# Patient Record
Sex: Female | Born: 1940
Health system: Southern US, Community
[De-identification: ages and names within clinical notes are randomized; demographics above are authoritative.]

## PROBLEM LIST (undated history)

## (undated) DIAGNOSIS — K56699 Other intestinal obstruction unspecified as to partial versus complete obstruction: Secondary | ICD-10-CM

## (undated) DIAGNOSIS — E119 Type 2 diabetes mellitus without complications: Secondary | ICD-10-CM

## (undated) DIAGNOSIS — M1990 Unspecified osteoarthritis, unspecified site: Secondary | ICD-10-CM

## (undated) DIAGNOSIS — E785 Hyperlipidemia, unspecified: Secondary | ICD-10-CM

## (undated) DIAGNOSIS — I10 Essential (primary) hypertension: Secondary | ICD-10-CM

## (undated) DIAGNOSIS — E079 Disorder of thyroid, unspecified: Secondary | ICD-10-CM

## (undated) DIAGNOSIS — Z7901 Long term (current) use of anticoagulants: Secondary | ICD-10-CM

## (undated) DIAGNOSIS — I480 Paroxysmal atrial fibrillation: Secondary | ICD-10-CM

## (undated) DIAGNOSIS — R55 Syncope and collapse: Secondary | ICD-10-CM

## (undated) DIAGNOSIS — I2699 Other pulmonary embolism without acute cor pulmonale: Secondary | ICD-10-CM

## (undated) HISTORY — PX: KNEE ARTHROSCOPY: SHX127

## (undated) HISTORY — DX: Unspecified osteoarthritis, unspecified site: M19.90

## (undated) HISTORY — DX: Other intestinal obstruction unspecified as to partial versus complete obstruction: K56.699

## (undated) HISTORY — DX: Hyperlipidemia, unspecified: E78.5

## (undated) HISTORY — PX: REDUCTION MAMMAPLASTY: SUR839

## (undated) HISTORY — DX: Other pulmonary embolism without acute cor pulmonale: I26.99

## (undated) HISTORY — DX: Paroxysmal atrial fibrillation: I48.0

## (undated) HISTORY — PX: SP ARTHRO HIP*L*: HXRAD239

## (undated) HISTORY — DX: Essential (primary) hypertension: I10

## (undated) HISTORY — DX: Long term (current) use of anticoagulants: Z79.01

## (undated) HISTORY — DX: Type 2 diabetes mellitus without complications: E11.9

---
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
## 2000-02-05 ENCOUNTER — Emergency Department (HOSPITAL_COMMUNITY): Admission: EM | Admit: 2000-02-05 | Discharge: 2000-02-05 | Payer: Self-pay | Admitting: Emergency Medicine

## 2000-08-21 ENCOUNTER — Encounter: Admission: RE | Admit: 2000-08-21 | Discharge: 2000-08-21 | Payer: Self-pay | Admitting: Family Medicine

## 2000-08-21 ENCOUNTER — Encounter: Payer: Self-pay | Admitting: Family Medicine

## 2000-10-26 ENCOUNTER — Encounter: Payer: Self-pay | Admitting: Internal Medicine

## 2000-10-26 ENCOUNTER — Ambulatory Visit (HOSPITAL_COMMUNITY): Admission: RE | Admit: 2000-10-26 | Discharge: 2000-10-26 | Payer: Self-pay | Admitting: Internal Medicine

## 2001-03-14 ENCOUNTER — Ambulatory Visit (HOSPITAL_COMMUNITY): Admission: RE | Admit: 2001-03-14 | Discharge: 2001-03-14 | Payer: Self-pay | Admitting: Internal Medicine

## 2001-03-17 ENCOUNTER — Emergency Department (HOSPITAL_COMMUNITY): Admission: EM | Admit: 2001-03-17 | Discharge: 2001-03-18 | Payer: Self-pay | Admitting: Emergency Medicine

## 2001-08-22 ENCOUNTER — Encounter: Admission: RE | Admit: 2001-08-22 | Discharge: 2001-08-22 | Payer: Self-pay | Admitting: Internal Medicine

## 2001-08-22 ENCOUNTER — Encounter: Payer: Self-pay | Admitting: Internal Medicine

## 2002-04-30 ENCOUNTER — Encounter: Payer: Self-pay | Admitting: Internal Medicine

## 2002-04-30 ENCOUNTER — Ambulatory Visit (HOSPITAL_COMMUNITY): Admission: RE | Admit: 2002-04-30 | Discharge: 2002-04-30 | Payer: Self-pay | Admitting: Internal Medicine

## 2003-02-20 ENCOUNTER — Encounter: Admission: RE | Admit: 2003-02-20 | Discharge: 2003-02-20 | Payer: Self-pay | Admitting: Internal Medicine

## 2003-04-21 ENCOUNTER — Ambulatory Visit (HOSPITAL_COMMUNITY): Admission: RE | Admit: 2003-04-21 | Discharge: 2003-04-21 | Payer: Self-pay | Admitting: Gastroenterology

## 2004-05-06 ENCOUNTER — Encounter: Admission: RE | Admit: 2004-05-06 | Discharge: 2004-05-06 | Payer: Self-pay | Admitting: Internal Medicine

## 2004-05-12 ENCOUNTER — Emergency Department (HOSPITAL_COMMUNITY): Admission: EM | Admit: 2004-05-12 | Discharge: 2004-05-12 | Payer: Self-pay | Admitting: Family Medicine

## 2005-06-17 ENCOUNTER — Encounter: Admission: RE | Admit: 2005-06-17 | Discharge: 2005-06-17 | Payer: Self-pay | Admitting: Internal Medicine

## 2006-02-22 ENCOUNTER — Emergency Department (HOSPITAL_COMMUNITY): Admission: EM | Admit: 2006-02-22 | Discharge: 2006-02-22 | Payer: Self-pay | Admitting: Emergency Medicine

## 2006-05-27 ENCOUNTER — Emergency Department (HOSPITAL_COMMUNITY): Admission: EM | Admit: 2006-05-27 | Discharge: 2006-05-27 | Payer: Self-pay | Admitting: Family Medicine

## 2006-06-22 ENCOUNTER — Encounter: Admission: RE | Admit: 2006-06-22 | Discharge: 2006-06-22 | Payer: Self-pay | Admitting: Internal Medicine

## 2007-06-25 ENCOUNTER — Encounter: Admission: RE | Admit: 2007-06-25 | Discharge: 2007-06-25 | Payer: Self-pay | Admitting: Internal Medicine

## 2008-03-07 ENCOUNTER — Inpatient Hospital Stay (HOSPITAL_COMMUNITY): Admission: RE | Admit: 2008-03-07 | Discharge: 2008-03-12 | Payer: Self-pay | Admitting: Orthopedic Surgery

## 2008-04-24 ENCOUNTER — Inpatient Hospital Stay (HOSPITAL_COMMUNITY): Admission: EM | Admit: 2008-04-24 | Discharge: 2008-04-26 | Payer: Self-pay | Admitting: Emergency Medicine

## 2008-06-25 ENCOUNTER — Encounter: Admission: RE | Admit: 2008-06-25 | Discharge: 2008-06-25 | Payer: Self-pay | Admitting: Internal Medicine

## 2009-07-23 ENCOUNTER — Encounter: Admission: RE | Admit: 2009-07-23 | Discharge: 2009-07-23 | Payer: Self-pay | Admitting: Internal Medicine

## 2010-01-24 ENCOUNTER — Encounter: Payer: Self-pay | Admitting: Internal Medicine

## 2010-04-14 LAB — GLUCOSE, CAPILLARY
Glucose-Capillary: 114 mg/dL — ABNORMAL HIGH (ref 70–99)
Glucose-Capillary: 116 mg/dL — ABNORMAL HIGH (ref 70–99)
Glucose-Capillary: 67 mg/dL — ABNORMAL LOW (ref 70–99)
Glucose-Capillary: 82 mg/dL (ref 70–99)
Glucose-Capillary: 92 mg/dL (ref 70–99)
Glucose-Capillary: 95 mg/dL (ref 70–99)

## 2010-04-14 LAB — COMPREHENSIVE METABOLIC PANEL
Alkaline Phosphatase: 55 U/L (ref 39–117)
BUN: 12 mg/dL (ref 6–23)
GFR calc Af Amer: >60 mL/min (ref >60)
Potassium: 3.9 mEq/L (ref 3.5–5.1)
Sodium: 137 mEq/L (ref 135–145)

## 2010-04-14 LAB — CBC
HCT: 29.9 % — ABNORMAL LOW (ref 36.0–46.0)
HCT: 36.9 % (ref 36.0–46.0)
Hemoglobin: 11.9 g/dL — ABNORMAL LOW (ref 12.0–15.0)
MCHC: 33.5 g/dL (ref 30.0–36.0)
MCV: 86 fL (ref 78.0–100.0)
MCV: 86.2 fL (ref 78.0–100.0)
Platelets: 314 10*3/uL (ref 150–400)
RBC: 3.48 MIL/uL — ABNORMAL LOW (ref 3.87–5.11)
RDW: 15.4 % (ref 11.5–15.5)

## 2010-04-14 LAB — BASIC METABOLIC PANEL
CO2: 25 mEq/L (ref 19–32)
Calcium: 9 mg/dL (ref 8.4–10.5)
Creatinine, Ser: 1.05 mg/dL (ref 0.4–1.2)
Potassium: 4.1 mEq/L (ref 3.5–5.1)

## 2010-04-14 LAB — POCT URINALYSIS DIP (DEVICE)
Bilirubin Urine: NEGATIVE
Hgb urine dipstick: NEGATIVE
Ketones, ur: NEGATIVE mg/dL
Protein, ur: 30 mg/dL — AB
Specific Gravity, Urine: 1.02 (ref 1.005–1.030)
pH: 8.5 — ABNORMAL HIGH (ref 5.0–8.0)

## 2010-04-14 LAB — DIFFERENTIAL
Basophils Absolute: 0.1 10*3/uL (ref 0.0–0.1)
Basophils Relative: 0 % (ref 0–1)
Eosinophils Absolute: 0.1 10*3/uL (ref 0.0–0.7)
Eosinophils Relative: 1 % (ref 0–5)
Lymphocytes Relative: 22 % (ref 12–46)
Lymphs Abs: 2.9 10*3/uL (ref 0.7–4.0)

## 2010-04-15 LAB — DIFFERENTIAL
Basophils Absolute: 0 10*3/uL (ref 0.0–0.1)
Basophils Relative: 0 % (ref 0–1)
Lymphocytes Relative: 37 % (ref 12–46)
Monocytes Absolute: 0.5 10*3/uL (ref 0.1–1.0)
Neutro Abs: 5.7 10*3/uL (ref 1.7–7.7)

## 2010-04-15 LAB — GLUCOSE, CAPILLARY
Glucose-Capillary: 102 mg/dL — ABNORMAL HIGH (ref 70–99)
Glucose-Capillary: 105 mg/dL — ABNORMAL HIGH (ref 70–99)
Glucose-Capillary: 107 mg/dL — ABNORMAL HIGH (ref 70–99)
Glucose-Capillary: 130 mg/dL — ABNORMAL HIGH (ref 70–99)
Glucose-Capillary: 141 mg/dL — ABNORMAL HIGH (ref 70–99)
Glucose-Capillary: 164 mg/dL — ABNORMAL HIGH (ref 70–99)
Glucose-Capillary: 169 mg/dL — ABNORMAL HIGH (ref 70–99)
Glucose-Capillary: 190 mg/dL — ABNORMAL HIGH (ref 70–99)
Glucose-Capillary: 24 mg/dL — CL (ref 70–99)
Glucose-Capillary: 60 mg/dL — ABNORMAL LOW (ref 70–99)
Glucose-Capillary: 64 mg/dL — ABNORMAL LOW (ref 70–99)
Glucose-Capillary: 72 mg/dL (ref 70–99)
Glucose-Capillary: 76 mg/dL (ref 70–99)
Glucose-Capillary: 79 mg/dL (ref 70–99)
Glucose-Capillary: 96 mg/dL (ref 70–99)

## 2010-04-15 LAB — COMPREHENSIVE METABOLIC PANEL
Alkaline Phosphatase: 54 U/L (ref 39–117)
BUN: 18 mg/dL (ref 6–23)
CO2: 27 mEq/L (ref 19–32)
Chloride: 104 mEq/L (ref 96–112)
Creatinine, Ser: 0.94 mg/dL (ref 0.4–1.2)
GFR calc non Af Amer: 60 mL/min — ABNORMAL LOW (ref >60)
Glucose, Bld: 128 mg/dL — ABNORMAL HIGH (ref 70–99)
Potassium: 4.3 mEq/L (ref 3.5–5.1)
Total Bilirubin: 0.8 mg/dL (ref 0.3–1.2)

## 2010-04-15 LAB — APTT: aPTT: 31 seconds (ref 24–37)

## 2010-04-15 LAB — BASIC METABOLIC PANEL
BUN: 15 mg/dL (ref 6–23)
BUN: 17 mg/dL (ref 6–23)
CO2: 25 mEq/L (ref 19–32)
Calcium: 8.5 mg/dL (ref 8.4–10.5)
Calcium: 8.7 mg/dL (ref 8.4–10.5)
Chloride: 103 mEq/L (ref 96–112)
Chloride: 103 mEq/L (ref 96–112)
Creatinine, Ser: 0.97 mg/dL (ref 0.4–1.2)
GFR calc Af Amer: >60 mL/min (ref >60)
GFR calc Af Amer: >60 mL/min (ref >60)
GFR calc non Af Amer: 46 mL/min — ABNORMAL LOW (ref >60)
GFR calc non Af Amer: 50 mL/min — ABNORMAL LOW (ref >60)
GFR calc non Af Amer: 56 mL/min — ABNORMAL LOW (ref >60)
Glucose, Bld: 79 mg/dL (ref 70–99)
Potassium: 4.2 mEq/L (ref 3.5–5.1)
Potassium: 4.9 mEq/L (ref 3.5–5.1)
Sodium: 133 mEq/L — ABNORMAL LOW (ref 135–145)
Sodium: 134 mEq/L — ABNORMAL LOW (ref 135–145)
Sodium: 137 mEq/L (ref 135–145)

## 2010-04-15 LAB — PROTIME-INR
INR: 1.6 — ABNORMAL HIGH (ref 0.00–1.49)
INR: 2 — ABNORMAL HIGH (ref 0.00–1.49)
INR: 2.3 — ABNORMAL HIGH (ref 0.00–1.49)
Prothrombin Time: 13.4 seconds (ref 11.6–15.2)
Prothrombin Time: 27.2 seconds — ABNORMAL HIGH (ref 11.6–15.2)
Prothrombin Time: 28.6 seconds — ABNORMAL HIGH (ref 11.6–15.2)

## 2010-04-15 LAB — URINALYSIS, ROUTINE W REFLEX MICROSCOPIC
Bilirubin Urine: NEGATIVE
Glucose, UA: NEGATIVE mg/dL
Ketones, ur: NEGATIVE mg/dL
Protein, ur: NEGATIVE mg/dL
pH: 5 (ref 5.0–8.0)

## 2010-04-15 LAB — URINE MICROSCOPIC-ADD ON

## 2010-04-15 LAB — CBC
HCT: 34.5 % — ABNORMAL LOW (ref 36.0–46.0)
HCT: 40.4 % (ref 36.0–46.0)
Hemoglobin: 10.1 g/dL — ABNORMAL LOW (ref 12.0–15.0)
Hemoglobin: 11 g/dL — ABNORMAL LOW (ref 12.0–15.0)
Hemoglobin: 11.4 g/dL — ABNORMAL LOW (ref 12.0–15.0)
Hemoglobin: 13.4 g/dL (ref 12.0–15.0)
MCHC: 33.6 g/dL (ref 30.0–36.0)
MCV: 87.3 fL (ref 78.0–100.0)
MCV: 87.7 fL (ref 78.0–100.0)
MCV: 88.4 fL (ref 78.0–100.0)
Platelets: 190 10*3/uL (ref 150–400)
Platelets: 199 10*3/uL (ref 150–400)
Platelets: 226 10*3/uL (ref 150–400)
RBC: 3.73 MIL/uL — ABNORMAL LOW (ref 3.87–5.11)
RBC: 3.9 MIL/uL (ref 3.87–5.11)
RBC: 4.61 MIL/uL (ref 3.87–5.11)
RDW: 14.4 % (ref 11.5–15.5)
RDW: 14.4 % (ref 11.5–15.5)
WBC: 10.2 10*3/uL (ref 4.0–10.5)
WBC: 11.4 10*3/uL — ABNORMAL HIGH (ref 4.0–10.5)
WBC: 13.3 10*3/uL — ABNORMAL HIGH (ref 4.0–10.5)

## 2010-04-15 LAB — TYPE AND SCREEN: Antibody Screen: NEGATIVE

## 2010-04-15 LAB — TSH: TSH: 0.659 u[IU]/mL (ref 0.350–4.500)

## 2010-04-15 LAB — ABO/RH: ABO/RH(D): O POS

## 2010-05-18 NOTE — Op Note (Signed)
Hannah [name], Hannah [name]                ACCOUNT NO.:  [id number]   MEDICAL RECORD NO.:  [id number]          PATIENT TYPE:  INP   LOCATION:  5023                         FACILITY:  [facility]   PHYSICIAN:  Harvie Junior, M.D.   DATE OF BIRTH:  1940/05/03   DATE OF PROCEDURE:  03/07/2008  DATE OF DISCHARGE:                               OPERATIVE REPORT   PREOPERATIVE DIAGNOSIS:  End-stage degenerative joint disease, left  knee.   POSTOPERATIVE DIAGNOSIS:  End-stage degenerative joint disease, left  knee.   PROCEDURE:  1. Left total knee replacement with Sigma system size 3 femur, size 3      tibia, 12.5-mm bridging bearing, and a 35-mm all-polyethylene      patella.  2. Computer-assisted left total knee replacement.   SURGEON:  Harvie Junior, MD.   ASSISTANT:  Marshia Ly, P.A.   ANESTHESIA:  General.   BRIEF HISTORY:  Hannah [name] is a 70 year old female with long history of  having had severe left degenerative joint disease.  She had a previous  left total hip replacement and previous right knee replacement.  She is  doing well except for the left knee hurting her.  We treated with  injection therapy, activity modification, anti-inflammatory medication,  aggressive care as well as conservative care.  She was taken to the  operating room for left total knee replacement.  Because of her total  hip, we knew that we could use intramedullary alignment until we felt  that computer assistance is going to be necessary to get appropriate  long alignment.  This was chosen to be used preoperatively.   PROCEDURE:  The patient was taken to the operating room.  After adequate  anesthesia was obtained with a general anesthetic, the patient was  placed supine on the operative table.  The left leg was prepped and  draped in the usual sterile fashion.  Following this, the leg was  exsanguinated.  Blood pressure tourniquet was inflated to 350 mmHg.  Following this, the midline incision was made  and subcutaneous tissue  dissected down to the level of the extensor mechanism.  Medial  parapatellar arthrotomy was undertaken.  Following this, the anterior  and posterior cruciates were excised as well as medial and lateral  meniscus and retropatellar fat pad.  The computer models were then  placed, 2 pins in the tibia, 2 pins in the femur and once this was done,  we did a registration process.  I had spent 30 minutes to the overall  surgical procedure.  Once the registration process was undertaken, we  cut the tibia perpendicular to its long axis and once this was  completed, the attention was then turned to the distal femur, which was  cut perpendicular to the anatomic axis.  This anatomic axis was found by  the computer assistance modules and once this was completed, attention  was turned towards placing a spacer block, which seemed a 10 easily went  into the knee.  At this point, attention was turned towards making the  cuts on the femur.  We did size it to  a 3 and anterior and posterior  cuts and chamfers, neutral rotation.  Once this was completed, attention  was turned towards the knee where the tibia was then sized, pinned and  keeled.  Following this, the trial of tibial component #3 was put in  place, 10-mm bridging bearing, size 3 femoral component, after the  blocks had previously been cut.  This gave excellent alignment.  Once  this was completed, there was a perfect neutral long alignment with the  guide system.  Once this was completed, attention was turned to the  patella, which was sized to a 35.  The patella was cut down to level of  14 mm and then the lugs were drilled for the patella.  Once this was  completed, the final components were put in place as well as the  polyethylene spacer and perfect neutral long alignment was achieved.  The trial components were then removed.  The knee was copiously and  thoroughly lavaged with a pulsatile lavage irrigation and final   components were cemented into place, size 3 tibia, size 3 femur, and a  10-mm bridging bearing trial was placed and seemed a little bit looser  with the final component, but this was placed to allow the cement to  harden completely, and the polyethylene patella was put in place and a  clamp was put in place.  Once this was completed, the cement was allowed  to hardened completely, and we then took a trial component out.  There  was a little bit of clean-up on the posterior aspect of the knee, behind  knee implant and then trialed with a 12.5 poly.  This actually seemed to  give still perfect full neutral long alignment and perfect neutral long  alignment and balance.  Once this was completed, the wound was copiously  and thoroughly irrigated and suctioned dry.  The tourniquet then let  down and all bleeders controlled with electrocautery.  Instilled 10 mL  of Marcaine with epinephrine in the posterior aspect of the knee to help  control bleeding and decrease pain.  The knee was then put through a  full range of motion without significant hold off of alignment.  The  medial parapatellar arthrotomy was closed with a #1 Vicryl running  suture, the skin with #0 and 2-0 Vicryl, and skin with staples.  Sterile  compressive dressing was applied, and the patient was taken to the  recovery room.  She was noted to be in a satisfactory condition.  Estimated blood loss for this procedure was less than 50 mL.      Harvie Junior, M.D.  Electronically Signed     JLG/MEDQ  D:  03/07/2008  T:  03/08/2008  Job:  [id number]

## 2010-05-18 NOTE — Discharge Summary (Signed)
NAMEEFRAT, ZUIDEMA                ACCOUNT NO.:  [id number]   MEDICAL RECORD NO.:  [id number]          PATIENT TYPE:  INP   LOCATION:  5023                         FACILITY:  [facility]   PHYSICIAN:  Harvie Junior, M.D.   DATE OF BIRTH:  June 06, 1940   DATE OF ADMISSION:  03/07/2008  DATE OF DISCHARGE:  03/12/2008                               DISCHARGE SUMMARY   ADMITTING DIAGNOSES:  1. End-stage degenerative joint disease left knee.  2. Type 2 diabetes mellitus.  3. Hypertension.  4. Hyperlipidemia.  5. Hypothyroidism.  6. History of deep venous thrombosis left lower extremity in past.   DISCHARGE DIAGNOSES:  1. End-stage degenerative joint disease left knee.  2. Type 2 diabetes mellitus.  3. Hypertension.  4. Hyperlipidemia.  5. Hypothyroidism.  6. Acute blood loss anemia.  7. Tachycardia.  8. History of deep venous thrombosis left lower extremity in the past.   HOSPITAL PROCEDURES:  Left total knee arthroplasty computer assisted  Harvie Junior, M.D. March 07, 2008.   HOSPITAL CONSULTATIONS:  Cardiology [facility].   BRIEF HISTORY:  Hannah [name] is a 70 year old female who has a long  history of left knee pain.  Standing x-rays of the left knee showed bone-  on-bone degenerative arthritis laterally with valgus angulation.  She  has gotten no relief with conservative treatment including injection  therapy, modification of activity and use of medication.  Based upon her  clinical and radiographic findings she is felt to be a candidate for a  left total knee arthroplasty and is admitted for this.   PERTINENT LABORATORY STUDIES:  CT angiography of the chest on March 11, 2008, showed no evidence of acute pulmonary embolism, mild bibasilar  linear atelectasis.  X-ray of the left hip showed no acute findings.  Preoperative chest x-ray on February 28, 2008, showed no active  cardiopulmonary disease.  EKG on February 28, 2008, showed normal sinus  rhythm  with left axis deviation and left ventricular hypertrophy with  repolarization abnormality.  No significant change since last tracing.  EKG on March 10, 2008, showed sinus tachycardia with left axis deviation,  minimal cultures and voltage criteria for LVH, may be normal variant.  Hemoglobin on admission was 13.4, hematocrit 40.4, WBC 10.2.  On postop  day 1 hemoglobin 11.4, postop day 2 was 10.1, postop day 3 is 10.0.  CMET on admission was within normal limits other than slightly elevated  glucose at 128.  This was followed on a daily basis and was found to be  within normal limits through the hospitalization.  The patient's pro-  time on admission was 13.4 seconds with an INR of 1.0, PTT of 31.  On  the day of discharge on Coumadin therapy orally her pro-time was 27.2  seconds with an INR of 2.3.  CBGs ranged from 60 to 119 during this  hospitalization.  She did have a CBG on March 8 of 49.  Also she had a  CBG up to 146 early in the hospitalization.  Preoperative urinalysis  showed few bacteria with 7-10 WBCs per  high-powered field.  CMET on  admission was within normal limits.  TSH on March 11, 2008, was 0.659.   HOSPITAL COURSE:  The patient was brought to the hospital where she  underwent left total knee arthroplasty as well described in Dr. Stacy Gardner  operative note.  Preoperatively she was given 2 grams of Ancef and 80 mg  of gentamicin was given, 1 gram Ancef q.8 h times 24 hours  postoperatively.  She was started on Coumadin for DVT prophylaxis  postop.  She did have a history of DVT since she was given 2 days with  subcu Lovenox until her INR was close to 2.0.  The patient was without  complaints.  She was on a PCA morphine pump and IV fluids.  CPM machine  was used and physical therapy was ordered for walker ambulation,  weightbearing as tolerated on the left.  On postop day #1 the patient  was without significant complaints other than moderate knee pain.  Her  hemoglobin was  stable at 13.4 and drain was in place.  Her Foley  catheter that was placed at the time of surgery was discontinued.  She  was gotten out of bed with physical therapy.  She was overall  comfortable.  She was taking fluids without difficulty.  Her dressing  was changed and her Hemovac drain was pulled.  Her INR was 1.6.  Her  potassium was 4.2.  She was continued with therapy.  On postop #3 she  was noted to be significantly tachycardic with a pulse of 114 up to  about 120.  She was given a liter of fluid bolus.  An EKG was obtained  which showed sinus tachycardia with no abnormalities.  She had some  complaints of some left hip pain and she was status post left total hip.  X-rays of the left hip were taken which were unremarkable.  She  continued with physical therapy on p.o. Coumadin.  She continued to be  tachycardic despite use of fluid bolus and a cardiology consult was  obtained.  This was greatly appreciated.  CT scan of the chest was done  which was negative for pulmonary embolus and she was started on a beta-  blocker.  A thyroid study was obtained as well which was within normal  limits.  The patient had no complaints of chest pain or shortness of  breath during this hospitalization but she continued to be significantly  tachycardic.  She improved with the use of beta blockade regarding her  heart rate and cardiology felt she was okay for discharge on March 12, 2008.  She was to go home on Lopressor 25 mg b.i.d.  Regarding her knee  her dressing was clean and dry at the time of discharge.  Her calf was  soft and nontender.  Her INR was 2.3.  She was discharged to a skilled  nursing facility in improved condition.  She will be on a diabetic diet.  Her activity status will be ambulating with a walker, weightbearing as  tolerated on the left.  She will do aggressive left knee range of motion  with the use of a CPM 8 hours per 24 hours with goal of flexion up to  120 degrees.    DISCHARGE MEDICATIONS:  1. Her medications at discharge will include metformin 500 mg one      b.i.d.  2. Lisinopril/HCTZ 20/25 mg one q.a.m.  3. Glipizide 5 mg b.i.d.  4. Levothyroxine 50 mcg q.a.m.  5.  Percocet 5 mg one to two q.6 h p.r.n. pain.  6. Simvastatin 20 mg one q. a.m.  7. Vitamin C 500 mg one q. a.m.  8. Metoprolol 25 mg 1 b.i.d.  9. Coumadin per pharmacy protocol shooting for an INR of 2.0.   FOLLOWUP:  She will need a followup with Dr. Luiz Blare in his office in 10  days.  Our office number is [phone].  Follow up with Dr. Lynnea Ferrier of  cardiology as needed and Dr. Concepcion Elk for followup in 3 weeks.      Marshia Ly, P.A.      Harvie Junior, M.D.  Electronically Signed    JB/MEDQ  D:  03/12/2008  T:  03/12/2008  Job:  [id number]   cc:   Ritta Slot, MD  Fleet Contras, M.D.

## 2010-05-18 NOTE — Discharge Summary (Signed)
[name], [name]                ACCOUNT NO.:  [id number]   MEDICAL RECORD NO.:  [id number]          PATIENT TYPE:  INP   LOCATION:  5151                         FACILITY:  [facility]   PHYSICIAN:  Fleet Contras, M.D.    DATE OF BIRTH:  1940/01/31   DATE OF ADMISSION:  04/24/2008  DATE OF DISCHARGE:  04/26/2008                               DISCHARGE SUMMARY   HISTORY OF PRESENT ILLNESS:  Hannah [name] is a 70 year old African  American lady with past medical history significant for systemic  hypertension, hypothyroidism, and degenerative joint disease of the  knees, status post recent left total knee replacement.  She was admitted  via the emergency room at [facility] where she presented with 3  days history of left lower quadrant abdominal pain, progressively  getting worse after an episode of alternating diarrhea and constipation,  and then she ate some meatballs from [employer].  She did not have any  fevers.  She had no nausea or vomiting.  She does have constipation  symptoms, were probably due to medication she was taking after her left  total knee replacement done on March 07, 2008.  At emergency room, she  was noted to be in moderate-to-severe painful distress.  She was not  pale.  She was not icteric.  She was not cyanosed.  Abdomen was tender  in the left lower quadrant.  CT scan of the abdomen with findings  suggestive of sigmoid diverticulitis.  She also had an elevated white  count of 13.6 and because of her severity of her pain, she was admitted  to the hospital for intravenous antibiotics and close monitoring.   HOSPITAL COURSE:  On admission, the patient was started on intravenous  fluids, intravenous morphine 1-2 mg q.4 h. for pain and intravenous  Zofran 4 mg q.4 h. p.r.n. for nausea and vomiting.  Her diet was slowly  advanced as tolerated.  She was started on intravenous antibiotics with  Rocephin 1 g q.24 h. and metronidazole 500 mg q.8 h.  She was on  Protonix 40  mg IV daily.  DVT prophylaxis with subcu Lovenox 40 mg  daily.  Her symptoms improved with resolution of her pain and she was  able to tolerate full diet.  Metformin was put on hold as result of her  recent contrast injection from CT scan of the abdomen and CBGs were  monitored a.c. and h.s. and covered with sliding scale NovoLog.  Today,  she is feeling much better.  She is tolerating a full diet.  She has had  no abdominal pain, nausea, or vomiting.  She has, however, not had a  bowel movement for 24 hours.  She has been given some Dulcolax 2 tablets  to assist her bowel movement.   PHYSICAL EXAMINATION:  VITAL SIGNS:  Stable.  Her blood pressure is  124/64, heart rate of 82, respiratory rate of 18, temperature 99.8, and  O2 sats on room air is 95%.  CHEST:  Clear to auscultation.  HEART:  Sounds 1 or 2 are heard.  No murmurs.  ABDOMEN:  Obese,  soft, and nontender.  No masses.  Bowel sounds are  present.  EXTREMITIES:  No edema or calf tenderness or swelling.  CNS:  She is alert and oriented x3 with no focal neurological deficits.   LABORATORY DATA:  On April 25, 2008, shows white count of 9.4,  hemoglobin 10, hematocrit 29.9, and platelet count of 255.  Sodium was  137, potassium 4.1, chloride 103, bicarbonate of 25, BUN 10, creatinine  1.05, and glucose of 87.  Ultrasound scan of the abdomen was performed  and was shown not to have any evidence of gallstones.  She had no  biliary duct dilatation, but she had the fatty liver.  She is now  considered stable for discharge home.   DISCHARGE DIAGNOSES:  Sigmoid diverticulitis, acute.  She actually had a  colonoscopy done about 5 years ago by Dr. Charna Elizabeth that showed  scattered diverticulosis.   CONDITION ON DISCHARGE:  Stable.   DISPOSITION:  For home.   DISCHARGE MEDICATIONS:  1. Flagyl 500 mg p.o. t.i.d.  2. Metformin 500 mg p.o. b.i.d.  3. Glucotrol 5 mg p.o. b.i.d.  4. Hydrochlorothiazide 25 mg p.o. daily.  5. Lopressor  25 mg p.o. b.i.d.  6. Prinivil 20 mg p.o. daily.  7. Synthroid 50 mcg p.o. daily.  8. Ceftin 500 mg p.o. b.i.d. for 7 days.  9. Trinsicon 1 capsule p.o. b.i.d.  10.Zocor 20 mg p.o. daily.  11.Percocet 5/325 one p.o. q.6 h. p.r.n. for pain.   This discharge plan was discussed with her and her daughter and their  questions answered.      Fleet Contras, M.D.  Electronically Signed     EA/MEDQ  D:  04/26/2008  T:  04/27/2008  Job:  [id number]

## 2010-05-18 NOTE — H&P (Signed)
Hannah [name], Hannah [name]                ACCOUNT NO.:  [id number]   MEDICAL RECORD NO.:  [id number]          PATIENT TYPE:  INP   LOCATION:  5151                         FACILITY:  [facility]   PHYSICIAN:  Fleet Contras, M.D.    DATE OF BIRTH:  1940-08-29   DATE OF ADMISSION:  04/24/2008  DATE OF DISCHARGE:                              HISTORY & PHYSICAL   PRESENTING COMPLAINT:  Left lower quadrant abdominal pain.   HISTORY OF PRESENT ILLNESS:  Hannah [name] is a 70 year old African  American lady with multiple medical problems including systemic  hypertension, type 2 diabetes mellitus, hypothyroidism, dyslipidemia,  degenerative arthritis of the hips and knees, deep venous thrombosis of  the left leg.  She came to the emergency room at [facility]  with 3-day history of progressively worsening recurrent left lower  quadrant abdominal pain.  She stated that the pain started after she ate  some meatballs she got from [employer], but she did not really have much  appetite but she did not have any vomiting.  She stated her bowels have  been moving regularly after she had some episodes of alternating  diarrhea and constipation while recuperating from her recent left knee  replacement surgery done on March 07, 2008.  While at the nursing home,  she did take  some milk of magnesia for constipation and that gave her  some diarrhea but that has resolved since she has been home where she  lives by herself.  She called her daughter when this pain started, and  she was advised to go to the Urgent Care where  she was sent to the  emergency room for further eval.  In the emergency room, she was noted  to be in severe left lower quadrant abdominal pain but no vomiting and  no diarrhea.  CT scan of the abdomen was performed and revealed findings  suggestive of sigmoid diverticulitis.  She also had a slightly elevated  white count of 13.6.  Because of the severity of her pain and  leukocytosis, it was thought  best for her to be admitted to the hospital  for close monitoring on intravenous antibiotic therapy.   PAST MEDICAL HISTORY:  1. Degenerative joint disease of the knees status post left knee total      hip replacement.  2. Type 2 diabetes mellitus.  3. Systemic hypertension.  4. Hyperlipidemia.  5. Hypothyroidism.  6. History of deep venous thrombosis of the left leg.  7. History of sinus tachycardia during her last admission for knee      replacement.   A CT scan of the chest was performed at that time and  pulmonary  embolism was ruled out.  She was then treated with beta-blocker by  Cardiology.   MEDICATION HISTORY:  She is on:  1. Metformin 500 mg b.i.d.  2. Lisinopril/hydrochlorothiazide 20/25 one p.o. daily.  3. Glipizide 5 mg b.i.d.  4. Levothyroxine 50 mcg q.a.m.  5. Simvastatin 20 mg q.a.m.  6. Metoprolol 25 mg b.i.d.  7. Lasix 20 mg daily.  8. Darvocet-N 100 one p.o. q.12 p.r.n.  9. Trinsicon  1 p.o. b.i.d.   ALLERGIES:  She has no known drug allergies.   PAST SURGICAL HISTORY:  Left hip replacement and left total knee  replacement.   FAMILY AND SOCIAL HISTORY:  She lives by herself.  She denies any use of  alcohol, tobacco, or illicit drugs.   REVIEW OF SYSTEMS:  Essentially as above.  CARDIOVASCULAR:  She has no  chest pain, shortness of breath, orthopnea, or PND.  RESPIRATORY:  She  has no cough, sputum production, or hemoptysis.  NEUROLOGIC:  She has no  syncope, seizures, slurring of speech, headaches, weakness of  extremities.  GU:  No dysuria, frequency, or hematuria.  MUSCULOSKELETAL:  She is currently receiving physical therapy status  post left total knee replacement and she went for therapy about 3 days  ago.   PHYSICAL EXAMINATION:  She is lying in hospital bed, currently not in  acute respiratory or painful distress.  She is not pale.  She is not  icteric.  She is well hydrated.  She is not cyanosed.  Vital signs shows  a blood pressure of  124/64, heart rate of 72, respiratory rate of 20,  temperature 98.4, O2 sats on room air is 98%.  Her chest is clear to  auscultation.  Heart sounds S1 and S2 were heard with no murmurs.  Abdomen is obese, soft.  He has tenderness over the left lower quadrant  with no guarding or rebound.  Bowel sounds are present.  Extremities  shows no edema, calf tenderness, or swelling.  There is evidence of  bilateral total knee replacement of both knees with mild swelling.  There is no obvious tenderness or deformity.   LABORATORY DATA:  White count is 13.6, hemoglobin 11.9, hematocrit 36.9,  platelet count of 314.  Sodium is 137, potassium 3.9, chloride 100,  bicarbonate of 24, BUN is 12, creatinine 0.96, glucose is 115, lipase is  16.  Liver function test is essentially normal.  Calcium is 9.6.  Urinalysis is negative for leukocyte esterase or nitrites.  CT scan of  the abdomen shows mild fatty infiltration of the liver, I cannot exclude  small gallstones.  CT scan of the pelvis shows probable mild  diverticulitis of the sigmoid colon.   ASSESSMENT:  Hannah [name] is a 70 year old African American lady  admitted via the emergency room with complaints of left lower quadrant  abdominal pain of 3 days' duration associated with alternating  constipation and diarrhea.  She has no fevers but has a mild  leukocytosis, admitted for intravenous antibiotics and close monitoring.   ADMISSION DIAGNOSES:  1. Acute sigmoid diverticulitis.  2. Abdominal pain.   PLAN OF CARE:  She will be placed on 2 g sodium carb-modified medium  diet, advanced as tolerated.  Vital signs q.4 hours, CBGs a.c. and h.s.,  monitor Is and Os.  Home medication will be continued as above.  1. IV fluids with normal saline at 35 mL an hour.  2. IV metronidazole 500  mg q.8 hours.  3. IV Rocephin 1 g q.24 hours.  4. IV Protonix 40 mg daily.  5. IV Zofran 4 mg q.4 hours p.r.n. for nausea and vomiting.  6. IV morphine 1-2 mg q.4  hours p.r.n. for pain.  7. Subcu Lovenox 40 mg daily for DVT prophylaxis.   Decision of care has been discussed with her and her questions answered.      Fleet Contras, M.D.  Electronically Signed     EA/MEDQ  D:  04/24/2008  T:  04/25/2008  Job:  [id number]

## 2010-05-21 NOTE — Op Note (Signed)
NAME:  Hannah [name], Hannah [name]                          ACCOUNT NO.:  [id number]   MEDICAL RECORD NO.:  [id number]                   PATIENT TYPE:  AMB   LOCATION:  ENDO                                 FACILITY:  [facility]   PHYSICIAN:  Anselmo Rod, M.D.               DATE OF BIRTH:  November 08, 1940   DATE OF PROCEDURE:  04/21/2003  DATE OF DISCHARGE:  04/21/2003                                 OPERATIVE REPORT   PROCEDURE PERFORMED:  Colonoscopy.   ENDOSCOPIST:  Charna Elizabeth, M.D.   INSTRUMENT USED:  Olympus video colonoscope.   INDICATIONS FOR PROCEDURE:  The patient is a 70 year old female undergoing  screening colonoscopy.  Rule out colonic polyps, masses, etc.   PREPROCEDURE PREPARATION:  Informed consent was procured from the patient.  The patient was fasted for eight hours prior to the procedure and prepped  with a bottle of magnesium citrate and a gallon of GoLYTELY the night prior  to the procedure.   PREPROCEDURE PHYSICAL:  The patient had stable vital signs.  Neck supple.  Chest clear to auscultation.  S1 and S2 regular.  Abdomen soft with normal  bowel sounds.   DESCRIPTION OF PROCEDURE:  The patient was placed in left lateral decubitus  position and sedated with 75 mg of Demerol and 7.5 mg of Versed  intravenously in slow incremental doses.  Once the patient was adequately  sedated and maintained on low flow oxygen and continuous cardiac monitoring,  the Olympus video colonoscope was advanced from the rectum to the cecum.  There was some residual stool in the colon and multiple washes were done.  Except for a few scattered diverticula, no other abnormalities were noted.  No masses or polyps were present.  The patient tolerated the procedure well  without immediate complications.   IMPRESSION:  1. Few scattered diverticula, otherwise unrevealing colonoscopy.  2. There was some residual stool in the colon and small lesions could have     been missed.   RECOMMENDATIONS:  1.  Continue high fiber diet with liberal fluid intake.  2. Repeat colorectal cancer screening is recommended in the next 10 years     unless the patient develops any abnormal symptoms in the interim.  3. Outpatient followup as need arises in the future.                                               Anselmo Rod, M.D.    JNM/MEDQ  D:  04/28/2003  T:  04/28/2003  Job:  [id number]   cc:   Olene Craven, M.D.  [address]  [address]  [place]  [place] [address]  Fax: [phone]

## 2010-08-23 ENCOUNTER — Other Ambulatory Visit: Payer: Self-pay | Admitting: Cardiovascular Disease

## 2010-08-23 ENCOUNTER — Other Ambulatory Visit: Payer: Self-pay | Admitting: Internal Medicine

## 2010-08-23 DIAGNOSIS — Z1231 Encounter for screening mammogram for malignant neoplasm of breast: Secondary | ICD-10-CM

## 2010-09-15 ENCOUNTER — Ambulatory Visit
Admission: RE | Admit: 2010-09-15 | Discharge: 2010-09-15 | Disposition: A | Discharge status: Home / Self Care | Payer: MEDICARE | Source: Ambulatory Visit | Attending: Cardiovascular Disease | Admitting: Cardiovascular Disease

## 2010-09-15 DIAGNOSIS — Z1231 Encounter for screening mammogram for malignant neoplasm of breast: Secondary | ICD-10-CM

## 2011-09-08 ENCOUNTER — Other Ambulatory Visit: Payer: Self-pay | Admitting: Cardiovascular Disease

## 2011-09-08 DIAGNOSIS — Z1231 Encounter for screening mammogram for malignant neoplasm of breast: Secondary | ICD-10-CM

## 2011-10-04 ENCOUNTER — Ambulatory Visit
Admission: RE | Admit: 2011-10-04 | Discharge: 2011-10-04 | Disposition: A | Discharge status: Home / Self Care | Payer: Medicare Other | Source: Ambulatory Visit | Attending: Cardiovascular Disease | Admitting: Cardiovascular Disease

## 2011-10-04 DIAGNOSIS — Z1231 Encounter for screening mammogram for malignant neoplasm of breast: Secondary | ICD-10-CM

## 2012-07-13 ENCOUNTER — Emergency Department (HOSPITAL_COMMUNITY): Payer: Medicare Other

## 2012-07-13 ENCOUNTER — Encounter (HOSPITAL_COMMUNITY): Payer: Self-pay | Admitting: Emergency Medicine

## 2012-07-13 ENCOUNTER — Inpatient Hospital Stay (HOSPITAL_COMMUNITY)
Admission: EM | Admit: 2012-07-13 | Discharge: 2012-07-26 | DRG: 166 | Disposition: A | Discharge status: Home Health | Payer: Medicare Other | Attending: Emergency Medicine | Admitting: Emergency Medicine

## 2012-07-13 DIAGNOSIS — I469 Cardiac arrest, cause unspecified: Secondary | ICD-10-CM | POA: Diagnosis present

## 2012-07-13 DIAGNOSIS — I824Y9 Acute embolism and thrombosis of unspecified deep veins of unspecified proximal lower extremity: Secondary | ICD-10-CM | POA: Diagnosis present

## 2012-07-13 DIAGNOSIS — J9600 Acute respiratory failure, unspecified whether with hypoxia or hypercapnia: Secondary | ICD-10-CM | POA: Diagnosis present

## 2012-07-13 DIAGNOSIS — J9601 Acute respiratory failure with hypoxia: Secondary | ICD-10-CM

## 2012-07-13 DIAGNOSIS — R578 Other shock: Secondary | ICD-10-CM | POA: Diagnosis present

## 2012-07-13 DIAGNOSIS — E162 Hypoglycemia, unspecified: Secondary | ICD-10-CM

## 2012-07-13 DIAGNOSIS — I10 Essential (primary) hypertension: Secondary | ICD-10-CM | POA: Diagnosis present

## 2012-07-13 DIAGNOSIS — R092 Respiratory arrest: Secondary | ICD-10-CM

## 2012-07-13 DIAGNOSIS — Z86718 Personal history of other venous thrombosis and embolism: Secondary | ICD-10-CM

## 2012-07-13 DIAGNOSIS — E876 Hypokalemia: Secondary | ICD-10-CM | POA: Diagnosis not present

## 2012-07-13 DIAGNOSIS — I2699 Other pulmonary embolism without acute cor pulmonale: Secondary | ICD-10-CM

## 2012-07-13 DIAGNOSIS — J9819 Other pulmonary collapse: Secondary | ICD-10-CM | POA: Diagnosis present

## 2012-07-13 DIAGNOSIS — D696 Thrombocytopenia, unspecified: Secondary | ICD-10-CM | POA: Diagnosis present

## 2012-07-13 DIAGNOSIS — E669 Obesity, unspecified: Secondary | ICD-10-CM | POA: Diagnosis present

## 2012-07-13 DIAGNOSIS — N179 Acute kidney failure, unspecified: Secondary | ICD-10-CM

## 2012-07-13 DIAGNOSIS — R5381 Other malaise: Secondary | ICD-10-CM | POA: Diagnosis present

## 2012-07-13 DIAGNOSIS — K7200 Acute and subacute hepatic failure without coma: Secondary | ICD-10-CM | POA: Diagnosis present

## 2012-07-13 DIAGNOSIS — E119 Type 2 diabetes mellitus without complications: Secondary | ICD-10-CM

## 2012-07-13 DIAGNOSIS — G9340 Encephalopathy, unspecified: Secondary | ICD-10-CM | POA: Diagnosis present

## 2012-07-13 DIAGNOSIS — J384 Edema of larynx: Secondary | ICD-10-CM | POA: Diagnosis not present

## 2012-07-13 DIAGNOSIS — R791 Abnormal coagulation profile: Secondary | ICD-10-CM | POA: Diagnosis present

## 2012-07-13 DIAGNOSIS — E039 Hypothyroidism, unspecified: Secondary | ICD-10-CM | POA: Diagnosis present

## 2012-07-13 DIAGNOSIS — Z7901 Long term (current) use of anticoagulants: Secondary | ICD-10-CM

## 2012-07-13 DIAGNOSIS — I369 Nonrheumatic tricuspid valve disorder, unspecified: Secondary | ICD-10-CM

## 2012-07-13 DIAGNOSIS — E872 Acidosis, unspecified: Secondary | ICD-10-CM | POA: Diagnosis present

## 2012-07-13 DIAGNOSIS — J4489 Other specified chronic obstructive pulmonary disease: Secondary | ICD-10-CM | POA: Diagnosis present

## 2012-07-13 DIAGNOSIS — Z7982 Long term (current) use of aspirin: Secondary | ICD-10-CM

## 2012-07-13 DIAGNOSIS — Z79899 Other long term (current) drug therapy: Secondary | ICD-10-CM

## 2012-07-13 DIAGNOSIS — R570 Cardiogenic shock: Secondary | ICD-10-CM

## 2012-07-13 DIAGNOSIS — D62 Acute posthemorrhagic anemia: Secondary | ICD-10-CM | POA: Diagnosis present

## 2012-07-13 DIAGNOSIS — J449 Chronic obstructive pulmonary disease, unspecified: Secondary | ICD-10-CM | POA: Diagnosis present

## 2012-07-13 DIAGNOSIS — M7981 Nontraumatic hematoma of soft tissue: Secondary | ICD-10-CM | POA: Diagnosis not present

## 2012-07-13 DIAGNOSIS — E1169 Type 2 diabetes mellitus with other specified complication: Secondary | ICD-10-CM | POA: Diagnosis not present

## 2012-07-13 HISTORY — DX: Disorder of thyroid, unspecified: E07.9

## 2012-07-13 HISTORY — DX: Other pulmonary embolism without acute cor pulmonale: I26.99

## 2012-07-13 LAB — FIBRINOGEN: Fibrinogen: <60 mg/dL — CL (ref 204–475)

## 2012-07-13 LAB — PROTIME-INR
INR: 2.17 — ABNORMAL HIGH (ref 0.00–1.49)
INR: 4.26 — ABNORMAL HIGH (ref 0.00–1.49)
INR: >10 (ref 0.00–1.49)
Prothrombin Time: >90 seconds — ABNORMAL HIGH (ref 11.6–15.2)
Prothrombin Time: 79.7 seconds — ABNORMAL HIGH (ref 11.6–15.2)
Prothrombin Time: 27.8 seconds — ABNORMAL HIGH (ref 11.6–15.2)

## 2012-07-13 LAB — CBC
HCT: 28.5 % — ABNORMAL LOW (ref 36.0–46.0)
Hemoglobin: 9.4 g/dL — ABNORMAL LOW (ref 12.0–15.0)
MCH: 28.4 pg (ref 26.0–34.0)
MCHC: 33 g/dL (ref 30.0–36.0)
Platelets: 128 10*3/uL — ABNORMAL LOW (ref 150–400)
RBC: 3.81 MIL/uL — ABNORMAL LOW (ref 3.87–5.11)
RBC: 3.31 MIL/uL — ABNORMAL LOW (ref 3.87–5.11)
RDW: 14.7 % (ref 11.5–15.5)
WBC: 19.6 10*3/uL — ABNORMAL HIGH (ref 4.0–10.5)

## 2012-07-13 LAB — COMPREHENSIVE METABOLIC PANEL
BUN: 24 mg/dL — ABNORMAL HIGH (ref 6–23)
CO2: 16 mEq/L — ABNORMAL LOW (ref 19–32)
Calcium: 8.8 mg/dL (ref 8.4–10.5)
Chloride: 100 mEq/L (ref 96–112)
Creatinine, Ser: 1.56 mg/dL — ABNORMAL HIGH (ref 0.50–1.10)
GFR calc Af Amer: 37 mL/min — ABNORMAL LOW (ref >90)
GFR calc non Af Amer: 32 mL/min — ABNORMAL LOW (ref >90)
Glucose, Bld: 334 mg/dL — ABNORMAL HIGH (ref 70–99)
Total Bilirubin: 0.4 mg/dL (ref 0.3–1.2)

## 2012-07-13 LAB — CBC WITH DIFFERENTIAL/PLATELET
Basophils Relative: 0 % (ref 0–1)
Eosinophils Relative: 1 % (ref 0–5)
HCT: 19.3 % — ABNORMAL LOW (ref 36.0–46.0)
Lymphs Abs: 3.5 10*3/uL (ref 0.7–4.0)
MCH: 27.7 pg (ref 26.0–34.0)
MCV: 90.6 fL (ref 78.0–100.0)
Monocytes Absolute: 0.3 10*3/uL (ref 0.1–1.0)
Neutro Abs: 4.1 10*3/uL (ref 1.7–7.7)
Platelets: 55 10*3/uL — ABNORMAL LOW (ref 150–400)
RBC: 2.13 MIL/uL — ABNORMAL LOW (ref 3.87–5.11)

## 2012-07-13 LAB — POCT I-STAT, CHEM 8
Chloride: 106 mEq/L (ref 96–112)
Glucose, Bld: 316 mg/dL — ABNORMAL HIGH (ref 70–99)
HCT: 37 % (ref 36.0–46.0)
Hemoglobin: 12.6 g/dL (ref 12.0–15.0)
Potassium: 4.3 mEq/L (ref 3.5–5.1)

## 2012-07-13 LAB — POCT I-STAT 3, VENOUS BLOOD GAS (G3P V)
Acid-base deficit: 18 mmol/L — ABNORMAL HIGH (ref 0.0–2.0)
pH, Ven: 6.955 — CL (ref 7.250–7.300)

## 2012-07-13 LAB — CG4 I-STAT (LACTIC ACID): Lactic Acid, Venous: 2.88 mmol/L — ABNORMAL HIGH (ref 0.5–2.2)

## 2012-07-13 LAB — GLUCOSE, CAPILLARY
Glucose-Capillary: 320 mg/dL — ABNORMAL HIGH (ref 70–99)
Glucose-Capillary: 367 mg/dL — ABNORMAL HIGH (ref 70–99)

## 2012-07-13 LAB — APTT
aPTT: 54 seconds — ABNORMAL HIGH (ref 24–37)
aPTT: 68 seconds — ABNORMAL HIGH (ref 24–37)
aPTT: 146 seconds — ABNORMAL HIGH (ref 24–37)

## 2012-07-13 LAB — TROPONIN I: Troponin I: <0.3 ng/mL (ref <0.30)

## 2012-07-13 LAB — LACTIC ACID, PLASMA: Lactic Acid, Venous: 9.4 mmol/L — ABNORMAL HIGH (ref 0.5–2.2)

## 2012-07-13 LAB — PREPARE RBC (CROSSMATCH)

## 2012-07-13 MED ORDER — FENTANYL BOLUS VIA INFUSION
25.0000 ug | Freq: Four times a day (QID) | INTRAVENOUS | Status: DC | PRN
  Filled 2012-07-13: qty 100

## 2012-07-13 MED ORDER — MIDAZOLAM HCL 2 MG/2ML IJ SOLN
2.0000 mg | Freq: Once | INTRAMUSCULAR | Status: AC
  Administered 2012-07-13: 2 mg via INTRAVENOUS

## 2012-07-13 MED ORDER — MIDAZOLAM HCL 2 MG/2ML IJ SOLN
INTRAMUSCULAR | Status: AC
  Administered 2012-07-13: 3 mg
  Filled 2012-07-13: qty 4

## 2012-07-13 MED ORDER — ATROPINE SULFATE 0.1 MG/ML IJ SOLN
0.5000 mg | Freq: Once | INTRAMUSCULAR | Status: AC
  Administered 2012-07-13: 0.5 mg via INTRAVENOUS

## 2012-07-13 MED ORDER — SODIUM CHLORIDE 0.9 % IV SOLN: Freq: Once | INTRAVENOUS | Status: DC

## 2012-07-13 MED ORDER — CALCIUM GLUCONATE 10 % IV SOLN: 1.0000 g | Freq: Once | INTRAVENOUS | Status: DC

## 2012-07-13 MED ORDER — SODIUM CHLORIDE 0.9 % IV SOLN
25.0000 ug/h | INTRAVENOUS | Status: DC
  Administered 2012-07-14: 150 ug/h via INTRAVENOUS
  Filled 2012-07-13: qty 50

## 2012-07-13 MED ORDER — MORPHINE SULFATE 2 MG/ML IJ SOLN: 2.0000 mg | Freq: Once | INTRAMUSCULAR | Status: AC

## 2012-07-13 MED ORDER — SODIUM CHLORIDE 0.9 % IV BOLUS (SEPSIS): 1000.0000 mL | Freq: Once | INTRAVENOUS | Status: DC

## 2012-07-13 MED ORDER — MIDAZOLAM HCL 2 MG/2ML IJ SOLN
INTRAMUSCULAR | Status: AC
  Filled 2012-07-13: qty 2

## 2012-07-13 MED ORDER — MIDAZOLAM HCL 2 MG/2ML IJ SOLN
1.0000 mg | INTRAMUSCULAR | Status: DC | PRN
  Administered 2012-07-14 – 2012-07-16 (×3): 2 mg via INTRAVENOUS
  Filled 2012-07-13 (×3): qty 2

## 2012-07-13 MED ORDER — DOBUTAMINE-DEXTROSE 2-5 MG/ML-% IV SOLN
5.0000 ug/kg/min | INTRAVENOUS | Status: DC
  Filled 2012-07-13 (×2): qty 250

## 2012-07-13 MED ORDER — IOHEXOL 350 MG/ML SOLN
80.0000 mL | Freq: Once | INTRAVENOUS | Status: AC | PRN
  Administered 2012-07-13: 80 mL via INTRAVENOUS

## 2012-07-13 MED ORDER — DEXTROSE 5 % IV SOLN
INTRAVENOUS | Status: AC
  Filled 2012-07-13: qty 250

## 2012-07-13 MED ORDER — MAGNESIUM SULFATE 40 MG/ML IJ SOLN
2.0000 g | Freq: Once | INTRAMUSCULAR | Status: AC
  Administered 2012-07-13: 2 g via INTRAVENOUS

## 2012-07-13 MED ORDER — FENTANYL CITRATE 0.05 MG/ML IJ SOLN: 25.0000 ug | INTRAMUSCULAR | Status: DC | PRN

## 2012-07-13 MED ORDER — TENECTEPLASE 50 MG IV KIT
45.0000 mg | PACK | INTRAVENOUS | Status: AC
  Administered 2012-07-13: 45 mg via INTRAVENOUS
  Filled 2012-07-13: qty 10

## 2012-07-13 MED ORDER — FAMOTIDINE IN NACL 20-0.9 MG/50ML-% IV SOLN
20.0000 mg | Freq: Two times a day (BID) | INTRAVENOUS | Status: DC
  Administered 2012-07-13 – 2012-07-18 (×10): 20 mg via INTRAVENOUS
  Filled 2012-07-13 (×12): qty 50

## 2012-07-13 MED ORDER — MIDAZOLAM HCL 2 MG/2ML IJ SOLN
2.0000 mg | INTRAMUSCULAR | Status: DC | PRN
  Administered 2012-07-13: 2 mg via INTRAVENOUS
  Filled 2012-07-13: qty 2

## 2012-07-13 MED ORDER — SODIUM BICARBONATE 8.4 % IV SOLN
50.0000 meq | Freq: Once | INTRAVENOUS | Status: AC
  Administered 2012-07-13: 50 meq via INTRAVENOUS
  Filled 2012-07-13: qty 100

## 2012-07-13 MED ORDER — SODIUM CHLORIDE 0.9 % IV SOLN: INTRAVENOUS | Status: DC

## 2012-07-13 MED ORDER — NOREPINEPHRINE BITARTRATE 1 MG/ML IJ SOLN
2.0000 ug/min | INTRAVENOUS | Status: DC
  Administered 2012-07-13: 40 ug/min via INTRAVENOUS
  Administered 2012-07-13: 45 ug/min via INTRAVENOUS
  Administered 2012-07-14: 40 ug/min via INTRAVENOUS
  Administered 2012-07-15: 30 ug/min via INTRAVENOUS
  Filled 2012-07-13 (×7): qty 16

## 2012-07-13 MED ORDER — SUCCINYLCHOLINE CHLORIDE 20 MG/ML IJ SOLN
INTRAMUSCULAR | Status: AC
  Filled 2012-07-13: qty 1

## 2012-07-13 MED ORDER — HEPARIN (PORCINE) IN NACL 100-0.45 UNIT/ML-% IJ SOLN
850.0000 [IU]/h | INTRAMUSCULAR | Status: DC
  Administered 2012-07-13: 1000 [IU]/h via INTRAVENOUS
  Filled 2012-07-13 (×2): qty 250

## 2012-07-13 MED ORDER — LORAZEPAM 2 MG/ML IJ SOLN
INTRAMUSCULAR | Status: AC
  Administered 2012-07-13: 2 mg via INTRAVENOUS
  Filled 2012-07-13: qty 1

## 2012-07-13 MED ORDER — ROCURONIUM BROMIDE 50 MG/5ML IV SOLN
INTRAVENOUS | Status: AC
  Filled 2012-07-13: qty 2

## 2012-07-13 MED ORDER — ONDANSETRON 8 MG PO TBDP: 8.0000 mg | ORAL_TABLET | Freq: Once | ORAL | Status: DC

## 2012-07-13 MED ORDER — IOHEXOL 350 MG/ML SOLN: 100.0000 mL | Freq: Once | INTRAVENOUS | Status: DC | PRN

## 2012-07-13 MED ORDER — DEXTROSE 5 % IV SOLN
INTRAVENOUS | Status: DC
  Administered 2012-07-13 – 2012-07-14 (×3): via INTRAVENOUS
  Filled 2012-07-13 (×6): qty 150

## 2012-07-13 MED ORDER — EPINEPHRINE HCL 0.1 MG/ML IJ SOSY
1.0000 mg | PREFILLED_SYRINGE | Freq: Once | INTRAMUSCULAR | Status: AC
  Administered 2012-07-13: 1 mg via INTRAVENOUS

## 2012-07-13 MED ORDER — SODIUM CHLORIDE 0.9 % IV SOLN
250.0000 mL | INTRAVENOUS | Status: DC | PRN
  Administered 2012-07-13 – 2012-07-22 (×3): 250 mL via INTRAVENOUS

## 2012-07-13 MED ORDER — SODIUM BICARBONATE 8.4 % IV SOLN
50.0000 meq | Freq: Once | INTRAVENOUS | Status: AC
  Administered 2012-07-13: 50 meq via INTRAVENOUS

## 2012-07-13 MED ORDER — SODIUM CHLORIDE 0.9 % IV SOLN
25.0000 ug/h | INTRAVENOUS | Status: DC
  Administered 2012-07-13: 50 ug/h via INTRAVENOUS
  Filled 2012-07-13: qty 50

## 2012-07-13 MED ORDER — POTASSIUM CHLORIDE 10 MEQ/100ML IV SOLN: 10.0000 meq | Freq: Once | INTRAVENOUS | Status: DC

## 2012-07-13 MED ORDER — SODIUM CHLORIDE 0.9 % IV SOLN
INTRAVENOUS | Status: DC
  Administered 2012-07-13: 2.7 [IU]/h via INTRAVENOUS
  Administered 2012-07-14: 17.6 [IU]/h via INTRAVENOUS
  Administered 2012-07-14: 3.3 [IU]/h via INTRAVENOUS
  Filled 2012-07-13 (×3): qty 1

## 2012-07-13 MED ORDER — LEVOTHYROXINE SODIUM 100 MCG IV SOLR
25.0000 ug | Freq: Every day | INTRAVENOUS | Status: DC
  Administered 2012-07-14 – 2012-07-18 (×5): 25 ug via INTRAVENOUS
  Filled 2012-07-13 (×5): qty 5

## 2012-07-13 MED ORDER — CALCIUM CHLORIDE 10 % IV SOLN
1.0000 g | Freq: Once | INTRAVENOUS | Status: AC
  Administered 2012-07-13: 1 g via INTRAVENOUS

## 2012-07-13 MED ORDER — POTASSIUM CHLORIDE 10 MEQ/100ML IV SOLN
INTRAVENOUS | Status: AC
  Filled 2012-07-13: qty 100

## 2012-07-13 MED ORDER — NOREPINEPHRINE BITARTRATE 1 MG/ML IJ SOLN
2.0000 ug/min | Freq: Once | INTRAVENOUS | Status: AC
  Administered 2012-07-13: 10 ug/min via INTRAVENOUS
  Filled 2012-07-13: qty 4

## 2012-07-13 MED ORDER — MIDAZOLAM HCL 10 MG/2ML IJ SOLN: 3.0000 mg | Freq: Once | INTRAMUSCULAR | Status: AC

## 2012-07-13 MED ORDER — ONDANSETRON HCL 4 MG/2ML IJ SOLN
4.0000 mg | Freq: Once | INTRAMUSCULAR | Status: AC
  Administered 2012-07-13: 4 mg via INTRAVENOUS
  Filled 2012-07-13: qty 2

## 2012-07-13 MED ORDER — INSULIN ASPART 100 UNIT/ML ~~LOC~~ SOLN
2.0000 [IU] | SUBCUTANEOUS | Status: DC
Start: 2012-07-13 — End: 2012-07-13

## 2012-07-13 MED ORDER — MORPHINE SULFATE 2 MG/ML IJ SOLN
INTRAMUSCULAR | Status: AC
  Administered 2012-07-13: 2 mg via INTRAVENOUS
  Filled 2012-07-13: qty 1

## 2012-07-13 MED ORDER — SODIUM CHLORIDE 0.9 % IV SOLN: 1.0000 g | Freq: Once | INTRAVENOUS | Status: DC

## 2012-07-13 MED ORDER — FENTANYL CITRATE 0.05 MG/ML IJ SOLN
100.0000 ug | Freq: Once | INTRAMUSCULAR | Status: AC
  Administered 2012-07-13: 100 ug via INTRAVENOUS
  Filled 2012-07-13: qty 2

## 2012-07-13 MED ORDER — NOREPINEPHRINE BITARTRATE 1 MG/ML IJ SOLN
INTRAMUSCULAR | Status: AC
Start: 2012-07-13 — End: 2012-07-13
  Filled 2012-07-13: qty 4

## 2012-07-13 MED ORDER — LIDOCAINE HCL (CARDIAC) 20 MG/ML IV SOLN
INTRAVENOUS | Status: AC
  Filled 2012-07-13: qty 5

## 2012-07-13 MED ORDER — SODIUM CHLORIDE 0.9 % IV SOLN
INTRAVENOUS | Status: DC
  Administered 2012-07-16: 1000 mL via INTRAVENOUS

## 2012-07-13 MED ORDER — SODIUM CHLORIDE 0.9 % IV BOLUS (SEPSIS)
1000.0000 mL | Freq: Once | INTRAVENOUS | Status: AC
  Administered 2012-07-13: 1000 mL via INTRAVENOUS

## 2012-07-13 MED ORDER — VASOPRESSIN 20 UNIT/ML IJ SOLN
0.0300 [IU]/min | INTRAVENOUS | Status: DC
  Administered 2012-07-13: 0.03 [IU]/min via INTRAVENOUS
  Filled 2012-07-13 (×2): qty 2.5

## 2012-07-13 MED ORDER — ETOMIDATE 2 MG/ML IV SOLN
INTRAVENOUS | Status: AC
  Filled 2012-07-13: qty 20

## 2012-07-13 MED ORDER — EPINEPHRINE HCL 0.1 MG/ML IJ SOSY
PREFILLED_SYRINGE | INTRAMUSCULAR | Status: AC | PRN
  Administered 2012-07-13 (×3): 1 mg via INTRAVENOUS

## 2012-07-13 MED ORDER — LORAZEPAM 2 MG/ML IJ SOLN: 2.0000 mg | Freq: Once | INTRAMUSCULAR | Status: AC

## 2012-07-13 NOTE — ED Provider Notes (Addendum)
History    CSN: [id number] Arrival date & time 07/13/12  5638  First MD Initiated Contact with Patient 07/13/12 0931     No chief complaint on file.  (Consider location/radiation/quality/duration/timing/severity/associated sxs/prior Treatment) HPI Comments: Pt with hx of HTN, HL, DM comes in with cc of feeling sick. Pt states that she was doing some house chores, when she started feeling sick, feeling chest palpitations and passed out. She has had no chest pains, but does feel dyspneic, and was diaphoretic, nauseated.  Pt has hx of DVT, not on coumadin.  The history is provided by the patient, medical records and the EMS personnel.   Past Medical History  Diagnosis Date  . Thyroid disease   . Diabetes mellitus without complication   . DVT (deep venous thrombosis)    No past surgical history on file. No family history on file. History  Substance Use Topics  . Smoking status: Not on file  . Smokeless tobacco: Not on file  . Alcohol Use: Not on file   OB History   Grav Para Term Preterm Abortions TAB SAB Ect Mult Living                 Review of Systems  Constitutional: Positive for diaphoresis, activity change and fatigue.  HENT: Negative for neck pain.   Eyes: Negative for visual disturbance.  Respiratory: Negative for cough, chest tightness, shortness of breath and wheezing.   Cardiovascular: Positive for palpitations. Negative for chest pain.  Gastrointestinal: Negative for nausea, vomiting and abdominal pain.  Genitourinary: Negative for dysuria.  Neurological: Positive for syncope. Negative for headaches.  Psychiatric/Behavioral: Negative for confusion.    Allergies  Review of patient's allergies indicates no known allergies.  Home Medications  No current outpatient prescriptions on file. BP 148/84  Pulse 140  Resp 27  SpO2 100% Physical Exam  Nursing note and vitals reviewed. Constitutional: She is oriented to person, place, and time. She appears  well-developed and well-nourished.  HENT:  Head: Normocephalic and atraumatic.  Eyes: EOM are normal. Pupils are equal, round, and reactive to light.  Neck: Neck supple. No JVD present.  Cardiovascular: Normal rate, regular rhythm and normal heart sounds.   No murmur heard. Pulmonary/Chest: She is in respiratory distress. She has no wheezes.  tachypneic  Abdominal: Soft. She exhibits no distension. There is no tenderness. There is no rebound and no guarding.  Neurological: She is alert and oriented to person, place, and time.  Skin: Skin is warm. She is diaphoretic.    ED Course  Procedures (including critical care time) Labs Reviewed  CBC WITH DIFFERENTIAL  PRO B NATRIURETIC PEPTIDE  PROTIME-INR  APTT  URINALYSIS, ROUTINE W REFLEX MICROSCOPIC  TROPONIN I   No results found. No diagnosis found.  MDM   Date: 07/13/2012  Rate: 139  Rhythm: sinus tachycardia  QRS Axis: left  Intervals: normal  ST/T Wave abnormalities: ST elevations anteriorly  Conduction Disutrbances:left bundle branch block  Narrative Interpretation:   Old EKG Reviewed: changes noted Except for qrs being wide, has other features for LBBB. PT has ST elevation anterior leads.  Differential diagnosis includes: ACS syndrome PE CHF exacerbation Valvular disorder Myocarditis Pericarditis Pericardial effusion Pneumonia Pleural effusion Pulmonary edema PE Musculoskeletal pain Orthostatic hypotension Stroke Vertebral artery dissection/stenosis Dysrhythmia Vasovagal/neurocardiogenic syncope Aortic stenosis Anemia  Pt comes in with cc of "feeling sick" She is dyspneic, diophoretic, has cardiac risk factors and an EKG that is concerning - CODE STEMI was called. Cards to bedside, Code  STEMI cancelled, as follow up EKG have improved and patient is chest pain free.  Pt has hx of DVT, and PE is possible. Bedside US shows no right sided collapse.  We will get STAT labs and monitor closely.  ACS is  still high on the ddx.    Derwood Kaplan, MD 07/13/12 1002  11:37 AM We took patient for a CT PE study. I went with the patient to get the study, as she was tachypneic. After the study, patient became somnolent, and unresponsive, with agonal respiration. CPR was initiated. Patient was intubated. CT results show massive PE. Pulm critical are consulted and IR consulted for therapy guided TPA. IR deferring the catheter guided TPA. CCM discussing lytic therapy. Pt intubated, pressors started.   INTUBATION Performed by: Dr. Tomasita Crumble Supervised by: Derwood Kaplan  Required items: required blood products, implants, devices, and special equipment available Patient identity confirmed: provided demographic data and hospital-assigned identification number Time out: Immediately prior to procedure a "time out" was called to verify the correct patient, procedure, equipment, support staff and site/side marked as required.  Indications: Airway protection.  Intubation method: Direct laryngoscopy  Preoxygenation: BVM  Sedatives: None Paralytic: None  Tube Size: 7.5 cuffed  Post-procedure assessment: chest rise and ETCO2 monitor Breath sounds: equal and absent over the epigastrium Tube secured with: ETT holder   Patient tolerated the procedure well with no immediate complications.   Cardiopulmonary Resuscitation (CPR) Procedure Note Directed/Performed by: Derwood Kaplan I personally directed ancillary staff and/or performed CPR in an effort to regain return of spontaneous circulation and to maintain cardiac, neuro and systemic perfusion.    Pt received 4 rounds of EPI. Chest compression. Family informed.   CENTRAL LINE Performed by: Dr. Mora Bellman Supervised by:  Derwood Kaplan Consent: The procedure was performed in an emergent situation. Required items: required blood products, implants, devices, and special equipment available Patient identity confirmed: arm band and  provided demographic data Time out: Immediately prior to procedure a "time out" was called to verify the correct patient, procedure, equipment, support staff and site/side marked as required. Indications: vascular access Anesthesia: local infiltration Local anesthetic: lidocaine 1% with epinephrine Anesthetic total: 3 ml Patient sedated: no Preparation: skin prepped with 2% chlorhexidine Skin prep agent dried: skin prep agent completely dried prior to procedure Sterile barriers: all five maximum sterile barriers used - cap, mask, sterile gown, sterile gloves, and large sterile sheet Hand hygiene: hand hygiene performed prior to central venous catheter insertion  Location details: right groin  Catheter type: triple lumen Catheter size: 8 Fr Pre-procedure: landmarks identified Ultrasound guidance: no Successful placement: yes Post-procedure: line sutured and dressing applied Assessment: blood return through all parts, free fluid flow, placement verified by x-ray and no pneumothorax on x-ray Patient tolerance: Patient tolerated the procedure well with no immediate complications.   Angiocath insertion Performed by: Derwood Kaplan  Consent: Verbal consent obtained. Risks and benefits: risks, benefits and alternatives were discussed Time out: Immediately prior to procedure a "time out" was called to verify the correct patient, procedure, equipment, support staff and site/side marked as required.  Preparation: Patient was prepped and draped in the usual sterile fashion.  Vein Location: 20 gauge antecubital  Ultrasound Guided  Gauge: 20 gauge  Normal blood return and flush without difficulty Patient tolerance: Patient tolerated the procedure well with no immediate complications.    Derwood Kaplan, MD 07/13/12 1141  12:15 PM Pt getting iv lytic therapy. CCM taking over.  CRITICAL CARE Performed by: Derwood Kaplan  Total critical care time: 60 minutes  Critical care  time was exclusive of separately billable procedures and treating other patients.  Critical care was necessary to treat or prevent imminent or life-threatening deterioration.  Critical care was time spent personally by me on the following activities: development of treatment plan with patient and/or surrogate as well as nursing, discussions with consultants, evaluation of patient's response to treatment, examination of patient, obtaining history from patient or surrogate, ordering and performing treatments and interventions, ordering and review of laboratory studies, ordering and review of radiographic studies, pulse oximetry and re-evaluation of patient's condition.    Derwood Kaplan, MD 07/13/12 1216   Date: 07/13/2012  Rate: 140  Rhythm: sinus tachycardia  QRS Axis: left  Intervals: normal  ST/T Wave abnormalities: nonspecific ST/T changes  Conduction Disutrbances:none  Narrative Interpretation:   Old EKG Reviewed: changes noted - ST changes resolved   Date: 07/13/2012  Rate: 144  Rhythm: sinus tachycardia  QRS Axis: left  Intervals: normal  ST/T Wave abnormalities: nonspecific ST/T changes  Conduction Disutrbances:none  Narrative Interpretation:   Old EKG Reviewed: changes noted - ST changes resolved     Derwood Kaplan, MD 07/13/12 1226

## 2012-07-13 NOTE — ED Notes (Signed)
Getting ready to do laundry this morning; felt bad; SOB; diaphoretic; no chest pain; EMS did EKG and saw STEMI. VSS. Upon arrival, no signs of distress, no chest pain. 4 baby aspirins given by EMS upon arrival.

## 2012-07-13 NOTE — Progress Notes (Signed)
Handoff from Dr Delton Coombes -saddle PE s/p tpa Intubated, hypotensive, levophed titrated to 40 mcg, leave dopamine at since tachy to 140, sinus. Vasopressin started although doubtful use in obstructive shock. Fluid bolus given, Bicarb gtt started @ 125/h Wrist puncture sites bleeding - obtain BP from LE & corelate Updated family Prognosis guarded  No CPR Addn cc time x 30 mins  ALVA,RAKESH V.

## 2012-07-13 NOTE — Progress Notes (Signed)
ANTICOAGULATION CONSULT NOTE - Initial Consult  Pharmacy Consult for Heparin Indication: pulmonary embolus (s/p TNK)  No Known Allergies  Patient Measurements: Height: 5' (152.4 cm) (estimated) Weight: 230 lb (104.327 kg) (estimate, bed wt unavailable) IBW/kg (Calculated) : 45.5 Heparin Dosing Weight: 71 kg  Vital Signs: Temp: 97.8 F (36.6 C) (07/11 2000) Temp src: Oral (07/11 2000) BP: 108/45 mmHg (07/11 2230) Pulse Rate: 93 (07/11 2230)  Labs:  Recent Labs  07/13/12 0933 07/13/12 1105 07/13/12 1124  07/13/12 1406 07/13/12 1700 07/13/12 2200  HGB 5.9*  --  12.6  --  10.7*  --  9.4*  HCT 19.3*  --  37.0  --  33.9*  --  28.5*  PLT 55*  --   --   --  128*  --  150  APTT 54*  --   --   < > 146* 108* 49*  LABPROT 23.5*  --   --   < > >90.0* 79.7* 27.8*  INR 2.17*  --   --   < > >10.00* >10.00* 2.71*  CREATININE  --  1.56* 1.40*  --   --   --   --   TROPONINI <0.30  --   --   --   --   --   --   < > = values in this interval not displayed.  Estimated Creatinine Clearance: 40.1 ml/min (by C-G formula based on Cr of 1.4).   Medical History: Past Medical History  Diagnosis Date  . Thyroid disease   . Diabetes mellitus without complication   . DVT (deep venous thrombosis)     Medications:  Prescriptions prior to admission  Medication Sig Dispense Refill  . aspirin 81 MG tablet Take 81 mg by mouth.      . cholecalciferol (VITAMIN D) 1000 UNITS tablet Take 1,000 Units by mouth.      Marland Kitchen glipiZIDE (GLUCOTROL) 5 MG tablet Take 5 mg by mouth 2 (two) times daily before a meal.      . levothyroxine (SYNTHROID, LEVOTHROID) 50 MCG tablet Take 50 mcg by mouth daily before breakfast.      . lisinopril-hydrochlorothiazide (PRINZIDE,ZESTORETIC) 20-25 MG per tablet Take 1 tablet by mouth daily.      . metFORMIN (GLUCOPHAGE) 500 MG tablet Take 500 mg by mouth 2 (two) times daily with a meal.      . metoprolol tartrate (LOPRESSOR) 25 MG tablet Take 25 mg by mouth 2 (two) times  daily.      Marland Kitchen OXYMETAZOLINE HCL, OPHTH, 0.025 % SOLN Apply to eye. Visine eye gtts      . potassium chloride (K-DUR,KLOR-CON) 10 MEQ tablet Take 10 mEq by mouth daily.      . simvastatin (ZOCOR) 20 MG tablet Take 20 mg by mouth at bedtime.      . trolamine salicylate (ASPERCREME) 10 % cream Apply topically.      . vitamin C (ASCORBIC ACID) 500 MG tablet Take 500 mg by mouth.      . Warfarin Sodium (COUMADIN PO) Take by mouth. Dose unknown        Assessment: 72 yo F s/p TNK for PE.  Patient's labs remained elevated for some time following TNK administration.  Now PTT <80 and ok to start heparin.  Noted INR remains elevated (pt was on Coumadin PTA).  CBC remains stable and no bleeding noted.  Goal of Therapy:  Heparin level 0.3-0.5 units/ml (for first 24 hours following TNK administration) Monitor platelets by anticoagulation protocol: Yes  Plan:  Begin heparin at 1000 units/hr.  No bolus. Heparin level 8 hours after gtt started. Heparin level and CBC daily. Will also check INR with AM labs to continue to monitor trend.  [employer], Pharm.D., BCPS Clinical Pharmacist Pager [phone] 07/13/2012 10:54 PM

## 2012-07-13 NOTE — ED Notes (Signed)
Pt reports feeling nauseated and is diaphoretic.

## 2012-07-13 NOTE — ED Notes (Signed)
Cardiologist MD at bedside. Canceled Code STEMI

## 2012-07-13 NOTE — ED Notes (Signed)
MD at bedside. 

## 2012-07-13 NOTE — ED Notes (Signed)
ED MDs performing bedside Echo with dopplar.

## 2012-07-13 NOTE — Progress Notes (Signed)
eLink Physician-Brief Progress Note Patient Name: Hannah [name] DOB: 1940-06-23 MRN: [id number]  Date of Service  07/13/2012   HPI/Events of Note   Agitation  eICU Interventions   Restraints Versed PRN    Intervention Category Major Interventions: Delirium, psychosis, severe agitation - evaluation and management  Seyed Heffley 07/13/2012, 3:26 PM

## 2012-07-13 NOTE — ED Notes (Signed)
Chem 8 results shown to Dr. Marja Kays, 3rd set

## 2012-07-13 NOTE — ED Notes (Signed)
Cannot obtain an urine specimen at this time per RN due to medicines that were given to pt

## 2012-07-13 NOTE — ED Notes (Signed)
Lactic acid results shown to dr. Rhunette Croft, MD

## 2012-07-13 NOTE — Progress Notes (Signed)
Chaplain responded to ED trauma C after receiving a page from the secretary. Pt was in critical condition, intubated and unresponsive. There was a huge family presence in consult room A. Chaplain served as Print production planner between ED staff and family members. Chaplain provided ministry of presence, empathic listening and escort family members as they visit pt in trauma bay. Chaplain also provided refreshment to family. Pt was later admitted to 2110. Family thanked chaplain for making sure doctors came to update them on pt's condition periodically, his emotional support and presence. Kelle Darting [phone]  07/13/12 1430  Clinical Encounter Type  Visited With Patient and family together  Visit Type Initial;Spiritual support;Critical Care;ED;Trauma  Referral From Nurse  Spiritual Encounters  Spiritual Needs Emotional  Stress Factors  Patient Stress Factors Not reviewed  Family Stress Factors Major life changes

## 2012-07-13 NOTE — ED Notes (Signed)
i-stat troponin results called to Dr. Rhunette Croft, chem 8 results given to primary nurse Silva Bandy

## 2012-07-13 NOTE — Progress Notes (Signed)
eLink Physician-Brief Progress Note Patient Name: Hannah [name] DOB: 22-Feb-1940 MRN: [id number]  Date of Service  07/13/2012   HPI/Events of Note   Coagulopathy post TNK Massive PE with hemodynamic instability   eICU Interventions   Per RN remains nonfocal, follows commands, no overt hemorrhage Fibrinogen pending CBC, APTT, INR @ 2200 STAT head CT if neuro changes    Intervention Category Major Interventions: OtherLonia Farber 07/13/2012, 8:32 PM

## 2012-07-13 NOTE — Progress Notes (Addendum)
ANTICOAGULATION CONSULT NOTE - Initial Consult  Pharmacy Consult for Heparin Indication: pulmonary embolus  No Known Allergies  Patient Measurements: Weight = 104 kg  Vital Signs: Temp src: Oral (07/11 0926) BP: 148/84 mmHg (07/11 0926) Pulse Rate: 140 (07/11 0926)  Labs:  Recent Labs  07/13/12 0933 07/13/12 1105 07/13/12 1124  HGB 5.9*  --  12.6  HCT 19.3*  --  37.0  PLT 55*  --   --   APTT 54*  --   --   LABPROT 23.5*  --   --   INR 2.17*  --   --   CREATININE  --  1.56* 1.40*  TROPONINI <0.30  --   --     CrCl is unknown because there is no height on file for the current visit.   Medical History: Past Medical History  Diagnosis Date  . Thyroid disease   . Diabetes mellitus without complication   . DVT (deep venous thrombosis)     Assessment: 72 year old female with a history of DVT, HTN, CAD presented to the ED feeling sick.  She was taken for a CT PE study where she became somnolent and unresponsive with agonal respirations.  CPR was initiated, pt intubated.  CT confirmed PE.  45 mg of TNK given at 12 noon (based on weight of 80 kg -- given to [place] by ER MD)  Goal of Therapy:  Heparin level 0.3-0.7 units/ml Monitor platelets by anticoagulation protocol: Yes   Plan:  1) Check PTT and begin heparin therapy when PTT < 80 seconds. 2) Continue to follow.  Thank you. Okey Regal, PharmD [phone]   07/13/2012,12:08 PM   Addendum:  Pt received TNK at 1230 today and is to start heparin when PTT<80.  INR >10 and PTT 146 at 1400.  Suspect elevated INR is a combination of TNK effect and shock liver from CPR/arrest.  Will reorder labs for 1700 and begin heparin when PTT <80.  [employer], Pharm.D., BCPS Clinical Pharmacist Pager [phone] 07/13/2012 4:59 PM   Addendum: PTT 108.  Will recheck at 2000.  [employer], Pharm.D., BCPS Clinical Pharmacist Pager [phone] 07/13/2012 7:09 PM

## 2012-07-13 NOTE — H&P (Signed)
PULMONARY  / CRITICAL CARE MEDICINE  Name: Hannah [name] MRN: [id number] DOB: 1940/12/06    ADMISSION DATE:  07/13/2012 CONSULTATION DATE:  07/13/12  REFERRING MD :  EDP - Nanavati PRIMARY SERVICE: PCCM  CHIEF COMPLAINT:  Felt bad, SOB, diaphoretic  BRIEF PATIENT DESCRIPTION: A 72 yo with a PMH of DVT, HTN, HL, DM who presented with massive PE on 7/7.  Patient does take coumadin for her DVT  SIGNIFICANT EVENTS / STUDIES:  7/11: Admitted with PE CT of Chest: central and obstructive bilateral pulmonary embolism with right heart strain  LINES / TUBES: 7/11 ETT >> 7/11 R Fem CVC >>  CULTURES: none  ANTIBIOTICS: none  HISTORY OF PRESENT ILLNESS:  [name] is a 72 yo female with a PMH of HTN, HL, DM who [facility] with a chief complaint of feeling sick. Pt states that she was doing some house chores this morning, when she started feeling sick, with diaphoresis and nausea.  She then began having SOB, chest palpitations, and passed out. She had no chest pains.  EMS was called and when they arrived called a CODE STEMI and gave 4 baby ASA.  On arrival to the [facility]. The patient has a hx of DVT, and is currently on coumadin.  Initial troponin .14 but her EKG improved and Code STEMI was cancelled.  The patient's SOB did not improve; however, so she was taken immediately to CT scan of the chest.  CT scan showed central and obstructive bilateral pulmonary embolism with right heart strain. Once arriving back to the ER the patient PEA arrested, but pulses returned after 3 rounds of CPR.  The patient was intubated and central venous access was obtained. PCCM was called for assistance with further management and admittance.    PAST MEDICAL HISTORY :  Past Medical History  Diagnosis Date  . Thyroid disease   . Diabetes mellitus without complication   . DVT (deep venous thrombosis)    No past surgical history on file. Prior to Admission medications   Not on File   No  Known Allergies  FAMILY HISTORY:  No family history on file. SOCIAL HISTORY:  has no tobacco, alcohol, and drug history on file.  REVIEW OF SYSTEMS:  Unable to obtain  SUBJECTIVE: Unable to obtain  VITAL SIGNS: Pulse Rate:  [140] 140 (07/11 0926) Resp:  [27] 27 (07/11 0926) BP: (148)/(84) 148/84 mmHg (07/11 0926) SpO2:  [100 %] 100 % (07/11 0926) HEMODYNAMICS:   VENTILATOR SETTINGS:   INTAKE / OUTPUT: Intake/Output   None     PHYSICAL EXAMINATION: General:  Elderly, ill appearing female  Neuro:  Moves all ext spontaneously, follows simple commands, PERRLA HEENT:  Neck large, supple, no masses Cardiovascular:  Rapid rate, normal rythym, no murmurs or gallops noted Lungs:  Diminished and coarse throughout. Abdomen:  Large, obese, bs active Musculoskeletal:  Moves all ext, atraumatic Skin:  Intact  LABS:  Recent Labs Lab 07/13/12 0933 07/13/12 1016 07/13/12 1105 07/13/12 1124  HGB 5.9*  --   --  12.6  WBC 8.0  --   --   --   PLT 55*  --   --   --   NA  --   --  138 138  K  --   --  4.2 4.3  CL  --   --  100 106  CO2  --   --  16*  --   GLUCOSE  --   --  334* 316*  BUN  --   --  24* 24*  CREATININE  --   --  1.56* 1.40*  CALCIUM  --   --  8.8  --   AST  --   --  176*  --   ALT  --   --  205*  --   ALKPHOS  --   --  52  --   BILITOT  --   --  0.4  --   PROT  --   --  5.0*  --   ALBUMIN  --   --  2.4*  --   APTT 54*  --   --   --   INR 2.17*  --   --   --   LATICACIDVEN  --  2.88*  --   --   TROPONINI <0.30  --   --   --   PROBNP 84.4  --   --   --    No results found for this basename: GLUCAP,  in the last 168 hours  CXR: 7/11 - No active disease. Mild degenerative changes thoracic spine.  ASSESSMENT / PLAN:  PULMONARY A: Bilateral pulmonary embolism - via CT on 7/11 Acute Respiratory failure - r/t above  P:   - TNK now - Full vent support as ordered - Daily SBT - VBG now and in AM - PCXR now and in AM - VAP prevention - See hematologic  section  CARDIOVASCULAR A: Pulmonary Embolism - as above Hypotension - r/t above Lactic Acid - 2.88 on 7/11 P:  - Maintain MAP greater than 65 - STAT Echo - Norepinephrine ordered - Add Vaso - F/u lactic acid - MIVF ordered - Will reattempt aline in 24 hours in still hemodynamically unstable  RENAL A:  AKI - r/t dye load and hypotension P:   - Monitor I&O - Daily BMP - Check Mag - 2 amps bicarb now  GASTROINTESTINAL A:  Transaminitis - r/t malperfusion and shock liver P:   - CMP in am - NPO for now - OGT to LIWS - PPI for GI Px  HEMATOLOGIC A:   Bilateral Pulmonary Embolism DVT P:  - TNK now (given) - Follow up coagulation studies - Daily pt and inr for now - Heparin gtt per pharmacy - SCDs for DVT px   INFECTIOUS A:  No acute issues P:   - Monitor WBC and fever curve - Culture if febrile  ENDOCRINE A:  Hypothyroid DM   P:   - synthroid IV dosed per pharmacy - CBG Q4H  - ICU hyperglycemia protocol   NEUROLOGIC A:  Acute Encephalopathy - r/t sedation and ARF P:   - Supportive care - Continuous sedation - Daily WUA once stable  TODAY'S SUMMARY: A 72 yo female presented to ER with massive bilateral PE. Intubated and CVC in ER, then TNK administered.  Now hypotensive requiring norepinephrine and supplemental bicarb.  Unable to gain arterial access prior to TNK administration.  Will add vasopressin, dobuta and get STAT echo.  I have personally obtained a history, examined the patient, evaluated laboratory and imaging results, formulated the assessment and plan and placed orders. CRITICAL CARE: The patient is critically ill with multiple organ systems failure and requires high complexity decision making for assessment and support, frequent evaluation and titration of therapies, application of advanced monitoring technologies and extensive interpretation of multiple databases. Critical Care Time devoted to patient care services described in this note is 90  minutes.  Levy Pupa, MD, PhD 07/13/2012, 1:14 PM [facility] [phone] or if no answer [phone]

## 2012-07-13 NOTE — ED Notes (Signed)
Unable to obtain BP with monitor. Doppler pulse was 48, Vasopressin, Dopamine started. Doppler pulse was the 88.

## 2012-07-13 NOTE — Progress Notes (Signed)
eLink Physician-Brief Progress Note Patient Name: REGINA GANCI DOB: [birth year] MRN: [id number]  Date of Service  07/13/2012   HPI/Events of Note   TNK @ noon Coags @ 14:06 --> INR >10, APTT > 146   eICU Interventions   Recheck coags now   Intervention Category Intermediate Interventions: Other:  Lonia Farber 07/13/2012, 5:10 PM

## 2012-07-13 NOTE — Progress Notes (Signed)
  Echocardiogram 2D Echocardiogram has been performed.  Robertt Buda FRANCES 07/13/2012, 6:43 PM 

## 2012-07-14 ENCOUNTER — Inpatient Hospital Stay (HOSPITAL_COMMUNITY): Payer: Medicare Other

## 2012-07-14 DIAGNOSIS — I10 Essential (primary) hypertension: Secondary | ICD-10-CM

## 2012-07-14 DIAGNOSIS — N179 Acute kidney failure, unspecified: Secondary | ICD-10-CM

## 2012-07-14 DIAGNOSIS — E872 Acidosis: Secondary | ICD-10-CM

## 2012-07-14 DIAGNOSIS — E785 Hyperlipidemia, unspecified: Secondary | ICD-10-CM

## 2012-07-14 DIAGNOSIS — I2699 Other pulmonary embolism without acute cor pulmonale: Principal | ICD-10-CM

## 2012-07-14 DIAGNOSIS — J9600 Acute respiratory failure, unspecified whether with hypoxia or hypercapnia: Secondary | ICD-10-CM

## 2012-07-14 HISTORY — DX: Essential (primary) hypertension: I10

## 2012-07-14 HISTORY — DX: Hyperlipidemia, unspecified: E78.5

## 2012-07-14 LAB — BASIC METABOLIC PANEL
BUN: 35 mg/dL — ABNORMAL HIGH (ref 6–23)
BUN: 38 mg/dL — ABNORMAL HIGH (ref 6–23)
CO2: 31 mEq/L (ref 19–32)
Calcium: 7.9 mg/dL — ABNORMAL LOW (ref 8.4–10.5)
Creatinine, Ser: 1.9 mg/dL — ABNORMAL HIGH (ref 0.50–1.10)
GFR calc Af Amer: 30 mL/min — ABNORMAL LOW (ref >90)
GFR calc Af Amer: 24 mL/min — ABNORMAL LOW (ref >90)
GFR calc non Af Amer: 21 mL/min — ABNORMAL LOW (ref >90)
Potassium: 4.8 mEq/L (ref 3.5–5.1)
Sodium: 136 mEq/L (ref 135–145)

## 2012-07-14 LAB — GLUCOSE, CAPILLARY
Glucose-Capillary: 311 mg/dL — ABNORMAL HIGH (ref 70–99)
Glucose-Capillary: 341 mg/dL — ABNORMAL HIGH (ref 70–99)
Glucose-Capillary: 394 mg/dL — ABNORMAL HIGH (ref 70–99)
Glucose-Capillary: 412 mg/dL — ABNORMAL HIGH (ref 70–99)
Glucose-Capillary: 115 mg/dL — ABNORMAL HIGH (ref 70–99)
Glucose-Capillary: 129 mg/dL — ABNORMAL HIGH (ref 70–99)
Glucose-Capillary: 137 mg/dL — ABNORMAL HIGH (ref 70–99)
Glucose-Capillary: 139 mg/dL — ABNORMAL HIGH (ref 70–99)
Glucose-Capillary: 123 mg/dL — ABNORMAL HIGH (ref 70–99)
Glucose-Capillary: 128 mg/dL — ABNORMAL HIGH (ref 70–99)
Glucose-Capillary: 152 mg/dL — ABNORMAL HIGH (ref 70–99)
Glucose-Capillary: 155 mg/dL — ABNORMAL HIGH (ref 70–99)
Glucose-Capillary: 107 mg/dL — ABNORMAL HIGH (ref 70–99)
Glucose-Capillary: 140 mg/dL — ABNORMAL HIGH (ref 70–99)

## 2012-07-14 LAB — URINALYSIS, ROUTINE W REFLEX MICROSCOPIC
Glucose, UA: 250 mg/dL — AB
Ketones, ur: NEGATIVE mg/dL
Leukocytes, UA: NEGATIVE
pH: 5 (ref 5.0–8.0)

## 2012-07-14 LAB — CBC
HCT: 24.9 % — ABNORMAL LOW (ref 36.0–46.0)
MCH: 28.7 pg (ref 26.0–34.0)
MCV: 84.1 fL (ref 78.0–100.0)
Platelets: 145 10*3/uL — ABNORMAL LOW (ref 150–400)
RDW: 13.9 % (ref 11.5–15.5)

## 2012-07-14 LAB — MAGNESIUM: Magnesium: 1.5 mg/dL (ref 1.5–2.5)

## 2012-07-14 LAB — POCT I-STAT 3, ART BLOOD GAS (G3+)
Bicarbonate: 25.9 mEq/L — ABNORMAL HIGH (ref 20.0–24.0)
O2 Saturation: 100 %
TCO2: 27 mmol/L (ref 0–100)
pCO2 arterial: 32.3 mmHg — ABNORMAL LOW (ref 35.0–45.0)
pO2, Arterial: 158 mmHg — ABNORMAL HIGH (ref 80.0–100.0)

## 2012-07-14 LAB — LACTIC ACID, PLASMA: Lactic Acid, Venous: 2.4 mmol/L — ABNORMAL HIGH (ref 0.5–2.2)

## 2012-07-14 LAB — URINE MICROSCOPIC-ADD ON

## 2012-07-14 LAB — PROCALCITONIN: Procalcitonin: 18.35 ng/mL

## 2012-07-14 MED ORDER — DEXTROSE 10 % IV SOLN: INTRAVENOUS | Status: DC | PRN

## 2012-07-14 MED ORDER — SODIUM CHLORIDE 0.9 % IV BOLUS (SEPSIS)
500.0000 mL | INTRAVENOUS | Status: AC
  Administered 2012-07-14: 500 mL via INTRAVENOUS

## 2012-07-14 MED ORDER — ONDANSETRON HCL 4 MG/2ML IJ SOLN: 4.0000 mg | Freq: Four times a day (QID) | INTRAMUSCULAR | Status: DC | PRN

## 2012-07-14 MED ORDER — POTASSIUM CHLORIDE 10 MEQ/100ML IV SOLN
10.0000 meq | INTRAVENOUS | Status: AC
  Administered 2012-07-14 (×6): 10 meq via INTRAVENOUS
  Filled 2012-07-14 (×6): qty 100

## 2012-07-14 MED ORDER — HEPARIN (PORCINE) IN NACL 100-0.45 UNIT/ML-% IJ SOLN
650.0000 [IU]/h | INTRAMUSCULAR | Status: DC
  Administered 2012-07-14: 650 [IU]/h via INTRAVENOUS
  Filled 2012-07-14: qty 250

## 2012-07-14 MED ORDER — SODIUM CHLORIDE 0.9 % IV BOLUS (SEPSIS)
500.0000 mL | Freq: Once | INTRAVENOUS | Status: AC
  Administered 2012-07-14: 500 mL via INTRAVENOUS

## 2012-07-14 MED ORDER — FENTANYL CITRATE 0.05 MG/ML IJ SOLN
25.0000 ug | INTRAMUSCULAR | Status: DC | PRN
  Administered 2012-07-15 – 2012-07-16 (×2): 50 ug via INTRAVENOUS
  Administered 2012-07-17: 25 ug via INTRAVENOUS
  Filled 2012-07-14 (×3): qty 2

## 2012-07-14 MED ORDER — FENTANYL BOLUS VIA INFUSION
25.0000 ug | Freq: Four times a day (QID) | INTRAVENOUS | Status: DC | PRN
  Filled 2012-07-14: qty 100

## 2012-07-14 MED ORDER — SODIUM CHLORIDE 0.9 % IV BOLUS (SEPSIS)
2000.0000 mL | Freq: Once | INTRAVENOUS | Status: AC
  Administered 2012-07-14: 2000 mL via INTRAVENOUS

## 2012-07-14 MED ORDER — MAGNESIUM SULFATE 40 MG/ML IJ SOLN
2.0000 g | Freq: Once | INTRAMUSCULAR | Status: AC
  Administered 2012-07-14: 2 g via INTRAVENOUS
  Filled 2012-07-14: qty 50

## 2012-07-14 MED ORDER — INSULIN GLARGINE 100 UNIT/ML ~~LOC~~ SOLN
25.0000 [IU] | SUBCUTANEOUS | Status: DC
  Filled 2012-07-14: qty 0.25

## 2012-07-14 MED ORDER — INSULIN GLARGINE 100 UNIT/ML ~~LOC~~ SOLN
25.0000 [IU] | Freq: Every day | SUBCUTANEOUS | Status: DC
  Filled 2012-07-14: qty 0.25

## 2012-07-14 MED ORDER — INSULIN ASPART 100 UNIT/ML ~~LOC~~ SOLN
1.0000 [IU] | SUBCUTANEOUS | Status: DC
  Administered 2012-07-14: 2 [IU] via SUBCUTANEOUS
  Administered 2012-07-15 (×2): 3 [IU] via SUBCUTANEOUS

## 2012-07-14 MED ORDER — FENTANYL CITRATE 0.05 MG/ML IJ SOLN
25.0000 ug/h | INTRAMUSCULAR | Status: DC
  Administered 2012-07-15: 150 ug/h via INTRAVENOUS
  Filled 2012-07-14: qty 50

## 2012-07-14 MED ORDER — DEXTROSE 5 % IV SOLN
30.0000 ug/min | INTRAVENOUS | Status: DC
  Administered 2012-07-14: 50 ug/min via INTRAVENOUS
  Filled 2012-07-14 (×2): qty 4

## 2012-07-14 MED ORDER — CHLORHEXIDINE GLUCONATE 0.12 % MT SOLN
OROMUCOSAL | Status: AC
  Administered 2012-07-14: 15 mL
  Filled 2012-07-14: qty 15

## 2012-07-14 NOTE — Progress Notes (Signed)
Dr. Kalman Shan, M.D., [facility].C.P Pulmonary and Critical Care Medicine Staff Physician [facility]  Pulmonary and Critical Care Pager: [phone], If no answer or between  15:00h - 7:00h: call [phone]  07/14/2012 6:49 PM

## 2012-07-14 NOTE — Progress Notes (Signed)
Brief note: lactate not clearing , UOP decreasing and increased pressor requirements. Fluid bolus x 2 L . Repeat lactate and bmet at 1900.   Jaquanda Wickersham NP-C

## 2012-07-14 NOTE — Progress Notes (Signed)
PULMONARY  / CRITICAL CARE MEDICINE  Name: [name] MRN: [id number] DOB: 1940/11/11    ADMISSION DATE:  07/13/2012 CONSULTATION DATE:  07/13/12  REFERRING MD :  EDP - Nanavati PRIMARY SERVICE: PCCM  CHIEF COMPLAINT:  Felt bad, SOB, diaphoretic   BRIEF:  Hannah [name] is a 72 yo female with a PMH of HTN, HL, DM who presented to [facility]11 with a chief complaint of feeling sick. Pt states that she was doing some house chores this morning, when she started feeling sick, with diaphoresis and nausea.  She then began having SOB, chest palpitations, and passed out. She had no chest pains.  EMS was called and when they arrived called a CODE STEMI and gave 4 baby ASA.  On arrival to the [facility]. The patient has a hx of DVT, and is currently on coumadin.  Initial troponin .14 but her EKG improved and Code STEMI was cancelled.  The patient's SOB did not improve; however, so she was taken immediately to CT scan of the chest.  CT scan showed central and obstructive bilateral pulmonary embolism with right heart strain. Once arriving back to the ER the patient PEA arrested, but pulses returned after 3 rounds of CPR.  The patient was intubated and central venous access was obtained. PCCM was called for assistance with further management and admittance 07/13/2012    LINES / TUBES: 7/11 ETT >> 7/11 R Fem CVC >>  CULTURES: 7/11 UC >>  ANTIBIOTICS: none   SIGNIFICANT EVENTS / STUDIES:  7/11: Admitted with PE CT of Chest: central and obstructive bilateral pulmonary embolism with right heart strain 7/12 Echo >EF 60%, RV mod dilat, decreased systolic fxn, RA mild dilation , PAP -45     SUBJECTIVE:  Alert on vent  Weaning on pressors  No further bleeding  Vomited this am .  Alert and Oriented x 3   VITAL SIGNS: Temp:  [97.6 F (36.4 C)-98.8 F (37.1 C)] 98.4 F (36.9 C) (07/12 0742) Pulse Rate:  [91-139] 93 (07/12 0849) Resp:  [11-29] 24 (07/12 0849) BP: (54-147)/(13-105) 92/54 mmHg  (07/12 1130) SpO2:  [99 %-100 %] 100 % (07/12 1130) FiO2 (%):  [30 %-100 %] 30 % (07/12 0849) Weight:  [222 lb 14.2 oz (101.1 kg)-230 lb (104.327 kg)] 222 lb 14.2 oz (101.1 kg) (07/12 0500) HEMODYNAMICS:   VENTILATOR SETTINGS: Vent Mode:  [-] PRVC FiO2 (%):  [30 %-100 %] 30 % Set Rate:  [24 bmp] 24 bmp Vt Set:  [370 mL-500 mL] 370 mL PEEP:  [5 cmH20] 5 cmH20 Plateau Pressure:  [15 cmH20-21 cmH20] 15 cmH20 INTAKE / OUTPUT: Intake/Output     07/11 0701 - 07/12 0700 07/12 0701 - 07/13 0700   I.V. (mL/kg) 3927.3 (38.8) 759.8 (7.5)   IV Piggyback 200 400   Total Intake(mL/kg) 4127.3 (40.8) 1159.8 (11.5)   Urine (mL/kg/hr) 175 65 (0.1)   Total Output 175 65   Net +3952.3 +1094.8          PHYSICAL EXAMINATION: General:  Elderly, ill appearing female  Neuro:  Moves all ext spontaneously, follows simple commands, PERRLA HEENT:  Neck large, supple, no masses Cardiovascular:  RRR  no murmurs or gallops noted Lungs:  Diminished and coarse throughout. Abdomen:  Large, obese, bs active Musculoskeletal:  Moves all ext, atraumatic Skin:  Intact  LABS: - see individual A and P   IMAGING x 48h Ct Angio Chest Pe W/cm &/or Wo Cm  07/13/2012   *RADIOLOGY REPORT*  Clinical  Data: Palpitations and syncope.  CT ANGIOGRAPHY CHEST  Technique:  Multidetector CT imaging of the chest using the standard protocol during bolus administration of intravenous contrast. Multiplanar reconstructed images including MIPs were obtained and reviewed to evaluate the vascular anatomy.  Contrast: 80mL OMNIPAQUE IOHEXOL 350 MG/ML SOLN  Comparison: 03/11/2008.  Findings:  THORACIC INLET/BODY WALL:  No acute abnormality.  MEDIASTINUM:  Extensive pulmonary embolism, essentially with saddle embolus. There are obstructive or nearly obstructive clots in multiple lobar pulmonary arteries, with the right upper lobe least severely affected.  Right heart strain with enlargement of the right atrium and right ventricle.  The  ascending aorta measures 3.7 cm.  Few coronary artery atherosclerotic calcifications. No adenopathy.  LUNG WINDOWS:  Mild dependent atelectasis.  There is some scarring in the medial basal segment right lower lobe related to large osteophytes. No pulmonary infarct at this time.  UPPER ABDOMEN:  Mild thickening of the imaged portions of the bilateral adrenal glands.  OSSEOUS:  No acute fracture.  No suspicious lytic or blastic lesions.  Critical Value/emergent results were called by telephone at the time of interpretation on 07/13/12 at approximately 11am to Dr. Mora Bellman, who verbally acknowledged these results.  Reportedly the patient is hypotensive/pulseless.  IMPRESSION:  Central and obstructive bilateral pulmonary embolism with right heart strain.   Original Report Authenticated By: Tiburcio Pea   Dg Chest Port 1 View  07/14/2012   *RADIOLOGY REPORT*  Clinical Data: Respiratory failure.  PORTABLE CHEST - 1 VIEW  Comparison: 07/13/2012  Findings: Endotracheal tube present with the tip approximately 4 cm above the carina.  Lungs show mild bibasilar atelectasis.  No overt edema is identified.  No significant pleural fluid is seen.  Heart size is within normal limits.  IMPRESSION: Bibasilar atelectasis.   Original Report Authenticated By: Irish Lack, M.D.   Dg Chest Portable 1 View  07/13/2012   *RADIOLOGY REPORT*  Clinical Data: Central line and endotracheal tube placement  PORTABLE CHEST - 1 VIEW  Comparison: Portable chest x-ray of 07/13/2012  Findings: The tip of the endotracheal tube is approximately 1.4 cm above the carina.  No central venous line is seen.  No pneumothorax is noted.  The lungs are not optimally aerated.  Heart size is stable.  IMPRESSION:  1.  Tip of endotracheal tube 1.4 cm above the carina. 2.  No central venous line is seen.   Original Report Authenticated By: Dwyane Dee, M.D.   Dg Chest Port 1 View  07/13/2012   *RADIOLOGY REPORT*  Clinical Data: Chest pain  PORTABLE CHEST - 1 VIEW   Comparison: 02/28/2008  Findings: Cardiomediastinal silhouette is stable.  No acute infiltrate or pleural effusion.  No pulmonary edema.  Mild degenerative changes thoracic spine.  IMPRESSION: No active disease.  Mild degenerative changes thoracic spine.   Original Report Authenticated By: Natasha Mead, M.D.     ASSESSMENT / PLAN:  PULMONARY  Recent Labs Lab 07/13/12 1124 07/13/12 1249 07/14/12 0319  PHART  --   --  7.512*  PCO2ART  --   --  32.3*  PO2ART  --   --  158.0*  HCO3  --  13.9* 25.9*  TCO2 17 16 27   O2SAT  --  30.0 100.0    A: Bilateral pulmonary embolism - via CT on 7/11 Acute Respiratory failure - r/t above  07/14/12: Meets SBT criteria due other than pressor   P:   - Will trial SBT 07/14/12 but no extubation - Full vent support as ordered -d/c  HCO3 drip  - Daily SBT, may be able to extubate in am  - AVOID ABg - PCXR in AM - VAP prevention - See hematologic section    CARDIOVASCULAR  Recent Labs Lab 07/13/12 0933  PROBNP 84.4     Recent Labs Lab 07/13/12 0933  TROPONINI <0.30    A: Pulmonary Embolism - as above Hypotension - r/t above Lactic Acid - 9.4 >6.8  7/12 Echo >EF 60%, RV mod dilat, decreased systolic fxn, RA mild dilation , PAP -45   7/12 >4+ L positive I/O x 24 hr. Poor lactiate clearance  P:  - Wean pressor for  MAP greater than 65 -decrease IVF 75cc /h    RENAL  Recent Labs Lab 07/13/12 1105 07/13/12 1124 07/14/12 0500  NA 138 138 140  K 4.2 4.3 2.8*  CL 100 106 96  CO2 16*  --  31  GLUCOSE 334* 316* 296*  BUN 24* 24* 35*  CREATININE 1.56* 1.40* 1.90*  CALCIUM 8.8  --  7.9*  MG  --   --  1.5  A:  AKI - r/t dye load and hypotension Hypokalemia     P:   - Monitor I&O - Daily BMP - Check Mag in am  - decreased HCO3 drip  -K+ replaced   GASTROINTESTINAL  Recent Labs Lab 07/13/12 1105 07/13/12 1230 07/13/12 1406 07/13/12 1700 07/13/12 2200 07/14/12 0500  AST 176*  --   --   --   --   --   ALT 205*  --    --   --   --   --   ALKPHOS 52  --   --   --   --   --   BILITOT 0.4  --   --   --   --   --   PROT 5.0*  --   --   --   --   --   ALBUMIN 2.4*  --   --   --   --   --   INR  --  4.26* >10.00* >10.00* 2.71* 1.59*    A:  Transaminitis - r/t malperfusion and shock liver P:   - CMP in am - NPO for now - Pepcid for GI Px -Zofran As needed   -hold TF for now, may be able to extubate in am   HEMATOLOGIC   Recent Labs Lab 07/13/12 1406 07/13/12 2200 07/14/12 0500  HGB 10.7* 9.4* 8.5*  HCT 33.9* 28.5* 24.9*  WBC 19.6* 18.5* 18.4*  PLT 128* 150 145*    Recent Labs Lab 07/13/12 1230 07/13/12 1406 07/13/12 1700 07/13/12 2200 07/14/12 0500  INR 4.26* >10.00* >10.00* 2.71* 1.59*     A:   Bilateral Pulmonary Embolism s/p TNK 7/11  DVT 07/14/12: Had brief oozing from RT femoral vein site; perssure applied and heparin held for 1h and resolved  P:   - Follow up coagulation studies - Daily pt and inr for now - Heparin gtt per pharmacy - SCDs for DVT px   INFECTIOUS  Recent Labs Lab 07/13/12 0933 07/13/12 1406 07/13/12 2200 07/14/12 0500  WBC 8.0 19.6* 18.5* 18.4*    Recent Labs Lab 07/13/12 1253 07/13/12 1746 07/14/12 0500  LATICACIDVEN 9.4* 9.5* 6.8*    A:  No acute issues. Appears to have poor lactate clearance due to PE/shock P:   - Monitor WBC and fever curve - Culture if febrile - Check PCT - trend lactiate  ENDOCRINE    Recent Labs  Lab 07/14/12 0716 07/14/12 0816 07/14/12 0856 07/14/12 0954 07/14/12 1055  GLUCAP 144* 139* 129* 137* 115*   A:  Hypothyroid  DM   P:   - synthroid IV dosed per pharmacy - CBG Q4H  - ICU hyperglycemia protocol   NEUROLOGIC A:  Acute Encephalopathy - r/t sedation and ARF P:   - Supportive care - Continuous sedation - Daily WUA once stable  TODAY'S SUMMARY: A 71 yo female presented to ER with massive bilateral PE. Intubated and CVC in ER, then TNK administered.    Weaning off pressors, mentation  improved     PARRETT,TAMMY NP  -C  07/14/2012, 11:51 AM [facility]   [phone]   STAFF NOTE: I, Dr Lavinia Sharps have personally reviewed patient's available data, including medical history, events of note, physical examination and test results as part of my evaluation. I have discussed with resident/NP and other care providers such as pharmacist, RN and RRT.  In addition,  I personally evaluated patient and elicited key findings of acute massive PE/ s/p TPA. Now  On IV heparin. Looks good for extubation but has poor lactate clearance and still on pressors. Will do SBT but no extubation. Will recheck lactate. Will check PCT to be sure no sepsis. Family was updated by NP  Rest per NP/medical resident whose note is outlined above and that I agree with  The patient is critically ill with multiple organ systems failure and requires high complexity decision making for assessment and support, frequent evaluation and titration of therapies, application of advanced monitoring technologies and extensive interpretation of multiple databases.   Critical Care Time devoted to patient care services described in this note is  31  Minutes independent of NP time.  Dr. Kalman Shan, M.D., [facility].C.P Pulmonary and Critical Care Medicine Staff Physician [facility] Pager: [phone], If no answer or between  15:00h - 7:00h: call [phone]  07/14/2012 3:45 PM

## 2012-07-14 NOTE — Progress Notes (Signed)
eLink Physician-Brief Progress Note Patient Name: Hannah [name] DOB: [birth year] MRN: [id number]  Date of Service  07/14/2012   HPI/Events of Note  Increasing pressor requirement, low urine output, rising creatinine   eICU Interventions  Gave 1L NS bolus, started phenyl drip.    Intervention Category Major Interventions: Hypotension - evaluation and management  GIDDINGS, OLIVIA K. 07/14/2012, 9:34 PM

## 2012-07-14 NOTE — Progress Notes (Signed)
CRITICAL VALUE ALERT  Critical value received:  Fibrinogen <60  Date of notification:  07-13-12  Time of notification:  2200  Critical value read back:yes  Nurse who received alert:  Dr. Darrick Penna  MD notified (1st page):  2200  Time of first page: 2200  MD notified (2nd page):  Time of second page:  Responding MD: Dr. Darrick Penna  Time MD responded:2200

## 2012-07-14 NOTE — Progress Notes (Signed)
ANTICOAGULATION CONSULT NOTE - Follow Up  Pharmacy Consult for Heparin Indication: pulmonary embolus (s/p TNK)  No Known Allergies  Patient Measurements: Height: 5' (152.4 cm) (estimated) Weight: 222 lb 14.2 oz (101.1 kg) IBW/kg (Calculated) : 45.5 Heparin Dosing Weight: 71 kg  Vital Signs: Temp: 98.2 F (36.8 C) (07/12 0347) Temp src: Oral (07/12 0347) BP: 80/48 mmHg (07/12 0600) Pulse Rate: 110 (07/12 0310)  Labs:  Recent Labs  07/13/12 0933 07/13/12 1105 07/13/12 1124  07/13/12 1406 07/13/12 1700 07/13/12 2200 07/14/12 0500  HGB 5.9*  --  12.6  --  10.7*  --  9.4* 8.5*  HCT 19.3*  --  37.0  --  33.9*  --  28.5* 24.9*  PLT 55*  --   --   --  128*  --  150 145*  APTT 54*  --   --   < > 146* 108* 49*  --   LABPROT 23.5*  --   --   < > >90.0* 79.7* 27.8* 18.5*  INR 2.17*  --   --   < > >10.00* >10.00* 2.71* 1.59*  HEPARINUNFRC  --   --   --   --   --   --   --  0.60  CREATININE  --  1.56* 1.40*  --   --   --   --  1.90*  TROPONINI <0.30  --   --   --   --   --   --   --   < > = values in this interval not displayed.  Estimated Creatinine Clearance: 29 ml/min (by C-G formula based on Cr of 1.9).  Medical History: Past Medical History  Diagnosis Date  . Thyroid disease   . Diabetes mellitus without complication   . DVT (deep venous thrombosis)    Medications:  Prescriptions prior to admission  Medication Sig Dispense Refill  . aspirin 81 MG tablet Take 81 mg by mouth.      . cholecalciferol (VITAMIN D) 1000 UNITS tablet Take 1,000 Units by mouth.      Marland Kitchen glipiZIDE (GLUCOTROL) 5 MG tablet Take 5 mg by mouth 2 (two) times daily before a meal.      . levothyroxine (SYNTHROID, LEVOTHROID) 50 MCG tablet Take 50 mcg by mouth daily before breakfast.      . lisinopril-hydrochlorothiazide (PRINZIDE,ZESTORETIC) 20-25 MG per tablet Take 1 tablet by mouth daily.      . metFORMIN (GLUCOPHAGE) 500 MG tablet Take 500 mg by mouth 2 (two) times daily with a meal.      .  metoprolol tartrate (LOPRESSOR) 25 MG tablet Take 25 mg by mouth 2 (two) times daily.      Marland Kitchen OXYMETAZOLINE HCL, OPHTH, 0.025 % SOLN Apply to eye. [facility] eye gtts      . potassium chloride (K-DUR,KLOR-CON) 10 MEQ tablet Take 10 mEq by mouth daily.      . simvastatin (ZOCOR) 20 MG tablet Take 20 mg by mouth at bedtime.      . trolamine salicylate (ASPERCREME) 10 % cream Apply topically.      . vitamin C (ASCORBIC ACID) 500 MG tablet Take 500 mg by mouth.      . Warfarin Sodium (COUMADIN PO) Take by mouth. Dose unknown       Assessment: 72 yo F s/p TNK for PE.  Patient's labs remained elevated for some time following TNK administration.  Now PTT <80 and ok to start heparin.  Noted INR remains elevated but trending down (  now 1.59) (pt was on Coumadin PTA).  CBC remains stable with some thrombocytopenia/anemia but no bleeding noted.  Heparin level slightly above our goal of 0.3-0.5.     Goal of Therapy:  Heparin level 0.3-0.5 units/ml (for first 24 hours following TNK administration) Monitor platelets by anticoagulation protocol: Yes   Plan:  Will decrease heparin to 850 units/hr.   Heparin level 8 hours after gtt rate changed Heparin level and CBC daily. Will also check INR with AM labs to continue to monitor trend.  Nadara Mustard, PharmD., MS Clinical Pharmacist Pager:  [phone] Thank you for allowing pharmacy to be part of this patients care team. 07/14/2012 7:16 AM

## 2012-07-14 NOTE — Progress Notes (Signed)
INITIAL NUTRITION ASSESSMENT  DOCUMENTATION CODES Per approved criteria  -Morbid Obesity   INTERVENTION: 1. Recommend initiation of enteral nutrition if pt to remain intubated >48 hrs.  2. If EN warranted, recommend initiate Promote @ 20 ml/hr and advance by 10 ml q 4 hr to a goal rate of 25 ml/hr. Also, 30 Pro-stat 6 times daily. At goal rate this would provide 1200 kcal (70% kcal needs), 128 gm protein (100% protein needs) and 503 ml free water daily.   NUTRITION DIAGNOSIS: Inadequate oral intake related to inability to eat as evidenced by NPO with mechanical ventilation .   Goal: Enteral nutrition to provide 60-70% of estimated calorie needs (22-25 kcals/kg ideal body weight) and 100% of estimated protein needs, based on ASPEN guidelines for permissive underfeeding in critically ill obese individuals.   Monitor:  Vent status, TF initiation, weight trends, labs, I/O's  Reason for Assessment: VRDF   72 y.o. female  Admitting Dx: Bilateral PE  ASSESSMENT: Pt presented to ED with massive bilateral PE per notes. Pt s/p PEA arrest in ED with ROSC after 3 rounds of CPR. Pt was then intubated.  May extubate in the morning per notes. If pt remains intubated, recommend initiation of enteral nutrition.   Height: Ht Readings from Last 1 Encounters:  07/13/12 5' (1.524 m)    Weight: Wt Readings from Last 1 Encounters:  07/14/12 222 lb 14.2 oz (101.1 kg)    Ideal Body Weight: 100 lbs   % Ideal Body Weight: 222%  Wt Readings from Last 10 Encounters:  07/14/12 222 lb 14.2 oz (101.1 kg)    Usual Body Weight: unknown at this time  % Usual Body Weight: --  BMI:  Body mass index is 43.53 kg/(m^2). Obesity class 3, extreme   Patient is currently intubated on ventilator support.  MV: 9.6   Temp:Temp (24hrs), Avg:98.2 F (36.8 C), Min:97.6 F (36.4 C), Max:98.8 F (37.1 C)  Propofol: none   Estimated Nutritional Needs: Kcal: 1711 Protein: >/=114 gm  Fluid: >/= 1.5 L    Skin: intact   Diet Order: NPO  EDUCATION NEEDS: -No education needs identified at this time   Intake/Output Summary (Last 24 hours) at 07/14/12 1305 Last data filed at 07/14/12 1300  Gross per 24 hour  Intake 5362.19 ml  Output    340 ml  Net 5022.19 ml    Last BM: PTA    Labs:   Recent Labs Lab 07/13/12 1105 07/13/12 1124 07/14/12 0500  NA 138 138 140  K 4.2 4.3 2.8*  CL 100 106 96  CO2 16*  --  31  BUN 24* 24* 35*  CREATININE 1.56* 1.40* 1.90*  CALCIUM 8.8  --  7.9*  MG  --   --  1.5  GLUCOSE 334* 316* 296*    CBG (last 3)   Recent Labs  07/14/12 0856 07/14/12 0954 07/14/12 1055  GLUCAP 129* 137* 115*    Scheduled Meds: . sodium chloride   Intravenous Once  . famotidine (PEPCID) IV  20 mg Intravenous Q12H  . levothyroxine  25 mcg Intravenous Daily  . ondansetron  8 mg Oral Once  . sodium chloride  1,000 mL Intravenous Once    Continuous Infusions: . sodium chloride    . fentaNYL infusion INTRAVENOUS 150 mcg/hr (07/14/12 1200)  . heparin 850 Units/hr (07/14/12 0718)  . insulin (NOVOLIN-R) infusion 5.7 Units/hr (07/14/12 1300)  . norepinephrine (LEVOPHED) Adult infusion 38 mcg/min (07/14/12 0338)  . vasopressin (PITRESSIN) infusion - *FOR SHOCK*  0.03 Units/min (07/13/12 1320)    Past Medical History  Diagnosis Date  . Thyroid disease   . Diabetes mellitus without complication   . DVT (deep venous thrombosis)     No past surgical history on file.  Clarene Duke RD, LDN Pager [phone] After Hours pager [phone]

## 2012-07-14 NOTE — Progress Notes (Signed)
eLink Physician-Brief Progress Note Patient Name: Hannah [name] DOB: 12/12/1940 MRN: [id number]  Date of Service  07/14/2012   HPI/Events of Note  Hypokalemia and hypomag   eICU Interventions  Potassium and Magnesium replaced   Intervention Category Minor Interventions: Electrolytes abnormality - evaluation and management  Deanglo Hissong 07/14/2012, 6:25 AM

## 2012-07-14 NOTE — Progress Notes (Signed)
ANTICOAGULATION CONSULT NOTE - Follow Up  Pharmacy Consult for Heparin Indication: pulmonary embolus (s/p TNK)  No Known Allergies  Patient Measurements: Height: 5' (152.4 cm) (estimated) Weight: 222 lb 14.2 oz (101.1 kg) IBW/kg (Calculated) : 45.5 Heparin Dosing Weight: 71 kg  Vital Signs: Temp: 99.1 F (37.3 C) (07/12 1500) Temp src: Oral (07/12 1500) BP: 109/93 mmHg (07/12 1533) Pulse Rate: 101 (07/12 1533)  Labs:  Recent Labs  07/13/12 0933 07/13/12 1105 07/13/12 1124  07/13/12 1406 07/13/12 1700 07/13/12 2200 07/14/12 0500 07/14/12 1530  HGB 5.9*  --  12.6  --  10.7*  --  9.4* 8.5*  --   HCT 19.3*  --  37.0  --  33.9*  --  28.5* 24.9*  --   PLT 55*  --   --   --  128*  --  150 145*  --   APTT 54*  --   --   < > 146* 108* 49*  --   --   LABPROT 23.5*  --   --   < > >90.0* 79.7* 27.8* 18.5*  --   INR 2.17*  --   --   < > >10.00* >10.00* 2.71* 1.59*  --   HEPARINUNFRC  --   --   --   --   --   --   --  0.60 0.85*  CREATININE  --  1.56* 1.40*  --   --   --   --  1.90*  --   TROPONINI <0.30  --   --   --   --   --   --   --   --   < > = values in this interval not displayed.  Estimated Creatinine Clearance: 29 ml/min (by C-G formula based on Cr of 1.9).  Medical History: Past Medical History  Diagnosis Date  . Thyroid disease   . Diabetes mellitus without complication   . DVT (deep venous thrombosis)    Medications:  Prescriptions prior to admission  Medication Sig Dispense Refill  . aspirin 81 MG tablet Take 81 mg by mouth.      . cholecalciferol (VITAMIN D) 1000 UNITS tablet Take 1,000 Units by mouth.      Marland Kitchen glipiZIDE (GLUCOTROL) 5 MG tablet Take 5 mg by mouth 2 (two) times daily before a meal.      . levothyroxine (SYNTHROID, LEVOTHROID) 50 MCG tablet Take 50 mcg by mouth daily before breakfast.      . lisinopril-hydrochlorothiazide (PRINZIDE,ZESTORETIC) 20-25 MG per tablet Take 1 tablet by mouth daily.      . metFORMIN (GLUCOPHAGE) 500 MG tablet Take  500 mg by mouth 2 (two) times daily with a meal.      . metoprolol tartrate (LOPRESSOR) 25 MG tablet Take 25 mg by mouth 2 (two) times daily.      Marland Kitchen OXYMETAZOLINE HCL, OPHTH, 0.025 % SOLN Apply to eye. [facility] eye gtts      . potassium chloride (K-DUR,KLOR-CON) 10 MEQ tablet Take 10 mEq by mouth daily.      . simvastatin (ZOCOR) 20 MG tablet Take 20 mg by mouth at bedtime.      . trolamine salicylate (ASPERCREME) 10 % cream Apply topically.      . vitamin C (ASCORBIC ACID) 500 MG tablet Take 500 mg by mouth.      . Warfarin Sodium (COUMADIN PO) Take by mouth. Dose unknown       Assessment: 72 yo F s/p TNK for PE.  Patient's labs remained elevated for some time following TNK administration.  Heparin started when PTT < 80.  Noted INR remains elevated but trending down (now 1.59) (pt was on Coumadin PTA).  CBC remains stable with some thrombocytopenia/anemia but no bleeding noted.    Heparin level slightly above our goal despite earlier rate decrease.  Now that > 24 hrs out from TNK administration, can increase to standard heparin level goal 0.3-0.7 .  Per RN, heparin level drawn from central line and heparin is running peripherally.  RN notes patient dose have a little oozing around IV site.  Goal of Therapy:  Heparin level 0.3-0.7 Monitor platelets by anticoagulation protocol: Yes   Plan:  Will decrease heparin to 650 units/hr.   Heparin level 8 hours after gtt rate changed Heparin level and CBC daily. Will also check INR with AM labs to continue to monitor trend.  Tad Moore, BCPS  Clinical Pharmacist Pager [phone]  07/14/2012 4:14 PM

## 2012-07-14 NOTE — Progress Notes (Signed)
Dr Marchelle Gearing updated on patients vitals and urine output and pressors. Md ordered to bolus 2 liters of normal saline.

## 2012-07-14 NOTE — Progress Notes (Signed)
eLink Physician-Brief Progress Note Patient Name: Hannah [name] DOB: [birth year] MRN: [id number]  Date of Service  07/14/2012   HPI/Events of Note   Lactic acid still pending, phenylephrine added after fluids  eICU Interventions  Repeat 500cc fluid bolus cvp monitoring      Estiven Kohan 07/14/2012, 11:32 PM

## 2012-07-14 NOTE — Progress Notes (Addendum)
Peri care, and Foley catheter inserted, urine culture and UA sent to lab.  Corliss Skains RN

## 2012-07-15 ENCOUNTER — Inpatient Hospital Stay (HOSPITAL_COMMUNITY): Payer: Medicare Other

## 2012-07-15 ENCOUNTER — Other Ambulatory Visit (HOSPITAL_COMMUNITY): Payer: Medicare Other

## 2012-07-15 DIAGNOSIS — E119 Type 2 diabetes mellitus without complications: Secondary | ICD-10-CM

## 2012-07-15 DIAGNOSIS — I2699 Other pulmonary embolism without acute cor pulmonale: Secondary | ICD-10-CM

## 2012-07-15 DIAGNOSIS — R578 Other shock: Secondary | ICD-10-CM

## 2012-07-15 DIAGNOSIS — T81718A Complication of other artery following a procedure, not elsewhere classified, initial encounter: Secondary | ICD-10-CM

## 2012-07-15 HISTORY — DX: Type 2 diabetes mellitus without complications: E11.9

## 2012-07-15 LAB — BLOOD GAS, ARTERIAL
Acid-Base Excess: 2.4 mmol/L — ABNORMAL HIGH (ref 0.0–2.0)
Drawn by: 252031
FIO2: 0.3 %
O2 Saturation: 98.4 %
RATE: 24 resp/min
pO2, Arterial: 103 mmHg — ABNORMAL HIGH (ref 80.0–100.0)

## 2012-07-15 LAB — CBC
Hemoglobin: 4.8 g/dL — CL (ref 12.0–15.0)
MCH: 29.1 pg (ref 26.0–34.0)
MCH: 29.7 pg (ref 26.0–34.0)
MCH: 29.3 pg (ref 26.0–34.0)
MCHC: 31.8 g/dL (ref 30.0–36.0)
Platelets: 95 10*3/uL — ABNORMAL LOW (ref 150–400)
Platelets: 101 10*3/uL — ABNORMAL LOW (ref 150–400)
Platelets: 88 10*3/uL — ABNORMAL LOW (ref 150–400)
RBC: 1.76 MIL/uL — ABNORMAL LOW (ref 3.87–5.11)
RBC: 2.85 MIL/uL — ABNORMAL LOW (ref 3.87–5.11)
RBC: 2.69 MIL/uL — ABNORMAL LOW (ref 3.87–5.11)
RBC: 2.39 MIL/uL — ABNORMAL LOW (ref 3.87–5.11)
RDW: 14.3 % (ref 11.5–15.5)
WBC: 14.1 10*3/uL — ABNORMAL HIGH (ref 4.0–10.5)

## 2012-07-15 LAB — BASIC METABOLIC PANEL
CO2: 25 mEq/L (ref 19–32)
Calcium: 6.9 mg/dL — ABNORMAL LOW (ref 8.4–10.5)
Chloride: 101 mEq/L (ref 96–112)
Potassium: 4 mEq/L (ref 3.5–5.1)
Sodium: 137 mEq/L (ref 135–145)

## 2012-07-15 LAB — COMPREHENSIVE METABOLIC PANEL
ALT: 148 U/L — ABNORMAL HIGH (ref 0–35)
AST: 67 U/L — ABNORMAL HIGH (ref 0–37)
Alkaline Phosphatase: 44 U/L (ref 39–117)
CO2: 28 mEq/L (ref 19–32)
Chloride: 101 mEq/L (ref 96–112)
Creatinine, Ser: 2.25 mg/dL — ABNORMAL HIGH (ref 0.50–1.10)
GFR calc non Af Amer: 21 mL/min — ABNORMAL LOW (ref >90)
Potassium: 4.6 mEq/L (ref 3.5–5.1)
Sodium: 136 mEq/L (ref 135–145)
Total Bilirubin: 0.3 mg/dL (ref 0.3–1.2)

## 2012-07-15 LAB — CBC WITH DIFFERENTIAL/PLATELET
Basophils Absolute: 0 10*3/uL (ref 0.0–0.1)
Basophils Relative: 0 % (ref 0–1)
Eosinophils Absolute: 0 10*3/uL (ref 0.0–0.7)
HCT: 21.5 % — ABNORMAL LOW (ref 36.0–46.0)
MCHC: 35.3 g/dL (ref 30.0–36.0)
Monocytes Absolute: 1.2 10*3/uL — ABNORMAL HIGH (ref 0.1–1.0)
Neutro Abs: 10.8 10*3/uL — ABNORMAL HIGH (ref 1.7–7.7)
Neutrophils Relative %: 77 % (ref 43–77)
RDW: 14.1 % (ref 11.5–15.5)

## 2012-07-15 LAB — HEPARIN LEVEL (UNFRACTIONATED): Heparin Unfractionated: 0.39 IU/mL (ref 0.30–0.70)

## 2012-07-15 LAB — URINE CULTURE: Colony Count: NO GROWTH

## 2012-07-15 LAB — GLUCOSE, CAPILLARY
Glucose-Capillary: 247 mg/dL — ABNORMAL HIGH (ref 70–99)
Glucose-Capillary: 250 mg/dL — ABNORMAL HIGH (ref 70–99)
Glucose-Capillary: 185 mg/dL — ABNORMAL HIGH (ref 70–99)

## 2012-07-15 LAB — PROCALCITONIN: Procalcitonin: 7.93 ng/mL

## 2012-07-15 LAB — TROPONIN I
Troponin I: 1.11 ng/mL (ref <0.30)
Troponin I: 1.08 ng/mL (ref <0.30)

## 2012-07-15 LAB — PHOSPHORUS: Phosphorus: 4.4 mg/dL (ref 2.3–4.6)

## 2012-07-15 LAB — LACTIC ACID, PLASMA: Lactic Acid, Venous: 2.4 mmol/L — ABNORMAL HIGH (ref 0.5–2.2)

## 2012-07-15 LAB — PREPARE RBC (CROSSMATCH)

## 2012-07-15 MED ORDER — PROTAMINE SULFATE 10 MG/ML IV SOLN
10.0000 mg | Freq: Once | INTRAVENOUS | Status: AC
  Administered 2012-07-15: 10 mg via INTRAVENOUS
  Filled 2012-07-15: qty 1

## 2012-07-15 MED ORDER — INSULIN ASPART 100 UNIT/ML ~~LOC~~ SOLN
2.0000 [IU] | SUBCUTANEOUS | Status: DC
  Administered 2012-07-15: 2 [IU] via SUBCUTANEOUS
  Administered 2012-07-15 (×2): 6 [IU] via SUBCUTANEOUS
  Administered 2012-07-15: 4 [IU] via SUBCUTANEOUS
  Administered 2012-07-16: 2 [IU] via SUBCUTANEOUS
  Administered 2012-07-16: 4 [IU] via SUBCUTANEOUS
  Administered 2012-07-16: 2 [IU] via SUBCUTANEOUS
  Administered 2012-07-16: 4 [IU] via SUBCUTANEOUS
  Administered 2012-07-17: 2 [IU] via SUBCUTANEOUS

## 2012-07-15 MED ORDER — DIPHENHYDRAMINE HCL 50 MG/ML IJ SOLN: 25.0000 mg | Freq: Four times a day (QID) | INTRAMUSCULAR | Status: DC | PRN

## 2012-07-15 MED ORDER — CHLORHEXIDINE GLUCONATE 0.12 % MT SOLN
OROMUCOSAL | Status: AC
  Administered 2012-07-15: 15 mL
  Filled 2012-07-15: qty 15

## 2012-07-15 MED ORDER — ACETAMINOPHEN 325 MG PO TABS: 650.0000 mg | ORAL_TABLET | ORAL | Status: DC | PRN

## 2012-07-15 NOTE — Progress Notes (Signed)
eLink Physician-Brief Progress Note Patient Name: DENNISSE SWADER DOB: Aug 29, 1940 MRN: [id number]  Date of Service  07/15/2012   HPI/Events of Note   Hb 8 -->7  eICU Interventions   Transfuse 1 unit PRBCs   Intervention Category Major Interventions: Hemorrhage - evaluation and management  Rhya Shan 07/15/2012, 9:36 PM

## 2012-07-15 NOTE — Consult Note (Signed)
[facility] Consultation  Requesting: Ramaswamy Reason: Thigh hematoma History of Present Illness:  Patient is a 72 y.o. year old female who presents for evaluation of thigh hematoma.  She was admitted several days ago with large PE.  She received anticoagulation and thrombolytics.  She was noted to have substantial decrease in hemoglobin overnight requiring 4 units RBC transfusion.  She had no overt signs of bleeding.  CT scan showed no retroperitoneal hematoma but did show a right thigh hematoma.  She has a right femoral vein catheter.  Other medical problems include DVT, Diabetes both of which are currently stable.  She is currently on a ventilator requiring pressor support.  Her heparin was stopped and reversed with protamine.  Past Medical History  Diagnosis Date  . Thyroid disease   . Diabetes mellitus without complication   . DVT (deep venous thrombosis)     No past surgical history on file.   Social History History  Substance Use Topics  . Smoking status: Not on file  . Smokeless tobacco: Not on file  . Alcohol Use: Not on file    Family History No family history on file.  Allergies  No Known Allergies   Current Facility-Administered Medications  Medication Dose Route Frequency Provider Last Rate Last Dose  . 0.9 %  sodium chloride infusion   Intravenous Once Ankit Nanavati, MD      . 0.9 %  sodium chloride infusion  250 mL Intravenous PRN Simonne Martinet, NP   250 mL at 07/14/12 2100  . 0.9 %  sodium chloride infusion   Intravenous Continuous Lupita Leash, MD 150 mL/hr at 07/15/12 0500    . famotidine (PEPCID) IVPB 20 mg  20 mg Intravenous Q12H Simonne Martinet, NP   20 mg at 07/15/12 1148  . fentaNYL (SUBLIMAZE) 10 mcg/mL in sodium chloride 0.9 % 250 mL infusion  25-300 mcg/hr Intravenous Titrated Carolan Clines, MD 10 mL/hr at 07/15/12 1200 100 mcg/hr at 07/15/12 1200   And  . fentaNYL (SUBLIMAZE) bolus via infusion 25-100 mcg   25-100 mcg Intravenous Q6H PRN Carolan Clines, MD      . fentaNYL (SUBLIMAZE) injection 25-100 mcg  25-100 mcg Intravenous Q2H PRN Kalman Shan, MD      . insulin aspart (novoLOG) injection 2-6 Units  2-6 Units Subcutaneous Q4H Tammy S Parrett, NP   6 Units at 07/15/12 1200  . levothyroxine (SYNTHROID, LEVOTHROID) injection 25 mcg  25 mcg Intravenous Daily Simonne Martinet, NP   25 mcg at 07/15/12 1149  . midazolam (VERSED) injection 1-2 mg  1-2 mg Intravenous Q2H PRN Zigmund Gottron, MD   2 mg at 07/14/12 1813  . norepinephrine (LEVOPHED) 16 mg in dextrose 5 % 250 mL infusion  2-50 mcg/min Intravenous Titrated Ankit Nanavati, MD 14.1 mL/hr at 07/15/12 1100 15 mcg/min at 07/15/12 1100  . phenylephrine (NEO-SYNEPHRINE) 40,000 mcg in dextrose 5 % 250 mL infusion  30-200 mcg/min Intravenous Titrated Carolan Clines, MD   30 mcg/min at 07/15/12 0610  . sodium chloride 0.9 % bolus 1,000 mL  1,000 mL Intravenous Once Simonne Martinet, NP      . vasopressin (PITRESSIN) 50 Units in sodium chloride 0.9 % 250 mL infusion  0.03 Units/min Intravenous Continuous Simonne Martinet, NP 9 mL/hr at 07/14/12 2100 0.03 Units/min at 07/14/12 2100    ROS:   Unable to obtain on ventilator    Physical Examination  Filed Vitals:  07/15/12 1300 07/15/12 1315 07/15/12 1330 07/15/12 1345  BP:      Pulse: 96 95 101 93  Temp:      TempSrc:      Resp:      Height:      Weight:      SpO2: 100% 100% 100% 100%    Body mass index is 46.46 kg/(m^2).  General:  Alert and oriented, no acute distress, appears comfortable on vent writing notes to answer questions HEENT: Normal Neck: No JVD Pulmonary: Coarse bilaterally Cardiac: Regular Rate and Rhythm sinus tachycardia Abdomen: Soft, non-tender, non-distended, no mass, obese Skin: No rash, ecchymosis right upper arm Extremity Pulses:  2+ radial, brachial, femoral, dorsalis pedis, posterior tibial pulses bilaterally Musculoskeletal: Slight limb  discrepancy left leg shorter than right, right leg circumference slightly larger than left, no obvious palpable hematoma no skin compromise no real tenderness, right femoral triple lumen no surrounding hematoma  Neurologic: Upper and lower extremity motor 5/5 and symmetric  DATA: CT chest reviewed, large PE, CT abd pelvis reviewed right mid thigh hematoma not completely visualized   ASSESSMENT: Pulmonary embolus now with bleeding on anticoagulation which has now been stopped.  The right thigh hematoma is probably spontaneous doubt related to catheter.  However, if possible would probably benefit from having catheter moved to a different site potentially IJ with less infection risk and one less insult to the right leg which now has a hematoma.  Other question is whether or not pt needs IVC filter at this point will leave at discretion of critical care.  If she is unable to resume anticoagulation I would definitely suggest this.  Call if questions.  PLAN:  See above  Fabienne Bruns, MD [facility] Office: [phone] Pager: [phone]

## 2012-07-15 NOTE — Procedures (Signed)
Arterial Catheter Insertion Procedure Note ROSANGELA FEHRENBACH [id number] March 30, 1940  Procedure: Insertion of Arterial Catheter  Indications: Blood pressure monitoring  Procedure Details Consent: Unable to obtain consent because of emergent medical necessity. Time Out: Verified patient identification, verified procedure, site/side was marked, verified correct patient position, special equipment/implants available, medications/allergies/relevent history reviewed, required imaging and test results available.  Performed  Maximum sterile technique was used including cap, gloves, gown, hand hygiene, mask and sheet. Skin prep: Chlorhexidine; local anesthetic administered 20 gauge catheter was inserted into left radial artery using the Seldinger technique.  Evaluation Blood flow good; BP tracing good. Complications: No apparent complications.   Adela Ports 07/15/2012

## 2012-07-15 NOTE — Progress Notes (Signed)
CRITICAL VALUE ALERT  Critical value received:  HGb 4.8 and Troponin of 1.57   Date of notification:  07/15/2012  Time of notification:  0610  Critical value read back:yes  Nurse who received alert:  Carlyon Prows RN   MD notified (1st page):  Dr. Trish Mage    Time of first page:  0610  MD notified (2nd page):  Time of second page:  Responding MD:  Dr. Trish Mage  Time MD responded:  [phone]

## 2012-07-15 NOTE — Progress Notes (Signed)
eLink Physician-Brief Progress Note Patient Name: Hannah [name] DOB: [birth year] MRN: [id number]  Date of Service  07/15/2012   HPI/Events of Note   Bleeding, heparin held  eICU Interventions  Protamine 10mg  iv x1      Edessa Jakubowicz 07/15/2012, 7:09 AM

## 2012-07-15 NOTE — Progress Notes (Signed)
ANTICOAGULATION CONSULT NOTE  Pharmacy Consult for Heparin Indication: pulmonary embolus (s/p TNK)  No Known Allergies  Patient Measurements: Height: 5' (152.4 cm) (estimated) Weight: 222 lb 14.2 oz (101.1 kg) IBW/kg (Calculated) : 45.5 Heparin Dosing Weight: 71 kg  Vital Signs: Temp: 98.5 F (36.9 C) (07/13 0029) Temp src: Oral (07/13 0029) BP: 119/54 mmHg (07/12 2311) Pulse Rate: 104 (07/12 2311)  Labs:  Recent Labs  07/13/12 0933  07/13/12 1124  07/13/12 1406 07/13/12 1700 07/13/12 2200 07/14/12 0500 07/14/12 1530 07/14/12 1900 07/15/12 0015  HGB 5.9*  --  12.6  --  10.7*  --  9.4* 8.5*  --   --   --   HCT 19.3*  --  37.0  --  33.9*  --  28.5* 24.9*  --   --   --   PLT 55*  --   --   --  128*  --  150 145*  --   --   --   APTT 54*  --   --   < > 146* 108* 49*  --   --   --   --   LABPROT 23.5*  --   --   < > >90.0* 79.7* 27.8* 18.5*  --   --   --   INR 2.17*  --   --   < > >10.00* >10.00* 2.71* 1.59*  --   --   --   HEPARINUNFRC  --   --   --   --   --   --   --  0.60 0.85*  --  0.39  CREATININE  --   < > 1.40*  --   --   --   --  1.90*  --  2.27*  --   TROPONINI <0.30  --   --   --   --   --   --   --   --   --   --   < > = values in this interval not displayed.  Estimated Creatinine Clearance: 24.3 ml/min (by C-G formula based on Cr of 2.27).  MediAssessment: 72 yo F s/p TNK for PE, for heparin.    Goal of Therapy:  Heparin level 0.3-0.7 Monitor platelets by anticoagulation protocol: Yes   Plan:  Continue Heparin at current rate  Follow-up am labs.  Geannie Risen, PharmD, BCPS   07/15/2012 12:43 AM

## 2012-07-15 NOTE — Progress Notes (Signed)
eLink Physician-Brief Progress Note Patient Name: Hannah [name] DOB: Nov 15, 1940 MRN: [id number]  Date of Service  07/15/2012   HPI/Events of Note   Low UOP, ongoing shock but lactic acid improving   eICU Interventions  Increase IVF rate Insert a-line      Saher Davee 07/15/2012, 4:12 AM

## 2012-07-15 NOTE — Progress Notes (Signed)
PULMONARY  / CRITICAL CARE MEDICINE  Name: Hannah [name] MRN: [id number] DOB: 1940-02-25    ADMISSION DATE:  07/13/2012 CONSULTATION DATE:  07/13/12  REFERRING MD :  EDP - [facility] PRIMARY SERVICE: PCCM  CHIEF COMPLAINT:  Felt bad, SOB, diaphoretic   BRIEF:  Hannah [name] is a 72 yo female with a PMH of HTN, HL, DM who presented to [facility] on [facility]/11 with a chief complaint of feeling sick. Pt states that she was doing some house chores this morning, when she started feeling sick, with diaphoresis and nausea.  She then began having SOB, chest palpitations, and passed out. She had no chest pains.  EMS was called and when they arrived called a CODE STEMI and gave 4 baby ASA.  On arrival to the [facility]. The patient has a hx of DVT, and is currently on coumadin.  Initial troponin .14 but her EKG improved and Code STEMI was cancelled.  The patient's SOB did not improve; however, so she was taken immediately to CT scan of the chest.  CT scan showed central and obstructive bilateral pulmonary embolism with right heart strain. Once arriving back to the ER the patient PEA arrested, but pulses returned after 3 rounds of CPR.  The patient was intubated and central venous access was obtained. PCCM was called for assistance with further management and admittance 07/13/2012    LINES / TUBES: 7/11 ETT >> 7/11 R Fem CVC >> 7/13 L ALine >>  CULTURES: 7/11 UC >>  ANTIBIOTICS: none   SIGNIFICANT EVENTS / STUDIES:  7/11: Admitted with PE CT of Chest: central and obstructive bilateral pulmonary embolism with right heart strain 7/12 Echo >EF 60%, RV mod dilat, decreased systolic fxn, RA mild dilation , PAP -45    SUBJECTIVE:  Increased pressor demands despite fluid challenge overnight  AM labs showed drop in HBG to 4  Hep stopped. Protamine given  2 u PRBC given  No bleeding noted, no rectal, inj site , urine or emesis bld noted.  Mentating well , following commands, writing notes.  CT abd/pelvis  pending  No abd pain, chest pain  Or n/v , HA  UOP decreasing with rising scr   VITAL SIGNS: Temp:  [98.2 F (36.8 C)-99.1 F (37.3 C)] 98.2 F (36.8 C) (07/13 1200) Pulse Rate:  [89-138] 95 (07/13 1500) Resp:  [17-28] 24 (07/13 1146) BP: (63-137)/(33-98) 93/41 mmHg (07/13 1415) SpO2:  [77 %-100 %] 100 % (07/13 1500) Arterial Line BP: (86-160)/(49-98) 108/49 mmHg (07/13 1445) FiO2 (%):  [30 %-40 %] 40 % (07/13 1146) Weight:  [107.9 kg (237 lb 14 oz)] 107.9 kg (237 lb 14 oz) (07/13 0500) HEMODYNAMICS:   VENTILATOR SETTINGS: Vent Mode:  [-] PRVC FiO2 (%):  [30 %-40 %] 40 % Set Rate:  [24 bmp] 24 bmp Vt Set:  [370 mL] 370 mL PEEP:  [5 cmH20] 5 cmH20 Pressure Support:  [5 cmH20] 5 cmH20 Plateau Pressure:  [9 cmH20-18 cmH20] 18 cmH20 INTAKE / OUTPUT: Intake/Output     07/12 0701 - 07/13 0700 07/13 0701 - 07/14 0700   I.V. (mL/kg) 4190.7 (38.8) 1462.1 (13.6)   Blood  275   IV Piggyback 2950    Total Intake(mL/kg) 7140.7 (66.2) 1737.1 (16.1)   Urine (mL/kg/hr) 390 (0.2) 195 (0.2)   Total Output 390 195   Net +6750.7 +1542.1          PHYSICAL EXAMINATION: General:  Elderly, ill appearing female , alert  Neuro:  Moves all ext spontaneously, follows  simple commands, PERRLA, writing notes  HEENT:  Neck large, supple, no masses Cardiovascular:  RRR  no murmurs or gallops noted CVL R. Fem no dsg c/d/i , no oozing noted, L Aline -no bleeding noted.  Lungs:  CTA, sl diminshed bs in bases  Abdomen:  Large, obese, bs active, NT Foley with yellow no frank blood noted.  Musculoskeletal:  Moves all ext, atraumatic. nml grips Skin:  Intact, no bleeding /oozing noted.  EXT: RT thigh swollen and rt arm brusise - new 07/15/12  LABS: - see individual A and P   IMAGING x 48h Ct Abdomen Pelvis Wo Contrast  07/15/2012   *RADIOLOGY REPORT*  Clinical Data: Acute drop in hemoglobin.  Recent TPA menstruation for pulmonary embolism.  Evaluate for retroperitoneal hemorrhage.  CT ABDOMEN AND  PELVIS WITHOUT CONTRAST  Technique:  Multidetector CT imaging of the abdomen and pelvis was performed following the standard protocol without intravenous contrast.  Comparison: 04/24/2008  Findings: Images through the lung bases show a tiny bilateral pleural effusions and bibasilar atelectasis.  Contrast is seen filling the gallbladder, consistent with vicarious excretion related to recent chest CTA. Residual contrast enhancement of renal parenchyma is seen bilaterally, consistent with medical renal disease.  There is no evidence of retroperitoneal hemorrhage or other abdominal or pelvic hematoma. Inferior most images of these cysts study shows asymmetric enlargement of the anterior right thigh muscles, suspicious for intramuscular hematoma, although this is incompletely visualized on this exam.  The Noncontrast images of the liver, spleen, pancreas, and adrenal glands appear normal.  Foley catheter is seen within the bladder which is decompressed.  No soft tissue masses or lymphadenopathy identified.  No evidence of inflammatory process or abnormal fluid collections.  The left hip prosthesis results in beam hardening artifact to the inferior pelvis.  No evidence of dilated bowel loops.  IMPRESSION:  1.  No evidence of retroperitoneal hemorrhage. 2.  Suspect intramuscular hematoma in the anterior right thigh, which is incompletely visualized on this study. Suggest clinical correlation with clinical exam findings. 3.  Tiny bilateral pleural effusions and bibasilar atelectasis. 4.  Residual renal parenchymal contrast enhancement and vicarious excretion of contrast in the gallbladder.  This consistent with renal dysfunction.  No evidence of hydronephrosis.   Original Report Authenticated By: Myles Rosenthal, M.D.   Dg Chest Port 1 View  07/15/2012   *RADIOLOGY REPORT*  Clinical Data: Intubated patient.  PORTABLE CHEST - 1 VIEW  Comparison: 07/14/2012  Findings: Endotracheal tube appears unchanged in position.  Shallow  inspiration with atelectasis in the lung bases.  No developing consolidation.  No pneumothorax.  No significant interval change.  IMPRESSION: Shallow inspiration with bilateral basilar atelectasis. Endotracheal tube is unchanged in position.   Original Report Authenticated By: Burman Nieves, M.D.   Dg Chest Port 1 View  07/14/2012   *RADIOLOGY REPORT*  Clinical Data: Respiratory failure.  PORTABLE CHEST - 1 VIEW  Comparison: 07/13/2012  Findings: Endotracheal tube present with the tip approximately 4 cm above the carina.  Lungs show mild bibasilar atelectasis.  No overt edema is identified.  No significant pleural fluid is seen.  Heart size is within normal limits.  IMPRESSION: Bibasilar atelectasis.   Original Report Authenticated By: Irish Lack, M.D.     ASSESSMENT / PLAN:  PULMONARY  Recent Labs Lab 07/13/12 1124 07/13/12 1249 07/14/12 0319 07/15/12 0307  PHART  --   --  7.512* 7.398  PCO2ART  --   --  32.3* 44.5  PO2ART  --   --  158.0* 103.0*  HCO3  --  13.9* 25.9* 26.8*  TCO2 17 16 27  28.2  O2SAT  --  30.0 100.0 98.4    A: Bilateral pulmonary embolism - via CT on 7/11 Acute Respiratory failure - r/t above  07/15/2012 CXR Shallow inspiration with bilateral basilar atelectasis. Endotracheal tube is unchanged in position  P:    - Full vent support as ordered-no weaning until more stable off pressors   - PCXR in AM - VAP prevention - See hematologic section    CARDIOVASCULAR  Recent Labs Lab 07/13/12 0933 07/15/12 0500  PROBNP 84.4 5711.0*      Recent Labs Lab 07/13/12 0933 07/15/12 0500 07/15/12 1230  TROPONINI <0.30 1.57* 1.11*    A: Pulmonary Embolism - as above Hypotension - pressor dependent secondary to massive PE , Hypovolemic/hemorrhagic shock  Lactic Acid - 9.4 >6.8 >2.6  7/12 Echo >EF 60%, RV mod dilat, decreased systolic fxn, RA mild dilation , PAP -45  Cardiac enzyme bump ? Demand   7/13 > Lactate clearing , however with rising pressor  demands and elevated PCT /troponin bump in setting of severe Hbg drop on Hep .   P:  - Titrate pressors for  MAP greater than 65 - Continue IVF at 150cc/hr  -tr LA/PCT  -check EKG    RENAL  Recent Labs Lab 07/13/12 1105 07/13/12 1124 07/14/12 0500 07/14/12 1900 07/15/12 0500  NA 138 138 140 136 136  K 4.2 4.3 2.8* 4.8 4.6  CL 100 106 96 99 101  CO2 16*  --  31 29 28   GLUCOSE 334* 316* 296* 235* 298*  BUN 24* 24* 35* 38* 41*  CREATININE 1.56* 1.40* 1.90* 2.27* 2.25*  CALCIUM 8.8  --  7.9* 7.4* 6.8*  MG  --   --  1.5  --  1.8  PHOS  --   --   --   --  4.4  A:  AKI - r/t dye load and hypotension  Hypokalemia -improved   07/15/2012 >UOP decreasing with rising scr    P:   - Monitor I&O - Daily BMP - Check Mag in am   -cont IV fluid resusciatation  -avoid nephrotoxins   GASTROINTESTINAL  Recent Labs Lab 07/13/12 1105 07/13/12 1230 07/13/12 1406 07/13/12 1700 07/13/12 2200 07/14/12 0500 07/15/12 0500  AST 176*  --   --   --   --   --  67*  ALT 205*  --   --   --   --   --  148*  ALKPHOS 52  --   --   --   --   --  44  BILITOT 0.4  --   --   --   --   --  0.3  PROT 5.0*  --   --   --   --   --  3.9*  ALBUMIN 2.4*  --   --   --   --   --  1.8*  INR  --  4.26* >10.00* >10.00* 2.71* 1.59*  --     A:  Transaminitis - r/t malperfusion and shock liver 07/15/2012 >LFT tr down   P:   - CMP in am - NPO for now - Pepcid for GI Px -Zofran As needed   -hold TF for now, may be able to extubate in am   HEMATOLOGIC   Recent Labs Lab 07/15/12 0500 07/15/12 0810 07/15/12 1230  HGB 4.8* 7.6* 8.3*  HCT 15.1* 21.5* 23.6*  WBC  15.4* 14.0* 14.0*  PLT 131* 105* 95*    Recent Labs Lab 07/13/12 1230 07/13/12 1406 07/13/12 1700 07/13/12 2200 07/14/12 0500  INR 4.26* >10.00* >10.00* 2.71* 1.59*     A:   Bilateral Pulmonary Embolism s/p TNK 7/11  DVT Anemia s/p 2 U PRBC and protamine x 1 7/13    07/15/2012 >Sign drop in Hbg on Hep , no obvious signs of  bleeding /oozing from IV sites, rectal, oral , urine .  S/p tranx 2 u prbc and protamine x 1 .   P:   - Follow up coagulation studies - Daily pt and inr for now - SCDs for DVT px  -recheck stat cbc x 1200 - CT abd/pelvis >>no retroperitoneal bleed. Right thigh hematoma  -transfuse  1 u prbc now  -Vascular consult-spoke with Dr. Darrick Penna  Staff note: will give FFP x 1 due to INR 1.8 STAFF NOTE: - Needs RT femoral to come out and replaced by IJ. Will do when safe  INFECTIOUS  Recent Labs Lab 07/13/12 2200 07/14/12 0500 07/15/12 0500 07/15/12 0810 07/15/12 1230  WBC 18.5* 18.4* 15.4* 14.0* 14.0*    Recent Labs Lab 07/14/12 0500 07/14/12 1544 07/14/12 1900 07/15/12 0500  LATICACIDVEN 6.8*  --  2.4* 2.6*  PROCALCITON  --  18.35  --  11.02    A:  No obvious infectious source with elevated  WBC  And PCT bump. Lactate clearing . Suspect stress reaction .  Temp max 99.6 x 24h , afebrile this am   P:   - Monitor WBC and fever curve-  - Pan -Culture if febrile - Trend  PCT and lactate   ENDOCRINE    Recent Labs Lab 07/14/12 1938 07/15/12 0001 07/15/12 0413 07/15/12 0720 07/15/12 1129  GLUCAP 163* 220* 247* 273* 250*   A:  Hypothyroid DM    P:   - synthroid IV dosed per pharmacy - CBG Q4H  - ICU hyperglycemia protocol-change to standard scale    NEUROLOGIC A:  Acute Encephalopathy - r/t sedation and ARF>resolved  07/15/2012 a/o x 3 , writing notes, follows commands approp.   P:   - Supportive care - monitor   TODAY'S SUMMARY: A 72 yo female presented to ER with massive bilateral PE. Intubated and CVC in ER, then TNK administered.   Overnight 7/12-13 with increasing pressor demands with drop in HGB, tranx 2 u prbc and protamine x 1 . CT abd pelvis >returned with no retroperitoneal bleed ? Right thigh hematoma . Dr. Darrick Penna /Vascular consult .   Global : Updated family several times by NP     [facility] NP  -C  07/15/2012, 3:27 PM [place] [facility]   [phone]  07/15/2012 3:27 PM   STAFF NOTE: I, Dr Lavinia Sharps have personally reviewed patient's available data, including medical history, events of note, physical examination and test results as part of my evaluation. I have discussed with resident/NP and other care providers such as pharmacist, RN and RRT.  In addition,  I personally evaluated patient and elicited key findings of acute respiratory failure with clear intact mental status.. Current major issues hemorrhagic shock in the setting of anti-coagulation. Site of bleed is right thigh hematoma and also right arm bruising. Heparin has been stopped and protamine given. She has responded appropriately to 3 units of blood. Her vasopressor needs improved but she still on it. Appreciate vascular service consultation. I will give FFP times one unit. In addition monitor thigh circumference and  CBC every 4 hours. We'll also check a lactate clearance. As soon as possible we'll remove the right femoral line and place an internal jugular central line. Arterial line is to come off as soon as she is off pressors.  Rest per NP/medical resident whose note is outlined above and that I agree with  The patient is critically ill with multiple organ systems failure and requires high complexity decision making for assessment and support, frequent evaluation and titration of therapies, application of advanced monitoring technologies and extensive interpretation of multiple databases.   Critical Care Time devoted to patient care services described in this note is  31  Minutes indepdnent of NP time.  Dr. Kalman Shan, M.D., [facility].C.P Pulmonary and Critical Care Medicine Staff Physician [facility] Pager: [phone], If no answer or between  15:00h - 7:00h: call [phone]  07/15/2012 3:27 PM

## 2012-07-15 NOTE — Progress Notes (Signed)
eLink Physician-Brief Progress Note Patient Name: Hannah [name] DOB: 10/09/1940 MRN: [id number]  Date of Service  07/15/2012   HPI/Events of Note   Notified by RN that Hgb was 4 in setting of shock; no clear sign of bleeding; no OG or rectal bleeding  eICU Interventions  STAT 2 U PRBC Repeat CBC post transfusion CT AB after transfusion to look for retroperitoneal bleed      Amilyah Nack 07/15/2012, 6:16 AM

## 2012-07-16 ENCOUNTER — Encounter (HOSPITAL_COMMUNITY)
Admission: EM | Disposition: A | Discharge status: Home Health | Payer: Self-pay | Source: Home / Self Care | Attending: Emergency Medicine

## 2012-07-16 ENCOUNTER — Inpatient Hospital Stay (HOSPITAL_COMMUNITY): Payer: Medicare Other

## 2012-07-16 DIAGNOSIS — R570 Cardiogenic shock: Secondary | ICD-10-CM

## 2012-07-16 HISTORY — PX: INSERTION OF VENA CAVA FILTER: SHX5513

## 2012-07-16 LAB — TYPE AND SCREEN
Unit division: 0
Unit division: 0
Unit division: 0

## 2012-07-16 LAB — CBC
HCT: 21.3 % — ABNORMAL LOW (ref 36.0–46.0)
HCT: 21.9 % — ABNORMAL LOW (ref 36.0–46.0)
HCT: 22 % — ABNORMAL LOW (ref 36.0–46.0)
Hemoglobin: 7.3 g/dL — ABNORMAL LOW (ref 12.0–15.0)
Hemoglobin: 7.5 g/dL — ABNORMAL LOW (ref 12.0–15.0)
MCH: 28.9 pg (ref 26.0–34.0)
MCH: 29.5 pg (ref 26.0–34.0)
MCHC: 34.1 g/dL (ref 30.0–36.0)
MCV: 84.9 fL (ref 78.0–100.0)
Platelets: 83 10*3/uL — ABNORMAL LOW (ref 150–400)
Platelets: 89 10*3/uL — ABNORMAL LOW (ref 150–400)
RBC: 2.53 MIL/uL — ABNORMAL LOW (ref 3.87–5.11)
RBC: 2.58 MIL/uL — ABNORMAL LOW (ref 3.87–5.11)
RBC: 2.54 MIL/uL — ABNORMAL LOW (ref 3.87–5.11)
RDW: 14.8 % (ref 11.5–15.5)
WBC: 10.3 10*3/uL (ref 4.0–10.5)
WBC: 11.2 10*3/uL — ABNORMAL HIGH (ref 4.0–10.5)

## 2012-07-16 LAB — BLOOD GAS, ARTERIAL
Drawn by: 362771
MECHVT: 370 mL
PEEP: 5 cmH2O
Patient temperature: 98.6
pH, Arterial: 7.443 (ref 7.350–7.450)

## 2012-07-16 LAB — COMPREHENSIVE METABOLIC PANEL
ALT: 87 U/L — ABNORMAL HIGH (ref 0–35)
AST: 31 U/L (ref 0–37)
Alkaline Phosphatase: 42 U/L (ref 39–117)
CO2: 28 mEq/L (ref 19–32)
Calcium: 6.9 mg/dL — ABNORMAL LOW (ref 8.4–10.5)
Potassium: 3.6 mEq/L (ref 3.5–5.1)
Sodium: 140 mEq/L (ref 135–145)
Total Protein: 4.3 g/dL — ABNORMAL LOW (ref 6.0–8.3)

## 2012-07-16 LAB — HEPARIN LEVEL (UNFRACTIONATED): Heparin Unfractionated: <0.1 IU/mL — ABNORMAL LOW (ref 0.30–0.70)

## 2012-07-16 LAB — GLUCOSE, CAPILLARY
Glucose-Capillary: 111 mg/dL — ABNORMAL HIGH (ref 70–99)
Glucose-Capillary: 156 mg/dL — ABNORMAL HIGH (ref 70–99)
Glucose-Capillary: 54 mg/dL — ABNORMAL LOW (ref 70–99)

## 2012-07-16 LAB — POCT I-STAT 3, ART BLOOD GAS (G3+)
Acid-Base Excess: 3 mmol/L — ABNORMAL HIGH (ref 0.0–2.0)
Bicarbonate: 27.6 mEq/L — ABNORMAL HIGH (ref 20.0–24.0)
Patient temperature: 99
TCO2: 29 mmol/L (ref 0–100)

## 2012-07-16 LAB — TROPONIN I: Troponin I: 0.66 ng/mL (ref <0.30)

## 2012-07-16 LAB — PREPARE FRESH FROZEN PLASMA: Unit division: 0

## 2012-07-16 LAB — PROTIME-INR: Prothrombin Time: 14 seconds (ref 11.6–15.2)

## 2012-07-16 SURGERY — INSERTION OF VENA CAVA FILTER
Anesthesia: LOCAL

## 2012-07-16 MED ORDER — ONDANSETRON HCL 4 MG/2ML IJ SOLN
4.0000 mg | Freq: Three times a day (TID) | INTRAMUSCULAR | Status: DC | PRN
  Administered 2012-07-16: 4 mg via INTRAVENOUS
  Filled 2012-07-16: qty 2

## 2012-07-16 MED ORDER — DEXTROSE 50 % IV SOLN
INTRAVENOUS | Status: AC
  Administered 2012-07-16: 25 mL
  Filled 2012-07-16: qty 50

## 2012-07-16 MED ORDER — CHLORHEXIDINE GLUCONATE 0.12 % MT SOLN
15.0000 mL | Freq: Two times a day (BID) | OROMUCOSAL | Status: DC
  Administered 2012-07-16 – 2012-07-17 (×2): 15 mL via OROMUCOSAL
  Filled 2012-07-16: qty 15

## 2012-07-16 MED ORDER — BIOTENE DRY MOUTH MT LIQD
15.0000 mL | Freq: Four times a day (QID) | OROMUCOSAL | Status: DC
  Administered 2012-07-16 (×3): 15 mL via OROMUCOSAL

## 2012-07-16 MED ORDER — BIOTENE DRY MOUTH MT LIQD
15.0000 mL | Freq: Two times a day (BID) | OROMUCOSAL | Status: DC
  Administered 2012-07-17: 15 mL via OROMUCOSAL

## 2012-07-16 MED ORDER — CHLORHEXIDINE GLUCONATE 0.12 % MT SOLN: 15.0000 mL | Freq: Two times a day (BID) | OROMUCOSAL | Status: DC

## 2012-07-16 MED ORDER — MIDAZOLAM HCL 2 MG/2ML IJ SOLN
INTRAMUSCULAR | Status: AC
  Filled 2012-07-16: qty 2

## 2012-07-16 MED ORDER — LIDOCAINE HCL (PF) 1 % IJ SOLN
INTRAMUSCULAR | Status: AC
  Filled 2012-07-16: qty 30

## 2012-07-16 MED ORDER — DEXTROSE 50 % IV SOLN: 25.0000 mL | Freq: Once | INTRAVENOUS | Status: AC | PRN

## 2012-07-16 MED ORDER — CHLORHEXIDINE GLUCONATE 0.12 % MT SOLN
15.0000 mL | Freq: Two times a day (BID) | OROMUCOSAL | Status: DC
  Administered 2012-07-16: 15 mL via OROMUCOSAL

## 2012-07-16 MED ORDER — CHLORHEXIDINE GLUCONATE 0.12 % MT SOLN
OROMUCOSAL | Status: AC
  Filled 2012-07-16: qty 15

## 2012-07-16 NOTE — Progress Notes (Addendum)
Asked by critical care service to place IVC filter Pt now off pressors and near extubation Will place filter today   Attempted to call daughter.  Left message   Fabienne Bruns, MD [facility] [place] Office: [phone] Pager: [phone]

## 2012-07-16 NOTE — Op Note (Signed)
Procedure: Venacavagram, Ultrasound bilat groin, Insert Cook Celect retrievable filter  Preop: Pulmonary embolus Post op: same  Anesthesia: local with sedation  Indications: pt with recent PE anticoagulation had to be stopped for bleeding  Details: After obtaining informed consent, pt was taken to the PV lab.  Pt was placed in supine position.  Each groin was examined with ultrasound.  The femoral vein bilaterally had no obvious thrombus within it.  The right femoral vein was not completely compressible so the left femoral vein was selected for filter placement.  Local anesthesia was infiltrated over the left femoral vein.  An introducer needle was used to cannulate the vein using ultrasound guidance.  The guidewire was advanced up to the right atrium.  The tract was dilated with a 10 Fr dilator.  The delivery sheath and catheter were placed over the guidewire to the level of L2 vertebral body.  A venocavagram was performed through the sheath which showed no clot in the cava and identified the left renal vein at the level of L2 vertebral body.  The [facility] retrievable filter was positioned at the level of L3 and deployed.  The sheath was removed and hemostasis obtained with direct pressure.  There were no complications.  The patient was returned to the ICU in stable condition.  Fabienne Bruns, MD [facility] [place] Office: [phone] Pager: [phone]

## 2012-07-16 NOTE — Care Management Note (Signed)
    Page 1 of 2   07/23/2012     2:48:15 PM   CARE MANAGEMENT NOTE 07/23/2012  Patient:  Hannah [name], Hannah [name]   Account Number:  [id number]  Date Initiated:  07/13/2012  Documentation initiated by:  Avie Arenas  Subjective/Objective Assessment:   Admitted with PE on vent.     Action/Plan:   PT/OT evals ordered 07/18/12   Anticipated DC Date:  07/25/2012   Anticipated DC Plan:  HOME W HOME HEALTH SERVICES      DC Planning Services  CM consult      [facility] Choice  HOME HEALTH   Choice offered to / List presented to:  C-1 Patient           HH agency  [facility]   Status of service:  In process, will continue to follow Medicare Important Message given?   (If response is "NO", the following Medicare IM given date fields will be blank) Date Medicare IM given:   Date Additional Medicare IM given:    Discharge Disposition:    Per UR Regulation:  Reviewed for med. necessity/level of care/duration of stay  If discussed at Long Length of Stay Meetings, dates discussed:   07/19/2012    Comments:  Contact:  Marrian Salvage Daughter     [phone]                 Juliette Mangle - daughter - [phone]                 Romeo Apple - son - [phone]  07/23/12- 1400- Donn Pierini RN, BSN [phone] F/U done with pt regarding HH choice for agency- per pt she and her daughters would like to use [facility] for [facility] services- PT/OT recommending HH-PT/OT- MD please order [facility] if agree- pt also needs a 3n1 for home- will f/u for 02 needs as pt progresses.  07/20/12- 1100- Donn Pierini RN, BSN [phone] Spoke with pt at bedside regarding PT/OT recommendations for HH-cont. therapy- pt agreeable to this and states that she will be going to stay with a daughter who lives here in town. List of [facility] agencies given to pt to review for choice of agency she states she would like to discuss with daughter. Asked pt about RW for home- pt states that she has a friend that is going to bring her one  and that she will not need an order for a new one at this time. Will f/u with pt regarding choice for [facility] agency- MD please order HH-PT/OT prior to discharge.  07-16-12 11:30am Avie Arenas, RNBSN [phone] Daughters at bedside.  patient remains intubated but awake and alert - nods head appropriately.  Prior to hospitalization - lives alone and independent - driving self. no equipment.  All in agreement will need rehab prior to discharge home.  As patient progress will follow for level of rehab needed.

## 2012-07-16 NOTE — Progress Notes (Addendum)
PULMONARY  / CRITICAL CARE MEDICINE  Name: Hannah [name] MRN: [id number] DOB: 08/12/1940    ADMISSION DATE:  07/13/2012 CONSULTATION DATE:  07/13/2012  REFERRING MD :  EDP - Nanavati PRIMARY SERVICE: PCCM  CHIEF COMPLAINT:  SOB, diaphoresis  BRIEF PATIENT DESCRIPTION: Hannah [name] is a 72 yo female with a PMH of HTN, HL, DM who presented to [facility] on [facility]11 with a chief complaint of feeling sick. Pt states that she was doing some house chores this morning, when she started feeling sick, with diaphoresis and nausea. She then began having SOB, chest palpitations, and passed out. She had no chest pains. EMS was called and when they arrived called a CODE STEMI and gave 4 baby ASA. On arrival to the [facility]. The patient has a hx of DVT, and is currently on coumadin. Initial troponin .14 but her EKG improved and Code STEMI was cancelled. The patient's SOB did not improve; however, so she was taken immediately to CT scan of the chest. CT scan showed central and obstructive bilateral pulmonary embolism with right heart strain. Once arriving back to the ER the patient PEA arrested, but pulses returned after 3 rounds of CPR. The patient was intubated and central venous access was obtained. PCCM was called for assistance with further management and admittance 07/13/2012  SIGNIFICANT EVENTS / STUDIES:  7/11 >>> Admitted with PE, PEA arrest, given TNK 7/11 >>> CT of Chest: central and obstructive bilateral pulmonary embolism with right heart strain  7/12 Echo >>> EF 60%, RV mod dilat, decreased systolic fxn, RA mild dilation , PAP -45  7/13 >>> Sig drop in Hbg on Hep , no obvious signs of bleeding /oozing from IV sites, rectal, oral, urine. Given 4u PRBC, protamine, and 1u FFP 7/13 CT Abd Pelvis >>> Intramuscular R thigh hematoma present  LINES / TUBES: 7/11 ETT >>  7/11 R Fem CVC >>  7/13 L ALine >>  CULTURES: 7/11 UC >> no growth  ANTIBIOTICS: None  SUBJECTIVE: pt reports breathing is comfortable  with weaning  VITAL SIGNS: Temp:  [98.2 F (36.8 C)-99.7 F (37.6 C)] 98.4 F (36.9 C) (07/14 0438) Pulse Rate:  [89-112] 110 (07/14 0700) Resp:  [18-30] 24 (07/14 0700) BP: (90-131)/(33-57) 131/57 mmHg (07/14 0310) SpO2:  [87 %-100 %] 100 % (07/14 0500) Arterial Line BP: (96-159)/(43-80) 140/63 mmHg (07/14 0700) FiO2 (%):  [30 %-40 %] 40 % (07/14 0700) Weight:  [241 lb 10 oz (109.6 kg)] 241 lb 10 oz (109.6 kg) (07/14 0600) HEMODYNAMICS:   VENTILATOR SETTINGS: Vent Mode:  [-] PRVC FiO2 (%):  [30 %-40 %] 40 % Set Rate:  [24 bmp] 24 bmp Vt Set:  [370 mL] 370 mL PEEP:  [5 cmH20] 5 cmH20 Plateau Pressure:  [15 cmH20-20 cmH20] 20 cmH20 INTAKE / OUTPUT: Intake/Output     07/13 0701 - 07/14 0700 07/14 0701 - 07/15 0700   I.V. (mL/kg) 2738.7 (25)    Blood 962.5    IV Piggyback 50    Total Intake(mL/kg) 3751.2 (34.2)    Urine (mL/kg/hr) 955 (0.4)    Total Output 955     Net +2796.2            PHYSICAL EXAMINATION: General:  NAD Neuro:  Nods to questions HEENT:  ETT in place Cardiovascular:  Tachycardic slightly, regular rhythm Lungs:  CTAB, bilat breath sounds present Abdomen:  Soft, nontender to palpation Musculoskeletal:  Extremities atraumatic Skin:  No rashes noted  LABS:  Recent Labs Lab 07/13/12 0933  07/13/12 1105 07/13/12 1124  07/13/12 1700  07/13/12 2200  07/14/12 0500  07/14/12 1900 07/15/12 0307 07/15/12 0500  07/15/12 1230 07/15/12 1600 07/15/12 1942 07/16/12 0330 07/16/12 0336 07/16/12 0340 07/16/12 0345  HGB 5.9*  --   --  12.6  < >  --   --  9.4*  --  8.5*  --   --   --  4.8*  < > 8.3* 8.0* 7.0*  --   --   --  7.3*  WBC 8.0  --   --   --   < >  --   --  18.5*  --  18.4*  --   --   --  15.4*  < > 14.0* 14.1* 12.0*  --   --   --  10.3  PLT 55*  --   --   --   < >  --   --  150  --  145*  --   --   --  131*  < > 95* 101* 88*  --   --   --  83*  NA  --   < > 138 138  --   --   --   --   --  140  --  136  --  136  --   --  137  --   --   --   --   140  K  --   < > 4.2 4.3  --   --   --   --   --  2.8*  --  4.8  --  4.6  --   --  4.0  --   --   --   --  3.6  CL  --   < > 100 106  --   --   --   --   --  96  --  99  --  101  --   --  101  --   --   --   --  105  CO2  --   < > 16*  --   --   --   --   --   --  31  --  29  --  28  --   --  25  --   --   --   --  28  GLUCOSE  --   < > 334* 316*  --   --   --   --   --  296*  --  235*  --  298*  --   --  209*  --   --   --   --  149*  BUN  --   < > 24* 24*  --   --   --   --   --  35*  --  38*  --  41*  --   --  44*  --   --   --   --  42*  CREATININE  --   < > 1.56* 1.40*  --   --   --   --   --  1.90*  --  2.27*  --  2.25*  --   --  2.17*  --   --   --   --  1.98*  CALCIUM  --   < > 8.8  --   --   --   --   --   --  7.9*  --  7.4*  --  6.8*  --   --  6.9*  --   --   --   --  6.9*  MG  --   --   --   --   --   --   --   --   --  1.5  --   --   --  1.8  --   --   --   --   --   --   --   --   PHOS  --   --   --   --   --   --   --   --   --   --   --   --   --  4.4  --   --   --   --   --   --   --   --   AST  --   --  176*  --   --   --   --   --   --   --   --   --   --  67*  --   --   --   --   --   --   --  31  ALT  --   --  205*  --   --   --   --   --   --   --   --   --   --  148*  --   --   --   --   --   --   --  87*  ALKPHOS  --   --  52  --   --   --   --   --   --   --   --   --   --  44  --   --   --   --   --   --   --  42  BILITOT  --   --  0.4  --   --   --   --   --   --   --   --   --   --  0.3  --   --   --   --   --   --   --  0.5  PROT  --   --  5.0*  --   --   --   --   --   --   --   --   --   --  3.9*  --   --   --   --   --   --   --  4.3*  ALBUMIN  --   --  2.4*  --   --   --   --   --   --   --   --   --   --  1.8*  --   --   --   --   --   --   --  2.0*  APTT 54*  --   --   --   < > 108*  --  49*  --   --   --   --   --   --   --   --   --   --   --   --   --  23*  INR 2.17*  --   --   --   < > >  10.00*  --  2.71*  --  1.59*  --   --   --   --   --   --   --   --   --    --   --  1.10  LATICACIDVEN  --   < >  --   --   < >  --   < >  --   --  6.8*  --  2.4*  --  2.6*  --   --  2.4*  --   --   --   --  1.0  TROPONINI <0.30  --   --   --   --   --   --   --   --   --   --   --   --  1.57*  --  1.11* 1.08*  --   --   --   --  0.83*  PROCALCITON  --   --   --   --   --   --   --   --   --   --   < >  --   --  11.02  --   --  7.93  --   --  5.71  --   --   PROBNP 84.4  --   --   --   --   --   --   --   --   --   --   --   --  5711.0*  --   --   --   --   --   --   --  3809.0*  PHART  --   --   --   --   --   --   --   --   < >  --   --   --  7.398  --   --   --   --   --  7.443  --  7.441  --   PCO2ART  --   --   --   --   --   --   --   --   < >  --   --   --  44.5  --   --   --   --   --  40.7  --  40.5  --   PO2ART  --   --   --   --   --   --   --   --   < >  --   --   --  103.0*  --   --   --   --   --  83.9  --  83.0  --   < > = values in this interval not displayed.  Recent Labs Lab 07/15/12 1526 07/15/12 1945 07/16/12 07/16/12 0032 07/16/12 0349  GLUCAP 185* 133* 54* 111* 141*    CXR: lung fields clear, ET tip 1.9 cm from carina  ASSESSMENT / PLAN:  PULMONARY A: Bilateral pulmonary embolism - via CT on 7/11  Acute Respiratory failure - r/t above  P:  - Full vent support as ordered - try wean today, sbt cpap 5 ps 5, goal 1 hr, assess rsbi Consider T V reduction if back to vent rest - PCXR in AM  - VAP prevention  - See hematologic section  CARDIOVASCULAR A: Pulmonary Embolism - as above  Hypotension  secondary to massive PE , Hypovolemic/hemorrhagic shock, now off  pressors Lactic Acid - 9.4 >6.8 >2.6>1.0 7/12 Echo >EF 60%, RV mod dilat, decreased systolic fxn, RA mild dilation , PAP -45  Troponin elevated - from PE P:  - continue off pressors - Continue IVF at 150cc/hr , reduce as to avoid bowing of septum -tele -anticoagulation held for bleeding -filter needed as would avoid days without anticoagulation with massive PE that caused  arrest -cbc  RENAL A: AKI - likely r/t dye load and hypotension  Hypokalemia -improved  P:  - Monitor I&O  - Daily BMP  - Check Mag in am  - kvo, allow pos balance - avoid nephrotoxins   GASTROINTESTINAL A: Transaminitis - r/t malperfusion and shock liver, LFT's downtrending P:  - CMP in am  - Pepcid for GI Px  - Zofran prn - NPO, start if not extubated  HEMATOLOGIC A:  Bilateral Pulmonary Embolism s/p TNK 7/11  DVT  Anemia s/p 4 U PRBC, 1u FFP and protamine x 1 7/13  Thrombocytopenia R thigh hematoma P:  - Daily PT and INR - SCDs for DVT px - although question this due to hematoma? - vascular on board, recommend consider IVC filter if unable to resume anticoagulation- agree - dc fem line, no pressors now, use PIV - Transfuse for Hgb <7, cbc q12h -need dopplers  INFECTIOUS A: No obvious infectious source with elevated WBC  And PCT bump. Lactate clearing . Suspect stress reaction .  P:  - Monitor WBC and fever curve - Pan -Culture if febrile  - Trend PCT and lactate   ENDOCRINE A: Hypothyroid  DM  P:  - synthroid IV dosed per pharmacy  - CBG Q4H + SSI  NEUROLOGIC A: Acute Encephalopathy - r/t sedation and ARF>resolved  P:  - Supportive care  -upright position  TODAY'S SUMMARY: wean from vent, recheck CBC and likely transfuse, needs fliter  Levert Feinstein, MD Family Medicine PGY-2   I have personally obtained a history, examined the patient, evaluated laboratory and imaging results, formulated the assessment and plan and placed orders. CRITICAL CARE: The patient is critically ill with multiple organ systems failure and requires high complexity decision making for assessment and support, frequent evaluation and titration of therapies, application of advanced monitoring technologies and extensive interpretation of multiple databases. Critical Care Time devoted to patient care services described in this note is 35 minutes.   Mcarthur Rossetti. Tyson Alias, MD,  FACP Pgr: [phone] [place] [facility]  Pulmonary and Critical Care Medicine [facility] Pager: [phone]  07/16/2012, 8:07 AM

## 2012-07-16 NOTE — Procedures (Signed)
Extubation Procedure Note  Patient Details:   Name: STACEY SAGO DOB: 12-24-1940 MRN: [id number]   Airway Documentation:  Airway 7.5 mm (Active)  Secured at (cm) 22 cm 07/16/2012 11:50 AM  Measured From Lips 07/16/2012 11:50 AM  Secured Location Center 07/16/2012 11:50 AM  Secured By [employer] 07/16/2012 11:50 AM  Tube Holder Repositioned Yes 07/16/2012 11:50 AM  Cuff Pressure (cm H2O) 25 cm H2O 07/15/2012  7:24 PM  Site Condition Dry 07/16/2012  3:10 AM    Evaluation  O2 sats: 100  Complications: No apparent complications Patient did tolerate procedure well. Bilateral Breath Sounds: Clear;Diminished Suctioning: Airway Yes  Per MD ok to extubate with neg cuff leak. Pt had good cough/gag. Pt is awake and can follow all commands. Sxn oral before extubation. No stridor. Placed pt on 4L [place]. Sats 100%.  Kandis Nab 07/16/2012, 4:32 PM

## 2012-07-16 NOTE — Progress Notes (Signed)
Inpatient Diabetes Program Recommendations  AACE/ADA: New Consensus Statement on Inpatient Glycemic Control (2013)  Target Ranges:  Prepandial:   less than 140 mg/dL      Peak postprandial:   less than 180 mg/dL (1-2 hours)      Critically ill patients:  140 - 180 mg/dL   Reason for Visit: Glycemic control issues yesterday  Inpatient Diabetes Program Recommendations HgbA1C: No Hgb A1C known.  Note:  Request MD order for Hgb A1C.  Thank you.  Kirstein Baxley S. Elsie Lincoln, RN, CNS, CDE Inpatient Diabetes Program, team pager [phone]

## 2012-07-16 NOTE — Progress Notes (Signed)
Hypoglycemic Event  CBG: 54 at 0010  Treatment: 15 ml dextrose  Symptoms: none  Follow-up CBG: Time:0033 CBG Result:111  Possible Reasons for Event: NPO Comments/MD notified:    Nelson Chimes  Remember to initiate Hypoglycemia Order Set & complete

## 2012-07-16 NOTE — Progress Notes (Signed)
Placed on SBT.

## 2012-07-16 NOTE — Progress Notes (Signed)
Name: Hannah [name] MRN: [id number] DOB: 1940-08-19  ELECTRONIC ICU PHYSICIAN NOTE  Problem:  nausea  Intervention:  zofran 4 mg IV  Bary Castilla 07/16/2012, 3:03 AM

## 2012-07-17 ENCOUNTER — Encounter (HOSPITAL_COMMUNITY): Payer: Self-pay

## 2012-07-17 ENCOUNTER — Telehealth: Payer: Self-pay | Admitting: Vascular Surgery

## 2012-07-17 DIAGNOSIS — I2699 Other pulmonary embolism without acute cor pulmonale: Secondary | ICD-10-CM

## 2012-07-17 LAB — GLUCOSE, CAPILLARY
Glucose-Capillary: 103 mg/dL — ABNORMAL HIGH (ref 70–99)
Glucose-Capillary: 109 mg/dL — ABNORMAL HIGH (ref 70–99)
Glucose-Capillary: 113 mg/dL — ABNORMAL HIGH (ref 70–99)

## 2012-07-17 LAB — COMPREHENSIVE METABOLIC PANEL
BUN: 20 mg/dL (ref 6–23)
CO2: 17 mEq/L — ABNORMAL LOW (ref 19–32)
Calcium: 7.1 mg/dL — ABNORMAL LOW (ref 8.4–10.5)
Chloride: 106 mEq/L (ref 96–112)
Creatinine, Ser: 1.27 mg/dL — ABNORMAL HIGH (ref 0.50–1.10)
GFR calc non Af Amer: 41 mL/min — ABNORMAL LOW (ref >90)
Total Bilirubin: 0.3 mg/dL (ref 0.3–1.2)

## 2012-07-17 LAB — CBC
MCV: 87.7 fL (ref 78.0–100.0)
Platelets: 153 10*3/uL (ref 150–400)
RBC: 3.08 MIL/uL — ABNORMAL LOW (ref 3.87–5.11)
RDW: 14.8 % (ref 11.5–15.5)
WBC: 13.3 10*3/uL — ABNORMAL HIGH (ref 4.0–10.5)

## 2012-07-17 LAB — PROTIME-INR: Prothrombin Time: 13.5 seconds (ref 11.6–15.2)

## 2012-07-17 LAB — PROCALCITONIN: Procalcitonin: 1.79 ng/mL

## 2012-07-17 MED ORDER — INSULIN ASPART 100 UNIT/ML ~~LOC~~ SOLN
0.0000 [IU] | Freq: Three times a day (TID) | SUBCUTANEOUS | Status: DC
  Administered 2012-07-17 – 2012-07-18 (×2): 1 [IU] via SUBCUTANEOUS
  Administered 2012-07-18: 2 [IU] via SUBCUTANEOUS
  Administered 2012-07-18 – 2012-07-20 (×5): 1 [IU] via SUBCUTANEOUS
  Administered 2012-07-20 – 2012-07-25 (×5): 2 [IU] via SUBCUTANEOUS
  Administered 2012-07-25 – 2012-07-26 (×3): 1 [IU] via SUBCUTANEOUS

## 2012-07-17 MED ORDER — METOPROLOL TARTRATE 25 MG PO TABS
25.0000 mg | ORAL_TABLET | Freq: Two times a day (BID) | ORAL | Status: DC
  Administered 2012-07-17 – 2012-07-26 (×19): 25 mg via ORAL
  Filled 2012-07-17 (×22): qty 1

## 2012-07-17 MED ORDER — POTASSIUM CHLORIDE CRYS ER 20 MEQ PO TBCR
40.0000 meq | EXTENDED_RELEASE_TABLET | ORAL | Status: AC
  Administered 2012-07-17 (×2): 40 meq via ORAL
  Filled 2012-07-17 (×2): qty 2

## 2012-07-17 MED ORDER — PHENOL 1.4 % MT LIQD
1.0000 | OROMUCOSAL | Status: DC | PRN
  Administered 2012-07-17: 1 via OROMUCOSAL
  Filled 2012-07-17: qty 177

## 2012-07-17 NOTE — Progress Notes (Signed)
Off vent, awake and alert Left groin no hematoma  Needs follow up with me in a few weeks to determine if we will remove filter  Fabienne Bruns, MD [facility] Office: [phone] Pager: [phone]

## 2012-07-17 NOTE — Progress Notes (Signed)
PULMONARY  / CRITICAL CARE MEDICINE  Name: Hannah [name] MRN: [id number] DOB: 1940/01/04    ADMISSION DATE:  07/13/2012 CONSULTATION DATE:  07/13/2012  REFERRING MD :  EDP - Nanavati PRIMARY SERVICE: PCCM  CHIEF COMPLAINT:  SOB, diaphoresis  BRIEF PATIENT DESCRIPTION: Hannah [name] is a 72 yo female with a PMH of HTN, HL, DM who presented [facility]11. beiong managed post TNL, pea arrest massive PE.  SIGNIFICANT EVENTS / STUDIES:  7/11 >>> Admitted with PE, PEA arrest, given TNK 7/11 >>> CT of Chest: central and obstructive bilateral pulmonary embolism with right heart strain  7/12 Echo >>> EF 60%, RV mod dilat, decreased systolic fxn, RA mild dilation , PAP -45  7/13 >>> Sig drop in Hbg on Hep , no obvious signs of bleeding /oozing from IV sites, rectal, oral, urine. Given 4u PRBC, protamine, and 1u FFP 7/13 CT Abd Pelvis >>> Intramuscular R thigh hematoma present 7/14 >>> IVC filter placement (PLAN IS TO FOLLOW UP WITH Dr Darrick Penna to dc this when able) 7/14 extubated 7/15- nonocclusive rt leg dvt common fem, fem ,popliteal (pending official read)  LINES / TUBES: 7/11 ETT >> 7/14 7/11 R Fem CVC >> d/c'd 7/13 L ALine >> d/c'd 7/12 Foley >>>  CULTURES: 7/11 UC >> no growth  ANTIBIOTICS: None  SUBJECTIVE: extubated yesterday, had IVC filter placed  VITAL SIGNS: Temp:  [98.1 F (36.7 C)-99.1 F (37.3 C)] 99.1 F (37.3 C) (07/15 0352) Pulse Rate:  [100-125] 125 (07/14 1700) Resp:  [17-28] 17 (07/14 2000) BP: (101-180)/(39-79) 119/53 mmHg (07/15 0700) SpO2:  [89 %-100 %] 93 % (07/15 0700) Arterial Line BP: (135-170)/(47-61) 170/61 mmHg (07/14 1700) FiO2 (%):  [40 %] 40 % (07/14 1620) Weight:  [243 lb 6.2 oz (110.4 kg)] 243 lb 6.2 oz (110.4 kg) (07/15 0437) HEMODYNAMICS:   VENTILATOR SETTINGS: Vent Mode:  [-] CPAP;PSV FiO2 (%):  [40 %] 40 % PEEP:  [5 cmH20] 5 cmH20 Pressure Support:  [5 cmH20] 5 cmH20 INTAKE / OUTPUT: Intake/Output     07/14 0701 - 07/15 0700  07/15 0701 - 07/16 0700   I.V. (mL/kg) 691.8 (6.3)    Blood     IV Piggyback 100    Total Intake(mL/kg) 791.8 (7.2)    Urine (mL/kg/hr) 1595 (0.6)    Total Output 1595     Net -803.2            PHYSICAL EXAMINATION: General:  NAD Neuro:  Grossly nonfocal, speech intact HEENT:  NCAT Cardiovascular:  Tachycardic, regular rhythm Lungs:  Coarse BS bilaterally Abdomen:  Soft, nontender to palpation, +BS Musculoskeletal:  Extremities atraumatic, RLE swelling > LLE Skin:  No rashes noted  LABS:  Recent Labs Lab 07/13/12 0933  07/13/12 1105 07/13/12 1124  07/13/12 1700  07/13/12 2200  07/14/12 0500  07/14/12 1900 07/15/12 0307 07/15/12 0500  07/15/12 1600  07/16/12 0330 07/16/12 0336 07/16/12 0340 07/16/12 0345 07/16/12 0727 07/16/12 1300 07/16/12 2325  HGB 5.9*  --   --  12.6  < >  --   --  9.4*  --  8.5*  --   --   --  4.8*  < > 8.0*  < >  --   --   --  7.3*  --  7.4* 7.5*  WBC 8.0  --   --   --   < >  --   --  18.5*  --  18.4*  --   --   --  15.4*  < >  14.1*  < >  --   --   --  10.3  --  11.2* 11.0*  PLT 55*  --   --   --   < >  --   --  150  --  145*  --   --   --  131*  < > 101*  < >  --   --   --  83*  --  89* 98*  NA  --   < > 138 138  --   --   --   --   --  140  --  136  --  136  --  137  --   --   --   --  140  --   --   --   K  --   < > 4.2 4.3  --   --   --   --   --  2.8*  --  4.8  --  4.6  --  4.0  --   --   --   --  3.6  --   --   --   CL  --   < > 100 106  --   --   --   --   --  96  --  99  --  101  --  101  --   --   --   --  105  --   --   --   CO2  --   < > 16*  --   --   --   --   --   --  31  --  29  --  28  --  25  --   --   --   --  28  --   --   --   GLUCOSE  --   < > 334* 316*  --   --   --   --   --  296*  --  235*  --  298*  --  209*  --   --   --   --  149*  --   --   --   BUN  --   < > 24* 24*  --   --   --   --   --  35*  --  38*  --  41*  --  44*  --   --   --   --  42*  --   --   --   CREATININE  --   < > 1.56* 1.40*  --   --   --   --   --   1.90*  --  2.27*  --  2.25*  --  2.17*  --   --   --   --  1.98*  --   --   --   CALCIUM  --   < > 8.8  --   --   --   --   --   --  7.9*  --  7.4*  --  6.8*  --  6.9*  --   --   --   --  6.9*  --   --   --   MG  --   --   --   --   --   --   --   --   --  1.5  --   --   --  1.8  --   --   --   --   --   --   --   --   --   --   PHOS  --   --   --   --   --   --   --   --   --   --   --   --   --  4.4  --   --   --   --   --   --   --   --   --   --   AST  --   --  176*  --   --   --   --   --   --   --   --   --   --  67*  --   --   --   --   --   --  31  --   --   --   ALT  --   --  205*  --   --   --   --   --   --   --   --   --   --  148*  --   --   --   --   --   --  87*  --   --   --   ALKPHOS  --   --  52  --   --   --   --   --   --   --   --   --   --  44  --   --   --   --   --   --  42  --   --   --   BILITOT  --   --  0.4  --   --   --   --   --   --   --   --   --   --  0.3  --   --   --   --   --   --  0.5  --   --   --   PROT  --   --  5.0*  --   --   --   --   --   --   --   --   --   --  3.9*  --   --   --   --   --   --  4.3*  --   --   --   ALBUMIN  --   --  2.4*  --   --   --   --   --   --   --   --   --   --  1.8*  --   --   --   --   --   --  2.0*  --   --   --   APTT 54*  --   --   --   < > 108*  --  49*  --   --   --   --   --   --   --   --   --   --   --   --  23*  --   --   --   INR 2.17*  --   --   --   < > >10.00*  --  2.71*  --  1.59*  --   --   --   --   --   --   --   --   --   --  1.10  --   --   --   LATICACIDVEN  --   < >  --   --   < >  --   < >  --   --  6.8*  --  2.4*  --  2.6*  --  2.4*  --   --   --   --  1.0  --   --   --   TROPONINI <0.30  --   --   --   --   --   --   --   --   --   --   --   --  1.57*  < > 1.08*  --   --   --   --  0.83* 0.66*  --   --   PROCALCITON  --   --   --   --   --   --   --   --   --   --   < >  --   --  11.02  --  7.93  --   --  5.71  --   --   --   --   --   PROBNP 84.4  --   --   --   --   --   --   --   --   --   --   --   --   5711.0*  --   --   --   --   --   --  3809.0*  --   --   --   PHART  --   --   --   --   --   --   --   --   < >  --   --   --  7.398  --   --   --   --  7.443  --  7.441  --   --   --   --   PCO2ART  --   --   --   --   --   --   --   --   < >  --   --   --  44.5  --   --   --   --  40.7  --  40.5  --   --   --   --   PO2ART  --   --   --   --   --   --   --   --   < >  --   --   --  103.0*  --   --   --   --  83.9  --  83.0  --   --   --   --   < > = values in this interval not displayed.  Recent Labs Lab 07/16/12 1159 07/16/12 1639 07/16/12 2053 07/17/12 0016 07/17/12 0350  GLUCAP 182* 156* 110* 113* 138*    CXR: no film today  ASSESSMENT / PLAN:  PULMONARY A: Bilateral pulmonary embolism - via CT on 7/11  Acute Respiratory failure - r/t above, now resolved P:  - Now extubated no distress - See hematologic section -avoid hypoxia -IS active  CARDIOVASCULAR A: Pulmonary Embolism - as above  Hypotension  secondary to  massive PE , Hypovolemic/hemorrhagic shock, now off pressors Lactic Acid - 9.4 >6.8 >2.6>1.0 7/12 Echo >EF 60%, RV mod dilat, decreased systolic fxn, RA mild dilation , PAP -45  Troponin elevated - from PE Tachycardia P:  - KVO -tele -anticoagulation held for bleeding, IVC filter placed yesterday, plan is to DC this after re attempt of anticoagulation -restart home metoprolol today given tachycardia  RENAL A: AKI - likely r/t dye load and hypotension  Hypokalemia -improved  P:  - Monitor I&O  - Daily BMP - ordered for this AM - kvo, goal equal to pos balance - avoid nephrotoxins   GASTROINTESTINAL A: Transaminitis - r/t malperfusion and shock liver, LFT's downtrending P:  - check CMP this AM - Pepcid for GI Px  - Zofran prn - swallow screen, diet today  HEMATOLOGIC A:  Bilateral Pulmonary Embolism s/p TNK 7/11  DVT  Anemia s/p 4 U PRBC, 1u FFP and protamine x 1 7/13  Thrombocytopenia R thigh hematoma IVC placement 7/14 P:  - Daily PT  and INR - ordered for this AM - SCDs for DVT px once LE dopplers done - Transfuse for Hgb <7, cbc q12h, stable currently -will d/w vascular restart heparin no bolus in next 72 hrs? Still await cbc stability -again, plan filter removal after safely back on anticoagulation  INFECTIOUS A: No obvious infectious source with elevated WBC  And PCT bump. Lactate clearing . Suspect stress reaction .  P:  - Monitor WBC and fever curve - Pan -Culture if febrile   ENDOCRINE A: Hypothyroid  DM  P:  - synthroid IV dosed per pharmacy  - CBG Q4H + SSI  NEUROLOGIC A: Acute Encephalopathy - r/t sedation and ARF>resolved  P:  - monitor mental status -dvt pos, non mobile, not on heparin, so may prolong ambulation start, avoid filter obstruction  TODAY'S SUMMARY: transfer to stepdown, f/u CMET and coags  Levert Feinstein, MD Family Medicine PGY-2   I have personally obtained a history, examined the patient, evaluated laboratory and imaging results, formulated the assessment and plan and placed orders.  Mcarthur Rossetti. Tyson Alias, MD, FACP Pgr: [phone] [place]  Pulmonary and Critical Care Medicine [facility] Pager: [phone]  07/17/2012, 7:51 AM

## 2012-07-17 NOTE — Progress Notes (Signed)
VASCULAR LAB PRELIMINARY  PRELIMINARY  PRELIMINARY  PRELIMINARY  Bilateral lower extremity venous duplex  completed.    Preliminary report:  Right:  Non occlusive DVT noted in the CFV, proximal FV.  Unable to adequately visualize FV past the proximal portion.  Non occlusive DVT in the popliteal vein.  No evidence of superficial thrombosis.  No Baker's cyst.  Left:  Possible  DVT noted in the mid and distal FV.  These portions not well visualized.  No evidence of superficial thrombosis.  No Baker's cyst.   Kyndahl Jablon, RVT 07/17/2012, 10:47 AM

## 2012-07-17 NOTE — Telephone Encounter (Addendum)
Message copied by Rosalyn Charters on Tue Jul 17, 2012 10:39 AM ------      Message from: Melene Plan      Created: Mon Jul 16, 2012  4:34 PM       IGNORE INFO ON OTHER 2 PTS      ----- Message -----         From: [name], MD         Sent: 07/16/2012   4:08 PM           To: Reuel Derby, Melene Plan, RN            Ultrasound both groins, IVC gram, IVC filter      She needs a follow up appt with me in 2-3 weeks with bilat duplex to rule out DVT and consider filter removal.            Also             Right CEA Lara Mulch, [place] asst            Remove Right IJ diatek, place left IJ diatek and ultrasound on [place]       ------  l/v/m notifying patient of fu appt. on 08-16-13 10:00 for duplex and then 08-23-12 to see dr.  Darrick Penna

## 2012-07-18 LAB — CBC
HCT: 23.1 % — ABNORMAL LOW (ref 36.0–46.0)
HCT: 27.9 % — ABNORMAL LOW (ref 36.0–46.0)
Hemoglobin: 7.6 g/dL — ABNORMAL LOW (ref 12.0–15.0)
Hemoglobin: 9.3 g/dL — ABNORMAL LOW (ref 12.0–15.0)
MCH: 29.4 pg (ref 26.0–34.0)
MCHC: 32.9 g/dL (ref 30.0–36.0)
MCHC: 33.3 g/dL (ref 30.0–36.0)
MCV: 88.3 fL (ref 78.0–100.0)
RDW: 14.5 % (ref 11.5–15.5)
WBC: 12.2 10*3/uL — ABNORMAL HIGH (ref 4.0–10.5)

## 2012-07-18 LAB — GLUCOSE, CAPILLARY
Glucose-Capillary: 134 mg/dL — ABNORMAL HIGH (ref 70–99)
Glucose-Capillary: 142 mg/dL — ABNORMAL HIGH (ref 70–99)
Glucose-Capillary: 74 mg/dL (ref 70–99)

## 2012-07-18 LAB — BASIC METABOLIC PANEL
BUN: 22 mg/dL (ref 6–23)
Calcium: 8.3 mg/dL — ABNORMAL LOW (ref 8.4–10.5)
Creatinine, Ser: 1.09 mg/dL (ref 0.50–1.10)
GFR calc non Af Amer: 50 mL/min — ABNORMAL LOW (ref >90)
Glucose, Bld: 138 mg/dL — ABNORMAL HIGH (ref 70–99)

## 2012-07-18 MED ORDER — ALBUTEROL SULFATE (5 MG/ML) 0.5% IN NEBU: 2.5000 mg | INHALATION_SOLUTION | Freq: Four times a day (QID) | RESPIRATORY_TRACT | Status: DC | PRN

## 2012-07-18 MED ORDER — ALBUTEROL SULFATE (5 MG/ML) 0.5% IN NEBU
2.5000 mg | INHALATION_SOLUTION | Freq: Four times a day (QID) | RESPIRATORY_TRACT | Status: DC
  Administered 2012-07-18 – 2012-07-24 (×22): 2.5 mg via RESPIRATORY_TRACT
  Filled 2012-07-18 (×25): qty 0.5

## 2012-07-18 MED ORDER — LEVOTHYROXINE SODIUM 50 MCG PO TABS
50.0000 ug | ORAL_TABLET | Freq: Every day | ORAL | Status: DC
  Administered 2012-07-19 – 2012-07-26 (×8): 50 ug via ORAL
  Filled 2012-07-18 (×11): qty 1

## 2012-07-18 MED ORDER — GLIPIZIDE 5 MG PO TABS
5.0000 mg | ORAL_TABLET | Freq: Two times a day (BID) | ORAL | Status: DC
  Administered 2012-07-18 – 2012-07-22 (×8): 5 mg via ORAL
  Filled 2012-07-18 (×10): qty 1

## 2012-07-18 MED ORDER — METFORMIN HCL 500 MG PO TABS
500.0000 mg | ORAL_TABLET | Freq: Two times a day (BID) | ORAL | Status: DC
  Administered 2012-07-18 – 2012-07-22 (×8): 500 mg via ORAL
  Filled 2012-07-18 (×10): qty 1

## 2012-07-18 MED ORDER — ALBUTEROL SULFATE (5 MG/ML) 0.5% IN NEBU
INHALATION_SOLUTION | RESPIRATORY_TRACT | Status: AC
  Administered 2012-07-18: 2.5 mg via RESPIRATORY_TRACT
  Filled 2012-07-18: qty 0.5

## 2012-07-18 MED ORDER — ALBUTEROL SULFATE (5 MG/ML) 0.5% IN NEBU
2.5000 mg | INHALATION_SOLUTION | RESPIRATORY_TRACT | Status: DC | PRN
  Administered 2012-07-21 – 2012-07-22 (×2): 2.5 mg via RESPIRATORY_TRACT
  Filled 2012-07-18: qty 0.5

## 2012-07-18 NOTE — Progress Notes (Signed)
PULMONARY  / CRITICAL CARE MEDICINE  Name: Hannah [name] MRN: [id number] DOB: 1940-05-23    ADMISSION DATE:  07/13/2012 CONSULTATION DATE:  07/13/2012  REFERRING MD :  [facility] - [place] PRIMARY SERVICE: PCCM  CHIEF COMPLAINT:  SOB, diaphoresis  BRIEF PATIENT DESCRIPTION: [name] is a 72 yo female with a PMH of HTN, HL, DM who presented to [facility] on 7/11. beiong managed post TNL, pea arrest massive PE.  SIGNIFICANT EVENTS / STUDIES:  7/11 >>> Admitted with PE, PEA arrest, given TNK 7/11 >>> CT of Chest: central and obstructive bilateral pulmonary embolism with right heart strain  7/12 Echo >>> EF 60%, RV mod dilat, decreased systolic fxn, RA mild dilation , PAP -45  7/13 >>> Sig drop in Hbg on Hep , no obvious signs of bleeding /oozing from IV sites, rectal, oral, urine. Given 4u PRBC, protamine, and 1u FFP 7/13 CT Abd Pelvis >>> Intramuscular R thigh hematoma present 7/14 >>> IVC filter placement (PLAN IS TO FOLLOW UP WITH Dr Darrick Penna to dc this when able) 7/14 extubated 7/15- nonocclusive rt leg dvt common fem, fem ,popliteal (pending official read)  LINES / TUBES: 7/11 ETT >> 7/14 7/11 R Fem CVC >> d/c'd 7/13 L ALine >> d/c'd 7/12 Foley >>>  CULTURES: 7/11 UC >> no growth  ANTIBIOTICS: None  SUBJECTIVE:  Hoarse and mildly stridorous. NAD. No new complaints  VITAL SIGNS: Temp:  [98.2 F (36.8 C)-99.5 F (37.5 C)] 99.2 F (37.3 C) (07/16 1225) Pulse Rate:  [68-107] 94 (07/16 1225) Resp:  [19-27] 19 (07/16 1225) BP: (126-152)/(63-80) 126/63 mmHg (07/16 1225) SpO2:  [93 %-100 %] 99 % (07/16 1501) HEMODYNAMICS:   VENTILATOR SETTINGS:   INTAKE / OUTPUT: Intake/Output     07/15 0701 - 07/16 0700 07/16 0701 - 07/17 0700   P.O.  60   I.V. (mL/kg) 50 (0.5)    IV Piggyback 100 50   Total Intake(mL/kg) 150 (1.4) 110 (1)   Urine (mL/kg/hr) 826 (0.3) 227 (0.2)   Total Output 826 227   Net -676 -117        Urine Occurrence 2 x      PHYSICAL  EXAMINATION: General:  NAD Neuro: No focal deficits HEENT: WNL Cardiovascular: RRR s M Lungs: stridor transmitted throughout chest, few scattered wheezes Abdomen:  Soft, nontender to palpation, +BS Musculoskeletal:  Extremities atraumatic, RLE swelling > LLE Skin:  No rashes noted  LABS:  Recent Labs Lab 07/13/12 1124  07/13/12 1700  07/13/12 2200  07/14/12 0500  07/14/12 1900 07/15/12 0307 07/15/12 0500  07/15/12 1600  07/16/12 0330 07/16/12 0336 07/16/12 0340 07/16/12 0345 07/16/12 0727  07/16/12 2325 07/17/12 0740 07/17/12 1110 07/17/12 1201 07/17/12 1611 07/18/12 0015 07/18/12 0032  HGB 12.6  < >  --   --  9.4*  --  8.5*  --   --   --  4.8*  < > 8.0*  < >  --   --   --  7.3*  --   < > 7.5*  --   --  9.0*  --  7.6*  --   WBC  --   < >  --   --  18.5*  --  18.4*  --   --   --  15.4*  < > 14.1*  < >  --   --   --  10.3  --   < > 11.0*  --   --  13.3*  --  12.2*  --  PLT  --   < >  --   --  150  --  145*  --   --   --  131*  < > 101*  < >  --   --   --  83*  --   < > 98*  --   --  153  --  138*  --   NA 138  --   --   --   --   --  140  --  136  --  136  --  137  --   --   --   --  140  --   --   --   --  137  --   --  140  --   K 4.3  --   --   --   --   --  2.8*  --  4.8  --  4.6  --  4.0  --   --   --   --  3.6  --   --   --   --  2.9*  --   --  4.5  --   CL 106  --   --   --   --   --  96  --  99  --  101  --  101  --   --   --   --  105  --   --   --   --  106  --   --  105  --   CO2  --   --   --   --   --   --  31  --  29  --  28  --  25  --   --   --   --  28  --   --   --   --  17*  --   --  28  --   GLUCOSE 316*  --   --   --   --   --  296*  --  235*  --  298*  --  209*  --   --   --   --  149*  --   --   --   --  300*  --   --  138*  --   BUN 24*  --   --   --   --   --  35*  --  38*  --  41*  --  44*  --   --   --   --  42*  --   --   --   --  20  --   --  22  --   CREATININE 1.40*  --   --   --   --   --  1.90*  --  2.27*  --  2.25*  --  2.17*  --   --   --    --  1.98*  --   --   --   --  1.27*  --   --  1.09  --   CALCIUM  --   --   --   --   --   --  7.9*  --  7.4*  --  6.8*  --  6.9*  --   --   --   --  6.9*  --   --   --   --  7.1*  --   --  8.3*  --   MG  --   --   --   --   --   --  1.5  --   --   --  1.8  --   --   --   --   --   --   --   --   --   --   --   --   --   --   --   --   PHOS  --   --   --   --   --   --   --   --   --   --  4.4  --   --   --   --   --   --   --   --   --   --   --   --   --   --   --   --   AST  --   --   --   --   --   --   --   --   --   --  55*  --   --   --   --   --   --  31  --   --   --   --  14  --   --   --   --   ALT  --   --   --   --   --   --   --   --   --   --  148*  --   --   --   --   --   --  87*  --   --   --   --  12  --   --   --   --   ALKPHOS  --   --   --   --   --   --   --   --   --   --  44  --   --   --   --   --   --  42  --   --   --   --  157*  --   --   --   --   BILITOT  --   --   --   --   --   --   --   --   --   --  0.3  --   --   --   --   --   --  0.5  --   --   --   --  0.3  --   --   --   --   PROT  --   --   --   --   --   --   --   --   --   --  3.9*  --   --   --   --   --   --  4.3*  --   --   --   --  5.1*  --   --   --   --   ALBUMIN  --   --   --   --   --   --   --   --   --   --  1.8*  --   --   --   --   --   --  2.0*  --   --   --   --  1.2*  --   --   --   --   APTT  --   < > 108*  --  49*  --   --   --   --   --   --   --   --   --   --   --   --  23*  --   --   --   --   --   --   --   --   --   INR  --   < > >10.00*  --  2.71*  --  1.59*  --   --   --   --   --   --   --   --   --   --  1.10  --   --   --   --   --   --  1.05  --   --   LATICACIDVEN  --   < >  --   < >  --   --  6.8*  --  2.4*  --  2.6*  --  2.4*  --   --   --   --  1.0  --   --   --   --   --   --   --   --   --   TROPONINI  --   --   --   --   --   --   --   --   --   --  1.57*  < > 1.08*  --   --   --   --  0.83* 0.66*  --   --   --   --   --   --   --   --   PROCALCITON  --   --   --   --   --    --   --   < >  --   --  11.02  --  7.93  --   --  5.71  --   --   --   --   --  1.79  --   --   --   --   --   PROBNP  --   --   --   --   --   --   --   --   --   --  5711.0*  --   --   --   --   --   --  3809.0*  --   --   --   --   --   --   --   --  8731.0*  PHART  --   --   --   --   --   < >  --   --   --  7.398  --   --   --   --  7.443  --  7.441  --   --   --   --   --   --   --   --   --   --   PCO2ART  --   --   --   --   --   < >  --   --   --  44.5  --   --   --   --  40.7  --  40.5  --   --   --   --   --   --   --   --   --   --   PO2ART  --   --   --   --   --   < >  --   --   --  103.0*  --   --   --   --  83.9  --  83.0  --   --   --   --   --   --   --   --   --   --   < > = values in this interval not displayed.  Recent Labs Lab 07/17/12 1205 07/17/12 1707 07/17/12 2137 07/18/12 0753 07/18/12 1150  GLUCAP 109* 146* 160* 165* 134*    CXR: NNF  ASSESSMENT / PLAN:  PULMONARY A: Bilateral pulmonary embolism, s/p IVC filter Acute Respiratory failure, resolved Mild stridor - likely post extubation laryngeal edema Mild wheezes/bronchospasm P:  Cont supplemental O2 Change BDs to schedule Cont to monitor in SDU  CARDIOVASCULAR A:   Hypotension, resolved Elevated troponin elevated, likely due to PE. No further eval indicated Tachycardia, resolved P:  Cont tele Cont current Rx  RENAL A: AKI, resolved Hypokalemia, resolved P:  Monitor BMET intermittently Correct electrolytes as indicated  GASTROINTESTINAL A: Transaminitis, resolved P:  SUP N/I - D/C'd 7/16 Cont CHO mod diet   HEMATOLOGIC A:  Bilateral Pulmonary Embolism s/p TNK 7/11  RLE DVT  Acute blood loss anemia s/p 4 U PRBC, 1u FFP and protamine x 1 7/13  Thrombocytopenia, mild R thigh hematoma P:  Monitor coags Resume anticoagulation when deemed safe Would consider leaving IVC filter permanently as she suffered PE while on warfarin   INFECTIOUS A: No evidence of infection  P:  Micro  and abx as above   ENDOCRINE A: Hypothyroid  DM  P:  Change synthroid to PO 7/16 Resume glyburide and metformin Cont ACHS SSI  NEUROLOGIC A: Acute Encephalopathy, resolved P:  Monitor   Billy Fischer, MD ; [facility] service [place] [phone].  After 5:30 PM or weekends, call [phone]

## 2012-07-18 NOTE — Progress Notes (Addendum)
NUTRITION FOLLOW UP  Intervention:    Glucerna Shake po TID, each supplement provides 220 kcal and 10 grams of protein.  Nutrition Dx:   Inadequate oral intake now related to sore throat and poor appetite as evidenced by poor intake of meals.  New Goal:   Intake to meet >90% of estimated nutrition needs. Unmet.  Monitor:   PO intake, labs, weight trend.  Assessment:   Patient was extubated on 6/14. Diet advanced to CHO-modified medium calorie 6/15. Per discussion with RN, patient has been eating poorly, c/o throat pain.  Height: Ht Readings from Last 1 Encounters:  07/17/12 5\' 7"  (1.702 m)    Weight Status:  Trending up with fluid status Wt Readings from Last 1 Encounters:  07/17/12 237 lb 10.5 oz (107.8 kg)  07/14/12  222 lb 14.2 oz (101.1 kg)   Re-estimated needs:  Kcal: 1600-1800 Protein: 90-100 gm Fluid: 1.6-1.8 L  Skin: no issues  Diet Order: Carb Control   Intake/Output Summary (Last 24 hours) at 07/18/12 1528 Last data filed at 07/18/12 1416  Gross per 24 hour  Intake    160 ml  Output    228 ml  Net    -68 ml    Last BM: 7/13   Labs:   Recent Labs Lab 07/13/12 1124 07/14/12 0500 07/14/12 1900 07/15/12 0500  07/16/12 0345 07/17/12 1110 07/18/12 0015  NA 138 140 136 136  < > 140 137 140  K 4.3 2.8* 4.8 4.6  < > 3.6 2.9* 4.5  CL 106 96 99 101  < > 105 106 105  CO2  --  31 29 28   < > 28 17* 28  BUN 24* 35* 38* 41*  < > 42* 20 22  CREATININE 1.40* 1.90* 2.27* 2.25*  < > 1.98* 1.27* 1.09  CALCIUM  --  7.9* 7.4* 6.8*  < > 6.9* 7.1* 8.3*  MG  --  1.5  --  1.8  --   --   --   --   PHOS  --   --   --  4.4  --   --   --   --   GLUCOSE 316* 296* 235* 298*  < > 149* 300* 138*  < > = values in this interval not displayed.  CBG (last 3)   Recent Labs  07/17/12 2137 07/18/12 0753 07/18/12 1150  GLUCAP 160* 165* 134*    Scheduled Meds: . albuterol  2.5 mg Nebulization Q6H  . glipiZIDE  5 mg Oral BID AC  . insulin aspart  0-9 Units  Subcutaneous TID WC  . [START ON 07/19/2012] levothyroxine  50 mcg Oral QAC breakfast  . metFORMIN  500 mg Oral BID WC  . metoprolol tartrate  25 mg Oral BID  . sodium chloride  1,000 mL Intravenous Once    Continuous Infusions: None   Joaquin Courts, RD, LDN, CNSC Pager [phone] After Hours Pager [phone]

## 2012-07-18 NOTE — Progress Notes (Signed)
eLink Physician-Brief Progress Note Patient Name: AKERIA HEDSTROM DOB: 18-Jul-1940 MRN: [id number]  Date of Service  07/18/2012   HPI/Events of Note   milad asymptomatic wheeze per RN No overt bleeding  eICU Interventions  Alb prn Check bnp   Intervention Category Intermediate Interventions: Other:  Cainan Trull 07/18/2012, 12:35 AM

## 2012-07-18 NOTE — Progress Notes (Signed)
Hypoglycemic Event  CBG: 68  Treatment: 15 GM carbohydrate snack  Symptoms: None  Follow-up CBG: Time:2154 CBG Result:67  Possible Reasons for Event: Unknown  Comments/MD notified:None    Mclean Moya, Lauree Chandler  Remember to initiate Hypoglycemia Order Set & complete

## 2012-07-18 NOTE — Progress Notes (Signed)
Hypoglycemic Event  CBG: 67  Treatment: 15 GM carbohydrate snack  Symptoms: None  Follow-up CBG: Time:2215 CBG Result:74  Possible Reasons for Event: Unknown  Comments/MD notified:none    Hannah [name], Lauree Chandler  Remember to initiate Hypoglycemia Order Set & complete

## 2012-07-19 LAB — CBC
MCH: 29.2 pg (ref 26.0–34.0)
MCV: 88.4 fL (ref 78.0–100.0)
Platelets: 190 10*3/uL (ref 150–400)
RBC: 3.01 MIL/uL — ABNORMAL LOW (ref 3.87–5.11)

## 2012-07-19 LAB — CBC WITH DIFFERENTIAL/PLATELET
Basophils Absolute: 0 10*3/uL (ref 0.0–0.1)
Basophils Relative: 0 % (ref 0–1)
Eosinophils Absolute: 0.3 10*3/uL (ref 0.0–0.7)
HCT: 27.4 % — ABNORMAL LOW (ref 36.0–46.0)
Hemoglobin: 9.3 g/dL — ABNORMAL LOW (ref 12.0–15.0)
MCH: 30 pg (ref 26.0–34.0)
MCHC: 33.9 g/dL (ref 30.0–36.0)
Monocytes Absolute: 1.2 10*3/uL — ABNORMAL HIGH (ref 0.1–1.0)
Monocytes Relative: 11 % (ref 3–12)
RDW: 14.4 % (ref 11.5–15.5)

## 2012-07-19 LAB — GLUCOSE, CAPILLARY
Glucose-Capillary: 128 mg/dL — ABNORMAL HIGH (ref 70–99)
Glucose-Capillary: 146 mg/dL — ABNORMAL HIGH (ref 70–99)

## 2012-07-19 LAB — BASIC METABOLIC PANEL
Calcium: 8.7 mg/dL (ref 8.4–10.5)
Chloride: 103 mEq/L (ref 96–112)
Creatinine, Ser: 0.97 mg/dL (ref 0.50–1.10)
GFR calc Af Amer: 67 mL/min — ABNORMAL LOW (ref >90)

## 2012-07-19 MED ORDER — HEPARIN (PORCINE) IN NACL 100-0.45 UNIT/ML-% IJ SOLN
1500.0000 [IU]/h | INTRAMUSCULAR | Status: DC
  Administered 2012-07-19: 650 [IU]/h via INTRAVENOUS
  Administered 2012-07-20: 950 [IU]/h via INTRAVENOUS
  Administered 2012-07-21: 1500 [IU]/h via INTRAVENOUS
  Filled 2012-07-19 (×4): qty 250

## 2012-07-19 MED ORDER — PREDNISONE 20 MG PO TABS
20.0000 mg | ORAL_TABLET | Freq: Every day | ORAL | Status: AC
  Administered 2012-07-20 – 2012-07-22 (×3): 20 mg via ORAL
  Filled 2012-07-19 (×4): qty 1

## 2012-07-19 NOTE — Evaluation (Signed)
Occupational Therapy Evaluation Patient Details Name: Hannah [name] MRN: [id number] DOB: 04/23/1940 Today's Date: 07/19/2012 Time: 4782-9562 OT Time Calculation (min): 28 min  OT Assessment / Plan / Recommendation History of present illness Pt admit fro PEA arrest, Massive PE, and bil LE DVTs.  IVC filter in place.     Clinical Impression   Pt presenting with poor activity tolerance and generalized weakness along with below problem list. Will continue to follow pt acutely in order to address below problem list. Pt's family reporting they will be able to provide 24/7 assist and pt will be able to go home with daughter.    OT Assessment  Patient needs continued OT Services    Follow Up Recommendations  Home health OT;Supervision/Assistance - 24 hour    Barriers to Discharge      Equipment Recommendations    TBD   Recommendations for Other Services    Frequency  Min 2X/week    Precautions / Restrictions Precautions Precautions: Fall Restrictions Weight Bearing Restrictions: No   Pertinent Vitals/Pain Pt >93% on 2L nasal cannula with activity    ADL  Grooming: Performed;Wash/dry face;Set up Where Assessed - Grooming: Supported sitting Upper Body Bathing: Simulated;Supervision/safety Where Assessed - Upper Body Bathing: Unsupported sitting Lower Body Bathing: Simulated;Minimal assistance Where Assessed - Lower Body Bathing: Supported sit to stand Upper Body Dressing: Performed;Minimal assistance Where Assessed - Upper Body Dressing: Unsupported sitting Lower Body Dressing: Simulated;Moderate assistance Where Assessed - Lower Body Dressing: Supported sit to stand Toilet Transfer: Chief of Staff: Patient Percentage: 60% Statistician Method: Sit to Barista:  (chair) Equipment Used: Gait belt;Rolling walker Transfers/Ambulation Related to ADLs: +2 total assist for sit<>stand from chair. Min assist with RW ambulating  in room. VCs for technique. ADL Comments: 3/4 dyspnea throughout session.  Pt easily fatigues.    OT Diagnosis: Generalized weakness  OT Problem List: Decreased strength;Decreased activity tolerance;Impaired balance (sitting and/or standing);Decreased knowledge of use of DME or AE;Cardiopulmonary status limiting activity OT Treatment Interventions: Self-care/ADL training;Energy conservation;DME and/or AE instruction;Therapeutic activities;Patient/family education;Balance training   OT Goals(Current goals can be found in the care plan section) Acute Rehab OT Goals Patient Stated Goal: to return home with family OT Goal Formulation: With patient Time For Goal Achievement: 08/02/12 Potential to Achieve Goals: Good  Visit Information  Last OT Received On: 07/19/12 Assistance Needed: +2 PT/OT Co-Evaluation/Treatment: Yes History of Present Illness: Pt admit fro PEA arrest, Massive PE, and bil LE DVTs.  IVC filter in place.         Prior Functioning     Home Living Family/patient expects to be discharged to:: Private residence Living Arrangements: Alone Available Help at Discharge: Family;Available 24 hours/day Type of Home: House Home Access: Stairs to enter Home Equipment: Tub bench Prior Function Level of Independence: Independent         Vision/Perception     Cognition  Cognition Arousal/Alertness: Awake/alert Behavior During Therapy: WFL for tasks assessed/performed Overall Cognitive Status: Within Functional Limits for tasks assessed    Extremity/Trunk Assessment Upper Extremity Assessment Upper Extremity Assessment: Overall WFL for tasks assessed     Mobility Bed Mobility Bed Mobility: Not assessed Details for Bed Mobility Assistance: Pt up in chair. Transfers Transfers: Sit to Stand;Stand to Sit Sit to Stand: 1: +2 Total assist;From chair/3-in-1;With armrests;With upper extremity assist Sit to Stand: Patient Percentage: 60% Stand to Sit: 3: Mod assist;To  chair/3-in-1;With upper extremity assist;With armrests Details for Transfer Assistance: Assist for power up from  chair and to control descent. VCs for safe hand placement.     Exercise     Balance     End of Session OT - End of Session Equipment Utilized During Treatment: Gait belt;Oxygen;Rolling walker Activity Tolerance: Patient limited by fatigue Patient left: in chair;with call bell/phone within reach;with family/visitor present Nurse Communication: Mobility status  GO   07/19/2012 Hannah [name] OTR/L Pager [phone] Office [phone]   Hannah [name] 07/19/2012, 1:29 PM

## 2012-07-19 NOTE — Progress Notes (Addendum)
PULMONARY  / CRITICAL CARE MEDICINE  Name: Hannah [name] MRN: [id number] DOB: [birth year]    ADMISSION DATE:  07/13/2012 CONSULTATION DATE:  07/13/2012  REFERRING MD :  [facility] - [facility] PRIMARY SERVICE: PCCM  CHIEF COMPLAINT:  SOB, diaphoresis  BRIEF PATIENT DESCRIPTION: Hannah [name] is a 72 yo female with a PMH of HTN, HL, DM who presented to [facility] on 7/11. beiong managed post TNL, pea arrest massive PE.  SIGNIFICANT EVENTS / STUDIES:  7/11 >>> Admitted with PE, PEA arrest, given TNK 7/11 >>> CT of Chest: central and obstructive bilateral pulmonary embolism with right heart strain  7/12 Echo >>> EF 60%, RV mod dilat, decreased systolic fxn, RA mild dilation , PAP -45  7/13 >>> Sig drop in Hbg on Hep , no obvious signs of bleeding /oozing from IV sites, rectal, oral, urine. Given 4u PRBC, protamine, and 1u FFP 7/13 CT Abd Pelvis >>> Intramuscular R thigh hematoma present 7/14 >>> IVC filter placement 7/14 extubated 7/15- nonocclusive rt leg dvt common fem, fem ,popliteal. Cannot exclude dvt involving left femoral vein   LINES / TUBES: 7/11 ETT >> 7/14 7/11 R Fem CVC >> d/c'd 7/13 L ALine >> d/c'd 7/12 Foley >>> d/c'd  CULTURES: 7/11 UC >> no growth  ANTIBIOTICS: None  SUBJECTIVE:  No SOB, voice unchanged  VITAL SIGNS: Temp:  [98.4 F (36.9 C)-99.2 F (37.3 C)] 98.6 F (37 C) (07/17 0801) Pulse Rate:  [100-113] 102 (07/17 0801) Resp:  [18-23] 18 (07/17 0801) BP: (133-156)/(74-88) 156/82 mmHg (07/17 0801) SpO2:  [96 %-100 %] 100 % (07/17 0801) Weight:  [231 lb 7.7 oz (105 kg)] 231 lb 7.7 oz (105 kg) (07/17 0440) HEMODYNAMICS:   VENTILATOR SETTINGS:   INTAKE / OUTPUT: Intake/Output     07/16 0701 - 07/17 0700 07/17 0701 - 07/18 0700   P.O. 60 240   I.V. (mL/kg)     IV Piggyback 50    Total Intake(mL/kg) 110 (1) 240 (2.3)   Urine (mL/kg/hr) 527 (0.2) 300 (0.5)   Total Output 527 300   Net -417 -60        Urine Occurrence 2 x    Stool Occurrence 1 x       PHYSICAL EXAMINATION: General:  NAD, coughing often, voice horse Neuro: No focal deficits HEENT: mild stridor Cardiovascular: RRR s M Lungs: stridor transmitted throughout chest Abdomen:  Soft, nontender to palpation, +BS Musculoskeletal:  Extremities atraumatic, RLE swelling > LLE, upper rt unchanged Skin:  No rashes noted  LABS:  Recent Labs Lab 07/13/12 1124  07/13/12 1700  07/13/12 2200  07/14/12 0500  07/14/12 1900 07/15/12 0307 07/15/12 0500  07/15/12 1600  07/16/12 0330 07/16/12 0336 07/16/12 0340 07/16/12 0345 07/16/12 0727  07/17/12 0740 07/17/12 1110  07/17/12 1611 07/18/12 0015 07/18/12 0032 07/18/12 1831 07/19/12 0020  HGB 12.6  < >  --   --  9.4*  --  8.5*  --   --   --  4.8*  < > 8.0*  < >  --   --   --  7.3*  --   < >  --   --   < >  --  7.6*  --  9.3* 8.8*  WBC  --   < >  --   --  18.5*  --  18.4*  --   --   --  15.4*  < > 14.1*  < >  --   --   --  10.3  --   < >  --   --   < >  --  12.2*  --  13.2* 12.1*  PLT  --   < >  --   --  150  --  145*  --   --   --  131*  < > 101*  < >  --   --   --  83*  --   < >  --   --   < >  --  138*  --  180 190  NA 138  --   --   --   --   --  140  --  136  --  136  --  137  --   --   --   --  140  --   --   --  137  --   --  140  --   --  137  K 4.3  --   --   --   --   --  2.8*  --  4.8  --  4.6  --  4.0  --   --   --   --  3.6  --   --   --  2.9*  --   --  4.5  --   --  4.1  CL 106  --   --   --   --   --  96  --  99  --  101  --  101  --   --   --   --  105  --   --   --  106  --   --  105  --   --  103  CO2  --   --   --   --   --   --  31  --  29  --  28  --  25  --   --   --   --  28  --   --   --  17*  --   --  28  --   --  26  GLUCOSE 316*  --   --   --   --   --  296*  --  235*  --  298*  --  209*  --   --   --   --  149*  --   --   --  300*  --   --  138*  --   --  155*  BUN 24*  --   --   --   --   --  35*  --  38*  --  41*  --  44*  --   --   --   --  42*  --   --   --  20  --   --  22  --   --  17   CREATININE 1.40*  --   --   --   --   --  1.90*  --  2.27*  --  2.25*  --  2.17*  --   --   --   --  1.98*  --   --   --  1.27*  --   --  1.09  --   --  0.97  CALCIUM  --   --   --   --   --   --  7.9*  --  7.4*  --  6.8*  --  6.9*  --   --   --   --  6.9*  --   --   --  7.1*  --   --  8.3*  --   --  8.7  MG  --   --   --   --   --   --  1.5  --   --   --  1.8  --   --   --   --   --   --   --   --   --   --   --   --   --   --   --   --   --   PHOS  --   --   --   --   --   --   --   --   --   --  4.4  --   --   --   --   --   --   --   --   --   --   --   --   --   --   --   --   --   AST  --   --   --   --   --   --   --   --   --   --  81*  --   --   --   --   --   --  31  --   --   --  14  --   --   --   --   --   --   ALT  --   --   --   --   --   --   --   --   --   --  148*  --   --   --   --   --   --  87*  --   --   --  12  --   --   --   --   --   --   ALKPHOS  --   --   --   --   --   --   --   --   --   --  44  --   --   --   --   --   --  42  --   --   --  157*  --   --   --   --   --   --   BILITOT  --   --   --   --   --   --   --   --   --   --  0.3  --   --   --   --   --   --  0.5  --   --   --  0.3  --   --   --   --   --   --   PROT  --   --   --   --   --   --   --   --   --   --  3.9*  --   --   --   --   --   --  4.3*  --   --   --  5.1*  --   --   --   --   --   --   ALBUMIN  --   --   --   --   --   --   --   --   --   --  1.8*  --   --   --   --   --   --  2.0*  --   --   --  1.2*  --   --   --   --   --   --   APTT  --   < > 108*  --  49*  --   --   --   --   --   --   --   --   --   --   --   --  23*  --   --   --   --   --   --   --   --   --   --   INR  --   < > >10.00*  --  2.71*  --  1.59*  --   --   --   --   --   --   --   --   --   --  1.10  --   --   --   --   --  1.05  --   --   --   --   LATICACIDVEN  --   < >  --   < >  --   --  6.8*  --  2.4*  --  2.6*  --  2.4*  --   --   --   --  1.0  --   --   --   --   --   --   --   --   --   --   TROPONINI  --   --   --    --   --   --   --   --   --   --  1.57*  < > 1.08*  --   --   --   --  0.83* 0.66*  --   --   --   --   --   --   --   --   --   PROCALCITON  --   --   --   --   --   --   --   < >  --   --  11.02  --  7.93  --   --  5.71  --   --   --   --  1.79  --   --   --   --   --   --   --   PROBNP  --   --   --   --   --   --   --   --   --   --  5711.0*  --   --   --   --   --   --  3809.0*  --   --   --   --   --   --   --  8731.0*  --   --   PHART  --   --   --   --   --   < >  --   --   --  7.398  --   --   --   --  7.443  --  7.441  --   --   --   --   --   --   --   --   --   --   --   PCO2ART  --   --   --   --   --   < >  --   --   --  44.5  --   --   --   --  40.7  --  40.5  --   --   --   --   --   --   --   --   --   --   --   PO2ART  --   --   --   --   --   < >  --   --   --  103.0*  --   --   --   --  83.9  --  83.0  --   --   --   --   --   --   --   --   --   --   --   < > = values in this interval not displayed.  Recent Labs Lab 07/18/12 2154 07/18/12 2223 07/18/12 2338 07/19/12 0758 07/19/12 1132  GLUCAP 67* 74 146* 128* 124*    CXR: NNF  ASSESSMENT / PLAN:  PULMONARY A: Bilateral pulmonary embolism, s/p IVC filter Acute Respiratory failure, resolved Mild stridor - likely post extubation laryngeal edema P:  Cont supplemental O2 BDs schedule Cont to monitor in SDU with likely start heparin  CARDIOVASCULAR A:  Massive PE P:  Cont tele in sdu Will d/w vascular as hgb unchanged without transfusion to start heparin NO bolus Would NOT start coumadin yet as not as easily reversed as heparin for now See heme As was on coumadin prior and this occured, may make a consideration to keep filter with anticoagulation Cbc back to q12h with this start  RENAL A: AKI, resolved Hypokalemia, resolved P:  kvo  GASTROINTESTINAL A: Transaminitis, resolved P:  SUP N/I - D/C'd 7/16 Cont CHO mod diet  HEMATOLOGIC A:  Bilateral Pulmonary Embolism s/p TNK 7/11  RLE DVT, Studies  cannot r/o LLE DVT Acute blood loss anemia s/p 4 U PRBC, 1u FFP and protamine x 1 7/13  Thrombocytopenia, mild R thigh hematoma P:  Monitor coags, 6/17 INR 1.05, Pro Time 13.5, APPT 23 Resume anticoagulation with goal INR 2.0-3.0 after there on heparin and cbc q12h uneventful Would consider leaving IVC filter permanently as she suffered PE while on warfarin, see pulm [place] [address] gene mut, homocystein, factor 5 leiden, unclear work up on last dvt - for family history as needs life long anticoagualtion  INR was up on admission, unclear if she was on coumadin per pt, calling dr Catarina Hartshorn to confirm, if NOT on, will ned work up innate liver fxn (inr 10 also after tnk, rare)  INFECTIOUS A: No evidence of infection  P:  Micro and abx as above  ENDOCRINE A: Hypothyroid  DM  P:  Change synthroid to PO 7/16 Resume glyburide and metformin Pt having hyper/hypoglycemic episodes, continue to monitor. Treat hypo episodes as needed.  Cont ACHS SSI   IF tolerated heparin, picc line preferred by pt, see how does over night  Remain in sdu with start heparin Cbc q12h x 2  Jennifer Little PA-S   I have fully examined this patient and agree with above findings.    And edited in full  Mcarthur Rossetti. Tyson Alias, MD, FACP Pgr: [phone] [facility]

## 2012-07-19 NOTE — Progress Notes (Signed)
Utilization review completed.  

## 2012-07-19 NOTE — Progress Notes (Signed)
ANTICOAGULATION CONSULT NOTE - Follow Up  Pharmacy Consult for Heparin Indication: pulmonary embolus , DVT (s/p TNK)  No Known Allergies  Patient Measurements: Height: 5\' 7"  (170.2 cm) Weight: 231 lb 7.7 oz (105 kg) IBW/kg (Calculated) : 61.6 Heparin Dosing Weight: 71 kg  Vital Signs: Temp: 98.6 F (37 C) (07/17 0801) Temp src: Oral (07/17 0801) BP: 156/82 mmHg (07/17 0801) Pulse Rate: 102 (07/17 0801)  Labs:  Recent Labs  07/17/12 1110  07/17/12 1611 07/18/12 0015 07/18/12 1831 07/19/12 0020  HGB  --   < >  --  7.6* 9.3* 8.8*  HCT  --   < >  --  23.1* 27.9* 26.6*  PLT  --   < >  --  138* 180 190  LABPROT  --   --  13.5  --   --   --   INR  --   --  1.05  --   --   --   CREATININE 1.27*  --   --  1.09  --  0.97  < > = values in this interval not displayed.  Estimated Creatinine Clearance: 66.3 ml/min (by C-G formula based on Cr of 0.97).  Assessment: 72 yo F s/p TNK for PE.  Attempted restart of heparin previously, but SE of bleeding.  Will attempt restart of heparin today without bolus.    Pt previous therapeutic Heparin level on 650Units/hr.  Goal of Therapy:  Heparin level 0.3-0.7 Monitor platelets by anticoagulation protocol: Yes   Plan:  Start heparin 650 units/hr. Heparin level 8 hours after start Heparin level and CBC daily.  Thanks Piedad Climes, PharmD Clinical Pharmacist - Resident Pager: [phone] Pharmacy: [phone] 07/19/2012 1:42 PM

## 2012-07-19 NOTE — Progress Notes (Signed)
Physical Therapy Evaluation Patient Details Name: Hannah [name] MRN: [id number] DOB: Jun 18, 1940 Today's Date: 07/19/2012 Time: 7829-5621 PT Time Calculation (min): 28 min  PT Assessment / Plan / Recommendation History of Present Illness  Pt admit fro PEA arrest, Massive PE, and bil LE DVTs.  IVC filter in place.    Clinical Impression  Pt admitted with PEA arrest, massive PE and DVTs. Pt currently with functional limitations due to the deficits listed below (see PT Problem List). Pt with poor endurance for activity with need for cues for pursed lip breathing as DOE 3/4 with ambulation today as well as need for O2 with ambulation.  Pt will benefit from skilled PT to increase their independence and safety with mobility to allow discharge to the venue listed below. Has supportive family.    PT Assessment  Patient needs continued PT services    Follow Up Recommendations  Home health PT;Supervision/Assistance - 24 hour                Equipment Recommendations  Rolling walker with 5" wheels         Frequency Min 3X/week    Precautions / Restrictions Precautions Precautions: Fall Restrictions Weight Bearing Restrictions: No   Pertinent Vitals/Pain VSS with sats >90% except for a brief time that sats did not pick up when pt ambulating.  Pt did have O2 on with ambulation.  No pain reported during session.      Mobility  Bed Mobility Bed Mobility: Not assessed Details for Bed Mobility Assistance: Pt up in chair. Transfers Transfers: Sit to Stand;Stand to Sit Sit to Stand: 1: +2 Total assist;From chair/3-in-1;With armrests;With upper extremity assist Sit to Stand: Patient Percentage: 60% Stand to Sit: 3: Mod assist;To chair/3-in-1;With upper extremity assist;With armrests Details for Transfer Assistance: Assist for power up from chair and to control descent. VCs for safe hand placement. Ambulation/Gait Ambulation/Gait Assistance: 4: Min assist Ambulation Distance (Feet): 15  Feet Assistive device: Rolling walker Ambulation/Gait Assistance Details: Pt did well with ambulation overall.  Initially pt with some knee instability and therefore performed close chair follow.  Once, pt got going, pt ambulated with just steadying assist and cues.  Occasional assist to steer RW as well.   Gait Pattern: Decreased stride length;Step-through pattern;Trunk flexed Gait velocity: decreased- very slow gait Stairs: No Wheelchair Mobility Wheelchair Mobility: No         PT Diagnosis: Generalized weakness  PT Problem List: Decreased activity tolerance;Decreased balance;Decreased mobility;Decreased knowledge of use of DME;Decreased safety awareness;Decreased knowledge of precautions PT Treatment Interventions: DME instruction;Gait training;Functional mobility training;Stair training;Therapeutic exercise;Therapeutic activities;Balance training;Patient/family education     PT Goals(Current goals can be found in the care plan section) Acute Rehab PT Goals Patient Stated Goal: to return home with family PT Goal Formulation: With patient Time For Goal Achievement: 07/26/12 Potential to Achieve Goals: Good  Visit Information  Last PT Received On: 07/19/12 Assistance Needed: +2 PT/OT Co-Evaluation/Treatment: Yes History of Present Illness: Pt admit fro PEA arrest, Massive PE, and bil LE DVTs.  IVC filter in place.         Prior Functioning  Home Living Family/patient expects to be discharged to:: Private residence Living Arrangements: Alone Available Help at Discharge: Family;Available 24 hours/day Type of Home: House Home Access: Stairs to enter [employer] of Steps: 6 Entrance Stairs-Rails: Can reach both;Right;Left Home Layout: One level Home Equipment: Tub bench Additional Comments: To stay with daughter and have 24 hour care at d/c.   Prior Function Level of  Independence: Independent Communication Communication: No difficulties;Other (comment) (very SOB  and had to speak slowly due to this.  )    Cognition  Cognition Arousal/Alertness: Awake/alert Behavior During Therapy: WFL for tasks assessed/performed Overall Cognitive Status: Within Functional Limits for tasks assessed    Extremity/Trunk Assessment Upper Extremity Assessment Upper Extremity Assessment: Overall WFL for tasks assessed Lower Extremity Assessment Lower Extremity Assessment: Overall WFL for tasks assessed Cervical / Trunk Assessment Cervical / Trunk Assessment: Normal   Balance Balance Balance Assessed: Yes Static Standing Balance Static Standing - Balance Support: Bilateral upper extremity supported;During functional activity Static Standing - Level of Assistance: 4: Min assist Static Standing - Comment/# of Minutes: 3  End of Session PT - End of Session Equipment Utilized During Treatment: Gait belt;Oxygen Activity Tolerance: Patient limited by fatigue Patient left: in chair;with call bell/phone within reach;with nursing/sitter in room;with family/visitor present Nurse Communication: Mobility status       INGOLD,Seini Lannom 07/19/2012, 1:38 PM [facility] [phone] (pager)

## 2012-07-20 LAB — HEPARIN LEVEL (UNFRACTIONATED)
Heparin Unfractionated: <0.1 IU/mL — ABNORMAL LOW (ref 0.30–0.70)
Heparin Unfractionated: <0.1 IU/mL — ABNORMAL LOW (ref 0.30–0.70)

## 2012-07-20 LAB — GLUCOSE, CAPILLARY: Glucose-Capillary: 151 mg/dL — ABNORMAL HIGH (ref 70–99)

## 2012-07-20 LAB — CBC WITH DIFFERENTIAL/PLATELET
Basophils Absolute: 0.1 10*3/uL (ref 0.0–0.1)
Eosinophils Absolute: 0.2 10*3/uL (ref 0.0–0.7)
Lymphocytes Relative: 23 % (ref 12–46)
MCH: 29.2 pg (ref 26.0–34.0)
MCHC: 32.7 g/dL (ref 30.0–36.0)
Monocytes Absolute: 1.1 10*3/uL — ABNORMAL HIGH (ref 0.1–1.0)
Neutrophils Relative %: 64 % (ref 43–77)
Platelets: 270 10*3/uL (ref 150–400)
RDW: 14.2 % (ref 11.5–15.5)

## 2012-07-20 LAB — ANA: Anti Nuclear Antibody(ANA): NEGATIVE

## 2012-07-20 MED ORDER — WARFARIN SODIUM 5 MG PO TABS
5.0000 mg | ORAL_TABLET | Freq: Once | ORAL | Status: AC
  Administered 2012-07-20: 5 mg via ORAL
  Filled 2012-07-20: qty 1

## 2012-07-20 MED ORDER — WARFARIN - PHARMACIST DOSING INPATIENT: Freq: Every day | Status: DC

## 2012-07-20 NOTE — Progress Notes (Signed)
ANTICOAGULATION CONSULT NOTE - Follow Up  Pharmacy Consult for Heparin Indication: pulmonary embolus , DVT (s/p TNK)  No Known Allergies  Patient Measurements: Height: 5\' 7"  (170.2 cm) Weight: 231 lb 7.7 oz (105 kg) IBW/kg (Calculated) : 61.6 Heparin Dosing Weight: 71 kg  Vital Signs: Temp: 98.9 F (37.2 C) (07/18 1100) Temp src: Oral (07/18 1100) BP: 150/70 mmHg (07/18 0727) Pulse Rate: 115 (07/18 0727)  Labs:  Recent Labs  07/17/12 1611 07/18/12 0015  07/19/12 0020 07/19/12 1311 07/19/12 2343 07/20/12 0550 07/20/12 1200  HGB  --  7.6*  < > 8.8* 9.3*  --  9.1*  --   HCT  --  23.1*  < > 26.6* 27.4*  --  27.8*  --   PLT  --  138*  < > 190 222  --  270  --   LABPROT 13.5  --   --   --   --   --   --   --   INR 1.05  --   --   --   --   --   --   --   HEPARINUNFRC  --   --   --   --   --  <0.10*  --  <0.10*  CREATININE  --  1.09  --  0.97  --   --   --   --   < > = values in this interval not displayed.  Estimated Creatinine Clearance: 66.3 ml/min (by C-G formula based on Cr of 0.97).  Assessment: 72 yo F s/p TNK for PE. On IV heparin, anti-Xa level remains subtherapeutic on 800 units/hr, no interruption or issues with infusion per RN. Will also resume coumadin today, home dose unknown, INR 1.05 on 7/15. hgb 9.1, plt 270. Stable since transfusion.   Goal of Therapy:  INR 2-3 Heparin level 0.3-0.7 Monitor platelets by anticoagulation protocol: Yes   Plan:  Start heparin 950 units/hr. Heparin level 8 hours after start Heparin level and CBC daily. Coumadin 5mg  po x1  Thanks Bayard Hugger, PharmD, BCPS  Clinical Pharmacist  Pager: [phone]   07/20/2012 1:05 PM

## 2012-07-20 NOTE — Progress Notes (Signed)
ANTICOAGULATION CONSULT NOTE - Follow Up  Pharmacy Consult for Heparin Indication: pulmonary embolus , DVT (s/p TNK)  No Known Allergies  Patient Measurements: Height: 5\' 7"  (170.2 cm) Weight: 231 lb 7.7 oz (105 kg) IBW/kg (Calculated) : 61.6 Heparin Dosing Weight: 71 kg  Vital Signs: Temp: 99.6 F (37.6 C) (07/18 2000) Temp src: Oral (07/18 2000) BP: 150/78 mmHg (07/18 2241) Pulse Rate: 103 (07/18 2241)  Labs:  Recent Labs  07/18/12 0015  07/19/12 0020 07/19/12 1311 07/19/12 2343 07/20/12 0550 07/20/12 1200 07/20/12 2120  HGB 7.6*  < > 8.8* 9.3*  --  9.1*  --   --   HCT 23.1*  < > 26.6* 27.4*  --  27.8*  --   --   PLT 138*  < > 190 222  --  270  --   --   HEPARINUNFRC  --   --   --   --  <0.10*  --  <0.10* <0.10*  CREATININE 1.09  --  0.97  --   --   --   --   --   < > = values in this interval not displayed.  Estimated Creatinine Clearance: 66.3 ml/min (by C-G formula based on Cr of 0.97).  Assessment: 72 yo F s/p TNK for PE (7/11). On IV heparin and coumadin resumed today.  Anti-Xa level remains subtherapeutic despite increase to 950 units/hr earlier today, no interruption or issues with infusion per RN.   Goal of Therapy:  INR 2-3 Heparin level 0.3-0.7 Monitor platelets by anticoagulation protocol: Yes   Plan:  - Increase heparin to 1200 units/hr - Check 8h heparin level - Heparin level and CBC daily  Lilinoe Acklin L. Illene Bolus, PharmD, BCPS Clinical Pharmacist Pager: [phone] Pharmacy: [phone] 07/20/2012 10:44 PM '

## 2012-07-20 NOTE — Progress Notes (Signed)
Physical Therapy Treatment Patient Details Name: Hannah [name] MRN: [id number] DOB: 01/13/1940 Today's Date: 07/20/2012 Time: 2725-3664 PT Time Calculation (min): 29 min  PT Assessment / Plan / Recommendation  PT Comments   Pt admitted with PEA arrest, massive PE and DVTs with IVC filter in place.  Progressing with mobility increasing ambulation distance. Pt currently with functional limitations due to poor endurance and decr safety awareness with RW as well as weakness.  Pt will benefit from skilled PT to increase their independence and safety with mobility to allow discharge to the venue listed below.   Follow Up Recommendations  Home health PT;Supervision/Assistance - 24 hour                 Equipment Recommendations  Rolling walker with 5" wheels        Frequency Min 3X/week   Progress towards PT Goals Progress towards PT goals: Progressing toward goals  Plan Current plan remains appropriate    Precautions / Restrictions Precautions Precautions: Fall Restrictions Weight Bearing Restrictions: No   Pertinent Vitals/Pain HR 113-130 bpm, O2 93% and > on 6LO2 with ambulation.  No pain    Mobility  Bed Mobility Bed Mobility: Not assessed Details for Bed Mobility Assistance: Pt up in chair. Transfers Transfers: Sit to Stand;Stand to Sit Sit to Stand: 1: +2 Total assist;From chair/3-in-1;With armrests;With upper extremity assist Sit to Stand: Patient Percentage: 70% Stand to Sit: 3: Mod assist;To chair/3-in-1;With upper extremity assist;With armrests Details for Transfer Assistance: Assist for power up from chair and to control descent. VCs for safe hand placement. Ambulation/Gait Ambulation/Gait Assistance: 4: Min assist Ambulation Distance (Feet): 85 Feet Assistive device: Rolling walker Ambulation/Gait Assistance Details: Pt continues to do well with ambulation overall.  Pt needed steadying assist initially upon standing.  Performed close chair follow.  Pt needed  steadying asssit and cues while ambulating with cues to stay close to RW and occasional assist for steering RW.   Gait Pattern: Decreased stride length;Step-through pattern;Trunk flexed Gait velocity: decreased- very slow gait Stairs: No Wheelchair Mobility Wheelchair Mobility: No    Exercises General Exercises - Lower Extremity Ankle Circles/Pumps: AROM;Both;10 reps;Supine Quad Sets: AROM;Both;10 reps;Supine Gluteal Sets: AROM;Both;10 reps;Supine Long Arc Quad: Both;10 reps;AAROM;Seated Heel Slides: AAROM;Both;10 reps;Seated    PT Goals (current goals can now be found in the care plan section)    Visit Information  Last PT Received On: 07/20/12 Assistance Needed: +2 History of Present Illness: Pt admit fro PEA arrest, Massive PE, and bil LE DVTs.  IVC filter in place.      Subjective Data  Subjective: "I feel so weak and I am tired of getting stuck."   Cognition  Cognition Arousal/Alertness: Awake/alert Behavior During Therapy: WFL for tasks assessed/performed Overall Cognitive Status: Within Functional Limits for tasks assessed    Balance  Static Standing Balance Static Standing - Balance Support: Bilateral upper extremity supported;During functional activity Static Standing - Level of Assistance: 4: Min assist Static Standing - Comment/# of Minutes: 3  End of Session PT - End of Session Equipment Utilized During Treatment: Gait belt;Oxygen Activity Tolerance: Patient limited by fatigue Patient left: in chair;with call bell/phone within reach;with nursing/sitter in room;with family/visitor present Nurse Communication: Mobility status        INGOLD,Suha Schoenbeck 07/20/2012, 9:34 AM [facility] [phone] (pager)

## 2012-07-20 NOTE — Progress Notes (Addendum)
PULMONARY  / CRITICAL CARE MEDICINE  Name: Hannah [name] MRN: [id number] DOB: 1940-02-14    ADMISSION DATE:  07/13/2012 CONSULTATION DATE:  07/13/2012  REFERRING MD :  EDP - [place] PRIMARY SERVICE: PCCM  CHIEF COMPLAINT:  SOB, diaphoresis  BRIEF PATIENT DESCRIPTION: [name] is a 72 yo female with a PMH of HTN, HL, DM who presented to [facility] on 7/11with  pea arrest ,massive PE.= s/p TNK  SIGNIFICANT EVENTS / STUDIES:  7/11 >>> Admitted with PE, PEA arrest, given TNK 7/11 >>> CT of Chest: central and obstructive bilateral pulmonary embolism with right heart strain  7/12 Echo >>> EF 60%, RV mod dilat, decreased systolic fxn, RA mild dilation , PAP -45  7/13 >>> Sig drop in Hbg on Hep , no obvious signs of bleeding /oozing from IV sites, rectal, oral, urine. Given 4u PRBC, protamine, and 1u FFP 7/13 CT Abd Pelvis >>> Intramuscular R thigh hematoma present 7/14 >>> IVC filter placement 7/14 extubated 7/15- nonocclusive rt leg dvt common fem, fem ,popliteal. Cannot exclude dvt involving left femoral vein   LINES / TUBES: 7/11 ETT >> 7/14 7/11 R Fem CVC >> d/c'd 7/13 L ALine >> d/c'd 7/12 Foley >>> d/c'd  CULTURES: 7/11 UC >> no growth  ANTIBIOTICS: None  SUBJECTIVE:  No SOB, oob to chair  VITAL SIGNS: Temp:  [98.2 F (36.8 C)-100.1 F (37.8 C)] 99.1 F (37.3 C) (07/18 0727) Pulse Rate:  [97-116] 115 (07/18 0727) Resp:  [19-26] 26 (07/18 0727) BP: (150-162)/(70-94) 150/70 mmHg (07/18 0727) SpO2:  [100 %] 100 % (07/18 0901) HEMODYNAMICS:   VENTILATOR SETTINGS:   INTAKE / OUTPUT: Intake/Output     07/17 0701 - 07/18 0700 07/18 0701 - 07/19 0700   P.O. 720 120   I.V. (mL/kg) 213.6 (2) 16 (0.2)   IV Piggyback     Total Intake(mL/kg) 933.6 (8.9) 136 (1.3)   Urine (mL/kg/hr) 2000 (0.8)    Total Output 2000     Net -1066.4 +136        Urine Occurrence 2 x      PHYSICAL EXAMINATION: General:  NAD, coughing often, voice horse Neuro: No focal  deficits HEENT: mild stridor Cardiovascular: RRR s M Lungs: no stridor , decreased rt base Abdomen:  Soft, nontender to palpation, +BS Musculoskeletal:  Extremities atraumatic, RLE swelling > LLE, upper rt unchanged Skin:  No rashes noted  LABS:  Recent Labs Lab 07/13/12 1124  07/13/12 1700  07/13/12 2200  07/14/12 0500  07/14/12 1900 07/15/12 0307 07/15/12 0500  07/15/12 1600  07/16/12 0330 07/16/12 0336 07/16/12 0340 07/16/12 0345 07/16/12 0727  07/17/12 0740 07/17/12 1110  07/17/12 1611 07/18/12 0015 07/18/12 0032  07/19/12 0020 07/19/12 1311 07/20/12 0550  HGB 12.6  < >  --   --  9.4*  --  8.5*  --   --   --  4.8*  < > 8.0*  < >  --   --   --  7.3*  --   < >  --   --   < >  --  7.6*  --   < > 8.8* 9.3* 9.1*  WBC  --   < >  --   --  18.5*  --  18.4*  --   --   --  15.4*  < > 14.1*  < >  --   --   --  10.3  --   < >  --   --   < >  --  12.2*  --   < > 12.1* 11.0* 11.1*  PLT  --   < >  --   --  150  --  145*  --   --   --  131*  < > 101*  < >  --   --   --  83*  --   < >  --   --   < >  --  138*  --   < > 190 222 270  NA 138  --   --   --   --   --  140  --  136  --  136  --  137  --   --   --   --  140  --   --   --  137  --   --  140  --   --  137  --   --   K 4.3  --   --   --   --   --  2.8*  --  4.8  --  4.6  --  4.0  --   --   --   --  3.6  --   --   --  2.9*  --   --  4.5  --   --  4.1  --   --   CL 106  --   --   --   --   --  96  --  99  --  101  --  101  --   --   --   --  105  --   --   --  106  --   --  105  --   --  103  --   --   CO2  --   --   --   --   --   --  31  --  29  --  28  --  25  --   --   --   --  28  --   --   --  17*  --   --  28  --   --  26  --   --   GLUCOSE 316*  --   --   --   --   --  296*  --  235*  --  298*  --  209*  --   --   --   --  149*  --   --   --  300*  --   --  138*  --   --  155*  --   --   BUN 24*  --   --   --   --   --  35*  --  38*  --  41*  --  44*  --   --   --   --  42*  --   --   --  20  --   --  22  --   --  17  --   --    CREATININE 1.40*  --   --   --   --   --  1.90*  --  2.27*  --  2.25*  --  2.17*  --   --   --   --  1.98*  --   --   --  1.27*  --   --  1.09  --   --  0.97  --   --  CALCIUM  --   --   --   --   --   --  7.9*  --  7.4*  --  6.8*  --  6.9*  --   --   --   --  6.9*  --   --   --  7.1*  --   --  8.3*  --   --  8.7  --   --   MG  --   --   --   --   --   --  1.5  --   --   --  1.8  --   --   --   --   --   --   --   --   --   --   --   --   --   --   --   --   --   --   --   PHOS  --   --   --   --   --   --   --   --   --   --  4.4  --   --   --   --   --   --   --   --   --   --   --   --   --   --   --   --   --   --   --   AST  --   --   --   --   --   --   --   --   --   --  33*  --   --   --   --   --   --  31  --   --   --  14  --   --   --   --   --   --   --   --   ALT  --   --   --   --   --   --   --   --   --   --  148*  --   --   --   --   --   --  87*  --   --   --  12  --   --   --   --   --   --   --   --   ALKPHOS  --   --   --   --   --   --   --   --   --   --  44  --   --   --   --   --   --  42  --   --   --  157*  --   --   --   --   --   --   --   --   BILITOT  --   --   --   --   --   --   --   --   --   --  0.3  --   --   --   --   --   --  0.5  --   --   --  0.3  --   --   --   --   --   --   --   --   PROT  --   --   --   --   --   --   --   --   --   --  3.9*  --   --   --   --   --   --  4.3*  --   --   --  5.1*  --   --   --   --   --   --   --   --   ALBUMIN  --   --   --   --   --   --   --   --   --   --  1.8*  --   --   --   --   --   --  2.0*  --   --   --  1.2*  --   --   --   --   --   --   --   --   APTT  --   < > 108*  --  49*  --   --   --   --   --   --   --   --   --   --   --   --  23*  --   --   --   --   --   --   --   --   --   --   --   --   INR  --   < > >10.00*  --  2.71*  --  1.59*  --   --   --   --   --   --   --   --   --   --  1.10  --   --   --   --   --  1.05  --   --   --   --   --   --   LATICACIDVEN  --   < >  --   < >  --   --  6.8*  --  2.4*   --  2.6*  --  2.4*  --   --   --   --  1.0  --   --   --   --   --   --   --   --   --   --   --   --   TROPONINI  --   --   --   --   --   --   --   --   --   --  1.57*  < > 1.08*  --   --   --   --  0.83* 0.66*  --   --   --   --   --   --   --   --   --   --   --   PROCALCITON  --   --   --   --   --   --   --   < >  --   --  11.02  --  7.93  --   --  5.71  --   --   --   --  1.79  --   --   --   --   --   --   --   --   --   PROBNP  --   --   --   --   --   --   --   --   --   --  5711.0*  --   --   --   --   --   --  3809.0*  --   --   --   --   --   --   --  8731.0*  --   --   --   --   PHART  --   --   --   --   --   < >  --   --   --  7.398  --   --   --   --  7.443  --  7.441  --   --   --   --   --   --   --   --   --   --   --   --   --   PCO2ART  --   --   --   --   --   < >  --   --   --  44.5  --   --   --   --  40.7  --  40.5  --   --   --   --   --   --   --   --   --   --   --   --   --   PO2ART  --   --   --   --   --   < >  --   --   --  103.0*  --   --   --   --  83.9  --  83.0  --   --   --   --   --   --   --   --   --   --   --   --   --   < > = values in this interval not displayed.  Recent Labs Lab 07/19/12 0758 07/19/12 1132 07/19/12 1555 07/19/12 2139 07/20/12 0725  GLUCAP 128* 124* 116* 151* 132*     ASSESSMENT / PLAN:  PULMONARY A: Bilateral pulmonary embolism, s/p IVC filter Acute Respiratory failure, resolved Mild stridor - likely post extubation laryngeal edema, resolved P:  Cont supplemental O2 BDs schedule Cont to monitor in SDU with likely start heparin  CARDIOVASCULAR A:  Massive PE P:  See heme  RENAL A: AKI, resolved Hypokalemia, resolved P:  kvo  GASTROINTESTINAL A: Transaminitis, resolved P:  Cont CHO mod diet  HEMATOLOGIC A:  Bilateral Pulmonary Embolism s/p TNK 7/11  RLE DVT, Studies cannot r/o LLE DVT Acute blood loss anemia s/p 4 U PRBC, 1u FFP and protamine x 1 7/13  Thrombocytopenia, mild R thigh hematoma P:  Resume  anticoagulation with goal INR 2.0-3.0 after there on heparin and cbc daily Can change heparin to lovenox if tolerated well with no bleed  Would consider leaving IVC filter permanently as she suffered PE while on warfarin, see pulm Age appropriate cancer screening  Pt 20[address] gene mut, homocystein, factor 5 leiden, unclear work up on last dvt - for family history as needs life long anticoagualtion INR was up on admission, unclear if she was on coumadin per pt, calling dr Algie Coffer to confirm, if NOT on, will need work up innate liver fxn (inr 10 also after tnk, rare)  INFECTIOUS A: No evidence of infection  P:  Micro and abx as above  ENDOCRINE A: Hypothyroid  DM  P:  Change synthroid to PO 7/16 Resume glyburide and metformin Pt having hyper/hypoglycemic episodes, continue to monitor. Treat hypo episodes as needed.  Cont ACHS SSI   Cyril Mourning MD. Tonny Bollman. [place]  Pulmonary & Critical care Pager [phone] If no response call [phone]

## 2012-07-20 NOTE — Progress Notes (Signed)
HOME HEALTH AGENCIES SERVING [place]   Agencies that are Medicare-Certified and are affiliated with [facility]  Telephone Number Address  [facility]   [facility] [facility] has ownership interest in this company; however, you are under no obligation to use this agency. [phone] or  [phone] [address] [place], [place] [address] [email or web address]   Agencies that are Medicare-Certified and are not affiliated with [facility]                                                                                 Home Health Agency Telephone Number Address  [facility] [phone] Fax [phone] [address], [address] [place], [place]  [address] [email or web address]  [facility] [phone] or [phone] Fax [phone] [address] [place], [place] [address] [email or web address]  [facility] [phone] Fax [phone] [address] [place], [place] [address] [email or web address]  [facility] [phone] Fax [phone] [address] [place], [place]  [address] [email or web address]  [facility] [phone] Fax [phone] [address], [address] [place], [place] [address] [email or web address]  [facility] [phone] [address] [place], [place] [address] [email or web address]  [facility] [phone]  [address] [place], [place] [address] [email or web address]  [facility] [phone] or [phone] Fax number [phone] [address] [place], [place]  [address] [email or web address]  [facility] [phone] Fax [phone] [address] [place], [place]  [address]  [facility]  [phone] Fax [phone] [address] [place], [place] [address] [email or web address]       Agencies that are not Medicare-Certified and are not affiliated with [facility] [place] [facility]   Home Health Agency Telephone Number Address  [facility] [phone] or [phone] Fax [phone] [address] [place], [place]  [address] [email or web address]  [facility] [phone] Fax [phone] [address] [place], [place]  [address] [email or web address]  [facility] [phone] Fax [phone] [address] [place], [place]  [address] [email or web address]  [facility]  [phone] Fax [phone] [address] [place], [place] [address] [email or web address]  [facility] [phone] Fax [phone] [address] [place], [place] [address]  [facility] [phone] or [phone] Fax [phone] [address] [place], [place]  [address] [email or web address]  [facility] [phone] or [phone] Fax [phone] [address] [place], [place]  [address] [email or web address]  [facility] [phone] Fax [phone] [address] [place], [place]  [address] [email or web address]  [facility] [phone] [address] [place], [place]  [address]  [facility] [phone] Fax [phone] [address] [place], [place]  [address]  [facility] [phone] Fax [phone] [address] [place], [place]  [address] [email or web address]  [facility] [phone] Fax [phone] [address] [place],  [address] [email or web address]  [facility] [phone] Fax [phone] [address] [place],     [address] [email or web address]   

## 2012-07-20 NOTE — Progress Notes (Signed)
ANTICOAGULATION CONSULT NOTE - Follow Up Consult  Pharmacy Consult for heparin Indication: pulmonary embolus and DVT (s/p TNK)  Labs:  Recent Labs  07/17/12 1110  07/17/12 1611 07/18/12 0015 07/18/12 1831 07/19/12 0020 07/19/12 1311 07/19/12 2343  HGB  --   < >  --  7.6* 9.3* 8.8* 9.3*  --   HCT  --   < >  --  23.1* 27.9* 26.6* 27.4*  --   PLT  --   < >  --  138* 180 190 222  --   LABPROT  --   --  13.5  --   --   --   --   --   INR  --   --  1.05  --   --   --   --   --   HEPARINUNFRC  --   --   --   --   --   --   --  <0.10*  CREATININE 1.27*  --   --  1.09  --  0.97  --   --   < > = values in this interval not displayed.   Assessment: 72yo female undetectable on heparin after resumed at rate that was previously therapeutic.  Goal of Therapy:  Heparin level 0.3-0.7 units/ml   Plan:  Will increase heparin gtt conservatively to 800 units/hr and check level in 6hr.  Vernard Gambles, PharmD, BCPS  07/20/2012,12:57 AM

## 2012-07-21 DIAGNOSIS — R092 Respiratory arrest: Secondary | ICD-10-CM

## 2012-07-21 LAB — PROTIME-INR
INR: 1.03 (ref 0.00–1.49)
Prothrombin Time: 13.3 seconds (ref 11.6–15.2)

## 2012-07-21 LAB — HEPARIN LEVEL (UNFRACTIONATED)
Heparin Unfractionated: 0.15 IU/mL — ABNORMAL LOW (ref 0.30–0.70)
Heparin Unfractionated: 1.86 IU/mL — ABNORMAL HIGH (ref 0.30–0.70)

## 2012-07-21 LAB — CBC
Hemoglobin: 9.1 g/dL — ABNORMAL LOW (ref 12.0–15.0)
MCHC: 32.7 g/dL (ref 30.0–36.0)
WBC: 13.7 10*3/uL — ABNORMAL HIGH (ref 4.0–10.5)

## 2012-07-21 LAB — GLUCOSE, CAPILLARY
Glucose-Capillary: 72 mg/dL (ref 70–99)
Glucose-Capillary: 110 mg/dL — ABNORMAL HIGH (ref 70–99)

## 2012-07-21 MED ORDER — HEPARIN (PORCINE) IN NACL 100-0.45 UNIT/ML-% IJ SOLN
1200.0000 [IU]/h | INTRAMUSCULAR | Status: DC
  Filled 2012-07-21 (×2): qty 250

## 2012-07-21 MED ORDER — WARFARIN SODIUM 5 MG PO TABS
5.0000 mg | ORAL_TABLET | Freq: Once | ORAL | Status: AC
  Administered 2012-07-21: 5 mg via ORAL
  Filled 2012-07-21: qty 1

## 2012-07-21 MED ORDER — MAGIC MOUTHWASH
5.0000 mL | Freq: Four times a day (QID) | ORAL | Status: DC
  Administered 2012-07-21 – 2012-07-26 (×17): 5 mL via ORAL
  Filled 2012-07-21 (×24): qty 5

## 2012-07-21 NOTE — Progress Notes (Signed)
ANTICOAGULATION CONSULT NOTE - Follow Up Consult  Pharmacy Consult for heparin Indication: pulmonary embolus and DVT  Labs:  Recent Labs  07/19/12 0020 07/19/12 1311  07/20/12 0550  07/21/12 0700 07/21/12 0805 07/21/12 1947 07/21/12 2122  HGB 8.8* 9.3*  --  9.1*  --   --  9.1*  --   --   HCT 26.6* 27.4*  --  27.8*  --   --  27.8*  --   --   PLT 190 222  --  270  --   --  333  --   --   LABPROT  --   --   --   --   --   --  13.3  --   --   INR  --   --   --   --   --   --  1.03  --   --   HEPARINUNFRC  --   --   < >  --   < > 0.15*  --  1.07* 1.86*  CREATININE 0.97  --   --   --   --   --   --   --   --   < > = values in this interval not displayed.   Assessment: 72yo female supratherapeutic on heparin after rate increase for low level; first level drawn from same arm though far distal from IV site, redraw from opposite arm even higher, apparently accumulating.  Goal of Therapy:  Heparin level 0.3-0.7 units/ml   Plan:  Will hold heparin gtt x2min then decrease rate to 1200 units/hr and check level in 6-8hr.  Vernard Gambles, PharmD, BCPS  07/21/2012,10:24 PM

## 2012-07-21 NOTE — Progress Notes (Signed)
ANTICOAGULATION CONSULT NOTE - Follow Up  Pharmacy Consult for Heparin Indication: pulmonary embolus , DVT (s/p TNK)  No Known Allergies  Patient Measurements: Height: 5\' 7"  (170.2 cm) Weight: 219 lb 2.2 oz (99.4 kg) IBW/kg (Calculated) : 61.6 Heparin Dosing Weight: 71 kg  Vital Signs: Temp: 98.8 F (37.1 C) (07/19 0744) Temp src: Oral (07/19 0744) BP: 154/71 mmHg (07/19 0720) Pulse Rate: 99 (07/19 0720)  Labs:  Recent Labs  07/19/12 0020 07/19/12 1311  07/20/12 0550 07/20/12 1200 07/20/12 2120 07/21/12 0700 07/21/12 0805  HGB 8.8* 9.3*  --  9.1*  --   --   --  9.1*  HCT 26.6* 27.4*  --  27.8*  --   --   --  27.8*  PLT 190 222  --  270  --   --   --  333  LABPROT  --   --   --   --   --   --   --  13.3  INR  --   --   --   --   --   --   --  1.03  HEPARINUNFRC  --   --   < >  --  <0.10* <0.10* 0.15*  --   CREATININE 0.97  --   --   --   --   --   --   --   < > = values in this interval not displayed.  Estimated Creatinine Clearance: 64.4 ml/min (by C-G formula based on Cr of 0.97).  Assessment: 72 yo F s/p TNK for PE (7/11). On IV heparin and coumadin resumed today.  Anti-Xa level remains subtherapeutic despite increase to 1200 units/hr yesterday.  Heparin level 0.15, INR 1.03 today. Pt has no recollection of taking warfarin/coumadin, but able to recall most other home medications.   Goal of Therapy:  INR 2-3 Heparin level 0.3-0.7 Monitor platelets by anticoagulation protocol: Yes   Plan:  - Increase heparin to 1500 units/hr - Check 8h heparin level - Continue Warfarin 5mg  X1 - Heparin level, INR, and CBC daily  Thank you, Piedad Climes, PharmD Clinical Pharmacist - Resident Pager: [phone] Pharmacy: [phone] 07/21/2012 9:56 AM

## 2012-07-21 NOTE — Progress Notes (Signed)
PULMONARY  / CRITICAL CARE MEDICINE  Name: Hannah [name] MRN: [id number] DOB: [birth year]    ADMISSION DATE:  07/13/2012 CONSULTATION DATE:  07/13/2012  REFERRING MD :  EDP - Nanavati PRIMARY SERVICE: PCCM  CHIEF COMPLAINT:  SOB, diaphoresis  BRIEF PATIENT DESCRIPTION: [name] is a 72 yo female with a PMH of HTN, HL, DM who presented to [facility] on 7/11 with a pea arrest ,massive PE.= s/p TNK  SIGNIFICANT EVENTS / STUDIES:  7/11 >>> Admitted with PE, PEA arrest, given TNK 7/11 >>> CT of Chest: central and obstructive bilateral pulmonary embolism with right heart strain  7/12 Echo >>> EF 60%, RV mod dilat, decreased systolic fxn, RA mild dilation , PAP -45  7/13 >>> Sig drop in Hbg on Hep , no obvious signs of bleeding /oozing from IV sites, rectal, oral, urine. Given 4u PRBC, protamine, and 1u FFP 7/13 CT Abd Pelvis >>> Intramuscular R thigh hematoma present 7/14 >>> IVC filter placement 7/14 extubated 7/15- nonocclusive rt leg dvt common fem, fem ,popliteal. Cannot exclude dvt involving left femoral vein   LINES / TUBES: 7/11 ETT >> 7/14 7/11 R Fem CVC >> d/c'd 7/13 L ALine >> d/c'd 7/12 Foley >>> d/c'd  CULTURES: 7/11 UC >> no growth  ANTIBIOTICS: None  SUBJECTIVE:  No SOB, oob to chair  VITAL SIGNS: Temp:  [98.3 F (36.8 C)-99.6 F (37.6 C)] 98.8 F (37.1 C) (07/19 0744) Pulse Rate:  [99-104] 99 (07/19 0720) Resp:  [18-25] 20 (07/19 0720) BP: (147-166)/(70-88) 154/71 mmHg (07/19 0720) SpO2:  [100 %] 100 % (07/19 0720) Weight:  [99.4 kg (219 lb 2.2 oz)] 99.4 kg (219 lb 2.2 oz) (07/19 0500) HEMODYNAMICS:   VENTILATOR SETTINGS:   INTAKE / OUTPUT: Intake/Output     07/18 0701 - 07/19 0700 07/19 0701 - 07/20 0700   P.O. 840 120   I.V. (mL/kg) 424.6 (4.3) 69 (0.7)   Total Intake(mL/kg) 1264.6 (12.7) 189 (1.9)   Urine (mL/kg/hr) 1225 (0.5)    Total Output 1225     Net +39.6 +189        Urine Occurrence 1 x 1 x   Stool Occurrence 1 x      PHYSICAL  EXAMINATION: General:  NAD, coughing often, voice horse Neuro: No focal deficits HEENT: mild stridor Cardiovascular: RRR s M Lungs: no stridor , decreased rt base Abdomen:  Soft, nontender to palpation, +BS Musculoskeletal:  Extremities atraumatic, RLE swelling > LLE, upper rt unchanged Skin:  No rashes noted  LABS:  Recent Labs Lab 07/14/12 1900 07/15/12 0307 07/15/12 0500  07/15/12 1600  07/16/12 0330 07/16/12 0336 07/16/12 0340 07/16/12 0345 07/16/12 0727  07/17/12 0740 07/17/12 1110  07/17/12 1611 07/18/12 0015 07/18/12 0032  07/19/12 0020 07/19/12 1311 07/20/12 0550 07/21/12 0805  HGB  --   --  4.8*  < > 8.0*  < >  --   --   --  7.3*  --   < >  --   --   < >  --  7.6*  --   < > 8.8* 9.3* 9.1* 9.1*  WBC  --   --  15.4*  < > 14.1*  < >  --   --   --  10.3  --   < >  --   --   < >  --  12.2*  --   < > 12.1* 11.0* 11.1* 13.7*  PLT  --   --  131*  < > 101*  < >  --   --   --  83*  --   < >  --   --   < >  --  138*  --   < > 190 222 270 333  NA 136  --  136  --  137  --   --   --   --  140  --   --   --  137  --   --  140  --   --  137  --   --   --   K 4.8  --  4.6  --  4.0  --   --   --   --  3.6  --   --   --  2.9*  --   --  4.5  --   --  4.1  --   --   --   CL 99  --  101  --  101  --   --   --   --  105  --   --   --  106  --   --  105  --   --  103  --   --   --   CO2 29  --  28  --  25  --   --   --   --  28  --   --   --  17*  --   --  28  --   --  26  --   --   --   GLUCOSE 235*  --  298*  --  209*  --   --   --   --  149*  --   --   --  300*  --   --  138*  --   --  155*  --   --   --   BUN 38*  --  41*  --  44*  --   --   --   --  42*  --   --   --  20  --   --  22  --   --  17  --   --   --   CREATININE 2.27*  --  2.25*  --  2.17*  --   --   --   --  1.98*  --   --   --  1.27*  --   --  1.09  --   --  0.97  --   --   --   CALCIUM 7.4*  --  6.8*  --  6.9*  --   --   --   --  6.9*  --   --   --  7.1*  --   --  8.3*  --   --  8.7  --   --   --   MG  --   --  1.8  --    --   --   --   --   --   --   --   --   --   --   --   --   --   --   --   --   --   --   --   PHOS  --   --  4.4  --   --   --   --   --   --   --   --   --   --   --   --   --   --   --   --   --   --   --   --  AST  --   --  75*  --   --   --   --   --   --  31  --   --   --  14  --   --   --   --   --   --   --   --   --   ALT  --   --  148*  --   --   --   --   --   --  87*  --   --   --  12  --   --   --   --   --   --   --   --   --   ALKPHOS  --   --  44  --   --   --   --   --   --  42  --   --   --  157*  --   --   --   --   --   --   --   --   --   BILITOT  --   --  0.3  --   --   --   --   --   --  0.5  --   --   --  0.3  --   --   --   --   --   --   --   --   --   PROT  --   --  3.9*  --   --   --   --   --   --  4.3*  --   --   --  5.1*  --   --   --   --   --   --   --   --   --   ALBUMIN  --   --  1.8*  --   --   --   --   --   --  2.0*  --   --   --  1.2*  --   --   --   --   --   --   --   --   --   APTT  --   --   --   --   --   --   --   --   --  23*  --   --   --   --   --   --   --   --   --   --   --   --   --   INR  --   --   --   --   --   --   --   --   --  1.10  --   --   --   --   --  1.05  --   --   --   --   --   --  1.03  LATICACIDVEN 2.4*  --  2.6*  --  2.4*  --   --   --   --  1.0  --   --   --   --   --   --   --   --   --   --   --   --   --   TROPONINI  --   --  1.57*  < > 1.08*  --   --   --   --  0.83* 0.66*  --   --   --   --   --   --   --   --   --   --   --   --   PROCALCITON  --   --  11.02  --  7.93  --   --  5.71  --   --   --   --  1.79  --   --   --   --   --   --   --   --   --   --   PROBNP  --   --  5711.0*  --   --   --   --   --   --  3809.0*  --   --   --   --   --   --   --  8731.0*  --   --   --   --   --   PHART  --  7.398  --   --   --   --  7.443  --  7.441  --   --   --   --   --   --   --   --   --   --   --   --   --   --   PCO2ART  --  44.5  --   --   --   --  40.7  --  40.5  --   --   --   --   --   --   --   --   --   --   --   --    --   --   PO2ART  --  103.0*  --   --   --   --  83.9  --  83.0  --   --   --   --   --   --   --   --   --   --   --   --   --   --   < > = values in this interval not displayed.  Recent Labs Lab 07/20/12 0725 07/20/12 1213 07/20/12 1707 07/20/12 2154 07/21/12 0810  GLUCAP 132* 140* 163* 83 76     ASSESSMENT / PLAN:  PULMONARY A: Bilateral pulmonary embolism, s/p IVC filter Acute Respiratory failure, resolved Mild stridor - likely post extubation laryngeal edema, resolved P:  Cont supplemental O2 BDs schedule Cont to monitor in SDU with likely start heparin  CARDIOVASCULAR A:  Massive PE P:  See heme  RENAL A: AKI, resolved Hypokalemia, resolved P:  kvo  GASTROINTESTINAL A: Transaminitis, resolved P:  Cont CHO mod diet  HEMATOLOGIC A:  Bilateral Pulmonary Embolism s/p TNK 7/11  RLE DVT, Studies cannot r/o LLE DVT Acute blood loss anemia s/p 4 U PRBC, 1u FFP and protamine x 1 7/13  Thrombocytopenia, mild R thigh hematoma P:  Resume anticoagulation with goal INR 2.0-3.0 after there on heparin and cbc daily Can change heparin to lovenox if tolerated well with no bleed Would consider leaving IVC filter permanently as she suffered PE while on warfarin, see pulm Age appropriate cancer screening  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 Treat hypo episodes as needed.  Cont ACHS SSI   Blossom. Kriste Basque, MD [facility]

## 2012-07-22 LAB — GLUCOSE, CAPILLARY
Glucose-Capillary: 68 mg/dL — ABNORMAL LOW (ref 70–99)
Glucose-Capillary: 62 mg/dL — ABNORMAL LOW (ref 70–99)
Glucose-Capillary: 192 mg/dL — ABNORMAL HIGH (ref 70–99)

## 2012-07-22 LAB — CBC
HCT: 24.9 % — ABNORMAL LOW (ref 36.0–46.0)
MCH: 28.9 pg (ref 26.0–34.0)
MCHC: 32.1 g/dL (ref 30.0–36.0)
MCV: 89.9 fL (ref 78.0–100.0)
RDW: 14.4 % (ref 11.5–15.5)
WBC: 14.7 10*3/uL — ABNORMAL HIGH (ref 4.0–10.5)

## 2012-07-22 LAB — HEPARIN LEVEL (UNFRACTIONATED): Heparin Unfractionated: 0.71 IU/mL — ABNORMAL HIGH (ref 0.30–0.70)

## 2012-07-22 MED ORDER — METFORMIN HCL 500 MG PO TABS
500.0000 mg | ORAL_TABLET | Freq: Every day | ORAL | Status: DC
  Administered 2012-07-23: 500 mg via ORAL
  Filled 2012-07-22 (×3): qty 1

## 2012-07-22 MED ORDER — WARFARIN SODIUM 5 MG PO TABS
5.0000 mg | ORAL_TABLET | Freq: Once | ORAL | Status: AC
  Administered 2012-07-22: 5 mg via ORAL
  Filled 2012-07-22: qty 1

## 2012-07-22 MED ORDER — GLIPIZIDE 5 MG PO TABS
5.0000 mg | ORAL_TABLET | Freq: Every day | ORAL | Status: DC
  Administered 2012-07-23: 5 mg via ORAL
  Filled 2012-07-22 (×2): qty 1

## 2012-07-22 MED ORDER — HEPARIN (PORCINE) IN NACL 100-0.45 UNIT/ML-% IJ SOLN
1400.0000 [IU]/h | INTRAMUSCULAR | Status: DC
  Administered 2012-07-22: 1000 [IU]/h via INTRAVENOUS
  Administered 2012-07-23 (×2): 1150 [IU]/h via INTRAVENOUS
  Administered 2012-07-24: 1400 [IU]/h via INTRAVENOUS
  Filled 2012-07-22 (×3): qty 250

## 2012-07-22 NOTE — Progress Notes (Signed)
PULMONARY  / CRITICAL CARE MEDICINE  Name: Hannah [name] MRN: [id number] DOB: 09-20-1940    ADMISSION DATE:  07/13/2012 CONSULTATION DATE:  07/13/2012  REFERRING MD :  EDP - Nanavati PRIMARY SERVICE: PCCM  CHIEF COMPLAINT:  SOB, diaphoresis  BRIEF PATIENT DESCRIPTION: [name] is a 71 yo female with a PMH of HTN, HL, DM who presented to [facility] on 7/11 with a pea arrest ,massive PE.= s/p TNK  SIGNIFICANT EVENTS / STUDIES:  7/11 >>> Admitted with PE, PEA arrest, given TNK 7/11 >>> CT of Chest: central and obstructive bilateral pulmonary embolism with right heart strain  7/12 Echo >>> EF 60%, RV mod dilat, decreased systolic fxn, RA mild dilation , PAP -45  7/13 >>> Sig drop in Hbg on Hep , no obvious signs of bleeding /oozing from IV sites, rectal, oral, urine. Given 4u PRBC, protamine, and 1u FFP 7/13 CT Abd Pelvis >>> Intramuscular R thigh hematoma present 7/14 >>> IVC filter placement 7/14 extubated 7/15- nonocclusive rt leg dvt common fem, fem ,popliteal. Cannot exclude dvt involving left femoral vein  7/20> nurse reports BSs in the 60-80 range, eating fair, on Metform & Glyburide Bid=> Rec decr meds to Qam only for now; also note stridor, likely from intub & ETT- need assessment of cords this week...  LINES / TUBES: 7/11 ETT >> 7/14 7/11 R Fem CVC >> d/c'd 7/13 L ALine >> d/c'd 7/12 Foley >>> d/c'd  CULTURES: 7/11 UC >> no growth  ANTIBIOTICS: None  SUBJECTIVE:  No SOB, oob to chair 7/20> BS in 60-80 range on glucophage500Bid & glipizide5Bid (also Pred20 Qam)...  VITAL SIGNS: Temp:  [97.9 F (36.6 C)-98.8 F (37.1 C)] 97.9 F (36.6 C) (07/20 1129) Pulse Rate:  [89-107] 92 (07/20 1200) Resp:  [18-24] 19 (07/20 1200) BP: (121-153)/(48-83) 146/71 mmHg (07/20 1200) SpO2:  [98 %-100 %] 100 % (07/20 1200) FiO2 (%):  [28 %] 28 % (07/19 1523) Weight:  [98.3 kg (216 lb 11.4 oz)-99.1 kg (218 lb 7.6 oz)] 98.3 kg (216 lb 11.4 oz) (07/20 0433) HEMODYNAMICS:    VENTILATOR SETTINGS: Vent Mode:  [-]  FiO2 (%):  [28 %] 28 % INTAKE / OUTPUT: Intake/Output     07/19 0701 - 07/20 0700 07/20 0701 - 07/21 0700   P.O. 480 360   I.V. (mL/kg) 406.5 (4.1) 87 (0.9)   Total Intake(mL/kg) 886.5 (9) 447 (4.5)   Urine (mL/kg/hr) 600 (0.3) 450 (0.8)   Stool 1 (0)    Total Output 601 450   Net +285.5 -3        Urine Occurrence 2 x 2 x   Stool Occurrence  1 x     PHYSICAL EXAMINATION: General:  NAD, coughing often, voice horse w/ stridor Neuro: No focal deficits HEENT: mild stridor Cardiovascular: RRR s M Lungs: no stridor , decreased rt base Abdomen:  Soft, nontender to palpation, +BS Musculoskeletal:  Extremities atraumatic, RLE swelling > LLE, upper rt unchanged Skin:  No rashes noted  LABS:  Recent Labs Lab 07/15/12 1600  07/16/12 0330 07/16/12 0336 07/16/12 0340  07/16/12 0345 07/16/12 0727  07/17/12 0740 07/17/12 1110  07/17/12 1611 07/18/12 0015 07/18/12 0032  07/19/12 0020  07/20/12 0550 07/21/12 0805 07/22/12 0745  HGB 8.0*  < >  --   --   --   --  7.3*  --   < >  --   --   < >  --  7.6*  --   < > 8.8*  < >  9.1* 9.1* 8.0*  WBC 14.1*  < >  --   --   --   --  10.3  --   < >  --   --   < >  --  12.2*  --   < > 12.1*  < > 11.1* 13.7* 14.7*  PLT 101*  < >  --   --   --   --  83*  --   < >  --   --   < >  --  138*  --   < > 190  < > 270 333 340  NA 137  --   --   --   --   --  140  --   --   --  137  --   --  140  --   --  137  --   --   --   --   K 4.0  --   --   --   --   --  3.6  --   --   --  2.9*  --   --  4.5  --   --  4.1  --   --   --   --   CL 101  --   --   --   --   --  105  --   --   --  106  --   --  105  --   --  103  --   --   --   --   CO2 25  --   --   --   --   --  28  --   --   --  17*  --   --  28  --   --  26  --   --   --   --   GLUCOSE 209*  --   --   --   --   --  149*  --   --   --  300*  --   --  138*  --   --  155*  --   --   --   --   BUN 44*  --   --   --   --   --  42*  --   --   --  20  --   --  22  --    --  17  --   --   --   --   CREATININE 2.17*  --   --   --   --   --  1.98*  --   --   --  1.27*  --   --  1.09  --   --  0.97  --   --   --   --   CALCIUM 6.9*  --   --   --   --   --  6.9*  --   --   --  7.1*  --   --  8.3*  --   --  8.7  --   --   --   --   AST  --   --   --   --   --   --  31  --   --   --  14  --   --   --   --   --   --   --   --   --   --  ALT  --   --   --   --   --   --  1*  --   --   --  12  --   --   --   --   --   --   --   --   --   --   ALKPHOS  --   --   --   --   --   --  42  --   --   --  157*  --   --   --   --   --   --   --   --   --   --   BILITOT  --   --   --   --   --   --  0.5  --   --   --  0.3  --   --   --   --   --   --   --   --   --   --   PROT  --   --   --   --   --   --  4.3*  --   --   --  5.1*  --   --   --   --   --   --   --   --   --   --   ALBUMIN  --   --   --   --   --   --  2.0*  --   --   --  1.2*  --   --   --   --   --   --   --   --   --   --   APTT  --   --   --   --   --   --  23*  --   --   --   --   --   --   --   --   --   --   --   --   --   --   INR  --   --   --   --   --   < > 1.10  --   --   --   --   --  1.05  --   --   --   --   --   --  1.03 1.30  LATICACIDVEN 2.4*  --   --   --   --   --  1.0  --   --   --   --   --   --   --   --   --   --   --   --   --   --   TROPONINI 1.08*  --   --   --   --   --  0.83* 0.66*  --   --   --   --   --   --   --   --   --   --   --   --   --   PROCALCITON 7.93  --   --  5.71  --   --   --   --   --  1.79  --   --   --   --   --   --   --   --   --   --   --  PROBNP  --   --   --   --   --   --  3809.0*  --   --   --   --   --   --   --  8731.0*  --   --   --   --   --   --   PHART  --   --  7.443  --  7.441  --   --   --   --   --   --   --   --   --   --   --   --   --   --   --   --   PCO2ART  --   --  40.7  --  40.5  --   --   --   --   --   --   --   --   --   --   --   --   --   --   --   --   PO2ART  --   --  83.9  --  83.0  --   --   --   --   --   --   --   --   --   --   --    --   --   --   --   --   < > = values in this interval not displayed.  Recent Labs Lab 07/22/12 0755 07/22/12 0830 07/22/12 1124 07/22/12 1156 07/22/12 1215  GLUCAP 68* 77 62* 67* 80     ASSESSMENT / PLAN:  PULMONARY A: Bilateral pulmonary embolism, s/p IVC filter Acute Respiratory failure, resolved Mild stridor - likely post extubation laryngeal edema, resolved P:  Cont supplemental O2 BDs schedule Cont to monitor in SDU with likely start heparin  CARDIOVASCULAR A:  Massive PE P:  See heme  RENAL A: AKI, resolved Hypokalemia, resolved P:  kvo  GASTROINTESTINAL A: Transaminitis, resolved P:  Cont CHO mod diet  HEMATOLOGIC A:  Bilateral Pulmonary Embolism s/p TNK 7/11  RLE DVT, Studies cannot r/o LLE DVT Acute blood loss anemia s/p 4 U PRBC, 1u FFP and protamine x 1 7/13  Thrombocytopenia, mild R thigh hematoma P:  Resume anticoagulation with goal INR 2.0-3.0 after there on heparin and cbc daily Can change heparin to lovenox if tolerated well with no bleed Would consider leaving IVC filter permanently as she suffered PE while on warfarin, see pulm Age appropriate cancer screening  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 Treat hypo episodes as needed.  Cont ACHS SSI 7/20> cut Metform/ glyburide to Qam only w/ BS per nurses 60-80 range...   Lonzo Cloud. Kriste Basque, MD [facility]  07/22/12 & 12:53PM

## 2012-07-22 NOTE — Progress Notes (Signed)
Hypoglycemic Event  CBG: 62 @ 1125  Treatment: 15 GM carbohydrate snack (pt drank half of juice)  Symptoms: None  Follow-up CBG: Time:1155 CBG Result:67  Possible Reasons for Event: Inadequate meal intake  Comments/MD notified:Drs. Molli Knock and Kriste Basque @1245     Renette Butters, [name]  Remember to initiate Hypoglycemia Order Set & complete

## 2012-07-22 NOTE — Progress Notes (Signed)
Hypoglycemic Event  CBG: 67 @1155   Treatment: 15 GM carbohydrate snack (pt drank half a milk)  Symptoms: None  Follow-up CBG: Time:1215 CBG Result:80  Possible Reasons for Event: Inadequate meal intake  Comments/MD notified:Drs. Molli Knock and Kriste Basque @1245     Hannah [name], [name]  Remember to initiate Hypoglycemia Order Set & complete

## 2012-07-22 NOTE — Progress Notes (Signed)
ANTICOAGULATION CONSULT NOTE - Follow Up  Pharmacy Consult for Heparin Indication: pulmonary embolus , DVT (s/p TNK)  No Known Allergies  Patient Measurements: Height: 5\' 7"  (170.2 cm) Weight: 216 lb 11.4 oz (98.3 kg) IBW/kg (Calculated) : 61.6 Heparin Dosing Weight: 71 kg  Vital Signs: Temp: 98.1 F (36.7 C) (07/20 0802) Temp src: Oral (07/20 0802) BP: 153/80 mmHg (07/20 0755) Pulse Rate: 107 (07/20 0755)  Labs:  Recent Labs  07/20/12 0550  07/21/12 0805 07/21/12 1947 07/21/12 2122 07/22/12 0745  HGB 9.1*  --  9.1*  --   --  8.0*  HCT 27.8*  --  27.8*  --   --  24.9*  PLT 270  --  333  --   --  340  LABPROT  --   --  13.3  --   --  15.9*  INR  --   --  1.03  --   --  1.30  HEPARINUNFRC  --   < >  --  1.07* 1.86* 1.73*  < > = values in this interval not displayed.  Estimated Creatinine Clearance: 64.1 ml/min (by C-G formula based on Cr of 0.97).  Assessment: 72yo female supratherapeutic on heparin after rate decrease last night.  Her INR is subtherapeutic at 1.30, but trending up after two doses of warfarin.    Goal of Therapy:  INR 2-3 Heparin level 0.3-0.7 Monitor platelets by anticoagulation protocol: Yes   Plan:  1. Hold heparin for 1 hour then decrease rate to 1000units/hr  2. Check level in 8hrs 3. Warfarin 5mg  x1 4. F/u INR in AM 5. Daily CBC  Thank you, Piedad Climes, PharmD Clinical Pharmacist - Resident Pager: [phone] Pharmacy: [phone] 07/22/2012 9:26 AM

## 2012-07-22 NOTE — Progress Notes (Signed)
Hypoglycemic Event  CBG: 68 @ 0755  Treatment: 15 GM carbohydrate snack @0810  when RN was made aware  Symptoms: None  Follow-up CBG: Time:0830 CBG Result:77  Possible Reasons for Event: Inadequate meal intake  Comments/MD notified: Drs. Molli Knock and Kriste Basque @1245     Renette Butters, [name]  Remember to initiate Hypoglycemia Order Set & complete

## 2012-07-22 NOTE — Progress Notes (Signed)
ANTICOAGULATION CONSULT NOTE - Follow Up Consult  Pharmacy Consult for heparin Indication: pulmonary embolus and DVT  Labs:  Recent Labs  07/20/12 0550  07/21/12 0805  07/21/12 2122 07/22/12 0745 07/22/12 1831  HGB 9.1*  --  9.1*  --   --  8.0*  --   HCT 27.8*  --  27.8*  --   --  24.9*  --   PLT 270  --  333  --   --  340  --   LABPROT  --   --  13.3  --   --  15.9*  --   INR  --   --  1.03  --   --  1.30  --   HEPARINUNFRC  --   < >  --   < > 1.86* 1.73* 0.71*  < > = values in this interval not displayed.   Assessment: 72yo female on heparin for bilateral PE s/p IVC filter, last few heparin levels supratherapeutic.  Heparin level now just above goal at 0.71.  No bleeding noted.    Goal of Therapy:  Heparin level 0.3-0.7 units/ml   Plan:  Decrease heparin to 900 units/hr. Check heparin level in 8 hours. Daily heparin level/cbc.  Wendie Simmer, PharmD, BCPS Clinical Pharmacist  Pager: [phone]

## 2012-07-23 DIAGNOSIS — E119 Type 2 diabetes mellitus without complications: Secondary | ICD-10-CM

## 2012-07-23 DIAGNOSIS — E162 Hypoglycemia, unspecified: Secondary | ICD-10-CM

## 2012-07-23 HISTORY — DX: Type 2 diabetes mellitus without complications: E11.9

## 2012-07-23 LAB — GLUCOSE, CAPILLARY
Glucose-Capillary: 95 mg/dL (ref 70–99)
Glucose-Capillary: 46 mg/dL — ABNORMAL LOW (ref 70–99)
Glucose-Capillary: 64 mg/dL — ABNORMAL LOW (ref 70–99)
Glucose-Capillary: 95 mg/dL (ref 70–99)

## 2012-07-23 LAB — CBC
HCT: 27.2 % — ABNORMAL LOW (ref 36.0–46.0)
Hemoglobin: 8.7 g/dL — ABNORMAL LOW (ref 12.0–15.0)
MCH: 28.9 pg (ref 26.0–34.0)
MCHC: 32 g/dL (ref 30.0–36.0)
MCV: 90.4 fL (ref 78.0–100.0)

## 2012-07-23 LAB — HEPARIN LEVEL (UNFRACTIONATED): Heparin Unfractionated: 0.17 IU/mL — ABNORMAL LOW (ref 0.30–0.70)

## 2012-07-23 LAB — PROTIME-INR: INR: 1.51 — ABNORMAL HIGH (ref 0.00–1.49)

## 2012-07-23 MED ORDER — ENSURE COMPLETE PO LIQD
237.0000 mL | Freq: Two times a day (BID) | ORAL | Status: DC
  Administered 2012-07-23 – 2012-07-26 (×5): 237 mL via ORAL

## 2012-07-23 MED ORDER — WARFARIN SODIUM 5 MG PO TABS
5.0000 mg | ORAL_TABLET | Freq: Once | ORAL | Status: AC
  Administered 2012-07-23: 5 mg via ORAL
  Filled 2012-07-23: qty 1

## 2012-07-23 NOTE — Progress Notes (Signed)
ANTICOAGULATION CONSULT NOTE - Follow Up Consult  Pharmacy Consult for heparin Indication: pulmonary embolus and DVT  Labs:  Recent Labs  07/21/12 0805  07/22/12 0745 07/22/12 1831 07/23/12 0405  HGB 9.1*  --  8.0*  --  8.7*  HCT 27.8*  --  24.9*  --  27.2*  PLT 333  --  340  --  347  LABPROT 13.3  --  15.9*  --  17.8*  INR 1.03  --  1.30  --  1.51*  HEPARINUNFRC  --   < > 1.73* 0.71* 0.17*  < > = values in this interval not displayed.   Assessment: 72yo female now subtherapeutic on heparin after rate decrease for high levels.  Goal of Therapy:  Heparin level 0.3-0.7 units/ml   Plan:  Will increase heparin gtt slightly to 950 units/hr given level was high at 1000 units/hr and check level in 6hr.  Vernard Gambles, PharmD, BCPS  07/23/2012,6:34 AM

## 2012-07-23 NOTE — Progress Notes (Signed)
NUTRITION FOLLOW UP  Intervention:   Chocolate Ensure Complete PO BID, each supplement provides 350 kcal and 13 grams of protein.  Nutrition Dx:   Inadequate oral intake related to sore throat and poor appetite as evidenced by poor intake of meals. Ongoing.  New Goal:   Intake to meet >90% of estimated nutrition needs. Unmet.  Monitor:   PO intake, labs, weight trend.  Assessment:   Patient continues on CHO-modified medium calorie diet. Poor PO intake continues; patient consuming ~ 25% of meals. Noted a few hypoglycemic events yesterday and this morning. Patient c/o sore throat when eating almost everything, even pancakes this morning. Nutritional Services Ambassador is helping patient choose meals. Patient says that she is having chicken noodle soup for lunch today. Willing to try Ensure Supplement to maximize oral intake.  Height: Ht Readings from Last 1 Encounters:  07/17/12 5\' 7"  (1.702 m)    Weight Status:  Trending back down with fluid status Wt Readings from Last 1 Encounters:  07/23/12 212 lb 4.9 oz (96.3 kg)  07/17/12  237 lb 10.5 oz (107.8 kg)  07/14/12  222 lb 14.2 oz (101.1 kg)   Re-estimated needs:  Kcal: 1600-1800 Protein: 90-100 gm Fluid: 1.6-1.8 L  Skin: no issues  Diet Order: Carb Control   Intake/Output Summary (Last 24 hours) at 07/23/12 1140 Last data filed at 07/23/12 1136  Gross per 24 hour  Intake 720.13 ml  Output   2050 ml  Net -1329.87 ml    Last BM: 7/19   Labs:   Recent Labs Lab 07/17/12 1110 07/18/12 0015 07/19/12 0020  NA 137 140 137  K 2.9* 4.5 4.1  CL 106 105 103  CO2 17* 28 26  BUN 20 22 17   CREATININE 1.27* 1.09 0.97  CALCIUM 7.1* 8.3* 8.7  GLUCOSE 300* 138* 155*    CBG (last 3)   Recent Labs  07/22/12 2152 07/23/12 0630 07/23/12 0811  GLUCAP 147* 95 105*    Scheduled Meds: . albuterol  2.5 mg Nebulization Q6H  . glipiZIDE  5 mg Oral QAC breakfast  . insulin aspart  0-9 Units Subcutaneous TID WC  .  levothyroxine  50 mcg Oral QAC breakfast  . magic mouthwash  5 mL Oral QID  . metFORMIN  500 mg Oral Q breakfast  . metoprolol tartrate  25 mg Oral BID  . sodium chloride  1,000 mL Intravenous Once  . Warfarin - Pharmacist Dosing Inpatient   Does not apply q1800    Continuous Infusions: . heparin 950 Units/hr (07/23/12 1000)  None   Joaquin Courts, RD, LDN, CNSC Pager [phone] After Hours Pager [phone]

## 2012-07-23 NOTE — Progress Notes (Signed)
Patient has been transferred to 6N bed 20 via wheelchair. Phone report has been called to [facility]. Family present and aware of the transfer and new room number. No further questions or concerns have been addressed.

## 2012-07-23 NOTE — Progress Notes (Signed)
Pt was sitting up in chair by bed when I arrived. Pt's daughter and son-in-law were bedside. Pt's family spoke of how much btr pt is today compared to when she came into the hospital. Pt's family was thankful for miracle and for care from staff. Had prayer w/pt and family after offering empathic listening and spt. Family thankful for visit and prayer. Marjory Lies Chaplain  07/23/12 1200  Clinical Encounter Type  Visited With Patient and family together

## 2012-07-23 NOTE — Progress Notes (Addendum)
Hypoglycemic Event  CBG: 54   Treatment: 15 GM carbohydrate snack   Symptoms: None  Follow-up CBG: Time:1220  CBG Result:46  Possible Reasons for Event: Inadequate meal intake  Comments/MD notified:dietican present on the floor and made aware.Will notify MD. After rechecking blood sugar after initial read blood sugar now 46, have ginven the patient orange juice and will recheck the sugar.    Hall Busing  Remember to initiate Hypoglycemia Order Set & complete

## 2012-07-23 NOTE — Progress Notes (Signed)
PULMONARY  / CRITICAL CARE MEDICINE  Name: Hannah [name] MRN: [id number] DOB: 1940/06/29    ADMISSION DATE:  07/13/2012 CONSULTATION DATE:  07/13/2012  REFERRING MD :  EDP - Nanavati PRIMARY SERVICE: PCCM  CHIEF COMPLAINT:  SOB, diaphoresis  BRIEF PATIENT DESCRIPTION: Hannah [name] is a 72 yo female with a PMH of HTN, HL, DM who presented to [facility] on 7/11 with a pea arrest ,massive PE.= s/p TNK  SIGNIFICANT EVENTS / STUDIES:  7/11 >>> Admitted with PE, PEA arrest, given TNK 7/11 >>> CT of Chest: central and obstructive bilateral pulmonary embolism with right heart strain  7/12 Echo >>> EF 60%, RV mod dilat, decreased systolic fxn, RA mild dilation , PAP -45  7/13 >>> Sig drop in Hbg on Hep , no obvious signs of bleeding /oozing from IV sites, rectal, oral, urine. Given 4u PRBC, protamine, and 1u FFP 7/13 CT Abd Pelvis >>> Intramuscular R thigh hematoma present 7/14 >>> IVC filter placement 7/14 extubated 7/15- nonocclusive rt leg dvt common fem, fem ,popliteal. Cannot exclude dvt involving left femoral vein  7/20> nurse reports BSs in the 60-80 range, eating fair, on Metform & Glyburide Bid=> Rec decr meds to Qam only for now; also note stridor, likely from intub & ETT- need assessment of cords this week 7/20 Transfer to med-surg  LINES / TUBES: 7/11 ETT >> 7/14 7/11 R Fem CVC >> d/c'd 7/13 L ALine >> d/c'd 7/12 Foley >>> d/c'd  CULTURES: 7/11 UC >> no growth  ANTIBIOTICS: None  SUBJECTIVE:  No new complaints. No distress. Tolerating heparin  VITAL SIGNS: Temp:  [97.9 F (36.6 C)-98.4 F (36.9 C)] 97.9 F (36.6 C) (07/21 2204) Pulse Rate:  [78-106] 106 (07/21 2204) Resp:  [11-21] 20 (07/21 2204) BP: (135-157)/(48-93) 152/71 mmHg (07/21 2204) SpO2:  [100 %] 100 % (07/21 2204) Weight:  [96.3 kg (212 lb 4.9 oz)-96.4 kg (212 lb 8.4 oz)] 96.3 kg (212 lb 4.9 oz) (07/21 0500) HEMODYNAMICS:   VENTILATOR SETTINGS:   INTAKE / OUTPUT: Intake/Output     07/21 0701 -  07/22 0700   P.O.    I.V. (mL/kg)    Total Intake(mL/kg)    Urine (mL/kg/hr) 550 (0.4)   Total Output 550   Net -550       Stool Occurrence 2 x     PHYSICAL EXAMINATION: General:  NAD, coughing often, voice horse w/ stridor Neuro: No focal deficits HEENT: mild stridor Cardiovascular: RRR s M Lungs: no stridor , decreased rt base Abdomen:  Soft, nontender to palpation, +BS Musculoskeletal:  Extremities atraumatic, RLE swelling > LLE Skin:  No rashes noted  LABS:  Recent Labs Lab 07/17/12 0740  07/17/12 1110  07/18/12 0015 07/18/12 0032  07/19/12 0020  07/21/12 0805 07/22/12 0745 07/23/12 0405  HGB  --   --   --   < > 7.6*  --   < > 8.8*  < > 9.1* 8.0* 8.7*  WBC  --   --   --   < > 12.2*  --   < > 12.1*  < > 13.7* 14.7* 15.2*  PLT  --   --   --   < > 138*  --   < > 190  < > 333 340 347  NA  --   --  137  --  140  --   --  137  --   --   --   --   K  --   < >  2.9*  --  4.5  --   --  4.1  --   --   --   --   CL  --   --  106  --  105  --   --  103  --   --   --   --   CO2  --   --  17*  --  28  --   --  26  --   --   --   --   GLUCOSE  --   --  300*  --  138*  --   --  155*  --   --   --   --   BUN  --   --  20  --  22  --   --  17  --   --   --   --   CREATININE  --   --  1.27*  --  1.09  --   --  0.97  --   --   --   --   CALCIUM  --   --  7.1*  --  8.3*  --   --  8.7  --   --   --   --   AST  --   --  14  --   --   --   --   --   --   --   --   --   ALT  --   --  12  --   --   --   --   --   --   --   --   --   ALKPHOS  --   --  157*  --   --   --   --   --   --   --   --   --   BILITOT  --   --  0.3  --   --   --   --   --   --   --   --   --   PROT  --   --  5.1*  --   --   --   --   --   --   --   --   --   ALBUMIN  --   --  1.2*  --   --   --   --   --   --   --   --   --   INR  --   --   --   < >  --   --   --   --   --  1.03 1.30 1.51*  PROCALCITON 1.79  --   --   --   --   --   --   --   --   --   --   --   PROBNP  --   --   --   --   --  8731.0*  --   --    --   --   --   --   < > = values in this interval not displayed.  Recent Labs Lab 07/23/12 1221 07/23/12 1244 07/23/12 1358 07/23/12 1702 07/23/12 2154  GLUCAP 46* 64* 62* 95 93     ASSESSMENT / PLAN:  PULMONARY A: Bilateral pulmonary embolism, s/p IVC filter Acute Respiratory failure, resolved Mild stridor - likely post extubation laryngeal edema P:  Cont supplemental  O2 BDs schedule Tolerating heparin, transition to warfarin. Will need lifelong Transfer to med-surg   RENAL A: AKI, resolved Hypokalemia, resolved P:  kvo  GASTROINTESTINAL A: Transaminitis, resolved P:  Cont CHO mod diet  HEMATOLOGIC A:  Bilateral Pulmonary Embolism s/p TNK 7/11  RLE DVT, Studies cannot r/o LLE DVT Acute blood loss anemia s/p 4 U PRBC, 1u FFP and protamine x 1 7/13  Thrombocytopenia, resolved R thigh hematoma, resolving P:  Needs age appropriate screening for malignancy - breast, colon Hypercoag w/u negative   ENDOCRINE A: Hypothyroid  DM  Hypoglycemia, mild, recurrent P:  D/C oral agents - glyburide and metformin Cont sensitive scale on ACHS SSI Cont CHO mod diet   Billy Fischer, MD ; [facility] service [place] [phone].  After 5:30 PM or weekends, call [phone]

## 2012-07-23 NOTE — Progress Notes (Signed)
Occupational Therapy Treatment Patient Details Name: Hannah [name] MRN: [id number] DOB: Jul 03, 1940 Today's Date: 07/23/2012 Time: 5621-3086 OT Time Calculation (min): 33 min  OT Assessment / Plan / Recommendation  OT comments  Improvement noted in mobility.  Primarily limited by endurance.  Instructed in pacing and purse lip breathing.  On RA throughout session, noted to desat to 87 and HR increased to 140 with activity.  Needs to continue work on ADL and energy conservation strategies.  Follow Up Recommendations  Home health OT;Supervision/Assistance - 24 hour    Barriers to Discharge       Equipment Recommendations  3 in 1 bedside comode;Tub/shower bench    Recommendations for Other Services    Frequency Min 2X/week   Progress towards OT Goals Progress towards OT goals: Progressing toward goals  Plan Discharge plan remains appropriate    Precautions / Restrictions Precautions Precautions: Fall Restrictions Weight Bearing Restrictions: No   Pertinent Vitals/Pain HR 115 at rest, 140 at max with ambulation, 02 at 100% at rest, desats to 87 with exertion but rebounds quickly to mid 90's    ADL  Grooming: Wash/dry hands;Set up Where Assessed - Grooming: Unsupported sitting Upper Body Dressing: Minimal assistance Where Assessed - Upper Body Dressing: Unsupported sitting Toilet Transfer: Min guard Toilet Transfer Method: Sit to stand Toilet Transfer Equipment: Raised toilet seat with arms (or 3-in-1 over toilet) Toileting - Clothing Manipulation and Hygiene: Min guard Where Assessed - Glass blower/designer Manipulation and Hygiene: Standing Equipment Used: Gait belt;Rolling walker Transfers/Ambulation Related to ADLs: min guard assist with rollator from home ADL Comments: Primarily impaired by decreased activity tolerance, on RA throughout.    OT Diagnosis:    OT Problem List:   OT Treatment Interventions:     OT Goals(current goals can now be found in the care plan  section) Acute Rehab OT Goals Patient Stated Goal: to return home with family  Visit Information  Last OT Received On: 07/23/12 Assistance Needed: +2 PT/OT Co-Evaluation/Treatment: Yes History of Present Illness: Pt admit fro PEA arrest, Massive PE, and bil LE DVTs.  IVC filter in place.      Subjective Data      Prior Functioning       Cognition  Cognition Arousal/Alertness: Awake/alert Behavior During Therapy: WFL for tasks assessed/performed Overall Cognitive Status: Within Functional Limits for tasks assessed    Mobility  Bed Mobility Bed Mobility: Rolling Left;Left Sidelying to Sit;Sitting - Scoot to Edge of Bed Rolling Left: 5: Supervision;With rail Left Sidelying to Sit: 5: Supervision;HOB elevated;With rails Sitting - Scoot to Edge of Bed: 5: Supervision;With rail Transfers Sit to Stand: 4: Min guard;With upper extremity assist;From bed Stand to Sit: 4: Min guard;With upper extremity assist;To chair/3-in-1;With armrests Details for Transfer Assistance: needed verbal cues for hand placement as well as instruction in use of 4 wheeled RW with brakes as tjhis was the first time pt used this RW.      Exercises      Balance Static Standing Balance Static Standing - Balance Support: Bilateral upper extremity supported;During functional activity Static Standing - Level of Assistance: 5: Stand by assistance Static Standing - Comment/# of Minutes: Pt stood 4-5 minutes to clean herself and almost cleaned self independently but became fatigued and needed help to clean the last little bit of stool.  Pt had to rest sitting after cleaning herself due to fatigue.     End of Session OT - End of Session Activity Tolerance: Patient limited by fatigue Patient left: in  chair;with call bell/phone within reach;with family/visitor present  GO     Evern Bio 07/23/2012, 10:59 AM [phone]

## 2012-07-23 NOTE — Progress Notes (Signed)
Physical Therapy Treatment Patient Details Name: Hannah [name] MRN: [id number] DOB: 10-11-1940 Today's Date: 07/23/2012 Time: 4098-1191 PT Time Calculation (min): 41 min  PT Assessment / Plan / Recommendation  PT Comments   Pt admitted with PEA arrest and PE. Pt currently with functional limitations due to poor endurance and need for education with RW safety and pursed lip breathing. Overall, improves daily with increasing ambulation distance.   Pt will benefit from skilled PT to increase their independence and safety with mobility to allow discharge to the venue listed below.   Follow Up Recommendations  Home health PT;Supervision/Assistance - 24 hour                 Equipment Recommendations  None recommended by PT        Frequency Min 3X/week   Progress towards PT Goals Progress towards PT goals: Progressing toward goals  Plan Current plan remains appropriate    Precautions / Restrictions Precautions Precautions: Fall Restrictions Weight Bearing Restrictions: No   Pertinent Vitals/Pain HR 115-140 bpm, O2 sats 87% on RA one time during ambulation.  Otherwise, sats >90% on RA with ambulation.  Replaced 2LO2 at end of session.  Reviewed incentive spirometer as well.      Mobility  Bed Mobility Bed Mobility: Rolling Left;Left Sidelying to Sit;Sitting - Scoot to Edge of Bed Rolling Left: 5: Supervision;With rail Left Sidelying to Sit: 5: Supervision;HOB elevated;With rails Sitting - Scoot to Edge of Bed: 5: Supervision;With rail Transfers Transfers: Sit to Stand;Stand to Sit Sit to Stand: 4: Min guard;With upper extremity assist;From bed Stand to Sit: 4: Min guard;With upper extremity assist;To chair/3-in-1;With armrests Details for Transfer Assistance: needed verbal cues for hand placement as well as instruction in use of 4 wheeled RW with brakes as tjhis was the first time pt used this RW.   Ambulation/Gait Ambulation/Gait Assistance: 4: Min guard Ambulation Distance  (Feet): 115 Feet (Ambulated 15 feet, 55 feet and 45 feet) Assistive device: Rolling walker Ambulation/Gait Assistance Details: Pt ambulated with RW to bathroom, then to hallway with one sitting rest break in the hallway and then back to room.  Continues to need chair follow for safety.  Occasional assist to steer RW but did not need steadying assist today.  Sats on RA in room 100%.  Desat to 87% on RA during the walk to 55 feet which pt improved sats by pursed lip breathiing.  Pt did not desat the last walk as she did well with pursed lip breathiing.   Gait Pattern: Decreased stride length;Step-through pattern;Trunk flexed Gait velocity: decreased- very slow gait Stairs: No Wheelchair Mobility Wheelchair Mobility: No    PT Goals (current goals can now be found in the care plan section)    Visit Information  Last PT Received On: 07/23/12 Assistance Needed: +2 (chair follow) PT/OT Co-Evaluation/Treatment: Yes History of Present Illness: Pt admit fro PEA arrest, Massive PE, and bil LE DVTs.  IVC filter in place.      Subjective Data  Subjective: "I still get short of breath."   Cognition  Cognition Arousal/Alertness: Awake/alert Behavior During Therapy: WFL for tasks assessed/performed Overall Cognitive Status: Within Functional Limits for tasks assessed    Balance  Static Standing Balance Static Standing - Balance Support: Bilateral upper extremity supported;During functional activity Static Standing - Level of Assistance: 5: Stand by assistance Static Standing - Comment/# of Minutes: Pt stood 4-5 minutes to clean herself and almost cleaned self independently but became fatigued and needed help to clean the  last little bit of stool.  Pt had to rest sitting after cleaning herself due to fatigue.    End of Session PT - End of Session Equipment Utilized During Treatment: Gait belt;Oxygen Activity Tolerance: Patient limited by fatigue Patient left: in chair;with call bell/phone within  reach Nurse Communication: Mobility status        INGOLD,Marvelyn Bouchillon 07/23/2012, 10:42 AM [facility] [phone] (pager)

## 2012-07-23 NOTE — Progress Notes (Signed)
ANTICOAGULATION CONSULT NOTE - Follow Up Consult  Pharmacy Consult for Heparin + Warfarin Indication: PE/DVT  No Known Allergies  Patient Measurements: Height: 5\' 7"  (170.2 cm) Weight: 212 lb 4.9 oz (96.3 kg) IBW/kg (Calculated) : 61.6 Heparin Dosing Weight: 83 kg  Vital Signs: Temp: 98.3 F (36.8 C) (07/21 0754) Temp src: Oral (07/21 0754) BP: 157/83 mmHg (07/21 0754) Pulse Rate: 101 (07/21 1006)  Labs:  Recent Labs  07/21/12 0805  07/22/12 0745 07/22/12 1831 07/23/12 0405 07/23/12 1355  HGB 9.1*  --  8.0*  --  8.7*  --   HCT 27.8*  --  24.9*  --  27.2*  --   PLT 333  --  340  --  347  --   LABPROT 13.3  --  15.9*  --  17.8*  --   INR 1.03  --  1.30  --  1.51*  --   HEPARINUNFRC  --   < > 1.73* 0.71* 0.17* 0.16*  < > = values in this interval not displayed.  Estimated Creatinine Clearance: 63.4 ml/min (by C-G formula based on Cr of 0.97).   Medications:  Heparin at 950 units/hr (9.5 ml/hr)  Assessment: 72 y.o. F on heparin for new PE/DVT this admission with a SUBtherapeutic heparin level this afternoon (HL 0.16 << 0.17, goal of 0.3-0.7). INR is also SUBtherapeutic this afternoon though trending up (INR 1.51 << 1.3, goal of 2-3). Hgb/Hct low but stable and trending up slightly, plts wnl. No overt s/sx of bleeding noted. Today is VTE overlap D#4/5 as recommended per CHEST guidlines  The patient's heparin levels have gone up and down on various rates over the past couple of days. Will increase conservatively this afternoon.  Goal of Therapy:  INR 2-3 Heparin level 0.3-0.7 units/ml Monitor platelets by anticoagulation protocol: Yes   Plan:  1. Increase heparin to 1150 units/hr (11.5 ml/hr) 2. Warfarin 5 mg x 1 dose at 1800 today 3. Will continue to monitor for any signs/symptoms of bleeding and will follow up with heparin level in the a.m.   Georgina Pillion, PharmD, BCPS Clinical Pharmacist Pager: [phone] 07/23/2012 3:19 PM

## 2012-07-23 NOTE — Progress Notes (Signed)
Pt transferred to 6 [address] from 3300. Oriented to unit and staff and routine. Family at bedside

## 2012-07-24 LAB — GLUCOSE, CAPILLARY
Glucose-Capillary: 92 mg/dL (ref 70–99)
Glucose-Capillary: 156 mg/dL — ABNORMAL HIGH (ref 70–99)

## 2012-07-24 LAB — CBC
Hemoglobin: 8.2 g/dL — ABNORMAL LOW (ref 12.0–15.0)
MCH: 28.9 pg (ref 26.0–34.0)
MCHC: 31.9 g/dL (ref 30.0–36.0)

## 2012-07-24 LAB — HEPARIN LEVEL (UNFRACTIONATED): Heparin Unfractionated: <0.1 IU/mL — ABNORMAL LOW (ref 0.30–0.70)

## 2012-07-24 LAB — PROTIME-INR: Prothrombin Time: 20.8 seconds — ABNORMAL HIGH (ref 11.6–15.2)

## 2012-07-24 MED ORDER — BUDESONIDE 0.25 MG/2ML IN SUSP
0.2500 mg | Freq: Two times a day (BID) | RESPIRATORY_TRACT | Status: DC
  Administered 2012-07-24 – 2012-07-25 (×4): 0.25 mg via RESPIRATORY_TRACT
  Filled 2012-07-24 (×7): qty 2

## 2012-07-24 MED ORDER — COUMADIN BOOK
Freq: Once | Status: AC
  Administered 2012-07-24: 19:00:00
  Filled 2012-07-24: qty 1

## 2012-07-24 MED ORDER — WARFARIN SODIUM 5 MG PO TABS
5.0000 mg | ORAL_TABLET | Freq: Once | ORAL | Status: AC
  Administered 2012-07-24: 5 mg via ORAL
  Filled 2012-07-24: qty 1

## 2012-07-24 MED ORDER — ALBUTEROL SULFATE (5 MG/ML) 0.5% IN NEBU
2.5000 mg | INHALATION_SOLUTION | Freq: Four times a day (QID) | RESPIRATORY_TRACT | Status: DC
  Administered 2012-07-24 – 2012-07-26 (×7): 2.5 mg via RESPIRATORY_TRACT
  Filled 2012-07-24 (×7): qty 0.5

## 2012-07-24 MED ORDER — HEPARIN (PORCINE) IN NACL 100-0.45 UNIT/ML-% IJ SOLN
1750.0000 [IU]/h | INTRAMUSCULAR | Status: DC
  Administered 2012-07-24 – 2012-07-25 (×2): 1600 [IU]/h via INTRAVENOUS
  Filled 2012-07-24: qty 250

## 2012-07-24 MED ORDER — IPRATROPIUM BROMIDE 0.02 % IN SOLN
0.5000 mg | Freq: Four times a day (QID) | RESPIRATORY_TRACT | Status: DC
  Administered 2012-07-24 – 2012-07-26 (×7): 0.5 mg via RESPIRATORY_TRACT
  Filled 2012-07-24 (×7): qty 2.5

## 2012-07-24 NOTE — Progress Notes (Signed)
Referral to [facility] for [facility], PT/OT, aide and 3 n 1

## 2012-07-24 NOTE — Progress Notes (Signed)
PULMONARY  / CRITICAL CARE MEDICINE  Name: Hannah [name] MRN: [id number] DOB: May 20, 1940    ADMISSION DATE:  07/13/2012 CONSULTATION DATE:  07/13/2012  REFERRING MD :  EDP - Nanavati PRIMARY SERVICE: PCCM  CHIEF COMPLAINT:  SOB, diaphoresis  BRIEF PATIENT DESCRIPTION: Hannah [name] is a 72 yo female with a PMH of HTN, HL, DM who presented to [facility] on 7/11 with a pea arrest ,massive PE.= s/p TNK  SIGNIFICANT EVENTS / STUDIES:  7/11 >>> Admitted with PE, PEA arrest, given TNK 7/11 >>> CT of Chest: central and obstructive bilateral pulmonary embolism with right heart strain  7/12 Echo >>> EF 60%, RV mod dilat, decreased systolic fxn, RA mild dilation , PAP -45  7/13 >>> Sig drop in Hbg on Hep , no obvious signs of bleeding /oozing from IV sites, rectal, oral, urine. Given 4u PRBC, protamine, and 1u FFP 7/13 CT Abd Pelvis >>> Intramuscular R thigh hematoma present 7/14 >>> IVC filter placement 7/14 extubated 7/15- nonocclusive rt leg dvt common fem, fem ,popliteal. Cannot exclude dvt involving left femoral vein  7/20> nurse reports BSs in the 60-80 range, eating fair, on Metform & Glyburide Bid=> Rec decr meds to Qam only for now; also note stridor, likely from intub & ETT- need assessment of cords this week 7/20 Transfer to med-surg  LINES / TUBES: 7/11 ETT >> 7/14 7/11 R Fem CVC >> d/c'd 7/13 L ALine >> d/c'd 7/12 Foley >>> d/c'd  CULTURES: 7/11 UC >> no growth  ANTIBIOTICS: None  SUBJECTIVE:  07/24/12: sTIL WITH mild stridor. Tough to stick for blood draws but she is reluctant about hjaving PICC line  VITAL SIGNS: Temp:  [97.9 F (36.6 C)-98.8 F (37.1 C)] 98.8 F (37.1 C) (07/22 0600) Pulse Rate:  [78-106] 101 (07/22 1006) Resp:  [17-20] 20 (07/22 0600) BP: (138-157)/(48-93) 152/69 mmHg (07/22 1006) SpO2:  [98 %-100 %] 100 % (07/22 0600) Weight:  [96.6 kg (212 lb 15.4 oz)] 96.6 kg (212 lb 15.4 oz) (07/22 0600) HEMODYNAMICS:   VENTILATOR SETTINGS:   INTAKE /  OUTPUT: Intake/Output     07/21 0701 - 07/22 0700 07/22 0701 - 07/23 0700   P.O.     I.V. (mL/kg)     Total Intake(mL/kg)     Urine (mL/kg/hr) 1600 (0.7)    Total Output 1600     Net -1600          Stool Occurrence 2 x      PHYSICAL EXAMINATION: General:  NAD, coughing often, voice horse w/ stridor Neuro: No focal deficits HEENT: mild stridor Cardiovascular: RRR s M Lungs: no stridor , decreased rt base Abdomen:  Soft, nontender to palpation, +BS Musculoskeletal:  Extremities atraumatic, RLE swelling > LLE Skin:  No rashes noted but has extensive bruising on both UE  LABS:  PULMONARY No results found for this basename: PHART, PCO2, PCO2ART, PO2, PO2ART, HCO3, TCO2, O2SAT,  in the last 168 hours  CBC  Recent Labs Lab 07/22/12 0745 07/23/12 0405 07/24/12 0550  HGB 8.0* 8.7* 8.2*  HCT 24.9* 27.2* 25.7*  WBC 14.7* 15.2* 11.6*  PLT 340 347 340    COAGULATION  Recent Labs Lab 07/20/12 0550 07/21/12 0805 07/22/12 0745 07/23/12 0405 07/24/12 0550  PLT 270 333 340 347 340     Recent Labs Lab 07/17/12 1611 07/21/12 0805 07/22/12 0745 07/23/12 0405 07/24/12 0550  INR 1.05 1.03 1.30 1.51* 1.85*    CARDIAC  No results found for this basename: TROPONINI,  in the  last 168 hours   Recent Labs Lab 07/18/12 0032  PROBNP 8731.0*     CHEMISTRY   Recent Labs Lab 07/18/12 0015 07/19/12 0020  NA 140 137  K 4.5 4.1  CL 105 103  CO2 28 26  GLUCOSE 138* 155*  BUN 22 17  CREATININE 1.09 0.97  CALCIUM 8.3* 8.7    INFECTIOUS   Recent Labs Lab 07/20/12 0550 07/21/12 0805 07/22/12 0745 07/23/12 0405 07/24/12 0550  WBC 11.1* 13.7* 14.7* 15.2* 11.6*   No results found for this basename: LATICACIDVEN, PROCALCITON,  in the last 168 hours   ENDOCRINE  CBG (last 3)   Recent Labs  07/23/12 1702 07/23/12 2154 07/24/12 0805  GLUCAP 95 93 92      LIVER  Recent Labs Lab 07/17/12 1611 07/21/12 0805 07/22/12 0745 07/23/12 0405  07/24/12 0550  INR 1.05 1.03 1.30 1.51* 1.85*      IMAGING x48h  - No results found.     ASSESSMENT / PLAN:  PULMONARY A: Bilateral pulmonary embolism, s/p IVC filter Acute Respiratory failure, resolved Mild stridor - likely post extubation laryngeal edema  07/24/12: stil lwith mild stridor.On IV heparin and coumadin  P:  Cont supplemental O2 BDs schedule but addd atrovent and pulmicort Tolerating heparin, transition to warfarin. Will need lifelong. Goal INR ? > 3 Transfer to med-surg   RENAL A: AKI, resolved Hypokalemia, resolved P:  kvo  GASTROINTESTINAL A: Transaminitis, resolved P:  Cont CHO mod diet  HEMATOLOGIC A:  Bilateral Pulmonary Embolism s/p TNK 7/11  RLE DVT, Studies cannot r/o LLE DVT Acute blood loss anemia s/p 4 U PRBC, 1u FFP and protamine x 1 7/13  Thrombocytopenia, resolved R thigh hematoma, resolving P:  Needs age appropriate screening for malignancy - breast, colon Hypercoag w/u negative PRBC vfor hgb < 7gm% only   ENDOCRINE A: Hypothyroid  DM  Hypoglycemia, mild, recurrent P:  D/C oral agents - glyburide and metformin Cont sensitive scale on ACHS SSI Cont CHO mod diet   gENERAL Consult PT Dtr updated.  Use PIV and sticks for blood draw for now    Dr. Kalman Shan, M.D., [facility].C.P Pulmonary and Critical Care Medicine Staff Physician [facility] [place] [facility] Pager: [phone], If no answer or between  15:00h - 7:00h: call [phone]  07/24/2012 11:27 AM

## 2012-07-24 NOTE — Progress Notes (Signed)
Anticiagulation consult note: Heparin Indication: PE/DVT  Heparin level = <0.1 on 1400 units/hr Goal hepain level = 0/3-0/7  The heparin level is subtherapeutical. For some reason, this pt seems to be requiring more heparin that previously. Confirmed that the heparin had not been turned off. Will increase the rate further to 1600 units/hr. Will followup AM heparin level.  Cardell Peach, Pharm D

## 2012-07-24 NOTE — Progress Notes (Signed)
ANTICOAGULATION CONSULT NOTE - Follow Up Consult  Pharmacy Consult:  Heparin + Warfarin (Overlap D#5/5) Indication: PE/DVT  No Known Allergies  Patient Measurements: Height: 5\' 7"  (170.2 cm) Weight: 212 lb 15.4 oz (96.6 kg) IBW/kg (Calculated) : 61.6 Heparin Dosing Weight: 83 kg  Vital Signs: Temp: 98.8 F (37.1 C) (07/22 0600) Temp src: Oral (07/22 0600) BP: 156/63 mmHg (07/22 0600) Pulse Rate: 99 (07/22 0600)  Labs:  Recent Labs  07/22/12 0745  07/23/12 0405 07/23/12 1355 07/24/12 0550  HGB 8.0*  --  8.7*  --  8.2*  HCT 24.9*  --  27.2*  --  25.7*  PLT 340  --  347  --  340  LABPROT 15.9*  --  17.8*  --  20.8*  INR 1.30  --  1.51*  --  1.85*  HEPARINUNFRC 1.73*  < > 0.17* 0.16* <0.10*  < > = values in this interval not displayed.  Estimated Creatinine Clearance: 63.5 ml/min (by C-G formula based on Cr of 0.97).     Assessment: 47 YOF with history of DVT on Coumadin PTA.  Found to have new PE and DVT, s/p IVC filter placement.  Currently on day #5/5 overlap of IV heparin and Coumadin.  Heparin level undetectable and INR is approaching goal.  No complications with heparin infusion per RN.  No bleeding reported and hematoma is resolving per documentation.   Goal of Therapy:  INR 2-3 Heparin level 0.3-0.7 units/ml Monitor platelets by anticoagulation protocol: Yes    Plan:  - Increase heparin gtt to 1400 units/hr - Check 8 hr HL - Coumadin 5mg  PO today - Daily HL / CBC / PT / INR - Consider resuming home Zocor as LFTs have returned to baseline    Robina Hamor D. Laney Potash, PharmD, BCPS Pager:  [phone] 07/24/2012, 8:46 AM

## 2012-07-25 ENCOUNTER — Other Ambulatory Visit: Payer: Self-pay | Admitting: *Deleted

## 2012-07-25 DIAGNOSIS — I82403 Acute embolism and thrombosis of unspecified deep veins of lower extremity, bilateral: Secondary | ICD-10-CM

## 2012-07-25 DIAGNOSIS — Z01810 Encounter for preprocedural cardiovascular examination: Secondary | ICD-10-CM

## 2012-07-25 LAB — GLUCOSE, CAPILLARY
Glucose-Capillary: 131 mg/dL — ABNORMAL HIGH (ref 70–99)
Glucose-Capillary: 163 mg/dL — ABNORMAL HIGH (ref 70–99)
Glucose-Capillary: 143 mg/dL — ABNORMAL HIGH (ref 70–99)

## 2012-07-25 LAB — PROTIME-INR
INR: 2.08 — ABNORMAL HIGH (ref 0.00–1.49)
Prothrombin Time: 22.7 seconds — ABNORMAL HIGH (ref 11.6–15.2)

## 2012-07-25 LAB — CBC
Platelets: 336 10*3/uL (ref 150–400)
RBC: 2.96 MIL/uL — ABNORMAL LOW (ref 3.87–5.11)
WBC: 13.1 10*3/uL — ABNORMAL HIGH (ref 4.0–10.5)

## 2012-07-25 LAB — HEPARIN LEVEL (UNFRACTIONATED): Heparin Unfractionated: <0.1 IU/mL — ABNORMAL LOW (ref 0.30–0.70)

## 2012-07-25 MED ORDER — ENOXAPARIN SODIUM 100 MG/ML ~~LOC~~ SOLN
100.0000 mg | Freq: Two times a day (BID) | SUBCUTANEOUS | Status: DC
  Administered 2012-07-25 – 2012-07-26 (×3): 100 mg via SUBCUTANEOUS
  Filled 2012-07-25 (×4): qty 1

## 2012-07-25 MED ORDER — WARFARIN SODIUM 5 MG PO TABS
5.0000 mg | ORAL_TABLET | Freq: Once | ORAL | Status: AC
Start: 2012-07-25 — End: 2012-07-25
  Administered 2012-07-25: 5 mg via ORAL
  Filled 2012-07-25: qty 1

## 2012-07-25 NOTE — Progress Notes (Signed)
ANTICOAGULATION CONSULT NOTE - Follow Up Consult  Pharmacy Consult:  Heparin + Warfarin (Overlap D#5/5) Indication: PE/DVT  No Known Allergies  Patient Measurements: Height: 5\' 8"  (172.7 cm) Weight: 212 lb 15.4 oz (96.6 kg) IBW/kg (Calculated) : 63.9 Heparin Dosing Weight: 83 kg  Vital Signs: Temp: 98.6 F (37 C) (07/23 0546) Temp src: Oral (07/22 2147) BP: 144/61 mmHg (07/23 0546) Pulse Rate: 97 (07/23 0546)  Labs:  Recent Labs  07/22/12 0745  07/23/12 0405  07/24/12 0550 07/24/12 1821 07/25/12 0520  HGB 8.0*  --  8.7*  --  8.2*  --  8.6*  HCT 24.9*  --  27.2*  --  25.7*  --  26.8*  PLT 340  --  347  --  340  --  336  LABPROT 15.9*  --  17.8*  --  20.8*  --   --   INR 1.30  --  1.51*  --  1.85*  --   --   HEPARINUNFRC 1.73*  < > 0.17*  < > <0.10* <0.10* <0.10*  < > = values in this interval not displayed.  Estimated Creatinine Clearance: 64.7 ml/min (by C-G formula based on Cr of 0.97).  Assessment: 29 YOF with history of DVT on Coumadin PTA.  Found to have new PE and DVT, s/p IVC filter placement.  Currently on day #6 overlap of IV heparin and Coumadin.  Heparin level undetectable despite aggressive rate increase.  INR is pending.  No complications with heparin infusion per RN and no bleeding noted.    Goal of Therapy:  INR 2-3 Heparin level 0.3-0.7 units/ml Monitor platelets by anticoagulation protocol: Yes   Plan:  - Increase heparin gtt to 1750 units/hr - Check 8 hr HL - Daily HL / CBC / PT / INR  Nadara Mustard, PharmD., MS Clinical Pharmacist Pager:  [phone] Thank you for allowing pharmacy to be part of this patients care team. 07/25/2012, 7:07 AM

## 2012-07-25 NOTE — Progress Notes (Signed)
Physical Therapy Evaluation Patient Details Name: Hannah [name] MRN: [id number] DOB: 27-Sep-1940 Today's Date: 07/25/2012 Time: 4098-1191 PT Time Calculation (min): 20 min  PT Assessment / Plan / Recommendation History of Present Illness  Pt admit fro PEA arrest, Massive PE, and bil LE DVTs.  IVC filter in place.    Clinical Impression  Pt improving with mobility, ambulated 125' with rollator, on RA and O2 sats 99%.     PT Assessment      Follow Up Recommendations  Home health PT;Supervision/Assistance - 24 hour    Does the patient have the potential to tolerate intense rehabilitation      Barriers to Discharge        Equipment Recommendations  None recommended by PT    Recommendations for Other Services     Frequency Min 3X/week    Precautions / Restrictions Precautions Precautions: Fall Restrictions Weight Bearing Restrictions: No   Pertinent Vitals/Pain VSS      Mobility  Bed Mobility Bed Mobility: Not assessed (up in chair for lunch) Rolling Left: 5: Supervision Left Sidelying to Sit: 5: Supervision;HOB elevated Sitting - Scoot to Edge of Bed: 5: Supervision Transfers Transfers: Sit to Stand;Stand to Sit Sit to Stand: 5: Supervision;4: Min guard;From chair/3-in-1;With upper extremity assist Stand to Sit: 5: Supervision;4: Min guard;To chair/3-in-1;With upper extremity assist Details for Transfer Assistance: practiced getting down and up from rollator and this was easier for pt than recliner, from recliner, needed min-guard A and use of momentum. Rollator seat higher and able to sit and stand with supervision. Pt transferred to and from rollator seat safely with good use of brakes. Also practiced using feet to move rollator around if she feels extremely tired or unsafe with ambulation Ambulation/Gait Ambulation/Gait Assistance: 5: Supervision Ambulation Distance (Feet): 125 Feet Assistive device: Rolling walker Ambulation/Gait Assistance Details: 1 seated rest  break after 50' and one standing rest break after 95'. Pt able to steer rollator safely. vc's for effective breathing. Gait Pattern: Decreased stride length;Step-through pattern;Trunk flexed Gait velocity: beginning to increase slightly Stairs: No Wheelchair Mobility Wheelchair Mobility: No    Exercises General Exercises - Lower Extremity Ankle Circles/Pumps: AROM;Both;10 reps;Seated Heel Slides: AROM;Both;5 reps;Seated Straight Leg Raises: AROM;Both;5 reps;Seated   PT Diagnosis:    PT Problem List:   PT Treatment Interventions:       PT Goals(Current goals can be found in the care plan section) Acute Rehab PT Goals Patient Stated Goal: to return home with family PT Goal Formulation: With patient Time For Goal Achievement: 07/26/12 Potential to Achieve Goals: Good  Visit Information  Last PT Received On: 07/25/12 Assistance Needed: +1 History of Present Illness: Pt admit fro PEA arrest, Massive PE, and bil LE DVTs.  IVC filter in place.         Prior Functioning       Cognition  Cognition Arousal/Alertness: Awake/alert Behavior During Therapy: WFL for tasks assessed/performed Overall Cognitive Status: Within Functional Limits for tasks assessed    Extremity/Trunk Assessment     Balance Balance Balance Assessed: Yes Static Standing Balance Static Standing - Balance Support: During functional activity Static Standing - Level of Assistance: 5: Stand by assistance Dynamic Standing Balance Dynamic Standing - Balance Support: Bilateral upper extremity supported;During functional activity Dynamic Standing - Level of Assistance: 5: Stand by assistance  End of Session PT - End of Session Equipment Utilized During Treatment: Gait belt Activity Tolerance: Patient tolerated treatment well Patient left: in chair;with call bell/phone within reach;with family/visitor present Nurse Communication:  Mobility status  GP   Lyanne Co, PT  Acute Rehab Services   [phone]   Lyanne Co 07/25/2012, 2:28 PM

## 2012-07-25 NOTE — Progress Notes (Signed)
Occupational Therapy Treatment Patient Details Name: Hannah [name] MRN: [id number] DOB: 1940-03-20 Today's Date: 07/25/2012 Time: 4098-1191 OT Time Calculation (min): 39 min  OT Assessment / Plan / Recommendation  History of present illness Pt admit fro PEA arrest, Massive PE, and bil LE DVTs.  IVC filter in place.     Clinical Impression Pt making progress and states that she is feeling much better since she has been admitted to the hospital. Pt's dtr present this a.m. and they plan for pt to d/c to dtr's home temporarily after acute care d/c   OT comments    Pt very pleasant and cooperative   Follow Up Recommendations  Home health OT;Supervision/Assistance - 24 hour    Barriers to Discharge   None, pt will be going to her dtr's home    Equipment Recommendations  3 in 1 bedside comode;Tub/shower bench    Recommendations for Other Services    Frequency Min 2X/week   Progress towards OT Goals Progress towards OT goals: Progressing toward goals  Plan Discharge plan remains appropriate    Precautions / Restrictions Precautions Precautions: Fall Restrictions Weight Bearing Restrictions: No   Pertinent Vitals/Pain No c/o pain    ADL  Grooming: Performed;Wash/dry hands;Wash/dry face;Supervision/safety;Min guard Where Assessed - Grooming: Unsupported standing Upper Body Bathing: Modified independent;Set up Lower Body Bathing: Simulated;Supervision/safety;Set up;Minimal assistance Lower Body Dressing: Performed;Minimal assistance Toilet Transfer: Performed;Supervision/safety;Min guard Toilet Transfer Method: Sit to stand Toilet Transfer Equipment: Regular height toilet;Grab bars Toileting - Clothing Manipulation and Hygiene: Min guard Where Assessed - Toileting Clothing Manipulation and Hygiene: Standing Transfers/Ambulation Related to ADLs: min guard assist with rollator from home. Pt able to stand at drawers and closet to retrieve ADL items, but reported " getting tired  easily " ADL Comments: Primarily impaired by decreased activity tolerance, on RA throughout. Pt required multiple rest breasks during functional activity. Pt's trs present and had maby questions about HH and equiment needs and use for home since pt will d/c to her home with her temporarily. Pt required increased time to complete ADL tasks due to multiple rest breaks from fatigue   OT Diagnosis:    OT Problem List:   OT Treatment Interventions:     OT Goals(current goals can now be found in the care plan section) Acute Rehab OT Goals Patient Stated Goal: to return home with family  Visit Information  Last OT Received On: 07/25/12 Assistance Needed: +1 History of Present Illness: Pt admit fro PEA arrest, Massive PE, and bil LE DVTs.  IVC filter in place.      Subjective Data      Prior Functioning       Cognition  Cognition Arousal/Alertness: Awake/alert Behavior During Therapy: WFL for tasks assessed/performed Overall Cognitive Status: Within Functional Limits for tasks assessed    Mobility  Bed Mobility Bed Mobility: Rolling Left;Left Sidelying to Sit;Sitting - Scoot to Edge of Bed Rolling Left: 5: Supervision Left Sidelying to Sit: 5: Supervision;HOB elevated Sitting - Scoot to Edge of Bed: 5: Supervision Transfers Transfers: Sit to Stand;Stand to Sit Sit to Stand: 4: Min guard;From bed;Without upper extremity assist;From toilet Stand to Sit: 4: Min guard;To chair/3-in-1;With armrests;Without upper extremity assist;To toilet Details for Transfer Assistance:  verbal cues for hand placement, required increased time to complete    Exercises      Balance Balance Balance Assessed: Yes Static Standing Balance Static Standing - Balance Support: During functional activity Static Standing - Level of Assistance: 5: Stand by assistance Dynamic Standing Balance  Dynamic Standing - Balance Support: No upper extremity supported;During functional activity Dynamic Standing - Level  of Assistance: 5: Stand by assistance;4: Min assist   End of Session OT - End of Session Activity Tolerance: Patient limited by fatigue;Patient tolerated treatment well Patient left: in chair;with call bell/phone within reach;with family/visitor present  GO     Galen Manila 07/25/2012, 12:41 PM

## 2012-07-25 NOTE — Progress Notes (Addendum)
  ANTICOAGULATION CONSULT NOTE - Follow Up  Pharmacy Consult for Lovenox and Coumadin  Indication: PE/DVT  No Known Allergies  Patient Measurements: Height: 5\' 8"  (172.7 cm) Weight: 212 lb 15.4 oz (96.6 kg) IBW/kg (Calculated) : 63.9  Vital Signs: Temp: 98.6 F (37 C) (07/23 0546) BP: 144/61 mmHg (07/23 0546) Pulse Rate: 97 (07/23 0546)  Labs:  Recent Labs  07/23/12 0405  07/24/12 0550 07/24/12 1821 07/25/12 0520  HGB 8.7*  --  8.2*  --  8.6*  HCT 27.2*  --  25.7*  --  26.8*  PLT 347  --  340  --  336  LABPROT 17.8*  --  20.8*  --  22.7*  INR 1.51*  --  1.85*  --  2.08*  HEPARINUNFRC 0.17*  < > <0.10* <0.10* <0.10*  < > = values in this interval not displayed.  Estimated Creatinine Clearance: 64.7 ml/min (by C-G formula based on Cr of 0.97).   Medical History: Past Medical History  Diagnosis Date  . Thyroid disease   . Diabetes mellitus without complication   . DVT (deep venous thrombosis)     Medications:  Scheduled:  . ipratropium  0.5 mg Nebulization Q6H   And  . albuterol  2.5 mg Nebulization Q6H  . budesonide  0.25 mg Nebulization BID  . feeding supplement  237 mL Oral BID BM  . insulin aspart  0-9 Units Subcutaneous TID WC  . levothyroxine  50 mcg Oral QAC breakfast  . magic mouthwash  5 mL Oral QID  . metoprolol tartrate  25 mg Oral BID  . sodium chloride  1,000 mL Intravenous Once  . Warfarin - Pharmacist Dosing Inpatient   Does not apply q1800   Infusions:    Assessment: 72 yo female being treated for massive PE/DVT.  Consulted to switch from heparin drip to lovenox treatment doses due to subtherapeutic heparin levels.  This is day 6 of warfarin + heparin.  PE occurred with therapeutic INR of 2.17 upon admission.  Current home coumadin dose is unknown, but has been receiving 5mg  daily, current INR is 2.08.  Per discussion with MD, will target new INR goal of 2.5-3.5 due to event while INR therapeutic at 2-3. Bridge therapy until two therapeutic  INRs are achieved 24h apart (patient has already been on therapy >5 days overlap).   Goal of Therapy:  INR 2.5-3.5 (per MD due to event while therapeutic at 2-3) Monitor platelets by anticoagulation protocol: Yes   Plan:  - discontinue heparin drip - start lovenox 100mg  q12h, first dose 1 hour after drip stopped - coumadin 5mg  x1 dose tonight - Daily INR's, and CBCs q72h while on lovenox - Monitor for signs and symptoms of bleeding  Harrold Donath E. Achilles Dunk, PharmD Clinical Pharmacist - Resident Pager: [phone] Pharmacy: [phone] 07/25/2012 10:17 AM

## 2012-07-25 NOTE — Progress Notes (Addendum)
Pt and daughter requesting hospital bed for discharge for patient.  Pt will be discharging to daughter, Boykin Peek home.  That address is:  [address], [place], [address].  Phone number is [phone].  Alternate number is [phone].

## 2012-07-25 NOTE — Progress Notes (Signed)
Patient evaluated for long-term disease management services with [facility] as a benefit of her [employer] insurance. Explained services to patient and consents were signed. Left [facility] Care Management packet with patient and contact information. Reports she plans to go home to stay with her daughter Stevphen [place] [phone] at discharge. Explained these services will no replace home health. Patient will receive a post discharge transition of care call and will be evaluated for monthly home visits for assessments and for education.      Raiford Noble, MSN-Ed, RN,BSN, [facility], [phone]

## 2012-07-25 NOTE — Progress Notes (Signed)
PULMONARY  / CRITICAL CARE MEDICINE  Name: Hannah [name] MRN: [id number] DOB: 12-07-1940    ADMISSION DATE:  07/13/2012 CONSULTATION DATE:  07/13/2012  REFERRING MD :  EDP - Nanavati PRIMARY SERVICE: PCCM  CHIEF COMPLAINT:  SOB, diaphoresis  BRIEF PATIENT DESCRIPTION: Hannah [name] is a 72 yo female with a PMH of HTN, HL, DM who presented to [facility] on 7/11 with a pea arrest ,massive PE.= s/p TNK  SIGNIFICANT EVENTS / STUDIES:  7/11 >>> Admitted with PE, PEA arrest, given TNK 7/11 >>> CT of Chest: central and obstructive bilateral pulmonary embolism with right heart strain  7/12 Echo >>> EF 60%, RV mod dilat, decreased systolic fxn, RA mild dilation , PAP -45  7/13 >>> Sig drop in Hbg on Hep , no obvious signs of bleeding /oozing from IV sites, rectal, oral, urine. Given 4u PRBC, protamine, and 1u FFP 7/13 CT Abd Pelvis >>> Intramuscular R thigh hematoma present 7/14 >>> IVC filter placement 7/14 extubated 7/15- nonocclusive rt leg dvt common fem, fem ,popliteal. Cannot exclude dvt involving left femoral vein  7/20> nurse reports BSs in the 60-80 range, eating fair, on Metform & Glyburide Bid=> Rec decr meds to Qam only for now; also note stridor, likely from intub & ETT- need assessment of cords this week 7/20 Transfer to med-surg 07/24/12: sTIL WITH mild stridor. Tough to stick for blood draws but she is reluctant about hjaving PICC line   LINES / TUBES: 7/11 ETT >> 7/14 7/11 R Fem CVC >> d/c'd 7/13 L ALine >> d/c'd 7/12 Foley >>> d/c'd  CULTURES: 7/11 UC >> no growth  ANTIBIOTICS: None  SUBJECTIVE:   07/25/12: Having to increase IV heparin gtt;; levels remain sub therapeutic . INR is 2.08. No stridor. No wheeze. Doing well. PT not seen patient   VITAL SIGNS: Temp:  [98.2 F (36.8 C)-98.6 F (37 C)] 98.6 F (37 C) (07/23 0546) Pulse Rate:  [85-101] 97 (07/23 0546) Resp:  [18-20] 18 (07/23 0546) BP: (136-152)/(61-73) 144/61 mmHg (07/23 0546) SpO2:  [96 %-98 %] 97  % (07/23 0546) HEMODYNAMICS:   VENTILATOR SETTINGS:   INTAKE / OUTPUT: Intake/Output     07/22 0701 - 07/23 0700 07/23 0701 - 07/24 0700   Urine (mL/kg/hr) 1950 (0.8)    Total Output 1950     Net -1950          Urine Occurrence 2 x    Stool Occurrence 2 x      PHYSICAL EXAMINATION: General:  NAD, coughing often, voice horse but no stridor Neuro: No focal deficits. Moves all 4s. Alert and Oriented HEENT: mild stridor Cardiovascular: RRR s M Lungs: no stridor , decreased rt base Abdomen:  Soft, nontender to palpation, +BS Musculoskeletal:  Extremities atraumatic, RLE swelling > LLE Skin:  No rashes noted but has extensive bruising on both UE  LABS:  PULMONARY No results found for this basename: PHART, PCO2, PCO2ART, PO2, PO2ART, HCO3, TCO2, O2SAT,  in the last 168 hours  CBC  Recent Labs Lab 07/23/12 0405 07/24/12 0550 07/25/12 0520  HGB 8.7* 8.2* 8.6*  HCT 27.2* 25.7* 26.8*  WBC 15.2* 11.6* 13.1*  PLT 347 340 336    COAGULATION  Recent Labs Lab 07/21/12 0805 07/22/12 0745 07/23/12 0405 07/24/12 0550 07/25/12 0520  PLT 333 340 347 340 336     Recent Labs Lab 07/21/12 0805 07/22/12 0745 07/23/12 0405 07/24/12 0550 07/25/12 0520  INR 1.03 1.30 1.51* 1.85* 2.08*    CARDIAC  No  results found for this basename: TROPONINI,  in the last 168 hours  No results found for this basename: PROBNP,  in the last 168 hours   CHEMISTRY   Recent Labs Lab 07/19/12 0020  NA 137  K 4.1  CL 103  CO2 26  GLUCOSE 155*  BUN 17  CREATININE 0.97  CALCIUM 8.7    INFECTIOUS   Recent Labs Lab 07/21/12 0805 07/22/12 0745 07/23/12 0405 07/24/12 0550 07/25/12 0520  WBC 13.7* 14.7* 15.2* 11.6* 13.1*   No results found for this basename: LATICACIDVEN, PROCALCITON,  in the last 168 hours   ENDOCRINE  CBG (last 3)   Recent Labs  07/24/12 1658 07/24/12 2209 07/25/12 0808  GLUCAP 156* 162* 116*      LIVER  Recent Labs Lab 07/21/12 0805  07/22/12 0745 07/23/12 0405 07/24/12 0550 07/25/12 0520  INR 1.03 1.30 1.51* 1.85* 2.08*      IMAGING x48h  - No results found.     ASSESSMENT / PLAN:  PULMONARY A: Bilateral pulmonary embolism, s/p IVC filter Acute Respiratory failure, resolved Mild stridor - likely post extubation laryngeal edema  07/24/12: stil lwith mild stridor.On IV heparin and coumadin. Heparin levels remain sub therapeutic despite increase  P:  Cont supplemental O2 BDs schedule but addd atrovent and pulmicort Tolerating heparin; but levels remai therapeuytic ; so check APLAb and change to lovenox (d/w pharm) Continue coumadin; Goal INR  >2.5 to 3.5 Saline lock Check, RF, ccp,DS DNA, APLAb  RENAL A: AKI, resolved Hypokalemia, resolved P:  kvo Recheck bmet 07/26/12  GASTROINTESTINAL A: Transaminitis, resolved P:  Cont CHO mod diet  HEMATOLOGIC A:  Bilateral Pulmonary Embolism s/p TNK 7/11  RLE DVT, Studies cannot r/o LLE DVT Acute blood loss anemia s/p 4 U PRBC, 1u FFP and protamine x 1 7/13  Thrombocytopenia, resolved R thigh hematoma, resolving P:  Needs age appropriate screening for malignancy - breast, colon Hypercoag w/u negative PRBC vfor hgb < 7gm% only   ENDOCRINE A: Hypothyroid  DM  Hypoglycemia, mild, recurrent P:  D/C oral agents - glyburide and metformin Cont sensitive scale on ACHS SSI Cont CHO mod diet   gENERAL Consult PT Dtr updated.  Use PIV and sticks for blood draw for now    Dr. Kalman Shan, M.D., [facility].C.P Pulmonary and Critical Care Medicine Staff Physician [facility] [place] Pulmonary and Critical Care Pager: [phone], If no answer or between  15:00h - 7:00h: call [phone]  07/25/2012 9:19 AM

## 2012-07-26 LAB — BASIC METABOLIC PANEL
BUN: 15 mg/dL (ref 6–23)
CO2: 26 mEq/L (ref 19–32)
Chloride: 103 mEq/L (ref 96–112)
Creatinine, Ser: 1 mg/dL (ref 0.50–1.10)
GFR calc Af Amer: 64 mL/min — ABNORMAL LOW (ref >90)
Potassium: 4.1 mEq/L (ref 3.5–5.1)

## 2012-07-26 LAB — CBC
Hemoglobin: 9.2 g/dL — ABNORMAL LOW (ref 12.0–15.0)
MCV: 91.9 fL (ref 78.0–100.0)
Platelets: 269 10*3/uL (ref 150–400)
RBC: 3.21 MIL/uL — ABNORMAL LOW (ref 3.87–5.11)
WBC: 12.6 10*3/uL — ABNORMAL HIGH (ref 4.0–10.5)

## 2012-07-26 LAB — PROTIME-INR: Prothrombin Time: 22.4 seconds — ABNORMAL HIGH (ref 11.6–15.2)

## 2012-07-26 MED ORDER — ENOXAPARIN SODIUM 100 MG/ML ~~LOC~~ SOLN: 100.0000 mg | Freq: Two times a day (BID) | SUBCUTANEOUS | Status: DC

## 2012-07-26 MED ORDER — WARFARIN SODIUM 5 MG PO TABS: 7.5000 mg | ORAL_TABLET | Freq: Once | ORAL | Status: DC

## 2012-07-26 MED ORDER — ALBUTEROL SULFATE (5 MG/ML) 0.5% IN NEBU: 2.5000 mg | INHALATION_SOLUTION | RESPIRATORY_TRACT | Status: DC | PRN

## 2012-07-26 MED ORDER — WARFARIN SODIUM 7.5 MG PO TABS
7.5000 mg | ORAL_TABLET | Freq: Once | ORAL | Status: AC
  Administered 2012-07-26: 7.5 mg via ORAL
  Filled 2012-07-26: qty 1

## 2012-07-26 MED ORDER — BUDESONIDE 0.25 MG/2ML IN SUSP: 0.2500 mg | Freq: Two times a day (BID) | RESPIRATORY_TRACT | Status: DC

## 2012-07-26 MED ORDER — IPRATROPIUM BROMIDE 0.02 % IN SOLN: 0.5000 mg | Freq: Four times a day (QID) | RESPIRATORY_TRACT | Status: DC

## 2012-07-26 MED ORDER — ALBUTEROL SULFATE (5 MG/ML) 0.5% IN NEBU: 2.5000 mg | INHALATION_SOLUTION | Freq: Four times a day (QID) | RESPIRATORY_TRACT | Status: DC

## 2012-07-26 NOTE — Progress Notes (Signed)
PULMONARY  / CRITICAL CARE MEDICINE  Name: Hannah [name] MRN: [id number] DOB: 1940/05/29    ADMISSION DATE:  07/13/2012 CONSULTATION DATE:  07/13/2012  REFERRING MD :  EDP - Nanavati PRIMARY SERVICE: PCCM  CHIEF COMPLAINT:  SOB, diaphoresis  BRIEF PATIENT DESCRIPTION: [name] is a 72 yo female with a PMH of HTN, HL, DM who presented to [facility] on 7/11 with a pea arrest ,massive PE.= s/p TNK  SIGNIFICANT EVENTS / STUDIES:  7/11 >>> Admitted with PE, PEA arrest, given TNK 7/11 >>> CT of Chest: central and obstructive bilateral pulmonary embolism with right heart strain  7/12 Echo >>> EF 60%, RV mod dilat, decreased systolic fxn, RA mild dilation , PAP -45  7/13 >>> Sig drop in Hbg on Hep , no obvious signs of bleeding /oozing from IV sites, rectal, oral, urine. Given 4u PRBC, protamine, and 1u FFP 7/13 CT Abd Pelvis >>> Intramuscular R thigh hematoma present 7/14 >>> IVC filter placement 7/14 extubated 7/15- nonocclusive rt leg dvt common fem, fem ,popliteal. Cannot exclude dvt involving left femoral vein  7/20> nurse reports BSs in the 60-80 range, eating fair, on Metform & Glyburide Bid=> Rec decr meds to Qam only for now; also note stridor, likely from intub & ETT- need assessment of cords this week 7/20 Transfer to med-surg 07/24/12: sTIL WITH mild stridor. Tough to stick for blood draws but she is reluctant about hjaving PICC line 40981 = changed to SQ lovenox  LINES / TUBES: 7/11 ETT >> 7/14 7/11 R Fem CVC >> d/c'd 7/13 L ALine >> d/c'd 7/12 Foley >>> d/c'd  CULTURES: 7/11 UC >> no growth  ANTIBIOTICS: None  SUBJECTIVE:    07/26/12  INR still 2.08. Now on sq lovernox Rx dose. She is not keen to go home to self administered lovenox and calling ino labs to do INR checks but is ok to it if professionally managed at home  By someone like a home care company  VITAL SIGNS: Temp:  [98.1 F (36.7 C)-98.7 F (37.1 C)] 98.6 F (37 C) (07/24 0454) Pulse Rate:  [91-106]  91 (07/24 0454) Resp:  [17-19] 18 (07/24 0454) BP: (132-136)/(52-87) 132/63 mmHg (07/24 0454) SpO2:  [95 %-100 %] 100 % (07/24 0454) HEMODYNAMICS:   VENTILATOR SETTINGS:   INTAKE / OUTPUT: Intake/Output     07/23 0701 - 07/24 0700 07/24 0701 - 07/25 0700   Urine (mL/kg/hr) 2250 (1)    Total Output 2250     Net -2250          Urine Occurrence  1 x     PHYSICAL EXAMINATION: General:  NAD, coughing often, voice horse but no stridor - hoarseness better per patient but to me is same Neuro: No focal deficits. Moves all 4s. Alert and Oriented HEENT: mild stridor Cardiovascular: RRR s M Lungs: no stridor , decreased rt base Abdomen:  Soft, nontender to palpation, +BS Musculoskeletal:  Extremities atraumatic, RLE swelling > LLE Skin:  No rashes noted but has extensive bruising on both UE  LABS:  PULMONARY No results found for this basename: PHART, PCO2, PCO2ART, PO2, PO2ART, HCO3, TCO2, O2SAT,  in the last 168 hours  CBC  Recent Labs Lab 07/24/12 0550 07/25/12 0520 07/26/12 0550  HGB 8.2* 8.6* 9.2*  HCT 25.7* 26.8* 29.5*  WBC 11.6* 13.1* 12.6*  PLT 340 336 269    COAGULATION  Recent Labs Lab 07/22/12 0745 07/23/12 0405 07/24/12 0550 07/25/12 0520 07/26/12 0550  PLT 340 347 340 336  269     Recent Labs Lab 07/22/12 0745 07/23/12 0405 07/24/12 0550 07/25/12 0520 07/26/12 0550  INR 1.30 1.51* 1.85* 2.08* 2.04*    CARDIAC  No results found for this basename: TROPONINI,  in the last 168 hours   Recent Labs Lab 07/26/12 0550  PROBNP 1306.0*     CHEMISTRY   Recent Labs Lab 07/26/12 0550  NA 138  K 4.1  CL 103  CO2 26  GLUCOSE 143*  BUN 15  CREATININE 1.00  CALCIUM 9.1  MG 1.9  PHOS 3.6    INFECTIOUS   Recent Labs Lab 07/22/12 0745 07/23/12 0405 07/24/12 0550 07/25/12 0520 07/26/12 0550  WBC 14.7* 15.2* 11.6* 13.1* 12.6*   No results found for this basename: LATICACIDVEN, PROCALCITON,  in the last 168  hours   ENDOCRINE  CBG (last 3)   Recent Labs  07/25/12 1706 07/25/12 2203 07/26/12 0738  GLUCAP 163* 143* 128*      LIVER  Recent Labs Lab 07/22/12 0745 07/23/12 0405 07/24/12 0550 07/25/12 0520 07/26/12 0550  INR 1.30 1.51* 1.85* 2.08* 2.04*      IMAGING x48h  - No results found.     ASSESSMENT / PLAN:  PULMONARY A: Bilateral pulmonary embolism, s/p IVC filter Acute Respiratory failure, resolved Mild stridor - likely post extubation laryngeal edema  07/26/12: stil lwith mild hoarseness but better subjectively. On Rx dose subcut lovneo. INR > 2 for 2 days but not at goal of > 2.5 to 3.5  P:  Cont supplemental O2 BDs schedule but addd atrovent and pulmicort Tolerating heparin; but levels remai therapeuytic ; so check APLAb and change to lovenox (d/w pharm) Continue coumadin; Goal INR  >2.5 to 3.5 Saline lock [place], RF, ccp,DS DNA, APLAb from 07/26/12  RENAL A: AKI, resolved Hypokalemia, resolved P:  kvo Recheck bmet 07/26/12  GASTROINTESTINAL A: Transaminitis, resolved P:  Cont CHO mod diet  HEMATOLOGIC A:  Bilateral Pulmonary Embolism s/p TNK 7/11  RLE DVT, Studies cannot r/o LLE DVT Acute blood loss anemia s/p 4 U PRBC, 1u FFP and protamine x 1 7/13  Thrombocytopenia, resolved R thigh hematoma, resolving P:  Needs age appropriate screening for malignancy - breast, colon Hypercoag w/u negative PRBC vfor hgb < 7gm% only   ENDOCRINE A: Hypothyroid  DM  Hypoglycemia, mild, recurrent P:  D/C oral agents - glyburide and metformin Cont sensitive scale on ACHS SSI Cont CHO mod diet   gENERAL 07/26/12: Patient updated. She is ok to go home with home lovenox if managed by home care company. Otherwise, stay here till INR > 2.5 and 5d heparin/lovenox completed. Home with hospital bed and home PT    Dr. Kalman Shan, M.D., [facility].C.P Pulmonary and Critical Care Medicine Staff Physician [facility] [place] Pulmonary and  Critical Care Pager: [phone], If no answer or between  15:00h - 7:00h: call [phone]  07/26/2012 9:44 AM

## 2012-07-26 NOTE — Discharge Summary (Signed)
Physician Discharge Summary     Patient ID: Hannah [name] MRN: [id number] DOB/AGE: 1940-08-23 72 y.o.  Admit date: 07/13/2012 Discharge date: 07/26/2012  Discharge Diagnoses:  Bilateral Pulmonary Embolism s/p IVC filter, S/p TNK 7/11 Confirmed RLE DVT Acute respiratory failure (resolved) Post-extubation stridor (resolved) Acute kidney injury (resolved) Transaminatis (resolved) Thrombocytopenia (resolved) Right thigh Hematoma Hypothyroidism  DM type II  Detailed Hospital Course:    72 yo female with a PMH of HTN, HL, DM who presented to [facility] on [facility]/11 with a chief complaint of feeling sick. Pt states that she was doing some house chores this morning, when she started feeling sick, with diaphoresis and nausea. She then began having SOB, chest palpitations, and passed out. She had no chest pains. EMS was called and when they arrived called a CODE STEMI and gave 4 baby ASA. On arrival to the [facility]. The patient has a hx of DVT, and is currently on coumadin. Initial troponin .14 but her EKG improved and Code STEMI was cancelled. The patient's SOB did not improve; however, so she was taken immediately to CT scan of the chest. CT scan showed central and obstructive bilateral pulmonary embolism with right heart strain. Once arriving back to the ER the patient PEA arrested, but pulses returned after 3 rounds of CPR. The patient was intubated and central venous access was obtained. PCCM was called for assistance with further management and admittance.   Given cardiac arrest, shock state, right heart strain Emergent TNK was administered in the emergency department and she was admitted to the intensive care on full ventilator support and required hemodynamic drips (vasopressors) to sustain BP. On 7/13 she had sudden drop in hgb down to 4. Heparin was stopped and Protamine administered as well as PRBCs transfused. CT abd/pelvis obtained as no other obvious source of bleeding. This showed intramuscular  hematoma on right thigh. Anticoagulation was held. IVC filter placed on 7/14. Pressors were weaned off. She was successfully extubated on 7/14. This was complicated by post-extubation stridor felt to be laryngeal edema. This resolved. After hbg was stabilized anticoagulation was resumed on 7/17 with IV heparin. Warfarin was started on 7/18 as bridge.  She has continued to slowly improve since this time. She was transitioned to LMWH on 7/23 and as of 7/24 she is ready for discharge to home on LMWH and coumadin overlap with goal INR 2.5-3.5. She has met her overlap of 5 days but has not yet had 2 consecutive INRs in therapeutic range. Because of this she will be sent home on LMWH and coumadin and followed closely in the out-pt setting.    Discharge Plan by diagnoses  Bilateral pulmonary embolism RLE DVT. Possible LLE DVT.  s/p IVC filter 7/14 Hypercoag w/u negative  Discharge Plan: Continue coumadin and LMWH bridge. Goal INR 2.5 to 3.5 for two consecutive days then d/c LMWH. Appointments have been made at the coumadin clinic.  Has f/u with vascular in 4 weeks at which time they will evaluate to have IVC removed.  F/u our office 1 week s/p d/c Needs age appropriate screening for malignancy - breast, colon Await, RF, ccp,DS DNA, APLAb from 07/26/12  Possible COPD Discharge plan Home on triple combo rx.   HTN Discharge plan Lopressor resumed. Was on Ace-I and HCTZ. Holding this for now. May need to resume depending on f/u Visit BP on 7/29  Hypothyroid  DM  Discharge plan Resume home rx  Cont CHO mod diet  Deconditioning Plan: Home PT/[facility]   Significant Hospital tests/ studies/ interventions and procedures  7/11 >>> Admitted with PE, PEA arrest, given TNK  7/11 >>> CT of Chest: central and obstructive bilateral pulmonary embolism with right heart strain  7/12 Echo >>> EF 60%, RV mod dilat, decreased systolic fxn, RA mild dilation , PAP -45  7/13 >>> Sig drop  in Hbg on Hep , no obvious signs of bleeding /oozing from IV sites, rectal, oral, urine. Given 4u PRBC, protamine, and 1u FFP  7/13 CT Abd Pelvis >>> Intramuscular R thigh hematoma present  7/14 >>> IVC filter placement  7/14 extubated  7/15- nonocclusive rt leg dvt common fem, fem ,popliteal. Cannot exclude dvt involving left femoral vein  7/20> nurse reports BSs in the 60-80 range, eating fair, on Metform & Glyburide Bid=> Rec decr meds to Qam only for now; also note stridor, likely from intub & ETT- need assessment of cords this week  7/20 Transfer to med-surg  07/24/12: sTIL WITH mild stridor. Tough to stick for blood draws but she is reluctant about hjaving PICC line  07/25/12 = changed to SQ lovenox   LINES / TUBES:  7/11 ETT >> 7/14  7/11 R Fem CVC >> d/c'd  7/13 L ALine >> d/c'd  7/12 Foley >>> d/c'd Consults  Discharge Exam: BP 151/70  Pulse 85  Temp(Src) 98.6 F (37 C) (Oral)  Resp 18  Ht 5\' 8"  (1.727 m)  Wt 96.6 kg (212 lb 15.4 oz)  BMI 32.39 kg/m2  SpO2 100%  General: NAD, coughing often, voice horse but no stridor - hoarseness better per patient but to me is same  Neuro: No focal deficits. Moves all 4s. Alert and Oriented  HEENT: mild stridor  Cardiovascular: RRR s M  Lungs: no stridor , decreased rt base  Abdomen: Soft, nontender to palpation, +BS  Musculoskeletal: Extremities atraumatic, RLE swelling > LLE  Skin: No rashes noted but has extensive bruising on both UE   Labs at discharge Lab Results  Component Value Date   CREATININE 1.00 07/26/2012   BUN 15 07/26/2012   NA 138 07/26/2012   K 4.1 07/26/2012   CL 103 07/26/2012   CO2 26 07/26/2012   Lab Results  Component Value Date   WBC 12.6* 07/26/2012   HGB 9.2* 07/26/2012   HCT 29.5* 07/26/2012   MCV 91.9 07/26/2012   PLT 269 07/26/2012   Lab Results  Component Value Date   ALT 12 07/17/2012   AST 14 07/17/2012   ALKPHOS 157* 07/17/2012   BILITOT 0.3 07/17/2012   Lab Results  Component Value Date    INR 2.04* 07/26/2012   INR 2.08* 07/25/2012   INR 1.85* 07/24/2012    Current radiology studies No results found.  Disposition:        Discharge Orders   Future Appointments Provider Department Dept Phone   07/27/2012 9:30 AM [facility] [phone]   07/30/2012 1:00 PM [facility] [phone]   07/31/2012 4:00 PM Julio Sicks, NP [facility] [phone]   08/16/2012 10:00 AM [facility] - [phone]   08/23/2012 9:00 AM [name], MD [facility] [phone]   Future Orders Complete By Expires     (HEART FAILURE PATIENTS) Call MD:  Anytime you have any of the following symptoms: 1) 3 pound weight gain in 24 hours or 5 pounds in 1 week 2) shortness of breath, with  or without a dry hacking cough 3) swelling in the hands, feet or stomach 4) if you have to sleep on extra pillows at night in order to breathe.  As directed     Call MD for:  extreme fatigue  As directed     Call MD for:  persistant dizziness or light-headedness  As directed     Diet - low sodium heart healthy  As directed     Increase activity slowly  As directed         Medication List    STOP taking these medications       aspirin 81 MG tablet     lisinopril-hydrochlorothiazide 20-25 MG per tablet  Commonly known as:  PRINZIDE,ZESTORETIC     OXYMETAZOLINE HCL (OPHTH) 0.025 % Soln     potassium chloride 10 MEQ tablet  Commonly known as:  K-DUR,KLOR-CON      TAKE these medications       albuterol (5 MG/ML) 0.5% nebulizer solution  Commonly known as:  PROVENTIL  Take 0.5 mLs (2.5 mg total) by nebulization every 3 (three) hours as needed for wheezing or shortness of breath.     albuterol (5 MG/ML) 0.5% nebulizer solution  Commonly known as:  PROVENTIL  Take 0.5 mLs (2.5 mg total) by nebulization every 6 (six) hours.      budesonide 0.25 MG/2ML nebulizer solution  Commonly known as:  PULMICORT  Take 2 mLs (0.25 mg total) by nebulization 2 (two) times daily.     cholecalciferol 1000 UNITS tablet  Commonly known as:  VITAMIN D  Take 1,000 Units by mouth.     enoxaparin 100 MG/ML injection  Commonly known as:  LOVENOX  Inject 1 mL (100 mg total) into the skin 2 (two) times daily.     glipiZIDE 5 MG tablet  Commonly known as:  GLUCOTROL  Take 5 mg by mouth 2 (two) times daily before a meal.     ipratropium 0.02 % nebulizer solution  Commonly known as:  ATROVENT  Take 2.5 mLs (0.5 mg total) by nebulization every 6 (six) hours.     levothyroxine 50 MCG tablet  Commonly known as:  SYNTHROID, LEVOTHROID  Take 50 mcg by mouth daily before breakfast.     metFORMIN 500 MG tablet  Commonly known as:  GLUCOPHAGE  Take 500 mg by mouth 2 (two) times daily with a meal.     metoprolol tartrate 25 MG tablet  Commonly known as:  LOPRESSOR  Take 25 mg by mouth 2 (two) times daily.     simvastatin 20 MG tablet  Commonly known as:  ZOCOR  Take 20 mg by mouth at bedtime.     trolamine salicylate 10 % cream  Commonly known as:  ASPERCREME  Apply topically.     vitamin C 500 MG tablet  Commonly known as:  ASCORBIC ACID  Take 500 mg by mouth.     warfarin 5 MG tablet  Commonly known as:  COUMADIN  Take 1.5 tablets (7.5 mg total) by mouth one time only at 6 PM.       Follow-up Information   Follow up with [name], MD In 4 weeks. (Office will call you to arrange your appt (sent))    Contact information:   [address] [place] [address] [phone]       Follow up with [facility] On 07/27/2012. (at 930am)    Contact information:   [address], [address] [place]  [address] [phone]      Follow up with [facility] On 07/30/2012. (at 1pm)    Contact information:   [address], [address] [place] [address] [phone]        Follow up with [facility], NP On 07/31/2012. (345pm )    Contact information:   [address] [place] [address] [phone]       Follow up with [facility], MD. Schedule an appointment as soon as possible for a visit in 2 weeks.   Contact information:   [address] [place] [address] [phone]       Discharged Condition: good ambulates on room air w/out desaturation   Physician Statement:   The Patient was personally examined, the discharge assessment and plan has been personally reviewed and I agree with ACNP Narmeen Kerper's assessment and plan. > 30 minutes of time have been dedicated to discharge assessment, planning and discharge instructions.   Signed: Nishant Schrecengost,PETE 07/26/2012, 11:51 AM

## 2012-07-26 NOTE — Progress Notes (Signed)
ANTICOAGULATION CONSULT NOTE - Follow Up Consult  Pharmacy Consult for warfarin Indication: pulmonary embolus and DVT   No Known Allergies  Patient Measurements: Height: 5\' 8"  (172.7 cm) Weight: 212 lb 15.4 oz (96.6 kg) IBW/kg (Calculated) : 63.9   Vital Signs: Temp: 98.6 F (37 C) (07/24 0454) BP: 151/70 mmHg (07/24 1040) Pulse Rate: 85 (07/24 1040)  Labs:  Recent Labs  07/24/12 0550 07/24/12 1821 07/25/12 0520 07/26/12 0550  HGB 8.2*  --  8.6* 9.2*  HCT 25.7*  --  26.8* 29.5*  PLT 340  --  336 269  LABPROT 20.8*  --  22.7* 22.4*  INR 1.85*  --  2.08* 2.04*  HEPARINUNFRC <0.10* <0.10* <0.10*  --   CREATININE  --   --   --  1.00    Estimated Creatinine Clearance: 62.7 ml/min (by C-G formula based on Cr of 1).   Medications:  Scheduled:  . ipratropium  0.5 mg Nebulization Q6H   And  . albuterol  2.5 mg Nebulization Q6H  . budesonide  0.25 mg Nebulization BID  . enoxaparin (LOVENOX) injection  100 mg Subcutaneous BID  . feeding supplement  237 mL Oral BID BM  . insulin aspart  0-9 Units Subcutaneous TID WC  . levothyroxine  50 mcg Oral QAC breakfast  . magic mouthwash  5 mL Oral QID  . metoprolol tartrate  25 mg Oral BID  . sodium chloride  1,000 mL Intravenous Once  . Warfarin - Pharmacist Dosing Inpatient   Does not apply q1800    Assessment: 24 YOF with history of DVT on Coumadin PTA. Found to have new PE and DVT, s/p IVC filter placement. Currently on day #7 overlap of anticoagulation (lovenox + Coumadin). INR still subtherapeutic at 2.04 (MD goal of 2.5-3.5)  Goal of Therapy:  Anti-Xa level 0.6-1.2 units/ml 4hrs after LMWH dose given and INR 2.5-3.5 Monitor platelets by anticoagulation protocol: Yes   Plan:  - continue lovenox 1mg /kg q12h - coumadin 7.5mg  x 1 dose tonight - monitor platelet count, renal function, H/H, SXS bleeding  Magdiel Bartles C. Ethen Bannan, PharmD Clinical Pharmacist-Resident Pager: [phone] Pharmacy: [phone] 07/26/2012 11:35  AM

## 2012-07-26 NOTE — Progress Notes (Signed)
Discharge instructions gone over with patient. Daughter is present and demonstrated how to give lovenox injection. Home medications gone over. Prescriptions given. [facility] delivered nebulizer machine to patient's room. They will also be assisting the patient at home with therapy and lab work. Reasons to call the doctor gone over. Diet, activity, and cardiac care gone over. Patient verbalized understanding of instructions.

## 2012-07-27 ENCOUNTER — Ambulatory Visit (INDEPENDENT_AMBULATORY_CARE_PROVIDER_SITE_OTHER): Payer: Medicare Other | Admitting: Cardiology

## 2012-07-27 ENCOUNTER — Telehealth: Payer: Self-pay

## 2012-07-27 DIAGNOSIS — I824Y9 Acute embolism and thrombosis of unspecified deep veins of unspecified proximal lower extremity: Secondary | ICD-10-CM

## 2012-07-27 DIAGNOSIS — I2699 Other pulmonary embolism without acute cor pulmonale: Secondary | ICD-10-CM

## 2012-07-27 DIAGNOSIS — Z7901 Long term (current) use of anticoagulants: Secondary | ICD-10-CM

## 2012-07-27 DIAGNOSIS — I824Y1 Acute embolism and thrombosis of unspecified deep veins of right proximal lower extremity: Secondary | ICD-10-CM

## 2012-07-27 LAB — CYCLIC CITRUL PEPTIDE ANTIBODY, IGG: Cyclic Citrullin Peptide Ab: <2 U/mL (ref 0.0–5.0)

## 2012-07-27 LAB — PROTIME-INR: INR: 4.9 — AB (ref <=1.1)

## 2012-07-27 NOTE — Telephone Encounter (Signed)
LM at [facility] giving them the dx code of 518.81.

## 2012-07-30 ENCOUNTER — Telehealth: Payer: Self-pay | Admitting: Pulmonary Disease

## 2012-07-30 LAB — POCT INR: INR: 4

## 2012-07-30 NOTE — Telephone Encounter (Signed)
[facility] nurse calling to report INR 4.0, was 4.9 2 ds ago HOLD coumadin tonight Will fwd to coumadin clinic to manage

## 2012-07-31 ENCOUNTER — Encounter: Payer: Self-pay | Admitting: Adult Health

## 2012-07-31 ENCOUNTER — Ambulatory Visit (INDEPENDENT_AMBULATORY_CARE_PROVIDER_SITE_OTHER)
Admission: RE | Admit: 2012-07-31 | Discharge: 2012-07-31 | Disposition: A | Discharge status: Home / Self Care | Payer: MEDICARE | Source: Ambulatory Visit | Attending: Adult Health | Admitting: Adult Health

## 2012-07-31 ENCOUNTER — Ambulatory Visit (INDEPENDENT_AMBULATORY_CARE_PROVIDER_SITE_OTHER): Payer: MEDICARE | Admitting: Adult Health

## 2012-07-31 ENCOUNTER — Ambulatory Visit (INDEPENDENT_AMBULATORY_CARE_PROVIDER_SITE_OTHER): Payer: Medicare Other | Admitting: Cardiology

## 2012-07-31 VITALS — BP 134/76 | HR 101 | Temp 97.0°F | Ht 68.0 in | Wt 199.4 lb

## 2012-07-31 DIAGNOSIS — Z7901 Long term (current) use of anticoagulants: Secondary | ICD-10-CM

## 2012-07-31 DIAGNOSIS — I2699 Other pulmonary embolism without acute cor pulmonale: Secondary | ICD-10-CM

## 2012-07-31 DIAGNOSIS — I824Y1 Acute embolism and thrombosis of unspecified deep veins of right proximal lower extremity: Secondary | ICD-10-CM

## 2012-07-31 DIAGNOSIS — J209 Acute bronchitis, unspecified: Secondary | ICD-10-CM

## 2012-07-31 DIAGNOSIS — I824Y9 Acute embolism and thrombosis of unspecified deep veins of unspecified proximal lower extremity: Secondary | ICD-10-CM

## 2012-07-31 MED ORDER — AMOXICILLIN-POT CLAVULANATE 875-125 MG PO TABS: 1.0000 | ORAL_TABLET | Freq: Two times a day (BID) | ORAL | Status: DC

## 2012-07-31 NOTE — Progress Notes (Signed)
[facility] calling to report INR 4.0, was 4.9 2 ds ago HOLD coumadin tonight Will fwd to coumadin clinic to manage 

## 2012-07-31 NOTE — Progress Notes (Signed)
Subjective:    Patient ID: Hannah [name], female    DOB: 04/07/1940, 72 y.o.   MRN: [id number]  HPI 72 year old female seen for initial pulmonary consult for bilateral pulmonary embolism. 07/13/2012  07/31/2012 post hospital followup Patient returns for a post hospital followup. Patient was admitted July 11 through July 24 for an extensive bilateral pulmonary embolism. Patient developed acute shortness of breath. Prior to admission. Patient presented to the emergency room and underwent a CT scan that showed a central and obstructive bilateral pulmonary was a with right heart strain. Patient did have personally have a PEA arrest in the emergency room requiring 3, rounds of CPR. Patient was given emergent TNK . Patient did experience cardiac and respiratory arrest. She required mechanical ventilation in the ICU.unfortunately patient experienced a second drop in her hemoglobin down to 4. Her heparin was stopped and protamine was administered. Patient did require a packed red blood cell transfusion. CT abdomen and pelvis was negative, except for an intramuscular hematoma on the right. Patient will underwent an IVC filter placement. Venous Doppler was positive for a right leg DVT Patient's hemoglobin improved, and patient was transitioned back over to anticoagulation. On July 17. With a bridge of Lovenox, and warfarin. Patient was discharged with Coumadin. Clinic, following her. Patient's last INR yesterday was 4.0. Her Coumadin was adjusted. Patient denies any known bleeding. She denies any hemoptysis, orthopnea, PND, or leg swelling. Has noticed over the last few days, and her cough has worsened and has been getting some brown, yellow and clear mucus up at times. She denies any fever.   Review of Systems Constitutional:   No  weight loss, night sweats,  Fevers, chills,  +fatigue, or  lassitude.  HEENT:   No headaches,  Difficulty swallowing,  Tooth/dental problems, or  Sore throat,                No  sneezing, itching, ear ache, nasal congestion, post nasal drip,   CV:  No chest pain,  Orthopnea, PND, swelling in lower extremities, anasarca, dizziness, palpitations, syncope.   GI  No heartburn, indigestion, abdominal pain, nausea, vomiting, diarrhea, change in bowel habits, loss of appetite, bloody stools.   Resp:   No chest wall deformity  Skin: no rash or lesions.  GU: no dysuria, change in color of urine, no urgency or frequency.  No flank pain, no hematuria   MS:  No joint pain or swelling.  No decreased range of motion.  No back pain.  Psych:  No change in mood or affect. No depression or anxiety.  No memory loss.         Objective:   Physical Exam GEN: A/Ox3; pleasant , NAD  HEENT:  [place]/AT,  EACs-clear, TMs-wnl, NOSE-clear, THROAT-clear, no lesions, no postnasal drip or exudate noted.   NECK:  Supple w/ fair ROM; no JVD; normal carotid impulses w/o bruits; no thyromegaly or nodules palpated; no lymphadenopathy.  RESP  Clear  P & A; w/o, wheezes/ rales/ or rhonchi.no accessory muscle use, no dullness to percussion  CARD:  RRR, no m/r/g  , tr peripheral edema, pulses intact, no cyanosis or clubbing.  GI:   Soft & nt; nml bowel sounds; no organomegaly or masses detected.  Musco: Warm bil, no deformities or joint swelling noted.   Neuro: alert, no focal deficits noted.    Skin: Warm, no lesions or rashes   CXR  07/31/2012  Partial atelectasis/collapse of the right lower lung with  associated right perihilar opacities,  atelectasis versus  infiltrate. A follow-up chest radiograph in 4 to 6 weeks after  treatment is recommended to ensure resolution.      Assessment & Plan:

## 2012-07-31 NOTE — Assessment & Plan Note (Signed)
Massive bilateral pulmonary embolism, complicated by PEA arrest, acute respiratory failure with mechanical ventilatory support. Patient is doing remarkably well. Her hospital stay was complicated by a significant drop in her hemoglobin down to 4 requiring packed blood cell transfusion. CT of the pelvis was negative, except for an intramuscular hematoma. Patient's hemoglobin stabilized. The patient was transitioned back over to anticoagulation. On July 17. Patient underwent an IVC filter that will be planned to be removed after vascular consult on August 14. Patient has been followed by the Coumadin. Clinic. She will follow back in office in 4 weeks and as needed. Her hypercoagulable panel was negative. Mammogram. Last year, was negative colonoscopy is unknown Patient will follow back in office in 4 weeks and as needed

## 2012-07-31 NOTE — Assessment & Plan Note (Signed)
Mild bronchitis. Chest x-ray shows a partial atelectasis in the right lower lobe To go ahead and treat patient with antibiotics-begin Augmentin twice daily for 7 days. Mucinex DM as needed. Patient will follow back up with Dr. Delton Coombes in 4 weeks. With a follow up. Chest x-ray. Patient is to alert, Coumadin. Clinic that she is begin antibiotic therapy

## 2012-07-31 NOTE — Patient Instructions (Signed)
Augmentin 875mg  Twice daily  For 7 days  Mucinex DM Twice daily  As needed  Cough/congestion  Continue to follow with coumadin clinic as directed Follow up with Dr. Darrick Penna next month to evaluate for your IVC filter  Follow up Dr. Delton Coombes  In 4 weeks with chest xray  Follow up with your family doctor for blood pressure and check to make sure colonoscopy is up to date.  Please contact office for sooner follow up if symptoms do not improve or worsen or seek emergency care

## 2012-08-05 ENCOUNTER — Emergency Department (HOSPITAL_COMMUNITY): Payer: Medicare Other

## 2012-08-05 ENCOUNTER — Inpatient Hospital Stay (HOSPITAL_COMMUNITY)
Admission: EM | Admit: 2012-08-05 | Discharge: 2012-08-08 | DRG: 175 | Disposition: A | Discharge status: Home / Self Care | Payer: Medicare Other | Attending: Internal Medicine | Admitting: Internal Medicine

## 2012-08-05 ENCOUNTER — Encounter (HOSPITAL_COMMUNITY): Payer: Self-pay | Admitting: Emergency Medicine

## 2012-08-05 DIAGNOSIS — E039 Hypothyroidism, unspecified: Secondary | ICD-10-CM | POA: Diagnosis present

## 2012-08-05 DIAGNOSIS — Z86718 Personal history of other venous thrombosis and embolism: Secondary | ICD-10-CM

## 2012-08-05 DIAGNOSIS — J209 Acute bronchitis, unspecified: Secondary | ICD-10-CM

## 2012-08-05 DIAGNOSIS — R0600 Dyspnea, unspecified: Secondary | ICD-10-CM

## 2012-08-05 DIAGNOSIS — N390 Urinary tract infection, site not specified: Secondary | ICD-10-CM | POA: Diagnosis present

## 2012-08-05 DIAGNOSIS — K219 Gastro-esophageal reflux disease without esophagitis: Secondary | ICD-10-CM | POA: Diagnosis present

## 2012-08-05 DIAGNOSIS — N179 Acute kidney failure, unspecified: Secondary | ICD-10-CM | POA: Diagnosis present

## 2012-08-05 DIAGNOSIS — I82509 Chronic embolism and thrombosis of unspecified deep veins of unspecified lower extremity: Secondary | ICD-10-CM | POA: Diagnosis present

## 2012-08-05 DIAGNOSIS — E162 Hypoglycemia, unspecified: Secondary | ICD-10-CM

## 2012-08-05 DIAGNOSIS — I824Y9 Acute embolism and thrombosis of unspecified deep veins of unspecified proximal lower extremity: Secondary | ICD-10-CM | POA: Diagnosis present

## 2012-08-05 DIAGNOSIS — R0609 Other forms of dyspnea: Secondary | ICD-10-CM

## 2012-08-05 DIAGNOSIS — I2699 Other pulmonary embolism without acute cor pulmonale: Secondary | ICD-10-CM

## 2012-08-05 DIAGNOSIS — R578 Other shock: Secondary | ICD-10-CM

## 2012-08-05 DIAGNOSIS — J9819 Other pulmonary collapse: Secondary | ICD-10-CM | POA: Diagnosis present

## 2012-08-05 DIAGNOSIS — R0902 Hypoxemia: Secondary | ICD-10-CM | POA: Diagnosis present

## 2012-08-05 DIAGNOSIS — J9601 Acute respiratory failure with hypoxia: Secondary | ICD-10-CM

## 2012-08-05 DIAGNOSIS — I10 Essential (primary) hypertension: Secondary | ICD-10-CM | POA: Diagnosis present

## 2012-08-05 DIAGNOSIS — E119 Type 2 diabetes mellitus without complications: Secondary | ICD-10-CM | POA: Diagnosis present

## 2012-08-05 DIAGNOSIS — Z8674 Personal history of sudden cardiac arrest: Secondary | ICD-10-CM

## 2012-08-05 DIAGNOSIS — E785 Hyperlipidemia, unspecified: Secondary | ICD-10-CM | POA: Diagnosis present

## 2012-08-05 DIAGNOSIS — Z7901 Long term (current) use of anticoagulants: Secondary | ICD-10-CM

## 2012-08-05 DIAGNOSIS — Z87440 Personal history of urinary (tract) infections: Secondary | ICD-10-CM

## 2012-08-05 DIAGNOSIS — R570 Cardiogenic shock: Secondary | ICD-10-CM

## 2012-08-05 DIAGNOSIS — E872 Acidosis: Secondary | ICD-10-CM

## 2012-08-05 DIAGNOSIS — J9600 Acute respiratory failure, unspecified whether with hypoxia or hypercapnia: Secondary | ICD-10-CM

## 2012-08-05 DIAGNOSIS — D649 Anemia, unspecified: Secondary | ICD-10-CM | POA: Diagnosis present

## 2012-08-05 DIAGNOSIS — Z87891 Personal history of nicotine dependence: Secondary | ICD-10-CM

## 2012-08-05 LAB — POCT I-STAT 3, ART BLOOD GAS (G3+)
Bicarbonate: 21 mEq/L (ref 20.0–24.0)
pCO2 arterial: 31.1 mmHg — ABNORMAL LOW (ref 35.0–45.0)
pH, Arterial: 7.439 (ref 7.350–7.450)
pO2, Arterial: 70 mmHg — ABNORMAL LOW (ref 80.0–100.0)

## 2012-08-05 LAB — BASIC METABOLIC PANEL
Chloride: 103 mEq/L (ref 96–112)
GFR calc Af Amer: 71 mL/min — ABNORMAL LOW (ref >90)
Potassium: 3.9 mEq/L (ref 3.5–5.1)

## 2012-08-05 LAB — GLUCOSE, CAPILLARY
Glucose-Capillary: 171 mg/dL — ABNORMAL HIGH (ref 70–99)
Glucose-Capillary: 177 mg/dL — ABNORMAL HIGH (ref 70–99)

## 2012-08-05 LAB — URINALYSIS W MICROSCOPIC + REFLEX CULTURE
Bilirubin Urine: NEGATIVE
Hgb urine dipstick: NEGATIVE
Ketones, ur: NEGATIVE mg/dL
Nitrite: NEGATIVE
Urobilinogen, UA: 0.2 mg/dL (ref 0.0–1.0)

## 2012-08-05 LAB — CBC
HCT: 34.4 % — ABNORMAL LOW (ref 36.0–46.0)
Hemoglobin: 11.3 g/dL — ABNORMAL LOW (ref 12.0–15.0)
RBC: 3.74 MIL/uL — ABNORMAL LOW (ref 3.87–5.11)
WBC: 15.6 10*3/uL — ABNORMAL HIGH (ref 4.0–10.5)

## 2012-08-05 LAB — POCT I-STAT TROPONIN I: Troponin i, poc: 0 ng/mL (ref 0.00–0.08)

## 2012-08-05 LAB — TROPONIN I: Troponin I: <0.3 ng/mL (ref <0.30)

## 2012-08-05 LAB — HEMOGLOBIN A1C
Hgb A1c MFr Bld: 5.3 % (ref <5.7)
Mean Plasma Glucose: 105 mg/dL (ref <117)

## 2012-08-05 LAB — PROTIME-INR: INR: 2.45 — ABNORMAL HIGH (ref 0.00–1.49)

## 2012-08-05 MED ORDER — INSULIN ASPART 100 UNIT/ML ~~LOC~~ SOLN: 0.0000 [IU] | Freq: Every day | SUBCUTANEOUS | Status: DC

## 2012-08-05 MED ORDER — SIMVASTATIN 20 MG PO TABS
20.0000 mg | ORAL_TABLET | Freq: Every day | ORAL | Status: DC
  Administered 2012-08-05 – 2012-08-07 (×3): 20 mg via ORAL
  Filled 2012-08-05 (×5): qty 1

## 2012-08-05 MED ORDER — WARFARIN - PHARMACIST DOSING INPATIENT
Freq: Every day | Status: DC
  Administered 2012-08-05 – 2012-08-07 (×2)

## 2012-08-05 MED ORDER — IOHEXOL 350 MG/ML SOLN
60.0000 mL | Freq: Once | INTRAVENOUS | Status: AC | PRN
  Administered 2012-08-05: 60 mL via INTRAVENOUS

## 2012-08-05 MED ORDER — ALBUTEROL SULFATE (5 MG/ML) 0.5% IN NEBU: 2.5000 mg | INHALATION_SOLUTION | RESPIRATORY_TRACT | Status: DC | PRN

## 2012-08-05 MED ORDER — LEVOTHYROXINE SODIUM 50 MCG PO TABS
50.0000 ug | ORAL_TABLET | Freq: Every day | ORAL | Status: DC
  Administered 2012-08-06 – 2012-08-08 (×3): 50 ug via ORAL
  Filled 2012-08-05 (×5): qty 1

## 2012-08-05 MED ORDER — IPRATROPIUM BROMIDE 0.02 % IN SOLN
0.5000 mg | Freq: Four times a day (QID) | RESPIRATORY_TRACT | Status: DC
  Administered 2012-08-05 – 2012-08-07 (×10): 0.5 mg via RESPIRATORY_TRACT
  Filled 2012-08-05 (×10): qty 2.5

## 2012-08-05 MED ORDER — ONDANSETRON HCL 4 MG PO TABS: 4.0000 mg | ORAL_TABLET | Freq: Four times a day (QID) | ORAL | Status: DC | PRN

## 2012-08-05 MED ORDER — ONDANSETRON HCL 4 MG/2ML IJ SOLN: 4.0000 mg | Freq: Four times a day (QID) | INTRAMUSCULAR | Status: DC | PRN

## 2012-08-05 MED ORDER — VITAMIN D3 25 MCG (1000 UNIT) PO TABS
1000.0000 [IU] | ORAL_TABLET | Freq: Every day | ORAL | Status: DC
Start: 2012-08-05 — End: 2012-08-08
  Administered 2012-08-05 – 2012-08-08 (×4): 1000 [IU] via ORAL
  Filled 2012-08-05 (×4): qty 1

## 2012-08-05 MED ORDER — PANTOPRAZOLE SODIUM 40 MG PO TBEC
40.0000 mg | DELAYED_RELEASE_TABLET | Freq: Two times a day (BID) | ORAL | Status: DC
  Administered 2012-08-06 – 2012-08-08 (×5): 40 mg via ORAL
  Filled 2012-08-05 (×5): qty 1

## 2012-08-05 MED ORDER — FLUCONAZOLE 100 MG PO TABS
100.0000 mg | ORAL_TABLET | Freq: Every day | ORAL | Status: DC
  Administered 2012-08-05 – 2012-08-06 (×2): 100 mg via ORAL
  Filled 2012-08-05 (×2): qty 1

## 2012-08-05 MED ORDER — METOPROLOL TARTRATE 25 MG PO TABS
25.0000 mg | ORAL_TABLET | Freq: Two times a day (BID) | ORAL | Status: DC
  Administered 2012-08-05 – 2012-08-08 (×7): 25 mg via ORAL
  Filled 2012-08-05 (×9): qty 1

## 2012-08-05 MED ORDER — GLIPIZIDE 5 MG PO TABS
5.0000 mg | ORAL_TABLET | Freq: Two times a day (BID) | ORAL | Status: DC
  Administered 2012-08-05 – 2012-08-07 (×3): 5 mg via ORAL
  Filled 2012-08-05 (×7): qty 1

## 2012-08-05 MED ORDER — ENOXAPARIN SODIUM 100 MG/ML ~~LOC~~ SOLN: 100.0000 mg | Freq: Two times a day (BID) | SUBCUTANEOUS | Status: DC

## 2012-08-05 MED ORDER — IPRATROPIUM BROMIDE 0.02 % IN SOLN: 0.5000 mg | Freq: Four times a day (QID) | RESPIRATORY_TRACT | Status: DC | PRN

## 2012-08-05 MED ORDER — POLYETHYLENE GLYCOL 3350 17 G PO PACK
17.0000 g | PACK | Freq: Every day | ORAL | Status: DC | PRN
  Filled 2012-08-05: qty 1

## 2012-08-05 MED ORDER — INSULIN ASPART 100 UNIT/ML ~~LOC~~ SOLN
0.0000 [IU] | Freq: Three times a day (TID) | SUBCUTANEOUS | Status: DC
  Administered 2012-08-05 – 2012-08-06 (×2): 2 [IU] via SUBCUTANEOUS
  Administered 2012-08-06: 3 [IU] via SUBCUTANEOUS

## 2012-08-05 MED ORDER — WARFARIN SODIUM 5 MG PO TABS
5.0000 mg | ORAL_TABLET | Freq: Once | ORAL | Status: AC
  Administered 2012-08-05: 5 mg via ORAL
  Filled 2012-08-05: qty 1

## 2012-08-05 MED ORDER — SODIUM CHLORIDE 0.9 % IV SOLN
3.0000 g | Freq: Four times a day (QID) | INTRAVENOUS | Status: DC
  Administered 2012-08-05 – 2012-08-07 (×7): 3 g via INTRAVENOUS
  Filled 2012-08-05 (×12): qty 3

## 2012-08-05 MED ORDER — BUDESONIDE 0.25 MG/2ML IN SUSP
0.2500 mg | Freq: Two times a day (BID) | RESPIRATORY_TRACT | Status: DC
  Administered 2012-08-05 – 2012-08-08 (×6): 0.25 mg via RESPIRATORY_TRACT
  Filled 2012-08-05 (×9): qty 2

## 2012-08-05 MED ORDER — SODIUM CHLORIDE 0.9 % IJ SOLN
3.0000 mL | Freq: Two times a day (BID) | INTRAMUSCULAR | Status: DC
  Administered 2012-08-05 – 2012-08-07 (×5): 3 mL via INTRAVENOUS

## 2012-08-05 MED ORDER — HYDROCODONE-ACETAMINOPHEN 5-325 MG PO TABS: 1.0000 | ORAL_TABLET | ORAL | Status: DC | PRN

## 2012-08-05 NOTE — ED Notes (Signed)
IV team at the bedside attempting to gain peripheral access.

## 2012-08-05 NOTE — ED Notes (Signed)
Pt has returned from being out of the department; pt placed back on monitor, continuous pulse oximetry and blood pressure cuff; family at bedside 

## 2012-08-05 NOTE — ED Notes (Signed)
IV team paged in order to obtain IV access.

## 2012-08-05 NOTE — Progress Notes (Signed)
ANTICOAGULATION CONSULT NOTE - Initial Consult  Pharmacy Consult for Warfarin Indication: DVT/PE   Allergies  Allergen Reactions  . Augmentin (Amoxicillin-Pot Clavulanate) Other (See Comments)    Severe vaginal itching    Patient Measurements: Height: 5\' 8"  (172.7 cm) Weight: 196 lb 6.9 oz (89.1 kg) IBW/kg (Calculated) : 63.9   Vital Signs: Temp: 98.2 F (36.8 C) (08/03 1225) Temp src: Oral (08/03 1225) BP: 128/62 mmHg (08/03 1225) Pulse Rate: 94 (08/03 1225)  Labs:  Recent Labs  08/05/12 0635  HGB 11.3*  HCT 34.4*  PLT 421*  LABPROT 25.8*  INR 2.45*  CREATININE 0.92    Estimated Creatinine Clearance: 65.5 ml/min (by C-G formula based on Cr of 0.92).   Medical History: Past Medical History  Diagnosis Date  . Thyroid disease   . Diabetes mellitus without complication   . DVT (deep venous thrombosis)     Medications:  Prescriptions prior to admission  Medication Sig Dispense Refill  . albuterol (PROVENTIL) (5 MG/ML) 0.5% nebulizer solution Take 0.5 mLs (2.5 mg total) by nebulization every 3 (three) hours as needed for wheezing or shortness of breath.  20 mL  12  . amoxicillin-clavulanate (AUGMENTIN) 875-125 MG per tablet Take 1 tablet by mouth 2 (two) times daily.      . budesonide (PULMICORT) 0.25 MG/2ML nebulizer solution Take 2 mLs (0.25 mg total) by nebulization 2 (two) times daily.  60 mL  12  . cholecalciferol (VITAMIN D) 1000 UNITS tablet Take 1,000 Units by mouth daily.       Marland Kitchen enoxaparin (LOVENOX) 100 MG/ML injection Inject 100 mg into the skin 2 (two) times daily.      Marland Kitchen glipiZIDE (GLUCOTROL) 5 MG tablet Take 5 mg by mouth 2 (two) times daily before a meal.      . ipratropium (ATROVENT) 0.02 % nebulizer solution Take 0.5 mg by nebulization every 6 (six) hours as needed (for shortness of breath).      Marland Kitchen levothyroxine (SYNTHROID, LEVOTHROID) 50 MCG tablet Take 50 mcg by mouth daily before breakfast.      . metFORMIN (GLUCOPHAGE) 500 MG tablet Take 500  mg by mouth 2 (two) times daily with a meal.      . metoprolol tartrate (LOPRESSOR) 25 MG tablet Take 25 mg by mouth 2 (two) times daily.      . simvastatin (ZOCOR) 20 MG tablet Take 20 mg by mouth at bedtime.      . trolamine salicylate (ASPERCREME) 10 % cream Apply 1 application topically daily as needed (for arthritis).       . vitamin C (ASCORBIC ACID) 500 MG tablet Take 500 mg by mouth daily.       Marland Kitchen warfarin (COUMADIN) 5 MG tablet Take 7.5 mg by mouth every evening. Take 1.5 tablets       Assessment: 72 yo F admitted for shortness of breath. Pt was recently admitted with massive PE causing cardiac arrest requiring CPR, full anticoag, and IVC filter placement. On coumadin PTA with therapeutic INR (2.45)  Home coumadin: 7.5 mg daily, last dose yesterday. Patient unclear about her home dose now with clinic adjustments. Still trying to reach patient's daughter who knows patient's medications per patient's request. During recent stay (discharged 7/24), patient received 5 mg daily, and 7.5 mg on her last day. Recent supratherapeutic INR in clinic  Goal of Therapy:  INR Goal in last admission: 2.5-3.5 (due to PE while on coumadin) Monitor platelets by anticoagulation protocol: Yes   Plan:  -Coumadin 5  mg PO x 1 dose -Daily INR -Monitor SXS bleeding, H/H  Latravis Grine C. Gid Schoffstall, PharmD Clinical Pharmacist-Resident Pager: [phone] Pharmacy: [phone] 08/05/2012 12:56 PM

## 2012-08-05 NOTE — ED Notes (Signed)
Pt up to ambulate with assistance. Remained 97% on RA while walking, states she is SOB on exertion.

## 2012-08-05 NOTE — ED Provider Notes (Signed)
Patient signed out to me with shortness of breath pending CT PE. She was recently discharged after a massive PE and PEA arrest. Her INR is therapeutic. She is an IVC filter in place.  CT scan shows improvement clot burden without acute new PE or infiltrate. Patient become short of breath with exertion but does not desaturate maintain saturations to 97%. Discussed with Dr. Craige Cotta of critical care who feels observation by the hospitalist is warranted given her previous near-death episode.  D/w Dr. Bess Harvest who will admit. Dr. Craige Cotta agrees to consult.  BP 101/36  Pulse 88  Temp(Src) 97.9 F (36.6 C) (Oral)  Resp 17  SpO2 100%   Glynn Octave, MD 08/05/12 1156

## 2012-08-05 NOTE — ED Notes (Signed)
Pt resting quietly at the time. Vital signs stable. Respirations unlabored. Family at bedside. Pt remains on cardiac monitor. No signs of acute distress noted.

## 2012-08-05 NOTE — Consult Note (Addendum)
PULMONARY  / CRITICAL CARE MEDICINE  Name: Hannah [name] MRN: [id number] DOB: [birth year]    ADMISSION DATE:  08/05/2012 CONSULTATION DATE:  08/05/12  REFERRING MD :  [facility] PRIMARY SERVICE: [facility]  CHIEF COMPLAINT: sob  BRIEF PATIENT DESCRIPTION:   29 yobf admitted 7/11 with massive PE in setting of chronic dvt by HPI was on coumadin with inr  2.17 (though pt denies on this admit that she had been on it for over a year) complicated by pea arrest and acute blood loss requiring filter and d/c 7/24 with improving doe and minimal cough then abruptly am 8/3 recurrent resting sob > to er where readmit by [facility] and PCCM service asked to evaluatie  SIGNIFICANT EVENTS / STUDIES: 7/11 >>> Admitted with PE, PEA arrest, given thrombolytics 7/11 >>> CT of Chest: central and obstructive bilateral pulmonary embolism with right heart strain  7/12 Echo >>> EF 60%, RV mod dilat, decreased systolic fxn, RA mild dilation , PAP -45  7/13 >>> Sig drop in Hbg on Hep , no obvious signs of bleeding /oozing from IV sites, rectal, oral, urine. Given 4u PRBC, protamine, and 1u FFP  7/13 CT Abd Pelvis >>> Intramuscular R thigh hematoma present  7/14 >>> IVC filter placement  7/14 extubated  7/15-- Findings consistent with deep vein thrombosis involving the right common femoral vein, right femoral vein, and right popliteal vein. - Cannot exclude deep vein thrombosis involving the left femoral vein.  7/20> nurse reports BSs in the 60-80 range, eating fair, on Metform & Glyburide Bid=> Rec decr meds to Qam only for now; also note stridor, likely from intub & ETT- need assessment of cords this week  7/20 Transfer to med-surg  07/24/12: sTIL WITH mild stridor. Tough to stick for blood draws but she is reluctant about hjaving PICC line  07/25/12 = changed to SQ lovenox         HISTORY OF PRESENT ILLNESS:   72 yobf remote smoker with  diabetes mellitus, hypothyroidism, DVT in the past,  admitted  7/11/4 massive PE causing  T. a cardiac arrest requiring CPR, full anticoagulation and IVC filter placement, she was   Discharged 7/24  on full anticoagulation and currently was on Coumadin with therapeutic INR comes back after she started experiencing some shortness of breath this morning, she says that she was unable to catch her breath, she denies any chest pain or palpitations, she does have a cough which actually is getting better (had been on ACEi but had been asked to stop taking p admit)  no fever chills, in the ER she was found to have mild hypoxia upon ambulation, she was still 90% on room air at rest, her EKG and lab work were unremarkable except for mild leukocytosis, her INR was therapeutic, better p being placed on 02 and given breathing treatment  No obvious daytime variabilty or assoc  cp or chest tightness, subjective wheeze overt sinus or hb symptoms. No unusual exp hx or h/o childhood pna/ asthma or knowledge of premature birth.    . Also denies any obvious fluctuation of symptoms with weather or environmental changes or other aggravating or alleviating factors except as outlined above     PAST MEDICAL HISTORY :  Past Medical History  Diagnosis Date  . Thyroid disease   . Diabetes mellitus without complication   . DVT (deep venous thrombosis)    History reviewed. No pertinent past surgical history. Prior to Admission medications   Medication Sig Start Date End Date  Taking? Authorizing Provider  albuterol (PROVENTIL) (5 MG/ML) 0.5% nebulizer solution Take 0.5 mLs (2.5 mg total) by nebulization every 3 (three) hours as needed for wheezing or shortness of breath. 07/26/12  Yes Simonne Martinet, NP  amoxicillin-clavulanate (AUGMENTIN) 875-125 MG per tablet Take 1 tablet by mouth 2 (two) times daily. 07/31/12 08/07/12 Yes Tammy S Parrett, NP  budesonide (PULMICORT) 0.25 MG/2ML nebulizer solution Take 2 mLs (0.25 mg total) by nebulization 2 (two) times daily. 07/26/12  Yes Simonne Martinet, NP  cholecalciferol  (VITAMIN D) 1000 UNITS tablet Take 1,000 Units by mouth daily.    Yes Historical Provider, MD  enoxaparin (LOVENOX) 100 MG/ML injection Inject 100 mg into the skin 2 (two) times daily. 07/26/12  Yes Simonne Martinet, NP  glipiZIDE (GLUCOTROL) 5 MG tablet Take 5 mg by mouth 2 (two) times daily before a meal.   Yes Historical Provider, MD  ipratropium (ATROVENT) 0.02 % nebulizer solution Take 0.5 mg by nebulization every 6 (six) hours as needed (for shortness of breath). 07/26/12  Yes Simonne Martinet, NP  levothyroxine (SYNTHROID, LEVOTHROID) 50 MCG tablet Take 50 mcg by mouth daily before breakfast.   Yes Historical Provider, MD  metFORMIN (GLUCOPHAGE) 500 MG tablet Take 500 mg by mouth 2 (two) times daily with a meal.   Yes Historical Provider, MD  metoprolol tartrate (LOPRESSOR) 25 MG tablet Take 25 mg by mouth 2 (two) times daily.   Yes Historical Provider, MD  simvastatin (ZOCOR) 20 MG tablet Take 20 mg by mouth at bedtime.   Yes Historical Provider, MD  trolamine salicylate (ASPERCREME) 10 % cream Apply 1 application topically daily as needed (for arthritis).    Yes Historical Provider, MD  vitamin C (ASCORBIC ACID) 500 MG tablet Take 500 mg by mouth daily.    Yes Historical Provider, MD  warfarin (COUMADIN) 5 MG tablet Take 7.5 mg by mouth every evening. Take 1.5 tablets 07/26/12  Yes Simonne Martinet, NP   Allergies  Allergen Reactions  . Augmentin (Amoxicillin-Pot Clavulanate) Other (See Comments)    Severe vaginal itching    FAMILY HISTORY:  No family history on file. SOCIAL HISTORY:  reports that she quit smoking about 44 years ago. Her smoking use included Cigarettes. She has a 10 pack-year smoking history. She does not have any smokeless tobacco history on file. She reports that she does not drink alcohol or use illicit drugs.  REVIEW OF SYSTEMS:   Taken in detail and neg x as above  SUBJECTIVE:  Comfortable at 30 degrees  supine  VITAL SIGNS: Temp:  [97.9 F (36.6 C)-98.2 F  (36.8 C)] 98.2 F (36.8 C) (08/03 1225) Pulse Rate:  [88-104] 94 (08/03 1225) Resp:  [17-27] 24 (08/03 1225) BP: (97-134)/(36-119) 128/62 mmHg (08/03 1225) SpO2:  [98 %-100 %] 100 % (08/03 1419) FiO2 (%):  [24 %] 24 % (08/03 1419) Weight:  [196 lb 6.9 oz (89.1 kg)] 196 lb 6.9 oz (89.1 kg) (08/03 1225) FIO2  1lpm NP  HEMODYNAMICS:   VENTILATOR SETTINGS: Vent Mode:  [-]  FiO2 (%):  [24 %] 24 % INTAKE / OUTPUT: Intake/Output   None     PHYSICAL EXAMINATION: General: anxious hoarse bf with mild pseudwheeze HEENT: nl dentition, turbinates, and orophanx. Nl external ear canals without cough reflex   NECK :  without JVD/Nodes/TM/ nl carotid upstrokes bilaterally   LUNGS: no acc muscle use, clear to A and P bilaterally without cough on insp or exp maneuvers   CV:  RRR  no s3 or murmur or increase in P2, no edema   ABD:  soft and nontender with nl excursion in the supine position. No bruits or organomegaly, bowel sounds nl  MS:  warm without deformities, calf tenderness, cyanosis or clubbing  SKIN: warm and dry without lesions    NEURO:  alert, approp, no deficits    LABS:  CBC Recent Labs     08/05/12  0635  WBC  15.6*  HGB  11.3*  HCT  34.4*  PLT  421*   Coag's Recent Labs     08/05/12  0635  INR  2.45*   BMET Recent Labs     08/05/12  0635  NA  136  K  3.9  CL  103  CO2  21  BUN  10  CREATININE  0.92  GLUCOSE  115*   Electrolytes Recent Labs     08/05/12  0635  CALCIUM  9.7   Sepsis Markers No results found for this basename: LACTICACIDVEN, PROCALCITON, O2SATVEN,  in the last 72 hours ABG Recent Labs     08/05/12  1154  PHART  7.439  PCO2ART  31.1*  PO2ART  70.0*   Liver Enzymes No results found for this basename: AST, ALT, ALKPHOS, BILITOT, ALBUMIN,  in the last 72 hours Cardiac Enzymes Recent Labs     08/05/12  1318  TROPONINI  <0.30    CTa Chest  08/05/12 1. Interval significant improvement in the overall clot burden in   both pulmonary arterial systems since the examination on  07/13/2012. There is residual clot as detailed above, though many  of the branches of the lower lobe pulmonary arteries bilaterally  have shown interval recanalization.  2. New atelectasis versus infarction in the right lower lobe. No  acute cardiopulmonary disease otherwise.  3. COPD/emphysema.  4. Cardiomegaly. Improved right heart strain, though the right  ventricular diameter remains slightly larger than the left  ventricular diameter.  5. New subcutaneous nodules in the chest wall since the prior  examination. These are of certain etiology. The largest is  measured above.   ASSESSMENT / PLAN:     1) Nocturnal episode of sob ? All upper airway (not felt the same since extubation despite d/c acei) rec max gerd rx/ rx as AB and make sure she doesn't accidentally restart her ACEi   2) Recent massive pe ? While on coumadin with documented DVT while on therapeutic INR on previous admit( though she nowsays was not on coumadin-  ? Confused with meds?)  so probably needs IVC filter forever and  Concern for  ? Underlying Ca (trousseaux's)  as hypercoagulable profile was neg  - will discuss with colleagues re best option for longterm anticoagulation.  Also note when fm asked about meds becamesomewhat defensive re issues of med administration  so will need careful outpt f/u using a trust but verify approach to med reconciliation to avoid errors.(note confusion on previous admit as to whether she was really taking coumadin or not)         Sandrea Hughs, MD Pulmonary and Critical Care Medicine [facility] Cell [phone] After 5:30 PM or weekends, call [phone]

## 2012-08-05 NOTE — H&P (Addendum)
[facility]                                                                                    Patient Demographics  Hannah [name], is a 72 y.o. female  CSN: [id number]  MRN: [id number]  DOB - Aug 23, 1940  Admit Date - 08/05/2012  Outpatient Primary MD for the patient is No primary provider on file.   With History of -  Past Medical History  Diagnosis Date  . Thyroid disease   . Diabetes mellitus without complication   . DVT (deep venous thrombosis)       History reviewed. No pertinent past surgical history.  in for   Chief Complaint  Patient presents with  . Shortness of Breath     HPI  Hannah [name]  is a 72 y.o. female, with history of diabetes mellitus, hypothyroidism, DVT in the past, who was recently admitted with a massive PE causing T. a cardiac arrest requiring CPR, full anticoagulation and IVC filter placement, she was recently discharged on full anticoagulation and currently was on Coumadin with therapeutic INR comes back after she started experiencing some shortness of breath this morning, she says that she was unable to catch her breath, she denies any chest pain or palpitations, she does have a cough which actually is getting better, no fever chills, in the ER she was found to have mild hypoxia upon ambulation, she was still 90% on room air, her EKG and lab work were unremarkable except for mild leukocytosis, her INR was therapeutic, I was called to admit the patient after discussions were made with pulmonary critical care. Her review of systems completely negative of note she says that recently she was diagnosed with a UTI with fungus and it, she was placed on antibiotic but not on an antifungal by her primary care physician.    Review of Systems    In addition to the HPI above,  No Fever-chills, No Headache, No changes with Vision or hearing, No problems swallowing food or Liquids, No Chest pain, mild Cough & Shortness of Breath, No Abdominal pain, No  Nausea or Vommitting, Bowel movements are regular, No Blood in stool or Urine, No dysuria, No new skin rashes or bruises, No new joints pains-aches,  No new weakness, tingling, numbness in any extremity, No recent weight gain or loss, No polyuria, polydypsia or polyphagia, No significant Mental Stressors.  A full 10 point Review of Systems was done, except as stated above, all other Review of Systems were negative.   Social History History  Substance Use Topics  . Smoking status: Former Smoker -- 1.00 packs/day for 10 years    Types: Cigarettes    Quit date: 01/04/1968  . Smokeless tobacco: Not on file  . Alcohol Use: No      Family History +ve PE in aunt  Prior to Admission medications   Medication Sig Start Date End Date Taking? Authorizing Provider  albuterol (PROVENTIL) (5 MG/ML) 0.5% nebulizer solution Take 0.5 mLs (2.5 mg total) by nebulization every 3 (three) hours as needed for wheezing or shortness of breath. 07/26/12  Yes Simonne Martinet, NP  amoxicillin-clavulanate (AUGMENTIN) 875-125 MG per tablet  Take 1 tablet by mouth 2 (two) times daily. 07/31/12 08/07/12 Yes Tammy S Parrett, NP  budesonide (PULMICORT) 0.25 MG/2ML nebulizer solution Take 2 mLs (0.25 mg total) by nebulization 2 (two) times daily. 07/26/12  Yes Simonne Martinet, NP  cholecalciferol (VITAMIN D) 1000 UNITS tablet Take 1,000 Units by mouth daily.    Yes Historical Provider, MD  enoxaparin (LOVENOX) 100 MG/ML injection Inject 100 mg into the skin 2 (two) times daily. 07/26/12  Yes Simonne Martinet, NP  glipiZIDE (GLUCOTROL) 5 MG tablet Take 5 mg by mouth 2 (two) times daily before a meal.   Yes Historical Provider, MD  ipratropium (ATROVENT) 0.02 % nebulizer solution Take 0.5 mg by nebulization every 6 (six) hours as needed (for shortness of breath). 07/26/12  Yes Simonne Martinet, NP  levothyroxine (SYNTHROID, LEVOTHROID) 50 MCG tablet Take 50 mcg by mouth daily before breakfast.   Yes Historical Provider, MD   metFORMIN (GLUCOPHAGE) 500 MG tablet Take 500 mg by mouth 2 (two) times daily with a meal.   Yes Historical Provider, MD  metoprolol tartrate (LOPRESSOR) 25 MG tablet Take 25 mg by mouth 2 (two) times daily.   Yes Historical Provider, MD  simvastatin (ZOCOR) 20 MG tablet Take 20 mg by mouth at bedtime.   Yes Historical Provider, MD  trolamine salicylate (ASPERCREME) 10 % cream Apply 1 application topically daily as needed (for arthritis).    Yes Historical Provider, MD  vitamin C (ASCORBIC ACID) 500 MG tablet Take 500 mg by mouth daily.    Yes Historical Provider, MD  warfarin (COUMADIN) 5 MG tablet Take 7.5 mg by mouth every evening. Take 1.5 tablets 07/26/12  Yes Simonne Martinet, NP    Allergies  Allergen Reactions  . Augmentin (Amoxicillin-Pot Clavulanate) Other (See Comments)    Severe vaginal itching    Physical Exam  Vitals  Blood pressure 105/42, pulse 99, temperature 97.9 F (36.6 C), temperature source Oral, resp. rate 18, SpO2 98.00%.   1. General elderly AA female lying in bed in NAD,     2. Normal affect and insight, Not Suicidal or Homicidal, Awake Alert, Oriented X 3.  3. No F.N deficits, ALL C.Nerves Intact, Strength 5/5 all 4 extremities, Sensation intact all 4 extremities, Plantars down going.  4. Ears and Eyes appear Normal, Conjunctivae clear, PERRLA. Moist Oral Mucosa.  5. Supple Neck, No JVD, No cervical lymphadenopathy appriciated, No Carotid Bruits.  6. Symmetrical Chest wall movement, Good air movement bilaterally, CTAB.  7. RRR, No Gallops, Rubs or Murmurs, No Parasternal Heave.  8. Positive Bowel Sounds, Abdomen Soft, Non tender, No organomegaly appriciated,No rebound -guarding or rigidity.  9.  No Cyanosis, Normal Skin Turgor, No Skin Rash or Bruise.  10. Good muscle tone,  joints appear normal , no effusions, Normal ROM.  11. No Palpable Lymph Nodes in Neck or Axillae     Data Review  CBC  Recent Labs Lab 08/05/12 0635  WBC 15.6*  HGB  11.3*  HCT 34.4*  PLT 421*  MCV 92.0  MCH 30.2  MCHC 32.8  RDW 16.9*   ------------------------------------------------------------------------------------------------------------------  Chemistries   Recent Labs Lab 08/05/12 0635  NA 136  K 3.9  CL 103  CO2 21  GLUCOSE 115*  BUN 10  CREATININE 0.92  CALCIUM 9.7   ------------------------------------------------------------------------------------------------------------------ CrCl is unknown because both a height and weight (above a minimum accepted value) are required for this calculation. ------------------------------------------------------------------------------------------------------------------ No results found for this basename: TSH, T4TOTAL, FREET3, T3FREE, THYROIDAB,  in the last 72 hours   Coagulation profile  Recent Labs Lab 07/30/12 08/05/12 0635  INR 4.0 2.45*   ------------------------------------------------------------------------------------------------------------------- No results found for this basename: DDIMER,  in the last 72 hours -------------------------------------------------------------------------------------------------------------------  Cardiac Enzymes No results found for this basename: CK, CKMB, TROPONINI, MYOGLOBIN,  in the last 168 hours ------------------------------------------------------------------------------------------------------------------ No components found with this basename: POCBNP,    ---------------------------------------------------------------------------------------------------------------  Urinalysis    Component Value Date/Time   COLORURINE AMBER* 07/14/2012 0039   APPEARANCEUR TURBID* 07/14/2012 0039   LABSPEC >1.046* 07/14/2012 0039   PHURINE 5.0 07/14/2012 0039   GLUCOSEU 250* 07/14/2012 0039   HGBUR LARGE* 07/14/2012 0039   BILIRUBINUR SMALL* 07/14/2012 0039   KETONESUR NEGATIVE 07/14/2012 0039   PROTEINUR 30* 07/14/2012 0039   UROBILINOGEN 1.0  07/14/2012 0039   NITRITE NEGATIVE 07/14/2012 0039   LEUKOCYTESUR NEGATIVE 07/14/2012 0039    ----------------------------------------------------------------------------------------------------------------  Imaging results:      Ct Angio Chest W/cm &/or Wo Cm  08/05/2012   *RADIOLOGY REPORT*  Clinical Data: Recurrence of shortness of breath and patient with prior bilateral pulmonary emboli on 07/13/2012.  CT ANGIOGRAPHY CHEST  Technique:  Multidetector CT imaging of the chest using the standard protocol during bolus administration of intravenous contrast. Multiplanar reconstructed images including MIPs were obtained and reviewed to evaluate the vascular anatomy.  Contrast: 60mL OMNIPAQUE IOHEXOL 350 MG/ML IV.  Comparison: CTA chest 07/13/2012.  Findings: Interval significant decrease in the clot burden and both pulmonary arterial systems since the prior examination.  There is residual adherent thrombus in the proximal left lower lobe pulmonary artery and in the proximal left upper lobe pulmonary artery.  There is moderate residual clot burden in the branches of the lower lobe pulmonary arteries, though there has been significant recanalization since the prior study.  Right heart strain identified on the prior examination has also improved in the interval, though the right ventricular diameter remains larger than that of the left ventricle.  Interval development of atelectasis and/or infarction in the medial right lower lobe.  No new parenchymal opacities elsewhere in either lung.  Emphysematous changes throughout both lungs. No pleural effusions.  Mild atherosclerosis involving the thoracic aorta without aneurysm or dissection.  Overall heart size mildly enlarged but stable.  No pericardial effusion.  Mild LAD coronary atherosclerosis.  No significant mediastinal, hilar, or axillary lymphadenopathy. New subcutaneous nodules in the anterior chest wall, the largest approximating 1.6 cm, new since the prior  examination.  Visualized thyroid gland unremarkable.  Visualized upper abdomen unremarkable for the early arterial phase of enhancement.  Bone window images demonstrate mid and lower thoracic spondylosis with large anterior bridging osteophytes at multiple levels.  IMPRESSION:  1.  Interval significant improvement in the overall clot burden in both pulmonary arterial systems since the examination on 07/13/2012.  There is residual clot as detailed above, though many of the branches of the lower lobe pulmonary arteries bilaterally have shown interval recanalization. 2.  New atelectasis versus infarction in the right lower lobe.  No acute cardiopulmonary disease otherwise.  3.  COPD/emphysema. 4.  Cardiomegaly.  Improved right heart strain, though the right ventricular diameter remains slightly larger than the left ventricular diameter. 5.  New subcutaneous nodules in the chest wall since the prior examination.  These are of certain etiology.  The largest is measured above.   Original Report Authenticated By: Hulan Saas, M.D.     Dg Chest Portable 1 View  08/05/2012   *RADIOLOGY REPORT*  Clinical Data: Shortness of  breath  PORTABLE CHEST - 1 VIEW  Comparison: Prior radiograph from 07/31/2012.  Findings: Cardiomegaly is stable as compared to the prior exam. Atherosclerotic calcifications are noted within the aortic arch.  There is elevation of the right hemidiaphragm, unchanged.  Linear and patchy right basilar airspace opacities are most consistent with atelectasis, also stable.  No pulmonary edema or definite pleural effusion.  No pneumothorax.   Osseous structures are unchanged.  IMPRESSION: Elevation of the right hemidiaphragm with associated right basilar atelectasis, stable as compared to the prior exam.   Original Report Authenticated By: Rise Mu, M.D.       My personal review of EKG: Rhythm NSR, Rate  97 /min, non specific ST changes    Assessment & Plan   1. Shortness of breath  which has already improved. Patient remains on room air appears to be in no distress and appears nontoxic- CT scan shows improved clot burden however there could be a new infarction which could explain for her shortness of breath, I doubt this is pneumonia as her cough is improved and she has no fever chills, as recommended by pulmonary critical care she will be kept in stepped-down overnight, oxygen nebulizer treatments as needed, will obtain blood cultures, pulmonary critical care is can see the patient. I have noticed that they have ordered full dose Lovenox. However I do not think she has failed Coumadin treatment per se. Will also cycle troponins to rule out a cardiac etiology and obtain an echogram to make sure there is no wall motion abnormality. Further recommendations per pulmonary critical care.    2. Diabetes mellitus type 2. Check A1c, continue all home medications except Glucophage as she received IV dye for CT scan, add q. a.c. at bedtime sliding scale.    3. Recent history of UTI and PCP outpatient few days ago per family. Will aim UA with culture, she was placed on Augmentin I will switch to Unasyn, family says PCP had mentioned she might have a fungal infection in her urine too, I will for now add Diflucan and monitor.    4. Hypertension continue home dose Lopressor.    5. Dyslipidemia continue home dose statin.    6. Hypothyroidism she will be kept on home dose Synthroid.     DVT Prophylaxis  Lovenox - Coumadin  AM Labs Ordered, also please review Full Orders  Family Communication: Admission, patients condition and plan of care including tests being ordered have been discussed with the patient and daughters who indicate understanding and agree with the plan and Code Status.  Code Status full  Likely DC to home  Time spent in minutes : 40  Condition Hannah [name] K M.D on 08/05/2012 at 12:11 PM  Between 7am to 7pm - Pager - [phone]  After 7pm go  to [email or web address] - password [email or web address]  And look for the night coverage person covering me after hours  [facility] Office  [phone]

## 2012-08-05 NOTE — ED Notes (Signed)
EDP at bedside attempting to gain peripheral IV access with ultrasound. IV team paged also.

## 2012-08-05 NOTE — ED Notes (Addendum)
~  0500: woke up with sob.  Hx. Of dvt/pe/resp. Failure, renal failure. Bilateral exp./insp. Pt. Did giver herself an albuterol neb; felt better after 3l [place]. Pt. Recently d/c'd for 07/19/12 for PE. Was ventilated.

## 2012-08-05 NOTE — ED Provider Notes (Signed)
CSN: [id number]     Arrival date & time 08/05/12  0557 History     First MD Initiated Contact with Patient 08/05/12 [phone]     Chief Complaint  Patient presents with  . Shortness of Breath   (Consider location/radiation/quality/duration/timing/severity/associated sxs/prior Treatment) Patient is a 72 y.o. female presenting with shortness of breath. The history is provided by the patient and a relative.  Shortness of Breath Associated symptoms: no abdominal pain, no chest pain, no headaches, no rash and no vomiting    patient woke with shortness of breath today. His recent massive pulmonary embolism and had a PEA cardiac arrest. She has an IVC filter in place. She is on Coumadin. She's recently been seen by her pulmonologist and was given antibiotics after x-ray showed possible infiltrate. No fevers. No cough. She's been doing well at her rehabilitation. No abdominal pain. No chest pain.  Past Medical History  Diagnosis Date  . Thyroid disease   . Diabetes mellitus without complication   . DVT (deep venous thrombosis)    History reviewed. No pertinent past surgical history. No family history on file. History  Substance Use Topics  . Smoking status: Former Smoker -- 1.00 packs/day for 10 years    Types: Cigarettes    Quit date: 01/04/1968  . Smokeless tobacco: Not on file  . Alcohol Use: No   OB History   Grav Para Term Preterm Abortions TAB SAB Ect Mult Living                 Review of Systems  Constitutional: Negative for activity change and appetite change.  HENT: Negative for neck stiffness.   Eyes: Negative for pain.  Respiratory: Positive for shortness of breath. Negative for chest tightness.   Cardiovascular: Negative for chest pain and leg swelling.  Gastrointestinal: Negative for nausea, vomiting, abdominal pain and diarrhea.  Genitourinary: Negative for flank pain.  Musculoskeletal: Negative for back pain.  Skin: Negative for rash.  Neurological: Negative for  weakness, numbness and headaches.  Psychiatric/Behavioral: Negative for behavioral problems.    Allergies  Augmentin  Home Medications   No current outpatient prescriptions on file. BP 103/67  Pulse 80  Temp(Src) 97.8 F (36.6 C) (Oral)  Resp 26  Ht 5\' 8"  (1.727 m)  Wt 199 lb 15.3 oz (90.7 kg)  BMI 30.41 kg/m2  SpO2 98% Physical Exam  Nursing note and vitals reviewed. Constitutional: She is oriented to person, place, and time. She appears well-developed and well-nourished.  HENT:  Head: Normocephalic and atraumatic.  Eyes: EOM are normal. Pupils are equal, round, and reactive to light.  Neck: Normal range of motion. Neck supple.  Cardiovascular: Normal rate, regular rhythm and normal heart sounds.   No murmur heard. Pulmonary/Chest: Effort normal. No respiratory distress. She has no wheezes. She has no rales.  Mild tachypnea.  Abdominal: Soft. Bowel sounds are normal. She exhibits no distension. There is no tenderness. There is no rebound and no guarding.  Musculoskeletal: Normal range of motion.  Neurological: She is alert and oriented to person, place, and time. No cranial nerve deficit.  Skin: Skin is warm and dry.  Psychiatric: She has a normal mood and affect. Her speech is normal.    ED Course   Procedures (including critical care time)  Labs Reviewed  BASIC METABOLIC PANEL - Abnormal; Notable for the following:    Glucose, Bld 115 (*)    GFR calc non Af Amer 61 (*)    GFR calc Af Amer 71 (*)  All other components within normal limits  CBC - Abnormal; Notable for the following:    WBC 15.6 (*)    RBC 3.74 (*)    Hemoglobin 11.3 (*)    HCT 34.4 (*)    RDW 16.9 (*)    Platelets 421 (*)    All other components within normal limits  PROTIME-INR - Abnormal; Notable for the following:    Prothrombin Time 25.8 (*)    INR 2.45 (*)    All other components within normal limits  URINALYSIS W MICROSCOPIC + REFLEX CULTURE - Abnormal; Notable for the following:     APPearance CLOUDY (*)    Specific Gravity, Urine 1.044 (*)    Leukocytes, UA MODERATE (*)    Bacteria, UA FEW (*)    Squamous Epithelial / LPF MANY (*)    All other components within normal limits  BASIC METABOLIC PANEL - Abnormal; Notable for the following:    Glucose, Bld 132 (*)    GFR calc non Af Amer 66 (*)    GFR calc Af Amer 77 (*)    All other components within normal limits  CBC - Abnormal; Notable for the following:    WBC 12.4 (*)    RBC 3.47 (*)    Hemoglobin 10.4 (*)    HCT 32.1 (*)    RDW 17.0 (*)    All other components within normal limits  PROTIME-INR - Abnormal; Notable for the following:    Prothrombin Time 27.1 (*)    INR 2.62 (*)    All other components within normal limits  GLUCOSE, CAPILLARY - Abnormal; Notable for the following:    Glucose-Capillary 171 (*)    All other components within normal limits  GLUCOSE, CAPILLARY - Abnormal; Notable for the following:    Glucose-Capillary 177 (*)    All other components within normal limits  POCT I-STAT 3, BLOOD GAS (G3+) - Abnormal; Notable for the following:    pCO2 arterial 31.1 (*)    pO2, Arterial 70.0 (*)    All other components within normal limits  CULTURE, BLOOD (ROUTINE X 2)  CULTURE, BLOOD (ROUTINE X 2)  URINE CULTURE  LACTIC ACID, PLASMA  TROPONIN I  TROPONIN I  TROPONIN I  HEMOGLOBIN A1C  POCT I-STAT TROPONIN I   Ct Angio Chest W/cm &/or Wo Cm  08/05/2012   *RADIOLOGY REPORT*  Clinical Data: Recurrence of shortness of breath and patient with prior bilateral pulmonary emboli on 07/13/2012.  CT ANGIOGRAPHY CHEST  Technique:  Multidetector CT imaging of the chest using the standard protocol during bolus administration of intravenous contrast. Multiplanar reconstructed images including MIPs were obtained and reviewed to evaluate the vascular anatomy.  Contrast: 60mL OMNIPAQUE IOHEXOL 350 MG/ML IV.  Comparison: CTA chest 07/13/2012.  Findings: Interval significant decrease in the clot burden and  both pulmonary arterial systems since the prior examination.  There is residual adherent thrombus in the proximal left lower lobe pulmonary artery and in the proximal left upper lobe pulmonary artery.  There is moderate residual clot burden in the branches of the lower lobe pulmonary arteries, though there has been significant recanalization since the prior study.  Right heart strain identified on the prior examination has also improved in the interval, though the right ventricular diameter remains larger than that of the left ventricle.  Interval development of atelectasis and/or infarction in the medial right lower lobe.  No new parenchymal opacities elsewhere in either lung.  Emphysematous changes throughout both lungs. No pleural effusions.  Mild atherosclerosis involving the thoracic aorta without aneurysm or dissection.  Overall heart size mildly enlarged but stable.  No pericardial effusion.  Mild LAD coronary atherosclerosis.  No significant mediastinal, hilar, or axillary lymphadenopathy. New subcutaneous nodules in the anterior chest wall, the largest approximating 1.6 cm, new since the prior examination.  Visualized thyroid gland unremarkable.  Visualized upper abdomen unremarkable for the early arterial phase of enhancement.  Bone window images demonstrate mid and lower thoracic spondylosis with large anterior bridging osteophytes at multiple levels.  IMPRESSION:  1.  Interval significant improvement in the overall clot burden in both pulmonary arterial systems since the examination on 07/13/2012.  There is residual clot as detailed above, though many of the branches of the lower lobe pulmonary arteries bilaterally have shown interval recanalization. 2.  New atelectasis versus infarction in the right lower lobe.  No acute cardiopulmonary disease otherwise.  3.  COPD/emphysema. 4.  Cardiomegaly.  Improved right heart strain, though the right ventricular diameter remains slightly larger than the left  ventricular diameter. 5.  New subcutaneous nodules in the chest wall since the prior examination.  These are of certain etiology.  The largest is measured above.   Original Report Authenticated By: Hulan Saas, M.D.   Dg Chest Portable 1 View  08/05/2012   *RADIOLOGY REPORT*  Clinical Data: Shortness of breath  PORTABLE CHEST - 1 VIEW  Comparison: Prior radiograph from 07/31/2012.  Findings: Cardiomegaly is stable as compared to the prior exam. Atherosclerotic calcifications are noted within the aortic arch.  There is elevation of the right hemidiaphragm, unchanged.  Linear and patchy right basilar airspace opacities are most consistent with atelectasis, also stable.  No pulmonary edema or definite pleural effusion.  No pneumothorax.   Osseous structures are unchanged.  IMPRESSION: Elevation of the right hemidiaphragm with associated right basilar atelectasis, stable as compared to the prior exam.   Original Report Authenticated By: Rise Mu, M.D.   1. Dyspnea   2. Pulmonary embolism   3. Acute massive pulmonary embolism   4. Acute respiratory failure with hypoxia   5. DM2 (diabetes mellitus, type 2)   6. Long term (current) use of anticoagulants     Date: 08/06/2012  Rate: 97  Rhythm: normal sinus rhythm  QRS Axis: left  Intervals: normal  ST/T Wave abnormalities: normal  Conduction Disutrbances:none  Narrative Interpretation:   Old EKG Reviewed: unchanged   MDM  Patient with shortness of breath and recent massive PE. Already is on warfarin and therapeutic. Also has an IVC filter in place. After discussion with pulmonology CT angiography will be done. X-ray shows possible atelectasis.  Juliet Rude. Rubin Payor, MD 08/06/12 [phone]

## 2012-08-06 DIAGNOSIS — I2699 Other pulmonary embolism without acute cor pulmonale: Principal | ICD-10-CM

## 2012-08-06 LAB — GLUCOSE, CAPILLARY
Glucose-Capillary: 213 mg/dL — ABNORMAL HIGH (ref 70–99)
Glucose-Capillary: 76 mg/dL (ref 70–99)
Glucose-Capillary: 119 mg/dL — ABNORMAL HIGH (ref 70–99)

## 2012-08-06 LAB — BASIC METABOLIC PANEL
CO2: 24 mEq/L (ref 19–32)
Calcium: 9.4 mg/dL (ref 8.4–10.5)
Chloride: 106 mEq/L (ref 96–112)
GFR calc Af Amer: 77 mL/min — ABNORMAL LOW (ref >90)
Sodium: 139 mEq/L (ref 135–145)

## 2012-08-06 LAB — CBC
MCV: 92.5 fL (ref 78.0–100.0)
Platelets: 369 10*3/uL (ref 150–400)
RBC: 3.47 MIL/uL — ABNORMAL LOW (ref 3.87–5.11)
RDW: 17 % — ABNORMAL HIGH (ref 11.5–15.5)
WBC: 12.4 10*3/uL — ABNORMAL HIGH (ref 4.0–10.5)

## 2012-08-06 LAB — TROPONIN I: Troponin I: <0.3 ng/mL (ref <0.30)

## 2012-08-06 LAB — PROTIME-INR: INR: 2.62 — ABNORMAL HIGH (ref 0.00–1.49)

## 2012-08-06 MED ORDER — WARFARIN SODIUM 5 MG PO TABS
5.0000 mg | ORAL_TABLET | Freq: Once | ORAL | Status: AC
  Administered 2012-08-06: 5 mg via ORAL
  Filled 2012-08-06: qty 1

## 2012-08-06 MED ORDER — INSULIN DETEMIR 100 UNIT/ML ~~LOC~~ SOLN
10.0000 [IU] | Freq: Every day | SUBCUTANEOUS | Status: DC
  Administered 2012-08-06: 10 [IU] via SUBCUTANEOUS
  Filled 2012-08-06 (×2): qty 0.1

## 2012-08-06 NOTE — Progress Notes (Signed)
Report called to receiving nurse on 5 [place].  All questions answered.  Patient and family aware of transfer.  No complaints.

## 2012-08-06 NOTE — Progress Notes (Addendum)
[facility] Progress Note [place] TEAM 1 - Stepdown/ICU TEAM   Hannah [name]:[id number] DOB: 1940-09-11 DOA: 08/05/2012 PCP: No primary provider on file.  Brief narrative: 72 y.o. Female with history of diabetes mellitus, hypothyroidism, DVT in the past, who was admitted 7/11 with massive PEs causing a cardiac arrest requiring CPR, full anticoagulation and IVC filter placement.  She was eventually discharged on full anticoagulation and was on Coumadin with therapeutic INR.  She came back after she started experiencing some shortness of breath.  She was unable to catch her breath.  She denied any chest pain or palpitations, fever, or chills.  In the ER she was found to have mild hypoxia upon ambulation, she was still 90% on room air.  Her EKG and lab work were unremarkable except for mild leukocytosis.  Her INR was therapeutic.  SIGNIFICANT EVENTS / Recent STUDIES:  7/11 >>> Admitted with PE, PEA arrest, given thrombolytics  7/11 >>> CT of Chest: central and obstructive bilateral pulmonary embolism with right heart strain  7/12 Echo >>> EF 60%, RV mod dilat, decreased systolic fxn, RA mild dilation , PAP -45  7/13 >>> Sig drop in Hbg on Hep , no obvious signs of bleeding - given 4u PRBC, protamine, and 1u FFP  7/13 CT Abd Pelvis >>> Intramuscular R thigh hematoma present  7/14 >>> IVC filter placement  7/14 extubated  7/15 doppler + DVT involving right common femoral vein, right femoral vein, and right popliteal vein - Cannot exclude deep vein thrombosis involving the left femoral vein.  07/25/12 >> changed to SQ lovenox as bridge to coumadin   Assessment/Plan:  Dyspnea CT scan shows decreased/improved clot burden - Pulm suggests max gerd rx + rx as AB - cont same - begin to ambulate and follow sx   Recent massive B PE s/p IVC filter, S/p TNK 7/11 while on coumadin with documented DVT & therapeutic INR (prior admit) - as per Pulm she needs IVC filter forever and concern for  ?underlying CA - hypercoagulable profile was neg - coumadin dosing continues per Pharmacy dosing with goal INR 2.5-3.5 - is followed in the  Coumadin Clinic - will need screening colo and mamogram as outpt - pursue subcut nodules while inpt (see below)   New subcutaneous nodules in the anterior chest wall Noted via CT chest - largest ~1.6 cm - in pt with ? etio for DVT/PE I feel we must bx these nodules to r/o metastatic CA - consult placed to IR - exam is unrevealing (w/ exception to change from breast reduction surgery) - possible simple hematomas?  DM2 CBG not at goal - adjust tx and follow trend   Recent UTI UA is abnormal - cont empiric coverage while awaiting speciation - d/c diflucan due to dangerous interaction w/ warfarin - will not resume unless urine cx indicates doing so  HTN BP currently well controlled  HLD Cont med tx  Hypothyroidism Cont replacement   Code Status: FULL Family Communication: no family present at time of exam  Disposition Plan: transfer to tele bed - await scheduling of subcut nodule bx - ambulate w/ PT - anticipate an additional 48hrs of inpt stay   Consultants: PCCM  IR  Procedures: none  Antibiotics: Diflucan 8/03 >> 8/4 Unasyn 8/03 >>  DVT prophylaxis: warfarin  HPI/Subjective: Pt is sitting comfortably in bed.  Denies any current cp, n/v, abdom pain, or sob.  She has not noticed andy lumps or spots on her chest wall.  She  denies recent falls or injury of any kind to the chest wall.  Objective: Blood pressure 116/58, pulse 64, temperature 97.9 F (36.6 C), temperature source Oral, resp. rate 10, height 5\' 8"  (1.727 m), weight 90.7 kg (199 lb 15.3 oz), SpO2 100.00%.  Intake/Output Summary (Last 24 hours) at 08/06/12 1419 Last data filed at 08/06/12 0357  Gross per 24 hour  Intake    100 ml  Output    550 ml  Net   -450 ml   Exam: General: No acute respiratory distress at rest  Lungs: Clear to auscultation bilaterally  without wheezes or crackles Cardiovascular: Regular rate and rhythm without murmur gallop or rub normal S1 and S2 Abdomen: Nontender, nondistended, soft, bowel sounds positive, no rebound, no ascites, no appreciable mass Extremities: No significant cyanosis, clubbing, or edema bilateral lower extremities Chest:  No palpable nodules or visible bruising to exam   Data Reviewed: Basic Metabolic Panel:  Recent Labs Lab 08/05/12 0635 08/06/12 0549  NA 136 139  K 3.9 4.3  CL 103 106  CO2 21 24  GLUCOSE 115* 132*  BUN 10 9  CREATININE 0.92 0.86  CALCIUM 9.7 9.4   Liver Function Tests: No results found for this basename: AST, ALT, ALKPHOS, BILITOT, PROT, ALBUMIN,  in the last 168 hours No results found for this basename: LIPASE, AMYLASE,  in the last 168 hours No results found for this basename: AMMONIA,  in the last 168 hours CBC:  Recent Labs Lab 08/05/12 0635 08/06/12 0549  WBC 15.6* 12.4*  HGB 11.3* 10.4*  HCT 34.4* 32.1*  MCV 92.0 92.5  PLT 421* 369   Cardiac Enzymes:  Recent Labs Lab 08/05/12 1318 08/05/12 1803 08/05/12 2330  TROPONINI <0.30 <0.30 <0.30   BNP (last 3 results)  Recent Labs  07/16/12 0345 07/18/12 0032 07/26/12 0550  PROBNP 3809.0* 8731.0* 1306.0*   CBG:  Recent Labs Lab 08/05/12 1807 08/05/12 2207 08/06/12 0727 08/06/12 1147  GLUCAP 171* 177* 161* 213*    Recent Results (from the past 240 hour(s))  CULTURE, BLOOD (ROUTINE X 2)     Status: None   Collection Time    08/05/12 11:40 AM      Result Value Range Status   Specimen Description BLOOD RIGHT ARM   Final   Special Requests BOTTLES DRAWN AEROBIC AND ANAEROBIC B 10CC R 6CC   Final   Culture  Setup Time 08/05/2012 21:45   Final   Culture     Final   Value:        BLOOD CULTURE RECEIVED NO GROWTH TO DATE CULTURE WILL BE HELD FOR 5 DAYS BEFORE ISSUING A FINAL NEGATIVE REPORT   Report Status PENDING   Incomplete  CULTURE, BLOOD (ROUTINE X 2)     Status: None   Collection Time     08/05/12  2:20 PM      Result Value Range Status   Specimen Description BLOOD RIGHT ARM   Final   Special Requests BOTTLES DRAWN AEROBIC ONLY 8CC   Final   Culture  Setup Time 08/05/2012 21:45   Final   Culture     Final   Value:        BLOOD CULTURE RECEIVED NO GROWTH TO DATE CULTURE WILL BE HELD FOR 5 DAYS BEFORE ISSUING A FINAL NEGATIVE REPORT   Report Status PENDING   Incomplete     Studies:  Recent x-ray studies have been reviewed in detail by the Attending Physician  Scheduled Meds:  Scheduled Meds: .  ampicillin-sulbactam (UNASYN) IV  3 g Intravenous Q6H  . budesonide  0.25 mg Nebulization BID  . cholecalciferol  1,000 Units Oral Daily  . fluconazole  100 mg Oral Daily  . glipiZIDE  5 mg Oral BID AC  . insulin aspart  0-5 Units Subcutaneous QHS  . insulin aspart  0-9 Units Subcutaneous TID WC  . ipratropium  0.5 mg Nebulization Q6H  . levothyroxine  50 mcg Oral QAC breakfast  . metoprolol tartrate  25 mg Oral BID  . pantoprazole  40 mg Oral BID AC  . simvastatin  20 mg Oral QHS  . sodium chloride  3 mL Intravenous Q12H  . warfarin  5 mg Oral ONCE-1800  . Warfarin - Pharmacist Dosing Inpatient   Does not apply q1800    Time spent on care of this patient:   [facility]  [facility] Office  [phone] Pager - Text Page per Loretha Stapler as per below:  On-Call/Text Page:      [email or web address]      password [email or web address]  If 7PM-7AM, please contact night-coverage [email or web address] Password [email or web address] 08/06/2012, 2:19 PM   LOS: 1 day

## 2012-08-06 NOTE — Progress Notes (Signed)
PULMONARY  / CRITICAL CARE MEDICINE  Name: Hannah [name] MRN: [id number] DOB: 1940/01/13    ADMISSION DATE:  08/05/2012 CONSULTATION DATE:  08/05/12  REFERRING MD :  [facility] PRIMARY SERVICE: [facility]  CHIEF COMPLAINT: sob  BRIEF PATIENT DESCRIPTION:   52 yobf admitted 7/11 with massive PE in setting of chronic dvt by HPI was on coumadin with inr  2.17 (though pt denies on this admit that she had been on it for over a year) complicated by pea arrest and acute blood loss requiring filter and d/c 7/24 with improving doe and minimal cough then abruptly am 8/3 recurrent resting sob > to er where readmit by [facility] and PCCM service asked to evaluatie  SIGNIFICANT EVENTS / STUDIES: 7/11 >>> Admitted with PE, PEA arrest, given thrombolytics 7/11 >>> CT of Chest: central and obstructive bilateral pulmonary embolism with right heart strain  7/12 Echo >>> EF 60%, RV mod dilat, decreased systolic fxn, RA mild dilation , PAP -45  7/13 >>> Sig drop in Hbg on Hep , no obvious signs of bleeding /oozing from IV sites, rectal, oral, urine. Given 4u PRBC, protamine, and 1u FFP  7/13 CT Abd Pelvis >>> Intramuscular R thigh hematoma present  7/14 >>> IVC filter placement  7/14 extubated  7/15-- Findings consistent with deep vein thrombosis involving the right common femoral vein, right femoral vein, and right popliteal vein.- Cannot exclude deep vein thrombosis involving the left femoral vein.  7/20> nurse reports BSs in the 60-80 range, eating fair, on Metform & Glyburide Bid=> Rec decr meds to Qam only for now; also note stridor, likely from intub & ETT- need assessment of cords this week  07/25/12 = changed to SQ lovenox       SUBJECTIVE:  Comfortable supine, dyspnea much improved Afebrile No CP  VITAL SIGNS: Temp:  [97.8 F (36.6 C)-98.7 F (37.1 C)] 97.8 F (36.6 C) (08/04 0722) Pulse Rate:  [64-99] 64 (08/04 0725) Resp:  [10-26] 10 (08/04 0725) BP: (101-136)/(36-70) 116/58 mmHg (08/04 0725) SpO2:   [98 %-100 %] 100 % (08/04 0923) FiO2 (%):  [24 %] 24 % (08/03 1419) Weight:  [89.1 kg (196 lb 6.9 oz)-90.7 kg (199 lb 15.3 oz)] 90.7 kg (199 lb 15.3 oz) (08/04 0351) FIO2  1lpm NP  HEMODYNAMICS:   VENTILATOR SETTINGS: Vent Mode:  [-]  FiO2 (%):  [24 %] 24 % INTAKE / OUTPUT: Intake/Output     08/03 0701 - 08/04 0700 08/04 0701 - 08/05 0700   IV Piggyback 100    Total Intake(mL/kg) 100 (1.1)    Urine (mL/kg/hr) 550 (0.3)    Total Output 550     Net -450            PHYSICAL EXAMINATION: General: anxious hoarse bf  HEENT: nl dentition, turbinates, and orophanx. Nl external ear canals without cough reflex   NECK :  without JVD/Nodes/TM/ nl carotid upstrokes bilaterally   LUNGS: no acc muscle use, clear to A and P bilaterally without cough on insp or exp maneuvers   CV:  RRR  no s3 or murmur or increase in P2, no edema   ABD:  soft and nontender with nl excursion in the supine position. No bruits or organomegaly, bowel sounds nl  MS:  warm without deformities, calf tenderness, cyanosis or clubbing  SKIN: warm and dry without lesions    NEURO:  alert, approp, no deficits    LABS:  CBC Recent Labs     08/05/12  0635  08/06/12  0549  WBC  15.6*  12.4*  HGB  11.3*  10.4*  HCT  34.4*  32.1*  PLT  421*  369   Coag's Recent Labs     08/05/12  0635  08/06/12  0549  INR  2.45*  2.62*   BMET Recent Labs     08/05/12  0635  08/06/12  0549  NA  136  139  K  3.9  4.3  CL  103  106  CO2  21  24  BUN  10  9  CREATININE  0.92  0.86  GLUCOSE  115*  132*   Electrolytes Recent Labs     08/05/12  0635  08/06/12  0549  CALCIUM  9.7  9.4   Sepsis Markers No results found for this basename: LACTICACIDVEN, PROCALCITON, O2SATVEN,  in the last 72 hours ABG Recent Labs     08/05/12  1154  PHART  7.439  PCO2ART  31.1*  PO2ART  70.0*   Liver Enzymes No results found for this basename: AST, ALT, ALKPHOS, BILITOT, ALBUMIN,  in the last 72 hours Cardiac  Enzymes Recent Labs     08/05/12  1318  08/05/12  1803  08/05/12  2330  TROPONINI  <0.30  <0.30  <0.30    CTa Chest  08/05/12 1. Interval significant improvement in the overall clot burden in  both pulmonary arterial systems since the examination on  07/13/2012. There is residual clot as detailed above, though many  of the branches of the lower lobe pulmonary arteries bilaterally  have shown interval recanalization.  2. New atelectasis versus infarction in the right lower lobe. No  acute cardiopulmonary disease otherwise.  3. COPD/emphysema.  4. Cardiomegaly. Improved right heart strain, though the right  ventricular diameter remains slightly larger than the left  ventricular diameter.  5. New subcutaneous nodules in the chest wall since the prior  examination.    ASSESSMENT / PLAN:     1) Nocturnal episode of sob ? All upper airway (not felt the same since extubation despite d/c acei) rec max gerd rx/ rx as AB and make sure she doesn't accidentally restart her ACEi   2) Recent massive pe ? While on coumadin with documented DVT while on therapeutic INR on previous admit( though she nowsays was not on coumadin-  ? Confused with meds?)  so probably needs IVC filter forever and  Concern for  ? Underlying Ca (trousseaux's)  as hypercoagulable profile was neg  - ct coumadin  Discuss with radiology about subcutaneous nodules  -not noted on prior CT? Aspiration biopsy -defer to [facility]  PCCM to sign off   Cyril Mourning MD. Tonny Bollman. [facility] Pager [phone] If no response call [phone]

## 2012-08-06 NOTE — Progress Notes (Signed)
[facility]  Patient Status: Active (receiving services up to time of hospitalization)  [facility] is providing the following services: RN, PT, OT and HHA  If patient discharges after hours, please call [phone].   Hannah [name] 08/06/2012, 9:52 AM

## 2012-08-06 NOTE — Progress Notes (Signed)
ANTICOAGULATION CONSULT NOTE - Follow Up Consult  Pharmacy Consult for Coumadin Indication: recent DVT/PE s/p IVC filter  Allergies  Allergen Reactions  . Augmentin (Amoxicillin-Pot Clavulanate) Other (See Comments)    Severe vaginal itching    Patient Measurements: Height: 5\' 8"  (172.7 cm) Weight: 199 lb 15.3 oz (90.7 kg) IBW/kg (Calculated) : 63.9 Heparin Dosing Weight:   Vital Signs: Temp: 97.8 F (36.6 C) (08/04 0722) Temp src: Oral (08/04 0722) BP: 116/58 mmHg (08/04 0725) Pulse Rate: 64 (08/04 0725)  Labs:  Recent Labs  08/05/12 0635 08/05/12 1318 08/05/12 1803 08/05/12 2330 08/06/12 0549  HGB 11.3*  --   --   --  10.4*  HCT 34.4*  --   --   --  32.1*  PLT 421*  --   --   --  369  LABPROT 25.8*  --   --   --  27.1*  INR 2.45*  --   --   --  2.62*  CREATININE 0.92  --   --   --  0.86  TROPONINI  --  <0.30 <0.30 <0.30  --     Estimated Creatinine Clearance: 70.7 ml/min (by C-G formula based on Cr of 0.86).  Assessment: 72yof continues on Coumadin for recent DVT/PE s/p IVC filter. INR (2.62) is now therapeutic. Per Coumadin Clinic notes, goal INR is 2.5 - 3.5 and most recent Coumadin regimen recommendations were 5mg  daily except 2.5mg  TTS. Please note, patient is also on Fluconazole which can increase the INR - monitor closely and follow-up need for antifungal. - H/H and Plts trending down - No significant bleeding reported  Goal of Therapy:  INR 2.5 - 3.5   Plan:  1. Coumadin 5mg  po x 1 today 2. Follow-up AM INR  Cleon Dew [phone] 08/06/2012,11:08 AM

## 2012-08-06 NOTE — Progress Notes (Signed)
Echocardiogram 2D Echocardiogram has been performed.  Hannah [name] 08/06/2012, 9:27 AM

## 2012-08-07 DIAGNOSIS — N390 Urinary tract infection, site not specified: Secondary | ICD-10-CM

## 2012-08-07 LAB — PROTIME-INR
INR: 2.81 — ABNORMAL HIGH (ref 0.00–1.49)
Prothrombin Time: 28.6 seconds — ABNORMAL HIGH (ref 11.6–15.2)

## 2012-08-07 LAB — CBC
Hemoglobin: 10.7 g/dL — ABNORMAL LOW (ref 12.0–15.0)
MCHC: 31.2 g/dL (ref 30.0–36.0)
WBC: 9.6 10*3/uL (ref 4.0–10.5)

## 2012-08-07 LAB — URINE CULTURE: Colony Count: 30000

## 2012-08-07 LAB — GLUCOSE, CAPILLARY
Glucose-Capillary: 94 mg/dL (ref 70–99)
Glucose-Capillary: 114 mg/dL — ABNORMAL HIGH (ref 70–99)
Glucose-Capillary: 134 mg/dL — ABNORMAL HIGH (ref 70–99)

## 2012-08-07 MED ORDER — INSULIN DETEMIR 100 UNIT/ML ~~LOC~~ SOLN
5.0000 [IU] | Freq: Every day | SUBCUTANEOUS | Status: DC
  Administered 2012-08-07: 5 [IU] via SUBCUTANEOUS
  Filled 2012-08-07 (×2): qty 0.05

## 2012-08-07 MED ORDER — WARFARIN SODIUM 5 MG PO TABS
5.0000 mg | ORAL_TABLET | ORAL | Status: DC
Start: 2012-08-08 — End: 2012-08-08
  Filled 2012-08-07: qty 1

## 2012-08-07 MED ORDER — WARFARIN SODIUM 2.5 MG PO TABS
2.5000 mg | ORAL_TABLET | ORAL | Status: DC
  Administered 2012-08-07: 2.5 mg via ORAL
  Filled 2012-08-07: qty 1

## 2012-08-07 MED ORDER — CIPROFLOXACIN HCL 250 MG PO TABS
250.0000 mg | ORAL_TABLET | Freq: Two times a day (BID) | ORAL | Status: DC
  Administered 2012-08-07 – 2012-08-08 (×3): 250 mg via ORAL
  Filled 2012-08-07 (×6): qty 1

## 2012-08-07 NOTE — Evaluation (Signed)
Physical Therapy Evaluation Patient Details Name: Hannah [name] MRN: [id number] DOB: 1940-07-24 Today's Date: 08/07/2012 Time: 9147-8295 PT Time Calculation (min): 29 min  PT Assessment / Plan / Recommendation History of Present Illness  72 y.o. female admitted to [facility] for SOB with recent d/c for PE.     Clinical Impression  The pt states that her breathing feels better.  She got very tachy cardic during gait (HR increased from 90s-130s-confirmed with monitor).  Pt was active with [facility] HHPT/OT PTA and is appropriate to resume this at discharge.      PT Assessment  Patient needs continued PT services    Follow Up Recommendations  Home health PT;Supervision - Intermittent (resume therapy services with [facility])    Does the patient have the potential to tolerate intense rehabilitation     Yes  Barriers to Discharge   None None    Equipment Recommendations  None recommended by PT    Recommendations for Other Services   None  Frequency Min 3X/week    Precautions / Restrictions Precautions Precautions: Fall Precaution Comments: monitor HR and O2 sats   Pertinent Vitals/Pain HR 90s-130s during gait, O2 sats 96-98% on RA during gait.  DOE 2/4 during gait.        Mobility  Bed Mobility Bed Mobility: Supine to Sit;Sitting - Scoot to Edge of Bed Supine to Sit: 6: Modified independent (Device/Increase time);HOB elevated;With rails Sitting - Scoot to Edge of Bed: 6: Modified independent (Device/Increase time);With rail Details for Bed Mobility Assistance: used railing for support to get to EOB.   Transfers Transfers: Sit to Stand;Stand to Sit Sit to Stand: 4: Min assist;With upper extremity assist;From bed;From chair/3-in-1 Stand to Sit: 4: Min assist;With upper extremity assist;With armrests;To chair/3-in-1 Details for Transfer Assistance: min assist to support trunk over weak legs.   Ambulation/Gait Ambulation/Gait Assistance: 4: Min guard Ambulation Distance (Feet):  120 Feet Assistive device: 4-wheeled walker Ambulation/Gait Assistance Details: Pt mildly unsteady on her feet while using the rollator for gait.  SOB 2/4 wtih O2 sats in the mid to upper 90s.  HR increased from 90s to 130s.  Pt reports she could feel her heart beating harder.   Gait Pattern: Step-through pattern;Shuffle;Trunk flexed Gait velocity: less than 1.8 ft/sec indicating risk for recurrent falls        PT Diagnosis: Difficulty walking;Abnormality of gait;Generalized weakness  PT Problem List: Decreased strength;Decreased activity tolerance;Decreased balance;Decreased mobility;Cardiopulmonary status limiting activity PT Treatment Interventions: DME instruction;Gait training;Stair training;Functional mobility training;Therapeutic activities;Therapeutic exercise;Balance training;Neuromuscular re-education;Patient/family education     PT Goals(Current goals can be found in the care plan section) Acute Rehab PT Goals Patient Stated Goal: to return home with family PT Goal Formulation: With patient Time For Goal Achievement: 08/21/12 Potential to Achieve Goals: Good  Visit Information  Last PT Received On: 08/07/12 Assistance Needed: +1 History of Present Illness: 72 y.o. female admitted to [facility] for SOB with recent d/c for PE.          Prior Functioning  Home Living Family/patient expects to be discharged to:: Private residence Living Arrangements: Children (staying with daughter right now) Available Help at Discharge: Family;Available 24 hours/day Type of Home: House (daughter's home) Home Access: Stairs to enter [employer] of Steps: 1 Home Layout: One level Home Equipment: Walker - 4 wheels;Cane - single point;Tub bench Prior Function Level of Independence: Needs assistance Gait / Transfers Assistance Needed: supervision with RW at home ADL's / Homemaking Assistance Needed: total assist cooking and cleaning Communication /  Swallowing Assistance Needed:  none Comments: staying with her daughter since her last admission to the hospital.  Was active with [facility] HHPT/OT Communication Communication: No difficulties    Cognition  Cognition Arousal/Alertness: Awake/alert Behavior During Therapy: WFL for tasks assessed/performed Overall Cognitive Status: Within Functional Limits for tasks assessed    Extremity/Trunk Assessment Upper Extremity Assessment Upper Extremity Assessment: Generalized weakness Lower Extremity Assessment Lower Extremity Assessment: Generalized weakness Cervical / Trunk Assessment Cervical / Trunk Assessment: Normal   Balance Static Standing Balance Static Standing - Balance Support: Right upper extremity supported;Left upper extremity supported;During functional activity (one upper extremity) Static Standing - Level of Assistance: 5: Stand by assistance Static Standing - Comment/# of Minutes: supervision needed pt relies on one hand for balance while preforming toileting and bathing tasks in the bathroom.   Dynamic Standing Balance Dynamic Standing - Balance Support: Right upper extremity supported;Left upper extremity supported;During functional activity (one upper extremity supported) Dynamic Standing - Level of Assistance: 5: Stand by assistance  End of Session PT - End of Session Activity Tolerance: Patient limited by fatigue Patient left: in chair;with call bell/phone within reach    Hinton B. Fin Hupp, PT, DPT [phone]   08/07/2012, 11:30 AM

## 2012-08-07 NOTE — Progress Notes (Signed)
ANTICOAGULATION CONSULT NOTE - Follow Up Consult  Pharmacy Consult for Coumadin Indication: recent DVT/PE s/p IVC filter  Allergies  Allergen Reactions  . Augmentin (Amoxicillin-Pot Clavulanate) Other (See Comments)    Severe vaginal itching    Patient Measurements: Height: 5\' 8"  (172.7 cm) Weight: 203 lb 11.3 oz (92.4 kg) IBW/kg (Calculated) : 63.9 Heparin Dosing Weight:   Vital Signs: Temp: 98.4 F (36.9 C) (08/05 0553) Temp src: Oral (08/05 0553) BP: 123/74 mmHg (08/05 0553) Pulse Rate: 92 (08/05 0553)  Labs:  Recent Labs  08/05/12 0635 08/05/12 1318 08/05/12 1803 08/05/12 2330 08/06/12 0549 08/07/12 0500  HGB 11.3*  --   --   --  10.4* 10.7*  HCT 34.4*  --   --   --  32.1* 34.3*  PLT 421*  --   --   --  369 344  LABPROT 25.8*  --   --   --  27.1* 28.6*  INR 2.45*  --   --   --  2.62* 2.81*  CREATININE 0.92  --   --   --  0.86  --   TROPONINI  --  <0.30 <0.30 <0.30  --   --     Estimated Creatinine Clearance: 71.3 ml/min (by C-G formula based on Cr of 0.86).  Assessment: 72yof continues on Coumadin for recent DVT/PE s/p IVC filter. INR (2.81) is  therapeutic. Per Coumadin Clinic notes, goal INR is 2.5 - 3.5 due to pt had PE on therapeutic INR 2.17 in 7/14 and most recent Coumadin regimen recommendations were 5mg  daily except 2.5mg  TTS. Please note, patient was on Fluconazole x2 doses now stopped.  This can increase the INR - will monitor closely.  ABX changed to Cipro for pseudomonas UTI.   - H/H and Plts slight trend down - No significant bleeding reported  Goal of Therapy:  INR 2.5 - 3.5   Plan:  1. Coumadin 5mg  MWFSu, 2.5mg  TTSa 2. Follow-up AM INR   Leota Sauers Pharm.D. CPP, BCPS Clinical Pharmacist [phone] 08/07/2012 10:43 AM

## 2012-08-07 NOTE — Progress Notes (Signed)
INITIAL NUTRITION ASSESSMENT  DOCUMENTATION CODES Per approved criteria  -Obesity Unspecified   INTERVENTION: 1.  General healthful diet; encourage intake as able.  Will continue to monitor for need of nutrition supplements. Discussed nutrition needs and ways to achieve.  Answered questions related to diabetes-friendly meals.  NUTRITION DIAGNOSIS: Unintended wt change related to poor appetite  as evidenced by 10 lbs wt loss in 3 weeks.  Monitor:  1.  Food/Beverage; pt meeting >/=90% estimated needs with tolerance. 2.  Wt/wt change; monitor trends  Reason for Assessment: MST  72 y.o. female  Admitting Dx: PE  ASSESSMENT: Pt admitted with shortness of breath. Pt reports poor appetite since d/c from hospital in July.  She reports 10 lbs wt loss since d/c which represents 4.6% in <1 month.  Pt reports her appetite is improving and she was able to eat 50% of meal today.  Nutrition Focused Physical Exam:  Subcutaneous Fat:  Orbital Region: wnl Upper Arm Region: wnl Thoracic and Lumbar Region: wnl  Muscle:  Temple Region: wnl Clavicle Bone Region: wnl Clavicle and Acromion Bone Region: wnl Scapular Bone Region: wnl Dorsal Hand: wnl Patellar Region: n/a Anterior Thigh Region: n/a Posterior Calf Region: n/a  Edema: none present   Height: Ht Readings from Last 1 Encounters:  08/06/12 5\' 8"  (1.727 m)    Weight: Wt Readings from Last 1 Encounters:  08/07/12 203 lb 11.3 oz (92.4 kg)    Ideal Body Weight: 63.6 lg  % Ideal Body Weight: 145%  Wt Readings from Last 10 Encounters:  08/07/12 203 lb 11.3 oz (92.4 kg)  07/31/12 199 lb 6.4 oz (90.447 kg)  07/24/12 212 lb 15.4 oz (96.6 kg)  07/24/12 212 lb 15.4 oz (96.6 kg)    Usual Body Weight: 215 lbs  % Usual Body Weight: 94%  BMI:  Body mass index is 30.98 kg/(m^2).  Estimated Nutritional Needs: Kcal: 1610-9604 Protein: 70-85g Fluid: ~2.0 L/day  Skin: generalized edema  Diet Order: Carb  Control  EDUCATION NEEDS: -Education needs addressed   Intake/Output Summary (Last 24 hours) at 08/07/12 1358 Last data filed at 08/07/12 0230  Gross per 24 hour  Intake    300 ml  Output      0 ml  Net    300 ml    Last BM: 8/2  Labs:   Recent Labs Lab 08/05/12 0635 08/06/12 0549  NA 136 139  K 3.9 4.3  CL 103 106  CO2 21 24  BUN 10 9  CREATININE 0.92 0.86  CALCIUM 9.7 9.4  GLUCOSE 115* 132*    CBG (last 3)   Recent Labs  08/06/12 2121 08/07/12 0802 08/07/12 1157  GLUCAP 119* 94 98    Scheduled Meds: . budesonide  0.25 mg Nebulization BID  . cholecalciferol  1,000 Units Oral Daily  . ciprofloxacin  250 mg Oral BID  . glipiZIDE  5 mg Oral BID AC  . insulin aspart  0-9 Units Subcutaneous TID WC  . insulin detemir  5 Units Subcutaneous QHS  . ipratropium  0.5 mg Nebulization Q6H  . levothyroxine  50 mcg Oral QAC breakfast  . metoprolol tartrate  25 mg Oral BID  . pantoprazole  40 mg Oral BID AC  . simvastatin  20 mg Oral QHS  . sodium chloride  3 mL Intravenous Q12H  . warfarin  2.5 mg Oral Q T,Th,Sat-1800  . [START ON 08/08/2012] warfarin  5 mg Oral Q M,W,F,Su-1800  . Warfarin - Pharmacist Dosing Inpatient   Does  not apply q1800    Continuous Infusions:   Past Medical History  Diagnosis Date  . Thyroid disease   . Diabetes mellitus without complication   . DVT (deep venous thrombosis)     History reviewed. No pertinent past surgical history.  Loyce Dys, MS RD LDN Clinical Inpatient Dietitian Pager: [phone] Weekend/After hours pager: [phone]

## 2012-08-07 NOTE — Progress Notes (Signed)
Inpatient Diabetes Program Recommendations  AACE/ADA: New Consensus Statement on Inpatient Glycemic Control (2013)  Target Ranges:  Prepandial:   less than 140 mg/dL      Peak postprandial:   less than 180 mg/dL (1-2 hours)      Critically ill patients:  140 - 180 mg/dL   Reason for Assessment: Glucose elevated yesterday  Note:  On home dose of Glucotrol.  Received Levemir 10 units last night (dosage reduced to 5 units at HS today) and receiving sensitive correction.  A1C is 5.3 indicating tight glycemic control.   Results for Hannah [name], Hannah [name] (MRN [id number]) as of 08/07/2012 15:17  Ref. Range 08/07/2012 08:02 08/07/2012 11:57  Glucose-Capillary Latest Range: 70-99 mg/dL 94 98   Recommend Glucotrol be stopped as oral hypoglycemics in the hospital setting can contribute to hypoglycemia.  May not need Levemir as well.  Thank you.  Tabathia Knoche S. Elsie Lincoln, RN, CNS, CDE Inpatient Diabetes Program, team pager [phone]

## 2012-08-07 NOTE — Progress Notes (Addendum)
PATIENT DETAILS Name: Hannah [name] Age: 72 y.o. Sex: female Date of Birth: 26-Jul-1940 Admit Date: 08/05/2012 Admitting Physician Leroy Sea, MD ZOX:[id number],UJWJ S, MD  72 yo female with history of diabetes mellitus, hypothyroidism, DVT in the past, was admitted 7/11 with massive PEs causing a cardiac arrest requiring CPR, full anticoagulation, and IVC filter placement.  She was discharged on full anticoagulation and was on Coumadin with therapeutic INR.  She returned to the hospital after experiencing shortness of breath at rest.  She was unable to catch her breath.  She denied any chest pain or palpitations, fever, or chills.  In the ER she was found to have mild hypoxia upon ambulation, she was 90% on room air.  Her EKG and lab work were unremarkable except for mild leukocytosis.  Her INR was therapeutic.  Subjective: Patient states she had no shortness of breath or difficulty breathing overnight.  She denies chest pain, pleuritic chest pain, or palpitations.  She reports feeling left ear drainage but has no visual discharge, swelling, or pain.  Pt reports her primary care physician is Dr. Algie Coffer.  SIGNIFICANT EVENTS / Recent STUDIES:  7/11 - Admitted with PE, PEA arrest, given thrombolytics  7/11 - CT of Chest: central and obstructive bilateral pulmonary embolism with right heart strain  7/12 - Echo showed EF 60%, RV moderate dilation, decreased systolic fxn, RA mild dilation , PAP -45  7/13 - Sig drop in Hbg on Hep , no obvious signs of bleeding - given 4u PRBC, protamine, and 1u FFP  7/13 - CT Abd Pelvis Intramuscular Right thigh hematoma present  7/14 - IVC filter placement  7/14 - Extubated  7/15 - Doppler + DVT involving right common femoral vein, right femoral vein, and right popliteal vein - Cannot exclude deep vein thrombosis involving the left femoral vein.  07/25/12 - Changed to SQ lovenox as bridge to coumadin 8/3 - Presented to ED with SOB 8/4 - CT Angio chest -  Improvement in clot burden since previous Ct, COPD/emphysema, Cardiomegaly, Atelectasis vs infarction in RLL, Subcutaneous nodules  Assessment/Plan: Dyspnea - CT scan shows decreased/improved clot burden - Continue protonix bid - Begin to ambulate and monitor symptoms.  Ambulating pulse ox. - INR therapeutic  Recent massive bilateral PE s/p IVC filter, s/p thrombolytic 7/11 - Non-compliance with Coumadin vs On Coumadin with documented therapeutic INR (prior to admit) - IVC filter is permanent per Pulmonology - Concern for underlying CA, hypercoaguable profile was negative - Continue coumadin dosing per pharmacy with goal INR 2.0-3.0 - Followed in the [facility] - Will need screening colonoscopy and mammogram as outpatient  New subcutaneous nodules in the anterior chest wall - ? Etiology, Appear Fluid filled.  Per IR will defer bx-because per IR-these appear cystic, will need to follow these in the outpatient setting-if persists or enlarged, then consider bx at that point. - Noted via CT chest, largest ~1.6 cm  Diabetes mellitus (DMT2) - on lantus (5 u) and novolog SSI-S - Continue to adjust as appropriate.  Recent UTI - U/A is abnormal, Pseudomonas evident on culture (< 30,000 colonies)-await sensitivity - D/C diflucan due to interaction with warfarin - Treat with ciprofloxacin (250 mg bid) started 8/5.  Unasyn dc'd  Hypertension - BP currently well-controlled  Hyperlipidemia - Continue home medications  Hypothyroidism - Continue replacement    Disposition: Remain inpatient  DVT Prophylaxis: Warfarin  Code Status: Full code   Family Communication None at bedside  Procedures:  None  CONSULTS:  pulmonary/intensive care and IR   MEDICATIONS: Scheduled Meds: . budesonide  0.25 mg Nebulization BID  . cholecalciferol  1,000 Units Oral Daily  . ciprofloxacin  250 mg Oral BID  . glipiZIDE  5 mg Oral BID AC  . insulin aspart  0-9 Units Subcutaneous  TID WC  . insulin detemir  5 Units Subcutaneous QHS  . ipratropium  0.5 mg Nebulization Q6H  . levothyroxine  50 mcg Oral QAC breakfast  . metoprolol tartrate  25 mg Oral BID  . pantoprazole  40 mg Oral BID AC  . simvastatin  20 mg Oral QHS  . sodium chloride  3 mL Intravenous Q12H  . Warfarin - Pharmacist Dosing Inpatient   Does not apply q1800   Continuous Infusions:  PRN Meds:.albuterol, HYDROcodone-acetaminophen, ondansetron (ZOFRAN) IV, ondansetron, polyethylene glycol  Antibiotics: Anti-infectives   Start     Dose/Rate Route Frequency Ordered Stop   08/07/12 1000  ciprofloxacin (CIPRO) tablet 250 mg     250 mg Oral 2 times daily 08/07/12 0952     08/05/12 1400  Ampicillin-Sulbactam (UNASYN) 3 g in sodium chloride 0.9 % 100 mL IVPB  Status:  Discontinued     3 g 100 mL/hr over 60 Minutes Intravenous Every 6 hours 08/05/12 1229 08/07/12 1031   08/05/12 1400  fluconazole (DIFLUCAN) tablet 100 mg  Status:  Discontinued     100 mg Oral Daily 08/05/12 1229 08/06/12 1503       PHYSICAL EXAM: Vital signs in last 24 hours: Filed Vitals:   08/06/12 2022 08/06/12 2117 08/07/12 0148 08/07/12 0553  BP:  108/72  123/74  Pulse:  88  92  Temp:  98.3 F (36.8 C)  98.4 F (36.9 C)  TempSrc:  Oral  Oral  Resp:  20  20  Height:      Weight:    92.4 kg (203 lb 11.3 oz)  SpO2: 97% 96% 100% 98%    Weight change: 3.524 kg (7 lb 12.3 oz) Filed Weights   08/06/12 0351 08/06/12 1745 08/07/12 0553  Weight: 90.7 kg (199 lb 15.3 oz) 92.625 kg (204 lb 3.2 oz) 92.4 kg (203 lb 11.3 oz)   Body mass index is 30.98 kg/(m^2).   Gen Exam: Awake and alert with clear speech. HEENT: Left ear non tender to palpation.  Tympanic membrane is gray, non-bulging, non-erythematous.   Neck: Supple, No JVD.   Chest: B/L Clear.  Good air movement throughout  CVS: S1 S2 Regular, no murmurs.  Abdomen: soft, BS +, non tender, non distended.  Extremities: No edema, lower extremities warm to touch. Neurologic:  Non Focal.   Skin: No Rash.   Wounds: N/A.    Intake/Output from previous day:  Intake/Output Summary (Last 24 hours) at 08/07/12 1040 Last data filed at 08/07/12 0230  Gross per 24 hour  Intake    300 ml  Output      0 ml  Net    300 ml     LAB RESULTS: CBC  Recent Labs Lab 08/05/12 0635 08/06/12 0549 08/07/12 0500  WBC 15.6* 12.4* 9.6  HGB 11.3* 10.4* 10.7*  HCT 34.4* 32.1* 34.3*  PLT 421* 369 344  MCV 92.0 92.5 93.2  MCH 30.2 30.0 29.1  MCHC 32.8 32.4 31.2  RDW 16.9* 17.0* 17.0*    Chemistries   Recent Labs Lab 08/05/12 0635 08/06/12 0549  NA 136 139  K 3.9 4.3  CL 103 106  CO2 21 24  GLUCOSE 115* 132*  BUN 10  9  CREATININE 0.92 0.86  CALCIUM 9.7 9.4    CBG:  Recent Labs Lab 08/06/12 0727 08/06/12 1147 08/06/12 1716 08/06/12 2121 08/07/12 0802  GLUCAP 161* 213* 76 119* 94    GFR Estimated Creatinine Clearance: 71.3 ml/min (by C-G formula based on Cr of 0.86).  Coagulation profile  Recent Labs Lab 08/05/12 0635 08/06/12 0549 08/07/12 0500  INR 2.45* 2.62* 2.81*    Cardiac Enzymes  Recent Labs Lab 08/05/12 1318 08/05/12 1803 08/05/12 2330  TROPONINI <0.30 <0.30 <0.30     Recent Labs  08/05/12 1417  HGBA1C 5.3    MICROBIOLOGY: Recent Results (from the past 240 hour(s))  CULTURE, BLOOD (ROUTINE X 2)     Status: None   Collection Time    08/05/12 11:40 AM      Result Value Range Status   Specimen Description BLOOD RIGHT ARM   Final   Special Requests BOTTLES DRAWN AEROBIC AND ANAEROBIC B 10CC R 6CC   Final   Culture  Setup Time 08/05/2012 21:45   Final   Culture     Final   Value:        BLOOD CULTURE RECEIVED NO GROWTH TO DATE CULTURE WILL BE HELD FOR 5 DAYS BEFORE ISSUING A FINAL NEGATIVE REPORT   Report Status PENDING   Incomplete  CULTURE, BLOOD (ROUTINE X 2)     Status: None   Collection Time    08/05/12  2:20 PM      Result Value Range Status   Specimen Description BLOOD RIGHT ARM   Final   Special  Requests BOTTLES DRAWN AEROBIC ONLY 8CC   Final   Culture  Setup Time 08/05/2012 21:45   Final   Culture     Final   Value:        BLOOD CULTURE RECEIVED NO GROWTH TO DATE CULTURE WILL BE HELD FOR 5 DAYS BEFORE ISSUING A FINAL NEGATIVE REPORT   Report Status PENDING   Incomplete  URINE CULTURE     Status: None   Collection Time    08/05/12  7:36 PM      Result Value Range Status   Specimen Description URINE, CLEAN CATCH   Final   Special Requests NONE   Final   Culture  Setup Time     Final   Value: 08/06/2012 02:07     Performed at [facility] Count     Final   Value: 30,000 COLONIES/ML     Performed at [facility]   Culture     Final   Value: PSEUDOMONAS AERUGINOSA     Performed at [facility]   Report Status PENDING   Incomplete    RADIOLOGY STUDIES/RESULTS: Ct Abdomen Pelvis Wo Contrast  07/15/2012   *RADIOLOGY REPORT*  Clinical Data: Acute drop in hemoglobin.  Recent TPA menstruation for pulmonary embolism.  Evaluate for retroperitoneal hemorrhage.  CT ABDOMEN AND PELVIS WITHOUT CONTRAST  Technique:  Multidetector CT imaging of the abdomen and pelvis was performed following the standard protocol without intravenous contrast.  Comparison: 04/24/2008  Findings: Images through the lung bases show a tiny bilateral pleural effusions and bibasilar atelectasis.  Contrast is seen filling the gallbladder, consistent with vicarious excretion related to recent chest CTA. Residual contrast enhancement of renal parenchyma is seen bilaterally, consistent with medical renal disease.  There is no evidence of retroperitoneal hemorrhage or other abdominal or pelvic hematoma. Inferior most images of these cysts study shows asymmetric enlargement of the  anterior right thigh muscles, suspicious for intramuscular hematoma, although this is incompletely visualized on this exam.  The Noncontrast images of the liver, spleen, pancreas, and adrenal glands appear normal.  Foley  catheter is seen within the bladder which is decompressed.  No soft tissue masses or lymphadenopathy identified.  No evidence of inflammatory process or abnormal fluid collections.  The left hip prosthesis results in beam hardening artifact to the inferior pelvis.  No evidence of dilated bowel loops.  IMPRESSION:  1.  No evidence of retroperitoneal hemorrhage. 2.  Suspect intramuscular hematoma in the anterior right thigh, which is incompletely visualized on this study. Suggest clinical correlation with clinical exam findings. 3.  Tiny bilateral pleural effusions and bibasilar atelectasis. 4.  Residual renal parenchymal contrast enhancement and vicarious excretion of contrast in the gallbladder.  This consistent with renal dysfunction.  No evidence of hydronephrosis.   Original Report Authenticated By: Myles Rosenthal, M.D.   Dg Chest 2 View  07/31/2012   *RADIOLOGY REPORT*  Clinical Data: Cough, shortness of breath, chest pain, history of pulmonary embolism  CHEST - 2 VIEW  Comparison: 07/16/2012; 07/15/2012; chest CT - 07/13/2012  Findings:  There is obscuration of the right heart border secondary to elevation of the right hemidiaphragm the right medial basilar heterogeneous opacities.  Otherwise, grossly unchanged borderline enlarged cardiac silhouette and mediastinal contours with atherosclerotic plaque within the thoracic aorta.  Evaluation of retrosternal clear space is obscured secondary to overlying soft tissues.  No pleural effusion or pneumothorax.  Grossly unchanged bones.  IMPRESSION: Partial atelectasis/collapse of the right lower lung with associated right perihilar opacities, atelectasis versus infiltrate.  A follow-up chest radiograph in 4 to 6 weeks after treatment is recommended to ensure resolution.   Original Report Authenticated By: Tacey Ruiz, MD   Ct Angio Chest W/cm &/or Wo Cm  08/05/2012   *RADIOLOGY REPORT*  Clinical Data: Recurrence of shortness of breath and patient with prior bilateral  pulmonary emboli on 07/13/2012.  CT ANGIOGRAPHY CHEST  Technique:  Multidetector CT imaging of the chest using the standard protocol during bolus administration of intravenous contrast. Multiplanar reconstructed images including MIPs were obtained and reviewed to evaluate the vascular anatomy.  Contrast: 60mL OMNIPAQUE IOHEXOL 350 MG/ML IV.  Comparison: CTA chest 07/13/2012.  Findings: Interval significant decrease in the clot burden and both pulmonary arterial systems since the prior examination.  There is residual adherent thrombus in the proximal left lower lobe pulmonary artery and in the proximal left upper lobe pulmonary artery.  There is moderate residual clot burden in the branches of the lower lobe pulmonary arteries, though there has been significant recanalization since the prior study.  Right heart strain identified on the prior examination has also improved in the interval, though the right ventricular diameter remains larger than that of the left ventricle.  Interval development of atelectasis and/or infarction in the medial right lower lobe.  No new parenchymal opacities elsewhere in either lung.  Emphysematous changes throughout both lungs. No pleural effusions.  Mild atherosclerosis involving the thoracic aorta without aneurysm or dissection.  Overall heart size mildly enlarged but stable.  No pericardial effusion.  Mild LAD coronary atherosclerosis.  No significant mediastinal, hilar, or axillary lymphadenopathy. New subcutaneous nodules in the anterior chest wall, the largest approximating 1.6 cm, new since the prior examination.  Visualized thyroid gland unremarkable.  Visualized upper abdomen unremarkable for the early arterial phase of enhancement.  Bone window images demonstrate mid and lower thoracic spondylosis with large anterior bridging  osteophytes at multiple levels.  IMPRESSION:  1.  Interval significant improvement in the overall clot burden in both pulmonary arterial systems since the  examination on 07/13/2012.  There is residual clot as detailed above, though many of the branches of the lower lobe pulmonary arteries bilaterally have shown interval recanalization. 2.  New atelectasis versus infarction in the right lower lobe.  No acute cardiopulmonary disease otherwise.  3.  COPD/emphysema. 4.  Cardiomegaly.  Improved right heart strain, though the right ventricular diameter remains slightly larger than the left ventricular diameter. 5.  New subcutaneous nodules in the chest wall since the prior examination.  These are of certain etiology.  The largest is measured above.   Original Report Authenticated By: Hulan Saas, M.D.   Ct Angio Chest Pe W/cm &/or Wo Cm  07/13/2012   *RADIOLOGY REPORT*  Clinical Data: Palpitations and syncope.  CT ANGIOGRAPHY CHEST  Technique:  Multidetector CT imaging of the chest using the standard protocol during bolus administration of intravenous contrast. Multiplanar reconstructed images including MIPs were obtained and reviewed to evaluate the vascular anatomy.  Contrast: 80mL OMNIPAQUE IOHEXOL 350 MG/ML SOLN  Comparison: 03/11/2008.  Findings:  THORACIC INLET/BODY WALL:  No acute abnormality.  MEDIASTINUM:  Extensive pulmonary embolism, essentially with saddle embolus. There are obstructive or nearly obstructive clots in multiple lobar pulmonary arteries, with the right upper lobe least severely affected.  Right heart strain with enlargement of the right atrium and right ventricle.  The ascending aorta measures 3.7 cm.  Few coronary artery atherosclerotic calcifications. No adenopathy.  LUNG WINDOWS:  Mild dependent atelectasis.  There is some scarring in the medial basal segment right lower lobe related to large osteophytes. No pulmonary infarct at this time.  UPPER ABDOMEN:  Mild thickening of the imaged portions of the bilateral adrenal glands.  OSSEOUS:  No acute fracture.  No suspicious lytic or blastic lesions.  Critical Value/emergent results were  called by telephone at the time of interpretation on 07/13/12 at approximately 11am to Dr. Mora Bellman, who verbally acknowledged these results.  Reportedly the patient is hypotensive/pulseless.  IMPRESSION:  Central and obstructive bilateral pulmonary embolism with right heart strain.   Original Report Authenticated By: Tiburcio Pea   Dg Chest Portable 1 View  08/05/2012   *RADIOLOGY REPORT*  Clinical Data: Shortness of breath  PORTABLE CHEST - 1 VIEW  Comparison: Prior radiograph from 07/31/2012.  Findings: Cardiomegaly is stable as compared to the prior exam. Atherosclerotic calcifications are noted within the aortic arch.  There is elevation of the right hemidiaphragm, unchanged.  Linear and patchy right basilar airspace opacities are most consistent with atelectasis, also stable.  No pulmonary edema or definite pleural effusion.  No pneumothorax.   Osseous structures are unchanged.  IMPRESSION: Elevation of the right hemidiaphragm with associated right basilar atelectasis, stable as compared to the prior exam.   Original Report Authenticated By: Rise Mu, M.D.   Dg Chest Port 1 View  07/16/2012   *RADIOLOGY REPORT*  Clinical Data: Intubation, shortness of breath, diabetes  PORTABLE CHEST - 1 VIEW  Comparison: Portable exam 0523 hours compared to 07/15/2012  Findings: Tip of endotracheal tube 1.9 cm above carina. Upper normal heart size. Bibasilar atelectasis versus consolidation increased since previous exam. Peribronchial thickening. Upper lungs clear. No gross pleural effusion or pneumothorax.  Curvilinear density projects over the medial right upper lobe, question external to patient, does not follow a normal vascular structure and no definite central line is visualized.  IMPRESSION: Increased bibasilar atelectasis versus  consolidation. Curvilinear density projects over medial right upper lobe, question superimposed external artifact, recommend clinical correlation.   Original Report Authenticated  By: Ulyses Southward, M.D.   Dg Chest Port 1 View  07/15/2012   *RADIOLOGY REPORT*  Clinical Data: Intubated patient.  PORTABLE CHEST - 1 VIEW  Comparison: 07/14/2012  Findings: Endotracheal tube appears unchanged in position.  Shallow inspiration with atelectasis in the lung bases.  No developing consolidation.  No pneumothorax.  No significant interval change.  IMPRESSION: Shallow inspiration with bilateral basilar atelectasis. Endotracheal tube is unchanged in position.   Original Report Authenticated By: Burman Nieves, M.D.   Dg Chest Port 1 View  07/14/2012   *RADIOLOGY REPORT*  Clinical Data: Respiratory failure.  PORTABLE CHEST - 1 VIEW  Comparison: 07/13/2012  Findings: Endotracheal tube present with the tip approximately 4 cm above the carina.  Lungs show mild bibasilar atelectasis.  No overt edema is identified.  No significant pleural fluid is seen.  Heart size is within normal limits.  IMPRESSION: Bibasilar atelectasis.   Original Report Authenticated By: Irish Lack, M.D.   Dg Chest Portable 1 View  07/13/2012   *RADIOLOGY REPORT*  Clinical Data: Central line and endotracheal tube placement  PORTABLE CHEST - 1 VIEW  Comparison: Portable chest x-ray of 07/13/2012  Findings: The tip of the endotracheal tube is approximately 1.4 cm above the carina.  No central venous line is seen.  No pneumothorax is noted.  The lungs are not optimally aerated.  Heart size is stable.  IMPRESSION:  1.  Tip of endotracheal tube 1.4 cm above the carina. 2.  No central venous line is seen.   Original Report Authenticated By: Dwyane Dee, M.D.   Dg Chest Port 1 View  07/13/2012   *RADIOLOGY REPORT*  Clinical Data: Chest pain  PORTABLE CHEST - 1 VIEW  Comparison: 02/28/2008  Findings: Cardiomediastinal silhouette is stable.  No acute infiltrate or pleural effusion.  No pulmonary edema.  Mild degenerative changes thoracic spine.  IMPRESSION: No active disease.  Mild degenerative changes thoracic spine.   Original  Report Authenticated By: Natasha Mead, M.D.    Michae Kava, PA-S  [place], [place] [facility] [phone]   If 7PM-7AM, please contact night-coverage [email or web address] Password [email or web address] 08/07/2012, 10:40 AM   LOS: 2 days   Attending Patient seen and examined,agree with the assessment and plan. Await sensitivities on the Urine cs-prelim Pseudomonas-stop Unasyn, start Cipro and monitor INR. Informed Dr Johny Sax PCP-he will see the patient later today.  S Ghimire

## 2012-08-07 NOTE — Care Management Note (Signed)
    Page 1 of 2   08/08/2012     11:14:47 AM   CARE MANAGEMENT NOTE 08/08/2012  Patient:  Hannah [name], Hannah [name]   Account Number:  [id number]  Date Initiated:  08/07/2012  Documentation initiated by:  Letha Cape  Subjective/Objective Assessment:   dx acute massive pe, uti, sob,  admit- from home, active with [facility] for [facility], PT, OT, aide, will need to resume.     Action/Plan:   pt eval- rec hhpt,ot   Anticipated DC Date:  08/08/2012   Anticipated DC Plan:  HOME W HOME HEALTH SERVICES      DC Planning Services  CM consult      [facility] Choice  Resumption Of Svcs/PTA Provider   Choice offered to / List presented to:  C-1 Patient        HH arranged  HH-1 RN  HH-2 PT  HH-3 OT  HH-4 NURSE'S AIDE      HH agency  [facility]   Status of service:  Completed, signed off Medicare Important Message given?   (If response is "NO", the following Medicare IM given date fields will be blank) Date Medicare IM given:   Date Additional Medicare IM given:    Discharge Disposition:  HOME W HOME HEALTH SERVICES  Per UR Regulation:  Reviewed for med. necessity/level of care/duration of stay  If discussed at Long Length of Stay Meetings, dates discussed:    Comments:  08/08/12 11;12 Letha Cape RN, BSN [phone] patient is for dc today, home with [facility] services, [facility] will need to draw pt/inr on 8/7 and fax to [facility] at [phone].  Patient has medication coverage and trasnportation at discharge.  08/07/12 15:39 Letha Cape RN, BSN [phone] patient is from home, patient is active with [facility], PT, OT and aide.  Patient would like to continue with [facility], Lupita Leash notified patient is probably for dc tomorrow. Soc will begin 24-48 hrs post discharge.

## 2012-08-08 LAB — PROTIME-INR: INR: 2.77 — ABNORMAL HIGH (ref 0.00–1.49)

## 2012-08-08 MED ORDER — GLIPIZIDE 5 MG PO TABS
5.0000 mg | ORAL_TABLET | Freq: Every day | ORAL | Status: DC
Start: 2012-08-08 — End: 2013-03-22

## 2012-08-08 MED ORDER — WARFARIN SODIUM 5 MG PO TABS: 2.5000 mg | ORAL_TABLET | Freq: Every day | ORAL | Status: DC

## 2012-08-08 MED ORDER — CIPROFLOXACIN HCL 250 MG PO TABS: 250.0000 mg | ORAL_TABLET | Freq: Two times a day (BID) | ORAL | Status: DC

## 2012-08-08 NOTE — Discharge Summary (Signed)
PATIENT DETAILS Name: Hannah [name] Age: 71 y.o. Sex: female Date of Birth: 1940/11/29 MRN: [id number]. Admit Date: 08/05/2012 Admitting Physician: Hannah Sea, MD JXB:[id number],AOZH S, MD  Recommendations for Outpatient Follow-up:  Follow up with Dr. Algie [name] regarding:  (1) diabetic medications (reduced in the hospital) and (2) blood pressure medications.  Patient has Acute Renal Failure secondary to dehydration on Lisinopril.  Lisinopril D/C'd (3) Normocytic Anemia.  (4) PLEASE SCHEDULE COLONOSCOPY AND MAMMOGRAM to search for malignancy given multiple critical blood clots. (5) Monitor new subcutaneous nodules on anterior chest wall seen on CT.  If change or enlargement, re-consider biopsy.   INR will be checked by Home health RN on 8/7.  Level will be called to Hannah [name] for dosage adjustment.  PRIMARY DISCHARGE DIAGNOSIS: Dyspnea - uncertain etiology - CT scan showed decreased/improved clot burden  - Continue protonix bid for GERD prophylaxis - Remain off of Ace Inhibitor (lisinopril discontinued) - O2 saturations during ambulation with PT were 96-98% on room air.  - INR therapeutic will be followed by Hannah [name].  Recent massive bilateral PE s/p IVC filter, s/p thrombolytic 7/11  - Continue Coumadin with documented therapeutic INRs  - IVC filter is permanent per Pulmonology  - Concern for underlying CA, hypercoaguable profile was negative.  Will need screening colonoscopy and mammogram as outpatient.  - Followed in the [name] with Home Health nurse checking levels.   New subcutaneous nodules in the anterior chest wall  - Unknown etiology, Appeared fluid filled. Per IR will defer biopsy-because these appear cystic, will need to follow these in the outpatient setting-if persists or enlarged, then re-consider biopsy at that point.  - Noted via CT chest, largest ~1.6 cm   Diabetes mellitus (DMT2)  -Hgb A1C is 5.3 -Treated with insulin inpatient  -Will  continue metformin outpatient  -Decreased dose of glipizide to 5 mg daily. -Will ask Dr. Algie [name] to monitor  Recent UTI  - U/A is abnormal, Pseudomonas evident on culture (< 30,000 colonies) pansensitive - Diflucan discontinued due to interaction with warfarin  - Began treatment with ciprofloxacin (250 mg bid) started 8/5, continue for total of 7 days.    Hypertension  - BP currently well-controlled - Continue home medications   Hyperlipidemia  - Continue home medications   Hypothyroidism  - Continue replacement       PAST MEDICAL HISTORY: Past Medical History  Diagnosis Date  . Thyroid disease   . Diabetes mellitus without complication   . DVT (deep venous thrombosis)     DISCHARGE MEDICATIONS:   Medication List    STOP taking these medications       amoxicillin-clavulanate 875-125 MG per tablet  Commonly known as:  AUGMENTIN     enoxaparin 100 MG/ML injection  Commonly known as:  LOVENOX      TAKE these medications       albuterol (5 MG/ML) 0.5% nebulizer solution  Commonly known as:  PROVENTIL  Take 0.5 mLs (2.5 mg total) by nebulization every 3 (three) hours as needed for wheezing or shortness of breath.     budesonide 0.25 MG/2ML nebulizer solution  Commonly known as:  PULMICORT  Take 2 mLs (0.25 mg total) by nebulization 2 (two) times daily.     cholecalciferol 1000 UNITS tablet  Commonly known as:  VITAMIN D  Take 1,000 Units by mouth daily.     ciprofloxacin 250 MG tablet  Commonly known as:  CIPRO  Take 1 tablet (250 mg total) by mouth 2 (  two) times daily.     glipiZIDE 5 MG tablet  Commonly known as:  GLUCOTROL  Take 1 tablet (5 mg total) by mouth daily before breakfast.     ipratropium 0.02 % nebulizer solution  Commonly known as:  ATROVENT  Take 0.5 mg by nebulization every 6 (six) hours as needed (for shortness of breath).     levothyroxine 50 MCG tablet  Commonly known as:  SYNTHROID, LEVOTHROID  Take 50 mcg by mouth daily before  breakfast.     metFORMIN 500 MG tablet  Commonly known as:  GLUCOPHAGE  Take 500 mg by mouth 2 (two) times daily with a meal.     metoprolol tartrate 25 MG tablet  Commonly known as:  LOPRESSOR  Take 25 mg by mouth 2 (two) times daily.     simvastatin 20 MG tablet  Commonly known as:  ZOCOR  Take 20 mg by mouth at bedtime.     trolamine salicylate 10 % cream  Commonly known as:  ASPERCREME  Apply 1 application topically daily as needed (for arthritis).     vitamin C 500 MG tablet  Commonly known as:  ASCORBIC ACID  Take 500 mg by mouth daily.     warfarin 5 MG tablet  Commonly known as:  COUMADIN  Take 0.5-1 tablets (2.5-5 mg total) by mouth daily. 5mg  daily except 2.5mg  TTS (per Coumad[name] notes)        ALLERGIES:   Allergies  Allergen Reactions  . Augmentin (Amoxicillin-Pot Clavulanate) Other (See Comments)    Severe vaginal itching    BRIEF HPI:  See H&P, Labs, Consult and Test reports for all details in brief.  [name] is a 72 yo female with history of diabetes mellitus, hypothyroidism, DVT in the past, was admitted 7/11 with massive PEs causing a cardiac arrest requiring CPR, full anticoagulation, and IVC filter placement. She was discharged on full anticoagulation and was on Coumadin with therapeutic INR. She returned to the hospital after experiencing shortness of breath at rest, she was unable to catch her breath.  Her EKG and lab work were unremarkable except for mild leukocytosis. Her INR was therapeutic.  In the ER she was found to have mild hypoxia upon ambulation, she was 90% on room air.   CONSULTATIONS:   pulmonary/intensive care and IR  PERTINENT RADIOLOGIC STUDIES: Ct Abdomen Pelvis Wo Contrast  07/15/2012   *RADIOLOGY REPORT*  Clinical Data: Acute drop in hemoglobin.  Recent TPA menstruation for pulmonary embolism.  Evaluate for retroperitoneal hemorrhage.  CT ABDOMEN AND PELVIS WITHOUT CONTRAST  Technique:  Multidetector CT imaging of the  abdomen and pelvis was performed following the standard protocol without intravenous contrast.  Comparison: 04/24/2008  Findings: Images through the lung bases show a tiny bilateral pleural effusions and bibasilar atelectasis.  Contrast is seen filling the gallbladder, consistent with vicarious excretion related to recent chest CTA. Residual contrast enhancement of renal parenchyma is seen bilaterally, consistent with medical renal disease.  There is no evidence of retroperitoneal hemorrhage or other abdominal or pelvic hematoma. Inferior most images of these cysts study shows asymmetric enlargement of the anterior right thigh muscles, suspicious for intramuscular hematoma, although this is incompletely visualized on this exam.  The Noncontrast images of the liver, spleen, pancreas, and adrenal glands appear normal.  Foley catheter is seen within the bladder which is decompressed.  No soft tissue masses or lymphadenopathy identified.  No evidence of inflammatory process or abnormal fluid collections.  The left hip prosthesis results  in beam hardening artifact to the inferior pelvis.  No evidence of dilated bowel loops.  IMPRESSION:  1.  No evidence of retroperitoneal hemorrhage. 2.  Suspect intramuscular hematoma in the anterior right thigh, which is incompletely visualized on this study. Suggest clinical correlation with clinical exam findings. 3.  Tiny bilateral pleural effusions and bibasilar atelectasis. 4.  Residual renal parenchymal contrast enhancement and vicarious excretion of contrast in the gallbladder.  This consistent with renal dysfunction.  No evidence of hydronephrosis.   Original Report Authenticated By: Myles Rosenthal, M.D.   Dg Chest 2 View  07/31/2012   *RADIOLOGY REPORT*  Clinical Data: Cough, shortness of breath, chest pain, history of pulmonary embolism  CHEST - 2 VIEW  Comparison: 07/16/2012; 07/15/2012; chest CT - 07/13/2012  Findings:  There is obscuration of the right heart border secondary  to elevation of the right hemidiaphragm the right medial basilar heterogeneous opacities.  Otherwise, grossly unchanged borderline enlarged cardiac silhouette and mediastinal contours with atherosclerotic plaque within the thoracic aorta.  Evaluation of retrosternal clear space is obscured secondary to overlying soft tissues.  No pleural effusion or pneumothorax.  Grossly unchanged bones.  IMPRESSION: Partial atelectasis/collapse of the right lower lung with associated right perihilar opacities, atelectasis versus infiltrate.  A follow-up chest radiograph in 4 to 6 weeks after treatment is recommended to ensure resolution.   Original Report Authenticated By: Tacey Ruiz, MD   Ct Angio Chest W/cm &/or Wo Cm  08/05/2012   *RADIOLOGY REPORT*  Clinical Data: Recurrence of shortness of breath and patient with prior bilateral pulmonary emboli on 07/13/2012.  CT ANGIOGRAPHY CHEST  Technique:  Multidetector CT imaging of the chest using the standard protocol during bolus administration of intravenous contrast. Multiplanar reconstructed images including MIPs were obtained and reviewed to evaluate the vascular anatomy.  Contrast: 60mL OMNIPAQUE IOHEXOL 350 MG/ML IV.  Comparison: CTA chest 07/13/2012.  Findings: Interval significant decrease in the clot burden and both pulmonary arterial systems since the prior examination.  There is residual adherent thrombus in the proximal left lower lobe pulmonary artery and in the proximal left upper lobe pulmonary artery.  There is moderate residual clot burden in the branches of the lower lobe pulmonary arteries, though there has been significant recanalization since the prior study.  Right heart strain identified on the prior examination has also improved in the interval, though the right ventricular diameter remains larger than that of the left ventricle.  Interval development of atelectasis and/or infarction in the medial right lower lobe.  No new parenchymal opacities elsewhere  in either lung.  Emphysematous changes throughout both lungs. No pleural effusions.  Mild atherosclerosis involving the thoracic aorta without aneurysm or dissection.  Overall heart size mildly enlarged but stable.  No pericardial effusion.  Mild LAD coronary atherosclerosis.  No significant mediastinal, hilar, or axillary lymphadenopathy. New subcutaneous nodules in the anterior chest wall, the largest approximating 1.6 cm, new since the prior examination.  Visualized thyroid gland unremarkable.  Visualized upper abdomen unremarkable for the early arterial phase of enhancement.  Bone window images demonstrate mid and lower thoracic spondylosis with large anterior bridging osteophytes at multiple levels.  IMPRESSION:  1.  Interval significant improvement in the overall clot burden in both pulmonary arterial systems since the examination on 07/13/2012.  There is residual clot as detailed above, though many of the branches of the lower lobe pulmonary arteries bilaterally have shown interval recanalization. 2.  New atelectasis versus infarction in the right lower lobe.  No acute  cardiopulmonary disease otherwise.  3.  COPD/emphysema. 4.  Cardiomegaly.  Improved right heart strain, though the right ventricular diameter remains slightly larger than the left ventricular diameter. 5.  New subcutaneous nodules in the chest wall since the prior examination.  These are of certain etiology.  The largest is measured above.   Original Report Authenticated By: Hulan Saas, M.D.   Ct Angio Chest Pe W/cm &/or Wo Cm  07/13/2012   *RADIOLOGY REPORT*  Clinical Data: Palpitations and syncope.  CT ANGIOGRAPHY CHEST  Technique:  Multidetector CT imaging of the chest using the standard protocol during bolus administration of intravenous contrast. Multiplanar reconstructed images including MIPs were obtained and reviewed to evaluate the vascular anatomy.  Contrast: 80mL OMNIPAQUE IOHEXOL 350 MG/ML SOLN  Comparison: 03/11/2008.   Findings:  THORACIC INLET/BODY WALL:  No acute abnormality.  MEDIASTINUM:  Extensive pulmonary embolism, essentially with saddle embolus. There are obstructive or nearly obstructive clots in multiple lobar pulmonary arteries, with the right upper lobe least severely affected.  Right heart strain with enlargement of the right atrium and right ventricle.  The ascending aorta measures 3.7 cm.  Few coronary artery atherosclerotic calcifications. No adenopathy.  LUNG WINDOWS:  Mild dependent atelectasis.  There is some scarring in the medial basal segment right lower lobe related to large osteophytes. No pulmonary infarct at this time.  UPPER ABDOMEN:  Mild thickening of the imaged portions of the bilateral adrenal glands.  OSSEOUS:  No acute fracture.  No suspicious lytic or blastic lesions.  Critical Value/emergent results were called by telephone at the time of interpretation on 07/13/12 at approximately 11am to Dr. Mora Bellman, who verbally acknowledged these results.  Reportedly the patient is hypotensive/pulseless.  IMPRESSION:  Central and obstructive bilateral pulmonary embolism with right heart strain.   Original Report Authenticated By: Tiburcio Pea   Dg Chest Portable 1 View  08/05/2012   *RADIOLOGY REPORT*  Clinical Data: Shortness of breath  PORTABLE CHEST - 1 VIEW  Comparison: Prior radiograph from 07/31/2012.  Findings: Cardiomegaly is stable as compared to the prior exam. Atherosclerotic calcifications are noted within the aortic arch.  There is elevation of the right hemidiaphragm, unchanged.  Linear and patchy right basilar airspace opacities are most consistent with atelectasis, also stable.  No pulmonary edema or definite pleural effusion.  No pneumothorax.   Osseous structures are unchanged.  IMPRESSION: Elevation of the right hemidiaphragm with associated right basilar atelectasis, stable as compared to the prior exam.   Original Report Authenticated By: Rise Mu, M.D.   Dg Chest Port 1  View  07/16/2012   *RADIOLOGY REPORT*  Clinical Data: Intubation, shortness of breath, diabetes  PORTABLE CHEST - 1 VIEW  Comparison: Portable exam 0523 hours compared to 07/15/2012  Findings: Tip of endotracheal tube 1.9 cm above carina. Upper normal heart size. Bibasilar atelectasis versus consolidation increased since previous exam. Peribronchial thickening. Upper lungs clear. No gross pleural effusion or pneumothorax.  Curvilinear density projects over the medial right upper lobe, question external to patient, does not follow a normal vascular structure and no definite central line is visualized.  IMPRESSION: Increased bibasilar atelectasis versus consolidation. Curvilinear density projects over medial right upper lobe, question superimposed external artifact, recommend clinical correlation.   Original Report Authenticated By: Ulyses Southward, M.D.   Dg Chest Port 1 View  07/15/2012   *RADIOLOGY REPORT*  Clinical Data: Intubated patient.  PORTABLE CHEST - 1 VIEW  Comparison: 07/14/2012  Findings: Endotracheal tube appears unchanged in position.  Shallow inspiration with atelectasis  in the lung bases.  No developing consolidation.  No pneumothorax.  No significant interval change.  IMPRESSION: Shallow inspiration with bilateral basilar atelectasis. Endotracheal tube is unchanged in position.   Original Report Authenticated By: Burman Nieves, M.D.   Dg Chest Port 1 View  07/14/2012   *RADIOLOGY REPORT*  Clinical Data: Respiratory failure.  PORTABLE CHEST - 1 VIEW  Comparison: 07/13/2012  Findings: Endotracheal tube present with the tip approximately 4 cm above the carina.  Lungs show mild bibasilar atelectasis.  No overt edema is identified.  No significant pleural fluid is seen.  Heart size is within normal limits.  IMPRESSION: Bibasilar atelectasis.   Original Report Authenticated By: Irish Lack, M.D.   Dg Chest Portable 1 View  07/13/2012   *RADIOLOGY REPORT*  Clinical Data: Central line and  endotracheal tube placement  PORTABLE CHEST - 1 VIEW  Comparison: Portable chest x-ray of 07/13/2012  Findings: The tip of the endotracheal tube is approximately 1.4 cm above the carina.  No central venous line is seen.  No pneumothorax is noted.  The lungs are not optimally aerated.  Heart size is stable.  IMPRESSION:  1.  Tip of endotracheal tube 1.4 cm above the carina. 2.  No central venous line is seen.   Original Report Authenticated By: Dwyane Dee, M.D.   Dg Chest Port 1 View  07/13/2012   *RADIOLOGY REPORT*  Clinical Data: Chest pain  PORTABLE CHEST - 1 VIEW  Comparison: 02/28/2008  Findings: Cardiomediastinal silhouette is stable.  No acute infiltrate or pleural effusion.  No pulmonary edema.  Mild degenerative changes thoracic spine.  IMPRESSION: No active disease.  Mild degenerative changes thoracic spine.   Original Report Authenticated By: Natasha Mead, M.D.     PERTINENT LAB RESULTS: CBC:  Recent Labs  08/06/12 0549 08/07/12 0500  WBC 12.4* 9.6  HGB 10.4* 10.7*  HCT 32.1* 34.3*  PLT 369 344   CMET CMP     Component Value Date/Time   NA 139 08/06/2012 0549   K 4.3 08/06/2012 0549   CL 106 08/06/2012 0549   CO2 24 08/06/2012 0549   GLUCOSE 132* 08/06/2012 0549   BUN 9 08/06/2012 0549   CREATININE 0.86 08/06/2012 0549   CALCIUM 9.4 08/06/2012 0549   PROT 5.1* 07/17/2012 1110   ALBUMIN 1.2* 07/17/2012 1110   AST 14 07/17/2012 1110   ALT 12 07/17/2012 1110   ALKPHOS 157* 07/17/2012 1110   BILITOT 0.3 07/17/2012 1110   GFRNONAA 66* 08/06/2012 0549   GFRAA 77* 08/06/2012 0549     Recent Labs  08/05/12 1417  HGBA1C 5.3   Coags:  Recent Labs  08/07/12 0500 08/08/12 0609  INR 2.81* 2.77*   Microbiology: Recent Results (from the past 240 hour(s))  CULTURE, BLOOD (ROUTINE X 2)     Status: None   Collection Time    08/05/12 11:40 AM      Result Value Range Status   Specimen Description BLOOD RIGHT ARM   Final   Special Requests BOTTLES DRAWN AEROBIC AND ANAEROBIC B 10CC R 6CC    Final   Culture  Setup Time 08/05/2012 21:45   Final   Culture     Final   Value:        BLOOD CULTURE RECEIVED NO GROWTH TO DATE CULTURE WILL BE HELD FOR 5 DAYS BEFORE ISSUING A FINAL NEGATIVE REPORT   Report Status PENDING   Incomplete  CULTURE, BLOOD (ROUTINE X 2)     Status: None  Collection Time    08/05/12  2:20 PM      Result Value Range Status   Specimen Description BLOOD RIGHT ARM   Final   Special Requests BOTTLES DRAWN AEROBIC ONLY 8CC   Final   Culture  Setup Time 08/05/2012 21:45   Final   Culture     Final   Value:        BLOOD CULTURE RECEIVED NO GROWTH TO DATE CULTURE WILL BE HELD FOR 5 DAYS BEFORE ISSUING A FINAL NEGATIVE REPORT   Report Status PENDING   Incomplete  URINE CULTURE     Status: None   Collection Time    08/05/12  7:36 PM      Result Value Range Status   Specimen Description URINE, CLEAN CATCH   Final   Special Requests NONE   Final   Culture  Setup Time     Final   Value: 08/06/2012 02:07     Performed at [facility] Count     Final   Value: 30,000 COLONIES/ML     Performed at [facility]   Culture     Final   Value: PSEUDOMONAS AERUGINOSA     Performed at [facility]   Report Status 08/07/2012 FINAL   Final   Organism ID, Bacteria PSEUDOMONAS AERUGINOSA   Final     BRIEF HOSPITAL COURSE:   Active Problems:   Acute massive pulmonary embolism   Acute respiratory failure with hypoxia   DM2 (diabetes mellitus, type 2)   Long term (current) use of anticoagulants   Acute venous embolism and thrombosis of deep vessels of proximal lower extremity       TODAY-DAY OF DISCHARGE:  Subjective:   Sravya Grissom today has no headache,no chest abdominal pain,no new weakness tingling or numbness, feels much better wants to go home today.   Objective:   Blood pressure 113/77, pulse 80, temperature 98.4 F (36.9 C), temperature source Oral, resp. rate 16, height 5\' 8"  (1.727 m), weight 93.9 kg (207 lb 0.2 oz), SpO2  97.00%.  Intake/Output Summary (Last 24 hours) at 08/08/12 1140 Last data filed at 08/08/12 0837  Gross per 24 hour  Intake    600 ml  Output    650 ml  Net    -50 ml   Filed Weights   08/06/12 1745 08/07/12 0553 08/08/12 0530  Weight: 92.625 kg (204 lb 3.2 oz) 92.4 kg (203 lb 11.3 oz) 93.9 kg (207 lb 0.2 oz)    Exam Awake Alert, Oriented *3, No new F.N deficits, Normal affect [place].AT,PERRAL Supple Neck,No JVD, No cervical lymphadenopathy appreciated.  Symmetrical Chest wall movement, Good air movement bilaterally, CTAB RRR,No Gallops,Rubs or new Murmurs, No Parasternal Heave Positive B.Sounds, Abd Soft, Non tender, No organomegaly appreciated, No rebound -guarding or rigidity. No Cyanosis, Clubbing or edema, No new Rash or bruise  DISCHARGE CONDITION: Stable  DISPOSITION: Home with home health services  DISCHARGE INSTRUCTIONS:    Activity:  As tolerated with Full fall precautions use walker/cane & assistance as needed  Diet recommendation:  Heart Healthy diet   Discharge Orders   Future Appointments Provider Department Dept Phone   08/16/2012 10:00 AM [name] [phone]   08/23/2012 9:00 AM [name], MD [facility] [phone]   08/29/2012 3:30 PM Leslye Peer, MD  P[facility] Care [phone]   Future Orders Complete By Expires     Diet - low  sodium heart healthy  As directed     Comments:      Warfarin Diet.  Limit greens    Increase activity slowly  As directed        Follow-up Information   Follow up with [name] On 08/09/2012. (12:15   for pt/inr check)    Contact information:   [address], [address] [place] [address] [phone]      Follow up with [facility] S, MD. Schedule an appointment as soon as possible for a visit in 1 week. (OR SEE ON 8/7 AS PREVIOUSLY SCHEDULED.)    Contact information:   [address] [place] [address] [phone]         Total Time spent on discharge equals 45 minutes.  Signed: Michae Kava, PA-S] Algis Downs, PA-C [facility] 08/08/2012 11:40 AM

## 2012-08-08 NOTE — Progress Notes (Signed)
ANTICOAGULATION CONSULT NOTE - Follow Up Consult  Pharmacy Consult for Coumadin Indication: recent DVT/PE s/p IVC filter  Allergies  Allergen Reactions  . Augmentin (Amoxicillin-Pot Clavulanate) Other (See Comments)    Severe vaginal itching    Patient Measurements: Height: 5\' 8"  (172.7 cm) Weight: 207 lb 0.2 oz (93.9 kg) IBW/kg (Calculated) : 63.9 Heparin Dosing Weight:   Vital Signs: Temp: 98.4 F (36.9 C) (08/06 0530) Temp src: Oral (08/06 0530) BP: 113/77 mmHg (08/06 1007) Pulse Rate: 80 (08/06 1007)  Labs:  Recent Labs  08/05/12 1318 08/05/12 1803 08/05/12 2330 08/06/12 0549 08/07/12 0500 08/08/12 0609  HGB  --   --   --  10.4* 10.7*  --   HCT  --   --   --  32.1* 34.3*  --   PLT  --   --   --  369 344  --   LABPROT  --   --   --  27.1* 28.6* 28.3*  INR  --   --   --  2.62* 2.81* 2.77*  CREATININE  --   --   --  0.86  --   --   TROPONINI <0.30 <0.30 <0.30  --   --   --     Estimated Creatinine Clearance: 71.9 ml/min (by C-G formula based on Cr of 0.86).  Assessment: 72yof continues on Coumadin for recent DVT/PE s/p IVC filter. INR (2.77) is  therapeutic. Per Coumadin Clinic notes, goal INR is 2.5 - 3.5 due to pt had PE on therapeutic INR 2.17 in 7/14 and most recent Coumadin regimen recommendations were 5mg  daily except 2.5mg  TTS. Please note, patient was on Fluconazole x2 doses now stopped.  ABX changed to Cipro for pseudomonas UTI.   - H/H and Plts slight trend down now stable - No significant bleeding reported -plan to d/c home today - [facility] RN to come to pt home tomorrow to draw INR and fax to [facility] for dosing instructions [phone]  Goal of Therapy:  INR 2.5 - 3.5   Plan:  1. Coumadin 5mg  MWFSu, 2.5mg  TTSa   Leota Sauers Pharm.D. CPP, BCPS Clinical Pharmacist [phone] 08/08/2012 11:07 AM

## 2012-08-08 NOTE — Discharge Summary (Signed)
Addendum  Patient seen and examined, chart and data base reviewed.  I agree with the above assessment and plan.  For full details please see Mrs. Algis Downs PA note.  SOB, likely secondary to recent bilateral saddle embolus.  Needs appropriate age screening, for hypercoagulable status including colonoscopy and mammography.  Pseudomonas aeruginosa UTI, discharged on ciprofloxacin.   Clint Lipps, MD [facility] Pager: [phone] 08/08/2012, 1:10 PM

## 2012-08-08 NOTE — Progress Notes (Signed)
NURSING PROGRESS NOTE  Hannah [name] [id number] Discharge Data: 08/08/2012 11:58 AM Attending Provider: Clydia Llano, MD [id number],HQIO S, MD     [name] to be D/C'd Home per MD order.  Discussed with the patient the After Visit Summary and all questions fully answered. All IV's discontinued with no bleeding noted. All belongings returned to patient for patient to take home.   Last Vital Signs:  Blood pressure 113/77, pulse 80, temperature 98.4 F (36.9 C), temperature source Oral, resp. rate 16, height 5\' 8"  (1.727 m), weight 93.9 kg (207 lb 0.2 oz), SpO2 97.00%.  Discharge Medication List   Medication List    STOP taking these medications       amoxicillin-clavulanate 875-125 MG per tablet  Commonly known as:  AUGMENTIN     enoxaparin 100 MG/ML injection  Commonly known as:  LOVENOX      TAKE these medications       albuterol (5 MG/ML) 0.5% nebulizer solution  Commonly known as:  PROVENTIL  Take 0.5 mLs (2.5 mg total) by nebulization every 3 (three) hours as needed for wheezing or shortness of breath.     budesonide 0.25 MG/2ML nebulizer solution  Commonly known as:  PULMICORT  Take 2 mLs (0.25 mg total) by nebulization 2 (two) times daily.     cholecalciferol 1000 UNITS tablet  Commonly known as:  VITAMIN D  Take 1,000 Units by mouth daily.     ciprofloxacin 250 MG tablet  Commonly known as:  CIPRO  Take 1 tablet (250 mg total) by mouth 2 (two) times daily.     glipiZIDE 5 MG tablet  Commonly known as:  GLUCOTROL  Take 1 tablet (5 mg total) by mouth daily before breakfast.     ipratropium 0.02 % nebulizer solution  Commonly known as:  ATROVENT  Take 0.5 mg by nebulization every 6 (six) hours as needed (for shortness of breath).     levothyroxine 50 MCG tablet  Commonly known as:  SYNTHROID, LEVOTHROID  Take 50 mcg by mouth daily before breakfast.     metFORMIN 500 MG tablet  Commonly known as:  GLUCOPHAGE  Take 500 mg by mouth 2 (two) times daily  with a meal.     metoprolol tartrate 25 MG tablet  Commonly known as:  LOPRESSOR  Take 25 mg by mouth 2 (two) times daily.     simvastatin 20 MG tablet  Commonly known as:  ZOCOR  Take 20 mg by mouth at bedtime.     trolamine salicylate 10 % cream  Commonly known as:  ASPERCREME  Apply 1 application topically daily as needed (for arthritis).     vitamin C 500 MG tablet  Commonly known as:  ASCORBIC ACID  Take 500 mg by mouth daily.     warfarin 5 MG tablet  Commonly known as:  COUMADIN  Take 0.5-1 tablets (2.5-5 mg total) by mouth daily. 5mg  daily except 2.5mg  TTS (per Coumadin Clinic notes)

## 2012-08-09 ENCOUNTER — Ambulatory Visit (INDEPENDENT_AMBULATORY_CARE_PROVIDER_SITE_OTHER): Payer: Medicare Other | Admitting: Cardiology

## 2012-08-09 DIAGNOSIS — Z7901 Long term (current) use of anticoagulants: Secondary | ICD-10-CM

## 2012-08-09 DIAGNOSIS — I2699 Other pulmonary embolism without acute cor pulmonale: Secondary | ICD-10-CM

## 2012-08-11 LAB — CULTURE, BLOOD (ROUTINE X 2): Culture: NO GROWTH

## 2012-08-13 ENCOUNTER — Other Ambulatory Visit: Payer: Self-pay | Admitting: Cardiovascular Disease

## 2012-08-14 ENCOUNTER — Ambulatory Visit (INDEPENDENT_AMBULATORY_CARE_PROVIDER_SITE_OTHER): Payer: Medicare Other | Admitting: Cardiology

## 2012-08-14 DIAGNOSIS — I2699 Other pulmonary embolism without acute cor pulmonale: Secondary | ICD-10-CM

## 2012-08-14 DIAGNOSIS — Z7901 Long term (current) use of anticoagulants: Secondary | ICD-10-CM

## 2012-08-14 LAB — POCT INR: INR: 3.1

## 2012-08-16 ENCOUNTER — Encounter (INDEPENDENT_AMBULATORY_CARE_PROVIDER_SITE_OTHER): Payer: Medicare Other | Admitting: *Deleted

## 2012-08-16 DIAGNOSIS — I2699 Other pulmonary embolism without acute cor pulmonale: Secondary | ICD-10-CM

## 2012-08-16 DIAGNOSIS — Z86718 Personal history of other venous thrombosis and embolism: Secondary | ICD-10-CM

## 2012-08-16 DIAGNOSIS — Z01810 Encounter for preprocedural cardiovascular examination: Secondary | ICD-10-CM

## 2012-08-16 DIAGNOSIS — I82403 Acute embolism and thrombosis of unspecified deep veins of lower extremity, bilateral: Secondary | ICD-10-CM

## 2012-08-16 NOTE — Discharge Summary (Signed)
sttaff attestation  Dr. Kalman Shan, M.D., [facility].C.P Pulmonary and Critical Care Medicine Staff Physician [facility] Pager: [phone], If no answer or between  15:00h - 7:00h: call [phone]  08/16/2012 9:52 PM

## 2012-08-21 ENCOUNTER — Ambulatory Visit (INDEPENDENT_AMBULATORY_CARE_PROVIDER_SITE_OTHER): Payer: Medicare Other | Admitting: Pharmacist

## 2012-08-21 DIAGNOSIS — Z7901 Long term (current) use of anticoagulants: Secondary | ICD-10-CM

## 2012-08-21 DIAGNOSIS — I2699 Other pulmonary embolism without acute cor pulmonale: Secondary | ICD-10-CM

## 2012-08-21 LAB — POCT INR: INR: 2.5

## 2012-08-22 ENCOUNTER — Encounter: Payer: Self-pay | Admitting: Vascular Surgery

## 2012-08-23 ENCOUNTER — Ambulatory Visit (INDEPENDENT_AMBULATORY_CARE_PROVIDER_SITE_OTHER): Payer: Medicare Other | Admitting: Vascular Surgery

## 2012-08-23 ENCOUNTER — Other Ambulatory Visit: Payer: Self-pay | Admitting: Cardiovascular Disease

## 2012-08-23 ENCOUNTER — Encounter: Payer: Self-pay | Admitting: Vascular Surgery

## 2012-08-23 VITALS — BP 122/66 | HR 69 | Ht 68.0 in | Wt 202.1 lb

## 2012-08-23 DIAGNOSIS — I82403 Acute embolism and thrombosis of unspecified deep veins of lower extremity, bilateral: Secondary | ICD-10-CM

## 2012-08-23 DIAGNOSIS — I82409 Acute embolism and thrombosis of unspecified deep veins of unspecified lower extremity: Secondary | ICD-10-CM

## 2012-08-23 NOTE — Progress Notes (Signed)
Patient is a 72 year old female who had an inferior vena cava filter placed several months ago after bilateral DVTs and a significant pulmonary embolus. She is currently on Coumadin. She returns today for consideration of filter removal. Primary risk factors for recurrent embolus include chronic DVT obesity and unknown cause of hypercoagulable state. She was recently seen by Dr. Vassie Loll from pulmonology. He thought her risk factors were significant enough that she should keep her IVC filter. The patient currently denies any leg swelling or shortness of breath.  Review of systems: She denies shortness of breath. She denies chest pain. She denies any syncopal episodes.  Physical exam:  Filed Vitals:   08/23/12 0914  BP: 122/66  Pulse: 69  Height: 5\' 8"  (1.727 m)  Weight: 202 lb 1.6 oz (91.672 kg)  SpO2: 100%   Extremities: No significant edema bilaterally Chest: Clear to auscultation no wheeze Cardiac: Regular rate and rhythm  Data: The patient had a duplex ultrasound of her lower extremities last week. This showed chronic DVT especially in the right external iliac vein as well as bilateral lower extremity veins.  Assessment: Since the patient has significant pulmonary embolus and she still has evidence of residual chronic DVT I believe her risk is significant in that removal of the filter would be unwise. I agree with Dr. elbow on this. We will plan on making the filter permanent at this point. She will followup with me on as-needed basis. Risks of leaving the filter in place were discussed with the patient and her daughter today including primarily filter thrombosis. They understand and agree that her risk of filter thrombosis is probably lower than significant pulmonary events.  Fabienne Bruns, MD [facility] Office: [phone] Pager: [phone]

## 2012-08-27 ENCOUNTER — Ambulatory Visit (INDEPENDENT_AMBULATORY_CARE_PROVIDER_SITE_OTHER): Payer: Medicare Other | Admitting: Internal Medicine

## 2012-08-27 DIAGNOSIS — I2699 Other pulmonary embolism without acute cor pulmonale: Secondary | ICD-10-CM

## 2012-08-27 DIAGNOSIS — Z7901 Long term (current) use of anticoagulants: Secondary | ICD-10-CM

## 2012-08-27 LAB — POCT INR: INR: 2.6

## 2012-08-29 ENCOUNTER — Ambulatory Visit (INDEPENDENT_AMBULATORY_CARE_PROVIDER_SITE_OTHER)
Admission: RE | Admit: 2012-08-29 | Discharge: 2012-08-29 | Disposition: A | Discharge status: Home / Self Care | Payer: Medicare Other | Source: Ambulatory Visit | Attending: Emergency Medicine | Admitting: Emergency Medicine

## 2012-08-29 ENCOUNTER — Ambulatory Visit (INDEPENDENT_AMBULATORY_CARE_PROVIDER_SITE_OTHER): Payer: Medicare Other | Admitting: Emergency Medicine

## 2012-08-29 ENCOUNTER — Encounter: Payer: Self-pay | Admitting: Emergency Medicine

## 2012-08-29 ENCOUNTER — Other Ambulatory Visit: Payer: Self-pay | Admitting: Cardiovascular Disease

## 2012-08-29 VITALS — BP 130/70 | HR 76 | Temp 97.0°F | Ht 68.0 in | Wt 204.8 lb

## 2012-08-29 DIAGNOSIS — I2699 Other pulmonary embolism without acute cor pulmonale: Secondary | ICD-10-CM

## 2012-08-29 DIAGNOSIS — N6320 Unspecified lump in the left breast, unspecified quadrant: Secondary | ICD-10-CM

## 2012-08-29 NOTE — Patient Instructions (Addendum)
Stop Pulmicort nebulizers Continue to have albuterol available to use if needed for shortness of breath.  Please continue your coumadin, to be adjusted at [facility] We will perform breathing testing at some point on the future.

## 2012-08-29 NOTE — Assessment & Plan Note (Signed)
Not clear that she needs to be on pulmicort She will need to be on coumadin for life since they are keeping the IVC filter.  Will f/u w her in 4 months and decide whether she needs PFT

## 2012-08-29 NOTE — Progress Notes (Signed)
  Subjective:    Patient ID: Hannah [name], female    DOB: 1940-01-13, 72 y.o.   MRN: [id number]  HPI 72 year old female, former smoker 10 pk-yrs, seen for initial pulmonary consult for bilateral pulmonary embolism. 07/13/2012  post hospital followup 07/31/12 --  Patient returns for a post hospital followup. Patient was admitted July 11 through July 24 for an extensive bilateral pulmonary embolism. Patient developed acute shortness of breath. Prior to admission. Patient presented to the emergency room and underwent a CT scan that showed a central and obstructive bilateral pulmonary was a with right heart strain. Patient did have personally have a PEA arrest in the emergency room requiring 3, rounds of CPR. Patient was given emergent TNK . Patient did experience cardiac and respiratory arrest. She required mechanical ventilation in the ICU.unfortunately patient experienced a second drop in her hemoglobin down to 4. Her heparin was stopped and protamine was administered. Patient did require a packed red blood cell transfusion. CT abdomen and pelvis was negative, except for an intramuscular hematoma on the right. Patient will underwent an IVC filter placement. Venous Doppler was positive for a right leg DVT Patient's hemoglobin improved, and patient was transitioned back over to anticoagulation. On July 17. With a bridge of Lovenox, and warfarin. Patient was discharged with Coumadin. Clinic, following her. Patient's last INR yesterday was 4.0. Her Coumadin was adjusted. Patient denies any known bleeding. She denies any hemoptysis, orthopnea, PND, or leg swelling. Has noticed over the last few days, and her cough has worsened and has been getting some brown, yellow and clear mucus up at times. She denies any fever.  ROV 08/29/12 -- f/u visit for PE's, associated R heart strain. Associated w RLL atx / infiltrate. Repeat CXR today shows persistent mild atx. She is on coumadin. She was hospitalized again for what  sounds like UA irritation. Her coumadin is to be managed at [facility]. Repeat TTE 8/4 shows improvement in PASP compared with 7/11. CT scan 08/05/12 with subcutaneous chest wall nodules, no bx done to date, deferred.    Review of Systems     Objective:   Physical Exam Filed Vitals:   08/29/12 1550  BP: 130/70  Pulse: 76  Temp: 97 F (36.1 C)  TempSrc: Oral  Height: 5\' 8"  (1.727 m)  Weight: 204 lb 12.8 oz (92.897 kg)  SpO2: 99%   GEN: A/Ox3; pleasant , NAD  HEENT:  [place]/AT,  EACs-clear, TMs-wnl, NOSE-clear, THROAT-clear, no lesions, no postnasal drip or exudate noted.   NECK:  Supple w/ fair ROM; no JVD; normal carotid impulses w/o bruits; no thyromegaly or nodules palpated; no lymphadenopathy.  RESP  Clear  P & A; w/o, wheezes/ rales/ or rhonchi.no accessory muscle use, no dullness to percussion  CARD:  RRR, no m/r/g  , tr peripheral edema, pulses intact, no cyanosis or clubbing.  GI:   Soft & nt; nml bowel sounds; no organomegaly or masses detected.  Musco: Warm bil, no deformities or joint swelling noted.   Neuro: alert, no focal deficits noted.    Skin: Warm, no lesions or rashes     Assessment & Plan:  Acute massive pulmonary embolism Not clear that she needs to be on pulmicort She will need to be on coumadin for life since they are keeping the IVC filter.  Will f/u w her in 4 months and decide whether she needs PFT

## 2012-09-04 ENCOUNTER — Ambulatory Visit (INDEPENDENT_AMBULATORY_CARE_PROVIDER_SITE_OTHER): Payer: Medicare Other

## 2012-09-04 DIAGNOSIS — Z7901 Long term (current) use of anticoagulants: Secondary | ICD-10-CM

## 2012-09-04 DIAGNOSIS — I2699 Other pulmonary embolism without acute cor pulmonale: Secondary | ICD-10-CM

## 2012-09-04 LAB — POCT INR: INR: 1.8

## 2012-09-11 ENCOUNTER — Ambulatory Visit (INDEPENDENT_AMBULATORY_CARE_PROVIDER_SITE_OTHER): Payer: Medicare Other | Admitting: *Deleted

## 2012-09-11 DIAGNOSIS — I2699 Other pulmonary embolism without acute cor pulmonale: Secondary | ICD-10-CM

## 2012-09-11 DIAGNOSIS — Z7901 Long term (current) use of anticoagulants: Secondary | ICD-10-CM

## 2012-09-11 LAB — POCT INR: INR: 3

## 2012-09-18 ENCOUNTER — Ambulatory Visit
Admission: RE | Admit: 2012-09-18 | Discharge: 2012-09-18 | Disposition: A | Discharge status: Home / Self Care | Payer: Medicare Other | Source: Ambulatory Visit | Attending: Cardiovascular Disease | Admitting: Cardiovascular Disease

## 2012-09-18 DIAGNOSIS — N6320 Unspecified lump in the left breast, unspecified quadrant: Secondary | ICD-10-CM

## 2012-09-21 ENCOUNTER — Other Ambulatory Visit: Payer: Self-pay | Admitting: Cardiovascular Disease

## 2012-09-21 DIAGNOSIS — Z1231 Encounter for screening mammogram for malignant neoplasm of breast: Secondary | ICD-10-CM

## 2012-09-25 ENCOUNTER — Ambulatory Visit (INDEPENDENT_AMBULATORY_CARE_PROVIDER_SITE_OTHER): Payer: Medicare Other

## 2012-09-25 DIAGNOSIS — I2699 Other pulmonary embolism without acute cor pulmonale: Secondary | ICD-10-CM

## 2012-09-25 DIAGNOSIS — Z7901 Long term (current) use of anticoagulants: Secondary | ICD-10-CM

## 2012-10-09 ENCOUNTER — Ambulatory Visit (INDEPENDENT_AMBULATORY_CARE_PROVIDER_SITE_OTHER): Payer: Medicare Other | Admitting: *Deleted

## 2012-10-09 DIAGNOSIS — Z7901 Long term (current) use of anticoagulants: Secondary | ICD-10-CM

## 2012-10-09 DIAGNOSIS — I2699 Other pulmonary embolism without acute cor pulmonale: Secondary | ICD-10-CM

## 2012-10-12 ENCOUNTER — Ambulatory Visit
Admission: RE | Admit: 2012-10-12 | Discharge: 2012-10-12 | Disposition: A | Discharge status: Home / Self Care | Payer: Medicare Other | Source: Ambulatory Visit | Attending: Cardiovascular Disease | Admitting: Cardiovascular Disease

## 2012-10-12 DIAGNOSIS — Z1231 Encounter for screening mammogram for malignant neoplasm of breast: Secondary | ICD-10-CM

## 2012-10-16 ENCOUNTER — Other Ambulatory Visit: Payer: Self-pay | Admitting: Cardiovascular Disease

## 2012-10-16 DIAGNOSIS — R928 Other abnormal and inconclusive findings on diagnostic imaging of breast: Secondary | ICD-10-CM

## 2012-10-30 ENCOUNTER — Other Ambulatory Visit: Payer: Medicare Other

## 2012-10-30 ENCOUNTER — Ambulatory Visit (INDEPENDENT_AMBULATORY_CARE_PROVIDER_SITE_OTHER): Payer: Medicare Other | Admitting: *Deleted

## 2012-10-30 DIAGNOSIS — I2699 Other pulmonary embolism without acute cor pulmonale: Secondary | ICD-10-CM

## 2012-10-30 DIAGNOSIS — Z7901 Long term (current) use of anticoagulants: Secondary | ICD-10-CM

## 2012-11-08 ENCOUNTER — Other Ambulatory Visit: Payer: Self-pay

## 2012-11-09 ENCOUNTER — Other Ambulatory Visit: Payer: Self-pay | Admitting: Cardiovascular Disease

## 2012-11-09 ENCOUNTER — Ambulatory Visit
Admission: RE | Admit: 2012-11-09 | Discharge: 2012-11-09 | Disposition: A | Discharge status: Home / Self Care | Payer: Medicare Other | Source: Ambulatory Visit | Attending: Cardiovascular Disease | Admitting: Cardiovascular Disease

## 2012-11-09 DIAGNOSIS — R928 Other abnormal and inconclusive findings on diagnostic imaging of breast: Secondary | ICD-10-CM

## 2012-11-13 ENCOUNTER — Ambulatory Visit
Admission: RE | Admit: 2012-11-13 | Discharge: 2012-11-13 | Disposition: A | Discharge status: Home / Self Care | Payer: Medicare Other | Source: Ambulatory Visit | Attending: Cardiovascular Disease | Admitting: Cardiovascular Disease

## 2012-11-13 DIAGNOSIS — R928 Other abnormal and inconclusive findings on diagnostic imaging of breast: Secondary | ICD-10-CM

## 2012-11-15 ENCOUNTER — Ambulatory Visit (INDEPENDENT_AMBULATORY_CARE_PROVIDER_SITE_OTHER): Payer: Medicare Other | Admitting: Pharmacist

## 2012-11-15 DIAGNOSIS — Z7901 Long term (current) use of anticoagulants: Secondary | ICD-10-CM

## 2012-11-15 DIAGNOSIS — I824Y3 Acute embolism and thrombosis of unspecified deep veins of proximal lower extremity, bilateral: Secondary | ICD-10-CM

## 2012-11-15 DIAGNOSIS — I2699 Other pulmonary embolism without acute cor pulmonale: Secondary | ICD-10-CM

## 2012-11-15 DIAGNOSIS — I824Y9 Acute embolism and thrombosis of unspecified deep veins of unspecified proximal lower extremity: Secondary | ICD-10-CM

## 2012-11-15 LAB — POCT INR: INR: 2.7

## 2012-12-06 ENCOUNTER — Ambulatory Visit (INDEPENDENT_AMBULATORY_CARE_PROVIDER_SITE_OTHER): Payer: Medicare Other | Admitting: Pharmacist

## 2012-12-06 DIAGNOSIS — I2699 Other pulmonary embolism without acute cor pulmonale: Secondary | ICD-10-CM

## 2012-12-06 DIAGNOSIS — Z7901 Long term (current) use of anticoagulants: Secondary | ICD-10-CM

## 2012-12-06 LAB — POCT INR: INR: 2.5

## 2012-12-31 ENCOUNTER — Telehealth: Payer: Self-pay | Admitting: Pharmacist

## 2012-12-31 NOTE — Telephone Encounter (Signed)
New problem    Pt's daughter called for advice on pt.   Pt did not take her Warfaine Last night and took it this morning.  Should she do anything different?  Give daughter a call please.

## 2012-12-31 NOTE — Telephone Encounter (Signed)
Spoke with pt's daughter.  Advised to continue current dose of Coumadin and follow up as previously planned for 1/5

## 2013-01-07 ENCOUNTER — Ambulatory Visit (INDEPENDENT_AMBULATORY_CARE_PROVIDER_SITE_OTHER): Payer: Medicare Other | Admitting: Pharmacist

## 2013-01-07 DIAGNOSIS — Z7901 Long term (current) use of anticoagulants: Secondary | ICD-10-CM

## 2013-01-07 DIAGNOSIS — I2699 Other pulmonary embolism without acute cor pulmonale: Secondary | ICD-10-CM

## 2013-01-07 LAB — POCT INR: INR: 2.3

## 2013-01-15 ENCOUNTER — Ambulatory Visit: Payer: Medicare Other | Admitting: Cardiology

## 2013-02-04 ENCOUNTER — Ambulatory Visit (INDEPENDENT_AMBULATORY_CARE_PROVIDER_SITE_OTHER): Payer: Medicare Other | Admitting: *Deleted

## 2013-02-04 DIAGNOSIS — Z7901 Long term (current) use of anticoagulants: Secondary | ICD-10-CM

## 2013-02-04 DIAGNOSIS — I2699 Other pulmonary embolism without acute cor pulmonale: Secondary | ICD-10-CM

## 2013-02-04 DIAGNOSIS — Z5181 Encounter for therapeutic drug level monitoring: Secondary | ICD-10-CM

## 2013-02-04 DIAGNOSIS — I824Y9 Acute embolism and thrombosis of unspecified deep veins of unspecified proximal lower extremity: Secondary | ICD-10-CM

## 2013-02-04 LAB — POCT INR: INR: 2.6

## 2013-02-25 ENCOUNTER — Encounter: Payer: Self-pay | Admitting: Family Medicine

## 2013-02-25 ENCOUNTER — Ambulatory Visit (INDEPENDENT_AMBULATORY_CARE_PROVIDER_SITE_OTHER): Payer: Medicare Other | Admitting: Family Medicine

## 2013-02-25 VITALS — BP 120/78 | Temp 97.7°F | Ht 65.5 in | Wt 210.0 lb

## 2013-02-25 DIAGNOSIS — Z7901 Long term (current) use of anticoagulants: Secondary | ICD-10-CM

## 2013-02-25 DIAGNOSIS — E039 Hypothyroidism, unspecified: Secondary | ICD-10-CM

## 2013-02-25 DIAGNOSIS — R570 Cardiogenic shock: Secondary | ICD-10-CM

## 2013-02-25 DIAGNOSIS — I824Y9 Acute embolism and thrombosis of unspecified deep veins of unspecified proximal lower extremity: Secondary | ICD-10-CM

## 2013-02-25 DIAGNOSIS — I82409 Acute embolism and thrombosis of unspecified deep veins of unspecified lower extremity: Secondary | ICD-10-CM

## 2013-02-25 DIAGNOSIS — E785 Hyperlipidemia, unspecified: Secondary | ICD-10-CM

## 2013-02-25 DIAGNOSIS — I10 Essential (primary) hypertension: Secondary | ICD-10-CM

## 2013-02-25 DIAGNOSIS — E1129 Type 2 diabetes mellitus with other diabetic kidney complication: Secondary | ICD-10-CM

## 2013-02-25 HISTORY — DX: Hyperlipidemia, unspecified: E78.5

## 2013-02-25 LAB — TSH: TSH: 0.51 u[IU]/mL (ref 0.35–5.50)

## 2013-02-25 LAB — LIPID PANEL
Cholesterol: 136 mg/dL (ref 0–200)
HDL: 52.6 mg/dL (ref >39.00)
LDL CALC: 66 mg/dL (ref 0–99)
Total CHOL/HDL Ratio: 3
Triglycerides: 88 mg/dL (ref 0.0–149.0)
VLDL: 17.6 mg/dL (ref 0.0–40.0)

## 2013-02-25 LAB — MICROALBUMIN / CREATININE URINE RATIO
Creatinine,U: 165.7 mg/dL
MICROALB UR: 2.5 mg/dL — AB (ref 0.0–1.9)
MICROALB/CREAT RATIO: 1.5 mg/g (ref 0.0–30.0)

## 2013-02-25 LAB — BASIC METABOLIC PANEL
BUN: 21 mg/dL (ref 6–23)
CALCIUM: 10 mg/dL (ref 8.4–10.5)
CO2: 24 mEq/L (ref 19–32)
CREATININE: 1 mg/dL (ref 0.4–1.2)
Chloride: 105 mEq/L (ref 96–112)
GFR: 69.33 mL/min (ref >60.00)
Glucose, Bld: 64 mg/dL — ABNORMAL LOW (ref 70–99)
POTASSIUM: 4.9 meq/L (ref 3.5–5.1)
Sodium: 140 mEq/L (ref 135–145)

## 2013-02-25 LAB — T4, FREE: FREE T4: 1.09 ng/dL (ref 0.60–1.60)

## 2013-02-25 LAB — HEMOGLOBIN A1C: Hgb A1c MFr Bld: 6.9 % — ABNORMAL HIGH (ref 4.6–6.5)

## 2013-02-25 MED ORDER — GLUCOSE BLOOD VI STRP: ORAL_STRIP | Status: DC

## 2013-02-25 NOTE — Patient Instructions (Addendum)
-  follow up with your pulmonologist  -We placed a referral for you as discussed to the cardiologist as requested. It usually takes about 1-2 weeks to process and schedule this referral. If you have not heard from [place] regarding this appointment in 2 weeks please contact our office.  -We have ordered labs or studies at this visit. It can take up to 1-2 weeks for results and processing. We will contact you with instructions IF your results are abnormal. Normal results will be released to your [facility]. If you have not heard from [place] or can not find your results in [facility] in 2 weeks please contact our office.  -PLEASE SIGN UP FOR MYCHART TODAY   We recommend the following healthy lifestyle measures: - eat a healthy diet consisting of lots of vegetables, fruits, beans, nuts, seeds, healthy meats such as white chicken and fish and whole grains.  - avoid fried foods, fast food, processed foods, sodas, red meet and other fattening foods.  - get a least 150 minutes of aerobic exercise per week.   Please see a lawyer and/or go to this website to help you with advanced directives and designating a health care power of attorney so that your wishes will be followed should you become too ill to make your own medical decisions.  [email or web address]  Follow up in: 3 months

## 2013-02-25 NOTE — Progress Notes (Addendum)
Chief Complaint  Patient presents with  . Establish Care    HPI:  Hannah [name] is here to establish care. Used to see Dr. Algie CofferKadakia in cardiology but he no longer takes her insurance. She reports she needs a referral to a new cardiologist. She reports Dr. Algie CofferKadakia used to do all of her diabetes, heart issues and thyroid, but has not checked any labs in 8 months even though she saw him. Denies CP, SOB, palpitations, swelling, fever, racing heart issues with her diabetes.  Last PCP and physical:   Has the following chronic problems and concerns today:  Patient Active Problem List   Diagnosis Date Noted  . Essential hypertension, benign 02/25/2013  . Hyperlipemia 02/25/2013  . DVT (deep venous thrombosis) 08/23/2012  . Long term (current) use of anticoagulants 07/27/2012  . DM2 (diabetes mellitus, type 2) 07/23/2012  . Acute massive pulmonary embolism 07/13/2012   Hx of chronic and recurrent DVT, pulm embolism,  Cardiogenic shock, followed by pulm (Dr. Alva/Dr. Delton CoombesByrum)) and vascular (Dr. Darrick PennaFields) and cards ( on chronic coumadin therapy and with chronic IVC filter. Had mammogram. Reports GI (Dr. Fabiola BackerMann)would not do colonoscopy due to risks.  ROS: See pertinent positives and negatives per HPI.  Past Medical History  Diagnosis Date  . Thyroid disease   . Diabetes mellitus without complication   . DVT (deep venous thrombosis)   . Acute massive pulmonary embolism 07/13/2012    Massive PE w/ PEA arrest 07/13/12 >TNK >IVC filter >discharged on comadin    . Cardiogenic shock 07/13/2012  . Hemorrhagic shock 07/15/2012  . Acute renal failure 07/14/2012  . Acute respiratory failure with hypoxia 07/14/2012    Family History  Problem Relation Age of Onset  . Family history unknown: Yes    History   Social History  . Marital Status: Widowed    Spouse Name: N/A    Number of Children: N/A  . Years of Education: N/A   Social History Main Topics  . Smoking status: Former Smoker -- 1.00 packs/day  for 10 years    Types: Cigarettes    Quit date: 01/04/1968  . Smokeless tobacco: Never Used  . Alcohol Use: No  . Drug Use: No  . Sexual Activity: None   Other Topics Concern  . None   Social History Narrative   Lives alone.           Current outpatient prescriptions:albuterol (PROVENTIL) (5 MG/ML) 0.5% nebulizer solution, Take 0.5 mLs (2.5 mg total) by nebulization every 3 (three) hours as needed for wheezing or shortness of breath., Disp: 20 mL, Rfl: 12;  budesonide (PULMICORT) 0.25 MG/2ML nebulizer solution, Take 2 mLs (0.25 mg total) by nebulization 2 (two) times daily., Disp: 60 mL, Rfl: 12 cholecalciferol (VITAMIN D) 1000 UNITS tablet, Take 1,000 Units by mouth daily. , Disp: , Rfl: ;  glipiZIDE (GLUCOTROL) 5 MG tablet, Take 1 tablet (5 mg total) by mouth daily before breakfast., Disp: , Rfl: ;  glucose blood (ONETOUCH VERIO) test strip, Test daily., Disp: 100 each, Rfl: 12;  ipratropium (ATROVENT) 0.02 % nebulizer solution, Take 0.5 mg by nebulization every 6 (six) hours as needed (for shortness of breath)., Disp: , Rfl:  levothyroxine (SYNTHROID, LEVOTHROID) 50 MCG tablet, Take 50 mcg by mouth daily before breakfast., Disp: , Rfl: ;  metFORMIN (GLUCOPHAGE) 500 MG tablet, Take 500 mg by mouth 2 (two) times daily with a meal., Disp: , Rfl: ;  metoprolol tartrate (LOPRESSOR) 25 MG tablet, Take 25 mg by mouth 2 (  two) times daily., Disp: , Rfl: ;  simvastatin (ZOCOR) 20 MG tablet, Take 20 mg by mouth at bedtime., Disp: , Rfl:  vitamin C (ASCORBIC ACID) 500 MG tablet, Take 500 mg by mouth daily. , Disp: , Rfl: ;  warfarin (COUMADIN) 5 MG tablet, Take 0.5-1 tablets (2.5-5 mg total) by mouth daily. 5mg  daily except 2.5mg  TTS (per Coumadin Clinic notes), Disp: 30 tablet, Rfl: 12  EXAM:  Filed Vitals:   02/25/13 1110  BP: 120/78  Temp: 97.7 F (36.5 C)    Body mass index is 34.4 kg/(m^2).  GENERAL: vitals reviewed and listed above, alert, oriented, appears well hydrated and in no acute  distress  HEENT: atraumatic, conjunttiva clear, no obvious abnormalities on inspection of external nose and ears  NECK: no obvious masses on inspection  LUNGS: clear to auscultation bilaterally, no wheezes, rales or rhonchi, good air movement  CV: HRRR, no peripheral edema  MS: moves all extremities without noticeable abnormality  PSYCH: pleasant and cooperative, no obvious depression or anxiety  ASSESSMENT AND PLAN:  Discussed the following assessment and plan:  Cardiogenic shock - Plan: Ambulatory referral to Cardiology  Long term (current) use of anticoagulants - Plan: Ambulatory referral to Cardiology  Acute venous embolism and thrombosis of deep vessels of proximal lower extremity - Plan: Ambulatory referral to Cardiology  DVT (deep venous thrombosis) - Plan: Ambulatory referral to Cardiology  Diabetes mellitus with renal complications - Plan: Hemoglobin A1c, Microalbumin/Creatinine Ratio, Urine  Unspecified hypothyroidism - Plan: TSH, T4, Free  Essential hypertension, benign - Plan: Basic metabolic panel  Hyperlipemia - Plan: Lipid Panel  -We reviewed the PMH, PSH, FH, SH, Meds and Allergies. -We provided refills for any medications we will prescribe as needed. -We addressed current concerns per orders and patient instructions. -We have asked for records for pertinent exams, studies, vaccines and notes from previous providers. -We have advised patient to follow up per instructions below.   -Patient advised to return or notify a doctor immediately if symptoms worsen or persist or new concerns arise.  Patient Instructions  -follow up with your pulmonologist  -We placed a referral for you as discussed to the cardiologist as requested. It usually takes about 1-2 weeks to process and schedule this referral. If you have not heard from [place] regarding this appointment in 2 weeks please contact our office.  -We have ordered labs or studies at this visit. It can take up to  1-2 weeks for results and processing. We will contact you with instructions IF your results are abnormal. Normal results will be released to your [facility]. If you have not heard from [place] or can not find your results in [facility] in 2 weeks please contact our office.  -PLEASE SIGN UP FOR MYCHART TODAY   We recommend the following healthy lifestyle measures: - eat a healthy diet consisting of lots of vegetables, fruits, beans, nuts, seeds, healthy meats such as white chicken and fish and whole grains.  - avoid fried foods, fast food, processed foods, sodas, red meet and other fattening foods.  - get a least 150 minutes of aerobic exercise per week.   Please see a lawyer and/or go to this website to help you with advanced directives and designating a health care power of attorney so that your wishes will be followed should you become too ill to make your own medical decisions.  [email or web address]  Follow up in: 3 months         KIM, HANNAH R.

## 2013-02-25 NOTE — Progress Notes (Signed)
Pre visit review using our clinic review tool, if applicable. No additional management support is needed unless otherwise documented below in the visit note. 

## 2013-02-26 ENCOUNTER — Telehealth: Payer: Self-pay | Admitting: Family Medicine

## 2013-02-26 NOTE — Telephone Encounter (Signed)
Relevant patient education mailed to patient.  

## 2013-02-27 MED ORDER — LEVOTHYROXINE SODIUM 50 MCG PO TABS: 50.0000 ug | ORAL_TABLET | Freq: Every day | ORAL | Status: DC

## 2013-02-27 NOTE — Addendum Note (Signed)
Addended by: Azucena Freed on: 02/27/2013 03:03 PM   Modules accepted: Orders

## 2013-03-04 ENCOUNTER — Ambulatory Visit (INDEPENDENT_AMBULATORY_CARE_PROVIDER_SITE_OTHER): Payer: Medicare Other

## 2013-03-04 DIAGNOSIS — Z7901 Long term (current) use of anticoagulants: Secondary | ICD-10-CM

## 2013-03-04 DIAGNOSIS — I2699 Other pulmonary embolism without acute cor pulmonale: Secondary | ICD-10-CM

## 2013-03-04 LAB — POCT INR: INR: 2.6

## 2013-03-07 ENCOUNTER — Encounter: Payer: Self-pay | Admitting: Internal Medicine

## 2013-03-07 ENCOUNTER — Telehealth: Payer: Self-pay | Admitting: Internal Medicine

## 2013-03-07 ENCOUNTER — Ambulatory Visit (INDEPENDENT_AMBULATORY_CARE_PROVIDER_SITE_OTHER): Payer: Medicare Other | Admitting: Internal Medicine

## 2013-03-07 VITALS — BP 125/81 | HR 64 | Ht 65.5 in | Wt 214.8 lb

## 2013-03-07 DIAGNOSIS — I10 Essential (primary) hypertension: Secondary | ICD-10-CM

## 2013-03-07 NOTE — Telephone Encounter (Signed)
ROI faxed to [facility] Office [phone]

## 2013-03-07 NOTE — Patient Instructions (Signed)
Your physician recommends that you schedule a follow-up appointment in: TBD AFTER REVIEW OF RECORDS

## 2013-03-07 NOTE — Progress Notes (Signed)
HPI Patient is a 73 yo who was previously followed by Dr Algie CofferKadakia. The patient suffered a large PE this past summer .  Followed by pulmonary Readmitted in August with CP  No prgression. Last echo in August showed normal LV and RV systolic function  The patient denies CP  Denies dizziness.  Breathing has improved.    Allergies  Allergen Reactions  . Augmentin [Amoxicillin-Pot Clavulanate] Other (See Comments)    Severe vaginal itching    Current Outpatient Prescriptions  Medication Sig Dispense Refill  . albuterol (PROVENTIL) (5 MG/ML) 0.5% nebulizer solution Take 0.5 mLs (2.5 mg total) by nebulization every 3 (three) hours as needed for wheezing or shortness of breath.  20 mL  12  . budesonide (PULMICORT) 0.25 MG/2ML nebulizer solution Take 2 mLs (0.25 mg total) by nebulization 2 (two) times daily.  60 mL  12  . cholecalciferol (VITAMIN D) 1000 UNITS tablet Take 1,000 Units by mouth daily.       Marland Kitchen. glipiZIDE (GLUCOTROL) 5 MG tablet Take 1 tablet (5 mg total) by mouth daily before breakfast.      . glucose blood (ONETOUCH VERIO) test strip Test daily.  100 each  12  . ipratropium (ATROVENT) 0.02 % nebulizer solution Take 0.5 mg by nebulization every 6 (six) hours as needed (for shortness of breath).      Marland Kitchen. levothyroxine (SYNTHROID, LEVOTHROID) 50 MCG tablet Take 1 tablet (50 mcg total) by mouth daily before breakfast.  90 tablet  3  . metFORMIN (GLUCOPHAGE) 500 MG tablet Take 500 mg by mouth 2 (two) times daily with a meal.      . metoprolol tartrate (LOPRESSOR) 25 MG tablet Take 25 mg by mouth 2 (two) times daily.      . simvastatin (ZOCOR) 20 MG tablet Take 20 mg by mouth at bedtime.      . vitamin C (ASCORBIC ACID) 500 MG tablet Take 500 mg by mouth daily.       Marland Kitchen. warfarin (COUMADIN) 5 MG tablet Take 0.5-1 tablets (2.5-5 mg total) by mouth daily. 5mg  daily except 2.5mg  TTS (per [facility] Clinic notes)  30 tablet  12   No current facility-administered medications for this visit.    Past  Medical History  Diagnosis Date  . Thyroid disease   . Diabetes mellitus without complication   . DVT (deep venous thrombosis)   . Acute massive pulmonary embolism 07/13/2012    Massive PE w/ PEA arrest 07/13/12 >TNK >IVC filter >discharged on comadin    . Cardiogenic shock 07/13/2012  . Hemorrhagic shock 07/15/2012  . Acute renal failure 07/14/2012  . Acute respiratory failure with hypoxia 07/14/2012    No past surgical history on file.  No family history on file.  History   Social History  . Marital Status: Widowed    Spouse Name: N/A    Number of Children: N/A  . Years of Education: N/A   Occupational History  . Not on file.   Social History Main Topics  . Smoking status: Former Smoker -- 1.00 packs/day for 10 years    Types: Cigarettes    Quit date: 01/04/1968  . Smokeless tobacco: Never Used  . Alcohol Use: No  . Drug Use: No  . Sexual Activity: Not on file   Other Topics Concern  . Not on file   Social History Narrative   Lives alone.           Review of Systems:  All systems reviewed.  They are negative  to the above problem except as previously stated.  Vital Signs: BP 125/81  Pulse 64  Ht 5' 5.5" (1.664 m)  Wt 214 lb 12.8 oz (97.433 kg)  BMI 35.19 kg/m2  Physical Exam Patient is in AND HEENT:  Normocephalic, atraumatic. EOMI, PERRLA.  Neck: JVP is normal.  No bruits.  Lungs: clear to auscultation. No rales no wheezes.  Heart: Regular rate and rhythm. Normal S1, S2. No S3.   No significant murmurs. PMI not displaced.  Abdomen:  Supple, nontender. Normal bowel sounds. No masses. No hepatomegaly.  Extremities:   Good distal pulses throughout. No lower extremity edema.  Musculoskeletal :moving all extremities.  Neuro:   alert and oriented x3.  CN II-XII grossly intact.  EKG  SR 69  LVH  LAFB  Septal MI  Assessment and Plan:  1.  Pulm  Emboli  Patient to be on life long coumadin per her report.  Will follow year.ly  Will get records from Dr  Algie Coffer

## 2013-03-12 ENCOUNTER — Ambulatory Visit (INDEPENDENT_AMBULATORY_CARE_PROVIDER_SITE_OTHER): Payer: Medicare Other | Admitting: Emergency Medicine

## 2013-03-12 ENCOUNTER — Encounter: Payer: Self-pay | Admitting: Emergency Medicine

## 2013-03-12 VITALS — BP 172/92 | HR 78 | Ht 66.0 in | Wt 213.2 lb

## 2013-03-12 DIAGNOSIS — I2699 Other pulmonary embolism without acute cor pulmonale: Secondary | ICD-10-CM

## 2013-03-12 MED ORDER — ALBUTEROL SULFATE (5 MG/ML) 0.5% IN NEBU: 2.5000 mg | INHALATION_SOLUTION | RESPIRATORY_TRACT | Status: DC | PRN

## 2013-03-12 NOTE — Addendum Note (Signed)
Addended by: Gwynneth Aliment A on: 03/12/2013 04:37 PM   Modules accepted: Orders

## 2013-03-12 NOTE — Assessment & Plan Note (Signed)
She has been doing well. Tolerates warfarin and is therapeutic. She will need anticoag for life. Discussed with her today alternative anticoag. She will think about this. I offered to change her if she wants.

## 2013-03-12 NOTE — Progress Notes (Signed)
Subjective:    Patient ID: Hannah ManchesterDaisy R [name], female    DOB: 1940-02-03, 73 y.o.   MRN: [id number]  HPI 73 year old female, former smoker 10 pk-yrs, seen for initial pulmonary consult for bilateral pulmonary embolism. 07/13/2012  post hospital followup 07/31/12 --  Patient returns for a post hospital followup. Patient was admitted July 11 through July 24 for an extensive bilateral pulmonary embolism. Patient developed acute shortness of breath. Prior to admission. Patient presented to the emergency room and underwent a CT scan that showed a central and obstructive bilateral pulmonary was a with right heart strain. Patient did have personally have a PEA arrest in the emergency room requiring 3, rounds of CPR. Patient was given emergent TNK . Patient did experience cardiac and respiratory arrest. She required mechanical ventilation in the ICU.unfortunately patient experienced a second drop in her hemoglobin down to 4. Her heparin was stopped and protamine was administered. Patient did require a packed red blood cell transfusion. CT abdomen and pelvis was negative, except for an intramuscular hematoma on the right. Patient will underwent an IVC filter placement. Venous Doppler was positive for a right leg DVT Patient's hemoglobin improved, and patient was transitioned back over to anticoagulation. On July 17. With a bridge of Lovenox, and warfarin. Patient was discharged with Coumadin. Clinic, following her. Patient's last INR yesterday was 4.0. Her Coumadin was adjusted. Patient denies any known bleeding. She denies any hemoptysis, orthopnea, PND, or leg swelling. Has noticed over the last few days, and her cough has worsened and has been getting some brown, yellow and clear mucus up at times. She denies any fever.  ROV 08/29/12 -- f/u visit for PE's, associated R heart strain. Associated w RLL atx / infiltrate. Repeat CXR today shows persistent mild atx. She is on coumadin. She was hospitalized again for what  sounds like UA irritation. [facility] Repeat TTE 8/4 shows improvement in PASP compared with 7/11. CT scan 08/05/12 with subcutaneous chest wall nodules, no bx done to date, deferred.   ROV 03/22/13 -- f/u visit for PE's, associated R heart strain. She remains on coumadin and we are planning for life long. She has been having some R arm tingling - not all the time. ? A cervical radiculopathy. No real breathing difficulty - has used SABA rarely, not every day.    Review of Systems     Objective:   Physical Exam Filed Vitals:   03/12/13 1550  BP: 172/92  Pulse: 78  Height: 5\' 6"  (1.676 m)  Weight: 213 lb 3.2 oz (96.707 kg)  SpO2: 97%   GEN: A/Ox3; pleasant , NAD  HEENT:  [place]/AT,  EACs-clear, TMs-wnl, NOSE-clear, THROAT-clear, no lesions, no postnasal drip or exudate noted.   NECK:  Supple w/ fair ROM; no JVD; normal carotid impulses w/o bruits; no thyromegaly or nodules palpated; no lymphadenopathy.  RESP  Clear  P & A; w/o, wheezes/ rales/ or rhonchi.no accessory muscle use, no dullness to percussion  CARD:  RRR, no m/r/g  , tr peripheral edema, pulses intact, no cyanosis or clubbing.  GI:   Soft & nt; nml bowel sounds; no organomegaly or masses detected.  Musco: Warm bil, no deformities or joint swelling noted.   Neuro: alert, no focal deficits noted.    Skin: Warm, no lesions or rashes     Assessment & Plan:  Acute massive pulmonary embolism She has been doing well. Tolerates warfarin and is therapeutic. She will need anticoag for  life. Discussed with her today alternative anticoag. She will think about this. I offered to change her if she wants.

## 2013-03-12 NOTE — Addendum Note (Signed)
Addended by: Gwynneth Aliment A on: 03/12/2013 04:52 PM   Modules accepted: Orders

## 2013-03-12 NOTE — Patient Instructions (Signed)
Please continue your coumadin  Use Ventolin 2 puffs as needed for shortness of breath Follow with Dr Delton Coombes in 6 months or sooner if you have any problems

## 2013-03-15 ENCOUNTER — Telehealth: Payer: Self-pay | Admitting: Emergency Medicine

## 2013-03-15 MED ORDER — ALBUTEROL SULFATE HFA 108 (90 BASE) MCG/ACT IN AERS: 2.0000 | INHALATION_SPRAY | Freq: Four times a day (QID) | RESPIRATORY_TRACT | Status: DC | PRN

## 2013-03-15 NOTE — Telephone Encounter (Signed)
Spoke with pt daughter. Aware rx has been sent in

## 2013-03-19 ENCOUNTER — Telehealth: Payer: Self-pay | Admitting: Family Medicine

## 2013-03-19 NOTE — Telephone Encounter (Signed)
[facility] - [place], [place] - [address] requesting re-fill on: metFORMIN (GLUCOPHAGE) 500 MG tablet glipiZIDE (GLUCOTROL) 5 MG tablet

## 2013-03-20 MED ORDER — LEVOTHYROXINE SODIUM 50 MCG PO TABS: 50.0000 ug | ORAL_TABLET | Freq: Every day | ORAL | Status: DC

## 2013-03-20 MED ORDER — METOPROLOL TARTRATE 25 MG PO TABS: 25.0000 mg | ORAL_TABLET | Freq: Two times a day (BID) | ORAL | Status: DC

## 2013-03-20 MED ORDER — SIMVASTATIN 20 MG PO TABS: 20.0000 mg | ORAL_TABLET | Freq: Every day | ORAL | Status: DC

## 2013-03-20 NOTE — Telephone Encounter (Signed)
Rx sent to pharmacy   

## 2013-03-20 NOTE — Telephone Encounter (Signed)
[facility] is requesting re-fills on the following: levothyroxine (SYNTHROID, LEVOTHROID) 50 MCG tablet metoprolol tartrate (LOPRESSOR) 25 MG tablet simvastatin (ZOCOR) 20 MG tablet

## 2013-03-22 ENCOUNTER — Telehealth: Payer: Self-pay | Admitting: Family Medicine

## 2013-03-22 MED ORDER — GLIPIZIDE 5 MG PO TABS: 5.0000 mg | ORAL_TABLET | Freq: Every day | ORAL | Status: DC

## 2013-03-22 MED ORDER — METFORMIN HCL 500 MG PO TABS: 500.0000 mg | ORAL_TABLET | Freq: Two times a day (BID) | ORAL | Status: DC

## 2013-03-22 NOTE — Telephone Encounter (Signed)
Rx sent to pharmacy   

## 2013-03-22 NOTE — Telephone Encounter (Signed)
[facility] - [place], [place] - [address] is requesting re-fill on the following: metFORMIN (GLUCOPHAGE) 500 MG tablet glipiZIDE (GLUCOTROL) 5 MG tablet

## 2013-04-09 ENCOUNTER — Telehealth: Payer: Self-pay | Admitting: Internal Medicine

## 2013-04-09 NOTE — Telephone Encounter (Signed)
Records rec Via Fax from Dr.Kadakia will Hold For Debra M

## 2013-04-10 ENCOUNTER — Emergency Department (INDEPENDENT_AMBULATORY_CARE_PROVIDER_SITE_OTHER): Payer: Medicare Other

## 2013-04-10 ENCOUNTER — Encounter (HOSPITAL_COMMUNITY): Payer: Self-pay | Admitting: Emergency Medicine

## 2013-04-10 ENCOUNTER — Emergency Department (HOSPITAL_COMMUNITY)
Admission: EM | Admit: 2013-04-10 | Discharge: 2013-04-10 | Disposition: A | Discharge status: Home / Self Care | Payer: Medicare Other | Source: Home / Self Care | Attending: Emergency Medicine | Admitting: Emergency Medicine

## 2013-04-10 DIAGNOSIS — M5430 Sciatica, unspecified side: Secondary | ICD-10-CM

## 2013-04-10 DIAGNOSIS — M5431 Sciatica, right side: Secondary | ICD-10-CM

## 2013-04-10 DIAGNOSIS — M25559 Pain in unspecified hip: Secondary | ICD-10-CM

## 2013-04-10 DIAGNOSIS — M25551 Pain in right hip: Secondary | ICD-10-CM

## 2013-04-10 MED ORDER — TRAMADOL HCL 50 MG PO TABS: 50.0000 mg | ORAL_TABLET | Freq: Two times a day (BID) | ORAL | Status: DC | PRN

## 2013-04-10 NOTE — ED Notes (Signed)
Pt  Reports  r  Hip  Pain  That  Started  Yesterday   She  denys  Any  Recent  specefic  Injury        She  denys  Any hip  Pain of this nature  In the  Past      She  Is  Sitting  uprighton the  Exam table  Speaking in  Complete  sentances       Family  Member  At  Bedside

## 2013-04-10 NOTE — Discharge Instructions (Signed)
Your xrays were normal. Please use medication as prescribed for pain. If symptoms do not begin to improve over the next few days, please either follow up with your primary care doctor (Dr. [name]) or with your orthopedist (Dr. Luiz Blare) for further evaluation.   Arthralgia Your caregiver has diagnosed you as suffering from an arthralgia. Arthralgia means there is pain in a joint. This can come from many reasons including:  Bruising the joint which causes soreness (inflammation) in the joint.  Wear and tear on the joints which occur as we grow older (osteoarthritis).  Overusing the joint.  Various forms of arthritis.  Infections of the joint. Regardless of the cause of pain in your joint, most of these different pains respond to anti-inflammatory drugs and rest. The exception to this is when a joint is infected, and these cases are treated with antibiotics, if it is a bacterial infection. HOME CARE INSTRUCTIONS   Rest the injured area for as long as directed by your caregiver. Then slowly start using the joint as directed by your caregiver and as the pain allows. Crutches as directed may be useful if the ankles, knees or hips are involved. If the knee was splinted or casted, continue use and care as directed. If an stretchy or elastic wrapping bandage has been applied today, it should be removed and re-applied every 3 to 4 hours. It should not be applied tightly, but firmly enough to keep swelling down. Watch toes and feet for swelling, bluish discoloration, coldness, numbness or excessive pain. If any of these problems (symptoms) occur, remove the ace bandage and re-apply more loosely. If these symptoms persist, contact your caregiver or return to this location.  For the first 24 hours, keep the injured extremity elevated on pillows while lying down.  Apply ice for 15-20 minutes to the sore joint every couple hours while awake for the first half day. Then 03-04 times per day for the first 48 hours.  Put the ice in a plastic bag and place a towel between the bag of ice and your skin.  Wear any splinting, casting, elastic bandage applications, or slings as instructed.  Only take over-the-counter or prescription medicines for pain, discomfort, or fever as directed by your caregiver. Do not use aspirin immediately after the injury unless instructed by your physician. Aspirin can cause increased bleeding and bruising of the tissues.  If you were given crutches, continue to use them as instructed and do not resume weight bearing on the sore joint until instructed. Persistent pain and inability to use the sore joint as directed for more than 2 to 3 days are warning signs indicating that you should see a caregiver for a follow-up visit as soon as possible. Initially, a hairline fracture (break in bone) may not be evident on X-rays. Persistent pain and swelling indicate that further evaluation, non-weight bearing or use of the joint (use of crutches or slings as instructed), or further X-rays are indicated. X-rays may sometimes not show a small fracture until a week or 10 days later. Make a follow-up appointment with your own caregiver or one to whom we have referred you. A radiologist (specialist in reading X-rays) may read your X-rays. Make sure you know how you are to obtain your X-ray results. Do not assume everything is normal if you do not hear from [place]. SEEK MEDICAL CARE IF: Bruising, swelling, or pain increases. SEEK IMMEDIATE MEDICAL CARE IF:   Your fingers or toes are numb or blue.  The pain is  not responding to medications and continues to stay the same or get worse.  The pain in your joint becomes severe.  You develop a fever over 102 F (38.9 C).  It becomes impossible to move or use the joint. MAKE SURE YOU:   Understand these instructions.  Will watch your condition.  Will get help right away if you are not doing well or get worse. Document Released: 12/20/2004 Document  Revised: 03/14/2011 Document Reviewed: 08/08/2007 [facility] Patient Information 2014 [employer] [place].

## 2013-04-10 NOTE — ED Provider Notes (Signed)
CSN: [id number]     Arrival date & time 04/10/13  0850 History   First MD Initiated Contact with Patient 04/10/13 [phone]     Chief Complaint  Patient presents with  . Hip Pain   (Consider location/radiation/quality/duration/timing/severity/associated sxs/prior Treatment) HPI Comments: Long standing hx of DJD/OA. Is S/P right TKR, left TKR, and left THR (Ortho: Dr. Luiz Blare).  No report of injury or fall.  No fever or bowel/bladder issues.  No rash in area of discomfort.    Patient is a 73 y.o. female presenting with hip pain. The history is provided by the patient.  Hip Pain This is a new problem. The current episode started yesterday. The problem occurs constantly. The problem has not changed since onset.The symptoms are aggravated by walking and standing (changes in position while lying in bed over night). Nothing relieves the symptoms. She has tried acetaminophen for the symptoms. The treatment provided no relief.    Past Medical History  Diagnosis Date  . Thyroid disease   . Diabetes mellitus without complication   . DVT (deep venous thrombosis)   . Acute massive pulmonary embolism 07/13/2012    Massive PE w/ PEA arrest 07/13/12 >TNK >IVC filter >discharged on comadin    . Cardiogenic shock 07/13/2012  . Hemorrhagic shock 07/15/2012  . Acute renal failure 07/14/2012  . Acute respiratory failure with hypoxia 07/14/2012   History reviewed. No pertinent past surgical history. History reviewed. No pertinent family history. History  Substance Use Topics  . Smoking status: Former Smoker -- 1.00 packs/day for 10 years    Types: Cigarettes    Quit date: 01/04/1968  . Smokeless tobacco: Never Used  . Alcohol Use: No   OB History   Grav Para Term Preterm Abortions TAB SAB Ect Mult Living                 Review of Systems  All other systems reviewed and are negative.   Allergies  Augmentin  Home Medications   Current Outpatient Rx  Name  Route  Sig  Dispense  Refill  .  albuterol (PROVENTIL HFA;VENTOLIN HFA) 108 (90 BASE) MCG/ACT inhaler   Inhalation   Inhale 2 puffs into the lungs every 6 (six) hours as needed for wheezing or shortness of breath.   1 Inhaler   6   . albuterol (PROVENTIL) (5 MG/ML) 0.5% nebulizer solution   Nebulization   Take 0.5 mLs (2.5 mg total) by nebulization every 3 (three) hours as needed for wheezing or shortness of breath.   20 mL   12   . budesonide (PULMICORT) 0.25 MG/2ML nebulizer solution   Nebulization   Take 2 mLs (0.25 mg total) by nebulization 2 (two) times daily.   60 mL   12   . cholecalciferol (VITAMIN D) 1000 UNITS tablet   Oral   Take 1,000 Units by mouth daily.          Marland Kitchen glipiZIDE (GLUCOTROL) 5 MG tablet   Oral   Take 1 tablet (5 mg total) by mouth daily before breakfast.   90 tablet   1   . glucose blood (ONETOUCH VERIO) test strip      Test daily.   100 each   12   . ipratropium (ATROVENT) 0.02 % nebulizer solution   Nebulization   Take 0.5 mg by nebulization every 6 (six) hours as needed (for shortness of breath).         Marland Kitchen levothyroxine (SYNTHROID, LEVOTHROID) 50 MCG tablet   Oral  Take 1 tablet (50 mcg total) by mouth daily before breakfast.   90 tablet   3   . metFORMIN (GLUCOPHAGE) 500 MG tablet   Oral   Take 1 tablet (500 mg total) by mouth 2 (two) times daily with a meal.   180 tablet   1   . metoprolol tartrate (LOPRESSOR) 25 MG tablet   Oral   Take 1 tablet (25 mg total) by mouth 2 (two) times daily.   180 tablet   0   . simvastatin (ZOCOR) 20 MG tablet   Oral   Take 1 tablet (20 mg total) by mouth at bedtime.   90 tablet   3   . traMADol (ULTRAM) 50 MG tablet   Oral   Take 1 tablet (50 mg total) by mouth every 12 (twelve) hours as needed for moderate pain or severe pain.   15 tablet   0   . vitamin C (ASCORBIC ACID) 500 MG tablet   Oral   Take 500 mg by mouth daily.          Marland Kitchen. warfarin (COUMADIN) 5 MG tablet   Oral   Take 2.5-5 mg by mouth daily.  5mg  daily except 2.5mg  on saturday (per Coumadin Clinic notes)          BP 147/58  Pulse 57  Temp(Src) 98.5 F (36.9 C) (Oral)  Resp 16  SpO2 100% Physical Exam  Nursing note and vitals reviewed. Constitutional: She is oriented to person, place, and time. She appears well-developed and well-nourished. No distress.  +obese  HENT:  Head: Normocephalic and atraumatic.  Eyes: Conjunctivae are normal.  Cardiovascular: Normal rate, regular rhythm and normal heart sounds.   Pulmonary/Chest: Effort normal and breath sounds normal.  Musculoskeletal:       Right hip: She exhibits tenderness. She exhibits normal range of motion, normal strength, no bony tenderness, no swelling, no crepitus, no deformity and no laceration.       Legs: CSM exam of RLE intact. Able to stand and ambulate in exam room. +point tenderness at right SI joint. No midline LS spine tenderness. No abdominal pain.  Neurological: She is alert and oriented to person, place, and time.  Skin: Skin is warm and dry. No rash noted.  Psychiatric: She has a normal mood and affect. Her behavior is normal.    ED Course  Procedures (including critical care time) Labs Review Labs Reviewed - No data to display Imaging Review Dg Hip Complete Right  04/10/2013   CLINICAL DATA:  Worsening right hip pain.  EXAM: RIGHT HIP - COMPLETE 2+ VIEW  COMPARISON:  None.  FINDINGS: No acute osseous or joint abnormality. Right hip joint space is maintained. No subchondral sclerosis or osteophytosis. Left hip arthroplasty. Obturator rings are intact. Degenerative changes are seen in the lumbar spine.  IMPRESSION: No acute or degenerative findings to account for the patient's right hand pain.   Electronically Signed   By: Leanna BattlesMelinda  Blietz M.D.   On: 04/10/2013 10:01     MDM   1. Arthralgia of hip, right   2. Sciatica of right side   Radiographs unremarkable. Exam without evidence of acute abdominal issue or sciatica/arthralgia resulting in lower  extremity neurological deficit or cauda equina syndrome. Will provide patient with Rx for Ultram and advise close follow up with either PCP or Orthopedist.     Ardis RowanJennifer Lee Ann-Marie Kluge, PA 04/10/13 1013

## 2013-04-11 NOTE — ED Provider Notes (Signed)
Medical screening examination/treatment/procedure(s) were performed by non-physician practitioner and as supervising physician I was immediately available for consultation/collaboration.  Leslee Home, M.D.  Reuben Likes, MD 04/11/13 [phone]

## 2013-04-15 ENCOUNTER — Other Ambulatory Visit: Payer: Self-pay | Admitting: Family Medicine

## 2013-04-15 ENCOUNTER — Ambulatory Visit (INDEPENDENT_AMBULATORY_CARE_PROVIDER_SITE_OTHER): Payer: Medicare Other

## 2013-04-15 DIAGNOSIS — N63 Unspecified lump in unspecified breast: Secondary | ICD-10-CM

## 2013-04-15 DIAGNOSIS — I2699 Other pulmonary embolism without acute cor pulmonale: Secondary | ICD-10-CM

## 2013-04-15 DIAGNOSIS — Z7901 Long term (current) use of anticoagulants: Secondary | ICD-10-CM

## 2013-04-15 LAB — POCT INR: INR: 3.2

## 2013-05-13 ENCOUNTER — Ambulatory Visit
Admission: RE | Admit: 2013-05-13 | Discharge: 2013-05-13 | Disposition: A | Discharge status: Home / Self Care | Payer: Medicare Other | Source: Ambulatory Visit | Attending: Family Medicine | Admitting: Family Medicine

## 2013-05-13 DIAGNOSIS — N63 Unspecified lump in unspecified breast: Secondary | ICD-10-CM

## 2013-05-28 ENCOUNTER — Ambulatory Visit (INDEPENDENT_AMBULATORY_CARE_PROVIDER_SITE_OTHER): Payer: Medicare Other | Admitting: Pharmacist

## 2013-05-28 DIAGNOSIS — I2699 Other pulmonary embolism without acute cor pulmonale: Secondary | ICD-10-CM

## 2013-05-28 DIAGNOSIS — Z7901 Long term (current) use of anticoagulants: Secondary | ICD-10-CM

## 2013-05-28 LAB — POCT INR: INR: 4.1

## 2013-05-30 ENCOUNTER — Ambulatory Visit: Payer: Medicare Other | Admitting: Family Medicine

## 2013-06-06 ENCOUNTER — Encounter: Payer: Self-pay | Admitting: *Deleted

## 2013-06-06 ENCOUNTER — Ambulatory Visit (INDEPENDENT_AMBULATORY_CARE_PROVIDER_SITE_OTHER): Payer: Medicare Other | Admitting: Family Medicine

## 2013-06-06 ENCOUNTER — Encounter: Payer: Self-pay | Admitting: Family Medicine

## 2013-06-06 VITALS — BP 132/88 | HR 53 | Temp 98.2°F | Ht 66.0 in | Wt 212.5 lb

## 2013-06-06 DIAGNOSIS — E039 Hypothyroidism, unspecified: Secondary | ICD-10-CM

## 2013-06-06 DIAGNOSIS — I160 Hypertensive urgency: Secondary | ICD-10-CM | POA: Insufficient documentation

## 2013-06-06 DIAGNOSIS — E785 Hyperlipidemia, unspecified: Secondary | ICD-10-CM

## 2013-06-06 DIAGNOSIS — Z7901 Long term (current) use of anticoagulants: Secondary | ICD-10-CM

## 2013-06-06 DIAGNOSIS — L608 Other nail disorders: Secondary | ICD-10-CM

## 2013-06-06 DIAGNOSIS — L609 Nail disorder, unspecified: Secondary | ICD-10-CM

## 2013-06-06 DIAGNOSIS — E1149 Type 2 diabetes mellitus with other diabetic neurological complication: Secondary | ICD-10-CM | POA: Insufficient documentation

## 2013-06-06 DIAGNOSIS — I10 Essential (primary) hypertension: Secondary | ICD-10-CM

## 2013-06-06 HISTORY — DX: Hypothyroidism, unspecified: E03.9

## 2013-06-06 MED ORDER — METOPROLOL TARTRATE 25 MG PO TABS: 25.0000 mg | ORAL_TABLET | Freq: Two times a day (BID) | ORAL | Status: DC

## 2013-06-06 MED ORDER — LEVOTHYROXINE SODIUM 50 MCG PO TABS: 50.0000 ug | ORAL_TABLET | Freq: Every day | ORAL | Status: DC

## 2013-06-06 MED ORDER — SIMVASTATIN 20 MG PO TABS: 20.0000 mg | ORAL_TABLET | Freq: Every day | ORAL | Status: DC

## 2013-06-06 MED ORDER — METFORMIN HCL 500 MG PO TABS: 500.0000 mg | ORAL_TABLET | Freq: Two times a day (BID) | ORAL | Status: DC

## 2013-06-06 MED ORDER — GLIPIZIDE 5 MG PO TABS: 5.0000 mg | ORAL_TABLET | Freq: Every day | ORAL | Status: DC

## 2013-06-06 NOTE — Progress Notes (Addendum)
No chief complaint on file.   HPI:  Follow up:  1)Hypothyroid: -on synthroid chronically -stable  2)DM: -stable Lab Results  Component Value Date   HGBA1C 6.9* 02/25/2013  -denies: polyuria, polydipsia, hypoglycemia, foot ulcers, changes in vision -last eye exam -home BS: 1 teens - 130s fasting -current meds:metformin 500mg  bid, glipizide 5mg  daily -she wants to see a podiatrist to have her toenails clipped  3)HX Pulm Embolism with cardiogenic shock and resp failure in 2014 -followed by pulmonologist (Dr. Delton Coombes) and vascular doctor (Dr. Darrick Penna), recent notes reviewed -denies: SOB, CP, leg swelling -on chronic coumadin, uses albuterol prn rarely, pulmicort and atrovent on med list  4)Hx cardiogenic shock, HTN, HLD: -followed by Dr. 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  Past Medical History  Diagnosis Date  . Thyroid disease   . DVT (deep venous thrombosis)   . Acute massive pulmonary embolism 07/13/2012    Massive PE w/ PEA arrest 07/13/12 >TNK >IVC filter >discharged on comadin    . Cardiogenic shock 07/13/2012  . Hemorrhagic shock 07/15/2012  . Acute renal failure 07/14/2012  . Acute respiratory failure with hypoxia 07/14/2012  . Hyperlipemia 02/25/2013  . Hypertension 06/06/2013  . Diabetes mellitus with neurological manifestations, controlled 06/06/2013    Past Surgical History  Procedure Laterality Date  . Sp arthro hip*l*    . Knee arthroscopy      No family history on file.  History   Social History  . Marital Status: Widowed    Spouse Name: N/A    Number of Children: N/A  . Years of  Education: N/A   Social History Main Topics  . Smoking status: Former Smoker -- 1.00 packs/day for 10 years    Types: Cigarettes    Quit date: 01/04/1968  . Smokeless tobacco: Never Used  . Alcohol Use: No  . Drug Use: No  . Sexual Activity: None   Other Topics Concern  . None   Social History Narrative   Lives alone.           Current outpatient prescriptions:cholecalciferol (VITAMIN D) 1000 UNITS tablet, Take 1,000 Units by mouth daily. , Disp: , Rfl: ;  glipiZIDE (GLUCOTROL) 5 MG tablet, Take 1 tablet (5 mg total) by mouth daily before breakfast., Disp: 90 tablet, Rfl: 3;  glucose blood (ONETOUCH VERIO) test strip, Test daily., Disp: 100 each, Rfl: 12 levothyroxine (SYNTHROID, LEVOTHROID) 50 MCG tablet, Take 1 tablet (50 mcg total) by mouth daily before breakfast., Disp: 90 tablet, Rfl: 3;  metFORMIN (GLUCOPHAGE) 500 MG tablet, Take 1 tablet (500 mg total) by mouth 2 (two) times daily with a meal., Disp: 180 tablet, Rfl: 3;  metoprolol tartrate (LOPRESSOR) 25 MG tablet, Take 1 tablet (25 mg total) by mouth 2 (two) times daily., Disp: 180 tablet, Rfl: 3 simvastatin (ZOCOR) 20 MG tablet, Take 1 tablet (20 mg total) by mouth at bedtime., Disp: 90 tablet, Rfl: 3;  vitamin C (ASCORBIC ACID) 500 MG tablet, Take 500 mg by mouth daily. , Disp: , Rfl: ;  VITAMIN E PO, Take by mouth daily., Disp: , Rfl: ;  warfarin (COUMADIN) 5  MG tablet, Take 2.5-5 mg by mouth daily. 5mg  daily except 2.5mg  on saturday (per Coumadin Clinic notes), Disp: , Rfl:   EXAM:  Filed Vitals:   06/06/13 1252  BP: 132/88  Pulse: 53  Temp: 98.2 F (36.8 C)    Body mass index is 34.31 kg/(m^2).  GENERAL: vitals reviewed and listed above, alert, oriented, appears well hydrated and in no acute distress  HEENT: atraumatic, conjunttiva clear, no obvious abnormalities on inspection of external nose and ears  NECK: no obvious masses on inspection  LUNGS: clear to auscultation bilaterally, no wheezes, rales or  rhonchi, good air movement  CV: HRRR, no peripheral edema  MS: moves all extremities without noticeable abnormality  PSYCH: pleasant and cooperative, no obvious depression or anxiety  Foot exam: see chart  ASSESSMENT AND PLAN:  Discussed the following assessment and plan:  Hypothyroid - Plan: levothyroxine (SYNTHROID, LEVOTHROID) 50 MCG tablet  Hypertension - Plan: metoprolol tartrate (LOPRESSOR) 25 MG tablet  Toenail deformity - Plan: Ambulatory referral to Podiatry  Hyperlipemia - Plan: simvastatin (ZOCOR) 20 MG tablet  Long term (current) use of anticoagulants for hx of massive pulmonary embolism, DVT, cardiogenic shock, respiratory failure 2014 - followed by pulmonologist and cardiologist  Diabetes mellitus with neurological manifestations, controlled - Plan: metFORMIN (GLUCOPHAGE) 500 MG tablet, glipiZIDE (GLUCOTROL) 5 MG tablet  -wrote letter for hip arthroplasy and attached xray report -refilled medications -reviewed prior labs and opted for repeat labs next visit -advised optho visit and number provided to call -referral to podiatry -Patient advised to return or notify a doctor immediately if symptoms worsen or persist or new concerns arise.  Patient Instructions  -We placed a referral for you as discussed to the foot doctor. It usually takes about 1-2 weeks to process and schedule this referral. If you have not heard from us regarding this appointment in 2 weeks please contact our office.  FOR YOUR DIABETES:  []  Eat a healthy low carb diet (avoid sweets, sweet drinks, breads, potatoes, rice, etc.) and ensure 3 small meals daily.  []  Get AT LEAST 150 minutes of cardiovascular exercise per week - 30 minutes per day is best of sustained sweaty exercise.  []  Take all of your medications every day as directed by your doctor. Call your doctor immediately if you have any questions about your medications or are running low.  []  Check your blood sugar often and when you  feel unwell and keep a log to bring to all health appointments. FASTING: before you eat anything in the morning POSTPRANDIAL: 1-2 hours after a meal  []  If any low blood sugars < 70, eat a snack and call your doctor immediately.  []  See an eye doctor every year and fax your diabetic eye exam to our office.  Fax: [phone]  []  Take good care of your feet and keep them soft and callus free. Check your feet daily and wear comfortable shoes. See your doctor immediately if you have any cuts, calluses or wounds on your feet.  TODAY AS YOU LEAVE, PLEASE SCHEDULE FOLLOW UP IN THREE MONTHS IN THE MORNING. DRINK PLENTY OF WATER THAT MORNING BUT COME FASTING SO WE CAN CHECK YOUR LABS.       Hannah KoyanagiHannah R. Ivelisse Culverhouse

## 2013-06-06 NOTE — Patient Instructions (Addendum)
-  We placed a referral for you as discussed to the foot doctor. It usually takes about 1-2 weeks to process and schedule this referral. If you have not heard from [place] regarding this appointment in 2 weeks please contact our office.  FOR YOUR DIABETES:  []  Eat a healthy low carb diet (avoid sweets, sweet drinks, breads, potatoes, rice, etc.) and ensure 3 small meals daily.  []  Get AT LEAST 150 minutes of cardiovascular exercise per week - 30 minutes per day is best of sustained sweaty exercise.  []  Take all of your medications every day as directed by your doctor. Call your doctor immediately if you have any questions about your medications or are running low.  []  Check your blood sugar often and when you feel unwell and keep a log to bring to all health appointments. FASTING: before you eat anything in the morning POSTPRANDIAL: 1-2 hours after a meal  []  If any low blood sugars < 70, eat a snack and call your doctor immediately.  []  See an eye doctor every year and fax your diabetic eye exam to our office.  Fax: [phone]  []  Take good care of your feet and keep them soft and callus free. Check your feet daily and wear comfortable shoes. See your doctor immediately if you have any cuts, calluses or wounds on your feet.  TODAY AS YOU LEAVE, PLEASE SCHEDULE FOLLOW UP IN THREE MONTHS IN THE MORNING. DRINK PLENTY OF WATER THAT MORNING BUT COME FASTING SO WE CAN CHECK YOUR LABS.

## 2013-06-06 NOTE — Progress Notes (Signed)
Pre visit review using our clinic review tool, if applicable. No additional management support is needed unless otherwise documented below in the visit note. 

## 2013-06-10 ENCOUNTER — Ambulatory Visit (INDEPENDENT_AMBULATORY_CARE_PROVIDER_SITE_OTHER): Payer: Medicare Other

## 2013-06-10 DIAGNOSIS — Z7901 Long term (current) use of anticoagulants: Secondary | ICD-10-CM

## 2013-06-10 DIAGNOSIS — I2699 Other pulmonary embolism without acute cor pulmonale: Secondary | ICD-10-CM

## 2013-06-10 DIAGNOSIS — Z86711 Personal history of pulmonary embolism: Secondary | ICD-10-CM | POA: Insufficient documentation

## 2013-06-10 LAB — POCT INR: INR: 1.4

## 2013-06-13 ENCOUNTER — Telehealth: Payer: Self-pay | Admitting: Family Medicine

## 2013-06-13 DIAGNOSIS — E1149 Type 2 diabetes mellitus with other diabetic neurological complication: Secondary | ICD-10-CM

## 2013-06-13 NOTE — Telephone Encounter (Signed)
[facility] - [place], [place] - [address] is requesting re-fill on glipiZIDE (GLUCOTROL) 5 MG tablet

## 2013-06-14 MED ORDER — GLIPIZIDE 5 MG PO TABS: 5.0000 mg | ORAL_TABLET | Freq: Every day | ORAL | Status: DC

## 2013-06-17 ENCOUNTER — Ambulatory Visit (INDEPENDENT_AMBULATORY_CARE_PROVIDER_SITE_OTHER): Payer: Medicare Other | Admitting: *Deleted

## 2013-06-17 DIAGNOSIS — I2699 Other pulmonary embolism without acute cor pulmonale: Secondary | ICD-10-CM

## 2013-06-17 DIAGNOSIS — Z7901 Long term (current) use of anticoagulants: Secondary | ICD-10-CM

## 2013-06-17 LAB — POCT INR: INR: 2.8

## 2013-06-18 ENCOUNTER — Telehealth: Payer: Self-pay | Admitting: Family Medicine

## 2013-06-18 ENCOUNTER — Telehealth: Payer: Self-pay | Admitting: *Deleted

## 2013-06-18 DIAGNOSIS — E1149 Type 2 diabetes mellitus with other diabetic neurological complication: Secondary | ICD-10-CM

## 2013-06-18 MED ORDER — GLIPIZIDE 5 MG PO TABS: 5.0000 mg | ORAL_TABLET | Freq: Every day | ORAL | Status: DC

## 2013-06-18 NOTE — Telephone Encounter (Signed)
I called [facility] and the order for a refill on Glipizide 5mg  was given to [facility] (pharmacist).

## 2013-06-18 NOTE — Telephone Encounter (Signed)
[facility] - [place], [place] - [address] is requesting re-fill on glipiZIDE (GLUCOTROL) 5 MG tablet. It was not received on 06/14/13

## 2013-06-26 ENCOUNTER — Ambulatory Visit: Payer: Self-pay | Admitting: Podiatry

## 2013-07-08 ENCOUNTER — Ambulatory Visit (INDEPENDENT_AMBULATORY_CARE_PROVIDER_SITE_OTHER): Payer: Medicare Other | Admitting: *Deleted

## 2013-07-08 ENCOUNTER — Encounter: Payer: Self-pay | Admitting: Podiatry

## 2013-07-08 ENCOUNTER — Ambulatory Visit (INDEPENDENT_AMBULATORY_CARE_PROVIDER_SITE_OTHER): Payer: Medicare Other | Admitting: Podiatry

## 2013-07-08 VITALS — BP 134/82 | HR 59 | Resp 18

## 2013-07-08 DIAGNOSIS — B351 Tinea unguium: Secondary | ICD-10-CM

## 2013-07-08 DIAGNOSIS — I2699 Other pulmonary embolism without acute cor pulmonale: Secondary | ICD-10-CM

## 2013-07-08 DIAGNOSIS — R0989 Other specified symptoms and signs involving the circulatory and respiratory systems: Secondary | ICD-10-CM

## 2013-07-08 DIAGNOSIS — Z7901 Long term (current) use of anticoagulants: Secondary | ICD-10-CM

## 2013-07-08 DIAGNOSIS — E1149 Type 2 diabetes mellitus with other diabetic neurological complication: Secondary | ICD-10-CM

## 2013-07-08 LAB — POCT INR: INR: 5.2

## 2013-07-08 NOTE — Progress Notes (Signed)
   Subjective:    Patient ID: Hannah [name], female    DOB: 03/21/1940, 73 y.o.   MRN: [id number]  HPI DR KIM REFERRED ME OVER TO SEE THE DOCTOR AND I HAVE BEEN A DIABETIC SINCE 1990'S AND MAY HAVE SOME NEUROPATHY AND MY NAILS ARE THICK AND DISCOLORED     Review of Systems  All other systems reviewed and are negative.      Objective:   Physical Exam Orientated x3 black female presents with her daughter  Vascular: DP pulses 2/4 bilaterally PT pulses 1/4 bilaterally  Neurological: Sensation to 10 g monofilament wire intact 5/5 right and 4/5 left Ankle reflexes equal and reactive bilaterally Vibratory sensation nonreactive bilaterally  Dermatological: Well-healed surgical scars dorsal first and second toes noted bilaterally Elongated, incurvated, discolored toenails x10  Musculoskeletal: Hammertoe deformities 3, 4, 5 bilaterally       Assessment & Plan:   Assessment: Decreased pedal pulses bilaterally Protective sensation intact Onychomycoses 6-10  Plan: Refer patient for lower extremity arterial Doppler for the indication of diabetes was decreased pedal pulses Notify patient of results of arterial Doppler Debrided toenails 6 through 10 without a bleeding  Reappoint at three-month intervals for nail debridement

## 2013-07-08 NOTE — Patient Instructions (Signed)
Refer patient to vascular lab for lower extremity arterial Doppler bilaterally Notify patient of results  Diabetes and Foot Care Diabetes may cause you to have problems because of poor blood supply (circulation) to your feet and legs. This may cause the skin on your feet to become thinner, break easier, and heal more slowly. Your skin may become dry, and the skin may peel and crack. You may also have nerve damage in your legs and feet causing decreased feeling in them. You may not notice minor injuries to your feet that could lead to infections or more serious problems. Taking care of your feet is one of the most important things you can do for yourself.  HOME CARE INSTRUCTIONS  Wear shoes at all times, even in the house. Do not go barefoot. Bare feet are easily injured.  Check your feet daily for blisters, cuts, and redness. If you cannot see the bottom of your feet, use a mirror or ask someone for help.  Wash your feet with warm water (do not use hot water) and mild soap. Then pat your feet and the areas between your toes until they are completely dry. Do not soak your feet as this can dry your skin.  Apply a moisturizing lotion or petroleum jelly (that does not contain alcohol and is unscented) to the skin on your feet and to dry, brittle toenails. Do not apply lotion between your toes.  Trim your toenails straight across. Do not dig under them or around the cuticle. File the edges of your nails with an emery board or nail file.  Do not cut corns or calluses or try to remove them with medicine.  Wear clean socks or stockings every day. Make sure they are not too tight. Do not wear knee-high stockings since they may decrease blood flow to your legs.  Wear shoes that fit properly and have enough cushioning. To break in new shoes, wear them for just a few hours a day. This prevents you from injuring your feet. Always look in your shoes before you put them on to be sure there are no objects  inside.  Do not cross your legs. This may decrease the blood flow to your feet.  If you find a minor scrape, cut, or break in the skin on your feet, keep it and the skin around it clean and dry. These areas may be cleansed with mild soap and water. Do not cleanse the area with peroxide, alcohol, or iodine.  When you remove an adhesive bandage, be sure not to damage the skin around it.  If you have a wound, look at it several times a day to make sure it is healing.  Do not use heating pads or hot water bottles. They may burn your skin. If you have lost feeling in your feet or legs, you may not know it is happening until it is too late.  Make sure your health care provider performs a complete foot exam at least annually or more often if you have foot problems. Report any cuts, sores, or bruises to your health care provider immediately. SEEK MEDICAL CARE IF:   You have an injury that is not healing.  You have cuts or breaks in the skin.  You have an ingrown nail.  You notice redness on your legs or feet.  You feel burning or tingling in your legs or feet.  You have pain or cramps in your legs and feet.  Your legs or feet are numb.  Your feet always feel cold. SEEK IMMEDIATE MEDICAL CARE IF:   There is increasing redness, swelling, or pain in or around a wound.  There is a red line that goes up your leg.  Pus is coming from a wound.  You develop a fever or as directed by your health care provider.  You notice a bad smell coming from an ulcer or wound. Document Released: 12/18/1999 Document Revised: 08/22/2012 Document Reviewed: 05/29/2012 [facility] Patient Information 2015 [place], [place]. This information is not intended to replace advice given to you by your health care provider. Make sure you discuss any questions you have with your health care provider.

## 2013-07-09 ENCOUNTER — Other Ambulatory Visit (HOSPITAL_COMMUNITY): Payer: Self-pay

## 2013-07-09 ENCOUNTER — Encounter: Payer: Self-pay | Admitting: Podiatry

## 2013-07-09 ENCOUNTER — Ambulatory Visit (HOSPITAL_COMMUNITY): Payer: Medicare Other | Attending: Cardiology | Admitting: Cardiology

## 2013-07-09 DIAGNOSIS — R0989 Other specified symptoms and signs involving the circulatory and respiratory systems: Secondary | ICD-10-CM | POA: Diagnosis not present

## 2013-07-09 DIAGNOSIS — E1059 Type 1 diabetes mellitus with other circulatory complications: Secondary | ICD-10-CM | POA: Diagnosis not present

## 2013-07-09 DIAGNOSIS — E1159 Type 2 diabetes mellitus with other circulatory complications: Secondary | ICD-10-CM

## 2013-07-09 NOTE — Progress Notes (Signed)
Lower arterial doppler performed. 

## 2013-07-18 ENCOUNTER — Ambulatory Visit (INDEPENDENT_AMBULATORY_CARE_PROVIDER_SITE_OTHER): Payer: Medicare Other | Admitting: *Deleted

## 2013-07-18 DIAGNOSIS — Z7901 Long term (current) use of anticoagulants: Secondary | ICD-10-CM

## 2013-07-18 DIAGNOSIS — I2699 Other pulmonary embolism without acute cor pulmonale: Secondary | ICD-10-CM

## 2013-07-18 LAB — POCT INR: INR: 3.1

## 2013-08-01 ENCOUNTER — Ambulatory Visit (INDEPENDENT_AMBULATORY_CARE_PROVIDER_SITE_OTHER): Payer: Medicare Other | Admitting: Pharmacist

## 2013-08-01 DIAGNOSIS — Z7901 Long term (current) use of anticoagulants: Secondary | ICD-10-CM

## 2013-08-01 DIAGNOSIS — I2699 Other pulmonary embolism without acute cor pulmonale: Secondary | ICD-10-CM

## 2013-08-01 LAB — POCT INR: INR: 4.9

## 2013-08-15 ENCOUNTER — Ambulatory Visit (INDEPENDENT_AMBULATORY_CARE_PROVIDER_SITE_OTHER): Payer: Medicare Other | Admitting: *Deleted

## 2013-08-15 DIAGNOSIS — Z7901 Long term (current) use of anticoagulants: Secondary | ICD-10-CM

## 2013-08-15 DIAGNOSIS — I2699 Other pulmonary embolism without acute cor pulmonale: Secondary | ICD-10-CM

## 2013-08-15 LAB — POCT INR: INR: 3.1

## 2013-08-15 MED ORDER — WARFARIN SODIUM 5 MG PO TABS: ORAL_TABLET | ORAL | Status: DC

## 2013-09-05 ENCOUNTER — Ambulatory Visit (INDEPENDENT_AMBULATORY_CARE_PROVIDER_SITE_OTHER): Payer: Medicare Other | Admitting: *Deleted

## 2013-09-05 DIAGNOSIS — I2699 Other pulmonary embolism without acute cor pulmonale: Secondary | ICD-10-CM

## 2013-09-05 DIAGNOSIS — Z7901 Long term (current) use of anticoagulants: Secondary | ICD-10-CM

## 2013-09-05 LAB — POCT INR: INR: 2

## 2013-09-06 ENCOUNTER — Ambulatory Visit (INDEPENDENT_AMBULATORY_CARE_PROVIDER_SITE_OTHER): Payer: Medicare Other | Admitting: Family Medicine

## 2013-09-06 ENCOUNTER — Encounter: Payer: Self-pay | Admitting: Family Medicine

## 2013-09-06 VITALS — BP 124/84 | HR 60 | Temp 97.6°F | Ht 66.0 in | Wt 208.0 lb

## 2013-09-06 DIAGNOSIS — E785 Hyperlipidemia, unspecified: Secondary | ICD-10-CM

## 2013-09-06 DIAGNOSIS — I10 Essential (primary) hypertension: Secondary | ICD-10-CM

## 2013-09-06 DIAGNOSIS — Z96659 Presence of unspecified artificial knee joint: Secondary | ICD-10-CM

## 2013-09-06 DIAGNOSIS — E038 Other specified hypothyroidism: Secondary | ICD-10-CM

## 2013-09-06 DIAGNOSIS — E1149 Type 2 diabetes mellitus with other diabetic neurological complication: Secondary | ICD-10-CM

## 2013-09-06 DIAGNOSIS — Z96649 Presence of unspecified artificial hip joint: Secondary | ICD-10-CM

## 2013-09-06 DIAGNOSIS — Z7901 Long term (current) use of anticoagulants: Secondary | ICD-10-CM

## 2013-09-06 LAB — MICROALBUMIN / CREATININE URINE RATIO
Creatinine,U: 225 mg/dL
MICROALB UR: 2.9 mg/dL — AB (ref 0.0–1.9)
MICROALB/CREAT RATIO: 1.3 mg/g (ref 0.0–30.0)

## 2013-09-06 LAB — LIPID PANEL
Cholesterol: 139 mg/dL (ref 0–200)
HDL: 52 mg/dL (ref >39.00)
LDL Cholesterol: 71 mg/dL (ref 0–99)
NONHDL: 87
Total CHOL/HDL Ratio: 3
Triglycerides: 82 mg/dL (ref 0.0–149.0)
VLDL: 16.4 mg/dL (ref 0.0–40.0)

## 2013-09-06 LAB — BASIC METABOLIC PANEL
BUN: 16 mg/dL (ref 6–23)
CALCIUM: 9.5 mg/dL (ref 8.4–10.5)
CO2: 28 mEq/L (ref 19–32)
Chloride: 106 mEq/L (ref 96–112)
Creatinine, Ser: 0.9 mg/dL (ref 0.4–1.2)
GFR: 81.16 mL/min (ref >60.00)
Glucose, Bld: 56 mg/dL — ABNORMAL LOW (ref 70–99)
Potassium: 4 mEq/L (ref 3.5–5.1)
Sodium: 140 mEq/L (ref 135–145)

## 2013-09-06 LAB — HEMOGLOBIN A1C: Hgb A1c MFr Bld: 6.9 % — ABNORMAL HIGH (ref 4.6–6.5)

## 2013-09-06 LAB — TSH: TSH: 0.23 u[IU]/mL — AB (ref 0.35–4.50)

## 2013-09-06 MED ORDER — LEVOTHYROXINE SODIUM 25 MCG PO TABS: 25.0000 ug | ORAL_TABLET | Freq: Every day | ORAL | Status: DC

## 2013-09-06 NOTE — Progress Notes (Signed)
Pre visit review using our clinic review tool, if applicable. No additional management support is needed unless otherwise documented below in the visit note. 

## 2013-09-06 NOTE — Addendum Note (Signed)
Addended by: Johnella Moloney on: 09/06/2013 04:28 PM   Modules accepted: Orders, Medications

## 2013-09-06 NOTE — Progress Notes (Signed)
No chief complaint on file.   HPI:  Follow up:  1)Hypothyroid:  -on synthroid chronically  -stable   2)DM:  -stable  -denies: polyuria, polydipsia, hypoglycemia, foot ulcers, changes in vision  -last eye exam  -home BS: 1 teens - 130s fasting  -current meds:metformin 500mg  bid, glipizide 5mg  daily  -seeing podiatrist   3)HX Pulm Embolism with cardiogenic shock and resp failure in 2014  -followed by pulmonologist (Dr. Delton Coombes) and vascular doctor (Dr. Darrick Penna), recent notes reviewed  -denies: SOB, CP, leg swelling  -on chronic coumadin, uses albuterol prn rarely, pulmicort and atrovent on med list   4)Hx cardiogenic shock, HTN, HLD:  -followed by Dr. Tenny Craw in Cardiology  -on coumadin, statin, metoprolol  -denies: CP, SOB, swelling, palpitations, DOE, orthopnea   5)Knee and Hip OA s/p hip and knee replacements: -wants paperwork for SCAT completed -Sees Dr. Luiz Blare at [facility]opedics -has severe pain in hips and knees when walks more then a few blocks or stands for long periods of time, has difficulty boarding a bus or walking ups hills -uses walker and cane to ambulate  ROS: See pertinent positives and negatives per HPI.  Past Medical History  Diagnosis Date  . Thyroid disease   . DVT (deep venous thrombosis)   . Acute massive pulmonary embolism 07/13/2012    Massive PE w/ PEA arrest 07/13/12 >TNK >IVC filter >discharged on comadin    . Cardiogenic shock 07/13/2012  . Hemorrhagic shock 07/15/2012  . Acute renal failure 07/14/2012  . Acute respiratory failure with hypoxia 07/14/2012  . Hyperlipemia 02/25/2013  . Hypertension 06/06/2013  . Diabetes mellitus with neurological manifestations, controlled 06/06/2013    Past Surgical History  Procedure Laterality Date  . Sp arthro hip*l*    . Knee arthroscopy      No family history on file.  History   Social History  . Marital Status: Widowed    Spouse Name: N/A    Number of Children: N/A  . Years of Education: N/A    Social History Main Topics  . Smoking status: Former Smoker -- 1.00 packs/day for 10 years    Types: Cigarettes    Quit date: 01/04/1968  . Smokeless tobacco: Never Used  . Alcohol Use: No  . Drug Use: No  . Sexual Activity: None   Other Topics Concern  . None   Social History Narrative   Lives alone.           Current outpatient prescriptions:cholecalciferol (VITAMIN D) 1000 UNITS tablet, Take 1,000 Units by mouth daily. , Disp: , Rfl: ;  glipiZIDE (GLUCOTROL) 5 MG tablet, Take 1 tablet (5 mg total) by mouth daily before breakfast., Disp: 90 tablet, Rfl: 3;  glucose blood (ONETOUCH VERIO) test strip, Test daily., Disp: 100 each, Rfl: 12 levothyroxine (SYNTHROID, LEVOTHROID) 50 MCG tablet, Take 1 tablet (50 mcg total) by mouth daily before breakfast., Disp: 90 tablet, Rfl: 3;  metFORMIN (GLUCOPHAGE) 500 MG tablet, Take 1 tablet (500 mg total) by mouth 2 (two) times daily with a meal., Disp: 180 tablet, Rfl: 3;  metoprolol tartrate (LOPRESSOR) 25 MG tablet, Take 1 tablet (25 mg total) by mouth 2 (two) times daily., Disp: 180 tablet, Rfl: 3 simvastatin (ZOCOR) 20 MG tablet, Take 1 tablet (20 mg total) by mouth at bedtime., Disp: 90 tablet, Rfl: 3;  traMADol (ULTRAM) 50 MG tablet, , Disp: , Rfl: ;  vitamin C (ASCORBIC ACID) 500 MG tablet, Take 500 mg by mouth daily. , Disp: , Rfl: ;  VITAMIN  E PO, Take by mouth daily., Disp: , Rfl: ;  warfarin (COUMADIN) 5 MG tablet, Take as directed by coumadin clinic, Disp: 90 tablet, Rfl: 1  EXAM:  Filed Vitals:   09/06/13 0911  BP: 124/84  Pulse: 60  Temp: 97.6 F (36.4 C)    Body mass index is 33.59 kg/(m^2).  GENERAL: vitals reviewed and listed above, alert, oriented, appears well hydrated and in no acute distress  HEENT: atraumatic, conjunttiva clear, no obvious abnormalities on inspection of external nose and ears  NECK: no obvious masses on inspection  LUNGS: clear to auscultation bilaterally, no wheezes, rales or rhonchi, good air  movement  CV: HRRR, no peripheral edema  MS: moves all extremities without noticeable abnormality  PSYCH: pleasant and cooperative, no obvious depression or anxiety  ASSESSMENT AND PLAN:  Discussed the following assessment and plan:  Hyperlipemia - Plan: Lipid Panel  Other specified hypothyroidism  Essential hypertension  Diabetes mellitus with neurological manifestations, controlled - Plan: Hemoglobin A1c, Microalbumin/Creatinine Ratio, Urine, Basic metabolic panel, TSH  Long term (current) use of anticoagulants for hx of massive pulmonary embolism, DVT, cardiogenic shock, respiratory failure 2014 - followed by pulmonologist and cardiologist  S/P hip replacement  Status post total knee replacement, unspecified laterality  -declined flu -foot exam today -recheck BP is: BP is normal -will send disabiity SCAT paperwork to orthopedic doctor managing her orthopedic conditions so properly completed in terms of limitations and disability -my assistant to request ortho notes -Patient advised to return or notify a doctor immediately if symptoms worsen or persist or new concerns arise.  Patient Instructions  -we will contact your orthopedic doctor about the hip and knee pain to see if they can assist with filling this out and will get records from them  -We have ordered labs or studies at this visit. It can take up to 1-2 weeks for results and processing. We will contact you with instructions IF your results are abnormal. Normal results will be released to your [facility]. If you have not heard from [place] or can not find your results in [facility] in 2 weeks please contact our office.  -follow up in 3 months     Hannah [name] R.

## 2013-09-06 NOTE — Patient Instructions (Signed)
-  we will contact your orthopedic doctor about the hip and knee pain to see if they can assist with filling this out and will get records from them  -We have ordered labs or studies at this visit. It can take up to 1-2 weeks for results and processing. We will contact you with instructions IF your results are abnormal. Normal results will be released to your [facility]. If you have not heard from [place] or can not find your results in [facility] in 2 weeks please contact our office.  -follow up in 3 months

## 2013-09-17 ENCOUNTER — Other Ambulatory Visit: Payer: Self-pay

## 2013-09-17 DIAGNOSIS — Z1231 Encounter for screening mammogram for malignant neoplasm of breast: Secondary | ICD-10-CM

## 2013-09-17 DIAGNOSIS — Z9889 Other specified postprocedural states: Secondary | ICD-10-CM

## 2013-09-19 ENCOUNTER — Ambulatory Visit (INDEPENDENT_AMBULATORY_CARE_PROVIDER_SITE_OTHER): Payer: Medicare Other

## 2013-09-19 DIAGNOSIS — I2699 Other pulmonary embolism without acute cor pulmonale: Secondary | ICD-10-CM

## 2013-09-19 DIAGNOSIS — Z7901 Long term (current) use of anticoagulants: Secondary | ICD-10-CM

## 2013-09-19 LAB — POCT INR: INR: 2.3

## 2013-09-30 ENCOUNTER — Ambulatory Visit: Payer: Medicare Other | Admitting: Podiatry

## 2013-10-10 ENCOUNTER — Ambulatory Visit (INDEPENDENT_AMBULATORY_CARE_PROVIDER_SITE_OTHER): Payer: Medicare Other

## 2013-10-10 DIAGNOSIS — I2699 Other pulmonary embolism without acute cor pulmonale: Secondary | ICD-10-CM

## 2013-10-10 DIAGNOSIS — Z7901 Long term (current) use of anticoagulants: Secondary | ICD-10-CM

## 2013-10-10 LAB — POCT INR: INR: 3.4

## 2013-10-14 ENCOUNTER — Ambulatory Visit
Admission: RE | Admit: 2013-10-14 | Discharge: 2013-10-14 | Disposition: A | Discharge status: Home / Self Care | Payer: Medicare Other | Source: Ambulatory Visit

## 2013-10-14 DIAGNOSIS — Z9889 Other specified postprocedural states: Secondary | ICD-10-CM

## 2013-10-14 DIAGNOSIS — Z1231 Encounter for screening mammogram for malignant neoplasm of breast: Secondary | ICD-10-CM

## 2013-10-18 ENCOUNTER — Other Ambulatory Visit: Payer: Self-pay

## 2013-10-29 ENCOUNTER — Ambulatory Visit: Payer: Medicare Other | Admitting: Emergency Medicine

## 2013-11-07 ENCOUNTER — Ambulatory Visit (INDEPENDENT_AMBULATORY_CARE_PROVIDER_SITE_OTHER): Payer: Medicare Other | Admitting: Emergency Medicine

## 2013-11-07 ENCOUNTER — Ambulatory Visit (INDEPENDENT_AMBULATORY_CARE_PROVIDER_SITE_OTHER): Payer: Medicare Other

## 2013-11-07 ENCOUNTER — Encounter: Payer: Self-pay | Admitting: Emergency Medicine

## 2013-11-07 VITALS — BP 118/84 | HR 73 | Temp 98.1°F | Ht 67.0 in | Wt 215.8 lb

## 2013-11-07 DIAGNOSIS — I2699 Other pulmonary embolism without acute cor pulmonale: Secondary | ICD-10-CM

## 2013-11-07 DIAGNOSIS — I272 Pulmonary hypertension, unspecified: Secondary | ICD-10-CM

## 2013-11-07 DIAGNOSIS — Z7901 Long term (current) use of anticoagulants: Secondary | ICD-10-CM

## 2013-11-07 DIAGNOSIS — I270 Primary pulmonary hypertension: Secondary | ICD-10-CM

## 2013-11-07 DIAGNOSIS — Z23 Encounter for immunization: Secondary | ICD-10-CM

## 2013-11-07 LAB — POCT INR: INR: 2.7

## 2013-11-07 NOTE — Patient Instructions (Signed)
Continue your warfarin energy have been taking it We will check your echocardiogram Follow with Dr Delton Coombes in 6 months or sooner if you have any problems

## 2013-11-07 NOTE — Assessment & Plan Note (Signed)
Stable 1-1/2 years status post massive pulmonary embolism. She would like to stay on Coumadin. I will check her echocardiogram to ensure resolution of her RV strain. Follow-up in 6 months

## 2013-11-07 NOTE — Progress Notes (Signed)
Subjective:    Patient ID: Hannah [name], female    DOB: 1940/12/10, 73 y.o.   MRN: [id number]  HPI 73 year old female, former smoker 10 pk-yrs, seen for initial pulmonary consult for bilateral pulmonary embolism. 07/13/2012  post hospital followup 07/31/12 --  Patient returns for a post hospital followup. Patient was admitted July 11 through July 24 for an extensive bilateral pulmonary embolism. Patient developed acute shortness of breath. Prior to admission. Patient presented to the emergency room and underwent a CT scan that showed a central and obstructive bilateral pulmonary was a with right heart strain. Patient did have personally have a PEA arrest in the emergency room requiring 3, rounds of CPR. Patient was given emergent TNK . Patient did experience cardiac and respiratory arrest. She required mechanical ventilation in the ICU.unfortunately patient experienced a second drop in her hemoglobin down to 4. Her heparin was stopped and protamine was administered. Patient did require a packed red blood cell transfusion. CT abdomen and pelvis was negative, except for an intramuscular hematoma on the right. Patient will underwent an IVC filter placement. Venous Doppler was positive for a right leg DVT Patient's hemoglobin improved, and patient was transitioned back over to anticoagulation. On July 17. With a bridge of Lovenox, and warfarin. Patient was discharged with Coumadin. Clinic, following her. Patient's last INR yesterday was 4.0. Her Coumadin was adjusted. Patient denies any known bleeding. She denies any hemoptysis, orthopnea, PND, or leg swelling. Has noticed over the last few days, and her cough has worsened and has been getting some brown, yellow and clear mucus up at times. She denies any fever.  ROV 08/29/12 -- f/u visit for PE's, associated R heart strain. Associated w RLL atx / infiltrate. Repeat CXR today shows persistent mild atx. She is on coumadin. She was hospitalized again for what  sounds like UA irritation. Her coumadin is to be managed at [facility]. Repeat TTE 8/4 shows improvement in PASP compared with 7/11. CT scan 08/05/12 with subcutaneous chest wall nodules, no bx done to date, deferred.   ROV 03/22/13 -- f/u visit for PE's, associated R heart strain. She remains on coumadin and we are planning for life long. She has been having some R arm tingling - not all the time. ? A cervical radiculopathy. No real breathing difficulty - has used SABA rarely, not every day.   ROV 11/07/13 -- follow up visit for hx PE and associated PAH, R heart strain.  She has been on anticoag for 1.5 yrs. We discussed xarelto at a previous visit, she would like to stay on the warfarin.  She is active, able to get around. She does get tired quickly.     Review of Systems     Objective:   Physical Exam Filed Vitals:   11/07/13 1402  BP: 118/84  Pulse: 73  Temp: 98.1 F (36.7 C)  TempSrc: Oral  Height: 5\' 7"  (1.702 m)  Weight: 215 lb 12.8 oz (97.886 kg)  SpO2: 97%   GEN: A/Ox3; pleasant , NAD  HEENT:  [place]/AT,  EACs-clear, TMs-wnl, NOSE-clear, THROAT-clear, no lesions, no postnasal drip or exudate noted.   NECK:  Supple w/ fair ROM; no JVD; normal carotid impulses w/o bruits; no thyromegaly or nodules palpated; no lymphadenopathy. RESP  Clear  P & A; w/o, wheezes/ rales/ or rhonchi.no accessory muscle use, no dullness to percussion  CARD:  RRR, no m/r/g  , tr peripheral edema, pulses intact, no cyanosis or clubbing.  GI:   Soft &  nt; nml bowel sounds; no organomegaly or masses detected.  Musco: Warm bil, no deformities or joint swelling noted.   Neuro: alert, no focal deficits noted.    Skin: Warm, no lesions or rashes     Assessment & Plan:  Embolism, pulmonary with infarction Stable 1-1/2 years status post massive pulmonary embolism. She would like to stay on Coumadin. I will check her echocardiogram to ensure resolution of her RV strain. Follow-up in 6 months

## 2013-11-11 ENCOUNTER — Other Ambulatory Visit (HOSPITAL_COMMUNITY): Payer: Self-pay | Admitting: Emergency Medicine

## 2013-11-11 DIAGNOSIS — I272 Pulmonary hypertension, unspecified: Secondary | ICD-10-CM

## 2013-11-12 ENCOUNTER — Ambulatory Visit (HOSPITAL_COMMUNITY): Payer: Medicare Other | Attending: Cardiology

## 2013-11-12 DIAGNOSIS — I270 Primary pulmonary hypertension: Secondary | ICD-10-CM

## 2013-11-12 DIAGNOSIS — E119 Type 2 diabetes mellitus without complications: Secondary | ICD-10-CM | POA: Diagnosis not present

## 2013-11-12 DIAGNOSIS — Z87891 Personal history of nicotine dependence: Secondary | ICD-10-CM | POA: Diagnosis not present

## 2013-11-12 DIAGNOSIS — I10 Essential (primary) hypertension: Secondary | ICD-10-CM | POA: Insufficient documentation

## 2013-11-12 DIAGNOSIS — E785 Hyperlipidemia, unspecified: Secondary | ICD-10-CM | POA: Insufficient documentation

## 2013-11-12 DIAGNOSIS — I272 Pulmonary hypertension, unspecified: Secondary | ICD-10-CM

## 2013-11-12 NOTE — Progress Notes (Signed)
2D Echo completed. 11/12/2013 

## 2013-12-02 ENCOUNTER — Telehealth: Payer: Self-pay

## 2013-12-02 NOTE — Telephone Encounter (Signed)
Rx request for [facility] for levothyroxine 25. Pls advise.

## 2013-12-03 MED ORDER — LEVOTHYROXINE SODIUM 25 MCG PO TABS: 25.0000 ug | ORAL_TABLET | Freq: Every day | ORAL | Status: DC

## 2013-12-03 NOTE — Telephone Encounter (Signed)
Rx done. 

## 2013-12-04 ENCOUNTER — Telehealth: Payer: Self-pay | Admitting: Emergency Medicine

## 2013-12-04 NOTE — Telephone Encounter (Signed)
Called and spoke to pt. Pt stated she received a letter in the mail stating Dr. Delton Coombes needs to clarify why the pt had the echo. Called and spoke to [place] at [facility] at [phone]. Denny Peon stated there are no current claims or anything that needs to be done at this time. Called and informed the pt of the status. Informed the pt to make a copy of the letter and mail it to [place] just to cover all bases to assure the insurance will cover the procedure and then if needed we can call the insurance company back. Pt stated she will have her daughter mail [place] the letter.   Will forward to [place] to look out for letter.

## 2013-12-05 ENCOUNTER — Ambulatory Visit (INDEPENDENT_AMBULATORY_CARE_PROVIDER_SITE_OTHER): Payer: Medicare Other | Admitting: Pharmacist

## 2013-12-05 DIAGNOSIS — I2699 Other pulmonary embolism without acute cor pulmonale: Secondary | ICD-10-CM

## 2013-12-05 DIAGNOSIS — Z7901 Long term (current) use of anticoagulants: Secondary | ICD-10-CM

## 2013-12-05 LAB — POCT INR: INR: 2.8

## 2013-12-06 ENCOUNTER — Ambulatory Visit (INDEPENDENT_AMBULATORY_CARE_PROVIDER_SITE_OTHER): Payer: Medicare Other | Admitting: Family Medicine

## 2013-12-06 ENCOUNTER — Encounter: Payer: Self-pay | Admitting: Family Medicine

## 2013-12-06 VITALS — BP 128/84 | HR 66 | Temp 97.5°F | Ht 67.0 in | Wt 210.2 lb

## 2013-12-06 DIAGNOSIS — E1149 Type 2 diabetes mellitus with other diabetic neurological complication: Secondary | ICD-10-CM

## 2013-12-06 DIAGNOSIS — E785 Hyperlipidemia, unspecified: Secondary | ICD-10-CM

## 2013-12-06 DIAGNOSIS — E038 Other specified hypothyroidism: Secondary | ICD-10-CM

## 2013-12-06 DIAGNOSIS — I10 Essential (primary) hypertension: Secondary | ICD-10-CM

## 2013-12-06 NOTE — Progress Notes (Signed)
Pre visit review using our clinic review tool, if applicable. No additional management support is needed unless otherwise documented below in the visit note. 

## 2013-12-06 NOTE — Progress Notes (Signed)
HPI:  HM: shingles vaccine offered - declined  1)Hypothyroid:  -on synthroid chronically  -low dose and recent change -need to recheck TSH today -reports:feels well -denies: constipation, hot/cold intol, palpitations, skin or nail changes  2)DM:  -stable  -denies: polyuria, polydipsia, hypoglycemia, foot ulcers, changes in vision  -does senior exercise 2 days per week -last eye exam: out of date in chart -foot exam: needs today -home BS: 1 teens - 130s fasting  -current meds:metformin 500mg  bid, glipizide 5mg  daily  -seeing podiatrist   3)HX Pulm Embolism with cardiogenic shock and resp failure in 2014  -followed by pulmonologist (Dr. Delton Coombes) and vascular doctor (Dr. Darrick Penna), recent notes reviewed  -denies: SOB, CP, leg swelling  -on chronic coumadin, uses albuterol prn rarely, pulmicort and atrovent on med list  4)Hx cardiogenic shock, HTN, HLD:  -followed by Dr. Tenny Craw in Cardiology  -on coumadin, statin, metoprolol  -denies: CP, SOB, swelling, palpitations, DOE, orthopnea   5)Knee and Hip OA s/p hip and knee replacements: -wants paperwork for SCAT completed -Sees Dr. Luiz Blare at [facility] orthopedics -has severe pain in hips and knees when walks more then a few blocks or stands for long periods of time, has difficulty boarding a bus or walking ups hills -uses walker and cane to ambulate   ROS: See pertinent positives and negatives per HPI.  Past Medical History  Diagnosis Date  . Thyroid disease   . DVT (deep venous thrombosis)   . Acute massive pulmonary embolism 07/13/2012    Massive PE w/ PEA arrest 07/13/12 >TNK >IVC filter >discharged on comadin    . Cardiogenic shock 07/13/2012  . Hemorrhagic shock 07/15/2012  . Acute renal failure 07/14/2012  . Acute respiratory failure with hypoxia 07/14/2012  . Hyperlipemia 02/25/2013  . Hypertension 06/06/2013  . Diabetes mellitus with neurological manifestations, controlled 06/06/2013    Past Surgical History   Procedure Laterality Date  . Sp arthro hip*l*    . Knee arthroscopy      No family history on file.  History   Social History  . Marital Status: Widowed    Spouse Name: N/A    Number of Children: N/A  . Years of Education: N/A   Social History Main Topics  . Smoking status: Former Smoker -- 1.00 packs/day for 10 years    Types: Cigarettes    Quit date: 01/04/1968  . Smokeless tobacco: Never Used  . Alcohol Use: No  . Drug Use: No  . Sexual Activity: None   Other Topics Concern  . None   Social History Narrative   Lives alone.           Current outpatient prescriptions: cholecalciferol (VITAMIN D) 1000 UNITS tablet, Take 1,000 Units by mouth daily. , Disp: , Rfl: ;  glipiZIDE (GLUCOTROL) 5 MG tablet, Take 1 tablet (5 mg total) by mouth daily before breakfast., Disp: 90 tablet, Rfl: 3;  glucose blood (ONETOUCH VERIO) test strip, Test daily., Disp: 100 each, Rfl: 12 levothyroxine (SYNTHROID) 25 MCG tablet, Take 1 tablet (25 mcg total) by mouth daily before breakfast., Disp: 90 tablet, Rfl: 0;  metFORMIN (GLUCOPHAGE) 500 MG tablet, Take 1 tablet (500 mg total) by mouth 2 (two) times daily with a meal., Disp: 180 tablet, Rfl: 3;  metoprolol tartrate (LOPRESSOR) 25 MG tablet, Take 1 tablet (25 mg total) by mouth 2 (two) times daily., Disp: 180 tablet, Rfl: 3 simvastatin (ZOCOR) 20 MG tablet, Take 1 tablet (20 mg total) by mouth at bedtime., Disp: 90 tablet, Rfl: 3;  vitamin C (ASCORBIC ACID) 500 MG tablet, Take 500 mg by mouth daily. , Disp: , Rfl: ;  VITAMIN E PO, Take by mouth daily., Disp: , Rfl: ;  warfarin (COUMADIN) 5 MG tablet, Take as directed by coumadin clinic, Disp: 90 tablet, Rfl: 1  EXAM:  Filed Vitals:   12/06/13 1342  BP: 128/84  Pulse: 66  Temp: 97.5 F (36.4 C)    Body mass index is 32.91 kg/(m^2).  GENERAL: vitals reviewed and listed above, alert, oriented, appears well hydrated and in no acute distress  HEENT: atraumatic, conjunttiva clear, no obvious  abnormalities on inspection of external nose and ears  NECK: no obvious masses on inspection  LUNGS: clear to auscultation bilaterally, no wheezes, rales or rhonchi, good air movement  CV: HRRR, no peripheral edema  MS: moves all extremities without noticeable abnormality  PSYCH: pleasant and cooperative, no obvious depression or anxiety  ASSESSMENT AND PLAN:  Discussed the following assessment and plan:  Other specified hypothyroidism - Plan: TSH  Diabetes mellitus with neurological manifestations, controlled - Plan: Basic metabolic panel, Hemoglobin A1c, Microalbumin/Creatinine Ratio, Urine  Hyperlipemia  Essential hypertension - Plan: Basic metabolic panel  -labs today -not fasting so no cholesterol today -diabetic foot exam done - advised she see her podiatrist for nail trimming -advised optho exam, healthy diet, regular exercise as tolerated -Patient advised to return or notify a doctor immediately if symptoms worsen or persist or new concerns arise.  There are no Patient Instructions on file for this visit.   Kriste BasqueKIM, Faheem Ziemann R.

## 2013-12-06 NOTE — Telephone Encounter (Signed)
I have not seen this letter

## 2013-12-06 NOTE — Patient Instructions (Signed)
BEFORE YOU LEAVE: -labs -check if want shingles vaccine -3-4 month follow up  Schedule eye doctor exam  Schedule visit with podiatrist  FOR YOUR DIABETES:  []  Eat a healthy low carb diet (avoid sweets, sweet drinks, breads, potatoes, rice, etc.) and ensure 3 small meals daily.  []  Get AT LEAST 150 minutes of cardiovascular exercise per week - 30 minutes per day is best of sustained sweaty exercise.  []  Take all of your medications every day as directed by your doctor. Call your doctor immediately if you have any questions about your medications or are running low.  []  Check your blood sugar often and when you feel unwell and keep a log to bring to all health appointments. FASTING: before you eat anything in the morning POSTPRANDIAL: 1-2 hours after a meal  []  If any low blood sugars < 70, eat a snack and call your doctor immediately.  []  See an eye doctor every year and fax your diabetic eye exam to our office.  Fax: [phone]  []  Take good care of your feet and keep them soft and callus free. Check your feet daily and wear comfortable shoes. See your doctor immediately if you have any cuts, calluses or wounds on your feet.

## 2013-12-07 LAB — TSH: TSH: 1.31 u[IU]/mL (ref 0.35–4.50)

## 2013-12-08 LAB — BASIC METABOLIC PANEL
BUN: 16 mg/dL (ref 6–23)
CALCIUM: 9.5 mg/dL (ref 8.4–10.5)
CO2: 27 mEq/L (ref 19–32)
Chloride: 105 mEq/L (ref 96–112)
Creatinine, Ser: 0.9 mg/dL (ref 0.4–1.2)
GFR: 78.03 mL/min (ref >60.00)
GLUCOSE: 74 mg/dL (ref 70–99)
POTASSIUM: 4.1 meq/L (ref 3.5–5.1)
SODIUM: 140 meq/L (ref 135–145)

## 2013-12-08 LAB — HEMOGLOBIN A1C: HEMOGLOBIN A1C: 7.6 % — AB (ref 4.6–6.5)

## 2013-12-08 LAB — MICROALBUMIN / CREATININE URINE RATIO
CREATININE, U: 56.3 mg/dL
MICROALB/CREAT RATIO: 3.7 mg/g (ref 0.0–30.0)
Microalb, Ur: 2.1 mg/dL — ABNORMAL HIGH (ref 0.0–1.9)

## 2013-12-09 NOTE — Telephone Encounter (Signed)
Checked Dr. Kavin Leech box and I still have not seen this letter.

## 2013-12-10 MED ORDER — METFORMIN HCL 500 MG PO TABS: ORAL_TABLET | ORAL | Status: DC

## 2013-12-10 NOTE — Telephone Encounter (Signed)
LMTC x 1 for pt to let her know that we still have not received a letter.

## 2013-12-10 NOTE — Addendum Note (Signed)
Addended by: Johnella Moloney on: 12/10/2013 08:36 AM   Modules accepted: Orders, Medications

## 2013-12-11 NOTE — Telephone Encounter (Signed)
Called patient and advised her that I received the letter and put in Dr. Kavin Leech box for review.  Advised her that it may be late next week before we will get back in touch with her.

## 2013-12-11 NOTE — Telephone Encounter (Signed)
I went to mail room to check for letter; letter has been received and given to [facility] to take care of this with RB.

## 2013-12-12 ENCOUNTER — Encounter (HOSPITAL_COMMUNITY): Payer: Self-pay | Admitting: Vascular Surgery

## 2013-12-20 NOTE — Telephone Encounter (Signed)
RB, have you had a chance to review this letter?

## 2013-12-23 NOTE — Telephone Encounter (Signed)
Have not seen letter since placed in Dr. Kavin Leech box.  RB, have you seen this letter?

## 2013-12-24 NOTE — Telephone Encounter (Signed)
Pt aware of below.  Will forward to RB to address letter next wk.

## 2013-12-24 NOTE — Telephone Encounter (Signed)
Please let her know that I will review when I return next week, but our initial interpretation is that there are no problems or claims that need to be addressed.

## 2014-01-08 NOTE — Telephone Encounter (Signed)
Please call her (non-urgent) to see if she has received any more feedback about whether her echocardiogram is covered. As best I can tell there should be no issue. Thanks.

## 2014-01-08 NOTE — Telephone Encounter (Signed)
Pt states that she has not heard back yet if it is going to be covered. Will let us know as soon as she hears.

## 2014-01-08 NOTE — Telephone Encounter (Signed)
Ok thank you 

## 2014-01-16 ENCOUNTER — Emergency Department (HOSPITAL_COMMUNITY)
Admission: EM | Admit: 2014-01-16 | Discharge: 2014-01-16 | Disposition: A | Discharge status: Home / Self Care | Payer: Medicare Other | Attending: Emergency Medicine | Admitting: Emergency Medicine

## 2014-01-16 ENCOUNTER — Ambulatory Visit (INDEPENDENT_AMBULATORY_CARE_PROVIDER_SITE_OTHER): Payer: Medicare Other

## 2014-01-16 ENCOUNTER — Encounter (HOSPITAL_COMMUNITY): Payer: Self-pay | Admitting: *Deleted

## 2014-01-16 ENCOUNTER — Telehealth: Payer: Self-pay | Admitting: Family Medicine

## 2014-01-16 DIAGNOSIS — I2699 Other pulmonary embolism without acute cor pulmonale: Secondary | ICD-10-CM

## 2014-01-16 DIAGNOSIS — Y998 Other external cause status: Secondary | ICD-10-CM | POA: Insufficient documentation

## 2014-01-16 DIAGNOSIS — S161XXA Strain of muscle, fascia and tendon at neck level, initial encounter: Secondary | ICD-10-CM | POA: Diagnosis not present

## 2014-01-16 DIAGNOSIS — Y929 Unspecified place or not applicable: Secondary | ICD-10-CM | POA: Diagnosis not present

## 2014-01-16 DIAGNOSIS — Y9389 Activity, other specified: Secondary | ICD-10-CM | POA: Insufficient documentation

## 2014-01-16 DIAGNOSIS — Z7901 Long term (current) use of anticoagulants: Secondary | ICD-10-CM | POA: Insufficient documentation

## 2014-01-16 DIAGNOSIS — X58XXXA Exposure to other specified factors, initial encounter: Secondary | ICD-10-CM | POA: Insufficient documentation

## 2014-01-16 DIAGNOSIS — Z87448 Personal history of other diseases of urinary system: Secondary | ICD-10-CM | POA: Diagnosis not present

## 2014-01-16 DIAGNOSIS — Z8709 Personal history of other diseases of the respiratory system: Secondary | ICD-10-CM | POA: Insufficient documentation

## 2014-01-16 DIAGNOSIS — E785 Hyperlipidemia, unspecified: Secondary | ICD-10-CM | POA: Diagnosis not present

## 2014-01-16 DIAGNOSIS — E119 Type 2 diabetes mellitus without complications: Secondary | ICD-10-CM | POA: Insufficient documentation

## 2014-01-16 DIAGNOSIS — I10 Essential (primary) hypertension: Secondary | ICD-10-CM | POA: Diagnosis not present

## 2014-01-16 DIAGNOSIS — Z79899 Other long term (current) drug therapy: Secondary | ICD-10-CM | POA: Diagnosis not present

## 2014-01-16 DIAGNOSIS — Z87891 Personal history of nicotine dependence: Secondary | ICD-10-CM | POA: Diagnosis not present

## 2014-01-16 DIAGNOSIS — R9431 Abnormal electrocardiogram [ECG] [EKG]: Secondary | ICD-10-CM | POA: Diagnosis not present

## 2014-01-16 DIAGNOSIS — Z86718 Personal history of other venous thrombosis and embolism: Secondary | ICD-10-CM | POA: Diagnosis not present

## 2014-01-16 DIAGNOSIS — E079 Disorder of thyroid, unspecified: Secondary | ICD-10-CM | POA: Insufficient documentation

## 2014-01-16 DIAGNOSIS — M542 Cervicalgia: Secondary | ICD-10-CM | POA: Diagnosis present

## 2014-01-16 LAB — CBC WITH DIFFERENTIAL/PLATELET
BASOS ABS: 0 10*3/uL (ref 0.0–0.1)
Basophils Relative: 0 % (ref 0–1)
EOS PCT: 2 % (ref 0–5)
Eosinophils Absolute: 0.2 10*3/uL (ref 0.0–0.7)
HCT: 45 % (ref 36.0–46.0)
Hemoglobin: 14.6 g/dL (ref 12.0–15.0)
Lymphocytes Relative: 35 % (ref 12–46)
Lymphs Abs: 3.1 10*3/uL (ref 0.7–4.0)
MCH: 28.3 pg (ref 26.0–34.0)
MCHC: 32.4 g/dL (ref 30.0–36.0)
MCV: 87.4 fL (ref 78.0–100.0)
Monocytes Absolute: 0.5 10*3/uL (ref 0.1–1.0)
Monocytes Relative: 6 % (ref 3–12)
Neutro Abs: 5.2 10*3/uL (ref 1.7–7.7)
Neutrophils Relative %: 57 % (ref 43–77)
PLATELETS: 248 10*3/uL (ref 150–400)
RBC: 5.15 MIL/uL — ABNORMAL HIGH (ref 3.87–5.11)
RDW: 14.9 % (ref 11.5–15.5)
WBC: 9 10*3/uL (ref 4.0–10.5)

## 2014-01-16 LAB — COMPREHENSIVE METABOLIC PANEL
ALK PHOS: 63 U/L (ref 39–117)
ALT: 14 U/L (ref 0–35)
ANION GAP: 9 (ref 5–15)
AST: 20 U/L (ref 0–37)
Albumin: 3.6 g/dL (ref 3.5–5.2)
BUN: 14 mg/dL (ref 6–23)
CO2: 25 mmol/L (ref 19–32)
Calcium: 9.4 mg/dL (ref 8.4–10.5)
Chloride: 107 mEq/L (ref 96–112)
Creatinine, Ser: 0.98 mg/dL (ref 0.50–1.10)
GFR, EST AFRICAN AMERICAN: 65 mL/min — AB (ref >90)
GFR, EST NON AFRICAN AMERICAN: 56 mL/min — AB (ref >90)
GLUCOSE: 161 mg/dL — AB (ref 70–99)
Potassium: 4.2 mmol/L (ref 3.5–5.1)
Sodium: 141 mmol/L (ref 135–145)
Total Bilirubin: 0.5 mg/dL (ref 0.3–1.2)
Total Protein: 7 g/dL (ref 6.0–8.3)

## 2014-01-16 LAB — PROTIME-INR
INR: 2.62 — ABNORMAL HIGH (ref 0.00–1.49)
PROTHROMBIN TIME: 28.2 s — AB (ref 11.6–15.2)

## 2014-01-16 LAB — POCT INR: INR: 3.2

## 2014-01-16 MED ORDER — HYDROCODONE-ACETAMINOPHEN 5-325 MG PO TABS: 2.0000 | ORAL_TABLET | ORAL | Status: DC | PRN

## 2014-01-16 NOTE — ED Notes (Signed)
Pt. Refused wheelchair and left with all belongings 

## 2014-01-16 NOTE — ED Notes (Signed)
Pt in with family c/o left neck pain, pain is worse with movement, started behind her left ear and moves down her neck, no distress noted.

## 2014-01-16 NOTE — Discharge Instructions (Signed)
Please call your doctor for a followup appointment within 24-48 hours. When you talk to your doctor please let them know that you were seen in the emergency department and have them acquire all of your records so that they can discuss the findings with you and formulate a treatment plan to fully care for your new and ongoing problems.  Any medication that you take will change the way that Coumadin works, this is similar to this pain medication as it will change this a very small amount. I have discussed this with the pharmacist who agrees that you should be able to take his pain medication with only a very small change in the Coumadin. Please follow-up in 1 week with her family doctor to have them recheck your Coumadin level.

## 2014-01-16 NOTE — Telephone Encounter (Signed)
Patient Name: Hannah [name] DOB: [birth year] Nurse Assessment Nurse: Ladona Ridgel RN, Felicia Date/Time (Eastern Time): 01/16/2014 4:36:10 PM Confirm and document reason for call. If symptomatic, describe symptoms. ---Caller conferenced pt into the call. PT has HA from back of L ear to neck. Feels funny on L side of head and it came on 2 d ago and she is taking tylenol. Now the pain level is about 4-5 and it comes and goes. no fever Has the patient traveled out of the country within the last 30 days? ---No Does the patient require triage? ---Yes Related visit to physician within the last 2 weeks? ---No Does the PT have any chronic conditions? (i.e. diabetes, asthma, etc.) ---Yes List chronic conditions. ---DM Guidelines Guideline Title Affirmed Question Affirmed NotesGuideline Title Affirmed Question Affirmed Notes Headache Stiff neck (can't touch chin to chest) Final Disposition User Go to ED Now Ladona Ridgel, RN, Sunny Schlein Comments her neck is stiffer than usual PLEASE NOTE: All timestamps contained within this report are represented as [place] Standard Time. CONFIDENTIALTY NOTICE: This fax transmission is intended only for the addressee. It contains information that is legally privileged, confidential or otherwise protected from use or disclosure. If you are not the intended recipient, you are strictly prohibited from reviewing, disclosing, copying using or disseminating any of this information or taking any action in reliance on or regarding this information. If you have received this fax in error, please notify us immediately by telephone so that we can arrange for its return to [place]. Phone: [phone], Toll-Free: [phone], Fax: [phone] Page: 1 of 1 Call Id: [id number]

## 2014-01-16 NOTE — ED Provider Notes (Signed)
CSN: [id number]     Arrival date & time 01/16/14  1708 History   First MD Initiated Contact with Patient 01/16/14 2147     Chief Complaint  Patient presents with  . Neck Pain     (Consider location/radiation/quality/duration/timing/severity/associated sxs/prior Treatment) HPI Comments: The patient is a 74 year old female, she has a history of DVT and pulmonary embolism, she was in the hospital for over 3 weeks one year ago and has been on Coumadin since that time because of pulmonary embolism and DVT. She has not been having any consultations of that whatsoever however 4 days ago she developed left neck pain at the mastoid process behind the left ear which is persistent, worse with moving her head from side to side and worse with palpation over this area. She denies associated swelling of the neck, swelling of the ear, tinnitus, ear pain, discharge, drainage, redness or tenderness of the ear itself. She has no coughing, shortness of breath, chest pain and has been taking Tylenol without relief. She denies any injuries, car accidents or sudden neck movements and denies sleeping on her head in an abnormal way.  Patient is a 74 y.o. female presenting with neck pain. The history is provided by the patient.  Neck Pain   Past Medical History  Diagnosis Date  . Thyroid disease   . DVT (deep venous thrombosis)   . Acute massive pulmonary embolism 07/13/2012    Massive PE w/ PEA arrest 07/13/12 >TNK >IVC filter >discharged on comadin    . Cardiogenic shock 07/13/2012  . Hemorrhagic shock 07/15/2012  . Acute renal failure 07/14/2012  . Acute respiratory failure with hypoxia 07/14/2012  . Hyperlipemia 02/25/2013  . Hypertension 06/06/2013  . Diabetes mellitus with neurological manifestations, controlled 06/06/2013   Past Surgical History  Procedure Laterality Date  . Sp arthro hip*l*    . Knee arthroscopy    . Insertion of vena cava filter N/A 07/16/2012    Procedure: INSERTION OF VENA CAVA FILTER;   Surgeon: [name], MD;  Location: [facility] CATH LAB;  Service: Cardiovascular;  Laterality: N/A;   History reviewed. No pertinent family history. History  Substance Use Topics  . Smoking status: Former Smoker -- 1.00 packs/day for 10 years    Types: Cigarettes    Quit date: 01/04/1968  . Smokeless tobacco: Never Used  . Alcohol Use: No   OB History    No data available     Review of Systems  Musculoskeletal: Positive for neck pain.  All other systems reviewed and are negative.     Allergies  Augmentin and Tranxene  Home Medications   Prior to Admission medications   Medication Sig Start Date End Date Taking? Authorizing Provider  cholecalciferol (VITAMIN D) 1000 UNITS tablet Take 1,000 Units by mouth daily.     Historical Provider, MD  glipiZIDE (GLUCOTROL) 5 MG tablet Take 1 tablet (5 mg total) by mouth daily before breakfast. 06/18/13   [name], DO  glucose blood (ONETOUCH VERIO) test strip Test daily. 02/25/13   [name], DO  HYDROcodone-acetaminophen (NORCO/VICODIN) 5-325 MG per tablet Take 2 tablets by mouth every 4 (four) hours as needed. 01/16/14   Vida Roller, MD  levothyroxine (SYNTHROID) 25 MCG tablet Take 1 tablet (25 mcg total) by mouth daily before breakfast. 12/03/13   [name], DO  metFORMIN (GLUCOPHAGE) 500 MG tablet Take 2 tablets by mouth at breakfast and one at dinner 12/10/13   [name], DO  metoprolol  tartrate (LOPRESSOR) 25 MG tablet Take 1 tablet (25 mg total) by mouth 2 (two) times daily. 06/06/13   [name], DO  simvastatin (ZOCOR) 20 MG tablet Take 1 tablet (20 mg total) by mouth at bedtime. 06/06/13   [name], DO  vitamin C (ASCORBIC ACID) 500 MG tablet Take 500 mg by mouth daily.     Historical Provider, MD  VITAMIN E PO Take by mouth daily.    Historical Provider, MD  warfarin (COUMADIN) 5 MG tablet Take as directed by coumadin clinic 08/15/13   Kalman Shan, MD   BP 127/62 mmHg  Pulse 69  Temp(Src) 98.3 F (36.8 C)   Resp 18  SpO2 99% Physical Exam  Constitutional: She appears well-developed and well-nourished. No distress.  HENT:  Head: Normocephalic and atraumatic.  Mouth/Throat: Oropharynx is clear and moist. No oropharyngeal exudate.  Normal appearing tympanic membranes bilaterally, no trismus or torticollis  Eyes: Conjunctivae and EOM are normal. Pupils are equal, round, and reactive to light. Right eye exhibits no discharge. Left eye exhibits no discharge. No scleral icterus.  Neck: Normal range of motion. Neck supple. No JVD present. No thyromegaly present.  Tender along the proximal edge of the left sternocleidomastoid, no redness, no swelling  Cardiovascular: Normal rate, regular rhythm, normal heart sounds and intact distal pulses.  Exam reveals no gallop and no friction rub.   No murmur heard. Pulmonary/Chest: Effort normal and breath sounds normal. No respiratory distress. She has no wheezes. She has no rales.  Abdominal: Soft. Bowel sounds are normal. She exhibits no distension and no mass. There is no tenderness.  Musculoskeletal: Normal range of motion. She exhibits no edema or tenderness.  Lymphadenopathy:    She has no cervical adenopathy.  Neurological: She is alert. Coordination normal.  Skin: Skin is warm and dry. No rash noted. No erythema.  Psychiatric: She has a normal mood and affect. Her behavior is normal.  Nursing note and vitals reviewed.   ED Course  Procedures (including critical care time) Labs Review Labs Reviewed  CBC WITH DIFFERENTIAL - Abnormal; Notable for the following:    RBC 5.15 (*)    All other components within normal limits  COMPREHENSIVE METABOLIC PANEL - Abnormal; Notable for the following:    Glucose, Bld 161 (*)    GFR calc non Af Amer 56 (*)    GFR calc Af Amer 65 (*)    All other components within normal limits  PROTIME-INR - Abnormal; Notable for the following:    Prothrombin Time 28.2 (*)    INR 2.62 (*)    All other components within  normal limits    Imaging Review No results found.   EKG Interpretation   Date/Time:  Thursday January 16 2014 17:21:33 EST Ventricular Rate:  69 PR Interval:  148 QRS Duration: 124 QT Interval:  366 QTC Calculation: 392 R Axis:   -52 Text Interpretation:  Normal sinus rhythm Left anterior fascicular block  Left ventricular hypertrophy with QRS widening Cannot rule out Septal  infarct , age undetermined Abnormal ECG Since last tracing Left  ventricular hypertrophy NOW PRESENT Confirmed by Hyacinth Meeker  MD, Pierre Cumpton ([id number])  on 01/16/2014 9:52:55 PM      MDM   Final diagnoses:  Neck strain, initial encounter    The patient has tenderness along the stroke light of mastoid, vital signs are normal, exam is otherwise unremarkable, EKG shows some left ventricular hypertrophy but overall is not significant change from prior. Will treat with  pain medications, she does have a history of DVT and thus I will discuss her care with the pharmacist regarding the appropriate medication to order for ongoing pain control. No significant pain medication allergies. The patient is in agreement with the plan.  Meds given in ED:  Medications - No data to display  New Prescriptions   HYDROCODONE-ACETAMINOPHEN (NORCO/VICODIN) 5-325 MG PER TABLET    Take 2 tablets by mouth every 4 (four) hours as needed.      Vida Roller, MD 01/16/14 2215

## 2014-01-23 IMAGING — CT CT ANGIO CHEST
2 of 7 series · 18 of 36 positions shown · IV contrast (CONTRAST)
Comparison: CTA chest 07/13/2012.

CLINICAL DATA: Recurrence of shortness of breath and patient with
prior bilateral pulmonary emboli on 07/13/2012.

CT ANGIOGRAPHY CHEST
TECHNIQUE: Multidetector CT imaging of the chest using the
standard protocol during bolus administration of intravenous
contrast. Multiplanar reconstructed images including MIPs were
obtained and reviewed to evaluate the vascular anatomy.
Contrast: 60mL OMNIPAQUE IOHEXOL 350 MG/ML IV.

[Series 5: pe thins · axial · 0.64mm/px · z∈[-232,-19]mm · 17 of 241 slices shown]
[im 14/241  lung]
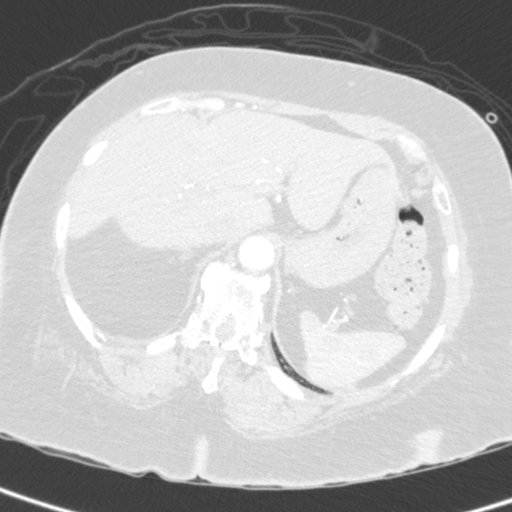
[im 27/241  mediastinal]
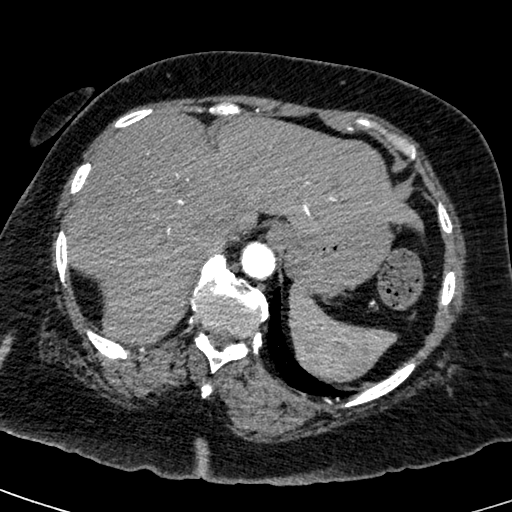
[im 41/241  lung]
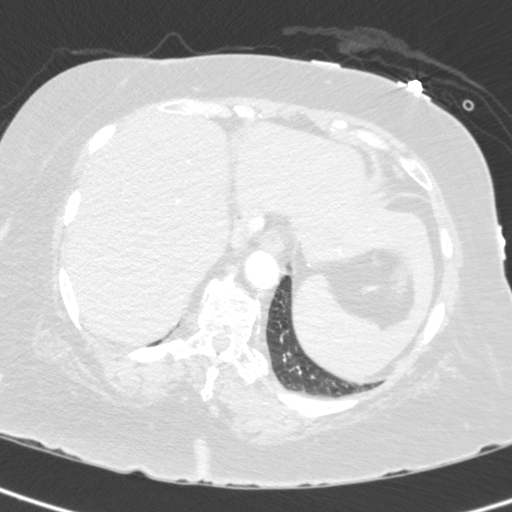
[im 54/241  mediastinal]
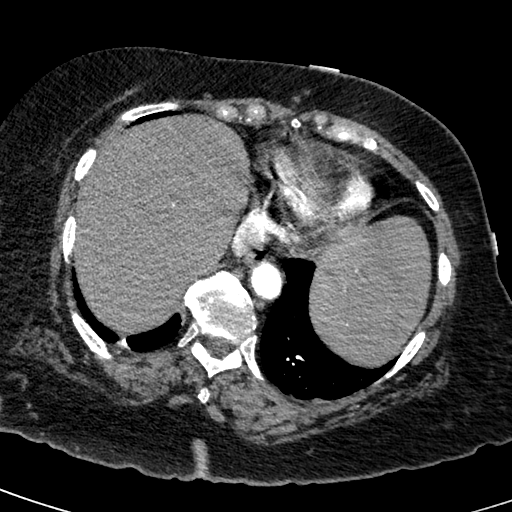
[im 67/241  lung]
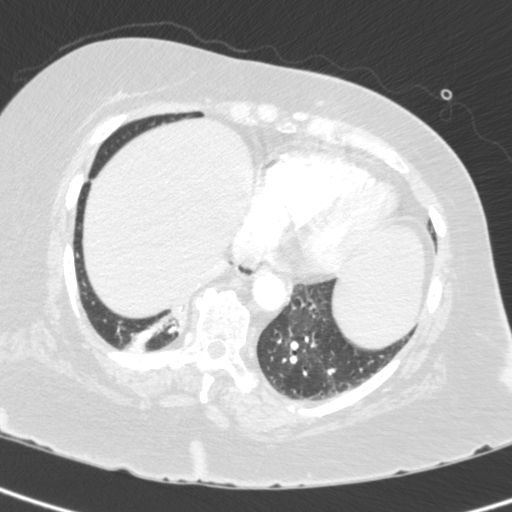
[im 81/241  mediastinal]
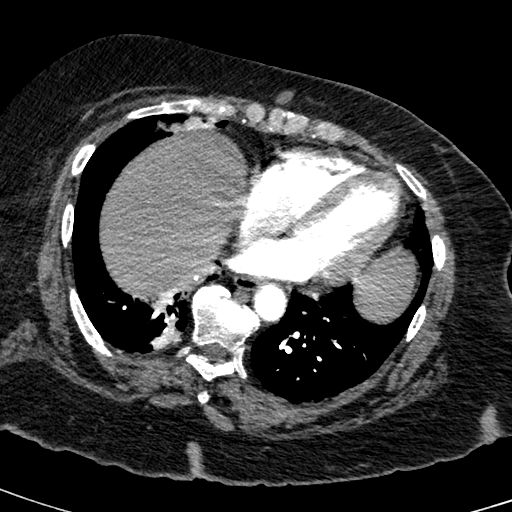
[im 94/241  lung]
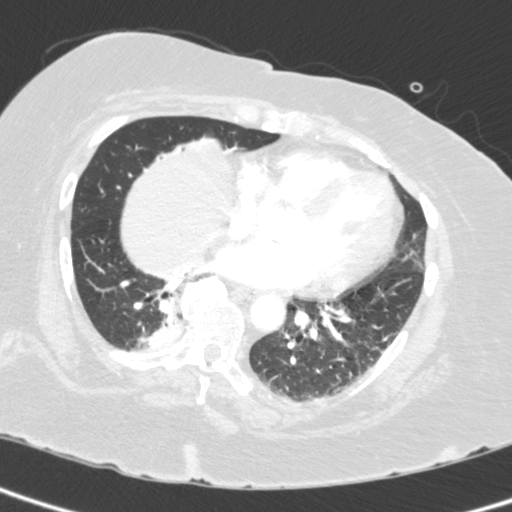
[im 107/241  mediastinal]
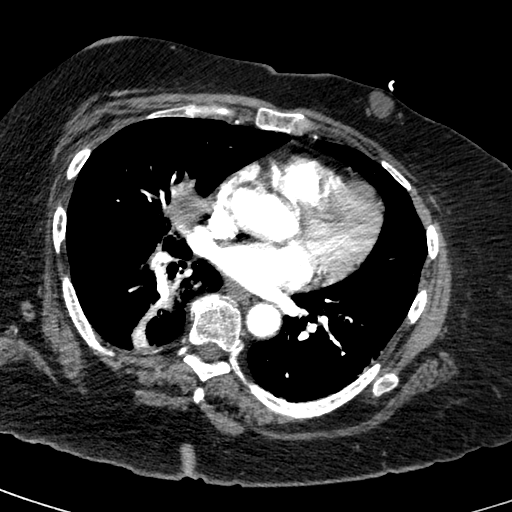
[im 121/241  lung]
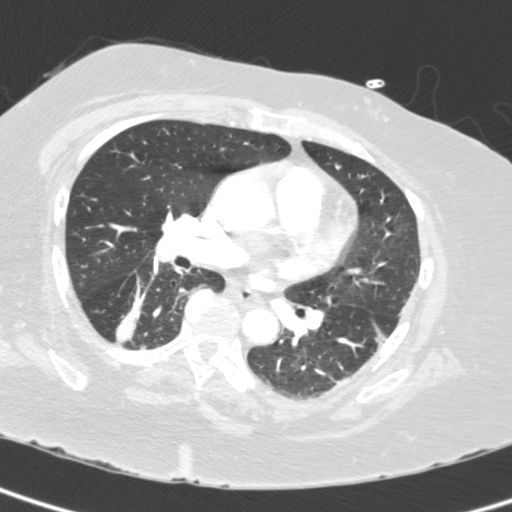
[im 134/241  mediastinal]
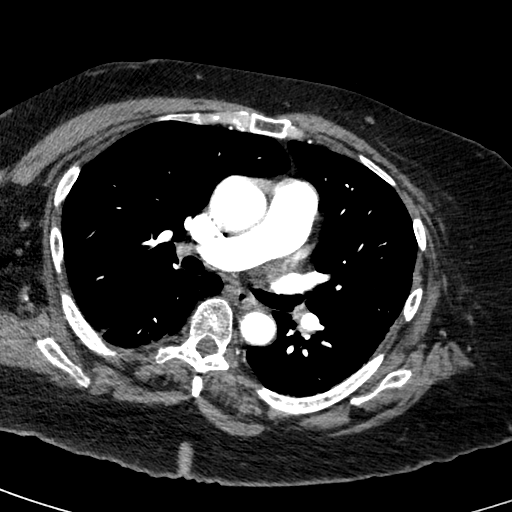
[im 147/241  lung]
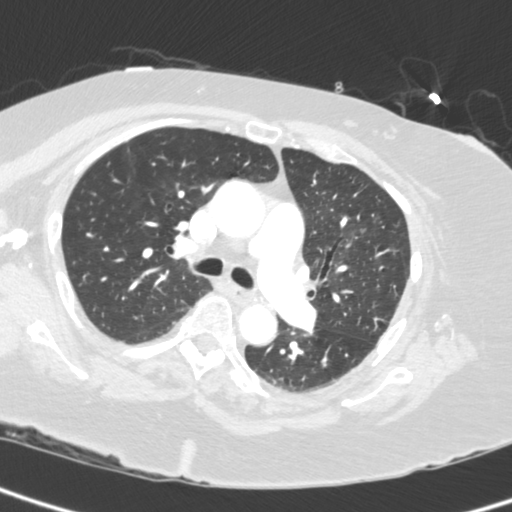
[im 161/241  mediastinal]
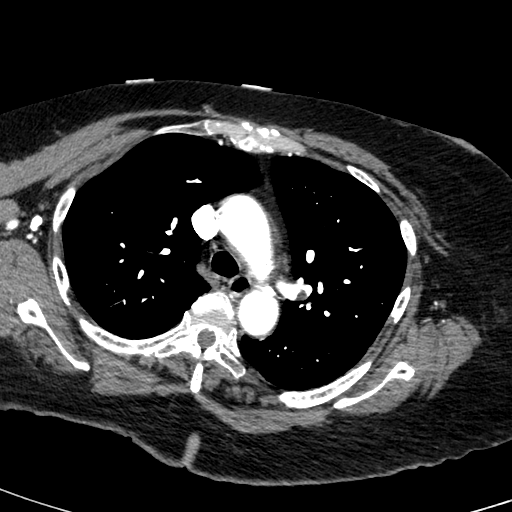
[im 174/241  lung]
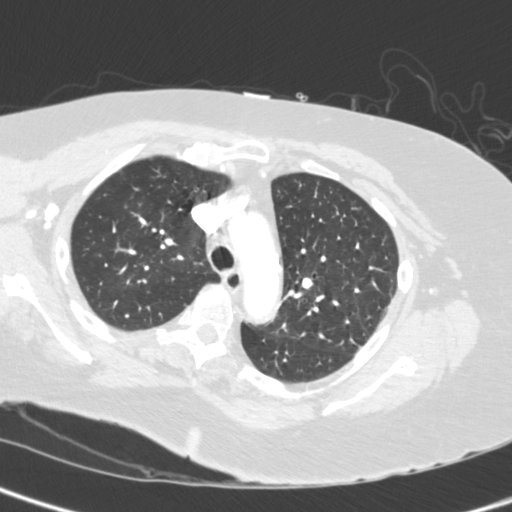
[im 187/241  mediastinal]
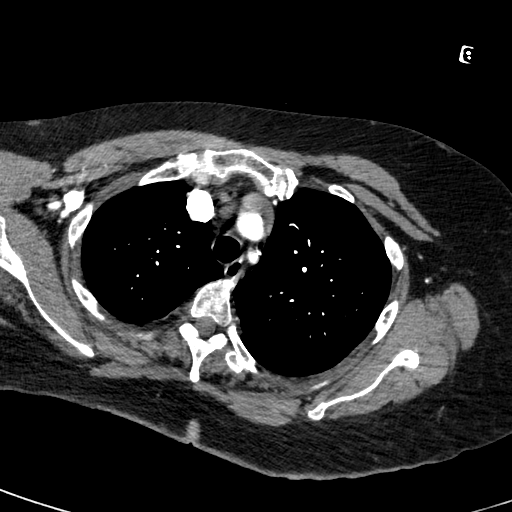
[im 201/241  lung]
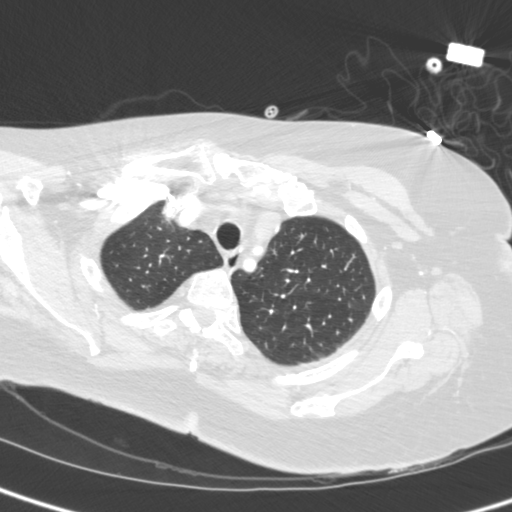
[im 214/241  mediastinal]
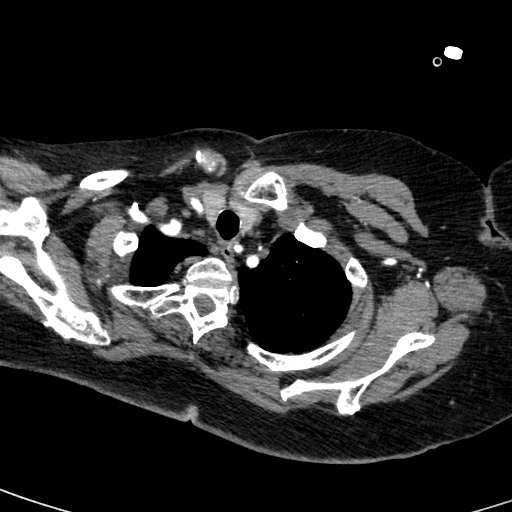
[im 227/241  lung]
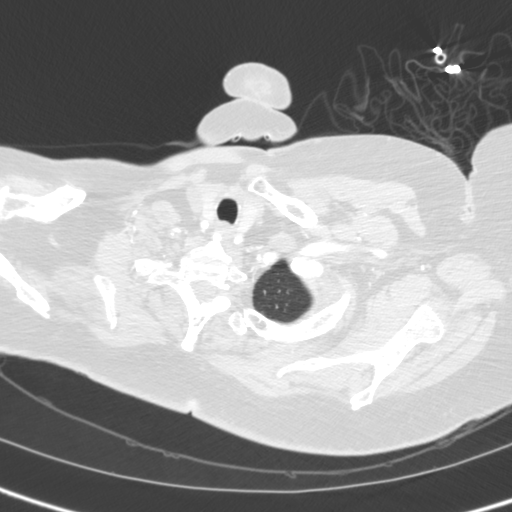

[coronals · coronal · 0.64mm/px · 1 of 96 slices shown]
[im 48/96  mediastinal]
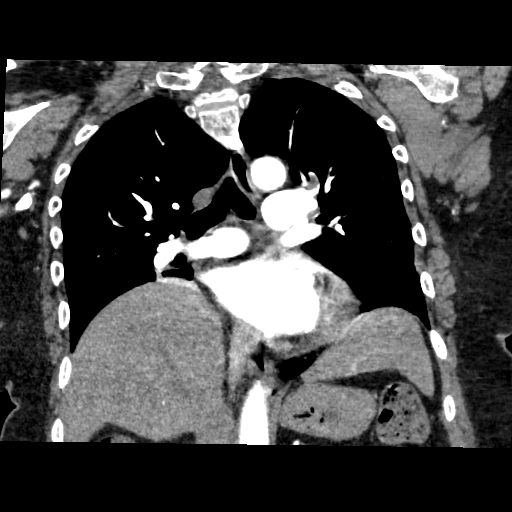

[18 of 36 positions shown; findings below may reference images not displayed]

FINDINGS: Interval significant decrease in the clot burden and both
pulmonary arterial systems since the prior examination.  There is
residual adherent thrombus in the proximal left lower lobe
pulmonary artery and in the proximal left upper lobe pulmonary
artery.  There is moderate residual clot burden in the branches of
the lower lobe pulmonary arteries, though there has been
significant recanalization since the prior study.  Right heart
strain identified on the prior examination has also improved in the
interval, though the right ventricular diameter remains larger than
that of the left ventricle.

Interval development of atelectasis and/or infarction in the medial
right lower lobe.  No new parenchymal opacities elsewhere in either
lung.  Emphysematous changes throughout both lungs. No pleural
effusions.

Mild atherosclerosis involving the thoracic aorta without aneurysm
or dissection.  Overall heart size mildly enlarged but stable.  No
pericardial effusion.  Mild LAD coronary atherosclerosis.

No significant mediastinal, hilar, or axillary lymphadenopathy. New
subcutaneous nodules in the anterior chest wall, the largest
approximating 1.6 cm, new since the prior examination.  Visualized
thyroid gland unremarkable.

Visualized upper abdomen unremarkable for the early arterial phase
of enhancement.  Bone window images demonstrate mid and lower
thoracic spondylosis with large anterior bridging osteophytes at
multiple levels.
IMPRESSION: 1.  Interval significant improvement in the overall clot burden in
both pulmonary arterial systems since the examination on
07/13/2012.  There is residual clot as detailed above, though many
of the branches of the lower lobe pulmonary arteries bilaterally
have shown interval recanalization.
2.  New atelectasis versus infarction in the right lower lobe.  No
acute cardiopulmonary disease otherwise.

3.  COPD/emphysema.
4.  Cardiomegaly.  Improved right heart strain, though the right
ventricular diameter remains slightly larger than the left
ventricular diameter.
5.  New subcutaneous nodules in the chest wall since the prior
examination.  These are of certain etiology.  The largest is
measured above.

## 2014-01-28 ENCOUNTER — Emergency Department (HOSPITAL_COMMUNITY)
Admission: EM | Admit: 2014-01-28 | Discharge: 2014-01-28 | Disposition: A | Discharge status: Home / Self Care | Payer: Medicare Other | Source: Home / Self Care | Attending: Family Medicine | Admitting: Family Medicine

## 2014-01-28 ENCOUNTER — Emergency Department (INDEPENDENT_AMBULATORY_CARE_PROVIDER_SITE_OTHER): Payer: Medicare Other

## 2014-01-28 ENCOUNTER — Encounter (HOSPITAL_COMMUNITY): Payer: Self-pay | Admitting: *Deleted

## 2014-01-28 DIAGNOSIS — R52 Pain, unspecified: Secondary | ICD-10-CM | POA: Diagnosis not present

## 2014-01-28 DIAGNOSIS — M7631 Iliotibial band syndrome, right leg: Secondary | ICD-10-CM | POA: Diagnosis not present

## 2014-01-28 DIAGNOSIS — M25551 Pain in right hip: Secondary | ICD-10-CM | POA: Diagnosis not present

## 2014-01-28 MED ORDER — DICLOFENAC SODIUM 1 % TD GEL: 4.0000 g | Freq: Four times a day (QID) | TRANSDERMAL | Status: DC

## 2014-01-28 NOTE — ED Provider Notes (Signed)
CSN: [id number]     Arrival date & time 01/28/14  0349 History   First MD Initiated Contact with Patient 01/28/14 1007     Chief Complaint  Patient presents with  . Hip Pain   (Consider location/radiation/quality/duration/timing/severity/associated sxs/prior Treatment) Patient is a 74 y.o. female presenting with hip pain.  Hip Pain This is a new problem. The current episode started 2 days ago. The problem has been gradually worsening. Pertinent negatives include no chest pain and no abdominal pain. Associated symptoms comments: Radiates to lat thigh..    Past Medical History  Diagnosis Date  . Thyroid disease   . DVT (deep venous thrombosis)   . Acute massive pulmonary embolism 07/13/2012    Massive PE w/ PEA arrest 07/13/12 >TNK >IVC filter >discharged on comadin    . Cardiogenic shock 07/13/2012  . Hemorrhagic shock 07/15/2012  . Acute renal failure 07/14/2012  . Acute respiratory failure with hypoxia 07/14/2012  . Hyperlipemia 02/25/2013  . Hypertension 06/06/2013  . Diabetes mellitus with neurological manifestations, controlled 06/06/2013   Past Surgical History  Procedure Laterality Date  . Sp arthro hip*l*    . Knee arthroscopy    . Insertion of vena cava filter N/A 07/16/2012    Procedure: INSERTION OF VENA CAVA FILTER;  Surgeon: [name], MD;  Location: [facility] CATH LAB;  Service: Cardiovascular;  Laterality: N/A;   History reviewed. No pertinent family history. History  Substance Use Topics  . Smoking status: Former Smoker -- 1.00 packs/day for 10 years    Types: Cigarettes    Quit date: 01/04/1968  . Smokeless tobacco: Never Used  . Alcohol Use: No   OB History    No data available     Review of Systems  Constitutional: Negative.   Cardiovascular: Negative for chest pain.  Gastrointestinal: Negative.  Negative for abdominal pain.  Musculoskeletal: Positive for back pain. Negative for myalgias, joint swelling and gait problem.  Skin: Negative.     Allergies   Augmentin and Tranxene  Home Medications   Prior to Admission medications   Medication Sig Start Date End Date Taking? Authorizing Provider  cholecalciferol (VITAMIN D) 1000 UNITS tablet Take 1,000 Units by mouth daily.     Historical Provider, MD  diclofenac sodium (VOLTAREN) 1 % GEL Apply 4 g topically 4 (four) times daily. 01/28/14   Linna Hoff, MD  glipiZIDE (GLUCOTROL) 5 MG tablet Take 1 tablet (5 mg total) by mouth daily before breakfast. 06/18/13   [name], DO  glucose blood (ONETOUCH VERIO) test strip Test daily. 02/25/13   [name], DO  HYDROcodone-acetaminophen (NORCO/VICODIN) 5-325 MG per tablet Take 2 tablets by mouth every 4 (four) hours as needed. 01/16/14   Vida Roller, MD  levothyroxine (SYNTHROID) 25 MCG tablet Take 1 tablet (25 mcg total) by mouth daily before breakfast. 12/03/13   [name], DO  metFORMIN (GLUCOPHAGE) 500 MG tablet Take 2 tablets by mouth at breakfast and one at dinner 12/10/13   [name], DO  metoprolol tartrate (LOPRESSOR) 25 MG tablet Take 1 tablet (25 mg total) by mouth 2 (two) times daily. 06/06/13   [name], DO  simvastatin (ZOCOR) 20 MG tablet Take 1 tablet (20 mg total) by mouth at bedtime. 06/06/13   [name], DO  vitamin C (ASCORBIC ACID) 500 MG tablet Take 500 mg by mouth daily.     Historical Provider, MD  VITAMIN E PO Take by mouth daily.  Historical Provider, MD  warfarin (COUMADIN) 5 MG tablet Take as directed by coumadin clinic 08/15/13   Kalman Shan, MD   BP 175/77 mmHg  Pulse 68  Temp(Src) 97.7 F (36.5 C) (Oral)  Resp 16  SpO2 96% Physical Exam  Constitutional: She is oriented to person, place, and time. She appears well-developed and well-nourished.  Abdominal: Soft. Bowel sounds are normal.  Musculoskeletal: She exhibits tenderness.       Right hip: She exhibits tenderness. She exhibits normal strength, no bony tenderness, no swelling and no deformity.  Neurological: She is alert and oriented to  person, place, and time.  Skin: Skin is warm and dry.  Nursing note and vitals reviewed.   ED Course  Procedures (including critical care time) Labs Review Labs Reviewed - No data to display  Imaging Review Dg Hip Unilat With Pelvis 2-3 Views Right  01/28/2014   CLINICAL DATA:  Right hip pain for 2 days  EXAM: DG HIP W/ PELVIS 2-3V*R*  COMPARISON:  None.  FINDINGS: No right hip fracture or dislocation. The right hip joint space is maintained. There is a left hip arthroplasty without failure or complication.  There is lower lumbar spine spondylosis.  There is an IVC filter noted.  IMPRESSION: No right hip fracture or dislocation.   Electronically Signed   By: Elige Ko   On: 01/28/2014 10:44   X-rays reviewed and report per radiologist.   MDM   1. Iliotibial band syndrome affecting lower leg, right   2. Pain        Linna Hoff, MD 01/28/14 [phone]

## 2014-01-28 NOTE — ED Notes (Signed)
Pt    Reports  Pain  R   Hip  Pain      - denys    Any     specefic    Injury   -      Pt      Reports  Pain  Worse  Yesterday         Pt   Is  Awake  Alert  And  Oriented      denys  Any  Chest  Pain  And  Shortness   Of  Breath

## 2014-01-28 NOTE — Discharge Instructions (Signed)
Use heat and medicine as needed and orthopedist if further problems.

## 2014-02-06 ENCOUNTER — Other Ambulatory Visit: Payer: Self-pay | Admitting: Family Medicine

## 2014-02-27 ENCOUNTER — Ambulatory Visit (INDEPENDENT_AMBULATORY_CARE_PROVIDER_SITE_OTHER): Payer: Medicare Other | Admitting: *Deleted

## 2014-02-27 ENCOUNTER — Other Ambulatory Visit: Payer: Self-pay | Admitting: Internal Medicine

## 2014-02-27 DIAGNOSIS — Z7901 Long term (current) use of anticoagulants: Secondary | ICD-10-CM | POA: Diagnosis not present

## 2014-02-27 DIAGNOSIS — I2699 Other pulmonary embolism without acute cor pulmonale: Secondary | ICD-10-CM | POA: Diagnosis not present

## 2014-02-27 LAB — POCT INR: INR: 4.4

## 2014-03-03 ENCOUNTER — Telehealth: Payer: Self-pay | Admitting: *Deleted

## 2014-03-03 NOTE — Telephone Encounter (Signed)
Approval given to change manufacturer of warfarin from [employer] to [employer] as requested from [employer]

## 2014-03-13 ENCOUNTER — Ambulatory Visit (INDEPENDENT_AMBULATORY_CARE_PROVIDER_SITE_OTHER): Payer: Medicare Other | Admitting: *Deleted

## 2014-03-13 DIAGNOSIS — Z7901 Long term (current) use of anticoagulants: Secondary | ICD-10-CM | POA: Diagnosis not present

## 2014-03-13 DIAGNOSIS — I2699 Other pulmonary embolism without acute cor pulmonale: Secondary | ICD-10-CM | POA: Diagnosis not present

## 2014-03-13 LAB — POCT INR: INR: 3.3

## 2014-03-31 ENCOUNTER — Ambulatory Visit (INDEPENDENT_AMBULATORY_CARE_PROVIDER_SITE_OTHER): Payer: Medicare Other | Admitting: Podiatry

## 2014-03-31 DIAGNOSIS — B351 Tinea unguium: Secondary | ICD-10-CM | POA: Diagnosis not present

## 2014-03-31 DIAGNOSIS — M79676 Pain in unspecified toe(s): Secondary | ICD-10-CM

## 2014-03-31 NOTE — Patient Instructions (Addendum)
remove Band-Aids on second and third left toes and 24-48 hours. Apply topical antibiotic ointment to the second and third left toenail daily until a scab forms  Diabetes and Foot Care Diabetes may cause you to have problems because of poor blood supply (circulation) to your feet and legs. This may cause the skin on your feet to become thinner, break easier, and heal more slowly. Your skin may become dry, and the skin may peel and crack. You may also have nerve damage in your legs and feet causing decreased feeling in them. You may not notice minor injuries to your feet that could lead to infections or more serious problems. Taking care of your feet is one of the most important things you can do for yourself.  HOME CARE INSTRUCTIONS  Wear shoes at all times, even in the house. Do not go barefoot. Bare feet are easily injured.  Check your feet daily for blisters, cuts, and redness. If you cannot see the bottom of your feet, use a mirror or ask someone for help.  Wash your feet with warm water (do not use hot water) and mild soap. Then pat your feet and the areas between your toes until they are completely dry. Do not soak your feet as this can dry your skin.  Apply a moisturizing lotion or petroleum jelly (that does not contain alcohol and is unscented) to the skin on your feet and to dry, brittle toenails. Do not apply lotion between your toes.  Trim your toenails straight across. Do not dig under them or around the cuticle. File the edges of your nails with an emery board or nail file.  Do not cut corns or calluses or try to remove them with medicine.  Wear clean socks or stockings every day. Make sure they are not too tight. Do not wear knee-high stockings since they may decrease blood flow to your legs.  Wear shoes that fit properly and have enough cushioning. To break in new shoes, wear them for just a few hours a day. This prevents you from injuring your feet. Always look in your shoes before  you put them on to be sure there are no objects inside.  Do not cross your legs. This may decrease the blood flow to your feet.  If you find a minor scrape, cut, or break in the skin on your feet, keep it and the skin around it clean and dry. These areas may be cleansed with mild soap and water. Do not cleanse the area with peroxide, alcohol, or iodine.  When you remove an adhesive bandage, be sure not to damage the skin around it.  If you have a wound, look at it several times a day to make sure it is healing.  Do not use heating pads or hot water bottles. They may burn your skin. If you have lost feeling in your feet or legs, you may not know it is happening until it is too late.  Make sure your health care provider performs a complete foot exam at least annually or more often if you have foot problems. Report any cuts, sores, or bruises to your health care provider immediately. SEEK MEDICAL CARE IF:   You have an injury that is not healing.  You have cuts or breaks in the skin.  You have an ingrown nail.  You notice redness on your legs or feet.  You feel burning or tingling in your legs or feet.  You have pain or cramps in  your legs and feet.  Your legs or feet are numb.  Your feet always feel cold. SEEK IMMEDIATE MEDICAL CARE IF:   There is increasing redness, swelling, or pain in or around a wound.  There is a red line that goes up your leg.  Pus is coming from a wound.  You develop a fever or as directed by your health care provider.  You notice a bad smell coming from an ulcer or wound. Document Released: 12/18/1999 Document Revised: 08/22/2012 Document Reviewed: 05/29/2012 [facility] Patient Information 2015 [place], [place]. This information is not intended to replace advice given to you by your health care provider. Make sure you discuss any questions you have with your health care provider.

## 2014-04-01 NOTE — Progress Notes (Signed)
Patient ID: Hannah [name], female   DOB: 1940/07/24, 74 y.o.   MRN: [id number]  Subjective: This patient presents today requesting nail debridement. Last visit for this similar service was 07/08/2013  Objective: Orientated 3 The toenails are extremely elongated, hypertrophic, discolored, incurvated and tender to direct palpation 6-10  Assessment: Symptomatic onychomycoses 6-10  Plan: Debridement toenails 10. Pinpoint bleeding distal second and third left toes treated with Betadine and Band-Aids. Patient advised to remove Band-Aids in 24-48 hours and apply topical antibiotic ointment and Band-Aids until scabs form  Reappoint at three-month intervals

## 2014-04-04 ENCOUNTER — Ambulatory Visit (INDEPENDENT_AMBULATORY_CARE_PROVIDER_SITE_OTHER): Payer: Medicare Other | Admitting: Pharmacist

## 2014-04-04 DIAGNOSIS — I2699 Other pulmonary embolism without acute cor pulmonale: Secondary | ICD-10-CM | POA: Diagnosis not present

## 2014-04-04 DIAGNOSIS — Z7901 Long term (current) use of anticoagulants: Secondary | ICD-10-CM | POA: Diagnosis not present

## 2014-04-04 LAB — POCT INR: INR: 4.4

## 2014-04-07 ENCOUNTER — Encounter: Payer: Self-pay | Admitting: Family Medicine

## 2014-04-07 ENCOUNTER — Ambulatory Visit (INDEPENDENT_AMBULATORY_CARE_PROVIDER_SITE_OTHER): Payer: Medicare Other | Admitting: Family Medicine

## 2014-04-07 VITALS — BP 122/82 | HR 84 | Temp 97.4°F | Ht 67.0 in | Wt 208.8 lb

## 2014-04-07 DIAGNOSIS — Z23 Encounter for immunization: Secondary | ICD-10-CM

## 2014-04-07 DIAGNOSIS — I10 Essential (primary) hypertension: Secondary | ICD-10-CM

## 2014-04-07 DIAGNOSIS — E785 Hyperlipidemia, unspecified: Secondary | ICD-10-CM

## 2014-04-07 DIAGNOSIS — E038 Other specified hypothyroidism: Secondary | ICD-10-CM | POA: Diagnosis not present

## 2014-04-07 DIAGNOSIS — E2839 Other primary ovarian failure: Secondary | ICD-10-CM

## 2014-04-07 DIAGNOSIS — E1149 Type 2 diabetes mellitus with other diabetic neurological complication: Secondary | ICD-10-CM

## 2014-04-07 DIAGNOSIS — I2699 Other pulmonary embolism without acute cor pulmonale: Secondary | ICD-10-CM

## 2014-04-07 DIAGNOSIS — Z7901 Long term (current) use of anticoagulants: Secondary | ICD-10-CM

## 2014-04-07 NOTE — Progress Notes (Signed)
Pre visit review using our clinic review tool, if applicable. No additional management support is needed unless otherwise documented below in the visit note. 

## 2014-04-07 NOTE — Progress Notes (Signed)
HPI:  1)Hypothyroid:  -on synthroid chronically  -low dose  -reports:feels well -denies: constipation, hot/cold intol, palpitations, skin or nail changes  2)DM:  -stable  -denies: polyuria, polydipsia, hypoglycemia, foot ulcers, changes in vision  -does senior exercise 2 days per week -last eye exam: out of date in chart -foot exam: needs today -home BS: 1 teens - 130s fasting  -current meds:metformin 500mg  bid, glipizide 5mg  daily  -seeing podiatrist - she was not happy with the podiatrist   3)HX Pulm Embolism with cardiogenic shock and resp failure in 2014  -followed by pulmonologist (Dr. Delton Coombes) and vascular doctor (Dr. Darrick Penna), recent notes reviewed  -denies: SOB, CP, leg swelling  -on chronic coumadin, uses albuterol prn rarely, pulmicort and atrovent on med list  4)Hx cardiogenic shock, HTN, HLD:  -followed by Dr. Tenny Craw in Cardiology  -on coumadin, statin, metoprolol  -denies: CP, SOB, swelling, palpitations, DOE, orthopnea   5)Knee and Hip OA s/p hip and knee replacements: -wants paperwork for SCAT completed -Sees Dr. Luiz Blare at [facility] orthopedics -has severe pain in hips and knees when walks more then a few blocks or stands for long periods of time, has difficulty boarding a bus or walking ups hills -uses walker and cane to ambulate   ROS: See pertinent positives and negatives per HPI.  Past Medical History  Diagnosis Date  . Thyroid disease   . DVT (deep venous thrombosis)   . Acute massive pulmonary embolism 07/13/2012    Massive PE w/ PEA arrest 07/13/12 >TNK >IVC filter >discharged on comadin    . Cardiogenic shock 07/13/2012  . Hemorrhagic shock 07/15/2012  . Acute renal failure 07/14/2012  . Acute respiratory failure with hypoxia 07/14/2012  . Hyperlipemia 02/25/2013  . Hypertension 06/06/2013  . Diabetes mellitus with neurological manifestations, controlled 06/06/2013    Past Surgical History  Procedure Laterality Date  . Sp arthro hip*l*     . Knee arthroscopy    . Insertion of vena cava filter N/A 07/16/2012    Procedure: INSERTION OF VENA CAVA FILTER;  Surgeon: [name], MD;  Location: [facility] CATH LAB;  Service: Cardiovascular;  Laterality: N/A;    No family history on file.  History   Social History  . Marital Status: Widowed    Spouse Name: N/A  . Number of Children: N/A  . Years of Education: N/A   Social History Main Topics  . Smoking status: Former Smoker -- 1.00 packs/day for 10 years    Types: Cigarettes    Quit date: 01/04/1968  . Smokeless tobacco: Never Used  . Alcohol Use: No  . Drug Use: No  . Sexual Activity: Not on file   Other Topics Concern  . None   Social History Narrative   Lives alone.            Current outpatient prescriptions:  .  cholecalciferol (VITAMIN D) 1000 UNITS tablet, Take 1,000 Units by mouth daily. , Disp: , Rfl:  .  glipiZIDE (GLUCOTROL) 5 MG tablet, Take 1 tablet (5 mg total) by mouth daily before breakfast., Disp: 90 tablet, Rfl: 3 .  glucose blood (ONETOUCH VERIO) test strip, Test daily., Disp: 100 each, Rfl: 12 .  levothyroxine (SYNTHROID, LEVOTHROID) 25 MCG tablet, Take 1 tablet by mouth  daily before breakfast, Disp: 90 tablet, Rfl: 0 .  metFORMIN (GLUCOPHAGE) 500 MG tablet, Take 2 tablets by mouth at breakfast and one at dinner, Disp: 90 tablet, Rfl: 1 .  metoprolol tartrate (LOPRESSOR) 25 MG tablet, Take 1 tablet (  25 mg total) by mouth 2 (two) times daily., Disp: 180 tablet, Rfl: 3 .  simvastatin (ZOCOR) 20 MG tablet, Take 1 tablet (20 mg total) by mouth at bedtime., Disp: 90 tablet, Rfl: 3 .  vitamin C (ASCORBIC ACID) 500 MG tablet, Take 500 mg by mouth daily. , Disp: , Rfl:  .  VITAMIN E PO, Take by mouth daily., Disp: , Rfl:  .  warfarin (COUMADIN) 5 MG tablet, TAKE AS DIRECTED BY  COUMADIN  CLINIC, Disp: 90 tablet, Rfl: 1  EXAM:  Filed Vitals:   04/07/14 1340  BP: 122/82  Pulse: 84  Temp: 97.4 F (36.3 C)    Body mass index is 32.69  kg/(m^2).  GENERAL: vitals reviewed and listed above, alert, oriented, appears well hydrated and in no acute distress  HEENT: atraumatic, conjunttiva clear, no obvious abnormalities on inspection of external nose and ears  NECK: no obvious masses on inspection  LUNGS: clear to auscultation bilaterally, no wheezes, rales or rhonchi, good air movement  CV: HRRR, no peripheral edema  MS: moves all extremities without noticeable abnormality  PSYCH: pleasant and cooperative, no obvious depression or anxiety  ASSESSMENT AND PLAN:  Discussed the following assessment and plan:  Diabetes mellitus with neurological manifestations, controlled - Plan: Hemoglobin A1c, Microalbumin/Creatinine Ratio, Urine  Hyperlipemia - Plan: Lipid Panel  Other specified hypothyroidism - Plan: TSH  Essential hypertension - Plan: Basic metabolic panel  Embolism, pulmonary with infarction  Long term current use of anticoagulant therapy  Estrogen deficiency - Plan: DG Bone Density  -lifestyle counseling -unhappy with her podiatrist - offered referral to another - she will call if wants to do this -advised to schedule FASTING labs -follow up in 3 months -Patient advised to return or notify a doctor immediately if symptoms worsen or persist or new concerns arise.  Patient Instructions  BEFORE YOU LEAVE: -Tdap and Prevnar 13 -schedule fasting lab appointment in next 1 week -schedule follow up in 3 months  We recommend the following healthy lifestyle measures: - eat a healthy diet consisting of lots of vegetables, fruits, beans, nuts, seeds, healthy meats such as white chicken and fish and whole grains.  - avoid fried foods, fast food, processed foods, sodas, red meet and other fattening foods.  - get a least 150 minutes of aerobic exercise per week.   SCHEDULE DIABETIC EYE EXAM  -We placed a referral for you as discussed for the bone density test. It usually takes about 1-2 weeks to process and  schedule this referral. If you have not heard from [place] regarding this appointment in 2 weeks please contact our office.      Kriste Basque R.

## 2014-04-07 NOTE — Addendum Note (Signed)
Addended by: Johnella Moloney on: 04/07/2014 02:21 PM   Modules accepted: Orders

## 2014-04-07 NOTE — Patient Instructions (Addendum)
BEFORE YOU LEAVE: -Tdap and Prevnar 13 -schedule fasting lab appointment in next 1 week -schedule follow up in 3 months  We recommend the following healthy lifestyle measures: - eat a healthy diet consisting of lots of vegetables, fruits, beans, nuts, seeds, healthy meats such as white chicken and fish and whole grains.  - avoid fried foods, fast food, processed foods, sodas, red meet and other fattening foods.  - get a least 150 minutes of aerobic exercise per week.   SCHEDULE DIABETIC EYE EXAM  -We placed a referral for you as discussed for the bone density test. It usually takes about 1-2 weeks to process and schedule this referral. If you have not heard from [place] regarding this appointment in 2 weeks please contact our office.

## 2014-04-09 ENCOUNTER — Ambulatory Visit (INDEPENDENT_AMBULATORY_CARE_PROVIDER_SITE_OTHER)
Admission: RE | Admit: 2014-04-09 | Discharge: 2014-04-09 | Disposition: A | Discharge status: Home / Self Care | Payer: Medicare Other | Source: Ambulatory Visit | Attending: Family Medicine | Admitting: Family Medicine

## 2014-04-09 ENCOUNTER — Other Ambulatory Visit (INDEPENDENT_AMBULATORY_CARE_PROVIDER_SITE_OTHER): Payer: Medicare Other

## 2014-04-09 DIAGNOSIS — E1149 Type 2 diabetes mellitus with other diabetic neurological complication: Secondary | ICD-10-CM

## 2014-04-09 DIAGNOSIS — E2839 Other primary ovarian failure: Secondary | ICD-10-CM

## 2014-04-09 DIAGNOSIS — I10 Essential (primary) hypertension: Secondary | ICD-10-CM

## 2014-04-09 DIAGNOSIS — E038 Other specified hypothyroidism: Secondary | ICD-10-CM

## 2014-04-09 DIAGNOSIS — E785 Hyperlipidemia, unspecified: Secondary | ICD-10-CM | POA: Diagnosis not present

## 2014-04-09 LAB — HEMOGLOBIN A1C: Hgb A1c MFr Bld: 7.3 % — ABNORMAL HIGH (ref 4.6–6.5)

## 2014-04-09 LAB — BASIC METABOLIC PANEL
BUN: 11 mg/dL (ref 6–23)
CHLORIDE: 105 meq/L (ref 96–112)
CO2: 28 mEq/L (ref 19–32)
Calcium: 9.7 mg/dL (ref 8.4–10.5)
Creatinine, Ser: 0.89 mg/dL (ref 0.40–1.20)
GFR: 79.98 mL/min (ref >60.00)
Glucose, Bld: 145 mg/dL — ABNORMAL HIGH (ref 70–99)
POTASSIUM: 4.5 meq/L (ref 3.5–5.1)
Sodium: 139 mEq/L (ref 135–145)

## 2014-04-09 LAB — LIPID PANEL
CHOL/HDL RATIO: 3
CHOLESTEROL: 147 mg/dL (ref 0–200)
HDL: 51.4 mg/dL (ref >39.00)
LDL CALC: 73 mg/dL (ref 0–99)
NONHDL: 95.6
Triglycerides: 113 mg/dL (ref 0.0–149.0)
VLDL: 22.6 mg/dL (ref 0.0–40.0)

## 2014-04-09 LAB — MICROALBUMIN / CREATININE URINE RATIO
Creatinine,U: 198.1 mg/dL
Microalb Creat Ratio: 4.8 mg/g (ref 0.0–30.0)
Microalb, Ur: 9.5 mg/dL — ABNORMAL HIGH (ref 0.0–1.9)

## 2014-04-09 LAB — TSH: TSH: 2.63 u[IU]/mL (ref 0.35–4.50)

## 2014-04-12 ENCOUNTER — Other Ambulatory Visit: Payer: Self-pay | Admitting: Family Medicine

## 2014-04-12 DIAGNOSIS — E785 Hyperlipidemia, unspecified: Secondary | ICD-10-CM

## 2014-04-12 DIAGNOSIS — E1149 Type 2 diabetes mellitus with other diabetic neurological complication: Secondary | ICD-10-CM

## 2014-04-12 MED ORDER — GLIPIZIDE 5 MG PO TABS: 5.0000 mg | ORAL_TABLET | Freq: Every day | ORAL | Status: DC

## 2014-04-12 MED ORDER — METOPROLOL TARTRATE 25 MG PO TABS: 25.0000 mg | ORAL_TABLET | Freq: Two times a day (BID) | ORAL | Status: DC

## 2014-04-12 MED ORDER — LEVOTHYROXINE SODIUM 25 MCG PO TABS: ORAL_TABLET | ORAL | Status: DC

## 2014-04-12 MED ORDER — METFORMIN HCL 1000 MG PO TABS: ORAL_TABLET | ORAL | Status: DC

## 2014-04-12 MED ORDER — SIMVASTATIN 20 MG PO TABS: 20.0000 mg | ORAL_TABLET | Freq: Every day | ORAL | Status: DC

## 2014-04-14 ENCOUNTER — Other Ambulatory Visit: Payer: Self-pay | Admitting: *Deleted

## 2014-04-14 DIAGNOSIS — E785 Hyperlipidemia, unspecified: Secondary | ICD-10-CM

## 2014-04-14 DIAGNOSIS — E1149 Type 2 diabetes mellitus with other diabetic neurological complication: Secondary | ICD-10-CM

## 2014-04-14 MED ORDER — METOPROLOL TARTRATE 25 MG PO TABS: 25.0000 mg | ORAL_TABLET | Freq: Two times a day (BID) | ORAL | Status: DC

## 2014-04-14 MED ORDER — METFORMIN HCL 1000 MG PO TABS: ORAL_TABLET | ORAL | Status: DC

## 2014-04-14 MED ORDER — SIMVASTATIN 20 MG PO TABS: 20.0000 mg | ORAL_TABLET | Freq: Every day | ORAL | Status: DC

## 2014-04-14 MED ORDER — GLIPIZIDE 5 MG PO TABS: 5.0000 mg | ORAL_TABLET | Freq: Every day | ORAL | Status: DC

## 2014-04-14 MED ORDER — LEVOTHYROXINE SODIUM 25 MCG PO TABS: ORAL_TABLET | ORAL | Status: DC

## 2014-04-14 NOTE — Telephone Encounter (Signed)
Rx refills sent to [facility] per the pts request.

## 2014-04-25 ENCOUNTER — Ambulatory Visit (INDEPENDENT_AMBULATORY_CARE_PROVIDER_SITE_OTHER): Payer: Medicare Other | Admitting: *Deleted

## 2014-04-25 DIAGNOSIS — Z7901 Long term (current) use of anticoagulants: Secondary | ICD-10-CM | POA: Diagnosis not present

## 2014-04-25 DIAGNOSIS — I2699 Other pulmonary embolism without acute cor pulmonale: Secondary | ICD-10-CM | POA: Diagnosis not present

## 2014-04-25 LAB — POCT INR: INR: 3.6

## 2014-05-09 ENCOUNTER — Ambulatory Visit (INDEPENDENT_AMBULATORY_CARE_PROVIDER_SITE_OTHER): Payer: Medicare Other | Admitting: *Deleted

## 2014-05-09 DIAGNOSIS — I2699 Other pulmonary embolism without acute cor pulmonale: Secondary | ICD-10-CM

## 2014-05-09 DIAGNOSIS — Z7901 Long term (current) use of anticoagulants: Secondary | ICD-10-CM

## 2014-05-09 LAB — POCT INR: INR: 3.3

## 2014-05-23 ENCOUNTER — Emergency Department (HOSPITAL_COMMUNITY): Payer: Medicare Other

## 2014-05-23 ENCOUNTER — Encounter (HOSPITAL_COMMUNITY): Payer: Self-pay | Admitting: Physical Medicine and Rehabilitation

## 2014-05-23 ENCOUNTER — Emergency Department (HOSPITAL_COMMUNITY)
Admission: EM | Admit: 2014-05-23 | Discharge: 2014-05-23 | Disposition: A | Discharge status: Home / Self Care | Payer: Medicare Other | Attending: Emergency Medicine | Admitting: Emergency Medicine

## 2014-05-23 DIAGNOSIS — Z8709 Personal history of other diseases of the respiratory system: Secondary | ICD-10-CM | POA: Diagnosis not present

## 2014-05-23 DIAGNOSIS — K5660 Unspecified intestinal obstruction: Secondary | ICD-10-CM | POA: Diagnosis not present

## 2014-05-23 DIAGNOSIS — R1032 Left lower quadrant pain: Secondary | ICD-10-CM | POA: Diagnosis not present

## 2014-05-23 DIAGNOSIS — Z87448 Personal history of other diseases of urinary system: Secondary | ICD-10-CM | POA: Insufficient documentation

## 2014-05-23 DIAGNOSIS — Z87891 Personal history of nicotine dependence: Secondary | ICD-10-CM | POA: Insufficient documentation

## 2014-05-23 DIAGNOSIS — R1110 Vomiting, unspecified: Secondary | ICD-10-CM | POA: Diagnosis not present

## 2014-05-23 DIAGNOSIS — I10 Essential (primary) hypertension: Secondary | ICD-10-CM | POA: Diagnosis not present

## 2014-05-23 DIAGNOSIS — Z79899 Other long term (current) drug therapy: Secondary | ICD-10-CM | POA: Insufficient documentation

## 2014-05-23 DIAGNOSIS — Z86711 Personal history of pulmonary embolism: Secondary | ICD-10-CM | POA: Insufficient documentation

## 2014-05-23 DIAGNOSIS — K5669 Other intestinal obstruction: Secondary | ICD-10-CM | POA: Insufficient documentation

## 2014-05-23 DIAGNOSIS — E785 Hyperlipidemia, unspecified: Secondary | ICD-10-CM | POA: Insufficient documentation

## 2014-05-23 DIAGNOSIS — E119 Type 2 diabetes mellitus without complications: Secondary | ICD-10-CM | POA: Diagnosis not present

## 2014-05-23 DIAGNOSIS — K6389 Other specified diseases of intestine: Secondary | ICD-10-CM | POA: Diagnosis not present

## 2014-05-23 DIAGNOSIS — E079 Disorder of thyroid, unspecified: Secondary | ICD-10-CM | POA: Diagnosis not present

## 2014-05-23 DIAGNOSIS — Z7901 Long term (current) use of anticoagulants: Secondary | ICD-10-CM | POA: Diagnosis not present

## 2014-05-23 DIAGNOSIS — K56699 Other intestinal obstruction unspecified as to partial versus complete obstruction: Secondary | ICD-10-CM

## 2014-05-23 DIAGNOSIS — Z86718 Personal history of other venous thrombosis and embolism: Secondary | ICD-10-CM | POA: Insufficient documentation

## 2014-05-23 DIAGNOSIS — R109 Unspecified abdominal pain: Secondary | ICD-10-CM

## 2014-05-23 LAB — COMPREHENSIVE METABOLIC PANEL
ALT: 15 U/L (ref 14–54)
AST: 21 U/L (ref 15–41)
Albumin: 3.7 g/dL (ref 3.5–5.0)
Alkaline Phosphatase: 48 U/L (ref 38–126)
Anion gap: 13 (ref 5–15)
BUN: 9 mg/dL (ref 6–20)
CHLORIDE: 104 mmol/L (ref 101–111)
CO2: 20 mmol/L — ABNORMAL LOW (ref 22–32)
CREATININE: 0.86 mg/dL (ref 0.44–1.00)
Calcium: 9.1 mg/dL (ref 8.9–10.3)
GFR calc Af Amer: >60 mL/min (ref >60)
Glucose, Bld: 139 mg/dL — ABNORMAL HIGH (ref 65–99)
POTASSIUM: 3.8 mmol/L (ref 3.5–5.1)
Sodium: 137 mmol/L (ref 135–145)
Total Bilirubin: 0.8 mg/dL (ref 0.3–1.2)
Total Protein: 7.4 g/dL (ref 6.5–8.1)

## 2014-05-23 LAB — I-STAT CG4 LACTIC ACID, ED: Lactic Acid, Venous: 2.58 mmol/L (ref 0.5–2.0)

## 2014-05-23 LAB — URINE MICROSCOPIC-ADD ON

## 2014-05-23 LAB — URINALYSIS, ROUTINE W REFLEX MICROSCOPIC
Bilirubin Urine: NEGATIVE
GLUCOSE, UA: NEGATIVE mg/dL
KETONES UR: NEGATIVE mg/dL
LEUKOCYTES UA: NEGATIVE
Nitrite: NEGATIVE
Protein, ur: 30 mg/dL — AB
Specific Gravity, Urine: 1.01 (ref 1.005–1.030)
Urobilinogen, UA: 0.2 mg/dL (ref 0.0–1.0)
pH: 5.5 (ref 5.0–8.0)

## 2014-05-23 LAB — CBC
HEMATOCRIT: 48.5 % — AB (ref 36.0–46.0)
Hemoglobin: 15.9 g/dL — ABNORMAL HIGH (ref 12.0–15.0)
MCH: 28.5 pg (ref 26.0–34.0)
MCHC: 32.8 g/dL (ref 30.0–36.0)
MCV: 87.1 fL (ref 78.0–100.0)
Platelets: 267 10*3/uL (ref 150–400)
RBC: 5.57 MIL/uL — ABNORMAL HIGH (ref 3.87–5.11)
RDW: 15 % (ref 11.5–15.5)
WBC: 12 10*3/uL — ABNORMAL HIGH (ref 4.0–10.5)

## 2014-05-23 LAB — LIPASE, BLOOD: Lipase: 22 U/L (ref 22–51)

## 2014-05-23 LAB — PROTIME-INR
INR: 3.6 — ABNORMAL HIGH (ref 0.00–1.49)
Prothrombin Time: 35.1 s — ABNORMAL HIGH (ref 11.6–15.2)

## 2014-05-23 MED ORDER — HYDROMORPHONE HCL 1 MG/ML IJ SOLN
1.0000 mg | Freq: Once | INTRAMUSCULAR | Status: AC
  Administered 2014-05-23: 1 mg via INTRAVENOUS
  Filled 2014-05-23: qty 1

## 2014-05-23 MED ORDER — SODIUM CHLORIDE 0.9 % IV BOLUS (SEPSIS)
1000.0000 mL | Freq: Once | INTRAVENOUS | Status: AC
  Administered 2014-05-23: 1000 mL via INTRAVENOUS

## 2014-05-23 MED ORDER — IOHEXOL 350 MG/ML SOLN
80.0000 mL | Freq: Once | INTRAVENOUS | Status: AC | PRN
  Administered 2014-05-23: 80 mL via INTRAVENOUS

## 2014-05-23 NOTE — ED Notes (Signed)
Patient transported to CT 

## 2014-05-23 NOTE — Discharge Instructions (Signed)
Please take Miralax twice a day until you see the GI doctors.  Abdominal Pain Many things can cause abdominal pain. Usually, abdominal pain is not caused by a disease and will improve without treatment. It can often be observed and treated at home. Your health care provider will do a physical exam and possibly order blood tests and X-rays to help determine the seriousness of your pain. However, in many cases, more time must pass before a clear cause of the pain can be found. Before that point, your health care provider may not know if you need more testing or further treatment. HOME CARE INSTRUCTIONS  Monitor your abdominal pain for any changes. The following actions may help to alleviate any discomfort you are experiencing:  Only take over-the-counter or prescription medicines as directed by your health care provider.  Do not take laxatives unless directed to do so by your health care provider.  Try a clear liquid diet (broth, tea, or water) as directed by your health care provider. Slowly move to a bland diet as tolerated. SEEK MEDICAL CARE IF:  You have unexplained abdominal pain.  You have abdominal pain associated with nausea or diarrhea.  You have pain when you urinate or have a bowel movement.  You experience abdominal pain that wakes you in the night.  You have abdominal pain that is worsened or improved by eating food.  You have abdominal pain that is worsened with eating fatty foods.  You have a fever. SEEK IMMEDIATE MEDICAL CARE IF:   Your pain does not go away within 2 hours.  You keep throwing up (vomiting).  Your pain is felt only in portions of the abdomen, such as the right side or the left lower portion of the abdomen.  You pass bloody or black tarry stools. MAKE SURE YOU:  Understand these instructions.   Will watch your condition.   Will get help right away if you are not doing well or get worse.  Document Released: 09/29/2004 Document Revised:  12/25/2012 Document Reviewed: 08/29/2012 [facility] Patient Information 2015 [place], [place]. This information is not intended to replace advice given to you by your health care provider. Make sure you discuss any questions you have with your health care provider.

## 2014-05-23 NOTE — ED Notes (Signed)
Pt up ambulatory to the bathroom at this time with assistance from myself and family member; pt will attempt to provide an urine sample

## 2014-05-23 NOTE — ED Provider Notes (Signed)
CSN: [id number]     Arrival date & time 05/23/14  4098 History   First MD Initiated Contact with Patient 05/23/14 [phone]     Chief Complaint  Patient presents with  . Abdominal Pain     (Consider location/radiation/quality/duration/timing/severity/associated sxs/prior Treatment) Patient is a 74 y.o. female presenting with abdominal pain. The history is provided by the patient.  Abdominal Pain Pain location:  LLQ Pain quality: cramping   Pain radiation: from R side to L side. Pain severity:  Moderate Onset quality:  Gradual Timing:  Constant Progression:  Waxing and waning Chronicity:  New Context: not retching and not sick contacts   Relieved by:  Nothing Worsened by:  Nothing tried Ineffective treatments:  Acetaminophen Associated symptoms: vomiting (once this morning after eating a banana)   Associated symptoms: no chest pain, no cough, no diarrhea, no fever and no shortness of breath     Past Medical History  Diagnosis Date  . Thyroid disease   . DVT (deep venous thrombosis)   . Acute massive pulmonary embolism 07/13/2012    Massive PE w/ PEA arrest 07/13/12 >TNK >IVC filter >discharged on comadin    . Cardiogenic shock 07/13/2012  . Hemorrhagic shock 07/15/2012  . Acute renal failure 07/14/2012  . Acute respiratory failure with hypoxia 07/14/2012  . Hyperlipemia 02/25/2013  . Hypertension 06/06/2013  . Diabetes mellitus with neurological manifestations, controlled 06/06/2013   Past Surgical History  Procedure Laterality Date  . Sp arthro hip*l*    . Knee arthroscopy    . Insertion of vena cava filter N/A 07/16/2012    Procedure: INSERTION OF VENA CAVA FILTER;  Surgeon: [name], MD;  Location: [facility] CATH LAB;  Service: Cardiovascular;  Laterality: N/A;   No family history on file. History  Substance Use Topics  . Smoking status: Former Smoker -- 1.00 packs/day for 10 years    Types: Cigarettes    Quit date: 01/04/1968  . Smokeless tobacco: Never Used  . Alcohol Use:  No   OB History    No data available     Review of Systems  Constitutional: Negative for fever.  Respiratory: Negative for cough and shortness of breath.   Cardiovascular: Negative for chest pain and leg swelling.  Gastrointestinal: Positive for vomiting (once this morning after eating a banana) and abdominal pain. Negative for diarrhea.  All other systems reviewed and are negative.     Allergies  Augmentin and Tranxene  Home Medications   Prior to Admission medications   Medication Sig Start Date End Date Taking? Authorizing Provider  glipiZIDE (GLUCOTROL) 5 MG tablet Take 1 tablet (5 mg total) by mouth daily before breakfast. 04/14/14  Yes [name], DO  levothyroxine (SYNTHROID, LEVOTHROID) 25 MCG tablet Take 1 tablet by mouth  daily before breakfast 04/14/14  Yes [name], DO  metFORMIN (GLUCOPHAGE) 1000 MG tablet  (1 tablet) with breakfast and  (1 tablet) with dinner. 04/14/14  Yes [name], DO  metoprolol tartrate (LOPRESSOR) 25 MG tablet Take 1 tablet (25 mg total) by mouth 2 (two) times daily. 04/14/14  Yes [name], DO  simvastatin (ZOCOR) 20 MG tablet Take 1 tablet (20 mg total) by mouth at bedtime. 04/14/14  Yes [name], DO  vitamin C (ASCORBIC ACID) 500 MG tablet Take 500 mg by mouth daily.    Yes Historical Provider, MD  warfarin (COUMADIN) 5 MG tablet Take 2.5-5 mg by mouth daily. Take 1 tablet all days except on Tues  and Sat take 1/2 tablet   Yes Historical Provider, MD  cholecalciferol (VITAMIN D) 1000 UNITS tablet Take 1,000 Units by mouth daily.     Historical Provider, MD  glucose blood (ONETOUCH VERIO) test strip Test daily. 02/25/13   [name], DO  VITAMIN E PO Take by mouth daily.    Historical Provider, MD  warfarin (COUMADIN) 5 MG tablet TAKE AS DIRECTED BY  COUMADIN  CLINIC 02/28/14   Kalman Shan, MD   BP 186/90 mmHg  Pulse 83  Temp(Src) 98.2 F (36.8 C) (Oral)  Resp 18  SpO2 100% Physical Exam  Constitutional: She  is oriented to person, place, and time. She appears well-developed and well-nourished. No distress.  HENT:  Head: Normocephalic and atraumatic.  Mouth/Throat: Oropharynx is clear and moist.  Eyes: EOM are normal. Pupils are equal, round, and reactive to light.  Neck: Normal range of motion. Neck supple.  Cardiovascular: Normal rate and regular rhythm.  Exam reveals no friction rub.   No murmur heard. Pulmonary/Chest: Effort normal and breath sounds normal. No respiratory distress. She has no wheezes. She has no rales.  Abdominal: Soft. She exhibits no distension. There is tenderness (LLQ, mild). There is no rebound.  Musculoskeletal: Normal range of motion. She exhibits no edema.  Neurological: She is alert and oriented to person, place, and time.  Skin: No rash noted. She is not diaphoretic.  Nursing note and vitals reviewed.   ED Course  Procedures (including critical care time) Labs Review Labs Reviewed  CBC  COMPREHENSIVE METABOLIC PANEL  LIPASE, BLOOD  URINALYSIS, ROUTINE W REFLEX MICROSCOPIC  PROTIME-INR  I-STAT CG4 LACTIC ACID, ED    Imaging Review Ct Cta Abd/pel W/cm &/or W/o Cm  05/23/2014   CLINICAL DATA:  Diffuse abdominal pain.  EXAM: CTA ABDOMEN AND PELVIS WITH CONTRAST  TECHNIQUE: Multidetector CT imaging of the abdomen and pelvis was performed using the standard protocol during bolus administration of intravenous contrast. Multiplanar reconstructed images and MIPs were obtained and reviewed to evaluate the vascular anatomy.  CONTRAST:  80mL OMNIPAQUE IOHEXOL 350 MG/ML SOLN  COMPARISON:  CT scan dated 07/15/2012  FINDINGS: There is minimal calcification in the abdominal aorta and iliac arteries. Superior mesenteric, inferior mesenteric and celiac arteries are widely patent. There are 2 widely patent artery to each each kidney.  The superior mesenteric vein and portal vein are normal.  There is a small amount of ascites anterior to the right lobe of the liver just below the  diaphragm. Liver is otherwise normal. Biliary tree, spleen, pancreas, adrenal glands, and kidneys demonstrate no significant abnormality. There is slight hyperplasia of both adrenal glands, unchanged.  There is diffuse distention of the colon to the proximal sigmoid region. The colon is filled with air and fluid. No dilated small bowel. Terminal ileum and appendix are normal.  There is a transition point at the junction of the descending colon with the sigmoid. There is a small amount of free fluid in the left pericolic gutter adjacent to this transition point and also small amount of free fluid in the pelvis. There are multiple diverticula in the sigmoid colon below the transition point.  The patient has had diverticulitis in this area in the past. This may represent a diverticular stricture although I cannot exclude a mucosal mass at this transition point.  Filter in the IVC.  No acute osseous abnormalities.  Rectum appears normal. Bladder is normal. Uterus appears to have been removed.  Review of the MIP images confirms  the above findings.  IMPRESSION: 1. No significant vascular abnormality of the abdomen. No arterial stenoses. Widely patent SMA and SMV. 2. Diffuse dilatation of the fluid-filled colon to the junction of the descending and sigmoid portions. The possibility of a stricture secondary to prior diverticulitis or mucosal mass should be considered. 3. Small amount of ascites in the abdomen.   Electronically Signed   By: Francene Boyers M.D.   On: 05/23/2014 13:04     EKG Interpretation None      MDM   Final diagnoses:  Colon stricture  Abdominal pain, unspecified abdominal location    74 year old female here with crampy abdominal pain and one episode of vomiting. Began this morning, several hours ago. Denies fevers, diarrhea. History of diverticulitis. Also has history of multiple blood clots including massive bilateral pulmonary emboli several years ago. She is on Coumadin and has an IVC  filter. Here she is well-appearing. She has minimal to no pain on palpation. Her history is concerning for possible mesenteric ischemia. We'll do CT angiogram of her abdomen. With that we'll also evaluate for diverticulitis and other intra-abdominal pathology. Labs show mild white count. CT shows stricture in her colon which could be due to prior diverticulitis versus small mass. I spoke with radiologist who stated no evidence of colitis. Here she states she had a bowel movement this morning and is feeling better. I spoke with Dr. Elnoria Howard with GI who will follow patient neck suite. He recommended MiraLAX twice a day for liquid stool to prevent obstruction. She is not distended and does not appear obstructed at this time. We'll plan for discharge.   Elwin Mocha, MD 05/23/14 [phone]

## 2014-05-23 NOTE — ED Notes (Signed)
Pt presents to department for evaluation of diffuse abdominal pain. Onset Thursday evening. 8/10 pain upon arrival to ED. No nausea/vomiting/diarrhea. Pt is alert and oriented x4.

## 2014-05-25 ENCOUNTER — Emergency Department (HOSPITAL_COMMUNITY)
Admission: EM | Admit: 2014-05-25 | Discharge: 2014-05-25 | Disposition: A | Discharge status: Home / Self Care | Payer: Medicare Other | Attending: Emergency Medicine | Admitting: Emergency Medicine

## 2014-05-25 ENCOUNTER — Encounter (HOSPITAL_COMMUNITY): Payer: Self-pay | Admitting: Emergency Medicine

## 2014-05-25 DIAGNOSIS — E785 Hyperlipidemia, unspecified: Secondary | ICD-10-CM | POA: Insufficient documentation

## 2014-05-25 DIAGNOSIS — R197 Diarrhea, unspecified: Secondary | ICD-10-CM

## 2014-05-25 DIAGNOSIS — E079 Disorder of thyroid, unspecified: Secondary | ICD-10-CM | POA: Insufficient documentation

## 2014-05-25 DIAGNOSIS — T457X5A Adverse effect of anticoagulant antagonists, vitamin K and other coagulants, initial encounter: Secondary | ICD-10-CM | POA: Diagnosis not present

## 2014-05-25 DIAGNOSIS — R112 Nausea with vomiting, unspecified: Secondary | ICD-10-CM | POA: Diagnosis not present

## 2014-05-25 DIAGNOSIS — K5660 Unspecified intestinal obstruction: Secondary | ICD-10-CM | POA: Diagnosis not present

## 2014-05-25 DIAGNOSIS — I10 Essential (primary) hypertension: Secondary | ICD-10-CM | POA: Diagnosis not present

## 2014-05-25 DIAGNOSIS — K56699 Other intestinal obstruction unspecified as to partial versus complete obstruction: Secondary | ICD-10-CM

## 2014-05-25 DIAGNOSIS — Z86711 Personal history of pulmonary embolism: Secondary | ICD-10-CM | POA: Insufficient documentation

## 2014-05-25 DIAGNOSIS — R1031 Right lower quadrant pain: Secondary | ICD-10-CM | POA: Diagnosis present

## 2014-05-25 DIAGNOSIS — Z7901 Long term (current) use of anticoagulants: Secondary | ICD-10-CM | POA: Diagnosis not present

## 2014-05-25 DIAGNOSIS — Z87891 Personal history of nicotine dependence: Secondary | ICD-10-CM | POA: Insufficient documentation

## 2014-05-25 DIAGNOSIS — E1149 Type 2 diabetes mellitus with other diabetic neurological complication: Secondary | ICD-10-CM | POA: Insufficient documentation

## 2014-05-25 DIAGNOSIS — D689 Coagulation defect, unspecified: Secondary | ICD-10-CM | POA: Diagnosis not present

## 2014-05-25 DIAGNOSIS — Z87448 Personal history of other diseases of urinary system: Secondary | ICD-10-CM | POA: Diagnosis not present

## 2014-05-25 DIAGNOSIS — Z86718 Personal history of other venous thrombosis and embolism: Secondary | ICD-10-CM | POA: Insufficient documentation

## 2014-05-25 DIAGNOSIS — T45515A Adverse effect of anticoagulants, initial encounter: Secondary | ICD-10-CM

## 2014-05-25 DIAGNOSIS — K5669 Other intestinal obstruction: Secondary | ICD-10-CM | POA: Insufficient documentation

## 2014-05-25 DIAGNOSIS — D6832 Hemorrhagic disorder due to extrinsic circulating anticoagulants: Secondary | ICD-10-CM

## 2014-05-25 DIAGNOSIS — Z79899 Other long term (current) drug therapy: Secondary | ICD-10-CM | POA: Diagnosis not present

## 2014-05-25 LAB — I-STAT CG4 LACTIC ACID, ED: Lactic Acid, Venous: 1.44 mmol/L (ref 0.5–2.0)

## 2014-05-25 LAB — URINE MICROSCOPIC-ADD ON

## 2014-05-25 LAB — COMPREHENSIVE METABOLIC PANEL
ALT: 12 U/L — ABNORMAL LOW (ref 14–54)
ANION GAP: 15 (ref 5–15)
AST: 18 U/L (ref 15–41)
Albumin: 3.4 g/dL — ABNORMAL LOW (ref 3.5–5.0)
Alkaline Phosphatase: 45 U/L (ref 38–126)
BUN: 21 mg/dL — AB (ref 6–20)
CO2: 18 mmol/L — ABNORMAL LOW (ref 22–32)
Calcium: 8.8 mg/dL — ABNORMAL LOW (ref 8.9–10.3)
Chloride: 100 mmol/L — ABNORMAL LOW (ref 101–111)
Creatinine, Ser: 1.2 mg/dL — ABNORMAL HIGH (ref 0.44–1.00)
GFR calc Af Amer: 51 mL/min — ABNORMAL LOW (ref >60)
GFR calc non Af Amer: 44 mL/min — ABNORMAL LOW (ref >60)
Glucose, Bld: 160 mg/dL — ABNORMAL HIGH (ref 65–99)
Potassium: 3.7 mmol/L (ref 3.5–5.1)
Sodium: 133 mmol/L — ABNORMAL LOW (ref 135–145)
TOTAL PROTEIN: 7.1 g/dL (ref 6.5–8.1)
Total Bilirubin: 0.9 mg/dL (ref 0.3–1.2)

## 2014-05-25 LAB — URINALYSIS, ROUTINE W REFLEX MICROSCOPIC
GLUCOSE, UA: NEGATIVE mg/dL
KETONES UR: 40 mg/dL — AB
NITRITE: NEGATIVE
PH: 5 (ref 5.0–8.0)
PROTEIN: 30 mg/dL — AB
Specific Gravity, Urine: 1.035 — ABNORMAL HIGH (ref 1.005–1.030)
Urobilinogen, UA: 1 mg/dL (ref 0.0–1.0)

## 2014-05-25 LAB — CBC WITH DIFFERENTIAL/PLATELET
Basophils Absolute: 0 10*3/uL (ref 0.0–0.1)
Basophils Relative: 0 % (ref 0–1)
EOS ABS: 0.1 10*3/uL (ref 0.0–0.7)
Eosinophils Relative: 1 % (ref 0–5)
HEMATOCRIT: 46.5 % — AB (ref 36.0–46.0)
HEMOGLOBIN: 15.4 g/dL — AB (ref 12.0–15.0)
LYMPHS ABS: 2.8 10*3/uL (ref 0.7–4.0)
Lymphocytes Relative: 32 % (ref 12–46)
MCH: 28.4 pg (ref 26.0–34.0)
MCHC: 33.1 g/dL (ref 30.0–36.0)
MCV: 85.8 fL (ref 78.0–100.0)
MONOS PCT: 10 % (ref 3–12)
Monocytes Absolute: 0.9 10*3/uL (ref 0.1–1.0)
NEUTROS ABS: 4.8 10*3/uL (ref 1.7–7.7)
Neutrophils Relative %: 56 % (ref 43–77)
Platelets: 264 10*3/uL (ref 150–400)
RBC: 5.42 MIL/uL — ABNORMAL HIGH (ref 3.87–5.11)
RDW: 14.9 % (ref 11.5–15.5)
WBC: 8.5 10*3/uL (ref 4.0–10.5)

## 2014-05-25 LAB — PROTIME-INR
INR: 5.49 — AB (ref 0.00–1.49)
PROTHROMBIN TIME: 48.8 s — AB (ref 11.6–15.2)

## 2014-05-25 MED ORDER — MORPHINE SULFATE 4 MG/ML IJ SOLN: 4.0000 mg | Freq: Once | INTRAMUSCULAR | Status: DC

## 2014-05-25 MED ORDER — SODIUM CHLORIDE 0.9 % IV BOLUS (SEPSIS)
1000.0000 mL | Freq: Once | INTRAVENOUS | Status: AC
  Administered 2014-05-25: 1000 mL via INTRAVENOUS

## 2014-05-25 MED ORDER — ONDANSETRON 4 MG PO TBDP
4.0000 mg | ORAL_TABLET | Freq: Once | ORAL | Status: AC
  Administered 2014-05-25: 4 mg via ORAL
  Filled 2014-05-25: qty 1

## 2014-05-25 MED ORDER — MORPHINE SULFATE 4 MG/ML IJ SOLN
4.0000 mg | Freq: Once | INTRAMUSCULAR | Status: AC
  Administered 2014-05-25: 4 mg via INTRAVENOUS
  Filled 2014-05-25: qty 1

## 2014-05-25 MED ORDER — ONDANSETRON 4 MG PO TBDP: 4.0000 mg | ORAL_TABLET | Freq: Three times a day (TID) | ORAL | Status: DC | PRN

## 2014-05-25 MED ORDER — OXYCODONE-ACETAMINOPHEN 5-325 MG PO TABS: 1.0000 | ORAL_TABLET | Freq: Four times a day (QID) | ORAL | Status: DC | PRN

## 2014-05-25 MED ORDER — HYDROCODONE-ACETAMINOPHEN 5-325 MG PO TABS
1.0000 | ORAL_TABLET | Freq: Once | ORAL | Status: AC
  Administered 2014-05-25: 1 via ORAL
  Filled 2014-05-25: qty 1

## 2014-05-25 NOTE — Discharge Instructions (Signed)
Your CT scan 2 days ago show that you had a stricture in your large intestine which is probably caused from your previous history of diverticulosis/diverticulitis but could also be caused from a mass. You need to follow-up with a gastroenterologist. Your labs today were very reassuring. I feel you're safe to go home and follow up with your primary care provider and gastroenterologist. We are discharging with pain and nausea medicine.   Your Coumadin level was also elevated at 5.49. I recommended she hold your Coumadin for the next 3 days and have your INR rechecked by her primary care provider in a week.    Nausea and Vomiting Nausea is a sick feeling that often comes before throwing up (vomiting). Vomiting is a reflex where stomach contents come out of your mouth. Vomiting can cause severe loss of body fluids (dehydration). Children and elderly adults can become dehydrated quickly, especially if they also have diarrhea. Nausea and vomiting are symptoms of a condition or disease. It is important to find the cause of your symptoms. CAUSES   Direct irritation of the stomach lining. This irritation can result from increased acid production (gastroesophageal reflux disease), infection, food poisoning, taking certain medicines (such as nonsteroidal anti-inflammatory drugs), alcohol use, or tobacco use.  Signals from the brain.These signals could be caused by a headache, heat exposure, an inner ear disturbance, increased pressure in the brain from injury, infection, a tumor, or a concussion, pain, emotional stimulus, or metabolic problems.  An obstruction in the gastrointestinal tract (bowel obstruction).  Illnesses such as diabetes, hepatitis, gallbladder problems, appendicitis, kidney problems, cancer, sepsis, atypical symptoms of a heart attack, or eating disorders.  Medical treatments such as chemotherapy and radiation.  Receiving medicine that makes you sleep (general anesthetic) during  surgery. DIAGNOSIS Your caregiver may ask for tests to be done if the problems do not improve after a few days. Tests may also be done if symptoms are severe or if the reason for the nausea and vomiting is not clear. Tests may include:  Urine tests.  Blood tests.  Stool tests.  Cultures (to look for evidence of infection).  X-rays or other imaging studies. Test results can help your caregiver make decisions about treatment or the need for additional tests. TREATMENT You need to stay well hydrated. Drink frequently but in small amounts.You may wish to drink water, sports drinks, clear broth, or eat frozen ice pops or gelatin dessert to help stay hydrated.When you eat, eating slowly may help prevent nausea.There are also some antinausea medicines that may help prevent nausea. HOME CARE INSTRUCTIONS   Take all medicine as directed by your caregiver.  If you do not have an appetite, do not force yourself to eat. However, you must continue to drink fluids.  If you have an appetite, eat a normal diet unless your caregiver tells you differently.  Eat a variety of complex carbohydrates (rice, wheat, potatoes, bread), lean meats, yogurt, fruits, and vegetables.  Avoid high-fat foods because they are more difficult to digest.  Drink enough water and fluids to keep your urine clear or pale yellow.  If you are dehydrated, ask your caregiver for specific rehydration instructions. Signs of dehydration may include:  Severe thirst.  Dry lips and mouth.  Dizziness.  Dark urine.  Decreasing urine frequency and amount.  Confusion.  Rapid breathing or pulse. SEEK IMMEDIATE MEDICAL CARE IF:   You have blood or brown flecks (like coffee grounds) in your vomit.  You have black or bloody stools.  You have a severe headache or stiff neck.  You are confused.  You have severe abdominal pain.  You have chest pain or trouble breathing.  You do not urinate at least once every 8  hours.  You develop cold or clammy skin.  You continue to vomit for longer than 24 to 48 hours.  You have a fever. MAKE SURE YOU:   Understand these instructions.  Will watch your condition.  Will get help right away if you are not doing well or get worse. Document Released: 12/20/2004 Document Revised: 03/14/2011 Document Reviewed: 05/19/2010 [facility] Patient Information 2015 [place], [place]. This information is not intended to replace advice given to you by your health care provider. Make sure you discuss any questions you have with your health care provider.   Diarrhea Diarrhea is frequent loose and watery bowel movements. It can cause you to feel weak and dehydrated. Dehydration can cause you to become tired and thirsty, have a dry mouth, and have decreased urination that often is dark yellow. Diarrhea is a sign of another problem, most often an infection that will not last long. In most cases, diarrhea typically lasts 2-3 days. However, it can last longer if it is a sign of something more serious. It is important to treat your diarrhea as directed by your caregiver to lessen or prevent future episodes of diarrhea. CAUSES  Some common causes include:  Gastrointestinal infections caused by viruses, bacteria, or parasites.  Food poisoning or food allergies.  Certain medicines, such as antibiotics, chemotherapy, and laxatives.  Artificial sweeteners and fructose.  Digestive disorders. HOME CARE INSTRUCTIONS  Ensure adequate fluid intake (hydration): Have 1 cup (8 oz) of fluid for each diarrhea episode. Avoid fluids that contain simple sugars or sports drinks, fruit juices, whole milk products, and sodas. Your urine should be clear or pale yellow if you are drinking enough fluids. Hydrate with an oral rehydration solution that you can purchase at pharmacies, retail stores, and online. You can prepare an oral rehydration solution at home by mixing the following ingredients  together:   - tsp table salt.   tsp baking soda.   tsp salt substitute containing potassium chloride.  1  tablespoons sugar.  1 L (34 oz) of water.  Certain foods and beverages may increase the speed at which food moves through the gastrointestinal (GI) tract. These foods and beverages should be avoided and include:  Caffeinated and alcoholic beverages.  High-fiber foods, such as raw fruits and vegetables, nuts, seeds, and whole grain breads and cereals.  Foods and beverages sweetened with sugar alcohols, such as xylitol, sorbitol, and mannitol.  Some foods may be well tolerated and may help thicken stool including:  Starchy foods, such as rice, toast, pasta, low-sugar cereal, oatmeal, grits, baked potatoes, crackers, and bagels.  Bananas.  Applesauce.  Add probiotic-rich foods to help increase healthy bacteria in the GI tract, such as yogurt and fermented milk products.  Wash your hands well after each diarrhea episode.  Only take over-the-counter or prescription medicines as directed by your caregiver.  Take a warm bath to relieve any burning or pain from frequent diarrhea episodes. SEEK IMMEDIATE MEDICAL CARE IF:   You are unable to keep fluids down.  You have persistent vomiting.  You have blood in your stool, or your stools are black and tarry.  You do not urinate in 6-8 hours, or there is only a small amount of very dark urine.  You have abdominal pain that increases or localizes.  You have weakness, dizziness, confusion, or light-headedness.  You have a severe headache.  Your diarrhea gets worse or does not get better.  You have a fever or persistent symptoms for more than 2-3 days.  You have a fever and your symptoms suddenly get worse. MAKE SURE YOU:   Understand these instructions.  Will watch your condition.  Will get help right away if you are not doing well or get worse. Document Released: 12/10/2001 Document Revised: 05/06/2013 Document  Reviewed: 08/28/2011 [facility] Patient Information 2015 [place], [place]. This information is not intended to replace advice given to you by your health care provider. Make sure you discuss any questions you have with your health care provider.    Warfarin Coagulopathy Warfarin (Coumadin) coagulopathy refers to bleeding that may occur as a complication of the medicine warfarin. Warfarin is an oral blood thinner (anticoagulant). Warfarin is used for medical conditions where thinning of the blood is needed to prevent blood clots.  CAUSES Bleeding is the most common and most serious complication of warfarin. The amount of bleeding is related to the warfarin dose and length of treatment. In addition, bleeding complications can also occur due to:  Intentional or accidental warfarin overdose.  Underlying medical conditions.  Dietary changes.  Medicine, herbal, supplement, or alcohol interactions. SYMPTOMS Severe bleeding while on warfarin may occur from any tissue or organ. Symptoms of the blood being too thin may include:  Bleeding from the nose or gums.  Blood in bowel movements which may appear as bright red, dark, or black tarry stools.  Blood in the urine which may appear as pink, red, or brown urine.  Unusual bruising or bruising easily.  A cut that does not stop bleeding within 10 minutes.  Vomiting blood or continuous nausea for more than 1 day.  Coughing up blood.  Broken blood vessels in your eye (subconjunctival hemorrhage).  Abdominal or back pain with or without flank bruising.  Sudden, severe headache.  Sudden weakness or numbness of the face, arm, or leg, especially on one side of the body.  Sudden confusion.  Trouble speaking (aphasia) or understanding.  Sudden trouble seeing in one or both eyes.  Sudden trouble walking.  Dizziness.  Loss of balance or coordination.  Vaginal bleeding.  Swelling or pain at an injection site.  Superficial fat tissue  death (necrosis) which may cause skin scarring. This is more common in women and may first present as pain in the waist, thighs, or buttocks.  Fever. HOME CARE INSTRUCTIONS  Always contact your health care provider of any concerns or signs of possible warfarin coagulopathy as soon as possible.  Take warfarin exactly as directed by your health care provider. It is recommended that you take your warfarin dose at the same time of the day. If you have been told to stop taking warfarin, do not resume taking warfarin until directed to do so by your health care provider. Follow your health care provider's instructions if you accidentally take an extra dose or miss a dose of warfarin. It is very important to take warfarin as directed since bleeding or blood clots could result in chronic or permanent injury, pain, or disability.  Keep all follow-up appointments with your health care provider as directed. It is very important to keep your appointments. Not keeping appointments could result in a chronic or permanent injury, pain, or disability because warfarin is a medicine that requires close monitoring.  While taking warfarin, you will need to have regular blood tests to measure  your blood clotting time. These blood tests usually include both the prothrombin time (PT) and International Normalized Ratio (INR) tests. The PT and INR results allow your health care provider to adjust your dose of warfarin. The dose can change for many reasons. It is critically important that you have your PT and INR levels drawn exactly as directed. Your warfarin dose may stay the same or change depending on what the PT and INR results are. Be sure to follow up with your health care provider regarding your PT and INR test results and what your warfarin dosage should be.  Many medicines can interfere with warfarin and affect the PT and INR results. You must tell your health care provider about any and all medicines you take. This  includes all vitamins and supplements. Ask your health care provider before taking these. Prescription and over-the-counter medicine consistency is critical to warfarin management. It is important that potential interactions are checked before you start a new medicine. Be especially cautious with aspirin and anti-inflammatory medicines. Ask your health care provider before taking these. Medicines such as antibiotics and acid-reducing medicine can interact with warfarin and can cause an increased warfarin effect. Warfarin can also interfere with the effectiveness of medicines you are taking. Do not take or discontinue any prescribed or over-the-counter medicine except on the advice of your health care provider or pharmacist.  Some vitamins, supplements, and herbal products interfere with the effectiveness of warfarin. Vitamin E may increase the anticoagulant effects of warfarin. Vitamin K can cause warfarin to be less effective. Do not take or discontinue any vitamin, supplement, or herbal product except on the advice of your health care provider or pharmacist.  Eat what you normally eat and keep the vitamin K content of your diet consistent. Avoid major changes in your diet, or notify your health care provider before changing your diet. Suddenly getting a lot more vitamin K could cause your blood to clot too quickly. A sudden decrease in vitamin K intake could cause your blood to clot too slowly. These changes in vitamin K intake could lead to dangerous blood clotsor to bleeding. To keep your vitamin K intake consistent, you must be aware of which foods contain moderate or high amounts of vitamin K. Some foods high in vitamin K include spinach, kale, broccoli, cabbage, greens, Brussels sprouts, asparagus, bok choy, coleslaw, parsley, and green tea. Arrange a visit with a dietitian to answer your questions.  If you have a loss of appetite or get the stomach flu (viral gastroenteritis), talk to your health care  provider as soon as possible. A decrease in your normal vitamin K intake can make you more sensitive to your usual dose of warfarin.  Some medical conditions may increase your risk for bleeding while you are taking warfarin. A fever, diarrhea lasting more than a day, worsening heart failure, or worsening liver function are some medical conditions that could affect warfarin. Contact your health care provider if you have any of these medical conditions.  Be careful not to cut yourself when using sharp objects or while shaving.  Alcohol can change the body's ability to handle warfarin. It is best to avoid alcoholic drinks or consume only very small amounts while taking warfarin. Notify your health care provider if you change your alcohol intake. A sudden increase in alcohol use can increase your risk of bleeding. Chronic alcohol use can cause warfarin to be less effective.  Limit physical activities or sports that could result in a fall or  cause injury.  Do not use warfarin if you are pregnant.  Inform all your health care providers and your dentist that you take warfarin.  Inform all health care providers if you are taking warfarin and aspirin or platelet inhibitor medicines such as clopidogrel, ticagrelor, or prasugrel. Use of these medicines in addition to warfarin can increase your risk of bleeding or death. Taking these medicines together should only be done under the direct care of your health care providers. SEEK IMMEDIATE MEDICAL CARE IF:  You cough up blood.  You have dark or black stools or there is bright red blood coming from your rectum.  You vomit blood or have nausea for more than 1 day.  You have blood in the urine or pink-colored urine.  You have unusual bruising or have increased bruising.  You have bleeding from the nose or gums that does not stop quickly.  You have a cut that does not stop bleeding within 2-3 minutes.  You have sudden weakness or numbness of the face,  arm, or leg, especially on one side of the body.  You have sudden confusion.  You have trouble speaking (aphasia) or understanding.  You have sudden trouble seeing in one or both eyes.  You have sudden trouble walking.  You have dizziness.  You have a loss of balance or coordination.  You have a sudden, severe headache.  You have a serious fall or head injury, even if you are not bleeding.  You have swelling or pain at an injection site.  You have unexplained tenderness or pain in the abdomen, back, waist, thighs, or buttocks.  You have a fever. Any of these symptoms may represent a serious problem that is an emergency. Do not wait to see if the symptoms will go away. Get medical help right away. Call your local emergency services (911 in [place] Do not drive yourself to the hospital. Document Released: 11/28/2005 Document Revised: 05/06/2013 Document Reviewed: 05/31/2011 [facility] Patient Information 2015 [place], [place]. This information is not intended to replace advice given to you by your health care provider. Make sure you discuss any questions you have with your health care provider.

## 2014-05-25 NOTE — ED Provider Notes (Signed)
  Physical Exam  BP 121/64 mmHg  Pulse 78  Temp(Src) 98.2 F (36.8 C) (Oral)  Resp 15  Ht 5\' 6"  (1.676 m)  Wt 208 lb (94.348 kg)  BMI 33.59 kg/m2  SpO2 96%  Physical Exam  ED Course  Procedures  MDM Care assumed at sign out. Patient came overnight for ab pain. Recent CT showed colon stricture. Hasn't seen Dr. Elnoria Howard from GI yet. Labs showed bicarb 18. Sign out pending PO trial, reassessment. Patient feels better now, tolerated PO fluids. Abdomen nontender now. She will call Dr. Haywood Pao office tomorrow for follow up. INR high, will hold for 3 days. DC prescriptions and instructions written by Dr. Elesa Massed. Will dc home.   Richardean Canal, MD 05/25/14 [phone]

## 2014-05-25 NOTE — ED Provider Notes (Signed)
TIME SEEN: 5:35 AM  CHIEF COMPLAINT: Abdominal pain, vomiting and diarrhea  HPI: Pt is a 74 y.o. female with history of PE and DVT with IVC filter currently on Coumadin, hypertension, hyperlipidemia, diabetes who presents emergency department complaints of diffuse lower abdominal pain for the past several days. Was seen in the emergency department on 5/20 for the same and had a CT scan which showed the colon stricture likely secondary to diverticuli versus mass. Dr. Audley Hose with gastroenterology was consult would and they stated they will follow-up the patient has outpatient. She was discharged home. States she's been trying Tylenol at home without pain relief and is coming back because she cannot control her pain has been vomiting. Denies bloody stool or melena. Denies any worsening of her pain. Denies dysuria, hematuria, vaginal bleeding or discharge. Denies fever. Denies chest pain or shortness of breath.  ROS: See HPI Constitutional: no fever  Eyes: no drainage  ENT: no runny nose   Cardiovascular:  no chest pain  Resp: no SOB  GI: vomiting GU: no dysuria Integumentary: no rash  Allergy: no hives  Musculoskeletal: no leg swelling  Neurological: no slurred speech ROS otherwise negative  PAST MEDICAL HISTORY/PAST SURGICAL HISTORY:  Past Medical History  Diagnosis Date  . Thyroid disease   . DVT (deep venous thrombosis)   . Acute massive pulmonary embolism 07/13/2012    Massive PE w/ PEA arrest 07/13/12 >TNK >IVC filter >discharged on comadin    . Cardiogenic shock 07/13/2012  . Hemorrhagic shock 07/15/2012  . Acute renal failure 07/14/2012  . Acute respiratory failure with hypoxia 07/14/2012  . Hyperlipemia 02/25/2013  . Hypertension 06/06/2013  . Diabetes mellitus with neurological manifestations, controlled 06/06/2013    MEDICATIONS:  Prior to Admission medications   Medication Sig Start Date End Date Taking? Authorizing Provider  cholecalciferol (VITAMIN D) 1000 UNITS tablet Take 1,000  Units by mouth daily.     Historical Provider, MD  glipiZIDE (GLUCOTROL) 5 MG tablet Take 1 tablet (5 mg total) by mouth daily before breakfast. 04/14/14   [name], DO  glucose blood (ONETOUCH VERIO) test strip Test daily. 02/25/13   [name], DO  levothyroxine (SYNTHROID, LEVOTHROID) 25 MCG tablet Take 1 tablet by mouth  daily before breakfast 04/14/14   [name], DO  metFORMIN (GLUCOPHAGE) 1000 MG tablet 1000mg  (1 tablet) with breakfast and 1000mg  (1 tablet) with dinner. 04/14/14   [name], DO  metoprolol tartrate (LOPRESSOR) 25 MG tablet Take 1 tablet (25 mg total) by mouth 2 (two) times daily. 04/14/14   [name], DO  simvastatin (ZOCOR) 20 MG tablet Take 1 tablet (20 mg total) by mouth at bedtime. 04/14/14   [name], DO  vitamin C (ASCORBIC ACID) 500 MG tablet Take 500 mg by mouth daily.     Historical Provider, MD  VITAMIN E PO Take by mouth daily.    Historical Provider, MD  warfarin (COUMADIN) 5 MG tablet TAKE AS DIRECTED BY  COUMADIN  CLINIC 02/28/14   Kalman Shan, MD  warfarin (COUMADIN) 5 MG tablet Take 2.5-5 mg by mouth daily. Take 1 tablet all days except on Tues and Sat take 1/2 tablet    Historical Provider, MD    ALLERGIES:  Allergies  Allergen Reactions  . Augmentin [Amoxicillin-Pot Clavulanate] Other (See Comments)    Severe vaginal itching  . Tranxene [Clorazepate] Itching    SOCIAL HISTORY:  History  Substance Use Topics  . Smoking status: Former Smoker --  1.00 packs/day for 10 years    Types: Cigarettes    Quit date: 01/04/1968  . Smokeless tobacco: Never Used  . Alcohol Use: No    FAMILY HISTORY: No family history on file.  EXAM: BP 132/71 mmHg  Pulse 87  Temp(Src) 98.2 F (36.8 C) (Oral)  Resp 23  Ht  (1.676 m)  Wt 208 lb (94.348 kg)  BMI 33.59 kg/m2  SpO2 95% CONSTITUTIONAL: Alert and oriented and responds appropriately to questions. Well-appearing; well-nourished, pleasant, smiling, appears younger than stated age, in  no distress, nontoxic HEAD: Normocephalic EYES: Conjunctivae clear, PERRL ENT: normal nose; no rhinorrhea; moist mucous membranes; pharynx without lesions noted NECK: Supple, no meningismus, no LAD  CARD: RRR; S1 and S2 appreciated; no murmurs, no clicks, no rubs, no gallops RESP: Normal chest excursion without splinting or tachypnea; breath sounds clear and equal bilaterally; no wheezes, no rhonchi, no rales, no hypoxia or respiratory distress, speaking full sentences ABD/GI: Normal bowel sounds; non-distended; soft, mildly tender palpation throughout the lower abdomen, no rebound, no guarding, no peritoneal signs BACK:  The back appears normal and is non-tender to palpation, there is no CVA tenderness EXT: Normal ROM in all joints; non-tender to palpation; no edema; normal capillary refill; no cyanosis, no calf tenderness or swelling    SKIN: Normal color for age and race; warm NEURO: Moves all extremities equally, sensation to light touch intact diffusely, cranial nerves II through XII intact PSYCH: The patient's mood and manner are appropriate. Grooming and personal hygiene are appropriate.  MEDICAL DECISION MAKING: Patient here with abdominal pain, vomiting and diarrhea. Was seen in the emergency department 2 days ago for the same and had a CT of her abdomen and pelvis which showed a possible colon stricture versus mass. She was planning to follow up with gastroenterology as an outpatient. Was not discharged home with pain medication and states that Tylenol is not sufficient to treat her pain at home. She has had several episodes of nonbloody, nonbilious vomiting and nonbloody diarrhea. Today she is very well-appearing, nontoxic, hemodynamic is stable, afebrile with benign abdominal exam. Will repeat labs to ensure there is no significant change, urinalysis. I do not feel she needs repeat imaging at this time. We'll give Vicodin and Zofran and reassess.  ED PROGRESS: Patient's INR is elevated at  5.49. We'll have her hold her Coumadin for several days. No active bleeding. She has no leukocytosis. Lactate normal.  Her bicarbonate is slightly low at 18 but was 22 days ago. Creatinine is mildly elevated at 1.2. We'll give IV fluids. She still complaining of abdominal pain and reports her nausea is better. We'll by mouth challenge. Will give dose of morphine.   8:00 AM  Pt reports pain is improved after morphine. We'll by mouth challenge patient. She is receiving IV hydration. Have instructed her to hold her Coumadin for the next 3 days. If patient is feeling better and is able to tolerate by mouth, anticipate discharge home. Signed out to oncoming ED provider.  Layla Maw Daevon Holdren, DO 05/25/14 [phone]

## 2014-05-25 NOTE — ED Notes (Signed)
Critical lab INR 5.49 per Jose Persia.

## 2014-05-25 NOTE — ED Notes (Signed)
Patient coming from home with c/o of left lower abdominal pain that has been ongoing since Thursday night with worsening symptoms today.  Pain is 10/10.  Patient has been nauseated, vomiting x 3 and diarrhea.

## 2014-05-25 NOTE — ED Notes (Signed)
Dr. Elesa Massed made aware of patients elevated PT and INR.

## 2014-05-27 ENCOUNTER — Other Ambulatory Visit: Payer: Self-pay | Admitting: Gastroenterology

## 2014-05-27 ENCOUNTER — Ambulatory Visit (HOSPITAL_COMMUNITY)
Admission: RE | Admit: 2014-05-27 | Discharge: 2014-05-27 | Disposition: A | Discharge status: Home / Self Care | Payer: Medicare Other | Source: Ambulatory Visit | Attending: Gastroenterology | Admitting: Gastroenterology

## 2014-05-27 DIAGNOSIS — E669 Obesity, unspecified: Secondary | ICD-10-CM | POA: Diagnosis not present

## 2014-05-27 DIAGNOSIS — K660 Peritoneal adhesions (postprocedural) (postinfection): Secondary | ICD-10-CM | POA: Diagnosis present

## 2014-05-27 DIAGNOSIS — I444 Left anterior fascicular block: Secondary | ICD-10-CM | POA: Diagnosis not present

## 2014-05-27 DIAGNOSIS — E46 Unspecified protein-calorie malnutrition: Secondary | ICD-10-CM | POA: Diagnosis not present

## 2014-05-27 DIAGNOSIS — K5660 Unspecified intestinal obstruction: Secondary | ICD-10-CM | POA: Diagnosis not present

## 2014-05-27 DIAGNOSIS — E860 Dehydration: Secondary | ICD-10-CM | POA: Diagnosis not present

## 2014-05-27 DIAGNOSIS — E785 Hyperlipidemia, unspecified: Secondary | ICD-10-CM | POA: Diagnosis present

## 2014-05-27 DIAGNOSIS — D649 Anemia, unspecified: Secondary | ICD-10-CM | POA: Diagnosis present

## 2014-05-27 DIAGNOSIS — N179 Acute kidney failure, unspecified: Secondary | ICD-10-CM | POA: Diagnosis not present

## 2014-05-27 DIAGNOSIS — K5900 Constipation, unspecified: Secondary | ICD-10-CM | POA: Diagnosis not present

## 2014-05-27 DIAGNOSIS — D72829 Elevated white blood cell count, unspecified: Secondary | ICD-10-CM | POA: Diagnosis present

## 2014-05-27 DIAGNOSIS — R109 Unspecified abdominal pain: Secondary | ICD-10-CM | POA: Diagnosis not present

## 2014-05-27 DIAGNOSIS — E1149 Type 2 diabetes mellitus with other diabetic neurological complication: Secondary | ICD-10-CM | POA: Diagnosis not present

## 2014-05-27 DIAGNOSIS — Z7901 Long term (current) use of anticoagulants: Secondary | ICD-10-CM

## 2014-05-27 DIAGNOSIS — K5792 Diverticulitis of intestine, part unspecified, without perforation or abscess without bleeding: Secondary | ICD-10-CM | POA: Diagnosis not present

## 2014-05-27 DIAGNOSIS — K5669 Other intestinal obstruction: Principal | ICD-10-CM | POA: Diagnosis present

## 2014-05-27 DIAGNOSIS — Z87891 Personal history of nicotine dependence: Secondary | ICD-10-CM

## 2014-05-27 DIAGNOSIS — J9811 Atelectasis: Secondary | ICD-10-CM | POA: Diagnosis not present

## 2014-05-27 DIAGNOSIS — E876 Hypokalemia: Secondary | ICD-10-CM | POA: Diagnosis not present

## 2014-05-27 DIAGNOSIS — R933 Abnormal findings on diagnostic imaging of other parts of digestive tract: Secondary | ICD-10-CM | POA: Diagnosis not present

## 2014-05-27 DIAGNOSIS — K5730 Diverticulosis of large intestine without perforation or abscess without bleeding: Secondary | ICD-10-CM | POA: Diagnosis not present

## 2014-05-27 DIAGNOSIS — E039 Hypothyroidism, unspecified: Secondary | ICD-10-CM | POA: Diagnosis not present

## 2014-05-27 DIAGNOSIS — E871 Hypo-osmolality and hyponatremia: Secondary | ICD-10-CM | POA: Diagnosis not present

## 2014-05-27 DIAGNOSIS — K593 Megacolon, not elsewhere classified: Secondary | ICD-10-CM | POA: Diagnosis not present

## 2014-05-27 DIAGNOSIS — Z96642 Presence of left artificial hip joint: Secondary | ICD-10-CM | POA: Diagnosis not present

## 2014-05-27 DIAGNOSIS — R1110 Vomiting, unspecified: Secondary | ICD-10-CM | POA: Insufficient documentation

## 2014-05-27 DIAGNOSIS — R112 Nausea with vomiting, unspecified: Secondary | ICD-10-CM | POA: Diagnosis not present

## 2014-05-27 DIAGNOSIS — R000 Tachycardia, unspecified: Secondary | ICD-10-CM | POA: Diagnosis not present

## 2014-05-27 DIAGNOSIS — R1084 Generalized abdominal pain: Secondary | ICD-10-CM

## 2014-05-27 DIAGNOSIS — Z86711 Personal history of pulmonary embolism: Secondary | ICD-10-CM | POA: Diagnosis not present

## 2014-05-27 DIAGNOSIS — K6389 Other specified diseases of intestine: Secondary | ICD-10-CM | POA: Insufficient documentation

## 2014-05-27 DIAGNOSIS — Z86718 Personal history of other venous thrombosis and embolism: Secondary | ICD-10-CM

## 2014-05-27 DIAGNOSIS — I10 Essential (primary) hypertension: Secondary | ICD-10-CM | POA: Diagnosis not present

## 2014-05-27 DIAGNOSIS — R1032 Left lower quadrant pain: Secondary | ICD-10-CM | POA: Diagnosis not present

## 2014-05-27 DIAGNOSIS — R140 Abdominal distension (gaseous): Secondary | ICD-10-CM | POA: Diagnosis not present

## 2014-05-29 ENCOUNTER — Inpatient Hospital Stay (HOSPITAL_COMMUNITY)
Admission: EM | Admit: 2014-05-29 | Discharge: 2014-06-16 | DRG: 330 | Disposition: A | Discharge status: Skilled Nursing Facility | Payer: Medicare Other | Attending: Internal Medicine | Admitting: Internal Medicine

## 2014-05-29 ENCOUNTER — Encounter (HOSPITAL_COMMUNITY): Payer: Self-pay | Admitting: Emergency Medicine

## 2014-05-29 DIAGNOSIS — E038 Other specified hypothyroidism: Secondary | ICD-10-CM | POA: Diagnosis not present

## 2014-05-29 DIAGNOSIS — Z96642 Presence of left artificial hip joint: Secondary | ICD-10-CM | POA: Diagnosis present

## 2014-05-29 DIAGNOSIS — K660 Peritoneal adhesions (postprocedural) (postinfection): Secondary | ICD-10-CM | POA: Diagnosis present

## 2014-05-29 DIAGNOSIS — E1149 Type 2 diabetes mellitus with other diabetic neurological complication: Secondary | ICD-10-CM | POA: Diagnosis present

## 2014-05-29 DIAGNOSIS — E669 Obesity, unspecified: Secondary | ICD-10-CM | POA: Diagnosis present

## 2014-05-29 DIAGNOSIS — R112 Nausea with vomiting, unspecified: Secondary | ICD-10-CM | POA: Diagnosis not present

## 2014-05-29 DIAGNOSIS — I10 Essential (primary) hypertension: Secondary | ICD-10-CM | POA: Diagnosis not present

## 2014-05-29 DIAGNOSIS — E039 Hypothyroidism, unspecified: Secondary | ICD-10-CM | POA: Diagnosis not present

## 2014-05-29 DIAGNOSIS — R509 Fever, unspecified: Secondary | ICD-10-CM | POA: Diagnosis not present

## 2014-05-29 DIAGNOSIS — M6281 Muscle weakness (generalized): Secondary | ICD-10-CM | POA: Diagnosis not present

## 2014-05-29 DIAGNOSIS — K5730 Diverticulosis of large intestine without perforation or abscess without bleeding: Secondary | ICD-10-CM | POA: Diagnosis not present

## 2014-05-29 DIAGNOSIS — D72829 Elevated white blood cell count, unspecified: Secondary | ICD-10-CM | POA: Diagnosis present

## 2014-05-29 DIAGNOSIS — D649 Anemia, unspecified: Secondary | ICD-10-CM | POA: Diagnosis not present

## 2014-05-29 DIAGNOSIS — Z86711 Personal history of pulmonary embolism: Secondary | ICD-10-CM | POA: Diagnosis not present

## 2014-05-29 DIAGNOSIS — E785 Hyperlipidemia, unspecified: Secondary | ICD-10-CM | POA: Diagnosis not present

## 2014-05-29 DIAGNOSIS — N179 Acute kidney failure, unspecified: Secondary | ICD-10-CM | POA: Diagnosis not present

## 2014-05-29 DIAGNOSIS — R933 Abnormal findings on diagnostic imaging of other parts of digestive tract: Secondary | ICD-10-CM | POA: Diagnosis not present

## 2014-05-29 DIAGNOSIS — R1032 Left lower quadrant pain: Secondary | ICD-10-CM | POA: Diagnosis not present

## 2014-05-29 DIAGNOSIS — R531 Weakness: Secondary | ICD-10-CM | POA: Diagnosis not present

## 2014-05-29 DIAGNOSIS — I444 Left anterior fascicular block: Secondary | ICD-10-CM | POA: Diagnosis present

## 2014-05-29 DIAGNOSIS — Z9889 Other specified postprocedural states: Secondary | ICD-10-CM

## 2014-05-29 DIAGNOSIS — Z86718 Personal history of other venous thrombosis and embolism: Secondary | ICD-10-CM | POA: Diagnosis not present

## 2014-05-29 DIAGNOSIS — K5792 Diverticulitis of intestine, part unspecified, without perforation or abscess without bleeding: Secondary | ICD-10-CM | POA: Diagnosis present

## 2014-05-29 DIAGNOSIS — K5660 Unspecified intestinal obstruction: Secondary | ICD-10-CM | POA: Diagnosis not present

## 2014-05-29 DIAGNOSIS — R109 Unspecified abdominal pain: Secondary | ICD-10-CM | POA: Diagnosis present

## 2014-05-29 DIAGNOSIS — Z7901 Long term (current) use of anticoagulants: Secondary | ICD-10-CM | POA: Diagnosis not present

## 2014-05-29 DIAGNOSIS — K593 Megacolon, not elsewhere classified: Secondary | ICD-10-CM | POA: Diagnosis not present

## 2014-05-29 DIAGNOSIS — K6389 Other specified diseases of intestine: Secondary | ICD-10-CM | POA: Diagnosis not present

## 2014-05-29 DIAGNOSIS — R000 Tachycardia, unspecified: Secondary | ICD-10-CM | POA: Diagnosis not present

## 2014-05-29 DIAGNOSIS — Z87891 Personal history of nicotine dependence: Secondary | ICD-10-CM | POA: Diagnosis not present

## 2014-05-29 DIAGNOSIS — K5669 Other intestinal obstruction: Secondary | ICD-10-CM | POA: Diagnosis not present

## 2014-05-29 DIAGNOSIS — K5900 Constipation, unspecified: Secondary | ICD-10-CM | POA: Diagnosis present

## 2014-05-29 DIAGNOSIS — K56699 Other intestinal obstruction unspecified as to partial versus complete obstruction: Secondary | ICD-10-CM

## 2014-05-29 DIAGNOSIS — K560 Paralytic ileus: Secondary | ICD-10-CM | POA: Diagnosis not present

## 2014-05-29 DIAGNOSIS — R1031 Right lower quadrant pain: Secondary | ICD-10-CM | POA: Diagnosis not present

## 2014-05-29 DIAGNOSIS — E860 Dehydration: Secondary | ICD-10-CM | POA: Diagnosis not present

## 2014-05-29 DIAGNOSIS — K565 Intestinal adhesions [bands] with obstruction (postprocedural) (postinfection): Secondary | ICD-10-CM | POA: Diagnosis not present

## 2014-05-29 DIAGNOSIS — E46 Unspecified protein-calorie malnutrition: Secondary | ICD-10-CM | POA: Diagnosis not present

## 2014-05-29 DIAGNOSIS — J9811 Atelectasis: Secondary | ICD-10-CM | POA: Diagnosis not present

## 2014-05-29 DIAGNOSIS — I160 Hypertensive urgency: Secondary | ICD-10-CM | POA: Diagnosis present

## 2014-05-29 DIAGNOSIS — E1140 Type 2 diabetes mellitus with diabetic neuropathy, unspecified: Secondary | ICD-10-CM | POA: Diagnosis not present

## 2014-05-29 DIAGNOSIS — E876 Hypokalemia: Secondary | ICD-10-CM | POA: Diagnosis not present

## 2014-05-29 DIAGNOSIS — E871 Hypo-osmolality and hyponatremia: Secondary | ICD-10-CM | POA: Diagnosis present

## 2014-05-29 LAB — CBC WITH DIFFERENTIAL/PLATELET
BASOS PCT: 0 % (ref 0–1)
Basophils Absolute: 0 10*3/uL (ref 0.0–0.1)
EOS ABS: 0 10*3/uL (ref 0.0–0.7)
Eosinophils Relative: 0 % (ref 0–5)
HCT: 49.1 % — ABNORMAL HIGH (ref 36.0–46.0)
Hemoglobin: 16.1 g/dL — ABNORMAL HIGH (ref 12.0–15.0)
LYMPHS ABS: 1.3 10*3/uL (ref 0.7–4.0)
Lymphocytes Relative: 20 % (ref 12–46)
MCH: 28.1 pg (ref 26.0–34.0)
MCHC: 32.8 g/dL (ref 30.0–36.0)
MCV: 85.8 fL (ref 78.0–100.0)
MONO ABS: 0.7 10*3/uL (ref 0.1–1.0)
MONOS PCT: 10 % (ref 3–12)
Neutro Abs: 4.4 10*3/uL (ref 1.7–7.7)
Neutrophils Relative %: 70 % (ref 43–77)
Platelets: 304 10*3/uL (ref 150–400)
RBC: 5.72 MIL/uL — ABNORMAL HIGH (ref 3.87–5.11)
RDW: 14.4 % (ref 11.5–15.5)
WBC: 6.3 10*3/uL (ref 4.0–10.5)

## 2014-05-29 LAB — COMPREHENSIVE METABOLIC PANEL
ALT: 14 U/L (ref 14–54)
AST: 16 U/L (ref 15–41)
Albumin: 3.4 g/dL — ABNORMAL LOW (ref 3.5–5.0)
Alkaline Phosphatase: 51 U/L (ref 38–126)
Anion gap: 18 — ABNORMAL HIGH (ref 5–15)
BILIRUBIN TOTAL: 0.9 mg/dL (ref 0.3–1.2)
BUN: 10 mg/dL (ref 6–20)
CO2: 17 mmol/L — ABNORMAL LOW (ref 22–32)
Calcium: 8.9 mg/dL (ref 8.9–10.3)
Chloride: 102 mmol/L (ref 101–111)
Creatinine, Ser: 1.14 mg/dL — ABNORMAL HIGH (ref 0.44–1.00)
GFR calc Af Amer: 54 mL/min — ABNORMAL LOW (ref >60)
GFR, EST NON AFRICAN AMERICAN: 47 mL/min — AB (ref >60)
GLUCOSE: 289 mg/dL — AB (ref 65–99)
Potassium: 3.7 mmol/L (ref 3.5–5.1)
Sodium: 137 mmol/L (ref 135–145)
Total Protein: 7.2 g/dL (ref 6.5–8.1)

## 2014-05-29 LAB — I-STAT TROPONIN, ED: Troponin i, poc: 0 ng/mL (ref 0.00–0.08)

## 2014-05-29 LAB — PROTIME-INR
INR: 2.85 — ABNORMAL HIGH (ref 0.00–1.49)
Prothrombin Time: 29.4 seconds — ABNORMAL HIGH (ref 11.6–15.2)

## 2014-05-29 LAB — CBG MONITORING, ED: Glucose-Capillary: 194 mg/dL — ABNORMAL HIGH (ref 65–99)

## 2014-05-29 LAB — LIPASE, BLOOD: Lipase: 12 U/L — ABNORMAL LOW (ref 22–51)

## 2014-05-29 LAB — GLUCOSE, CAPILLARY: GLUCOSE-CAPILLARY: 162 mg/dL — AB (ref 65–99)

## 2014-05-29 MED ORDER — ONDANSETRON HCL 4 MG/2ML IJ SOLN
4.0000 mg | Freq: Once | INTRAMUSCULAR | Status: AC
  Administered 2014-05-29: 4 mg via INTRAVENOUS
  Filled 2014-05-29: qty 2

## 2014-05-29 MED ORDER — CETYLPYRIDINIUM CHLORIDE 0.05 % MT LIQD
7.0000 mL | Freq: Two times a day (BID) | OROMUCOSAL | Status: DC
  Administered 2014-05-30 – 2014-06-11 (×18): 7 mL via OROMUCOSAL

## 2014-05-29 MED ORDER — SODIUM CHLORIDE 0.9 % IV SOLN
INTRAVENOUS | Status: AC
  Administered 2014-05-29 – 2014-05-30 (×2): via INTRAVENOUS

## 2014-05-29 MED ORDER — FOLIC ACID 5 MG/ML IJ SOLN
1.0000 mg | Freq: Every day | INTRAMUSCULAR | Status: DC
  Administered 2014-05-29 – 2014-05-30 (×2): 1 mg via INTRAVENOUS
  Filled 2014-05-29 (×2): qty 0.2

## 2014-05-29 MED ORDER — PROMETHAZINE HCL 25 MG/ML IJ SOLN
12.5000 mg | Freq: Four times a day (QID) | INTRAMUSCULAR | Status: DC | PRN
  Administered 2014-06-06: 12.5 mg via INTRAVENOUS
  Filled 2014-05-29 (×2): qty 1

## 2014-05-29 MED ORDER — CHLORHEXIDINE GLUCONATE 0.12 % MT SOLN
15.0000 mL | Freq: Two times a day (BID) | OROMUCOSAL | Status: DC
  Administered 2014-05-29 – 2014-06-15 (×29): 15 mL via OROMUCOSAL
  Filled 2014-05-29 (×38): qty 15

## 2014-05-29 MED ORDER — METOPROLOL TARTRATE 1 MG/ML IV SOLN
5.0000 mg | Freq: Four times a day (QID) | INTRAVENOUS | Status: AC
  Administered 2014-05-29 – 2014-06-12 (×55): 5 mg via INTRAVENOUS
  Filled 2014-05-29 (×61): qty 5

## 2014-05-29 MED ORDER — SODIUM CHLORIDE 0.9 % IV BOLUS (SEPSIS)
1000.0000 mL | Freq: Once | INTRAVENOUS | Status: AC
  Administered 2014-05-29: 1000 mL via INTRAVENOUS

## 2014-05-29 MED ORDER — ONDANSETRON HCL 4 MG/2ML IJ SOLN: 4.0000 mg | Freq: Once | INTRAMUSCULAR | Status: DC

## 2014-05-29 MED ORDER — THIAMINE HCL 100 MG/ML IJ SOLN
100.0000 mg | Freq: Every day | INTRAMUSCULAR | Status: DC
  Administered 2014-05-29 – 2014-05-30 (×2): 100 mg via INTRAVENOUS
  Filled 2014-05-29 (×2): qty 1

## 2014-05-29 MED ORDER — INSULIN ASPART 100 UNIT/ML ~~LOC~~ SOLN
0.0000 [IU] | SUBCUTANEOUS | Status: DC
  Administered 2014-05-29 – 2014-05-30 (×3): 2 [IU] via SUBCUTANEOUS
  Administered 2014-05-30: 1 [IU] via SUBCUTANEOUS
  Filled 2014-05-29: qty 1

## 2014-05-29 MED ORDER — ONDANSETRON HCL 4 MG/2ML IJ SOLN
4.0000 mg | INTRAMUSCULAR | Status: DC | PRN
  Administered 2014-05-31 – 2014-06-12 (×10): 4 mg via INTRAVENOUS
  Filled 2014-05-29 (×10): qty 2

## 2014-05-29 MED ORDER — LEVOTHYROXINE SODIUM 100 MCG IV SOLR
12.5000 ug | Freq: Every day | INTRAVENOUS | Status: DC
  Administered 2014-05-30 – 2014-06-10 (×11): 12.5 ug via INTRAVENOUS
  Filled 2014-05-29 (×13): qty 5

## 2014-05-29 NOTE — ED Notes (Signed)
Report attempted 

## 2014-05-29 NOTE — H&P (Signed)
[facility] History and Physical                                                                                    Hannah [name], is a 74 y.o. female  MRN: [id number]   DOB - 1940/03/09  Admit Date - 05/29/2014  Outpatient Primary MD for the patient is Hannah [name]., DO  Referring MD: [name] / ER  Consulting MD: [name] / Gastroenterology  With History of -  Past Medical History  Diagnosis Date  . Thyroid disease   . DVT (deep venous thrombosis)   . Acute massive pulmonary embolism 07/13/2012    Massive PE w/ PEA arrest 07/13/12 >TNK >IVC filter >discharged on comadin    . Cardiogenic shock 07/13/2012  . Hemorrhagic shock 07/15/2012  . Acute renal failure 07/14/2012  . Acute respiratory failure with hypoxia 07/14/2012  . Hyperlipemia 02/25/2013  . Hypertension 06/06/2013  . Diabetes mellitus with neurological manifestations, controlled 06/06/2013      Past Surgical History  Procedure Laterality Date  . Sp arthro hip*l*    . Knee arthroscopy    . Insertion of vena cava filter N/A 07/16/2012    Procedure: INSERTION OF VENA CAVA FILTER;  Surgeon: [name], MD;  Location: [facility] CATH LAB;  Service: Cardiovascular;  Laterality: N/A;    in for   Chief Complaint  Patient presents with  . Tachycardia  . Abdominal Pain     HPI This is a 74 yo female w/ history DM, diverticulosis, DVT/PE on chronic Coumadin, HTN, and dyslipidemia. Presented w/ intractable N/V in setting of recent dx colonic stricture. Pt has presented to ER on multiple occasions this week for abdominal pain that has progressively worsened and has become assoc with N/V. Sx's began one week ago; CT abdomen 5/20 revealed stricture at desc and sigmoid colon. Plan was for pt to check w/ PCP re OK to hold Coumadin for colonoscopy then proceed as OP. On 5/21 INR was 5.49 so she was instructed to hold her Coumadin. Unfortunately her sx's worsened and she came to ER.  In ER; 2V abdomen showed mild gas distention in  colon, INR was down to 2.85. She was mildly hemoconcentrated (14,6 to 16.1 today), CO2 17, BUN 10 and Cr 1.14, WBC 6,300. Dr. [name] was consulted by the EDP and plans are to proceed with colonscoopy after INR < 2.0   Review of Systems   In addition to the HPI above,  No Fever-chills, myalgias or other constitutional symptoms No Headache, changes with Vision or hearing, new weakness, tingling, numbness in any extremity, No problems swallowing food or Liquids, indigestion/reflux No Chest pain, Cough or Shortness of Breath, palpitations, orthopnea or DOE No dysuria, hematuria or flank pain No new skin rashes, lesions, masses or bruises, No new joints pains-aches No recent weight gain or loss No polyuria, polydypsia or polyphagia,  *A full 10 point Review of Systems was done, except as stated above, all other Review of Systems were negative.  Social History History  Substance Use Topics  . Smoking status: Former Smoker -- 1.00 packs/day for 10 years    Types: Cigarettes    Quit date:  01/04/1968  . Smokeless tobacco: Never Used  . Alcohol Use: No    Resides at: Private residence  Lives with: Alone  Ambulatory status: Without assistive devices prior to admission   Family History Mother: Breast cancer Cousin: New diagnosis of unknown type cancer that presented with gastrointestinal symptoms suspect gastric cancer   Prior to Admission medications   Medication Sig Start Date End Date Taking? Authorizing Provider  glipiZIDE (GLUCOTROL) 5 MG tablet Take 1 tablet (5 mg total) by mouth daily before breakfast. 04/14/14  Yes Hannah Koyanagi, DO  levothyroxine (SYNTHROID, LEVOTHROID) 25 MCG tablet Take 1 tablet by mouth  daily before breakfast 04/14/14  Yes Hannah Koyanagi, DO  metFORMIN (GLUCOPHAGE) 1000 MG tablet 1000mg  (1 tablet) with breakfast and 1000mg  (1 tablet) with dinner. Patient taking differently: Take 1,000 mg by mouth 2 (two) times daily with a meal. 1000mg  (1 tablet) with breakfast  and 1000mg  (1 tablet) with dinner. 04/14/14  Yes Hannah Koyanagi, DO  metoprolol tartrate (LOPRESSOR) 25 MG tablet Take 1 tablet (25 mg total) by mouth 2 (two) times daily. 04/14/14  Yes Hannah Koyanagi, DO  ondansetron (ZOFRAN ODT) 4 MG disintegrating tablet Take 1 tablet (4 mg total) by mouth every 8 (eight) hours as needed for nausea or vomiting. 05/25/14  Yes Kristen N Ward, DO  oxyCODONE-acetaminophen (PERCOCET/ROXICET) 5-325 MG per tablet Take 1 tablet by mouth every 6 (six) hours as needed. Patient taking differently: Take 1 tablet by mouth every 6 (six) hours as needed for moderate pain.  05/25/14  Yes Kristen N Ward, DO  simvastatin (ZOCOR) 20 MG tablet Take 1 tablet (20 mg total) by mouth at bedtime. 04/14/14  Yes Hannah Koyanagi, DO  vitamin C (ASCORBIC ACID) 500 MG tablet Take 500 mg by mouth daily.    Yes Historical Provider, MD  warfarin (COUMADIN) 5 MG tablet Take 2.5-5 mg by mouth See admin instructions. Take 1 tablet all days except on Tues and Sat take 1/2 tablet   Yes Historical Provider, MD  glucose blood (ONETOUCH VERIO) test strip Test daily. 02/25/13   Hannah Koyanagi, DO    Allergies  Allergen Reactions  . Augmentin [Amoxicillin-Pot Clavulanate] Other (See Comments)    Severe vaginal itching  . Tranxene [Clorazepate] Itching    Physical Exam  Vitals  Blood pressure 154/96, pulse 103, temperature 98.2 F (36.8 C), temperature source Oral, resp. rate 21, SpO2 99 %.   General:  In no acute distress, appears healthy and well nourished  Psych:  Normal affect, Denies Suicidal or Homicidal ideations, Awake Alert, Oriented X 3. Speech and thought patterns are clear and appropriate, no apparent short term memory deficits  Neuro:   No focal neurological deficits, CN II through XII intact, Strength 5/5 all 4 extremities, Sensation intact all 4 extremities.  ENT:  Ears and Eyes appear Normal, Conjunctivae clear, PER. Moist oral mucosa without erythema or exudates.  Neck:  Supple, No  lymphadenopathy appreciated  Respiratory:  Symmetrical chest wall movement, Good air movement bilaterally, CTAB. Room Air  Cardiac:  RRR, No Murmurs, no LE edema noted, no JVD, No carotid bruits, peripheral pulses palpable at 2+  Abdomen:  Positive bowel sounds, Soft, Non tender, Non distended,  No masses appreciated, no obvious hepatosplenomegaly  Skin:  No Cyanosis, Normal Skin Turgor, No Skin Rash or Bruise.  Extremities: Symmetrical without obvious trauma or injury,  no effusions.  Data Review  CBC  Recent Labs Lab 05/23/14 0850 05/25/14 0550 05/29/14 1310  WBC 12.0* 8.5 6.3  HGB 15.9* 15.4* 16.1*  HCT 48.5* 46.5* 49.1*  PLT 267 264 304  MCV 87.1 85.8 85.8  MCH 28.5 28.4 28.1  MCHC 32.8 33.1 32.8  RDW 15.0 14.9 14.4  LYMPHSABS  --  2.8 1.3  MONOABS  --  0.9 0.7  EOSABS  --  0.1 0.0  BASOSABS  --  0.0 0.0    Chemistries   Recent Labs Lab 05/23/14 0850 05/25/14 0550 05/29/14 1310  NA 137 133* 137  K 3.8 3.7 3.7  CL 104 100* 102  CO2 20* 18* 17*  GLUCOSE 139* 160* 289*  BUN 9 21* 10  CREATININE 0.86 1.20* 1.14*  CALCIUM 9.1 8.8* 8.9  AST 21 18 16   ALT 15 12* 14  ALKPHOS 48 45 51  BILITOT 0.8 0.9 0.9    estimated creatinine clearance is 50.9 mL/min (by C-G formula based on Cr of 1.14).  No results for input(s): TSH, T4TOTAL, T3FREE, THYROIDAB in the last 72 hours.  Invalid input(s): FREET3  Coagulation profile  Recent Labs Lab 05/23/14 0850 05/25/14 0550 05/29/14 1311  INR 3.60* 5.49* 2.85*    No results for input(s): DDIMER in the last 72 hours.  Cardiac Enzymes No results for input(s): CKMB, TROPONINI, MYOGLOBIN in the last 168 hours.  Invalid input(s): CK  Invalid input(s): POCBNP  Urinalysis    Component Value Date/Time   COLORURINE AMBER* 05/25/2014 0634   APPEARANCEUR CLOUDY* 05/25/2014 0634   LABSPEC 1.035* 05/25/2014 0634   PHURINE 5.0 05/25/2014 0634   GLUCOSEU NEGATIVE 05/25/2014 0634   HGBUR TRACE* 05/25/2014 0634    BILIRUBINUR MODERATE* 05/25/2014 0634   KETONESUR 40* 05/25/2014 0634   PROTEINUR 30* 05/25/2014 0634   UROBILINOGEN 1.0 05/25/2014 0634   NITRITE NEGATIVE 05/25/2014 0634   LEUKOCYTESUR SMALL* 05/25/2014 0634    Imaging results:   Dg Abd 2 Views  05/27/2014   CLINICAL DATA:  Generalized abdominal pain and vomiting for 5 days.  EXAM: ABDOMEN - 2 VIEW  COMPARISON:  CT 05/23/2014  FINDINGS: There is gaseous distention of the colon. No small bowel dilatation to suggest small bowel obstruction. No free air, organomegaly or suspicious calcification. IVC filter in place. Prior left hip replacement.  IMPRESSION: Mild gaseous distention of the colon, possibly mild ileus.   Electronically Signed   By: Charlett Nose M.D.   On: 05/27/2014 16:07   Ct Cta Abd/pel W/cm &/or W/o Cm  05/23/2014   CLINICAL DATA:  Diffuse abdominal pain.  EXAM: CTA ABDOMEN AND PELVIS WITH CONTRAST  TECHNIQUE: Multidetector CT imaging of the abdomen and pelvis was performed using the standard protocol during bolus administration of intravenous contrast. Multiplanar reconstructed images and MIPs were obtained and reviewed to evaluate the vascular anatomy.  CONTRAST:  80mL OMNIPAQUE IOHEXOL 350 MG/ML SOLN  COMPARISON:  CT scan dated 07/15/2012  FINDINGS: There is minimal calcification in the abdominal aorta and iliac arteries. Superior mesenteric, inferior mesenteric and celiac arteries are widely patent. There are 2 widely patent artery to each each kidney.  The superior mesenteric vein and portal vein are normal.  There is a small amount of ascites anterior to the right lobe of the liver just below the diaphragm. Liver is otherwise normal. Biliary tree, spleen, pancreas, adrenal glands, and kidneys demonstrate no significant abnormality. There is slight hyperplasia of both adrenal glands, unchanged.  There is diffuse distention of the colon to the proximal sigmoid region. The colon is filled with air and fluid. No dilated small  bowel.  Terminal ileum and appendix are normal.  There is a transition point at the junction of the descending colon with the sigmoid. There is a small amount of free fluid in the left pericolic gutter adjacent to this transition point and also small amount of free fluid in the pelvis. There are multiple diverticula in the sigmoid colon below the transition point.  The patient has had diverticulitis in this area in the past. This may represent a diverticular stricture although I cannot exclude a mucosal mass at this transition point.  Filter in the IVC.  No acute osseous abnormalities.  Rectum appears normal. Bladder is normal. Uterus appears to have been removed.  Review of the MIP images confirms the above findings.  IMPRESSION: 1. No significant vascular abnormality of the abdomen. No arterial stenoses. Widely patent SMA and SMV. 2. Diffuse dilatation of the fluid-filled colon to the junction of the descending and sigmoid portions. The possibility of a stricture secondary to prior diverticulitis or mucosal mass should be considered. 3. Small amount of ascites in the abdomen.   Electronically Signed   By: Francene Boyers M.D.   On: 05/23/2014 13:04     EKG: (Independently reviewed) sinus tachycardia, LAFB, + voltage criteria for LVH   Assessment & Plan  Principal Problem:   Colonic stricture -admit to medical floor -GI consulted; colonoscopy once INR < 2.0 -NPO except for very few ice chips -IVF at 75/hr -supportive care w/antiemetics  Active Problems:   Dehydration/Acute renal failure -IVFs  Follow labs    Hypothyroid -hold Synthroid PO while NPO-convert to IV dosing    Hypertension -controlled -hold PO meds for now -on BB so provide scheduled IV dosing    Diabetes mellitus with neurological manifestations, controlled -hold Glucotrol -SSI    Long term current use of anticoagulant therapy/History of pulmonary embolism -hold Coumadin in anticipation of IP colonoscopy -begin Heparin  infusion once INR </= 2.0   DVT Prophylaxis: Warfarin  Family Communication:   Daughters at bedside  Code Status:  Full code  Condition:  Stable  Discharge disposition: Return to home after nausea and vomiting resolved and gastrointestinal evaluation completed  Time spent in minutes : 60      ELLIS,ALLISON L. ANP on 05/29/2014 at 4:18 PM  Between 7am to 7pm - Pager - [phone]  After 7pm go to [email or web address] - password [email or web address]  And look for the night coverage person covering me after hours  [facility]

## 2014-05-29 NOTE — ED Provider Notes (Signed)
CSN: [id number]     Arrival date & time 05/29/14  1231 History   First MD Initiated Contact with Patient 05/29/14 1334     Chief Complaint  Patient presents with  . Tachycardia  . Abdominal Pain     (Consider location/radiation/quality/duration/timing/severity/associated sxs/prior Treatment) Patient is a 74 y.o. female presenting with abdominal pain. The history is provided by the patient and medical records.  Abdominal Pain Associated symptoms: nausea and vomiting     This is a 74 year old female with past medical history significant for thyroid disease, hyperlipidemia, hypertension, diabetes, history of DVT and PE currently on Coumadin, presenting to the ED for abdominal pain, nausea, and vomiting. This is her third ED visit within the past week for similar symptoms. She states she had a CT scan performed which revealed colonic strictures and was sent to GI for follow-up. States she was seen ago 2 days ago with plain films, no signs of SBO at that time either.  She saw Dr. [name] on Tuesday who recommended colonoscopy, miralax as needed to help with bowel movements.  States whenever she takes the miralax it makes her vomit but she has not had a bowel movement in approx 1 week.  She has been eating broth for the past 5 days, attempted to eat some jello last night with return of vomiting. Daughter called Dr. [name] last night and was told if symptoms persisted she may need admission.  She denies any chest pain or SOB.  Denies bloating sensation, is able to pass flatus.  No prior abdominal surgeries.  No hx of bowel obstructions in the past.  No fever, chills, sweats, or urinary symptoms.  VSS.  Past Medical History  Diagnosis Date  . Thyroid disease   . DVT (deep venous thrombosis)   . Acute massive pulmonary embolism 07/13/2012    Massive PE w/ PEA arrest 07/13/12 >TNK >IVC filter >discharged on comadin    . Cardiogenic shock 07/13/2012  . Hemorrhagic shock 07/15/2012  . Acute renal failure  07/14/2012  . Acute respiratory failure with hypoxia 07/14/2012  . Hyperlipemia 02/25/2013  . Hypertension 06/06/2013  . Diabetes mellitus with neurological manifestations, controlled 06/06/2013   Past Surgical History  Procedure Laterality Date  . Sp arthro hip*l*    . Knee arthroscopy    . Insertion of vena cava filter N/A 07/16/2012    Procedure: INSERTION OF VENA CAVA FILTER;  Surgeon: [name], MD;  Location: [facility] CATH LAB;  Service: Cardiovascular;  Laterality: N/A;   No family history on file. History  Substance Use Topics  . Smoking status: Former Smoker -- 1.00 packs/day for 10 years    Types: Cigarettes    Quit date: 01/04/1968  . Smokeless tobacco: Never Used  . Alcohol Use: No   OB History    No data available     Review of Systems  Gastrointestinal: Positive for nausea, vomiting and abdominal pain.  All other systems reviewed and are negative.     Allergies  Augmentin and Tranxene  Home Medications   Prior to Admission medications   Medication Sig Start Date End Date Taking? Authorizing Provider  glipiZIDE (GLUCOTROL) 5 MG tablet Take 1 tablet (5 mg total) by mouth daily before breakfast. 04/14/14   [name], DO  glucose blood (ONETOUCH VERIO) test strip Test daily. 02/25/13   [name], DO  levothyroxine (SYNTHROID, LEVOTHROID) 25 MCG tablet Take 1 tablet by mouth  daily before breakfast 04/14/14   [name], DO  metFORMIN (GLUCOPHAGE) 1000 MG tablet 1000mg  (1 tablet) with breakfast and 1000mg  (1 tablet) with dinner. Patient taking differently: Take 1,000 mg by mouth 2 (two) times daily with a meal. 1000mg  (1 tablet) with breakfast and 1000mg  (1 tablet) with dinner. 04/14/14   [name], DO  metoprolol tartrate (LOPRESSOR) 25 MG tablet Take 1 tablet (25 mg total) by mouth 2 (two) times daily. 04/14/14   [name], DO  ondansetron (ZOFRAN ODT) 4 MG disintegrating tablet Take 1 tablet (4 mg total) by mouth every 8 (eight) hours as needed for nausea  or vomiting. 05/25/14   Layla Maw Ward, DO  oxyCODONE-acetaminophen (PERCOCET/ROXICET) 5-325 MG per tablet Take 1 tablet by mouth every 6 (six) hours as needed. 05/25/14   Kristen N Ward, DO  simvastatin (ZOCOR) 20 MG tablet Take 1 tablet (20 mg total) by mouth at bedtime. 04/14/14   [name], DO  vitamin C (ASCORBIC ACID) 500 MG tablet Take 500 mg by mouth daily.     Historical Provider, MD  warfarin (COUMADIN) 5 MG tablet Take 2.5-5 mg by mouth See admin instructions. Take 1 tablet all days except on Tues and Sat take 1/2 tablet    Historical Provider, MD   BP 158/88 mmHg  Pulse 130  Temp(Src) 98.2 F (36.8 C) (Oral)  Resp 16  SpO2 98%   Physical Exam  Constitutional: She is oriented to person, place, and time. She appears well-developed and well-nourished.  HENT:  Head: Normocephalic and atraumatic.  Mouth/Throat: Oropharynx is clear and moist.  Eyes: Conjunctivae and EOM are normal. Pupils are equal, round, and reactive to light.  Neck: Normal range of motion.  Cardiovascular: Normal rate, regular rhythm and normal heart sounds.   Pulmonary/Chest: Effort normal and breath sounds normal. No respiratory distress. She has no wheezes.  Abdominal: Soft. Bowel sounds are normal. There is tenderness in the left lower quadrant.  Abdomen soft, non-distended, minimal tenderness in LLQ; normal bowel sounds  Musculoskeletal: Normal range of motion.  Neurological: She is alert and oriented to person, place, and time.  Skin: Skin is warm and dry.  Psychiatric: She has a normal mood and affect.  Nursing note and vitals reviewed.   ED Course  Procedures (including critical care time) Labs Review Labs Reviewed  CBC WITH DIFFERENTIAL/PLATELET - Abnormal; Notable for the following:    RBC 5.72 (*)    Hemoglobin 16.1 (*)    HCT 49.1 (*)    All other components within normal limits  COMPREHENSIVE METABOLIC PANEL - Abnormal; Notable for the following:    CO2 17 (*)    Glucose, Bld 289 (*)     Creatinine, Ser 1.14 (*)    Albumin 3.4 (*)    GFR calc non Af Amer 47 (*)    GFR calc Af Amer 54 (*)    Anion gap 18 (*)    All other components within normal limits  LIPASE, BLOOD - Abnormal; Notable for the following:    Lipase 12 (*)    All other components within normal limits  PROTIME-INR - Abnormal; Notable for the following:    Prothrombin Time 29.4 (*)    INR 2.85 (*)    All other components within normal limits  I-STAT TROPOININ, ED     EKG Interpretation   Date/Time:  Thursday May 29 2014 13:09:47 EDT Ventricular Rate:  126 PR Interval:  136 QRS Duration: 110 QT Interval:  322 QTC Calculation: 466 R Axis:   -62 Text Interpretation:  Sinus tachycardia Left anterior fascicular block  Left ventricular hypertrophy with repolarization abnormality Cannot rule  out Septal infarct , age undetermined Abnormal ECG rate is faster compared  to Jan 2016 Confirmed by [name]  MD, SCOTT [phone]) on 05/29/2014 1:34:50  PM      MDM   Final diagnoses:  Colon stricture  Nausea and vomiting, vomiting of unspecified type   74 year old female with multiple recent ED visits for abdominal pain, nausea, and vomiting, here with the same. She was evaluated by GI, Dr. [name], recommended colonoscopy. When she developed worsening nausea and vomiting last night after attempting to eat Jell-O, she was encouraged to come in today for admission. Patient is afebrile, nontoxic. She is tachycardic which I suspect is secondary to dehydration given her persistent nausea and vomiting. She denies any chest pain or shortness of breath. Abdominal exam with minimal tenderness in her left lower quadrant. I have reviewed her prior CT and abdominal films, no definitive bowel obstruction.  Given her soft abdomen, normal bowel sounds, no active N/V here, and ability to pass flatus, I'm not entirely convinced that she needs repeat imaging at this time.  Will obtain labs and plan to speak with GI.  2:47 PM Lab  work overall reassuring-- bicarb slightly decreased, likely due to N/V. Case discussed with GI, Dr. [name]-- feels sx are due to her colonic strictures, will see patient later today will plan for colonoscopy while hospitalized.  Discussed with hospitalist service, will admit for further management.  Garlon Hatchet, PA-C 05/29/14 1553  Pricilla Loveless, MD 05/31/14 [phone]

## 2014-05-29 NOTE — Progress Notes (Signed)
Pt admitted to the unit at 1733. Pt mental status is A&OX4. Pt oriented to room, staff, and call bell. Skin is intact. Full assessment charted in [facility]. Call bell within reach. Visitor guidelines reviewed w/ pt and/or family.

## 2014-05-29 NOTE — ED Notes (Signed)
Pt placed in room, in gown, and on cardiac monitor.

## 2014-05-29 NOTE — Consult Note (Signed)
Reason for Consult: Sigmoid colon stricture Referring Physician: [facility]  Hannah [name] HPI: This is a 75 year old female with a PMH of DVT s/p IVC filter, chronic anticoagulation. , HTN, DM, and diverticulitis 04/2008 admitted for abdominal pain, nausea, vomiting, and sigmoid colon stricture.  She has been to the ER multiple times over the past week.  Her pain started over a week ago and it started as hyperactive bowel sounds.  Progressively her symptoms worsened and she started to have more issues with abdominal pain, nausea, and vomiting.  Her last bowel movement was one week ago and she cannot recall the last time she had flatus.  Her one and only colonoscopy was 04/2003 with Dr. Mann and scattered diverticula were identified.  In the 04/2008 she was admitted for a mild sigmoid diverticulitis and she denies any further diverticulitis episodes since that time.  Currently her INR is in the 5 range and she has an IVC filter for bilateral massive pulmonary emboli.    Past Medical History  Diagnosis Date  . Thyroid disease   . DVT (deep venous thrombosis)   . Acute massive pulmonary embolism 07/13/2012    Massive PE w/ PEA arrest 07/13/12 >TNK >IVC filter >discharged on comadin    . Cardiogenic shock 07/13/2012  . Hemorrhagic shock 07/15/2012  . Acute renal failure 07/14/2012  . Acute respiratory failure with hypoxia 07/14/2012  . Hyperlipemia 02/25/2013  . Hypertension 06/06/2013  . Diabetes mellitus with neurological manifestations, controlled 06/06/2013    Past Surgical History  Procedure Laterality Date  . Sp arthro hip*l*    . Knee arthroscopy    . Insertion of vena cava filter N/A 07/16/2012    Procedure: INSERTION OF VENA CAVA FILTER;  Surgeon: Charles E Fields, MD;  Location: [facility] CATH [facility];  Service: Cardiovascular;  Laterality: N/A;    No family history on file.  Social History:  reports that she quit smoking about 46 years ago. Her smoking use included Cigarettes. She has a 10  pack-year smoking history. She has never used smokeless tobacco. She reports that she does not drink alcohol or use illicit drugs.  Allergies:  Allergies  Allergen Reactions  . Augmentin [Amoxicillin-Pot Clavulanate] Other (See Comments)    Severe vaginal itching  . Tranxene [Clorazepate] Itching    Medications:  Scheduled: . insulin aspart  0-9 Units Subcutaneous 6 times per day   Continuous: . sodium chloride 75 mL/hr at 05/29/14 1559    Results for orders placed or performed during the hospital encounter of 05/29/14 (from the past 24 hour(s))  CBC with Differential     Status: Abnormal   Collection Time: 05/29/14  1:10 PM  Result Value Ref Range   WBC 6.3 4.0 - 10.5 K/uL   RBC 5.72 (H) 3.87 - 5.11 MIL/uL   Hemoglobin 16.1 (H) 12.0 - 15.0 g/dL   HCT 49.1 (H) 36.0 - 46.0 %   MCV 85.8 78.0 - 100.0 fL   MCH 28.1 26.0 - 34.0 pg   MCHC 32.8 30.0 - 36.0 g/dL   RDW 14.4 11.5 - 15.5 %   Platelets 304 150 - 400 K/uL   Neutrophils Relative % 70 43 - 77 %   Neutro Abs 4.4 1.7 - 7.7 K/uL   Lymphocytes Relative 20 12 - 46 %   Lymphs Abs 1.3 0.7 - 4.0 K/uL   Monocytes Relative 10 3 - 12 %   Monocytes Absolute 0.7 0.1 - 1.0 K/uL   Eosinophils Relative 0 0 -   5 %   Eosinophils Absolute 0.0 0.0 - 0.7 K/uL   Basophils Relative 0 0 - 1 %   Basophils Absolute 0.0 0.0 - 0.1 K/uL  Comprehensive metabolic panel     Status: Abnormal   Collection Time: 05/29/14  1:10 PM  Result Value Ref Range   Sodium 137 135 - 145 mmol/L   Potassium 3.7 3.5 - 5.1 mmol/L   Chloride 102 101 - 111 mmol/L   CO2 17 (L) 22 - 32 mmol/L   Glucose, Bld 289 (H) 65 - 99 mg/dL   BUN 10 6 - 20 mg/dL   Creatinine, Ser 1.14 (H) 0.44 - 1.00 mg/dL   Calcium 8.9 8.9 - 10.3 mg/dL   Total Protein 7.2 6.5 - 8.1 g/dL   Albumin 3.4 (L) 3.5 - 5.0 g/dL   AST 16 15 - 41 U/L   ALT 14 14 - 54 U/L   Alkaline Phosphatase 51 38 - 126 U/L   Total Bilirubin 0.9 0.3 - 1.2 mg/dL   GFR calc non Af Amer 47 (L) >60 mL/min   GFR calc  Af Amer 54 (L) >60 mL/min   Anion gap 18 (H) 5 - 15  Lipase, blood     Status: Abnormal   Collection Time: 05/29/14  1:10 PM  Result Value Ref Range   Lipase 12 (L) 22 - 51 U/L  Protime-INR     Status: Abnormal   Collection Time: 05/29/14  1:11 PM  Result Value Ref Range   Prothrombin Time 29.4 (H) 11.6 - 15.2 seconds   INR 2.85 (H) 0.00 - 1.49  I-stat troponin, ED (only if pt is 73 y.o. or older & pain is above umbilicus)  not at [facility], [facility]     Status: None   Collection Time: 05/29/14  1:22 PM  Result Value Ref Range   Troponin i, poc 0.00 0.00 - 0.08 ng/mL   Comment 3             No results found.  ROS:  As stated above in the HPI otherwise negative.  Blood pressure 154/96, pulse 103, temperature 98.2 F (36.8 C), temperature source Oral, resp. rate 21, SpO2 99 %.    PE: Gen: Uncomfortable, Alert and Oriented HEENT:  [place]/AT, EOMI Neck: Supple, no LAD Lungs: CTA Bilaterally CV: RRR without M/G/R ABM: Soft, tender in the lower abdomen, tympanic, +BS Ext: No C/C/E  Assessment/Plan: 1) Colonic stricture. 2) Diverticular disease. 3) ABM pain. 4) Nausea and vomiting.   I think her symptoms are secondary to a diverticular stricture.  I cannot completely exclude a malignant source, but she does not have any issues with hematochezia with an elevated INR of 5.0.  I will perform further evaluation with a FFS tomorrow.  Regardless of the status of the stricture, i.e., malignant or benign, she will require surgical intervention to relieve the obstruction.  Plan: 1) FFS tomorrow.  Hannah [name] D 05/29/2014, 4:02 PM      

## 2014-05-29 NOTE — ED Notes (Signed)
PA at bedside.

## 2014-05-29 NOTE — ED Notes (Signed)
Pt told to come back to ED per GI dr-- dr [name]--- 3rd visit here-- for abd pain, nausea, vomiting. Tachycardic at triage.

## 2014-05-30 ENCOUNTER — Encounter (HOSPITAL_COMMUNITY): Payer: Self-pay

## 2014-05-30 ENCOUNTER — Encounter (HOSPITAL_COMMUNITY)
Admission: EM | Disposition: A | Discharge status: Skilled Nursing Facility | Payer: Self-pay | Source: Home / Self Care | Attending: Internal Medicine

## 2014-05-30 HISTORY — PX: FLEXIBLE SIGMOIDOSCOPY: SHX5431

## 2014-05-30 LAB — GLUCOSE, CAPILLARY
GLUCOSE-CAPILLARY: 115 mg/dL — AB (ref 65–99)
GLUCOSE-CAPILLARY: 136 mg/dL — AB (ref 65–99)
GLUCOSE-CAPILLARY: 141 mg/dL — AB (ref 65–99)
GLUCOSE-CAPILLARY: 146 mg/dL — AB (ref 65–99)
GLUCOSE-CAPILLARY: 173 mg/dL — AB (ref 65–99)
Glucose-Capillary: 163 mg/dL — ABNORMAL HIGH (ref 65–99)

## 2014-05-30 LAB — PREALBUMIN: Prealbumin: 11.8 mg/dL — ABNORMAL LOW (ref 18–38)

## 2014-05-30 LAB — COMPREHENSIVE METABOLIC PANEL
ALK PHOS: 41 U/L (ref 38–126)
ALT: 10 U/L — ABNORMAL LOW (ref 14–54)
AST: 14 U/L — ABNORMAL LOW (ref 15–41)
Albumin: 2.9 g/dL — ABNORMAL LOW (ref 3.5–5.0)
Anion gap: 13 (ref 5–15)
BUN: 10 mg/dL (ref 6–20)
CALCIUM: 8.4 mg/dL — AB (ref 8.9–10.3)
CO2: 19 mmol/L — ABNORMAL LOW (ref 22–32)
Chloride: 107 mmol/L (ref 101–111)
Creatinine, Ser: 0.98 mg/dL (ref 0.44–1.00)
GFR calc Af Amer: >60 mL/min (ref >60)
GFR, EST NON AFRICAN AMERICAN: 56 mL/min — AB (ref >60)
Glucose, Bld: 170 mg/dL — ABNORMAL HIGH (ref 65–99)
Potassium: 3.6 mmol/L (ref 3.5–5.1)
Sodium: 139 mmol/L (ref 135–145)
TOTAL PROTEIN: 6.1 g/dL — AB (ref 6.5–8.1)
Total Bilirubin: 0.8 mg/dL (ref 0.3–1.2)

## 2014-05-30 LAB — PROTIME-INR
INR: 2.94 — ABNORMAL HIGH (ref 0.00–1.49)
PROTHROMBIN TIME: 30.1 s — AB (ref 11.6–15.2)

## 2014-05-30 LAB — CBC
HCT: 44.9 % (ref 36.0–46.0)
HEMOGLOBIN: 14.5 g/dL (ref 12.0–15.0)
MCH: 28.2 pg (ref 26.0–34.0)
MCHC: 32.3 g/dL (ref 30.0–36.0)
MCV: 87.2 fL (ref 78.0–100.0)
Platelets: 271 10*3/uL (ref 150–400)
RBC: 5.15 MIL/uL — ABNORMAL HIGH (ref 3.87–5.11)
RDW: 14.8 % (ref 11.5–15.5)
WBC: 5.2 10*3/uL (ref 4.0–10.5)

## 2014-05-30 SURGERY — SIGMOIDOSCOPY, FLEXIBLE
Anesthesia: Moderate Sedation

## 2014-05-30 MED ORDER — SODIUM CHLORIDE 0.9 % IJ SOLN
10.0000 mL | INTRAMUSCULAR | Status: DC | PRN
  Administered 2014-05-30 – 2014-06-02 (×4): 10 mL
  Administered 2014-06-04: 30 mL
  Administered 2014-06-05 – 2014-06-06 (×3): 10 mL
  Administered 2014-06-06: 20 mL
  Administered 2014-06-06 – 2014-06-13 (×5): 10 mL
  Filled 2014-05-30 (×14): qty 40

## 2014-05-30 MED ORDER — INSULIN ASPART 100 UNIT/ML ~~LOC~~ SOLN
0.0000 [IU] | SUBCUTANEOUS | Status: DC
  Administered 2014-05-30: 3 [IU] via SUBCUTANEOUS
  Administered 2014-05-31: 5 [IU] via SUBCUTANEOUS
  Administered 2014-05-31 (×4): 3 [IU] via SUBCUTANEOUS
  Administered 2014-05-31: 2 [IU] via SUBCUTANEOUS
  Administered 2014-06-01: 5 [IU] via SUBCUTANEOUS
  Administered 2014-06-01 – 2014-06-02 (×6): 3 [IU] via SUBCUTANEOUS
  Administered 2014-06-02: 2 [IU] via SUBCUTANEOUS
  Administered 2014-06-02 (×2): 3 [IU] via SUBCUTANEOUS
  Administered 2014-06-02: 5 [IU] via SUBCUTANEOUS
  Administered 2014-06-02: 3 [IU] via SUBCUTANEOUS
  Administered 2014-06-03: 11 [IU] via SUBCUTANEOUS
  Administered 2014-06-03 (×3): 3 [IU] via SUBCUTANEOUS
  Administered 2014-06-03: 11 [IU] via SUBCUTANEOUS
  Administered 2014-06-04: 8 [IU] via SUBCUTANEOUS
  Administered 2014-06-04: 11 [IU] via SUBCUTANEOUS
  Administered 2014-06-04: 8 [IU] via SUBCUTANEOUS

## 2014-05-30 MED ORDER — DIPHENHYDRAMINE HCL 50 MG/ML IJ SOLN
INTRAMUSCULAR | Status: AC
  Filled 2014-05-30: qty 1

## 2014-05-30 MED ORDER — FENTANYL CITRATE (PF) 100 MCG/2ML IJ SOLN
INTRAMUSCULAR | Status: DC | PRN
  Administered 2014-05-30 (×2): 25 ug via INTRAVENOUS

## 2014-05-30 MED ORDER — SODIUM CHLORIDE 0.9 % IV SOLN
INTRAVENOUS | Status: AC
  Administered 2014-05-30: 18:00:00 via INTRAVENOUS

## 2014-05-30 MED ORDER — CLINIMIX E/DEXTROSE (5/15) 5 % IV SOLN
INTRAVENOUS | Status: DC
  Filled 2014-05-30: qty 960

## 2014-05-30 MED ORDER — MIDAZOLAM HCL 5 MG/ML IJ SOLN
INTRAMUSCULAR | Status: AC
  Filled 2014-05-30: qty 2

## 2014-05-30 MED ORDER — FENTANYL CITRATE (PF) 100 MCG/2ML IJ SOLN
INTRAMUSCULAR | Status: AC
  Filled 2014-05-30: qty 2

## 2014-05-30 MED ORDER — TRACE MINERALS CR-CU-F-FE-I-MN-MO-SE-ZN IV SOLN
INTRAVENOUS | Status: AC
  Administered 2014-05-30: 17:00:00 via INTRAVENOUS
  Filled 2014-05-30: qty 960

## 2014-05-30 MED ORDER — HYDRALAZINE HCL 20 MG/ML IJ SOLN
10.0000 mg | Freq: Four times a day (QID) | INTRAMUSCULAR | Status: DC | PRN
  Filled 2014-05-30: qty 1

## 2014-05-30 MED ORDER — MIDAZOLAM HCL 10 MG/2ML IJ SOLN
INTRAMUSCULAR | Status: DC | PRN
  Administered 2014-05-30 (×2): 2 mg via INTRAVENOUS

## 2014-05-30 NOTE — Progress Notes (Signed)
Peripherally Inserted Central Catheter/Midline Placement  The IV Nurse has discussed with the patient and/or persons authorized to consent for the patient, the purpose of this procedure and the potential benefits and risks involved with this procedure.  The benefits include less needle sticks, lab draws from the catheter and patient may be discharged home with the catheter.  Risks include, but not limited to, infection, bleeding, blood clot (thrombus formation), and puncture of an artery; nerve damage and irregular heat beat.  Alternatives to this procedure were also discussed.  PICC/Midline Placement Documentation        Lisabeth Devoid 05/30/2014, 2:16 PM Consent obtained by Lazarus Gowda, RN

## 2014-05-30 NOTE — Interval H&P Note (Signed)
History and Physical Interval Note:  05/30/2014 7:51 AM  Hannah [name]  has presented today for surgery, with the diagnosis of Sigmoid stricture  The various methods of treatment have been discussed with the patient and family. After consideration of risks, benefits and other options for treatment, the patient has consented to  Procedure(s): FLEXIBLE SIGMOIDOSCOPY (N/A) as a surgical intervention .  The patient's history has been reviewed, patient examined, no change in status, stable for surgery.  I have reviewed the patient's chart and labs.  Questions were answered to the patient's satisfaction.     Perrin Gens D

## 2014-05-30 NOTE — Progress Notes (Signed)
PARENTERAL NUTRITION CONSULT NOTE - INITIAL  Pharmacy Consult for TPN Indication: Colonic stricture  Allergies  Allergen Reactions  . Augmentin [Amoxicillin-Pot Clavulanate] Other (See Comments)    Severe vaginal itching  . Tranxene [Clorazepate] Itching    Patient Measurements: Height: '5\' 7"'  (170.2 cm) Weight: 200 lb 1.6 oz (90.765 kg) IBW/kg (Calculated) : 61.6    Vital Signs: Temp: 98 F (36.7 C) (05/27 0828) Temp Source: Oral (05/27 0828) BP: 155/82 mmHg (05/27 1232) Pulse Rate: 88 (05/27 1232) Intake/Output from previous day: 05/26 0701 - 05/27 0700 In: 0  Out: 700 [Urine:700] Intake/Output from this shift:    Labs:  Recent Labs  05/29/14 1310 05/29/14 1311 05/30/14 0638  WBC 6.3  --  5.2  HGB 16.1*  --  14.5  HCT 49.1*  --  44.9  PLT 304  --  271  INR  --  2.85* 2.94*     Recent Labs  05/29/14 1310 05/30/14 0638 05/30/14 1250  NA 137 139  --   K 3.7 3.6  --   CL 102 107  --   CO2 17* 19*  --   GLUCOSE 289* 170*  --   BUN 10 10  --   CREATININE 1.14* 0.98  --   CALCIUM 8.9 8.4*  --   PROT 7.2 6.1*  --   ALBUMIN 3.4* 2.9*  --   AST 16 14*  --   ALT 14 10*  --   ALKPHOS 51 41  --   BILITOT 0.9 0.8  --   PREALBUMIN  --   --  11.8*   Estimated Creatinine Clearance: 59.2 mL/min (by C-G formula based on Cr of 0.98).    Recent Labs  05/30/14 0412 05/30/14 0903 05/30/14 1215  GLUCAP 163* 146* 115*    Medical History: Past Medical History  Diagnosis Date  . Thyroid disease   . DVT (deep venous thrombosis)   . Acute massive pulmonary embolism 07/13/2012    Massive PE w/ PEA arrest 07/13/12 >TNK >IVC filter >discharged on comadin    . Cardiogenic shock 07/13/2012  . Hemorrhagic shock 07/15/2012  . Acute renal failure 07/14/2012  . Acute respiratory failure with hypoxia 07/14/2012  . Hyperlipemia 02/25/2013  . Hypertension 06/06/2013  . Diabetes mellitus with neurological manifestations, controlled 06/06/2013    Medications:  Prescriptions  prior to admission  Medication Sig Dispense Refill Last Dose  . glipiZIDE (GLUCOTROL) 5 MG tablet Take 1 tablet (5 mg total) by mouth daily before breakfast. 90 tablet 3 05/28/2014 at Unknown time  . levothyroxine (SYNTHROID, LEVOTHROID) 25 MCG tablet Take 1 tablet by mouth  daily before breakfast 90 tablet 3 05/28/2014 at Unknown time  . metFORMIN (GLUCOPHAGE) 1000 MG tablet 1051m (1 tablet) with breakfast and 10072m(1 tablet) with dinner. (Patient taking differently: Take 1,000 mg by mouth 2 (two) times daily with a meal. 100090m1 tablet) with breakfast and 1000m50m tablet) with dinner.) 180 tablet 3 05/28/2014 at Unknown time  . metoprolol tartrate (LOPRESSOR) 25 MG tablet Take 1 tablet (25 mg total) by mouth 2 (two) times daily. 180 tablet 3 05/28/2014 at 0800  . ondansetron (ZOFRAN ODT) 4 MG disintegrating tablet Take 1 tablet (4 mg total) by mouth every 8 (eight) hours as needed for nausea or vomiting. 20 tablet 0 Past Week at Unknown time  . oxyCODONE-acetaminophen (PERCOCET/ROXICET) 5-325 MG per tablet Take 1 tablet by mouth every 6 (six) hours as needed. (Patient taking differently: Take 1 tablet by mouth every  6 (six) hours as needed for moderate pain. ) 15 tablet 0 05/28/2014 at Unknown time  . simvastatin (ZOCOR) 20 MG tablet Take 1 tablet (20 mg total) by mouth at bedtime. 90 tablet 3 05/28/2014 at Unknown time  . vitamin C (ASCORBIC ACID) 500 MG tablet Take 500 mg by mouth daily.    05/28/2014 at Unknown time  . warfarin (COUMADIN) 5 MG tablet Take 2.5-5 mg by mouth See admin instructions. Take 1 tablet all days except on Tues and Sat take 1/2 tablet   Past Week at Unknown time  . glucose blood (ONETOUCH VERIO) test strip Test daily. 100 each 12 unknown    Insulin Requirements in the past 24 hours:  0 units - not ordered  Current Nutrition:  Clear liquids  Assessment: 74 year old woman found to have a colonic stricture on colonoscopy today.  Will need a hartman's procedure at some  point, probably next week.  Surgery is being delayed to allow her INR to fall.    Nutritional Goals:  RD consult ordered for recommendations  Surgeries/Procedures:   5/27: colonoscopy - colonic stricture found  GI: Colonic stricture - will need surgery at some point when INR falls.  Prealbumin 11.8 at baseilne.  Endo: On metformin and glipizide PTA.  CBG 115-163 prior to initiation of TPN.  Synthroid 25 mcg pta  Lytes: K 3.6, Na 139  Renal: Scr 0.98 today  Pulm: Was 94% with room air prior to colonoscopy.  On warfarin PTA for a history of PE  Cards: History of HTN and HLD - Lopressor, simvastatin PTA  Hepatobil: AST and ALT below normal range, alk phos normal, tbili 0.8.  Neuro: OK  ID:   Not infected at this time  Best Practices: warfarin  TPN Access: PICC line to be placed 5/27  TPN start date: 5/27  Plan:  Start Clinimix E at 33m/hr - also add thiamine and folic acid to TPN.  This will provide 682 KCAL and 48g protein. Start moderately sensitive SSI Reduce IV fluids to 35 mL/hr when TPN starts TPN labs in AM Follow up with goals after RD assessment   FCandie Mile5/27/2016,1:43 PM

## 2014-05-30 NOTE — Consult Note (Signed)
Reason for Consult:  Sigmoid stricture Referring Physician:  Dr. Carol Ada  Hannah [name] is an 74 y.o. female.  HPI: 74 y/o in ED on 5/20 and again 5/22 with abdominal pain, vomiting and diarrhea.  INR 5/22 - 5.49, her coumadin was held, she was hydrated and sent home.  She returned to ED again yesterday afternoon with pain, nausea, and vomiting. She had been treated with Miralax for constipation and had not had a BM in 1 week. She reports regular BM's prior to last week.  Nothing given her has work and she has been eating broth and jello last 5 days till she was admitted yesterday with ongoing nausea, vomiting and pain.  No BM for 1 week and no flatus, and prior CT findings led to colonosocopy by Dr. Benson Norway early this AM;  showing a sigmoid stricture he could not traverse with the adult scope, with some distal diverticula.   She has a history of diverticulitis in 2012, and Hx of at least one colonoscopy 2005 by Dr. Collene Mares.  She was due for another colonoscopy back in 07/2012, but it was preempted by a PE with PEA arrest, with a complicated course.  She has been on coumadin since that time She is afebrile, a little hypertensive, labs are OK, glucose is up, and TP is low.  CT of the abdomen on 05/23/14 her first ED visit showed: No significant vascular abnormality of the abdomen. No arterial stenoses. Widely patent SMA and SMV. Diffuse dilatation of the fluid-filled colon to the junction of the descending and sigmoid portions. The possibility of a stricture secondary to prior diverticulitis or mucosal mass should be considered.  Small amount of ascites in the abdomen.    INR is down to 2.94 today. She had some results with the enema, but no BM for a week.  She vomited the Miralax given her pre-admit.   We are ask to see for her benign stricture.  Past Medical History  Diagnosis Date  Diabetes mellitus with neurological manifestations, controlled 06/06/2013     Acute massive pulmonary embolism 07/13/2012    Massive PE w/ PEA arrest 07/13/12 >TNK >IVC filter >discharged on comadin       Cardiogenic shock   07/13/12  Hemorrhagic shock  07/2012  Acute renal failure  07/2012  Acute respiratory failure with hypoxia  07/2012       Thyroid disease   DVT (deep venous thrombosis)  At least as far back as 2010   Hyperlipemia 02/25/2013  Hypertension 06/06/2013  EF 89-37% diastolic grade 1 dysfunction, mild MR, PA 31 mm HG on Echo -  11/2013   Hx of tobacco use     Past Surgical History  Procedure Laterality Date  . Sp arthro hip*l*    . Knee arthroscopy    . Insertion of vena cava filter N/A 07/16/2012    Procedure: INSERTION OF VENA CAVA FILTER;  Surgeon: Elam Dutch, MD;  Location: [facility] CATH LAB;  Service: Cardiovascular;  Laterality: N/A;    History reviewed. No pertinent family history.  Social History:  reports that she quit smoking about 46 years ago. Her smoking use included Cigarettes. She has a 10 pack-year smoking history. She has never used smokeless tobacco. She reports that she does not drink alcohol or use illicit drugs.  Allergies:  Allergies  Allergen Reactions  . Augmentin [Amoxicillin-Pot Clavulanate] Other (See Comments)    Severe vaginal itching  . Tranxene [Clorazepate] Itching    Medications:  Prior to Admission:  Prescriptions prior to admission  Medication Sig Dispense Refill Last Dose  . glipiZIDE (GLUCOTROL) 5 MG tablet Take 1 tablet (5 mg total) by mouth daily before breakfast. 90 tablet 3 05/28/2014 at Unknown time  . levothyroxine (SYNTHROID, LEVOTHROID) 25 MCG tablet Take 1 tablet by mouth  daily before breakfast 90 tablet 3 05/28/2014 at Unknown time  . metFORMIN (GLUCOPHAGE) 1000 MG tablet 1015m (1 tablet) with breakfast and 10048m(1 tablet) with dinner. (Patient taking differently: Take 1,000 mg by mouth 2 (two) times daily with a meal. 100039m1 tablet) with breakfast and 1000m80m tablet) with dinner.) 180 tablet 3 05/28/2014 at Unknown time  . metoprolol  tartrate (LOPRESSOR) 25 MG tablet Take 1 tablet (25 mg total) by mouth 2 (two) times daily. 180 tablet 3 05/28/2014 at 0800  . ondansetron (ZOFRAN ODT) 4 MG disintegrating tablet Take 1 tablet (4 mg total) by mouth every 8 (eight) hours as needed for nausea or vomiting. 20 tablet 0 Past Week at Unknown time  . oxyCODONE-acetaminophen (PERCOCET/ROXICET) 5-325 MG per tablet Take 1 tablet by mouth every 6 (six) hours as needed. (Patient taking differently: Take 1 tablet by mouth every 6 (six) hours as needed for moderate pain. ) 15 tablet 0 05/28/2014 at Unknown time  . simvastatin (ZOCOR) 20 MG tablet Take 1 tablet (20 mg total) by mouth at bedtime. 90 tablet 3 05/28/2014 at Unknown time  . vitamin C (ASCORBIC ACID) 500 MG tablet Take 500 mg by mouth daily.    05/28/2014 at Unknown time  . warfarin (COUMADIN) 5 MG tablet Take 2.5-5 mg by mouth See admin instructions. Take 1 tablet all days except on Tues and Sat take 1/2 tablet   Past Week at Unknown time  . glucose blood (ONETOUCH VERIO) test strip Test daily. 100 each 12 unknown   Scheduled: . antiseptic oral rinse  7 mL Mouth Rinse q12n4p  . chlorhexidine  15 mL Mouth Rinse BID  . folic acid  1 mg Intravenous Daily  . insulin aspart  0-9 Units Subcutaneous 6 times per day  . levothyroxine  12.5 mcg Intravenous Daily  . metoprolol  5 mg Intravenous 4 times per day  . thiamine  100 mg Intravenous Daily   Continuous: . sodium chloride 75 mL/hr at 05/30/14 0554   PRN:[id number]) IV, promethazine Anti-infectives    None      Results for orders placed or performed during the hospital encounter of 05/29/14 (from the past 48 hour(s))  CBC with Differential     Status: Abnormal   Collection Time: 05/29/14  1:10 PM  Result Value Ref Range   WBC 6.3 4.0 - 10.5 K/uL   RBC 5.72 (H) 3.87 - 5.11 MIL/uL   Hemoglobin 16.1 (H) 12.0 - 15.0 g/dL   HCT 49.1 (H) 36.0 - 46.0 %   MCV 85.8 78.0 - 100.0 fL   MCH 28.1 26.0 - 34.0 pg   MCHC 32.8 30.0 -  36.0 g/dL   RDW 14.4 11.5 - 15.5 %   Platelets 304 150 - 400 K/uL   Neutrophils Relative % 70 43 - 77 %   Neutro Abs 4.4 1.7 - 7.7 K/uL   Lymphocytes Relative 20 12 - 46 %   Lymphs Abs 1.3 0.7 - 4.0 K/uL   Monocytes Relative 10 3 - 12 %   Monocytes Absolute 0.7 0.1 - 1.0 K/uL   Eosinophils Relative 0 0 - 5 %   Eosinophils Absolute 0.0 0.0 - 0.7 K/uL  Basophils Relative 0 0 - 1 %   Basophils Absolute 0.0 0.0 - 0.1 K/uL  Comprehensive metabolic panel     Status: Abnormal   Collection Time: 05/29/14  1:10 PM  Result Value Ref Range   Sodium 137 135 - 145 mmol/L   Potassium 3.7 3.5 - 5.1 mmol/L   Chloride 102 101 - 111 mmol/L   CO2 17 (L) 22 - 32 mmol/L   Glucose, Bld 289 (H) 65 - 99 mg/dL   BUN 10 6 - 20 mg/dL   Creatinine, Ser 1.14 (H) 0.44 - 1.00 mg/dL   Calcium 8.9 8.9 - 10.3 mg/dL   Total Protein 7.2 6.5 - 8.1 g/dL   Albumin 3.4 (L) 3.5 - 5.0 g/dL   AST 16 15 - 41 U/L   ALT 14 14 - 54 U/L   Alkaline Phosphatase 51 38 - 126 U/L   Total Bilirubin 0.9 0.3 - 1.2 mg/dL   GFR calc non Af Amer 47 (L) >60 mL/min   GFR calc Af Amer 54 (L) >60 mL/min    Comment: (NOTE) The eGFR has been calculated using the CKD EPI equation. This calculation has not been validated in all clinical situations. eGFR's persistently <60 mL/min signify possible Chronic Kidney Disease.    Anion gap 18 (H) 5 - 15  Lipase, blood     Status: Abnormal   Collection Time: 05/29/14  1:10 PM  Result Value Ref Range   Lipase 12 (L) 22 - 51 U/L  Protime-INR     Status: Abnormal   Collection Time: 05/29/14  1:11 PM  Result Value Ref Range   Prothrombin Time 29.4 (H) 11.6 - 15.2 seconds   INR 2.85 (H) 0.00 - 1.49  I-stat troponin, ED (only if pt is 73 y.o. or older & pain is above umbilicus)  not at [facility], [facility]     Status: None   Collection Time: 05/29/14  1:22 PM  Result Value Ref Range   Troponin i, poc 0.00 0.00 - 0.08 ng/mL   Comment 3            Comment: Due to the release kinetics of cTnI, a negative  result within the first hours of the onset of symptoms does not rule out myocardial infarction with certainty. If myocardial infarction is still suspected, repeat the test at appropriate intervals.   CBG monitoring, ED     Status: Abnormal   Collection Time: 05/29/14  4:03 PM  Result Value Ref Range   Glucose-Capillary 194 (H) 65 - 99 mg/dL  Glucose, capillary     Status: Abnormal   Collection Time: 05/29/14  8:16 PM  Result Value Ref Range   Glucose-Capillary 162 (H) 65 - 99 mg/dL  Glucose, capillary     Status: Abnormal   Collection Time: 05/30/14 12:01 AM  Result Value Ref Range   Glucose-Capillary 141 (H) 65 - 99 mg/dL  Glucose, capillary     Status: Abnormal   Collection Time: 05/30/14  4:12 AM  Result Value Ref Range   Glucose-Capillary 163 (H) 65 - 99 mg/dL  Protime-INR     Status: Abnormal   Collection Time: 05/30/14  6:38 AM  Result Value Ref Range   Prothrombin Time 30.1 (H) 11.6 - 15.2 seconds   INR 2.94 (H) 0.00 - 1.49  Comprehensive metabolic panel     Status: Abnormal   Collection Time: 05/30/14  6:38 AM  Result Value Ref Range   Sodium 139 135 - 145 mmol/L   Potassium  3.6 3.5 - 5.1 mmol/L   Chloride 107 101 - 111 mmol/L   CO2 19 (L) 22 - 32 mmol/L   Glucose, Bld 170 (H) 65 - 99 mg/dL   BUN 10 6 - 20 mg/dL   Creatinine, Ser 0.98 0.44 - 1.00 mg/dL   Calcium 8.4 (L) 8.9 - 10.3 mg/dL   Total Protein 6.1 (L) 6.5 - 8.1 g/dL   Albumin 2.9 (L) 3.5 - 5.0 g/dL   AST 14 (L) 15 - 41 U/L   ALT 10 (L) 14 - 54 U/L   Alkaline Phosphatase 41 38 - 126 U/L   Total Bilirubin 0.8 0.3 - 1.2 mg/dL   GFR calc non Af Amer 56 (L) >60 mL/min   GFR calc Af Amer >60 >60 mL/min    Comment: (NOTE) The eGFR has been calculated using the CKD EPI equation. This calculation has not been validated in all clinical situations. eGFR's persistently <60 mL/min signify possible Chronic Kidney Disease.    Anion gap 13 5 - 15  CBC     Status: Abnormal   Collection Time: 05/30/14  6:38 AM   Result Value Ref Range   WBC 5.2 4.0 - 10.5 K/uL   RBC 5.15 (H) 3.87 - 5.11 MIL/uL   Hemoglobin 14.5 12.0 - 15.0 g/dL   HCT 44.9 36.0 - 46.0 %   MCV 87.2 78.0 - 100.0 fL   MCH 28.2 26.0 - 34.0 pg   MCHC 32.3 30.0 - 36.0 g/dL   RDW 14.8 11.5 - 15.5 %   Platelets 271 150 - 400 K/uL  Glucose, capillary     Status: Abnormal   Collection Time: 05/30/14  9:03 AM  Result Value Ref Range   Glucose-Capillary 146 (H) 65 - 99 mg/dL    No results found.  ROS Blood pressure 187/83, pulse 99, temperature 98 F (36.7 C), temperature source Oral, resp. rate 19, height '5\' 7"'  (1.702 m), weight 90.765 kg (200 lb 1.6 oz), SpO2 97 %. Physical Exam  Assessment/Plan: Sigmoid stricture with obstruction Hx of PE.PEA arrest, Shock, acute respiratory failure and acute renal failure; IVC placement and chronic coumadin 07/2012 Chronic anticoagulation:   INR 2.94 today  Hypertension AODM Hypothyroid Hx of DVT back to 2010, perhaps longer Hx of tobacco use, 30 years, quit 1990  Plan:  She will need a colon resection at some point.  She will need Medical clearance and she will need to be transitioned to Heparin before surgery and after.   I will check prealbumin and Dr. Hulen Skains will see later this AM.  She reports she was  fine till around 05/23/14, when all her symptoms began.    Hannah [name] 05/30/2014, 9:13 AM

## 2014-05-30 NOTE — Progress Notes (Signed)
Initial Nutrition Assessment  DOCUMENTATION CODES:  Obesity unspecified  INTERVENTION:  TPN  NUTRITION DIAGNOSIS:  Inadequate oral intake related to altered GI function as evidenced by other (see comment) (clears liquids, on TPN for colonic stricture).   GOAL:  Patient will meet greater than or equal to 90% of their needs   MONITOR:  Diet advancement, Labs, Weight trends, Skin, I & O's, Other (Comment) (TPN initation/adequacy/tolerance)  REASON FOR ASSESSMENT:  Consult New TPN/TNA  ASSESSMENT: This is a 74 yo female w/ history DM, diverticulosis, DVT/PE on chronic Coumadin, HTN, and dyslipidemia. Presented w/ intractable N/V in setting of recent dx colonic stricture. Pt has presented to ER on multiple occasions this week for abdominal pain that has progressively worsened and has become assoc with N/V. Sx's began one week ago; CT abdomen 5/20 revealed stricture at desc and sigmoid colon. Plan was for pt to check w/ PCP re OK to hold Coumadin for colonoscopy then proceed as OP. On 5/21 INR was 5.49 so she was instructed to hold her Coumadin. Unfortunately her sx's worsened and she came to ER.  S/p Procedure(s) 05/30/14: FLEXIBLE SIGMOIDOSCOPY (N/A)  Pt asleep at time of visit. No signs of fat or muscle depleiton noted.   Surgical notes reveal obstruction; likely plan for Hartmann's procedure. Procedure is currently delayed due to high INR.  Per chart review, UBW: 208#. Noted a 3.8% wt loss x 1 week, likely due to dehydration.   PICC line placed to initiate TPN. Per pharmacy note, plan is to start Clinimix E at 80mL/hr - also add thiamine and folic acid to TPN, which will provide 682 KCAL and 48g protein (meets 37% of estimated kcal needs and 56% of estimated protein needs).   Labs reviewed. K WDL.   Height:  Ht Readings from Last 1 Encounters:  05/29/14 5\' 7"  (1.702 m)    Weight:  Wt Readings from Last 1 Encounters:  05/29/14 200 lb 1.6 oz (90.765 kg)    Ideal  Body Weight:  61.4 kg  Wt Readings from Last 10 Encounters:  05/29/14 200 lb 1.6 oz (90.765 kg)  05/25/14 208 lb (94.348 kg)  04/07/14 208 lb 12.8 oz (94.711 kg)  12/06/13 210 lb 3.2 oz (95.346 kg)  11/07/13 215 lb 12.8 oz (97.886 kg)  09/06/13 208 lb (94.348 kg)  06/06/13 212 lb 8 oz (96.389 kg)  03/12/13 213 lb 3.2 oz (96.707 kg)  03/07/13 214 lb 12.8 oz (97.433 kg)  02/25/13 210 lb (95.255 kg)    BMI:  Body mass index is 31.33 kg/(m^2).  Estimated Nutritional Needs:  Kcal:  1850-2050  Protein:  85-95 grams  Fluid:  1.8-2.0 L  Skin:  Reviewed, no issues  Diet Order:  Diet clear liquid Room service appropriate?: Yes; Fluid consistency:: Thin .TPN (CLINIMIX-E) Adult  EDUCATION NEEDS:  No education needs identified at this time   Intake/Output Summary (Last 24 hours) at 05/30/14 1536 Last data filed at 05/30/14 1517  Gross per 24 hour  Intake      0 ml  Output    700 ml  Net   -700 ml    Last BM:  05/22/14  Cyril Railey A. Mayford Knife, RD, LDN, CDE Pager: [phone] After hours Pager: [phone]

## 2014-05-30 NOTE — Progress Notes (Signed)
ANTICOAGULATION CONSULT NOTE - Initial Consult  Pharmacy Consult for herparin Indication: pulmonary embolus  Allergies  Allergen Reactions  . Augmentin [Amoxicillin-Pot Clavulanate] Other (See Comments)    Severe vaginal itching  . Tranxene [Clorazepate] Itching    Patient Measurements: Height: 5\' 7"  (170.2 cm) Weight: 200 lb 1.6 oz (90.765 kg) IBW/kg (Calculated) : 61.6 Heparin Dosing Weight: 81.5 kg  Vital Signs: Temp: 98 F (36.7 C) (05/27 0828) Temp Source: Oral (05/27 0828) BP: 187/83 mmHg (05/27 0836) Pulse Rate: 99 (05/27 0835)  Labs:  Recent Labs  05/29/14 1310 05/29/14 1311 05/30/14 0638  HGB 16.1*  --  14.5  HCT 49.1*  --  44.9  PLT 304  --  271  LABPROT  --  29.4* 30.1*  INR  --  2.85* 2.94*  CREATININE 1.14*  --  0.98    Estimated Creatinine Clearance: 59.2 mL/min (by C-G formula based on Cr of 0.98).   Medical History: Past Medical History  Diagnosis Date  . Thyroid disease   . DVT (deep venous thrombosis)   . Acute massive pulmonary embolism 07/13/2012    Massive PE w/ PEA arrest 07/13/12 >TNK >IVC filter >discharged on comadin    . Cardiogenic shock 07/13/2012  . Hemorrhagic shock 07/15/2012  . Acute renal failure 07/14/2012  . Acute respiratory failure with hypoxia 07/14/2012  . Hyperlipemia 02/25/2013  . Hypertension 06/06/2013  . Diabetes mellitus with neurological manifestations, controlled 06/06/2013    Medications:  Prescriptions prior to admission  Medication Sig Dispense Refill Last Dose  . glipiZIDE (GLUCOTROL) 5 MG tablet Take 1 tablet (5 mg total) by mouth daily before breakfast. 90 tablet 3 05/28/2014 at Unknown time  . levothyroxine (SYNTHROID, LEVOTHROID) 25 MCG tablet Take 1 tablet by mouth  daily before breakfast 90 tablet 3 05/28/2014 at Unknown time  . metFORMIN (GLUCOPHAGE) 1000 MG tablet 1000mg  (1 tablet) with breakfast and 1000mg  (1 tablet) with dinner. (Patient taking differently: Take 1,000 mg by mouth 2 (two) times daily with a  meal. 1000mg  (1 tablet) with breakfast and 1000mg  (1 tablet) with dinner.) 180 tablet 3 05/28/2014 at Unknown time  . metoprolol tartrate (LOPRESSOR) 25 MG tablet Take 1 tablet (25 mg total) by mouth 2 (two) times daily. 180 tablet 3 05/28/2014 at 0800  . ondansetron (ZOFRAN ODT) 4 MG disintegrating tablet Take 1 tablet (4 mg total) by mouth every 8 (eight) hours as needed for nausea or vomiting. 20 tablet 0 Past Week at Unknown time  . oxyCODONE-acetaminophen (PERCOCET/ROXICET) 5-325 MG per tablet Take 1 tablet by mouth every 6 (six) hours as needed. (Patient taking differently: Take 1 tablet by mouth every 6 (six) hours as needed for moderate pain. ) 15 tablet 0 05/28/2014 at Unknown time  . simvastatin (ZOCOR) 20 MG tablet Take 1 tablet (20 mg total) by mouth at bedtime. 90 tablet 3 05/28/2014 at Unknown time  . vitamin C (ASCORBIC ACID) 500 MG tablet Take 500 mg by mouth daily.    05/28/2014 at Unknown time  . warfarin (COUMADIN) 5 MG tablet Take 2.5-5 mg by mouth See admin instructions. Take 1 tablet all days except on Tues and Sat take 1/2 tablet   Past Week at Unknown time  . glucose blood (ONETOUCH VERIO) test strip Test daily. 100 each 12 unknown    Assessment: 74 yo lady on coumadin for h/o PE, DVT to start heparin when INR <2.0.  Current INR is 2.94. She had a colonoscopy which showed stricture so surgery will be consulted. Goal  of Therapy:  Heparin level 0.3-0.7 units/ml Monitor platelets by anticoagulation protocol: Yes   Plan:  Will check INR tomorrow  Thanks for allowing pharmacy to be a part of this patient's care.  Talbert Cage, PharmD Clinical Pharmacist, [phone]  05/30/2014,9:48 AM

## 2014-05-30 NOTE — Progress Notes (Addendum)
PROGRESS NOTE  Hannah [name] WUJ:[id number] DOB: 10-11-1940 DOA: 05/29/2014 PCP: [name]., DO  Assessment/Plan: Colonic stricture -admit to medical floor -GI consulted; s/p colonoscopy- showed stricture so surgical consult -discussed with surgery- will try clears and get PICC for TNA -IVF at 75/hr -supportive care w/antiemetics    Dehydration/Acute renal failure -IVFs  Follow labs   Hypothyroid -hold Synthroid PO while NPO-convert to IV dosing   Hypertension -hold PO meds for now -on BB so provide scheduled IV dosing -PRN hydralazine   Diabetes mellitus with neurological manifestations, controlled -hold Glucotrol -SSI q 4 while NPO   Long term current use of anticoagulant therapy/History of pulmonary embolism -hold Coumadin in anticipation of IP colonoscopy -begin Heparin infusion once INR </= 2.0  Code Status: full Family Communication: no family at bedside Disposition Plan:    Consultants:  GI  surgery  Procedures:  sigmoidoscopy  Antibiotics:    HPI/Subjective: No SOB, no CP Sleepy from procedure  Objective: Filed Vitals:   05/30/14 0836  BP: 187/83  Pulse:   Temp:   Resp: 19    Intake/Output Summary (Last 24 hours) at 05/30/14 0938 Last data filed at 05/30/14 0530  Gross per 24 hour  Intake      0 ml  Output    700 ml  Net   -700 ml   Filed Weights   05/29/14 1733  Weight: 90.765 kg (200 lb 1.6 oz)    Exam:   General:  Pleasant/cooperative  Cardiovascular: rrr  Respiratory: clear  Abdomen: +BS, soft  Musculoskeletal: no edema   Data Reviewed: Basic Metabolic Panel:  Recent Labs Lab 05/25/14 0550 05/29/14 1310 05/30/14 0638  NA 133* 137 139  K 3.7 3.7 3.6  CL 100* 102 107  CO2 18* 17* 19*  GLUCOSE 160* 289* 170*  BUN 21* 10 10  CREATININE 1.20* 1.14* 0.98  CALCIUM 8.8* 8.9 8.4*   Liver Function Tests:  Recent Labs Lab 05/25/14 0550 05/29/14 1310 05/30/14 0638  AST 18 16 14*  ALT 12* 14 10*   ALKPHOS 45 51 41  BILITOT 0.9 0.9 0.8  PROT 7.1 7.2 6.1*  ALBUMIN 3.4* 3.4* 2.9*    Recent Labs Lab 05/29/14 1310  LIPASE 12*   No results for input(s): AMMONIA in the last 168 hours. CBC:  Recent Labs Lab 05/25/14 0550 05/29/14 1310 05/30/14 0638  WBC 8.5 6.3 5.2  NEUTROABS 4.8 4.4  --   HGB 15.4* 16.1* 14.5  HCT 46.5* 49.1* 44.9  MCV 85.8 85.8 87.2  PLT 264 304 271   Cardiac Enzymes: No results for input(s): CKTOTAL, CKMB, CKMBINDEX, TROPONINI in the last 168 hours. BNP (last 3 results) No results for input(s): BNP in the last 8760 hours.  ProBNP (last 3 results) No results for input(s): PROBNP in the last 8760 hours.  CBG:  Recent Labs Lab 05/29/14 1603 05/29/14 2016 05/30/14 0001 05/30/14 0412 05/30/14 0903  GLUCAP 194* 162* 141* 163* 146*    No results found for this or any previous visit (from the past 240 hour(s)).   Studies: No results found.  Scheduled Meds: . antiseptic oral rinse  7 mL Mouth Rinse q12n4p  . chlorhexidine  15 mL Mouth Rinse BID  . folic acid  1 mg Intravenous Daily  . insulin aspart  0-9 Units Subcutaneous 6 times per day  . levothyroxine  12.5 mcg Intravenous Daily  . metoprolol  5 mg Intravenous 4 times per day  . thiamine  100 mg Intravenous Daily  Continuous Infusions: . sodium chloride 75 mL/hr at 05/30/14 0554   Antibiotics Given (last 72 hours)    None      Principal Problem:   Colonic stricture Active Problems:   Long term current use of anticoagulant therapy   Hypothyroid   Hypertension   Diabetes mellitus with neurological manifestations, controlled   History of pulmonary embolism   Dehydration   Acute renal failure    Time spent: 25 min    Hannah [name]  [facility] Pager [phone]. If 7PM-7AM, please contact night-coverage at [email or web address], password [facility] 05/30/2014, 9:38 AM  LOS: 1 day

## 2014-05-30 NOTE — H&P (View-Only) (Signed)
Reason for Consult: Sigmoid colon stricture Referring Physician: [facility]  [name] HPI: This is a 74 year old female with a PMH of DVT s/p IVC filter, chronic anticoagulation. , HTN, DM, and diverticulitis 04/2008 admitted for abdominal pain, nausea, vomiting, and sigmoid colon stricture.  She has been to the ER multiple times over the past week.  Her pain started over a week ago and it started as hyperactive bowel sounds.  Progressively her symptoms worsened and she started to have more issues with abdominal pain, nausea, and vomiting.  Her last bowel movement was one week ago and she cannot recall the last time she had flatus.  Her one and only colonoscopy was 04/2003 with Dr. [name] and scattered diverticula were identified.  In the 04/2008 she was admitted for a mild sigmoid diverticulitis and she denies any further diverticulitis episodes since that time.  Currently her INR is in the 5 range and she has an IVC filter for bilateral massive pulmonary emboli.    Past Medical History  Diagnosis Date  . Thyroid disease   . DVT (deep venous thrombosis)   . Acute massive pulmonary embolism 07/13/2012    Massive PE w/ PEA arrest 07/13/12 >TNK >IVC filter >discharged on comadin    . Cardiogenic shock 07/13/2012  . Hemorrhagic shock 07/15/2012  . Acute renal failure 07/14/2012  . Acute respiratory failure with hypoxia 07/14/2012  . Hyperlipemia 02/25/2013  . Hypertension 06/06/2013  . Diabetes mellitus with neurological manifestations, controlled 06/06/2013    Past Surgical History  Procedure Laterality Date  . Sp arthro hip*l*    . Knee arthroscopy    . Insertion of vena cava filter N/A 07/16/2012    Procedure: INSERTION OF VENA CAVA FILTER;  Surgeon: [name], MD;  Location: [facility] CATH LAB;  Service: Cardiovascular;  Laterality: N/A;    No family history on file.  Social History:  reports that she quit smoking about 46 years ago. Her smoking use included Cigarettes. She has a 10  pack-year smoking history. She has never used smokeless tobacco. She reports that she does not drink alcohol or use illicit drugs.  Allergies:  Allergies  Allergen Reactions  . Augmentin [Amoxicillin-Pot Clavulanate] Other (See Comments)    Severe vaginal itching  . Tranxene [Clorazepate] Itching    Medications:  Scheduled: . insulin aspart  0-9 Units Subcutaneous 6 times per day   Continuous: . sodium chloride 75 mL/hr at 05/29/14 1559    Results for orders placed or performed during the hospital encounter of 05/29/14 (from the past 24 hour(s))  CBC with Differential     Status: Abnormal   Collection Time: 05/29/14  1:10 PM  Result Value Ref Range   WBC 6.3 4.0 - 10.5 K/uL   RBC 5.72 (H) 3.87 - 5.11 MIL/uL   Hemoglobin 16.1 (H) 12.0 - 15.0 g/dL   HCT 10.2 (H) 72.5 - 36.6 %   MCV 85.8 78.0 - 100.0 fL   MCH 28.1 26.0 - 34.0 pg   MCHC 32.8 30.0 - 36.0 g/dL   RDW 44.0 34.7 - 42.5 %   Platelets 304 150 - 400 K/uL   Neutrophils Relative % 70 43 - 77 %   Neutro Abs 4.4 1.7 - 7.7 K/uL   Lymphocytes Relative 20 12 - 46 %   Lymphs Abs 1.3 0.7 - 4.0 K/uL   Monocytes Relative 10 3 - 12 %   Monocytes Absolute 0.7 0.1 - 1.0 K/uL   Eosinophils Relative 0 0 -  5 %   Eosinophils Absolute 0.0 0.0 - 0.7 K/uL   Basophils Relative 0 0 - 1 %   Basophils Absolute 0.0 0.0 - 0.1 K/uL  Comprehensive metabolic panel     Status: Abnormal   Collection Time: 05/29/14  1:10 PM  Result Value Ref Range   Sodium 137 135 - 145 mmol/L   Potassium 3.7 3.5 - 5.1 mmol/L   Chloride 102 101 - 111 mmol/L   CO2 17 (L) 22 - 32 mmol/L   Glucose, Bld 289 (H) 65 - 99 mg/dL   BUN 10 6 - 20 mg/dL   Creatinine, Ser 4.09 (H) 0.44 - 1.00 mg/dL   Calcium 8.9 8.9 - 81.1 mg/dL   Total Protein 7.2 6.5 - 8.1 g/dL   Albumin 3.4 (L) 3.5 - 5.0 g/dL   AST 16 15 - 41 U/L   ALT 14 14 - 54 U/L   Alkaline Phosphatase 51 38 - 126 U/L   Total Bilirubin 0.9 0.3 - 1.2 mg/dL   GFR calc non Af Amer 47 (L) >60 mL/min   GFR calc  Af Amer 54 (L) >60 mL/min   Anion gap 18 (H) 5 - 15  Lipase, blood     Status: Abnormal   Collection Time: 05/29/14  1:10 PM  Result Value Ref Range   Lipase 12 (L) 22 - 51 U/L  Protime-INR     Status: Abnormal   Collection Time: 05/29/14  1:11 PM  Result Value Ref Range   Prothrombin Time 29.4 (H) 11.6 - 15.2 seconds   INR 2.85 (H) 0.00 - 1.49  I-stat troponin, ED (only if pt is 73 y.o. or older & pain is above umbilicus)  not at [facility]     Status: None   Collection Time: 05/29/14  1:22 PM  Result Value Ref Range   Troponin i, poc 0.00 0.00 - 0.08 ng/mL   Comment 3             No results found.  ROS:  As stated above in the HPI otherwise negative.  Blood pressure 154/96, pulse 103, temperature 98.2 F (36.8 C), temperature source Oral, resp. rate 21, SpO2 99 %.    PE: Gen: Uncomfortable, Alert and Oriented HEENT:  [place]/AT, EOMI Neck: Supple, no LAD Lungs: CTA Bilaterally CV: RRR without M/G/R ABM: Soft, tender in the lower abdomen, tympanic, +BS Ext: No C/C/E  Assessment/Plan: 1) Colonic stricture. 2) Diverticular disease. 3) ABM pain. 4) Nausea and vomiting.   I think her symptoms are secondary to a diverticular stricture.  I cannot completely exclude a malignant source, but she does not have any issues with hematochezia with an elevated INR of 5.0.  I will perform further evaluation with a FFS tomorrow.  Regardless of the status of the stricture, i.e., malignant or benign, she will require surgical intervention to relieve the obstruction.  Plan: 1) FFS tomorrow.  Feliza Diven D 05/29/2014, 4:02 PM

## 2014-05-30 NOTE — Op Note (Signed)
[facility] [address] [place], [address]   FLEXIBLE SIGMOIDOSCOPY PROCEDURE REPORT  PATIENT: [name], Hannah  MR#: [id number] BIRTHDATE: May 19, 1940 , 74  yrs. old GENDER: female ENDOSCOPIST: Jeani Hawking, MD REFERRED BY: PROCEDURE DATE:  05/30/2014 PROCEDURE:   Sigmoidoscopy, diagnostic ASA CLASS:   Class III INDICATIONS: Sigmoid obstruction MEDICATIONS: Versed 4 mg IV and Fentanyl 50 mcg IV  DESCRIPTION OF PROCEDURE:   After the risks benefits and alternatives of the procedure were thoroughly explained, informed consent was obtained.  Digital exam revealed no abnormalities of the rectum. The     endoscope was introduced through the anus  and advanced to the sigmoid colon , The exam was Without limitations. The quality of the prep was The overall prep quality was good. . Estimated blood loss is zero unless otherwise noted in this procedure report. The instrument was then slowly withdrawn as the mucosa was fully examined.      FINDINGS: There was a significant amount of thick liquid stool in the distal colon that was washed and suctioned.  Excellent views of the mucosa were obtained after the washing.  At apprxoimately 35 cm a benign stricture was encountered (Images #2 and #3).  Just distal to this area were diverticula.  With multiple attempts and with abdominal pressure the adult endoscope was not able to be passed through the stricture.    Retroflexion was not performed.    The scope was then withdrawn from the patient and the procedure terminated.  COMPLICATIONS: There were no immediate complications.  ENDOSCOPIC IMPRESSION: 1) Benign-appearing sigmoid colon stricture - unable to traverse. 2) Diverticula.  RECOMMENDATIONS: 1) Surgical consultation.  REPEAT EXAM:  eSigned:  Jeani Hawking, MD 05/30/2014 8:29 AM   CC:

## 2014-05-31 LAB — DIFFERENTIAL
BASOS PCT: 1 % (ref 0–1)
Basophils Absolute: 0 10*3/uL (ref 0.0–0.1)
Eosinophils Absolute: 0.1 10*3/uL (ref 0.0–0.7)
Eosinophils Relative: 2 % (ref 0–5)
LYMPHS ABS: 2.3 10*3/uL (ref 0.7–4.0)
Lymphocytes Relative: 41 % (ref 12–46)
MONO ABS: 0.8 10*3/uL (ref 0.1–1.0)
Monocytes Relative: 13 % — ABNORMAL HIGH (ref 3–12)
NEUTROS PCT: 43 % (ref 43–77)
Neutro Abs: 2.4 10*3/uL (ref 1.7–7.7)

## 2014-05-31 LAB — COMPREHENSIVE METABOLIC PANEL
ALT: 9 U/L — ABNORMAL LOW (ref 14–54)
AST: 12 U/L — AB (ref 15–41)
Albumin: 2.6 g/dL — ABNORMAL LOW (ref 3.5–5.0)
Alkaline Phosphatase: 37 U/L — ABNORMAL LOW (ref 38–126)
Anion gap: 7 (ref 5–15)
BUN: 8 mg/dL (ref 6–20)
CHLORIDE: 102 mmol/L (ref 101–111)
CO2: 26 mmol/L (ref 22–32)
Calcium: 8 mg/dL — ABNORMAL LOW (ref 8.9–10.3)
Creatinine, Ser: 0.7 mg/dL (ref 0.44–1.00)
GFR calc Af Amer: >60 mL/min (ref >60)
GFR calc non Af Amer: >60 mL/min (ref >60)
Glucose, Bld: 179 mg/dL — ABNORMAL HIGH (ref 65–99)
Potassium: 3 mmol/L — ABNORMAL LOW (ref 3.5–5.1)
Sodium: 135 mmol/L (ref 135–145)
TOTAL PROTEIN: 5 g/dL — AB (ref 6.5–8.1)
Total Bilirubin: 0.8 mg/dL (ref 0.3–1.2)

## 2014-05-31 LAB — GLUCOSE, CAPILLARY
Glucose-Capillary: 144 mg/dL — ABNORMAL HIGH (ref 65–99)
Glucose-Capillary: 155 mg/dL — ABNORMAL HIGH (ref 65–99)
Glucose-Capillary: 162 mg/dL — ABNORMAL HIGH (ref 65–99)
Glucose-Capillary: 171 mg/dL — ABNORMAL HIGH (ref 65–99)
Glucose-Capillary: 171 mg/dL — ABNORMAL HIGH (ref 65–99)
Glucose-Capillary: 226 mg/dL — ABNORMAL HIGH (ref 65–99)

## 2014-05-31 LAB — MAGNESIUM: Magnesium: 1.2 mg/dL — ABNORMAL LOW (ref 1.7–2.4)

## 2014-05-31 LAB — LACTIC ACID, PLASMA: Lactic Acid, Venous: 1.1 mmol/L (ref 0.5–2.0)

## 2014-05-31 LAB — CBC
HCT: 40.3 % (ref 36.0–46.0)
HEMOGLOBIN: 13 g/dL (ref 12.0–15.0)
MCH: 28 pg (ref 26.0–34.0)
MCHC: 32.3 g/dL (ref 30.0–36.0)
MCV: 86.9 fL (ref 78.0–100.0)
Platelets: 256 10*3/uL (ref 150–400)
RBC: 4.64 MIL/uL (ref 3.87–5.11)
RDW: 14.8 % (ref 11.5–15.5)
WBC: 5.6 10*3/uL (ref 4.0–10.5)

## 2014-05-31 LAB — PROTIME-INR
INR: 1.63 — ABNORMAL HIGH (ref 0.00–1.49)
PROTHROMBIN TIME: 19.4 s — AB (ref 11.6–15.2)

## 2014-05-31 LAB — PHOSPHORUS: Phosphorus: 2.1 mg/dL — ABNORMAL LOW (ref 2.5–4.6)

## 2014-05-31 LAB — HEPARIN LEVEL (UNFRACTIONATED): Heparin Unfractionated: 1.16 IU/mL — ABNORMAL HIGH (ref 0.30–0.70)

## 2014-05-31 LAB — TRIGLYCERIDES: Triglycerides: 91 mg/dL (ref <150)

## 2014-05-31 MED ORDER — HEPARIN BOLUS VIA INFUSION
4000.0000 [IU] | Freq: Once | INTRAVENOUS | Status: DC
  Filled 2014-05-31: qty 4000

## 2014-05-31 MED ORDER — HEPARIN (PORCINE) IN NACL 100-0.45 UNIT/ML-% IJ SOLN
1350.0000 [IU]/h | INTRAMUSCULAR | Status: DC
  Filled 2014-05-31: qty 250

## 2014-05-31 MED ORDER — HEPARIN (PORCINE) IN NACL 100-0.45 UNIT/ML-% IJ SOLN
1100.0000 [IU]/h | INTRAMUSCULAR | Status: DC
  Administered 2014-06-01: 1250 [IU]/h via INTRAVENOUS
  Filled 2014-05-31 (×3): qty 250

## 2014-05-31 MED ORDER — POTASSIUM CHLORIDE 10 MEQ/50ML IV SOLN
10.0000 meq | INTRAVENOUS | Status: AC
  Administered 2014-05-31 (×3): 10 meq via INTRAVENOUS
  Filled 2014-05-31 (×3): qty 50

## 2014-05-31 MED ORDER — MAGNESIUM SULFATE 4 GM/100ML IV SOLN
4.0000 g | Freq: Once | INTRAVENOUS | Status: AC
  Administered 2014-05-31: 4 g via INTRAVENOUS
  Filled 2014-05-31: qty 100

## 2014-05-31 MED ORDER — SODIUM CHLORIDE 0.9 % IV SOLN
INTRAVENOUS | Status: AC
  Administered 2014-06-03: 10 mL/h via INTRAVENOUS
  Administered 2014-06-05 – 2014-06-06 (×2): via INTRAVENOUS

## 2014-05-31 MED ORDER — FAT EMULSION 20 % IV EMUL
240.0000 mL | INTRAVENOUS | Status: AC
  Administered 2014-05-31: 240 mL via INTRAVENOUS
  Filled 2014-05-31: qty 250

## 2014-05-31 MED ORDER — POTASSIUM PHOSPHATES 15 MMOLE/5ML IV SOLN
20.0000 mmol | Freq: Once | INTRAVENOUS | Status: AC
  Administered 2014-05-31: 20 mmol via INTRAVENOUS
  Filled 2014-05-31: qty 6.67

## 2014-05-31 MED ORDER — HEPARIN (PORCINE) IN NACL 100-0.45 UNIT/ML-% IJ SOLN
1450.0000 [IU]/h | INTRAMUSCULAR | Status: DC
  Administered 2014-05-31: 1450 [IU]/h via INTRAVENOUS
  Filled 2014-05-31 (×2): qty 250

## 2014-05-31 MED ORDER — HEPARIN BOLUS VIA INFUSION
4000.0000 [IU] | Freq: Once | INTRAVENOUS | Status: AC
  Administered 2014-05-31: 4000 [IU] via INTRAVENOUS
  Filled 2014-05-31: qty 4000

## 2014-05-31 MED ORDER — TRACE MINERALS CR-CU-F-FE-I-MN-MO-SE-ZN IV SOLN
INTRAVENOUS | Status: AC
  Administered 2014-05-31: 18:00:00 via INTRAVENOUS
  Filled 2014-05-31: qty 1680

## 2014-05-31 NOTE — Progress Notes (Signed)
ANTICOAGULATION CONSULT NOTE - Follow-Up Consult  Pharmacy Consult for heparin Indication: pulmonary embolus  Allergies  Allergen Reactions  . Augmentin [Amoxicillin-Pot Clavulanate] Other (See Comments)    Severe vaginal itching  . Tranxene [Clorazepate] Itching    Patient Measurements: Height:  (170.2 cm) Weight: 200 lb 1.6 oz (90.765 kg) IBW/kg (Calculated) : 61.6 Heparin Dosing Weight: 81.5 kg  Vital Signs: Temp: 98.4 F (36.9 C) (05/28 0500) Temp Source: Oral (05/28 0500) BP: 150/77 mmHg (05/28 0500) Pulse Rate: 98 (05/28 0500)  Labs:  Recent Labs  05/29/14 1310 05/29/14 1311 05/30/14 0638 05/31/14 0608  HGB 16.1*  --  14.5 13.0  HCT 49.1*  --  44.9 40.3  PLT 304  --  271 256  LABPROT  --  29.4* 30.1* 19.4*  INR  --  2.85* 2.94* 1.63*  CREATININE 1.14*  --  0.98 0.70    Estimated Creatinine Clearance: 72.5 mL/min (by C-G formula based on Cr of 0.7).   Medical History: Past Medical History  Diagnosis Date  . Thyroid disease   . DVT (deep venous thrombosis)   . Acute massive pulmonary embolism 07/13/2012    Massive PE w/ PEA arrest 07/13/12 >TNK >IVC filter >discharged on comadin    . Cardiogenic shock 07/13/2012  . Hemorrhagic shock 07/15/2012  . Acute renal failure 07/14/2012  . Acute respiratory failure with hypoxia 07/14/2012  . Hyperlipemia 02/25/2013  . Hypertension 06/06/2013  . Diabetes mellitus with neurological manifestations, controlled 06/06/2013    Medications:  Prescriptions prior to admission  Medication Sig Dispense Refill Last Dose  . glipiZIDE (GLUCOTROL) 5 MG tablet Take 1 tablet (5 mg total) by mouth daily before breakfast. 90 tablet 3 05/28/2014 at Unknown time  . levothyroxine (SYNTHROID, LEVOTHROID) 25 MCG tablet Take 1 tablet by mouth  daily before breakfast 90 tablet 3 05/28/2014 at Unknown time  . metFORMIN (GLUCOPHAGE) 1000 MG tablet  (1 tablet) with breakfast and  (1 tablet) with dinner. (Patient taking differently:  Take 1,000 mg by mouth 2 (two) times daily with a meal.  (1 tablet) with breakfast and  (1 tablet) with dinner.) 180 tablet 3 05/28/2014 at Unknown time  . metoprolol tartrate (LOPRESSOR) 25 MG tablet Take 1 tablet (25 mg total) by mouth 2 (two) times daily. 180 tablet 3 05/28/2014 at 0800  . ondansetron (ZOFRAN ODT) 4 MG disintegrating tablet Take 1 tablet (4 mg total) by mouth every 8 (eight) hours as needed for nausea or vomiting. 20 tablet 0 Past Week at Unknown time  . oxyCODONE-acetaminophen (PERCOCET/ROXICET) 5-325 MG per tablet Take 1 tablet by mouth every 6 (six) hours as needed. (Patient taking differently: Take 1 tablet by mouth every 6 (six) hours as needed for moderate pain. ) 15 tablet 0 05/28/2014 at Unknown time  . simvastatin (ZOCOR) 20 MG tablet Take 1 tablet (20 mg total) by mouth at bedtime. 90 tablet 3 05/28/2014 at Unknown time  . vitamin C (ASCORBIC ACID) 500 MG tablet Take 500 mg by mouth daily.    05/28/2014 at Unknown time  . warfarin (COUMADIN) 5 MG tablet Take 2.5-5 mg by mouth See admin instructions. Take 1 tablet all days except on Tues and Sat take 1/2 tablet   Past Week at Unknown time  . glucose blood (ONETOUCH VERIO) test strip Test daily. 100 each 12 unknown    Assessment: 74 yo lady on coumadin for h/o PE, DVT to start heparin when INR <2.0.  Current INR is 2.94. She had a colonoscopy which  showed stricture so surgery will be consulted.  INR now less than 2 at 1.63, CBC wnl, sCr 0.7.  Goal of Therapy:  Heparin level 0.3-0.7 units/ml Monitor platelets by anticoagulation protocol: Yes   Plan:  Heparin 4000 unit bolus followed by heparin 1450 units/hr 8h HL Daily HL/CBC Monitor s/sx of bleeding  Arlean Hopping. Newman Pies, PharmD Clinical Pharmacist Pager [phone] 05/31/2014,7:40 AM

## 2014-05-31 NOTE — Progress Notes (Deleted)
ANTICOAGULATION CONSULT NOTE - Follow up Consult  Pharmacy Consult for heparin Indication: pulmonary embolus  Allergies  Allergen Reactions  . Augmentin [Amoxicillin-Pot Clavulanate] Other (See Comments)    Severe vaginal itching  . Tranxene [Clorazepate] Itching    Patient Measurements: Height:  (170.2 cm) Weight: 200 lb 1.6 oz (90.765 kg) IBW/kg (Calculated) : 61.6 Heparin Dosing Weight: 81.14 kg  Vital Signs: Temp: 98.4 F (36.9 C) (05/28 0500) Temp Source: Oral (05/28 0500) BP: 150/77 mmHg (05/28 0500) Pulse Rate: 98 (05/28 0500)  Labs:  Recent Labs  05/29/14 1310 05/29/14 1311 05/30/14 0638 05/31/14 0608  HGB 16.1*  --  14.5 13.0  HCT 49.1*  --  44.9 40.3  PLT 304  --  271 256  LABPROT  --  29.4* 30.1* 19.4*  INR  --  2.85* 2.94* 1.63*  CREATININE 1.14*  --  0.98 0.70    Estimated Creatinine Clearance: 72.5 mL/min (by C-G formula based on Cr of 0.7).   Medical History: Past Medical History  Diagnosis Date  . Thyroid disease   . DVT (deep venous thrombosis)   . Acute massive pulmonary embolism 07/13/2012    Massive PE w/ PEA arrest 07/13/12 >TNK >IVC filter >discharged on comadin    . Cardiogenic shock 07/13/2012  . Hemorrhagic shock 07/15/2012  . Acute renal failure 07/14/2012  . Acute respiratory failure with hypoxia 07/14/2012  . Hyperlipemia 02/25/2013  . Hypertension 06/06/2013  . Diabetes mellitus with neurological manifestations, controlled 06/06/2013    Medications:  Prescriptions prior to admission  Medication Sig Dispense Refill Last Dose  . glipiZIDE (GLUCOTROL) 5 MG tablet Take 1 tablet (5 mg total) by mouth daily before breakfast. 90 tablet 3 05/28/2014 at Unknown time  . levothyroxine (SYNTHROID, LEVOTHROID) 25 MCG tablet Take 1 tablet by mouth  daily before breakfast 90 tablet 3 05/28/2014 at Unknown time  . metFORMIN (GLUCOPHAGE) 1000 MG tablet  (1 tablet) with breakfast and  (1 tablet) with dinner. (Patient taking differently:  Take 1,000 mg by mouth 2 (two) times daily with a meal.  (1 tablet) with breakfast and  (1 tablet) with dinner.) 180 tablet 3 05/28/2014 at Unknown time  . metoprolol tartrate (LOPRESSOR) 25 MG tablet Take 1 tablet (25 mg total) by mouth 2 (two) times daily. 180 tablet 3 05/28/2014 at 0800  . ondansetron (ZOFRAN ODT) 4 MG disintegrating tablet Take 1 tablet (4 mg total) by mouth every 8 (eight) hours as needed for nausea or vomiting. 20 tablet 0 Past Week at Unknown time  . oxyCODONE-acetaminophen (PERCOCET/ROXICET) 5-325 MG per tablet Take 1 tablet by mouth every 6 (six) hours as needed. (Patient taking differently: Take 1 tablet by mouth every 6 (six) hours as needed for moderate pain. ) 15 tablet 0 05/28/2014 at Unknown time  . simvastatin (ZOCOR) 20 MG tablet Take 1 tablet (20 mg total) by mouth at bedtime. 90 tablet 3 05/28/2014 at Unknown time  . vitamin C (ASCORBIC ACID) 500 MG tablet Take 500 mg by mouth daily.    05/28/2014 at Unknown time  . warfarin (COUMADIN) 5 MG tablet Take 2.5-5 mg by mouth See admin instructions. Take 1 tablet all days except on Tues and Sat take 1/2 tablet   Past Week at Unknown time  . glucose blood (ONETOUCH VERIO) test strip Test daily. 100 each 12 unknown    Assessment: 74 yo female on Coumadin for h/o PE, DVT to start heparin when INR <2.0.  INR today down to 1.63. Hgb and plt  stable. She had a colonoscopy which showed stricture so surgery will be consulted.  Goal of Therapy:  Heparin level 0.3-0.7 units/ml Monitor platelets by anticoagulation protocol: Yes   Plan:  -Heparin bolus 4,000 units x1 -Heparin drip at 1350 units/hr -HL at 1600 -Daily HL and CBC, monitor for s/sx bleeding  Megan E. Supple, Pharm.D Clinical Pharmacy Resident Pager: [phone] 05/31/2014 7:39 AM

## 2014-05-31 NOTE — Progress Notes (Signed)
PARENTERAL NUTRITION CONSULT NOTE - INITIAL  Pharmacy Consult for TPN Indication: Colonic stricture  Allergies  Allergen Reactions  . Augmentin [Amoxicillin-Pot Clavulanate] Other (See Comments)    Severe vaginal itching  . Tranxene [Clorazepate] Itching    Patient Measurements: Height: '5\' 7"'  (170.2 cm) Weight: 200 lb 1.6 oz (90.765 kg) IBW/kg (Calculated) : 61.6    Vital Signs: Temp: 98.4 F (36.9 C) (05/28 0500) Temp Source: Oral (05/28 0500) BP: 150/77 mmHg (05/28 0500) Pulse Rate: 98 (05/28 0500) Intake/Output from previous day: 05/27 0701 - 05/28 0700 In: 3114.6 [P.O.:600; I.V.:2456.6; TPN:58] Out: 900 [Urine:900] Intake/Output from this shift: Total I/O In: -  Out: 350 [Urine:350]  Labs:  Recent Labs  05/29/14 1310 05/29/14 1311 05/30/14 0638 05/31/14 0608  WBC 6.3  --  5.2 5.6  HGB 16.1*  --  14.5 13.0  HCT 49.1*  --  44.9 40.3  PLT 304  --  271 256  INR  --  2.85* 2.94* 1.63*     Recent Labs  05/29/14 1310 05/30/14 0638 05/30/14 1250 05/31/14 0608  NA 137 139  --  135  K 3.7 3.6  --  3.0*  CL 102 107  --  102  CO2 17* 19*  --  26  GLUCOSE 289* 170*  --  179*  BUN 10 10  --  8  CREATININE 1.14* 0.98  --  0.70  CALCIUM 8.9 8.4*  --  8.0*  MG  --   --   --  1.2*  PHOS  --   --   --  2.1*  PROT 7.2 6.1*  --  5.0*  ALBUMIN 3.4* 2.9*  --  2.6*  AST 16 14*  --  12*  ALT 14 10*  --  9*  ALKPHOS 51 41  --  37*  BILITOT 0.9 0.8  --  0.8  PREALBUMIN  --   --  11.8*  --   TRIG  --   --   --  91   Estimated Creatinine Clearance: 72.5 mL/min (by C-G formula based on Cr of 0.7).    Recent Labs  05/31/14 0028 05/31/14 0412 05/31/14 0812  GLUCAP 144* 162* 171*    Insulin Requirements in the past 24 hours:  11 units - not ordered  Current Nutrition:  Clear liquids, Clinimix E 33m/hr - 664KCal, 48g protein  Assessment: 74year old woman found to have a colonic stricture on colonoscopy 5/27.  Will need a hartman's procedure at some  point, probably next week.  INR 1.63, heparin infusion started to bridge to surgery.  Nutritional Goals:  1850-2050 Kcal, 85-95g protein  Surgeries/Procedures:   5/27: colonoscopy - colonic stricture found  GI: Colonic stricture - will need surgery at some point..  Prealbumin 11.8 at baseilne. TG 91.  Endo: On metformin and glipizide PTA.  CBG 144-173 after initiation of TPN.  Synthroid 25 mcg pta  Lytes: K 3 (MD replaced with 30 Meq KCL IV), Na 135, Mg 1.2, Phos 2.1  Renal: Scr 0.7 today  Pulm: 98% on RA  On warfarin PTA for a history of PE  Cards: History of HTN and HLD - Lopressor, simvastatin PTA.  150/77, HR 19.  PRN hydralazine, scheduled lopressor  Hepatobil: AST and ALT below normal range, alk phos normal, tbili 0.8.  Neuro: OK  ID:   Not infected at this time  Best Practices:heparin infusion  TPN Access: PICC line  placed 5/27  TPN start date: 5/27  Plan:  Increase Clinimix E to 40m/hr and IVFE at 129mhr - also add thiamine, folic acid, and insulin 15 units to TPN.  This will provide 1673 KCAL and 84g protein. Continue moderately sensitive SSI Reduce IV fluids to KVO when TPN starts Potassium phosphate 20 Mmol x 1 Magnesium sulfate 4g IV x 1 BMET, Mg, Phos in AM   FrCandie Mile/28/2016,9:18 AM

## 2014-05-31 NOTE — Progress Notes (Signed)
PROGRESS NOTE  Hannah [name] ZOX:[id number] DOB: Jun 09, 1940 DOA: 05/29/2014 PCP: [name]., DO  Assessment/Plan: Colonic stricture -admit to medical floor -GI consulted; s/p colonoscopy- showed stricture so surgical consult- plan for surgery early next week -discussed with surgery- tolerating clears and TNA -IVF at 75/hr -supportive care w/antiemetics    Dehydration/Acute renal failure -IVFs  Follow labs   Hypothyroid -hold Synthroid PO while NPO-convert to IV dosing   Hypertension -hold PO meds for now -on BB so provide scheduled IV dosing -PRN hydralazine   Diabetes mellitus with neurological manifestations, controlled -hold Glucotrol -SSI q 4 while NPO   Long term current use of anticoagulant therapy/History of pulmonary embolism -hold Coumadin in anticipation of IP colonoscopy -heparin gtt  Hypokalemia -replete  Code Status: full Family Communication: daughter at bedside 5/27 Disposition Plan:    Consultants:  GI  surgery  Procedures:  sigmoidoscopy  Antibiotics:    HPI/Subjective: No fever no chills Tolerating water and other liquids  Objective: Filed Vitals:   05/31/14 0500  BP: 150/77  Pulse: 98  Temp: 98.4 F (36.9 C)  Resp: 19    Intake/Output Summary (Last 24 hours) at 05/31/14 0825 Last data filed at 05/31/14 0801  Gross per 24 hour  Intake 3114.59 ml  Output   1250 ml  Net 1864.59 ml   Filed Weights   05/29/14 1733  Weight: 90.765 kg (200 lb 1.6 oz)    Exam:   General:  Pleasant/cooperative  Cardiovascular: rrr  Respiratory: clear  Abdomen: +BS, soft  Musculoskeletal: no edema   Data Reviewed: Basic Metabolic Panel:  Recent Labs Lab 05/25/14 0550 05/29/14 1310 05/30/14 0638 05/31/14 0608  NA 133* 137 139 135  K 3.7 3.7 3.6 3.0*  CL 100* 102 107 102  CO2 18* 17* 19* 26  GLUCOSE 160* 289* 170* 179*  BUN 21* CREATININE 1.20* 1.14* 0.98 0.70  CALCIUM 8.8* 8.9 8.4* 8.0*  MG  --    --   --  1.2*  PHOS  --   --   --  2.1*   Liver Function Tests:  Recent Labs Lab 05/25/14 0550 05/29/14 1310 05/30/14 0638 05/31/14 0608  AST 18 16 14* 12*  ALT 12* 14 10* 9*  ALKPHOS 45 51 41 37*  BILITOT 0.9 0.9 0.8 0.8  PROT 7.1 7.2 6.1* 5.0*  ALBUMIN 3.4* 3.4* 2.9* 2.6*    Recent Labs Lab 05/29/14 1310  LIPASE 12*   No results for input(s): AMMONIA in the last 168 hours. CBC:  Recent Labs Lab 05/25/14 0550 05/29/14 1310 05/30/14 0638 05/31/14 0608  WBC 8.5 6.3 5.2 5.6  NEUTROABS 4.8 4.4  --  2.4  HGB 15.4* 16.1* 14.5 13.0  HCT 46.5* 49.1* 44.9 40.3  MCV 85.8 85.8 87.2 86.9  PLT 264 304 271 256   Cardiac Enzymes: No results for input(s): CKTOTAL, CKMB, CKMBINDEX, TROPONINI in the last 168 hours. BNP (last 3 results) No results for input(s): BNP in the last 8760 hours.  ProBNP (last 3 results) No results for input(s): PROBNP in the last 8760 hours.  CBG:  Recent Labs Lab 05/30/14 1215 05/30/14 1711 05/30/14 2013 05/31/14 0028 05/31/14 0412  GLUCAP 115* 136* 173* 144* 162*    No results found for this or any previous visit (from the past 240 hour(s)).   Studies: No results found.  Scheduled Meds: . antiseptic oral rinse  7 mL Mouth Rinse q12n4p  . chlorhexidine  15 mL Mouth Rinse BID  .  heparin  4,000 Units Intravenous Once  . insulin aspart  0-15 Units Subcutaneous 6 times per day  . levothyroxine  12.5 mcg Intravenous Daily  . metoprolol  5 mg Intravenous 4 times per day  . potassium chloride  10 mEq Intravenous Q1 Hr x 3   Continuous Infusions: . Marland KitchenTPN (CLINIMIX-E) Adult 40 mL/hr at 05/30/14 1710  . sodium chloride 35 mL/hr at 05/30/14 1744  . heparin     Antibiotics Given (last 72 hours)    None      Principal Problem:   Colonic stricture Active Problems:   Long term current use of anticoagulant therapy   Hypothyroid   Hypertension   Diabetes mellitus with neurological manifestations, controlled   History of pulmonary  embolism   Dehydration   Acute renal failure    Time spent: 25 min    Trigg Delarocha  [facility] Pager [phone]. If 7PM-7AM, please contact night-coverage at [email or web address], password [facility] 05/31/2014, 8:25 AM  LOS: 2 days

## 2014-05-31 NOTE — Progress Notes (Signed)
1 Day Post-Op  Subjective: Patient is comfortable and alert. Tolerating clear liquids. Denies pain or nausea. Has not had a bowel movement. INR 1.63  Objective: Vital signs in last 24 hours: Temp:  [98.4 F (36.9 C)-98.6 F (37 C)] 98.4 F (36.9 C) (05/28 0500) Pulse Rate:  [85-98] 98 (05/28 0500) Resp:  [18-19] 19 (05/28 0500) BP: (149-157)/(73-83) 150/77 mmHg (05/28 0500) SpO2:  [97 %-98 %] 98 % (05/28 0500) Last BM Date: 05/21/14  Intake/Output from previous day: 05/27 0701 - 05/28 0700 In: 3114.6 [P.O.:600; I.V.:2456.6; TPN:58] Out: 900 [Urine:900] Intake/Output this shift: Total I/O In: 200 [P.O.:200] Out: 350 [Urine:350]    EXAM: General appearance: Alert. No distress. Pleasant. Mental status normal. Resp: clear to auscultation bilaterally GI: Abdomen soft. Perhaps slightly distended. Nontender. Lower midline incision well-healed.  Lab Results:   Recent Labs  05/30/14 0638 05/31/14 0608  WBC 5.2 5.6  HGB 14.5 13.0  HCT 44.9 40.3  PLT 271 256   BMET  Recent Labs  05/30/14 0638 05/31/14 0608  NA 139 135  K 3.6 3.0*  CL 107 102  CO2 19* 26  GLUCOSE 170* 179*  BUN 10 8  CREATININE 0.98 0.70  CALCIUM 8.4* 8.0*   PT/INR  Recent Labs  05/30/14 0638 05/31/14 0608  LABPROT 30.1* 19.4*  INR 2.94* 1.63*   ABG No results for input(s): PHART, HCO3 in the last 72 hours.  Invalid input(s): PCO2, PO2  Studies/Results: No results found.  Anti-infectives:  Anti-infectives    None      Assessment/Plan: s/p Procedure(s): FLEXIBLE SIGMOIDOSCOPY   Sigmoid stricture with obstruction, likely benign. She will need to undergo colon resection and probable colostomy on Tuesday or Wednesday of this coming week    Continue clear liquid diet only    Continue TNA    We will follow and schedule surgery   Hx of PE  With PEA arrest, Shock, acute respiratory failure and acute renal failure; IVC placement and chronic coumadin 07/2012 Chronic  anticoagulation: INR 1.95 today.  Pharmacy to manage heparin bridge Hypertension AODM Hypothyroid Hx of DVT back to 2010, perhaps longer Hx of tobacco use, 30 years, quit 1990  Plan: She will need a colon resection this coming Tuesday or Wednesday.  Apparently had echo today and medical risk assessment is underway..    LOS: 2 days    Hannah [name] 05/31/2014

## 2014-05-31 NOTE — Plan of Care (Signed)
Problem: Phase I Progression Outcomes Goal: Initial discharge plan identified Outcome: Completed/Met Date Met:  05/31/14 To return home

## 2014-05-31 NOTE — Plan of Care (Signed)
Problem: Phase II Progression Outcomes Goal: Progress activity as tolerated unless otherwise ordered Outcome: Completed/Met Date Met:  05/31/14 OOB to chair

## 2014-05-31 NOTE — Progress Notes (Signed)
ANTICOAGULATION CONSULT NOTE - Follow-Up Consult  Pharmacy Consult for heparin Indication: pulmonary embolus  Allergies  Allergen Reactions  . Augmentin [Amoxicillin-Pot Clavulanate] Other (See Comments)    Severe vaginal itching  . Tranxene [Clorazepate] Itching    Patient Measurements: Height: 5\' 7"  (170.2 cm) Weight: 200 lb 1.6 oz (90.765 kg) IBW/kg (Calculated) : 61.6 Heparin Dosing Weight: 81.5 kg  Vital Signs: Temp: 98.6 F (37 C) (05/28 1300) Temp Source: Oral (05/28 1300) BP: 143/84 mmHg (05/28 1730) Pulse Rate: 101 (05/28 1300)  Labs:  Recent Labs  05/29/14 1310 05/29/14 1311 05/30/14 0638 05/31/14 0608 05/31/14 1623  HGB 16.1*  --  14.5 13.0  --   HCT 49.1*  --  44.9 40.3  --   PLT 304  --  271 256  --   LABPROT  --  29.4* 30.1* 19.4*  --   INR  --  2.85* 2.94* 1.63*  --   HEPARINUNFRC  --   --   --   --  1.16*  CREATININE 1.14*  --  0.98 0.70  --     Estimated Creatinine Clearance: 72.5 mL/min (by C-G formula based on Cr of 0.7).   Medical History: Past Medical History  Diagnosis Date  . Thyroid disease   . DVT (deep venous thrombosis)   . Acute massive pulmonary embolism 07/13/2012    Massive PE w/ PEA arrest 07/13/12 >TNK >IVC filter >discharged on comadin    . Cardiogenic shock 07/13/2012  . Hemorrhagic shock 07/15/2012  . Acute renal failure 07/14/2012  . Acute respiratory failure with hypoxia 07/14/2012  . Hyperlipemia 02/25/2013  . Hypertension 06/06/2013  . Diabetes mellitus with neurological manifestations, controlled 06/06/2013    Medications:  Prescriptions prior to admission  Medication Sig Dispense Refill Last Dose  . glipiZIDE (GLUCOTROL) 5 MG tablet Take 1 tablet (5 mg total) by mouth daily before breakfast. 90 tablet 3 05/28/2014 at Unknown time  . levothyroxine (SYNTHROID, LEVOTHROID) 25 MCG tablet Take 1 tablet by mouth  daily before breakfast 90 tablet 3 05/28/2014 at Unknown time  . metFORMIN (GLUCOPHAGE) 1000 MG tablet 1000mg  (1  tablet) with breakfast and 1000mg  (1 tablet) with dinner. (Patient taking differently: Take 1,000 mg by mouth 2 (two) times daily with a meal. 1000mg  (1 tablet) with breakfast and 1000mg  (1 tablet) with dinner.) 180 tablet 3 05/28/2014 at Unknown time  . metoprolol tartrate (LOPRESSOR) 25 MG tablet Take 1 tablet (25 mg total) by mouth 2 (two) times daily. 180 tablet 3 05/28/2014 at 0800  . ondansetron (ZOFRAN ODT) 4 MG disintegrating tablet Take 1 tablet (4 mg total) by mouth every 8 (eight) hours as needed for nausea or vomiting. 20 tablet 0 Past Week at Unknown time  . oxyCODONE-acetaminophen (PERCOCET/ROXICET) 5-325 MG per tablet Take 1 tablet by mouth every 6 (six) hours as needed. (Patient taking differently: Take 1 tablet by mouth every 6 (six) hours as needed for moderate pain. ) 15 tablet 0 05/28/2014 at Unknown time  . simvastatin (ZOCOR) 20 MG tablet Take 1 tablet (20 mg total) by mouth at bedtime. 90 tablet 3 05/28/2014 at Unknown time  . vitamin C (ASCORBIC ACID) 500 MG tablet Take 500 mg by mouth daily.    05/28/2014 at Unknown time  . warfarin (COUMADIN) 5 MG tablet Take 2.5-5 mg by mouth See admin instructions. Take 1 tablet all days except on Tues and Sat take 1/2 tablet   Past Week at Unknown time  . glucose blood (ONETOUCH VERIO) test strip  Test daily. 100 each 12 unknown    Assessment: 74 yo lady on coumadin for h/o PE, DVT to start heparin when INR <2.0.   She had a colonoscopy which showed stricture.  She will undergo colon resection and probable colostomy Tues or Wednesday..  INR now less than 2 at 1.63, heparin started with 4000 unit bolus at 0800 am  and drip at 1450 units/hr at 0900 am.  HL drawn at 1623 is higher than goal at 1.16. CBC stable with no bleeding reported.   Goal of Therapy:  Heparin level 0.3-0.7 units/ml Monitor platelets by anticoagulation protocol: Yes   Plan:  -hold heparin x 1 hour -decrease rate to 1250 units/hr, then check 8 hr HL Daily HL/CBC Monitor  s/sx of bleeding  Herby Abraham, Pharm.D. [phone] 05/31/2014 7:34 PM

## 2014-05-31 NOTE — Progress Notes (Signed)
1 Day Post-Op  Subjective: No complaints feels OK.  Tolerating clears now on TNA and Heparin.  Objective: Vital signs in last 24 hours: Temp:  [98.4 F (36.9 C)-98.6 F (37 C)] 98.4 F (36.9 C) (05/28 0500) Pulse Rate:  [85-98] 98 (05/28 0500) Resp:  [18-19] 19 (05/28 0500) BP: (149-157)/(73-83) 150/77 mmHg (05/28 0500) SpO2:  [97 %-98 %] 98 % (05/28 0500) Last BM Date: 05/21/14 600 Po  Diet:  clears Afebrile, VSS K+ 3.0 CBC OK INR down to 1.63 TNA    Intake/Output from previous day: 05/27 0701 - 05/28 0700 In: 3114.6 [P.O.:600; I.V.:2456.6; TPN:58] Out: 900 [Urine:900] Intake/Output this shift: Total I/O In: 200 [P.O.:200] Out: 350 [Urine:350]  General appearance: alert, cooperative and no distress GI: soft, non-tender; bowel sounds normal; no masses,  no organomegaly  Lab Results:   Recent Labs  05/30/14 0638 05/31/14 0608  WBC 5.2 5.6  HGB 14.5 13.0  HCT 44.9 40.3  PLT 271 256    BMET  Recent Labs  05/30/14 0638 05/31/14 0608  NA 139 135  K 3.6 3.0*  CL 107 102  CO2 19* 26  GLUCOSE 170* 179*  BUN 10 8  CREATININE 0.98 0.70  CALCIUM 8.4* 8.0*   PT/INR  Recent Labs  05/30/14 0638 05/31/14 0608  LABPROT 30.1* 19.4*  INR 2.94* 1.63*     Recent Labs Lab 05/25/14 0550 05/29/14 1310 05/30/14 0638 05/31/14 0608  AST 18 16 14* 12*  ALT 12* 14 10* 9*  ALKPHOS 45 51 41 37*  BILITOT 0.9 0.9 0.8 0.8  PROT 7.1 7.2 6.1* 5.0*  ALBUMIN 3.4* 3.4* 2.9* 2.6*     Lipase     Component Value Date/Time   LIPASE 12* 05/29/2014 1310     Studies/Results: No results found.  Medications: . antiseptic oral rinse  7 mL Mouth Rinse q12n4p  . chlorhexidine  15 mL Mouth Rinse BID  . insulin aspart  0-15 Units Subcutaneous 6 times per day  . levothyroxine  12.5 mcg Intravenous Daily  . magnesium sulfate 1 - 4 g bolus IVPB  4 g Intravenous Once  . metoprolol  5 mg Intravenous 4 times per day  . potassium chloride  10 mEq Intravenous Q1 Hr x 3  .  potassium phosphate IVPB (mmol)  20 mmol Intravenous Once    Assessment/Plan Sigmoid stricture with obstruction Hx of PE.PEA arrest, Shock, acute respiratory failure and acute renal failure; IVC placement and chronic coumadin 07/2012 Chronic anticoagulation: INR 2.94 today  Hypertension AODM Hypothyroid Hx of DVT back to 2010, perhaps longer Hx of tobacco use, 30 years, quit 1990 Hypokalemia Malnutrition now on TNA   Plan:    No BM, on Clears, TNA and now on heparin.  Dr. Benjamine Mola is getting a 2D echo and we will plan surgery next week.     LOS: 2 days    Hannah [name] 05/31/2014

## 2014-06-01 LAB — BASIC METABOLIC PANEL
ANION GAP: 11 (ref 5–15)
BUN: 10 mg/dL (ref 6–20)
CO2: 25 mmol/L (ref 22–32)
CREATININE: 0.72 mg/dL (ref 0.44–1.00)
Calcium: 8.1 mg/dL — ABNORMAL LOW (ref 8.9–10.3)
Chloride: 96 mmol/L — ABNORMAL LOW (ref 101–111)
GFR calc non Af Amer: >60 mL/min (ref >60)
GLUCOSE: 215 mg/dL — AB (ref 65–99)
Potassium: 3.2 mmol/L — ABNORMAL LOW (ref 3.5–5.1)
Sodium: 132 mmol/L — ABNORMAL LOW (ref 135–145)

## 2014-06-01 LAB — CBC
HCT: 41.3 % (ref 36.0–46.0)
Hemoglobin: 13.8 g/dL (ref 12.0–15.0)
MCH: 28.7 pg (ref 26.0–34.0)
MCHC: 33.4 g/dL (ref 30.0–36.0)
MCV: 85.9 fL (ref 78.0–100.0)
Platelets: 272 10*3/uL (ref 150–400)
RBC: 4.81 MIL/uL (ref 3.87–5.11)
RDW: 14.4 % (ref 11.5–15.5)
WBC: 7.9 10*3/uL (ref 4.0–10.5)

## 2014-06-01 LAB — GLUCOSE, CAPILLARY
GLUCOSE-CAPILLARY: 198 mg/dL — AB (ref 65–99)
Glucose-Capillary: 172 mg/dL — ABNORMAL HIGH (ref 65–99)
Glucose-Capillary: 186 mg/dL — ABNORMAL HIGH (ref 65–99)
Glucose-Capillary: 187 mg/dL — ABNORMAL HIGH (ref 65–99)
Glucose-Capillary: 192 mg/dL — ABNORMAL HIGH (ref 65–99)
Glucose-Capillary: 204 mg/dL — ABNORMAL HIGH (ref 65–99)

## 2014-06-01 LAB — PROTIME-INR
INR: 1.25 (ref 0.00–1.49)
PROTHROMBIN TIME: 15.8 s — AB (ref 11.6–15.2)

## 2014-06-01 LAB — MAGNESIUM: Magnesium: 1.7 mg/dL (ref 1.7–2.4)

## 2014-06-01 LAB — PHOSPHORUS: Phosphorus: 3.2 mg/dL (ref 2.5–4.6)

## 2014-06-01 LAB — HEPARIN LEVEL (UNFRACTIONATED): HEPARIN UNFRACTIONATED: 0.64 [IU]/mL (ref 0.30–0.70)

## 2014-06-01 MED ORDER — POTASSIUM CHLORIDE 10 MEQ/50ML IV SOLN
10.0000 meq | INTRAVENOUS | Status: AC
  Administered 2014-06-01 (×4): 10 meq via INTRAVENOUS
  Filled 2014-06-01 (×4): qty 50

## 2014-06-01 MED ORDER — FAT EMULSION 20 % IV EMUL
240.0000 mL | INTRAVENOUS | Status: AC
  Administered 2014-06-01: 240 mL via INTRAVENOUS
  Filled 2014-06-01: qty 250

## 2014-06-01 MED ORDER — TRACE MINERALS CR-CU-F-FE-I-MN-MO-SE-ZN IV SOLN
INTRAVENOUS | Status: AC
  Administered 2014-06-01: 17:00:00 via INTRAVENOUS
  Filled 2014-06-01: qty 1680

## 2014-06-01 NOTE — Progress Notes (Signed)
2 Days Post-Op  Subjective: She had some nausea earlier, but better now. No real complaints, awaiting surgery.  Objective: Vital signs in last 24 hours: Temp:  [98.3 F (36.8 C)-98.7 F (37.1 C)] 98.3 F (36.8 C) (05/29 0548) Pulse Rate:  [96-101] 96 (05/28 2153) Resp:  [18-20] 20 (05/29 0548) BP: (143-165)/(83-107) 149/87 mmHg (05/29 0548) SpO2:  [98 %-100 %] 98 % (05/29 0548) Last BM Date: 05/22/14 PO 300  Clear diet No BM TNA Afebrile, VSS BP up some. K+ 3.2, mag 1.2 yesterday  ( she go 4 gm Mag yesterday) INR 1.25, on heparin Intake/Output from previous day: 05/28 0701 - 05/29 0700 In: 1520.3 [P.O.:300; I.V.:611.6; IV Piggyback:150; TPN:458.7] Out: 3650 [Urine:3650] Intake/Output this shift:    General appearance: alert, cooperative and no distress GI: soft nontender, few BS.  Lab Results:   Recent Labs  05/31/14 0608 06/01/14 0525  WBC 5.6 7.9  HGB 13.0 13.8  HCT 40.3 41.3  PLT 256 272    BMET  Recent Labs  05/31/14 0608 06/01/14 0525  NA 135 132*  K 3.0* 3.2*  CL 102 96*  CO2 26 25  GLUCOSE 179* 215*  BUN 8 10  CREATININE 0.70 0.72  CALCIUM 8.0* 8.1*   PT/INR  Recent Labs  05/31/14 0608 06/01/14 0525  LABPROT 19.4* 15.8*  INR 1.63* 1.25     Recent Labs Lab 05/29/14 1310 05/30/14 0638 05/31/14 0608  AST 16 14* 12*  ALT 14 10* 9*  ALKPHOS 51 41 37*  BILITOT 0.9 0.8 0.8  PROT 7.2 6.1* 5.0*  ALBUMIN 3.4* 2.9* 2.6*     Lipase     Component Value Date/Time   LIPASE 12* 05/29/2014 1310     Studies/Results: No results found.  Medications: . antiseptic oral rinse  7 mL Mouth Rinse q12n4p  . chlorhexidine  15 mL Mouth Rinse BID  . insulin aspart  0-15 Units Subcutaneous 6 times per day  . levothyroxine  12.5 mcg Intravenous Daily  . metoprolol  5 mg Intravenous 4 times per day    Assessment/Plan Sigmoid stricture with obstruction (Path still pending) Hx of PE/PEA arrest, Shock, acute respiratory failure and acute renal  failure; IVC placement and chronic coumadin 07/2012 Chronic anticoagulation: INR 2.94 today  Hypertension AODM Hypothyroid Hx of DVT back to 2010, perhaps longer Hx of tobacco use, 30 years, quit 1990 Hypokalemia Malnutrition now on TNA    Plan:   surgery early next week. Hypokalemia being treated by Medicine.   LOS: 3 days    Elman Dettman 06/01/2014

## 2014-06-01 NOTE — Progress Notes (Signed)
ANTICOAGULATION CONSULT NOTE - Follow-Up Consult  Pharmacy Consult for heparin Indication: pulmonary embolus  Allergies  Allergen Reactions  . Augmentin [Amoxicillin-Pot Clavulanate] Other (See Comments)    Severe vaginal itching  . Tranxene [Clorazepate] Itching    Patient Measurements: Height: 5\' 7"  (170.2 cm) Weight: 200 lb 1.6 oz (90.765 kg) IBW/kg (Calculated) : 61.6 Heparin Dosing Weight: 81.5 kg  Vital Signs: Temp: 98.3 F (36.8 C) (05/29 1159) Temp Source: Oral (05/29 1159) BP: 159/75 mmHg (05/29 1159) Pulse Rate: 81 (05/29 1159)  Labs:  Recent Labs  05/30/14 6256 05/31/14 0608 05/31/14 1623 06/01/14 0525  HGB 14.5 13.0  --  13.8  HCT 44.9 40.3  --  41.3  PLT 271 256  --  272  LABPROT 30.1* 19.4*  --  15.8*  INR 2.94* 1.63*  --  1.25  HEPARINUNFRC  --   --  1.16* 0.64  CREATININE 0.98 0.70  --  0.72    Estimated Creatinine Clearance: 72.5 mL/min (by C-G formula based on Cr of 0.72).   Medical History: Past Medical History  Diagnosis Date  . Thyroid disease   . DVT (deep venous thrombosis)   . Acute massive pulmonary embolism 07/13/2012    Massive PE w/ PEA arrest 07/13/12 >TNK >IVC filter >discharged on comadin    . Cardiogenic shock 07/13/2012  . Hemorrhagic shock 07/15/2012  . Acute renal failure 07/14/2012  . Acute respiratory failure with hypoxia 07/14/2012  . Hyperlipemia 02/25/2013  . Hypertension 06/06/2013  . Diabetes mellitus with neurological manifestations, controlled 06/06/2013     Assessment: 74 yo lady on coumadin for h/o PE, DVT on heparin bridge therapy.   She had a colonoscopy which showed stricture.  She will undergo colon resection and probable colostomy Tues or Wednesday.Marland Kitchen  HL therapeutic at 0.64 on heparin drip at 1250 units/hr.  INR 1.25. CBC stable with no bleeding reported.   Goal of Therapy:  Heparin level 0.3-0.7 units/ml Monitor platelets by anticoagulation protocol: Yes   Plan:   continue heparin drip at 1250  units/hr Daily HL/CBC Monitor s/sx of bleeding  Herby Abraham, Pharm.D. [phone] 06/01/2014 2:07 PM

## 2014-06-01 NOTE — Progress Notes (Signed)
PARENTERAL NUTRITION CONSULT NOTE - INITIAL  Pharmacy Consult for TPN Indication: Colonic stricture  Allergies  Allergen Reactions  . Augmentin [Amoxicillin-Pot Clavulanate] Other (See Comments)    Severe vaginal itching  . Tranxene [Clorazepate] Itching    Patient Measurements: Height: '5\' 7"'  (170.2 cm) Weight: 200 lb 1.6 oz (90.765 kg) IBW/kg (Calculated) : 61.6    Vital Signs: Temp: 98.3 F (36.8 C) (05/29 0548) Temp Source: Oral (05/29 0548) BP: 149/87 mmHg (05/29 0548) Pulse Rate: 96 (05/28 2153) Intake/Output from previous day: 05/28 0701 - 05/29 0700 In: 1520.3 [P.O.:300; I.V.:611.6; IV Piggyback:150; TPN:458.7] Out: 3650 [Urine:3650] Intake/Output from this shift:    Labs:  Recent Labs  05/30/14 0638 05/31/14 0608 06/01/14 0525  WBC 5.2 5.6 7.9  HGB 14.5 13.0 13.8  HCT 44.9 40.3 41.3  PLT 271 256 272  INR 2.94* 1.63* 1.25     Recent Labs  05/29/14 1310 05/30/14 0638 05/30/14 1250 05/31/14 0608 06/01/14 0525  NA 137 139  --  135 132*  K 3.7 3.6  --  3.0* 3.2*  CL 102 107  --  102 96*  CO2 17* 19*  --  26 25  GLUCOSE 289* 170*  --  179* 215*  BUN 10 10  --  8 10  CREATININE 1.14* 0.98  --  0.70 0.72  CALCIUM 8.9 8.4*  --  8.0* 8.1*  MG  --   --   --  1.2* 1.7  PHOS  --   --   --  2.1* 3.2  PROT 7.2 6.1*  --  5.0*  --   ALBUMIN 3.4* 2.9*  --  2.6*  --   AST 16 14*  --  12*  --   ALT 14 10*  --  9*  --   ALKPHOS 51 41  --  37*  --   BILITOT 0.9 0.8  --  0.8  --   PREALBUMIN  --   --  11.8*  --   --   TRIG  --   --   --  91  --    Estimated Creatinine Clearance: 72.5 mL/min (by C-G formula based on Cr of 0.72).    Recent Labs  06/01/14 0002 06/01/14 0406 06/01/14 0845  GLUCAP 192* 198* 187*    Insulin Requirements in the past 24 hours:  14 units novolog, 15 units regular insulin in TPN  Current Nutrition:  Clear liquids, Clinimix E 57m/hr and IVFE at 13mhr- 1673 KCal, 84g protein  Assessment: [age]ear old woman found to  have a colonic stricture on colonoscopy 5/27.  Will need a hartman's procedure at some point, probably next week.    Nutritional Goals:  1850-2050 Kcal, 85-95g protein  Surgeries/Procedures:   5/27: colonoscopy - colonic stricture found  GI: Colonic stricture - will need surgery at some point..  Prealbumin 11.8 at [place]ilne. TG 91.  Endo: On metformin and glipizide PTA.  CBG 171-226  Synthroid 25 mcg pta  Lytes: K 3.2,, Na 132, Mg 1.7, Phos 3.2  Renal: Scr 0.7 today  Pulm: 98% on RA  On warfarin PTA for a history of PE  Cards: History of HTN and HLD - Lopressor, simvastatin PTA.  150/77, HR 19.  PRN hydralazine, scheduled lopressor  Hepatobil: AST and ALT below normal range, alk phos normal, tbili 0.8.  Neuro: OK  ID:   Not infected at this time  Best Practices:heparin infusion  TPN Access: PICC line  placed 5/27  TPN start date: 5/27  Plan:  Continue Clinimix E at 62m/hr and IVFE at 165mhr - also add thiamine, folic acid, and increase insulin to 30 units in TPN.  This will provide 1673 KCAL and 84g protein.  Advance to goal as tolerated. Continue moderately sensitive SSI KCL 1045mx 4 TPN labs in AM   FreCandie Mile29/2016,9:39 AM

## 2014-06-01 NOTE — Progress Notes (Signed)
PROGRESS NOTE  Hannah [name] WUJ:[id number] DOB: 1940/07/07 DOA: 05/29/2014 PCP: [name]., DO  Assessment/Plan: Colonic stricture -admit to medical floor -GI consulted; s/p colonoscopy- showed stricture so surgical consult- plan for surgery early next week -discussed with surgery- TNA-- some nausea with clears so will back up to just ice chips -supportive care w/antiemetics -EKG similar to prvious- had ECHO in 11/15: seen by cardiology 2 years ago after PE and was told she did not have to follow up; able to clean her house with out chest pain or SOB   Dehydration/Acute renal failure -IVFs  Follow labs   Hypothyroid -hold Synthroid PO while NPO-convert to IV dosing   Hypertension -hold PO meds for now -on BB so provide scheduled IV dosing -PRN hydralazine   Diabetes mellitus with neurological manifestations, controlled -hold Glucotrol -SSI q 4 while NPO   Long term current use of anticoagulant therapy/History of pulmonary embolism -heparin gtt  Hypokalemia -replete  Code Status: full Family Communication: daughter at bedside 5/27 Disposition Plan:    Consultants:  GI  surgery  Procedures:  sigmoidoscopy  Antibiotics:    HPI/Subjective: C/o nausea, no flatus and no BMs  Objective: Filed Vitals:   06/01/14 0548  BP: 149/87  Pulse:   Temp: 98.3 F (36.8 C)  Resp: 20    Intake/Output Summary (Last 24 hours) at 06/01/14 1104 Last data filed at 06/01/14 1030  Gross per 24 hour  Intake 1320.26 ml  Output   4100 ml  Net -2779.74 ml   Filed Weights   05/29/14 1733  Weight: 90.765 kg (200 lb 1.6 oz)    Exam:   General:  Pleasant/cooperative  Cardiovascular: rrr  Respiratory: clear  Abdomen: +BS, soft  Musculoskeletal: no edema   Data Reviewed: Basic Metabolic Panel:  Recent Labs Lab 05/29/14 1310 05/30/14 0638 05/31/14 0608 06/01/14 0525  NA 137 139 135 132*  K 3.7 3.6 3.0* 3.2*  CL 102 107 102 96*  CO2 17* 19* 26  25  GLUCOSE 289* 170* 179* 215*  BUN CREATININE 1.14* 0.98 0.70 0.72  CALCIUM 8.9 8.4* 8.0* 8.1*  MG  --   --  1.2* 1.7  PHOS  --   --  2.1* 3.2   Liver Function Tests:  Recent Labs Lab 05/29/14 1310 05/30/14 0638 05/31/14 0608  AST 16 14* 12*  ALT 14 10* 9*  ALKPHOS 51 41 37*  BILITOT 0.9 0.8 0.8  PROT 7.2 6.1* 5.0*  ALBUMIN 3.4* 2.9* 2.6*    Recent Labs Lab 05/29/14 1310  LIPASE 12*   No results for input(s): AMMONIA in the last 168 hours. CBC:  Recent Labs Lab 05/29/14 1310 05/30/14 0638 05/31/14 0608 06/01/14 0525  WBC 6.3 5.2 5.6 7.9  NEUTROABS 4.4  --  2.4  --   HGB 16.1* 14.5 13.0 13.8  HCT 49.1* 44.9 40.3 41.3  MCV 85.8 87.2 86.9 85.9  PLT 304 271 256 272   Cardiac Enzymes: No results for input(s): CKTOTAL, CKMB, CKMBINDEX, TROPONINI in the last 168 hours. BNP (last 3 results) No results for input(s): BNP in the last 8760 hours.  ProBNP (last 3 results) No results for input(s): PROBNP in the last 8760 hours.  CBG:  Recent Labs Lab 05/31/14 1647 05/31/14 2032 06/01/14 0002 06/01/14 0406 06/01/14 0845  GLUCAP 171* 226* 192* 198* 187*    No results found for this or any previous visit (from the past 240 hour(s)).   Studies: No results found.  Scheduled Meds: . antiseptic oral rinse  7 mL Mouth Rinse q12n4p  . chlorhexidine  15 mL Mouth Rinse BID  . insulin aspart  0-15 Units Subcutaneous 6 times per day  . levothyroxine  12.5 mcg Intravenous Daily  . metoprolol  5 mg Intravenous 4 times per day  . potassium chloride  10 mEq Intravenous Q1 Hr x 4   Continuous Infusions: . sodium chloride 300 mL (05/31/14 1745)  . Marland KitchenTPN (CLINIMIX-E) Adult 70 mL/hr at 05/31/14 1815   And  . fat emulsion 240 mL (05/31/14 1815)  . Marland KitchenTPN (CLINIMIX-E) Adult     And  . fat emulsion    . heparin 1,250 Units/hr (06/01/14 0106)   Antibiotics Given (last 72 hours)    None      Principal Problem:   Colonic stricture Active Problems:    Long term current use of anticoagulant therapy   Hypothyroid   Hypertension   Diabetes mellitus with neurological manifestations, controlled   History of pulmonary embolism   Dehydration   Acute renal failure    Time spent: 25 min    Raeli Wiens  [facility] Pager [phone]. If 7PM-7AM, please contact night-coverage at [email or web address], password [facility] 06/01/2014, 11:03 AM  LOS: 3 days

## 2014-06-02 LAB — DIFFERENTIAL
BASOS PCT: 0 % (ref 0–1)
Basophils Absolute: 0 10*3/uL (ref 0.0–0.1)
Eosinophils Absolute: 0.1 10*3/uL (ref 0.0–0.7)
Eosinophils Relative: 1 % (ref 0–5)
LYMPHS ABS: 3 10*3/uL (ref 0.7–4.0)
Lymphocytes Relative: 32 % (ref 12–46)
Monocytes Absolute: 1 10*3/uL (ref 0.1–1.0)
Monocytes Relative: 11 % (ref 3–12)
NEUTROS ABS: 5.1 10*3/uL (ref 1.7–7.7)
Neutrophils Relative %: 56 % (ref 43–77)

## 2014-06-02 LAB — SURGICAL PCR SCREEN
MRSA, PCR: NEGATIVE
STAPHYLOCOCCUS AUREUS: NEGATIVE

## 2014-06-02 LAB — GLUCOSE, CAPILLARY
GLUCOSE-CAPILLARY: 182 mg/dL — AB (ref 65–99)
GLUCOSE-CAPILLARY: 185 mg/dL — AB (ref 65–99)
Glucose-Capillary: 217 mg/dL — ABNORMAL HIGH (ref 65–99)
Glucose-Capillary: 192 mg/dL — ABNORMAL HIGH (ref 65–99)
Glucose-Capillary: 182 mg/dL — ABNORMAL HIGH (ref 65–99)
Glucose-Capillary: 150 mg/dL — ABNORMAL HIGH (ref 65–99)

## 2014-06-02 LAB — COMPREHENSIVE METABOLIC PANEL
ALT: 12 U/L — ABNORMAL LOW (ref 14–54)
AST: 14 U/L — ABNORMAL LOW (ref 15–41)
Albumin: 2.8 g/dL — ABNORMAL LOW (ref 3.5–5.0)
Alkaline Phosphatase: 44 U/L (ref 38–126)
Anion gap: 9 (ref 5–15)
BUN: 16 mg/dL (ref 6–20)
CALCIUM: 8.6 mg/dL — AB (ref 8.9–10.3)
CHLORIDE: 99 mmol/L — AB (ref 101–111)
CO2: 27 mmol/L (ref 22–32)
CREATININE: 0.7 mg/dL (ref 0.44–1.00)
GFR calc Af Amer: >60 mL/min (ref >60)
GFR calc non Af Amer: >60 mL/min (ref >60)
Glucose, Bld: 172 mg/dL — ABNORMAL HIGH (ref 65–99)
POTASSIUM: 3.5 mmol/L (ref 3.5–5.1)
Sodium: 135 mmol/L (ref 135–145)
TOTAL PROTEIN: 5.8 g/dL — AB (ref 6.5–8.1)
Total Bilirubin: 0.6 mg/dL (ref 0.3–1.2)

## 2014-06-02 LAB — CBC
HEMATOCRIT: 42.7 % (ref 36.0–46.0)
HEMOGLOBIN: 13.9 g/dL (ref 12.0–15.0)
MCH: 28 pg (ref 26.0–34.0)
MCHC: 32.6 g/dL (ref 30.0–36.0)
MCV: 85.9 fL (ref 78.0–100.0)
Platelets: 292 10*3/uL (ref 150–400)
RBC: 4.97 MIL/uL (ref 3.87–5.11)
RDW: 14.5 % (ref 11.5–15.5)
WBC: 9.3 10*3/uL (ref 4.0–10.5)

## 2014-06-02 LAB — MAGNESIUM: Magnesium: 1.6 mg/dL — ABNORMAL LOW (ref 1.7–2.4)

## 2014-06-02 LAB — PHOSPHORUS: Phosphorus: 3.1 mg/dL (ref 2.5–4.6)

## 2014-06-02 LAB — HEPARIN LEVEL (UNFRACTIONATED)
HEPARIN UNFRACTIONATED: 0.85 [IU]/mL — AB (ref 0.30–0.70)
Heparin Unfractionated: 0.33 IU/mL (ref 0.30–0.70)

## 2014-06-02 LAB — PROTIME-INR
INR: 1.14 (ref 0.00–1.49)
Prothrombin Time: 14.7 seconds (ref 11.6–15.2)

## 2014-06-02 LAB — TRIGLYCERIDES: Triglycerides: 97 mg/dL (ref <150)

## 2014-06-02 LAB — PREALBUMIN: PREALBUMIN: 13 mg/dL — AB (ref 18–38)

## 2014-06-02 MED ORDER — CHLORHEXIDINE GLUCONATE CLOTH 2 % EX PADS
6.0000 | MEDICATED_PAD | Freq: Once | CUTANEOUS | Status: AC
  Administered 2014-06-03: 6 via TOPICAL

## 2014-06-02 MED ORDER — GENTAMICIN IN SALINE 1-0.9 MG/ML-% IV SOLN
100.0000 mg | INTRAVENOUS | Status: AC
  Administered 2014-06-03: 100 mg via INTRAVENOUS
  Filled 2014-06-02: qty 100

## 2014-06-02 MED ORDER — FAT EMULSION 20 % IV EMUL
240.0000 mL | INTRAVENOUS | Status: AC
  Administered 2014-06-02: 240 mL via INTRAVENOUS
  Administered 2014-06-03: 10 mL via INTRAVENOUS
  Filled 2014-06-02: qty 250

## 2014-06-02 MED ORDER — CLINDAMYCIN PHOSPHATE 900 MG/50ML IV SOLN
900.0000 mg | INTRAVENOUS | Status: AC
  Administered 2014-06-03: 900 mg via INTRAVENOUS
  Filled 2014-06-02: qty 50

## 2014-06-02 MED ORDER — HEPARIN (PORCINE) IN NACL 100-0.45 UNIT/ML-% IJ SOLN
1150.0000 [IU]/h | INTRAMUSCULAR | Status: AC
  Filled 2014-06-02: qty 250

## 2014-06-02 MED ORDER — CLINDAMYCIN PHOSPHATE 900 MG/50ML IV SOLN: 900.0000 mg | INTRAVENOUS | Status: DC

## 2014-06-02 MED ORDER — CHLORHEXIDINE GLUCONATE CLOTH 2 % EX PADS
6.0000 | MEDICATED_PAD | Freq: Once | CUTANEOUS | Status: AC
  Administered 2014-06-02: 6 via TOPICAL

## 2014-06-02 MED ORDER — ALVIMOPAN 12 MG PO CAPS
12.0000 mg | ORAL_CAPSULE | ORAL | Status: AC
  Administered 2014-06-03: 12 mg via ORAL
  Filled 2014-06-02 (×2): qty 1

## 2014-06-02 MED ORDER — ALVIMOPAN 12 MG PO CAPS: 12.0000 mg | ORAL_CAPSULE | Freq: Once | ORAL | Status: DC

## 2014-06-02 MED ORDER — TRACE MINERALS CR-CU-F-FE-I-MN-MO-SE-ZN IV SOLN
INTRAVENOUS | Status: AC
  Administered 2014-06-02: 18:00:00 via INTRAVENOUS
  Filled 2014-06-02: qty 1920

## 2014-06-02 NOTE — Progress Notes (Signed)
PARENTERAL NUTRITION CONSULT NOTE - INITIAL  Pharmacy Consult for TPN Indication: Colonic stricture  Allergies  Allergen Reactions  . Augmentin [Amoxicillin-Pot Clavulanate] Other (See Comments)    Severe vaginal itching  . Tranxene [Clorazepate] Itching    Patient Measurements: Height: '5\' 7"'  (170.2 cm) Weight: 200 lb 1.6 oz (90.765 kg) IBW/kg (Calculated) : 61.6    Vital Signs: Temp: 98.6 F (37 C) (05/30 0540) Temp Source: Oral (05/30 0540) BP: 141/69 mmHg (05/30 0700) Pulse Rate: 87 (05/30 0540) Intake/Output from previous day: 05/29 0701 - 05/30 0700 In: 120 [I.V.:120] Out: 2650 [Urine:2650] Intake/Output from this shift:    Labs:  Recent Labs  05/31/14 0608 06/01/14 0525 06/02/14 0530  WBC 5.6 7.9 9.3  HGB 13.0 13.8 13.9  HCT 40.3 41.3 42.7  PLT 256 272 292  INR 1.63* 1.25 1.14     Recent Labs  05/30/14 1250 05/31/14 0608 06/01/14 0525 06/02/14 0530  NA  --  135 132*  --   K  --  3.0* 3.2*  --   CL  --  102 96*  --   CO2  --  26 25  --   GLUCOSE  --  179* 215*  --   BUN  --  8 10  --   CREATININE  --  0.70 0.72  --   CALCIUM  --  8.0* 8.1*  --   MG  --  1.2* 1.7  --   PHOS  --  2.1* 3.2  --   PROT  --  5.0*  --   --   ALBUMIN  --  2.6*  --   --   AST  --  12*  --   --   ALT  --  9*  --   --   ALKPHOS  --  37*  --   --   BILITOT  --  0.8  --   --   PREALBUMIN 11.8*  --   --  13.0*  TRIG  --  91  --   --    Estimated Creatinine Clearance: 72.5 mL/min (by C-G formula based on Cr of 0.72).    Recent Labs  06/02/14 0009 06/02/14 0407 06/02/14 0753  GLUCAP 182* 217* 192*    Insulin Requirements in the past 24 hours:  14 units novolog, 15 units regular insulin in TPN  Current Nutrition:  Clear liquids, Clinimix E 58m/hr and IVFE at 156mhr- 1673 KCal, 84g protein  Assessment: [age]ear old woman found to have a colonic stricture with obstruction on colonoscopy 5/27.  Will need a Hartman's procedure when INR down.  Nutritional  Goals:  1850-2050 Kcal, 85-95g protein (5/27)  Surgeries/Procedures:   5/27: colonoscopy - colonic stricture found  GI: Colonic stricture; will need surgery.  Prealbumin 11.8 (baseline)>>13. TG 91. Plan surgery 6/1 for partial colectomy/colostomy.  Endo: On metformin and glipizide PTA.  CBG 182-217  Synthroid 25 mcg pta  Lytes: K 3.2,, Na 132, Mg 1.7, Phos 3.2--not done yet for 5/30  Renal: Scr 0.72   Pulm: 98% on RA  On warfarin PTA for a history of PE  Cards: History of HTN and HLD - Lopressor, simvastatin PTA. VSS. PRN hydralazine, scheduled lopressor IV  Hepatobil: AST and ALT below normal range, alk phos normal, tbili 0.8.  Neuro: OK  ID:  NA  Anticoag: h/o DVT/PE: INR 2.94.  Best Practices: heparin infusion  TPN Access: PICC line  placed 5/27  TPN start date: 5/27 (day 0)  Plan:  -  Increase Clinimix E to 77m/hr and IVFE at 123mhr - also add thiamine, folic acid, and increase insulin to 45 units in TPN.  This will provide 1843 kcal and 96g protein at goal. - Continue moderately sensitive SSI - f/u labs in AM - Surgery 5/31   Stanislav Gervase S. RoAlford HighlandPharmD, BCCommunity Memorial Hospitallinical Staff Pharmacist Pager [phone] RoEilene Ghazitillinger 06/02/2014,9:48 AM

## 2014-06-02 NOTE — Progress Notes (Signed)
3 Days Post-Op  Subjective: Plan on surgery tomorrow.  Family has connections at [facility] and would like info shared with them.  I explained we would most likely do a partial colectomy,and colostomy, since we cannot prep the colon, she says she had some pudding like stool come thru yesterday.  I talked to the son and daughter along with the patient.    Objective: Vital signs in last 24 hours: Temp:  [98.3 F (36.8 C)-98.9 F (37.2 C)] 98.6 F (37 C) (05/30 0540) Pulse Rate:  [81-97] 87 (05/30 0540) Resp:  [18-20] 20 (05/30 0540) BP: (141-170)/(69-110) 141/69 mmHg (05/30 0700) SpO2:  [97 %-99 %] 99 % (05/30 0540) Last BM Date: 06/01/14 Nothing PO recorded  1 stool recorded BP up some yesterday Prealbumin 13 this AM CBC OK CMP pending Intake/Output from previous day: 05/29 0701 - 05/30 0700 In: 120 [I.V.:120] Out: 2650 [Urine:2650] Intake/Output this shift:    General appearance: alert, cooperative and no distress GI: soft, mildly distended, but not much.  No pain.  Lab Results:   Recent Labs  06/01/14 0525 06/02/14 0530  WBC 7.9 9.3  HGB 13.8 13.9  HCT 41.3 42.7  PLT 272 292    BMET  Recent Labs  05/31/14 0608 06/01/14 0525  NA 135 132*  K 3.0* 3.2*  CL 102 96*  CO2 26 25  GLUCOSE 179* 215*  BUN 8 10  CREATININE 0.70 0.72  CALCIUM 8.0* 8.1*   PT/INR  Recent Labs  06/01/14 0525 06/02/14 0530  LABPROT 15.8* 14.7  INR 1.25 1.14     Recent Labs Lab 05/29/14 1310 05/30/14 0638 05/31/14 0608  AST 16 14* 12*  ALT 14 10* 9*  ALKPHOS 51 41 37*  BILITOT 0.9 0.8 0.8  PROT 7.2 6.1* 5.0*  ALBUMIN 3.4* 2.9* 2.6*     Lipase     Component Value Date/Time   LIPASE 12* 05/29/2014 1310     Studies/Results: No results found.  Medications: . antiseptic oral rinse  7 mL Mouth Rinse q12n4p  . chlorhexidine  15 mL Mouth Rinse BID  . insulin aspart  0-15 Units Subcutaneous 6 times per day  . levothyroxine  12.5 mcg Intravenous Daily  . metoprolol  5 mg  Intravenous 4 times per day    Assessment/Plan Sigmoid stricture with obstruction (Path still pending) Hx of PE/PEA arrest, Shock, acute respiratory failure and acute renal failure; IVC placement and chronic coumadin 07/2012 Chronic anticoagulation: INR 2.94 today  Hypertension AODM Hypothyroid Hx of DVT back to 2010, perhaps longer Hx of tobacco use, 30 years, quit 1990 Hypokalemia Malnutrition now on TNA   Plan:  I will get the Wound care to mark, ask pharmacy to stop heparin at 5 AM, permit and get ready for surgery tomorrow.  CMP is pending would like to get her up to K+ 4.0.  Pre op orders written.     LOS: 4 days    Aryka Coonradt 06/02/2014

## 2014-06-02 NOTE — Progress Notes (Addendum)
ANTICOAGULATION CONSULT NOTE - Follow-Up Consult  Pharmacy Consult for heparin Indication: pulmonary embolus  Allergies  Allergen Reactions  . Augmentin [Amoxicillin-Pot Clavulanate] Other (See Comments)    Severe vaginal itching  . Tranxene [Clorazepate] Itching    Patient Measurements: Height: 5\' 7"  (170.2 cm) Weight: 200 lb 1.6 oz (90.765 kg) IBW/kg (Calculated) : 61.6 Heparin Dosing Weight: 81.5 kg  Vital Signs: Temp: 98.6 F (37 C) (05/30 0540) Temp Source: Oral (05/30 0540) BP: 142/94 mmHg (05/30 0540) Pulse Rate: 87 (05/30 0540)  Labs:  Recent Labs  05/31/14 0608 05/31/14 1623 06/01/14 0525 06/02/14 0530  HGB 13.0  --  13.8 13.9  HCT 40.3  --  41.3 42.7  PLT 256  --  272 292  LABPROT 19.4*  --  15.8* 14.7  INR 1.63*  --  1.25 1.14  HEPARINUNFRC  --  1.16* 0.64 0.85*  CREATININE 0.70  --  0.72  --     Estimated Creatinine Clearance: 72.5 mL/min (by C-G formula based on Cr of 0.72).   Medical History: Past Medical History  Diagnosis Date  . Thyroid disease   . DVT (deep venous thrombosis)   . Acute massive pulmonary embolism 07/13/2012    Massive PE w/ PEA arrest 07/13/12 >TNK >IVC filter >discharged on comadin    . Cardiogenic shock 07/13/2012  . Hemorrhagic shock 07/15/2012  . Acute renal failure 07/14/2012  . Acute respiratory failure with hypoxia 07/14/2012  . Hyperlipemia 02/25/2013  . Hypertension 06/06/2013  . Diabetes mellitus with neurological manifestations, controlled 06/06/2013     Assessment: 74 yo lady on coumadin for h/o PE, DVT on heparin bridge therapy.   She had a colonoscopy which showed stricture.  She will undergo colon resection and probable colostomy Tues or Wednesday.Marland Kitchen  HL therapeutic at 0.64 on heparin drip at 1250 units/hr.  INR 1.25. CBC stable with no bleeding reported.  AM HL is elevated at 0.85 on heparin 1250 units/hr. Nurse reports no issues with infusion and bleeding.  Goal of Therapy:  Heparin level 0.3-0.7  units/ml Monitor platelets by anticoagulation protocol: Yes   Plan:  Decrease heparin to 1100 units/hr 8h HL Daily HL/CBC Monitor s/sx of bleeding  Arlean Hopping. Newman Pies, PharmD Clinical Pharmacist Pager [phone]  06/02/2014 6:48 AM     Addendum: Heparin level is now therapeutic at 0.33. Increase heparin rate slightly to 1150 units/hr to try and maintain therapeutic and follow up AM labs.  Louie Casa, PharmD, BCPS 06/02/2014, 6:00 PM

## 2014-06-02 NOTE — Consult Note (Signed)
WOC ostomy consult note Patient in bed.  Marking for possible colostomy tomorrow.  Patient is evaluated, lying, sitting and standing.  Has large pendulous abdomen.  Due to creasing and skin folds, will mark slightly above umbilicus, within rectus muscle.   After evaluation, patient is marked 4 cm left and 2 cm above umbilicus. Within the rectus muscle.  This was in a plain field of vision for the patient.  Education provided:  Patient worked in [employer], [employer] prior to retirement and stated she had patients with ostomies before.  She understands the importance of the best possible site marking to improve wear time and self care.  Written materials are given to patient and she is encouraged to read this information if possible.  Advised that our team will assist her after surgery for ongoing education.    WOC team will continue to follow and remain available to patient, medical and nursing teams.  Maple Hudson RN BSN CWON Pager [phone]

## 2014-06-02 NOTE — Progress Notes (Signed)
PROGRESS NOTE  Hannah [name] ZOX:[id number] DOB: July 06, 1940 DOA: 05/29/2014 PCP: [name]., DO  Assessment/Plan: Colonic stricture -admit to medical floor -GI consulted; s/p colonoscopy- showed stricture so surgical consult- plan for surgery early next week -discussed with surgery- TNA-- some nausea with clears so will back up to just ice chips -supportive care w/antiemetics -EKG similar to prvious- had ECHO in 11/15: seen by cardiology 2 years ago after PE and was told she did not have to follow up; able to clean her house with out chest pain or SOB   Dehydration/Acute renal failure -IVFs  Follow labs   Hypothyroid -hold Synthroid PO while NPO-convert to IV dosing   Hypertension -hold PO meds for now -on BB so provide scheduled IV dosing -PRN hydralazine   Diabetes mellitus with neurological manifestations, controlled -hold Glucotrol -SSI q 4 while NPO   Long term current use of anticoagulant therapy/History of pulmonary embolism -heparin gtt  Hypokalemia -replete to > 4-- await today's labs  Code Status: full Family Communication: daughter at bedside Disposition Plan:    Consultants:  GI  surgery  Procedures:  sigmoidoscopy  Antibiotics:    HPI/Subjective: No SOB, no CP Some nausea this AM  Objective: Filed Vitals:   06/02/14 0700  BP: 141/69  Pulse:   Temp:   Resp:     Intake/Output Summary (Last 24 hours) at 06/02/14 1115 Last data filed at 06/02/14 0543  Gross per 24 hour  Intake    120 ml  Output   1850 ml  Net  -1730 ml   Filed Weights   05/29/14 1733  Weight: 90.765 kg (200 lb 1.6 oz)    Exam:   General:  Pleasant/cooperative  Cardiovascular: rrr  Respiratory: clear  Abdomen: +BS, soft  Musculoskeletal: no edema   Data Reviewed: Basic Metabolic Panel:  Recent Labs Lab 05/29/14 1310 05/30/14 0638 05/31/14 0608 06/01/14 0525  NA 137 139 135 132*  K 3.7 3.6 3.0* 3.2*  CL 102 107 102 96*  CO2 17* 19*  26 25  GLUCOSE 289* 170* 179* 215*  BUN CREATININE 1.14* 0.98 0.70 0.72  CALCIUM 8.9 8.4* 8.0* 8.1*  MG  --   --  1.2* 1.7  PHOS  --   --  2.1* 3.2   Liver Function Tests:  Recent Labs Lab 05/29/14 1310 05/30/14 0638 05/31/14 0608  AST 16 14* 12*  ALT 14 10* 9*  ALKPHOS 51 41 37*  BILITOT 0.9 0.8 0.8  PROT 7.2 6.1* 5.0*  ALBUMIN 3.4* 2.9* 2.6*    Recent Labs Lab 05/29/14 1310  LIPASE 12*   No results for input(s): AMMONIA in the last 168 hours. CBC:  Recent Labs Lab 05/29/14 1310 05/30/14 0638 05/31/14 0608 06/01/14 0525 06/02/14 0530  WBC 6.3 5.2 5.6 7.9 9.3  NEUTROABS 4.4  --  2.4  --  5.1  HGB 16.1* 14.5 13.0 13.8 13.9  HCT 49.1* 44.9 40.3 41.3 42.7  MCV 85.8 87.2 86.9 85.9 85.9  PLT 304 271 256 272 292   Cardiac Enzymes: No results for input(s): CKTOTAL, CKMB, CKMBINDEX, TROPONINI in the last 168 hours. BNP (last 3 results) No results for input(s): BNP in the last 8760 hours.  ProBNP (last 3 results) No results for input(s): PROBNP in the last 8760 hours.  CBG:  Recent Labs Lab 06/01/14 1653 06/01/14 2016 06/02/14 0009 06/02/14 0407 06/02/14 0753  GLUCAP 172* 186* 182* 217* 192*    No results found for this or  any previous visit (from the past 240 hour(s)).   Studies: No results found.  Scheduled Meds: . alvimopan  12 mg Oral Once  . antiseptic oral rinse  7 mL Mouth Rinse q12n4p  . chlorhexidine  15 mL Mouth Rinse BID  . Chlorhexidine Gluconate Cloth  6 each Topical Once   And  . Chlorhexidine Gluconate Cloth  6 each Topical Once  . clindamycin (CLEOCIN) IV  900 mg Intravenous On Call to OR  . gentamicin  100 mg Intravenous On Call to OR  . insulin aspart  0-15 Units Subcutaneous 6 times per day  . levothyroxine  12.5 mcg Intravenous Daily  . metoprolol  5 mg Intravenous 4 times per day   Continuous Infusions: . Marland KitchenTPN (CLINIMIX-E) Adult     And  . fat emulsion    . sodium chloride 300 mL (05/31/14 1745)  . Marland KitchenTPN  (CLINIMIX-E) Adult 70 mL/hr at 06/01/14 1720   And  . fat emulsion 240 mL (06/01/14 1720)  . heparin 1,100 Units/hr (06/02/14 0654)   Antibiotics Given (last 72 hours)    None      Principal Problem:   Colonic stricture Active Problems:   Long term current use of anticoagulant therapy   Hypothyroid   Hypertension   Diabetes mellitus with neurological manifestations, controlled   History of pulmonary embolism   Dehydration   Acute renal failure    Time spent: 25 min    Dae Antonucci  [facility] Pager [phone]. If 7PM-7AM, please contact night-coverage at [email or web address], password [facility] 06/02/2014, 11:15 AM  LOS: 4 days

## 2014-06-03 ENCOUNTER — Encounter (HOSPITAL_COMMUNITY): Payer: Self-pay | Admitting: Gastroenterology

## 2014-06-03 ENCOUNTER — Inpatient Hospital Stay (HOSPITAL_COMMUNITY): Payer: Medicare Other | Admitting: Anesthesiology

## 2014-06-03 ENCOUNTER — Encounter (HOSPITAL_COMMUNITY)
Admission: EM | Disposition: A | Discharge status: Skilled Nursing Facility | Payer: Self-pay | Source: Home / Self Care | Attending: Internal Medicine

## 2014-06-03 HISTORY — PX: COLOSTOMY: SHX63

## 2014-06-03 HISTORY — PX: PARTIAL COLECTOMY: SHX5273

## 2014-06-03 LAB — CBC
HCT: 41.3 % (ref 36.0–46.0)
Hemoglobin: 13.4 g/dL (ref 12.0–15.0)
MCH: 28 pg (ref 26.0–34.0)
MCHC: 32.4 g/dL (ref 30.0–36.0)
MCV: 86.4 fL (ref 78.0–100.0)
PLATELETS: 274 10*3/uL (ref 150–400)
RBC: 4.78 MIL/uL (ref 3.87–5.11)
RDW: 14.5 % (ref 11.5–15.5)
WBC: 11.5 10*3/uL — ABNORMAL HIGH (ref 4.0–10.5)

## 2014-06-03 LAB — GLUCOSE, CAPILLARY
GLUCOSE-CAPILLARY: 183 mg/dL — AB (ref 65–99)
GLUCOSE-CAPILLARY: 189 mg/dL — AB (ref 65–99)
GLUCOSE-CAPILLARY: 200 mg/dL — AB (ref 65–99)
GLUCOSE-CAPILLARY: 232 mg/dL — AB (ref 65–99)
GLUCOSE-CAPILLARY: 317 mg/dL — AB (ref 65–99)
Glucose-Capillary: 175 mg/dL — ABNORMAL HIGH (ref 65–99)
Glucose-Capillary: 329 mg/dL — ABNORMAL HIGH (ref 65–99)

## 2014-06-03 LAB — PROTIME-INR
INR: 1.07 (ref 0.00–1.49)
Prothrombin Time: 14.1 seconds (ref 11.6–15.2)

## 2014-06-03 SURGERY — COLECTOMY, PARTIAL
Anesthesia: General | Site: Abdomen

## 2014-06-03 MED ORDER — ONDANSETRON HCL 4 MG/2ML IJ SOLN
INTRAMUSCULAR | Status: DC | PRN
  Administered 2014-06-03: 4 mg via INTRAVENOUS

## 2014-06-03 MED ORDER — HYDROMORPHONE HCL 1 MG/ML IJ SOLN
INTRAMUSCULAR | Status: AC
  Administered 2014-06-03: 16:00:00
  Filled 2014-06-03: qty 1

## 2014-06-03 MED ORDER — PHENYLEPHRINE HCL 10 MG/ML IJ SOLN
10.0000 mg | INTRAVENOUS | Status: DC | PRN
  Administered 2014-06-03: 20 ug/min via INTRAVENOUS

## 2014-06-03 MED ORDER — PROMETHAZINE HCL 25 MG/ML IJ SOLN: 6.2500 mg | INTRAMUSCULAR | Status: DC | PRN

## 2014-06-03 MED ORDER — CLINDAMYCIN PHOSPHATE 900 MG/50ML IV SOLN
900.0000 mg | Freq: Once | INTRAVENOUS | Status: DC
  Administered 2014-06-03: 900 mg via INTRAVENOUS

## 2014-06-03 MED ORDER — PROPOFOL 10 MG/ML IV BOLUS
INTRAVENOUS | Status: DC | PRN
  Administered 2014-06-03: 100 mg via INTRAVENOUS

## 2014-06-03 MED ORDER — LACTATED RINGERS IV SOLN
INTRAVENOUS | Status: DC
  Administered 2014-06-03: 10:00:00 via INTRAVENOUS

## 2014-06-03 MED ORDER — 0.9 % SODIUM CHLORIDE (POUR BTL) OPTIME
TOPICAL | Status: DC | PRN
  Administered 2014-06-03: 2000 mL
  Administered 2014-06-03: 1000 mL

## 2014-06-03 MED ORDER — LACTATED RINGERS IV SOLN
INTRAVENOUS | Status: DC | PRN
  Administered 2014-06-03 (×2): via INTRAVENOUS

## 2014-06-03 MED ORDER — HYDROMORPHONE HCL 1 MG/ML IJ SOLN
0.2500 mg | INTRAMUSCULAR | Status: DC | PRN
  Administered 2014-06-03 (×4): 0.5 mg via INTRAVENOUS

## 2014-06-03 MED ORDER — FENTANYL CITRATE (PF) 100 MCG/2ML IJ SOLN
INTRAMUSCULAR | Status: DC | PRN
  Administered 2014-06-03: 100 ug via INTRAVENOUS
  Administered 2014-06-03 (×2): 50 ug via INTRAVENOUS

## 2014-06-03 MED ORDER — FAT EMULSION 20 % IV EMUL
240.0000 mL | INTRAVENOUS | Status: AC
  Administered 2014-06-03: 240 mL via INTRAVENOUS
  Filled 2014-06-03: qty 250

## 2014-06-03 MED ORDER — ALBUMIN HUMAN 5 % IV SOLN
INTRAVENOUS | Status: DC | PRN
  Administered 2014-06-03: 11:00:00 via INTRAVENOUS

## 2014-06-03 MED ORDER — FENTANYL CITRATE (PF) 250 MCG/5ML IJ SOLN
INTRAMUSCULAR | Status: AC
  Filled 2014-06-03: qty 5

## 2014-06-03 MED ORDER — CEFAZOLIN SODIUM-DEXTROSE 2-3 GM-% IV SOLR
INTRAVENOUS | Status: DC | PRN
  Administered 2014-06-03: 2 g via INTRAVENOUS

## 2014-06-03 MED ORDER — TRACE MINERALS CR-CU-F-FE-I-MN-MO-SE-ZN IV SOLN
INTRAVENOUS | Status: AC
  Administered 2014-06-03: 17:00:00 via INTRAVENOUS
  Administered 2014-06-03: 80 mL/h via INTRAVENOUS
  Filled 2014-06-03: qty 1920

## 2014-06-03 MED ORDER — LIDOCAINE HCL (CARDIAC) 20 MG/ML IV SOLN
INTRAVENOUS | Status: DC | PRN
  Administered 2014-06-03: 50 mg via INTRAVENOUS

## 2014-06-03 MED ORDER — HEPARIN (PORCINE) IN NACL 100-0.45 UNIT/ML-% IJ SOLN
1150.0000 [IU]/h | INTRAMUSCULAR | Status: DC
  Administered 2014-06-04: 1150 [IU]/h via INTRAVENOUS
  Filled 2014-06-03: qty 250

## 2014-06-03 MED ORDER — PROPOFOL 10 MG/ML IV BOLUS
INTRAVENOUS | Status: AC
  Filled 2014-06-03: qty 20

## 2014-06-03 MED ORDER — HYDROMORPHONE HCL 1 MG/ML IJ SOLN
INTRAMUSCULAR | Status: AC
  Administered 2014-06-03: 15:00:00
  Filled 2014-06-03: qty 1

## 2014-06-03 MED ORDER — ROCURONIUM BROMIDE 100 MG/10ML IV SOLN
INTRAVENOUS | Status: DC | PRN
  Administered 2014-06-03: 50 mg via INTRAVENOUS

## 2014-06-03 MED ORDER — GENTAMICIN IN SALINE 1-0.9 MG/ML-% IV SOLN
100.0000 mg | Freq: Once | INTRAVENOUS | Status: DC
  Administered 2014-06-03: 100 mg via INTRAVENOUS

## 2014-06-03 MED ORDER — SUCCINYLCHOLINE CHLORIDE 20 MG/ML IJ SOLN
INTRAMUSCULAR | Status: DC | PRN
  Administered 2014-06-03: 100 mg via INTRAVENOUS

## 2014-06-03 MED ORDER — HYDROMORPHONE HCL 1 MG/ML IJ SOLN
0.5000 mg | INTRAMUSCULAR | Status: DC | PRN
  Administered 2014-06-03 – 2014-06-04 (×4): 1 mg via INTRAVENOUS
  Administered 2014-06-04: 2 mg via INTRAVENOUS
  Administered 2014-06-04: 1 mg via INTRAVENOUS
  Administered 2014-06-05: 2 mg via INTRAVENOUS
  Administered 2014-06-05 – 2014-06-11 (×14): 1 mg via INTRAVENOUS
  Filled 2014-06-03: qty 1
  Filled 2014-06-03: qty 2
  Filled 2014-06-03 (×5): qty 1
  Filled 2014-06-03: qty 2
  Filled 2014-06-03 (×5): qty 1
  Filled 2014-06-03: qty 2
  Filled 2014-06-03 (×5): qty 1
  Filled 2014-06-03: qty 2
  Filled 2014-06-03: qty 1

## 2014-06-03 MED ORDER — VECURONIUM BROMIDE 10 MG IV SOLR
INTRAVENOUS | Status: DC | PRN
  Administered 2014-06-03: 2 mg via INTRAVENOUS

## 2014-06-03 MED ORDER — NEOSTIGMINE METHYLSULFATE 10 MG/10ML IV SOLN
INTRAVENOUS | Status: DC | PRN
  Administered 2014-06-03: 4 mg via INTRAVENOUS

## 2014-06-03 MED ORDER — GLYCOPYRROLATE 0.2 MG/ML IJ SOLN
INTRAMUSCULAR | Status: DC | PRN
  Administered 2014-06-03: .8 mg via INTRAVENOUS

## 2014-06-03 SURGICAL SUPPLY — 64 items
BLADE SURG ROTATE 9660 (MISCELLANEOUS) IMPLANT
BNDG GAUZE ELAST 4 BULKY (GAUZE/BANDAGES/DRESSINGS) ×1 IMPLANT
CANISTER SUCTION 2500CC (MISCELLANEOUS) ×2 IMPLANT
CHLORAPREP W/TINT 26ML (MISCELLANEOUS) ×2 IMPLANT
COVER MAYO STAND STRL (DRAPES) ×2 IMPLANT
COVER SURGICAL LIGHT HANDLE (MISCELLANEOUS) ×2 IMPLANT
DRAPE LAPAROSCOPIC ABDOMINAL (DRAPES) ×2 IMPLANT
DRAPE PROXIMA HALF (DRAPES) ×2 IMPLANT
DRAPE UTILITY XL STRL (DRAPES) ×5 IMPLANT
DRAPE WARM FLUID 44X44 (DRAPE) ×2 IMPLANT
DRSG OPSITE POSTOP 4X10 (GAUZE/BANDAGES/DRESSINGS) IMPLANT
DRSG OPSITE POSTOP 4X8 (GAUZE/BANDAGES/DRESSINGS) IMPLANT
DRSG PAD ABDOMINAL 8X10 ST (GAUZE/BANDAGES/DRESSINGS) ×1 IMPLANT
ELECT BLADE 6.5 EXT (BLADE) ×2 IMPLANT
ELECT CAUTERY BLADE 6.4 (BLADE) ×3 IMPLANT
ELECT REM PT RETURN 9FT ADLT (ELECTROSURGICAL) ×2
ELECTRODE REM PT RTRN 9FT ADLT (ELECTROSURGICAL) ×1 IMPLANT
GAUZE SPONGE 4X4 12PLY STRL (GAUZE/BANDAGES/DRESSINGS) ×1 IMPLANT
GLOVE BIO SURGEON STRL SZ8 (GLOVE) ×4 IMPLANT
GLOVE BIOGEL PI IND STRL 7.0 (GLOVE) IMPLANT
GLOVE BIOGEL PI IND STRL 8 (GLOVE) ×2 IMPLANT
GLOVE BIOGEL PI INDICATOR 7.0 (GLOVE) ×3
GLOVE BIOGEL PI INDICATOR 8 (GLOVE) ×2
GLOVE ECLIPSE 7.5 STRL STRAW (GLOVE) ×1 IMPLANT
GLOVE SURG SS PI 7.0 STRL IVOR (GLOVE) ×5 IMPLANT
GOWN STRL REUS W/ TWL LRG LVL3 (GOWN DISPOSABLE) ×6 IMPLANT
GOWN STRL REUS W/ TWL XL LVL3 (GOWN DISPOSABLE) ×1 IMPLANT
GOWN STRL REUS W/TWL LRG LVL3 (GOWN DISPOSABLE) ×4
GOWN STRL REUS W/TWL XL LVL3 (GOWN DISPOSABLE) ×2
KIT BASIN OR (CUSTOM PROCEDURE TRAY) ×2 IMPLANT
KIT OSTOMY DRAINABLE 2.75 STR (WOUND CARE) ×1 IMPLANT
KIT ROOM TURNOVER OR (KITS) ×2 IMPLANT
LEGGING LITHOTOMY PAIR STRL (DRAPES) IMPLANT
LIGASURE IMPACT 36 18CM CVD LR (INSTRUMENTS) ×1 IMPLANT
NS IRRIG 1000ML POUR BTL (IV SOLUTION) ×5 IMPLANT
PACK GENERAL/GYN (CUSTOM PROCEDURE TRAY) ×2 IMPLANT
PAD ARMBOARD 7.5X6 YLW CONV (MISCELLANEOUS) ×2 IMPLANT
PENCIL BUTTON HOLSTER BLD 10FT (ELECTRODE) ×2 IMPLANT
RELOAD PROXIMATE 75MM BLUE (ENDOMECHANICALS) ×2 IMPLANT
RELOAD STAPLE 75 3.8 BLU REG (ENDOMECHANICALS) IMPLANT
SPECIMEN JAR X LARGE (MISCELLANEOUS) ×2 IMPLANT
SPONGE LAP 18X18 X RAY DECT (DISPOSABLE) ×2 IMPLANT
STAPLER PROXIMATE 75MM BLUE (STAPLE) ×1 IMPLANT
STAPLER VISISTAT 35W (STAPLE) ×2 IMPLANT
SUCTION POOLE TIP (SUCTIONS) ×2 IMPLANT
SURGILUBE 2OZ TUBE FLIPTOP (MISCELLANEOUS) IMPLANT
SUT MON AB 3-0 SH 27 (SUTURE)
SUT MON AB 3-0 SH27 (SUTURE) IMPLANT
SUT PDS AB 1 CTX 36 (SUTURE) IMPLANT
SUT PDS AB 1 TP1 96 (SUTURE) ×2 IMPLANT
SUT PROLENE 2 0 CT 30 (SUTURE) ×1 IMPLANT
SUT PROLENE 2 0 CT2 30 (SUTURE) IMPLANT
SUT PROLENE 2 0 KS (SUTURE) IMPLANT
SUT VIC AB 3-0 SH 18 (SUTURE) ×2 IMPLANT
SUT VICRYL AB 2 0 TIES (SUTURE) ×2 IMPLANT
SUT VICRYL AB 3 0 TIES (SUTURE) ×2 IMPLANT
SYR BULB IRRIGATION 50ML (SYRINGE) ×2 IMPLANT
TAPE CLOTH SURG 6X10 WHT LF (GAUZE/BANDAGES/DRESSINGS) ×1 IMPLANT
TOWEL OR 17X26 10 PK STRL BLUE (TOWEL DISPOSABLE) ×3 IMPLANT
TRAY FOLEY CATH 14FRSI W/METER (CATHETERS) ×1 IMPLANT
TRAY PROCTOSCOPIC FIBER OPTIC (SET/KITS/TRAYS/PACK) IMPLANT
TUBE CONNECTING 12X1/4 (SUCTIONS) ×1 IMPLANT
WATER STERILE IRR 1000ML POUR (IV SOLUTION) IMPLANT
YANKAUER SUCT BULB TIP NO VENT (SUCTIONS) ×1 IMPLANT

## 2014-06-03 NOTE — Progress Notes (Signed)
PROGRESS NOTE  Hannah [name] ZOX:[id number] DOB: Apr 07, 1940 DOA: 05/29/2014 PCP: [name]., DO  74 yo female w/ history DM, diverticulosis, DVT/PE on chronic Coumadin, HTN, and dyslipidemia. Presented w/ intractable N/V in setting of recent dx colonic stricture. Pt has presented to ER on multiple occasions this week for abdominal pain that has progressively worsened and has become assoc with N/V. Sx's began one week ago; CT abdomen 5/20 revealed stricture at desc and sigmoid colon. Plan was for pt to check w/ PCP re OK to hold Coumadin for colonoscopy then proceed as OP. On 5/21 INR was 5.49 so she was instructed to hold her Coumadin. Unfortunately her sx's worsened and she came to ER.  Scope found stricture so surgery was consulted.  Surgery planned for 5/31.     Assessment/Plan: Colonic stricture -admit to medical floor -GI consulted; s/p colonoscopy- showed stricture so surgical consult- plan for surgery 5/31 -TNA -supportive care w/antiemetics -EKG similar to prvious- had ECHO in 11/15: seen by cardiology 2 years ago after PE and was told she did not have to follow up; able to clean her house with out chest pain or SOB   Dehydration/Acute renal failure -IVFs  Follow labs   Hypothyroid -hold Synthroid PO while NPO-convert to IV dosing   Hypertension -hold PO meds for now -on BB so provide scheduled IV dosing -PRN hydralazine   Diabetes mellitus with neurological manifestations, controlled -hold Glucotrol -SSI q 4 while NPO   Long term current use of anticoagulant therapy/History of pulmonary embolism -heparin gtt- back to coumadin once OK with surgery  Hypokalemia -replete to > 4-- await today's labs  Code Status: full Family Communication: daughter at bedside 5/30 Disposition Plan:    Consultants:  GI  surgery  Procedures:  sigmoidoscopy   HPI/Subjective: Anxious about surgery Some pain in abdomen this AM   Objective: Filed Vitals:   06/03/14  0420  BP: 152/93  Pulse: 107  Temp: 98.4 F (36.9 C)  Resp: 17    Intake/Output Summary (Last 24 hours) at 06/03/14 0903 Last data filed at 06/03/14 0659  Gross per 24 hour  Intake 2386.55 ml  Output   1200 ml  Net 1186.55 ml   Filed Weights   05/29/14 1733  Weight: 90.765 kg (200 lb 1.6 oz)    Exam:   General:  Pleasant/cooperative  Cardiovascular: rrr  Respiratory: clear  Abdomen: +BS, soft  Musculoskeletal: no edema   Data Reviewed: Basic Metabolic Panel:  Recent Labs Lab 05/29/14 1310 05/30/14 0638 05/31/14 0608 06/01/14 0525 06/02/14 1035  NA 137 139 135 132* 135  K 3.7 3.6 3.0* 3.2* 3.5  CL 102 107 102 96* 99*  CO2 17* 19* GLUCOSE 289* 170* 179* 215* 172*  BUN CREATININE 1.14* 0.98 0.70 0.72 0.70  CALCIUM 8.9 8.4* 8.0* 8.1* 8.6*  MG  --   --  1.2* 1.7 1.6*  PHOS  --   --  2.1* 3.2 3.1   Liver Function Tests:  Recent Labs Lab 05/29/14 1310 05/30/14 0638 05/31/14 0608 06/02/14 1035  AST 16 14* 12* 14*  ALT 14 10* 9* 12*  ALKPHOS 51 41 37* 44  BILITOT 0.9 0.8 0.8 0.6  PROT 7.2 6.1* 5.0* 5.8*  ALBUMIN 3.4* 2.9* 2.6* 2.8*    Recent Labs Lab 05/29/14 1310  LIPASE 12*   No results for input(s): AMMONIA in the last 168 hours. CBC:  Recent Labs Lab 05/29/14 1310 05/30/14 8119  05/31/14 9977 06/01/14 0525 06/02/14 0530 06/03/14 0620  WBC 6.3 5.2 5.6 7.9 9.3 11.5*  NEUTROABS 4.4  --  2.4  --  5.1  --   HGB 16.1* 14.5 13.0 13.8 13.9 13.4  HCT 49.1* 44.9 40.3 41.3 42.7 41.3  MCV 85.8 87.2 86.9 85.9 85.9 86.4  PLT 304 271 256 272 292 274   Cardiac Enzymes: No results for input(s): CKTOTAL, CKMB, CKMBINDEX, TROPONINI in the last 168 hours. BNP (last 3 results) No results for input(s): BNP in the last 8760 hours.  ProBNP (last 3 results) No results for input(s): PROBNP in the last 8760 hours.  CBG:  Recent Labs Lab 06/02/14 1617 06/02/14 2019 06/03/14 0021 06/03/14 0418 06/03/14 0807  GLUCAP  185* 150* 200* 189* 183*    Recent Results (from the past 240 hour(s))  Surgical pcr screen     Status: None   Collection Time: 06/02/14  8:54 PM  Result Value Ref Range Status   MRSA, PCR NEGATIVE NEGATIVE Final   Staphylococcus aureus NEGATIVE NEGATIVE Final    Comment:        The Xpert SA Assay (FDA approved for NASAL specimens in patients over 4 years of age), is one component of a comprehensive surveillance program.  Test performance has been validated by [facility] for patients greater than or equal to 44 year old. It is not intended to diagnose infection nor to guide or monitor treatment.      Studies: No results found.  Scheduled Meds: . alvimopan  12 mg Oral To SS-Surg  . antiseptic oral rinse  7 mL Mouth Rinse q12n4p  . chlorhexidine  15 mL Mouth Rinse BID  . clindamycin (CLEOCIN) IV  900 mg Intravenous To SS-Surg  . gentamicin  100 mg Intravenous To SS-Surg  . insulin aspart  0-15 Units Subcutaneous 6 times per day  . levothyroxine  12.5 mcg Intravenous Daily  . metoprolol  5 mg Intravenous 4 times per day   Continuous Infusions: . Marland KitchenTPN (CLINIMIX-E) Adult 80 mL/hr at 06/02/14 1734   And  . fat emulsion 240 mL (06/02/14 1734)  . sodium chloride 300 mL (05/31/14 1745)   Antibiotics Given (last 72 hours)    None      Principal Problem:   Colonic stricture Active Problems:   Long term current use of anticoagulant therapy   Hypothyroid   Hypertension   Diabetes mellitus with neurological manifestations, controlled   History of pulmonary embolism   Dehydration   Acute renal failure    Time spent: 25 min    Ciena Sampley  [facility] Pager [phone]. If 7PM-7AM, please contact night-coverage at [email or web address], password [facility] 06/03/2014, 9:03 AM  LOS: 5 days

## 2014-06-03 NOTE — Interval H&P Note (Signed)
History and Physical Interval Note:  06/03/2014 10:16 AM  Hannah [name]  has presented today for surgery, with the diagnosis of COLON STRICTURE  The various methods of treatment have been discussed with the patient and family. After consideration of risks, benefits and other options for treatment, the patient has consented to  Procedure(s): OPEN COLECTOMY WITH POSSIBLE COLOSTOMY (Left) as a surgical intervention .  The patient's history has been reviewed, patient examined, no change in status, stable for surgery.  I have reviewed the patient's chart and labs.  Questions were answered to the patient's satisfaction.     [name]

## 2014-06-03 NOTE — Anesthesia Preprocedure Evaluation (Addendum)
Anesthesia Evaluation  Patient identified by MRN, date of birth, ID band Patient awake    Reviewed: Allergy & Precautions, NPO status , Patient's Chart, lab work & pertinent test results, reviewed documented beta blocker date and time   History of Anesthesia Complications Negative for: history of anesthetic complications  Airway Mallampati: II  TM Distance: >3 FB Neck ROM: Full    Dental  (+) Partial Lower, Partial Upper, Dental Advisory Given   Pulmonary former smoker,    Pulmonary exam normal       Cardiovascular hypertension, Pt. on medications and Pt. on home beta blockers Normal cardiovascular exam Study Conclusions  - Left ventricle: The cavity size was normal. Systolic function was vigorous. The estimated ejection fraction was in the range of 65% to 70%. Wall motion was normal; there were no regional wall motion abnormalities.   Neuro/Psych negative neurological ROS  negative psych ROS   GI/Hepatic Neg liver ROS,   Endo/Other  diabetesHypothyroidism   Renal/GU negative Renal ROS     Musculoskeletal   Abdominal   Peds  Hematology   Anesthesia Other Findings   Reproductive/Obstetrics                            Anesthesia Physical Anesthesia Plan  ASA: III  Anesthesia Plan: General   Post-op Pain Management:    Induction: Intravenous, Rapid sequence and Cricoid pressure planned  Airway Management Planned: Oral ETT  Additional Equipment:   Intra-op Plan:   Post-operative Plan: Extubation in OR  Informed Consent: I have reviewed the patients History and Physical, chart, labs and discussed the procedure including the risks, benefits and alternatives for the proposed anesthesia with the patient or authorized representative who has indicated his/her understanding and acceptance.   Dental advisory given  Plan Discussed with: CRNA, Anesthesiologist and  Surgeon  Anesthesia Plan Comments:         Anesthesia Quick Evaluation

## 2014-06-03 NOTE — Progress Notes (Signed)
PARENTERAL NUTRITION CONSULT NOTE - FOLLOW UP  Pharmacy Consult:  TPN Indication:  Colonic stricture  Allergies  Allergen Reactions  . Augmentin [Amoxicillin-Pot Clavulanate] Other (See Comments)    Severe vaginal itching  . Tranxene [Clorazepate] Itching    Patient Measurements: Height: _0  (170.2 cm) Weight: 200 lb 1.6 oz (90.765 kg) IBW/kg (Calculated) : 61.6  Vital Signs: Temp: 98.4 F (36.9 C) (05/31 0420) Temp Source: Oral (05/31 0420) BP: 152/93 mmHg (05/31 0420) Pulse Rate: 107 (05/31 0420) Intake/Output from previous day: 05/30 0701 - 05/31 0700 In: 2386.6 [I.V.:247.4; TPN:2139.2] Out: 1200 [Urine:1200] Intake/Output from this shift: Total I/O In: 0  Out: 300 [Urine:300]  Labs:  Recent Labs  06/01/14 0525 06/02/14 0530 06/03/14 0620  WBC 7.9 9.3 11.5*  HGB 13.8 13.9 13.4  HCT 41.3 42.7 41.3  PLT 272 292 274  INR 1.25 1.14 1.07     Recent Labs  06/01/14 0525 06/02/14 0530 06/02/14 1035  NA 132*  --  135  K 3.2*  --  3.5  CL 96*  --  99*  CO2 25  --  27  GLUCOSE 215*  --  172*  BUN 10  --  16  CREATININE 0.72  --  0.70  CALCIUM 8.1*  --  8.6*  MG 1.7  --  1.6*  PHOS 3.2  --  3.1  PROT  --   --  5.8*  ALBUMIN  --   --  2.8*  AST  --   --  14*  ALT  --   --  12*  ALKPHOS  --   --  44  BILITOT  --   --  0.6  PREALBUMIN  --  13.0*  --   TRIG  --   --  97   Estimated Creatinine Clearance: 72.5 mL/min (by C-G formula based on Cr of 0.7).    Recent Labs  06/03/14 0418 06/03/14 0807 06/03/14 0928  GLUCAP 189* 183* 175*     Insulin Requirements in the past 24 hours:  19 units moderate SSI + 45 units regular insulin in TPN  Assessment: 74 YOF found to have a colonic stricture with obstruction on colonoscopy 05/30/14.  Currently in the OR for sigmoid colectomy and possible colostomy.  She continues on TPN for nutritional support.  Surgeries/Procedures:      *5/27: colonoscopy showed colonic stricture found    *5/31: sigmoid  colectomy, colostomy  GI: Prealbumin 11.8 >> 13.  +BM Endo: hypothyroid on IV Synthroid.  DM - CBGs uncontrolled (metformin and glipizide PTA) Lytes: 5/30 labs - low CL, Mag 1.6 Renal: SCr stable, CrCL 73 ml/min - good UOP 0.6 ml/kg/hr Cards: HTN / HLD - BP high normal, HR mostly controlled - IV Lopressor Hepatobil: AST and ALT below normal range, alk phos normal, tbili 0.8.  TG WNL ID: afebrile, WBC mildly elevated at 11.5 - not on abx Anticoag: hx DVT/PE s/p IVC filter, Coumadin PTA, INR therapeutic on admit, now reversed for surgery Best Practices: heparin gtt, MC TPN Access: PICC line  placed 05/30/14 TPN start date: 05/30/14  Current Nutrition:  Clinimix  Nutritional Goals:  1850-2050 kCal, 85-95g protein per day   Plan:  - Continue Clinimix E 5/15 at 80 ml/hr + IVFE at 10 ml/hr - Daily multivitamin and trace elements - Daily thiamine and folate per MD - Increase insulin in TPN to 55 units + continue SSI - F/U daily    Konor Noren D. Mina Marble, PharmD, [place] Pager:  [phone] -  [phone] 06/03/2014, 10:54 AM

## 2014-06-03 NOTE — Anesthesia Procedure Notes (Signed)
Procedure Name: Intubation Date/Time: 06/03/2014 11:01 AM Performed by: Carmela Rima Pre-anesthesia Checklist: Patient being monitored, Suction available, Emergency Drugs available, Patient identified and Timeout performed Patient Re-evaluated:Patient Re-evaluated prior to inductionOxygen Delivery Method: Circle system utilized Preoxygenation: Pre-oxygenation with 100% oxygen Intubation Type: IV induction Ventilation: Mask ventilation without difficulty Laryngoscope Size: Mac and 3 Grade View: Grade I Tube type: Oral Tube size: 7.0 mm Number of attempts: 1 Placement Confirmation: positive ETCO2,  breath sounds checked- equal and bilateral and ETT inserted through vocal cords under direct vision Secured at: 21 cm Tube secured with: Tape Dental Injury: Teeth and Oropharynx as per pre-operative assessment

## 2014-06-03 NOTE — Transfer of Care (Signed)
Immediate Anesthesia Transfer of Care Note  Patient: Hannah [name]  Procedure(s) Performed: Procedure(s): PARTIAL COLECTOMY (N/A) COLOSTOMY (N/A)  Patient Location: PACU  Anesthesia Type:General  Level of Consciousness: awake, alert  and oriented  Airway & Oxygen Therapy: Patient Spontanous Breathing and Patient connected to face mask oxygen  Post-op Assessment: Report given to RN, Post -op Vital signs reviewed and stable and Patient moving all extremities X 4  Post vital signs: Reviewed and stable  Last Vitals:  Filed Vitals:   06/03/14 0420  BP: 152/93  Pulse: 107  Temp: 36.9 C  Resp: 17    Complications: No apparent anesthesia complications

## 2014-06-03 NOTE — Progress Notes (Signed)
4 Days Post-Op  Subjective: Pt doing well   Objective: Vital signs in last 24 hours: Temp:  [98.4 F (36.9 C)-98.7 F (37.1 C)] 98.4 F (36.9 C) (05/31 0420) Pulse Rate:  [82-107] 107 (05/31 0420) Resp:  [15-17] 17 (05/31 0420) BP: (146-158)/(80-98) 152/93 mmHg (05/31 0420) SpO2:  [97 %-100 %] 97 % (05/31 0420) Last BM Date: 06/02/14  Intake/Output from previous day: 05/30 0701 - 05/31 0700 In: 2386.6 [I.V.:247.4; TPN:2139.2] Out: 1200 [Urine:1200] Intake/Output this shift: Total I/O In: 0  Out: 300 [Urine:300]  General appearance: alert, cooperative and appears stated age  Lab Results:   Recent Labs  06/02/14 0530 06/03/14 0620  WBC 9.3 11.5*  HGB 13.9 13.4  HCT 42.7 41.3  PLT 292 274   BMET  Recent Labs  06/01/14 0525 06/02/14 1035  NA 132* 135  K 3.2* 3.5  CL 96* 99*  CO2 25 27  GLUCOSE 215* 172*  BUN 10 16  CREATININE 0.72 0.70  CALCIUM 8.1* 8.6*   PT/INR  Recent Labs  06/02/14 0530 06/03/14 0620  LABPROT 14.7 14.1  INR 1.14 1.07   ABG No results for input(s): PHART, HCO3 in the last 72 hours.  Invalid input(s): PCO2, PO2  Studies/Results: No results found.  Anti-infectives: Anti-infectives    Start     Dose/Rate Route Frequency Ordered Stop   06/03/14 0900  gentamicin (GARAMYCIN) IVPB 100 mg     100 mg 200 mL/hr over 30 Minutes Intravenous To ShortStay Surgical 06/02/14 1111 06/04/14 0900   06/03/14 0900  clindamycin (CLEOCIN) IVPB 900 mg     900 mg 100 mL/hr over 30 Minutes Intravenous To ShortStay Surgical 06/02/14 1208 06/04/14 0900   06/02/14 1115  clindamycin (CLEOCIN) IVPB 900 mg  Status:  Discontinued     900 mg 100 mL/hr over 30 Minutes Intravenous On call to O.R. 06/02/14 1111 06/02/14 1206      Assessment/Plan: Sigmoid colon stricture / colonic obstruction  Sigmoid colectomy colostomy today         The procedure was discussed with the patient.  partial colectomy discussed with the patient as well as non operative  treatments. The risks of operative management include bleeding,  Infection,  Leak of anastamosis,  Ostomy formation, open procedure,  Sepsis,  Abcess,  Hernia,  DVT,  Pulmonary complications,  Cardiovascular  complications,  Injury to ureter,  Bladder,kidney,and anesthesia risks,  And death. The patient understands.  Questions answered.   The success of the procedure is 50-85 % for treating the patients symptoms. They agree to proceed.     LOS: 5 days    Jadyn Barge A. 06/03/2014

## 2014-06-03 NOTE — Op Note (Signed)
Preop diagnosis: Large bowel obstruction from sigmoid colon stricture  Postoperative diagnosis: Same  Procedure: Exploratory laparotomy with partial colectomy and colostomy  Surgeon: Erroll Luna M.D.  Anesthesia: Gen. endotracheal anesthesia  EBL: 60 mL  Specimen: Sigmoid colon to pathology  IV fluids 2 L of crystalloid  Drains none  Indications for procedure: Patient is a 74 year old female who has been in the hospital since last week due to a colonic stricture at her sigmoid colon diagnosed by colonoscopy. This was felt to be benign by the gastroenterologist. She is significant bowel obstruction from this and presents today for surgical resection and colostomy. She is undergone extensive medical workup preoperatively.The procedure was discussed with the patient.  partial colectomy  And colostomy discussed with the patient as well as non operative treatments. The risks of operative management include bleeding,  Infection,  Leak of anastamosis,  Ostomy formation, open procedure,  Sepsis,  Abcess,  Hernia,  DVT,  Pulmonary complications,  Cardiovascular  complications,  Injury to ureter,  Bladder,kidney,and anesthesia risks,  And death. The patient understands.  Questions answered.   The success of the procedure is 50-85 % for treating the patients symptoms. They agree to proceed.  Description of procedure: Patient was met in holding area and questions are answered. She was taken back to the operating room placed upon the OR table. After induction of general endotracheal anesthesia, a Foley catheter was placed. The abdomen was prepped and draped in a sterile fashion. Incision was used after timeout to verify proper patient and procedure. Colostomy site was marked preoperatively. The abdominal cavity is entered in the midline under sterile conditions. There are numerous small bowel adhesions the small bowel was massively dilated. The colon was dilated to the sigmoid colon.  Adhesions were lysed.  Left colon was mobilized along the white line of Toldt. The sigmoid colon was extensively stuck down to the left retroperitoneum.  We  carefully dissected the sigmoid colon from this taking care not to injure the vessels or  left ureter. These were identified and preserved. The proximal sigmoid colon was divided by GIA-75 stapling device. The mesentery was taken down with LigaSure. The distal resection margin was just distal stricture in the distal sigmoid colon. Stapler  was fired and the specimen was removed with the distal end marked. Pelvis is irrigated and found to be hemostatic. The small bowel was run from ligament of Treitz to the ileocecal valve and there is no evidence of injury. A sending transverse remainder the descending colon was dilated but uninjured. Appendix was normal. NG tube placed in the stomach and this was palpated. Abdominal cavity is irrigated and  In the left lateral abdomen a curvilinear incision was made and  creation of colostomy was done there. Dissection was carried down in the fascia was opened in a cruciate fashion to the rectus sheath. The large bowel was brought through this and was not twisted. Fascia was closed with double-stranded PDS. Skin left open and packed. Colostomy matured with 3- 0 in 2-0 Vicryl. Midline wound packed. All final counts the sponge, needles and found to be correct this portion the case. Patient awoke, extubated taken to recovery in satisfactory condition.

## 2014-06-03 NOTE — H&P (View-Only) (Signed)
4 Days Post-Op  Subjective: Pt doing well   Objective: Vital signs in last 24 hours: Temp:  [98.4 F (36.9 C)-98.7 F (37.1 C)] 98.4 F (36.9 C) (05/31 0420) Pulse Rate:  [82-107] 107 (05/31 0420) Resp:  [15-17] 17 (05/31 0420) BP: (146-158)/(80-98) 152/93 mmHg (05/31 0420) SpO2:  [97 %-100 %] 97 % (05/31 0420) Last BM Date: 06/02/14  Intake/Output from previous day: 05/30 0701 - 05/31 0700 In: 2386.6 [I.V.:247.4; TPN:2139.2] Out: 1200 [Urine:1200] Intake/Output this shift: Total I/O In: 0  Out: 300 [Urine:300]  General appearance: alert, cooperative and appears stated age  Lab Results:   Recent Labs  06/02/14 0530 06/03/14 0620  WBC 9.3 11.5*  HGB 13.9 13.4  HCT 42.7 41.3  PLT 292 274   BMET  Recent Labs  06/01/14 0525 06/02/14 1035  NA 132* 135  K 3.2* 3.5  CL 96* 99*  CO2 25 27  GLUCOSE 215* 172*  BUN 10 16  CREATININE 0.72 0.70  CALCIUM 8.1* 8.6*   PT/INR  Recent Labs  06/02/14 0530 06/03/14 0620  LABPROT 14.7 14.1  INR 1.14 1.07   ABG No results for input(s): PHART, HCO3 in the last 72 hours.  Invalid input(s): PCO2, PO2  Studies/Results: No results found.  Anti-infectives: Anti-infectives    Start     Dose/Rate Route Frequency Ordered Stop   06/03/14 0900  gentamicin (GARAMYCIN) IVPB 100 mg     100 mg 200 mL/hr over 30 Minutes Intravenous To ShortStay Surgical 06/02/14 1111 06/04/14 0900   06/03/14 0900  clindamycin (CLEOCIN) IVPB 900 mg     900 mg 100 mL/hr over 30 Minutes Intravenous To ShortStay Surgical 06/02/14 1208 06/04/14 0900   06/02/14 1115  clindamycin (CLEOCIN) IVPB 900 mg  Status:  Discontinued     900 mg 100 mL/hr over 30 Minutes Intravenous On call to O.R. 06/02/14 1111 06/02/14 1206      Assessment/Plan: Sigmoid colon stricture / colonic obstruction  Sigmoid colectomy colostomy today         The procedure was discussed with the patient.  partial colectomy discussed with the patient as well as non operative  treatments. The risks of operative management include bleeding,  Infection,  Leak of anastamosis,  Ostomy formation, open procedure,  Sepsis,  Abcess,  Hernia,  DVT,  Pulmonary complications,  Cardiovascular  complications,  Injury to ureter,  Bladder,kidney,and anesthesia risks,  And death. The patient understands.  Questions answered.   The success of the procedure is 50-85 % for treating the patients symptoms. They agree to proceed.     LOS: 5 days    Zakyra Kukuk A. 06/03/2014  

## 2014-06-03 NOTE — Consult Note (Signed)
WOC ostomy consult note Stoma type/location:  Consult requested for leaking ostomy pouch.  Pt is in PACU after surgery for colostomy was performed this afternoon. Stomal assessment/size: Stoma to LLQ red and viable, flush with skin level, approx 11/4 inches Peristomal assessment: Intact skin surrounding Output: Large amt liquid brown stool has leaked out around pouch Ostomy pouching: 1pc. Education provided: Pt is sedated and not ready for teaching; will begin educational sessions when stable.  Applied barrier ring and one piece convex pouch.  Changed moist gauze dressing where pouch had leaked into abd wound.   Enrolled patient in [facility] DC program: No Cammie Mcgee MSN, RN, [place], [place], [place] [phone]

## 2014-06-03 NOTE — Anesthesia Postprocedure Evaluation (Signed)
  Anesthesia Post-op Note  Patient: Hannah [name]  Procedure(s) Performed: Procedure(s): PARTIAL COLECTOMY (N/A) COLOSTOMY (N/A)  Patient Location: PACU  Anesthesia Type: General   Level of Consciousness: awake, alert  and oriented  Airway and Oxygen Therapy: Patient Spontanous Breathing  Post-op Pain: moderate  Post-op Assessment: Post-op Vital signs reviewed  Post-op Vital Signs: Reviewed  Last Vitals:  Filed Vitals:   06/03/14 1330  BP: 113/76  Pulse: 123  Temp: 36.1 C  Resp: 22    Complications: No apparent anesthesia complications

## 2014-06-04 ENCOUNTER — Encounter (HOSPITAL_COMMUNITY): Payer: Self-pay | Admitting: Surgery

## 2014-06-04 DIAGNOSIS — E038 Other specified hypothyroidism: Secondary | ICD-10-CM

## 2014-06-04 LAB — GLUCOSE, CAPILLARY
GLUCOSE-CAPILLARY: 315 mg/dL — AB (ref 65–99)
Glucose-Capillary: 275 mg/dL — ABNORMAL HIGH (ref 65–99)
Glucose-Capillary: 281 mg/dL — ABNORMAL HIGH (ref 65–99)
Glucose-Capillary: 215 mg/dL — ABNORMAL HIGH (ref 65–99)
Glucose-Capillary: 247 mg/dL — ABNORMAL HIGH (ref 65–99)
Glucose-Capillary: 242 mg/dL — ABNORMAL HIGH (ref 65–99)

## 2014-06-04 LAB — HEPARIN LEVEL (UNFRACTIONATED): Heparin Unfractionated: 0.19 IU/mL — ABNORMAL LOW (ref 0.30–0.70)

## 2014-06-04 LAB — PROTIME-INR
INR: 1.21 (ref 0.00–1.49)
PROTHROMBIN TIME: 15.5 s — AB (ref 11.6–15.2)

## 2014-06-04 MED ORDER — INSULIN ASPART 100 UNIT/ML ~~LOC~~ SOLN
0.0000 [IU] | SUBCUTANEOUS | Status: DC
  Administered 2014-06-04: 11 [IU] via SUBCUTANEOUS
  Administered 2014-06-04 (×3): 7 [IU] via SUBCUTANEOUS
  Administered 2014-06-05 (×3): 4 [IU] via SUBCUTANEOUS
  Administered 2014-06-05: 7 [IU] via SUBCUTANEOUS
  Administered 2014-06-05: 3 [IU] via SUBCUTANEOUS
  Administered 2014-06-05: 4 [IU] via SUBCUTANEOUS
  Administered 2014-06-06: 3 [IU] via SUBCUTANEOUS
  Administered 2014-06-06 (×2): 4 [IU] via SUBCUTANEOUS
  Administered 2014-06-07 (×2): 3 [IU] via SUBCUTANEOUS
  Administered 2014-06-07: 4 [IU] via SUBCUTANEOUS
  Administered 2014-06-08 – 2014-06-09 (×2): 3 [IU] via SUBCUTANEOUS
  Administered 2014-06-10: 4 [IU] via SUBCUTANEOUS
  Administered 2014-06-11: 3 [IU] via SUBCUTANEOUS

## 2014-06-04 MED ORDER — TRACE MINERALS CR-CU-F-FE-I-MN-MO-SE-ZN IV SOLN
INTRAVENOUS | Status: AC
  Administered 2014-06-04: 18:00:00 via INTRAVENOUS
  Filled 2014-06-04: qty 1920

## 2014-06-04 MED ORDER — HEPARIN (PORCINE) IN NACL 100-0.45 UNIT/ML-% IJ SOLN
1100.0000 [IU]/h | INTRAMUSCULAR | Status: DC
  Administered 2014-06-04: 1150 [IU]/h via INTRAVENOUS
  Administered 2014-06-05: 1250 [IU]/h via INTRAVENOUS
  Administered 2014-06-06 – 2014-06-09 (×5): 1400 [IU]/h via INTRAVENOUS
  Administered 2014-06-09 – 2014-06-11 (×4): 1500 [IU]/h via INTRAVENOUS
  Administered 2014-06-12: 1400 [IU]/h via INTRAVENOUS
  Administered 2014-06-15: 1100 [IU]/h via INTRAVENOUS
  Filled 2014-06-04 (×20): qty 250

## 2014-06-04 MED ORDER — FAT EMULSION 20 % IV EMUL
240.0000 mL | INTRAVENOUS | Status: AC
  Administered 2014-06-04: 240 mL via INTRAVENOUS
  Filled 2014-06-04: qty 250

## 2014-06-04 NOTE — Progress Notes (Signed)
06/04/14  Pharmacy-  Heparin  Clarified with Unknown Jim, PA, Heparin is not to have a stop time of midnight tonight.  Heparin order has been re-entered to eliminate this.  Marisue Humble, PharmD Clinical Pharmacist [facility]

## 2014-06-04 NOTE — Evaluation (Signed)
Physical Therapy Evaluation Patient Details Name: Hannah [name] MRN: [id number] DOB: 1940/03/07 Today's Date: 06/04/2014   History of Present Illness  is a 74 year old female with a PMH of DVT s/p IVC filter, chronic anticoagulation. , HTN, DM, and diverticulitis 04/2008 admitted for abdominal pain, nausea, vomiting, and sigmoid colon stricture. She has been to the ER multiple times over the past week. Her pain started over a week ago and it started as hyperactive bowel sounds. Progressively her symptoms worsened and she started to have more issues with abdominal pain, nausea, and vomiting. s/P exploratory lap with colectomy and colostomy on 06/03/14  Clinical Impression  Patient requires  Max assist for  Bed mobility, able to stand and shuffle to recliner, very weak. Patient will benefit from PT to address problems listed under PT Problem list below.  Patient may need to consider SNF if progress  Is slow. Daughter anticipates DC to her home.    Follow Up Recommendations Home health PT;Supervision/Assistance - 24 hour (per daughter)    Equipment Recommendations  None recommended by PT    Recommendations for Other Services       Precautions / Restrictions Precautions Precaution Comments: colostomy, NGsuction, abd.incision      Mobility  Bed Mobility Overal bed mobility: Needs Assistance Bed Mobility: Rolling;Sidelying to Sit Rolling: Max assist Sidelying to sit: Max assist;HOB elevated       General bed mobility comments: extra time to roll, several attempts to get to side, pulled to side using bed pad, HOB raised to assist trunk to more upright.  Transfers Overall transfer level: Needs assistance Equipment used: Rolling walker (2 wheeled) Transfers: Sit to/from [employer] Sit to Stand: +2 physical assistance;+2 safety/equipment;From elevated surface;Mod assist Stand pivot transfers: +2 safety/equipment;+2 physical assistance;Mod assist       General  transfer comment: extra time , assist to power up to standing, takes small shuffle steps to turn  to recliner, cues for hand placement, assist  to descend to chair,  Ambulation/Gait                Stairs            Wheelchair Mobility    Modified Rankin (Stroke Patients Only)       Balance Overall balance assessment: Needs assistance Sitting-balance support: Bilateral upper extremity supported Sitting balance-Leahy Scale: Poor Sitting balance - Comments: tends to list to the right Postural control: Right lateral lean                                   Pertinent Vitals/Pain Pain Assessment: Faces Faces Pain Scale: Hurts whole lot Pain Location: abdomen Pain Descriptors / Indicators: Cramping;Discomfort;Grimacing Pain Intervention(s): Limited activity within patient's tolerance;Monitored during session;Patient requesting pain meds-RN notified;Repositioned    Home Living Family/patient expects to be discharged to:: Private residence Living Arrangements: Alone Available Help at Discharge: Family;Available 24 hours/day Type of Home: House Home Access: Stairs to enter   [employer] of Steps: 1 Home Layout: One level   Additional Comments: to stay with daughter    Prior Function                 Hand Dominance        Extremity/Trunk Assessment               Lower Extremity Assessment: Generalized weakness      Cervical / Trunk Assessment: Normal  Communication  Cognition Arousal/Alertness: Awake/alert Behavior During Therapy: WFL for tasks assessed/performed Overall Cognitive Status: Within Functional Limits for tasks assessed                      General Comments      Exercises        Assessment/Plan    PT Assessment Patient needs continued PT services  PT Diagnosis Difficulty walking;Acute pain;Generalized weakness   PT Problem List Decreased strength;Decreased activity  tolerance;Decreased mobility;Decreased knowledge of precautions;Decreased safety awareness;Decreased knowledge of use of DME;Pain  PT Treatment Interventions DME instruction;Gait training;Functional mobility training;Therapeutic activities;Therapeutic exercise;Patient/family education   PT Goals (Current goals can be found in the Care Plan section) Acute Rehab PT Goals Patient Stated Goal: agreed to getting OOB PT Goal Formulation: With patient/family Time For Goal Achievement: 06/18/14 Potential to Achieve Goals: Good    Frequency Min 3X/week   Barriers to discharge        Co-evaluation               End of Session   Activity Tolerance: Patient limited by pain;Patient limited by fatigue Patient left: in chair;with call bell/phone within reach;with nursing/sitter in room;with family/visitor present Nurse Communication: Mobility status         Time: 0245-0325 PT Time Calculation (min) (ACUTE ONLY): 40 min   Charges:   PT Evaluation $Initial PT Evaluation Tier I: 1 Procedure PT Treatments $Therapeutic Activity: 23-37 mins   PT G Codes:        Rada Hay 06/04/2014, 3:38 PM Blanchard Kelch PT [phone]

## 2014-06-04 NOTE — Progress Notes (Signed)
[facility] PROGRESS NOTE  Hannah [name] ZOX:[id number] DOB: [birth year] DOA: 05/29/2014  PCP: [name]., DO  Brief HPI:  73 yo female w/ history DM, diverticulosis, DVT/PE on chronic Coumadin, HTN, and dyslipidemia. Presented w/ intractable N/V in setting of recent dx colonic stricture. Pt had presented to ER on multiple occasions for abdominal pain that has progressively worsened and has become assoc with N/V. Sx's began one week ago; CT abdomen 5/20 revealed stricture at descending and sigmoid colon. Plan was for pt to check w/ PCP re OK to hold Coumadin for colonoscopy then proceed as OP. On 5/21 INR was 5.49 so she was instructed to hold her Coumadin. Unfortunately her sx's worsened and she came to ER. On colonoscopy, she was found to have stricture so surgery was consulted.She underwent exploratory laparotomy with partial colectomy and colostomy on May 31.  Past medical history:  Past Medical History  Diagnosis Date  . Thyroid disease   . DVT (deep venous thrombosis)   . Acute massive pulmonary embolism 07/13/2012    Massive PE w/ PEA arrest 07/13/12 >TNK >IVC filter >discharged on comadin    . Cardiogenic shock 07/13/2012  . Hemorrhagic shock 07/15/2012  . Acute renal failure 07/14/2012  . Acute respiratory failure with hypoxia 07/14/2012  . Hyperlipemia 02/25/2013  . Hypertension 06/06/2013  . Diabetes mellitus with neurological manifestations, controlled 06/06/2013    Consultants: Gen. Surgery, gastroenterology  Procedures:  Flexible sigmoidoscopy May 27 She underwent exploratory laparotomy with partial colectomy and colostomy on May 31  Antibiotics: None  Subjective: Patient feels well this morning. Denies any abdominal pain except for some soreness over her incision site. Denies any nausea or vomiting.  Objective: Vital Signs  Filed Vitals:   06/03/14 1704 06/03/14 2203 06/04/14 0022 06/04/14 0435  BP: 139/51 98/56 113/59 117/70  Pulse: 103 120 98 118  Temp:  98.5 F (36.9 C) 98.6 F (37 C)  98.5 F (36.9 C)  TempSrc: Oral Oral  Oral  Resp: Height:      Weight:      SpO2: 93% 94%  96%    Intake/Output Summary (Last 24 hours) at 06/04/14 8119 Last data filed at 06/04/14 0700  Gross per 24 hour  Intake 5036.5 ml  Output   1525 ml  Net 3511.5 ml   Filed Weights   05/29/14 1733  Weight: 90.765 kg (200 lb 1.6 oz)    General appearance: alert, cooperative, appears stated age, no distress and moderately obese Head: Normocephalic, without obvious abnormality, atraumatic Resp: clear to auscultation bilaterally Cardio: regular rate and rhythm, S1, S2 normal, no murmur, click, rub or gallop GI: Most of the abdomen covered with dressing. There is ostomy bag noted on the left. No bowel sounds. Extremities: extremities normal, atraumatic, no cyanosis or edema Neurologic: No focal deficits.  Lab Results:  Basic Metabolic Panel:  Recent Labs Lab 05/29/14 1310 05/30/14 0638 05/31/14 0608 06/01/14 0525 06/02/14 1035  NA 137 139 135 132* 135  K 3.7 3.6 3.0* 3.2* 3.5  CL 102 107 102 96* 99*  CO2 17* 19* GLUCOSE 289* 170* 179* 215* 172*  BUN CREATININE 1.14* 0.98 0.70 0.72 0.70  CALCIUM 8.9 8.4* 8.0* 8.1* 8.6*  MG  --   --  1.2* 1.7 1.6*  PHOS  --   --  2.1* 3.2 3.1   Liver Function Tests:  Recent Labs Lab 05/29/14 1310 05/30/14 0638 05/31/14  1610 06/02/14 1035  AST 16 14* 12* 14*  ALT 14 10* 9* 12*  ALKPHOS 51 41 37* 44  BILITOT 0.9 0.8 0.8 0.6  PROT 7.2 6.1* 5.0* 5.8*  ALBUMIN 3.4* 2.9* 2.6* 2.8*    Recent Labs Lab 05/29/14 1310  LIPASE 12*   CBC:  Recent Labs Lab 05/29/14 1310 05/30/14 0638 05/31/14 0608 06/01/14 0525 06/02/14 0530 06/03/14 0620  WBC 6.3 5.2 5.6 7.9 9.3 11.5*  NEUTROABS 4.4  --  2.4  --  5.1  --   HGB 16.1* 14.5 13.0 13.8 13.9 13.4  HCT 49.1* 44.9 40.3 41.3 42.7 41.3  MCV 85.8 87.2 86.9 85.9 85.9 86.4  PLT 304 271 256 272 292 274   CBG:  Recent  Labs Lab 06/03/14 1702 06/03/14 1957 06/04/14 0002 06/04/14 0432 06/04/14 0808  GLUCAP 317* 329* 315* 275* 281*    Recent Results (from the past 240 hour(s))  Surgical pcr screen     Status: None   Collection Time: 06/02/14  8:54 PM  Result Value Ref Range Status   MRSA, PCR NEGATIVE NEGATIVE Final   Staphylococcus aureus NEGATIVE NEGATIVE Final    Comment:        The Xpert SA Assay (FDA approved for NASAL specimens in patients over 66 years of age), is one component of a comprehensive surveillance program.  Test performance has been validated by [facility] for patients greater than or equal to 70 year old. It is not intended to diagnose infection nor to guide or monitor treatment.       Studies/Results: No results found.  Medications:  Scheduled: . antiseptic oral rinse  7 mL Mouth Rinse q12n4p  . chlorhexidine  15 mL Mouth Rinse BID  . insulin aspart  0-20 Units Subcutaneous 6 times per day  . levothyroxine  12.5 mcg Intravenous Daily  . metoprolol  5 mg Intravenous 4 times per day   Continuous: . Marland KitchenTPN (CLINIMIX-E) Adult 80 mL/hr at 06/04/14 0700   And  . fat emulsion 240 mL (06/04/14 0700)  . Marland KitchenTPN (CLINIMIX-E) Adult     And  . fat emulsion    . sodium chloride 10 mL/hr (06/03/14 2311)  . heparin    . lactated ringers 20 mL/hr at 06/03/14 1010  . lactated ringers 10 mL/hr at 06/03/14 1015   RUE:[id number], HYDROmorphone (DILAUDID) injection, HYDROmorphone (DILAUDID) injection, ondansetron (ZOFRAN) IV, promethazine, sodium chloride  Assessment/Plan:  Principal Problem:   Colonic stricture Active Problems:   Long term current use of anticoagulant therapy   Hypothyroid   Hypertension   Diabetes mellitus with neurological manifestations, controlled   History of pulmonary embolism   Dehydration   Acute renal failure    Sigmoid colon stricture GI was consulted. She is s/p flexible sigmoidoscopy which showed sigmoid colon stricture. Surgery was  consulted and the patient underwent harsh of colectomy and colostomy placement on May 31. Currently she has an NG tube. His general surgeries following and managing.   Dehydration/Acute renal failure Improved with IV fluids.  Hypothyroidism Intravenous medications.  History of essential Hypertension PRN hydralazine and intravenous beta blocker  Diabetes mellitus with neurological manifestations, controlled SSI q 4 while NPO  History of pulmonary embolism on anticoagulation with warfarin She was diagnosed with a submassive pulmonary embolism in July 2014. She actually had a PEA arrest and required thrombolytics. Considering this patient was placed on heparin bridge. She is currently off warfarin for the surgery. Heparin infusion to resume this afternoon. When okay with surgery warfarin  to be resumed.  Hypokalemia Repleted. Continue to monitor.  DVT Prophylaxis: On IV heparin    Code Status: Full code  Family Communication: Discussed with the patient  Disposition Plan: Continue current care. Start physical and occupational therapy.    LOS: 6 days   [facility]  [facility] Pager [phone] 06/04/2014, 8:42 AM  If 7PM-7AM, please contact night-coverage at [email or web address], password [facility]

## 2014-06-04 NOTE — Progress Notes (Signed)
PARENTERAL NUTRITION CONSULT NOTE - FOLLOW UP  Pharmacy Consult:  TPN Indication:  Colonic stricture  Allergies  Allergen Reactions  . Augmentin [Amoxicillin-Pot Clavulanate] Other (See Comments)    Severe vaginal itching  . Tranxene [Clorazepate] Itching    Patient Measurements: Height: '5\' 7"'  (170.2 cm) Weight: 200 lb 1.6 oz (90.765 kg) IBW/kg (Calculated) : 61.6  Vital Signs: Temp: 98.5 F (36.9 C) (06/01 0435) Temp Source: Oral (06/01 0435) BP: 117/70 mmHg (06/01 0435) Pulse Rate: 118 (06/01 0435) Intake/Output from previous day: 05/31 0701 - 06/01 0700 In: 1500 [I.V.:1250; IV Piggyback:250] Out: 1525 [Urine:1325; Stool:100; Blood:100]  Labs:  Recent Labs  06/02/14 0530 06/03/14 0620  WBC 9.3 11.5*  HGB 13.9 13.4  HCT 42.7 41.3  PLT 292 274  INR 1.14 1.07     Recent Labs  06/02/14 0530 06/02/14 1035  NA  --  135  K  --  3.5  CL  --  99*  CO2  --  27  GLUCOSE  --  172*  BUN  --  16  CREATININE  --  0.70  CALCIUM  --  8.6*  MG  --  1.6*  PHOS  --  3.1  PROT  --  5.8*  ALBUMIN  --  2.8*  AST  --  14*  ALT  --  12*  ALKPHOS  --  44  BILITOT  --  0.6  PREALBUMIN 13.0*  --   TRIG  --  97   Estimated Creatinine Clearance: 72.5 mL/min (by C-G formula based on Cr of 0.7).    Recent Labs  06/03/14 1957 06/04/14 0002 06/04/14 0432  GLUCAP 329* 315* 275*     Insulin Requirements in the past 24 hours:  31 units moderate SSI + 55 units regular insulin in TPN  Assessment: 74 YOF found to have a colonic stricture with obstruction on colonoscopy 05/30/14.  Currently in the OR for sigmoid colectomy and possible colostomy.  She continues on TPN for nutritional support.  Surgeries/Procedures:      *5/27: colonoscopy showed colonic stricture found    *5/31: partial colectomy and colostomy  GI: Prealbumin 11.8 >> 13.  Colostomy O/P 131m Endo: hypothyroid on IV Synthroid.  DM - CBGs uncontrolled, likely due to stress response post OR 5/31. Lytes:  5/30 labs - low CL, Mag 1.6 Renal: SCr stable, CrCL 73 ml/min - good UOP 0.6 ml/kg/hr Cards: HTN / HLD - BP controlled, tachy - IV Lopressor Hepatobil: AST and ALT below normal range, alk phos normal, tbili 0.8.  TG WNL ID: afebrile, WBC mildly elevated at 11.5 - not on abx Anticoag: hx DVT/PE s/p IVC filter, Coumadin PTA >> heparin gtt Best Practices: heparin gtt, MC TPN Access: PICC line placed 05/30/14 TPN start date: 05/30/14  Current Nutrition:  Clinimix  Nutritional Goals:  1850-2050 kCal, 85-95g protein per day   Plan:  - Continue Clinimix E 5/15 at 80 ml/hr + IVFE at 10 ml/hr - Daily multivitamin and trace elements - Daily thiamine and folate per MD - Increase insulin in TPN to 70 units + continue SSI.  Will rely on SSI for now and be conservative with increasing insulin to avoid hypoglycemia; suspect elevated CBGs are stress related from OR visit. - F/U AM labs, CBGs    Hannah [name] D. DMina Marble PharmD, BCPS Pager:  [phone]/01/2014, 7:31 AM

## 2014-06-04 NOTE — Consult Note (Addendum)
WOC ostomy follow up CCS following for assessment and plan of care. Stoma type/location: Pouch which was applied yesterday is intact with good seal to LLQ colostomy.  Pt feeling poorly with NG intact; first post-op day.  Will begin teaching sessions when she is feeling better.  Educational materials left at bedside and supplies ordered to room for staff nurse use. Cammie Mcgee MSN, RN, CWOCN, [place], CNS [phone]

## 2014-06-04 NOTE — Progress Notes (Signed)
Patient ID: Hannah [name], female   DOB: April 10, 1940, 74 y.o.   MRN: [id number] 1 Day Post-Op  Subjective: Pt sore this morning, but pain well controlled.  No other issues with ostomy over night.  Objective: Vital signs in last 24 hours: Temp:  [97 F (36.1 C)-99.3 F (37.4 C)] 99.3 F (37.4 C) (06/01 0830) Pulse Rate:  [98-123] 116 (06/01 0830) Resp:  [13-27] 18 (06/01 0830) BP: (98-142)/(47-76) 115/68 mmHg (06/01 0830) SpO2:  [93 %-99 %] 94 % (06/01 0830) Last BM Date: 06/04/14  Intake/Output from previous day: 05/31 0701 - 06/01 0700 In: 5036.5 [I.V.:2086.8; IV Piggyback:250; TPN:2699.7] Out: 1525 [Urine:1325; Stool:100; Blood:100] Intake/Output this shift: Total I/O In: -  Out: 10 [Stool:10]  PE: Abd: soft, appropriately tender, wound is clean and packed.  Few BS, NGT with no output.  Ostomy with some air and liquid stool present.    Lab Results:   Recent Labs  06/02/14 0530 06/03/14 0620  WBC 9.3 11.5*  HGB 13.9 13.4  HCT 42.7 41.3  PLT 292 274   BMET  Recent Labs  06/02/14 1035  NA 135  K 3.5  CL 99*  CO2 27  GLUCOSE 172*  BUN 16  CREATININE 0.70  CALCIUM 8.6*   PT/INR  Recent Labs  06/03/14 0620 06/04/14 0832  LABPROT 14.1 15.5*  INR 1.07 1.21   CMP     Component Value Date/Time   NA 135 06/02/2014 1035   K 3.5 06/02/2014 1035   CL 99* 06/02/2014 1035   CO2 27 06/02/2014 1035   GLUCOSE 172* 06/02/2014 1035   BUN 16 06/02/2014 1035   CREATININE 0.70 06/02/2014 1035   CALCIUM 8.6* 06/02/2014 1035   PROT 5.8* 06/02/2014 1035   ALBUMIN 2.8* 06/02/2014 1035   AST 14* 06/02/2014 1035   ALT 12* 06/02/2014 1035   ALKPHOS 44 06/02/2014 1035   BILITOT 0.6 06/02/2014 1035   GFRNONAA >60 06/02/2014 1035   GFRAA >60 06/02/2014 1035   Lipase     Component Value Date/Time   LIPASE 12* 05/29/2014 1310       Studies/Results: No results found.  Anti-infectives: Anti-infectives    Start     Dose/Rate Route Frequency Ordered Stop   06/03/14 1615  gentamicin (GARAMYCIN) IVPB 100 mg  Status:  Discontinued    Comments:  Please make hang time appropriate with when pre-op abx was given   100 mg 200 mL/hr over 30 Minutes Intravenous  Once 06/03/14 1611 06/03/14 1616   06/03/14 1615  clindamycin (CLEOCIN) IVPB 900 mg  Status:  Discontinued     900 mg 100 mL/hr over 30 Minutes Intravenous  Once 06/03/14 1611 06/03/14 1617   06/03/14 0900  gentamicin (GARAMYCIN) IVPB 100 mg     100 mg 200 mL/hr over 30 Minutes Intravenous To [facility] 06/02/14 1111 06/03/14 1130   06/03/14 0900  clindamycin (CLEOCIN) IVPB 900 mg     900 mg 100 mL/hr over 30 Minutes Intravenous To [facility] 06/02/14 1208 06/03/14 1130   06/02/14 1115  clindamycin (CLEOCIN) IVPB 900 mg  Status:  Discontinued     900 mg 100 mL/hr over 30 Minutes Intravenous On call to O.R. 06/02/14 1111 06/02/14 1206       Assessment/Plan  1. POD 1, s/p ex lap with partial colectomy and colostomy --> Dr. Luisa Hart -DC foley tomorrow morning -PT consult for mobilization -mobilize and pulm toilet -BID dressing changes -WOC following patient for new colostomy -cont NGT for today, will likely DC  tomorrow DVT prophylaxis -heparin drip (h/o PE)/SCDs  LOS: 6 days    Hannah [name] E 06/04/2014, 10:21 AM Pager: [phone]

## 2014-06-04 NOTE — Progress Notes (Signed)
Inpatient Diabetes Program Recommendations  AACE/ADA: New Consensus Statement on Inpatient Glycemic Control (2013)  Target Ranges:  Prepandial:   less than 140 mg/dL      Peak postprandial:   less than 180 mg/dL (1-2 hours)      Critically ill patients:  140 - 180 mg/dL     Results for GLADYSE, DUREE (MRN [id number]) as of 06/04/2014 08:26  Ref. Range 06/03/2014 00:21 06/03/2014 04:18 06/03/2014 08:07 06/03/2014 09:28 06/03/2014 13:33 06/03/2014 17:02 06/03/2014 19:57  Glucose-Capillary Latest Ref Range: 65-99 mg/dL 782 (H) 956 (H) 213 (H) 175 (H) 232 (H) 317 (H) 329 (H)    Results for FAYLINN, ROSZAK (MRN [id number]) as of 06/04/2014 08:26  Ref. Range 06/04/2014 00:02 06/04/2014 04:32 06/04/2014 08:08  Glucose-Capillary Latest Ref Range: 65-99 mg/dL 629 (H) 528 (H) 413 (H)     Current DM Orders: Novolog Moderate SSI Q4 hours    **Note patient currently receiving TPN at 80 cc/hour.  Note that pharmacy increased the insulin in the TPN to 70 units (will start this new bag tonight).  **Glucose levels consistently elevated since yesterday afternoon.    MD- Please consider increasing Novolog SSI to Resistant scale Q4 hours  Note that pharmacy continues to adjust insulin in the TPN    Will follow Collen Vincent Tawanna Sat RN, MSN, CDE Diabetes Coordinator Inpatient Diabetes Program Team Pager: [phone] (8a-5p)'

## 2014-06-04 NOTE — Progress Notes (Signed)
ANTICOAGULATION CONSULT NOTE - Follow Up Consult  Pharmacy Consult for Heparin Indication: pulmonary embolus (history)  Allergies  Allergen Reactions  . Augmentin [Amoxicillin-Pot Clavulanate] Other (See Comments)    Severe vaginal itching  . Tranxene [Clorazepate] Itching    Patient Measurements: Height: 5\' 7"  (170.2 cm) Weight: 200 lb 1.6 oz (90.765 kg) IBW/kg (Calculated) : 61.6 Heparin Dosing Weight: 81.5 kg  Vital Signs: Temp: 98.7 F (37.1 C) (06/01 2031) Temp Source: Oral (06/01 2031) BP: 104/69 mmHg (06/01 2031) Pulse Rate: 124 (06/01 2031)  Labs:  Recent Labs  06/02/14 0530 06/02/14 1035 06/02/14 1700 06/03/14 0620 06/04/14 0832 06/04/14 2125  HGB 13.9  --   --  13.4  --   --   HCT 42.7  --   --  41.3  --   --   PLT 292  --   --  274  --   --   LABPROT 14.7  --   --  14.1 15.5*  --   INR 1.14  --   --  1.07 1.21  --   HEPARINUNFRC 0.85*  --  0.33  --   --  0.19*  CREATININE  --  0.70  --   --   --   --     Estimated Creatinine Clearance: 72.5 mL/min (by C-G formula based on Cr of 0.7).   Medications:  Scheduled:  . antiseptic oral rinse  7 mL Mouth Rinse q12n4p  . chlorhexidine  15 mL Mouth Rinse BID  . insulin aspart  0-20 Units Subcutaneous 6 times per day  . levothyroxine  12.5 mcg Intravenous Daily  . metoprolol  5 mg Intravenous 4 times per day   Infusions:  . Marland KitchenTPN (CLINIMIX-E) Adult 80 mL/hr at 06/04/14 1819   And  . fat emulsion 240 mL (06/04/14 1819)  . sodium chloride 10 mL/hr (06/03/14 2311)  . heparin 1,150 Units/hr (06/04/14 1515)  . lactated ringers 20 mL/hr at 06/03/14 1010  . lactated ringers 10 mL/hr at 06/03/14 1015    Assessment: 74 yo F on Coumadin PTA for hx PE/DVT continues on heparin bridge therapy post-op 2/2 NPO status after recent surgery for colonic stricture with obstruction.    Heparin level is subtherapeutic on current rate.  Of note, pt was previously therapeutic on this rate so hesitant to increase  significantly.  Goal of Therapy:  Heparin level 0.3-0.7 units/ml Monitor platelets by anticoagulation protocol: Yes   Plan:  Increase heparin to 1250 units/hr. Next heparin level with AM labs.  [employer], Pharm.D., BCPS Clinical Pharmacist Pager [phone] 06/04/2014 10:49 PM

## 2014-06-04 NOTE — Progress Notes (Signed)
Nutrition Follow-up  DOCUMENTATION CODES:  Obesity unspecified  INTERVENTION:  TPN  NUTRITION DIAGNOSIS:  Inadequate oral intake related to altered GI function as evidenced by NPO status.  Ongoing  GOAL:  Patient will meet greater than or equal to 90% of their needs  Met with TPN  MONITOR:  Diet advancement, Labs, Weight trends, TF tolerance, Skin, I & O's, Other (Comment) (TPN tolerance/adequacy)  REASON FOR ASSESSMENT:  Consult New TPN/TNA  ASSESSMENT:  This is a 74 yo female w/ history DM, diverticulosis, DVT/PE on chronic Coumadin, HTN, and dyslipidemia. Presented w/ intractable N/V in setting of recent dx colonic stricture. Pt has presented to ER on multiple occasions this week for abdominal pain that has progressively worsened and has become assoc with N/V. Sx's began one week ago; CT abdomen 5/20 revealed stricture at desc and sigmoid colon. Plan was for pt to check w/ PCP re OK to hold Coumadin for colonoscopy then proceed as OP. On 5/21 INR was 5.49 so she was instructed to hold her Coumadin. Unfortunately her sx's worsened and she came to ER.  S/p Procedure(s) 05/30/14: FLEXIBLE SIGMOIDOSCOPY (N/A)  S/p Procedure(s) on 06/03/14: OPEN COLECTOMY WITH POSSIBLE COLOSTOMY (Left)  Reviewed C[facility]RN note from 06/04/14- colostomy functioning well and will attempted education once pt is feeling better. Noted 10 ml output this shift.   Pt remains with NGT, connected to low-intermittent suction. Per surgical notes, NGT will likely be d/c tomorrow. No output per doc flowsheets.  Pt is NPO. She is currently receiving Clinimix E 5/15 at goal rate 80 ml/hr + IVFE at 10 ml/hr, which provides 1843 kcal and 96g protein (100% of estimated needs).   Labs reviewed. CBGS elevated (275-315). Pharmacy has adjusted insulin dosage in TPN to address hyperglycemia.   Height:  Ht Readings from Last 1 Encounters:  05/29/14 _0  (1.702 m)    Weight:  Wt Readings from Last 1 Encounters:   05/29/14 200 lb 1.6 oz (90.765 kg)    Ideal Body Weight:  61.4 kg  Wt Readings from Last 10 Encounters:  05/29/14 200 lb 1.6 oz (90.765 kg)  05/25/14 208 lb (94.348 kg)  04/07/14 208 lb 12.8 oz (94.711 kg)  12/06/13 210 lb 3.2 oz (95.346 kg)  11/07/13 215 lb 12.8 oz (97.886 kg)  09/06/13 208 lb (94.348 kg)  06/06/13 212 lb 8 oz (96.389 kg)  03/12/13 213 lb 3.2 oz (96.707 kg)  03/07/13 214 lb 12.8 oz (97.433 kg)  02/25/13 210 lb (95.255 kg)    BMI:  Body mass index is 31.33 kg/(m^2).  Estimated Nutritional Needs:  Kcal:  1850-2050  Protein:  85-95 grams  Fluid:  1.8-2.0 L  Skin:  Wound (see comment) (closed abdominal surgical incision)  Diet Order:  Diet NPO time specified Except for: Sips with Meds .TPN (CLINIMIX-E) Adult .TPN (CLINIMIX-E) Adult  EDUCATION NEEDS:  No education needs identified at this time   Intake/Output Summary (Last 24 hours) at 06/04/14 1434 Last data filed at 06/04/14 0810  Gross per 24 hour  Intake 3536.5 ml  Output    985 ml  Net 2551.5 ml    Last BM:  06/04/14  Reakwon Barren A. Jimmye Norman, RD, LDN, CDE Pager: [phone] After hours Pager: [phone]

## 2014-06-05 LAB — GLUCOSE, CAPILLARY
Glucose-Capillary: 133 mg/dL — ABNORMAL HIGH (ref 65–99)
Glucose-Capillary: 171 mg/dL — ABNORMAL HIGH (ref 65–99)
Glucose-Capillary: 172 mg/dL — ABNORMAL HIGH (ref 65–99)
Glucose-Capillary: 178 mg/dL — ABNORMAL HIGH (ref 65–99)
Glucose-Capillary: 195 mg/dL — ABNORMAL HIGH (ref 65–99)
Glucose-Capillary: 205 mg/dL — ABNORMAL HIGH (ref 65–99)

## 2014-06-05 LAB — COMPREHENSIVE METABOLIC PANEL
ALBUMIN: 2.2 g/dL — AB (ref 3.5–5.0)
ALK PHOS: 59 U/L (ref 38–126)
ALT: 29 U/L (ref 14–54)
ANION GAP: 9 (ref 5–15)
AST: 27 U/L (ref 15–41)
BUN: 55 mg/dL — AB (ref 6–20)
CO2: 23 mmol/L (ref 22–32)
Calcium: 8.7 mg/dL — ABNORMAL LOW (ref 8.9–10.3)
Chloride: 96 mmol/L — ABNORMAL LOW (ref 101–111)
Creatinine, Ser: 1.33 mg/dL — ABNORMAL HIGH (ref 0.44–1.00)
GFR calc Af Amer: 45 mL/min — ABNORMAL LOW (ref >60)
GFR calc non Af Amer: 39 mL/min — ABNORMAL LOW (ref >60)
Glucose, Bld: 158 mg/dL — ABNORMAL HIGH (ref 65–99)
POTASSIUM: 4.9 mmol/L (ref 3.5–5.1)
Sodium: 128 mmol/L — ABNORMAL LOW (ref 135–145)
Total Bilirubin: 0.9 mg/dL (ref 0.3–1.2)
Total Protein: 5.5 g/dL — ABNORMAL LOW (ref 6.5–8.1)

## 2014-06-05 LAB — CBC
HCT: 37.6 % (ref 36.0–46.0)
HEMOGLOBIN: 12.3 g/dL (ref 12.0–15.0)
MCH: 28.1 pg (ref 26.0–34.0)
MCHC: 32.7 g/dL (ref 30.0–36.0)
MCV: 86 fL (ref 78.0–100.0)
PLATELETS: 178 10*3/uL (ref 150–400)
RBC: 4.37 MIL/uL (ref 3.87–5.11)
RDW: 14.8 % (ref 11.5–15.5)
WBC: 17.3 10*3/uL — AB (ref 4.0–10.5)

## 2014-06-05 LAB — PHOSPHORUS: PHOSPHORUS: 5 mg/dL — AB (ref 2.5–4.6)

## 2014-06-05 LAB — HEPARIN LEVEL (UNFRACTIONATED)
HEPARIN UNFRACTIONATED: 0.19 [IU]/mL — AB (ref 0.30–0.70)
Heparin Unfractionated: 0.23 IU/mL — ABNORMAL LOW (ref 0.30–0.70)
Heparin Unfractionated: 0.38 IU/mL (ref 0.30–0.70)

## 2014-06-05 LAB — PROTIME-INR
INR: 1.16 (ref 0.00–1.49)
Prothrombin Time: 15 seconds (ref 11.6–15.2)

## 2014-06-05 LAB — MAGNESIUM: Magnesium: 1.7 mg/dL (ref 1.7–2.4)

## 2014-06-05 MED ORDER — SODIUM CHLORIDE 0.9 % IV SOLN
INTRAVENOUS | Status: AC
  Administered 2014-06-05: 09:00:00 via INTRAVENOUS

## 2014-06-05 MED ORDER — FAT EMULSION 20 % IV EMUL
240.0000 mL | INTRAVENOUS | Status: DC
  Administered 2014-06-06: 240 mL via INTRAVENOUS
  Filled 2014-06-05: qty 250

## 2014-06-05 MED ORDER — FAT EMULSION 20 % IV EMUL
240.0000 mL | INTRAVENOUS | Status: AC
  Administered 2014-06-05: 240 mL via INTRAVENOUS
  Filled 2014-06-05: qty 250

## 2014-06-05 MED ORDER — TRACE MINERALS CR-CU-F-FE-I-MN-MO-SE-ZN IV SOLN
INTRAVENOUS | Status: DC
  Filled 2014-06-05: qty 1920

## 2014-06-05 MED ORDER — FAT EMULSION 20 % IV EMUL
240.0000 mL | INTRAVENOUS | Status: DC
  Filled 2014-06-05: qty 250

## 2014-06-05 MED ORDER — TRACE MINERALS CR-CU-F-FE-I-MN-MO-SE-ZN IV SOLN
INTRAVENOUS | Status: AC
  Administered 2014-06-05: 17:00:00 via INTRAVENOUS
  Filled 2014-06-05: qty 1920

## 2014-06-05 MED ORDER — TRACE MINERALS CR-CU-F-FE-I-MN-MO-SE-ZN IV SOLN
INTRAVENOUS | Status: DC
  Administered 2014-06-06: 18:00:00 via INTRAVENOUS
  Filled 2014-06-05: qty 1920

## 2014-06-05 NOTE — Progress Notes (Signed)
[facility] PROGRESS NOTE  Hannah [name] PJK:[id number] DOB: 1940-06-28 DOA: 05/29/2014  PCP: [name]., DO  Brief HPI:  74 yo female w/ history DM, diverticulosis, DVT/PE on chronic Coumadin, HTN, and dyslipidemia. Presented w/ intractable N/V in setting of recent dx colonic stricture. Pt had presented to ER on multiple occasions for abdominal pain that has progressively worsened and has become assoc with N/V. Sx's began one week ago; CT abdomen 5/20 revealed stricture at descending and sigmoid colon. Plan was for pt to check w/ PCP re OK to hold Coumadin for colonoscopy then proceed as OP. On 5/21 INR was 5.49 so she was instructed to hold her Coumadin. Unfortunately her sx's worsened and she came to ER. On colonoscopy, she was found to have stricture so surgery was consulted.She underwent exploratory laparotomy with partial colectomy and colostomy on May 31.  Past medical history:  Past Medical History  Diagnosis Date  . Thyroid disease   . DVT (deep venous thrombosis)   . Acute massive pulmonary embolism 07/13/2012    Massive PE w/ PEA arrest 07/13/12 >TNK >IVC filter >discharged on comadin    . Cardiogenic shock 07/13/2012  . Hemorrhagic shock 07/15/2012  . Acute renal failure 07/14/2012  . Acute respiratory failure with hypoxia 07/14/2012  . Hyperlipemia 02/25/2013  . Hypertension 06/06/2013  . Diabetes mellitus with neurological manifestations, controlled 06/06/2013    Consultants: Gen. Surgery, gastroenterology  Procedures:  Flexible sigmoidoscopy May 27 She underwent exploratory laparotomy with partial colectomy and colostomy on May 31  Antibiotics: None  Subjective: Patient complains of some soreness in her abdomen. Denies any nausea, vomiting. Her son is at the bedside.   Objective: Vital Signs  Filed Vitals:   06/04/14 1609 06/04/14 2031 06/04/14 2357 06/05/14 0433  BP: 112/62 104/69 152/91 115/56  Pulse: 125 124 124 128  Temp: 99.6 F (37.6 C) 98.7 F  (37.1 C)  98.5 F (36.9 C)  TempSrc: Oral Oral  Oral  Resp: 18 18  18   Height:      Weight:      SpO2: 92% 98%  99%    Intake/Output Summary (Last 24 hours) at 06/05/14 0900 Last data filed at 06/05/14 8099  Gross per 24 hour  Intake 1138.5 ml  Output   1500 ml  Net -361.5 ml   Filed Weights   05/29/14 1733  Weight: 90.765 kg (200 lb 1.6 oz)    General appearance: alert, cooperative, appears stated age, no distress and moderately obese Resp: clear to auscultation bilaterally Cardio: regular rate and rhythm, S1, S2 normal, no murmur, click, rub or gallop GI: Most of the abdomen covered with dressing. There is ostomy bag noted on the left with yellow stool. Bowel sounds heard today, minimal. Extremities: extremities normal, atraumatic, no cyanosis or edema Neurologic: No focal deficits.  Lab Results:  Basic Metabolic Panel:  Recent Labs Lab 05/30/14 0638 05/31/14 0608 06/01/14 0525 06/02/14 1035 06/05/14 0430  NA 139 135 132* 135 128*  K 3.6 3.0* 3.2* 3.5 4.9  CL 107 102 96* 99* 96*  CO2 19* 26 25 27 23   GLUCOSE 170* 179* 215* 172* 158*  BUN 10 8 10 16  55*  CREATININE 0.98 0.70 0.72 0.70 1.33*  CALCIUM 8.4* 8.0* 8.1* 8.6* 8.7*  MG  --  1.2* 1.7 1.6* 1.7  PHOS  --  2.1* 3.2 3.1 5.0*   Liver Function Tests:  Recent Labs Lab 05/29/14 1310 05/30/14 0638 05/31/14 0608 06/02/14 1035 06/05/14 0430  AST 16  14* 12* 14* 27  ALT 14 10* 9* 12* 29  ALKPHOS 51 41 37* 44 59  BILITOT 0.9 0.8 0.8 0.6 0.9  PROT 7.2 6.1* 5.0* 5.8* 5.5*  ALBUMIN 3.4* 2.9* 2.6* 2.8* 2.2*    Recent Labs Lab 05/29/14 1310  LIPASE 12*   CBC:  Recent Labs Lab 05/29/14 1310  05/31/14 0608 06/01/14 0525 06/02/14 0530 06/03/14 0620 06/05/14 0430  WBC 6.3  < > 5.6 7.9 9.3 11.5* 17.3*  NEUTROABS 4.4  --  2.4  --  5.1  --   --   HGB 16.1*  < > 13.0 13.8 13.9 13.4 12.3  HCT 49.1*  < > 40.3 41.3 42.7 41.3 37.6  MCV 85.8  < > 86.9 85.9 85.9 86.4 86.0  PLT 304  < > 256 272 292 274  178  < > = values in this interval not displayed. CBG:  Recent Labs Lab 06/04/14 1600 06/04/14 2027 06/05/14 0037 06/05/14 0430 06/05/14 0808  GLUCAP 247* 242* 172* 133* 195*    Recent Results (from the past 240 hour(s))  Surgical pcr screen     Status: None   Collection Time: 06/02/14  8:54 PM  Result Value Ref Range Status   MRSA, PCR NEGATIVE NEGATIVE Final   Staphylococcus aureus NEGATIVE NEGATIVE Final    Comment:        The Xpert SA Assay (FDA approved for NASAL specimens in patients over 64 years of age), is one component of a comprehensive surveillance program.  Test performance has been validated by [facility] for patients greater than or equal to 74 year old. It is not intended to diagnose infection nor to guide or monitor treatment.       Studies/Results: No results found.  Medications:  Scheduled: . antiseptic oral rinse  7 mL Mouth Rinse q12n4p  . chlorhexidine  15 mL Mouth Rinse BID  . insulin aspart  0-20 Units Subcutaneous 6 times per day  . levothyroxine  12.5 mcg Intravenous Daily  . metoprolol  5 mg Intravenous 4 times per day   Continuous: . Marland KitchenTPN (CLINIMIX-E) Adult 80 mL/hr at 06/04/14 1819   And  . fat emulsion 240 mL (06/04/14 1819)  . sodium chloride 10 mL/hr (06/03/14 2311)  . sodium chloride    . heparin 1,250 Units/hr (06/05/14 0127)  . lactated ringers 20 mL/hr at 06/03/14 1010  . lactated ringers 10 mL/hr at 06/03/14 1015   ZOX:[id number], HYDROmorphone (DILAUDID) injection, HYDROmorphone (DILAUDID) injection, ondansetron (ZOFRAN) IV, promethazine, sodium chloride  Assessment/Plan:  Principal Problem:   Colonic stricture Active Problems:   Long term current use of anticoagulant therapy   Hypothyroid   Hypertension   Diabetes mellitus with neurological manifestations, controlled   History of pulmonary embolism   Dehydration   Acute renal failure    Sigmoid colon stricture GI was consulted. She is s/p flexible  sigmoidoscopy which showed sigmoid colon stricture. Surgery was consulted and the patient underwent partial  colectomy and colostomy placement on May 31. Currently she has an NG tube. General surgery is following and managing.   Dehydration/Acute renal failure Creatinine has bumped up again. Her sodium level is also low. She was in negative fluid balance yesterday. We will give her an infusion of normal saline over the next few hours. This will be along with her TPN. Monitor urine output. Repeat nail function in the morning.  Hypothyroidism She is on Intravenous levothyroxin.  History of essential Hypertension PRN hydralazine and intravenous beta blocker  Diabetes  mellitus with neurological manifestations, controlled SSI q 4 while NPO  History of pulmonary embolism on anticoagulation with warfarin She was diagnosed with a submassive pulmonary embolism in July 2014. She actually had a PEA arrest and required thrombolytics. Considering this patient was placed on heparin bridge. She is currently off warfarin for the surgery. When okay with surgery and when she is able to take orally, warfarin to be resumed.  Hypokalemia Repleted. Continue to monitor.  DVT Prophylaxis: On IV heparin    Code Status: Full code  Family Communication: Discussed with the patient and her son Disposition Plan: Continue current care. Physical and occupational therapy to evaluate. Mobilize.    LOS: 7 days   [facility]  [facility] Pager [phone] 06/05/2014, 9:00 AM  If 7PM-7AM, please contact night-coverage at [email or web address], password [email or web address]

## 2014-06-05 NOTE — Progress Notes (Addendum)
PARENTERAL NUTRITION CONSULT NOTE - FOLLOW UP  Pharmacy Consult:  TPN Indication:  Colonic stricture  Allergies  Allergen Reactions  . Augmentin [Amoxicillin-Pot Clavulanate] Other (See Comments)    Severe vaginal itching  . Tranxene [Clorazepate] Itching    Patient Measurements: Height: '5\' 7"'  (170.2 cm) Weight: 200 lb 1.6 oz (90.765 kg) IBW/kg (Calculated) : 61.6  Vital Signs: Temp: 98.5 F (36.9 C) (06/02 0433) Temp Source: Oral (06/02 0433) BP: 115/56 mmHg (06/02 0433) Pulse Rate: 128 (06/02 0433) Intake/Output from previous day: 06/01 0701 - 06/02 0700 In: 1138.5 [I.V.:120; TPN:1018.5] Out: 1510 [Urine:1000; Emesis/NG output:350; Stool:160]  Labs:  Recent Labs  06/03/14 0620 06/04/14 0832 06/05/14 0430  WBC 11.5*  --  17.3*  HGB 13.4  --  12.3  HCT 41.3  --  37.6  PLT 274  --  178  INR 1.07 1.21 1.16     Recent Labs  06/02/14 1035 06/05/14 0430  NA 135 128*  K 3.5 4.9  CL 99* 96*  CO2 27 23  GLUCOSE 172* 158*  BUN 16 55*  CREATININE 0.70 1.33*  CALCIUM 8.6* 8.7*  MG 1.6* 1.7  PHOS 3.1 5.0*  PROT 5.8* 5.5*  ALBUMIN 2.8* 2.2*  AST 14* 27  ALT 12* 29  ALKPHOS 44 59  BILITOT 0.6 0.9  TRIG 97  --    Estimated Creatinine Clearance: 43.6 mL/min (by C-G formula based on Cr of 1.33).    Recent Labs  06/05/14 0037 06/05/14 0430 06/05/14 0808  GLUCAP 172* 133* 195*     Insulin Requirements in the past 24 hours:  47 units moderate SSI + 70 units regular insulin in TPN  Assessment: 74 YOF found to have a colonic stricture with obstruction on colonoscopy 05/30/14.  Currently in the OR for sigmoid colectomy and possible colostomy.  She continues on TPN for nutritional support.  Surgeries/Procedures:      *5/27: colonoscopy showed colonic stricture found    *5/31: partial colectomy and colostomy  GI: Prealbumin 11.8 >> 13.  Colostomy O/P 167m, NGT 350. To d/c NGT today and leave NPO  Endo: hypothyroid on IV Synthroid.  DM - CBGs  uncontrolled 133-247, likely due to stress response post OR 5/31  Lytes: Na decreased to 128, Phos increased to 5 (Ca x Phos = 55, goal<55), Cl 96  Renal: SCr up to 0.7>>1.33, CrCL 43 ml/min - good UOP 0.5 ml/kg/hr. NS'@75' +NS'@10' +LR'@10' . Foley removed this AM  Cards: HTN / HLD - BP mostly controlled, tachy 120s - IV Lopressor  Hepatobil: LFTs wnl, alk phos, tbili remain wnl, TG WNL  ID: afebrile, WBC upt 17.3 - like acute phase post-surg  Anticoag: hx DVT/PE s/p IVC filter, Coumadin PTA >> heparin gtt  Best Practices: heparin gtt, MC TPN Access: PICC line placed 05/30/14 TPN start date: 05/30/14  Current Nutrition:  Clinimix E 5/15 '@80ml' /h + IVFE '@10ml' /h - to provide 1843kcal + 96g protein  Nutritional Goals:  1850-2050 kCal, 85-95g protein per day   Plan:  - Change to Clinimix NO LYTES 5/15 (due to Ca x Phos product) at goal 80 ml/hr + IVFE at 10 ml/hr to provide 1843kcal and 96g protein (100% of pt's estimated needs) - Daily multivitamin and trace elements - thiamine 1376EGand folic acid 143madded to TPN bag - Increase insulin in TPN to 80 units + continue SSI.  Will rely on SSI for now and be conservative with increasing insulin to avoid hypoglycemia; suspect elevated CBGs are stress related from OR visit. -  Keep maintenance fluid as ordered by MD - F/U AM labs, CBGs    Elicia Lamp, PharmD Clinical Pharmacist - Resident Pager [phone] 06/05/2014 10:37 AM

## 2014-06-05 NOTE — Progress Notes (Signed)
ANTICOAGULATION CONSULT NOTE - Follow Up Consult  Pharmacy Consult for Heparin Indication: pulmonary embolus (history)  Allergies  Allergen Reactions  . Augmentin [Amoxicillin-Pot Clavulanate] Other (See Comments)    Severe vaginal itching  . Tranxene [Clorazepate] Itching    Patient Measurements: Height: 5\' 7"  (170.2 cm) Weight: 200 lb 1.6 oz (90.765 kg) IBW/kg (Calculated) : 61.6 Heparin Dosing Weight:   Vital Signs: Temp: 99.1 F (37.3 C) (06/02 1725) Temp Source: Oral (06/02 1725) BP: 129/56 mmHg (06/02 1725) Pulse Rate: 110 (06/02 1725)  Labs:  Recent Labs  06/03/14 0620 06/04/14 0832  06/05/14 0430 06/05/14 1140 06/05/14 2100  HGB 13.4  --   --  12.3  --   --   HCT 41.3  --   --  37.6  --   --   PLT 274  --   --  178  --   --   LABPROT 14.1 15.5*  --  15.0  --   --   INR 1.07 1.21  --  1.16  --   --   HEPARINUNFRC  --   --   < > 0.23* 0.38 0.19*  CREATININE  --   --   --  1.33*  --   --   < > = values in this interval not displayed.  Estimated Creatinine Clearance: 43.6 mL/min (by C-G formula based on Cr of 1.33).   Medications:  Scheduled:  . antiseptic oral rinse  7 mL Mouth Rinse q12n4p  . chlorhexidine  15 mL Mouth Rinse BID  . insulin aspart  0-20 Units Subcutaneous 6 times per day  . levothyroxine  12.5 mcg Intravenous Daily  . metoprolol  5 mg Intravenous 4 times per day    Assessment: 74yo female with hx of PE, on heparin bridge post-op.  Heparin level therapeutic at 0.38 this am but fell tonight to 0.19 on heparin drip 1250 uts/hr.  Hg and pltc are decreased, in part reflective of post-op status, and both remain wnl.  No bleeding noted.  Goal of Therapy:  Heparin level 0.3-0.7 units/ml Monitor platelets by anticoagulation protocol: Yes   Plan:  Increase Heparin 1400 units/hr Daily CBC, HL Monitor for s/s of bleeding   .Leota Sauers Pharm.D. CPP, BCPS Clinical Pharmacist [phone] 06/05/2014 11:09 PM

## 2014-06-05 NOTE — Progress Notes (Signed)
OT Cancellation Note  Patient Details Name: Starr Gazda MRN: [id number] DOB: Oct 10, 1940   Cancelled Treatment:    Reason Eval/Treat Not Completed: Fatigue/lethargy limiting ability to participate. Pt recently received pain medication and difficult to arouse. When able to wake pt she stated she wanted to rest. Acute OT will follow up as available.    Nena Jordan M 06/05/2014, 2:57 PM

## 2014-06-05 NOTE — Progress Notes (Signed)
Patient ID: Hannah [name], female   DOB: [birth year], 74 y.o.   MRN: [id number] 2 Days Post-Op  Subjective: Pt doesn't provide much information today.  Her husband mostly speaks for her.  Sat up in a chair yesterday.  Foley removed this morning.    Objective: Vital signs in last 24 hours: Temp:  [98.5 F (36.9 C)-99.6 F (37.6 C)] 98.5 F (36.9 C) (06/02 0433) Pulse Rate:  [116-128] 128 (06/02 0433) Resp:  [18-20] 18 (06/02 0433) BP: (104-152)/(56-91) 115/56 mmHg (06/02 0433) SpO2:  [92 %-99 %] 99 % (06/02 0433) Last BM Date: 06/05/14  Intake/Output from previous day: 06/01 0701 - 06/02 0700 In: 1138.5 [I.V.:120; TPN:1018.5] Out: 1510 [Urine:1000; Emesis/NG output:350; Stool:160] Intake/Output this shift:    PE: Abd: soft, some BS, NGT with only 350cc of output since surgery, ostomy is retracted some, but still has output.  Midline wound is clean and packed. Heart: tachy Lungs: CTAB  Lab Results:   Recent Labs  06/03/14 0620 06/05/14 0430  WBC 11.5* 17.3*  HGB 13.4 12.3  HCT 41.3 37.6  PLT 274 178   BMET  Recent Labs  06/02/14 1035 06/05/14 0430  NA 135 128*  K 3.5 4.9  CL 99* 96*  CO2 27 23  GLUCOSE 172* 158*  BUN 16 55*  CREATININE 0.70 1.33*  CALCIUM 8.6* 8.7*   PT/INR  Recent Labs  06/04/14 0832 06/05/14 0430  LABPROT 15.5* 15.0  INR 1.21 1.16   CMP     Component Value Date/Time   NA 128* 06/05/2014 0430   K 4.9 06/05/2014 0430   CL 96* 06/05/2014 0430   CO2 23 06/05/2014 0430   GLUCOSE 158* 06/05/2014 0430   BUN 55* 06/05/2014 0430   CREATININE 1.33* 06/05/2014 0430   CALCIUM 8.7* 06/05/2014 0430   PROT 5.5* 06/05/2014 0430   ALBUMIN 2.2* 06/05/2014 0430   AST 27 06/05/2014 0430   ALT 29 06/05/2014 0430   ALKPHOS 59 06/05/2014 0430   BILITOT 0.9 06/05/2014 0430   GFRNONAA 39* 06/05/2014 0430   GFRAA 45* 06/05/2014 0430   Lipase     Component Value Date/Time   LIPASE 12* 05/29/2014 1310       Studies/Results: No results  found.  Anti-infectives: Anti-infectives    Start     Dose/Rate Route Frequency Ordered Stop   06/03/14 1615  gentamicin (GARAMYCIN) IVPB 100 mg  Status:  Discontinued    Comments:  Please make hang time appropriate with when pre-op abx was given   100 mg 200 mL/hr over 30 Minutes Intravenous  Once 06/03/14 1611 06/03/14 1616   06/03/14 1615  clindamycin (CLEOCIN) IVPB 900 mg  Status:  Discontinued     900 mg 100 mL/hr over 30 Minutes Intravenous  Once 06/03/14 1611 06/03/14 1617   06/03/14 0900  gentamicin (GARAMYCIN) IVPB 100 mg     100 mg 200 mL/hr over 30 Minutes Intravenous To ShortStay Surgical 06/02/14 1111 06/03/14 1130   06/03/14 0900  clindamycin (CLEOCIN) IVPB 900 mg     900 mg 100 mL/hr over 30 Minutes Intravenous To ShortStay Surgical 06/02/14 1208 06/03/14 1130   06/02/14 1115  clindamycin (CLEOCIN) IVPB 900 mg  Status:  Discontinued     900 mg 100 mL/hr over 30 Minutes Intravenous On call to O.R. 06/02/14 1111 06/02/14 1206       Assessment/Plan   1. POD 2, s/p ex lap with partial colectomy and colostomy --> Dr. Luisa Hart -foley removed this morning -PT for mobilization -mobilize  and pulm toilet -BID dressing changes -WOC following patient for new colostomy -DC NGT today and leave NPO x ice chips -given one dose of post op abx.  No further needed at this time. Leukocytosis -likely reactive from surgery.  Will follow ARI -Creatinine up this am to 1.33.  Foley removed at 0600 per routine post op orders.  Will follow output and labs. DVT prophylaxis -heparin drip (h/o PE)/SCDs  LOS: 7 days    Hannah [name] E 06/05/2014, 8:09 AM Pager: [phone]

## 2014-06-05 NOTE — Consult Note (Addendum)
WOC ostomy consult note [facility] following for assessment and plan of care to abd wound. Stoma type/location: Colostomy to LLQ from 5/31.  She is in pain and has an NG at this time. Stomal assessment/size: Stoma dark red and viable, flush with skin level, approx 1 1/2 inches Peristomal assessment: Intact skin surrounding Output: Small amt liquid brown stool Ostomy pouching: 1pc. Education provided: Family member at bedside during teaching session. Demonstrated application of barrier ring and one piece convex pouch.Pt watched and asked appropriate questions.  She was able to open and close velcro to empty. Discussed pouching routines and ordering supplies. Educational materials left at bedside and supplies in room for staff nurse use. Recommend home health assistance after discharge.  Enrolled patient in [facility] program: Yes Cammie Mcgee MSN, RN, [place], [place], [place] [phone]

## 2014-06-05 NOTE — Progress Notes (Signed)
ANTICOAGULATION CONSULT NOTE - Follow Up Consult  Pharmacy Consult for Heparin Indication: pulmonary embolus (history)  Allergies  Allergen Reactions  . Augmentin [Amoxicillin-Pot Clavulanate] Other (See Comments)    Severe vaginal itching  . Tranxene [Clorazepate] Itching    Patient Measurements: Height: 5\' 7"  (170.2 cm) Weight: 200 lb 1.6 oz (90.765 kg) IBW/kg (Calculated) : 61.6 Heparin Dosing Weight:   Vital Signs: Temp: 98.5 F (36.9 C) (06/02 0433) Temp Source: Oral (06/02 0433) BP: 115/56 mmHg (06/02 0433) Pulse Rate: 128 (06/02 0433)  Labs:  Recent Labs  06/03/14 0620 06/04/14 0832 06/04/14 2125 06/05/14 0430 06/05/14 1140  HGB 13.4  --   --  12.3  --   HCT 41.3  --   --  37.6  --   PLT 274  --   --  178  --   LABPROT 14.1 15.5*  --  15.0  --   INR 1.07 1.21  --  1.16  --   HEPARINUNFRC  --   --  0.19* 0.23* 0.38  CREATININE  --   --   --  1.33*  --     Estimated Creatinine Clearance: 43.6 mL/min (by C-G formula based on Cr of 1.33).   Medications:  Scheduled:  . antiseptic oral rinse  7 mL Mouth Rinse q12n4p  . chlorhexidine  15 mL Mouth Rinse BID  . insulin aspart  0-20 Units Subcutaneous 6 times per day  . levothyroxine  12.5 mcg Intravenous Daily  . metoprolol  5 mg Intravenous 4 times per day    Assessment: 74yo female with hx of PE, on heparin bridge post-op.  Heparin level therapeutic at 0.38 today.  Hg and pltc are decreased, in part reflective of post-op status, and both remain wnl.  No bleeding noted.  Goal of Therapy:  Heparin level 0.3-0.7 units/ml Monitor platelets by anticoagulation protocol: Yes   Plan:  Continue Heparin 1250 units/hr Repeat Heparin level at 6PM to verify therapeutic x 2 Monitor for s/s of bleeding   Marisue Humble, PharmD Clinical Pharmacist [facility]

## 2014-06-05 NOTE — Progress Notes (Signed)
ANTICOAGULATION CONSULT NOTE - Follow Up Consult  Pharmacy Consult for Heparin Indication: pulmonary embolus (history)  Allergies  Allergen Reactions  . Augmentin [Amoxicillin-Pot Clavulanate] Other (See Comments)    Severe vaginal itching  . Tranxene [Clorazepate] Itching    Patient Measurements: Height: 5\' 7"  (170.2 cm) Weight: 200 lb 1.6 oz (90.765 kg) IBW/kg (Calculated) : 61.6 Heparin Dosing Weight: 81.5 kg  Vital Signs: Temp: 98.5 F (36.9 C) (06/02 0433) Temp Source: Oral (06/02 0433) BP: 115/56 mmHg (06/02 0433) Pulse Rate: 128 (06/02 0433)  Labs:  Recent Labs  06/02/14 1035 06/02/14 1700 06/03/14 0620 06/04/14 0832 06/04/14 2125 06/05/14 0430  HGB  --   --  13.4  --   --  12.3  HCT  --   --  41.3  --   --  37.6  PLT  --   --  274  --   --  178  LABPROT  --   --  14.1 15.5*  --  15.0  INR  --   --  1.07 1.21  --  1.16  HEPARINUNFRC  --  0.33  --   --  0.19* 0.23*  CREATININE 0.70  --   --   --   --   --     Estimated Creatinine Clearance: 72.5 mL/min (by C-G formula based on Cr of 0.7).   Medications:  Scheduled:  . antiseptic oral rinse  7 mL Mouth Rinse q12n4p  . chlorhexidine  15 mL Mouth Rinse BID  . insulin aspart  0-20 Units Subcutaneous 6 times per day  . levothyroxine  12.5 mcg Intravenous Daily  . metoprolol  5 mg Intravenous 4 times per day   Infusions:  . Marland KitchenTPN (CLINIMIX-E) Adult 80 mL/hr at 06/04/14 1819   And  . fat emulsion 240 mL (06/04/14 1819)  . sodium chloride 10 mL/hr (06/03/14 2311)  . heparin 1,250 Units/hr (06/05/14 0127)  . lactated ringers 20 mL/hr at 06/03/14 1010  . lactated ringers 10 mL/hr at 06/03/14 1015    Assessment: 74 yo F on Coumadin PTA for hx PE/DVT continues on heparin bridge therapy post-op 2/2 NPO status after recent surgery for colonic stricture with obstruction.    Heparin level remains subtherapeutic on 1250 units/hr - however heparin off from ~2400-0100 due to loss of IV site. H/H stable. plt down  to 178 - will watch. No bleeding noted.   Goal of Therapy:  Heparin level 0.3-0.7 units/ml Monitor platelets by anticoagulation protocol: Yes   Plan:  Recheck HL at 0900 (~8 hr post gtt restart)  Christoper Fabian, PharmD, BCPS Clinical pharmacist, pager [phone] 06/05/2014 6:00 AM

## 2014-06-05 NOTE — Progress Notes (Addendum)
Patients NG tube removed during transfer from chair to bed. Unknown Jim text paged.  Per [employer] note, NG tube to be removed today. CLarified with MD Janee Morn whether or not to replace NG tube. Orders to not replace NG tube at this time. Will continue to monitor.

## 2014-06-06 ENCOUNTER — Inpatient Hospital Stay (HOSPITAL_COMMUNITY): Payer: Medicare Other

## 2014-06-06 DIAGNOSIS — E871 Hypo-osmolality and hyponatremia: Secondary | ICD-10-CM

## 2014-06-06 LAB — URINALYSIS, ROUTINE W REFLEX MICROSCOPIC
BILIRUBIN URINE: NEGATIVE
GLUCOSE, UA: NEGATIVE mg/dL
Hgb urine dipstick: NEGATIVE
Ketones, ur: NEGATIVE mg/dL
Nitrite: NEGATIVE
PH: 5 (ref 5.0–8.0)
PROTEIN: NEGATIVE mg/dL
Specific Gravity, Urine: 1.014 (ref 1.005–1.030)
UROBILINOGEN UA: 1 mg/dL (ref 0.0–1.0)

## 2014-06-06 LAB — BASIC METABOLIC PANEL
Anion gap: 7 (ref 5–15)
BUN: 52 mg/dL — ABNORMAL HIGH (ref 6–20)
CO2: 24 mmol/L (ref 22–32)
CREATININE: 0.99 mg/dL (ref 0.44–1.00)
Calcium: 8.2 mg/dL — ABNORMAL LOW (ref 8.9–10.3)
Chloride: 97 mmol/L — ABNORMAL LOW (ref 101–111)
GFR calc non Af Amer: 55 mL/min — ABNORMAL LOW (ref >60)
Glucose, Bld: 121 mg/dL — ABNORMAL HIGH (ref 65–99)
Potassium: 4.3 mmol/L (ref 3.5–5.1)
SODIUM: 128 mmol/L — AB (ref 135–145)

## 2014-06-06 LAB — CBC
HCT: 32.8 % — ABNORMAL LOW (ref 36.0–46.0)
HEMOGLOBIN: 10.8 g/dL — AB (ref 12.0–15.0)
MCH: 28 pg (ref 26.0–34.0)
MCHC: 32.9 g/dL (ref 30.0–36.0)
MCV: 85 fL (ref 78.0–100.0)
PLATELETS: 174 10*3/uL (ref 150–400)
RBC: 3.86 MIL/uL — AB (ref 3.87–5.11)
RDW: 14.8 % (ref 11.5–15.5)
WBC: 18.3 10*3/uL — ABNORMAL HIGH (ref 4.0–10.5)

## 2014-06-06 LAB — HEPARIN LEVEL (UNFRACTIONATED)
HEPARIN UNFRACTIONATED: 0.48 [IU]/mL (ref 0.30–0.70)
HEPARIN UNFRACTIONATED: 0.57 [IU]/mL (ref 0.30–0.70)

## 2014-06-06 LAB — PROTIME-INR
INR: 1.17 (ref 0.00–1.49)
PROTHROMBIN TIME: 15 s (ref 11.6–15.2)

## 2014-06-06 LAB — GLUCOSE, CAPILLARY
GLUCOSE-CAPILLARY: 113 mg/dL — AB (ref 65–99)
GLUCOSE-CAPILLARY: 153 mg/dL — AB (ref 65–99)
Glucose-Capillary: 111 mg/dL — ABNORMAL HIGH (ref 65–99)
Glucose-Capillary: 115 mg/dL — ABNORMAL HIGH (ref 65–99)
Glucose-Capillary: 149 mg/dL — ABNORMAL HIGH (ref 65–99)
Glucose-Capillary: 151 mg/dL — ABNORMAL HIGH (ref 65–99)

## 2014-06-06 LAB — URINE MICROSCOPIC-ADD ON

## 2014-06-06 LAB — PHOSPHORUS: Phosphorus: 2.8 mg/dL (ref 2.5–4.6)

## 2014-06-06 LAB — MAGNESIUM: Magnesium: 1.6 mg/dL — ABNORMAL LOW (ref 1.7–2.4)

## 2014-06-06 MED ORDER — MAGNESIUM SULFATE IN D5W 10-5 MG/ML-% IV SOLN
1.0000 g | Freq: Once | INTRAVENOUS | Status: AC
  Administered 2014-06-06: 1 g via INTRAVENOUS
  Filled 2014-06-06: qty 100

## 2014-06-06 MED ORDER — M.V.I. ADULT IV INJ
INJECTION | INTRAVENOUS | Status: AC
  Administered 2014-06-06: 17:00:00 via INTRAVENOUS
  Filled 2014-06-06: qty 1920

## 2014-06-06 MED ORDER — FAT EMULSION 20 % IV EMUL
240.0000 mL | INTRAVENOUS | Status: AC
  Administered 2014-06-06: 240 mL via INTRAVENOUS
  Filled 2014-06-06: qty 250

## 2014-06-06 NOTE — Progress Notes (Signed)
Rehab Admissions Coordinator Note:  Patient was screened by Clois Dupes for appropriateness for an Inpatient Acute Rehab Consult per OT recommendation. At this time, we are recommending Skilled Nursing Facility.[employer] medicare will not approve an inpt rehab admission for this diagnosis.  Clois Dupes 06/06/2014, 6:16 PM  I can be reached at [phone].

## 2014-06-06 NOTE — Clinical Social Work Note (Signed)
Clinical Social Work Assessment  Patient Details  Name: Hannah [name] MRN: [id number] Date of Birth: 09/07/1940  Date of referral:  06/06/14               Reason for consult:  Discharge Planning, Facility Placement                Permission sought to share information with:  Facility Contact Representative, Family Supports Permission granted to share information::  Yes, Verbal Permission Granted  Name::     Hannah [name]  Agency::  SNFs  Relationship::  Daughter and son  Contact Information:     Housing/Transportation Living arrangements for the past 2 months:  Single Family Home Source of Information:  Patient Patient Interpreter Needed:  None Criminal Activity/Legal Involvement Pertinent to Current Situation/Hospitalization:  No - Comment as needed Significant Relationships:  Adult Children Lives with:  Self, Adult Children Do you feel safe going back to the place where you live?  Yes Need for family participation in patient care:  No (Coment)  Care giving concerns:  Patient's daughter does have some concern about the patient returning home at discharge as she requires a lot of assistance at this time.    Social Worker assessment / plan:  CSW was notified by PT that patient is likely going to require SNF placement at discharge. CSW met with daughter to assess situation. Per daughter the patient was living alone one week prior to coming the to hospital. The patient moved in with the daughter Hannah [name] and was then admitted to the hospital. Per Hannah [name], the patient and family were hoping for a DC home but are willing to look at SNF options. The patient has been to [facility] in the past but they are unsure what facility they would choose this time if the patient does in fact go to SNF. CSW explained SNF search/placement process and answered questions. CSW will follow up with bed offers.  Employment status:  Retired Insurance information:  Managed Medicare PT  Recommendations:  Skilled Nursing Facility Information / Referral to community resources:  Skilled Nursing Facility  Patient/Family's Response to care:  Patient and family seem to be pleased with the care the patient is receiving.  Patient/Family's Understanding of and Emotional Response to Diagnosis, Current Treatment, and Prognosis:  Patient's family has good insight into reason for admission and what the patient's post DC needs will be.   Emotional Assessment Appearance:  Appears stated age Attitude/Demeanor/Rapport:  Other (Appropriate) Affect (typically observed):  Calm Orientation:  Oriented to Self, Oriented to Place, Oriented to  Time, Oriented to Situation Alcohol / Substance use:  Tobacco Use (Hx of) Psych involvement (Current and /or in the community):  No (Comment)  Discharge Needs  Concerns to be addressed:  Discharge Planning Concerns Readmission within the last 30 days:  No Current discharge risk:  Physical Impairment Barriers to Discharge:  Continued Medical Work up   Bryant  MSW, LCSWA, LCASA, [phone] 

## 2014-06-06 NOTE — Progress Notes (Signed)
PIV pulled back and flushed without resistance. Site taped and re-flushed without resistance. I advised the primary nurse that the PIV was WNL and did not need to be restarted at this time and she could restart the continuous fluids. Consuello Masse

## 2014-06-06 NOTE — Progress Notes (Signed)
RN has offered pain medication multiple times throughout the night, pt has refused medication and states she has no pain.  Will continue to monitor.  Estanislado Emms, RN

## 2014-06-06 NOTE — Progress Notes (Signed)
Physical Therapy Treatment Patient Details Name: Hannah [name] MRN: [id number] DOB: [birth year] Today's Date: 06/06/2014    History of Present Illness Pt is a 74 year old female with a PMH of DVT s/p IVC filter, HTN, DM, and diverticulitis admitted for abdominal pain, nausea, vomiting, and sigmoid colon stricture. Pt underwent exploratory lap with colectomy and colostomy on 06/03/14    PT Comments    Pt's progress is very slow. Feel she could benefit from ST-SNF. Pt agreeable because she doesn't know if her daughter can manage her at home.  Follow Up Recommendations  SNF     Equipment Recommendations  None recommended by PT    Recommendations for Other Services       Precautions / Restrictions Precautions Precautions: Fall Precaution Comments: colostomy Restrictions Weight Bearing Restrictions: No    Mobility  Bed Mobility Overal bed mobility: Needs Assistance Bed Mobility: Rolling;Sidelying to Sit Rolling: +2 for physical assistance;Mod assist Sidelying to sit: +2 for physical assistance;Mod assist       General bed mobility comments: Assist to bring legs off bed and elevate trunk into sitting.  Transfers Overall transfer level: Needs assistance Equipment used: Rolling walker (2 wheeled) Transfers: Sit to/from Stand Sit to Stand: +2 physical assistance;Mod assist         General transfer comment: Heavy assist to bring hips up using bed pad. Pt placed hands on walker to encourage forward weight shift.  Ambulation/Gait Ambulation/Gait assistance: +2 physical assistance;Mod assist Ambulation Distance (Feet): 8 Feet Assistive device: Rolling walker (2 wheeled) Gait Pattern/deviations: Step-to pattern;Decreased step length - right;Decreased step length - left;Shuffle;Trunk flexed Gait velocity: very slow Gait velocity interpretation: Below normal speed for age/gender General Gait Details: Frequent verbal/tactile cues to stand more erect, lenghten steps, pick up feet  and stay closer to walker.   Stairs            Wheelchair Mobility    Modified Rankin (Stroke Patients Only)       Balance Overall balance assessment: Needs assistance Sitting-balance support: No upper extremity supported;Feet supported Sitting balance-Leahy Scale: Fair     Standing balance support: Bilateral upper extremity supported Standing balance-Leahy Scale: Poor Standing balance comment: walker and mod assist for static standing.                    Cognition Arousal/Alertness: Awake/alert Behavior During Therapy: Flat affect Overall Cognitive Status: Within Functional Limits for tasks assessed                      Exercises      General Comments        Pertinent Vitals/Pain Pain Assessment: Faces Faces Pain Scale: Hurts even more Pain Location: abdominal Pain Descriptors / Indicators: Grimacing;Operative site guarding Pain Intervention(s): Limited activity within patient's tolerance;Repositioned    Home Living                      Prior Function            PT Goals (current goals can now be found in the care plan section) Progress towards PT goals: Progressing toward goals (very slowly)    Frequency  Min 3X/week    PT Plan Discharge plan needs to be updated    Co-evaluation             End of Session Equipment Utilized During Treatment: Gait belt Activity Tolerance: Patient limited by fatigue Patient left: in chair;with call bell/phone within reach  Time: 9735-3299 PT Time Calculation (min) (ACUTE ONLY): 19 min  Charges:  $Gait Training: 8-22 mins                    G Codes:      Katelin Kutsch Jul 01, 2014, 11:01 AM  Chistopher Mangino PT [phone]

## 2014-06-06 NOTE — Progress Notes (Signed)
PARENTERAL NUTRITION CONSULT NOTE - FOLLOW UP  Pharmacy Consult:  TPN Indication:  Colonic stricture  Allergies  Allergen Reactions  . Augmentin [Amoxicillin-Pot Clavulanate] Other (See Comments)    Severe vaginal itching  . Tranxene [Clorazepate] Itching    Patient Measurements: Height: _0  (170.2 cm) Weight: 200 lb 1.6 oz (90.765 kg) IBW/kg (Calculated) : 61.6  Vital Signs: Temp: 99.6 F (37.6 C) (06/03 0601) Temp Source: Oral (06/03 0601) BP: 124/63 mmHg (06/03 0601) Pulse Rate: 113 (06/03 0601) Intake/Output from previous day: 06/02 0701 - 06/03 0700 In: 10 [I.V.:10] Out: -   Labs:  Recent Labs  06/04/14 0832 06/05/14 0430 06/06/14 0617  WBC  --  17.3* 18.3*  HGB  --  12.3 10.8*  HCT  --  37.6 32.8*  PLT  --  178 174  INR 1.21 1.16 1.17     Recent Labs  06/05/14 0430 06/06/14 0617  NA 128* 128*  K 4.9 4.3  CL 96* 97*  CO2 23 24  GLUCOSE 158* 121*  BUN 55* 52*  CREATININE 1.33* 0.99  CALCIUM 8.7* 8.2*  MG 1.7 1.6*  PHOS 5.0* 2.8  PROT 5.5*  --   ALBUMIN 2.2*  --   AST 27  --   ALT 29  --   ALKPHOS 59  --   BILITOT 0.9  --    Estimated Creatinine Clearance: 58.6 mL/min (by C-G formula based on Cr of 0.99).    Recent Labs  06/06/14 0012 06/06/14 0409 06/06/14 0751  GLUCAP 153* 111* 115*     Insulin Requirements in the past 24 hours:  23 units moderate SSI + 70 units regular insulin in TPN  Assessment: 74 YOF found to have a colonic stricture with obstruction on colonoscopy 05/30/14.  Currently in the OR for sigmoid colectomy and possible colostomy.  She continues on TPN for nutritional support.  Surgeries/Procedures:      *5/27: colonoscopy showed colonic stricture found    *5/31: partial colectomy and colostomy  GI: Prealbumin 11.8 >> 13.  No colostomy O/P. NGT d/c'd 6/2 and leave NPO for now  Endo: hypothyroid on IV Synthroid.  DM - CBGs uncontrolled 111-205 - using less SSI, likely due to stress response post OR  5/31  Lytes: Na stable 128, Phos decreased 5>>2.8, Mg low 1.6, Cl low 97  Renal: SCr back down 1.33>>0.9, CrCL 59 ml/min - UOP inaccurate. NS_1 +LR_2 . Foley removed 6/2  Cards: HTN / HLD - BP mostly controlled, tachy 110s - IV Lopressor  Hepatobil: LFTs wnl, alk phos, tbili remain wnl, TG WNL  ID: afebrile, WBC up to 18.3 - like acute phase post-surg  Anticoag: hx DVT/PE s/p IVC filter, Coumadin PTA >> heparin gtt  Best Practices: heparin gtt, MC TPN Access: PICC line placed 05/30/14 TPN start date: 05/30/14  Current Nutrition:  Clinimix NO LYTES 5/15 _3 /h + IVFE _4 /h - to provide 1843kcal + 96g protein  Nutritional Goals:  1850-2050 kCal, 85-95g protein per day   Plan:  - Change back to Clinimix E 5/15 with lytes improved at goal 80 ml/hr + IVFE at 10 ml/hr to provide 1843kcal and 96g protein (100% of pt's estimated needs) - Daily multivitamin and trace elements - thiamine 341PF and folic acid 38m added to TPN bag - Decrease insulin in TPN to 75 units + continue SSI - Mg sulfate 1g x1 - F/U AM labs, CBGs    HElicia Lamp PharmD Clinical Pharmacist - Resident Pager [phone]/03/2014 11:04 AM

## 2014-06-06 NOTE — Progress Notes (Signed)
Patient ID: Hannah [name], female   DOB: [birth year], 74 y.o.   MRN: [id number]     [address] [place], [place] [address]    Phone: [phone] FAX: [phone]     Subjective: [place].  Dressing dated AM 6/2.  WBC up.  Temp 99.6.  No vomiting, but had nausea earlier.  No ostomy output.    Objective:  Vital signs:  Filed Vitals:   06/05/14 1338 06/05/14 1457 06/05/14 1725 06/06/14 0601  BP: 129/83 122/67 129/56 124/63  Pulse: 138 119 110 113  Temp: 99.1 F (37.3 C) 99.3 F (37.4 C) 99.1 F (37.3 C) 99.6 F (37.6 C)  TempSrc: Oral Oral Oral Oral  Resp: _0 Height:      Weight:      SpO2:  94% 97% 95%    Last BM Date: 06/05/14  Intake/Output   Yesterday:  06/02 0701 - 06/03 0700 In: 10 [I.V.:10] Out: -  This shift:    I/O last 3 completed shifts: In: 10 [I.V.:10] Out: 700 [Urine:200; Emesis/NG output:350; Stool:150] Total I/O In: 0  Out: 800 [Urine:800]     Physical Exam: General: Pt awake/alert/oriented x4 in no acute distress Abdomen: Soft.  Nondistended.  Mildly tender at incisions only.  Wound with green tinged drainage, fascia is intact, replaced.  Stoma is pink and viable, no output.   No evidence of peritonitis.  No incarcerated hernias.    Problem List:   Principal Problem:   Colonic stricture Active Problems:   Long term current use of anticoagulant therapy   Hypothyroid   Hypertension   Diabetes mellitus with neurological manifestations, controlled   History of pulmonary embolism   Dehydration   Acute renal failure    Results:   Labs: Results for orders placed or performed during the hospital encounter of 05/29/14 (from the past 48 hour(s))  Glucose, capillary     Status: Abnormal   Collection Time: 06/04/14 12:01 PM  Result Value Ref Range   Glucose-Capillary 215 (H) 65 - 99 mg/dL  Glucose, capillary     Status: Abnormal   Collection Time: 06/04/14   4:00 PM  Result Value Ref Range   Glucose-Capillary 247 (H) 65 - 99 mg/dL  Glucose, capillary     Status: Abnormal   Collection Time: 06/04/14  8:27 PM  Result Value Ref Range   Glucose-Capillary 242 (H) 65 - 99 mg/dL   Comment 1 Notify RN    Comment 2 Document in Chart   Heparin level (unfractionated)     Status: Abnormal   Collection Time: 06/04/14  9:25 PM  Result Value Ref Range   Heparin Unfractionated 0.19 (L) 0.30 - 0.70 IU/mL    Comment:        IF HEPARIN RESULTS ARE BELOW EXPECTED VALUES, AND PATIENT DOSAGE HAS BEEN CONFIRMED, SUGGEST FOLLOW UP TESTING OF ANTITHROMBIN III LEVELS.   Glucose, capillary     Status: Abnormal   Collection Time: 06/05/14 12:37 AM  Result Value Ref Range   Glucose-Capillary 172 (H) 65 - 99 mg/dL  Protime-INR     Status: None   Collection Time: 06/05/14  4:30 AM  Result Value Ref Range   Prothrombin Time 15.0 11.6 - 15.2 seconds   INR 1.16 0.00 - 1.49  Comprehensive metabolic panel     Status: Abnormal   Collection Time: 06/05/14  4:30 AM  Result Value Ref Range  Sodium 128 (L) 135 - 145 mmol/L   Potassium 4.9 3.5 - 5.1 mmol/L   Chloride 96 (L) 101 - 111 mmol/L   CO2 23 22 - 32 mmol/L   Glucose, Bld 158 (H) 65 - 99 mg/dL   BUN 55 (H) 6 - 20 mg/dL   Creatinine, Ser 1.33 (H) 0.44 - 1.00 mg/dL   Calcium 8.7 (L) 8.9 - 10.3 mg/dL   Total Protein 5.5 (L) 6.5 - 8.1 g/dL   Albumin 2.2 (L) 3.5 - 5.0 g/dL   AST 27 15 - 41 U/L   ALT 29 14 - 54 U/L   Alkaline Phosphatase 59 38 - 126 U/L   Total Bilirubin 0.9 0.3 - 1.2 mg/dL   GFR calc non Af Amer 39 (L) >60 mL/min   GFR calc Af Amer 45 (L) >60 mL/min    Comment: (NOTE) The eGFR has been calculated using the CKD EPI equation. This calculation has not been validated in all clinical situations. eGFR's persistently <60 mL/min signify possible Chronic Kidney Disease.    Anion gap 9 5 - 15  Magnesium     Status: None   Collection Time: 06/05/14  4:30 AM  Result Value Ref Range   Magnesium  1.7 1.7 - 2.4 mg/dL  Phosphorus     Status: Abnormal   Collection Time: 06/05/14  4:30 AM  Result Value Ref Range   Phosphorus 5.0 (H) 2.5 - 4.6 mg/dL  CBC     Status: Abnormal   Collection Time: 06/05/14  4:30 AM  Result Value Ref Range   WBC 17.3 (H) 4.0 - 10.5 K/uL   RBC 4.37 3.87 - 5.11 MIL/uL   Hemoglobin 12.3 12.0 - 15.0 g/dL   HCT 37.6 36.0 - 46.0 %   MCV 86.0 78.0 - 100.0 fL   MCH 28.1 26.0 - 34.0 pg   MCHC 32.7 30.0 - 36.0 g/dL   RDW 14.8 11.5 - 15.5 %   Platelets 178 150 - 400 K/uL  Heparin level (unfractionated)     Status: Abnormal   Collection Time: 06/05/14  4:30 AM  Result Value Ref Range   Heparin Unfractionated 0.23 (L) 0.30 - 0.70 IU/mL    Comment:        IF HEPARIN RESULTS ARE BELOW EXPECTED VALUES, AND PATIENT DOSAGE HAS BEEN CONFIRMED, SUGGEST FOLLOW UP TESTING OF ANTITHROMBIN III LEVELS.   Glucose, capillary     Status: Abnormal   Collection Time: 06/05/14  4:30 AM  Result Value Ref Range   Glucose-Capillary 133 (H) 65 - 99 mg/dL  Glucose, capillary     Status: Abnormal   Collection Time: 06/05/14  8:08 AM  Result Value Ref Range   Glucose-Capillary 195 (H) 65 - 99 mg/dL  Heparin level (unfractionated)     Status: None   Collection Time: 06/05/14 11:40 AM  Result Value Ref Range   Heparin Unfractionated 0.38 0.30 - 0.70 IU/mL    Comment:        IF HEPARIN RESULTS ARE BELOW EXPECTED VALUES, AND PATIENT DOSAGE HAS BEEN CONFIRMED, SUGGEST FOLLOW UP TESTING OF ANTITHROMBIN III LEVELS.   Glucose, capillary     Status: Abnormal   Collection Time: 06/05/14 11:43 AM  Result Value Ref Range   Glucose-Capillary 205 (H) 65 - 99 mg/dL  Glucose, capillary     Status: Abnormal   Collection Time: 06/05/14  4:47 PM  Result Value Ref Range   Glucose-Capillary 171 (H) 65 - 99 mg/dL  Glucose, capillary  Status: Abnormal   Collection Time: 06/05/14  8:12 PM  Result Value Ref Range   Glucose-Capillary 178 (H) 65 - 99 mg/dL  Heparin level (unfractionated)      Status: Abnormal   Collection Time: 06/05/14  9:00 PM  Result Value Ref Range   Heparin Unfractionated 0.19 (L) 0.30 - 0.70 IU/mL    Comment:        IF HEPARIN RESULTS ARE BELOW EXPECTED VALUES, AND PATIENT DOSAGE HAS BEEN CONFIRMED, SUGGEST FOLLOW UP TESTING OF ANTITHROMBIN III LEVELS.   Glucose, capillary     Status: Abnormal   Collection Time: 06/06/14 12:12 AM  Result Value Ref Range   Glucose-Capillary 153 (H) 65 - 99 mg/dL  Glucose, capillary     Status: Abnormal   Collection Time: 06/06/14  4:09 AM  Result Value Ref Range   Glucose-Capillary 111 (H) 65 - 99 mg/dL  Basic metabolic panel     Status: Abnormal   Collection Time: 06/06/14  6:17 AM  Result Value Ref Range   Sodium 128 (L) 135 - 145 mmol/L   Potassium 4.3 3.5 - 5.1 mmol/L   Chloride 97 (L) 101 - 111 mmol/L   CO2 24 22 - 32 mmol/L   Glucose, Bld 121 (H) 65 - 99 mg/dL   BUN 52 (H) 6 - 20 mg/dL   Creatinine, Ser 0.99 0.44 - 1.00 mg/dL   Calcium 8.2 (L) 8.9 - 10.3 mg/dL   GFR calc non Af Amer 55 (L) >60 mL/min   GFR calc Af Amer >60 >60 mL/min    Comment: (NOTE) The eGFR has been calculated using the CKD EPI equation. This calculation has not been validated in all clinical situations. eGFR's persistently <60 mL/min signify possible Chronic Kidney Disease.    Anion gap 7 5 - 15  Protime-INR     Status: None   Collection Time: 06/06/14  6:17 AM  Result Value Ref Range   Prothrombin Time 15.0 11.6 - 15.2 seconds   INR 1.17 0.00 - 1.49  CBC     Status: Abnormal   Collection Time: 06/06/14  6:17 AM  Result Value Ref Range   WBC 18.3 (H) 4.0 - 10.5 K/uL   RBC 3.86 (L) 3.87 - 5.11 MIL/uL   Hemoglobin 10.8 (L) 12.0 - 15.0 g/dL   HCT 32.8 (L) 36.0 - 46.0 %   MCV 85.0 78.0 - 100.0 fL   MCH 28.0 26.0 - 34.0 pg   MCHC 32.9 30.0 - 36.0 g/dL   RDW 14.8 11.5 - 15.5 %   Platelets 174 150 - 400 K/uL  Phosphorus     Status: None   Collection Time: 06/06/14  6:17 AM  Result Value Ref Range   Phosphorus 2.8 2.5  - 4.6 mg/dL  Magnesium     Status: Abnormal   Collection Time: 06/06/14  6:17 AM  Result Value Ref Range   Magnesium 1.6 (L) 1.7 - 2.4 mg/dL  Heparin level (unfractionated)     Status: None   Collection Time: 06/06/14  6:17 AM  Result Value Ref Range   Heparin Unfractionated 0.48 0.30 - 0.70 IU/mL    Comment:        IF HEPARIN RESULTS ARE BELOW EXPECTED VALUES, AND PATIENT DOSAGE HAS BEEN CONFIRMED, SUGGEST FOLLOW UP TESTING OF ANTITHROMBIN III LEVELS.   Glucose, capillary     Status: Abnormal   Collection Time: 06/06/14  7:51 AM  Result Value Ref Range   Glucose-Capillary 115 (H) 65 - 99 mg/dL  Imaging / Studies: Dg Chest Port 1 View  06/06/2014   CLINICAL DATA:  Fever, history of DVT, pulmonary embolism, hypertension, diabetes  EXAM: PORTABLE CHEST - 1 VIEW  COMPARISON:  Portable exam 0855 hours compared 08/29/2012  FINDINGS: RIGHT arm PICC line tip projects over cavoatrial junction.  Enlargement of cardiac silhouette with pulmonary vascular congestion.  Atherosclerotic calcification aorta.  Chronic elevation RIGHT diaphragm.  Decreased lung volumes with RIGHT basilar atelectasis.  No definite infiltrate, pleural effusion or pneumothorax.  IMPRESSION: Decreased lung volumes with RIGHT basilar atelectasis.  Enlargement of cardiac silhouette with pulmonary vascular congestion.   Electronically Signed   By: Lavonia Dana M.D.   On: 06/06/2014 09:07    Medications / Allergies:  Scheduled Meds: . antiseptic oral rinse  7 mL Mouth Rinse q12n4p  . chlorhexidine  15 mL Mouth Rinse BID  . insulin aspart  0-20 Units Subcutaneous 6 times per day  . levothyroxine  12.5 mcg Intravenous Daily  . metoprolol  5 mg Intravenous 4 times per day   Continuous Infusions: . Marland KitchenTPN (CLINIMIX-E) Adult     And  . fat emulsion    . sodium chloride 75 mL/hr at 06/06/14 0844  . TPN (CLINIMIX) Adult without lytes 80 mL/hr at 06/05/14 1714   And  . fat emulsion 240 mL (06/05/14 1714)  . heparin 1,400  Units/hr (06/06/14 0314)  . lactated ringers 10 mL/hr at 06/03/14 1015   PRN Meds:.hydrALAZINE, HYDROmorphone (DILAUDID) injection, HYDROmorphone (DILAUDID) injection, ondansetron (ZOFRAN) IV, promethazine, sodium chloride  Antibiotics: Anti-infectives    Start     Dose/Rate Route Frequency Ordered Stop   06/03/14 1615  gentamicin (GARAMYCIN) IVPB 100 mg  Status:  Discontinued    Comments:  Please make hang time appropriate with when pre-op abx was given   100 mg 200 mL/hr over 30 Minutes Intravenous  Once 06/03/14 1611 06/03/14 1616   06/03/14 1615  clindamycin (CLEOCIN) IVPB 900 mg  Status:  Discontinued     900 mg 100 mL/hr over 30 Minutes Intravenous  Once 06/03/14 1611 06/03/14 1617   06/03/14 0900  gentamicin (GARAMYCIN) IVPB 100 mg     100 mg 200 mL/hr over 30 Minutes Intravenous To [facility] 06/02/14 1111 06/03/14 1130   06/03/14 0900  clindamycin (CLEOCIN) IVPB 900 mg     900 mg 100 mL/hr over 30 Minutes Intravenous To [facility] 06/02/14 1208 06/03/14 1130   06/02/14 1115  clindamycin (CLEOCIN) IVPB 900 mg  Status:  Discontinued     900 mg 100 mL/hr over 30 Minutes Intravenous On call to O.R. 06/02/14 1111 06/02/14 1206      Assessment/Plan POD 3, s/p ex lap with partial colectomy and colostomy --> Dr. Brantley Stage -allow for sip of clears -PT for mobilization -mobilize and pulm toilet -wound is not clean, not being changed BID, will increase to TID -WOC following patient for new colostomy Leukocytosis -WBC increased, T max 99.6.  Primary team working up.  I think it's still too early to scan abdomen, will discuss DVT prophylaxis -heparin drip (h/o PE)/SCDs FEN-TNA until ileus resolves   Erby Pian, [facility] Pager [phone]) For consults and floor pages call [phone](7A-4:30P)  06/06/2014 9:43 AM

## 2014-06-06 NOTE — Progress Notes (Addendum)
ANTICOAGULATION CONSULT NOTE - Follow Up Consult  Pharmacy Consult for heparin Indication: hx of PE and DVT  Allergies  Allergen Reactions  . Augmentin [Amoxicillin-Pot Clavulanate] Other (See Comments)    Severe vaginal itching  . Tranxene [Clorazepate] Itching    Patient Measurements: Height: 5\' 7"  (170.2 cm) Weight: 200 lb 1.6 oz (90.765 kg) IBW/kg (Calculated) : 61.6 Heparin Dosing Weight:   Vital Signs: Temp: 99.6 F (37.6 C) (06/03 0601) Temp Source: Oral (06/03 0601) BP: 124/63 mmHg (06/03 0601) Pulse Rate: 113 (06/03 0601)  Labs:  Recent Labs  06/04/14 0832  06/05/14 0430 06/05/14 1140 06/05/14 2100 06/06/14 0617  HGB  --   --  12.3  --   --  10.8*  HCT  --   --  37.6  --   --  32.8*  PLT  --   --  178  --   --  174  LABPROT 15.5*  --  15.0  --   --  15.0  INR 1.21  --  1.16  --   --  1.17  HEPARINUNFRC  --   < > 0.23* 0.38 0.19* 0.48  CREATININE  --   --  1.33*  --   --  0.99  < > = values in this interval not displayed.  Estimated Creatinine Clearance: 58.6 mL/min (by C-G formula based on Cr of 0.99).   Medications:  Scheduled:  . antiseptic oral rinse  7 mL Mouth Rinse q12n4p  . chlorhexidine  15 mL Mouth Rinse BID  . insulin aspart  0-20 Units Subcutaneous 6 times per day  . levothyroxine  12.5 mcg Intravenous Daily  . metoprolol  5 mg Intravenous 4 times per day   Infusions:  . Marland KitchenTPN (CLINIMIX-E) Adult     And  . fat emulsion    . sodium chloride 10 mL/hr at 06/05/14 2257  . TPN (CLINIMIX) Adult without lytes 80 mL/hr at 06/05/14 1714   And  . fat emulsion 240 mL (06/05/14 1714)  . heparin 1,400 Units/hr (06/06/14 0314)  . lactated ringers 10 mL/hr at 06/03/14 1015    Assessment: 74 yo female with hx of PE/DVT s/p ex lap is currently on therapeutic heparin.  Heparin level is 0.57.  Goal of Therapy:  Heparin level 0.3-0.7 units/ml Monitor platelets by anticoagulation protocol: Yes   Plan:  Continue Heparin 1250 units/hr Daily  HL/CBC Monitor s/sx of bleeding F/u resume of coumadin   Kaylene Dawn, [facility] 06/06/2014,7:53 AM

## 2014-06-06 NOTE — Progress Notes (Signed)
Occupational Therapy Evaluation Patient Details Name: Hannah [name] MRN: [id number] DOB: [birth year] Today's Date: 06/06/2014    History of Present Illness Pt is a 74 year old female with a PMH of DVT s/p IVC filter, HTN, DM, and diverticulitis admitted for abdominal pain, nausea, vomiting, and sigmoid colon stricture. Pt underwent exploratory lap with colectomy and colostomy on 06/03/14   Clinical Impression   PTA, pt lived alone and was independent with ADL and mobility. Pt currently requires Max A with mobility and ADL. Supportive family. Pt very motivated to return to PLOF. Feel pt is excellent CIR candidate. Will follow acutely to maximize functional level of independence and facilitate D/C to next venue of care.     Follow Up Recommendations  CIR;Supervision/Assistance - 24 hour    Equipment Recommendations  3 in 1 bedside comode    Recommendations for Other Services Rehab consult     Precautions / Restrictions Precautions Precautions: Fall Precaution Comments: colostomy      Mobility Bed Mobility Overal bed mobility: Needs Assistance Bed Mobility: Rolling;Sidelying to Sit;Sit to Supine Rolling: Mod assist Sidelying to sit: Mod assist   Sit to supine: Max assist   General bed mobility comments: improved performance from earlier session  Transfers Overall transfer level: Needs assistance   Transfers: Sit to/from Stand Sit to Stand: Max assist; mod A 2nd and 3rd trial         General transfer comment: unable to achieve full upright standing    Balance Overall balance assessment: Needs assistance   Sitting balance-Leahy Scale: Fair     Standing balance support: Bilateral upper extremity supported;During functional activity Standing balance-Leahy Scale: Poor                              ADL Overall ADL's : Needs assistance/impaired Eating/Feeding: NPO   Grooming: Set up;Supervision/safety   Upper Body Bathing: Minimal assitance;Bed level    Lower Body Bathing: Maximal assistance;Sit to/from stand   Upper Body Dressing : Moderate assistance   Lower Body Dressing: Maximal assistance   Toilet Transfer: +2 for physical assistance;Maximal assistance   Toileting- Clothing Manipulation and Hygiene: Total assistance Toileting - Clothing Manipulation Details (indicate cue type and reason): incontinenet of urine     Functional mobility during ADLs: Maximal assistance General ADL Comments: Slow processing. Increased time for mobility. Pt states she wants to be independent again. Max A for mobility to EOB.Marland KitchenAssisted with cleaning after incontinence.                     Pertinent Vitals/Pain Pain Assessment: Faces Pain Score: 9  Pain Location: stomach Pain Descriptors / Indicators: Discomfort;Grimacing Pain Intervention(s): Limited activity within patient's tolerance;Monitored during session     Hand Dominance     Extremity/Trunk Assessment Upper Extremity Assessment Upper Extremity Assessment: Generalized weakness   Lower Extremity Assessment Lower Extremity Assessment: Generalized weakness   Cervical / Trunk Assessment Cervical / Trunk Assessment: Kyphotic (? due to abdominal pain)   Communication Communication Communication: No difficulties   Cognition Arousal/Alertness: Awake/alert Behavior During Therapy: Flat affect Overall Cognitive Status: Impaired/Different from baseline Area of Impairment: Attention;Problem solving;Awareness   Current Attention Level: Selective Memory: Decreased short-term memory     Awareness: Emergent Problem Solving: Slow processing;Difficulty sequencing;Requires verbal cues;Requires tactile cues General Comments: related to pain meds?   General Comments       Exercises Exercises: Other exercises Other Exercises Other Exercises: BUE general AROM "climbing ladder"  bed level   Shoulder Instructions      Home Living Family/patient expects to be discharged to:: Private  residence Living Arrangements: Alone Available Help at Discharge: Family;Available PRN/intermittently Type of Home: House Home Access: Stairs to enter [employer] of Steps: 1 Entrance Stairs-Rails: Can reach both Home Layout: One level     Bathroom Shower/Tub: Tub/shower unit Shower/tub characteristics: Curtain Firefighter: Standard Bathroom Accessibility: No   Home Equipment: Environmental consultant - 4 wheels;Tub bench;Cane - single point          Prior Functioning/Environment Level of Independence: Independent             OT Diagnosis: Generalized weakness;Acute pain   OT Problem List: Decreased strength;Decreased range of motion;Decreased activity tolerance;Impaired balance (sitting and/or standing);Decreased safety awareness;Decreased knowledge of use of DME or AE;Obesity;Pain   OT Treatment/Interventions: Self-care/ADL training;Therapeutic exercise;Energy conservation;DME and/or AE instruction;Therapeutic activities;Patient/family education;Balance training    OT Goals(Current goals can be found in the care plan section) Acute Rehab OT Goals Patient Stated Goal: to be independent OT Goal Formulation: With patient Time For Goal Achievement: 06/20/14 Potential to Achieve Goals: Good ADL Goals Pt Will Perform Upper Body Bathing: with set-up;with supervision;sitting Pt Will Perform Lower Body Bathing: with min assist;sit to/from stand Pt Will Perform Upper Body Dressing: with set-up;sitting Pt Will Perform Lower Body Dressing: with min assist;with adaptive equipment;sit to/from stand Pt Will Transfer to Toilet: with min guard assist;ambulating;bedside commode Pt Will Perform Toileting - Clothing Manipulation and hygiene: with supervision;sit to/from stand  OT Frequency: Min 2X/week   Barriers to D/C: Decreased caregiver support          Co-evaluation              End of Session Nurse Communication: Mobility status  Activity Tolerance: Patient tolerated  treatment well Patient left: in bed;with call bell/phone within reach;with family/visitor present   Time: 2500-3704 OT Time Calculation (min): 36 min Charges:  OT General Charges $OT Visit: 1 Procedure OT Evaluation $Initial OT Evaluation Tier I: 1 Procedure OT Treatments $Self Care/Home Management : 8-22 mins G-Codes:    Chanc Kervin,HILLARY 2014-06-08, 4:37 PM   [facility], OTR/L  [phone] 2014/06/08

## 2014-06-06 NOTE — Progress Notes (Signed)
[facility] PROGRESS NOTE  Hannah [name] VJD:[id number] DOB: 01-22-1940 DOA: 05/29/2014  PCP: [name]., DO  Brief HPI:  74 yo female w/ history DM, diverticulosis, DVT/PE on chronic Coumadin, HTN, and dyslipidemia. Presented w/ intractable N/V in setting of recent dx colonic stricture. Pt had presented to ER on multiple occasions for abdominal pain that has progressively worsened and has become assoc with N/V. Sx's began one week ago; CT abdomen 5/20 revealed stricture at descending and sigmoid colon. Plan was for pt to check w/ PCP re OK to hold Coumadin for colonoscopy then proceed as OP. On 5/21 INR was 5.49 so she was instructed to hold her Coumadin. Unfortunately her sx's worsened and she came to ER. On colonoscopy, she was found to have stricture so surgery was consulted.She underwent exploratory laparotomy with partial colectomy and colostomy on May 31.   Past medical history:  Past Medical History  Diagnosis Date  . Thyroid disease   . DVT (deep venous thrombosis)   . Acute massive pulmonary embolism 07/13/2012    Massive PE w/ PEA arrest 07/13/12 >TNK >IVC filter >discharged on comadin    . Cardiogenic shock 07/13/2012  . Hemorrhagic shock 07/15/2012  . Acute renal failure 07/14/2012  . Acute respiratory failure with hypoxia 07/14/2012  . Hyperlipemia 02/25/2013  . Hypertension 06/06/2013  . Diabetes mellitus with neurological manifestations, controlled 06/06/2013    Consultants: Gen. Surgery, gastroenterology  Procedures:  Flexible sigmoidoscopy May 27 She underwent exploratory laparotomy with partial colectomy and colostomy on May 31  Antibiotics: None  Subjective: Patient's NG tube came out last night. Has not been replaced. She denies any worsening abdominal pain. Denies any nausea or vomiting.  Objective: Vital Signs  Filed Vitals:   06/05/14 1338 06/05/14 1457 06/05/14 1725 06/06/14 0601  BP: 129/83 122/67 129/56 124/63  Pulse: 138 119 110 113  Temp:  99.1 F (37.3 C) 99.3 F (37.4 C) 99.1 F (37.3 C) 99.6 F (37.6 C)  TempSrc: Oral Oral Oral Oral  Resp: 18 16 18 18   Height:      Weight:      SpO2:  94% 97% 95%    Intake/Output Summary (Last 24 hours) at 06/06/14 5189 Last data filed at 06/05/14 2124  Gross per 24 hour  Intake     10 ml  Output      0 ml  Net     10 ml   Filed Weights   05/29/14 1733  Weight: 90.765 kg (200 lb 1.6 oz)    General appearance: alert, cooperative, appears stated age, no distress and moderately obese Resp: clear to auscultation bilaterally Cardio: regular rate and rhythm, S1, S2 normal, no murmur, click, rub or gallop GI: Most of the abdomen covered with dressing. There is ostomy bag noted on the left. No stool today. It appears that the bag was recently changed. Bowel sounds heard, minimal. Extremities: extremities normal, atraumatic, no cyanosis or edema Neurologic: No focal deficits.  Lab Results:  Basic Metabolic Panel:  Recent Labs Lab 05/31/14 0608 06/01/14 0525 06/02/14 1035 06/05/14 0430 06/06/14 0617  NA 135 132* 135 128* 128*  K 3.0* 3.2* 3.5 4.9 4.3  CL 102 96* 99* 96* 97*  CO2 26 25 27 23 24   GLUCOSE 179* 215* 172* 158* 121*  BUN 8 10 16  55* 52*  CREATININE 0.70 0.72 0.70 1.33* 0.99  CALCIUM 8.0* 8.1* 8.6* 8.7* 8.2*  MG 1.2* 1.7 1.6* 1.7 1.6*  PHOS 2.1* 3.2 3.1 5.0* 2.8  Liver Function Tests:  Recent Labs Lab 05/31/14 0608 06/02/14 1035 06/05/14 0430  AST 12* 14* 27  ALT 9* 12* 29  ALKPHOS 37* 44 59  BILITOT 0.8 0.6 0.9  PROT 5.0* 5.8* 5.5*  ALBUMIN 2.6* 2.8* 2.2*   CBC:  Recent Labs Lab 05/31/14 0608 06/01/14 0525 06/02/14 0530 06/03/14 0620 06/05/14 0430 06/06/14 0617  WBC 5.6 7.9 9.3 11.5* 17.3* 18.3*  NEUTROABS 2.4  --  5.1  --   --   --   HGB 13.0 13.8 13.9 13.4 12.3 10.8*  HCT 40.3 41.3 42.7 41.3 37.6 32.8*  MCV 86.9 85.9 85.9 86.4 86.0 85.0  PLT 256 272 292 274 178 174   CBG:  Recent Labs Lab 06/05/14 1647 06/05/14 2012  06/06/14 0012 06/06/14 0409 06/06/14 0751  GLUCAP 171* 178* 153* 111* 115*    Recent Results (from the past 240 hour(s))  Surgical pcr screen     Status: None   Collection Time: 06/02/14  8:54 PM  Result Value Ref Range Status   MRSA, PCR NEGATIVE NEGATIVE Final   Staphylococcus aureus NEGATIVE NEGATIVE Final    Comment:        The Xpert SA Assay (FDA approved for NASAL specimens in patients over 26 years of age), is one component of a comprehensive surveillance program.  Test performance has been validated by [facility] for patients greater than or equal to 61 year old. It is not intended to diagnose infection nor to guide or monitor treatment.       Studies/Results: No results found.  Medications:  Scheduled: . antiseptic oral rinse  7 mL Mouth Rinse q12n4p  . chlorhexidine  15 mL Mouth Rinse BID  . insulin aspart  0-20 Units Subcutaneous 6 times per day  . levothyroxine  12.5 mcg Intravenous Daily  . metoprolol  5 mg Intravenous 4 times per day   Continuous: . Marland KitchenTPN (CLINIMIX-E) Adult     And  . fat emulsion    . sodium chloride 10 mL/hr at 06/05/14 2257  . TPN (CLINIMIX) Adult without lytes 80 mL/hr at 06/05/14 1714   And  . fat emulsion 240 mL (06/05/14 1714)  . heparin 1,400 Units/hr (06/06/14 0314)  . lactated ringers 10 mL/hr at 06/03/14 1015   ZOX:[id number], HYDROmorphone (DILAUDID) injection, HYDROmorphone (DILAUDID) injection, ondansetron (ZOFRAN) IV, promethazine, sodium chloride  Assessment/Plan:  Principal Problem:   Colonic stricture Active Problems:   Long term current use of anticoagulant therapy   Hypothyroid   Hypertension   Diabetes mellitus with neurological manifestations, controlled   History of pulmonary embolism   Dehydration   Acute renal failure    Sigmoid colon stricture Now status post partial colectomy and colostomy. General surgery is following. Initially seen by GI and underwent flexible sigmoidoscopy which showed  sigmoid colon stricture. Subsequently Surgery was consulted and the patient underwent surgery. General surgery is following and managing.   Dehydration/Acute renal failure/hyponatremia Sodium level is stable. Creatinine has improved with increase in IV fluids yesterday. We'll continue for today. Continue TPN as well. Repeat labs in the morning.   Low Grade Fever.Leukocytosis UA is unremarkable for infection. Chest x-ray shows only atelectasis. Discussed with general surgery PA and she mentioned that the wound does look dirty with greenish discharge. This could be the reason for her low-grade fever. They will address this. We'll continue to monitor for now off antibiotics. If she starts spiking fever or WBC continues to increase, may have to scan her abdomen.  Sinus tachycardia  Has been persistent for the last many days. She does not appear to be symptomatic. She is on beta blockers around the clock. Continue to monitor.  History of pulmonary embolism on anticoagulation with warfarin She was diagnosed with a submassive pulmonary embolism in July 2014. She actually had a PEA arrest and required thrombolytics. Considering this patient was placed on heparin bridge. She is currently off warfarin for the surgery. When okay with surgery and when she is able to take orally, warfarin to be resumed.  Hypothyroidism She is on Intravenous levothyroxin.  History of essential Hypertension PRN hydralazine and intravenous beta blocker  Diabetes mellitus with neurological manifestations, controlled SSI q 4 while NPO  Hypokalemia Repleted. Continue to monitor.  DVT Prophylaxis: On IV heparin    Code Status: Full code  Family Communication: No family at bedside today. Discussed with son yesterday. Disposition Plan: Continue current care. Physical and occupational therapy is following. SNF was recommended.     LOS: 8 days   [facility]  [facility] Pager [phone] 06/06/2014, 8:37 AM  If  7PM-7AM, please contact night-coverage at [email or web address], password [facility]

## 2014-06-06 NOTE — Progress Notes (Signed)
ANTICOAGULATION CONSULT NOTE - Follow Up Consult  Pharmacy Consult for heparin Indication: pulmonary embolus   Labs:  Recent Labs  06/04/14 0832  06/05/14 0430 06/05/14 1140 06/05/14 2100 06/06/14 0617  HGB  --   --  12.3  --   --  10.8*  HCT  --   --  37.6  --   --  32.8*  PLT  --   --  178  --   --  174  LABPROT 15.5*  --  15.0  --   --  15.0  INR 1.21  --  1.16  --   --  1.17  HEPARINUNFRC  --   < > 0.23* 0.38 0.19* 0.48  CREATININE  --   --  1.33*  --   --   --   < > = values in this interval not displayed.   Assessment/Plan:  74yo female therapeutic on heparin after rate change. Will continue gtt at current rate and confirm stable with additional level.   Vernard Gambles, PharmD, BCPS  06/06/2014,6:59 AM

## 2014-06-07 ENCOUNTER — Inpatient Hospital Stay (HOSPITAL_COMMUNITY): Payer: Medicare Other

## 2014-06-07 ENCOUNTER — Encounter (HOSPITAL_COMMUNITY): Payer: Self-pay | Admitting: Radiology

## 2014-06-07 LAB — BASIC METABOLIC PANEL
Anion gap: 9 (ref 5–15)
BUN: 37 mg/dL — ABNORMAL HIGH (ref 6–20)
CO2: 26 mmol/L (ref 22–32)
Calcium: 8.4 mg/dL — ABNORMAL LOW (ref 8.9–10.3)
Chloride: 97 mmol/L — ABNORMAL LOW (ref 101–111)
Creatinine, Ser: 0.85 mg/dL (ref 0.44–1.00)
GFR calc Af Amer: >60 mL/min (ref >60)
GFR calc non Af Amer: >60 mL/min (ref >60)
Glucose, Bld: 128 mg/dL — ABNORMAL HIGH (ref 65–99)
Potassium: 4.1 mmol/L (ref 3.5–5.1)
SODIUM: 132 mmol/L — AB (ref 135–145)

## 2014-06-07 LAB — LACTIC ACID, PLASMA: LACTIC ACID, VENOUS: 1.3 mmol/L (ref 0.5–2.0)

## 2014-06-07 LAB — GLUCOSE, CAPILLARY
GLUCOSE-CAPILLARY: 102 mg/dL — AB (ref 65–99)
GLUCOSE-CAPILLARY: 126 mg/dL — AB (ref 65–99)
GLUCOSE-CAPILLARY: 139 mg/dL — AB (ref 65–99)
GLUCOSE-CAPILLARY: 155 mg/dL — AB (ref 65–99)
Glucose-Capillary: 98 mg/dL (ref 65–99)
Glucose-Capillary: 110 mg/dL — ABNORMAL HIGH (ref 65–99)

## 2014-06-07 LAB — CBC
HCT: 31.6 % — ABNORMAL LOW (ref 36.0–46.0)
Hemoglobin: 10.3 g/dL — ABNORMAL LOW (ref 12.0–15.0)
MCH: 27.5 pg (ref 26.0–34.0)
MCHC: 32.6 g/dL (ref 30.0–36.0)
MCV: 84.3 fL (ref 78.0–100.0)
PLATELETS: 207 10*3/uL (ref 150–400)
RBC: 3.75 MIL/uL — ABNORMAL LOW (ref 3.87–5.11)
RDW: 14.8 % (ref 11.5–15.5)
WBC: 20.5 10*3/uL — AB (ref 4.0–10.5)

## 2014-06-07 LAB — PROTIME-INR
INR: 1.2 (ref 0.00–1.49)
Prothrombin Time: 15.3 seconds — ABNORMAL HIGH (ref 11.6–15.2)

## 2014-06-07 LAB — PHOSPHORUS: Phosphorus: 3.2 mg/dL (ref 2.5–4.6)

## 2014-06-07 LAB — MAGNESIUM: Magnesium: 1.8 mg/dL (ref 1.7–2.4)

## 2014-06-07 LAB — HEPARIN LEVEL (UNFRACTIONATED): Heparin Unfractionated: 0.55 IU/mL (ref 0.30–0.70)

## 2014-06-07 LAB — CLOSTRIDIUM DIFFICILE BY PCR: CDIFFPCR: NEGATIVE

## 2014-06-07 MED ORDER — IOHEXOL 300 MG/ML  SOLN
100.0000 mL | Freq: Once | INTRAMUSCULAR | Status: AC | PRN
  Administered 2014-06-07: 100 mL via INTRAVENOUS

## 2014-06-07 MED ORDER — TRACE MINERALS CR-CU-F-FE-I-MN-MO-SE-ZN IV SOLN
INTRAVENOUS | Status: AC
  Administered 2014-06-07: 17:00:00 via INTRAVENOUS
  Filled 2014-06-07: qty 1920

## 2014-06-07 MED ORDER — DAKINS (1/4 STRENGTH) 0.125 % EX SOLN
Freq: Every day | CUTANEOUS | Status: AC
  Administered 2014-06-07 – 2014-06-09 (×3)
  Filled 2014-06-07 (×2): qty 473

## 2014-06-07 MED ORDER — FAT EMULSION 20 % IV EMUL
240.0000 mL | INTRAVENOUS | Status: AC
  Administered 2014-06-07: 240 mL via INTRAVENOUS
  Filled 2014-06-07: qty 250

## 2014-06-07 MED ORDER — PIPERACILLIN-TAZOBACTAM 3.375 G IVPB
3.3750 g | Freq: Three times a day (TID) | INTRAVENOUS | Status: DC
  Administered 2014-06-07 – 2014-06-12 (×16): 3.375 g via INTRAVENOUS
  Filled 2014-06-07 (×18): qty 50

## 2014-06-07 MED ORDER — MAGNESIUM SULFATE 2 GM/50ML IV SOLN
2.0000 g | Freq: Once | INTRAVENOUS | Status: AC
  Administered 2014-06-07: 2 g via INTRAVENOUS
  Filled 2014-06-07: qty 50

## 2014-06-07 MED ORDER — IOHEXOL 300 MG/ML  SOLN
25.0000 mL | INTRAMUSCULAR | Status: AC
  Administered 2014-06-07: 25 mL via ORAL

## 2014-06-07 NOTE — Progress Notes (Signed)
ANTICOAGULATION CONSULT NOTE - Follow Up Consult  Pharmacy Consult for heparin Indication: hx of PE and DVT  Allergies  Allergen Reactions  . Augmentin [Amoxicillin-Pot Clavulanate] Other (See Comments)    Severe vaginal itching  . Tranxene [Clorazepate] Itching    Patient Measurements: Height: 5\' 7"  (170.2 cm) Weight: 200 lb 1.6 oz (90.765 kg) IBW/kg (Calculated) : 61.6 Heparin Dosing Weight:   Vital Signs: Temp: 99.8 F (37.7 C) (06/04 0517) Temp Source: Oral (06/04 0517) BP: 146/75 mmHg (06/04 0517) Pulse Rate: 110 (06/04 0517)  Labs:  Recent Labs  06/05/14 0430  06/06/14 0617 06/06/14 1220 06/07/14 0522  HGB 12.3  --  10.8*  --  10.3*  HCT 37.6  --  32.8*  --  31.6*  PLT 178  --  174  --  207  LABPROT 15.0  --  15.0  --  15.3*  INR 1.16  --  1.17  --  1.20  HEPARINUNFRC 0.23*  < > 0.48 0.57 0.55  CREATININE 1.33*  --  0.99  --  0.85  < > = values in this interval not displayed.  Estimated Creatinine Clearance: 68.2 mL/min (by C-G formula based on Cr of 0.85).   Medications:  Scheduled:  . antiseptic oral rinse  7 mL Mouth Rinse q12n4p  . chlorhexidine  15 mL Mouth Rinse BID  . insulin aspart  0-20 Units Subcutaneous 6 times per day  . iohexol  25 mL Oral Q1 Hr x 2  . levothyroxine  12.5 mcg Intravenous Daily  . magnesium sulfate 1 - 4 g bolus IVPB  2 g Intravenous Once  . metoprolol  5 mg Intravenous 4 times per day  . piperacillin-tazobactam (ZOSYN)  IV  3.375 g Intravenous 3 times per day  . sodium hypochlorite   Irrigation Daily   Infusions:  . Marland KitchenTPN (CLINIMIX-E) Adult 80 mL/hr at 06/06/14 1712   And  . fat emulsion 240 mL (06/06/14 1712)  . Marland KitchenTPN (CLINIMIX-E) Adult     And  . fat emulsion    . heparin 1,400 Units/hr (06/06/14 2252)  . lactated ringers 10 mL/hr at 06/03/14 1015    Assessment: 74 yo female with hx of PE and DVT is currently on therapeutic heparin.  Heparin level today is 0.55.  CBC is stable.  Goal of Therapy:  Heparin level  0.3-0.7 units/ml Monitor platelets by anticoagulation protocol: Yes   Plan:  Continue Heparin 1250 units/hr Daily HL/CBC Monitor s/sx of bleeding F/u resume of coumadin (when ok w/ surgery and taking PO well per MD)  Kinta Martis, Tsz-Yin 06/07/2014,10:22 AM

## 2014-06-07 NOTE — Progress Notes (Signed)
Utilization Review completed. Teven Mittman RN BSN CM 

## 2014-06-07 NOTE — Progress Notes (Addendum)
[facility] PROGRESS NOTE  Hannah [name] ZOX:[id number] DOB: December 30, 1940 DOA: 05/29/2014  PCP: [name]., DO  Brief HPI:  74 yo female w/ history DM, diverticulosis, DVT/PE on chronic Coumadin, HTN, and dyslipidemia. Presented w/ intractable N/V in setting of recent dx colonic stricture. Pt had presented to ER on multiple occasions for abdominal pain that has progressively worsened and has become assoc with N/V. Sx's began one week ago; CT abdomen 5/20 revealed stricture at descending and sigmoid colon. Plan was for pt to check w/ PCP re OK to hold Coumadin for colonoscopy then proceed as OP. On 5/21 INR was 5.49 so she was instructed to hold her Coumadin. Unfortunately her sx's worsened and she came to ER. On colonoscopy, she was found to have stricture so surgery was consulted.She underwent exploratory laparotomy with partial colectomy and colostomy on May 31. Subsequently, patient started developing low-grade fever and her WBC started increasing.  Past medical history:  Past Medical History  Diagnosis Date  . Thyroid disease   . DVT (deep venous thrombosis)   . Acute massive pulmonary embolism 07/13/2012    Massive PE w/ PEA arrest 07/13/12 >TNK >IVC filter >discharged on comadin    . Cardiogenic shock 07/13/2012  . Hemorrhagic shock 07/15/2012  . Acute renal failure 07/14/2012  . Acute respiratory failure with hypoxia 07/14/2012  . Hyperlipemia 02/25/2013  . Hypertension 06/06/2013  . Diabetes mellitus with neurological manifestations, controlled 06/06/2013    Consultants: Gen. Surgery, gastroenterology  Procedures:  Flexible sigmoidoscopy May 27 She underwent exploratory laparotomy with partial colectomy and colostomy on May 31  Antibiotics: Zosyn initiated 6/4  Subjective: Patient feels nauseous this morning. Denies any pain.  Objective: Vital Signs  Filed Vitals:   06/06/14 1332 06/06/14 2209 06/06/14 2214 06/07/14 0517  BP: 127/66  139/55 146/75  Pulse: 117 112  113 110  Temp: 99.6 F (37.6 C)  99.8 F (37.7 C) 99.8 F (37.7 C)  TempSrc: Oral  Oral Oral  Resp: Height:      Weight:      SpO2: 97% 94% 94% 96%    Intake/Output Summary (Last 24 hours) at 06/07/14 8119 Last data filed at 06/07/14 0730  Gross per 24 hour  Intake      0 ml  Output   1010 ml  Net  -1010 ml   Filed Weights   05/29/14 1733  Weight: 90.765 kg (200 lb 1.6 oz)    General appearance: alert, cooperative, appears stated age, no distress and moderately obese Resp: clear to auscultation bilaterally Cardio: regular rate and rhythm, S1, S2 normal, no murmur, click, rub or gallop GI: Most of the abdomen covered with dressing. Per the surgery PA the wound has a yellowish green exudate. There is ostomy bag noted on the left. Bowel sounds heard, minimal. Extremities: extremities normal, atraumatic, no cyanosis or edema Neurologic: No focal deficits.  Lab Results:  Basic Metabolic Panel:  Recent Labs Lab 06/01/14 0525 06/02/14 1035 06/05/14 0430 06/06/14 0617 06/07/14 0522  NA 132* 135 128* 128* 132*  K 3.2* 3.5 4.9 4.3 4.1  CL 96* 99* 96* 97* 97*  CO2 GLUCOSE 215* 172* 158* 121* 128*  BUN 10 16 55* 52* 37*  CREATININE 0.72 0.70 1.33* 0.99 0.85  CALCIUM 8.1* 8.6* 8.7* 8.2* 8.4*  MG 1.7 1.6* 1.7 1.6* 1.8  PHOS 3.2 3.1 5.0* 2.8 3.2   Liver Function Tests:  Recent Labs Lab 06/02/14 1035  06/05/14 0430  AST 14* 27  ALT 12* 29  ALKPHOS 44 59  BILITOT 0.6 0.9  PROT 5.8* 5.5*  ALBUMIN 2.8* 2.2*   CBC:  Recent Labs Lab 06/02/14 0530 06/03/14 0620 06/05/14 0430 06/06/14 0617 06/07/14 0522  WBC 9.3 11.5* 17.3* 18.3* 20.5*  NEUTROABS 5.1  --   --   --   --   HGB 13.9 13.4 12.3 10.8* 10.3*  HCT 42.7 41.3 37.6 32.8* 31.6*  MCV 85.9 86.4 86.0 85.0 84.3  PLT 292 274 178 174 207   CBG:  Recent Labs Lab 06/06/14 1727 06/06/14 2020 06/07/14 0027 06/07/14 0510 06/07/14 0749  GLUCAP 113* 151* 155* 126* 139*    Recent  Results (from the past 240 hour(s))  Surgical pcr screen     Status: None   Collection Time: 06/02/14  8:54 PM  Result Value Ref Range Status   MRSA, PCR NEGATIVE NEGATIVE Final   Staphylococcus aureus NEGATIVE NEGATIVE Final    Comment:        The Xpert SA Assay (FDA approved for NASAL specimens in patients over 70 years of age), is one component of a comprehensive surveillance program.  Test performance has been validated by [facility] for patients greater than or equal to 63 year old. It is not intended to diagnose infection nor to guide or monitor treatment.       Studies/Results: Dg Chest Port 1 View  06/06/2014   CLINICAL DATA:  Fever, history of DVT, pulmonary embolism, hypertension, diabetes  EXAM: PORTABLE CHEST - 1 VIEW  COMPARISON:  Portable exam 0855 hours compared 08/29/2012  FINDINGS: RIGHT arm PICC line tip projects over cavoatrial junction.  Enlargement of cardiac silhouette with pulmonary vascular congestion.  Atherosclerotic calcification aorta.  Chronic elevation RIGHT diaphragm.  Decreased lung volumes with RIGHT basilar atelectasis.  No definite infiltrate, pleural effusion or pneumothorax.  IMPRESSION: Decreased lung volumes with RIGHT basilar atelectasis.  Enlargement of cardiac silhouette with pulmonary vascular congestion.   Electronically Signed   By: Ulyses Southward M.D.   On: 06/06/2014 09:07    Medications:  Scheduled: . antiseptic oral rinse  7 mL Mouth Rinse q12n4p  . chlorhexidine  15 mL Mouth Rinse BID  . insulin aspart  0-20 Units Subcutaneous 6 times per day  . levothyroxine  12.5 mcg Intravenous Daily  . magnesium sulfate 1 - 4 g bolus IVPB  2 g Intravenous Once  . metoprolol  5 mg Intravenous 4 times per day   Continuous: . Marland KitchenTPN (CLINIMIX-E) Adult 80 mL/hr at 06/06/14 1712   And  . fat emulsion 240 mL (06/06/14 1712)  . Marland KitchenTPN (CLINIMIX-E) Adult     And  . fat emulsion    . heparin 1,400 Units/hr (06/06/14 2252)  . lactated ringers 10 mL/hr  at 06/03/14 1015   WUJ:[id number], HYDROmorphone (DILAUDID) injection, HYDROmorphone (DILAUDID) injection, ondansetron (ZOFRAN) IV, promethazine, sodium chloride  Assessment/Plan:  Principal Problem:   Colonic stricture Active Problems:   Long term current use of anticoagulant therapy   Hypothyroid   Hypertension   Diabetes mellitus with neurological manifestations, controlled   History of pulmonary embolism   Dehydration   Acute renal failure    Sigmoid colon stricture Now status post partial colectomy and colostomy. General surgery is following. Initially seen by GI and underwent flexible sigmoidoscopy which showed sigmoid colon stricture. Subsequently Surgery was consulted and the patient underwent surgery. General surgery is following and managing.   Dehydration/Acute renal failure/hyponatremia Sodium level is improved. Creatinine  has improved. Hold off on further IV fluids. Continue just TPN. Repeat labs in the morning.   Low Grade Fever/Leukocytosis/Possible wound infection WBC continues to rise. Continues to have low-grade fever. UA is unremarkable for infection. Chest x-ray shows only atelectasis. This could be surgical wound infection. Discussed with general surgery PA and a CT abdomen will be ordered today. Obtain blood cultures, lactic acid. We will initiate Zosyn for now. No evidence for diarrhea. We'll also send stool sample to rule out C. difficile.   Sinus tachycardia Has been persistent for the last many days. She does not appear to be symptomatic. She is on beta blockers around the clock. Continue to monitor.  History of pulmonary embolism on anticoagulation with warfarin She was diagnosed with a submassive pulmonary embolism in July 2014. She actually had a PEA arrest and required thrombolytics. Considering this patient was placed on heparin bridge. She is currently off warfarin for the surgery. When okay with surgery and when she is able to take orally, warfarin to  be resumed.  Hypothyroidism She is on Intravenous levothyroxin.  History of essential Hypertension PRN hydralazine and intravenous beta blocker  Diabetes mellitus with neurological manifestations, controlled SSI q 4 while NPO  Hypokalemia Repleted. Continue to monitor.  DVT Prophylaxis: On IV heparin    Code Status: Full code  Family Communication: No family at bedside today. Tried calling Son and daughter today. Daughter, Ms. Susann Givens called back. Disposition Plan:  Await Ct abdomen. Started Zosyn. Physical and occupational therapy is following. SNF was recommended.     LOS: 9 days   [facility]  [facility] Pager [phone] 06/07/2014, 9:06 AM  If 7PM-7AM, please contact night-coverage at [email or web address], password [facility]

## 2014-06-07 NOTE — Progress Notes (Addendum)
PARENTERAL NUTRITION CONSULT NOTE - FOLLOW UP  Pharmacy Consult:  TPN Indication:  Colonic stricture  Allergies  Allergen Reactions  . Augmentin [Amoxicillin-Pot Clavulanate] Other (See Comments)    Severe vaginal itching  . Tranxene [Clorazepate] Itching    Patient Measurements: Height: _0  (170.2 cm) Weight: 200 lb 1.6 oz (90.765 kg) IBW/kg (Calculated) : 61.6  Vital Signs: Temp: 99.8 F (37.7 C) (06/04 0517) Temp Source: Oral (06/04 0517) BP: 146/75 mmHg (06/04 0517) Pulse Rate: 110 (06/04 0517) Intake/Output from previous day: 06/03 0701 - 06/04 0700 In: 0  Out: 1010 [Urine:800; Emesis/NG output:200; Stool:10]  Labs:  Recent Labs  06/05/14 0430 06/06/14 0617 06/07/14 0522  WBC 17.3* 18.3* 20.5*  HGB 12.3 10.8* 10.3*  HCT 37.6 32.8* 31.6*  PLT 178 174 207  INR 1.16 1.17 1.20     Recent Labs  06/05/14 0430 06/06/14 0617 06/07/14 0522  NA 128* 128* 132*  K 4.9 4.3 4.1  CL 96* 97* 97*  CO2 _1 GLUCOSE 158* 121* 128*  BUN 55* 52* 37*  CREATININE 1.33* 0.99 0.85  CALCIUM 8.7* 8.2* 8.4*  MG 1.7 1.6* 1.8  PHOS 5.0* 2.8 3.2  PROT 5.5*  --   --   ALBUMIN 2.2*  --   --   AST 27  --   --   ALT 29  --   --   ALKPHOS 59  --   --   BILITOT 0.9  --   --    Estimated Creatinine Clearance: 68.2 mL/min (by C-G formula based on Cr of 0.85).    Recent Labs  06/07/14 0027 06/07/14 0510 06/07/14 0749  GLUCAP 155* 126* 139*     Insulin Requirements in the past 24 hours:  11 units moderate SSI + 75 units regular insulin in TPN  Assessment: 74 YOF found to have a colonic stricture with obstruction on colonoscopy 05/30/14.  S/p sigmoid colectomy and possible colostomy on 06/03/14.  She continues on TPN for nutritional support.  Surgeries/Procedures:      *5/27: colonoscopy showed colonic stricture found    *5/31: partial colectomy and colostomy GI: Prealbumin 11.8 >> 13.  Minimal colostomy and NG output Endo: hypothyroid on IV Synthroid.  DM -  CBGs better controlled Lytes: Na improved to 132, low CL, others WNL Renal: SCr decreased to 0.85, CrCL 68 ml/min - UOP 0.4 ml/kg/hr (?accurate) Cards: HTN / HLD - BP trending up, tachy 110s - IV Lopressor Hepatobil: LFTs wnl, alk phos, tbili remain wnl, TG WNL ID: afebrile, WBC up to 20.5 - like acute phase post-surg Anticoag: hx DVT/PE s/p IVC filter, Coumadin PTA >> heparin gtt Best Practices: heparin gtt, MC TPN Access: PICC line placed 05/30/14 TPN start date: 05/30/14  Current Nutrition:  Clinimix Sips with meds  Nutritional Goals:  1850-2050 kCal, 85-95g protein per day   Plan:  - Continue Clinimix E 5/15 at goal rate of 80 ml/hr + IVFE at 10 ml/hr to provide 1843 kCal and 96g protein per day.  TPN meeting 100% of patient's needs - Daily multivitamin and trace elements - Thiamine 010XN and folic acid 50m added to TPN - Continue with 75 units regular insulin in TPN + SSI - Mag sulfate 2gm IV x 1, f/u labs on Mon - F/U daily    Jemal Miskell D. DMina Marble PharmD, BCPS Pager:  [phone]/04/2014, 8:52 AM

## 2014-06-07 NOTE — Progress Notes (Signed)
Patient ID: Hannah [name], female   DOB: 1940-05-13, 74 y.o.   MRN: [id number]     [facility]      [address] [place], [place] [address]    Phone: [phone] FAX: [phone]     Subjective: WBC continues to increase.  Ostomy working, but pt apparently vomited brown feculent output.    Objective:  Vital signs:  Filed Vitals:   06/06/14 1332 06/06/14 2209 06/06/14 2214 06/07/14 0517  BP: 127/66  139/55 146/75  Pulse: 117 112 113 110  Temp: 99.6 F (37.6 C)  99.8 F (37.7 C) 99.8 F (37.7 C)  TempSrc: Oral  Oral Oral  Resp: _0 Height:      Weight:      SpO2: 97% 94% 94% 96%    Last BM Date: 06/07/14  Intake/Output   Yesterday:  06/03 0701 - 06/04 0700 In: 0  Out: 1010 [Urine:800; Emesis/NG output:200; Stool:10] This shift:    I/O last 3 completed shifts: In: 10 [I.V.:10] Out: 1010 [Urine:800; Emesis/NG output:200; Stool:10]     Physical Exam: General: Pt awake/alert/oriented x4 in no acute distress Abdomen: Soft.  Nondistended.Mildly tender at incisions only. Midline wound with yellow/green, not bile adhered to the wound.  The fascia is intact.  Colostomy is functioning. No evidence of peritonitis.  No incarcerated hernias.   Problem List:   Principal Problem:   Colonic stricture Active Problems:   Long term current use of anticoagulant therapy   Hypothyroid   Hypertension   Diabetes mellitus with neurological manifestations, controlled   History of pulmonary embolism   Dehydration   Acute renal failure    Results:   Labs: Results for orders placed or performed during the hospital encounter of 05/29/14 (from the past 48 hour(s))  Heparin level (unfractionated)     Status: None   Collection Time: 06/05/14 11:40 AM  Result Value Ref Range   Heparin Unfractionated 0.38 0.30 - 0.70 IU/mL    Comment:        IF HEPARIN RESULTS ARE BELOW EXPECTED VALUES, AND PATIENT DOSAGE HAS BEEN  CONFIRMED, SUGGEST FOLLOW UP TESTING OF ANTITHROMBIN III LEVELS.   Glucose, capillary     Status: Abnormal   Collection Time: 06/05/14 11:43 AM  Result Value Ref Range   Glucose-Capillary 205 (H) 65 - 99 mg/dL  Glucose, capillary     Status: Abnormal   Collection Time: 06/05/14  4:47 PM  Result Value Ref Range   Glucose-Capillary 171 (H) 65 - 99 mg/dL  Glucose, capillary     Status: Abnormal   Collection Time: 06/05/14  8:12 PM  Result Value Ref Range   Glucose-Capillary 178 (H) 65 - 99 mg/dL  Heparin level (unfractionated)     Status: Abnormal   Collection Time: 06/05/14  9:00 PM  Result Value Ref Range   Heparin Unfractionated 0.19 (L) 0.30 - 0.70 IU/mL    Comment:        IF HEPARIN RESULTS ARE BELOW EXPECTED VALUES, AND PATIENT DOSAGE HAS BEEN CONFIRMED, SUGGEST FOLLOW UP TESTING OF ANTITHROMBIN III LEVELS.   Glucose, capillary     Status: Abnormal   Collection Time: 06/06/14 12:12 AM  Result Value Ref Range   Glucose-Capillary 153 (H) 65 - 99 mg/dL  Glucose, capillary     Status: Abnormal   Collection Time: 06/06/14  4:09 AM  Result Value Ref Range   Glucose-Capillary 111 (H) 65 - 99 mg/dL  Basic metabolic panel  Status: Abnormal   Collection Time: 06/06/14  6:17 AM  Result Value Ref Range   Sodium 128 (L) 135 - 145 mmol/L   Potassium 4.3 3.5 - 5.1 mmol/L   Chloride 97 (L) 101 - 111 mmol/L   CO2 24 22 - 32 mmol/L   Glucose, Bld 121 (H) 65 - 99 mg/dL   BUN 52 (H) 6 - 20 mg/dL   Creatinine, Ser 0.99 0.44 - 1.00 mg/dL   Calcium 8.2 (L) 8.9 - 10.3 mg/dL   GFR calc non Af Amer 55 (L) >60 mL/min   GFR calc Af Amer >60 >60 mL/min    Comment: (NOTE) The eGFR has been calculated using the CKD EPI equation. This calculation has not been validated in all clinical situations. eGFR's persistently <60 mL/min signify possible Chronic Kidney Disease.    Anion gap 7 5 - 15  Protime-INR     Status: None   Collection Time: 06/06/14  6:17 AM  Result Value Ref Range    Prothrombin Time 15.0 11.6 - 15.2 seconds   INR 1.17 0.00 - 1.49  CBC     Status: Abnormal   Collection Time: 06/06/14  6:17 AM  Result Value Ref Range   WBC 18.3 (H) 4.0 - 10.5 K/uL   RBC 3.86 (L) 3.87 - 5.11 MIL/uL   Hemoglobin 10.8 (L) 12.0 - 15.0 g/dL   HCT 32.8 (L) 36.0 - 46.0 %   MCV 85.0 78.0 - 100.0 fL   MCH 28.0 26.0 - 34.0 pg   MCHC 32.9 30.0 - 36.0 g/dL   RDW 14.8 11.5 - 15.5 %   Platelets 174 150 - 400 K/uL  Phosphorus     Status: None   Collection Time: 06/06/14  6:17 AM  Result Value Ref Range   Phosphorus 2.8 2.5 - 4.6 mg/dL  Magnesium     Status: Abnormal   Collection Time: 06/06/14  6:17 AM  Result Value Ref Range   Magnesium 1.6 (L) 1.7 - 2.4 mg/dL  Heparin level (unfractionated)     Status: None   Collection Time: 06/06/14  6:17 AM  Result Value Ref Range   Heparin Unfractionated 0.48 0.30 - 0.70 IU/mL    Comment:        IF HEPARIN RESULTS ARE BELOW EXPECTED VALUES, AND PATIENT DOSAGE HAS BEEN CONFIRMED, SUGGEST FOLLOW UP TESTING OF ANTITHROMBIN III LEVELS.   Glucose, capillary     Status: Abnormal   Collection Time: 06/06/14  7:51 AM  Result Value Ref Range   Glucose-Capillary 115 (H) 65 - 99 mg/dL  Urinalysis, Routine w reflex microscopic (not at [facility])     Status: Abnormal   Collection Time: 06/06/14  9:45 AM  Result Value Ref Range   Color, Urine YELLOW YELLOW   APPearance CLOUDY (A) CLEAR   Specific Gravity, Urine 1.014 1.005 - 1.030   pH 5.0 5.0 - 8.0   Glucose, UA NEGATIVE NEGATIVE mg/dL   Hgb urine dipstick NEGATIVE NEGATIVE   Bilirubin Urine NEGATIVE NEGATIVE   Ketones, ur NEGATIVE NEGATIVE mg/dL   Protein, ur NEGATIVE NEGATIVE mg/dL   Urobilinogen, UA 1.0 0.0 - 1.0 mg/dL   Nitrite NEGATIVE NEGATIVE   Leukocytes, UA SMALL (A) NEGATIVE  Urine microscopic-add on     Status: Abnormal   Collection Time: 06/06/14  9:45 AM  Result Value Ref Range   Squamous Epithelial / LPF MANY (A) RARE   WBC, UA 0-2 <3 WBC/hpf   Bacteria, UA RARE RARE   Glucose, capillary  Status: Abnormal   Collection Time: 06/06/14 11:57 AM  Result Value Ref Range   Glucose-Capillary 149 (H) 65 - 99 mg/dL  Heparin level (unfractionated)     Status: None   Collection Time: 06/06/14 12:20 PM  Result Value Ref Range   Heparin Unfractionated 0.57 0.30 - 0.70 IU/mL    Comment:        IF HEPARIN RESULTS ARE BELOW EXPECTED VALUES, AND PATIENT DOSAGE HAS BEEN CONFIRMED, SUGGEST FOLLOW UP TESTING OF ANTITHROMBIN III LEVELS.   Glucose, capillary     Status: Abnormal   Collection Time: 06/06/14  5:27 PM  Result Value Ref Range   Glucose-Capillary 113 (H) 65 - 99 mg/dL  Glucose, capillary     Status: Abnormal   Collection Time: 06/06/14  8:20 PM  Result Value Ref Range   Glucose-Capillary 151 (H) 65 - 99 mg/dL  Glucose, capillary     Status: Abnormal   Collection Time: 06/07/14 12:27 AM  Result Value Ref Range   Glucose-Capillary 155 (H) 65 - 99 mg/dL  Glucose, capillary     Status: Abnormal   Collection Time: 06/07/14  5:10 AM  Result Value Ref Range   Glucose-Capillary 126 (H) 65 - 99 mg/dL  CBC     Status: Abnormal   Collection Time: 06/07/14  5:22 AM  Result Value Ref Range   WBC 20.5 (H) 4.0 - 10.5 K/uL   RBC 3.75 (L) 3.87 - 5.11 MIL/uL   Hemoglobin 10.3 (L) 12.0 - 15.0 g/dL   HCT 31.6 (L) 36.0 - 46.0 %   MCV 84.3 78.0 - 100.0 fL   MCH 27.5 26.0 - 34.0 pg   MCHC 32.6 30.0 - 36.0 g/dL   RDW 14.8 11.5 - 15.5 %   Platelets 207 150 - 400 K/uL  Heparin level (unfractionated)     Status: None   Collection Time: 06/07/14  5:22 AM  Result Value Ref Range   Heparin Unfractionated 0.55 0.30 - 0.70 IU/mL    Comment:        IF HEPARIN RESULTS ARE BELOW EXPECTED VALUES, AND PATIENT DOSAGE HAS BEEN CONFIRMED, SUGGEST FOLLOW UP TESTING OF ANTITHROMBIN III LEVELS.   Basic metabolic panel     Status: Abnormal   Collection Time: 06/07/14  5:22 AM  Result Value Ref Range   Sodium 132 (L) 135 - 145 mmol/L   Potassium 4.1 3.5 - 5.1 mmol/L    Chloride 97 (L) 101 - 111 mmol/L   CO2 26 22 - 32 mmol/L   Glucose, Bld 128 (H) 65 - 99 mg/dL   BUN 37 (H) 6 - 20 mg/dL   Creatinine, Ser 0.85 0.44 - 1.00 mg/dL   Calcium 8.4 (L) 8.9 - 10.3 mg/dL   GFR calc non Af Amer >60 >60 mL/min   GFR calc Af Amer >60 >60 mL/min    Comment: (NOTE) The eGFR has been calculated using the CKD EPI equation. This calculation has not been validated in all clinical situations. eGFR's persistently <60 mL/min signify possible Chronic Kidney Disease.    Anion gap 9 5 - 15  Magnesium     Status: None   Collection Time: 06/07/14  5:22 AM  Result Value Ref Range   Magnesium 1.8 1.7 - 2.4 mg/dL  Phosphorus     Status: None   Collection Time: 06/07/14  5:22 AM  Result Value Ref Range   Phosphorus 3.2 2.5 - 4.6 mg/dL  Protime-INR     Status: Abnormal   Collection Time: 06/07/14  5:22 AM  Result Value Ref Range   Prothrombin Time 15.3 (H) 11.6 - 15.2 seconds   INR 1.20 0.00 - 1.49  Glucose, capillary     Status: Abnormal   Collection Time: 06/07/14  7:49 AM  Result Value Ref Range   Glucose-Capillary 139 (H) 65 - 99 mg/dL    Imaging / Studies: Dg Chest Port 1 View  06/06/2014   CLINICAL DATA:  Fever, history of DVT, pulmonary embolism, hypertension, diabetes  EXAM: PORTABLE CHEST - 1 VIEW  COMPARISON:  Portable exam 0855 hours compared 08/29/2012  FINDINGS: RIGHT arm PICC line tip projects over cavoatrial junction.  Enlargement of cardiac silhouette with pulmonary vascular congestion.  Atherosclerotic calcification aorta.  Chronic elevation RIGHT diaphragm.  Decreased lung volumes with RIGHT basilar atelectasis.  No definite infiltrate, pleural effusion or pneumothorax.  IMPRESSION: Decreased lung volumes with RIGHT basilar atelectasis.  Enlargement of cardiac silhouette with pulmonary vascular congestion.   Electronically Signed   By: Lavonia Dana M.D.   On: 06/06/2014 09:07    Medications / Allergies:  Scheduled Meds: . antiseptic oral rinse  7 mL Mouth  Rinse q12n4p  . chlorhexidine  15 mL Mouth Rinse BID  . insulin aspart  0-20 Units Subcutaneous 6 times per day  . levothyroxine  12.5 mcg Intravenous Daily  . magnesium sulfate 1 - 4 g bolus IVPB  2 g Intravenous Once  . metoprolol  5 mg Intravenous 4 times per day   Continuous Infusions: . Marland KitchenTPN (CLINIMIX-E) Adult 80 mL/hr at 06/06/14 1712   And  . fat emulsion 240 mL (06/06/14 1712)  . Marland KitchenTPN (CLINIMIX-E) Adult     And  . fat emulsion    . heparin 1,400 Units/hr (06/06/14 2252)  . lactated ringers 10 mL/hr at 06/03/14 1015   PRN Meds:.hydrALAZINE, HYDROmorphone (DILAUDID) injection, HYDROmorphone (DILAUDID) injection, ondansetron (ZOFRAN) IV, promethazine, sodium chloride  Antibiotics: Anti-infectives    Start     Dose/Rate Route Frequency Ordered Stop   06/03/14 1615  gentamicin (GARAMYCIN) IVPB 100 mg  Status:  Discontinued    Comments:  Please make hang time appropriate with when pre-op abx was given   100 mg 200 mL/hr over 30 Minutes Intravenous  Once 06/03/14 1611 06/03/14 1616   06/03/14 1615  clindamycin (CLEOCIN) IVPB 900 mg  Status:  Discontinued     900 mg 100 mL/hr over 30 Minutes Intravenous  Once 06/03/14 1611 06/03/14 1617   06/03/14 0900  gentamicin (GARAMYCIN) IVPB 100 mg     100 mg 200 mL/hr over 30 Minutes Intravenous To [facility] 06/02/14 1111 06/03/14 1130   06/03/14 0900  clindamycin (CLEOCIN) IVPB 900 mg     900 mg 100 mL/hr over 30 Minutes Intravenous To [facility] 06/02/14 1208 06/03/14 1130   06/02/14 1115  clindamycin (CLEOCIN) IVPB 900 mg  Status:  Discontinued     900 mg 100 mL/hr over 30 Minutes Intravenous On call to O.R. 06/02/14 1111 06/02/14 1206       Assessment/Plan POD 4, s/p ex lap with partial colectomy and colostomy --> Dr. Brantley Stage -allow for sip of clears -PT for mobilization -mobilize and pulm toilet(468m on IS only) -add dakin's to help clean up the wound, does not need to be cultured -WOC following patient  for new colostomy Leukocytosis -WBC continues to increase will proceed with CT of abdomen to evaluate for intra-abdominal surgical complications. DVT prophylaxis -heparin drip (h/o PE)/SCDs FEN-TNA until ileus resolves   EErby Pian ANP-BC [facility]  06/07/2014 9:37 AM

## 2014-06-07 NOTE — Progress Notes (Signed)
ANTIBIOTIC CONSULT NOTE - INITIAL  Pharmacy Consult for zosyn Indication: wound infection  Allergies  Allergen Reactions  . Augmentin [Amoxicillin-Pot Clavulanate] Other (See Comments)    Severe vaginal itching  . Tranxene [Clorazepate] Itching    Patient Measurements: Height: 5\' 7"  (170.2 cm) Weight: 200 lb 1.6 oz (90.765 kg) IBW/kg (Calculated) : 61.6 Adjusted Body Weight:   Vital Signs: Temp: 99.8 F (37.7 C) (06/04 0517) Temp Source: Oral (06/04 0517) BP: 146/75 mmHg (06/04 0517) Pulse Rate: 110 (06/04 0517) Intake/Output from previous day: 06/03 0701 - 06/04 0700 In: 0  Out: 1010 [Urine:800; Emesis/NG output:200; Stool:10] Intake/Output from this shift:    Labs:  Recent Labs  06/05/14 0430 06/06/14 0617 06/07/14 0522  WBC 17.3* 18.3* 20.5*  HGB 12.3 10.8* 10.3*  PLT 178 174 207  CREATININE 1.33* 0.99 0.85   Estimated Creatinine Clearance: 68.2 mL/min (by C-G formula based on Cr of 0.85). No results for input(s): VANCOTROUGH, VANCOPEAK, VANCORANDOM, GENTTROUGH, GENTPEAK, GENTRANDOM, TOBRATROUGH, TOBRAPEAK, TOBRARND, AMIKACINPEAK, AMIKACINTROU, AMIKACIN in the last 72 hours.   Microbiology: Recent Results (from the past 720 hour(s))  Surgical pcr screen     Status: None   Collection Time: 06/02/14  8:54 PM  Result Value Ref Range Status   MRSA, PCR NEGATIVE NEGATIVE Final   Staphylococcus aureus NEGATIVE NEGATIVE Final    Comment:        The Xpert SA Assay (FDA approved for NASAL specimens in patients over 20 years of age), is one component of a comprehensive surveillance program.  Test performance has been validated by [facility] for patients greater than or equal to 67 year old. It is not intended to diagnose infection nor to guide or monitor treatment.     Medical History: Past Medical History  Diagnosis Date  . Thyroid disease   . DVT (deep venous thrombosis)   . Acute massive pulmonary embolism 07/13/2012    Massive PE w/ PEA arrest  07/13/12 >TNK >IVC filter >discharged on comadin    . Cardiogenic shock 07/13/2012  . Hemorrhagic shock 07/15/2012  . Acute renal failure 07/14/2012  . Acute respiratory failure with hypoxia 07/14/2012  . Hyperlipemia 02/25/2013  . Hypertension 06/06/2013  . Diabetes mellitus with neurological manifestations, controlled 06/06/2013    Medications:  See EMR  Assessment: Hannah [name] with colonic stricture s/p partial colectomy and colostomy. Adding zosyn for wound coverage. Wound was noted to have green discharge. SCr <1, eCrCl 60-65 ml/min, afebrile, leukocytosis to 20.5.  Goal of Therapy:  Resolution of infection  Plan:  Zosyn 3.375 g IV q8h F/u renal fx, blood cultures, duration of therapy F/u CT abdomen   Agapito Games, PharmD, BCPS Clinical Pharmacist Pager: [phone] 06/07/2014 9:33 AM

## 2014-06-08 DIAGNOSIS — D649 Anemia, unspecified: Secondary | ICD-10-CM

## 2014-06-08 LAB — CBC
HCT: 29.8 % — ABNORMAL LOW (ref 36.0–46.0)
Hemoglobin: 9.8 g/dL — ABNORMAL LOW (ref 12.0–15.0)
MCH: 27.6 pg (ref 26.0–34.0)
MCHC: 32.9 g/dL (ref 30.0–36.0)
MCV: 83.9 fL (ref 78.0–100.0)
PLATELETS: 218 10*3/uL (ref 150–400)
RBC: 3.55 MIL/uL — ABNORMAL LOW (ref 3.87–5.11)
RDW: 14.6 % (ref 11.5–15.5)
WBC: 19.6 10*3/uL — ABNORMAL HIGH (ref 4.0–10.5)

## 2014-06-08 LAB — HEPARIN LEVEL (UNFRACTIONATED): Heparin Unfractionated: 0.32 IU/mL (ref 0.30–0.70)

## 2014-06-08 LAB — GLUCOSE, CAPILLARY
GLUCOSE-CAPILLARY: 108 mg/dL — AB (ref 65–99)
GLUCOSE-CAPILLARY: 113 mg/dL — AB (ref 65–99)
Glucose-Capillary: 108 mg/dL — ABNORMAL HIGH (ref 65–99)
Glucose-Capillary: 122 mg/dL — ABNORMAL HIGH (ref 65–99)
Glucose-Capillary: 92 mg/dL (ref 65–99)
Glucose-Capillary: 96 mg/dL (ref 65–99)

## 2014-06-08 LAB — BASIC METABOLIC PANEL
ANION GAP: 8 (ref 5–15)
BUN: 30 mg/dL — AB (ref 6–20)
CO2: 25 mmol/L (ref 22–32)
Calcium: 8.4 mg/dL — ABNORMAL LOW (ref 8.9–10.3)
Chloride: 99 mmol/L — ABNORMAL LOW (ref 101–111)
Creatinine, Ser: 0.86 mg/dL (ref 0.44–1.00)
GFR calc Af Amer: >60 mL/min (ref >60)
Glucose, Bld: 92 mg/dL (ref 65–99)
POTASSIUM: 4.2 mmol/L (ref 3.5–5.1)
SODIUM: 132 mmol/L — AB (ref 135–145)

## 2014-06-08 LAB — PROTIME-INR
INR: 1.18 (ref 0.00–1.49)
Prothrombin Time: 15.2 seconds (ref 11.6–15.2)

## 2014-06-08 MED ORDER — FAT EMULSION 20 % IV EMUL
240.0000 mL | INTRAVENOUS | Status: AC
  Administered 2014-06-08: 240 mL via INTRAVENOUS
  Filled 2014-06-08: qty 250

## 2014-06-08 MED ORDER — M.V.I. ADULT IV INJ
INJECTION | INTRAVENOUS | Status: AC
  Administered 2014-06-08: 18:00:00 via INTRAVENOUS
  Filled 2014-06-08: qty 1920

## 2014-06-08 NOTE — Progress Notes (Signed)
PARENTERAL NUTRITION CONSULT NOTE - FOLLOW UP  Pharmacy Consult:  TPN Indication:  Colonic stricture  Allergies  Allergen Reactions  . Augmentin [Amoxicillin-Pot Clavulanate] Other (See Comments)    Severe vaginal itching  . Tranxene [Clorazepate] Itching    Patient Measurements: Height: '5\' 7"'  (170.2 cm) Weight: 200 lb 1.6 oz (90.765 kg) IBW/kg (Calculated) : 61.6  Vital Signs: Temp: 98.9 F (37.2 C) (06/05 0518) Temp Source: Oral (06/05 0518) BP: 153/76 mmHg (06/05 0518) Pulse Rate: 118 (06/05 0518) Intake/Output from previous day: 06/04 0701 - 06/05 0700 In: 0  Out: 120 [Stool:120]  Labs:  Recent Labs  06/06/14 0617 06/07/14 0522 06/08/14 0542  WBC 18.3* 20.5* 19.6*  HGB 10.8* 10.3* 9.8*  HCT 32.8* 31.6* 29.8*  PLT 174 207 218  INR 1.17 1.20 1.18     Recent Labs  06/06/14 0617 06/07/14 0522 06/08/14 0542  NA 128* 132* 132*  K 4.3 4.1 4.2  CL 97* 97* 99*  CO2 '24 26 25  ' GLUCOSE 121* 128* 92  BUN 52* 37* 30*  CREATININE 0.99 0.85 0.86  CALCIUM 8.2* 8.4* 8.4*  MG 1.6* 1.8  --   PHOS 2.8 3.2  --    Estimated Creatinine Clearance: 67.4 mL/min (by C-G formula based on Cr of 0.86).    Recent Labs  06/07/14 2111 06/08/14 0006 06/08/14 0436  GLUCAP 102* 122* 96     Insulin Requirements in the past 24 hours: 3 units moderate SSI + 75 units regular insulin in TPN  Assessment: 74 YOF found to have a colonic stricture with obstruction on colonoscopy 05/30/14.  S/p sigmoid colectomy and colostomy on 06/03/14.  She continues on TPN for nutritional support.  Surgeries/Procedures:      *5/27: colonoscopy showed colonic stricture found    *5/31: partial colectomy and colostomy GI: Prealbumin 11.8 >> 13.  Colostomy 120 cc, vomited brown feculent output yesterday and NG removed 6/2 Endo: hypothyroid on IV Synthroid.  DM - CBGs well controlled Lytes: Na improved to 132, low CL 99, others WNL Renal: SCr decreased to 0.86, CrCL 67 Cards: HTN / HLD -IV  Lopressor Hepatobil: LFTs wnl, alk phos, tbili remain wnl, TG WNL ID: afebrile, WBC up to 20.5 - like acute phase post-surg Anticoag: hx DVT/PE s/p IVC filter, Coumadin PTA >> heparin gtt Best Practices: heparin gtt, MC TPN Access: PICC line placed 05/30/14 TPN start date: 05/30/14  Current Nutrition:  Clinimix Sips with meds  Nutritional Goals:  1850-2050 kCal, 85-95g protein per day   Plan:  - Continue Clinimix E 5/15 at goal rate of 80 ml/hr + IVFE at 10 ml/hr to provide 1843 kCal and 96g protein per day.  TPN meeting 100% of patient's needs - Daily multivitamin and trace elements - Thiamine 540GQ and folic acid 75m added to TPN - Continue with 75 units regular insulin in TPN + SSI - Mag sulfate 2gm IV x 1 on 6/4, f/u labs on Mon - F/U daily  MEudelia Bunch Pharm.D. [phone]/05/2014 7:29 AM

## 2014-06-08 NOTE — Progress Notes (Signed)
[facility] PROGRESS NOTE  Hannah [name] ZOX:[id number] DOB: 10/24/1940 DOA: 05/29/2014  PCP: Hannah [name]., DO  Brief HPI:  74 yo female w/ history DM, diverticulosis, DVT/PE on chronic Coumadin, HTN, and dyslipidemia. Presented w/ intractable N/V in setting of recent dx colonic stricture. Pt had presented to ER on multiple occasions for abdominal pain that has progressively worsened and has become assoc with N/V. Sx's began one week ago; CT abdomen 5/20 revealed stricture at descending and sigmoid colon. Plan was for pt to check w/ PCP re OK to hold Coumadin for colonoscopy then proceed as OP. On 5/21 INR was 5.49 so she was instructed to hold her Coumadin. Unfortunately her sx's worsened and she came to ER. On colonoscopy, she was found to have stricture so surgery was consulted.She underwent exploratory laparotomy with partial colectomy and colostomy on May 31. Subsequently, patient started developing low-grade fever and her WBC started increasing. CT abdomen was unremarkable for infection. She does have some evidence for infection over her surgical wound. All other infectious workup was negative. She was started on Zosyn.  Past medical history:  Past Medical History  Diagnosis Date  . Thyroid disease   . DVT (deep venous thrombosis)   . Acute massive pulmonary embolism 07/13/2012    Massive PE w/ PEA arrest 07/13/12 >TNK >IVC filter >discharged on comadin    . Cardiogenic shock 07/13/2012  . Hemorrhagic shock 07/15/2012  . Acute renal failure 07/14/2012  . Acute respiratory failure with hypoxia 07/14/2012  . Hyperlipemia 02/25/2013  . Hypertension 06/06/2013  . Diabetes mellitus with neurological manifestations, controlled 06/06/2013    Consultants: Gen. Surgery, gastroenterology  Procedures:  Flexible sigmoidoscopy May 27 She underwent exploratory laparotomy with partial colectomy and colostomy on May 31  Antibiotics: Zosyn initiated 6/4  Subjective: Patient feels well this  morning. Denies any abdominal pain.   Objective: Vital Signs  Filed Vitals:   06/07/14 0517 06/07/14 1335 06/08/14 0042 06/08/14 0518  BP: 146/75 153/82 148/58 153/76  Pulse: 110 25 114 118  Temp: 99.8 F (37.7 C) 99.2 F (37.3 C)  98.9 F (37.2 C)  TempSrc: Oral Oral  Oral  Resp: Height:      Weight:      SpO2: 96% 94%  98%    Intake/Output Summary (Last 24 hours) at 06/08/14 0829 Last data filed at 06/08/14 0810  Gross per 24 hour  Intake      0 ml  Output    120 ml  Net   -120 ml   Filed Weights   05/29/14 1733  Weight: 90.765 kg (200 lb 1.6 oz)    General appearance: alert, cooperative, but distracted, appears stated age, no distress and moderately obese Resp: clear to auscultation bilaterally Cardio: regular rate and rhythm, S1, S2 normal, no murmur, click, rub or gallop GI: Most of the abdomen covered with dressing. Per the surgery PA the wound has a yellowish green exudate. There is ostomy bag noted on the left. Bowel sounds heard, minimal. Extremities: extremities normal, atraumatic, no cyanosis or edema Neurologic: No focal deficits.  Lab Results:  Basic Metabolic Panel:  Recent Labs Lab 06/02/14 1035 06/05/14 0430 06/06/14 0617 06/07/14 0522 06/08/14 0542  NA 135 128* 128* 132* 132*  K 3.5 4.9 4.3 4.1 4.2  CL 99* 96* 97* 97* 99*  CO2 GLUCOSE 172* 158* 121* 128* 92  BUN 16 55* 52* 37* 30*  CREATININE 0.70 1.33* 0.99  0.85 0.86  CALCIUM 8.6* 8.7* 8.2* 8.4* 8.4*  MG 1.6* 1.7 1.6* 1.8  --   PHOS 3.1 5.0* 2.8 3.2  --    Liver Function Tests:  Recent Labs Lab 06/02/14 1035 06/05/14 0430  AST 14* 27  ALT 12* 29  ALKPHOS 44 59  BILITOT 0.6 0.9  PROT 5.8* 5.5*  ALBUMIN 2.8* 2.2*   CBC:  Recent Labs Lab 06/02/14 0530 06/03/14 0620 06/05/14 0430 06/06/14 0617 06/07/14 0522 06/08/14 0542  WBC 9.3 11.5* 17.3* 18.3* 20.5* 19.6*  NEUTROABS 5.1  --   --   --   --   --   HGB 13.9 13.4 12.3 10.8* 10.3* 9.8*  HCT  42.7 41.3 37.6 32.8* 31.6* 29.8*  MCV 85.9 86.4 86.0 85.0 84.3 83.9  PLT 292 274 178 174 207 218   CBG:  Recent Labs Lab 06/07/14 1638 06/07/14 2111 06/08/14 0006 06/08/14 0436 06/08/14 0748  GLUCAP 110* 102* 122* 96 108*    Recent Results (from the past 240 hour(s))  Surgical pcr screen     Status: None   Collection Time: 06/02/14  8:54 PM  Result Value Ref Range Status   MRSA, PCR NEGATIVE NEGATIVE Final   Staphylococcus aureus NEGATIVE NEGATIVE Final    Comment:        The Xpert SA Assay (FDA approved for NASAL specimens in patients over 1 years of age), is one component of a comprehensive surveillance program.  Test performance has been validated by [facility] for patients greater than or equal to 56 year old. It is not intended to diagnose infection nor to guide or monitor treatment.   Clostridium Difficile by PCR     Status: None   Collection Time: 06/07/14  1:21 PM  Result Value Ref Range Status   C difficile by pcr NEGATIVE NEGATIVE Final      Studies/Results: Ct Abdomen Pelvis W Contrast  06/07/2014   CLINICAL DATA:  Generalized abdominal pain. Worsening leukocytosis. Postop from partial colectomy with colostomy.  EXAM: CT ABDOMEN AND PELVIS WITH CONTRAST  TECHNIQUE: Multidetector CT imaging of the abdomen and pelvis was performed using the standard protocol following bolus administration of intravenous contrast.  CONTRAST:  OMNIPAQUE IOHEXOL 300 MG/ML  SOLN  COMPARISON:  05/23/2014  FINDINGS: Lower Chest: Increased bilateral lower lobe subsegmental atelectasis since prior exam.  Hepatobiliary: No masses or other significant abnormality identified. Gallbladder is unremarkable.  Pancreas: No mass, inflammatory changes, or other significant abnormality identified.  Spleen:  Within normal limits in size and appearance.  Adrenals:  No masses identified.  Kidneys/Urinary Tract:  No evidence of masses or hydronephrosis.  Stomach/Bowel/Peritoneum: Postop changes  are seen from sigmoid colon resection and left lower quadrant colostomy. Decreased colonic dilatation is seen since previous study. Mild dilatation of small bowel loops is seen without evidence of focal transition point, likely due to postop ileus. No evidence of abscess or free fluid.  Vascular/Lymphatic: No pathologically enlarged lymph nodes identified. IVC filter remains in place. No other significant abnormality visualized.  Reproductive: Prior hysterectomy noted. Adnexal regions are unremarkable in appearance.  Other: Left hip prosthesis results in beam hardening artifact through the inferior aspect of the pelvis.  Musculoskeletal:  No suspicious bone lesions identified.  IMPRESSION: Expected postop changes from sigmoid colon resection with left lower quadrant colostomy. Decreased colonic dilatation, with mild postop ileus pattern.  No evidence of abscess or free fluid.  Increased bibasilar atelectasis.   Electronically Signed   By: Alver Sorrow.D.  On: 06/07/2014 20:36   Dg Chest Port 1 View  06/06/2014   CLINICAL DATA:  Fever, history of DVT, pulmonary embolism, hypertension, diabetes  EXAM: PORTABLE CHEST - 1 VIEW  COMPARISON:  Portable exam 0855 hours compared 08/29/2012  FINDINGS: RIGHT arm PICC line tip projects over cavoatrial junction.  Enlargement of cardiac silhouette with pulmonary vascular congestion.  Atherosclerotic calcification aorta.  Chronic elevation RIGHT diaphragm.  Decreased lung volumes with RIGHT basilar atelectasis.  No definite infiltrate, pleural effusion or pneumothorax.  IMPRESSION: Decreased lung volumes with RIGHT basilar atelectasis.  Enlargement of cardiac silhouette with pulmonary vascular congestion.   Electronically Signed   By: Ulyses Southward M.D.   On: 06/06/2014 09:07    Medications:  Scheduled: . antiseptic oral rinse  7 mL Mouth Rinse q12n4p  . chlorhexidine  15 mL Mouth Rinse BID  . insulin aspart  0-20 Units Subcutaneous 6 times per day  . levothyroxine  12.5  mcg Intravenous Daily  . metoprolol  5 mg Intravenous 4 times per day  . piperacillin-tazobactam (ZOSYN)  IV  3.375 g Intravenous 3 times per day  . sodium hypochlorite   Irrigation Daily   Continuous: . Marland KitchenTPN (CLINIMIX-E) Adult 80 mL/hr at 06/07/14 1722   And  . fat emulsion 240 mL (06/07/14 1721)  . Marland KitchenTPN (CLINIMIX-E) Adult     And  . fat emulsion    . heparin 1,400 Units/hr (06/07/14 1530)  . lactated ringers 10 mL/hr at 06/03/14 1015   KGM:WNUUVOZDGUY, HYDROmorphone (DILAUDID) injection, HYDROmorphone (DILAUDID) injection, ondansetron (ZOFRAN) IV, promethazine, sodium chloride  Assessment/Plan:  Principal Problem:   Colonic stricture Active Problems:   Long term current use of anticoagulant therapy   Hypothyroid   Hypertension   Diabetes mellitus with neurological manifestations, controlled   History of pulmonary embolism   Dehydration   Acute renal failure    Sigmoid colon stricture Now status post partial colectomy and colostomy. General surgery is following. Initially seen by GI and underwent flexible sigmoidoscopy which showed sigmoid colon stricture. Subsequently Surgery was consulted and the patient underwent surgery. General surgery is following and managing. CT scan done yesterday does suggest postoperative ileus. Patient will remain nothing by mouth for now.  Dehydration/Acute renal failure/hyponatremia Sodium level is improved with infusion of normal saline. Creatinine has improved. Hold off on further IV fluids. Continue just TPN.   Low Grade Fever/Leukocytosis/Possible wound infection Patient was started on Zosyn on 6/4 for presumed surgical wound infection. WBC has stabilized. Low-grade fevers appear to have resolved. UA is unremarkable for infection. Chest x-ray shows only atelectasis. CT scan did not show any intra-abdominal infection. Blood cultures are pending. Lactic acid is normal. Stool for C. difficile was negative. Continue to monitor for now.  Sinus  tachycardia Has been persistent for the last many days. She does not appear to be symptomatic. She is on beta blockers around the clock. Continue to monitor.  History of pulmonary embolism on anticoagulation with warfarin She was diagnosed with a submassive pulmonary embolism in July 2014. She actually had a PEA arrest and required thrombolytics. Considering this patient was placed on heparin bridge. She is currently off warfarin for the surgery. When okay with surgery and when she is able to take orally, warfarin to be resumed.  Hypothyroidism She is on Intravenous levothyroxin.  History of essential Hypertension PRN hydralazine and intravenous beta blocker  Diabetes mellitus with neurological manifestations, controlled SSI q 4 while NPO  Hypokalemia Repleted. Continue to monitor.  Normocytic anemia Some drop  noted in Hemoglobin. No overt bleeding. Continue to monitor.  DVT Prophylaxis: On IV heparin    Code Status: Full code  Family Communication: No family at bedside today. Discussed with her daughter, Ms. Susann Givens 6/4. Disposition Plan:  PT and OT is following. CIR was recommended. Rehabilitation physician has been consulted.     LOS: 10 days   [place]  [facility] Pager [phone] 06/08/2014, 8:29 AM  If 7PM-7AM, please contact night-coverage at [email or web address], password [facility]

## 2014-06-08 NOTE — Progress Notes (Signed)
Patient had a run of SVT non sustained; now back to 110s.  Denies CP.  MD notified. No new orders. Will continue to monitor patient.

## 2014-06-08 NOTE — Clinical Social Work Placement (Signed)
   CLINICAL SOCIAL WORK PLACEMENT  NOTE  Date:  06/08/2014  Patient Details  Name: Hannah [name] MRN: [id number] Date of Birth: November 11, 1940  Clinical Social Work is seeking post-discharge placement for this patient at the Skilled  Nursing Facility level of care (*CSW will initial, date and re-position this form in  chart as items are completed):  Yes   Patient/family provided with [facility] Clinical Social Work Department's list of facilities offering this level of care within the geographic area requested by the patient (or if unable, by the patient's family).  Yes   Patient/family informed of their freedom to choose among providers that offer the needed level of care, that participate in Medicare, Medicaid or managed care program needed by the patient, have an available bed and are willing to accept the patient.  Yes   Patient/family informed of [facility]'s ownership interest in [facility] and [facility], as well as of the fact that they are under no obligation to receive care at these facilities.  PASRR submitted to EDS on       PASRR number received on       Existing PASRR number confirmed on 06/08/14     FL2 transmitted to all facilities in geographic area requested by pt/family on 06/08/14     FL2 transmitted to all facilities within larger geographic area on 06/08/14     Patient informed that his/her managed care company has contracts with or will negotiate with certain facilities, including the following:            Patient/family informed of bed offers received.  Patient chooses bed at       Physician recommends and patient chooses bed at      Patient to be transferred to   on  .  Patient to be transferred to facility by       Patient family notified on   of transfer.  Name of family member notified:        PHYSICIAN       Additional Comment:    _______________________________________________ Beverly Sessions, LCSW 06/08/2014, 3:44 PM

## 2014-06-08 NOTE — Progress Notes (Signed)
ANTICOAGULATION CONSULT NOTE - Follow Up Consult  Pharmacy Consult for heparin Indication: DVT  Allergies  Allergen Reactions  . Augmentin [Amoxicillin-Pot Clavulanate] Other (See Comments)    Severe vaginal itching  . Tranxene [Clorazepate] Itching    Patient Measurements: Height: 5\' 7"  (170.2 cm) Weight: 200 lb 1.6 oz (90.765 kg) IBW/kg (Calculated) : 61.6 Heparin Dosing Weight:   Vital Signs: Temp: 98.9 F (37.2 C) (06/05 0518) Temp Source: Oral (06/05 0518) BP: 153/76 mmHg (06/05 0518) Pulse Rate: 118 (06/05 0518)  Labs:  Recent Labs  06/06/14 0617 06/06/14 1220 06/07/14 0522 06/08/14 0542  HGB 10.8*  --  10.3* 9.8*  HCT 32.8*  --  31.6* 29.8*  PLT 174  --  207 218  LABPROT 15.0  --  15.3* 15.2  INR 1.17  --  1.20 1.18  HEPARINUNFRC 0.48 0.57 0.55 0.32  CREATININE 0.99  --  0.85 0.86    Estimated Creatinine Clearance: 67.4 mL/min (by C-G formula based on Cr of 0.86).   Medications:  Scheduled:  . antiseptic oral rinse  7 mL Mouth Rinse q12n4p  . chlorhexidine  15 mL Mouth Rinse BID  . insulin aspart  0-20 Units Subcutaneous 6 times per day  . levothyroxine  12.5 mcg Intravenous Daily  . metoprolol  5 mg Intravenous 4 times per day  . piperacillin-tazobactam (ZOSYN)  IV  3.375 g Intravenous 3 times per day  . sodium hypochlorite   Irrigation Daily   Infusions:  . Marland KitchenTPN (CLINIMIX-E) Adult 80 mL/hr at 06/07/14 1722   And  . fat emulsion 240 mL (06/07/14 1721)  . Marland KitchenTPN (CLINIMIX-E) Adult     And  . fat emulsion    . heparin 1,400 Units/hr (06/08/14 1007)  . lactated ringers 10 mL/hr at 06/03/14 1015    Assessment: 74 yo female with hx of DVT & PE is currently on therapeutic heparin.  Heparin level is 0.32.  Hgb down a little to 9.8 and Plt is stable at 218 K  Goal of Therapy:  Heparin level 0.3-0.7 units/ml Monitor platelets by anticoagulation protocol: Yes   Plan:  Continue Heparin 1250 units/hr Daily HL/CBC Monitor s/sx of bleeding F/u  resume of coumadin (when ok w/ surgery and taking PO well; not yet)  Alira Fretwell, Tsz-Yin 06/08/2014,10:27 AM

## 2014-06-08 NOTE — Progress Notes (Signed)
Patient ID: Hannah [name], female   DOB: June 19, 1940, 74 y.o.   MRN: [id number]     [facility]      [address] [place], [place] [address]    Phone: [phone] FAX: [phone]     Subjective: Vomiting yesterday.  WBC up.  Afebrile, but tachycardic.  Objective:  Vital signs:  Filed Vitals:   06/07/14 0517 06/07/14 1335 06/08/14 0042 06/08/14 0518  BP: 146/75 153/82 148/58 153/76  Pulse: 110 25 114 118  Temp: 99.8 F (37.7 C) 99.2 F (37.3 C)  98.9 F (37.2 C)  TempSrc: Oral Oral  Oral  Resp: '20 18  18  ' Height:      Weight:      SpO2: 96% 94%  98%    Last BM Date: 06/08/14  Intake/Output   Yesterday:  06/04 0701 - 06/05 0700 In: 0  Out: 120 [Stool:120] This shift:  Total I/O In: 0  Out: 430 [Urine:300; Stool:130]  Physical Exam: General: Pt awake/alert/oriented x4 in no acute distress Abdomen: Soft.  Nondistended.  +bs.  Midline wound, dressing is dry.  Colostomy with stool.  Mildly tender at incisions only.  No evidence of peritonitis.  No incarcerated hernias.    Problem List:   Principal Problem:   Colonic stricture Active Problems:   Long term current use of anticoagulant therapy   Hypothyroid   Hypertension   Diabetes mellitus with neurological manifestations, controlled   History of pulmonary embolism   Dehydration   Acute renal failure    Results:   Labs: Results for orders placed or performed during the hospital encounter of 05/29/14 (from the past 48 hour(s))  Urinalysis, Routine w reflex microscopic (not at [facility])     Status: Abnormal   Collection Time: 06/06/14  9:45 AM  Result Value Ref Range   Color, Urine YELLOW YELLOW   APPearance CLOUDY (A) CLEAR   Specific Gravity, Urine 1.014 1.005 - 1.030   pH 5.0 5.0 - 8.0   Glucose, UA NEGATIVE NEGATIVE mg/dL   Hgb urine dipstick NEGATIVE NEGATIVE   Bilirubin Urine NEGATIVE NEGATIVE   Ketones, ur NEGATIVE NEGATIVE mg/dL   Protein, ur  NEGATIVE NEGATIVE mg/dL   Urobilinogen, UA 1.0 0.0 - 1.0 mg/dL   Nitrite NEGATIVE NEGATIVE   Leukocytes, UA SMALL (A) NEGATIVE  Urine microscopic-add on     Status: Abnormal   Collection Time: 06/06/14  9:45 AM  Result Value Ref Range   Squamous Epithelial / LPF MANY (A) RARE   WBC, UA 0-2 <3 WBC/hpf   Bacteria, UA RARE RARE  Glucose, capillary     Status: Abnormal   Collection Time: 06/06/14 11:57 AM  Result Value Ref Range   Glucose-Capillary 149 (H) 65 - 99 mg/dL  Heparin level (unfractionated)     Status: None   Collection Time: 06/06/14 12:20 PM  Result Value Ref Range   Heparin Unfractionated 0.57 0.30 - 0.70 IU/mL    Comment:        IF HEPARIN RESULTS ARE BELOW EXPECTED VALUES, AND PATIENT DOSAGE HAS BEEN CONFIRMED, SUGGEST FOLLOW UP TESTING OF ANTITHROMBIN III LEVELS.   Glucose, capillary     Status: Abnormal   Collection Time: 06/06/14  5:27 PM  Result Value Ref Range   Glucose-Capillary 113 (H) 65 - 99 mg/dL  Glucose, capillary     Status: Abnormal   Collection Time: 06/06/14  8:20 PM  Result Value Ref Range   Glucose-Capillary 151 (H) 65 -  99 mg/dL  Glucose, capillary     Status: Abnormal   Collection Time: 06/07/14 12:27 AM  Result Value Ref Range   Glucose-Capillary 155 (H) 65 - 99 mg/dL  Glucose, capillary     Status: Abnormal   Collection Time: 06/07/14  5:10 AM  Result Value Ref Range   Glucose-Capillary 126 (H) 65 - 99 mg/dL  CBC     Status: Abnormal   Collection Time: 06/07/14  5:22 AM  Result Value Ref Range   WBC 20.5 (H) 4.0 - 10.5 K/uL   RBC 3.75 (L) 3.87 - 5.11 MIL/uL   Hemoglobin 10.3 (L) 12.0 - 15.0 g/dL   HCT 31.6 (L) 36.0 - 46.0 %   MCV 84.3 78.0 - 100.0 fL   MCH 27.5 26.0 - 34.0 pg   MCHC 32.6 30.0 - 36.0 g/dL   RDW 14.8 11.5 - 15.5 %   Platelets 207 150 - 400 K/uL  Heparin level (unfractionated)     Status: None   Collection Time: 06/07/14  5:22 AM  Result Value Ref Range   Heparin Unfractionated 0.55 0.30 - 0.70 IU/mL    Comment:         IF HEPARIN RESULTS ARE BELOW EXPECTED VALUES, AND PATIENT DOSAGE HAS BEEN CONFIRMED, SUGGEST FOLLOW UP TESTING OF ANTITHROMBIN III LEVELS.   Basic metabolic panel     Status: Abnormal   Collection Time: 06/07/14  5:22 AM  Result Value Ref Range   Sodium 132 (L) 135 - 145 mmol/L   Potassium 4.1 3.5 - 5.1 mmol/L   Chloride 97 (L) 101 - 111 mmol/L   CO2 26 22 - 32 mmol/L   Glucose, Bld 128 (H) 65 - 99 mg/dL   BUN 37 (H) 6 - 20 mg/dL   Creatinine, Ser 0.85 0.44 - 1.00 mg/dL   Calcium 8.4 (L) 8.9 - 10.3 mg/dL   GFR calc non Af Amer >60 >60 mL/min   GFR calc Af Amer >60 >60 mL/min    Comment: (NOTE) The eGFR has been calculated using the CKD EPI equation. This calculation has not been validated in all clinical situations. eGFR's persistently <60 mL/min signify possible Chronic Kidney Disease.    Anion gap 9 5 - 15  Magnesium     Status: None   Collection Time: 06/07/14  5:22 AM  Result Value Ref Range   Magnesium 1.8 1.7 - 2.4 mg/dL  Phosphorus     Status: None   Collection Time: 06/07/14  5:22 AM  Result Value Ref Range   Phosphorus 3.2 2.5 - 4.6 mg/dL  Protime-INR     Status: Abnormal   Collection Time: 06/07/14  5:22 AM  Result Value Ref Range   Prothrombin Time 15.3 (H) 11.6 - 15.2 seconds   INR 1.20 0.00 - 1.49  Glucose, capillary     Status: Abnormal   Collection Time: 06/07/14  7:49 AM  Result Value Ref Range   Glucose-Capillary 139 (H) 65 - 99 mg/dL  Culture, blood (routine x 2)     Status: None (Preliminary result)   Collection Time: 06/07/14  9:50 AM  Result Value Ref Range   Specimen Description BLOOD  LEFT WRIST    Special Requests BOTTLES DRAWN AEROBIC ONLY  8CC    Culture             BLOOD CULTURE RECEIVED NO GROWTH TO DATE CULTURE WILL BE HELD FOR 5 DAYS BEFORE ISSUING A FINAL NEGATIVE REPORT Performed at [facility]    Report  Status PENDING   Lactic acid, plasma     Status: None   Collection Time: 06/07/14  9:50 AM  Result Value Ref  Range   Lactic Acid, Venous 1.3 0.5 - 2.0 mmol/L  Culture, blood (routine x 2)     Status: None (Preliminary result)   Collection Time: 06/07/14  9:55 AM  Result Value Ref Range   Specimen Description BLOOD LEFT HAND    Special Requests BOTTLES DRAWN AEROBIC ONLY 10CC    Culture             BLOOD CULTURE RECEIVED NO GROWTH TO DATE CULTURE WILL BE HELD FOR 5 DAYS BEFORE ISSUING A FINAL NEGATIVE REPORT Performed at [facility]    Report Status PENDING   Glucose, capillary     Status: None   Collection Time: 06/07/14 11:44 AM  Result Value Ref Range   Glucose-Capillary 98 65 - 99 mg/dL  Clostridium Difficile by PCR     Status: None   Collection Time: 06/07/14  1:21 PM  Result Value Ref Range   C difficile by pcr NEGATIVE NEGATIVE  Glucose, capillary     Status: Abnormal   Collection Time: 06/07/14  4:38 PM  Result Value Ref Range   Glucose-Capillary 110 (H) 65 - 99 mg/dL  Glucose, capillary     Status: Abnormal   Collection Time: 06/07/14  9:11 PM  Result Value Ref Range   Glucose-Capillary 102 (H) 65 - 99 mg/dL  Glucose, capillary     Status: Abnormal   Collection Time: 06/08/14 12:06 AM  Result Value Ref Range   Glucose-Capillary 122 (H) 65 - 99 mg/dL   Comment 1 Notify RN    Comment 2 Document in Chart   Glucose, capillary     Status: None   Collection Time: 06/08/14  4:36 AM  Result Value Ref Range   Glucose-Capillary 96 65 - 99 mg/dL   Comment 1 Notify RN    Comment 2 Document in Chart   Protime-INR     Status: None   Collection Time: 06/08/14  5:42 AM  Result Value Ref Range   Prothrombin Time 15.2 11.6 - 15.2 seconds   INR 1.18 0.00 - 1.49  CBC     Status: Abnormal   Collection Time: 06/08/14  5:42 AM  Result Value Ref Range   WBC 19.6 (H) 4.0 - 10.5 K/uL   RBC 3.55 (L) 3.87 - 5.11 MIL/uL   Hemoglobin 9.8 (L) 12.0 - 15.0 g/dL   HCT 29.8 (L) 36.0 - 46.0 %   MCV 83.9 78.0 - 100.0 fL   MCH 27.6 26.0 - 34.0 pg   MCHC 32.9 30.0 - 36.0 g/dL   RDW 14.6  11.5 - 15.5 %   Platelets 218 150 - 400 K/uL  Heparin level (unfractionated)     Status: None   Collection Time: 06/08/14  5:42 AM  Result Value Ref Range   Heparin Unfractionated 0.32 0.30 - 0.70 IU/mL    Comment:        IF HEPARIN RESULTS ARE BELOW EXPECTED VALUES, AND PATIENT DOSAGE HAS BEEN CONFIRMED, SUGGEST FOLLOW UP TESTING OF ANTITHROMBIN III LEVELS.   Basic metabolic panel     Status: Abnormal   Collection Time: 06/08/14  5:42 AM  Result Value Ref Range   Sodium 132 (L) 135 - 145 mmol/L   Potassium 4.2 3.5 - 5.1 mmol/L   Chloride 99 (L) 101 - 111 mmol/L   CO2 25 22 - 32 mmol/L  Glucose, Bld 92 65 - 99 mg/dL   BUN 30 (H) 6 - 20 mg/dL   Creatinine, Ser 0.86 0.44 - 1.00 mg/dL   Calcium 8.4 (L) 8.9 - 10.3 mg/dL   GFR calc non Af Amer >60 >60 mL/min   GFR calc Af Amer >60 >60 mL/min    Comment: (NOTE) The eGFR has been calculated using the CKD EPI equation. This calculation has not been validated in all clinical situations. eGFR's persistently <60 mL/min signify possible Chronic Kidney Disease.    Anion gap 8 5 - 15  Glucose, capillary     Status: Abnormal   Collection Time: 06/08/14  7:48 AM  Result Value Ref Range   Glucose-Capillary 108 (H) 65 - 99 mg/dL    Imaging / Studies: Ct Abdomen Pelvis W Contrast  06/07/2014   CLINICAL DATA:  Generalized abdominal pain. Worsening leukocytosis. Postop from partial colectomy with colostomy.  EXAM: CT ABDOMEN AND PELVIS WITH CONTRAST  TECHNIQUE: Multidetector CT imaging of the abdomen and pelvis was performed using the standard protocol following bolus administration of intravenous contrast.  CONTRAST:  136m OMNIPAQUE IOHEXOL 300 MG/ML  SOLN  COMPARISON:  05/23/2014  FINDINGS: Lower Chest: Increased bilateral lower lobe subsegmental atelectasis since prior exam.  Hepatobiliary: No masses or other significant abnormality identified. Gallbladder is unremarkable.  Pancreas: No mass, inflammatory changes, or other significant  abnormality identified.  Spleen:  Within normal limits in size and appearance.  Adrenals:  No masses identified.  Kidneys/Urinary Tract:  No evidence of masses or hydronephrosis.  Stomach/Bowel/Peritoneum: Postop changes are seen from sigmoid colon resection and left lower quadrant colostomy. Decreased colonic dilatation is seen since previous study. Mild dilatation of small bowel loops is seen without evidence of focal transition point, likely due to postop ileus. No evidence of abscess or free fluid.  Vascular/Lymphatic: No pathologically enlarged lymph nodes identified. IVC filter remains in place. No other significant abnormality visualized.  Reproductive: Prior hysterectomy noted. Adnexal regions are unremarkable in appearance.  Other: Left hip prosthesis results in beam hardening artifact through the inferior aspect of the pelvis.  Musculoskeletal:  No suspicious bone lesions identified.  IMPRESSION: Expected postop changes from sigmoid colon resection with left lower quadrant colostomy. Decreased colonic dilatation, with mild postop ileus pattern.  No evidence of abscess or free fluid.  Increased bibasilar atelectasis.   Electronically Signed   By: JEarle GellM.D.   On: 06/07/2014 20:36    Medications / Allergies:  Scheduled Meds: . antiseptic oral rinse  7 mL Mouth Rinse q12n4p  . chlorhexidine  15 mL Mouth Rinse BID  . insulin aspart  0-20 Units Subcutaneous 6 times per day  . levothyroxine  12.5 mcg Intravenous Daily  . metoprolol  5 mg Intravenous 4 times per day  . piperacillin-tazobactam (ZOSYN)  IV  3.375 g Intravenous 3 times per day  . sodium hypochlorite   Irrigation Daily   Continuous Infusions: . .Marland KitchenPN (CLINIMIX-E) Adult 80 mL/hr at 06/07/14 1722   And  . fat emulsion 240 mL (06/07/14 1721)  . .Marland KitchenPN (CLINIMIX-E) Adult     And  . fat emulsion    . heparin 1,400 Units/hr (06/07/14 1530)  . lactated ringers 10 mL/hr at 06/03/14 1015   PRN Meds:.hydrALAZINE, HYDROmorphone  (DILAUDID) injection, HYDROmorphone (DILAUDID) injection, ondansetron (ZOFRAN) IV, promethazine, sodium chloride  Antibiotics: Anti-infectives    Start     Dose/Rate Route Frequency Ordered Stop   06/07/14 1000  piperacillin-tazobactam (ZOSYN) IVPB 3.375 g  3.375 g 12.5 mL/hr over 240 Minutes Intravenous 3 times per day 06/07/14 0930     06/03/14 1615  gentamicin (GARAMYCIN) IVPB 100 mg  Status:  Discontinued    Comments:  Please make hang time appropriate with when pre-op abx was given   100 mg 200 mL/hr over 30 Minutes Intravenous  Once 06/03/14 1611 06/03/14 1616   06/03/14 1615  clindamycin (CLEOCIN) IVPB 900 mg  Status:  Discontinued     900 mg 100 mL/hr over 30 Minutes Intravenous  Once 06/03/14 1611 06/03/14 1617   06/03/14 0900  gentamicin (GARAMYCIN) IVPB 100 mg     100 mg 200 mL/hr over 30 Minutes Intravenous To [facility] 06/02/14 1111 06/03/14 1130   06/03/14 0900  clindamycin (CLEOCIN) IVPB 900 mg     900 mg 100 mL/hr over 30 Minutes Intravenous To [facility] 06/02/14 1208 06/03/14 1130   06/02/14 1115  clindamycin (CLEOCIN) IVPB 900 mg  Status:  Discontinued     900 mg 100 mL/hr over 30 Minutes Intravenous On call to O.R. 06/02/14 1111 06/02/14 1206       Assessment/Plan POD 5, s/p ex lap with partial colectomy and colostomy --> Dr. Brantley Stage -PT for mobilization -mobilize and pulm toilet(422m on IS only) -dakin's started saturday -WOC following patient for new colostomy Leukocytosis -CT of abdomen normal.  Unsure of etiology of leukocytosis.  High risk for PNA.  Per primary team. DVT prophylaxis -heparin drip (h/o PE)/SCDs FEN-TNA until ileus resolves, NPO, may need NGT if symptoms persist   EErby Pian ANP-BC [facility]   06/08/2014 9:29 AM

## 2014-06-09 LAB — GLUCOSE, CAPILLARY
GLUCOSE-CAPILLARY: 86 mg/dL (ref 65–99)
GLUCOSE-CAPILLARY: 107 mg/dL — AB (ref 65–99)
GLUCOSE-CAPILLARY: 131 mg/dL — AB (ref 65–99)
Glucose-Capillary: 102 mg/dL — ABNORMAL HIGH (ref 65–99)
Glucose-Capillary: 102 mg/dL — ABNORMAL HIGH (ref 65–99)
Glucose-Capillary: 113 mg/dL — ABNORMAL HIGH (ref 65–99)

## 2014-06-09 LAB — HEPARIN LEVEL (UNFRACTIONATED)
HEPARIN UNFRACTIONATED: 0.59 [IU]/mL (ref 0.30–0.70)
Heparin Unfractionated: 0.27 IU/mL — ABNORMAL LOW (ref 0.30–0.70)

## 2014-06-09 LAB — COMPREHENSIVE METABOLIC PANEL
ALT: 22 U/L (ref 14–54)
ANION GAP: 8 (ref 5–15)
AST: 22 U/L (ref 15–41)
Albumin: 1.8 g/dL — ABNORMAL LOW (ref 3.5–5.0)
Alkaline Phosphatase: 70 U/L (ref 38–126)
BUN: 23 mg/dL — ABNORMAL HIGH (ref 6–20)
CO2: 26 mmol/L (ref 22–32)
CREATININE: 0.86 mg/dL (ref 0.44–1.00)
Calcium: 8.3 mg/dL — ABNORMAL LOW (ref 8.9–10.3)
Chloride: 100 mmol/L — ABNORMAL LOW (ref 101–111)
GFR calc non Af Amer: >60 mL/min (ref >60)
Glucose, Bld: 73 mg/dL (ref 65–99)
Potassium: 4.1 mmol/L (ref 3.5–5.1)
Sodium: 134 mmol/L — ABNORMAL LOW (ref 135–145)
Total Bilirubin: 0.5 mg/dL (ref 0.3–1.2)
Total Protein: 5.4 g/dL — ABNORMAL LOW (ref 6.5–8.1)

## 2014-06-09 LAB — PHOSPHORUS: Phosphorus: 3.9 mg/dL (ref 2.5–4.6)

## 2014-06-09 LAB — DIFFERENTIAL
BASOS ABS: 0.2 10*3/uL — AB (ref 0.0–0.1)
Basophils Relative: 1 % (ref 0–1)
Eosinophils Absolute: 0.2 10*3/uL (ref 0.0–0.7)
Eosinophils Relative: 1 % (ref 0–5)
LYMPHS ABS: 2.5 10*3/uL (ref 0.7–4.0)
LYMPHS PCT: 15 % (ref 12–46)
MONO ABS: 1.3 10*3/uL — AB (ref 0.1–1.0)
MONOS PCT: 8 % (ref 3–12)
NEUTROS PCT: 75 % (ref 43–77)
Neutro Abs: 12.4 10*3/uL — ABNORMAL HIGH (ref 1.7–7.7)

## 2014-06-09 LAB — CBC
HCT: 28.6 % — ABNORMAL LOW (ref 36.0–46.0)
Hemoglobin: 9.5 g/dL — ABNORMAL LOW (ref 12.0–15.0)
MCH: 28 pg (ref 26.0–34.0)
MCHC: 33.2 g/dL (ref 30.0–36.0)
MCV: 84.4 fL (ref 78.0–100.0)
Platelets: 223 10*3/uL (ref 150–400)
RBC: 3.39 MIL/uL — AB (ref 3.87–5.11)
RDW: 14.7 % (ref 11.5–15.5)
WBC: 16.6 10*3/uL — ABNORMAL HIGH (ref 4.0–10.5)

## 2014-06-09 LAB — PROTIME-INR
INR: 1.22 (ref 0.00–1.49)
Prothrombin Time: 15.6 seconds — ABNORMAL HIGH (ref 11.6–15.2)

## 2014-06-09 LAB — MAGNESIUM: Magnesium: 1.6 mg/dL — ABNORMAL LOW (ref 1.7–2.4)

## 2014-06-09 LAB — TRIGLYCERIDES: TRIGLYCERIDES: 61 mg/dL (ref <150)

## 2014-06-09 LAB — PREALBUMIN: Prealbumin: 7.2 mg/dL — ABNORMAL LOW (ref 18–38)

## 2014-06-09 MED ORDER — FAT EMULSION 20 % IV EMUL
240.0000 mL | INTRAVENOUS | Status: AC
  Administered 2014-06-09: 240 mL via INTRAVENOUS
  Filled 2014-06-09: qty 250

## 2014-06-09 MED ORDER — TRACE MINERALS CR-CU-F-FE-I-MN-MO-SE-ZN IV SOLN
INTRAVENOUS | Status: AC
  Administered 2014-06-09: 17:00:00 via INTRAVENOUS
  Filled 2014-06-09: qty 1992

## 2014-06-09 MED ORDER — MAGNESIUM SULFATE 2 GM/50ML IV SOLN
2.0000 g | Freq: Once | INTRAVENOUS | Status: AC
  Administered 2014-06-09: 2 g via INTRAVENOUS
  Filled 2014-06-09: qty 50

## 2014-06-09 MED ORDER — ACETAMINOPHEN 325 MG PO TABS
650.0000 mg | ORAL_TABLET | Freq: Four times a day (QID) | ORAL | Status: DC | PRN
  Filled 2014-06-09: qty 2

## 2014-06-09 NOTE — Progress Notes (Signed)
6 Days Post-Op  Subjective: No further N/V  Objective: Vital signs in last 24 hours: Temp:  [98.4 F (36.9 C)-99.7 F (37.6 C)] 98.4 F (36.9 C) (06/06 0440) Pulse Rate:  [95-105] 101 (06/06 0440) Resp:  [18] 18 (06/06 0440) BP: (127-145)/(55-76) 127/55 mmHg (06/06 0440) SpO2:  [92 %-99 %] 97 % (06/06 0440) Last BM Date: 06/08/14  Intake/Output from previous day: 06/05 0701 - 06/06 0700 In: 0  Out: 570 [Urine:300; Stool:270] Intake/Output this shift: Total I/O In: -  Out: 410 [Urine:200; Stool:210]  General appearance: alert and cooperative GI: soft, ostomy pink with good output, wound dressing just changed  Lab Results:   Recent Labs  06/08/14 0542 06/09/14 0556  WBC 19.6* 16.6*  HGB 9.8* 9.5*  HCT 29.8* 28.6*  PLT 218 223   BMET  Recent Labs  06/08/14 0542 06/09/14 0556  NA 132* 134*  K 4.2 4.1  CL 99* 100*  CO2 25 26  GLUCOSE 92 73  BUN 30* 23*  CREATININE 0.86 0.86  CALCIUM 8.4* 8.3*   PT/INR  Recent Labs  06/08/14 0542 06/09/14 0557  LABPROT 15.2 15.6*  INR 1.18 1.22   ABG No results for input(s): PHART, HCO3 in the last 72 hours.  Invalid input(s): PCO2, PO2  Studies/Results: Ct Abdomen Pelvis W Contrast  06/07/2014   CLINICAL DATA:  Generalized abdominal pain. Worsening leukocytosis. Postop from partial colectomy with colostomy.  EXAM: CT ABDOMEN AND PELVIS WITH CONTRAST  TECHNIQUE: Multidetector CT imaging of the abdomen and pelvis was performed using the standard protocol following bolus administration of intravenous contrast.  CONTRAST:  OMNIPAQUE IOHEXOL 300 MG/ML  SOLN  COMPARISON:  05/23/2014  FINDINGS: Lower Chest: Increased bilateral lower lobe subsegmental atelectasis since prior exam.  Hepatobiliary: No masses or other significant abnormality identified. Gallbladder is unremarkable.  Pancreas: No mass, inflammatory changes, or other significant abnormality identified.  Spleen:  Within normal limits in size and appearance.   Adrenals:  No masses identified.  Kidneys/Urinary Tract:  No evidence of masses or hydronephrosis.  Stomach/Bowel/Peritoneum: Postop changes are seen from sigmoid colon resection and left lower quadrant colostomy. Decreased colonic dilatation is seen since previous study. Mild dilatation of small bowel loops is seen without evidence of focal transition point, likely due to postop ileus. No evidence of abscess or free fluid.  Vascular/Lymphatic: No pathologically enlarged lymph nodes identified. IVC filter remains in place. No other significant abnormality visualized.  Reproductive: Prior hysterectomy noted. Adnexal regions are unremarkable in appearance.  Other: Left hip prosthesis results in beam hardening artifact through the inferior aspect of the pelvis.  Musculoskeletal:  No suspicious bone lesions identified.  IMPRESSION: Expected postop changes from sigmoid colon resection with left lower quadrant colostomy. Decreased colonic dilatation, with mild postop ileus pattern.  No evidence of abscess or free fluid.  Increased bibasilar atelectasis.   Electronically Signed   By: Myles Rosenthal M.D.   On: 06/07/2014 20:36    Anti-infectives: Anti-infectives    Start     Dose/Rate Route Frequency Ordered Stop   06/07/14 1000  piperacillin-tazobactam (ZOSYN) IVPB 3.375 g     3.375 g 12.5 mL/hr over 240 Minutes Intravenous 3 times per day 06/07/14 0930     06/03/14 1615  gentamicin (GARAMYCIN) IVPB 100 mg  Status:  Discontinued    Comments:  Please make hang time appropriate with when pre-op abx was given   100 mg 200 mL/hr over 30 Minutes Intravenous  Once 06/03/14 1611 06/03/14 1616   06/03/14  1615  clindamycin (CLEOCIN) IVPB 900 mg  Status:  Discontinued     900 mg 100 mL/hr over 30 Minutes Intravenous  Once 06/03/14 1611 06/03/14 1617   06/03/14 0900  gentamicin (GARAMYCIN) IVPB 100 mg     100 mg 200 mL/hr over 30 Minutes Intravenous To [facility] 06/02/14 1111 06/03/14 1130   06/03/14 0900   clindamycin (CLEOCIN) IVPB 900 mg     900 mg 100 mL/hr over 30 Minutes Intravenous To [facility] 06/02/14 1208 06/03/14 1130   06/02/14 1115  clindamycin (CLEOCIN) IVPB 900 mg  Status:  Discontinued     900 mg 100 mL/hr over 30 Minutes Intravenous On call to O.R. 06/02/14 1111 06/02/14 1206      Assessment/Plan: s/p Procedure(s): PARTIAL COLECTOMY (N/A) COLOSTOMY (N/A) POD 5, s/p ex lap with partial colectomy and colostomy --> Dr. Luisa Hart  - ileus improved, start clears  LOS: 11 days    Yandell Mcjunkins E 06/09/2014

## 2014-06-09 NOTE — Consult Note (Signed)
WOC ostomy follow up Stoma type/location: colostomy LLQ Pt up in chair, reports pouch changed recently.  Per bedside nursing staff changed on night shift for leakage.  Stomal assessment/size: 1 1/2" flush per my partners report Peristomal assessment:  Treatment options for stomal/peristomal skin:  Output liquid brown stool  Ostomy pouching: 1pc.convex with barrier ring  Education provided: answered questions, she does not seem to be involved in the emptying will ask nursing staff to encourage patient.  Enrolled patient in [place] [facility] Discharge program: Yes Will likely per SW notes need rehab placement after DC.  WOC will continue to follow along with you for support with ostomy care.  Kelcie Currie Shelby RN,CWOCN [phone]

## 2014-06-09 NOTE — Progress Notes (Signed)
Physical medicine and rehabilitation consult requested with chart reviewed. At this time recommendations were made for skilled nursing facility as [facility] will not approve inpatient rehabilitation admission for this diagnosis. Hold on formal rehabilitation consult at this time with recommendations of skilled nursing facility

## 2014-06-09 NOTE — Progress Notes (Addendum)
ANTICOAGULATION CONSULT NOTE - Follow Up Consult  Pharmacy Consult for heparin Indication: DVT  Allergies  Allergen Reactions  . Augmentin [Amoxicillin-Pot Clavulanate] Other (See Comments)    Severe vaginal itching  . Tranxene [Clorazepate] Itching    Patient Measurements: Height: 5\' 7"  (170.2 cm) Weight: 200 lb 1.6 oz (90.765 kg) IBW/kg (Calculated) : 61.6 Heparin Dosing Weight:   Vital Signs: Temp: 100.4 F (38 C) (06/06 1229) Temp Source: Oral (06/06 1229) BP: 129/70 mmHg (06/06 1229) Pulse Rate: 107 (06/06 1229)  Labs:  Recent Labs  06/07/14 0522 06/08/14 0542 06/09/14 0556 06/09/14 0557 06/09/14 1410  HGB 10.3* 9.8* 9.5*  --   --   HCT 31.6* 29.8* 28.6*  --   --   PLT 207 218 223  --   --   LABPROT 15.3* 15.2  --  15.6*  --   INR 1.20 1.18  --  1.22  --   HEPARINUNFRC 0.55 0.32  --  0.27* 0.59  CREATININE 0.85 0.86 0.86  --   --     Estimated Creatinine Clearance: 67.4 mL/min (by C-G formula based on Cr of 0.86).   Medications:  Scheduled:  . antiseptic oral rinse  7 mL Mouth Rinse q12n4p  . chlorhexidine  15 mL Mouth Rinse BID  . insulin aspart  0-20 Units Subcutaneous 6 times per day  . levothyroxine  12.5 mcg Intravenous Daily  . magnesium sulfate 1 - 4 g bolus IVPB  2 g Intravenous Once  . metoprolol  5 mg Intravenous 4 times per day  . piperacillin-tazobactam (ZOSYN)  IV  3.375 g Intravenous 3 times per day   Infusions:  . Marland KitchenTPN (CLINIMIX-E) Adult 80 mL/hr at 06/08/14 1736   And  . fat emulsion 240 mL (06/08/14 1736)  . Marland KitchenTPN (CLINIMIX-E) Adult     And  . fat emulsion    . heparin 1,500 Units/hr (06/09/14 8016)  . lactated ringers 10 mL/hr at 06/03/14 1015    Assessment: 74 yo female with hx of DVT & PE is currently on therapeutic heparin.  Heparin level was therapeutic for multiple days but then was sub therapeutic this morning at 0.27. The dose was increased to 1500 units/hr and is now therapeutic at 0.59    S/P exp lap with partial  colectomy/colostomy 6/1. Hgb 9.5 and Plt 223 K (pod#5). No bleeding noted.  Patient was on warfarin PTA. Will need to wait until diet is advanced before reinitiating warfarin therapy.  Goal of Therapy:  Heparin level 0.3-0.7 units/ml Monitor platelets by anticoagulation protocol: Yes   Plan:  Continue Heparin 1500 units/hr Daily HL/CBC Monitor s/sx of bleeding F/u resume of coumadin (when ok w/ surgery and taking PO well; not yet)  Isaac Bliss, PharmD, BCPS Clinical Pharmacist Pager [phone] 06/09/2014 2:59 PM

## 2014-06-09 NOTE — Progress Notes (Signed)
Patient ID: Hannah [name], female   DOB: 11-10-1940, 74 y.o.   MRN: [id number] Wound examined - some fibrin slough upper portion. No purulence, no cellulitis. Continue wet to dry dressings. Violeta Gelinas, MD, MPH, FACS Trauma: [phone] General Surgery: [phone]

## 2014-06-09 NOTE — Progress Notes (Signed)
ANTICOAGULATION CONSULT NOTE - Follow Up Consult  Pharmacy Consult for heparin Indication: h/o PE/DVT   Labs:  Recent Labs  06/07/14 0522 06/08/14 0542 06/09/14 0556 06/09/14 0557  HGB 10.3* 9.8* 9.5*  --   HCT 31.6* 29.8* 28.6*  --   PLT 207 218 223  --   LABPROT 15.3* 15.2  --  15.6*  INR 1.20 1.18  --  1.22  HEPARINUNFRC 0.55 0.32  --  0.27*  CREATININE 0.85 0.86  --   --      Assessment: 74yo female now subtherapeutic on heparin after several levels at goal though had dropped to low end of goal yesterday.  Goal of Therapy:  Heparin level 0.3-0.7 units/ml   Plan:  Will increase heparin gtt slightly from 1400 units/hr to 1500 units/hr and check level in 6hr.  Vernard Gambles, PharmD, BCPS  06/09/2014,6:40 AM

## 2014-06-09 NOTE — Progress Notes (Signed)
Pt OOB to [facility] and to Chair, did well with 2 person assist, bares total weight on legs

## 2014-06-09 NOTE — Progress Notes (Signed)
Nutrition Follow-up  DOCUMENTATION CODES:  Not applicable  INTERVENTION:  TPN  NUTRITION DIAGNOSIS:  Inadequate oral intake related to altered GI function as evidenced by other (see comment) (clear liquids, prolonged TPN use).  Ongoing  GOAL:  Patient will meet greater than or equal to 90% of their needs  Met with TPN  MONITOR:  PO intake, Diet advancement, Labs, Weight trends, Skin, I & O's  REASON FOR ASSESSMENT:  Consult New TPN/TNA  ASSESSMENT: This is a 74 yo female w/ history DM, diverticulosis, DVT/PE on chronic Coumadin, HTN, and dyslipidemia. Presented w/ intractable N/V in setting of recent dx colonic stricture. Pt has presented to ER on multiple occasions this week for abdominal pain that has progressively worsened and has become assoc with N/V. Sx's began one week ago; CT abdomen 5/20 revealed stricture at desc and sigmoid colon. Plan was for pt to check w/ PCP re OK to hold Coumadin for colonoscopy then proceed as OP. On 5/21 INR was 5.49 so she was instructed to hold her Coumadin. Unfortunately her sx's worsened and she came to ER.  S/p Procedure(s) 05/30/14: FLEXIBLE SIGMOIDOSCOPY (N/A)  S/p Procedure(s) on 06/03/14: OPEN COLECTOMY WITH POSSIBLE COLOSTOMY (Left)  Reviewed [facility] note from 06/09/14- colostomy functioning well. Pouch changed during night shift due to leakage.   Pt pulled out NGT on 06/05/14, surgical orders indicated not to replace. Pt was just advanced to a clear liquid diet this AM, due to ileus improving.  Reviewed trauma note on surgical incision on 06/09/14; MD reports some fibrin slough upper portion with no purulence or cellulitis. Dressing frequency changed from BID to TID per surgery.  Paged by pharmacy to discuss pt's nutritional needs and TPN adequacy. RD adjusted nutritional needs due to wound healing. Per pharmacy note TPN to increase to Clinimix E 5/15 to 83 ml/hr + IVFE at 10 ml/hr to provide 1894 kCal and 100g protein per day, which  meets 98% of estimated kcal needs and 100% of protein needs.   CSW following. Potential for SNF at discharge.   Height:  Ht Readings from Last 1 Encounters:  05/29/14 '5\' 7"'  (1.702 m)    Weight:  Wt Readings from Last 1 Encounters:  05/29/14 200 lb 1.6 oz (90.765 kg)    Ideal Body Weight:  61.4 kg  Wt Readings from Last 10 Encounters:  05/29/14 200 lb 1.6 oz (90.765 kg)  05/25/14 208 lb (94.348 kg)  04/07/14 208 lb 12.8 oz (94.711 kg)  12/06/13 210 lb 3.2 oz (95.346 kg)  11/07/13 215 lb 12.8 oz (97.886 kg)  09/06/13 208 lb (94.348 kg)  06/06/13 212 lb 8 oz (96.389 kg)  03/12/13 213 lb 3.2 oz (96.707 kg)  03/07/13 214 lb 12.8 oz (97.433 kg)  02/25/13 210 lb (95.255 kg)    BMI:  Body mass index is 31.33 kg/(m^2).  Estimated Nutritional Needs:  Kcal:  1900-2100  Protein:  95-105 grams  Fluid:  1.9-2.1 L  Skin:  Wound (see comment) (closed abdominal incision)  Diet Order:  TPN (CLINIMIX-E) Adult Diet clear liquid Room service appropriate?: Yes; Fluid consistency:: Thin TPN (CLINIMIX-E) Adult  EDUCATION NEEDS:  No education needs identified at this time   Intake/Output Summary (Last 24 hours) at 06/09/14 1400 Last data filed at 06/09/14 1340  Gross per 24 hour  Intake      0 ml  Output    410 ml  Net   -410 ml    Last BM:  06/09/14  Lajuana Patchell A. Jimmye Norman, RD, LDN,  CDE Pager: [phone] After hours Pager: [phone]

## 2014-06-09 NOTE — Progress Notes (Signed)
[facility] PROGRESS NOTE  Hannah [name] ZOX:[id number] DOB: 01-Mar-1940 DOA: 05/29/2014  PCP: [name]., DO  Brief HPI:  74 yo female w/ history DM, diverticulosis, DVT/PE on chronic Coumadin, HTN, and dyslipidemia. Presented w/ intractable N/V in setting of recent dx colonic stricture. Pt had presented to ER on multiple occasions for abdominal pain that has progressively worsened and has become assoc with N/V. Sx's began one week ago; CT abdomen 5/20 revealed stricture at descending and sigmoid colon. Plan was for pt to check w/ PCP re OK to hold Coumadin for colonoscopy then proceed as OP. On 5/21 INR was 5.49 so she was instructed to hold her Coumadin. Unfortunately her sx's worsened and she came to ER. On colonoscopy, she was found to have stricture so surgery was consulted.She underwent exploratory laparotomy with partial colectomy and colostomy on May 31. Subsequently, patient started developing low-grade fever and her WBC started increasing. CT abdomen was unremarkable for infection. She does have some evidence for infection over her surgical wound. All other infectious workup was negative. She was started on Zosyn.  Past medical history:  Past Medical History  Diagnosis Date  . Thyroid disease   . DVT (deep venous thrombosis)   . Acute massive pulmonary embolism 07/13/2012    Massive PE w/ PEA arrest 07/13/12 >TNK >IVC filter >discharged on comadin    . Cardiogenic shock 07/13/2012  . Hemorrhagic shock 07/15/2012  . Acute renal failure 07/14/2012  . Acute respiratory failure with hypoxia 07/14/2012  . Hyperlipemia 02/25/2013  . Hypertension 06/06/2013  . Diabetes mellitus with neurological manifestations, controlled 06/06/2013    Consultants: Gen. Surgery, gastroenterology  Procedures:  Flexible sigmoidoscopy May 27 She underwent exploratory laparotomy with partial colectomy and colostomy on May 31  Antibiotics: Zosyn initiated 6/4  Subjective: Patient continues to feel  better. Denies any nausea or vomiting. No abdominal pain. No cough.    Objective: Vital Signs  Filed Vitals:   06/08/14 1324 06/08/14 2134 06/09/14 0017 06/09/14 0440  BP: 143/76 141/68 145/70 127/55  Pulse: 95 105 103 101  Temp: 99.7 F (37.6 C) 99.3 F (37.4 C) 98.9 F (37.2 C) 98.4 F (36.9 C)  TempSrc: Oral Oral Oral Oral  Resp: 18 18 18 18   Height:      Weight:      SpO2: 92% 99% 94% 97%    Intake/Output Summary (Last 24 hours) at 06/09/14 1005 Last data filed at 06/09/14 8119  Gross per 24 hour  Intake      0 ml  Output    550 ml  Net   -550 ml   Filed Weights   05/29/14 1733  Weight: 90.765 kg (200 lb 1.6 oz)    General appearance: alert, cooperative, but distracted, appears stated age, no distress and moderately obese Resp: Diminished air entry at the bases without any crackles or wheezing. Cardio: regular rate and rhythm, S1, S2 normal, no murmur, click, rub or gallop GI: Most of the abdomen covered with dressing. Per the surgeon the wound appears to be clean without any evidence for infection at this time. Previously a greenish yellow exudate had been noted. There is ostomy bag noted on the left. Bowel sounds heard, minimal. Extremities: extremities normal, atraumatic, no cyanosis or edema Neurologic: No focal deficits.  Lab Results:  Basic Metabolic Panel:  Recent Labs Lab 06/02/14 1035 06/05/14 0430 06/06/14 0617 06/07/14 0522 06/08/14 0542 06/09/14 0556  NA 135 128* 128* 132* 132* 134*  K 3.5 4.9 4.3 4.1  4.2 4.1  CL 99* 96* 97* 97* 99* 100*  CO2 GLUCOSE 172* 158* 121* 128* 92 73  BUN 16 55* 52* 37* 30* 23*  CREATININE 0.70 1.33* 0.99 0.85 0.86 0.86  CALCIUM 8.6* 8.7* 8.2* 8.4* 8.4* 8.3*  MG 1.6* 1.7 1.6* 1.8  --  1.6*  PHOS 3.1 5.0* 2.8 3.2  --  3.9   Liver Function Tests:  Recent Labs Lab 06/02/14 1035 06/05/14 0430 06/09/14 0556  AST 14* 27 22  ALT 12* 29 22  ALKPHOS 44 59 70  BILITOT 0.6 0.9 0.5  PROT 5.8*  5.5* 5.4*  ALBUMIN 2.8* 2.2* 1.8*   CBC:  Recent Labs Lab 06/05/14 0430 06/06/14 0617 06/07/14 0522 06/08/14 0542 06/09/14 0556  WBC 17.3* 18.3* 20.5* 19.6* 16.6*  NEUTROABS  --   --   --   --  12.4*  HGB 12.3 10.8* 10.3* 9.8* 9.5*  HCT 37.6 32.8* 31.6* 29.8* 28.6*  MCV 86.0 85.0 84.3 83.9 84.4  PLT 178 174 207 218 223   CBG:  Recent Labs Lab 06/08/14 1554 06/08/14 2010 06/09/14 0015 06/09/14 0434 06/09/14 0817  GLUCAP 108* 92 102* 86 102*    Recent Results (from the past 240 hour(s))  Surgical pcr screen     Status: None   Collection Time: 06/02/14  8:54 PM  Result Value Ref Range Status   MRSA, PCR NEGATIVE NEGATIVE Final   Staphylococcus aureus NEGATIVE NEGATIVE Final    Comment:        The Xpert SA Assay (FDA approved for NASAL specimens in patients over 44 years of age), is one component of a comprehensive surveillance program.  Test performance has been validated by [facility] for patients greater than or equal to 89 year old. It is not intended to diagnose infection nor to guide or monitor treatment.   Culture, blood (routine x 2)     Status: None (Preliminary result)   Collection Time: 06/07/14  9:50 AM  Result Value Ref Range Status   Specimen Description BLOOD  LEFT WRIST  Final   Special Requests BOTTLES DRAWN AEROBIC ONLY  8CC  Final   Culture   Final           BLOOD CULTURE RECEIVED NO GROWTH TO DATE CULTURE WILL BE HELD FOR 5 DAYS BEFORE ISSUING A FINAL NEGATIVE REPORT Performed at [facility]    Report Status PENDING  Incomplete  Culture, blood (routine x 2)     Status: None (Preliminary result)   Collection Time: 06/07/14  9:55 AM  Result Value Ref Range Status   Specimen Description BLOOD LEFT HAND  Final   Special Requests BOTTLES DRAWN AEROBIC ONLY 10CC  Final   Culture   Final           BLOOD CULTURE RECEIVED NO GROWTH TO DATE CULTURE WILL BE HELD FOR 5 DAYS BEFORE ISSUING A FINAL NEGATIVE REPORT Performed at [facility]    Report Status PENDING  Incomplete  Clostridium Difficile by PCR     Status: None   Collection Time: 06/07/14  1:21 PM  Result Value Ref Range Status   C difficile by pcr NEGATIVE NEGATIVE Final      Studies/Results: Ct Abdomen Pelvis W Contrast  06/07/2014   CLINICAL DATA:  Generalized abdominal pain. Worsening leukocytosis. Postop from partial colectomy with colostomy.  EXAM: CT ABDOMEN AND PELVIS WITH CONTRAST  TECHNIQUE: Multidetector CT imaging of the abdomen and  pelvis was performed using the standard protocol following bolus administration of intravenous contrast.  CONTRAST:  OMNIPAQUE IOHEXOL 300 MG/ML  SOLN  COMPARISON:  05/23/2014  FINDINGS: Lower Chest: Increased bilateral lower lobe subsegmental atelectasis since prior exam.  Hepatobiliary: No masses or other significant abnormality identified. Gallbladder is unremarkable.  Pancreas: No mass, inflammatory changes, or other significant abnormality identified.  Spleen:  Within normal limits in size and appearance.  Adrenals:  No masses identified.  Kidneys/Urinary Tract:  No evidence of masses or hydronephrosis.  Stomach/Bowel/Peritoneum: Postop changes are seen from sigmoid colon resection and left lower quadrant colostomy. Decreased colonic dilatation is seen since previous study. Mild dilatation of small bowel loops is seen without evidence of focal transition point, likely due to postop ileus. No evidence of abscess or free fluid.  Vascular/Lymphatic: No pathologically enlarged lymph nodes identified. IVC filter remains in place. No other significant abnormality visualized.  Reproductive: Prior hysterectomy noted. Adnexal regions are unremarkable in appearance.  Other: Left hip prosthesis results in beam hardening artifact through the inferior aspect of the pelvis.  Musculoskeletal:  No suspicious bone lesions identified.  IMPRESSION: Expected postop changes from sigmoid colon resection with left lower quadrant colostomy.  Decreased colonic dilatation, with mild postop ileus pattern.  No evidence of abscess or free fluid.  Increased bibasilar atelectasis.   Electronically Signed   By: Myles Rosenthal M.D.   On: 06/07/2014 20:36    Medications:  Scheduled: . antiseptic oral rinse  7 mL Mouth Rinse q12n4p  . chlorhexidine  15 mL Mouth Rinse BID  . insulin aspart  0-20 Units Subcutaneous 6 times per day  . levothyroxine  12.5 mcg Intravenous Daily  . metoprolol  5 mg Intravenous 4 times per day  . piperacillin-tazobactam (ZOSYN)  IV  3.375 g Intravenous 3 times per day  . sodium hypochlorite   Irrigation Daily   Continuous: . Marland KitchenTPN (CLINIMIX-E) Adult 80 mL/hr at 06/08/14 1736   And  . fat emulsion 240 mL (06/08/14 1736)  . heparin 1,500 Units/hr (06/09/14 5956)  . lactated ringers 10 mL/hr at 06/03/14 1015   LOV:FIEPPIRJJOA, HYDROmorphone (DILAUDID) injection, HYDROmorphone (DILAUDID) injection, ondansetron (ZOFRAN) IV, promethazine, sodium chloride  Assessment/Plan:  Principal Problem:   Colonic stricture Active Problems:   Long term current use of anticoagulant therapy   Hypothyroid   Hypertension   Diabetes mellitus with neurological manifestations, controlled   History of pulmonary embolism   Dehydration   Acute renal failure   Leukocytosis   Normocytic anemia    Sigmoid colon stricture She is status post partial colectomy and colostomy. General surgery is following. Initially seen by GI and underwent flexible sigmoidoscopy which showed sigmoid colon stricture. Subsequently Surgery was consulted and the patient underwent surgery. General surgery is following and managing. CT scan done yesterday does suggest postoperative ileus. Patient will remain nothing by mouth for now.  Dehydration/Acute renal failure/hyponatremia Sodium level is improved with infusion of normal saline. Creatinine has improved. Continue just TPN.   Low Grade Fever/Leukocytosis/Possible wound infection Patient was started  on Zosyn on 6/4 for presumed surgical wound infection. WBC has improved. Low-grade fevers appears to have resolved. UA is unremarkable for infection. Chest x-ray shows only atelectasis. CT scan did not show any intra-abdominal infection. Blood cultures are negative so far. Lactic acid is normal. Stool for C. difficile was negative. Continue to monitor for now.  Sinus tachycardia Has been persistent for the last many days. She does not appear to be symptomatic. Could be due  to her fever and acute illness. She is on beta blockers around the clock. Continue to monitor.  History of pulmonary embolism on anticoagulation with warfarin She was diagnosed with a submassive pulmonary embolism in July 2014. She actually had a PEA arrest and required thrombolytics. Considering this patient was placed on heparin bridge. She is currently off warfarin for the surgery. When okay with surgery and when she is able to take orally, warfarin to be resumed.  Hypothyroidism She is on Intravenous levothyroxin.  History of essential Hypertension PRN hydralazine and intravenous beta blocker  Diabetes mellitus with neurological manifestations, controlled SSI q 4 while NPO  Hypokalemia Repleted. Continue to monitor.  Normocytic anemia Some drop noted in Hemoglobin. No overt bleeding. Continue to monitor.  DVT Prophylaxis: On IV heparin    Code Status: Full code  Family Communication: Discussed with patient. No family at bedside today. Discussed with her daughter, Ms. Susann Givens 6/4. Disposition Plan:  PT and OT is following. CIR was recommended. She will go either to CIR or SNF.     LOS: 11 days   [facility]  [facility] Pager [phone] 06/09/2014, 10:05 AM  If 7PM-7AM, please contact night-coverage at [email or web address], password [facility]

## 2014-06-09 NOTE — Progress Notes (Signed)
PARENTERAL NUTRITION CONSULT NOTE - FOLLOW UP  Pharmacy Consult:  TPN Indication:  Colonic stricture  Allergies  Allergen Reactions  . Augmentin [Amoxicillin-Pot Clavulanate] Other (See Comments)    Severe vaginal itching  . Tranxene [Clorazepate] Itching    Patient Measurements: Height: '5\' 7"'  (170.2 cm) Weight: 200 lb 1.6 oz (90.765 kg) IBW/kg (Calculated) : 61.6  Vital Signs: Temp: 98.4 F (36.9 C) (06/06 0440) Temp Source: Oral (06/06 0440) BP: 127/55 mmHg (06/06 0440) Pulse Rate: 101 (06/06 0440) Intake/Output from previous day: 06/05 0701 - 06/06 0700 In: 0  Out: 570 [Urine:300; Stool:270]  Labs:  Recent Labs  06/07/14 0522 06/08/14 0542 06/09/14 0556 06/09/14 0557  WBC 20.5* 19.6* 16.6*  --   HGB 10.3* 9.8* 9.5*  --   HCT 31.6* 29.8* 28.6*  --   PLT 207 218 223  --   INR 1.20 1.18  --  1.22     Recent Labs  06/07/14 0522 06/08/14 0542 06/09/14 0556  NA 132* 132* 134*  K 4.1 4.2 4.1  CL 97* 99* 100*  CO2 '26 25 26  ' GLUCOSE 128* 92 73  BUN 37* 30* 23*  CREATININE 0.85 0.86 0.86  CALCIUM 8.4* 8.4* 8.3*  MG 1.8  --  1.6*  PHOS 3.2  --  3.9  PROT  --   --  5.4*  ALBUMIN  --   --  1.8*  AST  --   --  22  ALT  --   --  22  ALKPHOS  --   --  70  BILITOT  --   --  0.5  PREALBUMIN  --   --  7.2*  TRIG  --   --  61   Estimated Creatinine Clearance: 67.4 mL/min (by C-G formula based on Cr of 0.86).    Recent Labs  06/09/14 0015 06/09/14 0434 06/09/14 0817  GLUCAP 102* 86 102*     Insulin Requirements in the past 24 hours: 0 units moderate SSI + 75 units regular insulin in TPN  Assessment: 74 YOF found to have a colonic stricture with obstruction on colonoscopy 05/30/14.  S/p sigmoid colectomy and colostomy on 06/03/14.  She continues on TPN for nutritional support.  Surgeries/Procedures:      *5/27: colonoscopy showed colonic stricture found    *5/31: partial colectomy and colostomy GI: Prealbumin 11.8 >> 13>> decreased 7.2. Wt trending  down. CT 6/4 shows post-op ileus. NG removed 6/2 - may need NGT per surg if sxs persist but no more  N/V today. Clear liquid starting 6/6 with improving ileus Endo: hypothyroid on IV Synthroid.  DM - CBGs now on lower end of normal 86-108 with no SSI given in 24h Lytes: Na improved to 134, low CL 100, Mg low 1.6 (goal >=2 with ileus)  Renal: SCr stable at 0.86, CrCL 67. I/Os inaccurate. LR'@10'  Cards: HTN / HLD - BP soft-wnl. HR slightly tachy. IV Lopressor Hepatobil: LFTs wnl, alk phos, tbili remain wnl, TG WNL ID: Zosyn D#3 for surgical wound infxn? Afebrile, WBC down to 16.6. CT - no signs of intra-abdominal infxn. BCx2 >> ngtd. Cdiff neg Anticoag: hx DVT/PE s/p IVC filter, Coumadin PTA >> heparin gtt Best Practices: heparin gtt, MC TPN Access: PICC line placed 05/30/14 TPN start date: 05/30/14  Current Nutrition:  Clinimix Clear liquid diet  Nutritional Goals:  1850-2050 kCal, 95-100g protein per day   Plan:  - Increase Clinimix E 5/15 to 83 ml/hr + IVFE at 10 ml/hr to provide 1894  kCal and 100g protein per day to better meet patient's protein need, given prealbumin and weight trending down. TPN meeting 100% of patient's needs - Daily multivitamin and trace elements - Thiamine 379KC and folic acid 66m added to TPN - Decrease to 55 units regular insulin in TPN + SSI - Mg sulfate 2gm IV x 1 - F/U Mg in AM - F/u po intake and diet advancement  HElicia Lamp PharmD Clinical Pharmacist - Resident Pager [phone]/06/2014 9:15 AM

## 2014-06-10 LAB — CBC
HEMATOCRIT: 28.3 % — AB (ref 36.0–46.0)
Hemoglobin: 9.3 g/dL — ABNORMAL LOW (ref 12.0–15.0)
MCH: 27.6 pg (ref 26.0–34.0)
MCHC: 32.9 g/dL (ref 30.0–36.0)
MCV: 84 fL (ref 78.0–100.0)
PLATELETS: 220 10*3/uL (ref 150–400)
RBC: 3.37 MIL/uL — ABNORMAL LOW (ref 3.87–5.11)
RDW: 14.8 % (ref 11.5–15.5)
WBC: 15.1 10*3/uL — AB (ref 4.0–10.5)

## 2014-06-10 LAB — BASIC METABOLIC PANEL
Anion gap: 5 (ref 5–15)
BUN: 24 mg/dL — AB (ref 6–20)
CO2: 26 mmol/L (ref 22–32)
Calcium: 8.1 mg/dL — ABNORMAL LOW (ref 8.9–10.3)
Chloride: 99 mmol/L — ABNORMAL LOW (ref 101–111)
Creatinine, Ser: 0.97 mg/dL (ref 0.44–1.00)
GFR calc non Af Amer: 57 mL/min — ABNORMAL LOW (ref >60)
GLUCOSE: 114 mg/dL — AB (ref 65–99)
Potassium: 4 mmol/L (ref 3.5–5.1)
Sodium: 130 mmol/L — ABNORMAL LOW (ref 135–145)

## 2014-06-10 LAB — MAGNESIUM: Magnesium: 1.9 mg/dL (ref 1.7–2.4)

## 2014-06-10 LAB — GLUCOSE, CAPILLARY
GLUCOSE-CAPILLARY: 117 mg/dL — AB (ref 65–99)
Glucose-Capillary: 104 mg/dL — ABNORMAL HIGH (ref 65–99)
Glucose-Capillary: 108 mg/dL — ABNORMAL HIGH (ref 65–99)
Glucose-Capillary: 108 mg/dL — ABNORMAL HIGH (ref 65–99)
Glucose-Capillary: 114 mg/dL — ABNORMAL HIGH (ref 65–99)
Glucose-Capillary: 157 mg/dL — ABNORMAL HIGH (ref 65–99)

## 2014-06-10 LAB — PROTIME-INR
INR: 1.27 (ref 0.00–1.49)
PROTHROMBIN TIME: 16.1 s — AB (ref 11.6–15.2)

## 2014-06-10 LAB — HEPARIN LEVEL (UNFRACTIONATED): HEPARIN UNFRACTIONATED: 0.58 [IU]/mL (ref 0.30–0.70)

## 2014-06-10 MED ORDER — FAT EMULSION 20 % IV EMUL
240.0000 mL | INTRAVENOUS | Status: DC
Start: 2014-06-10 — End: 2014-06-11
  Administered 2014-06-10: 240 mL via INTRAVENOUS
  Filled 2014-06-10: qty 250

## 2014-06-10 MED ORDER — WARFARIN - PHARMACIST DOSING INPATIENT
Freq: Every day | Status: DC
  Administered 2014-06-10: 1
  Administered 2014-06-11 – 2014-06-15 (×3)

## 2014-06-10 MED ORDER — WARFARIN SODIUM 5 MG PO TABS
5.0000 mg | ORAL_TABLET | Freq: Once | ORAL | Status: AC
Start: 2014-06-10 — End: 2014-06-10
  Administered 2014-06-10: 5 mg via ORAL
  Filled 2014-06-10: qty 1

## 2014-06-10 MED ORDER — TRACE MINERALS CR-CU-F-FE-I-MN-MO-SE-ZN IV SOLN
INTRAVENOUS | Status: DC
  Administered 2014-06-10: 17:00:00 via INTRAVENOUS
  Filled 2014-06-10: qty 960

## 2014-06-10 MED ORDER — MAGNESIUM SULFATE 2 GM/50ML IV SOLN
2.0000 g | Freq: Once | INTRAVENOUS | Status: AC
  Administered 2014-06-10: 2 g via INTRAVENOUS
  Filled 2014-06-10: qty 50

## 2014-06-10 NOTE — Progress Notes (Signed)
7 Days Post-Op  Subjective: Tolerated some clears - did not take a lot but no N/V.  Objective: Vital signs in last 24 hours: Temp:  [99 F (37.2 C)-100.6 F (38.1 C)] 100.5 F (38.1 C) (06/07 0542) Pulse Rate:  [102-117] 102 (06/07 0542) Resp:  [16-22] 16 (06/07 0542) BP: (128-143)/(45-70) 131/48 mmHg (06/07 0014) SpO2:  [95 %-100 %] 98 % (06/07 0542) Weight:  [98.4 kg (216 lb 14.9 oz)] 98.4 kg (216 lb 14.9 oz) (06/07 0510) Last BM Date: 06/09/14  Intake/Output from previous day: 06/06 0701 - 06/07 0700 In: 0  Out: 720 [Urine:200; Stool:520] Intake/Output this shift:    General appearance: cooperative GI: stoma pink with stool output, wound cleaner with mild fibrin, soft  Lab Results:   Recent Labs  06/09/14 0556 06/10/14 0508  WBC 16.6* 15.1*  HGB 9.5* 9.3*  HCT 28.6* 28.3*  PLT 223 220   BMET  Recent Labs  06/09/14 0556 06/10/14 0508  NA 134* 130*  K 4.1 4.0  CL 100* 99*  CO2 26 26  GLUCOSE 73 114*  BUN 23* 24*  CREATININE 0.86 0.97  CALCIUM 8.3* 8.1*   PT/INR  Recent Labs  06/09/14 0557 06/10/14 0508  LABPROT 15.6* 16.1*  INR 1.22 1.27   ABG No results for input(s): PHART, HCO3 in the last 72 hours.  Invalid input(s): PCO2, PO2  Studies/Results: No results found.  Anti-infectives: Anti-infectives    Start     Dose/Rate Route Frequency Ordered Stop   06/07/14 1000  piperacillin-tazobactam (ZOSYN) IVPB 3.375 g     3.375 g 12.5 mL/hr over 240 Minutes Intravenous 3 times per day 06/07/14 0930     06/03/14 1615  gentamicin (GARAMYCIN) IVPB 100 mg  Status:  Discontinued    Comments:  Please make hang time appropriate with when pre-op abx was given   100 mg 200 mL/hr over 30 Minutes Intravenous  Once 06/03/14 1611 06/03/14 1616   06/03/14 1615  clindamycin (CLEOCIN) IVPB 900 mg  Status:  Discontinued     900 mg 100 mL/hr over 30 Minutes Intravenous  Once 06/03/14 1611 06/03/14 1617   06/03/14 0900  gentamicin (GARAMYCIN) IVPB 100 mg     100 mg 200 mL/hr over 30 Minutes Intravenous To [facility]Stay [facility] 06/02/14 1111 06/03/14 1130   06/03/14 0900  clindamycin (CLEOCIN) IVPB 900 mg     900 mg 100 mL/hr over 30 Minutes Intravenous To [facility]Stay Surgical 06/02/14 1208 06/03/14 1130   06/02/14 1115  clindamycin (CLEOCIN) IVPB 900 mg  Status:  Discontinued     900 mg 100 mL/hr over 30 Minutes Intravenous On call to O.R. 06/02/14 1111 06/02/14 1206      Assessment/Plan: s/p Procedure(s): PARTIAL COLECTOMY (N/A) COLOSTOMY (N/A) POD 6, s/p ex lap with partial colectomy and colostomy --> Dr. Luisa Hart  - advance diet  - wean TNA once taking PO better  - OK to start coumadin from our standpoint  LOS: 12 days    Katira Dumais E 06/10/2014

## 2014-06-10 NOTE — Progress Notes (Signed)
Occupational Therapy Treatment Patient Details Name: Shirla Hodgkiss MRN: [id number] DOB: Nov 16, 1940 Today's Date: 06/10/2014    History of present illness Pt is a 74 year old female with a PMH of DVT s/p IVC filter, HTN, DM, and diverticulitis admitted for abdominal pain, nausea, vomiting, and sigmoid colon stricture. Pt underwent exploratory lap with colectomy and colostomy on 06/03/14   OT comments  Patient making slow progress towards goals. Pt limited by decreased strength/endurance and decreased awareness, slow processing. Pt's daughter present during this OT session and seems supportive. Pt will need SNF for additional rehab for safety and to help increase overall independence prior to discharging > home.    Follow Up Recommendations  Supervision/Assistance - 24 hour;SNF    Equipment Recommendations  Other (comment) (TBD next venue of care)    Recommendations for Other Services  None at this time  Precautions / Restrictions Precautions Precautions: Fall Precaution Comments: colostomy Restrictions Weight Bearing Restrictions: No       Mobility Bed Mobility General bed mobility comments: Pt found in recliner upon OT entering/exiting room  Transfers Overall transfer level: Needs assistance Equipment used: Rolling walker (2 wheeled) Transfers: Sit to/from Stand Sit to Stand: +2 physical assistance;Mod assist General transfer comment: Verbal cues to scoot to edge of chair and bring feet back prior to stand. Assist to bring hips up.    Balance Overall balance assessment: Needs assistance Sitting-balance support: Bilateral upper extremity supported;Feet supported Sitting balance-Leahy Scale: Poor     Standing balance support: Bilateral upper extremity supported Standing balance-Leahy Scale: Poor Standing balance comment: walker and initial mod A due to posterior lean and then min A    ADL Overall ADL's : Needs assistance/impaired General ADL Comments: Pt follows one step  commands inconsistently and takes more than reasonable amount of time for processing. Attempted to have pt cross BLEs for LB ADLs, but pt unable. Educated pt and daughter on AE to help increase independence with this and handout given. Engaged pt in BUE HEP and encouraged pt to sit edge of recliner/unsupported. Frequent/max cueing needed to maintain balance unsupported.      Cognition   Behavior During Therapy: Flat affect Overall Cognitive Status: Impaired/Different from baseline Area of Impairment: Attention;Problem solving;Awareness;Following commands   Current Attention Level: Selective Memory: Decreased short-term memory  Following Commands: Follows one step commands inconsistently   Awareness: Emergent Problem Solving: Slow processing;Difficulty sequencing;Requires verbal cues;Requires tactile cues        Exercises General Exercises - Upper Extremity Shoulder Flexion: AAROM;Both;10 reps;Seated Shoulder Extension: AAROM;Both;10 reps;Seated Elbow Flexion: AROM;Both;10 reps;Seated Elbow Extension: AROM;Both;10 reps;Seated           Pertinent Vitals/ Pain       Pain Assessment: Faces Faces Pain Scale: Hurts little more Pain Location: abdomen Pain Descriptors / Indicators: Grimacing Pain Intervention(s): Limited activity within patient's tolerance;Monitored during session         Frequency Min 2X/week     Progress Toward Goals  OT Goals(current goals can now be found in the care plan section)  Progress towards OT goals: Progressing toward goals  Acute Rehab OT Goals Patient Stated Goal: to be independent  Plan Discharge plan needs to be updated    Activity Tolerance Patient tolerated treatment well   Patient Left in chair;with call bell/phone within reach;with family/visitor present    Time: 0865-7846 OT Time Calculation (min): 19 min  Charges: OT General Charges $OT Visit: 1 Procedure OT Treatments $Therapeutic Exercise: 8-22 mins  Lilya Smitherman , MS,  OTR/L, CLT  Pager: [phone]  06/10/2014, 12:12 PM

## 2014-06-10 NOTE — Progress Notes (Signed)
PARENTERAL NUTRITION CONSULT NOTE - FOLLOW UP  Pharmacy Consult:  TPN Indication:  Colonic stricture  Allergies  Allergen Reactions  . Augmentin [Amoxicillin-Pot Clavulanate] Other (See Comments)    Severe vaginal itching  . Tranxene [Clorazepate] Itching    Patient Measurements: Height: _0  (170.2 cm) Weight: 216 lb 14.9 oz (98.4 kg) IBW/kg (Calculated) : 61.6  Vital Signs: Temp: 100.5 F (38.1 C) (06/07 0542) Temp Source: Oral (06/07 0542) BP: 131/48 mmHg (06/07 0014) Pulse Rate: 102 (06/07 0542) Intake/Output from previous day: 06/06 0701 - 06/07 0700 In: 0  Out: 720 [Urine:200; Stool:520]  Labs:  Recent Labs  06/08/14 0542 06/09/14 0556 06/09/14 0557 06/10/14 0508  WBC 19.6* 16.6*  --  15.1*  HGB 9.8* 9.5*  --  9.3*  HCT 29.8* 28.6*  --  28.3*  PLT 218 223  --  220  INR 1.18  --  1.22 1.27     Recent Labs  06/08/14 0542 06/09/14 0556 06/10/14 0508  NA 132* 134* 130*  K 4.2 4.1 4.0  CL 99* 100* 99*  CO2 _1 GLUCOSE 92 73 114*  BUN 30* 23* 24*  CREATININE 0.86 0.86 0.97  CALCIUM 8.4* 8.3* 8.1*  MG  --  1.6* 1.9  PHOS  --  3.9  --   PROT  --  5.4*  --   ALBUMIN  --  1.8*  --   AST  --  22  --   ALT  --  22  --   ALKPHOS  --  70  --   BILITOT  --  0.5  --   PREALBUMIN  --  7.2*  --   TRIG  --  61  --    Estimated Creatinine Clearance: 62.2 mL/min (by C-G formula based on Cr of 0.97).    Recent Labs  06/10/14 0001 06/10/14 0408 06/10/14 0758  GLUCAP 108* 104* 117*     Insulin Requirements in the past 24 hours: 3 units moderate SSI + 75 units regular insulin in TPN  Assessment: 74 YOF found to have a colonic stricture with obstruction on colonoscopy 05/30/14.  S/p sigmoid colectomy and colostomy on 06/03/14.  She continues on TPN for nutritional support.  Surgeries/Procedures:      *5/27: colonoscopy showed colonic stricture found    *5/31: partial colectomy and colostomy GI: Prealbumin 11.8 >> 13>> decreased 7.2. Wt trending  down. CT 6/4 shows post-op ileus. NG removed 6/2 - may need NGT per surg if sxs persist but no further N/V. Clear liquid starting 6/6 with improving ileus - tolerating but did not take in much. To advance to soft diet today and wean TPN once taking better po. Pt states she tolerated clears yesterday and will try to eat BBQ sandwich today Endo: hypothyroid on IV Synthroid.  DM - CBGs controlled but lower range of goal 108-131 on SSI Lytes: Na decreased to 130, low CL 99, Mg low 1.9 after given Mg 2g on 6/6 (goal >=2 with ileus), K 4 (goal >/=4) Renal: SCr stable at 0.97, CrCL 62. I/Os inaccurate. LR_2  Cards: HTN / HLD - BP soft. Sinus tachy, not symptomatic. IV Lopressor Neuro: hydromorphone prn Hepatobil: LFTs wnl, alk phos, tbili remain wnl, TG WNL ID: Zosyn D#4 for presumed surgical wound infxn. Low-grade fever - Tmax/24 100.6, WBC down to 15.1. CT - no signs of intra-abdominal infxn. BCx2 >> ngtd. Cdiff neg Anticoag: hx DVT/PE s/p IVC filter, Coumadin PTA >> heparin gtt + warfarin Best Practices:  heparin gtt, MC TPN Access: PICC line placed 05/30/14 TPN start date: 05/30/14  Current Nutrition:  Clinimix Soft diet  Nutritional Goals:  1850-2050 kCal, 95-100g protein per day   Plan:  - Decrease Clinimix E 5/15 to 40 ml/hr + IVFE at 10 ml/hr to provide  1162 kCal and 48 g protein per day with patient starting soft diet.  - Daily multivitamin and trace elements - Thiamine 871UD and folic acid 61m added to TPN - Decrease to 20 units regular insulin in TPN + SSI (proportionate decrease with decreased TPN rate) - Mg sulfate 2gm IV x 1 - F/u po intake and ability to wean TPN  HElicia Lamp PharmD Clinical Pharmacist - Resident Pager [phone]/07/2014 8:36 AM

## 2014-06-10 NOTE — Progress Notes (Signed)
Utilization Review completed. Khamia Stambaugh RN BSN CM 

## 2014-06-10 NOTE — Progress Notes (Signed)
[facility] PROGRESS NOTE  Tharon Kitch ZOX:[id number] DOB: 1940/10/27 DOA: 05/29/2014  PCP: [name]., DO  Brief HPI:  74 yo female w/ history DM, diverticulosis, DVT/PE on chronic Coumadin, HTN, and dyslipidemia. Presented w/ intractable N/V in setting of recent dx colonic stricture. Pt had presented to ER on multiple occasions for abdominal pain that has progressively worsened and has become assoc with N/V. Sx's began one week ago; CT abdomen 5/20 revealed stricture at descending and sigmoid colon. Plan was for pt to check w/ PCP re OK to hold Coumadin for colonoscopy then proceed as OP. On 5/21 INR was 5.49 so she was instructed to hold her Coumadin. Unfortunately her sx's worsened and she came to ER. On colonoscopy, she was found to have stricture so surgery was consulted.She underwent exploratory laparotomy with partial colectomy and colostomy on May 31. Subsequently, patient started developing low-grade fever and her WBC started increasing. CT abdomen was unremarkable for infection. She does have some evidence for infection over her surgical wound. All other infectious workup was negative. She was started on Zosyn. Started on a diet by surgery.  Past medical history:  Past Medical History  Diagnosis Date  . Thyroid disease   . DVT (deep venous thrombosis)   . Acute massive pulmonary embolism 07/13/2012    Massive PE w/ PEA arrest 07/13/12 >TNK >IVC filter >discharged on comadin    . Cardiogenic shock 07/13/2012  . Hemorrhagic shock 07/15/2012  . Acute renal failure 07/14/2012  . Acute respiratory failure with hypoxia 07/14/2012  . Hyperlipemia 02/25/2013  . Hypertension 06/06/2013  . Diabetes mellitus with neurological manifestations, controlled 06/06/2013    Consultants: Gen. Surgery, gastroenterology  Procedures:  Flexible sigmoidoscopy May 27 She underwent exploratory laparotomy with partial colectomy and colostomy on May 31  Antibiotics: Zosyn initiated  6/4  Subjective: Patient continues to feel better. Denies any nausea or vomiting. No abdominal pain. No cough.    Objective: Vital Signs  Filed Vitals:   06/10/14 0014 06/10/14 0130 06/10/14 0510 06/10/14 0542  BP: 131/48     Pulse: 117   102  Temp: 100.3 F (37.9 C)   100.5 F (38.1 C)  TempSrc: Oral   Oral  Resp: Height:      Weight:   98.4 kg (216 lb 14.9 oz)   SpO2: 100%   98%    Intake/Output Summary (Last 24 hours) at 06/10/14 0850 Last data filed at 06/10/14 0600  Gross per 24 hour  Intake      0 ml  Output    310 ml  Net   -310 ml   Filed Weights   05/29/14 1733 06/10/14 0510  Weight: 90.765 kg (200 lb 1.6 oz) 98.4 kg (216 lb 14.9 oz)    General appearance: alert, cooperative, but distracted, appears stated age, no distress and moderately obese Resp: Diminished air entry at the bases without any crackles or wheezing. Cardio: regular rate and rhythm, S1, S2 normal, no murmur, click, rub or gallop GI: Most of the abdomen covered with dressing. Per the surgeon the wound appears to be clean without any evidence for infection at this time. Previously a greenish yellow exudate had been noted. There is ostomy bag noted on the left with brown stool. Bowel sounds heard, minimal. Extremities: extremities normal, atraumatic, no cyanosis or edema Neurologic: No focal deficits.  Lab Results:  Basic Metabolic Panel:  Recent Labs Lab 06/05/14 0430 06/06/14 8119 06/07/14 0522 06/08/14 1478 06/09/14 2956  06/10/14 0508  NA 128* 128* 132* 132* 134* 130*  K 4.9 4.3 4.1 4.2 4.1 4.0  CL 96* 97* 97* 99* 100* 99*  CO2 GLUCOSE 158* 121* 128* 92 73 114*  BUN 55* 52* 37* 30* 23* 24*  CREATININE 1.33* 0.99 0.85 0.86 0.86 0.97  CALCIUM 8.7* 8.2* 8.4* 8.4* 8.3* 8.1*  MG 1.7 1.6* 1.8  --  1.6* 1.9  PHOS 5.0* 2.8 3.2  --  3.9  --    Liver Function Tests:  Recent Labs Lab 06/05/14 0430 06/09/14 0556  AST 27 22  ALT 29 22  ALKPHOS 59 70   BILITOT 0.9 0.5  PROT 5.5* 5.4*  ALBUMIN 2.2* 1.8*   CBC:  Recent Labs Lab 06/06/14 0617 06/07/14 0522 06/08/14 0542 06/09/14 0556 06/10/14 0508  WBC 18.3* 20.5* 19.6* 16.6* 15.1*  NEUTROABS  --   --   --  12.4*  --   HGB 10.8* 10.3* 9.8* 9.5* 9.3*  HCT 32.8* 31.6* 29.8* 28.6* 28.3*  MCV 85.0 84.3 83.9 84.4 84.0  PLT 174 207 218 223 220   CBG:  Recent Labs Lab 06/09/14 1626 06/09/14 2006 06/10/14 0001 06/10/14 0408 06/10/14 0758  GLUCAP 107* 131* 108* 104* 117*    Recent Results (from the past 240 hour(s))  Surgical pcr screen     Status: None   Collection Time: 06/02/14  8:54 PM  Result Value Ref Range Status   MRSA, PCR NEGATIVE NEGATIVE Final   Staphylococcus aureus NEGATIVE NEGATIVE Final    Comment:        The Xpert SA Assay (FDA approved for NASAL specimens in patients over 88 years of age), is one component of a comprehensive surveillance program.  Test performance has been validated by [facility] for patients greater than or equal to 63 year old. It is not intended to diagnose infection nor to guide or monitor treatment.   Culture, blood (routine x 2)     Status: None (Preliminary result)   Collection Time: 06/07/14  9:50 AM  Result Value Ref Range Status   Specimen Description BLOOD  LEFT WRIST  Final   Special Requests BOTTLES DRAWN AEROBIC ONLY  8CC  Final   Culture   Final           BLOOD CULTURE RECEIVED NO GROWTH TO DATE CULTURE WILL BE HELD FOR 5 DAYS BEFORE ISSUING A FINAL NEGATIVE REPORT Performed at [facility]    Report Status PENDING  Incomplete  Culture, blood (routine x 2)     Status: None (Preliminary result)   Collection Time: 06/07/14  9:55 AM  Result Value Ref Range Status   Specimen Description BLOOD LEFT HAND  Final   Special Requests BOTTLES DRAWN AEROBIC ONLY 10CC  Final   Culture   Final           BLOOD CULTURE RECEIVED NO GROWTH TO DATE CULTURE WILL BE HELD FOR 5 DAYS BEFORE ISSUING A FINAL NEGATIVE  REPORT Performed at [facility]    Report Status PENDING  Incomplete  Clostridium Difficile by PCR     Status: None   Collection Time: 06/07/14  1:21 PM  Result Value Ref Range Status   C difficile by pcr NEGATIVE NEGATIVE Final      Studies/Results: No results found.  Medications:  Scheduled: . antiseptic oral rinse  7 mL Mouth Rinse q12n4p  . chlorhexidine  15 mL Mouth Rinse BID  . insulin aspart  0-20 Units Subcutaneous 6 times per day  . levothyroxine  12.5 mcg Intravenous Daily  . metoprolol  5 mg Intravenous 4 times per day  . piperacillin-tazobactam (ZOSYN)  IV  3.375 g Intravenous 3 times per day   Continuous: . Marland KitchenTPN (CLINIMIX-E) Adult 83 mL/hr at 06/09/14 1725   And  . fat emulsion 240 mL (06/09/14 1725)  . heparin 1,500 Units/hr (06/09/14 1737)  . lactated ringers 10 mL/hr at 06/03/14 1015   RXV:[id number], hydrALAZINE, HYDROmorphone (DILAUDID) injection, HYDROmorphone (DILAUDID) injection, ondansetron (ZOFRAN) IV, promethazine, sodium chloride  Assessment/Plan:  Principal Problem:   Colonic stricture Active Problems:   Long term current use of anticoagulant therapy   Hypothyroid   Hypertension   Diabetes mellitus with neurological manifestations, controlled   History of pulmonary embolism   Dehydration   Acute renal failure   Leukocytosis   Normocytic anemia    Sigmoid colon stricture She is status post partial colectomy and colostomy. General surgery is following. Initially seen by GI and underwent flexible sigmoidoscopy which showed sigmoid colon stricture. Subsequently Surgery was consulted and the patient underwent surgery. General surgery is following and managing. CT scan done postoperatively. Did suggest ileus. However, patient has done well. She is now tolerating liquids. Diet to be advanced by general surgery today. Once her oral intake is consistent hopefully, the TPN can be weaned off. Once oral intake is consistent medications can  be changed over to swell.  Dehydration/Acute renal failure/hyponatremia Sodium level is improved with infusion of normal saline. Creatinine has improved. Continue just TPN.   Low Grade Fever/Leukocytosis/Possible wound infection Patient was started on Zosyn on 6/4 for presumed surgical wound infection. WBC has improved. But unfortunately her low-grade fevers have recurred. Patient clinically looks much better. There is no evidence for any infection at this time based on extensive testing including chest x-ray, UA, blood cultures, lactic acid, CT of the abdomen. Continue to watch her for another day or 2. If this does not improve, may need to involve infectious diseases. UA is unremarkable for infection. Chest x-ray shows only atelectasis. CT scan did not show any intra-abdominal infection. Blood cultures are negative so far. Lactic acid is normal. Stool for C. difficile was negative. Continue to monitor for now.  Sinus tachycardia Seems to be improved. She does not appear to be symptomatic. Could be due to her fever and acute illness. She is on beta blockers around the clock. Continue to monitor.  History of pulmonary embolism on anticoagulation with warfarin She was diagnosed with a submassive pulmonary embolism in July 2014. She actually had a PEA arrest and required thrombolytics. Considering this patient was placed on heparin bridge. She is currently off warfarin for the surgery. Okay to resume warfarin today. Pharmacy to dose.   Hypothyroidism She is on Intravenous levothyroxin. Change over to oral tomorrow if she has tolerated her diet.  History of essential Hypertension PRN hydralazine and intravenous beta blocker. Change over to oral tomorrow if she tolerates her diet.  Diabetes mellitus with neurological manifestations, controlled SSI q 4 while NPO  Hypokalemia Repleted. Continue to monitor.  Normocytic anemia Some drop noted in Hemoglobin. No overt bleeding. Continue to  monitor.  DVT Prophylaxis: On IV heparin    Code Status: Full code  Family Communication: Discussed with patient. Discussed with her daughter in detail.  Disposition Plan:  PT and OT is following. CIR was recommended. She will go either to CIR or SNF.     LOS: 12 days   [facility]  [facility] Pager [phone] 06/10/2014, 8:50 AM  If 7PM-7AM, please contact night-coverage at [email or web address], password [facility]

## 2014-06-10 NOTE — Discharge Instructions (Addendum)
WOUND CARE -twice daily normal saline wet to dry dressing changes with 4x4 guaze, cover with an abdominal pain  Ostomy care -change as needed -ensure it continues to have output.  Follow up with [facility] [place] [facility]           Information on my medicine - Coumadin   (Warfarin)  This medication education was reviewed with me or my healthcare representative as part of my discharge preparation.   Why was Coumadin prescribed for you? Coumadin was prescribed for you because you have a blood clot or a medical condition that can cause an increased risk of forming blood clots. Blood clots can cause serious health problems by blocking the flow of blood to the heart, lung, or brain. Coumadin can prevent harmful blood clots from forming. As a reminder your indication for Coumadin is:   Deep Vein Thrombosis Treatment  What test will check on my response to Coumadin? While on Coumadin (warfarin) you will need to have an INR test regularly to ensure that your dose is keeping you in the desired range. The INR (international normalized ratio) number is calculated from the result of the laboratory test called prothrombin time (PT).  If an INR APPOINTMENT HAS NOT ALREADY BEEN MADE FOR YOU please schedule an appointment to have this lab work done by your health care provider within 7 days. Your INR goal is usually a number between:  2 to 3 or your provider may give you a more narrow range like 2-2.5.  Ask your health care provider during an office visit what your goal INR is.  What  do you need to  know  About  COUMADIN? Take Coumadin (warfarin) exactly as prescribed by your healthcare provider about the same time each day.  DO NOT stop taking without talking to the doctor who prescribed the medication.  Stopping without other blood clot prevention medication to take the place of Coumadin may increase your risk of developing a new clot or stroke.  Get refills before you run out.  What do you do  if you miss a dose? If you miss a dose, take it as soon as you remember on the same day then continue your regularly scheduled regimen the next day.  Do not take two doses of Coumadin at the same time.  Important Safety Information A possible side effect of Coumadin (Warfarin) is an increased risk of bleeding. You should call your healthcare provider right away if you experience any of the following: ? Bleeding from an injury or your nose that does not stop. ? Unusual colored urine (red or dark brown) or unusual colored stools (red or black). ? Unusual bruising for unknown reasons. ? A serious fall or if you hit your head (even if there is no bleeding).  Some foods or medicines interact with Coumadin (warfarin) and might alter your response to warfarin. To help avoid this: ? Eat a balanced diet, maintaining a consistent amount of Vitamin K. ? Notify your provider about major diet changes you plan to make. ? Avoid alcohol or limit your intake to 1 drink for women and 2 drinks for men per day. (1 drink is 5 oz. wine, 12 oz. beer, or 1.5 oz. liquor.)  Make sure that ANY health care provider who prescribes medication for you knows that you are taking Coumadin (warfarin).  Also make sure the healthcare provider who is monitoring your Coumadin knows when you have started a new medication including herbals and non-prescription products.  Coumadin (Warfarin)  Major Drug Interactions  Increased Warfarin Effect Decreased Warfarin Effect  Alcohol (large quantities) Antibiotics (esp. Septra/Bactrim, Flagyl, Cipro) Amiodarone (Cordarone) Aspirin (ASA) Cimetidine (Tagamet) Megestrol (Megace) NSAIDs (ibuprofen, naproxen, etc.) Piroxicam (Feldene) Propafenone (Rythmol SR) Propranolol (Inderal) Isoniazid (INH) Posaconazole (Noxafil) Barbiturates (Phenobarbital) Carbamazepine (Tegretol) Chlordiazepoxide (Librium) Cholestyramine (Questran) Griseofulvin Oral Contraceptives Rifampin Sucralfate  (Carafate) Vitamin K   Coumadin (Warfarin) Major Herbal Interactions  Increased Warfarin Effect Decreased Warfarin Effect  Garlic Ginseng Ginkgo biloba Coenzyme Q10 Green tea St. Johns wort    Coumadin (Warfarin) FOOD Interactions  Eat a consistent number of servings per week of foods HIGH in Vitamin K (1 serving =  cup)  Collards (cooked, or boiled & drained) Kale (cooked, or boiled & drained) Mustard greens (cooked, or boiled & drained) Parsley *serving size only =  cup Spinach (cooked, or boiled & drained) Swiss chard (cooked, or boiled & drained) Turnip greens (cooked, or boiled & drained)  Eat a consistent number of servings per week of foods MEDIUM-HIGH in Vitamin K (1 serving = 1 cup)  Asparagus (cooked, or boiled & drained) Broccoli (cooked, boiled & drained, or raw & chopped) Brussel sprouts (cooked, or boiled & drained) *serving size only =  cup Lettuce, raw (green leaf, endive, romaine) Spinach, raw Turnip greens, raw & chopped   These websites have more information on Coumadin (warfarin):  http://www.king-russell.com/; https://www.hines.net/;

## 2014-06-10 NOTE — Consult Note (Signed)
WOC visited this am to attempt ostomy education with patient and family. She is up in chair when I arrive today and it will be very difficult for patient and WOC nurse to change pouch when she is up in the chair. Her stoma is LLQ and quite lateral on the abdomen.  I have requested that patient not be put up in the chair until I arrive in the am for teaching.  Daughter at bedside and I have explained this to her as well.  Encouraged patient to participate with emptying the pouch when NT or RN in to assist.  Discussed with bedside nurse, patient is not mobile enough to be safe on BSC or in BR for teaching at this time.  Discussed with daughter that rehabilitation should also support ostomy teaching when she DC to rehab facility which per daughter is still plan.  WOC team will follow along with you for support with ostomy care and teaching. Marvel Sapp [facility] RN,CWOCN

## 2014-06-10 NOTE — Clinical Social Work Note (Signed)
CSW gave bed offers to patient and patient's daughter at bedside.   Roddie Mc MSW, [place], [place], [phone]

## 2014-06-10 NOTE — Progress Notes (Addendum)
ANTICOAGULATION CONSULT NOTE - Follow Up Consult  Pharmacy Consult for heparin and restart of warfarin Indication: DVT  Allergies  Allergen Reactions  . Augmentin [Amoxicillin-Pot Clavulanate] Other (See Comments)    Severe vaginal itching  . Tranxene [Clorazepate] Itching    Patient Measurements: Height: 5\' 7"  (170.2 cm) Weight: 216 lb 14.9 oz (98.4 kg) IBW/kg (Calculated) : 61.6 Heparin Dosing Weight:   Vital Signs: Temp: 100.5 F (38.1 C) (06/07 0542) Temp Source: Oral (06/07 0542) BP: 131/48 mmHg (06/07 0014) Pulse Rate: 102 (06/07 0542)  Labs:  Recent Labs  06/08/14 0542 06/09/14 0556 06/09/14 0557 06/09/14 1410 06/10/14 0508  HGB 9.8* 9.5*  --   --  9.3*  HCT 29.8* 28.6*  --   --  28.3*  PLT 218 223  --   --  220  LABPROT 15.2  --  15.6*  --  16.1*  INR 1.18  --  1.22  --  1.27  HEPARINUNFRC 0.32  --  0.27* 0.59 0.58  CREATININE 0.86 0.86  --   --  0.97    Estimated Creatinine Clearance: 62.2 mL/min (by C-G formula based on Cr of 0.97).   Medications:  Scheduled:  . antiseptic oral rinse  7 mL Mouth Rinse q12n4p  . chlorhexidine  15 mL Mouth Rinse BID  . insulin aspart  0-20 Units Subcutaneous 6 times per day  . levothyroxine  12.5 mcg Intravenous Daily  . metoprolol  5 mg Intravenous 4 times per day  . piperacillin-tazobactam (ZOSYN)  IV  3.375 g Intravenous 3 times per day   Infusions:  . Marland KitchenTPN (CLINIMIX-E) Adult 83 mL/hr at 06/09/14 1725   And  . fat emulsion 240 mL (06/09/14 1725)  . heparin 1,500 Units/hr (06/09/14 1737)  . lactated ringers 10 mL/hr at 06/03/14 1015    Assessment: 74 yo female with hx of DVT & PE is currently on therapeutic heparin.  Heparin level is now therapeutic at 0.59 (x 2 levels) with a rate of 1500 units/hr. INR today is 1.27.  S/P exp lap with partial colectomy/colostomy 6/1. Hgb 9.3 and Plt 220K (pod#6). No bleeding noted and CBC is stable.  Patient was on warfarin PTA. Diet advancing to soft today with physician  order to restart warfarin.  Goal of Therapy:  Heparin level 0.3-0.7 units/ml  INR goal of 2 - 3 Monitor platelets by anticoagulation protocol: Yes   Plan: Warfarin 5 mg x 1 tonight  Continue Heparin 1500 units/hr Daily HL, INR, CBC Monitor s/sx of bleeding  Isaac Bliss, PharmD, BCPS Clinical Pharmacist Pager [phone] 06/10/2014 8:01 AM

## 2014-06-10 NOTE — Progress Notes (Signed)
Physical Therapy Treatment Patient Details Name: Hannah [name] MRN: [id number] DOB: November 26, 1940 Today's Date: 06/10/2014    History of Present Illness Pt is a 74 year old female with a PMH of DVT s/p IVC filter, HTN, DM, and diverticulitis admitted for abdominal pain, nausea, vomiting, and sigmoid colon stricture. Pt underwent exploratory lap with colectomy and colostomy on 06/03/14    PT Comments    Pt making very slow progress.  Follow Up Recommendations  SNF     Equipment Recommendations  None recommended by PT    Recommendations for Other Services       Precautions / Restrictions Precautions Precautions: Fall Precaution Comments: colostomy    Mobility  Bed Mobility                  Transfers Overall transfer level: Needs assistance Equipment used: Rolling walker (2 wheeled) Transfers: Sit to/from Stand Sit to Stand: +2 physical assistance;Mod assist         General transfer comment: Verbal cues to scoot to edge of chair and bring feet back prior to stand. Assist to bring hips up.  Ambulation/Gait Ambulation/Gait assistance: Mod assist;+2 safety/equipment Ambulation Distance (Feet): 25 Feet Assistive device: Rolling walker (2 wheeled) Gait Pattern/deviations: Step-to pattern;Decreased step length - right;Decreased step length - left;Shuffle;Trunk flexed Gait velocity: very slow Gait velocity interpretation: Below normal speed for age/gender General Gait Details: Verbal cues to stand more erect and take longer steps.   Stairs            Wheelchair Mobility    Modified Rankin (Stroke Patients Only)       Balance Overall balance assessment: Needs assistance Sitting-balance support: No upper extremity supported Sitting balance-Leahy Scale: Fair     Standing balance support: Bilateral upper extremity supported Standing balance-Leahy Scale: Poor Standing balance comment: walker and initial mod A due to posterior lean and then min A                     Cognition Arousal/Alertness: Awake/alert Behavior During Therapy: Flat affect Overall Cognitive Status: Within Functional Limits for tasks assessed                      Exercises      General Comments        Pertinent Vitals/Pain Pain Assessment: Faces Faces Pain Scale: Hurts little more Pain Location: abdomen Pain Descriptors / Indicators: Grimacing;Operative site guarding Pain Intervention(s): Limited activity within patient's tolerance;Repositioned    Home Living                      Prior Function            PT Goals (current goals can now be found in the care plan section) Acute Rehab PT Goals Patient Stated Goal: to be independent Progress towards PT goals: Progressing toward goals    Frequency  Min 3X/week    PT Plan Current plan remains appropriate    Co-evaluation             End of Session Equipment Utilized During Treatment: Gait belt Activity Tolerance: Patient limited by fatigue Patient left: in chair;with call bell/phone within reach;with chair alarm set;with family/visitor present     Time: 0940-1000 PT Time Calculation (min) (ACUTE ONLY): 20 min  Charges:  $Gait Training: 8-22 mins                    G Codes:  Hannah [name] 06/10/2014, 10:17 AM  Skip Mayer PT [phone]

## 2014-06-11 DIAGNOSIS — E1149 Type 2 diabetes mellitus with other diabetic neurological complication: Secondary | ICD-10-CM

## 2014-06-11 DIAGNOSIS — R509 Fever, unspecified: Secondary | ICD-10-CM

## 2014-06-11 DIAGNOSIS — K5669 Other intestinal obstruction: Principal | ICD-10-CM

## 2014-06-11 DIAGNOSIS — N179 Acute kidney failure, unspecified: Secondary | ICD-10-CM

## 2014-06-11 LAB — BASIC METABOLIC PANEL
ANION GAP: 5 (ref 5–15)
BUN: 20 mg/dL (ref 6–20)
CO2: 25 mmol/L (ref 22–32)
Calcium: 8.1 mg/dL — ABNORMAL LOW (ref 8.9–10.3)
Chloride: 100 mmol/L — ABNORMAL LOW (ref 101–111)
Creatinine, Ser: 0.91 mg/dL (ref 0.44–1.00)
GFR calc Af Amer: >60 mL/min (ref >60)
GFR calc non Af Amer: >60 mL/min (ref >60)
Glucose, Bld: 116 mg/dL — ABNORMAL HIGH (ref 65–99)
POTASSIUM: 3.9 mmol/L (ref 3.5–5.1)
SODIUM: 130 mmol/L — AB (ref 135–145)

## 2014-06-11 LAB — GLUCOSE, CAPILLARY
Glucose-Capillary: 135 mg/dL — ABNORMAL HIGH (ref 65–99)
Glucose-Capillary: 118 mg/dL — ABNORMAL HIGH (ref 65–99)
Glucose-Capillary: 119 mg/dL — ABNORMAL HIGH (ref 65–99)
Glucose-Capillary: 124 mg/dL — ABNORMAL HIGH (ref 65–99)
Glucose-Capillary: 124 mg/dL — ABNORMAL HIGH (ref 65–99)
Glucose-Capillary: 114 mg/dL — ABNORMAL HIGH (ref 65–99)

## 2014-06-11 LAB — HEPARIN LEVEL (UNFRACTIONATED): HEPARIN UNFRACTIONATED: 0.66 [IU]/mL (ref 0.30–0.70)

## 2014-06-11 LAB — CBC
HCT: 27 % — ABNORMAL LOW (ref 36.0–46.0)
HEMOGLOBIN: 8.9 g/dL — AB (ref 12.0–15.0)
MCH: 27.8 pg (ref 26.0–34.0)
MCHC: 33 g/dL (ref 30.0–36.0)
MCV: 84.4 fL (ref 78.0–100.0)
Platelets: 211 10*3/uL (ref 150–400)
RBC: 3.2 MIL/uL — ABNORMAL LOW (ref 3.87–5.11)
RDW: 14.9 % (ref 11.5–15.5)
WBC: 13.7 10*3/uL — ABNORMAL HIGH (ref 4.0–10.5)

## 2014-06-11 LAB — PROTIME-INR
INR: 1.23 (ref 0.00–1.49)
Prothrombin Time: 15.7 seconds — ABNORMAL HIGH (ref 11.6–15.2)

## 2014-06-11 MED ORDER — FOLIC ACID 1 MG PO TABS
1.0000 mg | ORAL_TABLET | Freq: Every day | ORAL | Status: DC
  Administered 2014-06-11 – 2014-06-16 (×6): 1 mg via ORAL
  Filled 2014-06-11 (×6): qty 1

## 2014-06-11 MED ORDER — INSULIN ASPART 100 UNIT/ML ~~LOC~~ SOLN
0.0000 [IU] | Freq: Three times a day (TID) | SUBCUTANEOUS | Status: DC
  Administered 2014-06-11 – 2014-06-14 (×5): 1 [IU] via SUBCUTANEOUS

## 2014-06-11 MED ORDER — WARFARIN SODIUM 5 MG PO TABS
5.0000 mg | ORAL_TABLET | Freq: Once | ORAL | Status: AC
Start: 2014-06-11 — End: 2014-06-11
  Administered 2014-06-11: 5 mg via ORAL
  Filled 2014-06-11: qty 1

## 2014-06-11 MED ORDER — VITAMIN B-1 100 MG PO TABS
100.0000 mg | ORAL_TABLET | Freq: Every day | ORAL | Status: DC
  Administered 2014-06-11 – 2014-06-16 (×6): 100 mg via ORAL
  Filled 2014-06-11 (×6): qty 1

## 2014-06-11 MED ORDER — ENSURE ENLIVE PO LIQD
237.0000 mL | ORAL | Status: DC
  Administered 2014-06-11 – 2014-06-12 (×2): 237 mL via ORAL

## 2014-06-11 MED ORDER — LEVOTHYROXINE SODIUM 25 MCG PO TABS
25.0000 ug | ORAL_TABLET | Freq: Every day | ORAL | Status: DC
  Administered 2014-06-11 – 2014-06-16 (×6): 25 ug via ORAL
  Filled 2014-06-11 (×9): qty 1

## 2014-06-11 NOTE — Progress Notes (Signed)
ANTICOAGULATION CONSULT NOTE - Follow Up Consult  Pharmacy Consult for heparin and warfarin Indication: DVT  Allergies  Allergen Reactions  . Augmentin [Amoxicillin-Pot Clavulanate] Other (See Comments)    Severe vaginal itching  . Tranxene [Clorazepate] Itching    Patient Measurements: Height: 5\' 7"  (170.2 cm) Weight: 216 lb 14.9 oz (98.4 kg) IBW/kg (Calculated) : 61.6 Heparin Dosing Weight:   Vital Signs: Temp: 99 F (37.2 C) (06/08 0609) Temp Source: Oral (06/08 0609) BP: 119/45 mmHg (06/08 0609) Pulse Rate: 98 (06/08 0609)  Labs:  Recent Labs  06/09/14 0556  06/09/14 0557 06/09/14 1410 06/10/14 0508 06/11/14 0452  HGB 9.5*  --   --   --  9.3* 8.9*  HCT 28.6*  --   --   --  28.3* 27.0*  PLT 223  --   --   --  220 211  LABPROT  --   --  15.6*  --  16.1* 15.7*  INR  --   --  1.22  --  1.27 1.23  HEPARINUNFRC  --   < > 0.27* 0.59 0.58 0.66  CREATININE 0.86  --   --   --  0.97 0.91  < > = values in this interval not displayed.  Estimated Creatinine Clearance: 66.3 mL/min (by C-G formula based on Cr of 0.91).   Medications:  Scheduled:  . antiseptic oral rinse  7 mL Mouth Rinse q12n4p  . chlorhexidine  15 mL Mouth Rinse BID  . insulin aspart  0-20 Units Subcutaneous 6 times per day  . levothyroxine  12.5 mcg Intravenous Daily  . metoprolol  5 mg Intravenous 4 times per day  . piperacillin-tazobactam (ZOSYN)  IV  3.375 g Intravenous 3 times per day  . Warfarin - Pharmacist Dosing Inpatient   Does not apply q1800   Infusions:  . Marland KitchenTPN (CLINIMIX-E) Adult 40 mL/hr at 06/10/14 1657   And  . fat emulsion 240 mL (06/10/14 1658)  . heparin 1,500 Units/hr (06/10/14 2335)  . lactated ringers 10 mL/hr at 06/03/14 1015    Assessment: 74 yo female with hx of DVT & PE is currently on therapeutic heparin at a rate of 1500 units/hr. INR today is 1.23.  S/P exp lap with partial colectomy/colostomy 6/1. Hgb 8.9 and Plt 211K (pod#7). No bleeding noted.  Hg slight  drop.  Goal of Therapy:  Heparin level 0.3-0.7 units/ml  INR goal of 2 - 3 Monitor platelets by anticoagulation protocol: Yes   Plan: Warfarin 5 mg x 1 tonight  Continue Heparin 1500 units/hr Daily HL, INR, CBC Monitor s/sx of bleeding  Thanks for allowing pharmacy to be a part of this patient's care.  Talbert Cage, PharmD Clinical Pharmacist, [phone]  06/11/2014 8:15 AM

## 2014-06-11 NOTE — Consult Note (Signed)
WOC ostomy follow up Stoma type/location: LLQ, end colostomy Stomal assessment/size: 1 1/2" round, flush with skin, some mucosal sloughing Peristomal assessment: intact  Treatment options for stomal/peristomal skin: add 2" barrier ring for flush stoma Output liquid, brown/green effluent Ostomy pouching: 1pc. Convex pouch with 2" barrier ring Education provided: reviewed pouch change with patient, she has very flat affect. It is unclear if she will be able to manage this independently, she seems sad today.  However after we started the process she did participate in demonstrating Lock and roll closure for me twice and we reviewed using wick to clean the spigot of the pouch.  Would recommend HHRN to support her if she does dc to home.    Enrolled patient in [place] Secure Start Discharge program: Yes   WOC team will follow along with you for support with ostomy teaching.  Thanks  Shiasia Porro [employer], CWOCN [phone])

## 2014-06-11 NOTE — Progress Notes (Addendum)
Nutrition Follow-up  DOCUMENTATION CODES:  Obesity unspecified  INTERVENTION:  Ensure Enlive po daily, each supplement provides 350 kcal and 20 grams of protein  NUTRITION DIAGNOSIS:  Inadequate oral intake related to altered GI function as evidenced by other (see comment) (TPN weaning, just advanced to soft diet).  Ongoing  GOAL:  Patient will meet greater than or equal to 90% of their needs   MONITOR:  PO intake, Supplement acceptance, Labs, Weight trends, I & O's  REASON FOR ASSESSMENT:  Consult New TPN/TNA  ASSESSMENT: This is a 74 yo female w/ history DM, diverticulosis, DVT/PE on chronic Coumadin, HTN, and dyslipidemia. Presented w/ intractable N/V in setting of recent dx colonic stricture. Pt has presented to ER on multiple occasions this week for abdominal pain that has progressively worsened and has become assoc with N/V. Sx's began one week ago; CT abdomen 5/20 revealed stricture at desc and sigmoid colon. Plan was for pt to check w/ PCP re OK to hold Coumadin for colonoscopy then proceed as OP. On 5/21 INR was 5.49 so she was instructed to hold her Coumadin. Unfortunately her sx's worsened and she came to ER.  S/p Procedure(s) 05/30/14: FLEXIBLE SIGMOIDOSCOPY (N/A)  S/p Procedure(s) on 06/03/14: OPEN COLECTOMY WITH POSSIBLE COLOSTOMY (Left)  Spoke with pt at bedside. She is more alert today than she has been during previous visits from RD observation. Pt was advanced to a soft diet this AM. Staff report good appetite. However, pt does not recall how she did with her breakfast this AM. RD will add supplement to ensure nutritional adequacy.  Per pharmacy note, TPN is being weaned and will be d/c today at 1800 when complete bag is ran.   Reviewed COWRN note from 06/11/14; ostomy education continues and recommending HHRN for continued support.  Labs reviewed. Na: 130.   Height:  Ht Readings from Last 1 Encounters:  05/29/14 5\' 7"  (1.702 m)    Weight:  Wt  Readings from Last 1 Encounters:  06/10/14 216 lb 14.9 oz (98.4 kg)    Ideal Body Weight:  61.4 kg  Wt Readings from Last 10 Encounters:  06/10/14 216 lb 14.9 oz (98.4 kg)  05/25/14 208 lb (94.348 kg)  04/07/14 208 lb 12.8 oz (94.711 kg)  12/06/13 210 lb 3.2 oz (95.346 kg)  11/07/13 215 lb 12.8 oz (97.886 kg)  09/06/13 208 lb (94.348 kg)  06/06/13 212 lb 8 oz (96.389 kg)  03/12/13 213 lb 3.2 oz (96.707 kg)  03/07/13 214 lb 12.8 oz (97.433 kg)  02/25/13 210 lb (95.255 kg)    BMI:  Body mass index is 33.97 kg/(m^2).  Estimated Nutritional Needs:  Kcal:  1900-2100  Protein:  95-105 grams  Fluid:  1.9-2.1 L  Skin:  Wound (see comment) (closed abdominal incision)  Diet Order:  DIET SOFT Room service appropriate?: Yes; Fluid consistency:: Thin  EDUCATION NEEDS:  No education needs identified at this time   Intake/Output Summary (Last 24 hours) at 06/11/14 1220 Last data filed at 06/11/14 1000  Gross per 24 hour  Intake   1410 ml  Output    550 ml  Net    860 ml    Last BM:  06/10/14  Hannah [name] A. Mayford Knife, RD, LDN, CDE Pager: [phone] After hours Pager: [phone]

## 2014-06-11 NOTE — Progress Notes (Signed)
Patient ID: Hannah [name], female   DOB: 30-Aug-1940, 74 y.o.   MRN: [id number]     [address] [place], [place] [address]    Phone: [phone] FAX: [phone]     Subjective: Good appetite.  Ostomy functioning. Low grade temp.  WBC trending down.  Incontinent of urine.   Objective:  Vital signs:  Filed Vitals:   06/10/14 0542 06/10/14 1307 06/10/14 2242 06/11/14 0609  BP:  152/67 124/70 119/45  Pulse: 102 107 102 98  Temp: 100.5 F (38.1 C) 99.7 F (37.6 C) 99.2 F (37.3 C) 99 F (37.2 C)  TempSrc: Oral Oral Oral Oral  Resp: '16 18 18 18  ' Height:      Weight:      SpO2: 98% 98% 98% 100%    Last BM Date: 06/10/14  Intake/Output   Yesterday:  06/07 0701 - 06/08 0700 In: 1490 [P.O.:660; I.V.:180; IV Piggyback:50; TPN:600] Out: 550 [Stool:550] This shift:    I/O last 3 completed shifts: In: 0102 [P.O.:660; I.V.:180; IV Piggyback:50] Out: 1160 [Urine:300; Stool:860]    Physical Exam: General: Pt awake/alert/oriented x4 in no acute distress Abdomen: Soft. Nondistended. +bs. midline wound is clean with exception 2 dime sized areas right side which has fibrinous exudate.  Colostomy with stool. stoma is pink and viable.  No evidence of peritonitis. No incarcerated hernias.   Problem List:   Principal Problem:   Colonic stricture Active Problems:   Long term current use of anticoagulant therapy   Hypothyroid   Hypertension   Diabetes mellitus with neurological manifestations, controlled   History of pulmonary embolism   Dehydration   Acute renal failure   Leukocytosis   Normocytic anemia   Fever    Results:   Labs: Results for orders placed or performed during the hospital encounter of 05/29/14 (from the past 48 hour(s))  Glucose, capillary     Status: Abnormal   Collection Time: 06/09/14 12:27 PM  Result Value Ref Range   Glucose-Capillary 113 (H) 65 - 99 mg/dL  Heparin level  (unfractionated)     Status: None   Collection Time: 06/09/14  2:10 PM  Result Value Ref Range   Heparin Unfractionated 0.59 0.30 - 0.70 IU/mL    Comment:        IF HEPARIN RESULTS ARE BELOW EXPECTED VALUES, AND PATIENT DOSAGE HAS BEEN CONFIRMED, SUGGEST FOLLOW UP TESTING OF ANTITHROMBIN III LEVELS.   Glucose, capillary     Status: Abnormal   Collection Time: 06/09/14  4:26 PM  Result Value Ref Range   Glucose-Capillary 107 (H) 65 - 99 mg/dL  Glucose, capillary     Status: Abnormal   Collection Time: 06/09/14  8:06 PM  Result Value Ref Range   Glucose-Capillary 131 (H) 65 - 99 mg/dL   Comment 1 Notify RN   Glucose, capillary     Status: Abnormal   Collection Time: 06/10/14 12:01 AM  Result Value Ref Range   Glucose-Capillary 108 (H) 65 - 99 mg/dL  Glucose, capillary     Status: Abnormal   Collection Time: 06/10/14  4:08 AM  Result Value Ref Range   Glucose-Capillary 104 (H) 65 - 99 mg/dL  CBC     Status: Abnormal   Collection Time: 06/10/14  5:08 AM  Result Value Ref Range   WBC 15.1 (H) 4.0 - 10.5 K/uL   RBC 3.37 (L) 3.87 - 5.11 MIL/uL   Hemoglobin 9.3 (L) 12.0 -  15.0 g/dL   HCT 28.3 (L) 36.0 - 46.0 %   MCV 84.0 78.0 - 100.0 fL   MCH 27.6 26.0 - 34.0 pg   MCHC 32.9 30.0 - 36.0 g/dL   RDW 14.8 11.5 - 15.5 %   Platelets 220 150 - 400 K/uL  Heparin level (unfractionated)     Status: None   Collection Time: 06/10/14  5:08 AM  Result Value Ref Range   Heparin Unfractionated 0.58 0.30 - 0.70 IU/mL    Comment:        IF HEPARIN RESULTS ARE BELOW EXPECTED VALUES, AND PATIENT DOSAGE HAS BEEN CONFIRMED, SUGGEST FOLLOW UP TESTING OF ANTITHROMBIN III LEVELS.   Basic metabolic panel     Status: Abnormal   Collection Time: 06/10/14  5:08 AM  Result Value Ref Range   Sodium 130 (L) 135 - 145 mmol/L   Potassium 4.0 3.5 - 5.1 mmol/L   Chloride 99 (L) 101 - 111 mmol/L   CO2 26 22 - 32 mmol/L   Glucose, Bld 114 (H) 65 - 99 mg/dL   BUN 24 (H) 6 - 20 mg/dL   Creatinine, Ser  0.97 0.44 - 1.00 mg/dL   Calcium 8.1 (L) 8.9 - 10.3 mg/dL   GFR calc non Af Amer 57 (L) >60 mL/min   GFR calc Af Amer >60 >60 mL/min    Comment: (NOTE) The eGFR has been calculated using the CKD EPI equation. This calculation has not been validated in all clinical situations. eGFR's persistently <60 mL/min signify possible Chronic Kidney Disease.    Anion gap 5 5 - 15  Magnesium     Status: None   Collection Time: 06/10/14  5:08 AM  Result Value Ref Range   Magnesium 1.9 1.7 - 2.4 mg/dL  Protime-INR     Status: Abnormal   Collection Time: 06/10/14  5:08 AM  Result Value Ref Range   Prothrombin Time 16.1 (H) 11.6 - 15.2 seconds   INR 1.27 0.00 - 1.49  Glucose, capillary     Status: Abnormal   Collection Time: 06/10/14  7:58 AM  Result Value Ref Range   Glucose-Capillary 117 (H) 65 - 99 mg/dL  Glucose, capillary     Status: Abnormal   Collection Time: 06/10/14 11:48 AM  Result Value Ref Range   Glucose-Capillary 114 (H) 65 - 99 mg/dL  Glucose, capillary     Status: Abnormal   Collection Time: 06/10/14  4:45 PM  Result Value Ref Range   Glucose-Capillary 157 (H) 65 - 99 mg/dL  Glucose, capillary     Status: Abnormal   Collection Time: 06/10/14  8:32 PM  Result Value Ref Range   Glucose-Capillary 108 (H) 65 - 99 mg/dL  Glucose, capillary     Status: Abnormal   Collection Time: 06/11/14  1:32 AM  Result Value Ref Range   Glucose-Capillary 135 (H) 65 - 99 mg/dL  Glucose, capillary     Status: Abnormal   Collection Time: 06/11/14  4:07 AM  Result Value Ref Range   Glucose-Capillary 118 (H) 65 - 99 mg/dL  Protime-INR     Status: Abnormal   Collection Time: 06/11/14  4:52 AM  Result Value Ref Range   Prothrombin Time 15.7 (H) 11.6 - 15.2 seconds   INR 1.23 0.00 - 1.49  CBC     Status: Abnormal   Collection Time: 06/11/14  4:52 AM  Result Value Ref Range   WBC 13.7 (H) 4.0 - 10.5 K/uL   RBC 3.20 (L) 3.87 -  5.11 MIL/uL   Hemoglobin 8.9 (L) 12.0 - 15.0 g/dL   HCT 27.0 (L)  36.0 - 46.0 %   MCV 84.4 78.0 - 100.0 fL   MCH 27.8 26.0 - 34.0 pg   MCHC 33.0 30.0 - 36.0 g/dL   RDW 14.9 11.5 - 15.5 %   Platelets 211 150 - 400 K/uL  Basic metabolic panel     Status: Abnormal   Collection Time: 06/11/14  4:52 AM  Result Value Ref Range   Sodium 130 (L) 135 - 145 mmol/L   Potassium 3.9 3.5 - 5.1 mmol/L   Chloride 100 (L) 101 - 111 mmol/L   CO2 25 22 - 32 mmol/L   Glucose, Bld 116 (H) 65 - 99 mg/dL   BUN 20 6 - 20 mg/dL   Creatinine, Ser 0.91 0.44 - 1.00 mg/dL   Calcium 8.1 (L) 8.9 - 10.3 mg/dL   GFR calc non Af Amer >60 >60 mL/min   GFR calc Af Amer >60 >60 mL/min    Comment: (NOTE) The eGFR has been calculated using the CKD EPI equation. This calculation has not been validated in all clinical situations. eGFR's persistently <60 mL/min signify possible Chronic Kidney Disease.    Anion gap 5 5 - 15  Heparin level (unfractionated)     Status: None   Collection Time: 06/11/14  4:52 AM  Result Value Ref Range   Heparin Unfractionated 0.66 0.30 - 0.70 IU/mL    Comment:        IF HEPARIN RESULTS ARE BELOW EXPECTED VALUES, AND PATIENT DOSAGE HAS BEEN CONFIRMED, SUGGEST FOLLOW UP TESTING OF ANTITHROMBIN III LEVELS.   Glucose, capillary     Status: Abnormal   Collection Time: 06/11/14  7:54 AM  Result Value Ref Range   Glucose-Capillary 119 (H) 65 - 99 mg/dL    Imaging / Studies: No results found.  Medications / Allergies:  Scheduled Meds: . antiseptic oral rinse  7 mL Mouth Rinse q12n4p  . chlorhexidine  15 mL Mouth Rinse BID  . insulin aspart  0-20 Units Subcutaneous 6 times per day  . levothyroxine  12.5 mcg Intravenous Daily  . metoprolol  5 mg Intravenous 4 times per day  . piperacillin-tazobactam (ZOSYN)  IV  3.375 g Intravenous 3 times per day  . warfarin  5 mg Oral ONCE-1800  . Warfarin - Pharmacist Dosing Inpatient   Does not apply q1800   Continuous Infusions: . heparin 1,500 Units/hr (06/10/14 2335)  . lactated ringers 10 mL/hr at  06/03/14 1015   PRN Meds:.acetaminophen, hydrALAZINE, HYDROmorphone (DILAUDID) injection, HYDROmorphone (DILAUDID) injection, ondansetron (ZOFRAN) IV, promethazine, sodium chloride  Antibiotics: Anti-infectives    Start     Dose/Rate Route Frequency Ordered Stop   06/07/14 1000  piperacillin-tazobactam (ZOSYN) IVPB 3.375 g     3.375 g 12.5 mL/hr over 240 Minutes Intravenous 3 times per day 06/07/14 0930     06/03/14 1615  gentamicin (GARAMYCIN) IVPB 100 mg  Status:  Discontinued    Comments:  Please make hang time appropriate with when pre-op abx was given   100 mg 200 mL/hr over 30 Minutes Intravenous  Once 06/03/14 1611 06/03/14 1616   06/03/14 1615  clindamycin (CLEOCIN) IVPB 900 mg  Status:  Discontinued     900 mg 100 mL/hr over 30 Minutes Intravenous  Once 06/03/14 1611 06/03/14 1617   06/03/14 0900  gentamicin (GARAMYCIN) IVPB 100 mg     100 mg 200 mL/hr over 30 Minutes Intravenous To [facility]Stay [facility] 06/02/14  1111 06/03/14 1130   06/03/14 0900  clindamycin (CLEOCIN) IVPB 900 mg     900 mg 100 mL/hr over 30 Minutes Intravenous To [facility]Stay Surgical 06/02/14 1208 06/03/14 1130   06/02/14 1115  clindamycin (CLEOCIN) IVPB 900 mg  Status:  Discontinued     900 mg 100 mL/hr over 30 Minutes Intravenous On call to O.R. 06/02/14 1111 06/02/14 1206       Assessment/Plan Benign colonic stricture POD 8, s/p ex lap with partial colectomy and colostomy --> Dr. Brantley Stage -PT for mobilization -mobilize and pulm toilet -WOC following patient for new colostomy -wound is clean, BID Wet to dry dressing changes Leukocytosis -CT of abdomen 6/4 without surgical complications to explain leukocytosis. WBC is trending down, but low grade temps.  On Zosyn empirically.   DVT prophylaxis -heparin drip (h/o PE)/SCDs, coumadin FEN-DC TPN, continue with current diet Dispo-continue inpatient for fevers    Erby Pian, [facility] Surgery Pager [phone](7A-4:30P) For consults  and floor pages call [phone](7A-4:30P)  06/11/2014 9:19 AM

## 2014-06-11 NOTE — Clinical Social Work Note (Signed)
Patient's daughter states that patient and family will have decision on which SNF they want by in the morning.   Roddie Mc MSW, [place], [place], [phone]

## 2014-06-11 NOTE — Progress Notes (Signed)
PROGRESS NOTE  Hannah [name] ZOX:[id number] DOB: March 23, 1940 DOA: 05/29/2014 PCP: [name]., DO Brief summary 74 yo female w/ history DM, diverticulosis, DVT/PE on chronic Coumadin, HTN, and dyslipidemia. Presented w/ intractable N/V in setting of recent dx colonic stricture. Pt had presented to ER on multiple occasions for abdominal pain that has progressively worsened and has become assoc with N/V. Sx's began one week ago; CT abdomen 5/20 revealed stricture at descending and sigmoid colon. Plan was for pt to check w/ PCP re OK to hold Coumadin for colonoscopy then proceed as OP. On 5/21 INR was 5.49 so she was instructed to hold her Coumadin. Unfortunately her sx's worsened and she came to ER. On colonoscopy, she was found to have stricture so surgery was consulted.She underwent exploratory laparotomy with partial colectomy and colostomy on May 31. Subsequently, patient started developing low-grade fever and her WBC started increasing. CT abdomen was unremarkable for infection. She does have some evidence for infection over her surgical wound. All other infectious workup was negative. She was started on Zosyn 06/07/2014. Started on a diet by surgery.  Assessment/Plan: Sigmoid colon stricture/postoperative ileus She is status post partial colectomy and colostomy. General surgery is following. Initially seen by GI and underwent flexible sigmoidoscopy which showed sigmoid colon stricture. Subsequently Surgery was consulted and the patient underwent surgery. General surgery is following and managing. CT scan done postoperatively. Did suggest ileus. However, patient has done well. She is now tolerating liquids. Diet to be advanced by general surgery today.  -wean TPN per surgery -continue soft diet  Dehydration/Acute renal failure/hyponatremia Sodium level is improved/stable with infusion of normal saline/TPN.  -Creatinine has improved.  -wean TPN per surgery  Low Grade  Fever/Leukocytosis/Possible wound infection -Patient was started on Zosyn on 6/4 for presumed surgical wound infection.  -WBC has improved. -Suspect low-grade fevers may be due to continued atelectasis -Encouraged and incentive spirometry -Increase activity -no evidence for any infection at this time based on extensive testing including chest x-ray, UA, blood cultures, lactic acid, CT of the abdomen.  -Continue to watch her for another day or 2. If this does not improve, may need to involve infectious diseases.  -UA is unremarkable for infection.  -Chest x-ray shows only atelectasis. CT scan did not show any intra-abdominal infection. Blood cultures are negative so far. Lactic acid is normal.  -Stool for C. difficile was negative.  -am CBC with diff  Sinus tachycardia Seems to be improved. She does not appear to be symptomatic. Could be due to her fever and acute illness. She is on beta blockers around the clock. Continue to monitor.  History of pulmonary embolism on anticoagulation with warfarin She was diagnosed with a submassive pulmonary embolism in July 2014. She actually had a PEA arrest and required thrombolytics. -06/10/2014--warfarin restarted -Continue heparin bridge -Patient has IVC filter  Hypothyroidism She is on Intravenous levothyroxine. Change over to oral tomorrow if she has tolerated her diet.  History of essential Hypertension PRN hydralazine and intravenous beta blocker. Change over to oral tomorrow if she tolerates her diet.  Diabetes mellitus with neurological manifestations, controlled -change SSI to q ac and hs -04/09/2014 hemoglobin A1c 7.3  Hypokalemia Repleted. Continue to monitor.  Normocytic anemia Some drop noted in Hemoglobin. No overt bleeding. Continue to monitor.  DVT Prophylaxis: On IV heparin  Code Status: Full code  Family Communication: Discussed with patient.  Disposition Plan:SNF in 2-3 days when cleared by  surgery  Procedures/Studies: Ct Abdomen Pelvis W Contrast  06/07/2014   CLINICAL DATA:  Generalized abdominal pain. Worsening leukocytosis. Postop from partial colectomy with colostomy.  EXAM: CT ABDOMEN AND PELVIS WITH CONTRAST  TECHNIQUE: Multidetector CT imaging of the abdomen and pelvis was performed using the standard protocol following bolus administration of intravenous contrast.  CONTRAST:  OMNIPAQUE IOHEXOL 300 MG/ML  SOLN  COMPARISON:  05/23/2014  FINDINGS: Lower Chest: Increased bilateral lower lobe subsegmental atelectasis since prior exam.  Hepatobiliary: No masses or other significant abnormality identified. Gallbladder is unremarkable.  Pancreas: No mass, inflammatory changes, or other significant abnormality identified.  Spleen:  Within normal limits in size and appearance.  Adrenals:  No masses identified.  Kidneys/Urinary Tract:  No evidence of masses or hydronephrosis.  Stomach/Bowel/Peritoneum: Postop changes are seen from sigmoid colon resection and left lower quadrant colostomy. Decreased colonic dilatation is seen since previous study. Mild dilatation of small bowel loops is seen without evidence of focal transition point, likely due to postop ileus. No evidence of abscess or free fluid.  Vascular/Lymphatic: No pathologically enlarged lymph nodes identified. IVC filter remains in place. No other significant abnormality visualized.  Reproductive: Prior hysterectomy noted. Adnexal regions are unremarkable in appearance.  Other: Left hip prosthesis results in beam hardening artifact through the inferior aspect of the pelvis.  Musculoskeletal:  No suspicious bone lesions identified.  IMPRESSION: Expected postop changes from sigmoid colon resection with left lower quadrant colostomy. Decreased colonic dilatation, with mild postop ileus pattern.  No evidence of abscess or free fluid.  Increased bibasilar atelectasis.   Electronically Signed   By: Myles Rosenthal M.D.   On: 06/07/2014  20:36   Dg Chest Port 1 View  06/06/2014   CLINICAL DATA:  Fever, history of DVT, pulmonary embolism, hypertension, diabetes  EXAM: PORTABLE CHEST - 1 VIEW  COMPARISON:  Portable exam 0855 hours compared 08/29/2012  FINDINGS: RIGHT arm PICC line tip projects over cavoatrial junction.  Enlargement of cardiac silhouette with pulmonary vascular congestion.  Atherosclerotic calcification aorta.  Chronic elevation RIGHT diaphragm.  Decreased lung volumes with RIGHT basilar atelectasis.  No definite infiltrate, pleural effusion or pneumothorax.  IMPRESSION: Decreased lung volumes with RIGHT basilar atelectasis.  Enlargement of cardiac silhouette with pulmonary vascular congestion.   Electronically Signed   By: Ulyses Southward M.D.   On: 06/06/2014 09:07   Dg Abd 2 Views  05/27/2014   CLINICAL DATA:  Generalized abdominal pain and vomiting for 5 days.  EXAM: ABDOMEN - 2 VIEW  COMPARISON:  CT 05/23/2014  FINDINGS: There is gaseous distention of the colon. No small bowel dilatation to suggest small bowel obstruction. No free air, organomegaly or suspicious calcification. IVC filter in place. Prior left hip replacement.  IMPRESSION: Mild gaseous distention of the colon, possibly mild ileus.   Electronically Signed   By: Charlett Nose M.D.   On: 05/27/2014 16:07   Ct Cta Abd/pel W/cm &/or W/o Cm  05/23/2014   CLINICAL DATA:  Diffuse abdominal pain.  EXAM: CTA ABDOMEN AND PELVIS WITH CONTRAST  TECHNIQUE: Multidetector CT imaging of the abdomen and pelvis was performed using the standard protocol during bolus administration of intravenous contrast. Multiplanar reconstructed images and MIPs were obtained and reviewed to evaluate the vascular anatomy.  CONTRAST:  43mL OMNIPAQUE IOHEXOL 350 MG/ML SOLN  COMPARISON:  CT scan dated 07/15/2012  FINDINGS: There is minimal calcification in the abdominal aorta and iliac arteries. Superior mesenteric, inferior mesenteric and celiac arteries are widely patent. There are 2 widely patent  artery to each each kidney.  The superior mesenteric vein and portal vein are normal.  There is a small amount of ascites anterior to the right lobe of the liver just below the diaphragm. Liver is otherwise normal. Biliary tree, spleen, pancreas, adrenal glands, and kidneys demonstrate no significant abnormality. There is slight hyperplasia of both adrenal glands, unchanged.  There is diffuse distention of the colon to the proximal sigmoid region. The colon is filled with air and fluid. No dilated small bowel. Terminal ileum and appendix are normal.  There is a transition point at the junction of the descending colon with the sigmoid. There is a small amount of free fluid in the left pericolic gutter adjacent to this transition point and also small amount of free fluid in the pelvis. There are multiple diverticula in the sigmoid colon below the transition point.  The patient has had diverticulitis in this area in the past. This may represent a diverticular stricture although I cannot exclude a mucosal mass at this transition point.  Filter in the IVC.  No acute osseous abnormalities.  Rectum appears normal. Bladder is normal. Uterus appears to have been removed.  Review of the MIP images confirms the above findings.  IMPRESSION: 1. No significant vascular abnormality of the abdomen. No arterial stenoses. Widely patent SMA and SMV. 2. Diffuse dilatation of the fluid-filled colon to the junction of the descending and sigmoid portions. The possibility of a stricture secondary to prior diverticulitis or mucosal mass should be considered. 3. Small amount of ascites in the abdomen.   Electronically Signed   By: Francene Boyers M.D.   On: 05/23/2014 13:04         Subjective: Patient denies any headache, chest pain, shortness breath, nausea, vomiting, diarrhea, abdominal pain, dysuria, hematuria. No blood in the ostomy. She feels weak generally. She is able to tolerate small amounts of soft  food.  Objective: Filed Vitals:   06/10/14 0542 06/10/14 1307 06/10/14 2242 06/11/14 0609  BP:  152/67 124/70 119/45  Pulse: 102 107 102 98  Temp: 100.5 F (38.1 C) 99.7 F (37.6 C) 99.2 F (37.3 C) 99 F (37.2 C)  TempSrc: Oral Oral Oral Oral  Resp: 16 18 18 18   Height:      Weight:      SpO2: 98% 98% 98% 100%    Intake/Output Summary (Last 24 hours) at 06/11/14 0812 Last data filed at 06/11/14 0700  Gross per 24 hour  Intake   1490 ml  Output    550 ml  Net    940 ml   Weight change:  Exam:   General:  Pt is alert, follows commands appropriately, not in acute distress  HEENT: No icterus, No thrush, No neck mass, [place]/AT; no meningismus  Cardiovascular: RRR, S1/S2, no rubs, no gallops  Respiratory: Bibasilar crackles. No wheeze. Good air movement.  Abdomen: Soft/+BS, non tender, non distended, no guarding; no hepatosplenomegaly  Extremities: 1+LE edema, No lymphangitis, No petechiae, No rashes, no synovitis  Data Reviewed: Basic Metabolic Panel:  Recent Labs Lab 06/05/14 0430 06/06/14 0617 06/07/14 0522 06/08/14 0542 06/09/14 0556 06/10/14 0508 06/11/14 0452  NA 128* 128* 132* 132* 134* 130* 130*  K 4.9 4.3 4.1 4.2 4.1 4.0 3.9  CL 96* 97* 97* 99* 100* 99* 100*  CO2 23 24 26 25 26 26 25   GLUCOSE 158* 121* 128* 92 73 114* 116*  BUN 55* 52* 37* 30* 23* 24* 20  CREATININE 1.33* 0.99 0.85 0.86 0.86 0.97  0.91  CALCIUM 8.7* 8.2* 8.4* 8.4* 8.3* 8.1* 8.1*  MG 1.7 1.6* 1.8  --  1.6* 1.9  --   PHOS 5.0* 2.8 3.2  --  3.9  --   --    Liver Function Tests:  Recent Labs Lab 06/05/14 0430 06/09/14 0556  AST 27 22  ALT 29 22  ALKPHOS 59 70  BILITOT 0.9 0.5  PROT 5.5* 5.4*  ALBUMIN 2.2* 1.8*   No results for input(s): LIPASE, AMYLASE in the last 168 hours. No results for input(s): AMMONIA in the last 168 hours. CBC:  Recent Labs Lab 06/07/14 0522 06/08/14 0542 06/09/14 0556 06/10/14 0508 06/11/14 0452  WBC 20.5* 19.6* 16.6* 15.1* 13.7*  NEUTROABS   --   --  12.4*  --   --   HGB 10.3* 9.8* 9.5* 9.3* 8.9*  HCT 31.6* 29.8* 28.6* 28.3* 27.0*  MCV 84.3 83.9 84.4 84.0 84.4  PLT 207 218 223 220 211   Cardiac Enzymes: No results for input(s): CKTOTAL, CKMB, CKMBINDEX, TROPONINI in the last 168 hours. BNP: Invalid input(s): POCBNP CBG:  Recent Labs Lab 06/10/14 1645 06/10/14 2032 06/11/14 0132 06/11/14 0407 06/11/14 0754  GLUCAP 157* 108* 135* 118* 119*    Recent Results (from the past 240 hour(s))  Surgical pcr screen     Status: None   Collection Time: 06/02/14  8:54 PM  Result Value Ref Range Status   MRSA, PCR NEGATIVE NEGATIVE Final   Staphylococcus aureus NEGATIVE NEGATIVE Final    Comment:        The Xpert SA Assay (FDA approved for NASAL specimens in patients over 39 years of age), is one component of a comprehensive surveillance program.  Test performance has been validated by [facility] for patients greater than or equal to 48 year old. It is not intended to diagnose infection nor to guide or monitor treatment.   Culture, blood (routine x 2)     Status: None (Preliminary result)   Collection Time: 06/07/14  9:50 AM  Result Value Ref Range Status   Specimen Description BLOOD  LEFT WRIST  Final   Special Requests BOTTLES DRAWN AEROBIC ONLY  8CC  Final   Culture   Final           BLOOD CULTURE RECEIVED NO GROWTH TO DATE CULTURE WILL BE HELD FOR 5 DAYS BEFORE ISSUING A FINAL NEGATIVE REPORT Performed at [facility]    Report Status PENDING  Incomplete  Culture, blood (routine x 2)     Status: None (Preliminary result)   Collection Time: 06/07/14  9:55 AM  Result Value Ref Range Status   Specimen Description BLOOD LEFT HAND  Final   Special Requests BOTTLES DRAWN AEROBIC ONLY 10CC  Final   Culture   Final           BLOOD CULTURE RECEIVED NO GROWTH TO DATE CULTURE WILL BE HELD FOR 5 DAYS BEFORE ISSUING A FINAL NEGATIVE REPORT Performed at [facility]    Report Status PENDING  Incomplete   Clostridium Difficile by PCR     Status: None   Collection Time: 06/07/14  1:21 PM  Result Value Ref Range Status   C difficile by pcr NEGATIVE NEGATIVE Final     Scheduled Meds: . antiseptic oral rinse  7 mL Mouth Rinse q12n4p  . chlorhexidine  15 mL Mouth Rinse BID  . insulin aspart  0-20 Units Subcutaneous 6 times per day  . levothyroxine  12.5 mcg Intravenous Daily  .  metoprolol  5 mg Intravenous 4 times per day  . piperacillin-tazobactam (ZOSYN)  IV  3.375 g Intravenous 3 times per day  . Warfarin - Pharmacist Dosing Inpatient   Does not apply q1800   Continuous Infusions: . Marland KitchenTPN (CLINIMIX-E) Adult 40 mL/hr at 06/10/14 1657   And  . fat emulsion 240 mL (06/10/14 1658)  . heparin 1,500 Units/hr (06/10/14 2335)  . lactated ringers 10 mL/hr at 06/03/14 1015     Deseri Loss, DO  [facility] Pager [phone]  If 7PM-7AM, please contact night-coverage [email or web address] Password [email or web address] 06/11/2014, 8:12 AM   LOS: 13 days

## 2014-06-11 NOTE — Progress Notes (Signed)
PARENTERAL NUTRITION CONSULT NOTE - FOLLOW UP  Pharmacy Consult:  TPN Indication:  Colonic stricture  Allergies  Allergen Reactions  . Augmentin [Amoxicillin-Pot Clavulanate] Other (See Comments)    Severe vaginal itching  . Tranxene [Clorazepate] Itching    Patient Measurements: Height: '5\' 7"'  (170.2 cm) Weight: 216 lb 14.9 oz (98.4 kg) IBW/kg (Calculated) : 61.6  Vital Signs: Temp: 99 F (37.2 C) (06/08 0609) Temp Source: Oral (06/08 0609) BP: 119/45 mmHg (06/08 0609) Pulse Rate: 98 (06/08 0609) Intake/Output from previous day: 06/07 0701 - 06/08 0700 In: 1490 [P.O.:660; I.V.:180; IV Piggyback:50; TPN:600] Out: 550 [Stool:550]  Labs:  Recent Labs  06/09/14 0556 06/09/14 0557 06/10/14 0508 06/11/14 0452  WBC 16.6*  --  15.1* 13.7*  HGB 9.5*  --  9.3* 8.9*  HCT 28.6*  --  28.3* 27.0*  PLT 223  --  220 211  INR  --  1.22 1.27 1.23     Recent Labs  06/09/14 0556 06/10/14 0508 06/11/14 0452  NA 134* 130* 130*  K 4.1 4.0 3.9  CL 100* 99* 100*  CO2 '26 26 25  ' GLUCOSE 73 114* 116*  BUN 23* 24* 20  CREATININE 0.86 0.97 0.91  CALCIUM 8.3* 8.1* 8.1*  MG 1.6* 1.9  --   PHOS 3.9  --   --   PROT 5.4*  --   --   ALBUMIN 1.8*  --   --   AST 22  --   --   ALT 22  --   --   ALKPHOS 70  --   --   BILITOT 0.5  --   --   PREALBUMIN 7.2*  --   --   TRIG 61  --   --    Estimated Creatinine Clearance: 66.3 mL/min (by C-G formula based on Cr of 0.91).    Recent Labs  06/11/14 0132 06/11/14 0407 06/11/14 0754  GLUCAP 135* 118* 119*     Insulin Requirements in the past 24 hours: 7units moderate SSI + 75 units regular insulin in TPN  Assessment: 74 YOF found to have a colonic stricture with obstruction on colonoscopy 05/30/14.  S/p sigmoid colectomy and colostomy on 06/03/14.  TPN d/c'd by surgery on 6/8  Surgeries/Procedures:      *5/27: colonoscopy showed colonic stricture found    *5/31: partial colectomy and colostomy GI: Prealbumin 11.8 >> 13>> decreased  7.2. Wt trending down. CT 6/4 shows post-op ileus. NG removed 6/2 - may need NGT per surg if sxs persist but no further N/V. Clear liquid starting 6/6 with improving ileus >> advanced to soft diet 6/7 and weaning TPN. Pt is eating 50-75% of meals Endo: hypothyroid on IV Synthroid.  DM - CBGs controlled of goal 108-157 on decreased ins in TPN bag + SSI Lytes: Na low stable 130, low CL 100, Mg low 1.9 and given Mg 2g on 6/7 (goal >=2 with ileus), K low 3.9 (goal >/=4) Renal: SCr stable at 0.91, CrCL 66. I/Os inaccurate. LR'@10'  Cards: HTN / HLD - BP soft-wnl. HR slightly tachy-wnl. IV Lopressor Neuro: hydromorphone prn Hepatobil: LFTs wnl, alk phos, tbili remain wnl, TG WNL ID: [email or web address] D#[id number] for presumed surgical wound infxn. Low-grade fevers yesterday, now afebrile, WBC down to 13.7. CT - no signs of intra-abdominal infxn. BCx2 >> ngtd. Cdiff neg Anticoag: hx DVT/PE s/p IVC filter, Coumadin PTA >> heparin gtt + warfarin. INR 1.23 (stable) Best Practices: heparin gtt, MC TPN Access: PICC line placed 05/30/14 TPN start date: 05/30/14  Current Nutrition:  Soft diet  Nutritional Goals:  1850-2050 kCal, 95-100g protein per day   Plan:  - Discontinue TPN per surgery. Decrease TPN to 20 ml/h and d/c at 1800 - Continue patient's current diet - Transition synthroid to po per protocol - Transition thiamine and folate to po (previously added to TPN bag) - Change SSI to tid with meals  Elicia Lamp, PharmD Clinical Pharmacist - Resident Pager [phone] 06/11/2014 8:44 AM

## 2014-06-12 DIAGNOSIS — D72829 Elevated white blood cell count, unspecified: Secondary | ICD-10-CM

## 2014-06-12 DIAGNOSIS — E860 Dehydration: Secondary | ICD-10-CM

## 2014-06-12 LAB — BASIC METABOLIC PANEL
Anion gap: 8 (ref 5–15)
BUN: 14 mg/dL (ref 6–20)
CALCIUM: 8.2 mg/dL — AB (ref 8.9–10.3)
CO2: 23 mmol/L (ref 22–32)
CREATININE: 0.91 mg/dL (ref 0.44–1.00)
Chloride: 102 mmol/L (ref 101–111)
GFR calc Af Amer: >60 mL/min (ref >60)
GFR calc non Af Amer: >60 mL/min (ref >60)
GLUCOSE: 109 mg/dL — AB (ref 65–99)
Potassium: 4.2 mmol/L (ref 3.5–5.1)
SODIUM: 133 mmol/L — AB (ref 135–145)

## 2014-06-12 LAB — CBC WITH DIFFERENTIAL/PLATELET
BASOS ABS: 0 10*3/uL (ref 0.0–0.1)
BASOS PCT: 0 % (ref 0–1)
Eosinophils Absolute: 0.3 10*3/uL (ref 0.0–0.7)
Eosinophils Relative: 2 % (ref 0–5)
HCT: 28.3 % — ABNORMAL LOW (ref 36.0–46.0)
Hemoglobin: 9.4 g/dL — ABNORMAL LOW (ref 12.0–15.0)
LYMPHS ABS: 2.3 10*3/uL (ref 0.7–4.0)
Lymphocytes Relative: 15 % (ref 12–46)
MCH: 27.9 pg (ref 26.0–34.0)
MCHC: 33.2 g/dL (ref 30.0–36.0)
MCV: 84 fL (ref 78.0–100.0)
Monocytes Absolute: 1.1 10*3/uL — ABNORMAL HIGH (ref 0.1–1.0)
Monocytes Relative: 7 % (ref 3–12)
Neutro Abs: 11.8 10*3/uL — ABNORMAL HIGH (ref 1.7–7.7)
Neutrophils Relative %: 76 % (ref 43–77)
Platelets: 226 10*3/uL (ref 150–400)
RBC: 3.37 MIL/uL — AB (ref 3.87–5.11)
RDW: 14.8 % (ref 11.5–15.5)
WBC: 15.5 10*3/uL — AB (ref 4.0–10.5)

## 2014-06-12 LAB — PROTIME-INR
INR: 1.26 (ref 0.00–1.49)
Prothrombin Time: 16 seconds — ABNORMAL HIGH (ref 11.6–15.2)

## 2014-06-12 LAB — GLUCOSE, CAPILLARY
GLUCOSE-CAPILLARY: 94 mg/dL (ref 65–99)
GLUCOSE-CAPILLARY: 105 mg/dL — AB (ref 65–99)
Glucose-Capillary: 150 mg/dL — ABNORMAL HIGH (ref 65–99)
Glucose-Capillary: 110 mg/dL — ABNORMAL HIGH (ref 65–99)

## 2014-06-12 LAB — HEPARIN LEVEL (UNFRACTIONATED)
Heparin Unfractionated: 0.76 IU/mL — ABNORMAL HIGH (ref 0.30–0.70)
Heparin Unfractionated: 0.58 IU/mL (ref 0.30–0.70)

## 2014-06-12 MED ORDER — METOPROLOL TARTRATE 25 MG PO TABS
25.0000 mg | ORAL_TABLET | Freq: Two times a day (BID) | ORAL | Status: DC
  Administered 2014-06-13 – 2014-06-16 (×7): 25 mg via ORAL
  Filled 2014-06-12 (×8): qty 1

## 2014-06-12 MED ORDER — WARFARIN SODIUM 7.5 MG PO TABS
7.5000 mg | ORAL_TABLET | Freq: Once | ORAL | Status: AC
  Administered 2014-06-12: 7.5 mg via ORAL
  Filled 2014-06-12: qty 1

## 2014-06-12 NOTE — Progress Notes (Signed)
Utilization Review completed. Kezia Benevides RN BSN CM 

## 2014-06-12 NOTE — Progress Notes (Signed)
PROGRESS NOTE  Hannah [name] GNF:[id number] DOB: 08-29-1940 DOA: 05/29/2014 PCP: [name]., DO  Brief summary 74 yo female w/ history DM, diverticulosis, DVT/PE on chronic Coumadin, HTN, and dyslipidemia. Presented w/ intractable N/V in setting of recent dx colonic stricture. Pt had presented to ER on multiple occasions for abdominal pain that has progressively worsened and has become assoc with N/V. Sx's began one week ago; CT abdomen 5/20 revealed stricture at descending and sigmoid colon. Plan was for pt to check w/ PCP re OK to hold Coumadin for colonoscopy then proceed as OP. On 5/21 INR was 5.49 so she was instructed to hold her Coumadin. Unfortunately her sx's worsened and she came to ER. On colonoscopy, she was found to have stricture so surgery was consulted.She underwent exploratory laparotomy with partial colectomy and colostomy on May 31. Subsequently, patient started developing low-grade fever and her WBC started increasing. CT abdomen was unremarkable for infection. She does have some evidence for infection over her surgical wound. All other infectious workup was negative. She was started on Zosyn 06/07/2014. Now she is tolerating a soft diet  Assessment/Plan: Sigmoid colon stricture/postoperative ileus She is status post partial colectomy and colostomy. General surgery is following. Initially seen by GI and underwent flexible sigmoidoscopy which showed sigmoid colon stricture. Subsequently Surgery was consulted and the patient underwent surgery. General surgery is following and managing. CT scan done postoperatively. Did suggest ileus. However, patient has done well.  Diet to be advanced by general surgery. -wean TPN per surgery -continue soft diet  Dehydration/Acute renal failure/hyponatremia Sodium level is improved/stable with infusion of normal saline/TPN.  -Creatinine has improved.  -wean TPN per surgery  Low Grade Fever/Leukocytosis/Possible wound  infection -Patient was started on Zosyn on 6/4 for possible surgical wound infection. . -Suspect low-grade fevers may be due to continued atelectasis -Encouraged and incentive spirometry -Increase activity -no evidence for any infection at this time based on extensive testing including chest x-ray, UA, blood cultures, lactic acid, CT of the abdomen.  -UA is unremarkable for infection.  -Chest x-ray shows only atelectasis. CT scan did not show any intra-abdominal infection. -06/07/14--Blood cultures are negative so far. Lactic acid is normal.  -06/07/14 Stool for C. difficile was negative.  -The patient is afebrile hemodynamically stable--discontinue Zosyn and monitor off antibiotics -06/12/14--repeat Cdiff pcr  Sinus tachycardia -Secondary to acute medical condition -Overall improved -Patient is asymptomatic History of pulmonary embolism on anticoagulation with warfarin She was diagnosed with a submassive pulmonary embolism in July 2014. She actually had a PEA arrest and required thrombolytics. -06/10/2014--warfarin restarted -Continue heparin bridge -Patient has IVC filter -The patient is ready for discharge, she will be converted to Lovenox bridge Hypothyroidism -Changed IV to by mouth levothyroxine  History of essential Hypertension PRN hydralazine and intravenous beta blocker. Change over to oral tomorrow if she tolerates her diet.  Diabetes mellitus with neurological manifestations, controlled -change SSI to q ac and hs -04/09/2014 hemoglobin A1c 7.3 -Hold metformin and glipizide  Hypokalemia Repleted. Continue to monitor.  Normocytic anemia Some drop noted in Hemoglobin. No overt bleeding. Continue to monitor.  DVT Prophylaxis: On IV heparin  Code Status: Full code  Family Communication: Discussed with daughter at bedside  Disposition Plan:SNF in 2-3 days when cleared by surgery   Procedures/Studies: Ct Abdomen Pelvis W Contrast  06/07/2014   CLINICAL DATA:   Generalized abdominal pain. Worsening leukocytosis. Postop from partial colectomy with colostomy.  EXAM: CT ABDOMEN AND  PELVIS WITH CONTRAST  TECHNIQUE: Multidetector CT imaging of the abdomen and pelvis was performed using the standard protocol following bolus administration of intravenous contrast.  CONTRAST:  OMNIPAQUE IOHEXOL 300 MG/ML  SOLN  COMPARISON:  05/23/2014  FINDINGS: Lower Chest: Increased bilateral lower lobe subsegmental atelectasis since prior exam.  Hepatobiliary: No masses or other significant abnormality identified. Gallbladder is unremarkable.  Pancreas: No mass, inflammatory changes, or other significant abnormality identified.  Spleen:  Within normal limits in size and appearance.  Adrenals:  No masses identified.  Kidneys/Urinary Tract:  No evidence of masses or hydronephrosis.  Stomach/Bowel/Peritoneum: Postop changes are seen from sigmoid colon resection and left lower quadrant colostomy. Decreased colonic dilatation is seen since previous study. Mild dilatation of small bowel loops is seen without evidence of focal transition point, likely due to postop ileus. No evidence of abscess or free fluid.  Vascular/Lymphatic: No pathologically enlarged lymph nodes identified. IVC filter remains in place. No other significant abnormality visualized.  Reproductive: Prior hysterectomy noted. Adnexal regions are unremarkable in appearance.  Other: Left hip prosthesis results in beam hardening artifact through the inferior aspect of the pelvis.  Musculoskeletal:  No suspicious bone lesions identified.  IMPRESSION: Expected postop changes from sigmoid colon resection with left lower quadrant colostomy. Decreased colonic dilatation, with mild postop ileus pattern.  No evidence of abscess or free fluid.  Increased bibasilar atelectasis.   Electronically Signed   By: Myles Rosenthal M.D.   On: 06/07/2014 20:36   Dg Chest Port 1 View  06/06/2014   CLINICAL DATA:  Fever, history of DVT, pulmonary  embolism, hypertension, diabetes  EXAM: PORTABLE CHEST - 1 VIEW  COMPARISON:  Portable exam 0855 hours compared 08/29/2012  FINDINGS: RIGHT arm PICC line tip projects over cavoatrial junction.  Enlargement of cardiac silhouette with pulmonary vascular congestion.  Atherosclerotic calcification aorta.  Chronic elevation RIGHT diaphragm.  Decreased lung volumes with RIGHT basilar atelectasis.  No definite infiltrate, pleural effusion or pneumothorax.  IMPRESSION: Decreased lung volumes with RIGHT basilar atelectasis.  Enlargement of cardiac silhouette with pulmonary vascular congestion.   Electronically Signed   By: Ulyses Southward M.D.   On: 06/06/2014 09:07   Dg Abd 2 Views  05/27/2014   CLINICAL DATA:  Generalized abdominal pain and vomiting for 5 days.  EXAM: ABDOMEN - 2 VIEW  COMPARISON:  CT 05/23/2014  FINDINGS: There is gaseous distention of the colon. No small bowel dilatation to suggest small bowel obstruction. No free air, organomegaly or suspicious calcification. IVC filter in place. Prior left hip replacement.  IMPRESSION: Mild gaseous distention of the colon, possibly mild ileus.   Electronically Signed   By: Charlett Nose M.D.   On: 05/27/2014 16:07   Ct Cta Abd/pel W/cm &/or W/o Cm  05/23/2014   CLINICAL DATA:  Diffuse abdominal pain.  EXAM: CTA ABDOMEN AND PELVIS WITH CONTRAST  TECHNIQUE: Multidetector CT imaging of the abdomen and pelvis was performed using the standard protocol during bolus administration of intravenous contrast. Multiplanar reconstructed images and MIPs were obtained and reviewed to evaluate the vascular anatomy.  CONTRAST:  80mL OMNIPAQUE IOHEXOL 350 MG/ML SOLN  COMPARISON:  CT scan dated 07/15/2012  FINDINGS: There is minimal calcification in the abdominal aorta and iliac arteries. Superior mesenteric, inferior mesenteric and celiac arteries are widely patent. There are 2 widely patent artery to each each kidney.  The superior mesenteric vein and portal vein are normal.  There is  a small amount of ascites anterior to the right lobe  of the liver just below the diaphragm. Liver is otherwise normal. Biliary tree, spleen, pancreas, adrenal glands, and kidneys demonstrate no significant abnormality. There is slight hyperplasia of both adrenal glands, unchanged.  There is diffuse distention of the colon to the proximal sigmoid region. The colon is filled with air and fluid. No dilated small bowel. Terminal ileum and appendix are normal.  There is a transition point at the junction of the descending colon with the sigmoid. There is a small amount of free fluid in the left pericolic gutter adjacent to this transition point and also small amount of free fluid in the pelvis. There are multiple diverticula in the sigmoid colon below the transition point.  The patient has had diverticulitis in this area in the past. This may represent a diverticular stricture although I cannot exclude a mucosal mass at this transition point.  Filter in the IVC.  No acute osseous abnormalities.  Rectum appears normal. Bladder is normal. Uterus appears to have been removed.  Review of the MIP images confirms the above findings.  IMPRESSION: 1. No significant vascular abnormality of the abdomen. No arterial stenoses. Widely patent SMA and SMV. 2. Diffuse dilatation of the fluid-filled colon to the junction of the descending and sigmoid portions. The possibility of a stricture secondary to prior diverticulitis or mucosal mass should be considered. 3. Small amount of ascites in the abdomen.   Electronically Signed   By: Francene Boyers M.D.   On: 05/23/2014 13:04         Subjective: Patient sat up in a chair today. She is tolerating small amounts of soft food. Denies any fevers, chills, chest discomfort, short of breath, vomiting, worsening abdominal pain. Abdominal pain is controlled. Denies any dysuria.   Objective: Filed Vitals:   06/12/14 0626 06/12/14 1039 06/12/14 1241 06/12/14 1401  BP: 121/53 148/66  137/76 122/77  Pulse: 92 93 93 88  Temp: 98.4 F (36.9 C) 98.6 F (37 C)  99 F (37.2 C)  TempSrc: Oral Oral  Oral  Resp: Height:      Weight:      SpO2: 100% 100%  100%    Intake/Output Summary (Last 24 hours) at 06/12/14 1555 Last data filed at 06/12/14 1417  Gross per 24 hour  Intake    413 ml  Output    254 ml  Net    159 ml   Weight change:  Exam:   General:  Pt is alert, follows commands appropriately, not in acute distress  HEENT: No icterus, No thrush, [place]/AT  Cardiovascular: RRR, S1/S2, no rubs, no gallops  Respiratory: Bibasilar rales. No wheezes. Good air movement   Abdomen: Soft/+BS, non tender, non distended, no guarding  Extremities: trace LE edema, No lymphangitis, No petechiae, No rashes, no synovitis  Data Reviewed: Basic Metabolic Panel:  Recent Labs Lab 06/06/14 0617 06/07/14 0522 06/08/14 0542 06/09/14 0556 06/10/14 0508 06/11/14 0452 06/12/14 0522  NA 128* 132* 132* 134* 130* 130* 133*  K 4.3 4.1 4.2 4.1 4.0 3.9 4.2  CL 97* 97* 99* 100* 99* 100* 102  CO2 GLUCOSE 121* 128* 92 73 114* 116* 109*  BUN 52* 37* 30* 23* 24* 20 14  CREATININE 0.99 0.85 0.86 0.86 0.97 0.91 0.91  CALCIUM 8.2* 8.4* 8.4* 8.3* 8.1* 8.1* 8.2*  MG 1.6* 1.8  --  1.6* 1.9  --   --   PHOS 2.8 3.2  --  3.9  --   --   --  Liver Function Tests:  Recent Labs Lab 06/09/14 0556  AST 22  ALT 22  ALKPHOS 70  BILITOT 0.5  PROT 5.4*  ALBUMIN 1.8*   No results for input(s): LIPASE, AMYLASE in the last 168 hours. No results for input(s): AMMONIA in the last 168 hours. CBC:  Recent Labs Lab 06/08/14 0542 06/09/14 0556 06/10/14 0508 06/11/14 0452 06/12/14 0522  WBC 19.6* 16.6* 15.1* 13.7* 15.5*  NEUTROABS  --  12.4*  --   --  11.8*  HGB 9.8* 9.5* 9.3* 8.9* 9.4*  HCT 29.8* 28.6* 28.3* 27.0* 28.3*  MCV 83.9 84.4 84.0 84.4 84.0  PLT 218 223 220 211 226   Cardiac Enzymes: No results for input(s): CKTOTAL, CKMB, CKMBINDEX,  TROPONINI in the last 168 hours. BNP: Invalid input(s): POCBNP CBG:  Recent Labs Lab 06/11/14 1229 06/11/14 1645 06/11/14 2222 06/12/14 0753 06/12/14 1219  GLUCAP 124* 124* 114* 94 150*    Recent Results (from the past 240 hour(s))  Surgical pcr screen     Status: None   Collection Time: 06/02/14  8:54 PM  Result Value Ref Range Status   MRSA, PCR NEGATIVE NEGATIVE Final   Staphylococcus aureus NEGATIVE NEGATIVE Final    Comment:        The Xpert SA Assay (FDA approved for NASAL specimens in patients over 5 years of age), is one component of a comprehensive surveillance program.  Test performance has been validated by [facility] for patients greater than or equal to 33 year old. It is not intended to diagnose infection nor to guide or monitor treatment.   Culture, blood (routine x 2)     Status: None (Preliminary result)   Collection Time: 06/07/14  9:50 AM  Result Value Ref Range Status   Specimen Description BLOOD  LEFT WRIST  Final   Special Requests BOTTLES DRAWN AEROBIC ONLY  8CC  Final   Culture   Final           BLOOD CULTURE RECEIVED NO GROWTH TO DATE CULTURE WILL BE HELD FOR 5 DAYS BEFORE ISSUING A FINAL NEGATIVE REPORT Performed at [facility]    Report Status PENDING  Incomplete  Culture, blood (routine x 2)     Status: None (Preliminary result)   Collection Time: 06/07/14  9:55 AM  Result Value Ref Range Status   Specimen Description BLOOD LEFT HAND  Final   Special Requests BOTTLES DRAWN AEROBIC ONLY 10CC  Final   Culture   Final           BLOOD CULTURE RECEIVED NO GROWTH TO DATE CULTURE WILL BE HELD FOR 5 DAYS BEFORE ISSUING A FINAL NEGATIVE REPORT Performed at [facility]    Report Status PENDING  Incomplete  Clostridium Difficile by PCR     Status: None   Collection Time: 06/07/14  1:21 PM  Result Value Ref Range Status   C difficile by pcr NEGATIVE NEGATIVE Final     Scheduled Meds: . antiseptic oral rinse  7 mL Mouth  Rinse q12n4p  . chlorhexidine  15 mL Mouth Rinse BID  . feeding supplement (ENSURE ENLIVE)  237 mL Oral Q24H  . folic acid  1 mg Oral Daily  . insulin aspart  0-9 Units Subcutaneous TID WC  . levothyroxine  25 mcg Oral QAC breakfast  . metoprolol  5 mg Intravenous 4 times per day  . piperacillin-tazobactam (ZOSYN)  IV  3.375 g Intravenous 3 times per day  . thiamine  100 mg Oral  Daily  . warfarin  7.5 mg Oral ONCE-1800  . Warfarin - Pharmacist Dosing Inpatient   Does not apply q1800   Continuous Infusions: . heparin 1,400 Units/hr (06/12/14 1035)  . lactated ringers 10 mL/hr at 06/03/14 1015     Davy Westmoreland, DO  [facility] Pager [phone]  If 7PM-7AM, please contact night-coverage [email or web address] Password [email or web address] 06/12/2014, 3:55 PM   LOS: 14 days

## 2014-06-12 NOTE — Progress Notes (Addendum)
9 Days Post-Op  Subjective: Emesis is documented in computer but pt denies any emesis; had some nausea. Didn't get out of bed yesterday. Pain ok.   Objective: Vital signs in last 24 hours: Temp:  [98.4 F (36.9 C)-98.9 F (37.2 C)] 98.4 F (36.9 C) (06/09 0626) Pulse Rate:  [92-103] 92 (06/09 0626) Resp:  [16-18] 18 (06/09 0626) BP: (121-152)/(53-68) 121/53 mmHg (06/09 0626) SpO2:  [96 %-100 %] 100 % (06/09 0626) Last BM Date: 06/11/14  Intake/Output from previous day: 06/08 0701 - 06/09 0700 In: 390 [P.O.:240; IV Piggyback:150] Out: 254 [Urine:2; Emesis/NG output:2; Stool:250] Intake/Output this shift:    Alert, nontoxic IS about 750 Obese, soft, open midline wound - granulation tissue; ostomy- viable, air/stool in bag  Lab Results:   Recent Labs  06/11/14 0452 06/12/14 0522  WBC 13.7* 15.5*  HGB 8.9* 9.4*  HCT 27.0* 28.3*  PLT 211 226   BMET  Recent Labs  06/11/14 0452 06/12/14 0522  NA 130* 133*  K 3.9 4.2  CL 100* 102  CO2 25 23  GLUCOSE 116* 109*  BUN 20 14  CREATININE 0.91 0.91  CALCIUM 8.1* 8.2*   PT/INR  Recent Labs  06/10/14 0508 06/11/14 0452  LABPROT 16.1* 15.7*  INR 1.27 1.23   ABG No results for input(s): PHART, HCO3 in the last 72 hours.  Invalid input(s): PCO2, PO2  Studies/Results: No results found.  Anti-infectives: Anti-infectives    Start     Dose/Rate Route Frequency Ordered Stop   06/07/14 1000  piperacillin-tazobactam (ZOSYN) IVPB 3.375 g     3.375 g 12.5 mL/hr over 240 Minutes Intravenous 3 times per day 06/07/14 0930     06/03/14 1615  gentamicin (GARAMYCIN) IVPB 100 mg  Status:  Discontinued    Comments:  Please make hang time appropriate with when pre-op abx was given   100 mg 200 mL/hr over 30 Minutes Intravenous  Once 06/03/14 1611 06/03/14 1616   06/03/14 1615  clindamycin (CLEOCIN) IVPB 900 mg  Status:  Discontinued     900 mg 100 mL/hr over 30 Minutes Intravenous  Once 06/03/14 1611 06/03/14 1617   06/03/14 0900  gentamicin (GARAMYCIN) IVPB 100 mg     100 mg 200 mL/hr over 30 Minutes Intravenous To [facility] 06/02/14 1111 06/03/14 1130   06/03/14 0900  clindamycin (CLEOCIN) IVPB 900 mg     900 mg 100 mL/hr over 30 Minutes Intravenous To ShortStay Surgical 06/02/14 1208 06/03/14 1130   06/02/14 1115  clindamycin (CLEOCIN) IVPB 900 mg  Status:  Discontinued     900 mg 100 mL/hr over 30 Minutes Intravenous On call to O.R. 06/02/14 1111 06/02/14 1206      Assessment/Plan: s/p Procedure(s): PARTIAL COLECTOMY (N/A) COLOSTOMY (N/A) Benign colonic stricture POD 9 s/p ex lap with partial colectomy and colostomy --> Dr. Luisa Hart -cont PT -pt NEEDS TO MOBILIZE - spoke with nurse -WOC following patient for new colostomy -wound is clean, BID Wet to dry dressing changes Leukocytosis -CT of abdomen 6/4 without surgical complications to explain leukocytosis. WBC is stable, but no temp for 24hrs. On Zosyn empirically. do not believe wbc is coming from abd/wound. Would rec stopping abx and monitoring temp curve/wbc DVT prophylaxis -heparin drip (h/o PE)/SCDs, coumadin FEN-DC TPN, continue with current diet, protein shakes Dispo-i think can move toward placement/dispo  Mary Sella. Andrey Campanile, MD, FACS General, Bariatric, & Minimally Invasive Surgery [facility], [place]    LOS: 14 days    Atilano Ina 06/12/2014

## 2014-06-12 NOTE — Progress Notes (Signed)
Physical Therapy Treatment Patient Details Name: Hannah [name] MRN: [id number] DOB: 08/02/1940 Today's Date: 2014-06-16    History of Present Illness Pt is a 74 year old female with a PMH of DVT s/p IVC filter, HTN, DM, and diverticulitis admitted for abdominal pain, nausea, vomiting, and sigmoid colon stricture. Pt underwent exploratory lap with colectomy and colostomy on 06/03/14    PT Comments    Pt making slow but steady progress.  Follow Up Recommendations  SNF     Equipment Recommendations  None recommended by PT    Recommendations for Other Services       Precautions / Restrictions Precautions Precautions: Fall Precaution Comments: colostomy Restrictions Weight Bearing Restrictions: No    Mobility  Bed Mobility               General bed mobility comments: Pt in recliner  Transfers Overall transfer level: Needs assistance Equipment used: Rolling walker (2 wheeled) Transfers: Sit to/from Stand Sit to Stand: Mod assist Stand pivot transfers: Mod assist       General transfer comment: Verbal cues to scoot to edge of chair and bring feet back prior to stand. Assist to bring hips up.  Ambulation/Gait Ambulation/Gait assistance: Min assist Ambulation Distance (Feet): 40 Feet Assistive device: Rolling walker (2 wheeled) Gait Pattern/deviations: Step-through pattern;Decreased step length - right;Decreased step length - left;Trunk flexed Gait velocity: slow Gait velocity interpretation: Below normal speed for age/gender General Gait Details: Verbal cues to stand more erect and take longer steps.   Stairs            Wheelchair Mobility    Modified Rankin (Stroke Patients Only)       Balance   Sitting-balance support: Bilateral upper extremity supported Sitting balance-Leahy Scale: Good     Standing balance support: Bilateral upper extremity supported Standing balance-Leahy Scale: Poor Standing balance comment: walker and min A                     Cognition Arousal/Alertness: Awake/alert Behavior During Therapy: WFL for tasks assessed/performed Overall Cognitive Status: Within Functional Limits for tasks assessed                      Exercises      General Comments        Pertinent Vitals/Pain Pain Assessment: Faces Faces Pain Scale: Hurts little more Pain Location: abdomen Pain Descriptors / Indicators: Grimacing Pain Intervention(s): Limited activity within patient's tolerance;Repositioned    Home Living                      Prior Function            PT Goals (current goals can now be found in the care plan section) Progress towards PT goals: Progressing toward goals    Frequency  Min 2X/week    PT Plan Current plan remains appropriate;Discharge plan needs to be updated    Co-evaluation             End of Session Equipment Utilized During Treatment: Gait belt Activity Tolerance: Patient tolerated treatment well Patient left: in chair;with call bell/phone within reach;with chair alarm set     Time: 4497-5300 PT Time Calculation (min) (ACUTE ONLY): 20 min  Charges:  $Gait Training: 8-22 mins                    G Codes:      Hannah [name] 06/16/14, 2:41 PM  [employer] PT  [phone]   

## 2014-06-12 NOTE — Clinical Social Work Note (Signed)
Clinical Social Worker continuing to follow patient and family for support and discharge planning needs.  Patient son has notified CSW that patient and family have decided upon [facility] for placement.  CSW contacted facility who states that they will be able to provide patient with a bed once medically stable.  CSW updated patient family over the phone.  CSW remains available for support and to facilitate patient discharge needs once medically stable.  Macario Golds, [place] [phone]

## 2014-06-12 NOTE — Progress Notes (Signed)
ANTICOAGULATION CONSULT NOTE - Follow Up Consult  Pharmacy Consult for heparin Indication: DVT   Labs:  Recent Labs  06/10/14 0508 06/11/14 0452 06/12/14 0522 06/12/14 0527  HGB 9.3* 8.9* 9.4*  --   HCT 28.3* 27.0* 28.3*  --   PLT 220 211 226  --   LABPROT 16.1* 15.7*  --   --   INR 1.27 1.23  --   --   HEPARINUNFRC 0.58 0.66  --  0.76*  CREATININE 0.97 0.91 0.91  --       Assessment: 74yo female now above goal on heparin after three levels at goal though had been trending up.  Goal of Therapy:  Heparin level 0.3-0.7 units/ml   Plan:  Will decrease heparin gtt to 1400 units/hr and check level in 8hr.  Vernard Gambles, PharmD, BCPS  06/12/2014,6:17 AM

## 2014-06-12 NOTE — Progress Notes (Addendum)
ANTICOAGULATION CONSULT NOTE - Follow Up Consult  Pharmacy Consult for heparin and warfarin Indication: DVT  Allergies  Allergen Reactions  . Augmentin [Amoxicillin-Pot Clavulanate] Other (See Comments)    Severe vaginal itching  . Tranxene [Clorazepate] Itching    Patient Measurements: Height: 5\' 7"  (170.2 cm) Weight: 216 lb 14.9 oz (98.4 kg) IBW/kg (Calculated) : 61.6 Heparin Dosing Weight:   Vital Signs: Temp: 99 F (37.2 C) (06/09 1401) Temp Source: Oral (06/09 1401) BP: 122/77 mmHg (06/09 1401) Pulse Rate: 88 (06/09 1401)  Labs:  Recent Labs  06/10/14 0508 06/11/14 0452 06/12/14 0522 06/12/14 0527 06/12/14 0943 06/12/14 1400  HGB 9.3* 8.9* 9.4*  --   --   --   HCT 28.3* 27.0* 28.3*  --   --   --   PLT 220 211 226  --   --   --   LABPROT 16.1* 15.7*  --   --  16.0*  --   INR 1.27 1.23  --   --  1.26  --   HEPARINUNFRC 0.58 0.66  --  0.76*  --  0.58  CREATININE 0.97 0.91 0.91  --   --   --     Estimated Creatinine Clearance: 66.3 mL/min (by C-G formula based on Cr of 0.91).   Medications:  Scheduled:  . antiseptic oral rinse  7 mL Mouth Rinse q12n4p  . chlorhexidine  15 mL Mouth Rinse BID  . feeding supplement (ENSURE ENLIVE)  237 mL Oral Q24H  . folic acid  1 mg Oral Daily  . insulin aspart  0-9 Units Subcutaneous TID WC  . levothyroxine  25 mcg Oral QAC breakfast  . metoprolol  5 mg Intravenous 4 times per day  . [START ON 06/13/2014] metoprolol tartrate  25 mg Oral BID  . thiamine  100 mg Oral Daily  . warfarin  7.5 mg Oral ONCE-1800  . Warfarin - Pharmacist Dosing Inpatient   Does not apply q1800   Infusions:  . heparin 1,400 Units/hr (06/12/14 1035)  . lactated ringers 10 mL/hr at 06/03/14 1015    Assessment: 74 yo female with hx of DVT & PE is currently on therapeutic heparin at a rate of 1400 units/hr. INR today is 1.26  S/P exp lap with partial colectomy/colostomy 6/1. Hgb 8.9 and Plt 211K (pod#7). No bleeding noted.  Hg slight  drop.  No change in INR after 2 doses of warfarin  Goal of Therapy:  Heparin level 0.3-0.7 units/ml  INR goal of 2 - 3 Monitor platelets by anticoagulation protocol: Yes   Plan: Warfarin 7.5 mg x 1 tonight (since no increase of INR after 2 doses) Continue Heparin 1400 units/hr F/U 8 hr HL Daily HL, INR, CBC Monitor s/sx of bleeding  Isaac Bliss, PharmD, BCPS Clinical Pharmacist Pager [phone] 06/12/2014 4:57 PM  Addendum:  Follow up heparin level now at goal (0.58) no IV or bleeding issues noted. Will continue current dose (1400 units/hr) and recheck HL in am. Warfarin already ordered for today.  Sheppard Coil PharmD., BCPS Clinical Pharmacist Pager [phone] 06/12/2014 4:58 PM

## 2014-06-13 LAB — PROTIME-INR
INR: 1.34 (ref 0.00–1.49)
Prothrombin Time: 16.7 seconds — ABNORMAL HIGH (ref 11.6–15.2)

## 2014-06-13 LAB — CBC
HCT: 28 % — ABNORMAL LOW (ref 36.0–46.0)
HEMOGLOBIN: 9.1 g/dL — AB (ref 12.0–15.0)
MCH: 27.3 pg (ref 26.0–34.0)
MCHC: 32.5 g/dL (ref 30.0–36.0)
MCV: 84.1 fL (ref 78.0–100.0)
PLATELETS: 245 10*3/uL (ref 150–400)
RBC: 3.33 MIL/uL — ABNORMAL LOW (ref 3.87–5.11)
RDW: 14.7 % (ref 11.5–15.5)
WBC: 12.5 10*3/uL — ABNORMAL HIGH (ref 4.0–10.5)

## 2014-06-13 LAB — HEPARIN LEVEL (UNFRACTIONATED)
HEPARIN UNFRACTIONATED: 0.77 [IU]/mL — AB (ref 0.30–0.70)
HEPARIN UNFRACTIONATED: 0.79 [IU]/mL — AB (ref 0.30–0.70)
Heparin Unfractionated: 0.76 IU/mL — ABNORMAL HIGH (ref 0.30–0.70)

## 2014-06-13 LAB — GLUCOSE, CAPILLARY
GLUCOSE-CAPILLARY: 88 mg/dL (ref 65–99)
GLUCOSE-CAPILLARY: 130 mg/dL — AB (ref 65–99)
GLUCOSE-CAPILLARY: 110 mg/dL — AB (ref 65–99)
GLUCOSE-CAPILLARY: 96 mg/dL (ref 65–99)

## 2014-06-13 LAB — CULTURE, BLOOD (ROUTINE X 2)
CULTURE: NO GROWTH
Culture: NO GROWTH

## 2014-06-13 LAB — CLOSTRIDIUM DIFFICILE BY PCR: Toxigenic C. Difficile by PCR: NEGATIVE

## 2014-06-13 MED ORDER — GLUCERNA SHAKE PO LIQD
237.0000 mL | Freq: Three times a day (TID) | ORAL | Status: DC
  Administered 2014-06-13 – 2014-06-16 (×6): 237 mL via ORAL

## 2014-06-13 MED ORDER — WARFARIN SODIUM 7.5 MG PO TABS
7.5000 mg | ORAL_TABLET | Freq: Once | ORAL | Status: AC
  Administered 2014-06-13: 7.5 mg via ORAL
  Filled 2014-06-13: qty 1

## 2014-06-13 NOTE — Progress Notes (Signed)
ANTICOAGULATION CONSULT NOTE - Follow Up Consult  Pharmacy Consult for Heparin  Indication: DVT  Allergies  Allergen Reactions  . Augmentin [Amoxicillin-Pot Clavulanate] Other (See Comments)    Severe vaginal itching  . Tranxene [Clorazepate] Itching    Patient Measurements: Height: 5\' 7"  (170.2 cm) Weight: 216 lb 14.9 oz (98.4 kg) IBW/kg (Calculated) : 61.6  Vital Signs: Temp: 98.9 F (37.2 C) (06/10 0532) Temp Source: Oral (06/10 0532) BP: 130/58 mmHg (06/10 0532) Pulse Rate: 98 (06/10 0532)  Labs:  Recent Labs  06/11/14 0452 06/12/14 0522 06/12/14 0527 06/12/14 0943 06/12/14 1400 06/13/14 0456  HGB 8.9* 9.4*  --   --   --  9.1*  HCT 27.0* 28.3*  --   --   --  28.0*  PLT 211 226  --   --   --  245  LABPROT 15.7*  --   --  16.0*  --  16.7*  INR 1.23  --   --  1.26  --  1.34  HEPARINUNFRC 0.66  --  0.76*  --  0.58 0.76*  CREATININE 0.91 0.91  --   --   --   --     Estimated Creatinine Clearance: 66.3 mL/min (by C-G formula based on Cr of 0.91).   Assessment: Supra-therapeutic heparin level, no issues per RN.   Goal of Therapy:  Heparin level 0.3-0.7 units/ml Monitor platelets by anticoagulation protocol: Yes   Plan:  -Decrease heparin to 1300 units/hr -1400 HL -Daily CBC/HL -Monitor for bleeding  Hannah [name] 06/13/2014,6:33 AM

## 2014-06-13 NOTE — Progress Notes (Signed)
PROGRESS NOTE  Hannah [name] ZOX:[id number] DOB: 1940-07-10 DOA: 05/29/2014 PCP: [name]., DO   Brief summary 74 yo female w/ history DM, diverticulosis, DVT/PE on chronic Coumadin, HTN, and dyslipidemia. Presented w/ intractable N/V in setting of recent dx colonic stricture. Pt had presented to ER on multiple occasions for abdominal pain that has progressively worsened and has become assoc with N/V. Sx's began one week ago; CT abdomen 5/20 revealed stricture at descending and sigmoid colon. Plan was for pt to check w/ PCP re OK to hold Coumadin for colonoscopy then proceed as OP. On 5/21 INR was 5.49 so she was instructed to hold her Coumadin. Unfortunately her sx's worsened and she came to ER. On colonoscopy, she was found to have stricture so surgery was consulted.She underwent exploratory laparotomy with partial colectomy and colostomy on May 31. Subsequently, patient started developing low-grade fever and her WBC started increasing. CT abdomen was unremarkable for infection. All other infectious workup was negative. She was started on Zosyn 06/07/2014. After no clear source of infection was found, and the patient remained  hemodynamically stable, her antibiotics were discontinued.  Now she is tolerating a soft diet  Assessment/Plan: Sigmoid colon stricture/postoperative ileus She is status post partial colectomy and colostomy. General surgery is following. Initially seen by GI and underwent flexible sigmoidoscopy which showed sigmoid colon stricture. Subsequently Surgery was consulted and the patient underwent surgery.  -on 06/03/2014--expiratory laparotomy with partial colectomy and colostomy -General surgery is following and managing. CT scan done postoperatively due to fevers and leukocytosis . Did suggested ileus. However, patient has done well. Diet to be advanced by general surgery. -weaned off TPN -continue soft diet--medications changed to oral formulation -Increase  activity -OOB tid  Dehydration/Acute renal failure/hyponatremia/FEN -Sodium level is improved/stable with infusion of normal saline/TPN.  -Creatinine has improved.  -weaned off TPN -Tolerating soft diet although not eating much -06/13/2014--start calorie count  Low Grade Fever/Leukocytosis/Possible wound infection -Patient was started on Zosyn on 6/4 for possible surgical wound infection. . -Suspect low-grade fevers may be due to continued atelectasis -Encouraged and incentive spirometry -Increase activity -no evidence for any infection at this time based on extensive testing including chest x-ray, UA, blood cultures, lactic acid, CT of the abdomen.  -UA is unremarkable for infection.  -Chest x-ray shows only atelectasis. CT scan did not show any intra-abdominal infection. -06/07/14--Blood cultures are negative so far. Lactic acid is normal.  -06/07/14 Stool for C. difficile was negative.  -06/12/14--The patient is afebrile hemodynamically stable--discontinue Zosyn and monitor off antibiotics -06/12/14--repeat Cdiff pcr--neg  Sinus tachycardia -Secondary to acute medical condition -Overall improved -Patient is asymptomatic  History of pulmonary embolism on anticoagulation with warfarin She was diagnosed with a submassive pulmonary embolism in July 2014. She actually had a PEA arrest and required thrombolytics. -06/10/2014--warfarin restarted -Continue heparin bridge -Patient has IVC filter -When  patient is ready for discharge, she will be converted to Lovenox bridge  Hypothyroidism -Changed IV to by mouth levothyroxine  History of essential Hypertension PRN hydralazine and intravenous beta blocker. Change over to oral tomorrow if she tolerates her diet.  Diabetes mellitus with neurological manifestations, controlled -change SSI to q ac and hs -04/09/2014 hemoglobin A1c 7.3 -Hold metformin and glipizide  Hypokalemia Repleted. Continue to monitor.  Normocytic  anemia Some drop noted in Hemoglobin. No overt bleeding. Continue to monitor.  DVT Prophylaxis: On IV heparin  Code Status: Full code  Family Communication: Discussed with daughter  at bedside  Disposition Plan:SNF in 2-3 days when cleared by surgery         Procedures/Studies: Ct Abdomen Pelvis W Contrast  06/07/2014   CLINICAL DATA:  Generalized abdominal pain. Worsening leukocytosis. Postop from partial colectomy with colostomy.  EXAM: CT ABDOMEN AND PELVIS WITH CONTRAST  TECHNIQUE: Multidetector CT imaging of the abdomen and pelvis was performed using the standard protocol following bolus administration of intravenous contrast.  CONTRAST:  OMNIPAQUE IOHEXOL 300 MG/ML  SOLN  COMPARISON:  05/23/2014  FINDINGS: Lower Chest: Increased bilateral lower lobe subsegmental atelectasis since prior exam.  Hepatobiliary: No masses or other significant abnormality identified. Gallbladder is unremarkable.  Pancreas: No mass, inflammatory changes, or other significant abnormality identified.  Spleen:  Within normal limits in size and appearance.  Adrenals:  No masses identified.  Kidneys/Urinary Tract:  No evidence of masses or hydronephrosis.  Stomach/Bowel/Peritoneum: Postop changes are seen from sigmoid colon resection and left lower quadrant colostomy. Decreased colonic dilatation is seen since previous study. Mild dilatation of small bowel loops is seen without evidence of focal transition point, likely due to postop ileus. No evidence of abscess or free fluid.  Vascular/Lymphatic: No pathologically enlarged lymph nodes identified. IVC filter remains in place. No other significant abnormality visualized.  Reproductive: Prior hysterectomy noted. Adnexal regions are unremarkable in appearance.  Other: Left hip prosthesis results in beam hardening artifact through the inferior aspect of the pelvis.  Musculoskeletal:  No suspicious bone lesions identified.  IMPRESSION: Expected postop changes from  sigmoid colon resection with left lower quadrant colostomy. Decreased colonic dilatation, with mild postop ileus pattern.  No evidence of abscess or free fluid.  Increased bibasilar atelectasis.   Electronically Signed   By: Myles Rosenthal M.D.   On: 06/07/2014 20:36   Dg Chest Port 1 View  06/06/2014   CLINICAL DATA:  Fever, history of DVT, pulmonary embolism, hypertension, diabetes  EXAM: PORTABLE CHEST - 1 VIEW  COMPARISON:  Portable exam 0855 hours compared 08/29/2012  FINDINGS: RIGHT arm PICC line tip projects over cavoatrial junction.  Enlargement of cardiac silhouette with pulmonary vascular congestion.  Atherosclerotic calcification aorta.  Chronic elevation RIGHT diaphragm.  Decreased lung volumes with RIGHT basilar atelectasis.  No definite infiltrate, pleural effusion or pneumothorax.  IMPRESSION: Decreased lung volumes with RIGHT basilar atelectasis.  Enlargement of cardiac silhouette with pulmonary vascular congestion.   Electronically Signed   By: Ulyses Southward M.D.   On: 06/06/2014 09:07   Dg Abd 2 Views  05/27/2014   CLINICAL DATA:  Generalized abdominal pain and vomiting for 5 days.  EXAM: ABDOMEN - 2 VIEW  COMPARISON:  CT 05/23/2014  FINDINGS: There is gaseous distention of the colon. No small bowel dilatation to suggest small bowel obstruction. No free air, organomegaly or suspicious calcification. IVC filter in place. Prior left hip replacement.  IMPRESSION: Mild gaseous distention of the colon, possibly mild ileus.   Electronically Signed   By: Charlett Nose M.D.   On: 05/27/2014 16:07   Ct Cta Abd/pel W/cm &/or W/o Cm  05/23/2014   CLINICAL DATA:  Diffuse abdominal pain.  EXAM: CTA ABDOMEN AND PELVIS WITH CONTRAST  TECHNIQUE: Multidetector CT imaging of the abdomen and pelvis was performed using the standard protocol during bolus administration of intravenous contrast. Multiplanar reconstructed images and MIPs were obtained and reviewed to evaluate the vascular anatomy.  CONTRAST:  80mL  OMNIPAQUE IOHEXOL 350 MG/ML SOLN  COMPARISON:  CT scan dated 07/15/2012  FINDINGS: There is minimal calcification in  the abdominal aorta and iliac arteries. Superior mesenteric, inferior mesenteric and celiac arteries are widely patent. There are 2 widely patent artery to each each kidney.  The superior mesenteric vein and portal vein are normal.  There is a small amount of ascites anterior to the right lobe of the liver just below the diaphragm. Liver is otherwise normal. Biliary tree, spleen, pancreas, adrenal glands, and kidneys demonstrate no significant abnormality. There is slight hyperplasia of both adrenal glands, unchanged.  There is diffuse distention of the colon to the proximal sigmoid region. The colon is filled with air and fluid. No dilated small bowel. Terminal ileum and appendix are normal.  There is a transition point at the junction of the descending colon with the sigmoid. There is a small amount of free fluid in the left pericolic gutter adjacent to this transition point and also small amount of free fluid in the pelvis. There are multiple diverticula in the sigmoid colon below the transition point.  The patient has had diverticulitis in this area in the past. This may represent a diverticular stricture although I cannot exclude a mucosal mass at this transition point.  Filter in the IVC.  No acute osseous abnormalities.  Rectum appears normal. Bladder is normal. Uterus appears to have been removed.  Review of the MIP images confirms the above findings.  IMPRESSION: 1. No significant vascular abnormality of the abdomen. No arterial stenoses. Widely patent SMA and SMV. 2. Diffuse dilatation of the fluid-filled colon to the junction of the descending and sigmoid portions. The possibility of a stricture secondary to prior diverticulitis or mucosal mass should be considered. 3. Small amount of ascites in the abdomen.   Electronically Signed   By: Francene Boyers M.D.   On: 05/23/2014 13:04          Subjective:  Patient is sitting up in a chair this afternoon. She denies any fevers, chills, chest discomfort, shortness breath, nausea, vomiting. She is making stool in her ostomy. Denies any abdominal pain. No dysuria or hematuria. Denies any headache or visual disturbance.  Objective: Filed Vitals:   06/12/14 2200 06/13/14 0532 06/13/14 1104 06/13/14 1423  BP: 138/66 130/58 123/59 118/55  Pulse: 84 98 100 94  Temp: 98.6 F (37 C) 98.9 F (37.2 C)  98.8 F (37.1 C)  TempSrc: Oral Oral  Oral  Resp: 16 18  16   Height:      Weight:      SpO2: 100% 97%  98%    Intake/Output Summary (Last 24 hours) at 06/13/14 1539 Last data filed at 06/13/14 1011  Gross per 24 hour  Intake      0 ml  Output   1050 ml  Net  -1050 ml   Weight change:  Exam:   General:  Pt is alert, follows commands appropriately, not in acute distress  HEENT: No icterus, No thrush, No neck mass, [place]/AT  Cardiovascular: RRR, S1/S2, no rubs, no gallops  Respiratory: diminished breath sounds at the bases but clear to auscultation.   Abdomen: Soft/+BS, non tender, non distended, no guarding; loose stool noted an ostomy   Extremities: 1+LE edema, No lymphangitis, No petechiae, No rashes, no synovitis  Data Reviewed: Basic Metabolic Panel:  Recent Labs Lab 06/07/14 0522 06/08/14 0542 06/09/14 0556 06/10/14 0508 06/11/14 0452 06/12/14 0522  NA 132* 132* 134* 130* 130* 133*  K 4.1 4.2 4.1 4.0 3.9 4.2  CL 97* 99* 100* 99* 100* 102  CO2 26 25 26 26 25  23  GLUCOSE 128* 92 73 114* 116* 109*  BUN 37* 30* 23* 24* 20 14  CREATININE 0.85 0.86 0.86 0.97 0.91 0.91  CALCIUM 8.4* 8.4* 8.3* 8.1* 8.1* 8.2*  MG 1.8  --  1.6* 1.9  --   --   PHOS 3.2  --  3.9  --   --   --    Liver Function Tests:  Recent Labs Lab 06/09/14 0556  AST 22  ALT 22  ALKPHOS 70  BILITOT 0.5  PROT 5.4*  ALBUMIN 1.8*   No results for input(s): LIPASE, AMYLASE in the last 168 hours. No results for input(s):  AMMONIA in the last 168 hours. CBC:  Recent Labs Lab 06/09/14 0556 06/10/14 0508 06/11/14 0452 06/12/14 0522 06/13/14 0456  WBC 16.6* 15.1* 13.7* 15.5* 12.5*  NEUTROABS 12.4*  --   --  11.8*  --   HGB 9.5* 9.3* 8.9* 9.4* 9.1*  HCT 28.6* 28.3* 27.0* 28.3* 28.0*  MCV 84.4 84.0 84.4 84.0 84.1  PLT 223 220 211 226 245   Cardiac Enzymes: No results for input(s): CKTOTAL, CKMB, CKMBINDEX, TROPONINI in the last 168 hours. BNP: Invalid input(s): POCBNP CBG:  Recent Labs Lab 06/12/14 1219 06/12/14 1738 06/12/14 2224 06/13/14 0835 06/13/14 1221  GLUCAP 150* 110* 105* 88 130*    Recent Results (from the past 240 hour(s))  Culture, blood (routine x 2)     Status: None   Collection Time: 06/07/14  9:50 AM  Result Value Ref Range Status   Specimen Description BLOOD LEFT WRIST  Final   Special Requests BOTTLES DRAWN AEROBIC ONLY 8CC  Final   Culture   Final    NO GROWTH 5 DAYS Performed at [facility]    Report Status 06/13/2014 FINAL  Final  Culture, blood (routine x 2)     Status: None   Collection Time: 06/07/14  9:55 AM  Result Value Ref Range Status   Specimen Description BLOOD LEFT HAND  Final   Special Requests BOTTLES DRAWN AEROBIC ONLY 10CC  Final   Culture   Final    NO GROWTH 5 DAYS Performed at [facility]    Report Status 06/13/2014 FINAL  Final  Clostridium Difficile by PCR     Status: None   Collection Time: 06/07/14  1:21 PM  Result Value Ref Range Status   C difficile by pcr NEGATIVE NEGATIVE Final  Clostridium Difficile by PCR (not at [facility])     Status: None   Collection Time: 06/13/14  9:53 AM  Result Value Ref Range Status   C difficile by pcr NEGATIVE NEGATIVE Final     Scheduled Meds: . antiseptic oral rinse  7 mL Mouth Rinse q12n4p  . chlorhexidine  15 mL Mouth Rinse BID  . feeding supplement (GLUCERNA SHAKE)  237 mL Oral TID BM  . folic acid  1 mg Oral Daily  . insulin aspart  0-9 Units Subcutaneous TID WC  .  levothyroxine  25 mcg Oral QAC breakfast  . metoprolol tartrate  25 mg Oral BID  . thiamine  100 mg Oral Daily  . warfarin  7.5 mg Oral ONCE-1800  . Warfarin - Pharmacist Dosing Inpatient   Does not apply q1800   Continuous Infusions: . heparin 1,200 Units/hr (06/13/14 1341)  . lactated ringers 10 mL/hr at 06/03/14 1015     Kajuan Guyton, DO  [facility] Pager [phone]  If 7PM-7AM, please contact night-coverage [email or web address] Password [email or web address] 06/13/2014, 3:39 PM   LOS: 15 days

## 2014-06-13 NOTE — Progress Notes (Signed)
Occupational Therapy Treatment Patient Details Name: Hannah [name] MRN: [id number] DOB: 1940/01/19 Today's Date: 06/13/2014    History of present illness Pt is a 74 year old female with a PMH of DVT s/p IVC filter, HTN, DM, and diverticulitis admitted for abdominal pain, nausea, vomiting, and sigmoid colon stricture. Pt underwent exploratory lap with colectomy and colostomy on 06/03/14   OT comments  Making excellent progress. Ambulating with min A and requiring Min A for ADL Pt limited by endurance. Will benefit from rehab at [facility]. Pt very motivated to return to PLOF. Will continue to follow acutely.  Follow Up Recommendations  Supervision/Assistance - 24 hour;SNF    Equipment Recommendations  Other (comment) (TBD at SNF)    Recommendations for Other Services      Precautions / Restrictions Precautions Precautions: Fall Precaution Comments: colostomy       Mobility Bed Mobility               General bed mobility comments: pt up in chair  Transfers Overall transfer level: Needs assistance Equipment used: Rolling walker (2 wheeled) Transfers: Sit to/from [employer] Sit to Stand: Min assist Stand pivot transfers: Min assist            Balance           Standing balance support: During functional activity;Single extremity supported Standing balance-Leahy Scale: Fair                     ADL Overall ADL's : Needs assistance/impaired Eating/Feeding: Modified independent   Grooming: Set up   Upper Body Bathing: Set up;Sitting   Lower Body Bathing: Minimal assistance;Sit to/from stand   Upper Body Dressing : Set up   Lower Body Dressing: Moderate assistance   Toilet Transfer: Minimal assistance   Toileting- Clothing Manipulation and Hygiene: Minimal assistance       Functional mobility during ADLs: Minimal assistance General ADL Comments: Making excellent progress. Able to stand at sink for short periods  -@ before  needing to sit. Pt very excited aobut completing her own self care. Limited by poor endurance..Only needing min A for sit - stand. using sink to assist with mobility.      Vision                     Perception     Praxis      Cognition   Behavior During Therapy: WFL for tasks assessed/performed Overall Cognitive Status: Within Functional Limits for tasks assessed  improved cognition this session                     Extremity/Trunk Assessment     BUE generalized weakness                            Pertinent Vitals/ Pain       Pain Assessment: Faces Faces Pain Scale: Hurts little more Pain Location: abdomen Pain Descriptors / Indicators: Guarding Pain Intervention(s): Limited activity within patient's tolerance  Home Living                                          Prior Functioning/Environment              Frequency Min 2X/week     Progress Toward Goals  OT Goals(current goals can  now be found in the care plan section)  Progress towards OT goals: Progressing toward goals  Acute Rehab OT Goals Patient Stated Goal: to be independent OT Goal Formulation: With patient Time For Goal Achievement: 06/20/14 Potential to Achieve Goals: Good ADL Goals Pt Will Perform Upper Body Bathing: with set-up;with supervision;sitting Pt Will Perform Lower Body Bathing: with min assist;sit to/from stand Pt Will Perform Upper Body Dressing: with set-up;sitting Pt Will Perform Lower Body Dressing: with min assist;with adaptive equipment;sit to/from stand Pt Will Transfer to Toilet: with min guard assist;ambulating;bedside commode Pt Will Perform Toileting - Clothing Manipulation and hygiene: with supervision;sit to/from stand  Plan Discharge plan remains appropriate    Co-evaluation                 End of Session Equipment Utilized During Treatment: Gait belt;Rolling walker   Activity Tolerance Patient tolerated treatment  well   Patient Left in chair;with call bell/phone within reach;with chair alarm set   Nurse Communication Mobility status        Time: 8372-9021 OT Time Calculation (min): 40 min  Charges: OT General Charges $OT Visit: 1 Procedure OT Treatments $Self Care/Home Management : 38-52 mins  Hannah [name],Hannah [name] 06/13/2014, 10:07 AM   Hannah [name], OTR/L  [phone] 06/13/2014

## 2014-06-13 NOTE — Progress Notes (Signed)
ANTICOAGULATION CONSULT NOTE - Follow Up Consult  Pharmacy Consult for Heparin  Indication: DVT  Allergies  Allergen Reactions  . Augmentin [Amoxicillin-Pot Clavulanate] Other (See Comments)    Severe vaginal itching  . Tranxene [Clorazepate] Itching    Patient Measurements: Height: 5\' 7"  (170.2 cm) Weight: 216 lb 14.9 oz (98.4 kg) IBW/kg (Calculated) : 61.6  Heparin Dosing Weight: 81.5kg  Vital Signs: Temp: 98.8 F (37.1 C) (06/10 1423) Temp Source: Oral (06/10 1423) BP: 118/55 mmHg (06/10 1423) Pulse Rate: 94 (06/10 1423)  Labs:  Recent Labs  06/11/14 0452 06/12/14 0522  06/12/14 0943  06/13/14 0456 06/13/14 1205 06/13/14 1944  HGB 8.9* 9.4*  --   --   --  9.1*  --   --   HCT 27.0* 28.3*  --   --   --  28.0*  --   --   PLT 211 226  --   --   --  245  --   --   LABPROT 15.7*  --   --  16.0*  --  16.7*  --   --   INR 1.23  --   --  1.26  --  1.34  --   --   HEPARINUNFRC 0.66  --   < >  --   < > 0.76* 0.77* 0.79*  CREATININE 0.91 0.91  --   --   --   --   --   --   < > = values in this interval not displayed.  Estimated Creatinine Clearance: 66.3 mL/min (by C-G formula based on Cr of 0.91).   Assessment: 74 yo female with hx of DVT & PE is currently on therapeutic heparin at a rate of 1200 units / hr Heparin still supra-therapeutic this evening at 0.79  Goal of Therapy:  INR 2 - 3 Heparin level 0.3-0.7 units/ml Monitor platelets by anticoagulation protocol: Yes   Plan:  -Decrease heparin to 1000 units/hr -Follow up AM heparin level -Monitor for bleeding  Thank you Okey Regal, PharmD [phone]  06/13/2014 8:05 PM

## 2014-06-13 NOTE — Progress Notes (Signed)
ANTICOAGULATION CONSULT NOTE - Follow Up Consult  Pharmacy Consult for Heparin  Indication: DVT  Allergies  Allergen Reactions  . Augmentin [Amoxicillin-Pot Clavulanate] Other (See Comments)    Severe vaginal itching  . Tranxene [Clorazepate] Itching    Patient Measurements: Height: 5\' 7"  (170.2 cm) Weight: 216 lb 14.9 oz (98.4 kg) IBW/kg (Calculated) : 61.6  Heparin Dosing Weight: 81.5kg  Vital Signs: Temp: 98.9 F (37.2 C) (06/10 0532) Temp Source: Oral (06/10 0532) BP: 123/59 mmHg (06/10 1104) Pulse Rate: 100 (06/10 1104)  Labs:  Recent Labs  06/11/14 0452 06/12/14 0522  06/12/14 0943 06/12/14 1400 06/13/14 0456 06/13/14 1205  HGB 8.9* 9.4*  --   --   --  9.1*  --   HCT 27.0* 28.3*  --   --   --  28.0*  --   PLT 211 226  --   --   --  245  --   LABPROT 15.7*  --   --  16.0*  --  16.7*  --   INR 1.23  --   --  1.26  --  1.34  --   HEPARINUNFRC 0.66  --   < >  --  0.58 0.76* 0.77*  CREATININE 0.91 0.91  --   --   --   --   --   < > = values in this interval not displayed.  Estimated Creatinine Clearance: 66.3 mL/min (by C-G formula based on Cr of 0.91).   Assessment: 74 yo female with hx of DVT & PE is currently on therapeutic heparin at a rate of 1400 units/hr. INR today is 1.34. Heparin level is again supratherapeutic at 0.77  S/P exp lap with partial colectomy/colostomy 6/1. Hgb 9.1 and platelets 245 (both increasing overall. No bleeding noted.  No change in INR after 3 doses of warfarin.   Goal of Therapy:  INR 2 - 3 Heparin level 0.3-0.7 units/ml Monitor platelets by anticoagulation protocol: Yes   Plan:  -Decrease heparin to 1200 units/hr -HL at 2000 -Warfarin 7.5 mg x 1 -Daily CBC/HL/INR -Monitor for bleeding  Isaac Bliss, PharmD, BCPS Clinical Pharmacist Pager [phone] 06/13/2014 1:22 PM

## 2014-06-13 NOTE — Progress Notes (Signed)
Calorie Count Note  48 hour calorie count ordered.  Diet: Soft  Supplements: Glucerna Shake po TID, each supplement provides 220 kcal and 10 grams of protein  Pt was working with therapy at time of visit. Noted minimal intake of breakfast tray (<25%), however, tray was recently delivered, so unsure if non-completion was due to poor appetite or working with therapy. Meal completion recorded as 10-15% per doc flowsheets.   Staff report pt is taking nutritional supplements. MD substituted Ensure for Glucerna.   Estimated Nutritional Needs:  Kcal: 1900-2100  Protein: 95-105 grams  Fluid: 1.9-2.1 L  Nutrition Dx: Inadequate oral intake related to altered GI function as evidenced by other (see comment) (TPN weaning, just advanced to soft diet); ongoing  Goal: Patient will meet greater than or equal to 90% of their needs; progressing  Intervention: Continue Glucerna Shake po TID, each supplement provides 220 kcal and 10 grams of protein.  RD will follow-up with calorie count results on Monday, 06/16/14  Naveen Clardy A. Mayford Knife, RD, LDN, CDE Pager: [phone] After hours Pager: [phone]

## 2014-06-13 NOTE — Progress Notes (Signed)
10 Days Post-Op  Subjective: States she normally doesn't eat much at home. Tech reports she is drinking the ensure. O/w no complaints. Got out of bed with nurse yesterday  Objective: Vital signs in last 24 hours: Temp:  [98.6 F (37 C)-99 F (37.2 C)] 98.9 F (37.2 C) (06/10 0532) Pulse Rate:  [84-98] 98 (06/10 0532) Resp:  [16-18] 18 (06/10 0532) BP: (122-148)/(48-77) 130/58 mmHg (06/10 0532) SpO2:  [97 %-100 %] 97 % (06/10 0532) Last BM Date: 06/12/14  Intake/Output from previous day: 06/09 0701 - 06/10 0700 In: 263 [P.O.:263] Out: 850 [Urine:850] Intake/Output this shift:    Alert, nontoxic Obese Open midline incision - good granulation tissue, no purulent drainage. No exposed bowel; however greenish hue to gauze packing & with odor c/w pseudomonas. Ostomy viable.  Lab Results:   Recent Labs  06/12/14 0522 06/13/14 0456  WBC 15.5* 12.5*  HGB 9.4* 9.1*  HCT 28.3* 28.0*  PLT 226 245   BMET  Recent Labs  06/11/14 0452 06/12/14 0522  NA 130* 133*  K 3.9 4.2  CL 100* 102  CO2 25 23  GLUCOSE 116* 109*  BUN 20 14  CREATININE 0.91 0.91  CALCIUM 8.1* 8.2*   PT/INR  Recent Labs  06/12/14 0943 06/13/14 0456  LABPROT 16.0* 16.7*  INR 1.26 1.34   ABG No results for input(s): PHART, HCO3 in the last 72 hours.  Invalid input(s): PCO2, PO2  Studies/Results: No results found.  Anti-infectives: Anti-infectives    Start     Dose/Rate Route Frequency Ordered Stop   06/07/14 1000  piperacillin-tazobactam (ZOSYN) IVPB 3.375 g  Status:  Discontinued     3.375 g 12.5 mL/hr over 240 Minutes Intravenous 3 times per day 06/07/14 0930 06/12/14 1614   06/03/14 1615  gentamicin (GARAMYCIN) IVPB 100 mg  Status:  Discontinued    Comments:  Please make hang time appropriate with when pre-op abx was given   100 mg 200 mL/hr over 30 Minutes Intravenous  Once 06/03/14 1611 06/03/14 1616   06/03/14 1615  clindamycin (CLEOCIN) IVPB 900 mg  Status:  Discontinued     900  mg 100 mL/hr over 30 Minutes Intravenous  Once 06/03/14 1611 06/03/14 1617   06/03/14 0900  gentamicin (GARAMYCIN) IVPB 100 mg     100 mg 200 mL/hr over 30 Minutes Intravenous To [facility] 06/02/14 1111 06/03/14 1130   06/03/14 0900  clindamycin (CLEOCIN) IVPB 900 mg     900 mg 100 mL/hr over 30 Minutes Intravenous To [facility] 06/02/14 1208 06/03/14 1130   06/02/14 1115  clindamycin (CLEOCIN) IVPB 900 mg  Status:  Discontinued     900 mg 100 mL/hr over 30 Minutes Intravenous On call to O.R. 06/02/14 1111 06/02/14 1206      Assessment/Plan: s/p Procedure(s): PARTIAL COLECTOMY (N/A) COLOSTOMY (N/A) Benign colonic stricture POD 10  s/p ex lap with partial colectomy and colostomy --> Dr. Luisa Hart -cont PT -pt NEEDS TO MOBILIZE - spoke with nurse -WOC following patient for new colostomy -wound is clean, however has greenish tint to dressing, will culture wound. BID Wet to dry dressing changes Leukocytosis -CT of abdomen 6/4 without surgical complications to explain leukocytosis. WBC is starting to trend down. No fever. Check wound culture DVT prophylaxis -heparin drip (h/o PE)/SCDs, coumadin FEN-DC TPN, continue with current diet, add calorie counts; switch to glucerna and increase number of shakes. Dispo-i think can move toward placement/dispo  Mary Sella. Andrey Campanile, MD, FACS General[facility] B[facility]   LOS: 15 days    [facility] 06/13/2014

## 2014-06-14 DIAGNOSIS — I10 Essential (primary) hypertension: Secondary | ICD-10-CM

## 2014-06-14 LAB — HEPARIN LEVEL (UNFRACTIONATED)
Heparin Unfractionated: <0.1 IU/mL — ABNORMAL LOW (ref 0.30–0.70)
Heparin Unfractionated: 0.4 IU/mL (ref 0.30–0.70)

## 2014-06-14 LAB — CBC
HCT: 27.8 % — ABNORMAL LOW (ref 36.0–46.0)
Hemoglobin: 9 g/dL — ABNORMAL LOW (ref 12.0–15.0)
MCH: 27.4 pg (ref 26.0–34.0)
MCHC: 32.4 g/dL (ref 30.0–36.0)
MCV: 84.5 fL (ref 78.0–100.0)
Platelets: 269 10*3/uL (ref 150–400)
RBC: 3.29 MIL/uL — ABNORMAL LOW (ref 3.87–5.11)
RDW: 14.8 % (ref 11.5–15.5)
WBC: 11.4 10*3/uL — ABNORMAL HIGH (ref 4.0–10.5)

## 2014-06-14 LAB — PROTIME-INR
INR: 1.55 — AB (ref 0.00–1.49)
Prothrombin Time: 18.7 seconds — ABNORMAL HIGH (ref 11.6–15.2)

## 2014-06-14 LAB — GLUCOSE, CAPILLARY
GLUCOSE-CAPILLARY: 114 mg/dL — AB (ref 65–99)
Glucose-Capillary: 90 mg/dL (ref 65–99)
Glucose-Capillary: 106 mg/dL — ABNORMAL HIGH (ref 65–99)
Glucose-Capillary: 121 mg/dL — ABNORMAL HIGH (ref 65–99)

## 2014-06-14 MED ORDER — WARFARIN SODIUM 7.5 MG PO TABS
7.5000 mg | ORAL_TABLET | Freq: Once | ORAL | Status: AC
  Administered 2014-06-14: 7.5 mg via ORAL
  Filled 2014-06-14: qty 1

## 2014-06-14 NOTE — Progress Notes (Signed)
ANTICOAGULATION CONSULT NOTE - Follow Up Consult  Pharmacy Consult for Heparin  Indication: DVT  Allergies  Allergen Reactions  . Augmentin [Amoxicillin-Pot Clavulanate] Other (See Comments)    Severe vaginal itching  . Tranxene [Clorazepate] Itching    Patient Measurements: Height: 5\' 7"  (170.2 cm) Weight: 216 lb 14.9 oz (98.4 kg) IBW/kg (Calculated) : 61.6  Heparin Dosing Weight: 81.5kg  Vital Signs: Temp: 98.2 F (36.8 C) (06/11 0601) Temp Source: Oral (06/11 0601) BP: 130/57 mmHg (06/11 0601) Pulse Rate: 88 (06/11 0601)  Labs:  Recent Labs  06/12/14 0522  06/12/14 0943  06/13/14 0456 06/13/14 1205 06/13/14 1944 06/14/14 0420  HGB 9.4*  --   --   --  9.1*  --   --  9.0*  HCT 28.3*  --   --   --  28.0*  --   --  27.8*  PLT 226  --   --   --  245  --   --  269  LABPROT  --   --  16.0*  --  16.7*  --   --  18.7*  INR  --   --  1.26  --  1.34  --   --  1.55*  HEPARINUNFRC  --   < >  --   < > 0.76* 0.77* 0.79* <0.10*  CREATININE 0.91  --   --   --   --   --   --   --   < > = values in this interval not displayed.  Estimated Creatinine Clearance: 66.3 mL/min (by C-G formula based on Cr of 0.91).   Assessment: 74yo female with hx of DVT & PE is currently on therapeutic heparin at a rate of 1000 units/hr.  INR remains subtherapeutic but slowly trending up.  CBC stable, no bleeding noted.  Heparin now subtherapeutic after rate decrease yesterday evening (verified w/ RN that heparin ran overnight w/o issue).  Goal of Therapy:  INR 2 - 3 Heparin level 0.3-0.7 units/ml Monitor platelets by anticoagulation protocol: Yes   Plan:  -Increase heparin to 1100 units/hr -Warfarin 7.5mg  x1 -Follow up 6hr heparin level -Monitor daily HL, INR, CBC, s/sx bleeding  Waynette Buttery, PharmD Clinical Pharmacy Resident Pager: [phone] 06/14/2014 7:30 AM

## 2014-06-14 NOTE — Progress Notes (Signed)
11 Days Post-Op  Subjective: No real complaints, eating ok  Objective: Vital signs in last 24 hours: Temp:  [98.2 F (36.8 C)-99.4 F (37.4 C)] 98.2 F (36.8 C) (06/11 0601) Pulse Rate:  [88-100] 88 (06/11 0601) Resp:  [16-22] 18 (06/11 0601) BP: (118-137)/(55-68) 130/57 mmHg (06/11 0601) SpO2:  [95 %-98 %] 97 % (06/11 0601) Last BM Date: 06/13/14  Intake/Output from previous day: 06/10 0701 - 06/11 0700 In: 881.2 [P.O.:40; I.V.:841.2] Out: 600 [Urine:400; Stool:200] Intake/Output this shift:    GI: soft nontender nondistended ostomy with air wound fairly pink today  Lab Results:   Recent Labs  06/13/14 0456 06/14/14 0420  WBC 12.5* 11.4*  HGB 9.1* 9.0*  HCT 28.0* 27.8*  PLT 245 269   BMET  Recent Labs  06/12/14 0522  NA 133*  K 4.2  CL 102  CO2 23  GLUCOSE 109*  BUN 14  CREATININE 0.91  CALCIUM 8.2*   PT/INR  Recent Labs  06/13/14 0456 06/14/14 0420  LABPROT 16.7* 18.7*  INR 1.34 1.55*   ABG No results for input(s): PHART, HCO3 in the last 72 hours.  Invalid input(s): PCO2, PO2  Studies/Results: No results found.  Anti-infectives: Anti-infectives    Start     Dose/Rate Route Frequency Ordered Stop   06/07/14 1000  piperacillin-tazobactam (ZOSYN) IVPB 3.375 g  Status:  Discontinued     3.375 g 12.5 mL/hr over 240 Minutes Intravenous 3 times per day 06/07/14 0930 06/12/14 1614   06/03/14 1615  gentamicin (GARAMYCIN) IVPB 100 mg  Status:  Discontinued    Comments:  Please make hang time appropriate with when pre-op abx was given   100 mg 200 mL/hr over 30 Minutes Intravenous  Once 06/03/14 1611 06/03/14 1616   06/03/14 1615  clindamycin (CLEOCIN) IVPB 900 mg  Status:  Discontinued     900 mg 100 mL/hr over 30 Minutes Intravenous  Once 06/03/14 1611 06/03/14 1617   06/03/14 0900  gentamicin (GARAMYCIN) IVPB 100 mg     100 mg 200 mL/hr over 30 Minutes Intravenous To [facility]Stay [facility] 06/02/14 1111 06/03/14 1130   06/03/14 0900   clindamycin (CLEOCIN) IVPB 900 mg     900 mg 100 mL/hr over 30 Minutes Intravenous To [facility]Stay [facility] 06/02/14 1208 06/03/14 1130   06/02/14 1115  clindamycin (CLEOCIN) IVPB 900 mg  Status:  Discontinued     900 mg 100 mL/hr over 30 Minutes Intravenous On call to O.R. 06/02/14 1111 06/02/14 1206      Assessment/Plan: POD 11 s/p ex lap with partial colectomy and colostomy --> Dr. Luisa Hart -cont PT -WOC following patient for new colostomy - BID Wet to dry dressing changes Leukocytosis -CT of abdomen 6/4 without surgical complications to explain leukocytosis. WBC is starting to trend down. No fever. Check wound culture. Dispo-proceed with placement/home   [facility] 06/14/2014

## 2014-06-14 NOTE — Plan of Care (Signed)
Problem: Phase II Progression Outcomes Goal: Discharge plan established Outcome: Completed/Met Date Met:  06/14/14 SNF for Rehab [facility])

## 2014-06-14 NOTE — Progress Notes (Signed)
PROGRESS NOTE  Arriona Prest ZOX:[id number] DOB: 1940-11-24 DOA: 05/29/2014 PCP: [name]., DO  Brief summary 74 yo female w/ history DM, diverticulosis, DVT/PE on chronic Coumadin, HTN, and dyslipidemia. Presented w/ intractable N/V in setting of recent dx colonic stricture. Pt had presented to ER on multiple occasions for abdominal pain that has progressively worsened and has become assoc with N/V. Sx's began one week ago; CT abdomen 5/20 revealed stricture at descending and sigmoid colon. Plan was for pt to check w/ PCP re OK to hold Coumadin for colonoscopy then proceed as OP. On 5/21 INR was 5.49 so she was instructed to hold her Coumadin. Unfortunately her sx's worsened and she came to ER. On colonoscopy, she was found to have stricture so surgery was consulted.She underwent exploratory laparotomy with partial colectomy and colostomy on May 31. Subsequently, patient started developing low-grade fever and her WBC started increasing. CT abdomen was unremarkable for infection. All other infectious workup was negative. She was started on Zosyn 06/07/2014. After no clear source of infection was found, and the patient remained hemodynamically stable, her antibiotics were discontinued. Now she is tolerating a soft diet  Assessment/Plan: Sigmoid colon stricture/postoperative ileus She is status post partial colectomy and colostomy. General surgery is following. Initially seen by GI and underwent flexible sigmoidoscopy which showed sigmoid colon stricture. Subsequently Surgery was consulted and the patient underwent surgery.  -on 06/03/2014--expiratory laparotomy with partial colectomy and colostomy -General surgery is following and managing. CT scan done postoperatively due to fevers and leukocytosis . Did suggested ileus. However, patient has done well. Diet to be advanced by general surgery. -weaned off TPN -continue soft diet--medications changed to oral formulation -Increase  activity -OOB tid  Dehydration/Acute renal failure/hyponatremia/FEN -Sodium level is improved/stable with infusion of normal saline/TPN.  -Creatinine has improved.  -weaned off TPN -Tolerating soft diet although not eating much -06/13/2014--start calorie count -am BMP  Low Grade Fever/Leukocytosis/Possible wound infection -Patient was started on Zosyn on 6/4 for possible surgical wound infection. . -Suspect low-grade fevers may be due to continued atelectasis -Encouraged and incentive spirometry -Increase activity -no evidence for any infection at this time based on extensive testing including chest x-ray, UA, blood cultures, lactic acid, CT of the abdomen.  -UA is unremarkable for infection.  -Chest x-ray shows only atelectasis. CT scan did not show any intra-abdominal infection. -06/07/14--Blood cultures are negative so far. Lactic acid is normal.  -06/07/14 Stool for C. difficile was negative.  -06/12/14--The patient is afebrile hemodynamically stable--discontinue Zosyn and monitor off antibiotics -06/12/14--repeat Cdiff pcr--neg -06/14/14--WBC improving, afebrile and hemodynamically stable; remain off abx  Sinus tachycardia -Secondary to acute medical condition -Overall improved -Patient is asymptomatic  History of pulmonary embolism on anticoagulation with warfarin She was diagnosed with a submassive pulmonary embolism in July 2014. She actually had a PEA arrest and required thrombolytics. -06/10/2014--warfarin restarted -Continue heparin bridge -Patient has IVC filter -When patient is ready for discharge, she will be converted to Lovenox bridge if INR is not therapeutic  Hypothyroidism -Changed IV to by mouth levothyroxine  History of essential Hypertension PRN hydralazine and intravenous beta blocker. -continue metoprolol tartrate 25mg  bid  Diabetes mellitus with neurological manifestations, controlled -change SSI to q ac and hs -04/09/2014 hemoglobin A1c  7.3 -Hold metformin and glipizide  Hypokalemia Repleted. Continue to monitor.  Normocytic anemia Some drop noted in Hemoglobin. No overt bleeding. Continue to monitor.  DVT Prophylaxis: On IV heparin  Code Status: Full  code  Family Communication: Discussed with daughter at bedside  Disposition Plan:SNF in 1-2 days when cleared by surgery       Procedures/Studies: Ct Abdomen Pelvis W Contrast  06/07/2014   CLINICAL DATA:  Generalized abdominal pain. Worsening leukocytosis. Postop from partial colectomy with colostomy.  EXAM: CT ABDOMEN AND PELVIS WITH CONTRAST  TECHNIQUE: Multidetector CT imaging of the abdomen and pelvis was performed using the standard protocol following bolus administration of intravenous contrast.  CONTRAST:  OMNIPAQUE IOHEXOL 300 MG/ML  SOLN  COMPARISON:  05/23/2014  FINDINGS: Lower Chest: Increased bilateral lower lobe subsegmental atelectasis since prior exam.  Hepatobiliary: No masses or other significant abnormality identified. Gallbladder is unremarkable.  Pancreas: No mass, inflammatory changes, or other significant abnormality identified.  Spleen:  Within normal limits in size and appearance.  Adrenals:  No masses identified.  Kidneys/Urinary Tract:  No evidence of masses or hydronephrosis.  Stomach/Bowel/Peritoneum: Postop changes are seen from sigmoid colon resection and left lower quadrant colostomy. Decreased colonic dilatation is seen since previous study. Mild dilatation of small bowel loops is seen without evidence of focal transition point, likely due to postop ileus. No evidence of abscess or free fluid.  Vascular/Lymphatic: No pathologically enlarged lymph nodes identified. IVC filter remains in place. No other significant abnormality visualized.  Reproductive: Prior hysterectomy noted. Adnexal regions are unremarkable in appearance.  Other: Left hip prosthesis results in beam hardening artifact through the inferior aspect of the pelvis.   Musculoskeletal:  No suspicious bone lesions identified.  IMPRESSION: Expected postop changes from sigmoid colon resection with left lower quadrant colostomy. Decreased colonic dilatation, with mild postop ileus pattern.  No evidence of abscess or free fluid.  Increased bibasilar atelectasis.   Electronically Signed   By: Myles Rosenthal M.D.   On: 06/07/2014 20:36   Dg Chest Port 1 View  06/06/2014   CLINICAL DATA:  Fever, history of DVT, pulmonary embolism, hypertension, diabetes  EXAM: PORTABLE CHEST - 1 VIEW  COMPARISON:  Portable exam 0855 hours compared 08/29/2012  FINDINGS: RIGHT arm PICC line tip projects over cavoatrial junction.  Enlargement of cardiac silhouette with pulmonary vascular congestion.  Atherosclerotic calcification aorta.  Chronic elevation RIGHT diaphragm.  Decreased lung volumes with RIGHT basilar atelectasis.  No definite infiltrate, pleural effusion or pneumothorax.  IMPRESSION: Decreased lung volumes with RIGHT basilar atelectasis.  Enlargement of cardiac silhouette with pulmonary vascular congestion.   Electronically Signed   By: Ulyses Southward M.D.   On: 06/06/2014 09:07   Dg Abd 2 Views  05/27/2014   CLINICAL DATA:  Generalized abdominal pain and vomiting for 5 days.  EXAM: ABDOMEN - 2 VIEW  COMPARISON:  CT 05/23/2014  FINDINGS: There is gaseous distention of the colon. No small bowel dilatation to suggest small bowel obstruction. No free air, organomegaly or suspicious calcification. IVC filter in place. Prior left hip replacement.  IMPRESSION: Mild gaseous distention of the colon, possibly mild ileus.   Electronically Signed   By: Charlett Nose M.D.   On: 05/27/2014 16:07   Ct Cta Abd/pel W/cm &/or W/o Cm  05/23/2014   CLINICAL DATA:  Diffuse abdominal pain.  EXAM: CTA ABDOMEN AND PELVIS WITH CONTRAST  TECHNIQUE: Multidetector CT imaging of the abdomen and pelvis was performed using the standard protocol during bolus administration of intravenous contrast. Multiplanar reconstructed  images and MIPs were obtained and reviewed to evaluate the vascular anatomy.  CONTRAST:  80mL OMNIPAQUE IOHEXOL 350 MG/ML SOLN  COMPARISON:  CT scan dated 07/15/2012  FINDINGS:  There is minimal calcification in the abdominal aorta and iliac arteries. Superior mesenteric, inferior mesenteric and celiac arteries are widely patent. There are 2 widely patent artery to each each kidney.  The superior mesenteric vein and portal vein are normal.  There is a small amount of ascites anterior to the right lobe of the liver just below the diaphragm. Liver is otherwise normal. Biliary tree, spleen, pancreas, adrenal glands, and kidneys demonstrate no significant abnormality. There is slight hyperplasia of both adrenal glands, unchanged.  There is diffuse distention of the colon to the proximal sigmoid region. The colon is filled with air and fluid. No dilated small bowel. Terminal ileum and appendix are normal.  There is a transition point at the junction of the descending colon with the sigmoid. There is a small amount of free fluid in the left pericolic gutter adjacent to this transition point and also small amount of free fluid in the pelvis. There are multiple diverticula in the sigmoid colon below the transition point.  The patient has had diverticulitis in this area in the past. This may represent a diverticular stricture although I cannot exclude a mucosal mass at this transition point.  Filter in the IVC.  No acute osseous abnormalities.  Rectum appears normal. Bladder is normal. Uterus appears to have been removed.  Review of the MIP images confirms the above findings.  IMPRESSION: 1. No significant vascular abnormality of the abdomen. No arterial stenoses. Widely patent SMA and SMV. 2. Diffuse dilatation of the fluid-filled colon to the junction of the descending and sigmoid portions. The possibility of a stricture secondary to prior diverticulitis or mucosal mass should be considered. 3. Small amount of ascites in  the abdomen.   Electronically Signed   By: Francene Boyers M.D.   On: 05/23/2014 13:04         Subjective: Patient denies fevers, chills, chest pain, shortness breath, vomiting, abdominal pain, dysuria, hematuria. Her appetite is fair. She is not eating great.  Objective: Filed Vitals:   06/13/14 2156 06/14/14 0601 06/14/14 0934 06/14/14 1303  BP: 137/68 130/57 128/64 155/73  Pulse: 99 88 86 88  Temp: 99.4 F (37.4 C) 98.2 F (36.8 C)  98.6 F (37 C)  TempSrc: Oral Oral  Oral  Resp: 22 18  18   Height:      Weight:      SpO2: 95% 97%  96%    Intake/Output Summary (Last 24 hours) at 06/14/14 1833 Last data filed at 06/14/14 1334  Gross per 24 hour  Intake    400 ml  Output    401 ml  Net     -1 ml   Weight change:  Exam:   General:  Pt is alert, follows commands appropriately, not in acute distress  HEENT: No icterus, No thrush, [place]/AT  Cardiovascular: RRR, S1/S2, no rubs, no gallops  Respiratory: CTA bilaterally, no wheezing, no crackles, no rhonchi  Abdomen: Soft/+BS, non tender, non distended, no guarding  Extremities: trace edema, No lymphangitis, No petechiae, No rashes, no synovitis  Data Reviewed: Basic Metabolic Panel:  Recent Labs Lab 06/08/14 0542 06/09/14 0556 06/10/14 0508 06/11/14 0452 06/12/14 0522  NA 132* 134* 130* 130* 133*  K 4.2 4.1 4.0 3.9 4.2  CL 99* 100* 99* 100* 102  CO2 25 26 26 25 23   GLUCOSE 92 73 114* 116* 109*  BUN 30* 23* 24* 20 14  CREATININE 0.86 0.86 0.97 0.91 0.91  CALCIUM 8.4* 8.3* 8.1* 8.1* 8.2*  MG  --  1.6* 1.9  --   --   PHOS  --  3.9  --   --   --    Liver Function Tests:  Recent Labs Lab 06/09/14 0556  AST 22  ALT 22  ALKPHOS 70  BILITOT 0.5  PROT 5.4*  ALBUMIN 1.8*   No results for input(s): LIPASE, AMYLASE in the last 168 hours. No results for input(s): AMMONIA in the last 168 hours. CBC:  Recent Labs Lab 06/09/14 0556 06/10/14 0508 06/11/14 0452 06/12/14 0522 06/13/14 0456  06/14/14 0420  WBC 16.6* 15.1* 13.7* 15.5* 12.5* 11.4*  NEUTROABS 12.4*  --   --  11.8*  --   --   HGB 9.5* 9.3* 8.9* 9.4* 9.1* 9.0*  HCT 28.6* 28.3* 27.0* 28.3* 28.0* 27.8*  MCV 84.4 84.0 84.4 84.0 84.1 84.5  PLT 223 220 211 226 245 269   Cardiac Enzymes: No results for input(s): CKTOTAL, CKMB, CKMBINDEX, TROPONINI in the last 168 hours. BNP: Invalid input(s): POCBNP CBG:  Recent Labs Lab 06/13/14 1747 06/13/14 2144 06/14/14 0750 06/14/14 1142 06/14/14 1634  GLUCAP 110* 96 90 106* 121*    Recent Results (from the past 240 hour(s))  Culture, blood (routine x 2)     Status: None   Collection Time: 06/07/14  9:50 AM  Result Value Ref Range Status   Specimen Description BLOOD LEFT WRIST  Final   Special Requests BOTTLES DRAWN AEROBIC ONLY 8CC  Final   Culture   Final    NO GROWTH 5 DAYS Performed at [facility]    Report Status 06/13/2014 FINAL  Final  Culture, blood (routine x 2)     Status: None   Collection Time: 06/07/14  9:55 AM  Result Value Ref Range Status   Specimen Description BLOOD LEFT HAND  Final   Special Requests BOTTLES DRAWN AEROBIC ONLY 10CC  Final   Culture   Final    NO GROWTH 5 DAYS Performed at [facility]    Report Status 06/13/2014 FINAL  Final  Clostridium Difficile by PCR     Status: None   Collection Time: 06/07/14  1:21 PM  Result Value Ref Range Status   C difficile by pcr NEGATIVE NEGATIVE Final  Clostridium Difficile by PCR (not at [facility])     Status: None   Collection Time: 06/13/14  9:53 AM  Result Value Ref Range Status   C difficile by pcr NEGATIVE NEGATIVE Final  Wound culture     Status: None (Preliminary result)   Collection Time: 06/13/14  9:54 AM  Result Value Ref Range Status   Specimen Description WOUND ABDOMEN  Final   Special Requests OPEN MIDLINE WOUND  Final   Gram Stain   Final    FEW WBC PRESENT,BOTH PMN AND MONONUCLEAR NO SQUAMOUS EPITHELIAL CELLS SEEN NO ORGANISMS SEEN Performed at [facility]    Culture PENDING  Incomplete   Report Status PENDING  Incomplete     Scheduled Meds: . antiseptic oral rinse  7 mL Mouth Rinse q12n4p  . chlorhexidine  15 mL Mouth Rinse BID  . feeding supplement (GLUCERNA SHAKE)  237 mL Oral TID BM  . folic acid  1 mg Oral Daily  . insulin aspart  0-9 Units Subcutaneous TID WC  . levothyroxine  25 mcg Oral QAC breakfast  . metoprolol tartrate  25 mg Oral BID  . thiamine  100 mg Oral Daily  . warfarin  7.5 mg Oral ONCE-1800  . Warfarin - Pharmacist  Dosing Inpatient   Does not apply q1800   Continuous Infusions: . heparin 1,100 Units/hr (06/14/14 0933)  . lactated ringers 10 mL/hr at 06/03/14 1015     Nayel Purdy, DO  [facility] Pager [phone]  If 7PM-7AM, please contact night-coverage [email or web address] Password [email or web address] 06/14/2014, 6:33 PM   LOS: 16 days

## 2014-06-14 NOTE — Progress Notes (Signed)
ANTICOAGULATION CONSULT NOTE - Follow Up Consult  Pharmacy Consult for Heparin  Indication: DVT  Allergies  Allergen Reactions  . Augmentin [Amoxicillin-Pot Clavulanate] Other (See Comments)    Severe vaginal itching  . Tranxene [Clorazepate] Itching    Patient Measurements: Height: 5\' 7"  (170.2 cm) Weight: 216 lb 14.9 oz (98.4 kg) IBW/kg (Calculated) : 61.6  Heparin Dosing Weight: 81.5kg  Vital Signs: Temp: 98.6 F (37 C) (06/11 1303) Temp Source: Oral (06/11 1303) BP: 155/73 mmHg (06/11 1303) Pulse Rate: 88 (06/11 1303)  Labs:  Recent Labs  06/12/14 0522  06/12/14 0943  06/13/14 0456  06/13/14 1944 06/14/14 0420 06/14/14 1439  HGB 9.4*  --   --   --  9.1*  --   --  9.0*  --   HCT 28.3*  --   --   --  28.0*  --   --  27.8*  --   PLT 226  --   --   --  245  --   --  269  --   LABPROT  --   --  16.0*  --  16.7*  --   --  18.7*  --   INR  --   --  1.26  --  1.34  --   --  1.55*  --   HEPARINUNFRC  --   < >  --   < > 0.76*  < > 0.79* <0.10* 0.40  CREATININE 0.91  --   --   --   --   --   --   --   --   < > = values in this interval not displayed.  Estimated Creatinine Clearance: 66.3 mL/min (by C-G formula based on Cr of 0.91).   Assessment: 74yo female with hx of DVT & PE is currently on therapeutic heparin at a rate of 1000 units/hr.  INR remains subtherapeutic but slowly trending up.  CBC stable, no bleeding noted.  Heparin now therapeutic at 0.40 at a rate of 1100 units/hr  Goal of Therapy:  INR 2 - 3 Heparin level 0.3-0.7 units/ml Monitor platelets by anticoagulation protocol: Yes   Plan:  -Continue heparin at 1100 units/hr -Warfarin 7.5mg  x1 -Monitor daily HL, INR, CBC, s/sx bleeding  Isaac Bliss, PharmD, BCPS Clinical Pharmacist Pager [phone] 06/14/2014 3:24 PM

## 2014-06-15 DIAGNOSIS — Z86711 Personal history of pulmonary embolism: Secondary | ICD-10-CM

## 2014-06-15 LAB — CBC
HEMATOCRIT: 28.8 % — AB (ref 36.0–46.0)
HEMOGLOBIN: 9 g/dL — AB (ref 12.0–15.0)
MCH: 26.5 pg (ref 26.0–34.0)
MCHC: 31.3 g/dL (ref 30.0–36.0)
MCV: 85 fL (ref 78.0–100.0)
Platelets: 282 10*3/uL (ref 150–400)
RBC: 3.39 MIL/uL — ABNORMAL LOW (ref 3.87–5.11)
RDW: 14.8 % (ref 11.5–15.5)
WBC: 11.8 10*3/uL — ABNORMAL HIGH (ref 4.0–10.5)

## 2014-06-15 LAB — HEPARIN LEVEL (UNFRACTIONATED): Heparin Unfractionated: 0.36 IU/mL (ref 0.30–0.70)

## 2014-06-15 LAB — PROTIME-INR
INR: 2.07 — AB (ref 0.00–1.49)
Prothrombin Time: 23.1 seconds — ABNORMAL HIGH (ref 11.6–15.2)

## 2014-06-15 LAB — GLUCOSE, CAPILLARY
GLUCOSE-CAPILLARY: 109 mg/dL — AB (ref 65–99)
Glucose-Capillary: 92 mg/dL (ref 65–99)
Glucose-Capillary: 92 mg/dL (ref 65–99)
Glucose-Capillary: 135 mg/dL — ABNORMAL HIGH (ref 65–99)

## 2014-06-15 LAB — BASIC METABOLIC PANEL
Anion gap: 8 (ref 5–15)
BUN: 8 mg/dL (ref 6–20)
CHLORIDE: 102 mmol/L (ref 101–111)
CO2: 22 mmol/L (ref 22–32)
CREATININE: 0.83 mg/dL (ref 0.44–1.00)
Calcium: 8.2 mg/dL — ABNORMAL LOW (ref 8.9–10.3)
GFR calc Af Amer: >60 mL/min (ref >60)
GLUCOSE: 112 mg/dL — AB (ref 65–99)
Potassium: 3.8 mmol/L (ref 3.5–5.1)
SODIUM: 132 mmol/L — AB (ref 135–145)

## 2014-06-15 MED ORDER — GLUCERNA SHAKE PO LIQD: 237.0000 mL | Freq: Three times a day (TID) | ORAL | Status: DC

## 2014-06-15 MED ORDER — WARFARIN SODIUM 2.5 MG PO TABS
2.5000 mg | ORAL_TABLET | Freq: Once | ORAL | Status: AC
  Administered 2014-06-15: 2.5 mg via ORAL
  Filled 2014-06-15: qty 1

## 2014-06-15 NOTE — Progress Notes (Signed)
PROGRESS NOTE  Hannah [name] FHL:[id number] DOB: 06-05-1940 DOA: 05/29/2014 PCP: [name]., DO  Brief summary 74 yo female w/ history DM, diverticulosis, DVT/PE on chronic Coumadin, HTN, and dyslipidemia. Presented w/ intractable N/V in setting of recent dx colonic stricture. Pt had presented to ER on multiple occasions for abdominal pain that has progressively worsened and has become assoc with N/V. Sx's began one week ago; CT abdomen 5/20 revealed stricture at descending and sigmoid colon. Plan was for pt to check w/ PCP re OK to hold Coumadin for colonoscopy then proceed as OP. On 5/21 INR was 5.49 so she was instructed to hold her Coumadin. Unfortunately her sx's worsened and she came to ER. On colonoscopy, she was found to have stricture so surgery was consulted.She underwent exploratory laparotomy with partial colectomy and colostomy on May 31. Subsequently, patient started developing low-grade fever and her WBC started increasing. CT abdomen was unremarkable for infection. All other infectious workup was negative. She was started on Zosyn 06/07/2014. After no clear source of infection was found, and the patient remained hemodynamically stable, her antibiotics were discontinued. Now she is tolerating a soft diet  Assessment/Plan: Sigmoid colon stricture/postoperative ileus She is status post partial colectomy and colostomy. General surgery is following. Initially seen by GI and underwent flexible sigmoidoscopy which showed sigmoid colon stricture. Subsequently Surgery was consulted and the patient underwent surgery.  -on 06/03/2014--expiratory laparotomy with partial colectomy and colostomy -General surgery is following and managing. CT scan done postoperatively due to fevers and leukocytosis . Did suggested ileus. However, patient has done well. Diet to be advanced by general surgery. -weaned off TPN -continue soft diet--medications changed to oral formulation -Increase  activity -OOB tid  Dehydration/Acute renal failure/hyponatremia/FEN -Sodium level is improved/stable with infusion of normal saline/TPN.  -Creatinine has improved.  -weaned off TPN -Tolerating soft diet although not eating much -06/13/2014--start calorie count -am BMP  Low Grade Fever/Leukocytosis/Possible wound infection -Patient was started on Zosyn on 6/4 for possible surgical wound infection. . -Suspect low-grade fevers may be due to continued atelectasis -Encouraged and incentive spirometry -Increase activity -no evidence for any infection at this time based on extensive testing including chest x-ray, UA, blood cultures, lactic acid, CT of the abdomen.  -UA is unremarkable for infection.  -Chest x-ray shows only atelectasis. CT scan did not show any intra-abdominal infection. -06/07/14--Blood cultures are negative so far. Lactic acid is normal.  -06/07/14 Stool for C. difficile was negative.  -06/12/14--The patient is afebrile hemodynamically stable--discontinue Zosyn and monitor off antibiotics -06/12/14--repeat Cdiff pcr--neg -06/15/14--WBC improving, afebrile and hemodynamically stable; remain off abx  Sinus tachycardia -Secondary to acute medical condition -Overall improved -Patient is asymptomatic  History of pulmonary embolism on anticoagulation with warfarin She was diagnosed with a submassive pulmonary embolism in July 2014. She actually had a PEA arrest and required thrombolytics. -06/10/2014--warfarin restarted -Continue heparin bridge -Patient has IVC filter -06/15/14--INR 2.07, plan to d/c heparin drip if INR therapeutic again 6/13  Hypothyroidism -Changed IV to by mouth levothyroxine  History of essential Hypertension PRN hydralazine and intravenous beta blocker. -continue metoprolol tartrate 25mg  bid  Diabetes mellitus with neurological manifestations, controlled -change SSI to q ac and hs -04/09/2014 hemoglobin A1c 7.3 -Hold metformin and  glipizide  Hypokalemia Repleted. Continue to monitor.  Normocytic anemia Some drop noted in Hemoglobin. No overt bleeding. Continue to monitor.  DVT Prophylaxis: On IV heparin  Code Status: Full code  Family Communication: Discussed with  daughter at bedside  Disposition Plan:SNF 06/16/14 if cleared by surgery       Procedures/Studies: Ct Abdomen Pelvis W Contrast  06/07/2014   CLINICAL DATA:  Generalized abdominal pain. Worsening leukocytosis. Postop from partial colectomy with colostomy.  EXAM: CT ABDOMEN AND PELVIS WITH CONTRAST  TECHNIQUE: Multidetector CT imaging of the abdomen and pelvis was performed using the standard protocol following bolus administration of intravenous contrast.  CONTRAST:  OMNIPAQUE IOHEXOL 300 MG/ML  SOLN  COMPARISON:  05/23/2014  FINDINGS: Lower Chest: Increased bilateral lower lobe subsegmental atelectasis since prior exam.  Hepatobiliary: No masses or other significant abnormality identified. Gallbladder is unremarkable.  Pancreas: No mass, inflammatory changes, or other significant abnormality identified.  Spleen:  Within normal limits in size and appearance.  Adrenals:  No masses identified.  Kidneys/Urinary Tract:  No evidence of masses or hydronephrosis.  Stomach/Bowel/Peritoneum: Postop changes are seen from sigmoid colon resection and left lower quadrant colostomy. Decreased colonic dilatation is seen since previous study. Mild dilatation of small bowel loops is seen without evidence of focal transition point, likely due to postop ileus. No evidence of abscess or free fluid.  Vascular/Lymphatic: No pathologically enlarged lymph nodes identified. IVC filter remains in place. No other significant abnormality visualized.  Reproductive: Prior hysterectomy noted. Adnexal regions are unremarkable in appearance.  Other: Left hip prosthesis results in beam hardening artifact through the inferior aspect of the pelvis.  Musculoskeletal:  No suspicious bone  lesions identified.  IMPRESSION: Expected postop changes from sigmoid colon resection with left lower quadrant colostomy. Decreased colonic dilatation, with mild postop ileus pattern.  No evidence of abscess or free fluid.  Increased bibasilar atelectasis.   Electronically Signed   By: Myles Rosenthal M.D.   On: 06/07/2014 20:36   Dg Chest Port 1 View  06/06/2014   CLINICAL DATA:  Fever, history of DVT, pulmonary embolism, hypertension, diabetes  EXAM: PORTABLE CHEST - 1 VIEW  COMPARISON:  Portable exam 0855 hours compared 08/29/2012  FINDINGS: RIGHT arm PICC line tip projects over cavoatrial junction.  Enlargement of cardiac silhouette with pulmonary vascular congestion.  Atherosclerotic calcification aorta.  Chronic elevation RIGHT diaphragm.  Decreased lung volumes with RIGHT basilar atelectasis.  No definite infiltrate, pleural effusion or pneumothorax.  IMPRESSION: Decreased lung volumes with RIGHT basilar atelectasis.  Enlargement of cardiac silhouette with pulmonary vascular congestion.   Electronically Signed   By: Ulyses Southward M.D.   On: 06/06/2014 09:07   Dg Abd 2 Views  05/27/2014   CLINICAL DATA:  Generalized abdominal pain and vomiting for 5 days.  EXAM: ABDOMEN - 2 VIEW  COMPARISON:  CT 05/23/2014  FINDINGS: There is gaseous distention of the colon. No small bowel dilatation to suggest small bowel obstruction. No free air, organomegaly or suspicious calcification. IVC filter in place. Prior left hip replacement.  IMPRESSION: Mild gaseous distention of the colon, possibly mild ileus.   Electronically Signed   By: Charlett Nose M.D.   On: 05/27/2014 16:07   Ct Cta Abd/pel W/cm &/or W/o Cm  05/23/2014   CLINICAL DATA:  Diffuse abdominal pain.  EXAM: CTA ABDOMEN AND PELVIS WITH CONTRAST  TECHNIQUE: Multidetector CT imaging of the abdomen and pelvis was performed using the standard protocol during bolus administration of intravenous contrast. Multiplanar reconstructed images and MIPs were obtained and  reviewed to evaluate the vascular anatomy.  CONTRAST:  80mL OMNIPAQUE IOHEXOL 350 MG/ML SOLN  COMPARISON:  CT scan dated 07/15/2012  FINDINGS: There is minimal calcification in the abdominal aorta  and iliac arteries. Superior mesenteric, inferior mesenteric and celiac arteries are widely patent. There are 2 widely patent artery to each each kidney.  The superior mesenteric vein and portal vein are normal.  There is a small amount of ascites anterior to the right lobe of the liver just below the diaphragm. Liver is otherwise normal. Biliary tree, spleen, pancreas, adrenal glands, and kidneys demonstrate no significant abnormality. There is slight hyperplasia of both adrenal glands, unchanged.  There is diffuse distention of the colon to the proximal sigmoid region. The colon is filled with air and fluid. No dilated small bowel. Terminal ileum and appendix are normal.  There is a transition point at the junction of the descending colon with the sigmoid. There is a small amount of free fluid in the left pericolic gutter adjacent to this transition point and also small amount of free fluid in the pelvis. There are multiple diverticula in the sigmoid colon below the transition point.  The patient has had diverticulitis in this area in the past. This may represent a diverticular stricture although I cannot exclude a mucosal mass at this transition point.  Filter in the IVC.  No acute osseous abnormalities.  Rectum appears normal. Bladder is normal. Uterus appears to have been removed.  Review of the MIP images confirms the above findings.  IMPRESSION: 1. No significant vascular abnormality of the abdomen. No arterial stenoses. Widely patent SMA and SMV. 2. Diffuse dilatation of the fluid-filled colon to the junction of the descending and sigmoid portions. The possibility of a stricture secondary to prior diverticulitis or mucosal mass should be considered. 3. Small amount of ascites in the abdomen.   Electronically Signed    By: Francene Boyers M.D.   On: 05/23/2014 13:04         Subjective: Patient denies fevers, chills, headache, chest pain, dyspnea, nausea, vomiting, diarrhea, abdominal pain, dysuria, hematuria   Objective: Filed Vitals:   06/14/14 2150 06/15/14 0453 06/15/14 0926 06/15/14 1311  BP: 144/67 119/47 120/52 131/50  Pulse: 98 99 89 90  Temp: 98.8 F (37.1 C) 99.6 F (37.6 C)  98.6 F (37 C)  TempSrc: Oral Oral    Resp: Height:      Weight:      SpO2: 99% 97%  100%    Intake/Output Summary (Last 24 hours) at 06/15/14 1621 Last data filed at 06/15/14 1140  Gross per 24 hour  Intake 861.75 ml  Output    700 ml  Net 161.75 ml   Weight change:  Exam:   General:  Pt is alert, follows commands appropriately, not in acute distress  HEENT: No icterus, No thrush,  [place]/AT  Cardiovascular: RRR, S1/S2, no rubs, no gallops  Respiratory: Diminished BS at bases, but clear to auscultation.  Abdomen: Soft/+BS, non tender, non distended, no guarding  Extremities: trace LE edema, No lymphangitis, No petechiae, No rashes, no synovitis  Data Reviewed: Basic Metabolic Panel:  Recent Labs Lab 06/09/14 0556 06/10/14 0508 06/11/14 0452 06/12/14 0522 06/15/14 0600  NA 134* 130* 130* 133* 132*  K 4.1 4.0 3.9 4.2 3.8  CL 100* 99* 100* 102 102  CO2 GLUCOSE 73 114* 116* 109* 112*  BUN 23* 24* CREATININE 0.86 0.97 0.91 0.91 0.83  CALCIUM 8.3* 8.1* 8.1* 8.2* 8.2*  MG 1.6* 1.9  --   --   --   PHOS 3.9  --   --   --   --  Liver Function Tests:  Recent Labs Lab 06/09/14 0556  AST 22  ALT 22  ALKPHOS 70  BILITOT 0.5  PROT 5.4*  ALBUMIN 1.8*   No results for input(s): LIPASE, AMYLASE in the last 168 hours. No results for input(s): AMMONIA in the last 168 hours. CBC:  Recent Labs Lab 06/09/14 0556  06/11/14 0452 06/12/14 0522 06/13/14 0456 06/14/14 0420 06/15/14 0600  WBC 16.6*  < > 13.7* 15.5* 12.5* 11.4* 11.8*  NEUTROABS  12.4*  --   --  11.8*  --   --   --   HGB 9.5*  < > 8.9* 9.4* 9.1* 9.0* 9.0*  HCT 28.6*  < > 27.0* 28.3* 28.0* 27.8* 28.8*  MCV 84.4  < > 84.4 84.0 84.1 84.5 85.0  PLT 223  < > 211 226 245 269 282  < > = values in this interval not displayed. Cardiac Enzymes: No results for input(s): CKTOTAL, CKMB, CKMBINDEX, TROPONINI in the last 168 hours. BNP: Invalid input(s): POCBNP CBG:  Recent Labs Lab 06/14/14 1142 06/14/14 1634 06/14/14 2149 06/15/14 0807 06/15/14 1140  GLUCAP 106* 121* 114* 92 92    Recent Results (from the past 240 hour(s))  Culture, blood (routine x 2)     Status: None   Collection Time: 06/07/14  9:50 AM  Result Value Ref Range Status   Specimen Description BLOOD LEFT WRIST  Final   Special Requests BOTTLES DRAWN AEROBIC ONLY 8CC  Final   Culture   Final    NO GROWTH 5 DAYS Performed at [facility]    Report Status 06/13/2014 FINAL  Final  Culture, blood (routine x 2)     Status: None   Collection Time: 06/07/14  9:55 AM  Result Value Ref Range Status   Specimen Description BLOOD LEFT HAND  Final   Special Requests BOTTLES DRAWN AEROBIC ONLY 10CC  Final   Culture   Final    NO GROWTH 5 DAYS Performed at [facility]    Report Status 06/13/2014 FINAL  Final  Clostridium Difficile by PCR     Status: None   Collection Time: 06/07/14  1:21 PM  Result Value Ref Range Status   C difficile by pcr NEGATIVE NEGATIVE Final  Clostridium Difficile by PCR (not at [facility])     Status: None   Collection Time: 06/13/14  9:53 AM  Result Value Ref Range Status   C difficile by pcr NEGATIVE NEGATIVE Final  Wound culture     Status: None (Preliminary result)   Collection Time: 06/13/14  9:54 AM  Result Value Ref Range Status   Specimen Description WOUND ABDOMEN  Final   Special Requests OPEN MIDLINE WOUND  Final   Gram Stain   Final    FEW WBC PRESENT,BOTH PMN AND MONONUCLEAR NO SQUAMOUS EPITHELIAL CELLS SEEN NO ORGANISMS SEEN Performed at [facility]    Culture   Final    FEW PSEUDOMONAS AERUGINOSA Performed at [facility]    Report Status PENDING  Incomplete     Scheduled Meds: . antiseptic oral rinse  7 mL Mouth Rinse q12n4p  . chlorhexidine  15 mL Mouth Rinse BID  . feeding supplement (GLUCERNA SHAKE)  237 mL Oral TID BM  . folic acid  1 mg Oral Daily  . insulin aspart  0-9 Units Subcutaneous TID WC  . levothyroxine  25 mcg Oral QAC breakfast  . metoprolol tartrate  25 mg Oral BID  . thiamine  100 mg Oral  Daily  . warfarin  2.5 mg Oral ONCE-1800  . Warfarin - Pharmacist Dosing Inpatient   Does not apply q1800   Continuous Infusions: . lactated ringers 10 mL/hr at 06/03/14 1015     Nikoletta Varma, DO  [facility] Pager [phone]  If 7PM-7AM, please contact night-coverage [email or web address] Password [email or web address] 06/15/2014, 4:21 PM   LOS: 17 days

## 2014-06-15 NOTE — Progress Notes (Signed)
12 Days Post-Op  Subjective: No real complaints. Eating about 20%meals. Drinking about 1/2 of the glucerna  Objective: Vital signs in last 24 hours: Temp:  [98.6 F (37 C)-99.6 F (37.6 C)] 99.6 F (37.6 C) (06/12 0453) Pulse Rate:  [88-99] 89 (06/12 0926) Resp:  [18] 18 (06/12 0453) BP: (119-155)/(47-73) 120/52 mmHg (06/12 0926) SpO2:  [96 %-99 %] 97 % (06/12 0453) Last BM Date: 06/15/14  Intake/Output from previous day: 06/11 0701 - 06/12 0700 In: 1061.8 [P.O.:840; I.V.:221.8] Out: 301 [Urine:301] Intake/Output this shift: Total I/O In: 200 [P.O.:200] Out: -   Alert, nontoxic Soft, obese, nontender. Air/stool in bag  Lab Results:   Recent Labs  06/14/14 0420 06/15/14 0600  WBC 11.4* 11.8*  HGB 9.0* 9.0*  HCT 27.8* 28.8*  PLT 269 282   BMET  Recent Labs  06/15/14 0600  NA 132*  K 3.8  CL 102  CO2 22  GLUCOSE 112*  BUN 8  CREATININE 0.83  CALCIUM 8.2*   PT/INR  Recent Labs  06/14/14 0420 06/15/14 0600  LABPROT 18.7* 23.1*  INR 1.55* 2.07*   ABG No results for input(s): PHART, HCO3 in the last 72 hours.  Invalid input(s): PCO2, PO2  Studies/Results: No results found.  Anti-infectives: Anti-infectives    Start     Dose/Rate Route Frequency Ordered Stop   06/07/14 1000  piperacillin-tazobactam (ZOSYN) IVPB 3.375 g  Status:  Discontinued     3.375 g 12.5 mL/hr over 240 Minutes Intravenous 3 times per day 06/07/14 0930 06/12/14 1614   06/03/14 1615  gentamicin (GARAMYCIN) IVPB 100 mg  Status:  Discontinued    Comments:  Please make hang time appropriate with when pre-op abx was given   100 mg 200 mL/hr over 30 Minutes Intravenous  Once 06/03/14 1611 06/03/14 1616   06/03/14 1615  clindamycin (CLEOCIN) IVPB 900 mg  Status:  Discontinued     900 mg 100 mL/hr over 30 Minutes Intravenous  Once 06/03/14 1611 06/03/14 1617   06/03/14 0900  gentamicin (GARAMYCIN) IVPB 100 mg     100 mg 200 mL/hr over 30 Minutes Intravenous To [facility]  06/02/14 1111 06/03/14 1130   06/03/14 0900  clindamycin (CLEOCIN) IVPB 900 mg     900 mg 100 mL/hr over 30 Minutes Intravenous To [facility] 06/02/14 1208 06/03/14 1130   06/02/14 1115  clindamycin (CLEOCIN) IVPB 900 mg  Status:  Discontinued     900 mg 100 mL/hr over 30 Minutes Intravenous On call to O.R. 06/02/14 1111 06/02/14 1206      Assessment/Plan: s/p Procedure(s): PARTIAL COLECTOMY (N/A) COLOSTOMY (N/A) POD 12 s/p ex lap with partial colectomy and colostomy --> Dr. Luisa Hart -cont PT -WOC following patient for new colostomy - BID Wet to dry dressing changes -encouraging pt to drink glucerna Leukocytosis -CT of abdomen 6/4 without surgical complications to explain leukocytosis. WBC is starting to trend down. No fever.wound culture negative Dispo-OK to proceed with placement/home  Hannah [name]. Hannah Campanile, MD, FACS General, Bariatric, & Minimally Invasive Surgery [facility], [place]   LOS: 17 days    Atilano Ina 06/15/2014

## 2014-06-15 NOTE — Progress Notes (Signed)
ANTICOAGULATION CONSULT NOTE - Follow Up Consult  Pharmacy Consult for Heparin + Coumadin Indication: DVT  Allergies  Allergen Reactions  . Augmentin [Amoxicillin-Pot Clavulanate] Other (See Comments)    Severe vaginal itching  . Tranxene [Clorazepate] Itching    Patient Measurements: Height: 5\' 7"  (170.2 cm) Weight: 216 lb 14.9 oz (98.4 kg) IBW/kg (Calculated) : 61.6  Heparin Dosing Weight: 81.5kg  Vital Signs: Temp: 99.6 F (37.6 C) (06/12 0453) Temp Source: Oral (06/12 0453) BP: 119/47 mmHg (06/12 0453) Pulse Rate: 99 (06/12 0453)  Labs:  Recent Labs  06/13/14 0456  06/14/14 0420 06/14/14 1439 06/15/14 0600  HGB 9.1*  --  9.0*  --  9.0*  HCT 28.0*  --  27.8*  --  28.8*  PLT 245  --  269  --  282  LABPROT 16.7*  --  18.7*  --  23.1*  INR 1.34  --  1.55*  --  2.07*  HEPARINUNFRC 0.76*  < > <0.10* 0.40 0.36  CREATININE  --   --   --   --  0.83  < > = values in this interval not displayed.  Estimated Creatinine Clearance: 72.7 mL/min (by C-G formula based on Cr of 0.83).   Assessment: 74yo female with hx of DVT & PE is currently on therapeutic heparin, bridge to therapeutic INR.  INR now therapeutic this morning.  CBC stable, no bleeding noted.  PTA Coumadin: 2.5mg  Tues and Sat, 5mg  AOD INR therapeutic on admit, 2.9  Goal of Therapy:  INR 2 - 3 Heparin level 0.3-0.7 units/ml Monitor platelets by anticoagulation protocol: Yes   Plan:  -Stop heparin gtt -Warfarin 2.5mg  x1 -Monitor INR, CBC, s/sx bleeding  Waynette Buttery, PharmD Clinical Pharmacy Resident Pager: [phone] 06/15/2014 7:25 AM

## 2014-06-15 NOTE — Discharge Summary (Signed)
Physician Discharge Summary  Hannah [name] WUJ:[id number] DOB: 09-07-1940 DOA: 05/29/2014  PCP: [name]., DO  Admit date: 05/29/2014 Discharge date: 06/16/14 Recommendations for Outpatient Follow-up:  1. Pt will need to follow up with PCP in 2 weeks post discharge 2. Please obtain BMP and CBC in one week 3. Please check INR on 06/18/14 and adjust Coumadin dose accordingly 4. As the patient is on concomitant ciprofloxacin, please monitor INR closely for the next 2 weeks 5. Please continue twice a day wet to dry dressing changes on the patient's abdominal wound 6. Follow up with Surgery, Dr. Luisa Hart, on  07/01/2014 @210PM   Discharge Diagnoses:  Sigmoid colon stricture/postoperative ileus She is status post partial colectomy and colostomy. General surgery is following. Initially seen by GI and underwent flexible sigmoidoscopy which showed sigmoid colon stricture. Subsequently Surgery was consulted and the patient underwent surgery.  -on 06/03/2014--expiratory laparotomy with partial colectomy and colostomy -General surgery is following and managing. CT scan done postoperatively due to fevers and leukocytosis . It suggested ileus which ultimate improved with conservative management. Patient has done well. Initially placed on TPN after surgery, but diet was advanced by general surgery. -weaned off TPN -continue soft diet--medications changed to oral formulation -Increase activity -OOB tid  Dehydration/Acute renal failure/hyponatremia/FEN -Sodium level is improved/stable with infusion of normal saline/TPN.  -Creatinine has improved.  -weaned off TPN -Tolerating soft diet although not eating much -06/13/2014--start calorie count per surgery--they ultimately felt pt was medically stable to d/c to SNF -Serum creatinine 0.8 on the day of discharge  Low Grade Fever/Leukocytosis/Possible wound infection -Patient was started on Zosyn on 6/4 for possible surgical wound infection.  . -Suspect low-grade fevers may be due to continued atelectasis -Encouraged and incentive spirometry -Increase activity -no evidence for any infection at this time based on extensive testing including chest x-ray, UA, blood cultures, lactic acid, CT of the abdomen.  -UA is unremarkable for infection.  -Chest x-ray shows only atelectasis. CT scan did not show any intra-abdominal infection. -06/07/14--Blood cultures are negative so far. Lactic acid is normal.  -06/07/14 Stool for C. difficile was negative.  -06/12/14--The patient is afebrile hemodynamically stable--discontinue Zosyn and monitor off antibiotics -06/12/14--repeat Cdiff pcr--neg -06/15/14--WBC improving, afebrile and hemodynamically stable >72 hours off antibiotics;  -06/16/2014-I spoke with the surgical service--they recommended ciprofloxacin 500 mg for 14 days for her surgical wound infection (grew Pseudomonas);  they will arrange follow-up for the patient  Sinus tachycardia -Secondary to acute medical condition -Overall improved -Patient is asymptomatic  History of pulmonary embolism on anticoagulation with warfarin She was diagnosed with a submassive pulmonary embolism in July 2014. She actually had a PEA arrest and required thrombolytics. -06/10/2014--warfarin restarted -Continued heparin bridge for warfarin when pt was stable after surgery -Patient has IVC filter -06/15/14--INR 2.14 -Patient will be discharged with warfarin 5 mg every day except for Tuesday and Thursday when she will take 2.5 mg -Please check INR on 06/18/2014 and adjust Coumadin accordingly -As the surgical service once the patient to remain on ciprofloxacin, please monitor INR closely for the next 2 weeks and adjust the patient's Coumadin accordingly Hypothyroidism -Changed IV to by mouth levothyroxine  History of essential Hypertension PRN hydralazine and intravenous beta blocker. -continue metoprolol tartrate 25mg  bid  Diabetes mellitus with  neurological manifestations, controlled -change SSI to q ac and hs -04/09/2014 hemoglobin A1c 7.3 -Hold metformin and glipizide during the hospitalization, but can be resumed at discharge -Restart metformin and glipizide after discharge  Hypokalemia Repleted. Continue to monitor.  Normocytic anemia Some drop noted in Hemoglobin. No overt bleeding. Continue to monitor.  Discharge Condition: stable  Disposition: SNF Follow-up Information    Follow up with Harriette Bouillon A., MD On 07/01/2014.   Specialty:  General Surgery   Why:  arrive by 1:50PM for a 2:10PM post op check with surgeon.    Contact information:   [address] [place] [address] [phone]     SNF  Diet:soft Wt Readings from Last 3 Encounters:  06/10/14 98.4 kg (216 lb 14.9 oz)  05/25/14 94.348 kg (208 lb)  04/07/14 94.711 kg (208 lb 12.8 oz)    History of present illness:  74 yo female w/ history DM, diverticulosis, DVT/PE on chronic Coumadin, HTN, and dyslipidemia. Presented w/ intractable N/V in setting of recent dx colonic stricture. Pt had presented to ER on multiple occasions for abdominal pain that has progressively worsened and has become assoc with N/V. Sx's began one week ago; CT abdomen 5/20 revealed stricture at descending and sigmoid colon. Plan was for pt to check w/ PCP re OK to hold Coumadin for colonoscopy then proceed as OP. On 5/21 INR was 5.49 so she was instructed to hold her Coumadin. Unfortunately her sx's worsened and she came to ER. On colonoscopy, she was found to have stricture so surgery was consulted.She underwent exploratory laparotomy with partial colectomy and colostomy on May 31. Subsequently, patient started developing low-grade fever and her WBC started increasing. CT abdomen was unremarkable for infection. All other infectious workup was negative. She was started on Zosyn 06/07/2014, but this was discontinued on 06/12/2014 and the patient was observed clinically.  The patient remained hemodynamically stable and afebrile after discontinuation of all antibiotics. After no clear source of infection was found, and the patient remained hemodynamically stable, her antibiotics were discontinued. However, surgery was concern about possible wound infection and recommended starting ciprofloxacin for 14 additional days at the time of discharge. Please monitor INR closely as the patient will be taking concomitant warfarin. Now she is tolerating a soft diet  Consultants: General Surgery GI--Dr. Elnoria Howard  Discharge Exam: Filed Vitals:   06/16/14 0529  BP: 127/60  Pulse: 94  Temp: 98.3 F (36.8 C)  Resp: 18   Filed Vitals:   06/15/14 0926 06/15/14 1311 06/15/14 2151 06/16/14 0529  BP: 120/52 131/50 130/55 127/60  Pulse: 89 90 89 94  Temp:  98.6 F (37 C) 98 F (36.7 C) 98.3 F (36.8 C)  TempSrc:   Oral Oral  Resp:  Height:      Weight:      SpO2:  100% 100% 98%   General: A&O x 3, NAD, pleasant, cooperative Cardiovascular: RRR, no rub, no gallop, no S3 Respiratory: Diminished breath sounds at the bases, but clear to auscultation Abdomen:soft, nontender, nondistended, positive bowel sounds; midline wound with great granulation. There is scant amount of yellow slough. No odor, no purulent drainage. Stool noted in the patient's ostomy Extremities: No edema, No lymphangitis, no petechiae  Discharge Instructions      Discharge Instructions    Diet - low sodium heart healthy    Complete by:  As directed      Increase activity slowly    Complete by:  As directed             Medication List    TAKE these medications        ciprofloxacin 500 MG tablet  Commonly known as:  CIPRO  Take 1 tablet (  500 mg total) by mouth 2 (two) times daily.     feeding supplement (GLUCERNA SHAKE) Liqd  Take 237 mLs by mouth 3 (three) times daily between meals.     feeding supplement (PRO-STAT SUGAR FREE 64) Liqd  Take 30 mLs by mouth 2 (two) times  daily.     glipiZIDE 5 MG tablet  Commonly known as:  GLUCOTROL  Take 1 tablet (5 mg total) by mouth daily before breakfast.     glucose blood test strip  Commonly known as:  ONETOUCH VERIO  Test daily.     levothyroxine 25 MCG tablet  Commonly known as:  SYNTHROID, LEVOTHROID  Take 1 tablet by mouth  daily before breakfast     metFORMIN 1000 MG tablet  Commonly known as:  GLUCOPHAGE  1000mg  (1 tablet) with breakfast and 1000mg  (1 tablet) with dinner.     metoprolol tartrate 25 MG tablet  Commonly known as:  LOPRESSOR  Take 1 tablet (25 mg total) by mouth 2 (two) times daily.     ondansetron 4 MG disintegrating tablet  Commonly known as:  ZOFRAN ODT  Take 1 tablet (4 mg total) by mouth every 8 (eight) hours as needed for nausea or vomiting.     oxyCODONE-acetaminophen 5-325 MG per tablet  Commonly known as:  PERCOCET/ROXICET  Take 1 tablet by mouth every 6 (six) hours as needed for moderate pain.     simvastatin 20 MG tablet  Commonly known as:  ZOCOR  Take 1 tablet (20 mg total) by mouth at bedtime.     vitamin C 500 MG tablet  Commonly known as:  ASCORBIC ACID  Take 500 mg by mouth daily.     warfarin 5 MG tablet  Commonly known as:  COUMADIN  Take 2.5-5 mg by mouth See admin instructions. Take 1 tablet all days except on Tues and Sat take 1/2 tablet         The results of significant diagnostics from this hospitalization (including imaging, microbiology, ancillary and laboratory) are listed below for reference.    Significant Diagnostic Studies: Ct Abdomen Pelvis W Contrast  06/07/2014   CLINICAL DATA:  Generalized abdominal pain. Worsening leukocytosis. Postop from partial colectomy with colostomy.  EXAM: CT ABDOMEN AND PELVIS WITH CONTRAST  TECHNIQUE: Multidetector CT imaging of the abdomen and pelvis was performed using the standard protocol following bolus administration of intravenous contrast.  CONTRAST:  OMNIPAQUE IOHEXOL 300 MG/ML  SOLN  COMPARISON:   05/23/2014  FINDINGS: Lower Chest: Increased bilateral lower lobe subsegmental atelectasis since prior exam.  Hepatobiliary: No masses or other significant abnormality identified. Gallbladder is unremarkable.  Pancreas: No mass, inflammatory changes, or other significant abnormality identified.  Spleen:  Within normal limits in size and appearance.  Adrenals:  No masses identified.  Kidneys/Urinary Tract:  No evidence of masses or hydronephrosis.  Stomach/Bowel/Peritoneum: Postop changes are seen from sigmoid colon resection and left lower quadrant colostomy. Decreased colonic dilatation is seen since previous study. Mild dilatation of small bowel loops is seen without evidence of focal transition point, likely due to postop ileus. No evidence of abscess or free fluid.  Vascular/Lymphatic: No pathologically enlarged lymph nodes identified. IVC filter remains in place. No other significant abnormality visualized.  Reproductive: Prior hysterectomy noted. Adnexal regions are unremarkable in appearance.  Other: Left hip prosthesis results in beam hardening artifact through the inferior aspect of the pelvis.  Musculoskeletal:  No suspicious bone lesions identified.  IMPRESSION: Expected postop changes from sigmoid colon resection with  left lower quadrant colostomy. Decreased colonic dilatation, with mild postop ileus pattern.  No evidence of abscess or free fluid.  Increased bibasilar atelectasis.   Electronically Signed   By: Myles Rosenthal M.D.   On: 06/07/2014 20:36   Dg Chest Port 1 View  06/06/2014   CLINICAL DATA:  Fever, history of DVT, pulmonary embolism, hypertension, diabetes  EXAM: PORTABLE CHEST - 1 VIEW  COMPARISON:  Portable exam 0855 hours compared 08/29/2012  FINDINGS: RIGHT arm PICC line tip projects over cavoatrial junction.  Enlargement of cardiac silhouette with pulmonary vascular congestion.  Atherosclerotic calcification aorta.  Chronic elevation RIGHT diaphragm.  Decreased lung volumes with RIGHT  basilar atelectasis.  No definite infiltrate, pleural effusion or pneumothorax.  IMPRESSION: Decreased lung volumes with RIGHT basilar atelectasis.  Enlargement of cardiac silhouette with pulmonary vascular congestion.   Electronically Signed   By: Ulyses Southward M.D.   On: 06/06/2014 09:07   Dg Abd 2 Views  05/27/2014   CLINICAL DATA:  Generalized abdominal pain and vomiting for 5 days.  EXAM: ABDOMEN - 2 VIEW  COMPARISON:  CT 05/23/2014  FINDINGS: There is gaseous distention of the colon. No small bowel dilatation to suggest small bowel obstruction. No free air, organomegaly or suspicious calcification. IVC filter in place. Prior left hip replacement.  IMPRESSION: Mild gaseous distention of the colon, possibly mild ileus.   Electronically Signed   By: Charlett Nose M.D.   On: 05/27/2014 16:07   Ct Cta Abd/pel W/cm &/or W/o Cm  05/23/2014   CLINICAL DATA:  Diffuse abdominal pain.  EXAM: CTA ABDOMEN AND PELVIS WITH CONTRAST  TECHNIQUE: Multidetector CT imaging of the abdomen and pelvis was performed using the standard protocol during bolus administration of intravenous contrast. Multiplanar reconstructed images and MIPs were obtained and reviewed to evaluate the vascular anatomy.  CONTRAST:  80mL OMNIPAQUE IOHEXOL 350 MG/ML SOLN  COMPARISON:  CT scan dated 07/15/2012  FINDINGS: There is minimal calcification in the abdominal aorta and iliac arteries. Superior mesenteric, inferior mesenteric and celiac arteries are widely patent. There are 2 widely patent artery to each each kidney.  The superior mesenteric vein and portal vein are normal.  There is a small amount of ascites anterior to the right lobe of the liver just below the diaphragm. Liver is otherwise normal. Biliary tree, spleen, pancreas, adrenal glands, and kidneys demonstrate no significant abnormality. There is slight hyperplasia of both adrenal glands, unchanged.  There is diffuse distention of the colon to the proximal sigmoid region. The colon is  filled with air and fluid. No dilated small bowel. Terminal ileum and appendix are normal.  There is a transition point at the junction of the descending colon with the sigmoid. There is a small amount of free fluid in the left pericolic gutter adjacent to this transition point and also small amount of free fluid in the pelvis. There are multiple diverticula in the sigmoid colon below the transition point.  The patient has had diverticulitis in this area in the past. This may represent a diverticular stricture although I cannot exclude a mucosal mass at this transition point.  Filter in the IVC.  No acute osseous abnormalities.  Rectum appears normal. Bladder is normal. Uterus appears to have been removed.  Review of the MIP images confirms the above findings.  IMPRESSION: 1. No significant vascular abnormality of the abdomen. No arterial stenoses. Widely patent SMA and SMV. 2. Diffuse dilatation of the fluid-filled colon to the junction of the descending and sigmoid  portions. The possibility of a stricture secondary to prior diverticulitis or mucosal mass should be considered. 3. Small amount of ascites in the abdomen.   Electronically Signed   By: Francene Boyers M.D.   On: 05/23/2014 13:04     Microbiology: Recent Results (from the past 240 hour(s))  Culture, blood (routine x 2)     Status: None   Collection Time: 06/07/14  9:50 AM  Result Value Ref Range Status   Specimen Description BLOOD LEFT WRIST  Final   Special Requests BOTTLES DRAWN AEROBIC ONLY 8CC  Final   Culture   Final    NO GROWTH 5 DAYS Performed at [facility]    Report Status 06/13/2014 FINAL  Final  Culture, blood (routine x 2)     Status: None   Collection Time: 06/07/14  9:55 AM  Result Value Ref Range Status   Specimen Description BLOOD LEFT HAND  Final   Special Requests BOTTLES DRAWN AEROBIC ONLY 10CC  Final   Culture   Final    NO GROWTH 5 DAYS Performed at [facility]    Report Status 06/13/2014  FINAL  Final  Clostridium Difficile by PCR     Status: None   Collection Time: 06/07/14  1:21 PM  Result Value Ref Range Status   C difficile by pcr NEGATIVE NEGATIVE Final  Clostridium Difficile by PCR (not at [facility])     Status: None   Collection Time: 06/13/14  9:53 AM  Result Value Ref Range Status   C difficile by pcr NEGATIVE NEGATIVE Final  Wound culture     Status: None   Collection Time: 06/13/14  9:54 AM  Result Value Ref Range Status   Specimen Description WOUND ABDOMEN  Final   Special Requests OPEN MIDLINE WOUND  Final   Gram Stain   Final    FEW WBC PRESENT,BOTH PMN AND MONONUCLEAR NO SQUAMOUS EPITHELIAL CELLS SEEN NO ORGANISMS SEEN Performed at [facility]    Culture   Final    FEW PSEUDOMONAS AERUGINOSA Performed at [facility]    Report Status 06/16/2014 FINAL  Final   Organism ID, Bacteria PSEUDOMONAS AERUGINOSA  Final      Susceptibility   Pseudomonas aeruginosa - MIC*    CEFEPIME 32 RESISTANT Resistant     CEFTAZIDIME >=64 RESISTANT Resistant     CIPROFLOXACIN <=0.25 SENSITIVE Sensitive     GENTAMICIN <=1 SENSITIVE Sensitive     IMIPENEM 1 SENSITIVE Sensitive     TOBRAMYCIN <=1 SENSITIVE Sensitive     * FEW PSEUDOMONAS AERUGINOSA     Labs: Basic Metabolic Panel:  Recent Labs Lab 06/10/14 0508 06/11/14 0452 06/12/14 0522 06/15/14 0600 06/16/14 0529  NA 130* 130* 133* 132* 136  K 4.0 3.9 4.2 3.8 3.9  CL 99* 100* 102 102 105  CO2 26 25 23 22 23   GLUCOSE 114* 116* 109* 112* 116*  BUN 24* 20 14 8 6   CREATININE 0.97 0.91 0.91 0.83 0.86  CALCIUM 8.1* 8.1* 8.2* 8.2* 8.3*  MG 1.9  --   --   --   --    Liver Function Tests: No results for input(s): AST, ALT, ALKPHOS, BILITOT, PROT, ALBUMIN in the last 168 hours. No results for input(s): LIPASE, AMYLASE in the last 168 hours. No results for input(s): AMMONIA in the last 168 hours. CBC:  Recent Labs Lab 06/11/14 0452 06/12/14 0522 06/13/14 0456 06/14/14 0420 06/15/14 0600    WBC 13.7* 15.5* 12.5* 11.4* 11.8*  NEUTROABS  --  11.8*  --   --   --   HGB 8.9* 9.4* 9.1* 9.0* 9.0*  HCT 27.0* 28.3* 28.0* 27.8* 28.8*  MCV 84.4 84.0 84.1 84.5 85.0  PLT 211 226 245 269 282   Cardiac Enzymes: No results for input(s): CKTOTAL, CKMB, CKMBINDEX, TROPONINI in the last 168 hours. BNP: Invalid input(s): POCBNP CBG:  Recent Labs Lab 06/14/14 2149 06/15/14 0807 06/15/14 1140 06/15/14 1631 06/15/14 2201  GLUCAP 114* 92 92 109* 135*    Time coordinating discharge:  Greater than 30 minutes  Signed:  Durrell Barajas, DO [facility] Pager: [phone] 06/16/2014, 10:27 AM

## 2014-06-16 DIAGNOSIS — R5381 Other malaise: Secondary | ICD-10-CM | POA: Diagnosis not present

## 2014-06-16 DIAGNOSIS — E119 Type 2 diabetes mellitus without complications: Secondary | ICD-10-CM | POA: Diagnosis not present

## 2014-06-16 DIAGNOSIS — L98493 Non-pressure chronic ulcer of skin of other sites with necrosis of muscle: Secondary | ICD-10-CM | POA: Diagnosis not present

## 2014-06-16 DIAGNOSIS — K560 Paralytic ileus: Secondary | ICD-10-CM | POA: Diagnosis not present

## 2014-06-16 DIAGNOSIS — E1140 Type 2 diabetes mellitus with diabetic neuropathy, unspecified: Secondary | ICD-10-CM | POA: Diagnosis not present

## 2014-06-16 DIAGNOSIS — R262 Difficulty in walking, not elsewhere classified: Secondary | ICD-10-CM | POA: Diagnosis not present

## 2014-06-16 DIAGNOSIS — Z86711 Personal history of pulmonary embolism: Secondary | ICD-10-CM | POA: Diagnosis not present

## 2014-06-16 DIAGNOSIS — K5669 Other intestinal obstruction: Secondary | ICD-10-CM | POA: Diagnosis not present

## 2014-06-16 DIAGNOSIS — R000 Tachycardia, unspecified: Secondary | ICD-10-CM | POA: Diagnosis not present

## 2014-06-16 DIAGNOSIS — I10 Essential (primary) hypertension: Secondary | ICD-10-CM | POA: Diagnosis not present

## 2014-06-16 DIAGNOSIS — N179 Acute kidney failure, unspecified: Secondary | ICD-10-CM | POA: Diagnosis not present

## 2014-06-16 DIAGNOSIS — R531 Weakness: Secondary | ICD-10-CM | POA: Diagnosis not present

## 2014-06-16 DIAGNOSIS — D649 Anemia, unspecified: Secondary | ICD-10-CM | POA: Diagnosis not present

## 2014-06-16 DIAGNOSIS — K6389 Other specified diseases of intestine: Secondary | ICD-10-CM | POA: Diagnosis not present

## 2014-06-16 DIAGNOSIS — E039 Hypothyroidism, unspecified: Secondary | ICD-10-CM | POA: Diagnosis not present

## 2014-06-16 DIAGNOSIS — B373 Candidiasis of vulva and vagina: Secondary | ICD-10-CM | POA: Diagnosis not present

## 2014-06-16 DIAGNOSIS — L259 Unspecified contact dermatitis, unspecified cause: Secondary | ICD-10-CM | POA: Diagnosis not present

## 2014-06-16 DIAGNOSIS — R110 Nausea: Secondary | ICD-10-CM | POA: Diagnosis not present

## 2014-06-16 DIAGNOSIS — M6281 Muscle weakness (generalized): Secondary | ICD-10-CM | POA: Diagnosis not present

## 2014-06-16 DIAGNOSIS — F4522 Body dysmorphic disorder: Secondary | ICD-10-CM | POA: Diagnosis not present

## 2014-06-16 DIAGNOSIS — E785 Hyperlipidemia, unspecified: Secondary | ICD-10-CM | POA: Diagnosis not present

## 2014-06-16 DIAGNOSIS — L304 Erythema intertrigo: Secondary | ICD-10-CM | POA: Diagnosis not present

## 2014-06-16 DIAGNOSIS — E1149 Type 2 diabetes mellitus with other diabetic neurological complication: Secondary | ICD-10-CM | POA: Diagnosis not present

## 2014-06-16 DIAGNOSIS — E162 Hypoglycemia, unspecified: Secondary | ICD-10-CM | POA: Diagnosis not present

## 2014-06-16 DIAGNOSIS — Z1389 Encounter for screening for other disorder: Secondary | ICD-10-CM | POA: Diagnosis not present

## 2014-06-16 LAB — GLUCOSE, CAPILLARY
Glucose-Capillary: 121 mg/dL — ABNORMAL HIGH (ref 65–99)
Glucose-Capillary: 117 mg/dL — ABNORMAL HIGH (ref 65–99)
Glucose-Capillary: 121 mg/dL — ABNORMAL HIGH (ref 65–99)

## 2014-06-16 LAB — WOUND CULTURE

## 2014-06-16 LAB — BASIC METABOLIC PANEL
Anion gap: 8 (ref 5–15)
BUN: 6 mg/dL (ref 6–20)
CALCIUM: 8.3 mg/dL — AB (ref 8.9–10.3)
CHLORIDE: 105 mmol/L (ref 101–111)
CO2: 23 mmol/L (ref 22–32)
CREATININE: 0.86 mg/dL (ref 0.44–1.00)
GFR calc Af Amer: >60 mL/min (ref >60)
GFR calc non Af Amer: >60 mL/min (ref >60)
GLUCOSE: 116 mg/dL — AB (ref 65–99)
Potassium: 3.9 mmol/L (ref 3.5–5.1)
Sodium: 136 mmol/L (ref 135–145)

## 2014-06-16 LAB — PROTIME-INR
INR: 2.14 — ABNORMAL HIGH (ref 0.00–1.49)
Prothrombin Time: 23.7 seconds — ABNORMAL HIGH (ref 11.6–15.2)

## 2014-06-16 MED ORDER — CIPROFLOXACIN HCL 500 MG PO TABS
500.0000 mg | ORAL_TABLET | Freq: Two times a day (BID) | ORAL | Status: DC
  Administered 2014-06-16: 500 mg via ORAL
  Filled 2014-06-16 (×3): qty 1

## 2014-06-16 MED ORDER — CIPROFLOXACIN HCL 500 MG PO TABS: 500.0000 mg | ORAL_TABLET | Freq: Two times a day (BID) | ORAL | Status: DC

## 2014-06-16 MED ORDER — WARFARIN SODIUM 5 MG PO TABS
5.0000 mg | ORAL_TABLET | ORAL | Status: AC
  Administered 2014-06-16: 5 mg via ORAL
  Filled 2014-06-16: qty 1

## 2014-06-16 MED ORDER — PRO-STAT SUGAR FREE PO LIQD: 30.0000 mL | Freq: Two times a day (BID) | ORAL | Status: DC

## 2014-06-16 MED ORDER — OXYCODONE-ACETAMINOPHEN 5-325 MG PO TABS: 1.0000 | ORAL_TABLET | Freq: Four times a day (QID) | ORAL | Status: DC | PRN

## 2014-06-16 MED ORDER — WARFARIN SODIUM 2.5 MG PO TABS: 2.5000 mg | ORAL_TABLET | ORAL | Status: DC

## 2014-06-16 NOTE — Progress Notes (Signed)
Calorie Count Note  48 hour calorie count ordered and completed. Chart reviewed. Potential d/c to SNF today if medically stable per MD notes. Pt has been transitioned to PO meds.   Diet: Soft Supplements: Glucerna Shake po TID, each supplement provides 220 kcal and 10 grams of protein  06/13/14 Breakfast: 40 kcals, 2 grams protein (approximately 15% meal completion) Lunch: 53 kcals, 2 grams protein (approximately 20% meal completion) Dinner: 74 kcals, 2 grams protein Supplements: 40 ml of Glucerna shake (37 kcals and 2 grams protein   Calorie count reveals that pt is consuming very little of any one food item, typically eating a few bites of each item (5-20% of each individual food item). Also noted calorie count data was in complete and only had intake data for 06/13/14.   Meal completion records reveal that pt consumed 50% of breakfast, 50% of lunch, and 75% of dinner on 06/14/14 and consumed 50% of 2 Glucerna shakes, per chart review (approximately 1270 kcals and 63 grams protein).   Total intake: 738 kcal (39% of minimum estimated needs)  35 protein (37% of minimum estimated needs)  Nutrition Dx: Inadequate oral intake related to altered GI function as evidenced by other (see comment) (TPN weaning, just advanced to soft diet); ongoing  Goal: Patient will meet greater than or equal to 90% of their needs; unmet  Intervention:  -30 ml prostat BID -Continue Glucerna Shake po TID, each supplement provides 220 kcal and 10 grams of protein -Continue to encourage PO and supplement intake  Kathleene Bergemann A. Mayford Knife, RD, LDN, CDE Pager: [phone] After hours Pager: [phone]

## 2014-06-16 NOTE — Progress Notes (Signed)
Patient ID: Hannah [name], female   DOB: August 17, 1940, 74 y.o.   MRN: [id number]     [facility]      [address] [place], [place] [address]    Phone: [phone] FAX: [phone]     Subjective: Afebrile.  Wound with pseudomonas sensitive to cipro.  Denies n/v.  Appetite is fair. Didn't get OOB yesterday.   Objective:  Vital signs:  Filed Vitals:   06/15/14 0926 06/15/14 1311 06/15/14 2151 06/16/14 0529  BP: 120/52 131/50 130/55 127/60  Pulse: 89 90 89 94  Temp:  98.6 F (37 C) 98 F (36.7 C) 98.3 F (36.8 C)  TempSrc:   Oral Oral  Resp:  '18 18 18  ' Height:      Weight:      SpO2:  100% 100% 98%    Last BM Date: 06/15/14  Intake/Output   Yesterday:  06/12 0701 - 06/13 0700 In: 600 [P.O.:600] Out: 1200 [Urine:1200] This shift:     I/O last 3 completed shifts: In: 1061.8 [P.O.:840; I.V.:221.8] Out: 1500 [Urine:1500]    Physical Exam: General: Pt awake/alert/oriented x4 in no acute distress  Abdomen: Soft.  Nondistended.   Mildly tender at incisions only.  Wound with greenish output, minimal bleeding and malodorous. Stoma is pink with dark areas.  No evidence of peritonitis.  No incarcerated hernias.    Problem List:   Principal Problem:   Colonic stricture Active Problems:   Long term current use of anticoagulant therapy   Hypothyroid   Hypertension   Diabetes mellitus with neurological manifestations, controlled   History of pulmonary embolism   Dehydration   Acute renal failure   Leukocytosis   Normocytic anemia   Fever   Colon stricture    Results:   Labs: Results for orders placed or performed during the hospital encounter of 05/29/14 (from the past 48 hour(s))  Glucose, capillary     Status: Abnormal   Collection Time: 06/14/14 11:42 AM  Result Value Ref Range   Glucose-Capillary 106 (H) 65 - 99 mg/dL  Heparin level (unfractionated)     Status: None   Collection Time: 06/14/14  2:39 PM   Result Value Ref Range   Heparin Unfractionated 0.40 0.30 - 0.70 IU/mL    Comment:        IF HEPARIN RESULTS ARE BELOW EXPECTED VALUES, AND PATIENT DOSAGE HAS BEEN CONFIRMED, SUGGEST FOLLOW UP TESTING OF ANTITHROMBIN III LEVELS.   Glucose, capillary     Status: Abnormal   Collection Time: 06/14/14  4:34 PM  Result Value Ref Range   Glucose-Capillary 121 (H) 65 - 99 mg/dL  Glucose, capillary     Status: Abnormal   Collection Time: 06/14/14  9:49 PM  Result Value Ref Range   Glucose-Capillary 114 (H) 65 - 99 mg/dL  Heparin level (unfractionated)     Status: None   Collection Time: 06/15/14  6:00 AM  Result Value Ref Range   Heparin Unfractionated 0.36 0.30 - 0.70 IU/mL    Comment:        IF HEPARIN RESULTS ARE BELOW EXPECTED VALUES, AND PATIENT DOSAGE HAS BEEN CONFIRMED, SUGGEST FOLLOW UP TESTING OF ANTITHROMBIN III LEVELS.   CBC     Status: Abnormal   Collection Time: 06/15/14  6:00 AM  Result Value Ref Range   WBC 11.8 (H) 4.0 - 10.5 K/uL   RBC 3.39 (L) 3.87 - 5.11 MIL/uL   Hemoglobin 9.0 (L) 12.0 - 15.0 g/dL  HCT 28.8 (L) 36.0 - 46.0 %   MCV 85.0 78.0 - 100.0 fL   MCH 26.5 26.0 - 34.0 pg   MCHC 31.3 30.0 - 36.0 g/dL   RDW 14.8 11.5 - 15.5 %   Platelets 282 150 - 400 K/uL  Basic metabolic panel     Status: Abnormal   Collection Time: 06/15/14  6:00 AM  Result Value Ref Range   Sodium 132 (L) 135 - 145 mmol/L   Potassium 3.8 3.5 - 5.1 mmol/L   Chloride 102 101 - 111 mmol/L   CO2 22 22 - 32 mmol/L   Glucose, Bld 112 (H) 65 - 99 mg/dL   BUN 8 6 - 20 mg/dL   Creatinine, Ser 0.83 0.44 - 1.00 mg/dL   Calcium 8.2 (L) 8.9 - 10.3 mg/dL   GFR calc non Af Amer >60 >60 mL/min   GFR calc Af Amer >60 >60 mL/min    Comment: (NOTE) The eGFR has been calculated using the CKD EPI equation. This calculation has not been validated in all clinical situations. eGFR's persistently <60 mL/min signify possible Chronic Kidney Disease.    Anion gap 8 5 - 15  Protime-INR      Status: Abnormal   Collection Time: 06/15/14  6:00 AM  Result Value Ref Range   Prothrombin Time 23.1 (H) 11.6 - 15.2 seconds   INR 2.07 (H) 0.00 - 1.49  Glucose, capillary     Status: None   Collection Time: 06/15/14  8:07 AM  Result Value Ref Range   Glucose-Capillary 92 65 - 99 mg/dL  Glucose, capillary     Status: None   Collection Time: 06/15/14 11:40 AM  Result Value Ref Range   Glucose-Capillary 92 65 - 99 mg/dL  Glucose, capillary     Status: Abnormal   Collection Time: 06/15/14  4:31 PM  Result Value Ref Range   Glucose-Capillary 109 (H) 65 - 99 mg/dL  Glucose, capillary     Status: Abnormal   Collection Time: 06/15/14 10:01 PM  Result Value Ref Range   Glucose-Capillary 135 (H) 65 - 99 mg/dL  Protime-INR     Status: Abnormal   Collection Time: 06/16/14  5:29 AM  Result Value Ref Range   Prothrombin Time 23.7 (H) 11.6 - 15.2 seconds   INR 2.14 (H) 0.00 - 3.41  Basic metabolic panel     Status: Abnormal   Collection Time: 06/16/14  5:29 AM  Result Value Ref Range   Sodium 136 135 - 145 mmol/L   Potassium 3.9 3.5 - 5.1 mmol/L   Chloride 105 101 - 111 mmol/L   CO2 23 22 - 32 mmol/L   Glucose, Bld 116 (H) 65 - 99 mg/dL   BUN 6 6 - 20 mg/dL   Creatinine, Ser 0.86 0.44 - 1.00 mg/dL   Calcium 8.3 (L) 8.9 - 10.3 mg/dL   GFR calc non Af Amer >60 >60 mL/min   GFR calc Af Amer >60 >60 mL/min    Comment: (NOTE) The eGFR has been calculated using the CKD EPI equation. This calculation has not been validated in all clinical situations. eGFR's persistently <60 mL/min signify possible Chronic Kidney Disease.    Anion gap 8 5 - 15    Imaging / Studies: No results found.  Medications / Allergies:  Scheduled Meds: . antiseptic oral rinse  7 mL Mouth Rinse q12n4p  . chlorhexidine  15 mL Mouth Rinse BID  . feeding supplement (GLUCERNA SHAKE)  237 mL Oral TID BM  .  feeding supplement (PRO-STAT SUGAR FREE 64)  30 mL Oral BID  . folic acid  1 mg Oral Daily  . insulin aspart   0-9 Units Subcutaneous TID WC  . levothyroxine  25 mcg Oral QAC breakfast  . metoprolol tartrate  25 mg Oral BID  . thiamine  100 mg Oral Daily  . warfarin  5 mg Oral Once per day on Sun Mon Wed Thu Fri   And  . [START ON 06/17/2014] warfarin  2.5 mg Oral Once per day on Tue Sat  . Warfarin - Pharmacist Dosing Inpatient   Does not apply q1800   Continuous Infusions: . lactated ringers 10 mL/hr at 06/03/14 1015   PRN Meds:.acetaminophen, hydrALAZINE, HYDROmorphone (DILAUDID) injection, HYDROmorphone (DILAUDID) injection, ondansetron (ZOFRAN) IV, promethazine, sodium chloride  Antibiotics: Anti-infectives    Start     Dose/Rate Route Frequency Ordered Stop   06/07/14 1000  piperacillin-tazobactam (ZOSYN) IVPB 3.375 g  Status:  Discontinued     3.375 g 12.5 mL/hr over 240 Minutes Intravenous 3 times per day 06/07/14 0930 06/12/14 1614   06/03/14 1615  gentamicin (GARAMYCIN) IVPB 100 mg  Status:  Discontinued    Comments:  Please make hang time appropriate with when pre-op abx was given   100 mg 200 mL/hr over 30 Minutes Intravenous  Once 06/03/14 1611 06/03/14 1616   06/03/14 1615  clindamycin (CLEOCIN) IVPB 900 mg  Status:  Discontinued     900 mg 100 mL/hr over 30 Minutes Intravenous  Once 06/03/14 1611 06/03/14 1617   06/03/14 0900  gentamicin (GARAMYCIN) IVPB 100 mg     100 mg 200 mL/hr over 30 Minutes Intravenous To [facility] 06/02/14 1111 06/03/14 1130   06/03/14 0900  clindamycin (CLEOCIN) IVPB 900 mg     900 mg 100 mL/hr over 30 Minutes Intravenous To [facility] 06/02/14 1208 06/03/14 1130   06/02/14 1115  clindamycin (CLEOCIN) IVPB 900 mg  Status:  Discontinued     900 mg 100 mL/hr over 30 Minutes Intravenous On call to O.R. 06/02/14 1111 06/02/14 1206       Assessment/Plan: POD 13 s/p ex lap with partial colectomy and colostomy --> Dr. Brantley Stage -WOC following patient for new colostomy -wound culture +pseudomonas, add cipro BID x14  days -mobilize -IS -BID wet to dry dressing changes  DVT prophylaxis -coumadin FEN-improved appetite, continue supplements  Dispo-stable for DC from surgical standpoint.  Follow up arranged.    Erby Pian, [facility] Pager [phone]) For consults and floor pages call [phone](7A-4:30P)  06/16/2014 10:19 AM

## 2014-06-16 NOTE — Progress Notes (Signed)
Utilization Review completed. Margrette Wynia RN BSN CM 

## 2014-06-16 NOTE — Progress Notes (Signed)
Physical Therapy Treatment Patient Details Name: Hannah [name] MRN: [id number] DOB: Jun 28, 1940 Today's Date: 06/16/2014    History of Present Illness Pt is a 74 year old female with a PMH of DVT s/p IVC filter, HTN, DM, and diverticulitis admitted for abdominal pain, nausea, vomiting, and sigmoid colon stricture. Pt underwent exploratory lap with colectomy and colostomy on 06/03/14    PT Comments    Pt progressing slowly but making progress. Noted depressed spirits due to decreased mobility. Encouraged pt to stay positive and work hard at rehab. Pt agreed.  Follow Up Recommendations  SNF     Equipment Recommendations  None recommended by PT    Recommendations for Other Services       Precautions / Restrictions Precautions Precautions: Fall Precaution Comments: colo Restrictions Weight Bearing Restrictions: No    Mobility  Bed Mobility Overal bed mobility: Needs Assistance Bed Mobility: Supine to Sit Rolling: Min assist Sidelying to sit: Min assist       General bed mobility comments: increased time, use of bedrail  Transfers Overall transfer level: Needs assistance Equipment used: Rolling walker (2 wheeled) Transfers: Sit to/from Stand Sit to Stand: Min assist         General transfer comment: increased time, used rocking forward/momentum to stand  Ambulation/Gait Ambulation/Gait assistance: Min assist Ambulation Distance (Feet): 60 Feet Assistive device: Rolling walker (2 wheeled) Gait Pattern/deviations: Step-through pattern;Decreased stride length Gait velocity: slow Gait velocity interpretation: Below normal speed for age/gender General Gait Details: no episodes of LOB, increased reliance on RW   Stairs            Wheelchair Mobility    Modified Rankin (Stroke Patients Only)       Balance                                    Cognition Arousal/Alertness: Awake/alert Behavior During Therapy: WFL for tasks  assessed/performed Overall Cognitive Status: Within Functional Limits for tasks assessed                      Exercises      General Comments        Pertinent Vitals/Pain Pain Assessment: No/denies pain    Home Living                      Prior Function            PT Goals (current goals can now be found in the care plan section) Acute Rehab PT Goals Patient Stated Goal: to be indep Progress towards PT goals: Progressing toward goals    Frequency  Min 2X/week    PT Plan Current plan remains appropriate;Discharge plan needs to be updated    Co-evaluation             End of Session Equipment Utilized During Treatment: Gait belt Activity Tolerance: Patient tolerated treatment well Patient left: in bed;with call bell/phone within reach     Time: 1512-1527 PT Time Calculation (min) (ACUTE ONLY): 15 min  Charges:  $Gait Training: 8-22 mins                    G Codes:      Marcene Brawn 06/16/2014, 4:11 PM  Lewis Shock, PT, DPT Pager #: [phone] Office #: [phone]

## 2014-06-16 NOTE — Consult Note (Addendum)
WOC ostomy consult note CCS following for assessment and plan of care to abd wound. Stoma type/location: Colostomy to LLQ from 5/31.  Stomal assessment/size: Stoma 80 % dark red and viable, 20% yellow, flush with skin level, approx 1 1/2 inches Peristomal assessment: Intact skin surrounding Output: 50cc liquid brown stool Ostomy pouching: 1pc. with barrier ring Education provided:  Current pouch was not convex and there was no barrier ring used as requested; it was leaking behind the barrier. Demonstrated application of barrier ring and one piece convex pouch.Pt watched and asked appropriate questions. She was able to open and close velcro to empty. Discussed pouching routines and ordering supplies. Educational materials left at bedside and supplies in room for staff nurse.  Pt plans to have further assistance with pouching activities at a SNF after discharge. She denies further questions at this time.  Enrolled patient in [facility] DC program: Yes Cammie Mcgee MSN, RN, [place], [place], [place] [phone]

## 2014-06-16 NOTE — Clinical Social Work Placement (Addendum)
   CLINICAL SOCIAL WORK PLACEMENT  NOTE  Date:  06/16/2014  Patient Details  Name: Hannah [name] MRN: [id number] Date of Birth: [birth year]  Clinical Social Work is seeking post-discharge placement for this patient at the Skilled  Nursing Facility level of care (*CSW will initial, date and re-position this form in  chart as items are completed):  Yes   Patient/family provided with [facility] Clinical Social Work Department's list of facilities offering this level of care within the geographic area requested by the patient (or if unable, by the patient's family).  Yes   Patient/family informed of their freedom to choose among providers that offer the needed level of care, that participate in Medicare, Medicaid or managed care program needed by the patient, have an available bed and are willing to accept the patient.  Yes   Patient/family informed of [facility]'s ownership interest in [facility] and [facility], as well as of the fact that they are under no obligation to receive care at these facilities.  PASRR submitted to EDS on       PASRR number received on       Existing PASRR number confirmed on 06/08/14     FL2 transmitted to all facilities in geographic area requested by pt/family on 06/08/14     FL2 transmitted to all facilities within larger geographic area on 06/08/14     Patient informed that his/her managed care company has contracts with or will negotiate with certain facilities, including the following:        Yes   Patient/family informed of bed offers received.  Patient chooses bed at [facility]     Physician recommends and patient chooses bed at      Patient to be transferred to [facility] on 06/16/14.  Patient to be transferred to facility by Ambulance     Patient family notified on 06/16/14 of transfer.  Name of family member notified:  [name]     PHYSICIAN       Additional Comment:  Per MD patient ready for DC to  [facility] RN, patient, patient's family, and facility notified of DC. RN given number for report. DC packet on chart. RN will call for EMS transport once PICC has been removed.  CSW signing off.  Roddie Mc MSW, [place], [place], [phone] _______________________________________________ Venita Lick, LCSW 06/16/2014, 4:04 PM

## 2014-06-16 NOTE — Progress Notes (Signed)
ANTICOAGULATION CONSULT NOTE - Follow Up Consult  Pharmacy Consult for Coumadin Indication: history PE, DVT  Allergies  Allergen Reactions  . Augmentin [Amoxicillin-Pot Clavulanate] Other (See Comments)    Severe vaginal itching  . Tranxene [Clorazepate] Itching    Patient Measurements: Height: 5\' 7"  (170.2 cm) Weight: 216 lb 14.9 oz (98.4 kg) IBW/kg (Calculated) : 61.6  Heparin Dosing Weight: 81.5kg  Vital Signs: Temp: 98.3 F (36.8 C) (06/13 0529) Temp Source: Oral (06/13 0529) BP: 127/60 mmHg (06/13 0529) Pulse Rate: 94 (06/13 0529)  Labs:  Recent Labs  06/14/14 0420 06/14/14 1439 06/15/14 0600 06/16/14 0529  HGB 9.0*  --  9.0*  --   HCT 27.8*  --  28.8*  --   PLT 269  --  282  --   LABPROT 18.7*  --  23.1* 23.7*  INR 1.55*  --  2.07* 2.14*  HEPARINUNFRC <0.10* 0.40 0.36  --   CREATININE  --   --  0.83 0.86    Estimated Creatinine Clearance: 70.2 mL/min (by C-G formula based on Cr of 0.86).   Assessment: 74yo female with hx of DVT & PE is currently therapeutic on warfarin.  Noted plans to d/c today.  Would recommend restarting home dose with close INR follow-up since INR was recently elevated on this regimen (when pt was NPO)  CBC stable, no bleeding noted.  PTA Warfarin: 5mg  daily except 2.5mg  on Tues and Sat  Goal of Therapy:  INR 2 - 3 Heparin level 0.3-0.7 units/ml Monitor platelets by anticoagulation protocol: Yes   Plan:  Warfarin 5mg  daily except 2.5mg  Tues/Sat Monitor INR, CBC, s/sx bleeding  [employer], Pharm.D., BCPS Clinical Pharmacist Pager [phone] 06/16/2014 9:58 AM

## 2014-06-18 DIAGNOSIS — R262 Difficulty in walking, not elsewhere classified: Secondary | ICD-10-CM | POA: Diagnosis not present

## 2014-06-18 DIAGNOSIS — R5381 Other malaise: Secondary | ICD-10-CM | POA: Diagnosis not present

## 2014-06-18 DIAGNOSIS — M6281 Muscle weakness (generalized): Secondary | ICD-10-CM | POA: Diagnosis not present

## 2014-06-19 DIAGNOSIS — E785 Hyperlipidemia, unspecified: Secondary | ICD-10-CM | POA: Diagnosis not present

## 2014-06-19 DIAGNOSIS — L259 Unspecified contact dermatitis, unspecified cause: Secondary | ICD-10-CM | POA: Diagnosis not present

## 2014-06-19 DIAGNOSIS — E119 Type 2 diabetes mellitus without complications: Secondary | ICD-10-CM | POA: Diagnosis not present

## 2014-06-19 DIAGNOSIS — K6389 Other specified diseases of intestine: Secondary | ICD-10-CM | POA: Diagnosis not present

## 2014-06-19 DIAGNOSIS — L98493 Non-pressure chronic ulcer of skin of other sites with necrosis of muscle: Secondary | ICD-10-CM | POA: Diagnosis not present

## 2014-06-19 DIAGNOSIS — I10 Essential (primary) hypertension: Secondary | ICD-10-CM | POA: Diagnosis not present

## 2014-06-23 DIAGNOSIS — L304 Erythema intertrigo: Secondary | ICD-10-CM | POA: Diagnosis not present

## 2014-06-23 DIAGNOSIS — B373 Candidiasis of vulva and vagina: Secondary | ICD-10-CM | POA: Diagnosis not present

## 2014-06-26 DIAGNOSIS — R5381 Other malaise: Secondary | ICD-10-CM | POA: Diagnosis not present

## 2014-06-26 DIAGNOSIS — E119 Type 2 diabetes mellitus without complications: Secondary | ICD-10-CM | POA: Diagnosis not present

## 2014-06-26 DIAGNOSIS — M6281 Muscle weakness (generalized): Secondary | ICD-10-CM | POA: Diagnosis not present

## 2014-06-26 DIAGNOSIS — E162 Hypoglycemia, unspecified: Secondary | ICD-10-CM | POA: Diagnosis not present

## 2014-06-26 DIAGNOSIS — L98493 Non-pressure chronic ulcer of skin of other sites with necrosis of muscle: Secondary | ICD-10-CM | POA: Diagnosis not present

## 2014-06-26 DIAGNOSIS — R110 Nausea: Secondary | ICD-10-CM | POA: Diagnosis not present

## 2014-06-26 DIAGNOSIS — R262 Difficulty in walking, not elsewhere classified: Secondary | ICD-10-CM | POA: Diagnosis not present

## 2014-06-26 DIAGNOSIS — L259 Unspecified contact dermatitis, unspecified cause: Secondary | ICD-10-CM | POA: Diagnosis not present

## 2014-06-30 LAB — PROTIME-INR

## 2014-07-03 DIAGNOSIS — L259 Unspecified contact dermatitis, unspecified cause: Secondary | ICD-10-CM | POA: Diagnosis not present

## 2014-07-03 DIAGNOSIS — L98493 Non-pressure chronic ulcer of skin of other sites with necrosis of muscle: Secondary | ICD-10-CM | POA: Diagnosis not present

## 2014-07-10 DIAGNOSIS — L259 Unspecified contact dermatitis, unspecified cause: Secondary | ICD-10-CM | POA: Diagnosis not present

## 2014-07-10 DIAGNOSIS — L98493 Non-pressure chronic ulcer of skin of other sites with necrosis of muscle: Secondary | ICD-10-CM | POA: Diagnosis not present

## 2014-07-11 DIAGNOSIS — F4522 Body dysmorphic disorder: Secondary | ICD-10-CM | POA: Diagnosis not present

## 2014-07-11 DIAGNOSIS — I10 Essential (primary) hypertension: Secondary | ICD-10-CM | POA: Diagnosis not present

## 2014-07-11 DIAGNOSIS — E785 Hyperlipidemia, unspecified: Secondary | ICD-10-CM | POA: Diagnosis not present

## 2014-07-11 DIAGNOSIS — E1140 Type 2 diabetes mellitus with diabetic neuropathy, unspecified: Secondary | ICD-10-CM | POA: Diagnosis not present

## 2014-07-11 DIAGNOSIS — Z1389 Encounter for screening for other disorder: Secondary | ICD-10-CM | POA: Diagnosis not present

## 2014-07-11 DIAGNOSIS — E039 Hypothyroidism, unspecified: Secondary | ICD-10-CM | POA: Diagnosis not present

## 2014-07-14 ENCOUNTER — Ambulatory Visit: Payer: Medicare Other | Admitting: Podiatry

## 2014-07-15 DIAGNOSIS — R5381 Other malaise: Secondary | ICD-10-CM | POA: Diagnosis not present

## 2014-07-15 DIAGNOSIS — M6281 Muscle weakness (generalized): Secondary | ICD-10-CM | POA: Diagnosis not present

## 2014-07-18 DIAGNOSIS — L259 Unspecified contact dermatitis, unspecified cause: Secondary | ICD-10-CM | POA: Diagnosis not present

## 2014-07-18 DIAGNOSIS — L98493 Non-pressure chronic ulcer of skin of other sites with necrosis of muscle: Secondary | ICD-10-CM | POA: Diagnosis not present

## 2014-07-24 DIAGNOSIS — L259 Unspecified contact dermatitis, unspecified cause: Secondary | ICD-10-CM | POA: Diagnosis not present

## 2014-07-24 DIAGNOSIS — L98493 Non-pressure chronic ulcer of skin of other sites with necrosis of muscle: Secondary | ICD-10-CM | POA: Diagnosis not present

## 2014-07-30 ENCOUNTER — Ambulatory Visit (INDEPENDENT_AMBULATORY_CARE_PROVIDER_SITE_OTHER): Payer: Medicare Other | Admitting: Cardiology

## 2014-07-30 DIAGNOSIS — Z86711 Personal history of pulmonary embolism: Secondary | ICD-10-CM | POA: Diagnosis not present

## 2014-07-30 DIAGNOSIS — Z7901 Long term (current) use of anticoagulants: Secondary | ICD-10-CM

## 2014-07-30 DIAGNOSIS — I10 Essential (primary) hypertension: Secondary | ICD-10-CM | POA: Diagnosis not present

## 2014-07-30 DIAGNOSIS — E1140 Type 2 diabetes mellitus with diabetic neuropathy, unspecified: Secondary | ICD-10-CM | POA: Diagnosis not present

## 2014-07-30 DIAGNOSIS — D649 Anemia, unspecified: Secondary | ICD-10-CM | POA: Diagnosis not present

## 2014-07-30 DIAGNOSIS — K6389 Other specified diseases of intestine: Secondary | ICD-10-CM | POA: Diagnosis not present

## 2014-07-30 DIAGNOSIS — S36503S Unspecified injury of sigmoid colon, sequela: Secondary | ICD-10-CM | POA: Diagnosis not present

## 2014-07-30 DIAGNOSIS — M6281 Muscle weakness (generalized): Secondary | ICD-10-CM | POA: Diagnosis not present

## 2014-07-30 LAB — POCT INR: INR: 2.1

## 2014-07-31 ENCOUNTER — Telehealth: Payer: Self-pay | Admitting: Family Medicine

## 2014-07-31 NOTE — Telephone Encounter (Signed)
Left a detailed message on Debbie's voicemail with the information below.

## 2014-07-31 NOTE — Telephone Encounter (Signed)
Debbie from [place] saw pt when she came home from the hospital. She said pt was very shaky so she checked CBG level and it was 75 and she had already taken her metformin. She had extensive bowel surgery. Eunice Blase is asking if she need to cut back on her metformin because she is very weak and is not eating. They discontinued her glipizide while she was in the hospital. Eunice Blase will be going to see her again on Friday 08/01/14    Debbie [phone]

## 2014-07-31 NOTE — Telephone Encounter (Signed)
75 is a normal blood sugar. Advise appt if any concerns. I have not seen her in awhile and am not aware of any recent hospitalization.

## 2014-08-01 ENCOUNTER — Telehealth: Payer: Self-pay | Admitting: Family Medicine

## 2014-08-01 DIAGNOSIS — D649 Anemia, unspecified: Secondary | ICD-10-CM | POA: Diagnosis not present

## 2014-08-01 DIAGNOSIS — E1140 Type 2 diabetes mellitus with diabetic neuropathy, unspecified: Secondary | ICD-10-CM | POA: Diagnosis not present

## 2014-08-01 DIAGNOSIS — S36503S Unspecified injury of sigmoid colon, sequela: Secondary | ICD-10-CM | POA: Diagnosis not present

## 2014-08-01 DIAGNOSIS — Z86711 Personal history of pulmonary embolism: Secondary | ICD-10-CM | POA: Diagnosis not present

## 2014-08-01 DIAGNOSIS — I10 Essential (primary) hypertension: Secondary | ICD-10-CM | POA: Diagnosis not present

## 2014-08-01 DIAGNOSIS — M6281 Muscle weakness (generalized): Secondary | ICD-10-CM | POA: Diagnosis not present

## 2014-08-01 DIAGNOSIS — K6389 Other specified diseases of intestine: Secondary | ICD-10-CM | POA: Diagnosis not present

## 2014-08-01 NOTE — Telephone Encounter (Signed)
Need to see pt to order PT as have not seen in some time. Per haps should reorder with whomever ordered it currently? Or, schedule OV. Thanks.

## 2014-08-01 NOTE — Telephone Encounter (Signed)
Kim with [facility] would like to continue PT with patient 2 times per week for 6 weeks.  Ok to leave order on vm if needed.

## 2014-08-01 NOTE — Telephone Encounter (Signed)
I called Hannah [name] and informed her of the message below and she stated she will try contacting the physician from the skilled nursing facility the pt was discharged from as the pt had a lengthy stay in the hospital after a colon resection, has a colostomy bag and she will also remind the pt to call the office to schedule a follow up hospital visit.  Message forwarded to Dr [name].

## 2014-08-04 DIAGNOSIS — S36503S Unspecified injury of sigmoid colon, sequela: Secondary | ICD-10-CM | POA: Diagnosis not present

## 2014-08-04 DIAGNOSIS — D649 Anemia, unspecified: Secondary | ICD-10-CM | POA: Diagnosis not present

## 2014-08-04 DIAGNOSIS — I10 Essential (primary) hypertension: Secondary | ICD-10-CM | POA: Diagnosis not present

## 2014-08-04 DIAGNOSIS — E1140 Type 2 diabetes mellitus with diabetic neuropathy, unspecified: Secondary | ICD-10-CM | POA: Diagnosis not present

## 2014-08-04 DIAGNOSIS — Z86711 Personal history of pulmonary embolism: Secondary | ICD-10-CM | POA: Diagnosis not present

## 2014-08-04 DIAGNOSIS — K6389 Other specified diseases of intestine: Secondary | ICD-10-CM | POA: Diagnosis not present

## 2014-08-04 DIAGNOSIS — M6281 Muscle weakness (generalized): Secondary | ICD-10-CM | POA: Diagnosis not present

## 2014-08-05 ENCOUNTER — Telehealth: Payer: Self-pay | Admitting: Family Medicine

## 2014-08-05 DIAGNOSIS — D649 Anemia, unspecified: Secondary | ICD-10-CM | POA: Diagnosis not present

## 2014-08-05 DIAGNOSIS — I10 Essential (primary) hypertension: Secondary | ICD-10-CM | POA: Diagnosis not present

## 2014-08-05 DIAGNOSIS — Z86711 Personal history of pulmonary embolism: Secondary | ICD-10-CM | POA: Diagnosis not present

## 2014-08-05 DIAGNOSIS — K6389 Other specified diseases of intestine: Secondary | ICD-10-CM | POA: Diagnosis not present

## 2014-08-05 DIAGNOSIS — E1140 Type 2 diabetes mellitus with diabetic neuropathy, unspecified: Secondary | ICD-10-CM | POA: Diagnosis not present

## 2014-08-05 DIAGNOSIS — M6281 Muscle weakness (generalized): Secondary | ICD-10-CM | POA: Diagnosis not present

## 2014-08-05 DIAGNOSIS — S36503S Unspecified injury of sigmoid colon, sequela: Secondary | ICD-10-CM | POA: Diagnosis not present

## 2014-08-05 NOTE — Telephone Encounter (Signed)
This pt has not seen [place] in some time and needs a visit. Have advised visit. Can't provide medical information without consent from pt. Forwarding to RN.

## 2014-08-05 NOTE — Telephone Encounter (Signed)
Carollee Herter from [facility] is calling requesting pt a1c,bp  And pt was in hospital and shannon would like to speak with nurse to get some info on pt hospital stay. Carollee Herter will fax over medical release authorization.

## 2014-08-06 DIAGNOSIS — I10 Essential (primary) hypertension: Secondary | ICD-10-CM | POA: Diagnosis not present

## 2014-08-06 DIAGNOSIS — M6281 Muscle weakness (generalized): Secondary | ICD-10-CM | POA: Diagnosis not present

## 2014-08-06 DIAGNOSIS — D649 Anemia, unspecified: Secondary | ICD-10-CM | POA: Diagnosis not present

## 2014-08-06 DIAGNOSIS — E1140 Type 2 diabetes mellitus with diabetic neuropathy, unspecified: Secondary | ICD-10-CM | POA: Diagnosis not present

## 2014-08-06 DIAGNOSIS — K6389 Other specified diseases of intestine: Secondary | ICD-10-CM | POA: Diagnosis not present

## 2014-08-06 DIAGNOSIS — S36503S Unspecified injury of sigmoid colon, sequela: Secondary | ICD-10-CM | POA: Diagnosis not present

## 2014-08-06 DIAGNOSIS — Z86711 Personal history of pulmonary embolism: Secondary | ICD-10-CM | POA: Diagnosis not present

## 2014-08-06 DIAGNOSIS — Z933 Colostomy status: Secondary | ICD-10-CM | POA: Diagnosis not present

## 2014-08-06 DIAGNOSIS — K5660 Unspecified intestinal obstruction: Secondary | ICD-10-CM | POA: Diagnosis not present

## 2014-08-06 NOTE — Telephone Encounter (Signed)
Called and spoke with patient. Patient states a homehealth nurse comes to her house to draw the labs [facility] is requesting. Patient has concerns of being able to come into office. Asked patient to get homehealth nurse to give me a call when she arrives this afternoon.

## 2014-08-07 DIAGNOSIS — D649 Anemia, unspecified: Secondary | ICD-10-CM | POA: Diagnosis not present

## 2014-08-07 DIAGNOSIS — E1140 Type 2 diabetes mellitus with diabetic neuropathy, unspecified: Secondary | ICD-10-CM | POA: Diagnosis not present

## 2014-08-07 DIAGNOSIS — S36503S Unspecified injury of sigmoid colon, sequela: Secondary | ICD-10-CM | POA: Diagnosis not present

## 2014-08-07 DIAGNOSIS — I10 Essential (primary) hypertension: Secondary | ICD-10-CM | POA: Diagnosis not present

## 2014-08-07 DIAGNOSIS — Z86711 Personal history of pulmonary embolism: Secondary | ICD-10-CM | POA: Diagnosis not present

## 2014-08-07 DIAGNOSIS — M6281 Muscle weakness (generalized): Secondary | ICD-10-CM | POA: Diagnosis not present

## 2014-08-07 DIAGNOSIS — K6389 Other specified diseases of intestine: Secondary | ICD-10-CM | POA: Diagnosis not present

## 2014-08-08 ENCOUNTER — Ambulatory Visit (INDEPENDENT_AMBULATORY_CARE_PROVIDER_SITE_OTHER): Payer: Medicare Other | Admitting: Pharmacist

## 2014-08-08 DIAGNOSIS — Z86711 Personal history of pulmonary embolism: Secondary | ICD-10-CM

## 2014-08-08 DIAGNOSIS — K6389 Other specified diseases of intestine: Secondary | ICD-10-CM | POA: Diagnosis not present

## 2014-08-08 DIAGNOSIS — I10 Essential (primary) hypertension: Secondary | ICD-10-CM | POA: Diagnosis not present

## 2014-08-08 DIAGNOSIS — S36503S Unspecified injury of sigmoid colon, sequela: Secondary | ICD-10-CM | POA: Diagnosis not present

## 2014-08-08 DIAGNOSIS — D649 Anemia, unspecified: Secondary | ICD-10-CM | POA: Diagnosis not present

## 2014-08-08 DIAGNOSIS — M6281 Muscle weakness (generalized): Secondary | ICD-10-CM | POA: Diagnosis not present

## 2014-08-08 DIAGNOSIS — Z7901 Long term (current) use of anticoagulants: Secondary | ICD-10-CM

## 2014-08-08 DIAGNOSIS — E1140 Type 2 diabetes mellitus with diabetic neuropathy, unspecified: Secondary | ICD-10-CM | POA: Diagnosis not present

## 2014-08-08 LAB — POCT INR: INR: 1.2

## 2014-08-08 NOTE — Telephone Encounter (Signed)
Left message with Home Health nurse Eunice Blase) to call me back

## 2014-08-11 ENCOUNTER — Telehealth: Payer: Self-pay | Admitting: *Deleted

## 2014-08-11 NOTE — Telephone Encounter (Signed)
Dr [name] received a fax order form from [facility] for a home health aide.  Per Dr [name] she cannot complete the forms as she has not seen the pt in over 4 months and I called [facility] at [phone] and informed [name] of this.  I also advised her we received an order form for ostomy supplies from [facility] and she cannot complete this and they should contact the GI or surgeon for this and she agreed.

## 2014-08-12 ENCOUNTER — Ambulatory Visit (INDEPENDENT_AMBULATORY_CARE_PROVIDER_SITE_OTHER): Payer: Medicare Other | Admitting: Family Medicine

## 2014-08-12 ENCOUNTER — Encounter: Payer: Self-pay | Admitting: Family Medicine

## 2014-08-12 VITALS — BP 120/80 | Temp 98.6°F | Wt 182.0 lb

## 2014-08-12 DIAGNOSIS — M1990 Unspecified osteoarthritis, unspecified site: Secondary | ICD-10-CM | POA: Insufficient documentation

## 2014-08-12 DIAGNOSIS — I10 Essential (primary) hypertension: Secondary | ICD-10-CM | POA: Diagnosis not present

## 2014-08-12 DIAGNOSIS — K6389 Other specified diseases of intestine: Secondary | ICD-10-CM | POA: Diagnosis not present

## 2014-08-12 DIAGNOSIS — R5381 Other malaise: Secondary | ICD-10-CM | POA: Diagnosis not present

## 2014-08-12 DIAGNOSIS — E46 Unspecified protein-calorie malnutrition: Secondary | ICD-10-CM

## 2014-08-12 DIAGNOSIS — M6281 Muscle weakness (generalized): Secondary | ICD-10-CM | POA: Diagnosis not present

## 2014-08-12 DIAGNOSIS — Z86711 Personal history of pulmonary embolism: Secondary | ICD-10-CM

## 2014-08-12 DIAGNOSIS — D649 Anemia, unspecified: Secondary | ICD-10-CM | POA: Diagnosis not present

## 2014-08-12 DIAGNOSIS — E038 Other specified hypothyroidism: Secondary | ICD-10-CM

## 2014-08-12 DIAGNOSIS — S36503S Unspecified injury of sigmoid colon, sequela: Secondary | ICD-10-CM | POA: Diagnosis not present

## 2014-08-12 DIAGNOSIS — E1149 Type 2 diabetes mellitus with other diabetic neurological complication: Secondary | ICD-10-CM

## 2014-08-12 DIAGNOSIS — M159 Polyosteoarthritis, unspecified: Secondary | ICD-10-CM

## 2014-08-12 DIAGNOSIS — E1140 Type 2 diabetes mellitus with diabetic neuropathy, unspecified: Secondary | ICD-10-CM | POA: Diagnosis not present

## 2014-08-12 LAB — BASIC METABOLIC PANEL
BUN: 9 mg/dL (ref 6–23)
CALCIUM: 8.6 mg/dL (ref 8.4–10.5)
CO2: 22 mEq/L (ref 19–32)
CREATININE: 1 mg/dL (ref 0.40–1.20)
Chloride: 104 mEq/L (ref 96–112)
GFR: 69.85 mL/min (ref >60.00)
Glucose, Bld: 110 mg/dL — ABNORMAL HIGH (ref 70–99)
POTASSIUM: 3.6 meq/L (ref 3.5–5.1)
Sodium: 138 mEq/L (ref 135–145)

## 2014-08-12 LAB — CBC WITH DIFFERENTIAL/PLATELET
Basophils Absolute: 0.1 10*3/uL (ref 0.0–0.1)
Basophils Relative: 0.7 % (ref 0.0–3.0)
EOS ABS: 0.2 10*3/uL (ref 0.0–0.7)
EOS PCT: 2.3 % (ref 0.0–5.0)
HEMATOCRIT: 32.8 % — AB (ref 36.0–46.0)
HEMOGLOBIN: 10.4 g/dL — AB (ref 12.0–15.0)
Lymphocytes Relative: 40.7 % (ref 12.0–46.0)
Lymphs Abs: 4.3 10*3/uL — ABNORMAL HIGH (ref 0.7–4.0)
MCHC: 31.6 g/dL (ref 30.0–36.0)
MCV: 87.5 fl (ref 78.0–100.0)
Monocytes Absolute: 0.5 10*3/uL (ref 0.1–1.0)
Monocytes Relative: 5 % (ref 3.0–12.0)
NEUTROS ABS: 5.4 10*3/uL (ref 1.4–7.7)
NEUTROS PCT: 51.3 % (ref 43.0–77.0)
PLATELETS: 294 10*3/uL (ref 150.0–400.0)
RBC: 3.75 Mil/uL — AB (ref 3.87–5.11)
RDW: 18 % — ABNORMAL HIGH (ref 11.5–15.5)
WBC: 10.5 10*3/uL (ref 4.0–10.5)

## 2014-08-12 LAB — TSH: TSH: 1.19 u[IU]/mL (ref 0.35–4.50)

## 2014-08-12 LAB — HEMOGLOBIN A1C: HEMOGLOBIN A1C: 6.4 % (ref 4.6–6.5)

## 2014-08-12 NOTE — Telephone Encounter (Signed)
Called home health nurse and she advised she does not check A1C but could if [facility] requests so. I have called [employer] representative and left message to call back to ensure form completion.

## 2014-08-12 NOTE — Progress Notes (Signed)
HPI:  Hannah [name] is a 74 yo F with PMH sig for thromboembolism, Thyroid disease, HLD, HTN, DM here for a work in visit for follow up. She has not been seen in  > 4 months. She has been through a lot since the last time I saw her. On review of her chart, she had a bowel stricture with partial colectomy and colostomy in 5.2016 complicated by postoperative ileus and surgical wound dinfection w/ temporary need for TPN per notes. She unfortunately has recovered poorly in from this.  New Issues:  Deconditioning: -poor recovery following hospitalization and bowel surgery in 04/2014 -her daughter reports she was in a SNF [facility])  until Aug 3rd and was sent home with home health for PT/OT - no records from this stay available -surgeon was Dr. Mignon Pine, last seen July 19th, reports has home health wound nurse and follow up with surgeon -she is getting coumadin checks at home -reports she is still getting occupational and physical therapy at home due to persistent weakness  -she lives alone, but has lifesline and family members whom check on her daily and help with meals -she can walk with her cane, but is still is requiring assistance with bathing, an aide is coming several days per week to assist with this -reports she is eating 3 meals per day now, feels like appetitive is improving finally -no fevers, chills, coughing, SOB  Chronic issues:  Hypothyroid:  -on synthroid chronically  -low dose  -denies: hot/cold intol, palpitations, skin or nail changes  DM:  -needs new meter, report home meter is old, plugs in and often does not work  -home BS: 1 teens - 130s fasting  -current meds:metformin 1000mg  bid used to take glipizide 5mg  daily - now not taking -denies: polyuria, polydipsia, hypoglycemia, foot ulcers, changes in vision    Hx PE and cardiogenic shock, HTN, HLD:  -followed by Dr. Tenny Craw in Cardiology  -on coumadin managed by cards coumadin clinic, statin,  metoprolol  -denies: CP, SOB, swelling, palpitations, DOE, orthopnea   5)Knee and Hip OA s/p hip and knee replacements: -uses [employer] transportation -Saw Dr. Luiz Blare at [facility] in the past -has long hx of severe pain in hips and knees when walks more then a few blocks or stands for long periods of time, has difficulty boarding a bus or walking ups hills -uses walker and cane to ambulate   ROS: See pertinent positives and negatives per HPI.  Past Medical History  Diagnosis Date  . Thyroid disease   . Acute massive pulmonary embolism 07/13/2012    Massive PE w/ PEA arrest 07/13/12 >TNK >IVC filter >discharged on comadin    . Diabetes mellitus, type 2 07/15/2012    with peripheral neuropathy  . Hypertension 07/14/2012  . Hyperlipemia 07/14/2012  . Large bowel stricture     s/p colectomy in 2016  . Osteoarthritis     s/p hip and knee replacements    Past Surgical History  Procedure Laterality Date  . Sp arthro hip*l*    . Knee arthroscopy    . Insertion of vena cava filter N/A 07/16/2012    Procedure: INSERTION OF VENA CAVA FILTER;  Surgeon: [name], MD;  Location: [facility] CATH LAB;  Service: Cardiovascular;  Laterality: N/A;  . Flexible sigmoidoscopy N/A 05/30/2014    Procedure: FLEXIBLE SIGMOIDOSCOPY;  Surgeon: Jeani Hawking, MD;  Location: [facility] ENDOSCOPY;  Service: Endoscopy;  Laterality: N/A;  . Partial colectomy N/A 06/03/2014    Procedure: PARTIAL COLECTOMY;  Surgeon: Harriette Bouillon, MD;  Location: [facility] OR;  Service: General;  Laterality: N/A;  . Colostomy N/A 06/03/2014    Procedure: COLOSTOMY;  Surgeon: Harriette Bouillon, MD;  Location: [facility] OR;  Service: General;  Laterality: N/A;    No family history on file.  History   Social History  . Marital Status: Widowed    Spouse Name: N/A  . Number of Children: N/A  . Years of Education: N/A   Social History Main Topics  . Smoking status: Former Smoker -- 1.00 packs/day for 10 years    Types: Cigarettes    Quit date:  01/04/1968  . Smokeless tobacco: Never Used  . Alcohol Use: No  . Drug Use: No  . Sexual Activity: Not on file   Other Topics Concern  . None   Social History Narrative   Lives alone.            Current outpatient prescriptions:  .  feeding supplement, GLUCERNA SHAKE, (GLUCERNA SHAKE) LIQD, Take 237 mLs by mouth 3 (three) times daily between meals., Disp: 90 Can, Rfl: 0 .  glucose blood (ONETOUCH VERIO) test strip, 1 each by Other route daily. Dx E11.9, Disp: , Rfl:  .  levothyroxine (SYNTHROID, LEVOTHROID) 25 MCG tablet, Take 1 tablet by mouth  daily before breakfast, Disp: 90 tablet, Rfl: 3 .  metFORMIN (GLUCOPHAGE) 1000 MG tablet,  (1 tablet) with breakfast and  (1 tablet) with dinner. (Patient taking differently: Take 1,000 mg by mouth 2 (two) times daily with a meal.  (1 tablet) with breakfast and  (1 tablet) with dinner.), Disp: 180 tablet, Rfl: 3 .  metoprolol tartrate (LOPRESSOR) 25 MG tablet, Take 1 tablet (25 mg total) by mouth 2 (two) times daily., Disp: 180 tablet, Rfl: 3 .  ondansetron (ZOFRAN ODT) 4 MG disintegrating tablet, Take 1 tablet (4 mg total) by mouth every 8 (eight) hours as needed for nausea or vomiting., Disp: 20 tablet, Rfl: 0 .  ONETOUCH DELICA LANCETS FINE MISC, 1 each by Does not apply route daily. E11.9, Disp: , Rfl:  .  simvastatin (ZOCOR) 20 MG tablet, Take 1 tablet (20 mg total) by mouth at bedtime., Disp: 90 tablet, Rfl: 3 .  vitamin C (ASCORBIC ACID) 500 MG tablet, Take 500 mg by mouth daily. , Disp: , Rfl:  .  warfarin (COUMADIN) 5 MG tablet, Take 2.5-5 mg by mouth See admin instructions. Take 1 tablet all days except on Tues and Sat take 1/2 tablet, Disp: , Rfl:   EXAM:  Filed Vitals:   08/12/14 1420  BP: 120/80  Temp: 98.6 F (37 C)    Body mass index is 28.5 kg/(m^2).  GENERAL: vitals reviewed and listed above, alert, oriented, appears well hydrated and in no acute distress, walks slowly with a cane  HEENT:  atraumatic, conjunttiva clear, no obvious abnormalities on inspection of external nose and ears  NECK: no obvious masses on inspection  LUNGS: clear to auscultation bilaterally, no wheezes, rales or rhonchi, good air movement  CV: HRRR, no peripheral edema  MS: moves all extremities without noticeable abnormality  PSYCH: pleasant and cooperative, no obvious depression or anxiety  ASSESSMENT AND PLAN:  Discussed the following assessment and plan:  Physical deconditioning - Plan: CBC with Differential, Prealbumin  Protein-calorie malnutrition - Plan: CBC with Differential, Prealbumin  Other specified hypothyroidism - Plan: TSH  Essential hypertension - Plan: Basic metabolic panel  Diabetes mellitus with neurological manifestations, controlled - Plan: Hemoglobin A1c  History of pulmonary embolism  Osteoarthritis  of multiple joints, unspecified osteoarthritis type  -check labs -advised assistant to obtain records of SNF stay -coumadin managed by cards -advised healthy regular meals and consider nutrition referral if persistent poor recovery despite better intake now that appetite is returning -new meter and instruction provided -orders for [facility] aide for bathing signed -will need flu shot and advised of options for this, not currently available in our clinic -Patient advised to return or notify a doctor immediately if symptoms worsen or persist or new concerns arise.  Patient Instructions  BEFORE YOU LEAVE: -blood glucose meter and supplies -labs -schedule follow up in 3 months  Please eat three meals daily that include lean protein and regular healthy snacks  Please address any concerns regarding your wound or ostomy with your surgeon         [name].

## 2014-08-12 NOTE — Patient Instructions (Signed)
BEFORE YOU LEAVE: -blood glucose meter and supplies -labs -schedule follow up in 3 months  Please eat three meals daily that include lean protein and regular healthy snacks  Please address any concerns regarding your wound or ostomy with your surgeon

## 2014-08-12 NOTE — Progress Notes (Signed)
Pre visit review using our clinic review tool, if applicable. No additional management support is needed unless otherwise documented below in the visit note. 

## 2014-08-13 DIAGNOSIS — I10 Essential (primary) hypertension: Secondary | ICD-10-CM | POA: Diagnosis not present

## 2014-08-13 DIAGNOSIS — S36503S Unspecified injury of sigmoid colon, sequela: Secondary | ICD-10-CM | POA: Diagnosis not present

## 2014-08-13 DIAGNOSIS — M6281 Muscle weakness (generalized): Secondary | ICD-10-CM | POA: Diagnosis not present

## 2014-08-13 DIAGNOSIS — Z86711 Personal history of pulmonary embolism: Secondary | ICD-10-CM | POA: Diagnosis not present

## 2014-08-13 DIAGNOSIS — E1140 Type 2 diabetes mellitus with diabetic neuropathy, unspecified: Secondary | ICD-10-CM | POA: Diagnosis not present

## 2014-08-13 DIAGNOSIS — K6389 Other specified diseases of intestine: Secondary | ICD-10-CM | POA: Diagnosis not present

## 2014-08-13 DIAGNOSIS — D649 Anemia, unspecified: Secondary | ICD-10-CM | POA: Diagnosis not present

## 2014-08-13 LAB — PREALBUMIN: Prealbumin: 22 mg/dL (ref 17–34)

## 2014-08-14 ENCOUNTER — Ambulatory Visit: Payer: Medicare Other | Admitting: Family Medicine

## 2014-08-14 DIAGNOSIS — Z86711 Personal history of pulmonary embolism: Secondary | ICD-10-CM | POA: Diagnosis not present

## 2014-08-14 DIAGNOSIS — S36503S Unspecified injury of sigmoid colon, sequela: Secondary | ICD-10-CM | POA: Diagnosis not present

## 2014-08-14 DIAGNOSIS — M6281 Muscle weakness (generalized): Secondary | ICD-10-CM | POA: Diagnosis not present

## 2014-08-14 DIAGNOSIS — K6389 Other specified diseases of intestine: Secondary | ICD-10-CM | POA: Diagnosis not present

## 2014-08-14 DIAGNOSIS — D649 Anemia, unspecified: Secondary | ICD-10-CM | POA: Diagnosis not present

## 2014-08-14 DIAGNOSIS — I10 Essential (primary) hypertension: Secondary | ICD-10-CM | POA: Diagnosis not present

## 2014-08-14 DIAGNOSIS — E1140 Type 2 diabetes mellitus with diabetic neuropathy, unspecified: Secondary | ICD-10-CM | POA: Diagnosis not present

## 2014-08-14 NOTE — Telephone Encounter (Signed)
Patient came in for visit on 08/12/14. Recorded requested labs and faxed completed form to [facility]

## 2014-08-15 ENCOUNTER — Telehealth: Payer: Self-pay | Admitting: Family Medicine

## 2014-08-15 ENCOUNTER — Ambulatory Visit (INDEPENDENT_AMBULATORY_CARE_PROVIDER_SITE_OTHER): Payer: Medicare Other | Admitting: Pharmacist

## 2014-08-15 DIAGNOSIS — E1140 Type 2 diabetes mellitus with diabetic neuropathy, unspecified: Secondary | ICD-10-CM | POA: Diagnosis not present

## 2014-08-15 DIAGNOSIS — Z86711 Personal history of pulmonary embolism: Secondary | ICD-10-CM | POA: Diagnosis not present

## 2014-08-15 DIAGNOSIS — M6281 Muscle weakness (generalized): Secondary | ICD-10-CM | POA: Diagnosis not present

## 2014-08-15 DIAGNOSIS — I10 Essential (primary) hypertension: Secondary | ICD-10-CM | POA: Diagnosis not present

## 2014-08-15 DIAGNOSIS — S36503S Unspecified injury of sigmoid colon, sequela: Secondary | ICD-10-CM | POA: Diagnosis not present

## 2014-08-15 DIAGNOSIS — Z7901 Long term (current) use of anticoagulants: Secondary | ICD-10-CM

## 2014-08-15 DIAGNOSIS — K6389 Other specified diseases of intestine: Secondary | ICD-10-CM | POA: Diagnosis not present

## 2014-08-15 DIAGNOSIS — D649 Anemia, unspecified: Secondary | ICD-10-CM | POA: Diagnosis not present

## 2014-08-15 LAB — POCT INR: INR: 3.5

## 2014-08-15 NOTE — Telephone Encounter (Signed)
I do not manage her coumadin, this is managed by the cardiology coumadin clinic. Please provide nurse with cardiology information and number. Thanks.

## 2014-08-15 NOTE — Telephone Encounter (Signed)
Roderic Ovens w/ Genevieve Norlander w/ coum results INR  3.5  Jamalah stated she needs to know before she leaves what coum dose the pt needs tonight.  advised her, per saundrea,  I would send a message to Dr [name]  For that information.  No one in our coum clinic today. She asked me for the [facility] number to speak to sally in their coum clinic. Gave her the main humber.  Roderic Ovens very insistent she wants to know before she leaves.

## 2014-08-15 NOTE — Telephone Encounter (Signed)
Spoke with Hannah [name] and she has spoken to someone at the coumadin clinic.

## 2014-08-18 DIAGNOSIS — D649 Anemia, unspecified: Secondary | ICD-10-CM | POA: Diagnosis not present

## 2014-08-18 DIAGNOSIS — E1140 Type 2 diabetes mellitus with diabetic neuropathy, unspecified: Secondary | ICD-10-CM | POA: Diagnosis not present

## 2014-08-18 DIAGNOSIS — M6281 Muscle weakness (generalized): Secondary | ICD-10-CM | POA: Diagnosis not present

## 2014-08-18 DIAGNOSIS — I10 Essential (primary) hypertension: Secondary | ICD-10-CM | POA: Diagnosis not present

## 2014-08-18 DIAGNOSIS — Z86711 Personal history of pulmonary embolism: Secondary | ICD-10-CM | POA: Diagnosis not present

## 2014-08-18 DIAGNOSIS — K6389 Other specified diseases of intestine: Secondary | ICD-10-CM | POA: Diagnosis not present

## 2014-08-18 DIAGNOSIS — S36503S Unspecified injury of sigmoid colon, sequela: Secondary | ICD-10-CM | POA: Diagnosis not present

## 2014-08-19 ENCOUNTER — Telehealth: Payer: Self-pay | Admitting: *Deleted

## 2014-08-19 DIAGNOSIS — Z86711 Personal history of pulmonary embolism: Secondary | ICD-10-CM | POA: Diagnosis not present

## 2014-08-19 DIAGNOSIS — D649 Anemia, unspecified: Secondary | ICD-10-CM | POA: Diagnosis not present

## 2014-08-19 DIAGNOSIS — K6389 Other specified diseases of intestine: Secondary | ICD-10-CM | POA: Diagnosis not present

## 2014-08-19 DIAGNOSIS — I10 Essential (primary) hypertension: Secondary | ICD-10-CM | POA: Diagnosis not present

## 2014-08-19 DIAGNOSIS — E1140 Type 2 diabetes mellitus with diabetic neuropathy, unspecified: Secondary | ICD-10-CM | POA: Diagnosis not present

## 2014-08-19 DIAGNOSIS — M6281 Muscle weakness (generalized): Secondary | ICD-10-CM | POA: Diagnosis not present

## 2014-08-19 DIAGNOSIS — S36503S Unspecified injury of sigmoid colon, sequela: Secondary | ICD-10-CM | POA: Diagnosis not present

## 2014-08-19 NOTE — Telephone Encounter (Signed)
Dr [name] received a fax from [place] regarding the PT/INR results for the pt and stated this should be sent to the pts cardiologist. I called Genevieve Norlander at [phone] and informed Arline Asp of this and advised her Dr Carolanne Grumbling is the pts cardiologist according to her chart and she will forward this to the correct provider.

## 2014-08-21 DIAGNOSIS — Z86711 Personal history of pulmonary embolism: Secondary | ICD-10-CM | POA: Diagnosis not present

## 2014-08-21 DIAGNOSIS — S36503S Unspecified injury of sigmoid colon, sequela: Secondary | ICD-10-CM | POA: Diagnosis not present

## 2014-08-21 DIAGNOSIS — I10 Essential (primary) hypertension: Secondary | ICD-10-CM | POA: Diagnosis not present

## 2014-08-21 DIAGNOSIS — D649 Anemia, unspecified: Secondary | ICD-10-CM | POA: Diagnosis not present

## 2014-08-21 DIAGNOSIS — E1140 Type 2 diabetes mellitus with diabetic neuropathy, unspecified: Secondary | ICD-10-CM | POA: Diagnosis not present

## 2014-08-21 DIAGNOSIS — K6389 Other specified diseases of intestine: Secondary | ICD-10-CM | POA: Diagnosis not present

## 2014-08-21 DIAGNOSIS — M6281 Muscle weakness (generalized): Secondary | ICD-10-CM | POA: Diagnosis not present

## 2014-08-22 ENCOUNTER — Ambulatory Visit (INDEPENDENT_AMBULATORY_CARE_PROVIDER_SITE_OTHER): Payer: Medicare Other | Admitting: Pharmacist

## 2014-08-22 DIAGNOSIS — Z7901 Long term (current) use of anticoagulants: Secondary | ICD-10-CM

## 2014-08-22 DIAGNOSIS — E1140 Type 2 diabetes mellitus with diabetic neuropathy, unspecified: Secondary | ICD-10-CM | POA: Diagnosis not present

## 2014-08-22 DIAGNOSIS — M6281 Muscle weakness (generalized): Secondary | ICD-10-CM | POA: Diagnosis not present

## 2014-08-22 DIAGNOSIS — Z86711 Personal history of pulmonary embolism: Secondary | ICD-10-CM | POA: Diagnosis not present

## 2014-08-22 DIAGNOSIS — S36503S Unspecified injury of sigmoid colon, sequela: Secondary | ICD-10-CM | POA: Diagnosis not present

## 2014-08-22 DIAGNOSIS — I10 Essential (primary) hypertension: Secondary | ICD-10-CM | POA: Diagnosis not present

## 2014-08-22 DIAGNOSIS — K6389 Other specified diseases of intestine: Secondary | ICD-10-CM | POA: Diagnosis not present

## 2014-08-22 DIAGNOSIS — D649 Anemia, unspecified: Secondary | ICD-10-CM | POA: Diagnosis not present

## 2014-08-22 LAB — POCT INR: INR: 5.6

## 2014-08-26 DIAGNOSIS — S36503S Unspecified injury of sigmoid colon, sequela: Secondary | ICD-10-CM | POA: Diagnosis not present

## 2014-08-26 DIAGNOSIS — E1140 Type 2 diabetes mellitus with diabetic neuropathy, unspecified: Secondary | ICD-10-CM | POA: Diagnosis not present

## 2014-08-26 DIAGNOSIS — D649 Anemia, unspecified: Secondary | ICD-10-CM | POA: Diagnosis not present

## 2014-08-26 DIAGNOSIS — K6389 Other specified diseases of intestine: Secondary | ICD-10-CM | POA: Diagnosis not present

## 2014-08-26 DIAGNOSIS — I10 Essential (primary) hypertension: Secondary | ICD-10-CM | POA: Diagnosis not present

## 2014-08-26 DIAGNOSIS — Z86711 Personal history of pulmonary embolism: Secondary | ICD-10-CM | POA: Diagnosis not present

## 2014-08-26 DIAGNOSIS — M6281 Muscle weakness (generalized): Secondary | ICD-10-CM | POA: Diagnosis not present

## 2014-08-28 DIAGNOSIS — D649 Anemia, unspecified: Secondary | ICD-10-CM | POA: Diagnosis not present

## 2014-08-28 DIAGNOSIS — I10 Essential (primary) hypertension: Secondary | ICD-10-CM | POA: Diagnosis not present

## 2014-08-28 DIAGNOSIS — M6281 Muscle weakness (generalized): Secondary | ICD-10-CM | POA: Diagnosis not present

## 2014-08-28 DIAGNOSIS — E1140 Type 2 diabetes mellitus with diabetic neuropathy, unspecified: Secondary | ICD-10-CM | POA: Diagnosis not present

## 2014-08-28 DIAGNOSIS — Z86711 Personal history of pulmonary embolism: Secondary | ICD-10-CM | POA: Diagnosis not present

## 2014-08-28 DIAGNOSIS — S36503S Unspecified injury of sigmoid colon, sequela: Secondary | ICD-10-CM | POA: Diagnosis not present

## 2014-08-28 DIAGNOSIS — K6389 Other specified diseases of intestine: Secondary | ICD-10-CM | POA: Diagnosis not present

## 2014-08-29 ENCOUNTER — Ambulatory Visit (INDEPENDENT_AMBULATORY_CARE_PROVIDER_SITE_OTHER): Payer: Medicare Other | Admitting: Cardiology

## 2014-08-29 DIAGNOSIS — D649 Anemia, unspecified: Secondary | ICD-10-CM | POA: Diagnosis not present

## 2014-08-29 DIAGNOSIS — Z4801 Encounter for change or removal of surgical wound dressing: Secondary | ICD-10-CM | POA: Diagnosis not present

## 2014-08-29 DIAGNOSIS — S36503S Unspecified injury of sigmoid colon, sequela: Secondary | ICD-10-CM | POA: Diagnosis not present

## 2014-08-29 DIAGNOSIS — Z86711 Personal history of pulmonary embolism: Secondary | ICD-10-CM | POA: Diagnosis not present

## 2014-08-29 DIAGNOSIS — E119 Type 2 diabetes mellitus without complications: Secondary | ICD-10-CM | POA: Diagnosis not present

## 2014-08-29 DIAGNOSIS — Z933 Colostomy status: Secondary | ICD-10-CM | POA: Diagnosis not present

## 2014-08-29 DIAGNOSIS — M6281 Muscle weakness (generalized): Secondary | ICD-10-CM | POA: Diagnosis not present

## 2014-08-29 DIAGNOSIS — Z7901 Long term (current) use of anticoagulants: Secondary | ICD-10-CM

## 2014-08-29 DIAGNOSIS — I10 Essential (primary) hypertension: Secondary | ICD-10-CM | POA: Diagnosis not present

## 2014-08-29 DIAGNOSIS — E1140 Type 2 diabetes mellitus with diabetic neuropathy, unspecified: Secondary | ICD-10-CM | POA: Diagnosis not present

## 2014-08-29 DIAGNOSIS — K5660 Unspecified intestinal obstruction: Secondary | ICD-10-CM | POA: Diagnosis not present

## 2014-08-29 DIAGNOSIS — K6389 Other specified diseases of intestine: Secondary | ICD-10-CM | POA: Diagnosis not present

## 2014-08-29 LAB — POCT INR: INR: 1.7

## 2014-09-01 ENCOUNTER — Telehealth: Payer: Self-pay | Admitting: Family Medicine

## 2014-09-01 NOTE — Telephone Encounter (Signed)
Pt would like a call back with  a1c and  blood pressure numbers. She need the last office visit results. She also need the date these were done per her case manager with [facility] .

## 2014-09-02 DIAGNOSIS — K6389 Other specified diseases of intestine: Secondary | ICD-10-CM | POA: Diagnosis not present

## 2014-09-02 DIAGNOSIS — I10 Essential (primary) hypertension: Secondary | ICD-10-CM | POA: Diagnosis not present

## 2014-09-02 DIAGNOSIS — Z86711 Personal history of pulmonary embolism: Secondary | ICD-10-CM | POA: Diagnosis not present

## 2014-09-02 DIAGNOSIS — S36503S Unspecified injury of sigmoid colon, sequela: Secondary | ICD-10-CM | POA: Diagnosis not present

## 2014-09-02 DIAGNOSIS — M6281 Muscle weakness (generalized): Secondary | ICD-10-CM | POA: Diagnosis not present

## 2014-09-02 DIAGNOSIS — E1140 Type 2 diabetes mellitus with diabetic neuropathy, unspecified: Secondary | ICD-10-CM | POA: Diagnosis not present

## 2014-09-02 DIAGNOSIS — D649 Anemia, unspecified: Secondary | ICD-10-CM | POA: Diagnosis not present

## 2014-09-03 NOTE — Telephone Encounter (Signed)
I called the pts daughter and advised her a copy of the lab tests and office note from 8/9 will be left at the front desk for her to pick up as I cannot give these results to [facility].

## 2014-09-04 DIAGNOSIS — D649 Anemia, unspecified: Secondary | ICD-10-CM | POA: Diagnosis not present

## 2014-09-04 DIAGNOSIS — I10 Essential (primary) hypertension: Secondary | ICD-10-CM | POA: Diagnosis not present

## 2014-09-04 DIAGNOSIS — M6281 Muscle weakness (generalized): Secondary | ICD-10-CM | POA: Diagnosis not present

## 2014-09-04 DIAGNOSIS — Z86711 Personal history of pulmonary embolism: Secondary | ICD-10-CM | POA: Diagnosis not present

## 2014-09-04 DIAGNOSIS — K6389 Other specified diseases of intestine: Secondary | ICD-10-CM | POA: Diagnosis not present

## 2014-09-04 DIAGNOSIS — S36503S Unspecified injury of sigmoid colon, sequela: Secondary | ICD-10-CM | POA: Diagnosis not present

## 2014-09-04 DIAGNOSIS — E1140 Type 2 diabetes mellitus with diabetic neuropathy, unspecified: Secondary | ICD-10-CM | POA: Diagnosis not present

## 2014-09-05 ENCOUNTER — Ambulatory Visit (INDEPENDENT_AMBULATORY_CARE_PROVIDER_SITE_OTHER): Payer: Medicare Other | Admitting: Cardiovascular Disease

## 2014-09-05 DIAGNOSIS — K6389 Other specified diseases of intestine: Secondary | ICD-10-CM | POA: Diagnosis not present

## 2014-09-05 DIAGNOSIS — I10 Essential (primary) hypertension: Secondary | ICD-10-CM | POA: Diagnosis not present

## 2014-09-05 DIAGNOSIS — Z86711 Personal history of pulmonary embolism: Secondary | ICD-10-CM | POA: Diagnosis not present

## 2014-09-05 DIAGNOSIS — M6281 Muscle weakness (generalized): Secondary | ICD-10-CM | POA: Diagnosis not present

## 2014-09-05 DIAGNOSIS — Z7901 Long term (current) use of anticoagulants: Secondary | ICD-10-CM

## 2014-09-05 DIAGNOSIS — S36503S Unspecified injury of sigmoid colon, sequela: Secondary | ICD-10-CM | POA: Diagnosis not present

## 2014-09-05 DIAGNOSIS — E1140 Type 2 diabetes mellitus with diabetic neuropathy, unspecified: Secondary | ICD-10-CM | POA: Diagnosis not present

## 2014-09-05 DIAGNOSIS — D649 Anemia, unspecified: Secondary | ICD-10-CM | POA: Diagnosis not present

## 2014-09-05 LAB — POCT INR: INR: 2.6

## 2014-09-09 DIAGNOSIS — M6281 Muscle weakness (generalized): Secondary | ICD-10-CM | POA: Diagnosis not present

## 2014-09-09 DIAGNOSIS — I10 Essential (primary) hypertension: Secondary | ICD-10-CM | POA: Diagnosis not present

## 2014-09-09 DIAGNOSIS — K6389 Other specified diseases of intestine: Secondary | ICD-10-CM | POA: Diagnosis not present

## 2014-09-09 DIAGNOSIS — Z86711 Personal history of pulmonary embolism: Secondary | ICD-10-CM | POA: Diagnosis not present

## 2014-09-09 DIAGNOSIS — E1140 Type 2 diabetes mellitus with diabetic neuropathy, unspecified: Secondary | ICD-10-CM | POA: Diagnosis not present

## 2014-09-09 DIAGNOSIS — S36503S Unspecified injury of sigmoid colon, sequela: Secondary | ICD-10-CM | POA: Diagnosis not present

## 2014-09-09 DIAGNOSIS — D649 Anemia, unspecified: Secondary | ICD-10-CM | POA: Diagnosis not present

## 2014-09-10 ENCOUNTER — Other Ambulatory Visit: Payer: Self-pay | Admitting: Gastroenterology

## 2014-09-11 DIAGNOSIS — I10 Essential (primary) hypertension: Secondary | ICD-10-CM | POA: Diagnosis not present

## 2014-09-11 DIAGNOSIS — M6281 Muscle weakness (generalized): Secondary | ICD-10-CM | POA: Diagnosis not present

## 2014-09-11 DIAGNOSIS — Z86711 Personal history of pulmonary embolism: Secondary | ICD-10-CM | POA: Diagnosis not present

## 2014-09-11 DIAGNOSIS — D649 Anemia, unspecified: Secondary | ICD-10-CM | POA: Diagnosis not present

## 2014-09-11 DIAGNOSIS — K6389 Other specified diseases of intestine: Secondary | ICD-10-CM | POA: Diagnosis not present

## 2014-09-11 DIAGNOSIS — E1140 Type 2 diabetes mellitus with diabetic neuropathy, unspecified: Secondary | ICD-10-CM | POA: Diagnosis not present

## 2014-09-11 DIAGNOSIS — S36503S Unspecified injury of sigmoid colon, sequela: Secondary | ICD-10-CM | POA: Diagnosis not present

## 2014-09-12 ENCOUNTER — Ambulatory Visit (INDEPENDENT_AMBULATORY_CARE_PROVIDER_SITE_OTHER): Payer: Medicare Other | Admitting: Pharmacist

## 2014-09-12 DIAGNOSIS — S36503S Unspecified injury of sigmoid colon, sequela: Secondary | ICD-10-CM | POA: Diagnosis not present

## 2014-09-12 DIAGNOSIS — Z7901 Long term (current) use of anticoagulants: Secondary | ICD-10-CM

## 2014-09-12 DIAGNOSIS — Z86711 Personal history of pulmonary embolism: Secondary | ICD-10-CM

## 2014-09-12 DIAGNOSIS — D649 Anemia, unspecified: Secondary | ICD-10-CM | POA: Diagnosis not present

## 2014-09-12 DIAGNOSIS — M6281 Muscle weakness (generalized): Secondary | ICD-10-CM | POA: Diagnosis not present

## 2014-09-12 DIAGNOSIS — K6389 Other specified diseases of intestine: Secondary | ICD-10-CM | POA: Diagnosis not present

## 2014-09-12 DIAGNOSIS — E1140 Type 2 diabetes mellitus with diabetic neuropathy, unspecified: Secondary | ICD-10-CM | POA: Diagnosis not present

## 2014-09-12 DIAGNOSIS — I10 Essential (primary) hypertension: Secondary | ICD-10-CM | POA: Diagnosis not present

## 2014-09-12 LAB — POCT INR: INR: 4

## 2014-09-19 ENCOUNTER — Ambulatory Visit (INDEPENDENT_AMBULATORY_CARE_PROVIDER_SITE_OTHER): Payer: Medicare Other | Admitting: Pharmacist

## 2014-09-19 DIAGNOSIS — Z86711 Personal history of pulmonary embolism: Secondary | ICD-10-CM | POA: Diagnosis not present

## 2014-09-19 DIAGNOSIS — K6389 Other specified diseases of intestine: Secondary | ICD-10-CM | POA: Diagnosis not present

## 2014-09-19 DIAGNOSIS — I10 Essential (primary) hypertension: Secondary | ICD-10-CM | POA: Diagnosis not present

## 2014-09-19 DIAGNOSIS — Z7901 Long term (current) use of anticoagulants: Secondary | ICD-10-CM

## 2014-09-19 DIAGNOSIS — D649 Anemia, unspecified: Secondary | ICD-10-CM | POA: Diagnosis not present

## 2014-09-19 DIAGNOSIS — S36503S Unspecified injury of sigmoid colon, sequela: Secondary | ICD-10-CM | POA: Diagnosis not present

## 2014-09-19 DIAGNOSIS — E1140 Type 2 diabetes mellitus with diabetic neuropathy, unspecified: Secondary | ICD-10-CM | POA: Diagnosis not present

## 2014-09-19 DIAGNOSIS — M6281 Muscle weakness (generalized): Secondary | ICD-10-CM | POA: Diagnosis not present

## 2014-09-19 LAB — POCT INR: INR: 3.1

## 2014-09-25 ENCOUNTER — Ambulatory Visit (INDEPENDENT_AMBULATORY_CARE_PROVIDER_SITE_OTHER): Payer: Medicare Other | Admitting: Pharmacist

## 2014-09-25 DIAGNOSIS — M6281 Muscle weakness (generalized): Secondary | ICD-10-CM | POA: Diagnosis not present

## 2014-09-25 DIAGNOSIS — I10 Essential (primary) hypertension: Secondary | ICD-10-CM | POA: Diagnosis not present

## 2014-09-25 DIAGNOSIS — Z7901 Long term (current) use of anticoagulants: Secondary | ICD-10-CM

## 2014-09-25 DIAGNOSIS — Z933 Colostomy status: Secondary | ICD-10-CM | POA: Diagnosis not present

## 2014-09-25 DIAGNOSIS — K5660 Unspecified intestinal obstruction: Secondary | ICD-10-CM | POA: Diagnosis not present

## 2014-09-25 DIAGNOSIS — Z86711 Personal history of pulmonary embolism: Secondary | ICD-10-CM | POA: Diagnosis not present

## 2014-09-25 DIAGNOSIS — D649 Anemia, unspecified: Secondary | ICD-10-CM | POA: Diagnosis not present

## 2014-09-25 DIAGNOSIS — E1140 Type 2 diabetes mellitus with diabetic neuropathy, unspecified: Secondary | ICD-10-CM | POA: Diagnosis not present

## 2014-09-25 DIAGNOSIS — E119 Type 2 diabetes mellitus without complications: Secondary | ICD-10-CM | POA: Diagnosis not present

## 2014-09-25 DIAGNOSIS — K6389 Other specified diseases of intestine: Secondary | ICD-10-CM | POA: Diagnosis not present

## 2014-09-25 DIAGNOSIS — Z4801 Encounter for change or removal of surgical wound dressing: Secondary | ICD-10-CM | POA: Diagnosis not present

## 2014-09-25 DIAGNOSIS — S36503S Unspecified injury of sigmoid colon, sequela: Secondary | ICD-10-CM | POA: Diagnosis not present

## 2014-09-25 LAB — POCT INR: INR: 4.4

## 2014-09-30 DIAGNOSIS — R140 Abdominal distension (gaseous): Secondary | ICD-10-CM | POA: Diagnosis not present

## 2014-09-30 DIAGNOSIS — K5730 Diverticulosis of large intestine without perforation or abscess without bleeding: Secondary | ICD-10-CM | POA: Diagnosis not present

## 2014-10-01 ENCOUNTER — Other Ambulatory Visit: Payer: Self-pay | Admitting: Gastroenterology

## 2014-10-01 DIAGNOSIS — K5730 Diverticulosis of large intestine without perforation or abscess without bleeding: Secondary | ICD-10-CM

## 2014-10-03 ENCOUNTER — Ambulatory Visit (INDEPENDENT_AMBULATORY_CARE_PROVIDER_SITE_OTHER): Payer: Medicare Other | Admitting: *Deleted

## 2014-10-03 DIAGNOSIS — Z86711 Personal history of pulmonary embolism: Secondary | ICD-10-CM | POA: Diagnosis not present

## 2014-10-03 DIAGNOSIS — Z7901 Long term (current) use of anticoagulants: Secondary | ICD-10-CM

## 2014-10-03 DIAGNOSIS — I2699 Other pulmonary embolism without acute cor pulmonale: Secondary | ICD-10-CM | POA: Diagnosis not present

## 2014-10-03 LAB — POCT INR: INR: 3.5

## 2014-10-08 ENCOUNTER — Other Ambulatory Visit: Payer: Self-pay

## 2014-10-08 DIAGNOSIS — Z1231 Encounter for screening mammogram for malignant neoplasm of breast: Secondary | ICD-10-CM

## 2014-10-12 ENCOUNTER — Emergency Department (HOSPITAL_COMMUNITY)
Admission: EM | Admit: 2014-10-12 | Discharge: 2014-10-12 | Disposition: A | Discharge status: Home / Self Care | Payer: Medicare Other | Attending: Emergency Medicine | Admitting: Emergency Medicine

## 2014-10-12 ENCOUNTER — Encounter (HOSPITAL_COMMUNITY): Payer: Self-pay | Admitting: Emergency Medicine

## 2014-10-12 ENCOUNTER — Emergency Department (HOSPITAL_COMMUNITY): Payer: Medicare Other

## 2014-10-12 DIAGNOSIS — Z79899 Other long term (current) drug therapy: Secondary | ICD-10-CM | POA: Insufficient documentation

## 2014-10-12 DIAGNOSIS — E079 Disorder of thyroid, unspecified: Secondary | ICD-10-CM | POA: Insufficient documentation

## 2014-10-12 DIAGNOSIS — I10 Essential (primary) hypertension: Secondary | ICD-10-CM | POA: Insufficient documentation

## 2014-10-12 DIAGNOSIS — R109 Unspecified abdominal pain: Secondary | ICD-10-CM | POA: Diagnosis present

## 2014-10-12 DIAGNOSIS — E785 Hyperlipidemia, unspecified: Secondary | ICD-10-CM | POA: Diagnosis not present

## 2014-10-12 DIAGNOSIS — Z87891 Personal history of nicotine dependence: Secondary | ICD-10-CM | POA: Diagnosis not present

## 2014-10-12 DIAGNOSIS — E119 Type 2 diabetes mellitus without complications: Secondary | ICD-10-CM | POA: Diagnosis not present

## 2014-10-12 DIAGNOSIS — Z7901 Long term (current) use of anticoagulants: Secondary | ICD-10-CM | POA: Diagnosis not present

## 2014-10-12 DIAGNOSIS — R1033 Periumbilical pain: Secondary | ICD-10-CM | POA: Insufficient documentation

## 2014-10-12 DIAGNOSIS — K760 Fatty (change of) liver, not elsewhere classified: Secondary | ICD-10-CM | POA: Diagnosis not present

## 2014-10-12 DIAGNOSIS — R1032 Left lower quadrant pain: Secondary | ICD-10-CM | POA: Diagnosis not present

## 2014-10-12 LAB — CBC WITH DIFFERENTIAL/PLATELET
Basophils Absolute: 0 10*3/uL (ref 0.0–0.1)
Basophils Relative: 0 %
EOS PCT: 1 %
Eosinophils Absolute: 0.1 10*3/uL (ref 0.0–0.7)
HCT: 36.4 % (ref 36.0–46.0)
Hemoglobin: 11.6 g/dL — ABNORMAL LOW (ref 12.0–15.0)
LYMPHS ABS: 3.7 10*3/uL (ref 0.7–4.0)
LYMPHS PCT: 41 %
MCH: 28.5 pg (ref 26.0–34.0)
MCHC: 31.9 g/dL (ref 30.0–36.0)
MCV: 89.4 fL (ref 78.0–100.0)
MONO ABS: 0.5 10*3/uL (ref 0.1–1.0)
MONOS PCT: 5 %
Neutro Abs: 4.8 10*3/uL (ref 1.7–7.7)
Neutrophils Relative %: 53 %
PLATELETS: 301 10*3/uL (ref 150–400)
RBC: 4.07 MIL/uL (ref 3.87–5.11)
RDW: 14.8 % (ref 11.5–15.5)
WBC: 9 10*3/uL (ref 4.0–10.5)

## 2014-10-12 LAB — COMPREHENSIVE METABOLIC PANEL
ALT: 12 U/L — AB (ref 14–54)
ANION GAP: 12 (ref 5–15)
AST: 24 U/L (ref 15–41)
Albumin: 3.2 g/dL — ABNORMAL LOW (ref 3.5–5.0)
Alkaline Phosphatase: 48 U/L (ref 38–126)
BUN: <5 mg/dL — ABNORMAL LOW (ref 6–20)
CHLORIDE: 102 mmol/L (ref 101–111)
CO2: 25 mmol/L (ref 22–32)
CREATININE: 0.87 mg/dL (ref 0.44–1.00)
Calcium: 8.5 mg/dL — ABNORMAL LOW (ref 8.9–10.3)
Glucose, Bld: 139 mg/dL — ABNORMAL HIGH (ref 65–99)
POTASSIUM: 3.2 mmol/L — AB (ref 3.5–5.1)
Sodium: 139 mmol/L (ref 135–145)
Total Bilirubin: 0.8 mg/dL (ref 0.3–1.2)
Total Protein: 6.4 g/dL — ABNORMAL LOW (ref 6.5–8.1)

## 2014-10-12 LAB — LIPASE, BLOOD: LIPASE: 24 U/L (ref 22–51)

## 2014-10-12 LAB — CBG MONITORING, ED: Glucose-Capillary: 106 mg/dL — ABNORMAL HIGH (ref 65–99)

## 2014-10-12 LAB — PROTIME-INR
INR: 4.64 — ABNORMAL HIGH (ref 0.00–1.49)
PROTHROMBIN TIME: 42.5 s — AB (ref 11.6–15.2)

## 2014-10-12 MED ORDER — HYDROMORPHONE HCL 1 MG/ML IJ SOLN
1.0000 mg | Freq: Once | INTRAMUSCULAR | Status: AC
  Administered 2014-10-12: 1 mg via INTRAVENOUS
  Filled 2014-10-12: qty 1

## 2014-10-12 MED ORDER — ONDANSETRON HCL 4 MG/2ML IJ SOLN
4.0000 mg | Freq: Once | INTRAMUSCULAR | Status: AC
  Administered 2014-10-12: 4 mg via INTRAVENOUS
  Filled 2014-10-12: qty 2

## 2014-10-12 MED ORDER — HYDROMORPHONE HCL 1 MG/ML IJ SOLN
0.5000 mg | Freq: Once | INTRAMUSCULAR | Status: AC
  Administered 2014-10-12: 0.5 mg via INTRAVENOUS
  Filled 2014-10-12: qty 1

## 2014-10-12 MED ORDER — IOHEXOL 300 MG/ML  SOLN
25.0000 mL | Freq: Once | INTRAMUSCULAR | Status: AC | PRN
  Administered 2014-10-12: 25 mL via ORAL

## 2014-10-12 MED ORDER — IOHEXOL 300 MG/ML  SOLN
100.0000 mL | Freq: Once | INTRAMUSCULAR | Status: AC | PRN
  Administered 2014-10-12: 100 mL via INTRAVENOUS

## 2014-10-12 NOTE — ED Notes (Signed)
MD at bedside. 

## 2014-10-12 NOTE — ED Notes (Signed)
0.5 mg dilaudid wasted with RN Chrislyn King d/t pt no longer being in pixis at end of shift.  Pharmacy notified.

## 2014-10-12 NOTE — ED Notes (Signed)
Pt arrives from home c/o abdominal pain that woke her up at 0200.  Pt reports recent bleeding from colostomy incision, reports colostomy approx 2 months old.  Pt endorses nausea, denies vomiting.  NAD noted.

## 2014-10-12 NOTE — Discharge Instructions (Signed)

## 2014-10-12 NOTE — ED Notes (Signed)
0.5mg  Dilaudid wasted with Trinda Pascal, RN.

## 2014-10-12 NOTE — ED Provider Notes (Signed)
CSN: [id number]     Arrival date & time 10/12/14  0744 History   First MD Initiated Contact with Patient 10/12/14 0802     Chief Complaint  Patient presents with  . Abdominal Pain      HPI Patient was awakened around 2 AM with abdominal pain below the umbilicus.  Patient has history of colostomy which was placed in May.  Since then she did have some stool expelled from her rectal.  No vomiting.  Patient also has history of pulmonary embolism and is currently on anticoagulation therapy.  She also has an IVC filter placed.. Past Medical History  Diagnosis Date  . Thyroid disease   . Acute massive pulmonary embolism (HCC) 07/13/2012    Massive PE w/ PEA arrest 07/13/12 >TNK >IVC filter >discharged on comadin    . Diabetes mellitus, type 2 (HCC) 07/15/2012    with peripheral neuropathy  . Hypertension 07/14/2012  . Hyperlipemia 07/14/2012  . Large bowel stricture (HCC)     s/p colectomy in 2016  . Osteoarthritis     s/p hip and knee replacements   Past Surgical History  Procedure Laterality Date  . Sp arthro hip*l*    . Knee arthroscopy    . Insertion of vena cava filter N/A 07/16/2012    Procedure: INSERTION OF VENA CAVA FILTER;  Surgeon: [name], MD;  Location: [facility] CATH LAB;  Service: Cardiovascular;  Laterality: N/A;  . Flexible sigmoidoscopy N/A 05/30/2014    Procedure: FLEXIBLE SIGMOIDOSCOPY;  Surgeon: Jeani Hawking, MD;  Location: [facility] ENDOSCOPY;  Service: Endoscopy;  Laterality: N/A;  . Partial colectomy N/A 06/03/2014    Procedure: PARTIAL COLECTOMY;  Surgeon: Harriette Bouillon, MD;  Location: [facility] OR;  Service: General;  Laterality: N/A;  . Colostomy N/A 06/03/2014    Procedure: COLOSTOMY;  Surgeon: Harriette Bouillon, MD;  Location: [facility] OR;  Service: General;  Laterality: N/A;   No family history on file. Social History  Substance Use Topics  . Smoking status: Former Smoker -- 1.00 packs/day for 10 years    Types: Cigarettes    Quit date: 01/04/1968  . Smokeless tobacco: Never  Used  . Alcohol Use: No   OB History    No data available     Review of Systems All other systems reviewed and are negative   Allergies  Augmentin and Tranxene  Home Medications   Prior to Admission medications   Medication Sig Start Date End Date Taking? Authorizing Provider  acetaminophen (TYLENOL) 500 MG tablet Take 500 mg by mouth every 6 (six) hours as needed (pain).   Yes Historical Provider, MD  Cholecalciferol (VITAMIN D PO) Take 1 tablet by mouth daily.   Yes Historical Provider, MD  levothyroxine (SYNTHROID, LEVOTHROID) 25 MCG tablet Take 1 tablet by mouth  daily before breakfast 04/14/14  Yes [name], DO  metFORMIN (GLUCOPHAGE) 1000 MG tablet  (1 tablet) with breakfast and  (1 tablet) with dinner. Patient taking differently: Take 1,000 mg by mouth 2 (two) times daily with a meal.  (1 tablet) with breakfast and  (1 tablet) with dinner. 04/14/14  Yes [name], DO  metoprolol tartrate (LOPRESSOR) 25 MG tablet Take 1 tablet (25 mg total) by mouth 2 (two) times daily. 04/14/14  Yes [name], DO  ondansetron (ZOFRAN ODT) 4 MG disintegrating tablet Take 1 tablet (4 mg total) by mouth every 8 (eight) hours as needed for nausea or vomiting. 05/25/14  Yes Kristen N Ward, DO  simvastatin (ZOCOR) 20  MG tablet Take 1 tablet (20 mg total) by mouth at bedtime. 04/14/14  Yes [name], DO  vitamin C (ASCORBIC ACID) 500 MG tablet Take 500 mg by mouth daily.    Yes Historical Provider, MD  VITAMIN E PO Take 1 tablet by mouth daily.   Yes Historical Provider, MD  warfarin (COUMADIN) 5 MG tablet Take 2.5-5 mg by mouth See admin instructions. Take 1 tablet on Monday an Friday and take 1/2 tablet all other days   Yes Historical Provider, MD  glucose blood (ONETOUCH VERIO) test strip 1 each by Other route daily. Dx E11.9    Historical Provider, MD  [facility] DELICA LANCETS FINE MISC 1 each by Does not apply route daily. E11.9    Historical Provider, MD   BP  178/78 mmHg  Pulse 77  Temp(Src) 97.4 F (36.3 C) (Oral)  Resp 18  Ht 5\' 7"  (1.702 m)  Wt 182 lb (82.555 kg)  BMI 28.50 kg/m2  SpO2 100% Physical Exam Physical Exam  Nursing note and vitals reviewed. Constitutional: She is oriented to person, place, and time. She appears well-developed and well-nourished. No distress.  HENT:  Head: Normocephalic and atraumatic.  Eyes: Pupils are equal, round, and reactive to light.  Neck: Normal range of motion.  Cardiovascular: Normal rate and intact distal pulses.   Pulmonary/Chest: No respiratory distress.  Abdominal: Normal appearance.  Patient has abdominal tenderness below the umbilicus.  Has colostomy bag left lower quadrant which is draining.  Bowel sounds are decreased but active.  No definite rebound tenderness. Musculoskeletal: Normal range of motion.  Neurological: She is alert and oriented to person, place, and time. No cranial nerve deficit.  Skin: Skin is warm and dry. No rash noted.  Psychiatric: She has a normal mood and affect. Her behavior is normal.   ED Course  Procedures (including critical care time) Medications  HYDROmorphone (DILAUDID) injection 0.5 mg (not administered)  HYDROmorphone (DILAUDID) injection 1 mg (1 mg Intravenous Given 10/12/14 0906)  ondansetron (ZOFRAN) injection 4 mg (4 mg Intravenous Given 10/12/14 0905)  HYDROmorphone (DILAUDID) injection 1 mg (1 mg Intravenous Given 10/12/14 1037)  iohexol (OMNIPAQUE) 300 MG/ML solution 25 mL (25 mLs Oral Contrast Given 10/12/14 1036)  iohexol (OMNIPAQUE) 300 MG/ML solution 100 mL (100 mLs Intravenous Contrast Given 10/12/14 1126)    Labs Review Labs Reviewed  CBC WITH DIFFERENTIAL/PLATELET - Abnormal; Notable for the following:    Hemoglobin 11.6 (*)    All other components within normal limits  COMPREHENSIVE METABOLIC PANEL - Abnormal; Notable for the following:    Potassium 3.2 (*)    Glucose, Bld 139 (*)    BUN <5 (*)    Calcium 8.5 (*)    Total Protein 6.4  (*)    Albumin 3.2 (*)    ALT 12 (*)    All other components within normal limits  PROTIME-INR - Abnormal; Notable for the following:    Prothrombin Time 42.5 (*)    INR 4.64 (*)    All other components within normal limits  CBG MONITORING, ED - Abnormal; Notable for the following:    Glucose-Capillary 106 (*)    All other components within normal limits  LIPASE, BLOOD    Imaging Review Ct Abdomen Pelvis W Contrast  10/12/2014   CLINICAL DATA:  74 year old who was awakened from sleep at 0200 hr this morning due to severe left-sided abdominal pain. 2 months post sigmoid colectomy and descending colostomy related to a sigmoid colon stricture secondary to  diverticulitis. Patient also has nausea and has had bleeding from the ostomy site recently.  EXAM: CT ABDOMEN AND PELVIS WITH CONTRAST  TECHNIQUE: Multidetector CT imaging of the abdomen and pelvis was performed using the standard protocol following bolus administration of intravenous contrast.  CONTRAST:  OMNIPAQUE IOHEXOL 300 MG/ML IV. Oral contrast was also administered.  COMPARISON:  06/07/2014 dating back to 04/24/2008.  FINDINGS: Hepatobiliary: Mild diffuse hepatic steatosis without focal hepatic parenchymal abnormality. Anatomic variant in that the left lobe extends well across the midline in the left upper quadrant. Normal-appearing gallbladder without calcified gallstones. No biliary ductal dilation.  Spleen:  Normal in size and appearance.  Pancreas:  Normal in appearance.  No pancreatic ductal dilation.  Adrenal glands: Mild bilateral adrenal enlargement without nodularity, unchanged dating back to 2010.  Genitourinary: Approximate 6 mm cortical cyst arising from the upper pole of the left kidney, unchanged. No significant abnormality involving either kidney. No hydronephrosis. No visible opaque urinary tract calculi. Urinary bladder unremarkable.  Uterus not visualized and presumed surgically absent. No adnexal masses.   Gastrointestinal: Satisfactory appearance of the descending colostomy in the left mid abdomen. Decompressed Hartmann pouch. Diverticulosis involving the descending colon without evidence of acute diverticulitis. Remainder of the colon normal in appearance. Normal appearing appendix in the right mid pelvis.  Normal-appearing stomach and small bowel.  Ascites:  Absent.  Vascular: Moderate aortoiliofemoral atherosclerosis without aneurysm. Visceral arteries atherosclerotic though patent. IVC filter in the infrarenal IVC.  Lymphatic:  No pathologic lymphadenopathy in the abdomen or pelvis.  Other findings: Midline infraumbilical surgical scar with dense scarring in the periumbilical region.  Musculoskeletal: Prior left total hip arthroplasty with anatomic alignment. For lower thoracic and upper lumbar spondylosis. Degenerative disc disease at L5-S1. Severe facet degenerative changes throughout the lumbar spine. Osseous demineralization. Degenerative changes involving the sacroiliac joints.  Visualized lower thorax: Heart mildly enlarged. Visualized lung bases clear apart from scarring deep in the medial right lower lobe and scarring in the lingula.  IMPRESSION: 1. No acute abnormalities involving the abdomen or pelvis. 2. Satisfactory appearance of the descending colostomy. Satisfactory appearance of the decompressed Hartmann pouch. 3. Mild diffuse hepatic steatosis without focal hepatic parenchymal abnormality. 4. Mild bilateral adrenal hyperplasia, unchanged.   Electronically Signed   By: Hulan Saas M.D.   On: 10/12/2014 12:09   Dg Abd Acute W/chest  10/12/2014   CLINICAL DATA:  Acute left lower quadrant abdominal pain.  EXAM: DG ABDOMEN ACUTE W/ 1V CHEST  COMPARISON:  May 27, 2014.  FINDINGS: There is no evidence of dilated bowel loops or free intraperitoneal air. No radiopaque calculi are seen. IVC filter is noted. Status post left hip arthroplasty. Heart size and mediastinal contours are within normal  limits. Both lungs are clear.  IMPRESSION: No evidence of bowel obstruction or ileus. No acute cardiopulmonary disease.   Electronically Signed   By: Lupita Raider, M.D.   On: 10/12/2014 10:07   I have personally reviewed and evaluated these images and lab results as part of my medical decision-making.    MDM   Final diagnoses:  Abdominal pain, unspecified abdominal location        Nelva Nay, MD 10/12/14 1248

## 2014-10-12 NOTE — ED Notes (Signed)
Patient transported to X-ray 

## 2014-10-13 ENCOUNTER — Ambulatory Visit (INDEPENDENT_AMBULATORY_CARE_PROVIDER_SITE_OTHER): Payer: Medicare Other

## 2014-10-13 DIAGNOSIS — I2699 Other pulmonary embolism without acute cor pulmonale: Secondary | ICD-10-CM | POA: Diagnosis not present

## 2014-10-13 DIAGNOSIS — Z933 Colostomy status: Secondary | ICD-10-CM | POA: Diagnosis not present

## 2014-10-13 DIAGNOSIS — Z7901 Long term (current) use of anticoagulants: Secondary | ICD-10-CM | POA: Diagnosis not present

## 2014-10-13 DIAGNOSIS — Z86711 Personal history of pulmonary embolism: Secondary | ICD-10-CM | POA: Diagnosis not present

## 2014-10-13 LAB — POCT INR: INR: 3.6

## 2014-10-15 ENCOUNTER — Other Ambulatory Visit: Payer: Self-pay | Admitting: Surgery

## 2014-10-15 DIAGNOSIS — Z933 Colostomy status: Secondary | ICD-10-CM

## 2014-10-17 ENCOUNTER — Telehealth: Payer: Self-pay | Admitting: Family Medicine

## 2014-10-17 ENCOUNTER — Ambulatory Visit
Admission: RE | Admit: 2014-10-17 | Discharge: 2014-10-17 | Disposition: A | Discharge status: Home / Self Care | Payer: Medicare Other | Source: Ambulatory Visit

## 2014-10-17 DIAGNOSIS — E785 Hyperlipidemia, unspecified: Secondary | ICD-10-CM

## 2014-10-17 DIAGNOSIS — Z1231 Encounter for screening mammogram for malignant neoplasm of breast: Secondary | ICD-10-CM

## 2014-10-17 NOTE — Telephone Encounter (Signed)
Pt need new rx simvastatin #30 , levothyroxine#30 and metoprolol 25 mg #30 w/refills  send to [facility]. Pt no longer getting med from [facility]

## 2014-10-21 MED ORDER — METOPROLOL TARTRATE 25 MG PO TABS: 25.0000 mg | ORAL_TABLET | Freq: Two times a day (BID) | ORAL | Status: DC

## 2014-10-21 MED ORDER — SIMVASTATIN 20 MG PO TABS: 20.0000 mg | ORAL_TABLET | Freq: Every day | ORAL | Status: DC

## 2014-10-21 MED ORDER — LEVOTHYROXINE SODIUM 25 MCG PO TABS: ORAL_TABLET | ORAL | Status: DC

## 2014-10-21 NOTE — Telephone Encounter (Signed)
Rxs done. 

## 2014-10-22 ENCOUNTER — Telehealth: Payer: Self-pay | Admitting: *Deleted

## 2014-10-22 NOTE — Telephone Encounter (Signed)
Hannah [name] called from [facility] stating she is requesting an order she faxed in August for osteotomy supplies.  I advised Hannah [name] this was not completed by Dr [name] and I recall faxing the form back to their office stating this as Dr [name] would not sign the orders as this should come from the surgeon.  Hannah [name] stated she talked with someone here named [name] who verified the pt was [name] pt and that she gave a verbal order for this.  I advised her [name] does not give verbal orders but would verify the pt is a patient of [name] and I advised her again per Dr [name] the order would need to come from the surgeon and the pts home health agency was informed of this as well.

## 2014-10-23 ENCOUNTER — Ambulatory Visit (INDEPENDENT_AMBULATORY_CARE_PROVIDER_SITE_OTHER): Payer: Medicare Other

## 2014-10-23 DIAGNOSIS — Z933 Colostomy status: Secondary | ICD-10-CM | POA: Diagnosis not present

## 2014-10-23 DIAGNOSIS — Z86711 Personal history of pulmonary embolism: Secondary | ICD-10-CM

## 2014-10-23 DIAGNOSIS — Z4801 Encounter for change or removal of surgical wound dressing: Secondary | ICD-10-CM | POA: Diagnosis not present

## 2014-10-23 DIAGNOSIS — Z7901 Long term (current) use of anticoagulants: Secondary | ICD-10-CM | POA: Diagnosis not present

## 2014-10-23 DIAGNOSIS — E119 Type 2 diabetes mellitus without complications: Secondary | ICD-10-CM | POA: Diagnosis not present

## 2014-10-23 DIAGNOSIS — I2699 Other pulmonary embolism without acute cor pulmonale: Secondary | ICD-10-CM | POA: Diagnosis not present

## 2014-10-23 DIAGNOSIS — K5660 Unspecified intestinal obstruction: Secondary | ICD-10-CM | POA: Diagnosis not present

## 2014-10-23 LAB — POCT INR: INR: 1.9

## 2014-10-28 ENCOUNTER — Telehealth: Payer: Self-pay | Admitting: Family Medicine

## 2014-10-28 NOTE — Telephone Encounter (Addendum)
error 

## 2014-11-06 ENCOUNTER — Ambulatory Visit (INDEPENDENT_AMBULATORY_CARE_PROVIDER_SITE_OTHER): Payer: Medicare Other | Admitting: *Deleted

## 2014-11-06 DIAGNOSIS — I2699 Other pulmonary embolism without acute cor pulmonale: Secondary | ICD-10-CM | POA: Diagnosis not present

## 2014-11-06 DIAGNOSIS — Z86711 Personal history of pulmonary embolism: Secondary | ICD-10-CM | POA: Diagnosis not present

## 2014-11-06 DIAGNOSIS — Z7901 Long term (current) use of anticoagulants: Secondary | ICD-10-CM | POA: Diagnosis not present

## 2014-11-06 LAB — POCT INR: INR: 3.3

## 2014-11-12 ENCOUNTER — Ambulatory Visit (INDEPENDENT_AMBULATORY_CARE_PROVIDER_SITE_OTHER): Payer: Medicare Other | Admitting: Family Medicine

## 2014-11-12 ENCOUNTER — Encounter: Payer: Self-pay | Admitting: Family Medicine

## 2014-11-12 VITALS — BP 124/82 | HR 76 | Temp 97.9°F | Ht 67.0 in | Wt 168.0 lb

## 2014-11-12 DIAGNOSIS — Z86711 Personal history of pulmonary embolism: Secondary | ICD-10-CM

## 2014-11-12 DIAGNOSIS — I10 Essential (primary) hypertension: Secondary | ICD-10-CM

## 2014-11-12 DIAGNOSIS — E1140 Type 2 diabetes mellitus with diabetic neuropathy, unspecified: Secondary | ICD-10-CM | POA: Diagnosis not present

## 2014-11-12 DIAGNOSIS — E038 Other specified hypothyroidism: Secondary | ICD-10-CM | POA: Diagnosis not present

## 2014-11-12 DIAGNOSIS — Z23 Encounter for immunization: Secondary | ICD-10-CM

## 2014-11-12 DIAGNOSIS — E785 Hyperlipidemia, unspecified: Secondary | ICD-10-CM | POA: Diagnosis not present

## 2014-11-12 DIAGNOSIS — M159 Polyosteoarthritis, unspecified: Secondary | ICD-10-CM

## 2014-11-12 DIAGNOSIS — Z7901 Long term (current) use of anticoagulants: Secondary | ICD-10-CM

## 2014-11-12 MED ORDER — GLUCOSE BLOOD VI STRP
ORAL_STRIP | Status: DC
Start: 2014-11-12 — End: 2015-02-12

## 2014-11-12 NOTE — Progress Notes (Signed)
HPI:  Hannah [name] is a 74 yo F with PMH sig for thromboembolism, Thyroid disease, HLD, HTN, DM here for  follow up.   Hypothyroid:  -on synthroid chronically  -low dose  -denies: hot/cold intol, palpitations, skin or nail changes  DM:  -home BS: good before ran out of strips -current meds:metformin  bid used to take glipizide  daily - now not taking -denies: polyuria, polydipsia, hypoglycemia, foot ulcers, changes in vision    Hx PE and cardiogenic shock, HTN, HLD:  -followed by Dr. Tenny Craw in Cardiology  -on coumadin managed by cards coumadin clinic, statin, metoprolol  -denies: CP, SOB, swelling, palpitations, DOE, orthopnea   Deconditioning, s/p bowel resection for bowel stricture the spring of 2016: -complicated by postoperative ileus and surgical wound dinfection w/ temporary need for TPN per notes -s/p PT/OT  -surgeon was Dr. Mignon Pine -she lives alone, but has lifesline and family members whom check on her daily and help with meals -she can walk with her cane and reports her strength has returned and she is feeling much better  Knee and Hip OA s/p hip and knee replacements: -uses [employer] transportation -Saw Dr. Luiz Blare at [facility] orthopedics in the past -has long hx of severe pain in hips and knees when walks more then a few blocks or stands for long periods of time, has difficulty boarding a bus or walking ups hills -uses walker and cane to ambulate   ROS: See pertinent positives and negatives per HPI.  Past Medical History  Diagnosis Date  . Thyroid disease   . Acute massive pulmonary embolism (HCC) 07/13/2012    Massive PE w/ PEA arrest 07/13/12 >TNK >IVC filter >discharged on comadin    . Diabetes mellitus, type 2 (HCC) 07/15/2012    with peripheral neuropathy  . Hypertension 07/14/2012  . Hyperlipemia 07/14/2012  . Large bowel stricture (HCC)     s/p colectomy in 2016  . Osteoarthritis     s/p hip and knee replacements    Past Surgical  History  Procedure Laterality Date  . Sp arthro hip*l*    . Knee arthroscopy    . Insertion of vena cava filter N/A 07/16/2012    Procedure: INSERTION OF VENA CAVA FILTER;  Surgeon: [name], MD;  Location: [facility] CATH LAB;  Service: Cardiovascular;  Laterality: N/A;  . Flexible sigmoidoscopy N/A 05/30/2014    Procedure: FLEXIBLE SIGMOIDOSCOPY;  Surgeon: Jeani Hawking, MD;  Location: [facility] ENDOSCOPY;  Service: Endoscopy;  Laterality: N/A;  . Partial colectomy N/A 06/03/2014    Procedure: PARTIAL COLECTOMY;  Surgeon: Harriette Bouillon, MD;  Location: [facility] OR;  Service: General;  Laterality: N/A;  . Colostomy N/A 06/03/2014    Procedure: COLOSTOMY;  Surgeon: Harriette Bouillon, MD;  Location: [facility] OR;  Service: General;  Laterality: N/A;    No family history on file.  Social History   Social History  . Marital Status: Widowed    Spouse Name: N/A  . Number of Children: N/A  . Years of Education: N/A   Social History Main Topics  . Smoking status: Former Smoker -- 1.00 packs/day for 10 years    Types: Cigarettes    Quit date: 01/04/1968  . Smokeless tobacco: Never Used  . Alcohol Use: No  . Drug Use: No  . Sexual Activity: Not Asked   Other Topics Concern  . None   Social History Narrative   Lives alone.            Current outpatient prescriptions:  .  acetaminophen (TYLENOL) 500 MG tablet, Take 500 mg by mouth every 6 (six) hours as needed (pain)., Disp: , Rfl:  .  Cholecalciferol (VITAMIN D PO), Take 1 tablet by mouth daily., Disp: , Rfl:  .  glucose blood (ONETOUCH VERIO) test strip, 1 each by Other route daily. Dx E11.9, Disp: , Rfl:  .  levothyroxine (SYNTHROID, LEVOTHROID) 25 MCG tablet, Take 1 tablet by mouth  daily before breakfast, Disp: 30 tablet, Rfl: 3 .  metFORMIN (GLUCOPHAGE) 1000 MG tablet, 1000mg  (1 tablet) with breakfast and 1000mg  (1 tablet) with dinner. (Patient taking differently: Take 1,000 mg by mouth 2 (two) times daily with a meal. 1000mg  (1 tablet) with breakfast  and 1000mg  (1 tablet) with dinner.), Disp: 180 tablet, Rfl: 3 .  metoprolol tartrate (LOPRESSOR) 25 MG tablet, Take 1 tablet (25 mg total) by mouth 2 (two) times daily., Disp: 60 tablet, Rfl: 3 .  ondansetron (ZOFRAN ODT) 4 MG disintegrating tablet, Take 1 tablet (4 mg total) by mouth every 8 (eight) hours as needed for nausea or vomiting., Disp: 20 tablet, Rfl: 0 .  ONETOUCH DELICA LANCETS FINE MISC, 1 each by Does not apply route daily. E11.9, Disp: , Rfl:  .  simvastatin (ZOCOR) 20 MG tablet, Take 1 tablet (20 mg total) by mouth at bedtime., Disp: 30 tablet, Rfl: 3 .  vitamin C (ASCORBIC ACID) 500 MG tablet, Take 500 mg by mouth daily. , Disp: , Rfl:  .  VITAMIN E PO, Take 1 tablet by mouth daily., Disp: , Rfl:  .  warfarin (COUMADIN) 5 MG tablet, Take 2.5-5 mg by mouth See admin instructions. Take 1 tablet on Monday an Friday and take 1/2 tablet all other days, Disp: , Rfl:  .  glucose blood (ONETOUCH VERIO) test strip, Use as instructed to check blood sugar once a day, Disp: 100 each, Rfl: 1  EXAM:  Filed Vitals:   11/12/14 0910  BP: 124/82  Pulse: 76  Temp: 97.9 F (36.6 C)    Body mass index is 26.31 kg/(m^2).  GENERAL: vitals reviewed and listed above, alert, oriented, appears well hydrated and in no acute distress  HEENT: atraumatic, conjunttiva clear, no obvious abnormalities on inspection of external nose and ears  NECK: no obvious masses on inspection  LUNGS: clear to auscultation bilaterally, no wheezes, rales or rhonchi, good air movement  CV: HRRR, no peripheral edema  MS: moves all extremities without noticeable abnormality  PSYCH: pleasant and cooperative, no obvious depression or anxiety  ASSESSMENT AND PLAN:  Discussed the following assessment and plan:  Controlled type 2 diabetes mellitus with diabetic neuropathy, without long-term current use of insulin (HCC)  Essential hypertension  Other specified hypothyroidism  Hyperlipemia  Long term current  use of anticoagulant therapy  Osteoarthritis of multiple joints, unspecified osteoarthritis type  History of pulmonary embolism  -reports she is feeling well -opted to check labs next visit -flu shot -advised staff to refill test strips -Patient advised to return or notify a doctor immediately if symptoms worsen or persist or new concerns arise.  Patient Instructions  BEFORE YOU LEAVE: -flu shot -Follow up in 3 months, come fasting and we will plan to check labs then  Check on the cost of the shingles vaccine and let us know if you want to do this at your next appointment  We recommend the following healthy lifestyle measures: - eat a healthy whole foods diet consisting of regular small meals composed of vegetables, fruits, beans, nuts, seeds, healthy meats such as  white chicken and fish and whole grains.  - avoid sweets, white starchy foods, fried foods, fast food, processed foods, sodas, red meet and other fattening foods.  - get a least 150-300 minutes of aerobic exercise per week.       Colin Benton R.

## 2014-11-12 NOTE — Progress Notes (Signed)
Pre visit review using our clinic review tool, if applicable. No additional management support is needed unless otherwise documented below in the visit note. 

## 2014-11-12 NOTE — Patient Instructions (Signed)
BEFORE YOU LEAVE: -flu shot -Follow up in 3 months, come fasting and we will plan to check labs then  Check on the cost of the shingles vaccine and let us know if you want to do this at your next appointment  We recommend the following healthy lifestyle measures: - eat a healthy whole foods diet consisting of regular small meals composed of vegetables, fruits, beans, nuts, seeds, healthy meats such as white chicken and fish and whole grains.  - avoid sweets, white starchy foods, fried foods, fast food, processed foods, sodas, red meet and other fattening foods.  - get a least 150-300 minutes of aerobic exercise per week.

## 2014-11-13 ENCOUNTER — Other Ambulatory Visit: Payer: Medicare Other

## 2014-11-24 ENCOUNTER — Ambulatory Visit (INDEPENDENT_AMBULATORY_CARE_PROVIDER_SITE_OTHER): Payer: Medicare Other | Admitting: Pharmacist

## 2014-11-24 DIAGNOSIS — I2699 Other pulmonary embolism without acute cor pulmonale: Secondary | ICD-10-CM | POA: Diagnosis not present

## 2014-11-24 DIAGNOSIS — Z7901 Long term (current) use of anticoagulants: Secondary | ICD-10-CM | POA: Diagnosis not present

## 2014-11-24 DIAGNOSIS — Z86711 Personal history of pulmonary embolism: Secondary | ICD-10-CM

## 2014-11-24 LAB — POCT INR: INR: 2.6

## 2014-11-25 ENCOUNTER — Ambulatory Visit (HOSPITAL_COMMUNITY): Admission: RE | Admit: 2014-11-25 | Payer: Medicare Other | Source: Ambulatory Visit | Admitting: Gastroenterology

## 2014-11-25 ENCOUNTER — Encounter (HOSPITAL_COMMUNITY): Admission: RE | Payer: Self-pay | Source: Ambulatory Visit

## 2014-11-25 SURGERY — COLONOSCOPY WITH PROPOFOL
Anesthesia: Monitor Anesthesia Care

## 2014-12-04 ENCOUNTER — Encounter: Payer: Self-pay | Admitting: Surgery

## 2014-12-04 DIAGNOSIS — E119 Type 2 diabetes mellitus without complications: Secondary | ICD-10-CM | POA: Diagnosis not present

## 2014-12-04 DIAGNOSIS — K5660 Unspecified intestinal obstruction: Secondary | ICD-10-CM | POA: Diagnosis not present

## 2014-12-04 DIAGNOSIS — Z4801 Encounter for change or removal of surgical wound dressing: Secondary | ICD-10-CM | POA: Diagnosis not present

## 2014-12-04 DIAGNOSIS — Z933 Colostomy status: Secondary | ICD-10-CM | POA: Diagnosis not present

## 2014-12-12 DIAGNOSIS — Z4801 Encounter for change or removal of surgical wound dressing: Secondary | ICD-10-CM | POA: Diagnosis not present

## 2014-12-12 DIAGNOSIS — E119 Type 2 diabetes mellitus without complications: Secondary | ICD-10-CM | POA: Diagnosis not present

## 2014-12-12 DIAGNOSIS — Z933 Colostomy status: Secondary | ICD-10-CM | POA: Diagnosis not present

## 2014-12-12 DIAGNOSIS — K5660 Unspecified intestinal obstruction: Secondary | ICD-10-CM | POA: Diagnosis not present

## 2014-12-22 ENCOUNTER — Ambulatory Visit (INDEPENDENT_AMBULATORY_CARE_PROVIDER_SITE_OTHER): Payer: Medicare Other

## 2014-12-22 DIAGNOSIS — I2699 Other pulmonary embolism without acute cor pulmonale: Secondary | ICD-10-CM | POA: Diagnosis not present

## 2014-12-22 DIAGNOSIS — Z86711 Personal history of pulmonary embolism: Secondary | ICD-10-CM | POA: Diagnosis not present

## 2014-12-22 DIAGNOSIS — Z7901 Long term (current) use of anticoagulants: Secondary | ICD-10-CM | POA: Diagnosis not present

## 2014-12-22 LAB — POCT INR: INR: 2.4

## 2015-01-19 ENCOUNTER — Ambulatory Visit (INDEPENDENT_AMBULATORY_CARE_PROVIDER_SITE_OTHER): Payer: Medicare Other | Admitting: Pharmacist

## 2015-01-19 DIAGNOSIS — Z86711 Personal history of pulmonary embolism: Secondary | ICD-10-CM | POA: Diagnosis not present

## 2015-01-19 DIAGNOSIS — Z7901 Long term (current) use of anticoagulants: Secondary | ICD-10-CM

## 2015-01-19 DIAGNOSIS — I2699 Other pulmonary embolism without acute cor pulmonale: Secondary | ICD-10-CM | POA: Diagnosis not present

## 2015-01-19 LAB — POCT INR: INR: 1.5

## 2015-01-19 MED ORDER — WARFARIN SODIUM 5 MG PO TABS: 2.5000 mg | ORAL_TABLET | Freq: Every day | ORAL | Status: DC

## 2015-01-30 DIAGNOSIS — Z933 Colostomy status: Secondary | ICD-10-CM | POA: Diagnosis not present

## 2015-01-30 DIAGNOSIS — Z4801 Encounter for change or removal of surgical wound dressing: Secondary | ICD-10-CM | POA: Diagnosis not present

## 2015-01-30 DIAGNOSIS — E119 Type 2 diabetes mellitus without complications: Secondary | ICD-10-CM | POA: Diagnosis not present

## 2015-01-30 DIAGNOSIS — K5660 Unspecified intestinal obstruction: Secondary | ICD-10-CM | POA: Diagnosis not present

## 2015-02-02 ENCOUNTER — Ambulatory Visit (INDEPENDENT_AMBULATORY_CARE_PROVIDER_SITE_OTHER): Payer: Medicare Other | Admitting: Pharmacist

## 2015-02-02 DIAGNOSIS — Z86711 Personal history of pulmonary embolism: Secondary | ICD-10-CM

## 2015-02-02 DIAGNOSIS — I2699 Other pulmonary embolism without acute cor pulmonale: Secondary | ICD-10-CM

## 2015-02-02 DIAGNOSIS — Z7901 Long term (current) use of anticoagulants: Secondary | ICD-10-CM

## 2015-02-02 LAB — POCT INR: INR: 1.6

## 2015-02-12 ENCOUNTER — Ambulatory Visit (INDEPENDENT_AMBULATORY_CARE_PROVIDER_SITE_OTHER): Payer: Medicare Other | Admitting: Family Medicine

## 2015-02-12 ENCOUNTER — Encounter: Payer: Self-pay | Admitting: Family Medicine

## 2015-02-12 VITALS — BP 120/80 | HR 66 | Temp 97.6°F | Ht 67.0 in | Wt 174.8 lb

## 2015-02-12 DIAGNOSIS — E038 Other specified hypothyroidism: Secondary | ICD-10-CM

## 2015-02-12 DIAGNOSIS — D649 Anemia, unspecified: Secondary | ICD-10-CM

## 2015-02-12 DIAGNOSIS — I2699 Other pulmonary embolism without acute cor pulmonale: Secondary | ICD-10-CM | POA: Diagnosis not present

## 2015-02-12 DIAGNOSIS — G629 Polyneuropathy, unspecified: Secondary | ICD-10-CM | POA: Diagnosis not present

## 2015-02-12 DIAGNOSIS — E46 Unspecified protein-calorie malnutrition: Secondary | ICD-10-CM

## 2015-02-12 DIAGNOSIS — E1140 Type 2 diabetes mellitus with diabetic neuropathy, unspecified: Secondary | ICD-10-CM | POA: Diagnosis not present

## 2015-02-12 DIAGNOSIS — I10 Essential (primary) hypertension: Secondary | ICD-10-CM | POA: Diagnosis not present

## 2015-02-12 DIAGNOSIS — E785 Hyperlipidemia, unspecified: Secondary | ICD-10-CM

## 2015-02-12 HISTORY — DX: Polyneuropathy, unspecified: G62.9

## 2015-02-12 LAB — LIPID PANEL
CHOLESTEROL: 154 mg/dL (ref 0–200)
HDL: 66.2 mg/dL (ref >39.00)
LDL CALC: 76 mg/dL (ref 0–99)
NonHDL: 87.55
Total CHOL/HDL Ratio: 2
Triglycerides: 58 mg/dL (ref 0.0–149.0)
VLDL: 11.6 mg/dL (ref 0.0–40.0)

## 2015-02-12 LAB — BASIC METABOLIC PANEL
BUN: 16 mg/dL (ref 6–23)
CHLORIDE: 107 meq/L (ref 96–112)
CO2: 28 mEq/L (ref 19–32)
Calcium: 9.6 mg/dL (ref 8.4–10.5)
Creatinine, Ser: 0.93 mg/dL (ref 0.40–1.20)
GFR: 75.84 mL/min (ref >60.00)
Glucose, Bld: 115 mg/dL — ABNORMAL HIGH (ref 70–99)
POTASSIUM: 3.7 meq/L (ref 3.5–5.1)
SODIUM: 141 meq/L (ref 135–145)

## 2015-02-12 LAB — HEMOGLOBIN A1C: Hgb A1c MFr Bld: 6.1 % (ref 4.6–6.5)

## 2015-02-12 LAB — TSH: TSH: 1.18 u[IU]/mL (ref 0.35–4.50)

## 2015-02-12 MED ORDER — GLUCOSE BLOOD VI STRP: ORAL_STRIP | Status: DC

## 2015-02-12 NOTE — Progress Notes (Signed)
Pre visit review using our clinic review tool, if applicable. No additional management support is needed unless otherwise documented below in the visit note. 

## 2015-02-12 NOTE — Progress Notes (Signed)
HPI:  Hannah [name] is a pleasant  75 yo F with a very complicated medical history here for follow up:  Hypothyroid:  -on synthroid chronically  -low dose  -denies: hot/cold intol, palpitations, skin or nail changes  DM w/ neuropathy:  -reports needs lancets -current meds:metformin 1000mg  bid used to take glipizide  - now not taking -denies: polyuria, polydipsia, hypoglycemia, foot ulcers, changes in vision   -see podiatrist, not happy with doc she saw - is going to try a different doc at that office  Hx PE and cardiogenic shock, HTN, HLD:  -followed by Dr. Tenny Craw in Cardiology  -on coumadin managed by cards coumadin clinic, statin, metoprolol  -denies: CP, SOB, swelling, palpitations, DOE, orthopnea   Deconditioning, Malnutrition/s/p bowel resection for bowel stricture the spring of 2016: -complicated by postoperative ileus and surgical wound dinfection w/ temporary need for TPN per notes, malnutrition -s/p PT/OT  -surgeon was Dr. Mignon Pine -she lives alone, but has lifesline and family members whom check on her daily and help with meals -reports appetite improved, doing much better an dstrength back  Knee and Hip OA s/p hip and knee replacements: -uses [employer] transportation -Saw Dr. Luiz Blare at [facility] orthopedics in the past -has long hx of severe pain in hips and knees when walks more then a few blocks or stands for long periods of time, has difficulty boarding a bus or walking ups hills -uses walker and cane to ambulate  ROS: See pertinent positives and negatives per HPI.  Past Medical History  Diagnosis Date  . Thyroid disease   . Acute massive pulmonary embolism (HCC) 07/13/2012    Massive PE w/ PEA arrest 07/13/12 >TNK >IVC filter >discharged on comadin    . Diabetes mellitus, type 2 (HCC) 07/15/2012    with peripheral neuropathy  . Hypertension 07/14/2012  . Hyperlipemia 07/14/2012  . Large bowel stricture (HCC)     s/p colectomy in 2016  . Osteoarthritis      s/p hip and knee replacements    Past Surgical History  Procedure Laterality Date  . Sp arthro hip*l*    . Knee arthroscopy    . Insertion of vena cava filter N/A 07/16/2012    Procedure: INSERTION OF VENA CAVA FILTER;  Surgeon: [name], MD;  Location: [facility] CATH LAB;  Service: Cardiovascular;  Laterality: N/A;  . Flexible sigmoidoscopy N/A 05/30/2014    Procedure: FLEXIBLE SIGMOIDOSCOPY;  Surgeon: Jeani Hawking, MD;  Location: [facility] ENDOSCOPY;  Service: Endoscopy;  Laterality: N/A;  . Partial colectomy N/A 06/03/2014    Procedure: PARTIAL COLECTOMY;  Surgeon: Harriette Bouillon, MD;  Location: [facility] OR;  Service: General;  Laterality: N/A;  . Colostomy N/A 06/03/2014    Procedure: COLOSTOMY;  Surgeon: Harriette Bouillon, MD;  Location: [facility] OR;  Service: General;  Laterality: N/A;    No family history on file.  Social History   Social History  . Marital Status: Widowed    Spouse Name: N/A  . Number of Children: N/A  . Years of Education: N/A   Social History Main Topics  . Smoking status: Former Smoker -- 1.00 packs/day for 10 years    Types: Cigarettes    Quit date: 01/04/1968  . Smokeless tobacco: Never Used  . Alcohol Use: No  . Drug Use: No  . Sexual Activity: Not Asked   Other Topics Concern  . None   Social History Narrative   Lives alone.            Current outpatient prescriptions:  .  acetaminophen (TYLENOL) 500 MG tablet, Take 500 mg by mouth every 6 (six) hours as needed (pain)., Disp: , Rfl:  .  Cholecalciferol (VITAMIN D PO), Take 1 tablet by mouth daily., Disp: , Rfl:  .  glucose blood (ONETOUCH VERIO) test strip, 1 each by Other route daily. Dx E11.9, Disp: , Rfl:  .  glucose blood (ONETOUCH VERIO) test strip, Use as instructed to check blood sugar once a day, Disp: 100 each, Rfl: 3 .  levothyroxine (SYNTHROID, LEVOTHROID) 25 MCG tablet, Take 1 tablet by mouth  daily before breakfast, Disp: 30 tablet, Rfl: 3 .  metFORMIN (GLUCOPHAGE) 1000 MG tablet, 1000mg  (1  tablet) with breakfast and 1000mg  (1 tablet) with dinner. (Patient taking differently: Take 1,000 mg by mouth 2 (two) times daily with a meal. 1000mg  (1 tablet) with breakfast and 1000mg  (1 tablet) with dinner.), Disp: 180 tablet, Rfl: 3 .  metoprolol tartrate (LOPRESSOR) 25 MG tablet, Take 1 tablet (25 mg total) by mouth 2 (two) times daily., Disp: 60 tablet, Rfl: 3 .  ondansetron (ZOFRAN ODT) 4 MG disintegrating tablet, Take 1 tablet (4 mg total) by mouth every 8 (eight) hours as needed for nausea or vomiting., Disp: 20 tablet, Rfl: 0 .  ONETOUCH DELICA LANCETS FINE MISC, 1 each by Does not apply route daily. E11.9, Disp: , Rfl:  .  simvastatin (ZOCOR) 20 MG tablet, Take 1 tablet (20 mg total) by mouth at bedtime., Disp: 30 tablet, Rfl: 3 .  vitamin C (ASCORBIC ACID) 500 MG tablet, Take 500 mg by mouth daily. , Disp: , Rfl:  .  VITAMIN E PO, Take 1 tablet by mouth daily., Disp: , Rfl:  .  warfarin (COUMADIN) 5 MG tablet, Take 0.5-1 tablets (2.5-5 mg total) by mouth daily., Disp: 90 tablet, Rfl: 1  EXAM:  Filed Vitals:   02/12/15 0906  BP: 120/80  Pulse: 66  Temp: 97.6 F (36.4 C)    Body mass index is 27.37 kg/(m^2).  GENERAL: vitals reviewed and listed above, alert, oriented, appears well hydrated and in no acute distress  HEENT: atraumatic, conjunttiva clear, no obvious abnormalities on inspection of external nose and ears  NECK: no obvious masses on inspection  LUNGS: clear to auscultation bilaterally, no wheezes, rales or rhonchi, good air movement  CV: HRRR, no peripheral edema  MS: moves all extremities without noticeable abnormality  PSYCH: pleasant and cooperative, no obvious depression or anxiety  ASSESSMENT AND PLAN:  Discussed the following assessment and plan:  Controlled type 2 diabetes mellitus with diabetic neuropathy, without long-term current use of insulin (HCC) - Plan: Hemoglobin A1c -foot exam done -labs today -lifestyle recs  Neuropathy  (HCC) -advised regular podiatry care, good foot care  Other pulmonary embolism without acute cor pulmonale, unspecified chronicity (HCC) -stable, on chronic anticoagulation  Essential hypertension - Plan: Basic metabolic panel -stable, labs, cont current treatment  Other specified hypothyroidism -TSH, cont current treatment, adjust as needed  Hyperlipemia - Plan: Lipid panel  Protein-calorie malnutrition (HCC) -diet and appetite and energy improving; cont healthy regular meals  -Patient advised to return or notify a doctor immediately if symptoms worsen or persist or new concerns arise.  Patient Instructions  BEFORE YOU LEAVE: -labs -schedule Medicare Wellness Visit in 3 months  -please call your podiatry office today to schedule appointment  -We have ordered labs or studies at this visit. It can take up to 1-2 weeks for results and processing. We will contact you with instructions IF your results are abnormal. Normal results  will be released to your [facility]. If you have not heard from [place] or can not find your results in [facility] in 2 weeks please contact our office.  We recommend the following healthy lifestyle measures: - eat a healthy whole foods diet consisting of regular small meals composed of vegetables, fruits, beans, nuts, seeds, healthy meats such as white chicken and fish and whole grains.  - avoid sweets, white starchy foods, fried foods, fast food, processed foods, sodas, red meet and other fattening foods.  - get a least 150-300 minutes of aerobic exercise per week.              Kriste Basque R.

## 2015-02-12 NOTE — Patient Instructions (Signed)
BEFORE YOU LEAVE: -labs -schedule Medicare Wellness Visit in 3 months  -please call your podiatry office today to schedule appointment  -We have ordered labs or studies at this visit. It can take up to 1-2 weeks for results and processing. We will contact you with instructions IF your results are abnormal. Normal results will be released to your [facility]. If you have not heard from [place] or can not find your results in [facility] in 2 weeks please contact our office.  We recommend the following healthy lifestyle measures: - eat a healthy whole foods diet consisting of regular small meals composed of vegetables, fruits, beans, nuts, seeds, healthy meats such as white chicken and fish and whole grains.  - avoid sweets, white starchy foods, fried foods, fast food, processed foods, sodas, red meet and other fattening foods.  - get a least 150-300 minutes of aerobic exercise per week.

## 2015-02-16 ENCOUNTER — Ambulatory Visit (INDEPENDENT_AMBULATORY_CARE_PROVIDER_SITE_OTHER): Payer: Medicare Other | Admitting: *Deleted

## 2015-02-16 DIAGNOSIS — I2699 Other pulmonary embolism without acute cor pulmonale: Secondary | ICD-10-CM

## 2015-02-16 DIAGNOSIS — Z7901 Long term (current) use of anticoagulants: Secondary | ICD-10-CM

## 2015-02-16 DIAGNOSIS — Z5181 Encounter for therapeutic drug level monitoring: Secondary | ICD-10-CM

## 2015-02-16 LAB — POCT INR: INR: 2

## 2015-03-02 ENCOUNTER — Ambulatory Visit (INDEPENDENT_AMBULATORY_CARE_PROVIDER_SITE_OTHER): Payer: Medicare Other | Admitting: *Deleted

## 2015-03-02 DIAGNOSIS — I2699 Other pulmonary embolism without acute cor pulmonale: Secondary | ICD-10-CM | POA: Diagnosis not present

## 2015-03-02 DIAGNOSIS — Z5181 Encounter for therapeutic drug level monitoring: Secondary | ICD-10-CM | POA: Diagnosis not present

## 2015-03-02 DIAGNOSIS — Z7901 Long term (current) use of anticoagulants: Secondary | ICD-10-CM | POA: Diagnosis not present

## 2015-03-02 LAB — POCT INR: INR: 2.4

## 2015-03-03 DIAGNOSIS — Z933 Colostomy status: Secondary | ICD-10-CM | POA: Diagnosis not present

## 2015-03-03 DIAGNOSIS — E119 Type 2 diabetes mellitus without complications: Secondary | ICD-10-CM | POA: Diagnosis not present

## 2015-03-03 DIAGNOSIS — Z4801 Encounter for change or removal of surgical wound dressing: Secondary | ICD-10-CM | POA: Diagnosis not present

## 2015-03-03 DIAGNOSIS — K5660 Unspecified intestinal obstruction: Secondary | ICD-10-CM | POA: Diagnosis not present

## 2015-03-05 DIAGNOSIS — Z933 Colostomy status: Secondary | ICD-10-CM | POA: Diagnosis not present

## 2015-03-05 DIAGNOSIS — K5660 Unspecified intestinal obstruction: Secondary | ICD-10-CM | POA: Diagnosis not present

## 2015-03-05 DIAGNOSIS — E119 Type 2 diabetes mellitus without complications: Secondary | ICD-10-CM | POA: Diagnosis not present

## 2015-03-05 DIAGNOSIS — Z4801 Encounter for change or removal of surgical wound dressing: Secondary | ICD-10-CM | POA: Diagnosis not present

## 2015-03-16 ENCOUNTER — Ambulatory Visit (INDEPENDENT_AMBULATORY_CARE_PROVIDER_SITE_OTHER): Payer: Medicare Other | Admitting: *Deleted

## 2015-03-16 DIAGNOSIS — I2699 Other pulmonary embolism without acute cor pulmonale: Secondary | ICD-10-CM | POA: Diagnosis not present

## 2015-03-16 DIAGNOSIS — Z7901 Long term (current) use of anticoagulants: Secondary | ICD-10-CM | POA: Diagnosis not present

## 2015-03-16 DIAGNOSIS — Z5181 Encounter for therapeutic drug level monitoring: Secondary | ICD-10-CM

## 2015-03-16 LAB — POCT INR: INR: 2.7

## 2015-03-24 ENCOUNTER — Ambulatory Visit (INDEPENDENT_AMBULATORY_CARE_PROVIDER_SITE_OTHER): Payer: Medicare Other | Admitting: Sports Medicine

## 2015-03-24 ENCOUNTER — Encounter: Payer: Self-pay | Admitting: Sports Medicine

## 2015-03-24 DIAGNOSIS — R0989 Other specified symptoms and signs involving the circulatory and respiratory systems: Secondary | ICD-10-CM

## 2015-03-24 DIAGNOSIS — M79676 Pain in unspecified toe(s): Secondary | ICD-10-CM

## 2015-03-24 DIAGNOSIS — B351 Tinea unguium: Secondary | ICD-10-CM | POA: Diagnosis not present

## 2015-03-24 DIAGNOSIS — E0840 Diabetes mellitus due to underlying condition with diabetic neuropathy, unspecified: Secondary | ICD-10-CM

## 2015-03-24 NOTE — Patient Instructions (Addendum)
Diabetes and Foot Care Diabetes may cause you to have problems because of poor blood supply (circulation) to your feet and legs. This may cause the skin on your feet to become thinner, break easier, and heal more slowly. Your skin may become dry, and the skin may peel and crack. You may also have nerve damage in your legs and feet causing decreased feeling in them. You may not notice minor injuries to your feet that could lead to infections or more serious problems. Taking care of your feet is one of the most important things you can do for yourself.  HOME CARE INSTRUCTIONS  Wear shoes at all times, even in the house. Do not go barefoot. Bare feet are easily injured.  Check your feet daily for blisters, cuts, and redness. If you cannot see the bottom of your feet, use a mirror or ask someone for help.  Wash your feet with warm water (do not use hot water) and mild soap. Then pat your feet and the areas between your toes until they are completely dry. Do not soak your feet as this can dry your skin.  Apply a moisturizing lotion or petroleum jelly (that does not contain alcohol and is unscented) to the skin on your feet and to dry, brittle toenails. Do not apply lotion between your toes.  Trim your toenails straight across. Do not dig under them or around the cuticle. File the edges of your nails with an emery board or nail file.  Do not cut corns or calluses or try to remove them with medicine.  Wear clean socks or stockings every day. Make sure they are not too tight. Do not wear knee-high stockings since they may decrease blood flow to your legs.  Wear shoes that fit properly and have enough cushioning. To break in new shoes, wear them for just a few hours a day. This prevents you from injuring your feet. Always look in your shoes before you put them on to be sure there are no objects inside.  Do not cross your legs. This may decrease the blood flow to your feet.  If you find a minor scrape,  cut, or break in the skin on your feet, keep it and the skin around it clean and dry. These areas may be cleansed with mild soap and water. Do not cleanse the area with peroxide, alcohol, or iodine.  When you remove an adhesive bandage, be sure not to damage the skin around it.  If you have a wound, look at it several times a day to make sure it is healing.  Do not use heating pads or hot water bottles. They may burn your skin. If you have lost feeling in your feet or legs, you may not know it is happening until it is too late.  Make sure your health care provider performs a complete foot exam at least annually or more often if you have foot problems. Report any cuts, sores, or bruises to your health care provider immediately. SEEK MEDICAL CARE IF:   You have an injury that is not healing.  You have cuts or breaks in the skin.  You have an ingrown nail.  You notice redness on your legs or feet.  You feel burning or tingling in your legs or feet.  You have pain or cramps in your legs and feet.  Your legs or feet are numb.  Your feet always feel cold. SEEK IMMEDIATE MEDICAL CARE IF:   There is increasing redness,   swelling, or pain in or around a wound.  There is a red line that goes up your leg.  Pus is coming from a wound.  You develop a fever or as directed by your health care provider.  You notice a bad smell coming from an ulcer or wound.   This information is not intended to replace advice given to you by your health care provider. Make sure you discuss any questions you have with your health care provider.   Document Released: 12/18/1999 Document Revised: 08/22/2012 Document Reviewed: 05/29/2012 Elsevier Interactive Patient Education 2016 [employer]   [employer]

## 2015-03-24 NOTE — Progress Notes (Signed)
Patient ID: Hannah [name], female   DOB: 01-28-1940, 75 y.o.   MRN: [id number] Subjective: Hannah [name] is a 75 y.o. female patient with history of diabetes who presents to office today complaining of long, painful nails  while ambulating in shoes; unable to trim. Patient states that the glucose reading this morning was 99 mg/dl. Patient denies any new changes in medication or new problems. Patient denies any new cramping, numbness, burning or tingling in the legs.  Admits that she had a lot of discomfort with last nail trim in the office and experienced a lot of bleeding after nail trim.  Patient Active Problem List   Diagnosis Date Noted  . Admission for therapeutic drug monitoring 02/16/2015  . Pulmonary embolism (HCC) 02/12/2015  . Neuropathy (HCC) 02/12/2015  . Osteoarthritis 08/12/2014  . Normocytic anemia 06/08/2014  . Hypothyroid 06/06/2013  . Hypertension 06/06/2013  . Diabetes mellitus with neurological manifestations, controlled (HCC) 06/06/2013  . Hyperlipemia 02/25/2013  . Long term current use of anticoagulant therapy 07/27/2012   Current Outpatient Prescriptions on File Prior to Visit  Medication Sig Dispense Refill  . acetaminophen (TYLENOL) 500 MG tablet Take 500 mg by mouth every 6 (six) hours as needed (pain).    . Cholecalciferol (VITAMIN D PO) Take 1 tablet by mouth daily.    Marland Kitchen glucose blood (ONETOUCH VERIO) test strip 1 each by Other route daily. Dx E11.9    . glucose blood (ONETOUCH VERIO) test strip Use as instructed to check blood sugar once a day 100 each 3  . levothyroxine (SYNTHROID, LEVOTHROID) 25 MCG tablet Take 1 tablet by mouth  daily before breakfast 30 tablet 3  . metFORMIN (GLUCOPHAGE) 1000 MG tablet  (1 tablet) with breakfast and  (1 tablet) with dinner. (Patient taking differently: Take 1,000 mg by mouth 2 (two) times daily with a meal.  (1 tablet) with breakfast and  (1 tablet) with dinner.) 180 tablet 3  . metoprolol tartrate  (LOPRESSOR) 25 MG tablet Take 1 tablet (25 mg total) by mouth 2 (two) times daily. 60 tablet 3  . ondansetron (ZOFRAN ODT) 4 MG disintegrating tablet Take 1 tablet (4 mg total) by mouth every 8 (eight) hours as needed for nausea or vomiting. 20 tablet 0  . ONETOUCH DELICA LANCETS FINE MISC 1 each by Does not apply route daily. E11.9    . simvastatin (ZOCOR) 20 MG tablet Take 1 tablet (20 mg total) by mouth at bedtime. 30 tablet 3  . vitamin C (ASCORBIC ACID) 500 MG tablet Take 500 mg by mouth daily.     Marland Kitchen VITAMIN E PO Take 1 tablet by mouth daily.    Marland Kitchen warfarin (COUMADIN) 5 MG tablet Take 0.5-1 tablets (2.5-5 mg total) by mouth daily. 90 tablet 1   No current facility-administered medications on file prior to visit.   Allergies  Allergen Reactions  . Augmentin [Amoxicillin-Pot Clavulanate] Other (See Comments)    Severe vaginal itching  . Tranxene [Clorazepate] Itching    Recent Results (from the past 2160 hour(s))  POCT INR     Status: None   Collection Time: 01/19/15  1:42 PM  Result Value Ref Range   INR 1.5   POCT INR     Status: None   Collection Time: 02/02/15  1:40 PM  Result Value Ref Range   INR 1.6   Hemoglobin A1c     Status: None   Collection Time: 02/12/15  9:35 AM  Result Value Ref Range   Hgb A1c MFr Bld  6.1 4.6 - 6.5 %    Comment: Glycemic Control Guidelines for People with Diabetes:Non Diabetic:  <6%Goal of Therapy: <7%Additional Action Suggested:  >8%   Basic metabolic panel     Status: Abnormal   Collection Time: 02/12/15  9:35 AM  Result Value Ref Range   Sodium 141 135 - 145 mEq/L   Potassium 3.7 3.5 - 5.1 mEq/L   Chloride 107 96 - 112 mEq/L   CO2 28 19 - 32 mEq/L   Glucose, Bld 115 (H) 70 - 99 mg/dL   BUN 16 6 - 23 mg/dL   Creatinine, Ser 0.99 0.40 - 1.20 mg/dL   Calcium 9.6 8.4 - 83.3 mg/dL   GFR 82.50 >53.97 mL/min  Lipid panel     Status: None   Collection Time: 02/12/15  9:35 AM  Result Value Ref Range   Cholesterol 154 0 - 200 mg/dL     Comment: ATP III Classification       Desirable:  < 200 mg/dL               Borderline High:  200 - 239 mg/dL          High:  > = 673 mg/dL   Triglycerides 41.9 0.0 - 149.0 mg/dL    Comment: Normal:  <379 mg/dLBorderline High:  150 - 199 mg/dL   HDL 02.40 >97.35 mg/dL   VLDL 32.9 0.0 - 92.4 mg/dL   LDL Cholesterol 76 0 - 99 mg/dL   Total CHOL/HDL Ratio 2     Comment:                Men          Women1/2 Average Risk     3.4          3.3Average Risk          5.0          4.42X Average Risk          9.6          7.13X Average Risk          15.0          11.0                       NonHDL 87.55     Comment: NOTE:  Non-HDL goal should be 30 mg/dL higher than patient's LDL goal (i.e. LDL goal of < 70 mg/dL, would have non-HDL goal of < 100 mg/dL)  TSH     Status: None   Collection Time: 02/12/15  9:35 AM  Result Value Ref Range   TSH 1.18 0.35 - 4.50 uIU/mL  POCT INR     Status: None   Collection Time: 02/16/15  1:51 PM  Result Value Ref Range   INR 2.0   POCT INR     Status: None   Collection Time: 03/02/15  1:09 PM  Result Value Ref Range   INR 2.4   POCT INR     Status: None   Collection Time: 03/16/15  2:39 PM  Result Value Ref Range   INR 2.7     Objective: General: Patient is awake, alert, and oriented x 3 and in no acute distress.  Integument: Skin is warm, dry and supple bilateral. Nails are tender, long, thickened and  dystrophic with subungual debris, consistent with onychomycosis, 1-5 bilateral. No signs of infection. No open lesions or preulcerative lesions present bilateral. Previous bunion and hammertoe correction with  old scars bilateral. Remaining integument unremarkable.  Vasculature:  Dorsalis Pedis pulse 1/4 bilateral. Posterior Tibial pulse  1/4 bilateral.  Capillary fill time <3 sec 1-5 bilateral. No hair growth to the level of the digits. Temperature gradient within normal limits. No varicosities present bilateral. No edema present bilateral.   Neurology: The  patient has intact sensation measured with a 5.07/10g Semmes Weinstein Monofilament at all pedal sites bilateral . Vibratory sensation diminished bilateral with tuning fork. No Babinski sign present bilateral.   Musculoskeletal: No gross pedal deformities noted bilateral. Muscular strength 5/5 in all lower extremity muscular groups bilateral without pain on range of motion . No tenderness with calf compression bilateral.  Assessment and Plan: Problem List Items Addressed This Visit      Endocrine   Diabetes mellitus with neurological manifestations, controlled (HCC)    Other Visit Diagnoses    Dermatophytosis of nail    -  Primary    Pain of toe, unspecified laterality        Decreased pedal pulses           -Examined patient. -Discussed and educated patient on diabetic foot care, especially with  regards to the vascular, neurological and musculoskeletal systems.  -Stressed the importance of good glycemic control and the detriment of not  controlling glucose levels in relation to the foot. -Mechanically debrided all nails 1-5 bilateral using sterile nail nipper and filed with dremel without incident  -Answered all patient questions -Patient to return  in 3 months for at risk foot care -Patient advised to call the office if any problems or questions arise in the  Meantime.  Asencion Islam, DPM

## 2015-04-06 ENCOUNTER — Ambulatory Visit (INDEPENDENT_AMBULATORY_CARE_PROVIDER_SITE_OTHER): Payer: Medicare Other | Admitting: *Deleted

## 2015-04-06 DIAGNOSIS — Z7901 Long term (current) use of anticoagulants: Secondary | ICD-10-CM | POA: Diagnosis not present

## 2015-04-06 DIAGNOSIS — I2699 Other pulmonary embolism without acute cor pulmonale: Secondary | ICD-10-CM

## 2015-04-06 DIAGNOSIS — Z5181 Encounter for therapeutic drug level monitoring: Secondary | ICD-10-CM

## 2015-04-06 LAB — POCT INR: INR: 2.6

## 2015-04-10 ENCOUNTER — Encounter: Payer: Self-pay | Admitting: Family Medicine

## 2015-04-10 ENCOUNTER — Ambulatory Visit (INDEPENDENT_AMBULATORY_CARE_PROVIDER_SITE_OTHER): Payer: Medicare Other | Admitting: Family Medicine

## 2015-04-10 VITALS — BP 144/82 | HR 69 | Temp 97.7°F | Ht 65.75 in | Wt 175.8 lb

## 2015-04-10 DIAGNOSIS — E785 Hyperlipidemia, unspecified: Secondary | ICD-10-CM

## 2015-04-10 DIAGNOSIS — M791 Myalgia, unspecified site: Secondary | ICD-10-CM

## 2015-04-10 DIAGNOSIS — E038 Other specified hypothyroidism: Secondary | ICD-10-CM | POA: Diagnosis not present

## 2015-04-10 DIAGNOSIS — E1140 Type 2 diabetes mellitus with diabetic neuropathy, unspecified: Secondary | ICD-10-CM

## 2015-04-10 DIAGNOSIS — Z0000 Encounter for general adult medical examination without abnormal findings: Secondary | ICD-10-CM | POA: Diagnosis not present

## 2015-04-10 DIAGNOSIS — I10 Essential (primary) hypertension: Secondary | ICD-10-CM | POA: Diagnosis not present

## 2015-04-10 LAB — MICROALBUMIN / CREATININE URINE RATIO
Creatinine,U: 91.6 mg/dL
MICROALB UR: 6.2 mg/dL — AB (ref 0.0–1.9)
Microalb Creat Ratio: 6.8 mg/g (ref 0.0–30.0)

## 2015-04-10 MED ORDER — METFORMIN HCL 1000 MG PO TABS: ORAL_TABLET | ORAL | Status: DC

## 2015-04-10 MED ORDER — METOPROLOL TARTRATE 25 MG PO TABS: 25.0000 mg | ORAL_TABLET | Freq: Two times a day (BID) | ORAL | Status: DC

## 2015-04-10 MED ORDER — LEVOTHYROXINE SODIUM 25 MCG PO TABS: ORAL_TABLET | ORAL | Status: DC

## 2015-04-10 MED ORDER — SIMVASTATIN 20 MG PO TABS: 20.0000 mg | ORAL_TABLET | Freq: Every day | ORAL | Status: DC

## 2015-04-10 MED ORDER — ONETOUCH DELICA LANCETS FINE MISC: 1.0000 | Freq: Every day | Status: DC

## 2015-04-10 MED ORDER — GLUCOSE BLOOD VI STRP: ORAL_STRIP | Status: DC

## 2015-04-10 NOTE — Progress Notes (Signed)
Pre visit review using our clinic review tool, if applicable. No additional management support is needed unless otherwise documented below in the visit note. 

## 2015-04-10 NOTE — Progress Notes (Signed)
Medicare Annual Preventive Care Visit  (initial annual wellness or annual wellness exam)  Concerns and/or follow up today:  Concern right side/back pain: -started 2 weeks ago, has been doing swimming and stretching exercises -Point tenderness in muscles in this area and feels this with certain movements of the arms -denies: Thoughts of breath, cough, malaise, weakness, fevers  Hypothyroidism:  -TSH normal 02/12/15 -on synthroid chronically   -denies: hot/cold intol, palpitations, skin or nail changes  DM w/ neuropathy:  -hgba1c 6.1 on 02/12/15 -current meds:metformin 1000mg  bid used to take glipizide - now not taking -denies: polyuria, polydipsia, hypoglycemia, foot ulcers, changes in vision   -see podiatrist -last eye exam: due, she agrees to schedule -lipids done recently and at goal  Hx PE and cardiogenic shock, HTN, HLD:  -followed by Dr. Tenny Craw in Cardiology  -on coumadin managed by cards coumadin clinic, statin, metoprolol  -denies: CP, SOB, swelling, palpitations, DOE, orthopnea   Deconditioning, Malnutrition/s/p bowel resection for bowel stricture the spring of 2016: -complicated by postoperative ileus and surgical wound dinfection w/ temporary need for TPN per notes, malnutrition -s/p PT/OT  -surgeon was Dr. Mignon Pine -she lives alone, but has lifesline and family members whom check on her daily and help with meals -reports appetite improved, doing much better an dstrength back  Knee and Hip OA s/p hip and knee replacements: -uses [employer] transportation -Saw Dr. Luiz Blare at [facility] orthopedics in the past -has long hx of severe pain in hips and knees when walks more then a few blocks or stands for long periods of time, has difficulty boarding a bus or walking ups hills -uses walker and cane to ambulate   ROS: negative for report of fevers, unintentional weight loss, vision changes, vision loss, hearing loss or change, chest pain, sob, hemoptysis, melena,  hematochezia, hematuria, genital discharge or lesions, falls, bleeding or bruising, loc, thoughts of suicide or self harm, memory loss  1.) Patient-completed health risk assessment  - completed and reviewed, see scanned documentation  2.) Review of Medical History: -PMH, PSH, Family History and current specialty and care providers reviewed and updated and listed below  - see scanned in document in chart and below  Past Medical History  Diagnosis Date  . Thyroid disease   . Acute massive pulmonary embolism (HCC) 07/13/2012    Massive PE w/ PEA arrest 07/13/12 >TNK >IVC filter >discharged on comadin    . Diabetes mellitus, type 2 (HCC) 07/15/2012    with peripheral neuropathy  . Hypertension 07/14/2012  . Hyperlipemia 07/14/2012  . Large bowel stricture (HCC)     s/p colectomy in 2016  . Osteoarthritis     s/p hip and knee replacements    Past Surgical History  Procedure Laterality Date  . Sp arthro hip*l*    . Knee arthroscopy    . Insertion of vena cava filter N/A 07/16/2012    Procedure: INSERTION OF VENA CAVA FILTER;  Surgeon: [name], MD;  Location: [facility] CATH LAB;  Service: Cardiovascular;  Laterality: N/A;  . Flexible sigmoidoscopy N/A 05/30/2014    Procedure: FLEXIBLE SIGMOIDOSCOPY;  Surgeon: Jeani Hawking, MD;  Location: [facility] ENDOSCOPY;  Service: Endoscopy;  Laterality: N/A;  . Partial colectomy N/A 06/03/2014    Procedure: PARTIAL COLECTOMY;  Surgeon: Harriette Bouillon, MD;  Location: [facility] OR;  Service: General;  Laterality: N/A;  . Colostomy N/A 06/03/2014    Procedure: COLOSTOMY;  Surgeon: Harriette Bouillon, MD;  Location: [facility] OR;  Service: General;  Laterality: N/A;    Social History  Social History  . Marital Status: Widowed    Spouse Name: N/A  . Number of Children: N/A  . Years of Education: N/A   Occupational History  . Not on file.   Social History Main Topics  . Smoking status: Former Smoker -- 1.00 packs/day for 10 years    Types: Cigarettes    Quit date:  01/04/1968  . Smokeless tobacco: Never Used  . Alcohol Use: No  . Drug Use: No  . Sexual Activity: Not on file   Other Topics Concern  . Not on file   Social History Narrative   Lives alone.           No family history on file.  Current Outpatient Prescriptions on File Prior to Visit  Medication Sig Dispense Refill  . acetaminophen (TYLENOL) 500 MG tablet Take 500 mg by mouth every 6 (six) hours as needed (pain).    . Cholecalciferol (VITAMIN D PO) Take 1 tablet by mouth daily.    Marland Kitchen glucose blood (ONETOUCH VERIO) test strip 1 each by Other route daily. Dx E11.9    . glucose blood (ONETOUCH VERIO) test strip Use as instructed to check blood sugar once a day 100 each 3  . ONETOUCH DELICA LANCETS FINE MISC 1 each by Does not apply route daily. E11.9    . vitamin C (ASCORBIC ACID) 500 MG tablet Take 500 mg by mouth daily.     Marland Kitchen VITAMIN E PO Take 1 tablet by mouth daily.    Marland Kitchen warfarin (COUMADIN) 5 MG tablet Take 0.5-1 tablets (2.5-5 mg total) by mouth daily. 90 tablet 1   No current facility-administered medications on file prior to visit.     3.) Review of functional ability and level of safety:  Any difficulty hearing?  NO  History of falling?  NO  Any trouble with IADLs - using a phone, using transportation, grocery shopping, preparing meals, doing housework, doing laundry, taking medications and managing money? NO  Advance Directives? NO, declined recently  See summary of recommendations in Patient Instructions below.  4.) Physical Exam Filed Vitals:   04/10/15 0829 04/10/15 0900  BP: 144/82 144/82  Pulse: 69   Temp: 97.7 F (36.5 C)    Estimated body mass index is 28.59 kg/(m^2) as calculated from the following:   Height as of this encounter: 5' 5.75" (1.67 m).   Weight as of this encounter: 175 lb 12.8 oz (79.742 kg).  EKG (optional): deferred  General: alert, appear well hydrated and in no acute distress  HEENT: visual acuity grossly intact, normal  inspection ear canal, oral and nasal mucosa  NECK: no masses  CV: HRRR, no peripheral edema  Lungs: CTA bilaterally  MS: point TTP in the LD muscle on the R, no bony TTP, normal inspection of skin and surrounding area  Psych: pleasant and cooperative, no obvious depression or anxiety  Cognitive function grossly intact  See patient instructions for recommendations.  Education and counseling regarding the above review of health provided with a plan for the following: -see scanned patient completed form for further details -fall prevention strategies discussed  -healthy lifestyle discussed -importance and resources for completing advanced directives discussed -see patient instructions below for any other recommendations provided  4)The following written screening schedule of preventive measures were reviewed with assessment and plan made per below, orders and patient instructions:      AAA screening done if applicable     Alcohol screening done     Obesity Screening and  counseling done     STI screening (Hep C if born [birth year]) offered and per pt wishes     Tobacco Screening done        Pneumococcal (PPSV23 -one dose after 64, one before if risk factors), influenza yearly and hepatitis B vaccines (if high risk - end stage renal disease, IV drugs, homosexual men, live in home for mentally retarded, hemophilia receiving factors) ASSESSMENT/PLAN: done if applicable      Screening mammograph (yearly if >40) ASSESSMENT/PLAN: utd, 10/2014      Screening Pap smear/pelvic exam (q2 years) ASSESSMENT/PLAN: n/a, declined      Colorectal cancer screening (FOBT yearly or flex sig q4y or colonoscopy q10y or barium enema q4y) ASSESSMENT/PLAN: utd       Diabetes outpatient self-management training services ASSESSMENT/PLAN: utd or done      Bone mass measurements(covered q2y if indicated - estrogen def, osteoporosis, hyperparathyroid, vertebral abnormalities, osteoporosis or  steroids) ASSESSMENT/PLAN: utd, done 04/2014      Screening for glaucoma(q1y if high risk - diabetes, FH, AA and > 50 or hispanic and > 65) ASSESSMENT/PLAN: utd or advised - advised to schedule optho diabetic exam and number provided to call to schedule      Medical nutritional therapy for individuals with diabetes or renal disease ASSESSMENT/PLAN: see orders      Cardiovascular screening blood tests (lipids q5y) ASSESSMENT/PLAN: see orders and labs      Diabetes screening tests ASSESSMENT/PLAN: see orders and labs   7.) Summary:   Medicare annual wellness visit, subsequent -risk factors and conditions per above assessment were discussed and treatment, recommendations and referrals were offered per documentation above and orders and patient instructions.  Controlled type 2 diabetes mellitus with diabetic neuropathy, without long-term current use of insulin (HCC) - Plan: Microalbumin/Creatinine Ratio, Urine -lifestyle recs -continue current medications -advised eye exam and info provided to schedule  Essential hypertension -recheck after sitting, follow up in 2 weeks to recheck if remains borderline  Other specified hypothyroidism -stable, continue current tx  Hyperlipemia - Plan: simvastatin (ZOCOR) 20 MG tablet -stable, labs reviewed, cont treatment  Muscle pain -likely, discussed conservative management and advised follow up if worsens or persists in several weeks  Patient Instructions  Before you leave: -Recheck blood pressure, schedule blood pressure check in 2 weeks if remains elevated -shingles vaccine information -Schedule follow up in 3-4 months -Labs for urine test  Schedule your diabetic eye exam. Please call one of the numbers provided for our doctors.  Vit D3 [phone] IU daily  Schedule mammogram in October 2017  For the side pain: -Heat for 15 minutes twice daily -Topical sports creams with capsaicni or menthol can help the pain -Tylenol per  instructions as needed for pain -Gentle stretching and activities are okay -Please follow up if pain persists in 3-4 weeks or sooner if pain worsens  We recommend the following healthy lifestyle measures: - eat a healthy whole foods diet consisting of regular small meals composed of vegetables, fruits, beans, nuts, seeds, healthy meats such as white chicken and fish and whole grains.  - avoid sweets, white starchy foods, fried foods, fast food, processed foods, sodas, red meet and other fattening foods.  - get a least 150-300 minutes of aerobic exercise per week.

## 2015-04-10 NOTE — Patient Instructions (Signed)
Before you leave: -Recheck blood pressure, schedule blood pressure check in 2 weeks if remains elevated -shingles vaccine information -Schedule follow up in 3-4 months -Labs for urine test  Schedule your diabetic eye exam. Please call one of the numbers provided for our doctors.  Vit D3 [phone] IU daily  Schedule mammogram in October 2017  For the side pain: -Heat for 15 minutes twice daily -Topical sports creams with capsaicni or menthol can help the pain -Tylenol per instructions as needed for pain -Gentle stretching and activities are okay -Please follow up if pain persists in 3-4 weeks or sooner if pain worsens  We recommend the following healthy lifestyle measures: - eat a healthy whole foods diet consisting of regular small meals composed of vegetables, fruits, beans, nuts, seeds, healthy meats such as white chicken and fish and whole grains.  - avoid sweets, white starchy foods, fried foods, fast food, processed foods, sodas, red meet and other fattening foods.  - get a least 150-300 minutes of aerobic exercise per week.

## 2015-04-10 NOTE — Addendum Note (Signed)
Addended by: Johnella Moloney on: 04/10/2015 09:07 AM   Modules accepted: Orders

## 2015-04-24 ENCOUNTER — Ambulatory Visit (INDEPENDENT_AMBULATORY_CARE_PROVIDER_SITE_OTHER): Payer: Medicare Other | Admitting: Family Medicine

## 2015-04-24 ENCOUNTER — Encounter: Payer: Self-pay | Admitting: Family Medicine

## 2015-04-24 VITALS — BP 126/84 | HR 65 | Temp 98.0°F | Ht 65.75 in | Wt 175.8 lb

## 2015-04-24 DIAGNOSIS — I10 Essential (primary) hypertension: Secondary | ICD-10-CM | POA: Diagnosis not present

## 2015-04-24 NOTE — Progress Notes (Signed)
Pre visit review using our clinic review tool, if applicable. No additional management support is needed unless otherwise documented below in the visit note. 

## 2015-04-24 NOTE — Progress Notes (Signed)
HPI:  Follow up HTN: -Was up at her annual wellness visit a little, however she was stressed at that appointment due to several recent death in the family -Reports she feels great and is without any complaints  -meds: metoprolol from cardiologist -Denies chest pain, shortness of breath, dyspnea on exertion, vision changes   ROS: See pertinent positives and negatives per HPI.  Past Medical History  Diagnosis Date  . Thyroid disease   . Acute massive pulmonary embolism (HCC) 07/13/2012    Massive PE w/ PEA arrest 07/13/12 >TNK >IVC filter >discharged on comadin    . Diabetes mellitus, type 2 (HCC) 07/15/2012    with peripheral neuropathy  . Hypertension 07/14/2012  . Hyperlipemia 07/14/2012  . Large bowel stricture (HCC)     s/p colectomy in 2016  . Osteoarthritis     s/p hip and knee replacements    Past Surgical History  Procedure Laterality Date  . Sp arthro hip*l*    . Knee arthroscopy    . Insertion of vena cava filter N/A 07/16/2012    Procedure: INSERTION OF VENA CAVA FILTER;  Surgeon: [name], MD;  Location: [facility] CATH LAB;  Service: Cardiovascular;  Laterality: N/A;  . Flexible sigmoidoscopy N/A 05/30/2014    Procedure: FLEXIBLE SIGMOIDOSCOPY;  Surgeon: Jeani Hawking, MD;  Location: [facility] ENDOSCOPY;  Service: Endoscopy;  Laterality: N/A;  . Partial colectomy N/A 06/03/2014    Procedure: PARTIAL COLECTOMY;  Surgeon: Harriette Bouillon, MD;  Location: [facility] OR;  Service: General;  Laterality: N/A;  . Colostomy N/A 06/03/2014    Procedure: COLOSTOMY;  Surgeon: Harriette Bouillon, MD;  Location: [facility] OR;  Service: General;  Laterality: N/A;    No family history on file.  Social History   Social History  . Marital Status: Widowed    Spouse Name: N/A  . Number of Children: N/A  . Years of Education: N/A   Social History Main Topics  . Smoking status: Former Smoker -- 1.00 packs/day for 10 years    Types: Cigarettes    Quit date: 01/04/1968  . Smokeless tobacco: Never Used  .  Alcohol Use: No  . Drug Use: No  . Sexual Activity: Not Asked   Other Topics Concern  . None   Social History Narrative   Lives alone.            Current outpatient prescriptions:  .  acetaminophen (TYLENOL) 500 MG tablet, Take 500 mg by mouth every 6 (six) hours as needed (pain)., Disp: , Rfl:  .  Cholecalciferol (VITAMIN D PO), Take 1 tablet by mouth daily., Disp: , Rfl:  .  glucose blood (ONETOUCH VERIO) test strip, 1 each by Other route daily. Dx E11.9, Disp: , Rfl:  .  glucose blood (ONETOUCH VERIO) test strip, Use as instructed to check blood sugar once a day, Disp: 100 each, Rfl: 3 .  levothyroxine (SYNTHROID, LEVOTHROID) 25 MCG tablet, Take 1 tablet by mouth  daily before breakfast, Disp: 90 tablet, Rfl: 3 .  metFORMIN (GLUCOPHAGE) 1000 MG tablet,  (1 tablet) with breakfast and  (1 tablet) with dinner., Disp: 180 tablet, Rfl: 3 .  metoprolol tartrate (LOPRESSOR) 25 MG tablet, Take 1 tablet (25 mg total) by mouth 2 (two) times daily., Disp: 180 tablet, Rfl: 3 .  ONETOUCH DELICA LANCETS FINE MISC, 1 each by Does not apply route daily. E11.9, Disp: 100 each, Rfl: 3 .  simvastatin (ZOCOR) 20 MG tablet, Take 1 tablet (20 mg total) by mouth at bedtime., Disp: 90  tablet, Rfl: 3 .  vitamin C (ASCORBIC ACID) 500 MG tablet, Take 500 mg by mouth daily. , Disp: , Rfl:  .  VITAMIN E PO, Take 1 tablet by mouth daily., Disp: , Rfl:  .  warfarin (COUMADIN) 5 MG tablet, Take 0.5-1 tablets (2.5-5 mg total) by mouth daily., Disp: 90 tablet, Rfl: 1  EXAM:  Filed Vitals:   04/24/15 0954  BP: 126/84  Pulse: 65  Temp: 98 F (36.7 C)    Body mass index is 28.59 kg/(m^2).  GENERAL: vitals reviewed and listed above, alert, oriented, appears well hydrated and in no acute distress  HEENT: atraumatic, conjunttiva clear, no obvious abnormalities on inspection of external nose and ears  NECK: no obvious masses on inspection  LUNGS: clear to auscultation bilaterally, no wheezes,  rales or rhonchi, good air movement  CV: HRRR, no peripheral edema  MS: moves all extremities without noticeable abnormality  PSYCH: pleasant and cooperative, no obvious depression or anxiety  ASSESSMENT AND PLAN:  Discussed the following assessment and plan:  Essential hypertension  -Blood pressure is much better today  -Advised that she scheduled for diabetic eye exam  -We'll plan to do microalbumin creatinine urine testing at her follow-up visit in June  -Continue current treatment and discussed lifestyle changes - a healthy diet and regular aerobic exercise were encouraged  -Patient advised to return or notify a doctor immediately if symptoms worsen or persist or new concerns arise.  There are no Patient Instructions on file for this visit.   Hannah Basque R.

## 2015-05-01 ENCOUNTER — Other Ambulatory Visit: Payer: Self-pay | Admitting: *Deleted

## 2015-05-01 ENCOUNTER — Telehealth: Payer: Self-pay | Admitting: *Deleted

## 2015-05-01 MED ORDER — WARFARIN SODIUM 5 MG PO TABS: 2.5000 mg | ORAL_TABLET | Freq: Every day | ORAL | Status: DC

## 2015-05-01 NOTE — Telephone Encounter (Signed)
Patient called for refill on warfarin

## 2015-05-04 ENCOUNTER — Ambulatory Visit (INDEPENDENT_AMBULATORY_CARE_PROVIDER_SITE_OTHER): Payer: Medicare Other | Admitting: *Deleted

## 2015-05-04 DIAGNOSIS — I2699 Other pulmonary embolism without acute cor pulmonale: Secondary | ICD-10-CM

## 2015-05-04 DIAGNOSIS — Z7901 Long term (current) use of anticoagulants: Secondary | ICD-10-CM

## 2015-05-04 DIAGNOSIS — Z5181 Encounter for therapeutic drug level monitoring: Secondary | ICD-10-CM

## 2015-05-04 LAB — POCT INR: INR: 2.3

## 2015-05-18 ENCOUNTER — Ambulatory Visit (INDEPENDENT_AMBULATORY_CARE_PROVIDER_SITE_OTHER): Payer: Medicare Other

## 2015-05-18 DIAGNOSIS — Z7901 Long term (current) use of anticoagulants: Secondary | ICD-10-CM

## 2015-05-18 DIAGNOSIS — I2699 Other pulmonary embolism without acute cor pulmonale: Secondary | ICD-10-CM

## 2015-05-18 DIAGNOSIS — Z5181 Encounter for therapeutic drug level monitoring: Secondary | ICD-10-CM

## 2015-05-18 LAB — POCT INR: INR: 2.8

## 2015-06-04 DIAGNOSIS — Z4801 Encounter for change or removal of surgical wound dressing: Secondary | ICD-10-CM | POA: Diagnosis not present

## 2015-06-04 DIAGNOSIS — K5660 Unspecified intestinal obstruction: Secondary | ICD-10-CM | POA: Diagnosis not present

## 2015-06-04 DIAGNOSIS — E119 Type 2 diabetes mellitus without complications: Secondary | ICD-10-CM | POA: Diagnosis not present

## 2015-06-04 DIAGNOSIS — Z933 Colostomy status: Secondary | ICD-10-CM | POA: Diagnosis not present

## 2015-06-05 DIAGNOSIS — K5660 Unspecified intestinal obstruction: Secondary | ICD-10-CM | POA: Diagnosis not present

## 2015-06-05 DIAGNOSIS — Z933 Colostomy status: Secondary | ICD-10-CM | POA: Diagnosis not present

## 2015-06-05 DIAGNOSIS — Z4801 Encounter for change or removal of surgical wound dressing: Secondary | ICD-10-CM | POA: Diagnosis not present

## 2015-06-05 DIAGNOSIS — E119 Type 2 diabetes mellitus without complications: Secondary | ICD-10-CM | POA: Diagnosis not present

## 2015-06-16 ENCOUNTER — Ambulatory Visit (INDEPENDENT_AMBULATORY_CARE_PROVIDER_SITE_OTHER): Payer: Medicare Other | Admitting: *Deleted

## 2015-06-16 DIAGNOSIS — I2699 Other pulmonary embolism without acute cor pulmonale: Secondary | ICD-10-CM | POA: Diagnosis not present

## 2015-06-16 DIAGNOSIS — Z7901 Long term (current) use of anticoagulants: Secondary | ICD-10-CM | POA: Diagnosis not present

## 2015-06-16 DIAGNOSIS — Z5181 Encounter for therapeutic drug level monitoring: Secondary | ICD-10-CM

## 2015-06-16 LAB — POCT INR: INR: 2.4

## 2015-06-23 ENCOUNTER — Ambulatory Visit: Payer: Medicare Other | Admitting: Sports Medicine

## 2015-06-23 DIAGNOSIS — Z0100 Encounter for examination of eyes and vision without abnormal findings: Secondary | ICD-10-CM | POA: Diagnosis not present

## 2015-06-23 DIAGNOSIS — H2513 Age-related nuclear cataract, bilateral: Secondary | ICD-10-CM | POA: Diagnosis not present

## 2015-06-23 DIAGNOSIS — H25013 Cortical age-related cataract, bilateral: Secondary | ICD-10-CM | POA: Diagnosis not present

## 2015-06-23 LAB — HM DIABETES EYE EXAM

## 2015-06-30 ENCOUNTER — Ambulatory Visit (INDEPENDENT_AMBULATORY_CARE_PROVIDER_SITE_OTHER): Payer: Medicare Other

## 2015-06-30 DIAGNOSIS — I2699 Other pulmonary embolism without acute cor pulmonale: Secondary | ICD-10-CM

## 2015-06-30 DIAGNOSIS — Z7901 Long term (current) use of anticoagulants: Secondary | ICD-10-CM | POA: Diagnosis not present

## 2015-06-30 DIAGNOSIS — Z5181 Encounter for therapeutic drug level monitoring: Secondary | ICD-10-CM | POA: Diagnosis not present

## 2015-06-30 LAB — POCT INR: INR: 2.4

## 2015-07-02 ENCOUNTER — Encounter: Payer: Self-pay | Admitting: Family Medicine

## 2015-07-21 ENCOUNTER — Ambulatory Visit (INDEPENDENT_AMBULATORY_CARE_PROVIDER_SITE_OTHER): Payer: Medicare Other | Admitting: *Deleted

## 2015-07-21 DIAGNOSIS — I2699 Other pulmonary embolism without acute cor pulmonale: Secondary | ICD-10-CM | POA: Diagnosis not present

## 2015-07-21 DIAGNOSIS — Z5181 Encounter for therapeutic drug level monitoring: Secondary | ICD-10-CM | POA: Diagnosis not present

## 2015-07-21 DIAGNOSIS — Z7901 Long term (current) use of anticoagulants: Secondary | ICD-10-CM

## 2015-07-21 LAB — POCT INR: INR: 3

## 2015-07-27 ENCOUNTER — Encounter: Payer: Self-pay | Admitting: Sports Medicine

## 2015-07-27 ENCOUNTER — Ambulatory Visit (INDEPENDENT_AMBULATORY_CARE_PROVIDER_SITE_OTHER): Payer: Medicare Other | Admitting: Sports Medicine

## 2015-07-27 DIAGNOSIS — B351 Tinea unguium: Secondary | ICD-10-CM | POA: Diagnosis not present

## 2015-07-27 DIAGNOSIS — R0989 Other specified symptoms and signs involving the circulatory and respiratory systems: Secondary | ICD-10-CM

## 2015-07-27 DIAGNOSIS — M79676 Pain in unspecified toe(s): Secondary | ICD-10-CM | POA: Diagnosis not present

## 2015-07-27 DIAGNOSIS — E0840 Diabetes mellitus due to underlying condition with diabetic neuropathy, unspecified: Secondary | ICD-10-CM | POA: Diagnosis not present

## 2015-07-27 NOTE — Progress Notes (Signed)
Patient ID: Jimi Teets, female   DOB: 05/02/1940, 75 y.o.   MRN: [id number]   Subjective: Hannah [name] is a 75 y.o. female patient with history of diabetes who presents to office today complaining of long, painful nails  while ambulating in shoes; unable to trim. Patient states that the glucose reading this morning was 114 mg/dl. Patient denies any new changes in medication or new problems. Patient denies any new cramping, numbness, burning or tingling in the legs.  Patient Active Problem List   Diagnosis Date Noted  . Encounter for therapeutic drug monitoring 05/04/2015  . Pulmonary embolism (HCC) 02/12/2015  . Neuropathy (HCC) 02/12/2015  . Osteoarthritis 08/12/2014  . Normocytic anemia 06/08/2014  . Hypothyroid 06/06/2013  . Hypertension 06/06/2013  . Diabetes mellitus with neurological manifestations, controlled (HCC) 06/06/2013  . Hyperlipemia 02/25/2013  . Long term current use of anticoagulant therapy 07/27/2012   Current Outpatient Prescriptions on File Prior to Visit  Medication Sig Dispense Refill  . acetaminophen (TYLENOL) 500 MG tablet Take 500 mg by mouth every 6 (six) hours as needed (pain).    . Cholecalciferol (VITAMIN D PO) Take 1 tablet by mouth daily.    Marland Kitchen glucose blood (ONETOUCH VERIO) test strip 1 each by Other route daily. Dx E11.9    . glucose blood (ONETOUCH VERIO) test strip Use as instructed to check blood sugar once a day 100 each 3  . levothyroxine (SYNTHROID, LEVOTHROID) 25 MCG tablet Take 1 tablet by mouth  daily before breakfast 90 tablet 3  . metFORMIN (GLUCOPHAGE) 1000 MG tablet 1000mg  (1 tablet) with breakfast and 1000mg  (1 tablet) with dinner. 180 tablet 3  . metoprolol tartrate (LOPRESSOR) 25 MG tablet Take 1 tablet (25 mg total) by mouth 2 (two) times daily. 180 tablet 3  . ONETOUCH DELICA LANCETS FINE MISC 1 each by Does not apply route daily. E11.9 100 each 3  . simvastatin (ZOCOR) 20 MG tablet Take 1 tablet (20 mg total) by mouth at bedtime. 90  tablet 3  . vitamin C (ASCORBIC ACID) 500 MG tablet Take 500 mg by mouth daily.     Marland Kitchen VITAMIN E PO Take 1 tablet by mouth daily.    Marland Kitchen warfarin (COUMADIN) 5 MG tablet Take 0.5-1 tablets (2.5-5 mg total) by mouth daily. 90 tablet 1   No current facility-administered medications on file prior to visit.    Allergies  Allergen Reactions  . Augmentin [Amoxicillin-Pot Clavulanate] Other (See Comments)    Severe vaginal itching  . Tranxene [Clorazepate] Itching    Recent Results (from the past 2160 hour(s))  POCT INR     Status: None   Collection Time: 05/04/15  2:39 PM  Result Value Ref Range   INR 2.3   POCT INR     Status: None   Collection Time: 05/18/15  2:22 PM  Result Value Ref Range   INR 2.8   POCT INR     Status: None   Collection Time: 06/16/15  2:39 PM  Result Value Ref Range   INR 2.4   HM DIABETES EYE EXAM     Status: None   Collection Time: 06/23/15 12:00 AM  Result Value Ref Range   HM Diabetic Eye Exam No Retinopathy No Retinopathy    Comment: [facility]  POCT INR     Status: None   Collection Time: 06/30/15  1:55 PM  Result Value Ref Range   INR 2.4   POCT INR     Status: None   Collection  Time: 07/21/15  1:56 PM  Result Value Ref Range   INR 3.0     Objective: General: Patient is awake, alert, and oriented x 3 and in no acute distress.  Integument: Skin is warm, dry and supple bilateral. Nails are tender, long, thickened and dystrophic with subungual debris, consistent with onychomycosis, 1-5 bilateral. No signs of infection. No open lesions or preulcerative lesions present bilateral. Previous bunion and hammertoe correction with old scars bilateral. Remaining integument unremarkable.  Vasculature:  Dorsalis Pedis pulse 1/4 bilateral. Posterior Tibial pulse  1/4 bilateral. Capillary fill time <3 sec 1-5 bilateral. No hair growth to the level of the digits. Temperature gradient within normal limits. No varicosities present bilateral. No edema present  bilateral.   Neurology: The patient has intact sensation measured with a 5.07/10g Semmes Weinstein Monofilament at all pedal sites bilateral . Vibratory sensation diminished bilateral with tuning fork. No Babinski sign present bilateral.   Musculoskeletal: No gross pedal deformities noted bilateral. Muscular strength 5/5 in all lower extremity muscular groups bilateral without pain on range of motion . No tenderness with calf compression bilateral.  Assessment and Plan: Problem List Items Addressed This Visit    None    Visit Diagnoses    Dermatophytosis of nail    -  Primary   Pain of toe, unspecified laterality       Decreased pedal pulses       Diabetes mellitus due to underlying condition, controlled, with diabetic neuropathy, unspecified long term insulin use status (HCC)         -Examined patient. -Discussed and educated patient on diabetic foot care, especially with  regards to the vascular, neurological and musculoskeletal systems.  -Stressed the importance of good glycemic control and the detriment of not  controlling glucose levels in relation to the foot. -Mechanically debrided all nails 1-5 bilateral using sterile nail nipper and filed with dremel without incident  -Answered all patient questions -Patient to return  in 3 months for at risk foot care -Patient advised to call the office if any problems or questions arise in the  Meantime.  Asencion Islam, DPM

## 2015-07-28 DIAGNOSIS — Z4801 Encounter for change or removal of surgical wound dressing: Secondary | ICD-10-CM | POA: Diagnosis not present

## 2015-07-28 DIAGNOSIS — E119 Type 2 diabetes mellitus without complications: Secondary | ICD-10-CM | POA: Diagnosis not present

## 2015-07-28 DIAGNOSIS — Z933 Colostomy status: Secondary | ICD-10-CM | POA: Diagnosis not present

## 2015-07-28 DIAGNOSIS — K5660 Unspecified intestinal obstruction: Secondary | ICD-10-CM | POA: Diagnosis not present

## 2015-08-10 NOTE — Progress Notes (Signed)
HPI:  Follow up:  Dental pain: -L lower molar x 1 day -needs extraction -current dentist was rude to her and told her had to wait for 1 month and she is not happy today -she wants to pay cash to have extrations  Hypothyroidism:  -TSH normal 02/12/15 -on synthroid chronically   -denies: hot/cold intol, palpitations, skin or nail changes  DM w/ neuropathy:  -hgba1c 6.1 on 02/12/15 -current meds:metformin 1000mg  bid; used to take glipizide -denies: polyuria, polydipsia, hypoglycemia, foot ulcers, changes in vision  -see podiatrist -lipids done 02/2015 and at goal  Hx PE and cardiogenic shock, HTN, HLD:  -followed by Dr. Tenny Craw in Cardiology  -on coumadin managed by cards coumadin clinic, statin, metoprolol  -denies: CP, SOB, swelling, palpitations, DOE, orthopnea   She lives alone, but has lifeline and family members whom check on her daily and help with meals  Knee and Hip OA s/p hip and knee replacements: -uses [employer] transportation -Saw Dr. Luiz Blare at [facility] orthopedics in the past -has long hx of severe pain in hips and knees when walks more then a few blocks or stands for long periods of time, has difficulty boarding a bus or walking ups hills -uses walker and cane to ambulate  ROS: See pertinent positives and negatives per HPI.  Past Medical History:  Diagnosis Date  . Acute massive pulmonary embolism (HCC) 07/13/2012   Massive PE w/ PEA arrest 07/13/12 >TNK >IVC filter >discharged on comadin    . Diabetes mellitus, type 2 (HCC) 07/15/2012   with peripheral neuropathy  . Hyperlipemia 07/14/2012  . Hypertension 07/14/2012  . Large bowel stricture (HCC)    s/p colectomy in 2016  . Osteoarthritis    s/p hip and knee replacements  . Thyroid disease     Past Surgical History:  Procedure Laterality Date  . COLOSTOMY N/A 06/03/2014   Procedure: COLOSTOMY;  Surgeon: Harriette Bouillon, MD;  Location: [facility] OR;  Service: General;  Laterality: N/A;  . FLEXIBLE  SIGMOIDOSCOPY N/A 05/30/2014   Procedure: Arnell Sieving;  Surgeon: Jeani Hawking, MD;  Location[facility] ENDOSCOPY;  Service: Endoscopy;  Laterality: N/A;  . INSERTION OF VENA CAVA FILTER N/A 07/16/2012   Procedure: INSERTION OF VENA CAVA FILTER;  Surgeon: [name], MD;  Location: [facility] CATH LAB;  Service: Cardiovascular;  Laterality: N/A;  . KNEE ARTHROSCOPY    . PARTIAL COLECTOMY N/A 06/03/2014   Procedure: PARTIAL COLECTOMY;  Surgeon: Harriette Bouillon, MD;  Location: [facility] OR;  Service: General;  Laterality: N/A;  . SP ARTHRO HIP*L*      No family history on file.  Social History   Social History  . Marital status: Widowed    Spouse name: N/A  . Number of children: N/A  . Years of education: N/A   Social History Main Topics  . Smoking status: Former Smoker    Packs/day: 1.00    Years: 10.00    Types: Cigarettes    Quit date: 01/04/1968  . Smokeless tobacco: Never Used  . Alcohol use No  . Drug use: No  . Sexual activity: Not Asked   Other Topics Concern  . None   Social History Narrative   Lives alone.            Current Outpatient Prescriptions:  .  acetaminophen (TYLENOL) 500 MG tablet, Take 500 mg by mouth every 6 (six) hours as needed (pain)., Disp: , Rfl:  .  Cholecalciferol (VITAMIN D PO), Take 1 tablet by mouth daily., Disp: , Rfl:  .  glucose blood (ONETOUCH VERIO) test strip, 1 each by Other route daily. Dx E11.9, Disp: , Rfl:  .  glucose blood (ONETOUCH VERIO) test strip, Use as instructed to check blood sugar once a day, Disp: 100 each, Rfl: 3 .  levothyroxine (SYNTHROID, LEVOTHROID) 25 MCG tablet, Take 1 tablet by mouth  daily before breakfast, Disp: 90 tablet, Rfl: 3 .  metFORMIN (GLUCOPHAGE) 1000 MG tablet, 1000mg  (1 tablet) with breakfast and 1000mg  (1 tablet) with dinner., Disp: 180 tablet, Rfl: 3 .  metoprolol tartrate (LOPRESSOR) 25 MG tablet, Take 1 tablet (25 mg total) by mouth 2 (two) times daily., Disp: 180 tablet, Rfl: 3 .  ONETOUCH DELICA  LANCETS FINE MISC, 1 each by Does not apply route daily. E11.9, Disp: 100 each, Rfl: 3 .  simvastatin (ZOCOR) 20 MG tablet, Take 1 tablet (20 mg total) by mouth at bedtime., Disp: 90 tablet, Rfl: 3 .  vitamin C (ASCORBIC ACID) 500 MG tablet, Take 500 mg by mouth daily. , Disp: , Rfl:  .  VITAMIN E PO, Take 1 tablet by mouth daily., Disp: , Rfl:  .  warfarin (COUMADIN) 5 MG tablet, Take 0.5-1 tablets (2.5-5 mg total) by mouth daily., Disp: 90 tablet, Rfl: 1  EXAM:  Vitals:   08/11/15 0930  BP: 122/82  Pulse: 63  Temp: 98 F (36.7 C)    Body mass index is 28.23 kg/m.  GENERAL: vitals reviewed and listed above, alert, oriented, appears well hydrated and in no acute distress  HEENT: atraumatic, conjunttiva clear, no obvious abnormalities on inspection of external nose and ears, poor dental health, fx tooth at site of pain with mild erythema of gum, no pitting or drainage  NECK: no obvious masses on inspection  LUNGS: clear to auscultation bilaterally, no wheezes, rales or rhonchi, good air movement  CV: HRRR, no peripheral edema  MS: moves all extremities without noticeable abnormality  PSYCH: pleasant and cooperative, no obvious depression or anxiety  ASSESSMENT AND PLAN:  Discussed the following assessment and plan:  Other specified hypothyroidism - Plan: TSH  Controlled type 2 diabetes mellitus with diabetic neuropathy, without long-term current use of insulin (HCC) - Plan: Hemoglobin A1c  Essential hypertension - Plan: Basic metabolic panel  Hyperlipemia  Pain, dental  -labs -flu shot in fall -lifestyle recs -follow up in 3-4 months -provided then number for cash pay emergency dental clinic with daily walk in hours and advised they call today and call coumadin clinic -Patient advised to return or notify a doctor immediately if symptoms worsen or persist or new concerns arise.  Patient Instructions  BEFORE YOU LEAVE: -follow up: in 3-4 months -labs  Call the  dentist for treatment of your tooth pain - see number provided.   We recommend the following healthy lifestyle: 1) Small portions - eat off of salad plate instead of dinner plate 2) Eat a healthy clean diet with avoidance of (less then 1 serving per week) processed foods, sweetened drinks, white starches, red meat, fast foods and sweets and consisting of: * 5-9 servings per day of fresh or frozen fruits and vegetables (not corn or potatoes, not dried or canned) *nuts and seeds, beans *olives and olive oil *small portions of lean meats such as fish and white chicken  *small portions of whole grains 3)Get at least 150 minutes of sweaty aerobic exercise per week 4)reduce stress - counseling, meditation, relaxation to balance other aspects of your life  We have ordered labs or studies at this visit. It can  take up to 1-2 weeks for results and processing. IF results require follow up or explanation, we will call you with instructions. Clinically stable results will be released to your [facility] If you have not heard from [place] or cannot find your results in [facility] in 2 weeks please contact our office at [phone].  If you are not yet signed up for [facility], please consider signing up.          Kriste Basque R., DO

## 2015-08-11 ENCOUNTER — Encounter: Payer: Self-pay | Admitting: Family Medicine

## 2015-08-11 ENCOUNTER — Ambulatory Visit (INDEPENDENT_AMBULATORY_CARE_PROVIDER_SITE_OTHER): Payer: Medicare Other | Admitting: Family Medicine

## 2015-08-11 ENCOUNTER — Telehealth: Payer: Self-pay | Admitting: *Deleted

## 2015-08-11 VITALS — BP 122/82 | HR 63 | Temp 98.0°F | Ht 65.75 in | Wt 173.6 lb

## 2015-08-11 DIAGNOSIS — E038 Other specified hypothyroidism: Secondary | ICD-10-CM | POA: Diagnosis not present

## 2015-08-11 DIAGNOSIS — E1140 Type 2 diabetes mellitus with diabetic neuropathy, unspecified: Secondary | ICD-10-CM

## 2015-08-11 DIAGNOSIS — I10 Essential (primary) hypertension: Secondary | ICD-10-CM

## 2015-08-11 DIAGNOSIS — E785 Hyperlipidemia, unspecified: Secondary | ICD-10-CM | POA: Diagnosis not present

## 2015-08-11 DIAGNOSIS — K0889 Other specified disorders of teeth and supporting structures: Secondary | ICD-10-CM

## 2015-08-11 LAB — BASIC METABOLIC PANEL
BUN: 24 mg/dL — ABNORMAL HIGH (ref 6–23)
CALCIUM: 9.8 mg/dL (ref 8.4–10.5)
CO2: 27 mEq/L (ref 19–32)
Chloride: 103 mEq/L (ref 96–112)
Creatinine, Ser: 1.04 mg/dL (ref 0.40–1.20)
GFR: 66.57 mL/min (ref >60.00)
GLUCOSE: 113 mg/dL — AB (ref 70–99)
Potassium: 4.2 mEq/L (ref 3.5–5.1)
SODIUM: 139 meq/L (ref 135–145)

## 2015-08-11 LAB — TSH: TSH: 1.17 u[IU]/mL (ref 0.35–4.50)

## 2015-08-11 LAB — HEMOGLOBIN A1C: HEMOGLOBIN A1C: 6.6 % — AB (ref 4.6–6.5)

## 2015-08-11 NOTE — Progress Notes (Signed)
Pre visit review using our clinic review tool, if applicable. No additional management support is needed unless otherwise documented below in the visit note. 

## 2015-08-11 NOTE — Telephone Encounter (Signed)
Spoke with pt's daughter Elon Jester and she states she is needing to have extraction of 1 tooth and was wanting to know how long needs to hold coumadin Instructed that usually we do not hold coumadin for extraction of 1 tooth but to talk with dentist and ask him if he wants to hold coumadin and then to send clearance to our coumadin clinic  or if he wants [place] to do INR day of dental extraction we can do that as well and she states understanding. She states at present pt's tooth is infected and pt is starting Clindamycin 150 mg cap 2 stat then 1 cap every 8 hours for 6 days and was given hydrocodone for pain . Pt's daughter notified that Clindamycin and coumadin do not have an interaction and to take coumadin as ordered and to take Clindamycin as ordered and she states understanding

## 2015-08-11 NOTE — Patient Instructions (Addendum)
BEFORE YOU LEAVE: -follow up: in 3-4 months -labs  Call the dentist for treatment of your tooth pain - see number provided.   We recommend the following healthy lifestyle: 1) Small portions - eat off of salad plate instead of dinner plate 2) Eat a healthy clean diet with avoidance of (less then 1 serving per week) processed foods, sweetened drinks, white starches, red meat, fast foods and sweets and consisting of: * 5-9 servings per day of fresh or frozen fruits and vegetables (not corn or potatoes, not dried or canned) *nuts and seeds, beans *olives and olive oil *small portions of lean meats such as fish and white chicken  *small portions of whole grains 3)Get at least 150 minutes of sweaty aerobic exercise per week 4)reduce stress - counseling, meditation, relaxation to balance other aspects of your life  We have ordered labs or studies at this visit. It can take up to 1-2 weeks for results and processing. IF results require follow up or explanation, we will call you with instructions. Clinically stable results will be released to your [facility]. If you have not heard from [place] or cannot find your results in [facility] in 2 weeks please contact our office at [phone].  If you are not yet signed up for [facility], please consider signing up.

## 2015-08-13 ENCOUNTER — Ambulatory Visit (INDEPENDENT_AMBULATORY_CARE_PROVIDER_SITE_OTHER): Payer: Medicare Other | Admitting: *Deleted

## 2015-08-13 DIAGNOSIS — Z7901 Long term (current) use of anticoagulants: Secondary | ICD-10-CM | POA: Diagnosis not present

## 2015-08-13 DIAGNOSIS — I2699 Other pulmonary embolism without acute cor pulmonale: Secondary | ICD-10-CM

## 2015-08-13 DIAGNOSIS — Z5181 Encounter for therapeutic drug level monitoring: Secondary | ICD-10-CM

## 2015-08-13 LAB — POCT INR: INR: 4.9

## 2015-08-20 ENCOUNTER — Telehealth: Payer: Self-pay

## 2015-08-20 NOTE — Telephone Encounter (Signed)
Pt's daughter Elon Jester called states pt is scheduled to have dental extraction on Monday 8/21 or 8/22 depending on INR.  DDS wants INR to be 2.0-2.5. Pt's INR was elevated on 08/13/15 4.9.  Coumadin was held x 2 doses then she resumed her previous dosage 1 tablet daily except 1/2 tablet on Fridays.  Advised daughter since last INR elevated we should check pt's INR tomorrow 08/21/15 to know if she is back in range in order to attempt to dose adjust her down to get INR between 2.0-2.5 for dental extraction on Monday. Pt's daughter agreed made appt for tomorrow 08/21/15 at 3:30pm.

## 2015-08-21 ENCOUNTER — Ambulatory Visit (INDEPENDENT_AMBULATORY_CARE_PROVIDER_SITE_OTHER): Payer: Medicare Other | Admitting: Pharmacist

## 2015-08-21 DIAGNOSIS — I2699 Other pulmonary embolism without acute cor pulmonale: Secondary | ICD-10-CM

## 2015-08-21 DIAGNOSIS — Z7901 Long term (current) use of anticoagulants: Secondary | ICD-10-CM

## 2015-08-21 DIAGNOSIS — Z5181 Encounter for therapeutic drug level monitoring: Secondary | ICD-10-CM

## 2015-08-21 LAB — POCT INR: INR: 2.7

## 2015-08-24 ENCOUNTER — Ambulatory Visit (INDEPENDENT_AMBULATORY_CARE_PROVIDER_SITE_OTHER): Payer: Medicare Other

## 2015-08-24 DIAGNOSIS — I2699 Other pulmonary embolism without acute cor pulmonale: Secondary | ICD-10-CM

## 2015-08-24 DIAGNOSIS — Z5181 Encounter for therapeutic drug level monitoring: Secondary | ICD-10-CM | POA: Diagnosis not present

## 2015-08-24 DIAGNOSIS — Z7901 Long term (current) use of anticoagulants: Secondary | ICD-10-CM

## 2015-08-24 LAB — POCT INR: INR: 1.6

## 2015-09-02 ENCOUNTER — Ambulatory Visit (INDEPENDENT_AMBULATORY_CARE_PROVIDER_SITE_OTHER): Payer: Medicare Other | Admitting: *Deleted

## 2015-09-02 DIAGNOSIS — I2699 Other pulmonary embolism without acute cor pulmonale: Secondary | ICD-10-CM

## 2015-09-02 DIAGNOSIS — Z5181 Encounter for therapeutic drug level monitoring: Secondary | ICD-10-CM | POA: Diagnosis not present

## 2015-09-02 DIAGNOSIS — Z7901 Long term (current) use of anticoagulants: Secondary | ICD-10-CM

## 2015-09-02 LAB — POCT INR: INR: 3.5

## 2015-09-04 DIAGNOSIS — Z933 Colostomy status: Secondary | ICD-10-CM | POA: Diagnosis not present

## 2015-09-04 DIAGNOSIS — Z4801 Encounter for change or removal of surgical wound dressing: Secondary | ICD-10-CM | POA: Diagnosis not present

## 2015-09-04 DIAGNOSIS — E119 Type 2 diabetes mellitus without complications: Secondary | ICD-10-CM | POA: Diagnosis not present

## 2015-09-04 DIAGNOSIS — K5660 Unspecified intestinal obstruction: Secondary | ICD-10-CM | POA: Diagnosis not present

## 2015-09-16 ENCOUNTER — Ambulatory Visit (INDEPENDENT_AMBULATORY_CARE_PROVIDER_SITE_OTHER): Payer: Medicare Other | Admitting: *Deleted

## 2015-09-16 DIAGNOSIS — Z7901 Long term (current) use of anticoagulants: Secondary | ICD-10-CM

## 2015-09-16 DIAGNOSIS — I2699 Other pulmonary embolism without acute cor pulmonale: Secondary | ICD-10-CM | POA: Diagnosis not present

## 2015-09-16 DIAGNOSIS — Z5181 Encounter for therapeutic drug level monitoring: Secondary | ICD-10-CM | POA: Diagnosis not present

## 2015-09-16 LAB — POCT INR: INR: 3.8

## 2015-10-02 ENCOUNTER — Other Ambulatory Visit: Payer: Self-pay | Admitting: Family Medicine

## 2015-10-02 DIAGNOSIS — Z1231 Encounter for screening mammogram for malignant neoplasm of breast: Secondary | ICD-10-CM

## 2015-10-08 ENCOUNTER — Ambulatory Visit (INDEPENDENT_AMBULATORY_CARE_PROVIDER_SITE_OTHER): Payer: Medicare Other | Admitting: *Deleted

## 2015-10-08 DIAGNOSIS — I2699 Other pulmonary embolism without acute cor pulmonale: Secondary | ICD-10-CM | POA: Diagnosis not present

## 2015-10-08 DIAGNOSIS — Z5181 Encounter for therapeutic drug level monitoring: Secondary | ICD-10-CM

## 2015-10-08 DIAGNOSIS — Z7901 Long term (current) use of anticoagulants: Secondary | ICD-10-CM

## 2015-10-08 LAB — POCT INR: INR: 4.2

## 2015-10-16 ENCOUNTER — Ambulatory Visit: Payer: Medicare Other

## 2015-10-22 ENCOUNTER — Ambulatory Visit (INDEPENDENT_AMBULATORY_CARE_PROVIDER_SITE_OTHER): Payer: Medicare Other | Admitting: Pharmacist

## 2015-10-22 DIAGNOSIS — Z5181 Encounter for therapeutic drug level monitoring: Secondary | ICD-10-CM

## 2015-10-22 DIAGNOSIS — I2699 Other pulmonary embolism without acute cor pulmonale: Secondary | ICD-10-CM

## 2015-10-22 DIAGNOSIS — Z7901 Long term (current) use of anticoagulants: Secondary | ICD-10-CM | POA: Diagnosis not present

## 2015-10-22 LAB — POCT INR: INR: 3

## 2015-10-23 ENCOUNTER — Ambulatory Visit
Admission: RE | Admit: 2015-10-23 | Discharge: 2015-10-23 | Disposition: A | Discharge status: Home / Self Care | Payer: Medicare Other | Source: Ambulatory Visit | Attending: Family Medicine | Admitting: Family Medicine

## 2015-10-23 DIAGNOSIS — Z1231 Encounter for screening mammogram for malignant neoplasm of breast: Secondary | ICD-10-CM | POA: Diagnosis not present

## 2015-10-27 ENCOUNTER — Ambulatory Visit (INDEPENDENT_AMBULATORY_CARE_PROVIDER_SITE_OTHER): Payer: Medicare Other | Admitting: Sports Medicine

## 2015-10-27 DIAGNOSIS — E0840 Diabetes mellitus due to underlying condition with diabetic neuropathy, unspecified: Secondary | ICD-10-CM

## 2015-10-27 DIAGNOSIS — M79676 Pain in unspecified toe(s): Secondary | ICD-10-CM

## 2015-10-27 DIAGNOSIS — L84 Corns and callosities: Secondary | ICD-10-CM | POA: Diagnosis not present

## 2015-10-27 DIAGNOSIS — B351 Tinea unguium: Secondary | ICD-10-CM | POA: Diagnosis not present

## 2015-10-27 DIAGNOSIS — R0989 Other specified symptoms and signs involving the circulatory and respiratory systems: Secondary | ICD-10-CM

## 2015-10-27 NOTE — Progress Notes (Signed)
Patient ID: Hannah [name], female   DOB: 03-23-1940, 75 y.o.   MRN: [id number]   Subjective: Hannah [name] is a 75 y.o. female patient with history of diabetes who returns to office today complaining of long, painful nails  while ambulating in shoes; unable to trim. Patient states that the glucose reading this morning was 114 mg/dl. Patient denies any new changes in medication or new problems; Coumadin dose changed based on her INR. Patient denies any new cramping, numbness, burning or tingling in the legs.  Patient Active Problem List   Diagnosis Date Noted  . Encounter for therapeutic drug monitoring 05/04/2015  . Pulmonary embolism ([place]) 02/12/2015  . Neuropathy ([place]) 02/12/2015  . Osteoarthritis 08/12/2014  . Normocytic anemia 06/08/2014  . Hypothyroid 06/06/2013  . Hypertension 06/06/2013  . Diabetes mellitus with neurological manifestations, controlled ([place]) 06/06/2013  . Hyperlipemia 02/25/2013  . Long term current use of anticoagulant therapy 07/27/2012   Current Outpatient Prescriptions on File Prior to Visit  Medication Sig Dispense Refill  . acetaminophen (TYLENOL) 500 MG tablet Take 500 mg by mouth every 6 (six) hours as needed (pain).    . Cholecalciferol (VITAMIN D PO) Take 1 tablet by mouth daily.    Marland Kitchen glucose blood (ONETOUCH VERIO) test strip 1 each by Other route daily. Dx E11.9    . glucose blood (ONETOUCH VERIO) test strip Use as instructed to check blood sugar once a day 100 each 3  . levothyroxine (SYNTHROID, LEVOTHROID) 25 MCG tablet Take 1 tablet by mouth  daily before breakfast 90 tablet 3  . metFORMIN (GLUCOPHAGE) 1000 MG tablet 1049m (1 tablet) with breakfast and 10019m(1 tablet) with dinner. 180 tablet 3  . metoprolol tartrate (LOPRESSOR) 25 MG tablet Take 1 tablet (25 mg total) by mouth 2 (two) times daily. 180 tablet 3  . ONETOUCH DELICA LANCETS FINE MISC 1 each by Does not apply route daily. E11.9 100 each 3  . simvastatin (ZOCOR) 20 MG tablet Take 1 tablet (20  mg total) by mouth at bedtime. 90 tablet 3  . vitamin C (ASCORBIC ACID) 500 MG tablet Take 500 mg by mouth daily.     . Marland KitchenITAMIN E PO Take 1 tablet by mouth daily.    . Marland Kitchenarfarin (COUMADIN) 5 MG tablet Take 0.5-1 tablets (2.5-5 mg total) by mouth daily. 90 tablet 1   No current facility-administered medications on file prior to visit.    Allergies  Allergen Reactions  . Augmentin [Amoxicillin-Pot Clavulanate] Other (See Comments)    Severe vaginal itching  . Tranxene [Clorazepate] Itching    Recent Results (from the past 2160 hour(s))  Hemoglobin A1c     Status: Abnormal   Collection Time: 08/11/15 10:08 AM  Result Value Ref Range   Hgb A1c MFr Bld 6.6 (H) 4.6 - 6.5 %    Comment: Glycemic Control Guidelines for People with Diabetes:Non Diabetic:  <6%Goal of Therapy: <7%Additional Action Suggested:  >8>6% Basic metabolic panel     Status: Abnormal   Collection Time: 08/11/15 10:08 AM  Result Value Ref Range   Sodium 139 135 - 145 mEq/L   Potassium 4.2 3.5 - 5.1 mEq/L   Chloride 103 96 - 112 mEq/L   CO2 27 19 - 32 mEq/L   Glucose, Bld 113 (H) 70 - 99 mg/dL   BUN 24 (H) 6 - 23 mg/dL   Creatinine, Ser 1.04 0.40 - 1.20 mg/dL   Calcium 9.8 8.4 - 10.5 mg/dL   GFR 66.57 >60.00 mL/min  TSH     Status: None   Collection Time: 08/11/15 10:08 AM  Result Value Ref Range   TSH 1.17 0.35 - 4.50 uIU/mL  POCT INR     Status: None   Collection Time: 08/13/15  9:04 AM  Result Value Ref Range   INR 4.9   POCT INR     Status: None   Collection Time: 08/21/15  3:41 PM  Result Value Ref Range   INR 2.7   POCT INR     Status: None   Collection Time: 08/24/15  9:44 AM  Result Value Ref Range   INR 1.6   POCT INR     Status: None   Collection Time: 09/02/15  3:11 PM  Result Value Ref Range   INR 3.5   POCT INR     Status: None   Collection Time: 09/16/15  2:53 PM  Result Value Ref Range   INR 3.8   POCT INR     Status: None   Collection Time: 10/08/15  2:12 PM  Result Value Ref Range    INR 4.2   POCT INR     Status: None   Collection Time: 10/22/15  2:39 PM  Result Value Ref Range   INR 3.0     Objective: General: Patient is awake, alert, and oriented x 3 and in no acute distress.  Integument: Skin is warm, dry and supple bilateral. Nails are tender, long, thickened and dystrophic with subungual debris, consistent with onychomycosis, 1-5 bilateral. No signs of infection. No open lesions or preulcerative lesions present bilateral. Previous bunion and hammertoe correction with old scars bilateral. + callus sub met 3 on right and medial hallux on left. Remaining integument unremarkable.  Vasculature:  Dorsalis Pedis pulse 1/4 bilateral. Posterior Tibial pulse  1/4 bilateral. Capillary fill time <3 sec 1-5 bilateral. No hair growth to the level of the digits. Temperature gradient within normal limits. No varicosities present bilateral. No edema present bilateral.   Neurology: The patient has intact sensation measured with a 5.07/10g Semmes Weinstein Monofilament at all pedal sites bilateral . Vibratory sensation diminished bilateral with tuning fork. No Babinski sign present bilateral.   Musculoskeletal: No gross pedal deformities noted bilateral. Muscular strength 5/5 in all lower extremity muscular groups bilateral without pain on range of motion . No tenderness with calf compression bilateral.  Assessment and Plan: Problem List Items Addressed This Visit    None    Visit Diagnoses    Dermatophytosis of nail    -  Primary   Pain of toe, unspecified laterality       Decreased pedal pulses       Diabetes mellitus due to underlying condition, controlled, with diabetic neuropathy, unspecified long term insulin use status ([place])         -Examined patient. -Discussed and educated patient on diabetic foot care, especially with  regards to the vascular, neurological and musculoskeletal systems.  -Stressed the importance of good glycemic control and the detriment of not   controlling glucose levels in relation to the foot. -Mechanically debrided callus using sterile chisel blade and all nails 1-5 bilateral using sterile nail nipper and filed with dremel without incident  -Answered all patient questions -Patient to return  in 3 months for at risk foot care -Patient advised to call the office if any problems or questions arise in the Meantime.  Landis Martins, DPM

## 2015-11-12 ENCOUNTER — Ambulatory Visit (INDEPENDENT_AMBULATORY_CARE_PROVIDER_SITE_OTHER): Payer: Medicare Other | Admitting: *Deleted

## 2015-11-12 DIAGNOSIS — Z5181 Encounter for therapeutic drug level monitoring: Secondary | ICD-10-CM | POA: Diagnosis not present

## 2015-11-12 DIAGNOSIS — I2699 Other pulmonary embolism without acute cor pulmonale: Secondary | ICD-10-CM

## 2015-11-12 DIAGNOSIS — Z7901 Long term (current) use of anticoagulants: Secondary | ICD-10-CM

## 2015-11-12 LAB — POCT INR: INR: 2.9

## 2015-11-16 ENCOUNTER — Other Ambulatory Visit: Payer: Self-pay

## 2015-11-16 MED ORDER — WARFARIN SODIUM 5 MG PO TABS: ORAL_TABLET | ORAL | 1 refills | Status: DC

## 2015-12-04 DIAGNOSIS — K56609 Unspecified intestinal obstruction, unspecified as to partial versus complete obstruction: Secondary | ICD-10-CM | POA: Diagnosis not present

## 2015-12-04 DIAGNOSIS — Z4801 Encounter for change or removal of surgical wound dressing: Secondary | ICD-10-CM | POA: Diagnosis not present

## 2015-12-04 DIAGNOSIS — E119 Type 2 diabetes mellitus without complications: Secondary | ICD-10-CM | POA: Diagnosis not present

## 2015-12-04 DIAGNOSIS — Z933 Colostomy status: Secondary | ICD-10-CM | POA: Diagnosis not present

## 2015-12-09 DIAGNOSIS — Z933 Colostomy status: Secondary | ICD-10-CM | POA: Diagnosis not present

## 2015-12-09 DIAGNOSIS — Z4801 Encounter for change or removal of surgical wound dressing: Secondary | ICD-10-CM | POA: Diagnosis not present

## 2015-12-09 DIAGNOSIS — K56609 Unspecified intestinal obstruction, unspecified as to partial versus complete obstruction: Secondary | ICD-10-CM | POA: Diagnosis not present

## 2015-12-09 DIAGNOSIS — E119 Type 2 diabetes mellitus without complications: Secondary | ICD-10-CM | POA: Diagnosis not present

## 2015-12-10 ENCOUNTER — Ambulatory Visit (INDEPENDENT_AMBULATORY_CARE_PROVIDER_SITE_OTHER): Payer: Medicare Other | Admitting: Pharmacist

## 2015-12-10 DIAGNOSIS — Z7901 Long term (current) use of anticoagulants: Secondary | ICD-10-CM | POA: Diagnosis not present

## 2015-12-10 DIAGNOSIS — Z5181 Encounter for therapeutic drug level monitoring: Secondary | ICD-10-CM | POA: Diagnosis not present

## 2015-12-10 DIAGNOSIS — I2699 Other pulmonary embolism without acute cor pulmonale: Secondary | ICD-10-CM | POA: Diagnosis not present

## 2015-12-10 LAB — POCT INR: INR: 2.4

## 2015-12-11 ENCOUNTER — Encounter: Payer: Self-pay | Admitting: Family Medicine

## 2015-12-11 ENCOUNTER — Ambulatory Visit (INDEPENDENT_AMBULATORY_CARE_PROVIDER_SITE_OTHER): Payer: Medicare Other | Admitting: Family Medicine

## 2015-12-11 VITALS — BP 132/88 | HR 80 | Temp 97.7°F | Ht 65.75 in | Wt 178.4 lb

## 2015-12-11 DIAGNOSIS — I10 Essential (primary) hypertension: Secondary | ICD-10-CM | POA: Diagnosis not present

## 2015-12-11 DIAGNOSIS — I2699 Other pulmonary embolism without acute cor pulmonale: Secondary | ICD-10-CM

## 2015-12-11 DIAGNOSIS — Z6829 Body mass index (BMI) 29.0-29.9, adult: Secondary | ICD-10-CM

## 2015-12-11 DIAGNOSIS — E1140 Type 2 diabetes mellitus with diabetic neuropathy, unspecified: Secondary | ICD-10-CM | POA: Diagnosis not present

## 2015-12-11 DIAGNOSIS — E038 Other specified hypothyroidism: Secondary | ICD-10-CM | POA: Diagnosis not present

## 2015-12-11 LAB — CBC
HCT: 43 % (ref 36.0–46.0)
Hemoglobin: 13.7 g/dL (ref 12.0–15.0)
MCHC: 31.9 g/dL (ref 30.0–36.0)
MCV: 88.3 fl (ref 78.0–100.0)
PLATELETS: 195 10*3/uL (ref 150.0–400.0)
RBC: 4.87 Mil/uL (ref 3.87–5.11)
RDW: 15.7 % — ABNORMAL HIGH (ref 11.5–15.5)
WBC: 10.5 10*3/uL (ref 4.0–10.5)

## 2015-12-11 LAB — LIPID PANEL
CHOL/HDL RATIO: 2
CHOLESTEROL: 162 mg/dL (ref 0–200)
HDL: 73.9 mg/dL (ref >39.00)
LDL CALC: 75 mg/dL (ref 0–99)
NonHDL: 88.03
TRIGLYCERIDES: 65 mg/dL (ref 0.0–149.0)
VLDL: 13 mg/dL (ref 0.0–40.0)

## 2015-12-11 LAB — BASIC METABOLIC PANEL
BUN: 14 mg/dL (ref 6–23)
CALCIUM: 9.8 mg/dL (ref 8.4–10.5)
CHLORIDE: 105 meq/L (ref 96–112)
CO2: 30 mEq/L (ref 19–32)
CREATININE: 0.94 mg/dL (ref 0.40–1.20)
GFR: 74.74 mL/min (ref >60.00)
Glucose, Bld: 107 mg/dL — ABNORMAL HIGH (ref 70–99)
Potassium: 3.8 mEq/L (ref 3.5–5.1)
SODIUM: 142 meq/L (ref 135–145)

## 2015-12-11 LAB — TSH: TSH: 0.81 u[IU]/mL (ref 0.35–4.50)

## 2015-12-11 LAB — HEMOGLOBIN A1C: HEMOGLOBIN A1C: 6.6 % — AB (ref 4.6–6.5)

## 2015-12-11 NOTE — Progress Notes (Signed)
HPI:  Hannah [name] is a pleasant 43 with a PMH significant for hypothyroidism, DM w/ neuropathy, Hx of PE on chronic coumadin, HTN and HLD here for follow up. She sees a cardiologist, Dr. Delton See and goes to the cardiology coumadin clinic. She reports she is doing well without any significant complaints. Denies CP, SOB, DOE, hypoglycemia.. A summary of her medical problems is listed below. Due for labs.  Hypothyroidism:  -on synthroid chronically    DM w/ neuropathy:  -current meds:metformin 1000mg  bid; used to take glipizide -see podiatrist -lipids done 02/2015 and at goal  Hx PE and cardiogenic shock, HTN, HLD:  -followed by Dr. Tenny Craw in Cardiology  -on coumadin managed by cards coumadin clinic, statin, metoprolol   She lives alone, but has lifeline and family members whom check on her daily and help with meals  Knee and Hip OA s/p hip and knee replacements: -uses [employer] transportation -Saw Dr. Luiz Blare at [facility] orthopedics in the past -has long hx of severe pain in hips and knees when walks more then a few blocks or stands for long periods of time, has difficulty boarding a bus or walking ups hills -uses walker and cane to ambulate  ROS: See pertinent positives and negatives per HPI.  Past Medical History:  Diagnosis Date  . Acute massive pulmonary embolism (HCC) 07/13/2012   Massive PE w/ PEA arrest 07/13/12 >TNK >IVC filter >discharged on comadin    . Diabetes mellitus, type 2 (HCC) 07/15/2012   with peripheral neuropathy  . Hyperlipemia 07/14/2012  . Hypertension 07/14/2012  . Large bowel stricture    s/p colectomy in 2016  . Osteoarthritis    s/p hip and knee replacements  . Thyroid disease     Past Surgical History:  Procedure Laterality Date  . COLOSTOMY N/A 06/03/2014   Procedure: COLOSTOMY;  Surgeon: Harriette Bouillon, MD;  Location: [facility] OR;  Service: General;  Laterality: N/A;  . FLEXIBLE SIGMOIDOSCOPY N/A 05/30/2014   Procedure: Arnell Sieving;   Surgeon: Jeani Hawking, MD;  Location: [facility] ENDOSCOPY;  Service: Endoscopy;  Laterality: N/A;  . INSERTION OF VENA CAVA FILTER N/A 07/16/2012   Procedure: INSERTION OF VENA CAVA FILTER;  Surgeon: [name], MD;  Location: [facility] CATH LAB;  Service: Cardiovascular;  Laterality: N/A;  . KNEE ARTHROSCOPY    . PARTIAL COLECTOMY N/A 06/03/2014   Procedure: PARTIAL COLECTOMY;  Surgeon: Harriette Bouillon, MD;  Location: [facility] OR;  Service: General;  Laterality: N/A;  . SP ARTHRO HIP*L*      No family history on file.  Social History   Social History  . Marital status: Widowed    Spouse name: N/A  . Number of children: N/A  . Years of education: N/A   Social History Main Topics  . Smoking status: Former Smoker    Packs/day: 1.00    Years: 10.00    Types: Cigarettes    Quit date: 01/04/1968  . Smokeless tobacco: Never Used  . Alcohol use No  . Drug use: No  . Sexual activity: Not Asked   Other Topics Concern  . None   Social History Narrative   Lives alone.            Current Outpatient Prescriptions:  .  acetaminophen (TYLENOL) 500 MG tablet, Take 500 mg by mouth every 6 (six) hours as needed (pain)., Disp: , Rfl:  .  glucose blood (ONETOUCH VERIO) test strip, 1 each by Other route daily. Dx E11.9, Disp: , Rfl:  .  glucose blood (  ONETOUCH VERIO) test strip, Use as instructed to check blood sugar once a day, Disp: 100 each, Rfl: 3 .  levothyroxine (SYNTHROID, LEVOTHROID) 25 MCG tablet, Take 1 tablet by mouth  daily before breakfast, Disp: 90 tablet, Rfl: 3 .  metFORMIN (GLUCOPHAGE) 1000 MG tablet, 1000mg  (1 tablet) with breakfast and 1000mg  (1 tablet) with dinner., Disp: 180 tablet, Rfl: 3 .  metoprolol tartrate (LOPRESSOR) 25 MG tablet, Take 1 tablet (25 mg total) by mouth 2 (two) times daily., Disp: 180 tablet, Rfl: 3 .  ONETOUCH DELICA LANCETS FINE MISC, 1 each by Does not apply route daily. E11.9, Disp: 100 each, Rfl: 3 .  simvastatin (ZOCOR) 20 MG tablet, Take 1 tablet (20 mg total)  by mouth at bedtime., Disp: 90 tablet, Rfl: 3 .  vitamin C (ASCORBIC ACID) 500 MG tablet, Take 500 mg by mouth daily. , Disp: , Rfl:  .  warfarin (COUMADIN) 5 MG tablet, Take as directed by Coumadin Clinic, Disp: 90 tablet, Rfl: 1  EXAM:  Vitals:   12/11/15 0926  BP: 132/88  Pulse: 80  Temp: 97.7 F (36.5 C)    Body mass index is 29.01 kg/m.  GENERAL: vitals reviewed and listed above, alert, oriented, appears well hydrated and in no acute distress  HEENT: atraumatic, conjunttiva clear, no obvious abnormalities on inspection of external nose and ears  NECK: no obvious masses on inspection  LUNGS: clear to auscultation bilaterally, no wheezes, rales or rhonchi, good air movement  CV: HRRR, no peripheral edema  MS: moves all extremities without noticeable abnormality  PSYCH: pleasant and cooperative, no obvious depression or anxiety  ASSESSMENT AND PLAN:  Discussed the following assessment and plan:  Controlled type 2 diabetes mellitus with diabetic neuropathy, without long-term current use of insulin (HCC) - Plan: Hemoglobin A1c, Lipid Panel  Essential hypertension - Plan: Basic metabolic panel, CBC (no diff)  Other specified hypothyroidism - Plan: TSH  Other pulmonary embolism without acute cor pulmonale, unspecified chronicity (HCC)  BMI 29.0-29.9,adult  -she wants FASTING labs today -lifestyle recs -cont current medications -Medicare exam with Darl PikesSusan and follow up with Dr. Selena BattenKim in April. -Patient advised to return or notify a doctor immediately if symptoms worsen or persist or new concerns arise.  Patient Instructions  BEFORE YOU LEAVE: -follow up: 1) Medicare Wellness Exam with Montine CircleSusan Hauck in about 4 months - will NOT need to be fasting 2) follow up with Dr. Selena BattenKim the same day if possible or another day if more convenient for patient  We have ordered labs or studies at this visit. It can take up to 1-2 weeks for results and processing. IF results require follow  up or explanation, we will call you with instructions. Clinically stable results will be released to your [facility]. If you have not heard from us or cannot find your results in [facility] in 2 weeks please contact our office at [phone].  If you are not yet signed up for [facility], please consider signing up.   We recommend the following healthy lifestyle for LIFE: 1) Small portions.   Tip: eat off of a salad plate instead of a dinner plate.  Tip: if you need more or a snack choose fruits, veggies and/or a handful of nuts or seeds.  2) Eat a healthy clean diet.  * Tip: Avoid (less then 1 serving per week): processed foods, sweets, sweetened drinks, white starches (rice, flour, bread, potatoes, pasta, etc), red meat, fast foods, butter  *Tip: CHOOSE instead   * 5-9 servings  per day of fresh or frozen fruits and vegetables (but not corn, potatoes, bananas, canned or dried fruit)   *nuts and seeds, beans   *olives and olive oil   *small portions of lean meats such as fish and white chicken    *small portions of whole grains  3)Get at least 150 minutes of sweaty aerobic exercise per week.  4)Reduce stress - consider counseling, meditation and relaxation to balance other aspects of your life.        Kriste Basque R., DO

## 2015-12-11 NOTE — Progress Notes (Signed)
Pre visit review using our clinic review tool, if applicable. No additional management support is needed unless otherwise documented below in the visit note. 

## 2015-12-11 NOTE — Patient Instructions (Addendum)
BEFORE YOU LEAVE: -follow up: 1) Medicare Wellness Exam with Montine Circle in about 4 months - will NOT need to be fasting 2) follow up with Dr. [name] the same day if possible or another day if more convenient for patient  We have ordered labs or studies at this visit. It can take up to 1-2 weeks for results and processing. IF results require follow up or explanation, we will call you with instructions. Clinically stable results will be released to your [facility]. If you have not heard from [place] or cannot find your results in [facility] in 2 weeks please contact our office at [phone].  If you are not yet signed up for [facility], please consider signing up.   We recommend the following healthy lifestyle for LIFE: 1) Small portions.   Tip: eat off of a salad plate instead of a dinner plate.  Tip: if you need more or a snack choose fruits, veggies and/or a handful of nuts or seeds.  2) Eat a healthy clean diet.  * Tip: Avoid (less then 1 serving per week): processed foods, sweets, sweetened drinks, white starches (rice, flour, bread, potatoes, pasta, etc), red meat, fast foods, butter  *Tip: CHOOSE instead   * 5-9 servings per day of fresh or frozen fruits and vegetables (but not corn, potatoes, bananas, canned or dried fruit)   *nuts and seeds, beans   *olives and olive oil   *small portions of lean meats such as fish and white chicken    *small portions of whole grains  3)Get at least 150 minutes of sweaty aerobic exercise per week.  4)Reduce stress - consider counseling, meditation and relaxation to balance other aspects of your life.

## 2015-12-16 ENCOUNTER — Emergency Department (HOSPITAL_COMMUNITY): Payer: Medicare Other

## 2015-12-16 ENCOUNTER — Emergency Department (HOSPITAL_COMMUNITY)
Admission: EM | Admit: 2015-12-16 | Discharge: 2015-12-17 | Disposition: A | Discharge status: Home / Self Care | Payer: Medicare Other | Attending: Emergency Medicine | Admitting: Emergency Medicine

## 2015-12-16 ENCOUNTER — Encounter (HOSPITAL_COMMUNITY): Payer: Self-pay | Admitting: Emergency Medicine

## 2015-12-16 DIAGNOSIS — Z96642 Presence of left artificial hip joint: Secondary | ICD-10-CM | POA: Insufficient documentation

## 2015-12-16 DIAGNOSIS — I10 Essential (primary) hypertension: Secondary | ICD-10-CM | POA: Diagnosis not present

## 2015-12-16 DIAGNOSIS — Z87891 Personal history of nicotine dependence: Secondary | ICD-10-CM | POA: Insufficient documentation

## 2015-12-16 DIAGNOSIS — Z7901 Long term (current) use of anticoagulants: Secondary | ICD-10-CM | POA: Diagnosis not present

## 2015-12-16 DIAGNOSIS — E119 Type 2 diabetes mellitus without complications: Secondary | ICD-10-CM | POA: Diagnosis not present

## 2015-12-16 DIAGNOSIS — Z7984 Long term (current) use of oral hypoglycemic drugs: Secondary | ICD-10-CM | POA: Insufficient documentation

## 2015-12-16 DIAGNOSIS — M25551 Pain in right hip: Secondary | ICD-10-CM | POA: Insufficient documentation

## 2015-12-16 LAB — URINALYSIS, ROUTINE W REFLEX MICROSCOPIC
Bacteria, UA: NONE SEEN
Bilirubin Urine: NEGATIVE
GLUCOSE, UA: NEGATIVE mg/dL
Hgb urine dipstick: NEGATIVE
Ketones, ur: NEGATIVE mg/dL
Nitrite: NEGATIVE
Protein, ur: 100 mg/dL — AB
Specific Gravity, Urine: 1.024 (ref 1.005–1.030)
pH: 5 (ref 5.0–8.0)

## 2015-12-16 MED ORDER — OXYCODONE-ACETAMINOPHEN 5-325 MG PO TABS
1.0000 | ORAL_TABLET | Freq: Once | ORAL | Status: AC
  Administered 2015-12-16: 1 via ORAL
  Filled 2015-12-16: qty 1

## 2015-12-16 NOTE — ED Notes (Signed)
Pt states she she got home from church and when she got undressed she felt sharp pain in her right hip. Pt denies any injury.

## 2015-12-16 NOTE — ED Notes (Signed)
Patient transported to X-ray 

## 2015-12-16 NOTE — ED Provider Notes (Signed)
[facility]-EMERGENCY DEPT Provider Note   CSN: [id number] Arrival date & time: 12/16/15  2039  By signing my name below, I, Clovis Pu, attest that this documentation has been prepared under the direction and in the presence of Shon Baton, MD  Electronically Signed: Clovis Pu, ED Scribe. 12/16/15. 11:24 PM.  History   Chief Complaint Chief Complaint  Patient presents with  . Hip Pain   The history is provided by the patient. No language interpreter was used.   HPI Comments:  Hannah [name] is a 75 y.o. female, with a hx of hip replacement surgery, who presents to the Emergency Department complaining of sudden onset, moderate, "20/10" right hip pain which began in the AM today. Pt states she was taking her clothes off when her pain suddenly began as she tried to stand up. Her pain is worse upon palpation and movement of her right leg. She states her pain does not radiate. No alleviating factors noted. Pt denies any injuries, fevers, nausea, vomiting, diarrhea, urinary symptoms, any other associated symptoms and modifying factors at this time. She is ambulatory for short time.   Past Medical History:  Diagnosis Date  . Acute massive pulmonary embolism (HCC) 07/13/2012   Massive PE w/ PEA arrest 07/13/12 >TNK >IVC filter >discharged on comadin    . Diabetes mellitus, type 2 (HCC) 07/15/2012   with peripheral neuropathy  . Hyperlipemia 07/14/2012  . Hypertension 07/14/2012  . Large bowel stricture    s/p colectomy in 2016  . Osteoarthritis    s/p hip and knee replacements  . Thyroid disease     Patient Active Problem List   Diagnosis Date Noted  . Encounter for therapeutic drug monitoring 05/04/2015  . Pulmonary embolism (HCC) 02/12/2015  . Neuropathy (HCC) 02/12/2015  . Osteoarthritis 08/12/2014  . Normocytic anemia 06/08/2014  . Hypothyroid 06/06/2013  . Hypertension 06/06/2013  . Diabetes mellitus with neurological manifestations, controlled (HCC) 06/06/2013  .  Hyperlipemia 02/25/2013  . Long term current use of anticoagulant therapy 07/27/2012    Past Surgical History:  Procedure Laterality Date  . COLOSTOMY N/A 06/03/2014   Procedure: COLOSTOMY;  Surgeon: Harriette Bouillon, MD;  Location: [facility] OR;  Service: General;  Laterality: N/A;  . FLEXIBLE SIGMOIDOSCOPY N/A 05/30/2014   Procedure: Arnell Sieving;  Surgeon: Jeani Hawking, MD;  Location: [facility] ENDOSCOPY;  Service: Endoscopy;  Laterality: N/A;  . INSERTION OF VENA CAVA FILTER N/A 07/16/2012   Procedure: INSERTION OF VENA CAVA FILTER;  Surgeon: [name], MD;  Location: [facility] CATH LAB;  Service: Cardiovascular;  Laterality: N/A;  . KNEE ARTHROSCOPY    . PARTIAL COLECTOMY N/A 06/03/2014   Procedure: PARTIAL COLECTOMY;  Surgeon: Harriette Bouillon, MD;  Location: [facility] OR;  Service: General;  Laterality: N/A;  . SP ARTHRO HIP*L*      OB History    No data available       Home Medications    Prior to Admission medications   Medication Sig Start Date End Date Taking? Authorizing Provider  acetaminophen (TYLENOL) 500 MG tablet Take 500 mg by mouth every 6 (six) hours as needed (pain).   Yes Historical Provider, MD  levothyroxine (SYNTHROID, LEVOTHROID) 25 MCG tablet Take 1 tablet by mouth  daily before breakfast 04/10/15  Yes [name], DO  metFORMIN (GLUCOPHAGE) 1000 MG tablet 1000mg  (1 tablet) with breakfast and 1000mg  (1 tablet) with dinner. Patient taking differently: Take 1,000 mg by mouth 2 (two) times daily with a meal.  04/10/15  Yes Damita Lack  Kim, DO  metoprolol tartrate (LOPRESSOR) 25 MG tablet Take 1 tablet (25 mg total) by mouth 2 (two) times daily. 04/10/15  Yes [name], DO  simvastatin (ZOCOR) 20 MG tablet Take 1 tablet (20 mg total) by mouth at bedtime. 04/10/15  Yes [name], DO  vitamin C (ASCORBIC ACID) 500 MG tablet Take 500 mg by mouth daily.    Yes Historical Provider, MD  warfarin (COUMADIN) 5 MG tablet Take as directed by Coumadin Clinic Patient taking differently: Take  2.5-5 mg by mouth See admin instructions. Take 2.5 mg on Tuesday and Friday then take 1 tablet all the other days 11/16/15  Yes Kalman Shan, MD  glucose blood (ONETOUCH VERIO) test strip 1 each by Other route daily. Dx E11.9    Historical Provider, MD  glucose blood (ONETOUCH VERIO) test strip Use as instructed to check blood sugar once a day 04/10/15   [name], DO  HYDROcodone-acetaminophen (NORCO/VICODIN) 5-325 MG tablet Take 1 tablet by mouth every 6 (six) hours as needed. 12/17/15   Shon Baton, MD  [facility] DELICA LANCETS FINE MISC 1 each by Does not apply route daily. E11.9 04/10/15   [name], DO    Family History No family history on file.  Social History Social History  Substance Use Topics  . Smoking status: Former Smoker    Packs/day: 1.00    Years: 10.00    Types: Cigarettes    Quit date: 01/04/1968  . Smokeless tobacco: Never Used  . Alcohol use No     Allergies   Augmentin [amoxicillin-pot clavulanate] and Tranxene [clorazepate]   Review of Systems Review of Systems  Constitutional: Negative for fever.  Gastrointestinal: Negative for diarrhea, nausea and vomiting.  Genitourinary: Negative for difficulty urinating.  Musculoskeletal: Positive for arthralgias.  All other systems reviewed and are negative.  Physical Exam Updated Vital Signs BP 165/68   Pulse 72   Temp 98 F (36.7 C) (Oral)   Resp 18   Ht 5\' 5"  (1.651 m)   Wt 174 lb (78.9 kg)   SpO2 100%   BMI 28.96 kg/m   Physical Exam  Constitutional: She is oriented to person, place, and time. She appears well-developed and well-nourished. No distress.  HENT:  Head: Normocephalic and atraumatic.  Cardiovascular: Normal rate, regular rhythm and normal heart sounds.   Pulmonary/Chest: Effort normal and breath sounds normal. No respiratory distress. She has no wheezes.  Abdominal: Soft. There is no tenderness.  Musculoskeletal:  Limited range of motion of the right hip secondary to pain,  no obvious deformity, tenderness palpation over the right iliac crest superior to the hip joint, no overlying skin changes, neurovascularly intact distally  Neurological: She is alert and oriented to person, place, and time.  Skin: Skin is warm and dry.  Psychiatric: She has a normal mood and affect.  Nursing note and vitals reviewed.    ED Treatments / Results  DIAGNOSTIC STUDIES:  Oxygen Saturation is 100% on RA, normal by my interpretation.    COORDINATION OF CARE:  11:22 PM Discussed treatment plan with pt at bedside and pt agreed to plan.  Labs (all labs ordered are listed, but only abnormal results are displayed) Labs Reviewed  URINALYSIS, ROUTINE W REFLEX MICROSCOPIC - Abnormal; Notable for the following:       Result Value   APPearance CLOUDY (*)    Protein, ur 100 (*)    Leukocytes, UA TRACE (*)    Squamous Epithelial / LPF  0-5 (*)    All other components within normal limits    EKG  EKG Interpretation None       Radiology Ct Pelvis Wo Contrast  Result Date: 12/17/2015 CLINICAL DATA:  RIGHT pelvic pain. Acute onset moderate RIGHT hip pain beginning this morning. History of partial colectomy LEFT hip arthroplasty. EXAM: CT PELVIS WITHOUT CONTRAST TECHNIQUE: Multidetector CT imaging of the pelvis was performed following the standard protocol without intravenous contrast. COMPARISON:  RIGHT hip radiograph December 16, 2014 and CT abdomen and pelvis October 12, 2014 FINDINGS: Urinary Tract:  Partially distended, normal. Bowel: LEFT colostomy. Mild colonic diverticulosis. Normal appearance of Hartmann's pouch. Multiple loops of small bowel apposed to the anterior abdomen associated with postsurgical changes equivocal for adhesions. Normal appendix. Vascular/Lymphatic: Inferior vena cava filter. Moderate calcific atherosclerosis of the aortoiliac vessels. Reproductive:  Status post hysterectomy. Other: No acute fracture with particular attention to the RIGHT hip. Osteopenia  with out destructive bony lesions. Moderate L5-S1 degenerative discs with severe facet arthropathy. Severe RIGHT L5-S1 neural foraminal narrowing. Moderate to severe canal stenosis L4-5. Status post LEFT hip total arthroplasty. Musculoskeletal: Anterior abdominal wall scarring. Symmetric appearance of the hip musculature. No significant bursal fluid collections. IMPRESSION: No acute osseous process. Severe degenerative change of lumbar spine resulting in severe RIGHT L5-S1 neural foraminal narrowing. Moderate to severe L4-5 canal stenosis. Unchanged appearance of LEFT colostomy and Hartman's pouch. Electronically Signed   By: Awilda Metroourtnay  Bloomer M.D.   On: 12/17/2015 02:20   Dg Hip Unilat W Or Wo Pelvis 2-3 Views Right  Result Date: 12/16/2015 CLINICAL DATA:  Right posterior and lateral hip pain EXAM: DG HIP (WITH OR WITHOUT PELVIS) 2-3V RIGHT COMPARISON:  01/28/2014 FINDINGS: There is an IVC filter at L3-L4. SI joints appear symmetric. Status post left hip replacement without acute dislocation. No acute fracture or dislocation. Pubic symphysis appears intact. Probable curvilinear artifact overlies the left superior ramus. Ostomy left lower quadrant. IMPRESSION: No acute osseous abnormality Electronically Signed   By: Jasmine PangKim  Fujinaga M.D.   On: 12/16/2015 23:54    Procedures Procedures (including critical care time)  Medications Ordered in ED Medications  oxyCODONE-acetaminophen (PERCOCET/ROXICET) 5-325 MG per tablet 1 tablet (1 tablet Oral Given 12/16/15 2330)     Initial Impression / Assessment and Plan / ED Course  I have reviewed the triage vital signs and the nursing notes.  Pertinent labs & imaging results that were available during my care of the patient were reviewed by me and considered in my medical decision making (see chart for details).  Clinical Course     Patient presents with atraumatic right hip pain. She reports acute onset of pain. Pain is superior to the right hip. She has  normal range of motion but does have increased pain with range of motion. X-rays obtained and negative. CT obtained to rule out occult fracture. CT is negative as well. She denies back pain. She does have some foraminal narrowing of the lumbar spine. She is ambulatory. Will sent home with pain medication for symptom control.  After history, exam, and medical workup I feel the patient has been appropriately medically screened and is safe for discharge home. Pertinent diagnoses were discussed with the patient. Patient was given return precautions.   Final Clinical Impressions(s) / ED Diagnoses   Final diagnoses:  Right hip pain    New Prescriptions New Prescriptions   HYDROCODONE-ACETAMINOPHEN (NORCO/VICODIN) 5-325 MG TABLET    Take 1 tablet by mouth every 6 (six) hours as needed.  I personally performed the services described in this documentation, which was scribed in my presence. The recorded information has been reviewed and is accurate.     Shon Baton, MD 12/17/15 [phone]

## 2015-12-16 NOTE — ED Triage Notes (Signed)
Patient reports right posterior hip pain onset this evening , denies injury or fall , no urinary discomfort , no fever or chills ,. Pain increases with movement/palpation .

## 2015-12-16 NOTE — ED Notes (Signed)
Patient returned from X-ray 

## 2015-12-17 ENCOUNTER — Emergency Department (HOSPITAL_COMMUNITY): Payer: Medicare Other

## 2015-12-17 DIAGNOSIS — M25551 Pain in right hip: Secondary | ICD-10-CM | POA: Diagnosis not present

## 2015-12-17 MED ORDER — HYDROCODONE-ACETAMINOPHEN 5-325 MG PO TABS: 1.0000 | ORAL_TABLET | Freq: Four times a day (QID) | ORAL | 0 refills | Status: DC | PRN

## 2015-12-17 NOTE — ED Notes (Signed)
Pt returned from CT °

## 2015-12-17 NOTE — ED Notes (Addendum)
Pt ambulated in hall and had a slight limp. Pt stated she felt pain during ambulation however her pain level never increased. Pt was steady with a normal gait. Pt stated her pain is a 7 out of 10 after walking.

## 2015-12-17 NOTE — Discharge Instructions (Signed)
You were seen today for hip pain. Your x-rays and CT are negative.  He will be given pain medication at home. If symptoms worsen or he develop pain, numbness, tingling going down the leg you need to be reevaluated.

## 2015-12-17 NOTE — ED Notes (Signed)
Patient transported to CT 

## 2015-12-22 DIAGNOSIS — M545 Low back pain: Secondary | ICD-10-CM | POA: Diagnosis not present

## 2015-12-24 DIAGNOSIS — M545 Low back pain: Secondary | ICD-10-CM | POA: Diagnosis not present

## 2016-01-01 ENCOUNTER — Ambulatory Visit (INDEPENDENT_AMBULATORY_CARE_PROVIDER_SITE_OTHER): Payer: Medicare Other

## 2016-01-01 DIAGNOSIS — Z7901 Long term (current) use of anticoagulants: Secondary | ICD-10-CM

## 2016-01-01 DIAGNOSIS — Z5181 Encounter for therapeutic drug level monitoring: Secondary | ICD-10-CM

## 2016-01-01 DIAGNOSIS — I2699 Other pulmonary embolism without acute cor pulmonale: Secondary | ICD-10-CM

## 2016-01-01 LAB — POCT INR: INR: 2.7

## 2016-01-26 ENCOUNTER — Ambulatory Visit: Payer: Medicare Other | Admitting: Sports Medicine

## 2016-02-05 ENCOUNTER — Ambulatory Visit (INDEPENDENT_AMBULATORY_CARE_PROVIDER_SITE_OTHER): Payer: Medicare Other | Admitting: *Deleted

## 2016-02-05 DIAGNOSIS — Z7901 Long term (current) use of anticoagulants: Secondary | ICD-10-CM | POA: Diagnosis not present

## 2016-02-05 DIAGNOSIS — I2699 Other pulmonary embolism without acute cor pulmonale: Secondary | ICD-10-CM

## 2016-02-05 DIAGNOSIS — Z5181 Encounter for therapeutic drug level monitoring: Secondary | ICD-10-CM

## 2016-02-05 LAB — POCT INR: INR: 3.3

## 2016-02-09 ENCOUNTER — Telehealth: Payer: Self-pay | Admitting: Family Medicine

## 2016-02-09 ENCOUNTER — Ambulatory Visit (HOSPITAL_COMMUNITY)
Admission: EM | Admit: 2016-02-09 | Discharge: 2016-02-09 | Disposition: A | Discharge status: Home / Self Care | Payer: Medicare Other

## 2016-02-09 ENCOUNTER — Encounter (HOSPITAL_COMMUNITY): Payer: Self-pay | Admitting: Emergency Medicine

## 2016-02-09 ENCOUNTER — Emergency Department (HOSPITAL_COMMUNITY)
Admission: EM | Admit: 2016-02-09 | Discharge: 2016-02-10 | Disposition: A | Discharge status: Home / Self Care | Payer: Medicare Other | Attending: Emergency Medicine | Admitting: Emergency Medicine

## 2016-02-09 DIAGNOSIS — Z7901 Long term (current) use of anticoagulants: Secondary | ICD-10-CM | POA: Diagnosis not present

## 2016-02-09 DIAGNOSIS — Z87891 Personal history of nicotine dependence: Secondary | ICD-10-CM | POA: Diagnosis not present

## 2016-02-09 DIAGNOSIS — E1140 Type 2 diabetes mellitus with diabetic neuropathy, unspecified: Secondary | ICD-10-CM | POA: Insufficient documentation

## 2016-02-09 DIAGNOSIS — T148XXA Other injury of unspecified body region, initial encounter: Secondary | ICD-10-CM

## 2016-02-09 DIAGNOSIS — Z96659 Presence of unspecified artificial knee joint: Secondary | ICD-10-CM | POA: Insufficient documentation

## 2016-02-09 DIAGNOSIS — Z7984 Long term (current) use of oral hypoglycemic drugs: Secondary | ICD-10-CM | POA: Insufficient documentation

## 2016-02-09 DIAGNOSIS — Y999 Unspecified external cause status: Secondary | ICD-10-CM | POA: Insufficient documentation

## 2016-02-09 DIAGNOSIS — Z79899 Other long term (current) drug therapy: Secondary | ICD-10-CM | POA: Insufficient documentation

## 2016-02-09 DIAGNOSIS — Y939 Activity, unspecified: Secondary | ICD-10-CM | POA: Insufficient documentation

## 2016-02-09 DIAGNOSIS — Y929 Unspecified place or not applicable: Secondary | ICD-10-CM | POA: Diagnosis not present

## 2016-02-09 DIAGNOSIS — X58XXXA Exposure to other specified factors, initial encounter: Secondary | ICD-10-CM | POA: Insufficient documentation

## 2016-02-09 DIAGNOSIS — I10 Essential (primary) hypertension: Secondary | ICD-10-CM | POA: Insufficient documentation

## 2016-02-09 DIAGNOSIS — S5012XA Contusion of left forearm, initial encounter: Secondary | ICD-10-CM | POA: Diagnosis not present

## 2016-02-09 DIAGNOSIS — S40022A Contusion of left upper arm, initial encounter: Secondary | ICD-10-CM | POA: Diagnosis not present

## 2016-02-09 LAB — CBC WITH DIFFERENTIAL/PLATELET
BASOS PCT: 0 %
Basophils Absolute: 0 10*3/uL (ref 0.0–0.1)
Eosinophils Absolute: 0.2 10*3/uL (ref 0.0–0.7)
Eosinophils Relative: 1 %
HEMATOCRIT: 42.3 % (ref 36.0–46.0)
HEMOGLOBIN: 13.2 g/dL (ref 12.0–15.0)
LYMPHS PCT: 31 %
Lymphs Abs: 4.5 10*3/uL — ABNORMAL HIGH (ref 0.7–4.0)
MCH: 27.7 pg (ref 26.0–34.0)
MCHC: 31.2 g/dL (ref 30.0–36.0)
MCV: 88.7 fL (ref 78.0–100.0)
Monocytes Absolute: 1 10*3/uL (ref 0.1–1.0)
Monocytes Relative: 7 %
NEUTROS ABS: 8.9 10*3/uL — AB (ref 1.7–7.7)
NEUTROS PCT: 61 %
Platelets: 259 10*3/uL (ref 150–400)
RBC: 4.77 MIL/uL (ref 3.87–5.11)
RDW: 15.7 % — ABNORMAL HIGH (ref 11.5–15.5)
WBC: 14.6 10*3/uL — ABNORMAL HIGH (ref 4.0–10.5)

## 2016-02-09 LAB — PROTIME-INR
INR: 3.19
Prothrombin Time: 33.4 seconds — ABNORMAL HIGH (ref 11.4–15.2)

## 2016-02-09 NOTE — Telephone Encounter (Signed)
Patient Name: Hannah [name]  DOB: 04/13/1940    Initial Comment Caller States their mother has arm pain.   Nurse Assessment  Nurse: Annye English RN, Denise Date/Time (Eastern Time): 02/09/2016 4:50:01 PM  Confirm and document reason for call. If symptomatic, describe symptoms. ---Caller states their mother has arm left arm pain. States she was holding rail walking up steps and heard something in her arm. She now has a large bruise on the arm and pt dtr concerned due to pt hx of DVT.  Does the patient have any new or worsening symptoms? ---Yes  Will a triage be completed? ---Yes  Related visit to physician within the last 2 weeks? ---No  Does the PT have any chronic conditions? (i.e. diabetes, asthma, etc.) ---Yes  List chronic conditions. ---HTN, DVT, DM  Is this a behavioral health or substance abuse call? ---No     Guidelines    Guideline Title Affirmed Question Affirmed Notes  Arm Pain [1] SEVERE pain AND [2] not improved 2 hours after pain medicine    Final Disposition User   Go to ED Now (or PCP triage) Annye English, RN, Angelique Blonder    Comments  Advised to have another adult drive, take a list of all meds with her to the ER.   Referrals  [facility] - ED  [facility] - ED   Disagree/Comply: Comply

## 2016-02-09 NOTE — ED Triage Notes (Signed)
Pt here for left arm bruising without any obvious injury

## 2016-02-09 NOTE — ED Provider Notes (Signed)
[facility]-EMERGENCY DEPT Provider Note   CSN: [id number] Arrival date & time: 02/09/16  1735  By signing my name below, I, Arianna Nassar, attest that this documentation has been prepared under the direction and in the presence of Shon Baton, MD.  Electronically Signed: Octavia Heir, ED Scribe. 02/09/16. 11:40 PM.    History   Chief Complaint Chief Complaint  Patient presents with  . Bleeding/Bruising   The history is provided by the patient. No language interpreter was used.   HPI Comments: Hannah [name] is a 76 y.o. female who has a PMhx of acute massive PE, DM, HTN, HLD, and thyroid disease presents to the Emergency Department presenting with moderate, gradual worsening, left upper arm ecchymosis that began this morning. Pt reports that ~ 3 days ago, she heard her bones "crack" and had initial pain that comes back intermittently. She notes increased pain when raising her left arm.There has been no obvious injury to the area. She has not applied ice to the area nor has she taken any medication. Pt is currently on Coumadin for having a hx of DVT in both of her lungs. Pt denies any current pain, chest pain, shortness of breath, and fever.   Past Medical History:  Diagnosis Date  . Acute massive pulmonary embolism (HCC) 07/13/2012   Massive PE w/ PEA arrest 07/13/12 >TNK >IVC filter >discharged on comadin    . Diabetes mellitus, type 2 (HCC) 07/15/2012   with peripheral neuropathy  . Hyperlipemia 07/14/2012  . Hypertension 07/14/2012  . Large bowel stricture    s/p colectomy in 2016  . Osteoarthritis    s/p hip and knee replacements  . Thyroid disease     Patient Active Problem List   Diagnosis Date Noted  . Encounter for therapeutic drug monitoring 05/04/2015  . Pulmonary embolism (HCC) 02/12/2015  . Neuropathy (HCC) 02/12/2015  . Osteoarthritis 08/12/2014  . Normocytic anemia 06/08/2014  . Hypothyroid 06/06/2013  . Hypertension 06/06/2013  . Diabetes mellitus with  neurological manifestations, controlled (HCC) 06/06/2013  . Hyperlipemia 02/25/2013  . Long term current use of anticoagulant therapy 07/27/2012    Past Surgical History:  Procedure Laterality Date  . COLOSTOMY N/A 06/03/2014   Procedure: COLOSTOMY;  Surgeon: Harriette Bouillon, MD;  Location: [facility] OR;  Service: General;  Laterality: N/A;  . FLEXIBLE SIGMOIDOSCOPY N/A 05/30/2014   Procedure: Arnell Sieving;  Surgeon: Jeani Hawking, MD;  Location: [facility] ENDOSCOPY;  Service: Endoscopy;  Laterality: N/A;  . INSERTION OF VENA CAVA FILTER N/A 07/16/2012   Procedure: INSERTION OF VENA CAVA FILTER;  Surgeon: [name], MD;  Location[facility] CATH LAB;  Service: Cardiovascular;  Laterality: N/A;  . KNEE ARTHROSCOPY    . PARTIAL COLECTOMY N/A 06/03/2014   Procedure: PARTIAL COLECTOMY;  Surgeon: Harriette Bouillon, MD;  Location: [facility] OR;  Service: General;  Laterality: N/A;  . SP ARTHRO HIP*L*      OB History    No data available       Home Medications    Prior to Admission medications   Medication Sig Start Date End Date Taking? Authorizing Provider  acetaminophen (TYLENOL) 500 MG tablet Take 500 mg by mouth every 6 (six) hours as needed (pain).   Yes Historical Provider, MD  glucose blood (ONETOUCH VERIO) test strip Use as instructed to check blood sugar once a day 04/10/15  Yes [name], DO  HYDROcodone-acetaminophen (NORCO/VICODIN) 5-325 MG tablet Take 1 tablet by mouth every 6 (six) hours as needed. Patient taking differently: Take 1  tablet by mouth every 6 (six) hours as needed for moderate pain.  12/17/15  Yes Shon Baton, MD  levothyroxine (SYNTHROID, LEVOTHROID) 25 MCG tablet Take 1 tablet by mouth  daily before breakfast 04/10/15  Yes [name], DO  metFORMIN (GLUCOPHAGE) 1000 MG tablet 1000mg  (1 tablet) with breakfast and 1000mg  (1 tablet) with dinner. Patient taking differently: Take 1,000 mg by mouth 2 (two) times daily with a meal.  04/10/15  Yes [name], DO  metoprolol  tartrate (LOPRESSOR) 25 MG tablet Take 1 tablet (25 mg total) by mouth 2 (two) times daily. 04/10/15  Yes [name], DO  ONETOUCH DELICA LANCETS FINE MISC 1 each by Does not apply route daily. E11.9 04/10/15  Yes [name], DO  simvastatin (ZOCOR) 20 MG tablet Take 1 tablet (20 mg total) by mouth at bedtime. 04/10/15  Yes [name], DO  vitamin C (ASCORBIC ACID) 500 MG tablet Take 500 mg by mouth daily.    Yes Historical Provider, MD  warfarin (COUMADIN) 5 MG tablet Take as directed by [facility] Patient taking differently: Take 2.5-5 mg by mouth See admin instructions. Take 2.5 mg on Tuesday and Friday then take 1 tablet all the other days 11/16/15  Yes Kalman Shan, MD    Family History History reviewed. No pertinent family history.  Social History Social History  Substance Use Topics  . Smoking status: Former Smoker    Packs/day: 1.00    Years: 10.00    Types: Cigarettes    Quit date: 01/04/1968  . Smokeless tobacco: Never Used  . Alcohol use No     Allergies   Augmentin [amoxicillin-pot clavulanate] and Tranxene [clorazepate]   Review of Systems Review of Systems  Constitutional: Negative for fever.  Respiratory: Negative for shortness of breath.   Cardiovascular: Negative for chest pain.  Musculoskeletal: Positive for myalgias.  Skin: Positive for color change.  All other systems reviewed and are negative.    Physical Exam Updated Vital Signs BP 152/72   Pulse 78   Temp 98.3 F (36.8 C)   Resp 18   SpO2 97%   Physical Exam  Constitutional: She is oriented to person, place, and time. She appears well-developed and well-nourished.  elderly  HENT:  Head: Normocephalic and atraumatic.  Cardiovascular: Normal rate, regular rhythm and normal heart sounds.   No murmur heard. Pulmonary/Chest: Effort normal and breath sounds normal. No respiratory distress. She has no wheezes.  Abdominal: Soft. Bowel sounds are normal. There is no tenderness. There is no  guarding.  Musculoskeletal:  Large hematoma to the left arm, mild swelling, range of motion of the left shoulder and elbow, 2+ radial pulse, biceps tendon mechanism does appear intact; however, she does have a slight bulge  Neurological: She is alert and oriented to person, place, and time.  Skin: Skin is warm and dry.  Psychiatric: She has a normal mood and affect.  Nursing note and vitals reviewed.    ED Treatments / Results  DIAGNOSTIC STUDIES: Oxygen Saturation is 97% on RA, normal by my interpretation.  COORDINATION OF CARE:  11:39 PM Discussed treatment plan with pt at bedside and pt agreed to plan.  Labs (all labs ordered are listed, but only abnormal results are displayed) Labs Reviewed  PROTIME-INR - Abnormal; Notable for the following:       Result Value   Prothrombin Time 33.4 (*)    All other components within normal limits  CBC WITH DIFFERENTIAL/PLATELET - Abnormal;  Notable for the following:    WBC 14.6 (*)    RDW 15.7 (*)    Neutro Abs 8.9 (*)    Lymphs Abs 4.5 (*)    All other components within normal limits    EKG  EKG Interpretation None       Radiology Dg Humerus Left  Result Date: 02/10/2016 CLINICAL DATA:  Bruising anterior forearm EXAM: LEFT HUMERUS - 2+ VIEW COMPARISON:  None. FINDINGS: Degenerative changes at the left [facility] joint. No acute displaced fracture or malalignment. No radiopaque foreign body in the soft tissues. IMPRESSION: No acute osseous abnormality Electronically Signed   By: Jasmine Pang M.D.   On: 02/10/2016 00:28    Procedures Procedures (including critical care time)  Medications Ordered in ED Medications - No data to display   Initial Impression / Assessment and Plan / ED Course  I have reviewed the triage vital signs and the nursing notes.  Pertinent labs & imaging results that were available during my care of the patient were reviewed by me and considered in my medical decision making (see chart for details).      Patient presents with bruising to the left arm. Atraumatic. She has bruising and some swelling on exam. She is otherwise intact. Questionable partial biceps tendon rupture. This could explain the "pop."  X-rays negative. Ice and Ace wrap applied. She is therapeutic with her INR. However, will have patient return for left upper extremity ultrasound.  Recommend ice and compression and follow-up primary physician.  After history, exam, and medical workup I feel the patient has been appropriately medically screened and is safe for discharge home. Pertinent diagnoses were discussed with the patient. Patient was given return precautions.   Final Clinical Impressions(s) / ED Diagnoses   Final diagnoses:  Hematoma    I personally performed the services described in this documentation, which was scribed in my presence. The recorded information has been reviewed and is accurate.  New Prescriptions New Prescriptions   No medications on file     Shon Baton, MD 02/10/16 [phone]

## 2016-02-10 ENCOUNTER — Ambulatory Visit (HOSPITAL_BASED_OUTPATIENT_CLINIC_OR_DEPARTMENT_OTHER)
Admission: RE | Admit: 2016-02-10 | Discharge: 2016-02-10 | Disposition: A | Discharge status: Home / Self Care | Payer: Medicare Other | Source: Ambulatory Visit | Attending: Emergency Medicine | Admitting: Emergency Medicine

## 2016-02-10 ENCOUNTER — Encounter (HOSPITAL_COMMUNITY): Payer: Self-pay

## 2016-02-10 ENCOUNTER — Ambulatory Visit (HOSPITAL_COMMUNITY)
Admission: RE | Admit: 2016-02-10 | Discharge: 2016-02-10 | Disposition: A | Discharge status: Home / Self Care | Payer: Medicare Other | Source: Ambulatory Visit | Attending: Emergency Medicine | Admitting: Emergency Medicine

## 2016-02-10 ENCOUNTER — Emergency Department (HOSPITAL_COMMUNITY): Payer: Medicare Other

## 2016-02-10 DIAGNOSIS — M7989 Other specified soft tissue disorders: Secondary | ICD-10-CM

## 2016-02-10 DIAGNOSIS — S5012XA Contusion of left forearm, initial encounter: Secondary | ICD-10-CM | POA: Diagnosis not present

## 2016-02-10 DIAGNOSIS — M79609 Pain in unspecified limb: Secondary | ICD-10-CM

## 2016-02-10 NOTE — Progress Notes (Signed)
VASCULAR LAB PRELIMINARY  PRELIMINARY  PRELIMINARY  PRELIMINARY  Left upper extremity venous duplex completed.    Preliminary report:  Left :  No evidence of DVT or superficial thrombosis.    Sailor Haughn, RVS 02/10/2016, 10:39 AM

## 2016-02-10 NOTE — Discharge Instructions (Signed)
You were seen today for bruising and swelling of the left upper arm. This is likely a large hematoma that is worsened because you are on Coumadin. You could also have a partial biceps tendon rupture. Follow-up for ultrasound later today to evaluate vascular supply. Use ice and a compressive bandage. Follow-up with her primary physician.

## 2016-02-10 NOTE — ED Notes (Signed)
Patient transported to X-ray 

## 2016-02-10 NOTE — Telephone Encounter (Signed)
Pt seen and evaluated in ED.  

## 2016-02-18 ENCOUNTER — Telehealth: Payer: Self-pay | Admitting: *Deleted

## 2016-02-18 NOTE — Telephone Encounter (Signed)
Pt called & she stated she needed a refill on her Coumadin. There was a refill sent in November 2017 with a 3 month supply with 1 refill. Called Pharmacy & they stated they never received it. Therefore, called Pharmacy & gave a verbal order to Pharmacist Jean Rosenthal for 90 tabs with 1 refill. Returned cal to pt & she is aware.

## 2016-03-10 ENCOUNTER — Inpatient Hospital Stay (HOSPITAL_COMMUNITY)
Admission: EM | Admit: 2016-03-10 | Discharge: 2016-03-14 | DRG: 378 | Disposition: A | Discharge status: Home / Self Care | Payer: Medicare Other | Attending: Internal Medicine | Admitting: Internal Medicine

## 2016-03-10 ENCOUNTER — Encounter (HOSPITAL_COMMUNITY): Payer: Self-pay

## 2016-03-10 DIAGNOSIS — Z881 Allergy status to other antibiotic agents status: Secondary | ICD-10-CM

## 2016-03-10 DIAGNOSIS — Z96642 Presence of left artificial hip joint: Secondary | ICD-10-CM | POA: Diagnosis not present

## 2016-03-10 DIAGNOSIS — Z888 Allergy status to other drugs, medicaments and biological substances status: Secondary | ICD-10-CM

## 2016-03-10 DIAGNOSIS — E785 Hyperlipidemia, unspecified: Secondary | ICD-10-CM | POA: Diagnosis present

## 2016-03-10 DIAGNOSIS — K921 Melena: Secondary | ICD-10-CM | POA: Diagnosis present

## 2016-03-10 DIAGNOSIS — E784 Other hyperlipidemia: Secondary | ICD-10-CM

## 2016-03-10 DIAGNOSIS — Z79891 Long term (current) use of opiate analgesic: Secondary | ICD-10-CM | POA: Diagnosis not present

## 2016-03-10 DIAGNOSIS — Z86711 Personal history of pulmonary embolism: Secondary | ICD-10-CM

## 2016-03-10 DIAGNOSIS — I160 Hypertensive urgency: Secondary | ICD-10-CM | POA: Diagnosis present

## 2016-03-10 DIAGNOSIS — E038 Other specified hypothyroidism: Secondary | ICD-10-CM | POA: Diagnosis not present

## 2016-03-10 DIAGNOSIS — E118 Type 2 diabetes mellitus with unspecified complications: Secondary | ICD-10-CM | POA: Diagnosis not present

## 2016-03-10 DIAGNOSIS — E1140 Type 2 diabetes mellitus with diabetic neuropathy, unspecified: Secondary | ICD-10-CM | POA: Diagnosis not present

## 2016-03-10 DIAGNOSIS — Z79899 Other long term (current) drug therapy: Secondary | ICD-10-CM | POA: Diagnosis not present

## 2016-03-10 DIAGNOSIS — Z7901 Long term (current) use of anticoagulants: Secondary | ICD-10-CM | POA: Diagnosis not present

## 2016-03-10 DIAGNOSIS — K5731 Diverticulosis of large intestine without perforation or abscess with bleeding: Principal | ICD-10-CM | POA: Diagnosis present

## 2016-03-10 DIAGNOSIS — I10 Essential (primary) hypertension: Secondary | ICD-10-CM | POA: Diagnosis not present

## 2016-03-10 DIAGNOSIS — Z86718 Personal history of other venous thrombosis and embolism: Secondary | ICD-10-CM

## 2016-03-10 DIAGNOSIS — E039 Hypothyroidism, unspecified: Secondary | ICD-10-CM | POA: Diagnosis present

## 2016-03-10 DIAGNOSIS — E1149 Type 2 diabetes mellitus with other diabetic neurological complication: Secondary | ICD-10-CM | POA: Diagnosis present

## 2016-03-10 DIAGNOSIS — Z87891 Personal history of nicotine dependence: Secondary | ICD-10-CM | POA: Diagnosis not present

## 2016-03-10 DIAGNOSIS — D62 Acute posthemorrhagic anemia: Secondary | ICD-10-CM

## 2016-03-10 DIAGNOSIS — Z933 Colostomy status: Secondary | ICD-10-CM | POA: Diagnosis not present

## 2016-03-10 DIAGNOSIS — K922 Gastrointestinal hemorrhage, unspecified: Secondary | ICD-10-CM

## 2016-03-10 DIAGNOSIS — Z96659 Presence of unspecified artificial knee joint: Secondary | ICD-10-CM | POA: Diagnosis present

## 2016-03-10 DIAGNOSIS — Z7984 Long term (current) use of oral hypoglycemic drugs: Secondary | ICD-10-CM

## 2016-03-10 DIAGNOSIS — Z9049 Acquired absence of other specified parts of digestive tract: Secondary | ICD-10-CM

## 2016-03-10 LAB — COMPREHENSIVE METABOLIC PANEL
ALT: 12 U/L — ABNORMAL LOW (ref 14–54)
AST: 16 U/L (ref 15–41)
Albumin: 2.8 g/dL — ABNORMAL LOW (ref 3.5–5.0)
Alkaline Phosphatase: 44 U/L (ref 38–126)
Anion gap: 9 (ref 5–15)
BUN: 15 mg/dL (ref 6–20)
CHLORIDE: 107 mmol/L (ref 101–111)
CO2: 26 mmol/L (ref 22–32)
Calcium: 8.9 mg/dL (ref 8.9–10.3)
Creatinine, Ser: 1.05 mg/dL — ABNORMAL HIGH (ref 0.44–1.00)
GFR, EST AFRICAN AMERICAN: 59 mL/min — AB (ref >60)
GFR, EST NON AFRICAN AMERICAN: 51 mL/min — AB (ref >60)
Glucose, Bld: 107 mg/dL — ABNORMAL HIGH (ref 65–99)
Potassium: 3.9 mmol/L (ref 3.5–5.1)
Sodium: 142 mmol/L (ref 135–145)
TOTAL PROTEIN: 5.4 g/dL — AB (ref 6.5–8.1)
Total Bilirubin: 0.5 mg/dL (ref 0.3–1.2)

## 2016-03-10 LAB — CBC WITH DIFFERENTIAL/PLATELET
BASOS ABS: 0 10*3/uL (ref 0.0–0.1)
Basophils Relative: 0 %
Eosinophils Absolute: 0.2 10*3/uL (ref 0.0–0.7)
Eosinophils Relative: 2 %
HEMATOCRIT: 35.3 % — AB (ref 36.0–46.0)
HEMOGLOBIN: 10.9 g/dL — AB (ref 12.0–15.0)
LYMPHS ABS: 3.5 10*3/uL (ref 0.7–4.0)
Lymphocytes Relative: 33 %
MCH: 27.6 pg (ref 26.0–34.0)
MCHC: 30.9 g/dL (ref 30.0–36.0)
MCV: 89.4 fL (ref 78.0–100.0)
Monocytes Absolute: 0.6 10*3/uL (ref 0.1–1.0)
Monocytes Relative: 5 %
NEUTROS ABS: 6.4 10*3/uL (ref 1.7–7.7)
Neutrophils Relative %: 60 %
Platelets: 231 10*3/uL (ref 150–400)
RBC: 3.95 MIL/uL (ref 3.87–5.11)
RDW: 16.3 % — ABNORMAL HIGH (ref 11.5–15.5)
WBC: 10.7 10*3/uL — AB (ref 4.0–10.5)

## 2016-03-10 LAB — CBC
HCT: 30.5 % — ABNORMAL LOW (ref 36.0–46.0)
HEMOGLOBIN: 9.4 g/dL — AB (ref 12.0–15.0)
MCH: 27.3 pg (ref 26.0–34.0)
MCHC: 30.8 g/dL (ref 30.0–36.0)
MCV: 88.7 fL (ref 78.0–100.0)
PLATELETS: 221 10*3/uL (ref 150–400)
RBC: 3.44 MIL/uL — ABNORMAL LOW (ref 3.87–5.11)
RDW: 16 % — ABNORMAL HIGH (ref 11.5–15.5)
WBC: 9.9 10*3/uL (ref 4.0–10.5)

## 2016-03-10 LAB — TYPE AND SCREEN
ABO/RH(D): O POS
ANTIBODY SCREEN: NEGATIVE

## 2016-03-10 LAB — PROTIME-INR
INR: 2.86
Prothrombin Time: 30.6 seconds — ABNORMAL HIGH (ref 11.4–15.2)

## 2016-03-10 LAB — GLUCOSE, CAPILLARY
Glucose-Capillary: 116 mg/dL — ABNORMAL HIGH (ref 65–99)
Glucose-Capillary: 126 mg/dL — ABNORMAL HIGH (ref 65–99)

## 2016-03-10 LAB — POC OCCULT BLOOD, ED: Fecal Occult Bld: POSITIVE — AB

## 2016-03-10 MED ORDER — METOPROLOL TARTRATE 25 MG PO TABS
25.0000 mg | ORAL_TABLET | Freq: Two times a day (BID) | ORAL | Status: DC
  Administered 2016-03-10 – 2016-03-14 (×7): 25 mg via ORAL
  Filled 2016-03-10 (×7): qty 1

## 2016-03-10 MED ORDER — HYDROCODONE-ACETAMINOPHEN 5-325 MG PO TABS: 1.0000 | ORAL_TABLET | Freq: Four times a day (QID) | ORAL | Status: DC | PRN

## 2016-03-10 MED ORDER — ONDANSETRON HCL 4 MG/2ML IJ SOLN: 4.0000 mg | Freq: Four times a day (QID) | INTRAMUSCULAR | Status: DC | PRN

## 2016-03-10 MED ORDER — SIMVASTATIN 20 MG PO TABS
20.0000 mg | ORAL_TABLET | Freq: Every day | ORAL | Status: DC
  Administered 2016-03-10 – 2016-03-13 (×4): 20 mg via ORAL
  Filled 2016-03-10 (×4): qty 1

## 2016-03-10 MED ORDER — ACETAMINOPHEN 650 MG RE SUPP: 650.0000 mg | Freq: Four times a day (QID) | RECTAL | Status: DC | PRN

## 2016-03-10 MED ORDER — LEVOTHYROXINE SODIUM 25 MCG PO TABS
25.0000 ug | ORAL_TABLET | Freq: Every day | ORAL | Status: DC
  Administered 2016-03-11 – 2016-03-14 (×3): 25 ug via ORAL
  Filled 2016-03-10 (×3): qty 1

## 2016-03-10 MED ORDER — ONDANSETRON HCL 4 MG PO TABS: 4.0000 mg | ORAL_TABLET | Freq: Four times a day (QID) | ORAL | Status: DC | PRN

## 2016-03-10 MED ORDER — INSULIN ASPART 100 UNIT/ML ~~LOC~~ SOLN
0.0000 [IU] | Freq: Every day | SUBCUTANEOUS | Status: DC
Start: 2016-03-10 — End: 2016-03-14

## 2016-03-10 MED ORDER — VITAMIN K1 10 MG/ML IJ SOLN
5.0000 mg | Freq: Once | INTRAVENOUS | Status: AC
  Administered 2016-03-10: 5 mg via INTRAVENOUS
  Filled 2016-03-10 (×2): qty 0.5

## 2016-03-10 MED ORDER — ACETAMINOPHEN 325 MG PO TABS
650.0000 mg | ORAL_TABLET | Freq: Four times a day (QID) | ORAL | Status: DC | PRN
  Administered 2016-03-10 – 2016-03-13 (×2): 650 mg via ORAL
  Filled 2016-03-10 (×2): qty 2

## 2016-03-10 MED ORDER — INSULIN ASPART 100 UNIT/ML ~~LOC~~ SOLN
0.0000 [IU] | Freq: Three times a day (TID) | SUBCUTANEOUS | Status: DC
  Administered 2016-03-12: 2 [IU] via SUBCUTANEOUS
  Administered 2016-03-13: 1 [IU] via SUBCUTANEOUS

## 2016-03-10 NOTE — ED Triage Notes (Signed)
Pt coming in for evaluation of blood noted in her ostomy bag pt is on coumadin

## 2016-03-10 NOTE — ED Notes (Signed)
ED Provider at bedside. 

## 2016-03-10 NOTE — H&P (Signed)
History and Physical    Hannah [name] JYN:[id number] DOB: 08/11/1940 DOA: 03/10/2016  PCP: [name]., DO Patient coming from: home  Chief Complaint: bright red blood per stoma  HPI: Hannah [name] is a 76 y.o. female with medical history significant of DVTs, diabetes, hyperlipidemia, hypertension, small bowel stricture status post colectomy with permanent colostomy, hypothyroidism. Patient presenting with painless bright red blood per stoma. Ongoing for 1 day now. No better or worse since onset. Appears to be a constant outflow of blood. Denies any generalized weakness, lightheadedness, shortness of breath, palpitations, chest pain, nausea, vomiting, abdominal pain, loose stools per stoma, fevers. Patient reports difficulty with Coumadin dosing endorsing labile INRs. Patient had multiple recent changes to her Coumadin dosing.  ED Course: Objective finding outlined below. EDP contacted GI  Review of Systems: As per HPI otherwise 10 point review of systems negative.   Ambulatory Status: no restrictions  Past Medical History:  Diagnosis Date  . Acute massive pulmonary embolism (HCC) 07/13/2012   Massive PE w/ PEA arrest 07/13/12 >TNK >IVC filter >discharged on comadin    . Diabetes mellitus, type 2 (HCC) 07/15/2012   with peripheral neuropathy  . Hyperlipemia 07/14/2012  . Hypertension 07/14/2012  . Large bowel stricture    s/p colectomy in 2016  . Osteoarthritis    s/p hip and knee replacements  . Thyroid disease     Past Surgical History:  Procedure Laterality Date  . COLOSTOMY N/A 06/03/2014   Procedure: COLOSTOMY;  Surgeon: Harriette Bouillon, MD;  Location: [facility] OR;  Service: General;  Laterality: N/A;  . FLEXIBLE SIGMOIDOSCOPY N/A 05/30/2014   Procedure: Arnell Sieving;  Surgeon: Jeani Hawking, MD;  Location: [facility] ENDOSCOPY;  Service: Endoscopy;  Laterality: N/A;  . INSERTION OF VENA CAVA FILTER N/A 07/16/2012   Procedure: INSERTION OF VENA CAVA FILTER;  Surgeon: [name],  MD;  Location: [facility] CATH LAB;  Service: Cardiovascular;  Laterality: N/A;  . KNEE ARTHROSCOPY    . PARTIAL COLECTOMY N/A 06/03/2014   Procedure: PARTIAL COLECTOMY;  Surgeon: Harriette Bouillon, MD;  Location: [facility] OR;  Service: General;  Laterality: N/A;  . SP ARTHRO HIP*L*      Social History   Social History  . Marital status: Widowed    Spouse name: N/A  . Number of children: N/A  . Years of education: N/A   Occupational History  . Not on file.   Social History Main Topics  . Smoking status: Former Smoker    Packs/day: 1.00    Years: 10.00    Types: Cigarettes    Quit date: 01/04/1968  . Smokeless tobacco: Never Used  . Alcohol use No  . Drug use: No  . Sexual activity: Not on file   Other Topics Concern  . Not on file   Social History Narrative   Lives alone.           Allergies  Allergen Reactions  . Augmentin [Amoxicillin-Pot Clavulanate] Other (See Comments)    Severe vaginal itching  . Tranxene [Clorazepate] Itching    No family history on file.  Prior to Admission medications   Medication Sig Start Date End Date Taking? Authorizing Provider  acetaminophen (TYLENOL) 500 MG tablet Take 500 mg by mouth every 6 (six) hours as needed (pain).   Yes Historical Provider, MD  HYDROcodone-acetaminophen (NORCO/VICODIN) 5-325 MG tablet Take 1 tablet by mouth every 6 (six) hours as needed. Patient taking differently: Take 1 tablet by mouth every 6 (six) hours as needed for moderate pain.  12/17/15  Yes Shon Baton, MD  levothyroxine (SYNTHROID, LEVOTHROID) 25 MCG tablet Take 1 tablet by mouth  daily before breakfast 04/10/15  Yes [name], DO  metFORMIN (GLUCOPHAGE) 1000 MG tablet 1000mg  (1 tablet) with breakfast and 1000mg  (1 tablet) with dinner. Patient taking differently: Take 1,000 mg by mouth 2 (two) times daily with a meal.  04/10/15  Yes [name], DO  metoprolol tartrate (LOPRESSOR) 25 MG tablet Take 1 tablet (25 mg total) by mouth 2 (two) times daily. 04/10/15   Yes [name], DO  simvastatin (ZOCOR) 20 MG tablet Take 1 tablet (20 mg total) by mouth at bedtime. 04/10/15  Yes [name], DO  vitamin C (ASCORBIC ACID) 500 MG tablet Take 500 mg by mouth daily.    Yes Historical Provider, MD  warfarin (COUMADIN) 5 MG tablet Take as directed by Coumadin Clinic Patient taking differently: Take 2.5-5 mg by mouth See admin instructions. Take 2.5 mg on Tuesday and Friday ;  Take 5 mg on all other days 11/16/15  Yes Kalman Shan, MD    Physical Exam: Vitals:   03/10/16 0815 03/10/16 0816 03/10/16 1032 03/10/16 1338  BP:  170/75 138/64 166/94  Pulse:  73 93 88  Resp:  18 22 18   Temp:  97.9 F (36.6 C)    TempSrc:  Oral    SpO2:  100% 100% 100%  Weight: 75.8 kg (167 lb)     Height: 5\' 7"  (1.702 m)        General:  Appears calm and comfortable Eyes:  PERRL, EOMI, normal lids, iris ENT:  grossly normal hearing, lips & tongue, mmm Neck:  no LAD, masses or thyromegaly Cardiovascular:  RRR, III/VI systolic murmur. No LE edema.  Respiratory:  CTA bilaterally, no w/r/r. Normal respiratory effort. Abdomen: Stoma w/ 300cc frank blood in bag, soft, ntnd, NABS Skin:  no rash or induration seen on limited exam Musculoskeletal:  grossly normal tone BUE/BLE, good ROM, no bony abnormality Psychiatric:  grossly normal mood and affect, speech fluent and appropriate, AOx3 Neurologic:  CN 2-12 grossly intact, moves all extremities in coordinated fashion, sensation intact  Labs on Admission: I have personally reviewed following labs and imaging studies  CBC:  Recent Labs Lab 03/10/16 0826  WBC 10.7*  NEUTROABS 6.4  HGB 10.9*  HCT 35.3*  MCV 89.4  PLT 231   Basic Metabolic Panel:  Recent Labs Lab 03/10/16 0826  NA 142  K 3.9  CL 107  CO2 26  GLUCOSE 107*  BUN 15  CREATININE 1.05*  CALCIUM 8.9   GFR: Estimated Creatinine Clearance: 49.9 mL/min (by C-G formula based on SCr of 1.05 mg/dL (H)). Liver Function Tests:  Recent Labs Lab  03/10/16 0826  AST 16  ALT 12*  ALKPHOS 44  BILITOT 0.5  PROT 5.4*  ALBUMIN 2.8*   No results for input(s): LIPASE, AMYLASE in the last 168 hours. No results for input(s): AMMONIA in the last 168 hours. Coagulation Profile:  Recent Labs Lab 03/10/16 0826  INR 2.86   Cardiac Enzymes: No results for input(s): CKTOTAL, CKMB, CKMBINDEX, TROPONINI in the last 168 hours. BNP (last 3 results) No results for input(s): PROBNP in the last 8760 hours. HbA1C: No results for input(s): HGBA1C in the last 72 hours. CBG: No results for input(s): GLUCAP in the last 168 hours. Lipid Profile: No results for input(s): CHOL, HDL, LDLCALC, TRIG, CHOLHDL, LDLDIRECT in the last 72 hours. Thyroid Function Tests: No results for input(s):  TSH, T4TOTAL, FREET4, T3FREE, THYROIDAB in the last 72 hours. Anemia Panel: No results for input(s): VITAMINB12, FOLATE, FERRITIN, TIBC, IRON, RETICCTPCT in the last 72 hours. Urine analysis:    Component Value Date/Time   COLORURINE YELLOW 12/16/2015 2114   APPEARANCEUR CLOUDY (A) 12/16/2015 2114   LABSPEC 1.024 12/16/2015 2114   PHURINE 5.0 12/16/2015 2114   GLUCOSEU NEGATIVE 12/16/2015 2114   HGBUR NEGATIVE 12/16/2015 2114   BILIRUBINUR NEGATIVE 12/16/2015 2114   KETONESUR NEGATIVE 12/16/2015 2114   PROTEINUR 100 (A) 12/16/2015 2114   UROBILINOGEN 1.0 06/06/2014 0945   NITRITE NEGATIVE 12/16/2015 2114   LEUKOCYTESUR TRACE (A) 12/16/2015 2114    Creatinine Clearance: Estimated Creatinine Clearance: 49.9 mL/min (by C-G formula based on SCr of 1.05 mg/dL (H)).  Sepsis Labs: @LABRCNTIP (procalcitonin:4,lacticidven:4) )No results found for this or any previous visit (from the past 240 hour(s)).   Radiological Exams on Admission: No results found.  EKG: Independently reviewed. Pending   Assessment/Plan Active Problems:   Hyperlipemia   Hypothyroid   Hypertension   Diabetes mellitus with neurological manifestations, controlled (HCC)   GI bleed    History of DVT in adulthood   Acute blood loss anemia   GI bleed: frank BRBP stoma. Ongoing even after eval in ED. 3g/dL drop in Hgb. H/o bowel stricture s/p partial colectomy in 2006. Suspect this is from supratherapeutic INR.  - Vit K for warfarin reversal - Hold coumadin - CBC Q12 - GI consult per EDP - clear liquid, NPO after midnight  Acute blood loss anemia: likely from GI source from INR 2.86.  - CBC Q12 - transfuse if symptomatic and ~8.  DVT/PE: h/o multiple large PEs. Educated on various anticoagulation options. Family endorse any frustration with labile INRs and frequency of INR checks over the last several years. Family amenable to using a NOAC. Pt and daughter interested in switching to Eliquis. Will need to address changing to Eliquis in am prior to DC so that family has a chance to consider their options - DC on Eliquis for chronic DVT prophylaxis and stop coumadin - INR in am  Hypothyroidism: - continue synthroid  DM: - SSI  HTN: - continue metop  HLD: - continue statin    DVT prophylaxis: SCD  Code Status: full  Family Communication: daugther  Disposition Plan: pending resolution of GI bleed  Consults called: GI  Admission status: observation    MERRELL, DAVID J MD [facility]  If 7PM-7AM, please contact night-coverage [email or web address] Password [email or web address]  03/10/2016, 1:45 PM

## 2016-03-10 NOTE — ED Provider Notes (Signed)
Emergency Department Provider Note   I have reviewed the triage vital signs and the nursing notes.   HISTORY  Chief Complaint Stool Color Change (pt has an ostomy bag with blood noted in the bag)   HPI Hannah [name] is a 76 y.o. female with PMH of PE on Coumadin, HLD, HTN, large bowel stricture s/p colectomy 2016, and OA presents to the emergency department for evaluation of blood in her ostomy bag. The patient noticed bright red blood that again abruptly. Denies associated abdominal pain. She describes her ostomy bag feeling mostly of blood as opposed to blood mixed with stool. She denies any associated difficulty breathing, lightheadedness, or generalized weakness. She has been compliant with her Coumadin which she is on after large pulmonary embolism in 2014. She states her INR has been up and down and they are constantly adjusting the dose of her Coumadin. She denies any fever, chills, chest pain, dyspnea, or vomiting. No history of GI bleed in the past.   Past Medical History:  Diagnosis Date  . Acute massive pulmonary embolism (HCC) 07/13/2012   Massive PE w/ PEA arrest 07/13/12 >TNK >IVC filter >discharged on comadin    . Diabetes mellitus, type 2 (HCC) 07/15/2012   with peripheral neuropathy  . Hyperlipemia 07/14/2012  . Hypertension 07/14/2012  . Large bowel stricture    s/p colectomy in 2016  . Osteoarthritis    s/p hip and knee replacements  . Thyroid disease     Patient Active Problem List   Diagnosis Date Noted  . GI bleed 03/10/2016  . History of DVT in adulthood 03/10/2016  . Acute blood loss anemia 03/10/2016  . Gastrointestinal hemorrhage   . Diabetes mellitus with complication (HCC)   . Encounter for therapeutic drug monitoring 05/04/2015  . Pulmonary embolism (HCC) 02/12/2015  . Neuropathy (HCC) 02/12/2015  . Osteoarthritis 08/12/2014  . Normocytic anemia 06/08/2014  . Hypothyroid 06/06/2013  . Hypertension 06/06/2013  . Diabetes mellitus with neurological  manifestations, controlled (HCC) 06/06/2013  . Hyperlipemia 02/25/2013  . Armondo Cech term current use of anticoagulant therapy 07/27/2012    Past Surgical History:  Procedure Laterality Date  . COLOSTOMY N/A 06/03/2014   Procedure: COLOSTOMY;  Surgeon: Harriette Bouillon, MD;  Location: [facility] OR;  Service: General;  Laterality: N/A;  . FLEXIBLE SIGMOIDOSCOPY N/A 05/30/2014   Procedure: Arnell Sieving;  Surgeon: Jeani Hawking, MD;  Location: [facility] ENDOSCOPY;  Service: Endoscopy;  Laterality: N/A;  . INSERTION OF VENA CAVA FILTER N/A 07/16/2012   Procedure: INSERTION OF VENA CAVA FILTER;  Surgeon: [name], MD;  Location: [facility] CATH LAB;  Service: Cardiovascular;  Laterality: N/A;  . KNEE ARTHROSCOPY    . PARTIAL COLECTOMY N/A 06/03/2014   Procedure: PARTIAL COLECTOMY;  Surgeon: Harriette Bouillon, MD;  Location: [facility] OR;  Service: General;  Laterality: N/A;  . SP ARTHRO HIP*L*        Allergies Augmentin [amoxicillin-pot clavulanate] and Tranxene [clorazepate]  History reviewed. No pertinent family history.  Social History Social History  Substance Use Topics  . Smoking status: Former Smoker    Packs/day: 1.00    Years: 10.00    Types: Cigarettes    Quit date: 01/04/1968  . Smokeless tobacco: Never Used  . Alcohol use No    Review of Systems  Constitutional: No fever/chills Eyes: No visual changes. ENT: No sore throat. Cardiovascular: Denies chest pain. Respiratory: Denies shortness of breath. Gastrointestinal: No abdominal pain.  No nausea, no vomiting.  No diarrhea.  Positive blood in ostomy  bag. No constipation. Genitourinary: Negative for dysuria. Musculoskeletal: Negative for back pain. Skin: Negative for rash. Neurological: Negative for headaches, focal weakness or numbness.  10-point ROS otherwise negative.  ____________________________________________   PHYSICAL EXAM:  VITAL SIGNS: ED Triage Vitals  Enc Vitals Group     BP 03/10/16 0816 170/75     Pulse Rate  03/10/16 0816 73     Resp 03/10/16 0816 18     Temp 03/10/16 0816 97.9 F (36.6 C)     Temp Source 03/10/16 0816 Oral     SpO2 03/10/16 0816 100 %     Weight 03/10/16 0815 167 lb (75.8 kg)     Height 03/10/16 0815 5\' 7"  (1.702 m)   Constitutional: Alert and oriented. Well appearing and in no acute distress. Eyes: Conjunctivae are normal.  Head: Atraumatic. Nose: No congestion/rhinnorhea. Mouth/Throat: Mucous membranes are moist.  Neck: No stridor.   Cardiovascular: Normal rate, regular rhythm. Good peripheral circulation. Grossly normal heart sounds.   Respiratory: Normal respiratory effort.  No retractions. Lungs CTAB. Gastrointestinal: Soft and nontender. Ostomy in LLQ with small volume BRB. No distention.  Musculoskeletal: No lower extremity tenderness nor edema. No gross deformities of extremities. Neurologic:  Normal speech and language. No gross focal neurologic deficits are appreciated.  Skin:  Skin is warm, dry and intact. No rash noted. Psychiatric: Mood and affect are normal. Speech and behavior are normal.  ____________________________________________   LABS (all labs ordered are listed, but only abnormal results are displayed)  Labs Reviewed  COMPREHENSIVE METABOLIC PANEL - Abnormal; Notable for the following:       Result Value   Glucose, Bld 107 (*)    Creatinine, Ser 1.05 (*)    Total Protein 5.4 (*)    Albumin 2.8 (*)    ALT 12 (*)    GFR calc non Af Amer 51 (*)    GFR calc Af Amer 59 (*)    All other components within normal limits  CBC WITH DIFFERENTIAL/PLATELET - Abnormal; Notable for the following:    WBC 10.7 (*)    Hemoglobin 10.9 (*)    HCT 35.3 (*)    RDW 16.3 (*)    All other components within normal limits  PROTIME-INR - Abnormal; Notable for the following:    Prothrombin Time 30.6 (*)    All other components within normal limits  CBC - Abnormal; Notable for the following:    RBC 3.44 (*)    Hemoglobin 9.4 (*)    HCT 30.5 (*)    RDW 16.0  (*)    All other components within normal limits  GLUCOSE, CAPILLARY - Abnormal; Notable for the following:    Glucose-Capillary 116 (*)    All other components within normal limits  GLUCOSE, CAPILLARY - Abnormal; Notable for the following:    Glucose-Capillary 126 (*)    All other components within normal limits  GLUCOSE, CAPILLARY - Abnormal; Notable for the following:    Glucose-Capillary 110 (*)    All other components within normal limits  POC OCCULT BLOOD, ED - Abnormal; Notable for the following:    Fecal Occult Bld POSITIVE (*)    All other components within normal limits  BASIC METABOLIC PANEL  PROTIME-INR  CBC  TYPE AND SCREEN   ____________________________________________  RADIOLOGY  None ____________________________________________   PROCEDURES  Procedure(s) performed:   Procedures  None ____________________________________________   INITIAL IMPRESSION / ASSESSMENT AND PLAN / ED COURSE  Pertinent labs & imaging results that were available during  my care of the patient were reviewed by me and considered in my medical decision making (see chart for details).  Patient resents to the emergency room in for evaluation of bleeding in her ostomy bag. She is on Coumadin for large pulmonary embolism in 2014. She has appears to be blood in her ostomy and is also brought a sample from home. Abdomen is soft nontender. Ostomy is well appearing. No hypoxemia, tachycardia, other abnormal vital signs. Plan for labs including INR and type and screen. No indication for CT at this time. No dyspnea or other ACS symptoms.   12:10 PM Discussed patient's case with hospitalist, Dr. Konrad Dolores. Patient and family (if present) updated with plan. Care transferred to hospitalist service. No vitamin K at this time.   I reviewed all nursing notes, vitals, pertinent old records, EKGs, labs, imaging (as available).  ____________________________________________  FINAL CLINICAL IMPRESSION(S)  / ED DIAGNOSES  Final diagnoses:  Gastrointestinal hemorrhage, unspecified gastrointestinal hemorrhage type     MEDICATIONS GIVEN DURING THIS VISIT:  Medications  HYDROcodone-acetaminophen (NORCO/VICODIN) 5-325 MG per tablet 1 tablet (not administered)  levothyroxine (SYNTHROID, LEVOTHROID) tablet 25 mcg (25 mcg Oral Given 03/11/16 0753)  simvastatin (ZOCOR) tablet 20 mg (20 mg Oral Given 03/10/16 2215)  metoprolol tartrate (LOPRESSOR) tablet 25 mg (25 mg Oral Given 03/10/16 2215)  acetaminophen (TYLENOL) tablet 650 mg (650 mg Oral Given 03/10/16 1700)    Or  acetaminophen (TYLENOL) suppository 650 mg ( Rectal See Alternative 03/10/16 1700)  ondansetron (ZOFRAN) tablet 4 mg (not administered)    Or  ondansetron (ZOFRAN) injection 4 mg (not administered)  insulin aspart (novoLOG) injection 0-9 Units (0 Units Subcutaneous Not Given 03/11/16 0753)  insulin aspart (novoLOG) injection 0-5 Units (0 Units Subcutaneous Not Given 03/10/16 2216)  phytonadione (VITAMIN K) 5 mg in dextrose 5 % 50 mL IVPB (5 mg Intravenous Given 03/10/16 1826)     NEW OUTPATIENT MEDICATIONS STARTED DURING THIS VISIT:  None   Note:  This document was prepared using Dragon voice recognition software and may include unintentional dictation errors.  Alona Bene, MD Emergency Medicine   Maia Plan, MD 03/11/16 [phone]

## 2016-03-11 DIAGNOSIS — E1140 Type 2 diabetes mellitus with diabetic neuropathy, unspecified: Secondary | ICD-10-CM

## 2016-03-11 DIAGNOSIS — Z888 Allergy status to other drugs, medicaments and biological substances status: Secondary | ICD-10-CM | POA: Diagnosis not present

## 2016-03-11 DIAGNOSIS — D62 Acute posthemorrhagic anemia: Secondary | ICD-10-CM | POA: Diagnosis not present

## 2016-03-11 DIAGNOSIS — Z86718 Personal history of other venous thrombosis and embolism: Secondary | ICD-10-CM | POA: Diagnosis not present

## 2016-03-11 DIAGNOSIS — K5731 Diverticulosis of large intestine without perforation or abscess with bleeding: Secondary | ICD-10-CM | POA: Diagnosis not present

## 2016-03-11 DIAGNOSIS — Z9049 Acquired absence of other specified parts of digestive tract: Secondary | ICD-10-CM | POA: Diagnosis not present

## 2016-03-11 DIAGNOSIS — Z86711 Personal history of pulmonary embolism: Secondary | ICD-10-CM | POA: Diagnosis not present

## 2016-03-11 DIAGNOSIS — K922 Gastrointestinal hemorrhage, unspecified: Secondary | ICD-10-CM | POA: Diagnosis not present

## 2016-03-11 DIAGNOSIS — Z7984 Long term (current) use of oral hypoglycemic drugs: Secondary | ICD-10-CM | POA: Diagnosis not present

## 2016-03-11 DIAGNOSIS — K629 Disease of anus and rectum, unspecified: Secondary | ICD-10-CM | POA: Diagnosis not present

## 2016-03-11 DIAGNOSIS — I10 Essential (primary) hypertension: Secondary | ICD-10-CM | POA: Diagnosis present

## 2016-03-11 DIAGNOSIS — E038 Other specified hypothyroidism: Secondary | ICD-10-CM | POA: Diagnosis not present

## 2016-03-11 DIAGNOSIS — Z79899 Other long term (current) drug therapy: Secondary | ICD-10-CM | POA: Diagnosis not present

## 2016-03-11 DIAGNOSIS — K921 Melena: Secondary | ICD-10-CM | POA: Diagnosis not present

## 2016-03-11 DIAGNOSIS — Z881 Allergy status to other antibiotic agents status: Secondary | ICD-10-CM | POA: Diagnosis not present

## 2016-03-11 DIAGNOSIS — K5730 Diverticulosis of large intestine without perforation or abscess without bleeding: Secondary | ICD-10-CM | POA: Diagnosis not present

## 2016-03-11 DIAGNOSIS — E039 Hypothyroidism, unspecified: Secondary | ICD-10-CM | POA: Diagnosis present

## 2016-03-11 DIAGNOSIS — Z96659 Presence of unspecified artificial knee joint: Secondary | ICD-10-CM | POA: Diagnosis present

## 2016-03-11 DIAGNOSIS — E1149 Type 2 diabetes mellitus with other diabetic neurological complication: Secondary | ICD-10-CM | POA: Diagnosis present

## 2016-03-11 DIAGNOSIS — Z96642 Presence of left artificial hip joint: Secondary | ICD-10-CM | POA: Diagnosis present

## 2016-03-11 DIAGNOSIS — E785 Hyperlipidemia, unspecified: Secondary | ICD-10-CM | POA: Diagnosis present

## 2016-03-11 DIAGNOSIS — Z7901 Long term (current) use of anticoagulants: Secondary | ICD-10-CM | POA: Diagnosis not present

## 2016-03-11 DIAGNOSIS — Z79891 Long term (current) use of opiate analgesic: Secondary | ICD-10-CM | POA: Diagnosis not present

## 2016-03-11 DIAGNOSIS — Z933 Colostomy status: Secondary | ICD-10-CM | POA: Diagnosis not present

## 2016-03-11 DIAGNOSIS — Z87891 Personal history of nicotine dependence: Secondary | ICD-10-CM | POA: Diagnosis not present

## 2016-03-11 LAB — CBC
HCT: 32.9 % — ABNORMAL LOW (ref 36.0–46.0)
HCT: 36.3 % (ref 36.0–46.0)
Hemoglobin: 10.1 g/dL — ABNORMAL LOW (ref 12.0–15.0)
Hemoglobin: 11.4 g/dL — ABNORMAL LOW (ref 12.0–15.0)
MCH: 27.2 pg (ref 26.0–34.0)
MCH: 28.3 pg (ref 26.0–34.0)
MCHC: 30.7 g/dL (ref 30.0–36.0)
MCHC: 31.4 g/dL (ref 30.0–36.0)
MCV: 88.4 fL (ref 78.0–100.0)
MCV: 90.1 fL (ref 78.0–100.0)
PLATELETS: 214 10*3/uL (ref 150–400)
PLATELETS: 208 10*3/uL (ref 150–400)
RBC: 3.72 MIL/uL — ABNORMAL LOW (ref 3.87–5.11)
RBC: 4.03 MIL/uL (ref 3.87–5.11)
RDW: 16 % — AB (ref 11.5–15.5)
RDW: 16.4 % — ABNORMAL HIGH (ref 11.5–15.5)
WBC: 9.4 10*3/uL (ref 4.0–10.5)
WBC: 11.9 10*3/uL — ABNORMAL HIGH (ref 4.0–10.5)

## 2016-03-11 LAB — BASIC METABOLIC PANEL
ANION GAP: 7 (ref 5–15)
BUN: 16 mg/dL (ref 6–20)
CALCIUM: 8.9 mg/dL (ref 8.9–10.3)
CO2: 25 mmol/L (ref 22–32)
Chloride: 108 mmol/L (ref 101–111)
Creatinine, Ser: 0.99 mg/dL (ref 0.44–1.00)
GFR calc non Af Amer: 55 mL/min — ABNORMAL LOW (ref >60)
Glucose, Bld: 124 mg/dL — ABNORMAL HIGH (ref 65–99)
Potassium: 3.7 mmol/L (ref 3.5–5.1)
SODIUM: 140 mmol/L (ref 135–145)

## 2016-03-11 LAB — PROTIME-INR
INR: 1.2
PROTHROMBIN TIME: 15.3 s — AB (ref 11.4–15.2)

## 2016-03-11 LAB — GLUCOSE, CAPILLARY
GLUCOSE-CAPILLARY: 112 mg/dL — AB (ref 65–99)
Glucose-Capillary: 110 mg/dL — ABNORMAL HIGH (ref 65–99)
Glucose-Capillary: 118 mg/dL — ABNORMAL HIGH (ref 65–99)
Glucose-Capillary: 118 mg/dL — ABNORMAL HIGH (ref 65–99)

## 2016-03-11 MED ORDER — PEG 3350-KCL-NA BICARB-NACL 420 G PO SOLR
4000.0000 mL | Freq: Once | ORAL | Status: AC
  Administered 2016-03-11: 4000 mL via ORAL
  Filled 2016-03-11: qty 4000

## 2016-03-11 MED ORDER — POLYETHYLENE GLYCOL 3350 17 G PO PACK
17.0000 g | PACK | ORAL | Status: DC
  Administered 2016-03-11: 17 g via ORAL
  Filled 2016-03-11: qty 1

## 2016-03-11 NOTE — Consult Note (Signed)
[facility] CONSULT Reason for consult: rectal bleeding and colostomy bag Referring Physician: [facility] hospitalist. PCP: Colin Benton, D.O. Primary G.I.: none  Hannah [name] is an 76 y.o. female.  HPI: she has a somewhat complicated history. She had a colonic stricture diagnosed by Dr. Benson Norway in the past. He was unable to pass this with the colonoscope and she ended up having a sigmoid colectomy with a colostomy by Dr. Brantley Stage in 2016. She's had a history of a massive pulmonary embolism 2014 requiring IVC filter. She was on the ventilator for some time and has been on anticoagulation ever since. Her surgery was complicated by wound infection etc. There was some consideration apparently given to take down of her colostomy due to her multiple health problems it was decided not to do that. She has never had a colonoscopy and it is never been evaluated on the other side of her stricture. She has remained on Coumadin and has been doing reasonably well. She was admitted to the hospital bright red blood in her colostomy bag. Her BUN and creatinine have been normal. She's not had any weakness or shortness of breath. According to the records her Coumadin dosing has been somewhat challenging and has had to be continually adjusted.her hemoglobin was 10.9 her hemoglobin was 10.9 on admission and did dip to 9.4. INR was 2.9 and has been corrected to 1.2. The patient has had no abdominal pain and has been on clear liquids. Her other health problems include history of thyroid disease hypertension and diabetes.  Past Medical History:  Diagnosis Date  . Acute massive pulmonary embolism ([place]) 07/13/2012   Massive PE w/ PEA arrest 07/13/12 >TNK >IVC filter >discharged on comadin    . Diabetes mellitus, type 2 ([place]) 07/15/2012   with peripheral neuropathy  . Hyperlipemia 07/14/2012  . Hypertension 07/14/2012  . Large bowel stricture    s/p colectomy in 2016  . Osteoarthritis    s/p hip and knee replacements  .  Thyroid disease     Past Surgical History:  Procedure Laterality Date  . COLOSTOMY N/A 06/03/2014   Procedure: COLOSTOMY;  Surgeon: Erroll Luna, MD;  Location: [place];  Service: General;  Laterality: N/A;  . FLEXIBLE SIGMOIDOSCOPY N/A 05/30/2014   Procedure: Beryle Quant;  Surgeon: Carol Ada, MD;  Location: [facility] ENDOSCOPY;  Service: Endoscopy;  Laterality: N/A;  . INSERTION OF VENA CAVA FILTER N/A 07/16/2012   Procedure: INSERTION OF VENA CAVA FILTER;  Surgeon: Elam Dutch, MD;  Location: [facility] CATH LAB;  Service: Cardiovascular;  Laterality: N/A;  . KNEE ARTHROSCOPY    . PARTIAL COLECTOMY N/A 06/03/2014   Procedure: PARTIAL COLECTOMY;  Surgeon: Erroll Luna, MD;  Location: [place];  Service: General;  Laterality: N/A;  . SP ARTHRO HIP*L*      History reviewed. No pertinent family history.  Social History:  reports that she quit smoking about 48 years ago. Her smoking use included Cigarettes. She has a 10.00 pack-year smoking history. She has never used smokeless tobacco. She reports that she does not drink alcohol or use drugs.  Allergies:  Allergies  Allergen Reactions  . Augmentin [Amoxicillin-Pot Clavulanate] Other (See Comments)    Severe vaginal itching  . Tranxene [Clorazepate] Itching    Medications; Prior to Admission medications   Medication Sig Start Date End Date Taking? Authorizing Provider  acetaminophen (TYLENOL) 500 MG tablet Take 500 mg by mouth every 6 (six) hours as needed (pain).   Yes Historical Provider, MD  HYDROcodone-acetaminophen (NORCO/VICODIN) 5-325 MG  tablet Take 1 tablet by mouth every 6 (six) hours as needed. Patient taking differently: Take 1 tablet by mouth every 6 (six) hours as needed for moderate pain.  12/17/15  Yes Merryl Hacker, MD  levothyroxine (SYNTHROID, LEVOTHROID) 25 MCG tablet Take 1 tablet by mouth  daily before breakfast 04/10/15  Yes Lucretia Kern, DO  metFORMIN (GLUCOPHAGE) 1000 MG tablet 1074m (1 tablet) with  breakfast and 10023m(1 tablet) with dinner. Patient taking differently: Take 1,000 mg by mouth 2 (two) times daily with a meal.  04/10/15  Yes HaLucretia KernDO  metoprolol tartrate (LOPRESSOR) 25 MG tablet Take 1 tablet (25 mg total) by mouth 2 (two) times daily. 04/10/15  Yes HaLucretia KernDO  simvastatin (ZOCOR) 20 MG tablet Take 1 tablet (20 mg total) by mouth at bedtime. 04/10/15  Yes HaLucretia KernDO  vitamin C (ASCORBIC ACID) 500 MG tablet Take 500 mg by mouth daily.    Yes Historical Provider, MD  warfarin (COUMADIN) 5 MG tablet Take as directed by [facility] Patient taking differently: Take 2.5-5 mg by mouth See admin instructions. Take 2.5 mg on Tuesday and Friday ;  Take 5 mg on all other days 11/16/15  Yes MuBrand MalesMD   . insulin aspart  0-5 Units Subcutaneous QHS  . insulin aspart  0-9 Units Subcutaneous TID WC  . levothyroxine  25 mcg Oral QAC breakfast  . metoprolol tartrate  25 mg Oral BID  . polyethylene glycol  17 g Oral Q4H  . simvastatin  20 mg Oral QHS   PRN Meds acetaminophen **OR** acetaminophen, HYDROcodone-acetaminophen, ondansetron **OR** ondansetron (ZOFRAN) IV Results for orders placed or performed during the hospital encounter of 03/10/16 (from the past 48 hour(s))  Comprehensive metabolic panel     Status: Abnormal   Collection Time: 03/10/16  8:26 AM  Result Value Ref Range   Sodium 142 135 - 145 mmol/L   Potassium 3.9 3.5 - 5.1 mmol/L   Chloride 107 101 - 111 mmol/L   CO2 26 22 - 32 mmol/L   Glucose, Bld 107 (H) 65 - 99 mg/dL   BUN 15 6 - 20 mg/dL   Creatinine, Ser 1.05 (H) 0.44 - 1.00 mg/dL   Calcium 8.9 8.9 - 10.3 mg/dL   Total Protein 5.4 (L) 6.5 - 8.1 g/dL   Albumin 2.8 (L) 3.5 - 5.0 g/dL   AST 16 15 - 41 U/L   ALT 12 (L) 14 - 54 U/L   Alkaline Phosphatase 44 38 - 126 U/L   Total Bilirubin 0.5 0.3 - 1.2 mg/dL   GFR calc non Af Amer 51 (L) >60 mL/min   GFR calc Af Amer 59 (L) >60 mL/min    Comment: (NOTE) The eGFR has been calculated  using the CKD EPI equation. This calculation has not been validated in all clinical situations. eGFR's persistently <60 mL/min signify possible Chronic Kidney Disease.    Anion gap 9 5 - 15  CBC with Differential     Status: Abnormal   Collection Time: 03/10/16  8:26 AM  Result Value Ref Range   WBC 10.7 (H) 4.0 - 10.5 K/uL   RBC 3.95 3.87 - 5.11 MIL/uL   Hemoglobin 10.9 (L) 12.0 - 15.0 g/dL   HCT 35.3 (L) 36.0 - 46.0 %   MCV 89.4 78.0 - 100.0 fL   MCH 27.6 26.0 - 34.0 pg   MCHC 30.9 30.0 - 36.0 g/dL   RDW 16.3 (H) 11.5 -  15.5 %   Platelets 231 150 - 400 K/uL   Neutrophils Relative % 60 %   Neutro Abs 6.4 1.7 - 7.7 K/uL   Lymphocytes Relative 33 %   Lymphs Abs 3.5 0.7 - 4.0 K/uL   Monocytes Relative 5 %   Monocytes Absolute 0.6 0.1 - 1.0 K/uL   Eosinophils Relative 2 %   Eosinophils Absolute 0.2 0.0 - 0.7 K/uL   Basophils Relative 0 %   Basophils Absolute 0.0 0.0 - 0.1 K/uL  Protime-INR     Status: Abnormal   Collection Time: 03/10/16  8:26 AM  Result Value Ref Range   Prothrombin Time 30.6 (H) 11.4 - 15.2 seconds   INR 2.86   POC occult blood, ED Provider will collect     Status: Abnormal   Collection Time: 03/10/16  8:56 AM  Result Value Ref Range   Fecal Occult Bld POSITIVE (A) NEGATIVE  Type and screen [place]     Status: None   Collection Time: 03/10/16 10:52 AM  Result Value Ref Range   ABO/RH(D) O POS    Antibody Screen NEG    Sample Expiration 03/13/2016   Glucose, capillary     Status: Abnormal   Collection Time: 03/10/16  6:05 PM  Result Value Ref Range   Glucose-Capillary 116 (H) 65 - 99 mg/dL  CBC     Status: Abnormal   Collection Time: 03/10/16  7:21 PM  Result Value Ref Range   WBC 9.9 4.0 - 10.5 K/uL   RBC 3.44 (L) 3.87 - 5.11 MIL/uL   Hemoglobin 9.4 (L) 12.0 - 15.0 g/dL   HCT 30.5 (L) 36.0 - 46.0 %   MCV 88.7 78.0 - 100.0 fL   MCH 27.3 26.0 - 34.0 pg   MCHC 30.8 30.0 - 36.0 g/dL   RDW 16.0 (H) 11.5 - 15.5 %   Platelets 221  150 - 400 K/uL  Glucose, capillary     Status: Abnormal   Collection Time: 03/10/16  9:43 PM  Result Value Ref Range   Glucose-Capillary 126 (H) 65 - 99 mg/dL  Glucose, capillary     Status: Abnormal   Collection Time: 03/11/16  7:42 AM  Result Value Ref Range   Glucose-Capillary 110 (H) 65 - 99 mg/dL  Basic metabolic panel     Status: Abnormal   Collection Time: 03/11/16  9:38 AM  Result Value Ref Range   Sodium 140 135 - 145 mmol/L   Potassium 3.7 3.5 - 5.1 mmol/L   Chloride 108 101 - 111 mmol/L   CO2 25 22 - 32 mmol/L   Glucose, Bld 124 (H) 65 - 99 mg/dL   BUN 16 6 - 20 mg/dL   Creatinine, Ser 0.99 0.44 - 1.00 mg/dL   Calcium 8.9 8.9 - 10.3 mg/dL   GFR calc non Af Amer 55 (L) >60 mL/min   GFR calc Af Amer >60 >60 mL/min    Comment: (NOTE) The eGFR has been calculated using the CKD EPI equation. This calculation has not been validated in all clinical situations. eGFR's persistently <60 mL/min signify possible Chronic Kidney Disease.    Anion gap 7 5 - 15  Protime-INR     Status: Abnormal   Collection Time: 03/11/16  9:38 AM  Result Value Ref Range   Prothrombin Time 15.3 (H) 11.4 - 15.2 seconds   INR 1.20   CBC     Status: Abnormal   Collection Time: 03/11/16  9:38 AM  Result Value  Ref Range   WBC 9.4 4.0 - 10.5 K/uL   RBC 3.72 (L) 3.87 - 5.11 MIL/uL   Hemoglobin 10.1 (L) 12.0 - 15.0 g/dL   HCT 32.9 (L) 36.0 - 46.0 %   MCV 88.4 78.0 - 100.0 fL   MCH 27.2 26.0 - 34.0 pg   MCHC 30.7 30.0 - 36.0 g/dL   RDW 16.0 (H) 11.5 - 15.5 %   Platelets 214 150 - 400 K/uL  Glucose, capillary     Status: Abnormal   Collection Time: 03/11/16 11:06 AM  Result Value Ref Range   Glucose-Capillary 112 (H) 65 - 99 mg/dL                Blood pressure 132/62, pulse 70, temperature 98.4 F (36.9 C), temperature source Oral, resp. rate 18, height '5\' 7"'  (1.702 m), weight 78 kg (172 lb), SpO2 100 %.  Physical exam:   General-- pleasant African American female no acute  distress ENT-- nonicteric Neck-- supple Heart-- regular rate and rhythm without murmurs gallops Lungs-- clear Abdomen-- soft and nontender and nondistended with large surgical scar in the midline. Ostomy bag in the left lower quadrant reveals some melenic type stool and some bright red blood. Psych-- alert and oriented answers questions appropriately   Assessment: 1. Lower G.I. bleed. Probably diverticular. Apparently the patient is never had a full colonoscopy. I think clearly this needs to be done since she will definitely need to be back on anticoagulation as soon as possible and we do need to know where she's bleeding from. 2. History of massive pulmonary emboli on chronic anticoagulation has IVC filter 3. Status post sigmoid colectomy for benign stricture  Plan: 1. Schedule patient for colonoscopy tomorrow at 9 AM by Dr. Amedeo Plenty. Hopefully there will be no significant findings will be able to resume her anticoagulation in a timely manner. Have discussed this in detail with the patient and her family.   Kasiya Burck JR,Cristino Degroff L 03/11/2016, 2:45 PM   This note was created using voice recognition software and minor errors may Have occurred unintentionally. Pager: [phone] If no answer or after hours call [phone]   `

## 2016-03-11 NOTE — Progress Notes (Signed)
Colonoscopy prep initiated as ordered. Pt scheduled for 0900hrs colonoscopy tomorrow 

## 2016-03-11 NOTE — Progress Notes (Signed)
[facility] PROGRESS NOTE  Hannah [name] MVE:[id number] DOB: 1940-04-19 DOA: 03/10/2016  PCP: [name]., DO  Brief History/Interval Summary: 76 y.o. female with medical history significant of DVTs, diabetes, hyperlipidemia, hypertension, small bowel stricture status post colectomy with permanent colostomy, hypothyroidism. Patient presented with painless bright red blood per stoma. Patient is on warfarin and was noted to have therapeutic INR. She was hospitalized for further management.  Reason for Visit: GI bleeding  Consultants: Gastroenterology  Procedures: None yet  Antibiotics: None  Subjective/Interval History: Patient continues to have some bleeding into her ostomy bag. Denies any dizziness, lightheadedness. Denies any abdominal pain. No nausea or vomiting.  ROS: Denies any chest pain or shortness of breath.  Objective:  Vital Signs  Vitals:   03/10/16 1338 03/10/16 1406 03/10/16 2144 03/11/16 0454  BP: 166/94 (!) 157/66 (!) 125/58 132/62  Pulse: 88 78 63 70  Resp: 18 18 17 18   Temp:  98.4 F (36.9 C) 98.3 F (36.8 C) 98.4 F (36.9 C)  TempSrc:  Oral Oral Oral  SpO2: 100% 100% 100% 100%  Weight:  78 kg (172 lb)    Height:  5\' 7"  (1.702 m)      Intake/Output Summary (Last 24 hours) at 03/11/16 1327 Last data filed at 03/11/16 0456  Gross per 24 hour  Intake              540 ml  Output              395 ml  Net              145 ml   Filed Weights   03/10/16 0815 03/10/16 1406  Weight: 75.8 kg (167 lb) 78 kg (172 lb)    General appearance: alert, cooperative, appears stated age and no distress Resp: clear to auscultation bilaterally Cardio: regular rate and rhythm, S1, S2 normal, no murmur, click, rub or gallop GI: Abdomen is soft. Nontender. Ostomy bag is noted with blood. No masses or organomegaly. Extremities: extremities normal, atraumatic, no cyanosis or edema She is awake and alert. Oriented 3. No focal neurological deficits are  noted.  Lab Results:  Data Reviewed: I have personally reviewed following labs and imaging studies  CBC:  Recent Labs Lab 03/10/16 0826 03/10/16 1921 03/11/16 0938  WBC 10.7* 9.9 9.4  NEUTROABS 6.4  --   --   HGB 10.9* 9.4* 10.1*  HCT 35.3* 30.5* 32.9*  MCV 89.4 88.7 88.4  PLT 231 221 214    Basic Metabolic Panel:  Recent Labs Lab 03/10/16 0826 03/11/16 0938  NA 142 140  K 3.9 3.7  CL 107 108  CO2 26 25  GLUCOSE 107* 124*  BUN 15 16  CREATININE 1.05* 0.99  CALCIUM 8.9 8.9    GFR: Estimated Creatinine Clearance: 53.7 mL/min (by C-G formula based on SCr of 0.99 mg/dL).  Liver Function Tests:  Recent Labs Lab 03/10/16 0826  AST 16  ALT 12*  ALKPHOS 44  BILITOT 0.5  PROT 5.4*  ALBUMIN 2.8*    Coagulation Profile:  Recent Labs Lab 03/10/16 0826 03/11/16 0938  INR 2.86 1.20    CBG:  Recent Labs Lab 03/10/16 1805 03/10/16 2143 03/11/16 0742 03/11/16 1106  GLUCAP 116* 126* 110* 112*     Radiology Studies: No results found.   Medications:  Scheduled: . insulin aspart  0-5 Units Subcutaneous QHS  . insulin aspart  0-9 Units Subcutaneous TID WC  . levothyroxine  25 mcg Oral QAC breakfast  .  metoprolol tartrate  25 mg Oral BID  . polyethylene glycol  17 g Oral Q4H  . simvastatin  20 mg Oral QHS   Continuous:  ZOX:[id number] **OR** acetaminophen, HYDROcodone-acetaminophen, ondansetron **OR** ondansetron (ZOFRAN) IV  Assessment/Plan:  Active Problems:   Hyperlipemia   Hypothyroid   Hypertension   Diabetes mellitus with neurological manifestations, controlled (HCC)   GI bleed   History of DVT in adulthood   Acute blood loss anemia    GI bleed Bleeding noted into her ostomy bag. Patient was given vitamin K and INR has been reversed. Surprisingly, hemoglobin remains stable. Patient has seen Dr. [name] with GI in the past, however, apparently transfer herself out of their practice. Dr. Randa Evens with gastroenterology has been  consulted. Clear liquid diet for now. Plan is tentatively for colonoscopy tomorrow.  Acute blood loss anemia Likely from GI source from INR 2.86. Continue to monitor hemoglobin. Transfuse as needed.  History of DVT/PE H/o multiple large PEs. Last episode was in 2014. Patient also has an IVC filter in place. Patient has never been tried on one of the direct oral anticoagulants. This was discussed with her in the past, but she chose warfarin at that time. She is interested in one of these agents now. We'll need to address when she is ready to resume anticoagulation. Currently, warfarin is on hold due to GI bleed. INR has been reversed.   Hypothyroidism Continue synthroid  Diabetes mellitus type 2 CBGs are reasonably well controlled. Continue SSI.  History of essential hypertension  Blood pressure is stable. Continue metoprolol.  History of hyperlipidemia  Continue statin  DVT Prophylaxis: SCDs    Code Status: Full code  Family Communication: Discussed with the patient and her family members in the room with her permission  Disposition Plan: Management as outlined above.    LOS: 0 days   [facility]  [facility] Pager [phone] 03/11/2016, 1:27 PM  If 7PM-7AM, please contact night-coverage at [email or web address], password [facility]

## 2016-03-12 ENCOUNTER — Encounter (HOSPITAL_COMMUNITY)
Admission: EM | Disposition: A | Discharge status: Home / Self Care | Payer: Self-pay | Source: Home / Self Care | Attending: Internal Medicine

## 2016-03-12 ENCOUNTER — Encounter (HOSPITAL_COMMUNITY): Payer: Self-pay | Admitting: *Deleted

## 2016-03-12 DIAGNOSIS — E038 Other specified hypothyroidism: Secondary | ICD-10-CM

## 2016-03-12 HISTORY — PX: COLONOSCOPY: SHX5424

## 2016-03-12 LAB — BASIC METABOLIC PANEL
ANION GAP: 11 (ref 5–15)
BUN: 12 mg/dL (ref 6–20)
CHLORIDE: 105 mmol/L (ref 101–111)
CO2: 24 mmol/L (ref 22–32)
Calcium: 8.7 mg/dL — ABNORMAL LOW (ref 8.9–10.3)
Creatinine, Ser: 0.91 mg/dL (ref 0.44–1.00)
GFR calc Af Amer: >60 mL/min (ref >60)
GLUCOSE: 123 mg/dL — AB (ref 65–99)
POTASSIUM: 3.6 mmol/L (ref 3.5–5.1)
Sodium: 140 mmol/L (ref 135–145)

## 2016-03-12 LAB — GLUCOSE, CAPILLARY
GLUCOSE-CAPILLARY: 97 mg/dL (ref 65–99)
GLUCOSE-CAPILLARY: 128 mg/dL — AB (ref 65–99)
Glucose-Capillary: 170 mg/dL — ABNORMAL HIGH (ref 65–99)
Glucose-Capillary: 97 mg/dL (ref 65–99)

## 2016-03-12 LAB — CBC
HEMATOCRIT: 29 % — AB (ref 36.0–46.0)
HEMOGLOBIN: 9.1 g/dL — AB (ref 12.0–15.0)
MCH: 27.6 pg (ref 26.0–34.0)
MCHC: 31.4 g/dL (ref 30.0–36.0)
MCV: 87.9 fL (ref 78.0–100.0)
Platelets: 217 10*3/uL (ref 150–400)
RBC: 3.3 MIL/uL — AB (ref 3.87–5.11)
RDW: 15.7 % — ABNORMAL HIGH (ref 11.5–15.5)
WBC: 9.5 10*3/uL (ref 4.0–10.5)

## 2016-03-12 LAB — PROTIME-INR
INR: 1.13
Prothrombin Time: 14.6 seconds (ref 11.4–15.2)

## 2016-03-12 SURGERY — COLONOSCOPY
Anesthesia: Moderate Sedation

## 2016-03-12 MED ORDER — DIPHENHYDRAMINE HCL 50 MG/ML IJ SOLN
INTRAMUSCULAR | Status: AC
  Filled 2016-03-12: qty 1

## 2016-03-12 MED ORDER — FENTANYL CITRATE (PF) 100 MCG/2ML IJ SOLN
INTRAMUSCULAR | Status: AC
Start: 2016-03-12 — End: 2016-03-12
  Filled 2016-03-12: qty 2

## 2016-03-12 MED ORDER — MIDAZOLAM HCL 5 MG/ML IJ SOLN
INTRAMUSCULAR | Status: AC
  Filled 2016-03-12: qty 2

## 2016-03-12 MED ORDER — WARFARIN - PHARMACIST DOSING INPATIENT: Freq: Every day | Status: DC

## 2016-03-12 MED ORDER — FENTANYL CITRATE (PF) 100 MCG/2ML IJ SOLN
INTRAMUSCULAR | Status: DC | PRN
  Administered 2016-03-12 (×4): 25 ug via INTRAVENOUS

## 2016-03-12 MED ORDER — DIPHENHYDRAMINE HCL 50 MG/ML IJ SOLN
INTRAMUSCULAR | Status: DC | PRN
  Administered 2016-03-12 (×2): 12.5 mg via INTRAVENOUS

## 2016-03-12 MED ORDER — SODIUM CHLORIDE 0.9 % IV SOLN
INTRAVENOUS | Status: DC
  Administered 2016-03-12: 500 mL via INTRAVENOUS

## 2016-03-12 MED ORDER — WARFARIN SODIUM 5 MG PO TABS
5.0000 mg | ORAL_TABLET | Freq: Once | ORAL | Status: AC
  Administered 2016-03-12: 5 mg via ORAL
  Filled 2016-03-12: qty 1

## 2016-03-12 NOTE — Progress Notes (Signed)
GI: Colonoscopy done, pristine prep with no rash or residual blood, only a few mainly left-sided scattered diverticuli found, presumably one of these was the source for bleeding. Will advance diet, okay to resume Coumadin given this is a first time bleed. We will sign off for now.

## 2016-03-12 NOTE — Progress Notes (Signed)
ANTICOAGULATION CONSULT NOTE - Initial Consult  Pharmacy Consult for Coumadin Indication: history of DVT, PE  Allergies  Allergen Reactions  . Augmentin [Amoxicillin-Pot Clavulanate] Other (See Comments)    Severe vaginal itching  . Tranxene [Clorazepate] Itching    Patient Measurements: Height: 5\' 7"  (170.2 cm) Weight: 172 lb (78 kg) IBW/kg (Calculated) : 61.6 Heparin Dosing Weight:   Vital Signs: Temp: 97.3 F (36.3 C) (03/10 1021) Temp Source: Oral (03/10 1021) BP: 155/79 (03/10 1021) Pulse Rate: 90 (03/10 1021)  Labs:  Recent Labs  03/10/16 0826  03/11/16 0938 03/11/16 1624 03/12/16 0503  HGB 10.9*  < > 10.1* 11.4* 9.1*  HCT 35.3*  < > 32.9* 36.3 29.0*  PLT 231  < > 214 208 217  LABPROT 30.6*  --  15.3*  --  14.6  INR 2.86  --  1.20  --  1.13  CREATININE 1.05*  --  0.99  --  0.91  < > = values in this interval not displayed.  Estimated Creatinine Clearance: 58.4 mL/min (by C-G formula based on SCr of 0.91 mg/dL).   Medical History: Past Medical History:  Diagnosis Date  . Acute massive pulmonary embolism (HCC) 07/13/2012   Massive PE w/ PEA arrest 07/13/12 >TNK >IVC filter >discharged on comadin    . Diabetes mellitus, type 2 (HCC) 07/15/2012   with peripheral neuropathy  . Hyperlipemia 07/14/2012  . Hypertension 07/14/2012  . Large bowel stricture    s/p colectomy in 2016  . Osteoarthritis    s/p hip and knee replacements  . Thyroid disease     Medications:  Scheduled:  . insulin aspart  0-5 Units Subcutaneous QHS  . insulin aspart  0-9 Units Subcutaneous TID WC  . levothyroxine  25 mcg Oral QAC breakfast  . metoprolol tartrate  25 mg Oral BID  . simvastatin  20 mg Oral QHS    Assessment: 76yo female admitted with GI bleed, BRB per stoma.  INR 2.86 on home Coumadin 5mg  daily, except 2.5mg  Tue/Fri at admission.  Vitamin K 5mg  IV given on 3/8 with INR 1.13 this AM.  Now s/p colonoscopy, scattered divericuli, no bleeding, ok to resume Coumadin since  first time bleed per GI.  Initially had considered change to Apixaban, but now resuming Coumadin per patient decision.  Goal of Therapy:  INR 2-3 Monitor platelets by anticoagulation protocol: Yes   Plan:  Coumadin 5mg  po today Daily INR Watch for s/s of recurrent bleeding  Marisue Humble, PharmD Clinical Pharmacist [facility]

## 2016-03-12 NOTE — Progress Notes (Signed)
[facility] PROGRESS NOTE  Hannah [name] NWG:[id number] DOB: 1940/01/28 DOA: 03/10/2016  PCP: [name]., DO  Brief History/Interval Summary: 76 y.o. female with medical history significant of DVTs, diabetes, hyperlipidemia, hypertension, small bowel stricture status post colectomy with permanent colostomy, hypothyroidism. Patient presented with painless bright red blood per stoma. Patient is on warfarin and was noted to have therapeutic INR. She was hospitalized for further management.  Reason for Visit: GI bleeding  Consultants: Gastroenterology  Procedures:   Colonoscopy Impression:              - Diverticulosis in the sigmoid colon and in the descending colon. - The examination was otherwise normal. - No specimens collected.  Antibiotics: None  Subjective/Interval History: Patient hasn't had any further episodes of bleeding into her ostomy bag. Denies any abdominal pain. Denies any nausea or vomiting. Her daughters are at the bedside.   ROS: Denies any chest pain or shortness of breath.  Objective:  Vital Signs  Vitals:   03/11/16 0454 03/11/16 1500 03/11/16 2052 03/12/16 0611  BP: 132/62 (!) 167/91 (!) 145/82 (!) 142/65  Pulse: 70 66 96 93  Resp: 18 17 19 19   Temp: 98.4 F (36.9 C) 97.4 F (36.3 C) 97.5 F (36.4 C) 97.7 F (36.5 C)  TempSrc: Oral Oral Oral Oral  SpO2: 100% 100% 100% 100%  Weight:      Height:        Intake/Output Summary (Last 24 hours) at 03/12/16 5784 Last data filed at 03/12/16 0700  Gross per 24 hour  Intake                0 ml  Output              300 ml  Net             -300 ml   Filed Weights   03/10/16 0815 03/10/16 1406  Weight: 75.8 kg (167 lb) 78 kg (172 lb)    General appearance: alert, cooperative, appears stated age and no distress Resp: clear to auscultation bilaterally Cardio: regular rate and rhythm, S1, S2 normal, no murmur, click, rub or gallop GI: Abdomen is soft. Remains nontender. No blood noted in  the ostomy back today. Yellow stool is present. No masses or organomegaly. Extremities: extremities normal, atraumatic, no cyanosis or edema She is awake and alert. Oriented 3. No focal neurological deficits are noted.  Lab Results:  Data Reviewed: I have personally reviewed following labs and imaging studies  CBC:  Recent Labs Lab 03/10/16 0826 03/10/16 1921 03/11/16 0938 03/11/16 1624 03/12/16 0503  WBC 10.7* 9.9 9.4 11.9* 9.5  NEUTROABS 6.4  --   --   --   --   HGB 10.9* 9.4* 10.1* 11.4* 9.1*  HCT 35.3* 30.5* 32.9* 36.3 29.0*  MCV 89.4 88.7 88.4 90.1 87.9  PLT 231 221 214 208 217    Basic Metabolic Panel:  Recent Labs Lab 03/10/16 0826 03/11/16 0938 03/12/16 0503  NA 142 140 140  K 3.9 3.7 3.6  CL 107 108 105  CO2 26 25 24   GLUCOSE 107* 124* 123*  BUN 15 16 12   CREATININE 1.05* 0.99 0.91  CALCIUM 8.9 8.9 8.7*    GFR: Estimated Creatinine Clearance: 58.4 mL/min (by C-G formula based on SCr of 0.91 mg/dL).  Liver Function Tests:  Recent Labs Lab 03/10/16 0826  AST 16  ALT 12*  ALKPHOS 44  BILITOT 0.5  PROT 5.4*  ALBUMIN 2.8*  Coagulation Profile:  Recent Labs Lab 03/10/16 0826 03/11/16 0938 03/12/16 0503  INR 2.86 1.20 1.13    CBG:  Recent Labs Lab 03/10/16 2143 03/11/16 0742 03/11/16 1106 03/11/16 1752 03/11/16 2114  GLUCAP 126* 110* 112* 118* 118*     Radiology Studies: No results found.   Medications:  Scheduled: . insulin aspart  0-5 Units Subcutaneous QHS  . insulin aspart  0-9 Units Subcutaneous TID WC  . levothyroxine  25 mcg Oral QAC breakfast  . metoprolol tartrate  25 mg Oral BID  . simvastatin  20 mg Oral QHS   Continuous:  GBT:[id number] **OR** acetaminophen, HYDROcodone-acetaminophen, ondansetron **OR** ondansetron (ZOFRAN) IV  Assessment/Plan:  Active Problems:   Hyperlipemia   Hypothyroid   Hypertension   Diabetes mellitus with neurological manifestations, controlled (HCC)   GI bleed    History of DVT in adulthood   Acute blood loss anemia    GI bleed Patient presented with bleeding into her ostomy bag. She was noted to have therapeutic INR. This was reversed with vitamin K. She was seen by gastroenterology. Patient underwent colonoscopy today which shows diverticulosis. No active bleeding was noted. Bleeding appears to have subsided. Warfarin could be resumed today per gastroenterology. Diet to be advanced.  Acute blood loss anemia Likely from GI source. Hemoglobin remained stable. Has not required any blood transfusions so far.   History of DVT/PE H/o multiple large PEs. Last episode was in 2014. Patient also has an IVC filter in place. Patient has never been tried on one of the direct oral anticoagulants. This was discussed with her in the past, but she chose warfarin at that time. Discussed again with the patient today. She chooses to continue with warfarin at this time. Warfarin to be resumed today. No need for any bridging treatment at this time.  Hypothyroidism Continue synthroid  Diabetes mellitus type 2 CBGs are reasonably well controlled. Continue SSI.  History of essential hypertension  Blood pressure is stable. Continue metoprolol.  History of hyperlipidemia  Continue statin  DVT Prophylaxis: SCDs    Code Status: Full code  Family Communication: Discussed with the patient and her family members in the room with her permission  Disposition Plan: Management as outlined above. Warfarin to be resumed today. Repeat hemoglobin tomorrow. If she doesn't have any further episodes of bleeding and if her hemoglobin remains stable, she could be discharged tomorrow.    LOS: 1 day   [facility]  [facility] Pager [phone] 03/12/2016, 8:08 AM  If 7PM-7AM, please contact night-coverage at [email or web address], password [facility]

## 2016-03-12 NOTE — H&P (View-Only) (Signed)
Colonoscopy prep initiated as ordered. Pt scheduled for 0900hrs colonoscopy tomorrow

## 2016-03-12 NOTE — Interval H&P Note (Signed)
History and Physical Interval Note:  03/12/2016 9:16 AM  Hannah [name]  has presented today for surgery, with the diagnosis of LGI bleed  The various methods of treatment have been discussed with the patient and family. After consideration of risks, benefits and other options for treatment, the patient has consented to  Procedure(s): COLONOSCOPY (N/A) as a surgical intervention .  The patient's history has been reviewed, patient examined, no change in status, stable for surgery.  I have reviewed the patient's chart and labs.  Questions were answered to the patient's satisfaction.     Naveen Lorusso C

## 2016-03-12 NOTE — Op Note (Addendum)
[facility] Patient Name: Hannah [name] Procedure Date : 03/12/2016 MRN: [id number] Attending MD: Barrie Folk , MD Date of Birth: 03-10-1940 CSN: [id number] Age: 76 Admit Type: Inpatient Procedure:                Colonoscopy Indications:              Hematochezia Providers:                Everardo All. Madilyn Fireman, MD, Dwain Sarna, RN, Beryle Beams, Technician Referring MD:              Medicines:                Fentanyl 100 micrograms IV, Diphenhydramine 50 mg IV Complications:            No immediate complications. Estimated Blood Loss:     Estimated blood loss: none. Procedure:                Pre-Anesthesia Assessment:                           - Prior to the procedure, a History and Physical                            was performed, and patient medications and                            allergies were reviewed. The patient's tolerance of                            previous anesthesia was also reviewed. The risks                            and benefits of the procedure and the sedation                            options and risks were discussed with the patient.                            All questions were answered, and informed consent                            was obtained. Prior Anticoagulants: The patient has                            taken Coumadin (warfarin), last dose was 3 days                            prior to procedure. ASA Grade Assessment: III - A                            patient with severe systemic disease. After  reviewing the risks and benefits, the patient was                            deemed in satisfactory condition to undergo the                            procedure.                           After obtaining informed consent, the colonoscope                            was passed under direct vision. Throughout the                            procedure, the patient's blood pressure, pulse, and                          oxygen saturations were monitored continuously. The                            EC-[id number] ([id number]) scope was introduced through                            the transverse colostomy and advanced to the the                            cecum, identified by appendiceal orifice and                            ileocecal valve. The colonoscopy was performed                            without difficulty. The patient tolerated the                            procedure well. The quality of the bowel                            preparation was excellent. The ileocecal valve,                            appendiceal orifice, and rectum were photographed. Scope In: 9:32:36 AM Scope Out: 9:39:36 AM Scope Withdrawal Time: 0 hours 4 minutes 30 seconds  Total Procedure Duration: 0 hours 7 minutes 0 seconds  Findings:      Scattered diverticula were found in the sigmoid colon and descending       colon.      The exam was otherwise without abnormality. Impression:               - Diverticulosis in the sigmoid colon and in the                            descending colon.                           -  The examination was otherwise normal.                           - No specimens collected. Moderate Sedation:      Moderate (conscious) sedation was administered by the endoscopy nurse       and supervised by the endoscopist. The following parameters were       monitored: oxygen saturation, heart rate, blood pressure, respiratory       rate, EKG, adequacy of pulmonary ventilation, and response to care. Recommendation:           - Patient has a contact number available for                            emergencies. The signs and symptoms of potential                            delayed complications were discussed with the                            patient. Return to normal activities tomorrow.                            Written discharge instructions were provided to the                             patient.                           - Resume Coumadin (warfarin) at prior dose today.                           - Resume previous diet.                           - No repeat colonoscopy due to age and the absence                            of advanced adenomas. Procedure Code(s):        --- Professional ---                           [phone], Colonoscopy through stoma; diagnostic,                            including collection of specimen(s) by brushing or                            washing, when performed (separate procedure) Diagnosis Code(s):        --- Professional ---                           K92.1, Melena (includes Hematochezia)                           K57.30, Diverticulosis of large intestine without  perforation or abscess without bleeding CPT copyright 2016 [employer]. All rights reserved. The codes documented in this report are preliminary and upon coder review may  be revised to meet current compliance requirements. Barrie Folk, MD 03/12/2016 9:51:40 AM This report has been signed electronically. Number of Addenda: 0

## 2016-03-13 ENCOUNTER — Encounter (HOSPITAL_COMMUNITY): Payer: Self-pay | Admitting: Gastroenterology

## 2016-03-13 LAB — CBC
HCT: 26.7 % — ABNORMAL LOW (ref 36.0–46.0)
HEMATOCRIT: 29 % — AB (ref 36.0–46.0)
HEMOGLOBIN: 8.2 g/dL — AB (ref 12.0–15.0)
Hemoglobin: 8.9 g/dL — ABNORMAL LOW (ref 12.0–15.0)
MCH: 27.3 pg (ref 26.0–34.0)
MCH: 27.6 pg (ref 26.0–34.0)
MCHC: 30.7 g/dL (ref 30.0–36.0)
MCHC: 30.7 g/dL (ref 30.0–36.0)
MCV: 89 fL (ref 78.0–100.0)
MCV: 89.8 fL (ref 78.0–100.0)
PLATELETS: 179 10*3/uL (ref 150–400)
PLATELETS: 222 10*3/uL (ref 150–400)
RBC: 3 MIL/uL — AB (ref 3.87–5.11)
RBC: 3.23 MIL/uL — AB (ref 3.87–5.11)
RDW: 16.2 % — ABNORMAL HIGH (ref 11.5–15.5)
RDW: 16.2 % — AB (ref 11.5–15.5)
WBC: 7.3 10*3/uL (ref 4.0–10.5)
WBC: 8.4 10*3/uL (ref 4.0–10.5)

## 2016-03-13 LAB — BASIC METABOLIC PANEL
ANION GAP: 7 (ref 5–15)
BUN: 16 mg/dL (ref 6–20)
CHLORIDE: 108 mmol/L (ref 101–111)
CO2: 24 mmol/L (ref 22–32)
Calcium: 8.4 mg/dL — ABNORMAL LOW (ref 8.9–10.3)
Creatinine, Ser: 0.96 mg/dL (ref 0.44–1.00)
GFR calc Af Amer: >60 mL/min (ref >60)
GFR calc non Af Amer: 57 mL/min — ABNORMAL LOW (ref >60)
GLUCOSE: 95 mg/dL (ref 65–99)
POTASSIUM: 3.9 mmol/L (ref 3.5–5.1)
Sodium: 139 mmol/L (ref 135–145)

## 2016-03-13 LAB — GLUCOSE, CAPILLARY
GLUCOSE-CAPILLARY: 99 mg/dL (ref 65–99)
GLUCOSE-CAPILLARY: 142 mg/dL — AB (ref 65–99)
GLUCOSE-CAPILLARY: 198 mg/dL — AB (ref 65–99)
GLUCOSE-CAPILLARY: 175 mg/dL — AB (ref 65–99)
Glucose-Capillary: 117 mg/dL — ABNORMAL HIGH (ref 65–99)

## 2016-03-13 LAB — PROTIME-INR
INR: 1.12
PROTHROMBIN TIME: 14.5 s (ref 11.4–15.2)

## 2016-03-13 MED ORDER — WARFARIN SODIUM 5 MG PO TABS
5.0000 mg | ORAL_TABLET | Freq: Once | ORAL | Status: AC
  Administered 2016-03-13: 5 mg via ORAL
  Filled 2016-03-13: qty 1

## 2016-03-13 NOTE — Progress Notes (Signed)
[facility] PROGRESS NOTE  Hannah [name] ZOX:[id number] DOB: 1940/12/12 DOA: 03/10/2016  PCP: [name]., DO  Brief History/Interval Summary: 76 y.o. female with medical history significant of DVTs, diabetes, hyperlipidemia, hypertension, small bowel stricture status post colectomy with permanent colostomy, hypothyroidism. Patient presented with painless bright red blood per stoma. Patient is on warfarin and was noted to have therapeutic INR. She was hospitalized for further management. She was seen by gastroenterology. She underwent colonoscopy and no bleeding was noted.  Reason for Visit: GI bleeding  Consultants: Gastroenterology  Procedures:   Colonoscopy Impression:              - Diverticulosis in the sigmoid colon and in the descending colon. - The examination was otherwise normal. - No specimens collected.  Antibiotics: None  Subjective/Interval History: Patient feels well. Does have a headache this morning. Has not had any further bleeding from her ostomy. Concerned that she hasn't had a bowel movement yet. Denies any nausea or vomiting.   ROS: Denies any chest pain or shortness of breath.  Objective:  Vital Signs  Vitals:   03/12/16 1351 03/12/16 2143 03/12/16 2234 03/13/16 0517  BP: 126/75 (!) 110/48 (!) 132/49 (!) 141/63  Pulse: 88 82 80 69  Resp: 19 18  18   Temp: 98.1 F (36.7 C) 98.4 F (36.9 C)  98.2 F (36.8 C)  TempSrc: Oral Oral  Oral  SpO2: 100% 99%  100%  Weight:      Height:        Intake/Output Summary (Last 24 hours) at 03/13/16 8119 Last data filed at 03/13/16 1478  Gross per 24 hour  Intake              240 ml  Output                0 ml  Net              240 ml   Filed Weights   03/10/16 0815 03/10/16 1406  Weight: 75.8 kg (167 lb) 78 kg (172 lb)    General appearance: alert, cooperative, appears stated age and no distress Resp: clear to auscultation bilaterally Cardio: regular rate and rhythm, S1, S2 normal, no murmur,  click, rub or gallop GI: Abdomen is soft. Remains nontender. No blood noted in the ostomy back today. Small amount of Yellow liquid stool is present. No masses or organomegaly. Extremities: extremities normal, atraumatic, no cyanosis or edema She is awake and alert. Oriented 3. No focal neurological deficits are noted.  Lab Results:  Data Reviewed: I have personally reviewed following labs and imaging studies  CBC:  Recent Labs Lab 03/10/16 0826 03/10/16 1921 03/11/16 0938 03/11/16 1624 03/12/16 0503 03/13/16 0455  WBC 10.7* 9.9 9.4 11.9* 9.5 7.3  NEUTROABS 6.4  --   --   --   --   --   HGB 10.9* 9.4* 10.1* 11.4* 9.1* 8.2*  HCT 35.3* 30.5* 32.9* 36.3 29.0* 26.7*  MCV 89.4 88.7 88.4 90.1 87.9 89.0  PLT 231 221 214 208 217 179    Basic Metabolic Panel:  Recent Labs Lab 03/10/16 0826 03/11/16 0938 03/12/16 0503 03/13/16 0455  NA 142 140 140 139  K 3.9 3.7 3.6 3.9  CL 107 108 105 108  CO2 26 25 24 24   GLUCOSE 107* 124* 123* 95  BUN 15 16 12 16   CREATININE 1.05* 0.99 0.91 0.96  CALCIUM 8.9 8.9 8.7* 8.4*    GFR: Estimated Creatinine Clearance: 55.4 mL/min (  by C-G formula based on SCr of 0.96 mg/dL).  Liver Function Tests:  Recent Labs Lab 03/10/16 0826  AST 16  ALT 12*  ALKPHOS 44  BILITOT 0.5  PROT 5.4*  ALBUMIN 2.8*    Coagulation Profile:  Recent Labs Lab 03/10/16 0826 03/11/16 0938 03/12/16 0503 03/13/16 0455  INR 2.86 1.20 1.13 1.12    CBG:  Recent Labs Lab 03/12/16 0809 03/12/16 1130 03/12/16 1656 03/12/16 2145 03/13/16 0801  GLUCAP 97 170* 97 128* 99     Radiology Studies: No results found.   Medications:  Scheduled: . insulin aspart  0-5 Units Subcutaneous QHS  . insulin aspart  0-9 Units Subcutaneous TID WC  . levothyroxine  25 mcg Oral QAC breakfast  . metoprolol tartrate  25 mg Oral BID  . simvastatin  20 mg Oral QHS  . Warfarin - Pharmacist Dosing Inpatient   Does not apply q1800    Continuous:  AYO:[id number] **OR** acetaminophen, HYDROcodone-acetaminophen, ondansetron **OR** ondansetron (ZOFRAN) IV  Assessment/Plan:  Active Problems:   Hyperlipemia   Hypothyroid   Hypertension   Diabetes mellitus with neurological manifestations, controlled (HCC)   GI bleed   History of DVT in adulthood   Acute blood loss anemia    GI bleed Patient presented with bleeding into her ostomy bag. She was noted to have therapeutic INR. This was reversed with vitamin K. She was seen by gastroenterology. Patient underwent colonoscopy today which showed diverticulosis. No active bleeding was noted. Bleeding appears to have subsided. Warfarin was resumed. She hasn't had any further episodes of bleeding. However, hemoglobin noted to be lower today compared to yesterday. We'll continue to monitor.  Acute blood loss anemia Likely from GI source. Patient hemoglobin had been stable, however, drop in hemoglobin noted today. She however has not had any further episodes of bleeding. This could just be the hemoglobin leveling off. We will monitor her for another day since she will be on anticoagulation.  History of DVT/PE H/o multiple large PEs. Last episode was in 2014. Patient also has an IVC filter in place. Patient has never been tried on one of the direct oral anticoagulants. Discussed again with the patient. She chooses to continue with warfarin at this time. Warfarin was resumed. No need for any bridging treatment at this time.  Hypothyroidism Continue synthroid  Diabetes mellitus type 2 CBGs are reasonably well controlled. Continue SSI.  History of essential hypertension  Blood pressure is stable. Continue metoprolol.  History of hyperlipidemia  Continue statin  DVT Prophylaxis: SCDs    Code Status: Full code  Family Communication: Discussed with the patient. Disposition Plan: Due to drop in hemoglobin this morning, we will monitor her for another day.    LOS: 2  days   [facility]  [facility] Pager [phone] 03/13/2016, 9:38 AM  If 7PM-7AM, please contact night-coverage at [email or web address], password [facility]

## 2016-03-13 NOTE — Progress Notes (Signed)
ANTICOAGULATION CONSULT NOTE - Follow Up Consult  Pharmacy Consult for Coumadin Indication: hx of PE, DVT  Allergies  Allergen Reactions  . Augmentin [Amoxicillin-Pot Clavulanate] Other (See Comments)    Severe vaginal itching  . Tranxene [Clorazepate] Itching    Patient Measurements: Height: 5\' 7"  (170.2 cm) Weight: 172 lb (78 kg) IBW/kg (Calculated) : 61.6  Vital Signs: Temp: 98.2 F (36.8 C) (03/11 0517) Temp Source: Oral (03/11 0517) BP: 141/63 (03/11 0517) Pulse Rate: 69 (03/11 0517)  Labs:  Recent Labs  03/11/16 0938 03/11/16 1624 03/12/16 0503 03/13/16 0455  HGB 10.1* 11.4* 9.1* 8.2*  HCT 32.9* 36.3 29.0* 26.7*  PLT 214 208 217 179  LABPROT 15.3*  --  14.6 14.5  INR 1.20  --  1.13 1.12  CREATININE 0.99  --  0.91 0.96    Estimated Creatinine Clearance: 55.4 mL/min (by C-G formula based on SCr of 0.96 mg/dL).   Medications:  Scheduled:  . insulin aspart  0-5 Units Subcutaneous QHS  . insulin aspart  0-9 Units Subcutaneous TID WC  . levothyroxine  25 mcg Oral QAC breakfast  . metoprolol tartrate  25 mg Oral BID  . simvastatin  20 mg Oral QHS  . Warfarin - Pharmacist Dosing Inpatient   Does not apply q1800    Assessment: 76yo female admitted with GI bleed, BRB per stoma.  INR 2.86 on home Coumadin 5mg  daily, except 2.5mg  Tue/Fri at admission.  Vitamin K 5mg  IV given on 3/8.  Coumadin resumed 3/10,  s/p colonoscopy, scattered divericuli, no bleeding, ok to resume Coumadin since first time bleed per GI.  Initially had considered change to Apixaban, but now resuming Coumadin per patient decision.  INR 1.12, no bleeding noted.  May need smaller dose of Coumadin  Goal of Therapy:  INR 2-3 Monitor platelets by anticoagulation protocol: Yes   Plan:  Repeat Coumadin 5mg  today Daily INR Watch for s/s of bleeding  Marisue Humble, PharmD Clinical Pharmacist [facility]

## 2016-03-14 DIAGNOSIS — K5731 Diverticulosis of large intestine without perforation or abscess with bleeding: Principal | ICD-10-CM

## 2016-03-14 LAB — BASIC METABOLIC PANEL
ANION GAP: 5 (ref 5–15)
BUN: 15 mg/dL (ref 6–20)
CALCIUM: 8.7 mg/dL — AB (ref 8.9–10.3)
CO2: 28 mmol/L (ref 22–32)
Chloride: 107 mmol/L (ref 101–111)
Creatinine, Ser: 1.02 mg/dL — ABNORMAL HIGH (ref 0.44–1.00)
GFR calc non Af Amer: 53 mL/min — ABNORMAL LOW (ref >60)
GLUCOSE: 112 mg/dL — AB (ref 65–99)
Potassium: 3.9 mmol/L (ref 3.5–5.1)
Sodium: 140 mmol/L (ref 135–145)

## 2016-03-14 LAB — CBC
HCT: 26.9 % — ABNORMAL LOW (ref 36.0–46.0)
Hemoglobin: 8.4 g/dL — ABNORMAL LOW (ref 12.0–15.0)
MCH: 27.7 pg (ref 26.0–34.0)
MCHC: 31.2 g/dL (ref 30.0–36.0)
MCV: 88.8 fL (ref 78.0–100.0)
PLATELETS: 206 10*3/uL (ref 150–400)
RBC: 3.03 MIL/uL — ABNORMAL LOW (ref 3.87–5.11)
RDW: 16.1 % — AB (ref 11.5–15.5)
WBC: 7.7 10*3/uL (ref 4.0–10.5)

## 2016-03-14 LAB — GLUCOSE, CAPILLARY: GLUCOSE-CAPILLARY: 103 mg/dL — AB (ref 65–99)

## 2016-03-14 LAB — PROTIME-INR
INR: 1.1
Prothrombin Time: 14.3 seconds (ref 11.4–15.2)

## 2016-03-14 NOTE — Discharge Summary (Signed)
[facility]  Physician Discharge Summary   Patient ID: Hannah [name] MRN: [id number] DOB/AGE: Jun 17, 1940 76 y.o.  Admit date: 03/10/2016 Discharge date: 03/14/2016  PCP: [name]., DO  DISCHARGE DIAGNOSES:  Active Problems:   Hyperlipemia   Hypothyroid   Hypertension   Diabetes mellitus with neurological manifestations, controlled (HCC)   GI bleed   History of DVT in adulthood   Acute blood loss anemia   RECOMMENDATIONS FOR OUTPATIENT FOLLOW UP: 1. Outpatient follow-up with primary care physician. She will need blood work check her hemoglobin 2. Patient already has an appointment with Coumadin clinic on Friday   DISCHARGE CONDITION: good  Diet recommendation: As before  [facility] Weights   03/10/16 0815 03/10/16 1406  Weight: 75.8 kg (167 lb) 78 kg (172 lb)    INITIAL HISTORY: 76 y.o.femalewith medical history significant of DVTs, diabetes, hyperlipidemia, hypertension, small bowel stricture status post colectomy with permanent colostomy, hypothyroidism. Patient presented with painless bright red blood per stoma. Patient is on warfarin and was noted to have therapeutic INR. She was hospitalized for further management. She was seen by gastroenterology. She underwent colonoscopy and no bleeding was noted.  Consultants: Gastroenterology  Procedures:   Colonoscopy Impression:  - Diverticulosis in the sigmoid colon and in the descending colon. - The examination was otherwise normal. - No specimens collected.   HOSPITAL COURSE:   GI bleed Patient presented with bleeding into her ostomy bag. She was noted to have therapeutic INR. This was reversed with vitamin K. She was seen by gastroenterology. Patient underwent colonoscopy which showed diverticulosis. No active bleeding was noted. Bleeding appears to have subsided. Warfarin was resumed. She hasn't had any further episodes of bleeding. However, hemoglobin noted to be lower yesterday. So she  stayed 1 more day. Hemoglobin stable this morning. She hasn't had any further episodes of bleeding. Okay for discharge.   Acute blood loss anemia Likely from GI source. Patient hemoglobin had been stable.   History of DVT/PE H/o multiple large PEs. Last episode was in 2014. Patient also has an IVC filter in place. Patient has never been tried on one of the direct oral anticoagulants. Discussed again with the patient. She chooses to continue with warfarin at this time. Warfarin was resumed. No need for any bridging treatment at this time. She has an appointment with the Coumadin clinic on Friday to check her INR.  Hypothyroidism Continue synthroid  Diabetes mellitus type 2 Continue home medications  History of essential hypertension  Blood pressure is stable. Continue home medication  History of hyperlipidemia  Continue statin  Overall, stable. Discussed in detail with patient and her daughter. Patient concerned that she hasn't had a proper bowel movement yet. She was reassured. She was told that she was kept nothing by mouth for 2 days and was given bowel prep for colonoscopy and so may not have had much in there to have a bowel movement. She was told that she may benefit from using stool softeners. She was told that she will likely have a bowel movement soon now that she is on a diet. Okay for discharge home today.   PERTINENT LABS:  The results of significant diagnostics from this hospitalization (including imaging, microbiology, ancillary and laboratory) are listed below for reference.      Labs: Basic Metabolic Panel:  Recent Labs Lab 03/10/16 0826 03/11/16 0938 03/12/16 0503 03/13/16 0455 03/14/16 0332  NA 142 140 140 139 140  K 3.9 3.7 3.6 3.9 3.9  CL 107 108 105  108 107  CO2 26 25 24 24 28   GLUCOSE 107* 124* 123* 95 112*  BUN 15 16 12 16 15   CREATININE 1.05* 0.99 0.91 0.96 1.02*  CALCIUM 8.9 8.9 8.7* 8.4* 8.7*   Liver Function Tests:  Recent Labs Lab  03/10/16 0826  AST 16  ALT 12*  ALKPHOS 44  BILITOT 0.5  PROT 5.4*  ALBUMIN 2.8*   CBC:  Recent Labs Lab 03/10/16 0826  03/11/16 1624 03/12/16 0503 03/13/16 0455 03/13/16 1435 03/14/16 0332  WBC 10.7*  < > 11.9* 9.5 7.3 8.4 7.7  NEUTROABS 6.4  --   --   --   --   --   --   HGB 10.9*  < > 11.4* 9.1* 8.2* 8.9* 8.4*  HCT 35.3*  < > 36.3 29.0* 26.7* 29.0* 26.9*  MCV 89.4  < > 90.1 87.9 89.0 89.8 88.8  PLT 231  < > 208 217 179 222 206  < > = values in this interval not displayed.  CBG:  Recent Labs Lab 03/13/16 1135 03/13/16 1716 03/13/16 2117 03/13/16 2200 03/14/16 0808  GLUCAP 117* 142* 198* 175* 103*     DISCHARGE EXAMINATION: Vitals:   03/13/16 1333 03/13/16 2119 03/13/16 2157 03/14/16 0515  BP: 96/82 (!) 121/44 (!) 131/51 139/62  Pulse: 60 78  66  Resp: 18 18  18   Temp: 98.9 F (37.2 C) 97.8 F (36.6 C)  98.1 F (36.7 C)  TempSrc: Oral Oral  Oral  SpO2: 100% 100%  100%  Weight:      Height:       General appearance: alert, cooperative, appears stated age and no distress Resp: clear to auscultation bilaterally Cardio: regular rate and rhythm, S1, S2 normal, no murmur, click, rub or gallop GI: Abdomen soft. Nontender. Nondistended. Yellow stool in the ostomy bag.  DISPOSITION: Home  Discharge Instructions    Call MD for:  difficulty breathing, headache or visual disturbances    Complete by:  As directed    Call MD for:  extreme fatigue    Complete by:  As directed    Call MD for:  persistant dizziness or light-headedness    Complete by:  As directed    Call MD for:  persistant nausea and vomiting    Complete by:  As directed    Call MD for:  severe uncontrolled pain    Complete by:  As directed    Call MD for:  temperature >100.4    Complete by:  As directed    Diet Carb Modified    Complete by:  As directed    Discharge instructions    Complete by:  As directed    Please start taking a stool softener daily. Please avoid constipation.  Increase fiber in your diet. Follow up with your PCP in 1 week for post hospital stay follow up. Go for your coumadin clinic appt on Friday (3/16) as previously scheduled. Watch carefully for recurrence of bleeding and seek attention immediately if you note any blood in the ostomy bag.  You were cared for by a hospitalist during your hospital stay. If you have any questions about your discharge medications or the care you received while you were in the hospital after you are discharged, you can call the unit and asked to speak with the hospitalist on call if the hospitalist that took care of you is not available. Once you are discharged, your primary care physician will handle any further medical issues. Please note that  NO REFILLS for any discharge medications will be authorized once you are discharged, as it is imperative that you return to your primary care physician (or establish a relationship with a primary care physician if you do not have one) for your aftercare needs so that they can reassess your need for medications and monitor your lab values. If you do not have a primary care physician, you can call [phone] for a physician referral.   Increase activity slowly    Complete by:  As directed       ALLERGIES:  Allergies  Allergen Reactions  . Augmentin [Amoxicillin-Pot Clavulanate] Other (See Comments)    Severe vaginal itching  . Tranxene [Clorazepate] Itching     Discharge Medication List as of 03/14/2016  9:37 AM    CONTINUE these medications which have NOT CHANGED   Details  acetaminophen (TYLENOL) 500 MG tablet Take 500 mg by mouth every 6 (six) hours as needed (pain)., Historical Med    HYDROcodone-acetaminophen (NORCO/VICODIN) 5-325 MG tablet Take 1 tablet by mouth every 6 (six) hours as needed., Starting Thu 12/17/2015, Print    levothyroxine (SYNTHROID, LEVOTHROID) 25 MCG tablet Take 1 tablet by mouth  daily before breakfast, Normal    metFORMIN (GLUCOPHAGE) 1000 MG tablet  1000mg  (1 tablet) with breakfast and 1000mg  (1 tablet) with dinner., Normal    metoprolol tartrate (LOPRESSOR) 25 MG tablet Take 1 tablet (25 mg total) by mouth 2 (two) times daily., Starting Fri 04/10/2015, Normal    simvastatin (ZOCOR) 20 MG tablet Take 1 tablet (20 mg total) by mouth at bedtime., Starting Fri 04/10/2015, Normal    vitamin C (ASCORBIC ACID) 500 MG tablet Take 500 mg by mouth daily. , Historical Med    warfarin (COUMADIN) 5 MG tablet Take as directed by Coumadin Clinic, Normal         Follow-up Information    KIM, Dahlia Client R., DO. Schedule an appointment as soon as possible for a visit in 1 week(s).   Specialty:  Family Medicine Contact information: [address] [place] [address] [phone]        Vertell Novak, MD Follow up.   Specialty:  Gastroenterology Why:  call as needed and if you note blood in stool Contact information: [address] [place] [address] [phone]           TOTAL DISCHARGE TIME: 35 mins  [facility]  [facility] Pager [phone]  03/14/2016, 2:01 PM

## 2016-03-14 NOTE — Discharge Instructions (Signed)
Diverticulosis  Diverticulosis is a condition that develops when small pouches (diverticula) form in the wall of the large intestine (colon). The colon is where water is absorbed and stool is formed. The pouches form when the inside layer of the colon pushes through weak spots in the outer layers of the colon. You may have a few pouches or many of them.  What are the causes?  The cause of this condition is not known.  What increases the risk?  The following factors may make you more likely to develop this condition:   Being older than age 60. Your risk for this condition increases with age. Diverticulosis is rare among people younger than age 30. By age 80, many people have it.   Eating a low-fiber diet.   Having frequent constipation.   Being overweight.   Not getting enough exercise.   Smoking.   Taking over-the-counter pain medicines, like aspirin and ibuprofen.   Having a family history of diverticulosis.    What are the signs or symptoms?  In most people, there are no symptoms of this condition. If you do have symptoms, they may include:   Bloating.   Cramps in the abdomen.   Constipation or diarrhea.   Pain in the lower left side of the abdomen.    How is this diagnosed?  This condition is most often diagnosed during an exam for other colon problems. Because diverticulosis usually has no symptoms, it often cannot be diagnosed independently. This condition may be diagnosed by:   Using a flexible scope to examine the colon (colonoscopy).   Taking an X-ray of the colon after dye has been put into the colon (barium enema).   Doing a CT scan.    How is this treated?  You may not need treatment for this condition if you have never developed an infection related to diverticulosis. If you have had an infection before, treatment may include:   Eating a high-fiber diet. This may include eating more fruits, vegetables, and grains.   Taking a fiber supplement.   Taking a live bacteria supplement  (probiotic).   Taking medicine to relax your colon.   Taking antibiotic medicines.    Follow these instructions at home:   Drink 6-8 glasses of water or more each day to prevent constipation.   Try not to strain when you have a bowel movement.   If you have had an infection before:  ? Eat more fiber as directed by your health care provider or your diet and nutrition specialist (dietitian).  ? Take a fiber supplement or probiotic, if your health care provider approves.   Take over-the-counter and prescription medicines only as told by your health care provider.   If you were prescribed an antibiotic, take it as told by your health care provider. Do not stop taking the antibiotic even if you start to feel better.   Keep all follow-up visits as told by your health care provider. This is important.  Contact a health care provider if:   You have pain in your abdomen.   You have bloating.   You have cramps.   You have not had a bowel movement in 3 days.  Get help right away if:   Your pain gets worse.   Your bloating becomes very bad.   You have a fever or chills, and your symptoms suddenly get worse.   You vomit.   You have bowel movements that are bloody or black.   You have   bleeding from your rectum.  Summary   Diverticulosis is a condition that develops when small pouches (diverticula) form in the wall of the large intestine (colon).   You may have a few pouches or many of them.   This condition is most often diagnosed during an exam for other colon problems.   If you have had an infection related to diverticulosis, treatment may include increasing the fiber in your diet, taking supplements, or taking medicines.  This information is not intended to replace advice given to you by your health care provider. Make sure you discuss any questions you have with your health care provider.  Document Released: 09/17/2003 Document Revised: 11/09/2015 Document Reviewed: 11/09/2015  Elsevier Interactive  Patient Education  2017 [employer]

## 2016-03-14 NOTE — Consult Note (Signed)
           [facility] CM Primary Care Navigator  03/14/2016  Hannah [name] 1940-05-21 [id number]   Went to see patient today at the bedside to identify possible discharge needs but she was just discharged home today per staff.  Primary care provider's office called Zenon Mayo) to notify of patient's discharge and need for post hospital follow-up and transition of care. Notified that patient had been monitored for bleeding and hemoglobin level at the hospital.  Made aware to refer patient to [facility] care management if deemed appropriate for services.   For questions, please contact:  Wyatt Haste, BSN, RN- [facility] Primary Care Navigator  Telephone: [phone] [facility]

## 2016-03-16 ENCOUNTER — Telehealth: Payer: Self-pay

## 2016-03-16 NOTE — Telephone Encounter (Signed)
D/C 03/14/16 To: home  Spoke with pt and she states that she is doing well. She reports that she is able to perform all ADL's with very minimal assistance from her daughters who live close by.   Appt not scheduled, she reports that she is due to see "a doctor from the hospital" next Wednesday, however she is not sure who that is and there is nothing in her Epic appointment desk. Pt states that she will ask her daughter and then have her call us to schedule an appt.    Transition Care Management Follow-up Telephone Call  How have you been since you were released from the hospital? good   Do you understand why you were in the hospital? yes   Do you understand the discharge instrcutions? yes  Items Reviewed:  Medications reviewed: yes  Allergies reviewed: yes  Dietary changes reviewed: yes  Referrals reviewed: yes   Functional Questionnaire:   Activities of Daily Living (ADLs):   She states they are independent in the following: ambulation, bathing and hygiene, feeding, continence, grooming, toileting and dressing States they require assistance with the following: none   Any transportation issues/concerns?: no   Any patient concerns? no   Confirmed importance and date/time of follow-up visits scheduled: yes   Confirmed with patient if condition begins to worsen call PCP or go to the ER.  Patient was given the Call-a-Nurse line [phone]: yes

## 2016-03-18 ENCOUNTER — Ambulatory Visit (INDEPENDENT_AMBULATORY_CARE_PROVIDER_SITE_OTHER): Payer: Medicare Other | Admitting: *Deleted

## 2016-03-18 DIAGNOSIS — Z5181 Encounter for therapeutic drug level monitoring: Secondary | ICD-10-CM | POA: Diagnosis not present

## 2016-03-18 DIAGNOSIS — I2699 Other pulmonary embolism without acute cor pulmonale: Secondary | ICD-10-CM | POA: Diagnosis not present

## 2016-03-18 DIAGNOSIS — Z7901 Long term (current) use of anticoagulants: Secondary | ICD-10-CM | POA: Diagnosis not present

## 2016-03-18 LAB — POCT INR: INR: 1.5

## 2016-03-23 DIAGNOSIS — D649 Anemia, unspecified: Secondary | ICD-10-CM | POA: Diagnosis not present

## 2016-03-23 DIAGNOSIS — R110 Nausea: Secondary | ICD-10-CM | POA: Diagnosis not present

## 2016-03-23 DIAGNOSIS — E1165 Type 2 diabetes mellitus with hyperglycemia: Secondary | ICD-10-CM | POA: Diagnosis not present

## 2016-03-23 DIAGNOSIS — I10 Essential (primary) hypertension: Secondary | ICD-10-CM | POA: Diagnosis not present

## 2016-03-24 ENCOUNTER — Ambulatory Visit (INDEPENDENT_AMBULATORY_CARE_PROVIDER_SITE_OTHER): Payer: Medicare Other | Admitting: *Deleted

## 2016-03-24 DIAGNOSIS — Z5181 Encounter for therapeutic drug level monitoring: Secondary | ICD-10-CM | POA: Diagnosis not present

## 2016-03-24 DIAGNOSIS — Z7901 Long term (current) use of anticoagulants: Secondary | ICD-10-CM

## 2016-03-24 DIAGNOSIS — I2699 Other pulmonary embolism without acute cor pulmonale: Secondary | ICD-10-CM

## 2016-03-24 LAB — POCT INR: INR: 2.8

## 2016-04-06 DIAGNOSIS — I10 Essential (primary) hypertension: Secondary | ICD-10-CM | POA: Diagnosis not present

## 2016-04-06 DIAGNOSIS — Z933 Colostomy status: Secondary | ICD-10-CM | POA: Diagnosis not present

## 2016-04-06 DIAGNOSIS — D649 Anemia, unspecified: Secondary | ICD-10-CM | POA: Diagnosis not present

## 2016-04-06 DIAGNOSIS — E119 Type 2 diabetes mellitus without complications: Secondary | ICD-10-CM | POA: Diagnosis not present

## 2016-04-11 ENCOUNTER — Ambulatory Visit: Payer: Medicare Other | Admitting: Family Medicine

## 2016-04-12 ENCOUNTER — Ambulatory Visit: Payer: Medicare Other

## 2016-04-14 ENCOUNTER — Ambulatory Visit (INDEPENDENT_AMBULATORY_CARE_PROVIDER_SITE_OTHER): Payer: Medicare Other | Admitting: *Deleted

## 2016-04-14 DIAGNOSIS — I2699 Other pulmonary embolism without acute cor pulmonale: Secondary | ICD-10-CM

## 2016-04-14 DIAGNOSIS — Z7901 Long term (current) use of anticoagulants: Secondary | ICD-10-CM

## 2016-04-14 DIAGNOSIS — Z5181 Encounter for therapeutic drug level monitoring: Secondary | ICD-10-CM

## 2016-04-14 LAB — POCT INR: INR: 2.8

## 2016-04-15 ENCOUNTER — Ambulatory Visit: Payer: Medicare Other | Admitting: Family Medicine

## 2016-04-19 ENCOUNTER — Other Ambulatory Visit: Payer: Self-pay | Admitting: *Deleted

## 2016-04-19 NOTE — Telephone Encounter (Signed)
I left a message for the pt to return my call. 

## 2016-04-19 NOTE — Telephone Encounter (Signed)
I do not see this on her medication list form last visit here not on recent hospital discharge list. Can you find out whom prescribed this for her? Or whom has been prescribing?

## 2016-05-05 ENCOUNTER — Other Ambulatory Visit: Payer: Self-pay | Admitting: Family Medicine

## 2016-05-06 NOTE — Telephone Encounter (Signed)
I left a message for the pt to return my call and a denial was sent to the pharmacy on the Rx.

## 2016-05-12 ENCOUNTER — Ambulatory Visit (INDEPENDENT_AMBULATORY_CARE_PROVIDER_SITE_OTHER): Payer: Medicare Other | Admitting: Pharmacist

## 2016-05-12 DIAGNOSIS — Z5181 Encounter for therapeutic drug level monitoring: Secondary | ICD-10-CM | POA: Diagnosis not present

## 2016-05-12 DIAGNOSIS — Z7901 Long term (current) use of anticoagulants: Secondary | ICD-10-CM | POA: Diagnosis not present

## 2016-05-12 DIAGNOSIS — I2699 Other pulmonary embolism without acute cor pulmonale: Secondary | ICD-10-CM | POA: Diagnosis not present

## 2016-05-12 LAB — POCT INR: INR: 4.2

## 2016-05-15 ENCOUNTER — Other Ambulatory Visit: Payer: Self-pay | Admitting: Family Medicine

## 2016-05-18 DIAGNOSIS — E1165 Type 2 diabetes mellitus with hyperglycemia: Secondary | ICD-10-CM | POA: Diagnosis not present

## 2016-05-18 DIAGNOSIS — Z933 Colostomy status: Secondary | ICD-10-CM | POA: Diagnosis not present

## 2016-05-18 DIAGNOSIS — R600 Localized edema: Secondary | ICD-10-CM | POA: Diagnosis not present

## 2016-05-18 DIAGNOSIS — I10 Essential (primary) hypertension: Secondary | ICD-10-CM | POA: Diagnosis not present

## 2016-06-02 ENCOUNTER — Ambulatory Visit (INDEPENDENT_AMBULATORY_CARE_PROVIDER_SITE_OTHER): Payer: Medicare Other | Admitting: *Deleted

## 2016-06-02 DIAGNOSIS — Z5181 Encounter for therapeutic drug level monitoring: Secondary | ICD-10-CM

## 2016-06-02 DIAGNOSIS — I2699 Other pulmonary embolism without acute cor pulmonale: Secondary | ICD-10-CM

## 2016-06-02 DIAGNOSIS — Z7901 Long term (current) use of anticoagulants: Secondary | ICD-10-CM

## 2016-06-02 LAB — POCT INR: INR: 4.3

## 2016-06-15 DIAGNOSIS — D649 Anemia, unspecified: Secondary | ICD-10-CM | POA: Diagnosis not present

## 2016-06-15 DIAGNOSIS — E1142 Type 2 diabetes mellitus with diabetic polyneuropathy: Secondary | ICD-10-CM | POA: Diagnosis not present

## 2016-06-15 DIAGNOSIS — E1165 Type 2 diabetes mellitus with hyperglycemia: Secondary | ICD-10-CM | POA: Diagnosis not present

## 2016-06-15 DIAGNOSIS — L84 Corns and callosities: Secondary | ICD-10-CM | POA: Diagnosis not present

## 2016-06-15 DIAGNOSIS — I10 Essential (primary) hypertension: Secondary | ICD-10-CM | POA: Diagnosis not present

## 2016-06-16 ENCOUNTER — Ambulatory Visit (INDEPENDENT_AMBULATORY_CARE_PROVIDER_SITE_OTHER): Payer: Medicare Other | Admitting: *Deleted

## 2016-06-16 DIAGNOSIS — D649 Anemia, unspecified: Secondary | ICD-10-CM | POA: Diagnosis not present

## 2016-06-16 DIAGNOSIS — Z933 Colostomy status: Secondary | ICD-10-CM | POA: Diagnosis not present

## 2016-06-16 DIAGNOSIS — Z7901 Long term (current) use of anticoagulants: Secondary | ICD-10-CM | POA: Diagnosis not present

## 2016-06-16 DIAGNOSIS — Z5181 Encounter for therapeutic drug level monitoring: Secondary | ICD-10-CM | POA: Diagnosis not present

## 2016-06-16 DIAGNOSIS — I10 Essential (primary) hypertension: Secondary | ICD-10-CM | POA: Diagnosis not present

## 2016-06-16 DIAGNOSIS — I2699 Other pulmonary embolism without acute cor pulmonale: Secondary | ICD-10-CM

## 2016-06-16 DIAGNOSIS — K56609 Unspecified intestinal obstruction, unspecified as to partial versus complete obstruction: Secondary | ICD-10-CM | POA: Diagnosis not present

## 2016-06-16 DIAGNOSIS — E039 Hypothyroidism, unspecified: Secondary | ICD-10-CM | POA: Diagnosis not present

## 2016-06-16 DIAGNOSIS — Z4801 Encounter for change or removal of surgical wound dressing: Secondary | ICD-10-CM | POA: Diagnosis not present

## 2016-06-16 DIAGNOSIS — E119 Type 2 diabetes mellitus without complications: Secondary | ICD-10-CM | POA: Diagnosis not present

## 2016-06-16 LAB — POCT INR: INR: 2

## 2016-07-05 ENCOUNTER — Other Ambulatory Visit: Payer: Self-pay | Admitting: Family Medicine

## 2016-07-05 DIAGNOSIS — E785 Hyperlipidemia, unspecified: Secondary | ICD-10-CM

## 2016-07-07 ENCOUNTER — Ambulatory Visit (INDEPENDENT_AMBULATORY_CARE_PROVIDER_SITE_OTHER): Payer: Medicare Other | Admitting: *Deleted

## 2016-07-07 DIAGNOSIS — Z5181 Encounter for therapeutic drug level monitoring: Secondary | ICD-10-CM

## 2016-07-07 DIAGNOSIS — Z7901 Long term (current) use of anticoagulants: Secondary | ICD-10-CM | POA: Diagnosis not present

## 2016-07-07 DIAGNOSIS — I2699 Other pulmonary embolism without acute cor pulmonale: Secondary | ICD-10-CM | POA: Diagnosis not present

## 2016-07-07 LAB — POCT INR: INR: 3.2

## 2016-07-25 NOTE — Progress Notes (Signed)
HPI:  Hannah [name] is a pleasant 76 y.o. here for follow up. Chronic medical problems summarized below were reviewed for changes. Not seen in some time. Has been seeing another primary care provider whom she established with at [facility] care center - was seen there recently. Reports during hospitalization earlier this year the hospitalist advised she follow up there, but the doctor was out of network.Sees cardiologist and cardiology coumadin clinic for her history of PE, cardiogenic shock, HTN, HLD on chornic anticoagulation. Denies CP, SOB, DOE, treatment intolerance or new symptoms.  Hypothyroidism:  -on synthroid chronically   DM w/ neuropathy:  -current meds:metformin 1000mg  bid;used to take glipizide  Hx PE and cardiogenic shock, HTN, HLD:  -followed by Dr. Tenny Craw in Cardiology  -on coumadin managed by cards coumadin clinic, statin, metoprolol   She lives alone, but has lifeline and family members whom check on her daily and help with meals. Daughter here today.  Knee and Hip OA s/p hip and knee replacements: -uses [employer] transportation -Saw Dr. Luiz Blare at [facility] orthopedics in the past -has long hx of severe pain in hips and knees when walks more then a few blocks or stands for long periods of time, has difficulty boarding a bus or walking ups hills -uses walker and cane to ambulate  ROS: See pertinent positives and negatives per HPI.  Past Medical History:  Diagnosis Date  . Acute massive pulmonary embolism (HCC) 07/13/2012   Massive PE w/ PEA arrest 07/13/12 >TNK >IVC filter >discharged on comadin    . Diabetes mellitus, type 2 (HCC) 07/15/2012   with peripheral neuropathy  . Hyperlipemia 07/14/2012  . Hypertension 07/14/2012  . Large bowel stricture (HCC)    s/p colectomy in 2016  . Osteoarthritis    s/p hip and knee replacements  . Thyroid disease     Past Surgical History:  Procedure Laterality Date  . COLONOSCOPY N/A 03/12/2016   Procedure:  COLONOSCOPY;  Surgeon: Dorena Cookey, MD;  Location: [facility] ENDOSCOPY;  Service: Endoscopy;  Laterality: N/A;  . COLOSTOMY N/A 06/03/2014   Procedure: COLOSTOMY;  Surgeon: Harriette Bouillon, MD;  Location: [facility] OR;  Service: General;  Laterality: N/A;  . FLEXIBLE SIGMOIDOSCOPY N/A 05/30/2014   Procedure: Arnell Sieving;  Surgeon: Jeani Hawking, MD;  Location: [facility] ENDOSCOPY;  Service: Endoscopy;  Laterality: N/A;  . INSERTION OF VENA CAVA FILTER N/A 07/16/2012   Procedure: INSERTION OF VENA CAVA FILTER;  Surgeon: [name], MD;  Location: [facility] CATH LAB;  Service: Cardiovascular;  Laterality: N/A;  . KNEE ARTHROSCOPY    . PARTIAL COLECTOMY N/A 06/03/2014   Procedure: PARTIAL COLECTOMY;  Surgeon: Harriette Bouillon, MD;  Location: [facility] OR;  Service: General;  Laterality: N/A;  . SP ARTHRO HIP*L*      No family history on file.  Social History   Social History  . Marital status: Widowed    Spouse name: N/A  . Number of children: N/A  . Years of education: N/A   Social History Main Topics  . Smoking status: Former Smoker    Packs/day: 1.00    Years: 10.00    Types: Cigarettes    Quit date: 01/04/1968  . Smokeless tobacco: Never Used  . Alcohol use No  . Drug use: No  . Sexual activity: Not Asked   Other Topics Concern  . None   Social History Narrative   Lives alone.            Current Outpatient Prescriptions:  .  acetaminophen (TYLENOL)  500 MG tablet, Take 500 mg by mouth every 6 (six) hours as needed (pain)., Disp: , Rfl:  .  HYDROcodone-acetaminophen (NORCO/VICODIN) 5-325 MG tablet, Take 1 tablet by mouth every 6 (six) hours as needed. (Patient taking differently: Take 1 tablet by mouth every 6 (six) hours as needed for moderate pain. ), Disp: 10 tablet, Rfl: 0 .  IRON PO, Take by mouth., Disp: , Rfl:  .  levothyroxine (SYNTHROID, LEVOTHROID) 25 MCG tablet, Take 1 tablet by mouth  daily before breakfast, Disp: 90 tablet, Rfl: 3 .  lisinopril (PRINIVIL,ZESTRIL) 40 MG tablet, Take 40  mg by mouth daily., Disp: , Rfl:  .  metFORMIN (GLUCOPHAGE) 1000 MG tablet, TAKE ONE TABLET BY MOUTH WITH BREAKFAST AND TAKE ONE TABLET WITH DINNER, Disp: 60 tablet, Rfl: 0 .  metoprolol tartrate (LOPRESSOR) 25 MG tablet, Take 1 tablet (25 mg total) by mouth 2 (two) times daily., Disp: 180 tablet, Rfl: 3 .  ONETOUCH DELICA LANCETS FINE MISC, USE ONCE DAILY, Disp: 100 each, Rfl: 3 .  simvastatin (ZOCOR) 20 MG tablet, Take 1 tablet (20 mg total) by mouth at bedtime., Disp: 90 tablet, Rfl: 3 .  vitamin C (ASCORBIC ACID) 500 MG tablet, Take 500 mg by mouth daily. , Disp: , Rfl:  .  warfarin (COUMADIN) 5 MG tablet, Take as directed by Coumadin Clinic (Patient taking differently: Take 2.5-5 mg by mouth See admin instructions. Take 2.5 mg on Tuesday and Friday ;  Take 5 mg on all other days), Disp: 90 tablet, Rfl: 1  EXAM:  Vitals:   07/26/16 1336  BP: (!) 142/82  Pulse: 63  Temp: 98.1 F (36.7 C)    Body mass index is 26.8 kg/m.  GENERAL: vitals reviewed and listed above, alert, oriented, appears well hydrated and in no acute distress  HEENT: atraumatic, conjunttiva clear, no obvious abnormalities on inspection of external nose and ears  NECK: no obvious masses on inspection  LUNGS: clear to auscultation bilaterally, no wheezes, rales or rhonchi, good air movement  CV: HRRR, no peripheral edema  MS: moves all extremities without noticeable abnormality  PSYCH: pleasant and cooperative, no obvious depression or anxiety  ASSESSMENT AND PLAN:  Discussed the following assessment and plan:  Controlled type 2 diabetes mellitus with diabetic neuropathy, without long-term current use of insulin (HCC) - Plan: Hemoglobin A1c, Ambulatory referral to Podiatry  Callus of foot - Plan: Ambulatory referral to Podiatry  Toenail deformity - Plan: Ambulatory referral to Podiatry  Hypertension associated with diabetes (HCC) - Plan: Basic metabolic panel, CBC  Hyperlipidemia associated with type 2  diabetes mellitus (HCC) - Plan: simvastatin (ZOCOR) 20 MG tablet  Other specified hypothyroidism - Plan: TSH  Long term current use of anticoagulant therapy  Hypercoagulopathy (HCC)  Pulmonary hypertension (HCC), Chronic  -recheck BP seated and follow up 3 months -labs today -diabetic foot exam today -refills per requests -AWV and follow up 3 months -Patient advised to return or notify a doctor immediately if symptoms worsen or persist or new concerns arise.  Patient Instructions  BEFORE YOU LEAVE: -recheck blood pressure -schedule follow up: AWV with susan and follow up with Dr. [name] on the same day in 3 months; fasting appointment if convenient for patient -labs  We have ordered labs or studies at this visit. It can take up to 1-2 weeks for results and processing. IF results require follow up or explanation, we will call you with instructions. Clinically stable results will be released to your [facility]. If you have  not heard from [place] or cannot find your results in [facility] in 2 weeks please contact our office at [phone].  If you are not yet signed up for [facility], please consider signing up.  -We placed a referral for you as discussed to podiatry for your feet. It usually takes about 1-2 weeks to process and schedule this referral. If you have not heard from [place] regarding this appointment in 2 weeks please contact our office.   Welcome back!         Kriste Basque R., DO

## 2016-07-26 ENCOUNTER — Ambulatory Visit (INDEPENDENT_AMBULATORY_CARE_PROVIDER_SITE_OTHER): Payer: Medicare Other | Admitting: Family Medicine

## 2016-07-26 ENCOUNTER — Encounter: Payer: Self-pay | Admitting: Family Medicine

## 2016-07-26 VITALS — BP 142/82 | HR 63 | Temp 98.1°F | Ht 67.0 in | Wt 171.1 lb

## 2016-07-26 DIAGNOSIS — L608 Other nail disorders: Secondary | ICD-10-CM

## 2016-07-26 DIAGNOSIS — I10 Essential (primary) hypertension: Secondary | ICD-10-CM | POA: Diagnosis not present

## 2016-07-26 DIAGNOSIS — E785 Hyperlipidemia, unspecified: Secondary | ICD-10-CM

## 2016-07-26 DIAGNOSIS — Z7901 Long term (current) use of anticoagulants: Secondary | ICD-10-CM | POA: Diagnosis not present

## 2016-07-26 DIAGNOSIS — I272 Pulmonary hypertension, unspecified: Secondary | ICD-10-CM

## 2016-07-26 DIAGNOSIS — E1159 Type 2 diabetes mellitus with other circulatory complications: Secondary | ICD-10-CM | POA: Diagnosis not present

## 2016-07-26 DIAGNOSIS — D6859 Other primary thrombophilia: Secondary | ICD-10-CM

## 2016-07-26 DIAGNOSIS — L84 Corns and callosities: Secondary | ICD-10-CM

## 2016-07-26 DIAGNOSIS — E038 Other specified hypothyroidism: Secondary | ICD-10-CM

## 2016-07-26 DIAGNOSIS — E1140 Type 2 diabetes mellitus with diabetic neuropathy, unspecified: Secondary | ICD-10-CM | POA: Diagnosis not present

## 2016-07-26 DIAGNOSIS — I152 Hypertension secondary to endocrine disorders: Secondary | ICD-10-CM

## 2016-07-26 DIAGNOSIS — E1169 Type 2 diabetes mellitus with other specified complication: Secondary | ICD-10-CM | POA: Diagnosis not present

## 2016-07-26 HISTORY — DX: Pulmonary hypertension, unspecified: I27.20

## 2016-07-26 MED ORDER — LEVOTHYROXINE SODIUM 25 MCG PO TABS: ORAL_TABLET | ORAL | 3 refills | Status: DC

## 2016-07-26 MED ORDER — SIMVASTATIN 20 MG PO TABS
20.0000 mg | ORAL_TABLET | Freq: Every day | ORAL | 3 refills | Status: DC
Start: 2016-07-26 — End: 2016-08-08

## 2016-07-26 NOTE — Patient Instructions (Addendum)
BEFORE YOU LEAVE: -recheck blood pressure -schedule follow up: AWV with susan and follow up with Dr. [name] on the same day in 3 months; fasting appointment if convenient for patient -labs  We have ordered labs or studies at this visit. It can take up to 1-2 weeks for results and processing. IF results require follow up or explanation, we will call you with instructions. Clinically stable results will be released to your [facility]. If you have not heard from [place] or cannot find your results in [facility] in 2 weeks please contact our office at [phone].  If you are not yet signed up for [facility], please consider signing up.  -We placed a referral for you as discussed to podiatry for your feet. It usually takes about 1-2 weeks to process and schedule this referral. If you have not heard from [place] regarding this appointment in 2 weeks please contact our office.   Welcome back!

## 2016-07-29 ENCOUNTER — Other Ambulatory Visit: Payer: Self-pay | Admitting: *Deleted

## 2016-07-29 MED ORDER — LEVOTHYROXINE SODIUM 25 MCG PO TABS: ORAL_TABLET | ORAL | 3 refills | Status: DC

## 2016-07-29 MED ORDER — METFORMIN HCL 1000 MG PO TABS: ORAL_TABLET | ORAL | 1 refills | Status: DC

## 2016-07-29 MED ORDER — METOPROLOL TARTRATE 25 MG PO TABS: 25.0000 mg | ORAL_TABLET | Freq: Two times a day (BID) | ORAL | 1 refills | Status: DC

## 2016-07-29 MED ORDER — LISINOPRIL 40 MG PO TABS: 40.0000 mg | ORAL_TABLET | Freq: Every day | ORAL | 3 refills | Status: DC

## 2016-07-29 NOTE — Telephone Encounter (Signed)
Rx done. 

## 2016-08-02 ENCOUNTER — Telehealth: Payer: Self-pay | Admitting: *Deleted

## 2016-08-02 NOTE — Telephone Encounter (Signed)
I called the pt and informed her of the message below and she stated she thought she was to have the labs done at the same time she is seeing Darl Pikes for the Wellness visit.  Per the pts request, I called her daughter [name] and left a detailed message asking that she call to schedule a lab appt for the pt as she needed to have the labs done at her last office visit.

## 2016-08-02 NOTE — Telephone Encounter (Signed)
-----   Message from [name], DO sent at 08/02/2016  8:10 AM EDT ----- She was supposed to have labs the day she was here. It looks like she may not have gone to the lab before leaving. Can you check to see if she would like to come in for a lab appointment for these labs? If so, please schedule lab visit an ----- Message ----- From: SYSTEM Sent: 07/31/2016  12:06 AM To: [name], DO

## 2016-08-04 ENCOUNTER — Other Ambulatory Visit: Payer: Self-pay | Admitting: *Deleted

## 2016-08-04 ENCOUNTER — Other Ambulatory Visit (INDEPENDENT_AMBULATORY_CARE_PROVIDER_SITE_OTHER): Payer: Medicare Other

## 2016-08-04 ENCOUNTER — Ambulatory Visit (INDEPENDENT_AMBULATORY_CARE_PROVIDER_SITE_OTHER): Payer: Medicare Other | Admitting: *Deleted

## 2016-08-04 DIAGNOSIS — I152 Hypertension secondary to endocrine disorders: Secondary | ICD-10-CM

## 2016-08-04 DIAGNOSIS — E1140 Type 2 diabetes mellitus with diabetic neuropathy, unspecified: Secondary | ICD-10-CM

## 2016-08-04 DIAGNOSIS — E1159 Type 2 diabetes mellitus with other circulatory complications: Secondary | ICD-10-CM | POA: Diagnosis not present

## 2016-08-04 DIAGNOSIS — Z7901 Long term (current) use of anticoagulants: Secondary | ICD-10-CM

## 2016-08-04 DIAGNOSIS — E118 Type 2 diabetes mellitus with unspecified complications: Secondary | ICD-10-CM

## 2016-08-04 DIAGNOSIS — E038 Other specified hypothyroidism: Secondary | ICD-10-CM

## 2016-08-04 DIAGNOSIS — I2699 Other pulmonary embolism without acute cor pulmonale: Secondary | ICD-10-CM | POA: Diagnosis not present

## 2016-08-04 DIAGNOSIS — I10 Essential (primary) hypertension: Secondary | ICD-10-CM | POA: Diagnosis not present

## 2016-08-04 DIAGNOSIS — Z5181 Encounter for therapeutic drug level monitoring: Secondary | ICD-10-CM | POA: Diagnosis not present

## 2016-08-04 LAB — CBC WITH DIFFERENTIAL/PLATELET
BASOS PCT: 0.5 % (ref 0.0–3.0)
Basophils Absolute: 0 10*3/uL (ref 0.0–0.1)
EOS PCT: 1.5 % (ref 0.0–5.0)
Eosinophils Absolute: 0.1 10*3/uL (ref 0.0–0.7)
HCT: 39.9 % (ref 36.0–46.0)
Hemoglobin: 12.4 g/dL (ref 12.0–15.0)
Lymphocytes Relative: 39.9 % (ref 12.0–46.0)
Lymphs Abs: 3.6 10*3/uL (ref 0.7–4.0)
MCHC: 31.1 g/dL (ref 30.0–36.0)
MCV: 84.4 fl (ref 78.0–100.0)
MONO ABS: 0.5 10*3/uL (ref 0.1–1.0)
MONOS PCT: 5.7 % (ref 3.0–12.0)
NEUTROS PCT: 52.4 % (ref 43.0–77.0)
Neutro Abs: 4.8 10*3/uL (ref 1.4–7.7)
Platelets: 278 10*3/uL (ref 150.0–400.0)
RBC: 4.72 Mil/uL (ref 3.87–5.11)
RDW: 21.3 % — AB (ref 11.5–15.5)
WBC: 9.1 10*3/uL (ref 4.0–10.5)

## 2016-08-04 LAB — BASIC METABOLIC PANEL
BUN: 19 mg/dL (ref 6–23)
CO2: 26 meq/L (ref 19–32)
Calcium: 9.6 mg/dL (ref 8.4–10.5)
Chloride: 104 mEq/L (ref 96–112)
Creatinine, Ser: 0.98 mg/dL (ref 0.40–1.20)
GFR: 71.11 mL/min (ref >60.00)
GLUCOSE: 92 mg/dL (ref 70–99)
Potassium: 4.4 mEq/L (ref 3.5–5.1)
SODIUM: 139 meq/L (ref 135–145)

## 2016-08-04 LAB — POCT INR: INR: 3.1

## 2016-08-04 LAB — HEMOGLOBIN A1C: Hgb A1c MFr Bld: 6.7 % — ABNORMAL HIGH (ref 4.6–6.5)

## 2016-08-04 LAB — TSH: TSH: 1.38 u[IU]/mL (ref 0.35–4.50)

## 2016-08-08 ENCOUNTER — Telehealth: Payer: Self-pay | Admitting: Family Medicine

## 2016-08-08 DIAGNOSIS — E1169 Type 2 diabetes mellitus with other specified complication: Secondary | ICD-10-CM

## 2016-08-08 DIAGNOSIS — E785 Hyperlipidemia, unspecified: Principal | ICD-10-CM

## 2016-08-08 MED ORDER — SIMVASTATIN 20 MG PO TABS: 20.0000 mg | ORAL_TABLET | Freq: Every day | ORAL | 3 refills | Status: DC

## 2016-08-08 NOTE — Telephone Encounter (Signed)
Rx done. 

## 2016-08-08 NOTE — Telephone Encounter (Signed)
°  Pt does not use [facility] refill was sent to [facility] back in July and should have beek sent to [facility]      Pt request refill of the following:  simvastatin (ZOCOR) 20 MG tablet   Phamacy: [facility]

## 2016-08-30 ENCOUNTER — Ambulatory Visit (INDEPENDENT_AMBULATORY_CARE_PROVIDER_SITE_OTHER): Payer: Medicare Other | Admitting: Sports Medicine

## 2016-08-30 ENCOUNTER — Encounter: Payer: Self-pay | Admitting: Sports Medicine

## 2016-08-30 DIAGNOSIS — R0989 Other specified symptoms and signs involving the circulatory and respiratory systems: Secondary | ICD-10-CM | POA: Diagnosis not present

## 2016-08-30 DIAGNOSIS — L84 Corns and callosities: Secondary | ICD-10-CM | POA: Diagnosis not present

## 2016-08-30 DIAGNOSIS — E1142 Type 2 diabetes mellitus with diabetic polyneuropathy: Secondary | ICD-10-CM

## 2016-08-30 DIAGNOSIS — B351 Tinea unguium: Secondary | ICD-10-CM | POA: Diagnosis not present

## 2016-08-30 DIAGNOSIS — M79676 Pain in unspecified toe(s): Secondary | ICD-10-CM | POA: Diagnosis not present

## 2016-08-30 NOTE — Progress Notes (Signed)
Patient ID: Jezebelle Fosdick, female   DOB: 02/18/1940, 76 y.o.   MRN: [id number]   Subjective: Dawnna Kidd is a 76 y.o. female patient with history of diabetes who returns to office today complaining of callus and long, painful nails while ambulating in shoes; unable to trim. Patient states that the glucose reading this morning was 100 mg/dl. Patient denies any new changes in medication or new problems; continues on Coumadin admits that months ago had a bleed around her colostomy bag and stayed in hospital for 5 days. Patient denies any new issues.  A1c 6.7. Saw PCP earlier this month.    Patient Active Problem List   Diagnosis Date Noted  . Pulmonary hypertension (HCC) 07/26/2016  . GI bleed 03/10/2016  . History of DVT in adulthood 03/10/2016  . Acute blood loss anemia 03/10/2016  . Gastrointestinal hemorrhage   . Diabetes mellitus with complication (HCC)   . Encounter for therapeutic drug monitoring 05/04/2015  . Pulmonary embolism (HCC) 02/12/2015  . Neuropathy 02/12/2015  . Osteoarthritis 08/12/2014  . Normocytic anemia 06/08/2014  . Hypothyroid 06/06/2013  . Diabetes mellitus with neurological manifestations, controlled (HCC) 06/06/2013  . Long term current use of anticoagulant therapy 07/27/2012   Current Outpatient Prescriptions on File Prior to Visit  Medication Sig Dispense Refill  . acetaminophen (TYLENOL) 500 MG tablet Take 500 mg by mouth every 6 (six) hours as needed (pain).    . IRON PO Take by mouth.    . levothyroxine (SYNTHROID, LEVOTHROID) 25 MCG tablet Take 1 tablet by mouth  daily before breakfast 90 tablet 3  . lisinopril (PRINIVIL,ZESTRIL) 40 MG tablet Take 1 tablet (40 mg total) by mouth daily. 90 tablet 3  . metFORMIN (GLUCOPHAGE) 1000 MG tablet TAKE ONE TABLET BY MOUTH WITH BREAKFAST AND TAKE ONE TABLET WITH DINNER 180 tablet 1  . metoprolol tartrate (LOPRESSOR) 25 MG tablet Take 1 tablet (25 mg total) by mouth 2 (two) times daily. 180 tablet 1  . ONETOUCH DELICA  LANCETS FINE MISC USE ONCE DAILY 100 each 3  . simvastatin (ZOCOR) 20 MG tablet Take 1 tablet (20 mg total) by mouth at bedtime. 90 tablet 3  . vitamin C (ASCORBIC ACID) 500 MG tablet Take 500 mg by mouth daily.     . warfarin (COUMADIN) 5 MG tablet Take as directed by Coumadin Clinic (Patient taking differently: Take 2.5-5 mg by mouth See admin instructions. Take 2.5 mg on Tuesday and Friday ;  Take 5 mg on all other days) 90 tablet 1   No current facility-administered medications on file prior to visit.    Allergies  Allergen Reactions  . Augmentin [Amoxicillin-Pot Clavulanate] Other (See Comments)    Severe vaginal itching  . Tranxene [Clorazepate] Itching    Recent Results (from the past 2160 hour(s))  POCT INR     Status: None   Collection Time: 06/02/16  1:36 PM  Result Value Ref Range   INR 4.3   POCT INR     Status: None   Collection Time: 06/16/16  2:47 PM  Result Value Ref Range   INR 2.0   POCT INR     Status: None   Collection Time: 07/07/16 12:01 PM  Result Value Ref Range   INR 3.2   POCT INR     Status: None   Collection Time: 08/04/16  1:47 PM  Result Value Ref Range   INR 3.1   TSH     Status: None   Collection Time: 08/04/16  2:13   PM  Result Value Ref Range   TSH 1.38 0.35 - 4.50 uIU/mL  CBC with Differential/Platelet     Status: Abnormal   Collection Time: 08/04/16  2:13 PM  Result Value Ref Range   WBC 9.1 4.0 - 10.5 K/uL   RBC 4.72 3.87 - 5.11 Mil/uL   Hemoglobin 12.4 12.0 - 15.0 g/dL   HCT 39.9 36.0 - 46.0 %   MCV 84.4 78.0 - 100.0 fl   MCHC 31.1 30.0 - 36.0 g/dL   RDW 21.3 (H) 11.5 - 15.5 %   Platelets 278.0 150.0 - 400.0 K/uL   Neutrophils Relative % 52.4 43.0 - 77.0 %   Lymphocytes Relative 39.9 12.0 - 46.0 %   Monocytes Relative 5.7 3.0 - 12.0 %   Eosinophils Relative 1.5 0.0 - 5.0 %   Basophils Relative 0.5 0.0 - 3.0 %   Neutro Abs 4.8 1.4 - 7.7 K/uL   Lymphs Abs 3.6 0.7 - 4.0 K/uL   Monocytes Absolute 0.5 0.1 - 1.0 K/uL   Eosinophils  Absolute 0.1 0.0 - 0.7 K/uL   Basophils Absolute 0.0 0.0 - 0.1 K/uL  Basic Metabolic Panel     Status: None   Collection Time: 08/04/16  2:13 PM  Result Value Ref Range   Sodium 139 135 - 145 mEq/L   Potassium 4.4 3.5 - 5.1 mEq/L   Chloride 104 96 - 112 mEq/L   CO2 26 19 - 32 mEq/L   Glucose, Bld 92 70 - 99 mg/dL   BUN 19 6 - 23 mg/dL   Creatinine, Ser 0.98 0.40 - 1.20 mg/dL   Calcium 9.6 8.4 - 10.5 mg/dL   GFR 71.11 >60.00 mL/min  Hemoglobin A1c     Status: Abnormal   Collection Time: 08/04/16  2:13 PM  Result Value Ref Range   Hgb A1c MFr Bld 6.7 (H) 4.6 - 6.5 %    Comment: Glycemic Control Guidelines for People with Diabetes:Non Diabetic:  <6%Goal of Therapy: <7%Additional Action Suggested:  >8%     Objective: General: Patient is awake, alert, and oriented x 3 and in no acute distress.  Integument: Skin is warm, dry and supple bilateral. Nails are tender, long, thickened and dystrophic with subungual debris, consistent with onychomycosis, 1-5 bilateral. No signs of infection. No open lesions or preulcerative lesions present bilateral. Previous bunion and hammertoe correction with old scars bilateral. + callus sub met 3 on right and very minimal at medial hallux on left. Remaining integument unremarkable.  Vasculature:  Dorsalis Pedis pulse 1/4 bilateral. Posterior Tibial pulse  1/4 bilateral. Capillary fill time <3 sec 1-5 bilateral. No hair growth to the level of the digits. Temperature gradient within normal limits. No varicosities present bilateral. No edema present bilateral.   Neurology: The patient has intact sensation measured with a 5.07/10g Semmes Weinstein Monofilament at all pedal sites bilateral . Vibratory sensation diminished bilateral with tuning fork. No Babinski sign present bilateral.   Musculoskeletal: No gross pedal deformities noted bilateral. Muscular strength 5/5 in all lower extremity muscular groups bilateral without pain on range of motion . No tenderness  with calf compression bilateral.  Assessment and Plan: Problem List Items Addressed This Visit    None    Visit Diagnoses    Dermatophytosis of nail    -  Primary   Pain of toe, unspecified laterality       Callus of foot       Decreased pedal pulses       Diabetic polyneuropathy associated   with type 2 diabetes mellitus ([place])         -Examined patient. -Discussed and educated patient on diabetic foot care, especially with  regards to the vascular, neurological and musculoskeletal systems.  -Stressed the importance of good glycemic control and the detriment of not  controlling glucose levels in relation to the foot. -Mechanically debrided callus x 1 on right using sterile chisel blade and all nails 1-5 bilateral using sterile nail nipper and filed with dremel without incident  -Answered all patient questions -Patient to return  in 3 months for at risk foot care -Patient advised to call the office if any problems or questions arise in the Meantime.  Landis Martins, DPM

## 2016-09-01 ENCOUNTER — Ambulatory Visit (INDEPENDENT_AMBULATORY_CARE_PROVIDER_SITE_OTHER): Payer: Medicare Other | Admitting: *Deleted

## 2016-09-01 DIAGNOSIS — Z5181 Encounter for therapeutic drug level monitoring: Secondary | ICD-10-CM | POA: Diagnosis not present

## 2016-09-01 DIAGNOSIS — Z7901 Long term (current) use of anticoagulants: Secondary | ICD-10-CM | POA: Diagnosis not present

## 2016-09-01 DIAGNOSIS — I2699 Other pulmonary embolism without acute cor pulmonale: Secondary | ICD-10-CM | POA: Diagnosis not present

## 2016-09-01 LAB — POCT INR: INR: 3.3

## 2016-09-22 ENCOUNTER — Encounter: Payer: Self-pay | Admitting: Family Medicine

## 2016-10-03 ENCOUNTER — Other Ambulatory Visit: Payer: Self-pay | Admitting: *Deleted

## 2016-10-03 ENCOUNTER — Other Ambulatory Visit: Payer: Self-pay | Admitting: Family Medicine

## 2016-10-03 DIAGNOSIS — Z1231 Encounter for screening mammogram for malignant neoplasm of breast: Secondary | ICD-10-CM

## 2016-10-03 MED ORDER — WARFARIN SODIUM 5 MG PO TABS: ORAL_TABLET | ORAL | 0 refills | Status: DC

## 2016-10-13 ENCOUNTER — Ambulatory Visit (INDEPENDENT_AMBULATORY_CARE_PROVIDER_SITE_OTHER): Payer: Medicare Other | Admitting: *Deleted

## 2016-10-13 DIAGNOSIS — I2699 Other pulmonary embolism without acute cor pulmonale: Secondary | ICD-10-CM

## 2016-10-13 DIAGNOSIS — Z5181 Encounter for therapeutic drug level monitoring: Secondary | ICD-10-CM | POA: Diagnosis not present

## 2016-10-13 DIAGNOSIS — Z7901 Long term (current) use of anticoagulants: Secondary | ICD-10-CM | POA: Diagnosis not present

## 2016-10-13 LAB — POCT INR: INR: 3.5

## 2016-10-20 ENCOUNTER — Telehealth: Payer: Self-pay | Admitting: Family Medicine

## 2016-10-20 MED ORDER — LISINOPRIL 40 MG PO TABS: 40.0000 mg | ORAL_TABLET | Freq: Every day | ORAL | 1 refills | Status: DC

## 2016-10-20 NOTE — Telephone Encounter (Signed)
Rx done. 

## 2016-10-20 NOTE — Telephone Encounter (Signed)
° ° ° ° °  Pt request refill of the following:  lisinopril (PRINIVIL,ZESTRIL) 40 MG tablet   Pt said she contacted the pharmacy and they told her she did not have any refills    Phamacy:  [facility] [place]

## 2016-10-26 ENCOUNTER — Ambulatory Visit
Admission: RE | Admit: 2016-10-26 | Discharge: 2016-10-26 | Disposition: A | Discharge status: Home / Self Care | Payer: Medicare Other | Source: Ambulatory Visit | Attending: Family Medicine | Admitting: Family Medicine

## 2016-10-26 DIAGNOSIS — Z1231 Encounter for screening mammogram for malignant neoplasm of breast: Secondary | ICD-10-CM

## 2016-10-27 NOTE — Progress Notes (Signed)
HPI:  Hannah [name] is a pleasant 76 y.o. here for follow up. Chronic medical problems summarized below were reviewed for changes. Denies CP, SOB, DOE, treatment intolerance or new symptoms. Did labs in August. Due for flu shot. due for eye exam. Requests refills on several medications. Did cholesterol panel in December. Seeing [name] for wellness visit at 10:00.  Hypothyroidism:  -on synthroid chronically   DM w/ neuropathy:  -current meds:metformin 1024m bid;used to take glipizide  Hx PE and cardiogenic shock, HTN, HLD:  -followed by Dr. RHarrington Challengerin Cardiology  -on coumadin managed by [facility], statin, metoprolol   She lives alone, but has lifeline and family members whom check on her daily and help with meals. Daughter here today.  Knee and Hip OA s/p hip and knee replacements: -uses [employer] transportation -Saw Dr. [place]Berenice [place]at [place] the past -has long hx of severe pain in hips and knees when walks more then a few blocks or stands for long periods of time, has difficulty boarding a bus or walking ups hills -uses walker and cane to ambulate  ROS: See pertinent positives and negatives per HPI.  Past Medical History:  Diagnosis Date  . [place]   Massive PE w/ PEA arrest 07/13/12 >TNK >IVC filter >discharged on comadin    . [place] type 2 [place] 07/15/2012   with peripheral neuropathy  . Hyperlipemia 07/14/2012  . Hypertension 07/14/2012  . Large bowel stricture (HCC)    s/p colectomy in 2016  . Osteoarthritis    s/p hip and knee replacements  . Thyroid disease     Past Surgical History:  Procedure Laterality Date  . COLONOSCOPY N/A 03/12/2016   Procedure: COLONOSCOPY;  Surgeon: [name];  [facility];  Service: Endoscopy;  Laterality: N/A;  . COLOSTOMY N/A 06/03/2014   Procedure: COLOSTOMY;  Surgeon: [name];  [place] [facility]  Service: General;  Laterality: N/A;  .  FLEXIBLE SIGMOIDOSCOPY N/A 05/30/2014   Procedure: FBeryle Quant  Surgeon: [name];  [facility];  Service: Endoscopy;  Laterality: N/A;  . INSERTION OF VENA CAVA FILTER N/A 07/16/2012   Procedure: INSERTION OF VENA CAVA FILTER;  Surgeon: [name];  [facility];  Service: Cardiovascular;  Laterality: N/A;  . KNEE ARTHROSCOPY    . PARTIAL COLECTOMY N/A 06/03/2014   Procedure: PARTIAL COLECTOMY;  Surgeon: [name];  [place] [facility]  Service: General;  Laterality: N/A;  . REDUCTION MAMMAPLASTY Bilateral   . SP ARTHRO HIP*L*      Family History  Problem Relation Age of Onset  . Breast cancer Mother     Social History   Social History  . Marital status: Widowed    Spouse name: N/A  . Number of children: N/A  . Years of education: N/A   Social History Main Topics  . Smoking status: Former Smoker    Packs/day: 1.00    Years: 10.00    Types: Cigarettes    Quit date: 01/04/1968  . Smokeless tobacco: Never Used  . Alcohol use No  . Drug use: No  . Sexual activity: Not Asked   Other Topics Concern  . None   Social History Narrative   Lives alone.            Current Outpatient Prescriptions:  .  acetaminophen (TYLENOL) 500 MG tablet, Take 500 mg by mouth every 6 (six) hours as needed (pain)., Disp: , Rfl:  .  lisinopril (PRINIVIL,ZESTRIL) 40 MG tablet, Take 1 tablet (40 mg total) by mouth daily., Disp: 90 tablet, Rfl: 1 .  metFORMIN (GLUCOPHAGE) 1000 MG tablet, TAKE ONE TABLET BY MOUTH WITH BREAKFAST AND TAKE ONE TABLET WITH DINNER, Disp: 180 tablet, Rfl: 1 .  ONETOUCH DELICA LANCETS FINE MISC, USE ONCE DAILY, Disp: 100 each, Rfl: 3 .  simvastatin (ZOCOR) 20 MG tablet, Take 1 tablet (20 mg total) by mouth at bedtime., Disp: 90 tablet, Rfl: 3 .  vitamin C (ASCORBIC ACID) 500 MG tablet, Take 500 mg by mouth daily. , Disp: , Rfl:  .  warfarin (COUMADIN) 5 MG tablet, Take as directed by Coumadin Clinic, Disp: 90 tablet,  Rfl: 0 .  levothyroxine (SYNTHROID, LEVOTHROID) 25 MCG tablet, Take 1 tablet by mouth  daily before breakfast, Disp: 90 tablet, Rfl: 1 .  metoprolol tartrate (LOPRESSOR) 25 MG tablet, Take 1 tablet (25 mg total) by mouth 2 (two) times daily., Disp: 180 tablet, Rfl: 1  EXAM:  Vitals:   10/28/16 0844  BP: 138/82  Pulse: 65  Temp: 97.6 F (36.4 C)    Body mass index is 26.25 kg/m.  GENERAL: vitals reviewed and listed above, alert, oriented, appears well hydrated and in no acute distress  HEENT: atraumatic, conjunttiva clear, no obvious abnormalities on inspection of external nose and ears  NECK: no obvious masses on inspection  LUNGS: clear to auscultation bilaterally, no wheezes, rales or rhonchi, good air movement  CV: HRRR, no peripheral edema  MS: moves all extremities without noticeable abnormality, using cane  PSYCH: pleasant and cooperative, no obvious depression or anxiety  ASSESSMENT AND PLAN:  Discussed the following assessment and plan:  Hypothyroidism, unspecified type  Type 2 diabetes mellitus with diabetic neuropathy, without long-term current use of insulin (HCC)  Hypertension associated with diabetes ([place])  Hyperlipidemia associated with type 2 diabetes mellitus ([place])  Hypercoagulable state ([place])  -refills as needed -lifestyle recs -preventive care visit with [name] -opted to do labs next visit as none due today -Patient advised to return or notify a doctor immediately if symptoms worsen or persist or new concerns arise.  Patient Instructions   BEFORE YOU LEAVE: -refills -AWV with susan -follow up: 4 months    Ms. Randon , Thank you for taking time to come for your Medicare Wellness Visit. I appreciate your ongoing commitment to your health goals. Please review the following plan we discussed and let me know if I can assist you in the future.   Will have dexa scan next year when you have your mammogram  [facility]   Planned to  schedule eye exam with dtr  Also ask [facility] to send [place] a report   You have taken the Zoster for shingles in July 2017 This year, in 2018 there is a new shingles vaccine called the shingrix  Shingrix is a vaccine for the prevention of Shingles in Adults 50 and older.  If you are on Medicare, you can request a prescription from your doctor to be filled at a pharmacy.  Please check with your benefits regarding applicable copays or out of pocket expenses.  The Shingrix is given in 2 vaccines approx 8 weeks apart. You must receive the 2nd dose prior to 6 months from receipt of the first.     These are the goals we discussed: Goals    . patient          To maintain exercise at the [employer]!  This is a list of the screening recommended for you and due dates:  Health Maintenance  Topic Date Due  . Eye exam for diabetics  06/22/2016  . Hemoglobin A1C  02/04/2017  . Complete foot exam   07/26/2017  . Tetanus Vaccine  04/06/2024  . Flu Shot  Completed  . DEXA scan (bone density measurement)  Addressed  . Pneumonia vaccines  Completed    Prevention of falls: Remove rugs or any tripping hazards in the home Use Non slip mats in bathtubs and showers Placing grab bars next to the toilet and or shower Placing handrails on both sides of the stair way Adding extra lighting in the home.   Personal safety issues reviewed:  1. Consider starting a community watch program per [facility] 2.  Changes batteries is smoke detector and/or carbon monoxide detector  3.  If you have firearms; keep them in a safe place 4.  Wear protection when in the sun; Always wear sunscreen or a hat; It is good to have your doctor check your skin annually or review any new areas of concern 5. Driving safety; Keep in the right lane; stay 3 car lengths behind the car in front of you on the highway; look 3 times prior to pulling out; carry your cell phone everywhere you go!    Learn about the  Yellow Dot program:  The program allows first responders at your emergency to have access to who your physician is, as well as your medications and medical conditions.  Citizens requesting the Yellow Dot Packages should contact Master Corporal Nunzio Cobbs at the [facility] [phone] for the first week of the program and beginning the week after Easter citizens should contact their Scientist, physiological.      Health Maintenance, Female Adopting a healthy lifestyle and getting preventive care can go a long way to promote health and wellness. Talk with your health care provider about what schedule of regular examinations is right for you. This is a good chance for you to check in with your provider about disease prevention and staying healthy. In between checkups, there are plenty of things you can do on your own. Experts have done a lot of research about which lifestyle changes and preventive measures are most likely to keep you healthy. Ask your health care provider for more information. Weight and diet Eat a healthy diet  Be sure to include plenty of vegetables, fruits, low-fat dairy products, and lean protein.  Do not eat a lot of foods high in solid fats, added sugars, or salt.  Get regular exercise. This is one of the most important things you can do for your health. ? Most adults should exercise for at least 150 minutes each week. The exercise should increase your heart rate and make you sweat (moderate-intensity exercise). ? Most adults should also do strengthening exercises at least twice a week. This is in addition to the moderate-intensity exercise.  Maintain a healthy weight  Body mass index (BMI) is a measurement that can be used to identify possible weight problems. It estimates body fat based on height and weight. Your health care provider can help determine your BMI and help you achieve or maintain a healthy weight.  For females 87 years of  age and older: ? A BMI below 18.5 is considered underweight. ? A BMI of 18.5 to 24.9 is normal. ? A BMI of 25 to 29.9 is considered overweight. ? A BMI of 30  and above is considered obese.  Watch levels of cholesterol and blood lipids  You should start having your blood tested for lipids and cholesterol at 75 years of age, then have this test every 5 years.  You may need to have your cholesterol levels checked more often if: ? Your lipid or cholesterol levels are high. ? You are older than 75 years of age. ? You are at high risk for heart disease.  Cancer screening Lung Cancer  Lung cancer screening is recommended for adults 6-53 years old who are at high risk for lung cancer because of a history of smoking.  A yearly low-dose CT scan of the lungs is recommended for people who: ? Currently smoke. ? Have quit within the past 15 years. ? Have at least a 30-pack-year history of smoking. A pack year is smoking an average of one pack of cigarettes a day for 1 year.  Yearly screening should continue until it has been 15 years since you quit.  Yearly screening should stop if you develop a health problem that would prevent you from having lung cancer treatment.  Breast Cancer  Practice breast self-awareness. This means understanding how your breasts normally appear and feel.  It also means doing regular breast self-exams. Let your health care provider know about any changes, no matter how small.  If you are in your 20s or 30s, you should have a clinical breast exam (CBE) by a health care provider every 1-3 years as part of a regular health exam.  If you are 22 or older, have a CBE every year. Also consider having a breast X-ray (mammogram) every year.  If you have a family history of breast cancer, talk to your health care provider about genetic screening.  If you are at high risk for breast cancer, talk to your health care provider about having an MRI and a mammogram every  year.  Breast cancer gene (BRCA) assessment is recommended for women who have family members with BRCA-related cancers. BRCA-related cancers include: ? Breast. ? Ovarian. ? Tubal. ? Peritoneal cancers.  Results of the assessment will determine the need for genetic counseling and BRCA1 and BRCA2 testing.  Cervical Cancer Your health care provider may recommend that you be screened regularly for cancer of the pelvic organs (ovaries, uterus, and vagina). This screening involves a pelvic examination, including checking for microscopic changes to the surface of your cervix (Pap test). You may be encouraged to have this screening done every 3 years, beginning at age 52.  For women ages 61-65, health care providers may recommend pelvic exams and Pap testing every 3 years, or they may recommend the Pap and pelvic exam, combined with testing for human papilloma virus (HPV), every 5 years. Some types of HPV increase your risk of cervical cancer. Testing for HPV may also be done on women of any age with unclear Pap test results.  Other health care providers may not recommend any screening for nonpregnant women who are considered low risk for pelvic cancer and who do not have symptoms. Ask your health care provider if a screening pelvic exam is right for you.  If you have had past treatment for cervical cancer or a condition that could lead to cancer, you need Pap tests and screening for cancer for at least 20 years after your treatment. If Pap tests have been discontinued, your risk factors (such as having a new sexual partner) need to be reassessed to determine if screening should resume. Some women  have medical problems that increase the chance of getting cervical cancer. In these cases, your health care provider may recommend more frequent screening and Pap tests.  Colorectal Cancer  This type of cancer can be detected and often prevented.  Routine colorectal cancer screening usually begins at 75  years of age and continues through 75 years of age.  Your health care provider may recommend screening at an earlier age if you have risk factors for colon cancer.  Your health care provider may also recommend using home test kits to check for hidden blood in the stool.  A small camera at the end of a tube can be used to examine your colon directly (sigmoidoscopy or colonoscopy). This is done to check for the earliest forms of colorectal cancer.  Routine screening usually begins at age 25.  Direct examination of the colon should be repeated every 5-10 years through 75 years of age. However, you may need to be screened more often if early forms of precancerous polyps or small growths are found.  Skin Cancer  Check your skin from head to toe regularly.  Tell your health care provider about any new moles or changes in moles, especially if there is a change in a mole's shape or color.  Also tell your health care provider if you have a mole that is larger than the size of a pencil eraser.  Always use sunscreen. Apply sunscreen liberally and repeatedly throughout the day.  Protect yourself by wearing long sleeves, pants, a wide-brimmed hat, and sunglasses whenever you are outside.  Heart disease, diabetes, and high blood pressure  High blood pressure causes heart disease and increases the risk of stroke. High blood pressure is more likely to develop in: ? People who have blood pressure in the high end of the normal range (130-139/85-89 mm Hg). ? People who are overweight or obese. ? People who are African American.  If you are 47-20 years of age, have your blood pressure checked every 3-5 years. If you are 47 years of age or older, have your blood pressure checked every year. You should have your blood pressure measured twice-once when you are at a hospital or clinic, and once when you are not at a hospital or clinic. Record the average of the two measurements. To check your blood pressure  when you are not at a hospital or clinic, you can use: ? An automated blood pressure machine at a pharmacy. ? A home blood pressure monitor.  If you are between 41 years and 33 years old, ask your health care provider if you should take aspirin to prevent strokes.  Have regular diabetes screenings. This involves taking a blood sample to check your fasting blood sugar level. ? If you are at a normal weight and have a low risk for diabetes, have this test once every three years after 75 years of age. ? If you are overweight and have a high risk for diabetes, consider being tested at a younger age or more often. Preventing infection Hepatitis B  If you have a higher risk for hepatitis B, you should be screened for this virus. You are considered at high risk for hepatitis B if: ? You were born in a country where hepatitis B is common. Ask your health care provider which countries are considered high risk. ? Your parents were born in a high-risk country, and you have not been immunized against hepatitis B (hepatitis B vaccine). ? You have HIV or AIDS. ? You  use needles to inject street drugs. ? You live with someone who has hepatitis B. ? You have had sex with someone who has hepatitis B. ? You get hemodialysis treatment. ? You take certain medicines for conditions, including cancer, organ transplantation, and autoimmune conditions.  Hepatitis C  Blood testing is recommended for: ? Everyone born from [birth year] through 1964. ? Anyone with known risk factors for hepatitis C.  Sexually transmitted infections (STIs)  You should be screened for sexually transmitted infections (STIs) including gonorrhea and chlamydia if: ? You are sexually active and are younger than 75 years of age. ? You are older than 75 years of age and your health care provider tells you that you are at risk for this type of infection. ? Your sexual activity has changed since you were last screened and you are at an increased  risk for chlamydia or gonorrhea. Ask your health care provider if you are at risk.  If you do not have HIV, but are at risk, it may be recommended that you take a prescription medicine daily to prevent HIV infection. This is called pre-exposure prophylaxis (PrEP). You are considered at risk if: ? You are sexually active and do not regularly use condoms or know the HIV status of your partner(s). ? You take drugs by injection. ? You are sexually active with a partner who has HIV.  Talk with your health care provider about whether you are at high risk of being infected with HIV. If you choose to begin PrEP, you should first be tested for HIV. You should then be tested every 3 months for as long as you are taking PrEP. Pregnancy  If you are premenopausal and you may become pregnant, ask your health care provider about preconception counseling.  If you may become pregnant, take 400 to 800 micrograms (mcg) of folic acid every day.  If you want to prevent pregnancy, talk to your health care provider about birth control (contraception). Osteoporosis and menopause  Osteoporosis is a disease in which the bones lose minerals and strength with aging. This can result in serious bone fractures. Your risk for osteoporosis can be identified using a bone density scan.  If you are 30 years of age or older, or if you are at risk for osteoporosis and fractures, ask your health care provider if you should be screened.  Ask your health care provider whether you should take a calcium or vitamin D supplement to lower your risk for osteoporosis.  Menopause may have certain physical symptoms and risks.  Hormone replacement therapy may reduce some of these symptoms and risks. Talk to your health care provider about whether hormone replacement therapy is right for you. Follow these instructions at home:  Schedule regular health, dental, and eye exams.  Stay current with your immunizations.  Do not use any  tobacco products including cigarettes, chewing tobacco, or electronic cigarettes.  If you are pregnant, do not drink alcohol.  If you are breastfeeding, limit how much and how often you drink alcohol.  Limit alcohol intake to no more than 1 drink per day for nonpregnant women. One drink equals 12 ounces of beer, 5 ounces of wine, or 1 ounces of hard liquor.  Do not use street drugs.  Do not share needles.  Ask your health care provider for help if you need support or information about quitting drugs.  Tell your health care provider if you often feel depressed.  Tell your health care provider if you have ever  been abused or do not feel safe at home. This information is not intended to replace advice given to you by your health care provider. Make sure you discuss any questions you have with your health care provider. Document Released: 07/05/2010 Document Revised: 05/28/2015 Document Reviewed: 09/23/2014 Elsevier Interactive Patient Education  2018 [place] in the Home Falls can cause injuries and can affect people from all age groups. There are many simple things that you can do to make your home safe and to help prevent falls. What can I do on the outside of my home?  Regularly repair the edges of walkways and driveways and fix any cracks.  Remove high doorway thresholds.  Trim any shrubbery on the main path into your home.  Use bright outdoor lighting.  Clear walkways of debris and clutter, including tools and rocks.  Regularly check that handrails are securely fastened and in good repair. Both sides of any steps should have handrails.  Install guardrails along the edges of any raised decks or porches.  Have leaves, snow, and ice cleared regularly.  Use sand or salt on walkways during winter months.  In the garage, clean up any spills right away, including grease or oil spills. What can I do in the bathroom?  Use night lights.  Install  grab bars by the toilet and in the tub and shower. Do not use towel bars as grab bars.  Use non-skid mats or decals on the floor of the tub or shower.  If you need to sit down while you are in the shower, use a plastic, non-slip stool.  Keep the floor dry. Immediately clean up any water that spills on the floor.  Remove soap buildup in the tub or shower on a regular basis.  Attach bath mats securely with double-sided non-slip rug tape.  Remove throw rugs and other tripping hazards from the floor. What can I do in the bedroom?  Use night lights.  Make sure that a bedside light is easy to reach.  Do not use oversized bedding that drapes onto the floor.  Have a firm chair that has side arms to use for getting dressed.  Remove throw rugs and other tripping hazards from the floor. What can I do in the kitchen?  Clean up any spills right away.  Avoid walking on wet floors.  Place frequently used items in easy-to-reach places.  If you need to reach for something above you, use a sturdy step stool that has a grab bar.  Keep electrical cables out of the way.  Do not use floor polish or wax that makes floors slippery. If you have to use wax, make sure that it is non-skid floor wax.  Remove throw rugs and other tripping hazards from the floor. What can I do in the stairways?  Do not leave any items on the stairs.  Make sure that there are handrails on both sides of the stairs. Fix handrails that are broken or loose. Make sure that handrails are as long as the stairways.  Check any carpeting to make sure that it is firmly attached to the stairs. Fix any carpet that is loose or worn.  Avoid having throw rugs at the top or bottom of stairways, or secure the rugs with carpet tape to prevent them from moving.  Make sure that you have a light switch at the top of the stairs and the bottom of the stairs. If you do not have them, have  them installed. What are some other fall prevention  tips?  Wear closed-toe shoes that fit well and support your feet. Wear shoes that have rubber soles or low heels.  When you use a stepladder, make sure that it is completely opened and that the sides are firmly locked. Have someone hold the ladder while you are using it. Do not climb a closed stepladder.  Add color or contrast paint or tape to grab bars and handrails in your home. Place contrasting color strips on the first and last steps.  Use mobility aids as needed, such as canes, walkers, scooters, and crutches.  Turn on lights if it is dark. Replace any light bulbs that burn out.  Set up furniture so that there are clear paths. Keep the furniture in the same spot.  Fix any uneven floor surfaces.  Choose a carpet design that does not hide the edge of steps of a stairway.  Be aware of any and all pets.  Review your medicines with your healthcare provider. Some medicines can cause dizziness or changes in blood pressure, which increase your risk of falling. Talk with your health care provider about other ways that you can decrease your risk of falls. This may include working with a physical therapist or trainer to improve your strength, balance, and endurance. This information is not intended to replace advice given to you by your health care provider. Make sure you discuss any questions you have with your health care provider. Document Released: 12/10/2001 Document Revised: 05/19/2015 Document Reviewed: 01/24/2014 Elsevier Interactive Patient Education  2017 [place]., DO

## 2016-10-28 ENCOUNTER — Ambulatory Visit (INDEPENDENT_AMBULATORY_CARE_PROVIDER_SITE_OTHER): Payer: Medicare Other | Admitting: Family Medicine

## 2016-10-28 ENCOUNTER — Encounter: Payer: Self-pay | Admitting: Family Medicine

## 2016-10-28 ENCOUNTER — Other Ambulatory Visit: Payer: Self-pay | Admitting: *Deleted

## 2016-10-28 VITALS — BP 138/82 | HR 65 | Temp 97.6°F | Ht 67.0 in | Wt 167.6 lb

## 2016-10-28 DIAGNOSIS — E1169 Type 2 diabetes mellitus with other specified complication: Secondary | ICD-10-CM | POA: Diagnosis not present

## 2016-10-28 DIAGNOSIS — E785 Hyperlipidemia, unspecified: Secondary | ICD-10-CM

## 2016-10-28 DIAGNOSIS — E1159 Type 2 diabetes mellitus with other circulatory complications: Secondary | ICD-10-CM

## 2016-10-28 DIAGNOSIS — I152 Hypertension secondary to endocrine disorders: Secondary | ICD-10-CM

## 2016-10-28 DIAGNOSIS — I10 Essential (primary) hypertension: Secondary | ICD-10-CM

## 2016-10-28 DIAGNOSIS — E039 Hypothyroidism, unspecified: Secondary | ICD-10-CM

## 2016-10-28 DIAGNOSIS — E1140 Type 2 diabetes mellitus with diabetic neuropathy, unspecified: Secondary | ICD-10-CM

## 2016-10-28 DIAGNOSIS — D6859 Other primary thrombophilia: Secondary | ICD-10-CM | POA: Diagnosis not present

## 2016-10-28 MED ORDER — METOPROLOL TARTRATE 25 MG PO TABS: 25.0000 mg | ORAL_TABLET | Freq: Two times a day (BID) | ORAL | 1 refills | Status: DC

## 2016-10-28 MED ORDER — LEVOTHYROXINE SODIUM 25 MCG PO TABS: ORAL_TABLET | ORAL | 1 refills | Status: DC

## 2016-10-28 NOTE — Patient Instructions (Addendum)
BEFORE YOU LEAVE: -refills -AWV with susan -follow up: 4 months    Hannah [name] , Thank you for taking time to come for your Medicare Wellness Visit. I appreciate your ongoing commitment to your health goals. Please review the following plan we discussed and let me know if I can assist you in the future.   Will have dexa scan next year when you have your mammogram  [facility]   Planned to schedule eye exam with dtr  Also ask [facility] to send [place] a report   You have taken the Zoster for shingles in July 2017 This year, in 2018 there is a new shingles vaccine called the shingrix  Shingrix is a vaccine for the prevention of Shingles in Adults 50 and older.  If you are on Medicare, you can request a prescription from your doctor to be filled at a pharmacy.  Please check with your benefits regarding applicable copays or out of pocket expenses.  The Shingrix is given in 2 vaccines approx 8 weeks apart. You must receive the 2nd dose prior to 6 months from receipt of the first.     These are the goals we discussed: Goals    . patient          To maintain exercise at the [employer]!       This is a list of the screening recommended for you and due dates:  Health Maintenance  Topic Date Due  . Eye exam for diabetics  06/22/2016  . Hemoglobin A1C  02/04/2017  . Complete foot exam   07/26/2017  . Tetanus Vaccine  04/06/2024  . Flu Shot  Completed  . DEXA scan (bone density measurement)  Addressed  . Pneumonia vaccines  Completed    Prevention of falls: Remove rugs or any tripping hazards in the home Use Non slip mats in bathtubs and showers Placing grab bars next to the toilet and or shower Placing handrails on both sides of the stair way Adding extra lighting in the home.   Personal safety issues reviewed:  1. Consider starting a community watch program per [facility] 2.  Changes batteries is smoke detector and/or carbon monoxide detector  3.  If you  have firearms; keep them in a safe place 4.  Wear protection when in the sun; Always wear sunscreen or a hat; It is good to have your doctor check your skin annually or review any new areas of concern 5. Driving safety; Keep in the right lane; stay 3 car lengths behind the car in front of you on the highway; look 3 times prior to pulling out; carry your cell phone everywhere you go!    Learn about the Yellow Dot program:  The program allows first responders at your emergency to have access to who your physician is, as well as your medications and medical conditions.  Citizens requesting the Yellow Dot Packages should contact Master Corporal Nunzio Cobbs at the [facility] [phone] for the first week of the program and beginning the week after Easter citizens should contact their Scientist, physiological.      Health Maintenance, Female Adopting a healthy lifestyle and getting preventive care can go a long way to promote health and wellness. Talk with your health care provider about what schedule of regular examinations is right for you. This is a good chance for you to check in with your provider about disease prevention and staying healthy. In between checkups, there are  plenty of things you can do on your own. Experts have done a lot of research about which lifestyle changes and preventive measures are most likely to keep you healthy. Ask your health care provider for more information. Weight and diet Eat a healthy diet  Be sure to include plenty of vegetables, fruits, low-fat dairy products, and lean protein.  Do not eat a lot of foods high in solid fats, added sugars, or salt.  Get regular exercise. This is one of the most important things you can do for your health. ? Most adults should exercise for at least 150 minutes each week. The exercise should increase your heart rate and make you sweat (moderate-intensity exercise). ? Most adults should also do  strengthening exercises at least twice a week. This is in addition to the moderate-intensity exercise.  Maintain a healthy weight  Body mass index (BMI) is a measurement that can be used to identify possible weight problems. It estimates body fat based on height and weight. Your health care provider can help determine your BMI and help you achieve or maintain a healthy weight.  For females 8 years of age and older: ? A BMI below 18.5 is considered underweight. ? A BMI of 18.5 to 24.9 is normal. ? A BMI of 25 to 29.9 is considered overweight. ? A BMI of 30 and above is considered obese.  Watch levels of cholesterol and blood lipids  You should start having your blood tested for lipids and cholesterol at 75 years of age, then have this test every 5 years.  You may need to have your cholesterol levels checked more often if: ? Your lipid or cholesterol levels are high. ? You are older than 75 years of age. ? You are at high risk for heart disease.  Cancer screening Lung Cancer  Lung cancer screening is recommended for adults 72-74 years old who are at high risk for lung cancer because of a history of smoking.  A yearly low-dose CT scan of the lungs is recommended for people who: ? Currently smoke. ? Have quit within the past 15 years. ? Have at least a 30-pack-year history of smoking. A pack year is smoking an average of one pack of cigarettes a day for 1 year.  Yearly screening should continue until it has been 15 years since you quit.  Yearly screening should stop if you develop a health problem that would prevent you from having lung cancer treatment.  Breast Cancer  Practice breast self-awareness. This means understanding how your breasts normally appear and feel.  It also means doing regular breast self-exams. Let your health care provider know about any changes, no matter how small.  If you are in your 20s or 30s, you should have a clinical breast exam (CBE) by a health  care provider every 1-3 years as part of a regular health exam.  If you are 74 or older, have a CBE every year. Also consider having a breast X-ray (mammogram) every year.  If you have a family history of breast cancer, talk to your health care provider about genetic screening.  If you are at high risk for breast cancer, talk to your health care provider about having an MRI and a mammogram every year.  Breast cancer gene (BRCA) assessment is recommended for women who have family members with BRCA-related cancers. BRCA-related cancers include: ? Breast. ? Ovarian. ? Tubal. ? Peritoneal cancers.  Results of the assessment will determine the need for genetic counseling and  BRCA1 and BRCA2 testing.  Cervical Cancer Your health care provider may recommend that you be screened regularly for cancer of the pelvic organs (ovaries, uterus, and vagina). This screening involves a pelvic examination, including checking for microscopic changes to the surface of your cervix (Pap test). You may be encouraged to have this screening done every 3 years, beginning at age 26.  For women ages 72-65, health care providers may recommend pelvic exams and Pap testing every 3 years, or they may recommend the Pap and pelvic exam, combined with testing for human papilloma virus (HPV), every 5 years. Some types of HPV increase your risk of cervical cancer. Testing for HPV may also be done on women of any age with unclear Pap test results.  Other health care providers may not recommend any screening for nonpregnant women who are considered low risk for pelvic cancer and who do not have symptoms. Ask your health care provider if a screening pelvic exam is right for you.  If you have had past treatment for cervical cancer or a condition that could lead to cancer, you need Pap tests and screening for cancer for at least 20 years after your treatment. If Pap tests have been discontinued, your risk factors (such as having a new  sexual partner) need to be reassessed to determine if screening should resume. Some women have medical problems that increase the chance of getting cervical cancer. In these cases, your health care provider may recommend more frequent screening and Pap tests.  Colorectal Cancer  This type of cancer can be detected and often prevented.  Routine colorectal cancer screening usually begins at 75 years of age and continues through 75 years of age.  Your health care provider may recommend screening at an earlier age if you have risk factors for colon cancer.  Your health care provider may also recommend using home test kits to check for hidden blood in the stool.  A small camera at the end of a tube can be used to examine your colon directly (sigmoidoscopy or colonoscopy). This is done to check for the earliest forms of colorectal cancer.  Routine screening usually begins at age 73.  Direct examination of the colon should be repeated every 5-10 years through 75 years of age. However, you may need to be screened more often if early forms of precancerous polyps or small growths are found.  Skin Cancer  Check your skin from head to toe regularly.  Tell your health care provider about any new moles or changes in moles, especially if there is a change in a mole's shape or color.  Also tell your health care provider if you have a mole that is larger than the size of a pencil eraser.  Always use sunscreen. Apply sunscreen liberally and repeatedly throughout the day.  Protect yourself by wearing long sleeves, pants, a wide-brimmed hat, and sunglasses whenever you are outside.  Heart disease, diabetes, and high blood pressure  High blood pressure causes heart disease and increases the risk of stroke. High blood pressure is more likely to develop in: ? People who have blood pressure in the high end of the normal range (130-139/85-89 mm Hg). ? People who are overweight or obese. ? People who are  African American.  If you are 75-87 years of age, have your blood pressure checked every 3-5 years. If you are 36 years of age or older, have your blood pressure checked every year. You should have your blood pressure measured twice-once when  you are at a hospital or clinic, and once when you are not at a hospital or clinic. Record the average of the two measurements. To check your blood pressure when you are not at a hospital or clinic, you can use: ? An automated blood pressure machine at a pharmacy. ? A home blood pressure monitor.  If you are between 62 years and 7 years old, ask your health care provider if you should take aspirin to prevent strokes.  Have regular diabetes screenings. This involves taking a blood sample to check your fasting blood sugar level. ? If you are at a normal weight and have a low risk for diabetes, have this test once every three years after 75 years of age. ? If you are overweight and have a high risk for diabetes, consider being tested at a younger age or more often. Preventing infection Hepatitis B  If you have a higher risk for hepatitis B, you should be screened for this virus. You are considered at high risk for hepatitis B if: ? You were born in a country where hepatitis B is common. Ask your health care provider which countries are considered high risk. ? Your parents were born in a high-risk country, and you have not been immunized against hepatitis B (hepatitis B vaccine). ? You have HIV or AIDS. ? You use needles to inject street drugs. ? You live with someone who has hepatitis B. ? You have had sex with someone who has hepatitis B. ? You get hemodialysis treatment. ? You take certain medicines for conditions, including cancer, organ transplantation, and autoimmune conditions.  Hepatitis C  Blood testing is recommended for: ? Everyone born from [birth year] through 1964. ? Anyone with known risk factors for hepatitis C.  Sexually transmitted  infections (STIs)  You should be screened for sexually transmitted infections (STIs) including gonorrhea and chlamydia if: ? You are sexually active and are younger than 75 years of age. ? You are older than 75 years of age and your health care provider tells you that you are at risk for this type of infection. ? Your sexual activity has changed since you were last screened and you are at an increased risk for chlamydia or gonorrhea. Ask your health care provider if you are at risk.  If you do not have HIV, but are at risk, it may be recommended that you take a prescription medicine daily to prevent HIV infection. This is called pre-exposure prophylaxis (PrEP). You are considered at risk if: ? You are sexually active and do not regularly use condoms or know the HIV status of your partner(s). ? You take drugs by injection. ? You are sexually active with a partner who has HIV.  Talk with your health care provider about whether you are at high risk of being infected with HIV. If you choose to begin PrEP, you should first be tested for HIV. You should then be tested every 3 months for as long as you are taking PrEP. Pregnancy  If you are premenopausal and you may become pregnant, ask your health care provider about preconception counseling.  If you may become pregnant, take 400 to 800 micrograms (mcg) of folic acid every day.  If you want to prevent pregnancy, talk to your health care provider about birth control (contraception). Osteoporosis and menopause  Osteoporosis is a disease in which the bones lose minerals and strength with aging. This can result in serious bone fractures. Your risk for osteoporosis can be identified  using a bone density scan.  If you are 39 years of age or older, or if you are at risk for osteoporosis and fractures, ask your health care provider if you should be screened.  Ask your health care provider whether you should take a calcium or vitamin D supplement to lower  your risk for osteoporosis.  Menopause may have certain physical symptoms and risks.  Hormone replacement therapy may reduce some of these symptoms and risks. Talk to your health care provider about whether hormone replacement therapy is right for you. Follow these instructions at home:  Schedule regular health, dental, and eye exams.  Stay current with your immunizations.  Do not use any tobacco products including cigarettes, chewing tobacco, or electronic cigarettes.  If you are pregnant, do not drink alcohol.  If you are breastfeeding, limit how much and how often you drink alcohol.  Limit alcohol intake to no more than 1 drink per day for nonpregnant women. One drink equals 12 ounces of beer, 5 ounces of wine, or 1 ounces of hard liquor.  Do not use street drugs.  Do not share needles.  Ask your health care provider for help if you need support or information about quitting drugs.  Tell your health care provider if you often feel depressed.  Tell your health care provider if you have ever been abused or do not feel safe at home. This information is not intended to replace advice given to you by your health care provider. Make sure you discuss any questions you have with your health care provider. Document Released: 07/05/2010 Document Revised: 05/28/2015 Document Reviewed: 09/23/2014 Elsevier Interactive Patient Education  2018 [place] in the Home Falls can cause injuries and can affect people from all age groups. There are many simple things that you can do to make your home safe and to help prevent falls. What can I do on the outside of my home?  Regularly repair the edges of walkways and driveways and fix any cracks.  Remove high doorway thresholds.  Trim any shrubbery on the main path into your home.  Use bright outdoor lighting.  Clear walkways of debris and clutter, including tools and rocks.  Regularly check that handrails are  securely fastened and in good repair. Both sides of any steps should have handrails.  Install guardrails along the edges of any raised decks or porches.  Have leaves, snow, and ice cleared regularly.  Use sand or salt on walkways during winter months.  In the garage, clean up any spills right away, including grease or oil spills. What can I do in the bathroom?  Use night lights.  Install grab bars by the toilet and in the tub and shower. Do not use towel bars as grab bars.  Use non-skid mats or decals on the floor of the tub or shower.  If you need to sit down while you are in the shower, use a plastic, non-slip stool.  Keep the floor dry. Immediately clean up any water that spills on the floor.  Remove soap buildup in the tub or shower on a regular basis.  Attach bath mats securely with double-sided non-slip rug tape.  Remove throw rugs and other tripping hazards from the floor. What can I do in the bedroom?  Use night lights.  Make sure that a bedside light is easy to reach.  Do not use oversized bedding that drapes onto the floor.  Have a firm chair that has side  arms to use for getting dressed.  Remove throw rugs and other tripping hazards from the floor. What can I do in the kitchen?  Clean up any spills right away.  Avoid walking on wet floors.  Place frequently used items in easy-to-reach places.  If you need to reach for something above you, use a sturdy step stool that has a grab bar.  Keep electrical cables out of the way.  Do not use floor polish or wax that makes floors slippery. If you have to use wax, make sure that it is non-skid floor wax.  Remove throw rugs and other tripping hazards from the floor. What can I do in the stairways?  Do not leave any items on the stairs.  Make sure that there are handrails on both sides of the stairs. Fix handrails that are broken or loose. Make sure that handrails are as long as the stairways.  Check any  carpeting to make sure that it is firmly attached to the stairs. Fix any carpet that is loose or worn.  Avoid having throw rugs at the top or bottom of stairways, or secure the rugs with carpet tape to prevent them from moving.  Make sure that you have a light switch at the top of the stairs and the bottom of the stairs. If you do not have them, have them installed. What are some other fall prevention tips?  Wear closed-toe shoes that fit well and support your feet. Wear shoes that have rubber soles or low heels.  When you use a stepladder, make sure that it is completely opened and that the sides are firmly locked. Have someone hold the ladder while you are using it. Do not climb a closed stepladder.  Add color or contrast paint or tape to grab bars and handrails in your home. Place contrasting color strips on the first and last steps.  Use mobility aids as needed, such as canes, walkers, scooters, and crutches.  Turn on lights if it is dark. Replace any light bulbs that burn out.  Set up furniture so that there are clear paths. Keep the furniture in the same spot.  Fix any uneven floor surfaces.  Choose a carpet design that does not hide the edge of steps of a stairway.  Be aware of any and all pets.  Review your medicines with your healthcare provider. Some medicines can cause dizziness or changes in blood pressure, which increase your risk of falling. Talk with your health care provider about other ways that you can decrease your risk of falls. This may include working with a physical therapist or trainer to improve your strength, balance, and endurance. This information is not intended to replace advice given to you by your health care provider. Make sure you discuss any questions you have with your health care provider. Document Released: 12/10/2001 Document Revised: 05/19/2015 Document Reviewed: 01/24/2014 Elsevier Interactive Patient Education  2017 [employer].

## 2016-10-28 NOTE — Progress Notes (Signed)
Subjective:   Griselda MinerDaisy Harbeck is a 76 y.o. female who presents for Medicare Annual (Subsequent) preventive examination.  The Patient was informed that the wellness visit is to identify future health risk and educate and initiate measures that can reduce risk for increased disease through the lifespan.    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 [employer]   Preventive Screening -Counseling & Management  Medicare Annual Preventive Care Visit - Subsequent Last OV today  Mammogram 10/26/16 Dexa -1.4 - April 2016 - will wait until next year to repeat Takes regular Vit D; recommended 1000u  Colonoscopy this year March 2018    VS reviewed;    Diet  Food brought in by family  Unless she wants to cook  Has vegetables and fruits Monitors green vegetables due to coumadin Controlled the last several times   BMI  26.2   Exercise Goes to exercise at church every Monday and Thursday Works according to their age and ability      Cardiac Risk Factors Addressed Hyperlipidemia - ratio 2  Diabetes A1c is 6.7   Obesity neg  Advanced Directives NO  Patient Care Team: Terressa KoyanagiKim, Hannah R, DO as PCP - General (Family Medicine)    Cardiac Risk Factors include: advanced age (>2255men, 48>65 women);diabetes mellitus;dyslipidemia     Objective:     Vitals: BP 138/82 (BP Location: Left Arm, Patient Position: Sitting, Cuff Size: Normal)   Pulse 65   Temp 97.6 F (36.4 C) (Oral)   Ht 5\' 7"  (1.702 m)   Wt 167 lb 9.6 oz (76 kg)   BMI 26.25 kg/m   Body mass index is 26.25 kg/m.   Tobacco History  Smoking Status  . Former Smoker  . Packs/day: 1.00  . Years: 10.00  . Types: Cigarettes  . Quit date: 01/04/1968  Smokeless Tobacco  . Never Used     Counseling given: Yes   Past Medical History:  Diagnosis Date  . Acute massive pulmonary embolism (HCC) 07/13/2012   Massive PE w/ PEA arrest 07/13/12 >TNK >IVC filter >discharged on comadin    . Diabetes mellitus, type 2 (HCC) 07/15/2012   with peripheral neuropathy  . Hyperlipemia 07/14/2012  . Hypertension 07/14/2012  . Large bowel stricture (HCC)    s/p colectomy in 2016  . Osteoarthritis    s/p hip and knee replacements  . Thyroid disease    Past Surgical History:  Procedure Laterality Date  . COLONOSCOPY N/A 03/12/2016   Procedure: COLONOSCOPY;  Surgeon: Dorena CookeyJohn Hayes, MD;  [facility];  Service: Endoscopy;  Laterality: N/A;  . COLOSTOMY N/A 06/03/2014   Procedure: COLOSTOMY;  Surgeon: Harriette Bouillonhomas Cornett, MD;  [facility];  Service: General;  Laterality: N/A;  . FLEXIBLE SIGMOIDOSCOPY N/A 05/30/2014   Procedure: Arnell SievingFLEXIBLE SIGMOIDOSCOPY;  Surgeon: Jeani HawkingPatrick Hung, MD;  [facility];  Service: Endoscopy;  Laterality: N/A;  . INSERTION OF VENA CAVA FILTER N/A 07/16/2012   Procedure: INSERTION OF VENA CAVA FILTER;  Surgeon: Sherren Kernsharles E Fields, MD;  [facility];  Service: Cardiovascular;  Laterality: N/A;  . KNEE ARTHROSCOPY    . PARTIAL COLECTOMY N/A 06/03/2014   Procedure: PARTIAL COLECTOMY;  Surgeon: Harriette Bouillonhomas Cornett, MD;  Location: [facility] OR;  Service: General;  Laterality: N/A;  . REDUCTION MAMMAPLASTY Bilateral   . SP ARTHRO HIP*L*     Family History  Problem Relation Age of  Onset  . Breast cancer Mother    History  Sexual Activity  . Sexual activity: Not on file    Outpatient Encounter Prescriptions as of 10/28/2016  Medication Sig  . acetaminophen (TYLENOL) 500 MG tablet Take 500 mg by mouth every 6 (six) hours as needed (pain).  Marland Kitchen lisinopril (PRINIVIL,ZESTRIL) 40 MG tablet Take 1 tablet (40 mg total) by mouth daily.  . metFORMIN (GLUCOPHAGE) 1000 MG tablet TAKE ONE TABLET BY MOUTH WITH BREAKFAST AND TAKE ONE TABLET WITH DINNER  . ONETOUCH DELICA LANCETS FINE MISC USE ONCE DAILY  . simvastatin (ZOCOR) 20 MG tablet Take 1 tablet (20 mg total) by mouth at bedtime.  . vitamin  C (ASCORBIC ACID) 500 MG tablet Take 500 mg by mouth daily.   Marland Kitchen warfarin (COUMADIN) 5 MG tablet Take as directed by [facility]  . [DISCONTINUED] levothyroxine (SYNTHROID, LEVOTHROID) 25 MCG tablet Take 1 tablet by mouth  daily before breakfast  . [DISCONTINUED] metoprolol tartrate (LOPRESSOR) 25 MG tablet Take 1 tablet (25 mg total) by mouth 2 (two) times daily.  . [DISCONTINUED] IRON PO Take by mouth.   No facility-administered encounter medications on file as of 10/28/2016.     Activities of Daily Living In your present state of health, do you have any difficulty performing the following activities: 10/28/2016 03/10/2016  Hearing? N -  Vision? N -  Difficulty concentrating or making decisions? N -  Walking or climbing stairs? Y -  Comment slowly -  Dressing or bathing? N -  Doing errands, shopping? N N  Preparing Food and eating ? N -  Using the Toilet? N -  In the past six months, have you accidently leaked urine? N -  Do you have problems with loss of bowel control? N -  Managing your Medications? N -  Managing your Finances? N -  Housekeeping or managing your Housekeeping? N -  Some recent data might be hidden    Patient Care Team: [name], DO as PCP - General (Family Medicine)    Assessment:     Exercise Activities and Dietary recommendations Current Exercise Habits: Home exercise routine;Structured exercise class, Type of exercise: walking;strength training/weights, Time (Minutes): 60, Frequency (Times/Week): 3, Weekly Exercise (Minutes/Week): 180, Intensity: Moderate  Goals    . patient          To maintain exercise at the church!      Fall Risk Fall Risk  10/28/2016 04/10/2015 08/12/2014 02/25/2013  Falls in the past year? No No No Yes  Number falls in past yr: - - - 1  Injury with Fall? - - - No   Depression Screen PHQ 2/9 Scores 10/28/2016 04/10/2015 08/12/2014 02/25/2013  PHQ - 2 Score 0 0 0 0     Cognitive Function MMSE - Mini Mental State Exam  10/28/2016  Not completed: (No Data)     Ad8 score reviewed for issues:  Issues making decisions:  Less interest in hobbies / activities:  Repeats questions, stories (family complaining):  Trouble using ordinary gadgets (microwave, computer, phone):  Forgets the month or year:   Mismanaging finances:   Remembering appts:  Daily problems with thinking and/or memory: Ad8 score is=0   6CIT Screen 10/28/2016  What Year? 0 points  What month? 0 points  What time? 0 points  Count back from 20 0 points  Months in reverse 0 points  Repeat phrase 0 points  Total Score 0  Safety reviewed Has life alert at home  Has button  to wear around her neck at home Has it own at home    Immunization History  Administered Date(s) Administered  . Influenza Split 10/03/2012  . Influenza Whole 10/04/2011  . Influenza, High Dose Seasonal PF 11/12/2014, 10/21/2015  . Influenza,inj,Quad PF,6+ Mos 11/07/2013  . Influenza-Unspecified 10/03/2016  . Pneumococcal Conjugate-13 04/07/2014  . Pneumococcal-Unspecified 10/04/2011  . Tdap 04/07/2014  . Zoster 07/23/2015   Screening Tests Health Maintenance  Topic Date Due  . OPHTHALMOLOGY EXAM  06/22/2016  . HEMOGLOBIN A1C  02/04/2017  . FOOT EXAM  07/26/2017  . TETANUS/TDAP  04/06/2024  . INFLUENZA VACCINE  Completed  . DEXA SCAN  Addressed  . PNA vac Low Risk Adult  Completed      Plan:       PCP Notes   Health Maintenance Discussed Dexa and will have this next year; declined this year Recommended she take 1000 of Vit D every day   Will have eye exam this year with her dtr;  Goes To [facility]   Educated regarding the Shingrix  Had Zoster July 2017 and will wait prior to taking Can discuss with the pharmacist  Educated that the zoster and the shingrix are 2 different vaccines and she can take the Shingrix at any time   Abnormal Screens  none  Referrals  None   Patient concerns; None   Nurse  Concerns; nonr  Next PCP apt Was today / Other TBS     I have personally reviewed and noted the following in the patient's chart:   . Medical and social history . Use of alcohol, tobacco or illicit drugs  . Current medications and supplements . Functional ability and status . Nutritional status . Physical activity . Advanced directives . List of other physicians . Hospitalizations, surgeries, and ER visits in previous 12 months . Vitals . Screenings to include cognitive, depression, and falls . Referrals and appointments  In addition, I have reviewed and discussed with patient certain preventive protocols, quality metrics, and best practice recommendations. A written personalized care plan for preventive services as well as general preventive health recommendations were provided to patient.     Montine Circle, RN  10/28/2016  [name]., DO

## 2016-11-21 ENCOUNTER — Ambulatory Visit (INDEPENDENT_AMBULATORY_CARE_PROVIDER_SITE_OTHER): Payer: Medicare Other | Admitting: *Deleted

## 2016-11-21 DIAGNOSIS — Z7901 Long term (current) use of anticoagulants: Secondary | ICD-10-CM | POA: Diagnosis not present

## 2016-11-21 DIAGNOSIS — Z5181 Encounter for therapeutic drug level monitoring: Secondary | ICD-10-CM

## 2016-11-21 DIAGNOSIS — I2699 Other pulmonary embolism without acute cor pulmonale: Secondary | ICD-10-CM | POA: Diagnosis not present

## 2016-11-21 LAB — POCT INR: INR: 2.3

## 2016-11-21 NOTE — Patient Instructions (Addendum)
Today take 1.5 tablets then continue taking 1 tablet daily except 1/2 tablet on Tuesdays,  Thursdays, and Saturdays.  Recheck in 4 weeks. Call with any new meds or procedures [phone]

## 2016-11-29 ENCOUNTER — Ambulatory Visit: Payer: Medicare Other | Admitting: Sports Medicine

## 2016-12-19 ENCOUNTER — Ambulatory Visit (INDEPENDENT_AMBULATORY_CARE_PROVIDER_SITE_OTHER): Payer: Medicare Other | Admitting: *Deleted

## 2016-12-19 DIAGNOSIS — Z5181 Encounter for therapeutic drug level monitoring: Secondary | ICD-10-CM

## 2016-12-19 DIAGNOSIS — Z7901 Long term (current) use of anticoagulants: Secondary | ICD-10-CM

## 2016-12-19 DIAGNOSIS — I2699 Other pulmonary embolism without acute cor pulmonale: Secondary | ICD-10-CM

## 2016-12-19 LAB — POCT INR: INR: 4.1

## 2017-01-12 ENCOUNTER — Encounter: Payer: Self-pay | Admitting: Family Medicine

## 2017-01-13 ENCOUNTER — Ambulatory Visit (INDEPENDENT_AMBULATORY_CARE_PROVIDER_SITE_OTHER): Payer: Medicare Other | Admitting: *Deleted

## 2017-01-13 DIAGNOSIS — Z7901 Long term (current) use of anticoagulants: Secondary | ICD-10-CM | POA: Diagnosis not present

## 2017-01-13 DIAGNOSIS — Z5181 Encounter for therapeutic drug level monitoring: Secondary | ICD-10-CM

## 2017-01-13 DIAGNOSIS — I2699 Other pulmonary embolism without acute cor pulmonale: Secondary | ICD-10-CM

## 2017-01-13 LAB — POCT INR: INR: 3.7

## 2017-01-13 NOTE — Patient Instructions (Signed)
Description   Hold today's dose then start taking 1/2 tablet daily except 1 tablet on Monday, Wednesday, Friday.   Recheck in 2 weeks. Call with any new meds or procedures [phone]

## 2017-01-26 ENCOUNTER — Ambulatory Visit (INDEPENDENT_AMBULATORY_CARE_PROVIDER_SITE_OTHER): Payer: Medicare Other | Admitting: *Deleted

## 2017-01-26 DIAGNOSIS — Z7901 Long term (current) use of anticoagulants: Secondary | ICD-10-CM | POA: Diagnosis not present

## 2017-01-26 DIAGNOSIS — Z5181 Encounter for therapeutic drug level monitoring: Secondary | ICD-10-CM

## 2017-01-26 DIAGNOSIS — I2699 Other pulmonary embolism without acute cor pulmonale: Secondary | ICD-10-CM | POA: Diagnosis not present

## 2017-01-26 LAB — POCT INR: INR: 1.5

## 2017-01-26 MED ORDER — WARFARIN SODIUM 5 MG PO TABS: ORAL_TABLET | ORAL | 1 refills | Status: DC

## 2017-01-26 NOTE — Patient Instructions (Signed)
Description   Today 1 tablet and tomorrow take 1.5 tablets then continue taking 1/2 tablet daily except 1 tablet on Monday, Wednesday, Friday.   Recheck in 1 week. Call with any new meds or procedures [phone]

## 2017-01-30 ENCOUNTER — Ambulatory Visit (INDEPENDENT_AMBULATORY_CARE_PROVIDER_SITE_OTHER): Payer: Medicare Other | Admitting: Family Medicine

## 2017-01-30 ENCOUNTER — Encounter: Payer: Self-pay | Admitting: Family Medicine

## 2017-01-30 VITALS — BP 128/80 | HR 80 | Temp 99.2°F | Wt 173.2 lb

## 2017-01-30 DIAGNOSIS — J069 Acute upper respiratory infection, unspecified: Secondary | ICD-10-CM | POA: Diagnosis not present

## 2017-01-30 DIAGNOSIS — B9789 Other viral agents as the cause of diseases classified elsewhere: Secondary | ICD-10-CM | POA: Diagnosis not present

## 2017-01-30 MED ORDER — HYDROCODONE-HOMATROPINE 5-1.5 MG/5ML PO SYRP: 5.0000 mL | ORAL_SOLUTION | Freq: Every evening | ORAL | 0 refills | Status: DC | PRN

## 2017-01-30 NOTE — Patient Instructions (Addendum)
Upper Respiratory Infection, Adult Most upper respiratory infections (URIs) are caused by a virus. A URI affects the nose, throat, and upper air passages. The most common type of URI is often called "the common cold." Follow these instructions at home:  Take medicines only as told by your doctor.  Gargle warm saltwater or take cough drops to comfort your throat as told by your doctor.  Use a warm mist humidifier or inhale steam from a shower to increase air moisture. This may make it easier to breathe.  Drink enough fluid to keep your pee (urine) clear or pale yellow.  Eat soups and other clear broths.  Have a healthy diet.  Rest as needed.  Go back to work when your fever is gone or your doctor says it is okay. ? You may need to stay home longer to avoid giving your URI to others. ? You can also wear a face mask and wash your hands often to prevent spread of the virus.  Use your inhaler more if you have asthma.  Do not use any tobacco products, including cigarettes, chewing tobacco, or electronic cigarettes. If you need help quitting, ask your doctor. Contact a doctor if:  You are getting worse, not better.  Your symptoms are not helped by medicine.  You have chills.  You are getting more short of breath.  You have brown or red mucus.  You have yellow or brown discharge from your nose.  You have pain in your face, especially when you bend forward.  You have a fever.  You have puffy (swollen) neck glands.  You have pain while swallowing.  You have white areas in the back of your throat. Get help right away if:  You have very bad or constant: ? Headache. ? Ear pain. ? Pain in your forehead, behind your eyes, and over your cheekbones (sinus pain). ? Chest pain.  You have long-lasting (chronic) lung disease and any of the following: ? Wheezing. ? Long-lasting cough. ? Coughing up blood. ? A change in your usual mucus.  You have a stiff neck.  You have  changes in your: ? Vision. ? Hearing. ? Thinking. ? Mood. This information is not intended to replace advice given to you by your health care provider. Make sure you discuss any questions you have with your health care provider. Document Released: 06/08/2007 Document Revised: 08/23/2015 Document Reviewed: 03/27/2013 Elsevier Interactive Patient Education  2018 Elsevier Inc.  

## 2017-01-30 NOTE — Progress Notes (Signed)
Subjective:    Patient ID: Hannah [name], female    DOB: 1940/11/02, 77 y.o.   MRN: [id number]  Chief Complaint  Patient presents with  . Acute Visit    HPI Patient was seen today for acute concern.  Patient endorses dry cough, runny nose that started on Friday night.  Patient also endorses subjective fever and sore throat.  Patient states she has been gargling so now her throat is no longer sore.  Patient is also been taking Tylenol and drinking a hot lemon drink.  Patient denies any specific sick contacts but endorses being around people frequently.  Patient states she just wanted to make sure she "did not have the flu or something".  Past Medical History:  Diagnosis Date  . Acute massive pulmonary embolism (HCC) 07/13/2012   Massive PE w/ PEA arrest 07/13/12 >TNK >IVC filter >discharged on comadin    . Diabetes mellitus, type 2 (HCC) 07/15/2012   with peripheral neuropathy  . Hyperlipemia 07/14/2012  . Hypertension 07/14/2012  . Large bowel stricture (HCC)    s/p colectomy in 2016  . Osteoarthritis    s/p hip and knee replacements  . Thyroid disease     Allergies  Allergen Reactions  . Augmentin [Amoxicillin-Pot Clavulanate] Other (See Comments)    Severe vaginal itching  . Tranxene [Clorazepate] Itching    ROS General: Denies chills, night sweats, changes in weight, changes in appetite  +subjective fever HEENT: Denies headaches, ear pain, changes in vision   + rhinorrhea, sore throat CV: Denies CP, palpitations, SOB, orthopnea Pulm: Denies SOB, wheezing  +cough GI: Denies abdominal pain, nausea, vomiting, diarrhea, constipation GU: Denies dysuria, hematuria, frequency, vaginal discharge Msk: Denies muscle cramps, joint pains Neuro: Denies weakness, numbness, tingling Skin: Denies rashes, bruising Psych: Denies depression, anxiety, hallucinations     Objective:    Blood pressure 128/80, pulse 80, temperature 99.2 F (37.3 C), temperature source Oral, weight 173 lb 3.2 oz  (78.6 kg), SpO2 99 %.   Gen. Pleasant, well-nourished, in no distress, normal affect  HEENT: [place]/AT, face symmetric, no scleral icterus, PERRLA, nares patent with clear drainage, pharynx with postnasal drainage, no erythema or exudate.  TMs full bilaterally. Lungs: no accessory muscle use, CTAB, no wheezes or rales Cardiovascular: RRR, no m/r/g, no peripheral edema Abdomen: BS present, soft, NT/ND Neuro:  A&Ox3, CN II-XII intact, normal gait    Wt Readings from Last 3 Encounters:  01/30/17 173 lb 3.2 oz (78.6 kg)  10/28/16 167 lb 9.6 oz (76 kg)  07/26/16 171 lb 1.6 oz (77.6 kg)    Lab Results  Component Value Date   WBC 9.1 08/04/2016   HGB 12.4 08/04/2016   HCT 39.9 08/04/2016   PLT 278.0 08/04/2016   GLUCOSE 92 08/04/2016   CHOL 162 12/11/2015   TRIG 65.0 12/11/2015   HDL 73.90 12/11/2015   LDLCALC 75 12/11/2015   ALT 12 (L) 03/10/2016   AST 16 03/10/2016   NA 139 08/04/2016   K 4.4 08/04/2016   CL 104 08/04/2016   CREATININE 0.98 08/04/2016   BUN 19 08/04/2016   CO2 26 08/04/2016   TSH 1.38 08/04/2016   INR 1.5 01/26/2017   HGBA1C 6.7 (H) 08/04/2016   MICROALBUR 6.2 (H) 04/10/2015    Assessment/Plan:  Viral URI with cough  -Supportive care.  Continue gargling with warm salt water or Chloraseptic spray.  Continue Tylenol for pain/discomfort/fever. -Patient advised some cold medicines/cough syrups may increase her FSBS. - Plan: HYDROcodone-homatropine (HYCODAN) 5-1.5 MG/5ML syrup F/u  prn or if symptoms become worse.   Abbe Amsterdam, MD

## 2017-02-02 ENCOUNTER — Ambulatory Visit (INDEPENDENT_AMBULATORY_CARE_PROVIDER_SITE_OTHER): Payer: Medicare Other

## 2017-02-02 DIAGNOSIS — I2699 Other pulmonary embolism without acute cor pulmonale: Secondary | ICD-10-CM

## 2017-02-02 DIAGNOSIS — Z5181 Encounter for therapeutic drug level monitoring: Secondary | ICD-10-CM

## 2017-02-02 DIAGNOSIS — Z7901 Long term (current) use of anticoagulants: Secondary | ICD-10-CM

## 2017-02-02 LAB — POCT INR: INR: 2.3

## 2017-02-02 NOTE — Patient Instructions (Signed)
Description   Take 1 tablet today, then start taking 1 tablet daily except 1/2 tablet on Tuesdays, Thursdays and Saturdays.   Recheck in 2 weeks. Call with any new meds or procedures [phone]

## 2017-02-06 ENCOUNTER — Emergency Department (HOSPITAL_COMMUNITY): Payer: Medicare Other

## 2017-02-06 ENCOUNTER — Other Ambulatory Visit: Payer: Self-pay

## 2017-02-06 ENCOUNTER — Emergency Department (HOSPITAL_COMMUNITY)
Admission: EM | Admit: 2017-02-06 | Discharge: 2017-02-06 | Disposition: A | Discharge status: Home / Self Care | Payer: Medicare Other | Attending: Emergency Medicine | Admitting: Emergency Medicine

## 2017-02-06 ENCOUNTER — Encounter (HOSPITAL_COMMUNITY): Payer: Self-pay | Admitting: *Deleted

## 2017-02-06 DIAGNOSIS — E119 Type 2 diabetes mellitus without complications: Secondary | ICD-10-CM | POA: Insufficient documentation

## 2017-02-06 DIAGNOSIS — E039 Hypothyroidism, unspecified: Secondary | ICD-10-CM | POA: Diagnosis not present

## 2017-02-06 DIAGNOSIS — Z96642 Presence of left artificial hip joint: Secondary | ICD-10-CM | POA: Diagnosis not present

## 2017-02-06 DIAGNOSIS — Z87891 Personal history of nicotine dependence: Secondary | ICD-10-CM | POA: Diagnosis not present

## 2017-02-06 DIAGNOSIS — Z471 Aftercare following joint replacement surgery: Secondary | ICD-10-CM | POA: Diagnosis not present

## 2017-02-06 DIAGNOSIS — Z79899 Other long term (current) drug therapy: Secondary | ICD-10-CM | POA: Insufficient documentation

## 2017-02-06 DIAGNOSIS — M25552 Pain in left hip: Secondary | ICD-10-CM | POA: Diagnosis present

## 2017-02-06 DIAGNOSIS — M791 Myalgia, unspecified site: Secondary | ICD-10-CM | POA: Diagnosis not present

## 2017-02-06 DIAGNOSIS — I10 Essential (primary) hypertension: Secondary | ICD-10-CM | POA: Insufficient documentation

## 2017-02-06 DIAGNOSIS — M5432 Sciatica, left side: Secondary | ICD-10-CM | POA: Diagnosis not present

## 2017-02-06 DIAGNOSIS — Z7901 Long term (current) use of anticoagulants: Secondary | ICD-10-CM | POA: Insufficient documentation

## 2017-02-06 MED ORDER — METHOCARBAMOL 500 MG PO TABS: 500.0000 mg | ORAL_TABLET | Freq: Two times a day (BID) | ORAL | 0 refills | Status: DC

## 2017-02-06 MED ORDER — HYDROCODONE-ACETAMINOPHEN 5-325 MG PO TABS: 2.0000 | ORAL_TABLET | ORAL | 0 refills | Status: DC | PRN

## 2017-02-06 MED ORDER — PREDNISONE 50 MG PO TABS: 50.0000 mg | ORAL_TABLET | Freq: Every day | ORAL | 0 refills | Status: DC

## 2017-02-06 NOTE — Discharge Instructions (Signed)
Return to the ED with any concerns including weakness of legs, worsening pain, fever/chills, decreaesed level of alertness/lethargy, or any other alarming symptoms

## 2017-02-06 NOTE — ED Triage Notes (Signed)
Pt reports hx of left hip surgeries. Onset yesterday of posterior left hip pain that radiates down entire left leg. Denies any injury.

## 2017-02-06 NOTE — ED Provider Notes (Signed)
[facility] EMERGENCY DEPARTMENT Provider Note   CSN: [id number] Arrival date & time: 02/06/17  1607     History   Chief Complaint Chief Complaint  Patient presents with  . Leg Pain    HPI Hannah [name] is a 77 y.o. female.  HPI  Patient presenting with complaint of left posterior hip and buttock pain with some radiation down her left leg.  She has a history of PE and is on Coumadin.  She has had a history of hip and knee replacements with the last hip replacement being approximately 30 years ago.  She states the pain began 2 days ago.  She does not recall any change in activities or injury that precipitated the pain.  She states pain was worse last night.  She has been taking Tylenol without much relief.  There is no leg swelling.  There is been no fall or trauma.  Pain is worse with movement, palpation, and certain positions.  She has no numbness or weakness of her leg.  There are no other associated systemic symptoms, there are no other alleviating or modifying factors.   Patient had an INR of 2.3 drawn 4 days ago. No dysuria  Past Medical History:  Diagnosis Date  . Acute massive pulmonary embolism (HCC) 07/13/2012   Massive PE w/ PEA arrest 07/13/12 >TNK >IVC filter >discharged on comadin    . Diabetes mellitus, type 2 (HCC) 07/15/2012   with peripheral neuropathy  . Hyperlipemia 07/14/2012  . Hypertension 07/14/2012  . Large bowel stricture (HCC)    s/p colectomy in 2016  . Osteoarthritis    s/p hip and knee replacements  . Thyroid disease     Patient Active Problem List   Diagnosis Date Noted  . Pulmonary hypertension (HCC) 07/26/2016  . GI bleed 03/10/2016  . History of DVT in adulthood 03/10/2016  . Acute blood loss anemia 03/10/2016  . Gastrointestinal hemorrhage   . Diabetes mellitus with complication (HCC)   . Encounter for therapeutic drug monitoring 05/04/2015  . Neuropathy 02/12/2015  . Osteoarthritis 08/12/2014  . Normocytic anemia 06/08/2014   . Hypothyroid 06/06/2013  . Diabetes mellitus with neurological manifestations, controlled (HCC) 06/06/2013  . Long term current use of anticoagulant therapy 07/27/2012    Past Surgical History:  Procedure Laterality Date  . COLONOSCOPY N/A 03/12/2016   Procedure: COLONOSCOPY;  Surgeon: Dorena Cookey, MD;  Location: [facility] ENDOSCOPY;  Service: Endoscopy;  Laterality: N/A;  . COLOSTOMY N/A 06/03/2014   Procedure: COLOSTOMY;  Surgeon: Harriette Bouillon, MD;  Location: [facility] OR;  Service: General;  Laterality: N/A;  . FLEXIBLE SIGMOIDOSCOPY N/A 05/30/2014   Procedure: Arnell Sieving;  Surgeon: Jeani Hawking, MD;  Location: [facility] ENDOSCOPY;  Service: Endoscopy;  Laterality: N/A;  . INSERTION OF VENA CAVA FILTER N/A 07/16/2012   Procedure: INSERTION OF VENA CAVA FILTER;  Surgeon: [name], MD;  Location: [facility] CATH LAB;  Service: Cardiovascular;  Laterality: N/A;  . KNEE ARTHROSCOPY    . PARTIAL COLECTOMY N/A 06/03/2014   Procedure: PARTIAL COLECTOMY;  Surgeon: Harriette Bouillon, MD;  Location: [facility] OR;  Service: General;  Laterality: N/A;  . REDUCTION MAMMAPLASTY Bilateral   . SP ARTHRO HIP*L*      OB History    No data available       Home Medications    Prior to Admission medications   Medication Sig Start Date End Date Taking? Authorizing Provider  acetaminophen (TYLENOL) 500 MG tablet Take 500 mg by mouth every 6 (six) hours  as needed (pain).    [provider]  HYDROcodone-acetaminophen (NORCO/VICODIN) 5-325 MG tablet Take 2 tablets by mouth every 4 (four) hours as needed. 02/06/17   Mabe, Latanya Maudlin, MD  HYDROcodone-homatropine (HYCODAN) 5-1.5 MG/5ML syrup Take 5 mLs by mouth at bedtime as needed for cough. 01/30/17   Deeann Saint, MD  levothyroxine (SYNTHROID, LEVOTHROID) 25 MCG tablet Take 1 tablet by mouth  daily before breakfast 10/28/16   [name], DO  lisinopril (PRINIVIL,ZESTRIL) 40 MG tablet Take 1 tablet (40 mg total) by mouth daily. 10/20/16   [name], DO    metFORMIN (GLUCOPHAGE) 1000 MG tablet TAKE ONE TABLET BY MOUTH WITH BREAKFAST AND TAKE ONE TABLET WITH DINNER 07/29/16   [name], DO  methocarbamol (ROBAXIN) 500 MG tablet Take 1 tablet (500 mg total) by mouth 2 (two) times daily. 02/06/17   Mabe, Latanya Maudlin, MD  metoprolol tartrate (LOPRESSOR) 25 MG tablet Take 1 tablet (25 mg total) by mouth 2 (two) times daily. 10/28/16   [name], DO  ONETOUCH DELICA LANCETS FINE MISC USE ONCE DAILY 05/05/16   [name], DO  predniSONE (DELTASONE) 50 MG tablet Take 1 tablet (50 mg total) by mouth daily. 02/06/17   Mabe, Latanya Maudlin, MD  simvastatin (ZOCOR) 20 MG tablet Take 1 tablet (20 mg total) by mouth at bedtime. 08/08/16   [name], DO  vitamin C (ASCORBIC ACID) 500 MG tablet Take 500 mg by mouth daily.     [provider]  warfarin (COUMADIN) 5 MG tablet Take as directed by [facility] Clinic 01/26/17   Pricilla Riffle, MD    Family History Family History  Problem Relation Age of Onset  . Breast cancer Mother     Social History Social History   Tobacco Use  . Smoking status: Former Smoker    Packs/day: 1.00    Years: 10.00    Pack years: 10.00    Types: Cigarettes    Last attempt to quit: 01/04/1968    Years since quitting: 49.1  . Smokeless tobacco: Never Used  Substance Use Topics  . Alcohol use: No  . Drug use: No     Allergies   Augmentin [amoxicillin-pot clavulanate] and Tranxene [clorazepate]   Review of Systems Review of Systems  ROS reviewed and all otherwise negative except for mentioned in HPI   Physical Exam Updated Vital Signs BP (!) 173/99 (BP Location: Right Arm)   Pulse 78   Temp 98.2 F (36.8 C) (Oral)   Resp 18   SpO2 98%  Vitals reviewed Physical Exam  Physical Examination: General appearance - alert, well appearing, and in no distress Mental status - alert, oriented to person, place, and time Eyes - no conjunctival injection, no scleral icterus Mouth - mucous membranes moist, pharynx normal  without lesions Chest - clear to auscultation, no wheezes, rales or rhonchi, symmetric air entry Heart - normal rate, regular rhythm, normal S1, S2, no murmurs, rubs, clicks or gallops Back exam - full range of motion, no midline tenderness to palpation, ttp over left sacral region and left buttock.  No CVA tenderness Extremities - peripheral pulses normal, no pedal edema, no clubbing or cyanosis Skin - normal coloration and turgor, no rashes,  Neuro - awake, alert, oriented x 3, strength 5/5 in extremities x 4, sensation intact  ED Treatments / Results  Labs (all labs ordered are listed, but only abnormal results are displayed) Labs Reviewed - No data to display  EKG  EKG Interpretation None       Radiology Dg Hip Unilat With Pelvis 2-3 Views Left  Result Date: 02/06/2017 CLINICAL DATA:  77 year old female with left hip pain extending down left leg starting yesterday. No reported injury. Initial encounter. EXAM: DG HIP (WITH OR WITHOUT PELVIS) 2-3V LEFT COMPARISON:  12/17/2015 CT.  12/16/2015 plain film exam. FINDINGS: Post total left hip replacement appearing similar to prior exams. Minimal lucency tip of femoral component may be normal finding rather than representing loosening. No acute osseous abnormality noted. Degenerative changes lower lumbar spine. Inferior vena cava filter is in place. Colostomy left lower quadrant. Bony overgrowth ilium unchanged. IMPRESSION: Post left hip replacement without acute abnormality noted. Prominent degenerative changes lumbar spine. Electronically Signed   By: Lacy Duverney M.D.   On: 02/06/2017 09:56    Procedures Procedures (including critical care time)  Medications Ordered in ED Medications - No data to display   Initial Impression / Assessment and Plan / ED Course  I have reviewed the triage vital signs and the nursing notes.  Pertinent labs & imaging results that were available during my care of the patient were reviewed by me and  considered in my medical decision making (see chart for details).   Patient presenting with complaint of left buttock pain which began 2 days ago there is some radiation down into the left.  She has no weakness or numbness of her leg.  She has had no trauma.  She has no dysuria.  She is on Coumadin for history of blood clot but INR was checked 4 days ago and was 2.3.  I suspect her symptoms are most likely due to sciatica.  She was given pain medication muscle relaxer and steroids.  She will arrange for close follow-up with her primary care doctor.  No signs or symptoms of cauda equina, no fever to suggest epidural abscess. Discharged with strict return precautions.  Pt agreeable with plan.   Final Clinical Impressions(s) / ED Diagnoses   Final diagnoses:  Sciatica of left side    ED Discharge Orders        Ordered    HYDROcodone-acetaminophen (NORCO/VICODIN) 5-325 MG tablet  Every 4 hours PRN     02/06/17 1235    methocarbamol (ROBAXIN) 500 MG tablet  2 times daily     02/06/17 1235    predniSONE (DELTASONE) 50 MG tablet  Daily     02/06/17 1235       Mabe, Latanya Maudlin, MD 02/06/17 1330

## 2017-02-07 ENCOUNTER — Ambulatory Visit (INDEPENDENT_AMBULATORY_CARE_PROVIDER_SITE_OTHER): Payer: Medicare Other | Admitting: Family Medicine

## 2017-02-07 ENCOUNTER — Encounter: Payer: Self-pay | Admitting: Family Medicine

## 2017-02-07 VITALS — BP 118/86 | HR 83 | Temp 97.9°F | Ht 67.0 in | Wt 168.3 lb

## 2017-02-07 DIAGNOSIS — L853 Xerosis cutis: Secondary | ICD-10-CM | POA: Diagnosis not present

## 2017-02-07 DIAGNOSIS — R0982 Postnasal drip: Secondary | ICD-10-CM | POA: Diagnosis not present

## 2017-02-07 DIAGNOSIS — M5430 Sciatica, unspecified side: Secondary | ICD-10-CM

## 2017-02-07 MED ORDER — TRIAMCINOLONE ACETONIDE 0.1 % EX CREA: 1.0000 "application " | TOPICAL_CREAM | Freq: Two times a day (BID) | CUTANEOUS | 0 refills | Status: DC

## 2017-02-07 NOTE — Patient Instructions (Signed)
BEFORE YOU LEAVE: -sciatica or low back exercises -follow up: CANCEL f/u later this month and schedule follow up next month   For the back pain: -use the medications if needed -heat or ice and gentle activity -topical sports creams also can be helpful  For the hands: -Aquaphor and triamcinilone cream 1-2 times daily for 1 week  For the chronic nasal dripping can try flonase - this is available over the counter.

## 2017-02-07 NOTE — Progress Notes (Signed)
HPI:  Acute visit for:  Sciatica: -seen in ER yesterday for L buttock pain radiating post L leg - per notes no neurodeficits, plain films hip without acute findings -treated with muscle relaxer, steroids and small amount norco by ER doc -today reports: did ice yesterday, doing much better today  - she has not taken any of the medications as is afraid of side effects -denies:weakness, numbness, pain today Hx Knee and Hip OA s/p hip and knee replacements: -uses [employer] transportation, cane -Saw Dr. Luiz Blare at [facility] orthopedics in the past  New concerns:  Dry skin on hands: -this winter Has tried many lotions -no lesions, redness or oozing  Nasal dripping: -clear nasal congestion -for weeks -no thick mucus, fevers, sinus pain, acid reflux  Has follow up chronic disease later this month.  ROS: See pertinent positives and negatives per HPI.  Past Medical History:  Diagnosis Date  . Acute massive pulmonary embolism (HCC) 07/13/2012   Massive PE w/ PEA arrest 07/13/12 >TNK >IVC filter >discharged on comadin    . Diabetes mellitus, type 2 (HCC) 07/15/2012   with peripheral neuropathy  . Hyperlipemia 07/14/2012  . Hypertension 07/14/2012  . Large bowel stricture (HCC)    s/p colectomy in 2016  . Osteoarthritis    s/p hip and knee replacements  . Thyroid disease     Past Surgical History:  Procedure Laterality Date  . COLONOSCOPY N/A 03/12/2016   Procedure: COLONOSCOPY;  Surgeon: Dorena Cookey, MD;  Location: [facility] ENDOSCOPY;  Service: Endoscopy;  Laterality: N/A;  . COLOSTOMY N/A 06/03/2014   Procedure: COLOSTOMY;  Surgeon: Harriette Bouillon, MD;  Location: [facility] OR;  Service: General;  Laterality: N/A;  . FLEXIBLE SIGMOIDOSCOPY N/A 05/30/2014   Procedure: Arnell Sieving;  Surgeon: Jeani Hawking, MD;  Location: [facility] ENDOSCOPY;  Service: Endoscopy;  Laterality: N/A;  . INSERTION OF VENA CAVA FILTER N/A 07/16/2012   Procedure: INSERTION OF VENA CAVA FILTER;  Surgeon: [name], MD;   Location: [facility] CATH LAB;  Service: Cardiovascular;  Laterality: N/A;  . KNEE ARTHROSCOPY    . PARTIAL COLECTOMY N/A 06/03/2014   Procedure: PARTIAL COLECTOMY;  Surgeon: Harriette Bouillon, MD;  Location: [facility] OR;  Service: General;  Laterality: N/A;  . REDUCTION MAMMAPLASTY Bilateral   . SP ARTHRO HIP*L*      Family History  Problem Relation Age of Onset  . Breast cancer Mother     Social History   Socioeconomic History  . Marital status: Widowed    Spouse name: None  . Number of children: None  . Years of education: None  . Highest education level: None  Social Needs  . Financial resource strain: None  . Food insecurity - worry: None  . Food insecurity - inability: None  . Transportation needs - medical: None  . Transportation needs - non-medical: None  Occupational History  . None  Tobacco Use  . Smoking status: Former Smoker    Packs/day: 1.00    Years: 10.00    Pack years: 10.00    Types: Cigarettes    Last attempt to quit: 01/04/1968    Years since quitting: 49.1  . Smokeless tobacco: Never Used  Substance and Sexual Activity  . Alcohol use: No  . Drug use: No  . Sexual activity: None  Other Topics Concern  . None  Social History Narrative   Lives alone.            Current Outpatient Medications:  .  acetaminophen (TYLENOL) 500 MG tablet, Take 500  mg by mouth every 6 (six) hours as needed (pain)., Disp: , Rfl:  .  levothyroxine (SYNTHROID, LEVOTHROID) 25 MCG tablet, Take 1 tablet by mouth  daily before breakfast, Disp: 90 tablet, Rfl: 1 .  lisinopril (PRINIVIL,ZESTRIL) 40 MG tablet, Take 1 tablet (40 mg total) by mouth daily., Disp: 90 tablet, Rfl: 1 .  metFORMIN (GLUCOPHAGE) 1000 MG tablet, TAKE ONE TABLET BY MOUTH WITH BREAKFAST AND TAKE ONE TABLET WITH DINNER, Disp: 180 tablet, Rfl: 1 .  metoprolol tartrate (LOPRESSOR) 25 MG tablet, Take 1 tablet (25 mg total) by mouth 2 (two) times daily., Disp: 180 tablet, Rfl: 1 .  ONETOUCH DELICA LANCETS FINE MISC, USE ONCE  DAILY, Disp: 100 each, Rfl: 3 .  simvastatin (ZOCOR) 20 MG tablet, Take 1 tablet (20 mg total) by mouth at bedtime., Disp: 90 tablet, Rfl: 3 .  vitamin C (ASCORBIC ACID) 500 MG tablet, Take 500 mg by mouth daily. , Disp: , Rfl:  .  warfarin (COUMADIN) 5 MG tablet, Take as directed by Coumadin Clinic, Disp: 90 tablet, Rfl: 1  EXAM:  Vitals:   02/07/17 1307  BP: 118/86  Pulse: 83  Temp: 97.9 F (36.6 C)    Body mass index is 26.36 kg/m.  GENERAL: vitals reviewed and listed above, alert, oriented, appears well hydrated and in no acute distress  HEENT: atraumatic, conjunttiva clear, no obvious abnormalities on inspection of external nose and ears  NECK: no obvious masses on inspection  LUNGS: clear to auscultation bilaterally, no wheezes, rales or rhonchi, good air movement  CV: HRRR, no peripheral edema  SKIN: dry skin hands  MS: moves all extremities without noticeable abnormality, gait is normal with cane, no TTP bony or o/w on exam, normal strength/sensitivity to light touch throughout in bilat LE, neg SLRT, CLRT, FABER, FADIR  PSYCH: pleasant and cooperative, no obvious depression or anxiety  ASSESSMENT AND PLAN:  Discussed the following assessment and plan:  Sciatica, unspecified laterality  Xerosis cutis  PND (post-nasal drip)  -for back, glad doing so much better - discussed risks/benefits all meds, use as needed, gentle home exercises, topical options and heat/ice as needed, return precuations -aquaphor and triam cr for dry skin -discussed causes chronic sinus congestion, opted to try INS -Patient advised to return or notify a doctor immediately if symptoms worsen or persist or new concerns arise.  Patient Instructions  BEFORE YOU LEAVE: -sciatica or low back exercises -follow up: CANCEL f/u later this month and schedule follow up next month   For the back pain: -use the medications if needed -heat or ice and gentle activity -topical sports creams also  can be helpful  For the hands: -Aquaphor and triamcinilone cream 1-2 times daily for 1 week  For the chronic nasal dripping can try flonase - this is available over the counter.      [name], DO

## 2017-02-07 NOTE — Addendum Note (Signed)
Addended by: [name] on: 02/07/2017 02:39 PM   Modules accepted: Orders

## 2017-02-08 ENCOUNTER — Telehealth: Payer: Self-pay | Admitting: Family Medicine

## 2017-02-08 ENCOUNTER — Other Ambulatory Visit: Payer: Self-pay | Admitting: *Deleted

## 2017-02-08 DIAGNOSIS — Z933 Colostomy status: Secondary | ICD-10-CM | POA: Diagnosis not present

## 2017-02-08 DIAGNOSIS — K56609 Unspecified intestinal obstruction, unspecified as to partial versus complete obstruction: Secondary | ICD-10-CM | POA: Diagnosis not present

## 2017-02-08 DIAGNOSIS — Z4801 Encounter for change or removal of surgical wound dressing: Secondary | ICD-10-CM | POA: Diagnosis not present

## 2017-02-08 DIAGNOSIS — E119 Type 2 diabetes mellitus without complications: Secondary | ICD-10-CM | POA: Diagnosis not present

## 2017-02-08 MED ORDER — METFORMIN HCL 1000 MG PO TABS: ORAL_TABLET | ORAL | 1 refills | Status: DC

## 2017-02-08 NOTE — Telephone Encounter (Signed)
Rx refilled per protocol- A1c 08/05/16  LOV 02/08/16

## 2017-02-08 NOTE — Telephone Encounter (Signed)
Copied from CRM [id number]. Topic: Quick Communication - See Telephone Encounter >> Feb 08, 2017  9:39 AM Arlyss Gandy, NT wrote: CRM for notification. See Telephone encounter for: Pt requesting refill of  metFORMIN (GLUCOPHAGE). Uses [facility] on [address]. Pt forgot to request yesterday at her appt.  02/08/17.

## 2017-02-16 ENCOUNTER — Ambulatory Visit (INDEPENDENT_AMBULATORY_CARE_PROVIDER_SITE_OTHER): Payer: Medicare Other | Admitting: *Deleted

## 2017-02-16 DIAGNOSIS — Z5181 Encounter for therapeutic drug level monitoring: Secondary | ICD-10-CM | POA: Diagnosis not present

## 2017-02-16 DIAGNOSIS — Z7901 Long term (current) use of anticoagulants: Secondary | ICD-10-CM | POA: Diagnosis not present

## 2017-02-16 DIAGNOSIS — I2699 Other pulmonary embolism without acute cor pulmonale: Secondary | ICD-10-CM

## 2017-02-16 LAB — POCT INR: INR: 3.5

## 2017-02-16 NOTE — Patient Instructions (Signed)
  Continue taking 1 tablet daily except 1/2 tablet on Tuesdays, Thursdays and Saturdays.   Recheck in 3 weeks. Call with any new meds or procedures [phone]

## 2017-02-23 ENCOUNTER — Encounter: Payer: Self-pay | Admitting: Internal Medicine

## 2017-02-28 ENCOUNTER — Ambulatory Visit: Payer: Medicare Other | Admitting: Family Medicine

## 2017-03-02 ENCOUNTER — Ambulatory Visit: Payer: Medicare Other | Admitting: Internal Medicine

## 2017-03-02 ENCOUNTER — Encounter: Payer: Self-pay | Admitting: Internal Medicine

## 2017-03-02 VITALS — BP 168/100 | HR 78 | Ht 67.0 in | Wt 171.4 lb

## 2017-03-02 DIAGNOSIS — M7989 Other specified soft tissue disorders: Secondary | ICD-10-CM | POA: Diagnosis not present

## 2017-03-02 DIAGNOSIS — I10 Essential (primary) hypertension: Secondary | ICD-10-CM

## 2017-03-02 DIAGNOSIS — Z7901 Long term (current) use of anticoagulants: Secondary | ICD-10-CM | POA: Diagnosis not present

## 2017-03-02 MED ORDER — FUROSEMIDE 40 MG PO TABS: ORAL_TABLET | ORAL | 6 refills | Status: DC

## 2017-03-02 MED ORDER — POTASSIUM CHLORIDE ER 10 MEQ PO TBCR: EXTENDED_RELEASE_TABLET | ORAL | 6 refills | Status: DC

## 2017-03-02 NOTE — Progress Notes (Signed)
HPI Patient is a 77 yo who  has history of PE  Followed in pumonary clnic   She remains on coumadin  The pt has history of CP   Echo in 2015 LVEF and RVEF normal  Normal PAP   The patient denies CP  Denies dizziness.  Breathing has improved.    Allergies  Allergen Reactions  . Augmentin [Amoxicillin-Pot Clavulanate] Other (See Comments)    Severe vaginal itching  . Penicillins   . Tranxene [Clorazepate] Itching    Current Outpatient Medications  Medication Sig Dispense Refill  . acetaminophen (TYLENOL) 500 MG tablet Take 500 mg by mouth every 6 (six) hours as needed (pain).    Marland Kitchen levothyroxine (SYNTHROID, LEVOTHROID) 25 MCG tablet Take 1 tablet by mouth  daily before breakfast 90 tablet 1  . lisinopril (PRINIVIL,ZESTRIL) 40 MG tablet Take 1 tablet (40 mg total) by mouth daily. 90 tablet 1  . metFORMIN (GLUCOPHAGE) 1000 MG tablet TAKE ONE TABLET BY MOUTH WITH BREAKFAST AND TAKE ONE TABLET WITH DINNER 180 tablet 1  . metoprolol tartrate (LOPRESSOR) 25 MG tablet Take 1 tablet (25 mg total) by mouth 2 (two) times daily. 180 tablet 1  . ONETOUCH DELICA LANCETS FINE MISC USE ONCE DAILY 100 each 3  . simvastatin (ZOCOR) 20 MG tablet Take 1 tablet (20 mg total) by mouth at bedtime. 90 tablet 3  . triamcinolone cream (KENALOG) 0.1 % Apply 1 application topically 2 (two) times daily. 30 g 0  . vitamin C (ASCORBIC ACID) 500 MG tablet Take 500 mg by mouth daily.     Marland Kitchen warfarin (COUMADIN) 5 MG tablet Take as directed by Coumadin Clinic 90 tablet 1   No current facility-administered medications for this visit.     Past Medical History:  Diagnosis Date  . Acute massive pulmonary embolism (HCC) 07/13/2012   Massive PE w/ PEA arrest 07/13/12 >TNK >IVC filter >discharged on comadin    . Diabetes mellitus, type 2 (HCC) 07/15/2012   with peripheral neuropathy  . Hyperlipemia 07/14/2012  . Hypertension 07/14/2012  . Large bowel stricture (HCC)    s/p colectomy in 2016  . Osteoarthritis    s/p hip and  knee replacements  . Thyroid disease     Past Surgical History:  Procedure Laterality Date  . COLONOSCOPY N/A 03/12/2016   Procedure: COLONOSCOPY;  Surgeon: Dorena Cookey, MD;  Location: [facility] ENDOSCOPY;  Service: Endoscopy;  Laterality: N/A;  . COLOSTOMY N/A 06/03/2014   Procedure: COLOSTOMY;  Surgeon: Harriette Bouillon, MD;  Location: [facility] OR;  Service: General;  Laterality: N/A;  . FLEXIBLE SIGMOIDOSCOPY N/A 05/30/2014   Procedure: Arnell Sieving;  Surgeon: Jeani Hawking, MD;  Location: [facility] ENDOSCOPY;  Service: Endoscopy;  Laterality: N/A;  . INSERTION OF VENA CAVA FILTER N/A 07/16/2012   Procedure: INSERTION OF VENA CAVA FILTER;  Surgeon: [name], MD;  Location: [facility] CATH LAB;  Service: Cardiovascular;  Laterality: N/A;  . KNEE ARTHROSCOPY    . PARTIAL COLECTOMY N/A 06/03/2014   Procedure: PARTIAL COLECTOMY;  Surgeon: Harriette Bouillon, MD;  Location: [facility] OR;  Service: General;  Laterality: N/A;  . REDUCTION MAMMAPLASTY Bilateral   . SP ARTHRO HIP*L*      Family History  Problem Relation Age of Onset  . Breast cancer Mother     Social History   Socioeconomic History  . Marital status: Widowed    Spouse name: Not on file  . Number of children: Not on file  . Years of education: Not on file  .  Highest education level: Not on file  Social Needs  . Financial resource strain: Not on file  . Food insecurity - worry: Not on file  . Food insecurity - inability: Not on file  . Transportation needs - medical: Not on file  . Transportation needs - non-medical: Not on file  Occupational History  . Not on file  Tobacco Use  . Smoking status: Former Smoker    Packs/day: 1.00    Years: 10.00    Pack years: 10.00    Types: Cigarettes    Last attempt to quit: 01/04/1968    Years since quitting: 49.1  . Smokeless tobacco: Never Used  Substance and Sexual Activity  . Alcohol use: No  . Drug use: No  . Sexual activity: Not on file  Other Topics Concern  . Not on file  Social History  Narrative   Lives alone.           Review of Systems:  All systems reviewed.  They are negative to the above problem except as previously stated.  Vital Signs: BP (!) 168/100   Pulse 78   Ht 5\' 7"  (1.702 m)   Wt 171 lb 6.4 oz (77.7 kg)   BMI 26.85 kg/m   Physical Exam Patient is in AND HEENT:  Normocephalic, atraumatic. EOMI, PERRLA.  Neck: JVP is normal.  No bruits.  Lungs: clear to auscultation. No rales no wheezes.  Heart: Regular rate and rhythm. Normal S1, S2. No S3.   No significant murmurs. PMI not displaced.  Abdomen:  Supple, nontender. Normal bowel sounds. No masses. No hepatomegaly.  Extremities:   Good distal pulses throughout. No lower extremity edema.  Musculoskeletal :moving all extremities.  Neuro:   alert and oriented x3.  CN II-XII grossly intact.  EKG  SR 69  LVH  LAFB  Septal MI  Assessment and Plan:  1.  Pulm  Emboli  Patient to be on life long coumadin per her report.  Will follow year.ly  2  HTN  PT says BP at home is usually good  She is under increased stress as son sig k  Usually 110s to 130s  Will need to be follow  Will get labs from Dr [name] office (CBC, BMET)  Pt notes occasional swelling   Can give Rx for lasix 40 with K prn    Otherwise keep on same meds   F?U in 1 year

## 2017-03-02 NOTE — Patient Instructions (Signed)
Your physician has recommended you make the following change in your medication:  1.) lasix (furosemide) 40 mg take 1 tablet as needed for swelling 2.) potassium 10 mg take 1 tablet with each lasix dose  Your physician wants you to follow-up in: 1 year with Dr. Tenny Craw.  You will receive a reminder letter in the mail two months in advance. If you don't receive a letter, please call our office to schedule the follow-up appointment.

## 2017-03-07 DIAGNOSIS — Z933 Colostomy status: Secondary | ICD-10-CM | POA: Diagnosis not present

## 2017-03-07 DIAGNOSIS — K56609 Unspecified intestinal obstruction, unspecified as to partial versus complete obstruction: Secondary | ICD-10-CM | POA: Diagnosis not present

## 2017-03-07 DIAGNOSIS — E119 Type 2 diabetes mellitus without complications: Secondary | ICD-10-CM | POA: Diagnosis not present

## 2017-03-07 DIAGNOSIS — Z4801 Encounter for change or removal of surgical wound dressing: Secondary | ICD-10-CM | POA: Diagnosis not present

## 2017-03-07 NOTE — Progress Notes (Signed)
HPI:  Hannah [name] is a pleasant 77 y.o. here for follow up. Chronic medical problems summarized below were reviewed for changes.  She unfortunately suffered some symptoms consistent with likely sciatica in February.  She went to the emergency room for management.  Today reports doing well for the most part.  She has had some increased stress as her son was in the hospital, he now is doing better. On review of chart her blood pressure was significantly elevated at her recent cardiology appointment.  However, per review of their notes she reported then that it runs good at home in the 110-130s systolic per cardiology notes.  She opted to get her labs here.  Also, cardiology started Lasix 40 mg and potassium to use as needed for lower extremity edema.  She has not been taking this often.  Denies CP, dizziness, headaches, vision changes, SOB, DOE, treatment intolerance or new symptoms.  She does have chronic swelling in her ankles per her report. Due for labs - she is fasting, eye exam -reports this is scheduled for later this month  AWV 10/28/16  Hypothyroidism:  -on synthroid chronically   DM w/ neuropathy:  -current meds:metformin 1000mg  bid;used to take glipizide  Hx PE and cardiogenic shock, HTN, HLD:  -followed by Dr. Tenny Craw in Cardiology  -on coumadin managed by cards coumadin clinic, statin, metoprolol   History knee, hip and back issues: -Uses scat Transportation as has a lot of pain when walks more than several blocks, has difficulty boarding a bus or walking up hills -Uses cane to ambulate or walker -Has seen orthopedics in the past, [facility] -Flare of sciatic pain in 2019  ROS: See pertinent positives and negatives per HPI.  Past Medical History:  Diagnosis Date  . Acute massive pulmonary embolism (HCC) 07/13/2012   Massive PE w/ PEA arrest 07/13/12 >TNK >IVC filter >discharged on comadin    . Diabetes mellitus, type 2 (HCC) 07/15/2012   with peripheral  neuropathy  . Hyperlipemia 07/14/2012  . Hypertension 07/14/2012  . Large bowel stricture (HCC)    s/p colectomy in 2016  . Osteoarthritis    s/p hip and knee replacements  . Thyroid disease     Past Surgical History:  Procedure Laterality Date  . COLONOSCOPY N/A 03/12/2016   Procedure: COLONOSCOPY;  Surgeon: Dorena Cookey, MD;  Location: [facility] ENDOSCOPY;  Service: Endoscopy;  Laterality: N/A;  . COLOSTOMY N/A 06/03/2014   Procedure: COLOSTOMY;  Surgeon: Harriette Bouillon, MD;  Location: [facility] OR;  Service: General;  Laterality: N/A;  . FLEXIBLE SIGMOIDOSCOPY N/A 05/30/2014   Procedure: Arnell Sieving;  Surgeon: Jeani Hawking, MD;  Location: [facility] ENDOSCOPY;  Service: Endoscopy;  Laterality: N/A;  . INSERTION OF VENA CAVA FILTER N/A 07/16/2012   Procedure: INSERTION OF VENA CAVA FILTER;  Surgeon: [name], MD;  Location: [facility] CATH LAB;  Service: Cardiovascular;  Laterality: N/A;  . KNEE ARTHROSCOPY    . PARTIAL COLECTOMY N/A 06/03/2014   Procedure: PARTIAL COLECTOMY;  Surgeon: Harriette Bouillon, MD;  Location: [facility] OR;  Service: General;  Laterality: N/A;  . REDUCTION MAMMAPLASTY Bilateral   . SP ARTHRO HIP*L*      Family History  Problem Relation Age of Onset  . Breast cancer Mother     Social History   Socioeconomic History  . Marital status: Widowed    Spouse name: None  . Number of children: None  . Years of education: None  . Highest education level: None  Social Needs  . Physicist, medical  strain: None  . Food insecurity - worry: None  . Food insecurity - inability: None  . Transportation needs - medical: None  . Transportation needs - non-medical: None  Occupational History  . None  Tobacco Use  . Smoking status: Former Smoker    Packs/day: 1.00    Years: 10.00    Pack years: 10.00    Types: Cigarettes    Last attempt to quit: 01/04/1968    Years since quitting: 49.2  . Smokeless tobacco: Never Used  Substance and Sexual Activity  . Alcohol use: No  . Drug use: No   . Sexual activity: None  Other Topics Concern  . None  Social History Narrative   Lives alone.            Current Outpatient Medications:  .  acetaminophen (TYLENOL) 500 MG tablet, Take 500 mg by mouth every 6 (six) hours as needed (pain)., Disp: , Rfl:  .  levothyroxine (SYNTHROID, LEVOTHROID) 25 MCG tablet, Take 1 tablet by mouth  daily before breakfast, Disp: 90 tablet, Rfl: 1 .  lisinopril (PRINIVIL,ZESTRIL) 40 MG tablet, Take 1 tablet (40 mg total) by mouth daily., Disp: 90 tablet, Rfl: 1 .  metFORMIN (GLUCOPHAGE) 1000 MG tablet, TAKE ONE TABLET BY MOUTH WITH BREAKFAST AND TAKE ONE TABLET WITH DINNER, Disp: 180 tablet, Rfl: 1 .  metoprolol tartrate (LOPRESSOR) 25 MG tablet, Take 1 tablet (25 mg total) by mouth 2 (two) times daily., Disp: 180 tablet, Rfl: 1 .  ONETOUCH DELICA LANCETS FINE MISC, USE ONCE DAILY, Disp: 100 each, Rfl: 3 .  potassium chloride (K-DUR) 10 MEQ tablet, Take as directed--Take one tablet with each furosemide (Lasix) dose, Disp: 30 tablet, Rfl: 6 .  simvastatin (ZOCOR) 20 MG tablet, Take 1 tablet (20 mg total) by mouth at bedtime., Disp: 90 tablet, Rfl: 3 .  triamcinolone cream (KENALOG) 0.1 %, Apply 1 application topically 2 (two) times daily., Disp: 30 g, Rfl: 0 .  vitamin C (ASCORBIC ACID) 500 MG tablet, Take 500 mg by mouth daily. , Disp: , Rfl:  .  warfarin (COUMADIN) 5 MG tablet, Take as directed by Coumadin Clinic, Disp: 90 tablet, Rfl: 1 .  furosemide (LASIX) 20 MG tablet, Take 1 tablet (20 mg total) by mouth daily., Disp: 30 tablet, Rfl: 3  EXAM:  Vitals:   03/09/17 0753  BP: (!) 142/96  Pulse: 90  Temp: (!) 97.5 F (36.4 C)    Body mass index is 26.85 kg/m.  GENERAL: vitals reviewed and listed above, alert, oriented, appears well hydrated and in no acute distress  HEENT: atraumatic, conjunttiva clear, no obvious abnormalities on inspection of external nose and ears  NECK: no obvious masses on inspection  LUNGS: clear to auscultation  bilaterally, no wheezes, rales or rhonchi, good air movement  CV: HRRR, trace bilat ankle edema   MS: moves all extremities without noticeable abnormality  PSYCH: pleasant and cooperative, no obvious depression or anxiety  ASSESSMENT AND PLAN:  Discussed the following assessment and plan:  Diabetes mellitus with complication (HCC) - Plan: Hemoglobin A1c  Other specified hypothyroidism - Plan: TSH  Hypertension associated with diabetes (HCC) - Plan: Basic metabolic panel, CBC  Hyperlipidemia associated with type 2 diabetes mellitus (HCC) - Plan: Lipid panel  Lower extremity edema  Hypothyroidism, unspecified type  -She and I are both concerned about her blood pressure -We talked about various options for treatment/risks -Decided to start taking a low-dose of Lasix daily, 20 mg, this new dose was sent to  the pharmacy -Compression socks and the Lasix should help with the lower extremity edema -Also discussed a healthy diet and regular activity -Follow-up in 1 month to recheck the blood pressure and labs on Lasix -She has her regular labs today, his potassium on the low end we will also do potassium with Lasix -She reports she has her eye exam scheduled for later this month -Patient advised to return or notify a doctor immediately if symptoms worsen or persist or new concerns arise.  Patient Instructions  BEFORE YOU LEAVE: -Labs -follow up: 1 month for recheck on blood pressure and to check potassium  I sent a new dose of the Lasix to the pharmacy.  Please start taking Lasix 20 mg every morning.  Continue your other medications.  We have ordered labs or studies at this visit. It can take up to 1-2 weeks for results and processing. IF results require follow up or explanation, we will call you with instructions. Clinically stable results will be released to your [facility]. If you have not heard from [place] or cannot find your results in [facility] in 2 weeks please contact our office at  [phone].  If you are not yet signed up for [facility], please consider signing up.   We recommend the following healthy lifestyle for LIFE: 1) Small portions. But, make sure to get regular (at least 3 per day), healthy meals and small healthy snacks if needed.  2) Eat a healthy clean diet.   TRY TO EAT: -at least 5-7 servings of low sugar, colorful, and nutrient rich vegetables per day (not corn, potatoes or bananas.) -berries are the best choice if you wish to eat fruit (only eat small amounts if trying to reduce weight)  -lean meets (fish, white meat of chicken or [place]) -vegan proteins for some meals - beans or tofu, whole grains, nuts and seeds -Replace bad fats with good fats - good fats include: fish, nuts and seeds, canola oil, olive oil -small amounts of low fat or non fat dairy -small amounts of100 % whole grains - check the lables -drink plenty of water  AVOID: -SUGAR, sweets, anything with added sugar, corn syrup or sweeteners - must read labels as even foods advertised as "healthy" often are loaded with sugar -if you must have a sweetener, small amounts of stevia may be best -sweetened beverages and artificially sweetened beverages -simple starches (rice, bread, potatoes, pasta, chips, etc - small amounts of 100% whole grains are ok) -red meat, pork, butter -fried foods, fast food, processed food, excessive dairy, eggs and coconut.  3)Get at least 150 minutes of sweaty aerobic exercise per week.  4)Reduce stress - consider counseling, meditation and relaxation to balance other aspects of your life.           [name], DO

## 2017-03-09 ENCOUNTER — Encounter: Payer: Self-pay | Admitting: Family Medicine

## 2017-03-09 ENCOUNTER — Ambulatory Visit (INDEPENDENT_AMBULATORY_CARE_PROVIDER_SITE_OTHER): Payer: Medicare Other | Admitting: Family Medicine

## 2017-03-09 ENCOUNTER — Ambulatory Visit (INDEPENDENT_AMBULATORY_CARE_PROVIDER_SITE_OTHER): Payer: Medicare Other | Admitting: Pharmacist

## 2017-03-09 VITALS — BP 142/96 | HR 90 | Temp 97.5°F | Ht 67.0 in | Wt 171.4 lb

## 2017-03-09 DIAGNOSIS — E039 Hypothyroidism, unspecified: Secondary | ICD-10-CM | POA: Diagnosis not present

## 2017-03-09 DIAGNOSIS — Z7901 Long term (current) use of anticoagulants: Secondary | ICD-10-CM | POA: Diagnosis not present

## 2017-03-09 DIAGNOSIS — Z5181 Encounter for therapeutic drug level monitoring: Secondary | ICD-10-CM | POA: Diagnosis not present

## 2017-03-09 DIAGNOSIS — I152 Hypertension secondary to endocrine disorders: Secondary | ICD-10-CM

## 2017-03-09 DIAGNOSIS — E1159 Type 2 diabetes mellitus with other circulatory complications: Secondary | ICD-10-CM | POA: Diagnosis not present

## 2017-03-09 DIAGNOSIS — R600 Localized edema: Secondary | ICD-10-CM | POA: Diagnosis not present

## 2017-03-09 DIAGNOSIS — I10 Essential (primary) hypertension: Secondary | ICD-10-CM

## 2017-03-09 DIAGNOSIS — E118 Type 2 diabetes mellitus with unspecified complications: Secondary | ICD-10-CM | POA: Diagnosis not present

## 2017-03-09 DIAGNOSIS — E785 Hyperlipidemia, unspecified: Secondary | ICD-10-CM

## 2017-03-09 DIAGNOSIS — E1169 Type 2 diabetes mellitus with other specified complication: Secondary | ICD-10-CM | POA: Diagnosis not present

## 2017-03-09 DIAGNOSIS — I2699 Other pulmonary embolism without acute cor pulmonale: Secondary | ICD-10-CM | POA: Diagnosis not present

## 2017-03-09 DIAGNOSIS — E038 Other specified hypothyroidism: Secondary | ICD-10-CM | POA: Diagnosis not present

## 2017-03-09 LAB — CBC
HCT: 42.3 % (ref 36.0–46.0)
HEMOGLOBIN: 13.7 g/dL (ref 12.0–15.0)
MCHC: 32.3 g/dL (ref 30.0–36.0)
MCV: 88.4 fl (ref 78.0–100.0)
PLATELETS: 284 10*3/uL (ref 150.0–400.0)
RBC: 4.79 Mil/uL (ref 3.87–5.11)
RDW: 16.4 % — ABNORMAL HIGH (ref 11.5–15.5)
WBC: 7.9 10*3/uL (ref 4.0–10.5)

## 2017-03-09 LAB — BASIC METABOLIC PANEL
BUN: 18 mg/dL (ref 6–23)
CO2: 28 mEq/L (ref 19–32)
Calcium: 9.6 mg/dL (ref 8.4–10.5)
Chloride: 105 mEq/L (ref 96–112)
Creatinine, Ser: 0.88 mg/dL (ref 0.40–1.20)
GFR: 80.38 mL/min (ref >60.00)
Glucose, Bld: 111 mg/dL — ABNORMAL HIGH (ref 70–99)
POTASSIUM: 3.8 meq/L (ref 3.5–5.1)
SODIUM: 141 meq/L (ref 135–145)

## 2017-03-09 LAB — LIPID PANEL
CHOLESTEROL: 176 mg/dL (ref 0–200)
HDL: 78.9 mg/dL (ref >39.00)
LDL CALC: 81 mg/dL (ref 0–99)
NonHDL: 97.11
TRIGLYCERIDES: 82 mg/dL (ref 0.0–149.0)
Total CHOL/HDL Ratio: 2
VLDL: 16.4 mg/dL (ref 0.0–40.0)

## 2017-03-09 LAB — HEMOGLOBIN A1C: Hgb A1c MFr Bld: 6.4 % (ref 4.6–6.5)

## 2017-03-09 LAB — POCT INR: INR: 3.8

## 2017-03-09 LAB — TSH: TSH: 2.03 u[IU]/mL (ref 0.35–4.50)

## 2017-03-09 MED ORDER — FUROSEMIDE 20 MG PO TABS: 20.0000 mg | ORAL_TABLET | Freq: Every day | ORAL | 3 refills | Status: DC

## 2017-03-09 NOTE — Patient Instructions (Signed)
Description   Skip your Coumadin today, then continue taking 1 tablet daily except 1/2 tablet on Tuesdays, Thursdays and Saturdays.   Recheck in 3 weeks. Call with any new meds or procedures [phone]

## 2017-03-09 NOTE — Patient Instructions (Signed)
BEFORE YOU LEAVE: -Labs -follow up: 1 month for recheck on blood pressure and to check potassium  I sent a new dose of the Lasix to the pharmacy.  Please start taking Lasix 20 mg every morning.  Continue your other medications.  We have ordered labs or studies at this visit. It can take up to 1-2 weeks for results and processing. IF results require follow up or explanation, we will call you with instructions. Clinically stable results will be released to your [facility]. If you have not heard from [place] or cannot find your results in [facility] in 2 weeks please contact our office at [phone].  If you are not yet signed up for [facility], please consider signing up.   We recommend the following healthy lifestyle for LIFE: 1) Small portions. But, make sure to get regular (at least 3 per day), healthy meals and small healthy snacks if needed.  2) Eat a healthy clean diet.   TRY TO EAT: -at least 5-7 servings of low sugar, colorful, and nutrient rich vegetables per day (not corn, potatoes or bananas.) -berries are the best choice if you wish to eat fruit (only eat small amounts if trying to reduce weight)  -lean meets (fish, white meat of chicken or [place]) -vegan proteins for some meals - beans or tofu, whole grains, nuts and seeds -Replace bad fats with good fats - good fats include: fish, nuts and seeds, canola oil, olive oil -small amounts of low fat or non fat dairy -small amounts of100 % whole grains - check the lables -drink plenty of water  AVOID: -SUGAR, sweets, anything with added sugar, corn syrup or sweeteners - must read labels as even foods advertised as "healthy" often are loaded with sugar -if you must have a sweetener, small amounts of stevia may be best -sweetened beverages and artificially sweetened beverages -simple starches (rice, bread, potatoes, pasta, chips, etc - small amounts of 100% whole grains are ok) -red meat, pork, butter -fried foods, fast food, processed food,  excessive dairy, eggs and coconut.  3)Get at least 150 minutes of sweaty aerobic exercise per week.  4)Reduce stress - consider counseling, meditation and relaxation to balance other aspects of your life.

## 2017-03-14 ENCOUNTER — Encounter (HOSPITAL_COMMUNITY): Payer: Self-pay | Admitting: Emergency Medicine

## 2017-03-14 ENCOUNTER — Ambulatory Visit (INDEPENDENT_AMBULATORY_CARE_PROVIDER_SITE_OTHER): Payer: Medicare Other

## 2017-03-14 ENCOUNTER — Inpatient Hospital Stay (HOSPITAL_COMMUNITY)
Admission: EM | Admit: 2017-03-14 | Discharge: 2017-03-19 | DRG: 378 | Disposition: A | Discharge status: Home / Self Care | Payer: Medicare Other | Attending: Family Medicine | Admitting: Family Medicine

## 2017-03-14 ENCOUNTER — Encounter (HOSPITAL_COMMUNITY): Payer: Self-pay | Admitting: Physician Assistant

## 2017-03-14 ENCOUNTER — Other Ambulatory Visit: Payer: Self-pay

## 2017-03-14 ENCOUNTER — Ambulatory Visit (HOSPITAL_COMMUNITY)
Admission: EM | Admit: 2017-03-14 | Discharge: 2017-03-14 | Disposition: A | Discharge status: Home / Self Care | Payer: Medicare Other | Source: Home / Self Care | Attending: Family Medicine | Admitting: Family Medicine

## 2017-03-14 ENCOUNTER — Ambulatory Visit (INDEPENDENT_AMBULATORY_CARE_PROVIDER_SITE_OTHER): Payer: Medicare Other | Admitting: *Deleted

## 2017-03-14 DIAGNOSIS — R1011 Right upper quadrant pain: Secondary | ICD-10-CM | POA: Diagnosis not present

## 2017-03-14 DIAGNOSIS — I2699 Other pulmonary embolism without acute cor pulmonale: Secondary | ICD-10-CM | POA: Diagnosis not present

## 2017-03-14 DIAGNOSIS — I10 Essential (primary) hypertension: Secondary | ICD-10-CM | POA: Diagnosis present

## 2017-03-14 DIAGNOSIS — Z9049 Acquired absence of other specified parts of digestive tract: Secondary | ICD-10-CM

## 2017-03-14 DIAGNOSIS — Z7901 Long term (current) use of anticoagulants: Secondary | ICD-10-CM

## 2017-03-14 DIAGNOSIS — I5020 Unspecified systolic (congestive) heart failure: Secondary | ICD-10-CM | POA: Diagnosis present

## 2017-03-14 DIAGNOSIS — E1140 Type 2 diabetes mellitus with diabetic neuropathy, unspecified: Secondary | ICD-10-CM | POA: Diagnosis not present

## 2017-03-14 DIAGNOSIS — R0781 Pleurodynia: Secondary | ICD-10-CM | POA: Diagnosis not present

## 2017-03-14 DIAGNOSIS — Z933 Colostomy status: Secondary | ICD-10-CM | POA: Diagnosis not present

## 2017-03-14 DIAGNOSIS — E1149 Type 2 diabetes mellitus with other diabetic neurological complication: Secondary | ICD-10-CM | POA: Diagnosis not present

## 2017-03-14 DIAGNOSIS — E038 Other specified hypothyroidism: Secondary | ICD-10-CM | POA: Diagnosis not present

## 2017-03-14 DIAGNOSIS — K5731 Diverticulosis of large intestine without perforation or abscess with bleeding: Principal | ICD-10-CM | POA: Diagnosis present

## 2017-03-14 DIAGNOSIS — I82501 Chronic embolism and thrombosis of unspecified deep veins of right lower extremity: Secondary | ICD-10-CM | POA: Diagnosis not present

## 2017-03-14 DIAGNOSIS — E785 Hyperlipidemia, unspecified: Secondary | ICD-10-CM | POA: Diagnosis not present

## 2017-03-14 DIAGNOSIS — Z95828 Presence of other vascular implants and grafts: Secondary | ICD-10-CM

## 2017-03-14 DIAGNOSIS — Z5181 Encounter for therapeutic drug level monitoring: Secondary | ICD-10-CM | POA: Diagnosis not present

## 2017-03-14 DIAGNOSIS — Z86711 Personal history of pulmonary embolism: Secondary | ICD-10-CM | POA: Diagnosis not present

## 2017-03-14 DIAGNOSIS — R791 Abnormal coagulation profile: Secondary | ICD-10-CM | POA: Diagnosis present

## 2017-03-14 DIAGNOSIS — R109 Unspecified abdominal pain: Secondary | ICD-10-CM

## 2017-03-14 DIAGNOSIS — Z803 Family history of malignant neoplasm of breast: Secondary | ICD-10-CM

## 2017-03-14 DIAGNOSIS — I5032 Chronic diastolic (congestive) heart failure: Secondary | ICD-10-CM | POA: Diagnosis present

## 2017-03-14 DIAGNOSIS — K922 Gastrointestinal hemorrhage, unspecified: Secondary | ICD-10-CM | POA: Diagnosis not present

## 2017-03-14 DIAGNOSIS — I110 Hypertensive heart disease with heart failure: Secondary | ICD-10-CM | POA: Diagnosis not present

## 2017-03-14 DIAGNOSIS — K9401 Colostomy hemorrhage: Secondary | ICD-10-CM | POA: Diagnosis not present

## 2017-03-14 DIAGNOSIS — Z8674 Personal history of sudden cardiac arrest: Secondary | ICD-10-CM

## 2017-03-14 DIAGNOSIS — I160 Hypertensive urgency: Secondary | ICD-10-CM | POA: Diagnosis present

## 2017-03-14 DIAGNOSIS — D649 Anemia, unspecified: Secondary | ICD-10-CM | POA: Diagnosis not present

## 2017-03-14 DIAGNOSIS — D68318 Other hemorrhagic disorder due to intrinsic circulating anticoagulants, antibodies, or inhibitors: Secondary | ICD-10-CM | POA: Diagnosis not present

## 2017-03-14 DIAGNOSIS — E039 Hypothyroidism, unspecified: Secondary | ICD-10-CM | POA: Diagnosis present

## 2017-03-14 LAB — COMPREHENSIVE METABOLIC PANEL
ALBUMIN: 3.5 g/dL (ref 3.5–5.0)
ALK PHOS: 53 U/L (ref 38–126)
ALT: 11 U/L — AB (ref 14–54)
ANION GAP: 10 (ref 5–15)
AST: 23 U/L (ref 15–41)
BUN: 12 mg/dL (ref 6–20)
CALCIUM: 9.5 mg/dL (ref 8.9–10.3)
CHLORIDE: 105 mmol/L (ref 101–111)
CO2: 25 mmol/L (ref 22–32)
CREATININE: 1.07 mg/dL — AB (ref 0.44–1.00)
GFR calc non Af Amer: 49 mL/min — ABNORMAL LOW (ref >60)
GFR, EST AFRICAN AMERICAN: 57 mL/min — AB (ref >60)
GLUCOSE: 127 mg/dL — AB (ref 65–99)
Potassium: 3.9 mmol/L (ref 3.5–5.1)
Sodium: 140 mmol/L (ref 135–145)
Total Bilirubin: 0.6 mg/dL (ref 0.3–1.2)
Total Protein: 6.9 g/dL (ref 6.5–8.1)

## 2017-03-14 LAB — CBC WITH DIFFERENTIAL/PLATELET
BASOS PCT: 0 %
Basophils Absolute: 0 10*3/uL (ref 0.0–0.1)
EOS ABS: 0.1 10*3/uL (ref 0.0–0.7)
EOS PCT: 1 %
HCT: 40.8 % (ref 36.0–46.0)
HEMOGLOBIN: 12.9 g/dL (ref 12.0–15.0)
Lymphocytes Relative: 31 %
Lymphs Abs: 3.5 10*3/uL (ref 0.7–4.0)
MCH: 28.2 pg (ref 26.0–34.0)
MCHC: 31.6 g/dL (ref 30.0–36.0)
MCV: 89.3 fL (ref 78.0–100.0)
Monocytes Absolute: 0.5 10*3/uL (ref 0.1–1.0)
Monocytes Relative: 5 %
NEUTROS PCT: 63 %
Neutro Abs: 7.1 10*3/uL (ref 1.7–7.7)
PLATELETS: 301 10*3/uL (ref 150–400)
RBC: 4.57 MIL/uL (ref 3.87–5.11)
RDW: 15.7 % — ABNORMAL HIGH (ref 11.5–15.5)
WBC: 11.2 10*3/uL — AB (ref 4.0–10.5)

## 2017-03-14 LAB — POCT URINALYSIS DIP (DEVICE)
Bilirubin Urine: NEGATIVE
GLUCOSE, UA: NEGATIVE mg/dL
Ketones, ur: NEGATIVE mg/dL
NITRITE: NEGATIVE
PROTEIN: NEGATIVE mg/dL
SPECIFIC GRAVITY, URINE: 1.015 (ref 1.005–1.030)
UROBILINOGEN UA: 0.2 mg/dL (ref 0.0–1.0)
pH: 5 (ref 5.0–8.0)

## 2017-03-14 LAB — PROTIME-INR
INR: 3.47
PROTHROMBIN TIME: 34.6 s — AB (ref 11.4–15.2)

## 2017-03-14 LAB — POCT INR: INR: 5.3

## 2017-03-14 MED ORDER — ACETAMINOPHEN 325 MG PO TABS
650.0000 mg | ORAL_TABLET | Freq: Once | ORAL | Status: AC
  Administered 2017-03-14: 650 mg via ORAL

## 2017-03-14 MED ORDER — LIDOCAINE 5 % EX PTCH: 1.0000 | MEDICATED_PATCH | CUTANEOUS | 0 refills | Status: DC

## 2017-03-14 MED ORDER — ACETAMINOPHEN 325 MG PO TABS
ORAL_TABLET | ORAL | Status: AC
  Filled 2017-03-14: qty 2

## 2017-03-14 NOTE — Patient Instructions (Signed)
Description   Skip your Coumadin today and tomorrow then start taking 1/2 tablet daily except 1 tablet on Monday, Wednesday, and Friday.  Recheck in 1 week.  Call with any new meds or procedures [phone]

## 2017-03-14 NOTE — ED Triage Notes (Addendum)
Patient reports bloody stools in colostomy bag onset last night with mild right lateral abdominal pain , no emesis or diarrhea , denies fever or chills . She was seen at Coumadin clinic today and decreased her Coumadin dose .

## 2017-03-14 NOTE — Discharge Instructions (Signed)
Your x-ray was negative for pneumonia.  Urine without obvious infection.  Urine culture will be sent.  Lidoderm patch to help with the pain.  You can continue taking Tylenol for the pain as well.  Monitor for any rashes, spreading redness, increased warmth, worsening of symptoms, follow-up with your PCP for further evaluation needed.   The INR clinic will see you at 230 today.  Please give Dr. Lorelle Formosa call today, and make an appointment to be seen this week.  If experiencing worsening bleeding, weakness, dizziness, abdominal pain, nausea, vomiting, go to the emergency department for further evaluation.

## 2017-03-14 NOTE — ED Triage Notes (Signed)
Right flank/side pain started yesterday.    Blood in colostomy bag visible last night.

## 2017-03-14 NOTE — ED Provider Notes (Signed)
[facility]    CSN: [id number] Arrival date & time: 03/14/17  1005     History   Chief Complaint Chief Complaint  Patient presents with  . Abdominal Pain    HPI Hannah [name] is a 77 y.o. female.   77 year old female with history of PE on Coumadin, DM, HLD, HTN, large bowel stricture status post colectomy in 2016, comes in for 1 day history of right flank pain. Denies injury/trauma/fall. Patient states she felt the pain getting off of the bed, stabbing, associated with position.  States she tried Tylenol without relief.  She has also noticed some blood in her colostomy bag.  She denies nausea, vomiting.  Denies abdominal pain.  Denies fever, chills, night sweats.  Denies weakness, dizziness, syncope.  Denies URI symptoms such as cough, congestion, sore throat.  Denies dysuria, hematuria.  Patient states she was put on Lasix during her recent primary care visit, so has had frequency.  Her last INR was 3.8 above her 2.5-3.5 goal on 03/09/2017.       Past Medical History:  Diagnosis Date  . Acute massive pulmonary embolism (HCC) 07/13/2012   Massive PE w/ PEA arrest 07/13/12 >TNK >IVC filter >discharged on comadin    . Diabetes mellitus, type 2 (HCC) 07/15/2012   with peripheral neuropathy  . Hyperlipemia 07/14/2012  . Hypertension 07/14/2012  . Large bowel stricture (HCC)    s/p colectomy in 2016  . Osteoarthritis    s/p hip and knee replacements  . Thyroid disease     Patient Active Problem List   Diagnosis Date Noted  . Pulmonary hypertension (HCC) 07/26/2016  . GI bleed 03/10/2016  . History of DVT in adulthood 03/10/2016  . Acute blood loss anemia 03/10/2016  . Gastrointestinal hemorrhage   . Diabetes mellitus with complication (HCC)   . Encounter for therapeutic drug monitoring 05/04/2015  . Neuropathy 02/12/2015  . Osteoarthritis 08/12/2014  . Normocytic anemia 06/08/2014  . Hypothyroid 06/06/2013  . Diabetes mellitus with neurological manifestations,  controlled (HCC) 06/06/2013  . Long term current use of anticoagulant therapy 07/27/2012    Past Surgical History:  Procedure Laterality Date  . COLONOSCOPY N/A 03/12/2016   Procedure: COLONOSCOPY;  Surgeon: Dorena Cookey, MD;  Location: [facility] ENDOSCOPY;  Service: Endoscopy;  Laterality: N/A;  . COLOSTOMY N/A 06/03/2014   Procedure: COLOSTOMY;  Surgeon: Harriette Bouillon, MD;  Location: [facility] OR;  Service: General;  Laterality: N/A;  . FLEXIBLE SIGMOIDOSCOPY N/A 05/30/2014   Procedure: Arnell Sieving;  Surgeon: Jeani Hawking, MD;  Location: [facility] ENDOSCOPY;  Service: Endoscopy;  Laterality: N/A;  . INSERTION OF VENA CAVA FILTER N/A 07/16/2012   Procedure: INSERTION OF VENA CAVA FILTER;  Surgeon: [name], MD;  Location: [facility] CATH LAB;  Service: Cardiovascular;  Laterality: N/A;  . KNEE ARTHROSCOPY    . PARTIAL COLECTOMY N/A 06/03/2014   Procedure: PARTIAL COLECTOMY;  Surgeon: Harriette Bouillon, MD;  Location: [facility] OR;  Service: General;  Laterality: N/A;  . REDUCTION MAMMAPLASTY Bilateral   . SP ARTHRO HIP*L*      OB History    No data available       Home Medications    Prior to Admission medications   Medication Sig Start Date End Date Taking? Authorizing Provider  acetaminophen (TYLENOL) 500 MG tablet Take 500 mg by mouth every 6 (six) hours as needed (pain).    [provider]  furosemide (LASIX) 20 MG tablet Take 1 tablet (20 mg total) by mouth daily. 03/09/17  [name], DO  levothyroxine (SYNTHROID, LEVOTHROID) 25 MCG tablet Take 1 tablet by mouth  daily before breakfast 10/28/16   Kriste Basque R, DO  lidocaine (LIDODERM) 5 % Place 1 patch onto the skin daily. Remove & Discard patch within 12 hours or as directed by MD 03/14/17   Cathie Hoops, Amy V, PA-C  lisinopril (PRINIVIL,ZESTRIL) 40 MG tablet Take 1 tablet (40 mg total) by mouth daily. 10/20/16   [name], DO  metFORMIN (GLUCOPHAGE) 1000 MG tablet TAKE ONE TABLET BY MOUTH WITH BREAKFAST AND TAKE ONE TABLET WITH DINNER 02/08/17    [name], DO  metoprolol tartrate (LOPRESSOR) 25 MG tablet Take 1 tablet (25 mg total) by mouth 2 (two) times daily. 10/28/16   [name], DO  ONETOUCH DELICA LANCETS FINE MISC USE ONCE DAILY 05/05/16   [name], DO  potassium chloride (K-DUR) 10 MEQ tablet Take as directed--Take one tablet with each furosemide (Lasix) dose 03/02/17   Pricilla Riffle, MD  simvastatin (ZOCOR) 20 MG tablet Take 1 tablet (20 mg total) by mouth at bedtime. 08/08/16   [name], DO  triamcinolone cream (KENALOG) 0.1 % Apply 1 application topically 2 (two) times daily. 02/07/17   [name], DO  vitamin C (ASCORBIC ACID) 500 MG tablet Take 500 mg by mouth daily.     [provider]  warfarin (COUMADIN) 5 MG tablet Take as directed by Coumadin Clinic 01/26/17   Pricilla Riffle, MD    Family History Family History  Problem Relation Age of Onset  . Breast cancer Mother     Social History Social History   Tobacco Use  . Smoking status: Former Smoker    Packs/day: 1.00    Years: 10.00    Pack years: 10.00    Types: Cigarettes    Last attempt to quit: 01/04/1968    Years since quitting: 49.2  . Smokeless tobacco: Never Used  Substance Use Topics  . Alcohol use: No  . Drug use: No     Allergies   Augmentin [amoxicillin-pot clavulanate]; Penicillins; and Tranxene [clorazepate]   Review of Systems Review of Systems  Reason unable to perform ROS: See HPI as above.     Physical Exam Triage Vital Signs ED Triage Vitals  Enc Vitals Group     BP 03/14/17 1042 (!) 184/83     Pulse Rate 03/14/17 1042 74     Resp 03/14/17 1042 18     Temp 03/14/17 1042 97.9 F (36.6 C)     Temp Source 03/14/17 1042 Oral     SpO2 03/14/17 1042 98 %     Weight --      Height --      Head Circumference --      Peak Flow --      Pain Score 03/14/17 1040 8     Pain Loc --      Pain Edu? --      Excl. in GC? --    No data found.  Updated Vital Signs BP (!) 184/83 (BP Location: Right Arm)    Pulse 74   Temp 97.9 F (36.6 C) (Oral)   Resp 18   SpO2 98%   Physical Exam  Constitutional: She is oriented to person, place, and time. She appears well-developed and well-nourished. No distress.  HENT:  Head: Normocephalic and atraumatic.  Eyes: Conjunctivae are normal. Pupils are equal, round, and reactive to light.  Cardiovascular: Normal rate, regular rhythm  and normal heart sounds. Exam reveals no gallop and no friction rub.  No murmur heard. Pulmonary/Chest: Effort normal and breath sounds normal. No stridor. No respiratory distress. She has no wheezes. She has no rales.  Abdominal: Soft. Normal appearance and bowel sounds are normal. There is no tenderness. There is no rigidity, no rebound, no guarding and no CVA tenderness.  LLQ colostomy. Well healing colostomy site. Moderate stool in bag with mixture of bright red blood.   Musculoskeletal:  No rashes, erythema, increased warmth. Tenderness to palpation of right lateral lower ribs.   Neurological: She is alert and oriented to person, place, and time.     UC Treatments / Results  Labs (all labs ordered are listed, but only abnormal results are displayed) Labs Reviewed  POCT URINALYSIS DIP (DEVICE) - Abnormal; Notable for the following components:      Result Value   Hgb urine dipstick SMALL (*)    Leukocytes, UA TRACE (*)    All other components within normal limits  URINE CULTURE    EKG  EKG Interpretation None       Radiology Dg Chest 2 View  Result Date: 03/14/2017 CLINICAL DATA:  Acute right rib pain. EXAM: CHEST - 2 VIEW COMPARISON:  Radiograph of October 12, 2014. FINDINGS: The heart size and mediastinal contours are within normal limits. Both lungs are clear. No pneumothorax or pleural effusion is noted. Atherosclerosis of thoracic aorta is noted. The visualized skeletal structures are unremarkable. IMPRESSION: No active cardiopulmonary disease. Aortic Atherosclerosis (ICD10-I70.0). Electronically Signed    By: Lupita Raider, M.D.   On: 03/14/2017 11:45    Procedures Procedures (including critical care time)  Medications Ordered in UC Medications  acetaminophen (TYLENOL) tablet 650 mg (650 mg Oral Given 03/14/17 1328)     Initial Impression / Assessment and Plan / UC Course  I have reviewed the triage vital signs and the nursing notes.  Pertinent labs & imaging results that were available during my care of the patient were reviewed by me and considered in my medical decision making (see chart for details).    Case discussed with Dr Tracie Harrier. 77 year old female with history of PE on Coumadin, DM, HLD, HTN, large bowel stricture status post colectomy in 2016, comes in for 1 day history of right flank pain.  Pain is stabbing and positional. Denies urinary symptoms. Denies abdominal pain, nausea, vomiting. Has also noticed blood in colostomy bag today without weakness, dizziness, syncope. Recent INR 3.8 on 03/09/2017. Tenderness to palpation of right lateral low ribs without erythema, increased warmth, visible rash. Abdomen nontender to palpation. CXR negative for pneumonia/fractures. Urine with small blood/trace leukocytes and will send for culture. Will have patient use lidocaine patches for further pain control, and to follow up with PCP for further evaluation. Return precautions given.   Attempted to obtain INR to assess for blood in colostomy bag, but blood was determined unusable after transferring to lab. Contacted patient's INR clinic, will see patient at 2:30pm today. Given patient nontoxic in appearance, without tachycardia, in no acute distress, will have patient follow up as soon as possible with PCP for evaluation. Strict return precautions given. Patient expresses understanding and agrees to plan.  Case discussed with Dr Tracie Harrier, who agrees to plan.   Final Clinical Impressions(s) / UC Diagnoses   Final diagnoses:  Right flank pain  Bleeding from colostomy [facility])    ED Discharge  Orders        Ordered    lidocaine (LIDODERM) 5 %  Every 24 hours     03/14/17 1251         Belinda Fisher, [place] 03/14/17 1727

## 2017-03-15 ENCOUNTER — Encounter (HOSPITAL_COMMUNITY): Payer: Self-pay | Admitting: Family Medicine

## 2017-03-15 ENCOUNTER — Inpatient Hospital Stay (HOSPITAL_COMMUNITY)
Admit: 2017-03-15 | Discharge: 2017-03-15 | Disposition: A | Discharge status: Home / Self Care | Payer: Medicare Other | Attending: Family Medicine | Admitting: Family Medicine

## 2017-03-15 ENCOUNTER — Other Ambulatory Visit: Payer: Self-pay

## 2017-03-15 DIAGNOSIS — K5731 Diverticulosis of large intestine without perforation or abscess with bleeding: Secondary | ICD-10-CM | POA: Diagnosis present

## 2017-03-15 DIAGNOSIS — R791 Abnormal coagulation profile: Secondary | ICD-10-CM | POA: Diagnosis present

## 2017-03-15 DIAGNOSIS — K921 Melena: Secondary | ICD-10-CM | POA: Diagnosis not present

## 2017-03-15 DIAGNOSIS — R1011 Right upper quadrant pain: Secondary | ICD-10-CM | POA: Diagnosis present

## 2017-03-15 DIAGNOSIS — I5032 Chronic diastolic (congestive) heart failure: Secondary | ICD-10-CM | POA: Diagnosis not present

## 2017-03-15 DIAGNOSIS — M7989 Other specified soft tissue disorders: Secondary | ICD-10-CM | POA: Diagnosis not present

## 2017-03-15 DIAGNOSIS — Z803 Family history of malignant neoplasm of breast: Secondary | ICD-10-CM | POA: Diagnosis not present

## 2017-03-15 DIAGNOSIS — E785 Hyperlipidemia, unspecified: Secondary | ICD-10-CM | POA: Diagnosis present

## 2017-03-15 DIAGNOSIS — Z9049 Acquired absence of other specified parts of digestive tract: Secondary | ICD-10-CM | POA: Diagnosis not present

## 2017-03-15 DIAGNOSIS — Z8674 Personal history of sudden cardiac arrest: Secondary | ICD-10-CM | POA: Diagnosis not present

## 2017-03-15 DIAGNOSIS — E038 Other specified hypothyroidism: Secondary | ICD-10-CM | POA: Diagnosis not present

## 2017-03-15 DIAGNOSIS — E1149 Type 2 diabetes mellitus with other diabetic neurological complication: Secondary | ICD-10-CM | POA: Diagnosis present

## 2017-03-15 DIAGNOSIS — K922 Gastrointestinal hemorrhage, unspecified: Secondary | ICD-10-CM

## 2017-03-15 DIAGNOSIS — Z7901 Long term (current) use of anticoagulants: Secondary | ICD-10-CM | POA: Diagnosis not present

## 2017-03-15 DIAGNOSIS — Z933 Colostomy status: Secondary | ICD-10-CM | POA: Diagnosis not present

## 2017-03-15 DIAGNOSIS — I5020 Unspecified systolic (congestive) heart failure: Secondary | ICD-10-CM

## 2017-03-15 DIAGNOSIS — I10 Essential (primary) hypertension: Secondary | ICD-10-CM | POA: Diagnosis not present

## 2017-03-15 DIAGNOSIS — I110 Hypertensive heart disease with heart failure: Secondary | ICD-10-CM | POA: Diagnosis present

## 2017-03-15 DIAGNOSIS — E039 Hypothyroidism, unspecified: Secondary | ICD-10-CM | POA: Diagnosis present

## 2017-03-15 DIAGNOSIS — R109 Unspecified abdominal pain: Secondary | ICD-10-CM | POA: Diagnosis not present

## 2017-03-15 DIAGNOSIS — Z86711 Personal history of pulmonary embolism: Secondary | ICD-10-CM | POA: Diagnosis not present

## 2017-03-15 DIAGNOSIS — E1140 Type 2 diabetes mellitus with diabetic neuropathy, unspecified: Secondary | ICD-10-CM

## 2017-03-15 DIAGNOSIS — D649 Anemia, unspecified: Secondary | ICD-10-CM | POA: Diagnosis present

## 2017-03-15 DIAGNOSIS — Z95828 Presence of other vascular implants and grafts: Secondary | ICD-10-CM | POA: Diagnosis not present

## 2017-03-15 DIAGNOSIS — I82501 Chronic embolism and thrombosis of unspecified deep veins of right lower extremity: Secondary | ICD-10-CM | POA: Diagnosis present

## 2017-03-15 HISTORY — DX: Chronic diastolic (congestive) heart failure: I50.32

## 2017-03-15 HISTORY — DX: Unspecified systolic (congestive) heart failure: I50.20

## 2017-03-15 LAB — CBG MONITORING, ED
GLUCOSE-CAPILLARY: 89 mg/dL (ref 65–99)
GLUCOSE-CAPILLARY: 83 mg/dL (ref 65–99)
GLUCOSE-CAPILLARY: 98 mg/dL (ref 65–99)
Glucose-Capillary: 84 mg/dL (ref 65–99)

## 2017-03-15 LAB — HEMOGLOBIN AND HEMATOCRIT, BLOOD
HCT: 39.9 % (ref 36.0–46.0)
Hemoglobin: 12.5 g/dL (ref 12.0–15.0)

## 2017-03-15 LAB — GLUCOSE, CAPILLARY
Glucose-Capillary: 100 mg/dL — ABNORMAL HIGH (ref 65–99)
Glucose-Capillary: 133 mg/dL — ABNORMAL HIGH (ref 65–99)

## 2017-03-15 LAB — URINE CULTURE: Culture: <10000 — AB

## 2017-03-15 LAB — BASIC METABOLIC PANEL
ANION GAP: 10 (ref 5–15)
BUN: 14 mg/dL (ref 6–20)
CO2: 23 mmol/L (ref 22–32)
Calcium: 9.4 mg/dL (ref 8.9–10.3)
Chloride: 108 mmol/L (ref 101–111)
Creatinine, Ser: 0.91 mg/dL (ref 0.44–1.00)
GFR calc Af Amer: >60 mL/min (ref >60)
GFR calc non Af Amer: >60 mL/min (ref >60)
GLUCOSE: 96 mg/dL (ref 65–99)
POTASSIUM: 4 mmol/L (ref 3.5–5.1)
Sodium: 141 mmol/L (ref 135–145)

## 2017-03-15 LAB — PROTIME-INR
INR: 3.58
PROTHROMBIN TIME: 35.5 s — AB (ref 11.4–15.2)

## 2017-03-15 LAB — SAMPLE TO BLOOD BANK

## 2017-03-15 LAB — HEMOGLOBIN: HEMOGLOBIN: 12.4 g/dL (ref 12.0–15.0)

## 2017-03-15 LAB — HEMATOCRIT: HCT: 39.3 % (ref 36.0–46.0)

## 2017-03-15 MED ORDER — ONDANSETRON HCL 4 MG/2ML IJ SOLN: 4.0000 mg | Freq: Four times a day (QID) | INTRAMUSCULAR | Status: DC | PRN

## 2017-03-15 MED ORDER — SODIUM CHLORIDE 0.9% FLUSH
3.0000 mL | Freq: Two times a day (BID) | INTRAVENOUS | Status: DC
  Administered 2017-03-15 – 2017-03-19 (×10): 3 mL via INTRAVENOUS

## 2017-03-15 MED ORDER — ACETAMINOPHEN 325 MG PO TABS
650.0000 mg | ORAL_TABLET | Freq: Four times a day (QID) | ORAL | Status: DC | PRN
  Administered 2017-03-15 – 2017-03-19 (×3): 650 mg via ORAL
  Filled 2017-03-15 (×3): qty 2

## 2017-03-15 MED ORDER — METOPROLOL TARTRATE 25 MG PO TABS
25.0000 mg | ORAL_TABLET | Freq: Two times a day (BID) | ORAL | Status: DC
  Administered 2017-03-15 – 2017-03-19 (×8): 25 mg via ORAL
  Filled 2017-03-15 (×8): qty 1

## 2017-03-15 MED ORDER — SODIUM CHLORIDE 0.9 % IV SOLN
INTRAVENOUS | Status: DC
  Administered 2017-03-15: 04:00:00 via INTRAVENOUS

## 2017-03-15 MED ORDER — LEVOTHYROXINE SODIUM 25 MCG PO TABS
25.0000 ug | ORAL_TABLET | Freq: Every day | ORAL | Status: DC
  Administered 2017-03-16 – 2017-03-19 (×4): 25 ug via ORAL
  Filled 2017-03-15 (×4): qty 1

## 2017-03-15 MED ORDER — ONDANSETRON HCL 4 MG PO TABS: 4.0000 mg | ORAL_TABLET | Freq: Four times a day (QID) | ORAL | Status: DC | PRN

## 2017-03-15 MED ORDER — HYDRALAZINE HCL 20 MG/ML IJ SOLN
10.0000 mg | INTRAMUSCULAR | Status: DC | PRN
  Administered 2017-03-15: 10 mg via INTRAVENOUS
  Filled 2017-03-15: qty 1

## 2017-03-15 MED ORDER — LIDOCAINE 5 % EX PTCH
1.0000 | MEDICATED_PATCH | CUTANEOUS | Status: DC
  Administered 2017-03-15 – 2017-03-18 (×4): 1 via TRANSDERMAL
  Filled 2017-03-15 (×4): qty 1

## 2017-03-15 MED ORDER — LISINOPRIL 40 MG PO TABS
40.0000 mg | ORAL_TABLET | Freq: Every day | ORAL | Status: DC
  Administered 2017-03-15 – 2017-03-19 (×5): 40 mg via ORAL
  Filled 2017-03-15 (×5): qty 1

## 2017-03-15 MED ORDER — INSULIN ASPART 100 UNIT/ML ~~LOC~~ SOLN: 0.0000 [IU] | SUBCUTANEOUS | Status: DC

## 2017-03-15 MED ORDER — FENTANYL CITRATE (PF) 100 MCG/2ML IJ SOLN: 25.0000 ug | INTRAMUSCULAR | Status: DC | PRN

## 2017-03-15 MED ORDER — PANTOPRAZOLE SODIUM 40 MG IV SOLR
40.0000 mg | Freq: Two times a day (BID) | INTRAVENOUS | Status: DC
  Administered 2017-03-15 – 2017-03-19 (×10): 40 mg via INTRAVENOUS
  Filled 2017-03-15 (×10): qty 40

## 2017-03-15 MED ORDER — ASPIRIN 81 MG PO CHEW: 324.0000 mg | CHEWABLE_TABLET | Freq: Once | ORAL | Status: DC

## 2017-03-15 MED ORDER — ACETAMINOPHEN 650 MG RE SUPP: 650.0000 mg | Freq: Four times a day (QID) | RECTAL | Status: DC | PRN

## 2017-03-15 NOTE — H&P (Signed)
History and Physical    Hannah [name] ZOX:[id number] DOB: Feb 10, 1940 DOA: 03/14/2017  PCP: [name], DO   Patient coming from: Home  Chief Complaint: Blood in colostomy bag   HPI: Hannah [name] is a 76 y.o. female with medical history significant for history of DVT and PE on warfarin, type 2 diabetes mellitus, hypertension, and sigmoid stricture status post partial colectomy in 2016, now presenting to the emergency department for evaluation of blood in her colostomy bag.  Patient reports that she been in her usual state of health until developing pain in the right flank yesterday and then noting blood in her colostomy bag.  She reports mild nausea without vomiting, denies fevers or chills.  She denies chest pain, palpitations, or lightheadedness.  She has had similar pain in the right flank previously and reports that it is worse with certain movements.  She underwent colonoscopy 1 year ago and was found to have sigmoid and descending diverticula.  She was evaluated at urgent care for these complaints yesterday, noted to have INR of 5.3, and discharged home.  She has since been to her Coumadin clinic where she was instructed to hold her warfarin for 2 days.  Bleeding persisted and she presents to the ED.  ED Course: Upon arrival to the ED, patient is found to be chest x-ray is negative for acute cardiopulmonary disease and afebrile, saturating well on room air, hypertensive to 180/90, and vitals otherwise normal. Chemistry panel is notable for serum creatinine 1.07, slightly higher than priors.  CBC is notable for a leukocytosis to 11,200 and hemoglobin of 12.9, consistent with her apparent baseline.  INR is now 3.47.  Urinalysis is unremarkable.  Type and screen was performed in the emergency department.  She remains hypertensive, and in no apparent distress, and will be admitted to the telemetry unit for ongoing evaluation and management of acute GI bleed on warfarin.  Review of Systems:  All  other systems reviewed and apart from HPI, are negative.  Past Medical History:  Diagnosis Date  . Acute massive pulmonary embolism (HCC) 07/13/2012   Massive PE w/ PEA arrest 07/13/12 >TNK >IVC filter >discharged on comadin    . Diabetes mellitus, type 2 (HCC) 07/15/2012   with peripheral neuropathy  . Hyperlipemia 07/14/2012  . Hypertension 07/14/2012  . Large bowel stricture (HCC)    s/p colectomy in 2016  . Osteoarthritis    s/p hip and knee replacements  . Thyroid disease     Past Surgical History:  Procedure Laterality Date  . COLONOSCOPY N/A 03/12/2016   Procedure: COLONOSCOPY;  Surgeon: Dorena Cookey, MD;  Location: [facility] ENDOSCOPY;  Service: Endoscopy;  Laterality: N/A;  . COLOSTOMY N/A 06/03/2014   Procedure: COLOSTOMY;  Surgeon: Harriette Bouillon, MD;  Location: [facility] OR;  Service: General;  Laterality: N/A;  . FLEXIBLE SIGMOIDOSCOPY N/A 05/30/2014   Procedure: Arnell Sieving;  Surgeon: Jeani Hawking, MD;  Location: [facility] ENDOSCOPY;  Service: Endoscopy;  Laterality: N/A;  . INSERTION OF VENA CAVA FILTER N/A 07/16/2012   Procedure: INSERTION OF VENA CAVA FILTER;  Surgeon: [name], MD;  Location: [facility] CATH LAB;  Service: Cardiovascular;  Laterality: N/A;  . KNEE ARTHROSCOPY    . PARTIAL COLECTOMY N/A 06/03/2014   Procedure: PARTIAL COLECTOMY;  Surgeon: Harriette Bouillon, MD;  Location: [facility] OR;  Service: General;  Laterality: N/A;  . REDUCTION MAMMAPLASTY Bilateral   . SP ARTHRO HIP*L*       reports that she quit smoking about 49 years ago. Her  smoking use included cigarettes. She has a 10.00 pack-year smoking history. she has never used smokeless tobacco. She reports that she does not drink alcohol or use drugs.  Allergies  Allergen Reactions  . Augmentin [Amoxicillin-Pot Clavulanate] Other (See Comments)    Severe vaginal itching  . Penicillins   . Tranxene [Clorazepate] Itching    Family History  Problem Relation Age of Onset  . Breast cancer Mother      Prior to  Admission medications   Medication Sig Start Date End Date Taking? Authorizing Provider  acetaminophen (TYLENOL) 500 MG tablet Take 500 mg by mouth every 6 (six) hours as needed (pain).   Yes [provider]  furosemide (LASIX) 20 MG tablet Take 1 tablet (20 mg total) by mouth daily. 03/09/17  Yes [name], DO  levothyroxine (SYNTHROID, LEVOTHROID) 25 MCG tablet Take 1 tablet by mouth  daily before breakfast Patient taking differently: Take 25 mcg by mouth daily before breakfast.  10/28/16  Yes Kriste Basque R, DO  lisinopril (PRINIVIL,ZESTRIL) 40 MG tablet Take 1 tablet (40 mg total) by mouth daily. 10/20/16  Yes Kriste Basque R, DO  metFORMIN (GLUCOPHAGE) 1000 MG tablet TAKE ONE TABLET BY MOUTH WITH BREAKFAST AND TAKE ONE TABLET WITH DINNER 02/08/17  Yes Kriste Basque R, DO  metoprolol tartrate (LOPRESSOR) 25 MG tablet Take 1 tablet (25 mg total) by mouth 2 (two) times daily. 10/28/16  Yes Kriste Basque R, DO  potassium chloride (K-DUR) 10 MEQ tablet Take as directed--Take one tablet with each furosemide (Lasix) dose Patient taking differently: Take 10 mEq by mouth See admin instructions. Take as directed--Take one tablet with each furosemide (Lasix) dose 03/02/17  Yes Pricilla Riffle, MD  simvastatin (ZOCOR) 20 MG tablet Take 1 tablet (20 mg total) by mouth at bedtime. 08/08/16  Yes Kriste Basque R, DO  triamcinolone cream (KENALOG) 0.1 % Apply 1 application topically 2 (two) times daily. 02/07/17  Yes Kriste Basque R, DO  vitamin C (ASCORBIC ACID) 500 MG tablet Take 500 mg by mouth daily.    Yes [provider]  warfarin (COUMADIN) 5 MG tablet Take as directed by Coumadin Clinic Patient taking differently: Take 2.5-5 mg by mouth See admin instructions. Take 1 tablet on Monday, Wednesday and Friday then take 1/2 tablet all the other days 01/26/17  Yes Pricilla Riffle, MD  lidocaine (LIDODERM) 5 % Place 1 patch onto the skin daily. Remove & Discard patch within 12 hours or as directed by MD 03/14/17    Belinda Fisher, PA-C  ONETOUCH DELICA LANCETS FINE MISC USE ONCE DAILY 05/05/16   [name], DO    Physical Exam: Vitals:   03/14/17 1918 03/15/17 0105  BP: (!) 178/85 (!) 182/92  Pulse: 72 72  Resp: 18 18  Temp: 98.3 F (36.8 C) 98.2 F (36.8 C)  TempSrc: Oral Oral  SpO2: 100% 100%      Constitutional: NAD, calm  Eyes: PERTLA, lids and conjunctivae normal ENMT: Mucous membranes are moist. Posterior pharynx clear of any exudate or lesions.   Neck: normal, supple, no masses, no thyromegaly Respiratory: clear to auscultation bilaterally, no wheezing, no crackles. Normal respiratory effort.    Cardiovascular: S1 & S2 heard, regular rate and rhythm. Trace pretibial edema bilaterally. No significant JVD. Abdomen: No distension, no tenderness, soft. Bowel sounds active. Maroon blood and small clots in colostomy bag.  Musculoskeletal: no clubbing / cyanosis. No joint deformity upper and lower extremities.    Skin: no significant  rashes, lesions, ulcers. Warm, dry, well-perfused. Neurologic: CN 2-12 grossly intact. Sensation intact. Strength 5/5 in all 4 limbs.  Psychiatric: Alert and oriented x 3. Pleasant and cooperative.     Labs on Admission: I have personally reviewed following labs and imaging studies  CBC: Recent Labs  Lab 03/09/17 0826 03/14/17 1926 03/15/17 0136  WBC 7.9 11.2*  --   NEUTROABS  --  7.1  --   HGB 13.7 12.9 12.5  HCT 42.3 40.8 39.9  MCV 88.4 89.3  --   PLT 284.0 301  --    Basic Metabolic Panel: Recent Labs  Lab 03/09/17 0826 03/14/17 1926  NA 141 140  K 3.8 3.9  CL 105 105  CO2 28 25  GLUCOSE 111* 127*  BUN 18 12  CREATININE 0.88 1.07*  CALCIUM 9.6 9.5   GFR: Estimated Creatinine Clearance: 48.8 mL/min (A) (by C-G formula based on SCr of 1.07 mg/dL (H)). Liver Function Tests: Recent Labs  Lab 03/14/17 1926  AST 23  ALT 11*  ALKPHOS 53  BILITOT 0.6  PROT 6.9  ALBUMIN 3.5   No results for input(s): LIPASE, AMYLASE in the last 168  hours. No results for input(s): AMMONIA in the last 168 hours. Coagulation Profile: Recent Labs  Lab 03/09/17 1419 03/14/17 1357 03/14/17 1926  INR 3.8 5.3 3.47   Cardiac Enzymes: No results for input(s): CKTOTAL, CKMB, CKMBINDEX, TROPONINI in the last 168 hours. BNP (last 3 results) No results for input(s): PROBNP in the last 8760 hours. HbA1C: No results for input(s): HGBA1C in the last 72 hours. CBG: No results for input(s): GLUCAP in the last 168 hours. Lipid Profile: No results for input(s): CHOL, HDL, LDLCALC, TRIG, CHOLHDL, LDLDIRECT in the last 72 hours. Thyroid Function Tests: No results for input(s): TSH, T4TOTAL, FREET4, T3FREE, THYROIDAB in the last 72 hours. Anemia Panel: No results for input(s): VITAMINB12, FOLATE, FERRITIN, TIBC, IRON, RETICCTPCT in the last 72 hours. Urine analysis:    Component Value Date/Time   COLORURINE YELLOW 12/16/2015 2114   APPEARANCEUR CLOUDY (A) 12/16/2015 2114   LABSPEC 1.015 03/14/2017 1102   PHURINE 5.0 03/14/2017 1102   GLUCOSEU NEGATIVE 03/14/2017 1102   HGBUR SMALL (A) 03/14/2017 1102   BILIRUBINUR NEGATIVE 03/14/2017 1102   KETONESUR NEGATIVE 03/14/2017 1102   PROTEINUR NEGATIVE 03/14/2017 1102   UROBILINOGEN 0.2 03/14/2017 1102   NITRITE NEGATIVE 03/14/2017 1102   LEUKOCYTESUR TRACE (A) 03/14/2017 1102   Sepsis Labs: @LABRCNTIP (procalcitonin:4,lacticidven:4) )No results found for this or any previous visit (from the past 240 hour(s)).   Radiological Exams on Admission: Dg Chest 2 View  Result Date: 03/14/2017 CLINICAL DATA:  Acute right rib pain. EXAM: CHEST - 2 VIEW COMPARISON:  Radiograph of October 12, 2014. FINDINGS: The heart size and mediastinal contours are within normal limits. Both lungs are clear. No pneumothorax or pleural effusion is noted. Atherosclerosis of thoracic aorta is noted. The visualized skeletal structures are unremarkable. IMPRESSION: No active cardiopulmonary disease. Aortic Atherosclerosis  (ICD10-I70.0). Electronically Signed   By: Lupita Raider, M.D.   On: 03/14/2017 11:45    EKG: Not performed.   Assessment/Plan  1. Acute GI bleeding  - Presents with bright red blood in colostomy bag  - Reports mild nausea, right flank pain, but no abdominal pain or vomiting and no fevers - Red blood noted in colostomy bag; abd exam is benign; she has never been tachycardic or hypotensive and denies lightheadedness  - Has hx of partial colectomy and diverticula were noted in  sigmoid and descending colon on colonoscopy one year ago   - Type and screen has been performed  - Plan to hold warfarin, follow H&H q8h, start Protonix BID though lower GIB seems most likely    2. History of DVT and PE  - No evidence for acute VTE  - Has IVC filter and managed with warfarin  - INR is 3.47 on admission  - She has remained hemodynamically stable, Hgb close to baseline, and so will hold warfarin and let INR trend down for now    3. Chronic diastolic CHF  - Appears well-compensated  - Hold Lasix and provide a gentle IVF hydration while NPO and bleeding   - Follow daily wt and I/O's  4. Type II DM  - A1c was 6.4% earlier this month  - Managed at home with metformin, held on admission  - Follow CBG and use Novolog correctional as needed    5. Hypothyroidism  - Resume Synthroid once appropriate for a diet    6. Hypertension  - BP elevated in ED  - Use hydralazine IVP prn for now while NPO     DVT prophylaxis: warfarin pta  Code Status: Full  Family Communication: Discussed with patient Consults called: None Admission status: Inpatient    Briscoe Deutscher, MD [facility] Pager [phone]  If 7PM-7AM, please contact night-coverage [email or web address] Password [facility]  03/15/2017, 2:58 AM

## 2017-03-15 NOTE — Progress Notes (Signed)
New Admission Note: Pt admitted to room 5M15 from [facility]  Arrival Method: via wheelchair Mental Orientation: alert and oriented x 4 Telemetry: Placed on box 15 Assessment: Completed Skin: Intact IV: R AC Pain: Denies Tubes: None Safety Measures: Safety Fall Prevention Plan has been discussed  Admission: Tom be completed 6 [place] Orientation: Patient has been orientated to the room, unit and staff.  Family: None at bedside  Orders to be reviewed and implemented. Will continue to monitor the patient. Call light has been placed within reach and bed alarm has been activated.   Burley Saver, BSN, RN-BC Phone: [phone]

## 2017-03-15 NOTE — ED Notes (Signed)
Pt ambulated to restroom with her cane. Steady gait. No distress noted.

## 2017-03-15 NOTE — Progress Notes (Addendum)
PROGRESS NOTE    Hannah [name]  ONG:[id number] DOB: [birth year] DOA: 03/14/2017 PCP: [name], DO   Brief Narrative:  Hannah [name] is Hannah [name] 77 y.o. female with medical history significant for history of DVT and PE on warfarin, type 2 diabetes mellitus, hypertension, and sigmoid stricture status post partial colectomy in 2016, now presenting to the emergency department for evaluation of blood in her colostomy bag.  Patient reports that she been in her usual state of health until developing pain in the right flank yesterday and then noting blood in her colostomy bag.  She reports mild nausea without vomiting, denies fevers or chills.  She denies chest pain, palpitations, or lightheadedness.  She has had similar pain in the right flank previously and reports that it is worse with certain movements.  She underwent colonoscopy 1 year ago and was found to have sigmoid and descending diverticula.  She was evaluated at urgent care for these complaints yesterday, noted to have INR of 5.3, and discharged home.  She has since been to her Coumadin clinic where she was instructed to hold her warfarin for 2 days.  Bleeding persisted and she presents to the ED.  Assessment & Plan:   Principal Problem:   GI bleed Active Problems:   Hypothyroid   Essential hypertension   Diabetes mellitus with neurological manifestations, controlled (HCC)   History of pulmonary embolism   Chronic diastolic CHF (congestive heart failure) (HCC)   Acute GI bleeding  1. Acute GI bleeding  - Presents with bright red blood in colostomy bag  - Reports mild nausea, right flank pain, but no abdominal pain or vomiting and no fevers - Red blood noted in colostomy bag; abd exam is benign; she has never been tachycardic or hypotensive and denies lightheadedness  - Has hx of partial colectomy and diverticula were noted in sigmoid and descending colon on colonoscopy one year ago   - Type and screen has been performed  - Plan to hold  warfarin, follow H&H q8h, start Protonix BID though lower GIB seems most likely   - GI c/s, appreciate recs  2. History of DVT and PE  - No evidence for acute VTE  - Has IVC filter and managed with warfarin  - INR is 3.47 on admission (3.58 this AM) - She has remained hemodynamically stable, Hgb close to baseline, and so will hold warfarin and let INR trend down for now   - L>R LEE, follow doppler  3. Chronic diastolic CHF  - Appears well-compensated  - Hold Lasix for now - Follow daily wt and I/O's  4. Type II DM  - A1c was 6.4% earlier this month  - Managed at home with metformin, held on admission  - Follow CBG and use Novolog correctional as needed    5. Hypothyroidism  - synthroid  6. Hypertension  - resume lisinopril and metoprolol.  Hold lasix for now  # R flank pain: seems musculoskeletal.  Lidocaine patch.  APAP prn.   DVT prophylaxis: supratherapeutic INR, will need to restart warfarin or SCD's if subtherapeutic Code Status: full  Family Communication: none at bedsdie Disposition Plan: pending GI eval and stability of h/h   Consultants:   GI c/s  Procedures:   LE [place]  Antimicrobials:  Anti-infectives (From admission, onward)   None      Subjective: Noticed blood in bag Monday.  Presented to UC for R flank pain, but hasn't gotten to use lidocaine patch yet.   Objective: Vitals:  03/15/17 1400 03/15/17 1500 03/15/17 1700 03/15/17 1804  BP: 111/61 (!) 181/78 (!) 153/92 (!) 166/79  Pulse: 94 92 (!) 107 86  Resp: 18 20  (!) 22  Temp:    97.9 F (36.6 C)  TempSrc:    Oral  SpO2: 100% 100% 98% 98%  Weight:    78 kg (171 lb 15.3 oz)  Height:    5\' 7"  (1.702 m)    Intake/Output Summary (Last 24 hours) at 03/15/2017 2002 Last data filed at 03/15/2017 1813 Gross per 24 hour  Intake 120 ml  Output 0 ml  Net 120 ml   Filed Weights   03/15/17 1804  Weight: 78 kg (171 lb 15.3 oz)    Examination:  General exam: Appears calm and comfortable   Respiratory system: Clear to auscultation. Respiratory effort normal. Cardiovascular system: S1 & S2 heard, RRR. No JVD, murmurs, rubs, gallops or clicks. No pedal edema. Gastrointestinal system: Abdomen is nondistended, soft and nontender. No organomegaly or masses felt. Normal bowel sounds heard. Ostomy with brown stool. Central nervous system: Alert and oriented. No focal neurological deficits. Extremities: Symmetric 5 x 5 power. MSK: mild TTP of R flank Skin: No rashes, lesions or ulcers Psychiatry: Judgement and insight appear normal. Mood & affect appropriate.     Data Reviewed: I have personally reviewed following labs and imaging studies  CBC: Recent Labs  Lab 03/09/17 0826 03/14/17 1926 03/15/17 0136 03/15/17 1837  WBC 7.9 11.2*  --   --   NEUTROABS  --  7.1  --   --   HGB 13.7 12.9 12.5 12.4  HCT 42.3 40.8 39.9 39.3  MCV 88.4 89.3  --   --   PLT 284.0 301  --   --    Basic Metabolic Panel: Recent Labs  Lab 03/09/17 0826 03/14/17 1926 03/15/17 0352  NA 141 140 141  K 3.8 3.9 4.0  CL 105 105 108  CO2 28 25 23   GLUCOSE 111* 127* 96  BUN 18 12 14   CREATININE 0.88 1.07* 0.91  CALCIUM 9.6 9.5 9.4   GFR: Estimated Creatinine Clearance: 57.5 mL/min (by C-G formula based on SCr of 0.91 mg/dL). Liver Function Tests: Recent Labs  Lab 03/14/17 1926  AST 23  ALT 11*  ALKPHOS 53  BILITOT 0.6  PROT 6.9  ALBUMIN 3.5   No results for input(s): LIPASE, AMYLASE in the last 168 hours. No results for input(s): AMMONIA in the last 168 hours. Coagulation Profile: Recent Labs  Lab 03/09/17 1419 03/14/17 1357 03/14/17 1926 03/15/17 0352  INR 3.8 5.3 3.47 3.58   Cardiac Enzymes: No results for input(s): CKTOTAL, CKMB, CKMBINDEX, TROPONINI in the last 168 hours. BNP (last 3 results) No results for input(s): PROBNP in the last 8760 hours. HbA1C: No results for input(s): HGBA1C in the last 72 hours. CBG: Recent Labs  Lab 03/15/17 0406 03/15/17 0818  03/15/17 1233 03/15/17 1649 03/15/17 1808  GLUCAP 89 84 83 98 100*   Lipid Profile: No results for input(s): CHOL, HDL, LDLCALC, TRIG, CHOLHDL, LDLDIRECT in the last 72 hours. Thyroid Function Tests: No results for input(s): TSH, T4TOTAL, FREET4, T3FREE, THYROIDAB in the last 72 hours. Anemia Panel: No results for input(s): VITAMINB12, FOLATE, FERRITIN, TIBC, IRON, RETICCTPCT in the last 72 hours. Sepsis Labs: No results for input(s): PROCALCITON, LATICACIDVEN in the last 168 hours.  Recent Results (from the past 240 hour(s))  Urine culture     Status: Abnormal   Collection Time: 03/14/17 12:55 PM  Result  Value Ref Range Status   Specimen Description URINE, RANDOM  Final   Special Requests NONE  Final   Culture (Nadea Kirkland)  Final    <10,000 COLONIES/mL INSIGNIFICANT GROWTH Performed at [facility], [address] [place], [place] [address]    Report Status 03/15/2017 FINAL  Final         Radiology Studies: Dg Chest 2 View  Result Date: 03/14/2017 CLINICAL DATA:  Acute right rib pain. EXAM: CHEST - 2 VIEW COMPARISON:  Radiograph of October 12, 2014. FINDINGS: The heart size and mediastinal contours are within normal limits. Both lungs are clear. No pneumothorax or pleural effusion is noted. Atherosclerosis of thoracic aorta is noted. The visualized skeletal structures are unremarkable. IMPRESSION: No active cardiopulmonary disease. Aortic Atherosclerosis (ICD10-I70.0). Electronically Signed   By: Lupita Raider, M.D.   On: 03/14/2017 11:45        Scheduled Meds: . insulin aspart  0-9 Units Subcutaneous Q4H  . pantoprazole (PROTONIX) IV  40 mg Intravenous Q12H  . sodium chloride flush  3 mL Intravenous Q12H   Continuous Infusions:   LOS: 0 days    Time spent: over 30 min    Lacretia Nicks, MD [facility] Pager [phone]  If 7PM-7AM, please contact night-coverage [email or web address] Password [facility] 03/15/2017, 8:02 PM

## 2017-03-15 NOTE — Consult Note (Signed)
[facility] Consultation Note  Referring Provider: Dr. Lowell Guitar Primary Care Physician:  [name], DO Primary Gastroenterologist:  Dr. Dorena Cookey  Reason for Consultation:  hematochezia  HPI: Hannah [name] is a 77 y.o. female on chronic anticoagulation with multiple medical problems.  Patient presents with 2-day history of intermittent blood per colostomy.  Had similar episode about one year ago, had colonoscopy per colostomy done which showed diverticulosis only.  She has no abdominal pain, nausea, vomiting.     Past Medical History:  Diagnosis Date  . Acute massive pulmonary embolism (HCC) 07/13/2012   Massive PE w/ PEA arrest 07/13/12 >TNK >IVC filter >discharged on comadin    . Diabetes mellitus, type 2 (HCC) 07/15/2012   with peripheral neuropathy  . Hyperlipemia 07/14/2012  . Hypertension 07/14/2012  . Large bowel stricture (HCC)    s/p colectomy in 2016  . Osteoarthritis    s/p hip and knee replacements  . Thyroid disease     Past Surgical History:  Procedure Laterality Date  . COLONOSCOPY N/A 03/12/2016   Procedure: COLONOSCOPY;  Surgeon: Dorena Cookey, MD;  Location: [facility] ENDOSCOPY;  Service: Endoscopy;  Laterality: N/A;  . COLOSTOMY N/A 06/03/2014   Procedure: COLOSTOMY;  Surgeon: Harriette Bouillon, MD;  Location: [facility] OR;  Service: General;  Laterality: N/A;  . FLEXIBLE SIGMOIDOSCOPY N/A 05/30/2014   Procedure: Arnell Sieving;  Surgeon: Jeani Hawking, MD;  Location: [facility] ENDOSCOPY;  Service: Endoscopy;  Laterality: N/A;  . INSERTION OF VENA CAVA FILTER N/A 07/16/2012   Procedure: INSERTION OF VENA CAVA FILTER;  Surgeon: [name], MD;  Location: [facility] CATH LAB;  Service: Cardiovascular;  Laterality: N/A;  . KNEE ARTHROSCOPY    . PARTIAL COLECTOMY N/A 06/03/2014   Procedure: PARTIAL COLECTOMY;  Surgeon: Harriette Bouillon, MD;  Location: [facility] OR;  Service: General;  Laterality: N/A;  . REDUCTION MAMMAPLASTY Bilateral   . SP ARTHRO HIP*L*      Prior to Admission  medications   Medication Sig Start Date End Date Taking? Authorizing Provider  acetaminophen (TYLENOL) 500 MG tablet Take 500 mg by mouth every 6 (six) hours as needed (pain).   Yes [provider]  furosemide (LASIX) 20 MG tablet Take 1 tablet (20 mg total) by mouth daily. 03/09/17  Yes [name], DO  levothyroxine (SYNTHROID, LEVOTHROID) 25 MCG tablet Take 1 tablet by mouth  daily before breakfast Patient taking differently: Take 25 mcg by mouth daily before breakfast.  10/28/16  Yes Kriste Basque R, DO  lisinopril (PRINIVIL,ZESTRIL) 40 MG tablet Take 1 tablet (40 mg total) by mouth daily. 10/20/16  Yes Kriste Basque R, DO  metFORMIN (GLUCOPHAGE) 1000 MG tablet TAKE ONE TABLET BY MOUTH WITH BREAKFAST AND TAKE ONE TABLET WITH DINNER 02/08/17  Yes Kriste Basque R, DO  metoprolol tartrate (LOPRESSOR) 25 MG tablet Take 1 tablet (25 mg total) by mouth 2 (two) times daily. 10/28/16  Yes Kriste Basque R, DO  potassium chloride (K-DUR) 10 MEQ tablet Take as directed--Take one tablet with each furosemide (Lasix) dose Patient taking differently: Take 10 mEq by mouth See admin instructions. Take as directed--Take one tablet with each furosemide (Lasix) dose 03/02/17  Yes Pricilla Riffle, MD  simvastatin (ZOCOR) 20 MG tablet Take 1 tablet (20 mg total) by mouth at bedtime. 08/08/16  Yes Kriste Basque R, DO  triamcinolone cream (KENALOG) 0.1 % Apply 1 application topically 2 (two) times daily. 02/07/17  Yes Kriste Basque R, DO  vitamin C (ASCORBIC ACID) 500 MG tablet Take  500 mg by mouth daily.    Yes [provider]  warfarin (COUMADIN) 5 MG tablet Take as directed by [facility] Patient taking differently: Take 2.5-5 mg by mouth See admin instructions. Take 1 tablet on Monday, Wednesday and Friday then take 1/2 tablet all the other days 01/26/17  Yes Pricilla Riffle, MD  lidocaine (LIDODERM) 5 % Place 1 patch onto the skin daily. Remove & Discard patch within 12 hours or as directed by MD 03/14/17   Belinda Fisher,  PA-C  ONETOUCH DELICA LANCETS FINE MISC USE ONCE DAILY 05/05/16   [name], DO    Current Facility-Administered Medications  Medication Dose Route Frequency Provider Last Rate Last Dose  . 0.9 %  sodium chloride infusion   Intravenous Continuous Opyd, Lavone Neri, MD 65 mL/hr at 03/15/17 0408    . acetaminophen (TYLENOL) tablet 650 mg  650 mg Oral Q6H PRN Opyd, Lavone Neri, MD   650 mg at 03/15/17 4098   Or  . acetaminophen (TYLENOL) suppository 650 mg  650 mg Rectal Q6H PRN Opyd, Lavone Neri, MD      . fentaNYL (SUBLIMAZE) injection 25-50 mcg  25-50 mcg Intravenous Q2H PRN Opyd, Lavone Neri, MD      . hydrALAZINE (APRESOLINE) injection 10 mg  10 mg Intravenous Q4H PRN Opyd, Lavone Neri, MD   10 mg at 03/15/17 0442  . insulin aspart (novoLOG) injection 0-9 Units  0-9 Units Subcutaneous Q4H Opyd, Lavone Neri, MD      . ondansetron (ZOFRAN) tablet 4 mg  4 mg Oral Q6H PRN Opyd, Lavone Neri, MD       Or  . ondansetron (ZOFRAN) injection 4 mg  4 mg Intravenous Q6H PRN Opyd, Lavone Neri, MD      . pantoprazole (PROTONIX) injection 40 mg  40 mg Intravenous Q12H Opyd, Lavone Neri, MD   40 mg at 03/15/17 1050  . sodium chloride flush (NS) 0.9 % injection 3 mL  3 mL Intravenous Q12H Opyd, Lavone Neri, MD   3 mL at 03/15/17 1051   Current Outpatient Medications  Medication Sig Dispense Refill  . acetaminophen (TYLENOL) 500 MG tablet Take 500 mg by mouth every 6 (six) hours as needed (pain).    . furosemide (LASIX) 20 MG tablet Take 1 tablet (20 mg total) by mouth daily. 30 tablet 3  . levothyroxine (SYNTHROID, LEVOTHROID) 25 MCG tablet Take 1 tablet by mouth  daily before breakfast (Patient taking differently: Take 25 mcg by mouth daily before breakfast. ) 90 tablet 1  . lisinopril (PRINIVIL,ZESTRIL) 40 MG tablet Take 1 tablet (40 mg total) by mouth daily. 90 tablet 1  . metFORMIN (GLUCOPHAGE) 1000 MG tablet TAKE ONE TABLET BY MOUTH WITH BREAKFAST AND TAKE ONE TABLET WITH DINNER 180 tablet 1  . metoprolol tartrate  (LOPRESSOR) 25 MG tablet Take 1 tablet (25 mg total) by mouth 2 (two) times daily. 180 tablet 1  . potassium chloride (K-DUR) 10 MEQ tablet Take as directed--Take one tablet with each furosemide (Lasix) dose (Patient taking differently: Take 10 mEq by mouth See admin instructions. Take as directed--Take one tablet with each furosemide (Lasix) dose) 30 tablet 6  . simvastatin (ZOCOR) 20 MG tablet Take 1 tablet (20 mg total) by mouth at bedtime. 90 tablet 3  . triamcinolone cream (KENALOG) 0.1 % Apply 1 application topically 2 (two) times daily. 30 g 0  . vitamin C (ASCORBIC ACID) 500 MG tablet Take 500 mg by mouth daily.     Marland Kitchen  warfarin (COUMADIN) 5 MG tablet Take as directed by [facility] (Patient taking differently: Take 2.5-5 mg by mouth See admin instructions. Take 1 tablet on Monday, Wednesday and Friday then take 1/2 tablet all the other days) 90 tablet 1  . lidocaine (LIDODERM) 5 % Place 1 patch onto the skin daily. Remove & Discard patch within 12 hours or as directed by MD 30 patch 0  . ONETOUCH DELICA LANCETS FINE MISC USE ONCE DAILY 100 each 3    Allergies as of 03/14/2017 - Review Complete 03/14/2017  Allergen Reaction Noted  . Augmentin [amoxicillin-pot clavulanate] Other (See Comments) 08/05/2012  . Penicillins    . Tranxene [clorazepate] Itching 09/06/2013    Family History  Problem Relation Age of Onset  . Breast cancer Mother     Social History   Socioeconomic History  . Marital status: Widowed    Spouse name: Not on file  . Number of children: Not on file  . Years of education: Not on file  . Highest education level: Not on file  Social Needs  . Financial resource strain: Not on file  . Food insecurity - worry: Not on file  . Food insecurity - inability: Not on file  . Transportation needs - medical: Not on file  . Transportation needs - non-medical: Not on file  Occupational History  . Not on file  Tobacco Use  . Smoking status: Former Smoker     Packs/day: 1.00    Years: 10.00    Pack years: 10.00    Types: Cigarettes    Last attempt to quit: 01/04/1968    Years since quitting: 49.2  . Smokeless tobacco: Never Used  Substance and Sexual Activity  . Alcohol use: No  . Drug use: No  . Sexual activity: Not on file  Other Topics Concern  . Not on file  Social History Narrative   Lives alone.           Review of Systems: As per HPI, all others negative  Physical Exam: Vital signs in last 24 hours: Temp:  [98.2 F (36.8 C)-98.3 F (36.8 C)] 98.2 F (36.8 C) (03/13 0105) Pulse Rate:  [69-95] 90 (03/13 1100) Resp:  [16-21] 21 (03/13 1100) BP: (133-182)/(69-96) 133/78 (03/13 1100) SpO2:  [98 %-100 %] 100 % (03/13 1100)   General:   Alert, overweight, younger-appearing than stated age, well-developed, well-nourished, pleasant and cooperative in NAD Head:  Normocephalic and atraumatic. Eyes:  Sclera clear, no icterus.   Conjunctiva pink. Ears:  Normal auditory acuity. Nose:  No deformity, discharge,  or lesions. Mouth:  No deformity or lesions.  Oropharynx pink & moist. Neck:  Supple; no masses or thyromegaly. Lungs:  Clear throughout to auscultation.   No wheezes, crackles, or rhonchi. No acute distress. Heart:  Regular rate and rhythm; no murmurs, clicks, rubs,  or gallops. Abdomen:  Soft, nontender and nondistended. LUQ ostomy with dark green stool; old surgical scars, No masses, hepatosplenomegaly or hernias noted. Normal bowel sounds, without guarding, and without rebound.   Msk:  Symmetrical without gross deformities. Normal posture. Pulses:  Normal pulses noted. Extremities:  Without clubbing or edema. Neurologic:  Alert and  oriented x4;  grossly normal neurologically. Skin:  Intact without significant lesions or rashes.. Psych:  Alert and cooperative. Normal mood and affect.   Lab Results: Recent Labs    03/14/17 1926 03/15/17 0136  WBC 11.2*  --   HGB 12.9 12.5  HCT 40.8 39.9  PLT 301  --  BMET Recent Labs    03/14/17 1926 03/15/17 0352  NA 140 141  K 3.9 4.0  CL 105 108  CO2 25 23  GLUCOSE 127* 96  BUN 12 14  CREATININE 1.07* 0.91  CALCIUM 9.5 9.4   LFT Recent Labs    03/14/17 1926  PROT 6.9  ALBUMIN 3.5  AST 23  ALT 11*  ALKPHOS 53  BILITOT 0.6   PT/INR Recent Labs    03/14/17 1926 03/15/17 0352  LABPROT 34.6* 35.5*  INR 3.47 3.58    Studies/Results: Dg Chest 2 View  Result Date: 03/14/2017 CLINICAL DATA:  Acute right rib pain. EXAM: CHEST - 2 VIEW COMPARISON:  Radiograph of October 12, 2014. FINDINGS: The heart size and mediastinal contours are within normal limits. Both lungs are clear. No pneumothorax or pleural effusion is noted. Atherosclerosis of thoracic aorta is noted. The visualized skeletal structures are unremarkable. IMPRESSION: No active cardiopulmonary disease. Aortic Atherosclerosis (ICD10-I70.0). Electronically Signed   By: Lupita Raider, M.D.   On: 03/14/2017 11:45    Impression:  1.  Hematochezia, seemingly resolved (or at least significantly improved). 2.  Chronic anticoagulation, warfarin. 3.  Recurrent DVT/PE. 4.  Multiple medical problems.  Plan:  1.  No need to reverse coagulopathy at this point; could just hold warfarin for the time-being. 2.  Clear liquid diet. 3.  If no further blood per ostomy, wouldn't do any further intervention; if has recurrent blood per ostomy, might have to consider further testing (tagged RBC study) +/- coagulopathy reversal. 4.  Follow CBC. 5.  [facility] will follow.  If no further bleeding in 24 hours, might consider discharge home tomorrow from GI perspective.   LOS: 0 days   Kaliopi Blyden M  03/15/2017, 12:05 PM  Cell [phone] If no answer or after 5 PM call [phone]

## 2017-03-15 NOTE — ED Provider Notes (Signed)
[facility] EMERGENCY DEPARTMENT Provider Note   CSN: [id number] Arrival date & time: 03/14/17  1848     History   Chief Complaint Chief Complaint  Patient presents with  . Bloody Stools    Colostomy    HPI Hannah [name] is a 77 y.o. female.  The history is provided by the patient.  She has a history of diabetes, hypertension, hyperlipidemia, pulmonary embolism anticoagulated on warfarin, GI bleed, diverticulosis, colostomy and comes in with bright red blood in her colostomy since last evening.  She is unable to quantitate how much blood has been in her colostomy, but she has had to empty it several times.  She initially had severe pain in the right lateral lower rib cage, but this has resolved.  There has been no nausea or vomiting.  Of note, she had blood drawn for INR earlier today and was told to hold her warfarin tonight because of an elevated INR.  She denies dizziness or lightheadedness.  Past Medical History:  Diagnosis Date  . Acute massive pulmonary embolism (HCC) 07/13/2012   Massive PE w/ PEA arrest 07/13/12 >TNK >IVC filter >discharged on comadin    . Diabetes mellitus, type 2 (HCC) 07/15/2012   with peripheral neuropathy  . Hyperlipemia 07/14/2012  . Hypertension 07/14/2012  . Large bowel stricture (HCC)    s/p colectomy in 2016  . Osteoarthritis    s/p hip and knee replacements  . Thyroid disease     Patient Active Problem List   Diagnosis Date Noted  . Pulmonary hypertension (HCC) 07/26/2016  . GI bleed 03/10/2016  . History of DVT in adulthood 03/10/2016  . Acute blood loss anemia 03/10/2016  . Gastrointestinal hemorrhage   . Diabetes mellitus with complication (HCC)   . Encounter for therapeutic drug monitoring 05/04/2015  . Neuropathy 02/12/2015  . Osteoarthritis 08/12/2014  . Normocytic anemia 06/08/2014  . Hypothyroid 06/06/2013  . Diabetes mellitus with neurological manifestations, controlled (HCC) 06/06/2013  . Long term current  use of anticoagulant therapy 07/27/2012    Past Surgical History:  Procedure Laterality Date  . COLONOSCOPY N/A 03/12/2016   Procedure: COLONOSCOPY;  Surgeon: Dorena Cookey, MD;  Location: [facility] ENDOSCOPY;  Service: Endoscopy;  Laterality: N/A;  . COLOSTOMY N/A 06/03/2014   Procedure: COLOSTOMY;  Surgeon: Harriette Bouillon, MD;  Location: [facility] OR;  Service: General;  Laterality: N/A;  . FLEXIBLE SIGMOIDOSCOPY N/A 05/30/2014   Procedure: Arnell Sieving;  Surgeon: Jeani Hawking, MD;  Location: [facility] ENDOSCOPY;  Service: Endoscopy;  Laterality: N/A;  . INSERTION OF VENA CAVA FILTER N/A 07/16/2012   Procedure: INSERTION OF VENA CAVA FILTER;  Surgeon: [name], MD;  Location: [facility] CATH LAB;  Service: Cardiovascular;  Laterality: N/A;  . KNEE ARTHROSCOPY    . PARTIAL COLECTOMY N/A 06/03/2014   Procedure: PARTIAL COLECTOMY;  Surgeon: Harriette Bouillon, MD;  Location: [facility] OR;  Service: General;  Laterality: N/A;  . REDUCTION MAMMAPLASTY Bilateral   . SP ARTHRO HIP*L*      OB History    No data available       Home Medications    Prior to Admission medications   Medication Sig Start Date End Date Taking? Authorizing Provider  acetaminophen (TYLENOL) 500 MG tablet Take 500 mg by mouth every 6 (six) hours as needed (pain).    [provider]  furosemide (LASIX) 20 MG tablet Take 1 tablet (20 mg total) by mouth daily. 03/09/17   [name], DO  levothyroxine (SYNTHROID, LEVOTHROID) 25 MCG  tablet Take 1 tablet by mouth  daily before breakfast 10/28/16   Kriste Basque R, DO  lidocaine (LIDODERM) 5 % Place 1 patch onto the skin daily. Remove & Discard patch within 12 hours or as directed by MD 03/14/17   Cathie Hoops, Amy V, PA-C  lisinopril (PRINIVIL,ZESTRIL) 40 MG tablet Take 1 tablet (40 mg total) by mouth daily. 10/20/16   [name], DO  metFORMIN (GLUCOPHAGE) 1000 MG tablet TAKE ONE TABLET BY MOUTH WITH BREAKFAST AND TAKE ONE TABLET WITH DINNER 02/08/17   [name], DO  metoprolol tartrate  (LOPRESSOR) 25 MG tablet Take 1 tablet (25 mg total) by mouth 2 (two) times daily. 10/28/16   [name], DO  ONETOUCH DELICA LANCETS FINE MISC USE ONCE DAILY 05/05/16   [name], DO  potassium chloride (K-DUR) 10 MEQ tablet Take as directed--Take one tablet with each furosemide (Lasix) dose 03/02/17   Pricilla Riffle, MD  simvastatin (ZOCOR) 20 MG tablet Take 1 tablet (20 mg total) by mouth at bedtime. 08/08/16   [name], DO  triamcinolone cream (KENALOG) 0.1 % Apply 1 application topically 2 (two) times daily. 02/07/17   [name], DO  vitamin C (ASCORBIC ACID) 500 MG tablet Take 500 mg by mouth daily.     [provider]  warfarin (COUMADIN) 5 MG tablet Take as directed by [facility] 01/26/17   Pricilla Riffle, MD    Family History Family History  Problem Relation Age of Onset  . Breast cancer Mother     Social History Social History   Tobacco Use  . Smoking status: Former Smoker    Packs/day: 1.00    Years: 10.00    Pack years: 10.00    Types: Cigarettes    Last attempt to quit: 01/04/1968    Years since quitting: 49.2  . Smokeless tobacco: Never Used  Substance Use Topics  . Alcohol use: No  . Drug use: No     Allergies   Augmentin [amoxicillin-pot clavulanate]; Penicillins; and Tranxene [clorazepate]   Review of Systems Review of Systems  All other systems reviewed and are negative.    Physical Exam Updated Vital Signs BP (!) 182/92 (BP Location: Left Arm)   Pulse 72   Temp 98.2 F (36.8 C) (Oral)   Resp 18   SpO2 100%   Physical Exam  Nursing note and vitals reviewed.  77 year old female, resting comfortably and in no acute distress. Vital signs are significant for elevated systolic blood pressure. Oxygen saturation is 100%, which is normal. Head is normocephalic and atraumatic. PERRLA, EOMI. Oropharynx is clear. Neck is nontender and supple without adenopathy or JVD. Back is nontender and there is no CVA tenderness. Lungs are clear  without rales, wheezes, or rhonchi. Chest is nontender. Heart has regular rate and rhythm without murmur. Abdomen is soft, flat, nontender without masses or hepatosplenomegaly and peristalsis is normoactive.  Colostomy present in left midabdomen with bright red blood within it. Extremities have 2+ pedal edema, full range of motion is present. Skin is warm and dry without rash. Neurologic: Mental status is normal, cranial nerves are intact, there are no motor or sensory deficits.  ED Treatments / Results  Labs (all labs ordered are listed, but only abnormal results are displayed) Labs Reviewed  CBC WITH DIFFERENTIAL/PLATELET - Abnormal; Notable for the following components:      Result Value   WBC 11.2 (*)    RDW 15.7 (*)  All other components within normal limits  COMPREHENSIVE METABOLIC PANEL - Abnormal; Notable for the following components:   Glucose, Bld 127 (*)    Creatinine, Ser 1.07 (*)    ALT 11 (*)    GFR calc non Af Amer 49 (*)    GFR calc Af Amer 57 (*)    All other components within normal limits  PROTIME-INR - Abnormal; Notable for the following components:   Prothrombin Time 34.6 (*)    All other components within normal limits  HEMOGLOBIN AND HEMATOCRIT, BLOOD  SAMPLE TO BLOOD BANK    EKG  EKG Interpretation None       Radiology Dg Chest 2 View  Result Date: 03/14/2017 CLINICAL DATA:  Acute right rib pain. EXAM: CHEST - 2 VIEW COMPARISON:  Radiograph of October 12, 2014. FINDINGS: The heart size and mediastinal contours are within normal limits. Both lungs are clear. No pneumothorax or pleural effusion is noted. Atherosclerosis of thoracic aorta is noted. The visualized skeletal structures are unremarkable. IMPRESSION: No active cardiopulmonary disease. Aortic Atherosclerosis (ICD10-I70.0). Electronically Signed   By: Lupita Raider, M.D.   On: 03/14/2017 11:45    Procedures Procedures (including critical care time)  Medications Ordered in  ED Medications - No data to display   Initial Impression / Assessment and Plan / ED Course  I have reviewed the triage vital signs and the nursing notes.  Pertinent labs & imaging results that were available during my care of the patient were reviewed by me and considered in my medical decision making (see chart for details).  Lower gastrointestinal bleeding.  Old records are reviewed, and she had been admitted to the hospital 1 year ago for lower gastrointestinal bleeding which apparently was due to diverticulosis.  INR earlier today was 5.3, but was actually down to 3.47 when she arrived in the ED.  This is within the therapeutic range for her (2.5-3.5).  She is hemodynamically stable, but will need to be watched closely.  Hemoglobin has dropped 0.8 g compared with 5 days ago.  We will check orthostatic vital signs and recheck hemoglobin to see if it is dropping further.  For now, will hold off reversing her anticoagulated state.  Case is discussed with Dr. Antionette Char of [facility], who agrees to admit the patient.  Repeat hemoglobin shows slight further drop.  This will need to be watched closely.  Final Clinical Impressions(s) / ED Diagnoses   Final diagnoses:  Lower gastrointestinal bleeding  Anticoagulated on warfarin    ED Discharge Orders    None       Dione Booze, MD 03/15/17 [phone]

## 2017-03-15 NOTE — Progress Notes (Signed)
Preliminary results by tech - Venous Duplex Lower Ext. Completed. Negative for acute deep vein thrombosis in both legs. There is evidence of mild residual thrombus in the right femoral and popliteal veins. Marilynne Halsted, BS, RDMS, RVT

## 2017-03-15 NOTE — ED Notes (Signed)
Attempted two IV's at this time unsuccessful. 

## 2017-03-16 ENCOUNTER — Ambulatory Visit: Payer: Medicare Other | Admitting: Family Medicine

## 2017-03-16 LAB — COMPREHENSIVE METABOLIC PANEL
ALK PHOS: 40 U/L (ref 38–126)
ALT: 10 U/L — AB (ref 14–54)
AST: 16 U/L (ref 15–41)
Albumin: 2.8 g/dL — ABNORMAL LOW (ref 3.5–5.0)
Anion gap: 7 (ref 5–15)
BILIRUBIN TOTAL: 0.8 mg/dL (ref 0.3–1.2)
BUN: 13 mg/dL (ref 6–20)
CALCIUM: 8.7 mg/dL — AB (ref 8.9–10.3)
CO2: 25 mmol/L (ref 22–32)
CREATININE: 0.9 mg/dL (ref 0.44–1.00)
Chloride: 109 mmol/L (ref 101–111)
Glucose, Bld: 100 mg/dL — ABNORMAL HIGH (ref 65–99)
Potassium: 4 mmol/L (ref 3.5–5.1)
Sodium: 141 mmol/L (ref 135–145)
TOTAL PROTEIN: 5.5 g/dL — AB (ref 6.5–8.1)

## 2017-03-16 LAB — GLUCOSE, CAPILLARY
GLUCOSE-CAPILLARY: 107 mg/dL — AB (ref 65–99)
GLUCOSE-CAPILLARY: 128 mg/dL — AB (ref 65–99)
GLUCOSE-CAPILLARY: 92 mg/dL (ref 65–99)
GLUCOSE-CAPILLARY: 93 mg/dL (ref 65–99)
Glucose-Capillary: 102 mg/dL — ABNORMAL HIGH (ref 65–99)
Glucose-Capillary: 106 mg/dL — ABNORMAL HIGH (ref 65–99)

## 2017-03-16 LAB — HEMOGLOBIN AND HEMATOCRIT, BLOOD
HEMATOCRIT: 38.9 % (ref 36.0–46.0)
HEMATOCRIT: 36.5 % (ref 36.0–46.0)
HEMOGLOBIN: 11.7 g/dL — AB (ref 12.0–15.0)
Hemoglobin: 12.1 g/dL (ref 12.0–15.0)

## 2017-03-16 LAB — PROTIME-INR
INR: 3.22
Prothrombin Time: 32.6 seconds — ABNORMAL HIGH (ref 11.4–15.2)

## 2017-03-16 LAB — HEMOGLOBIN: HEMOGLOBIN: 11 g/dL — AB (ref 12.0–15.0)

## 2017-03-16 LAB — HEMATOCRIT: HEMATOCRIT: 35.1 % — AB (ref 36.0–46.0)

## 2017-03-16 MED ORDER — DICLOFENAC SODIUM 1 % TD GEL
2.0000 g | Freq: Four times a day (QID) | TRANSDERMAL | Status: DC
  Administered 2017-03-16 – 2017-03-19 (×12): 2 g via TOPICAL
  Filled 2017-03-16: qty 100

## 2017-03-16 NOTE — Progress Notes (Signed)
PROGRESS NOTE    Hannah [name]  ZOX:[id number] DOB: 1940-10-19 DOA: 03/14/2017 PCP: Hannah Koyanagi, DO   Brief Narrative:  Hannah [name] is Hannah [name] 77 y.o. female with medical history significant for history of DVT and PE on warfarin, type 2 diabetes mellitus, hypertension, and sigmoid stricture status post partial colectomy in 2016, now presenting to the emergency department for evaluation of blood in her colostomy bag.  Patient reports that she been in her usual state of health until developing pain in the right flank yesterday and then noting blood in her colostomy bag.  She reports mild nausea without vomiting, denies fevers or chills.  She denies chest pain, palpitations, or lightheadedness.  She has had similar pain in the right flank previously and reports that it is worse with certain movements.  She underwent colonoscopy 1 year ago and was found to have sigmoid and descending diverticula.  She was evaluated at urgent care for these complaints yesterday, noted to have INR of 5.3, and discharged home.  She has since been to her Coumadin clinic where she was instructed to hold her warfarin for 2 days.  Bleeding persisted and she presents to the ED.  Assessment & Plan:   Principal Problem:   GI bleed Active Problems:   Hypothyroid   Essential hypertension   Diabetes mellitus with neurological manifestations, controlled (HCC)   History of pulmonary embolism   Chronic diastolic CHF (congestive heart failure) (HCC)   Acute GI bleeding  1. Acute GI bleeding  - She's had continued blood in ostomy, but H/H relatively stable (though slightly downtrending)  - Has hx of partial colectomy and diverticula were noted in sigmoid and descending colon on colonoscopy one year ago   - Type and screen has been performed  - Plan to hold warfarin, follow H&H q8h, start Protonix BID though lower GIB seems most likely   - GI c/s, appreciate recs (consider tagged RBC if continued bleeding)  2. History of DVT and  PE  - No evidence for acute VTE  - Has IVC filter and managed with warfarin  - INR is 3.47 on admission (3.22 this AM) - She has remained hemodynamically stable, Hgb close to baseline, and so will hold warfarin and let INR trend down for now   - L>R LEE (notable for chronic thrombus - see report below)  3. Chronic diastolic CHF  - Appears well-compensated  - Hold Lasix for now - Follow daily wt and I/O's  4. Type II DM  - A1c was 6.4% earlier this month  - Managed at home with metformin, held on admission  - Follow CBG and use Novolog correctional as needed    5. Hypothyroidism  - synthroid  6. Hypertension  - resume lisinopril and metoprolol.  Hold lasix for now  # R flank pain: persistent.  seems musculoskeletal.  Lidocaine patch.  APAP prn.   DVT prophylaxis: supratherapeutic INR, will need to restart warfarin or SCD's if subtherapeutic Code Status: full  Family Communication: none at bedsdie Disposition Plan: pending GI eval and stability of h/h   Consultants:   GI c/s  Procedures:   LE [place]  Antimicrobials:  Anti-infectives (From admission, onward)   None      Subjective: Has noted continued blood in bag. Continued flank pain.    Objective: Vitals:   03/15/17 1804 03/15/17 2030 03/16/17 0525 03/16/17 1025  BP: (!) 166/79 (!) 192/83 (!) 174/75 (!) 163/76  Pulse: 86 86 73 80  Resp: (!) 22 20  19 18  Temp: 97.9 F (36.6 C) 97.8 F (36.6 C) 98 F (36.7 C) 98.5 F (36.9 C)  TempSrc: Oral Oral Oral Oral  SpO2: 98% 100% 100% 100%  Weight: 78 kg (171 lb 15.3 oz) 80.7 kg (177 lb 14.4 oz)    Height: 5\' 7"  (1.702 m)       Intake/Output Summary (Last 24 hours) at 03/16/2017 1036 Last data filed at 03/16/2017 1025 Gross per 24 hour  Intake 840 ml  Output 250 ml  Net 590 ml   Filed Weights   03/15/17 1804 03/15/17 2030  Weight: 78 kg (171 lb 15.3 oz) 80.7 kg (177 lb 14.4 oz)    Examination:  General: No acute distress. Cardiovascular: Heart  sounds show Hannah [name] regular rate, and rhythm. No gallops or rubs. No murmurs. No JVD. Lungs: Clear to auscultation bilaterally with good air movement. No rales, rhonchi or wheezes. Abdomen: Soft, nontender, nondistended with normal active bowel sounds. No masses. No hepatosplenomegaly.  Blood noted in colostomy bag.  R sided flank pain to inferior costal border Neurological: Alert and oriented 3. Moves all extremities 4. Cranial nerves II through XII grossly intact. Skin: Warm and dry. No rashes or lesions. Extremities: No clubbing or cyanosis. No edema.  Psychiatric: Mood and affect are normal. Insight and judgment are appropriate.     Data Reviewed: I have personally reviewed following labs and imaging studies  CBC: Recent Labs  Lab 03/14/17 1926 03/15/17 0136 03/15/17 1837 03/16/17 0203  WBC 11.2*  --   --   --   NEUTROABS 7.1  --   --   --   HGB 12.9 12.5 12.4 11.0*  HCT 40.8 39.9 39.3 35.1*  MCV 89.3  --   --   --   PLT 301  --   --   --    Basic Metabolic Panel: Recent Labs  Lab 03/14/17 1926 03/15/17 0352 03/16/17 0203  NA 140 141 141  K 3.9 4.0 4.0  CL 105 108 109  CO2 25 23 25   GLUCOSE 127* 96 100*  BUN 12 14 13   CREATININE 1.07* 0.91 0.90  CALCIUM 9.5 9.4 8.7*   GFR: Estimated Creatinine Clearance: 59 mL/min (by C-G formula based on SCr of 0.9 mg/dL). Liver Function Tests: Recent Labs  Lab 03/14/17 1926 03/16/17 0203  AST 23 16  ALT 11* 10*  ALKPHOS 53 40  BILITOT 0.6 0.8  PROT 6.9 5.5*  ALBUMIN 3.5 2.8*   No results for input(s): LIPASE, AMYLASE in the last 168 hours. No results for input(s): AMMONIA in the last 168 hours. Coagulation Profile: Recent Labs  Lab 03/09/17 1419 03/14/17 1357 03/14/17 1926 03/15/17 0352 03/16/17 0203  INR 3.8 5.3 3.47 3.58 3.22   Cardiac Enzymes: No results for input(s): CKTOTAL, CKMB, CKMBINDEX, TROPONINI in the last 168 hours. BNP (last 3 results) No results for input(s): PROBNP in the last 8760  hours. HbA1C: No results for input(s): HGBA1C in the last 72 hours. CBG: Recent Labs  Lab 03/15/17 1808 03/15/17 2036 03/16/17 0034 03/16/17 0359 03/16/17 0756  GLUCAP 100* 133* 92 102* 106*   Lipid Profile: No results for input(s): CHOL, HDL, LDLCALC, TRIG, CHOLHDL, LDLDIRECT in the last 72 hours. Thyroid Function Tests: No results for input(s): TSH, T4TOTAL, FREET4, T3FREE, THYROIDAB in the last 72 hours. Anemia Panel: No results for input(s): VITAMINB12, FOLATE, FERRITIN, TIBC, IRON, RETICCTPCT in the last 72 hours. Sepsis Labs: No results for input(s): PROCALCITON, LATICACIDVEN in the last 168 hours.  Recent Results (from the past 240 hour(s))  Urine culture     Status: Abnormal   Collection Time: 03/14/17 12:55 PM  Result Value Ref Range Status   Specimen Description URINE, RANDOM  Final   Special Requests NONE  Final   Culture (Kelechi Astarita)  Final    <10,000 COLONIES/mL INSIGNIFICANT GROWTH Performed at [facility], [address] [place], [place] [address]    Report Status 03/15/2017 FINAL  Final         Radiology Studies: Dg Chest 2 View  Result Date: 03/14/2017 CLINICAL DATA:  Acute right rib pain. EXAM: CHEST - 2 VIEW COMPARISON:  Radiograph of October 12, 2014. FINDINGS: The heart size and mediastinal contours are within normal limits. Both lungs are clear. No pneumothorax or pleural effusion is noted. Atherosclerosis of thoracic aorta is noted. The visualized skeletal structures are unremarkable. IMPRESSION: No active cardiopulmonary disease. Aortic Atherosclerosis (ICD10-I70.0). Electronically Signed   By: Lupita Raider, M.D.   On: 03/14/2017 11:45        Scheduled Meds: . insulin aspart  0-9 Units Subcutaneous Q4H  . levothyroxine  25 mcg Oral QAC breakfast  . lidocaine  1 patch Transdermal Q24H  . lisinopril  40 mg Oral Daily  . metoprolol tartrate  25 mg Oral BID  . pantoprazole (PROTONIX) IV  40 mg Intravenous Q12H  . sodium chloride flush  3 mL  Intravenous Q12H   Continuous Infusions:   LOS: 1 day    Time spent: over 30 min    Lacretia Nicks, MD [facility] Pager [phone]  If 7PM-7AM, please contact night-coverage [email or web address] Password [facility] 03/16/2017, 10:36 AM

## 2017-03-16 NOTE — Progress Notes (Signed)
Subjective: Some blood in ostomy bag. No abdominal pain.  Objective: Vital signs in last 24 hours: Temp:  [97.8 F (36.6 C)-98.5 F (36.9 C)] 98.5 F (36.9 C) (03/14 1025) Pulse Rate:  [73-107] 80 (03/14 1025) Resp:  [18-22] 18 (03/14 1025) BP: (111-192)/(61-92) 163/76 (03/14 1025) SpO2:  [98 %-100 %] 100 % (03/14 1025) Weight:  [171 lb 15.3 oz (78 kg)-177 lb 14.4 oz (80.7 kg)] 177 lb 14.4 oz (80.7 kg) (03/13 2030) Weight change:  Last BM Date: 03/16/17  PE: GEN:  NAD ABD:  Some scant maroon effluent in ostomy bag, non-tender  Lab Results: CBC    Component Value Date/Time   WBC 11.2 (H) 03/14/2017 1926   RBC 4.57 03/14/2017 1926   HGB 12.1 03/16/2017 1137   HCT 38.9 03/16/2017 1137   PLT 301 03/14/2017 1926   MCV 89.3 03/14/2017 1926   MCH 28.2 03/14/2017 1926   MCHC 31.6 03/14/2017 1926   RDW 15.7 (H) 03/14/2017 1926   LYMPHSABS 3.5 03/14/2017 1926   MONOABS 0.5 03/14/2017 1926   EOSABS 0.1 03/14/2017 1926   BASOSABS 0.0 03/14/2017 1926   CMP     Component Value Date/Time   NA 141 03/16/2017 0203   K 4.0 03/16/2017 0203   CL 109 03/16/2017 0203   CO2 25 03/16/2017 0203   GLUCOSE 100 (H) 03/16/2017 0203   BUN 13 03/16/2017 0203   CREATININE 0.90 03/16/2017 0203   CALCIUM 8.7 (L) 03/16/2017 0203   PROT 5.5 (L) 03/16/2017 0203   ALBUMIN 2.8 (L) 03/16/2017 0203   AST 16 03/16/2017 0203   ALT 10 (L) 03/16/2017 0203   ALKPHOS 40 03/16/2017 0203   BILITOT 0.8 03/16/2017 0203   GFRNONAA >60 03/16/2017 0203   GFRAA >60 03/16/2017 0203   Assessment:  1.  Hematochezia, seemingly resolved (or at least significantly improved).  No appreciable change in Hgb. 2.  Chronic anticoagulation, warfarin. 3.  Recurrent DVT/PE. 4.  Multiple medical problems.  Plan:  1.  Clear liquids for now. 2.  If further bleeding would pursue tagged RBC as next step in management. 3.  [facility] will follow.   Freddy Jaksch 03/16/2017, 1:22 PM   Cell [phone] If no  answer or after 5 PM call [phone]

## 2017-03-16 NOTE — Care Management Note (Addendum)
Case Management Note  Patient Details  Name: Hannah [name] MRN: [id number] Date of Birth: 25-May-1940  Subjective/Objective:   History of history of CHF, DVT and PE on warfarin,sigmoid stricture status post partial colectomy; Admitted for Acute GI Bleed                Action/Plan: Type and screen; hold warfarin, follow H&H q8h, start Protonix; GI consulted.   PCP noted. Prior to admission patient lived at home.  NCM will continue to monitor for discharge transition needs.  Expected Discharge Date:   To be determined               Expected Discharge Plan:  Home/Self Care  Discharge planning Services  CM Consult  Status of Service:  In process, will continue to follow  Yancey Flemings, RN  Nurse case manager [place]  [phone] 03/16/2017, 12:27 PM

## 2017-03-17 ENCOUNTER — Inpatient Hospital Stay (HOSPITAL_COMMUNITY): Payer: Medicare Other

## 2017-03-17 LAB — BASIC METABOLIC PANEL
Anion gap: 8 (ref 5–15)
BUN: 10 mg/dL (ref 6–20)
CO2: 25 mmol/L (ref 22–32)
CREATININE: 0.97 mg/dL (ref 0.44–1.00)
Calcium: 8.9 mg/dL (ref 8.9–10.3)
Chloride: 108 mmol/L (ref 101–111)
GFR calc Af Amer: >60 mL/min (ref >60)
GFR, EST NON AFRICAN AMERICAN: 56 mL/min — AB (ref >60)
Glucose, Bld: 109 mg/dL — ABNORMAL HIGH (ref 65–99)
Potassium: 3.9 mmol/L (ref 3.5–5.1)
SODIUM: 141 mmol/L (ref 135–145)

## 2017-03-17 LAB — HEMOGLOBIN AND HEMATOCRIT, BLOOD
HCT: 40.8 % (ref 36.0–46.0)
HCT: 38.3 % (ref 36.0–46.0)
HEMATOCRIT: 35.1 % — AB (ref 36.0–46.0)
HEMOGLOBIN: 10.8 g/dL — AB (ref 12.0–15.0)
HEMOGLOBIN: 13.3 g/dL (ref 12.0–15.0)
Hemoglobin: 11.9 g/dL — ABNORMAL LOW (ref 12.0–15.0)

## 2017-03-17 LAB — GLUCOSE, CAPILLARY
GLUCOSE-CAPILLARY: 125 mg/dL — AB (ref 65–99)
GLUCOSE-CAPILLARY: 175 mg/dL — AB (ref 65–99)
GLUCOSE-CAPILLARY: 91 mg/dL (ref 65–99)
GLUCOSE-CAPILLARY: 93 mg/dL (ref 65–99)
Glucose-Capillary: 101 mg/dL — ABNORMAL HIGH (ref 65–99)
Glucose-Capillary: 104 mg/dL — ABNORMAL HIGH (ref 65–99)

## 2017-03-17 LAB — PROTIME-INR
INR: 2.18
Prothrombin Time: 24.1 seconds — ABNORMAL HIGH (ref 11.4–15.2)

## 2017-03-17 MED ORDER — TECHNETIUM TC 99M-LABELED RED BLOOD CELLS IV KIT
26.7000 | PACK | Freq: Once | INTRAVENOUS | Status: AC | PRN
  Administered 2017-03-17: 26.7 via INTRAVENOUS

## 2017-03-17 NOTE — Progress Notes (Signed)
Subjective: Still some slight blood in ostomy bag. No abdominal pain. Tolerating liquid diet.  Objective: Vital signs in last 24 hours: Temp:  [98 F (36.7 C)-98.5 F (36.9 C)] 98.4 F (36.9 C) (03/15 0845) Pulse Rate:  [66-82] 76 (03/15 0845) Resp:  [18] 18 (03/15 0845) BP: (138-163)/(62-107) 138/76 (03/15 0845) SpO2:  [99 %-100 %] 100 % (03/15 0845) Weight:  [168 lb 12.8 oz (76.6 kg)] 168 lb 12.8 oz (76.6 kg) (03/14 2051) Weight change: -2.5 oz (-1.433 kg) Last BM Date: 03/16/17  PE: GEN:  NAD ABD:  Soft, non-tender, old surgical scar, some maroon coating to ostomy bag.  Lab Results: CBC    Component Value Date/Time   WBC 11.2 (H) 03/14/2017 1926   RBC 4.57 03/14/2017 1926   HGB 10.8 (L) 03/17/2017 0609   HCT 35.1 (L) 03/17/2017 0609   PLT 301 03/14/2017 1926   MCV 89.3 03/14/2017 1926   MCH 28.2 03/14/2017 1926   MCHC 31.6 03/14/2017 1926   RDW 15.7 (H) 03/14/2017 1926   LYMPHSABS 3.5 03/14/2017 1926   MONOABS 0.5 03/14/2017 1926   EOSABS 0.1 03/14/2017 1926   BASOSABS 0.0 03/14/2017 1926   Assessment:  1. Hematochezia, not very impressive but persistent.  No appreciable change in Hgb. 2. Chronic anticoagulation, warfarin, currently on hold. 3. Recurrent DVT/PE. 4. Mild anemia, mild drift down in hemoglobin. 5.  Multiple medical problems.  Plan:  1.  Tagged RBC study to assess for site of bleeding. 2.  Continue to hold warfarin. 3.  NPO.  Follow CBC. 4.  If tagged study unrevealing and blood in ostomy bag persists, might have to consider colonoscopy via ostomy. 5.  [facility] will follow.   Freddy Jaksch 03/17/2017, 10:03 AM   Cell [phone] If no answer or after 5 PM call [phone]

## 2017-03-17 NOTE — Consult Note (Signed)
            [facility] CM Primary Care Navigator  03/17/2017  Hannah [name] 07/13/1940 [id number]   Went to see patient at the bedsideto identify possible discharge needs. Patient reports having "blood in colostomy bag" that had led to this admission.  Patientendorses Dr.Hannah [name] with [facility] as herprimary care provider.   [facility].   Patient reportsmanagingher ownmedications at home using "pill box" system.  Patient statesthatshe was driving prior to this admission but has 2 sons-in law [name] and [name]) and a daughter [name]) who will be able to providetransportation to herdoctors' appointments after discharge.  Patient's daughters will be her primary caregivers and other children will also assist with care if needed.  Discharge plan is to be determined pending GI evaluation and stability of hemoglobin/ hematocrit per MD note, but patient is hoping to bedischargedhome when ready.  Patient voiced understanding to call primary care provider's officewhen shereturnshomefor a post discharge follow-up visit within 1- 2 weeks or sooner if needs arise.Patient letter (with PCP's contact number) was provided as her reminder.  Discussed with patientregarding [facility] CM services available for health management and resources at home but patient verbalized having "no pressing needs, issues or concerns for now" and thinks that she is "managing it well so far".  Patientwould only opt and agree with EMMI calls to monitor recovery at home.  Referral made for [facility] General calls after discharge per patient's preference.  Patient voiced understanding of need to seekreferral from primary care provider to [facility] care management if deemednecessaryand appropriate for anyservices in thefuture.  [facility] care management information was provided for future  needs that she may have.   For additional questions please contact:  Karin Golden A. Ivelise Castillo, BSN, RN-BC [facility] PRIMARY CARE Navigator Cell: [phone]

## 2017-03-17 NOTE — Progress Notes (Signed)
PROGRESS NOTE    Hannah [name]  ZOX:[id number] DOB: 09-Nov-1940 DOA: 03/14/2017 PCP: [name], DO   Brief Narrative:  Hannah [name] is Hannah [name] 76 y.o. female with medical history significant for history of DVT and PE on warfarin, type 2 diabetes mellitus, hypertension, and sigmoid stricture status post partial colectomy in 2016, now presenting to the emergency department for evaluation of blood in her colostomy bag.  Patient reports that she been in her usual state of health until developing pain in the right flank yesterday and then noting blood in her colostomy bag.  She reports mild nausea without vomiting, denies fevers or chills.  She denies chest pain, palpitations, or lightheadedness.  She has had similar pain in the right flank previously and reports that it is worse with certain movements.  She underwent colonoscopy 1 year ago and was found to have sigmoid and descending diverticula.  She was evaluated at urgent care for these complaints yesterday, noted to have INR of 5.3, and discharged home.  She has since been to her Coumadin clinic where she was instructed to hold her warfarin for 2 days.  Bleeding persisted and she presents to the ED.  Assessment & Plan:   Principal Problem:   GI bleed Active Problems:   Hypothyroid   Essential hypertension   Diabetes mellitus with neurological manifestations, controlled (HCC)   History of pulmonary embolism   Chronic diastolic CHF (congestive heart failure) (HCC)   Acute GI bleeding  1. Acute GI bleeding  - She's had continued blood in ostomy, but H/H relatively stable (though slightly downtrending)  - Has hx of partial colectomy and diverticula were noted in sigmoid and descending colon on colonoscopy one year ago    - Plan to hold warfarin, follow H&H q8h, start Protonix BID though lower GIB seems most likely   - GI c/s, planning for tagged RBC study given persistent bleeding (may need colonoscopy) - will continue to hold warfarin  2.  History of DVT and PE  - No evidence for acute VTE  - Has IVC filter and managed with warfarin  - INR is 3.47 on admission (2.18 this AM) - She has remained hemodynamically stable, Hgb close to baseline, and so will hold warfarin and let INR trend down for now   - R>L LEE (notable for chronic thrombus - see report below)  3. Chronic diastolic CHF  - Appears well-compensated  - Hold Lasix for now - Follow daily wt and I/O's  4. Type II DM  - A1c was 6.4% earlier this month  - Managed at home with metformin, held on admission  - Follow CBG and use Novolog correctional as needed    5. Hypothyroidism  - synthroid  6. Hypertension  - resume lisinopril and metoprolol.  Hold lasix for now  # R flank pain: persistent.  seems musculoskeletal as is improved with lidocaine patch and voltaren.  Lidocaine patch.  APAP prn. Continue to monitor.   DVT prophylaxis: supratherapeutic INR, will need to start heparin or SCD's if subtherapeutic Code Status: full  Family Communication: none at bedsdie Disposition Plan: pending GI eval and stability of h/h   Consultants:   GI c/s  Procedures:   LE [place]  Antimicrobials:  Anti-infectives (From admission, onward)   None      Subjective: Flank pain, better with voltaren and lidocaine. Continued blood in ostomy.   Objective: Vitals:   03/16/17 1800 03/16/17 2051 03/17/17 0516 03/17/17 0845  BP: (!) 150/82 (!) 146/62 140/80  138/76  Pulse:  66 81 76  Resp:  18 18 18   Temp:  98.5 F (36.9 C) 98.2 F (36.8 C) 98.4 F (36.9 C)  TempSrc:  Oral Oral Oral  SpO2:  99% 99% 100%  Weight:  76.6 kg (168 lb 12.8 oz)    Height:        Intake/Output Summary (Last 24 hours) at 03/17/2017 1054 Last data filed at 03/17/2017 0844 Gross per 24 hour  Intake 1580 ml  Output 1025 ml  Net 555 ml   Filed Weights   03/15/17 1804 03/15/17 2030 03/16/17 2051  Weight: 78 kg (171 lb 15.3 oz) 80.7 kg (177 lb 14.4 oz) 76.6 kg (168 lb 12.8 oz)     Examination:  General: No acute distress. Cardiovascular: Heart sounds show Annaliza Zia regular rate, and rhythm. No gallops or rubs. No murmurs. No JVD. Lungs: Clear to auscultation bilaterally with good air movement. No rales, rhonchi or wheezes. Abdomen: Soft, nontender, nondistended with normal active bowel sounds. No masses. No hepatosplenomegaly.  Maroon stool in ostomy. Neurological: Alert and oriented 3. Moves all extremities 4 with equal strength. Cranial nerves II through XII grossly intact. Skin: Warm and dry. No rashes or lesions. Extremities: No clubbing or cyanosis. R>L LEE.  Psychiatric: Mood and affect are normal. Insight and judgment are appropirate.   Data Reviewed: I have personally reviewed following labs and imaging studies  CBC: Recent Labs  Lab 03/14/17 1926  03/15/17 1837 03/16/17 0203 03/16/17 1137 03/16/17 2048 03/17/17 0609  WBC 11.2*  --   --   --   --   --   --   NEUTROABS 7.1  --   --   --   --   --   --   HGB 12.9   < > 12.4 11.0* 12.1 11.7* 10.8*  HCT 40.8   < > 39.3 35.1* 38.9 36.5 35.1*  MCV 89.3  --   --   --   --   --   --   PLT 301  --   --   --   --   --   --    < > = values in this interval not displayed.   Basic Metabolic Panel: Recent Labs  Lab 03/14/17 1926 03/15/17 0352 03/16/17 0203 03/17/17 0609  NA 140 141 141 141  K 3.9 4.0 4.0 3.9  CL 105 108 109 108  CO2 25 23 25 25   GLUCOSE 127* 96 100* 109*  BUN 12 14 13 10   CREATININE 1.07* 0.91 0.90 0.97  CALCIUM 9.5 9.4 8.7* 8.9   GFR: Estimated Creatinine Clearance: 53.5 mL/min (by C-G formula based on SCr of 0.97 mg/dL). Liver Function Tests: Recent Labs  Lab 03/14/17 1926 03/16/17 0203  AST 23 16  ALT 11* 10*  ALKPHOS 53 40  BILITOT 0.6 0.8  PROT 6.9 5.5*  ALBUMIN 3.5 2.8*   No results for input(s): LIPASE, AMYLASE in the last 168 hours. No results for input(s): AMMONIA in the last 168 hours. Coagulation Profile: Recent Labs  Lab 03/14/17 1357 03/14/17 1926  03/15/17 0352 03/16/17 0203 03/17/17 0609  INR 5.3 3.47 3.58 3.22 2.18   Cardiac Enzymes: No results for input(s): CKTOTAL, CKMB, CKMBINDEX, TROPONINI in the last 168 hours. BNP (last 3 results) No results for input(s): PROBNP in the last 8760 hours. HbA1C: No results for input(s): HGBA1C in the last 72 hours. CBG: Recent Labs  Lab 03/16/17 1728 03/16/17 2024 03/17/17 0008 03/17/17 2130 03/17/17 0745  GLUCAP 107* 128* 91 101* 104*   Lipid Profile: No results for input(s): CHOL, HDL, LDLCALC, TRIG, CHOLHDL, LDLDIRECT in the last 72 hours. Thyroid Function Tests: No results for input(s): TSH, T4TOTAL, FREET4, T3FREE, THYROIDAB in the last 72 hours. Anemia Panel: No results for input(s): VITAMINB12, FOLATE, FERRITIN, TIBC, IRON, RETICCTPCT in the last 72 hours. Sepsis Labs: No results for input(s): PROCALCITON, LATICACIDVEN in the last 168 hours.  Recent Results (from the past 240 hour(s))  Urine culture     Status: Abnormal   Collection Time: 03/14/17 12:55 PM  Result Value Ref Range Status   Specimen Description URINE, RANDOM  Final   Special Requests NONE  Final   Culture (Nataliah Hatlestad)  Final    <10,000 COLONIES/mL INSIGNIFICANT GROWTH Performed at [facility], [address], [place], [place] [address]    Report Status 03/15/2017 FINAL  Final         Radiology Studies: No results found.      Scheduled Meds: . diclofenac sodium  2 g Topical QID  . insulin aspart  0-9 Units Subcutaneous Q4H  . levothyroxine  25 mcg Oral QAC breakfast  . lidocaine  1 patch Transdermal Q24H  . lisinopril  40 mg Oral Daily  . metoprolol tartrate  25 mg Oral BID  . pantoprazole (PROTONIX) IV  40 mg Intravenous Q12H  . sodium chloride flush  3 mL Intravenous Q12H   Continuous Infusions:   LOS: 2 days    Time spent: over 30 min    Lacretia Nicks, MD [facility] Pager [phone]  If 7PM-7AM, please contact night-coverage [email or web address] Password  [facility] 03/17/2017, 10:54 AM

## 2017-03-18 LAB — BASIC METABOLIC PANEL
Anion gap: 10 (ref 5–15)
BUN: 10 mg/dL (ref 6–20)
CO2: 25 mmol/L (ref 22–32)
CREATININE: 1.03 mg/dL — AB (ref 0.44–1.00)
Calcium: 9 mg/dL (ref 8.9–10.3)
Chloride: 107 mmol/L (ref 101–111)
GFR calc non Af Amer: 52 mL/min — ABNORMAL LOW (ref >60)
GLUCOSE: 101 mg/dL — AB (ref 65–99)
Potassium: 3.8 mmol/L (ref 3.5–5.1)
Sodium: 142 mmol/L (ref 135–145)

## 2017-03-18 LAB — HEMOGLOBIN AND HEMATOCRIT, BLOOD
HCT: 40.8 % (ref 36.0–46.0)
HEMATOCRIT: 37.4 % (ref 36.0–46.0)
HEMATOCRIT: 38.5 % (ref 36.0–46.0)
HEMOGLOBIN: 11.7 g/dL — AB (ref 12.0–15.0)
Hemoglobin: 12 g/dL (ref 12.0–15.0)
Hemoglobin: 12.7 g/dL (ref 12.0–15.0)

## 2017-03-18 LAB — GLUCOSE, CAPILLARY
GLUCOSE-CAPILLARY: 103 mg/dL — AB (ref 65–99)
GLUCOSE-CAPILLARY: 105 mg/dL — AB (ref 65–99)
GLUCOSE-CAPILLARY: 125 mg/dL — AB (ref 65–99)
Glucose-Capillary: 99 mg/dL (ref 65–99)
Glucose-Capillary: 107 mg/dL — ABNORMAL HIGH (ref 65–99)

## 2017-03-18 LAB — PROTIME-INR
INR: 1.8
Prothrombin Time: 20.7 seconds — ABNORMAL HIGH (ref 11.4–15.2)

## 2017-03-18 MED ORDER — WARFARIN SODIUM 2.5 MG PO TABS
2.5000 mg | ORAL_TABLET | Freq: Once | ORAL | Status: AC
Start: 2017-03-18 — End: 2017-03-18
  Administered 2017-03-18: 2.5 mg via ORAL
  Filled 2017-03-18: qty 1

## 2017-03-18 MED ORDER — POLYETHYLENE GLYCOL 3350 17 G PO PACK
17.0000 g | PACK | Freq: Three times a day (TID) | ORAL | Status: DC
  Administered 2017-03-18 – 2017-03-19 (×5): 17 g via ORAL
  Filled 2017-03-18 (×5): qty 1

## 2017-03-18 MED ORDER — WARFARIN - PHARMACIST DOSING INPATIENT: Freq: Every day | Status: DC

## 2017-03-18 NOTE — Progress Notes (Signed)
ANTICOAGULATION CONSULT NOTE - Initial Consult  Pharmacy Consult for coumadin Indication: DVT  Allergies  Allergen Reactions  . Augmentin [Amoxicillin-Pot Clavulanate] Other (See Comments)    Severe vaginal itching  . Penicillins   . Tranxene [Clorazepate] Itching    Patient Measurements: Height: 5\' 7"  (170.2 cm) Weight: 168 lb 14 oz (76.6 kg) IBW/kg (Calculated) : 61.6  Vital Signs: Temp: 98 F (36.7 C) (03/16 1707) Temp Source: Oral (03/16 1707) BP: 170/85 (03/16 1707) Pulse Rate: 72 (03/16 1707)  Labs: Recent Labs    03/16/17 0203  03/17/17 0609  03/17/17 1955 03/18/17 0725 03/18/17 1231  HGB 11.0*   < > 10.8*   < > 11.9* 11.7* 12.0  HCT 35.1*   < > 35.1*   < > 38.3 37.4 38.5  LABPROT 32.6*  --  24.1*  --   --  20.7*  --   INR 3.22  --  2.18  --   --  1.80  --   CREATININE 0.90  --  0.97  --   --  1.03*  --    < > = values in this interval not displayed.    Estimated Creatinine Clearance: 50.4 mL/min (A) (by C-G formula based on SCr of 1.03 mg/dL (H)).   Medical History: Past Medical History:  Diagnosis Date  . Acute massive pulmonary embolism (HCC) 07/13/2012   Massive PE w/ PEA arrest 07/13/12 >TNK >IVC filter >discharged on comadin    . Diabetes mellitus, type 2 (HCC) 07/15/2012   with peripheral neuropathy  . Hyperlipemia 07/14/2012  . Hypertension 07/14/2012  . Large bowel stricture (HCC)    s/p colectomy in 2016  . Osteoarthritis    s/p hip and knee replacements  . Thyroid disease     Assessment: 32 YOF with hx of DVT/PE on coumadin PTA, admitted with blood in colostomy bag. Coumadin has been on hold since 3/12 d/t supratherapeutic INR. hgb stable at 12, INR 1.8, pharmacy is consulted to restart coumadin.  PTA dose: 2.5 mg on TTS, 5mg  on AOD  Goal of Therapy:  INR 2.5-3.5 (per coumadin clinic note) Monitor platelets by anticoagulation protocol: Yes   Plan:  - Coumadin 2.5 mg po x 1 - f/u daily INR  Bayard Hugger, PharmD, BCPS  Clinical  Pharmacist  Pager: [phone]   03/18/2017,5:25 PM

## 2017-03-18 NOTE — Progress Notes (Signed)
PROGRESS NOTE    Lyanna Bode  FHQ:[id number] DOB: 1940/04/10 DOA: 03/14/2017 PCP: [name], DO   Brief Narrative:  Hannah [name] is [place] 77 y.o. female with medical history significant for history of DVT and PE on warfarin, type 2 diabetes mellitus, hypertension, and sigmoid stricture status post partial colectomy in 2016, now presenting to the emergency department for evaluation of blood in her colostomy bag.  Patient reports that she been in her usual state of health until developing pain in the right flank yesterday and then noting blood in her colostomy bag.  She reports mild nausea without vomiting, denies fevers or chills.  She denies chest pain, palpitations, or lightheadedness.  She has had similar pain in the right flank previously and reports that it is worse with certain movements.  She underwent colonoscopy 1 year ago and was found to have sigmoid and descending diverticula.  She was evaluated at urgent care for these complaints yesterday, noted to have INR of 5.3, and discharged home.  She has since been to her Coumadin clinic where she was instructed to hold her warfarin for 2 days.  Bleeding persisted and she presents to the ED.  Assessment & Plan:   Principal Problem:   GI bleed Active Problems:   Hypothyroid   Essential hypertension   Diabetes mellitus with neurological manifestations, controlled (HCC)   History of pulmonary embolism   Chronic diastolic CHF (congestive heart failure) (HCC)   Acute GI bleeding  1. Acute GI bleeding  - She's had continued blood in ostomy, but H/H relatively stable (though slightly downtrending)  - Has hx of partial colectomy and diverticula were noted in sigmoid and descending colon on colonoscopy one year ago    - Plan to hold warfarin, follow H&H q8h, start Protonix BID though lower GIB seems most likely   - GI c/s, tagged RBC study without evidence of bleeding. CTM at least another day  - restarting warfarin as noted below  2. History  of DVT and PE  - No evidence for acute VTE  - Has IVC filter and managed with warfarin  - INR is 3.47 on admission (subtherapeutic today) - She has remained hemodynamically stable, Hgb close to baseline - R>L LEE (notable for chronic thrombus - see report below) - restarting warfarin today  3. Chronic diastolic CHF  - Appears well-compensated  - Hold Lasix for now - Follow daily wt and I/O's  4. Type II DM  - A1c was 6.4% earlier this month  - Managed at home with metformin, held on admission  - Follow CBG and use Novolog correctional as needed    5. Hypothyroidism  - synthroid  6. Hypertension  - resume lisinopril and metoprolol.  Hold lasix for now  # R flank pain: stable.  seems musculoskeletal as is improved with lidocaine patch and voltaren.  Lidocaine patch.  APAP prn. Continue to monitor.   DVT prophylaxis: subtherapeutic INR, starting warfarin today given recent bleeding rather than bridge Code Status: full  Family Communication: discussed with son yesterday Disposition Plan: pending GI eval and stability of h/h   Consultants:   GI c/s  Procedures:   LE [place]  Antimicrobials:  Anti-infectives (From admission, onward)   None      Subjective: Some continued blood. Persistent flank pain.   Objective: Vitals:   03/17/17 2024 03/18/17 0512 03/18/17 1000 03/18/17 1707  BP: (!) 143/109 (!) 157/66 (!) 153/75 (!) 170/85  Pulse: 76 67 80 72  Resp: 18 18  18 18  Temp: 98 F (36.7 C) 98 F (36.7 C) 98.6 F (37 C) 98 F (36.7 C)  TempSrc: Oral Oral Oral Oral  SpO2: 100% 100% 100% 100%  Weight: 76.6 kg (168 lb 14 oz)     Height:        Intake/Output Summary (Last 24 hours) at 03/18/2017 1916 Last data filed at 03/18/2017 1337 Gross per 24 hour  Intake 750 ml  Output 100 ml  Net 650 ml   Filed Weights   03/15/17 2030 03/16/17 2051 03/17/17 2024  Weight: 80.7 kg (177 lb 14.4 oz) 76.6 kg (168 lb 12.8 oz) 76.6 kg (168 lb 14 oz)     Examination:  General: No acute distress. Cardiovascular: Heart sounds show Radhika Dershem regular rate, and rhythm. No gallops or rubs. No murmurs. No JVD. Lungs: Clear to auscultation bilaterally with good air movement. No rales, rhonchi or wheezes. Abdomen: Soft, nontender, nondistended with normal active bowel sounds. No masses. No hepatosplenomegaly.  Recently emptied ostomy.  TTP at R flank Neurological: Alert and oriented 3. Moves all extremities 4. Cranial nerves II through XII grossly intact. Skin: Warm and dry. No rashes or lesions. Extremities: No clubbing or cyanosis. R>L edema Psychiatric: Mood and affect are normal. Insight and judgment are appropriate.   Data Reviewed: I have personally reviewed following labs and imaging studies  CBC: Recent Labs  Lab 03/14/17 1926  03/17/17 0609 03/17/17 1701 03/17/17 1955 03/18/17 0725 03/18/17 1231  WBC 11.2*  --   --   --   --   --   --   NEUTROABS 7.1  --   --   --   --   --   --   HGB 12.9   < > 10.8* 13.3 11.9* 11.7* 12.0  HCT 40.8   < > 35.1* 40.8 38.3 37.4 38.5  MCV 89.3  --   --   --   --   --   --   PLT 301  --   --   --   --   --   --    < > = values in this interval not displayed.   Basic Metabolic Panel: Recent Labs  Lab 03/14/17 1926 03/15/17 0352 03/16/17 0203 03/17/17 0609 03/18/17 0725  NA 140 141 141 141 142  K 3.9 4.0 4.0 3.9 3.8  CL 105 108 109 108 107  CO2 25 23 25 25 25   GLUCOSE 127* 96 100* 109* 101*  BUN 12 14 13 10 10   CREATININE 1.07* 0.91 0.90 0.97 1.03*  CALCIUM 9.5 9.4 8.7* 8.9 9.0   GFR: Estimated Creatinine Clearance: 50.4 mL/min (Nolen Lindamood) (by C-G formula based on SCr of 1.03 mg/dL (H)). Liver Function Tests: Recent Labs  Lab 03/14/17 1926 03/16/17 0203  AST 23 16  ALT 11* 10*  ALKPHOS 53 40  BILITOT 0.6 0.8  PROT 6.9 5.5*  ALBUMIN 3.5 2.8*   No results for input(s): LIPASE, AMYLASE in the last 168 hours. No results for input(s): AMMONIA in the last 168 hours. Coagulation  Profile: Recent Labs  Lab 03/14/17 1926 03/15/17 0352 03/16/17 0203 03/17/17 0609 03/18/17 0725  INR 3.47 3.58 3.22 2.18 1.80   Cardiac Enzymes: No results for input(s): CKTOTAL, CKMB, CKMBINDEX, TROPONINI in the last 168 hours. BNP (last 3 results) No results for input(s): PROBNP in the last 8760 hours. HbA1C: No results for input(s): HGBA1C in the last 72 hours. CBG: Recent Labs  Lab 03/17/17 2022 03/18/17 0002 03/18/17 0506 03/18/17  1147 03/18/17 1613  GLUCAP 175* 103* 99 105* 107*   Lipid Profile: No results for input(s): CHOL, HDL, LDLCALC, TRIG, CHOLHDL, LDLDIRECT in the last 72 hours. Thyroid Function Tests: No results for input(s): TSH, T4TOTAL, FREET4, T3FREE, THYROIDAB in the last 72 hours. Anemia Panel: No results for input(s): VITAMINB12, FOLATE, FERRITIN, TIBC, IRON, RETICCTPCT in the last 72 hours. Sepsis Labs: No results for input(s): PROCALCITON, LATICACIDVEN in the last 168 hours.  Recent Results (from the past 240 hour(s))  Urine culture     Status: Abnormal   Collection Time: 03/14/17 12:55 PM  Result Value Ref Range Status   Specimen Description URINE, RANDOM  Final   Special Requests NONE  Final   Culture (Chalon Zobrist)  Final    <10,000 COLONIES/mL INSIGNIFICANT GROWTH Performed at [facility], [address], [place], [place] [address]    Report Status 03/15/2017 FINAL  Final         Radiology Studies: Nm Gi Blood Loss  Result Date: 03/17/2017 CLINICAL DATA:  GI bleed. History of partial colectomy with left lower quadrant colostomy. Blood in colostomy bag. EXAM: NUCLEAR MEDICINE GASTROINTESTINAL BLEEDING SCAN TECHNIQUE: Sequential abdominal images were obtained following intravenous administration of Tc-15m labeled red blood cells. RADIOPHARMACEUTICALS:  26.7 mCi Tc-68m pertechnetate in-vitro labeled red cells. COMPARISON:  None. FINDINGS: Imaging was performed over 2 hours. Normal blood pool activity is seen. There is at most subtle activity  in the left mid to lower abdomen at the beginning of the scan which does not increase over time or clearly correspond to bowel. There is no evidence of active GI bleeding elsewhere. IMPRESSION: No active GI bleed identified. Electronically Signed   By: Sebastian Ache M.D.   On: 03/17/2017 15:51        Scheduled Meds: . diclofenac sodium  2 g Topical QID  . insulin aspart  0-9 Units Subcutaneous Q4H  . levothyroxine  25 mcg Oral QAC breakfast  . lidocaine  1 patch Transdermal Q24H  . lisinopril  40 mg Oral Daily  . metoprolol tartrate  25 mg Oral BID  . pantoprazole (PROTONIX) IV  40 mg Intravenous Q12H  . polyethylene glycol  17 g Oral TID  . sodium chloride flush  3 mL Intravenous Q12H  . Warfarin - Pharmacist Dosing Inpatient   Does not apply q1800   Continuous Infusions:   LOS: 3 days    Time spent: over 30 min    Lacretia Nicks, MD [facility] Pager [phone]  If 7PM-7AM, please contact night-coverage [email or web address] Password [facility] 03/18/2017, 7:16 PM

## 2017-03-18 NOTE — Progress Notes (Addendum)
[facility] PROGRESS NOTE Subjective Patient states that her stool still is a little abnormal. She's had a history of colostomy and sigmoid colon resection due to colonic stricture due to diverticulosis. Had bleeding last year and underwent colonoscopy by Dr. Madilyn Fireman which was normal other than diverticulosis. This was done via the colostomy. She's back in now with bleeding which began with bright red blood intermittently. She was anticoagulated due to a history of pulmonary embolus and does have an IVC filter  Objective: Vital signs in last 24 hours: Temp:  [97.7 F (36.5 C)-98.4 F (36.9 C)] 98 F (36.7 C) (03/16 0512) Pulse Rate:  [67-83] 67 (03/16 0512) Resp:  [18] 18 (03/16 0512) BP: (138-189)/(66-109) 157/66 (03/16 0512) SpO2:  [100 %] 100 % (03/16 0512) Weight:  [76.6 kg (168 lb 14 oz)] 76.6 kg (168 lb 14 oz) (03/15 2024) Last BM Date: 03/17/17  Intake/Output from previous day: 03/15 0701 - 03/16 0700 In: 490 [P.O.:490] Out: 300 [Urine:200; Stool:100] Intake/Output this shift: No intake/output data recorded.  PE: General--alert no distress  Lungs--clear Abdomen--soft and nontender. Stool in the colostomy bag [facility] not bright red in their no clots.  Lab Results: Recent Labs    03/16/17 2048 03/17/17 0609 03/17/17 1701 03/17/17 1955 03/18/17 0725  HGB 11.7* 10.8* 13.3 11.9* 11.7*  HCT 36.5 35.1* 40.8 38.3 37.4   BMET Recent Labs    03/16/17 0203 03/17/17 0609  NA 141 141  K 4.0 3.9  CL 109 108  CO2 25 25  CREATININE 0.90 0.97   LFT Recent Labs    03/16/17 0203  PROT 5.5*  AST 16  ALT 10*  ALKPHOS 40  BILITOT 0.8   PT/INR Recent Labs    03/16/17 0203 03/17/17 0609  LABPROT 32.6* 24.1*  INR 3.22 2.18   PANCREAS No results for input(s): LIPASE in the last 72 hours.       Studies/Results: Nm Gi Blood Loss  Result Date: 03/17/2017 CLINICAL DATA:  GI bleed. History of partial colectomy with left lower quadrant  colostomy. Blood in colostomy bag. EXAM: NUCLEAR MEDICINE GASTROINTESTINAL BLEEDING SCAN TECHNIQUE: Sequential abdominal images were obtained following intravenous administration of Tc-51m labeled red blood cells. RADIOPHARMACEUTICALS:  26.7 mCi Tc-30m pertechnetate in-vitro labeled red cells. COMPARISON:  None. FINDINGS: Imaging was performed over 2 hours. Normal blood pool activity is seen. There is at most subtle activity in the left mid to lower abdomen at the beginning of the scan which does not increase over time or clearly correspond to bowel. There is no evidence of active GI bleeding elsewhere. IMPRESSION: No active GI bleed identified. Electronically Signed   By: Sebastian Ache M.D.   On: 03/17/2017 15:51    Medications: I have reviewed the patient's current medications.  Assessment:   1. Lower G.I. Bleed. Almost certainly diverticular. Hemoglobin stable in the bleeding seems to be clearing up. She just had a colonoscopy a year ago and if she stops bleeding I do not feel that we need to repeat that.   Plan: Would follow hemoglobin at least one more day and keep her own clear liquids. I will add Miralax to try to clean out any old blood in her: Cepheid is not clear up. If she does clear up she could be discharged and followed up in the future by Dr. Dulce Sellar.  Note bleeding scan was negative  Hannah [name] 03/18/2017, 8:09 AM  This note was created using voice recognition software. Minor errors may Have occurred unintentionally.  Pager: [phone] If no answer or after hours call [phone]

## 2017-03-19 ENCOUNTER — Inpatient Hospital Stay (HOSPITAL_COMMUNITY): Payer: Medicare Other

## 2017-03-19 ENCOUNTER — Encounter (HOSPITAL_COMMUNITY): Payer: Self-pay

## 2017-03-19 LAB — COMPREHENSIVE METABOLIC PANEL
ALBUMIN: 2.8 g/dL — AB (ref 3.5–5.0)
ALK PHOS: 40 U/L (ref 38–126)
ALT: 11 U/L — AB (ref 14–54)
AST: 17 U/L (ref 15–41)
Anion gap: 9 (ref 5–15)
BILIRUBIN TOTAL: 0.7 mg/dL (ref 0.3–1.2)
BUN: 8 mg/dL (ref 6–20)
CO2: 25 mmol/L (ref 22–32)
CREATININE: 1.03 mg/dL — AB (ref 0.44–1.00)
Calcium: 8.9 mg/dL (ref 8.9–10.3)
Chloride: 107 mmol/L (ref 101–111)
GFR calc Af Amer: >60 mL/min (ref >60)
GFR, EST NON AFRICAN AMERICAN: 52 mL/min — AB (ref >60)
GLUCOSE: 100 mg/dL — AB (ref 65–99)
Potassium: 3.8 mmol/L (ref 3.5–5.1)
Sodium: 141 mmol/L (ref 135–145)
TOTAL PROTEIN: 5.3 g/dL — AB (ref 6.5–8.1)

## 2017-03-19 LAB — HEMOGLOBIN AND HEMATOCRIT, BLOOD
HCT: 36.7 % (ref 36.0–46.0)
HCT: 40.3 % (ref 36.0–46.0)
Hemoglobin: 11.3 g/dL — ABNORMAL LOW (ref 12.0–15.0)
Hemoglobin: 12.6 g/dL (ref 12.0–15.0)

## 2017-03-19 LAB — PROTIME-INR
INR: 1.73
Prothrombin Time: 20.1 seconds — ABNORMAL HIGH (ref 11.4–15.2)

## 2017-03-19 LAB — GLUCOSE, CAPILLARY
GLUCOSE-CAPILLARY: 120 mg/dL — AB (ref 65–99)
Glucose-Capillary: 98 mg/dL (ref 65–99)
Glucose-Capillary: 96 mg/dL (ref 65–99)
Glucose-Capillary: 97 mg/dL (ref 65–99)
Glucose-Capillary: 97 mg/dL (ref 65–99)

## 2017-03-19 LAB — MAGNESIUM: MAGNESIUM: 1.7 mg/dL (ref 1.7–2.4)

## 2017-03-19 MED ORDER — DICLOFENAC SODIUM 1 % TD GEL: 2.0000 g | Freq: Four times a day (QID) | TRANSDERMAL | 0 refills | Status: AC

## 2017-03-19 MED ORDER — POLYETHYLENE GLYCOL 3350 17 G PO PACK: 17.0000 g | PACK | Freq: Every day | ORAL | 0 refills | Status: DC

## 2017-03-19 MED ORDER — IOPAMIDOL (ISOVUE-300) INJECTION 61%
INTRAVENOUS | Status: AC
  Administered 2017-03-19: 100 mL
  Filled 2017-03-19: qty 100

## 2017-03-19 MED ORDER — IOPAMIDOL (ISOVUE-300) INJECTION 61%
INTRAVENOUS | Status: AC
  Filled 2017-03-19: qty 30

## 2017-03-19 MED ORDER — TRAMADOL HCL 50 MG PO TABS
50.0000 mg | ORAL_TABLET | Freq: Four times a day (QID) | ORAL | Status: DC | PRN
  Administered 2017-03-19: 50 mg via ORAL
  Filled 2017-03-19: qty 1

## 2017-03-19 MED ORDER — WARFARIN SODIUM 5 MG PO TABS
5.0000 mg | ORAL_TABLET | Freq: Once | ORAL | Status: AC
  Administered 2017-03-19: 5 mg via ORAL
  Filled 2017-03-19: qty 1

## 2017-03-19 MED ORDER — SODIUM CHLORIDE 0.9 % IV SOLN
INTRAVENOUS | Status: DC
  Administered 2017-03-19: 11:00:00 via INTRAVENOUS

## 2017-03-19 MED ORDER — KETOROLAC TROMETHAMINE 30 MG/ML IJ SOLN
30.0000 mg | Freq: Once | INTRAMUSCULAR | Status: AC
  Administered 2017-03-19: 30 mg via INTRAVENOUS
  Filled 2017-03-19: qty 1

## 2017-03-19 NOTE — Progress Notes (Signed)
PROGRESS NOTE    Brigida Scotti  ZOX:[id number] DOB: [birth year] DOA: 03/14/2017 PCP: [name], DO   Brief Narrative:  Alysa Duca is a 77 y.o. female with medical history significant for history of DVT and PE on warfarin, type 2 diabetes mellitus, hypertension, and sigmoid stricture status post partial colectomy in 2016, now presenting to the emergency department for evaluation of blood in her colostomy bag.  Patient reports that she been in her usual state of health until developing pain in the right flank yesterday and then noting blood in her colostomy bag.  She reports mild nausea without vomiting, denies fevers or chills.  She denies chest pain, palpitations, or lightheadedness.  She has had similar pain in the right flank previously and reports that it is worse with certain movements.  She underwent colonoscopy 1 year ago and was found to have sigmoid and descending diverticula.  She was evaluated at urgent care for these complaints yesterday, noted to have INR of 5.3, and discharged home.  She has since been to her Coumadin clinic where she was instructed to hold her warfarin for 2 days.  Bleeding persisted and she presents to the ED.  Assessment & Plan:   Principal Problem:   GI bleed Active Problems:   Hypothyroid   Essential hypertension   Diabetes mellitus with neurological manifestations, controlled (HCC)   History of pulmonary embolism   Chronic diastolic CHF (congestive heart failure) (HCC)   Acute GI bleeding  1. Acute GI bleeding  - She's had continued blood in ostomy, but H/H relatively stable (though slightly downtrending)  - Has hx of partial colectomy and diverticula were noted in sigmoid and descending colon on colonoscopy one year ago    - Plan to hold warfarin, follow H&H q8h, start Protonix BID though lower GIB seems most likely   - GI c/s, tagged RBC study without evidence of bleeding. May consider d/c today given brown stool in ostomy and relatively stable H/H.     - continue warfarin  2. History of DVT and PE  - No evidence for acute VTE  - Has IVC filter and managed with warfarin  - INR is 3.47 on admission (subtherapeutic today) - She has remained hemodynamically stable, Hgb close to baseline - R>L LEE (notable for chronic thrombus - see report below) - continue warfarin today  3. Chronic diastolic CHF  - Appears well-compensated  - Hold Lasix for now - Follow daily wt and I/O's  4. Type II DM  - A1c was 6.4% earlier this month  - Managed at home with metformin, held on admission  - Follow CBG and use Novolog correctional as needed    5. Hypothyroidism  - synthroid  6. Hypertension  - resume lisinopril and metoprolol.  Hold lasix for now  # RUQ Pain:  Persistent pain, noted in RUQ today, will f/u CT Abdomen pelvis.  Suspected MSK as improved with lidocaine and voltaren and seemed to be pain on lower ribs, but with worsening will f/u imaging. stable.  seems musculoskeletal as is improved with lidocaine patch and voltaren.  Lidocaine patch.  APAP prn. Continue to monitor.   DVT prophylaxis: subtherapeutic INR, starting warfarin today given recent bleeding rather than bridge Code Status: full  Family Communication: discussed with son previously in hospitalization Disposition Plan: pending GI eval and stability of h/h   Consultants:   GI c/s  Procedures:   LE [place]  Antimicrobials:  Anti-infectives (From admission, onward)   None  Subjective: Blood seems to be imrpoving Persistent flank pain, worsening last night.  Ok right now.   Objective: Vitals:   03/18/17 1707 03/18/17 2015 03/19/17 0433 03/19/17 0914  BP: (!) 170/85 (!) 158/95 (!) 151/77 138/61  Pulse: 72 61 71 76  Resp: 18 18 17 18   Temp: 98 F (36.7 C) 98.3 F (36.8 C) 98 F (36.7 C) 97.7 F (36.5 C)  TempSrc: Oral Oral Oral Oral  SpO2: 100% 100% 100% 100%  Weight:  76.5 kg (168 lb 11.2 oz)    Height:        Intake/Output Summary (Last 24  hours) at 03/19/2017 1024 Last data filed at 03/19/2017 0900 Gross per 24 hour  Intake 1200 ml  Output 1050 ml  Net 150 ml   Filed Weights   03/16/17 2051 03/17/17 2024 03/18/17 2015  Weight: 76.6 kg (168 lb 12.8 oz) 76.6 kg (168 lb 14 oz) 76.5 kg (168 lb 11.2 oz)    Examination:  General: No acute distress. Cardiovascular: Heart sounds show a regular rate, and rhythm. No gallops or rubs. No murmurs. No JVD. Lungs: Clear to auscultation bilaterally with good air movement. No rales, rhonchi or wheezes. Abdomen: Soft, TTP with deep palpation to inferior costal border in midaxillary line and RUQ, nondistended with normal active bowel sounds. No masses. No hepatosplenomegaly.  Ostomy with brown stool Neurological: Alert and oriented 3. Moves all extremities 4. Cranial nerves II through XII grossly intact. Skin: Warm and dry. No rashes or lesions. Extremities: No clubbing or cyanosis. R>L LEE Psychiatric: Mood and affect are normal. Insight and judgment are appropriate.    Data Reviewed: I have personally reviewed following labs and imaging studies  CBC: Recent Labs  Lab 03/14/17 1926  03/17/17 1955 03/18/17 0725 03/18/17 1231 03/18/17 2001 03/19/17 0507  WBC 11.2*  --   --   --   --   --   --   NEUTROABS 7.1  --   --   --   --   --   --   HGB 12.9   < > 11.9* 11.7* 12.0 12.7 11.3*  HCT 40.8   < > 38.3 37.4 38.5 40.8 36.7  MCV 89.3  --   --   --   --   --   --   PLT 301  --   --   --   --   --   --    < > = values in this interval not displayed.   Basic Metabolic Panel: Recent Labs  Lab 03/15/17 0352 03/16/17 0203 03/17/17 0609 03/18/17 0725 03/19/17 0507  NA 141 141 141 142 141  K 4.0 4.0 3.9 3.8 3.8  CL 108 109 108 107 107  CO2 23 25 25 25 25   GLUCOSE 96 100* 109* 101* 100*  BUN 14 13 10 10 8   CREATININE 0.91 0.90 0.97 1.03* 1.03*  CALCIUM 9.4 8.7* 8.9 9.0 8.9  MG  --   --   --   --  1.7   GFR: Estimated Creatinine Clearance: 50.4 mL/min (A) (by C-G formula  based on SCr of 1.03 mg/dL (H)). Liver Function Tests: Recent Labs  Lab 03/14/17 1926 03/16/17 0203 03/19/17 0507  AST 23 16 17   ALT 11* 10* 11*  ALKPHOS 53 40 40  BILITOT 0.6 0.8 0.7  PROT 6.9 5.5* 5.3*  ALBUMIN 3.5 2.8* 2.8*   No results for input(s): LIPASE, AMYLASE in the last 168 hours. No results for input(s): AMMONIA in  the last 168 hours. Coagulation Profile: Recent Labs  Lab 03/15/17 0352 03/16/17 0203 03/17/17 0609 03/18/17 0725 03/19/17 0507  INR 3.58 3.22 2.18 1.80 1.73   Cardiac Enzymes: No results for input(s): CKTOTAL, CKMB, CKMBINDEX, TROPONINI in the last 168 hours. BNP (last 3 results) No results for input(s): PROBNP in the last 8760 hours. HbA1C: No results for input(s): HGBA1C in the last 72 hours. CBG: Recent Labs  Lab 03/18/17 1613 03/18/17 2010 03/19/17 0019 03/19/17 0430 03/19/17 0736  GLUCAP 107* 125* 120* 98 96   Lipid Profile: No results for input(s): CHOL, HDL, LDLCALC, TRIG, CHOLHDL, LDLDIRECT in the last 72 hours. Thyroid Function Tests: No results for input(s): TSH, T4TOTAL, FREET4, T3FREE, THYROIDAB in the last 72 hours. Anemia Panel: No results for input(s): VITAMINB12, FOLATE, FERRITIN, TIBC, IRON, RETICCTPCT in the last 72 hours. Sepsis Labs: No results for input(s): PROCALCITON, LATICACIDVEN in the last 168 hours.  Recent Results (from the past 240 hour(s))  Urine culture     Status: Abnormal   Collection Time: 03/14/17 12:55 PM  Result Value Ref Range Status   Specimen Description URINE, RANDOM  Final   Special Requests NONE  Final   Culture (A)  Final    <10,000 COLONIES/mL INSIGNIFICANT GROWTH Performed at [facility], [address], [place], [place] [address]    Report Status 03/15/2017 FINAL  Final         Radiology Studies: Nm Gi Blood Loss  Result Date: 03/17/2017 CLINICAL DATA:  GI bleed. History of partial colectomy with left lower quadrant colostomy. Blood in colostomy bag. EXAM: NUCLEAR  MEDICINE GASTROINTESTINAL BLEEDING SCAN TECHNIQUE: Sequential abdominal images were obtained following intravenous administration of Tc-77m labeled red blood cells. RADIOPHARMACEUTICALS:  26.7 mCi Tc-27m pertechnetate in-vitro labeled red cells. COMPARISON:  None. FINDINGS: Imaging was performed over 2 hours. Normal blood pool activity is seen. There is at most subtle activity in the left mid to lower abdomen at the beginning of the scan which does not increase over time or clearly correspond to bowel. There is no evidence of active GI bleeding elsewhere. IMPRESSION: No active GI bleed identified. Electronically Signed   By: Sebastian Ache M.D.   On: 03/17/2017 15:51        Scheduled Meds: . diclofenac sodium  2 g Topical QID  . insulin aspart  0-9 Units Subcutaneous Q4H  . levothyroxine  25 mcg Oral QAC breakfast  . lidocaine  1 patch Transdermal Q24H  . lisinopril  40 mg Oral Daily  . metoprolol tartrate  25 mg Oral BID  . pantoprazole (PROTONIX) IV  40 mg Intravenous Q12H  . polyethylene glycol  17 g Oral TID  . sodium chloride flush  3 mL Intravenous Q12H  . warfarin  5 mg Oral ONCE-1800  . Warfarin - Pharmacist Dosing Inpatient   Does not apply q1800   Continuous Infusions: . sodium chloride       LOS: 4 days    Time spent: over 30 min    Lacretia Nicks, MD [facility] Pager [phone]  If 7PM-7AM, please contact night-coverage [email or web address] Password [facility] 03/19/2017, 10:24 AM

## 2017-03-19 NOTE — Progress Notes (Signed)
During the night, the patient has complained of increasing right hip/flank pain.  She is now rating as 9/10.  Lidocaine Patch, Voltaren Gel, and Tylenol is not relieving the pain.  She describes as throbbing in nature.  [facility] Paged and made aware.  Orders received for Ultram PO and IV Toradol.  Will continue to monitor patient.  Bernie Covey RN-BC, [employer]

## 2017-03-19 NOTE — Evaluation (Signed)
Physical Therapy Evaluation and Discharge Patient Details Name: Hannah [name] MRN: [id number] DOB: 1940/02/26 Today's Date: 03/19/2017   History of Present Illness  Hannah [name] is a 77 y.o. female with medical history significant for history of DVT and PE on warfarin, type 2 diabetes mellitus, hypertension, and sigmoid stricture status post partial colectomy in 2016, now presenting to the emergency department for evaluation of blood in her colostomy bag.    Clinical Impression  Patient evaluated by Physical Therapy with no further acute PT needs identified. Patient denied rt-side/flank pain at rest or when walking. Indicates it is sore/painful when she pushes along her rt iliac crest (including ASIS). Reproducible with palpation but unclear etiology. Patient does not remember straining to get OOB (strong use of abdominal muscles) and is not sure what caused it. Her mobility is at her baseline, using her cane. PT is signing off. Thank you for this referral.     Follow Up Recommendations No PT follow up    Equipment Recommendations  None recommended by PT    Recommendations for Other Services       Precautions / Restrictions Precautions Precautions: Fall Restrictions Weight Bearing Restrictions: No      Mobility  Bed Mobility               General bed mobility comments: Pt reports she has gotten OOB both to her right and left while hospitalized and does not recall pain starting with getting OOB  (using her abdominal muscles)  Transfers Overall transfer level: Modified independent Equipment used: Straight cane                Ambulation/Gait Ambulation/Gait assistance: Modified independent (Device/Increase time) Ambulation Distance (Feet): 80 Feet Assistive device: Straight cane Gait Pattern/deviations: Step-through pattern;Decreased step length - right;Decreased step length - left   Gait velocity interpretation: <1.8 ft/sec, indicative of risk for recurrent  falls General Gait Details: very slow hesitant appearing; reports she walks this way because she does not want to fall  Stairs            Wheelchair Mobility    Modified Rankin (Stroke Patients Only)       Balance Overall balance assessment: No apparent balance deficits (not formally assessed)(uses cane when goes out, not in her home)                                           Pertinent Vitals/Pain Pain Assessment: No/denies pain(only if she pushes along rt iliac crest)    Home Living Family/patient expects to be discharged to:: Private residence Living Arrangements: Alone Available Help at Discharge: Family;Available PRN/intermittently;Friend(s) Type of Home: House Home Access: Stairs to enter Entrance Stairs-Rails: Right;Left;Can reach both Entrance Stairs-Number of Steps: 4 Home Layout: One level Home Equipment: Cane - single point;Walker - 4 wheels;Bedside commode Additional Comments: has life alert; does not get in shower due to lives alone    Prior Function Level of Independence: Independent with assistive device(s)               Hand Dominance        Extremity/Trunk Assessment   Upper Extremity Assessment Upper Extremity Assessment: Overall WFL for tasks assessed    Lower Extremity Assessment Lower Extremity Assessment: Overall WFL for tasks assessed    Cervical / Trunk Assessment Cervical / Trunk Assessment: Other exceptions Cervical / Trunk Exceptions: no pain in her  right side at rest or while ambulating; pt rubs along her ASIS and iliac crest and reports tenderness; reproducible with palpation   Communication   Communication: No difficulties  Cognition Arousal/Alertness: Awake/alert Behavior During Therapy: WFL for tasks assessed/performed Overall Cognitive Status: Within Functional Limits for tasks assessed                                        General Comments      Exercises     Assessment/Plan     PT Assessment Patent does not need any further PT services  PT Problem List         PT Treatment Interventions      PT Goals (Current goals can be found in the Care Plan section)  Acute Rehab PT Goals Patient Stated Goal: return home; find out what is causing her pain PT Goal Formulation: All assessment and education complete, DC therapy    Frequency     Barriers to discharge        Co-evaluation               AM-PAC PT "6 Clicks" Daily Activity  Outcome Measure Difficulty turning over in bed (including adjusting bedclothes, sheets and blankets)?: None Difficulty moving from lying on back to sitting on the side of the bed? : A Little Difficulty sitting down on and standing up from a chair with arms (e.g., wheelchair, bedside commode, etc,.)?: A Little Help needed moving to and from a bed to chair (including a wheelchair)?: A Little Help needed walking in hospital room?: A Little Help needed climbing 3-5 steps with a railing? : A Little 6 Click Score: 19    End of Session Equipment Utilized During Treatment: Gait belt Activity Tolerance: Patient tolerated treatment well;No increased pain Patient left: in bed;with call bell/phone within reach(as pt was on arrival)   PT Visit Diagnosis: Pain Pain - Right/Left: Right Pain - part of body: (pelvis)    Time: 8657-8469 PT Time Calculation (min) (ACUTE ONLY): 19 min   Charges:   PT Evaluation $PT Eval Low Complexity: 1 Low     PT G Codes:          [employer], PT 03/19/2017, 10:09 AM

## 2017-03-19 NOTE — Progress Notes (Signed)
[facility] PROGRESS NOTE Subjective Patient states that she may still be bleeding because her stool seems to be a darker brown than usual but there is no bright blood.  She is known to have sigmoid diverticulosis.  She bled this time in the face of elevated INR.  She is complained of some vague right-sided abdominal pain had a CT scan which is still pending just returned this afternoon.  Objective: Vital signs in last 24 hours: Temp:  [97.7 F (36.5 C)-98.3 F (36.8 C)] 97.7 F (36.5 C) (03/17 0914) Pulse Rate:  [61-76] 76 (03/17 0914) Resp:  [17-18] 18 (03/17 0914) BP: (138-170)/(61-95) 138/61 (03/17 0914) SpO2:  [100 %] 100 % (03/17 0914) Weight:  [76.5 kg (168 lb 11.2 oz)] 76.5 kg (168 lb 11.2 oz) (03/16 2015) Last BM Date: 03/18/17  Intake/Output from previous day: 03/16 0701 - 03/17 0700 In: 960 [P.O.:960] Out: 950 [Urine:650; Stool:300] Intake/Output this shift: Total I/O In: 480 [P.O.:480] Out: 900 [Urine:825; Stool:75]  PE: General--talkative in no acute distress  Abdomen--nondistended with minimal obvious abdominal tenderness if at all.  The ostomy bag has brown stool with no signs of melena or hematochezia  Lab Results: Recent Labs    03/18/17 0725 03/18/17 1231 03/18/17 2001 03/19/17 0507 03/19/17 1203  HGB 11.7* 12.0 12.7 11.3* 12.6  HCT 37.4 38.5 40.8 36.7 40.3   BMET Recent Labs    03/17/17 0609 03/18/17 0725 03/19/17 0507  NA 141 142 141  K 3.9 3.8 3.8  CL 108 107 107  CO2 25 25 25   CREATININE 0.97 1.03* 1.03*   LFT Recent Labs    03/19/17 0507  PROT 5.3*  AST 17  ALT 11*  ALKPHOS 40  BILITOT 0.7   PT/INR Recent Labs    03/17/17 0609 03/18/17 0725 03/19/17 0507  LABPROT 24.1* 20.7* 20.1*  INR 2.18 1.80 1.73   PANCREAS No results for input(s): LIPASE in the last 72 hours.       Studies/Results: No results found.  Medications: I have reviewed the patient's current medications.  Assessment:   1.  Lower GI  bleed.  Appears to have resolved stool is brown and hemoglobin is actually rising nearly normal.  Was probably diverticular in the face of elevated INR.  Do not feel that she needs any further diagnostic testing.   Plan: 1.  Should be okay to discharge.  Slowly start her back on anticoagulation.  Would probably benefit from continuation of MiraLAX. 2.  Would have her see Dr. Dulce Sellar back in the office in approximately 3 weeks.  If the bleeding returns or continues she may need a colonoscopy.  We will sign off please call us for any further problems   Tresea Mall 03/19/2017, 4:50 PM  This note was created using voice recognition software. Minor errors may Have occurred unintentionally.  Pager: [phone] If no answer or after hours call [phone]

## 2017-03-19 NOTE — Discharge Summary (Signed)
Physician Discharge Summary  Hannah [name] ZOX:[id number] DOB: [birth year] DOA: 03/14/2017  PCP: [name], DO  Admit date: 03/14/2017 Discharge date: 03/19/2017  Time spent: over 30 minutes  Recommendations for Outpatient Follow-up:  1. Follow up outpatient INR and warfarin dosing with coumadin clinic (INR goal appears to be 2.5-3.5 in setting of recurrent VTE on warfarin, but I was unable to see coumadin clinic's notes and confirm this is still the current goal.  On review of previous notes in epic I see both 2-3 and 2.5-3.5 in pharmacy notes.  Would clarify INR goal at coumadin clinic and follow up warfarin dosing with recently elevated INR)   2. Ensure follow up with GI in about 3 weeks, may need colonoscopy if bleeding is recurrent 3. Follow up outpatient CBC/CMP 4. Follow up musculoskeletal pain of R side as needed  Discharge Diagnoses:  Principal Problem:   GI bleed Active Problems:   Hypothyroid   Essential hypertension   Diabetes mellitus with neurological manifestations, controlled (HCC)   History of pulmonary embolism   Chronic diastolic CHF (congestive heart failure) (HCC)   Acute GI bleeding   Discharge Condition: stable  Diet recommendation: heart healthy  Filed Weights   03/16/17 2051 03/17/17 2024 03/18/17 2015  Weight: 76.6 kg (168 lb 12.8 oz) 76.6 kg (168 lb 14 oz) 76.5 kg (168 lb 11.2 oz)    History of present illness:  Per HPI  Hannah [name] 77 y.o.femalewith medical history significant forhistory of DVT and PE on warfarin, type 2 diabetes mellitus, hypertension, and sigmoid stricture status post partial colectomy in 2016, now presenting to the emergency department for evaluation of blood in her colostomy bag. Patient reports that she been in her usual state of health until developing pain in the right flank yesterday and then noting blood in her colostomy bag. She reports mild nausea without vomiting, denies fevers or chills. She denies chest pain,  palpitations, or lightheadedness. She has had similar pain in the right flank previously and reports that it is worse with certain movements. She underwent colonoscopy 1 year ago and was found to have sigmoid and descending diverticula. She was evaluated at urgent care for these complaints yesterday, noted to have INR of 5.3, and discharged home. She has since been to her Coumadin clinic where she was instructed to hold her warfarin for 2 days. Bleeding persisted and she presents to the ED.   GI was consulted.  Her warfarin was held and her INR gradually downtrended.  Her hemoglobin remained relatively stable throughout her hospitalization.  She was discharged with Suresh Audi stable hemoglobin and when her bleeding through her ostomy output improved.  She had R sided pain throughout the hospitalization and complained of this worsening on the night prior to d/c.  CT abdomen/pelvis was obtained which was unremarkable for Maddie Brazier cause.  Suspect musculoskeletal.  She was discharged on previous dose of warfarin without bridge given recent bleeding.   Hospital Course:   1.Acute GI bleeding -Blood in ostomy improving today.   -Has hx of partial colectomy and diverticula were noted in sigmoid and descending colon on colonoscopy one year ago -Plan to hold warfarin, follow H&H q8h, start Protonix BID though lower GIB seems most likely - GI c/s, tagged RBC study without evidence of bleeding. Plan to discharge today with brown stool in ostomy and stable H/H.  GI recommending slowly restarting anticoagulation, will send on previous dose of warfarin without bridge.   2.History of DVT and PE -No evidence for  acute VTE -Has IVC filter and managed with warfarin -INR is 3.47 on admission(subtherapeutic today) -She has remained hemodynamically stable, Hgb close to baseline - R>L LEE (notable for chronic thrombus - see report below) - resume previous warfarin dose  3.Chronic diastolic CHF -Appears  well-compensated -resume lasix at discharge -Follow daily wt and I/O's  4.Type II DM -A1c was 6.4% earlier this month -Managed at home with metformin, held on admission -Follow CBG and use Novolog correctional as needed  5.Hypothyroidism -synthroid  6.Hypertension -resume lisinopril and metoprolol.  Resume lasix.   # RUQ Pain:  Persistent pain, noted in RUQ today, will f/u CT Abdomen pelvis (without acute abnormality - evidence of diverticulosis, stable subcentimeter renal cyst).  Suspect MSK as improved with lidocaine and voltaren and seemed to be pain on lower ribs. Continue to watch at discharge.   Procedures: LE [place] Final Interpretation: Right: Abnormalities consistent with the sequela of Meagen Limones prior venous obstructive process, with findings that appear chronic/long standing in nature are identified in the Femoral vein, and Popliteal vein. Small amount of residual thrombus in right lower  extremity. Left: There is no evidence of deep vein thrombosis in the lower extremity. No cystic structure found in the popliteal fossa.  Consultations:  GI  Discharge Exam: Vitals:   03/19/17 0914 03/19/17 1732  BP: 138/61 (!) 148/76  Pulse: 76 67  Resp: 18 18  Temp: 97.7 F (36.5 C) 97.7 F (36.5 C)  SpO2: 100% 100%    Discharge Instructions   Discharge Instructions    Call MD for:  difficulty breathing, headache or visual disturbances   Complete by:  As directed    Call MD for:  extreme fatigue   Complete by:  As directed    Call MD for:  persistant dizziness or light-headedness   Complete by:  As directed    Call MD for:  persistant nausea and vomiting   Complete by:  As directed    Call MD for:  severe uncontrolled pain   Complete by:  As directed    Call MD for:  temperature >100.4   Complete by:  As directed    Diet - low sodium heart healthy   Complete by:  As directed    Diet - low sodium heart healthy   Complete by:  As directed    Diet - low  sodium heart healthy   Complete by:  As directed    Discharge instructions   Complete by:  As directed    You were seen for Eitan Doubleday likely diverticular bleed.  Your bleeding slowed down when we held your warfarin.  We will restart this at your previous dose, but it's very important that you follow up with the coumadin clinic so they can make changes if needed (your most recent INR's were Khristi Schiller bit high so they dose may need to be adjusted).  Your pain in your side is likely musculoskeletal.  I spoke to the radiologist about Medora Roorda preliminary read, but the final read is still pending.  Continue tylenol, lidocaine patch, and voltaren gel as needed.  Please follow up with your PCP within Karlen Barbar few days.  Follow up with the coumadin clinic for warfarin dosing.  Follow up with gastroenterology as needed.  Return if you have new, recurrent, or worsening symptoms.  Please ask your PCP to request records for this hospitalization so they know what was done and what the next steps are.   Increase activity slowly   Complete by:  As  directed    Increase activity slowly   Complete by:  As directed    Increase activity slowly   Complete by:  As directed      Allergies as of 03/19/2017      Reactions   Augmentin [amoxicillin-pot Clavulanate] Other (See Comments)   Severe vaginal itching   Penicillins    Tranxene [clorazepate] Itching      Medication List    TAKE these medications   acetaminophen 500 MG tablet Commonly known as:  TYLENOL Take 500 mg by mouth every 6 (six) hours as needed (pain).   diclofenac sodium 1 % Gel Commonly known as:  VOLTAREN Apply 2 g topically 4 (four) times daily.   furosemide 20 MG tablet Commonly known as:  LASIX Take 1 tablet (20 mg total) by mouth daily.   levothyroxine 25 MCG tablet Commonly known as:  SYNTHROID, LEVOTHROID Take 1 tablet by mouth  daily before breakfast What changed:    how much to take  how to take this  when to take this  additional  instructions   lidocaine 5 % Commonly known as:  LIDODERM Place 1 patch onto the skin daily. Remove & Discard patch within 12 hours or as directed by MD   lisinopril 40 MG tablet Commonly known as:  PRINIVIL,ZESTRIL Take 1 tablet (40 mg total) by mouth daily.   metFORMIN 1000 MG tablet Commonly known as:  GLUCOPHAGE TAKE ONE TABLET BY MOUTH WITH BREAKFAST AND TAKE ONE TABLET WITH DINNER   metoprolol tartrate 25 MG tablet Commonly known as:  LOPRESSOR Take 1 tablet (25 mg total) by mouth 2 (two) times daily.   ONETOUCH DELICA LANCETS FINE Misc USE ONCE DAILY   polyethylene glycol packet Commonly known as:  MIRALAX / GLYCOLAX Take 17 g by mouth daily.   potassium chloride 10 MEQ tablet Commonly known as:  K-DUR Take as directed--Take one tablet with each furosemide (Lasix) dose What changed:    how much to take  how to take this  when to take this  additional instructions   simvastatin 20 MG tablet Commonly known as:  ZOCOR Take 1 tablet (20 mg total) by mouth at bedtime.   triamcinolone cream 0.1 % Commonly known as:  KENALOG Apply 1 application topically 2 (two) times daily.   vitamin C 500 MG tablet Commonly known as:  ASCORBIC ACID Take 500 mg by mouth daily.   warfarin 5 MG tablet Commonly known as:  COUMADIN Take as directed. If you are unsure how to take this medication, talk to your nurse or doctor. Original instructions:  Take as directed by Coumadin Clinic What changed:    how much to take  how to take this  when to take this  additional instructions      Allergies  Allergen Reactions  . Augmentin [Amoxicillin-Pot Clavulanate] Other (See Comments)    Severe vaginal itching  . Penicillins   . Tranxene [Clorazepate] Itching   Follow-up Information    [name], DO. Call.   Specialty:  Family Medicine Contact information: [address] [place] [address] [phone]        Willis Modena, MD Follow up.    Specialty:  Gastroenterology Contact information: [address] [place] [address] [phone]        [facility] Follow up.   Specialty:  Radiology Contact information: [address] [id number] mc [place] [address] [phone]  The results of significant diagnostics from this hospitalization (including imaging, microbiology, ancillary and laboratory) are listed below for reference.    Significant Diagnostic Studies: Dg Chest 2 View  Result Date: 03/14/2017 CLINICAL DATA:  Acute right rib pain. EXAM: CHEST - 2 VIEW COMPARISON:  Radiograph of October 12, 2014. FINDINGS: The heart size and mediastinal contours are within normal limits. Both lungs are clear. No pneumothorax or pleural effusion is noted. Atherosclerosis of thoracic aorta is noted. The visualized skeletal structures are unremarkable. IMPRESSION: No active cardiopulmonary disease. Aortic Atherosclerosis (ICD10-I70.0). Electronically Signed   By: Lupita Raider, M.D.   On: 03/14/2017 11:45   Nm Gi Blood Loss  Result Date: 03/17/2017 CLINICAL DATA:  GI bleed. History of partial colectomy with left lower quadrant colostomy. Blood in colostomy bag. EXAM: NUCLEAR MEDICINE GASTROINTESTINAL BLEEDING SCAN TECHNIQUE: Sequential abdominal images were obtained following intravenous administration of Tc-66m labeled red blood cells. RADIOPHARMACEUTICALS:  26.7 mCi Tc-64m pertechnetate in-vitro labeled red cells. COMPARISON:  None. FINDINGS: Imaging was performed over 2 hours. Normal blood pool activity is seen. There is at most subtle activity in the left mid to lower abdomen at the beginning of the scan which does not increase over time or clearly correspond to bowel. There is no evidence of active GI bleeding elsewhere. IMPRESSION: No active GI bleed identified. Electronically Signed   By: Sebastian Ache M.D.   On: 03/17/2017 15:51   Ct Abdomen Pelvis  W Contrast  Result Date: 03/19/2017 CLINICAL DATA:  Acute abdominal pain more so right upper quadrant. EXAM: CT ABDOMEN AND PELVIS WITH CONTRAST TECHNIQUE: Multidetector CT imaging of the abdomen and pelvis was performed using the standard protocol following bolus administration of intravenous contrast. CONTRAST:  ISOVUE-300 IOPAMIDOL (ISOVUE-300) INJECTION 61% COMPARISON:  10/12/2014 and 07/15/2012 CT abdomen and pelvis FINDINGS: Lower chest: Normal heart size. Clear lung bases with minimal atelectasis. Stable right lower lobe 6 mm subpleural nodular opacity with adjacent streaky parenchymal opacity dating back 2014 most likely representing Leyton Brownlee focus of scarring. Hepatobiliary: No space-occupying mass. Normal gallbladder without stones or wall thickening. No biliary dilatation. Pancreas: No ductal dilatation, mass or inflammation. Spleen: Normal without mass. Adrenals/Urinary Tract: Stable mild thickening of the left adrenal gland without focal mass. Symmetric cortical enhancement of both kidneys without obstructive uropathy, enhancing mass but with Christyn Gutkowski small cyst in the upper pole the right kidney measuring 9 mm. Urinary bladder is physiologically distended. Stomach/Bowel: Nondistended stomach with normal small bowel rotation. No bowel obstruction or inflammation. Unremarkable distal and terminal ileum. Normal-appearing appendix. Left lower quadrant colostomy is noted status post partial left colectomy. There is left-sided diverticulosis within the segment of bowel in the colostomy. Vascular/Lymphatic: IVC filter noted in appropriate position. Minimal aortoiliac atherosclerosis without aneurysm or dissection. Reproductive: Hysterectomy.  No adnexal mass. Other: No free air nor free fluid. Musculoskeletal: Left total hip arthroplasty is identified without complications. Minimal degenerative grade 1 anterolisthesis of L4 on L5. Moderate marked degenerative disc disease L5-S1 with vacuum disc phenomena and disc  flattening. There is multilevel lumbar degenerative facet arthropathy. No aggressive osseous lesions. IMPRESSION: 1. No acute solid nor hollow visceral organ abnormality. 2. Status post left partial colectomy. Minimal left-sided diverticulosis without acute diverticulitis. 3. Stable small subcentimeter cyst in the upper pole the right kidney. Electronically Signed   By: Tollie Eth M.D.   On: 03/19/2017 18:20    Microbiology: Recent Results (from the past 240 hour(s))  Urine culture     Status: Abnormal   Collection  Time: 03/14/17 12:55 PM  Result Value Ref Range Status   Specimen Description URINE, RANDOM  Final   Special Requests NONE  Final   Culture (Riely Baskett)  Final    <10,000 COLONIES/mL INSIGNIFICANT GROWTH Performed at [facility], [address], [place], [place] [address]    Report Status 03/15/2017 FINAL  Final     Labs: Basic Metabolic Panel: Recent Labs  Lab 03/15/17 0352 03/16/17 0203 03/17/17 0609 03/18/17 0725 03/19/17 0507  NA 141 141 141 142 141  K 4.0 4.0 3.9 3.8 3.8  CL 108 109 108 107 107  CO2 23 25 25 25 25   GLUCOSE 96 100* 109* 101* 100*  BUN 14 13 10 10 8   CREATININE 0.91 0.90 0.97 1.03* 1.03*  CALCIUM 9.4 8.7* 8.9 9.0 8.9  MG  --   --   --   --  1.7   Liver Function Tests: Recent Labs  Lab 03/14/17 1926 03/16/17 0203 03/19/17 0507  AST 23 16 17   ALT 11* 10* 11*  ALKPHOS 53 40 40  BILITOT 0.6 0.8 0.7  PROT 6.9 5.5* 5.3*  ALBUMIN 3.5 2.8* 2.8*   No results for input(s): LIPASE, AMYLASE in the last 168 hours. No results for input(s): AMMONIA in the last 168 hours. CBC: Recent Labs  Lab 03/14/17 1926  03/18/17 0725 03/18/17 1231 03/18/17 2001 03/19/17 0507 03/19/17 1203  WBC 11.2*  --   --   --   --   --   --   NEUTROABS 7.1  --   --   --   --   --   --   HGB 12.9   < > 11.7* 12.0 12.7 11.3* 12.6  HCT 40.8   < > 37.4 38.5 40.8 36.7 40.3  MCV 89.3  --   --   --   --   --   --   PLT 301  --   --   --   --   --   --    < > = values  in this interval not displayed.   Cardiac Enzymes: No results for input(s): CKTOTAL, CKMB, CKMBINDEX, TROPONINI in the last 168 hours. BNP: BNP (last 3 results) No results for input(s): BNP in the last 8760 hours.  ProBNP (last 3 results) No results for input(s): PROBNP in the last 8760 hours.  CBG: Recent Labs  Lab 03/19/17 0019 03/19/17 0430 03/19/17 0736 03/19/17 1140 03/19/17 1627  GLUCAP 120* 98 96 97 97       Signed:  Lacretia Nicks MD.  [facility] 03/19/2017, 8:19 PM

## 2017-03-19 NOTE — Progress Notes (Addendum)
ANTICOAGULATION CONSULT NOTE  Pharmacy Consult for coumadin Indication: DVT  Allergies  Allergen Reactions  . Augmentin [Amoxicillin-Pot Clavulanate] Other (See Comments)    Severe vaginal itching  . Penicillins   . Tranxene [Clorazepate] Itching    Patient Measurements: Height: 5\' 7"  (170.2 cm) Weight: 168 lb 11.2 oz (76.5 kg) IBW/kg (Calculated) : 61.6  Vital Signs: Temp: 98 F (36.7 C) (03/17 0433) Temp Source: Oral (03/17 0433) BP: 151/77 (03/17 0433) Pulse Rate: 71 (03/17 0433)  Labs: Recent Labs    03/17/17 0609  03/18/17 0725 03/18/17 1231 03/18/17 2001 03/19/17 0507  HGB 10.8*   < > 11.7* 12.0 12.7 11.3*  HCT 35.1*   < > 37.4 38.5 40.8 36.7  LABPROT 24.1*  --  20.7*  --   --  20.1*  INR 2.18  --  1.80  --   --  1.73  CREATININE 0.97  --  1.03*  --   --  1.03*   < > = values in this interval not displayed.    Estimated Creatinine Clearance: Hannah.4 mL/min (A) (by C-G formula based on SCr of 1.03 mg/dL (H)).   Medical History: Past Medical History:  Diagnosis Date  . Acute massive pulmonary embolism (HCC) 07/13/2012   Massive PE w/ PEA arrest 07/13/12 >TNK >IVC filter >discharged on comadin    . Diabetes mellitus, type 2 (HCC) 07/15/2012   with peripheral neuropathy  . Hyperlipemia 07/14/2012  . Hypertension 07/14/2012  . Large bowel stricture (HCC)    s/p colectomy in 2016  . Osteoarthritis    s/p hip and knee replacements  . Thyroid disease     Assessment: Hannah [name] with hx of DVT/PE on coumadin PTA, admitted with blood in colostomy bag. Coumadin has been on hold since 3/12 d/t supratherapeutic INR. Hgb decreased overnight 12.7>11.3 but no bleeding noted currently. INR subtherapeutic again today, 1.73 but restarted home coumadin regimen yesterday. DDI noted with levothyroxine.  PTA dose: 2.5 mg on TTS, 5mg  on AOD  Goal of Therapy:  INR 2.5-3.5 (per coumadin clinic note) Monitor platelets by anticoagulation protocol: Yes   Plan:  Coumadin 5 mg PO x  1 Daily INR, CBCs F/u s/sx bleeding, PO intake, DDI   Donnella Bi, PharmD PGY1 Acute Care Pharmacy Resident Pager: [phone]  03/19/2017,8:17 AM

## 2017-03-20 ENCOUNTER — Telehealth: Payer: Self-pay | Admitting: Family Medicine

## 2017-03-20 NOTE — Telephone Encounter (Signed)
Transition Care Management Follow-up Telephone Call  Hannah [name] PXT:[id number] DOB: [birth year] DOA: 03/14/2017  PCP: [name], DO  Admit date: 03/14/2017 Discharge date: 03/19/2017  Time spent: over 30 minutes  Recommendations for Outpatient Follow-up:  1. Follow up outpatient INR and warfarin dosing with coumadin clinic (INR goal appears to be 2.5-3.5 in setting of recurrent VTE on warfarin, but I was unable to see coumadin clinic's notes and confirm this is still the current goal.  On review of previous notes in epic I see both 2-3 and 2.5-3.5 in pharmacy notes.  Would clarify INR goal at coumadin clinic and follow up warfarin dosing with recently elevated INR)   2. Ensure follow up with GI in about 3 weeks, may need colonoscopy if bleeding is recurrent 3. Follow up outpatient CBC/CMP 4. Follow up musculoskeletal pain of R side as needed  Discharge Diagnoses:  Principal Problem:   GI bleed Active Problems:   Hypothyroid   Essential hypertension   Diabetes mellitus with neurological manifestations, controlled (HCC)   History of pulmonary embolism   Chronic diastolic CHF (congestive heart failure) (HCC)   Acute GI bleeding   Discharge Condition: stable    How have you been since you were released from the hospital? "better"   Do you understand why you were in the hospital? yes   Do you understand the discharge instructions? yes   Where were you discharged to? Home   Items Reviewed:  Medications reviewed: yes  Allergies reviewed: yes  Dietary changes reviewed: yes  Referrals reviewed: yes   Functional Questionnaire:   Activities of Daily Living (ADLs):   She states they are independent in the following: ambulation, bathing and hygiene, feeding, continence, grooming, toileting and dressing States they require assistance with the following: none   Any transportation issues/concerns?: no   Any patient concerns? no   Confirmed importance and  date/time of follow-up visits scheduled yes Provider Appointment booked with Dr. [name] on 03/21/2017 Tuesday at 3:15 pm Confirmed with patient if condition begins to worsen call PCP or go to the ER.  Patient was given the office number and encouraged to call back with question or concerns.  : yes

## 2017-03-21 ENCOUNTER — Encounter: Payer: Self-pay | Admitting: Family Medicine

## 2017-03-21 ENCOUNTER — Ambulatory Visit (INDEPENDENT_AMBULATORY_CARE_PROVIDER_SITE_OTHER): Payer: Medicare Other | Admitting: Family Medicine

## 2017-03-21 VITALS — BP 138/72 | HR 83 | Temp 98.1°F | Ht 67.0 in | Wt 163.5 lb

## 2017-03-21 DIAGNOSIS — D62 Acute posthemorrhagic anemia: Secondary | ICD-10-CM

## 2017-03-21 DIAGNOSIS — E038 Other specified hypothyroidism: Secondary | ICD-10-CM

## 2017-03-21 DIAGNOSIS — K922 Gastrointestinal hemorrhage, unspecified: Secondary | ICD-10-CM

## 2017-03-21 DIAGNOSIS — E118 Type 2 diabetes mellitus with unspecified complications: Secondary | ICD-10-CM

## 2017-03-21 DIAGNOSIS — Z86711 Personal history of pulmonary embolism: Secondary | ICD-10-CM

## 2017-03-21 DIAGNOSIS — R799 Abnormal finding of blood chemistry, unspecified: Secondary | ICD-10-CM | POA: Diagnosis not present

## 2017-03-21 DIAGNOSIS — R7989 Other specified abnormal findings of blood chemistry: Secondary | ICD-10-CM

## 2017-03-21 DIAGNOSIS — R944 Abnormal results of kidney function studies: Secondary | ICD-10-CM | POA: Diagnosis not present

## 2017-03-21 DIAGNOSIS — I10 Essential (primary) hypertension: Secondary | ICD-10-CM | POA: Diagnosis not present

## 2017-03-21 NOTE — Progress Notes (Signed)
HPI:  Using dictation device. Unfortunately this device frequently misinterprets words/phrases.  Hannah [name] is a pleasant 77 y.o. with a PMH significant for colectomy/colostomy, PE on chronic OAC (sees cardiology), DM, HTN, HLD, hypothyroidism here for a hospital follow up. See transitional care phone note in Epic. Per review of discharge documents and patient: Hospitalized 03/14/2017 to 03/19/2017 Primary admitting complaint(s): Bleeding from bowels and colostomy bag Primary admitting diagnosis (es) and treatment: GI bleed, supratherapeutic INR -warfarin was held initially, GI consulted, tagged RBC study did not show bleeding, per notes H/H stable and GI recommended slowly restarting anticoagulation without a bridge -she is to follow-up with Coumadin clinic and GI if any concerns (Dr. Dulce Sellar); INR on discharge was 1.73, hemoglobin was 11.3, mild bump in creatinine, glucose was good on discharge, TSH looks good in March Other significant diagnosis (es) and treatment: Right-sided pain, CT abdomen pelvis was negative, per discharge notes this is suspected to be musculoskeletal; metformin was held in the hospital Follow up concerns per discharge document: Reports today: She is feeling well, denies any pain inside her abdomen today, denies any bleeding today, reports she is back on her metformin and her medications and is seeing the Coumadin clinic tomorrow.  Plans to follow-up with her gastroenterologist as well, as this was very scary for her.  She denies the need for any help with setting up this appointment.  She reports she saw a small amount of blood in her colostomy bag yesterday, but none today. Her daughter Johny Shock is here with her today and is requesting FMLA to take her to doctor's appointments moving forward.  She anticipates needing to take her to the doctor about 1-3 times per month and her work has requested FMLA for this.  Denies: Bleeding today, chest pain, any significant  amount of bleeding since discharge, abdominal or back or flank pain, shortness of breath or swelling.   ROS: See pertinent positives and negatives per HPI.  Past Medical History:  Diagnosis Date  . Acute massive pulmonary embolism (HCC) 07/13/2012   Massive PE w/ PEA arrest 07/13/12 >TNK >IVC filter >discharged on comadin    . Diabetes mellitus, type 2 (HCC) 07/15/2012   with peripheral neuropathy  . Hyperlipemia 07/14/2012  . Hypertension 07/14/2012  . Large bowel stricture (HCC)    s/p colectomy in 2016  . Osteoarthritis    s/p hip and knee replacements  . Thyroid disease     Past Surgical History:  Procedure Laterality Date  . COLONOSCOPY N/A 03/12/2016   Procedure: COLONOSCOPY;  Surgeon: Dorena Cookey, MD;  Location: [facility] ENDOSCOPY;  Service: Endoscopy;  Laterality: N/A;  . COLOSTOMY N/A 06/03/2014   Procedure: COLOSTOMY;  Surgeon: Harriette Bouillon, MD;  Location: [facility] OR;  Service: General;  Laterality: N/A;  . FLEXIBLE SIGMOIDOSCOPY N/A 05/30/2014   Procedure: Arnell Sieving;  Surgeon: Jeani Hawking, MD;  Location: [facility] ENDOSCOPY;  Service: Endoscopy;  Laterality: N/A;  . INSERTION OF VENA CAVA FILTER N/A 07/16/2012   Procedure: INSERTION OF VENA CAVA FILTER;  Surgeon: [name], MD;  Location: [facility] CATH LAB;  Service: Cardiovascular;  Laterality: N/A;  . KNEE ARTHROSCOPY    . PARTIAL COLECTOMY N/A 06/03/2014   Procedure: PARTIAL COLECTOMY;  Surgeon: Harriette Bouillon, MD;  Location: [facility] OR;  Service: General;  Laterality: N/A;  . REDUCTION MAMMAPLASTY Bilateral   . SP ARTHRO HIP*L*      Family History  Problem Relation Age of Onset  . Breast cancer Mother     SOCIAL  HX: see hpi, no reported change   Current Outpatient Medications:  .  acetaminophen (TYLENOL) 500 MG tablet, Take 500 mg by mouth every 6 (six) hours as needed (pain)., Disp: , Rfl:  .  diclofenac sodium (VOLTAREN) 1 % GEL, Apply 2 g topically 4 (four) times daily., Disp: 100 g, Rfl: 0 .  furosemide (LASIX) 20 MG  tablet, Take 1 tablet (20 mg total) by mouth daily., Disp: 30 tablet, Rfl: 3 .  levothyroxine (SYNTHROID, LEVOTHROID) 25 MCG tablet, Take 1 tablet by mouth  daily before breakfast (Patient taking differently: Take 25 mcg by mouth daily before breakfast. ), Disp: 90 tablet, Rfl: 1 .  lidocaine (LIDODERM) 5 %, Place 1 patch onto the skin daily. Remove & Discard patch within 12 hours or as directed by MD, Disp: 30 patch, Rfl: 0 .  lisinopril (PRINIVIL,ZESTRIL) 40 MG tablet, Take 1 tablet (40 mg total) by mouth daily., Disp: 90 tablet, Rfl: 1 .  metFORMIN (GLUCOPHAGE) 1000 MG tablet, TAKE ONE TABLET BY MOUTH WITH BREAKFAST AND TAKE ONE TABLET WITH DINNER, Disp: 180 tablet, Rfl: 1 .  metoprolol tartrate (LOPRESSOR) 25 MG tablet, Take 1 tablet (25 mg total) by mouth 2 (two) times daily., Disp: 180 tablet, Rfl: 1 .  ONETOUCH DELICA LANCETS FINE MISC, USE ONCE DAILY, Disp: 100 each, Rfl: 3 .  polyethylene glycol (MIRALAX / GLYCOLAX) packet, Take 17 g by mouth daily., Disp: 14 each, Rfl: 0 .  potassium chloride (K-DUR) 10 MEQ tablet, Take as directed--Take one tablet with each furosemide (Lasix) dose (Patient taking differently: Take 10 mEq by mouth See admin instructions. Take as directed--Take one tablet with each furosemide (Lasix) dose), Disp: 30 tablet, Rfl: 6 .  simvastatin (ZOCOR) 20 MG tablet, Take 1 tablet (20 mg total) by mouth at bedtime., Disp: 90 tablet, Rfl: 3 .  triamcinolone cream (KENALOG) 0.1 %, Apply 1 application topically 2 (two) times daily., Disp: 30 g, Rfl: 0 .  vitamin C (ASCORBIC ACID) 500 MG tablet, Take 500 mg by mouth daily. , Disp: , Rfl:  .  warfarin (COUMADIN) 5 MG tablet, Take as directed by Coumadin Clinic (Patient taking differently: Take 2.5-5 mg by mouth See admin instructions. Take 1 tablet on Monday, Wednesday and Friday then take 1/2 tablet all the other days), Disp: 90 tablet, Rfl: 1  EXAM:  Vitals:   03/21/17 1504  BP: 138/72  Pulse: 83  Temp: 98.1 F (36.7 C)     Body mass index is 25.61 kg/m.  GENERAL: vitals reviewed and listed above, alert, oriented, appears well hydrated and in no acute distress  HEENT: atraumatic, conjunttiva clear, no obvious abnormalities on inspection of external nose and ears  NECK: no obvious masses on inspection  LUNGS: clear to auscultation bilaterally, no wheezes, rales or rhonchi, good air movement  CV: HRRR, no peripheral edema  Abdomen: Soft and nontender  MS: moves all extremities without noticeable abnormality  PSYCH: pleasant and cooperative, no obvious depression or anxiety  ASSESSMENT AND PLAN:  Discussed the following assessment and plan:  Acute GI bleeding  Acute blood loss anemia  Abnormal serum creatinine level  Diabetes mellitus with complication (HCC)  Essential hypertension  Other specified hypothyroidism  History of pulmonary embolism  -discharge recommendation, medications reviewed w/ pt and daughter -CBC and BMP today to follow-up on stability of labs from discharge -Advised she notify her gastroenterologist in Coumadin clinic immediately if any further bleeding and to seek immediate emergency care if any significant amount of blood in  her bag or persistent or recurrent bleeding -She has a Coumadin clinic visit tomorrow -She plans to  follow-up with her gastroenterologist as well and declined any need for help with this appointment -We will complete FMLA for the history of GI bleeding, history of pulmonary embolism on oral anticoagulation, diabetes, hypertension, hypothyroidism for her daughter to take her to appointments 1-3 times per month  -Patient advised to return or notify a doctor immediately if symptoms worsen or persist or new concerns arise.  FMLA forms completed after visit and given to Ronnald Collum to copy/scan and fax or return to daughter per her preference.  Patient Instructions  BEFORE YOU LEAVE: -labs -follow up: 3 - 4 months  See the coumadin clinic as  planned.  See your gastroenterologist as planned.  Call you gastroenterologist and coumadin clinic if any bleeding or concerns.  Seek immediate care if any significant recurrent bleeding.  We have ordered labs or studies at this visit. It can take up to 1-2 weeks for results and processing. IF results require follow up or explanation, we will call you with instructions. Clinically stable results will be released to your [facility]. If you have not heard from [place] or cannot find your results in [facility] in 2 weeks please contact our office at [phone].  If you are not yet signed up for [facility], please consider signing up.           [name], DO

## 2017-03-21 NOTE — Patient Instructions (Signed)
BEFORE YOU LEAVE: -labs -follow up: 3 - 4 months  See the coumadin clinic as planned.  See your gastroenterologist as planned.  Call you gastroenterologist and coumadin clinic if any bleeding or concerns.  Seek immediate care if any significant recurrent bleeding.  We have ordered labs or studies at this visit. It can take up to 1-2 weeks for results and processing. IF results require follow up or explanation, we will call you with instructions. Clinically stable results will be released to your [facility]. If you have not heard from [place] or cannot find your results in [facility] in 2 weeks please contact our office at [phone].  If you are not yet signed up for [facility], please consider signing up.

## 2017-03-22 DIAGNOSIS — H2513 Age-related nuclear cataract, bilateral: Secondary | ICD-10-CM | POA: Diagnosis not present

## 2017-03-22 DIAGNOSIS — E119 Type 2 diabetes mellitus without complications: Secondary | ICD-10-CM | POA: Diagnosis not present

## 2017-03-22 DIAGNOSIS — H524 Presbyopia: Secondary | ICD-10-CM | POA: Diagnosis not present

## 2017-03-22 LAB — BASIC METABOLIC PANEL
BUN: 19 mg/dL (ref 6–23)
CHLORIDE: 103 meq/L (ref 96–112)
CO2: 24 mEq/L (ref 19–32)
Calcium: 10.1 mg/dL (ref 8.4–10.5)
Creatinine, Ser: 1.33 mg/dL — ABNORMAL HIGH (ref 0.40–1.20)
GFR: 49.9 mL/min — AB (ref >60.00)
GLUCOSE: 116 mg/dL — AB (ref 70–99)
POTASSIUM: 3.9 meq/L (ref 3.5–5.1)
SODIUM: 143 meq/L (ref 135–145)

## 2017-03-22 LAB — CBC
HEMATOCRIT: 41.7 % (ref 36.0–46.0)
HEMOGLOBIN: 13.2 g/dL (ref 12.0–15.0)
MCHC: 31.7 g/dL (ref 30.0–36.0)
MCV: 89.7 fl (ref 78.0–100.0)
Platelets: 285 10*3/uL (ref 150.0–400.0)
RBC: 4.65 Mil/uL (ref 3.87–5.11)
RDW: 16.2 % — AB (ref 11.5–15.5)
WBC: 8.8 10*3/uL (ref 4.0–10.5)

## 2017-03-23 ENCOUNTER — Ambulatory Visit: Payer: Medicare Other | Admitting: *Deleted

## 2017-03-23 ENCOUNTER — Other Ambulatory Visit: Payer: Self-pay | Admitting: Family Medicine

## 2017-03-23 DIAGNOSIS — Z7901 Long term (current) use of anticoagulants: Secondary | ICD-10-CM | POA: Diagnosis not present

## 2017-03-23 DIAGNOSIS — I2699 Other pulmonary embolism without acute cor pulmonale: Secondary | ICD-10-CM | POA: Diagnosis not present

## 2017-03-23 DIAGNOSIS — Z5181 Encounter for therapeutic drug level monitoring: Secondary | ICD-10-CM | POA: Diagnosis not present

## 2017-03-23 LAB — POCT INR: INR: 3.3

## 2017-03-23 NOTE — Addendum Note (Signed)
Addended by: Johnella Moloney on: 03/23/2017 11:57 AM   Modules accepted: Orders

## 2017-03-23 NOTE — Patient Instructions (Signed)
Description   Start  taking 1/2 tablet daily except 1 tablet on Sundays and Wednesdays.   Recheck in 1 week.  Call with any new meds or procedures [phone]

## 2017-03-27 ENCOUNTER — Other Ambulatory Visit: Payer: Self-pay

## 2017-03-27 NOTE — Patient Outreach (Signed)
[facility]) Care Management  03/27/2017  Kewanna Braden [birth year] [id number]   EMMI- General Discharge RED ON EMMI ALERT Day # 4 Date: 03/24/17 Red Alert Reason:  Scheduled follow up? No Taking meds? No  Telephone call to patient for EMMI red alert.  Patient able to verify HIPAA.  Patient reports she is doing good.  Patient denies any problems presently. Addressed red alerts with patient.  Patient states she has an appointment with her primary care doctor on 04-10-17 and an appointment with Dr. Dulce Sellar on 04/13/17.  Patient states that her daughter and son-in-law will be taking her to her appointments.   Patient states she has all her medications and that her daughter will be picking up medications for her today.  Patient denies any concerns at this time.    Plan: RN CM will close case and notify care management assistant of case status.  Bary Leriche, RN, MSN [facility] Care Management Care Management Coordinator Direct Line [phone] Toll Free: [phone]  Fax: [phone]

## 2017-03-30 ENCOUNTER — Ambulatory Visit (INDEPENDENT_AMBULATORY_CARE_PROVIDER_SITE_OTHER): Payer: Medicare Other | Admitting: *Deleted

## 2017-03-30 DIAGNOSIS — I2699 Other pulmonary embolism without acute cor pulmonale: Secondary | ICD-10-CM | POA: Diagnosis not present

## 2017-03-30 DIAGNOSIS — Z5181 Encounter for therapeutic drug level monitoring: Secondary | ICD-10-CM | POA: Diagnosis not present

## 2017-03-30 DIAGNOSIS — Z7901 Long term (current) use of anticoagulants: Secondary | ICD-10-CM | POA: Diagnosis not present

## 2017-03-30 DIAGNOSIS — Z86718 Personal history of other venous thrombosis and embolism: Secondary | ICD-10-CM | POA: Diagnosis not present

## 2017-03-30 DIAGNOSIS — Z86711 Personal history of pulmonary embolism: Secondary | ICD-10-CM | POA: Diagnosis not present

## 2017-03-30 LAB — POCT INR: INR: 2.7

## 2017-03-30 NOTE — Patient Instructions (Signed)
Description   Continue   taking 1/2 tablet daily except 1 tablet on Sundays and Wednesdays.   Recheck in 12 weeks  Call with any new meds or procedures [phone]

## 2017-04-06 ENCOUNTER — Other Ambulatory Visit (INDEPENDENT_AMBULATORY_CARE_PROVIDER_SITE_OTHER): Payer: Medicare Other

## 2017-04-06 DIAGNOSIS — R944 Abnormal results of kidney function studies: Secondary | ICD-10-CM | POA: Diagnosis not present

## 2017-04-06 LAB — BASIC METABOLIC PANEL
BUN: 14 mg/dL (ref 6–23)
CO2: 26 mEq/L (ref 19–32)
Calcium: 9.1 mg/dL (ref 8.4–10.5)
Chloride: 107 mEq/L (ref 96–112)
Creatinine, Ser: 0.84 mg/dL (ref 0.40–1.20)
GFR: 84.8 mL/min (ref >60.00)
Glucose, Bld: 91 mg/dL (ref 70–99)
POTASSIUM: 4 meq/L (ref 3.5–5.1)
SODIUM: 142 meq/L (ref 135–145)

## 2017-04-09 NOTE — Progress Notes (Deleted)
HPI:  Using dictation device. Unfortunately this device frequently misinterprets words/phrases.  ***  ROS: See pertinent positives and negatives per HPI.  Past Medical History:  Diagnosis Date  . Acute massive pulmonary embolism (HCC) 07/13/2012   Massive PE w/ PEA arrest 07/13/12 >TNK >IVC filter >discharged on comadin    . Diabetes mellitus, type 2 (HCC) 07/15/2012   with peripheral neuropathy  . Hyperlipemia 07/14/2012  . Hypertension 07/14/2012  . Large bowel stricture (HCC)    s/p colectomy in 2016  . Osteoarthritis    s/p hip and knee replacements  . Thyroid disease     Past Surgical History:  Procedure Laterality Date  . COLONOSCOPY N/A 03/12/2016   Procedure: COLONOSCOPY;  Surgeon: Dorena Cookey, MD;  Location: [facility] ENDOSCOPY;  Service: Endoscopy;  Laterality: N/A;  . COLOSTOMY N/A 06/03/2014   Procedure: COLOSTOMY;  Surgeon: Harriette Bouillon, MD;  Location: [facility] OR;  Service: General;  Laterality: N/A;  . FLEXIBLE SIGMOIDOSCOPY N/A 05/30/2014   Procedure: Arnell Sieving;  Surgeon: Jeani Hawking, MD;  Location: [facility] ENDOSCOPY;  Service: Endoscopy;  Laterality: N/A;  . INSERTION OF VENA CAVA FILTER N/A 07/16/2012   Procedure: INSERTION OF VENA CAVA FILTER;  Surgeon: [name], MD;  Location: [facility] CATH LAB;  Service: Cardiovascular;  Laterality: N/A;  . KNEE ARTHROSCOPY    . PARTIAL COLECTOMY N/A 06/03/2014   Procedure: PARTIAL COLECTOMY;  Surgeon: Harriette Bouillon, MD;  Location: [facility] OR;  Service: General;  Laterality: N/A;  . REDUCTION MAMMAPLASTY Bilateral   . SP ARTHRO HIP*L*      Family History  Problem Relation Age of Onset  . Breast cancer Mother     SOCIAL HX: ***   Current Outpatient Medications:  .  acetaminophen (TYLENOL) 500 MG tablet, Take 500 mg by mouth every 6 (six) hours as needed (pain)., Disp: , Rfl:  .  diclofenac sodium (VOLTAREN) 1 % GEL, Apply 2 g topically 4 (four) times daily., Disp: 100 g, Rfl: 0 .  furosemide (LASIX) 20 MG tablet, Take 1  tablet (20 mg total) by mouth daily., Disp: 30 tablet, Rfl: 3 .  levothyroxine (SYNTHROID, LEVOTHROID) 25 MCG tablet, Take 1 tablet by mouth  daily before breakfast (Patient taking differently: Take 25 mcg by mouth daily before breakfast. ), Disp: 90 tablet, Rfl: 1 .  lidocaine (LIDODERM) 5 %, Place 1 patch onto the skin daily. Remove & Discard patch within 12 hours or as directed by MD, Disp: 30 patch, Rfl: 0 .  lisinopril (PRINIVIL,ZESTRIL) 40 MG tablet, TAKE 1 TABLET BY MOUTH ONCE DAILY, Disp: 90 tablet, Rfl: 1 .  metFORMIN (GLUCOPHAGE) 1000 MG tablet, TAKE ONE TABLET BY MOUTH WITH BREAKFAST AND TAKE ONE TABLET WITH DINNER, Disp: 180 tablet, Rfl: 1 .  metoprolol tartrate (LOPRESSOR) 25 MG tablet, Take 1 tablet (25 mg total) by mouth 2 (two) times daily., Disp: 180 tablet, Rfl: 1 .  ONETOUCH DELICA LANCETS FINE MISC, USE ONCE DAILY, Disp: 100 each, Rfl: 3 .  polyethylene glycol (MIRALAX / GLYCOLAX) packet, Take 17 g by mouth daily., Disp: 14 each, Rfl: 0 .  potassium chloride (K-DUR) 10 MEQ tablet, Take as directed--Take one tablet with each furosemide (Lasix) dose (Patient taking differently: Take 10 mEq by mouth See admin instructions. Take as directed--Take one tablet with each furosemide (Lasix) dose), Disp: 30 tablet, Rfl: 6 .  simvastatin (ZOCOR) 20 MG tablet, Take 1 tablet (20 mg total) by mouth at bedtime., Disp: 90 tablet, Rfl: 3 .  triamcinolone cream (KENALOG) 0.1 %,  Apply 1 application topically 2 (two) times daily., Disp: 30 g, Rfl: 0 .  vitamin C (ASCORBIC ACID) 500 MG tablet, Take 500 mg by mouth daily. , Disp: , Rfl:  .  warfarin (COUMADIN) 5 MG tablet, Take as directed by Coumadin Clinic (Patient taking differently: Take 2.5-5 mg by mouth See admin instructions. Take 1 tablet on Monday, Wednesday and Friday then take 1/2 tablet all the other days), Disp: 90 tablet, Rfl: 1  EXAM:  There were no vitals filed for this visit.  There is no height or weight on file to calculate  BMI.  GENERAL: vitals reviewed and listed above, alert, oriented, appears well hydrated and in no acute distress  HEENT: atraumatic, conjunttiva clear, no obvious abnormalities on inspection of external nose and ears  NECK: no obvious masses on inspection  LUNGS: clear to auscultation bilaterally, no wheezes, rales or rhonchi, good air movement  CV: HRRR, no peripheral edema  MS: moves all extremities without noticeable abnormality *** PSYCH: pleasant and cooperative, no obvious depression or anxiety  ASSESSMENT AND PLAN:  Discussed the following assessment and plan:  No diagnosis found.  *** -Patient advised to return or notify a doctor immediately if symptoms worsen or persist or new concerns arise.  There are no Patient Instructions on file for this visit.  [name], DO

## 2017-04-10 ENCOUNTER — Telehealth: Payer: Self-pay | Admitting: *Deleted

## 2017-04-10 ENCOUNTER — Ambulatory Visit: Payer: Medicare Other | Admitting: Family Medicine

## 2017-04-10 NOTE — Telephone Encounter (Signed)
I called the pts daughter [name] and informed her of the message below.  She stated she is at work and I called the pt and left a detailed message at her home number that she does not need to come in today and to keep the appt in June.

## 2017-04-10 NOTE — Telephone Encounter (Signed)
-----   Message from [name], DO sent at 04/09/2017  9:14 PM EDT ----- See other message I sent on this patient.  I am not sure if I marked it urgent.  This patient has an erroneous visit scheduled for later this morning.  We need to call her, cancel appointment and make sure she does not get charged.  It looks like this was not canceled when her regular follow-up was made.

## 2017-04-13 DIAGNOSIS — Z933 Colostomy status: Secondary | ICD-10-CM | POA: Diagnosis not present

## 2017-04-13 DIAGNOSIS — K921 Melena: Secondary | ICD-10-CM | POA: Diagnosis not present

## 2017-04-14 ENCOUNTER — Ambulatory Visit (INDEPENDENT_AMBULATORY_CARE_PROVIDER_SITE_OTHER): Payer: Medicare Other | Admitting: *Deleted

## 2017-04-14 DIAGNOSIS — I2699 Other pulmonary embolism without acute cor pulmonale: Secondary | ICD-10-CM

## 2017-04-14 DIAGNOSIS — Z5181 Encounter for therapeutic drug level monitoring: Secondary | ICD-10-CM | POA: Diagnosis not present

## 2017-04-14 DIAGNOSIS — Z7901 Long term (current) use of anticoagulants: Secondary | ICD-10-CM

## 2017-04-14 LAB — POCT INR: INR: 1.9

## 2017-04-14 NOTE — Patient Instructions (Signed)
Description   Today take 1 tablet then continue taking 1/2 tablet daily except 1 tablet on Sundays and Wednesdays.  Recheck in 2 weeks.  Call with any new meds or procedures [phone]

## 2017-04-28 ENCOUNTER — Ambulatory Visit (INDEPENDENT_AMBULATORY_CARE_PROVIDER_SITE_OTHER): Payer: Medicare Other | Admitting: *Deleted

## 2017-04-28 DIAGNOSIS — Z7901 Long term (current) use of anticoagulants: Secondary | ICD-10-CM | POA: Diagnosis not present

## 2017-04-28 DIAGNOSIS — I2699 Other pulmonary embolism without acute cor pulmonale: Secondary | ICD-10-CM

## 2017-04-28 DIAGNOSIS — Z5181 Encounter for therapeutic drug level monitoring: Secondary | ICD-10-CM | POA: Diagnosis not present

## 2017-04-28 LAB — POCT INR: INR: 2

## 2017-04-28 NOTE — Patient Instructions (Signed)
Description   Today take 1 tablet then change your dose to 1/2 tablet daily except 1 tablet on Sundays, Tuesdays and Thursdays. Recheck in 2 weeks.  Call with any new meds or procedures [phone]

## 2017-05-08 DIAGNOSIS — Z933 Colostomy status: Secondary | ICD-10-CM | POA: Diagnosis not present

## 2017-05-12 ENCOUNTER — Ambulatory Visit: Payer: Medicare Other

## 2017-05-12 DIAGNOSIS — I2699 Other pulmonary embolism without acute cor pulmonale: Secondary | ICD-10-CM

## 2017-05-12 DIAGNOSIS — Z7901 Long term (current) use of anticoagulants: Secondary | ICD-10-CM | POA: Diagnosis not present

## 2017-05-12 DIAGNOSIS — Z5181 Encounter for therapeutic drug level monitoring: Secondary | ICD-10-CM | POA: Diagnosis not present

## 2017-05-12 LAB — POCT INR: INR: 3.2

## 2017-05-12 NOTE — Patient Instructions (Signed)
Description   Continue on same dosage 1/2 tablet daily except 1 tablet on Sundays, Tuesdays and Thursdays. Recheck in 3 weeks.  Call with any new meds or procedures [phone]     

## 2017-05-17 ENCOUNTER — Other Ambulatory Visit: Payer: Self-pay | Admitting: Family Medicine

## 2017-05-26 ENCOUNTER — Telehealth: Payer: Self-pay | Admitting: Family Medicine

## 2017-05-26 MED ORDER — GLUCOSE BLOOD VI STRP: ORAL_STRIP | 3 refills | Status: DC

## 2017-05-26 NOTE — Telephone Encounter (Signed)
One touch test strips refill Last OV: 03/09/17 Last Refill: test strips not on med list  Pharmacy:[facility] PCP: Dr. [name]

## 2017-05-26 NOTE — Telephone Encounter (Signed)
Copied from CRM #[id number]. Topic: Quick Communication - Rx Refill/Question °>> May 26, 2017  9:28 AM Sebastian, Jennifer S wrote: °Medication: one touch test strips  ° °Has the patient contacted their pharmacy? Yes.  No refills avail °(Agent: If no, request that the patient contact the pharmacy for the refill.) °(Agent: If yes, when and what did the pharmacy advise?) ° °Preferred Pharmacy (with phone number or street name): [facility] on [address] °Agent: Please be advised that RX refills may take up to 3 business days. We ask that you follow-up with your pharmacy. °

## 2017-05-26 NOTE — Telephone Encounter (Signed)
Rx done. 

## 2017-05-26 NOTE — Addendum Note (Signed)
Addended by: Johnella Moloney on: 05/26/2017 10:23 AM   Modules accepted: Orders

## 2017-06-01 ENCOUNTER — Ambulatory Visit: Payer: Medicare Other | Admitting: *Deleted

## 2017-06-01 DIAGNOSIS — I2699 Other pulmonary embolism without acute cor pulmonale: Secondary | ICD-10-CM

## 2017-06-01 DIAGNOSIS — Z86718 Personal history of other venous thrombosis and embolism: Secondary | ICD-10-CM

## 2017-06-01 DIAGNOSIS — Z86711 Personal history of pulmonary embolism: Secondary | ICD-10-CM | POA: Diagnosis not present

## 2017-06-01 DIAGNOSIS — Z5181 Encounter for therapeutic drug level monitoring: Secondary | ICD-10-CM | POA: Diagnosis not present

## 2017-06-01 DIAGNOSIS — Z7901 Long term (current) use of anticoagulants: Secondary | ICD-10-CM | POA: Diagnosis not present

## 2017-06-01 LAB — POCT INR: INR: 1.9 — AB (ref 2.0–3.0)

## 2017-06-01 NOTE — Patient Instructions (Signed)
Description   Today May 30th take 1 and 1/2 tablets then continue on same dosage 1/2 tablet daily except 1 tablet on Sundays, Tuesdays and Thursdays. Recheck in 2 weeks.  Call with any new meds or procedures [phone]

## 2017-06-02 ENCOUNTER — Other Ambulatory Visit: Payer: Self-pay | Admitting: Family Medicine

## 2017-06-02 NOTE — Telephone Encounter (Signed)
I left a detailed message at the pts home number asking that she call back as we need to actual name of which OneTouch glucometer she has to send the correct test stripts.

## 2017-06-02 NOTE — Telephone Encounter (Signed)
[facility] called and spoke to [place], Pharmacologist. I asked did they receive the glucose test strip prescription sent on 05/26/17 as noted in chart, she says the prescription was not received. They will need a new prescription sent with the specific type of strips needed, diagnosis code, and how often to check the blood sugar for insurance to pay.  One Touch Test Strips Last OV:03/21/17 PCP:Kim Pharmacy: [facility] [address] [place] - [address] [phone] (Phone) [phone] (Fax)

## 2017-06-02 NOTE — Telephone Encounter (Unsigned)
Copied from CRM [id number]. Topic: Quick Communication - Rx Refill/Question >> May 26, 2017  9:28 AM Oneal Grout wrote: Medication: one touch test strips   Has the patient contacted their pharmacy? Yes.  No refills avail (Agent: If no, request that the patient contact the pharmacy for the refill.) (Agent: If yes, when and what did the pharmacy advise?)  Preferred Pharmacy (with phone number or street name): [facility] on [address] Agent: Please be advised that RX refills may take up to 3 business days. We ask that you follow-up with your pharmacy.

## 2017-06-08 DIAGNOSIS — Z4801 Encounter for change or removal of surgical wound dressing: Secondary | ICD-10-CM | POA: Diagnosis not present

## 2017-06-08 DIAGNOSIS — Z933 Colostomy status: Secondary | ICD-10-CM | POA: Diagnosis not present

## 2017-06-08 DIAGNOSIS — K56609 Unspecified intestinal obstruction, unspecified as to partial versus complete obstruction: Secondary | ICD-10-CM | POA: Diagnosis not present

## 2017-06-08 DIAGNOSIS — E119 Type 2 diabetes mellitus without complications: Secondary | ICD-10-CM | POA: Diagnosis not present

## 2017-06-09 ENCOUNTER — Telehealth: Payer: Self-pay | Admitting: Family Medicine

## 2017-06-09 MED ORDER — METOPROLOL TARTRATE 25 MG PO TABS: 25.0000 mg | ORAL_TABLET | Freq: Two times a day (BID) | ORAL | 1 refills | Status: DC

## 2017-06-09 NOTE — Telephone Encounter (Signed)
Copied from CRM [id number]. Topic: Quick Communication - Rx Refill/Question >> Jun 09, 2017  1:09 PM Gerrianne Scale wrote: Medication: metoprolol tartrate (LOPRESSOR) 25 MG tablet  Has the patient contacted their pharmacy? Yes.  Pharmacy said that they didn't have a order for medicine that's all they said to pt (Agent: If no, request that the patient contact the pharmacy for the refill.) (Agent: If yes, when and what did the pharmacy advise?)  Preferred Pharmacy (with phone number or street name):     [facility] [place], [place] - [address] [phone] (Phone) [phone] (Fax)      Agent: Please be advised that RX refills may take up to 3 business days. We ask that you follow-up with your pharmacy.

## 2017-06-15 ENCOUNTER — Ambulatory Visit: Payer: Medicare Other | Admitting: *Deleted

## 2017-06-15 DIAGNOSIS — Z5181 Encounter for therapeutic drug level monitoring: Secondary | ICD-10-CM | POA: Diagnosis not present

## 2017-06-15 DIAGNOSIS — Z7901 Long term (current) use of anticoagulants: Secondary | ICD-10-CM | POA: Diagnosis not present

## 2017-06-15 DIAGNOSIS — I2699 Other pulmonary embolism without acute cor pulmonale: Secondary | ICD-10-CM | POA: Diagnosis not present

## 2017-06-15 LAB — POCT INR: INR: 3.5 — AB (ref 2.0–3.0)

## 2017-06-15 NOTE — Patient Instructions (Signed)
Description   Continue on same dosage 1/2 tablet daily except 1 tablet on Sundays, Tuesdays and Thursdays. Recheck in 3 weeks.  Call with any new meds or procedures [phone]

## 2017-06-21 NOTE — Progress Notes (Signed)
HPI:  Using dictation device. Unfortunately this device frequently misinterprets words/phrases.  Hannah [name] is a pleasant 77 y.o. here for follow up. Chronic medical problems summarized below were reviewed for changes and stability and were updated as needed below. These issues and their treatment remain stable for the most part.  Reports doing well. Reports monitors blood sugars. No low blood sugars. Denies CP, SOB, DOE, treatment intolerance or new symptoms. Due for hgba1c, TSH, CBC; diabetic eye exam  AWV 10/28/16  Hypothyroidism:  -on synthroid chronically   DM w/ neuropathy:  -current meds:metformin 1000mg  bid;used to take glipizide  Hx PE and cardiogenic shock, HTN, HLD:  -followed by Dr. Tenny Craw in Cardiology  -on coumadin managed by [facility] coumadin clinic, statin, metoprolol  -GI bleed with admission in 03/2017  History knee, hip and back issues: -Uses scat Transportation as has a lot of pain when walks more than several blocks, has difficulty boarding a bus or walking up hills -Uses cane to ambulate or walker -Has seen orthopedics in the past, [facility] -Flare of sciatic pain in 2019  Hx GI bleed/Hx colectomy for stricture/Colostomy: -sees Dr. Dulce Sellar in GI  Dr. Johny Shock on FMLA for taking pt to Dr. Visits  ROS: See pertinent positives and negatives per HPI.  Past Medical History:  Diagnosis Date  . Acute massive pulmonary embolism (HCC) 07/13/2012   Massive PE w/ PEA arrest 07/13/12 >TNK >IVC filter >discharged on comadin    . Diabetes mellitus, type 2 (HCC) 07/15/2012   with peripheral neuropathy  . Hyperlipemia 07/14/2012  . Hypertension 07/14/2012  . Large bowel stricture (HCC)    s/p colectomy in 2016  . Osteoarthritis    s/p hip and knee replacements  . Thyroid disease     Past Surgical History:  Procedure Laterality Date  . COLONOSCOPY N/A 03/12/2016   Procedure: COLONOSCOPY;  Surgeon: Dorena Cookey, MD;  Location: [facility]  ENDOSCOPY;  Service: Endoscopy;  Laterality: N/A;  . COLOSTOMY N/A 06/03/2014   Procedure: COLOSTOMY;  Surgeon: Harriette Bouillon, MD;  Location: [facility] OR;  Service: General;  Laterality: N/A;  . FLEXIBLE SIGMOIDOSCOPY N/A 05/30/2014   Procedure: Arnell Sieving;  Surgeon: Jeani Hawking, MD;  Location: [facility] ENDOSCOPY;  Service: Endoscopy;  Laterality: N/A;  . INSERTION OF VENA CAVA FILTER N/A 07/16/2012   Procedure: INSERTION OF VENA CAVA FILTER;  Surgeon: [name], MD;  Location: [facility] CATH LAB;  Service: Cardiovascular;  Laterality: N/A;  . KNEE ARTHROSCOPY    . PARTIAL COLECTOMY N/A 06/03/2014   Procedure: PARTIAL COLECTOMY;  Surgeon: Harriette Bouillon, MD;  Location: [facility] OR;  Service: General;  Laterality: N/A;  . REDUCTION MAMMAPLASTY Bilateral   . SP ARTHRO HIP*L*      Family History  Problem Relation Age of Onset  . Breast cancer Mother     SOCIAL HX: see hpi   Current Outpatient Medications:  .  acetaminophen (TYLENOL) 500 MG tablet, Take 500 mg by mouth every 6 (six) hours as needed (pain)., Disp: , Rfl:  .  furosemide (LASIX) 20 MG tablet, Take 1 tablet (20 mg total) by mouth daily., Disp: 30 tablet, Rfl: 3 .  glucose blood test strip, Use as instructed once a day, Disp: 100 each, Rfl: 3 .  levothyroxine (SYNTHROID, LEVOTHROID) 25 MCG tablet, Take 1 tablet by mouth  daily before breakfast (Patient taking differently: Take 25 mcg by mouth daily before breakfast. ), Disp: 90 tablet, Rfl: 1 .  lisinopril (PRINIVIL,ZESTRIL) 40 MG tablet, TAKE 1 TABLET BY  MOUTH ONCE DAILY, Disp: 90 tablet, Rfl: 1 .  metFORMIN (GLUCOPHAGE) 1000 MG tablet, TAKE ONE TABLET BY MOUTH WITH BREAKFAST AND TAKE ONE TABLET WITH DINNER, Disp: 180 tablet, Rfl: 1 .  metoprolol tartrate (LOPRESSOR) 25 MG tablet, Take 1 tablet (25 mg total) by mouth 2 (two) times daily., Disp: 180 tablet, Rfl: 1 .  ONETOUCH DELICA LANCETS FINE MISC, USE ONCE DAILY., Disp: 100 each, Rfl: 3 .  polyethylene glycol (MIRALAX / GLYCOLAX)  packet, Take 17 g by mouth daily., Disp: 14 each, Rfl: 0 .  potassium chloride (K-DUR) 10 MEQ tablet, Take as directed--Take one tablet with each furosemide (Lasix) dose (Patient taking differently: Take 10 mEq by mouth See admin instructions. Take as directed--Take one tablet with each furosemide (Lasix) dose), Disp: 30 tablet, Rfl: 6 .  simvastatin (ZOCOR) 20 MG tablet, Take 1 tablet (20 mg total) by mouth at bedtime., Disp: 90 tablet, Rfl: 3 .  vitamin C (ASCORBIC ACID) 500 MG tablet, Take 500 mg by mouth daily. , Disp: , Rfl:  .  warfarin (COUMADIN) 5 MG tablet, Take as directed by Coumadin Clinic (Patient taking differently: Take 2.5-5 mg by mouth See admin instructions. Take 1 tablet on Monday, Wednesday and Friday then take 1/2 tablet all the other days), Disp: 90 tablet, Rfl: 1  EXAM:  Vitals:   06/22/17 1447  BP: 122/82  Pulse: 90  Temp: 98.1 F (36.7 C)  SpO2: 97%    Body mass index is 26.47 kg/m.  GENERAL: vitals reviewed and listed above, alert, oriented, appears well hydrated and in no acute distress  HEENT: atraumatic, conjunttiva clear, no obvious abnormalities on inspection of external nose and ears  NECK: no obvious masses on inspection  LUNGS: clear to auscultation bilaterally, no wheezes, rales or rhonchi, good air movement  CV: HRRR, tr ankle edema  MS: moves all extremities without noticeable abnormality - uses cane  PSYCH: pleasant and cooperative, no obvious depression or anxiety  ASSESSMENT AND PLAN:  Discussed the following assessment and plan:  Diabetes mellitus with complication (HCC) -check hgba1c -annual diabetic eye exam advised -cont current tx pending labs  Hypertension associated with diabetes (HCC) -check cbc, bmp done a few months ag -BP ok today, cont tx with cardiology  Hyperlipidemia associated with type 2 diabetes mellitus (HCC) -cont current tx  Chronic diastolic CHF (congestive heart failure) (HCC) Hypercoagulable state  (HCC) -sees cardiology for managment  Hypothyroid -check labs  -labs per orders -continue current medications pending results -follow up 3 months, sooner as needed  Patient Instructions  BEFORE YOU LEAVE: -Labs  -follow up: 3 months  We have ordered labs or studies at this visit. It can take up to 1-2 weeks for results and processing. IF results require follow up or explanation, we will call you with instructions. Clinically stable results will be released to your [facility]. If you have not heard from [place] or cannot find your results in [facility] in 2 weeks please contact our office at [phone].  If you are not yet signed up for [facility], please consider signing up.  Please schedule your diabetic eye exam yearly.         [name], DO

## 2017-06-22 ENCOUNTER — Encounter: Payer: Self-pay | Admitting: Family Medicine

## 2017-06-22 ENCOUNTER — Ambulatory Visit (INDEPENDENT_AMBULATORY_CARE_PROVIDER_SITE_OTHER): Payer: Medicare Other | Admitting: Family Medicine

## 2017-06-22 VITALS — BP 122/82 | HR 90 | Temp 98.1°F | Wt 169.0 lb

## 2017-06-22 DIAGNOSIS — E1169 Type 2 diabetes mellitus with other specified complication: Secondary | ICD-10-CM | POA: Diagnosis not present

## 2017-06-22 DIAGNOSIS — E118 Type 2 diabetes mellitus with unspecified complications: Secondary | ICD-10-CM | POA: Diagnosis not present

## 2017-06-22 DIAGNOSIS — D6859 Other primary thrombophilia: Secondary | ICD-10-CM

## 2017-06-22 DIAGNOSIS — E1159 Type 2 diabetes mellitus with other circulatory complications: Secondary | ICD-10-CM

## 2017-06-22 DIAGNOSIS — E785 Hyperlipidemia, unspecified: Secondary | ICD-10-CM

## 2017-06-22 DIAGNOSIS — I10 Essential (primary) hypertension: Secondary | ICD-10-CM

## 2017-06-22 DIAGNOSIS — E038 Other specified hypothyroidism: Secondary | ICD-10-CM | POA: Diagnosis not present

## 2017-06-22 DIAGNOSIS — I5032 Chronic diastolic (congestive) heart failure: Secondary | ICD-10-CM | POA: Diagnosis not present

## 2017-06-22 DIAGNOSIS — I152 Hypertension secondary to endocrine disorders: Secondary | ICD-10-CM

## 2017-06-22 NOTE — Patient Instructions (Signed)
BEFORE YOU LEAVE: -Labs  -follow up: 3 months  We have ordered labs or studies at this visit. It can take up to 1-2 weeks for results and processing. IF results require follow up or explanation, we will call you with instructions. Clinically stable results will be released to your [facility]. If you have not heard from [place] or cannot find your results in [facility] in 2 weeks please contact our office at [phone].  If you are not yet signed up for [facility], please consider signing up.  Please schedule your diabetic eye exam yearly.

## 2017-07-05 ENCOUNTER — Ambulatory Visit (INDEPENDENT_AMBULATORY_CARE_PROVIDER_SITE_OTHER): Payer: Medicare Other | Admitting: Pharmacist

## 2017-07-05 DIAGNOSIS — Z7901 Long term (current) use of anticoagulants: Secondary | ICD-10-CM

## 2017-07-05 DIAGNOSIS — Z5181 Encounter for therapeutic drug level monitoring: Secondary | ICD-10-CM | POA: Diagnosis not present

## 2017-07-05 DIAGNOSIS — I2699 Other pulmonary embolism without acute cor pulmonale: Secondary | ICD-10-CM

## 2017-07-05 LAB — POCT INR: INR: 3.2 — AB (ref 2.0–3.0)

## 2017-07-05 NOTE — Patient Instructions (Signed)
Description   Continue on same dosage 1/2 tablet daily except 1 tablet on Sundays, Tuesdays and Thursdays. Recheck in 4 weeks.  Call with any new meds or procedures [phone]     

## 2017-08-02 ENCOUNTER — Ambulatory Visit: Payer: Medicare Other | Admitting: *Deleted

## 2017-08-02 DIAGNOSIS — I2699 Other pulmonary embolism without acute cor pulmonale: Secondary | ICD-10-CM

## 2017-08-02 DIAGNOSIS — Z7901 Long term (current) use of anticoagulants: Secondary | ICD-10-CM | POA: Diagnosis not present

## 2017-08-02 DIAGNOSIS — Z5181 Encounter for therapeutic drug level monitoring: Secondary | ICD-10-CM

## 2017-08-02 LAB — POCT INR: INR: 2.6 (ref 2.0–3.0)

## 2017-08-02 NOTE — Patient Instructions (Signed)
Description   Continue on same dosage 1/2 tablet daily except 1 tablet on Sundays, Tuesdays and Thursdays. Recheck in 4 weeks.  Call with any new meds or procedures [phone]     

## 2017-08-30 ENCOUNTER — Ambulatory Visit: Payer: Medicare Other | Admitting: Pharmacist

## 2017-08-30 DIAGNOSIS — I2699 Other pulmonary embolism without acute cor pulmonale: Secondary | ICD-10-CM | POA: Diagnosis not present

## 2017-08-30 DIAGNOSIS — Z7901 Long term (current) use of anticoagulants: Secondary | ICD-10-CM | POA: Diagnosis not present

## 2017-08-30 DIAGNOSIS — Z5181 Encounter for therapeutic drug level monitoring: Secondary | ICD-10-CM

## 2017-08-30 LAB — POCT INR: INR: 2.4 (ref 2.0–3.0)

## 2017-08-30 NOTE — Patient Instructions (Signed)
Description   Today take 1 tablet then continue on same dosage 1/2 tablet daily except 1 tablet on Sundays, Tuesdays and Thursdays. Recheck in 3 weeks.  Call with any new meds or procedures [phone]

## 2017-09-14 DIAGNOSIS — Z933 Colostomy status: Secondary | ICD-10-CM | POA: Diagnosis not present

## 2017-09-14 DIAGNOSIS — E119 Type 2 diabetes mellitus without complications: Secondary | ICD-10-CM | POA: Diagnosis not present

## 2017-09-14 DIAGNOSIS — Z4801 Encounter for change or removal of surgical wound dressing: Secondary | ICD-10-CM | POA: Diagnosis not present

## 2017-09-14 DIAGNOSIS — K56609 Unspecified intestinal obstruction, unspecified as to partial versus complete obstruction: Secondary | ICD-10-CM | POA: Diagnosis not present

## 2017-09-18 NOTE — Progress Notes (Signed)
HPI:  Using dictation device. Unfortunately this device frequently misinterprets words/phrases.  Hannah [name] is a pleasant 77 y.o. here for follow up. Chronic medical problems summarized below were reviewed for changes and stability and were updated as needed below. These issues and their treatment remain stable for the most part. Reports is doing ok. Unfortunately, a friend from church passed away last week. Suffering some bereavement, but has good support with family and friends. Denies CP, SOB, DOE, treatment intolerance or new symptoms. Due for labs (looks like did not do when last orderd - bmp, cbc, hgba1c, tsh), foot exam, flu shot. Reports did eye exam in April with [facility]omology  AWV 10/28/16  Hypothyroidism:  -on synthroid chronically   DM w/ neuropathy:  -current meds:metformin 1000mg  bid;used to take glipizide  Hx PE and cardiogenic shock, HTN, HLD:  -followed by Dr. Tenny Craw in Cardiology  -on coumadin managed by [facility] coumadin clinic, statin, metoprolol  -GI bleed with admission in 03/2017  History knee, hip and back issues: -Uses scat Transportation as has a lot of pain when walks more than several blocks,has difficulty boarding a bus or walking up hills -Uses cane to ambulate or walker -Has seen orthopedics in the past, [facility] -Flare of sciatic pain in 2019  Hx GI bleed/Hx colectomy for stricture/Colostomy: -sees Dr. Dulce Sellar in GI  Caregiver Linward Foster on [facility] for taking pt to Dr. Visits  ROS: See pertinent positives and negatives per HPI.  Past Medical History:  Diagnosis Date  . Acute massive pulmonary embolism (HCC) 07/13/2012   Massive PE w/ PEA arrest 07/13/12 >TNK >IVC filter >discharged on comadin    . Diabetes mellitus, type 2 (HCC) 07/15/2012   with peripheral neuropathy  . Hyperlipemia 07/14/2012  . Hypertension 07/14/2012  . Large bowel stricture (HCC)    s/p colectomy in 2016  . Osteoarthritis    s/p hip and knee  replacements  . Thyroid disease     Past Surgical History:  Procedure Laterality Date  . COLONOSCOPY N/A 03/12/2016   Procedure: COLONOSCOPY;  Surgeon: Dorena Cookey, MD;  Location: [facility] ENDOSCOPY;  Service: Endoscopy;  Laterality: N/A;  . COLOSTOMY N/A 06/03/2014   Procedure: COLOSTOMY;  Surgeon: Harriette Bouillon, MD;  Location: [facility] OR;  Service: General;  Laterality: N/A;  . FLEXIBLE SIGMOIDOSCOPY N/A 05/30/2014   Procedure: Arnell Sieving;  Surgeon: Jeani Hawking, MD;  Location: [facility] ENDOSCOPY;  Service: Endoscopy;  Laterality: N/A;  . INSERTION OF VENA CAVA FILTER N/A 07/16/2012   Procedure: INSERTION OF VENA CAVA FILTER;  Surgeon: [name], MD;  Location: [facility] CATH LAB;  Service: Cardiovascular;  Laterality: N/A;  . KNEE ARTHROSCOPY    . PARTIAL COLECTOMY N/A 06/03/2014   Procedure: PARTIAL COLECTOMY;  Surgeon: Harriette Bouillon, MD;  Location: [facility] OR;  Service: General;  Laterality: N/A;  . REDUCTION MAMMAPLASTY Bilateral   . SP ARTHRO HIP*L*      Family History  Problem Relation Age of Onset  . Breast cancer Mother     SOCIAL HX: see hpi   Current Outpatient Medications:  .  acetaminophen (TYLENOL) 500 MG tablet, Take 500 mg by mouth every 6 (six) hours as needed (pain)., Disp: , Rfl:  .  furosemide (LASIX) 20 MG tablet, Take 1 tablet (20 mg total) by mouth daily. (Patient taking differently: Take 20 mg by mouth as needed. Pt States PRN), Disp: 30 tablet, Rfl: 3 .  glucose blood test strip, Use as instructed once a day, Disp: 100 each, Rfl: 3 .  levothyroxine (SYNTHROID, LEVOTHROID) 25 MCG tablet, Take 1 tablet by mouth  daily before breakfast (Patient taking differently: Take 25 mcg by mouth daily before breakfast. ), Disp: 90 tablet, Rfl: 1 .  lisinopril (PRINIVIL,ZESTRIL) 40 MG tablet, TAKE 1 TABLET BY MOUTH ONCE DAILY, Disp: 90 tablet, Rfl: 1 .  metFORMIN (GLUCOPHAGE) 1000 MG tablet, TAKE ONE TABLET BY MOUTH WITH BREAKFAST AND TAKE ONE TABLET WITH DINNER, Disp: 180 tablet,  Rfl: 1 .  metoprolol tartrate (LOPRESSOR) 25 MG tablet, Take 1 tablet (25 mg total) by mouth 2 (two) times daily., Disp: 180 tablet, Rfl: 1 .  ONETOUCH DELICA LANCETS FINE MISC, USE ONCE DAILY., Disp: 100 each, Rfl: 3 .  polyethylene glycol (MIRALAX / GLYCOLAX) packet, Take 17 g by mouth daily. (Patient taking differently: Take 17 g by mouth daily as needed. ), Disp: 14 each, Rfl: 0 .  potassium chloride (K-DUR) 10 MEQ tablet, Take as directed--Take one tablet with each furosemide (Lasix) dose (Patient taking differently: Take 10 mEq by mouth as needed. Take as directed--Take one tablet with each furosemide (Lasix) dose), Disp: 30 tablet, Rfl: 6 .  simvastatin (ZOCOR) 20 MG tablet, Take 1 tablet (20 mg total) by mouth at bedtime., Disp: 90 tablet, Rfl: 3 .  vitamin C (ASCORBIC ACID) 500 MG tablet, Take 500 mg by mouth daily. , Disp: , Rfl:  .  warfarin (COUMADIN) 5 MG tablet, Take as directed by Coumadin Clinic (Patient taking differently: Take 2.5-5 mg by mouth See admin instructions. Take 1 tablet on Monday, Wednesday and Friday then take 1/2 tablet all the other days), Disp: 90 tablet, Rfl: 1  EXAM:  Vitals:   09/19/17 1042  BP: 118/70  Pulse: 68  Temp: 98 F (36.7 C)    Body mass index is 26.31 kg/m.  GENERAL: vitals reviewed and listed above, alert, oriented, appears well hydrated and in no acute distress  HEENT: atraumatic, conjunttiva clear, no obvious abnormalities on inspection of external nose and ears  NECK: no obvious masses on inspection  LUNGS: clear to auscultation bilaterally, no wheezes, rales or rhonchi, good air movement  CV: HRRR, no peripheral edema  MS: moves all extremities without noticeable abnormality  FOOT exam: see epic  PSYCH: pleasant and cooperative, no obvious depression or anxiety  ASSESSMENT AND PLAN:  Discussed the following assessment and plan:  Callus of foot - Plan: Ambulatory referral to Podiatry  Onychomycosis of toenail - Plan:  Ambulatory referral to Podiatry  Controlled type 2 diabetes mellitus with diabetic neuropathy, without long-term current use of insulin (HCC) - Plan: Ambulatory referral to Podiatry, Hemoglobin A1c  Hypertension associated with diabetes (HCC) - Plan: Basic metabolic panel, CBC  Hyperlipidemia associated with type 2 diabetes mellitus (HCC)  Other specified hypothyroidism - Plan: TSH  -refer podiatry for foot issues, she reports saw podiatry in the past remotely but prefers new referral to ensure insurance coverage -refills per staff -labs per orders -flu shot today per pt preference -cont current tx -bp elevated on arrival, but much better on recheck - she reports was anxious about appt -Patient advised to return or notify a doctor immediately if symptoms worsen or persist or new concerns arise.  Patient Instructions  BEFORE YOU LEAVE: -flu shot -refills as needed -lab -follow up: AWV and CPE in 3 months  -We placed a referral for you as discussed to the foot doctor (Podiatry) about the calluses and the toenails. It usually takes about 1-2 weeks to process and schedule this referral. If you have  not heard from [place] regarding this appointment in 2 weeks please contact our office.  We have ordered labs or studies at this visit. It can take up to 1-2 weeks for results and processing. IF results require follow up or explanation, we will call you with instructions. Clinically stable results will be released to your [facility]. If you have not heard from [place] or cannot find your results in [facility] in 2 weeks please contact our office at [phone].  If you are not yet signed up for [facility], please consider signing up.             [name], DO

## 2017-09-19 ENCOUNTER — Ambulatory Visit (INDEPENDENT_AMBULATORY_CARE_PROVIDER_SITE_OTHER): Payer: Medicare Other | Admitting: Family Medicine

## 2017-09-19 ENCOUNTER — Encounter: Payer: Self-pay | Admitting: Family Medicine

## 2017-09-19 VITALS — BP 118/70 | HR 68 | Temp 98.0°F | Ht 67.0 in | Wt 168.0 lb

## 2017-09-19 DIAGNOSIS — E1159 Type 2 diabetes mellitus with other circulatory complications: Secondary | ICD-10-CM | POA: Diagnosis not present

## 2017-09-19 DIAGNOSIS — I10 Essential (primary) hypertension: Secondary | ICD-10-CM

## 2017-09-19 DIAGNOSIS — E038 Other specified hypothyroidism: Secondary | ICD-10-CM | POA: Diagnosis not present

## 2017-09-19 DIAGNOSIS — Z23 Encounter for immunization: Secondary | ICD-10-CM

## 2017-09-19 DIAGNOSIS — E1140 Type 2 diabetes mellitus with diabetic neuropathy, unspecified: Secondary | ICD-10-CM

## 2017-09-19 DIAGNOSIS — L84 Corns and callosities: Secondary | ICD-10-CM

## 2017-09-19 DIAGNOSIS — E1169 Type 2 diabetes mellitus with other specified complication: Secondary | ICD-10-CM

## 2017-09-19 DIAGNOSIS — E785 Hyperlipidemia, unspecified: Secondary | ICD-10-CM

## 2017-09-19 DIAGNOSIS — B351 Tinea unguium: Secondary | ICD-10-CM

## 2017-09-19 DIAGNOSIS — I152 Hypertension secondary to endocrine disorders: Secondary | ICD-10-CM

## 2017-09-19 LAB — TSH: TSH: 1.37 u[IU]/mL (ref 0.35–4.50)

## 2017-09-19 LAB — BASIC METABOLIC PANEL
BUN: 17 mg/dL (ref 6–23)
CALCIUM: 9.1 mg/dL (ref 8.4–10.5)
CO2: 25 mEq/L (ref 19–32)
CREATININE: 0.89 mg/dL (ref 0.40–1.20)
Chloride: 107 mEq/L (ref 96–112)
GFR: 79.23 mL/min (ref >60.00)
Glucose, Bld: 122 mg/dL — ABNORMAL HIGH (ref 70–99)
Potassium: 3.7 mEq/L (ref 3.5–5.1)
SODIUM: 142 meq/L (ref 135–145)

## 2017-09-19 LAB — HEMOGLOBIN A1C: HEMOGLOBIN A1C: 6.4 % (ref 4.6–6.5)

## 2017-09-19 LAB — CBC
HCT: 38.9 % (ref 36.0–46.0)
Hemoglobin: 12.6 g/dL (ref 12.0–15.0)
MCHC: 32.3 g/dL (ref 30.0–36.0)
MCV: 85.9 fl (ref 78.0–100.0)
PLATELETS: 233 10*3/uL (ref 150.0–400.0)
RBC: 4.53 Mil/uL (ref 3.87–5.11)
RDW: 17.1 % — ABNORMAL HIGH (ref 11.5–15.5)
WBC: 7.6 10*3/uL (ref 4.0–10.5)

## 2017-09-19 MED ORDER — LEVOTHYROXINE SODIUM 25 MCG PO TABS: ORAL_TABLET | ORAL | 1 refills | Status: DC

## 2017-09-19 MED ORDER — GLUCOSE BLOOD VI STRP: ORAL_STRIP | 5 refills | Status: DC

## 2017-09-19 NOTE — Patient Instructions (Addendum)
BEFORE YOU LEAVE: -flu shot -refills as needed -lab -follow up: AWV and CPE in 3 months  -We placed a referral for you as discussed to the foot doctor (Podiatry) about the calluses and the toenails. It usually takes about 1-2 weeks to process and schedule this referral. If you have not heard from [place] regarding this appointment in 2 weeks please contact our office.  We have ordered labs or studies at this visit. It can take up to 1-2 weeks for results and processing. IF results require follow up or explanation, we will call you with instructions. Clinically stable results will be released to your [facility]. If you have not heard from [place] or cannot find your results in [facility] in 2 weeks please contact our office at [phone].  If you are not yet signed up for [facility], please consider signing up.

## 2017-09-19 NOTE — Addendum Note (Signed)
Addended by: Johnella Moloney on: 09/19/2017 11:35 AM   Modules accepted: Orders

## 2017-09-19 NOTE — Addendum Note (Signed)
Addended by: Sallee Lange A on: 09/19/2017 11:40 AM   Modules accepted: Orders

## 2017-09-20 ENCOUNTER — Ambulatory Visit: Payer: Medicare Other | Admitting: *Deleted

## 2017-09-20 ENCOUNTER — Other Ambulatory Visit: Payer: Self-pay

## 2017-09-20 DIAGNOSIS — Z7901 Long term (current) use of anticoagulants: Secondary | ICD-10-CM

## 2017-09-20 DIAGNOSIS — Z5181 Encounter for therapeutic drug level monitoring: Secondary | ICD-10-CM | POA: Diagnosis not present

## 2017-09-20 DIAGNOSIS — I2699 Other pulmonary embolism without acute cor pulmonale: Secondary | ICD-10-CM | POA: Diagnosis not present

## 2017-09-20 LAB — POCT INR: INR: 2.7 (ref 2.0–3.0)

## 2017-09-20 MED ORDER — WARFARIN SODIUM 5 MG PO TABS: ORAL_TABLET | ORAL | 1 refills | Status: DC

## 2017-09-20 NOTE — Patient Instructions (Signed)
Description   Continue on same dosage 1/2 tablet daily except 1 tablet on Sundays, Tuesdays and Thursdays. Recheck in 4 weeks.  Call with any new meds or procedures [phone]

## 2017-09-21 ENCOUNTER — Ambulatory Visit: Payer: Medicare Other | Admitting: Family Medicine

## 2017-10-02 ENCOUNTER — Other Ambulatory Visit: Payer: Self-pay | Admitting: Family Medicine

## 2017-10-02 DIAGNOSIS — Z1231 Encounter for screening mammogram for malignant neoplasm of breast: Secondary | ICD-10-CM

## 2017-10-18 ENCOUNTER — Ambulatory Visit: Payer: Medicare Other | Admitting: *Deleted

## 2017-10-18 DIAGNOSIS — Z7901 Long term (current) use of anticoagulants: Secondary | ICD-10-CM | POA: Diagnosis not present

## 2017-10-18 DIAGNOSIS — I2699 Other pulmonary embolism without acute cor pulmonale: Secondary | ICD-10-CM | POA: Diagnosis not present

## 2017-10-18 DIAGNOSIS — Z5181 Encounter for therapeutic drug level monitoring: Secondary | ICD-10-CM

## 2017-10-18 LAB — POCT INR: INR: 3.2 — AB (ref 2.0–3.0)

## 2017-10-18 NOTE — Patient Instructions (Signed)
Description   Continue on same dosage 1/2 tablet daily except 1 tablet on Sundays, Tuesdays and Thursdays. Recheck in 4 weeks.  Call with any new meds or procedures [phone]     

## 2017-10-20 ENCOUNTER — Other Ambulatory Visit: Payer: Self-pay | Admitting: Family Medicine

## 2017-10-24 ENCOUNTER — Other Ambulatory Visit: Payer: Self-pay

## 2017-10-24 ENCOUNTER — Emergency Department (HOSPITAL_COMMUNITY)
Admission: EM | Admit: 2017-10-24 | Discharge: 2017-10-24 | Disposition: A | Discharge status: Home / Self Care | Payer: Medicare Other | Attending: Emergency Medicine | Admitting: Emergency Medicine

## 2017-10-24 ENCOUNTER — Emergency Department (HOSPITAL_COMMUNITY): Payer: Medicare Other

## 2017-10-24 ENCOUNTER — Encounter (HOSPITAL_COMMUNITY): Payer: Self-pay | Admitting: Emergency Medicine

## 2017-10-24 DIAGNOSIS — I5032 Chronic diastolic (congestive) heart failure: Secondary | ICD-10-CM | POA: Insufficient documentation

## 2017-10-24 DIAGNOSIS — E039 Hypothyroidism, unspecified: Secondary | ICD-10-CM | POA: Diagnosis not present

## 2017-10-24 DIAGNOSIS — I110 Hypertensive heart disease with heart failure: Secondary | ICD-10-CM | POA: Diagnosis not present

## 2017-10-24 DIAGNOSIS — E1140 Type 2 diabetes mellitus with diabetic neuropathy, unspecified: Secondary | ICD-10-CM | POA: Diagnosis not present

## 2017-10-24 DIAGNOSIS — Z79899 Other long term (current) drug therapy: Secondary | ICD-10-CM | POA: Diagnosis not present

## 2017-10-24 DIAGNOSIS — R109 Unspecified abdominal pain: Secondary | ICD-10-CM | POA: Diagnosis not present

## 2017-10-24 DIAGNOSIS — R1032 Left lower quadrant pain: Secondary | ICD-10-CM | POA: Diagnosis not present

## 2017-10-24 DIAGNOSIS — Z87891 Personal history of nicotine dependence: Secondary | ICD-10-CM | POA: Diagnosis not present

## 2017-10-24 DIAGNOSIS — Z7901 Long term (current) use of anticoagulants: Secondary | ICD-10-CM | POA: Diagnosis not present

## 2017-10-24 DIAGNOSIS — Z7984 Long term (current) use of oral hypoglycemic drugs: Secondary | ICD-10-CM | POA: Diagnosis not present

## 2017-10-24 LAB — URINALYSIS, ROUTINE W REFLEX MICROSCOPIC
Bilirubin Urine: NEGATIVE
GLUCOSE, UA: NEGATIVE mg/dL
KETONES UR: NEGATIVE mg/dL
NITRITE: NEGATIVE
PROTEIN: NEGATIVE mg/dL
Specific Gravity, Urine: 1.016 (ref 1.005–1.030)
pH: 5 (ref 5.0–8.0)

## 2017-10-24 LAB — COMPREHENSIVE METABOLIC PANEL
ALBUMIN: 3.5 g/dL (ref 3.5–5.0)
ALT: 11 U/L (ref 0–44)
ANION GAP: 9 (ref 5–15)
AST: 19 U/L (ref 15–41)
Alkaline Phosphatase: 48 U/L (ref 38–126)
BILIRUBIN TOTAL: 0.6 mg/dL (ref 0.3–1.2)
BUN: 15 mg/dL (ref 8–23)
CALCIUM: 9.2 mg/dL (ref 8.9–10.3)
CO2: 23 mmol/L (ref 22–32)
Chloride: 107 mmol/L (ref 98–111)
Creatinine, Ser: 0.99 mg/dL (ref 0.44–1.00)
GFR, EST NON AFRICAN AMERICAN: 54 mL/min — AB (ref >60)
Glucose, Bld: 117 mg/dL — ABNORMAL HIGH (ref 70–99)
POTASSIUM: 4 mmol/L (ref 3.5–5.1)
Sodium: 139 mmol/L (ref 135–145)
TOTAL PROTEIN: 6.7 g/dL (ref 6.5–8.1)

## 2017-10-24 LAB — CBC
HCT: 43.7 % (ref 36.0–46.0)
HEMOGLOBIN: 12.8 g/dL (ref 12.0–15.0)
MCH: 26.7 pg (ref 26.0–34.0)
MCHC: 29.3 g/dL — ABNORMAL LOW (ref 30.0–36.0)
MCV: 91 fL (ref 80.0–100.0)
NRBC: 0 % (ref 0.0–0.2)
Platelets: 281 10*3/uL (ref 150–400)
RBC: 4.8 MIL/uL (ref 3.87–5.11)
RDW: 16.2 % — ABNORMAL HIGH (ref 11.5–15.5)
WBC: 8.4 10*3/uL (ref 4.0–10.5)

## 2017-10-24 LAB — PROTIME-INR
INR: 2.31
Prothrombin Time: 25 seconds — ABNORMAL HIGH (ref 11.4–15.2)

## 2017-10-24 LAB — LIPASE, BLOOD: Lipase: 35 U/L (ref 11–51)

## 2017-10-24 MED ORDER — ONDANSETRON HCL 4 MG/2ML IJ SOLN
4.0000 mg | Freq: Once | INTRAMUSCULAR | Status: AC
  Administered 2017-10-24: 4 mg via INTRAVENOUS
  Filled 2017-10-24: qty 2

## 2017-10-24 MED ORDER — SODIUM CHLORIDE 0.9 % IV SOLN
Freq: Once | INTRAVENOUS | Status: AC
  Administered 2017-10-24: 125 mL/h via INTRAVENOUS

## 2017-10-24 MED ORDER — IOHEXOL 300 MG/ML  SOLN
100.0000 mL | Freq: Once | INTRAMUSCULAR | Status: AC | PRN
  Administered 2017-10-24: 100 mL via INTRAVENOUS

## 2017-10-24 NOTE — ED Notes (Signed)
Patient verbalizes understanding of discharge instructions. Opportunity for questioning and answers were provided. Pt discharged from ED. 

## 2017-10-24 NOTE — ED Provider Notes (Signed)
[facility] EMERGENCY DEPARTMENT Provider Note   CSN: [id number] Arrival date & time: 10/24/17  0507     History   Chief Complaint Chief Complaint  Patient presents with  . Abdominal Pain    HPI Simaya Kramer is a 77 y.o. female.  77 year old female with prior medical history as detailed below presents for evaluation of abdominal pain.  Patient reports diffuse left lower abdominal pain that started around midnight.  Pain is constant and dull.  She denies vomiting.  She does report mild nausea.  She denies associated fever.  She denies chest pain, shortness of breath, back pain, or other acute complaint.  She is status post a sigmoid stricture in 2016 with a partial colectomy.  She has a colostomy bag in place.  She reports that her colostomy output has been normal.  The history is provided by the patient and medical records.  Abdominal Pain   This is a new problem. The current episode started 6 to 12 hours ago. The problem occurs constantly. The problem has not changed since onset.The pain is associated with an unknown factor. The pain is located in the LLQ. The pain is mild. Associated symptoms include nausea. Pertinent negatives include anorexia, fever, diarrhea, melena and vomiting. Nothing aggravates the symptoms. Nothing relieves the symptoms.    Past Medical History:  Diagnosis Date  . Acute massive pulmonary embolism (HCC) 07/13/2012   Massive PE w/ PEA arrest 07/13/12 >TNK >IVC filter >discharged on comadin    . Diabetes mellitus, type 2 (HCC) 07/15/2012   with peripheral neuropathy  . Hyperlipemia 07/14/2012  . Hypertension 07/14/2012  . Large bowel stricture (HCC)    s/p colectomy in 2016  . Osteoarthritis    s/p hip and knee replacements  . Thyroid disease     Patient Active Problem List   Diagnosis Date Noted  . Chronic diastolic CHF (congestive heart failure) (HCC) 03/15/2017  . Acute GI bleeding 03/15/2017  . Anticoagulated on warfarin   .  Pulmonary hypertension (HCC) 07/26/2016  . GI bleed 03/10/2016  . History of DVT in adulthood 03/10/2016  . Acute blood loss anemia 03/10/2016  . Gastrointestinal hemorrhage   . Diabetes mellitus with complication (HCC)   . Encounter for therapeutic drug monitoring 05/04/2015  . Neuropathy 02/12/2015  . Osteoarthritis 08/12/2014  . Normocytic anemia 06/08/2014  . History of pulmonary embolism 06/10/2013  . Hypothyroid 06/06/2013  . Essential hypertension 06/06/2013  . Diabetes mellitus with neurological manifestations, controlled (HCC) 06/06/2013  . Long term current use of anticoagulant therapy 07/27/2012    Past Surgical History:  Procedure Laterality Date  . COLONOSCOPY N/A 03/12/2016   Procedure: COLONOSCOPY;  Surgeon: Dorena Cookey, MD;  Location: [facility] ENDOSCOPY;  Service: Endoscopy;  Laterality: N/A;  . COLOSTOMY N/A 06/03/2014   Procedure: COLOSTOMY;  Surgeon: Harriette Bouillon, MD;  Location: [facility] OR;  Service: General;  Laterality: N/A;  . FLEXIBLE SIGMOIDOSCOPY N/A 05/30/2014   Procedure: Arnell Sieving;  Surgeon: Jeani Hawking, MD;  Location: [facility] ENDOSCOPY;  Service: Endoscopy;  Laterality: N/A;  . INSERTION OF VENA CAVA FILTER N/A 07/16/2012   Procedure: INSERTION OF VENA CAVA FILTER;  Surgeon: [name], MD;  Location: [facility] CATH LAB;  Service: Cardiovascular;  Laterality: N/A;  . KNEE ARTHROSCOPY    . PARTIAL COLECTOMY N/A 06/03/2014   Procedure: PARTIAL COLECTOMY;  Surgeon: Harriette Bouillon, MD;  Location: [facility] OR;  Service: General;  Laterality: N/A;  . REDUCTION MAMMAPLASTY Bilateral   . SP ARTHRO HIP*L*  OB History   None      Home Medications    Prior to Admission medications   Medication Sig Start Date End Date Taking? Authorizing Provider  acetaminophen (TYLENOL) 500 MG tablet Take 500 mg by mouth every 6 (six) hours as needed (pain).    [provider]  furosemide (LASIX) 20 MG tablet Take 1 tablet (20 mg total) by mouth daily. Patient taking  differently: Take 20 mg by mouth as needed. Pt States PRN 03/09/17   Kriste Basque R, DO  glucose blood (ONETOUCH VERIO) test strip Use as instructed once a day 09/19/17   Kriste Basque R, DO  glucose blood test strip Use as instructed once a day 05/26/17   [name], DO  levothyroxine (SYNTHROID, LEVOTHROID) 25 MCG tablet Take 1 tablet by mouth  daily before breakfast 09/19/17   [name], DO  lisinopril (PRINIVIL,ZESTRIL) 40 MG tablet TAKE 1 TABLET BY MOUTH ONCE DAILY 10/20/17   [name], DO  metFORMIN (GLUCOPHAGE) 1000 MG tablet TAKE ONE TABLET BY MOUTH WITH BREAKFAST AND TAKE ONE TABLET WITH DINNER 02/08/17   [name], DO  metoprolol tartrate (LOPRESSOR) 25 MG tablet Take 1 tablet (25 mg total) by mouth 2 (two) times daily. 06/09/17   [name], DO  ONETOUCH DELICA LANCETS FINE MISC USE ONCE DAILY. 05/18/17   [name], DO  polyethylene glycol (MIRALAX / GLYCOLAX) packet Take 17 g by mouth daily. Patient taking differently: Take 17 g by mouth daily as needed.  03/19/17   Zigmund Daniel., MD  potassium chloride (K-DUR) 10 MEQ tablet Take as directed--Take one tablet with each furosemide (Lasix) dose Patient taking differently: Take 10 mEq by mouth as needed. Take as directed--Take one tablet with each furosemide (Lasix) dose 03/02/17   Pricilla Riffle, MD  simvastatin (ZOCOR) 20 MG tablet Take 1 tablet (20 mg total) by mouth at bedtime. 08/08/16   [name], DO  vitamin C (ASCORBIC ACID) 500 MG tablet Take 500 mg by mouth daily.     [provider]  warfarin (COUMADIN) 5 MG tablet Take as directed by Coumadin Clinic 09/20/17   Pricilla Riffle, MD    Family History Family History  Problem Relation Age of Onset  . Breast cancer Mother     Social History Social History   Tobacco Use  . Smoking status: Former Smoker    Packs/day: 1.00    Years: 10.00    Pack years: 10.00    Types: Cigarettes    Last attempt to quit: 01/04/1968    Years since quitting: 49.8  .  Smokeless tobacco: Never Used  Substance Use Topics  . Alcohol use: No  . Drug use: No     Allergies   Augmentin [amoxicillin-pot clavulanate]; Penicillins; and Tranxene [clorazepate]   Review of Systems Review of Systems  Constitutional: Negative for fever.  Gastrointestinal: Positive for abdominal pain and nausea. Negative for anorexia, diarrhea, melena and vomiting.  All other systems reviewed and are negative.    Physical Exam Updated Vital Signs BP (!) 180/90 (BP Location: Right Arm)   Pulse 70   Temp 98.2 F (36.8 C) (Oral)   Resp 16   SpO2 100%   Physical Exam  Constitutional: She is oriented to person, place, and time. She appears well-developed and well-nourished. No distress.  HENT:  Head: Normocephalic and atraumatic.  Mouth/Throat: Oropharynx is clear and moist.  Eyes: Pupils are equal, round, and reactive  to light. Conjunctivae and EOM are normal.  Neck: Normal range of motion. Neck supple.  Cardiovascular: Normal rate, regular rhythm and normal heart sounds.  Pulmonary/Chest: Effort normal and breath sounds normal. No respiratory distress.  Abdominal: Soft. She exhibits no distension. There is tenderness in the left lower quadrant.  Colostomy bag in place in the left lower quadrant.  No gross blood in output.  Mild tenderness in the left lower quadrant and periumbilical region.  No rebound, no guarding.  Musculoskeletal: Normal range of motion. She exhibits no edema or deformity.  Neurological: She is alert and oriented to person, place, and time.  Skin: Skin is warm and dry.  Psychiatric: She has a normal mood and affect.  Nursing note and vitals reviewed.    ED Treatments / Results  Labs (all labs ordered are listed, but only abnormal results are displayed) Labs Reviewed  COMPREHENSIVE METABOLIC PANEL - Abnormal; Notable for the following components:      Result Value   Glucose, Bld 117 (*)    GFR calc non Af Amer 54 (*)    All other  components within normal limits  CBC - Abnormal; Notable for the following components:   MCHC 29.3 (*)    RDW 16.2 (*)    All other components within normal limits  URINALYSIS, ROUTINE W REFLEX MICROSCOPIC - Abnormal; Notable for the following components:   APPearance HAZY (*)    Hgb urine dipstick SMALL (*)    Leukocytes, UA LARGE (*)    Bacteria, UA RARE (*)    All other components within normal limits  LIPASE, BLOOD  PROTIME-INR    EKG None  Radiology No results found.  Procedures Procedures (including critical care time)  Medications Ordered in ED Medications  0.9 %  sodium chloride infusion (has no administration in time range)  ondansetron (ZOFRAN) injection 4 mg (has no administration in time range)     Initial Impression / Assessment and Plan / ED Course  I have reviewed the triage vital signs and the nursing notes.  Pertinent labs & imaging results that were available during my care of the patient were reviewed by me and considered in my medical decision making (see chart for details).     MDM  Screen complete  Patient is presenting for evaluation abdominal pain.  Her symptoms improved significantly during the course of her evaluation.  Screening labs are without significant abnormality.  CT abdomen pelvis does not suggest acute pathology.  When her ED work-up she desires discharge home.  She is aware of the need for close follow-up.  Strict return precautions given and understood.   Final Clinical Impressions(s) / ED Diagnoses   Final diagnoses:  Abdominal pain, unspecified abdominal location    ED Discharge Orders    None       Wynetta Fines, MD 10/24/17 1007

## 2017-10-24 NOTE — ED Triage Notes (Signed)
Pt from home. Diffuse lower abdominal pain since last night. Nausea with no vomiting. No urinary symptoms. No fever, chills. Does endorse headache.

## 2017-10-24 NOTE — Discharge Instructions (Addendum)
Please return for any problem. Follow up with your regular physician in 2-3 days.  Return to the ED immediately if you have increased abdominal pain, fever, nausea, vomiting, or other emergency.  Of note, your INR today was 2.3.  Please communicate this information to the Coumadin clinic.

## 2017-10-25 LAB — URINE CULTURE

## 2017-10-25 NOTE — Progress Notes (Signed)
HPI:  Using dictation device. Unfortunately this device frequently misinterprets words/phrases.  Acute visit for LLQ abd pain: -s/p extensive eval in the emergency room 2 days ago with CT, labs, urine -pmh partial colectomy with colostomy , GI bleed - sees GI, Dr. Dulce Sellar     -urine in ER with large leuks, multiple species and recollection advised on report  -Today reports feeling much better -No symptoms, denies abdominal pain, changes in bowel, hematochezia, melena, dysuria, fevers, nausea, vomiting or malaise -PMH Hypothyroidism, DM, Hx PE, HTN, HLD, Chronic pain              ROS: See pertinent positives and negatives per HPI.  Past Medical History:  Diagnosis Date  . Acute massive pulmonary embolism (HCC) 07/13/2012   Massive PE w/ PEA arrest 07/13/12 >TNK >IVC filter >discharged on comadin    . Diabetes mellitus, type 2 (HCC) 07/15/2012   with peripheral neuropathy  . Hyperlipemia 07/14/2012  . Hypertension 07/14/2012  . Large bowel stricture (HCC)    s/p colectomy in 2016  . Osteoarthritis    s/p hip and knee replacements  . Thyroid disease     Past Surgical History:  Procedure Laterality Date  . COLONOSCOPY N/A 03/12/2016   Procedure: COLONOSCOPY;  Surgeon: Dorena Cookey, MD;  Location: [facility] ENDOSCOPY;  Service: Endoscopy;  Laterality: N/A;  . COLOSTOMY N/A 06/03/2014   Procedure: COLOSTOMY;  Surgeon: Harriette Bouillon, MD;  Location: [facility] OR;  Service: General;  Laterality: N/A;  . FLEXIBLE SIGMOIDOSCOPY N/A 05/30/2014   Procedure: Arnell Sieving;  Surgeon: Jeani Hawking, MD;  Location: [facility] ENDOSCOPY;  Service: Endoscopy;  Laterality: N/A;  . INSERTION OF VENA CAVA FILTER N/A 07/16/2012   Procedure: INSERTION OF VENA CAVA FILTER;  Surgeon: [name], MD;  Location: [facility] CATH LAB;  Service: Cardiovascular;  Laterality: N/A;  . KNEE ARTHROSCOPY    . PARTIAL COLECTOMY N/A 06/03/2014   Procedure: PARTIAL COLECTOMY;  Surgeon: Harriette Bouillon, MD;  Location: [facility] OR;  Service:  General;  Laterality: N/A;  . REDUCTION MAMMAPLASTY Bilateral   . SP ARTHRO HIP*L*      Family History  Problem Relation Age of Onset  . Breast cancer Mother     SOCIAL HX: see hpi   Current Outpatient Medications:  .  acetaminophen (TYLENOL) 500 MG tablet, Take 500 mg by mouth every 6 (six) hours as needed for mild pain. , Disp: , Rfl:  .  furosemide (LASIX) 20 MG tablet, Take 1 tablet (20 mg total) by mouth daily. (Patient taking differently: Take 20 mg by mouth as needed. ), Disp: 30 tablet, Rfl: 3 .  glucose blood (ONETOUCH VERIO) test strip, Use as instructed once a day, Disp: 100 each, Rfl: 5 .  glucose blood test strip, Use as instructed once a day, Disp: 100 each, Rfl: 3 .  levothyroxine (SYNTHROID, LEVOTHROID) 25 MCG tablet, Take 1 tablet by mouth  daily before breakfast (Patient taking differently: Take 25 mcg by mouth daily before breakfast. ), Disp: 90 tablet, Rfl: 1 .  lisinopril (PRINIVIL,ZESTRIL) 40 MG tablet, TAKE 1 TABLET BY MOUTH ONCE DAILY (Patient taking differently: Take 40 mg by mouth daily. ), Disp: 90 tablet, Rfl: 1 .  metFORMIN (GLUCOPHAGE) 1000 MG tablet, TAKE ONE TABLET BY MOUTH WITH BREAKFAST AND TAKE ONE TABLET WITH DINNER (Patient taking differently: Take 1,000 mg by mouth 2 (two) times daily. ), Disp: 180 tablet, Rfl: 1 .  metoprolol tartrate (LOPRESSOR) 25 MG tablet, Take 1 tablet (25 mg total) by mouth  2 (two) times daily., Disp: 180 tablet, Rfl: 1 .  ONETOUCH DELICA LANCETS FINE MISC, USE ONCE DAILY., Disp: 100 each, Rfl: 3 .  polyethylene glycol (MIRALAX / GLYCOLAX) packet, Take 17 g by mouth daily. (Patient taking differently: Take 17 g by mouth daily as needed for mild constipation. ), Disp: 14 each, Rfl: 0 .  potassium chloride (K-DUR) 10 MEQ tablet, Take as directed--Take one tablet with each furosemide (Lasix) dose, Disp: 30 tablet, Rfl: 6 .  simvastatin (ZOCOR) 20 MG tablet, Take 1 tablet (20 mg total) by mouth at bedtime., Disp: 90 tablet, Rfl: 3 .   vitamin C (ASCORBIC ACID) 500 MG tablet, Take 500 mg by mouth daily. , Disp: , Rfl:  .  warfarin (COUMADIN) 5 MG tablet, Take as directed by Coumadin Clinic (Patient taking differently: Take 5 mg by mouth See admin instructions. Take one tablet (5MG ) by mouth on Sun, Tues, and Thursdays. All other days take 1/2 tablet (2.5MG )), Disp: 90 tablet, Rfl: 1  EXAM:  Vitals:   10/26/17 0931  BP: 118/80  Pulse: 73  Temp: 97.7 F (36.5 C)    Body mass index is 26.2 kg/m.  GENERAL: vitals reviewed and listed above, alert, oriented, appears well hydrated and in no acute distress  HEENT: atraumatic, conjunttiva clear, no obvious abnormalities on inspection of external nose and ears  NECK: no obvious masses on inspection  LUNGS: clear to auscultation bilaterally, no wheezes, rales or rhonchi, good air movement  CV: HRRR, no peripheral edema  MS: moves all extremities without noticeable abnormality  ABD: colostomy, BS+, soft, NTTP  PSYCH: pleasant and cooperative, no obvious depression or anxiety  ASSESSMENT AND PLAN:  Discussed the following assessment and plan: More than 50% of over 25 minutes spent in total in caring for this patient was spent face-to-face with the patient, counseling and/or coordinating care.    Abdominal pain, unspecified abdominal location  Abnormal result on screening urine test - Plan: Culture, Urine  Essential hypertension  Hyperlipidemia, unspecified hyperlipidemia type  Hyperlipidemia associated with type 2 diabetes mellitus (HCC) - Plan: simvastatin (ZOCOR) 20 MG tablet  -When she is feeling better, seems all symptoms have resolved -Reviewed results of test from the emergency department, she is feeling fine without any dysuria, but would like to recheck the urine culture -Had my assistant check on her lisinopril refills -Send in refills on her cholesterol medication ports almost being out -Follow-up as scheduled in December, sooner as needed -Patient  advised to return or notify a doctor immediately if symptoms worsen or persist or new concerns arise.  Patient Instructions  BEFORE YOU LEAVE: -urine culture -follow up: as scheduled  Our office sent lisinopril refills earlier this month. Please contact my assistant, Ronnald Collum, if the pharmacy does not give you the refills we ordered.  I sent refills of your cholesterol medication today.  We have ordered labs or studies at this visit. It can take up to 1-2 weeks for results and processing. IF results require follow up or explanation, we will call you with instructions. Clinically stable results will be released to your [facility]. If you have not heard from [place] or cannot find your results in [facility] in 2 weeks please contact our office at [phone].  If you are not yet signed up for [facility], please consider signing up.  I hope you are feeling better soon! Seek care promptly if your symptoms worsen, new concerns arise or you are not improving with treatment.  Lyrique Hakim R Eliga Arvie, DO   

## 2017-10-26 ENCOUNTER — Encounter: Payer: Self-pay | Admitting: Family Medicine

## 2017-10-26 ENCOUNTER — Ambulatory Visit (INDEPENDENT_AMBULATORY_CARE_PROVIDER_SITE_OTHER): Payer: Medicare Other | Admitting: Family Medicine

## 2017-10-26 VITALS — BP 118/80 | HR 73 | Temp 97.7°F | Ht 67.0 in | Wt 167.3 lb

## 2017-10-26 DIAGNOSIS — E785 Hyperlipidemia, unspecified: Secondary | ICD-10-CM

## 2017-10-26 DIAGNOSIS — R8290 Unspecified abnormal findings in urine: Secondary | ICD-10-CM | POA: Diagnosis not present

## 2017-10-26 DIAGNOSIS — R109 Unspecified abdominal pain: Secondary | ICD-10-CM | POA: Diagnosis not present

## 2017-10-26 DIAGNOSIS — E1169 Type 2 diabetes mellitus with other specified complication: Secondary | ICD-10-CM

## 2017-10-26 DIAGNOSIS — I10 Essential (primary) hypertension: Secondary | ICD-10-CM | POA: Diagnosis not present

## 2017-10-26 MED ORDER — SIMVASTATIN 20 MG PO TABS: 20.0000 mg | ORAL_TABLET | Freq: Every day | ORAL | 3 refills | Status: DC

## 2017-10-26 NOTE — Patient Instructions (Signed)
BEFORE YOU LEAVE: -urine culture -follow up: as scheduled  Our office sent lisinopril refills earlier this month. Please contact my assistant, Ronnald Collum, if the pharmacy does not give you the refills we ordered.  I sent refills of your cholesterol medication today.  We have ordered labs or studies at this visit. It can take up to 1-2 weeks for results and processing. IF results require follow up or explanation, we will call you with instructions. Clinically stable results will be released to your [facility]. If you have not heard from [place] or cannot find your results in [facility] in 2 weeks please contact our office at [phone].  If you are not yet signed up for [facility], please consider signing up.  I hope you are feeling better soon! Seek care promptly if your symptoms worsen, new concerns arise or you are not improving with treatment.

## 2017-10-27 LAB — URINE CULTURE
MICRO NUMBER: 91280955
SPECIMEN QUALITY: ADEQUATE

## 2017-10-31 ENCOUNTER — Ambulatory Visit
Admission: RE | Admit: 2017-10-31 | Discharge: 2017-10-31 | Disposition: A | Discharge status: Home / Self Care | Payer: Medicare Other | Source: Ambulatory Visit | Attending: Family Medicine | Admitting: Family Medicine

## 2017-10-31 ENCOUNTER — Ambulatory Visit: Payer: Medicare Other | Admitting: Family Medicine

## 2017-10-31 DIAGNOSIS — Z1231 Encounter for screening mammogram for malignant neoplasm of breast: Secondary | ICD-10-CM | POA: Diagnosis not present

## 2017-11-08 ENCOUNTER — Other Ambulatory Visit: Payer: Self-pay

## 2017-11-08 NOTE — Patient Outreach (Signed)
[facility]) Care Management  11/08/2017  Hannah [name] June 03, 1940 [id number]   Medication Adherence call to [name] spoke with patient she did not want to engage, she wants [employer] to get in contact with her doctor for information on her medication, patient is due on Simvastatin 20 mg under [facility] Ins.   Hannah [name] CPhT Pharmacy Technician [facility] Care Management Direct Dial [phone]  Fax [phone] [name]

## 2017-11-15 ENCOUNTER — Ambulatory Visit: Payer: Medicare Other | Admitting: *Deleted

## 2017-11-15 DIAGNOSIS — Z5181 Encounter for therapeutic drug level monitoring: Secondary | ICD-10-CM

## 2017-11-15 DIAGNOSIS — I2699 Other pulmonary embolism without acute cor pulmonale: Secondary | ICD-10-CM

## 2017-11-15 DIAGNOSIS — Z7901 Long term (current) use of anticoagulants: Secondary | ICD-10-CM | POA: Diagnosis not present

## 2017-11-15 LAB — POCT INR: INR: 3.3 — AB (ref 2.0–3.0)

## 2017-11-15 NOTE — Patient Instructions (Signed)
Description   Continue on same dosage 1/2 tablet daily except 1 tablet on Sundays, Tuesdays and Thursdays. Recheck in 5 weeks.  Call with any new meds or procedures [phone]

## 2017-11-22 ENCOUNTER — Other Ambulatory Visit: Payer: Self-pay | Admitting: Family Medicine

## 2017-11-22 MED ORDER — METFORMIN HCL 1000 MG PO TABS: ORAL_TABLET | ORAL | 1 refills | Status: DC

## 2017-11-22 NOTE — Telephone Encounter (Signed)
Copied from CRM [id number]. Topic: Quick Communication - Rx Refill/Question >> Nov 22, 2017  9:58 AM Tamela Oddi wrote: Medication: metFORMIN (GLUCOPHAGE) 1000 MG tablet  Patient called to request a refill for the above medication  Preferred Pharmacy (with phone number or street name): [facility] [address] [place], [place] - [address] [phone] (Phone) [phone] (Fax)

## 2017-12-07 ENCOUNTER — Emergency Department (HOSPITAL_COMMUNITY): Payer: Medicare Other

## 2017-12-07 ENCOUNTER — Observation Stay (HOSPITAL_COMMUNITY)
Admission: EM | Admit: 2017-12-07 | Discharge: 2017-12-08 | Disposition: A | Discharge status: Home / Self Care | Payer: Medicare Other | Attending: Internal Medicine | Admitting: Internal Medicine

## 2017-12-07 ENCOUNTER — Encounter (HOSPITAL_COMMUNITY): Payer: Self-pay | Admitting: Emergency Medicine

## 2017-12-07 ENCOUNTER — Other Ambulatory Visit: Payer: Self-pay

## 2017-12-07 DIAGNOSIS — E785 Hyperlipidemia, unspecified: Secondary | ICD-10-CM | POA: Diagnosis not present

## 2017-12-07 DIAGNOSIS — I700 Atherosclerosis of aorta: Secondary | ICD-10-CM | POA: Insufficient documentation

## 2017-12-07 DIAGNOSIS — M25551 Pain in right hip: Secondary | ICD-10-CM | POA: Diagnosis not present

## 2017-12-07 DIAGNOSIS — N390 Urinary tract infection, site not specified: Secondary | ICD-10-CM | POA: Diagnosis not present

## 2017-12-07 DIAGNOSIS — I110 Hypertensive heart disease with heart failure: Secondary | ICD-10-CM | POA: Diagnosis not present

## 2017-12-07 DIAGNOSIS — E039 Hypothyroidism, unspecified: Secondary | ICD-10-CM | POA: Diagnosis not present

## 2017-12-07 DIAGNOSIS — N179 Acute kidney failure, unspecified: Secondary | ICD-10-CM | POA: Diagnosis not present

## 2017-12-07 DIAGNOSIS — Z79890 Hormone replacement therapy (postmenopausal): Secondary | ICD-10-CM | POA: Insufficient documentation

## 2017-12-07 DIAGNOSIS — Z7984 Long term (current) use of oral hypoglycemic drugs: Secondary | ICD-10-CM | POA: Insufficient documentation

## 2017-12-07 DIAGNOSIS — Z96642 Presence of left artificial hip joint: Secondary | ICD-10-CM | POA: Insufficient documentation

## 2017-12-07 DIAGNOSIS — M1990 Unspecified osteoarthritis, unspecified site: Secondary | ICD-10-CM | POA: Diagnosis present

## 2017-12-07 DIAGNOSIS — S0990XA Unspecified injury of head, initial encounter: Secondary | ICD-10-CM | POA: Diagnosis not present

## 2017-12-07 DIAGNOSIS — Z96659 Presence of unspecified artificial knee joint: Secondary | ICD-10-CM | POA: Insufficient documentation

## 2017-12-07 DIAGNOSIS — Z86711 Personal history of pulmonary embolism: Secondary | ICD-10-CM | POA: Diagnosis not present

## 2017-12-07 DIAGNOSIS — Z7901 Long term (current) use of anticoagulants: Secondary | ICD-10-CM | POA: Diagnosis not present

## 2017-12-07 DIAGNOSIS — S79912A Unspecified injury of left hip, initial encounter: Secondary | ICD-10-CM | POA: Diagnosis not present

## 2017-12-07 DIAGNOSIS — Y92009 Unspecified place in unspecified non-institutional (private) residence as the place of occurrence of the external cause: Secondary | ICD-10-CM | POA: Diagnosis not present

## 2017-12-07 DIAGNOSIS — S299XXA Unspecified injury of thorax, initial encounter: Secondary | ICD-10-CM | POA: Diagnosis not present

## 2017-12-07 DIAGNOSIS — R569 Unspecified convulsions: Secondary | ICD-10-CM | POA: Insufficient documentation

## 2017-12-07 DIAGNOSIS — I160 Hypertensive urgency: Secondary | ICD-10-CM | POA: Diagnosis present

## 2017-12-07 DIAGNOSIS — M25552 Pain in left hip: Secondary | ICD-10-CM | POA: Insufficient documentation

## 2017-12-07 DIAGNOSIS — I5020 Unspecified systolic (congestive) heart failure: Secondary | ICD-10-CM | POA: Diagnosis present

## 2017-12-07 DIAGNOSIS — W19XXXA Unspecified fall, initial encounter: Secondary | ICD-10-CM | POA: Diagnosis not present

## 2017-12-07 DIAGNOSIS — E1140 Type 2 diabetes mellitus with diabetic neuropathy, unspecified: Secondary | ICD-10-CM | POA: Insufficient documentation

## 2017-12-07 DIAGNOSIS — Z79899 Other long term (current) drug therapy: Secondary | ICD-10-CM | POA: Insufficient documentation

## 2017-12-07 DIAGNOSIS — Z9049 Acquired absence of other specified parts of digestive tract: Secondary | ICD-10-CM | POA: Insufficient documentation

## 2017-12-07 DIAGNOSIS — Z87891 Personal history of nicotine dependence: Secondary | ICD-10-CM | POA: Diagnosis not present

## 2017-12-07 DIAGNOSIS — Z933 Colostomy status: Secondary | ICD-10-CM | POA: Insufficient documentation

## 2017-12-07 DIAGNOSIS — S3993XA Unspecified injury of pelvis, initial encounter: Secondary | ICD-10-CM | POA: Diagnosis not present

## 2017-12-07 DIAGNOSIS — I10 Essential (primary) hypertension: Secondary | ICD-10-CM | POA: Insufficient documentation

## 2017-12-07 DIAGNOSIS — S79911A Unspecified injury of right hip, initial encounter: Secondary | ICD-10-CM | POA: Diagnosis not present

## 2017-12-07 DIAGNOSIS — E1149 Type 2 diabetes mellitus with other diabetic neurological complication: Secondary | ICD-10-CM | POA: Diagnosis present

## 2017-12-07 DIAGNOSIS — I5032 Chronic diastolic (congestive) heart failure: Secondary | ICD-10-CM | POA: Insufficient documentation

## 2017-12-07 LAB — CBC WITH DIFFERENTIAL/PLATELET
Abs Immature Granulocytes: 0.08 10*3/uL — ABNORMAL HIGH (ref 0.00–0.07)
Basophils Absolute: 0 10*3/uL (ref 0.0–0.1)
Basophils Relative: 0 %
Eosinophils Absolute: 0.1 10*3/uL (ref 0.0–0.5)
Eosinophils Relative: 0 %
HCT: 48.9 % — ABNORMAL HIGH (ref 36.0–46.0)
Hemoglobin: 14.3 g/dL (ref 12.0–15.0)
Immature Granulocytes: 0 %
Lymphocytes Relative: 14 %
Lymphs Abs: 2.7 10*3/uL (ref 0.7–4.0)
MCH: 26.9 pg (ref 26.0–34.0)
MCHC: 29.2 g/dL — ABNORMAL LOW (ref 30.0–36.0)
MCV: 91.9 fL (ref 80.0–100.0)
Monocytes Absolute: 0.8 10*3/uL (ref 0.1–1.0)
Monocytes Relative: 4 %
Neutro Abs: 15.1 10*3/uL — ABNORMAL HIGH (ref 1.7–7.7)
Neutrophils Relative %: 82 %
Platelets: 334 10*3/uL (ref 150–400)
RBC: 5.32 MIL/uL — ABNORMAL HIGH (ref 3.87–5.11)
RDW: 15.3 % (ref 11.5–15.5)
WBC: 18.8 10*3/uL — ABNORMAL HIGH (ref 4.0–10.5)
nRBC: 0 % (ref 0.0–0.2)

## 2017-12-07 LAB — BASIC METABOLIC PANEL
Anion gap: 13 (ref 5–15)
BUN: 19 mg/dL (ref 8–23)
CO2: 20 mmol/L — ABNORMAL LOW (ref 22–32)
Calcium: 9.7 mg/dL (ref 8.9–10.3)
Chloride: 105 mmol/L (ref 98–111)
Creatinine, Ser: 1.24 mg/dL — ABNORMAL HIGH (ref 0.44–1.00)
GFR calc Af Amer: 49 mL/min — ABNORMAL LOW (ref >60)
GFR calc non Af Amer: 42 mL/min — ABNORMAL LOW (ref >60)
Glucose, Bld: 134 mg/dL — ABNORMAL HIGH (ref 70–99)
Potassium: 4.1 mmol/L (ref 3.5–5.1)
Sodium: 138 mmol/L (ref 135–145)

## 2017-12-07 LAB — URINALYSIS, ROUTINE W REFLEX MICROSCOPIC
Bilirubin Urine: NEGATIVE
Glucose, UA: NEGATIVE mg/dL
Ketones, ur: 5 mg/dL — AB
Nitrite: NEGATIVE
Protein, ur: NEGATIVE mg/dL
Specific Gravity, Urine: 1.015 (ref 1.005–1.030)
pH: 5 (ref 5.0–8.0)

## 2017-12-07 LAB — PROTIME-INR
INR: 3.07
Prothrombin Time: 31.2 seconds — ABNORMAL HIGH (ref 11.4–15.2)

## 2017-12-07 LAB — GLUCOSE, CAPILLARY: Glucose-Capillary: 115 mg/dL — ABNORMAL HIGH (ref 70–99)

## 2017-12-07 MED ORDER — INSULIN ASPART 100 UNIT/ML ~~LOC~~ SOLN: 0.0000 [IU] | Freq: Every day | SUBCUTANEOUS | Status: DC

## 2017-12-07 MED ORDER — SIMVASTATIN 20 MG PO TABS
20.0000 mg | ORAL_TABLET | Freq: Every day | ORAL | Status: DC
  Administered 2017-12-07: 20 mg via ORAL
  Filled 2017-12-07: qty 1

## 2017-12-07 MED ORDER — INSULIN ASPART 100 UNIT/ML ~~LOC~~ SOLN
0.0000 [IU] | Freq: Three times a day (TID) | SUBCUTANEOUS | Status: DC
  Administered 2017-12-08: 9 [IU] via SUBCUTANEOUS

## 2017-12-07 MED ORDER — SODIUM CHLORIDE 0.9 % IV SOLN
1.0000 g | Freq: Once | INTRAVENOUS | Status: AC
  Administered 2017-12-07: 1 g via INTRAVENOUS
  Filled 2017-12-07: qty 10

## 2017-12-07 MED ORDER — HYDRALAZINE HCL 20 MG/ML IJ SOLN: 10.0000 mg | Freq: Four times a day (QID) | INTRAMUSCULAR | Status: DC | PRN

## 2017-12-07 MED ORDER — SODIUM CHLORIDE 0.9 % IV SOLN
INTRAVENOUS | Status: DC
  Administered 2017-12-07 – 2017-12-08 (×2): via INTRAVENOUS

## 2017-12-07 MED ORDER — WARFARIN SODIUM 2.5 MG PO TABS: 2.5000 mg | ORAL_TABLET | ORAL | Status: DC

## 2017-12-07 MED ORDER — SODIUM CHLORIDE 0.9 % IV BOLUS
500.0000 mL | Freq: Once | INTRAVENOUS | Status: AC
  Administered 2017-12-07: 500 mL via INTRAVENOUS

## 2017-12-07 MED ORDER — WARFARIN SODIUM 5 MG PO TABS
5.0000 mg | ORAL_TABLET | ORAL | Status: DC
  Administered 2017-12-07: 5 mg via ORAL
  Filled 2017-12-07 (×2): qty 1

## 2017-12-07 MED ORDER — SODIUM CHLORIDE 0.9 % IV SOLN
1.0000 g | INTRAVENOUS | Status: DC
  Filled 2017-12-07: qty 10

## 2017-12-07 MED ORDER — METOPROLOL TARTRATE 25 MG PO TABS
25.0000 mg | ORAL_TABLET | Freq: Two times a day (BID) | ORAL | Status: DC
  Administered 2017-12-07 – 2017-12-08 (×2): 25 mg via ORAL
  Filled 2017-12-07 (×2): qty 1

## 2017-12-07 MED ORDER — WARFARIN - PHARMACIST DOSING INPATIENT: Freq: Every day | Status: DC

## 2017-12-07 MED ORDER — LEVOTHYROXINE SODIUM 25 MCG PO TABS
25.0000 ug | ORAL_TABLET | Freq: Every day | ORAL | Status: DC
  Administered 2017-12-08: 25 ug via ORAL
  Filled 2017-12-07: qty 1

## 2017-12-07 MED ORDER — ACETAMINOPHEN 500 MG PO TABS
1000.0000 mg | ORAL_TABLET | Freq: Once | ORAL | Status: AC
  Administered 2017-12-07: 1000 mg via ORAL
  Filled 2017-12-07: qty 2

## 2017-12-07 NOTE — ED Triage Notes (Signed)
Pt reports walking from her kitchen to her living room and fell, pt reports she doesn't remember what she landed on but denies LOC, reports bilateral hip pain. A&O x4, pt able to stand from wheelchair and walk to bed. Pt taking warfarin.

## 2017-12-07 NOTE — Progress Notes (Signed)
ANTICOAGULATION CONSULT NOTE - Initial Consult  Pharmacy Consult for Coumadin Indication: h/o DVT/PE  Allergies  Allergen Reactions  . Augmentin [Amoxicillin-Pot Clavulanate] Other (See Comments)    Severe vaginal itching  . Tranxene [Clorazepate] Itching  . Penicillins Itching and Rash    Has patient had a PCN reaction causing immediate rash, facial/tongue/throat swelling, SOB or lightheadedness with hypotension: Yes Has patient had a PCN reaction causing severe rash involving mucus membranes or skin necrosis: No Has patient had a PCN reaction that required hospitalization: No Has patient had a PCN reaction occurring within the last 10 years: No If all of the above answers are "NO", then may proceed with Cephalosporin use.     Patient Measurements: Height: 5\' 7"  (170.2 cm) Weight: 172 lb (78 kg) IBW/kg (Calculated) : 61.6  Vital Signs: Temp: 97.6 F (36.4 C) (12/05 1202) Temp Source: Oral (12/05 1202) BP: 158/61 (12/05 1648) Pulse Rate: 91 (12/05 1648)  Labs: Recent Labs    12/07/17 1210  HGB 14.3  HCT 48.9*  PLT 334  LABPROT 31.2*  INR 3.07  CREATININE 1.24*    Estimated Creatinine Clearance: 41.6 mL/min (A) (by C-G formula based on SCr of 1.24 mg/dL (H)).   Medical History: Past Medical History:  Diagnosis Date  . Acute massive pulmonary embolism (HCC) 07/13/2012   Massive PE w/ PEA arrest 07/13/12 >TNK >IVC filter >discharged on comadin    . Diabetes mellitus, type 2 (HCC) 07/15/2012   with peripheral neuropathy  . Hyperlipemia 07/14/2012  . Hypertension 07/14/2012  . Large bowel stricture (HCC)    s/p colectomy in 2016  . Osteoarthritis    s/p hip and knee replacements  . Thyroid disease     Assessment: CC/HPI: Fall  PMH: 2014 Massive PE w/ PEA arrest 07/13/12 >TNK >IVC filter ,  DM, HTN, hypothyroid,  history of sigmoid stricture status post partial colectomy and colostomy,  GI bleed , HLD, OA  Anticoag: Coumadin PTA for h/o PE. Hgb 14.3 WNL. -  Coumadin dose PTA 5mg  TThSun, and 2.5mg  MWFSat. Admit INR 3.07  Goal of Therapy:  INR 2-3 Monitor platelets by anticoagulation protocol: Yes   Plan:  Continue home Coumadin regimen Daily INR   Kyanna Mahrt S. Merilynn Finland, PharmD, BCPS Clinical Staff Pharmacist Misty Stanley Stillinger 12/07/2017,4:57 PM

## 2017-12-07 NOTE — H&P (Signed)
History and Physical    Hannah [name] ZOX:[id number] DOB: [birth year] DOA: 12/07/2017  PCP: [name], DO   Patient coming from: Home    Chief Complaint: Fall  HPI: Hannah [name] is a 77 y.o. female with medical history significant of PE on Coumadin, Diabetes mellitus, hypertension, hypothyroidism, history of sigmoid stricture status post partial colectomy and colostomy,  GI bleed who presented from home today to the emergency department for the evaluation of fall.  Patient was apparently all right till this morning.  She was walking from her kitchen to her living room when all of a sudden she fell.  She does not remember what happened.  She denies any lightheadedness, dizziness, palpitations or funny for sensation of the chest.  She did not tip on anything.  No report of loss of consciousness.  No jerking movements of the body.  She complains of bilateral hip pain after the fall.  Patient usually walks with the help of cane. Patient seen and examined the bedside in the emergency department.  She is hemodynamically stable.  She denies any chest pain, shortness of breath, abdominal pain, nausea, vomiting, urinary symptoms, neck or back pain, melena or hematochezia.  ED Course: Imagings done after presentation did not show any fracture dislocation.  She remained hemodynamically stable.  UA done in the emergency department was suggestive of UTI so she was started on antibiotics.  Also found to be in mild acute kidney injury.  Found to have leukocytosis.    Past Medical History:  Diagnosis Date  . Acute massive pulmonary embolism (HCC) 07/13/2012   Massive PE w/ PEA arrest 07/13/12 >TNK >IVC filter >discharged on comadin    . Diabetes mellitus, type 2 (HCC) 07/15/2012   with peripheral neuropathy  . Hyperlipemia 07/14/2012  . Hypertension 07/14/2012  . Large bowel stricture (HCC)    s/p colectomy in 2016  . Osteoarthritis    s/p hip and knee replacements  . Thyroid disease     Past Surgical  History:  Procedure Laterality Date  . COLONOSCOPY N/A 03/12/2016   Procedure: COLONOSCOPY;  Surgeon: Dorena Cookey, MD;  Location: [facility] ENDOSCOPY;  Service: Endoscopy;  Laterality: N/A;  . COLOSTOMY N/A 06/03/2014   Procedure: COLOSTOMY;  Surgeon: Harriette Bouillon, MD;  Location: [facility] OR;  Service: General;  Laterality: N/A;  . FLEXIBLE SIGMOIDOSCOPY N/A 05/30/2014   Procedure: Arnell Sieving;  Surgeon: Jeani Hawking, MD;  Location: [facility] ENDOSCOPY;  Service: Endoscopy;  Laterality: N/A;  . INSERTION OF VENA CAVA FILTER N/A 07/16/2012   Procedure: INSERTION OF VENA CAVA FILTER;  Surgeon: [name], MD;  Location: [facility] CATH LAB;  Service: Cardiovascular;  Laterality: N/A;  . KNEE ARTHROSCOPY    . PARTIAL COLECTOMY N/A 06/03/2014   Procedure: PARTIAL COLECTOMY;  Surgeon: Harriette Bouillon, MD;  Location: [facility] OR;  Service: General;  Laterality: N/A;  . REDUCTION MAMMAPLASTY Bilateral   . SP ARTHRO HIP*L*       reports that she quit smoking about 49 years ago. Her smoking use included cigarettes. She has a 10.00 pack-year smoking history. She has never used smokeless tobacco. She reports that she does not drink alcohol or use drugs.  Allergies  Allergen Reactions  . Augmentin [Amoxicillin-Pot Clavulanate] Other (See Comments)    Severe vaginal itching  . Tranxene [Clorazepate] Itching  . Penicillins Itching and Rash    Has patient had a PCN reaction causing immediate rash, facial/tongue/throat swelling, SOB or lightheadedness with hypotension: Yes Has patient had a PCN reaction causing  severe rash involving mucus membranes or skin necrosis: No Has patient had a PCN reaction that required hospitalization: No Has patient had a PCN reaction occurring within the last 10 years: No If all of the above answers are "NO", then may proceed with Cephalosporin use.     Family History  Problem Relation Age of Onset  . Breast cancer Mother      Prior to Admission medications   Medication Sig Start Date  End Date Taking? Authorizing Provider  acetaminophen (TYLENOL) 500 MG tablet Take 500 mg by mouth every 6 (six) hours as needed for mild pain.     [provider]  furosemide (LASIX) 20 MG tablet Take 1 tablet (20 mg total) by mouth daily. Patient taking differently: Take 20 mg by mouth as needed.  03/09/17   [name], DO  glucose blood (ONETOUCH VERIO) test strip Use as instructed once a day 09/19/17   Kriste Basque R, DO  glucose blood test strip Use as instructed once a day 05/26/17   [name], DO  levothyroxine (SYNTHROID, LEVOTHROID) 25 MCG tablet Take 1 tablet by mouth  daily before breakfast Patient taking differently: Take 25 mcg by mouth daily before breakfast.  09/19/17   [name], DO  lisinopril (PRINIVIL,ZESTRIL) 40 MG tablet TAKE 1 TABLET BY MOUTH ONCE DAILY Patient taking differently: Take 40 mg by mouth daily.  10/20/17   [name], DO  metFORMIN (GLUCOPHAGE) 1000 MG tablet TAKE ONE TABLET BY MOUTH WITH BREAKFAST AND TAKE ONE TABLET WITH DINNER 11/22/17   [name], DO  metoprolol tartrate (LOPRESSOR) 25 MG tablet Take 1 tablet (25 mg total) by mouth 2 (two) times daily. 06/09/17   [name], DO  ONETOUCH DELICA LANCETS FINE MISC USE ONCE DAILY. 05/18/17   [name], DO  polyethylene glycol (MIRALAX / GLYCOLAX) packet Take 17 g by mouth daily. Patient taking differently: Take 17 g by mouth daily as needed for mild constipation.  03/19/17   Zigmund Daniel., MD  potassium chloride (K-DUR) 10 MEQ tablet Take as directed--Take one tablet with each furosemide (Lasix) dose 03/02/17   Pricilla Riffle, MD  simvastatin (ZOCOR) 20 MG tablet Take 1 tablet (20 mg total) by mouth at bedtime. 10/26/17   [name], DO  vitamin C (ASCORBIC ACID) 500 MG tablet Take 500 mg by mouth daily.     [provider]  warfarin (COUMADIN) 5 MG tablet Take as directed by [facility] Patient taking differently: Take 5 mg by mouth See admin instructions. Take one  tablet (5MG ) by mouth on Sun, Tues, and Thursdays. All other days take 1/2 tablet (2.5MG ) 09/20/17   Pricilla Riffle, MD    Physical Exam: Vitals:   12/07/17 1203 12/07/17 1206 12/07/17 1458 12/07/17 1537  BP: (!) 173/86  (!) 185/90 (!) 189/75  Pulse: 87  83 82  Resp:   14 16  Temp:      TempSrc:      SpO2: 98%  100% 97%  Weight:  78 kg    Height:  5\' 7"  (1.702 m)      Constitutional: NAD, calm, comfortable Vitals:   12/07/17 1203 12/07/17 1206 12/07/17 1458 12/07/17 1537  BP: (!) 173/86  (!) 185/90 (!) 189/75  Pulse: 87  83 82  Resp:   14 16  Temp:      TempSrc:      SpO2: 98%  100% 97%  Weight:  78  kg    Height:  5\' 7"  (1.702 m)     Eyes: PERRL, lids and conjunctivae normal ENMT: Mucous membranes are moist. Posterior pharynx clear of any exudate or lesions.Normal dentition.  Neck: normal, supple, no masses, no thyromegaly Respiratory: clear to auscultation bilaterally, no wheezing, no crackles. Normal respiratory effort. No accessory muscle use.  Cardiovascular: Regular rate and rhythm, no murmurs / rubs / gallops. No extremity edema. 2+ pedal pulses. No carotid bruits.  Abdomen: no tenderness, no masses palpated. No hepatosplenomegaly. Bowel sounds positive. Colostomy on the left side Musculoskeletal: no clubbing / cyanosis. No joint deformity upper and lower extremities. Good ROM, no contractures. Normal muscle tone.  Skin: no rashes, lesions, ulcers. No induration Neurologic: CN 2-12 grossly intact. Sensation intact, DTR normal. Strength 5/5 in all 4.  Psychiatric: Normal judgment and insight. Alert and oriented x 3. Normal mood.   Foley Catheter:None  Labs on Admission: I have personally reviewed following labs and imaging studies  CBC: Recent Labs  Lab 12/07/17 1210  WBC 18.8*  NEUTROABS 15.1*  HGB 14.3  HCT 48.9*  MCV 91.9  PLT 334   Basic Metabolic Panel: Recent Labs  Lab 12/07/17 1210  NA 138  K 4.1  CL 105  CO2 20*  GLUCOSE 134*  BUN 19    CREATININE 1.24*  CALCIUM 9.7   GFR: Estimated Creatinine Clearance: 41.6 mL/min (A) (by C-G formula based on SCr of 1.24 mg/dL (H)). Liver Function Tests: No results for input(s): AST, ALT, ALKPHOS, BILITOT, PROT, ALBUMIN in the last 168 hours. No results for input(s): LIPASE, AMYLASE in the last 168 hours. No results for input(s): AMMONIA in the last 168 hours. Coagulation Profile: Recent Labs  Lab 12/07/17 1210  INR 3.07   Cardiac Enzymes: No results for input(s): CKTOTAL, CKMB, CKMBINDEX, TROPONINI in the last 168 hours. BNP (last 3 results) No results for input(s): PROBNP in the last 8760 hours. HbA1C: No results for input(s): HGBA1C in the last 72 hours. CBG: No results for input(s): GLUCAP in the last 168 hours. Lipid Profile: No results for input(s): CHOL, HDL, LDLCALC, TRIG, CHOLHDL, LDLDIRECT in the last 72 hours. Thyroid Function Tests: No results for input(s): TSH, T4TOTAL, FREET4, T3FREE, THYROIDAB in the last 72 hours. Anemia Panel: No results for input(s): VITAMINB12, FOLATE, FERRITIN, TIBC, IRON, RETICCTPCT in the last 72 hours. Urine analysis:    Component Value Date/Time   COLORURINE YELLOW 12/07/2017 1450   APPEARANCEUR CLOUDY (A) 12/07/2017 1450   LABSPEC 1.015 12/07/2017 1450   PHURINE 5.0 12/07/2017 1450   GLUCOSEU NEGATIVE 12/07/2017 1450   HGBUR SMALL (A) 12/07/2017 1450   BILIRUBINUR NEGATIVE 12/07/2017 1450   KETONESUR 5 (A) 12/07/2017 1450   PROTEINUR NEGATIVE 12/07/2017 1450   UROBILINOGEN 0.2 03/14/2017 1102   NITRITE NEGATIVE 12/07/2017 1450   LEUKOCYTESUR LARGE (A) 12/07/2017 1450    Radiological Exams on Admission: Dg Chest 2 View  Result Date: 12/07/2017 CLINICAL DATA:  Patient fell and is on warfarin.  Leukocytosis. EXAM: CHEST - 2 VIEW COMPARISON:  03/14/2017 CXR FINDINGS: Heart size and mediastinal contours are stable with aortic atherosclerosis. No aneurysm is noted. No pulmonary contusion, effusion or pneumothorax. No acute  displaced rib fracture nor osseous abnormality. Degenerative changes are present along the dorsal spine. IMPRESSION: No active cardiopulmonary disease. Aortic atherosclerosis (ICD10-I70.0) Electronically Signed   By: Tollie Eth M.D.   On: 12/07/2017 14:15   Dg Sacrum/coccyx  Result Date: 12/07/2017 CLINICAL DATA:  Hip pain after fall. EXAM: SACRUM AND  COCCYX - 2+ VIEW COMPARISON:  None. FINDINGS: There is no evidence of fracture or other focal bone lesions. IMPRESSION: Negative. Electronically Signed   By: Lupita Raider, M.D.   On: 12/07/2017 13:27   Ct Head Wo Contrast  Result Date: 12/07/2017 CLINICAL DATA:  Head trauma EXAM: CT HEAD WITHOUT CONTRAST TECHNIQUE: Contiguous axial images were obtained from the base of the skull through the vertex without intravenous contrast. COMPARISON:  Report from 03/17/2001 FINDINGS: BRAIN: Sulcal and ventricular prominence consistent with atrophy is noted. No intraparenchymal hemorrhage, mass effect nor midline shift. No acute large vascular territory infarcts. Small vessel ischemic disease of periventricular white matter are identified, mild-to-moderate. Basal cisterns are not effaced and midline. The brainstem and cerebellar hemispheres are without acute abnormalities. VASCULAR: No hyperdense vessel sign. SKULL/SOFT TISSUES: No skull fracture. No significant soft tissue swelling. ORBITS/SINUSES: The included ocular globes and orbital contents are normal.The mastoid air cells are clear. Small mucous retention cyst in the right maxillary sinus. The included paranasal sinuses are well-aerated. OTHER: None. IMPRESSION: Atrophy with chronic appearing small vessel ischemic disease. No acute intracranial abnormality. Electronically Signed   By: Tollie Eth M.D.   On: 12/07/2017 13:46   Dg Hips Bilat W Or Wo Pelvis 3-4 Views  Result Date: 12/07/2017 CLINICAL DATA:  Bilateral hip pain after fall today. EXAM: DG HIP (WITH OR WITHOUT PELVIS) 3-4V BILAT COMPARISON:   Radiographs of February 06, 2017. FINDINGS: Status post left total hip arthroplasty. Right hip appears normal. No fracture or dislocation is noted. Sacroiliac joints are unremarkable. IMPRESSION: Status post left hip arthroplasty. No acute abnormality seen in either hip. Electronically Signed   By: Lupita Raider, M.D.   On: 12/07/2017 13:26     Assessment/Plan Principal Problem:   Fall at home, initial encounter Active Problems:   Long term current use of anticoagulant therapy   Hypothyroid   Essential hypertension   Diabetes mellitus with neurological manifestations, controlled (HCC)   History of pulmonary embolism   Osteoarthritis   Chronic diastolic CHF (congestive heart failure) (HCC)   Acute lower UTI   UTI (urinary tract infection)   AKI (acute kidney injury) (HCC)   Leucocytosis  Fall: Unknown etiology.  Normally she walks well with a cane.  No report of loss of consciousness.  Denies any dizziness/lightheadedness/funny sensation in the chest before the fall. We will complete syncopal work-up.  Will get orthostatic vitals, will get echocardiogram.  EKG did not show any significant abnormalities. Request for PT evaluation.  Urinary tract infection: Clinically she does not have any symptoms of UTI.  UA suggestive of UTI and she had leukocytosis so we will continue antibiotics for now.  She also states she had increased urinary frequency but not sure whether it is from her diabetes. we have sent urine culture.  Started on ceftriaxone.  Acute kidney injury: Baseline creatinine is normal.  Presented with mild acute kidney injury.  Started on IV fluids.  Will check BMP tomorrow.  History of PE/DVT: On warfarin at home.  Will continue to monitor INR and continue warfarin here.  She also has IVC filter.  History of diastolic CHF: On Lasix at home.  Last echocardiogram on 2015 showed ejection fraction of 65 to 70%, grade 1 diastolic dysfunction..  She looks mildly dehydrated.  Continue  gentle IV fluids.  We are getting an echocardiogram.  Hypothyroidism: Continue Synthyroid  Type 2 diabetes mellitus: On metformin at home.  Continue sliding scale insulin here.  Hypertension: Slightly hypertensive  during my evaluation.  On Lasix , lisinopril and metoprolol at home.  Will hold lisinopril and lasix for the possibility of orthostatic hypotension and she also has AKI.  Will resume on discharge.  History of osteoarthritis: Had 3 hip surgeries already.  Continue supportive care.   Severity of Illness: The appropriate patient status for this patient is OBSERVATION.    DVT prophylaxis: Warfarin Code Status: Full Family Communication: daughters present at the bed side Consults called: None     Burnadette Pop MD [facility] Pager [phone]  If 7PM-7AM, please contact night-coverage [email or web address] Password [facility]  12/07/2017, 4:24 PM

## 2017-12-07 NOTE — Progress Notes (Signed)
New Admission Note:   Arrival Method: Stretcher Mental Orientation: A&O X4 Telemetry: Not Ordered Assessment: Completed Skin: WDL IV: WDL Pain: 6/10 Safety Measures: Safety Fall Prevention Plan has been given, discussed and signed Admission: Completed Unit Orientation: Patient has been orientated to the room, unit and staff.   Orders have been reviewed and implemented. Will continue to monitor the patient. Call light has been placed within reach and bed alarm has been activated.    Selina Cooley, RN

## 2017-12-07 NOTE — ED Provider Notes (Signed)
[facility] EMERGENCY DEPARTMENT Provider Note   CSN: [id number] Arrival date & time: 12/07/17  1153     History   Chief Complaint Chief Complaint  Patient presents with  . Fall    HPI Hannah [name] is a 77 y.o. female with history of pulmonary embolism, type 2 diabetes, hypertension who presents following fall.  Patient reports she was walking from her kitchen to her living room and fell.  She does not remember what happened, because it happened so fast.  She reports she just dropped.  She now has bilateral posterior hip pain.  Patient has been ambulatory.  She has been feeling well prior to this.  Patient denies any chest pain, shortness of breath, abdominal pain, nausea, vomiting, urinary symptoms, neck or back pain.  Patient is anticoagulated on warfarin.  Patient did not lose consciousness.  She denies feeling dizzy or lightheaded prior to the fall.  HPI  Past Medical History:  Diagnosis Date  . Acute massive pulmonary embolism (HCC) 07/13/2012   Massive PE w/ PEA arrest 07/13/12 >TNK >IVC filter >discharged on comadin    . Diabetes mellitus, type 2 (HCC) 07/15/2012   with peripheral neuropathy  . Hyperlipemia 07/14/2012  . Hypertension 07/14/2012  . Large bowel stricture (HCC)    s/p colectomy in 2016  . Osteoarthritis    s/p hip and knee replacements  . Thyroid disease     Patient Active Problem List   Diagnosis Date Noted  . Fall at home, initial encounter 12/07/2017  . Acute lower UTI 12/07/2017  . UTI (urinary tract infection) 12/07/2017  . AKI (acute kidney injury) (HCC) 12/07/2017  . Leucocytosis 12/07/2017  . Chronic diastolic CHF (congestive heart failure) (HCC) 03/15/2017  . Acute GI bleeding 03/15/2017  . Anticoagulated on warfarin   . Pulmonary hypertension (HCC) 07/26/2016  . GI bleed 03/10/2016  . History of DVT in adulthood 03/10/2016  . Acute blood loss anemia 03/10/2016  . Gastrointestinal hemorrhage   . Diabetes mellitus with  complication (HCC)   . Encounter for therapeutic drug monitoring 05/04/2015  . Neuropathy 02/12/2015  . Osteoarthritis 08/12/2014  . Normocytic anemia 06/08/2014  . History of pulmonary embolism 06/10/2013  . Hypothyroid 06/06/2013  . Essential hypertension 06/06/2013  . Diabetes mellitus with neurological manifestations, controlled (HCC) 06/06/2013  . Long term current use of anticoagulant therapy 07/27/2012    Past Surgical History:  Procedure Laterality Date  . COLONOSCOPY N/A 03/12/2016   Procedure: COLONOSCOPY;  Surgeon: Dorena Cookey, MD;  Location: [facility] ENDOSCOPY;  Service: Endoscopy;  Laterality: N/A;  . COLOSTOMY N/A 06/03/2014   Procedure: COLOSTOMY;  Surgeon: Harriette Bouillon, MD;  Location: [facility] OR;  Service: General;  Laterality: N/A;  . FLEXIBLE SIGMOIDOSCOPY N/A 05/30/2014   Procedure: Arnell Sieving;  Surgeon: Jeani Hawking, MD;  Location: [facility] ENDOSCOPY;  Service: Endoscopy;  Laterality: N/A;  . INSERTION OF VENA CAVA FILTER N/A 07/16/2012   Procedure: INSERTION OF VENA CAVA FILTER;  Surgeon: [name], MD;  Location: [facility] CATH LAB;  Service: Cardiovascular;  Laterality: N/A;  . KNEE ARTHROSCOPY    . PARTIAL COLECTOMY N/A 06/03/2014   Procedure: PARTIAL COLECTOMY;  Surgeon: Harriette Bouillon, MD;  Location: [facility] OR;  Service: General;  Laterality: N/A;  . REDUCTION MAMMAPLASTY Bilateral   . SP ARTHRO HIP*L*       OB History   None      Home Medications    Prior to Admission medications   Medication Sig Start Date End Date Taking?  Authorizing Provider  acetaminophen (TYLENOL) 500 MG tablet Take 500 mg by mouth every 6 (six) hours as needed for mild pain.     [provider]  furosemide (LASIX) 20 MG tablet Take 1 tablet (20 mg total) by mouth daily. Patient taking differently: Take 20 mg by mouth as needed.  03/09/17   [name], DO  glucose blood (ONETOUCH VERIO) test strip Use as instructed once a day 09/19/17   Kriste Basque R, DO  glucose blood test strip  Use as instructed once a day 05/26/17   [name], DO  levothyroxine (SYNTHROID, LEVOTHROID) 25 MCG tablet Take 1 tablet by mouth  daily before breakfast Patient taking differently: Take 25 mcg by mouth daily before breakfast.  09/19/17   [name], DO  lisinopril (PRINIVIL,ZESTRIL) 40 MG tablet TAKE 1 TABLET BY MOUTH ONCE DAILY Patient taking differently: Take 40 mg by mouth daily.  10/20/17   [name], DO  metFORMIN (GLUCOPHAGE) 1000 MG tablet TAKE ONE TABLET BY MOUTH WITH BREAKFAST AND TAKE ONE TABLET WITH DINNER 11/22/17   [name], DO  metoprolol tartrate (LOPRESSOR) 25 MG tablet Take 1 tablet (25 mg total) by mouth 2 (two) times daily. 06/09/17   [name], DO  ONETOUCH DELICA LANCETS FINE MISC USE ONCE DAILY. 05/18/17   [name], DO  polyethylene glycol (MIRALAX / GLYCOLAX) packet Take 17 g by mouth daily. Patient taking differently: Take 17 g by mouth daily as needed for mild constipation.  03/19/17   Zigmund Daniel., MD  potassium chloride (K-DUR) 10 MEQ tablet Take as directed--Take one tablet with each furosemide (Lasix) dose 03/02/17   Pricilla Riffle, MD  simvastatin (ZOCOR) 20 MG tablet Take 1 tablet (20 mg total) by mouth at bedtime. 10/26/17   [name], DO  vitamin C (ASCORBIC ACID) 500 MG tablet Take 500 mg by mouth daily.     [provider]  warfarin (COUMADIN) 5 MG tablet Take as directed by [facility] Patient taking differently: Take 5 mg by mouth See admin instructions. Take one tablet (5MG ) by mouth on Sun, Tues, and Thursdays. All other days take 1/2 tablet (2.5MG ) 09/20/17   Pricilla Riffle, MD    Family History Family History  Problem Relation Age of Onset  . Breast cancer Mother     Social History Social History   Tobacco Use  . Smoking status: Former Smoker    Packs/day: 1.00    Years: 10.00    Pack years: 10.00    Types: Cigarettes    Last attempt to quit: 01/04/1968    Years since quitting: 49.9  . Smokeless  tobacco: Never Used  Substance Use Topics  . Alcohol use: No  . Drug use: No     Allergies   Augmentin [amoxicillin-pot clavulanate]; Tranxene [clorazepate]; and Penicillins   Review of Systems Review of Systems  Constitutional: Negative for chills and fever.  HENT: Negative for facial swelling and sore throat.   Respiratory: Negative for shortness of breath.   Cardiovascular: Negative for chest pain.  Gastrointestinal: Negative for abdominal pain, nausea and vomiting.  Genitourinary: Negative for dysuria.  Musculoskeletal: Positive for arthralgias. Negative for back pain and neck pain.  Skin: Negative for rash and wound.  Neurological: Negative for syncope and headaches.  Psychiatric/Behavioral: The patient is not nervous/anxious.      Physical Exam Updated Vital Signs BP (!) 189/75   Pulse 82   Temp 97.6  F (36.4 C) (Oral)   Resp 16   Ht 5\' 7"  (1.702 m)   Wt 78 kg   SpO2 97%   BMI 26.94 kg/m   Physical Exam  Constitutional: She appears well-developed and well-nourished. No distress.  HENT:  Head: Normocephalic and atraumatic.  Mouth/Throat: Oropharynx is clear and moist. No oropharyngeal exudate.  Eyes: Pupils are equal, round, and reactive to light. Conjunctivae are normal. Right eye exhibits no discharge. Left eye exhibits no discharge. No scleral icterus.  Neck: Normal range of motion. Neck supple. No thyromegaly present.  Cardiovascular: Normal rate, regular rhythm, normal heart sounds and intact distal pulses. Exam reveals no gallop and no friction rub.  No murmur heard. Pulmonary/Chest: Effort normal and breath sounds normal. No stridor. No respiratory distress. She has no wheezes. She has no rales.  Abdominal: Soft. Bowel sounds are normal. She exhibits no distension. There is no tenderness. There is no rebound and no guarding.  Musculoskeletal: She exhibits no edema.       Back:  No midline cervical, thoracic, lumbar tenderness  Lymphadenopathy:    She  has no cervical adenopathy.  Neurological: She is alert. Coordination normal.  Normal sensation to all 4 extremities, 5/5 strength to all 4 extremities; equal bilateral grip strength  Skin: Skin is warm and dry. No rash noted. She is not diaphoretic. No pallor.  Psychiatric: She has a normal mood and affect.  Nursing note and vitals reviewed.    ED Treatments / Results  Labs (all labs ordered are listed, but only abnormal results are displayed) Labs Reviewed  BASIC METABOLIC PANEL - Abnormal; Notable for the following components:      Result Value   CO2 20 (*)    Glucose, Bld 134 (*)    Creatinine, Ser 1.24 (*)    GFR calc non Af Amer 42 (*)    GFR calc Af Amer 49 (*)    All other components within normal limits  CBC WITH DIFFERENTIAL/PLATELET - Abnormal; Notable for the following components:   WBC 18.8 (*)    RBC 5.32 (*)    HCT 48.9 (*)    MCHC 29.2 (*)    Neutro Abs 15.1 (*)    Abs Immature Granulocytes 0.08 (*)    All other components within normal limits  PROTIME-INR - Abnormal; Notable for the following components:   Prothrombin Time 31.2 (*)    All other components within normal limits  URINALYSIS, ROUTINE W REFLEX MICROSCOPIC - Abnormal; Notable for the following components:   APPearance CLOUDY (*)    Hgb urine dipstick SMALL (*)    Ketones, ur 5 (*)    Leukocytes, UA LARGE (*)    Bacteria, UA RARE (*)    All other components within normal limits  URINE CULTURE    EKG EKG Interpretation  Date/Time:  Thursday December 07 2017 12:11:12 EST Ventricular Rate:  76 PR Interval:    QRS Duration: 124 QT Interval:  375 QTC Calculation: 422 R Axis:   -59 Text Interpretation:  Sinus rhythm Left axis deviation Left ventricular hypertrophy Nonspecific T abnormalities, lateral leads Confirmed by Raeford Razor [id number]) on 12/07/2017 12:28:02 PM   Radiology Dg Chest 2 View  Result Date: 12/07/2017 CLINICAL DATA:  Patient fell and is on warfarin.  Leukocytosis. EXAM:  CHEST - 2 VIEW COMPARISON:  03/14/2017 CXR FINDINGS: Heart size and mediastinal contours are stable with aortic atherosclerosis. No aneurysm is noted. No pulmonary contusion, effusion or pneumothorax. No acute displaced rib fracture nor  osseous abnormality. Degenerative changes are present along the dorsal spine. IMPRESSION: No active cardiopulmonary disease. Aortic atherosclerosis (ICD10-I70.0) Electronically Signed   By: Tollie Eth M.D.   On: 12/07/2017 14:15   Dg Sacrum/coccyx  Result Date: 12/07/2017 CLINICAL DATA:  Hip pain after fall. EXAM: SACRUM AND COCCYX - 2+ VIEW COMPARISON:  None. FINDINGS: There is no evidence of fracture or other focal bone lesions. IMPRESSION: Negative. Electronically Signed   By: Lupita Raider, M.D.   On: 12/07/2017 13:27   Ct Head Wo Contrast  Result Date: 12/07/2017 CLINICAL DATA:  Head trauma EXAM: CT HEAD WITHOUT CONTRAST TECHNIQUE: Contiguous axial images were obtained from the base of the skull through the vertex without intravenous contrast. COMPARISON:  Report from 03/17/2001 FINDINGS: BRAIN: Sulcal and ventricular prominence consistent with atrophy is noted. No intraparenchymal hemorrhage, mass effect nor midline shift. No acute large vascular territory infarcts. Small vessel ischemic disease of periventricular white matter are identified, mild-to-moderate. Basal cisterns are not effaced and midline. The brainstem and cerebellar hemispheres are without acute abnormalities. VASCULAR: No hyperdense vessel sign. SKULL/SOFT TISSUES: No skull fracture. No significant soft tissue swelling. ORBITS/SINUSES: The included ocular globes and orbital contents are normal.The mastoid air cells are clear. Small mucous retention cyst in the right maxillary sinus. The included paranasal sinuses are well-aerated. OTHER: None. IMPRESSION: Atrophy with chronic appearing small vessel ischemic disease. No acute intracranial abnormality. Electronically Signed   By: Tollie Eth M.D.   On:  12/07/2017 13:46   Dg Hips Bilat W Or Wo Pelvis 3-4 Views  Result Date: 12/07/2017 CLINICAL DATA:  Bilateral hip pain after fall today. EXAM: DG HIP (WITH OR WITHOUT PELVIS) 3-4V BILAT COMPARISON:  Radiographs of February 06, 2017. FINDINGS: Status post left total hip arthroplasty. Right hip appears normal. No fracture or dislocation is noted. Sacroiliac joints are unremarkable. IMPRESSION: Status post left hip arthroplasty. No acute abnormality seen in either hip. Electronically Signed   By: Lupita Raider, M.D.   On: 12/07/2017 13:26    Procedures Procedures (including critical care time)  Medications Ordered in ED Medications  acetaminophen (TYLENOL) tablet 1,000 mg (has no administration in time range)  cefTRIAXone (ROCEPHIN) 1 g in sodium chloride 0.9 % 100 mL IVPB (has no administration in time range)  cefTRIAXone (ROCEPHIN) 1 g in sodium chloride 0.9 % 100 mL IVPB (has no administration in time range)  0.9 %  sodium chloride infusion (has no administration in time range)  simvastatin (ZOCOR) tablet 20 mg (has no administration in time range)  levothyroxine (SYNTHROID, LEVOTHROID) tablet 25 mcg (has no administration in time range)  insulin aspart (novoLOG) injection 0-9 Units (has no administration in time range)  insulin aspart (novoLOG) injection 0-5 Units (has no administration in time range)  sodium chloride 0.9 % bolus 500 mL (0 mLs Intravenous Stopped 12/07/17 1454)     Initial Impression / Assessment and Plan / ED Course  I have reviewed the triage vital signs and the nursing notes.  Pertinent labs & imaging results that were available during my care of the patient were reviewed by me and considered in my medical decision making (see chart for details).     Patient presenting with UTI and AKI in the setting of fall.  Patient did not trip and denies feeling dizzy or lightheaded.  She just dropped.  X-rays are negative for fracture.  CT head is negative.  Patient is found to  have large leukocytes on UA.  Creatinine is 1.24.  WBC 18.8.  Patient has not been feeling well over the past week or so.  Suspect patient fall may be related to UTI and AKI.  Will admit for IV antibiotics and further work-up.  Rocephin initiated in the ED.  Discussed patient case with Dr. Renford Dills with [facility] who accepts patient for observation admission.  Appreciate his assistance with the patient. I discussed patient case with Dr. Rubin Payor who guided the patient's management and agrees with plan.   Final Clinical Impressions(s) / ED Diagnoses   Final diagnoses:  Lower urinary tract infectious disease  Fall, initial encounter  AKI (acute kidney injury) [facility])    ED Discharge Orders    None       Emi Holes, PA-C 12/07/17 1842    Benjiman Core, MD 12/08/17 0700

## 2017-12-07 NOTE — ED Notes (Signed)
Pt aware that a urine sample is needed, will provide one when able.

## 2017-12-07 NOTE — ED Notes (Signed)
Please note: urine culture collected in addition to UA.

## 2017-12-07 NOTE — ED Notes (Signed)
Pt transported to XR.  

## 2017-12-07 NOTE — ED Notes (Signed)
Pt given diet ginger ale.

## 2017-12-08 ENCOUNTER — Observation Stay (HOSPITAL_BASED_OUTPATIENT_CLINIC_OR_DEPARTMENT_OTHER): Payer: Medicare Other

## 2017-12-08 DIAGNOSIS — I361 Nonrheumatic tricuspid (valve) insufficiency: Secondary | ICD-10-CM

## 2017-12-08 DIAGNOSIS — N390 Urinary tract infection, site not specified: Secondary | ICD-10-CM | POA: Diagnosis not present

## 2017-12-08 DIAGNOSIS — Y92009 Unspecified place in unspecified non-institutional (private) residence as the place of occurrence of the external cause: Secondary | ICD-10-CM | POA: Diagnosis not present

## 2017-12-08 DIAGNOSIS — W19XXXA Unspecified fall, initial encounter: Secondary | ICD-10-CM | POA: Diagnosis not present

## 2017-12-08 LAB — CBC
HCT: 39.3 % (ref 36.0–46.0)
Hemoglobin: 11.9 g/dL — ABNORMAL LOW (ref 12.0–15.0)
MCH: 27.3 pg (ref 26.0–34.0)
MCHC: 30.3 g/dL (ref 30.0–36.0)
MCV: 90.1 fL (ref 80.0–100.0)
PLATELETS: 230 10*3/uL (ref 150–400)
RBC: 4.36 MIL/uL (ref 3.87–5.11)
RDW: 15.1 % (ref 11.5–15.5)
WBC: 8.8 10*3/uL (ref 4.0–10.5)
nRBC: 0 % (ref 0.0–0.2)

## 2017-12-08 LAB — BASIC METABOLIC PANEL
Anion gap: 9 (ref 5–15)
BUN: 16 mg/dL (ref 8–23)
CO2: 24 mmol/L (ref 22–32)
Calcium: 8.6 mg/dL — ABNORMAL LOW (ref 8.9–10.3)
Chloride: 107 mmol/L (ref 98–111)
Creatinine, Ser: 1.17 mg/dL — ABNORMAL HIGH (ref 0.44–1.00)
GFR, EST AFRICAN AMERICAN: 52 mL/min — AB (ref >60)
GFR, EST NON AFRICAN AMERICAN: 45 mL/min — AB (ref >60)
Glucose, Bld: 102 mg/dL — ABNORMAL HIGH (ref 70–99)
Potassium: 4.1 mmol/L (ref 3.5–5.1)
Sodium: 140 mmol/L (ref 135–145)

## 2017-12-08 LAB — ECHOCARDIOGRAM COMPLETE
HEIGHTINCHES: 67 in
Weight: 2761.92 oz

## 2017-12-08 LAB — URINE CULTURE: Culture: >=100000 — AB

## 2017-12-08 LAB — GLUCOSE, CAPILLARY
Glucose-Capillary: 100 mg/dL — ABNORMAL HIGH (ref 70–99)
Glucose-Capillary: 392 mg/dL — ABNORMAL HIGH (ref 70–99)
Glucose-Capillary: 60 mg/dL — ABNORMAL LOW (ref 70–99)
Glucose-Capillary: 97 mg/dL (ref 70–99)

## 2017-12-08 LAB — PROTIME-INR
INR: 3.34
Prothrombin Time: 33.4 seconds — ABNORMAL HIGH (ref 11.4–15.2)

## 2017-12-08 MED ORDER — TRAMADOL HCL 50 MG PO TABS: 50.0000 mg | ORAL_TABLET | Freq: Four times a day (QID) | ORAL | Status: DC | PRN

## 2017-12-08 MED ORDER — LISINOPRIL 40 MG PO TABS
40.0000 mg | ORAL_TABLET | Freq: Every day | ORAL | Status: DC
  Administered 2017-12-08: 40 mg via ORAL
  Filled 2017-12-08: qty 1

## 2017-12-08 MED ORDER — ACETAMINOPHEN 325 MG PO TABS
650.0000 mg | ORAL_TABLET | Freq: Four times a day (QID) | ORAL | Status: DC | PRN
  Administered 2017-12-08: 650 mg via ORAL
  Filled 2017-12-08: qty 2

## 2017-12-08 MED ORDER — TRAMADOL HCL 50 MG PO TABS: 50.0000 mg | ORAL_TABLET | Freq: Four times a day (QID) | ORAL | 0 refills | Status: DC | PRN

## 2017-12-08 NOTE — Progress Notes (Signed)
  Echocardiogram 2D Echocardiogram has been performed.  Tye Savoy 12/08/2017, 9:02 AM

## 2017-12-08 NOTE — Progress Notes (Signed)
Hannah [name] to be D/C'd Home per MD order.  Discussed prescriptions and follow up appointments with the patient. Prescriptions given to patient, medication list explained in detail. Pt verbalized understanding.  Allergies as of 12/08/2017      Reactions   Augmentin [amoxicillin-pot Clavulanate] Other (See Comments)   Severe vaginal itching   Tranxene [clorazepate] Itching   Penicillins Itching, Rash   Has patient had a PCN reaction causing immediate rash, facial/tongue/throat swelling, SOB or lightheadedness with hypotension: Yes Has patient had a PCN reaction causing severe rash involving mucus membranes or skin necrosis: No Has patient had a PCN reaction that required hospitalization: No Has patient had a PCN reaction occurring within the last 10 years: No If all of the above answers are "NO", then may proceed with Cephalosporin use.      Medication List    STOP taking these medications   potassium chloride 10 MEQ tablet Commonly known as:  K-DUR     TAKE these medications   acetaminophen 500 MG tablet Commonly known as:  TYLENOL Take 500 mg by mouth every 6 (six) hours as needed for mild pain.   furosemide 20 MG tablet Commonly known as:  LASIX Take 1 tablet (20 mg total) by mouth daily. What changed:    when to take this  reasons to take this   glucose blood test strip Use as instructed once a day   glucose blood test strip Use as instructed once a day   levothyroxine 25 MCG tablet Commonly known as:  SYNTHROID, LEVOTHROID Take 1 tablet by mouth  daily before breakfast What changed:    how much to take  how to take this  when to take this  additional instructions   lisinopril 40 MG tablet Commonly known as:  PRINIVIL,ZESTRIL TAKE 1 TABLET BY MOUTH ONCE DAILY   metFORMIN 1000 MG tablet Commonly known as:  GLUCOPHAGE TAKE ONE TABLET BY MOUTH WITH BREAKFAST AND TAKE ONE TABLET WITH DINNER What changed:    how much to take  how to take this  when to  take this  additional instructions   metoprolol tartrate 25 MG tablet Commonly known as:  LOPRESSOR Take 1 tablet (25 mg total) by mouth 2 (two) times daily.   ONETOUCH DELICA LANCETS FINE Misc USE ONCE DAILY.   polyethylene glycol packet Commonly known as:  MIRALAX / GLYCOLAX Take 17 g by mouth daily. What changed:    when to take this  reasons to take this   simvastatin 20 MG tablet Commonly known as:  ZOCOR Take 1 tablet (20 mg total) by mouth at bedtime.   traMADol 50 MG tablet Commonly known as:  ULTRAM Take 1 tablet (50 mg total) by mouth every 6 (six) hours as needed for moderate pain.   vitamin C 500 MG tablet Commonly known as:  ASCORBIC ACID Take 500 mg by mouth daily.   warfarin 5 MG tablet Commonly known as:  COUMADIN Take as directed. If you are unsure how to take this medication, talk to your nurse or doctor. Original instructions:  Take as directed by [facility] What changed:    how much to take  how to take this  when to take this  additional instructions       Vitals:   12/08/17 0942 12/08/17 0947  BP: (!) 152/77 (!) 166/93  Pulse: 88 (!) 103  Resp:    Temp:    SpO2: 100% 97%    Skin clean, dry and intact without  evidence of skin break down, no evidence of skin tears noted. IV catheter discontinued intact. Site without signs and symptoms of complications. Dressing and pressure applied. Pt denies pain at this time. No complaints noted.  An After Visit Summary was printed and given to the patient. Patient escorted via WC, and D/C home via private auto.  Britt Bolognese RN, BSN

## 2017-12-08 NOTE — Care Management Note (Signed)
Case Management Note  Patient Details  Name: Hannah [name] MRN: [id number] Date of Birth: 1940-01-06  Subjective/Objective: 77 yo female presented for evaluation after a fall.                  Action/Plan: CM met with patient/daughter to discuss dispositional needs. Patient states living at home alone, independent wit ADLs PTA, ambulating with a SPC, rollator. PCP verified as: Dr. Koren Bound. PT recommended HHPT with intermittent supervision, with patient agreeable to HHPT and daughter indicating she will assist as needed. CMS Medicare compare listed provided to patient, with [facility] selected. HHPT referral given to [facility] liaison; AVS updated. Patients daughter will provide transportation home. No further needs from CM.   Expected Discharge Date:  12/08/17               Expected Discharge Plan:  [place]  In-House Referral:  NA  Discharge planning Services  CM Consult  Post Acute Care Choice:  Home Health Choice offered to:  Patient  DME Arranged:  N/A DME Agency:  NA  HH Arranged:  PT [place] Agency:  [place]  Status of Service:  Completed, signed off  If discussed at [place] of Stay Meetings, dates discussed:    Additional Comments:  Midge Minium RN, BSN, NCM-BC, ACM-RN [phone] 12/08/2017, 2:51 PM

## 2017-12-08 NOTE — Progress Notes (Signed)
CRITICAL VALUE ALERT  Critical Value: CBG:60  Date & Time Notied: 12/08/17 1200  Orders Received/Actions taken: Patient ate lunch. CBG now 97. Will continue to monitor.

## 2017-12-08 NOTE — Progress Notes (Signed)
Patient is complaining of pain and requesting Tylenol. No current PRN orders. MD notified. Orders placed. Will continue to monitor.

## 2017-12-08 NOTE — Progress Notes (Signed)
   12/08/17 1100  Clinical Encounter Type  Visited With Patient  Visit Type Initial;Spiritual support  Spiritual Encounters  Spiritual Needs Prayer  Responded to [facility] consult for prayer. Patient was sitting up and welcomed the visit. Provided spiritual support through prayer. Will return at patient request.

## 2017-12-08 NOTE — Evaluation (Signed)
Physical Therapy Evaluation Patient Details Name: Hannah [name] MRN: [id number] DOB: June 21, 1940 Today's Date: 12/08/2017   History of Present Illness   Hannah [name] is a 77 y.o. female with medical history significant of PE on Coumadin, Diabetes mellitus, hypertension, hypothyroidism, history of sigmoid stricture status post partial colectomy and colostomy,  GI bleed who presented from home today to the emergency department for the evaluation of fall.  Clinical Impression  Patient presents with decreased mobility due to soreness from fall at home and some decreased balance.  She reports will have some increased help initially upon d/c, so feel she can return home with assist and follow up HHPT.  PT to follow acutely.    Follow Up Recommendations Home health PT;Supervision - Intermittent    Equipment Recommendations  None recommended by PT    Recommendations for Other Services       Precautions / Restrictions Precautions Precautions: Fall      Mobility  Bed Mobility Overal bed mobility: Needs Assistance Bed Mobility: Supine to Sit     Supine to sit: Min assist     General bed mobility comments: cues to come up from sidelying as back pain, but pt already with legs off bed and difficulty reaching rail to sit up so assisted with trunk  Transfers Overall transfer level: Needs assistance Equipment used: Rolling walker (2 wheeled) Transfers: Sit to/from Stand Sit to Stand: Supervision         General transfer comment: increased time, pushed up from bed  Ambulation/Gait Ambulation/Gait assistance: Supervision;Min guard Gait Distance (Feet): 90 Feet Assistive device: Rolling walker (2 wheeled) Gait Pattern/deviations: Step-through pattern;Decreased stride length     General Gait Details: slow but steady pace, appropriate for injury and environment, able to turn with RW in hallway without difficulty  Stairs            Wheelchair Mobility    Modified Rankin  (Stroke Patients Only)       Balance Overall balance assessment: Needs assistance   Sitting balance-Leahy Scale: Good       Standing balance-Leahy Scale: Fair Standing balance comment: stood for hygiene after toileting                             Pertinent Vitals/Pain Pain Assessment: 0-10 Pain Score: 8  Pain Location: lower back and bilateral hips Pain Descriptors / Indicators: Sore Pain Intervention(s): Monitored during session;Repositioned;Patient requesting pain meds-RN notified    Home Living Family/patient expects to be discharged to:: Private residence Living Arrangements: Alone Available Help at Discharge: Family;Available PRN/intermittently Type of Home: House Home Access: Stairs to enter Entrance Stairs-Rails: Right;Left;Can reach both Entrance Stairs-Number of Steps: 5 Home Layout: One level Home Equipment: Cane - single point;Walker - 4 wheels;Bedside commode;Shower seat Additional Comments: has lift alert    Prior Function Level of Independence: Independent with assistive device(s)               Hand Dominance        Extremity/Trunk Assessment        Lower Extremity Assessment Lower Extremity Assessment: Overall WFL for tasks assessed       Communication   Communication: No difficulties  Cognition Arousal/Alertness: Awake/alert Behavior During Therapy: WFL for tasks assessed/performed Overall Cognitive Status: Within Functional Limits for tasks assessed  General Comments General comments (skin integrity, edema, etc.): toileted on BSC min A for clothing management due to two gowns and mesh briefs with pad    Exercises     Assessment/Plan    PT Assessment Patient needs continued PT services  PT Problem List Pain;Decreased activity tolerance;Decreased knowledge of use of DME;Decreased mobility;Decreased strength       PT Treatment Interventions DME  instruction;Therapeutic activities;Therapeutic exercise;Gait training;Stair training;Functional mobility training;Balance training    PT Goals (Current goals can be found in the Care Plan section)  Acute Rehab PT Goals Patient Stated Goal: to return to independent PT Goal Formulation: With patient Time For Goal Achievement: 12/15/17 Potential to Achieve Goals: Good    Frequency Min 3X/week   Barriers to discharge        Co-evaluation               AM-PAC PT "6 Clicks" Mobility  Outcome Measure Help needed turning from your back to your side while in a flat bed without using bedrails?: A Little Help needed moving from lying on your back to sitting on the side of a flat bed without using bedrails?: A Little Help needed moving to and from a bed to a chair (including a wheelchair)?: A Little Help needed standing up from a chair using your arms (e.g., wheelchair or bedside chair)?: A Little Help needed to walk in hospital room?: A Little Help needed climbing 3-5 steps with a railing? : A Little 6 Click Score: 18    End of Session Equipment Utilized During Treatment: Gait belt Activity Tolerance: Patient tolerated treatment well Patient left: with call bell/phone within reach;in chair;with chair alarm set Nurse Communication: Mobility status PT Visit Diagnosis: History of falling (Z91.81);Other abnormalities of gait and mobility (R26.89)    Time: 4098-1191 PT Time Calculation (min) (ACUTE ONLY): 23 min   Charges:   PT Evaluation $PT Eval Low Complexity: 1 Low PT Treatments $Gait Training: 8-22 mins        Sheran Lawless, [facility] [phone] 12/08/2017   Elray Mcgregor 12/08/2017, 1:46 PM

## 2017-12-08 NOTE — Discharge Summary (Signed)
Physician Discharge Summary  Hannah [name] ZOX:[id number] DOB: 04/07/1940 DOA: 12/07/2017  PCP: [name], DO  Admit date: 12/07/2017 Discharge date: 12/08/2017  Admitted From: Home Disposition:  Home  Discharge Condition:Stable CODE STATUS:FULL Diet recommendation: Heart Healthy  Brief/Interim Summary: Hannah [name] is a 77 y.o. female with medical history significant of PE on Coumadin, Diabetes mellitus, hypertension, hypothyroidism, history of sigmoid stricture status post partial colectomy and colostomy,  GI bleed who presented from home today to the emergency department for the evaluation of fall.  Patient was apparently all right till this morning.  She was walking from her kitchen to her living room when all of a sudden she fell.  She does not remember what happened.  She denies any lightheadedness, dizziness, palpitations or funny for sensation of the chest.  She did not tip on anything.  No report of loss of consciousness.  No jerking movements of the body.  She complains of bilateral hip pain after the fall.  Patient usually walks with the help of cane. Patient admitted for further evaluation.  Her urinalysis was suggestive of urinary tract infection so she was started on ceftriaxone.  Urine culture revealed multiple organisms and she did not have any symptoms of urine tract infection so antibiotics discontinued.  Patient seen by physical therapist and recommended home health PT.  Echocardiogram did not reveal significant abnormalities.  Her orthostatic vitals are negative today.  She is stable for discharge to home today.  Following problems were addressed during hospitalization:  Fall: Unknown etiology.  Normally she walks well with a cane.  No report of loss of consciousness.  Denies any dizziness/lightheadedness/funny sensation in the chest before the fall. Syncope workup done.  It showed ejection fraction of 55 to 60%, no wall motion abnormality, grade 1 diastolic dysfunction.   Orthostatic vitals checked this morning are negative.  EKG did not show any significant abnormalities.  PT commended home health PT.  Urinary tract infection: Clinically she does not have any symptoms of UTI.  UA suggestive of UTI and she had leukocytosis so she was started on  antibiotics She also states she had increased urinary frequency but not sure whether it is from her diabetes.   Urine culture revealed multiple organisms.  Antibiotics discontinued today.  Acute kidney injury: Baseline creatinine is normal.  Presented with mild acute kidney injury.  Started on IV fluids with improvement.  History of PE/DVT: On warfarin at home.    She also has IVC filter.  History of diastolic CHF: On Lasix at home.  Last echocardiogram on 2015 showed ejection fraction of 65 to 70%, grade 1 diastolic dysfunction.Echo done on this visit as above.  Hypothyroidism: Continue Synthyroid  Type 2 diabetes mellitus: On metformin at home.   Hypertension: Resume home meds on discharge.  History of osteoarthritis: Had 3 hip surgeries already.  Continue supportive care.Tramdol ordered.   Discharge Diagnoses:  Principal Problem:   Fall at home, initial encounter Active Problems:   Long term current use of anticoagulant therapy   Hypothyroid   Essential hypertension   Diabetes mellitus with neurological manifestations, controlled (HCC)   History of pulmonary embolism   Osteoarthritis   Chronic diastolic CHF (congestive heart failure) (HCC)   Acute lower UTI   UTI (urinary tract infection)   AKI (acute kidney injury) (HCC)   Leucocytosis    Discharge Instructions  Discharge Instructions    Diet - low sodium heart healthy   Complete by:  As directed    Discharge  instructions   Complete by:  As directed    1) Please follow up with your PCP in a week.  Do a CBC, BMP test during the follow-up 2) Please monitor your INR regularly.  Goal of INR is between 2 -3.  You donot need to take Coumadin  today because your INR is more than 3.   Increase activity slowly   Complete by:  As directed      Allergies as of 12/08/2017      Reactions   Augmentin [amoxicillin-pot Clavulanate] Other (See Comments)   Severe vaginal itching   Tranxene [clorazepate] Itching   Penicillins Itching, Rash   Has patient had a PCN reaction causing immediate rash, facial/tongue/throat swelling, SOB or lightheadedness with hypotension: Yes Has patient had a PCN reaction causing severe rash involving mucus membranes or skin necrosis: No Has patient had a PCN reaction that required hospitalization: No Has patient had a PCN reaction occurring within the last 10 years: No If all of the above answers are "NO", then may proceed with Cephalosporin use.      Medication List    STOP taking these medications   potassium chloride 10 MEQ tablet Commonly known as:  K-DUR     TAKE these medications   acetaminophen 500 MG tablet Commonly known as:  TYLENOL Take 500 mg by mouth every 6 (six) hours as needed for mild pain.   furosemide 20 MG tablet Commonly known as:  LASIX Take 1 tablet (20 mg total) by mouth daily. What changed:    when to take this  reasons to take this   glucose blood test strip Use as instructed once a day   glucose blood test strip Use as instructed once a day   levothyroxine 25 MCG tablet Commonly known as:  SYNTHROID, LEVOTHROID Take 1 tablet by mouth  daily before breakfast What changed:    how much to take  how to take this  when to take this  additional instructions   lisinopril 40 MG tablet Commonly known as:  PRINIVIL,ZESTRIL TAKE 1 TABLET BY MOUTH ONCE DAILY   metFORMIN 1000 MG tablet Commonly known as:  GLUCOPHAGE TAKE ONE TABLET BY MOUTH WITH BREAKFAST AND TAKE ONE TABLET WITH DINNER What changed:    how much to take  how to take this  when to take this  additional instructions   metoprolol tartrate 25 MG tablet Commonly known as:  LOPRESSOR Take  1 tablet (25 mg total) by mouth 2 (two) times daily.   ONETOUCH DELICA LANCETS FINE Misc USE ONCE DAILY.   polyethylene glycol packet Commonly known as:  MIRALAX / GLYCOLAX Take 17 g by mouth daily. What changed:    when to take this  reasons to take this   simvastatin 20 MG tablet Commonly known as:  ZOCOR Take 1 tablet (20 mg total) by mouth at bedtime.   traMADol 50 MG tablet Commonly known as:  ULTRAM Take 1 tablet (50 mg total) by mouth every 6 (six) hours as needed for moderate pain.   vitamin C 500 MG tablet Commonly known as:  ASCORBIC ACID Take 500 mg by mouth daily.   warfarin 5 MG tablet Commonly known as:  COUMADIN Take as directed. If you are unsure how to take this medication, talk to your nurse or doctor. Original instructions:  Take as directed by [facility] What changed:    how much to take  how to take this  when to take this  additional instructions  Follow-up Information    [name], DO. Schedule an appointment as soon as possible for a visit in 1 week(s).   Specialty:  Family Medicine Contact information: [address] [place] [address] [phone]          Allergies  Allergen Reactions  . Augmentin [Amoxicillin-Pot Clavulanate] Other (See Comments)    Severe vaginal itching  . Tranxene [Clorazepate] Itching  . Penicillins Itching and Rash    Has patient had a PCN reaction causing immediate rash, facial/tongue/throat swelling, SOB or lightheadedness with hypotension: Yes Has patient had a PCN reaction causing severe rash involving mucus membranes or skin necrosis: No Has patient had a PCN reaction that required hospitalization: No Has patient had a PCN reaction occurring within the last 10 years: No If all of the above answers are "NO", then may proceed with Cephalosporin use.     Consultations:  None   Procedures/Studies: Dg Chest 2 View  Result Date: 12/07/2017 CLINICAL DATA:  Patient fell  and is on warfarin.  Leukocytosis. EXAM: CHEST - 2 VIEW COMPARISON:  03/14/2017 CXR FINDINGS: Heart size and mediastinal contours are stable with aortic atherosclerosis. No aneurysm is noted. No pulmonary contusion, effusion or pneumothorax. No acute displaced rib fracture nor osseous abnormality. Degenerative changes are present along the dorsal spine. IMPRESSION: No active cardiopulmonary disease. Aortic atherosclerosis (ICD10-I70.0) Electronically Signed   By: Tollie Eth M.D.   On: 12/07/2017 14:15   Dg Sacrum/coccyx  Result Date: 12/07/2017 CLINICAL DATA:  Hip pain after fall. EXAM: SACRUM AND COCCYX - 2+ VIEW COMPARISON:  None. FINDINGS: There is no evidence of fracture or other focal bone lesions. IMPRESSION: Negative. Electronically Signed   By: Lupita Raider, M.D.   On: 12/07/2017 13:27   Ct Head Wo Contrast  Result Date: 12/07/2017 CLINICAL DATA:  Head trauma EXAM: CT HEAD WITHOUT CONTRAST TECHNIQUE: Contiguous axial images were obtained from the base of the skull through the vertex without intravenous contrast. COMPARISON:  Report from 03/17/2001 FINDINGS: BRAIN: Sulcal and ventricular prominence consistent with atrophy is noted. No intraparenchymal hemorrhage, mass effect nor midline shift. No acute large vascular territory infarcts. Small vessel ischemic disease of periventricular white matter are identified, mild-to-moderate. Basal cisterns are not effaced and midline. The brainstem and cerebellar hemispheres are without acute abnormalities. VASCULAR: No hyperdense vessel sign. SKULL/SOFT TISSUES: No skull fracture. No significant soft tissue swelling. ORBITS/SINUSES: The included ocular globes and orbital contents are normal.The mastoid air cells are clear. Small mucous retention cyst in the right maxillary sinus. The included paranasal sinuses are well-aerated. OTHER: None. IMPRESSION: Atrophy with chronic appearing small vessel ischemic disease. No acute intracranial abnormality.  Electronically Signed   By: Tollie Eth M.D.   On: 12/07/2017 13:46   Dg Hips Bilat W Or Wo Pelvis 3-4 Views  Result Date: 12/07/2017 CLINICAL DATA:  Bilateral hip pain after fall today. EXAM: DG HIP (WITH OR WITHOUT PELVIS) 3-4V BILAT COMPARISON:  Radiographs of February 06, 2017. FINDINGS: Status post left total hip arthroplasty. Right hip appears normal. No fracture or dislocation is noted. Sacroiliac joints are unremarkable. IMPRESSION: Status post left hip arthroplasty. No acute abnormality seen in either hip. Electronically Signed   By: Lupita Raider, M.D.   On: 12/07/2017 13:26      Subjective: Patient seen and examined at bedside this afternoon.  Remains comfortable.  Needs PT evaluation.  No active issues.  Stable for discharge to home today.  Discharge Exam: Vitals:   12/08/17 5784  12/08/17 0947  BP: (!) 152/77 (!) 166/93  Pulse: 88 (!) 103  Resp:    Temp:    SpO2: 100% 97%   Vitals:   12/08/17 0457 12/08/17 0757 12/08/17 0942 12/08/17 0947  BP: 131/69 (!) 128/96 (!) 152/77 (!) 166/93  Pulse: 66 72 88 (!) 103  Resp: 18 18    Temp: 98 F (36.7 C) 98.2 F (36.8 C)    TempSrc:  Oral    SpO2: 100% 100% 100% 97%  Weight:      Height:        General: Pt is alert, awake, not in acute distress Cardiovascular: RRR, S1/S2 +, no rubs, no gallops Respiratory: CTA bilaterally, no wheezing, no rhonchi Abdominal: Soft, NT, ND, bowel sounds + Extremities: no edema, no cyanosis    The results of significant diagnostics from this hospitalization (including imaging, microbiology, ancillary and laboratory) are listed below for reference.     Microbiology: Recent Results (from the past 240 hour(s))  Urine culture     Status: Abnormal   Collection Time: 12/07/17  2:52 PM  Result Value Ref Range Status   Specimen Description URINE, RANDOM  Final   Special Requests   Final    NONE Performed at [facility], [address] [place], [place] [address]    Culture (A)   Final    >=100,000 COLONIES/mL MULTIPLE SPECIES PRESENT, SUGGEST RECOLLECTION   Report Status 12/08/2017 FINAL  Final     Labs: BNP (last 3 results) No results for input(s): BNP in the last 8760 hours. Basic Metabolic Panel: Recent Labs  Lab 12/07/17 1210 12/08/17 0514  NA 138 140  K 4.1 4.1  CL 105 107  CO2 20* 24  GLUCOSE 134* 102*  BUN 19 16  CREATININE 1.24* 1.17*  CALCIUM 9.7 8.6*   Liver Function Tests: No results for input(s): AST, ALT, ALKPHOS, BILITOT, PROT, ALBUMIN in the last 168 hours. No results for input(s): LIPASE, AMYLASE in the last 168 hours. No results for input(s): AMMONIA in the last 168 hours. CBC: Recent Labs  Lab 12/07/17 1210 12/08/17 0514  WBC 18.8* 8.8  NEUTROABS 15.1*  --   HGB 14.3 11.9*  HCT 48.9* 39.3  MCV 91.9 90.1  PLT 334 230   Cardiac Enzymes: No results for input(s): CKTOTAL, CKMB, CKMBINDEX, TROPONINI in the last 168 hours. BNP: Invalid input(s): POCBNP CBG: Recent Labs  Lab 12/07/17 1743 12/07/17 2109 12/08/17 0930 12/08/17 1140 12/08/17 1236  GLUCAP 100* 115* 392* 60* 97   D-Dimer No results for input(s): DDIMER in the last 72 hours. Hgb A1c No results for input(s): HGBA1C in the last 72 hours. Lipid Profile No results for input(s): CHOL, HDL, LDLCALC, TRIG, CHOLHDL, LDLDIRECT in the last 72 hours. Thyroid function studies No results for input(s): TSH, T4TOTAL, T3FREE, THYROIDAB in the last 72 hours.  Invalid input(s): FREET3 Anemia work up No results for input(s): VITAMINB12, FOLATE, FERRITIN, TIBC, IRON, RETICCTPCT in the last 72 hours. Urinalysis    Component Value Date/Time   COLORURINE YELLOW 12/07/2017 1450   APPEARANCEUR CLOUDY (A) 12/07/2017 1450   LABSPEC 1.015 12/07/2017 1450   PHURINE 5.0 12/07/2017 1450   GLUCOSEU NEGATIVE 12/07/2017 1450   HGBUR SMALL (A) 12/07/2017 1450   BILIRUBINUR NEGATIVE 12/07/2017 1450   KETONESUR 5 (A) 12/07/2017 1450   PROTEINUR NEGATIVE 12/07/2017 1450    UROBILINOGEN 0.2 03/14/2017 1102   NITRITE NEGATIVE 12/07/2017 1450   LEUKOCYTESUR LARGE (A) 12/07/2017 1450   Sepsis Labs Invalid  input(s): PROCALCITONIN,  WBC,  LACTICIDVEN Microbiology Recent Results (from the past 240 hour(s))  Urine culture     Status: Abnormal   Collection Time: 12/07/17  2:52 PM  Result Value Ref Range Status   Specimen Description URINE, RANDOM  Final   Special Requests   Final    NONE Performed at [facility], [address] [place], [place] [address]    Culture (A)  Final    >=100,000 COLONIES/mL MULTIPLE SPECIES PRESENT, SUGGEST RECOLLECTION   Report Status 12/08/2017 FINAL  Final    Please note: You were cared for by a hospitalist during your hospital stay. Once you are discharged, your primary care physician will handle any further medical issues. Please note that NO REFILLS for any discharge medications will be authorized once you are discharged, as it is imperative that you return to your primary care physician (or establish a relationship with a primary care physician if you do not have one) for your post hospital discharge needs so that they can reassess your need for medications and monitor your lab values.    Time coordinating discharge: 40 minutes  SIGNED:   Burnadette Pop, MD  [facility] 12/08/2017, 2:15 PM Pager [phone]  If 7PM-7AM, please contact night-coverage [email or web address] Password [email or web address]

## 2017-12-08 NOTE — Progress Notes (Signed)
ANTICOAGULATION CONSULT NOTE  Pharmacy Consult for Coumadin Indication: h/o DVT/PE  Allergies  Allergen Reactions  . Augmentin [Amoxicillin-Pot Clavulanate] Other (See Comments)    Severe vaginal itching  . Tranxene [Clorazepate] Itching  . Penicillins Itching and Rash    Has patient had a PCN reaction causing immediate rash, facial/tongue/throat swelling, SOB or lightheadedness with hypotension: Yes Has patient had a PCN reaction causing severe rash involving mucus membranes or skin necrosis: No Has patient had a PCN reaction that required hospitalization: No Has patient had a PCN reaction occurring within the last 10 years: No If all of the above answers are "NO", then may proceed with Cephalosporin use.     Patient Measurements: Height: 5\' 7"  (170.2 cm) Weight: 172 lb 9.9 oz (78.3 kg) IBW/kg (Calculated) : 61.6  Vital Signs: Temp: 98.2 F (36.8 C) (12/06 0757) Temp Source: Oral (12/06 0757) BP: 128/96 (12/06 0757) Pulse Rate: 72 (12/06 0757)  Labs: Recent Labs    12/07/17 1210 12/08/17 0514  HGB 14.3 11.9*  HCT 48.9* 39.3  PLT 334 230  LABPROT 31.2* 33.4*  INR 3.07 3.34  CREATININE 1.24* 1.17*    Estimated Creatinine Clearance: 44.1 mL/min (A) (by C-G formula based on SCr of 1.17 mg/dL (H)).  Assessment: CC/HPI: Fall  PMH: 2014 Massive PE w/ PEA arrest 07/13/12 >TNK >IVC filter ,  DM, HTN, hypothyroid,  history of sigmoid stricture status post partial colectomy and colostomy,  GI bleed , HLD, OA  Anticoag: Coumadin PTA for h/o PE. INR is therapeutic at 3.34. Hgb 14.3 >> 11.9 which may represent dilution, platelets are normal. - Coumadin dose PTA 5mg  TThSun, and 2.5mg  MWFSat  Goal of Therapy:  INR 2.5-3.5 per Coumadin Clinic record Monitor platelets by anticoagulation protocol: Yes   Plan:  Coumadin 2.5 mg PO tonight as per home dose Daily INR Monitor for s/sx of bleeding   Loura Back, PharmD, BCPS Clinical Pharmacist Clinical phone for  12/08/2017 until 3p is [phone] 12/08/2017 8:53 AM  **Pharmacist phone directory can now be found on [email or web address] listed under [facility]**

## 2017-12-10 DIAGNOSIS — N179 Acute kidney failure, unspecified: Secondary | ICD-10-CM | POA: Diagnosis not present

## 2017-12-10 DIAGNOSIS — M25552 Pain in left hip: Secondary | ICD-10-CM | POA: Diagnosis not present

## 2017-12-10 DIAGNOSIS — Z86711 Personal history of pulmonary embolism: Secondary | ICD-10-CM | POA: Diagnosis not present

## 2017-12-10 DIAGNOSIS — Z7901 Long term (current) use of anticoagulants: Secondary | ICD-10-CM | POA: Diagnosis not present

## 2017-12-10 DIAGNOSIS — Z96659 Presence of unspecified artificial knee joint: Secondary | ICD-10-CM | POA: Diagnosis not present

## 2017-12-10 DIAGNOSIS — M25551 Pain in right hip: Secondary | ICD-10-CM | POA: Diagnosis not present

## 2017-12-10 DIAGNOSIS — Z7984 Long term (current) use of oral hypoglycemic drugs: Secondary | ICD-10-CM | POA: Diagnosis not present

## 2017-12-10 DIAGNOSIS — I5032 Chronic diastolic (congestive) heart failure: Secondary | ICD-10-CM | POA: Diagnosis not present

## 2017-12-10 DIAGNOSIS — I110 Hypertensive heart disease with heart failure: Secondary | ICD-10-CM | POA: Diagnosis not present

## 2017-12-10 DIAGNOSIS — M1990 Unspecified osteoarthritis, unspecified site: Secondary | ICD-10-CM | POA: Diagnosis not present

## 2017-12-10 DIAGNOSIS — W1830XD Fall on same level, unspecified, subsequent encounter: Secondary | ICD-10-CM | POA: Diagnosis not present

## 2017-12-10 DIAGNOSIS — Z933 Colostomy status: Secondary | ICD-10-CM | POA: Diagnosis not present

## 2017-12-10 DIAGNOSIS — I700 Atherosclerosis of aorta: Secondary | ICD-10-CM | POA: Diagnosis not present

## 2017-12-10 DIAGNOSIS — Z87891 Personal history of nicotine dependence: Secondary | ICD-10-CM | POA: Diagnosis not present

## 2017-12-10 DIAGNOSIS — E785 Hyperlipidemia, unspecified: Secondary | ICD-10-CM | POA: Diagnosis not present

## 2017-12-10 DIAGNOSIS — Z96642 Presence of left artificial hip joint: Secondary | ICD-10-CM | POA: Diagnosis not present

## 2017-12-10 DIAGNOSIS — E1142 Type 2 diabetes mellitus with diabetic polyneuropathy: Secondary | ICD-10-CM | POA: Diagnosis not present

## 2017-12-12 ENCOUNTER — Telehealth: Payer: Self-pay | Admitting: Family Medicine

## 2017-12-12 NOTE — Telephone Encounter (Signed)
Copied from CRM [id number]. Topic: General - Other >> Dec 12, 2017  2:32 PM Ronney Lion A wrote: Reason for CRM: rebecca called in from [facility], she is requesting verbal orders for the pt to have PT 2x's a week for 3 weeks. >> Dec 12, 2017  2:38 PM Marquis Buggy A wrote: This is the wrong office, I am sending this to [facility].

## 2017-12-13 ENCOUNTER — Telehealth: Payer: Self-pay

## 2017-12-13 NOTE — Telephone Encounter (Signed)
I called Lurena Joiner and informed her the order was completed and faxed yesterday.  I also advised her the order was approved and the pt has a follow up appt next week.

## 2017-12-13 NOTE — Telephone Encounter (Signed)
This is a duplicate-see prior note.

## 2017-12-13 NOTE — Telephone Encounter (Signed)
Copied from CRM #[id number]. Topic: General - Other °>> Dec 12, 2017  2:32 PM Arrington, Shykila A wrote: °Reason for CRM: rebecca called in from [facility], she is requesting verbal orders for the pt to have PT 2x's a week for 3 weeks. °>> Dec 12, 2017  2:38 PM Overman, Brittany A wrote: °This is the wrong office, I am sending this to [facility].  °

## 2017-12-14 DIAGNOSIS — I700 Atherosclerosis of aorta: Secondary | ICD-10-CM | POA: Diagnosis not present

## 2017-12-14 DIAGNOSIS — Z7984 Long term (current) use of oral hypoglycemic drugs: Secondary | ICD-10-CM | POA: Diagnosis not present

## 2017-12-14 DIAGNOSIS — I110 Hypertensive heart disease with heart failure: Secondary | ICD-10-CM | POA: Diagnosis not present

## 2017-12-14 DIAGNOSIS — M1990 Unspecified osteoarthritis, unspecified site: Secondary | ICD-10-CM | POA: Diagnosis not present

## 2017-12-14 DIAGNOSIS — N179 Acute kidney failure, unspecified: Secondary | ICD-10-CM | POA: Diagnosis not present

## 2017-12-14 DIAGNOSIS — Z86711 Personal history of pulmonary embolism: Secondary | ICD-10-CM | POA: Diagnosis not present

## 2017-12-14 DIAGNOSIS — E785 Hyperlipidemia, unspecified: Secondary | ICD-10-CM | POA: Diagnosis not present

## 2017-12-14 DIAGNOSIS — Z933 Colostomy status: Secondary | ICD-10-CM | POA: Diagnosis not present

## 2017-12-14 DIAGNOSIS — Z96659 Presence of unspecified artificial knee joint: Secondary | ICD-10-CM | POA: Diagnosis not present

## 2017-12-14 DIAGNOSIS — Z7901 Long term (current) use of anticoagulants: Secondary | ICD-10-CM | POA: Diagnosis not present

## 2017-12-14 DIAGNOSIS — M25551 Pain in right hip: Secondary | ICD-10-CM | POA: Diagnosis not present

## 2017-12-14 DIAGNOSIS — Z96642 Presence of left artificial hip joint: Secondary | ICD-10-CM | POA: Diagnosis not present

## 2017-12-14 DIAGNOSIS — I5032 Chronic diastolic (congestive) heart failure: Secondary | ICD-10-CM | POA: Diagnosis not present

## 2017-12-14 DIAGNOSIS — Z87891 Personal history of nicotine dependence: Secondary | ICD-10-CM | POA: Diagnosis not present

## 2017-12-14 DIAGNOSIS — W1830XD Fall on same level, unspecified, subsequent encounter: Secondary | ICD-10-CM | POA: Diagnosis not present

## 2017-12-14 DIAGNOSIS — M25552 Pain in left hip: Secondary | ICD-10-CM | POA: Diagnosis not present

## 2017-12-14 DIAGNOSIS — E1142 Type 2 diabetes mellitus with diabetic polyneuropathy: Secondary | ICD-10-CM | POA: Diagnosis not present

## 2017-12-15 ENCOUNTER — Emergency Department (HOSPITAL_COMMUNITY): Payer: Medicare Other

## 2017-12-15 ENCOUNTER — Other Ambulatory Visit: Payer: Self-pay

## 2017-12-15 ENCOUNTER — Ambulatory Visit (INDEPENDENT_AMBULATORY_CARE_PROVIDER_SITE_OTHER)
Admission: EM | Admit: 2017-12-15 | Discharge: 2017-12-15 | Disposition: A | Discharge status: Home / Self Care | Payer: Medicare Other | Source: Home / Self Care

## 2017-12-15 ENCOUNTER — Inpatient Hospital Stay (HOSPITAL_COMMUNITY)
Admission: EM | Admit: 2017-12-15 | Discharge: 2017-12-18 | DRG: 600 | Disposition: A | Discharge status: Home Health | Payer: Medicare Other | Attending: Internal Medicine | Admitting: Internal Medicine

## 2017-12-15 ENCOUNTER — Encounter (HOSPITAL_COMMUNITY): Payer: Self-pay | Admitting: Emergency Medicine

## 2017-12-15 ENCOUNTER — Encounter (HOSPITAL_COMMUNITY): Payer: Self-pay

## 2017-12-15 DIAGNOSIS — N61 Mastitis without abscess: Secondary | ICD-10-CM | POA: Diagnosis not present

## 2017-12-15 DIAGNOSIS — N611 Abscess of the breast and nipple: Secondary | ICD-10-CM | POA: Diagnosis not present

## 2017-12-15 DIAGNOSIS — Z7984 Long term (current) use of oral hypoglycemic drugs: Secondary | ICD-10-CM | POA: Diagnosis not present

## 2017-12-15 DIAGNOSIS — D62 Acute posthemorrhagic anemia: Secondary | ICD-10-CM | POA: Diagnosis present

## 2017-12-15 DIAGNOSIS — Z9049 Acquired absence of other specified parts of digestive tract: Secondary | ICD-10-CM

## 2017-12-15 DIAGNOSIS — I10 Essential (primary) hypertension: Secondary | ICD-10-CM | POA: Diagnosis present

## 2017-12-15 DIAGNOSIS — E1140 Type 2 diabetes mellitus with diabetic neuropathy, unspecified: Secondary | ICD-10-CM | POA: Diagnosis not present

## 2017-12-15 DIAGNOSIS — Z79899 Other long term (current) drug therapy: Secondary | ICD-10-CM | POA: Diagnosis not present

## 2017-12-15 DIAGNOSIS — L0390 Cellulitis, unspecified: Secondary | ICD-10-CM | POA: Diagnosis not present

## 2017-12-15 DIAGNOSIS — R791 Abnormal coagulation profile: Secondary | ICD-10-CM | POA: Diagnosis not present

## 2017-12-15 DIAGNOSIS — N6489 Other specified disorders of breast: Secondary | ICD-10-CM | POA: Diagnosis not present

## 2017-12-15 DIAGNOSIS — K56699 Other intestinal obstruction unspecified as to partial versus complete obstruction: Secondary | ICD-10-CM | POA: Diagnosis not present

## 2017-12-15 DIAGNOSIS — Z933 Colostomy status: Secondary | ICD-10-CM

## 2017-12-15 DIAGNOSIS — Z880 Allergy status to penicillin: Secondary | ICD-10-CM | POA: Diagnosis not present

## 2017-12-15 DIAGNOSIS — R000 Tachycardia, unspecified: Secondary | ICD-10-CM | POA: Diagnosis not present

## 2017-12-15 DIAGNOSIS — Z881 Allergy status to other antibiotic agents status: Secondary | ICD-10-CM

## 2017-12-15 DIAGNOSIS — N63 Unspecified lump in unspecified breast: Secondary | ICD-10-CM

## 2017-12-15 DIAGNOSIS — Z7901 Long term (current) use of anticoagulants: Secondary | ICD-10-CM | POA: Diagnosis not present

## 2017-12-15 DIAGNOSIS — Z888 Allergy status to other drugs, medicaments and biological substances status: Secondary | ICD-10-CM | POA: Diagnosis not present

## 2017-12-15 DIAGNOSIS — E038 Other specified hypothyroidism: Secondary | ICD-10-CM | POA: Diagnosis not present

## 2017-12-15 DIAGNOSIS — S2002XA Contusion of left breast, initial encounter: Secondary | ICD-10-CM

## 2017-12-15 DIAGNOSIS — N6002 Solitary cyst of left breast: Secondary | ICD-10-CM

## 2017-12-15 DIAGNOSIS — R9431 Abnormal electrocardiogram [ECG] [EKG]: Secondary | ICD-10-CM | POA: Diagnosis not present

## 2017-12-15 DIAGNOSIS — Z96642 Presence of left artificial hip joint: Secondary | ICD-10-CM | POA: Diagnosis not present

## 2017-12-15 DIAGNOSIS — Z8674 Personal history of sudden cardiac arrest: Secondary | ICD-10-CM | POA: Diagnosis not present

## 2017-12-15 DIAGNOSIS — N179 Acute kidney failure, unspecified: Secondary | ICD-10-CM | POA: Diagnosis not present

## 2017-12-15 DIAGNOSIS — L03111 Cellulitis of right axilla: Secondary | ICD-10-CM | POA: Diagnosis not present

## 2017-12-15 DIAGNOSIS — R112 Nausea with vomiting, unspecified: Secondary | ICD-10-CM

## 2017-12-15 DIAGNOSIS — Z86711 Personal history of pulmonary embolism: Secondary | ICD-10-CM

## 2017-12-15 DIAGNOSIS — E785 Hyperlipidemia, unspecified: Secondary | ICD-10-CM | POA: Diagnosis present

## 2017-12-15 DIAGNOSIS — E1149 Type 2 diabetes mellitus with other diabetic neurological complication: Secondary | ICD-10-CM | POA: Diagnosis present

## 2017-12-15 DIAGNOSIS — E039 Hypothyroidism, unspecified: Secondary | ICD-10-CM | POA: Diagnosis present

## 2017-12-15 DIAGNOSIS — R609 Edema, unspecified: Secondary | ICD-10-CM

## 2017-12-15 DIAGNOSIS — R079 Chest pain, unspecified: Secondary | ICD-10-CM | POA: Diagnosis not present

## 2017-12-15 DIAGNOSIS — I160 Hypertensive urgency: Secondary | ICD-10-CM | POA: Diagnosis present

## 2017-12-15 LAB — CBC WITH DIFFERENTIAL/PLATELET
Abs Immature Granulocytes: 0.06 10*3/uL (ref 0.00–0.07)
Basophils Absolute: 0.1 10*3/uL (ref 0.0–0.1)
Basophils Relative: 0 %
Eosinophils Absolute: 0.1 10*3/uL (ref 0.0–0.5)
Eosinophils Relative: 1 %
HCT: 38.2 % (ref 36.0–46.0)
Hemoglobin: 11.5 g/dL — ABNORMAL LOW (ref 12.0–15.0)
IMMATURE GRANULOCYTES: 0 %
Lymphocytes Relative: 23 %
Lymphs Abs: 3.6 10*3/uL (ref 0.7–4.0)
MCH: 27.3 pg (ref 26.0–34.0)
MCHC: 30.1 g/dL (ref 30.0–36.0)
MCV: 90.7 fL (ref 80.0–100.0)
Monocytes Absolute: 1 10*3/uL (ref 0.1–1.0)
Monocytes Relative: 6 %
NEUTROS PCT: 70 %
Neutro Abs: 10.8 10*3/uL — ABNORMAL HIGH (ref 1.7–7.7)
Platelets: 307 10*3/uL (ref 150–400)
RBC: 4.21 MIL/uL (ref 3.87–5.11)
RDW: 15.5 % (ref 11.5–15.5)
WBC: 15.6 10*3/uL — ABNORMAL HIGH (ref 4.0–10.5)
nRBC: 0 % (ref 0.0–0.2)

## 2017-12-15 LAB — I-STAT CHEM 8, ED
BUN: 28 mg/dL — ABNORMAL HIGH (ref 8–23)
Calcium, Ion: 1.15 mmol/L (ref 1.15–1.40)
Chloride: 106 mmol/L (ref 98–111)
Creatinine, Ser: 1.3 mg/dL — ABNORMAL HIGH (ref 0.44–1.00)
Glucose, Bld: 143 mg/dL — ABNORMAL HIGH (ref 70–99)
HCT: 38 % (ref 36.0–46.0)
Hemoglobin: 12.9 g/dL (ref 12.0–15.0)
Potassium: 3.8 mmol/L (ref 3.5–5.1)
Sodium: 138 mmol/L (ref 135–145)
TCO2: 25 mmol/L (ref 22–32)

## 2017-12-15 LAB — GLUCOSE, CAPILLARY: GLUCOSE-CAPILLARY: 115 mg/dL — AB (ref 70–99)

## 2017-12-15 LAB — I-STAT TROPONIN, ED: Troponin i, poc: 0.02 ng/mL (ref 0.00–0.08)

## 2017-12-15 LAB — PROTIME-INR
INR: 3.48
Prothrombin Time: 34.5 seconds — ABNORMAL HIGH (ref 11.4–15.2)

## 2017-12-15 LAB — I-STAT CG4 LACTIC ACID, ED
Lactic Acid, Venous: 2.27 mmol/L (ref 0.5–1.9)
Lactic Acid, Venous: 0.86 mmol/L (ref 0.5–1.9)

## 2017-12-15 MED ORDER — POLYETHYLENE GLYCOL 3350 17 G PO PACK
17.0000 g | PACK | ORAL | Status: DC
  Administered 2017-12-17: 17 g via ORAL
  Filled 2017-12-15: qty 1

## 2017-12-15 MED ORDER — VANCOMYCIN HCL 10 G IV SOLR
1500.0000 mg | INTRAVENOUS | Status: DC
  Administered 2017-12-15: 1500 mg via INTRAVENOUS
  Filled 2017-12-15: qty 1500

## 2017-12-15 MED ORDER — SODIUM CHLORIDE 0.9 % IV BOLUS
500.0000 mL | Freq: Once | INTRAVENOUS | Status: AC
  Administered 2017-12-15: 500 mL via INTRAVENOUS

## 2017-12-15 MED ORDER — TRAMADOL HCL 50 MG PO TABS
50.0000 mg | ORAL_TABLET | Freq: Four times a day (QID) | ORAL | Status: DC | PRN
  Administered 2017-12-17 (×2): 50 mg via ORAL
  Filled 2017-12-15 (×2): qty 1

## 2017-12-15 MED ORDER — ACETAMINOPHEN 650 MG RE SUPP: 650.0000 mg | Freq: Four times a day (QID) | RECTAL | Status: DC | PRN

## 2017-12-15 MED ORDER — ACETAMINOPHEN 325 MG PO TABS
650.0000 mg | ORAL_TABLET | Freq: Once | ORAL | Status: AC
  Administered 2017-12-15: 650 mg via ORAL
  Filled 2017-12-15: qty 2

## 2017-12-15 MED ORDER — INSULIN ASPART 100 UNIT/ML ~~LOC~~ SOLN
0.0000 [IU] | Freq: Three times a day (TID) | SUBCUTANEOUS | Status: DC
  Administered 2017-12-16 – 2017-12-17 (×2): 1 [IU] via SUBCUTANEOUS

## 2017-12-15 MED ORDER — SIMVASTATIN 20 MG PO TABS
20.0000 mg | ORAL_TABLET | Freq: Every day | ORAL | Status: DC
  Administered 2017-12-15 – 2017-12-17 (×3): 20 mg via ORAL
  Filled 2017-12-15 (×3): qty 1

## 2017-12-15 MED ORDER — LISINOPRIL 40 MG PO TABS: 40.0000 mg | ORAL_TABLET | Freq: Every day | ORAL | Status: DC

## 2017-12-15 MED ORDER — LEVOTHYROXINE SODIUM 25 MCG PO TABS
25.0000 ug | ORAL_TABLET | Freq: Every day | ORAL | Status: DC
  Administered 2017-12-16 – 2017-12-18 (×3): 25 ug via ORAL
  Filled 2017-12-15 (×3): qty 1

## 2017-12-15 MED ORDER — VITAMIN K1 10 MG/ML IJ SOLN
5.0000 mg | Freq: Once | INTRAVENOUS | Status: AC
  Administered 2017-12-16: 5 mg via INTRAVENOUS
  Filled 2017-12-15 (×2): qty 0.5

## 2017-12-15 MED ORDER — INSULIN ASPART 100 UNIT/ML ~~LOC~~ SOLN: 0.0000 [IU] | Freq: Every day | SUBCUTANEOUS | Status: DC

## 2017-12-15 MED ORDER — METOPROLOL TARTRATE 25 MG PO TABS
25.0000 mg | ORAL_TABLET | Freq: Two times a day (BID) | ORAL | Status: DC
  Administered 2017-12-15 – 2017-12-18 (×6): 25 mg via ORAL
  Filled 2017-12-15 (×6): qty 1

## 2017-12-15 MED ORDER — IOHEXOL 300 MG/ML  SOLN
75.0000 mL | Freq: Once | INTRAMUSCULAR | Status: AC | PRN
  Administered 2017-12-15: 75 mL via INTRAVENOUS

## 2017-12-15 MED ORDER — ACETAMINOPHEN 325 MG PO TABS
650.0000 mg | ORAL_TABLET | Freq: Four times a day (QID) | ORAL | Status: DC | PRN
  Administered 2017-12-16 – 2017-12-18 (×4): 650 mg via ORAL
  Filled 2017-12-15 (×4): qty 2

## 2017-12-15 MED ORDER — VANCOMYCIN HCL 10 G IV SOLR
1500.0000 mg | Freq: Once | INTRAVENOUS | Status: DC
  Filled 2017-12-15: qty 1500

## 2017-12-15 MED ORDER — FENTANYL CITRATE (PF) 100 MCG/2ML IJ SOLN
50.0000 ug | Freq: Once | INTRAMUSCULAR | Status: AC
  Administered 2017-12-15: 50 ug via INTRAVENOUS
  Filled 2017-12-15: qty 2

## 2017-12-15 NOTE — Progress Notes (Signed)
Pharmacy Antibiotic Note  Hannah [name] is a 77 y.o. female admitted on 12/15/2017 with cellulitis.  Pharmacy has been consulted for vancomycin dosing.  Plan: Give vancomycin 1.5g IV x 1 then start vancomycin 1,500mg  IV Q24h Monitor clinical picture, renal function, vanc levels prn F/U C&S, abx deescalation / LOT   Height: 5\' 7"  (170.2 cm) Weight: 172 lb (78 kg) IBW/kg (Calculated) : 61.6  Temp (24hrs), Avg:98.7 F (37.1 C), Min:98.4 F (36.9 C), Max:99 F (37.2 C)  Recent Labs  Lab 12/15/17 1718 12/15/17 1800  WBC 15.6*  --   CREATININE  --  1.30*  LATICACIDVEN  --  2.27*    Estimated Creatinine Clearance: 39.6 mL/min (A) (by C-G formula based on SCr of 1.3 mg/dL (H)).    Allergies  Allergen Reactions  . Augmentin [Amoxicillin-Pot Clavulanate] Other (See Comments)    Severe vaginal itching  . Tranxene [Clorazepate] Itching  . Penicillins Itching and Rash    Has patient had a PCN reaction causing immediate rash, facial/tongue/throat swelling, SOB or lightheadedness with hypotension: Yes Has patient had a PCN reaction causing severe rash involving mucus membranes or skin necrosis: No Has patient had a PCN reaction that required hospitalization: No Has patient had a PCN reaction occurring within the last 10 years: No If all of the above answers are "NO", then may proceed with Cephalosporin use.    Thank you for allowing pharmacy to be a part of this patient's care.  Armandina Stammer 12/15/2017 6:27 PM

## 2017-12-15 NOTE — ED Triage Notes (Signed)
Pt presents to [facility] for assessment after a fall this week.  Noted bruising to her left breast.  States today it is becoming more swollen and tender to touch.

## 2017-12-15 NOTE — ED Provider Notes (Signed)
[facility] EMERGENCY DEPARTMENT Provider Note   CSN: [id number] Arrival date & time: 12/15/17  1510     History   Chief Complaint Chief Complaint  Patient presents with  . Tachycardia    HPI Hannah [name] is a 78 y.o. female with PMH PE with IVC since 2014 on coumadin, HTN, TIIDM, HFpEF and recent fall on 12/5 presenting today with tachycardia, left chest wall swelling, purple discoloration and pain 8/10 that started yesterday and was not present after her initial fall. She denies chest pain, SOB, sensation of palpitations, back pain or dyspnea. She endorses nausea but has not had any vomiting.   HPI  Past Medical History:  Diagnosis Date  . Acute massive pulmonary embolism (HCC) 07/13/2012   Massive PE w/ PEA arrest 07/13/12 >TNK >IVC filter >discharged on comadin    . Diabetes mellitus, type 2 (HCC) 07/15/2012   with peripheral neuropathy  . Hyperlipemia 07/14/2012  . Hypertension 07/14/2012  . Large bowel stricture (HCC)    s/p colectomy in 2016  . Osteoarthritis    s/p hip and knee replacements  . Thyroid disease     Patient Active Problem List   Diagnosis Date Noted  . Cellulitis of left breast 12/15/2017  . Large bowel stricture (HCC) 12/15/2017  . Fall at home, initial encounter 12/07/2017  . Acute lower UTI 12/07/2017  . UTI (urinary tract infection) 12/07/2017  . AKI (acute kidney injury) (HCC) 12/07/2017  . Leucocytosis 12/07/2017  . Chronic diastolic CHF (congestive heart failure) (HCC) 03/15/2017  . Acute GI bleeding 03/15/2017  . Anticoagulated on warfarin   . Pulmonary hypertension (HCC) 07/26/2016  . GI bleed 03/10/2016  . History of DVT in adulthood 03/10/2016  . Acute blood loss anemia 03/10/2016  . Gastrointestinal hemorrhage   . Diabetes mellitus with complication (HCC)   . Encounter for therapeutic drug monitoring 05/04/2015  . Neuropathy 02/12/2015  . Osteoarthritis 08/12/2014  . Normocytic anemia 06/08/2014  . History of  pulmonary embolism 06/10/2013  . Hypothyroid 06/06/2013  . Essential hypertension 06/06/2013  . Diabetes mellitus with neurological manifestations, controlled (HCC) 06/06/2013  . Long term current use of anticoagulant therapy 07/27/2012    Past Surgical History:  Procedure Laterality Date  . COLONOSCOPY N/A 03/12/2016   Procedure: COLONOSCOPY;  Surgeon: Dorena Cookey, MD;  Location[facility] ENDOSCOPY;  Service: Endoscopy;  Laterality: N/A;  . COLOSTOMY N/A 06/03/2014   Procedure: COLOSTOMY;  Surgeon: Harriette Bouillon, MD;  Location: [facility] OR;  Service: General;  Laterality: N/A;  . FLEXIBLE SIGMOIDOSCOPY N/A 05/30/2014   Procedure: Arnell Sieving;  Surgeon: Jeani Hawking, MD;  Location: [facility] ENDOSCOPY;  Service: Endoscopy;  Laterality: N/A;  . INSERTION OF VENA CAVA FILTER N/A 07/16/2012   Procedure: INSERTION OF VENA CAVA FILTER;  Surgeon: [name], MD;  Location: [facility] CATH LAB;  Service: Cardiovascular;  Laterality: N/A;  . KNEE ARTHROSCOPY    . PARTIAL COLECTOMY N/A 06/03/2014   Procedure: PARTIAL COLECTOMY;  Surgeon: Harriette Bouillon, MD;  Location: [facility] OR;  Service: General;  Laterality: N/A;  . REDUCTION MAMMAPLASTY Bilateral   . SP ARTHRO HIP*L*       OB History   No obstetric history on file.      Home Medications    Prior to Admission medications   Medication Sig Start Date End Date Taking? Authorizing Provider  acetaminophen (TYLENOL) 500 MG tablet Take 500 mg by mouth every 6 (six) hours as needed for mild pain.    Yes [provider]  furosemide (LASIX) 20 MG tablet Take 1 tablet (20 mg total) by mouth daily. Patient taking differently: Take 20 mg by mouth as needed for fluid or edema.  03/09/17  Yes [name], DO  levothyroxine (SYNTHROID, LEVOTHROID) 25 MCG tablet Take 1 tablet by mouth  daily before breakfast Patient taking differently: Take 25 mcg by mouth daily before breakfast.  09/19/17  Yes Kriste Basque R, DO  lisinopril (PRINIVIL,ZESTRIL) 40 MG tablet TAKE 1  TABLET BY MOUTH ONCE DAILY Patient taking differently: Take 40 mg by mouth daily.  10/20/17  Yes Kriste Basque R, DO  metoprolol tartrate (LOPRESSOR) 25 MG tablet Take 1 tablet (25 mg total) by mouth 2 (two) times daily. 06/09/17  Yes Kriste Basque R, DO  polyethylene glycol (MIRALAX / GLYCOLAX) packet Take 17 g by mouth daily. Patient taking differently: Take 17 g by mouth every other day.  03/19/17  Yes Zigmund Daniel., MD  simvastatin (ZOCOR) 20 MG tablet Take 1 tablet (20 mg total) by mouth at bedtime. 10/26/17  Yes [name], DO  traMADol (ULTRAM) 50 MG tablet Take 1 tablet (50 mg total) by mouth every 6 (six) hours as needed for moderate pain. 12/08/17  Yes Burnadette Pop, MD  vitamin C (ASCORBIC ACID) 500 MG tablet Take 500 mg by mouth daily.    Yes [provider]  warfarin (COUMADIN) 5 MG tablet Take as directed by [facility] Patient taking differently: Take 2.5-5 mg by mouth See admin instructions. Take 5 mg by mouth before supper on Sun/Tues/Thurs and 2.5 mg on Mon/Wed/Fri/Sat 09/20/17  Yes Pricilla Riffle, MD  glucose blood [facility] VERIO) test strip Use as instructed once a day 09/19/17   Kriste Basque R, DO  glucose blood test strip Use as instructed once a day 05/26/17   [name], DO  metFORMIN (GLUCOPHAGE) 1000 MG tablet TAKE ONE TABLET BY MOUTH WITH BREAKFAST AND TAKE ONE TABLET WITH DINNER Patient taking differently: Take 1,000 mg by mouth 2 (two) times daily with a meal.  11/22/17   Kim, Damita Lack, DO  ONETOUCH DELICA LANCETS FINE MISC USE ONCE DAILY. 05/18/17   [name], DO    Family History Family History  Problem Relation Age of Onset  . Breast cancer Mother     Social History Social History   Tobacco Use  . Smoking status: Former Smoker    Packs/day: 1.00    Years: 10.00    Pack years: 10.00    Types: Cigarettes    Last attempt to quit: 01/04/1968    Years since quitting: 49.9  . Smokeless tobacco: Never Used  Substance Use Topics  .  Alcohol use: No  . Drug use: No     Allergies   Augmentin [amoxicillin-pot clavulanate]; Tranxene [clorazepate]; and Penicillins   Review of Systems Review of Systems Review of systems reviewed and are negative for acute change, except as noted in the HPI.    Physical Exam Updated Vital Signs BP (!) 156/71   Pulse 100   Temp (S) 98.5 F (36.9 C) (Rectal)   Resp 18   Ht 5\' 7"  (1.702 m)   Wt 78 kg   SpO2 100%   BMI 26.94 kg/m   Physical Exam Constitutional:      General: She is awake. She is not in acute distress.    Appearance: She is well-developed.  HENT:     Head: Normocephalic and atraumatic.  Cardiovascular:     Rate and Rhythm:  Tachycardia present.     Pulses: Normal pulses.     Heart sounds: Normal heart sounds. No murmur. No friction rub. No gallop.   Pulmonary:     Effort: Pulmonary effort is normal.     Breath sounds: Normal breath sounds. No wheezing, rhonchi or rales.  Chest:     Chest wall: Swelling (Left chest wall ecchymosis and induration including the lateral left breast, TTP), tenderness and edema present.  Abdominal:     General: There is no distension.     Palpations: Abdomen is soft.     Tenderness: There is no abdominal tenderness.  Musculoskeletal:     Right lower leg: No edema.     Left lower leg: No edema.  Skin:    General: Skin is warm and dry.  Neurological:     Mental Status: She is alert and oriented to person, place, and time.     ED Treatments / Results  Labs (all labs ordered are listed, but only abnormal results are displayed) Labs Reviewed  CBC WITH DIFFERENTIAL/PLATELET - Abnormal; Notable for the following components:      Result Value   WBC 15.6 (*)    Hemoglobin 11.5 (*)    Neutro Abs 10.8 (*)    All other components within normal limits  PROTIME-INR - Abnormal; Notable for the following components:   Prothrombin Time 34.5 (*)    All other components within normal limits  I-STAT CG4 LACTIC ACID, ED - Abnormal;  Notable for the following components:   Lactic Acid, Venous 2.27 (*)    All other components within normal limits  I-STAT CHEM 8, ED - Abnormal; Notable for the following components:   BUN 28 (*)    Creatinine, Ser 1.30 (*)    Glucose, Bld 143 (*)    All other components within normal limits  PROTIME-INR  CBC  BASIC METABOLIC PANEL  I-STAT TROPONIN, ED  I-STAT CG4 LACTIC ACID, ED    EKG EKG Interpretation  Date/Time:  Friday December 15 2017 15:25:27 EST Ventricular Rate:  133 PR Interval:  136 QRS Duration: 106 QT Interval:  302 QTC Calculation: 449 R Axis:   -57 Text Interpretation:  Sinus tachycardia Left anterior fascicular block Left ventricular hypertrophy with repolarization abnormality Cannot rule out Septal infarct , age undetermined Abnormal ECG Confirmed by Blane Ohara [id number]) on 12/15/2017 3:32:16 PM   Radiology Dg Chest 2 View  Result Date: 12/15/2017 CLINICAL DATA:  Bruising under the left breast EXAM: CHEST - 2 VIEW COMPARISON:  12/07/2017 FINDINGS: The heart size and mediastinal contours are within normal limits. Both lungs are clear. Degenerative osteophytes of the spine. IMPRESSION: No active cardiopulmonary disease. Electronically Signed   By: Jasmine Pang M.D.   On: 12/15/2017 18:20   Ct Chest W Contrast  Result Date: 12/15/2017 CLINICAL DATA:  Axillary cellulitis, pain starting last night EXAM: CT CHEST WITH CONTRAST TECHNIQUE: Multidetector CT imaging of the chest was performed during intravenous contrast administration. CONTRAST:  75mL OMNIPAQUE IOHEXOL 300 MG/ML  SOLN COMPARISON:  CT scan 08/05/2012 chest radiograph ball/13/19 FINDINGS: Cardiovascular: Coronary, aortic arch, and branch vessel atherosclerotic vascular disease. Mild calcification of the posterior leaflet of the mitral valve. Mediastinum/Nodes: Unremarkable Lungs/Pleura: Bandlike density in the right lower lobe is thought to be due to atelectasis rather than a true pulmonary nodule. Upper  Abdomen: Hypodense lesion in the right kidney upper pole is technically nonspecific although statistically likely to be a benign cyst. Mild nodularity left adrenal gland is similar to  the 10/24/2017 exam but otherwise nonspecific. Musculoskeletal: In the left breast we partially image a proximally 13.2 by 4.9 by 6.0 cm (volume = 200 cm^3) complex fluid collection with dependent higher density components and some suspected internal septation. The upper margin of this process extends slightly above the top image of today's CT scan. This is vertically oriented and appears to partially extend between the pectoralis minor and major muscle laterally on the left, and also involve the axilla. There is considerable surrounding edema in the left breast and axilla and tracking down the left chest wall. Thoracic spondylosis noted with multilevel bridging spurring. I do not see a discrete rib fracture. IMPRESSION: 1. At least 200 cubic cm complex fluid collection in the left lateral breast oriented vertically. This appears to have some sort of internal fluid-fluid level possibly a hematoma, conceivably a breast abscess. There is notable surrounding subcutaneous edema in the breast and axilla. Please note that the upper margin of this process is not completely included on today's exam. While I am skeptical that this lesion is directly related to the axillary artery or vein, the segments of these vessels which would be close to the mass are not included on imaging of today's chest CT as they are above the axis of the top most image. If clinically warranted, sonography could be utilized to further assess the upper margin of the breast lesion particularly in relation to the axillary artery and vein. However, if the patient's diagnosis is clinically obvious, for example an obvious mastitis with fluctuant abscess, then sonographic workup may not be indicated. 2. Aortic Atherosclerosis (ICD10-I70.0). Coronary atherosclerosis. Mild  mitral valve calcification. Electronically Signed   By: Gaylyn Rong M.D.   On: 12/15/2017 20:26    Procedures Procedures (including critical care time)  Medications Ordered in ED Medications  vancomycin (VANCOCIN) 1,500 mg in sodium chloride 0.9 % 500 mL IVPB (1,500 mg Intravenous New Bag/Given 12/15/17 2008)  phytonadione (VITAMIN K) 5 mg in dextrose 5 % 50 mL IVPB (has no administration in time range)  acetaminophen (TYLENOL) tablet 650 mg (has no administration in time range)    Or  acetaminophen (TYLENOL) suppository 650 mg (has no administration in time range)  levothyroxine (SYNTHROID, LEVOTHROID) tablet 25 mcg (has no administration in time range)  lisinopril (PRINIVIL,ZESTRIL) tablet 40 mg (has no administration in time range)  metoprolol tartrate (LOPRESSOR) tablet 25 mg (has no administration in time range)  polyethylene glycol (MIRALAX / GLYCOLAX) packet 17 g (has no administration in time range)  simvastatin (ZOCOR) tablet 20 mg (has no administration in time range)  traMADol (ULTRAM) tablet 50 mg (has no administration in time range)  insulin aspart (novoLOG) injection 0-9 Units (has no administration in time range)  insulin aspart (novoLOG) injection 0-5 Units (has no administration in time range)  sodium chloride 0.9 % bolus 500 mL (0 mLs Intravenous Stopped 12/15/17 1841)  fentaNYL (SUBLIMAZE) injection 50 mcg (50 mcg Intravenous Given 12/15/17 1901)  iohexol (OMNIPAQUE) 300 MG/ML solution 75 mL (75 mLs Intravenous Contrast Given 12/15/17 1940)  acetaminophen (TYLENOL) tablet 650 mg (650 mg Oral Given 12/15/17 2101)     Initial Impression / Assessment and Plan / ED Course  I have reviewed the triage vital signs and the nursing notes.  Pertinent labs & imaging results that were available during my care of the patient were reviewed by me and considered in my medical decision making (see chart for details).  Clinical Course as of Dec 16 2202  Fri Dec  71, 2019    1750 77 yo female with history of recent fall on 12/5 on coumadin for history of PE presenting with tachycardia and left chest wall swelling concerning for hematoma with possible cellulitis extending into the left breast. Will obtain labs and CT scan.    [JS]    Clinical Course User Index [JS] Aileena Iglesia A, DO   CT concerning for hematoma vs. Left breast abscess but unable to visualize up to the axillary artery and vein. With her tachycardia, lactic acidosis and possible cellulitis she was given antibiotics and medicine called for admission for further workup. INR 3.4 and INR reversal started as well as she will likely need IR or surgery consult pending further findings with [place].   Final Clinical Impressions(s) / ED Diagnoses   Final diagnoses:  Swelling  Tachycardia  Breast swelling  Hematoma of breast    ED Discharge Orders    None       Versie Starks, DO 12/15/17 2204    Blane Ohara, MD 12/17/17 [phone]

## 2017-12-15 NOTE — Discharge Instructions (Addendum)
D/c to ED 

## 2017-12-15 NOTE — ED Triage Notes (Signed)
Per pt, went to urgent care for 8/10 pain and redness to left breast and axilla. Was found to be tachy

## 2017-12-15 NOTE — ED Provider Notes (Addendum)
[facility]    CSN: [id number] Arrival date & time: 12/15/17  1340     History   Chief Complaint Chief Complaint  Patient presents with  . Breast Pain    HPI Hannah [name] is a 77 y.o. female history of PE on Coumadin, DM type II, hypertension, hyperlipidemia, large bowel stricture status post colectomy with colostomy bag presenting today for evaluation of breast bruising and tenderness, lower back pain and nausea and vomiting.  Patient had a fall approximately 9 to 10 days ago.  She was seen in the emergency room afterward on 12/5.  She had multiple imaging done.  Since she has had worsening pain to her left breast with bruising and tenderness.  The tenderness is come up significantly over the past day or 2.  Last INR was 1 week ago and was 3.34.  She denies other chest pain unrelated to tenderness.  She denies shortness of breath.  She denies abdominal pain.  She has noted some nausea and vomiting.  She is concerned about her colostomy bag related to this.  She does not typically have nausea and vomiting.  The nausea and vomiting is new since she has last been evaluated.  She denies vision changes, lightheadedness or dizziness.  HPI  Past Medical History:  Diagnosis Date  . Acute massive pulmonary embolism (HCC) 07/13/2012   Massive PE w/ PEA arrest 07/13/12 >TNK >IVC filter >discharged on comadin    . Diabetes mellitus, type 2 (HCC) 07/15/2012   with peripheral neuropathy  . Hyperlipemia 07/14/2012  . Hypertension 07/14/2012  . Large bowel stricture (HCC)    s/p colectomy in 2016  . Osteoarthritis    s/p hip and knee replacements  . Thyroid disease     Patient Active Problem List   Diagnosis Date Noted  . Fall at home, initial encounter 12/07/2017  . Acute lower UTI 12/07/2017  . UTI (urinary tract infection) 12/07/2017  . AKI (acute kidney injury) (HCC) 12/07/2017  . Leucocytosis 12/07/2017  . Chronic diastolic CHF (congestive heart failure) (HCC) 03/15/2017  .  Acute GI bleeding 03/15/2017  . Anticoagulated on warfarin   . Pulmonary hypertension (HCC) 07/26/2016  . GI bleed 03/10/2016  . History of DVT in adulthood 03/10/2016  . Acute blood loss anemia 03/10/2016  . Gastrointestinal hemorrhage   . Diabetes mellitus with complication (HCC)   . Encounter for therapeutic drug monitoring 05/04/2015  . Neuropathy 02/12/2015  . Osteoarthritis 08/12/2014  . Normocytic anemia 06/08/2014  . History of pulmonary embolism 06/10/2013  . Hypothyroid 06/06/2013  . Essential hypertension 06/06/2013  . Diabetes mellitus with neurological manifestations, controlled (HCC) 06/06/2013  . Long term current use of anticoagulant therapy 07/27/2012    Past Surgical History:  Procedure Laterality Date  . COLONOSCOPY N/A 03/12/2016   Procedure: COLONOSCOPY;  Surgeon: Dorena Cookey, MD;  Location: [facility] ENDOSCOPY;  Service: Endoscopy;  Laterality: N/A;  . COLOSTOMY N/A 06/03/2014   Procedure: COLOSTOMY;  Surgeon: Harriette Bouillon, MD;  Location: [facility] OR;  Service: General;  Laterality: N/A;  . FLEXIBLE SIGMOIDOSCOPY N/A 05/30/2014   Procedure: Arnell Sieving;  Surgeon: Jeani Hawking, MD;  Location: [facility] ENDOSCOPY;  Service: Endoscopy;  Laterality: N/A;  . INSERTION OF VENA CAVA FILTER N/A 07/16/2012   Procedure: INSERTION OF VENA CAVA FILTER;  Surgeon: [name], MD;  Location: [facility] CATH LAB;  Service: Cardiovascular;  Laterality: N/A;  . KNEE ARTHROSCOPY    . PARTIAL COLECTOMY N/A 06/03/2014   Procedure: PARTIAL COLECTOMY;  Surgeon: Maisie Fus  Cornett, MD;  Location: [facility] OR;  Service: General;  Laterality: N/A;  . REDUCTION MAMMAPLASTY Bilateral   . SP ARTHRO HIP*L*      OB History   No obstetric history on file.      Home Medications    Prior to Admission medications   Medication Sig Start Date End Date Taking? Authorizing Provider  acetaminophen (TYLENOL) 500 MG tablet Take 500 mg by mouth every 6 (six) hours as needed for mild pain.     [provider]  furosemide (LASIX) 20 MG tablet Take 1 tablet (20 mg total) by mouth daily. Patient taking differently: Take 20 mg by mouth as needed for fluid.  03/09/17   [name], DO  glucose blood (ONETOUCH VERIO) test strip Use as instructed once a day 09/19/17   Kriste Basque R, DO  glucose blood test strip Use as instructed once a day 05/26/17   [name], DO  levothyroxine (SYNTHROID, LEVOTHROID) 25 MCG tablet Take 1 tablet by mouth  daily before breakfast Patient taking differently: Take 25 mcg by mouth daily before breakfast.  09/19/17   [name], DO  lisinopril (PRINIVIL,ZESTRIL) 40 MG tablet TAKE 1 TABLET BY MOUTH ONCE DAILY Patient taking differently: Take 40 mg by mouth daily.  10/20/17   [name], DO  metFORMIN (GLUCOPHAGE) 1000 MG tablet TAKE ONE TABLET BY MOUTH WITH BREAKFAST AND TAKE ONE TABLET WITH DINNER Patient taking differently: Take 500 mg by mouth 2 (two) times daily with a meal.  11/22/17   [name], DO  metoprolol tartrate (LOPRESSOR) 25 MG tablet Take 1 tablet (25 mg total) by mouth 2 (two) times daily. 06/09/17   [name], DO  ONETOUCH DELICA LANCETS FINE MISC USE ONCE DAILY. 05/18/17   [name], DO  polyethylene glycol (MIRALAX / GLYCOLAX) packet Take 17 g by mouth daily. Patient taking differently: Take 17 g by mouth daily as needed for mild constipation.  03/19/17   Zigmund Daniel., MD  simvastatin (ZOCOR) 20 MG tablet Take 1 tablet (20 mg total) by mouth at bedtime. 10/26/17   [name], DO  traMADol (ULTRAM) 50 MG tablet Take 1 tablet (50 mg total) by mouth every 6 (six) hours as needed for moderate pain. 12/08/17   Burnadette Pop, MD  vitamin C (ASCORBIC ACID) 500 MG tablet Take 500 mg by mouth daily.     [provider]  warfarin (COUMADIN) 5 MG tablet Take as directed by [facility] Patient taking differently: Take 5 mg by mouth See admin instructions. Take one tablet (5MG ) by mouth on Sun, Tues, and Thursdays. All other days  take 1/2 tablet (2.5MG ) 09/20/17   Pricilla Riffle, MD    Family History Family History  Problem Relation Age of Onset  . Breast cancer Mother     Social History Social History   Tobacco Use  . Smoking status: Former Smoker    Packs/day: 1.00    Years: 10.00    Pack years: 10.00    Types: Cigarettes    Last attempt to quit: 01/04/1968    Years since quitting: 49.9  . Smokeless tobacco: Never Used  Substance Use Topics  . Alcohol use: No  . Drug use: No     Allergies   Augmentin [amoxicillin-pot clavulanate]; Tranxene [clorazepate]; and Penicillins   Review of Systems Review of Systems  Constitutional: Negative for fatigue and fever.  HENT: Negative for congestion, sinus pressure and sore  throat.   Eyes: Negative for photophobia, pain and visual disturbance.  Respiratory: Negative for cough and shortness of breath.   Cardiovascular: Positive for chest pain.  Gastrointestinal: Positive for nausea and vomiting. Negative for abdominal pain.  Genitourinary: Negative for decreased urine volume and hematuria.  Musculoskeletal: Positive for back pain and myalgias. Negative for neck pain and neck stiffness.  Neurological: Positive for headaches. Negative for dizziness, syncope, facial asymmetry, speech difficulty, weakness, light-headedness and numbness.     Physical Exam Triage Vital Signs ED Triage Vitals  Enc Vitals Group     BP 12/15/17 1417 (!) 154/119     Pulse Rate 12/15/17 1417 (!) 134     Resp 12/15/17 1417 18     Temp 12/15/17 1417 98.4 F (36.9 C)     Temp Source 12/15/17 1417 Oral     SpO2 12/15/17 1417 100 %     Weight --      Height --      Head Circumference --      Peak Flow --      Pain Score 12/15/17 1423 8     Pain Loc --      Pain Edu? --      Excl. in GC? --    No data found.  Updated Vital Signs BP (!) 154/119 (BP Location: Right Arm)   Pulse (!) 134   Temp 98.4 F (36.9 C) (Oral)   Resp 18   SpO2 100%   Visual Acuity Right Eye  Distance:   Left Eye Distance:   Bilateral Distance:    Right Eye Near:   Left Eye Near:    Bilateral Near:     Physical Exam Vitals signs and nursing note reviewed.  Constitutional:      General: She is not in acute distress.    Appearance: She is well-developed.     Comments: No acute distress  HENT:     Head: Normocephalic and atraumatic.  Eyes:     Conjunctiva/sclera: Conjunctivae normal.  Neck:     Musculoskeletal: Neck supple.  Cardiovascular:     Rate and Rhythm: Regular rhythm. Tachycardia present.     Heart sounds: No murmur.  Pulmonary:     Effort: Pulmonary effort is normal. No respiratory distress.     Breath sounds: Normal breath sounds.     Comments: Breathing comfortably at rest, CTABL, no wheezing, rales or other adventitious sounds auscultated Chest:     Chest wall: Deformity, tenderness and edema present.       Comments: Large dense tissue to left upper breast/left axilla overlying bruising extending onto the left breast and lower left rib cage Abdominal:     Palpations: Abdomen is soft.     Tenderness: There is no abdominal tenderness.     Comments: Colostomy bag to left lower quadrant, no surrounding erythema or tenderness, small amount of yellowish-brown stool in bag  Nontender to palpation of rest of abdomen   Musculoskeletal:     Comments: Nontender to palpation of cervical, thoracic spine.  Mild tenderness to lower lumbar/upper sacral area.  Skin:    General: Skin is warm and dry.  Neurological:     Mental Status: She is alert.      UC Treatments / Results  Labs (all labs ordered are listed, but only abnormal results are displayed) Labs Reviewed - No data to display  EKG None  EKG sinus tachycardia, no acute signs of ischemia or infarction  Radiology No results found.  Procedures  Procedures (including critical care time)  Medications Ordered in UC Medications - No data to display  Initial Impression / Assessment and Plan / UC  Course  I have reviewed the triage vital signs and the nursing notes.  Pertinent labs & imaging results that were available during my care of the patient were reviewed by me and considered in my medical decision making (see chart for details).    Discussed case with Dr. Dayton Scrape  77 year old female with multiple risk factors presenting today with tenderness to the left breast.  Likely related to Coumadin use and fall, but concerning given recent increase in tenderness for possible infection in combination with her nausea, vomiting and tachycardia.  Given significant tachycardia and risk factors recommending further evaluation and treatment in emergency room.  Patient stable upon discharge, discharged to care of friend and daughter who plan on taking her to the emergency room.  Discussed strict return precautions. Patient verbalized understanding and is agreeable with plan.   Final Clinical Impressions(s) / UC Diagnoses   Final diagnoses:  Posttraumatic hematoma of left breast, initial encounter  Nausea and vomiting, intractability of vomiting not specified, unspecified vomiting type  Tachycardia     Discharge Instructions     D/c to ED   ED Prescriptions    None     Controlled Substance Prescriptions  Controlled Substance Registry consulted? Not Applicable   Lew Dawes, PA-C 12/15/17 [address], [place], [place] 12/15/17 [phone]

## 2017-12-15 NOTE — ED Notes (Signed)
Patient transported to X-ray 

## 2017-12-15 NOTE — ED Notes (Signed)
Pt sent to ER by provider.

## 2017-12-15 NOTE — H&P (Signed)
History and Physical    Hannah [name] WGN:[id number] DOB: Mar 09, 1940 DOA: 12/15/2017  PCP: [name], DO   Patient coming from: Home  I have personally briefly reviewed patient's old medical records in [facility]  Chief Complaint: Left breast swelling  HPI: Hannah [name] is a 77 y.o. female with medical history significant for Hx of PE on Coumadin, T2DM, HTN, HLD, hypothyroidism, and large bowel stricture s/p colectomy with colostomy who presents to the ED with left breast swelling.  Patient was recently admitted from 12/07/17-12/08/2017 after a fall at home.  She was diagnosed and treated for UTI and discharged to home with home health physical therapy.  Last night she noticed swelling at her left breast with dark discoloration at the skin.  She is unsure if this was present in between her recent hospitalization and yesterday.  Today she noticed that the overlying skin turned red.  She has had associated pain without discharge or open wound.  She has been on Coumadin for history of PE with last dose taken last night.  She denies any fevers, chills, diaphoresis, chest pain, dyspnea, abdominal pain, or dysuria.  ED Course:  Initial vitals showed BP 154/119, pulse 134, RR 18, temp 98.7F, SPO2 100% on room air.  Labs are notable for WBC 15.6, hemoglobin 11.5, platelets 307, INR 3.48.  CT chest with contrast was obtained which showed a complex fluid collection in the left lateral breast at least 200 cm in size with internal fluid level possibly hematoma versus breast abscess.  Superior portion unable to be visualized on CT with regards to axillary artery or vein involvement.  She was given IV normal saline 500 mL's, vitamin K 5 mg IV, and started on vancomycin.  The hospitalist service was consulted to admit for further management.  Review of Systems: As per HPI otherwise 10 point review of systems negative.    Past Medical History:  Diagnosis Date  . Acute massive pulmonary embolism  (HCC) 07/13/2012   Massive PE w/ PEA arrest 07/13/12 >TNK >IVC filter >discharged on comadin    . Diabetes mellitus, type 2 (HCC) 07/15/2012   with peripheral neuropathy  . Hyperlipemia 07/14/2012  . Hypertension 07/14/2012  . Large bowel stricture (HCC)    s/p colectomy in 2016  . Osteoarthritis    s/p hip and knee replacements  . Thyroid disease     Past Surgical History:  Procedure Laterality Date  . COLONOSCOPY N/A 03/12/2016   Procedure: COLONOSCOPY;  Surgeon: Dorena Cookey, MD;  Location: [facility] ENDOSCOPY;  Service: Endoscopy;  Laterality: N/A;  . COLOSTOMY N/A 06/03/2014   Procedure: COLOSTOMY;  Surgeon: Harriette Bouillon, MD;  Location: [facility] OR;  Service: General;  Laterality: N/A;  . FLEXIBLE SIGMOIDOSCOPY N/A 05/30/2014   Procedure: Arnell Sieving;  Surgeon: Jeani Hawking, MD;  Location: [facility] ENDOSCOPY;  Service: Endoscopy;  Laterality: N/A;  . INSERTION OF VENA CAVA FILTER N/A 07/16/2012   Procedure: INSERTION OF VENA CAVA FILTER;  Surgeon: [name], MD;  Location: [facility] CATH LAB;  Service: Cardiovascular;  Laterality: N/A;  . KNEE ARTHROSCOPY    . PARTIAL COLECTOMY N/A 06/03/2014   Procedure: PARTIAL COLECTOMY;  Surgeon: Harriette Bouillon, MD;  Location: [facility] OR;  Service: General;  Laterality: N/A;  . REDUCTION MAMMAPLASTY Bilateral   . SP ARTHRO HIP*L*       reports that she quit smoking about 49 years ago. Her smoking use included cigarettes. She has a 10.00 pack-year smoking history. She has never used smokeless tobacco.  She reports that she does not drink alcohol or use drugs.  Allergies  Allergen Reactions  . Augmentin [Amoxicillin-Pot Clavulanate] Itching and Other (See Comments)    Severe vaginal itching  . Tranxene [Clorazepate] Itching  . Penicillins Itching and Rash    Has patient had a PCN reaction causing immediate rash, facial/tongue/throat swelling, SOB or lightheadedness with hypotension: Yes Has patient had a PCN reaction causing severe rash involving mucus  membranes or skin necrosis: No Has patient had a PCN reaction that required hospitalization: No Has patient had a PCN reaction occurring within the last 10 years: No If all of the above answers are "NO", then may proceed with Cephalosporin use.     Family History  Problem Relation Age of Onset  . Breast cancer Mother      Prior to Admission medications   Medication Sig Start Date End Date Taking? Authorizing Provider  acetaminophen (TYLENOL) 500 MG tablet Take 500 mg by mouth every 6 (six) hours as needed for mild pain.    Yes [provider]  furosemide (LASIX) 20 MG tablet Take 1 tablet (20 mg total) by mouth daily. Patient taking differently: Take 20 mg by mouth as needed for fluid or edema.  03/09/17  Yes [name], DO  levothyroxine (SYNTHROID, LEVOTHROID) 25 MCG tablet Take 1 tablet by mouth  daily before breakfast Patient taking differently: Take 25 mcg by mouth daily before breakfast.  09/19/17  Yes Kriste Basque R, DO  lisinopril (PRINIVIL,ZESTRIL) 40 MG tablet TAKE 1 TABLET BY MOUTH ONCE DAILY Patient taking differently: Take 40 mg by mouth daily.  10/20/17  Yes Kriste Basque R, DO  metoprolol tartrate (LOPRESSOR) 25 MG tablet Take 1 tablet (25 mg total) by mouth 2 (two) times daily. 06/09/17  Yes Kriste Basque R, DO  polyethylene glycol (MIRALAX / GLYCOLAX) packet Take 17 g by mouth daily. Patient taking differently: Take 17 g by mouth every other day.  03/19/17  Yes Zigmund Daniel., MD  simvastatin (ZOCOR) 20 MG tablet Take 1 tablet (20 mg total) by mouth at bedtime. 10/26/17  Yes [name], DO  traMADol (ULTRAM) 50 MG tablet Take 1 tablet (50 mg total) by mouth every 6 (six) hours as needed for moderate pain. 12/08/17  Yes Burnadette Pop, MD  vitamin C (ASCORBIC ACID) 500 MG tablet Take 500 mg by mouth daily.    Yes [provider]  warfarin (COUMADIN) 5 MG tablet Take as directed by [facility] Patient taking differently: Take 2.5-5 mg by mouth See  admin instructions. Take 5 mg by mouth before supper on Sun/Tues/Thurs and 2.5 mg on Mon/Wed/Fri/Sat 09/20/17  Yes Pricilla Riffle, MD  glucose blood [facility] VERIO) test strip Use as instructed once a day 09/19/17   Kriste Basque R, DO  glucose blood test strip Use as instructed once a day 05/26/17   [name], DO  metFORMIN (GLUCOPHAGE) 1000 MG tablet TAKE ONE TABLET BY MOUTH WITH BREAKFAST AND TAKE ONE TABLET WITH DINNER Patient taking differently: Take 1,000 mg by mouth 2 (two) times daily with a meal.  11/22/17   Kim, Damita Lack, DO  ONETOUCH DELICA LANCETS FINE MISC USE ONCE DAILY. 05/18/17   [name], DO    Physical Exam: Vitals:   12/15/17 2145 12/15/17 2200 12/15/17 2230 12/16/17 0013  BP: 126/75 128/80 (!) 173/97 130/76  Pulse: 99 99 (!) 109 88  Resp: 18 19    Temp:    98.2 F (36.8  C)  TempSrc:    Oral  SpO2: 100% 100% 100% 100%  Weight:      Height:        Constitutional: NAD, calm, comfortable Eyes: PERRL, lids and conjunctivae normal ENMT: Mucous membranes are moist. Posterior pharynx clear of any exudate or lesions.Normal dentition.  Neck: normal, supple, no masses. Respiratory: clear to auscultation bilaterally, no wheezing, no crackles. Normal respiratory effort. No accessory muscle use.  Cardiovascular: Regular rate and rhythm, no murmurs / rubs / gallops. No extremity edema. Abdomen: colostomy in place, no tenderness, no masses palpated. No hepatosplenomegaly. Bowel sounds positive.  Musculoskeletal: Left breast and axillary swelling with overlying erythema and warmth to touch, tender to palpation, no discharge or open wound  Skin: Left breast swelling with overlying erythema and warmth to touch, tender to palpation, no discharge or open wound  Neurologic: CN 2-12 grossly intact. Sensation intactStrength 5/5 in all 4.  Psychiatric: Normal judgment and insight. Alert and oriented x 3. Normal mood.     Labs on Admission: I have personally reviewed following labs  and imaging studies  CBC: Recent Labs  Lab 12/15/17 1718 12/15/17 1800  WBC 15.6*  --   NEUTROABS 10.8*  --   HGB 11.5* 12.9  HCT 38.2 38.0  MCV 90.7  --   PLT 307  --    Basic Metabolic Panel: Recent Labs  Lab 12/15/17 1800  NA 138  K 3.8  CL 106  GLUCOSE 143*  BUN 28*  CREATININE 1.30*   GFR: Estimated Creatinine Clearance: 39.6 mL/min (A) (by C-G formula based on SCr of 1.3 mg/dL (H)). Liver Function Tests: No results for input(s): AST, ALT, ALKPHOS, BILITOT, PROT, ALBUMIN in the last 168 hours. No results for input(s): LIPASE, AMYLASE in the last 168 hours. No results for input(s): AMMONIA in the last 168 hours. Coagulation Profile: Recent Labs  Lab 12/15/17 1718  INR 3.48   Cardiac Enzymes: No results for input(s): CKTOTAL, CKMB, CKMBINDEX, TROPONINI in the last 168 hours. BNP (last 3 results) No results for input(s): PROBNP in the last 8760 hours. HbA1C: No results for input(s): HGBA1C in the last 72 hours. CBG: Recent Labs  Lab 12/15/17 2247  GLUCAP 115*   Lipid Profile: No results for input(s): CHOL, HDL, LDLCALC, TRIG, CHOLHDL, LDLDIRECT in the last 72 hours. Thyroid Function Tests: No results for input(s): TSH, T4TOTAL, FREET4, T3FREE, THYROIDAB in the last 72 hours. Anemia Panel: No results for input(s): VITAMINB12, FOLATE, FERRITIN, TIBC, IRON, RETICCTPCT in the last 72 hours. Urine analysis:    Component Value Date/Time   COLORURINE YELLOW 12/07/2017 1450   APPEARANCEUR CLOUDY (A) 12/07/2017 1450   LABSPEC 1.015 12/07/2017 1450   PHURINE 5.0 12/07/2017 1450   GLUCOSEU NEGATIVE 12/07/2017 1450   HGBUR SMALL (A) 12/07/2017 1450   BILIRUBINUR NEGATIVE 12/07/2017 1450   KETONESUR 5 (A) 12/07/2017 1450   PROTEINUR NEGATIVE 12/07/2017 1450   UROBILINOGEN 0.2 03/14/2017 1102   NITRITE NEGATIVE 12/07/2017 1450   LEUKOCYTESUR LARGE (A) 12/07/2017 1450    Radiological Exams on Admission: Dg Chest 2 View  Result Date: 12/15/2017 CLINICAL  DATA:  Bruising under the left breast EXAM: CHEST - 2 VIEW COMPARISON:  12/07/2017 FINDINGS: The heart size and mediastinal contours are within normal limits. Both lungs are clear. Degenerative osteophytes of the spine. IMPRESSION: No active cardiopulmonary disease. Electronically Signed   By: Jasmine Pang M.D.   On: 12/15/2017 18:20   Ct Chest W Contrast  Result Date: 12/15/2017 CLINICAL DATA:  Axillary cellulitis, pain starting last night EXAM: CT CHEST WITH CONTRAST TECHNIQUE: Multidetector CT imaging of the chest was performed during intravenous contrast administration. CONTRAST:  75mL OMNIPAQUE IOHEXOL 300 MG/ML  SOLN COMPARISON:  CT scan 08/05/2012 chest radiograph ball/13/19 FINDINGS: Cardiovascular: Coronary, aortic arch, and branch vessel atherosclerotic vascular disease. Mild calcification of the posterior leaflet of the mitral valve. Mediastinum/Nodes: Unremarkable Lungs/Pleura: Bandlike density in the right lower lobe is thought to be due to atelectasis rather than a true pulmonary nodule. Upper Abdomen: Hypodense lesion in the right kidney upper pole is technically nonspecific although statistically likely to be a benign cyst. Mild nodularity left adrenal gland is similar to the 10/24/2017 exam but otherwise nonspecific. Musculoskeletal: In the left breast we partially image a proximally 13.2 by 4.9 by 6.0 cm (volume = 200 cm^3) complex fluid collection with dependent higher density components and some suspected internal septation. The upper margin of this process extends slightly above the top image of today's CT scan. This is vertically oriented and appears to partially extend between the pectoralis minor and major muscle laterally on the left, and also involve the axilla. There is considerable surrounding edema in the left breast and axilla and tracking down the left chest wall. Thoracic spondylosis noted with multilevel bridging spurring. I do not see a discrete rib fracture. IMPRESSION: 1. At  least 200 cubic cm complex fluid collection in the left lateral breast oriented vertically. This appears to have some sort of internal fluid-fluid level possibly a hematoma, conceivably a breast abscess. There is notable surrounding subcutaneous edema in the breast and axilla. Please note that the upper margin of this process is not completely included on today's exam. While I am skeptical that this lesion is directly related to the axillary artery or vein, the segments of these vessels which would be close to the mass are not included on imaging of today's chest CT as they are above the axis of the top most image. If clinically warranted, sonography could be utilized to further assess the upper margin of the breast lesion particularly in relation to the axillary artery and vein. However, if the patient's diagnosis is clinically obvious, for example an obvious mastitis with fluctuant abscess, then sonographic workup may not be indicated. 2. Aortic Atherosclerosis (ICD10-I70.0). Coronary atherosclerosis. Mild mitral valve calcification. Electronically Signed   By: Gaylyn Rong M.D.   On: 12/15/2017 20:26    EKG: Independently reviewed.  Sinus tachycardia, LVH  Assessment/Plan Principal Problem:   Cellulitis of left breast Active Problems:   Hypothyroid   Essential hypertension   Diabetes mellitus with neurological manifestations, controlled (HCC)   History of pulmonary embolism   AKI (acute kidney injury) (HCC)   Large bowel stricture (HCC)   Hannah [name] is a 77 y.o. female with medical history significant for Hx of PE on Coumadin, T2DM, HTN, HLD, hypothyroidism, and large bowel stricture s/p colectomy with colostomy who presents to the ED with left breast swelling with appearance of overlying cellulitis..  CT imaging showing large fluid collection concerning for hematoma versus abscess.    Left breast fluid collection with overlying nonpurulent cellulitis: Question of hematoma in setting of  supratherapeutic INR versus abscess formation.  She was started on IV vancomycin, will de-escalate to ceftriaxone and see if we can obtain ultrasound for further assessment. -Obtain ultrasound -DC vancomycin, start ceftriaxone -will likely need IR drainage versus surgical I&D  Supratherapeutic INR in setting of Coumadin use for history of PE: INR 3.48 on admission.  She  was given IV vitamin K 5 mg once in the ED. -Hold Coumadin -Recheck INR in a.m.  Acute kidney injury: Creatinine 1.3 admission. -s/p 500 mL NS -Hold lisinopril -Repeat bmet  Type 2 diabetes: On metformin as an outpatient. -SSI  Hypertension: -Continue home Lopressor, hold lisinopril as above  Hypothyroidism: -Continue home Synthroid  History large bowel stricture status post colectomy with colostomy: Chronic and stable issue. -Ostomy care   DVT prophylaxis: SCDs  Code Status: Full code Family Communication: 2 daughters at bedside Disposition Plan: Pending assessment for possible drainage or I&D of left breast fluid collection Consults called: None Admission status: Observation   Darreld Mclean MD [facility] Pager [phone]  If 7PM-7AM, please contact night-coverage [email or web address] Password [email or web address]  12/16/2017, 1:49 AM

## 2017-12-16 DIAGNOSIS — E785 Hyperlipidemia, unspecified: Secondary | ICD-10-CM | POA: Diagnosis present

## 2017-12-16 DIAGNOSIS — Z7984 Long term (current) use of oral hypoglycemic drugs: Secondary | ICD-10-CM | POA: Diagnosis not present

## 2017-12-16 DIAGNOSIS — N61 Mastitis without abscess: Secondary | ICD-10-CM | POA: Diagnosis not present

## 2017-12-16 DIAGNOSIS — Z880 Allergy status to penicillin: Secondary | ICD-10-CM | POA: Diagnosis not present

## 2017-12-16 DIAGNOSIS — D62 Acute posthemorrhagic anemia: Secondary | ICD-10-CM | POA: Diagnosis present

## 2017-12-16 DIAGNOSIS — Z7901 Long term (current) use of anticoagulants: Secondary | ICD-10-CM | POA: Diagnosis not present

## 2017-12-16 DIAGNOSIS — Z79899 Other long term (current) drug therapy: Secondary | ICD-10-CM | POA: Diagnosis not present

## 2017-12-16 DIAGNOSIS — Z96642 Presence of left artificial hip joint: Secondary | ICD-10-CM | POA: Diagnosis present

## 2017-12-16 DIAGNOSIS — E039 Hypothyroidism, unspecified: Secondary | ICD-10-CM | POA: Diagnosis present

## 2017-12-16 DIAGNOSIS — N179 Acute kidney failure, unspecified: Secondary | ICD-10-CM | POA: Diagnosis not present

## 2017-12-16 DIAGNOSIS — N6489 Other specified disorders of breast: Secondary | ICD-10-CM | POA: Diagnosis present

## 2017-12-16 DIAGNOSIS — L0390 Cellulitis, unspecified: Secondary | ICD-10-CM | POA: Diagnosis not present

## 2017-12-16 DIAGNOSIS — Z881 Allergy status to other antibiotic agents status: Secondary | ICD-10-CM | POA: Diagnosis not present

## 2017-12-16 DIAGNOSIS — I10 Essential (primary) hypertension: Secondary | ICD-10-CM | POA: Diagnosis not present

## 2017-12-16 DIAGNOSIS — Z888 Allergy status to other drugs, medicaments and biological substances status: Secondary | ICD-10-CM | POA: Diagnosis not present

## 2017-12-16 DIAGNOSIS — M7981 Nontraumatic hematoma of soft tissue: Secondary | ICD-10-CM | POA: Diagnosis not present

## 2017-12-16 DIAGNOSIS — N611 Abscess of the breast and nipple: Secondary | ICD-10-CM | POA: Diagnosis present

## 2017-12-16 DIAGNOSIS — Z86711 Personal history of pulmonary embolism: Secondary | ICD-10-CM | POA: Diagnosis not present

## 2017-12-16 DIAGNOSIS — E1140 Type 2 diabetes mellitus with diabetic neuropathy, unspecified: Secondary | ICD-10-CM | POA: Diagnosis not present

## 2017-12-16 DIAGNOSIS — Z8674 Personal history of sudden cardiac arrest: Secondary | ICD-10-CM | POA: Diagnosis not present

## 2017-12-16 DIAGNOSIS — Z9049 Acquired absence of other specified parts of digestive tract: Secondary | ICD-10-CM | POA: Diagnosis not present

## 2017-12-16 DIAGNOSIS — Z933 Colostomy status: Secondary | ICD-10-CM | POA: Diagnosis not present

## 2017-12-16 DIAGNOSIS — R791 Abnormal coagulation profile: Secondary | ICD-10-CM | POA: Diagnosis present

## 2017-12-16 LAB — GLUCOSE, CAPILLARY
GLUCOSE-CAPILLARY: 92 mg/dL (ref 70–99)
Glucose-Capillary: 71 mg/dL (ref 70–99)
Glucose-Capillary: 132 mg/dL — ABNORMAL HIGH (ref 70–99)
Glucose-Capillary: 130 mg/dL — ABNORMAL HIGH (ref 70–99)

## 2017-12-16 LAB — BASIC METABOLIC PANEL
Anion gap: 10 (ref 5–15)
BUN: 20 mg/dL (ref 8–23)
CO2: 21 mmol/L — ABNORMAL LOW (ref 22–32)
Calcium: 8.4 mg/dL — ABNORMAL LOW (ref 8.9–10.3)
Chloride: 109 mmol/L (ref 98–111)
Creatinine, Ser: 1.13 mg/dL — ABNORMAL HIGH (ref 0.44–1.00)
GFR calc non Af Amer: 47 mL/min — ABNORMAL LOW (ref >60)
GFR, EST AFRICAN AMERICAN: 55 mL/min — AB (ref >60)
Glucose, Bld: 109 mg/dL — ABNORMAL HIGH (ref 70–99)
Potassium: 4.1 mmol/L (ref 3.5–5.1)
Sodium: 140 mmol/L (ref 135–145)

## 2017-12-16 LAB — PROTIME-INR
INR: 2.35
Prothrombin Time: 25.4 seconds — ABNORMAL HIGH (ref 11.4–15.2)

## 2017-12-16 LAB — SURGICAL PCR SCREEN
MRSA, PCR: NEGATIVE
STAPHYLOCOCCUS AUREUS: NEGATIVE

## 2017-12-16 MED ORDER — SODIUM CHLORIDE 0.9 % IV SOLN
1.0000 g | INTRAVENOUS | Status: DC
  Administered 2017-12-16 – 2017-12-18 (×3): 1 g via INTRAVENOUS
  Filled 2017-12-16 (×3): qty 10

## 2017-12-16 NOTE — Consult Note (Signed)
Reason for Consult: Left breast hematoma Referring Physician: Mikeal Hawthorne MD  Hannah [name] is an 77 y.o. female.  HPI: Asked to see patient at the request of Dr. August Luz due to left breast swelling and pain.  She is on Coumadin for chronic pulmonary embolus.  She fell about a week ago at home landing on her buttocks.  She was admitted earlier this week due to left breast pain and swelling.  CT scan showed a possible hematoma versus abscess.  She complains of left breast swelling and pain.  Her white count is normal and she has no fever nor chills.  CT scan was reviewed which shows a layering hematoma left lateral breast.  She is having her Coumadin held for now.  Her hemoglobin is stable and she is in no distress.  Past Medical History:  Diagnosis Date  . Acute massive pulmonary embolism (HCC) 07/13/2012   Massive PE w/ PEA arrest 07/13/12 >TNK >IVC filter >discharged on comadin    . Diabetes mellitus, type 2 (HCC) 07/15/2012   with peripheral neuropathy  . Hyperlipemia 07/14/2012  . Hypertension 07/14/2012  . Large bowel stricture (HCC)    s/p colectomy in 2016  . Osteoarthritis    s/p hip and knee replacements  . Thyroid disease     Past Surgical History:  Procedure Laterality Date  . COLONOSCOPY N/A 03/12/2016   Procedure: COLONOSCOPY;  Surgeon: Dorena Cookey, MD;  Location: [facility] ENDOSCOPY;  Service: Endoscopy;  Laterality: N/A;  . COLOSTOMY N/A 06/03/2014   Procedure: COLOSTOMY;  Surgeon: Harriette Bouillon, MD;  Location: [facility] OR;  Service: General;  Laterality: N/A;  . FLEXIBLE SIGMOIDOSCOPY N/A 05/30/2014   Procedure: Arnell Sieving;  Surgeon: Jeani Hawking, MD;  Location: [facility] ENDOSCOPY;  Service: Endoscopy;  Laterality: N/A;  . INSERTION OF VENA CAVA FILTER N/A 07/16/2012   Procedure: INSERTION OF VENA CAVA FILTER;  Surgeon: [name], MD;  Location: [facility] CATH LAB;  Service: Cardiovascular;  Laterality: N/A;  . KNEE ARTHROSCOPY    . PARTIAL COLECTOMY N/A 06/03/2014   Procedure: PARTIAL  COLECTOMY;  Surgeon: Harriette Bouillon, MD;  Location: [facility] OR;  Service: General;  Laterality: N/A;  . REDUCTION MAMMAPLASTY Bilateral   . SP ARTHRO HIP*L*      Family History  Problem Relation Age of Onset  . Breast cancer Mother     Social History:  reports that she quit smoking about 49 years ago. Her smoking use included cigarettes. She has a 10.00 pack-year smoking history. She has never used smokeless tobacco. She reports that she does not drink alcohol or use drugs.  Allergies:  Allergies  Allergen Reactions  . Augmentin [Amoxicillin-Pot Clavulanate] Itching and Other (See Comments)    Severe vaginal itching  . Tranxene [Clorazepate] Itching  . Penicillins Itching and Rash    Has patient had a PCN reaction causing immediate rash, facial/tongue/throat swelling, SOB or lightheadedness with hypotension: Yes Has patient had a PCN reaction causing severe rash involving mucus membranes or skin necrosis: No Has patient had a PCN reaction that required hospitalization: No Has patient had a PCN reaction occurring within the last 10 years: No If all of the above answers are "NO", then may proceed with Cephalosporin use.     Medications: I have reviewed the patient's current medications.  Results for orders placed or performed during the hospital encounter of 12/15/17 (from the past 48 hour(s))  CBC with Differential     Status: Abnormal   Collection Time: 12/15/17  5:18 PM  Result  Value Ref Range   WBC 15.6 (H) 4.0 - 10.5 K/uL   RBC 4.21 3.87 - 5.11 MIL/uL   Hemoglobin 11.5 (L) 12.0 - 15.0 g/dL   HCT 62.5 63.8 - 93.7 %   MCV 90.7 80.0 - 100.0 fL   MCH 27.3 26.0 - 34.0 pg   MCHC 30.1 30.0 - 36.0 g/dL   RDW 34.2 87.6 - 81.1 %   Platelets 307 150 - 400 K/uL   nRBC 0.0 0.0 - 0.2 %   Neutrophils Relative % 70 %   Neutro Abs 10.8 (H) 1.7 - 7.7 K/uL   Lymphocytes Relative 23 %   Lymphs Abs 3.6 0.7 - 4.0 K/uL   Monocytes Relative 6 %   Monocytes Absolute 1.0 0.1 - 1.0 K/uL    Eosinophils Relative 1 %   Eosinophils Absolute 0.1 0.0 - 0.5 K/uL   Basophils Relative 0 %   Basophils Absolute 0.1 0.0 - 0.1 K/uL   Immature Granulocytes 0 %   Abs Immature Granulocytes 0.06 0.00 - 0.07 K/uL    Comment: Performed at [facility], [address] [place], [place] [address]  Protime-INR     Status: Abnormal   Collection Time: 12/15/17  5:18 PM  Result Value Ref Range   Prothrombin Time 34.5 (H) 11.4 - 15.2 seconds   INR 3.48     Comment: Performed at [facility], [address], [place], [place] [address]  I-stat troponin, ED     Status: None   Collection Time: 12/15/17  5:58 PM  Result Value Ref Range   Troponin i, poc 0.02 0.00 - 0.08 ng/mL   Comment 3            Comment: Due to the release kinetics of cTnI, a negative result within the first hours of the onset of symptoms does not rule out myocardial infarction with certainty. If myocardial infarction is still suspected, repeat the test at appropriate intervals.   I-Stat CG4 Lactic Acid, ED     Status: Abnormal   Collection Time: 12/15/17  6:00 PM  Result Value Ref Range   Lactic Acid, Venous 2.27 (HH) 0.5 - 1.9 mmol/L   Comment NOTIFIED PHYSICIAN   I-stat Chem 8, ED     Status: Abnormal   Collection Time: 12/15/17  6:00 PM  Result Value Ref Range   Sodium 138 135 - 145 mmol/L   Potassium 3.8 3.5 - 5.1 mmol/L   Chloride 106 98 - 111 mmol/L   BUN 28 (H) 8 - 23 mg/dL   Creatinine, Ser 7.41 (H) 0.44 - 1.00 mg/dL   Glucose, Bld 638 (H) 70 - 99 mg/dL   Calcium, Ion 4.53 6.46 - 1.40 mmol/L   TCO2 25 22 - 32 mmol/L   Hemoglobin 12.9 12.0 - 15.0 g/dL   HCT 80.3 21.2 - 24.8 %  I-Stat CG4 Lactic Acid, ED     Status: None   Collection Time: 12/15/17  9:22 PM  Result Value Ref Range   Lactic Acid, Venous 0.86 0.5 - 1.9 mmol/L  Glucose, capillary     Status: Abnormal   Collection Time: 12/15/17 10:47 PM  Result Value Ref Range   Glucose-Capillary 115 (H) 70 - 99 mg/dL   Comment 1 Document in  Chart   Surgical PCR screen     Status: None   Collection Time: 12/16/17 12:58 AM  Result Value Ref Range   MRSA, PCR NEGATIVE NEGATIVE   Staphylococcus aureus NEGATIVE NEGATIVE  Comment: (NOTE) The Xpert SA Assay (FDA approved for NASAL specimens in patients 17 years of age and older), is one component of a comprehensive surveillance program. It is not intended to diagnose infection nor to guide or monitor treatment. Performed at [facility], [address], [place], [place] [address]   Protime-INR     Status: Abnormal   Collection Time: 12/16/17  3:03 AM  Result Value Ref Range   Prothrombin Time 25.4 (H) 11.4 - 15.2 seconds   INR 2.35     Comment: Performed at [facility], [address] [place], [place] [address]  CBC     Status: Abnormal   Collection Time: 12/16/17  3:03 AM  Result Value Ref Range   WBC 11.5 (H) 4.0 - 10.5 K/uL   RBC 3.31 (L) 3.87 - 5.11 MIL/uL   Hemoglobin 8.9 (L) 12.0 - 15.0 g/dL    Comment: REPEATED TO VERIFY   HCT 29.6 (L) 36.0 - 46.0 %   MCV 89.4 80.0 - 100.0 fL   MCH 26.9 26.0 - 34.0 pg   MCHC 30.1 30.0 - 36.0 g/dL   RDW 82.9 56.2 - 13.0 %   Platelets 272 150 - 400 K/uL   nRBC 0.0 0.0 - 0.2 %    Comment: Performed at [facility], [address], [place], [place] [address]  Basic metabolic panel     Status: Abnormal   Collection Time: 12/16/17  3:03 AM  Result Value Ref Range   Sodium 140 135 - 145 mmol/L   Potassium 4.1 3.5 - 5.1 mmol/L   Chloride 109 98 - 111 mmol/L   CO2 21 (L) 22 - 32 mmol/L   Glucose, Bld 109 (H) 70 - 99 mg/dL   BUN 20 8 - 23 mg/dL   Creatinine, Ser 4.69 (H) 0.44 - 1.00 mg/dL   Calcium 8.4 (L) 8.9 - 10.3 mg/dL   GFR calc non Af Amer 47 (L) >60 mL/min   GFR calc Af Amer 55 (L) >60 mL/min   Anion gap 10 5 - 15    Comment: Performed at [facility], [address], [place], [place] [address]  Glucose, capillary     Status: None   Collection Time: 12/16/17  6:57 AM  Result Value Ref Range    Glucose-Capillary 92 70 - 99 mg/dL    Dg Chest 2 View  Result Date: 12/15/2017 CLINICAL DATA:  Bruising under the left breast EXAM: CHEST - 2 VIEW COMPARISON:  12/07/2017 FINDINGS: The heart size and mediastinal contours are within normal limits. Both lungs are clear. Degenerative osteophytes of the spine. IMPRESSION: No active cardiopulmonary disease. Electronically Signed   By: Jasmine Pang M.D.   On: 12/15/2017 18:20   Ct Chest W Contrast  Result Date: 12/15/2017 CLINICAL DATA:  Axillary cellulitis, pain starting last night EXAM: CT CHEST WITH CONTRAST TECHNIQUE: Multidetector CT imaging of the chest was performed during intravenous contrast administration. CONTRAST:  75mL OMNIPAQUE IOHEXOL 300 MG/ML  SOLN COMPARISON:  CT scan 08/05/2012 chest radiograph ball/13/19 FINDINGS: Cardiovascular: Coronary, aortic arch, and branch vessel atherosclerotic vascular disease. Mild calcification of the posterior leaflet of the mitral valve. Mediastinum/Nodes: Unremarkable Lungs/Pleura: Bandlike density in the right lower lobe is thought to be due to atelectasis rather than a true pulmonary nodule. Upper Abdomen: Hypodense lesion in the right kidney upper pole is technically nonspecific although statistically likely to be a benign cyst. Mild nodularity left adrenal gland is similar to the  10/24/2017 exam but otherwise nonspecific. Musculoskeletal: In the left breast we partially image a proximally 13.2 by 4.9 by 6.0 cm (volume = 200 cm^3) complex fluid collection with dependent higher density components and some suspected internal septation. The upper margin of this process extends slightly above the top image of today's CT scan. This is vertically oriented and appears to partially extend between the pectoralis minor and major muscle laterally on the left, and also involve the axilla. There is considerable surrounding edema in the left breast and axilla and tracking down the left chest wall. Thoracic spondylosis  noted with multilevel bridging spurring. I do not see a discrete rib fracture. IMPRESSION: 1. At least 200 cubic cm complex fluid collection in the left lateral breast oriented vertically. This appears to have some sort of internal fluid-fluid level possibly a hematoma, conceivably a breast abscess. There is notable surrounding subcutaneous edema in the breast and axilla. Please note that the upper margin of this process is not completely included on today's exam. While I am skeptical that this lesion is directly related to the axillary artery or vein, the segments of these vessels which would be close to the mass are not included on imaging of today's chest CT as they are above the axis of the top most image. If clinically warranted, sonography could be utilized to further assess the upper margin of the breast lesion particularly in relation to the axillary artery and vein. However, if the patient's diagnosis is clinically obvious, for example an obvious mastitis with fluctuant abscess, then sonographic workup may not be indicated. 2. Aortic Atherosclerosis (ICD10-I70.0). Coronary atherosclerosis. Mild mitral valve calcification. Electronically Signed   By: Gaylyn Rong M.D.   On: 12/15/2017 20:26    Review of Systems  All other systems reviewed and are negative.  Blood pressure 137/76, pulse 82, temperature 98.5 F (36.9 C), temperature source Oral, resp. rate 18, height 5\' 7"  (1.702 m), weight 78 kg, SpO2 100 %. Physical Exam  Constitutional: She is oriented to person, place, and time. She appears well-developed and well-nourished.  HENT:  Head: Normocephalic and atraumatic.  Eyes: Pupils are equal, round, and reactive to light. EOM are normal.  Neck: Normal range of motion.  Cardiovascular: Normal rate.  Respiratory: Effort normal.    GI: Soft.  Left lower quadrant colostomy noted  Neurological: She is alert and oriented to person, place, and time.  Skin: Skin is warm and dry.    Psychiatric: She has a normal mood and affect. Her behavior is normal.    Assessment/Plan: History of fall with left breast hematoma secondary to anticoagulation  Less likely cellulitis at this point but she could develop cellulitis over time.  No acute surgical need for now.  Hold anticoagulation.  We will follow along the next day or 2 to ensure this improves.  If not she may require drainage later on this week.  Daylee Delahoz A Carron Jaggi 12/16/2017, 10:05 AM

## 2017-12-16 NOTE — Plan of Care (Signed)
  Problem: Education: Goal: Knowledge of General Education information will improve Description: Including pain rating scale, medication(s)/side effects and non-pharmacologic comfort measures Outcome: Progressing   Problem: Clinical Measurements: Goal: Ability to maintain clinical measurements within normal limits will improve Outcome: Progressing   

## 2017-12-16 NOTE — Plan of Care (Signed)

## 2017-12-16 NOTE — Progress Notes (Signed)
Patient ID: Hannah [name], female   DOB: [birth year], 77 y.o.   MRN: [id number]   PROGRESS NOTE    Hannah [name]  WUJ:[id number] DOB: 12/24/1940 DOA: 12/15/2017 PCP: [name], DO   Brief Narrative:  Hannah [name] is a 77 y.o. female with medical history significant for Hx of PE on Coumadin, T2DM, HTN, HLD, hypothyroidism, and large bowel stricture s/p colectomy with colostomy who presents to the ED with left breast swelling.  Patient was recently admitted from 12/07/17-12/08/2017 after a fall at home.  She was diagnosed and treated for UTI and discharged to home with home health physical therapy.  Last night she noticed swelling at her left breast with dark discoloration at the skin.  She is unsure if this was present in between her recent hospitalization and yesterday.  Today she noticed that the overlying skin turned red.  She has had associated pain without discharge or open wound.  She has been on Coumadin for history of PE.  Patient's INR was supratherapeutic at 3.42 on admission currently down to 2.3 after vitamin K.  She is admitted with left breast abscess and possible hematoma   Assessment & Plan:   Principal Problem:   Cellulitis of left breast Active Problems:   Hypothyroid   Essential hypertension   Diabetes mellitus with neurological manifestations, controlled (HCC)   History of pulmonary embolism   AKI (acute kidney injury) (HCC)   Large bowel stricture (HCC)   #1 left breast cellulitis versus hematoma: Patient is still having pain.  Breast tissues appears red and swollen.  Surgery consulted.  CT showed fluid collection in the breast.  We will see if this is reflective of infection or abscess.  Hematoma most likely the cause since patient has supratherapeutic INR.  Continue both antibiotics and elevation with warm compress.  #2 hypothyroidism: Continue with home regimen of levothyroxine.  #3 diabetes: Patient is a diabetic which raises possibility of infections.  Continue  sliding scale insulin with home regimen.  #4 hypertension: Blood pressure appears controlled.  Continue current regimen.  #5 history of pulmonary embolism: INR is supratherapeutic.  Once INR is less than 2 will resume Coumadin.  Check PT/INR daily  #6 acute kidney injury: Stable at this point.  Continue monitoring.  #7 history of lower bowel stricture: Patient had colostomy done With ongoing care.  Continue.   DVT prophylaxis: SCD Code Status: Full code Family Communication: No family currently available Disposition Plan:   Most likely home  Consultants:  General surgery, Dr. Maisie Fus Cornett  Procedures:   None   Antimicrobials:   Day #1 of Rocephin   Subjective: Patient is doing much better with no new complaint.  She has pain in the breast mainly at 6 out of 10 this morning.  Denies any fever or chills no nausea vomiting or diarrhea.  Objective: Vitals:   12/15/17 2230 12/16/17 0013 12/16/17 0453 12/16/17 0754  BP: (!) 173/97 130/76 (!) 142/75 137/76  Pulse: (!) 109 88 77 82  Resp:    18  Temp:  98.2 F (36.8 C) 97.9 F (36.6 C) 98.5 F (36.9 C)  TempSrc:  Oral Oral Oral  SpO2: 100% 100% 100% 100%  Weight:      Height:        Intake/Output Summary (Last 24 hours) at 12/16/2017 0910 Last data filed at 12/16/2017 0400 Gross per 24 hour  Intake 936.01 ml  Output -  Net 936.01 ml   Filed Weights   12/15/17 1700  Weight:  78 kg    Examination:  General exam: Appears calm and comfortable, in pain. Respiratory system: Clear to auscultation. Respiratory effort normal. Cardiovascular system: S1 & S2 heard, RRR. No JVD, murmurs, rubs, gallops or clicks. No pedal edema. Gastrointestinal system: Abdomen is nondistended, soft and nontender. No organomegaly or masses felt. Normal bowel sounds heard. Central nervous system: Alert and oriented. No focal neurological deficits. Extremities: Symmetric 5 x 5 power. Skin: Left breast tissue swollen, right, indurated and  tender Psychiatry: Judgement and insight appear normal. Mood & affect appropriate.     Data Reviewed: I have personally reviewed following labs and imaging studies  CBC: Recent Labs  Lab 12/15/17 1718 12/15/17 1800 12/16/17 0303  WBC 15.6*  --  11.5*  NEUTROABS 10.8*  --   --   HGB 11.5* 12.9 8.9*  HCT 38.2 38.0 29.6*  MCV 90.7  --  89.4  PLT 307  --  272   Basic Metabolic Panel: Recent Labs  Lab 12/15/17 1800 12/16/17 0303  NA 138 140  K 3.8 4.1  CL 106 109  CO2  --  21*  GLUCOSE 143* 109*  BUN 28* 20  CREATININE 1.30* 1.13*  CALCIUM  --  8.4*   GFR: Estimated Creatinine Clearance: 45.6 mL/min (A) (by C-G formula based on SCr of 1.13 mg/dL (H)). Liver Function Tests: No results for input(s): AST, ALT, ALKPHOS, BILITOT, PROT, ALBUMIN in the last 168 hours. No results for input(s): LIPASE, AMYLASE in the last 168 hours. No results for input(s): AMMONIA in the last 168 hours. Coagulation Profile: Recent Labs  Lab 12/15/17 1718 12/16/17 0303  INR 3.48 2.35   Cardiac Enzymes: No results for input(s): CKTOTAL, CKMB, CKMBINDEX, TROPONINI in the last 168 hours. BNP (last 3 results) No results for input(s): PROBNP in the last 8760 hours. HbA1C: No results for input(s): HGBA1C in the last 72 hours. CBG: Recent Labs  Lab 12/15/17 2247 12/16/17 0657  GLUCAP 115* 92   Lipid Profile: No results for input(s): CHOL, HDL, LDLCALC, TRIG, CHOLHDL, LDLDIRECT in the last 72 hours. Thyroid Function Tests: No results for input(s): TSH, T4TOTAL, FREET4, T3FREE, THYROIDAB in the last 72 hours. Anemia Panel: No results for input(s): VITAMINB12, FOLATE, FERRITIN, TIBC, IRON, RETICCTPCT in the last 72 hours. Sepsis Labs: Recent Labs  Lab 12/15/17 1800 12/15/17 2122  LATICACIDVEN 2.27* 0.86    Recent Results (from the past 240 hour(s))  Urine culture     Status: Abnormal   Collection Time: 12/07/17  2:52 PM  Result Value Ref Range Status   Specimen Description URINE,  RANDOM  Final   Special Requests   Final    NONE Performed at [facility], [address], [place], [place] [address]    Culture (A)  Final    >=100,000 COLONIES/mL MULTIPLE SPECIES PRESENT, SUGGEST RECOLLECTION   Report Status 12/08/2017 FINAL  Final  Surgical PCR screen     Status: None   Collection Time: 12/16/17 12:58 AM  Result Value Ref Range Status   MRSA, PCR NEGATIVE NEGATIVE Final   Staphylococcus aureus NEGATIVE NEGATIVE Final    Comment: (NOTE) The Xpert SA Assay (FDA approved for NASAL specimens in patients 19 years of age and older), is one component of a comprehensive surveillance program. It is not intended to diagnose infection nor to guide or monitor treatment. Performed at [facility], [address] [place], [place] [address]          Radiology Studies: Dg Chest  2 View  Result Date: 12/15/2017 CLINICAL DATA:  Bruising under the left breast EXAM: CHEST - 2 VIEW COMPARISON:  12/07/2017 FINDINGS: The heart size and mediastinal contours are within normal limits. Both lungs are clear. Degenerative osteophytes of the spine. IMPRESSION: No active cardiopulmonary disease. Electronically Signed   By: Jasmine Pang M.D.   On: 12/15/2017 18:20   Ct Chest W Contrast  Result Date: 12/15/2017 CLINICAL DATA:  Axillary cellulitis, pain starting last night EXAM: CT CHEST WITH CONTRAST TECHNIQUE: Multidetector CT imaging of the chest was performed during intravenous contrast administration. CONTRAST:  75mL OMNIPAQUE IOHEXOL 300 MG/ML  SOLN COMPARISON:  CT scan 08/05/2012 chest radiograph ball/13/19 FINDINGS: Cardiovascular: Coronary, aortic arch, and branch vessel atherosclerotic vascular disease. Mild calcification of the posterior leaflet of the mitral valve. Mediastinum/Nodes: Unremarkable Lungs/Pleura: Bandlike density in the right lower lobe is thought to be due to atelectasis rather than a true pulmonary nodule. Upper Abdomen: Hypodense lesion in the right  kidney upper pole is technically nonspecific although statistically likely to be a benign cyst. Mild nodularity left adrenal gland is similar to the 10/24/2017 exam but otherwise nonspecific. Musculoskeletal: In the left breast we partially image a proximally 13.2 by 4.9 by 6.0 cm (volume = 200 cm^3) complex fluid collection with dependent higher density components and some suspected internal septation. The upper margin of this process extends slightly above the top image of today's CT scan. This is vertically oriented and appears to partially extend between the pectoralis minor and major muscle laterally on the left, and also involve the axilla. There is considerable surrounding edema in the left breast and axilla and tracking down the left chest wall. Thoracic spondylosis noted with multilevel bridging spurring. I do not see a discrete rib fracture. IMPRESSION: 1. At least 200 cubic cm complex fluid collection in the left lateral breast oriented vertically. This appears to have some sort of internal fluid-fluid level possibly a hematoma, conceivably a breast abscess. There is notable surrounding subcutaneous edema in the breast and axilla. Please note that the upper margin of this process is not completely included on today's exam. While I am skeptical that this lesion is directly related to the axillary artery or vein, the segments of these vessels which would be close to the mass are not included on imaging of today's chest CT as they are above the axis of the top most image. If clinically warranted, sonography could be utilized to further assess the upper margin of the breast lesion particularly in relation to the axillary artery and vein. However, if the patient's diagnosis is clinically obvious, for example an obvious mastitis with fluctuant abscess, then sonographic workup may not be indicated. 2. Aortic Atherosclerosis (ICD10-I70.0). Coronary atherosclerosis. Mild mitral valve calcification. Electronically  Signed   By: Gaylyn Rong M.D.   On: 12/15/2017 20:26        Scheduled Meds: . insulin aspart  0-5 Units Subcutaneous QHS  . insulin aspart  0-9 Units Subcutaneous TID WC  . levothyroxine  25 mcg Oral QAC breakfast  . metoprolol tartrate  25 mg Oral BID  . polyethylene glycol  17 g Oral QODAY  . simvastatin  20 mg Oral QHS   Continuous Infusions: . cefTRIAXone (ROCEPHIN)  IV 1 g (12/16/17 0400)     LOS: 0 days    Time spent: 33 minutes    Dyron Kawano,LAWAL, MD [facility] Pager [phone]  If 7PM-7AM, please contact night-coverage [email or web address] Password [email or web address] 12/16/2017, 9:10 AM

## 2017-12-17 LAB — CBC
HCT: 29.6 % — ABNORMAL LOW (ref 36.0–46.0)
Hemoglobin: 8.9 g/dL — ABNORMAL LOW (ref 12.0–15.0)
MCH: 26.9 pg (ref 26.0–34.0)
MCHC: 30.1 g/dL (ref 30.0–36.0)
MCV: 89.4 fL (ref 80.0–100.0)
Platelets: 272 10*3/uL (ref 150–400)
RBC: 3.31 MIL/uL — ABNORMAL LOW (ref 3.87–5.11)
RDW: 15.4 % (ref 11.5–15.5)
WBC: 11.5 10*3/uL — ABNORMAL HIGH (ref 4.0–10.5)
nRBC: 0 % (ref 0.0–0.2)

## 2017-12-17 LAB — GLUCOSE, CAPILLARY
GLUCOSE-CAPILLARY: 111 mg/dL — AB (ref 70–99)
Glucose-Capillary: 116 mg/dL — ABNORMAL HIGH (ref 70–99)

## 2017-12-17 NOTE — Progress Notes (Signed)
Subjective/Chief Complaint: Left breast swelling Patient doing well except for soreness in her left breast.  No change in her pain   Objective: Vital signs in last 24 hours: Temp:  [97.9 F (36.6 C)-98.6 F (37 C)] 97.9 F (36.6 C) (12/15 0535) Pulse Rate:  [73-92] 88 (12/15 0535) Resp:  [18-20] 20 (12/14 1620) BP: (123-171)/(53-79) 133/79 (12/15 0535) SpO2:  [100 %] 100 % (12/15 0535) Weight:  [78 kg] 78 kg (12/15 0500) Last BM Date: 12/16/17  Intake/Output from previous day: 12/14 0701 - 12/15 0700 In: -  Out: 200 [Urine:200] Intake/Output this shift: No intake/output data recorded.  General appearance: alert and cooperative Head: Normocephalic, without obvious abnormality, atraumatic Breasts: Left breast hematoma upper outer quadrant stable.  Skin changes less today.  Lab Results:  Recent Labs    12/15/17 1718 12/15/17 1800 12/16/17 0303  WBC 15.6*  --  11.5*  HGB 11.5* 12.9 8.9*  HCT 38.2 38.0 29.6*  PLT 307  --  272   BMET Recent Labs    12/15/17 1800 12/16/17 0303  NA 138 140  K 3.8 4.1  CL 106 109  CO2  --  21*  GLUCOSE 143* 109*  BUN 28* 20  CREATININE 1.30* 1.13*  CALCIUM  --  8.4*   PT/INR Recent Labs    12/15/17 1718 12/16/17 0303  LABPROT 34.5* 25.4*  INR 3.48 2.35   ABG No results for input(s): PHART, HCO3 in the last 72 hours.  Invalid input(s): PCO2, PO2  Studies/Results: Dg Chest 2 View  Result Date: 12/15/2017 CLINICAL DATA:  Bruising under the left breast EXAM: CHEST - 2 VIEW COMPARISON:  12/07/2017 FINDINGS: The heart size and mediastinal contours are within normal limits. Both lungs are clear. Degenerative osteophytes of the spine. IMPRESSION: No active cardiopulmonary disease. Electronically Signed   By: Jasmine Pang M.D.   On: 12/15/2017 18:20   Ct Chest W Contrast  Result Date: 12/15/2017 CLINICAL DATA:  Axillary cellulitis, pain starting last night EXAM: CT CHEST WITH CONTRAST TECHNIQUE: Multidetector CT imaging  of the chest was performed during intravenous contrast administration. CONTRAST:  6mL OMNIPAQUE IOHEXOL 300 MG/ML  SOLN COMPARISON:  CT scan 08/05/2012 chest radiograph ball/13/19 FINDINGS: Cardiovascular: Coronary, aortic arch, and branch vessel atherosclerotic vascular disease. Mild calcification of the posterior leaflet of the mitral valve. Mediastinum/Nodes: Unremarkable Lungs/Pleura: Bandlike density in the right lower lobe is thought to be due to atelectasis rather than a true pulmonary nodule. Upper Abdomen: Hypodense lesion in the right kidney upper pole is technically nonspecific although statistically likely to be a benign cyst. Mild nodularity left adrenal gland is similar to the 10/24/2017 exam but otherwise nonspecific. Musculoskeletal: In the left breast we partially image a proximally 13.2 by 4.9 by 6.0 cm (volume = 200 cm^3) complex fluid collection with dependent higher density components and some suspected internal septation. The upper margin of this process extends slightly above the top image of today's CT scan. This is vertically oriented and appears to partially extend between the pectoralis minor and major muscle laterally on the left, and also involve the axilla. There is considerable surrounding edema in the left breast and axilla and tracking down the left chest wall. Thoracic spondylosis noted with multilevel bridging spurring. I do not see a discrete rib fracture. IMPRESSION: 1. At least 200 cubic cm complex fluid collection in the left lateral breast oriented vertically. This appears to have some sort of internal fluid-fluid level possibly a hematoma, conceivably a breast abscess. There is  notable surrounding subcutaneous edema in the breast and axilla. Please note that the upper margin of this process is not completely included on today's exam. While I am skeptical that this lesion is directly related to the axillary artery or vein, the segments of these vessels which would be close to  the mass are not included on imaging of today's chest CT as they are above the axis of the top most image. If clinically warranted, sonography could be utilized to further assess the upper margin of the breast lesion particularly in relation to the axillary artery and vein. However, if the patient's diagnosis is clinically obvious, for example an obvious mastitis with fluctuant abscess, then sonographic workup may not be indicated. 2. Aortic Atherosclerosis (ICD10-I70.0). Coronary atherosclerosis. Mild mitral valve calcification. Electronically Signed   By: Gaylyn Rong M.D.   On: 12/15/2017 20:26    Anti-infectives: Anti-infectives (From admission, onward)   Start     Dose/Rate Route Frequency Ordered Stop   12/16/17 0145  cefTRIAXone (ROCEPHIN) 1 g in sodium chloride 0.9 % 100 mL IVPB     1 g 200 mL/hr over 30 Minutes Intravenous Every 24 hours 12/16/17 0142     12/15/17 2030  vancomycin (VANCOCIN) 1,500 mg in sodium chloride 0.9 % 500 mL IVPB  Status:  Discontinued     1,500 mg 250 mL/hr over 120 Minutes Intravenous Every 24 hours 12/15/17 1950 12/16/17 0142   12/15/17 1830  vancomycin (VANCOCIN) 1,500 mg in sodium chloride 0.9 % 500 mL IVPB  Status:  Discontinued     1,500 mg 250 mL/hr over 120 Minutes Intravenous  Once 12/15/17 1827 12/15/17 1950      Assessment/Plan: Patient Active Problem List   Diagnosis Date Noted  . Cellulitis 12/16/2017  . Cellulitis of left breast 12/15/2017  . Large bowel stricture (HCC) 12/15/2017  . Fall at home, initial encounter 12/07/2017  . Acute lower UTI 12/07/2017  . UTI (urinary tract infection) 12/07/2017  . AKI (acute kidney injury) (HCC) 12/07/2017  . Leucocytosis 12/07/2017  . Chronic diastolic CHF (congestive heart failure) (HCC) 03/15/2017  . Acute GI bleeding 03/15/2017  . Anticoagulated on warfarin   . Pulmonary hypertension (HCC) 07/26/2016  . GI bleed 03/10/2016  . History of DVT in adulthood 03/10/2016  . Acute blood loss  anemia 03/10/2016  . Gastrointestinal hemorrhage   . Diabetes mellitus with complication (HCC)   . Encounter for therapeutic drug monitoring 05/04/2015  . Neuropathy 02/12/2015  . Osteoarthritis 08/12/2014  . Normocytic anemia 06/08/2014  . History of pulmonary embolism 06/10/2013  . Hypothyroid 06/06/2013  . Essential hypertension 06/06/2013  . Diabetes mellitus with neurological manifestations, controlled (HCC) 06/06/2013  . Long term current use of anticoagulant therapy 07/27/2012    Breast exam stable.  Can be discharged home with outpatient follow-up.  Needs to be seen by the breast center [facility] in 1 to 2 weeks for reassessment of hematoma.  No signs of infection.  Follow-up [facility] in 2 to 3 weeks or sooner if necessary.  Would hold hold anticoagulation for another 48 hours before restarting.   LOS: 1 day    Clovis Pu Alfretta Pinch 12/17/2017

## 2017-12-17 NOTE — Plan of Care (Signed)

## 2017-12-17 NOTE — Progress Notes (Signed)
Patient ID: Hannah [name], female   DOB: Nov 12, 1940, 77 y.o.   MRN: [id number]   PROGRESS NOTE    Hannah [name]  SRP:[id number] DOB: Feb 21, 1940 DOA: 12/15/2017 PCP: [name], DO   Brief Narrative:  Hannah [name] is a 77 y.o. female with medical history significant for Hx of PE on Coumadin, T2DM, HTN, HLD, hypothyroidism, and large bowel stricture s/p colectomy with colostomy who presents to the ED with left breast swelling.  Patient was recently admitted from 12/07/17-12/08/2017 after a fall at home.  She was diagnosed and treated for UTI and discharged to home with home health physical therapy.  Last night she noticed swelling at her left breast with dark discoloration at the skin.  She is unsure if this was present in between her recent hospitalization and yesterday.  Today she noticed that the overlying skin turned red.  She has had associated pain without discharge or open wound.  She has been on Coumadin for history of PE.  Patient's INR was supratherapeutic at 3.42 on admission down to 2.3 after vitamin K.  She is admitted with left breast abscess and possible hematoma   Assessment & Plan:   Principal Problem:   Cellulitis of left breast Active Problems:   Hypothyroid   Essential hypertension   Diabetes mellitus with neurological manifestations, controlled (HCC)   History of pulmonary embolism   AKI (acute kidney injury) (HCC)   Large bowel stricture (HCC)   Cellulitis   #1 left breast cellulitis versus hematoma: Patient is doing much better now.  Still has pain in the breast but does not appear to be close to what it was.  Hematoma definitely suspected at this point.  Warfarin currently on hold.  Per surgery patient will likely go home tomorrow and follow-up in the outpatient setting.  Continue with antibiotics empirically  #2 hypothyroidism: Continue with home regimen of levothyroxine.  #3 diabetes: Patient is a diabetic which raises possibility of infections.  Continue sliding  scale insulin with home regimen.  #4 hypertension: Blood pressure appears controlled.  Continue current regimen.  #5 history of pulmonary embolism: INR is supratherapeutic.  Once INR is less than 2 will resume Coumadin.  Check PT/INR daily  #6 acute kidney injury: Stable at this point.  Continue monitoring.  #7 history of lower bowel stricture: Patient had colostomy done With ongoing care.  Continue.   DVT prophylaxis: SCD Code Status: Full code Family Communication: No family currently available Disposition Plan:   Most likely home  Consultants:  General surgery, Dr. Maisie Fus Cornett  Procedures:   None   Antimicrobials:   Day #1 of Rocephin   Subjective: Patient has no new complaints.  INR not checked today.  She appears to be stable with no increase in the swelling on the breasts.  Objective: Vitals:   12/16/17 1936 12/17/17 0500 12/17/17 0535 12/17/17 1614  BP: (!) 139/57  133/79 (!) 163/87  Pulse: 87  88 87  Resp:      Temp: 98.6 F (37 C)  97.9 F (36.6 C) 98.4 F (36.9 C)  TempSrc: Oral  Oral Oral  SpO2: 100%  100% 100%  Weight:  78 kg    Height:       No intake or output data in the 24 hours ending 12/17/17 1857 Filed Weights   12/15/17 1700 12/17/17 0500  Weight: 78 kg 78 kg    Examination:  General exam: Appears calm and comfortable, in pain. Respiratory system: Clear to auscultation. Respiratory effort normal.  Cardiovascular system: S1 & S2 heard, RRR. No JVD, murmurs, rubs, gallops or clicks. No pedal edema. Gastrointestinal system: Abdomen is nondistended, soft and nontender. No organomegaly or masses felt. Normal bowel sounds heard. Central nervous system: Alert and oriented. No focal neurological deficits. Extremities: Symmetric 5 x 5 power. Skin: Left breast tissue swollen, right, indurated and tender Psychiatry: Judgement and insight appear normal. Mood & affect appropriate.     Data Reviewed: I have personally reviewed following labs  and imaging studies  CBC: Recent Labs  Lab 12/15/17 1718 12/15/17 1800 12/16/17 0303  WBC 15.6*  --  11.5*  NEUTROABS 10.8*  --   --   HGB 11.5* 12.9 8.9*  HCT 38.2 38.0 29.6*  MCV 90.7  --  89.4  PLT 307  --  272   Basic Metabolic Panel: Recent Labs  Lab 12/15/17 1800 12/16/17 0303  NA 138 140  K 3.8 4.1  CL 106 109  CO2  --  21*  GLUCOSE 143* 109*  BUN 28* 20  CREATININE 1.30* 1.13*  CALCIUM  --  8.4*   GFR: Estimated Creatinine Clearance: 45.6 mL/min (A) (by C-G formula based on SCr of 1.13 mg/dL (H)). Liver Function Tests: No results for input(s): AST, ALT, ALKPHOS, BILITOT, PROT, ALBUMIN in the last 168 hours. No results for input(s): LIPASE, AMYLASE in the last 168 hours. No results for input(s): AMMONIA in the last 168 hours. Coagulation Profile: Recent Labs  Lab 12/15/17 1718 12/16/17 0303  INR 3.48 2.35   Cardiac Enzymes: No results for input(s): CKTOTAL, CKMB, CKMBINDEX, TROPONINI in the last 168 hours. BNP (last 3 results) No results for input(s): PROBNP in the last 8760 hours. HbA1C: No results for input(s): HGBA1C in the last 72 hours. CBG: Recent Labs  Lab 12/16/17 1124 12/16/17 1619 12/16/17 2125 12/17/17 0544 12/17/17 1134  GLUCAP 71 132* 130* 111* 116*   Lipid Profile: No results for input(s): CHOL, HDL, LDLCALC, TRIG, CHOLHDL, LDLDIRECT in the last 72 hours. Thyroid Function Tests: No results for input(s): TSH, T4TOTAL, FREET4, T3FREE, THYROIDAB in the last 72 hours. Anemia Panel: No results for input(s): VITAMINB12, FOLATE, FERRITIN, TIBC, IRON, RETICCTPCT in the last 72 hours. Sepsis Labs: Recent Labs  Lab 12/15/17 1800 12/15/17 2122  LATICACIDVEN 2.27* 0.86    Recent Results (from the past 240 hour(s))  Surgical PCR screen     Status: None   Collection Time: 12/16/17 12:58 AM  Result Value Ref Range Status   MRSA, PCR NEGATIVE NEGATIVE Final   Staphylococcus aureus NEGATIVE NEGATIVE Final    Comment: (NOTE) The Xpert  SA Assay (FDA approved for NASAL specimens in patients 10 years of age and older), is one component of a comprehensive surveillance program. It is not intended to diagnose infection nor to guide or monitor treatment. Performed at [facility], [address], [place], [place] [address]          Radiology Studies: Ct Chest W Contrast  Result Date: 12/15/2017 CLINICAL DATA:  Axillary cellulitis, pain starting last night EXAM: CT CHEST WITH CONTRAST TECHNIQUE: Multidetector CT imaging of the chest was performed during intravenous contrast administration. CONTRAST:  75mL OMNIPAQUE IOHEXOL 300 MG/ML  SOLN COMPARISON:  CT scan 08/05/2012 chest radiograph ball/13/19 FINDINGS: Cardiovascular: Coronary, aortic arch, and branch vessel atherosclerotic vascular disease. Mild calcification of the posterior leaflet of the mitral valve. Mediastinum/Nodes: Unremarkable Lungs/Pleura: Bandlike density in the right lower lobe is thought to be due to atelectasis rather than a true pulmonary nodule.  Upper Abdomen: Hypodense lesion in the right kidney upper pole is technically nonspecific although statistically likely to be a benign cyst. Mild nodularity left adrenal gland is similar to the 10/24/2017 exam but otherwise nonspecific. Musculoskeletal: In the left breast we partially image a proximally 13.2 by 4.9 by 6.0 cm (volume = 200 cm^3) complex fluid collection with dependent higher density components and some suspected internal septation. The upper margin of this process extends slightly above the top image of today's CT scan. This is vertically oriented and appears to partially extend between the pectoralis minor and major muscle laterally on the left, and also involve the axilla. There is considerable surrounding edema in the left breast and axilla and tracking down the left chest wall. Thoracic spondylosis noted with multilevel bridging spurring. I do not see a discrete rib fracture. IMPRESSION: 1. At least  200 cubic cm complex fluid collection in the left lateral breast oriented vertically. This appears to have some sort of internal fluid-fluid level possibly a hematoma, conceivably a breast abscess. There is notable surrounding subcutaneous edema in the breast and axilla. Please note that the upper margin of this process is not completely included on today's exam. While I am skeptical that this lesion is directly related to the axillary artery or vein, the segments of these vessels which would be close to the mass are not included on imaging of today's chest CT as they are above the axis of the top most image. If clinically warranted, sonography could be utilized to further assess the upper margin of the breast lesion particularly in relation to the axillary artery and vein. However, if the patient's diagnosis is clinically obvious, for example an obvious mastitis with fluctuant abscess, then sonographic workup may not be indicated. 2. Aortic Atherosclerosis (ICD10-I70.0). Coronary atherosclerosis. Mild mitral valve calcification. Electronically Signed   By: Gaylyn Rong M.D.   On: 12/15/2017 20:26        Scheduled Meds: . insulin aspart  0-5 Units Subcutaneous QHS  . insulin aspart  0-9 Units Subcutaneous TID WC  . levothyroxine  25 mcg Oral QAC breakfast  . metoprolol tartrate  25 mg Oral BID  . polyethylene glycol  17 g Oral QODAY  . simvastatin  20 mg Oral QHS   Continuous Infusions: . cefTRIAXone (ROCEPHIN)  IV 1 g (12/17/17 0011)     LOS: 1 day    Time spent: 33 minutes    ,LAWAL, MD [facility] Pager [phone]  If 7PM-7AM, please contact night-coverage [email or web address] Password [facility] 12/17/2017, 6:57 PM

## 2017-12-18 DIAGNOSIS — Z86711 Personal history of pulmonary embolism: Secondary | ICD-10-CM

## 2017-12-18 DIAGNOSIS — E1140 Type 2 diabetes mellitus with diabetic neuropathy, unspecified: Secondary | ICD-10-CM

## 2017-12-18 DIAGNOSIS — L0390 Cellulitis, unspecified: Secondary | ICD-10-CM

## 2017-12-18 DIAGNOSIS — N61 Mastitis without abscess: Secondary | ICD-10-CM

## 2017-12-18 DIAGNOSIS — N179 Acute kidney failure, unspecified: Secondary | ICD-10-CM

## 2017-12-18 DIAGNOSIS — K56699 Other intestinal obstruction unspecified as to partial versus complete obstruction: Secondary | ICD-10-CM

## 2017-12-18 DIAGNOSIS — E038 Other specified hypothyroidism: Secondary | ICD-10-CM

## 2017-12-18 DIAGNOSIS — I10 Essential (primary) hypertension: Secondary | ICD-10-CM

## 2017-12-18 LAB — HEMOGLOBIN AND HEMATOCRIT, BLOOD
HCT: 28.8 % — ABNORMAL LOW (ref 36.0–46.0)
Hemoglobin: 8.8 g/dL — ABNORMAL LOW (ref 12.0–15.0)

## 2017-12-18 LAB — GLUCOSE, CAPILLARY
GLUCOSE-CAPILLARY: 108 mg/dL — AB (ref 70–99)
GLUCOSE-CAPILLARY: 103 mg/dL — AB (ref 70–99)
Glucose-Capillary: 137 mg/dL — ABNORMAL HIGH (ref 70–99)
Glucose-Capillary: 96 mg/dL (ref 70–99)

## 2017-12-18 LAB — PROTIME-INR
INR: 1.16
PROTHROMBIN TIME: 14.7 s (ref 11.4–15.2)

## 2017-12-18 MED ORDER — DOXYCYCLINE HYCLATE 100 MG PO TABS: 100.0000 mg | ORAL_TABLET | Freq: Two times a day (BID) | ORAL | 0 refills | Status: DC

## 2017-12-18 NOTE — Consult Note (Signed)
WOC Nurse ostomy follow up I have requested the Unit Secretary order two of each of the following ostomy products:  Hart [place] #649 and Hart [place] #2.  I cannot determine for the chart notes if the patient will be discharged today. Helmut Muster, RN, MSN, CWOCN, CNS-BC, pager [phone]

## 2017-12-18 NOTE — Consult Note (Signed)
WOC Nurse ostomy follow up Patient receiving care in [facility].  No family present.  Patient states she is to be discharged home today.  She also states the pouch she has in place to the LUQ colostomy, is the one she applied yesterday. Stoma type/location: colostomy Stomal assessment/size: Not measured Peristomal assessment: Deferred Output:  Pouch is empty at the time of my visit Ostomy pouching: 2pc., 2 and 3/4 inch flat [place].  Lawson # [phone] for the pouch, and #2 for the pouch skin barrier. This is a long established ostomy that the patient provides all the care for.  Helmut Muster, RN, MSN, CWOCN, CNS-BC, pager [phone]

## 2017-12-18 NOTE — Care Management (Addendum)
12/18/2017  17:00pm CM received callback from pts daughter on behalf of mom - daughter confirmed pt is active with [facility].  Attending agreed to write  resumption/face to face orders.  Per daughter - pt is independent from home.   14:26pm Pt discharged prior to CM assessment - CM left voicemail for pt requesting call back. CM informed post discharge that pt is actually active with [facility] for HHPT.  CM requested attending to write HHPT resumption order and face to face.

## 2017-12-18 NOTE — Discharge Instructions (Signed)
Hematoma A hematoma is a collection of blood. The collection of blood can turn into a hard, painful lump under the skin. Your skin may turn blue or yellow if the hematoma is close to the surface of the skin. Most hematomas get better in a few days to weeks. Some hematomas are serious and need medical care. Hematomas can be very small or very big. Follow these instructions at home:  Apply ice to the injured area: ? Put ice in a plastic bag. ? Place a towel between your skin and the bag. ? Leave the ice on for 20 minutes, 2-3 times a day for the first 1 to 2 days.  After the first 2 days, switch to using warm packs on the injured area.  Raise (elevate) the injured area to lessen pain and puffiness (swelling). You may also wrap the area with an elastic bandage. Make sure the bandage is not wrapped too tight.  If you have a painful hematoma on your leg or foot, you may use crutches for a couple days.  Only take medicines as told by your doctor. Get help right away if:  Your pain gets worse.  Your pain is not controlled with medicine.  You have a fever.  Your puffiness gets worse.  Your skin turns more blue or yellow.  Your skin over the hematoma breaks or starts bleeding.  Your hematoma is in your chest or belly (abdomen) and you are short of breath, feel weak, or have a change in consciousness.  Your hematoma is on your scalp and you have a headache that gets worse or a change in alertness or consciousness. This information is not intended to replace advice given to you by your health care provider. Make sure you discuss any questions you have with your health care provider. Document Released: 01/28/2004 Document Revised: 05/28/2015 Document Reviewed: 05/30/2012 Elsevier Interactive Patient Education  2017 [employer]  

## 2017-12-18 NOTE — Progress Notes (Signed)
HPI:  Using dictation device. Unfortunately this device frequently misinterprets words/phrases.  Hannah [name] is a pleasant 77 y.o. with a PMH significant for hx here for a hx of PE on Coumadin (manage by cardiology), diabetes, HTN and s/p colectomy here for hospital follow up. See transitional care phone note in Epic. Per review of discharge documents and patient: Hospitalized 12/13-12/16/19 Primary admitting complaint(s): hematoma breast with overlying cellulitis - s/p fall (with hosp visit prior for that and UTI) w/ supratherapeutic INR Primary admitting diagnosis (es) and treatment: fluid collection L breast - felt to be hematoma, surgery consulted and advise abx, holding coumadin x 2 weeks and breast binder with outpt surgery f/u Other significant diagnosis (es) and treatment: anemia - felt 2ndary to hematoma per notes; mild cr elevation; supratherapeutic INR - given Vit K and coumadin held per surgery Follow up concerns per discharge document:cbc/bmp in 1week after discharge, follow up general surgery (Dr. Luisa Hart), hold coumadin x 2 weeks. Reports today: doing better with decreased discoloration/pain and swelling of the breast, still feels weak from prior hospitalization (not worsened) and doing PT Denies:bleeding, abd pain, cp, sob, swelling, dizziness, lightheadedness, palpitations, worsening weakness, further falls, dysuria, poor appetite -plans to see coumadin clinic tomorrow and reports is to call surgeon today for follow up this week - reports he saw her yesterday in the hospital   ROS: See pertinent positives and negatives per HPI.  Past Medical History:  Diagnosis Date  . Acute massive pulmonary embolism (HCC) 07/13/2012   Massive PE w/ PEA arrest 07/13/12 >TNK >IVC filter >discharged on comadin    . Diabetes mellitus, type 2 (HCC) 07/15/2012   with peripheral neuropathy  . Hyperlipemia 07/14/2012  . Hypertension 07/14/2012  . Large bowel stricture (HCC)    s/p colectomy in  2016  . Osteoarthritis    s/p hip and knee replacements  . Thyroid disease     Past Surgical History:  Procedure Laterality Date  . COLONOSCOPY N/A 03/12/2016   Procedure: COLONOSCOPY;  Surgeon: Dorena Cookey, MD;  Location: [facility] ENDOSCOPY;  Service: Endoscopy;  Laterality: N/A;  . COLOSTOMY N/A 06/03/2014   Procedure: COLOSTOMY;  Surgeon: Harriette Bouillon, MD;  Location: [facility] OR;  Service: General;  Laterality: N/A;  . FLEXIBLE SIGMOIDOSCOPY N/A 05/30/2014   Procedure: Arnell Sieving;  Surgeon: Jeani Hawking, MD;  Location: [facility] ENDOSCOPY;  Service: Endoscopy;  Laterality: N/A;  . INSERTION OF VENA CAVA FILTER N/A 07/16/2012   Procedure: INSERTION OF VENA CAVA FILTER;  Surgeon: [name], MD;  Location: [facility] CATH LAB;  Service: Cardiovascular;  Laterality: N/A;  . KNEE ARTHROSCOPY    . PARTIAL COLECTOMY N/A 06/03/2014   Procedure: PARTIAL COLECTOMY;  Surgeon: Harriette Bouillon, MD;  Location: [facility] OR;  Service: General;  Laterality: N/A;  . REDUCTION MAMMAPLASTY Bilateral   . SP ARTHRO HIP*L*      Family History  Problem Relation Age of Onset  . Breast cancer Mother     SOCIAL HX: see hpi   Current Outpatient Medications:  .  acetaminophen (TYLENOL) 500 MG tablet, Take 500 mg by mouth every 6 (six) hours as needed for mild pain. , Disp: , Rfl:  .  doxycycline (VIBRA-TABS) 100 MG tablet, Take 1 tablet (100 mg total) by mouth 2 (two) times daily., Disp: 8 tablet, Rfl: 0 .  furosemide (LASIX) 20 MG tablet, Take 1 tablet (20 mg total) by mouth daily. (Patient taking differently: Take 20 mg by mouth as needed for fluid or edema. ), Disp: 30 tablet,  Rfl: 3 .  glucose blood (ONETOUCH VERIO) test strip, Use as instructed once a day, Disp: 100 each, Rfl: 5 .  glucose blood test strip, Use as instructed once a day, Disp: 100 each, Rfl: 3 .  levothyroxine (SYNTHROID, LEVOTHROID) 25 MCG tablet, Take 1 tablet by mouth  daily before breakfast (Patient taking differently: Take 25 mcg by mouth daily  before breakfast. ), Disp: 90 tablet, Rfl: 1 .  metFORMIN (GLUCOPHAGE) 1000 MG tablet, TAKE ONE TABLET BY MOUTH WITH BREAKFAST AND TAKE ONE TABLET WITH DINNER (Patient taking differently: Take 1,000 mg by mouth 2 (two) times daily with a meal. ), Disp: 180 tablet, Rfl: 1 .  metoprolol tartrate (LOPRESSOR) 25 MG tablet, Take 1 tablet (25 mg total) by mouth 2 (two) times daily., Disp: 180 tablet, Rfl: 1 .  ONETOUCH DELICA LANCETS FINE MISC, USE ONCE DAILY., Disp: 100 each, Rfl: 3 .  polyethylene glycol (MIRALAX / GLYCOLAX) packet, Take 17 g by mouth daily. (Patient taking differently: Take 17 g by mouth every other day. ), Disp: 14 each, Rfl: 0 .  simvastatin (ZOCOR) 20 MG tablet, Take 1 tablet (20 mg total) by mouth at bedtime., Disp: 90 tablet, Rfl: 3 .  traMADol (ULTRAM) 50 MG tablet, Take 1 tablet (50 mg total) by mouth every 6 (six) hours as needed for moderate pain., Disp: 15 tablet, Rfl: 0 .  vitamin C (ASCORBIC ACID) 500 MG tablet, Take 500 mg by mouth daily. , Disp: , Rfl:   EXAM:  Vitals:   12/19/17 0904  BP: 122/80  Pulse: 86  Temp: 98.1 F (36.7 C)  SpO2: 97%    Body mass index is 25.75 kg/m.  GENERAL: vitals reviewed and listed above, alert, oriented, appears well hydrated and in no acute distress  HEENT: atraumatic, conjunttiva clear, no obvious abnormalities on inspection of external nose and ears  NECK: no obvious masses on inspection  LUNGS: clear to auscultation bilaterally, no wheezes, rales or rhonchi, good air movement  CV: HRRR, no peripheral edema  SKIN: firm fluid collection L breast large, no overlying warmth or induration - significant brusing  MS: moves all extremities without noticeable abnormality  PSYCH: pleasant and cooperative, no obvious depression or anxiety  ASSESSMENT AND PLAN:  Discussed the following assessment and plan: More than 50% of over 40 minutes spent in total in caring for this patient was spent face-to-face with the patient,  counseling and/or coordinating care.    Fall at home, initial encounter  Mass of left breast  Anticoagulated on warfarin  Anemia, unspecified type  -seems to be doing well -still week and doing PT at home per her report -advised repeat BMP/CBC in 1 week per hospital discharge recommendations and orders placed -reports she is to see cardiology coumadin clinic tomorrow - holding coumadin and metformin for now - advised to restart coumadin per surgeon and coumadin clinic recs -restart metformin after bmp next week if looking good, monitor sugars in interim  -healthy iron rich diet and plenty of fluids advised -fall precuations -follow up 3 months, sooner as needed -Patient advised to return or notify a doctor immediately if symptoms worsen or persist or new concerns arise.  Patient Instructions  BEFORE YOU LEAVE: -lab appointment in 1 week to recheck CBC and BMP -follow up: with Dr. [name] in 3 months  CALL your surgeon's office today to schedule follow up in the next 1 week.  CALL your coumadin/cardiology clinic today for follow up.  Eat a healthy diet with  iron rich foods included daily  Be very careful with ambulating and use walker or cane at all times  See care immediately if feeling worse, new concerns or you are not continuing to improve. I hope you feel all better soon.     [name], DO

## 2017-12-18 NOTE — Discharge Summary (Signed)
Physician Discharge Summary  Hannah [name] LFY:[id number] DOB: Jul 13, 1940 DOA: 12/15/2017  PCP: [name], DO  Admit date: 12/15/2017 Discharge date: 12/18/2017  Time spent: 45 minutes  Recommendations for Outpatient Follow-up:  Patient will be discharged to home.  Patient will need to follow up with primary care provider within one week of discharge, repeat CBC, BMP. Follow up with general surgery, Dr. Luisa Hart. Hold coumadin for 2 weeks.  Patient should continue medications as prescribed.  Patient should follow a soft diet- advance to heart healthy/carb modified diet.  Discharge Diagnoses:  Left breast hematoma with overlying nonpurulent cellulitis Anemia secondary to acute blood loss Supratherapeutic INR Acute kidney injury Diabetes mellitus, type II Essential hypertension Hypothyroidism  History of large bowel stricture  Discharge Condition: Stable  Diet recommendation: soft to heart healthy/carb modified   Filed Weights   12/15/17 1700 12/17/17 0500  Weight: 78 kg 78 kg    History of present illness:  On 12/15/2017 by Dr. Darreld Mclean Hannah [name] is a 77 y.o. female with medical history significant for Hx of PE on Coumadin, T2DM, HTN, HLD, hypothyroidism, and large bowel stricture s/p colectomy with colostomy who presents to the ED with left breast swelling.  Patient was recently admitted from 12/07/17-12/08/2017 after a fall at home.  She was diagnosed and treated for UTI and discharged to home with home health physical therapy.  Last night she noticed swelling at her left breast with dark discoloration at the skin.  She is unsure if this was present in between her recent hospitalization and yesterday.  Today she noticed that the overlying skin turned red.  She has had associated pain without discharge or open wound.  She has been on Coumadin for history of PE with last dose taken last night.  She denies any fevers, chills, diaphoresis, chest pain, dyspnea, abdominal pain, or  dysuria.  Hospital Course:  Left breast hematoma with overlying nonpurulent cellulitis -CT chest: at least 200cc complex fluid collection left lateral breast- possibly hematoma -on exam, noted to have hematoma on left breast extending to left abdomen -was initially placed on vancomycin and ceftriaxone- will discharge with doxycyclin -General surgery consulted and appreciated, recommended holding Coumadin for 2 weeks and using a breast binder for comfort.  Outpatient follow-up. -Patient was on Coumadin which should be held for approximately 2 weeks  Anemia secondary to acute blood loss  -hemoglobin was 11.5 on admission, however dropped to 8.8 (stable for a couple of days) -baseline hemoglobin 11-12 -suspect secondary to blood loss due to hematoma  -repeat CBC in one week  Supratherapeutic INR -In the setting of Coumadin use for history of PE -INR admission 3.48, patient was given vitamin K IV 5 mg once in the emergency department -INR currently 1.16  Acute kidney injury -Creatinine 1.3 on admission- prior to admission creatinine 1.2, however upon review of chart, creatinine 0.8-0.9 -creatinine down to 1.13 -Repeat BMP in one week  Diabetes mellitus, type II -Continue metformin -Last hemoglobin A1c 6.4  Essential hypertension -Continue Lopressor -hold lisinopril   Hypothyroidism  -Continue Synthroid  History of large bowel stricture -Status post colectomy with colostomy -Continue ostomy care  Procedures: None  Consultations: General surgery  Discharge Exam: Vitals:   12/18/17 1046 12/18/17 1323  BP: (!) 144/73 137/72  Pulse: 74 80  Resp:  16  Temp:  98.2 F (36.8 C)  SpO2:  100%     General: Well developed, well nourished, NAD, appears stated age  HEENT: NCAT, mucous membranes moist.  Neck: Supple  Breast: left breast TTP with induration, edema/erythema on left outer upper/lower quadrants ext to abdomen  Cardiovascular: S1 S2 auscultated,  RRR  Respiratory: Clear to auscultation bilaterally with equal chest rise  Abdomen: Soft, nontender, nondistended, + bowel sounds, +ostomy  Extremities: warm dry without cyanosis clubbing or edema  Neuro: AAOx3, nonfocal  Psych: Normal affect and demeanor, pleasant   Discharge Instructions Discharge Instructions    Discharge instructions   Complete by:  As directed    Patient will be discharged to home.  Patient will need to follow up with primary care provider within one week of discharge, repeat CBC, BMP. Follow up with general surgery, Dr. Luisa Hart. Hold coumadin for 2 weeks.  Patient should continue medications as prescribed.  Patient should follow a soft diet- advance to heart healthy/carb modified diet.     Allergies as of 12/18/2017      Reactions   Augmentin [amoxicillin-pot Clavulanate] Itching, Other (See Comments)   Severe vaginal itching   Tranxene [clorazepate] Itching   Penicillins Itching, Rash   Has patient had a PCN reaction causing immediate rash, facial/tongue/throat swelling, SOB or lightheadedness with hypotension: Yes Has patient had a PCN reaction causing severe rash involving mucus membranes or skin necrosis: No Has patient had a PCN reaction that required hospitalization: No Has patient had a PCN reaction occurring within the last 10 years: No If all of the above answers are "NO", then may proceed with Cephalosporin use.      Medication List    STOP taking these medications   lisinopril 40 MG tablet Commonly known as:  PRINIVIL,ZESTRIL   warfarin 5 MG tablet Commonly known as:  COUMADIN     TAKE these medications   acetaminophen 500 MG tablet Commonly known as:  TYLENOL Take 500 mg by mouth every 6 (six) hours as needed for mild pain.   doxycycline 100 MG tablet Commonly known as:  VIBRA-TABS Take 1 tablet (100 mg total) by mouth 2 (two) times daily.   furosemide 20 MG tablet Commonly known as:  LASIX Take 1 tablet (20 mg total) by mouth  daily. What changed:    when to take this  reasons to take this   glucose blood test strip Use as instructed once a day   glucose blood test strip Commonly known as:  ONETOUCH VERIO Use as instructed once a day   levothyroxine 25 MCG tablet Commonly known as:  SYNTHROID, LEVOTHROID Take 1 tablet by mouth  daily before breakfast What changed:    how much to take  how to take this  when to take this  additional instructions   metFORMIN 1000 MG tablet Commonly known as:  GLUCOPHAGE TAKE ONE TABLET BY MOUTH WITH BREAKFAST AND TAKE ONE TABLET WITH DINNER What changed:    how much to take  how to take this  when to take this  additional instructions   metoprolol tartrate 25 MG tablet Commonly known as:  LOPRESSOR Take 1 tablet (25 mg total) by mouth 2 (two) times daily.   ONETOUCH DELICA LANCETS FINE Misc USE ONCE DAILY.   polyethylene glycol packet Commonly known as:  MIRALAX / GLYCOLAX Take 17 g by mouth daily. What changed:  when to take this   simvastatin 20 MG tablet Commonly known as:  ZOCOR Take 1 tablet (20 mg total) by mouth at bedtime.   traMADol 50 MG tablet Commonly known as:  ULTRAM Take 1 tablet (50 mg total) by mouth every 6 (six) hours  as needed for moderate pain.   vitamin C 500 MG tablet Commonly known as:  ASCORBIC ACID Take 500 mg by mouth daily.      Allergies  Allergen Reactions  . Augmentin [Amoxicillin-Pot Clavulanate] Itching and Other (See Comments)    Severe vaginal itching  . Tranxene [Clorazepate] Itching  . Penicillins Itching and Rash    Has patient had a PCN reaction causing immediate rash, facial/tongue/throat swelling, SOB or lightheadedness with hypotension: Yes Has patient had a PCN reaction causing severe rash involving mucus membranes or skin necrosis: No Has patient had a PCN reaction that required hospitalization: No Has patient had a PCN reaction occurring within the last 10 years: No If all of the above  answers are "NO", then may proceed with Cephalosporin use.    Follow-up Information    [name], DO. Schedule an appointment as soon as possible for a visit in 1 week(s).   Specialty:  Family Medicine Why:  Hospital follow up, discuss lisinopril and repeat CBC and BMP Contact information: [address] [place] [address] [phone]        Harriette Bouillon, MD. Schedule an appointment as soon as possible for a visit in 1 week(s).   Specialty:  General Surgery Why:  Hospital follow up Contact information: [address] [place] [address] [phone]            The results of significant diagnostics from this hospitalization (including imaging, microbiology, ancillary and laboratory) are listed below for reference.    Significant Diagnostic Studies: Dg Chest 2 View  Result Date: 12/15/2017 CLINICAL DATA:  Bruising under the left breast EXAM: CHEST - 2 VIEW COMPARISON:  12/07/2017 FINDINGS: The heart size and mediastinal contours are within normal limits. Both lungs are clear. Degenerative osteophytes of the spine. IMPRESSION: No active cardiopulmonary disease. Electronically Signed   By: Jasmine Pang M.D.   On: 12/15/2017 18:20   Dg Chest 2 View  Result Date: 12/07/2017 CLINICAL DATA:  Patient fell and is on warfarin.  Leukocytosis. EXAM: CHEST - 2 VIEW COMPARISON:  03/14/2017 CXR FINDINGS: Heart size and mediastinal contours are stable with aortic atherosclerosis. No aneurysm is noted. No pulmonary contusion, effusion or pneumothorax. No acute displaced rib fracture nor osseous abnormality. Degenerative changes are present along the dorsal spine. IMPRESSION: No active cardiopulmonary disease. Aortic atherosclerosis (ICD10-I70.0) Electronically Signed   By: Tollie Eth M.D.   On: 12/07/2017 14:15   Dg Sacrum/coccyx  Result Date: 12/07/2017 CLINICAL DATA:  Hip pain after fall. EXAM: SACRUM AND COCCYX - 2+ VIEW COMPARISON:  None. FINDINGS:  There is no evidence of fracture or other focal bone lesions. IMPRESSION: Negative. Electronically Signed   By: Lupita Raider, M.D.   On: 12/07/2017 13:27   Ct Head Wo Contrast  Result Date: 12/07/2017 CLINICAL DATA:  Head trauma EXAM: CT HEAD WITHOUT CONTRAST TECHNIQUE: Contiguous axial images were obtained from the base of the skull through the vertex without intravenous contrast. COMPARISON:  Report from 03/17/2001 FINDINGS: BRAIN: Sulcal and ventricular prominence consistent with atrophy is noted. No intraparenchymal hemorrhage, mass effect nor midline shift. No acute large vascular territory infarcts. Small vessel ischemic disease of periventricular white matter are identified, mild-to-moderate. Basal cisterns are not effaced and midline. The brainstem and cerebellar hemispheres are without acute abnormalities. VASCULAR: No hyperdense vessel sign. SKULL/SOFT TISSUES: No skull fracture. No significant soft tissue swelling. ORBITS/SINUSES: The included ocular globes and orbital contents are normal.The mastoid air cells are  clear. Small mucous retention cyst in the right maxillary sinus. The included paranasal sinuses are well-aerated. OTHER: None. IMPRESSION: Atrophy with chronic appearing small vessel ischemic disease. No acute intracranial abnormality. Electronically Signed   By: Tollie Eth M.D.   On: 12/07/2017 13:46   Ct Chest W Contrast  Result Date: 12/15/2017 CLINICAL DATA:  Axillary cellulitis, pain starting last night EXAM: CT CHEST WITH CONTRAST TECHNIQUE: Multidetector CT imaging of the chest was performed during intravenous contrast administration. CONTRAST:  75mL OMNIPAQUE IOHEXOL 300 MG/ML  SOLN COMPARISON:  CT scan 08/05/2012 chest radiograph ball/13/19 FINDINGS: Cardiovascular: Coronary, aortic arch, and branch vessel atherosclerotic vascular disease. Mild calcification of the posterior leaflet of the mitral valve. Mediastinum/Nodes: Unremarkable Lungs/Pleura: Bandlike density in the  right lower lobe is thought to be due to atelectasis rather than a true pulmonary nodule. Upper Abdomen: Hypodense lesion in the right kidney upper pole is technically nonspecific although statistically likely to be a benign cyst. Mild nodularity left adrenal gland is similar to the 10/24/2017 exam but otherwise nonspecific. Musculoskeletal: In the left breast we partially image a proximally 13.2 by 4.9 by 6.0 cm (volume = 200 cm^3) complex fluid collection with dependent higher density components and some suspected internal septation. The upper margin of this process extends slightly above the top image of today's CT scan. This is vertically oriented and appears to partially extend between the pectoralis minor and major muscle laterally on the left, and also involve the axilla. There is considerable surrounding edema in the left breast and axilla and tracking down the left chest wall. Thoracic spondylosis noted with multilevel bridging spurring. I do not see a discrete rib fracture. IMPRESSION: 1. At least 200 cubic cm complex fluid collection in the left lateral breast oriented vertically. This appears to have some sort of internal fluid-fluid level possibly a hematoma, conceivably a breast abscess. There is notable surrounding subcutaneous edema in the breast and axilla. Please note that the upper margin of this process is not completely included on today's exam. While I am skeptical that this lesion is directly related to the axillary artery or vein, the segments of these vessels which would be close to the mass are not included on imaging of today's chest CT as they are above the axis of the top most image. If clinically warranted, sonography could be utilized to further assess the upper margin of the breast lesion particularly in relation to the axillary artery and vein. However, if the patient's diagnosis is clinically obvious, for example an obvious mastitis with fluctuant abscess, then sonographic workup  may not be indicated. 2. Aortic Atherosclerosis (ICD10-I70.0). Coronary atherosclerosis. Mild mitral valve calcification. Electronically Signed   By: Gaylyn Rong M.D.   On: 12/15/2017 20:26   Dg Hips Bilat W Or Wo Pelvis 3-4 Views  Result Date: 12/07/2017 CLINICAL DATA:  Bilateral hip pain after fall today. EXAM: DG HIP (WITH OR WITHOUT PELVIS) 3-4V BILAT COMPARISON:  Radiographs of February 06, 2017. FINDINGS: Status post left total hip arthroplasty. Right hip appears normal. No fracture or dislocation is noted. Sacroiliac joints are unremarkable. IMPRESSION: Status post left hip arthroplasty. No acute abnormality seen in either hip. Electronically Signed   By: Lupita Raider, M.D.   On: 12/07/2017 13:26    Microbiology: Recent Results (from the past 240 hour(s))  Surgical PCR screen     Status: None   Collection Time: 12/16/17 12:58 AM  Result Value Ref Range Status   MRSA, PCR NEGATIVE NEGATIVE Final  Staphylococcus aureus NEGATIVE NEGATIVE Final    Comment: (NOTE) The Xpert SA Assay (FDA approved for NASAL specimens in patients 74 years of age and older), is one component of a comprehensive surveillance program. It is not intended to diagnose infection nor to guide or monitor treatment. Performed at [facility], [address], [place], [place] [address]      Labs: Basic Metabolic Panel: Recent Labs  Lab 12/15/17 1800 12/16/17 0303  NA 138 140  K 3.8 4.1  CL 106 109  CO2  --  21*  GLUCOSE 143* 109*  BUN 28* 20  CREATININE 1.30* 1.13*  CALCIUM  --  8.4*   Liver Function Tests: No results for input(s): AST, ALT, ALKPHOS, BILITOT, PROT, ALBUMIN in the last 168 hours. No results for input(s): LIPASE, AMYLASE in the last 168 hours. No results for input(s): AMMONIA in the last 168 hours. CBC: Recent Labs  Lab 12/15/17 1718 12/15/17 1800 12/16/17 0303 12/18/17 1111  WBC 15.6*  --  11.5*  --   NEUTROABS 10.8*  --   --   --   HGB 11.5* 12.9 8.9* 8.8*   HCT 38.2 38.0 29.6* 28.8*  MCV 90.7  --  89.4  --   PLT 307  --  272  --    Cardiac Enzymes: No results for input(s): CKTOTAL, CKMB, CKMBINDEX, TROPONINI in the last 168 hours. BNP: BNP (last 3 results) No results for input(s): BNP in the last 8760 hours.  ProBNP (last 3 results) No results for input(s): PROBNP in the last 8760 hours.  CBG: Recent Labs  Lab 12/17/17 0544 12/17/17 1134 12/17/17 1733 12/18/17 0616 12/18/17 1146  GLUCAP 111* 116* 137* 96 103*       Signed:  Zalmen Wrightsman  [facility] 12/18/2017, 2:59 PM

## 2017-12-18 NOTE — Progress Notes (Signed)
Patient ID: Hannah [name], female   DOB: 1940/04/24, 77 y.o.   MRN: [id number]       Subjective: Sitting in chair, no new complaints except pain still in her left breast.  Objective: Vital signs in last 24 hours: Temp:  [97.7 F (36.5 C)-98.4 F (36.9 C)] 97.7 F (36.5 C) (12/16 0507) Pulse Rate:  [74-87] 74 (12/16 1046) BP: (132-163)/(66-87) 144/73 (12/16 1046) SpO2:  [100 %] 100 % (12/16 0507) Last BM Date: 12/16/17(colostomy)  Intake/Output from previous day: No intake/output data recorded. Intake/Output this shift: No intake/output data recorded.  PE: Chest/breast: left breast with significant hematoma especially of upper outer breast and the left side in general.  This is tender as expected.  No evidence of skin necrosis, etc.  Right breast is stable.  Lab Results:  Recent Labs    12/15/17 1718 12/15/17 1800 12/16/17 0303  WBC 15.6*  --  11.5*  HGB 11.5* 12.9 8.9*  HCT 38.2 38.0 29.6*  PLT 307  --  272   BMET Recent Labs    12/15/17 1800 12/16/17 0303  NA 138 140  K 3.8 4.1  CL 106 109  CO2  --  21*  GLUCOSE 143* 109*  BUN 28* 20  CREATININE 1.30* 1.13*  CALCIUM  --  8.4*   PT/INR Recent Labs    12/16/17 0303 12/18/17 0326  LABPROT 25.4* 14.7  INR 2.35 1.16   CMP     Component Value Date/Time   NA 140 12/16/2017 0303   K 4.1 12/16/2017 0303   CL 109 12/16/2017 0303   CO2 21 (L) 12/16/2017 0303   GLUCOSE 109 (H) 12/16/2017 0303   BUN 20 12/16/2017 0303   CREATININE 1.13 (H) 12/16/2017 0303   CALCIUM 8.4 (L) 12/16/2017 0303   PROT 6.7 10/24/2017 0529   ALBUMIN 3.5 10/24/2017 0529   AST 19 10/24/2017 0529   ALT 11 10/24/2017 0529   ALKPHOS 48 10/24/2017 0529   BILITOT 0.6 10/24/2017 0529   GFRNONAA 47 (L) 12/16/2017 0303   GFRAA 55 (L) 12/16/2017 0303   Lipase     Component Value Date/Time   LIPASE 35 10/24/2017 0529       Studies/Results: No results found.  Anti-infectives: Anti-infectives (From admission, onward)   Start      Dose/Rate Route Frequency Ordered Stop   12/16/17 0145  cefTRIAXone (ROCEPHIN) 1 g in sodium chloride 0.9 % 100 mL IVPB     1 g 200 mL/hr over 30 Minutes Intravenous Every 24 hours 12/16/17 0142     12/15/17 2030  vancomycin (VANCOCIN) 1,500 mg in sodium chloride 0.9 % 500 mL IVPB  Status:  Discontinued     1,500 mg 250 mL/hr over 120 Minutes Intravenous Every 24 hours 12/15/17 1950 12/16/17 0142   12/15/17 1830  vancomycin (VANCOCIN) 1,500 mg in sodium chloride 0.9 % 500 mL IVPB  Status:  Discontinued     1,500 mg 250 mL/hr over 120 Minutes Intravenous  Once 12/15/17 1827 12/15/17 1950       Assessment/Plan  left breast hematoma, on coumadin  -coumadin on hold.  INR finally subtherapeutic. -H/H pending.  Last hgb was from 2 days ago when it has dropped from 12 to 8.   -may use a breast binder for comfort. -will need to hold coumadin for at least a couple of weeks if able. -will follow  FEN - soft diet VTE - on hold due to hematoma ID - rocephin Follow up - Dr. Luisa Hart  LOS: 2 days    Letha Cape , [facility] 12/18/2017, 11:31 AM Pager: [phone]

## 2017-12-18 NOTE — Progress Notes (Signed)
AVS given and reviewed with pt. Medications discussed. All questions answered to satisfaction. Pt escorted off the unit via wheelchair by staff member.

## 2017-12-18 NOTE — Plan of Care (Signed)

## 2017-12-19 ENCOUNTER — Ambulatory Visit: Payer: Medicare Other

## 2017-12-19 ENCOUNTER — Ambulatory Visit (INDEPENDENT_AMBULATORY_CARE_PROVIDER_SITE_OTHER): Payer: Medicare Other | Admitting: Family Medicine

## 2017-12-19 ENCOUNTER — Telehealth: Payer: Self-pay

## 2017-12-19 ENCOUNTER — Encounter: Payer: Self-pay | Admitting: Family Medicine

## 2017-12-19 ENCOUNTER — Telehealth: Payer: Self-pay | Admitting: Family Medicine

## 2017-12-19 VITALS — BP 122/80 | HR 86 | Temp 98.1°F | Ht 67.0 in | Wt 164.4 lb

## 2017-12-19 DIAGNOSIS — D649 Anemia, unspecified: Secondary | ICD-10-CM | POA: Diagnosis not present

## 2017-12-19 DIAGNOSIS — Z96659 Presence of unspecified artificial knee joint: Secondary | ICD-10-CM | POA: Diagnosis not present

## 2017-12-19 DIAGNOSIS — I110 Hypertensive heart disease with heart failure: Secondary | ICD-10-CM | POA: Diagnosis not present

## 2017-12-19 DIAGNOSIS — W19XXXA Unspecified fall, initial encounter: Secondary | ICD-10-CM

## 2017-12-19 DIAGNOSIS — Z933 Colostomy status: Secondary | ICD-10-CM | POA: Diagnosis not present

## 2017-12-19 DIAGNOSIS — Y92009 Unspecified place in unspecified non-institutional (private) residence as the place of occurrence of the external cause: Secondary | ICD-10-CM

## 2017-12-19 DIAGNOSIS — N179 Acute kidney failure, unspecified: Secondary | ICD-10-CM | POA: Diagnosis not present

## 2017-12-19 DIAGNOSIS — Z7901 Long term (current) use of anticoagulants: Secondary | ICD-10-CM | POA: Diagnosis not present

## 2017-12-19 DIAGNOSIS — N6320 Unspecified lump in the left breast, unspecified quadrant: Secondary | ICD-10-CM | POA: Diagnosis not present

## 2017-12-19 DIAGNOSIS — Z7984 Long term (current) use of oral hypoglycemic drugs: Secondary | ICD-10-CM | POA: Diagnosis not present

## 2017-12-19 DIAGNOSIS — W1830XD Fall on same level, unspecified, subsequent encounter: Secondary | ICD-10-CM | POA: Diagnosis not present

## 2017-12-19 DIAGNOSIS — I5032 Chronic diastolic (congestive) heart failure: Secondary | ICD-10-CM | POA: Diagnosis not present

## 2017-12-19 DIAGNOSIS — M25551 Pain in right hip: Secondary | ICD-10-CM | POA: Diagnosis not present

## 2017-12-19 DIAGNOSIS — E785 Hyperlipidemia, unspecified: Secondary | ICD-10-CM | POA: Diagnosis not present

## 2017-12-19 DIAGNOSIS — Z86711 Personal history of pulmonary embolism: Secondary | ICD-10-CM | POA: Diagnosis not present

## 2017-12-19 DIAGNOSIS — M1990 Unspecified osteoarthritis, unspecified site: Secondary | ICD-10-CM | POA: Diagnosis not present

## 2017-12-19 DIAGNOSIS — Z87891 Personal history of nicotine dependence: Secondary | ICD-10-CM | POA: Diagnosis not present

## 2017-12-19 DIAGNOSIS — E1142 Type 2 diabetes mellitus with diabetic polyneuropathy: Secondary | ICD-10-CM | POA: Diagnosis not present

## 2017-12-19 DIAGNOSIS — I700 Atherosclerosis of aorta: Secondary | ICD-10-CM | POA: Diagnosis not present

## 2017-12-19 DIAGNOSIS — Z96642 Presence of left artificial hip joint: Secondary | ICD-10-CM | POA: Diagnosis not present

## 2017-12-19 DIAGNOSIS — M25552 Pain in left hip: Secondary | ICD-10-CM | POA: Diagnosis not present

## 2017-12-19 NOTE — Patient Instructions (Addendum)
BEFORE YOU LEAVE: -lab appointment in 1 week to recheck CBC and BMP -follow up: with Dr. [name] in 3 months  CALL your surgeon's office today to schedule follow up in the next 1 week.  CALL your coumadin/cardiology clinic today for follow up. Follow your surgeon and your coumadin clinic recommendations for the coumadin.  Hold off on the Metformin until we recheck your kidneys last week. Check your fasting blood sugar once daily and let us know if running high - >150 this week.  Eat a healthy diet with iron rich foods included daily  Be very careful with ambulating and use walker or cane at all times  See care immediately if feeling worse, new concerns or you are not continuing to improve. I hope you feel all better soon.

## 2017-12-19 NOTE — Telephone Encounter (Signed)
Unable to reach patient at time of TCM Call.  Left message for patient to return call when available. CRM created.   

## 2017-12-19 NOTE — Telephone Encounter (Signed)
Copied from CRM [id number]. Topic: Quick Communication - Home Health Verbal Orders >> Dec 19, 2017  1:55 PM Marylen Ponto wrote: Caller/Agency: Tamala Ser / [facility] Callback Number: [phone] Requesting OT/PT/Skilled Nursing/Social Work: PT  Frequency: 2 times a week for 4 weeks  Also requests Home health aid 2 times a week for 3 weeks and 1 time a week for 1 week

## 2017-12-20 ENCOUNTER — Telehealth: Payer: Self-pay | Admitting: Family Medicine

## 2017-12-20 ENCOUNTER — Ambulatory Visit: Payer: Medicare Other | Admitting: *Deleted

## 2017-12-20 DIAGNOSIS — I2699 Other pulmonary embolism without acute cor pulmonale: Secondary | ICD-10-CM | POA: Diagnosis not present

## 2017-12-20 DIAGNOSIS — Z5181 Encounter for therapeutic drug level monitoring: Secondary | ICD-10-CM

## 2017-12-20 DIAGNOSIS — Z7901 Long term (current) use of anticoagulants: Secondary | ICD-10-CM | POA: Diagnosis not present

## 2017-12-20 LAB — POCT INR: INR: 1.2 — AB (ref 2.0–3.0)

## 2017-12-20 MED ORDER — ENOXAPARIN SODIUM 120 MG/0.8ML ~~LOC~~ SOLN: 120.0000 mg | SUBCUTANEOUS | 0 refills | Status: DC

## 2017-12-20 NOTE — Telephone Encounter (Signed)
Unable to leave a message on Hannah [name]'s number due to voicemail being full.

## 2017-12-20 NOTE — Telephone Encounter (Signed)
Copied from CRM [id number]. Topic: General - Other >> Dec 20, 2017  2:14 PM Angela Nevin wrote: Patients daughter, [name], requesting a call back from Dr. Elmyra Ricks nurse in office. [name] would not disclose reason, just states she would like to discuss 12/17 office visit. Please advise.

## 2017-12-20 NOTE — Telephone Encounter (Signed)
Previous TCM message created on 12/19/17 was closed as it was over lapped with a refill request.  Patient called again today for TCM call - Spoke with daughter. States that the patient was just seen by Dr [name] on 12/19/17 for a fall and her Hospital stay was addressed during that visit.  This was noted and the TCM call was not completed. Daughter stated that the patient has already been seen and is scheduled for a lab appt per Dr [name] on 12/26/17.   Nothing further needed.

## 2017-12-20 NOTE — Telephone Encounter (Signed)
error 

## 2017-12-20 NOTE — Telephone Encounter (Signed)
December 19, 2017  Solon Augusta, CMA  3:39 PM Note    Unable to reach patient at time of TCM Call.  Left message for patient to return call when available. CRM created.

## 2017-12-20 NOTE — Telephone Encounter (Signed)
[name] called back and I informed her of the patients vitals from the visit yesterday, when she is due for a follow up (dates and times) and other info from the AVS.

## 2017-12-21 NOTE — Telephone Encounter (Signed)
I left a detailed message on Hannah [name]'s voicemail with the verbal order below per Dr [name].

## 2017-12-21 NOTE — Telephone Encounter (Signed)
Ok. Please send orders/PT/OT eval justifying needs.

## 2017-12-22 DIAGNOSIS — Z933 Colostomy status: Secondary | ICD-10-CM | POA: Diagnosis not present

## 2017-12-22 DIAGNOSIS — I110 Hypertensive heart disease with heart failure: Secondary | ICD-10-CM | POA: Diagnosis not present

## 2017-12-22 DIAGNOSIS — E785 Hyperlipidemia, unspecified: Secondary | ICD-10-CM | POA: Diagnosis not present

## 2017-12-22 DIAGNOSIS — M25552 Pain in left hip: Secondary | ICD-10-CM | POA: Diagnosis not present

## 2017-12-22 DIAGNOSIS — M1990 Unspecified osteoarthritis, unspecified site: Secondary | ICD-10-CM | POA: Diagnosis not present

## 2017-12-22 DIAGNOSIS — Z96659 Presence of unspecified artificial knee joint: Secondary | ICD-10-CM | POA: Diagnosis not present

## 2017-12-22 DIAGNOSIS — M25551 Pain in right hip: Secondary | ICD-10-CM | POA: Diagnosis not present

## 2017-12-22 DIAGNOSIS — E1142 Type 2 diabetes mellitus with diabetic polyneuropathy: Secondary | ICD-10-CM | POA: Diagnosis not present

## 2017-12-22 DIAGNOSIS — N179 Acute kidney failure, unspecified: Secondary | ICD-10-CM | POA: Diagnosis not present

## 2017-12-22 DIAGNOSIS — I700 Atherosclerosis of aorta: Secondary | ICD-10-CM | POA: Diagnosis not present

## 2017-12-22 DIAGNOSIS — Z7901 Long term (current) use of anticoagulants: Secondary | ICD-10-CM | POA: Diagnosis not present

## 2017-12-22 DIAGNOSIS — W1830XD Fall on same level, unspecified, subsequent encounter: Secondary | ICD-10-CM | POA: Diagnosis not present

## 2017-12-22 DIAGNOSIS — Z86711 Personal history of pulmonary embolism: Secondary | ICD-10-CM | POA: Diagnosis not present

## 2017-12-22 DIAGNOSIS — Z87891 Personal history of nicotine dependence: Secondary | ICD-10-CM | POA: Diagnosis not present

## 2017-12-22 DIAGNOSIS — I5032 Chronic diastolic (congestive) heart failure: Secondary | ICD-10-CM | POA: Diagnosis not present

## 2017-12-22 DIAGNOSIS — Z7984 Long term (current) use of oral hypoglycemic drugs: Secondary | ICD-10-CM | POA: Diagnosis not present

## 2017-12-22 DIAGNOSIS — Z96642 Presence of left artificial hip joint: Secondary | ICD-10-CM | POA: Diagnosis not present

## 2017-12-25 ENCOUNTER — Ambulatory Visit: Payer: Medicare Other | Admitting: Pharmacist

## 2017-12-25 DIAGNOSIS — Z96642 Presence of left artificial hip joint: Secondary | ICD-10-CM

## 2017-12-25 DIAGNOSIS — Z7901 Long term (current) use of anticoagulants: Secondary | ICD-10-CM

## 2017-12-25 DIAGNOSIS — M25552 Pain in left hip: Secondary | ICD-10-CM | POA: Diagnosis not present

## 2017-12-25 DIAGNOSIS — I110 Hypertensive heart disease with heart failure: Secondary | ICD-10-CM

## 2017-12-25 DIAGNOSIS — Z5181 Encounter for therapeutic drug level monitoring: Secondary | ICD-10-CM

## 2017-12-25 DIAGNOSIS — E1142 Type 2 diabetes mellitus with diabetic polyneuropathy: Secondary | ICD-10-CM

## 2017-12-25 DIAGNOSIS — Z86711 Personal history of pulmonary embolism: Secondary | ICD-10-CM

## 2017-12-25 DIAGNOSIS — I700 Atherosclerosis of aorta: Secondary | ICD-10-CM

## 2017-12-25 DIAGNOSIS — Z96659 Presence of unspecified artificial knee joint: Secondary | ICD-10-CM

## 2017-12-25 DIAGNOSIS — W1830XD Fall on same level, unspecified, subsequent encounter: Secondary | ICD-10-CM | POA: Diagnosis not present

## 2017-12-25 DIAGNOSIS — I2699 Other pulmonary embolism without acute cor pulmonale: Secondary | ICD-10-CM

## 2017-12-25 DIAGNOSIS — M1990 Unspecified osteoarthritis, unspecified site: Secondary | ICD-10-CM | POA: Diagnosis not present

## 2017-12-25 DIAGNOSIS — E785 Hyperlipidemia, unspecified: Secondary | ICD-10-CM

## 2017-12-25 DIAGNOSIS — S2002XS Contusion of left breast, sequela: Secondary | ICD-10-CM | POA: Diagnosis not present

## 2017-12-25 DIAGNOSIS — Z87891 Personal history of nicotine dependence: Secondary | ICD-10-CM

## 2017-12-25 DIAGNOSIS — M25551 Pain in right hip: Secondary | ICD-10-CM | POA: Diagnosis not present

## 2017-12-25 DIAGNOSIS — I5032 Chronic diastolic (congestive) heart failure: Secondary | ICD-10-CM

## 2017-12-25 DIAGNOSIS — Z933 Colostomy status: Secondary | ICD-10-CM

## 2017-12-25 DIAGNOSIS — N179 Acute kidney failure, unspecified: Secondary | ICD-10-CM | POA: Diagnosis not present

## 2017-12-25 DIAGNOSIS — S2002XA Contusion of left breast, initial encounter: Secondary | ICD-10-CM | POA: Diagnosis not present

## 2017-12-25 DIAGNOSIS — Z7984 Long term (current) use of oral hypoglycemic drugs: Secondary | ICD-10-CM

## 2017-12-25 LAB — POCT INR: INR: 2.7 (ref 2.0–3.0)

## 2017-12-25 NOTE — Patient Instructions (Signed)
Description   STOP lovenonx injection and continue on same dosage 1/2 tablet daily except 1 tablet on Sundays, Tuesdays and Thursdays. Recheck on Monday. Call with any new meds or procedures [phone]

## 2017-12-26 ENCOUNTER — Other Ambulatory Visit (INDEPENDENT_AMBULATORY_CARE_PROVIDER_SITE_OTHER): Payer: Medicare Other

## 2017-12-26 DIAGNOSIS — D649 Anemia, unspecified: Secondary | ICD-10-CM

## 2017-12-26 LAB — BASIC METABOLIC PANEL
BUN: 12 mg/dL (ref 6–23)
CO2: 24 mEq/L (ref 19–32)
Calcium: 8.9 mg/dL (ref 8.4–10.5)
Chloride: 106 mEq/L (ref 96–112)
Creatinine, Ser: 0.98 mg/dL (ref 0.40–1.20)
GFR: 70.84 mL/min (ref >60.00)
GLUCOSE: 100 mg/dL — AB (ref 70–99)
Potassium: 4 mEq/L (ref 3.5–5.1)
Sodium: 140 mEq/L (ref 135–145)

## 2017-12-26 LAB — CBC
HCT: 33.2 % — ABNORMAL LOW (ref 36.0–46.0)
Hemoglobin: 10.7 g/dL — ABNORMAL LOW (ref 12.0–15.0)
MCHC: 32.2 g/dL (ref 30.0–36.0)
MCV: 90.6 fl (ref 78.0–100.0)
Platelets: 341 10*3/uL (ref 150.0–400.0)
RBC: 3.67 Mil/uL — AB (ref 3.87–5.11)
RDW: 16.8 % — ABNORMAL HIGH (ref 11.5–15.5)
WBC: 8 10*3/uL (ref 4.0–10.5)

## 2017-12-28 DIAGNOSIS — I700 Atherosclerosis of aorta: Secondary | ICD-10-CM | POA: Diagnosis not present

## 2017-12-28 DIAGNOSIS — I110 Hypertensive heart disease with heart failure: Secondary | ICD-10-CM | POA: Diagnosis not present

## 2017-12-28 DIAGNOSIS — Z7901 Long term (current) use of anticoagulants: Secondary | ICD-10-CM | POA: Diagnosis not present

## 2017-12-28 DIAGNOSIS — Z87891 Personal history of nicotine dependence: Secondary | ICD-10-CM | POA: Diagnosis not present

## 2017-12-28 DIAGNOSIS — Z7984 Long term (current) use of oral hypoglycemic drugs: Secondary | ICD-10-CM | POA: Diagnosis not present

## 2017-12-28 DIAGNOSIS — M25551 Pain in right hip: Secondary | ICD-10-CM | POA: Diagnosis not present

## 2017-12-28 DIAGNOSIS — N179 Acute kidney failure, unspecified: Secondary | ICD-10-CM | POA: Diagnosis not present

## 2017-12-28 DIAGNOSIS — Z96659 Presence of unspecified artificial knee joint: Secondary | ICD-10-CM | POA: Diagnosis not present

## 2017-12-28 DIAGNOSIS — Z86711 Personal history of pulmonary embolism: Secondary | ICD-10-CM | POA: Diagnosis not present

## 2017-12-28 DIAGNOSIS — E785 Hyperlipidemia, unspecified: Secondary | ICD-10-CM | POA: Diagnosis not present

## 2017-12-28 DIAGNOSIS — M25552 Pain in left hip: Secondary | ICD-10-CM | POA: Diagnosis not present

## 2017-12-28 DIAGNOSIS — E1142 Type 2 diabetes mellitus with diabetic polyneuropathy: Secondary | ICD-10-CM | POA: Diagnosis not present

## 2017-12-28 DIAGNOSIS — I5032 Chronic diastolic (congestive) heart failure: Secondary | ICD-10-CM | POA: Diagnosis not present

## 2017-12-28 DIAGNOSIS — Z96642 Presence of left artificial hip joint: Secondary | ICD-10-CM | POA: Diagnosis not present

## 2017-12-28 DIAGNOSIS — Z933 Colostomy status: Secondary | ICD-10-CM | POA: Diagnosis not present

## 2017-12-28 DIAGNOSIS — W1830XD Fall on same level, unspecified, subsequent encounter: Secondary | ICD-10-CM | POA: Diagnosis not present

## 2017-12-28 DIAGNOSIS — M1990 Unspecified osteoarthritis, unspecified site: Secondary | ICD-10-CM | POA: Diagnosis not present

## 2018-01-01 ENCOUNTER — Ambulatory Visit: Payer: Medicare Other | Admitting: *Deleted

## 2018-01-01 DIAGNOSIS — Z96659 Presence of unspecified artificial knee joint: Secondary | ICD-10-CM | POA: Diagnosis not present

## 2018-01-01 DIAGNOSIS — E1142 Type 2 diabetes mellitus with diabetic polyneuropathy: Secondary | ICD-10-CM | POA: Diagnosis not present

## 2018-01-01 DIAGNOSIS — Z933 Colostomy status: Secondary | ICD-10-CM | POA: Diagnosis not present

## 2018-01-01 DIAGNOSIS — Z86711 Personal history of pulmonary embolism: Secondary | ICD-10-CM | POA: Diagnosis not present

## 2018-01-01 DIAGNOSIS — Z7901 Long term (current) use of anticoagulants: Secondary | ICD-10-CM

## 2018-01-01 DIAGNOSIS — Z5181 Encounter for therapeutic drug level monitoring: Secondary | ICD-10-CM

## 2018-01-01 DIAGNOSIS — I2699 Other pulmonary embolism without acute cor pulmonale: Secondary | ICD-10-CM | POA: Diagnosis not present

## 2018-01-01 DIAGNOSIS — M25552 Pain in left hip: Secondary | ICD-10-CM | POA: Diagnosis not present

## 2018-01-01 DIAGNOSIS — I5032 Chronic diastolic (congestive) heart failure: Secondary | ICD-10-CM | POA: Diagnosis not present

## 2018-01-01 DIAGNOSIS — Z87891 Personal history of nicotine dependence: Secondary | ICD-10-CM | POA: Diagnosis not present

## 2018-01-01 DIAGNOSIS — M1990 Unspecified osteoarthritis, unspecified site: Secondary | ICD-10-CM | POA: Diagnosis not present

## 2018-01-01 DIAGNOSIS — M25551 Pain in right hip: Secondary | ICD-10-CM | POA: Diagnosis not present

## 2018-01-01 DIAGNOSIS — Z7984 Long term (current) use of oral hypoglycemic drugs: Secondary | ICD-10-CM | POA: Diagnosis not present

## 2018-01-01 DIAGNOSIS — N179 Acute kidney failure, unspecified: Secondary | ICD-10-CM | POA: Diagnosis not present

## 2018-01-01 DIAGNOSIS — I700 Atherosclerosis of aorta: Secondary | ICD-10-CM | POA: Diagnosis not present

## 2018-01-01 DIAGNOSIS — I110 Hypertensive heart disease with heart failure: Secondary | ICD-10-CM | POA: Diagnosis not present

## 2018-01-01 DIAGNOSIS — E785 Hyperlipidemia, unspecified: Secondary | ICD-10-CM | POA: Diagnosis not present

## 2018-01-01 DIAGNOSIS — Z96642 Presence of left artificial hip joint: Secondary | ICD-10-CM | POA: Diagnosis not present

## 2018-01-01 DIAGNOSIS — W1830XD Fall on same level, unspecified, subsequent encounter: Secondary | ICD-10-CM | POA: Diagnosis not present

## 2018-01-01 LAB — POCT INR: INR: 3.5 — AB (ref 2.0–3.0)

## 2018-01-01 NOTE — Patient Instructions (Signed)
Description   Continue on same dosage 1/2 tablet daily except 1 tablet on Sundays, Tuesdays and Thursdays. Recheck on 10 days. Please resume normal dark green leafy intake weekly.  Call with any new meds or procedures [phone]

## 2018-01-02 DIAGNOSIS — Z87891 Personal history of nicotine dependence: Secondary | ICD-10-CM | POA: Diagnosis not present

## 2018-01-02 DIAGNOSIS — E1142 Type 2 diabetes mellitus with diabetic polyneuropathy: Secondary | ICD-10-CM | POA: Diagnosis not present

## 2018-01-02 DIAGNOSIS — Z96659 Presence of unspecified artificial knee joint: Secondary | ICD-10-CM | POA: Diagnosis not present

## 2018-01-02 DIAGNOSIS — Z7901 Long term (current) use of anticoagulants: Secondary | ICD-10-CM | POA: Diagnosis not present

## 2018-01-02 DIAGNOSIS — E785 Hyperlipidemia, unspecified: Secondary | ICD-10-CM | POA: Diagnosis not present

## 2018-01-02 DIAGNOSIS — I110 Hypertensive heart disease with heart failure: Secondary | ICD-10-CM | POA: Diagnosis not present

## 2018-01-02 DIAGNOSIS — M25551 Pain in right hip: Secondary | ICD-10-CM | POA: Diagnosis not present

## 2018-01-02 DIAGNOSIS — Z7984 Long term (current) use of oral hypoglycemic drugs: Secondary | ICD-10-CM | POA: Diagnosis not present

## 2018-01-02 DIAGNOSIS — I5032 Chronic diastolic (congestive) heart failure: Secondary | ICD-10-CM | POA: Diagnosis not present

## 2018-01-02 DIAGNOSIS — Z86711 Personal history of pulmonary embolism: Secondary | ICD-10-CM | POA: Diagnosis not present

## 2018-01-02 DIAGNOSIS — N179 Acute kidney failure, unspecified: Secondary | ICD-10-CM | POA: Diagnosis not present

## 2018-01-02 DIAGNOSIS — Z96642 Presence of left artificial hip joint: Secondary | ICD-10-CM | POA: Diagnosis not present

## 2018-01-02 DIAGNOSIS — M1990 Unspecified osteoarthritis, unspecified site: Secondary | ICD-10-CM | POA: Diagnosis not present

## 2018-01-02 DIAGNOSIS — I700 Atherosclerosis of aorta: Secondary | ICD-10-CM | POA: Diagnosis not present

## 2018-01-02 DIAGNOSIS — Z933 Colostomy status: Secondary | ICD-10-CM | POA: Diagnosis not present

## 2018-01-02 DIAGNOSIS — M25552 Pain in left hip: Secondary | ICD-10-CM | POA: Diagnosis not present

## 2018-01-02 DIAGNOSIS — W1830XD Fall on same level, unspecified, subsequent encounter: Secondary | ICD-10-CM | POA: Diagnosis not present

## 2018-01-05 ENCOUNTER — Telehealth: Payer: Self-pay | Admitting: Family Medicine

## 2018-01-05 DIAGNOSIS — M1990 Unspecified osteoarthritis, unspecified site: Secondary | ICD-10-CM | POA: Diagnosis not present

## 2018-01-05 DIAGNOSIS — Z86711 Personal history of pulmonary embolism: Secondary | ICD-10-CM | POA: Diagnosis not present

## 2018-01-05 DIAGNOSIS — M25551 Pain in right hip: Secondary | ICD-10-CM | POA: Diagnosis not present

## 2018-01-05 DIAGNOSIS — Z933 Colostomy status: Secondary | ICD-10-CM | POA: Diagnosis not present

## 2018-01-05 DIAGNOSIS — N179 Acute kidney failure, unspecified: Secondary | ICD-10-CM | POA: Diagnosis not present

## 2018-01-05 DIAGNOSIS — Z7984 Long term (current) use of oral hypoglycemic drugs: Secondary | ICD-10-CM | POA: Diagnosis not present

## 2018-01-05 DIAGNOSIS — I700 Atherosclerosis of aorta: Secondary | ICD-10-CM | POA: Diagnosis not present

## 2018-01-05 DIAGNOSIS — E1142 Type 2 diabetes mellitus with diabetic polyneuropathy: Secondary | ICD-10-CM | POA: Diagnosis not present

## 2018-01-05 DIAGNOSIS — W1830XD Fall on same level, unspecified, subsequent encounter: Secondary | ICD-10-CM | POA: Diagnosis not present

## 2018-01-05 DIAGNOSIS — E785 Hyperlipidemia, unspecified: Secondary | ICD-10-CM | POA: Diagnosis not present

## 2018-01-05 DIAGNOSIS — M25552 Pain in left hip: Secondary | ICD-10-CM | POA: Diagnosis not present

## 2018-01-05 DIAGNOSIS — Z96659 Presence of unspecified artificial knee joint: Secondary | ICD-10-CM | POA: Diagnosis not present

## 2018-01-05 DIAGNOSIS — I5032 Chronic diastolic (congestive) heart failure: Secondary | ICD-10-CM | POA: Diagnosis not present

## 2018-01-05 DIAGNOSIS — I110 Hypertensive heart disease with heart failure: Secondary | ICD-10-CM | POA: Diagnosis not present

## 2018-01-05 DIAGNOSIS — Z7901 Long term (current) use of anticoagulants: Secondary | ICD-10-CM | POA: Diagnosis not present

## 2018-01-05 DIAGNOSIS — Z96642 Presence of left artificial hip joint: Secondary | ICD-10-CM | POA: Diagnosis not present

## 2018-01-05 DIAGNOSIS — Z87891 Personal history of nicotine dependence: Secondary | ICD-10-CM | POA: Diagnosis not present

## 2018-01-05 NOTE — Telephone Encounter (Signed)
Copied from CRM [id number]. Topic: Quick Communication - Home Health Verbal Orders >> Jan 05, 2018  1:07 PM Lorrine Kin, [place] wrote: Caller/AgencyLinford Arnold with [facility] Callback Number: [phone] Requesting OT/PT/Skilled Nursing/Social Work: Occupational therapy  Frequency:  Occupational therapy evaluation

## 2018-01-08 DIAGNOSIS — M25551 Pain in right hip: Secondary | ICD-10-CM | POA: Diagnosis not present

## 2018-01-08 DIAGNOSIS — I5032 Chronic diastolic (congestive) heart failure: Secondary | ICD-10-CM | POA: Diagnosis not present

## 2018-01-08 DIAGNOSIS — I110 Hypertensive heart disease with heart failure: Secondary | ICD-10-CM | POA: Diagnosis not present

## 2018-01-08 DIAGNOSIS — Z96642 Presence of left artificial hip joint: Secondary | ICD-10-CM | POA: Diagnosis not present

## 2018-01-08 DIAGNOSIS — Z96659 Presence of unspecified artificial knee joint: Secondary | ICD-10-CM | POA: Diagnosis not present

## 2018-01-08 DIAGNOSIS — W1830XD Fall on same level, unspecified, subsequent encounter: Secondary | ICD-10-CM | POA: Diagnosis not present

## 2018-01-08 DIAGNOSIS — M1990 Unspecified osteoarthritis, unspecified site: Secondary | ICD-10-CM | POA: Diagnosis not present

## 2018-01-08 DIAGNOSIS — E1142 Type 2 diabetes mellitus with diabetic polyneuropathy: Secondary | ICD-10-CM | POA: Diagnosis not present

## 2018-01-08 DIAGNOSIS — Z7901 Long term (current) use of anticoagulants: Secondary | ICD-10-CM | POA: Diagnosis not present

## 2018-01-08 DIAGNOSIS — Z86711 Personal history of pulmonary embolism: Secondary | ICD-10-CM | POA: Diagnosis not present

## 2018-01-08 DIAGNOSIS — S2002XS Contusion of left breast, sequela: Secondary | ICD-10-CM | POA: Diagnosis not present

## 2018-01-08 DIAGNOSIS — Z87891 Personal history of nicotine dependence: Secondary | ICD-10-CM | POA: Diagnosis not present

## 2018-01-08 DIAGNOSIS — Z7984 Long term (current) use of oral hypoglycemic drugs: Secondary | ICD-10-CM | POA: Diagnosis not present

## 2018-01-08 DIAGNOSIS — N179 Acute kidney failure, unspecified: Secondary | ICD-10-CM | POA: Diagnosis not present

## 2018-01-08 DIAGNOSIS — M25552 Pain in left hip: Secondary | ICD-10-CM | POA: Diagnosis not present

## 2018-01-08 DIAGNOSIS — E785 Hyperlipidemia, unspecified: Secondary | ICD-10-CM | POA: Diagnosis not present

## 2018-01-08 DIAGNOSIS — I700 Atherosclerosis of aorta: Secondary | ICD-10-CM | POA: Diagnosis not present

## 2018-01-08 DIAGNOSIS — Z933 Colostomy status: Secondary | ICD-10-CM | POA: Diagnosis not present

## 2018-01-08 NOTE — Telephone Encounter (Signed)
Thought we already okd this?

## 2018-01-09 NOTE — Telephone Encounter (Signed)
Left detailed message on machine for [facility] with [facility] with verbal orders.

## 2018-01-10 ENCOUNTER — Ambulatory Visit: Payer: Medicare Other | Admitting: *Deleted

## 2018-01-10 DIAGNOSIS — Z5181 Encounter for therapeutic drug level monitoring: Secondary | ICD-10-CM | POA: Diagnosis not present

## 2018-01-10 DIAGNOSIS — I2699 Other pulmonary embolism without acute cor pulmonale: Secondary | ICD-10-CM | POA: Diagnosis not present

## 2018-01-10 DIAGNOSIS — Z7901 Long term (current) use of anticoagulants: Secondary | ICD-10-CM

## 2018-01-10 LAB — POCT INR: INR: 2.9 (ref 2.0–3.0)

## 2018-01-10 NOTE — Patient Instructions (Signed)
Description   Continue on same dosage 1/2 tablet daily except 1 tablet on Sundays, Tuesdays and Thursdays. Recheck in 2 weeks. Please resume normal dark green leafy intake weekly.  Call with any new meds or procedures [phone]

## 2018-01-11 DIAGNOSIS — M25551 Pain in right hip: Secondary | ICD-10-CM | POA: Diagnosis not present

## 2018-01-11 DIAGNOSIS — Z7984 Long term (current) use of oral hypoglycemic drugs: Secondary | ICD-10-CM | POA: Diagnosis not present

## 2018-01-11 DIAGNOSIS — N179 Acute kidney failure, unspecified: Secondary | ICD-10-CM | POA: Diagnosis not present

## 2018-01-11 DIAGNOSIS — E785 Hyperlipidemia, unspecified: Secondary | ICD-10-CM | POA: Diagnosis not present

## 2018-01-11 DIAGNOSIS — Z7901 Long term (current) use of anticoagulants: Secondary | ICD-10-CM | POA: Diagnosis not present

## 2018-01-11 DIAGNOSIS — Z96659 Presence of unspecified artificial knee joint: Secondary | ICD-10-CM | POA: Diagnosis not present

## 2018-01-11 DIAGNOSIS — E1142 Type 2 diabetes mellitus with diabetic polyneuropathy: Secondary | ICD-10-CM | POA: Diagnosis not present

## 2018-01-11 DIAGNOSIS — I700 Atherosclerosis of aorta: Secondary | ICD-10-CM | POA: Diagnosis not present

## 2018-01-11 DIAGNOSIS — I110 Hypertensive heart disease with heart failure: Secondary | ICD-10-CM | POA: Diagnosis not present

## 2018-01-11 DIAGNOSIS — Z87891 Personal history of nicotine dependence: Secondary | ICD-10-CM | POA: Diagnosis not present

## 2018-01-11 DIAGNOSIS — Z96642 Presence of left artificial hip joint: Secondary | ICD-10-CM | POA: Diagnosis not present

## 2018-01-11 DIAGNOSIS — M25552 Pain in left hip: Secondary | ICD-10-CM | POA: Diagnosis not present

## 2018-01-11 DIAGNOSIS — I5032 Chronic diastolic (congestive) heart failure: Secondary | ICD-10-CM | POA: Diagnosis not present

## 2018-01-11 DIAGNOSIS — Z933 Colostomy status: Secondary | ICD-10-CM | POA: Diagnosis not present

## 2018-01-11 DIAGNOSIS — Z86711 Personal history of pulmonary embolism: Secondary | ICD-10-CM | POA: Diagnosis not present

## 2018-01-11 DIAGNOSIS — M1990 Unspecified osteoarthritis, unspecified site: Secondary | ICD-10-CM | POA: Diagnosis not present

## 2018-01-11 DIAGNOSIS — W1830XD Fall on same level, unspecified, subsequent encounter: Secondary | ICD-10-CM | POA: Diagnosis not present

## 2018-01-12 DIAGNOSIS — I5032 Chronic diastolic (congestive) heart failure: Secondary | ICD-10-CM | POA: Diagnosis not present

## 2018-01-12 DIAGNOSIS — I700 Atherosclerosis of aorta: Secondary | ICD-10-CM | POA: Diagnosis not present

## 2018-01-12 DIAGNOSIS — Z7901 Long term (current) use of anticoagulants: Secondary | ICD-10-CM | POA: Diagnosis not present

## 2018-01-12 DIAGNOSIS — Z87891 Personal history of nicotine dependence: Secondary | ICD-10-CM | POA: Diagnosis not present

## 2018-01-12 DIAGNOSIS — M25551 Pain in right hip: Secondary | ICD-10-CM | POA: Diagnosis not present

## 2018-01-12 DIAGNOSIS — N179 Acute kidney failure, unspecified: Secondary | ICD-10-CM | POA: Diagnosis not present

## 2018-01-12 DIAGNOSIS — Z96659 Presence of unspecified artificial knee joint: Secondary | ICD-10-CM | POA: Diagnosis not present

## 2018-01-12 DIAGNOSIS — M1990 Unspecified osteoarthritis, unspecified site: Secondary | ICD-10-CM | POA: Diagnosis not present

## 2018-01-12 DIAGNOSIS — Z933 Colostomy status: Secondary | ICD-10-CM | POA: Diagnosis not present

## 2018-01-12 DIAGNOSIS — E785 Hyperlipidemia, unspecified: Secondary | ICD-10-CM | POA: Diagnosis not present

## 2018-01-12 DIAGNOSIS — E1142 Type 2 diabetes mellitus with diabetic polyneuropathy: Secondary | ICD-10-CM | POA: Diagnosis not present

## 2018-01-12 DIAGNOSIS — Z86711 Personal history of pulmonary embolism: Secondary | ICD-10-CM | POA: Diagnosis not present

## 2018-01-12 DIAGNOSIS — M25552 Pain in left hip: Secondary | ICD-10-CM | POA: Diagnosis not present

## 2018-01-12 DIAGNOSIS — I110 Hypertensive heart disease with heart failure: Secondary | ICD-10-CM | POA: Diagnosis not present

## 2018-01-12 DIAGNOSIS — Z96642 Presence of left artificial hip joint: Secondary | ICD-10-CM | POA: Diagnosis not present

## 2018-01-12 DIAGNOSIS — Z7984 Long term (current) use of oral hypoglycemic drugs: Secondary | ICD-10-CM | POA: Diagnosis not present

## 2018-01-12 DIAGNOSIS — W1830XD Fall on same level, unspecified, subsequent encounter: Secondary | ICD-10-CM | POA: Diagnosis not present

## 2018-01-25 ENCOUNTER — Ambulatory Visit: Payer: Medicare Other | Admitting: *Deleted

## 2018-01-25 DIAGNOSIS — I2699 Other pulmonary embolism without acute cor pulmonale: Secondary | ICD-10-CM

## 2018-01-25 DIAGNOSIS — Z5181 Encounter for therapeutic drug level monitoring: Secondary | ICD-10-CM | POA: Diagnosis not present

## 2018-01-25 DIAGNOSIS — Z7901 Long term (current) use of anticoagulants: Secondary | ICD-10-CM | POA: Diagnosis not present

## 2018-01-25 LAB — POCT INR: INR: 3.3 — AB (ref 2.0–3.0)

## 2018-01-25 NOTE — Patient Instructions (Signed)
Description   Continue on same dosage 1/2 tablet daily except 1 tablet on Sundays, Tuesdays and Thursdays. Recheck in 3 weeks. Please resume normal dark green leafy intake weekly.  Call with any new meds or procedures [phone]

## 2018-02-12 DIAGNOSIS — S2002XS Contusion of left breast, sequela: Secondary | ICD-10-CM | POA: Diagnosis not present

## 2018-02-15 ENCOUNTER — Ambulatory Visit: Payer: Medicare Other

## 2018-02-15 DIAGNOSIS — Z7901 Long term (current) use of anticoagulants: Secondary | ICD-10-CM

## 2018-02-15 DIAGNOSIS — I2699 Other pulmonary embolism without acute cor pulmonale: Secondary | ICD-10-CM | POA: Diagnosis not present

## 2018-02-15 DIAGNOSIS — Z5181 Encounter for therapeutic drug level monitoring: Secondary | ICD-10-CM | POA: Diagnosis not present

## 2018-02-15 LAB — POCT INR: INR: 4 — AB (ref 2.0–3.0)

## 2018-02-15 NOTE — Patient Instructions (Signed)
Description   Skip today's dosage of Coumadin, then resume same dosage 1/2 tablet daily except 1 tablet on Sundays, Tuesdays and Thursdays. Recheck in 3 weeks. Please resume normal dark green leafy intake weekly.  Call with any new meds or procedures [phone]

## 2018-02-23 ENCOUNTER — Telehealth: Payer: Self-pay | Admitting: Family Medicine

## 2018-02-23 NOTE — Telephone Encounter (Signed)
Marrian Salvage dropped off FMLA forms   Call Marrian Salvage to pick up forms at: [phone]  Disposition: Dr's Folder

## 2018-02-26 DIAGNOSIS — Z0279 Encounter for issue of other medical certificate: Secondary | ICD-10-CM

## 2018-02-26 NOTE — Telephone Encounter (Signed)
I called the pts daughter Hannah [name] and informed her Dr [name] noted a form attached which stated she needed to be out for approx 6-8 weeks as of 03/12/18.  Dr [name] stated she was not aware of a surgery coming up on this date and if so, the forms would need to be completed by a specialist. She stated the pt does not have a surgery coming up.  I advised Hannah [name] the forms were completed (same as lastly done for her sister [name] in 03/2017) and left at the front desk.  She stated she is now going to be the primary person bringing Hannah [name] to the appointments now and Dr [name] was informed of this.

## 2018-02-26 NOTE — Telephone Encounter (Signed)
Back to you per our conversation

## 2018-03-07 ENCOUNTER — Telehealth: Payer: Self-pay | Admitting: *Deleted

## 2018-03-07 NOTE — Telephone Encounter (Signed)
Copied from CRM [id number]. Topic: General - Other >> Mar 07, 2018  9:03 AM Herby Abraham C wrote: Reason for CRM: pt's daughter [name] called in requesting a call back from [place] PCP's CMA.    CB: [phone]

## 2018-03-07 NOTE — Telephone Encounter (Signed)
Patient called to add to her previous message and let the doctor know that the note can be written on office stationary.  This will be acceptable.  Please advise.

## 2018-03-07 NOTE — Telephone Encounter (Signed)
I called Hannah [name] and she wanted to know if Dr [name] would change the wording of intermittent to ongoing on the FMLA papers for the next 6 weeks due to her needing time off to spend with her mother to try to get her back to normal or more active since being in the hospital in December.  She stated she has told her employer that her mother is important to her, she is concerned about her well being and if this is not approved she may have to quit her job.  Message snet to Dr [name] and the pt is aware Dr [name] is out of the office and this will be addressed on Thursday.

## 2018-03-08 ENCOUNTER — Ambulatory Visit: Payer: Medicare Other | Admitting: Pharmacist

## 2018-03-08 DIAGNOSIS — Z7901 Long term (current) use of anticoagulants: Secondary | ICD-10-CM

## 2018-03-08 DIAGNOSIS — Z5181 Encounter for therapeutic drug level monitoring: Secondary | ICD-10-CM

## 2018-03-08 DIAGNOSIS — I2699 Other pulmonary embolism without acute cor pulmonale: Secondary | ICD-10-CM

## 2018-03-08 LAB — POCT INR: INR: 3 (ref 2.0–3.0)

## 2018-03-08 NOTE — Telephone Encounter (Signed)
Unable to leave a message due to voicemail being full.  CRM also created. 

## 2018-03-08 NOTE — Telephone Encounter (Signed)
Please let her know that she has to have specific medical needs that require all day care/treatment, hospitalizations, etc for it to qualify for under FMLA. I am not aware of any dx that I treat her for that requires that. However, if a specialist has advised that perhaps they could assist?

## 2018-03-08 NOTE — Patient Instructions (Addendum)
  Description   Continue same dosage 1/2 tablet daily except 1 tablet on Sundays, Tuesdays and Thursdays. Recheck in 3.5 weeks. Please resume normal dark green leafy intake weekly.  Call with any new meds or procedures [phone]

## 2018-03-08 NOTE — Telephone Encounter (Signed)
Hannah [name] is aware of message below.

## 2018-03-09 NOTE — Telephone Encounter (Signed)
Noted  

## 2018-03-20 ENCOUNTER — Ambulatory Visit: Payer: Medicare Other | Admitting: Family Medicine

## 2018-03-27 ENCOUNTER — Telehealth: Payer: Self-pay | Admitting: *Deleted

## 2018-03-27 NOTE — Telephone Encounter (Signed)
Copied from CRM [id number]. Topic: General - Other >> Mar 27, 2018  8:39 AM Hannah [name] wrote: Reason for CRM: Pt's daughter [name] called back to let Randa Evens know pt would prefer a telephone appointment. Please advise.

## 2018-03-27 NOTE — Telephone Encounter (Signed)
Note added to appointment notes for 3/26.

## 2018-03-28 ENCOUNTER — Telehealth: Payer: Self-pay | Admitting: Internal Medicine

## 2018-03-28 NOTE — Progress Notes (Signed)
Virtual Visit via Telephone Note  I connected with Hannah [name] on 03/28/18 at 10:00 AM EDT by telephone and verified that I am speaking with the correct person using two identifiers.   I discussed the limitations, risks, security and privacy concerns of performing an evaluation and management service by telephone and the availability of in person appointments. I also discussed with the patient that there may be a patient responsible charge related to this service. The patient expressed understanding and agreed to proceed.  Location patient: home Location provider: work or home office Participants present for the call: patient, provider Patient did not have a visit in the prior 7 days to address this/these issue(s).   History of Present Illness:  Med check/Follow up:  Diabetes: -with neurological complications -meds:metformin, on statin -BMP ok the endo of December -due for Hgba1c, urine micro/alb cr -Home BS around 100-108, FBS 108 today  -no low blood sugar  Hypothyroidism: -meds: levothyroxine daily  Sees cardiology for management HTN, HLD, Hx PE, pulm HTN, CdCHF on Coumadin.  - meds: lasix 20mg  prn, metoprolol 25mg  bid, simvastatin 20mg , Coumadin per cardiology -has not had swelling or SOB or wt gain - has not needed lasix in a long time -did INR with cardiology 3/5 3.0  -taking  -no CP, SOB, swelling or weight gain  Observations/Objective: Patient sounds cheerful and well on the phone. I do not appreciate any SOB. Speech and thought processing are grossly intact. Patient reported vitals:  Assessment and Plan:  Other specified hypothyroidism  Essential hypertension  Controlled type 2 diabetes mellitus with diabetic neuropathy, without long-term current use of insulin (HCC)  Chronic diastolic CHF (congestive heart failure) (HCC)  Anticoagulated on warfarin  History of pulmonary embolism  -reviewed health and medications -checking Sugar and wt at home  and well controlled, she prefers to avoid non-critical OV/Labs for the time being in light of COVID19 spread in the community. -continue home monitoring of diabetes, she will call if running high or low -cont to monitor weight, swelling -cont care with cardiology for coumading/CV disease -TOC visit with Dr. Hassan Rowan in 2-3 months (she prefers phone call if virtual as does not have video capability)  I did not refer this patient for an OV in the next 24 hours for this/these issue(s).  I discussed the assessment and treatment plan with the patient. The patient was provided an opportunity to ask questions and all were answered. The patient agreed with the plan and demonstrated an understanding of the instructions.    Follow Up Instructions: -advised assistant Ronnald Collum to arrange the following: -TOC visit via phone with Dr. Hassan Rowan in 2-3 months, labs then if drive up lab availalble, pt declined today -add coumadin to med list (rxd by cardiology) - pt reports currently 0.5-1 tablet of 5mg  per coumadin clinic instructions  I provided 13 minutes of non-face-to-face time during this encounter.   Hannah Koyanagi, DO

## 2018-03-28 NOTE — Telephone Encounter (Signed)
Patient called Spoke with daughter Has evisit with primary MD next week WIll continue with coumadin Resched appt with me ths summer (July) either evisit or live Please make sure refills OK

## 2018-03-29 ENCOUNTER — Ambulatory Visit (INDEPENDENT_AMBULATORY_CARE_PROVIDER_SITE_OTHER): Payer: Medicare Other | Admitting: Family Medicine

## 2018-03-29 ENCOUNTER — Telehealth: Payer: Self-pay

## 2018-03-29 ENCOUNTER — Other Ambulatory Visit: Payer: Self-pay

## 2018-03-29 DIAGNOSIS — I10 Essential (primary) hypertension: Secondary | ICD-10-CM

## 2018-03-29 DIAGNOSIS — E038 Other specified hypothyroidism: Secondary | ICD-10-CM

## 2018-03-29 DIAGNOSIS — I5032 Chronic diastolic (congestive) heart failure: Secondary | ICD-10-CM

## 2018-03-29 DIAGNOSIS — E1140 Type 2 diabetes mellitus with diabetic neuropathy, unspecified: Secondary | ICD-10-CM

## 2018-03-29 DIAGNOSIS — Z7901 Long term (current) use of anticoagulants: Secondary | ICD-10-CM | POA: Diagnosis not present

## 2018-03-29 DIAGNOSIS — Z86711 Personal history of pulmonary embolism: Secondary | ICD-10-CM

## 2018-03-29 NOTE — Telephone Encounter (Signed)

## 2018-03-29 NOTE — Telephone Encounter (Signed)
Cancelled upcoming appointment, will send to C19 r/s pool to plan follow up 

## 2018-03-30 ENCOUNTER — Ambulatory Visit: Payer: Self-pay | Admitting: *Deleted

## 2018-03-30 ENCOUNTER — Encounter: Payer: Self-pay | Admitting: Family Medicine

## 2018-03-30 ENCOUNTER — Ambulatory Visit (INDEPENDENT_AMBULATORY_CARE_PROVIDER_SITE_OTHER): Payer: Medicare Other | Admitting: Family Medicine

## 2018-03-30 ENCOUNTER — Ambulatory Visit (INDEPENDENT_AMBULATORY_CARE_PROVIDER_SITE_OTHER): Payer: Medicare Other

## 2018-03-30 ENCOUNTER — Other Ambulatory Visit: Payer: Self-pay

## 2018-03-30 DIAGNOSIS — M25551 Pain in right hip: Secondary | ICD-10-CM | POA: Diagnosis not present

## 2018-03-30 DIAGNOSIS — M545 Low back pain, unspecified: Secondary | ICD-10-CM

## 2018-03-30 DIAGNOSIS — W19XXXA Unspecified fall, initial encounter: Secondary | ICD-10-CM

## 2018-03-30 DIAGNOSIS — M25552 Pain in left hip: Secondary | ICD-10-CM | POA: Diagnosis not present

## 2018-03-30 NOTE — Progress Notes (Signed)
Virtual Visit via Video Note  I connected with@ on 03/30/18 at 10:00 AM EDT by a video enabled telemedicine application and verified that I am speaking with the correct person using two identifiers.  Location patient: home Location provider:work or home office Persons participating in the virtual visit: patient, provider  I discussed the limitations of evaluation and management by telemedicine and the availability of in person appointments. The patient expressed understanding and agreed to proceed.   HPI: Hannah [name] states that this morning she was walking out to the mailbox and the next thing she knew she had fallen down.  The mailbox does have a slight decline but she said that everything happened so quickly she really cannot remember the details.  It was a neighbor outside when she fell that tried to help her up but was unable to get her up to her feet.  Car drove by with the man and woman who were able to get her back up into the house.  They contacted Mylan's daughters out to be with her for an appointment.  He states that her bottom hurts she denies any knowledge of head injury.  The daughter said checked her over and did not notice any bruising or scrapes.  She is not even sure what all she hit when she fell.  The fall was on concrete.  She denies any headaches dizziness or lightheadedness.  She denies any chest pain, chest pressure, heart racing, or shortness of breath.  Initially she states the pain is not severe if she is able to tolerate sitting.  Her daughter states she has been up some walking around the house.  But, on further questioning, she states that her pain is about a 9 out of 10.  She has difficulty during exam (see below) determining whether exam is causing increased pain.   ROS: See pertinent positives and negatives per HPI.  Histories below were reviewed. Past Medical History:  Diagnosis Date  . Acute massive pulmonary embolism (HCC) 07/13/2012   Massive PE w/ PEA arrest  07/13/12 >TNK >IVC filter >discharged on comadin    . Diabetes mellitus, type 2 (HCC) 07/15/2012   with peripheral neuropathy  . Hyperlipemia 07/14/2012  . Hypertension 07/14/2012  . Large bowel stricture (HCC)    s/p colectomy in 2016  . Osteoarthritis    s/p hip and knee replacements  . Thyroid disease     Past Surgical History:  Procedure Laterality Date  . COLONOSCOPY N/A 03/12/2016   Procedure: COLONOSCOPY;  Surgeon: Dorena Cookey, MD;  Location: [facility] ENDOSCOPY;  Service: Endoscopy;  Laterality: N/A;  . COLOSTOMY N/A 06/03/2014   Procedure: COLOSTOMY;  Surgeon: Harriette Bouillon, MD;  Location: [facility] OR;  Service: General;  Laterality: N/A;  . FLEXIBLE SIGMOIDOSCOPY N/A 05/30/2014   Procedure: Arnell Sieving;  Surgeon: Jeani Hawking, MD;  Location: [facility] ENDOSCOPY;  Service: Endoscopy;  Laterality: N/A;  . INSERTION OF VENA CAVA FILTER N/A 07/16/2012   Procedure: INSERTION OF VENA CAVA FILTER;  Surgeon: [name], MD;  Location: [facility] CATH LAB;  Service: Cardiovascular;  Laterality: N/A;  . KNEE ARTHROSCOPY    . PARTIAL COLECTOMY N/A 06/03/2014   Procedure: PARTIAL COLECTOMY;  Surgeon: Harriette Bouillon, MD;  Location: [facility] OR;  Service: General;  Laterality: N/A;  . REDUCTION MAMMAPLASTY Bilateral   . SP ARTHRO HIP*L*      Family History  Problem Relation Age of Onset  . Breast cancer Mother     SOCIAL HX: Lives alone; has two daughters close  by who are able to help her when needed.   Current Outpatient Medications:  .  acetaminophen (TYLENOL) 500 MG tablet, Take 500 mg by mouth every 6 (six) hours as needed for mild pain. , Disp: , Rfl:  .  furosemide (LASIX) 20 MG tablet, Take 1 tablet (20 mg total) by mouth daily. (Patient taking differently: Take 20 mg by mouth as needed for fluid or edema. ), Disp: 30 tablet, Rfl: 3 .  glucose blood (ONETOUCH VERIO) test strip, Use as instructed once a day, Disp: 100 each, Rfl: 5 .  glucose blood test strip, Use as instructed once a day, Disp: 100  each, Rfl: 3 .  levothyroxine (SYNTHROID, LEVOTHROID) 25 MCG tablet, Take 1 tablet by mouth  daily before breakfast (Patient taking differently: Take 25 mcg by mouth daily before breakfast. ), Disp: 90 tablet, Rfl: 1 .  metFORMIN (GLUCOPHAGE) 1000 MG tablet, TAKE ONE TABLET BY MOUTH WITH BREAKFAST AND TAKE ONE TABLET WITH DINNER (Patient taking differently: Take 1,000 mg by mouth 2 (two) times daily with a meal. ), Disp: 180 tablet, Rfl: 1 .  metoprolol tartrate (LOPRESSOR) 25 MG tablet, Take 1 tablet (25 mg total) by mouth 2 (two) times daily., Disp: 180 tablet, Rfl: 1 .  ONETOUCH DELICA LANCETS FINE MISC, USE ONCE DAILY., Disp: 100 each, Rfl: 3 .  polyethylene glycol (MIRALAX / GLYCOLAX) packet, Take 17 g by mouth daily. (Patient taking differently: Take 17 g by mouth every other day. ), Disp: 14 each, Rfl: 0 .  simvastatin (ZOCOR) 20 MG tablet, Take 1 tablet (20 mg total) by mouth at bedtime., Disp: 90 tablet, Rfl: 3 .  vitamin C (ASCORBIC ACID) 500 MG tablet, Take 500 mg by mouth daily. , Disp: , Rfl:  .  warfarin (COUMADIN) 5 MG tablet, Take 5 mg by mouth daily. , Disp: , Rfl:   EXAM:  VITALS: UTA  GENERAL: alert, oriented, appears well and in no acute distress  HEENT: atraumatic, conjunttiva clear, no obvious abnormalities on inspection of external nose and ears  NECK: normal movements of the head and neck  LUNGS: on inspection no signs of respiratory distress, breathing rate appears normal, no obvious gross SOB, gasping or wheezing  CV: no obvious cyanosis  MS: moves all visible extremities without noticeable abnormality. Video feed cut out part way through exam. I was able to see her stand, but not move lower extremities.  When her daughter palpates down her spine, she states there is no spinal tenderness except towards the most inferior part of palpable spine.  She has pain in the right posterior hip/SI area with hip flexion as well as extension.  She states that her discomfort  remains approximately the same with internal and external rotation, however, she later states that pain is so significant (rating as a 9 out of 10) that it is difficult to tell if these maneuvers are making the pain worse.  She adds that her right side never feels as good as her left side since she has had chronic issues including a total hip at the age of 78 years old.  On further questioning, she does seem to have some increased pain with external rotation on the right hip, there is no radiation of pain into the groin.  PSYCH/NEURO: pleasant and cooperative, no obvious depression or anxiety, speech and thought processing grossly intact. She is tearful; worries about being a burden to others.   SKIN: left SI area there is some early bruising apparent via  video. Approx 2cm in size (measured using daughters hand as guide). She has tenderness over this area. They state that area is not hard on palpation.   ASSESSMENT AND PLAN:  Discussed the following assessment and plan: 1. Right hip pain We discussed treatment options.  I am concerned with severity of pain, although her exam is somewhat reassuring and that we are not worsening pain with maneuvers.  My fear is that her pain will worsen over the weekend, and I would like to prevent her from having to go into a hospital setting given the current situation with coronavirus.  Her daughters have agreed to bring her here for x-ray, so we can rule out any fracture in the hip or lower spine.  I think this will be reassuring if negative and allow her to focus on symptomatic treatment.  I have instructed her to take some Tylenol, which is her typical medication for pains.  We will check in with her once we get x-ray results to see if this was helpful for her or not.  We discussed using heat to help with bruising, and what to expect with bruising in regards to being on Coumadin.  Last INR was with in 3 weeks and was at 3.  We discussed monitoring any hematoma  formation, and using heat to help dissipate bruising.  We also discussed using ice to help from an inflammatory standpoint.  We discussed gentle stretches with flexion and extension in a chair, and figure 4 exercises to help with stretching the hips and sacroiliac areas. - DG HIPS BILAT W OR W/O PELVIS 2V; Future  2. Acute low back pain without sciatica, unspecified back pain laterality See above - DG Lumbar Spine Complete; Future  3. Left hip pain - DG HIPS BILAT W OR W/O PELVIS 2V; Future  4. Fall, initial encounter - DG HIPS BILAT W OR W/O PELVIS 2V; Future - DG Lumbar Spine Complete; Future I discussed the assessment and treatment plan with the patient. The patient was provided an opportunity to ask questions and all were answered. The patient agreed with the plan and demonstrated an understanding of the instructions.   The patient was advised to call back or seek an in-person evaluation if the symptoms worsen or if the condition fails to improve as anticipated.  I provided 30 minutes of non-face-to-face time during this encounter.   Theodis Shove, MD

## 2018-03-30 NOTE — Telephone Encounter (Signed)
Pt's daughter called stating her mom was walking and slipped and fell on her buttocks. No bleeding. C/o pain with her tailbone and right knee. She did not hit her knee. Pt is on coumadin. Pt pain # is an 8 at this time. She denied dizziness. She had not traveled out of [place]. Per protocol, she would need to be seen within 24 hours. Flow at [facility] notified regarding appointment.  Pt's daughter was conference in  with the office.  Reason for Disposition . Taking Coumadin (warfarin) or other strong blood thinner, or known bleeding disorder (e.g., thrombocytopenia)  Answer Assessment - Initial Assessment Questions 1. APPEARANCE of BRUISE: "Describe the bruise."      No bruising noted yet 2. SIZE: "How large is the bruise?"      Can not see 3. NUMBER: "How many bruises are there?"      Would be one if one is there 4. LOCATION: "Where is the bruise located?"       Her bottom 5. ONSET: "How long ago did the bruise occur?"      10 or 15 mins ago 6. CAUSE: "Tell me how it happened."     Pt was walking and slipped and felled on her bottom 7. MEDICAL HISTORY: "Do you have any medical problems that can cause easy bruising or bleeding?" (e.g., leukemia, liver disease, recent chemotherapy)     no 8. MEDICATIONS : "Do you take any medications which thin the blood such as: aspirin, heparin, ibuprofen (NSAIDS), Plavix, or Coumadin?"     coumadin 9. OTHER SYMPTOMS: "Do you have any other symptoms?"  (e.g., weakness, dizziness, pain, fever, nosebleed, blood in urine/stool)     Pain now after fall 10. PREGNANCY: "Is there any chance you are pregnant?" "When was your last menstrual period?"       n/a  Protocols used: BRUISES-A-AH

## 2018-04-02 ENCOUNTER — Telehealth: Payer: Self-pay

## 2018-04-02 ENCOUNTER — Ambulatory Visit (INDEPENDENT_AMBULATORY_CARE_PROVIDER_SITE_OTHER): Payer: Medicare Other | Admitting: *Deleted

## 2018-04-02 ENCOUNTER — Ambulatory Visit: Payer: Medicare Other | Admitting: Internal Medicine

## 2018-04-02 ENCOUNTER — Other Ambulatory Visit: Payer: Self-pay

## 2018-04-02 DIAGNOSIS — I2699 Other pulmonary embolism without acute cor pulmonale: Secondary | ICD-10-CM | POA: Diagnosis not present

## 2018-04-02 DIAGNOSIS — Z5181 Encounter for therapeutic drug level monitoring: Secondary | ICD-10-CM | POA: Diagnosis not present

## 2018-04-02 DIAGNOSIS — Z7901 Long term (current) use of anticoagulants: Secondary | ICD-10-CM

## 2018-04-02 LAB — POCT INR: INR: 2.3 (ref 2.0–3.0)

## 2018-04-02 NOTE — Telephone Encounter (Signed)
Author phoned pt. to assess interest in scheduling virtual awv. No answer, author left detailed VM asking for return call.   

## 2018-04-13 ENCOUNTER — Telehealth: Payer: Self-pay

## 2018-04-13 DIAGNOSIS — Z933 Colostomy status: Secondary | ICD-10-CM | POA: Diagnosis not present

## 2018-04-13 DIAGNOSIS — K56609 Unspecified intestinal obstruction, unspecified as to partial versus complete obstruction: Secondary | ICD-10-CM | POA: Diagnosis not present

## 2018-04-13 DIAGNOSIS — Z4801 Encounter for change or removal of surgical wound dressing: Secondary | ICD-10-CM | POA: Diagnosis not present

## 2018-04-13 DIAGNOSIS — E119 Type 2 diabetes mellitus without complications: Secondary | ICD-10-CM | POA: Diagnosis not present

## 2018-04-13 NOTE — Telephone Encounter (Signed)
Follow Up:; ° ° °Returning your call. °

## 2018-04-13 NOTE — Telephone Encounter (Signed)
lmom for prescreen/drive thru 

## 2018-04-14 ENCOUNTER — Other Ambulatory Visit: Payer: Self-pay | Admitting: Family Medicine

## 2018-04-16 ENCOUNTER — Ambulatory Visit (INDEPENDENT_AMBULATORY_CARE_PROVIDER_SITE_OTHER): Payer: Medicare Other | Admitting: *Deleted

## 2018-04-16 ENCOUNTER — Other Ambulatory Visit: Payer: Self-pay

## 2018-04-16 DIAGNOSIS — I2699 Other pulmonary embolism without acute cor pulmonale: Secondary | ICD-10-CM | POA: Diagnosis not present

## 2018-04-16 DIAGNOSIS — Z7901 Long term (current) use of anticoagulants: Secondary | ICD-10-CM

## 2018-04-16 DIAGNOSIS — Z5181 Encounter for therapeutic drug level monitoring: Secondary | ICD-10-CM | POA: Diagnosis not present

## 2018-04-16 LAB — POCT INR: INR: 2.6 (ref 2.0–3.0)

## 2018-04-16 NOTE — Patient Instructions (Signed)
Description   Spoke with Hannah [name] pt's dtr and instructed pt to continue same dosage 1/2 tablet daily except 1 tablet on Sundays, Tuesdays and Thursdays. Recheck in 3 weeks. Please resume normal dark green leafy intake weekly.  Call with any new meds or procedures [phone]

## 2018-04-25 ENCOUNTER — Other Ambulatory Visit: Payer: Self-pay | Admitting: Family Medicine

## 2018-05-04 ENCOUNTER — Telehealth: Payer: Self-pay

## 2018-05-04 NOTE — Telephone Encounter (Signed)

## 2018-05-07 ENCOUNTER — Other Ambulatory Visit: Payer: Self-pay

## 2018-05-07 ENCOUNTER — Ambulatory Visit (INDEPENDENT_AMBULATORY_CARE_PROVIDER_SITE_OTHER): Payer: Medicare Other | Admitting: *Deleted

## 2018-05-07 DIAGNOSIS — Z5181 Encounter for therapeutic drug level monitoring: Secondary | ICD-10-CM | POA: Diagnosis not present

## 2018-05-07 DIAGNOSIS — I2699 Other pulmonary embolism without acute cor pulmonale: Secondary | ICD-10-CM | POA: Diagnosis not present

## 2018-05-07 DIAGNOSIS — Z7901 Long term (current) use of anticoagulants: Secondary | ICD-10-CM | POA: Diagnosis not present

## 2018-05-07 LAB — POCT INR: INR: 2.9 (ref 2.0–3.0)

## 2018-05-07 NOTE — Patient Instructions (Signed)
Description   Spoke with Hannah [name] pt's dtr (on DPR) and instructed pt to continue same dosage 1/2 tablet daily except 1 tablet on Sundays, Tuesdays and Thursdays. Recheck in 4 weeks. Please resume normal dark green leafy intake weekly.  Call with any new meds or procedures [phone]

## 2018-05-14 ENCOUNTER — Telehealth: Payer: Self-pay | Admitting: *Deleted

## 2018-05-14 NOTE — Telephone Encounter (Signed)
Copied from CRM [id number]. Topic: General - Other >> May 14, 2018  2:22 PM Herby Abraham C wrote: Reason for CRM: pt's daughter called in to schedule pt a 3 month follow up with [facility] per last visit in March.   [name] : [phone] pt's  daughter

## 2018-05-21 ENCOUNTER — Telehealth: Payer: Self-pay | Admitting: Family Medicine

## 2018-05-21 ENCOUNTER — Emergency Department (HOSPITAL_COMMUNITY): Payer: Medicare Other

## 2018-05-21 ENCOUNTER — Emergency Department (HOSPITAL_COMMUNITY)
Admission: EM | Admit: 2018-05-21 | Discharge: 2018-05-21 | Disposition: A | Discharge status: Home / Self Care | Payer: Medicare Other | Attending: Emergency Medicine | Admitting: Emergency Medicine

## 2018-05-21 ENCOUNTER — Other Ambulatory Visit: Payer: Self-pay

## 2018-05-21 ENCOUNTER — Ambulatory Visit (INDEPENDENT_AMBULATORY_CARE_PROVIDER_SITE_OTHER): Payer: Medicare Other | Admitting: Family Medicine

## 2018-05-21 DIAGNOSIS — I10 Essential (primary) hypertension: Secondary | ICD-10-CM | POA: Diagnosis not present

## 2018-05-21 DIAGNOSIS — I110 Hypertensive heart disease with heart failure: Secondary | ICD-10-CM | POA: Insufficient documentation

## 2018-05-21 DIAGNOSIS — R55 Syncope and collapse: Secondary | ICD-10-CM | POA: Diagnosis not present

## 2018-05-21 DIAGNOSIS — Z87891 Personal history of nicotine dependence: Secondary | ICD-10-CM | POA: Diagnosis not present

## 2018-05-21 DIAGNOSIS — Y999 Unspecified external cause status: Secondary | ICD-10-CM | POA: Insufficient documentation

## 2018-05-21 DIAGNOSIS — W1811XA Fall from or off toilet without subsequent striking against object, initial encounter: Secondary | ICD-10-CM | POA: Diagnosis not present

## 2018-05-21 DIAGNOSIS — R296 Repeated falls: Secondary | ICD-10-CM

## 2018-05-21 DIAGNOSIS — E039 Hypothyroidism, unspecified: Secondary | ICD-10-CM | POA: Insufficient documentation

## 2018-05-21 DIAGNOSIS — E119 Type 2 diabetes mellitus without complications: Secondary | ICD-10-CM | POA: Diagnosis not present

## 2018-05-21 DIAGNOSIS — E038 Other specified hypothyroidism: Secondary | ICD-10-CM

## 2018-05-21 DIAGNOSIS — Z7984 Long term (current) use of oral hypoglycemic drugs: Secondary | ICD-10-CM | POA: Diagnosis not present

## 2018-05-21 DIAGNOSIS — S8992XA Unspecified injury of left lower leg, initial encounter: Secondary | ICD-10-CM | POA: Diagnosis not present

## 2018-05-21 DIAGNOSIS — Z79899 Other long term (current) drug therapy: Secondary | ICD-10-CM | POA: Diagnosis not present

## 2018-05-21 DIAGNOSIS — E1140 Type 2 diabetes mellitus with diabetic neuropathy, unspecified: Secondary | ICD-10-CM | POA: Diagnosis not present

## 2018-05-21 DIAGNOSIS — R531 Weakness: Secondary | ICD-10-CM

## 2018-05-21 DIAGNOSIS — Y92099 Unspecified place in other non-institutional residence as the place of occurrence of the external cause: Secondary | ICD-10-CM | POA: Diagnosis not present

## 2018-05-21 DIAGNOSIS — Z7901 Long term (current) use of anticoagulants: Secondary | ICD-10-CM

## 2018-05-21 DIAGNOSIS — M25562 Pain in left knee: Secondary | ICD-10-CM | POA: Diagnosis not present

## 2018-05-21 DIAGNOSIS — S8002XA Contusion of left knee, initial encounter: Secondary | ICD-10-CM | POA: Diagnosis not present

## 2018-05-21 DIAGNOSIS — I5032 Chronic diastolic (congestive) heart failure: Secondary | ICD-10-CM | POA: Diagnosis not present

## 2018-05-21 DIAGNOSIS — W19XXXA Unspecified fall, initial encounter: Secondary | ICD-10-CM | POA: Diagnosis not present

## 2018-05-21 DIAGNOSIS — S199XXA Unspecified injury of neck, initial encounter: Secondary | ICD-10-CM | POA: Diagnosis not present

## 2018-05-21 DIAGNOSIS — Y939 Activity, unspecified: Secondary | ICD-10-CM | POA: Diagnosis not present

## 2018-05-21 DIAGNOSIS — S0990XA Unspecified injury of head, initial encounter: Secondary | ICD-10-CM | POA: Diagnosis not present

## 2018-05-21 DIAGNOSIS — R52 Pain, unspecified: Secondary | ICD-10-CM | POA: Diagnosis not present

## 2018-05-21 LAB — CBC WITH DIFFERENTIAL/PLATELET
Abs Immature Granulocytes: 0.05 10*3/uL (ref 0.00–0.07)
Basophils Absolute: 0.1 10*3/uL (ref 0.0–0.1)
Basophils Relative: 1 %
Eosinophils Absolute: 0.2 10*3/uL (ref 0.0–0.5)
Eosinophils Relative: 2 %
HCT: 43.5 % (ref 36.0–46.0)
Hemoglobin: 13.3 g/dL (ref 12.0–15.0)
Immature Granulocytes: 1 %
Lymphocytes Relative: 29 %
Lymphs Abs: 3.1 10*3/uL (ref 0.7–4.0)
MCH: 27.7 pg (ref 26.0–34.0)
MCHC: 30.6 g/dL (ref 30.0–36.0)
MCV: 90.4 fL (ref 80.0–100.0)
Monocytes Absolute: 0.6 10*3/uL (ref 0.1–1.0)
Monocytes Relative: 5 %
Neutro Abs: 6.8 10*3/uL (ref 1.7–7.7)
Neutrophils Relative %: 62 %
Platelets: 216 10*3/uL (ref 150–400)
RBC: 4.81 MIL/uL (ref 3.87–5.11)
RDW: 17 % — ABNORMAL HIGH (ref 11.5–15.5)
WBC: 10.7 10*3/uL — ABNORMAL HIGH (ref 4.0–10.5)
nRBC: 0 % (ref 0.0–0.2)

## 2018-05-21 LAB — URINALYSIS, ROUTINE W REFLEX MICROSCOPIC
Bilirubin Urine: NEGATIVE
Glucose, UA: NEGATIVE mg/dL
Hgb urine dipstick: NEGATIVE
Ketones, ur: NEGATIVE mg/dL
Leukocytes,Ua: NEGATIVE
Nitrite: NEGATIVE
Protein, ur: NEGATIVE mg/dL
Specific Gravity, Urine: 1.02 (ref 1.005–1.030)
pH: 5 (ref 5.0–8.0)

## 2018-05-21 LAB — BASIC METABOLIC PANEL
Anion gap: 11 (ref 5–15)
BUN: 23 mg/dL (ref 8–23)
CO2: 20 mmol/L — ABNORMAL LOW (ref 22–32)
Calcium: 9.3 mg/dL (ref 8.9–10.3)
Chloride: 108 mmol/L (ref 98–111)
Creatinine, Ser: 1.31 mg/dL — ABNORMAL HIGH (ref 0.44–1.00)
GFR calc Af Amer: 45 mL/min — ABNORMAL LOW (ref >60)
GFR calc non Af Amer: 39 mL/min — ABNORMAL LOW (ref >60)
Glucose, Bld: 112 mg/dL — ABNORMAL HIGH (ref 70–99)
Potassium: 4.2 mmol/L (ref 3.5–5.1)
Sodium: 139 mmol/L (ref 135–145)

## 2018-05-21 LAB — PROTIME-INR
INR: 2.1 — ABNORMAL HIGH (ref 0.8–1.2)
Prothrombin Time: 23.5 seconds — ABNORMAL HIGH (ref 11.4–15.2)

## 2018-05-21 NOTE — ED Triage Notes (Signed)
Per ems pt is at daughters house due to confusion and not wanting to eat. Pt has not been self per family. Hx of UTI, pt was on toilet when she stood up her right knee buckled and she went down to ground. Pt did not hit her head nor LOC, pt is now complaining of left knee pain. cbg 115, 99% ra, 97.5.

## 2018-05-21 NOTE — Telephone Encounter (Signed)
Ok if there is opening and you check with that provider to see if they would see her just for purpose of getting her wheelchair or hosp followup if they are willing? However, the wheelchair or needed DME usually this is something that should be set up by discharge planning at hospital?

## 2018-05-21 NOTE — ED Provider Notes (Signed)
[facility] EMERGENCY DEPARTMENT Provider Note   CSN: [id number] Arrival date & time: 05/21/18  0605    History   Chief Complaint Chief Complaint  Patient presents with  . Fall    possible uti    HPI Hannah [name] is a 78 y.o. female.   The history is provided by the patient and the EMS personnel.  Fall   She has history of hypertension, diabetes, hyperlipidemia, pulmonary embolism anticoagulated on warfarin, diastolic heart failure, pulmonary hypertension and comes in following a fall at home.  She states that she went to the bathroom and emptied her colostomy bag.  She then tried to get up from the commode and did not have the strength and fell.  She is complaining of pain in her left knee.  She denies head injury and family concerned that she did not hit her head.  She denies other injury.  While she denies dysuria, she has had urinary urgency and frequency and tenesmus.  Family reports that she is not quite acting like her self and this is the way she normally is when she has a urinary infection.  Past Medical History:  Diagnosis Date  . Acute massive pulmonary embolism (HCC) 07/13/2012   Massive PE w/ PEA arrest 07/13/12 >TNK >IVC filter >discharged on comadin    . Diabetes mellitus, type 2 (HCC) 07/15/2012   with peripheral neuropathy  . Hyperlipemia 07/14/2012  . Hypertension 07/14/2012  . Large bowel stricture (HCC)    s/p colectomy in 2016  . Osteoarthritis    s/p hip and knee replacements  . Thyroid disease     Patient Active Problem List   Diagnosis Date Noted  . Encounter for therapeutic drug monitoring 05/07/2018  . Large bowel stricture (HCC) 12/15/2017  . Chronic diastolic CHF (congestive heart failure) (HCC) 03/15/2017  . Anticoagulated on warfarin   . Pulmonary hypertension (HCC) 07/26/2016  . Neuropathy 02/12/2015  . Osteoarthritis 08/12/2014  . History of pulmonary embolism 06/10/2013  . Hypothyroid 06/06/2013  . Essential hypertension  06/06/2013  . Diabetes mellitus with neurological manifestations, controlled (HCC) 06/06/2013    Past Surgical History:  Procedure Laterality Date  . COLONOSCOPY N/A 03/12/2016   Procedure: COLONOSCOPY;  Surgeon: Dorena Cookey, MD;  Location: [facility] ENDOSCOPY;  Service: Endoscopy;  Laterality: N/A;  . COLOSTOMY N/A 06/03/2014   Procedure: COLOSTOMY;  Surgeon: Harriette Bouillon, MD;  Location: [facility] OR;  Service: General;  Laterality: N/A;  . FLEXIBLE SIGMOIDOSCOPY N/A 05/30/2014   Procedure: Arnell Sieving;  Surgeon: Jeani Hawking, MD;  Location: [facility] ENDOSCOPY;  Service: Endoscopy;  Laterality: N/A;  . INSERTION OF VENA CAVA FILTER N/A 07/16/2012   Procedure: INSERTION OF VENA CAVA FILTER;  Surgeon: [name], MD;  Location: [facility] CATH LAB;  Service: Cardiovascular;  Laterality: N/A;  . KNEE ARTHROSCOPY    . PARTIAL COLECTOMY N/A 06/03/2014   Procedure: PARTIAL COLECTOMY;  Surgeon: Harriette Bouillon, MD;  Location: [facility] OR;  Service: General;  Laterality: N/A;  . REDUCTION MAMMAPLASTY Bilateral   . SP ARTHRO HIP*L*       OB History   No obstetric history on file.      Home Medications    Prior to Admission medications   Medication Sig Start Date End Date Taking? Authorizing Provider  acetaminophen (TYLENOL) 500 MG tablet Take 500 mg by mouth every 6 (six) hours as needed for mild pain.     [provider]  furosemide (LASIX) 20 MG tablet Take 1 tablet (20 mg  total) by mouth daily. Patient taking differently: Take 20 mg by mouth as needed for fluid or edema.  03/09/17   [name], DO  glucose blood (ONETOUCH VERIO) test strip Use as instructed once a day 09/19/17   Kriste Basque R, DO  glucose blood test strip Use as instructed once a day 05/26/17   [name], DO  levothyroxine (SYNTHROID, LEVOTHROID) 25 MCG tablet TAKE 1 TABLET BY MOUTH ONCE DAILY BEFORE BREAKFAST 04/16/18   [name], DO  lisinopril (ZESTRIL) 40 MG tablet Take 1 tablet by mouth once daily 04/26/18   [name], DO  metFORMIN (GLUCOPHAGE) 1000 MG tablet TAKE ONE TABLET BY MOUTH WITH BREAKFAST AND TAKE ONE TABLET WITH DINNER Patient taking differently: Take 1,000 mg by mouth 2 (two) times daily with a meal.  11/22/17   [name], DO  metoprolol tartrate (LOPRESSOR) 25 MG tablet Take 1 tablet (25 mg total) by mouth 2 (two) times daily. 06/09/17   [name], DO  ONETOUCH DELICA LANCETS FINE MISC USE ONCE DAILY. 05/18/17   [name], DO  polyethylene glycol (MIRALAX / GLYCOLAX) packet Take 17 g by mouth daily. Patient taking differently: Take 17 g by mouth every other day.  03/19/17   Zigmund Daniel., MD  simvastatin (ZOCOR) 20 MG tablet Take 1 tablet (20 mg total) by mouth at bedtime. 10/26/17   [name], DO  vitamin C (ASCORBIC ACID) 500 MG tablet Take 500 mg by mouth daily.     [provider]  warfarin (COUMADIN) 5 MG tablet Take 5 mg by mouth daily.  12/22/17   [provider]    Family History Family History  Problem Relation Age of Onset  . Breast cancer Mother     Social History Social History   Tobacco Use  . Smoking status: Former Smoker    Packs/day: 1.00    Years: 10.00    Pack years: 10.00    Types: Cigarettes    Last attempt to quit: 01/04/1968    Years since quitting: 50.4  . Smokeless tobacco: Never Used  Substance Use Topics  . Alcohol use: No  . Drug use: No     Allergies   Augmentin [amoxicillin-pot clavulanate]; Tranxene [clorazepate]; and Penicillins   Review of Systems Review of Systems  All other systems reviewed and are negative.    Physical Exam Updated Vital Signs BP (!) 155/92 (BP Location: Right Arm)   Pulse 85   Temp 98.4 F (36.9 C) (Oral)   Resp 14   SpO2 98%   Physical Exam Vitals signs and nursing note reviewed.    78 year old female, resting comfortably and in no acute distress. Vital signs are significant for elevated blood pressure. Oxygen saturation is 98%, which is normal. Head is  normocephalic and atraumatic. PERRLA, EOMI. Oropharynx is clear. Neck is nontender without adenopathy or JVD. Back is nontender and there is no CVA tenderness. Lungs are clear without rales, wheezes, or rhonchi. Chest is nontender. Heart has regular rate and rhythm without murmur. Abdomen is soft, flat, nontender without masses or hepatosplenomegaly and peristalsis is normoactive.  Colostomy present left lower quadrant. Extremities have no cyanosis or edema, full range of motion is present.  There is mild pain on passive range of motion of the left knee.  There is no significant swelling or deformity. Skin is warm and dry without rash. Neurologic: Mental status is normal, cranial nerves are intact, there  are no motor or sensory deficits.  ED Treatments / Results  Labs (all labs ordered are listed, but only abnormal results are displayed) Labs Reviewed  URINE CULTURE  PROTIME-INR  URINALYSIS, ROUTINE W REFLEX MICROSCOPIC  BASIC METABOLIC PANEL  CBC WITH DIFFERENTIAL/PLATELET    EKG EKG Interpretation  Date/Time:  Monday May 21 2018 06:16:05 EDT Ventricular Rate:  80 PR Interval:    QRS Duration: 132 QT Interval:  363 QTC Calculation: 419 R Axis:   -60 Text Interpretation:  Sinus rhythm LVH with IVCD, LAD and secondary repol abnrm When compared with ECG of 12/15/2017, HEART RATE has decreased Confirmed by Dione Booze ([id number]) on 05/21/2018 6:19:47 AM   Radiology No results found.  Procedures Procedures   Medications Ordered in ED Medications - No data to display   Initial Impression / Assessment and Plan / ED Course  I have reviewed the triage vital signs and the nursing notes.  Pertinent labs & imaging results that were available during my care of the patient were reviewed by me and considered in my medical decision making (see chart for details).  Fall at home and patient was anticoagulated on warfarin.  Will need to check INR and check CT of head and cervical spine.   Minor trauma to the left knee.  Doubt significant injury given history of total knee arthroplasty, but will check x-ray.  Possible urinary tract infection.  Urine will be obtained by in and out catheter.  We will also check screening labs.  Old records are reviewed, and she does have prior hospitalizations for urinary tract infection.  ECG is unchanged from prior except that the heart rate is slower.  X-rays and labs are pending.  Case is signed out to Dr. Juleen China.  Final Clinical Impressions(s) / ED Diagnoses   Final diagnoses:  Fall at home, initial encounter  Contusion of left knee, initial encounter  Anticoagulated on warfarin    ED Discharge Orders    None       Dione Booze, MD 05/21/18 [phone]

## 2018-05-21 NOTE — Telephone Encounter (Signed)
Patients daughter , [name] was calling to get a wheelchair today. The patient was just released from the hospital today and needs a wheelchair. The patient also ha s a TOC appointment Wednesday. I told [name] that the patient needs to be seen by her provider to [facility] and go over the hospital follow virtually. [name] was hopeing that the patient can be seen today by another provider so she can get a wheelchair today.  Please Advise

## 2018-05-21 NOTE — Progress Notes (Signed)
Patient ID: Hannah [name], female   DOB: September 26, 1940, 78 y.o.   MRN: [id number]  This visit type was conducted due to national recommendations for restrictions regarding the COVID-19 pandemic in an effort to limit this patient's exposure and mitigate transmission in our community.   Virtual Visit via Video Note  I connected with Hannah [name] on 05/21/18 at  4:15 PM EDT by a video enabled telemedicine application and verified that I am speaking with the correct person using two identifiers.  Location patient: home Location provider:work or home office Persons participating in the virtual visit: patient, provider, patient's daughter Hannah [name]).  I discussed the limitations of evaluation and management by telemedicine and the availability of in person appointments. The patient expressed understanding and agreed to proceed.   HPI: This is a ER follow-up visit.  Patient is in process of transitioning to another primary care provider (Dr [name] to Dr Hassan Rowan).  She has chronic problems including hypertension, diastolic heart failure, hypothyroidism, type 2 diabetes with neuropathy, history of pulmonary embolism, chronic Coumadin therapy, and frequent falls.  Family relates that she has had at least a few falls in recent months.  Yesterday she had some nausea without vomiting, increased lethargy, and some generalized weakness.  She had had similar symptoms with UTI in the past.  They called EMS and apparently her vital signs were stable.  She had gone to the bathroom and apparently she has a fairly low commode and lost balance and fell.  There was no reported head injury.  She has had previous knee replacement surgery and had some knee pain.  Multiple x-rays including the left knee which showed evidence for total knee arthroplasty but negative for fracture or dislocation.  CT head and CT cervical spine no acute findings.  Labs were stable with INR 2.1.  White blood count minimally elevated 10.7 thousand with  hemoglobin 13.3.  Basic metabolic panel creatinine slightly elevated 1.31.  Sodium and potassium normal.  Urine dipstick unremarkable.  Patient was sent back home.  Family's concern is that she has been staying alone but has had gradual progressive generalized weakness over several weeks to months and increasing falls.  She has had some previous physical therapy several months ago but apparently this lasted for only couple of weeks.  Patient currently ambulates with a cane or walker.  She is having increasing difficulties with transfers.    ROS: See pertinent positives and negatives per HPI.  Past Medical History:  Diagnosis Date  . Acute massive pulmonary embolism (HCC) 07/13/2012   Massive PE w/ PEA arrest 07/13/12 >TNK >IVC filter >discharged on comadin    . Diabetes mellitus, type 2 (HCC) 07/15/2012   with peripheral neuropathy  . Hyperlipemia 07/14/2012  . Hypertension 07/14/2012  . Large bowel stricture (HCC)    s/p colectomy in 2016  . Osteoarthritis    s/p hip and knee replacements  . Thyroid disease     Past Surgical History:  Procedure Laterality Date  . COLONOSCOPY N/A 03/12/2016   Procedure: COLONOSCOPY;  Surgeon: Dorena Cookey, MD;  Location: [facility] ENDOSCOPY;  Service: Endoscopy;  Laterality: N/A;  . COLOSTOMY N/A 06/03/2014   Procedure: COLOSTOMY;  Surgeon: Harriette Bouillon, MD;  Location: [facility] OR;  Service: General;  Laterality: N/A;  . FLEXIBLE SIGMOIDOSCOPY N/A 05/30/2014   Procedure: Arnell Sieving;  Surgeon: Jeani Hawking, MD;  Location: [facility] ENDOSCOPY;  Service: Endoscopy;  Laterality: N/A;  . INSERTION OF VENA CAVA FILTER N/A 07/16/2012   Procedure: INSERTION OF VENA CAVA FILTER;  Surgeon: [name], MD;  Location[facility] CATH LAB;  Service: Cardiovascular;  Laterality: N/A;  . KNEE ARTHROSCOPY    . PARTIAL COLECTOMY N/A 06/03/2014   Procedure: PARTIAL COLECTOMY;  Surgeon: Harriette Bouillon, MD;  Location: [facility] OR;  Service: General;  Laterality: N/A;  . REDUCTION MAMMAPLASTY  Bilateral   . SP ARTHRO HIP*L*      Family History  Problem Relation Age of Onset  . Breast cancer Mother     SOCIAL HX: Lives alone but has several supportive family members.  They are currently trying to stay with her 24/7 until he can get some further help   Current Outpatient Medications:  .  acetaminophen (TYLENOL) 500 MG tablet, Take 500 mg by mouth every 6 (six) hours as needed for mild pain. , Disp: , Rfl:  .  furosemide (LASIX) 20 MG tablet, Take 1 tablet (20 mg total) by mouth daily. (Patient taking differently: Take 20 mg by mouth as needed for fluid or edema. ), Disp: 30 tablet, Rfl: 3 .  glucose blood (ONETOUCH VERIO) test strip, Use as instructed once a day, Disp: 100 each, Rfl: 5 .  glucose blood test strip, Use as instructed once a day, Disp: 100 each, Rfl: 3 .  levothyroxine (SYNTHROID, LEVOTHROID) 25 MCG tablet, TAKE 1 TABLET BY MOUTH ONCE DAILY BEFORE BREAKFAST, Disp: 90 tablet, Rfl: 1 .  lisinopril (ZESTRIL) 40 MG tablet, Take 1 tablet by mouth once daily, Disp: 90 tablet, Rfl: 0 .  metFORMIN (GLUCOPHAGE) 1000 MG tablet, TAKE ONE TABLET BY MOUTH WITH BREAKFAST AND TAKE ONE TABLET WITH DINNER (Patient taking differently: Take 1,000 mg by mouth 2 (two) times daily with a meal. ), Disp: 180 tablet, Rfl: 1 .  metoprolol tartrate (LOPRESSOR) 25 MG tablet, Take 1 tablet (25 mg total) by mouth 2 (two) times daily., Disp: 180 tablet, Rfl: 1 .  ONETOUCH DELICA LANCETS FINE MISC, USE ONCE DAILY., Disp: 100 each, Rfl: 3 .  polyethylene glycol (MIRALAX / GLYCOLAX) packet, Take 17 g by mouth daily. (Patient taking differently: Take 17 g by mouth every other day. ), Disp: 14 each, Rfl: 0 .  simvastatin (ZOCOR) 20 MG tablet, Take 1 tablet (20 mg total) by mouth at bedtime., Disp: 90 tablet, Rfl: 3 .  vitamin C (ASCORBIC ACID) 500 MG tablet, Take 500 mg by mouth daily. , Disp: , Rfl:  .  warfarin (COUMADIN) 5 MG tablet, Take 5 mg by mouth daily. , Disp: , Rfl:   EXAM:  VITALS per  patient if applicable:  GENERAL: alert, oriented, appears well and in no acute distress  HEENT: atraumatic, conjunttiva clear, no obvious abnormalities on inspection of external nose and ears  NECK: normal movements of the head and neck  LUNGS: on inspection no signs of respiratory distress, breathing rate appears normal, no obvious gross SOB, gasping or wheezing  CV: no obvious cyanosis  MS: moves all visible extremities without noticeable abnormality  PSYCH/NEURO: pleasant and cooperative, no obvious depression or anxiety, speech and thought processing grossly intact  ASSESSMENT AND PLAN:  Discussed the following assessment and plan:  #1 increased generalized weakness and increasing frequency of falls and instability.  Patient had ER evaluation as above.  No acute findings with labs or x-ray  -Recommend trying to maximize home health services.  Referral for home health consultation with physical therapy, Occupational Therapy, and nursing along with medical social work  #2 type 2 diabetes-overdue for follow-up A1c  #3 history of pulmonary emboli on chronic  Coumadin therapy with recent therapeutic INR as above  #4 history of reported diastolic heart failure  #5 hypertension  #6 hypothyroidism on low-dose levothyroxine  Patient has follow-up with her new provider this Wednesday   I discussed the assessment and treatment plan with the patient. The patient was provided an opportunity to ask questions and all were answered. The patient agreed with the plan and demonstrated an understanding of the instructions.   The patient was advised to call back or seek an in-person evaluation if the symptoms worsen or if the condition fails to improve as anticipated.   Evelena Peat, MD   [facility]

## 2018-05-21 NOTE — Telephone Encounter (Signed)
I spoke with Dr Caryl Never and he agreed to evaluate the pt for a virtual visit today at 4:15pm.  Message sent to [facility] in case a TCM is needed.

## 2018-05-21 NOTE — ED Notes (Signed)
Back from CT

## 2018-05-21 NOTE — ED Notes (Signed)
Patient transported to CT 

## 2018-05-22 LAB — URINE CULTURE: Culture: NO GROWTH

## 2018-05-23 ENCOUNTER — Other Ambulatory Visit: Payer: Self-pay

## 2018-05-23 ENCOUNTER — Ambulatory Visit (INDEPENDENT_AMBULATORY_CARE_PROVIDER_SITE_OTHER): Payer: Medicare Other | Admitting: Family Medicine

## 2018-05-23 ENCOUNTER — Encounter: Payer: Self-pay | Admitting: Family Medicine

## 2018-05-23 DIAGNOSIS — R630 Anorexia: Secondary | ICD-10-CM

## 2018-05-23 DIAGNOSIS — R29898 Other symptoms and signs involving the musculoskeletal system: Secondary | ICD-10-CM

## 2018-05-23 DIAGNOSIS — I10 Essential (primary) hypertension: Secondary | ICD-10-CM

## 2018-05-23 DIAGNOSIS — M25562 Pain in left knee: Secondary | ICD-10-CM

## 2018-05-23 DIAGNOSIS — S8002XA Contusion of left knee, initial encounter: Secondary | ICD-10-CM | POA: Diagnosis not present

## 2018-05-23 DIAGNOSIS — I5032 Chronic diastolic (congestive) heart failure: Secondary | ICD-10-CM

## 2018-05-23 DIAGNOSIS — E1142 Type 2 diabetes mellitus with diabetic polyneuropathy: Secondary | ICD-10-CM | POA: Diagnosis not present

## 2018-05-23 MED ORDER — HYDROCODONE-ACETAMINOPHEN 5-325 MG PO TABS: 1.0000 | ORAL_TABLET | Freq: Three times a day (TID) | ORAL | 0 refills | Status: DC | PRN

## 2018-05-23 MED ORDER — LEVOTHYROXINE SODIUM 25 MCG PO TABS: ORAL_TABLET | ORAL | 1 refills | Status: DC

## 2018-05-23 NOTE — Progress Notes (Addendum)
Virtual Visit via Video Note  I connected with Hannah [name]   on 05/23/18 at 11:00 AM EDT by a video enabled telemedicine application and verified that I am speaking with the correct person using two identifiers.  Location patient: home Location provider:work office Persons participating in the virtual visit: patient, provider  I discussed the limitations of evaluation and management by telemedicine and the availability of in person appointments. The patient expressed understanding and agreed to proceed.   Hannah [name] DOB: [birth year] Encounter date: 05/23/2018  This is a 78 y.o. female who presents to establish care. Chief Complaint  Patient presents with  . Establish Care    transfer from Dr [name]    History of present illness: Today's visit was supposed to be a transfer of care, but due to recent hospitalization and some pressing concerns that patient/family have we will postpone the transfer of care visit and work on addressing other issues.  Has had a lot going on. She had a fall and ended up in ER on Monday. She fell in bathroom trying to get up from toilet. After leaving ER didn't go home; went to daughters house because she was too weak to go home and couldn't get around (per family). No mobility due to knee that was injured with fall. No broken bones, but not able to use this knee/ weight bear on leg. Wanting MRI for further evaluation because they feel there is something else going on. Left knee is very swollen; she has significant pain with standing, trying to weight bear, painful to bend knee (bending only about 10 degrees before pain). Just light touch is causing significant pain. They feel like swelling is a little worse than it was 2 days ago. No problems with walking on knee prior to the fall. Was walking ok on this knee prior to fall. Taking tylenol for pain; but not getting much relief with this. Knee was awhile ago and not in this state. They think it was around 1987. No pain  in the left hip or ankle. Pain level is very high (10/10).    Right now she cannot get up at all. She is completely dependent on family. They have portable commode. The assist her with getting up to toilet on this. They have not bathed her (home health has been ordered, and they feel they need to wait because they are worried about her falling/not knowing how to help her with this).   Appetite is poor. It has been poor for awhile.  At home was only doing 1 meal per day. Not eating much and not eating great variety.   When asked about her mood she states she doesn't feel her mood is a problem. She is very grateful for supportive 3 children, grandchildren and feels blessed to have them taking care of her.  She feels they are working very hard to address all of her needs.    YQM:GNOIBBCWUG 40mg , lopressor 25mg  QB:VQXIH 20mg  CHF: on lasix 20mg  daily prn Hypothyroid:on synthroid DMII: we did not specifically discuss sugars today Neuropathy: did not specifically discuss today Hx of massive PE; on warfarin: getting regular INRs through cardiac clinic.  Last bloodwork 05/21/2018 with slight WBC elevation, but stable Hb. Creat elevated 1.31, BUN 23. Last lipid 03/2017. Last A1C was 09/2017 - 6.4.  Past Medical History:  Diagnosis Date  . Acute massive pulmonary embolism (HCC) 07/13/2012   Massive PE w/ PEA arrest 07/13/12 >TNK >IVC filter >discharged on comadin    . Diabetes  mellitus, type 2 (HCC) 07/15/2012   with peripheral neuropathy  . Hyperlipemia 07/14/2012  . Hypertension 07/14/2012  . Large bowel stricture (HCC)    s/p colectomy in 2016  . Osteoarthritis    s/p hip and knee replacements  . Thyroid disease    Past Surgical History:  Procedure Laterality Date  . COLONOSCOPY N/A 03/12/2016   Procedure: COLONOSCOPY;  Surgeon: Dorena Cookey, MD;  Location: [facility] ENDOSCOPY;  Service: Endoscopy;  Laterality: N/A;  . COLOSTOMY N/A 06/03/2014   Procedure: COLOSTOMY;  Surgeon: Harriette Bouillon, MD;   Location: [facility] OR;  Service: General;  Laterality: N/A;  . FLEXIBLE SIGMOIDOSCOPY N/A 05/30/2014   Procedure: Arnell Sieving;  Surgeon: Jeani Hawking, MD;  Location: [facility] ENDOSCOPY;  Service: Endoscopy;  Laterality: N/A;  . INSERTION OF VENA CAVA FILTER N/A 07/16/2012   Procedure: INSERTION OF VENA CAVA FILTER;  Surgeon: [name], MD;  Location: [facility] CATH LAB;  Service: Cardiovascular;  Laterality: N/A;  . KNEE ARTHROSCOPY    . PARTIAL COLECTOMY N/A 06/03/2014   Procedure: PARTIAL COLECTOMY;  Surgeon: Harriette Bouillon, MD;  Location: [facility] OR;  Service: General;  Laterality: N/A;  . REDUCTION MAMMAPLASTY Bilateral   . SP ARTHRO HIP*L*     Allergies  Allergen Reactions  . Augmentin [Amoxicillin-Pot Clavulanate] Itching and Other (See Comments)    Severe vaginal itching  . Tranxene [Clorazepate] Itching  . Penicillins Itching and Rash    Has patient had a PCN reaction causing immediate rash, facial/tongue/throat swelling, SOB or lightheadedness with hypotension: Yes Has patient had a PCN reaction causing severe rash involving mucus membranes or skin necrosis: No Has patient had a PCN reaction that required hospitalization: No Has patient had a PCN reaction occurring within the last 10 years: No If all of the above answers are "NO", then may proceed with Cephalosporin use.    Current Meds  Medication Sig  . acetaminophen (TYLENOL) 500 MG tablet Take 500 mg by mouth every 6 (six) hours as needed for mild pain.   . furosemide (LASIX) 20 MG tablet Take 1 tablet (20 mg total) by mouth daily. (Patient taking differently: Take 20 mg by mouth as needed for fluid or edema. )  . glucose blood (ONETOUCH VERIO) test strip Use as instructed once a day  . glucose blood test strip Use as instructed once a day  . levothyroxine (SYNTHROID) 25 MCG tablet TAKE 1 TABLET BY MOUTH ONCE DAILY BEFORE BREAKFAST  . lisinopril (ZESTRIL) 40 MG tablet Take 1 tablet by mouth once daily  . metFORMIN (GLUCOPHAGE)  1000 MG tablet TAKE ONE TABLET BY MOUTH WITH BREAKFAST AND TAKE ONE TABLET WITH DINNER (Patient taking differently: Take 1,000 mg by mouth 2 (two) times daily with a meal. )  . metoprolol tartrate (LOPRESSOR) 25 MG tablet Take 1 tablet (25 mg total) by mouth 2 (two) times daily.  Letta Pate DELICA LANCETS FINE MISC USE ONCE DAILY.  Marland Kitchen polyethylene glycol (MIRALAX / GLYCOLAX) packet Take 17 g by mouth daily. (Patient taking differently: Take 17 g by mouth every other day. )  . simvastatin (ZOCOR) 20 MG tablet Take 1 tablet (20 mg total) by mouth at bedtime.  . vitamin C (ASCORBIC ACID) 500 MG tablet Take 500 mg by mouth daily.   Marland Kitchen warfarin (COUMADIN) 5 MG tablet Take 5 mg by mouth daily.   . [DISCONTINUED] levothyroxine (SYNTHROID, LEVOTHROID) 25 MCG tablet TAKE 1 TABLET BY MOUTH ONCE DAILY BEFORE BREAKFAST   Social History   Tobacco Use  .  Smoking status: Former Smoker    Packs/day: 1.00    Years: 10.00    Pack years: 10.00    Types: Cigarettes    Last attempt to quit: 01/04/1968    Years since quitting: 50.4  . Smokeless tobacco: Never Used  Substance Use Topics  . Alcohol use: No   Family History  Problem Relation Age of Onset  . Breast cancer Mother      Review of Systems  Constitutional: Positive for activity change (lying in bed since fall on Monday), appetite change (decreased) and fatigue. Negative for chills and fever.  Respiratory: Negative for cough, chest tightness, shortness of breath and wheezing.   Cardiovascular: Negative for chest pain, palpitations and leg swelling.  Musculoskeletal: Positive for arthralgias and joint swelling.  Psychiatric/Behavioral: The patient is not nervous/anxious.     Objective:  There were no vitals taken for this visit.      BP Readings from Last 3 Encounters:  05/21/18 116/64  12/19/17 122/80  12/18/17 137/72   Wt Readings from Last 3 Encounters:  12/19/17 164 lb 6.4 oz (74.6 kg)  12/17/17 172 lb 0.1 oz (78 kg)  12/07/17 172 lb  9.9 oz (78.3 kg)    EXAM:  GENERAL: alert, oriented, appears well and in no acute distress  HEENT: atraumatic, conjunctiva clear, no obvious abnormalities on inspection of external nose and ears  NECK: normal movements of the head and neck  LUNGS: on inspection no signs of respiratory distress, breathing rate appears normal, no obvious gross SOB, gasping or wheezing  CV: no obvious cyanosis  MS: there is edema left knee; no appreciated significant color change via video feed. She has pain with any movement of leg. She is limited to about 10 degree mobility.   PSYCH/NEURO: pleasant and cooperative, no obvious depression or anxiety, speech and thought processing grossly intact  SKIN: no obvious abnormality noted face/neck. There is scarring on bilat knees from previous surgery.   Assessment/Plan  1. Acute pain of left knee Patient was able to get in today with ortho for evaluation. Hydrocodone was given to her to help with pain (she has tolerated this well in the past) we will check in with her on Friday to see how she is doing from the standpoint and to set follow-up. - Ambulatory referral to Orthopedics - DME Wheelchair manual  2. Weakness of both lower extremities Home health and physical therapy already been ordered for her.  I do think at some point in an office evaluation will be important.  We discussed importance of eating higher protein meals to help her rebuild some strength and muscle.  A wheelchair has been ordered for her so that family is able to get her out of bed and get her to any needed appointments. - DME Wheelchair manual Patient suffers from severe knee pain which impairs their ability to perform daily activities like bathing, dressing and feeding in the home. A cane, crutch or walker will not resolve  issue with performing activities of daily living because she is currently unable to get herself up; out of bed; standing. A wheelchair will allow patient to safely  perform daily activities. Patient can safely propel the wheelchair in the home or has a caregiver who can provide assistance.  Accessories: elevating leg rests (ELRs), wheel locks, extensions and anti-tippers.  3. Decreased appetite This seems to be an ongoing issue.  I do feel that appetite will be better as she becomes more active.  We will need to  continue to address this and consider further evaluation if not improving.  4. Type 2 diabetes mellitus with diabetic polyneuropathy, without long-term current use of insulin (HCC) Diabetes has been previously well controlled on metformin alone.  She is due for some repeat blood work.  Plan for blood work when we check in with her on Friday.  It may be possible to have her home health obtain blood to save her visit to the office.  5. Essential hypertension We will need to continue to monitor this.  For now, continue current medications.  6. Chronic diastolic CHF (congestive heart failure) (HCC) Follows with Dr. Tenny Craw.  Uses Lasix as needed swelling.   Return will check in with her in 2 days; due for bloodwork soon.      I discussed the assessment and treatment plan with the patient. The patient was provided an opportunity to ask questions and all were answered. The patient agreed with the plan and demonstrated an understanding of the instructions.   The patient was advised to call back or seek an in-person evaluation if the symptoms worsen or if the condition fails to improve as anticipated.  I provided 33 minutes of non-face-to-face time during this encounter.   Theodis Shove, MD

## 2018-05-24 ENCOUNTER — Telehealth: Payer: Self-pay | Admitting: Family Medicine

## 2018-05-24 NOTE — Telephone Encounter (Signed)
Copied from CRM [id number]. Topic: Quick Communication - See Telephone Encounter >> May 24, 2018  4:26 PM Lorayne Bender wrote: CRM for notification. See Telephone encounter for: 05/24/18.  Angie with [facility] calling.  States that she did get the referral but she needs to have a DX code for pt to get services approved. Angie can be reached at [phone]

## 2018-05-25 ENCOUNTER — Telehealth: Payer: Self-pay | Admitting: *Deleted

## 2018-05-25 ENCOUNTER — Other Ambulatory Visit: Payer: Self-pay | Admitting: Family Medicine

## 2018-05-25 ENCOUNTER — Telehealth: Payer: Self-pay | Admitting: Family Medicine

## 2018-05-25 DIAGNOSIS — S8002XD Contusion of left knee, subsequent encounter: Secondary | ICD-10-CM | POA: Diagnosis not present

## 2018-05-25 DIAGNOSIS — Z7901 Long term (current) use of anticoagulants: Secondary | ICD-10-CM | POA: Diagnosis not present

## 2018-05-25 DIAGNOSIS — I272 Pulmonary hypertension, unspecified: Secondary | ICD-10-CM | POA: Diagnosis not present

## 2018-05-25 DIAGNOSIS — Z96649 Presence of unspecified artificial hip joint: Secondary | ICD-10-CM | POA: Diagnosis not present

## 2018-05-25 DIAGNOSIS — Z433 Encounter for attention to colostomy: Secondary | ICD-10-CM | POA: Diagnosis not present

## 2018-05-25 DIAGNOSIS — Z96652 Presence of left artificial knee joint: Secondary | ICD-10-CM | POA: Diagnosis not present

## 2018-05-25 DIAGNOSIS — W19XXXD Unspecified fall, subsequent encounter: Secondary | ICD-10-CM | POA: Diagnosis not present

## 2018-05-25 DIAGNOSIS — R296 Repeated falls: Secondary | ICD-10-CM | POA: Diagnosis not present

## 2018-05-25 DIAGNOSIS — E1142 Type 2 diabetes mellitus with diabetic polyneuropathy: Secondary | ICD-10-CM | POA: Diagnosis not present

## 2018-05-25 DIAGNOSIS — E039 Hypothyroidism, unspecified: Secondary | ICD-10-CM | POA: Diagnosis not present

## 2018-05-25 DIAGNOSIS — I110 Hypertensive heart disease with heart failure: Secondary | ICD-10-CM | POA: Diagnosis not present

## 2018-05-25 DIAGNOSIS — M1990 Unspecified osteoarthritis, unspecified site: Secondary | ICD-10-CM | POA: Diagnosis not present

## 2018-05-25 DIAGNOSIS — Z86711 Personal history of pulmonary embolism: Secondary | ICD-10-CM | POA: Diagnosis not present

## 2018-05-25 DIAGNOSIS — I5032 Chronic diastolic (congestive) heart failure: Secondary | ICD-10-CM | POA: Diagnosis not present

## 2018-05-25 DIAGNOSIS — Z9181 History of falling: Secondary | ICD-10-CM | POA: Diagnosis not present

## 2018-05-25 NOTE — Telephone Encounter (Signed)
As long as pain has improved with the hydrocodone and brought down to functioning level (I would shoot for 3-5) that is ok. If pain still is 7 after hydrocodone she could take 1.5 tabs as directed.   Can you set up follow up virtual visit for her in about 2 weeks time? This will give [place] chance to go over everything and see progress.   If there is something else I need to do for wheelchair DME order please let me know.

## 2018-05-25 NOTE — Telephone Encounter (Signed)
Angie called back and stated the diagnoses below will not work for coding due to 05-15-18 changes with codes.  She asked if peripheral neuropathy, CHF and osteoarthritis could have contributed to the increased weakness and caused the fall, if so these are covered codes.  Message sent to Dr Hassan Rowan.

## 2018-05-25 NOTE — Telephone Encounter (Signed)
I left a detailed message on Angie's voicemail stating the diagnosis entered by Dr Caryl Never for the referral was due to frequent falls and weakness.

## 2018-05-25 NOTE — Telephone Encounter (Signed)
I called the pts daughter and informed her of the message below and she agreed.  Appt scheduled for 6/5.  Community message sent to [facility] for the wheelchair order that was entered and the pts daughter is aware someone from [facility] will call with more info.

## 2018-05-25 NOTE — Telephone Encounter (Signed)
Copied from CRM [id number]. Topic: Quick Communication - Home Health Verbal Orders >> May 25, 2018  3:55 PM Lorrine Kin, [place] wrote: Caller/Agency: [facility] Callback Number: [phone] Requesting OT/PT/Skilled Nursing/Social Work/Speech Therapy: Skilled Nurse Frequency:  2x a week for 3 weeks 1x a week for 4 weeks As needed for decline or falls  States that patient's blood pressure was a little elevated today with patient's 2 daughter there. States that it was 152/98 on left arm.

## 2018-05-25 NOTE — Telephone Encounter (Signed)
Yes; all of those could and did likely contribute. OK to use those for order.

## 2018-05-25 NOTE — Telephone Encounter (Signed)
I called the pt and her daughter stated she still has some pain.  Stated she was seen by ortho on Wednesday, given a knee brace and is taking Hydrocodone for pain. Stated she has not heard anything about the wheelchair as of today.  Message sent to Dr Hassan Rowan.

## 2018-05-25 NOTE — Telephone Encounter (Signed)
-----   Message from Wynn Banker, MD sent at 05/23/2018 11:37 AM EDT ----- Please check in with them Friday - see how pain is doing; status of ortho, wheelchair status (I put in DME order today)

## 2018-05-25 NOTE — Telephone Encounter (Signed)
I called Angie and informed her of the message below.

## 2018-05-25 NOTE — Addendum Note (Signed)
Addended by: Wynn Banker on: 05/25/2018 07:51 PM   Modules accepted: Orders

## 2018-05-28 DIAGNOSIS — M1990 Unspecified osteoarthritis, unspecified site: Secondary | ICD-10-CM | POA: Diagnosis not present

## 2018-05-28 DIAGNOSIS — Z96649 Presence of unspecified artificial hip joint: Secondary | ICD-10-CM | POA: Diagnosis not present

## 2018-05-28 DIAGNOSIS — R296 Repeated falls: Secondary | ICD-10-CM | POA: Diagnosis not present

## 2018-05-28 DIAGNOSIS — I110 Hypertensive heart disease with heart failure: Secondary | ICD-10-CM | POA: Diagnosis not present

## 2018-05-28 DIAGNOSIS — S8002XD Contusion of left knee, subsequent encounter: Secondary | ICD-10-CM | POA: Diagnosis not present

## 2018-05-28 DIAGNOSIS — I5032 Chronic diastolic (congestive) heart failure: Secondary | ICD-10-CM | POA: Diagnosis not present

## 2018-05-28 DIAGNOSIS — Z433 Encounter for attention to colostomy: Secondary | ICD-10-CM | POA: Diagnosis not present

## 2018-05-28 DIAGNOSIS — Z9181 History of falling: Secondary | ICD-10-CM | POA: Diagnosis not present

## 2018-05-28 DIAGNOSIS — E039 Hypothyroidism, unspecified: Secondary | ICD-10-CM | POA: Diagnosis not present

## 2018-05-28 DIAGNOSIS — Z7901 Long term (current) use of anticoagulants: Secondary | ICD-10-CM | POA: Diagnosis not present

## 2018-05-28 DIAGNOSIS — Z96652 Presence of left artificial knee joint: Secondary | ICD-10-CM | POA: Diagnosis not present

## 2018-05-28 DIAGNOSIS — I272 Pulmonary hypertension, unspecified: Secondary | ICD-10-CM | POA: Diagnosis not present

## 2018-05-28 DIAGNOSIS — E1142 Type 2 diabetes mellitus with diabetic polyneuropathy: Secondary | ICD-10-CM | POA: Diagnosis not present

## 2018-05-28 DIAGNOSIS — W19XXXD Unspecified fall, subsequent encounter: Secondary | ICD-10-CM | POA: Diagnosis not present

## 2018-05-28 DIAGNOSIS — Z86711 Personal history of pulmonary embolism: Secondary | ICD-10-CM | POA: Diagnosis not present

## 2018-05-29 ENCOUNTER — Other Ambulatory Visit: Payer: Self-pay | Admitting: Pharmacist

## 2018-05-29 ENCOUNTER — Telehealth: Payer: Self-pay | Admitting: Family Medicine

## 2018-05-29 MED ORDER — WARFARIN SODIUM 5 MG PO TABS: ORAL_TABLET | ORAL | 0 refills | Status: DC

## 2018-05-29 NOTE — Telephone Encounter (Signed)
Copied from CRM [id number]. Topic: Quick Communication - Home Health Verbal Orders >> May 29, 2018 11:06 AM Maia Petties wrote: Caller/Agency: Gabriel Rung PT with [facility] Callback Number: [phone] secure VM Requesting OT/PT/Skilled Nursing/Social Work/Speech Therapy: PT Frequency: requesting VO for 2x week for 5 weeks for strengthening, safe transfers, gait balance training, home exercise program, and home safety

## 2018-05-29 NOTE — Telephone Encounter (Signed)
Daughter states that she has HH with [facility]   [phone] - [facility] with [facility] and gave verbal for INR on 05/30/18 (next visit in home). She will call to coumadin clinic. In office appt cancelled.

## 2018-05-30 ENCOUNTER — Ambulatory Visit (INDEPENDENT_AMBULATORY_CARE_PROVIDER_SITE_OTHER): Payer: Medicare Other | Admitting: Pharmacist

## 2018-05-30 DIAGNOSIS — S8002XD Contusion of left knee, subsequent encounter: Secondary | ICD-10-CM | POA: Diagnosis not present

## 2018-05-30 DIAGNOSIS — Z96652 Presence of left artificial knee joint: Secondary | ICD-10-CM | POA: Diagnosis not present

## 2018-05-30 DIAGNOSIS — I110 Hypertensive heart disease with heart failure: Secondary | ICD-10-CM | POA: Diagnosis not present

## 2018-05-30 DIAGNOSIS — I272 Pulmonary hypertension, unspecified: Secondary | ICD-10-CM | POA: Diagnosis not present

## 2018-05-30 DIAGNOSIS — Z5181 Encounter for therapeutic drug level monitoring: Secondary | ICD-10-CM | POA: Diagnosis not present

## 2018-05-30 DIAGNOSIS — M1990 Unspecified osteoarthritis, unspecified site: Secondary | ICD-10-CM | POA: Diagnosis not present

## 2018-05-30 DIAGNOSIS — G629 Polyneuropathy, unspecified: Secondary | ICD-10-CM | POA: Diagnosis not present

## 2018-05-30 DIAGNOSIS — W19XXXD Unspecified fall, subsequent encounter: Secondary | ICD-10-CM | POA: Diagnosis not present

## 2018-05-30 DIAGNOSIS — Z433 Encounter for attention to colostomy: Secondary | ICD-10-CM | POA: Diagnosis not present

## 2018-05-30 DIAGNOSIS — E039 Hypothyroidism, unspecified: Secondary | ICD-10-CM | POA: Diagnosis not present

## 2018-05-30 DIAGNOSIS — M25562 Pain in left knee: Secondary | ICD-10-CM | POA: Diagnosis not present

## 2018-05-30 DIAGNOSIS — R296 Repeated falls: Secondary | ICD-10-CM | POA: Diagnosis not present

## 2018-05-30 DIAGNOSIS — Z7901 Long term (current) use of anticoagulants: Secondary | ICD-10-CM | POA: Diagnosis not present

## 2018-05-30 DIAGNOSIS — I5032 Chronic diastolic (congestive) heart failure: Secondary | ICD-10-CM | POA: Diagnosis not present

## 2018-05-30 DIAGNOSIS — Z96649 Presence of unspecified artificial hip joint: Secondary | ICD-10-CM | POA: Diagnosis not present

## 2018-05-30 DIAGNOSIS — E1142 Type 2 diabetes mellitus with diabetic polyneuropathy: Secondary | ICD-10-CM | POA: Diagnosis not present

## 2018-05-30 DIAGNOSIS — Z9181 History of falling: Secondary | ICD-10-CM | POA: Diagnosis not present

## 2018-05-30 DIAGNOSIS — Z86711 Personal history of pulmonary embolism: Secondary | ICD-10-CM | POA: Diagnosis not present

## 2018-05-30 LAB — POCT INR: INR: 3.8 — AB (ref 2.0–3.0)

## 2018-05-30 NOTE — Telephone Encounter (Signed)
Ok to switch home care services.   I agree with keeping track of blood pressures and sugars.

## 2018-05-30 NOTE — Telephone Encounter (Signed)
I called Hannah [name] and informed her of the message below.  She stated she is at someone's home at this time and will call back.

## 2018-05-30 NOTE — Telephone Encounter (Signed)
Hannah [name] called back and stated a nurse went to the home and the pts BP was 130/78 on Monday.  She stated the family did not have her glucometer or BP cuff at home, she made a list of things they need to have and they agreed to do so. Message sent to Dr Hassan Rowan

## 2018-05-30 NOTE — Telephone Encounter (Signed)
Please see how pressure is running now? Do they have cuff to check daily?

## 2018-05-30 NOTE — Telephone Encounter (Signed)
Copied from CRM [id number]. Topic: Quick Communication - See Telephone Encounter >> May 30, 2018 10:50 AM Burchel, Abbi R wrote: CRM for notification. See Telephone encounter for: 05/30/18.  Pt's daughter [name]) called to request that her mother be placed with [facility] instead of [facility].  They are not happy with [facility]'s services.  Please advise.   [phone]

## 2018-05-30 NOTE — Telephone Encounter (Signed)
ok 

## 2018-05-30 NOTE — Telephone Encounter (Signed)
I called Monique and informed her of the verbal orders as approved below.

## 2018-05-31 DIAGNOSIS — Z7901 Long term (current) use of anticoagulants: Secondary | ICD-10-CM | POA: Diagnosis not present

## 2018-05-31 DIAGNOSIS — I110 Hypertensive heart disease with heart failure: Secondary | ICD-10-CM | POA: Diagnosis not present

## 2018-05-31 DIAGNOSIS — R296 Repeated falls: Secondary | ICD-10-CM | POA: Diagnosis not present

## 2018-05-31 DIAGNOSIS — Z86711 Personal history of pulmonary embolism: Secondary | ICD-10-CM | POA: Diagnosis not present

## 2018-05-31 DIAGNOSIS — Z433 Encounter for attention to colostomy: Secondary | ICD-10-CM | POA: Diagnosis not present

## 2018-05-31 DIAGNOSIS — Z9181 History of falling: Secondary | ICD-10-CM | POA: Diagnosis not present

## 2018-05-31 DIAGNOSIS — I272 Pulmonary hypertension, unspecified: Secondary | ICD-10-CM | POA: Diagnosis not present

## 2018-05-31 DIAGNOSIS — E039 Hypothyroidism, unspecified: Secondary | ICD-10-CM | POA: Diagnosis not present

## 2018-05-31 DIAGNOSIS — I5032 Chronic diastolic (congestive) heart failure: Secondary | ICD-10-CM | POA: Diagnosis not present

## 2018-05-31 DIAGNOSIS — Z96652 Presence of left artificial knee joint: Secondary | ICD-10-CM | POA: Diagnosis not present

## 2018-05-31 DIAGNOSIS — E1142 Type 2 diabetes mellitus with diabetic polyneuropathy: Secondary | ICD-10-CM | POA: Diagnosis not present

## 2018-05-31 DIAGNOSIS — M1990 Unspecified osteoarthritis, unspecified site: Secondary | ICD-10-CM | POA: Diagnosis not present

## 2018-05-31 DIAGNOSIS — S8002XD Contusion of left knee, subsequent encounter: Secondary | ICD-10-CM | POA: Diagnosis not present

## 2018-05-31 DIAGNOSIS — W19XXXD Unspecified fall, subsequent encounter: Secondary | ICD-10-CM | POA: Diagnosis not present

## 2018-05-31 DIAGNOSIS — Z96649 Presence of unspecified artificial hip joint: Secondary | ICD-10-CM | POA: Diagnosis not present

## 2018-06-01 DIAGNOSIS — I272 Pulmonary hypertension, unspecified: Secondary | ICD-10-CM | POA: Diagnosis not present

## 2018-06-01 DIAGNOSIS — Z96649 Presence of unspecified artificial hip joint: Secondary | ICD-10-CM | POA: Diagnosis not present

## 2018-06-01 DIAGNOSIS — R296 Repeated falls: Secondary | ICD-10-CM | POA: Diagnosis not present

## 2018-06-01 DIAGNOSIS — I5032 Chronic diastolic (congestive) heart failure: Secondary | ICD-10-CM | POA: Diagnosis not present

## 2018-06-01 DIAGNOSIS — Z7901 Long term (current) use of anticoagulants: Secondary | ICD-10-CM | POA: Diagnosis not present

## 2018-06-01 DIAGNOSIS — Z9181 History of falling: Secondary | ICD-10-CM | POA: Diagnosis not present

## 2018-06-01 DIAGNOSIS — E039 Hypothyroidism, unspecified: Secondary | ICD-10-CM | POA: Diagnosis not present

## 2018-06-01 DIAGNOSIS — M1990 Unspecified osteoarthritis, unspecified site: Secondary | ICD-10-CM | POA: Diagnosis not present

## 2018-06-01 DIAGNOSIS — S8002XD Contusion of left knee, subsequent encounter: Secondary | ICD-10-CM | POA: Diagnosis not present

## 2018-06-01 DIAGNOSIS — E1142 Type 2 diabetes mellitus with diabetic polyneuropathy: Secondary | ICD-10-CM | POA: Diagnosis not present

## 2018-06-01 DIAGNOSIS — W19XXXD Unspecified fall, subsequent encounter: Secondary | ICD-10-CM | POA: Diagnosis not present

## 2018-06-01 DIAGNOSIS — Z433 Encounter for attention to colostomy: Secondary | ICD-10-CM | POA: Diagnosis not present

## 2018-06-01 DIAGNOSIS — Z86711 Personal history of pulmonary embolism: Secondary | ICD-10-CM | POA: Diagnosis not present

## 2018-06-01 DIAGNOSIS — Z96652 Presence of left artificial knee joint: Secondary | ICD-10-CM | POA: Diagnosis not present

## 2018-06-01 DIAGNOSIS — I110 Hypertensive heart disease with heart failure: Secondary | ICD-10-CM | POA: Diagnosis not present

## 2018-06-02 DIAGNOSIS — Z96649 Presence of unspecified artificial hip joint: Secondary | ICD-10-CM | POA: Diagnosis not present

## 2018-06-02 DIAGNOSIS — I272 Pulmonary hypertension, unspecified: Secondary | ICD-10-CM | POA: Diagnosis not present

## 2018-06-02 DIAGNOSIS — W19XXXD Unspecified fall, subsequent encounter: Secondary | ICD-10-CM | POA: Diagnosis not present

## 2018-06-02 DIAGNOSIS — Z9181 History of falling: Secondary | ICD-10-CM | POA: Diagnosis not present

## 2018-06-02 DIAGNOSIS — M1990 Unspecified osteoarthritis, unspecified site: Secondary | ICD-10-CM | POA: Diagnosis not present

## 2018-06-02 DIAGNOSIS — I5032 Chronic diastolic (congestive) heart failure: Secondary | ICD-10-CM | POA: Diagnosis not present

## 2018-06-02 DIAGNOSIS — Z7901 Long term (current) use of anticoagulants: Secondary | ICD-10-CM | POA: Diagnosis not present

## 2018-06-02 DIAGNOSIS — Z86711 Personal history of pulmonary embolism: Secondary | ICD-10-CM | POA: Diagnosis not present

## 2018-06-02 DIAGNOSIS — Z433 Encounter for attention to colostomy: Secondary | ICD-10-CM | POA: Diagnosis not present

## 2018-06-02 DIAGNOSIS — I110 Hypertensive heart disease with heart failure: Secondary | ICD-10-CM | POA: Diagnosis not present

## 2018-06-02 DIAGNOSIS — E1142 Type 2 diabetes mellitus with diabetic polyneuropathy: Secondary | ICD-10-CM | POA: Diagnosis not present

## 2018-06-02 DIAGNOSIS — S8002XD Contusion of left knee, subsequent encounter: Secondary | ICD-10-CM | POA: Diagnosis not present

## 2018-06-02 DIAGNOSIS — E039 Hypothyroidism, unspecified: Secondary | ICD-10-CM | POA: Diagnosis not present

## 2018-06-02 DIAGNOSIS — R296 Repeated falls: Secondary | ICD-10-CM | POA: Diagnosis not present

## 2018-06-02 DIAGNOSIS — Z96652 Presence of left artificial knee joint: Secondary | ICD-10-CM | POA: Diagnosis not present

## 2018-06-04 ENCOUNTER — Telehealth: Payer: Self-pay | Admitting: Family Medicine

## 2018-06-04 NOTE — Telephone Encounter (Signed)
1. Is the freestyle libre meter covered by insurance? If so, then ok with order.  2. Ok for order to change colostomy bags. 3. Ok for PT/INR on weds (this is managed through coumadin clinic and that was their request so future orders should go through them) 4. Please report bp numbers. Last I heard was 130/78 for which I would not increase the metoprolol so more info would be helpful - please give bp report and HR report so we can make more informed decision.   (please let me know if difficulty contacting Olegario Messier? Looks like number for contact not recorded. We may have to go through agency, but I know family recently requested agency change)

## 2018-06-04 NOTE — Telephone Encounter (Signed)
Late Entry- Hannah [name] from home health called asking for multiple orders for patient over the weekend.  She asked that these be sent in a note to pt's PCP to be addressed on Monday  1) She wants pt to have a FreeStyle Libre meter 2) She would like an order to change her colostomy bags 3) she would like an order for PT/INR to be done on Wednesday 4) Pt's BP has been mildly elevated so she wanted PCP to consider increasing Metoprolol to 50mg  daily  No # given as this was a direct transfer from [facility]

## 2018-06-05 DIAGNOSIS — E1142 Type 2 diabetes mellitus with diabetic polyneuropathy: Secondary | ICD-10-CM | POA: Diagnosis not present

## 2018-06-05 DIAGNOSIS — E039 Hypothyroidism, unspecified: Secondary | ICD-10-CM | POA: Diagnosis not present

## 2018-06-05 DIAGNOSIS — W19XXXD Unspecified fall, subsequent encounter: Secondary | ICD-10-CM | POA: Diagnosis not present

## 2018-06-05 DIAGNOSIS — Z96652 Presence of left artificial knee joint: Secondary | ICD-10-CM | POA: Diagnosis not present

## 2018-06-05 DIAGNOSIS — I5032 Chronic diastolic (congestive) heart failure: Secondary | ICD-10-CM | POA: Diagnosis not present

## 2018-06-05 DIAGNOSIS — Z96649 Presence of unspecified artificial hip joint: Secondary | ICD-10-CM | POA: Diagnosis not present

## 2018-06-05 DIAGNOSIS — R296 Repeated falls: Secondary | ICD-10-CM | POA: Diagnosis not present

## 2018-06-05 DIAGNOSIS — I272 Pulmonary hypertension, unspecified: Secondary | ICD-10-CM | POA: Diagnosis not present

## 2018-06-05 DIAGNOSIS — I110 Hypertensive heart disease with heart failure: Secondary | ICD-10-CM | POA: Diagnosis not present

## 2018-06-05 DIAGNOSIS — M1990 Unspecified osteoarthritis, unspecified site: Secondary | ICD-10-CM | POA: Diagnosis not present

## 2018-06-05 DIAGNOSIS — Z7901 Long term (current) use of anticoagulants: Secondary | ICD-10-CM | POA: Diagnosis not present

## 2018-06-05 DIAGNOSIS — S8002XD Contusion of left knee, subsequent encounter: Secondary | ICD-10-CM | POA: Diagnosis not present

## 2018-06-05 DIAGNOSIS — Z433 Encounter for attention to colostomy: Secondary | ICD-10-CM | POA: Diagnosis not present

## 2018-06-05 DIAGNOSIS — Z9181 History of falling: Secondary | ICD-10-CM | POA: Diagnosis not present

## 2018-06-05 DIAGNOSIS — Z86711 Personal history of pulmonary embolism: Secondary | ICD-10-CM | POA: Diagnosis not present

## 2018-06-05 NOTE — Telephone Encounter (Signed)
I called [facility] at [phone] and spoke with Joni Reining.  Joni Reining stated she will send a message to Olegario Messier to return my call as she is out in the field at this time seeing pts.

## 2018-06-05 NOTE — Telephone Encounter (Signed)
Community message sent to [facility]-attn: Peggye Fothergill to request transfer of services.

## 2018-06-06 ENCOUNTER — Ambulatory Visit (INDEPENDENT_AMBULATORY_CARE_PROVIDER_SITE_OTHER): Payer: Medicare Other | Admitting: Cardiovascular Disease

## 2018-06-06 ENCOUNTER — Telehealth: Payer: Self-pay | Admitting: Family Medicine

## 2018-06-06 DIAGNOSIS — Z5181 Encounter for therapeutic drug level monitoring: Secondary | ICD-10-CM

## 2018-06-06 DIAGNOSIS — W19XXXD Unspecified fall, subsequent encounter: Secondary | ICD-10-CM | POA: Diagnosis not present

## 2018-06-06 DIAGNOSIS — I272 Pulmonary hypertension, unspecified: Secondary | ICD-10-CM | POA: Diagnosis not present

## 2018-06-06 DIAGNOSIS — M1990 Unspecified osteoarthritis, unspecified site: Secondary | ICD-10-CM | POA: Diagnosis not present

## 2018-06-06 DIAGNOSIS — E1142 Type 2 diabetes mellitus with diabetic polyneuropathy: Secondary | ICD-10-CM | POA: Diagnosis not present

## 2018-06-06 DIAGNOSIS — Z96652 Presence of left artificial knee joint: Secondary | ICD-10-CM | POA: Diagnosis not present

## 2018-06-06 DIAGNOSIS — E039 Hypothyroidism, unspecified: Secondary | ICD-10-CM | POA: Diagnosis not present

## 2018-06-06 DIAGNOSIS — Z433 Encounter for attention to colostomy: Secondary | ICD-10-CM | POA: Diagnosis not present

## 2018-06-06 DIAGNOSIS — I5032 Chronic diastolic (congestive) heart failure: Secondary | ICD-10-CM | POA: Diagnosis not present

## 2018-06-06 DIAGNOSIS — Z7901 Long term (current) use of anticoagulants: Secondary | ICD-10-CM | POA: Diagnosis not present

## 2018-06-06 DIAGNOSIS — Z9181 History of falling: Secondary | ICD-10-CM | POA: Diagnosis not present

## 2018-06-06 DIAGNOSIS — R296 Repeated falls: Secondary | ICD-10-CM | POA: Diagnosis not present

## 2018-06-06 DIAGNOSIS — I110 Hypertensive heart disease with heart failure: Secondary | ICD-10-CM | POA: Diagnosis not present

## 2018-06-06 DIAGNOSIS — S8002XD Contusion of left knee, subsequent encounter: Secondary | ICD-10-CM | POA: Diagnosis not present

## 2018-06-06 DIAGNOSIS — Z86711 Personal history of pulmonary embolism: Secondary | ICD-10-CM | POA: Diagnosis not present

## 2018-06-06 DIAGNOSIS — Z96649 Presence of unspecified artificial hip joint: Secondary | ICD-10-CM | POA: Diagnosis not present

## 2018-06-06 LAB — POCT INR: INR: 4 — AB (ref 2.0–3.0)

## 2018-06-06 NOTE — Telephone Encounter (Signed)
Copied from CRM [id number]. Topic: Quick Communication - Rx Refill/Question >> Jun 06, 2018 11:49 AM Randol Kern wrote: Medication: New meter request pt's daughter reports that pt has not been testing blood sugar, meter is broken  Has the patient contacted their pharmacy? Yes  (Agent: If no, request that the patient contact the pharmacy for the refill.) (Agent: If yes, when and what did the pharmacy advise?)  Preferred Pharmacy (with phone number or street name): [facility] [address] [place], [place] - [address] [place] [address] Phone: [phone] Fax: [phone]    Agent: Please be advised that RX refills may take up to 3 business days. We ask that you follow-up with your pharmacy.

## 2018-06-06 NOTE — Patient Instructions (Signed)
Description   Spoke with Albesa Seen [facility] RN and [name] pt's dtr (on [place]) and instructed pt to skip dosage today then change dose to 1/2 tablet daily except 1 tablet on Sundays and Tuesdays.  Recheck in 1 week. Please continue normal dark green leafy intake weekly.  Call with any new meds or procedures [phone]

## 2018-06-06 NOTE — Telephone Encounter (Signed)
Copied from CRM [id number]. Topic: Quick Communication - Home Health Verbal Orders >> Jun 06, 2018 10:16 AM Marylen Ponto wrote: Caller/Agency: [name] with [facility] Number: [phone] Requesting OT/PT/Skilled Nursing/Social Work/Speech Therapy: OT Frequency: 1 time a week for 2 weeks and 2 times a week for 4 weeks

## 2018-06-06 NOTE — Telephone Encounter (Signed)
I called [name] at the number below and informed her the pt has decided to use a different agency. She stated she will pass the message along.

## 2018-06-07 ENCOUNTER — Telehealth: Payer: Self-pay | Admitting: Family Medicine

## 2018-06-07 ENCOUNTER — Telehealth: Payer: Self-pay | Admitting: *Deleted

## 2018-06-07 DIAGNOSIS — Z9181 History of falling: Secondary | ICD-10-CM | POA: Diagnosis not present

## 2018-06-07 DIAGNOSIS — I5032 Chronic diastolic (congestive) heart failure: Secondary | ICD-10-CM | POA: Diagnosis not present

## 2018-06-07 DIAGNOSIS — R296 Repeated falls: Secondary | ICD-10-CM | POA: Diagnosis not present

## 2018-06-07 DIAGNOSIS — Z433 Encounter for attention to colostomy: Secondary | ICD-10-CM | POA: Diagnosis not present

## 2018-06-07 DIAGNOSIS — I272 Pulmonary hypertension, unspecified: Secondary | ICD-10-CM | POA: Diagnosis not present

## 2018-06-07 DIAGNOSIS — E1142 Type 2 diabetes mellitus with diabetic polyneuropathy: Secondary | ICD-10-CM | POA: Diagnosis not present

## 2018-06-07 DIAGNOSIS — Z86711 Personal history of pulmonary embolism: Secondary | ICD-10-CM | POA: Diagnosis not present

## 2018-06-07 DIAGNOSIS — M1990 Unspecified osteoarthritis, unspecified site: Secondary | ICD-10-CM | POA: Diagnosis not present

## 2018-06-07 DIAGNOSIS — Z96652 Presence of left artificial knee joint: Secondary | ICD-10-CM | POA: Diagnosis not present

## 2018-06-07 DIAGNOSIS — Z96649 Presence of unspecified artificial hip joint: Secondary | ICD-10-CM | POA: Diagnosis not present

## 2018-06-07 DIAGNOSIS — I110 Hypertensive heart disease with heart failure: Secondary | ICD-10-CM | POA: Diagnosis not present

## 2018-06-07 DIAGNOSIS — S8002XD Contusion of left knee, subsequent encounter: Secondary | ICD-10-CM | POA: Diagnosis not present

## 2018-06-07 DIAGNOSIS — W19XXXD Unspecified fall, subsequent encounter: Secondary | ICD-10-CM | POA: Diagnosis not present

## 2018-06-07 DIAGNOSIS — Z7901 Long term (current) use of anticoagulants: Secondary | ICD-10-CM | POA: Diagnosis not present

## 2018-06-07 DIAGNOSIS — E039 Hypothyroidism, unspecified: Secondary | ICD-10-CM | POA: Diagnosis not present

## 2018-06-07 MED ORDER — ONETOUCH VERIO W/DEVICE KIT: 1.0000 | PACK | 0 refills | Status: DC

## 2018-06-07 NOTE — Telephone Encounter (Signed)
Rx for glucometer sent to the pts pharmacy.

## 2018-06-07 NOTE — Telephone Encounter (Signed)
Copied from CRM [id number]. Topic: Quick Communication - Home Health Verbal Orders >> Jun 07, 2018 10:35 AM Fanny Bien wrote: Caller/Agency: [facility]  Callback Number: [phone] Requesting OT Frequency: 1x2 2x4

## 2018-06-07 NOTE — Telephone Encounter (Signed)
   Message forwarded to Dr Hassan Rowan. Arminda Resides; Johnella Moloney, CMA; Sheron Nightingale; Cindra Eves        Caryl Ada,   Our staff has tried to see Ms. Vanderhyde and her daughter [name] told the nurse today they did not want our services and they were staying with the other agency. We just wanted you to know we are not going to see her at the request of her daughter unless you tell us anything different.   Thank you!    Previous Messages    ----- Message -----  From: Peggye Fothergill  Sent: 06/07/2018  9:48 AM EDT  To: Sheron Nightingale, Cindra Eves, *  Subject: RE: Home health                  Thank you Alvino Chapel!   ----- Message -----  From: Johnella Moloney, CMA  Sent: 06/07/2018  8:13 AM EDT  To: Sheron Nightingale, Cindra Eves, Peggye Fothergill  Subject: RE: Home health                  Good morning,  I spoke with Michelle,PT with [facility] and informed her the pt requested a different agency and she stated she will relay this message. Have a good day!  Narda Amber   ----- Message -----  From: Peggye Fothergill  Sent: 06/05/2018 11:49 AM EDT  To: Sheron Nightingale, Cindra Eves, *  Subject: RE: Home health                  Good morning Alvino Chapel   Thank you! This referral has been reviewed and was accepted for [place] to see the pt. Do you know if [facility] has closed her yet?   We cannot start service without them closing her first per insurance guidelines.   Also so we can reply to messages quicker for home health please remember to send to all 3 of [place] Sheron Nightingale, Cindra Eves and myself).   Thanks  Angie   ----- Message -----  From: Johnella Moloney, CMA  Sent: 06/05/2018 10:46 AM EDT  To: Peggye Fothergill  Subject: Home health                    Good morning!  I told you I would need to send another referral to you soon and here it is! This patient requests to switch from [place]. Can you help with  this please?  Ronnald Collum

## 2018-06-08 ENCOUNTER — Other Ambulatory Visit: Payer: Self-pay

## 2018-06-08 ENCOUNTER — Ambulatory Visit (INDEPENDENT_AMBULATORY_CARE_PROVIDER_SITE_OTHER): Payer: Medicare Other | Admitting: Family Medicine

## 2018-06-08 ENCOUNTER — Encounter: Payer: Self-pay | Admitting: Family Medicine

## 2018-06-08 ENCOUNTER — Telehealth: Payer: Self-pay | Admitting: Family Medicine

## 2018-06-08 DIAGNOSIS — I5032 Chronic diastolic (congestive) heart failure: Secondary | ICD-10-CM | POA: Diagnosis not present

## 2018-06-08 DIAGNOSIS — E038 Other specified hypothyroidism: Secondary | ICD-10-CM

## 2018-06-08 DIAGNOSIS — S8002XD Contusion of left knee, subsequent encounter: Secondary | ICD-10-CM | POA: Diagnosis not present

## 2018-06-08 DIAGNOSIS — E039 Hypothyroidism, unspecified: Secondary | ICD-10-CM | POA: Diagnosis not present

## 2018-06-08 DIAGNOSIS — W19XXXD Unspecified fall, subsequent encounter: Secondary | ICD-10-CM | POA: Diagnosis not present

## 2018-06-08 DIAGNOSIS — R531 Weakness: Secondary | ICD-10-CM | POA: Diagnosis not present

## 2018-06-08 DIAGNOSIS — Z9181 History of falling: Secondary | ICD-10-CM | POA: Diagnosis not present

## 2018-06-08 DIAGNOSIS — I10 Essential (primary) hypertension: Secondary | ICD-10-CM | POA: Diagnosis not present

## 2018-06-08 DIAGNOSIS — Z96649 Presence of unspecified artificial hip joint: Secondary | ICD-10-CM | POA: Diagnosis not present

## 2018-06-08 DIAGNOSIS — Z96652 Presence of left artificial knee joint: Secondary | ICD-10-CM | POA: Diagnosis not present

## 2018-06-08 DIAGNOSIS — M1990 Unspecified osteoarthritis, unspecified site: Secondary | ICD-10-CM | POA: Diagnosis not present

## 2018-06-08 DIAGNOSIS — R296 Repeated falls: Secondary | ICD-10-CM | POA: Diagnosis not present

## 2018-06-08 DIAGNOSIS — D649 Anemia, unspecified: Secondary | ICD-10-CM

## 2018-06-08 DIAGNOSIS — E1142 Type 2 diabetes mellitus with diabetic polyneuropathy: Secondary | ICD-10-CM | POA: Diagnosis not present

## 2018-06-08 DIAGNOSIS — Z86711 Personal history of pulmonary embolism: Secondary | ICD-10-CM | POA: Diagnosis not present

## 2018-06-08 DIAGNOSIS — E785 Hyperlipidemia, unspecified: Secondary | ICD-10-CM

## 2018-06-08 DIAGNOSIS — G629 Polyneuropathy, unspecified: Secondary | ICD-10-CM

## 2018-06-08 DIAGNOSIS — E1169 Type 2 diabetes mellitus with other specified complication: Secondary | ICD-10-CM

## 2018-06-08 DIAGNOSIS — Z7901 Long term (current) use of anticoagulants: Secondary | ICD-10-CM | POA: Diagnosis not present

## 2018-06-08 DIAGNOSIS — I272 Pulmonary hypertension, unspecified: Secondary | ICD-10-CM | POA: Diagnosis not present

## 2018-06-08 DIAGNOSIS — Z433 Encounter for attention to colostomy: Secondary | ICD-10-CM | POA: Diagnosis not present

## 2018-06-08 DIAGNOSIS — I110 Hypertensive heart disease with heart failure: Secondary | ICD-10-CM | POA: Diagnosis not present

## 2018-06-08 MED ORDER — METOPROLOL TARTRATE 25 MG PO TABS: 25.0000 mg | ORAL_TABLET | Freq: Two times a day (BID) | ORAL | 1 refills | Status: DC

## 2018-06-08 MED ORDER — METFORMIN HCL 1000 MG PO TABS: ORAL_TABLET | ORAL | 1 refills | Status: DC

## 2018-06-08 NOTE — Telephone Encounter (Signed)
Copied from CRM [id number]. Topic: Quick Communication - Home Health Verbal Orders >> Jun 08, 2018  2:36 PM Jaquita Rector A wrote: Caller/Agency: [facility]  Callback Number: [phone] ok to lm Requesting OT/PT/Skilled Nursing/Social Work/Speech Therapy: Social work evaluation Frequency: Asking to begin next week

## 2018-06-08 NOTE — Progress Notes (Signed)
Virtual Visit via Video Note  I connected with Hannah [name]  on 06/08/18 at 11:00 AM EDT by a video enabled telemedicine application and verified that I am speaking with the correct person using two identifiers.  Location patient: home Location provider:work or home office Persons participating in the virtual visit: patient, provider  I discussed the limitations of evaluation and management by telemedicine and the availability of in person appointments. The patient expressed understanding and agreed to proceed.  Video visit was not working, so visit was completed by telephone.  Hannah [name] DOB: [birth year] Encounter date: 06/08/2018  This is a 78 y.o. female who presents with No chief complaint on file.  Last visit was 5/20 (virtual); at that time patient was bed-ridden due to left knee pain s/p fall and ER visit. Wanted short term follow up for problems with appetite, knee pain/weakness, blood pressure (some reports of elevation from home nursing)  History of present illness: Still having some pain, but able to take tylenol to keep at [facility].  She is moving around a little more; she is not needing complete assistance of family - even going into bathroom on her own. She is using walker to get around house.  Yesterday PT was here and she did most yesterday since beginning of this.  Eating is still a little less than normal; but is picking up some.  Mood is ok.  Blood pressure is being checked when assistant comes to house. Home cuff was inaccurate. Daughter states that bp today was 122/60. Highest that they have seen was 152/90's. Each time nurse comes out it is different nurse and different cuff.  Nurse has been checking weight. Scale they have been using was 149. Daughter got own scale and yesterday and today got 158.   Her hands are shaking for last couple of months. Not sure why this is happening. Shaking is more with activity like eating/writing. Happens with right hand most; not happening  with left. No weakness, no headaches, no vision changes.   Daughter called earlier this week regarding diabetic meter. Pharmacy has this for her now.   Currently using [facility]. [facility] liked [facility] in past, but they are doing ok with [facility]. Getting PT/OT twice weekly, nursing 1-2x/week.   Daughter may need Marcy Salvo) paperwork for FMLA to help with mom's care.    Allergies  Allergen Reactions  . Augmentin [Amoxicillin-Pot Clavulanate] Itching and Other (See Comments)    Severe vaginal itching  . Tranxene [Clorazepate] Itching  . Penicillins Itching and Rash    Has patient had a PCN reaction causing immediate rash, facial/tongue/throat swelling, SOB or lightheadedness with hypotension: Yes Has patient had a PCN reaction causing severe rash involving mucus membranes or skin necrosis: No Has patient had a PCN reaction that required hospitalization: No Has patient had a PCN reaction occurring within the last 10 years: No If all of the above answers are "NO", then may proceed with Cephalosporin use.    Current Meds  Medication Sig  . acetaminophen (TYLENOL) 500 MG tablet Take 500 mg by mouth every 6 (six) hours as needed for mild pain.   . Blood Glucose Monitoring Suppl (ONETOUCH VERIO) w/Device KIT 1 each by Does not apply route as directed.  . furosemide (LASIX) 20 MG tablet Take 1 tablet (20 mg total) by mouth daily. (Patient taking differently: Take 20 mg by mouth as needed for fluid or edema. )  . glucose blood (ONETOUCH VERIO) test strip Use as  instructed once a day  . glucose blood test strip Use as instructed once a day  . HYDROcodone-acetaminophen (NORCO) 5-325 MG tablet Take 1 tablet by mouth 3 (three) times daily as needed for severe pain.  Marland Kitchen levothyroxine (SYNTHROID) 25 MCG tablet TAKE 1 TABLET BY MOUTH ONCE DAILY BEFORE BREAKFAST  . lisinopril (ZESTRIL) 40 MG tablet Take 1 tablet by mouth once daily  . metFORMIN (GLUCOPHAGE)  1000 MG tablet TAKE ONE TABLET BY MOUTH WITH BREAKFAST AND TAKE ONE TABLET WITH DINNER  . metoprolol tartrate (LOPRESSOR) 25 MG tablet Take 1 tablet (25 mg total) by mouth 2 (two) times daily.  [name] DELICA LANCETS FINE MISC USE ONCE DAILY.  Marland Kitchen polyethylene glycol (MIRALAX / GLYCOLAX) packet Take 17 g by mouth daily. (Patient taking differently: Take 17 g by mouth every other day. )  . simvastatin (ZOCOR) 20 MG tablet Take 1 tablet (20 mg total) by mouth at bedtime.  . vitamin C (ASCORBIC ACID) 500 MG tablet Take 500 mg by mouth daily.   Marland Kitchen warfarin (COUMADIN) 5 MG tablet Take 1/2 or 1 tablet daily as directed by Coumadin clinic  . [DISCONTINUED] metFORMIN (GLUCOPHAGE) 1000 MG tablet TAKE ONE TABLET BY MOUTH WITH BREAKFAST AND TAKE ONE TABLET WITH DINNER (Patient taking differently: Take 1,000 mg by mouth 2 (two) times daily with a meal. )  . [DISCONTINUED] metoprolol tartrate (LOPRESSOR) 25 MG tablet Take 1 tablet (25 mg total) by mouth 2 (two) times daily.    Review of Systems  Constitutional: Negative for chills, fatigue and fever.  Respiratory: Negative for cough, chest tightness, shortness of breath and wheezing.   Cardiovascular: Negative for chest pain, palpitations and leg swelling.  Gastrointestinal:       Appetite is still poor   Musculoskeletal: Positive for arthralgias.  Neurological: Positive for weakness (still weak, but doing much better with getting around and decreased pain has helped her with ambulation).    Objective:  There were no vitals taken for this visit.      BP Readings from Last 3 Encounters:  05/21/18 116/64  12/19/17 122/80  12/18/17 137/72   Wt Readings from Last 3 Encounters:  12/19/17 164 lb 6.4 oz (74.6 kg)  12/17/17 172 lb 0.1 oz (78 kg)  12/07/17 172 lb 9.9 oz (78.3 kg)    EXAM:  GENERAL: alert, oriented, sounds well and in no acute distress.  PSYCH/NEURO: pleasant and cooperative, no obvious depression or anxiety, speech and thought  processing grossly intact; answering questions through daughter's phone.   Assessment/Plan  1. Essential hypertension There was some concerns for elevated blood pressure, but most recent is within normal.  I discussed that it is important to continue to monitor blood pressure and not overtreat because I do not want her getting hypotensive which would increase her fall risk at home.  We did discuss that if blood pressure elevates, increasing the metoprolol may be helpful to also aid in slowing down but sounds like a benign action tremor.- metoprolol tartrate (LOPRESSOR) 25 MG tablet; Take 1 tablet (25 mg total) by mouth 2 (two) times daily.  Dispense: 180 tablet; Refill: 1 - Comprehensive metabolic panel; Future - CBC with Differential/Platelet; Future  2. Type 2 diabetes mellitus with diabetic polyneuropathy, without long-term current use of insulin (HCC) Has been well controlled historically.  They are checking multiple times a day.  I do not feel like this is necessary.  Advised to check home daily but okay with checking just a couple of times  a week and if not feeling well.  We will get a recheck on A1c. - metFORMIN (GLUCOPHAGE) 1000 MG tablet; TAKE ONE TABLET BY MOUTH WITH BREAKFAST AND TAKE ONE TABLET WITH DINNER  Dispense: 180 tablet; Refill: 1 - Microalbumin / creatinine urine ratio; Future - Hemoglobin A1c; Future  3. Other specified hypothyroidism Continue Synthroid.  Will recheck thyroid function. - TSH; Future  4. Neuropathy I do not see recent B12.  With weakness, poor appetite feel this is important to check. - Vitamin B12; Future  5. Weakness The above.  She is currently getting home physical therapy and Occupational Therapy.  She has nursing helping with bathing in house.  She is able to get to bathroom using a walker now. - Vitamin B12; Future  6. Anemia, unspecified type - Vitamin B12; Future  7. Hyperlipidemia associated with type 2 diabetes mellitus (HCC) Continue  Zocor. - Lipid panel; Future    Return pending bloodwork.   I discussed the assessment and treatment plan with the patient. The patient was provided an opportunity to ask questions and all were answered. The patient agreed with the plan and demonstrated an understanding of the instructions.   The patient was advised to call back or seek an in-person evaluation if the symptoms worsen or if the condition fails to improve as anticipated.  I provided 20 minutes of non-face-to-face time during this encounter.   Micheline Rough, MD

## 2018-06-08 NOTE — Telephone Encounter (Signed)
Noted  

## 2018-06-08 NOTE — Telephone Encounter (Signed)
ok 

## 2018-06-08 NOTE — Telephone Encounter (Signed)
I called Hannah [name] and informed her of the verbal orders as below. 

## 2018-06-08 NOTE — Telephone Encounter (Signed)
I spoke with the pts daughter and she stated she will continue to use [facility] as initially the pt wanted [facility].  Message sent to Dr Hassan Rowan as Lorain Childes.

## 2018-06-08 NOTE — Telephone Encounter (Signed)
OK; please just verify with family that their needs are being met at this time.

## 2018-06-11 ENCOUNTER — Other Ambulatory Visit: Payer: Self-pay

## 2018-06-11 ENCOUNTER — Telehealth (INDEPENDENT_AMBULATORY_CARE_PROVIDER_SITE_OTHER): Payer: Medicare Other | Admitting: Cardiology

## 2018-06-11 ENCOUNTER — Telehealth: Payer: Self-pay | Admitting: *Deleted

## 2018-06-11 ENCOUNTER — Encounter: Payer: Self-pay | Admitting: Cardiology

## 2018-06-11 DIAGNOSIS — I10 Essential (primary) hypertension: Secondary | ICD-10-CM

## 2018-06-11 DIAGNOSIS — I5032 Chronic diastolic (congestive) heart failure: Secondary | ICD-10-CM | POA: Diagnosis not present

## 2018-06-11 DIAGNOSIS — Z96652 Presence of left artificial knee joint: Secondary | ICD-10-CM | POA: Diagnosis not present

## 2018-06-11 DIAGNOSIS — Z433 Encounter for attention to colostomy: Secondary | ICD-10-CM | POA: Diagnosis not present

## 2018-06-11 DIAGNOSIS — E785 Hyperlipidemia, unspecified: Secondary | ICD-10-CM

## 2018-06-11 DIAGNOSIS — Z86711 Personal history of pulmonary embolism: Secondary | ICD-10-CM | POA: Diagnosis not present

## 2018-06-11 DIAGNOSIS — E039 Hypothyroidism, unspecified: Secondary | ICD-10-CM | POA: Diagnosis not present

## 2018-06-11 DIAGNOSIS — E1142 Type 2 diabetes mellitus with diabetic polyneuropathy: Secondary | ICD-10-CM | POA: Diagnosis not present

## 2018-06-11 DIAGNOSIS — R296 Repeated falls: Secondary | ICD-10-CM | POA: Diagnosis not present

## 2018-06-11 DIAGNOSIS — Z7901 Long term (current) use of anticoagulants: Secondary | ICD-10-CM

## 2018-06-11 DIAGNOSIS — W19XXXD Unspecified fall, subsequent encounter: Secondary | ICD-10-CM | POA: Diagnosis not present

## 2018-06-11 DIAGNOSIS — M1990 Unspecified osteoarthritis, unspecified site: Secondary | ICD-10-CM | POA: Diagnosis not present

## 2018-06-11 DIAGNOSIS — Z9181 History of falling: Secondary | ICD-10-CM | POA: Diagnosis not present

## 2018-06-11 DIAGNOSIS — I110 Hypertensive heart disease with heart failure: Secondary | ICD-10-CM | POA: Diagnosis not present

## 2018-06-11 DIAGNOSIS — Z96649 Presence of unspecified artificial hip joint: Secondary | ICD-10-CM | POA: Diagnosis not present

## 2018-06-11 DIAGNOSIS — S8002XD Contusion of left knee, subsequent encounter: Secondary | ICD-10-CM | POA: Diagnosis not present

## 2018-06-11 DIAGNOSIS — I272 Pulmonary hypertension, unspecified: Secondary | ICD-10-CM | POA: Diagnosis not present

## 2018-06-11 NOTE — Telephone Encounter (Signed)
I called [facility] at [phone] and spoke with Caryl Pina.  She was given the lab tests that were ordered by Dr Ethlyn Gallery on 6/5 and stated a nurse will go to the pts home tomorrow to obtain these.

## 2018-06-11 NOTE — Telephone Encounter (Signed)
I called Shanon Brow and left a detailed message with the orders as below.

## 2018-06-11 NOTE — Patient Instructions (Signed)
Medication Instructions:  Your physician recommends that you continue on your current medications as directed. Please refer to the Current Medication list given to you today.  If you need a refill on your cardiac medications before your next appointment, please call your pharmacy.   Lab work: None   If you have labs (blood work) drawn today and your tests are completely normal, you will receive your results only by: . MyChart Message (if you have MyChart) OR . A paper copy in the mail If you have any lab test that is abnormal or we need to change your treatment, we will call you to review the results.  Testing/Procedures: None   Follow-Up: At [facility], you and your health needs are our priority.  As part of our continuing mission to provide you with exceptional heart care, we have created designated Provider Care Teams.  These Care Teams include your primary Cardiologist (physician) and Advanced Practice Providers (APPs -  Physician Assistants and Nurse Practitioners) who all work together to provide you with the care you need, when you need it. You will need a follow up appointment in:  12 months.  Please call our office 2 months in advance to schedule this appointment.  You may see Paula Ross, MD or one of the following Advanced Practice Providers on your designated Care Team: Scott Weaver, PA-C Vin Bhagat, PA-C . Janine Hammond, NP  Any Other Special Instructions Will Be Listed Below (If Applicable).   

## 2018-06-11 NOTE — Progress Notes (Signed)
Virtual Visit via Video Note   This visit type was conducted due to national recommendations for restrictions regarding the COVID-19 Pandemic (e.g. social distancing) in an effort to limit this patient's exposure and mitigate transmission in our community.  Due to her co-morbid illnesses, this patient is at least at moderate risk for complications without adequate follow up.  This format is felt to be most appropriate for this patient at this time.  All issues noted in this document were discussed and addressed.  A limited physical exam was performed with this format.  Please refer to the patient's chart for her consent to telehealth for [facility].   Date:  06/11/2018   ID:  Hannah [name], DOB 1940/06/11, MRN [id number]  Patient Location: Home Provider Location: Home  PCP:  [name], MD  Cardiologist:  Dorris Carnes, MD  Electrophysiologist:  None   Evaluation Performed:  Follow-Up Visit  Chief Complaint:  Hypertension  History of Present Illness:    Hannah [name] is a 78 y.o. female with past medical history of PE on life long Coumadin, type 2 DM, hypertension, Hyperlipidemia, hypothyroidism and large bowel stricture s/p colectomy with colostomy.   She was last seen in our office by Dr. Harrington Challenger on 03/02/2017 and was doing well except for occ swelling. She was given Rx for as needed lasix and potassium. BP was elevated at office, but pt reported normal readings at home.   She was hospitalized in 12/2017 for treatment of hematoma and overlying cellulitis of the left breast. She had blood loss anemia with Hg dropped from 11.5 to 8.8.  INR was supra therapeutic at 3.48. Her coumadin was held for 2 weeks and resumed.    She has had several falls with recent ED visit for fall on 05/21/18. There were no acute findings with labs or xrays. She has been noted to have increased generalized weakness. She was ordered home health PT, OT and nursing per her PCP.   Hannah [name] is seen today for  annual follow up that was initially deferred d/t COVID pandemic. Her daughter is present also. OT was there today and gave her some exercises to do and some utensils to use. PT is helping with walking with walker and exercises. She is starting to feel stronger. No falls since 5/18.   No dizziness. Daughter states she does not think pt fell due to dizziness. She usually falls due to mechanical issues/weakness. Pt denies chest pain/pressure, shortness of breath, orthopnea, PND, edema. She has rare feet swelling and using lasix with improvement.   The patient does not have symptoms concerning for COVID-19 infection (fever, chills, cough, or new shortness of breath).    Past Medical History:  Diagnosis Date  . Acute massive pulmonary embolism ([place]) 07/13/2012   Massive PE w/ PEA arrest 07/13/12 >TNK >IVC filter >discharged on comadin    . Diabetes mellitus, type 2 ([place]) 07/15/2012   with peripheral neuropathy  . Hyperlipemia 07/14/2012  . Hypertension 07/14/2012  . Large bowel stricture ([facility])    s/p colectomy in 2016  . Osteoarthritis    s/p hip and knee replacements  . Thyroid disease    Past Surgical History:  Procedure Laterality Date  . COLONOSCOPY N/A 03/12/2016   Procedure: COLONOSCOPY;  Surgeon: Teena Irani, MD;  Location: [facility] ENDOSCOPY;  Service: Endoscopy;  Laterality: N/A;  . COLOSTOMY N/A 06/03/2014   Procedure: COLOSTOMY;  Surgeon: Erroll Luna, MD;  Location: [place];  Service: General;  Laterality: N/A;  .  FLEXIBLE SIGMOIDOSCOPY N/A 05/30/2014   Procedure: FLEXIBLE SIGMOIDOSCOPY;  Surgeon: Carol Ada, MD;  Location: [facility] ENDOSCOPY;  Service: Endoscopy;  Laterality: N/A;  . INSERTION OF VENA CAVA FILTER N/A 07/16/2012   Procedure: INSERTION OF VENA CAVA FILTER;  Surgeon: Elam Dutch, MD;  Location: [facility] CATH LAB;  Service: Cardiovascular;  Laterality: N/A;  . KNEE ARTHROSCOPY    . PARTIAL COLECTOMY N/A 06/03/2014   Procedure: PARTIAL COLECTOMY;  Surgeon: Erroll Luna, MD;   Location: [place];  Service: General;  Laterality: N/A;  . REDUCTION MAMMAPLASTY Bilateral   . SP ARTHRO HIP*L*       Current Meds  Medication Sig  . acetaminophen (TYLENOL) 500 MG tablet Take 500 mg by mouth every 6 (six) hours as needed for mild pain.   . Blood Glucose Monitoring Suppl (ONETOUCH VERIO) w/Device KIT 1 each by Does not apply route as directed.  . furosemide (LASIX) 20 MG tablet Take 1 tablet (20 mg total) by mouth daily. (Patient taking differently: Take 20 mg by mouth as needed for fluid or edema. )  . glucose blood (ONETOUCH VERIO) test strip Use as instructed once a day  . glucose blood test strip Use as instructed once a day  . levothyroxine (SYNTHROID) 25 MCG tablet TAKE 1 TABLET BY MOUTH ONCE DAILY BEFORE BREAKFAST  . lisinopril (ZESTRIL) 40 MG tablet Take 1 tablet by mouth once daily  . metFORMIN (GLUCOPHAGE) 1000 MG tablet TAKE ONE TABLET BY MOUTH WITH BREAKFAST AND TAKE ONE TABLET WITH DINNER  . metoprolol tartrate (LOPRESSOR) 25 MG tablet Take 1 tablet (25 mg total) by mouth 2 (two) times daily.  [name] DELICA LANCETS FINE MISC USE ONCE DAILY.  Marland Kitchen polyethylene glycol (MIRALAX / GLYCOLAX) packet Take 17 g by mouth daily. (Patient taking differently: Take 17 g by mouth every other day. )  . simvastatin (ZOCOR) 20 MG tablet Take 1 tablet (20 mg total) by mouth at bedtime.  . vitamin C (ASCORBIC ACID) 500 MG tablet Take 500 mg by mouth daily.   Marland Kitchen warfarin (COUMADIN) 5 MG tablet Take 1/2 or 1 tablet daily as directed by Coumadin clinic     Allergies:   Augmentin [amoxicillin-pot clavulanate]; Tranxene [clorazepate]; and Penicillins   Social History   Tobacco Use  . Smoking status: Former Smoker    Packs/day: 1.00    Years: 10.00    Pack years: 10.00    Types: Cigarettes    Last attempt to quit: 01/04/1968    Years since quitting: 50.4  . Smokeless tobacco: Never Used  Substance Use Topics  . Alcohol use: No  . Drug use: No     Family Hx: The patient's  family history includes Breast cancer in her mother.  ROS:   Please see the history of present illness.     All other systems reviewed and are negative.   Prior CV studies:   The following studies were reviewed today:  Echocardiogram 12/08/2017 Study Conclusions  - Left ventricle: abnormal septal motion. The cavity size was   normal. Wall thickness was normal. Systolic function was normal.   The estimated ejection fraction was in the range of 55% to 60%.   Doppler parameters are consistent with abnormal left ventricular   relaxation (grade 1 diastolic dysfunction). - Mitral valve: Calcified annulus. Mildly thickened leaflets . - Atrial septum: No defect or patent foramen ovale was identified. - Tricuspid valve: There was mild-moderate regurgitation. - Pulmonary arteries: PA peak pressure: 41 mm Hg (S).  Labs/Other Tests and Data Reviewed:    EKG:  An ECG dated 05/21/18 was personally reviewed today and demonstrated:  Sinus rhythm  LVH with IVCD, LAD and secondary repol abnrm, no change from previous  Recent Labs: 09/19/2017: TSH 1.37 10/24/2017: ALT 11 05/21/2018: BUN 23; Creatinine, Ser 1.31; Hemoglobin 13.3; Platelets 216; Potassium 4.2; Sodium 139   Recent Lipid Panel Lab Results  Component Value Date/Time   CHOL 176 03/09/2017 08:26 AM   TRIG 82.0 03/09/2017 08:26 AM   HDL 78.90 03/09/2017 08:26 AM   CHOLHDL 2 03/09/2017 08:26 AM   LDLCALC 81 03/09/2017 08:26 AM    Wt Readings from Last 3 Encounters:  12/19/17 164 lb 6.4 oz (74.6 kg)  12/17/17 172 lb 0.1 oz (78 kg)  12/07/17 172 lb 9.9 oz (78.3 kg)     Objective:    Vital Signs:  There were no vitals taken for this visit.   VITAL SIGNS:  reviewed GEN:  no acute distress EYES:  sclerae anicteric, EOMI - Extraocular Movements Intact RESPIRATORY:  normal respiratory effort, symmetric expansion CARDIOVASCULAR:  no peripheral edema NEURO:  alert and oriented x 3, no obvious focal deficit PSYCH:  normal  affect  ASSESSMENT & PLAN:    1. Hx of PE on life long coumadin -Followed in our coumadin clinic. INR supra therapeutic at 4.0 on 6/3. Adjustments made.  -She is due to repeat INR on Wed.  -No unusual bleeding. Hgb stable on last labs 05/21/18.  2. Hypertension -On lisinopril 40 mg, metoprolol 25 mg BID -When the HHN comes in -BP has been normal, pt report of pt and her daughter.  -Continue current therapy.   3. Chronic diastolic HF -Has prn lasix -No current swelling, uses lasix rarely. No dyspnea or orthopnea.   4. Hyperlipidemia -On simvastatin 20 mg daily -LDL was 81 in 03/2017, well controlled.   COVID-19 Education: The signs and symptoms of COVID-19 were discussed with the patient and how to seek care for testing (follow up with PCP or arrange E-visit).  The importance of social distancing was discussed today.  Time:   Today, I have spent 10 minutes with the patient with telehealth technology discussing the above problems.     Medication Adjustments/Labs and Tests Ordered: Current medicines are reviewed at length with the patient today.  Concerns regarding medicines are outlined above.   Tests Ordered: No orders of the defined types were placed in this encounter.   Medication Changes: No orders of the defined types were placed in this encounter.   Disposition:  Follow up in 1 year(s)  Signed, Daune Perch, NP  06/11/2018 2:29 PM    [place]

## 2018-06-11 NOTE — Telephone Encounter (Signed)
-----   Message from [name], MD sent at 06/08/2018  7:24 PM EDT ----- Please see if we can get bloodwork I ordered to home nurse before weds? She is having INR done at that time and is due for recheck of labs.

## 2018-06-12 ENCOUNTER — Telehealth: Payer: Self-pay | Admitting: *Deleted

## 2018-06-12 ENCOUNTER — Other Ambulatory Visit: Payer: Self-pay

## 2018-06-12 ENCOUNTER — Ambulatory Visit (INDEPENDENT_AMBULATORY_CARE_PROVIDER_SITE_OTHER): Payer: Medicare Other | Admitting: Pharmacist

## 2018-06-12 ENCOUNTER — Other Ambulatory Visit (INDEPENDENT_AMBULATORY_CARE_PROVIDER_SITE_OTHER): Payer: Medicare Other

## 2018-06-12 DIAGNOSIS — Z7901 Long term (current) use of anticoagulants: Secondary | ICD-10-CM | POA: Diagnosis not present

## 2018-06-12 DIAGNOSIS — E1142 Type 2 diabetes mellitus with diabetic polyneuropathy: Secondary | ICD-10-CM

## 2018-06-12 DIAGNOSIS — Z5181 Encounter for therapeutic drug level monitoring: Secondary | ICD-10-CM | POA: Diagnosis not present

## 2018-06-12 DIAGNOSIS — Z9181 History of falling: Secondary | ICD-10-CM | POA: Diagnosis not present

## 2018-06-12 DIAGNOSIS — I10 Essential (primary) hypertension: Secondary | ICD-10-CM | POA: Diagnosis not present

## 2018-06-12 DIAGNOSIS — G629 Polyneuropathy, unspecified: Secondary | ICD-10-CM

## 2018-06-12 DIAGNOSIS — Z96652 Presence of left artificial knee joint: Secondary | ICD-10-CM | POA: Diagnosis not present

## 2018-06-12 DIAGNOSIS — W19XXXD Unspecified fall, subsequent encounter: Secondary | ICD-10-CM | POA: Diagnosis not present

## 2018-06-12 DIAGNOSIS — E785 Hyperlipidemia, unspecified: Secondary | ICD-10-CM

## 2018-06-12 DIAGNOSIS — E039 Hypothyroidism, unspecified: Secondary | ICD-10-CM | POA: Diagnosis not present

## 2018-06-12 DIAGNOSIS — E038 Other specified hypothyroidism: Secondary | ICD-10-CM

## 2018-06-12 DIAGNOSIS — Z86711 Personal history of pulmonary embolism: Secondary | ICD-10-CM | POA: Diagnosis not present

## 2018-06-12 DIAGNOSIS — I110 Hypertensive heart disease with heart failure: Secondary | ICD-10-CM | POA: Diagnosis not present

## 2018-06-12 DIAGNOSIS — S8002XD Contusion of left knee, subsequent encounter: Secondary | ICD-10-CM | POA: Diagnosis not present

## 2018-06-12 DIAGNOSIS — E1169 Type 2 diabetes mellitus with other specified complication: Secondary | ICD-10-CM

## 2018-06-12 DIAGNOSIS — Z96649 Presence of unspecified artificial hip joint: Secondary | ICD-10-CM | POA: Diagnosis not present

## 2018-06-12 DIAGNOSIS — R296 Repeated falls: Secondary | ICD-10-CM | POA: Diagnosis not present

## 2018-06-12 DIAGNOSIS — D649 Anemia, unspecified: Secondary | ICD-10-CM

## 2018-06-12 DIAGNOSIS — Z433 Encounter for attention to colostomy: Secondary | ICD-10-CM | POA: Diagnosis not present

## 2018-06-12 DIAGNOSIS — R531 Weakness: Secondary | ICD-10-CM | POA: Diagnosis not present

## 2018-06-12 DIAGNOSIS — I272 Pulmonary hypertension, unspecified: Secondary | ICD-10-CM | POA: Diagnosis not present

## 2018-06-12 DIAGNOSIS — I5032 Chronic diastolic (congestive) heart failure: Secondary | ICD-10-CM | POA: Diagnosis not present

## 2018-06-12 DIAGNOSIS — M1990 Unspecified osteoarthritis, unspecified site: Secondary | ICD-10-CM | POA: Diagnosis not present

## 2018-06-12 LAB — POCT INR: INR: 1.6 — AB (ref 2.0–3.0)

## 2018-06-12 NOTE — Telephone Encounter (Signed)
Hannah [name] is at the pt's home and unable to draw blood please advise.

## 2018-06-12 NOTE — Telephone Encounter (Signed)
I called Caryl Pina and she stated the pt was a difficult stick, she attempted to obtain blood twice and could not do so.  Caryl Pina stated she was able to perform the INR and urine was obtained.  I called the pts daughter and scheduled a lab appt for today at 3:15pm.

## 2018-06-12 NOTE — Telephone Encounter (Signed)
Copied from [place] [phone]. Topic: General - Other >> Jun 12, 2018 11:13 AM Yvette Rack wrote: Reason for CRM: Pt daughter Marcy Salvo called in for Wendie Simmer. Sharyn Lull stated home health advised her that pt needs blood work done and to contact pcp office. Sharyn Lull requests a call back at [phone]

## 2018-06-13 LAB — CBC WITH DIFFERENTIAL/PLATELET
Basophils Absolute: 0.1 10*3/uL (ref 0.0–0.1)
Basophils Relative: 1.5 % (ref 0.0–3.0)
Eosinophils Absolute: 0.2 10*3/uL (ref 0.0–0.7)
Eosinophils Relative: 1.7 % (ref 0.0–5.0)
HCT: 39.2 % (ref 36.0–46.0)
Hemoglobin: 12.4 g/dL (ref 12.0–15.0)
Lymphocytes Relative: 36.4 % (ref 12.0–46.0)
Lymphs Abs: 3.4 10*3/uL (ref 0.7–4.0)
MCHC: 31.6 g/dL (ref 30.0–36.0)
MCV: 90.3 fl (ref 78.0–100.0)
Monocytes Absolute: 0.4 10*3/uL (ref 0.1–1.0)
Monocytes Relative: 4.6 % (ref 3.0–12.0)
Neutro Abs: 5.2 10*3/uL (ref 1.4–7.7)
Neutrophils Relative %: 55.8 % (ref 43.0–77.0)
Platelets: 264 10*3/uL (ref 150.0–400.0)
RBC: 4.34 Mil/uL (ref 3.87–5.11)
RDW: 17.6 % — ABNORMAL HIGH (ref 11.5–15.5)
WBC: 9.3 10*3/uL (ref 4.0–10.5)

## 2018-06-13 LAB — COMPREHENSIVE METABOLIC PANEL
ALT: 9 U/L (ref 0–35)
AST: 14 U/L (ref 0–37)
Albumin: 4 g/dL (ref 3.5–5.2)
Alkaline Phosphatase: 64 U/L (ref 39–117)
BUN: 20 mg/dL (ref 6–23)
CO2: 26 mEq/L (ref 19–32)
Calcium: 9.4 mg/dL (ref 8.4–10.5)
Chloride: 103 mEq/L (ref 96–112)
Creatinine, Ser: 1.12 mg/dL (ref 0.40–1.20)
GFR: 57.07 mL/min — ABNORMAL LOW (ref >60.00)
Glucose, Bld: 99 mg/dL (ref 70–99)
Potassium: 5 mEq/L (ref 3.5–5.1)
Sodium: 138 mEq/L (ref 135–145)
Total Bilirubin: 0.7 mg/dL (ref 0.2–1.2)
Total Protein: 6.5 g/dL (ref 6.0–8.3)

## 2018-06-13 LAB — TSH: TSH: 1.61 u[IU]/mL (ref 0.35–4.50)

## 2018-06-13 LAB — LIPID PANEL
Cholesterol: 151 mg/dL (ref 0–200)
HDL: 57.2 mg/dL (ref >39.00)
LDL Cholesterol: 78 mg/dL (ref 0–99)
NonHDL: 93.65
Total CHOL/HDL Ratio: 3
Triglycerides: 80 mg/dL (ref 0.0–149.0)
VLDL: 16 mg/dL (ref 0.0–40.0)

## 2018-06-13 LAB — VITAMIN B12: Vitamin B-12: 120 pg/mL — ABNORMAL LOW (ref 211–911)

## 2018-06-13 LAB — HEMOGLOBIN A1C: Hgb A1c MFr Bld: 6.1 % (ref 4.6–6.5)

## 2018-06-15 ENCOUNTER — Other Ambulatory Visit: Payer: Self-pay | Admitting: Family Medicine

## 2018-06-19 DIAGNOSIS — Z7901 Long term (current) use of anticoagulants: Secondary | ICD-10-CM | POA: Diagnosis not present

## 2018-06-19 DIAGNOSIS — Z9181 History of falling: Secondary | ICD-10-CM | POA: Diagnosis not present

## 2018-06-19 DIAGNOSIS — M1990 Unspecified osteoarthritis, unspecified site: Secondary | ICD-10-CM | POA: Diagnosis not present

## 2018-06-19 DIAGNOSIS — Z433 Encounter for attention to colostomy: Secondary | ICD-10-CM | POA: Diagnosis not present

## 2018-06-19 DIAGNOSIS — R296 Repeated falls: Secondary | ICD-10-CM | POA: Diagnosis not present

## 2018-06-19 DIAGNOSIS — I5032 Chronic diastolic (congestive) heart failure: Secondary | ICD-10-CM | POA: Diagnosis not present

## 2018-06-19 DIAGNOSIS — S8002XD Contusion of left knee, subsequent encounter: Secondary | ICD-10-CM | POA: Diagnosis not present

## 2018-06-19 DIAGNOSIS — Z96652 Presence of left artificial knee joint: Secondary | ICD-10-CM | POA: Diagnosis not present

## 2018-06-19 DIAGNOSIS — I110 Hypertensive heart disease with heart failure: Secondary | ICD-10-CM | POA: Diagnosis not present

## 2018-06-19 DIAGNOSIS — E1142 Type 2 diabetes mellitus with diabetic polyneuropathy: Secondary | ICD-10-CM | POA: Diagnosis not present

## 2018-06-19 DIAGNOSIS — Z86711 Personal history of pulmonary embolism: Secondary | ICD-10-CM | POA: Diagnosis not present

## 2018-06-19 DIAGNOSIS — I272 Pulmonary hypertension, unspecified: Secondary | ICD-10-CM | POA: Diagnosis not present

## 2018-06-19 DIAGNOSIS — Z96649 Presence of unspecified artificial hip joint: Secondary | ICD-10-CM | POA: Diagnosis not present

## 2018-06-19 DIAGNOSIS — E039 Hypothyroidism, unspecified: Secondary | ICD-10-CM | POA: Diagnosis not present

## 2018-06-19 DIAGNOSIS — W19XXXD Unspecified fall, subsequent encounter: Secondary | ICD-10-CM | POA: Diagnosis not present

## 2018-06-20 ENCOUNTER — Telehealth: Payer: Self-pay | Admitting: *Deleted

## 2018-06-20 DIAGNOSIS — Z7901 Long term (current) use of anticoagulants: Secondary | ICD-10-CM | POA: Diagnosis not present

## 2018-06-20 DIAGNOSIS — I272 Pulmonary hypertension, unspecified: Secondary | ICD-10-CM | POA: Diagnosis not present

## 2018-06-20 DIAGNOSIS — Z5181 Encounter for therapeutic drug level monitoring: Secondary | ICD-10-CM | POA: Diagnosis not present

## 2018-06-20 DIAGNOSIS — I2699 Other pulmonary embolism without acute cor pulmonale: Secondary | ICD-10-CM | POA: Diagnosis not present

## 2018-06-20 DIAGNOSIS — Z96652 Presence of left artificial knee joint: Secondary | ICD-10-CM | POA: Diagnosis not present

## 2018-06-20 DIAGNOSIS — Z96649 Presence of unspecified artificial hip joint: Secondary | ICD-10-CM | POA: Diagnosis not present

## 2018-06-20 DIAGNOSIS — S8002XD Contusion of left knee, subsequent encounter: Secondary | ICD-10-CM | POA: Diagnosis not present

## 2018-06-20 DIAGNOSIS — I110 Hypertensive heart disease with heart failure: Secondary | ICD-10-CM | POA: Diagnosis not present

## 2018-06-20 DIAGNOSIS — E1142 Type 2 diabetes mellitus with diabetic polyneuropathy: Secondary | ICD-10-CM | POA: Diagnosis not present

## 2018-06-20 DIAGNOSIS — Z86711 Personal history of pulmonary embolism: Secondary | ICD-10-CM | POA: Diagnosis not present

## 2018-06-20 DIAGNOSIS — I5032 Chronic diastolic (congestive) heart failure: Secondary | ICD-10-CM | POA: Diagnosis not present

## 2018-06-20 DIAGNOSIS — Z9181 History of falling: Secondary | ICD-10-CM | POA: Diagnosis not present

## 2018-06-20 DIAGNOSIS — M1990 Unspecified osteoarthritis, unspecified site: Secondary | ICD-10-CM | POA: Diagnosis not present

## 2018-06-20 DIAGNOSIS — E039 Hypothyroidism, unspecified: Secondary | ICD-10-CM | POA: Diagnosis not present

## 2018-06-20 DIAGNOSIS — Z433 Encounter for attention to colostomy: Secondary | ICD-10-CM | POA: Diagnosis not present

## 2018-06-20 DIAGNOSIS — R296 Repeated falls: Secondary | ICD-10-CM | POA: Diagnosis not present

## 2018-06-20 DIAGNOSIS — W19XXXD Unspecified fall, subsequent encounter: Secondary | ICD-10-CM | POA: Diagnosis not present

## 2018-06-20 NOTE — Telephone Encounter (Addendum)
Patient was due for an INR on 06/19/2018, called [facility] because they have been obtaining it and orders were given to [place] on 06/05/2018 to do so. After speaking with Sharyn Lull the person answering the phone she stated the pt will have a visit on today so she gave [place] the nurse number to contact her and gave [place] Julie's number to call.  Mcarthur Rossetti at [phone] and she stated she was going to see the patient at 3p which was in 8 minutes from the time we called and she stated did not have the equipment to do so, so she stated she would call back and let us know the plan.  Almyra Free called back minutes and stated the pt had her labs done on Monday and after assessing the chart returned a call to Almyra Free and notified her the labs were done last Monday when we had an INR done and not 2 days ago Monday, she stated that she was with another patient and that is all she knows so we need to call the office regarding this and have a good day and goodbye.  Called the office and spoke with Suanne Marker and updated her about the situation and she apologized for Julie's behavior and stated that she could send someone out tomorrow.  Minutes later Almyra Free called back and was at the pt's home and she stated she could obtain an INR if given an order and I gave her an order and she stated she would take to the lab after it is drawn and expect it tomorrow. Returned a call to [place] to update her regarding the situation and she was was thankful. Will await results.

## 2018-06-21 ENCOUNTER — Ambulatory Visit (INDEPENDENT_AMBULATORY_CARE_PROVIDER_SITE_OTHER): Payer: Medicare Other

## 2018-06-21 DIAGNOSIS — S8002XD Contusion of left knee, subsequent encounter: Secondary | ICD-10-CM | POA: Diagnosis not present

## 2018-06-21 DIAGNOSIS — E1142 Type 2 diabetes mellitus with diabetic polyneuropathy: Secondary | ICD-10-CM | POA: Diagnosis not present

## 2018-06-21 DIAGNOSIS — Z433 Encounter for attention to colostomy: Secondary | ICD-10-CM | POA: Diagnosis not present

## 2018-06-21 DIAGNOSIS — I272 Pulmonary hypertension, unspecified: Secondary | ICD-10-CM | POA: Diagnosis not present

## 2018-06-21 DIAGNOSIS — I5032 Chronic diastolic (congestive) heart failure: Secondary | ICD-10-CM | POA: Diagnosis not present

## 2018-06-21 DIAGNOSIS — Z96649 Presence of unspecified artificial hip joint: Secondary | ICD-10-CM | POA: Diagnosis not present

## 2018-06-21 DIAGNOSIS — Z5181 Encounter for therapeutic drug level monitoring: Secondary | ICD-10-CM | POA: Diagnosis not present

## 2018-06-21 DIAGNOSIS — R296 Repeated falls: Secondary | ICD-10-CM | POA: Diagnosis not present

## 2018-06-21 DIAGNOSIS — Z86711 Personal history of pulmonary embolism: Secondary | ICD-10-CM | POA: Diagnosis not present

## 2018-06-21 DIAGNOSIS — W19XXXD Unspecified fall, subsequent encounter: Secondary | ICD-10-CM | POA: Diagnosis not present

## 2018-06-21 DIAGNOSIS — Z9181 History of falling: Secondary | ICD-10-CM | POA: Diagnosis not present

## 2018-06-21 DIAGNOSIS — I110 Hypertensive heart disease with heart failure: Secondary | ICD-10-CM | POA: Diagnosis not present

## 2018-06-21 DIAGNOSIS — M1990 Unspecified osteoarthritis, unspecified site: Secondary | ICD-10-CM | POA: Diagnosis not present

## 2018-06-21 DIAGNOSIS — Z7901 Long term (current) use of anticoagulants: Secondary | ICD-10-CM

## 2018-06-21 DIAGNOSIS — E039 Hypothyroidism, unspecified: Secondary | ICD-10-CM | POA: Diagnosis not present

## 2018-06-21 DIAGNOSIS — Z96652 Presence of left artificial knee joint: Secondary | ICD-10-CM | POA: Diagnosis not present

## 2018-06-21 LAB — PROTIME-INR: INR: 3 — AB (ref 0.9–1.1)

## 2018-06-21 NOTE — Progress Notes (Signed)
Called [place] and left VM on machine for [facility] for next INR check on 6/30.

## 2018-06-22 DIAGNOSIS — R296 Repeated falls: Secondary | ICD-10-CM | POA: Diagnosis not present

## 2018-06-22 DIAGNOSIS — E1142 Type 2 diabetes mellitus with diabetic polyneuropathy: Secondary | ICD-10-CM | POA: Diagnosis not present

## 2018-06-22 DIAGNOSIS — M1990 Unspecified osteoarthritis, unspecified site: Secondary | ICD-10-CM | POA: Diagnosis not present

## 2018-06-22 DIAGNOSIS — S8002XD Contusion of left knee, subsequent encounter: Secondary | ICD-10-CM | POA: Diagnosis not present

## 2018-06-22 DIAGNOSIS — Z86711 Personal history of pulmonary embolism: Secondary | ICD-10-CM | POA: Diagnosis not present

## 2018-06-22 DIAGNOSIS — W19XXXD Unspecified fall, subsequent encounter: Secondary | ICD-10-CM | POA: Diagnosis not present

## 2018-06-22 DIAGNOSIS — Z433 Encounter for attention to colostomy: Secondary | ICD-10-CM | POA: Diagnosis not present

## 2018-06-22 DIAGNOSIS — I5032 Chronic diastolic (congestive) heart failure: Secondary | ICD-10-CM | POA: Diagnosis not present

## 2018-06-22 DIAGNOSIS — I272 Pulmonary hypertension, unspecified: Secondary | ICD-10-CM | POA: Diagnosis not present

## 2018-06-22 DIAGNOSIS — Z96652 Presence of left artificial knee joint: Secondary | ICD-10-CM | POA: Diagnosis not present

## 2018-06-22 DIAGNOSIS — I110 Hypertensive heart disease with heart failure: Secondary | ICD-10-CM | POA: Diagnosis not present

## 2018-06-22 DIAGNOSIS — Z96649 Presence of unspecified artificial hip joint: Secondary | ICD-10-CM | POA: Diagnosis not present

## 2018-06-22 DIAGNOSIS — E039 Hypothyroidism, unspecified: Secondary | ICD-10-CM | POA: Diagnosis not present

## 2018-06-22 DIAGNOSIS — Z9181 History of falling: Secondary | ICD-10-CM | POA: Diagnosis not present

## 2018-06-22 DIAGNOSIS — Z7901 Long term (current) use of anticoagulants: Secondary | ICD-10-CM | POA: Diagnosis not present

## 2018-06-25 DIAGNOSIS — Z96649 Presence of unspecified artificial hip joint: Secondary | ICD-10-CM | POA: Diagnosis not present

## 2018-06-25 DIAGNOSIS — W19XXXD Unspecified fall, subsequent encounter: Secondary | ICD-10-CM | POA: Diagnosis not present

## 2018-06-25 DIAGNOSIS — Z86711 Personal history of pulmonary embolism: Secondary | ICD-10-CM | POA: Diagnosis not present

## 2018-06-25 DIAGNOSIS — Z7901 Long term (current) use of anticoagulants: Secondary | ICD-10-CM | POA: Diagnosis not present

## 2018-06-25 DIAGNOSIS — M1990 Unspecified osteoarthritis, unspecified site: Secondary | ICD-10-CM | POA: Diagnosis not present

## 2018-06-25 DIAGNOSIS — R296 Repeated falls: Secondary | ICD-10-CM | POA: Diagnosis not present

## 2018-06-25 DIAGNOSIS — Z9181 History of falling: Secondary | ICD-10-CM | POA: Diagnosis not present

## 2018-06-25 DIAGNOSIS — E039 Hypothyroidism, unspecified: Secondary | ICD-10-CM | POA: Diagnosis not present

## 2018-06-25 DIAGNOSIS — I110 Hypertensive heart disease with heart failure: Secondary | ICD-10-CM | POA: Diagnosis not present

## 2018-06-25 DIAGNOSIS — S8002XD Contusion of left knee, subsequent encounter: Secondary | ICD-10-CM | POA: Diagnosis not present

## 2018-06-25 DIAGNOSIS — Z96652 Presence of left artificial knee joint: Secondary | ICD-10-CM | POA: Diagnosis not present

## 2018-06-25 DIAGNOSIS — E1142 Type 2 diabetes mellitus with diabetic polyneuropathy: Secondary | ICD-10-CM | POA: Diagnosis not present

## 2018-06-25 DIAGNOSIS — Z433 Encounter for attention to colostomy: Secondary | ICD-10-CM | POA: Diagnosis not present

## 2018-06-25 DIAGNOSIS — I5032 Chronic diastolic (congestive) heart failure: Secondary | ICD-10-CM | POA: Diagnosis not present

## 2018-06-25 DIAGNOSIS — I272 Pulmonary hypertension, unspecified: Secondary | ICD-10-CM | POA: Diagnosis not present

## 2018-06-26 ENCOUNTER — Ambulatory Visit (INDEPENDENT_AMBULATORY_CARE_PROVIDER_SITE_OTHER): Payer: Medicare Other | Admitting: Pharmacist

## 2018-06-26 ENCOUNTER — Telehealth: Payer: Self-pay | Admitting: Family Medicine

## 2018-06-26 DIAGNOSIS — Z433 Encounter for attention to colostomy: Secondary | ICD-10-CM | POA: Diagnosis not present

## 2018-06-26 DIAGNOSIS — Z96652 Presence of left artificial knee joint: Secondary | ICD-10-CM | POA: Diagnosis not present

## 2018-06-26 DIAGNOSIS — Z5181 Encounter for therapeutic drug level monitoring: Secondary | ICD-10-CM

## 2018-06-26 DIAGNOSIS — Z7901 Long term (current) use of anticoagulants: Secondary | ICD-10-CM

## 2018-06-26 DIAGNOSIS — Z9181 History of falling: Secondary | ICD-10-CM | POA: Diagnosis not present

## 2018-06-26 DIAGNOSIS — S8002XD Contusion of left knee, subsequent encounter: Secondary | ICD-10-CM | POA: Diagnosis not present

## 2018-06-26 DIAGNOSIS — M1990 Unspecified osteoarthritis, unspecified site: Secondary | ICD-10-CM | POA: Diagnosis not present

## 2018-06-26 DIAGNOSIS — W19XXXD Unspecified fall, subsequent encounter: Secondary | ICD-10-CM | POA: Diagnosis not present

## 2018-06-26 DIAGNOSIS — I5032 Chronic diastolic (congestive) heart failure: Secondary | ICD-10-CM | POA: Diagnosis not present

## 2018-06-26 DIAGNOSIS — Z96649 Presence of unspecified artificial hip joint: Secondary | ICD-10-CM | POA: Diagnosis not present

## 2018-06-26 DIAGNOSIS — I110 Hypertensive heart disease with heart failure: Secondary | ICD-10-CM | POA: Diagnosis not present

## 2018-06-26 DIAGNOSIS — E039 Hypothyroidism, unspecified: Secondary | ICD-10-CM | POA: Diagnosis not present

## 2018-06-26 DIAGNOSIS — R296 Repeated falls: Secondary | ICD-10-CM | POA: Diagnosis not present

## 2018-06-26 DIAGNOSIS — E1142 Type 2 diabetes mellitus with diabetic polyneuropathy: Secondary | ICD-10-CM | POA: Diagnosis not present

## 2018-06-26 DIAGNOSIS — Z86711 Personal history of pulmonary embolism: Secondary | ICD-10-CM | POA: Diagnosis not present

## 2018-06-26 DIAGNOSIS — I272 Pulmonary hypertension, unspecified: Secondary | ICD-10-CM | POA: Diagnosis not present

## 2018-06-26 LAB — POCT INR: INR: 2.7 (ref 2.0–3.0)

## 2018-06-26 NOTE — Telephone Encounter (Signed)
Ok to order, but This will probably have to come from Dr. Ethlyn Gallery as will require face to face in 90 days?

## 2018-06-26 NOTE — Telephone Encounter (Signed)
I called Monique and informed her of the verbal orders as below.

## 2018-06-26 NOTE — Telephone Encounter (Signed)
Caller/Agency Monique with [facility] Number [phone] [id number]  verbal home health PT  Frequency 2 x 3

## 2018-06-27 DIAGNOSIS — E039 Hypothyroidism, unspecified: Secondary | ICD-10-CM | POA: Diagnosis not present

## 2018-06-27 DIAGNOSIS — S8002XD Contusion of left knee, subsequent encounter: Secondary | ICD-10-CM | POA: Diagnosis not present

## 2018-06-27 DIAGNOSIS — Z96649 Presence of unspecified artificial hip joint: Secondary | ICD-10-CM | POA: Diagnosis not present

## 2018-06-27 DIAGNOSIS — Z86711 Personal history of pulmonary embolism: Secondary | ICD-10-CM | POA: Diagnosis not present

## 2018-06-27 DIAGNOSIS — R296 Repeated falls: Secondary | ICD-10-CM | POA: Diagnosis not present

## 2018-06-27 DIAGNOSIS — Z7901 Long term (current) use of anticoagulants: Secondary | ICD-10-CM | POA: Diagnosis not present

## 2018-06-27 DIAGNOSIS — Z9181 History of falling: Secondary | ICD-10-CM | POA: Diagnosis not present

## 2018-06-27 DIAGNOSIS — M1990 Unspecified osteoarthritis, unspecified site: Secondary | ICD-10-CM | POA: Diagnosis not present

## 2018-06-27 DIAGNOSIS — E1142 Type 2 diabetes mellitus with diabetic polyneuropathy: Secondary | ICD-10-CM | POA: Diagnosis not present

## 2018-06-27 DIAGNOSIS — Z96652 Presence of left artificial knee joint: Secondary | ICD-10-CM | POA: Diagnosis not present

## 2018-06-27 DIAGNOSIS — Z433 Encounter for attention to colostomy: Secondary | ICD-10-CM | POA: Diagnosis not present

## 2018-06-27 DIAGNOSIS — I110 Hypertensive heart disease with heart failure: Secondary | ICD-10-CM | POA: Diagnosis not present

## 2018-06-27 DIAGNOSIS — I5032 Chronic diastolic (congestive) heart failure: Secondary | ICD-10-CM | POA: Diagnosis not present

## 2018-06-27 DIAGNOSIS — I272 Pulmonary hypertension, unspecified: Secondary | ICD-10-CM | POA: Diagnosis not present

## 2018-06-27 DIAGNOSIS — W19XXXD Unspecified fall, subsequent encounter: Secondary | ICD-10-CM | POA: Diagnosis not present

## 2018-06-28 ENCOUNTER — Telehealth: Payer: Self-pay | Admitting: Family Medicine

## 2018-06-28 MED ORDER — FUROSEMIDE 20 MG PO TABS: 20.0000 mg | ORAL_TABLET | Freq: Every day | ORAL | 3 refills | Status: DC | PRN

## 2018-06-28 NOTE — Telephone Encounter (Signed)
REFILL furosemide (LASIX) 20 MG tablet  [place], [place] - [address] [phone] (Phone) [phone] (Fax)

## 2018-06-28 NOTE — Telephone Encounter (Signed)
REFILL glucose blood (ONETOUCH VERIO) test strip  [place], [place] - [address] [phone] (Phone) [phone] (Fax)

## 2018-06-29 ENCOUNTER — Other Ambulatory Visit: Payer: Self-pay | Admitting: Family Medicine

## 2018-06-29 DIAGNOSIS — R296 Repeated falls: Secondary | ICD-10-CM | POA: Diagnosis not present

## 2018-06-29 DIAGNOSIS — E039 Hypothyroidism, unspecified: Secondary | ICD-10-CM | POA: Diagnosis not present

## 2018-06-29 DIAGNOSIS — Z96652 Presence of left artificial knee joint: Secondary | ICD-10-CM | POA: Diagnosis not present

## 2018-06-29 DIAGNOSIS — I110 Hypertensive heart disease with heart failure: Secondary | ICD-10-CM | POA: Diagnosis not present

## 2018-06-29 DIAGNOSIS — I5032 Chronic diastolic (congestive) heart failure: Secondary | ICD-10-CM | POA: Diagnosis not present

## 2018-06-29 DIAGNOSIS — I272 Pulmonary hypertension, unspecified: Secondary | ICD-10-CM | POA: Diagnosis not present

## 2018-06-29 DIAGNOSIS — Z86711 Personal history of pulmonary embolism: Secondary | ICD-10-CM | POA: Diagnosis not present

## 2018-06-29 DIAGNOSIS — S8002XD Contusion of left knee, subsequent encounter: Secondary | ICD-10-CM | POA: Diagnosis not present

## 2018-06-29 DIAGNOSIS — Z96649 Presence of unspecified artificial hip joint: Secondary | ICD-10-CM | POA: Diagnosis not present

## 2018-06-29 DIAGNOSIS — Z7901 Long term (current) use of anticoagulants: Secondary | ICD-10-CM | POA: Diagnosis not present

## 2018-06-29 DIAGNOSIS — Z9181 History of falling: Secondary | ICD-10-CM | POA: Diagnosis not present

## 2018-06-29 DIAGNOSIS — E1142 Type 2 diabetes mellitus with diabetic polyneuropathy: Secondary | ICD-10-CM | POA: Diagnosis not present

## 2018-06-29 DIAGNOSIS — W19XXXD Unspecified fall, subsequent encounter: Secondary | ICD-10-CM | POA: Diagnosis not present

## 2018-06-29 DIAGNOSIS — M1990 Unspecified osteoarthritis, unspecified site: Secondary | ICD-10-CM | POA: Diagnosis not present

## 2018-06-29 DIAGNOSIS — Z433 Encounter for attention to colostomy: Secondary | ICD-10-CM | POA: Diagnosis not present

## 2018-06-30 DIAGNOSIS — M1990 Unspecified osteoarthritis, unspecified site: Secondary | ICD-10-CM | POA: Diagnosis not present

## 2018-06-30 DIAGNOSIS — G629 Polyneuropathy, unspecified: Secondary | ICD-10-CM | POA: Diagnosis not present

## 2018-06-30 DIAGNOSIS — M25562 Pain in left knee: Secondary | ICD-10-CM | POA: Diagnosis not present

## 2018-06-30 DIAGNOSIS — R296 Repeated falls: Secondary | ICD-10-CM | POA: Diagnosis not present

## 2018-07-03 DIAGNOSIS — Z96652 Presence of left artificial knee joint: Secondary | ICD-10-CM | POA: Diagnosis not present

## 2018-07-03 DIAGNOSIS — M1990 Unspecified osteoarthritis, unspecified site: Secondary | ICD-10-CM | POA: Diagnosis not present

## 2018-07-03 DIAGNOSIS — E039 Hypothyroidism, unspecified: Secondary | ICD-10-CM | POA: Diagnosis not present

## 2018-07-03 DIAGNOSIS — R296 Repeated falls: Secondary | ICD-10-CM | POA: Diagnosis not present

## 2018-07-03 DIAGNOSIS — Z9181 History of falling: Secondary | ICD-10-CM | POA: Diagnosis not present

## 2018-07-03 DIAGNOSIS — Z7901 Long term (current) use of anticoagulants: Secondary | ICD-10-CM | POA: Diagnosis not present

## 2018-07-03 DIAGNOSIS — Z86711 Personal history of pulmonary embolism: Secondary | ICD-10-CM | POA: Diagnosis not present

## 2018-07-03 DIAGNOSIS — S8002XD Contusion of left knee, subsequent encounter: Secondary | ICD-10-CM | POA: Diagnosis not present

## 2018-07-03 DIAGNOSIS — I272 Pulmonary hypertension, unspecified: Secondary | ICD-10-CM | POA: Diagnosis not present

## 2018-07-03 DIAGNOSIS — I5032 Chronic diastolic (congestive) heart failure: Secondary | ICD-10-CM | POA: Diagnosis not present

## 2018-07-03 DIAGNOSIS — W19XXXD Unspecified fall, subsequent encounter: Secondary | ICD-10-CM | POA: Diagnosis not present

## 2018-07-03 DIAGNOSIS — I110 Hypertensive heart disease with heart failure: Secondary | ICD-10-CM | POA: Diagnosis not present

## 2018-07-03 DIAGNOSIS — Z96649 Presence of unspecified artificial hip joint: Secondary | ICD-10-CM | POA: Diagnosis not present

## 2018-07-03 DIAGNOSIS — E1142 Type 2 diabetes mellitus with diabetic polyneuropathy: Secondary | ICD-10-CM | POA: Diagnosis not present

## 2018-07-03 DIAGNOSIS — Z433 Encounter for attention to colostomy: Secondary | ICD-10-CM | POA: Diagnosis not present

## 2018-07-04 DIAGNOSIS — M1990 Unspecified osteoarthritis, unspecified site: Secondary | ICD-10-CM | POA: Diagnosis not present

## 2018-07-04 DIAGNOSIS — E1142 Type 2 diabetes mellitus with diabetic polyneuropathy: Secondary | ICD-10-CM | POA: Diagnosis not present

## 2018-07-04 DIAGNOSIS — E039 Hypothyroidism, unspecified: Secondary | ICD-10-CM | POA: Diagnosis not present

## 2018-07-04 DIAGNOSIS — I272 Pulmonary hypertension, unspecified: Secondary | ICD-10-CM | POA: Diagnosis not present

## 2018-07-04 DIAGNOSIS — Z96649 Presence of unspecified artificial hip joint: Secondary | ICD-10-CM | POA: Diagnosis not present

## 2018-07-04 DIAGNOSIS — Z7901 Long term (current) use of anticoagulants: Secondary | ICD-10-CM | POA: Diagnosis not present

## 2018-07-04 DIAGNOSIS — Z86711 Personal history of pulmonary embolism: Secondary | ICD-10-CM | POA: Diagnosis not present

## 2018-07-04 DIAGNOSIS — R296 Repeated falls: Secondary | ICD-10-CM | POA: Diagnosis not present

## 2018-07-04 DIAGNOSIS — W19XXXD Unspecified fall, subsequent encounter: Secondary | ICD-10-CM | POA: Diagnosis not present

## 2018-07-04 DIAGNOSIS — I5032 Chronic diastolic (congestive) heart failure: Secondary | ICD-10-CM | POA: Diagnosis not present

## 2018-07-04 DIAGNOSIS — S8002XD Contusion of left knee, subsequent encounter: Secondary | ICD-10-CM | POA: Diagnosis not present

## 2018-07-04 DIAGNOSIS — I110 Hypertensive heart disease with heart failure: Secondary | ICD-10-CM | POA: Diagnosis not present

## 2018-07-04 DIAGNOSIS — Z9181 History of falling: Secondary | ICD-10-CM | POA: Diagnosis not present

## 2018-07-04 DIAGNOSIS — Z433 Encounter for attention to colostomy: Secondary | ICD-10-CM | POA: Diagnosis not present

## 2018-07-04 DIAGNOSIS — Z96652 Presence of left artificial knee joint: Secondary | ICD-10-CM | POA: Diagnosis not present

## 2018-07-05 ENCOUNTER — Telehealth: Payer: Self-pay | Admitting: Family Medicine

## 2018-07-05 DIAGNOSIS — M1990 Unspecified osteoarthritis, unspecified site: Secondary | ICD-10-CM | POA: Diagnosis not present

## 2018-07-05 DIAGNOSIS — S8002XD Contusion of left knee, subsequent encounter: Secondary | ICD-10-CM | POA: Diagnosis not present

## 2018-07-05 DIAGNOSIS — Z433 Encounter for attention to colostomy: Secondary | ICD-10-CM | POA: Diagnosis not present

## 2018-07-05 DIAGNOSIS — I272 Pulmonary hypertension, unspecified: Secondary | ICD-10-CM | POA: Diagnosis not present

## 2018-07-05 DIAGNOSIS — Z86711 Personal history of pulmonary embolism: Secondary | ICD-10-CM | POA: Diagnosis not present

## 2018-07-05 DIAGNOSIS — W19XXXD Unspecified fall, subsequent encounter: Secondary | ICD-10-CM | POA: Diagnosis not present

## 2018-07-05 DIAGNOSIS — Z96652 Presence of left artificial knee joint: Secondary | ICD-10-CM | POA: Diagnosis not present

## 2018-07-05 DIAGNOSIS — E039 Hypothyroidism, unspecified: Secondary | ICD-10-CM | POA: Diagnosis not present

## 2018-07-05 DIAGNOSIS — E1142 Type 2 diabetes mellitus with diabetic polyneuropathy: Secondary | ICD-10-CM | POA: Diagnosis not present

## 2018-07-05 DIAGNOSIS — Z96649 Presence of unspecified artificial hip joint: Secondary | ICD-10-CM | POA: Diagnosis not present

## 2018-07-05 DIAGNOSIS — Z9181 History of falling: Secondary | ICD-10-CM | POA: Diagnosis not present

## 2018-07-05 DIAGNOSIS — I110 Hypertensive heart disease with heart failure: Secondary | ICD-10-CM | POA: Diagnosis not present

## 2018-07-05 DIAGNOSIS — Z7901 Long term (current) use of anticoagulants: Secondary | ICD-10-CM | POA: Diagnosis not present

## 2018-07-05 DIAGNOSIS — I5032 Chronic diastolic (congestive) heart failure: Secondary | ICD-10-CM | POA: Diagnosis not present

## 2018-07-05 DIAGNOSIS — R296 Repeated falls: Secondary | ICD-10-CM | POA: Diagnosis not present

## 2018-07-05 NOTE — Telephone Encounter (Signed)
I left a detailed message on the voicemail with the verbal order as below.

## 2018-07-05 NOTE — Telephone Encounter (Signed)
Hannah [name] needs verbal orders to change visit tomorrow from telehealth to regular visit.

## 2018-07-05 NOTE — Telephone Encounter (Signed)
Ok for verbal orders ?

## 2018-07-06 DIAGNOSIS — I272 Pulmonary hypertension, unspecified: Secondary | ICD-10-CM | POA: Diagnosis not present

## 2018-07-06 DIAGNOSIS — W19XXXD Unspecified fall, subsequent encounter: Secondary | ICD-10-CM | POA: Diagnosis not present

## 2018-07-06 DIAGNOSIS — Z7901 Long term (current) use of anticoagulants: Secondary | ICD-10-CM | POA: Diagnosis not present

## 2018-07-06 DIAGNOSIS — I110 Hypertensive heart disease with heart failure: Secondary | ICD-10-CM | POA: Diagnosis not present

## 2018-07-06 DIAGNOSIS — R296 Repeated falls: Secondary | ICD-10-CM | POA: Diagnosis not present

## 2018-07-06 DIAGNOSIS — Z433 Encounter for attention to colostomy: Secondary | ICD-10-CM | POA: Diagnosis not present

## 2018-07-06 DIAGNOSIS — E039 Hypothyroidism, unspecified: Secondary | ICD-10-CM | POA: Diagnosis not present

## 2018-07-06 DIAGNOSIS — Z96649 Presence of unspecified artificial hip joint: Secondary | ICD-10-CM | POA: Diagnosis not present

## 2018-07-06 DIAGNOSIS — E1142 Type 2 diabetes mellitus with diabetic polyneuropathy: Secondary | ICD-10-CM | POA: Diagnosis not present

## 2018-07-06 DIAGNOSIS — Z9181 History of falling: Secondary | ICD-10-CM | POA: Diagnosis not present

## 2018-07-06 DIAGNOSIS — Z96652 Presence of left artificial knee joint: Secondary | ICD-10-CM | POA: Diagnosis not present

## 2018-07-06 DIAGNOSIS — S8002XD Contusion of left knee, subsequent encounter: Secondary | ICD-10-CM | POA: Diagnosis not present

## 2018-07-06 DIAGNOSIS — I5032 Chronic diastolic (congestive) heart failure: Secondary | ICD-10-CM | POA: Diagnosis not present

## 2018-07-06 DIAGNOSIS — M1990 Unspecified osteoarthritis, unspecified site: Secondary | ICD-10-CM | POA: Diagnosis not present

## 2018-07-06 DIAGNOSIS — Z86711 Personal history of pulmonary embolism: Secondary | ICD-10-CM | POA: Diagnosis not present

## 2018-07-10 DIAGNOSIS — W19XXXD Unspecified fall, subsequent encounter: Secondary | ICD-10-CM | POA: Diagnosis not present

## 2018-07-10 DIAGNOSIS — Z96652 Presence of left artificial knee joint: Secondary | ICD-10-CM | POA: Diagnosis not present

## 2018-07-10 DIAGNOSIS — R296 Repeated falls: Secondary | ICD-10-CM | POA: Diagnosis not present

## 2018-07-10 DIAGNOSIS — E039 Hypothyroidism, unspecified: Secondary | ICD-10-CM | POA: Diagnosis not present

## 2018-07-10 DIAGNOSIS — I272 Pulmonary hypertension, unspecified: Secondary | ICD-10-CM | POA: Diagnosis not present

## 2018-07-10 DIAGNOSIS — Z433 Encounter for attention to colostomy: Secondary | ICD-10-CM | POA: Diagnosis not present

## 2018-07-10 DIAGNOSIS — E1142 Type 2 diabetes mellitus with diabetic polyneuropathy: Secondary | ICD-10-CM | POA: Diagnosis not present

## 2018-07-10 DIAGNOSIS — Z7901 Long term (current) use of anticoagulants: Secondary | ICD-10-CM | POA: Diagnosis not present

## 2018-07-10 DIAGNOSIS — Z96649 Presence of unspecified artificial hip joint: Secondary | ICD-10-CM | POA: Diagnosis not present

## 2018-07-10 DIAGNOSIS — Z9181 History of falling: Secondary | ICD-10-CM | POA: Diagnosis not present

## 2018-07-10 DIAGNOSIS — S8002XD Contusion of left knee, subsequent encounter: Secondary | ICD-10-CM | POA: Diagnosis not present

## 2018-07-10 DIAGNOSIS — I5032 Chronic diastolic (congestive) heart failure: Secondary | ICD-10-CM | POA: Diagnosis not present

## 2018-07-10 DIAGNOSIS — Z86711 Personal history of pulmonary embolism: Secondary | ICD-10-CM | POA: Diagnosis not present

## 2018-07-10 DIAGNOSIS — M1990 Unspecified osteoarthritis, unspecified site: Secondary | ICD-10-CM | POA: Diagnosis not present

## 2018-07-10 DIAGNOSIS — I110 Hypertensive heart disease with heart failure: Secondary | ICD-10-CM | POA: Diagnosis not present

## 2018-07-11 ENCOUNTER — Telehealth: Payer: Self-pay | Admitting: Pharmacist

## 2018-07-11 DIAGNOSIS — M1990 Unspecified osteoarthritis, unspecified site: Secondary | ICD-10-CM | POA: Diagnosis not present

## 2018-07-11 DIAGNOSIS — Z96649 Presence of unspecified artificial hip joint: Secondary | ICD-10-CM | POA: Diagnosis not present

## 2018-07-11 DIAGNOSIS — Z96652 Presence of left artificial knee joint: Secondary | ICD-10-CM | POA: Diagnosis not present

## 2018-07-11 DIAGNOSIS — E039 Hypothyroidism, unspecified: Secondary | ICD-10-CM | POA: Diagnosis not present

## 2018-07-11 DIAGNOSIS — S8002XD Contusion of left knee, subsequent encounter: Secondary | ICD-10-CM | POA: Diagnosis not present

## 2018-07-11 DIAGNOSIS — Z433 Encounter for attention to colostomy: Secondary | ICD-10-CM | POA: Diagnosis not present

## 2018-07-11 DIAGNOSIS — I272 Pulmonary hypertension, unspecified: Secondary | ICD-10-CM | POA: Diagnosis not present

## 2018-07-11 DIAGNOSIS — I5032 Chronic diastolic (congestive) heart failure: Secondary | ICD-10-CM | POA: Diagnosis not present

## 2018-07-11 DIAGNOSIS — Z9181 History of falling: Secondary | ICD-10-CM | POA: Diagnosis not present

## 2018-07-11 DIAGNOSIS — Z7901 Long term (current) use of anticoagulants: Secondary | ICD-10-CM | POA: Diagnosis not present

## 2018-07-11 DIAGNOSIS — I110 Hypertensive heart disease with heart failure: Secondary | ICD-10-CM | POA: Diagnosis not present

## 2018-07-11 DIAGNOSIS — R296 Repeated falls: Secondary | ICD-10-CM | POA: Diagnosis not present

## 2018-07-11 DIAGNOSIS — W19XXXD Unspecified fall, subsequent encounter: Secondary | ICD-10-CM | POA: Diagnosis not present

## 2018-07-11 DIAGNOSIS — E1142 Type 2 diabetes mellitus with diabetic polyneuropathy: Secondary | ICD-10-CM | POA: Diagnosis not present

## 2018-07-11 DIAGNOSIS — Z86711 Personal history of pulmonary embolism: Secondary | ICD-10-CM | POA: Diagnosis not present

## 2018-07-11 NOTE — Telephone Encounter (Signed)
Pt scheduled for INR check on Monday.

## 2018-07-11 NOTE — Telephone Encounter (Signed)
Almyra Free RN called back and stated the lab rejected the sample because it was below the fill line. States she cannot recheck INR since she already discharged the patient. She will call the pt to tell her this. Pt will need Coumadin appt scheduled in clinic. Called cell # listed on file and spoke with patient's daughter who will call us with date pt is available for INR check in the next few days.

## 2018-07-11 NOTE — Telephone Encounter (Signed)
Almyra Free RN with [facility] called to report she drew a venopuncture INR today since she does not have a POC machine. States she gave the lab Coumadin clinic # to call with results. Patient is being discharged today from home health services so f/u appt will need to be made in the office. Almyra Free will be working on 7/9 for [place] to contact in case we do not receive results. Her phone # is [phone].

## 2018-07-12 ENCOUNTER — Telehealth: Payer: Self-pay | Admitting: *Deleted

## 2018-07-12 DIAGNOSIS — E039 Hypothyroidism, unspecified: Secondary | ICD-10-CM | POA: Diagnosis not present

## 2018-07-12 DIAGNOSIS — E1142 Type 2 diabetes mellitus with diabetic polyneuropathy: Secondary | ICD-10-CM | POA: Diagnosis not present

## 2018-07-12 DIAGNOSIS — Z96649 Presence of unspecified artificial hip joint: Secondary | ICD-10-CM | POA: Diagnosis not present

## 2018-07-12 DIAGNOSIS — R296 Repeated falls: Secondary | ICD-10-CM | POA: Diagnosis not present

## 2018-07-12 DIAGNOSIS — Z96652 Presence of left artificial knee joint: Secondary | ICD-10-CM | POA: Diagnosis not present

## 2018-07-12 DIAGNOSIS — W19XXXD Unspecified fall, subsequent encounter: Secondary | ICD-10-CM | POA: Diagnosis not present

## 2018-07-12 DIAGNOSIS — I272 Pulmonary hypertension, unspecified: Secondary | ICD-10-CM | POA: Diagnosis not present

## 2018-07-12 DIAGNOSIS — Z9181 History of falling: Secondary | ICD-10-CM | POA: Diagnosis not present

## 2018-07-12 DIAGNOSIS — I5032 Chronic diastolic (congestive) heart failure: Secondary | ICD-10-CM | POA: Diagnosis not present

## 2018-07-12 DIAGNOSIS — I110 Hypertensive heart disease with heart failure: Secondary | ICD-10-CM | POA: Diagnosis not present

## 2018-07-12 DIAGNOSIS — S8002XD Contusion of left knee, subsequent encounter: Secondary | ICD-10-CM | POA: Diagnosis not present

## 2018-07-12 DIAGNOSIS — Z433 Encounter for attention to colostomy: Secondary | ICD-10-CM | POA: Diagnosis not present

## 2018-07-12 DIAGNOSIS — M1990 Unspecified osteoarthritis, unspecified site: Secondary | ICD-10-CM | POA: Diagnosis not present

## 2018-07-12 DIAGNOSIS — Z7901 Long term (current) use of anticoagulants: Secondary | ICD-10-CM | POA: Diagnosis not present

## 2018-07-12 DIAGNOSIS — Z86711 Personal history of pulmonary embolism: Secondary | ICD-10-CM | POA: Diagnosis not present

## 2018-07-12 NOTE — Telephone Encounter (Signed)
Noted  

## 2018-07-12 NOTE — Telephone Encounter (Signed)
Copied from [place] [phone]. Topic: General - Other >> Jul 11, 2018  4:27 PM Rainey Pines A wrote: Caryl Asp from [place] called to inform provider that patient has been discharged today.

## 2018-07-13 DIAGNOSIS — Z86711 Personal history of pulmonary embolism: Secondary | ICD-10-CM | POA: Diagnosis not present

## 2018-07-13 DIAGNOSIS — E039 Hypothyroidism, unspecified: Secondary | ICD-10-CM | POA: Diagnosis not present

## 2018-07-13 DIAGNOSIS — Z96652 Presence of left artificial knee joint: Secondary | ICD-10-CM | POA: Diagnosis not present

## 2018-07-13 DIAGNOSIS — E1142 Type 2 diabetes mellitus with diabetic polyneuropathy: Secondary | ICD-10-CM | POA: Diagnosis not present

## 2018-07-13 DIAGNOSIS — Z433 Encounter for attention to colostomy: Secondary | ICD-10-CM | POA: Diagnosis not present

## 2018-07-13 DIAGNOSIS — I5032 Chronic diastolic (congestive) heart failure: Secondary | ICD-10-CM | POA: Diagnosis not present

## 2018-07-13 DIAGNOSIS — S8002XD Contusion of left knee, subsequent encounter: Secondary | ICD-10-CM | POA: Diagnosis not present

## 2018-07-13 DIAGNOSIS — I110 Hypertensive heart disease with heart failure: Secondary | ICD-10-CM | POA: Diagnosis not present

## 2018-07-13 DIAGNOSIS — R296 Repeated falls: Secondary | ICD-10-CM | POA: Diagnosis not present

## 2018-07-13 DIAGNOSIS — W19XXXD Unspecified fall, subsequent encounter: Secondary | ICD-10-CM | POA: Diagnosis not present

## 2018-07-13 DIAGNOSIS — I272 Pulmonary hypertension, unspecified: Secondary | ICD-10-CM | POA: Diagnosis not present

## 2018-07-13 DIAGNOSIS — M1990 Unspecified osteoarthritis, unspecified site: Secondary | ICD-10-CM | POA: Diagnosis not present

## 2018-07-13 DIAGNOSIS — Z96649 Presence of unspecified artificial hip joint: Secondary | ICD-10-CM | POA: Diagnosis not present

## 2018-07-13 DIAGNOSIS — Z7901 Long term (current) use of anticoagulants: Secondary | ICD-10-CM | POA: Diagnosis not present

## 2018-07-13 DIAGNOSIS — Z9181 History of falling: Secondary | ICD-10-CM | POA: Diagnosis not present

## 2018-07-16 ENCOUNTER — Other Ambulatory Visit: Payer: Self-pay

## 2018-07-16 ENCOUNTER — Ambulatory Visit (INDEPENDENT_AMBULATORY_CARE_PROVIDER_SITE_OTHER): Payer: Medicare Other | Admitting: *Deleted

## 2018-07-16 DIAGNOSIS — Z7901 Long term (current) use of anticoagulants: Secondary | ICD-10-CM | POA: Diagnosis not present

## 2018-07-16 DIAGNOSIS — I2699 Other pulmonary embolism without acute cor pulmonale: Secondary | ICD-10-CM | POA: Diagnosis not present

## 2018-07-16 DIAGNOSIS — Z5181 Encounter for therapeutic drug level monitoring: Secondary | ICD-10-CM | POA: Diagnosis not present

## 2018-07-16 LAB — POCT INR: INR: 1.7 — AB (ref 2.0–3.0)

## 2018-07-16 MED ORDER — WARFARIN SODIUM 5 MG PO TABS: ORAL_TABLET | ORAL | 2 refills | Status: DC

## 2018-07-16 NOTE — Patient Instructions (Signed)
Description   Today take 1 tablet and tomorrow take 1 tablet then continue to take 1/2 tablet daily except 1 tablet on Sundays and Thursdays. Recheck in 1 week. Please continue normal dark green leafy intake weekly.  Call with any new meds or procedures [phone].

## 2018-07-17 DIAGNOSIS — Z86711 Personal history of pulmonary embolism: Secondary | ICD-10-CM | POA: Diagnosis not present

## 2018-07-17 DIAGNOSIS — E1142 Type 2 diabetes mellitus with diabetic polyneuropathy: Secondary | ICD-10-CM | POA: Diagnosis not present

## 2018-07-17 DIAGNOSIS — Z9181 History of falling: Secondary | ICD-10-CM | POA: Diagnosis not present

## 2018-07-17 DIAGNOSIS — Z433 Encounter for attention to colostomy: Secondary | ICD-10-CM | POA: Diagnosis not present

## 2018-07-17 DIAGNOSIS — Z96649 Presence of unspecified artificial hip joint: Secondary | ICD-10-CM | POA: Diagnosis not present

## 2018-07-17 DIAGNOSIS — Z96652 Presence of left artificial knee joint: Secondary | ICD-10-CM | POA: Diagnosis not present

## 2018-07-17 DIAGNOSIS — I272 Pulmonary hypertension, unspecified: Secondary | ICD-10-CM | POA: Diagnosis not present

## 2018-07-17 DIAGNOSIS — E039 Hypothyroidism, unspecified: Secondary | ICD-10-CM | POA: Diagnosis not present

## 2018-07-17 DIAGNOSIS — S8002XD Contusion of left knee, subsequent encounter: Secondary | ICD-10-CM | POA: Diagnosis not present

## 2018-07-17 DIAGNOSIS — I110 Hypertensive heart disease with heart failure: Secondary | ICD-10-CM | POA: Diagnosis not present

## 2018-07-17 DIAGNOSIS — R296 Repeated falls: Secondary | ICD-10-CM | POA: Diagnosis not present

## 2018-07-17 DIAGNOSIS — M1990 Unspecified osteoarthritis, unspecified site: Secondary | ICD-10-CM | POA: Diagnosis not present

## 2018-07-17 DIAGNOSIS — I5032 Chronic diastolic (congestive) heart failure: Secondary | ICD-10-CM | POA: Diagnosis not present

## 2018-07-17 DIAGNOSIS — Z7901 Long term (current) use of anticoagulants: Secondary | ICD-10-CM | POA: Diagnosis not present

## 2018-07-17 DIAGNOSIS — W19XXXD Unspecified fall, subsequent encounter: Secondary | ICD-10-CM | POA: Diagnosis not present

## 2018-07-19 DIAGNOSIS — Z96649 Presence of unspecified artificial hip joint: Secondary | ICD-10-CM | POA: Diagnosis not present

## 2018-07-19 DIAGNOSIS — Z9181 History of falling: Secondary | ICD-10-CM | POA: Diagnosis not present

## 2018-07-19 DIAGNOSIS — M1990 Unspecified osteoarthritis, unspecified site: Secondary | ICD-10-CM | POA: Diagnosis not present

## 2018-07-19 DIAGNOSIS — Z86711 Personal history of pulmonary embolism: Secondary | ICD-10-CM | POA: Diagnosis not present

## 2018-07-19 DIAGNOSIS — W19XXXD Unspecified fall, subsequent encounter: Secondary | ICD-10-CM | POA: Diagnosis not present

## 2018-07-19 DIAGNOSIS — I110 Hypertensive heart disease with heart failure: Secondary | ICD-10-CM | POA: Diagnosis not present

## 2018-07-19 DIAGNOSIS — Z7901 Long term (current) use of anticoagulants: Secondary | ICD-10-CM | POA: Diagnosis not present

## 2018-07-19 DIAGNOSIS — I5032 Chronic diastolic (congestive) heart failure: Secondary | ICD-10-CM | POA: Diagnosis not present

## 2018-07-19 DIAGNOSIS — Z433 Encounter for attention to colostomy: Secondary | ICD-10-CM | POA: Diagnosis not present

## 2018-07-19 DIAGNOSIS — S8002XD Contusion of left knee, subsequent encounter: Secondary | ICD-10-CM | POA: Diagnosis not present

## 2018-07-19 DIAGNOSIS — E1142 Type 2 diabetes mellitus with diabetic polyneuropathy: Secondary | ICD-10-CM | POA: Diagnosis not present

## 2018-07-19 DIAGNOSIS — R296 Repeated falls: Secondary | ICD-10-CM | POA: Diagnosis not present

## 2018-07-19 DIAGNOSIS — I272 Pulmonary hypertension, unspecified: Secondary | ICD-10-CM | POA: Diagnosis not present

## 2018-07-19 DIAGNOSIS — E039 Hypothyroidism, unspecified: Secondary | ICD-10-CM | POA: Diagnosis not present

## 2018-07-19 DIAGNOSIS — Z96652 Presence of left artificial knee joint: Secondary | ICD-10-CM | POA: Diagnosis not present

## 2018-07-24 ENCOUNTER — Telehealth: Payer: Self-pay

## 2018-07-24 NOTE — Telephone Encounter (Signed)
Unable to lmom for prescreen busy signal

## 2018-07-26 ENCOUNTER — Other Ambulatory Visit: Payer: Self-pay

## 2018-07-26 ENCOUNTER — Ambulatory Visit (INDEPENDENT_AMBULATORY_CARE_PROVIDER_SITE_OTHER): Payer: Medicare Other | Admitting: *Deleted

## 2018-07-26 DIAGNOSIS — I2699 Other pulmonary embolism without acute cor pulmonale: Secondary | ICD-10-CM | POA: Diagnosis not present

## 2018-07-26 DIAGNOSIS — Z7901 Long term (current) use of anticoagulants: Secondary | ICD-10-CM | POA: Diagnosis not present

## 2018-07-26 DIAGNOSIS — Z5181 Encounter for therapeutic drug level monitoring: Secondary | ICD-10-CM

## 2018-07-26 LAB — POCT INR: INR: 2.5 (ref 2.0–3.0)

## 2018-07-26 NOTE — Patient Instructions (Addendum)
Description    Continue to take 1/2 tablet daily except 1 tablet on Sundays and Thursdays. Recheck in 2 week. Please continue normal dark green leafy intake weekly.  Call with any new meds or procedures [phone].

## 2018-08-03 ENCOUNTER — Other Ambulatory Visit: Payer: Self-pay | Admitting: Family Medicine

## 2018-08-10 ENCOUNTER — Other Ambulatory Visit: Payer: Self-pay

## 2018-08-10 ENCOUNTER — Ambulatory Visit (INDEPENDENT_AMBULATORY_CARE_PROVIDER_SITE_OTHER): Payer: Medicare Other | Admitting: *Deleted

## 2018-08-10 DIAGNOSIS — Z7901 Long term (current) use of anticoagulants: Secondary | ICD-10-CM

## 2018-08-10 DIAGNOSIS — I2699 Other pulmonary embolism without acute cor pulmonale: Secondary | ICD-10-CM

## 2018-08-10 DIAGNOSIS — Z5181 Encounter for therapeutic drug level monitoring: Secondary | ICD-10-CM | POA: Diagnosis not present

## 2018-08-10 LAB — POCT INR: INR: 2.3 (ref 2.0–3.0)

## 2018-08-10 NOTE — Patient Instructions (Signed)
Description   Today take 1 tablet then continue to take 1/2 tablet daily except 1 tablet on Sundays and Thursdays. Recheck in 2 weeks. Please continue normal dark green leafy intake weekly.  Call with any new meds or procedures [phone].

## 2018-08-24 ENCOUNTER — Ambulatory Visit (INDEPENDENT_AMBULATORY_CARE_PROVIDER_SITE_OTHER): Payer: Medicare Other | Admitting: *Deleted

## 2018-08-24 ENCOUNTER — Other Ambulatory Visit: Payer: Self-pay

## 2018-08-24 DIAGNOSIS — Z5181 Encounter for therapeutic drug level monitoring: Secondary | ICD-10-CM

## 2018-08-24 DIAGNOSIS — I2699 Other pulmonary embolism without acute cor pulmonale: Secondary | ICD-10-CM

## 2018-08-24 DIAGNOSIS — Z7901 Long term (current) use of anticoagulants: Secondary | ICD-10-CM

## 2018-08-24 LAB — POCT INR: INR: 1.8 — AB (ref 2.0–3.0)

## 2018-08-24 NOTE — Patient Instructions (Addendum)
Description   Take 1 tablet today and tomorrow, then start taking 1/2 tablet daily except 1 tablet on Sundays, Tuesday and Thursdays. Recheck in 1 week.   Call with any new meds or procedures [phone].

## 2018-08-31 ENCOUNTER — Other Ambulatory Visit: Payer: Self-pay

## 2018-08-31 ENCOUNTER — Ambulatory Visit (INDEPENDENT_AMBULATORY_CARE_PROVIDER_SITE_OTHER): Payer: Medicare Other | Admitting: *Deleted

## 2018-08-31 DIAGNOSIS — Z5181 Encounter for therapeutic drug level monitoring: Secondary | ICD-10-CM | POA: Diagnosis not present

## 2018-08-31 DIAGNOSIS — I2699 Other pulmonary embolism without acute cor pulmonale: Secondary | ICD-10-CM

## 2018-08-31 DIAGNOSIS — Z7901 Long term (current) use of anticoagulants: Secondary | ICD-10-CM | POA: Diagnosis not present

## 2018-08-31 LAB — POCT INR: INR: 3.2 — AB (ref 2.0–3.0)

## 2018-08-31 NOTE — Patient Instructions (Signed)
Description   Continue taking 1/2 tablet daily except 1 tablet on Sundays, Tuesday and Thursdays. Recheck in 2 weeks.   Call with any new meds or procedures [phone].

## 2018-09-04 ENCOUNTER — Other Ambulatory Visit: Payer: Self-pay | Admitting: Family Medicine

## 2018-09-04 DIAGNOSIS — E1169 Type 2 diabetes mellitus with other specified complication: Secondary | ICD-10-CM

## 2018-09-12 ENCOUNTER — Other Ambulatory Visit: Payer: Self-pay

## 2018-09-12 ENCOUNTER — Ambulatory Visit (INDEPENDENT_AMBULATORY_CARE_PROVIDER_SITE_OTHER): Payer: Medicare Other | Admitting: Family Medicine

## 2018-09-12 ENCOUNTER — Encounter: Payer: Self-pay | Admitting: Family Medicine

## 2018-09-12 VITALS — BP 160/100 | HR 94 | Temp 97.4°F | Ht 67.0 in | Wt 157.0 lb

## 2018-09-12 DIAGNOSIS — E785 Hyperlipidemia, unspecified: Secondary | ICD-10-CM

## 2018-09-12 DIAGNOSIS — E1169 Type 2 diabetes mellitus with other specified complication: Secondary | ICD-10-CM | POA: Diagnosis not present

## 2018-09-12 DIAGNOSIS — I10 Essential (primary) hypertension: Secondary | ICD-10-CM | POA: Diagnosis not present

## 2018-09-12 DIAGNOSIS — E1142 Type 2 diabetes mellitus with diabetic polyneuropathy: Secondary | ICD-10-CM | POA: Diagnosis not present

## 2018-09-12 DIAGNOSIS — Z23 Encounter for immunization: Secondary | ICD-10-CM

## 2018-09-12 DIAGNOSIS — E538 Deficiency of other specified B group vitamins: Secondary | ICD-10-CM | POA: Diagnosis not present

## 2018-09-12 LAB — FOLATE: Folate: 8.4 ng/mL (ref >5.9)

## 2018-09-12 LAB — MICROALBUMIN / CREATININE URINE RATIO
Creatinine,U: 137.1 mg/dL
Microalb Creat Ratio: 3.1 mg/g (ref 0.0–30.0)
Microalb, Ur: 4.3 mg/dL — ABNORMAL HIGH (ref 0.0–1.9)

## 2018-09-12 LAB — HEMOGLOBIN A1C: Hgb A1c MFr Bld: 6.1 % (ref 4.6–6.5)

## 2018-09-12 LAB — VITAMIN B12: Vitamin B-12: 181 pg/mL — ABNORMAL LOW (ref 211–911)

## 2018-09-12 MED ORDER — SIMVASTATIN 20 MG PO TABS: 20.0000 mg | ORAL_TABLET | Freq: Every day | ORAL | 1 refills | Status: DC

## 2018-09-12 MED ORDER — LISINOPRIL 40 MG PO TABS: 40.0000 mg | ORAL_TABLET | Freq: Every day | ORAL | 1 refills | Status: DC

## 2018-09-12 MED ORDER — METFORMIN HCL 1000 MG PO TABS: ORAL_TABLET | ORAL | 1 refills | Status: DC

## 2018-09-12 MED ORDER — METOPROLOL TARTRATE 25 MG PO TABS: 25.0000 mg | ORAL_TABLET | Freq: Two times a day (BID) | ORAL | 1 refills | Status: DC

## 2018-09-12 NOTE — Progress Notes (Signed)
Hannah [name] DOB: Jan 24, 1940 Encounter date: 09/12/2018  This is a 77 y.o. female who presents with Chief Complaint  Patient presents with  . Follow-up    History of present illness: Here by herself today (grandson in lobby). Just found out about person that she knows that was found deceased without cause. She thinks this is affecting her bp today. She does check blood pressure whenever she can at home: was getting 80/40, 100/70. Doesn't feel light headed or different when pressures are that low. Hasn't had any at home as high as today.   Following with cardio: last visit in 06/2018.  Last visit with me was 06/08/18 and she was at home s/p ER visit for fall (initial ER follow up 5/20 and she was bed ridden at that time): she then completed PT/OT  When asked about weight decrease: states she isn't sure why it decreased. She states that she is well fed, daughters and grandchildren bring her food. Appetite is decreased some.   In general mood has been good. Grandson's wife is pregnant and due in December so she is really excited about this - having a girl.   HTN:see above HF: on lasix. Tolerating this pretty well.  HL: on simvastatin 31m daily  Hx of PE on coumadin: follows w coumadin clinic.   DMII: sugar is always good. She didn't bring machine. Checks in morning before eating. Usually getting numbers in low 100's. No problems with low sugars or symptomatic for low sugars.   Weakness: Feels good strength wise. Therapist wants her to walk with walker and she doesn't like it, but is following rules and using this even in house. She did complete therapy with occupational and physical and did complete nurse home visits. She is back at home now (was staying with daughters previously). She is doing exercises on her own. Missing exercise class she used to do at church. Does leg, arm exercises daily.   Shakiness in hands is better. She uses a little writing tool which seems to have helped with hand  shakiness.     Allergies  Allergen Reactions  . Augmentin [Amoxicillin-Pot Clavulanate] Itching and Other (See Comments)    Severe vaginal itching  . Tranxene [Clorazepate] Itching  . Penicillins Itching and Rash    Has patient had a PCN reaction causing immediate rash, facial/tongue/throat swelling, SOB or lightheadedness with hypotension: Yes Has patient had a PCN reaction causing severe rash involving mucus membranes or skin necrosis: No Has patient had a PCN reaction that required hospitalization: No Has patient had a PCN reaction occurring within the last 10 years: No If all of the above answers are "NO", then may proceed with Cephalosporin use.    Current Meds  Medication Sig  . acetaminophen (TYLENOL) 500 MG tablet Take 500 mg by mouth every 6 (six) hours as needed for mild pain.   . Blood Glucose Monitoring Suppl (ONETOUCH VERIO) w/Device KIT 1 each by Does not apply route as directed.  . furosemide (LASIX) 20 MG tablet Take 1 tablet (20 mg total) by mouth daily as needed for fluid or edema.  .Marland Kitchenglucose blood (ONETOUCH VERIO) test strip Use as instructed once a day  . glucose blood test strip Use as instructed once a day  . levothyroxine (SYNTHROID) 25 MCG tablet TAKE 1 TABLET BY MOUTH ONCE DAILY BEFORE BREAKFAST  . lisinopril (ZESTRIL) 40 MG tablet Take 1 tablet (40 mg total) by mouth daily.  . metFORMIN (GLUCOPHAGE) 1000 MG tablet TAKE ONE TABLET BY  MOUTH WITH BREAKFAST AND TAKE ONE TABLET WITH DINNER  . metoprolol tartrate (LOPRESSOR) 25 MG tablet Take 1 tablet (25 mg total) by mouth 2 (two) times daily.  [name] Delica Lancets 40J MISC USE 1  TO CHECK GLUCOSE ONCE DAILY  . polyethylene glycol (MIRALAX / GLYCOLAX) packet Take 17 g by mouth daily. (Patient taking differently: Take 17 g by mouth every other day. )  . simvastatin (ZOCOR) 20 MG tablet Take 1 tablet (20 mg total) by mouth at bedtime.  . vitamin C (ASCORBIC ACID) 500 MG tablet Take 500 mg by mouth daily.   Marland Kitchen  warfarin (COUMADIN) 5 MG tablet Take 1/2 or 1 tablet daily as directed by Coumadin clinic  . [DISCONTINUED] lisinopril (ZESTRIL) 40 MG tablet Take 1 tablet by mouth once daily  . [DISCONTINUED] metFORMIN (GLUCOPHAGE) 1000 MG tablet TAKE ONE TABLET BY MOUTH WITH BREAKFAST AND TAKE ONE TABLET WITH DINNER  . [DISCONTINUED] metoprolol tartrate (LOPRESSOR) 25 MG tablet Take 1 tablet (25 mg total) by mouth 2 (two) times daily.  . [DISCONTINUED] simvastatin (ZOCOR) 20 MG tablet TAKE 1 TABLET BY MOUTH AT BEDTIME    Review of Systems  Constitutional: Negative for chills, fatigue and fever.  Respiratory: Negative for cough, chest tightness, shortness of breath and wheezing.   Cardiovascular: Negative for chest pain, palpitations and leg swelling.  Neurological: Negative for dizziness, weakness and headaches. Tremors: sometimes right hand slight.    Objective:  BP (!) 160/100 (BP Location: Left Arm, Patient Position: Sitting, Cuff Size: Large)   Pulse 94   Temp (!) 97.4 F (36.3 C) (Temporal)   Ht _0  (1.702 m)   Wt 157 lb (71.2 kg)   SpO2 98%   BMI 24.59 kg/m   Weight: 157 lb (71.2 kg)   BP Readings from Last 3 Encounters:  09/12/18 (!) 160/100  05/21/18 116/64  12/19/17 122/80   Wt Readings from Last 3 Encounters:  09/12/18 157 lb (71.2 kg)  12/19/17 164 lb 6.4 oz (74.6 kg)  12/17/17 172 lb 0.1 oz (78 kg)  *my recheck of pressure was higher at 190/104  Physical Exam Constitutional:      General: She is not in acute distress.    Appearance: She is well-developed.  Cardiovascular:     Rate and Rhythm: Normal rate and regular rhythm.     Heart sounds: Normal heart sounds. No murmur. No friction rub.  Pulmonary:     Effort: Pulmonary effort is normal. No respiratory distress.     Breath sounds: Normal breath sounds. No wheezing or rales.  Musculoskeletal:     Right lower leg: No edema.     Left lower leg: No edema.  Skin:    Comments: Nails thickened, long bilat toes. No skin  lesions. Sensation on feet normal with monofilament testing.   Neurological:     Mental Status: She is alert and oriented to person, place, and time.     Comments: She is using walker to walk, but she is able to stand from chair (uses arms to help) in under 3 seconds, She can lift and move all extremities independently.   Psychiatric:        Behavior: Behavior normal.    Depression screen [facility] 2/9 09/13/2018 10/28/2016 04/10/2015 08/12/2014 02/25/2013  Decreased Interest 0 0 0 0 0  Down, Depressed, Hopeless 0 0 0 0 0  PHQ - 2 Score 0 0 0 0 0  Some recent data might be hidden     Assessment/Plan  1. Hyperlipidemia associated with type 2 diabetes mellitus (HCC) - simvastatin (ZOCOR) 20 MG tablet; Take 1 tablet (20 mg total) by mouth at bedtime.  Dispense: 90 tablet; Refill: 1  2. Essential hypertension Blood pressure in office today is elevated even on recheck.  I am hesitant to increase any blood pressure medication since home readings have been so low.  I encouraged her to check with a different cuff at home, which she states she has access to.  I will follow-up with her and see how her blood pressures are running once we get blood work results. - metoprolol tartrate (LOPRESSOR) 25 MG tablet; Take 1 tablet (25 mg total) by mouth 2 (two) times daily.  Dispense: 180 tablet; Refill: 1  3. Type 2 diabetes mellitus with diabetic polyneuropathy, without long-term current use of insulin (HCC) Blood sugars have been well controlled.  Could consider decreasing metformin.  She saw podiatrist twice and nails were not trimmed.  We called [facility] today and they stated there were 2 providers there that did trim nails.  I am not certain why this was not completed for her since this was the purpose of her visit previously, but asked her to call back and make a follow-up appointment asking specifically for a provider that would trim her nails.  She quit going to nail salon's due to test for infection, so  we discussed that podiatry is the safest place to have nails trimmed regularly. - Hemoglobin A1c; Future - Microalbumin / creatinine urine ratio; Future - metFORMIN (GLUCOPHAGE) 1000 MG tablet; TAKE ONE TABLET BY MOUTH WITH BREAKFAST AND TAKE ONE TABLET WITH DINNER  Dispense: 180 tablet; Refill: 1 - Hemoglobin A1c - Microalbumin / creatinine urine ratio  4. B12 deficiency She has not been taking a B12 supplement.  We will recheck levels.  I am not sure if this is something she was just doing at her daughter's house when she was under her care and that did not carryover when she went to have her own house.  Pending levels we will further advise. - Vitamin B12; Future - Folate; Future - Folate - Vitamin B12  5. Need for immunization against influenza  - Flu Vaccine QUAD High Dose(Fluad)    Return in about 3 months (around 12/12/2018) for Chronic condition visit.    Micheline Rough, MD

## 2018-09-12 NOTE — Patient Instructions (Addendum)
Restart vitamin B12 (1071mcg) by mouth (get the tablet that dissolves under tongue). Take this daily.   I will call you when I get lab results from today. I would like to see you back in December to recheck your weight.   Your blood pressure was high in the office today. Please check at home twice daily until you hear from me Friday with lab results.   Call back [facility] and ask for provider that trims nails. There are 2 that do it there.

## 2018-09-14 ENCOUNTER — Other Ambulatory Visit: Payer: Self-pay

## 2018-09-14 ENCOUNTER — Telehealth: Payer: Self-pay | Admitting: *Deleted

## 2018-09-14 ENCOUNTER — Ambulatory Visit (INDEPENDENT_AMBULATORY_CARE_PROVIDER_SITE_OTHER): Payer: Medicare Other | Admitting: *Deleted

## 2018-09-14 DIAGNOSIS — Z7901 Long term (current) use of anticoagulants: Secondary | ICD-10-CM | POA: Diagnosis not present

## 2018-09-14 DIAGNOSIS — I2699 Other pulmonary embolism without acute cor pulmonale: Secondary | ICD-10-CM

## 2018-09-14 DIAGNOSIS — Z5181 Encounter for therapeutic drug level monitoring: Secondary | ICD-10-CM

## 2018-09-14 LAB — POCT INR: INR: 2.4 (ref 2.0–3.0)

## 2018-09-14 NOTE — Telephone Encounter (Signed)
Hannah [name] called back, stated she is at work and stated she is not sure what the BP readings were as her sister was checking this at home.  Stated she will call back and leave a detailed message with the numbers.

## 2018-09-14 NOTE — Telephone Encounter (Signed)
-----   Message from [name], MD sent at 09/13/2018  1:05 PM EDT ----- Can you see how her blood pressures are doing at home? In office was very high. I wanted her to check at home and get back to me about this. Previous home readings were very low so we had big discrepancy.

## 2018-09-14 NOTE — Patient Instructions (Signed)
Description   Take 1 tablet today, then continue taking 1/2 tablet daily except 1 tablet on Sundays, Tuesday and Thursdays. Recheck in 2 weeks.   Call with any new meds or procedures [phone].

## 2018-09-14 NOTE — Telephone Encounter (Signed)
-----   Message from Junell C Koberlein, MD sent at 09/13/2018  1:05 PM EDT ----- Can you see how her blood pressures are doing at home? In office was very high. I wanted her to check at home and get back to me about this. Previous home readings were very low so we had big discrepancy. 

## 2018-09-14 NOTE — Telephone Encounter (Signed)
Left a message for the pts daughter to return my call.  CRM also created.  

## 2018-09-20 NOTE — Telephone Encounter (Signed)
Spoke with Hannah [name] today and she stated the patients BP has been better, she forgot to call back with numbers and will do so later.  Message forwarded to Dr Ethlyn Gallery.

## 2018-09-21 MED ORDER — AMLODIPINE BESYLATE 2.5 MG PO TABS: 2.5000 mg | ORAL_TABLET | Freq: Every day | ORAL | 2 refills | Status: DC

## 2018-09-21 NOTE — Telephone Encounter (Signed)
She has cardiology appointment next week. Have them keep recording bp from home and bring cuff with them and recordings to cardiology appointment. She will likely need additional medication, but since appointment is so close, let's have them do recheck in office and address.

## 2018-09-21 NOTE — Telephone Encounter (Signed)
Pt's daughter, Sharyn Lull, calling back:  09/13/2018:  152/85, pulse 65 09/14/2018:  127/85, pulse 65 09/16/2018:  161/99, pulse 75 09/18/2018:  146/96, pulse 61 09/19/2018:  159/96, pulse 68 09/20/2018:  137/88, no pulse given

## 2018-09-21 NOTE — Telephone Encounter (Signed)
Sorry! Ok. In that case let's add amlodipine 2.5mg  at bedtime to medications. Please send in 30 days with 2 refills. I would like her to have 1 month blood pressure follow up visit. Biggest side effect of amlodipine is swelling in legs; this usually happens with higher doses.

## 2018-09-21 NOTE — Telephone Encounter (Signed)
Called [employer] and informed her of the message below.  She is aware the Rx was sent to the pharmacy and a follow up appt was scheduled for 10/21.

## 2018-09-21 NOTE — Telephone Encounter (Signed)
Called the pts daughter and informed her of the message below.  She stated the appt on 9/25 is not with the cardiologist, only for the Coumadin clinic.  Message sent to Dr Ethlyn Gallery.

## 2018-09-21 NOTE — Telephone Encounter (Signed)
Noted. Thanks.

## 2018-09-28 ENCOUNTER — Ambulatory Visit (INDEPENDENT_AMBULATORY_CARE_PROVIDER_SITE_OTHER): Payer: Medicare Other | Admitting: *Deleted

## 2018-09-28 ENCOUNTER — Other Ambulatory Visit: Payer: Self-pay

## 2018-09-28 DIAGNOSIS — Z5181 Encounter for therapeutic drug level monitoring: Secondary | ICD-10-CM

## 2018-09-28 DIAGNOSIS — Z7901 Long term (current) use of anticoagulants: Secondary | ICD-10-CM | POA: Diagnosis not present

## 2018-09-28 DIAGNOSIS — I2699 Other pulmonary embolism without acute cor pulmonale: Secondary | ICD-10-CM

## 2018-09-28 LAB — POCT INR: INR: 3.5 — AB (ref 2.0–3.0)

## 2018-09-28 NOTE — Patient Instructions (Signed)
Description   Continue taking 1/2 tablet daily except 1 tablet on Sundays, Tuesday and Thursdays. Recheck in 3 weeks.  Call with any new meds or procedures [phone].

## 2018-10-04 DIAGNOSIS — R55 Syncope and collapse: Secondary | ICD-10-CM

## 2018-10-04 HISTORY — DX: Syncope and collapse: R55

## 2018-10-12 DIAGNOSIS — K56609 Unspecified intestinal obstruction, unspecified as to partial versus complete obstruction: Secondary | ICD-10-CM | POA: Diagnosis not present

## 2018-10-12 DIAGNOSIS — Z4801 Encounter for change or removal of surgical wound dressing: Secondary | ICD-10-CM | POA: Diagnosis not present

## 2018-10-12 DIAGNOSIS — E119 Type 2 diabetes mellitus without complications: Secondary | ICD-10-CM | POA: Diagnosis not present

## 2018-10-12 DIAGNOSIS — Z933 Colostomy status: Secondary | ICD-10-CM | POA: Diagnosis not present

## 2018-10-19 ENCOUNTER — Ambulatory Visit (INDEPENDENT_AMBULATORY_CARE_PROVIDER_SITE_OTHER): Payer: Medicare Other | Admitting: *Deleted

## 2018-10-19 ENCOUNTER — Other Ambulatory Visit: Payer: Self-pay

## 2018-10-19 DIAGNOSIS — Z5181 Encounter for therapeutic drug level monitoring: Secondary | ICD-10-CM

## 2018-10-19 DIAGNOSIS — Z7901 Long term (current) use of anticoagulants: Secondary | ICD-10-CM

## 2018-10-19 DIAGNOSIS — I2699 Other pulmonary embolism without acute cor pulmonale: Secondary | ICD-10-CM

## 2018-10-19 LAB — POCT INR: INR: 2.9 (ref 2.0–3.0)

## 2018-10-19 NOTE — Patient Instructions (Signed)
Description   Continue taking 1/2 tablet daily except 1 tablet on Sundays, Tuesday and Thursdays. Recheck in 4 weeks.  Call with any new meds or procedures [phone].

## 2018-10-21 ENCOUNTER — Encounter (HOSPITAL_COMMUNITY): Payer: Self-pay | Admitting: Emergency Medicine

## 2018-10-21 ENCOUNTER — Emergency Department (HOSPITAL_COMMUNITY): Payer: Medicare Other

## 2018-10-21 ENCOUNTER — Other Ambulatory Visit: Payer: Self-pay

## 2018-10-21 ENCOUNTER — Emergency Department (HOSPITAL_COMMUNITY)
Admission: EM | Admit: 2018-10-21 | Discharge: 2018-10-21 | Disposition: A | Discharge status: Home / Self Care | Payer: Medicare Other | Attending: Emergency Medicine | Admitting: Emergency Medicine

## 2018-10-21 DIAGNOSIS — Z79899 Other long term (current) drug therapy: Secondary | ICD-10-CM | POA: Insufficient documentation

## 2018-10-21 DIAGNOSIS — R4182 Altered mental status, unspecified: Secondary | ICD-10-CM | POA: Diagnosis not present

## 2018-10-21 DIAGNOSIS — I5032 Chronic diastolic (congestive) heart failure: Secondary | ICD-10-CM | POA: Diagnosis not present

## 2018-10-21 DIAGNOSIS — R55 Syncope and collapse: Secondary | ICD-10-CM

## 2018-10-21 DIAGNOSIS — E119 Type 2 diabetes mellitus without complications: Secondary | ICD-10-CM | POA: Insufficient documentation

## 2018-10-21 DIAGNOSIS — Z7984 Long term (current) use of oral hypoglycemic drugs: Secondary | ICD-10-CM | POA: Diagnosis not present

## 2018-10-21 DIAGNOSIS — E039 Hypothyroidism, unspecified: Secondary | ICD-10-CM | POA: Diagnosis not present

## 2018-10-21 DIAGNOSIS — I110 Hypertensive heart disease with heart failure: Secondary | ICD-10-CM | POA: Insufficient documentation

## 2018-10-21 DIAGNOSIS — Z87891 Personal history of nicotine dependence: Secondary | ICD-10-CM | POA: Diagnosis not present

## 2018-10-21 DIAGNOSIS — I447 Left bundle-branch block, unspecified: Secondary | ICD-10-CM | POA: Diagnosis not present

## 2018-10-21 DIAGNOSIS — R110 Nausea: Secondary | ICD-10-CM | POA: Diagnosis not present

## 2018-10-21 DIAGNOSIS — I10 Essential (primary) hypertension: Secondary | ICD-10-CM | POA: Diagnosis not present

## 2018-10-21 DIAGNOSIS — S4991XA Unspecified injury of right shoulder and upper arm, initial encounter: Secondary | ICD-10-CM | POA: Diagnosis not present

## 2018-10-21 LAB — CBC WITH DIFFERENTIAL/PLATELET
Abs Immature Granulocytes: 0.02 10*3/uL (ref 0.00–0.07)
Basophils Absolute: 0.1 10*3/uL (ref 0.0–0.1)
Basophils Relative: 1 %
Eosinophils Absolute: 0.1 10*3/uL (ref 0.0–0.5)
Eosinophils Relative: 1 %
HCT: 40.6 % (ref 36.0–46.0)
Hemoglobin: 12.9 g/dL (ref 12.0–15.0)
Immature Granulocytes: 0 %
Lymphocytes Relative: 29 %
Lymphs Abs: 2.6 10*3/uL (ref 0.7–4.0)
MCH: 29.5 pg (ref 26.0–34.0)
MCHC: 31.8 g/dL (ref 30.0–36.0)
MCV: 92.9 fL (ref 80.0–100.0)
Monocytes Absolute: 0.5 10*3/uL (ref 0.1–1.0)
Monocytes Relative: 6 %
Neutro Abs: 5.7 10*3/uL (ref 1.7–7.7)
Neutrophils Relative %: 63 %
Platelets: 242 10*3/uL (ref 150–400)
RBC: 4.37 MIL/uL (ref 3.87–5.11)
RDW: 14.8 % (ref 11.5–15.5)
WBC: 9 10*3/uL (ref 4.0–10.5)
nRBC: 0 % (ref 0.0–0.2)

## 2018-10-21 LAB — COMPREHENSIVE METABOLIC PANEL
ALT: 10 U/L (ref 0–44)
AST: 17 U/L (ref 15–41)
Albumin: 3.3 g/dL — ABNORMAL LOW (ref 3.5–5.0)
Alkaline Phosphatase: 41 U/L (ref 38–126)
Anion gap: 11 (ref 5–15)
BUN: 18 mg/dL (ref 8–23)
CO2: 20 mmol/L — ABNORMAL LOW (ref 22–32)
Calcium: 9.2 mg/dL (ref 8.9–10.3)
Chloride: 108 mmol/L (ref 98–111)
Creatinine, Ser: 1.36 mg/dL — ABNORMAL HIGH (ref 0.44–1.00)
GFR calc Af Amer: 43 mL/min — ABNORMAL LOW (ref >60)
GFR calc non Af Amer: 37 mL/min — ABNORMAL LOW (ref >60)
Glucose, Bld: 125 mg/dL — ABNORMAL HIGH (ref 70–99)
Potassium: 4.3 mmol/L (ref 3.5–5.1)
Sodium: 139 mmol/L (ref 135–145)
Total Bilirubin: 0.5 mg/dL (ref 0.3–1.2)
Total Protein: 6 g/dL — ABNORMAL LOW (ref 6.5–8.1)

## 2018-10-21 LAB — PROTIME-INR
INR: 2.4 — ABNORMAL HIGH (ref 0.8–1.2)
Prothrombin Time: 25.7 seconds — ABNORMAL HIGH (ref 11.4–15.2)

## 2018-10-21 LAB — TROPONIN I (HIGH SENSITIVITY)
Troponin I (High Sensitivity): 4 ng/L (ref <18)
Troponin I (High Sensitivity): 5 ng/L (ref <18)

## 2018-10-21 LAB — CBG MONITORING, ED: Glucose-Capillary: 141 mg/dL — ABNORMAL HIGH (ref 70–99)

## 2018-10-21 MED ORDER — SODIUM CHLORIDE 0.9 % IV BOLUS
1000.0000 mL | Freq: Once | INTRAVENOUS | Status: AC
  Administered 2018-10-21: 1000 mL via INTRAVENOUS

## 2018-10-21 MED ORDER — ONDANSETRON HCL 4 MG/2ML IJ SOLN
4.0000 mg | Freq: Once | INTRAMUSCULAR | Status: AC
  Administered 2018-10-21: 4 mg via INTRAVENOUS
  Filled 2018-10-21: qty 2

## 2018-10-21 NOTE — Progress Notes (Signed)
Jeffrey Wiegert DOB: 07/29/1940 Encounter date: 10/22/2018  This is a 78 y.o. female who presents with Chief Complaint  Patient presents with  . Follow-up    History of present illness: Last visit was 09/2018. We discussed elevated blood pressure (she thought related to bad news just prior to visit) and decreased appetite. bp at home were elevated so we had added amlodipien 2.5mg. She started feeling dizzy when she took this - was needing to sit with walker but makes her tired and dizzy. Taking about every other day.   Was in ER yesterday. Had near-syncope at dinner. Had this same thing in may and then it was followed by fall. She is tired today from sitting in ER so long yesterday. Had been with daughter in days prior. They were almost done with meal when episode happened. Called paramedics: bp was high; EKG in ambulance and suggested hospital evaluation since this was second episode in a few months. But was told that EKG was normal.   157/87 hr 63 (last week at home).   Doc in ER referred to neurology.   Appetite is poor. Just feels like she is full.   Allergies  Allergen Reactions  . Augmentin [Amoxicillin-Pot Clavulanate] Itching and Other (See Comments)    Severe vaginal itching  . Tranxene [Clorazepate] Itching  . Penicillins Itching and Rash    Has patient had a PCN reaction causing immediate rash, facial/tongue/throat swelling, SOB or lightheadedness with hypotension: Yes Has patient had a PCN reaction causing severe rash involving mucus membranes or skin necrosis: No Has patient had a PCN reaction that required hospitalization: No Has patient had a PCN reaction occurring within the last 10 years: No If all of the above answers are "NO", then may proceed with Cephalosporin use.    Current Meds  Medication Sig  . acetaminophen (TYLENOL) 500 MG tablet Take 500 mg by mouth every 6 (six) hours as needed for mild pain.   . amLODipine (NORVASC) 2.5 MG tablet Take 1 tablet (2.5 mg  total) by mouth at bedtime.  . Blood Glucose Monitoring Suppl (ONETOUCH VERIO) w/Device KIT 1 each by Does not apply route as directed.  . furosemide (LASIX) 20 MG tablet Take 1 tablet (20 mg total) by mouth daily as needed for fluid or edema.  . glucose blood (ONETOUCH VERIO) test strip Use as instructed once a day  . glucose blood test strip Use as instructed once a day  . levothyroxine (SYNTHROID) 25 MCG tablet TAKE 1 TABLET BY MOUTH ONCE DAILY BEFORE BREAKFAST  . lisinopril (ZESTRIL) 40 MG tablet Take 1 tablet (40 mg total) by mouth daily.  . metFORMIN (GLUCOPHAGE) 1000 MG tablet TAKE ONE TABLET BY MOUTH WITH BREAKFAST AND TAKE ONE TABLET WITH DINNER  . metoprolol tartrate (LOPRESSOR) 25 MG tablet Take 1 tablet (25 mg total) by mouth 2 (two) times daily.  . OneTouch Delica Lancets 30G MISC USE 1  TO CHECK GLUCOSE ONCE DAILY  . polyethylene glycol (MIRALAX / GLYCOLAX) packet Take 17 g by mouth daily. (Patient taking differently: Take 17 g by mouth every other day. )  . simvastatin (ZOCOR) 20 MG tablet Take 1 tablet (20 mg total) by mouth at bedtime.  . vitamin C (ASCORBIC ACID) 500 MG tablet Take 500 mg by mouth daily.   . warfarin (COUMADIN) 5 MG tablet Take 1/2 or 1 tablet daily as directed by Coumadin clinic    Review of Systems  Constitutional: Positive for appetite change (decreased) and fatigue. Negative for   chills and fever.  Respiratory: Negative for cough, chest tightness, shortness of breath and wheezing.   Cardiovascular: Negative for chest pain, palpitations and leg swelling.  Gastrointestinal: Negative for abdominal pain, diarrhea, nausea and vomiting.  Neurological: Positive for weakness. Negative for dizziness and light-headedness.    Objective:  BP (!) 160/90 (BP Location: Left Arm, Patient Position: Sitting, Cuff Size: Normal)   Pulse 69   Temp (!) 97.3 F (36.3 C) (Temporal)   Ht 5' 7" (1.702 m)   Wt 154 lb 11.2 oz (70.2 kg)   SpO2 98%   BMI 24.23 kg/m   Weight:  154 lb 11.2 oz (70.2 kg)   BP Readings from Last 3 Encounters:  10/22/18 (!) 160/90  10/21/18 140/75  09/12/18 (!) 160/100   Wt Readings from Last 3 Encounters:  10/22/18 154 lb 11.2 oz (70.2 kg)  10/21/18 167 lb (75.8 kg)  09/12/18 157 lb (71.2 kg)    Physical Exam Constitutional:      General: She is not in acute distress.    Appearance: She is well-developed.  Cardiovascular:     Rate and Rhythm: Normal rate and regular rhythm.     Heart sounds: Normal heart sounds. No murmur. No friction rub.  Pulmonary:     Effort: Pulmonary effort is normal. No respiratory distress.     Breath sounds: Normal breath sounds. No wheezing or rales.  Musculoskeletal:     Right lower leg: No edema.     Left lower leg: No edema.  Neurological:     Mental Status: She is alert and oriented to person, place, and time.     Cranial Nerves: Cranial nerves are intact.     Motor: Weakness and tremor present. No abnormal muscle tone or pronator drift.     Coordination: Romberg sign negative.     Deep Tendon Reflexes:     Reflex Scores:      Tricep reflexes are 2+ on the right side and 2+ on the left side.      Bicep reflexes are 2+ on the right side and 2+ on the left side.      Brachioradialis reflexes are 2+ on the right side and 2+ on the left side.      Patellar reflexes are 2+ on the right side and 2+ on the left side.      Achilles reflexes are 2+ on the right side and 2+ on the left side.    Comments: 4 out of 5 hip flexor strength bilateral, 4+ out of 5 strength with knee flexion and extension.  4+ out of 5 strength with shoulder abduction, 4+ out of 5 tricep and bicep testing bilateral.  Intention tremor right hand makes right-sided finger-to-nose testing more difficult.  Responses somewhat slowed today.  Psychiatric:        Behavior: Behavior normal.     Assessment/Plan  1. Weakness We will order home therapy.  We discussed importance of adequate protein intake and options for ways to  get this.  She does like protein bars which she was previously drained but is stopped.  We discussed that she has to maintain adequate protein in order to work on strength. - Ambulatory referral to Physical Therapy - Face-to-face encounter (required for Medicare/Medicaid patients)  2. Essential hypertension Blood pressure is elevated today.  She was feeling unwell with taking the amlodipine.  I have advised her to cut this in half and try half a tablet every night.  I am worried that the  intermittent dosing she is doing is not keeping blood pressure well controlled.  Please let me know if still continuing to feel side effects with this medication. - Face-to-face encounter (required for Medicare/Medicaid patients)  3. Chronic diastolic CHF (congestive heart failure) (HCC) - Face-to-face encounter (required for Medicare/Medicaid patients)  4. Type 2 diabetes mellitus with diabetic polyneuropathy, without long-term current use of insulin (HCC) Blood sugars have been stable. - Face-to-face encounter (required for Medicare/Medicaid patients)  5. B12 deficiency Continue oral supplementation. - Face-to-face encounter (required for Medicare/Medicaid patients)  Return for pending update by mychart.  Patient instructions: Try breaking that amlodipine 2.5mg in half at bedtime. See how you do with this.   Work on adding in protein/calories to your day. Consider Glucerna or similar supplement. Protein bars. Consider greek yogurt smoothies. Consider fairlife milk.   Update me in 3-4 weeks about how eating is going and how blood pressures are doing.   Let me know when neurology appointment is.      , MD 

## 2018-10-21 NOTE — ED Triage Notes (Signed)
Per [facility] pt was eating out with family when she began coming of nausea then had a min episode of unresponsive. Patient alert and orientated x4. Only c/o right shoulder pain from a few days ago.

## 2018-10-21 NOTE — Discharge Instructions (Signed)
You either have a fainting episode or a small seizure.   Consider following up with your primary care doctor and cardiologist   Consider following up with a neurologist as listed here   Return to ER if you have another seizure, passing out, chest pain, trouble breathing

## 2018-10-21 NOTE — ED Provider Notes (Signed)
[facility] EMERGENCY DEPARTMENT Provider Note   CSN: [id number] Arrival date & time: 10/21/18  1903     History   Chief Complaint Chief Complaint  Patient presents with  . Loss of Consciousness    HPI Hannah [name] is a 78 y.o. female history of PE on Coumadin, CHF, vomiting hypertension here presenting with altered mental status.  Patient was at the dinner table was eating with family at a restaurant.  She felt nauseated suddenly and asked for the salt.  Husband passed her the salt and noticed that her eyes rolled back and she became altered .  No seizure-like activity and she did not urinate on herself.  She denies any chest pain or shortness of breath .  Denies any fevers or chills.  She states that around March of this year she had similar symptoms but no cause were found.  Denies any history of seizures.     The history is provided by the patient and a relative.    Past Medical History:  Diagnosis Date  . Acute massive pulmonary embolism ([place]) 07/13/2012   Massive PE w/ PEA arrest 07/13/12 >TNK >IVC filter >discharged on comadin    . Diabetes mellitus, type 2 ([place]) 07/15/2012   with peripheral neuropathy  . Hyperlipemia 07/14/2012  . Hypertension 07/14/2012  . Large bowel stricture (HCC)    s/p colectomy in 2016  . Osteoarthritis    s/p hip and knee replacements  . Thyroid disease     Patient Active Problem List   Diagnosis Date Noted  . Frequent falls 05/21/2018  . Encounter for therapeutic drug monitoring 05/07/2018  . Large bowel stricture (HCC) 12/15/2017  . Chronic diastolic CHF (congestive heart failure) ([place]) 03/15/2017  . Anticoagulated on warfarin   . Pulmonary hypertension ([place]) 07/26/2016  . Neuropathy 02/12/2015  . Osteoarthritis 08/12/2014  . History of pulmonary embolism 06/10/2013  . Hypothyroid 06/06/2013  . Essential hypertension 06/06/2013  . Diabetes mellitus with neurological manifestations, controlled ([place]) 06/06/2013  . DM2  (diabetes mellitus, type 2) ([place]) 07/23/2012    Past Surgical History:  Procedure Laterality Date  . COLONOSCOPY N/A 03/12/2016   Procedure: COLONOSCOPY;  Surgeon: Teena Irani, MD;  Location[facility]COPY;  Service: Endoscopy;  Laterality: N/A;  . COLOSTOMY N/A 06/03/2014   Procedure: COLOSTOMY;  Surgeon: Erroll Luna, MD;  Location: [facility];  Service: General;  Laterality: N/A;  . FLEXIBLE SIGMOIDOSCOPY N/A 05/30/2014   Procedure: Beryle Quant;  Surgeon: Carol Ada, MD;  Location: [facility] ENDOSCOPY;  Service: Endoscopy;  Laterality: N/A;  . INSERTION OF VENA CAVA FILTER N/A 07/16/2012   Procedure: INSERTION OF VENA CAVA FILTER;  Surgeon: Elam Dutch, MD;  Location: [facility] CATH LAB;  Service: Cardiovascular;  Laterality: N/A;  . KNEE ARTHROSCOPY    . PARTIAL COLECTOMY N/A 06/03/2014   Procedure: PARTIAL COLECTOMY;  Surgeon: Erroll Luna, MD;  Location: [facility];  Service: General;  Laterality: N/A;  . REDUCTION MAMMAPLASTY Bilateral   . SP ARTHRO HIP*L*       OB History   No obstetric history on file.      Home Medications    Prior to Admission medications   Medication Sig Start Date End Date Taking? Authorizing Provider  acetaminophen (TYLENOL) 500 MG tablet Take 500 mg by mouth every 6 (six) hours as needed for mild pain.     [provider]  amLODipine (NORVASC) 2.5 MG tablet Take 1 tablet (2.5 mg total) by mouth at bedtime. 09/21/18  Koberlein, Steele Berg, MD  Blood Glucose Monitoring Suppl (ONETOUCH VERIO) w/Device KIT 1 each by Does not apply route as directed. 06/07/18   [name], MD  furosemide (LASIX) 20 MG tablet Take 1 tablet (20 mg total) by mouth daily as needed for fluid or edema. 06/28/18   Lucretia Kern, DO  glucose blood [facility] VERIO) test strip Use as instructed once a day 09/19/17   Colin Benton R, DO  glucose blood test strip Use as instructed once a day 05/26/17   Lucretia Kern, DO  levothyroxine (SYNTHROID) 25 MCG tablet TAKE 1 TABLET BY MOUTH  ONCE DAILY BEFORE BREAKFAST 05/23/18   Koberlein, Junell C, MD  lisinopril (ZESTRIL) 40 MG tablet Take 1 tablet (40 mg total) by mouth daily. 09/12/18   Koberlein, Steele Berg, MD  metFORMIN (GLUCOPHAGE) 1000 MG tablet TAKE ONE TABLET BY MOUTH WITH BREAKFAST AND TAKE ONE TABLET WITH DINNER 09/12/18   Koberlein, Steele Berg, MD  metoprolol tartrate (LOPRESSOR) 25 MG tablet Take 1 tablet (25 mg total) by mouth 2 (two) times daily. 09/12/18   [name], MD  OneTouch Delica Lancets 32I MISC USE 1  TO CHECK GLUCOSE ONCE DAILY 07/02/18   Koberlein, Andris Flurry C, MD  polyethylene glycol (MIRALAX / GLYCOLAX) packet Take 17 g by mouth daily. Patient taking differently: Take 17 g by mouth every other day.  03/19/17   Elodia Florence., MD  simvastatin (ZOCOR) 20 MG tablet Take 1 tablet (20 mg total) by mouth at bedtime. 09/12/18   [name], MD  vitamin C (ASCORBIC ACID) 500 MG tablet Take 500 mg by mouth daily.     [provider]  warfarin (COUMADIN) 5 MG tablet Take 1/2 or 1 tablet daily as directed by Coumadin clinic 07/16/18   Fay Records, MD    Family History Family History  Problem Relation Age of Onset  . Breast cancer Mother     Social History Social History   Tobacco Use  . Smoking status: Former Smoker    Packs/day: 1.00    Years: 10.00    Pack years: 10.00    Types: Cigarettes    Quit date: 01/04/1968    Years since quitting: 50.8  . Smokeless tobacco: Never Used  Substance Use Topics  . Alcohol use: No  . Drug use: No     Allergies   Augmentin [amoxicillin-pot clavulanate], Tranxene [clorazepate], and Penicillins   Review of Systems Review of Systems  Cardiovascular: Positive for syncope.  Neurological: Positive for syncope.  All other systems reviewed and are negative.    Physical Exam Updated Vital Signs BP 140/75 (BP Location: Left Arm)   Pulse 88   Temp 98.7 F (37.1 C) (Oral)   Resp 16   Ht '5\' 7"'  (1.702 m)   Wt 75.8 kg   SpO2 99%   BMI  26.16 kg/m   Physical Exam Vitals signs and nursing note reviewed.  HENT:     Head: Normocephalic.     Right Ear: Tympanic membrane normal.     Nose: Nose normal.     Mouth/Throat:     Mouth: Mucous membranes are moist.  Eyes:     Extraocular Movements: Extraocular movements intact.     Pupils: Pupils are equal, round, and reactive to light.  Neck:     Musculoskeletal: Normal range of motion.  Cardiovascular:     Rate and Rhythm: Normal rate and regular rhythm.     Pulses: Normal pulses.  Heart sounds: Normal heart sounds.  Pulmonary:     Effort: Pulmonary effort is normal.     Breath sounds: Normal breath sounds.  Abdominal:     General: Abdomen is flat.     Palpations: Abdomen is soft.  Musculoskeletal: Normal range of motion.     Comments: Mild R proximal humerus tenderness but no obvious deformity. Able to range R shoulder   Skin:    General: Skin is warm.     Capillary Refill: Capillary refill takes less than 2 seconds.  Neurological:     General: No focal deficit present.     Mental Status: She is alert.     Comments: CN 2- 12 intact, nl strength and sensation throughout, nl finger to nose bilaterally.   Psychiatric:        Mood and Affect: Mood normal.      ED Treatments / Results  Labs (all labs ordered are listed, but only abnormal results are displayed) Labs Reviewed  COMPREHENSIVE METABOLIC PANEL - Abnormal; Notable for the following components:      Result Value   CO2 20 (*)    Glucose, Bld 125 (*)    Creatinine, Ser 1.36 (*)    Total Protein 6.0 (*)    Albumin 3.3 (*)    GFR calc non Af Amer 37 (*)    GFR calc Af Amer 43 (*)    All other components within normal limits  PROTIME-INR - Abnormal; Notable for the following components:   Prothrombin Time 25.7 (*)    INR 2.4 (*)    All other components within normal limits  CBG MONITORING, ED - Abnormal; Notable for the following components:   Glucose-Capillary 141 (*)    All other components  within normal limits  CBC WITH DIFFERENTIAL/PLATELET  TROPONIN I (HIGH SENSITIVITY)  TROPONIN I (HIGH SENSITIVITY)    EKG EKG Interpretation  Date/Time:  Sunday October 21 2018 19:09:13 EDT Ventricular Rate:  61 PR Interval:    QRS Duration: 135 QT Interval:  399 QTC Calculation: 402 R Axis:   -66 Text Interpretation:  Sinus rhythm Ventricular premature complex LVH with IVCD, LAD and secondary repol abnrm No significant change since last tracing Confirmed by Wandra Arthurs [phone]) on 10/21/2018 7:24:01 PM   Radiology No results found.  Procedures Procedures (including critical care time)  Medications Ordered in ED Medications  sodium chloride 0.9 % bolus 1,000 mL (0 mLs Intravenous Stopped 10/21/18 2144)  ondansetron (ZOFRAN) injection 4 mg (4 mg Intravenous Given 10/21/18 1936)     Initial Impression / Assessment and Plan / ED Course  I have reviewed the triage vital signs and the nursing notes.  Pertinent labs & imaging results that were available during my care of the patient were reviewed by me and considered in my medical decision making (see chart for details).       Hannah [name] is a 78 y.o. female here with AMS. Likely seizure vs syncope vs electrolyte abnormality. Will get labs, CT head. Will get orthostatics. Back to baseline now.   10:59 PM Labs unremarkable. Trop neg x 2. INR 2.4. CT head showed no bleed. R shoulder xray showed arthritis. Will dc home. Has cardiology follow up. Consider seizure vs syncope. Will refer to neurology as well    Final Clinical Impressions(s) / ED Diagnoses   Final diagnoses:  None    ED Discharge Orders    None       Drenda Freeze, MD 10/21/18 2302

## 2018-10-21 NOTE — ED Notes (Signed)
Patient transported to CT and X ray 

## 2018-10-22 ENCOUNTER — Encounter: Payer: Self-pay | Admitting: Family Medicine

## 2018-10-22 ENCOUNTER — Ambulatory Visit (INDEPENDENT_AMBULATORY_CARE_PROVIDER_SITE_OTHER): Payer: Medicare Other | Admitting: Family Medicine

## 2018-10-22 VITALS — BP 160/90 | HR 69 | Temp 97.3°F | Ht 67.0 in | Wt 154.7 lb

## 2018-10-22 DIAGNOSIS — I5032 Chronic diastolic (congestive) heart failure: Secondary | ICD-10-CM | POA: Diagnosis not present

## 2018-10-22 DIAGNOSIS — E538 Deficiency of other specified B group vitamins: Secondary | ICD-10-CM | POA: Diagnosis not present

## 2018-10-22 DIAGNOSIS — I10 Essential (primary) hypertension: Secondary | ICD-10-CM | POA: Diagnosis not present

## 2018-10-22 DIAGNOSIS — R531 Weakness: Secondary | ICD-10-CM

## 2018-10-22 DIAGNOSIS — E1142 Type 2 diabetes mellitus with diabetic polyneuropathy: Secondary | ICD-10-CM

## 2018-10-22 NOTE — Patient Instructions (Addendum)
Try breaking that amlodipine 2.5mg  in half at bedtime. See how you do with this.   Work on adding in protein/calories to your day. Consider Glucerna or similar supplement. Protein bars. Consider greek yogurt smoothies. Consider fairlife milk.   Update me in 3-4 weeks about how eating is going and how blood pressures are doing.   Let me know when neurology appointment is.

## 2018-10-24 ENCOUNTER — Ambulatory Visit: Payer: Medicare Other | Admitting: Family Medicine

## 2018-10-25 ENCOUNTER — Telehealth: Payer: Self-pay | Admitting: Neurology

## 2018-10-25 ENCOUNTER — Telehealth: Payer: Self-pay | Admitting: *Deleted

## 2018-10-25 ENCOUNTER — Ambulatory Visit: Payer: Medicare Other | Admitting: Neurology

## 2018-10-25 ENCOUNTER — Other Ambulatory Visit: Payer: Self-pay

## 2018-10-25 ENCOUNTER — Encounter: Payer: Self-pay | Admitting: Neurology

## 2018-10-25 VITALS — BP 152/82 | HR 70 | Temp 97.6°F

## 2018-10-25 DIAGNOSIS — R55 Syncope and collapse: Secondary | ICD-10-CM

## 2018-10-25 DIAGNOSIS — R4189 Other symptoms and signs involving cognitive functions and awareness: Secondary | ICD-10-CM

## 2018-10-25 NOTE — Telephone Encounter (Signed)
-----   Message from [name], MD sent at 10/24/2018  7:35 AM EDT ----- I forgot to talk with daughter when they were here to make sure that Tonie is taking oral B12 since levels were low when we did bloodwork and this could contribute to weakness. Please make sure they are doing the sublingual dissolvable B12 at least 1084mcg daily (preferably 2048mcg)

## 2018-10-25 NOTE — Telephone Encounter (Signed)
I called the pts daughter, Sharyn Lull and informed her of the message below.

## 2018-10-25 NOTE — Telephone Encounter (Signed)
[employer] medicare order sent to GI. No auth they will reach out to the patient to schedule.  

## 2018-10-25 NOTE — Progress Notes (Signed)
Subjective:    Patient ID: Hannah [name] is a 78 y.o. female.  HPI     Star Age, MD, PhD [facility] [address], [address] [place], [place] [address]  I saw [name] as a referral from the emergency room for recent syncope.  The patient is accompanied by her daughter today. She is a 88 year old right-handed woman with an underlying complex medical history of PE, on Coumadin therapy, history of pulmonary hypertension, neuropathy, diabetes, hypothyroidism, hypertension, osteoarthritis with joint surgeries, history of large bowel stricture and status post partial colectomy, chronic diastolic CHF, history of cardiac arrest in 2014, status post IVC filter placement, who presented to the emergency room on 10/21/2018 after a presyncopal or brief syncopal event that happened while she was eating dinner with the family.  They were at a restaurant.  Her family noticed that she became unresponsive after she asked for salt.  She reports that she felt nauseated and felt like salt would help and she asked for the saltshaker, but then her eyes apparently rolled back and she became unresponsive, no twitching or convulsion, no tongue bite, no bowel or bladder incontinence.  She had a similar spell and a fall in May 2020.  She recently saw her primary care physician in follow-up for blood pressure management as her BP has been elevated.  She had blood work through her primary care physician.  On 09/12/2018, her B12 level was low at 181.  Her A1c was 6.1. I also reviewed the emergency room records from 10/21/2018, troponin was negative, CT head without contrast on 10/21/2018 showed: IMPRESSION: 1. No evidence of acute intracranial abnormality. 2. Atrophy and chronic small-vessel white matter ischemic changes.   Blood sugar level was 125 in the emergency room, BUN 18, creatinine elevated at 1.36. The patient is widowed, she lives alone, she has 2 daughters that live in  [place], about a mile away from where she lives, her son lives in [place]. Her daughter reports that she does not hydrate very well.  Patient reports that she drinks 2-3 bottles of water per day.  She has a cane, she also uses a 4 wheeled walker with a seat.  Her Past Medical History Is Significant For: Past Medical History:  Diagnosis Date  . Acute massive pulmonary embolism ([place]) 07/13/2012   Massive PE w/ PEA arrest 07/13/12 >TNK >IVC filter >discharged on comadin    . Diabetes mellitus, type 2 ([place]) 07/15/2012   with peripheral neuropathy  . Hyperlipemia 07/14/2012  . Hypertension 07/14/2012  . Large bowel stricture (HCC)    s/p colectomy in 2016  . Osteoarthritis    s/p hip and knee replacements  . Thyroid disease     Her Past Surgical History Is Significant For: Past Surgical History:  Procedure Laterality Date  . COLONOSCOPY N/A 03/12/2016   Procedure: COLONOSCOPY;  Surgeon: Teena Irani, MD;  Location: [facility] ENDOSCOPY;  Service: Endoscopy;  Laterality: N/A;  . COLOSTOMY N/A 06/03/2014   Procedure: COLOSTOMY;  Surgeon: Erroll Luna, MD;  Location: [facility];  Service: General;  Laterality: N/A;  . FLEXIBLE SIGMOIDOSCOPY N/A 05/30/2014   Procedure: Beryle Quant;  Surgeon: Carol Ada, MD;  Location: [facility] ENDOSCOPY;  Service: Endoscopy;  Laterality: N/A;  . INSERTION OF VENA CAVA FILTER N/A 07/16/2012   Procedure: INSERTION OF VENA CAVA FILTER;  Surgeon: Elam Dutch, MD;  Location: [facility] CATH LAB;  Service: Cardiovascular;  Laterality: N/A;  . KNEE ARTHROSCOPY    . PARTIAL  COLECTOMY N/A 06/03/2014   Procedure: PARTIAL COLECTOMY;  Surgeon: Erroll Luna, MD;  Location: [facility];  Service: General;  Laterality: N/A;  . REDUCTION MAMMAPLASTY Bilateral   . SP ARTHRO HIP*L*      Her Family History Is Significant For: Family History  Problem Relation Age of Onset  . Breast cancer Mother     Her Social History Is Significant For: Social History   Socioeconomic History  .  Marital status: Widowed    Spouse name: Not on file  . Number of children: Not on file  . Years of education: Not on file  . Highest education level: Not on file  Occupational History  . Not on file  Social Needs  . Financial resource strain: Not on file  . Food insecurity    Worry: Not on file    Inability: Not on file  . Transportation needs    Medical: Not on file    Non-medical: Not on file  Tobacco Use  . Smoking status: Former Smoker    Packs/day: 1.00    Years: 10.00    Pack years: 10.00    Types: Cigarettes    Quit date: 01/04/1968    Years since quitting: 50.8  . Smokeless tobacco: Never Used  Substance and Sexual Activity  . Alcohol use: No  . Drug use: No  . Sexual activity: Not on file  Lifestyle  . Physical activity    Days per week: Not on file    Minutes per session: Not on file  . Stress: Not on file  Relationships  . Social Herbalist on phone: Not on file    Gets together: Not on file    Attends religious service: Not on file    Active member of club or organization: Not on file    Attends meetings of clubs or organizations: Not on file    Relationship status: Not on file  Other Topics Concern  . Not on file  Social History Narrative   Lives alone.           Her Allergies Are:  Allergies  Allergen Reactions  . Augmentin [Amoxicillin-Pot Clavulanate] Itching and Other (See Comments)    Severe vaginal itching  . Tranxene [Clorazepate] Itching  . Penicillins Itching and Rash    Has patient had a PCN reaction causing immediate rash, facial/tongue/throat swelling, SOB or lightheadedness with hypotension: Yes Has patient had a PCN reaction causing severe rash involving mucus membranes or skin necrosis: No Has patient had a PCN reaction that required hospitalization: No Has patient had a PCN reaction occurring within the last 10 years: No If all of the above answers are "NO", then may proceed with Cephalosporin use.   :   Her Current  Medications Are:  Outpatient Encounter Medications as of 10/25/2018  Medication Sig  . acetaminophen (TYLENOL) 500 MG tablet Take 500 mg by mouth every 6 (six) hours as needed for mild pain.   Marland Kitchen amLODipine (NORVASC) 2.5 MG tablet Take 1 tablet (2.5 mg total) by mouth at bedtime.  . Blood Glucose Monitoring Suppl (ONETOUCH VERIO) w/Device KIT 1 each by Does not apply route as directed.  . Cyanocobalamin (VITAMIN B-12 PO) Take 1 tablet by mouth daily.  . furosemide (LASIX) 20 MG tablet Take 1 tablet (20 mg total) by mouth daily as needed for fluid or edema.  Marland Kitchen glucose blood (ONETOUCH VERIO) test strip Use as instructed once a day  . glucose blood test strip Use  as instructed once a day  . levothyroxine (SYNTHROID) 25 MCG tablet TAKE 1 TABLET BY MOUTH ONCE DAILY BEFORE BREAKFAST  . lisinopril (ZESTRIL) 40 MG tablet Take 1 tablet (40 mg total) by mouth daily.  . metFORMIN (GLUCOPHAGE) 1000 MG tablet TAKE ONE TABLET BY MOUTH WITH BREAKFAST AND TAKE ONE TABLET WITH DINNER  . metoprolol tartrate (LOPRESSOR) 25 MG tablet Take 1 tablet (25 mg total) by mouth 2 (two) times daily.  [name] Delica Lancets 54Y MISC USE 1  TO CHECK GLUCOSE ONCE DAILY  . polyethylene glycol (MIRALAX / GLYCOLAX) packet Take 17 g by mouth daily. (Patient taking differently: Take 17 g by mouth every other day. )  . simvastatin (ZOCOR) 20 MG tablet Take 1 tablet (20 mg total) by mouth at bedtime.  . vitamin C (ASCORBIC ACID) 500 MG tablet Take 500 mg by mouth daily.   Marland Kitchen warfarin (COUMADIN) 5 MG tablet Take 1/2 or 1 tablet daily as directed by Coumadin clinic   No facility-administered encounter medications on file as of 10/25/2018.   :   Review of Systems:  Out of a complete 14 point review of systems, all are reviewed and negative with the exception of these symptoms as listed below:  Review of Systems  Neurological:       Pt presents today to discuss her spells of AMS. Pt was eating dinner, started to feel nauseated,  and then her eyes rolled back and she was unresponsive. She was taken to the ER. The most recent spell is her second one.    Objective:  Neurological Exam  Physical Exam Physical Examination:   Vitals:   10/25/18 1134  BP: (!) 152/82  Pulse: 70  Temp: 97.6 F (36.4 C)   General Examination: The patient is a very pleasant 78 y.o. female in no acute distress. She appears frail and deconditioned, she is currently Situated in a clinic wheelchair as she was walking unsteadily with her single-point cane. She denies nausea, she denies vertiginous symptoms or lightheadedness.  HEENT: Normocephalic, atraumatic, pupils are equal, round and reactive to light and accommodation. Extraocular tracking is mildly impaired in all directions, no nystagmus, hearing is grossly intact.  Face is symmetric with the exception of slight Effacement of the right nasolabial fold.  Speech is not dysarthric.  No significant facial masking, no significant nuchal rigidity.  She does have a hard time relaxing.  Speech is somewhat slow and low volume.  Airway examination reveals moderate mouth dryness, she has partial dentures in place.  She has no deviation of the tongue.  No abnormal involuntary tongue or mouth movements.  Chest: Clear to auscultation without wheezing, rhonchi or crackles noted.  Heart: S1+S2+0, regular and normal without murmurs, rubs or gallops noted.   Abdomen: Soft, non-tender and non-distended with normal bowel sounds appreciated on auscultation.  Extremities: There is 1+ pitting edema in the distal lower extremities bilaterally, Mostly around the ankles, right more than left.  Skin: Warm and dry without trophic changes noted.  Musculoskeletal: exam reveals Mildly decreased range of motion in the left hip, better on the right side, decreased range of motion in the left and right knee, no pain reported.   Neurologically:  Mental status: The patient is awake, alert and oriented in all 4 spheres.  Her immediate and remote memory, attention, language skills and fund of knowledge are Mildly impaired.  She is somewhat slow in thinking and responding.  Her mood is constricted, she appears to have a flat affect.  Cranial nerves II - XII are as described above under HEENT exam. In addition: shoulder shrug is normal with equal shoulder height noted. Motor exam: Normal bulk, strength and tone is noted. There is no drift, But she has an intermittent trembling in the right hand which is variable, it is distractible as well.  No telltale resting tremor, no postural or action tremor. Romberg is Not tested as she cannot stand narrow based.  Reflexes are 1+, absent in the ankles. Fine motor skills and coordination: Globally mildly impaired, slightly slow, somewhat deliberate movements when testing finger-to-nose, no obvious cerebellar signs such as dysmetria or intention tremor.  Sensory exam: intact to light touch.  Gait, station and balance: She Stands with difficulty and has to push herself up.  She requires no assistance.  She does use a single-point cane in her right hand.  She walks with a limp on the right side, this is not new.  She has no shuffling.  She has no freezing.  She walks slowly and cautiously, turns slowly.  Assessment and Plan:  Assessment and Plan:  In summary, Kaina Orengo is a very pleasant 78 y.o.-year old female with an underlying complex medical history of PE, on Coumadin therapy, history of pulmonary hypertension, neuropathy, diabetes, hypothyroidism, hypertension, osteoarthritis with joint surgeries, history of large bowel stricture and status post partial colectomy, chronic diastolic CHF, history of cardiac arrest in 2014, status post IVC filter placement, who Presents for evaluation of a spell of unresponsiveness.  Differential diagnosis primarily includes syncope and collapse, seizure less likely in the absence of any other motor symptoms.  Nevertheless, neurological work-up is  reasonable.  I would like to proceed with a brain MRI, unfortunately her creatinine level is not good enough for safely doing MRI with contrast, I would like to pursue a brain MRI without contrast only for now.  I Also suggest we proceed with an EEG.  The patient and her daughter are in agreement.  I explained these 2 tests to them.  She may not be hydrating well enough and is encouraged to stay better hydrated with water.  She has no obvious focal neurological signs today, subtle effacement of the right nasolabial fold is noted but unclear if this is stable, her daughter reports that she is legally blind and cannot say for sure.  The patient lives alone.  She is advised to stay well-hydrated, change positions slowly, use her walker at all times and follow-up routinely with her primary care physician.  We will be in touch regarding her test results from my end of things.I will follow-up routinely in 3 months, sooner if needed.  She has an appointment with her cardiologist pending for next year, she is regularly seen in Coumadin clinic for checkup of her INR, her INR was in range recently in the emergency room. On examination, she also has some nonspecific gait unsteadiness today, this is likely secondary to a combination of factors including multiple joint replacements. She may benefit from seeing her cardiologist sooner, Since a cardiac cause for her symptoms is not excluded such as bradycardia.  I discussed this with the patient and her daughter today.  For now, we will proceed with testing through our office and take it from there.I answered all their questions today and the patient and her daughter were in agreement.  Star Age, MD, PhD

## 2018-10-25 NOTE — Patient Instructions (Signed)
We will investigate your spells of unresponsiveness from the neurological standpoint: We will do an EEG (brainwave test), which we will schedule. We will call you with the results. We will do a brain scan, called MRI and call you with the test results. We will have to schedule you for this on a separate date. This test requires authorization from your insurance, and we will take care of the insurance process. Your kidney function recently was not good enough to justify giving you contrast so we will do a brain MRI without contrast only. Please stay well-hydrated with water, 6 to 8 cups are recommended. Change positions slowly, use your walker at all times.

## 2018-10-30 ENCOUNTER — Other Ambulatory Visit: Payer: Self-pay

## 2018-10-30 ENCOUNTER — Emergency Department (HOSPITAL_COMMUNITY): Payer: Medicare Other

## 2018-10-30 ENCOUNTER — Inpatient Hospital Stay (HOSPITAL_COMMUNITY)
Admission: EM | Admit: 2018-10-30 | Discharge: 2018-11-02 | DRG: 641 | Disposition: A | Discharge status: Skilled Nursing Facility | Payer: Medicare Other | Attending: Family Medicine | Admitting: Family Medicine

## 2018-10-30 ENCOUNTER — Encounter (HOSPITAL_COMMUNITY): Payer: Self-pay

## 2018-10-30 DIAGNOSIS — R5383 Other fatigue: Secondary | ICD-10-CM

## 2018-10-30 DIAGNOSIS — N183 Chronic kidney disease, stage 3 unspecified: Secondary | ICD-10-CM | POA: Diagnosis not present

## 2018-10-30 DIAGNOSIS — I10 Essential (primary) hypertension: Secondary | ICD-10-CM | POA: Diagnosis present

## 2018-10-30 DIAGNOSIS — I951 Orthostatic hypotension: Secondary | ICD-10-CM | POA: Diagnosis present

## 2018-10-30 DIAGNOSIS — I447 Left bundle-branch block, unspecified: Secondary | ICD-10-CM | POA: Diagnosis not present

## 2018-10-30 DIAGNOSIS — R296 Repeated falls: Secondary | ICD-10-CM | POA: Diagnosis not present

## 2018-10-30 DIAGNOSIS — Z79899 Other long term (current) drug therapy: Secondary | ICD-10-CM | POA: Diagnosis not present

## 2018-10-30 DIAGNOSIS — Z888 Allergy status to other drugs, medicaments and biological substances status: Secondary | ICD-10-CM

## 2018-10-30 DIAGNOSIS — K56699 Other intestinal obstruction unspecified as to partial versus complete obstruction: Secondary | ICD-10-CM | POA: Diagnosis present

## 2018-10-30 DIAGNOSIS — R55 Syncope and collapse: Secondary | ICD-10-CM | POA: Diagnosis not present

## 2018-10-30 DIAGNOSIS — Z7984 Long term (current) use of oral hypoglycemic drugs: Secondary | ICD-10-CM

## 2018-10-30 DIAGNOSIS — E785 Hyperlipidemia, unspecified: Secondary | ICD-10-CM | POA: Diagnosis present

## 2018-10-30 DIAGNOSIS — Z8674 Personal history of sudden cardiac arrest: Secondary | ICD-10-CM | POA: Diagnosis not present

## 2018-10-30 DIAGNOSIS — I361 Nonrheumatic tricuspid (valve) insufficiency: Secondary | ICD-10-CM | POA: Diagnosis not present

## 2018-10-30 DIAGNOSIS — E1122 Type 2 diabetes mellitus with diabetic chronic kidney disease: Secondary | ICD-10-CM | POA: Diagnosis not present

## 2018-10-30 DIAGNOSIS — Z20828 Contact with and (suspected) exposure to other viral communicable diseases: Secondary | ICD-10-CM | POA: Diagnosis present

## 2018-10-30 DIAGNOSIS — I959 Hypotension, unspecified: Secondary | ICD-10-CM | POA: Diagnosis not present

## 2018-10-30 DIAGNOSIS — Z87891 Personal history of nicotine dependence: Secondary | ICD-10-CM

## 2018-10-30 DIAGNOSIS — E1142 Type 2 diabetes mellitus with diabetic polyneuropathy: Secondary | ICD-10-CM | POA: Diagnosis not present

## 2018-10-30 DIAGNOSIS — E039 Hypothyroidism, unspecified: Secondary | ICD-10-CM | POA: Diagnosis not present

## 2018-10-30 DIAGNOSIS — Z880 Allergy status to penicillin: Secondary | ICD-10-CM

## 2018-10-30 DIAGNOSIS — I272 Pulmonary hypertension, unspecified: Secondary | ICD-10-CM | POA: Diagnosis not present

## 2018-10-30 DIAGNOSIS — I130 Hypertensive heart and chronic kidney disease with heart failure and stage 1 through stage 4 chronic kidney disease, or unspecified chronic kidney disease: Secondary | ICD-10-CM | POA: Diagnosis present

## 2018-10-30 DIAGNOSIS — E119 Type 2 diabetes mellitus without complications: Secondary | ICD-10-CM

## 2018-10-30 DIAGNOSIS — E860 Dehydration: Secondary | ICD-10-CM | POA: Diagnosis not present

## 2018-10-30 DIAGNOSIS — I160 Hypertensive urgency: Secondary | ICD-10-CM | POA: Diagnosis present

## 2018-10-30 DIAGNOSIS — Z9049 Acquired absence of other specified parts of digestive tract: Secondary | ICD-10-CM | POA: Diagnosis not present

## 2018-10-30 DIAGNOSIS — Z79890 Hormone replacement therapy (postmenopausal): Secondary | ICD-10-CM

## 2018-10-30 DIAGNOSIS — Z86711 Personal history of pulmonary embolism: Secondary | ICD-10-CM

## 2018-10-30 DIAGNOSIS — I5032 Chronic diastolic (congestive) heart failure: Secondary | ICD-10-CM | POA: Diagnosis not present

## 2018-10-30 DIAGNOSIS — Z881 Allergy status to other antibiotic agents status: Secondary | ICD-10-CM

## 2018-10-30 DIAGNOSIS — I340 Nonrheumatic mitral (valve) insufficiency: Secondary | ICD-10-CM | POA: Diagnosis not present

## 2018-10-30 DIAGNOSIS — Z7901 Long term (current) use of anticoagulants: Secondary | ICD-10-CM

## 2018-10-30 DIAGNOSIS — I5020 Unspecified systolic (congestive) heart failure: Secondary | ICD-10-CM | POA: Diagnosis present

## 2018-10-30 LAB — PROTIME-INR
INR: 2 — ABNORMAL HIGH (ref 0.8–1.2)
Prothrombin Time: 22.5 seconds — ABNORMAL HIGH (ref 11.4–15.2)

## 2018-10-30 LAB — MAGNESIUM: Magnesium: 1.6 mg/dL — ABNORMAL LOW (ref 1.7–2.4)

## 2018-10-30 LAB — BASIC METABOLIC PANEL
Anion gap: 6 (ref 5–15)
BUN: 13 mg/dL (ref 8–23)
CO2: 25 mmol/L (ref 22–32)
Calcium: 8.8 mg/dL — ABNORMAL LOW (ref 8.9–10.3)
Chloride: 105 mmol/L (ref 98–111)
Creatinine, Ser: 1.19 mg/dL — ABNORMAL HIGH (ref 0.44–1.00)
GFR calc Af Amer: 51 mL/min — ABNORMAL LOW (ref >60)
GFR calc non Af Amer: 44 mL/min — ABNORMAL LOW (ref >60)
Glucose, Bld: 263 mg/dL — ABNORMAL HIGH (ref 70–99)
Potassium: 3.7 mmol/L (ref 3.5–5.1)
Sodium: 136 mmol/L (ref 135–145)

## 2018-10-30 LAB — CBG MONITORING, ED: Glucose-Capillary: 240 mg/dL — ABNORMAL HIGH (ref 70–99)

## 2018-10-30 LAB — CBC
HCT: 40.3 % (ref 36.0–46.0)
Hemoglobin: 12.1 g/dL (ref 12.0–15.0)
MCH: 28.1 pg (ref 26.0–34.0)
MCHC: 30 g/dL (ref 30.0–36.0)
MCV: 93.5 fL (ref 80.0–100.0)
Platelets: 240 10*3/uL (ref 150–400)
RBC: 4.31 MIL/uL (ref 3.87–5.11)
RDW: 15 % (ref 11.5–15.5)
WBC: 7.5 10*3/uL (ref 4.0–10.5)
nRBC: 0 % (ref 0.0–0.2)

## 2018-10-30 LAB — TROPONIN I (HIGH SENSITIVITY)
Troponin I (High Sensitivity): 6 ng/L (ref <18)
Troponin I (High Sensitivity): 8 ng/L (ref <18)

## 2018-10-30 LAB — BRAIN NATRIURETIC PEPTIDE: B Natriuretic Peptide: 156.4 pg/mL — ABNORMAL HIGH (ref 0.0–100.0)

## 2018-10-30 LAB — TSH: TSH: 4.408 u[IU]/mL (ref 0.350–4.500)

## 2018-10-30 MED ORDER — WARFARIN SODIUM 5 MG PO TABS
5.0000 mg | ORAL_TABLET | ORAL | Status: DC
  Administered 2018-10-31: 5 mg via ORAL
  Filled 2018-10-30: qty 1

## 2018-10-30 MED ORDER — WARFARIN - PHARMACIST DOSING INPATIENT
Freq: Every day | Status: DC
  Administered 2018-10-31: 17:00:00

## 2018-10-30 MED ORDER — SODIUM CHLORIDE 0.9% FLUSH
3.0000 mL | Freq: Once | INTRAVENOUS | Status: AC
  Administered 2018-10-30: 3 mL via INTRAVENOUS

## 2018-10-30 MED ORDER — WARFARIN SODIUM 2.5 MG PO TABS
2.5000 mg | ORAL_TABLET | ORAL | Status: DC
  Filled 2018-10-30: qty 1

## 2018-10-30 NOTE — Progress Notes (Signed)
ANTICOAGULATION CONSULT NOTE - Initial Consult  Pharmacy Consult for Coumadin Indication: h/o PE  Allergies  Allergen Reactions  . Augmentin [Amoxicillin-Pot Clavulanate] Itching and Other (See Comments)    Severe vaginal itching  . Tranxene [Clorazepate] Itching  . Penicillins Itching and Rash    Has patient had a PCN reaction causing immediate rash, facial/tongue/throat swelling, SOB or lightheadedness with hypotension: Yes Has patient had a PCN reaction causing severe rash involving mucus membranes or skin necrosis: No Has patient had a PCN reaction that required hospitalization: No Has patient had a PCN reaction occurring within the last 10 years: No If all of the above answers are "NO", then may proceed with Cephalosporin use.     Patient Measurements: Height: _0  (170.2 cm) Weight: 164 lb (74.4 kg) IBW/kg (Calculated) : 61.6  Vital Signs: Temp: 98.3 F (36.8 C) (10/27 1824) Temp Source: Oral (10/27 1824) BP: 179/79 (10/27 1824) Pulse Rate: 71 (10/27 1824)  Labs: Recent Labs    10/30/18 1824 10/30/18 1849 10/30/18 2144  HGB 12.1  --   --   HCT 40.3  --   --   PLT 240  --   --   LABPROT  --  22.5*  --   INR  --  2.0*  --   CREATININE 1.19*  --   --   TROPONINIHS  --  6 8    Estimated Creatinine Clearance: 41.7 mL/min (A) (by C-G formula based on SCr of 1.19 mg/dL (H)).   Medical History: Past Medical History:  Diagnosis Date  . Acute massive pulmonary embolism ([place]) 07/13/2012   Massive PE w/ PEA arrest 07/13/12 >TNK >IVC filter >discharged on comadin    . Diabetes mellitus, type 2 ([place]) 07/15/2012   with peripheral neuropathy  . Hyperlipemia 07/14/2012  . Hypertension 07/14/2012  . Large bowel stricture ([facility])    s/p colectomy in 2016  . Osteoarthritis    s/p hip and knee replacements  . Thyroid disease     Medications:  No current facility-administered medications on file prior to encounter.    Current Outpatient Medications on File Prior to  Encounter  Medication Sig Dispense Refill  . acetaminophen (TYLENOL) 500 MG tablet Take 500 mg by mouth every 6 (six) hours as needed for mild pain.     Marland Kitchen amLODipine (NORVASC) 2.5 MG tablet Take 1 tablet (2.5 mg total) by mouth at bedtime. 30 tablet 2  . Cyanocobalamin (VITAMIN B-12 PO) Take 1 tablet by mouth daily.    . furosemide (LASIX) 20 MG tablet Take 1 tablet (20 mg total) by mouth daily as needed for fluid or edema. 30 tablet 3  . levothyroxine (SYNTHROID) 25 MCG tablet TAKE 1 TABLET BY MOUTH ONCE DAILY BEFORE BREAKFAST 90 tablet 1  . lisinopril (ZESTRIL) 40 MG tablet Take 1 tablet (40 mg total) by mouth daily. 90 tablet 1  . metFORMIN (GLUCOPHAGE) 1000 MG tablet TAKE ONE TABLET BY MOUTH WITH BREAKFAST AND TAKE ONE TABLET WITH DINNER 180 tablet 1  . metoprolol tartrate (LOPRESSOR) 25 MG tablet Take 1 tablet (25 mg total) by mouth 2 (two) times daily. 180 tablet 1  . polyethylene glycol (MIRALAX / GLYCOLAX) packet Take 17 g by mouth daily. (Patient taking differently: Take 17 g by mouth daily as needed for moderate constipation. ) 14 each 0  . simvastatin (ZOCOR) 20 MG tablet Take 1 tablet (20 mg total) by mouth at bedtime. 90 tablet 1  . vitamin C (ASCORBIC ACID) 500 MG tablet Take  500 mg by mouth daily.     Marland Kitchen warfarin (COUMADIN) 5 MG tablet Take 1/2 or 1 tablet daily as directed by Coumadin clinic (Patient taking differently: Take 2.5-5 mg by mouth See admin instructions. Take 1/2 or 1 tablet daily as directed by Coumadin clinic; 5 mg (5 mg x 1) every Sun, Tue, Thu; 2.5 mg (5 mg x 0.5) all other days) 30 tablet 2  . Blood Glucose Monitoring Suppl (ONETOUCH VERIO) w/Device KIT 1 each by Does not apply route as directed. 1 kit 0  . glucose blood (ONETOUCH VERIO) test strip Use as instructed once a day 100 each 5  . glucose blood test strip Use as instructed once a day 100 each 3  . OneTouch Delica Lancets 98Q MISC USE 1  TO CHECK GLUCOSE ONCE DAILY 100 each 3    Assessment: 78 y.o. female  admitted with syncope, h/o PE, to continue Coumadin  Goal of Therapy:  INR 2-3 Monitor platelets by anticoagulation protocol: Yes   Plan:  Continue home Coumadin regimen Daily INR  Hannah [name], Hannah [name] 10/30/2018,11:25 PM

## 2018-10-30 NOTE — ED Triage Notes (Addendum)
PER EMS: pt arrives from home with c/o syncope after feeling nauseous. Pt was found minimally responsive by her daughter this evening. The patient states she just remembers feeling sick. She denies falling or hitting her head but she does take coumadin. EMS reports patients initial BP was 68 systolic with one episode of emesis upon arousal. CBG-212, 400 cc NS given en route, BP increased to 98/60. A&OX4. Denies pain or any other complaints.

## 2018-10-30 NOTE — ED Provider Notes (Signed)
[facility] EMERGENCY DEPARTMENT Provider Note   CSN: [id number] Arrival date & time: 10/30/18  1816     History   Chief Complaint Chief Complaint  Patient presents with  . Loss of Consciousness    HPI Hannah [name] is a 78 y.o. female.     The history is provided by the patient, medical records and a relative. No language interpreter was used.  Loss of Consciousness Episode history:  Multiple Most recent episode:  Today Duration:  30 seconds Timing:  Sporadic Progression:  Unchanged Chronicity:  Recurrent Witnessed: yes   Relieved by:  Nothing Worsened by:  Nothing Ineffective treatments:  None tried Associated symptoms: malaise/fatigue, nausea and vomiting   Associated symptoms: no chest pain, no confusion, no diaphoresis, no difficulty breathing, no dizziness, no fever, no focal weakness, no headaches, no recent fall, no recent injury, no seizures and no shortness of breath     Past Medical History:  Diagnosis Date  . Acute massive pulmonary embolism ([place]) 07/13/2012   Massive PE w/ PEA arrest 07/13/12 >TNK >IVC filter >discharged on comadin    . Diabetes mellitus, type 2 ([place]) 07/15/2012   with peripheral neuropathy  . Hyperlipemia 07/14/2012  . Hypertension 07/14/2012  . Large bowel stricture ([facility])    s/p colectomy in 2016  . Osteoarthritis    s/p hip and knee replacements  . Thyroid disease     Patient Active Problem List   Diagnosis Date Noted  . Frequent falls 05/21/2018  . Encounter for therapeutic drug monitoring 05/07/2018  . Large bowel stricture ([facility]) 12/15/2017  . Chronic diastolic CHF (congestive heart failure) ([place]) 03/15/2017  . Anticoagulated on warfarin   . Pulmonary hypertension ([place]) 07/26/2016  . Neuropathy 02/12/2015  . Osteoarthritis 08/12/2014  . History of pulmonary embolism 06/10/2013  . Hypothyroid 06/06/2013  . Essential hypertension 06/06/2013  . Diabetes mellitus with neurological manifestations, controlled  ([place]) 06/06/2013  . DM2 (diabetes mellitus, type 2) ([place]) 07/23/2012    Past Surgical History:  Procedure Laterality Date  . COLONOSCOPY N/A 03/12/2016   Procedure: COLONOSCOPY;  Surgeon: Teena Irani, MD;  Location: [facility] ENDOSCOPY;  Service: Endoscopy;  Laterality: N/A;  . COLOSTOMY N/A 06/03/2014   Procedure: COLOSTOMY;  Surgeon: Erroll Luna, MD;  Location: [facility];  Service: General;  Laterality: N/A;  . FLEXIBLE SIGMOIDOSCOPY N/A 05/30/2014   Procedure: Beryle Quant;  Surgeon: Carol Ada, MD;  Location[facility] ENDOSCOPY;  Service: Endoscopy;  Laterality: N/A;  . INSERTION OF VENA CAVA FILTER N/A 07/16/2012   Procedure: INSERTION OF VENA CAVA FILTER;  Surgeon: Elam Dutch, MD;  Location: [facility] CATH LAB;  Service: Cardiovascular;  Laterality: N/A;  . KNEE ARTHROSCOPY    . PARTIAL COLECTOMY N/A 06/03/2014   Procedure: PARTIAL COLECTOMY;  Surgeon: Erroll Luna, MD;  Location: [facility];  Service: General;  Laterality: N/A;  . REDUCTION MAMMAPLASTY Bilateral   . SP ARTHRO HIP*L*       OB History   No obstetric history on file.      Home Medications    Prior to Admission medications   Medication Sig Start Date End Date Taking? Authorizing Provider  acetaminophen (TYLENOL) 500 MG tablet Take 500 mg by mouth every 6 (six) hours as needed for mild pain.     [provider]  amLODipine (NORVASC) 2.5 MG tablet Take 1 tablet (2.5 mg total) by mouth at bedtime. 09/21/18   [name], MD  Blood Glucose Monitoring Suppl (ONETOUCH VERIO) w/Device KIT 1 each  by Does not apply route as directed. 06/07/18   Koberlein, Steele Berg, MD  Cyanocobalamin (VITAMIN B-12 PO) Take 1 tablet by mouth daily.    [provider]  furosemide (LASIX) 20 MG tablet Take 1 tablet (20 mg total) by mouth daily as needed for fluid or edema. 06/28/18   Lucretia Kern, DO  glucose blood [facility] VERIO) test strip Use as instructed once a day 09/19/17   Colin Benton R, DO  glucose blood test strip Use  as instructed once a day 05/26/17   Lucretia Kern, DO  levothyroxine (SYNTHROID) 25 MCG tablet TAKE 1 TABLET BY MOUTH ONCE DAILY BEFORE BREAKFAST 05/23/18   Koberlein, Junell C, MD  lisinopril (ZESTRIL) 40 MG tablet Take 1 tablet (40 mg total) by mouth daily. 09/12/18   Koberlein, Steele Berg, MD  metFORMIN (GLUCOPHAGE) 1000 MG tablet TAKE ONE TABLET BY MOUTH WITH BREAKFAST AND TAKE ONE TABLET WITH DINNER 09/12/18   Koberlein, Steele Berg, MD  metoprolol tartrate (LOPRESSOR) 25 MG tablet Take 1 tablet (25 mg total) by mouth 2 (two) times daily. 09/12/18   [name], MD  OneTouch Delica Lancets 63F MISC USE 1  TO CHECK GLUCOSE ONCE DAILY 07/02/18   Koberlein, Andris Flurry C, MD  polyethylene glycol (MIRALAX / GLYCOLAX) packet Take 17 g by mouth daily. Patient taking differently: Take 17 g by mouth every other day.  03/19/17   Elodia Florence., MD  simvastatin (ZOCOR) 20 MG tablet Take 1 tablet (20 mg total) by mouth at bedtime. 09/12/18   [name], MD  vitamin C (ASCORBIC ACID) 500 MG tablet Take 500 mg by mouth daily.     [provider]  warfarin (COUMADIN) 5 MG tablet Take 1/2 or 1 tablet daily as directed by Coumadin clinic 07/16/18   Fay Records, MD    Family History Family History  Problem Relation Age of Onset  . Breast cancer Mother     Social History Social History   Tobacco Use  . Smoking status: Former Smoker    Packs/day: 1.00    Years: 10.00    Pack years: 10.00    Types: Cigarettes    Quit date: 01/04/1968    Years since quitting: 50.8  . Smokeless tobacco: Never Used  Substance Use Topics  . Alcohol use: No  . Drug use: No     Allergies   Augmentin [amoxicillin-pot clavulanate], Tranxene [clorazepate], and Penicillins   Review of Systems Review of Systems  Constitutional: Positive for fatigue and malaise/fatigue. Negative for appetite change, chills, diaphoresis and fever.  HENT: Negative for congestion.   Eyes: Negative for visual disturbance.   Respiratory: Negative for cough, chest tightness and shortness of breath.   Cardiovascular: Positive for syncope. Negative for chest pain.  Gastrointestinal: Positive for nausea and vomiting. Negative for abdominal pain, constipation and diarrhea.  Genitourinary: Negative for frequency.  Musculoskeletal: Negative for back pain, neck pain and neck stiffness.  Neurological: Positive for syncope. Negative for dizziness, focal weakness, seizures, light-headedness and headaches.  Psychiatric/Behavioral: Negative for agitation and confusion.  All other systems reviewed and are negative.    Physical Exam Updated Vital Signs BP (!) 179/79 (BP Location: Right Arm)   Pulse 71   Temp 98.3 F (36.8 C) (Oral)   Resp 18   Ht _0  (1.702 m)   Wt 74.4 kg   SpO2 100%   BMI 25.69 kg/m   Physical Exam Vitals signs and nursing note reviewed.  Constitutional:  General: She is not in acute distress.    Appearance: She is well-developed. She is not diaphoretic.  HENT:     Head: Normocephalic and atraumatic.     Right Ear: External ear normal.     Left Ear: External ear normal.     Nose: Nose normal. No congestion or rhinorrhea.     Mouth/Throat:     Mouth: Mucous membranes are moist.     Pharynx: Oropharynx is clear. No oropharyngeal exudate or posterior oropharyngeal erythema.  Eyes:     Extraocular Movements: Extraocular movements intact.     Conjunctiva/sclera: Conjunctivae normal.     Pupils: Pupils are equal, round, and reactive to light.  Neck:     Musculoskeletal: Normal range of motion and neck supple. No muscular tenderness.  Cardiovascular:     Rate and Rhythm: Normal rate.     Pulses: Normal pulses.     Heart sounds: No murmur.  Pulmonary:     Effort: Pulmonary effort is normal. No respiratory distress.     Breath sounds: No stridor. No wheezing, rhonchi or rales.  Chest:     Chest wall: No tenderness.  Abdominal:     General: There is no distension.     Tenderness:  There is no abdominal tenderness. There is no right CVA tenderness, left CVA tenderness or rebound.  Musculoskeletal:        General: No tenderness.     Right lower leg: No edema.     Left lower leg: No edema.  Skin:    General: Skin is warm.     Coloration: Skin is not pale.     Findings: No erythema or rash.  Neurological:     General: No focal deficit present.     Mental Status: She is alert and oriented to person, place, and time.     Sensory: No sensory deficit.     Motor: No weakness or abnormal muscle tone.     Deep Tendon Reflexes: Reflexes are normal and symmetric.  Psychiatric:        Mood and Affect: Mood normal.      ED Treatments / Results  Labs (all labs ordered are listed, but only abnormal results are displayed) Labs Reviewed  BASIC METABOLIC PANEL - Abnormal; Notable for the following components:      Result Value   Glucose, Bld 263 (*)    Creatinine, Ser 1.19 (*)    Calcium 8.8 (*)    GFR calc non Af Amer 44 (*)    GFR calc Af Amer 51 (*)    All other components within normal limits  MAGNESIUM - Abnormal; Notable for the following components:   Magnesium 1.6 (*)    All other components within normal limits  BRAIN NATRIURETIC PEPTIDE - Abnormal; Notable for the following components:   B Natriuretic Peptide 156.4 (*)    All other components within normal limits  PROTIME-INR - Abnormal; Notable for the following components:   Prothrombin Time 22.5 (*)    INR 2.0 (*)    All other components within normal limits  CBG MONITORING, ED - Abnormal; Notable for the following components:   Glucose-Capillary 240 (*)    All other components within normal limits  URINE CULTURE  CBC  TSH  URINALYSIS, ROUTINE W REFLEX MICROSCOPIC  TROPONIN I (HIGH SENSITIVITY)  TROPONIN I (HIGH SENSITIVITY)    EKG EKG Interpretation  Date/Time:  Tuesday October 30 2018 18:18:15 EDT Ventricular Rate:  64 PR Interval:  172 QRS  Duration: 126 QT Interval:  384 QTC  Calculation: 396 R Axis:   -62 Text Interpretation: Normal sinus rhythm Left axis deviation Left ventricular hypertrophy with QRS widening and repolarization abnormality ( R in aVL , Cornell product ) Cannot rule out Septal infarct , age undetermined Abnormal ECG When compared to prior, no significant cahnges seen, No STEMI Confirmed by Antony Blackbird [phone]) on 10/30/2018 6:29:29 PM   Radiology Dg Chest 2 View  Result Date: 10/30/2018 CLINICAL DATA:  Fatigue and syncope EXAM: CHEST - 2 VIEW COMPARISON:  December 15, 2017 FINDINGS: No edema or consolidation. Heart is upper normal in size with pulmonary vascularity normal. No adenopathy. There is aortic atherosclerosis. No evident bone lesions. IMPRESSION: No edema or consolidation. Heart upper normal in size. Aortic Atherosclerosis (ICD10-I70.0). Electronically Signed   By: Lowella Grip III M.D.   On: 10/30/2018 19:51    Procedures Procedures (including critical care time)  Medications Ordered in ED Medications  sodium chloride flush (NS) 0.9 % injection 3 mL (3 mLs Intravenous Given 10/30/18 2227)     Initial Impression / Assessment and Plan / ED Course  I have reviewed the triage vital signs and the nursing notes.  Pertinent labs & imaging results that were available during my care of the patient were reviewed by me and considered in my medical decision making (see chart for details).        Meily Glowacki is a 78 y.o. female with a past medical history significant for hypertension, pulmonary embolism on Coumadin, CHF, and hypothyroidism who presents for recurrent syncope and hypotension.  Patient reports that this is the third episode of syncope in the last month she is concerned about it.  Patient is noted by daughter who reports that mother was feeling somewhat fatigued today and when she came over to give the patient some food, she walked in and when she turned around, the patient was unresponsive in a chair.  This lasted for  about 30 seconds and then the patient began to wake up.  No loss of bowel or bladder function and patient did not shake.  Patient was found by EMS to have blood pressure of 68 systolic and then after some fluids began to improve.  She denied any preceding symptoms of fatigue specifically, no chest pain, palpitations, no shortness of breath.  Patient denies recent fevers, chills, urinary symptoms or GI symptoms.  She describes "feeling sick".  She did have nausea and vomiting after the syncopal episode.  Chart review as the patient had a similar episode last week and after evaluation was able to go home.  Patient reports that this is the third time this is happened.  On exam, chest is nontender, back is nontender.  Lungs clear.  No murmur.  Abdomen nontender.  Good pulses in extremities.  Patient resting comfortably.  EKG shows no STEMI.  Clinical I am concerned about the patient having history of CHF, multiple syncopal episodes, and hypotension with unresponsiveness.  Patient denies new leg pain or leg swelling and is compliant with her Coumadin.  Low suspicion that this was related to a pulmonary realism with lack of chest pain, or shortness of breath.  We will check labs including TSH and electrolytes/troponin.  Will have work-up for occult infection.  Blood pressure will be monitored.  Anticipate admission for the high risk syncopal episodes recurring.  11:04 PM Work-up began to return showing elevation in BNP but no prior to compare.  Initial troponin negative.  TSH normal.  Magnesium slightly low.  Other labs reassuring.  Chest x-ray shows no significant edema or consolidation.  Due to the patient's multiple syncopal episodes and hypotension with history of CHF, do not feel patient is safe to go home at this time.  Patient will be admitted for likely echo and further evaluation.   Final Clinical Impressions(s) / ED Diagnoses   Final diagnoses:  Syncope, unspecified syncope type   Hypotension, unspecified hypotension type  Fatigue, unspecified type    Clinical Impression: 1. Syncope, unspecified syncope type   2. Hypotension, unspecified hypotension type   3. Fatigue, unspecified type     Disposition: Admit  This note was prepared with assistance of Dragon voice recognition software. Occasional wrong-word or sound-a-like substitutions may have occurred due to the inherent limitations of voice recognition software.      Tegeler, Gwenyth Allegra, MD 10/31/18 [phone]

## 2018-10-31 ENCOUNTER — Observation Stay (HOSPITAL_COMMUNITY): Payer: Medicare Other

## 2018-10-31 ENCOUNTER — Other Ambulatory Visit: Payer: Self-pay

## 2018-10-31 ENCOUNTER — Encounter (HOSPITAL_COMMUNITY): Payer: Self-pay | Admitting: Internal Medicine

## 2018-10-31 ENCOUNTER — Inpatient Hospital Stay (HOSPITAL_COMMUNITY): Payer: Medicare Other

## 2018-10-31 DIAGNOSIS — Z880 Allergy status to penicillin: Secondary | ICD-10-CM | POA: Diagnosis not present

## 2018-10-31 DIAGNOSIS — Z9049 Acquired absence of other specified parts of digestive tract: Secondary | ICD-10-CM | POA: Diagnosis not present

## 2018-10-31 DIAGNOSIS — Z7401 Bed confinement status: Secondary | ICD-10-CM | POA: Diagnosis not present

## 2018-10-31 DIAGNOSIS — Z20828 Contact with and (suspected) exposure to other viral communicable diseases: Secondary | ICD-10-CM | POA: Diagnosis present

## 2018-10-31 DIAGNOSIS — R55 Syncope and collapse: Secondary | ICD-10-CM | POA: Diagnosis not present

## 2018-10-31 DIAGNOSIS — Z79899 Other long term (current) drug therapy: Secondary | ICD-10-CM | POA: Diagnosis not present

## 2018-10-31 DIAGNOSIS — Z7901 Long term (current) use of anticoagulants: Secondary | ICD-10-CM | POA: Diagnosis not present

## 2018-10-31 DIAGNOSIS — Z881 Allergy status to other antibiotic agents status: Secondary | ICD-10-CM | POA: Diagnosis not present

## 2018-10-31 DIAGNOSIS — Z79890 Hormone replacement therapy (postmenopausal): Secondary | ICD-10-CM | POA: Diagnosis not present

## 2018-10-31 DIAGNOSIS — I361 Nonrheumatic tricuspid (valve) insufficiency: Secondary | ICD-10-CM

## 2018-10-31 DIAGNOSIS — E1142 Type 2 diabetes mellitus with diabetic polyneuropathy: Secondary | ICD-10-CM | POA: Diagnosis present

## 2018-10-31 DIAGNOSIS — I5032 Chronic diastolic (congestive) heart failure: Secondary | ICD-10-CM | POA: Diagnosis not present

## 2018-10-31 DIAGNOSIS — Z7984 Long term (current) use of oral hypoglycemic drugs: Secondary | ICD-10-CM | POA: Diagnosis not present

## 2018-10-31 DIAGNOSIS — R262 Difficulty in walking, not elsewhere classified: Secondary | ICD-10-CM | POA: Diagnosis not present

## 2018-10-31 DIAGNOSIS — E039 Hypothyroidism, unspecified: Secondary | ICD-10-CM | POA: Diagnosis present

## 2018-10-31 DIAGNOSIS — I340 Nonrheumatic mitral (valve) insufficiency: Secondary | ICD-10-CM | POA: Diagnosis not present

## 2018-10-31 DIAGNOSIS — R296 Repeated falls: Secondary | ICD-10-CM | POA: Diagnosis present

## 2018-10-31 DIAGNOSIS — Z933 Colostomy status: Secondary | ICD-10-CM | POA: Diagnosis not present

## 2018-10-31 DIAGNOSIS — Z888 Allergy status to other drugs, medicaments and biological substances status: Secondary | ICD-10-CM | POA: Diagnosis not present

## 2018-10-31 DIAGNOSIS — M1990 Unspecified osteoarthritis, unspecified site: Secondary | ICD-10-CM | POA: Diagnosis not present

## 2018-10-31 DIAGNOSIS — I272 Pulmonary hypertension, unspecified: Secondary | ICD-10-CM | POA: Diagnosis present

## 2018-10-31 DIAGNOSIS — R278 Other lack of coordination: Secondary | ICD-10-CM | POA: Diagnosis not present

## 2018-10-31 DIAGNOSIS — Z8674 Personal history of sudden cardiac arrest: Secondary | ICD-10-CM | POA: Diagnosis not present

## 2018-10-31 DIAGNOSIS — E1122 Type 2 diabetes mellitus with diabetic chronic kidney disease: Secondary | ICD-10-CM | POA: Diagnosis present

## 2018-10-31 DIAGNOSIS — Z743 Need for continuous supervision: Secondary | ICD-10-CM | POA: Diagnosis not present

## 2018-10-31 DIAGNOSIS — I130 Hypertensive heart and chronic kidney disease with heart failure and stage 1 through stage 4 chronic kidney disease, or unspecified chronic kidney disease: Secondary | ICD-10-CM | POA: Diagnosis not present

## 2018-10-31 DIAGNOSIS — Z87891 Personal history of nicotine dependence: Secondary | ICD-10-CM | POA: Diagnosis not present

## 2018-10-31 DIAGNOSIS — E860 Dehydration: Secondary | ICD-10-CM | POA: Diagnosis not present

## 2018-10-31 DIAGNOSIS — E785 Hyperlipidemia, unspecified: Secondary | ICD-10-CM | POA: Diagnosis present

## 2018-10-31 DIAGNOSIS — Z86711 Personal history of pulmonary embolism: Secondary | ICD-10-CM | POA: Diagnosis not present

## 2018-10-31 DIAGNOSIS — I951 Orthostatic hypotension: Secondary | ICD-10-CM | POA: Diagnosis not present

## 2018-10-31 DIAGNOSIS — M2550 Pain in unspecified joint: Secondary | ICD-10-CM | POA: Diagnosis not present

## 2018-10-31 DIAGNOSIS — I959 Hypotension, unspecified: Secondary | ICD-10-CM | POA: Diagnosis present

## 2018-10-31 DIAGNOSIS — N183 Chronic kidney disease, stage 3 unspecified: Secondary | ICD-10-CM | POA: Diagnosis present

## 2018-10-31 DIAGNOSIS — M6281 Muscle weakness (generalized): Secondary | ICD-10-CM | POA: Diagnosis not present

## 2018-10-31 LAB — GLUCOSE, CAPILLARY
Glucose-Capillary: 100 mg/dL — ABNORMAL HIGH (ref 70–99)
Glucose-Capillary: 151 mg/dL — ABNORMAL HIGH (ref 70–99)
Glucose-Capillary: 121 mg/dL — ABNORMAL HIGH (ref 70–99)

## 2018-10-31 LAB — PROTIME-INR
INR: 1.9 — ABNORMAL HIGH (ref 0.8–1.2)
Prothrombin Time: 21.5 seconds — ABNORMAL HIGH (ref 11.4–15.2)

## 2018-10-31 LAB — SARS CORONAVIRUS 2 (TAT 6-24 HRS): SARS Coronavirus 2: NEGATIVE

## 2018-10-31 LAB — ECHOCARDIOGRAM COMPLETE
Height: 67 in
Weight: 2624 oz

## 2018-10-31 LAB — CBG MONITORING, ED: Glucose-Capillary: 91 mg/dL (ref 70–99)

## 2018-10-31 MED ORDER — ONDANSETRON HCL 4 MG/2ML IJ SOLN: 4.0000 mg | Freq: Four times a day (QID) | INTRAMUSCULAR | Status: DC | PRN

## 2018-10-31 MED ORDER — ONDANSETRON HCL 4 MG PO TABS: 4.0000 mg | ORAL_TABLET | Freq: Four times a day (QID) | ORAL | Status: DC | PRN

## 2018-10-31 MED ORDER — SIMVASTATIN 20 MG PO TABS
20.0000 mg | ORAL_TABLET | Freq: Every day | ORAL | Status: DC
  Administered 2018-10-31 – 2018-11-01 (×2): 20 mg via ORAL
  Filled 2018-10-31 (×2): qty 1

## 2018-10-31 MED ORDER — LEVOTHYROXINE SODIUM 25 MCG PO TABS
25.0000 ug | ORAL_TABLET | Freq: Every day | ORAL | Status: DC
  Administered 2018-11-01 – 2018-11-02 (×2): 25 ug via ORAL
  Filled 2018-10-31 (×2): qty 1

## 2018-10-31 MED ORDER — WARFARIN SODIUM 5 MG PO TABS
5.0000 mg | ORAL_TABLET | Freq: Once | ORAL | Status: AC
  Administered 2018-10-31: 5 mg via ORAL
  Filled 2018-10-31: qty 1

## 2018-10-31 MED ORDER — INSULIN ASPART 100 UNIT/ML ~~LOC~~ SOLN
0.0000 [IU] | Freq: Three times a day (TID) | SUBCUTANEOUS | Status: DC
  Administered 2018-10-31: 2 [IU] via SUBCUTANEOUS
  Administered 2018-11-01 – 2018-11-02 (×2): 1 [IU] via SUBCUTANEOUS

## 2018-10-31 MED ORDER — FUROSEMIDE 20 MG PO TABS: 20.0000 mg | ORAL_TABLET | Freq: Every day | ORAL | Status: DC | PRN

## 2018-10-31 MED ORDER — VITAMIN C 500 MG PO TABS
500.0000 mg | ORAL_TABLET | Freq: Every day | ORAL | Status: DC
  Administered 2018-10-31 – 2018-11-02 (×3): 500 mg via ORAL
  Filled 2018-10-31 (×3): qty 1

## 2018-10-31 MED ORDER — LISINOPRIL 40 MG PO TABS
40.0000 mg | ORAL_TABLET | Freq: Every day | ORAL | Status: DC
  Administered 2018-10-31 – 2018-11-02 (×3): 40 mg via ORAL
  Filled 2018-10-31 (×3): qty 1

## 2018-10-31 MED ORDER — MAGNESIUM SULFATE 2 GM/50ML IV SOLN
2.0000 g | Freq: Once | INTRAVENOUS | Status: AC
  Administered 2018-10-31: 2 g via INTRAVENOUS
  Filled 2018-10-31: qty 50

## 2018-10-31 MED ORDER — VITAMIN B-12 1000 MCG PO TABS
1000.0000 ug | ORAL_TABLET | Freq: Every day | ORAL | Status: DC
  Administered 2018-10-31 – 2018-11-02 (×3): 1000 ug via ORAL
  Filled 2018-10-31 (×3): qty 1

## 2018-10-31 MED ORDER — ACETAMINOPHEN 325 MG PO TABS
650.0000 mg | ORAL_TABLET | Freq: Four times a day (QID) | ORAL | Status: DC | PRN
  Administered 2018-10-31: 650 mg via ORAL
  Filled 2018-10-31: qty 2

## 2018-10-31 MED ORDER — ACETAMINOPHEN 650 MG RE SUPP: 650.0000 mg | Freq: Four times a day (QID) | RECTAL | Status: DC | PRN

## 2018-10-31 MED ORDER — AMLODIPINE BESYLATE 2.5 MG PO TABS
2.5000 mg | ORAL_TABLET | Freq: Every day | ORAL | Status: DC
  Administered 2018-10-31 – 2018-11-01 (×2): 2.5 mg via ORAL
  Filled 2018-10-31 (×4): qty 1

## 2018-10-31 MED ORDER — METOPROLOL TARTRATE 25 MG PO TABS
25.0000 mg | ORAL_TABLET | Freq: Two times a day (BID) | ORAL | Status: DC
  Administered 2018-10-31 – 2018-11-02 (×4): 25 mg via ORAL
  Filled 2018-10-31 (×5): qty 1

## 2018-10-31 NOTE — Progress Notes (Signed)
  Echocardiogram 2D Echocardiogram has been performed.  Jaggar Benko L Androw 10/31/2018, 3:42 PM

## 2018-10-31 NOTE — Progress Notes (Addendum)
PROGRESS NOTE    Hannah [name]  OEU:[id number] DOB: December 22, 1940 DOA: 10/30/2018 PCP: Wynn Banker, MD   Brief Narrative:  Hannah [name] is a 78 y.o. female with history of pulmonary embolism on Coumadin, diabetes mellitus type 2, hypertension, large bowel obstruction which status post colectomy with colostomy was brought to the ER after patient had a witnessed syncopal episode.  Patient daughter states that last evening when patient was brought dinner at her home by the daughter before she could eat patient had a brief loss of consciousness while in the sitting position lasted for few seconds and regained consciousness.  Did not have any seizure-like episode or any incontinence of bowel or tongue bite.  Did have one episode of vomiting.  On October 18 10 days ago patient and family went to restroom and similar episode happened at the time patient was brought to the ER and was referred to the neurologist.  Neurology is planning to get an MRI of the brain and EEG as outpatient.  Which is still pending.  Patient did not have any chest pain or palpitation or shortness of breath.  Was recently started on amlodipine for uncontrolled blood pressure.  Patient had complained of some dizziness while taking amlodipine at stopped off-and-on for this reason but still continues to take.  EMS on arrival found that patient blood pressure is in the low 60s systolic was given 500 cc normal saline bolus following which blood pressure improved.  Blood sugar was more than 200.  ED Course: In the ER patient is alert awake oriented moving all extremities.  EKG shows normal sinus rhythm with QTC of 396 ms.  High-sensitivity troponin was 6 and 8.  Hemoglobin 12.1 platelets 240 creatinine at baseline of 1.1.  Patient admitted for further management of syncopal episode.  Per daughter this is her third syncopal episode since May.  Last 1 was 10 days ago and another 1 in May 2020.  Assessment & Plan:   Principal Problem:    Syncope Active Problems:   DM2 (diabetes mellitus, type 2) (HCC)   Hypothyroid   Essential hypertension   Pulmonary hypertension (HCC)   Chronic diastolic CHF (congestive heart failure) (HCC)   Large bowel stricture (HCC)  Syncope: Source unknown.  CT and MRI of the brain unremarkable.  Echo pending.  She does not have any focal deficit.  Apparently she was hypotensive upon presentation yesterday and she is hypertensive right now.  Wondering if she has orthostatic hypotension causing syncope.  Will monitor orthostatic vitals every 8 hours.  No arrhythmia noted on the telemetry.  Monitor on telemetry.  Frequent falls: Per patient, she has fallen approximately 4 times in last 1 month.  Source unknown, could be overall declining health versus orthostatic hypotension.  Will consult PT OT to assess her.  She needs to stay in the hospital for further assessment.  Essential hypertension: Reportedly, she was slightly hypotensive upon presentation yesterday.  However blood pressure slightly elevated now.  Her metoprolol and lisinopril were resumed.  I will resume her amlodipine.  Continue as needed hydralazine.  Hypothyroidism: Continue Synthroid.  Type 2 diabetes mellitus: Controlled.  Continue SSI.  History of PE: On Coumadin and INR therapeutic.  Continue Coumadin per pharmacy.  History of large bowel stricture status post surgery and colostomy.  Noted.  CKD stage III: At baseline.  Continue to watch.  DVT prophylaxis: Lovenox Code Status: Full code Family Communication:  None present at bedside.  Plan of care discussed with patient in  length and he verbalized understanding and agreed with it. Disposition Plan: Potential discharge tomorrow.  Estimated body mass index is 25.69 kg/m as calculated from the following:   Height as of this encounter: 5\' 7"  (1.702 m).   Weight as of this encounter: 74.4 kg.      Nutritional status:               Consultants:    None  Procedures:   None  Antimicrobials:   None   Subjective: Seen and examined.  Feels better.  No complaints or any focal deficit.  Concerned about 4 falls last 1 month.  Objective: Vitals:   10/30/18 1824 10/31/18 0254 10/31/18 1218 10/31/18 1233  BP: (!) 179/79 (!) 174/93 (!) 151/86 (!) 155/99  Pulse: 71 67 71 75  Resp: 18 16 14 20   Temp: 98.3 F (36.8 C)   98.5 F (36.9 C)  TempSrc: Oral   Oral  SpO2: 100% 100% 100% 100%  Weight:      Height:       No intake or output data in the 24 hours ending 10/31/18 1328 Filed Weights   10/30/18 1821  Weight: 74.4 kg    Examination:  General exam: Appears calm and comfortable  Respiratory system: Clear to auscultation. Respiratory effort normal. Cardiovascular system: S1 & S2 heard, RRR. No JVD, murmurs, rubs, gallops or clicks. No pedal edema. Gastrointestinal system: Abdomen is nondistended, soft and nontender. No organomegaly or masses felt. Normal bowel sounds heard. Central nervous system: Alert and oriented. No focal neurological deficits. Extremities: Symmetric 5 x 5 power. Skin: No rashes, lesions or ulcers Psychiatry: Judgement and insight appear normal. Mood & affect appropriate.    Data Reviewed: I have personally reviewed following labs and imaging studies  CBC: Recent Labs  Lab 10/30/18 1824  WBC 7.5  HGB 12.1  HCT 40.3  MCV 93.5  PLT 161   Basic Metabolic Panel: Recent Labs  Lab 10/30/18 1824 10/30/18 1849  NA 136  --   K 3.7  --   CL 105  --   CO2 25  --   GLUCOSE 263*  --   BUN 13  --   CREATININE 1.19*  --   CALCIUM 8.8*  --   MG  --  1.6*   GFR: Estimated Creatinine Clearance: 41.7 mL/min (A) (by C-G formula based on SCr of 1.19 mg/dL (H)). Liver Function Tests: No results for input(s): AST, ALT, ALKPHOS, BILITOT, PROT, ALBUMIN in the last 168 hours. No results for input(s): LIPASE, AMYLASE in the last 168 hours. No results for input(s): AMMONIA in the last 168  hours. Coagulation Profile: Recent Labs  Lab 10/30/18 1849  INR 2.0*   Cardiac Enzymes: No results for input(s): CKTOTAL, CKMB, CKMBINDEX, TROPONINI in the last 168 hours. BNP (last 3 results) No results for input(s): PROBNP in the last 8760 hours. HbA1C: No results for input(s): HGBA1C in the last 72 hours. CBG: Recent Labs  Lab 10/30/18 1827 10/31/18 0837 10/31/18 1246  GLUCAP 240* 91 100*   Lipid Profile: No results for input(s): CHOL, HDL, LDLCALC, TRIG, CHOLHDL, LDLDIRECT in the last 72 hours. Thyroid Function Tests: Recent Labs    10/30/18 1850  TSH 4.408   Anemia Panel: No results for input(s): VITAMINB12, FOLATE, FERRITIN, TIBC, IRON, RETICCTPCT in the last 72 hours. Sepsis Labs: No results for input(s): PROCALCITON, LATICACIDVEN in the last 168 hours.  Recent Results (from the past 240 hour(s))  SARS CORONAVIRUS 2 (TAT 6-24 HRS) Nasopharyngeal  Nasopharyngeal Swab     Status: None   Collection Time: 10/31/18 12:02 AM   Specimen: Nasopharyngeal Swab  Result Value Ref Range Status   SARS Coronavirus 2 NEGATIVE NEGATIVE Final    Comment: (NOTE) SARS-CoV-2 target nucleic acids are NOT DETECTED. The SARS-CoV-2 RNA is generally detectable in upper and lower respiratory specimens during the acute phase of infection. Negative results do not preclude SARS-CoV-2 infection, do not rule out co-infections with other pathogens, and should not be used as the sole basis for treatment or other patient management decisions. Negative results must be combined with clinical observations, patient history, and epidemiological information. The expected result is Negative. Fact Sheet for Patients: HairSlick.nohttps://www.fda.gov/media/138098/download Fact Sheet for Healthcare Providers: quierodirigir.comhttps://www.fda.gov/media/138095/download This test is not yet approved or cleared by the [place] FDA and  has been authorized for detection and/or diagnosis of SARS-CoV-2 by FDA under an Emergency  Use Authorization (EUA). This EUA will remain  in effect (meaning this test can be used) for the duration of the COVID-19 declaration under Section 56 4(b)(1) of the Act, 21 U.S.C. section 360bbb-3(b)(1), unless the authorization is terminated or revoked sooner. Performed at [facility], [address] [place], [place] [address]       Radiology Studies: Dg Chest 2 View  Result Date: 10/30/2018 CLINICAL DATA:  Fatigue and syncope EXAM: CHEST - 2 VIEW COMPARISON:  December 15, 2017 FINDINGS: No edema or consolidation. Heart is upper normal in size with pulmonary vascularity normal. No adenopathy. There is aortic atherosclerosis. No evident bone lesions. IMPRESSION: No edema or consolidation. Heart upper normal in size. Aortic Atherosclerosis (ICD10-I70.0). Electronically Signed   By: Bretta BangWilliam  Woodruff III M.D.   On: 10/30/2018 19:51   Mr Brain Wo Contrast  Result Date: 10/31/2018 CLINICAL DATA:  Syncope and nausea EXAM: MRI HEAD WITHOUT CONTRAST TECHNIQUE: Multiplanar, multiecho pulse sequences of the brain and surrounding structures were obtained without intravenous contrast. COMPARISON:  Head CT 05/21/2018 FINDINGS: Brain: No acute infarction, hemorrhage, hydrocephalus, extra-axial collection or mass lesion. Prominent atrophy with variable subarachnoid space enlargement greatest along the sylvian fissures and anterior interhemispheric fissure. No vessel distortion to imply discrete collection. Chronic small vessel ischemia in the cerebral white matter and pons, overall mild for age. Vascular: Normal flow voids Skull and upper cervical spine: Negative for marrow lesion Sinuses/Orbits: Negative IMPRESSION: 1. No acute or reversible finding. 2. Cerebral atrophy and chronic small vessel ischemia. Electronically Signed   By: Marnee SpringJonathon  Watts M.D.   On: 10/31/2018 06:43    Scheduled Meds: . insulin aspart  0-9 Units Subcutaneous TID WC  . levothyroxine  25 mcg Oral Q0600  . lisinopril  40  mg Oral Daily  . metoprolol tartrate  25 mg Oral BID  . simvastatin  20 mg Oral QHS  . vitamin B-12  1,000 mcg Oral Daily  . vitamin C  500 mg Oral Daily  . warfarin  5 mg Oral Once per day on Sun Tue Thu   And  . warfarin  2.5 mg Oral Once per day on Mon Wed Fri Sat  . Warfarin - Pharmacist Dosing Inpatient   Does not apply q1800   Continuous Infusions: . magnesium sulfate bolus IVPB       LOS: 0 days   Time spent: 33 minutes   Hughie Clossavi Berk Pilot, MD [facility]  10/31/2018, 1:28 PM   To contact the attending provider between 7A-7P or the covering provider during after hours 7P-7A, please log into the web site [email or web address]  and use password [email or web address].

## 2018-10-31 NOTE — ED Notes (Signed)
Pt transported to EEG 

## 2018-10-31 NOTE — Evaluation (Signed)
Physical Therapy Evaluation Patient Details Name: Hannah [name] MRN: [id number] DOB: Dec 17, 1940 Today's Date: 10/31/2018   History of Present Illness  78 yo female with onset of syncopal episode at home resulting in trip to [place].  Has had repetitive syncopal events, and has been noted to have high BP as well recently.  MRI of brain is negative, Covid (-).  PMHx:  OA hips, atherosclerosis, colostomy, HTN, pulm HTN, hypothyroidism, CHF, PE, DM, MI, IVC filter placement, coumadin use  Clinical Impression  Pt was seen for mobility of gait and to assess her ability to judge safety.  Pt is requiring some steadying with all movement, has some strength losses of legs and is unsafe navigating obstacles, tending to get close on RW.  PLOF was to use [facility] or rollator, so will consider these as devices but RW is more appropriate now.  Follow up with expectation that her family and/or friends will help 24/7, and if this is not practical would recommend a stay in rehab placement.      Follow Up Recommendations Home health PT;Supervision for mobility/OOB;Supervision/Assistance - 24 hour    Equipment Recommendations  Rolling walker with 5" wheels    Recommendations for Other Services       Precautions / Restrictions Precautions Precautions: Fall Precaution Comments: notes occasional R hand tremor Restrictions Weight Bearing Restrictions: No      Mobility  Bed Mobility Overal bed mobility: Needs Assistance Bed Mobility: Supine to Sit     Supine to sit: Min assist     General bed mobility comments: min assist to support trunk and pt uses bedrail  Transfers Overall transfer level: Needs assistance Equipment used: Rolling walker (2 wheeled);1 person hand held assist Transfers: Sit to/from Stand Sit to Stand: Min guard         General transfer comment: min guard for safety  Ambulation/Gait Ambulation/Gait assistance: Min guard Gait Distance (Feet): 120 Feet Assistive device: Rolling  walker (2 wheeled) Gait Pattern/deviations: Step-through pattern;Decreased stride length;Wide base of support;Trunk flexed Gait velocity: reduced Gait velocity interpretation: <1.31 ft/sec, indicative of household ambulator General Gait Details: pt drifts to the L often coming close to obstacles and needs frequent assist or reminders  Stairs            Wheelchair Mobility    Modified Rankin (Stroke Patients Only)       Balance Overall balance assessment: Needs assistance Sitting-balance support: Feet supported Sitting balance-Leahy Scale: Good     Standing balance support: Bilateral upper extremity supported;During functional activity Standing balance-Leahy Scale: Poor                               Pertinent Vitals/Pain Pain Assessment: No/denies pain    Home Living Family/patient expects to be discharged to:: Private residence Living Arrangements: Alone Available Help at Discharge: Family;Available PRN/intermittently Type of Home: House Home Access: Stairs to enter Entrance Stairs-Rails: Right;Left;Can reach both Entrance Stairs-Number of Steps: 5 Home Layout: One level Home Equipment: Cane - single point;Walker - 4 wheels;Bedside commode;Shower seat Additional Comments: life alert    Prior Function Level of Independence: Independent with assistive device(s)         Comments: was living with daughters recently     Hand Dominance   Dominant Hand: Right    Extremity/Trunk Assessment   Upper Extremity Assessment Upper Extremity Assessment: Generalized weakness    Lower Extremity Assessment Lower Extremity Assessment: Generalized weakness    Cervical /  Trunk Assessment Cervical / Trunk Assessment: Normal  Communication   Communication: No difficulties  Cognition Arousal/Alertness: Awake/alert Behavior During Therapy: WFL for tasks assessed/performed Overall Cognitive Status: No family/caregiver present to determine baseline cognitive  functioning                                 General Comments: history obtained in part from chart, could not give a full accurate history      General Comments General comments (skin integrity, edema, etc.): assisted to sit up in chair with legs elevated, requires some close monitoring as pt is not fully aware of her abilities and needs    Exercises     Assessment/Plan    PT Assessment Patient needs continued PT services  PT Problem List Decreased strength;Decreased range of motion;Decreased activity tolerance;Decreased balance;Decreased mobility;Decreased coordination;Decreased cognition;Decreased knowledge of use of DME;Decreased safety awareness       PT Treatment Interventions DME instruction;Gait training;Stair training;Functional mobility training;Therapeutic activities;Therapeutic exercise;Balance training;Neuromuscular re-education;Patient/family education    PT Goals (Current goals can be found in the Care Plan section)  Acute Rehab PT Goals Patient Stated Goal: to go home to her house PT Goal Formulation: With patient Time For Goal Achievement: 11/14/18 Potential to Achieve Goals: Good    Frequency Min 3X/week   Barriers to discharge Decreased caregiver support;Inaccessible home environment stairs to enter her house and has no family living with her    Co-evaluation               AM-PAC PT "6 Clicks" Mobility  Outcome Measure Help needed turning from your back to your side while in a flat bed without using bedrails?: A Little Help needed moving from lying on your back to sitting on the side of a flat bed without using bedrails?: A Little Help needed moving to and from a bed to a chair (including a wheelchair)?: A Little Help needed standing up from a chair using your arms (e.g., wheelchair or bedside chair)?: A Little Help needed to walk in hospital room?: A Little Help needed climbing 3-5 steps with a railing? : A Lot 6 Click Score: 17     End of Session   Activity Tolerance: Patient limited by fatigue Patient left: in chair;with call bell/phone within reach(legs elevated) Nurse Communication: Mobility status PT Visit Diagnosis: Unsteadiness on feet (R26.81);Muscle weakness (generalized) (M62.81)    Time: 4315-4008 PT Time Calculation (min) (ACUTE ONLY): 27 min   Charges:   PT Evaluation $PT Eval Moderate Complexity: 1 Mod PT Treatments $Gait Training: 8-22 mins       Ivar Drape 10/31/2018, 6:31 PM   Samul Dada, PT MS Acute Rehab Dept. Number: [facility] [phone] and [facility] [phone]

## 2018-10-31 NOTE — H&P (Addendum)
History and Physical    Tilla Wilborn DTO:[id number] DOB: 21-Jan-1940 DOA: 10/30/2018  PCP: [name], MD  Patient coming from: Home.  Chief Complaint: Loss of consciousness.  HPI: Chad Tiznado is a 78 y.o. female with history of pulmonary embolism on Coumadin, diabetes mellitus type 2, hypertension, large bowel obstruction which status post colectomy with colostomy was brought to the ER after patient had a witnessed syncopal episode.  Patient daughter states that last evening when patient was brought dinner at her home by the daughter before she could eat patient had a brief loss of consciousness while in the sitting position lasted for few seconds and regained consciousness.  Did not have any seizure-like episode or any incontinence of bowel or tongue bite.  Did have one episode of vomiting.  On October 18 10 days ago patient and family went to restroom and similar episode happened at the time patient was brought to the ER and was referred to the neurologist.  Neurology is planning to get an MRI of the brain and EEG as outpatient.  Which is still pending.  Patient did not have any chest pain or palpitation or shortness of breath.  Was recently started on amlodipine for uncontrolled blood pressure.  Patient had complained of some dizziness while taking amlodipine at stopped off-and-on for this reason but still continues to take.  EMS on arrival found that patient blood pressure is in the low 98P systolic was given 382 cc normal saline bolus following which blood pressure improved.  Blood sugar was more than 200.  ED Course: In the ER patient is alert awake oriented moving all extremities.  EKG shows normal sinus rhythm with QTC of 396 ms.  High-sensitivity troponin was 6 and 8.  Hemoglobin 12.1 platelets 240 creatinine at baseline of 1.1.  Patient admitted for further management of syncopal episode.  Per daughter this is her third syncopal episode since May.  Last 1 was 10 days ago and another  1 in May 2020.  Review of Systems: As per HPI, rest all negative.   Past Medical History:  Diagnosis Date  . Acute massive pulmonary embolism ([place]) 07/13/2012   Massive PE w/ PEA arrest 07/13/12 >TNK >IVC filter >discharged on comadin    . Diabetes mellitus, type 2 ([place]) 07/15/2012   with peripheral neuropathy  . Hyperlipemia 07/14/2012  . Hypertension 07/14/2012  . Large bowel stricture ([facility])    s/p colectomy in 2016  . Osteoarthritis    s/p hip and knee replacements  . Thyroid disease     Past Surgical History:  Procedure Laterality Date  . COLONOSCOPY N/A 03/12/2016   Procedure: COLONOSCOPY;  Surgeon: Teena Irani, MD;  Location: [facility] ENDOSCOPY;  Service: Endoscopy;  Laterality: N/A;  . COLOSTOMY N/A 06/03/2014   Procedure: COLOSTOMY;  Surgeon: Erroll Luna, MD;  Location: [place];  Service: General;  Laterality: N/A;  . FLEXIBLE SIGMOIDOSCOPY N/A 05/30/2014   Procedure: Beryle Quant;  Surgeon: Carol Ada, MD;  Location: [facility] ENDOSCOPY;  Service: Endoscopy;  Laterality: N/A;  . INSERTION OF VENA CAVA FILTER N/A 07/16/2012   Procedure: INSERTION OF VENA CAVA FILTER;  Surgeon: Elam Dutch, MD;  Location: [facility] CATH LAB;  Service: Cardiovascular;  Laterality: N/A;  . KNEE ARTHROSCOPY    . PARTIAL COLECTOMY N/A 06/03/2014   Procedure: PARTIAL COLECTOMY;  Surgeon: Erroll Luna, MD;  Location: [place];  Service: General;  Laterality: N/A;  . REDUCTION MAMMAPLASTY Bilateral   . SP ARTHRO HIP*L*  reports that she quit smoking about 50 years ago. Her smoking use included cigarettes. She has a 10.00 pack-year smoking history. She has never used smokeless tobacco. She reports that she does not drink alcohol or use drugs.  Allergies  Allergen Reactions  . Augmentin [Amoxicillin-Pot Clavulanate] Itching and Other (See Comments)    Severe vaginal itching  . Tranxene [Clorazepate] Itching  . Penicillins Itching and Rash    Has patient had a PCN reaction causing immediate rash,  facial/tongue/throat swelling, SOB or lightheadedness with hypotension: Yes Has patient had a PCN reaction causing severe rash involving mucus membranes or skin necrosis: No Has patient had a PCN reaction that required hospitalization: No Has patient had a PCN reaction occurring within the last 10 years: No If all of the above answers are "NO", then may proceed with Cephalosporin use.     Family History  Problem Relation Age of Onset  . Breast cancer Mother     Prior to Admission medications   Medication Sig Start Date End Date Taking? Authorizing Provider  acetaminophen (TYLENOL) 500 MG tablet Take 500 mg by mouth every 6 (six) hours as needed for mild pain.    Yes [provider]  amLODipine (NORVASC) 2.5 MG tablet Take 1 tablet (2.5 mg total) by mouth at bedtime. 09/21/18  Yes Koberlein, Junell C, MD  Cyanocobalamin (VITAMIN B-12 PO) Take 1 tablet by mouth daily.   Yes [provider]  furosemide (LASIX) 20 MG tablet Take 1 tablet (20 mg total) by mouth daily as needed for fluid or edema. 06/28/18  Yes Lucretia Kern, DO  levothyroxine (SYNTHROID) 25 MCG tablet TAKE 1 TABLET BY MOUTH ONCE DAILY BEFORE BREAKFAST 05/23/18  Yes Koberlein, Junell C, MD  lisinopril (ZESTRIL) 40 MG tablet Take 1 tablet (40 mg total) by mouth daily. 09/12/18  Yes Koberlein, Junell C, MD  metFORMIN (GLUCOPHAGE) 1000 MG tablet TAKE ONE TABLET BY MOUTH WITH BREAKFAST AND TAKE ONE TABLET WITH DINNER 09/12/18  Yes Koberlein, Junell C, MD  metoprolol tartrate (LOPRESSOR) 25 MG tablet Take 1 tablet (25 mg total) by mouth 2 (two) times daily. 09/12/18  Yes Koberlein, Junell C, MD  polyethylene glycol (MIRALAX / GLYCOLAX) packet Take 17 g by mouth daily. Patient taking differently: Take 17 g by mouth daily as needed for moderate constipation.  03/19/17  Yes Elodia Florence., MD  simvastatin (ZOCOR) 20 MG tablet Take 1 tablet (20 mg total) by mouth at bedtime. 09/12/18  Yes Koberlein, Steele Berg, MD  vitamin C  (ASCORBIC ACID) 500 MG tablet Take 500 mg by mouth daily.    Yes [provider]  warfarin (COUMADIN) 5 MG tablet Take 1/2 or 1 tablet daily as directed by Coumadin clinic Patient taking differently: Take 2.5-5 mg by mouth See admin instructions. Take 1/2 or 1 tablet daily as directed by Coumadin clinic; 5 mg (5 mg x 1) every Sun, Tue, Thu; 2.5 mg (5 mg x 0.5) all other days 07/16/18  Yes Fay Records, MD  Blood Glucose Monitoring Suppl (ONETOUCH VERIO) w/Device KIT 1 each by Does not apply route as directed. 06/07/18   [name], MD  glucose blood (ONETOUCH VERIO) test strip Use as instructed once a day 09/19/17   Colin Benton R, DO  glucose blood test strip Use as instructed once a day 05/26/17   Lucretia Kern, DO  OneTouch Delica Lancets 29F MISC USE 1  TO CHECK GLUCOSE ONCE DAILY 07/02/18   Koberlein, Steele Berg,  MD    Physical Exam: Constitutional: Moderately built and nourished. Vitals:   10/30/18 1821 10/30/18 1824  BP:  (!) 179/79  Pulse:  71  Resp:  18  Temp:  98.3 F (36.8 C)  TempSrc:  Oral  SpO2:  100%  Weight: 74.4 kg   Height: '5\' 7"'  (1.702 m)    Eyes: Anicteric no pallor. ENMT: No discharge from the ears eyes nose or mouth. Neck: No mass felt.  No neck rigidity. Respiratory: No rhonchi or crepitations. Cardiovascular: S1-S2 heard. Abdomen: Soft nontender bowel sounds present. Musculoskeletal: No edema. Skin: No rash. Neurologic: Alert awake oriented to time place and person.  Moves all extremities. Psychiatric: Appears normal.   Labs on Admission: I have personally reviewed following labs and imaging studies  CBC: Recent Labs  Lab 10/30/18 1824  WBC 7.5  HGB 12.1  HCT 40.3  MCV 93.5  PLT 099   Basic Metabolic Panel: Recent Labs  Lab 10/30/18 1824 10/30/18 1849  NA 136  --   K 3.7  --   CL 105  --   CO2 25  --   GLUCOSE 263*  --   BUN 13  --   CREATININE 1.19*  --   CALCIUM 8.8*  --   MG  --  1.6*   GFR: Estimated Creatinine  Clearance: 41.7 mL/min (A) (by C-G formula based on SCr of 1.19 mg/dL (H)). Liver Function Tests: No results for input(s): AST, ALT, ALKPHOS, BILITOT, PROT, ALBUMIN in the last 168 hours. No results for input(s): LIPASE, AMYLASE in the last 168 hours. No results for input(s): AMMONIA in the last 168 hours. Coagulation Profile: Recent Labs  Lab 10/30/18 1849  INR 2.0*   Cardiac Enzymes: No results for input(s): CKTOTAL, CKMB, CKMBINDEX, TROPONINI in the last 168 hours. BNP (last 3 results) No results for input(s): PROBNP in the last 8760 hours. HbA1C: No results for input(s): HGBA1C in the last 72 hours. CBG: Recent Labs  Lab 10/30/18 1827  GLUCAP 240*   Lipid Profile: No results for input(s): CHOL, HDL, LDLCALC, TRIG, CHOLHDL, LDLDIRECT in the last 72 hours. Thyroid Function Tests: Recent Labs    10/30/18 1850  TSH 4.408   Anemia Panel: No results for input(s): VITAMINB12, FOLATE, FERRITIN, TIBC, IRON, RETICCTPCT in the last 72 hours. Urine analysis:    Component Value Date/Time   COLORURINE YELLOW 05/21/2018 0635   APPEARANCEUR HAZY (A) 05/21/2018 0635   LABSPEC 1.020 05/21/2018 0635   PHURINE 5.0 05/21/2018 0635   GLUCOSEU NEGATIVE 05/21/2018 0635   HGBUR NEGATIVE 05/21/2018 0635   BILIRUBINUR NEGATIVE 05/21/2018 0635   KETONESUR NEGATIVE 05/21/2018 0635   PROTEINUR NEGATIVE 05/21/2018 0635   UROBILINOGEN 0.2 03/14/2017 1102   NITRITE NEGATIVE 05/21/2018 0635   LEUKOCYTESUR NEGATIVE 05/21/2018 0635   Sepsis Labs: '@LABRCNTIP' (procalcitonin:4,lacticidven:4) )No results found for this or any previous visit (from the past 240 hour(s)).   Radiological Exams on Admission: Dg Chest 2 View  Result Date: 10/30/2018 CLINICAL DATA:  Fatigue and syncope EXAM: CHEST - 2 VIEW COMPARISON:  December 15, 2017 FINDINGS: No edema or consolidation. Heart is upper normal in size with pulmonary vascularity normal. No adenopathy. There is aortic atherosclerosis. No evident bone  lesions. IMPRESSION: No edema or consolidation. Heart upper normal in size. Aortic Atherosclerosis (ICD10-I70.0). Electronically Signed   By: Lowella Grip III M.D.   On: 10/30/2018 19:51    EKG: Independently reviewed.  Normal sinus rhythm LVH.  Assessment/Plan Principal Problem:   Syncope Active Problems:  DM2 (diabetes mellitus, type 2) (HCC)   Hypothyroid   Essential hypertension   Pulmonary hypertension (HCC)   Chronic diastolic CHF (congestive heart failure) (HCC)   Large bowel stricture ([place])    1. Syncope -cause not clear.  Patient did visit neurologist last week and plan was to get MRI brain and EEG which I have ordered.  Check 2D echo closely monitor in telemetry. 2. History of hypertension which has been uncontrolled recently and amlodipine was added recently.  But on presentation last night edema is found patient was mildly hypotensive which improved with fluids.  Presently blood pressure is more than 427 systolic.  We will slowly restart home medication for which I have ordered patient's beta-blocker ACE inhibitor.  Blood pressure continues to remain high restart amlodipine.  As needed IV hydralazine. 3. Hypothyroidism on Synthroid. 4. Diabetes mellitus type 2 on sliding scale coverage. 5. History of PE on Coumadin.  Pharmacy to dose. 6. History of large bowel stricture status post surgery and colostomy. 7. Chronic kidney disease stage III creatinine appears to be at baseline.  Note that patient is on ACE inhibitor.   DVT prophylaxis: Lovenox. Code Status: Full code. Family Communication: Patient's daughter. Disposition Plan: Home. Consults called: None. Admission status: Observation.   Rise Patience MD [facility] Pager [phone].  If 7PM-7AM, please contact night-coverage [email or web address] Password [email or web address]  10/31/2018, 2:26 AM

## 2018-10-31 NOTE — Progress Notes (Signed)
    Pt orientation to unit, room and routine. Information packet given to patient/family and safety video watched.  Admission INP armband ID verified with patient/family, and in place. SR up x 2, fall risk assessment complete with Patient and family verbalizing understanding of risks associated with falls. Pt verbalizes an understanding of how to use the call bell and to call for help before getting out of bed.  Skin, clean-dry- intact without evidence of bruising, or skin tears.   Patient has a colostomy bag, no skin breakdown noted.  Will cont to monitor and assist as needed.  Hannah [name] Shelda Pal, RN 10/31/2018 4:04 PM

## 2018-10-31 NOTE — Progress Notes (Signed)
ANTICOAGULATION CONSULT NOTE  Pharmacy Consult for Coumadin Indication: h/o PE  Patient Measurements: Height: 5\' 7"  (170.2 cm) Weight: 164 lb (74.4 kg) IBW/kg (Calculated) : 61.6  Vital Signs: Temp: 98.5 F (36.9 C) (10/28 1233) Temp Source: Oral (10/28 1233) BP: 155/99 (10/28 1233) Pulse Rate: 75 (10/28 1233)  Labs: Recent Labs    10/30/18 1824 10/30/18 1849 10/30/18 2144 10/31/18 1237  HGB 12.1  --   --   --   HCT 40.3  --   --   --   PLT 240  --   --   --   LABPROT  --  22.5*  --  21.5*  INR  --  2.0*  --  1.9*  CREATININE 1.19*  --   --   --   TROPONINIHS  --  6 8  --     Estimated Creatinine Clearance: 41.7 mL/min (A) (by C-G formula based on SCr of 1.19 mg/dL (H)).  Assessment: 78 y.o. female admitted with syncope. She continues on warfarin for history of PE. INR is slightly subtherapeutic today at 1.9. No bleeding noted.   Goal of Therapy:  INR 2-3 Monitor platelets by anticoagulation protocol: Yes   Plan:  Warfarin 5mg  PO x 1 tonight Daily INR  Salome Arnt, PharmD, BCPS Clinical Pharmacist Please see AMION for all pharmacy numbers 10/31/2018 1:48 PM

## 2018-10-31 NOTE — ED Notes (Signed)
Checked patient cbg it was 73 notified RN of blood sugar patient is now eating breakfast

## 2018-10-31 NOTE — ED Notes (Signed)
Report given to [facility].  

## 2018-10-31 NOTE — Procedures (Signed)
Patient Name: Hannah [name]  MRN: [id number]  Epilepsy Attending: Lora Havens  Referring Physician/Provider: Dr. Gean Birchwood Date: 10/31/2018 Duration: 33.05 minutes  Patient history: 78 year old female with syncopal episode.  EEG to evaluate for seizures.  Level of alertness: awake, asleep  AEDs during EEG study: None  Technical aspects: This EEG study was done with scalp electrodes positioned according to the 10-20 International system of electrode placement. Electrical activity was acquired at a sampling rate of 500Hz  and reviewed with a high frequency filter of 70Hz  and a low frequency filter of 1Hz . EEG data were recorded continuously and digitally stored.   Description: During awake state,the posterior dominant rhythm consists of 8 Hz activity of moderate voltage (25-35 uV) seen predominantly in posterior head regions, symmetric and reactive to eye opening and eye closing.  Sleep was characterized by intermittent generalized 5-6Hz  theta slowing.  Physiologic photic driving was seen during photic stimulation.  Hyperventilation was not performed.  IMPRESSION: This study is within normal limits. No seizures or epileptiform discharges were seen throughout the recording.  Hannah [name]

## 2018-10-31 NOTE — Progress Notes (Signed)
EEG complete - results pending 

## 2018-11-01 DIAGNOSIS — R55 Syncope and collapse: Secondary | ICD-10-CM | POA: Diagnosis not present

## 2018-11-01 LAB — GLUCOSE, CAPILLARY
Glucose-Capillary: 99 mg/dL (ref 70–99)
Glucose-Capillary: 99 mg/dL (ref 70–99)
Glucose-Capillary: 139 mg/dL — ABNORMAL HIGH (ref 70–99)
Glucose-Capillary: 91 mg/dL (ref 70–99)

## 2018-11-01 LAB — PROTIME-INR
INR: 2.1 — ABNORMAL HIGH (ref 0.8–1.2)
Prothrombin Time: 23.3 seconds — ABNORMAL HIGH (ref 11.4–15.2)

## 2018-11-01 MED ORDER — WARFARIN SODIUM 5 MG PO TABS
5.0000 mg | ORAL_TABLET | Freq: Once | ORAL | Status: AC
  Administered 2018-11-01: 5 mg via ORAL
  Filled 2018-11-01: qty 1

## 2018-11-01 NOTE — Progress Notes (Signed)
PROGRESS NOTE    Hannah [name]  PJA:[id number] DOB: 05/06/1940 DOA: 10/30/2018 PCP: Wynn Banker, MD   Brief Narrative:  Hannah [name] is a 78 y.o. female with history of pulmonary embolism on Coumadin, diabetes mellitus type 2, hypertension, large bowel obstruction which status post colectomy with colostomy was brought to the ER after patient had a witnessed syncopal episode.  Patient daughter states that last evening when patient was brought dinner at her home by the daughter before she could eat patient had a brief loss of consciousness while in the sitting position lasted for few seconds and regained consciousness.  Did not have any seizure-like episode or any incontinence of bowel or tongue bite.  Did have one episode of vomiting.  On October 18 10 days ago patient and family went to restroom and similar episode happened at the time patient was brought to the ER and was referred to the neurologist.  Neurology is planning to get an MRI of the brain and EEG as outpatient.  Which is still pending.  Patient did not have any chest pain or palpitation or shortness of breath.  Was recently started on amlodipine for uncontrolled blood pressure.  Patient had complained of some dizziness while taking amlodipine at stopped off-and-on for this reason but still continues to take.  EMS on arrival found that patient blood pressure is in the low 60s systolic was given 500 cc normal saline bolus following which blood pressure improved.  Blood sugar was more than 200.  ED Course: In the ER patient is alert awake oriented moving all extremities.  EKG shows normal sinus rhythm with QTC of 396 ms.  High-sensitivity troponin was 6 and 8.  Hemoglobin 12.1 platelets 240 creatinine at baseline of 1.1.  Patient admitted for further management of syncopal episode.  Per daughter this is her third syncopal episode since May.  Last 1 was 10 days ago and another 1 in May 2020.  Assessment & Plan:   Principal Problem:  Syncope Active Problems:   DM2 (diabetes mellitus, type 2) (HCC)   Hypothyroid   Essential hypertension   Pulmonary hypertension (HCC)   Chronic diastolic CHF (congestive heart failure) (HCC)   Large bowel stricture (HCC)  Syncope: Source unknown.  CT and MRI of the brain unremarkable.  Echo with normal ejection fraction, no wall motion abnormality or significant valvular defect.  She is doing much better today.  She is completely alert and oriented.  Does not have any focal deficit.  No event on telemetry.  Orthostatics were ordered yesterday but not performed.   Frequent falls: Per patient, she has fallen approximately 4 times in last 1 month.  Source unknown, could be overall declining health versus orthostatic hypotension.  Seen by PT OT and they recommend SNF.  Patient willing to go to SNF as well.  Social worker consulted.  Essential hypertension: Reportedly, she was slightly hypotensive upon presentation yesterday but then her blood pressure improved.  Her blood pressure is currently within normal limits.  Continue amlodipine.  Hypothyroidism: Continue Synthroid.  Type 2 diabetes mellitus: Controlled.  Continue SSI.  History of PE: On Coumadin and INR therapeutic.  Continue Coumadin per pharmacy.  History of large bowel stricture status post surgery and colostomy.  Noted.  CKD stage III: At baseline.  Continue to watch.  DVT prophylaxis: Lovenox Code Status: Full code Family Communication:  None present at bedside.  Plan of care discussed with patient in length and he verbalized understanding and agreed with it. Disposition  Plan: Discharge when placement arranged.  Estimated body mass index is 25.69 kg/m as calculated from the following:   Height as of this encounter: 5\' 7"  (1.702 m).   Weight as of this encounter: 74.4 kg.      Nutritional status:               Consultants:   None  Procedures:   None  Antimicrobials:   None   Subjective: Seen  and examined.  She has no complaints.  Completely alert and oriented.  Objective: Vitals:   10/31/18 1351 10/31/18 2024 10/31/18 2146 11/01/18 0614  BP: (!) 192/94 120/69 138/76 136/66  Pulse:  62 (!) 56 (!) 58  Resp:   16 16  Temp:   97.7 F (36.5 C) 97.8 F (36.6 C)  TempSrc:   Oral Oral  SpO2:   100% 100%  Weight:      Height:       No intake or output data in the 24 hours ending 11/01/18 1345 Filed Weights   10/30/18 1821  Weight: 74.4 kg    Examination:  General exam: Appears calm and comfortable  Respiratory system: Clear to auscultation. Respiratory effort normal. Cardiovascular system: S1 & S2 heard, RRR. No JVD, murmurs, rubs, gallops or clicks. No pedal edema. Gastrointestinal system: Abdomen is nondistended, soft and nontender. No organomegaly or masses felt. Normal bowel sounds heard. Central nervous system: Alert and oriented. No focal neurological deficits. Extremities: Symmetric 5 x 5 power. Skin: No rashes, lesions or ulcers.  Psychiatry: Judgement and insight appear normal. Mood & affect appropriate.    Data Reviewed: I have personally reviewed following labs and imaging studies  CBC: Recent Labs  Lab 10/30/18 1824  WBC 7.5  HGB 12.1  HCT 40.3  MCV 93.5  PLT 240   Basic Metabolic Panel: Recent Labs  Lab 10/30/18 1824 10/30/18 1849  NA 136  --   K 3.7  --   CL 105  --   CO2 25  --   GLUCOSE 263*  --   BUN 13  --   CREATININE 1.19*  --   CALCIUM 8.8*  --   MG  --  1.6*   GFR: Estimated Creatinine Clearance: 41.7 mL/min (A) (by C-G formula based on SCr of 1.19 mg/dL (H)). Liver Function Tests: No results for input(s): AST, ALT, ALKPHOS, BILITOT, PROT, ALBUMIN in the last 168 hours. No results for input(s): LIPASE, AMYLASE in the last 168 hours. No results for input(s): AMMONIA in the last 168 hours. Coagulation Profile: Recent Labs  Lab 10/30/18 1849 10/31/18 1237 11/01/18 0230  INR 2.0* 1.9* 2.1*   Cardiac Enzymes: No results  for input(s): CKTOTAL, CKMB, CKMBINDEX, TROPONINI in the last 168 hours. BNP (last 3 results) No results for input(s): PROBNP in the last 8760 hours. HbA1C: No results for input(s): HGBA1C in the last 72 hours. CBG: Recent Labs  Lab 10/31/18 1246 10/31/18 1632 10/31/18 2147 11/01/18 0748 11/01/18 1206  GLUCAP 100* 151* 121* 99 99   Lipid Profile: No results for input(s): CHOL, HDL, LDLCALC, TRIG, CHOLHDL, LDLDIRECT in the last 72 hours. Thyroid Function Tests: Recent Labs    10/30/18 1850  TSH 4.408   Anemia Panel: No results for input(s): VITAMINB12, FOLATE, FERRITIN, TIBC, IRON, RETICCTPCT in the last 72 hours. Sepsis Labs: No results for input(s): PROCALCITON, LATICACIDVEN in the last 168 hours.  Recent Results (from the past 240 hour(s))  SARS CORONAVIRUS 2 (TAT 6-24 HRS) Nasopharyngeal Nasopharyngeal Swab  Status: None   Collection Time: 10/31/18 12:02 AM   Specimen: Nasopharyngeal Swab  Result Value Ref Range Status   SARS Coronavirus 2 NEGATIVE NEGATIVE Final    Comment: (NOTE) SARS-CoV-2 target nucleic acids are NOT DETECTED. The SARS-CoV-2 RNA is generally detectable in upper and lower respiratory specimens during the acute phase of infection. Negative results do not preclude SARS-CoV-2 infection, do not rule out co-infections with other pathogens, and should not be used as the sole basis for treatment or other patient management decisions. Negative results must be combined with clinical observations, patient history, and epidemiological information. The expected result is Negative. Fact Sheet for Patients: SugarRoll.be Fact Sheet for Healthcare Providers: https://www.woods-mathews.com/ This test is not yet approved or cleared by the [place] FDA and  has been authorized for detection and/or diagnosis of SARS-CoV-2 by FDA under an Emergency Use Authorization (EUA). This EUA will remain  in effect (meaning this  test can be used) for the duration of the COVID-19 declaration under Section 56 4(b)(1) of the Act, 21 U.S.C. section 360bbb-3(b)(1), unless the authorization is terminated or revoked sooner. Performed at [facility], [place] [address], [place], [place] [address]       Radiology Studies: Dg Chest 2 View  Result Date: 10/30/2018 CLINICAL DATA:  Fatigue and syncope EXAM: CHEST - 2 VIEW COMPARISON:  December 15, 2017 FINDINGS: No edema or consolidation. Heart is upper normal in size with pulmonary vascularity normal. No adenopathy. There is aortic atherosclerosis. No evident bone lesions. IMPRESSION: No edema or consolidation. Heart upper normal in size. Aortic Atherosclerosis (ICD10-I70.0). Electronically Signed   By: Lowella Grip III M.D.   On: 10/30/2018 19:51   Mr Brain Wo Contrast  Result Date: 10/31/2018 CLINICAL DATA:  Syncope and nausea EXAM: MRI HEAD WITHOUT CONTRAST TECHNIQUE: Multiplanar, multiecho pulse sequences of the brain and surrounding structures were obtained without intravenous contrast. COMPARISON:  Head CT 05/21/2018 FINDINGS: Brain: No acute infarction, hemorrhage, hydrocephalus, extra-axial collection or mass lesion. Prominent atrophy with variable subarachnoid space enlargement greatest along the sylvian fissures and anterior interhemispheric fissure. No vessel distortion to imply discrete collection. Chronic small vessel ischemia in the cerebral white matter and pons, overall mild for age. Vascular: Normal flow voids Skull and upper cervical spine: Negative for marrow lesion Sinuses/Orbits: Negative IMPRESSION: 1. No acute or reversible finding. 2. Cerebral atrophy and chronic small vessel ischemia. Electronically Signed   By: Monte Fantasia M.D.   On: 10/31/2018 06:43    Scheduled Meds: . amLODipine  2.5 mg Oral QHS  . insulin aspart  0-9 Units Subcutaneous TID WC  . levothyroxine  25 mcg Oral Q0600  . lisinopril  40 mg Oral Daily  . metoprolol tartrate   25 mg Oral BID  . simvastatin  20 mg Oral QHS  . vitamin B-12  1,000 mcg Oral Daily  . vitamin C  500 mg Oral Daily  . warfarin  5 mg Oral ONCE-1800  . Warfarin - Pharmacist Dosing Inpatient   Does not apply q1800   Continuous Infusions:    LOS: 1 day   Time spent: 28 minutes   Darliss Cheney, MD [facility]  11/01/2018, 1:45 PM   To contact the attending provider between 7A-7P or the covering provider during after hours 7P-7A, please log into the web site [email or web address] and use password [email or web address].

## 2018-11-01 NOTE — Progress Notes (Signed)
ANTICOAGULATION CONSULT NOTE - Follow Up Consult  Pharmacy Consult for warfarin Indication: hx pulmonary embolus  Allergies  Allergen Reactions  . Augmentin [Amoxicillin-Pot Clavulanate] Itching and Other (See Comments)    Severe vaginal itching  . Tranxene [Clorazepate] Itching  . Penicillins Itching and Rash    Has patient had a PCN reaction causing immediate rash, facial/tongue/throat swelling, SOB or lightheadedness with hypotension: Yes Has patient had a PCN reaction causing severe rash involving mucus membranes or skin necrosis: No Has patient had a PCN reaction that required hospitalization: No Has patient had a PCN reaction occurring within the last 10 years: No If all of the above answers are "NO", then may proceed with Cephalosporin use.     Patient Measurements: Height: 5\' 7"  (170.2 cm) Weight: 164 lb (74.4 kg) IBW/kg (Calculated) : 61.6  Vital Signs: Temp: 97.8 F (36.6 C) (10/29 0614) Temp Source: Oral (10/29 0614) BP: 136/66 (10/29 0614) Pulse Rate: 58 (10/29 0614)  Labs: Recent Labs    10/30/18 1824 10/30/18 1849 10/30/18 2144 10/31/18 1237 11/01/18 0230  HGB 12.1  --   --   --   --   HCT 40.3  --   --   --   --   PLT 240  --   --   --   --   LABPROT  --  22.5*  --  21.5* 23.3*  INR  --  2.0*  --  1.9* 2.1*  CREATININE 1.19*  --   --   --   --   TROPONINIHS  --  6 8  --   --     Estimated Creatinine Clearance: 41.7 mL/min (A) (by C-G formula based on SCr of 1.19 mg/dL (H)).   Assessment: 78 y.o. W admitted with syncope. She continues on warfarin for history of PE. INR is in goal range at 2.1. No bleeding noted, no CBCs since admit. Patient with frequent falls and syncope episodes. Possible discharge today, will continue home dose.  PTA warfarin dose 5mg  TueThrSun, 2.5mg  all other days   Goal of Therapy:  INR 2-3 Monitor platelets by anticoagulation protocol: Yes   Plan:  Warfarin 5mg  PO x 1 tonight Daily INR  Benetta Spar, PharmD, BCPS,  [facility] Clinical Pharmacist  Please check AMION for all [place] phone numbers After 10:00 PM, call [place] [phone]

## 2018-11-01 NOTE — NC FL2 (Signed)
[place] LEVEL OF CARE SCREENING TOOL     IDENTIFICATION  Patient Name: Hannah [name] Birthdate: [birth year] Sex: female Admission Date (Current Location): 10/30/2018  [facility] and [place] Number:  Herbalist and Address:  [facility], [place] [address] [place], [place] [address]      Provider Number: [id number]  Attending Physician Name and Address:  Darliss Cheney, MD  Relative Name and Phone Number:  Aubery Lapping (daughter) [phone]    Current Level of Care: Hospital Recommended Level of Care: [place] Prior Approval Number:    Date Approved/Denied:   PASRR Number: [id number]  Discharge Plan: SNF    Current Diagnoses: Patient Active Problem List   Diagnosis Date Noted  . Syncope 10/30/2018  . Frequent falls 05/21/2018  . Encounter for therapeutic drug monitoring 05/07/2018  . Large bowel stricture (HCC) 12/15/2017  . Chronic diastolic CHF (congestive heart failure) ([place]) 03/15/2017  . Anticoagulated on warfarin   . Pulmonary hypertension ([place]) 07/26/2016  . Neuropathy 02/12/2015  . Osteoarthritis 08/12/2014  . History of pulmonary embolism 06/10/2013  . Hypothyroid 06/06/2013  . Essential hypertension 06/06/2013  . Diabetes mellitus with neurological manifestations, controlled ([place]) 06/06/2013  . DM2 (diabetes mellitus, type 2) ([place]) 07/23/2012    Orientation RESPIRATION BLADDER Height & Weight     Self, Time, Situation, Place  Normal Continent Weight: 164 lb (74.4 kg) Height:  5\' 7"  (170.2 cm)  BEHAVIORAL SYMPTOMS/MOOD NEUROLOGICAL BOWEL NUTRITION STATUS      Continent Diet(please see dc summary)  AMBULATORY STATUS COMMUNICATION OF NEEDS Skin   Limited Assist Verbally Normal                       Personal Care Assistance Level of Assistance              Functional Limitations Info             SPECIAL CARE FACTORS FREQUENCY  PT (By licensed PT), OT (By licensed OT)     PT Frequency: 5X OT Frequency: 5X            Contractures Contractures Info: Not present    Additional Factors Info  Allergies, Code Status, Insulin Sliding Scale Code Status Info: full code Allergies Info: Augmentin, Tranxene, Penicillins   Insulin Sliding Scale Info: see dc summary for dose       Current Medications (11/01/2018):  This is the current hospital active medication list Current Facility-Administered Medications  Medication Dose Route Frequency Provider Last Rate Last Dose  . acetaminophen (TYLENOL) tablet 650 mg  650 mg Oral Q6H PRN Rise Patience, MD   650 mg at 10/31/18 5176   Or  . acetaminophen (TYLENOL) suppository 650 mg  650 mg Rectal Q6H PRN Rise Patience, MD      . amLODipine (NORVASC) tablet 2.5 mg  2.5 mg Oral QHS Pahwani, Einar Grad, MD   2.5 mg at 10/31/18 1351  . furosemide (LASIX) tablet 20 mg  20 mg Oral Daily PRN Darliss Cheney, MD      . insulin aspart (novoLOG) injection 0-9 Units  0-9 Units Subcutaneous TID WC Rise Patience, MD   2 Units at 10/31/18 1719  . levothyroxine (SYNTHROID) tablet 25 mcg  25 mcg Oral Q0600 Rise Patience, MD   25 mcg at 11/01/18 0645  . lisinopril (ZESTRIL) tablet 40 mg  40 mg Oral Daily Rise Patience, MD   40 mg at 11/01/18 0950  .  metoprolol tartrate (LOPRESSOR) tablet 25 mg  25 mg Oral BID Eduard Clos, MD   25 mg at 11/01/18 0950  . ondansetron (ZOFRAN) tablet 4 mg  4 mg Oral Q6H PRN Eduard Clos, MD       Or  . ondansetron [facility]) injection 4 mg  4 mg Intravenous Q6H PRN Eduard Clos, MD      . simvastatin (ZOCOR) tablet 20 mg  20 mg Oral QHS Eduard Clos, MD   20 mg at 10/31/18 2027  . vitamin B-12 (CYANOCOBALAMIN) tablet 1,000 mcg  1,000 mcg Oral Daily Eduard Clos, MD   1,000 mcg at 11/01/18 0950  . vitamin C (ASCORBIC ACID) tablet 500 mg  500 mg Oral Daily Eduard Clos, MD   500 mg at 11/01/18 0950  . warfarin (COUMADIN) tablet 5 mg  5  mg Oral ONCE-1800 Leander Rams, [facility]      . Warfarin - Pharmacist Dosing Inpatient   Does not apply [id number] Eduard Clos, MD         Discharge Medications: Please see discharge summary for a list of discharge medications.  Relevant Imaging Results:  Relevant Lab Results:   Additional Information [id number]. COVID neg 10-31-2018  Ranelle Oyster, Student-Social Work

## 2018-11-01 NOTE — Evaluation (Signed)
Occupational Therapy Evaluation Patient Details Name: Hannah [name] MRN: [id number] DOB: January 18, 1940 Today's Date: 11/01/2018    History of Present Illness 78 yo female with onset of syncopal episode at home resulting in trip to hosp.  Has had repetitive syncopal events, and has been noted to have high BP as well recently.  MRI of brain is negative, Covid (-).  PMHx:  OA hips, atherosclerosis, colostomy, HTN, pulm HTN, hypothyroidism, CHF, PE, DM, MI, IVC filter placement, coumadin use   Clinical Impression   Pt admitted with the above diagnoses and presents with below problem list. Pt will benefit from continued acute OT to address the below listed deficits and maximize independence with basic ADLs prior to d/c to venue below. At baseline, pt is mod I with ADLs, uses rollator, lives alone. Pt reports family and friends able to assist intermittently but she is unsure that full 24 hour supervision/assist can be arranged. Pt is currently mostly min guard LB ADLs and functional mobility, min A with shower transfer and pericare in sit<>stand.      Follow Up Recommendations  SNF;Supervision/Assistance - 24 hour    Equipment Recommendations  Other (comment)(TBD next venue)    Recommendations for Other Services       Precautions / Restrictions Precautions Precautions: Fall Precaution Comments: h/o syncope. notes occasional R hand tremor.  Restrictions Weight Bearing Restrictions: No      Mobility Bed Mobility               General bed mobility comments: up in chair  Transfers Overall transfer level: Needs assistance Equipment used: Rolling walker (2 wheeled) Transfers: Sit to/from Stand Sit to Stand: Min guard         General transfer comment: min guard for safety    Balance Overall balance assessment: Needs assistance Sitting-balance support: Feet supported Sitting balance-Leahy Scale: Good     Standing balance support: Bilateral upper extremity supported;During  functional activity Standing balance-Leahy Scale: Poor Standing balance comment: rollator at baseline. able to stand with support of sink to wash hands                           ADL either performed or assessed with clinical judgement   ADL Overall ADL's : Needs assistance/impaired Eating/Feeding: Set up;Sitting Eating/Feeding Details (indicate cue type and reason): Pt reports using L hand for drinking hot beverages due to intermittent tremor. Discussed safety with utensils (ex using L hand to cut food). Grooming: Min guard;Standing   Upper Body Bathing: Set up;Sitting   Lower Body Bathing: Min guard;Sit to/from stand   Upper Body Dressing : Set up;Sitting   Lower Body Dressing: Min guard;Sit to/from stand   Toilet Transfer: Min guard;Ambulation;Comfort height toilet;RW;Grab bars   Toileting- Clothing Manipulation and Hygiene: Min guard;Sit to/from stand;Minimal assistance   Tub/ Shower Transfer: Min guard;Minimal assistance;Ambulation;3 in 1;Rolling walker   Functional mobility during ADLs: Min guard;Rolling walker General ADL Comments: Pt completed grooming task standing at sink then walked household distance. Visually appeared fatigued at end of session. (forward flexed posture)      Vision         Perception     Praxis      Pertinent Vitals/Pain Pain Assessment: No/denies pain     Hand Dominance Right   Extremity/Trunk Assessment Upper Extremity Assessment Upper Extremity Assessment: Generalized weakness;RUE deficits/detail RUE Deficits / Details: pt reports intermittent R hand tremor with onset "a couple of weeks ago. That's when my  daughter started having to help me write out checks."   Lower Extremity Assessment Lower Extremity Assessment: Defer to PT evaluation   Cervical / Trunk Assessment Cervical / Trunk Assessment: Normal   Communication Communication Communication: No difficulties   Cognition Arousal/Alertness: Awake/alert Behavior  During Therapy: WFL for tasks assessed/performed Overall Cognitive Status: No family/caregiver present to determine baseline cognitive functioning                                 General Comments: some slow processing noted. STM deficits.    General Comments       Exercises     Shoulder Instructions      Home Living Family/patient expects to be discharged to:: Private residence Living Arrangements: Alone Available Help at Discharge: Family;Available PRN/intermittently Type of Home: House Home Access: Stairs to enter [employer] of Steps: 5 Entrance Stairs-Rails: Right;Left;Can reach both Home Layout: One level     Bathroom Shower/Tub: Chief Strategy Officer: Standard     Home Equipment: Cane - single point;Walker - 4 wheels;Bedside commode;Shower seat   Additional Comments: life alert      Prior Functioning/Environment Level of Independence: Independent with assistive device(s)        Comments: was living with daughters recently        OT Problem List: Decreased strength;Decreased activity tolerance;Impaired balance (sitting and/or standing);Decreased cognition;Decreased safety awareness;Decreased knowledge of use of DME or AE;Decreased knowledge of precautions      OT Treatment/Interventions: Self-care/ADL training;Therapeutic exercise;Energy conservation;DME and/or AE instruction;Therapeutic activities;Patient/family education;Balance training;Cognitive remediation/compensation    OT Goals(Current goals can be found in the care plan section) Acute Rehab OT Goals Patient Stated Goal: rehab then home. OT Goal Formulation: With patient Time For Goal Achievement: 11/15/18 Potential to Achieve Goals: Good ADL Goals Pt Will Perform Lower Body Bathing: with modified independence;sit to/from stand Pt Will Perform Lower Body Dressing: with modified independence;sit to/from stand Pt Will Transfer to Toilet: with modified  independence;ambulating Pt Will Perform Toileting - Clothing Manipulation and hygiene: with modified independence;sit to/from stand Pt Will Perform Tub/Shower Transfer: with supervision;ambulating;3 in 1;rolling walker;shower seat  OT Frequency: Min 2X/week   Barriers to D/C:            Co-evaluation              AM-PAC OT "6 Clicks" Daily Activity     Outcome Measure Help from another person eating meals?: None Help from another person taking care of personal grooming?: A Little Help from another person toileting, which includes using toliet, bedpan, or urinal?: A Little Help from another person bathing (including washing, rinsing, drying)?: A Little Help from another person to put on and taking off regular upper body clothing?: None Help from another person to put on and taking off regular lower body clothing?: A Little 6 Click Score: 20   End of Session Equipment Utilized During Treatment: Gait belt;Rolling walker  Activity Tolerance: Patient tolerated treatment well Patient left: in chair;with call bell/phone within reach;with chair alarm set  OT Visit Diagnosis: Unsteadiness on feet (R26.81);Muscle weakness (generalized) (M62.81);Other symptoms and signs involving cognitive function                Time: 0947-0962 OT Time Calculation (min): 30 min Charges:  OT General Charges $OT Visit: 1 Visit OT Evaluation $OT Eval Low Complexity: 1 Low OT Treatments $Self Care/Home Management : 8-22 mins  Raynald Kemp, OT Acute  Rehabilitation Services Pager: [phone] Office: [place], [place] 11/01/2018, 10:51 AM

## 2018-11-01 NOTE — TOC Initial Note (Signed)
Transition of Care [facility]) - Initial/Assessment Note    Patient Details  Name: Hannah [name] MRN: [id number] Date of Birth: 03-01-1940  Transition of Care [facility]) CM/SW Contact:    Ralph Dowdy, [place] Work Phone Number: 11/01/2018, 12:33 PM  Clinical Narrative:                 CSW spoke with pt about SNF and home health options. Patient stated that she would like to attend a SNF. Pt reported to be living alone and had her two daughters and son as supports who live in [place], one daughter is her neighbor. CSW gave pt SNF agency list with survey summary ratings. Pt was very gracious and appeared to be happy. CSW will make referrals to SNF and will continue to follow up.       Patient Goals and CMS Choice        Expected Discharge Plan and Services                                                Prior Living Arrangements/Services                       Activities of Daily Living Home Assistive Devices/Equipment: Gilford Rile (specify type) ADL Screening (condition at time of admission) Patient's cognitive ability adequate to safely complete daily activities?: Yes Is the patient deaf or have difficulty hearing?: No Does the patient have difficulty seeing, even when wearing glasses/contacts?: No Does the patient have difficulty concentrating, remembering, or making decisions?: No Patient able to express need for assistance with ADLs?: Yes Does the patient have difficulty dressing or bathing?: No Independently performs ADLs?: Yes (appropriate for developmental age) Does the patient have difficulty walking or climbing stairs?: Yes Weakness of Legs: Both Weakness of Arms/Hands: None  Permission Sought/Granted                  Emotional Assessment              Admission diagnosis:  Hypotension, unspecified hypotension type [I95.9] Syncope, unspecified syncope type [R55] Fatigue, unspecified type [R53.83] Syncope [R55] Patient Active Problem  List   Diagnosis Date Noted  . Syncope 10/30/2018  . Frequent falls 05/21/2018  . Encounter for therapeutic drug monitoring 05/07/2018  . Large bowel stricture (HCC) 12/15/2017  . Chronic diastolic CHF (congestive heart failure) ([place]) 03/15/2017  . Anticoagulated on warfarin   . Pulmonary hypertension ([place]) 07/26/2016  . Neuropathy 02/12/2015  . Osteoarthritis 08/12/2014  . History of pulmonary embolism 06/10/2013  . Hypothyroid 06/06/2013  . Essential hypertension 06/06/2013  . Diabetes mellitus with neurological manifestations, controlled ([place]) 06/06/2013  . DM2 (diabetes mellitus, type 2) ([place]) 07/23/2012   PCP:  [name], MD Pharmacy:   [place] ([place] - [address] [place] ([place]) [place] [address] Phone: [phone] Fax: [phone]     Social Determinants of Health (SDOH) Interventions    Readmission Risk Interventions No flowsheet data found.

## 2018-11-02 DIAGNOSIS — R278 Other lack of coordination: Secondary | ICD-10-CM | POA: Diagnosis not present

## 2018-11-02 DIAGNOSIS — Z86711 Personal history of pulmonary embolism: Secondary | ICD-10-CM | POA: Diagnosis not present

## 2018-11-02 DIAGNOSIS — Z7901 Long term (current) use of anticoagulants: Secondary | ICD-10-CM | POA: Diagnosis not present

## 2018-11-02 DIAGNOSIS — I5032 Chronic diastolic (congestive) heart failure: Secondary | ICD-10-CM | POA: Diagnosis not present

## 2018-11-02 DIAGNOSIS — I110 Hypertensive heart disease with heart failure: Secondary | ICD-10-CM | POA: Diagnosis not present

## 2018-11-02 DIAGNOSIS — E860 Dehydration: Secondary | ICD-10-CM | POA: Diagnosis present

## 2018-11-02 DIAGNOSIS — Z5181 Encounter for therapeutic drug level monitoring: Secondary | ICD-10-CM | POA: Diagnosis not present

## 2018-11-02 DIAGNOSIS — M2550 Pain in unspecified joint: Secondary | ICD-10-CM | POA: Diagnosis not present

## 2018-11-02 DIAGNOSIS — M6281 Muscle weakness (generalized): Secondary | ICD-10-CM | POA: Diagnosis not present

## 2018-11-02 DIAGNOSIS — R262 Difficulty in walking, not elsewhere classified: Secondary | ICD-10-CM | POA: Diagnosis not present

## 2018-11-02 DIAGNOSIS — E039 Hypothyroidism, unspecified: Secondary | ICD-10-CM | POA: Diagnosis not present

## 2018-11-02 DIAGNOSIS — R5381 Other malaise: Secondary | ICD-10-CM | POA: Diagnosis not present

## 2018-11-02 DIAGNOSIS — R55 Syncope and collapse: Secondary | ICD-10-CM | POA: Diagnosis not present

## 2018-11-02 DIAGNOSIS — Z933 Colostomy status: Secondary | ICD-10-CM | POA: Diagnosis not present

## 2018-11-02 DIAGNOSIS — I4891 Unspecified atrial fibrillation: Secondary | ICD-10-CM | POA: Diagnosis not present

## 2018-11-02 DIAGNOSIS — Z954 Presence of other heart-valve replacement: Secondary | ICD-10-CM | POA: Diagnosis not present

## 2018-11-02 DIAGNOSIS — Z7401 Bed confinement status: Secondary | ICD-10-CM | POA: Diagnosis not present

## 2018-11-02 DIAGNOSIS — I951 Orthostatic hypotension: Secondary | ICD-10-CM | POA: Diagnosis not present

## 2018-11-02 DIAGNOSIS — E118 Type 2 diabetes mellitus with unspecified complications: Secondary | ICD-10-CM | POA: Diagnosis not present

## 2018-11-02 DIAGNOSIS — M1990 Unspecified osteoarthritis, unspecified site: Secondary | ICD-10-CM | POA: Diagnosis not present

## 2018-11-02 DIAGNOSIS — Z743 Need for continuous supervision: Secondary | ICD-10-CM | POA: Diagnosis not present

## 2018-11-02 LAB — CBC WITH DIFFERENTIAL/PLATELET
Abs Immature Granulocytes: 0.05 10*3/uL (ref 0.00–0.07)
Basophils Absolute: 0 10*3/uL (ref 0.0–0.1)
Basophils Relative: 1 %
Eosinophils Absolute: 0.3 10*3/uL (ref 0.0–0.5)
Eosinophils Relative: 3 %
HCT: 39 % (ref 36.0–46.0)
Hemoglobin: 12.3 g/dL (ref 12.0–15.0)
Immature Granulocytes: 1 %
Lymphocytes Relative: 39 %
Lymphs Abs: 3.2 10*3/uL (ref 0.7–4.0)
MCH: 28.7 pg (ref 26.0–34.0)
MCHC: 31.5 g/dL (ref 30.0–36.0)
MCV: 91.1 fL (ref 80.0–100.0)
Monocytes Absolute: 0.7 10*3/uL (ref 0.1–1.0)
Monocytes Relative: 8 %
Neutro Abs: 4 10*3/uL (ref 1.7–7.7)
Neutrophils Relative %: 48 %
Platelets: 173 10*3/uL (ref 150–400)
RBC: 4.28 MIL/uL (ref 3.87–5.11)
RDW: 15.2 % (ref 11.5–15.5)
WBC: 8.3 10*3/uL (ref 4.0–10.5)
nRBC: 0 % (ref 0.0–0.2)

## 2018-11-02 LAB — BASIC METABOLIC PANEL
Anion gap: 9 (ref 5–15)
BUN: 28 mg/dL — ABNORMAL HIGH (ref 8–23)
CO2: 25 mmol/L (ref 22–32)
Calcium: 8.9 mg/dL (ref 8.9–10.3)
Chloride: 106 mmol/L (ref 98–111)
Creatinine, Ser: 1.29 mg/dL — ABNORMAL HIGH (ref 0.44–1.00)
GFR calc Af Amer: 46 mL/min — ABNORMAL LOW (ref >60)
GFR calc non Af Amer: 40 mL/min — ABNORMAL LOW (ref >60)
Glucose, Bld: 86 mg/dL (ref 70–99)
Potassium: 5 mmol/L (ref 3.5–5.1)
Sodium: 140 mmol/L (ref 135–145)

## 2018-11-02 LAB — GLUCOSE, CAPILLARY
Glucose-Capillary: 87 mg/dL (ref 70–99)
Glucose-Capillary: 129 mg/dL — ABNORMAL HIGH (ref 70–99)

## 2018-11-02 LAB — PROTIME-INR
INR: 2.2 — ABNORMAL HIGH (ref 0.8–1.2)
Prothrombin Time: 23.8 seconds — ABNORMAL HIGH (ref 11.4–15.2)

## 2018-11-02 LAB — MAGNESIUM: Magnesium: 2.1 mg/dL (ref 1.7–2.4)

## 2018-11-02 MED ORDER — WARFARIN SODIUM 2.5 MG PO TABS
2.5000 mg | ORAL_TABLET | ORAL | Status: DC
  Filled 2018-11-02: qty 1

## 2018-11-02 MED ORDER — WARFARIN SODIUM 5 MG PO TABS: 5.0000 mg | ORAL_TABLET | ORAL | Status: DC

## 2018-11-02 NOTE — Progress Notes (Signed)
   11/01/18 2155  MEWS Score  Pulse Rate 61  BP (!) 123/51  Temp 97.6 F (36.4 C)  SpO2 100 %  O2 Device Room Air  MEWS Score  MEWS RR 0  MEWS Pulse 0  MEWS Systolic 0  MEWS LOC 0  MEWS Temp 0  MEWS Score 0  MEWS Score Color Green  MEWS Assessment  Is this an acute change? No  MEWS Guidelines - (patients age 78 and over)  Red - At High Risk for Deterioration Yellow - At risk for Deterioration  1. Go to room and assess patient 2. Validate data. Is this patient's baseline? If data confirmed: 3. Is this an acute change? 4. Administer prn meds/treatments as ordered. 5. Note Sepsis score 6. Review goals of care 7. Sports coach, RRT nurse and Provider. 8. Ask Provider to come to bedside.  9. Document patient condition/interventions/response. 10. Increase frequency of vital signs and focused assessments to at least q15 minutes x 4, then q30 minutes x2. - If stable, then q1h x3, then q4h x3 and then q8h or dept. routine. - If unstable, contact Provider & RRT nurse. Prepare for possible transfer. 11. Add entry in progress notes using the smart phrase ".MEWS". 1. Go to room and assess patient 2. Validate data. Is this patient's baseline? If data confirmed: 3. Is this an acute change? 4. Administer prn meds/treatments as ordered? 5. Note Sepsis score 6. Review goals of care 7. Sports coach and Provider 8. Call RRT nurse as needed. 9. Document patient condition/interventions/response. 10. Increase frequency of vital signs and focused assessments to at least q2h x2. - If stable, then q4h x2 and then q8h or dept. routine. - If unstable, contact Provider & RRT nurse. Prepare for possible transfer. 11. Add entry in progress notes using the smart phrase ".MEWS".  Green - Likely stable [place] - Comfort Care Only  1. Continue routine/ordered monitoring.  2. Review goals of care. 1. Continue routine/ordered monitoring. 2. Review goals of care.

## 2018-11-02 NOTE — Discharge Instructions (Signed)
Dehydration, Adult  Dehydration is when there is not enough fluid or water in your body. This happens when you lose more fluids than you take in. Dehydration can range from mild to very bad. It should be treated right away to keep it from getting very bad. Symptoms of mild dehydration may include:  Thirst.  Dry lips.  Slightly dry mouth.  Dry, warm skin.  Dizziness. Symptoms of moderate dehydration may include:  Very dry mouth.  Muscle cramps.  Dark pee (urine). Pee may be the color of tea.  Your body making less pee.  Your eyes making fewer tears.  Heartbeat that is uneven or faster than normal (palpitations).  Headache.  Light-headedness, especially when you stand up from sitting.  Fainting (syncope). Symptoms of very bad dehydration may include:  Changes in skin, such as: ? Cold and clammy skin. ? Blotchy (mottled) or pale skin. ? Skin that does not quickly return to normal after being lightly pinched and let go (poor skin turgor).  Changes in body fluids, such as: ? Feeling very thirsty. ? Your eyes making fewer tears. ? Not sweating when body temperature is high, such as in hot weather. ? Your body making very little pee.  Changes in vital signs, such as: ? Weak pulse. ? Pulse that is more than 100 beats a minute when you are sitting still. ? Fast breathing. ? Low blood pressure.  Other changes, such as: ? Sunken eyes. ? Cold hands and feet. ? Confusion. ? Lack of energy (lethargy). ? Trouble waking up from sleep. ? Short-term weight loss. ? Unconsciousness. Follow these instructions at home:   If told by your doctor, drink an ORS: ? Make an ORS by using instructions on the package. ? Start by drinking small amounts, about  cup (120 mL) every 5-10 minutes. ? Slowly drink more until you have had the amount that your doctor said to have.  Drink enough clear fluid to keep your pee clear or pale yellow. If you were told to drink an ORS, finish the  ORS first, then start slowly drinking clear fluids. Drink fluids such as: ? Water. Do not drink only water by itself. Doing that can make the salt (sodium) level in your body get too low (hyponatremia). ? Ice chips. ? Fruit juice that you have added water to (diluted). ? Low-calorie sports drinks.  Avoid: ? Alcohol. ? Drinks that have a lot of sugar. These include high-calorie sports drinks, fruit juice that does not have water added, and soda. ? Caffeine. ? Foods that are greasy or have a lot of fat or sugar.  Take over-the-counter and prescription medicines only as told by your doctor.  Do not take salt tablets. Doing that can make the salt level in your body get too high (hypernatremia).  Eat foods that have minerals (electrolytes). Examples include bananas, oranges, potatoes, tomatoes, and spinach.  Keep all follow-up visits as told by your doctor. This is important. Contact a doctor if:  You have belly (abdominal) pain that: ? Gets worse. ? Stays in one area (localizes).  You have a rash.  You have a stiff neck.  You get angry or annoyed more easily than normal (irritability).  You are more sleepy than normal.  You have a harder time waking up than normal.  You feel: ? Weak. ? Dizzy. ? Very thirsty.  You have peed (urinated) only a small amount of very dark pee during 6-8 hours. Get help right away if:  You have   symptoms of very bad dehydration.  You cannot drink fluids without throwing up (vomiting).  Your symptoms get worse with treatment.  You have a fever.  You have a very bad headache.  You are throwing up or having watery poop (diarrhea) and it: ? Gets worse. ? Does not go away.  You have blood or something green (bile) in your throw-up.  You have blood in your poop (stool). This may cause poop to look black and tarry.  You have not peed in 6-8 hours.  You pass out (faint).  Your heart rate when you are sitting still is more than 100 beats a  minute.  You have trouble breathing. This information is not intended to replace advice given to you by your health care provider. Make sure you discuss any questions you have with your health care provider. Document Released: 10/16/2008 Document Revised: 12/02/2016 Document Reviewed: 02/13/2015 Elsevier Patient Education  2020 Elsevier Inc.  

## 2018-11-02 NOTE — TOC Progression Note (Signed)
Transition of Care [facility]) - Progression Note    Patient Details  Name: Hannah [name] MRN: [id number] Date of Birth: Oct 04, 1940  Transition of Care [facility]) CM/SW [place], [place] Work Phone Number: 11/02/2018, 2:57 PM  Clinical Narrative:    Insurance approval received for [facility]: [id number]. Md aware.    Expected Discharge Plan: [place] Barriers to Discharge: No Barriers Identified  Expected Discharge Plan and Services Expected Discharge Plan: [place]         Expected Discharge Date: 11/02/18                                     Social Determinants of Health (SDOH) Interventions    Readmission Risk Interventions No flowsheet data found.

## 2018-11-02 NOTE — Progress Notes (Signed)
Physical Therapy Treatment Patient Details Name: Hannah [name] MRN: [id number] DOB: October 25, 1940 Today's Date: 11/02/2018    History of Present Illness 78 yo female with onset of syncopal episode at home resulting in trip to [place].  Has had repetitive syncopal events, and has been noted to have high BP as well recently.  MRI of brain is negative, Covid (-).  PMHx:  OA hips, atherosclerosis, colostomy, HTN, pulm HTN, hypothyroidism, CHF, PE, DM, MI, IVC filter placement, coumadin use    PT Comments    Patient received in recliner, agrees to PT. Patient pleasant, requires supervision for transfer from recliner and min guard for ambulation with RW 200 feet. Required min assist when rising from low commode. Patient will continue to benefit from skilled PT to improve strength and functional independence.     Follow Up Recommendations  Home health PT     Equipment Recommendations  Rolling walker with 5" wheels    Recommendations for Other Services       Precautions / Restrictions Precautions Precautions: Fall Restrictions Weight Bearing Restrictions: No    Mobility  Bed Mobility               General bed mobility comments: up in recliner  Transfers Overall transfer level: Needs assistance Equipment used: None Transfers: Sit to/from Stand Sit to Stand: Min assist         General transfer comment: supervision from recliner, min assist from low commode  Ambulation/Gait Ambulation/Gait assistance: Min guard Gait Distance (Feet): 200 Feet Assistive device: Rolling walker (2 wheeled) Gait Pattern/deviations: Step-through pattern;Decreased stride length Gait velocity: reduced   General Gait Details: pt drifts to the L often coming close to obstacles and needs frequent assist or reminders   Stairs             Wheelchair Mobility    Modified Rankin (Stroke Patients Only)       Balance Overall balance assessment: Needs assistance Sitting-balance support:  Feet supported Sitting balance-Leahy Scale: Good     Standing balance support: Bilateral upper extremity supported;During functional activity Standing balance-Leahy Scale: Good Standing balance comment: able to stand at sink to wash hands with no UE support                            Cognition Arousal/Alertness: Awake/alert Behavior During Therapy: WFL for tasks assessed/performed Overall Cognitive Status: Within Functional Limits for tasks assessed                                        Exercises      General Comments        Pertinent Vitals/Pain Pain Assessment: No/denies pain    Home Living                      Prior Function            PT Goals (current goals can now be found in the care plan section) Acute Rehab PT Goals Patient Stated Goal: rehab then home. PT Goal Formulation: With patient Time For Goal Achievement: 11/14/18 Potential to Achieve Goals: Good Progress towards PT goals: Progressing toward goals    Frequency    Min 3X/week      PT Plan Current plan remains appropriate    Co-evaluation  AM-PAC PT "6 Clicks" Mobility   Outcome Measure  Help needed turning from your back to your side while in a flat bed without using bedrails?: None Help needed moving from lying on your back to sitting on the side of a flat bed without using bedrails?: A Little Help needed moving to and from a bed to a chair (including a wheelchair)?: A Little Help needed standing up from a chair using your arms (e.g., wheelchair or bedside chair)?: A Little Help needed to walk in hospital room?: A Little Help needed climbing 3-5 steps with a railing? : A Little 6 Click Score: 19    End of Session Equipment Utilized During Treatment: Gait belt Activity Tolerance: Patient tolerated treatment well Patient left: in chair;with chair alarm set;with call bell/phone within reach Nurse Communication: Mobility status PT  Visit Diagnosis: Unsteadiness on feet (R26.81);Muscle weakness (generalized) (M62.81)     Time: 1194-1740 PT Time Calculation (min) (ACUTE ONLY): 19 min  Charges:  $Gait Training: 8-22 mins                     Levent Kornegay, PT, GCS 11/02/18,1:13 PM

## 2018-11-02 NOTE — TOC Progression Note (Signed)
Transition of Care [facility]) - Progression Note    Patient Details  Name: Hannah [name] MRN: [id number] Date of Birth: December 29, 1940  Transition of Care [facility]) CM/SW [place], [place] Work Phone Number: 11/02/2018, 10:22 AM  Clinical Narrative:    CSW presented bed offers to pt. Pt selected [facility]. They have a bed available for the pt today pending insurance approval. CSW updated insurance with the facility choice and Josem Kaufmann is still pending.     Expected Discharge Plan: Skilled Nursing Facility Barriers to Discharge: Ship broker, SNF Pending bed offer  Expected Discharge Plan and Services Expected Discharge Plan: [place]                                               Social Determinants of Health (SDOH) Interventions    Readmission Risk Interventions No flowsheet data found.

## 2018-11-02 NOTE — TOC Transition Note (Signed)
Transition of Care [facility]) - CM/SW Discharge Note   Patient Details  Name: Hannah [name] MRN: [id number] Date of Birth: [birth year]  Transition of Care [facility]) CM/SW Contact:  Ralph Dowdy, [place] Work Phone Number: 11/02/2018, 3:05 PM   Clinical Narrative:    DC to: [facility] [place] DC date: 11/02/2018 Transportation: [place] summary sent to facility. RN to call report to [phone] room 113  Final next level of care: Skilled Nursing Facility Barriers to Discharge: No Barriers Identified   Patient Goals and CMS Choice Patient states their goals for this hospitalization and ongoing recovery are:: To get healthy CMS Medicare.gov Compare Post Acute Care list provided to:: Patient Choice offered to / list presented to : Patient  Discharge Placement   Existing PASRR number confirmed : 11/02/18          Patient chooses bed at: [facility] Patient to be transferred to facility by: [place] Name of family member notified: patient notifying family Patient and family notified of of transfer: 11/02/18  Discharge Plan and Services In-house Referral: NA Discharge Planning Services: CM Consult Post Acute Care Choice: [place]          DME Arranged: N/A DME Agency: NA       HH Arranged: NA HH Agency: NA        Social Determinants of Health (SDOH) Interventions     Readmission Risk Interventions No flowsheet data found.

## 2018-11-02 NOTE — Discharge Summary (Signed)
Physician Discharge Summary  Hannah [name] SVX:[id number] DOB: 02/10/1940 DOA: 10/30/2018  PCP: [name], MD  Admit date: 10/30/2018 Discharge date: 11/02/2018  Admitted From: Home Disposition: Skilled nursing facility  Recommendations for Outpatient Follow-up:  1. Follow up with PCP in 1-2 weeks 2. Please obtain BMP/CBC in one week 3. Please follow up on the following pending results:  Home Health: None Equipment/Devices: None  Discharge Condition: Stable CODE STATUS: Full code Diet recommendation: Cardiac  Subjective: Seen and examined.  Feels good.  No complaints.  Brief/Interim Summary: Hannah [name] a 78 y.o.femalewithhistory of pulmonary embolism on Coumadin, diabetes mellitus type 2, hypertension, large bowel obstruction which status post colectomy with colostomy was brought to the ER after patient had a witnessed syncopal episode. Patient daughter states that evening before admission when patient was brought dinner at her home by the daughter before she could eat patient had a brief loss of consciousness while in the sitting position lasted for few seconds and regained consciousness. Did not have any seizure-like episode or any incontinence of bowel or tongue bite. Did have one episode of vomiting.  She had similar episode about 10 days ago as well and at the time patient was brought to the ER and was referred to the neurologist. Neurology was planning to get an MRI of the brain and EEG as outpatient which was still pending at the time of admission.  Patient did not have any other complaint. Was recently started on amlodipine for uncontrolled blood pressure. Patient had complained of some dizziness while taking amlodipine at stopped off-and-on for this reason but still continues to take. EMS on arrival found that patient blood pressure is in the low 23R systolic was given 007 cc normal saline bolus following which blood pressure improved. Blood sugar was more than  200.  Upon arrival to ER, she was awake and oriented.EKG showed normal sinus rhythm with QTC of 396 ms. High-sensitivity troponin was 6 and 8. Hemoglobin 12.1 platelets 240 creatinine at baseline of 1.1. Patient admitted for further management of syncopal episode. Per daughter this is her third syncopal episode since May.  CT head and MRI of the brain were obtained which were unremarkable.  Transthoracic echo ruled out any valvular heart defect.  Upon further questioning, she revealed that she drinks barely 16 ounces of water a day.  She looks dry clinically as well.  The source of her syncope is most likely orthostatic hypotension which is also supported by the fact that she was hypotensive when EMS arrived at her home.  I have extensively counseled her to drink at least 64 ounces of water a day.  Patient's blood pressure remained stable.  She was evaluated by PT OT during this hospitalization and recommended SNF which has been arranged for her so she is going to be discharged today in stable condition.  Discharge Diagnoses:  Principal Problem:   Syncope Active Problems:   DM2 (diabetes mellitus, type 2) (HCC)   Hypothyroid   Essential hypertension   Pulmonary hypertension (HCC)   Chronic diastolic CHF (congestive heart failure) (HCC)   Large bowel stricture [facility])   Dehydration    Discharge Instructions  Discharge Instructions    Discharge patient   Complete by: As directed    Discharge disposition: 03-Skilled [place]   Discharge patient date: 11/02/2018     Allergies as of 11/02/2018      Reactions   Augmentin [amoxicillin-pot Clavulanate] Itching, Other (See Comments)   Severe vaginal itching   Tranxene [  clorazepate] Itching   Penicillins Itching, Rash   Has patient had a PCN reaction causing immediate rash, facial/tongue/throat swelling, SOB or lightheadedness with hypotension: Yes Has patient had a PCN reaction causing severe rash involving mucus membranes or skin  necrosis: No Has patient had a PCN reaction that required hospitalization: No Has patient had a PCN reaction occurring within the last 10 years: No If all of the above answers are "NO", then may proceed with Cephalosporin use.      Medication List    TAKE these medications   acetaminophen 500 MG tablet Commonly known as: TYLENOL Take 500 mg by mouth every 6 (six) hours as needed for mild pain.   amLODipine 2.5 MG tablet Commonly known as: NORVASC Take 1 tablet (2.5 mg total) by mouth at bedtime.   furosemide 20 MG tablet Commonly known as: LASIX Take 1 tablet (20 mg total) by mouth daily as needed for fluid or edema.   glucose blood test strip Commonly known as: Civil engineer, contracting Use as instructed once a day   levothyroxine 25 MCG tablet Commonly known as: SYNTHROID TAKE 1 TABLET BY MOUTH ONCE DAILY BEFORE BREAKFAST   lisinopril 40 MG tablet Commonly known as: ZESTRIL Take 1 tablet (40 mg total) by mouth daily.   metFORMIN 1000 MG tablet Commonly known as: GLUCOPHAGE TAKE ONE TABLET BY MOUTH WITH BREAKFAST AND TAKE ONE TABLET WITH DINNER   metoprolol tartrate 25 MG tablet Commonly known as: LOPRESSOR Take 1 tablet (25 mg total) by mouth 2 (two) times daily.   OneTouch Delica Lancets 16X Misc USE 1  TO CHECK GLUCOSE ONCE DAILY   OneTouch Verio w/Device Kit 1 each by Does not apply route as directed.   polyethylene glycol 17 g packet Commonly known as: MIRALAX / GLYCOLAX Take 17 g by mouth daily. What changed:   when to take this  reasons to take this   simvastatin 20 MG tablet Commonly known as: ZOCOR Take 1 tablet (20 mg total) by mouth at bedtime.   VITAMIN B-12 PO Take 1 tablet by mouth daily.   vitamin C 500 MG tablet Commonly known as: ASCORBIC ACID Take 500 mg by mouth daily.   warfarin 5 MG tablet Commonly known as: COUMADIN Take as directed. If you are unsure how to take this medication, talk to your nurse or doctor. Original instructions:  Take 1/2 or 1 tablet daily as directed by Coumadin clinic What changed:   how much to take  how to take this  when to take this  additional instructions       Contact information for follow-up providers    Koberlein, Junell C, MD Follow up in 1 week(s).   Specialty: Family Medicine Contact information: [place] [address] [phone]        Fay Records, MD .   Specialty: Cardiology Contact information: [address]  [place] [address] [phone]            Contact information for after-discharge care    Destination    [facility] .   Service: Skilled Nursing Contact information: [place] [address] [phone]                 Allergies  Allergen Reactions  . Augmentin [Amoxicillin-Pot Clavulanate] Itching and Other (See Comments)    Severe vaginal itching  . Tranxene [Clorazepate] Itching  . Penicillins Itching and Rash    Has patient had a  PCN reaction causing immediate rash, facial/tongue/throat swelling, SOB or lightheadedness with hypotension: Yes Has patient had a PCN reaction causing severe rash involving mucus membranes or skin necrosis: No Has patient had a PCN reaction that required hospitalization: No Has patient had a PCN reaction occurring within the last 10 years: No If all of the above answers are "NO", then may proceed with Cephalosporin use.     Consultations: None   Procedures/Studies: Dg Chest 2 View  Result Date: 10/30/2018 CLINICAL DATA:  Fatigue and syncope EXAM: CHEST - 2 VIEW COMPARISON:  December 15, 2017 FINDINGS: No edema or consolidation. Heart is upper normal in size with pulmonary vascularity normal. No adenopathy. There is aortic atherosclerosis. No evident bone lesions. IMPRESSION: No edema or consolidation. Heart upper normal in size. Aortic Atherosclerosis (ICD10-I70.0). Electronically Signed   By: Lowella Grip III M.D.   On: 10/30/2018 19:51   Dg Shoulder Right  Result Date: 10/21/2018 CLINICAL DATA:  78 year old female with right shoulder injury. EXAM: RIGHT SHOULDER - 2+ VIEW COMPARISON:  None. FINDINGS: There is no acute fracture or dislocation. The bones are osteopenic. Mild-to-moderate arthritic changes of the right shoulder with bone spurring. No fusion. The soft tissues are unremarkable. IMPRESSION: 1. No acute fracture or dislocation. 2. Mild to moderate arthritic changes of the right shoulder. Electronically Signed   By: Anner Crete M.D.   On: 10/21/2018 20:44   Ct Head Wo Contrast  Result Date: 10/21/2018 CLINICAL DATA:  78 year old female with altered mental status EXAM: CT HEAD WITHOUT CONTRAST TECHNIQUE: Contiguous axial images were obtained from the base of the skull through the vertex without intravenous contrast. COMPARISON:  05/21/2018 and prior CTs FINDINGS: Brain: No evidence of acute infarction, hemorrhage, hydrocephalus, extra-axial collection or mass lesion/mass effect. Chronic small-vessel white matter ischemic changes and atrophy again noted. Vascular: Carotid atherosclerotic calcifications noted Skull: Normal. Negative for fracture or focal lesion. Sinuses/Orbits: No acute finding. Other: None. IMPRESSION: 1. No evidence of acute intracranial abnormality. 2. Atrophy and chronic small-vessel white matter ischemic changes. Electronically Signed   By: Margarette Canada M.D.   On: 10/21/2018 21:02   Mr Brain Wo Contrast  Result Date: 10/31/2018 CLINICAL DATA:  Syncope and nausea EXAM: MRI HEAD WITHOUT CONTRAST TECHNIQUE: Multiplanar, multiecho pulse sequences of the brain and surrounding structures were obtained without intravenous contrast. COMPARISON:  Head CT 05/21/2018 FINDINGS: Brain: No acute infarction, hemorrhage, hydrocephalus, extra-axial collection or mass lesion. Prominent atrophy with variable subarachnoid space enlargement greatest along the sylvian fissures and  anterior interhemispheric fissure. No vessel distortion to imply discrete collection. Chronic small vessel ischemia in the cerebral white matter and pons, overall mild for age. Vascular: Normal flow voids Skull and upper cervical spine: Negative for marrow lesion Sinuses/Orbits: Negative IMPRESSION: 1. No acute or reversible finding. 2. Cerebral atrophy and chronic small vessel ischemia. Electronically Signed   By: Monte Fantasia M.D.   On: 10/31/2018 06:43      Discharge Exam: Vitals:   11/02/18 0948 11/02/18 1248  BP: 121/64 134/75  Pulse: 78 65  Resp: 18 20  Temp:  97.7 F (36.5 C)  SpO2: 98% 100%   Vitals:   11/02/18 0832 11/02/18 0834 11/02/18 0948 11/02/18 1248  BP: 128/79 134/72 121/64 134/75  Pulse: 92 71 78 65  Resp: '20 19 18 20  ' Temp: 97.6 F (36.4 C) 98.3 F (36.8 C)  97.7 F (36.5 C)  TempSrc: Oral Oral  Oral  SpO2: 100% 100% 98% 100%  Weight:  Height:        General: Pt is alert, awake, not in acute distress Cardiovascular: RRR, S1/S2 +, no rubs, no gallops Respiratory: CTA bilaterally, no wheezing, no rhonchi Abdominal: Soft, NT, ND, bowel sounds + Extremities: no edema, no cyanosis    The results of significant diagnostics from this hospitalization (including imaging, microbiology, ancillary and laboratory) are listed below for reference.     Microbiology: Recent Results (from the past 240 hour(s))  SARS CORONAVIRUS 2 (TAT 6-24 HRS) Nasopharyngeal Nasopharyngeal Swab     Status: None   Collection Time: 10/31/18 12:02 AM   Specimen: Nasopharyngeal Swab  Result Value Ref Range Status   SARS Coronavirus 2 NEGATIVE NEGATIVE Final    Comment: (NOTE) SARS-CoV-2 target nucleic acids are NOT DETECTED. The SARS-CoV-2 RNA is generally detectable in upper and lower respiratory specimens during the acute phase of infection. Negative results do not preclude SARS-CoV-2 infection, do not rule out co-infections with other pathogens, and should not be used as  the sole basis for treatment or other patient management decisions. Negative results must be combined with clinical observations, patient history, and epidemiological information. The expected result is Negative. Fact Sheet for Patients: SugarRoll.be Fact Sheet for Healthcare Providers: https://www.woods-mathews.com/ This test is not yet approved or cleared by the [place] FDA and  has been authorized for detection and/or diagnosis of SARS-CoV-2 by FDA under an Emergency Use Authorization (EUA). This EUA will remain  in effect (meaning this test can be used) for the duration of the COVID-19 declaration under Section 56 4(b)(1) of the Act, 21 U.S.C. section 360bbb-3(b)(1), unless the authorization is terminated or revoked sooner. Performed at [facility], [place] [address], [place], [place] [address]      Labs: BNP (last 3 results) Recent Labs    10/30/18 1850  BNP 604.5*   Basic Metabolic Panel: Recent Labs  Lab 10/30/18 1824 10/30/18 1849 11/02/18 0754  NA 136  --  140  K 3.7  --  5.0  CL 105  --  106  CO2 25  --  25  GLUCOSE 263*  --  86  BUN 13  --  28*  CREATININE 1.19*  --  1.29*  CALCIUM 8.8*  --  8.9  MG  --  1.6* 2.1   Liver Function Tests: No results for input(s): AST, ALT, ALKPHOS, BILITOT, PROT, ALBUMIN in the last 168 hours. No results for input(s): LIPASE, AMYLASE in the last 168 hours. No results for input(s): AMMONIA in the last 168 hours. CBC: Recent Labs  Lab 10/30/18 1824 11/02/18 0754  WBC 7.5 8.3  NEUTROABS  --  4.0  HGB 12.1 12.3  HCT 40.3 39.0  MCV 93.5 91.1  PLT 240 173   Cardiac Enzymes: No results for input(s): CKTOTAL, CKMB, CKMBINDEX, TROPONINI in the last 168 hours. BNP: Invalid input(s): POCBNP CBG: Recent Labs  Lab 11/01/18 1206 11/01/18 1656 11/01/18 2206 11/02/18 0827 11/02/18 1248  GLUCAP 99 139* 91 87 129*   D-Dimer No results for input(s): DDIMER in the last  72 hours. Hgb A1c No results for input(s): HGBA1C in the last 72 hours. Lipid Profile No results for input(s): CHOL, HDL, LDLCALC, TRIG, CHOLHDL, LDLDIRECT in the last 72 hours. Thyroid function studies Recent Labs    10/30/18 1850  TSH 4.408   Anemia work up No results for input(s): VITAMINB12, FOLATE, FERRITIN, TIBC, IRON, RETICCTPCT in the last 72 hours. Urinalysis    Component Value Date/Time   COLORURINE YELLOW 05/21/2018  [place] (A) 05/21/2018 0635   LABSPEC 1.020 05/21/2018 0635   PHURINE 5.0 05/21/2018 0635   GLUCOSEU NEGATIVE 05/21/2018 0635   HGBUR NEGATIVE 05/21/2018 0635   BILIRUBINUR NEGATIVE 05/21/2018 [place] 05/21/2018 0635   PROTEINUR NEGATIVE 05/21/2018 0635   UROBILINOGEN 0.2 03/14/2017 1102   NITRITE NEGATIVE 05/21/2018 0635   LEUKOCYTESUR NEGATIVE 05/21/2018 0635   Sepsis Labs Invalid input(s): PROCALCITONIN,  WBC,  LACTICIDVEN Microbiology Recent Results (from the past 240 hour(s))  SARS CORONAVIRUS 2 (TAT 6-24 HRS) Nasopharyngeal Nasopharyngeal Swab     Status: None   Collection Time: 10/31/18 12:02 AM   Specimen: Nasopharyngeal Swab  Result Value Ref Range Status   SARS Coronavirus 2 NEGATIVE NEGATIVE Final    Comment: (NOTE) SARS-CoV-2 target nucleic acids are NOT DETECTED. The SARS-CoV-2 RNA is generally detectable in upper and lower respiratory specimens during the acute phase of infection. Negative results do not preclude SARS-CoV-2 infection, do not rule out co-infections with other pathogens, and should not be used as the sole basis for treatment or other patient management decisions. Negative results must be combined with clinical observations, patient history, and epidemiological information. The expected result is Negative. Fact Sheet for Patients: SugarRoll.be Fact Sheet for Healthcare Providers: https://www.woods-mathews.com/ This test is not yet approved  or cleared by the [place] FDA and  has been authorized for detection and/or diagnosis of SARS-CoV-2 by FDA under an Emergency Use Authorization (EUA). This EUA will remain  in effect (meaning this test can be used) for the duration of the COVID-19 declaration under Section 56 4(b)(1) of the Act, 21 U.S.C. section 360bbb-3(b)(1), unless the authorization is terminated or revoked sooner. Performed at [facility], [place] [address], [place], [place] [address]      Time coordinating discharge: Over 30 minutes  SIGNED:   Darliss Cheney, MD  [facility] 11/02/2018, 2:44 PM  If 7PM-7AM, please contact night-coverage [email or web address] Password [email or web address]

## 2018-11-02 NOTE — Care Management Important Message (Signed)
Important Message  Patient Details  Name: Hannah [name] MRN: [id number] Date of Birth: [birth year]   Medicare Important Message Given:  Yes     Travious Vanover 11/02/2018, 1:54 PM

## 2018-11-02 NOTE — Progress Notes (Signed)
ANTICOAGULATION CONSULT NOTE - Follow Up Consult  Pharmacy Consult for warfarin Indication: hx pulmonary embolus   Patient Measurements: Height: 5\' 7"  (170.2 cm) Weight: 164 lb (74.4 kg) IBW/kg (Calculated) : 61.6  Vital Signs: Temp: 98.3 F (36.8 C) (10/30 0834) Temp Source: Oral (10/30 0834) BP: 121/64 (10/30 0948) Pulse Rate: 78 (10/30 0948)  Labs: Recent Labs    10/30/18 1824  10/30/18 1849 10/30/18 2144 10/31/18 1237 11/01/18 0230 11/02/18 0223 11/02/18 0754  HGB 12.1  --   --   --   --   --   --  12.3  HCT 40.3  --   --   --   --   --   --  39.0  PLT 240  --   --   --   --   --   --  173  LABPROT  --    < > 22.5*  --  21.5* 23.3* 23.8*  --   INR  --    < > 2.0*  --  1.9* 2.1* 2.2*  --   CREATININE 1.19*  --   --   --   --   --   --  1.29*  TROPONINIHS  --   --  6 8  --   --   --   --    < > = values in this interval not displayed.    Estimated Creatinine Clearance: 38.5 mL/min (A) (by C-G formula based on SCr of 1.29 mg/dL (H)).  Assessment: 78 y.o.Wadmitted with syncope. She continues on warfarin for history of PE. INR is 2.2 at goal. No bleeding noted, H/H stable. Patient with frequent falls and syncope episodes. Eating well, no new drug interactions. Planning discharge to SNF when arranged, will continue home dose.   PTA warfarin dose 5mg  TueThrSun, 2.5mg  all other days   Goal of Therapy: INR 2-3 Monitor platelets by anticoagulation protocol: Yes  Plan: Continue warfarin 5mg  TueThrSun, 2.5mg  MWFSat INR tomorrow, consider decreasing frequency if continues to be at goal   Benetta Spar, PharmD, BCPS, [place] Pharmacist  Please check AMION for all [place] phone numbers After 10:00 PM, call [place]

## 2018-11-02 NOTE — Plan of Care (Signed)
  Problem: Education: Goal: Knowledge of General Education information will improve Description Including pain rating scale, medication(s)/side effects and non-pharmacologic comfort measures Outcome: Progressing   

## 2018-11-02 NOTE — Progress Notes (Signed)
[name] to be D/C'd Skilled nursing facility [facility] by Corey Harold  per MD order.  Discussed with the patient and all questions fully answered.  VSS, Skin clean, dry and intact without evidence of skin break down, no evidence of skin tears noted. IV catheter discontinued intact. Site without signs and symptoms of complications. Dressing and pressure applied.  An After Visit Summary was printed and given to the patient. Patient received prescription.  D/c education completed with patient/family including follow up instructions, medication list, d/c activities limitations if indicated, with other d/c instructions as indicated by MD - patient able to verbalize understanding, all questions fully answered.   Patient instructed to return to ED, call 911, or call MD for any changes in condition.   Patient escorted via [facility] , and D/C [facility]   .  Report given to the Nurse [place] .                                                                     [place] 11/02/2018 5:36 PM

## 2018-11-05 ENCOUNTER — Telehealth: Payer: Self-pay | Admitting: *Deleted

## 2018-11-05 DIAGNOSIS — R262 Difficulty in walking, not elsewhere classified: Secondary | ICD-10-CM | POA: Diagnosis not present

## 2018-11-05 DIAGNOSIS — R5381 Other malaise: Secondary | ICD-10-CM | POA: Diagnosis not present

## 2018-11-05 DIAGNOSIS — M6281 Muscle weakness (generalized): Secondary | ICD-10-CM | POA: Diagnosis not present

## 2018-11-05 DIAGNOSIS — R55 Syncope and collapse: Secondary | ICD-10-CM | POA: Diagnosis not present

## 2018-11-05 NOTE — Telephone Encounter (Signed)
TCM not done d/t patient being discharged to SNF

## 2018-11-08 DIAGNOSIS — R55 Syncope and collapse: Secondary | ICD-10-CM | POA: Diagnosis not present

## 2018-11-08 DIAGNOSIS — I110 Hypertensive heart disease with heart failure: Secondary | ICD-10-CM | POA: Diagnosis not present

## 2018-11-08 DIAGNOSIS — I5032 Chronic diastolic (congestive) heart failure: Secondary | ICD-10-CM | POA: Diagnosis not present

## 2018-11-08 DIAGNOSIS — Z86711 Personal history of pulmonary embolism: Secondary | ICD-10-CM | POA: Diagnosis not present

## 2018-11-09 DIAGNOSIS — Z86711 Personal history of pulmonary embolism: Secondary | ICD-10-CM | POA: Diagnosis not present

## 2018-11-09 DIAGNOSIS — Z5181 Encounter for therapeutic drug level monitoring: Secondary | ICD-10-CM | POA: Diagnosis not present

## 2018-11-09 DIAGNOSIS — Z7901 Long term (current) use of anticoagulants: Secondary | ICD-10-CM | POA: Diagnosis not present

## 2018-11-12 DIAGNOSIS — R55 Syncope and collapse: Secondary | ICD-10-CM | POA: Diagnosis not present

## 2018-11-12 DIAGNOSIS — M6281 Muscle weakness (generalized): Secondary | ICD-10-CM | POA: Diagnosis not present

## 2018-11-12 DIAGNOSIS — R262 Difficulty in walking, not elsewhere classified: Secondary | ICD-10-CM | POA: Diagnosis not present

## 2018-11-12 DIAGNOSIS — M1990 Unspecified osteoarthritis, unspecified site: Secondary | ICD-10-CM | POA: Diagnosis not present

## 2018-11-14 ENCOUNTER — Other Ambulatory Visit: Payer: Medicare Other

## 2018-11-15 ENCOUNTER — Encounter: Payer: Self-pay | Admitting: Family Medicine

## 2018-11-15 ENCOUNTER — Ambulatory Visit: Payer: Self-pay | Admitting: *Deleted

## 2018-11-15 ENCOUNTER — Other Ambulatory Visit: Payer: Self-pay

## 2018-11-15 ENCOUNTER — Telehealth (INDEPENDENT_AMBULATORY_CARE_PROVIDER_SITE_OTHER): Payer: Medicare Other | Admitting: Family Medicine

## 2018-11-15 DIAGNOSIS — I5032 Chronic diastolic (congestive) heart failure: Secondary | ICD-10-CM | POA: Diagnosis not present

## 2018-11-15 DIAGNOSIS — I10 Essential (primary) hypertension: Secondary | ICD-10-CM

## 2018-11-15 DIAGNOSIS — R55 Syncope and collapse: Secondary | ICD-10-CM | POA: Diagnosis not present

## 2018-11-15 DIAGNOSIS — E119 Type 2 diabetes mellitus without complications: Secondary | ICD-10-CM | POA: Diagnosis not present

## 2018-11-15 DIAGNOSIS — Z7984 Long term (current) use of oral hypoglycemic drugs: Secondary | ICD-10-CM | POA: Diagnosis not present

## 2018-11-15 DIAGNOSIS — Z8679 Personal history of other diseases of the circulatory system: Secondary | ICD-10-CM

## 2018-11-15 DIAGNOSIS — E1142 Type 2 diabetes mellitus with diabetic polyneuropathy: Secondary | ICD-10-CM

## 2018-11-15 DIAGNOSIS — Z7901 Long term (current) use of anticoagulants: Secondary | ICD-10-CM | POA: Diagnosis not present

## 2018-11-15 DIAGNOSIS — E039 Hypothyroidism, unspecified: Secondary | ICD-10-CM | POA: Diagnosis not present

## 2018-11-15 DIAGNOSIS — I110 Hypertensive heart disease with heart failure: Secondary | ICD-10-CM | POA: Diagnosis not present

## 2018-11-15 DIAGNOSIS — Z9181 History of falling: Secondary | ICD-10-CM | POA: Diagnosis not present

## 2018-11-15 DIAGNOSIS — Z86711 Personal history of pulmonary embolism: Secondary | ICD-10-CM | POA: Diagnosis not present

## 2018-11-15 DIAGNOSIS — E785 Hyperlipidemia, unspecified: Secondary | ICD-10-CM | POA: Diagnosis not present

## 2018-11-15 DIAGNOSIS — I272 Pulmonary hypertension, unspecified: Secondary | ICD-10-CM | POA: Diagnosis not present

## 2018-11-15 NOTE — Telephone Encounter (Signed)
Merry Proud from [facility] called to report the pt's BP is 198/100 11/15/2018 at 1305 on right upper arm; her other readings were over 180s/90's (198/96 at 1245 on left upper arm, 175/95 right arm ); she denies headache, chest pain, or shortness of breath; she has not missed doses of medications; she came home from [facility] on 11/14/2018; Merry Proud would also like recommendations for today's PT session; recommendations mae per nurse triage protocol; spoke with [facility], and she requests that this be sent to the office high priority for scheduling; will route per request; Merry Proud transferred to [facility] for orders related PT; they verbalize understanding.   Reason for Disposition . Systolic BP  >= 024 OR Diastolic >= 097  Answer Assessment - Initial Assessment Questions 1. BLOOD PRESSURE: "What is the blood pressure?" "Did you take at least two measurements 5 minutes apart?"   198/100 right upper arm at 1305; 198/96 11/15/2018 at 1245 left upper arm, and 175/95 right upper arm 2. ONSET: "When did you take your blood pressure?"    See above 3. HOW: "How did you obtain the blood pressure?" (e.g., visiting nurse, automatic home BP monitor)     Cuff on left and right upper arms 4. HISTORY: "Do you have a history of high blood pressure?"     yes 5. MEDICATIONS: "Are you taking any medications for blood pressure?" "Have you missed any doses recently?"   no 6. OTHER SYMPTOMS: "Do you have any symptoms?" (e.g., headache, chest pain, blurred vision, difficulty breathing, weakness)    no 7. PREGNANCY: "Is there any chance you are pregnant?" "When was your last menstrual period?"    no  Protocols used: HIGH BLOOD PRESSURE-A-AH

## 2018-11-15 NOTE — Progress Notes (Signed)
Virtual Visit via Video Note  I connected with Hannah [name]  on 11/15/18 at  3:00 PM EST by a video enabled telemedicine application and verified that I am speaking with the correct person using two identifiers.  Location patient: home Location provider:work or home office Persons participating in the virtual visit: patient, provider  I discussed the limitations of evaluation and management by telemedicine and the availability of in person appointments. The patient expressed understanding and agreed to proceed.   HPI:  Acute visit for Elevated BP: -history of HTN, CHF, DM, Syncope -she had recent hospitalization for low blood pressure and syncope, however prior to that she was struggling with elevated blood pressures with BPs nearly this high at prior visit -physical therapist checked her BP today and it was running high in the 180-190s/90s -patient reports feels fine -denies CP, SOB, dizziness, vision changes -174/95 currently -daughter thinks it may be up from eating [place] [employer] today and having lots of sodium -She took her BP medications around 7am (metoprolol, lisinopril), take norvasc at night  ROS: See pertinent positives and negatives per HPI.  Past Medical History:  Diagnosis Date  . Acute massive pulmonary embolism ([place]) 07/13/2012   Massive PE w/ PEA arrest 07/13/12 >TNK >IVC filter >discharged on comadin    . Diabetes mellitus, type 2 ([place]) 07/15/2012   with peripheral neuropathy  . Hyperlipemia 07/14/2012  . Hypertension 07/14/2012  . Large bowel stricture (HCC)    s/p colectomy in 2016  . Osteoarthritis    s/p hip and knee replacements  . Syncope 10/2018  . Thyroid disease     Past Surgical History:  Procedure Laterality Date  . COLONOSCOPY N/A 03/12/2016   Procedure: COLONOSCOPY;  Surgeon: Teena Irani, MD;  Location: [facility] ENDOSCOPY;  Service: Endoscopy;  Laterality: N/A;  . COLOSTOMY N/A 06/03/2014   Procedure: COLOSTOMY;  Surgeon: Erroll Luna, MD;  Location: [facility];   Service: General;  Laterality: N/A;  . FLEXIBLE SIGMOIDOSCOPY N/A 05/30/2014   Procedure: Beryle Quant;  Surgeon: Carol Ada, MD;  Location: [facility] ENDOSCOPY;  Service: Endoscopy;  Laterality: N/A;  . INSERTION OF VENA CAVA FILTER N/A 07/16/2012   Procedure: INSERTION OF VENA CAVA FILTER;  Surgeon: Elam Dutch, MD;  Location[facility] CATH LAB;  Service: Cardiovascular;  Laterality: N/A;  . KNEE ARTHROSCOPY    . PARTIAL COLECTOMY N/A 06/03/2014   Procedure: PARTIAL COLECTOMY;  Surgeon: Erroll Luna, MD;  Location: [place];  Service: General;  Laterality: N/A;  . REDUCTION MAMMAPLASTY Bilateral   . SP ARTHRO HIP*L*      Family History  Problem Relation Age of Onset  . Breast cancer Mother     SOCIAL HX: see hpi   Current Outpatient Medications:  .  acetaminophen (TYLENOL) 500 MG tablet, Take 500 mg by mouth every 6 (six) hours as needed for mild pain. , Disp: , Rfl:  .  amLODipine (NORVASC) 2.5 MG tablet, Take 1 tablet (2.5 mg total) by mouth at bedtime., Disp: 30 tablet, Rfl: 2 .  Blood Glucose Monitoring Suppl (ONETOUCH VERIO) w/Device KIT, 1 each by Does not apply route as directed., Disp: 1 kit, Rfl: 0 .  Cyanocobalamin (VITAMIN B-12 PO), Take 1 tablet by mouth daily., Disp: , Rfl:  .  furosemide (LASIX) 20 MG tablet, Take 1 tablet (20 mg total) by mouth daily as needed for fluid or edema., Disp: 30 tablet, Rfl: 3 .  glucose blood (ONETOUCH VERIO) test strip, Use as instructed once a day, Disp: 100 each, Rfl:  5 .  levothyroxine (SYNTHROID) 25 MCG tablet, TAKE 1 TABLET BY MOUTH ONCE DAILY BEFORE BREAKFAST, Disp: 90 tablet, Rfl: 1 .  lisinopril (ZESTRIL) 40 MG tablet, Take 1 tablet (40 mg total) by mouth daily., Disp: 90 tablet, Rfl: 1 .  metFORMIN (GLUCOPHAGE) 1000 MG tablet, TAKE ONE TABLET BY MOUTH WITH BREAKFAST AND TAKE ONE TABLET WITH DINNER, Disp: 180 tablet, Rfl: 1 .  metoprolol tartrate (LOPRESSOR) 25 MG tablet, Take 1 tablet (25 mg total) by mouth 2 (two) times daily.,  Disp: 180 tablet, Rfl: 1 .  OneTouch Delica Lancets 84Z MISC, USE 1  TO CHECK GLUCOSE ONCE DAILY, Disp: 100 each, Rfl: 3 .  polyethylene glycol (MIRALAX / GLYCOLAX) packet, Take 17 g by mouth daily. (Patient taking differently: Take 17 g by mouth daily as needed for moderate constipation. ), Disp: 14 each, Rfl: 0 .  simvastatin (ZOCOR) 20 MG tablet, Take 1 tablet (20 mg total) by mouth at bedtime., Disp: 90 tablet, Rfl: 1 .  vitamin C (ASCORBIC ACID) 500 MG tablet, Take 500 mg by mouth daily. , Disp: , Rfl:  .  warfarin (COUMADIN) 5 MG tablet, Take 1/2 or 1 tablet daily as directed by Coumadin clinic (Patient taking differently: Take 2.5-5 mg by mouth See admin instructions. Take 1/2 or 1 tablet daily as directed by Coumadin clinic; 5 mg (5 mg x 1) every Sun, Tue, Thu; 2.5 mg (5 mg x 0.5) all other days), Disp: 30 tablet, Rfl: 2  EXAM:  VITALS per patient if applicable:see hpi  GENERAL: alert, oriented, appears well and in no acute distress  HEENT: atraumatic, conjunttiva clear, no obvious abnormalities on inspection of external nose and ears  NECK: normal movements of the head and neck  LUNGS: on inspection no signs of respiratory distress, breathing rate appears normal, no obvious gross SOB, gasping or wheezing  CV: no obvious cyanosis  MS: moves all visible extremities without noticeable abnormality  PSYCH/NEURO: pleasant and cooperative, no obvious depression or anxiety, speech and thought processing grossly intact  ASSESSMENT AND PLAN:  Discussed the following assessment and plan:  Essential hypertension  Type 2 diabetes mellitus with diabetic polyneuropathy, without long-term current use of insulin (HCC)  History of hypotension  -Spoke with patient and her daughter. They have a cuff and can monitor her BP at home. She currently feels well and is without any complaints. We discussed possible serious and likely etiologies, options for evaluation and workup, limitations of  telemedicine visit vs in person visit, treatment, treatment risks and precautions. Pt prefers to treat via telemedicine empirically rather then risking or undertaking an in person visit at this moment. For now opted to try taking her norvasc dose early today and may need to change this to AM or noon dosing depending on BP values on home checks/monitoring. Also, they agreed to call the cardiologist after this visit as they need hospital follow up and will get their recs as well. Advised close monitoring of BP and to call or seek prompt medical care if running over 160/90 or low under 110/70 in the interim or if any symptoms. She has follow up with PCP later this month.    I discussed the assessment and treatment plan with the patient. The patient was provided an opportunity to ask questions and all were answered. The patient agreed with the plan and demonstrated an understanding of the instructions.   The patient was advised to call back or seek an in-person evaluation if the symptoms worsen or  if the condition fails to improve as anticipated.   Lucretia Kern, DO

## 2018-11-15 NOTE — Telephone Encounter (Signed)
Appt today at 3pm.

## 2018-11-16 ENCOUNTER — Other Ambulatory Visit: Payer: Self-pay

## 2018-11-16 ENCOUNTER — Ambulatory Visit (INDEPENDENT_AMBULATORY_CARE_PROVIDER_SITE_OTHER): Payer: Medicare Other | Admitting: *Deleted

## 2018-11-16 DIAGNOSIS — Z7901 Long term (current) use of anticoagulants: Secondary | ICD-10-CM | POA: Diagnosis not present

## 2018-11-16 DIAGNOSIS — Z5181 Encounter for therapeutic drug level monitoring: Secondary | ICD-10-CM

## 2018-11-16 DIAGNOSIS — I2699 Other pulmonary embolism without acute cor pulmonale: Secondary | ICD-10-CM | POA: Diagnosis not present

## 2018-11-16 LAB — POCT INR: INR: 1.2 — AB (ref 2.0–3.0)

## 2018-11-16 NOTE — Patient Instructions (Signed)
Description   Take 5mg  today and tomorrow, then continue to take 3mg  daily except for 5mg  on Sunday, Tuesday and Thursdays.  Recheck inr in 1 week. Call with any new meds or procedures [phone].

## 2018-11-16 NOTE — Progress Notes (Signed)
Clarified with pt's daughter Selinda Eon that pt's coumadin packets stated that she had 3 mg and 5 mg tablet strengths.

## 2018-11-20 ENCOUNTER — Telehealth: Payer: Self-pay | Admitting: Family Medicine

## 2018-11-20 DIAGNOSIS — Z7984 Long term (current) use of oral hypoglycemic drugs: Secondary | ICD-10-CM | POA: Diagnosis not present

## 2018-11-20 DIAGNOSIS — I5032 Chronic diastolic (congestive) heart failure: Secondary | ICD-10-CM | POA: Diagnosis not present

## 2018-11-20 DIAGNOSIS — I272 Pulmonary hypertension, unspecified: Secondary | ICD-10-CM | POA: Diagnosis not present

## 2018-11-20 DIAGNOSIS — E785 Hyperlipidemia, unspecified: Secondary | ICD-10-CM | POA: Diagnosis not present

## 2018-11-20 DIAGNOSIS — Z86711 Personal history of pulmonary embolism: Secondary | ICD-10-CM | POA: Diagnosis not present

## 2018-11-20 DIAGNOSIS — Z9181 History of falling: Secondary | ICD-10-CM | POA: Diagnosis not present

## 2018-11-20 DIAGNOSIS — Z7901 Long term (current) use of anticoagulants: Secondary | ICD-10-CM | POA: Diagnosis not present

## 2018-11-20 DIAGNOSIS — I110 Hypertensive heart disease with heart failure: Secondary | ICD-10-CM | POA: Diagnosis not present

## 2018-11-20 DIAGNOSIS — R55 Syncope and collapse: Secondary | ICD-10-CM | POA: Diagnosis not present

## 2018-11-20 DIAGNOSIS — E039 Hypothyroidism, unspecified: Secondary | ICD-10-CM | POA: Diagnosis not present

## 2018-11-20 DIAGNOSIS — E119 Type 2 diabetes mellitus without complications: Secondary | ICD-10-CM | POA: Diagnosis not present

## 2018-11-20 NOTE — Telephone Encounter (Signed)
ok 

## 2018-11-20 NOTE — Telephone Encounter (Signed)
Home Health Verbal Orders - Caller/Agency: [facility] Number: [phone] Requesting OT/PT/Skilled Nursing/Social Work/Speech Therapy: OT Frequency: 2 weeks x 4, 1 week x 1

## 2018-11-20 NOTE — Telephone Encounter (Signed)
Spoke with Hannah [name] and informed him of the approval for verbal orders as below.

## 2018-11-21 DIAGNOSIS — I272 Pulmonary hypertension, unspecified: Secondary | ICD-10-CM | POA: Diagnosis not present

## 2018-11-21 DIAGNOSIS — Z86711 Personal history of pulmonary embolism: Secondary | ICD-10-CM | POA: Diagnosis not present

## 2018-11-21 DIAGNOSIS — I5032 Chronic diastolic (congestive) heart failure: Secondary | ICD-10-CM | POA: Diagnosis not present

## 2018-11-21 DIAGNOSIS — E119 Type 2 diabetes mellitus without complications: Secondary | ICD-10-CM | POA: Diagnosis not present

## 2018-11-21 DIAGNOSIS — E039 Hypothyroidism, unspecified: Secondary | ICD-10-CM | POA: Diagnosis not present

## 2018-11-21 DIAGNOSIS — Z7984 Long term (current) use of oral hypoglycemic drugs: Secondary | ICD-10-CM | POA: Diagnosis not present

## 2018-11-21 DIAGNOSIS — I110 Hypertensive heart disease with heart failure: Secondary | ICD-10-CM | POA: Diagnosis not present

## 2018-11-21 DIAGNOSIS — Z7901 Long term (current) use of anticoagulants: Secondary | ICD-10-CM | POA: Diagnosis not present

## 2018-11-21 DIAGNOSIS — Z9181 History of falling: Secondary | ICD-10-CM | POA: Diagnosis not present

## 2018-11-21 DIAGNOSIS — E785 Hyperlipidemia, unspecified: Secondary | ICD-10-CM | POA: Diagnosis not present

## 2018-11-21 DIAGNOSIS — R55 Syncope and collapse: Secondary | ICD-10-CM | POA: Diagnosis not present

## 2018-11-22 DIAGNOSIS — Z9181 History of falling: Secondary | ICD-10-CM | POA: Diagnosis not present

## 2018-11-22 DIAGNOSIS — R55 Syncope and collapse: Secondary | ICD-10-CM | POA: Diagnosis not present

## 2018-11-22 DIAGNOSIS — E785 Hyperlipidemia, unspecified: Secondary | ICD-10-CM | POA: Diagnosis not present

## 2018-11-22 DIAGNOSIS — E119 Type 2 diabetes mellitus without complications: Secondary | ICD-10-CM | POA: Diagnosis not present

## 2018-11-22 DIAGNOSIS — I5032 Chronic diastolic (congestive) heart failure: Secondary | ICD-10-CM | POA: Diagnosis not present

## 2018-11-22 DIAGNOSIS — I110 Hypertensive heart disease with heart failure: Secondary | ICD-10-CM | POA: Diagnosis not present

## 2018-11-22 DIAGNOSIS — I272 Pulmonary hypertension, unspecified: Secondary | ICD-10-CM | POA: Diagnosis not present

## 2018-11-22 DIAGNOSIS — Z86711 Personal history of pulmonary embolism: Secondary | ICD-10-CM | POA: Diagnosis not present

## 2018-11-22 DIAGNOSIS — E039 Hypothyroidism, unspecified: Secondary | ICD-10-CM | POA: Diagnosis not present

## 2018-11-22 DIAGNOSIS — Z7984 Long term (current) use of oral hypoglycemic drugs: Secondary | ICD-10-CM | POA: Diagnosis not present

## 2018-11-22 DIAGNOSIS — Z7901 Long term (current) use of anticoagulants: Secondary | ICD-10-CM | POA: Diagnosis not present

## 2018-11-23 ENCOUNTER — Ambulatory Visit (INDEPENDENT_AMBULATORY_CARE_PROVIDER_SITE_OTHER): Payer: Medicare Other | Admitting: Pharmacist

## 2018-11-23 ENCOUNTER — Other Ambulatory Visit: Payer: Self-pay

## 2018-11-23 DIAGNOSIS — I2699 Other pulmonary embolism without acute cor pulmonale: Secondary | ICD-10-CM | POA: Diagnosis not present

## 2018-11-23 DIAGNOSIS — Z9181 History of falling: Secondary | ICD-10-CM | POA: Diagnosis not present

## 2018-11-23 DIAGNOSIS — I5032 Chronic diastolic (congestive) heart failure: Secondary | ICD-10-CM | POA: Diagnosis not present

## 2018-11-23 DIAGNOSIS — I272 Pulmonary hypertension, unspecified: Secondary | ICD-10-CM | POA: Diagnosis not present

## 2018-11-23 DIAGNOSIS — Z7901 Long term (current) use of anticoagulants: Secondary | ICD-10-CM | POA: Diagnosis not present

## 2018-11-23 DIAGNOSIS — Z7984 Long term (current) use of oral hypoglycemic drugs: Secondary | ICD-10-CM | POA: Diagnosis not present

## 2018-11-23 DIAGNOSIS — I110 Hypertensive heart disease with heart failure: Secondary | ICD-10-CM | POA: Diagnosis not present

## 2018-11-23 DIAGNOSIS — R55 Syncope and collapse: Secondary | ICD-10-CM | POA: Diagnosis not present

## 2018-11-23 DIAGNOSIS — Z5181 Encounter for therapeutic drug level monitoring: Secondary | ICD-10-CM | POA: Diagnosis not present

## 2018-11-23 DIAGNOSIS — E785 Hyperlipidemia, unspecified: Secondary | ICD-10-CM | POA: Diagnosis not present

## 2018-11-23 DIAGNOSIS — E119 Type 2 diabetes mellitus without complications: Secondary | ICD-10-CM | POA: Diagnosis not present

## 2018-11-23 DIAGNOSIS — Z86711 Personal history of pulmonary embolism: Secondary | ICD-10-CM | POA: Diagnosis not present

## 2018-11-23 DIAGNOSIS — E039 Hypothyroidism, unspecified: Secondary | ICD-10-CM | POA: Diagnosis not present

## 2018-11-23 LAB — POCT INR: INR: 2 (ref 2.0–3.0)

## 2018-11-23 NOTE — Patient Instructions (Signed)
Take 5 mg today then start taking 5mg  (1 peach tablet) daily except 2.5mg  (1/2 tablet) on Monday, Wednesday and Friday. Recheck INR on 11/25. Call with any new meds or procedures [phone].

## 2018-11-26 DIAGNOSIS — I272 Pulmonary hypertension, unspecified: Secondary | ICD-10-CM | POA: Diagnosis not present

## 2018-11-26 DIAGNOSIS — E039 Hypothyroidism, unspecified: Secondary | ICD-10-CM | POA: Diagnosis not present

## 2018-11-26 DIAGNOSIS — Z9181 History of falling: Secondary | ICD-10-CM | POA: Diagnosis not present

## 2018-11-26 DIAGNOSIS — E785 Hyperlipidemia, unspecified: Secondary | ICD-10-CM | POA: Diagnosis not present

## 2018-11-26 DIAGNOSIS — I110 Hypertensive heart disease with heart failure: Secondary | ICD-10-CM | POA: Diagnosis not present

## 2018-11-26 DIAGNOSIS — R55 Syncope and collapse: Secondary | ICD-10-CM | POA: Diagnosis not present

## 2018-11-26 DIAGNOSIS — E119 Type 2 diabetes mellitus without complications: Secondary | ICD-10-CM | POA: Diagnosis not present

## 2018-11-26 DIAGNOSIS — Z7901 Long term (current) use of anticoagulants: Secondary | ICD-10-CM | POA: Diagnosis not present

## 2018-11-26 DIAGNOSIS — I5032 Chronic diastolic (congestive) heart failure: Secondary | ICD-10-CM | POA: Diagnosis not present

## 2018-11-26 DIAGNOSIS — Z7984 Long term (current) use of oral hypoglycemic drugs: Secondary | ICD-10-CM | POA: Diagnosis not present

## 2018-11-26 DIAGNOSIS — Z86711 Personal history of pulmonary embolism: Secondary | ICD-10-CM | POA: Diagnosis not present

## 2018-11-27 DIAGNOSIS — I5032 Chronic diastolic (congestive) heart failure: Secondary | ICD-10-CM | POA: Diagnosis not present

## 2018-11-27 DIAGNOSIS — Z86711 Personal history of pulmonary embolism: Secondary | ICD-10-CM | POA: Diagnosis not present

## 2018-11-27 DIAGNOSIS — E039 Hypothyroidism, unspecified: Secondary | ICD-10-CM | POA: Diagnosis not present

## 2018-11-27 DIAGNOSIS — R55 Syncope and collapse: Secondary | ICD-10-CM | POA: Diagnosis not present

## 2018-11-27 DIAGNOSIS — Z9181 History of falling: Secondary | ICD-10-CM | POA: Diagnosis not present

## 2018-11-27 DIAGNOSIS — Z7984 Long term (current) use of oral hypoglycemic drugs: Secondary | ICD-10-CM | POA: Diagnosis not present

## 2018-11-27 DIAGNOSIS — Z7901 Long term (current) use of anticoagulants: Secondary | ICD-10-CM | POA: Diagnosis not present

## 2018-11-27 DIAGNOSIS — E119 Type 2 diabetes mellitus without complications: Secondary | ICD-10-CM | POA: Diagnosis not present

## 2018-11-27 DIAGNOSIS — I110 Hypertensive heart disease with heart failure: Secondary | ICD-10-CM | POA: Diagnosis not present

## 2018-11-27 DIAGNOSIS — E785 Hyperlipidemia, unspecified: Secondary | ICD-10-CM | POA: Diagnosis not present

## 2018-11-27 DIAGNOSIS — I272 Pulmonary hypertension, unspecified: Secondary | ICD-10-CM | POA: Diagnosis not present

## 2018-11-28 ENCOUNTER — Other Ambulatory Visit (INDEPENDENT_AMBULATORY_CARE_PROVIDER_SITE_OTHER): Payer: Medicare Other

## 2018-11-28 ENCOUNTER — Other Ambulatory Visit: Payer: Self-pay | Admitting: Family Medicine

## 2018-11-28 ENCOUNTER — Other Ambulatory Visit: Payer: Self-pay

## 2018-11-28 ENCOUNTER — Ambulatory Visit (INDEPENDENT_AMBULATORY_CARE_PROVIDER_SITE_OTHER): Payer: Medicare Other | Admitting: Family Medicine

## 2018-11-28 ENCOUNTER — Ambulatory Visit (INDEPENDENT_AMBULATORY_CARE_PROVIDER_SITE_OTHER): Payer: Medicare Other | Admitting: *Deleted

## 2018-11-28 ENCOUNTER — Encounter: Payer: Self-pay | Admitting: Family Medicine

## 2018-11-28 VITALS — BP 162/80 | HR 60 | Temp 97.1°F | Ht 67.0 in | Wt 155.4 lb

## 2018-11-28 DIAGNOSIS — Z5181 Encounter for therapeutic drug level monitoring: Secondary | ICD-10-CM

## 2018-11-28 DIAGNOSIS — E038 Other specified hypothyroidism: Secondary | ICD-10-CM

## 2018-11-28 DIAGNOSIS — R296 Repeated falls: Secondary | ICD-10-CM

## 2018-11-28 DIAGNOSIS — Z7901 Long term (current) use of anticoagulants: Secondary | ICD-10-CM | POA: Diagnosis not present

## 2018-11-28 DIAGNOSIS — I10 Essential (primary) hypertension: Secondary | ICD-10-CM

## 2018-11-28 DIAGNOSIS — R55 Syncope and collapse: Secondary | ICD-10-CM

## 2018-11-28 DIAGNOSIS — R002 Palpitations: Secondary | ICD-10-CM

## 2018-11-28 DIAGNOSIS — R140 Abdominal distension (gaseous): Secondary | ICD-10-CM

## 2018-11-28 DIAGNOSIS — E1142 Type 2 diabetes mellitus with diabetic polyneuropathy: Secondary | ICD-10-CM

## 2018-11-28 DIAGNOSIS — R195 Other fecal abnormalities: Secondary | ICD-10-CM

## 2018-11-28 LAB — COMPREHENSIVE METABOLIC PANEL
ALT: 7 U/L (ref 0–35)
AST: 14 U/L (ref 0–37)
Albumin: 3.9 g/dL (ref 3.5–5.2)
Alkaline Phosphatase: 46 U/L (ref 39–117)
BUN: 20 mg/dL (ref 6–23)
CO2: 26 mEq/L (ref 19–32)
Calcium: 9.7 mg/dL (ref 8.4–10.5)
Chloride: 103 mEq/L (ref 96–112)
Creatinine, Ser: 0.97 mg/dL (ref 0.40–1.20)
GFR: 67.29 mL/min (ref >60.00)
Glucose, Bld: 97 mg/dL (ref 70–99)
Potassium: 4.5 mEq/L (ref 3.5–5.1)
Sodium: 138 mEq/L (ref 135–145)
Total Bilirubin: 0.5 mg/dL (ref 0.2–1.2)
Total Protein: 7.4 g/dL (ref 6.0–8.3)

## 2018-11-28 LAB — CBC WITH DIFFERENTIAL/PLATELET
Basophils Absolute: 0.1 10*3/uL (ref 0.0–0.1)
Basophils Relative: 1.2 % (ref 0.0–3.0)
Eosinophils Absolute: 0.2 10*3/uL (ref 0.0–0.7)
Eosinophils Relative: 1.6 % (ref 0.0–5.0)
HCT: 41.7 % (ref 36.0–46.0)
Hemoglobin: 13.3 g/dL (ref 12.0–15.0)
Lymphocytes Relative: 39.4 % (ref 12.0–46.0)
Lymphs Abs: 3.8 10*3/uL (ref 0.7–4.0)
MCHC: 31.8 g/dL (ref 30.0–36.0)
MCV: 90.9 fl (ref 78.0–100.0)
Monocytes Absolute: 0.5 10*3/uL (ref 0.1–1.0)
Monocytes Relative: 5 % (ref 3.0–12.0)
Neutro Abs: 5 10*3/uL (ref 1.4–7.7)
Neutrophils Relative %: 52.8 % (ref 43.0–77.0)
Platelets: 276 10*3/uL (ref 150.0–400.0)
RBC: 4.59 Mil/uL (ref 3.87–5.11)
RDW: 16 % — ABNORMAL HIGH (ref 11.5–15.5)
WBC: 9.5 10*3/uL (ref 4.0–10.5)

## 2018-11-28 LAB — POCT INR: INR: 2.4 (ref 2.0–3.0)

## 2018-11-28 MED ORDER — METFORMIN HCL 1000 MG PO TABS: 500.0000 mg | ORAL_TABLET | Freq: Two times a day (BID) | ORAL | 1 refills | Status: DC

## 2018-11-28 MED ORDER — LEVOTHYROXINE SODIUM 25 MCG PO TABS: ORAL_TABLET | ORAL | 1 refills | Status: DC

## 2018-11-28 NOTE — Progress Notes (Signed)
Hannah [name] DOB: 08/11/1940 Encounter date: 11/28/2018  This is a 78 y.o. female who presents with Chief Complaint  Patient presents with  . Hospitalization Follow-up   History of present illness: Feels "alright". Writst cuff they brought read as 130's/90's. Not sure it is reading accurately. Our first try today with this cuff came up as Error.   Neurologist referred her for MRI, EEG. When she ended up in the hospital her daughter asked if they could just complete there.   This morning sugar is 93.   Nausea proceeded both episodes of syncope. Vomiting occurred prior to at least second.   Has been trying to drink more water. Both episodes she was dehydrated. Has had coffee and water with medication this morning. Has had breakfast.   Has been using the wrist cuff at home. Had a virtual visit with [facility] after therapist noted high bp. He checked with own cuff. BP was staying high with wrist cuff. They aren't sure about accuracy.   She is getting PT/OT now. Hasn't had CNA to house although she thought she was getting one. Bag is bothering her - has issues with "bursting" of colostomy bag. Left side of abd gets a little bigger and then bag fills with air to point of bursting. Occasionally abd gets comfortable. Not nauseated through day. Has had vomiting associated with syncope, but not otherwise. Gets watery "drain" rather than thicker stool that she used to have.  It has been this way for a long time.  She is not certain the amount of time.   Allergies  Allergen Reactions  . Augmentin [Amoxicillin-Pot Clavulanate] Itching and Other (See Comments)    Severe vaginal itching  . Tranxene [Clorazepate] Itching  . Penicillins Itching and Rash    Has patient had a PCN reaction causing immediate rash, facial/tongue/throat swelling, SOB or lightheadedness with hypotension: Yes Has patient had a PCN reaction causing severe rash involving mucus membranes or skin necrosis: No Has patient had a PCN  reaction that required hospitalization: No Has patient had a PCN reaction occurring within the last 10 years: No If all of the above answers are "NO", then may proceed with Cephalosporin use.    Current Meds  Medication Sig  . acetaminophen (TYLENOL) 500 MG tablet Take 500 mg by mouth every 6 (six) hours as needed for mild pain.   Marland Kitchen amLODipine (NORVASC) 2.5 MG tablet Take 1 tablet (2.5 mg total) by mouth at bedtime.  . Blood Glucose Monitoring Suppl (ONETOUCH VERIO) w/Device KIT 1 each by Does not apply route as directed.  . Cyanocobalamin (VITAMIN B-12 PO) Take 1 tablet by mouth daily.  . furosemide (LASIX) 20 MG tablet Take 1 tablet (20 mg total) by mouth daily as needed for fluid or edema.  Marland Kitchen glucose blood (ONETOUCH VERIO) test strip Use as instructed once a day  . levothyroxine (SYNTHROID) 25 MCG tablet TAKE 1 TABLET BY MOUTH ONCE DAILY BEFORE BREAKFAST  . lisinopril (ZESTRIL) 40 MG tablet Take 1 tablet (40 mg total) by mouth daily.  . metFORMIN (GLUCOPHAGE) 1000 MG tablet Take 0.5 tablets (500 mg total) by mouth 2 (two) times daily with a meal. TAKE ONE TABLET BY MOUTH WITH BREAKFAST AND TAKE ONE TABLET WITH DINNER  . metoprolol tartrate (LOPRESSOR) 25 MG tablet Take 1 tablet (25 mg total) by mouth 2 (two) times daily.  [name] Delica Lancets 58I MISC USE 1  TO CHECK GLUCOSE ONCE DAILY  . polyethylene glycol (MIRALAX / GLYCOLAX) packet Take 17 g  by mouth daily. (Patient taking differently: Take 17 g by mouth daily as needed for moderate constipation. )  . simvastatin (ZOCOR) 20 MG tablet Take 1 tablet (20 mg total) by mouth at bedtime.  . vitamin C (ASCORBIC ACID) 500 MG tablet Take 500 mg by mouth daily.   Marland Kitchen warfarin (COUMADIN) 5 MG tablet Take 1/2 or 1 tablet daily as directed by Coumadin clinic (Patient taking differently: Take 2.5-5 mg by mouth See admin instructions. Take 1/2 or 1 tablet daily as directed by Coumadin clinic; 5 mg (5 mg x 1) every Sun, Tue, Thu; 2.5 mg (5 mg x 0.5)  all other days)  . [DISCONTINUED] metFORMIN (GLUCOPHAGE) 1000 MG tablet TAKE ONE TABLET BY MOUTH WITH BREAKFAST AND TAKE ONE TABLET WITH DINNER    Review of Systems  Constitutional: Positive for fatigue (energy is not great). Negative for chills and fever.  Respiratory: Negative for cough, chest tightness, shortness of breath and wheezing.   Cardiovascular: Negative for chest pain, palpitations and leg swelling.  Gastrointestinal: Positive for abdominal distention. Negative for abdominal pain, blood in stool, nausea and vomiting.  Genitourinary: Negative for difficulty urinating.  Neurological: Positive for weakness (likes working with PT). Negative for dizziness, light-headedness and headaches.    Objective:  BP (!) 162/80 (BP Location: Left Arm, Patient Position: Sitting, Cuff Size: Normal)   Pulse 60   Temp (!) 97.1 F (36.2 C) (Temporal)   Ht '5\' 7"'  (1.702 m)   Wt 155 lb 6.4 oz (70.5 kg)   SpO2 96%   BMI 24.34 kg/m   Weight: 155 lb 6.4 oz (70.5 kg)   BP Readings from Last 3 Encounters:  11/28/18 (!) 162/80  11/02/18 134/75  10/25/18 (!) 152/82   Wt Readings from Last 3 Encounters:  11/28/18 155 lb 6.4 oz (70.5 kg)  10/30/18 164 lb (74.4 kg)  10/22/18 154 lb 11.2 oz (70.2 kg)    Physical Exam Constitutional:      General: She is not in acute distress.    Appearance: She is well-developed.  Cardiovascular:     Rate and Rhythm: Normal rate and regular rhythm. Occasional extrasystoles are present.    Heart sounds: Normal heart sounds. No murmur. No friction rub.  Pulmonary:     Effort: Pulmonary effort is normal. No respiratory distress.     Breath sounds: Normal breath sounds. No wheezing or rales.  Abdominal:     Comments: Abdomen is distended, and more noticeably so on the left side.  There is not significant tenderness with palpation.  Bowel sounds are active.  There is liquid greenish stool in colostomy bag.  Musculoskeletal:     Right lower leg: No edema.      Left lower leg: No edema.  Neurological:     Mental Status: She is alert and oriented to person, place, and time.  Psychiatric:        Behavior: Behavior normal.     Assessment/Plan  1. Essential hypertension Blood pressure is quite variable.  Even on recheck in the office there is a 30 point difference between initial check and recheck.  Home cuff seems to be fairly accurate within 5 points of our office cuff on recheck.  Orthostatic pressures are stable without any evidence of orthostatic hypotension.  I do not want to make any changes in medication secondary to concern of hypotension.  I have asked him to continue to monitor at home. - CBC with Differential/Platelets; Future - CMP; Future  2. Type 2 diabetes  mellitus with diabetic polyneuropathy, without long-term current use of insulin (HCC) Blood sugars have been very well controlled.  She is not due yet for recheck of A1c.  I am concerned that some of the syncopal episodes, which seem to be occurring right before eating, may be related to lower blood sugars.  Going to have her decrease her Metformin to half a tablet twice a day.  While this should not be causing hypoglycemia, she may be more sensitive to lower sugar symptoms. - metFORMIN (GLUCOPHAGE) 1000 MG tablet; Take 0.5 tablets (500 mg total) by mouth 2 (two) times daily with a meal. TAKE ONE TABLET BY MOUTH WITH BREAKFAST AND TAKE ONE TABLET WITH DINNER  Dispense: 180 tablet; Refill: 1 - CBC with Differential/Platelets; Future - CMP; Future  3. Other specified hypothyroidism - CBC with Differential/Platelets; Future - CMP; Future  4. Palpitations Patient had no sensation of palpitations.  I heard an extra beat on auscultation, but EKG appeared stable from previous and normal sinus rhythm.  No acute changes on EKG. - EKG 12-Lead - CBC with Differential/Platelets; Future - CMP; Future  5. Encounter for monitoring Coumadin therapy She is following with the Coumadin clinic for  this.  We are going to try to complete this here today so that she did not have to make a second trip, but they were unable to get a blood draw in our lab and were told we couldn't do POC INR here and so daughter took her elsewhere.  - POC INR - CBC with Differential/Platelets; Future - CMP; Future  6. Loose stools Stools been loose for over a month. - Ambulatory referral to Home Health  7. Abdominal distension (gaseous) I am concerned for a bacterial overgrowth or infection of some sort causing increased gaseous distention as well as looser stools.  We will start with some stool studies.  I am hopeful that we will be able to get these through home health nurse.  - Ambulatory referral to Home Health  8. Syncope, unspecified syncope type She has had 2 episodes of syncope.  Both were just prior to eating and involved some nausea and vomiting.  EEG and MRI have been stable.  Cardiac evaluation during ER visits was stable.  We are going to monitor blood pressure as well as blood sugar.  Also evaluating for possible abdominal infection, which may affect how she is feeling overall. - Ambulatory referral to [facility]  9. Frequent falls Continue with home physical therapy and Occupational Therapy.  We also can get home nursing. - Ambulatory referral to [facility]    Return for pending bloodwork. Looking into somebody coming out of the house I can talk with patient about assisted living options for her since she is curious about this.   Micheline Rough, MD

## 2018-11-28 NOTE — Patient Instructions (Addendum)
Description   Today take 5mg  then start taking 5mg  (1 peach tablet) daily except 2.5mg  (1/2 peach tablet) on Monday and Friday. Recheck INR in 2 weeks. Call with any new meds or procedures [phone].

## 2018-11-28 NOTE — Telephone Encounter (Unsigned)
Copied from [place] [id number]. Topic: Quick Communication - Rx Refill/Question >> Nov 28, 2018  1:41 PM Yvette Rack wrote: Medication: levothyroxine (SYNTHROID) 25 MCG tablet  Has the patient contacted their pharmacy? no  Preferred Pharmacy (with phone number or street name): [facility] ([place]), [place] - [address] [phone] (Phone)  [phone] (Fax)  Agent: Please be advised that RX refills may take up to 3 business days. We ask that you follow-up with your pharmacy.

## 2018-11-29 NOTE — Telephone Encounter (Signed)
Forwarding medication refill request to PCP for review. 

## 2018-11-29 NOTE — Telephone Encounter (Signed)
Already addressed

## 2018-11-30 DIAGNOSIS — Z86711 Personal history of pulmonary embolism: Secondary | ICD-10-CM | POA: Diagnosis not present

## 2018-11-30 DIAGNOSIS — I5032 Chronic diastolic (congestive) heart failure: Secondary | ICD-10-CM | POA: Diagnosis not present

## 2018-11-30 DIAGNOSIS — E039 Hypothyroidism, unspecified: Secondary | ICD-10-CM | POA: Diagnosis not present

## 2018-11-30 DIAGNOSIS — E785 Hyperlipidemia, unspecified: Secondary | ICD-10-CM | POA: Diagnosis not present

## 2018-11-30 DIAGNOSIS — R55 Syncope and collapse: Secondary | ICD-10-CM | POA: Diagnosis not present

## 2018-11-30 DIAGNOSIS — I110 Hypertensive heart disease with heart failure: Secondary | ICD-10-CM | POA: Diagnosis not present

## 2018-11-30 DIAGNOSIS — E119 Type 2 diabetes mellitus without complications: Secondary | ICD-10-CM | POA: Diagnosis not present

## 2018-11-30 DIAGNOSIS — I272 Pulmonary hypertension, unspecified: Secondary | ICD-10-CM | POA: Diagnosis not present

## 2018-11-30 DIAGNOSIS — Z9181 History of falling: Secondary | ICD-10-CM | POA: Diagnosis not present

## 2018-11-30 DIAGNOSIS — Z7984 Long term (current) use of oral hypoglycemic drugs: Secondary | ICD-10-CM | POA: Diagnosis not present

## 2018-11-30 DIAGNOSIS — Z7901 Long term (current) use of anticoagulants: Secondary | ICD-10-CM | POA: Diagnosis not present

## 2018-12-02 DIAGNOSIS — I110 Hypertensive heart disease with heart failure: Secondary | ICD-10-CM | POA: Diagnosis not present

## 2018-12-02 DIAGNOSIS — Z9181 History of falling: Secondary | ICD-10-CM | POA: Diagnosis not present

## 2018-12-02 DIAGNOSIS — E785 Hyperlipidemia, unspecified: Secondary | ICD-10-CM | POA: Diagnosis not present

## 2018-12-02 DIAGNOSIS — E039 Hypothyroidism, unspecified: Secondary | ICD-10-CM | POA: Diagnosis not present

## 2018-12-02 DIAGNOSIS — I272 Pulmonary hypertension, unspecified: Secondary | ICD-10-CM | POA: Diagnosis not present

## 2018-12-02 DIAGNOSIS — Z7984 Long term (current) use of oral hypoglycemic drugs: Secondary | ICD-10-CM | POA: Diagnosis not present

## 2018-12-02 DIAGNOSIS — Z7901 Long term (current) use of anticoagulants: Secondary | ICD-10-CM | POA: Diagnosis not present

## 2018-12-02 DIAGNOSIS — Z86711 Personal history of pulmonary embolism: Secondary | ICD-10-CM | POA: Diagnosis not present

## 2018-12-02 DIAGNOSIS — R55 Syncope and collapse: Secondary | ICD-10-CM | POA: Diagnosis not present

## 2018-12-02 DIAGNOSIS — E119 Type 2 diabetes mellitus without complications: Secondary | ICD-10-CM | POA: Diagnosis not present

## 2018-12-02 DIAGNOSIS — I5032 Chronic diastolic (congestive) heart failure: Secondary | ICD-10-CM | POA: Diagnosis not present

## 2018-12-04 DIAGNOSIS — E119 Type 2 diabetes mellitus without complications: Secondary | ICD-10-CM | POA: Diagnosis not present

## 2018-12-04 DIAGNOSIS — Z9181 History of falling: Secondary | ICD-10-CM | POA: Diagnosis not present

## 2018-12-04 DIAGNOSIS — I272 Pulmonary hypertension, unspecified: Secondary | ICD-10-CM | POA: Diagnosis not present

## 2018-12-04 DIAGNOSIS — R55 Syncope and collapse: Secondary | ICD-10-CM | POA: Diagnosis not present

## 2018-12-04 DIAGNOSIS — E785 Hyperlipidemia, unspecified: Secondary | ICD-10-CM | POA: Diagnosis not present

## 2018-12-04 DIAGNOSIS — I5032 Chronic diastolic (congestive) heart failure: Secondary | ICD-10-CM | POA: Diagnosis not present

## 2018-12-04 DIAGNOSIS — E039 Hypothyroidism, unspecified: Secondary | ICD-10-CM | POA: Diagnosis not present

## 2018-12-04 DIAGNOSIS — Z7901 Long term (current) use of anticoagulants: Secondary | ICD-10-CM | POA: Diagnosis not present

## 2018-12-04 DIAGNOSIS — Z7984 Long term (current) use of oral hypoglycemic drugs: Secondary | ICD-10-CM | POA: Diagnosis not present

## 2018-12-04 DIAGNOSIS — Z86711 Personal history of pulmonary embolism: Secondary | ICD-10-CM | POA: Diagnosis not present

## 2018-12-04 DIAGNOSIS — I110 Hypertensive heart disease with heart failure: Secondary | ICD-10-CM | POA: Diagnosis not present

## 2018-12-05 DIAGNOSIS — I5032 Chronic diastolic (congestive) heart failure: Secondary | ICD-10-CM | POA: Diagnosis not present

## 2018-12-05 DIAGNOSIS — E785 Hyperlipidemia, unspecified: Secondary | ICD-10-CM | POA: Diagnosis not present

## 2018-12-05 DIAGNOSIS — E039 Hypothyroidism, unspecified: Secondary | ICD-10-CM | POA: Diagnosis not present

## 2018-12-05 DIAGNOSIS — I110 Hypertensive heart disease with heart failure: Secondary | ICD-10-CM | POA: Diagnosis not present

## 2018-12-05 DIAGNOSIS — Z7984 Long term (current) use of oral hypoglycemic drugs: Secondary | ICD-10-CM | POA: Diagnosis not present

## 2018-12-05 DIAGNOSIS — I272 Pulmonary hypertension, unspecified: Secondary | ICD-10-CM | POA: Diagnosis not present

## 2018-12-05 DIAGNOSIS — Z86711 Personal history of pulmonary embolism: Secondary | ICD-10-CM | POA: Diagnosis not present

## 2018-12-05 DIAGNOSIS — Z9181 History of falling: Secondary | ICD-10-CM | POA: Diagnosis not present

## 2018-12-05 DIAGNOSIS — Z7901 Long term (current) use of anticoagulants: Secondary | ICD-10-CM | POA: Diagnosis not present

## 2018-12-05 DIAGNOSIS — R55 Syncope and collapse: Secondary | ICD-10-CM | POA: Diagnosis not present

## 2018-12-05 DIAGNOSIS — E119 Type 2 diabetes mellitus without complications: Secondary | ICD-10-CM | POA: Diagnosis not present

## 2018-12-06 DIAGNOSIS — I110 Hypertensive heart disease with heart failure: Secondary | ICD-10-CM | POA: Diagnosis not present

## 2018-12-06 DIAGNOSIS — E039 Hypothyroidism, unspecified: Secondary | ICD-10-CM | POA: Diagnosis not present

## 2018-12-06 DIAGNOSIS — Z7901 Long term (current) use of anticoagulants: Secondary | ICD-10-CM | POA: Diagnosis not present

## 2018-12-06 DIAGNOSIS — I5032 Chronic diastolic (congestive) heart failure: Secondary | ICD-10-CM | POA: Diagnosis not present

## 2018-12-06 DIAGNOSIS — E785 Hyperlipidemia, unspecified: Secondary | ICD-10-CM | POA: Diagnosis not present

## 2018-12-06 DIAGNOSIS — Z9181 History of falling: Secondary | ICD-10-CM | POA: Diagnosis not present

## 2018-12-06 DIAGNOSIS — R55 Syncope and collapse: Secondary | ICD-10-CM | POA: Diagnosis not present

## 2018-12-06 DIAGNOSIS — Z7984 Long term (current) use of oral hypoglycemic drugs: Secondary | ICD-10-CM | POA: Diagnosis not present

## 2018-12-06 DIAGNOSIS — I272 Pulmonary hypertension, unspecified: Secondary | ICD-10-CM | POA: Diagnosis not present

## 2018-12-06 DIAGNOSIS — Z86711 Personal history of pulmonary embolism: Secondary | ICD-10-CM | POA: Diagnosis not present

## 2018-12-06 DIAGNOSIS — E119 Type 2 diabetes mellitus without complications: Secondary | ICD-10-CM | POA: Diagnosis not present

## 2018-12-07 ENCOUNTER — Telehealth: Payer: Self-pay | Admitting: Family Medicine

## 2018-12-07 ENCOUNTER — Other Ambulatory Visit: Payer: Self-pay | Admitting: Family Medicine

## 2018-12-07 DIAGNOSIS — Z86711 Personal history of pulmonary embolism: Secondary | ICD-10-CM | POA: Diagnosis not present

## 2018-12-07 DIAGNOSIS — Z7901 Long term (current) use of anticoagulants: Secondary | ICD-10-CM | POA: Diagnosis not present

## 2018-12-07 DIAGNOSIS — E785 Hyperlipidemia, unspecified: Secondary | ICD-10-CM | POA: Diagnosis not present

## 2018-12-07 DIAGNOSIS — E1142 Type 2 diabetes mellitus with diabetic polyneuropathy: Secondary | ICD-10-CM

## 2018-12-07 DIAGNOSIS — E119 Type 2 diabetes mellitus without complications: Secondary | ICD-10-CM | POA: Diagnosis not present

## 2018-12-07 DIAGNOSIS — I110 Hypertensive heart disease with heart failure: Secondary | ICD-10-CM | POA: Diagnosis not present

## 2018-12-07 DIAGNOSIS — Z7984 Long term (current) use of oral hypoglycemic drugs: Secondary | ICD-10-CM | POA: Diagnosis not present

## 2018-12-07 DIAGNOSIS — E039 Hypothyroidism, unspecified: Secondary | ICD-10-CM | POA: Diagnosis not present

## 2018-12-07 DIAGNOSIS — I272 Pulmonary hypertension, unspecified: Secondary | ICD-10-CM | POA: Diagnosis not present

## 2018-12-07 DIAGNOSIS — Z9181 History of falling: Secondary | ICD-10-CM | POA: Diagnosis not present

## 2018-12-07 DIAGNOSIS — I5032 Chronic diastolic (congestive) heart failure: Secondary | ICD-10-CM | POA: Diagnosis not present

## 2018-12-07 DIAGNOSIS — R55 Syncope and collapse: Secondary | ICD-10-CM | POA: Diagnosis not present

## 2018-12-07 MED ORDER — ONETOUCH VERIO VI STRP: ORAL_STRIP | 5 refills | Status: DC

## 2018-12-07 NOTE — Telephone Encounter (Signed)
Copied from [place] [id number]. Topic: Quick Communication - Rx Refill/Question >> Dec 07, 2018  1:03 PM Leward Quan A wrote: Medication: glucose blood (ONETOUCH VERIO) test strip   Has the patient contacted their pharmacy? Yes.   (Agent: If no, request that the patient contact the pharmacy for the refill.) (Agent: If yes, when and what did the pharmacy advise?)  Preferred Pharmacy (with phone number or street name): [facility] ([place]), [place] - [address] [phone] (Phone) [phone] (Fax)    Agent: Please be advised that RX refills may take up to 3 business days. We ask that you follow-up with your pharmacy.

## 2018-12-07 NOTE — Telephone Encounter (Signed)
Copied from [facility] [id number]. Topic: General - Other >> Dec 07, 2018 12:59 PM Leward Quan A wrote: Reason for CRM: Sarah with [facility] called to inform Dr Ethlyn Gallery that patient had a 2.8 lbs weight gain overnight but is not complaining of any side effects. She also states that patient need a new lancet devise for pricking her finger to test her blood sugar. [name] Ph# [phone]

## 2018-12-07 NOTE — Telephone Encounter (Signed)
Rx sent to pharmacy   

## 2018-12-07 NOTE — Telephone Encounter (Signed)
Copied from [place] [id number]. Topic: General - Inquiry >> Dec 07, 2018  9:45 AM Berneta Levins wrote: Reason for CRM:   Merry Proud from [facility] calling to check to make sure a fax was received regarding PT orders for pt.

## 2018-12-10 NOTE — Telephone Encounter (Signed)
Spoke with Deana and informed her of the message below.  She stated the verbal order as below will allow PT visits to continue with the pt and weights are as follows:  11/23--151lbs, 11/29--156lbs, 12/4-- 153.8lbs and these were the only weights she noticed in the chart.  Message sent to Dr Ethlyn Gallery.

## 2018-12-10 NOTE — Telephone Encounter (Signed)
[place] for PT. When looking through other messages, I didn't see that weight gain message was sent to me. How is weight now? Any issues with further weight gain? Any issues with breathing?   I don't see papers for PT to sign, but I may have completed last week and don't recall?

## 2018-12-10 NOTE — Telephone Encounter (Signed)
Deana with [facility] called to check on status of PT order. They are needing a verbal asap to continue to see pt. Please advise.

## 2018-12-11 DIAGNOSIS — I5032 Chronic diastolic (congestive) heart failure: Secondary | ICD-10-CM | POA: Diagnosis not present

## 2018-12-11 DIAGNOSIS — Z7901 Long term (current) use of anticoagulants: Secondary | ICD-10-CM | POA: Diagnosis not present

## 2018-12-11 DIAGNOSIS — Z9181 History of falling: Secondary | ICD-10-CM | POA: Diagnosis not present

## 2018-12-11 DIAGNOSIS — I272 Pulmonary hypertension, unspecified: Secondary | ICD-10-CM | POA: Diagnosis not present

## 2018-12-11 DIAGNOSIS — Z86711 Personal history of pulmonary embolism: Secondary | ICD-10-CM | POA: Diagnosis not present

## 2018-12-11 DIAGNOSIS — I110 Hypertensive heart disease with heart failure: Secondary | ICD-10-CM | POA: Diagnosis not present

## 2018-12-11 DIAGNOSIS — E785 Hyperlipidemia, unspecified: Secondary | ICD-10-CM | POA: Diagnosis not present

## 2018-12-11 DIAGNOSIS — E119 Type 2 diabetes mellitus without complications: Secondary | ICD-10-CM | POA: Diagnosis not present

## 2018-12-11 DIAGNOSIS — E039 Hypothyroidism, unspecified: Secondary | ICD-10-CM | POA: Diagnosis not present

## 2018-12-11 DIAGNOSIS — R55 Syncope and collapse: Secondary | ICD-10-CM | POA: Diagnosis not present

## 2018-12-11 DIAGNOSIS — Z7984 Long term (current) use of oral hypoglycemic drugs: Secondary | ICD-10-CM | POA: Diagnosis not present

## 2018-12-11 NOTE — Telephone Encounter (Signed)
I called Deana and informed her of the message below.  I also gave verbal orders per Dr Ethlyn Gallery for a Education officer, museum as the pt requested to be placed in assisted living. Orders for stool studies from 11/25 were printed and faxed to [phone] and a message was sent to Reche [place] also.

## 2018-12-11 NOTE — Telephone Encounter (Signed)
Ok; looks like weight has been stable now. Please just let us know if she has any further weight gains/swelling.

## 2018-12-12 ENCOUNTER — Ambulatory Visit: Payer: Medicare Other | Admitting: *Deleted

## 2018-12-12 ENCOUNTER — Other Ambulatory Visit: Payer: Self-pay

## 2018-12-12 DIAGNOSIS — I2699 Other pulmonary embolism without acute cor pulmonale: Secondary | ICD-10-CM | POA: Diagnosis not present

## 2018-12-12 DIAGNOSIS — E785 Hyperlipidemia, unspecified: Secondary | ICD-10-CM | POA: Diagnosis not present

## 2018-12-12 DIAGNOSIS — Z9181 History of falling: Secondary | ICD-10-CM | POA: Diagnosis not present

## 2018-12-12 DIAGNOSIS — Z86711 Personal history of pulmonary embolism: Secondary | ICD-10-CM | POA: Diagnosis not present

## 2018-12-12 DIAGNOSIS — Z5181 Encounter for therapeutic drug level monitoring: Secondary | ICD-10-CM

## 2018-12-12 DIAGNOSIS — I110 Hypertensive heart disease with heart failure: Secondary | ICD-10-CM | POA: Diagnosis not present

## 2018-12-12 DIAGNOSIS — Z7984 Long term (current) use of oral hypoglycemic drugs: Secondary | ICD-10-CM | POA: Diagnosis not present

## 2018-12-12 DIAGNOSIS — E119 Type 2 diabetes mellitus without complications: Secondary | ICD-10-CM | POA: Diagnosis not present

## 2018-12-12 DIAGNOSIS — Z7901 Long term (current) use of anticoagulants: Secondary | ICD-10-CM

## 2018-12-12 DIAGNOSIS — E039 Hypothyroidism, unspecified: Secondary | ICD-10-CM | POA: Diagnosis not present

## 2018-12-12 DIAGNOSIS — I272 Pulmonary hypertension, unspecified: Secondary | ICD-10-CM | POA: Diagnosis not present

## 2018-12-12 DIAGNOSIS — I5032 Chronic diastolic (congestive) heart failure: Secondary | ICD-10-CM | POA: Diagnosis not present

## 2018-12-12 DIAGNOSIS — R55 Syncope and collapse: Secondary | ICD-10-CM | POA: Diagnosis not present

## 2018-12-12 LAB — POCT INR: INR: 2.8 (ref 2.0–3.0)

## 2018-12-12 NOTE — Patient Instructions (Signed)
Description   Continue taking 5mg  (1 peach tablet) daily except 2.5mg  (1/2 peach tablet) on Monday and Friday. Recheck INR in 4 weeks. Call with any new meds or procedures [phone].

## 2018-12-13 ENCOUNTER — Telehealth: Payer: Self-pay | Admitting: Family Medicine

## 2018-12-13 DIAGNOSIS — E039 Hypothyroidism, unspecified: Secondary | ICD-10-CM | POA: Diagnosis not present

## 2018-12-13 DIAGNOSIS — Z7901 Long term (current) use of anticoagulants: Secondary | ICD-10-CM | POA: Diagnosis not present

## 2018-12-13 DIAGNOSIS — R55 Syncope and collapse: Secondary | ICD-10-CM | POA: Diagnosis not present

## 2018-12-13 DIAGNOSIS — I272 Pulmonary hypertension, unspecified: Secondary | ICD-10-CM | POA: Diagnosis not present

## 2018-12-13 DIAGNOSIS — E785 Hyperlipidemia, unspecified: Secondary | ICD-10-CM | POA: Diagnosis not present

## 2018-12-13 DIAGNOSIS — Z7984 Long term (current) use of oral hypoglycemic drugs: Secondary | ICD-10-CM | POA: Diagnosis not present

## 2018-12-13 DIAGNOSIS — I5032 Chronic diastolic (congestive) heart failure: Secondary | ICD-10-CM | POA: Diagnosis not present

## 2018-12-13 DIAGNOSIS — E119 Type 2 diabetes mellitus without complications: Secondary | ICD-10-CM | POA: Diagnosis not present

## 2018-12-13 DIAGNOSIS — I110 Hypertensive heart disease with heart failure: Secondary | ICD-10-CM | POA: Diagnosis not present

## 2018-12-13 DIAGNOSIS — Z9181 History of falling: Secondary | ICD-10-CM | POA: Diagnosis not present

## 2018-12-13 DIAGNOSIS — Z86711 Personal history of pulmonary embolism: Secondary | ICD-10-CM | POA: Diagnosis not present

## 2018-12-13 NOTE — Telephone Encounter (Signed)
Hannah [name], from [facility], called and is requesting to have an order placed to collect pts stool. [name] states pt was not able to go to her appt today without having her stool checked. [name] states she will be seeing pt at 11:30 today. Please advise.

## 2018-12-13 NOTE — Telephone Encounter (Signed)
Please advise 

## 2018-12-13 NOTE — Telephone Encounter (Signed)
Spoke with [name] and informed her the orders were faxed on 12/8 and Deana was informed of this.  [name] stated she will check with Deana and call back if needed.

## 2018-12-13 NOTE — Telephone Encounter (Signed)
Hannah [name] called in stating they still cannot find the fax. Hannah [name] is asking someone give her a call for a verbal, as she will be seeing patient today. Please advise.

## 2018-12-14 ENCOUNTER — Other Ambulatory Visit: Payer: Self-pay | Admitting: Internal Medicine

## 2018-12-14 ENCOUNTER — Other Ambulatory Visit: Payer: Self-pay

## 2018-12-14 ENCOUNTER — Encounter: Payer: Self-pay | Admitting: Family Medicine

## 2018-12-14 ENCOUNTER — Inpatient Hospital Stay (HOSPITAL_COMMUNITY)
Admission: EM | Admit: 2018-12-14 | Discharge: 2018-12-16 | DRG: 303 | Disposition: A | Discharge status: Home / Self Care | Payer: Medicare Other | Attending: Internal Medicine | Admitting: Internal Medicine

## 2018-12-14 ENCOUNTER — Emergency Department (HOSPITAL_COMMUNITY): Payer: Medicare Other

## 2018-12-14 ENCOUNTER — Encounter (HOSPITAL_COMMUNITY): Payer: Self-pay | Admitting: Emergency Medicine

## 2018-12-14 ENCOUNTER — Ambulatory Visit: Payer: Medicare Other | Admitting: Family Medicine

## 2018-12-14 DIAGNOSIS — Z7984 Long term (current) use of oral hypoglycemic drugs: Secondary | ICD-10-CM | POA: Diagnosis not present

## 2018-12-14 DIAGNOSIS — Z933 Colostomy status: Secondary | ICD-10-CM | POA: Diagnosis not present

## 2018-12-14 DIAGNOSIS — R079 Chest pain, unspecified: Secondary | ICD-10-CM | POA: Diagnosis not present

## 2018-12-14 DIAGNOSIS — I2510 Atherosclerotic heart disease of native coronary artery without angina pectoris: Principal | ICD-10-CM | POA: Diagnosis present

## 2018-12-14 DIAGNOSIS — E119 Type 2 diabetes mellitus without complications: Secondary | ICD-10-CM | POA: Diagnosis not present

## 2018-12-14 DIAGNOSIS — R072 Precordial pain: Secondary | ICD-10-CM | POA: Diagnosis not present

## 2018-12-14 DIAGNOSIS — E1142 Type 2 diabetes mellitus with diabetic polyneuropathy: Secondary | ICD-10-CM | POA: Diagnosis not present

## 2018-12-14 DIAGNOSIS — Z7901 Long term (current) use of anticoagulants: Secondary | ICD-10-CM

## 2018-12-14 DIAGNOSIS — R778 Other specified abnormalities of plasma proteins: Secondary | ICD-10-CM | POA: Diagnosis not present

## 2018-12-14 DIAGNOSIS — N1831 Chronic kidney disease, stage 3a: Secondary | ICD-10-CM | POA: Diagnosis present

## 2018-12-14 DIAGNOSIS — E1122 Type 2 diabetes mellitus with diabetic chronic kidney disease: Secondary | ICD-10-CM | POA: Diagnosis present

## 2018-12-14 DIAGNOSIS — R509 Fever, unspecified: Secondary | ICD-10-CM | POA: Diagnosis not present

## 2018-12-14 DIAGNOSIS — Z8674 Personal history of sudden cardiac arrest: Secondary | ICD-10-CM | POA: Diagnosis not present

## 2018-12-14 DIAGNOSIS — I272 Pulmonary hypertension, unspecified: Secondary | ICD-10-CM | POA: Diagnosis not present

## 2018-12-14 DIAGNOSIS — I110 Hypertensive heart disease with heart failure: Secondary | ICD-10-CM | POA: Diagnosis not present

## 2018-12-14 DIAGNOSIS — R0789 Other chest pain: Secondary | ICD-10-CM | POA: Diagnosis present

## 2018-12-14 DIAGNOSIS — I160 Hypertensive urgency: Secondary | ICD-10-CM | POA: Diagnosis not present

## 2018-12-14 DIAGNOSIS — E1149 Type 2 diabetes mellitus with other diabetic neurological complication: Secondary | ICD-10-CM | POA: Diagnosis present

## 2018-12-14 DIAGNOSIS — M1990 Unspecified osteoarthritis, unspecified site: Secondary | ICD-10-CM | POA: Diagnosis not present

## 2018-12-14 DIAGNOSIS — I129 Hypertensive chronic kidney disease with stage 1 through stage 4 chronic kidney disease, or unspecified chronic kidney disease: Secondary | ICD-10-CM | POA: Diagnosis present

## 2018-12-14 DIAGNOSIS — Z79899 Other long term (current) drug therapy: Secondary | ICD-10-CM

## 2018-12-14 DIAGNOSIS — Z95828 Presence of other vascular implants and grafts: Secondary | ICD-10-CM

## 2018-12-14 DIAGNOSIS — R7989 Other specified abnormal findings of blood chemistry: Secondary | ICD-10-CM | POA: Diagnosis not present

## 2018-12-14 DIAGNOSIS — E038 Other specified hypothyroidism: Secondary | ICD-10-CM | POA: Diagnosis not present

## 2018-12-14 DIAGNOSIS — Z803 Family history of malignant neoplasm of breast: Secondary | ICD-10-CM

## 2018-12-14 DIAGNOSIS — I447 Left bundle-branch block, unspecified: Secondary | ICD-10-CM | POA: Diagnosis present

## 2018-12-14 DIAGNOSIS — Z9181 History of falling: Secondary | ICD-10-CM | POA: Diagnosis not present

## 2018-12-14 DIAGNOSIS — E039 Hypothyroidism, unspecified: Secondary | ICD-10-CM | POA: Diagnosis not present

## 2018-12-14 DIAGNOSIS — Z9049 Acquired absence of other specified parts of digestive tract: Secondary | ICD-10-CM

## 2018-12-14 DIAGNOSIS — I10 Essential (primary) hypertension: Secondary | ICD-10-CM | POA: Diagnosis not present

## 2018-12-14 DIAGNOSIS — E7849 Other hyperlipidemia: Secondary | ICD-10-CM | POA: Diagnosis not present

## 2018-12-14 DIAGNOSIS — Z86711 Personal history of pulmonary embolism: Secondary | ICD-10-CM

## 2018-12-14 DIAGNOSIS — E785 Hyperlipidemia, unspecified: Secondary | ICD-10-CM | POA: Diagnosis present

## 2018-12-14 DIAGNOSIS — R55 Syncope and collapse: Secondary | ICD-10-CM | POA: Diagnosis not present

## 2018-12-14 DIAGNOSIS — Z20828 Contact with and (suspected) exposure to other viral communicable diseases: Secondary | ICD-10-CM | POA: Diagnosis present

## 2018-12-14 DIAGNOSIS — E1140 Type 2 diabetes mellitus with diabetic neuropathy, unspecified: Secondary | ICD-10-CM | POA: Diagnosis not present

## 2018-12-14 DIAGNOSIS — R05 Cough: Secondary | ICD-10-CM | POA: Diagnosis not present

## 2018-12-14 DIAGNOSIS — R0602 Shortness of breath: Secondary | ICD-10-CM | POA: Diagnosis not present

## 2018-12-14 DIAGNOSIS — I5032 Chronic diastolic (congestive) heart failure: Secondary | ICD-10-CM | POA: Diagnosis not present

## 2018-12-14 LAB — CBC
HCT: 46.2 % — ABNORMAL HIGH (ref 36.0–46.0)
Hemoglobin: 14 g/dL (ref 12.0–15.0)
MCH: 28.5 pg (ref 26.0–34.0)
MCHC: 30.3 g/dL (ref 30.0–36.0)
MCV: 93.9 fL (ref 80.0–100.0)
Platelets: 244 10*3/uL (ref 150–400)
RBC: 4.92 MIL/uL (ref 3.87–5.11)
RDW: 15.1 % (ref 11.5–15.5)
WBC: 8.2 10*3/uL (ref 4.0–10.5)
nRBC: 0 % (ref 0.0–0.2)

## 2018-12-14 LAB — BASIC METABOLIC PANEL
Anion gap: 12 (ref 5–15)
BUN: 13 mg/dL (ref 8–23)
CO2: 20 mmol/L — ABNORMAL LOW (ref 22–32)
Calcium: 9.7 mg/dL (ref 8.9–10.3)
Chloride: 109 mmol/L (ref 98–111)
Creatinine, Ser: 1.14 mg/dL — ABNORMAL HIGH (ref 0.44–1.00)
GFR calc Af Amer: 54 mL/min — ABNORMAL LOW (ref >60)
GFR calc non Af Amer: 46 mL/min — ABNORMAL LOW (ref >60)
Glucose, Bld: 119 mg/dL — ABNORMAL HIGH (ref 70–99)
Potassium: 3.9 mmol/L (ref 3.5–5.1)
Sodium: 141 mmol/L (ref 135–145)

## 2018-12-14 LAB — TROPONIN I (HIGH SENSITIVITY)
Troponin I (High Sensitivity): 14 ng/L (ref <18)
Troponin I (High Sensitivity): 32 ng/L — ABNORMAL HIGH (ref <18)

## 2018-12-14 NOTE — ED Triage Notes (Signed)
Patient states she takes Coumadin daily, when she picked up her prescriptions she was not given it. Patient states she started having chest pain when she was looking for the medication and got herself worked up and nervous.

## 2018-12-14 NOTE — Telephone Encounter (Signed)
Spoke with [name] and informed her I was out of the office when the request was sent on 12/10.  Verbal orders given today for the 6 stool tests from 11/25 and approval given for an as needed visit as Dr Ethlyn Gallery wanted this testing to be done.

## 2018-12-15 ENCOUNTER — Emergency Department (HOSPITAL_COMMUNITY): Payer: Medicare Other

## 2018-12-15 ENCOUNTER — Encounter (HOSPITAL_COMMUNITY): Payer: Self-pay | Admitting: Internal Medicine

## 2018-12-15 DIAGNOSIS — E7849 Other hyperlipidemia: Secondary | ICD-10-CM

## 2018-12-15 DIAGNOSIS — R0789 Other chest pain: Secondary | ICD-10-CM | POA: Diagnosis present

## 2018-12-15 DIAGNOSIS — R778 Other specified abnormalities of plasma proteins: Secondary | ICD-10-CM | POA: Diagnosis not present

## 2018-12-15 DIAGNOSIS — N1831 Chronic kidney disease, stage 3a: Secondary | ICD-10-CM

## 2018-12-15 DIAGNOSIS — Z8674 Personal history of sudden cardiac arrest: Secondary | ICD-10-CM | POA: Diagnosis not present

## 2018-12-15 DIAGNOSIS — E1142 Type 2 diabetes mellitus with diabetic polyneuropathy: Secondary | ICD-10-CM | POA: Diagnosis present

## 2018-12-15 DIAGNOSIS — Z20828 Contact with and (suspected) exposure to other viral communicable diseases: Secondary | ICD-10-CM | POA: Diagnosis present

## 2018-12-15 DIAGNOSIS — I10 Essential (primary) hypertension: Secondary | ICD-10-CM | POA: Diagnosis not present

## 2018-12-15 DIAGNOSIS — Z803 Family history of malignant neoplasm of breast: Secondary | ICD-10-CM | POA: Diagnosis not present

## 2018-12-15 DIAGNOSIS — E039 Hypothyroidism, unspecified: Secondary | ICD-10-CM | POA: Diagnosis present

## 2018-12-15 DIAGNOSIS — Z86711 Personal history of pulmonary embolism: Secondary | ICD-10-CM | POA: Diagnosis not present

## 2018-12-15 DIAGNOSIS — I160 Hypertensive urgency: Secondary | ICD-10-CM

## 2018-12-15 DIAGNOSIS — I447 Left bundle-branch block, unspecified: Secondary | ICD-10-CM | POA: Diagnosis present

## 2018-12-15 DIAGNOSIS — E1140 Type 2 diabetes mellitus with diabetic neuropathy, unspecified: Secondary | ICD-10-CM

## 2018-12-15 DIAGNOSIS — Z933 Colostomy status: Secondary | ICD-10-CM | POA: Diagnosis not present

## 2018-12-15 DIAGNOSIS — Z79899 Other long term (current) drug therapy: Secondary | ICD-10-CM | POA: Diagnosis not present

## 2018-12-15 DIAGNOSIS — Z7984 Long term (current) use of oral hypoglycemic drugs: Secondary | ICD-10-CM | POA: Diagnosis not present

## 2018-12-15 DIAGNOSIS — R079 Chest pain, unspecified: Secondary | ICD-10-CM | POA: Diagnosis not present

## 2018-12-15 DIAGNOSIS — I2510 Atherosclerotic heart disease of native coronary artery without angina pectoris: Secondary | ICD-10-CM | POA: Diagnosis present

## 2018-12-15 DIAGNOSIS — E1122 Type 2 diabetes mellitus with diabetic chronic kidney disease: Secondary | ICD-10-CM | POA: Diagnosis present

## 2018-12-15 DIAGNOSIS — M1990 Unspecified osteoarthritis, unspecified site: Secondary | ICD-10-CM | POA: Diagnosis present

## 2018-12-15 DIAGNOSIS — R072 Precordial pain: Secondary | ICD-10-CM | POA: Diagnosis not present

## 2018-12-15 DIAGNOSIS — I129 Hypertensive chronic kidney disease with stage 1 through stage 4 chronic kidney disease, or unspecified chronic kidney disease: Secondary | ICD-10-CM | POA: Diagnosis present

## 2018-12-15 DIAGNOSIS — Z7901 Long term (current) use of anticoagulants: Secondary | ICD-10-CM | POA: Diagnosis not present

## 2018-12-15 DIAGNOSIS — Z95828 Presence of other vascular implants and grafts: Secondary | ICD-10-CM | POA: Diagnosis not present

## 2018-12-15 DIAGNOSIS — R7989 Other specified abnormal findings of blood chemistry: Secondary | ICD-10-CM | POA: Diagnosis not present

## 2018-12-15 DIAGNOSIS — Z9049 Acquired absence of other specified parts of digestive tract: Secondary | ICD-10-CM | POA: Diagnosis not present

## 2018-12-15 DIAGNOSIS — E785 Hyperlipidemia, unspecified: Secondary | ICD-10-CM | POA: Diagnosis present

## 2018-12-15 DIAGNOSIS — E038 Other specified hypothyroidism: Secondary | ICD-10-CM

## 2018-12-15 HISTORY — DX: Chronic kidney disease, stage 3a: N18.31

## 2018-12-15 LAB — TROPONIN I (HIGH SENSITIVITY): Troponin I (High Sensitivity): 8 ng/L (ref <18)

## 2018-12-15 LAB — GLUCOSE, CAPILLARY
Glucose-Capillary: 157 mg/dL — ABNORMAL HIGH (ref 70–99)
Glucose-Capillary: 109 mg/dL — ABNORMAL HIGH (ref 70–99)
Glucose-Capillary: 111 mg/dL — ABNORMAL HIGH (ref 70–99)

## 2018-12-15 LAB — HEMOGLOBIN A1C
Hgb A1c MFr Bld: 6.1 % — ABNORMAL HIGH (ref 4.8–5.6)
Mean Plasma Glucose: 128.37 mg/dL

## 2018-12-15 LAB — LIPID PANEL
Cholesterol: 174 mg/dL (ref 0–200)
HDL: 76 mg/dL (ref >40)
LDL Cholesterol: 81 mg/dL (ref 0–99)
Total CHOL/HDL Ratio: 2.3 RATIO
Triglycerides: 86 mg/dL (ref <150)
VLDL: 17 mg/dL (ref 0–40)

## 2018-12-15 LAB — CBG MONITORING, ED
Glucose-Capillary: 60 mg/dL — ABNORMAL LOW (ref 70–99)
Glucose-Capillary: 84 mg/dL (ref 70–99)

## 2018-12-15 LAB — PROTIME-INR
INR: 2.5 — ABNORMAL HIGH (ref 0.8–1.2)
Prothrombin Time: 26.8 s — ABNORMAL HIGH (ref 11.4–15.2)

## 2018-12-15 LAB — TSH: TSH: 2.052 u[IU]/mL (ref 0.350–4.500)

## 2018-12-15 LAB — SARS CORONAVIRUS 2 (TAT 6-24 HRS): SARS Coronavirus 2: NEGATIVE

## 2018-12-15 MED ORDER — HYDRALAZINE HCL 20 MG/ML IJ SOLN
10.0000 mg | INTRAMUSCULAR | Status: DC | PRN
  Administered 2018-12-15: 10 mg via INTRAVENOUS
  Filled 2018-12-15: qty 1

## 2018-12-15 MED ORDER — WARFARIN SODIUM 7.5 MG PO TABS
7.5000 mg | ORAL_TABLET | Freq: Once | ORAL | Status: AC
  Administered 2018-12-15: 7.5 mg via ORAL
  Filled 2018-12-15: qty 1

## 2018-12-15 MED ORDER — ONDANSETRON HCL 4 MG/2ML IJ SOLN: 4.0000 mg | Freq: Four times a day (QID) | INTRAMUSCULAR | Status: DC | PRN

## 2018-12-15 MED ORDER — SIMVASTATIN 20 MG PO TABS
20.0000 mg | ORAL_TABLET | Freq: Every day | ORAL | Status: DC
  Administered 2018-12-15: 20 mg via ORAL
  Filled 2018-12-15: qty 1

## 2018-12-15 MED ORDER — METOPROLOL TARTRATE 25 MG PO TABS
25.0000 mg | ORAL_TABLET | Freq: Two times a day (BID) | ORAL | Status: DC
  Administered 2018-12-15: 25 mg via ORAL
  Filled 2018-12-15: qty 1

## 2018-12-15 MED ORDER — AMLODIPINE BESYLATE 5 MG PO TABS
5.0000 mg | ORAL_TABLET | Freq: Every day | ORAL | Status: DC
  Administered 2018-12-15: 5 mg via ORAL
  Filled 2018-12-15: qty 1

## 2018-12-15 MED ORDER — LISINOPRIL 40 MG PO TABS
40.0000 mg | ORAL_TABLET | Freq: Every day | ORAL | Status: DC
  Administered 2018-12-15 – 2018-12-16 (×2): 40 mg via ORAL
  Filled 2018-12-15 (×3): qty 1

## 2018-12-15 MED ORDER — METOPROLOL TARTRATE 50 MG PO TABS
50.0000 mg | ORAL_TABLET | Freq: Two times a day (BID) | ORAL | Status: DC
  Administered 2018-12-15: 50 mg via ORAL
  Filled 2018-12-15 (×2): qty 1

## 2018-12-15 MED ORDER — INSULIN ASPART 100 UNIT/ML ~~LOC~~ SOLN
0.0000 [IU] | Freq: Three times a day (TID) | SUBCUTANEOUS | Status: DC
  Administered 2018-12-15: 2 [IU] via SUBCUTANEOUS
  Administered 2018-12-16: 1 [IU] via SUBCUTANEOUS

## 2018-12-15 MED ORDER — WARFARIN - PHARMACIST DOSING INPATIENT
Freq: Every day | Status: DC
  Administered 2018-12-16: 18:00:00

## 2018-12-15 MED ORDER — ACETAMINOPHEN 325 MG PO TABS: 650.0000 mg | ORAL_TABLET | ORAL | Status: DC | PRN

## 2018-12-15 MED ORDER — POLYETHYLENE GLYCOL 3350 17 G PO PACK: 17.0000 g | PACK | Freq: Every day | ORAL | Status: DC | PRN

## 2018-12-15 MED ORDER — ALUM & MAG HYDROXIDE-SIMETH 200-200-20 MG/5ML PO SUSP: 30.0000 mL | Freq: Four times a day (QID) | ORAL | Status: DC | PRN

## 2018-12-15 MED ORDER — AMLODIPINE BESYLATE 2.5 MG PO TABS: 2.5000 mg | ORAL_TABLET | Freq: Every day | ORAL | Status: DC

## 2018-12-15 MED ORDER — IOHEXOL 350 MG/ML SOLN
100.0000 mL | Freq: Once | INTRAVENOUS | Status: AC | PRN
  Administered 2018-12-15: 65 mL via INTRAVENOUS

## 2018-12-15 MED ORDER — LEVOTHYROXINE SODIUM 25 MCG PO TABS
25.0000 ug | ORAL_TABLET | Freq: Every day | ORAL | Status: DC
  Administered 2018-12-15: 25 ug via ORAL
  Filled 2018-12-15 (×3): qty 1

## 2018-12-15 MED ORDER — FUROSEMIDE 20 MG PO TABS: 20.0000 mg | ORAL_TABLET | Freq: Every day | ORAL | Status: DC | PRN

## 2018-12-15 NOTE — Consult Note (Signed)
[facility] Nurse ostomy consult note Stoma type/location: LUQ colostomy. Last seen by my partner approximately 1 year ago.  At that time, patient was independent in the management of her well-established ostomy (2016, Dr. Brantley Stage). Stomal assessment/size: Not seen today Ostomy pouching: 2pc. Flat 2 and 3/4 inch pouching system.  Pouch is [employer], Skin barrier is Kellie Simmering # 2. Enrolled patient in [place] program: No Nursing staff to assist patient as needed with her ostomy (changing, emptying). [facility] nursing team will not follow, but will remain available to this patient, the nursing and medical teams.  Please re-consult if needed. Thanks, Maudie Flakes, MSN, RN, [place], Arther Abbott  Pager# [phone]

## 2018-12-15 NOTE — Consult Note (Signed)
Cardiology Consultation:   Patient ID: Hannah [name] MRN: [id number]; DOB: 01/11/1940  Admit date: 12/14/2018 Date of Consult: 12/15/2018  Primary Care Provider: [name], MD Primary Cardiologist: Dorris Carnes, MD  Primary Electrophysiologist:  None    Patient Profile:   Hannah [name] is a 78 y.o. female with a hx of PE on life long coumadin, DM-2, HTN, HLD, Hypothyroidism, and hx of large bowel stricture with colectomy and colostomy who is being seen today for the evaluation of  Chest pain and elevated troponin at the request of Dr. Tamala Julian.  History of Present Illness:   [name] with above hx and recent admit with syncope due to dehydration, she also had prior syncope due to dehydration. she does not take in much liquids.   Her last echo was 10/31/18 with EF 55-60%, no LVH, G1DD, mild MR, mild TR, normal pulmonary artery systolic pressure.  I do not find cardiac nuc study or cath in my reveiw of the records.   Hx of IVC filter.  Pt presented to ER after not finding coumadin in her medications from pharmacy.  She was stressed looking for the meds and developed chest pain.    EKG:  The EKG was personally reviewed and demonstrates:  SR at 82, LAFB, LVH poor r wave progression Telemetry:  Telemetry was personally reviewed and demonstrates:  None available   HS troponin 14; 32 Na 141, k+ 3.9, Cr 1.14 up from 0.97, K+ 3.9 Hgb 14, Hct 46 WBC 8.2   INR 2.5   CTA of chest with no PE, chronic inflammation  Coronary artery calcification and aortic atherosclerotic Calcification. CXR No active cardiopulmonary disease    Heart Pathway Score:     Past Medical History:  Diagnosis Date  . Acute massive pulmonary embolism ([place]) 07/13/2012   Massive PE w/ PEA arrest 07/13/12 >TNK >IVC filter >discharged on comadin    . Diabetes mellitus, type 2 ([place]) 07/15/2012   with peripheral neuropathy  . Hyperlipemia 07/14/2012  . Hypertension 07/14/2012  . Large bowel stricture ([facility])    s/p  colectomy in 2016  . Osteoarthritis    s/p hip and knee replacements  . Syncope 10/2018  . Thyroid disease     Past Surgical History:  Procedure Laterality Date  . COLONOSCOPY N/A 03/12/2016   Procedure: COLONOSCOPY;  Surgeon: Teena Irani, MD;  Location: [facility] ENDOSCOPY;  Service: Endoscopy;  Laterality: N/A;  . COLOSTOMY N/A 06/03/2014   Procedure: COLOSTOMY;  Surgeon: Erroll Luna, MD;  Location: [place];  Service: General;  Laterality: N/A;  . FLEXIBLE SIGMOIDOSCOPY N/A 05/30/2014   Procedure: Beryle Quant;  Surgeon: Carol Ada, MD;  Location: [facility] ENDOSCOPY;  Service: Endoscopy;  Laterality: N/A;  . INSERTION OF VENA CAVA FILTER N/A 07/16/2012   Procedure: INSERTION OF VENA CAVA FILTER;  Surgeon: Elam Dutch, MD;  Location: [facility] CATH LAB;  Service: Cardiovascular;  Laterality: N/A;  . KNEE ARTHROSCOPY    . PARTIAL COLECTOMY N/A 06/03/2014   Procedure: PARTIAL COLECTOMY;  Surgeon: Erroll Luna, MD;  Location: [place];  Service: General;  Laterality: N/A;  . REDUCTION MAMMAPLASTY Bilateral   . SP ARTHRO HIP*L*       Home Medications:  Prior to Admission medications   Medication Sig Start Date End Date Taking? Authorizing Provider  acetaminophen (TYLENOL) 500 MG tablet Take 500 mg by mouth every 6 (six) hours as needed for mild pain.    Yes [provider]  amLODipine (NORVASC) 2.5 MG tablet Take 1 tablet (  2.5 mg total) by mouth at bedtime. 09/21/18  Yes Koberlein, Junell C, MD  Cyanocobalamin (VITAMIN B-12 PO) Take 1 tablet by mouth daily.   Yes [provider]  furosemide (LASIX) 20 MG tablet Take 1 tablet (20 mg total) by mouth daily as needed for fluid or edema. 06/28/18  Yes Lucretia Kern, DO  levothyroxine (SYNTHROID) 25 MCG tablet TAKE 1 TABLET BY MOUTH ONCE DAILY BEFORE BREAKFAST Patient taking differently: Take 25 mcg by mouth daily before breakfast.  11/28/18  Yes Koberlein, Junell C, MD  lisinopril (ZESTRIL) 40 MG tablet Take 1 tablet (40 mg total) by  mouth daily. 09/12/18  Yes Koberlein, Junell C, MD  metFORMIN (GLUCOPHAGE) 1000 MG tablet Take 0.5 tablets (500 mg total) by mouth 2 (two) times daily with a meal. TAKE ONE TABLET BY MOUTH WITH BREAKFAST AND TAKE ONE TABLET WITH DINNER 11/28/18  Yes Koberlein, Junell C, MD  metoprolol tartrate (LOPRESSOR) 25 MG tablet Take 1 tablet (25 mg total) by mouth 2 (two) times daily. 09/12/18  Yes Koberlein, Junell C, MD  polyethylene glycol (MIRALAX / GLYCOLAX) packet Take 17 g by mouth daily. Patient taking differently: Take 17 g by mouth daily as needed for moderate constipation.  03/19/17  Yes Elodia Florence., MD  simvastatin (ZOCOR) 20 MG tablet Take 1 tablet (20 mg total) by mouth at bedtime. 09/12/18  Yes Koberlein, Steele Berg, MD  vitamin C (ASCORBIC ACID) 500 MG tablet Take 500 mg by mouth daily.    Yes [provider]  warfarin (COUMADIN) 5 MG tablet Take 1/2 or 1 tablet daily as directed by Coumadin clinic Patient taking differently: Take 2.5-5 mg by mouth See admin instructions. Take 1 tablet on Sunday then take 1/2 tablet all the other days 07/16/18  Yes Fay Records, MD  Blood Glucose Monitoring Suppl [facility] VERIO) w/Device KIT 1 each by Does not apply route as directed. 06/07/18   [name], MD  glucose blood (ONETOUCH VERIO) test strip Use as instructed once a day 12/07/18   [name], MD  OneTouch Delica Lancets 94W MISC USE 1  TO CHECK GLUCOSE ONCE DAILY 07/02/18   [name], MD    Inpatient Medications: Scheduled Meds: . amLODipine  2.5 mg Oral QHS  . insulin aspart  0-9 Units Subcutaneous TID WC  . levothyroxine  25 mcg Oral Q0600  . lisinopril  40 mg Oral Daily  . metoprolol tartrate  25 mg Oral BID  . simvastatin  20 mg Oral QHS   Continuous Infusions:  PRN Meds: acetaminophen, alum & mag hydroxide-simeth, furosemide, hydrALAZINE, ondansetron (ZOFRAN) IV, polyethylene glycol  Allergies:    Allergies  Allergen Reactions  . Augmentin  [Amoxicillin-Pot Clavulanate] Itching and Other (See Comments)    Severe vaginal itching  . Tranxene [Clorazepate] Itching  . Penicillins Itching and Rash    Has patient had a PCN reaction causing immediate rash, facial/tongue/throat swelling, SOB or lightheadedness with hypotension: Yes Has patient had a PCN reaction causing severe rash involving mucus membranes or skin necrosis: No Has patient had a PCN reaction that required hospitalization: No Has patient had a PCN reaction occurring within the last 10 years: No If all of the above answers are "NO", then may proceed with Cephalosporin use.     Social History:   Social History   Socioeconomic History  . Marital status: Widowed    Spouse name: Not on file  . Number of children: Not on file  .  Years of education: Not on file  . Highest education level: Not on file  Occupational History  . Not on file  Tobacco Use  . Smoking status: Former Smoker    Packs/day: 1.00    Years: 10.00    Pack years: 10.00    Types: Cigarettes    Quit date: 01/04/1968    Years since quitting: 50.9  . Smokeless tobacco: Never Used  Substance and Sexual Activity  . Alcohol use: No  . Drug use: No  . Sexual activity: Not on file  Other Topics Concern  . Not on file  Social History Narrative   Lives alone.          Social Determinants of Health   Financial Resource Strain:   . Difficulty of Paying Living Expenses: Not on file  Food Insecurity:   . Worried About Charity fundraiser in the Last Year: Not on file  . Ran Out of Food in the Last Year: Not on file  Transportation Needs:   . Lack of Transportation (Medical): Not on file  . Lack of Transportation (Non-Medical): Not on file  Physical Activity:   . Days of Exercise per Week: Not on file  . Minutes of Exercise per Session: Not on file  Stress:   . Feeling of Stress : Not on file  Social Connections:   . Frequency of Communication with Friends and Family: Not on file  . Frequency  of Social Gatherings with Friends and Family: Not on file  . Attends Religious Services: Not on file  . Active Member of Clubs or Organizations: Not on file  . Attends Archivist Meetings: Not on file  . Marital Status: Not on file  Intimate Partner Violence:   . Fear of Current or Ex-Partner: Not on file  . Emotionally Abused: Not on file  . Physically Abused: Not on file  . Sexually Abused: Not on file    Family History:    Family History  Problem Relation Age of Onset  . Breast cancer Mother      ROS:  Please see the history of present illness.  General:no colds or fevers, no weight changes Skin:no rashes or ulcers HEENT:no blurred vision, no congestion CV:see HPI PUL:see HPI GI:no diarrhea constipation or melena, no indigestion GU:no hematuria, no dysuria MS:no joint pain, no claudication Neuro:no syncope, no lightheadedness Endo:no diabetes, no thyroid disease  All other ROS reviewed and negative.     Physical Exam/Data:   Vitals:   12/15/18 0800 12/15/18 0830 12/15/18 0845 12/15/18 0923  BP: (!) 172/96 (!) 173/77 (!) 172/84 (!) 189/113  Pulse:    96  Resp: _0 Temp:    (!) 97.4 F (36.3 C)  TempSrc:    Oral  SpO2:    100%  Weight:    68.3 kg  Height:    _1  (1.702 m)    Intake/Output Summary (Last 24 hours) at 12/15/2018 0957 Last data filed at 12/15/2018 0740 Gross per 24 hour  Intake --  Output 300 ml  Net -300 ml   Last 3 Weights 12/15/2018 12/15/2018 11/28/2018  Weight (lbs) 150 lb 8 oz 151 lb 6.4 oz 155 lb 6.4 oz  Weight (kg) 68.266 kg 68.675 kg 70.489 kg     Body mass index is 23.57 kg/m.  Exam per Dr. Debara Pickett   Relevant CV Studies: Echo 10/31/18 IMPRESSIONS    1. Left ventricular ejection fraction, by visual estimation, is 55 to 60%.  The left ventricle has normal function. There is no left ventricular hypertrophy.  2. Left ventricular diastolic parameters are consistent with Grade I diastolic dysfunction (impaired  relaxation).  3. Global right ventricle has normal systolic function.The right ventricular size is normal. No increase in right ventricular wall thickness.  4. Left atrial size was normal.  5. Right atrial size was normal.  6. Mild mitral annular calcification.  7. The mitral valve is normal in structure. Mild mitral valve regurgitation. No evidence of mitral stenosis.  8. The tricuspid valve is normal in structure. Tricuspid valve regurgitation is mild.  9. The aortic valve is tricuspid. Aortic valve regurgitation is not visualized. No evidence of aortic valve sclerosis or stenosis. 10. The pulmonic valve was normal in structure. Pulmonic valve regurgitation is not visualized. 11. Normal pulmonary artery systolic pressure. 12. The inferior vena cava is normal in size with greater than 50% respiratory variability, suggesting right atrial pressure of 3 mmHg.  FINDINGS  Left Ventricle: Left ventricular ejection fraction, by visual estimation, is 55 to 60%. The left ventricle has normal function. There is no left ventricular hypertrophy. Left ventricular diastolic parameters are consistent with Grade I diastolic  dysfunction (impaired relaxation). Indeterminate filling pressures.  Right Ventricle: The right ventricular size is normal. No increase in right ventricular wall thickness. Global RV systolic function is has normal systolic function. The tricuspid regurgitant velocity is 2.62 m/s, and with an assumed right atrial pressure  of 3 mmHg, the estimated right ventricular systolic pressure is normal at 30.5 mmHg.  Left Atrium: Left atrial size was normal in size.  Right Atrium: Right atrial size was normal in size  Pericardium: There is no evidence of pericardial effusion.  Mitral Valve: The mitral valve is normal in structure. Mild mitral annular calcification. No evidence of mitral valve stenosis by observation. Mild mitral valve regurgitation.  Tricuspid Valve: The tricuspid valve  is normal in structure. Tricuspid valve regurgitation is mild.  Aortic Valve: The aortic valve is tricuspid. Aortic valve regurgitation is not visualized. The aortic valve is structurally normal, with no evidence of sclerosis or stenosis.  Pulmonic Valve: The pulmonic valve was normal in structure. Pulmonic valve regurgitation is not visualized.  Aorta: The aortic root, ascending aorta and aortic arch are all structurally normal, with no evidence of dilitation or obstruction.  Venous: The inferior vena cava is normal in size with greater than 50% respiratory variability, suggesting right atrial pressure of 3 mmHg.  IAS/Shunts: No atrial level shunt detected by color flow Doppler. No ventricular septal defect is seen or detected. There is no evidence of an atrial septal defect.   Laboratory Data:  High Sensitivity Troponin:   Recent Labs  Lab 12/14/18 1900 12/14/18 2202  TROPONINIHS 14 32*     Chemistry Recent Labs  Lab 12/14/18 1900  NA 141  K 3.9  CL 109  CO2 20*  GLUCOSE 119*  BUN 13  CREATININE 1.14*  CALCIUM 9.7  GFRNONAA 46*  GFRAA 54*  ANIONGAP 12    No results for input(s): PROT, ALBUMIN, AST, ALT, ALKPHOS, BILITOT in the last 168 hours. Hematology Recent Labs  Lab 12/14/18 1900  WBC 8.2  RBC 4.92  HGB 14.0  HCT 46.2*  MCV 93.9  MCH 28.5  MCHC 30.3  RDW 15.1  PLT 244   BNPNo results for input(s): BNP, PROBNP in the last 168 hours.  DDimer No results for input(s): DDIMER in the last 168 hours.   Radiology/Studies:  DG Chest 2 View  Result Date: 12/14/2018 CLINICAL DATA:  Patient states she started having L sided chest pain when she was looking for the medication and got herself worked up and nervous. Patient states she takes Coumadin daily, when she picked up her prescriptions she was not given it. Pt denies cough, SOB, nor fever. Pt states she is normally no blood thinners. Hx of acute massive pulmonary embolism, diabetes, HTN. EXAM: CHEST - 2  VIEW COMPARISON:  10/30/2018 FINDINGS: Cardiac silhouette is normal in size and configuration. No mediastinal or hilar masses. No evidence of adenopathy. Clear lungs.  No pleural effusion or pneumothorax. Skeletal structures are demineralized but intact. IMPRESSION: No active cardiopulmonary disease. Electronically Signed   By: Lajean Manes M.D.   On: 12/14/2018 19:32   CT Angio Chest PE W and/or Wo Contrast  Result Date: 12/15/2018 CLINICAL DATA:  Chest pain.  Concern for pulmonary embolism EXAM: CT ANGIOGRAPHY CHEST WITH CONTRAST TECHNIQUE: Multidetector CT imaging of the chest was performed using the standard protocol during bolus administration of intravenous contrast. Multiplanar CT image reconstructions and MIPs were obtained to evaluate the vascular anatomy. CONTRAST:  6m OMNIPAQUE IOHEXOL 350 MG/ML SOLN COMPARISON:  None. FINDINGS: Cardiovascular: No filling defects within the pulmonary arteries to suggest acute pulmonary embolism. No acute findings of the aorta or great vessels. No pericardial fluid. Coronary artery calcification and aortic atherosclerotic calcification. Mediastinum/Nodes: No axillary supraclavicular adenopathy. No mediastinal hilar adenopathy no pericardial effusion Lungs/Pleura: No pulmonary infarction. Mild branching ground-glass densities in the LEFT upper lobe (image [id number] of series 6). Findings are not changed from 1 year prior consistent benign chronic inflammation. Upper Abdomen: Limited view of the liver, kidneys, pancreas are unremarkable. Normal adrenal glands. Musculoskeletal: No aggressive osseous lesion Review of the MIP images confirms the above findings. IMPRESSION: 1. No evidence acute pulmonary embolism. 2. No acute pulmonary parenchymal findings. 3. Coronary artery calcification and Aortic Atherosclerosis (ICD10-I70.0). Electronically Signed   By: SSuzy BouchardM.D.   On: 12/15/2018 07:51    Assessment and Plan:   1. Chest pain  Troponin 14;32  No acute  EKG changes. She does have risk factors for CAD with age, HTN, HLD, and diabetes.   2. CAD on CT of chest - no prior ischemic eval found. 3. Hx PE on coumadin and IVC filter. INR therapeutic 4. DM-2 per IM       For questions or updates, please contact CReesePlease consult [email or web address] for contact info under     Signed, LCecilie Kicks NP  12/15/2018 9:57 AM

## 2018-12-15 NOTE — Progress Notes (Signed)
ANTICOAGULATION CONSULT NOTE - Initial Consult  Pharmacy Consult for warfarin Indication: pulmonary embolus history   Allergies  Allergen Reactions  . Augmentin [Amoxicillin-Pot Clavulanate] Itching and Other (See Comments)    Severe vaginal itching  . Tranxene [Clorazepate] Itching  . Penicillins Itching and Rash    Has patient had a PCN reaction causing immediate rash, facial/tongue/throat swelling, SOB or lightheadedness with hypotension: Yes Has patient had a PCN reaction causing severe rash involving mucus membranes or skin necrosis: No Has patient had a PCN reaction that required hospitalization: No Has patient had a PCN reaction occurring within the last 10 years: No If all of the above answers are "NO", then may proceed with Cephalosporin use.     Patient Measurements: Height: '5\' 7"'  (170.2 cm) Weight: 150 lb 8 oz (68.3 kg) IBW/kg (Calculated) : 61.6  Vital Signs: Temp: 97.4 F (36.3 C) (12/12 0923) Temp Source: Oral (12/12 0923) BP: 189/113 (12/12 0923) Pulse Rate: 96 (12/12 0923)  Labs: Recent Labs    12/14/18 1900 12/14/18 2202 12/15/18 0430 12/15/18 1039  HGB 14.0  --   --   --   HCT 46.2*  --   --   --   PLT 244  --   --   --   LABPROT  --   --  26.8*  --   INR  --   --  2.5*  --   CREATININE 1.14*  --   --   --   TROPONINIHS 14 32*  --  8    Estimated Creatinine Clearance: 40.2 mL/min (A) (by C-G formula based on SCr of 1.14 mg/dL (H)).   Medical History: Past Medical History:  Diagnosis Date  . Acute massive pulmonary embolism ([place]) 07/13/2012   Massive PE w/ PEA arrest 07/13/12 >TNK >IVC filter >discharged on comadin    . Diabetes mellitus, type 2 ([place]) 07/15/2012   with peripheral neuropathy  . Hyperlipemia 07/14/2012  . Hypertension 07/14/2012  . Large bowel stricture ([facility])    s/p colectomy in 2016  . Osteoarthritis    s/p hip and knee replacements  . Syncope 10/2018  . Thyroid disease     Medications:  Medications Prior to Admission   Medication Sig Dispense Refill Last Dose  . acetaminophen (TYLENOL) 500 MG tablet Take 500 mg by mouth every 6 (six) hours as needed for mild pain.    Past Month at Unknown time  . amLODipine (NORVASC) 2.5 MG tablet Take 1 tablet (2.5 mg total) by mouth at bedtime. 30 tablet 2 12/13/2018 at Unknown time  . Cyanocobalamin (VITAMIN B-12 PO) Take 1 tablet by mouth daily.   12/14/2018 at Unknown time  . furosemide (LASIX) 20 MG tablet Take 1 tablet (20 mg total) by mouth daily as needed for fluid or edema. 30 tablet 3 Past Month at Unknown time  . levothyroxine (SYNTHROID) 25 MCG tablet TAKE 1 TABLET BY MOUTH ONCE DAILY BEFORE BREAKFAST (Patient taking differently: Take 25 mcg by mouth daily before breakfast. ) 90 tablet 1 12/14/2018 at Unknown time  . lisinopril (ZESTRIL) 40 MG tablet Take 1 tablet (40 mg total) by mouth daily. 90 tablet 1 12/14/2018 at Unknown time  . metFORMIN (GLUCOPHAGE) 1000 MG tablet Take 0.5 tablets (500 mg total) by mouth 2 (two) times daily with a meal. TAKE ONE TABLET BY MOUTH WITH BREAKFAST AND TAKE ONE TABLET WITH DINNER 180 tablet 1 12/14/2018 at Unknown time  . metoprolol tartrate (LOPRESSOR) 25 MG tablet Take 1 tablet (25 mg  total) by mouth 2 (two) times daily. 180 tablet 1 12/14/2018 at 0800  . polyethylene glycol (MIRALAX / GLYCOLAX) packet Take 17 g by mouth daily. (Patient taking differently: Take 17 g by mouth daily as needed for moderate constipation. ) 14 each 0 Past Month at Unknown time  . simvastatin (ZOCOR) 20 MG tablet Take 1 tablet (20 mg total) by mouth at bedtime. 90 tablet 1 12/13/2018 at Unknown time  . vitamin C (ASCORBIC ACID) 500 MG tablet Take 500 mg by mouth daily.    12/14/2018 at Unknown time  . warfarin (COUMADIN) 5 MG tablet Take 1/2 or 1 tablet daily as directed by Coumadin clinic (Patient taking differently: Take 2.5-5 mg by mouth See admin instructions. Take 1 tablet on Sunday then take 1/2 tablet all the other days) 30 tablet 2 12/13/2018 at  1800  . Blood Glucose Monitoring Suppl (ONETOUCH VERIO) w/Device KIT 1 each by Does not apply route as directed. 1 kit 0   . glucose blood (ONETOUCH VERIO) test strip Use as instructed once a day 100 each 5   . OneTouch Delica Lancets 05L MISC USE 1  TO CHECK GLUCOSE ONCE DAILY 100 each 3     Assessment: Patient is a 70 yof on warfarin PTA for history of PE in 2014, presenting with chest pain. 12/12 CT negative for PE. INR on admission is therapeutic at 2.5 with patient reported last dose of warfarin on 12/10. Hgb and platelets are wnl at 14 and 244. Will dose slightly higher than her PTA dose since she missed one dose yesterday and is at the low end of her therapeutic range.   PTA warfarin dose per OP cardiology visit on 12/9: 2.5 mg every Monday and Friday, 5 mg all other days   Goal of Therapy:  INR 2.5-3.5 Monitor platelets by anticoagulation protocol: Yes    Plan:  Warfarin 7.5 mg x 1 tonight  Follow daily INR, CBC, and monitor for signs of bleeding     Thank you,   Eddie Candle, PharmD PGY-1 Pharmacy Resident   Please check amion for clinical pharmacist contact number   12/15/2018,12:01 PM

## 2018-12-15 NOTE — ED Provider Notes (Signed)
[facility] EMERGENCY DEPARTMENT Provider Note  CSN: [id number] Arrival date & time: 12/14/18 1834  Chief Complaint(s) No chief complaint on file.  HPI Hannah [name] is a 78 y.o. female with a history of PE on Coumadin who presents to the emergency department with sudden onset substernal chest pressure radiating to her right shoulder with associated shortness of breath.  Not exacerbated with exertion.  Has significantly improved since onset several hours ago.  Pain is new per patient.  Reports that she got worked up after her Coumadin was not called into the pharmacy and she was concerned that she was not going to get her dose of Coumadin today.  She denies any associated nausea or vomiting.  No recent fevers or infections.  No abdominal pain.  No other physical complaints.  HPI  Past Medical History Past Medical History:  Diagnosis Date  . Acute massive pulmonary embolism ([place]) 07/13/2012   Massive PE w/ PEA arrest 07/13/12 >TNK >IVC filter >discharged on comadin    . Diabetes mellitus, type 2 ([place]) 07/15/2012   with peripheral neuropathy  . Hyperlipemia 07/14/2012  . Hypertension 07/14/2012  . Large bowel stricture ([facility])    s/p colectomy in 2016  . Osteoarthritis    s/p hip and knee replacements  . Syncope 10/2018  . Thyroid disease    Patient Active Problem List   Diagnosis Date Noted  . Dehydration 11/02/2018  . Syncope 10/30/2018  . Frequent falls 05/21/2018  . Encounter for therapeutic drug monitoring 05/07/2018  . Large bowel stricture ([facility]) 12/15/2017  . Chronic diastolic CHF (congestive heart failure) ([place]) 03/15/2017  . Anticoagulated on warfarin   . Pulmonary hypertension ([place]) 07/26/2016  . Neuropathy 02/12/2015  . Osteoarthritis 08/12/2014  . History of pulmonary embolism 06/10/2013  . Hypothyroid 06/06/2013  . Essential hypertension 06/06/2013  . Diabetes mellitus with neurological manifestations, controlled ([place]) 06/06/2013  . DM2 (diabetes  mellitus, type 2) ([place]) 07/23/2012   Home Medication(s) Prior to Admission medications   Medication Sig Start Date End Date Taking? Authorizing Provider  acetaminophen (TYLENOL) 500 MG tablet Take 500 mg by mouth every 6 (six) hours as needed for mild pain.    Yes [provider]  amLODipine (NORVASC) 2.5 MG tablet Take 1 tablet (2.5 mg total) by mouth at bedtime. 09/21/18  Yes Koberlein, Junell C, MD  Cyanocobalamin (VITAMIN B-12 PO) Take 1 tablet by mouth daily.   Yes [provider]  furosemide (LASIX) 20 MG tablet Take 1 tablet (20 mg total) by mouth daily as needed for fluid or edema. 06/28/18  Yes Lucretia Kern, DO  levothyroxine (SYNTHROID) 25 MCG tablet TAKE 1 TABLET BY MOUTH ONCE DAILY BEFORE BREAKFAST Patient taking differently: Take 25 mcg by mouth daily before breakfast.  11/28/18  Yes Koberlein, Junell C, MD  lisinopril (ZESTRIL) 40 MG tablet Take 1 tablet (40 mg total) by mouth daily. 09/12/18  Yes Koberlein, Junell C, MD  metFORMIN (GLUCOPHAGE) 1000 MG tablet Take 0.5 tablets (500 mg total) by mouth 2 (two) times daily with a meal. TAKE ONE TABLET BY MOUTH WITH BREAKFAST AND TAKE ONE TABLET WITH DINNER 11/28/18  Yes Koberlein, Junell C, MD  metoprolol tartrate (LOPRESSOR) 25 MG tablet Take 1 tablet (25 mg total) by mouth 2 (two) times daily. 09/12/18  Yes Koberlein, Junell C, MD  polyethylene glycol (MIRALAX / GLYCOLAX) packet Take 17 g by mouth daily. Patient taking differently: Take 17 g by mouth daily as needed for moderate constipation.  03/19/17  Yes Elodia Florence., MD  simvastatin (ZOCOR) 20 MG tablet Take 1 tablet (20 mg total) by mouth at bedtime. 09/12/18  Yes Koberlein, Steele Berg, MD  vitamin C (ASCORBIC ACID) 500 MG tablet Take 500 mg by mouth daily.    Yes [provider]  warfarin (COUMADIN) 5 MG tablet Take 1/2 or 1 tablet daily as directed by Coumadin clinic Patient taking differently: Take 2.5-5 mg by mouth See admin instructions. Take 1  tablet on Sunday then take 1/2 tablet all the other days 07/16/18  Yes Fay Records, MD  Blood Glucose Monitoring Suppl [facility] VERIO) w/Device KIT 1 each by Does not apply route as directed. 06/07/18   [name], MD  glucose blood (ONETOUCH VERIO) test strip Use as instructed once a day 12/07/18   [name], MD  OneTouch Delica Lancets 53Z MISC USE 1  TO CHECK GLUCOSE ONCE DAILY 07/02/18   [name], MD                                                                                                                                    Past Surgical History Past Surgical History:  Procedure Laterality Date  . COLONOSCOPY N/A 03/12/2016   Procedure: COLONOSCOPY;  Surgeon: Teena Irani, MD;  Location: [facility] ENDOSCOPY;  Service: Endoscopy;  Laterality: N/A;  . COLOSTOMY N/A 06/03/2014   Procedure: COLOSTOMY;  Surgeon: Erroll Luna, MD;  Location: [facility];  Service: General;  Laterality: N/A;  . FLEXIBLE SIGMOIDOSCOPY N/A 05/30/2014   Procedure: Beryle Quant;  Surgeon: Carol Ada, MD;  Location: [facility] ENDOSCOPY;  Service: Endoscopy;  Laterality: N/A;  . INSERTION OF VENA CAVA FILTER N/A 07/16/2012   Procedure: INSERTION OF VENA CAVA FILTER;  Surgeon: Elam Dutch, MD;  Location: [facility] CATH LAB;  Service: Cardiovascular;  Laterality: N/A;  . KNEE ARTHROSCOPY    . PARTIAL COLECTOMY N/A 06/03/2014   Procedure: PARTIAL COLECTOMY;  Surgeon: Erroll Luna, MD;  Location: [facility];  Service: General;  Laterality: N/A;  . REDUCTION MAMMAPLASTY Bilateral   . SP ARTHRO HIP*L*     Family History Family History  Problem Relation Age of Onset  . Breast cancer Mother     Social History Social History   Tobacco Use  . Smoking status: Former Smoker    Packs/day: 1.00    Years: 10.00    Pack years: 10.00    Types: Cigarettes    Quit date: 01/04/1968    Years since quitting: 50.9  . Smokeless tobacco: Never Used  Substance Use Topics  . Alcohol use: No  . Drug use: No    Allergies Augmentin [amoxicillin-pot clavulanate], Tranxene [clorazepate], and Penicillins  Review of Systems Review of Systems All other systems are reviewed and are negative for acute change except as noted in the HPI  Physical Exam Vital Signs  I have reviewed the triage vital signs BP (!) 151/65  Pulse 81   Temp 98.1 F (36.7 C) (Oral)   Resp 14   SpO2 100%   Physical Exam Vitals reviewed.  Constitutional:      General: She is not in acute distress.    Appearance: She is well-developed. She is not diaphoretic.  HENT:     Head: Normocephalic and atraumatic.     Nose: Nose normal.  Eyes:     General: No scleral icterus.       Right eye: No discharge.        Left eye: No discharge.     Conjunctiva/sclera: Conjunctivae normal.     Pupils: Pupils are equal, round, and reactive to light.  Cardiovascular:     Rate and Rhythm: Normal rate and regular rhythm.     Heart sounds: No murmur. No friction rub. No gallop.   Pulmonary:     Effort: Pulmonary effort is normal. No respiratory distress.     Breath sounds: Normal breath sounds. No stridor. No rales.  Abdominal:     General: There is no distension.     Palpations: Abdomen is soft.     Tenderness: There is no abdominal tenderness.    Musculoskeletal:        General: No tenderness.     Cervical back: Normal range of motion and neck supple.  Skin:    General: Skin is warm and dry.     Findings: No erythema or rash.  Neurological:     Mental Status: She is alert and oriented to person, place, and time.     ED Results and Treatments Labs (all labs ordered are listed, but only abnormal results are displayed) Labs Reviewed  BASIC METABOLIC PANEL - Abnormal; Notable for the following components:      Result Value   CO2 20 (*)    Glucose, Bld 119 (*)    Creatinine, Ser 1.14 (*)    GFR calc non Af Amer 46 (*)    GFR calc Af Amer 54 (*)    All other components within normal limits  CBC - Abnormal; Notable for  the following components:   HCT 46.2 (*)    All other components within normal limits  PROTIME-INR - Abnormal; Notable for the following components:   Prothrombin Time 26.8 (*)    INR 2.5 (*)    All other components within normal limits  TROPONIN I (HIGH SENSITIVITY) - Abnormal; Notable for the following components:   Troponin I (High Sensitivity) 32 (*)    All other components within normal limits  SARS CORONAVIRUS 2 (TAT 6-24 HRS)  TROPONIN I (HIGH SENSITIVITY)                                                                                                                         EKG  EKG Interpretation  Date/Time:  Friday December 14 2018 19:00:21 EST Ventricular Rate:  82 PR Interval:  156 QRS Duration: 120 QT Interval:  358 QTC Calculation: 418 R  Axis:   -85 Text Interpretation: Normal sinus rhythm Left anterior fascicular block Left ventricular hypertrophy with QRS widening ( R in aVL , Cornell product ) Cannot rule out Septal infarct , age undetermined Abnormal ECG Does not meet sgarbossa criteria for STEMI, no significant changes from Oct 2020 ecg Reconfirmed by Addison Lank [phone]) on 12/15/2018 2:22:17 AM      Radiology DG Chest 2 View  Result Date: 12/14/2018 CLINICAL DATA:  Patient states she started having L sided chest pain when she was looking for the medication and got herself worked up and nervous. Patient states she takes Coumadin daily, when she picked up her prescriptions she was not given it. Pt denies cough, SOB, nor fever. Pt states she is normally no blood thinners. Hx of acute massive pulmonary embolism, diabetes, HTN. EXAM: CHEST - 2 VIEW COMPARISON:  10/30/2018 FINDINGS: Cardiac silhouette is normal in size and configuration. No mediastinal or hilar masses. No evidence of adenopathy. Clear lungs.  No pleural effusion or pneumothorax. Skeletal structures are demineralized but intact. IMPRESSION: No active cardiopulmonary disease. Electronically Signed   By:  Lajean Manes M.D.   On: 12/14/2018 19:32    Pertinent labs & imaging results that were available during my care of the patient were reviewed by me and considered in my medical decision making (see chart for details).  Medications Ordered in ED Medications  iohexol (OMNIPAQUE) 350 MG/ML injection 100 mL (65 mLs Intravenous Contrast Given 12/15/18 0618)                                                                                                                                    Procedures Ultrasound ED Peripheral IV (Provider)  Date/Time: 12/15/2018 6:24 AM Performed by: Fatima Blank, MD Authorized by: Fatima Blank, MD   Procedure details:    Indications: multiple failed IV attempts     Skin Prep: chlorhexidine gluconate     Location:  Right AC   Angiocath:  20 G   Bedside Ultrasound Guided: Yes     Images: archived     Patient tolerated procedure without complications: Yes     Dressing applied: Yes    .Critical Care Performed by: Fatima Blank, MD Authorized by: Fatima Blank, MD    CRITICAL CARE Performed by: Grayce Sessions Waldine Zenz Total critical care time: 40 minutes Critical care time was exclusive of separately billable procedures and treating other patients. Critical care was necessary to treat or prevent imminent or life-threatening deterioration. Critical care was time spent personally by me on the following activities: development of treatment plan with patient and/or surrogate as well as nursing, discussions with consultants, evaluation of patient's response to treatment, examination of patient, obtaining history from patient or surrogate, ordering and performing treatments and interventions, ordering and review of laboratory studies, ordering and review of radiographic studies, pulse oximetry and re-evaluation of patient's condition.    (including critical care  time)  Medical Decision Making / ED Course I have reviewed the  nursing notes for this encounter and the patient's prior records (if available in EHR or on provided paperwork).   Keyarra Rendall was evaluated in Emergency Department on 12/15/2018 for the symptoms described in the history of present illness. She was evaluated in the context of the global COVID-19 pandemic, which necessitated consideration that the patient might be at risk for infection with the SARS-CoV-2 virus that causes COVID-19. Institutional protocols and algorithms that pertain to the evaluation of patients at risk for COVID-19 are in a state of rapid change based on information released by regulatory bodies including the CDC and federal and state organizations. These policies and algorithms were followed during the patient's care in the ED.  Substernal chest pain with shortness of breath. EKG without acute ischemic changes.  Initial troponin negative.  Delta troponin uptrending and now positive.  Chest x-ray without evidence suggestive of pneumonia, pneumothorax, pneumomediastinum.  No abnormal contour of the mediastinum to suggest dissection. No evidence of acute injuries.  CTA without evidence of PE.  INR therapeutic.  Will require admission to trend troponins.      Final Clinical Impression(s) / ED Diagnoses Final diagnoses:  Precordial pain  Elevated troponin      This chart was dictated using voice recognition software.  Despite best efforts to proofread,  errors can occur which can change the documentation meaning.   Fatima Blank, MD 12/16/18 [phone]

## 2018-12-15 NOTE — ED Notes (Signed)
Spoke with daughter per pt request. Provided update of plan of care. Daughter verbalized understanding of plan.

## 2018-12-15 NOTE — H&P (Signed)
History and Physical    Hannah [name]:[id number] DOB: [birth year] DOA: 12/14/2018  Referring MD/NP/PA: Addison Lank, MD PCP: Hannah Macadam, MD  Patient coming from: Home  Chief Complaint: Chest tightness  I have personally briefly reviewed patient's old medical records in [place]   HPI: Hannah [name] is a 78 y.o. female with medical history significant of hypertension, hyperlipidemia, diabetes mellitus type 2, history of PE with PEA arrest in 2014, and arthritis.  She presents with complaints of chest tightness started yesterday after the occupational therapist that just left.  Associated symptoms include pain underneath her right armpit.  Denies any significant fever, chills, nausea, vomiting, abdominal pain, or lightheadedness.  Reported having similar pain once before that went away on its own.  Patient thought symptoms could be related to arthritis, but did not want to dismiss them due to history of having a pulmonary embolus.  Of note she had also sent in a refill for her Coumadin yesterday, but when her daughter went to pick up the prescriptions refill for Coumadin was never sent in to the pharmacy.  She reports strict compliance with Coumadin regiment, but missed yesterday's dose.  ED Course: Upon admission into the emergency department patient was seen to have stable vital signs.  Labs significant for creatinine 1.14, INR 2.5, and high-sensitivity troponin 14 -> 32.  EKG did not show any acute abnormalities.  Chest x-ray did not show any acute abnormalities.  TRH called to admit the acute rise in troponin.   Review of Systems  Constitutional: Negative for fever and malaise/fatigue.  HENT: Negative for congestion and nosebleeds.   Eyes: Negative for double vision and photophobia.  Respiratory: Negative for cough and wheezing.   Cardiovascular: Positive for chest pain. Negative for leg swelling.  Gastrointestinal: Negative for diarrhea, nausea and vomiting.    Genitourinary: Negative for dysuria and hematuria.  Musculoskeletal: Positive for joint pain.  Skin: Negative for itching and rash.  Neurological: Negative for focal weakness and loss of consciousness.  Endo/Heme/Allergies: Negative for polydipsia. Does not bruise/bleed easily.  Psychiatric/Behavioral: Negative for memory loss and substance abuse.    Past Medical History:  Diagnosis Date  . Acute massive pulmonary embolism ([place]) 07/13/2012   Massive PE w/ PEA arrest 07/13/12 >TNK >IVC filter >discharged on comadin    . Diabetes mellitus, type 2 ([place]) 07/15/2012   with peripheral neuropathy  . Hyperlipemia 07/14/2012  . Hypertension 07/14/2012  . Large bowel stricture ([facility])    s/p colectomy in 2016  . Osteoarthritis    s/p hip and knee replacements  . Syncope 10/2018  . Thyroid disease     Past Surgical History:  Procedure Laterality Date  . COLONOSCOPY N/A 03/12/2016   Procedure: COLONOSCOPY;  Surgeon: Hannah Irani, MD;  Location: [facility] ENDOSCOPY;  Service: Endoscopy;  Laterality: N/A;  . COLOSTOMY N/A 06/03/2014   Procedure: COLOSTOMY;  Surgeon: Hannah Luna, MD;  Location: [place];  Service: General;  Laterality: N/A;  . FLEXIBLE SIGMOIDOSCOPY N/A 05/30/2014   Procedure: Hannah [name];  Surgeon: Hannah Ada, MD;  Location: [facility] ENDOSCOPY;  Service: Endoscopy;  Laterality: N/A;  . INSERTION OF VENA CAVA FILTER N/A 07/16/2012   Procedure: INSERTION OF VENA CAVA FILTER;  Surgeon: Hannah Dutch, MD;  Location: [facility] CATH LAB;  Service: Cardiovascular;  Laterality: N/A;  . KNEE ARTHROSCOPY    . PARTIAL COLECTOMY N/A 06/03/2014   Procedure: PARTIAL COLECTOMY;  Surgeon: Hannah Luna, MD;  Location: [place];  Service: General;  Laterality: N/A;  .  REDUCTION MAMMAPLASTY Bilateral   . SP ARTHRO HIP*L*       reports that she quit smoking about 50 years ago. Her smoking use included cigarettes. She has a 10.00 pack-year smoking history. She has never used smokeless tobacco. She reports that  she does not drink alcohol or use drugs.  Allergies  Allergen Reactions  . Augmentin [Amoxicillin-Pot Clavulanate] Itching and Other (See Comments)    Severe vaginal itching  . Tranxene [Clorazepate] Itching  . Penicillins Itching and Rash    Has patient had a PCN reaction causing immediate rash, facial/tongue/throat swelling, SOB or lightheadedness with hypotension: Yes Has patient had a PCN reaction causing severe rash involving mucus membranes or skin necrosis: No Has patient had a PCN reaction that required hospitalization: No Has patient had a PCN reaction occurring within the last 10 years: No If all of the above answers are "NO", then may proceed with Cephalosporin use.     Family History  Problem Relation Age of Onset  . Breast cancer Mother     Prior to Admission medications   Medication Sig Start Date End Date Taking? Authorizing Provider  acetaminophen (TYLENOL) 500 MG tablet Take 500 mg by mouth every 6 (six) hours as needed for mild pain.    Yes [provider]  amLODipine (NORVASC) 2.5 MG tablet Take 1 tablet (2.5 mg total) by mouth at bedtime. 09/21/18  Yes Koberlein, Junell C, MD  Cyanocobalamin (VITAMIN B-12 PO) Take 1 tablet by mouth daily.   Yes [provider]  furosemide (LASIX) 20 MG tablet Take 1 tablet (20 mg total) by mouth daily as needed for fluid or edema. 06/28/18  Yes Hannah Kern, DO  levothyroxine (SYNTHROID) 25 MCG tablet TAKE 1 TABLET BY MOUTH ONCE DAILY BEFORE BREAKFAST Patient taking differently: Take 25 mcg by mouth daily before breakfast.  11/28/18  Yes Koberlein, Junell C, MD  lisinopril (ZESTRIL) 40 MG tablet Take 1 tablet (40 mg total) by mouth daily. 09/12/18  Yes Koberlein, Junell C, MD  metFORMIN (GLUCOPHAGE) 1000 MG tablet Take 0.5 tablets (500 mg total) by mouth 2 (two) times daily with a meal. TAKE ONE TABLET BY MOUTH WITH BREAKFAST AND TAKE ONE TABLET WITH DINNER 11/28/18  Yes Koberlein, Junell C, MD  metoprolol tartrate  (LOPRESSOR) 25 MG tablet Take 1 tablet (25 mg total) by mouth 2 (two) times daily. 09/12/18  Yes Koberlein, Junell C, MD  polyethylene glycol (MIRALAX / GLYCOLAX) packet Take 17 g by mouth daily. Patient taking differently: Take 17 g by mouth daily as needed for moderate constipation.  03/19/17  Yes Elodia Florence., MD  simvastatin (ZOCOR) 20 MG tablet Take 1 tablet (20 mg total) by mouth at bedtime. 09/12/18  Yes Koberlein, Steele Berg, MD  vitamin [name] (ASCORBIC ACID) 500 MG tablet Take 500 mg by mouth daily.    Yes [provider]  warfarin (COUMADIN) 5 MG tablet Take 1/2 or 1 tablet daily as directed by Coumadin clinic Patient taking differently: Take 2.5-5 mg by mouth See admin instructions. Take 1 tablet on Sunday then take 1/2 tablet all the other days 07/16/18  Yes Fay Records, MD  Blood Glucose Monitoring Suppl [facility] VERIO) w/Device KIT 1 each by Does not apply route as directed. 06/07/18   Hannah Macadam, MD  glucose blood (ONETOUCH VERIO) test strip Use as instructed once a day 12/07/18   Koberlein, Steele Berg, MD  OneTouch Delica Lancets 92K MISC USE 1  TO CHECK GLUCOSE ONCE  DAILY 07/02/18   Hannah Macadam, MD    Physical Exam:  Constitutional: Elderly female in NAD, calm, comfortable Vitals:   12/14/18 2116 12/14/18 2354 12/15/18 0238 12/15/18 0332  BP: (!) 141/82 131/61 (!) 151/65   Pulse: 64 74 81   Resp: _0 Temp:  98.1 F (36.7 [name])    TempSrc:  Oral    SpO2: 100% 100% 100%   Weight:    68.7 kg  Height:    _1  (1.702 m)   Eyes: PERRL, lids and conjunctivae normal ENMT: Mucous membranes are moist. Posterior pharynx clear of any exudate or lesions.   Neck: normal, supple, no masses, no thyromegaly Respiratory: clear to auscultation bilaterally, no wheezing, no crackles. Normal respiratory effort. No accessory muscle use.  Cardiovascular: Regular rate and rhythm, no murmurs / rubs / gallops.  Trace lower extremity edema. 2+ pedal pulses. No carotid  bruits.  Abdomen: no tenderness, no masses palpated. No hepatosplenomegaly. Bowel sounds positive.  Musculoskeletal: no clubbing / cyanosis. No joint deformity upper and lower extremities. Good ROM, no contractures. Normal muscle tone.  Skin: no rashes, lesions, ulcers. No induration Neurologic: CN 2-12 grossly intact. Sensation intact, DTR normal. Strength 5/5 in all 4.  Psychiatric: Normal judgment and insight. Alert and oriented x 3. Normal mood.     Labs on Admission: I have personally reviewed following labs and imaging studies  CBC: Recent Labs  Lab 12/14/18 1900  WBC 8.2  HGB 14.0  HCT 46.2*  MCV 93.9  PLT 545   Basic Metabolic Panel: Recent Labs  Lab 12/14/18 1900  NA 141  K 3.9  CL 109  CO2 20*  GLUCOSE 119*  BUN 13  CREATININE 1.14*  CALCIUM 9.7   GFR: Estimated Creatinine Clearance: 40.2 mL/min (A) (by [name]-G formula based on SCr of 1.14 mg/dL (H)). Liver Function Tests: No results for input(s): AST, ALT, ALKPHOS, BILITOT, PROT, ALBUMIN in the last 168 hours. No results for input(s): LIPASE, AMYLASE in the last 168 hours. No results for input(s): AMMONIA in the last 168 hours. Coagulation Profile: Recent Labs  Lab 12/12/18 1122 12/15/18 0430  INR 2.8 2.5*   Cardiac Enzymes: No results for input(s): CKTOTAL, CKMB, CKMBINDEX, TROPONINI in the last 168 hours. BNP (last 3 results) No results for input(s): PROBNP in the last 8760 hours. HbA1C: No results for input(s): HGBA1C in the last 72 hours. CBG: No results for input(s): GLUCAP in the last 168 hours. Lipid Profile: No results for input(s): CHOL, HDL, LDLCALC, TRIG, CHOLHDL, LDLDIRECT in the last 72 hours. Thyroid Function Tests: No results for input(s): TSH, T4TOTAL, FREET4, T3FREE, THYROIDAB in the last 72 hours. Anemia Panel: No results for input(s): VITAMINB12, FOLATE, FERRITIN, TIBC, IRON, RETICCTPCT in the last 72 hours. Urine analysis:    Component Value Date/Time   COLORURINE YELLOW  05/21/2018 0635   APPEARANCEUR HAZY (A) 05/21/2018 0635   LABSPEC 1.020 05/21/2018 0635   PHURINE 5.0 05/21/2018 0635   GLUCOSEU NEGATIVE 05/21/2018 0635   HGBUR NEGATIVE 05/21/2018 0635   BILIRUBINUR NEGATIVE 05/21/2018 [place] 05/21/2018 0635   PROTEINUR NEGATIVE 05/21/2018 0635   UROBILINOGEN 0.2 03/14/2017 1102   NITRITE NEGATIVE 05/21/2018 0635   LEUKOCYTESUR NEGATIVE 05/21/2018 0635   Sepsis Labs: No results found for this or any previous visit (from the past 240 hour(s)).   Radiological Exams on Admission: DG Chest 2 View  Result Date: 12/14/2018 CLINICAL DATA:  Patient states she started having L sided  chest pain when she was looking for the medication and got herself worked up and nervous. Patient states she takes Coumadin daily, when she picked up her prescriptions she was not given it. Pt denies cough, SOB, nor fever. Pt states she is normally no blood thinners. Hx of acute massive pulmonary embolism, diabetes, HTN. EXAM: CHEST - 2 VIEW COMPARISON:  10/30/2018 FINDINGS: Cardiac silhouette is normal in size and configuration. No mediastinal or hilar masses. No evidence of adenopathy. Clear lungs.  No pleural effusion or pneumothorax. Skeletal structures are demineralized but intact. IMPRESSION: No active cardiopulmonary disease. Electronically Signed   By: Lajean Manes M.D.   On: 12/14/2018 19:32    EKG: Independently reviewed. Sinus rhythm 82 bpm with left bundle branch block  Assessment/Plan Chest pain, elevated troponin: Acute.  Patient presents with complaints of chest tightness and right shoulder pain.  Initial high-sensitivity troponin 14, but repeat 32.  EKG otherwise unchanged left bundle branch block.  CTA of the chest did not show any signs of a pulmonary embolus, but did note coronary artery calcifications and aortic atherosclerosis.  Patient just recently had echocardiogram which noted preserved EF on 10/28.  Risk factors include hypertension,  diabetes mellitus, and CAD.   -Admit to a cardiac telemetry bed -Continue to trend troponins -Follow-up lipid panel -N.p.o. after midnight for stress test planned in a.m. -Appreciate cardiology consultative services, will follow for further recommendations  History of PE on chronic anticoagulation: Patient with history of massive pulmonary embolus with subsequent PEA arrest.  Currently on Coumadin with INR therapeutic at 2.5.  -Coumadin per pharmacy   -Will need a prescription sent for Coumadin at discharge  CAD: As seen on CTA imaging. -Continue aspirin  Hypertensive urgency: Blood pressures appear uncontrolled up to 189/113 on admission.  Home medications include amlodipine 2.5 mg daily, lisinopril 40 mg daily, metoprolol 25 mg twice daily, and as needed Lasix. -Increased amlodipine to 5 mg nightly and metoprolol to 50 mg twice daily per cardiology recommendation  -Continue all other home medications  Diastolic dysfunction: Last EF 55 to 60% with grade 1 diastolic dysfunction by echocardiogram from 10/31/2018.  Patient appears to be euvolemic at this time. -Continue to monitor  Diabetes mellitus type 2 (well-controlled): Patient on Metformin for treatment.  Last hemoglobin A1c 6.1 on 09/12/2018.  -Hypoglycemic protocols -Hold Metformin -CBGs before every meal with sensitive SSI  Hyperlipidemia: Total cholesterol 151, HDL 57.2, LDL calculated at 78, and triglycerides 80.   -Continue simvastatin  Hypothyroidism: Last TSH 4.408 on 10/30/2018.  Patient on levothyroxine 25 mcg daily. -Recheck TSH -Continue levothyroxine  Chronic kidney disease stage III a: Patient creatinine appears near baseline at 1.14.  -Continue to monitor  DVT prophylaxis: Coumadin Code Status: Full Family Communication: Discussed plan of care with the patient's daughter over the phone Disposition Plan: Possible discharge home if work-up negative Consults called: Cardiology Admission status:  Inpatient  Norval Morton MD [facility] Pager [phone]   If 7PM-7AM, please contact night-coverage [email or web address] Password [email or web address]  12/15/2018, 7:12 AM

## 2018-12-15 NOTE — Plan of Care (Signed)
78 yo female with hypertension, hyperlipidemia, dm2, h/o PE 2014, on anticoagulation, apparently presents with cp, and 2nd troponin is elevated.    Ekg nsr at 46, lad, LBBB (old),   CTA chest pending ordered by ED,    ED requesting admission for evaluation of chest pain

## 2018-12-16 ENCOUNTER — Inpatient Hospital Stay (HOSPITAL_COMMUNITY): Payer: Medicare Other

## 2018-12-16 DIAGNOSIS — R072 Precordial pain: Secondary | ICD-10-CM

## 2018-12-16 DIAGNOSIS — R079 Chest pain, unspecified: Secondary | ICD-10-CM

## 2018-12-16 LAB — BASIC METABOLIC PANEL
Anion gap: 11 (ref 5–15)
BUN: 22 mg/dL (ref 8–23)
CO2: 21 mmol/L — ABNORMAL LOW (ref 22–32)
Calcium: 9.4 mg/dL (ref 8.9–10.3)
Chloride: 108 mmol/L (ref 98–111)
Creatinine, Ser: 1.12 mg/dL — ABNORMAL HIGH (ref 0.44–1.00)
GFR calc Af Amer: 55 mL/min — ABNORMAL LOW (ref >60)
GFR calc non Af Amer: 47 mL/min — ABNORMAL LOW (ref >60)
Glucose, Bld: 92 mg/dL (ref 70–99)
Potassium: 5 mmol/L (ref 3.5–5.1)
Sodium: 140 mmol/L (ref 135–145)

## 2018-12-16 LAB — PROTIME-INR
INR: 2 — ABNORMAL HIGH (ref 0.8–1.2)
Prothrombin Time: 22.4 seconds — ABNORMAL HIGH (ref 11.4–15.2)

## 2018-12-16 LAB — NM MYOCAR MULTI W/SPECT W/WALL MOTION / EF
Exercise duration (min): 1 min
Exercise duration (sec): 0 s
LV dias vol: 48 mL (ref 46–106)
LV sys vol: 21 mL
Peak HR: 103 {beats}/min
RATE: 0.23
Rest HR: 69 {beats}/min
SSS: 8
TID: 0.92

## 2018-12-16 LAB — GLUCOSE, CAPILLARY
Glucose-Capillary: 109 mg/dL — ABNORMAL HIGH (ref 70–99)
Glucose-Capillary: 121 mg/dL — ABNORMAL HIGH (ref 70–99)
Glucose-Capillary: 117 mg/dL — ABNORMAL HIGH (ref 70–99)

## 2018-12-16 LAB — CBC WITH DIFFERENTIAL/PLATELET
Abs Immature Granulocytes: 0.02 10*3/uL (ref 0.00–0.07)
Basophils Absolute: 0 10*3/uL (ref 0.0–0.1)
Basophils Relative: 0 %
Eosinophils Absolute: 0.2 10*3/uL (ref 0.0–0.5)
Eosinophils Relative: 3 %
HCT: 43 % (ref 36.0–46.0)
Hemoglobin: 13.4 g/dL (ref 12.0–15.0)
Immature Granulocytes: 0 %
Lymphocytes Relative: 37 %
Lymphs Abs: 3 10*3/uL (ref 0.7–4.0)
MCH: 28.7 pg (ref 26.0–34.0)
MCHC: 31.2 g/dL (ref 30.0–36.0)
MCV: 92.1 fL (ref 80.0–100.0)
Monocytes Absolute: 0.4 10*3/uL (ref 0.1–1.0)
Monocytes Relative: 5 %
Neutro Abs: 4.4 10*3/uL (ref 1.7–7.7)
Neutrophils Relative %: 55 %
Platelets: 227 10*3/uL (ref 150–400)
RBC: 4.67 MIL/uL (ref 3.87–5.11)
RDW: 15.3 % (ref 11.5–15.5)
WBC: 8 10*3/uL (ref 4.0–10.5)
nRBC: 0 % (ref 0.0–0.2)

## 2018-12-16 MED ORDER — REGADENOSON 0.4 MG/5ML IV SOLN
INTRAVENOUS | Status: AC
  Filled 2018-12-16: qty 5

## 2018-12-16 MED ORDER — WARFARIN SODIUM 10 MG PO TABS
10.0000 mg | ORAL_TABLET | Freq: Once | ORAL | Status: AC
  Administered 2018-12-16: 10 mg via ORAL
  Filled 2018-12-16: qty 1

## 2018-12-16 MED ORDER — TECHNETIUM TC 99M TETROFOSMIN IV KIT
10.0000 | PACK | Freq: Once | INTRAVENOUS | Status: AC | PRN
  Administered 2018-12-16: 10 via INTRAVENOUS

## 2018-12-16 MED ORDER — REGADENOSON 0.4 MG/5ML IV SOLN
0.4000 mg | Freq: Once | INTRAVENOUS | Status: AC
  Administered 2018-12-16: 0.4 mg via INTRAVENOUS
  Filled 2018-12-16: qty 5

## 2018-12-16 MED ORDER — AMLODIPINE BESYLATE 5 MG PO TABS: 5.0000 mg | ORAL_TABLET | Freq: Every day | ORAL | 0 refills | Status: DC

## 2018-12-16 MED ORDER — WARFARIN SODIUM 5 MG PO TABS: ORAL_TABLET | ORAL | 0 refills | Status: DC

## 2018-12-16 MED ORDER — METOPROLOL TARTRATE 50 MG PO TABS: 50.0000 mg | ORAL_TABLET | Freq: Two times a day (BID) | ORAL | 0 refills | Status: DC

## 2018-12-16 NOTE — Discharge Summary (Signed)
Physician Discharge Summary  Kanchan Gal XBL:[id number] DOB: [birth year] DOA: 12/14/2018  PCP: [name], MD  Admit date: 12/14/2018 Discharge date: 12/16/2018  Admitted From: Inpatient Disposition: home  Recommendations for Outpatient Follow-up:  1. Follow up with PCP in 1-2 weeks 2. Please obtain BMP/CBC in one week 3. Please follow up on the following pending results:  Home Health:No Equipment/Devices:NO NEW EQUIPEMNT  Discharge Condition:Stable CODE STATUS:Full code Diet recommendation: Diabetic diet  Brief/Interim Summary: Hannah [name] is a 78 y.o. female with medical history significant of hypertension, hyperlipidemia, diabetes mellitus type 2, history of PE with PEA arrest in 2014, and arthritis.  She presents with complaints of chest tightness started yesterday after the occupational therapist that just left.  Associated symptoms include pain underneath her right armpit.  Denies any significant fever, chills, nausea, vomiting, abdominal pain, or lightheadedness.  Reported having similar pain once before that went away on its own.  Patient thought symptoms could be related to arthritis, but did not want to dismiss them due to history of having a pulmonary embolus.  Of note she had also sent in a refill for her Coumadin yesterday, but when her daughter went to pick up the prescriptions refill for Coumadin was never sent in to the pharmacy.  She reports strict compliance with Coumadin regiment, but missed yesterday's dose.  HOSPITAL COURSE Chest pain.  Patient presented to the ER with reports of chest pain.  She was admitted to a cardiac telemetry bed troponins were trended.  She was made n.p.o. after midnight she underwent a stress test.  She was seen by cardiology also.  Results stress test are pending.  If negative anticipate discharge home.  History of PE on chronic anticoagulation patient had history of massive pulmonary embolus with subsequent PEA arrest she is on  Coumadin with INR therapeutic at 2.5.  Should continue with Coumadin on discharge.  Patient also continue on her home medications for blood pressure with adjustments as noted  Discharge Diagnoses:  Principal Problem:   Chest pain Active Problems:   DM2 (diabetes mellitus, type 2) ([place])   Hyperlipidemia   Hypothyroid   Hypertensive urgency   Diabetes mellitus with neurological manifestations, controlled ([place])   History of pulmonary embolism   Chronic kidney disease, stage 3a   Elevated troponin    Discharge Instructions  Discharge Instructions    Call MD for:  difficulty breathing, headache or visual disturbances   Complete by: As directed    Call MD for:  extreme fatigue   Complete by: As directed    Call MD for:  hives   Complete by: As directed    Call MD for:  persistant dizziness or light-headedness   Complete by: As directed    Call MD for:  persistant nausea and vomiting   Complete by: As directed    Call MD for:  severe uncontrolled pain   Complete by: As directed    Call MD for:  temperature >100.4   Complete by: As directed    Diet - low sodium heart healthy   Complete by: As directed    Increase activity slowly   Complete by: As directed      Allergies as of 12/16/2018      Reactions   Augmentin [amoxicillin-pot Clavulanate] Itching, Other (See Comments)   Severe vaginal itching   Tranxene [clorazepate] Itching   Penicillins Itching, Rash   Has patient had a PCN reaction causing immediate rash, facial/tongue/throat swelling, SOB or lightheadedness with hypotension: Yes Has  patient had a PCN reaction causing severe rash involving mucus membranes or skin necrosis: No Has patient had a PCN reaction that required hospitalization: No Has patient had a PCN reaction occurring within the last 10 years: No If all of the above answers are "NO", then may proceed with Cephalosporin use.      Medication List    TAKE these medications   acetaminophen 500 MG  tablet Commonly known as: TYLENOL Take 500 mg by mouth every 6 (six) hours as needed for mild pain.   amLODipine 5 MG tablet Commonly known as: NORVASC Take 1 tablet (5 mg total) by mouth at bedtime. What changed:   medication strength  how much to take   furosemide 20 MG tablet Commonly known as: LASIX Take 1 tablet (20 mg total) by mouth daily as needed for fluid or edema.   levothyroxine 25 MCG tablet Commonly known as: SYNTHROID TAKE 1 TABLET BY MOUTH ONCE DAILY BEFORE BREAKFAST What changed:   how much to take  how to take this  when to take this  additional instructions   lisinopril 40 MG tablet Commonly known as: ZESTRIL Take 1 tablet (40 mg total) by mouth daily.   metFORMIN 1000 MG tablet Commonly known as: GLUCOPHAGE Take 0.5 tablets (500 mg total) by mouth 2 (two) times daily with a meal. TAKE ONE TABLET BY MOUTH WITH BREAKFAST AND TAKE ONE TABLET WITH DINNER   metoprolol tartrate 25 MG tablet Commonly known as: LOPRESSOR Take 1 tablet (25 mg total) by mouth 2 (two) times daily.   OneTouch Delica Lancets 24Q Misc USE 1  TO CHECK GLUCOSE ONCE DAILY   OneTouch Verio test strip Generic drug: glucose blood Use as instructed once a day   OneTouch Verio w/Device Kit 1 each by Does not apply route as directed.   polyethylene glycol 17 g packet Commonly known as: MIRALAX / GLYCOLAX Take 17 g by mouth daily. What changed:   when to take this  reasons to take this   simvastatin 20 MG tablet Commonly known as: ZOCOR Take 1 tablet (20 mg total) by mouth at bedtime.   VITAMIN B-12 PO Take 1 tablet by mouth daily.   vitamin C 500 MG tablet Commonly known as: ASCORBIC ACID Take 500 mg by mouth daily.   warfarin 5 MG tablet Commonly known as: COUMADIN Take as directed. If you are unsure how to take this medication, talk to your nurse or doctor. Original instructions: Take 1/2 or 1 tablet daily as directed by Coumadin clinic What changed:   how  much to take  how to take this  when to take this  additional instructions       Allergies  Allergen Reactions  . Augmentin [Amoxicillin-Pot Clavulanate] Itching and Other (See Comments)    Severe vaginal itching  . Tranxene [Clorazepate] Itching  . Penicillins Itching and Rash    Has patient had a PCN reaction causing immediate rash, facial/tongue/throat swelling, SOB or lightheadedness with hypotension: Yes Has patient had a PCN reaction causing severe rash involving mucus membranes or skin necrosis: No Has patient had a PCN reaction that required hospitalization: No Has patient had a PCN reaction occurring within the last 10 years: No If all of the above answers are "NO", then may proceed with Cephalosporin use.     Consultations:  CARDS   Procedures/Studies: DG Chest 2 View  Result Date: 12/14/2018 CLINICAL DATA:  Patient states she started having L sided chest pain when she was looking  for the medication and got herself worked up and nervous. Patient states she takes Coumadin daily, when she picked up her prescriptions she was not given it. Pt denies cough, SOB, nor fever. Pt states she is normally no blood thinners. Hx of acute massive pulmonary embolism, diabetes, HTN. EXAM: CHEST - 2 VIEW COMPARISON:  10/30/2018 FINDINGS: Cardiac silhouette is normal in size and configuration. No mediastinal or hilar masses. No evidence of adenopathy. Clear lungs.  No pleural effusion or pneumothorax. Skeletal structures are demineralized but intact. IMPRESSION: No active cardiopulmonary disease. Electronically Signed   By: Lajean Manes M.D.   On: 12/14/2018 19:32   CT Angio Chest PE W and/or Wo Contrast  Result Date: 12/15/2018 CLINICAL DATA:  Chest pain.  Concern for pulmonary embolism EXAM: CT ANGIOGRAPHY CHEST WITH CONTRAST TECHNIQUE: Multidetector CT imaging of the chest was performed using the standard protocol during bolus administration of intravenous contrast. Multiplanar CT  image reconstructions and MIPs were obtained to evaluate the vascular anatomy. CONTRAST:  48m OMNIPAQUE IOHEXOL 350 MG/ML SOLN COMPARISON:  None. FINDINGS: Cardiovascular: No filling defects within the pulmonary arteries to suggest acute pulmonary embolism. No acute findings of the aorta or great vessels. No pericardial fluid. Coronary artery calcification and aortic atherosclerotic calcification. Mediastinum/Nodes: No axillary supraclavicular adenopathy. No mediastinal hilar adenopathy no pericardial effusion Lungs/Pleura: No pulmonary infarction. Mild branching ground-glass densities in the LEFT upper lobe (image 45-56 of series 6). Findings are not changed from 1 year prior consistent benign chronic inflammation. Upper Abdomen: Limited view of the liver, kidneys, pancreas are unremarkable. Normal adrenal glands. Musculoskeletal: No aggressive osseous lesion Review of the MIP images confirms the above findings. IMPRESSION: 1. No evidence acute pulmonary embolism. 2. No acute pulmonary parenchymal findings. 3. Coronary artery calcification and Aortic Atherosclerosis (ICD10-I70.0). Electronically Signed   By: SSuzy BouchardM.D.   On: 12/15/2018 07:51       Subjective: DOING WELL, NO CHEST PAIN  Discharge Exam: Vitals:   12/16/18 1017 12/16/18 1222  BP: 120/61 (!) 143/81  Pulse:  75  Resp:  18  Temp:  97.6 F (36.4 C)  SpO2:  100%   Vitals:   12/16/18 1014 12/16/18 1015 12/16/18 1017 12/16/18 1222  BP: (!) 138/58 (!) 114/58 120/61 (!) 143/81  Pulse:    75  Resp:    18  Temp:    97.6 F (36.4 C)  TempSrc:    Oral  SpO2:    100%  Weight:      Height:        General: Pt is alert, awake, not in acute distress Cardiovascular: RRR, S1/S2 +, no rubs, no gallops Respiratory: CTA bilaterally, no wheezing, no rhonchi Abdominal: Soft, NT, ND, bowel sounds + Extremities: no edema, no cyanosis    The results of significant diagnostics from this hospitalization (including imaging,  microbiology, ancillary and laboratory) are listed below for reference.     Microbiology: Recent Results (from the past 240 hour(s))  SARS CORONAVIRUS 2 (TAT 6-24 HRS) Nasopharyngeal Nasopharyngeal Swab     Status: None   Collection Time: 12/15/18  3:32 AM   Specimen: Nasopharyngeal Swab  Result Value Ref Range Status   SARS Coronavirus 2 NEGATIVE NEGATIVE Final    Comment: (NOTE) SARS-CoV-2 target nucleic acids are NOT DETECTED. The SARS-CoV-2 RNA is generally detectable in upper and lower respiratory specimens during the acute phase of infection. Negative results do not preclude SARS-CoV-2 infection, do not rule out co-infections with other pathogens, and should not be used  as the sole basis for treatment or other patient management decisions. Negative results must be combined with clinical observations, patient history, and epidemiological information. The expected result is Negative. Fact Sheet for Patients: SugarRoll.be Fact Sheet for Healthcare Providers: https://www.woods-mathews.com/ This test is not yet approved or cleared by the [place] FDA and  has been authorized for detection and/or diagnosis of SARS-CoV-2 by FDA under an Emergency Use Authorization (EUA). This EUA will remain  in effect (meaning this test can be used) for the duration of the COVID-19 declaration under Section 56 4(b)(1) of the Act, 21 U.S.C. section 360bbb-3(b)(1), unless the authorization is terminated or revoked sooner. Performed at [facility], [place] [address], [place], [place] [address]      Labs: BNP (last 3 results) Recent Labs    10/30/18 1850  BNP 121.9*   Basic Metabolic Panel: Recent Labs  Lab 12/14/18 1900 12/16/18 0705  NA 141 140  K 3.9 5.0  CL 109 108  CO2 20* 21*  GLUCOSE 119* 92  BUN 13 22  CREATININE 1.14* 1.12*  CALCIUM 9.7 9.4   Liver Function Tests: No results for input(s): AST, ALT, ALKPHOS, BILITOT, PROT,  ALBUMIN in the last 168 hours. No results for input(s): LIPASE, AMYLASE in the last 168 hours. No results for input(s): AMMONIA in the last 168 hours. CBC: Recent Labs  Lab 12/14/18 1900 12/16/18 0705  WBC 8.2 8.0  NEUTROABS  --  4.4  HGB 14.0 13.4  HCT 46.2* 43.0  MCV 93.9 92.1  PLT 244 227   Cardiac Enzymes: No results for input(s): CKTOTAL, CKMB, CKMBINDEX, TROPONINI in the last 168 hours. BNP: Invalid input(s): POCBNP CBG: Recent Labs  Lab 12/15/18 1204 12/15/18 1546 12/15/18 2207 12/16/18 0616 12/16/18 1238  GLUCAP 157* 109* 111* 109* 121*   D-Dimer No results for input(s): DDIMER in the last 72 hours. Hgb A1c Recent Labs    12/15/18 1039  HGBA1C 6.1*   Lipid Profile Recent Labs    12/15/18 1039  CHOL 174  HDL 76  LDLCALC 81  TRIG 86  CHOLHDL 2.3   Thyroid function studies Recent Labs    12/15/18 1039  TSH 2.052   Anemia work up No results for input(s): VITAMINB12, FOLATE, FERRITIN, TIBC, IRON, RETICCTPCT in the last 72 hours. Urinalysis    Component Value Date/Time   COLORURINE YELLOW 05/21/2018 0635   APPEARANCEUR HAZY (A) 05/21/2018 0635   LABSPEC 1.020 05/21/2018 0635   PHURINE 5.0 05/21/2018 0635   GLUCOSEU NEGATIVE 05/21/2018 0635   HGBUR NEGATIVE 05/21/2018 [place] 05/21/2018 [place] 05/21/2018 0635   PROTEINUR NEGATIVE 05/21/2018 0635   UROBILINOGEN 0.2 03/14/2017 1102   NITRITE NEGATIVE 05/21/2018 0635   LEUKOCYTESUR NEGATIVE 05/21/2018 0635   Sepsis Labs Invalid input(s): PROCALCITONIN,  WBC,  LACTICIDVEN Microbiology Recent Results (from the past 240 hour(s))  SARS CORONAVIRUS 2 (TAT 6-24 HRS) Nasopharyngeal Nasopharyngeal Swab     Status: None   Collection Time: 12/15/18  3:32 AM   Specimen: Nasopharyngeal Swab  Result Value Ref Range Status   SARS Coronavirus 2 NEGATIVE NEGATIVE Final    Comment: (NOTE) SARS-CoV-2 target nucleic acids are NOT DETECTED. The SARS-CoV-2 RNA is  generally detectable in upper and lower respiratory specimens during the acute phase of infection. Negative results do not preclude SARS-CoV-2 infection, do not rule out co-infections with other pathogens, and should not be used as the sole basis for treatment or other patient management decisions. Negative  results must be combined with clinical observations, patient history, and epidemiological information. The expected result is Negative. Fact Sheet for Patients: SugarRoll.be Fact Sheet for Healthcare Providers: https://www.woods-mathews.com/ This test is not yet approved or cleared by the [place] FDA and  has been authorized for detection and/or diagnosis of SARS-CoV-2 by FDA under an Emergency Use Authorization (EUA). This EUA will remain  in effect (meaning this test can be used) for the duration of the COVID-19 declaration under Section 56 4(b)(1) of the Act, 21 U.S.C. section 360bbb-3(b)(1), unless the authorization is terminated or revoked sooner. Performed at [facility], [place] [address], [place], [place] [address]      Time coordinating discharge: Over 30 minutes  SIGNED:   Nicolette Bang, MD  [facility] 12/16/2018, 4:13 PM Pager   If 7PM-7AM, please contact night-coverage [email or web address] Password [email or web address]

## 2018-12-16 NOTE — Plan of Care (Signed)

## 2018-12-16 NOTE — Progress Notes (Signed)
Progress Note  Patient Name: Hannah [name] Date of Encounter: 12/16/2018  Primary Cardiologist: Dietrich Pates, MD   Subjective   Pt seen in the stress lab. No further chest pain. She did not sleep well due to anxiety in anticipation of stress test.   Inpatient Medications    Scheduled Meds: . amLODipine  5 mg Oral QHS  . insulin aspart  0-9 Units Subcutaneous TID WC  . levothyroxine  25 mcg Oral Q0600  . lisinopril  40 mg Oral Daily  . metoprolol tartrate  50 mg Oral BID  . regadenoson  0.4 mg Intravenous Once  . simvastatin  20 mg Oral QHS  . warfarin  10 mg Oral ONCE-1800  . Warfarin - Pharmacist Dosing Inpatient   Does not apply q1800   Continuous Infusions:  PRN Meds: acetaminophen, alum & mag hydroxide-simeth, furosemide, hydrALAZINE, ondansetron (ZOFRAN) IV, polyethylene glycol   Vital Signs    Vitals:   12/16/18 0007 12/16/18 0246 12/16/18 0249 12/16/18 0935  BP: 126/65 124/67  (!) 164/87  Pulse: 72 67    Resp: 18 18    Temp: 98 F (36.7 C) 98 F (36.7 C)    TempSrc: Oral Oral    SpO2: 100% 100%    Weight:   69.1 kg   Height:        Intake/Output Summary (Last 24 hours) at 12/16/2018 0947 Last data filed at 12/16/2018 0419 Gross per 24 hour  Intake 240 ml  Output 400 ml  Net -160 ml   Last 3 Weights 12/16/2018 12/15/2018 12/15/2018  Weight (lbs) 152 lb 4.8 oz 150 lb 8 oz 151 lb 6.4 oz  Weight (kg) 69.083 kg 68.266 kg 68.675 kg      Telemetry    NSR - Personally Reviewed  ECG    No new tracings for review.   Physical Exam   GEN: No acute distress.   Neck: No JVD Cardiac: RRR, no murmurs, rubs, or gallops.  Respiratory: Clear to auscultation bilaterally. GI: Soft, nontender, non-distended  MS: No edema; No deformity. Neuro:  Nonfocal  Psych: Normal affect   Labs    High Sensitivity Troponin:   Recent Labs  Lab 12/14/18 1900 12/14/18 2202 12/15/18 1039  TROPONINIHS 14 32* 8      Chemistry Recent Labs  Lab 12/14/18 1900  NA  141  K 3.9  CL 109  CO2 20*  GLUCOSE 119*  BUN 13  CREATININE 1.14*  CALCIUM 9.7  GFRNONAA 46*  GFRAA 54*  ANIONGAP 12     Hematology Recent Labs  Lab 12/14/18 1900 12/16/18 0705  WBC 8.2 8.0  RBC 4.92 4.67  HGB 14.0 13.4  HCT 46.2* 43.0  MCV 93.9 92.1  MCH 28.5 28.7  MCHC 30.3 31.2  RDW 15.1 15.3  PLT 244 227    BNPNo results for input(s): BNP, PROBNP in the last 168 hours.   DDimer No results for input(s): DDIMER in the last 168 hours.   Radiology    DG Chest 2 View  Result Date: 12/14/2018 CLINICAL DATA:  Patient states she started having L sided chest pain when she was looking for the medication and got herself worked up and nervous. Patient states she takes Coumadin daily, when she picked up her prescriptions she was not given it. Pt denies cough, SOB, nor fever. Pt states she is normally no blood thinners. Hx of acute massive pulmonary embolism, diabetes, HTN. EXAM: CHEST - 2 VIEW COMPARISON:  10/30/2018 FINDINGS: Cardiac silhouette is normal  in size and configuration. No mediastinal or hilar masses. No evidence of adenopathy. Clear lungs.  No pleural effusion or pneumothorax. Skeletal structures are demineralized but intact. IMPRESSION: No active cardiopulmonary disease. Electronically Signed   By: Lajean Manes M.D.   On: 12/14/2018 19:32   CT Angio Chest PE W and/or Wo Contrast  Result Date: 12/15/2018 CLINICAL DATA:  Chest pain.  Concern for pulmonary embolism EXAM: CT ANGIOGRAPHY CHEST WITH CONTRAST TECHNIQUE: Multidetector CT imaging of the chest was performed using the standard protocol during bolus administration of intravenous contrast. Multiplanar CT image reconstructions and MIPs were obtained to evaluate the vascular anatomy. CONTRAST:  21mL OMNIPAQUE IOHEXOL 350 MG/ML SOLN COMPARISON:  None. FINDINGS: Cardiovascular: No filling defects within the pulmonary arteries to suggest acute pulmonary embolism. No acute findings of the aorta or great vessels. No  pericardial fluid. Coronary artery calcification and aortic atherosclerotic calcification. Mediastinum/Nodes: No axillary supraclavicular adenopathy. No mediastinal hilar adenopathy no pericardial effusion Lungs/Pleura: No pulmonary infarction. Mild branching ground-glass densities in the LEFT upper lobe (image 45-56 of series 6). Findings are not changed from 1 year prior consistent benign chronic inflammation. Upper Abdomen: Limited view of the liver, kidneys, pancreas are unremarkable. Normal adrenal glands. Musculoskeletal: No aggressive osseous lesion Review of the MIP images confirms the above findings. IMPRESSION: 1. No evidence acute pulmonary embolism. 2. No acute pulmonary parenchymal findings. 3. Coronary artery calcification and Aortic Atherosclerosis (ICD10-I70.0). Electronically Signed   By: Suzy Bouchard M.D.   On: 12/15/2018 07:51    Cardiac Studies   Echo 10/31/18 IMPRESSIONS   1. Left ventricular ejection fraction, by visual estimation, is 55 to 60%. The left ventricle has normal function. There is no left ventricular hypertrophy. 2. Left ventricular diastolic parameters are consistent with Grade I diastolic dysfunction (impaired relaxation). 3. Global right ventricle has normal systolic function.The right ventricular size is normal. No increase in right ventricular wall thickness. 4. Left atrial size was normal. 5. Right atrial size was normal. 6. Mild mitral annular calcification. 7. The mitral valve is normal in structure. Mild mitral valve regurgitation. No evidence of mitral stenosis. 8. The tricuspid valve is normal in structure. Tricuspid valve regurgitation is mild. 9. The aortic valve is tricuspid. Aortic valve regurgitation is not visualized. No evidence of aortic valve sclerosis or stenosis. 10. The pulmonic valve was normal in structure. Pulmonic valve regurgitation is not visualized. 11. Normal pulmonary artery systolic pressure. 12. The inferior vena  cava is normal in size with greater than 50% respiratory variability, suggesting right atrial pressure of 3 mmHg.   Patient Profile     78 y.o. female with a hx of PE on life long coumadin, DM-2, HTN, HLD, Hypothyroidism, and hx of large bowel stricture with colectomy and colostomy who is being seen for the evaluation of  Chest pain and elevated troponin at the request of Dr. Tamala Julian.  Assessment & Plan    Chest pain -Troponins 14, 32, 8 -No acute EKG changes  -Recent echo with syncope in 10/20 showed LVEF 55-60%, grade 1 DD, no regional WMA's. -coronary artery calcifications noted on chest CT, no prior ischemic eval.  -CVD risk factors include HTN, HLD, DM, Age.  -Currently undergoing lexiscan myoview. -Chest pain free  Hypertension -At home on amlodipine 2.5 mg, lisinopril 40 mg, metoprolol 25 mg BID.  -BP elevated.  -lopressor increased to 50 mg and amlod increased to 5 mg for better BP control  Hyperlipidemia -On simvastatin. LDL has been 70's-80's. Continue current statin.  History of PE on coumadin -Has IVC filter. INR therapeutic.   DM type 2 -Management per IM -Well controlled with A1c 6.1      For questions or updates, please contact [facility] Please consult [email or web address] for contact info under        Signed, Berton Bon, NP  12/16/2018, 9:47 AM

## 2018-12-16 NOTE — Plan of Care (Signed)

## 2018-12-16 NOTE — Progress Notes (Signed)
ANTICOAGULATION CONSULT NOTE - Initial Consult  Pharmacy Consult for warfarin Indication: pulmonary embolus history (2014)  Allergies  Allergen Reactions  . Augmentin [Amoxicillin-Pot Clavulanate] Itching and Other (See Comments)    Severe vaginal itching  . Tranxene [Clorazepate] Itching  . Penicillins Itching and Rash    Has patient had a PCN reaction causing immediate rash, facial/tongue/throat swelling, SOB or lightheadedness with hypotension: Yes Has patient had a PCN reaction causing severe rash involving mucus membranes or skin necrosis: No Has patient had a PCN reaction that required hospitalization: No Has patient had a PCN reaction occurring within the last 10 years: No If all of the above answers are "NO", then may proceed with Cephalosporin use.     Patient Measurements: Height: 5' 7" (170.2 cm) Weight: 152 lb 4.8 oz (69.1 kg) IBW/kg (Calculated) : 61.6  Vital Signs: Temp: 98 F (36.7 C) (12/13 0246) Temp Source: Oral (12/13 0246) BP: 124/67 (12/13 0246) Pulse Rate: 67 (12/13 0246)  Labs: Recent Labs    12/14/18 1900 12/14/18 2202 12/15/18 0430 12/15/18 1039 12/16/18 0705  HGB 14.0  --   --   --  13.4  HCT 46.2*  --   --   --  43.0  PLT 244  --   --   --  227  LABPROT  --   --  26.8*  --  22.4*  INR  --   --  2.5*  --  2.0*  CREATININE 1.14*  --   --   --   --   TROPONINIHS 14 32*  --  8  --     Estimated Creatinine Clearance: 40.2 mL/min (A) (by C-G formula based on SCr of 1.14 mg/dL (H)).   Medical History: Past Medical History:  Diagnosis Date  . Acute massive pulmonary embolism ([facility]) 07/13/2012   Massive PE w/ PEA arrest 07/13/12 >TNK >IVC filter >discharged on comadin    . Diabetes mellitus, type 2 ([place]) 07/15/2012   with peripheral neuropathy  . Hyperlipemia 07/14/2012  . Hypertension 07/14/2012  . Large bowel stricture ([facility])    s/p colectomy in 2016  . Osteoarthritis    s/p hip and knee replacements  . Syncope 10/2018  . Thyroid disease      Medications:  Medications Prior to Admission  Medication Sig Dispense Refill Last Dose  . acetaminophen (TYLENOL) 500 MG tablet Take 500 mg by mouth every 6 (six) hours as needed for mild pain.    Past Month at Unknown time  . amLODipine (NORVASC) 2.5 MG tablet Take 1 tablet (2.5 mg total) by mouth at bedtime. 30 tablet 2 12/13/2018 at Unknown time  . Cyanocobalamin (VITAMIN B-12 PO) Take 1 tablet by mouth daily.   12/14/2018 at Unknown time  . furosemide (LASIX) 20 MG tablet Take 1 tablet (20 mg total) by mouth daily as needed for fluid or edema. 30 tablet 3 Past Month at Unknown time  . levothyroxine (SYNTHROID) 25 MCG tablet TAKE 1 TABLET BY MOUTH ONCE DAILY BEFORE BREAKFAST (Patient taking differently: Take 25 mcg by mouth daily before breakfast. ) 90 tablet 1 12/14/2018 at Unknown time  . lisinopril (ZESTRIL) 40 MG tablet Take 1 tablet (40 mg total) by mouth daily. 90 tablet 1 12/14/2018 at Unknown time  . metFORMIN (GLUCOPHAGE) 1000 MG tablet Take 0.5 tablets (500 mg total) by mouth 2 (two) times daily with a meal. TAKE ONE TABLET BY MOUTH WITH BREAKFAST AND TAKE ONE TABLET WITH DINNER 180 tablet 1 12/14/2018 at Unknown time  .  metoprolol tartrate (LOPRESSOR) 25 MG tablet Take 1 tablet (25 mg total) by mouth 2 (two) times daily. 180 tablet 1 12/14/2018 at 0800  . polyethylene glycol (MIRALAX / GLYCOLAX) packet Take 17 g by mouth daily. (Patient taking differently: Take 17 g by mouth daily as needed for moderate constipation. ) 14 each 0 Past Month at Unknown time  . simvastatin (ZOCOR) 20 MG tablet Take 1 tablet (20 mg total) by mouth at bedtime. 90 tablet 1 12/13/2018 at Unknown time  . vitamin C (ASCORBIC ACID) 500 MG tablet Take 500 mg by mouth daily.    12/14/2018 at Unknown time  . warfarin (COUMADIN) 5 MG tablet Take 1/2 or 1 tablet daily as directed by Coumadin clinic (Patient taking differently: Take 2.5-5 mg by mouth See admin instructions. Take 1 tablet on Sunday then take 1/2  tablet all the other days) 30 tablet 2 12/13/2018 at 1800  . Blood Glucose Monitoring Suppl (ONETOUCH VERIO) w/Device KIT 1 each by Does not apply route as directed. 1 kit 0   . glucose blood (ONETOUCH VERIO) test strip Use as instructed once a day 100 each 5   . OneTouch Delica Lancets 33A MISC USE 1  TO CHECK GLUCOSE ONCE DAILY 100 each 3     Assessment: Patient is a 72 yof on warfarin PTA for history of PE in 2014, presenting with chest pain. 12/12 CT negative for PE. INR on admission is therapeutic at 2.5 with patient reported last dose of warfarin on 12/10.   PTA warfarin dose per OP coumadin clinic visit on 12/9: 2.5 mg every Monday and Friday, 5 mg all other days   INR today is subtherapeutic at 2.0. her H&H is stable at 13.4/4., plts wnl. MD messaged to assess if heparin or enoxaparin should be started due to sub-therapeutic INR, will hold initiating at this time and follow up tomorrow.    Goal of Therapy:  INR 2.5-3.5 Monitor platelets by anticoagulation protocol: Yes    Plan:  Warfarin 10 mg x 1 tonight  Follow daily INR, CBC, and monitor for signs of bleeding    Thank you,   Eddie Candle, PharmD PGY-1 Pharmacy Resident   Please check amion for clinical pharmacist contact number 12/16/2018,8:02 AM

## 2018-12-16 NOTE — Progress Notes (Signed)
    Patient presented for Lexiscan nuclear stress test. Tolerated procedure well. Pending final stress imaging result.  Daune Perch, AGNP-C 12/16/2018  10:02 AM Pager: [phone]

## 2018-12-17 DIAGNOSIS — Z7901 Long term (current) use of anticoagulants: Secondary | ICD-10-CM | POA: Diagnosis not present

## 2018-12-17 DIAGNOSIS — Z7984 Long term (current) use of oral hypoglycemic drugs: Secondary | ICD-10-CM | POA: Diagnosis not present

## 2018-12-17 DIAGNOSIS — Z9181 History of falling: Secondary | ICD-10-CM | POA: Diagnosis not present

## 2018-12-17 DIAGNOSIS — E785 Hyperlipidemia, unspecified: Secondary | ICD-10-CM | POA: Diagnosis not present

## 2018-12-17 DIAGNOSIS — E039 Hypothyroidism, unspecified: Secondary | ICD-10-CM | POA: Diagnosis not present

## 2018-12-17 DIAGNOSIS — I272 Pulmonary hypertension, unspecified: Secondary | ICD-10-CM | POA: Diagnosis not present

## 2018-12-17 DIAGNOSIS — E119 Type 2 diabetes mellitus without complications: Secondary | ICD-10-CM | POA: Diagnosis not present

## 2018-12-17 DIAGNOSIS — I5032 Chronic diastolic (congestive) heart failure: Secondary | ICD-10-CM | POA: Diagnosis not present

## 2018-12-17 DIAGNOSIS — I110 Hypertensive heart disease with heart failure: Secondary | ICD-10-CM | POA: Diagnosis not present

## 2018-12-17 DIAGNOSIS — R55 Syncope and collapse: Secondary | ICD-10-CM | POA: Diagnosis not present

## 2018-12-17 DIAGNOSIS — Z86711 Personal history of pulmonary embolism: Secondary | ICD-10-CM | POA: Diagnosis not present

## 2018-12-18 ENCOUNTER — Telehealth: Payer: Self-pay

## 2018-12-18 NOTE — Telephone Encounter (Signed)
Transition Care Management Follow-up Telephone Call  Date of discharge and from where: 12/16/2018 from [place]  How have you been since you were released from the hospital? "She's doing alright". She has not complained of chest pain or shortness of breath since home from the hospital.  Any questions or concerns? No   Items Reviewed:  Did the pt receive and understand the discharge instructions provided? Yes   Medications obtained and verified? Yes   Any new allergies since your discharge? No   Dietary orders reviewed? Yes  Do you have support at home? Yes   Other (ie: DME, Home Health, etc) yes; therapy came on 12/17/2018  Functional Questionnaire: (I = Independent and D = Dependent) ADL's: Patient is staying by herself since home from the hospital and is able to take care of herself but daughter does check in with her and will help her if needed.   Bathing/Dressing- independent    Meal Prep- independent  Eating- independent  Maintaining continence- independent  Transferring/Ambulation- independent  Managing Meds- independent   Follow up appointments reviewed:    PCP Hospital f/u appt confirmed? Yes  Scheduled to see Dr. Ethlyn Gallery 12/24/2018 10:30am.  [facility] f/u appt confirmed? Yes; she has a coumadin clinic appointment 01/10/2019.  Are transportation arrangements needed? No   If their condition worsens, is the pt aware to call  their PCP or go to the ED? Yes  Was the patient provided with contact information for the PCP's office or ED? Yes  Was the pt encouraged to call back with questions or concerns? Yes

## 2018-12-20 ENCOUNTER — Telehealth: Payer: Self-pay | Admitting: Family Medicine

## 2018-12-20 NOTE — Telephone Encounter (Signed)
Message Routed to PCP CMA 

## 2018-12-20 NOTE — Telephone Encounter (Signed)
Copied from [facility] [id number]. Topic: Quick Communication - Home Health Verbal Orders >> Dec 20, 2018  4:32 PM Yvette Rack wrote: Caller/Agency: Sumner Boast with [facility] Callback Number: [phone] Requesting OT/PT/Skilled Nursing/Social Work/Speech Therapy: skilled nursing Frequency: 2 week 2, 1 week 1, 2 as needed

## 2018-12-21 DIAGNOSIS — I272 Pulmonary hypertension, unspecified: Secondary | ICD-10-CM | POA: Diagnosis not present

## 2018-12-21 DIAGNOSIS — E119 Type 2 diabetes mellitus without complications: Secondary | ICD-10-CM | POA: Diagnosis not present

## 2018-12-21 DIAGNOSIS — Z86711 Personal history of pulmonary embolism: Secondary | ICD-10-CM | POA: Diagnosis not present

## 2018-12-21 DIAGNOSIS — I5032 Chronic diastolic (congestive) heart failure: Secondary | ICD-10-CM | POA: Diagnosis not present

## 2018-12-21 DIAGNOSIS — E039 Hypothyroidism, unspecified: Secondary | ICD-10-CM | POA: Diagnosis not present

## 2018-12-21 DIAGNOSIS — Z9181 History of falling: Secondary | ICD-10-CM | POA: Diagnosis not present

## 2018-12-21 DIAGNOSIS — Z7984 Long term (current) use of oral hypoglycemic drugs: Secondary | ICD-10-CM | POA: Diagnosis not present

## 2018-12-21 DIAGNOSIS — E785 Hyperlipidemia, unspecified: Secondary | ICD-10-CM | POA: Diagnosis not present

## 2018-12-21 DIAGNOSIS — R55 Syncope and collapse: Secondary | ICD-10-CM | POA: Diagnosis not present

## 2018-12-21 DIAGNOSIS — Z7901 Long term (current) use of anticoagulants: Secondary | ICD-10-CM | POA: Diagnosis not present

## 2018-12-21 DIAGNOSIS — I110 Hypertensive heart disease with heart failure: Secondary | ICD-10-CM | POA: Diagnosis not present

## 2018-12-21 NOTE — Telephone Encounter (Signed)
ok 

## 2018-12-21 NOTE — Telephone Encounter (Signed)
Left a detailed message on Hannah [name]'s voicemail with the verbal order approval as below.

## 2018-12-24 ENCOUNTER — Inpatient Hospital Stay: Payer: Medicare Other | Admitting: Family Medicine

## 2018-12-24 NOTE — Progress Notes (Deleted)
Virtual Visit via Video Note  I connected with Hannah [name]  on 12/24/18 at 10:30 AM EST by a video enabled telemedicine application and verified that I am speaking with the correct person using two identifiers.  Location patient: home Location provider:work or home office Persons participating in the virtual visit: patient, provider  I discussed the limitations of evaluation and management by telemedicine and the availability of in person appointments. The patient expressed understanding and agreed to proceed.   Hannah [name] DOB: 1940-03-08 Encounter date: 12/24/2018  This is a 78 y.o. female who presents with No chief complaint on file.   History of present illness: Hospital follow up: needs BMP/CBC   Admitted 12/14/18 and discharged 12/16/18. Admitted with chest tightness and pain underneath right armpit.  Echo: mild Gre[place] 1 diastolic dysfunction; TG regurg mild Normal myocardial perfusion study 12/16/18.  Stool studies recently received back (tested due to loose stools) were negative.   Has scheduled appt with cardiology 01/10/19; neurology 01/23/19.   HPI   Allergies  Allergen Reactions  . Augmentin [Amoxicillin-Pot Clavulanate] Itching and Other (See Comments)    Severe vaginal itching  . Tranxene [Clorazepate] Itching  . Penicillins Itching and Rash    Has patient had a PCN reaction causing immediate rash, facial/tongue/throat swelling, SOB or lightheadedness with hypotension: Yes Has patient had a PCN reaction causing severe rash involving mucus membranes or skin necrosis: No Has patient had a PCN reaction that required hospitalization: No Has patient had a PCN reaction occurring within the last 10 years: No If all of the above answers are "NO", then may proceed with Cephalosporin use.    No outpatient medications have been marked as taking for the 12/24/18 encounter (Appointment) with [name], MD.    Review of Systems  Objective:  There were no  vitals taken for this visit.      BP Readings from Last 3 Encounters:  12/16/18 (!) 143/81  11/28/18 (!) 162/80  11/02/18 134/75   Wt Readings from Last 3 Encounters:  12/16/18 152 lb 4.8 oz (69.1 kg)  11/28/18 155 lb 6.4 oz (70.5 kg)  10/30/18 164 lb (74.4 kg)    EXAM:  GENERAL: alert, oriented, appears well and in no acute distress  HEENT: atraumatic, conjunctiva clear, no obvious abnormalities on inspection of external nose and ears  NECK: normal movements of the head and neck  LUNGS: on inspection no signs of respiratory distress, breathing rate appears normal, no obvious gross SOB, gasping or wheezing  CV: no obvious cyanosis  MS: moves all visible extremities without noticeable abnormality  PSYCH/NEURO: pleasant and cooperative, no obvious depression or anxiety, speech and thought processing grossly intact ***  Assessment/Plan  There are no diagnoses linked to this encounter.     I discussed the assessment and treatment plan with the patient. The patient was provided an opportunity to ask questions and all were answered. The patient agreed with the plan and demonstrated an understanding of the instructions.   The patient was advised to call back or seek an in-person evaluation if the symptoms worsen or if the condition fails to improve as anticipated.  I provided *** minutes of non-face-to-face time during this encounter.   Micheline Rough, MD

## 2018-12-25 ENCOUNTER — Telehealth: Payer: Self-pay | Admitting: *Deleted

## 2018-12-25 NOTE — Telephone Encounter (Signed)
Katie from [facility] called wanting to know they can extend visits for 1 time a week for a couple of weeks. Patient seen in ER recently. Please advise Call back number: [phone]

## 2018-12-25 NOTE — Telephone Encounter (Signed)
Ok; patient missed hospital follow up visit yesterday FYI

## 2018-12-25 NOTE — Telephone Encounter (Signed)
Spoke with Joellen Jersey and informed her of the message below.  Also advised Joellen Jersey we tried contacting the pts daughter a number of times yesterday for the appt and was not able to reach anyone.

## 2018-12-26 ENCOUNTER — Telehealth: Payer: Self-pay | Admitting: *Deleted

## 2018-12-26 NOTE — Telephone Encounter (Signed)
Dr Ethlyn Gallery received a fax from [facility] and stated the stool studies looked good.  She stated the pt needs to follow up with gastroenterology for the bloating issue and it is OK to place the referral if she is not established with a GI doctor.  Left a message for the pts daughter to return my call and CRM also created.

## 2019-01-09 ENCOUNTER — Inpatient Hospital Stay: Payer: Self-pay | Admitting: Family Medicine

## 2019-01-09 ENCOUNTER — Inpatient Hospital Stay: Payer: Medicare Other | Admitting: Family Medicine

## 2019-01-10 ENCOUNTER — Ambulatory Visit: Payer: Medicare Other | Admitting: *Deleted

## 2019-01-10 ENCOUNTER — Other Ambulatory Visit: Payer: Self-pay

## 2019-01-10 DIAGNOSIS — Z5181 Encounter for therapeutic drug level monitoring: Secondary | ICD-10-CM

## 2019-01-10 DIAGNOSIS — Z7901 Long term (current) use of anticoagulants: Secondary | ICD-10-CM | POA: Diagnosis not present

## 2019-01-10 DIAGNOSIS — I2699 Other pulmonary embolism without acute cor pulmonale: Secondary | ICD-10-CM

## 2019-01-10 LAB — POCT INR: INR: 3.2 — AB (ref 2.0–3.0)

## 2019-01-10 NOTE — Patient Instructions (Addendum)
Description   Continue taking 5mg  (1 peach tablet) daily except 2.5mg  (1/2 peach tablet) on Monday and Friday. Recheck INR in 5 weeks. Call with any new meds or procedures [phone].

## 2019-01-16 ENCOUNTER — Telehealth: Payer: Self-pay | Admitting: *Deleted

## 2019-01-16 NOTE — Telephone Encounter (Signed)
Spoke with Hannah [name] and informed her of the message below.  Appt scheduled for 1/20 to arrive at 1:45pm.

## 2019-01-16 NOTE — Telephone Encounter (Signed)
Ok to schedule this. We weren't able to contact them for that appointment at end of December that she had for hospital follow up and then I was out due to covid testing at her next visit. I would be ok with putting her in virtual spot if you cannot find opening.

## 2019-01-16 NOTE — Telephone Encounter (Signed)
Copied from CRM [id number]. Topic: General - Other >> Jan 16, 2019 11:43 AM Gwenlyn Fudge wrote: Reason for CRM: Pts daughter called stating the pt was supposed to have a hospital f/u at the beginning of the month. Pts daughter states that the appt was scheduled because the provider did not come into the office. Pts daughter is requesting to have a hospital f/u scheduled in the office for the pt. Please advise.

## 2019-01-19 ENCOUNTER — Other Ambulatory Visit: Payer: Self-pay | Admitting: Student

## 2019-01-19 MED ORDER — WARFARIN SODIUM 5 MG PO TABS: ORAL_TABLET | ORAL | 1 refills | Status: DC

## 2019-01-23 ENCOUNTER — Other Ambulatory Visit: Payer: Self-pay

## 2019-01-23 ENCOUNTER — Encounter: Payer: Self-pay | Admitting: Family Medicine

## 2019-01-23 ENCOUNTER — Telehealth (INDEPENDENT_AMBULATORY_CARE_PROVIDER_SITE_OTHER): Payer: Medicare Other | Admitting: Family Medicine

## 2019-01-23 ENCOUNTER — Ambulatory Visit: Payer: Medicare Other | Admitting: Neurology

## 2019-01-23 ENCOUNTER — Telehealth: Payer: Self-pay | Admitting: *Deleted

## 2019-01-23 VITALS — BP 140/70 | HR 62 | Temp 97.3°F | Ht 67.0 in | Wt 159.8 lb

## 2019-01-23 DIAGNOSIS — R195 Other fecal abnormalities: Secondary | ICD-10-CM

## 2019-01-23 DIAGNOSIS — E038 Other specified hypothyroidism: Secondary | ICD-10-CM | POA: Diagnosis not present

## 2019-01-23 DIAGNOSIS — E1142 Type 2 diabetes mellitus with diabetic polyneuropathy: Secondary | ICD-10-CM

## 2019-01-23 DIAGNOSIS — Z86711 Personal history of pulmonary embolism: Secondary | ICD-10-CM

## 2019-01-23 DIAGNOSIS — R29898 Other symptoms and signs involving the musculoskeletal system: Secondary | ICD-10-CM

## 2019-01-23 DIAGNOSIS — N1831 Chronic kidney disease, stage 3a: Secondary | ICD-10-CM | POA: Diagnosis not present

## 2019-01-23 DIAGNOSIS — E7849 Other hyperlipidemia: Secondary | ICD-10-CM

## 2019-01-23 MED ORDER — METFORMIN HCL ER 500 MG PO TB24: 500.0000 mg | ORAL_TABLET | Freq: Every day | ORAL | 1 refills | Status: DC

## 2019-01-23 NOTE — Telephone Encounter (Signed)
Please see "Lab result scan from 12/11".  All medications were reviewed during check in.  Message sent to PCP.

## 2019-01-23 NOTE — Progress Notes (Signed)
Hannah [name] DOB: December 12, 1940 Encounter date: 01/23/2019  This is a 79 y.o. female who presents with Chief Complaint  Patient presents with  . Hospitalization Follow-up    History of present illness: Missed previous hospital follow-up visit.  Admitted 12/14/2018 through 12/16/2018 from home with concerns of chest pain.  She had a nuclear stress test with normal myocardial perfusion and no evidence of ischemia or infarction as well as normal left ventricular ejection fraction without wall motion abnormality.  Her last in office visit was November 25 for a hospital follow-up due to syncope.  She had an appointment scheduled with neurology today, but that has been canceled.  She has an upcoming appointment in February with cardiology.  No more issues with chest pain. Blood pressures have been "good". This morning was 133/78 HR 67. Has been running around that number. Not run higher either.   Patient and daughter do not have concerns with health. INR levels have been good.   Stools still loose. They have been loose since surgery; was a little thicker previously.   Hypertension: Amlodipine 5 mg, lisinopril 40 mg, metoprolol 50 mg twice daily Diabetes type 2: Metformin 1000 mg twice daily.  Last A1c in December was 6.1. Sugar this morning was 93.  Hypothyroid: Synthroid 25 mcg daily Chronic kidney disease stage III: Kidney function stable on December blood work. Hyperlipidemia: Simvastatin 20 mg daily.  Last lipid panel was in December and was very well controlled.  History of pulmonary embolism on chronic Coumadin therapy   Allergies  Allergen Reactions  . Augmentin [Amoxicillin-Pot Clavulanate] Itching and Other (See Comments)    Severe vaginal itching  . Tranxene [Clorazepate] Itching  . Penicillins Itching and Rash    Has patient had a PCN reaction causing immediate rash, facial/tongue/throat swelling, SOB or lightheadedness with hypotension: Yes Has patient had a PCN reaction  causing severe rash involving mucus membranes or skin necrosis: No Has patient had a PCN reaction that required hospitalization: No Has patient had a PCN reaction occurring within the last 10 years: No If all of the above answers are "NO", then may proceed with Cephalosporin use.    Current Meds  Medication Sig  . acetaminophen (TYLENOL) 500 MG tablet Take 500 mg by mouth every 6 (six) hours as needed for mild pain.   . Blood Glucose Monitoring Suppl (ONETOUCH VERIO) w/Device KIT 1 each by Does not apply route as directed.  . Cyanocobalamin (VITAMIN B-12 PO) Take 1 tablet by mouth daily.  . furosemide (LASIX) 20 MG tablet Take 1 tablet (20 mg total) by mouth daily as needed for fluid or edema.  Marland Kitchen glucose blood (ONETOUCH VERIO) test strip Use as instructed once a day  . levothyroxine (SYNTHROID) 25 MCG tablet TAKE 1 TABLET BY MOUTH ONCE DAILY BEFORE BREAKFAST (Patient taking differently: Take 25 mcg by mouth daily before breakfast. )  . lisinopril (ZESTRIL) 40 MG tablet Take 1 tablet (40 mg total) by mouth daily.  [name] Delica Lancets 16X MISC USE 1  TO CHECK GLUCOSE ONCE DAILY  . polyethylene glycol (MIRALAX / GLYCOLAX) packet Take 17 g by mouth daily. (Patient taking differently: Take 17 g by mouth daily as needed for moderate constipation. )  . simvastatin (ZOCOR) 20 MG tablet Take 1 tablet (20 mg total) by mouth at bedtime.  . vitamin C (ASCORBIC ACID) 500 MG tablet Take 500 mg by mouth daily.   Marland Kitchen warfarin (COUMADIN) 5 MG tablet TAKE 1/2 TO 1 (ONE-HALF TO ONE) TABLET BY  MOUTH DAILY AS DIRECTED BY  COUMADIN  CLINIC  . [DISCONTINUED] metFORMIN (GLUCOPHAGE) 1000 MG tablet Take 0.5 tablets (500 mg total) by mouth 2 (two) times daily with a meal. TAKE ONE TABLET BY MOUTH WITH BREAKFAST AND TAKE ONE TABLET WITH DINNER    Review of Systems  Constitutional: Negative for chills, fatigue and fever.  Respiratory: Negative for cough, chest tightness, shortness of breath and wheezing.    Cardiovascular: Negative for chest pain, palpitations and leg swelling.  Gastrointestinal: Negative for abdominal distention (better), abdominal pain and constipation. Diarrhea: loose stools.  Neurological: Negative for dizziness, weakness, light-headedness and headaches.    Objective:  BP 140/70 (BP Location: Right Arm, Patient Position: Sitting, Cuff Size: Normal)   Pulse 62   Temp (!) 97.3 F (36.3 C) (Temporal)   Ht _0  (1.702 m)   Wt 159 lb 12.8 oz (72.5 kg)   SpO2 98%   BMI 25.03 kg/m   Weight: 159 lb 12.8 oz (72.5 kg)   BP Readings from Last 3 Encounters:  01/23/19 140/70  12/16/18 (!) 143/81  11/28/18 (!) 162/80   Wt Readings from Last 3 Encounters:  01/23/19 159 lb 12.8 oz (72.5 kg)  12/16/18 152 lb 4.8 oz (69.1 kg)  11/28/18 155 lb 6.4 oz (70.5 kg)    Physical Exam Constitutional:      General: She is not in acute distress.    Appearance: She is well-developed.  Cardiovascular:     Rate and Rhythm: Normal rate and regular rhythm.     Heart sounds: Normal heart sounds. No murmur. No friction rub.  Pulmonary:     Effort: Pulmonary effort is normal. No respiratory distress.     Breath sounds: Normal breath sounds. No wheezing or rales.  Musculoskeletal:     Right lower leg: No edema.     Left lower leg: No edema.  Neurological:     Mental Status: She is alert and oriented to person, place, and time.     Comments: Upper extremity strength testing is 5 out of 5 abduction bilateral shoulders, 4+ out of 5 biceps and triceps bilaterally.  Hip flexor strength is 4 out of 5 bilaterally, quads and hamstrings are 4+ out of 5 bilaterally.  Psychiatric:        Behavior: Behavior normal.     Assessment/Plan  1. Loose stools We discussed eliminating dairy for 2 weeks and adding probiotic to see if this helps with stools.  I also noticed after visit that MiraLAX is on her med list, so I will ensure that she is not taking this regularly.  May also be related to  Metformin, so we have made a change to this as well.  2. Weakness of both lower extremities We reviewed lower extremity strengthening exercises she can do at home.  She has been doing some on her own from what she learned with physical therapist, but I specifically discussed hip strengthening exercises with her today that she can complete either with holding onto a counter or in a seated position.  3. Type 2 diabetes mellitus with diabetic polyneuropathy, without long-term current use of insulin (HCC) Blood sugar has been very well controlled.  We are going to change her Metformin to 500 mg extended release to see if this helps with the loose stools.  I do not feel she needs to be on higher dosing since she has been so well controlled with sugars.  4. Other specified hypothyroidism She has been well controlled on current  medication.  We will plan to recheck labs in approximately 4-6 months.  5. Chronic kidney disease, stage 3a Kidney function has been stable.  6. Other hyperlipidemia Lipids have been stable.  Continue Zocor 20 mg daily.  7. History of pulmonary embolism Anticoagulated on Coumadin.  Levels have been stable.    Return in about 3 months (around 04/23/2019) for Chronic condition visit.     Micheline Rough, MD

## 2019-01-23 NOTE — Telephone Encounter (Signed)
-----   Message from Wynn Banker, MD sent at 01/23/2019  4:14 PM EST ----- Do you see results of all stool studies I ordered in chart? I feel like I remember seeing them and I see c diff but not others. Please let me know if you see them and if you can't, can you request additional copy? Thanks!#also; just a thought. I see miralax is on her med list. Since we discussed loose stools I assume she is not taking this, but worth making sure!

## 2019-01-25 NOTE — Telephone Encounter (Signed)
When I open up that scan I only see c diff and not all the other stool studies that were ordered. Are you seeing all the other studies as well as c diff results?

## 2019-01-29 NOTE — Telephone Encounter (Signed)
Results were not received initially. I called [facility] again and spoke with Tamika.  Tamika stated she will send the reports via fax.

## 2019-01-29 NOTE — Telephone Encounter (Signed)
Per Aleea at [facility] the results for the other tests were under a different file and she is faxing the results now.  Results were

## 2019-01-31 NOTE — Telephone Encounter (Signed)
Spoke with [name] the pts daughter and informed her of the message below.  [name] stated so far the patient is OK and she has not had any complaints and the pt is not currently taking any stool softeners or laxatives.  Message sent to PCP.

## 2019-01-31 NOTE — Telephone Encounter (Signed)
Please let patient/daughter know that all stool studies were negative for infection. Please see if any relief with dairy restriction? And make sure she is not taking stool softeners/laxatives currently. If stools/gas production not improved then I would recommend follow up with GI.

## 2019-02-01 ENCOUNTER — Telehealth: Payer: Self-pay | Admitting: Family Medicine

## 2019-02-01 DIAGNOSIS — E1169 Type 2 diabetes mellitus with other specified complication: Secondary | ICD-10-CM

## 2019-02-01 DIAGNOSIS — E785 Hyperlipidemia, unspecified: Secondary | ICD-10-CM

## 2019-02-01 NOTE — Telephone Encounter (Signed)
[facility] is requesting a refill on Levothyroxine 25 mg. Pt is out of medication. Thanks  Psychologist, forensic on [employer]

## 2019-02-01 NOTE — Telephone Encounter (Signed)
Called pharmacy and this has been handled

## 2019-02-04 MED ORDER — SIMVASTATIN 20 MG PO TABS: 20.0000 mg | ORAL_TABLET | Freq: Every day | ORAL | 1 refills | Status: DC

## 2019-02-04 NOTE — Telephone Encounter (Signed)
Patient needs refill on Rx: simvastatin (ZOCOR) 20 MG tablet  Sent to:   [id number] - [place] ([place]), [place] - [address] Phone:  [phone]  Fax:  [phone]

## 2019-02-04 NOTE — Telephone Encounter (Signed)
Rx done. 

## 2019-02-14 ENCOUNTER — Ambulatory Visit: Payer: Medicare Other | Admitting: *Deleted

## 2019-02-14 ENCOUNTER — Other Ambulatory Visit: Payer: Self-pay

## 2019-02-14 DIAGNOSIS — Z5181 Encounter for therapeutic drug level monitoring: Secondary | ICD-10-CM

## 2019-02-14 DIAGNOSIS — Z7901 Long term (current) use of anticoagulants: Secondary | ICD-10-CM

## 2019-02-14 DIAGNOSIS — I2699 Other pulmonary embolism without acute cor pulmonale: Secondary | ICD-10-CM

## 2019-02-14 LAB — POCT INR: INR: 2.9 (ref 2.0–3.0)

## 2019-02-14 NOTE — Patient Instructions (Signed)
Description   Continue taking 5mg  (1 peach tablet) daily except 2.5mg  (1/2 peach tablet) on Monday and Friday. Recheck INR in 6 weeks. Call with any new meds or procedures [phone].

## 2019-03-25 ENCOUNTER — Other Ambulatory Visit: Payer: Self-pay | Admitting: Family Medicine

## 2019-03-25 NOTE — Telephone Encounter (Signed)
Patient called back and read the bottle which she stated was listed as Amlodipine 2.5mg  and this is what she has been taking.  Message sent to PCP.

## 2019-03-25 NOTE — Telephone Encounter (Signed)
Left a message for the pt to return my call.  

## 2019-03-25 NOTE — Telephone Encounter (Signed)
Last visit she was taking norvasc 5mg  daily? Please confirm dose before refills given so we are in agreement about what she is taking. BP was well controlled at last visit (when I believe she was on 5mg  amlodipine). Thanks!

## 2019-03-27 NOTE — Telephone Encounter (Signed)
Thanks for update. Refill sent.

## 2019-03-27 NOTE — Telephone Encounter (Signed)
Noted  

## 2019-03-28 ENCOUNTER — Ambulatory Visit: Payer: Medicare Other | Admitting: *Deleted

## 2019-03-28 ENCOUNTER — Other Ambulatory Visit: Payer: Self-pay

## 2019-03-28 DIAGNOSIS — Z7901 Long term (current) use of anticoagulants: Secondary | ICD-10-CM | POA: Diagnosis not present

## 2019-03-28 DIAGNOSIS — Z5181 Encounter for therapeutic drug level monitoring: Secondary | ICD-10-CM

## 2019-03-28 DIAGNOSIS — I2699 Other pulmonary embolism without acute cor pulmonale: Secondary | ICD-10-CM | POA: Diagnosis not present

## 2019-03-28 LAB — POCT INR: INR: 2.2 (ref 2.0–3.0)

## 2019-03-28 MED ORDER — WARFARIN SODIUM 5 MG PO TABS: ORAL_TABLET | ORAL | 1 refills | Status: DC

## 2019-03-28 NOTE — Patient Instructions (Signed)
Description   Take 1.5 tablets today and then continue taking 5mg  (1 peach tablet) daily except 2.5mg  (1/2 peach tablet) on Monday and Friday. Recheck INR in 4 weeks. Call with any new meds or procedures [phone].

## 2019-04-15 DIAGNOSIS — Z4801 Encounter for change or removal of surgical wound dressing: Secondary | ICD-10-CM | POA: Diagnosis not present

## 2019-04-15 DIAGNOSIS — Z933 Colostomy status: Secondary | ICD-10-CM | POA: Diagnosis not present

## 2019-04-15 DIAGNOSIS — K56609 Unspecified intestinal obstruction, unspecified as to partial versus complete obstruction: Secondary | ICD-10-CM | POA: Diagnosis not present

## 2019-04-15 DIAGNOSIS — E119 Type 2 diabetes mellitus without complications: Secondary | ICD-10-CM | POA: Diagnosis not present

## 2019-04-24 ENCOUNTER — Telehealth (INDEPENDENT_AMBULATORY_CARE_PROVIDER_SITE_OTHER): Payer: Medicare Other | Admitting: Family Medicine

## 2019-04-24 ENCOUNTER — Other Ambulatory Visit: Payer: Self-pay

## 2019-04-24 VITALS — BP 126/74 | HR 86 | Temp 97.8°F | Wt 172.2 lb

## 2019-04-24 DIAGNOSIS — E1142 Type 2 diabetes mellitus with diabetic polyneuropathy: Secondary | ICD-10-CM

## 2019-04-24 DIAGNOSIS — E038 Other specified hypothyroidism: Secondary | ICD-10-CM | POA: Diagnosis not present

## 2019-04-24 DIAGNOSIS — E7849 Other hyperlipidemia: Secondary | ICD-10-CM

## 2019-04-24 DIAGNOSIS — I10 Essential (primary) hypertension: Secondary | ICD-10-CM | POA: Diagnosis not present

## 2019-04-24 LAB — COMPREHENSIVE METABOLIC PANEL
ALT: 13 U/L (ref 0–35)
AST: 20 U/L (ref 0–37)
Albumin: 3.8 g/dL (ref 3.5–5.2)
Alkaline Phosphatase: 66 U/L (ref 39–117)
BUN: 31 mg/dL — ABNORMAL HIGH (ref 6–23)
CO2: 25 mEq/L (ref 19–32)
Calcium: 9.2 mg/dL (ref 8.4–10.5)
Chloride: 105 mEq/L (ref 96–112)
Creatinine, Ser: 1.22 mg/dL — ABNORMAL HIGH (ref 0.40–1.20)
GFR: 51.59 mL/min — ABNORMAL LOW (ref >60.00)
Glucose, Bld: 83 mg/dL (ref 70–99)
Potassium: 4.4 mEq/L (ref 3.5–5.1)
Sodium: 138 mEq/L (ref 135–145)
Total Bilirubin: 0.5 mg/dL (ref 0.2–1.2)
Total Protein: 6.6 g/dL (ref 6.0–8.3)

## 2019-04-24 LAB — HEMOGLOBIN A1C: Hgb A1c MFr Bld: 6.2 % (ref 4.6–6.5)

## 2019-04-24 LAB — TSH: TSH: 1.27 u[IU]/mL (ref 0.35–4.50)

## 2019-04-24 NOTE — Progress Notes (Signed)
Hannah [name] DOB: Mar 07, 1940 Encounter date: 04/24/2019  This is a 79 y.o. female who presents with Chief Complaint  Patient presents with  . Follow-up    pt is here for 3 month follow up     History of present illness: Completed pfizer vaccine. Did ok with this.   HTN: has been checking sometimes at home and has done ok. Bought wrist cuff but feels like she gets these fluctuations but they are all within a normal range.   Appetite is good. Feels like the new synthroid - makes her hungrier. Just wanting to eat all the time.    Feels like she is less gassy.   Blood sugar has been around 119-121. No hypoglycemia.   HL: simvastatin 20mg  nightly.  Completed PT at home. Working on home exercises daily and feels she is getting stronger.   No additional episodes of syncope/near syncope. Really has been feeling well.   Allergies  Allergen Reactions  . Augmentin [Amoxicillin-Pot Clavulanate] Itching and Other (See Comments)    Severe vaginal itching  . Tranxene [Clorazepate] Itching  . Penicillins Itching and Rash    Has patient had a PCN reaction causing immediate rash, facial/tongue/throat swelling, SOB or lightheadedness with hypotension: Yes Has patient had a PCN reaction causing severe rash involving mucus membranes or skin necrosis: No Has patient had a PCN reaction that required hospitalization: No Has patient had a PCN reaction occurring within the last 10 years: No If all of the above answers are "NO", then may proceed with Cephalosporin use.    No outpatient medications have been marked as taking for the 04/24/19 encounter (Telemedicine) with 04/26/19, MD.    Review of Systems  Constitutional: Negative for chills, fatigue and fever.  Respiratory: Negative for cough, chest tightness, shortness of breath and wheezing.   Cardiovascular: Negative for chest pain, palpitations and leg swelling.  Neurological: Positive for tremors (right hand; notes more recently).     Objective:  BP 126/74 (BP Location: Left Arm, Patient Position: Sitting, Cuff Size: Normal)   Pulse 86   Temp 97.8 F (36.6 C) (Temporal)   Wt 172 lb 3.2 oz (78.1 kg)   SpO2 97%   BMI 26.97 kg/m   Weight: 172 lb 3.2 oz (78.1 kg)   BP Readings from Last 3 Encounters:  04/24/19 126/74  01/23/19 140/70  12/16/18 (!) 143/81   Wt Readings from Last 3 Encounters:  04/24/19 172 lb 3.2 oz (78.1 kg)  01/23/19 159 lb 12.8 oz (72.5 kg)  12/16/18 152 lb 4.8 oz (69.1 kg)    Physical Exam Constitutional:      General: She is not in acute distress.    Appearance: She is well-developed. She is not diaphoretic.  HENT:     Head: Normocephalic and atraumatic.     Right Ear: External ear normal.     Left Ear: External ear normal.  Eyes:     Conjunctiva/sclera: Conjunctivae normal.     Pupils: Pupils are equal, round, and reactive to light.  Neck:     Thyroid: No thyromegaly.  Cardiovascular:     Rate and Rhythm: Normal rate and regular rhythm.     Heart sounds: Normal heart sounds. No murmur. No friction rub. No gallop.   Pulmonary:     Effort: Pulmonary effort is normal. No respiratory distress.     Breath sounds: Normal breath sounds. No wheezing or rales.  Musculoskeletal:     Cervical back: Neck supple.  Lymphadenopathy:  Cervical: No cervical adenopathy.  Skin:    General: Skin is warm and dry.  Neurological:     Mental Status: She is alert and oriented to person, place, and time.     Cranial Nerves: No cranial nerve deficit.     Deep Tendon Reflexes: Reflexes normal.     Reflex Scores:      Tricep reflexes are 2+ on the right side and 2+ on the left side.      Bicep reflexes are 2+ on the right side and 2+ on the left side.      Brachioradialis reflexes are 2+ on the right side and 2+ on the left side.      Patellar reflexes are 2+ on the right side and 2+ on the left side.    Comments: Slight resting tremor right hand still noted with movement with intension. If  using hand, though, there is some slight improvement. No noted weakness. No rigidity of joints in hand, wrist, elbow.  Weakness has improved with 5/5 arm strength bilat; 4+/5 hip bilat, quad, hamstring  Using cane for walking  Psychiatric:        Behavior: Behavior normal.     Assessment/Plan  1. Other hyperlipidemia Continue zocor, has been controlled. - Comprehensive metabolic panel; Future - Comprehensive metabolic panel  2. Type 2 diabetes mellitus with diabetic polyneuropathy, without long-term current use of insulin (HCC) Blood sugars have been stable. Continue current medications.  - CBC with Differential/Platelet; Future - Hemoglobin A1c; Future - Hemoglobin A1c - CBC with Differential/Platelet - Fecal leukocytes - Giardia/Cryptosporidium EIA  3. Other specified hypothyroidism Will recheck today due to change in appetite, but has been stable with synthroid (although recent brand change) - TSH; Future - TSH  4. HTN: has been stable; improved from previous. Continue current medication.   Return in about 6 months (around 10/24/2019) for physical exam.     Micheline Rough, MD

## 2019-04-25 ENCOUNTER — Ambulatory Visit: Payer: Medicare Other | Admitting: *Deleted

## 2019-04-25 ENCOUNTER — Other Ambulatory Visit: Payer: Self-pay

## 2019-04-25 DIAGNOSIS — I10 Essential (primary) hypertension: Secondary | ICD-10-CM | POA: Insufficient documentation

## 2019-04-25 DIAGNOSIS — I2699 Other pulmonary embolism without acute cor pulmonale: Secondary | ICD-10-CM | POA: Diagnosis not present

## 2019-04-25 DIAGNOSIS — Z5181 Encounter for therapeutic drug level monitoring: Secondary | ICD-10-CM

## 2019-04-25 DIAGNOSIS — Z7901 Long term (current) use of anticoagulants: Secondary | ICD-10-CM | POA: Diagnosis not present

## 2019-04-25 LAB — CBC WITH DIFFERENTIAL/PLATELET
Basophils Absolute: 0.1 10*3/uL (ref 0.0–0.1)
Basophils Relative: 0.7 % (ref 0.0–3.0)
Eosinophils Absolute: 0.1 10*3/uL (ref 0.0–0.7)
Eosinophils Relative: 1.8 % (ref 0.0–5.0)
HCT: 41.2 % (ref 36.0–46.0)
Hemoglobin: 13.3 g/dL (ref 12.0–15.0)
Lymphocytes Relative: 39.8 % (ref 12.0–46.0)
Lymphs Abs: 3 10*3/uL (ref 0.7–4.0)
MCHC: 32.2 g/dL (ref 30.0–36.0)
MCV: 89 fl (ref 78.0–100.0)
Monocytes Absolute: 0.4 10*3/uL (ref 0.1–1.0)
Monocytes Relative: 5.9 % (ref 3.0–12.0)
Neutro Abs: 3.9 10*3/uL (ref 1.4–7.7)
Neutrophils Relative %: 51.8 % (ref 43.0–77.0)
Platelets: 218 10*3/uL (ref 150.0–400.0)
RBC: 4.63 Mil/uL (ref 3.87–5.11)
RDW: 16.1 % — ABNORMAL HIGH (ref 11.5–15.5)
WBC: 7.6 10*3/uL (ref 4.0–10.5)

## 2019-04-25 LAB — POCT INR: INR: 3.2 — AB (ref 2.0–3.0)

## 2019-04-25 NOTE — Patient Instructions (Signed)
Description   Continue taking 5mg  (1 peach tablet) daily except 2.5mg  (1/2 peach tablet) on Monday and Friday. Recheck INR in 5 weeks. Call with any new meds or procedures [phone].

## 2019-05-14 ENCOUNTER — Telehealth: Payer: Self-pay | Admitting: *Deleted

## 2019-05-14 NOTE — Telephone Encounter (Signed)
[facility] faxed a refill request for Metoprolol tartrate 25mg -take 1 tablet by mouth twice daily-#180.  Message sent to PCP as Rx not listed on pts current med list.

## 2019-05-15 ENCOUNTER — Other Ambulatory Visit: Payer: Self-pay | Admitting: Family Medicine

## 2019-05-15 MED ORDER — METOPROLOL TARTRATE 25 MG PO TABS: 25.0000 mg | ORAL_TABLET | Freq: Two times a day (BID) | ORAL | 1 refills | Status: DC

## 2019-05-15 NOTE — Telephone Encounter (Signed)
Can you please confirm dose of this and that she is taking? Our list has different dose (50mg ).

## 2019-05-15 NOTE — Telephone Encounter (Signed)
Okay. Refill sent.

## 2019-05-15 NOTE — Telephone Encounter (Signed)
Spoke to [employer] Pt's daughter and she stated that pt is taking 25 mg BID

## 2019-05-16 NOTE — Telephone Encounter (Signed)
Noted  

## 2019-05-17 ENCOUNTER — Other Ambulatory Visit: Payer: Self-pay | Admitting: Family Medicine

## 2019-05-28 ENCOUNTER — Other Ambulatory Visit: Payer: Self-pay | Admitting: Family Medicine

## 2019-05-30 ENCOUNTER — Ambulatory Visit: Payer: Medicare Other

## 2019-05-30 ENCOUNTER — Other Ambulatory Visit: Payer: Self-pay

## 2019-05-30 DIAGNOSIS — Z7901 Long term (current) use of anticoagulants: Secondary | ICD-10-CM

## 2019-05-30 DIAGNOSIS — Z5181 Encounter for therapeutic drug level monitoring: Secondary | ICD-10-CM

## 2019-05-30 DIAGNOSIS — I2699 Other pulmonary embolism without acute cor pulmonale: Secondary | ICD-10-CM | POA: Diagnosis not present

## 2019-05-30 LAB — POCT INR: INR: 2.3 (ref 2.0–3.0)

## 2019-05-30 MED ORDER — WARFARIN SODIUM 5 MG PO TABS: ORAL_TABLET | ORAL | 2 refills | Status: DC

## 2019-05-30 NOTE — Patient Instructions (Signed)
Description   Start taking 5mg  (1 peach tablet) daily except 2.5mg  (1/2 peach tablet) on Mondays. Recheck INR in 4 weeks. Call with any new meds or procedures [phone].

## 2019-06-24 DIAGNOSIS — I739 Peripheral vascular disease, unspecified: Secondary | ICD-10-CM | POA: Diagnosis not present

## 2019-06-24 DIAGNOSIS — I2510 Atherosclerotic heart disease of native coronary artery without angina pectoris: Secondary | ICD-10-CM | POA: Insufficient documentation

## 2019-06-24 DIAGNOSIS — B351 Tinea unguium: Secondary | ICD-10-CM | POA: Diagnosis not present

## 2019-06-24 DIAGNOSIS — M216X9 Other acquired deformities of unspecified foot: Secondary | ICD-10-CM | POA: Diagnosis not present

## 2019-06-24 DIAGNOSIS — L851 Acquired keratosis [keratoderma] palmaris et plantaris: Secondary | ICD-10-CM | POA: Diagnosis not present

## 2019-06-24 DIAGNOSIS — R601 Generalized edema: Secondary | ICD-10-CM | POA: Diagnosis not present

## 2019-06-24 NOTE — Progress Notes (Signed)
Cardiology Office Note:    Date:  06/25/2019   ID:  Damarys Speir, DOB 10/12/1940, MRN [id number]  PCP:  Hannah Macadam, MD  Cardiologist:  Hannah Carnes, MD   Electrophysiologist:  None   Referring MD: Hannah Macadam, MD   Chief Complaint:  Follow-up (CHF, Cor calcification)    Patient Profile:    Hannah [name] is a 79 y.o. female with:   Prior massive pulmonary embolism   Lifelong anticoagulation (Warfarin)  Diastolic CHF  Echocardiogram 10/2018: EF 55-60, Gr 1 DD  Coronary calcification on CT  Myoview 12/2018: Low risk  Diabetes mellitus   Hypertension   Hyperlipidemia   Hypothyroidism   Hx of large bowel stricture, s/p colectomy w colostomy   admx in 12/2017 w L breast hematoma and cellulitis; Hgb dropped from 11.5>>8.8 (INR = 3.48); Warfarin held for 2 weeks  admx for Syncope in 10/2018; neg workup (CT, MRI, Echocardiogram); felt to be due to orthostatic hypotension; poor hydration   Prior CV studies: Myoview 12/16/2018 No ischemia or infarction, EF 57; Low Risk   Echocardiogram 10/31/2018 EF 55-60, Gr 1 DD, normal RVSF, mild MAC, mild MR, mild TR   History of Present Illness:    Ms. [name] was last seen by Daune Perch, NP via Telemedicine in 06/2018.  She was admitted in October 2020 with recurrent syncope.  Hospital records were reviewed.  Work-up was negative including CT and MRI as well as echocardiogram.  Her syncope was felt to be related to orthostatic hypotension.  Systolic blood pressure was in the 60s upon presentation.  She was then admitted in December 2020 with chest discomfort.  She was seen by cardiology.  Records were reviewed.  Troponin levels were not significantly elevated.  CT was neg for pulmonary embolism.  Nuclear stress test demonstrated no ischemia or infarction with normal EF.  This was felt to be a low risk study.  She returns for follow-up.  She is here alone.  She has been doing well without chest discomfort or  significant shortness of breath.  She has some occasional lower extremity swelling which is resolved by elevation as well as taking furosemide.  She has not had syncope or near syncope.     Past Medical History:  Diagnosis Date  . Acute massive pulmonary embolism ([place]) 07/13/2012   Massive PE w/ PEA arrest 07/13/12 >TNK >IVC filter >discharged on comadin    . Anticoagulated on warfarin   . Chronic diastolic CHF (congestive heart failure) ([place]) 03/15/2017  . Chronic kidney disease, stage 3a 12/15/2018  . Diabetes mellitus, type 2 ([place]) 07/15/2012   with peripheral neuropathy  . DM2 (diabetes mellitus, type 2) ([facility]) 07/23/2012  . Hyperlipemia 07/14/2012  . Hyperlipidemia 02/25/2013  . Hypertension 07/14/2012  . Hypothyroid 06/06/2013  . Large bowel stricture (HCC)    s/p colectomy in 2016  . Neuropathy 02/12/2015  . Osteoarthritis    s/p hip and knee replacements  . Pulmonary hypertension ([place]) 07/26/2016  . Syncope 10/2018  . Thyroid disease     Current Medications: Current Meds  Medication Sig  . acetaminophen (TYLENOL) 500 MG tablet Take 500 mg by mouth every 6 (six) hours as needed for mild pain.   Marland Kitchen amLODipine (NORVASC) 2.5 MG tablet TAKE 1 TABLET BY MOUTH AT BEDTIME  . Blood Glucose Monitoring Suppl (ONETOUCH VERIO) w/Device KIT 1 each by Does not apply route as directed.  . Cyanocobalamin (VITAMIN B-12 PO) Take 1 tablet by mouth daily.  . furosemide (LASIX) 20  MG tablet Take 1 tablet (20 mg total) by mouth daily as needed for fluid or edema.  Marland Kitchen glucose blood (ONETOUCH VERIO) test strip Use as instructed once a day  . levothyroxine (EUTHYROX) 25 MCG tablet Take 1 tablet (25 mcg total) by mouth daily before breakfast.  . lisinopril (ZESTRIL) 40 MG tablet Take 1 tablet by mouth once daily  . metFORMIN (GLUCOPHAGE-XR) 500 MG 24 hr tablet Take 1 tablet (500 mg total) by mouth daily with breakfast.  . metoprolol tartrate (LOPRESSOR) 25 MG tablet Take 1 tablet (25 mg total) by mouth 2 (two)  times daily.  [name] Delica Lancets 16W MISC USE 1  TO CHECK GLUCOSE ONCE DAILY  . polyethylene glycol (MIRALAX / GLYCOLAX) packet Take 17 g by mouth daily.  . simvastatin (ZOCOR) 20 MG tablet Take 1 tablet (20 mg total) by mouth at bedtime.  . vitamin C (ASCORBIC ACID) 500 MG tablet Take 500 mg by mouth daily.   Marland Kitchen warfarin (COUMADIN) 5 MG tablet TAKE 1/2 TO 1 (ONE-HALF TO ONE) TABLET BY MOUTH DAILY AS DIRECTED BY  COUMADIN  CLINIC     Allergies:   Augmentin [amoxicillin-pot clavulanate], Tranxene [clorazepate], and Penicillins   Social History   Tobacco Use  . Smoking status: Former Smoker    Packs/day: 1.00    Years: 10.00    Pack years: 10.00    Types: Cigarettes    Quit date: 01/04/1968    Years since quitting: 51.5  . Smokeless tobacco: Never Used  Vaping Use  . Vaping Use: Never used  Substance Use Topics  . Alcohol use: No  . Drug use: No     Family Hx: The patient's family history includes Breast cancer in her mother.  ROS   EKGs/Labs/Other Test Reviewed:    EKG:  EKG is   ordered today.  The ekg ordered today demonstrates normal sinus rhythm, heart rate 73, possible incomplete right bundle branch block, left anterior fascicular block, LVH, nonspecific ST-T wave changes, QTC 407, no change from prior tracings  Recent Labs: 10/30/2018: B Natriuretic Peptide 156.4 11/02/2018: Magnesium 2.1 04/24/2019: ALT 13; BUN 31; Creatinine, Ser 1.22; Hemoglobin 13.3; Platelets 218.0; Potassium 4.4; Sodium 138; TSH 1.27   Recent Lipid Panel Lab Results  Component Value Date/Time   CHOL 174 12/15/2018 10:39 AM   TRIG 86 12/15/2018 10:39 AM   HDL 76 12/15/2018 10:39 AM   CHOLHDL 2.3 12/15/2018 10:39 AM   LDLCALC 81 12/15/2018 10:39 AM    Physical Exam:    VS:  BP 100/70   Pulse 73   Ht _0  (1.702 m)   Wt 181 lb 1.9 oz (82.2 kg)   SpO2 93%   BMI 28.37 kg/m     Wt Readings from Last 3 Encounters:  06/25/19 181 lb 1.9 oz (82.2 kg)  04/24/19 172 lb 3.2 oz (78.1  kg)  01/23/19 159 lb 12.8 oz (72.5 kg)     Constitutional:      Appearance: Healthy appearance. Not in distress.  Neck:     Thyroid: No thyromegaly.     Vascular: JVD normal.  Pulmonary:     Effort: Pulmonary effort is normal.     Breath sounds: No wheezing. No rales.  Cardiovascular:     Normal rate. Regular rhythm. Normal S1. Normal S2.     Murmurs: There is no murmur.  Edema:    Ankle: bilateral trace edema of the ankle. Abdominal:     Palpations: Abdomen is soft. There is  no hepatomegaly.  Skin:    General: Skin is warm and dry.  Neurological:     Mental Status: Alert and oriented to person, place and time.     Cranial Nerves: Cranial nerves are intact.       ASSESSMENT & PLAN:    1. Chronic diastolic CHF (congestive heart failure) (HCC) Echocardiogram in October 2020 demonstrated normal LV function with mild diastolic dysfunction.  Overall, volume status is stable.  She takes furosemide as needed.  Continue current therapy.  2. Coronary artery calcification seen on CT scan Nuclear stress test in December 2020 was low risk.  She is not having anginal symptoms.  She is not on aspirin as she is on Warfarin.  Continue simvastatin.  3. History of pulmonary embolism 4. Chronic anticoagulation Her Warfarin is managed by our Coumadin clinic.  5. Essential hypertension Blood pressure somewhat low today.  She is not symptomatic.  I have asked her to send [place] some blood pressure readings over a couple of weeks.  If her pressure continues to run on the low side, it may be best to stop amlodipine given her prior history    Dispo:  Return in about 1 year (around 06/24/2020) for Routine Follow Up, w/ Dr. Harrington Challenger, or Richardson Dopp, PA-C, in person.   Medication Adjustments/Labs and Tests Ordered: Current medicines are reviewed at length with the patient today.  Concerns regarding medicines are outlined above.  Tests Ordered: Orders Placed This Encounter  Procedures  . EKG 12-Lead    Medication Changes: No orders of the defined types were placed in this encounter.   Signed, Richardson Dopp, PA-C  06/25/2019 9:02 AM    [facility] [address], [place], [place]  [address] Phone: [phone]; Fax: [phone]

## 2019-06-25 ENCOUNTER — Ambulatory Visit: Payer: Medicare Other | Admitting: Physician Assistant

## 2019-06-25 ENCOUNTER — Other Ambulatory Visit: Payer: Self-pay

## 2019-06-25 ENCOUNTER — Encounter: Payer: Self-pay | Admitting: Physician Assistant

## 2019-06-25 ENCOUNTER — Ambulatory Visit (INDEPENDENT_AMBULATORY_CARE_PROVIDER_SITE_OTHER): Payer: Medicare Other | Admitting: *Deleted

## 2019-06-25 VITALS — BP 100/70 | HR 73 | Ht 67.0 in | Wt 181.1 lb

## 2019-06-25 DIAGNOSIS — I5032 Chronic diastolic (congestive) heart failure: Secondary | ICD-10-CM | POA: Diagnosis not present

## 2019-06-25 DIAGNOSIS — Z7901 Long term (current) use of anticoagulants: Secondary | ICD-10-CM | POA: Diagnosis not present

## 2019-06-25 DIAGNOSIS — Z5181 Encounter for therapeutic drug level monitoring: Secondary | ICD-10-CM

## 2019-06-25 DIAGNOSIS — I2699 Other pulmonary embolism without acute cor pulmonale: Secondary | ICD-10-CM

## 2019-06-25 DIAGNOSIS — Z86711 Personal history of pulmonary embolism: Secondary | ICD-10-CM

## 2019-06-25 DIAGNOSIS — I10 Essential (primary) hypertension: Secondary | ICD-10-CM | POA: Diagnosis not present

## 2019-06-25 DIAGNOSIS — I2510 Atherosclerotic heart disease of native coronary artery without angina pectoris: Secondary | ICD-10-CM

## 2019-06-25 LAB — POCT INR: INR: 3.5 — AB (ref 2.0–3.0)

## 2019-06-25 NOTE — Patient Instructions (Signed)
Medication Instructions:   Your physician recommends that you continue on your current medications as directed. Please refer to the Current Medication list given to you today.  *If you need a refill on your cardiac medications before your next appointment, please call your pharmacy*  Lab Work:  None ordered today  If you have labs (blood work) drawn today and your tests are completely normal, you will receive your results only by: Marland Kitchen MyChart Message (if you have MyChart) OR . A paper copy in the mail If you have any lab test that is abnormal or we need to change your treatment, we will call you to review the results.  Testing/Procedures:  None ordered today  Follow-Up: At [facility], you and your health needs are our priority.  As part of our continuing mission to provide you with exceptional heart care, we have created designated Provider Care Teams.  These Care Teams include your primary Cardiologist (physician) and Advanced Practice Providers (APPs -  Physician Assistants and Nurse Practitioners) who all work together to provide you with the care you need, when you need it.  We recommend signing up for the patient portal called "MyChart".  Sign up information is provided on this After Visit Summary.  MyChart is used to connect with patients for Virtual Visits (Telemedicine).  Patients are able to view lab/test results, encounter notes, upcoming appointments, etc.  Non-urgent messages can be sent to your provider as well.   To learn more about what you can do with MyChart, go to [email or web address].    Your next appointment:   12 month(s)  The format for your next appointment:   In Person  Provider:   You may see Dietrich Pates, MD or Tereso Newcomer, PA-C  Other Instructions  Have your daughter send blood pressure readings through my chart if possible.

## 2019-06-25 NOTE — Patient Instructions (Signed)
Description   Continue taking Warfarin taking 5mg  (1 peach tablet) daily except 2.5mg  (1/2 peach tablet) on Mondays. Recheck INR in 4 weeks. Call with any new meds or procedures [phone].

## 2019-06-26 ENCOUNTER — Other Ambulatory Visit: Payer: Self-pay

## 2019-06-26 ENCOUNTER — Ambulatory Visit (HOSPITAL_COMMUNITY)
Admission: RE | Admit: 2019-06-26 | Discharge: 2019-06-26 | Disposition: A | Discharge status: Home / Self Care | Payer: Medicare Other | Source: Ambulatory Visit | Attending: Internal Medicine | Admitting: Internal Medicine

## 2019-06-26 ENCOUNTER — Encounter (HOSPITAL_COMMUNITY): Payer: Self-pay

## 2019-06-26 VITALS — BP 143/77 | HR 64 | Temp 98.9°F | Resp 14

## 2019-06-26 DIAGNOSIS — M461 Sacroiliitis, not elsewhere classified: Secondary | ICD-10-CM

## 2019-06-26 MED ORDER — TRAMADOL HCL 50 MG PO TABS: 50.0000 mg | ORAL_TABLET | Freq: Every evening | ORAL | 0 refills | Status: DC | PRN

## 2019-06-26 MED ORDER — DICLOFENAC SODIUM 1 % EX GEL: 4.0000 g | Freq: Four times a day (QID) | CUTANEOUS | Status: DC

## 2019-06-26 NOTE — ED Triage Notes (Signed)
Pt c/o right hip pain onset Monday. Pt states that she has had 3 surgeries on the left hip but nothing on the right. She states it feels like it is throbbing. She has been taking Tylenol and Tylenol PM for the pain. Pt denies an injury or fall.

## 2019-06-27 NOTE — ED Provider Notes (Signed)
[facility]    CSN: [id number] Arrival date & time: 06/26/19  1251      History   Chief Complaint Chief Complaint  Patient presents with  . Appointment  . Hip Pain    HPI Hannah [name] is a 79 y.o. female comes to the urgent care with right hip pain which started few days ago.  Patient describes the pain as throbbing, worse on walking and not relieved by Tylenol Tylenol PM.  Pain is currently moderately severe.  She denies any fall or injury.  No fever or chills.  No nausea or vomiting.  Patient has had left hip arthroplasty.  No numbness or tingling.   HPI  Past Medical History:  Diagnosis Date  . Acute massive pulmonary embolism ([place]) 07/13/2012   Massive PE w/ PEA arrest 07/13/12 >TNK >IVC filter >discharged on comadin    . Anticoagulated on warfarin   . Chronic diastolic CHF (congestive heart failure) ([place]) 03/15/2017  . Chronic kidney disease, stage 3a 12/15/2018  . Diabetes mellitus, type 2 ([place]) 07/15/2012   with peripheral neuropathy  . DM2 (diabetes mellitus, type 2) ([place]) 07/23/2012  . Hyperlipemia 07/14/2012  . Hyperlipidemia 02/25/2013  . Hypertension 07/14/2012  . Hypothyroid 06/06/2013  . Large bowel stricture (HCC)    s/p colectomy in 2016  . Neuropathy 02/12/2015  . Osteoarthritis    s/p hip and knee replacements  . Pulmonary hypertension ([place]) 07/26/2016  . Syncope 10/2018  . Thyroid disease     Patient Active Problem List   Diagnosis Date Noted  . Coronary artery calcification seen on CT scan 06/24/2019  . Hypertension 04/25/2019  . Chronic kidney disease, stage 3a 12/15/2018  . Syncope 10/30/2018  . Frequent falls 05/21/2018  . Encounter for therapeutic drug monitoring 05/07/2018  . Large bowel stricture (HCC) 12/15/2017  . Chronic diastolic CHF (congestive heart failure) ([place]) 03/15/2017  . Anticoagulated on warfarin   . Pulmonary hypertension ([place]) 07/26/2016  . Neuropathy 02/12/2015  . Osteoarthritis 08/12/2014  . History of pulmonary  embolism 06/10/2013  . Hypothyroid 06/06/2013  . Diabetes mellitus with neurological manifestations, controlled ([place]) 06/06/2013  . Hyperlipidemia 02/25/2013  . DM2 (diabetes mellitus, type 2) ([place]) 07/23/2012    Past Surgical History:  Procedure Laterality Date  . COLONOSCOPY N/A 03/12/2016   Procedure: COLONOSCOPY;  Surgeon: Teena Irani, MD;  Location: [facility] ENDOSCOPY;  Service: Endoscopy;  Laterality: N/A;  . COLOSTOMY N/A 06/03/2014   Procedure: COLOSTOMY;  Surgeon: Erroll Luna, MD;  Location: [place];  Service: General;  Laterality: N/A;  . FLEXIBLE SIGMOIDOSCOPY N/A 05/30/2014   Procedure: Beryle Quant;  Surgeon: Carol Ada, MD;  Location: [facility] ENDOSCOPY;  Service: Endoscopy;  Laterality: N/A;  . INSERTION OF VENA CAVA FILTER N/A 07/16/2012   Procedure: INSERTION OF VENA CAVA FILTER;  Surgeon: Elam Dutch, MD;  Location: [facility] CATH LAB;  Service: Cardiovascular;  Laterality: N/A;  . KNEE ARTHROSCOPY    . PARTIAL COLECTOMY N/A 06/03/2014   Procedure: PARTIAL COLECTOMY;  Surgeon: Erroll Luna, MD;  Location: [place];  Service: General;  Laterality: N/A;  . REDUCTION MAMMAPLASTY Bilateral   . SP ARTHRO HIP*L*      OB History   No obstetric history on file.      Home Medications    Prior to Admission medications   Medication Sig Start Date End Date Taking? Authorizing Provider  acetaminophen (TYLENOL) 500 MG tablet Take 500 mg by mouth every 6 (six) hours as needed for mild pain.  Yes [provider]  amLODipine (NORVASC) 2.5 MG tablet TAKE 1 TABLET BY MOUTH AT BEDTIME 03/27/19  Yes Koberlein, Junell C, MD  Blood Glucose Monitoring Suppl (ONETOUCH VERIO) w/Device KIT 1 each by Does not apply route as directed. 06/07/18  Yes Koberlein, Junell C, MD  Cyanocobalamin (VITAMIN B-12 PO) Take 1 tablet by mouth daily.   Yes [provider]  furosemide (LASIX) 20 MG tablet Take 1 tablet (20 mg total) by mouth daily as needed for fluid or edema. 06/28/18  Yes Colin Benton R, DO  glucose blood (ONETOUCH VERIO) test strip Use as instructed once a day 12/07/18  Yes Koberlein, Steele Berg, MD  levothyroxine (EUTHYROX) 25 MCG tablet Take 1 tablet (25 mcg total) by mouth daily before breakfast. 05/28/19  Yes Koberlein, Junell C, MD  lisinopril (ZESTRIL) 40 MG tablet Take 1 tablet by mouth once daily 05/17/19  Yes Koberlein, Junell C, MD  metFORMIN (GLUCOPHAGE-XR) 500 MG 24 hr tablet Take 1 tablet (500 mg total) by mouth daily with breakfast. 01/23/19  Yes Koberlein, Junell C, MD  metoprolol tartrate (LOPRESSOR) 25 MG tablet Take 1 tablet (25 mg total) by mouth 2 (two) times daily. 05/15/19  Yes Koberlein, Steele Berg, MD  OneTouch Delica Lancets 95G MISC USE 1  TO CHECK GLUCOSE ONCE DAILY 07/02/18  Yes Koberlein, Junell C, MD  polyethylene glycol (MIRALAX / GLYCOLAX) packet Take 17 g by mouth daily. 03/19/17  Yes Elodia Florence., MD  simvastatin (ZOCOR) 20 MG tablet Take 1 tablet (20 mg total) by mouth at bedtime. 02/04/19  Yes Koberlein, Steele Berg, MD  vitamin C (ASCORBIC ACID) 500 MG tablet Take 500 mg by mouth daily.    Yes [provider]  warfarin (COUMADIN) 5 MG tablet TAKE 1/2 TO 1 (ONE-HALF TO ONE) TABLET BY MOUTH DAILY AS DIRECTED BY  COUMADIN  CLINIC 05/30/19  Yes Fay Records, MD  diclofenac Sodium (VOLTAREN) 1 % GEL Apply 4 g topically 4 (four) times daily. 06/26/19   Quentez Lober, Myrene Galas, MD  traMADol (ULTRAM) 50 MG tablet Take 1 tablet (50 mg total) by mouth at bedtime as needed. 06/26/19   Kella Splinter, Myrene Galas, MD    Family History Family History  Problem Relation Age of Onset  . Breast cancer Mother     Social History Social History   Tobacco Use  . Smoking status: Former Smoker    Packs/day: 1.00    Years: 10.00    Pack years: 10.00    Types: Cigarettes    Quit date: 01/04/1968    Years since quitting: 51.5  . Smokeless tobacco: Never Used  Vaping Use  . Vaping Use: Never used  Substance Use Topics  . Alcohol use: No  . Drug use: No      Allergies   Augmentin [amoxicillin-pot clavulanate], Tranxene [clorazepate], and Penicillins   Review of Systems Review of Systems  Constitutional: Negative for activity change, chills, fatigue and fever.  Respiratory: Negative.   Gastrointestinal: Negative.  Negative for nausea and vomiting.  Musculoskeletal: Positive for arthralgias. Negative for back pain, gait problem, joint swelling and myalgias.  Skin: Negative.  Negative for color change, rash and wound.  Neurological: Negative for dizziness, numbness and headaches.     Physical Exam Triage Vital Signs ED Triage Vitals  Enc Vitals Group     BP 06/26/19 1312 (!) 143/77     Pulse Rate 06/26/19 1312 64     Resp 06/26/19 1312 14  Temp 06/26/19 1312 98.9 F (37.2 C)     Temp Source 06/26/19 1312 Oral     SpO2 06/26/19 1312 100 %     Weight --      Height --      Head Circumference --      Peak Flow --      Pain Score 06/26/19 1310 10     Pain Loc --      Pain Edu? --      Excl. in [place]? --    No data found.  Updated Vital Signs BP (!) 143/77 (BP Location: Left Arm)   Pulse 64   Temp 98.9 F (37.2 C) (Oral)   Resp 14   SpO2 100%   Visual Acuity Right Eye Distance:   Left Eye Distance:   Bilateral Distance:    Right Eye Near:   Left Eye Near:    Bilateral Near:     Physical Exam Vitals and nursing note reviewed.  Constitutional:      Appearance: Normal appearance.  Cardiovascular:     Rate and Rhythm: Normal rate and regular rhythm.     Pulses: Normal pulses.     Heart sounds: Normal heart sounds.  Pulmonary:     Effort: Pulmonary effort is normal.     Breath sounds: Normal breath sounds.  Abdominal:     General: Bowel sounds are normal.  Musculoskeletal:        General: No swelling, tenderness or deformity. Normal range of motion.     Comments: Tenderness over the right sacroiliac joint.  Skin:    General: Skin is warm.     Capillary Refill: Capillary refill takes less than 2 seconds.   Neurological:     General: No focal deficit present.     Mental Status: She is alert and oriented to person, place, and time.      UC Treatments / Results  Labs (all labs ordered are listed, but only abnormal results are displayed) Labs Reviewed - No data to display  EKG   Radiology No results found.  Procedures Procedures (including critical care time)  Medications Ordered in UC Medications - No data to display  Initial Impression / Assessment and Plan / UC Course  I have reviewed the triage vital signs and the nursing notes.  Pertinent labs & imaging results that were available during my care of the patient were reviewed by me and considered in my medical decision making (see chart for details).     1.  Right sacroiliitis: Please use heating pad Diclofenac gel Patient is on Coumadin and hence cannot tolerate NSAIDs Tramadol as needed at bedtime Gentle range of motion exercises Patient is advised to return to urgent care to be reevaluated if symptoms persist. Final Clinical Impressions(s) / UC Diagnoses   Final diagnoses:  Sacroiliitis [facility])   Discharge Instructions   None    ED Prescriptions    Medication Sig Dispense Auth. Provider   diclofenac Sodium (VOLTAREN) 1 % GEL Apply 4 g topically 4 (four) times daily.  Chase Picket, MD   traMADol (ULTRAM) 50 MG tablet Take 1 tablet (50 mg total) by mouth at bedtime as needed. 7 tablet Creedon Danielski, Myrene Galas, MD     I have reviewed the PDMP during this encounter.   Chase Picket, MD 06/27/19 [phone]

## 2019-07-05 ENCOUNTER — Ambulatory Visit: Payer: Medicare Other | Admitting: Internal Medicine

## 2019-07-11 ENCOUNTER — Other Ambulatory Visit: Payer: Self-pay | Admitting: Family Medicine

## 2019-07-14 ENCOUNTER — Other Ambulatory Visit: Payer: Self-pay | Admitting: Family Medicine

## 2019-07-23 ENCOUNTER — Other Ambulatory Visit: Payer: Self-pay

## 2019-07-23 ENCOUNTER — Ambulatory Visit: Payer: Medicare Other

## 2019-07-23 DIAGNOSIS — I2699 Other pulmonary embolism without acute cor pulmonale: Secondary | ICD-10-CM | POA: Diagnosis not present

## 2019-07-23 DIAGNOSIS — Z7901 Long term (current) use of anticoagulants: Secondary | ICD-10-CM | POA: Diagnosis not present

## 2019-07-23 DIAGNOSIS — Z5181 Encounter for therapeutic drug level monitoring: Secondary | ICD-10-CM

## 2019-07-23 LAB — POCT INR: INR: 5.4 — AB (ref 2.0–3.0)

## 2019-07-23 NOTE — Patient Instructions (Signed)
Description   Skip 2 dosages of Warfarin, then start taking  5mg  (1 peach tablet) daily except 2.5mg  (1/2 peach tablet) on Mondays and Thursdays. Recheck INR in 2 weeks. Call with any new meds or procedures [phone].

## 2019-08-06 ENCOUNTER — Other Ambulatory Visit: Payer: Self-pay

## 2019-08-06 ENCOUNTER — Ambulatory Visit: Payer: Medicare Other | Admitting: *Deleted

## 2019-08-06 DIAGNOSIS — Z5181 Encounter for therapeutic drug level monitoring: Secondary | ICD-10-CM | POA: Diagnosis not present

## 2019-08-06 DIAGNOSIS — Z7901 Long term (current) use of anticoagulants: Secondary | ICD-10-CM

## 2019-08-06 DIAGNOSIS — I2699 Other pulmonary embolism without acute cor pulmonale: Secondary | ICD-10-CM

## 2019-08-06 LAB — POCT INR: INR: 4.3 — AB (ref 2.0–3.0)

## 2019-08-06 NOTE — Patient Instructions (Signed)
Description   Hold warfarin today, then start taking 1 tablet daily except for 1/2 a tablet on Sunday, Tuesday and Thursday.  Recheck INR in 2 weeks. Call with any new meds or procedures [phone].

## 2019-08-10 ENCOUNTER — Other Ambulatory Visit: Payer: Self-pay | Admitting: Family Medicine

## 2019-08-13 ENCOUNTER — Other Ambulatory Visit: Payer: Self-pay | Admitting: Family Medicine

## 2019-08-13 DIAGNOSIS — E1169 Type 2 diabetes mellitus with other specified complication: Secondary | ICD-10-CM

## 2019-08-13 DIAGNOSIS — E785 Hyperlipidemia, unspecified: Secondary | ICD-10-CM

## 2019-08-15 DIAGNOSIS — Z4801 Encounter for change or removal of surgical wound dressing: Secondary | ICD-10-CM | POA: Diagnosis not present

## 2019-08-15 DIAGNOSIS — E119 Type 2 diabetes mellitus without complications: Secondary | ICD-10-CM | POA: Diagnosis not present

## 2019-08-15 DIAGNOSIS — Z933 Colostomy status: Secondary | ICD-10-CM | POA: Diagnosis not present

## 2019-08-15 DIAGNOSIS — K56609 Unspecified intestinal obstruction, unspecified as to partial versus complete obstruction: Secondary | ICD-10-CM | POA: Diagnosis not present

## 2019-08-20 ENCOUNTER — Other Ambulatory Visit: Payer: Self-pay

## 2019-08-20 ENCOUNTER — Ambulatory Visit: Payer: Medicare Other | Admitting: Pharmacist

## 2019-08-20 DIAGNOSIS — I2699 Other pulmonary embolism without acute cor pulmonale: Secondary | ICD-10-CM | POA: Diagnosis not present

## 2019-08-20 DIAGNOSIS — Z5181 Encounter for therapeutic drug level monitoring: Secondary | ICD-10-CM

## 2019-08-20 DIAGNOSIS — Z7901 Long term (current) use of anticoagulants: Secondary | ICD-10-CM | POA: Diagnosis not present

## 2019-08-20 LAB — POCT INR: INR: 4.3 — AB (ref 2.0–3.0)

## 2019-08-20 NOTE — Patient Instructions (Signed)
Description   Hold warfarin today, then start taking 1/2 tablet daily except for 1 tablet on Monday, Wednesday, and Friday.  Recheck INR in 2 weeks. Call with any new meds or procedures [phone].

## 2019-09-03 ENCOUNTER — Ambulatory Visit: Payer: Medicare Other

## 2019-09-03 ENCOUNTER — Other Ambulatory Visit: Payer: Self-pay

## 2019-09-03 DIAGNOSIS — Z7901 Long term (current) use of anticoagulants: Secondary | ICD-10-CM

## 2019-09-03 DIAGNOSIS — Z5181 Encounter for therapeutic drug level monitoring: Secondary | ICD-10-CM | POA: Diagnosis not present

## 2019-09-03 DIAGNOSIS — I2699 Other pulmonary embolism without acute cor pulmonale: Secondary | ICD-10-CM

## 2019-09-03 LAB — POCT INR: INR: 2.9 (ref 2.0–3.0)

## 2019-09-03 NOTE — Patient Instructions (Signed)
Description   Continue on same dosage 1/2 tablet daily except for 1 tablet on Mondays, Wednesdays, and Fridays.  Recheck INR in 3 weeks. Call with any new meds or procedures [phone].

## 2019-09-24 ENCOUNTER — Ambulatory Visit: Payer: Medicare Other | Admitting: Pharmacist

## 2019-09-24 ENCOUNTER — Other Ambulatory Visit: Payer: Self-pay

## 2019-09-24 DIAGNOSIS — Z7901 Long term (current) use of anticoagulants: Secondary | ICD-10-CM

## 2019-09-24 DIAGNOSIS — I2699 Other pulmonary embolism without acute cor pulmonale: Secondary | ICD-10-CM

## 2019-09-24 DIAGNOSIS — Z5181 Encounter for therapeutic drug level monitoring: Secondary | ICD-10-CM

## 2019-09-24 LAB — POCT INR: INR: 3.5 — AB (ref 2.0–3.0)

## 2019-09-24 NOTE — Patient Instructions (Signed)
Continue on same dosage 1/2 tablet daily except for 1 tablet on Mondays, Wednesdays, and Fridays.  Recheck INR in 4 weeks. Call with any new meds or procedures [phone].

## 2019-10-09 ENCOUNTER — Other Ambulatory Visit: Payer: Self-pay | Admitting: Family Medicine

## 2019-10-22 ENCOUNTER — Other Ambulatory Visit: Payer: Self-pay

## 2019-10-22 ENCOUNTER — Ambulatory Visit: Payer: Medicare Other | Admitting: *Deleted

## 2019-10-22 DIAGNOSIS — I2699 Other pulmonary embolism without acute cor pulmonale: Secondary | ICD-10-CM

## 2019-10-22 DIAGNOSIS — Z7901 Long term (current) use of anticoagulants: Secondary | ICD-10-CM

## 2019-10-22 DIAGNOSIS — Z5181 Encounter for therapeutic drug level monitoring: Secondary | ICD-10-CM

## 2019-10-22 LAB — POCT INR: INR: 4.2 — AB (ref 2.0–3.0)

## 2019-10-22 NOTE — Patient Instructions (Signed)
Description   Hold today, then continue on same dosage 1/2 tablet daily except for 1 tablet on Mondays, Wednesdays, and Fridays.  Recheck INR in 2 weeks. Call with any new meds or procedures [phone].

## 2019-10-28 ENCOUNTER — Other Ambulatory Visit: Payer: Self-pay | Admitting: *Deleted

## 2019-10-28 MED ORDER — WARFARIN SODIUM 5 MG PO TABS: ORAL_TABLET | ORAL | 3 refills | Status: DC

## 2019-10-30 ENCOUNTER — Other Ambulatory Visit: Payer: Self-pay

## 2019-10-30 ENCOUNTER — Ambulatory Visit (INDEPENDENT_AMBULATORY_CARE_PROVIDER_SITE_OTHER): Payer: Medicare Other | Admitting: Family Medicine

## 2019-10-30 ENCOUNTER — Encounter: Payer: Self-pay | Admitting: Family Medicine

## 2019-10-30 VITALS — BP 128/82 | HR 88 | Temp 98.4°F | Ht 65.5 in | Wt 180.6 lb

## 2019-10-30 DIAGNOSIS — E7849 Other hyperlipidemia: Secondary | ICD-10-CM

## 2019-10-30 DIAGNOSIS — E038 Other specified hypothyroidism: Secondary | ICD-10-CM | POA: Diagnosis not present

## 2019-10-30 DIAGNOSIS — D649 Anemia, unspecified: Secondary | ICD-10-CM | POA: Diagnosis not present

## 2019-10-30 DIAGNOSIS — E538 Deficiency of other specified B group vitamins: Secondary | ICD-10-CM

## 2019-10-30 DIAGNOSIS — Z86711 Personal history of pulmonary embolism: Secondary | ICD-10-CM

## 2019-10-30 DIAGNOSIS — E611 Iron deficiency: Secondary | ICD-10-CM

## 2019-10-30 DIAGNOSIS — R251 Tremor, unspecified: Secondary | ICD-10-CM

## 2019-10-30 DIAGNOSIS — E1142 Type 2 diabetes mellitus with diabetic polyneuropathy: Secondary | ICD-10-CM | POA: Diagnosis not present

## 2019-10-30 DIAGNOSIS — Z0000 Encounter for general adult medical examination without abnormal findings: Secondary | ICD-10-CM

## 2019-10-30 DIAGNOSIS — Z23 Encounter for immunization: Secondary | ICD-10-CM | POA: Diagnosis not present

## 2019-10-30 DIAGNOSIS — N1831 Chronic kidney disease, stage 3a: Secondary | ICD-10-CM

## 2019-10-30 DIAGNOSIS — R29898 Other symptoms and signs involving the musculoskeletal system: Secondary | ICD-10-CM

## 2019-10-30 DIAGNOSIS — I5032 Chronic diastolic (congestive) heart failure: Secondary | ICD-10-CM

## 2019-10-30 DIAGNOSIS — R944 Abnormal results of kidney function studies: Secondary | ICD-10-CM

## 2019-10-30 DIAGNOSIS — M159 Polyosteoarthritis, unspecified: Secondary | ICD-10-CM

## 2019-10-30 NOTE — Patient Instructions (Signed)
[email or web address] - you can schedule boosters there as well. I would suggest waiting 2 weeks from today when you got your flu shot.

## 2019-10-30 NOTE — Progress Notes (Signed)
Hannah [name] DOB: 1940-12-15 Encounter date: 10/30/2019  This is a 79 y.o. female who presents for complete physical   History of present illness/Additional concerns: Last visit with me was 6 months ago.  She was in urgent care back in June for hip pain.  Getting some shaking of right hand, fingers sometimes. Has noticed this in last couple of weeks. Other day was trying to pour coffee and this was hard; worse with activity. Kids got her heavier spoon/fork which helps.   Has been feeling well overall.   HTN/diastolic HF: she does follow with Dr. Harrington Challenger, cardiology (appointment next week)-amlodipine 2.5 mg, furosemide 20 mg daily as needed, lisinopril 40 mg, metoprolol 25 mg twice daily  Appetite is still doing well; thyroid medication makes her hungrier. Eating more; gaining weight.   Type 2 diabetes: Currently taking Metformin 500 mg once daily. Left sugar log at home. States they have been good.   Some days bowels are very soupy. She is frustrated that she can't have colostomy reversal.  HL: simvastatin $RemoveBeforeD'20mg'[id number]$  nightly.  Went to [place] [place] this past week. Got to be with son, DIL, grandchildren.    Past Medical History:  Diagnosis Date   Acute massive pulmonary embolism ([place]) 07/13/2012   Massive PE w/ PEA arrest 07/13/12 >TNK >IVC filter >discharged on comadin     Anticoagulated on warfarin    Chronic diastolic CHF (congestive heart failure) ([place]) 03/15/2017   Chronic kidney disease, stage 3a ([place]) 12/15/2018   Diabetes mellitus, type 2 ([place]) 07/15/2012   with peripheral neuropathy   DM2 (diabetes mellitus, type 2) ([place]) 07/23/2012   Hyperlipemia 07/14/2012   Hyperlipidemia 02/25/2013   Hypertension 07/14/2012   Hypothyroid 06/06/2013   Large bowel stricture ([place])    s/p colectomy in 2016   Neuropathy 02/12/2015   Osteoarthritis    s/p hip and knee replacements   Pulmonary hypertension ([place]) 07/26/2016   Syncope 10/2018   Thyroid disease    Past Surgical History:   Procedure Laterality Date   COLONOSCOPY N/A 03/12/2016   Procedure: COLONOSCOPY;  Surgeon: Teena Irani, MD;  Location: [place];  Service: Endoscopy;  Laterality: N/A;   COLOSTOMY N/A 06/03/2014   Procedure: COLOSTOMY;  Surgeon: Erroll Luna, MD;  Location: [place];  Service: General;  Laterality: N/A;   FLEXIBLE SIGMOIDOSCOPY N/A 05/30/2014   Procedure: Beryle Quant;  Surgeon: Carol Ada, MD;  Location: [facility] ENDOSCOPY;  Service: Endoscopy;  Laterality: N/A;   INSERTION OF VENA CAVA FILTER N/A 07/16/2012   Procedure: INSERTION OF VENA CAVA FILTER;  Surgeon: Elam Dutch, MD;  Location: [facility] CATH LAB;  Service: Cardiovascular;  Laterality: N/A;   KNEE ARTHROSCOPY     PARTIAL COLECTOMY N/A 06/03/2014   Procedure: PARTIAL COLECTOMY;  Surgeon: Erroll Luna, MD;  Location: [place];  Service: General;  Laterality: N/A;   REDUCTION MAMMAPLASTY Bilateral    SP ARTHRO HIP*L*     Allergies  Allergen Reactions   Augmentin [Amoxicillin-Pot Clavulanate] Itching and Other (See Comments)    Severe vaginal itching   Tranxene [Clorazepate] Itching   Penicillins Itching and Rash    Has patient had a PCN reaction causing immediate rash, facial/tongue/throat swelling, SOB or lightheadedness with hypotension: Yes Has patient had a PCN reaction causing severe rash involving mucus membranes or skin necrosis: No Has patient had a PCN reaction that required hospitalization: No Has patient had a PCN reaction occurring within the last 10 years: No If all of the above answers are "NO", then may proceed  with Cephalosporin use.    Current Meds  Medication Sig   acetaminophen (TYLENOL) 500 MG tablet Take 500 mg by mouth every 6 (six) hours as needed for mild pain.    amLODipine (NORVASC) 2.5 MG tablet TAKE 1 TABLET BY MOUTH AT BEDTIME   Blood Glucose Monitoring Suppl (ONETOUCH VERIO) w/Device KIT 1 each by Does not apply route as directed.   Cyanocobalamin (VITAMIN B-12 PO) Take 1 tablet  by mouth daily.   diclofenac Sodium (VOLTAREN) 1 % GEL Apply 4 g topically 4 (four) times daily.   furosemide (LASIX) 20 MG tablet Take 1 tablet (20 mg total) by mouth daily as needed for fluid or edema.   glucose blood (ONETOUCH VERIO) test strip Use as instructed once a day   levothyroxine (EUTHYROX) 25 MCG tablet Take 1 tablet (25 mcg total) by mouth daily before breakfast.   lisinopril (ZESTRIL) 40 MG tablet Take 1 tablet by mouth once daily   metFORMIN (GLUCOPHAGE-XR) 500 MG 24 hr tablet Take 1 tablet by mouth once daily with breakfast   metoprolol tartrate (LOPRESSOR) 25 MG tablet Take 1 tablet by mouth twice daily   OneTouch Delica Lancets 30G MISC USE 1  TO CHECK GLUCOSE ONCE DAILY   polyethylene glycol (MIRALAX / GLYCOLAX) packet Take 17 g by mouth daily.   simvastatin (ZOCOR) 20 MG tablet TAKE 1 TABLET BY MOUTH AT BEDTIME   traMADol (ULTRAM) 50 MG tablet Take 1 tablet (50 mg total) by mouth at bedtime as needed.   vitamin C (ASCORBIC ACID) 500 MG tablet Take 500 mg by mouth daily.    warfarin (COUMADIN) 5 MG tablet TAKE 1/2 TO 1 (ONE-HALF TO ONE) TABLET BY MOUTH DAILY AS DIRECTED BY  COUMADIN  CLINIC   Social History   Tobacco Use   Smoking status: Former Smoker    Packs/day: 1.00    Years: 10.00    Pack years: 10.00    Types: Cigarettes    Quit date: 01/04/1968    Years since quitting: 51.8   Smokeless tobacco: Never Used  Substance Use Topics   Alcohol use: No   Family History  Problem Relation Age of Onset   Breast cancer Mother      Review of Systems  Constitutional: Negative for activity change, appetite change, chills, fatigue, fever and unexpected weight change.  HENT: Negative for congestion, ear pain, hearing loss, sinus pressure, sinus pain, sore throat and trouble swallowing.   Eyes: Negative for pain and visual disturbance.  Respiratory: Negative for cough, chest tightness, shortness of breath and wheezing.   Cardiovascular: Negative for  chest pain, palpitations and leg swelling.  Gastrointestinal: Positive for diarrhea (sometimes stools are loose; uses colostomy bag). Negative for abdominal pain, blood in stool, constipation, nausea and vomiting.  Genitourinary: Negative for difficulty urinating and menstrual problem.  Musculoskeletal: Negative for arthralgias and back pain.  Skin: Negative for rash.  Neurological: Negative for dizziness, weakness, numbness and headaches.  Hematological: Negative for adenopathy. Does not bruise/bleed easily.  Psychiatric/Behavioral: Negative for sleep disturbance and suicidal ideas. The patient is not nervous/anxious.     CBC:  Lab Results  Component Value Date   WBC 8.7 10/30/2019   HGB 14.8 10/30/2019   HCT 46.9 (H) 10/30/2019   MCH 28.0 10/30/2019   MCHC 31.6 (L) 10/30/2019   RDW 14.7 10/30/2019   PLT 238 10/30/2019   MPV 11.4 10/30/2019   CMP: Lab Results  Component Value Date   NA 145 10/30/2019   K 5.2  10/30/2019   CL 108 10/30/2019   CO2 18 (L) 10/30/2019   ANIONGAP 11 12/16/2018   GLUCOSE 107 (H) 10/30/2019   BUN 27 (H) 10/30/2019   CREATININE 1.31 (H) 10/30/2019   GFRAA 55 (L) 12/16/2018   CALCIUM 10.1 10/30/2019   PROT 7.5 10/30/2019   BILITOT 0.5 10/30/2019   ALKPHOS 66 04/24/2019   ALT 10 10/30/2019   AST 18 10/30/2019   LIPID: Lab Results  Component Value Date   CHOL 180 10/30/2019   TRIG 108 10/30/2019   HDL 65 10/30/2019   LDLCALC 95 10/30/2019    Objective:  BP 128/82 (BP Location: Left Arm, Patient Position: Sitting, Cuff Size: Large)    Pulse 88    Temp 98.4 F (36.9 C) (Oral)    Ht 5' 5.5" (1.664 m)    Wt 180 lb 9.6 oz (81.9 kg)    SpO2 99%    BMI 29.60 kg/m   Weight: 180 lb 9.6 oz (81.9 kg)   BP Readings from Last 3 Encounters:  10/30/19 128/82  06/26/19 (!) 143/77  06/25/19 100/70   Wt Readings from Last 3 Encounters:  10/30/19 180 lb 9.6 oz (81.9 kg)  06/25/19 181 lb 1.9 oz (82.2 kg)  04/24/19 172 lb 3.2 oz (78.1 kg)     Physical Exam Constitutional:      General: She is not in acute distress.    Appearance: She is well-developed.  HENT:     Head: Normocephalic and atraumatic.     Right Ear: External ear normal.     Left Ear: External ear normal.     Mouth/Throat:     Pharynx: No oropharyngeal exudate.  Eyes:     Conjunctiva/sclera: Conjunctivae normal.     Pupils: Pupils are equal, round, and reactive to light.  Neck:     Thyroid: No thyromegaly.  Cardiovascular:     Rate and Rhythm: Normal rate and regular rhythm.     Heart sounds: Normal heart sounds. No murmur heard.  No friction rub. No gallop.   Pulmonary:     Effort: Pulmonary effort is normal.     Breath sounds: Normal breath sounds.  Abdominal:     General: Bowel sounds are normal. There is no distension.     Palpations: Abdomen is soft. There is no mass.     Tenderness: There is no abdominal tenderness. There is no guarding.     Hernia: No hernia is present.  Musculoskeletal:        General: No tenderness or deformity. Normal range of motion.     Cervical back: Normal range of motion and neck supple.  Lymphadenopathy:     Cervical: No cervical adenopathy.  Skin:    General: Skin is warm and dry.     Findings: No rash.  Neurological:     Mental Status: She is alert and oriented to person, place, and time.     Deep Tendon Reflexes: Reflexes normal.     Reflex Scores:      Tricep reflexes are 2+ on the right side and 2+ on the left side.      Bicep reflexes are 2+ on the right side and 2+ on the left side.      Brachioradialis reflexes are 2+ on the right side and 2+ on the left side.      Patellar reflexes are 2+ on the right side and 2+ on the left side.    Comments: 4+ out of 5 strength with bilateral arm  abduction, hip flexion, quad and hamstring testing.  She does have an intentional tremor with use of the right hand.  Initially, tremor appears pill-rolling when she is talking about it, but the rest of the visit at rest  no tremors noted.  It only seems apparent with use of the right hand.  No rigidity or cogwheeling with the wrist.  Psychiatric:        Speech: Speech normal.        Behavior: Behavior normal.        Thought Content: Thought content normal.     Assessment/Plan: There are no preventive care reminders to display for this patient. Health Maintenance reviewed.  1. Preventative health care She is eating better than she has been in the past.  She is working on daily exercises at home. - Hepatitis C antibody; Future - MM DIGITAL SCREENING BILATERAL; Future - Hepatitis C antibody  2. Chronic diastolic CHF (congestive heart failure) ([place]) Fluids well controlled.  She uses Lasix 20 mg as needed to help with fluid control.  She does watch salt intake. - CBC with Differential/Platelet; Future - HM DIABETES FOOT EXAM - CBC with Differential/Platelet  3. Type 2 diabetes mellitus with diabetic polyneuropathy, without long-term current use of insulin (HCC) Blood sugars been well controlled.  She does continue on Metformin 500 mg daily. - Hemoglobin A1c; Future - HM DIABETES FOOT EXAM - Ambulatory referral to Podiatry - Hemoglobin A1c  4. Other specified hypothyroidism Currently on levothyroxine 25 mcg daily.  She has done well with this. - HM DIABETES FOOT EXAM - TSH; Future - TSH  5. Osteoarthritis of multiple joints, unspecified osteoarthritis type She feels that she manages well with her osteoarthritis in general.  6. Chronic kidney disease, stage 3a ([place]) Kidney function has been stable.  We will recheck blood work today.  7. Other hyperlipidemia Simvastatin 20 mg daily.  Recheck blood work today.  Has been stable. - Comprehensive metabolic panel; Future - Lipid panel; Future - Lipid panel - Comprehensive metabolic panel   9. B12 deficiency Recheck levels. - Vitamin B12; Future - Folate; Future - Folate - Vitamin B12  10. Tremor Start with blood work.  Tremors noted just  with intention movements.  Rest of neurologic exam is stable.  Could consider neurology evaluation if worsening or patient preference.  11. Weakness of both legs She does feel it be helpful for her to have an aide to help with showering in the home.  We have also requested home safety evaluation in case there are other items that may be able to help around the house (like shower chair). - HM DIABETES FOOT EXAM - Ambulatory referral to [place]. Need for immunization against influenza - Flu Vaccine QUAD High Dose(Fluad)  13. Abnormal kidney function study - BMP with eGFR([facility]); Future  Return for Pending labs.  Micheline Rough, MD

## 2019-11-02 LAB — COMPREHENSIVE METABOLIC PANEL
AG Ratio: 1.3 (calc) (ref 1.0–2.5)
ALT: 10 U/L (ref 6–29)
AST: 18 U/L (ref 10–35)
Albumin: 4.3 g/dL (ref 3.6–5.1)
Alkaline phosphatase (APISO): 55 U/L (ref 37–153)
BUN/Creatinine Ratio: 21 (calc) (ref 6–22)
BUN: 27 mg/dL — ABNORMAL HIGH (ref 7–25)
CO2: 18 mmol/L — ABNORMAL LOW (ref 20–32)
Calcium: 10.1 mg/dL (ref 8.6–10.4)
Chloride: 108 mmol/L (ref 98–110)
Creat: 1.31 mg/dL — ABNORMAL HIGH (ref 0.60–0.93)
Globulin: 3.2 g/dL (calc) (ref 1.9–3.7)
Glucose, Bld: 107 mg/dL — ABNORMAL HIGH (ref 65–99)
Potassium: 5.2 mmol/L (ref 3.5–5.3)
Sodium: 145 mmol/L (ref 135–146)
Total Bilirubin: 0.5 mg/dL (ref 0.2–1.2)
Total Protein: 7.5 g/dL (ref 6.1–8.1)

## 2019-11-02 LAB — HEMOGLOBIN A1C
Hgb A1c MFr Bld: 6.5 % of total Hgb — ABNORMAL HIGH (ref <5.7)
Mean Plasma Glucose: 140 (calc)
eAG (mmol/L): 7.7 (calc)

## 2019-11-02 LAB — CBC WITH DIFFERENTIAL/PLATELET
Absolute Monocytes: 461 cells/uL (ref 200–950)
Basophils Absolute: 52 cells/uL (ref 0–200)
Basophils Relative: 0.6 %
Eosinophils Absolute: 78 cells/uL (ref 15–500)
Eosinophils Relative: 0.9 %
HCT: 46.9 % — ABNORMAL HIGH (ref 35.0–45.0)
Hemoglobin: 14.8 g/dL (ref 11.7–15.5)
Lymphs Abs: 3089 cells/uL (ref 850–3900)
MCH: 28 pg (ref 27.0–33.0)
MCHC: 31.6 g/dL — ABNORMAL LOW (ref 32.0–36.0)
MCV: 88.8 fL (ref 80.0–100.0)
MPV: 11.4 fL (ref 7.5–12.5)
Monocytes Relative: 5.3 %
Neutro Abs: 5020 cells/uL (ref 1500–7800)
Neutrophils Relative %: 57.7 %
Platelets: 238 10*3/uL (ref 140–400)
RBC: 5.28 10*6/uL — ABNORMAL HIGH (ref 3.80–5.10)
RDW: 14.7 % (ref 11.0–15.0)
Total Lymphocyte: 35.5 %
WBC: 8.7 10*3/uL (ref 3.8–10.8)

## 2019-11-02 LAB — FOLATE: Folate: 7.6 ng/mL

## 2019-11-02 LAB — VITAMIN B12: Vitamin B-12: 489 pg/mL (ref 200–1100)

## 2019-11-02 LAB — LIPID PANEL
Cholesterol: 180 mg/dL (ref <200)
HDL: 65 mg/dL (ref >=50)
LDL Cholesterol (Calc): 95 mg/dL (calc)
Non-HDL Cholesterol (Calc): 115 mg/dL (calc) (ref <130)
Total CHOL/HDL Ratio: 2.8 (calc) (ref <5.0)
Triglycerides: 108 mg/dL (ref <150)

## 2019-11-02 LAB — TEST AUTHORIZATION

## 2019-11-02 LAB — TSH: TSH: 1.48 mIU/L (ref 0.40–4.50)

## 2019-11-02 LAB — IRON: Iron: 120 ug/dL (ref 45–160)

## 2019-11-02 LAB — FERRITIN: Ferritin: 76 ng/mL (ref 16–288)

## 2019-11-02 LAB — HEPATITIS C ANTIBODY
Hepatitis C Ab: NONREACTIVE
SIGNAL TO CUT-OFF: 0.02 (ref <1.00)

## 2019-11-05 ENCOUNTER — Other Ambulatory Visit: Payer: Self-pay

## 2019-11-05 ENCOUNTER — Ambulatory Visit: Payer: Medicare Other | Admitting: Pharmacist

## 2019-11-05 DIAGNOSIS — Z7901 Long term (current) use of anticoagulants: Secondary | ICD-10-CM

## 2019-11-05 DIAGNOSIS — I2699 Other pulmonary embolism without acute cor pulmonale: Secondary | ICD-10-CM

## 2019-11-05 DIAGNOSIS — Z5181 Encounter for therapeutic drug level monitoring: Secondary | ICD-10-CM | POA: Diagnosis not present

## 2019-11-05 LAB — POCT INR: INR: 3.7 — AB (ref 2.0–3.0)

## 2019-11-05 NOTE — Patient Instructions (Signed)
Description   Start taking 1/2 tablet daily except for 1 tablet on Mondays and Fridays. Eat some greens today. Recheck INR in 3 weeks. Call with any new meds or procedures [phone].

## 2019-11-13 ENCOUNTER — Telehealth: Payer: Self-pay | Admitting: Family Medicine

## 2019-11-13 ENCOUNTER — Other Ambulatory Visit: Payer: Self-pay | Admitting: Family Medicine

## 2019-11-13 NOTE — Telephone Encounter (Signed)
  Left message for patient to call back and schedule Medicare Annual Wellness Visit (AWV) either virtually or in office.  Last AWV 10/28/2016  Please schedule at any time with [facility] with the Nurse Health Advisor 1.  45 minute appointment

## 2019-11-18 ENCOUNTER — Ambulatory Visit: Payer: Medicare Other | Admitting: Podiatry

## 2019-11-19 ENCOUNTER — Encounter: Payer: Self-pay | Admitting: Family Medicine

## 2019-11-20 NOTE — Telephone Encounter (Signed)
Spoke with the pts daughter and she stated she wanted to know the details of the last visit as she was not able to come to the visit with the pt.  Message sent to PCP.

## 2019-11-21 ENCOUNTER — Other Ambulatory Visit: Payer: Self-pay | Admitting: Family Medicine

## 2019-11-23 ENCOUNTER — Other Ambulatory Visit: Payer: Self-pay | Admitting: Family Medicine

## 2019-11-26 ENCOUNTER — Ambulatory Visit: Payer: Medicare Other

## 2019-11-26 ENCOUNTER — Other Ambulatory Visit: Payer: Self-pay

## 2019-11-26 DIAGNOSIS — Z7901 Long term (current) use of anticoagulants: Secondary | ICD-10-CM

## 2019-11-26 DIAGNOSIS — Z5181 Encounter for therapeutic drug level monitoring: Secondary | ICD-10-CM

## 2019-11-26 DIAGNOSIS — I2699 Other pulmonary embolism without acute cor pulmonale: Secondary | ICD-10-CM | POA: Diagnosis not present

## 2019-11-26 LAB — POCT INR: INR: 3.1 — AB (ref 2.0–3.0)

## 2019-11-26 NOTE — Patient Instructions (Signed)
Description   Continue on same dosage 1/2 tablet daily except for 1 tablet on Mondays and Fridays. Eat some greens today. Recheck INR in 4 weeks. Call with any new meds or procedures [phone].

## 2019-12-01 ENCOUNTER — Encounter (HOSPITAL_COMMUNITY): Payer: Self-pay | Admitting: Emergency Medicine

## 2019-12-01 ENCOUNTER — Emergency Department (HOSPITAL_COMMUNITY): Payer: Medicare Other

## 2019-12-01 ENCOUNTER — Emergency Department (HOSPITAL_COMMUNITY)
Admission: EM | Admit: 2019-12-01 | Discharge: 2019-12-01 | Disposition: A | Discharge status: Home / Self Care | Payer: Medicare Other | Attending: Emergency Medicine | Admitting: Emergency Medicine

## 2019-12-01 ENCOUNTER — Other Ambulatory Visit: Payer: Self-pay

## 2019-12-01 DIAGNOSIS — Z7901 Long term (current) use of anticoagulants: Secondary | ICD-10-CM | POA: Insufficient documentation

## 2019-12-01 DIAGNOSIS — N1831 Chronic kidney disease, stage 3a: Secondary | ICD-10-CM | POA: Diagnosis not present

## 2019-12-01 DIAGNOSIS — Z86711 Personal history of pulmonary embolism: Secondary | ICD-10-CM | POA: Diagnosis not present

## 2019-12-01 DIAGNOSIS — I130 Hypertensive heart and chronic kidney disease with heart failure and stage 1 through stage 4 chronic kidney disease, or unspecified chronic kidney disease: Secondary | ICD-10-CM | POA: Diagnosis not present

## 2019-12-01 DIAGNOSIS — E1149 Type 2 diabetes mellitus with other diabetic neurological complication: Secondary | ICD-10-CM | POA: Diagnosis not present

## 2019-12-01 DIAGNOSIS — M19011 Primary osteoarthritis, right shoulder: Secondary | ICD-10-CM | POA: Diagnosis not present

## 2019-12-01 DIAGNOSIS — I5032 Chronic diastolic (congestive) heart failure: Secondary | ICD-10-CM | POA: Diagnosis not present

## 2019-12-01 DIAGNOSIS — Z79899 Other long term (current) drug therapy: Secondary | ICD-10-CM | POA: Diagnosis not present

## 2019-12-01 DIAGNOSIS — Z96659 Presence of unspecified artificial knee joint: Secondary | ICD-10-CM | POA: Insufficient documentation

## 2019-12-01 DIAGNOSIS — Z96642 Presence of left artificial hip joint: Secondary | ICD-10-CM | POA: Insufficient documentation

## 2019-12-01 DIAGNOSIS — M25511 Pain in right shoulder: Secondary | ICD-10-CM | POA: Diagnosis not present

## 2019-12-01 DIAGNOSIS — M7501 Adhesive capsulitis of right shoulder: Secondary | ICD-10-CM | POA: Diagnosis not present

## 2019-12-01 DIAGNOSIS — Z7984 Long term (current) use of oral hypoglycemic drugs: Secondary | ICD-10-CM | POA: Diagnosis not present

## 2019-12-01 DIAGNOSIS — Z87891 Personal history of nicotine dependence: Secondary | ICD-10-CM | POA: Diagnosis not present

## 2019-12-01 DIAGNOSIS — E039 Hypothyroidism, unspecified: Secondary | ICD-10-CM | POA: Diagnosis not present

## 2019-12-01 DIAGNOSIS — I10 Essential (primary) hypertension: Secondary | ICD-10-CM | POA: Diagnosis not present

## 2019-12-01 MED ORDER — ACETAMINOPHEN 500 MG PO TABS
1000.0000 mg | ORAL_TABLET | Freq: Once | ORAL | Status: AC
  Administered 2019-12-01: 1000 mg via ORAL
  Filled 2019-12-01: qty 2

## 2019-12-01 MED ORDER — LIDOCAINE 5 % EX PTCH
1.0000 | MEDICATED_PATCH | CUTANEOUS | Status: DC
  Administered 2019-12-01: 1 via TRANSDERMAL
  Filled 2019-12-01: qty 1

## 2019-12-01 NOTE — ED Triage Notes (Addendum)
Pt reports R shoulder pain that started today.  No known injury.  States she is unable to lift R arm due to pain.  Pt hypertensive.  States she took HTN meds today.

## 2019-12-01 NOTE — ED Notes (Signed)
E-signature pad unavailable at time of pt discharge. This RN discussed discharge materials with pt and answered all pt questions. Pt stated understanding of discharge material. ? ?

## 2019-12-01 NOTE — ED Provider Notes (Signed)
[facility] Provider Note   CSN: [id number] Arrival date & time: 12/01/19  1542     History Chief Complaint  Patient presents with  . Shoulder Pain    Hannah [name] is a 79 y.o. female.  79 year old female with past medical history below who presents with right shoulder pain.  Patient began having right shoulder pain this morning that has persisted throughout the day and she has not been able to raise her arm or move her shoulder secondary to pain.  She denies any preceding trauma, heavy lifting, or change in physical activity.  She denies any previous problems with this shoulder.  No fevers or recent illness.  She denies any chest pain, shortness of breath, or other complaints.  No numbness or weakness of right arm.  No pain at elbow or wrist.  The history is provided by the patient.  Shoulder Pain Associated symptoms: no fever        Past Medical History:  Diagnosis Date  . Acute massive pulmonary embolism ([place]) 07/13/2012   Massive PE w/ PEA arrest 07/13/12 >TNK >IVC filter >discharged on comadin    . Anticoagulated on warfarin   . Chronic diastolic CHF (congestive heart failure) ([place]) 03/15/2017  . Chronic kidney disease, stage 3a ([place]) 12/15/2018  . Diabetes mellitus, type 2 ([place]) 07/15/2012   with peripheral neuropathy  . DM2 (diabetes mellitus, type 2) ([place]) 07/23/2012  . Hyperlipemia 07/14/2012  . Hyperlipidemia 02/25/2013  . Hypertension 07/14/2012  . Hypothyroid 06/06/2013  . Large bowel stricture ([facility])    s/p colectomy in 2016  . Neuropathy 02/12/2015  . Osteoarthritis    s/p hip and knee replacements  . Pulmonary hypertension ([place]) 07/26/2016  . Syncope 10/2018  . Thyroid disease     Patient Active Problem List   Diagnosis Date Noted  . Coronary artery calcification seen on CT scan 06/24/2019  . Hypertension 04/25/2019  . Chronic kidney disease, stage 3a ([place]) 12/15/2018  . Syncope 10/30/2018  . Frequent falls 05/21/2018  .  Encounter for therapeutic drug monitoring 05/07/2018  . Large bowel stricture ([facility]) 12/15/2017  . Chronic diastolic CHF (congestive heart failure) ([place]) 03/15/2017  . Anticoagulated on warfarin   . Pulmonary hypertension ([place]) 07/26/2016  . Neuropathy 02/12/2015  . Osteoarthritis 08/12/2014  . History of pulmonary embolism 06/10/2013  . Hypothyroid 06/06/2013  . Diabetes mellitus with neurological manifestations, controlled ([place]) 06/06/2013  . Hyperlipidemia 02/25/2013  . DM2 (diabetes mellitus, type 2) ([place]) 07/23/2012    Past Surgical History:  Procedure Laterality Date  . COLONOSCOPY N/A 03/12/2016   Procedure: COLONOSCOPY;  Surgeon: Teena Irani, MD;  Location: [facility] ENDOSCOPY;  Service: Endoscopy;  Laterality: N/A;  . COLOSTOMY N/A 06/03/2014   Procedure: COLOSTOMY;  Surgeon: Erroll Luna, MD;  Location: [place];  Service: General;  Laterality: N/A;  . FLEXIBLE SIGMOIDOSCOPY N/A 05/30/2014   Procedure: Beryle Quant;  Surgeon: Carol Ada, MD;  Location[facility] ENDOSCOPY;  Service: Endoscopy;  Laterality: N/A;  . INSERTION OF VENA CAVA FILTER N/A 07/16/2012   Procedure: INSERTION OF VENA CAVA FILTER;  Surgeon: Elam Dutch, MD;  Location: [facility] CATH LAB;  Service: Cardiovascular;  Laterality: N/A;  . KNEE ARTHROSCOPY    . PARTIAL COLECTOMY N/A 06/03/2014   Procedure: PARTIAL COLECTOMY;  Surgeon: Erroll Luna, MD;  Location: [place];  Service: General;  Laterality: N/A;  . REDUCTION MAMMAPLASTY Bilateral   . SP ARTHRO HIP*L*       OB History   No obstetric  history on file.     Family History  Problem Relation Age of Onset  . Breast cancer Mother     Social History   Tobacco Use  . Smoking status: Former Smoker    Packs/day: 1.00    Years: 10.00    Pack years: 10.00    Types: Cigarettes    Quit date: 01/04/1968    Years since quitting: 51.9  . Smokeless tobacco: Never Used  Vaping Use  . Vaping Use: Never used  Substance Use Topics  . Alcohol use: No  . Drug use:  No    Home Medications Prior to Admission medications   Medication Sig Start Date End Date Taking? Authorizing Provider  acetaminophen (TYLENOL) 500 MG tablet Take 500 mg by mouth every 6 (six) hours as needed for mild pain.     [provider]  amLODipine (NORVASC) 2.5 MG tablet TAKE 1 TABLET BY MOUTH AT BEDTIME 10/09/19   Koberlein, Junell C, MD  Blood Glucose Monitoring Suppl (ONETOUCH VERIO) w/Device KIT 1 each by Does not apply route as directed. 06/07/18   Koberlein, Steele Berg, MD  Cyanocobalamin (VITAMIN B-12 PO) Take 1 tablet by mouth daily.    [provider]  diclofenac Sodium (VOLTAREN) 1 % GEL Apply 4 g topically 4 (four) times daily. 06/26/19   Lamptey, Myrene Galas, MD  EUTHYROX 25 MCG tablet TAKE 1 TABLET BY MOUTH ONCE DAILY BEFORE BREAKFAST 11/25/19   Koberlein, Steele Berg, MD  furosemide (LASIX) 20 MG tablet Take 1 tablet (20 mg total) by mouth daily as needed for fluid or edema. 06/28/18   Lucretia Kern, DO  glucose blood (ONETOUCH VERIO) test strip Use as instructed once a day 12/07/18   [name], MD  lisinopril (ZESTRIL) 40 MG tablet Take 1 tablet by mouth once daily 11/25/19   Koberlein, Steele Berg, MD  metFORMIN (GLUCOPHAGE-XR) 500 MG 24 hr tablet Take 1 tablet by mouth once daily with breakfast 07/15/19   [name], MD  metoprolol tartrate (LOPRESSOR) 25 MG tablet Take 1 tablet by mouth twice daily 11/13/19   Koberlein, Steele Berg, MD  OneTouch Delica Lancets 12X MISC USE 1  TO CHECK GLUCOSE ONCE DAILY 07/02/18   Koberlein, Steele Berg, MD  polyethylene glycol (MIRALAX / GLYCOLAX) packet Take 17 g by mouth daily. 03/19/17   Elodia Florence., MD  simvastatin (ZOCOR) 20 MG tablet TAKE 1 TABLET BY MOUTH AT BEDTIME 08/14/19   Koberlein, Steele Berg, MD  traMADol (ULTRAM) 50 MG tablet Take 1 tablet (50 mg total) by mouth at bedtime as needed. 06/26/19   Lamptey, Myrene Galas, MD  vitamin C (ASCORBIC ACID) 500 MG tablet Take 500 mg by mouth daily.     [provider]  warfarin (COUMADIN) 5 MG tablet TAKE 1/2 TO 1 (ONE-HALF TO ONE) TABLET BY MOUTH DAILY AS DIRECTED BY  COUMADIN  CLINIC 10/28/19   Fay Records, MD    Allergies    Augmentin [amoxicillin-pot clavulanate], Tranxene [clorazepate], and Penicillins  Review of Systems   Review of Systems  Constitutional: Negative for fever.  Respiratory: Negative for shortness of breath.   Cardiovascular: Negative for chest pain.  Musculoskeletal: Positive for arthralgias. Negative for joint swelling.  Skin: Negative for rash.  Neurological: Negative for numbness.    Physical Exam Updated Vital Signs BP (!) 182/94   Pulse 85   Temp 98.4 F (36.9 C) (Oral)   Resp 16   SpO2 99%   Physical  Exam Vitals and nursing note reviewed.  Constitutional:      General: She is not in acute distress.    Appearance: She is well-developed.  HENT:     Head: Normocephalic and atraumatic.  Eyes:     Conjunctiva/sclera: Conjunctivae normal.  Cardiovascular:     Rate and Rhythm: Normal rate and regular rhythm.     Pulses: Normal pulses.     Heart sounds: Normal heart sounds.  Pulmonary:     Effort: Pulmonary effort is normal.     Breath sounds: Normal breath sounds.  Musculoskeletal:        General: Tenderness present. No swelling or deformity.     Cervical back: Neck supple.     Comments: Limited ROM R shoulder, unable to externally rotate and abduct 2/2 pain; tenderness over deltoid; no joint swelling, warmth or erythema; normal ROM elbow and wrist and normal R grip strength  Skin:    General: Skin is warm and dry.     Findings: No erythema.  Neurological:     Mental Status: She is alert and oriented to person, place, and time.     Sensory: No sensory deficit.  Psychiatric:        Judgment: Judgment normal.     ED Results / Procedures / Treatments   Labs (all labs ordered are listed, but only abnormal results are displayed) Labs Reviewed - No data to  display  EKG None  Radiology DG Shoulder Right  Result Date: 12/01/2019 CLINICAL DATA:  Pain EXAM: RIGHT SHOULDER - 2+ VIEW COMPARISON:  Shoulder radiographs dated 10/21/2018 FINDINGS: There is no evidence of fracture or dislocation. Mild-to-moderate degenerative changes are seen in the right shoulder with areas of bone spurring. There is diffuse osseous demineralization. Soft tissues are unremarkable. IMPRESSION: No acute osseous injury. Mild-to-moderate degenerative changes in the right shoulder. Electronically Signed   By: Zerita Boers M.D.   On: 12/01/2019 17:20    Procedures Procedures (including critical care time)  Medications Ordered in ED Medications  lidocaine (LIDODERM) 5 % 1 patch (has no administration in time range)  acetaminophen (TYLENOL) tablet 1,000 mg (has no administration in time range)    ED Course  I have reviewed the triage vital signs and the nursing notes.  Pertinent imaging results that were available during my care of the patient were reviewed by me and considered in my medical decision making (see chart for details).    MDM Rules/Calculators/A&P                          Neurovasc intact, no signs/sx infection. XR shows degenerative changes. Suspect frozen shoulder/adhesive capsulitis.  Discussed supportive measures including range of motion exercises, heat therapy, lidocaine patches, Tylenol as needed.  Instructed to follow-up with her orthopedic clinic.  She denies any chest pain, shortness of breath, or other symptoms to suggest ACS or other acute life-threatening process. Final Clinical Impression(s) / ED Diagnoses Final diagnoses:  Acute pain of right shoulder  Adhesive capsulitis of right shoulder    Rx / DC Orders ED Discharge Orders    None       Barre Aydelott, Wenda Overland, MD 12/01/19 1906

## 2019-12-03 ENCOUNTER — Other Ambulatory Visit: Payer: Self-pay

## 2019-12-03 ENCOUNTER — Other Ambulatory Visit (INDEPENDENT_AMBULATORY_CARE_PROVIDER_SITE_OTHER): Payer: Medicare Other

## 2019-12-03 DIAGNOSIS — R944 Abnormal results of kidney function studies: Secondary | ICD-10-CM

## 2019-12-04 LAB — BASIC METABOLIC PANEL WITH GFR
BUN/Creatinine Ratio: 17 (calc) (ref 6–22)
BUN: 22 mg/dL (ref 7–25)
CO2: 25 mmol/L (ref 20–32)
Calcium: 9.5 mg/dL (ref 8.6–10.4)
Chloride: 108 mmol/L (ref 98–110)
Creat: 1.32 mg/dL — ABNORMAL HIGH (ref 0.60–0.93)
GFR, Est African American: 45 mL/min/{1.73_m2} — ABNORMAL LOW (ref >=60)
GFR, Est Non African American: 39 mL/min/{1.73_m2} — ABNORMAL LOW (ref >=60)
Glucose, Bld: 104 mg/dL — ABNORMAL HIGH (ref 65–99)
Potassium: 4.9 mmol/L (ref 3.5–5.3)
Sodium: 143 mmol/L (ref 135–146)

## 2019-12-05 ENCOUNTER — Telehealth: Payer: Self-pay | Admitting: Family Medicine

## 2019-12-05 NOTE — Telephone Encounter (Signed)
Pt call to let dr.Koberlein know her bp was 126/74-64 and her blood sugar was 123 .

## 2019-12-06 NOTE — Telephone Encounter (Signed)
Noted  

## 2019-12-09 ENCOUNTER — Other Ambulatory Visit: Payer: Self-pay | Admitting: Family Medicine

## 2019-12-09 DIAGNOSIS — E1169 Type 2 diabetes mellitus with other specified complication: Secondary | ICD-10-CM

## 2019-12-09 DIAGNOSIS — E785 Hyperlipidemia, unspecified: Secondary | ICD-10-CM

## 2019-12-24 ENCOUNTER — Other Ambulatory Visit: Payer: Self-pay

## 2019-12-24 ENCOUNTER — Ambulatory Visit: Payer: Medicare Other

## 2019-12-24 DIAGNOSIS — Z7901 Long term (current) use of anticoagulants: Secondary | ICD-10-CM | POA: Diagnosis not present

## 2019-12-24 DIAGNOSIS — I2699 Other pulmonary embolism without acute cor pulmonale: Secondary | ICD-10-CM | POA: Diagnosis not present

## 2019-12-24 DIAGNOSIS — Z5181 Encounter for therapeutic drug level monitoring: Secondary | ICD-10-CM

## 2019-12-24 LAB — POCT INR: INR: 3.9 — AB (ref 2.0–3.0)

## 2019-12-24 NOTE — Patient Instructions (Signed)
Description   Skip tomorrow's dosage of Warfarin, then start taking 1/2 tablet daily except for 1 tablet on Mondays. Eat some greens today. Recheck INR in 3 weeks. Call with any new meds or procedures [phone].

## 2020-01-06 ENCOUNTER — Other Ambulatory Visit: Payer: Self-pay | Admitting: Family Medicine

## 2020-01-14 ENCOUNTER — Other Ambulatory Visit: Payer: Self-pay

## 2020-01-14 ENCOUNTER — Ambulatory Visit: Payer: Medicare Other | Admitting: *Deleted

## 2020-01-14 DIAGNOSIS — Z7901 Long term (current) use of anticoagulants: Secondary | ICD-10-CM | POA: Diagnosis not present

## 2020-01-14 DIAGNOSIS — I2699 Other pulmonary embolism without acute cor pulmonale: Secondary | ICD-10-CM | POA: Diagnosis not present

## 2020-01-14 DIAGNOSIS — Z5181 Encounter for therapeutic drug level monitoring: Secondary | ICD-10-CM

## 2020-01-14 LAB — POCT INR: INR: 2.5 (ref 2.0–3.0)

## 2020-01-14 NOTE — Patient Instructions (Addendum)
Description    Continue taking 1/2 tablet daily except for 1 tablet on Mondays. Recheck INR in 4 weeks. Call with any new meds or procedures [phone].

## 2020-01-28 ENCOUNTER — Other Ambulatory Visit: Payer: Self-pay | Admitting: Family Medicine

## 2020-01-31 ENCOUNTER — Other Ambulatory Visit: Payer: Self-pay | Admitting: Family Medicine

## 2020-02-07 ENCOUNTER — Other Ambulatory Visit: Payer: Self-pay | Admitting: Family Medicine

## 2020-02-07 DIAGNOSIS — E1142 Type 2 diabetes mellitus with diabetic polyneuropathy: Secondary | ICD-10-CM

## 2020-02-11 ENCOUNTER — Ambulatory Visit: Payer: Medicare Other | Admitting: *Deleted

## 2020-02-11 ENCOUNTER — Other Ambulatory Visit: Payer: Self-pay

## 2020-02-11 DIAGNOSIS — I2699 Other pulmonary embolism without acute cor pulmonale: Secondary | ICD-10-CM | POA: Diagnosis not present

## 2020-02-11 DIAGNOSIS — Z7901 Long term (current) use of anticoagulants: Secondary | ICD-10-CM | POA: Diagnosis not present

## 2020-02-11 DIAGNOSIS — Z5181 Encounter for therapeutic drug level monitoring: Secondary | ICD-10-CM

## 2020-02-11 LAB — POCT INR: INR: 1.7 — AB (ref 2.0–3.0)

## 2020-02-11 NOTE — Patient Instructions (Signed)
Description   Today and tomorrow take 1 tablet then continue taking 1/2 tablet daily except for 1 tablet on Mondays. Recheck INR in 2 weeks. Call with any new meds or procedures [phone].

## 2020-02-12 ENCOUNTER — Telehealth: Payer: Self-pay | Admitting: Family Medicine

## 2020-02-12 NOTE — Telephone Encounter (Signed)
Left message for patient to call back and schedule Medicare Annual Wellness Visit (AWV) either virtually or in office. Did not leave detailed message   Last AWV 10/28/16  please schedule at anytime with [facility] Nurse Health Advisor 1 or 2   This should be a 45 minute visit.

## 2020-02-17 ENCOUNTER — Other Ambulatory Visit: Payer: Self-pay | Admitting: Family Medicine

## 2020-02-25 ENCOUNTER — Telehealth: Payer: Self-pay | Admitting: Family Medicine

## 2020-02-25 ENCOUNTER — Ambulatory Visit: Payer: Medicare Other | Admitting: *Deleted

## 2020-02-25 ENCOUNTER — Other Ambulatory Visit: Payer: Self-pay

## 2020-02-25 DIAGNOSIS — Z7901 Long term (current) use of anticoagulants: Secondary | ICD-10-CM | POA: Diagnosis not present

## 2020-02-25 DIAGNOSIS — Z5181 Encounter for therapeutic drug level monitoring: Secondary | ICD-10-CM | POA: Diagnosis not present

## 2020-02-25 DIAGNOSIS — I2699 Other pulmonary embolism without acute cor pulmonale: Secondary | ICD-10-CM

## 2020-02-25 DIAGNOSIS — E1169 Type 2 diabetes mellitus with other specified complication: Secondary | ICD-10-CM

## 2020-02-25 LAB — POCT INR: INR: 1.8 — AB (ref 2.0–3.0)

## 2020-02-25 NOTE — Telephone Encounter (Signed)
Patient is calling and wanted to see if she was still suppose to take simvastatin (ZOCOR) 20 MG tablet, please advise. CB is [phone]

## 2020-02-25 NOTE — Patient Instructions (Signed)
Description   Today and tomorrow take 1 tablet then start taking 1/2 tablet daily except for 1 tablet on Mondays and Fridays. Recheck INR in 2 weeks. Call with any new meds or procedures [phone].

## 2020-02-26 MED ORDER — SIMVASTATIN 20 MG PO TABS: 20.0000 mg | ORAL_TABLET | Freq: Every day | ORAL | 1 refills | Status: DC

## 2020-02-26 NOTE — Telephone Encounter (Signed)
Spoke with the pt and questioned the reason she asked if she was supposed to be taking Simvastatin as the last office notes indicate she was to continue.  Patient stated she has only missed 2 days,  she received a call from someone, does not recall who or where they were calling from that told her she was not to take the medication.  Attempted to review the chart and could not locate a message indicating this.  Patient is aware refills were sent to [facility].

## 2020-03-10 ENCOUNTER — Other Ambulatory Visit: Payer: Self-pay

## 2020-03-10 ENCOUNTER — Ambulatory Visit (INDEPENDENT_AMBULATORY_CARE_PROVIDER_SITE_OTHER): Payer: Medicare Other

## 2020-03-10 VITALS — BP 150/78 | HR 83 | Temp 98.2°F | Wt 182.7 lb

## 2020-03-10 DIAGNOSIS — Z0000 Encounter for general adult medical examination without abnormal findings: Secondary | ICD-10-CM

## 2020-03-10 DIAGNOSIS — I2699 Other pulmonary embolism without acute cor pulmonale: Secondary | ICD-10-CM | POA: Diagnosis not present

## 2020-03-10 DIAGNOSIS — Z5181 Encounter for therapeutic drug level monitoring: Secondary | ICD-10-CM

## 2020-03-10 DIAGNOSIS — Z7901 Long term (current) use of anticoagulants: Secondary | ICD-10-CM | POA: Diagnosis not present

## 2020-03-10 LAB — POCT INR: INR: 3.7 — AB (ref 2.0–3.0)

## 2020-03-10 NOTE — Patient Instructions (Signed)
-   have a large serving of greens today, then - continue taking 1/2 tablet daily except for 1 tablet on Mondays and Fridays.  - Recheck INR in 2 weeks.  Call with any new meds or procedures [phone].

## 2020-03-10 NOTE — Progress Notes (Signed)
Subjective:   Taeja Debellis is a 80 y.o. female who presents for Medicare Annual (Subsequent) preventive examination.  Review of Systems     Cardiac Risk Factors include: advanced age (>72mn, >>36women);diabetes mellitus;hypertension;dyslipidemia     Objective:    Today's Vitals   03/10/20 1112  BP: (!) 150/78  Pulse: 83  Temp: 98.2 F (36.8 C)  SpO2: 99%  Weight: 182 lb 11.2 oz (82.9 kg)   Body mass index is 29.94 kg/m.  Advanced Directives 03/10/2020 12/16/2018 12/15/2018 10/31/2018 10/30/2018 12/15/2017 12/15/2017  Does Patient Have a Medical Advance Directive? Yes No No - No No No  Type of Advance Directive Living will - - - - - -  Does patient want to make changes to medical advance directive? - - - - - - -  Copy of [place] Chart? - - - - - - -  Would patient like information on creating a medical advance directive? - No - Patient declined No - Patient declined No - Patient declined No - Patient declined No - Patient declined No - Patient declined  Pre-existing out of facility DNR order (yellow form or pink MOST form) - - - - - - -    Current Medications (verified) Outpatient Encounter Medications as of 03/10/2020  Medication Sig  . acetaminophen (TYLENOL) 500 MG tablet Take 500 mg by mouth every 6 (six) hours as needed for mild pain.   .Marland KitchenamLODipine (NORVASC) 2.5 MG tablet TAKE 1 TABLET BY MOUTH AT BEDTIME  . Blood Glucose Monitoring Suppl (ONETOUCH VERIO) w/Device KIT 1 each by Does not apply route as directed.  . Cyanocobalamin (VITAMIN B-12 PO) Take 1 tablet by mouth daily.  . diclofenac Sodium (VOLTAREN) 1 % GEL Apply 4 g topically 4 (four) times daily.  .Arna Medici25 MCG tablet TAKE 1 TABLET BY MOUTH ONCE DAILY BEFORE BREAKFAST  . furosemide (LASIX) 20 MG tablet Take 1 tablet (20 mg total) by mouth daily as needed for fluid or edema.  .Marland Kitchenglucose blood (ONETOUCH VERIO) test strip USE AS DIRECTED ONCE  A  DAY  . lisinopril (ZESTRIL) 40 MG  tablet Take 1 tablet by mouth once daily  . metFORMIN (GLUCOPHAGE-XR) 500 MG 24 hr tablet Take 1 tablet by mouth once daily with breakfast  . metoprolol tartrate (LOPRESSOR) 25 MG tablet Take 1 tablet by mouth twice daily  . OneTouch Delica Lancets 320UMISC USE 1  TO CHECK GLUCOSE ONCE DAILY  . polyethylene glycol (MIRALAX / GLYCOLAX) packet Take 17 g by mouth daily.  . simvastatin (ZOCOR) 20 MG tablet Take 1 tablet (20 mg total) by mouth at bedtime.  . vitamin C (ASCORBIC ACID) 500 MG tablet Take 500 mg by mouth daily.   .Marland Kitchenwarfarin (COUMADIN) 5 MG tablet TAKE 1/2 TO 1 (ONE-HALF TO ONE) TABLET BY MOUTH DAILY AS DIRECTED BY  COUMADIN  CLINIC  . traMADol (ULTRAM) 50 MG tablet Take 1 tablet (50 mg total) by mouth at bedtime as needed. (Patient not taking: Reported on 03/10/2020)   No facility-administered encounter medications on file as of 03/10/2020.    Allergies (verified) Augmentin [amoxicillin-pot clavulanate], Tranxene [clorazepate], and Penicillins   History: Past Medical History:  Diagnosis Date  . Acute massive pulmonary embolism ([facility] 07/13/2012   Massive PE w/ PEA arrest 07/13/12 >TNK >IVC filter >discharged on comadin    . Anticoagulated on warfarin   . Chronic diastolic CHF (congestive heart failure) ([facility] 03/15/2017  . Chronic kidney disease, stage  3a ([place]) 12/15/2018  . Diabetes mellitus, type 2 ([place]) 07/15/2012   with peripheral neuropathy  . DM2 (diabetes mellitus, type 2) ([place]) 07/23/2012  . Hyperlipemia 07/14/2012  . Hyperlipidemia 02/25/2013  . Hypertension 07/14/2012  . Hypothyroid 06/06/2013  . Large bowel stricture ([facility])    s/p colectomy in 2016  . Neuropathy 02/12/2015  . Osteoarthritis    s/p hip and knee replacements  . Pulmonary hypertension ([place]) 07/26/2016  . Syncope 10/2018  . Thyroid disease    Past Surgical History:  Procedure Laterality Date  . COLONOSCOPY N/A 03/12/2016   Procedure: COLONOSCOPY;  Surgeon: Teena Irani, MD;  Location[facility] ENDOSCOPY;  Service:  Endoscopy;  Laterality: N/A;  . COLOSTOMY N/A 06/03/2014   Procedure: COLOSTOMY;  Surgeon: Erroll Luna, MD;  Location: [place];  Service: General;  Laterality: N/A;  . FLEXIBLE SIGMOIDOSCOPY N/A 05/30/2014   Procedure: Beryle Quant;  Surgeon: Carol Ada, MD;  Location: [facility] ENDOSCOPY;  Service: Endoscopy;  Laterality: N/A;  . INSERTION OF VENA CAVA FILTER N/A 07/16/2012   Procedure: INSERTION OF VENA CAVA FILTER;  Surgeon: Elam Dutch, MD;  Location[facility] C[facility] LAB;  Service: Cardiovascular;  Laterality: N/A;  . KNEE ARTHROSCOPY    . PARTIAL COLECTOMY N/A 06/03/2014   Procedure: PARTIAL COLECTOMY;  Surgeon: Erroll Luna, MD;  Location: [place];  Service: General;  Laterality: N/A;  . REDUCTION MAMMAPLASTY Bilateral   . SP ARTHRO HIP*L*     Family History  Problem Relation Age of Onset  . Breast cancer Mother    Social History   Socioeconomic History  . Marital status: Widowed    Spouse name: Not on file  . Number of children: Not on file  . Years of education: Not on file  . Highest education level: Not on file  Occupational History  . Not on file  Tobacco Use  . Smoking status: Former Smoker    Packs/day: 1.00    Years: 10.00    Pack years: 10.00    Types: Cigarettes    Quit date: 01/04/1968    Years since quitting: 52.2  . Smokeless tobacco: Never Used  Vaping Use  . Vaping Use: Never used  Substance and Sexual Activity  . Alcohol use: No  . Drug use: No  . Sexual activity: Not on file  Other Topics Concern  . Not on file  Social History Narrative   Lives alone.          Social Determinants of Health   Financial Resource Strain: Low Risk   . Difficulty of Paying Living Expenses: Not hard at all  Food Insecurity: No Food Insecurity  . Worried About Charity fundraiser in the Last Year: Never true  . Ran Out of Food in the Last Year: Never true  Transportation Needs: No Transportation Needs  . Lack of Transportation (Medical): No  . Lack of  Transportation (Non-Medical): No  Physical Activity: Insufficiently Active  . Days of Exercise per Week: 5 days  . Minutes of Exercise per Session: 10 min  Stress: No Stress Concern Present  . Feeling of Stress : Not at all  Social Connections: Moderately Integrated  . Frequency of Communication with Friends and Family: More than three times a week  . Frequency of Social Gatherings with Friends and Family: More than three times a week  . Attends Religious Services: More than 4 times per year  . Active Member of Clubs or Organizations: Yes  . Attends Archivist Meetings: 1  to 4 times per year  . Marital Status: Widowed    Tobacco Counseling Counseling given: Not Answered   Clinical Intake:  Pre-visit preparation completed: Yes  Pain : No/denies pain     BMI - recorded: 29.94 Nutritional Status: BMI 25 -29 Overweight Nutritional Risks: None Diabetes: Yes CBG done?: Yes (124) CBG resulted in Enter/ Edit results?: No Did pt. bring in CBG monitor from home?: No  How often do you need to have someone help you when you read instructions, pamphlets, or other written materials from your doctor or pharmacy?: 1 - Never  Diabetic?Marland KitchenNutrition Risk Assessment:  Has the patient had any N/V/D within the last 2 months?  No  Does the patient have any non-healing wounds?  No  Has the patient had any unintentional weight loss or weight gain?  No   Diabetes:  Is the patient diabetic?  Yes  If diabetic, was a CBG obtained today?  Yes  Did the patient bring in their glucometer from home?  No  How often do you monitor your CBG's? Daily.   Financial Strains and Diabetes Management:  Are you having any financial strains with the device, your supplies or your medication? No .  Does the patient want to be seen by Chronic Care Management for management of their diabetes?  No  Would the patient like to be referred to a Nutritionist or for Diabetic Management?  No   Diabetic  Exams:  Diabetic Eye Exam: Overdue for diabetic eye exam. Pt has been advised about the importance in completing this exam. Patient advised to call and schedule an eye exam. Diabetic Foot Exam: Completed 10/30/19   Interpreter Needed?: No  Information entered by :: Charlott Rakes, LPN   Activities of Daily Living In your present state of health, do you have any difficulty performing the following activities: 03/10/2020  Hearing? N  Vision? N  Difficulty concentrating or making decisions? N  Walking or climbing stairs? Y  Comment slower  Dressing or bathing? N  Doing errands, shopping? N  Preparing Food and eating ? N  Using the Toilet? N  In the past six months, have you accidently leaked urine? N  Do you have problems with loss of bowel control? Y  Comment has a colostomy  Managing your Medications? N  Managing your Finances? N  Housekeeping or managing your Housekeeping? N  Some recent data might be hidden    Patient Care Team: [name], MD as PCP - General (Family Medicine) Fay Records, MD as PCP - Cardiology (Cardiology)  Indicate any recent Medical Services you may have received from other than [facility] providers in the past year (date may be approximate).     Assessment:   This is a routine wellness examination for [place]anna.  Hearing/Vision screen  Hearing Screening   125Hz 250Hz 500Hz 1000Hz 2000Hz 3000Hz 4000Hz 6000Hz 8000Hz  Right ear:           Left ear:           Comments: Pt denies any hearing issues   Vision Screening Comments: Pt follows up annually with Dr  for eye exams  Dietary issues and exercise activities discussed: Current Exercise Habits: The patient does not participate in regular exercise at present (only do leg exercises at this time)  Goals    . patient     To maintain exercise at the church!    . Patient Stated     Continue to stay healthy  Depression Screen PHQ 2/9 Scores 03/10/2020 10/30/2019 09/13/2018 10/28/2016  04/10/2015 08/12/2014 02/25/2013  PHQ - 2 Score 0 0 0 0 0 0 0    Fall Risk Fall Risk  03/10/2020 10/25/2018 10/28/2016 04/10/2015 08/12/2014  Falls in the past year? 1 1 No No No  Number falls in past yr: 1 1 - - -  Injury with Fall? 0 1 - - -  Risk for fall due to : Impaired balance/gait;Impaired mobility;Impaired vision;Orthopedic patient Impaired balance/gait;Impaired mobility;History of fall(s) - - -  Follow up Falls prevention discussed Falls evaluation completed - - -    FALL RISK PREVENTION PERTAINING TO THE HOME:  Any stairs in or around the home? Yes  If so, are there any without handrails? No  Home free of loose throw rugs in walkways, pet beds, electrical cords, etc? Yes  Adequate lighting in your home to reduce risk of falls? Yes   ASSISTIVE DEVICES UTILIZED TO PREVENT FALLS:  Life alert? Yes  Use of a cane, walker or w/c? Yes  Grab bars in the bathroom? No  Shower chair or bench in shower? Yes  Elevated toilet seat or a handicapped toilet? Yes   TIMED UP AND GO:  Was the test performed? Yes .  Length of time to ambulate 10 feet: 15 sec.   Gait slow and steady with assistive device  Cognitive Function: MMSE - Mini Mental State Exam 10/28/2016  Not completed: (No Data)     6CIT Screen 03/10/2020 10/28/2016  What Year? 0 points 0 points  What month? 0 points 0 points  What time? - 0 points  Count back from 20 0 points 0 points  Months in reverse 4 points 0 points  Repeat phrase 2 points 0 points  Total Score - 0    Immunizations Immunization History  Administered Date(s) Administered  . Fluad Quad(high Dose 65+) 09/12/2018, 10/30/2019  . Influenza Split 10/03/2012  . Influenza Whole 10/04/2011  . Influenza, High Dose Seasonal PF 11/12/2014, 10/21/2015, 10/20/2016, 09/19/2017  . Influenza,inj,Quad PF,6+ Mos 11/07/2013  . Influenza-Unspecified 10/03/2016  . PFIZER(Purple Top)SARS-COV-2 Vaccination 02/26/2019, 03/09/2019, 12/20/2019  . Pneumococcal Conjugate-13  04/07/2014, 10/20/2016  . Pneumococcal-Unspecified 10/04/2011  . Tdap 04/07/2014  . Zoster 07/23/2015    TDAP status: Up to date  Flu Vaccine status: Up to date  Pneumococcal vaccine status: Up to date  Covid-19 vaccine status: Completed vaccines  Qualifies for Shingles Vaccine? Yes   Zostavax completed Yes   Shingrix Completed?: No.    Education has been provided regarding the importance of this vaccine. Patient has been advised to call insurance company to determine out of pocket expense if they have not yet received this vaccine. Advised may also receive vaccine at local pharmacy or Health Dept. Verbalized acceptance and understanding.  Screening Tests Health Maintenance  Topic Date Due  . OPHTHALMOLOGY EXAM  04/29/2020 (Originally 04/04/2019)  . HEMOGLOBIN A1C  04/29/2020  . COVID-19 Vaccine (4 - Booster) 06/19/2020  . FOOT EXAM  10/29/2020  . TETANUS/TDAP  04/06/2024  . INFLUENZA VACCINE  Completed  . DEXA SCAN  Completed  . Hepatitis C Screening  Completed  . PNA vac Low Risk Adult  Completed  . HPV VACCINES  Aged Out    Health Maintenance  There are no preventive care reminders to display for this patient.  Colorectal cancer screening: No longer required.   Mammogram status: No longer required due to age.  Bone Density status: Completed 04/09/14. Results reflect: Bone density results: NORMAL. Repeat every  2 years.  Additional Screening:  Hepatitis C Screening:  Completed 10/30/19  Vision Screening: Recommended annual ophthalmology exams for early detection of glaucoma and other disorders of the eye. Is the patient up to date with their annual eye exam?  Yes  Who is the provider or what is the name of the office in which the patient attends annual eye exams? Pt can't remember eye dr Name  If pt is not established with a provider, would they like to be referred to a provider to establish care? No .   Dental Screening: Recommended annual dental exams for proper  oral hygiene  Community Resource Referral / Chronic Care Management: CRR required this visit?  No   CCM required this visit?  No      Plan:     I have personally reviewed and noted the following in the patient's chart:   . Medical and social history . Use of alcohol, tobacco or illicit drugs  . Current medications and supplements . Functional ability and status . Nutritional status . Physical activity . Advanced directives . List of other physicians . Hospitalizations, surgeries, and ER visits in previous 12 months . Vitals . Screenings to include cognitive, depression, and falls . Referrals and appointments  In addition, I have reviewed and discussed with patient certain preventive protocols, quality metrics, and best practice recommendations. A written personalized care plan for preventive services as well as general preventive health recommendations were provided to patient.     [name], LPN   04/09/960   Nurse Notes: None

## 2020-03-10 NOTE — Patient Instructions (Addendum)
[name] , Thank you for taking time to come for your Medicare Wellness Visit. I appreciate your ongoing commitment to your health goals. Please review the following plan we discussed and let me know if I can assist you in the future.   Screening recommendations/referrals: Colonoscopy: No longer required  Mammogram: No longer required Bone Density: Done 04/09/14 Recommended yearly ophthalmology/optometry visit for glaucoma screening and checkup Recommended yearly dental visit for hygiene and checkup  Vaccinations: Influenza vaccine: Done 10/30/19 Pneumococcal vaccine: Up to date Tdap vaccine: Up to date Shingles vaccine: Shingrix discussed. Please contact your pharmacy for coverage information.    Covid-19:Completed 2/23 & 03/09/19  Advanced directives: Please bring a copy of your health care power of attorney and living will to the office at your convenience.   Conditions/risks identified: Stay Healthy  Next appointment: Follow up in one year for your annual wellness visit    Preventive Care 65 Years and Older, Female Preventive care refers to lifestyle choices and visits with your health care provider that can promote health and wellness. What does preventive care include?  A yearly physical exam. This is also called an annual well check.  Dental exams once or twice a year.  Routine eye exams. Ask your health care provider how often you should have your eyes checked.  Personal lifestyle choices, including:  Daily care of your teeth and gums.  Regular physical activity.  Eating a healthy diet.  Avoiding tobacco and drug use.  Limiting alcohol use.  Practicing safe sex.  Taking low-dose aspirin every day.  Taking vitamin and mineral supplements as recommended by your health care provider. What happens during an annual well check? The services and screenings done by your health care provider during your annual well check will depend on your age, overall health,  lifestyle risk factors, and family history of disease. Counseling  Your health care provider may ask you questions about your:  Alcohol use.  Tobacco use.  Drug use.  Emotional well-being.  Home and relationship well-being.  Sexual activity.  Eating habits.  History of falls.  Memory and ability to understand (cognition).  Work and work Astronomer.  Reproductive health. Screening  You may have the following tests or measurements:  Height, weight, and BMI.  Blood pressure.  Lipid and cholesterol levels. These may be checked every 5 years, or more frequently if you are over 44 years old.  Skin check.  Lung cancer screening. You may have this screening every year starting at age 53 if you have a 30-pack-year history of smoking and currently smoke or have quit within the past 15 years.  Fecal occult blood test (FOBT) of the stool. You may have this test every year starting at age 79.  Flexible sigmoidoscopy or colonoscopy. You may have a sigmoidoscopy every 5 years or a colonoscopy every 10 years starting at age 82.  Hepatitis C blood test.  Hepatitis B blood test.  Sexually transmitted disease (STD) testing.  Diabetes screening. This is done by checking your blood sugar (glucose) after you have not eaten for a while (fasting). You may have this done every 1-3 years.  Bone density scan. This is done to screen for osteoporosis. You may have this done starting at age 4.  Mammogram. This may be done every 1-2 years. Talk to your health care provider about how often you should have regular mammograms. Talk with your health care provider about your test results, treatment options, and if necessary, the need for more tests.  Vaccines  Your health care provider may recommend certain vaccines, such as:  Influenza vaccine. This is recommended every year.  Tetanus, diphtheria, and acellular pertussis (Tdap, Td) vaccine. You may need a Td booster every 10 years.  Zoster  vaccine. You may need this after age 24.  Pneumococcal 13-valent conjugate (PCV13) vaccine. One dose is recommended after age 47.  Pneumococcal polysaccharide (PPSV23) vaccine. One dose is recommended after age 37. Talk to your health care provider about which screenings and vaccines you need and how often you need them. This information is not intended to replace advice given to you by your health care provider. Make sure you discuss any questions you have with your health care provider. Document Released: 01/16/2015 Document Revised: 09/09/2015 Document Reviewed: 10/21/2014 Elsevier Interactive Patient Education  2017 [place] Prevention in the Home Falls can cause injuries. They can happen to people of all ages. There are many things you can do to make your home safe and to help prevent falls. What can I do on the outside of my home?  Regularly fix the edges of walkways and driveways and fix any cracks.  Remove anything that might make you trip as you walk through a door, such as a raised step or threshold.  Trim any bushes or trees on the path to your home.  Use bright outdoor lighting.  Clear any walking paths of anything that might make someone trip, such as rocks or tools.  Regularly check to see if handrails are loose or broken. Make sure that both sides of any steps have handrails.  Any raised decks and porches should have guardrails on the edges.  Have any leaves, snow, or ice cleared regularly.  Use sand or salt on walking paths during winter.  Clean up any spills in your garage right away. This includes oil or grease spills. What can I do in the bathroom?  Use night lights.  Install grab bars by the toilet and in the tub and shower. Do not use towel bars as grab bars.  Use non-skid mats or decals in the tub or shower.  If you need to sit down in the shower, use a plastic, non-slip stool.  Keep the floor dry. Clean up any water that spills on the  floor as soon as it happens.  Remove soap buildup in the tub or shower regularly.  Attach bath mats securely with double-sided non-slip rug tape.  Do not have throw rugs and other things on the floor that can make you trip. What can I do in the bedroom?  Use night lights.  Make sure that you have a light by your bed that is easy to reach.  Do not use any sheets or blankets that are too big for your bed. They should not hang down onto the floor.  Have a firm chair that has side arms. You can use this for support while you get dressed.  Do not have throw rugs and other things on the floor that can make you trip. What can I do in the kitchen?  Clean up any spills right away.  Avoid walking on wet floors.  Keep items that you use a lot in easy-to-reach places.  If you need to reach something above you, use a strong step stool that has a grab bar.  Keep electrical cords out of the way.  Do not use floor polish or wax that makes floors slippery. If you must use wax, use non-skid floor wax.  Do not have throw rugs and other things on the floor that can make you trip. What can I do with my stairs?  Do not leave any items on the stairs.  Make sure that there are handrails on both sides of the stairs and use them. Fix handrails that are broken or loose. Make sure that handrails are as long as the stairways.  Check any carpeting to make sure that it is firmly attached to the stairs. Fix any carpet that is loose or worn.  Avoid having throw rugs at the top or bottom of the stairs. If you do have throw rugs, attach them to the floor with carpet tape.  Make sure that you have a light switch at the top of the stairs and the bottom of the stairs. If you do not have them, ask someone to add them for you. What else can I do to help prevent falls?  Wear shoes that:  Do not have high heels.  Have rubber bottoms.  Are comfortable and fit you well.  Are closed at the toe. Do not wear  sandals.  If you use a stepladder:  Make sure that it is fully opened. Do not climb a closed stepladder.  Make sure that both sides of the stepladder are locked into place.  Ask someone to hold it for you, if possible.  Clearly mark and make sure that you can see:  Any grab bars or handrails.  First and last steps.  Where the edge of each step is.  Use tools that help you move around (mobility aids) if they are needed. These include:  Canes.  Walkers.  Scooters.  Crutches.  Turn on the lights when you go into a dark area. Replace any light bulbs as soon as they burn out.  Set up your furniture so you have a clear path. Avoid moving your furniture around.  If any of your floors are uneven, fix them.  If there are any pets around you, be aware of where they are.  Review your medicines with your doctor. Some medicines can make you feel dizzy. This can increase your chance of falling. Ask your doctor what other things that you can do to help prevent falls. This information is not intended to replace advice given to you by your health care provider. Make sure you discuss any questions you have with your health care provider. Document Released: 10/16/2008 Document Revised: 05/28/2015 Document Reviewed: 01/24/2014 Elsevier Interactive Patient Education  2017 [employer].

## 2020-03-24 ENCOUNTER — Other Ambulatory Visit: Payer: Self-pay

## 2020-03-24 ENCOUNTER — Ambulatory Visit (INDEPENDENT_AMBULATORY_CARE_PROVIDER_SITE_OTHER): Payer: Medicare Other | Admitting: *Deleted

## 2020-03-24 DIAGNOSIS — Z5181 Encounter for therapeutic drug level monitoring: Secondary | ICD-10-CM | POA: Diagnosis not present

## 2020-03-24 DIAGNOSIS — I2699 Other pulmonary embolism without acute cor pulmonale: Secondary | ICD-10-CM

## 2020-03-24 DIAGNOSIS — Z7901 Long term (current) use of anticoagulants: Secondary | ICD-10-CM

## 2020-03-24 LAB — POCT INR: INR: 3.4 — AB (ref 2.0–3.0)

## 2020-03-24 NOTE — Patient Instructions (Signed)
Description    - Continue taking 1/2 tablet daily except for 1 tablet on Mondays and Fridays.  - Recheck INR in 3 weeks.  Call with any new meds or procedures [phone].

## 2020-04-07 ENCOUNTER — Other Ambulatory Visit: Payer: Self-pay | Admitting: Family Medicine

## 2020-04-14 ENCOUNTER — Ambulatory Visit (INDEPENDENT_AMBULATORY_CARE_PROVIDER_SITE_OTHER): Payer: Medicare Other | Admitting: Pharmacist

## 2020-04-14 ENCOUNTER — Other Ambulatory Visit: Payer: Self-pay

## 2020-04-14 DIAGNOSIS — Z7901 Long term (current) use of anticoagulants: Secondary | ICD-10-CM

## 2020-04-14 DIAGNOSIS — I2699 Other pulmonary embolism without acute cor pulmonale: Secondary | ICD-10-CM

## 2020-04-14 DIAGNOSIS — Z5181 Encounter for therapeutic drug level monitoring: Secondary | ICD-10-CM

## 2020-04-14 LAB — POCT INR: INR: 3.5 — AB (ref 2.0–3.0)

## 2020-04-14 MED ORDER — WARFARIN SODIUM 5 MG PO TABS
ORAL_TABLET | ORAL | 0 refills | Status: DC
Start: 2020-04-14 — End: 2020-08-10

## 2020-04-14 NOTE — Patient Instructions (Signed)
Description   Continue taking 1/2 tablet daily except for 1 tablet on Mondays and Fridays.Recheck INR in 5 weeks.  Call with any new meds or procedures [phone].

## 2020-04-22 ENCOUNTER — Other Ambulatory Visit: Payer: Self-pay

## 2020-04-22 ENCOUNTER — Encounter (HOSPITAL_COMMUNITY): Payer: Self-pay | Admitting: Emergency Medicine

## 2020-04-22 ENCOUNTER — Emergency Department (HOSPITAL_COMMUNITY): Payer: Medicare Other

## 2020-04-22 ENCOUNTER — Inpatient Hospital Stay (HOSPITAL_COMMUNITY)
Admission: EM | Admit: 2020-04-22 | Discharge: 2020-04-26 | DRG: 378 | Disposition: A | Discharge status: Home Health | Payer: Medicare Other | Attending: Internal Medicine | Admitting: Internal Medicine

## 2020-04-22 DIAGNOSIS — D696 Thrombocytopenia, unspecified: Secondary | ICD-10-CM | POA: Diagnosis not present

## 2020-04-22 DIAGNOSIS — E1142 Type 2 diabetes mellitus with diabetic polyneuropathy: Secondary | ICD-10-CM | POA: Diagnosis not present

## 2020-04-22 DIAGNOSIS — E861 Hypovolemia: Secondary | ICD-10-CM | POA: Diagnosis present

## 2020-04-22 DIAGNOSIS — Z79890 Hormone replacement therapy (postmenopausal): Secondary | ICD-10-CM

## 2020-04-22 DIAGNOSIS — Z9049 Acquired absence of other specified parts of digestive tract: Secondary | ICD-10-CM

## 2020-04-22 DIAGNOSIS — I10 Essential (primary) hypertension: Secondary | ICD-10-CM | POA: Diagnosis present

## 2020-04-22 DIAGNOSIS — E1149 Type 2 diabetes mellitus with other diabetic neurological complication: Secondary | ICD-10-CM | POA: Diagnosis not present

## 2020-04-22 DIAGNOSIS — D72828 Other elevated white blood cell count: Secondary | ICD-10-CM | POA: Diagnosis present

## 2020-04-22 DIAGNOSIS — Z933 Colostomy status: Secondary | ICD-10-CM

## 2020-04-22 DIAGNOSIS — Z7901 Long term (current) use of anticoagulants: Secondary | ICD-10-CM

## 2020-04-22 DIAGNOSIS — Z86711 Personal history of pulmonary embolism: Secondary | ICD-10-CM | POA: Diagnosis present

## 2020-04-22 DIAGNOSIS — Z90710 Acquired absence of both cervix and uterus: Secondary | ICD-10-CM | POA: Diagnosis not present

## 2020-04-22 DIAGNOSIS — I5032 Chronic diastolic (congestive) heart failure: Secondary | ICD-10-CM | POA: Diagnosis present

## 2020-04-22 DIAGNOSIS — I130 Hypertensive heart and chronic kidney disease with heart failure and stage 1 through stage 4 chronic kidney disease, or unspecified chronic kidney disease: Secondary | ICD-10-CM | POA: Diagnosis not present

## 2020-04-22 DIAGNOSIS — I5020 Unspecified systolic (congestive) heart failure: Secondary | ICD-10-CM | POA: Diagnosis present

## 2020-04-22 DIAGNOSIS — I272 Pulmonary hypertension, unspecified: Secondary | ICD-10-CM | POA: Diagnosis present

## 2020-04-22 DIAGNOSIS — K5731 Diverticulosis of large intestine without perforation or abscess with bleeding: Principal | ICD-10-CM | POA: Diagnosis present

## 2020-04-22 DIAGNOSIS — Z79899 Other long term (current) drug therapy: Secondary | ICD-10-CM

## 2020-04-22 DIAGNOSIS — Z7984 Long term (current) use of oral hypoglycemic drugs: Secondary | ICD-10-CM | POA: Diagnosis not present

## 2020-04-22 DIAGNOSIS — Z86718 Personal history of other venous thrombosis and embolism: Secondary | ICD-10-CM

## 2020-04-22 DIAGNOSIS — Z20822 Contact with and (suspected) exposure to covid-19: Secondary | ICD-10-CM | POA: Diagnosis not present

## 2020-04-22 DIAGNOSIS — R109 Unspecified abdominal pain: Secondary | ICD-10-CM | POA: Diagnosis not present

## 2020-04-22 DIAGNOSIS — E785 Hyperlipidemia, unspecified: Secondary | ICD-10-CM | POA: Diagnosis not present

## 2020-04-22 DIAGNOSIS — R55 Syncope and collapse: Secondary | ICD-10-CM | POA: Diagnosis present

## 2020-04-22 DIAGNOSIS — E1140 Type 2 diabetes mellitus with diabetic neuropathy, unspecified: Secondary | ICD-10-CM

## 2020-04-22 DIAGNOSIS — E1122 Type 2 diabetes mellitus with diabetic chronic kidney disease: Secondary | ICD-10-CM | POA: Diagnosis not present

## 2020-04-22 DIAGNOSIS — K921 Melena: Secondary | ICD-10-CM | POA: Diagnosis not present

## 2020-04-22 DIAGNOSIS — E039 Hypothyroidism, unspecified: Secondary | ICD-10-CM | POA: Diagnosis present

## 2020-04-22 DIAGNOSIS — K922 Gastrointestinal hemorrhage, unspecified: Secondary | ICD-10-CM | POA: Diagnosis not present

## 2020-04-22 DIAGNOSIS — Z87891 Personal history of nicotine dependence: Secondary | ICD-10-CM | POA: Diagnosis not present

## 2020-04-22 DIAGNOSIS — D6959 Other secondary thrombocytopenia: Secondary | ICD-10-CM | POA: Diagnosis not present

## 2020-04-22 DIAGNOSIS — N1831 Chronic kidney disease, stage 3a: Secondary | ICD-10-CM | POA: Diagnosis not present

## 2020-04-22 DIAGNOSIS — K5730 Diverticulosis of large intestine without perforation or abscess without bleeding: Secondary | ICD-10-CM | POA: Diagnosis not present

## 2020-04-22 DIAGNOSIS — D500 Iron deficiency anemia secondary to blood loss (chronic): Secondary | ICD-10-CM | POA: Diagnosis not present

## 2020-04-22 DIAGNOSIS — I5042 Chronic combined systolic (congestive) and diastolic (congestive) heart failure: Secondary | ICD-10-CM | POA: Diagnosis present

## 2020-04-22 DIAGNOSIS — D62 Acute posthemorrhagic anemia: Secondary | ICD-10-CM | POA: Diagnosis not present

## 2020-04-22 LAB — CBC WITH DIFFERENTIAL/PLATELET
Abs Immature Granulocytes: 0.04 10*3/uL (ref 0.00–0.07)
Basophils Absolute: 0.1 10*3/uL (ref 0.0–0.1)
Basophils Relative: 1 %
Eosinophils Absolute: 0.2 10*3/uL (ref 0.0–0.5)
Eosinophils Relative: 2 %
HCT: 45.6 % (ref 36.0–46.0)
Hemoglobin: 13.9 g/dL (ref 12.0–15.0)
Immature Granulocytes: 0 %
Lymphocytes Relative: 41 %
Lymphs Abs: 3.8 10*3/uL (ref 0.7–4.0)
MCH: 28 pg (ref 26.0–34.0)
MCHC: 30.5 g/dL (ref 30.0–36.0)
MCV: 91.9 fL (ref 80.0–100.0)
Monocytes Absolute: 0.5 10*3/uL (ref 0.1–1.0)
Monocytes Relative: 5 %
Neutro Abs: 4.8 10*3/uL (ref 1.7–7.7)
Neutrophils Relative %: 51 %
Platelets: 248 10*3/uL (ref 150–400)
RBC: 4.96 MIL/uL (ref 3.87–5.11)
RDW: 16 % — ABNORMAL HIGH (ref 11.5–15.5)
WBC: 9.3 10*3/uL (ref 4.0–10.5)
nRBC: 0 % (ref 0.0–0.2)

## 2020-04-22 LAB — PROTIME-INR
INR: 3.3 — ABNORMAL HIGH (ref 0.8–1.2)
Prothrombin Time: 33.1 seconds — ABNORMAL HIGH (ref 11.4–15.2)

## 2020-04-22 LAB — COMPREHENSIVE METABOLIC PANEL
ALT: 13 U/L (ref 0–44)
AST: 19 U/L (ref 15–41)
Albumin: 3.4 g/dL — ABNORMAL LOW (ref 3.5–5.0)
Alkaline Phosphatase: 57 U/L (ref 38–126)
Anion gap: 9 (ref 5–15)
BUN: 23 mg/dL (ref 8–23)
CO2: 21 mmol/L — ABNORMAL LOW (ref 22–32)
Calcium: 9 mg/dL (ref 8.9–10.3)
Chloride: 106 mmol/L (ref 98–111)
Creatinine, Ser: 1.28 mg/dL — ABNORMAL HIGH (ref 0.44–1.00)
GFR, Estimated: 43 mL/min — ABNORMAL LOW (ref >60)
Glucose, Bld: 149 mg/dL — ABNORMAL HIGH (ref 70–99)
Potassium: 4.4 mmol/L (ref 3.5–5.1)
Sodium: 136 mmol/L (ref 135–145)
Total Bilirubin: 0.6 mg/dL (ref 0.3–1.2)
Total Protein: 6.4 g/dL — ABNORMAL LOW (ref 6.5–8.1)

## 2020-04-22 LAB — TROPONIN I (HIGH SENSITIVITY)
Troponin I (High Sensitivity): 5 ng/L (ref <18)
Troponin I (High Sensitivity): 7 ng/L (ref <18)

## 2020-04-22 LAB — POC OCCULT BLOOD, ED: Fecal Occult Bld: POSITIVE — AB

## 2020-04-22 LAB — CBC
HCT: 35.8 % — ABNORMAL LOW (ref 36.0–46.0)
Hemoglobin: 10.9 g/dL — ABNORMAL LOW (ref 12.0–15.0)
MCH: 28.2 pg (ref 26.0–34.0)
MCHC: 30.4 g/dL (ref 30.0–36.0)
MCV: 92.7 fL (ref 80.0–100.0)
Platelets: 102 10*3/uL — ABNORMAL LOW (ref 150–400)
RBC: 3.86 MIL/uL — ABNORMAL LOW (ref 3.87–5.11)
RDW: 16 % — ABNORMAL HIGH (ref 11.5–15.5)
WBC: 12 10*3/uL — ABNORMAL HIGH (ref 4.0–10.5)
nRBC: 0 % (ref 0.0–0.2)

## 2020-04-22 LAB — RESP PANEL BY RT-PCR (FLU A&B, COVID) ARPGX2
Influenza A by PCR: NEGATIVE
Influenza B by PCR: NEGATIVE
SARS Coronavirus 2 by RT PCR: NEGATIVE

## 2020-04-22 LAB — TYPE AND SCREEN
ABO/RH(D): O POS
Antibody Screen: NEGATIVE

## 2020-04-22 LAB — CBG MONITORING, ED: Glucose-Capillary: 186 mg/dL — ABNORMAL HIGH (ref 70–99)

## 2020-04-22 MED ORDER — VITAMIN K1 10 MG/ML IJ SOLN
10.0000 mg | Freq: Once | INTRAVENOUS | Status: AC
  Administered 2020-04-23: 10 mg via INTRAVENOUS
  Filled 2020-04-22: qty 1

## 2020-04-22 MED ORDER — IOHEXOL 300 MG/ML  SOLN
70.0000 mL | Freq: Once | INTRAMUSCULAR | Status: AC | PRN
  Administered 2020-04-22: 70 mL via INTRAVENOUS

## 2020-04-22 MED ORDER — SODIUM CHLORIDE 0.9 % IV BOLUS
500.0000 mL | Freq: Once | INTRAVENOUS | Status: AC
  Administered 2020-04-22: 500 mL via INTRAVENOUS

## 2020-04-22 NOTE — ED Triage Notes (Signed)
Emergency Medicine Provider Triage Evaluation Note  Hannah [name] , a 80 y.o. female  was evaluated in triage.  Pt complains of blood in stool.  Patient reports today she noticed dark red blood in her colostomy bag.  She has had this happen once before.  No associated abdominal pain.  Is currently on warfarin due to prior history of blood clots.  No vomiting  Review of Systems  Positive: Blood in stool Negative: Vomiting, abdominal pain  Physical Exam  BP (!) 153/92   Pulse 80   Temp 99.1 F (37.3 C)   Resp 17   SpO2 100%  Gen:   Awake, no distress   HEENT:  Atraumatic  Resp:  Normal effort  Cardiac:  Normal rate  Abd:   Nondistended, nontender, colostomy present with dark red blood noted MSK:   Moves extremities without difficulty  Neuro:  Speech clear   Medical Decision Making  Medically screening exam initiated at 5:20 PM.  Appropriate orders placed.  Fariha Goto was informed that the remainder of the evaluation will be completed by another provider, this initial triage assessment does not replace that evaluation, and the importance of remaining in the ED until their evaluation is complete.  Clinical Impression  1.  Blood in stool   Dartha Lodge, [place] 04/22/20 1724

## 2020-04-22 NOTE — ED Triage Notes (Signed)
Pt here from home with c/o blood in her colostomy bag that started today , hx of same , no abs pain , pt is on coumadin

## 2020-04-22 NOTE — ED Provider Notes (Signed)
[facility] EMERGENCY DEPARTMENT Provider Note   CSN: [id number] Arrival date & time: 04/22/20  1645     History No chief complaint on file. GI bleed  Hannah [name] is a 80 y.o. female.  The history is provided by the patient and medical records. No language interpreter was used.  Loss of Consciousness Episode history:  Single Most recent episode:  Today Duration:  5 minutes Timing:  Rare Progression:  Improving Chronicity:  New Context comment:  Gi bleeding from ostomy Witnessed: yes   Relieved by:  Nothing Worsened by:  Nothing Ineffective treatments:  None tried Associated symptoms: malaise/fatigue   Associated symptoms: no chest pain, no confusion, no diaphoresis, no difficulty breathing, no dizziness, no fever, no headaches, no nausea, no rectal bleeding (bleedign from ostomy), no seizures, no shortness of breath, no vomiting and no weakness        Past Medical History:  Diagnosis Date  . Acute massive pulmonary embolism ([place]) 07/13/2012   Massive PE w/ PEA arrest 07/13/12 >TNK >IVC filter >discharged on comadin    . Anticoagulated on warfarin   . Chronic diastolic CHF (congestive heart failure) ([place]) 03/15/2017  . Chronic kidney disease, stage 3a ([place]) 12/15/2018  . Diabetes mellitus, type 2 ([place]) 07/15/2012   with peripheral neuropathy  . DM2 (diabetes mellitus, type 2) ([place]) 07/23/2012  . Hyperlipemia 07/14/2012  . Hyperlipidemia 02/25/2013  . Hypertension 07/14/2012  . Hypothyroid 06/06/2013  . Large bowel stricture ([facility])    s/p colectomy in 2016  . Neuropathy 02/12/2015  . Osteoarthritis    s/p hip and knee replacements  . Pulmonary hypertension ([place]) 07/26/2016  . Syncope 10/2018  . Thyroid disease     Patient Active Problem List   Diagnosis Date Noted  . Coronary artery calcification seen on CT scan 06/24/2019  . Hypertension 04/25/2019  . Chronic kidney disease, stage 3a ([place]) 12/15/2018  . Syncope 10/30/2018  . Frequent falls 05/21/2018   . Encounter for therapeutic drug monitoring 05/07/2018  . Large bowel stricture ([facility]) 12/15/2017  . Chronic diastolic CHF (congestive heart failure) ([place]) 03/15/2017  . Anticoagulated on warfarin   . Pulmonary hypertension ([place]) 07/26/2016  . Neuropathy 02/12/2015  . Osteoarthritis 08/12/2014  . History of pulmonary embolism 06/10/2013  . Hypothyroid 06/06/2013  . Diabetes mellitus with neurological manifestations, controlled ([place]) 06/06/2013  . Hyperlipidemia 02/25/2013  . DM2 (diabetes mellitus, type 2) ([place]) 07/23/2012    Past Surgical History:  Procedure Laterality Date  . COLONOSCOPY N/A 03/12/2016   Procedure: COLONOSCOPY;  Surgeon: Teena Irani, MD;  Location: [facility] ENDOSCOPY;  Service: Endoscopy;  Laterality: N/A;  . COLOSTOMY N/A 06/03/2014   Procedure: COLOSTOMY;  Surgeon: Erroll Luna, MD;  Location: [facility];  Service: General;  Laterality: N/A;  . FLEXIBLE SIGMOIDOSCOPY N/A 05/30/2014   Procedure: Beryle Quant;  Surgeon: Carol Ada, MD;  Location[facility] ENDOSCOPY;  Service: Endoscopy;  Laterality: N/A;  . INSERTION OF VENA CAVA FILTER N/A 07/16/2012   Procedure: INSERTION OF VENA CAVA FILTER;  Surgeon: Elam Dutch, MD;  [facility] C[facility] LAB;  Service: Cardiovascular;  Laterality: N/A;  . KNEE ARTHROSCOPY    . PARTIAL COLECTOMY N/A 06/03/2014   Procedure: PARTIAL COLECTOMY;  Surgeon: Erroll Luna, MD;  Location: [facility];  Service: General;  Laterality: N/A;  . REDUCTION MAMMAPLASTY Bilateral   . SP ARTHRO HIP*L*       OB History   No obstetric history on file.     Family History  Problem Relation Age  of Onset  . Breast cancer Mother     Social History   Tobacco Use  . Smoking status: Former Smoker    Packs/day: 1.00    Years: 10.00    Pack years: 10.00    Types: Cigarettes    Quit date: 01/04/1968    Years since quitting: 52.3  . Smokeless tobacco: Never Used  Vaping Use  . Vaping Use: Never used  Substance Use Topics  . Alcohol use: No  . Drug  use: No    Home Medications Prior to Admission medications   Medication Sig Start Date End Date Taking? Authorizing Provider  acetaminophen (TYLENOL) 500 MG tablet Take 500 mg by mouth every 6 (six) hours as needed for mild pain.     [provider]  amLODipine (NORVASC) 2.5 MG tablet TAKE 1 TABLET BY MOUTH AT BEDTIME 01/31/20   Koberlein, Junell C, MD  Blood Glucose Monitoring Suppl (ONETOUCH VERIO) w/Device KIT 1 each by Does not apply route as directed. 06/07/18   Koberlein, Steele Berg, MD  Cyanocobalamin (VITAMIN B-12 PO) Take 1 tablet by mouth daily.    [provider]  diclofenac Sodium (VOLTAREN) 1 % GEL Apply 4 g topically 4 (four) times daily. 06/26/19   Lamptey, Myrene Galas, MD  EUTHYROX 25 MCG tablet TAKE 1 TABLET BY MOUTH ONCE DAILY BEFORE BREAKFAST 11/25/19   Koberlein, Steele Berg, MD  furosemide (LASIX) 20 MG tablet Take 1 tablet (20 mg total) by mouth daily as needed for fluid or edema. 06/28/18   Colin Benton R, DO  glucose blood (ONETOUCH VERIO) test strip USE AS DIRECTED ONCE  A  DAY 02/08/20   Micheline Rough C, MD  lisinopril (ZESTRIL) 40 MG tablet Take 1 tablet by mouth once daily 11/25/19   Koberlein, Steele Berg, MD  metFORMIN (GLUCOPHAGE-XR) 500 MG 24 hr tablet Take 1 tablet by mouth once daily with breakfast 04/07/20   [name], MD  metoprolol tartrate (LOPRESSOR) 25 MG tablet Take 1 tablet by mouth twice daily 02/19/20   Koberlein, Steele Berg, MD  OneTouch Delica Lancets 12I MISC USE 1  TO CHECK GLUCOSE ONCE DAILY 07/02/18   Koberlein, Steele Berg, MD  polyethylene glycol (MIRALAX / GLYCOLAX) packet Take 17 g by mouth daily. 03/19/17   Elodia Florence., MD  simvastatin (ZOCOR) 20 MG tablet Take 1 tablet (20 mg total) by mouth at bedtime. 02/26/20   [name], MD  traMADol (ULTRAM) 50 MG tablet Take 1 tablet (50 mg total) by mouth at bedtime as needed. Patient not taking: Reported on 03/10/2020 06/26/19   Chase Picket, MD  vitamin C (ASCORBIC ACID)  500 MG tablet Take 500 mg by mouth daily.     [provider]  warfarin (COUMADIN) 5 MG tablet TAKE 1/2 TO 1 (ONE-HALF TO ONE) TABLET BY MOUTH DAILY AS DIRECTED BY  COUMADIN  CLINIC 04/14/20   Fay Records, MD    Allergies    Augmentin [amoxicillin-pot clavulanate], Tranxene [clorazepate], and Penicillins  Review of Systems   Review of Systems  Constitutional: Positive for fatigue and malaise/fatigue. Negative for chills, diaphoresis and fever.  HENT: Negative for congestion.   Eyes: Negative for visual disturbance.  Respiratory: Negative for cough, chest tightness and shortness of breath.   Cardiovascular: Positive for syncope. Negative for chest pain.  Gastrointestinal: Positive for abdominal pain (yesterday). Negative for constipation, diarrhea, nausea and vomiting.  Genitourinary: Negative for dysuria and flank pain.  Musculoskeletal: Negative for back pain,  neck pain and neck stiffness.  Skin: Negative for rash and wound.  Neurological: Positive for syncope and light-headedness. Negative for dizziness, seizures, weakness, numbness and headaches.  Psychiatric/Behavioral: Negative for agitation and confusion.  All other systems reviewed and are negative.   Physical Exam Updated Vital Signs BP 139/80   Pulse 71   Temp 99.1 F (37.3 C)   Resp 20   SpO2 100%   Physical Exam Vitals and nursing note reviewed.  Constitutional:      General: She is not in acute distress.    Appearance: She is well-developed. She is not ill-appearing, toxic-appearing or diaphoretic.  HENT:     Head: Normocephalic and atraumatic.     Mouth/Throat:     Mouth: Mucous membranes are dry.     Pharynx: No oropharyngeal exudate or posterior oropharyngeal erythema.  Eyes:     Conjunctiva/sclera: Conjunctivae normal.  Cardiovascular:     Rate and Rhythm: Normal rate and regular rhythm.     Heart sounds: No murmur heard.   Pulmonary:     Effort: Pulmonary effort is normal. No respiratory  distress.     Breath sounds: Normal breath sounds.  Abdominal:     General: Abdomen is flat. A surgical scar is present. Bowel sounds are normal.     Palpations: Abdomen is soft.     Tenderness: There is no abdominal tenderness. There is no right CVA tenderness, left CVA tenderness, guarding or rebound.       Comments: Ostomy in place   Musculoskeletal:        General: No tenderness.     Cervical back: Neck supple.  Skin:    General: Skin is warm and dry.     Capillary Refill: Capillary refill takes less than 2 seconds.     Findings: No erythema.  Neurological:     General: No focal deficit present.     Mental Status: She is alert.  Psychiatric:        Mood and Affect: Mood normal.     ED Results / Procedures / Treatments   Labs (all labs ordered are listed, but only abnormal results are displayed) Labs Reviewed  COMPREHENSIVE METABOLIC PANEL - Abnormal; Notable for the following components:      Result Value   CO2 21 (*)    Glucose, Bld 149 (*)    Creatinine, Ser 1.28 (*)    Total Protein 6.4 (*)    Albumin 3.4 (*)    GFR, Estimated 43 (*)    All other components within normal limits  CBC WITH DIFFERENTIAL/PLATELET - Abnormal; Notable for the following components:   RDW 16.0 (*)    All other components within normal limits  PROTIME-INR - Abnormal; Notable for the following components:   Prothrombin Time 33.1 (*)    INR 3.3 (*)    All other components within normal limits  CBC - Abnormal; Notable for the following components:   WBC 12.0 (*)    RBC 3.86 (*)    Hemoglobin 10.9 (*)    HCT 35.8 (*)    RDW 16.0 (*)    Platelets 102 (*)    All other components within normal limits  POC OCCULT BLOOD, ED - Abnormal; Notable for the following components:   Fecal Occult Bld POSITIVE (*)    All other components within normal limits  CBG MONITORING, ED - Abnormal; Notable for the following components:   Glucose-Capillary 186 (*)    All other components within normal  limits  RESP PANEL BY RT-PCR (FLU A&B, COVID) ARPGX2  TYPE AND SCREEN  TROPONIN I (HIGH SENSITIVITY)  TROPONIN I (HIGH SENSITIVITY)    EKG EKG Interpretation  Date/Time:  Wednesday April 22 2020 18:22:55 EDT Ventricular Rate:  65 PR Interval:  163 QRS Duration: 128 QT Interval:  417 QTC Calculation: 434 R Axis:   -65 Text Interpretation: Sinus rhythm Nonspecific IVCD with LAD Left ventricular hypertrophy Anterior Q waves, possibly due to LVH When compared to prior, similar appearance. No STEMI Confirmed by Antony Blackbird [phone]) on 04/22/2020 7:01:00 PM   Radiology CT ABDOMEN PELVIS W CONTRAST  Result Date: 04/22/2020 CLINICAL DATA:  Abdominal pain and GI bleeding from ostomy EXAM: CT ABDOMEN AND PELVIS WITH CONTRAST TECHNIQUE: Multidetector CT imaging of the abdomen and pelvis was performed using the standard protocol following bolus administration of intravenous contrast. CONTRAST:  62m OMNIPAQUE IOHEXOL 300 MG/ML  SOLN COMPARISON:  10/24/2017 FINDINGS: Lower chest: Lung bases are free of acute infiltrate or sizable effusion. Mild scarring is noted in the bases bilaterally. Hepatobiliary: Fatty infiltration of the liver is seen. Gallbladder is decompressed. Pancreas: Pancreas is within normal limits. Spleen: Normal in size without focal abnormality. Adrenals/Urinary Tract: Adrenal glands are unremarkable. Kidneys demonstrate a normal enhancement pattern bilaterally. Upper pole renal cyst is noted on the right. This is stable from the prior exam. Excretion of contrast is noted bilaterally. The bladder is partially distended. Stomach/Bowel: Hartmann's pouch is noted and stable. The appendix is within normal limits. Residual colon is unremarkable with a left-sided colostomy identified. Peri stomal herniation of small bowel loops is noted although no incarceration or obstructive changes seen. Stomach and small bowel are otherwise within normal limits. Vascular/Lymphatic: IVC filter is noted in  place. Atherosclerotic calcifications of the abdominal aorta are noted. No aneurysmal dilatation is seen. No lymphadenopathy is noted. Reproductive: Status post hysterectomy. No adnexal masses. Other: No abdominal wall hernia or abnormality. No abdominopelvic ascites. Musculoskeletal: Postsurgical changes are noted in the left hip. Degenerative changes of lumbar spine are noted. IMPRESSION: No findings to suggest active GI hemorrhage. Left-sided colostomy with peristomal herniation of small bowel loops although no incarceration is noted. Fatty liver. No acute abnormality is noted. Electronically Signed   By: MInez CatalinaM.D.   On: 04/22/2020 21:14    Procedures Procedures   CRITICAL CARE Performed by: CGwenyth AllegraTegeler Total critical care time: 35 minutes Critical care time was exclusive of separately billable procedures and treating other patients. GI bleed with reversal with IV vitamin K.  Will see GI in the morning and admitted for GI bleed with syncope Critical care was necessary to treat or prevent imminent or life-threatening deterioration. Critical care was time spent personally by me on the following activities: development of treatment plan with patient and/or surrogate as well as nursing, discussions with consultants, evaluation of patient's response to treatment, examination of patient, obtaining history from patient or surrogate, ordering and performing treatments and interventions, ordering and review of laboratory studies, ordering and review of radiographic studies, pulse oximetry and re-evaluation of patient's condition.   Medications Ordered in ED Medications  phytonadione (VITAMIN K) 10 mg in dextrose 5 % 50 mL IVPB (10 mg Intravenous New Bag/Given 04/23/20 0004)  sodium chloride 0.9 % bolus 500 mL (500 mLs Intravenous Bolus 04/22/20 1824)  iohexol (OMNIPAQUE) 300 MG/ML solution 70 mL (70 mLs Intravenous Contrast Given 04/22/20 2101)    ED Course  I have reviewed the triage  vital signs and the nursing notes.  Pertinent  labs & imaging results that were available during my care of the patient were reviewed by me and considered in my medical decision making (see chart for details).    MDM Rules/Calculators/A&P                          Hannah [name] is a 80 y.o. female with a past medical history significant for hypertension, hyperlipidemia, diabetes, prior pulmonary embolism on Coumadin therapy, CHF, CKD, and prior large bowel stricture status post colostomy who presents with GI bleed and had a syncopal episode in triage.  According to patient, she has as of abdominal pain yesterday that was moderate in her central abdomen.  She reports no nausea or vomiting.  She says that today, she started having dark blood come from her colostomy bag.  She reports the abdominal pain has slightly improved but she is been feeling fatigued and lightheaded today.  As she was getting assessed in triage, she reportedly had a syncopal episode and was out for several minutes.  She had thready pulses and they could not get a blood pressure during that episode.  Patient then came to and had no postictal period.  Patient is still feeling fatigued but denies any pain at this time.  She denies recent fevers, chills, chest pain, shortness of breath, or urinary changes.  She denies any trauma.  She does not have any bowel movements on her bottom, only from her ostomy.  On exam, patient has dark blood like liquid pouring from her ostomy.  Patient has no significant abdominal tenderness on my exam and there were active bowel sounds.  Lungs were clear and chest was nontender.  Patient resting comfortably and blood pressure was normotensive on my assessment.  Patient will have EKG and will be given some fluids for the syncopal episode.  Will check labs as I am concerned about ongoing GI bleed on Coumadin therapy.  Will check INR.  We will get a COVID test as anticipate she will need admission given the  syncopal episode in the setting of GI bleed and history of CHF as well.  Anticipate reassessment and likely admission after work-up is completed.  Due to the abdominal pain she had yesterday with this GI bleed now and history of obstruction, will get a CT scan.  CT scan showed no findings to suggest active GI hemorrhage and there was also some periosteal herniation of small bowel loops with no incarceration.  No other acute abnormality seen.  Patient continued to have dark blood coming out of her ostomy.  We changed the bag once.  On a repeat hemoglobin, her hemoglobin dropped 3 points from 13 9-10.9.  Given her syncopal episode, her three-point drop in hemoglobin while she has been here, and the GI bleed on Coumadin therapy, will call GI and admit to medicine.  Chart review shows that patient is on Coumadin because of prior PE and she does still have an IVC filter in place.  Anticipate reversal and admission to medicine for GI evaluation tomorrow.  As her vital signs remain reassuring now, will hold on transfusion unless it continues to decline precipitously or she becomes unstable.  11:22 PM Just spoke to [facility] gastroenterology on-call team who agrees with admission to medicine.  They do want her remain n.p.o. as she will likely get scoped tomorrow.    He felt it was reasonable to consider vitamin K reversal. patient otherwise has been normotensive since she has been in  the emergency department.  She does have an IVC filter in place.  Will reverse with IV vitamin K given the protocol for critical bleed but hemodynamically stable at this time.  Medicine team will be called for admission.   Final Clinical Impression(s) / ED Diagnoses Final diagnoses:  Gastrointestinal hemorrhage, unspecified gastrointestinal hemorrhage type  Syncope, unspecified syncope type     Clinical Impression: 1. Gastrointestinal hemorrhage, unspecified gastrointestinal hemorrhage type   2. Syncope, unspecified  syncope type     Disposition: Admit  This note was prepared with assistance of Dragon voice recognition software. Occasional wrong-word or sound-a-like substitutions may have occurred due to the inherent limitations of voice recognition software.     Romayne Ticas, Gwenyth Allegra, MD 04/23/20 [phone]

## 2020-04-23 ENCOUNTER — Other Ambulatory Visit: Payer: Self-pay

## 2020-04-23 ENCOUNTER — Inpatient Hospital Stay (HOSPITAL_COMMUNITY): Payer: Medicare Other

## 2020-04-23 ENCOUNTER — Encounter (HOSPITAL_COMMUNITY): Payer: Self-pay | Admitting: Family Medicine

## 2020-04-23 DIAGNOSIS — N1831 Chronic kidney disease, stage 3a: Secondary | ICD-10-CM | POA: Diagnosis not present

## 2020-04-23 DIAGNOSIS — R55 Syncope and collapse: Secondary | ICD-10-CM

## 2020-04-23 DIAGNOSIS — I5032 Chronic diastolic (congestive) heart failure: Secondary | ICD-10-CM | POA: Diagnosis not present

## 2020-04-23 DIAGNOSIS — K922 Gastrointestinal hemorrhage, unspecified: Secondary | ICD-10-CM | POA: Diagnosis not present

## 2020-04-23 DIAGNOSIS — E1140 Type 2 diabetes mellitus with diabetic neuropathy, unspecified: Secondary | ICD-10-CM | POA: Diagnosis not present

## 2020-04-23 DIAGNOSIS — D696 Thrombocytopenia, unspecified: Secondary | ICD-10-CM | POA: Diagnosis present

## 2020-04-23 LAB — BASIC METABOLIC PANEL
Anion gap: 6 (ref 5–15)
BUN: 20 mg/dL (ref 8–23)
CO2: 22 mmol/L (ref 22–32)
Calcium: 8.3 mg/dL — ABNORMAL LOW (ref 8.9–10.3)
Chloride: 110 mmol/L (ref 98–111)
Creatinine, Ser: 1.16 mg/dL — ABNORMAL HIGH (ref 0.44–1.00)
GFR, Estimated: 48 mL/min — ABNORMAL LOW (ref >60)
Glucose, Bld: 151 mg/dL — ABNORMAL HIGH (ref 70–99)
Potassium: 4.4 mmol/L (ref 3.5–5.1)
Sodium: 138 mmol/L (ref 135–145)

## 2020-04-23 LAB — GLUCOSE, CAPILLARY
Glucose-Capillary: 162 mg/dL — ABNORMAL HIGH (ref 70–99)
Glucose-Capillary: 119 mg/dL — ABNORMAL HIGH (ref 70–99)
Glucose-Capillary: 132 mg/dL — ABNORMAL HIGH (ref 70–99)
Glucose-Capillary: 184 mg/dL — ABNORMAL HIGH (ref 70–99)
Glucose-Capillary: 109 mg/dL — ABNORMAL HIGH (ref 70–99)

## 2020-04-23 LAB — CBC
HCT: 37 % (ref 36.0–46.0)
HCT: 35.3 % — ABNORMAL LOW (ref 36.0–46.0)
HCT: 31.5 % — ABNORMAL LOW (ref 36.0–46.0)
HCT: 31.1 % — ABNORMAL LOW (ref 36.0–46.0)
Hemoglobin: 11.2 g/dL — ABNORMAL LOW (ref 12.0–15.0)
Hemoglobin: 10.9 g/dL — ABNORMAL LOW (ref 12.0–15.0)
Hemoglobin: 9.8 g/dL — ABNORMAL LOW (ref 12.0–15.0)
Hemoglobin: 9.6 g/dL — ABNORMAL LOW (ref 12.0–15.0)
MCH: 27.9 pg (ref 26.0–34.0)
MCH: 28 pg (ref 26.0–34.0)
MCH: 28.5 pg (ref 26.0–34.0)
MCH: 28.3 pg (ref 26.0–34.0)
MCHC: 30.3 g/dL (ref 30.0–36.0)
MCHC: 30.9 g/dL (ref 30.0–36.0)
MCHC: 31.1 g/dL (ref 30.0–36.0)
MCHC: 30.9 g/dL (ref 30.0–36.0)
MCV: 92.3 fL (ref 80.0–100.0)
MCV: 90.7 fL (ref 80.0–100.0)
MCV: 91.6 fL (ref 80.0–100.0)
MCV: 91.7 fL (ref 80.0–100.0)
Platelets: 204 10*3/uL (ref 150–400)
Platelets: 210 10*3/uL (ref 150–400)
Platelets: 185 10*3/uL (ref 150–400)
Platelets: 184 10*3/uL (ref 150–400)
RBC: 4.01 MIL/uL (ref 3.87–5.11)
RBC: 3.89 MIL/uL (ref 3.87–5.11)
RBC: 3.44 MIL/uL — ABNORMAL LOW (ref 3.87–5.11)
RBC: 3.39 MIL/uL — ABNORMAL LOW (ref 3.87–5.11)
RDW: 15.9 % — ABNORMAL HIGH (ref 11.5–15.5)
RDW: 16.1 % — ABNORMAL HIGH (ref 11.5–15.5)
RDW: 16.2 % — ABNORMAL HIGH (ref 11.5–15.5)
RDW: 16.2 % — ABNORMAL HIGH (ref 11.5–15.5)
WBC: 12.1 10*3/uL — ABNORMAL HIGH (ref 4.0–10.5)
WBC: 11.6 10*3/uL — ABNORMAL HIGH (ref 4.0–10.5)
WBC: 10.7 10*3/uL — ABNORMAL HIGH (ref 4.0–10.5)
WBC: 15.9 10*3/uL — ABNORMAL HIGH (ref 4.0–10.5)
nRBC: 0 % (ref 0.0–0.2)
nRBC: 0 % (ref 0.0–0.2)
nRBC: 0 % (ref 0.0–0.2)
nRBC: 0 % (ref 0.0–0.2)

## 2020-04-23 LAB — ECHOCARDIOGRAM COMPLETE
Area-P 1/2: 1.62 cm2
Height: 67 in
S' Lateral: 2.5 cm
Weight: 2895.96 oz

## 2020-04-23 LAB — PROTIME-INR
INR: 1.7 — ABNORMAL HIGH (ref 0.8–1.2)
Prothrombin Time: 20.1 seconds — ABNORMAL HIGH (ref 11.4–15.2)

## 2020-04-23 LAB — HEMOGLOBIN A1C
Hgb A1c MFr Bld: 6.4 % — ABNORMAL HIGH (ref 4.8–5.6)
Mean Plasma Glucose: 136.98 mg/dL

## 2020-04-23 LAB — HEMOGLOBIN AND HEMATOCRIT, BLOOD
HCT: 29.3 % — ABNORMAL LOW (ref 36.0–46.0)
Hemoglobin: 9 g/dL — ABNORMAL LOW (ref 12.0–15.0)

## 2020-04-23 MED ORDER — ONDANSETRON HCL 4 MG PO TABS: 4.0000 mg | ORAL_TABLET | Freq: Four times a day (QID) | ORAL | Status: DC | PRN

## 2020-04-23 MED ORDER — ACETAMINOPHEN 325 MG PO TABS
650.0000 mg | ORAL_TABLET | Freq: Four times a day (QID) | ORAL | Status: DC | PRN
  Administered 2020-04-23 – 2020-04-25 (×3): 650 mg via ORAL
  Filled 2020-04-23 (×3): qty 2

## 2020-04-23 MED ORDER — ACETAMINOPHEN 650 MG RE SUPP: 650.0000 mg | Freq: Four times a day (QID) | RECTAL | Status: DC | PRN

## 2020-04-23 MED ORDER — ONDANSETRON HCL 4 MG/2ML IJ SOLN: 4.0000 mg | Freq: Four times a day (QID) | INTRAMUSCULAR | Status: DC | PRN

## 2020-04-23 MED ORDER — PANTOPRAZOLE SODIUM 40 MG IV SOLR
40.0000 mg | Freq: Two times a day (BID) | INTRAVENOUS | Status: DC
  Administered 2020-04-23 – 2020-04-25 (×6): 40 mg via INTRAVENOUS
  Filled 2020-04-23 (×6): qty 40

## 2020-04-23 MED ORDER — SODIUM CHLORIDE 0.9 % IV SOLN: INTRAVENOUS | Status: AC

## 2020-04-23 MED ORDER — INSULIN ASPART 100 UNIT/ML ~~LOC~~ SOLN
0.0000 [IU] | SUBCUTANEOUS | Status: DC
  Administered 2020-04-23: 1 [IU] via SUBCUTANEOUS
  Administered 2020-04-23 (×2): 2 [IU] via SUBCUTANEOUS
  Administered 2020-04-24: 1 [IU] via SUBCUTANEOUS
  Administered 2020-04-24: 2 [IU] via SUBCUTANEOUS
  Administered 2020-04-24 (×2): 1 [IU] via SUBCUTANEOUS
  Administered 2020-04-25 (×2): 2 [IU] via SUBCUTANEOUS

## 2020-04-23 MED ORDER — FENTANYL CITRATE (PF) 100 MCG/2ML IJ SOLN: 12.5000 ug | INTRAMUSCULAR | Status: DC | PRN

## 2020-04-23 NOTE — Progress Notes (Signed)
PROGRESS NOTE    Hannah [name]  LNL:[id number] DOB: 1940/11/27 DOA: 04/22/2020 PCP: Wynn Banker, MD    No chief complaint on file.   Brief Narrative:    Hannah [name] is a 80 y.o. female with medical history significant for type 2 diabetes mellitus, hypertension, chronic kidney disease stage IIIb, history of DVT and massive PE with PEA arrest in 2014 now on warfarin, partial colectomy in 2016, and diverticulosis, now presenting to the emergency department for evaluation of blood in her colostomy bag.  Patient denies any abdominal pain but had some vague abdominal discomfort yesterday followed by appearance of dark red blood in her colostomy bag.  She suffered a brief syncopal episode in triage.  Had no acute findings on CT abdomen/pelvis. Gastroenterology was consulted by the ED physician, type and screen was performed, and the patient was given 500 cc of saline and 10 mg IV vitamin K.   Assessment & Plan:   Principal Problem:   Acute GI bleeding Active Problems:   Diabetes mellitus with neurological manifestations, controlled (HCC)   History of pulmonary embolism   Chronic diastolic CHF (congestive heart failure) (HCC)   Syncope   Chronic kidney disease, stage 3a (HCC)   Hypertension   Thrombocytopenia (HCC)    Acute GI bleed probably diverticular bleed.  Baseline hemoglobin around 13, dropped to 10.9 to 9.8.  Probably diverticular bleed.  GI consulted,auggested probably a diverticular bleed and being on coumadin.  Clear liquid diet. Continue with PPI IV To consider nuclear bleeding scan, if positive, IR angiogram vs repeat colonoscopy via her ostomy.  Transfuse to keep hemoglobin greater than 7.     Diabetes mellitus type 2 , non insulin dependent, with stage 3 a ckd.  CBG (last 3)  Recent Labs    04/23/20 0252 04/23/20 0753 04/23/20 1222  GLUCAP 162* 119* 132*   Resume SSI.     H/o DVT and PE:  Was on coumadin at home, which is being held for GI bleed.     Essential hypertension; Well controlled.    transient LOC .Hannah [name] on admission: Get orthostatic vital signs.  Probably from GI blood loss.  Get echocardiogram.   Stage 3 a CKD:  Creatinine at baseline.    Mild leukocytosis:  - reactive.    Chronic diastolic CHF;  She appears compensated.  Holding diuretics.   Thrombocytopenia Resolved.      DVT prophylaxis:SCD'S  Code Status: FULL CODE Family Communication:  None at bedside Disposition:   Status is: Inpatient  Remains inpatient appropriate because:Ongoing diagnostic testing needed not appropriate for outpatient work up and IV treatments appropriate due to intensity of illness or inability to take PO   Dispo: The patient is from: Home              Anticipated d/c is to: pending              Patient currently is not medically stable to d/c.   Difficult to place patient No       Consultants:   gi   Procedures: none    Antimicrobials: none   Subjective: No new complaints  Objective: Vitals:   04/23/20 0130 04/23/20 0220 04/23/20 0445 04/23/20 0754  BP: 98/60 115/76 94/61 103/64  Pulse: 72 80 78 74  Resp: 13 16 18 16   Temp:  97.9 F (36.6 C) 98.1 F (36.7 C) 98.3 F (36.8 C)  TempSrc:  Oral Oral Oral  SpO2: 98% 98% 97%   Weight:  80.7  kg 82.1 kg   Height:  5\' 7"  (1.702 m)      Intake/Output Summary (Last 24 hours) at 04/23/2020 0850 Last data filed at 04/23/2020 0600 Gross per 24 hour  Intake 338.12 ml  Output 300 ml  Net 38.12 ml   Filed Weights   04/23/20 0220 04/23/20 0445  Weight: 80.7 kg 82.1 kg    Examination:  General exam: Appears calm and comfortable  Respiratory system: Clear to auscultation. Respiratory effort normal. Cardiovascular system: S1 & S2 heard, RRR. No pedal edema. Gastrointestinal system: Abdomen is nondistended, soft and nontender. Normal bowel sounds heard. Central nervous system: Alert and oriented. No focal neurological deficits. Extremities:  Symmetric 5 x 5 power. Skin: No rashes, lesions or ulcers Psychiatry:  Mood & affect appropriate.     Data Reviewed: I have personally reviewed following labs and imaging studies  CBC: Recent Labs  Lab 04/22/20 1723 04/22/20 2132 04/23/20 0000 04/23/20 0440  WBC 9.3 12.0* 12.1* 11.6*  NEUTROABS 4.8  --   --   --   HGB 13.9 10.9* 11.2* 10.9*  HCT 45.6 35.8* 37.0 35.3*  MCV 91.9 92.7 92.3 90.7  PLT 248 102* 204 210    Basic Metabolic Panel: Recent Labs  Lab 04/22/20 1723 04/23/20 0440  NA 136 138  K 4.4 4.4  CL 106 110  CO2 21* 22  GLUCOSE 149* 151*  BUN 23 20  CREATININE 1.28* 1.16*  CALCIUM 9.0 8.3*    GFR: Estimated Creatinine Clearance: 44 mL/min (A) (by C-G formula based on SCr of 1.16 mg/dL (H)).  Liver Function Tests: Recent Labs  Lab 04/22/20 1723  AST 19  ALT 13  ALKPHOS 57  BILITOT 0.6  PROT 6.4*  ALBUMIN 3.4*    CBG: Recent Labs  Lab 04/22/20 1738 04/23/20 0252 04/23/20 0753  GLUCAP 186* 162* 119*     Recent Results (from the past 240 hour(s))  Resp Panel by RT-PCR (Flu A&B, Covid) Nasopharyngeal Swab     Status: None   Collection Time: 04/22/20  6:20 PM   Specimen: Nasopharyngeal Swab; Nasopharyngeal(NP) swabs in vial transport medium  Result Value Ref Range Status   SARS Coronavirus 2 by RT PCR NEGATIVE NEGATIVE Final    Comment: (NOTE) SARS-CoV-2 target nucleic acids are NOT DETECTED.  The SARS-CoV-2 RNA is generally detectable in upper respiratory specimens during the acute phase of infection. The lowest concentration of SARS-CoV-2 viral copies this assay can detect is 138 copies/mL. A negative result does not preclude SARS-Cov-2 infection and should not be used as the sole basis for treatment or other patient management decisions. A negative result may occur with  improper specimen collection/handling, submission of specimen other than nasopharyngeal swab, presence of viral mutation(s) within the areas targeted by this  assay, and inadequate number of viral copies(<138 copies/mL). A negative result must be combined with clinical observations, patient history, and epidemiological information. The expected result is Negative.  Fact Sheet for Patients:  04/24/20  Fact Sheet for Healthcare Providers:  BloggerCourse.com  This test is no t yet approved or cleared by the [employer] FDA and  has been authorized for detection and/or diagnosis of SARS-CoV-2 by FDA under an Emergency Use Authorization (EUA). This EUA will remain  in effect (meaning this test can be used) for the duration of the COVID-19 declaration under Section 564(b)(1) of the Act, 21 U.S.C.section 360bbb-3(b)(1), unless the authorization is terminated  or revoked sooner.       Influenza A by PCR NEGATIVE  NEGATIVE Final   Influenza B by PCR NEGATIVE NEGATIVE Final    Comment: (NOTE) The Xpert Xpress SARS-CoV-2/FLU/RSV plus assay is intended as an aid in the diagnosis of influenza from Nasopharyngeal swab specimens and should not be used as a sole basis for treatment. Nasal washings and aspirates are unacceptable for Xpert Xpress SARS-CoV-2/FLU/RSV testing.  Fact Sheet for Patients: BloggerCourse.com  Fact Sheet for Healthcare Providers: [employer]  This test is not yet approved or cleared by the [place] FDA and has been authorized for detection and/or diagnosis of SARS-CoV-2 by FDA under an Emergency Use Authorization (EUA). This EUA will remain in effect (meaning this test can be used) for the duration of the COVID-19 declaration under Section 564(b)(1) of the Act, 21 U.S.C. section 360bbb-3(b)(1), unless the authorization is terminated or revoked.  Performed at [facility], [address], [place], [place] [address]          Radiology Studies: CT ABDOMEN PELVIS W CONTRAST  Result Date:  04/22/2020 CLINICAL DATA:  Abdominal pain and GI bleeding from ostomy EXAM: CT ABDOMEN AND PELVIS WITH CONTRAST TECHNIQUE: Multidetector CT imaging of the abdomen and pelvis was performed using the standard protocol following bolus administration of intravenous contrast. CONTRAST:  12mL OMNIPAQUE IOHEXOL 300 MG/ML  SOLN COMPARISON:  10/24/2017 FINDINGS: Lower chest: Lung bases are free of acute infiltrate or sizable effusion. Mild scarring is noted in the bases bilaterally. Hepatobiliary: Fatty infiltration of the liver is seen. Gallbladder is decompressed. Pancreas: Pancreas is within normal limits. Spleen: Normal in size without focal abnormality. Adrenals/Urinary Tract: Adrenal glands are unremarkable. Kidneys demonstrate a normal enhancement pattern bilaterally. Upper pole renal cyst is noted on the right. This is stable from the prior exam. Excretion of contrast is noted bilaterally. The bladder is partially distended. Stomach/Bowel: Hartmann's pouch is noted and stable. The appendix is within normal limits. Residual colon is unremarkable with a left-sided colostomy identified. Peri stomal herniation of small bowel loops is noted although no incarceration or obstructive changes seen. Stomach and small bowel are otherwise within normal limits. Vascular/Lymphatic: IVC filter is noted in place. Atherosclerotic calcifications of the abdominal aorta are noted. No aneurysmal dilatation is seen. No lymphadenopathy is noted. Reproductive: Status post hysterectomy. No adnexal masses. Other: No abdominal wall hernia or abnormality. No abdominopelvic ascites. Musculoskeletal: Postsurgical changes are noted in the left hip. Degenerative changes of lumbar spine are noted. IMPRESSION: No findings to suggest active GI hemorrhage. Left-sided colostomy with peristomal herniation of small bowel loops although no incarceration is noted. Fatty liver. No acute abnormality is noted. Electronically Signed   By: Alcide Clever M.D.    On: 04/22/2020 21:14        Scheduled Meds: . insulin aspart  0-9 Units Subcutaneous Q4H  . pantoprazole (PROTONIX) IV  40 mg Intravenous Q12H   Continuous Infusions: . sodium chloride 125 mL/hr at 04/23/20 0317     LOS: 1 day        Kathlen Mody, MD [facility]   To contact the attending provider between 7A-7P or the covering provider during after hours 7P-7A, please log into the web site [email or web address] and access using universal [place] password for that web site. If you do not have the password, please call the hospital operator.  04/23/2020, 8:50 AM

## 2020-04-23 NOTE — H&P (Addendum)
History and Physical    Hannah [name] ZGY:[id number] DOB: [birth year] DOA: 04/22/2020  PCP: [name], MD   Patient coming from: Home   Chief Complaint: Blood in colostomy bag   HPI: Hannah [name] is a 80 y.o. female with medical history significant for type 2 diabetes mellitus, hypertension, chronic kidney disease stage IIIb, history of DVT and massive PE with PEA arrest in 2014 now on warfarin, partial colectomy in 2016, and diverticulosis, now presenting to the emergency department for evaluation of blood in her colostomy bag.  Patient denies any abdominal pain but had some vague abdominal discomfort yesterday followed by appearance of dark red blood in her colostomy bag.  She has continued to have bleeding into her colostomy bag but denies any nausea, vomiting, shortness of breath, or lightheadedness.  ED Course: Upon arrival to the ED, patient is found to be afebrile, saturating well on room air, and with normal heart rate and initially stable blood pressure.  She suffered a brief syncopal episode in triage.  Had no acute findings on CT abdomen/pelvis.  Chemistry panel features a creatinine 1.28, similar to priors.  CBC with hemoglobin 13.9 initially and then 10.9 four hours later.  Platelet count was normal on initial CBC and then 102,000.  INR was 3.3.  Gastroenterology was consulted by the ED physician, type and screen was performed, and the patient was given 500 cc of saline and 10 mg IV vitamin K.  Review of Systems:  All other systems reviewed and apart from HPI, are negative.  Past Medical History:  Diagnosis Date  . Acute massive pulmonary embolism ([place]) 07/13/2012   Massive PE w/ PEA arrest 07/13/12 >TNK >IVC filter >discharged on comadin    . Anticoagulated on warfarin   . Chronic diastolic CHF (congestive heart failure) ([place]) 03/15/2017  . Chronic kidney disease, stage 3a ([place]) 12/15/2018  . Diabetes mellitus, type 2 ([place]) 07/15/2012   with peripheral neuropathy  . DM2  (diabetes mellitus, type 2) ([place]) 07/23/2012  . Hyperlipemia 07/14/2012  . Hyperlipidemia 02/25/2013  . Hypertension 07/14/2012  . Hypothyroid 06/06/2013  . Large bowel stricture ([facility])    s/p colectomy in 2016  . Neuropathy 02/12/2015  . Osteoarthritis    s/p hip and knee replacements  . Pulmonary hypertension ([place]) 07/26/2016  . Syncope 10/2018  . Thyroid disease     Past Surgical History:  Procedure Laterality Date  . COLONOSCOPY N/A 03/12/2016   Procedure: COLONOSCOPY;  Surgeon: Teena Irani, MD;  Location: [facility] ENDOSCOPY;  Service: Endoscopy;  Laterality: N/A;  . COLOSTOMY N/A 06/03/2014   Procedure: COLOSTOMY;  Surgeon: Erroll Luna, MD;  Location: [place];  Service: General;  Laterality: N/A;  . FLEXIBLE SIGMOIDOSCOPY N/A 05/30/2014   Procedure: Beryle Quant;  Surgeon: Carol Ada, MD;  Location: [facility] ENDOSCOPY;  Service: Endoscopy;  Laterality: N/A;  . INSERTION OF VENA CAVA FILTER N/A 07/16/2012   Procedure: INSERTION OF VENA CAVA FILTER;  Surgeon: Elam Dutch, MD;  Location: [facility] CATH LAB;  Service: Cardiovascular;  Laterality: N/A;  . KNEE ARTHROSCOPY    . PARTIAL COLECTOMY N/A 06/03/2014   Procedure: PARTIAL COLECTOMY;  Surgeon: Erroll Luna, MD;  Location: [place];  Service: General;  Laterality: N/A;  . REDUCTION MAMMAPLASTY Bilateral   . SP ARTHRO HIP*L*      Social History:   reports that she quit smoking about 52 years ago. Her smoking use included cigarettes. She has a 10.00 pack-year smoking history. She has never used smokeless tobacco. She reports  that she does not drink alcohol and does not use drugs.  Allergies  Allergen Reactions  . Augmentin [Amoxicillin-Pot Clavulanate] Itching and Other (See Comments)    Severe vaginal itching  . Tranxene [Clorazepate] Itching  . Penicillins Itching and Rash    Has patient had a PCN reaction causing immediate rash, facial/tongue/throat swelling, SOB or lightheadedness with hypotension: Yes Has patient had a PCN reaction  causing severe rash involving mucus membranes or skin necrosis: No Has patient had a PCN reaction that required hospitalization: No Has patient had a PCN reaction occurring within the last 10 years: No If all of the above answers are "NO", then may proceed with Cephalosporin use.     Family History  Problem Relation Age of Onset  . Breast cancer Mother      Prior to Admission medications   Medication Sig Start Date End Date Taking? Authorizing Provider  acetaminophen (TYLENOL) 500 MG tablet Take 500 mg by mouth every 6 (six) hours as needed for mild pain.     [provider]  amLODipine (NORVASC) 2.5 MG tablet TAKE 1 TABLET BY MOUTH AT BEDTIME 01/31/20   Koberlein, Junell C, MD  Blood Glucose Monitoring Suppl (ONETOUCH VERIO) w/Device KIT 1 each by Does not apply route as directed. 06/07/18   Koberlein, Steele Berg, MD  Cyanocobalamin (VITAMIN B-12 PO) Take 1 tablet by mouth daily.    [provider]  diclofenac Sodium (VOLTAREN) 1 % GEL Apply 4 g topically 4 (four) times daily. 06/26/19   Lamptey, Myrene Galas, MD  EUTHYROX 25 MCG tablet TAKE 1 TABLET BY MOUTH ONCE DAILY BEFORE BREAKFAST 11/25/19   Koberlein, Steele Berg, MD  furosemide (LASIX) 20 MG tablet Take 1 tablet (20 mg total) by mouth daily as needed for fluid or edema. 06/28/18   Colin Benton R, DO  glucose blood (ONETOUCH VERIO) test strip USE AS DIRECTED ONCE  A  DAY 02/08/20   Micheline Rough C, MD  lisinopril (ZESTRIL) 40 MG tablet Take 1 tablet by mouth once daily 11/25/19   Koberlein, Steele Berg, MD  metFORMIN (GLUCOPHAGE-XR) 500 MG 24 hr tablet Take 1 tablet by mouth once daily with breakfast 04/07/20   [name], MD  metoprolol tartrate (LOPRESSOR) 25 MG tablet Take 1 tablet by mouth twice daily 02/19/20   Koberlein, Steele Berg, MD  OneTouch Delica Lancets 56O MISC USE 1  TO CHECK GLUCOSE ONCE DAILY 07/02/18   Koberlein, Steele Berg, MD  polyethylene glycol (MIRALAX / GLYCOLAX) packet Take 17 g by mouth daily. 03/19/17    Elodia Florence., MD  simvastatin (ZOCOR) 20 MG tablet Take 1 tablet (20 mg total) by mouth at bedtime. 02/26/20   [name], MD  traMADol (ULTRAM) 50 MG tablet Take 1 tablet (50 mg total) by mouth at bedtime as needed. Patient not taking: Reported on 03/10/2020 06/26/19   Chase Picket, MD  vitamin C (ASCORBIC ACID) 500 MG tablet Take 500 mg by mouth daily.     [provider]  warfarin (COUMADIN) 5 MG tablet TAKE 1/2 TO 1 (ONE-HALF TO ONE) TABLET BY MOUTH DAILY AS DIRECTED BY  COUMADIN  CLINIC 04/14/20   Fay Records, MD    Physical Exam: Vitals:   04/22/20 2232 04/22/20 2245 04/22/20 2300 04/22/20 2315  BP:  128/67 119/70 (!) 116/57  Pulse: 79 70 77 75  Resp: '19 11 15 15  ' Temp:      SpO2: 97% 100% 98% 99%  Constitutional: NAD, calm  Eyes: PERTLA, lids and conjunctivae normal ENMT: Mucous membranes are moist. Posterior pharynx clear of any exudate or lesions.   Neck: supple, no masses  Respiratory:  no wheezing, no crackles. No accessory muscle use.  Cardiovascular: S1 & S2 heard, regular rate and rhythm. Mild lower LLE edema.   Abdomen: No distension, no tenderness, soft, colostomy on left with dark red blood. Bowel sounds active.  Musculoskeletal: no clubbing / cyanosis. No joint deformity upper and lower extremities.   Skin: no significant rashes, lesions, ulcers. Warm, dry, well-perfused. Neurologic: CN 2-12 grossly intact. Sensation intact. Moving all extremities.  Psychiatric: Alert and oriented to person, place, and situation. Very pleasant and cooperative.    Labs and Imaging on Admission: I have personally reviewed following labs and imaging studies  CBC: Recent Labs  Lab 04/22/20 1723 04/22/20 2132  WBC 9.3 12.0*  NEUTROABS 4.8  --   HGB 13.9 10.9*  HCT 45.6 35.8*  MCV 91.9 92.7  PLT 248 440*   Basic Metabolic Panel: Recent Labs  Lab 04/22/20 1723  NA 136  K 4.4  CL 106  CO2 21*  GLUCOSE 149*  BUN 23  CREATININE 1.28*   CALCIUM 9.0   GFR: CrCl cannot be calculated (Unknown ideal weight.). Liver Function Tests: Recent Labs  Lab 04/22/20 1723  AST 19  ALT 13  ALKPHOS 57  BILITOT 0.6  PROT 6.4*  ALBUMIN 3.4*   No results for input(s): LIPASE, AMYLASE in the last 168 hours. No results for input(s): AMMONIA in the last 168 hours. Coagulation Profile: Recent Labs  Lab 04/22/20 1723  INR 3.3*   Cardiac Enzymes: No results for input(s): CKTOTAL, CKMB, CKMBINDEX, TROPONINI in the last 168 hours. BNP (last 3 results) No results for input(s): PROBNP in the last 8760 hours. HbA1C: No results for input(s): HGBA1C in the last 72 hours. CBG: Recent Labs  Lab 04/22/20 1738  GLUCAP 186*   Lipid Profile: No results for input(s): CHOL, HDL, LDLCALC, TRIG, CHOLHDL, LDLDIRECT in the last 72 hours. Thyroid Function Tests: No results for input(s): TSH, T4TOTAL, FREET4, T3FREE, THYROIDAB in the last 72 hours. Anemia Panel: No results for input(s): VITAMINB12, FOLATE, FERRITIN, TIBC, IRON, RETICCTPCT in the last 72 hours. Urine analysis:    Component Value Date/Time   COLORURINE YELLOW 05/21/2018 0635   APPEARANCEUR HAZY (A) 05/21/2018 0635   LABSPEC 1.020 05/21/2018 0635   PHURINE 5.0 05/21/2018 0635   GLUCOSEU NEGATIVE 05/21/2018 0635   HGBUR NEGATIVE 05/21/2018 0635   BILIRUBINUR NEGATIVE 05/21/2018 0635   KETONESUR NEGATIVE 05/21/2018 0635   PROTEINUR NEGATIVE 05/21/2018 0635   UROBILINOGEN 0.2 03/14/2017 1102   NITRITE NEGATIVE 05/21/2018 0635   LEUKOCYTESUR NEGATIVE 05/21/2018 0635   Sepsis Labs: '@LABRCNTIP' (procalcitonin:4,lacticidven:4) ) Recent Results (from the past 240 hour(s))  Resp Panel by RT-PCR (Flu A&B, Covid) Nasopharyngeal Swab     Status: None   Collection Time: 04/22/20  6:20 PM   Specimen: Nasopharyngeal Swab; Nasopharyngeal(NP) swabs in vial transport medium  Result Value Ref Range Status   SARS Coronavirus 2 by RT PCR NEGATIVE NEGATIVE Final    Comment:  (NOTE) SARS-CoV-2 target nucleic acids are NOT DETECTED.  The SARS-CoV-2 RNA is generally detectable in upper respiratory specimens during the acute phase of infection. The lowest concentration of SARS-CoV-2 viral copies this assay can detect is 138 copies/mL. A negative result does not preclude SARS-Cov-2 infection and should not be used as the sole basis for treatment or other patient management decisions. A  negative result may occur with  improper specimen collection/handling, submission of specimen other than nasopharyngeal swab, presence of viral mutation(s) within the areas targeted by this assay, and inadequate number of viral copies(<138 copies/mL). A negative result must be combined with clinical observations, patient history, and epidemiological information. The expected result is Negative.  Fact Sheet for Patients:  EntrepreneurPulse.com.au  Fact Sheet for Healthcare Providers:  IncredibleEmployment.be  This test is no t yet approved or cleared by the [place] FDA and  has been authorized for detection and/or diagnosis of SARS-CoV-2 by FDA under an Emergency Use Authorization (EUA). This EUA will remain  in effect (meaning this test can be used) for the duration of the COVID-19 declaration under Section 564(b)(1) of the Act, 21 U.S.C.section 360bbb-3(b)(1), unless the authorization is terminated  or revoked sooner.       Influenza A by PCR NEGATIVE NEGATIVE Final   Influenza B by PCR NEGATIVE NEGATIVE Final    Comment: (NOTE) The Xpert Xpress SARS-CoV-2/FLU/RSV plus assay is intended as an aid in the diagnosis of influenza from Nasopharyngeal swab specimens and should not be used as a sole basis for treatment. Nasal washings and aspirates are unacceptable for Xpert Xpress SARS-CoV-2/FLU/RSV testing.  Fact Sheet for Patients: EntrepreneurPulse.com.au  Fact Sheet for Healthcare  Providers: IncredibleEmployment.be  This test is not yet approved or cleared by the [place] FDA and has been authorized for detection and/or diagnosis of SARS-CoV-2 by FDA under an Emergency Use Authorization (EUA). This EUA will remain in effect (meaning this test can be used) for the duration of the COVID-19 declaration under Section 564(b)(1) of the Act, 21 U.S.C. section 360bbb-3(b)(1), unless the authorization is terminated or revoked.  Performed at [facility], [place] [address] [place], [place] [address]      Radiological Exams on Admission: CT ABDOMEN PELVIS W CONTRAST  Result Date: 04/22/2020 CLINICAL DATA:  Abdominal pain and GI bleeding from ostomy EXAM: CT ABDOMEN AND PELVIS WITH CONTRAST TECHNIQUE: Multidetector CT imaging of the abdomen and pelvis was performed using the standard protocol following bolus administration of intravenous contrast. CONTRAST:  41m OMNIPAQUE IOHEXOL 300 MG/ML  SOLN COMPARISON:  10/24/2017 FINDINGS: Lower chest: Lung bases are free of acute infiltrate or sizable effusion. Mild scarring is noted in the bases bilaterally. Hepatobiliary: Fatty infiltration of the liver is seen. Gallbladder is decompressed. Pancreas: Pancreas is within normal limits. Spleen: Normal in size without focal abnormality. Adrenals/Urinary Tract: Adrenal glands are unremarkable. Kidneys demonstrate a normal enhancement pattern bilaterally. Upper pole renal cyst is noted on the right. This is stable from the prior exam. Excretion of contrast is noted bilaterally. The bladder is partially distended. Stomach/Bowel: Hartmann's pouch is noted and stable. The appendix is within normal limits. Residual colon is unremarkable with a left-sided colostomy identified. Peri stomal herniation of small bowel loops is noted although no incarceration or obstructive changes seen. Stomach and small bowel are otherwise within normal limits. Vascular/Lymphatic: IVC filter is  noted in place. Atherosclerotic calcifications of the abdominal aorta are noted. No aneurysmal dilatation is seen. No lymphadenopathy is noted. Reproductive: Status post hysterectomy. No adnexal masses. Other: No abdominal wall hernia or abnormality. No abdominopelvic ascites. Musculoskeletal: Postsurgical changes are noted in the left hip. Degenerative changes of lumbar spine are noted. IMPRESSION: No findings to suggest active GI hemorrhage. Left-sided colostomy with peristomal herniation of small bowel loops although no incarceration is noted. Fatty liver. No acute abnormality is noted. Electronically Signed   By: MInez Catalina  M.D.   On: 04/22/2020 21:14    EKG: Independently reviewed. Sinus rhythm, non-specific IVCD with LAD and LVH, similar to prior.    Assessment/Plan   1. Acute GI bleeding  - Presents with one day of dark red blood in colostomy bag, had 3 g drop in Hgb over 4 hours and a syncopal episode in triage, BP and HR stable since   - INR was 3.3 and she was given vit K in ED  - No acute findings on CT abd/pelvis  - BUN is unchanged and no upper GI s/s  - She had partial colectomy in 2016, diverticulosis on colonoscopy 2018, similar presentation to this in March 2019 with negative tagged rbc scan and bleeding spontaneously  - GI consulting and much appreciated  - Trend H&H, transfuse as needed, start IV PPI q12h for now though lower GIB seems most likely    2. Syncope  - She suffered and brief LOC in triage in setting of acute GIB  - Continue IVF hydration, monitor H&H and transfuse if needed, continue cardiac monitoring    3. History of DVT and PE  - No evidence for acute VTE  - Warfarin held and vit K given in ED in setting of acute bleed, will need to reweigh risk/benefit of resuming anticoagulation once bleeding resolved    4. Thrombocytopenia  - Platelets normal initially in ED and then 102k on repeat CBC  - Most likely spurious, repeat CBC    5. CKD IIIb  - SCr is  1.28 on admission, consistent with her apparent baseline  - Renally-dose medications, monitor    6. Hypertension  - Hold scheduled antihypertensives for now in setting of acute GIB    7. Type II DM  - A1c was 6.5% in October 2021  - Check CBGs and use low-intensity SSI for now    8. Chronic diastolic CHF  - EF was preserved in 2020  - Appears hypovolemic on admission  - Hold Lasix, hydrate with IVF, monitor weight and I/Os    DVT prophylaxis: SCDs, warfarin pta  Code Status: Full  Level of Care: Level of care: Progressive Family Communication: None present  Disposition Plan:  Patient is from: home  Anticipated d/c is to: Home  Anticipated d/c date is: 04/26/20 Patient currently: pending GI consultation  Consults called: GI  Admission status: Inpatient     Vianne Bulls, MD [facility]  04/23/2020, 12:53 AM

## 2020-04-23 NOTE — Progress Notes (Signed)
  Echocardiogram 2D Echocardiogram has been performed.  Leta Jungling M 04/23/2020, 3:15 PM

## 2020-04-23 NOTE — Consult Note (Signed)
Reason for Consult: GI bleeding Referring Physician: Hospital team  Hannah [name] is an 80 y.o. female.  HPI: Patient seen and examined in both her hospital computer chart in our office computer chart reviewed and she was not having any GI issues until her maroon stool started and specifically no upper tract symptoms or abdominal pain and she is on Coumadin but no aspirin or nonsteroidals and her case was discussed with her 2 daughters and her previous colonoscopy 4 years ago through the ostomy did show residual diverticuli and she has no current complaints and says she believes the blood has slowed down  Past Medical History:  Diagnosis Date  . Acute massive pulmonary embolism (HCC) 07/13/2012   Massive PE w/ PEA arrest 07/13/12 >TNK >IVC filter >discharged on comadin    . Anticoagulated on warfarin   . Chronic diastolic CHF (congestive heart failure) (HCC) 03/15/2017  . Chronic kidney disease, stage 3a (HCC) 12/15/2018  . Diabetes mellitus, type 2 (HCC) 07/15/2012   with peripheral neuropathy  . DM2 (diabetes mellitus, type 2) (HCC) 07/23/2012  . Hyperlipemia 07/14/2012  . Hyperlipidemia 02/25/2013  . Hypertension 07/14/2012  . Hypothyroid 06/06/2013  . Large bowel stricture (HCC)    s/p colectomy in 2016  . Neuropathy 02/12/2015  . Osteoarthritis    s/p hip and knee replacements  . Pulmonary hypertension (HCC) 07/26/2016  . Syncope 10/2018  . Thyroid disease     Past Surgical History:  Procedure Laterality Date  . COLONOSCOPY N/A 03/12/2016   Procedure: COLONOSCOPY;  Surgeon: Dorena Cookey, MD;  Location[facility] ENDOSCOPY;  Service: Endoscopy;  Laterality: N/A;  . COLOSTOMY N/A 06/03/2014   Procedure: COLOSTOMY;  Surgeon: Harriette Bouillon, MD;  Location: [facility] OR;  Service: General;  Laterality: N/A;  . FLEXIBLE SIGMOIDOSCOPY N/A 05/30/2014   Procedure: Arnell Sieving;  Surgeon: Jeani Hawking, MD;  Location: [facility] ENDOSCOPY;  Service: Endoscopy;  Laterality: N/A;  . INSERTION OF VENA CAVA FILTER N/A  07/16/2012   Procedure: INSERTION OF VENA CAVA FILTER;  Surgeon: [name], MD;  Location: [facility] CATH LAB;  Service: Cardiovascular;  Laterality: N/A;  . KNEE ARTHROSCOPY    . PARTIAL COLECTOMY N/A 06/03/2014   Procedure: PARTIAL COLECTOMY;  Surgeon: Harriette Bouillon, MD;  Location: [facility] OR;  Service: General;  Laterality: N/A;  . REDUCTION MAMMAPLASTY Bilateral   . SP ARTHRO HIP*L*      Family History  Problem Relation Age of Onset  . Breast cancer Mother     Social History:  reports that she quit smoking about 52 years ago. Her smoking use included cigarettes. She has a 10.00 pack-year smoking history. She has never used smokeless tobacco. She reports that she does not drink alcohol and does not use drugs.  Allergies:  Allergies  Allergen Reactions  . Augmentin [Amoxicillin-Pot Clavulanate] Itching and Other (See Comments)    Severe vaginal itching  . Tranxene [Clorazepate] Itching  . Penicillins Itching and Rash    Has patient had a PCN reaction causing immediate rash, facial/tongue/throat swelling, SOB or lightheadedness with hypotension: Yes Has patient had a PCN reaction causing severe rash involving mucus membranes or skin necrosis: No Has patient had a PCN reaction that required hospitalization: No Has patient had a PCN reaction occurring within the last 10 years: No If all of the above answers are "NO", then may proceed with Cephalosporin use.     Medications: I have reviewed the patient's current medications.  Results for orders placed or performed during the hospital encounter of  04/22/20 (from the past 48 hour(s))  Comprehensive metabolic panel     Status: Abnormal   Collection Time: 04/22/20  5:23 PM  Result Value Ref Range   Sodium 136 135 - 145 mmol/L   Potassium 4.4 3.5 - 5.1 mmol/L   Chloride 106 98 - 111 mmol/L   CO2 21 (L) 22 - 32 mmol/L   Glucose, Bld 149 (H) 70 - 99 mg/dL    Comment: Glucose reference range applies only to samples taken after fasting for  at least 8 hours.   BUN 23 8 - 23 mg/dL   Creatinine, Ser 8.41 (H) 0.44 - 1.00 mg/dL   Calcium 9.0 8.9 - 66.0 mg/dL   Total Protein 6.4 (L) 6.5 - 8.1 g/dL   Albumin 3.4 (L) 3.5 - 5.0 g/dL   AST 19 15 - 41 U/L   ALT 13 0 - 44 U/L   Alkaline Phosphatase 57 38 - 126 U/L   Total Bilirubin 0.6 0.3 - 1.2 mg/dL   GFR, Estimated 43 (L) >60 mL/min    Comment: (NOTE) Calculated using the CKD-EPI Creatinine Equation (2021)    Anion gap 9 5 - 15    Comment: Performed at [facility], [address], [place], [place] [address]  CBC with Differential     Status: Abnormal   Collection Time: 04/22/20  5:23 PM  Result Value Ref Range   WBC 9.3 4.0 - 10.5 K/uL   RBC 4.96 3.87 - 5.11 MIL/uL   Hemoglobin 13.9 12.0 - 15.0 g/dL   HCT 01.0 93.2 - 35.5 %   MCV 91.9 80.0 - 100.0 fL   MCH 28.0 26.0 - 34.0 pg   MCHC 30.5 30.0 - 36.0 g/dL   RDW 73.2 (H) 20.2 - 54.2 %   Platelets 248 150 - 400 K/uL   nRBC 0.0 0.0 - 0.2 %   Neutrophils Relative % 51 %   Neutro Abs 4.8 1.7 - 7.7 K/uL   Lymphocytes Relative 41 %   Lymphs Abs 3.8 0.7 - 4.0 K/uL   Monocytes Relative 5 %   Monocytes Absolute 0.5 0.1 - 1.0 K/uL   Eosinophils Relative 2 %   Eosinophils Absolute 0.2 0.0 - 0.5 K/uL   Basophils Relative 1 %   Basophils Absolute 0.1 0.0 - 0.1 K/uL   Immature Granulocytes 0 %   Abs Immature Granulocytes 0.04 0.00 - 0.07 K/uL    Comment: Performed at [facility], [address] [place], [place] [address]  Protime-INR     Status: Abnormal   Collection Time: 04/22/20  5:23 PM  Result Value Ref Range   Prothrombin Time 33.1 (H) 11.4 - 15.2 seconds   INR 3.3 (H) 0.8 - 1.2    Comment: (NOTE) INR goal varies based on device and disease states. Performed at [facility], [address], [place], [place] [address]   Type and screen [facility]     Status: None   Collection Time: 04/22/20  5:25 PM  Result Value Ref Range   ABO/RH(D) O POS    Antibody Screen NEG    Sample  Expiration      04/25/2020,2359 Performed at [facility], [address] [place], [place] [address]   CBG monitoring, ED     Status: Abnormal   Collection Time: 04/22/20  5:38 PM  Result Value Ref Range   Glucose-Capillary 186 (H) 70 - 99 mg/dL    Comment: Glucose reference range  applies only to samples taken after fasting for at least 8 hours.  Troponin I (High Sensitivity)     Status: None   Collection Time: 04/22/20  5:58 PM  Result Value Ref Range   Troponin I (High Sensitivity) 5 <18 ng/L    Comment: (NOTE) Elevated high sensitivity troponin I (hsTnI) values and significant  changes across serial measurements may suggest ACS but many other  chronic and acute conditions are known to elevate hsTnI results.  Refer to the "Links" section for chest pain algorithms and additional  guidance. Performed at [facility], [address] [place], [place] [address]   Resp Panel by RT-PCR (Flu A&B, Covid) Nasopharyngeal Swab     Status: None   Collection Time: 04/22/20  6:20 PM   Specimen: Nasopharyngeal Swab; Nasopharyngeal(NP) swabs in vial transport medium  Result Value Ref Range   SARS Coronavirus 2 by RT PCR NEGATIVE NEGATIVE    Comment: (NOTE) SARS-CoV-2 target nucleic acids are NOT DETECTED.  The SARS-CoV-2 RNA is generally detectable in upper respiratory specimens during the acute phase of infection. The lowest concentration of SARS-CoV-2 viral copies this assay can detect is 138 copies/mL. A negative result does not preclude SARS-Cov-2 infection and should not be used as the sole basis for treatment or other patient management decisions. A negative result may occur with  improper specimen collection/handling, submission of specimen other than nasopharyngeal swab, presence of viral mutation(s) within the areas targeted by this assay, and inadequate number of viral copies(<138 copies/mL). A negative result must be combined with clinical observations, patient  history, and epidemiological information. The expected result is Negative.  Fact Sheet for Patients:  BloggerCourse.comhttps://www.fda.gov/media/152166/download  Fact Sheet for Healthcare Providers:  SeriousBroker.ithttps://www.fda.gov/media/152162/download  This test is no t yet approved or cleared by the [place] FDA and  has been authorized for detection and/or diagnosis of SARS-CoV-2 by FDA under an Emergency Use Authorization (EUA). This EUA will remain  in effect (meaning this test can be used) for the duration of the COVID-19 declaration under Section 564(b)(1) of the Act, 21 U.S.C.section 360bbb-3(b)(1), unless the authorization is terminated  or revoked sooner.       Influenza A by PCR NEGATIVE NEGATIVE   Influenza B by PCR NEGATIVE NEGATIVE    Comment: (NOTE) The Xpert Xpress SARS-CoV-2/FLU/RSV plus assay is intended as an aid in the diagnosis of influenza from Nasopharyngeal swab specimens and should not be used as a sole basis for treatment. Nasal washings and aspirates are unacceptable for Xpert Xpress SARS-CoV-2/FLU/RSV testing.  Fact Sheet for Patients: BloggerCourse.comhttps://www.fda.gov/media/152166/download  Fact Sheet for Healthcare Providers: SeriousBroker.ithttps://www.fda.gov/media/152162/download  This test is not yet approved or cleared by the [place] FDA and has been authorized for detection and/or diagnosis of SARS-CoV-2 by FDA under an Emergency Use Authorization (EUA). This EUA will remain in effect (meaning this test can be used) for the duration of the COVID-19 declaration under Section 564(b)(1) of the Act, 21 U.S.C. section 360bbb-3(b)(1), unless the authorization is terminated or revoked.  Performed at [facility], [address] [place], [place] [address]   POC occult blood, ED     Status: Abnormal   Collection Time: 04/22/20  6:50 PM  Result Value Ref Range   Fecal Occult Bld POSITIVE (A) NEGATIVE  Troponin I (High Sensitivity)     Status: None   Collection Time: 04/22/20  7:58  PM  Result Value Ref Range   Troponin I (High Sensitivity) 7 <18 ng/L  Comment: (NOTE) Elevated high sensitivity troponin I (hsTnI) values and significant  changes across serial measurements may suggest ACS but many other  chronic and acute conditions are known to elevate hsTnI results.  Refer to the "Links" section for chest pain algorithms and additional  guidance. Performed at [facility], [address], [place], [place] [address]   CBC     Status: Abnormal   Collection Time: 04/22/20  9:32 PM  Result Value Ref Range   WBC 12.0 (H) 4.0 - 10.5 K/uL   RBC 3.86 (L) 3.87 - 5.11 MIL/uL   Hemoglobin 10.9 (L) 12.0 - 15.0 g/dL   HCT 32.6 (L) 71.2 - 45.8 %   MCV 92.7 80.0 - 100.0 fL   MCH 28.2 26.0 - 34.0 pg   MCHC 30.4 30.0 - 36.0 g/dL   RDW 09.9 (H) 83.3 - 82.5 %   Platelets 102 (L) 150 - 400 K/uL    Comment: Immature Platelet Fraction may be clinically indicated, consider ordering this additional test [id number] REPEATED TO VERIFY PLATELET COUNT CONFIRMED BY SMEAR DELTA CHECK NOTED RESULT CALLED TO, READ BACK BY AND VERIFIED WITH:  Keitha Butte RN @1025  04/22/20 K. SANDERS    nRBC 0.0 0.0 - 0.2 %    Comment: Performed at [facility], [address], [place], [place]  CBC     Status: Abnormal   Collection Time: 04/23/20 12:00 AM  Result Value Ref Range   WBC 12.1 (H) 4.0 - 10.5 K/uL   RBC 4.01 3.87 - 5.11 MIL/uL   Hemoglobin 11.2 (L) 12.0 - 15.0 g/dL   HCT 04/25/20 90.2 - 40.9 %   MCV 92.3 80.0 - 100.0 fL   MCH 27.9 26.0 - 34.0 pg   MCHC 30.3 30.0 - 36.0 g/dL   RDW 73.5 (H) 32.9 - 92.4 %   Platelets 204 150 - 400 K/uL    Comment: REPEATED TO VERIFY DELTA CHECK NOTED    nRBC 0.0 0.0 - 0.2 %    Comment: Performed at [facility], [address], [place], [place]  Glucose, capillary     Status: Abnormal   Collection Time: 04/23/20  2:52 AM  Result Value Ref Range   Glucose-Capillary 162 (H) 70 - 99 mg/dL    Comment: Glucose reference  range applies only to samples taken after fasting for at least 8 hours.  CBC     Status: Abnormal   Collection Time: 04/23/20  4:40 AM  Result Value Ref Range   WBC 11.6 (H) 4.0 - 10.5 K/uL   RBC 3.89 3.87 - 5.11 MIL/uL   Hemoglobin 10.9 (L) 12.0 - 15.0 g/dL   HCT 04/25/20 (L) 22.2 - 97.9 %   MCV 90.7 80.0 - 100.0 fL   MCH 28.0 26.0 - 34.0 pg   MCHC 30.9 30.0 - 36.0 g/dL   RDW 89.2 (H) 11.9 - 41.7 %   Platelets 210 150 - 400 K/uL   nRBC 0.0 0.0 - 0.2 %    Comment: Performed at [facility], [address], [place], [place]  Hemoglobin A1c     Status: Abnormal   Collection Time: 04/23/20  4:40 AM  Result Value Ref Range   Hgb A1c MFr Bld 6.4 (H) 4.8 - 5.6 %    Comment: (NOTE) Pre diabetes:          5.7%-6.4%  Diabetes:              >  6.4%  Glycemic control for   <7.0% adults with diabetes    Mean Plasma Glucose 136.98 mg/dL    Comment: Performed at [facility], [address], [place], [place] [address]  Basic metabolic panel     Status: Abnormal   Collection Time: 04/23/20  4:40 AM  Result Value Ref Range   Sodium 138 135 - 145 mmol/L   Potassium 4.4 3.5 - 5.1 mmol/L   Chloride 110 98 - 111 mmol/L   CO2 22 22 - 32 mmol/L   Glucose, Bld 151 (H) 70 - 99 mg/dL    Comment: Glucose reference range applies only to samples taken after fasting for at least 8 hours.   BUN 20 8 - 23 mg/dL   Creatinine, Ser 2.51 (H) 0.44 - 1.00 mg/dL   Calcium 8.3 (L) 8.9 - 10.3 mg/dL   GFR, Estimated 48 (L) >60 mL/min    Comment: (NOTE) Calculated using the CKD-EPI Creatinine Equation (2021)    Anion gap 6 5 - 15    Comment: Performed at [facility], [address] [place], [place] [address]  Protime-INR     Status: Abnormal   Collection Time: 04/23/20  4:40 AM  Result Value Ref Range   Prothrombin Time 20.1 (H) 11.4 - 15.2 seconds   INR 1.7 (H) 0.8 - 1.2    Comment: (NOTE) INR goal varies based on device and disease states. Performed at [facility],  [address], [place], [place] [address]   Glucose, capillary     Status: Abnormal   Collection Time: 04/23/20  7:53 AM  Result Value Ref Range   Glucose-Capillary 119 (H) 70 - 99 mg/dL    Comment: Glucose reference range applies only to samples taken after fasting for at least 8 hours.    CT ABDOMEN PELVIS W CONTRAST  Result Date: 04/22/2020 CLINICAL DATA:  Abdominal pain and GI bleeding from ostomy EXAM: CT ABDOMEN AND PELVIS WITH CONTRAST TECHNIQUE: Multidetector CT imaging of the abdomen and pelvis was performed using the standard protocol following bolus administration of intravenous contrast. CONTRAST:  36mL OMNIPAQUE IOHEXOL 300 MG/ML  SOLN COMPARISON:  10/24/2017 FINDINGS: Lower chest: Lung bases are free of acute infiltrate or sizable effusion. Mild scarring is noted in the bases bilaterally. Hepatobiliary: Fatty infiltration of the liver is seen. Gallbladder is decompressed. Pancreas: Pancreas is within normal limits. Spleen: Normal in size without focal abnormality. Adrenals/Urinary Tract: Adrenal glands are unremarkable. Kidneys demonstrate a normal enhancement pattern bilaterally. Upper pole renal cyst is noted on the right. This is stable from the prior exam. Excretion of contrast is noted bilaterally. The bladder is partially distended. Stomach/Bowel: Hartmann's pouch is noted and stable. The appendix is within normal limits. Residual colon is unremarkable with a left-sided colostomy identified. Peri stomal herniation of small bowel loops is noted although no incarceration or obstructive changes seen. Stomach and small bowel are otherwise within normal limits. Vascular/Lymphatic: IVC filter is noted in place. Atherosclerotic calcifications of the abdominal aorta are noted. No aneurysmal dilatation is seen. No lymphadenopathy is noted. Reproductive: Status post hysterectomy. No adnexal masses. Other: No abdominal wall hernia or abnormality. No abdominopelvic ascites. Musculoskeletal:  Postsurgical changes are noted in the left hip. Degenerative changes of lumbar spine are noted. IMPRESSION: No findings to suggest active GI hemorrhage. Left-sided colostomy with peristomal herniation of small bowel loops although no incarceration is noted. Fatty liver. No acute abnormality is noted. Electronically Signed   By: Alcide Clever  M.D.   On: 04/22/2020 21:14  Review of systems negative except above she does say the last time she had bleeding she required a transfusion and was weaker and dizzy and that was heavier  Review of SystemsBlood pressure 103/64, pulse 74, temperature 98.3 F (36.8 C), temperature source Oral, resp. rate 16, height 5\' 7"  (1.702 m), weight 82.1 kg, SpO2 97 %. Physical Exam vital signs stable afebrile no acute distress exam pertinent for abdomen being soft nontender BUN and creatinine okay hemoglobin decreased to 10.9 INR decreased to 1.7 from 3.3 I believe she got FFP CT reviewed no significant findings Assessment/Plan: Probably diverticular bleed and patient on Coumadin Plan: We will allow clear liquids if bleeding increases consider nuclear bleeding scan and if positive consult IR for possible angiogram and coil otherwise we will follow with you and consider a repeat colonoscopy through her ostomy if symptoms continue  [facility] E 04/23/2020, 9:57 AM

## 2020-04-23 NOTE — ED Notes (Signed)
Attempted to give reports x1

## 2020-04-24 ENCOUNTER — Inpatient Hospital Stay (HOSPITAL_COMMUNITY): Payer: Medicare Other

## 2020-04-24 DIAGNOSIS — N1831 Chronic kidney disease, stage 3a: Secondary | ICD-10-CM | POA: Diagnosis not present

## 2020-04-24 DIAGNOSIS — I5032 Chronic diastolic (congestive) heart failure: Secondary | ICD-10-CM | POA: Diagnosis not present

## 2020-04-24 DIAGNOSIS — K922 Gastrointestinal hemorrhage, unspecified: Secondary | ICD-10-CM | POA: Diagnosis not present

## 2020-04-24 DIAGNOSIS — E1140 Type 2 diabetes mellitus with diabetic neuropathy, unspecified: Secondary | ICD-10-CM | POA: Diagnosis not present

## 2020-04-24 LAB — CBC WITH DIFFERENTIAL/PLATELET
Abs Immature Granulocytes: 0.06 10*3/uL (ref 0.00–0.07)
Basophils Absolute: 0 10*3/uL (ref 0.0–0.1)
Basophils Relative: 0 %
Eosinophils Absolute: 0.1 10*3/uL (ref 0.0–0.5)
Eosinophils Relative: 1 %
HCT: 28.2 % — ABNORMAL LOW (ref 36.0–46.0)
Hemoglobin: 8.8 g/dL — ABNORMAL LOW (ref 12.0–15.0)
Immature Granulocytes: 1 %
Lymphocytes Relative: 22 %
Lymphs Abs: 2.8 10*3/uL (ref 0.7–4.0)
MCH: 28.5 pg (ref 26.0–34.0)
MCHC: 31.2 g/dL (ref 30.0–36.0)
MCV: 91.3 fL (ref 80.0–100.0)
Monocytes Absolute: 0.8 10*3/uL (ref 0.1–1.0)
Monocytes Relative: 6 %
Neutro Abs: 8.7 10*3/uL — ABNORMAL HIGH (ref 1.7–7.7)
Neutrophils Relative %: 70 %
Platelets: 166 10*3/uL (ref 150–400)
RBC: 3.09 MIL/uL — ABNORMAL LOW (ref 3.87–5.11)
RDW: 16.4 % — ABNORMAL HIGH (ref 11.5–15.5)
WBC: 12.5 10*3/uL — ABNORMAL HIGH (ref 4.0–10.5)
nRBC: 0.2 % (ref 0.0–0.2)

## 2020-04-24 LAB — BASIC METABOLIC PANEL
Anion gap: 6 (ref 5–15)
BUN: 16 mg/dL (ref 8–23)
CO2: 22 mmol/L (ref 22–32)
Calcium: 8.4 mg/dL — ABNORMAL LOW (ref 8.9–10.3)
Chloride: 111 mmol/L (ref 98–111)
Creatinine, Ser: 1.4 mg/dL — ABNORMAL HIGH (ref 0.44–1.00)
GFR, Estimated: 39 mL/min — ABNORMAL LOW (ref >60)
Glucose, Bld: 115 mg/dL — ABNORMAL HIGH (ref 70–99)
Potassium: 4.2 mmol/L (ref 3.5–5.1)
Sodium: 139 mmol/L (ref 135–145)

## 2020-04-24 LAB — GLUCOSE, CAPILLARY
Glucose-Capillary: 109 mg/dL — ABNORMAL HIGH (ref 70–99)
Glucose-Capillary: 125 mg/dL — ABNORMAL HIGH (ref 70–99)
Glucose-Capillary: 126 mg/dL — ABNORMAL HIGH (ref 70–99)
Glucose-Capillary: 134 mg/dL — ABNORMAL HIGH (ref 70–99)
Glucose-Capillary: 134 mg/dL — ABNORMAL HIGH (ref 70–99)
Glucose-Capillary: 165 mg/dL — ABNORMAL HIGH (ref 70–99)
Glucose-Capillary: 90 mg/dL (ref 70–99)

## 2020-04-24 LAB — PROTIME-INR
INR: 1.3 — ABNORMAL HIGH (ref 0.8–1.2)
Prothrombin Time: 15.9 seconds — ABNORMAL HIGH (ref 11.4–15.2)

## 2020-04-24 NOTE — TOC Initial Note (Addendum)
Transition of Care [facility]) - Initial/Assessment Note    Patient Details  Name: Hannah [name] MRN: [id number] Date of Birth: Feb 03, 1940  [facility] CM/SW Contact:    Lockie Pares, RN Phone Number: 04/24/2020, 2:59 PM  Clinical Narrative:                 Patient admitted for bleeding via colostomy bag, on anticoagulation, history of PE resulting in PEA arrest 2014.  PING- [facility] however [facility] was called and they do not have her as current  Cm will follow for needs  Expected Discharge Plan: Home w Home Health Services Barriers to Discharge: Continued Medical Work up   Patient Goals and CMS Choice        Expected Discharge Plan and Services Expected Discharge Plan: Home w Home Health Services   Discharge Planning Services: CM Consult   Living arrangements for the past 2 months: Single Family Home                                      Prior Living Arrangements/Services Living arrangements for the past 2 months: Single Family Home Lives with:: Adult Children Patient language and need for interpreter reviewed:: Yes        Need for Family Participation in Patient Care: Yes (Comment) Care giver support system in place?: Yes (comment)   Criminal Activity/Legal Involvement Pertinent to Current Situation/Hospitalization: No - Comment as needed  Activities of Daily Living Home Assistive Devices/Equipment: Walker (specify type),Blood pressure cuff,CBG Meter,Eyeglasses,Reacher,Raised toilet seat with rails,Bedside commode/3-in-1,Cane (specify quad or straight),Dentures (specify type),Scales (rollater (at bedside), partial dentures upper and lower) ADL Screening (condition at time of admission) Patient's cognitive ability adequate to safely complete daily activities?: Yes Is the patient deaf or have difficulty hearing?: No Does the patient have difficulty seeing, even when wearing glasses/contacts?: No Does the  patient have difficulty concentrating, remembering, or making decisions?: No Patient able to express need for assistance with ADLs?: Yes Does the patient have difficulty dressing or bathing?: No Independently performs ADLs?: Yes (appropriate for developmental age) Does the patient have difficulty walking or climbing stairs?: No Weakness of Legs: None Weakness of Arms/Hands: None  Permission Sought/Granted                  Emotional Assessment       Orientation: : Oriented to Self,Oriented to Place Alcohol / Substance Use: Not Applicable Psych Involvement: No (comment)  Admission diagnosis:  Acute GI bleeding [K92.2] Gastrointestinal hemorrhage, unspecified gastrointestinal hemorrhage type [K92.2] Syncope, unspecified syncope type [R55] Patient Active Problem List   Diagnosis Date Noted  . Thrombocytopenia (HCC) 04/23/2020  . Acute GI bleeding 04/22/2020  . Coronary artery calcification seen on CT scan 06/24/2019  . Hypertension 04/25/2019  . Chronic kidney disease, stage 3a (HCC) 12/15/2018  . Syncope 10/30/2018  . Frequent falls 05/21/2018  . Encounter for therapeutic drug monitoring 05/07/2018  . Large bowel stricture (HCC) 12/15/2017  . Chronic diastolic CHF (congestive heart failure) (HCC) 03/15/2017  . Anticoagulated on warfarin   . Pulmonary hypertension (HCC) 07/26/2016  . Neuropathy 02/12/2015  . Osteoarthritis 08/12/2014  . History of pulmonary embolism 06/10/2013  . Hypothyroid 06/06/2013  . Diabetes mellitus with neurological manifestations, controlled (HCC) 06/06/2013  . Hyperlipidemia 02/25/2013  . DM2 (diabetes mellitus, type 2) (HCC) 07/23/2012   PCP:  Wynn Banker, MD  Pharmacy:   [facility] [address] [place]), [place] - [address] [place] [address] Phone: [phone] Fax: [phone]     Social Determinants of Health (SDOH) Interventions    Readmission Risk Interventions No  flowsheet data found.

## 2020-04-24 NOTE — Progress Notes (Signed)
PROGRESS NOTE    Hannah [name]  GUY:[id number] DOB: Mar 13, 1940 DOA: 04/22/2020 PCP: Wynn Banker, MD    No chief complaint on file.   Brief Narrative:    Hannah [name] is a 80 y.o. female with medical history significant for type 2 diabetes mellitus, hypertension, chronic kidney disease stage IIIb, history of DVT and massive PE with PEA arrest in 2014 now on warfarin, partial colectomy in 2016, and diverticulosis, now presenting to the emergency department for evaluation of blood in her colostomy bag.  Patient denies any abdominal pain but had some vague abdominal discomfort yesterday followed by appearance of dark red blood in her colostomy bag.  She suffered a brief syncopal episode in triage.  Had no acute findings on CT abdomen/pelvis. Gastroenterology was consulted by the ED physician, type and screen was performed, and the patient was given 500 cc of saline and 10 mg IV vitamin K.   Assessment & Plan:   Principal Problem:   Acute GI bleeding Active Problems:   Diabetes mellitus with neurological manifestations, controlled (HCC)   History of pulmonary embolism   Chronic diastolic CHF (congestive heart failure) (HCC)   Syncope   Chronic kidney disease, stage 3a (HCC)   Hypertension   Thrombocytopenia (HCC)    Acute GI bleed probably diverticular bleed.  Baseline hemoglobin around 13, dropped to 10.9 to 9.8 to 8.8.  Probably diverticular bleed.  GI consulted,suggested probably a diverticular bleed and being on coumadin.  Recommend to continue with clear liquids. Continue with PPI IV Ordered nuclear bleeding scan, if positive, IR angiogram vs repeat colonoscopy via her ostomy if the Nuclear scan is negative.  Transfuse to keep hemoglobin greater than 7.     Diabetes mellitus type 2 , non insulin dependent, with stage 3 a ckd.  CBG (last 3)  Recent Labs    04/24/20 0403 04/24/20 0751 04/24/20 1153  GLUCAP 134* 90 125*   Resume SSI. CBG'S are well controlled.      H/o DVT and PE:  Was on coumadin at home, which is being held for GI bleed. INR is sub therapeutic .    Essential hypertension; Well controlled.  BP parameters are optimal.    transient LOC ./syncope on admission: Get orthostatic vital signs.  Probably from GI blood loss.  Echocardiogram reviewed.   Stage 3 a CKD:  Creatinine at baseline around 1.4.   Mild leukocytosis:  - reactive.    Chronic Systolic and diastolic CHF;  She appears compensated and euvolemic.  Echocardiogram showed LVEF of 45 to 50% and grade 1 diastolic dysfunction. No regional wall abnormalities.    Thrombocytopenia Resolved.      DVT prophylaxis:SCD'S  Code Status: FULL CODE Family Communication:  Daughter at bedside Disposition:   Status is: Inpatient  Remains inpatient appropriate because:Ongoing diagnostic testing needed not appropriate for outpatient work up and IV treatments appropriate due to intensity of illness or inability to take PO   Dispo: The patient is from: Home              Anticipated d/c is to: pending              Patient currently is not medically stable to d/c.   Difficult to place patient No       Consultants:   gi   Procedures: none    Antimicrobials: none   Subjective: No nausea or vomiting , continues to have bleeding in the colostomy. No dizziness. No chest pain or sob.  Objective: Vitals:   04/24/20 0418 04/24/20 0422 04/24/20 0748 04/24/20 1151  BP: (!) 110/55  (!) 114/50 (!) 107/59  Pulse: 92  84 97  Resp: 18  17 20   Temp: 98.3 F (36.8 C)  98.7 F (37.1 C) 98.3 F (36.8 C)  TempSrc: Oral  Oral Oral  SpO2: 97%  99% 97%  Weight:  83.6 kg    Height:        Intake/Output Summary (Last 24 hours) at 04/24/2020 1526 Last data filed at 04/24/2020 1038 Gross per 24 hour  Intake --  Output 1290 ml  Net -1290 ml   Filed Weights   04/23/20 0220 04/23/20 0445 04/24/20 0422  Weight: 80.7 kg 82.1 kg 83.6 kg     Examination:  General exam: Alert and comfortable.  Respiratory system: clear to auscultation, no wheezing heard.  Cardiovascular system: S1S2 heard. RRR no JVD.  Gastrointestinal system: Abdomen is SOFT,NT , bs+ colostomy in place with some blood in it.  Central nervous system: alert and oriented, non focal. Extremities: no cyanosis.  Skin: No rashes seen.  Psychiatry:  Mood  Is appropriate.     Data Reviewed: I have personally reviewed following labs and imaging studies  CBC: Recent Labs  Lab 04/22/20 1723 04/22/20 2132 04/23/20 0000 04/23/20 0440 04/23/20 1138 04/23/20 1903 04/23/20 2112 04/24/20 0440  WBC 9.3   < > 12.1* 11.6* 10.7* 15.9*  --  12.5*  NEUTROABS 4.8  --   --   --   --   --   --  8.7*  HGB 13.9   < > 11.2* 10.9* 9.8* 9.6* 9.0* 8.8*  HCT 45.6   < > 37.0 35.3* 31.5* 31.1* 29.3* 28.2*  MCV 91.9   < > 92.3 90.7 91.6 91.7  --  91.3  PLT 248   < > 204 210 185 184  --  166   < > = values in this interval not displayed.    Basic Metabolic Panel: Recent Labs  Lab 04/22/20 1723 04/23/20 0440 04/24/20 0440  NA 136 138 139  K 4.4 4.4 4.2  CL 106 110 111  CO2 21* 22 22  GLUCOSE 149* 151* 115*  BUN 23 20 16   CREATININE 1.28* 1.16* 1.40*  CALCIUM 9.0 8.3* 8.4*    GFR: Estimated Creatinine Clearance: 36.8 mL/min (A) (by C-G formula based on SCr of 1.4 mg/dL (H)).  Liver Function Tests: Recent Labs  Lab 04/22/20 1723  AST 19  ALT 13  ALKPHOS 57  BILITOT 0.6  PROT 6.4*  ALBUMIN 3.4*    CBG: Recent Labs  Lab 04/23/20 2113 04/24/20 0012 04/24/20 0403 04/24/20 0751 04/24/20 1153  GLUCAP 109* 126* 134* 90 125*     Recent Results (from the past 240 hour(s))  Resp Panel by RT-PCR (Flu A&B, Covid) Nasopharyngeal Swab     Status: None   Collection Time: 04/22/20  6:20 PM   Specimen: Nasopharyngeal Swab; Nasopharyngeal(NP) swabs in vial transport medium  Result Value Ref Range Status   SARS Coronavirus 2 by RT PCR NEGATIVE NEGATIVE Final     Comment: (NOTE) SARS-CoV-2 target nucleic acids are NOT DETECTED.  The SARS-CoV-2 RNA is generally detectable in upper respiratory specimens during the acute phase of infection. The lowest concentration of SARS-CoV-2 viral copies this assay can detect is 138 copies/mL. A negative result does not preclude SARS-Cov-2 infection and should not be used as the sole basis for treatment or other patient management decisions. A negative result may occur with  improper specimen collection/handling, submission of specimen other than nasopharyngeal swab, presence of viral mutation(s) within the areas targeted by this assay, and inadequate number of viral copies(<138 copies/mL). A negative result must be combined with clinical observations, patient history, and epidemiological information. The expected result is Negative.  Fact Sheet for Patients:  BloggerCourse.com  Fact Sheet for Healthcare Providers:  SeriousBroker.it  This test is no t yet approved or cleared by the [place] FDA and  has been authorized for detection and/or diagnosis of SARS-CoV-2 by FDA under an Emergency Use Authorization (EUA). This EUA will remain  in effect (meaning this test can be used) for the duration of the COVID-19 declaration under Section 564(b)(1) of the Act, 21 U.S.C.section 360bbb-3(b)(1), unless the authorization is terminated  or revoked sooner.       Influenza A by PCR NEGATIVE NEGATIVE Final   Influenza B by PCR NEGATIVE NEGATIVE Final    Comment: (NOTE) The Xpert Xpress SARS-CoV-2/FLU/RSV plus assay is intended as an aid in the diagnosis of influenza from Nasopharyngeal swab specimens and should not be used as a sole basis for treatment. Nasal washings and aspirates are unacceptable for Xpert Xpress SARS-CoV-2/FLU/RSV testing.  Fact Sheet for Patients: BloggerCourse.com  Fact Sheet for Healthcare  Providers: SeriousBroker.it  This test is not yet approved or cleared by the [place] FDA and has been authorized for detection and/or diagnosis of SARS-CoV-2 by FDA under an Emergency Use Authorization (EUA). This EUA will remain in effect (meaning this test can be used) for the duration of the COVID-19 declaration under Section 564(b)(1) of the Act, 21 U.S.C. section 360bbb-3(b)(1), unless the authorization is terminated or revoked.  Performed at [facility], [address] [place], [place] [address]          Radiology Studies: CT ABDOMEN PELVIS W CONTRAST  Result Date: 04/22/2020 CLINICAL DATA:  Abdominal pain and GI bleeding from ostomy EXAM: CT ABDOMEN AND PELVIS WITH CONTRAST TECHNIQUE: Multidetector CT imaging of the abdomen and pelvis was performed using the standard protocol following bolus administration of intravenous contrast. CONTRAST:  88mL OMNIPAQUE IOHEXOL 300 MG/ML  SOLN COMPARISON:  10/24/2017 FINDINGS: Lower chest: Lung bases are free of acute infiltrate or sizable effusion. Mild scarring is noted in the bases bilaterally. Hepatobiliary: Fatty infiltration of the liver is seen. Gallbladder is decompressed. Pancreas: Pancreas is within normal limits. Spleen: Normal in size without focal abnormality. Adrenals/Urinary Tract: Adrenal glands are unremarkable. Kidneys demonstrate a normal enhancement pattern bilaterally. Upper pole renal cyst is noted on the right. This is stable from the prior exam. Excretion of contrast is noted bilaterally. The bladder is partially distended. Stomach/Bowel: Hartmann's pouch is noted and stable. The appendix is within normal limits. Residual colon is unremarkable with a left-sided colostomy identified. Peri stomal herniation of small bowel loops is noted although no incarceration or obstructive changes seen. Stomach and small bowel are otherwise within normal limits. Vascular/Lymphatic: IVC filter is noted  in place. Atherosclerotic calcifications of the abdominal aorta are noted. No aneurysmal dilatation is seen. No lymphadenopathy is noted. Reproductive: Status post hysterectomy. No adnexal masses. Other: No abdominal wall hernia or abnormality. No abdominopelvic ascites. Musculoskeletal: Postsurgical changes are noted in the left hip. Degenerative changes of lumbar spine are noted. IMPRESSION: No findings to suggest active GI hemorrhage. Left-sided colostomy with peristomal herniation of small bowel loops although no incarceration is noted. Fatty liver. No acute abnormality is noted. Electronically Signed   By: Alcide Clever M.D.   On:  04/22/2020 21:14   ECHOCARDIOGRAM COMPLETE  Result Date: 04/23/2020    ECHOCARDIOGRAM REPORT   Patient Name:   Hannah [name] Date of Exam: 04/23/2020 Medical Rec #:  [id number]    Height:       67.0 in Accession #:    [id number]   Weight:       181.0 lb Date of Birth:  24-Jan-1940    BSA:          1.938 m Patient Age:    79 years     BP:           106/64 mmHg Patient Gender: F            HR:           83 bpm. Exam Location:  Inpatient Procedure: 2D Echo, Color Doppler and Cardiac Doppler Indications:    Syncope R55  History:        Patient has prior history of Echocardiogram examinations, most                 recent 10/31/2018. CHF, Pulmonary HTN; Risk Factors:Diabetes,                 Hypertension and Dyslipidemia. Thyroid diseae. Chronic kidney                 disease.  Sonographer:    Leta Jungling RDCS Referring Phys: Nilda Calamity Hagar Sadiq IMPRESSIONS  1. Inferior basal hypokinesis abnormal septal motion . Left ventricular ejection fraction, by estimation, is 45 to 50%. The left ventricle has mildly decreased function. The left ventricle has no regional wall motion abnormalities. There is mild left ventricular hypertrophy. Left ventricular diastolic parameters are consistent with Grade I diastolic dysfunction (impaired relaxation).  2. Right ventricular systolic function is normal.  The right ventricular size is normal. There is normal pulmonary artery systolic pressure.  3. The mitral valve is degenerative. No evidence of mitral valve regurgitation. No evidence of mitral stenosis. Moderate mitral annular calcification.  4. The aortic valve is tricuspid. There is mild calcification of the aortic valve. Aortic valve regurgitation is not visualized. Mild aortic valve sclerosis is present, with no evidence of aortic valve stenosis.  5. The inferior vena cava is dilated in size with <50% respiratory variability, suggesting right atrial pressure of 15 mmHg. FINDINGS  Left Ventricle: Inferior basal hypokinesis abnormal septal motion. Left ventricular ejection fraction, by estimation, is 45 to 50%. The left ventricle has mildly decreased function. The left ventricle has no regional wall motion abnormalities. The left ventricular internal cavity size was normal in size. There is mild left ventricular hypertrophy. Left ventricular diastolic parameters are consistent with Grade I diastolic dysfunction (impaired relaxation). Right Ventricle: The right ventricular size is normal. No increase in right ventricular wall thickness. Right ventricular systolic function is normal. There is normal pulmonary artery systolic pressure. The tricuspid regurgitant velocity is 2.54 m/s, and  with an assumed right atrial pressure of 3 mmHg, the estimated right ventricular systolic pressure is 28.8 mmHg. Left Atrium: Left atrial size was normal in size. Right Atrium: Right atrial size was normal in size. Pericardium: There is no evidence of pericardial effusion. Mitral Valve: The mitral valve is degenerative in appearance. There is moderate thickening of the mitral valve leaflet(s). There is moderate calcification of the mitral valve leaflet(s). Moderate mitral annular calcification. No evidence of mitral valve regurgitation. No evidence of mitral valve stenosis. Tricuspid Valve: The tricuspid valve is normal in structure.  Tricuspid valve regurgitation is  mild . No evidence of tricuspid stenosis. Aortic Valve: The aortic valve is tricuspid. There is mild calcification of the aortic valve. Aortic valve regurgitation is not visualized. Mild aortic valve sclerosis is present, with no evidence of aortic valve stenosis. Pulmonic Valve: The pulmonic valve was normal in structure. Pulmonic valve regurgitation is not visualized. No evidence of pulmonic stenosis. Aorta: The aortic root is normal in size and structure. Venous: The inferior vena cava is dilated in size with less than 50% respiratory variability, suggesting right atrial pressure of 15 mmHg. IAS/Shunts: No atrial level shunt detected by color flow Doppler.  LEFT VENTRICLE PLAX 2D LVIDd:         3.70 cm  Diastology LVIDs:         2.50 cm  LV e' medial:    7.07 cm/s LV PW:         1.10 cm  LV E/e' medial:  7.4 LV IVS:        1.20 cm  LV e' lateral:   8.70 cm/s LVOT diam:     1.90 cm  LV E/e' lateral: 6.0 LV SV:         39 LV SV Index:   20 LVOT Area:     2.84 cm  RIGHT VENTRICLE RV S prime:     15.20 cm/s LEFT ATRIUM             Index       RIGHT ATRIUM           Index LA diam:        3.10 cm 1.60 cm/m  RA Area:     10.20 cm LA Vol (A2C):   21.7 ml 11.20 ml/m RA Volume:   18.00 ml  9.29 ml/m LA Vol (A4C):   13.5 ml 6.97 ml/m LA Biplane Vol: 17.7 ml 9.13 ml/m  AORTIC VALVE LVOT Vmax:   99.00 cm/s LVOT Vmean:  68.300 cm/s LVOT VTI:    0.139 m  AORTA Ao Root diam: 2.70 cm Ao Asc diam:  3.50 cm MITRAL VALVE               TRICUSPID VALVE MV Area (PHT): 1.62 cm    TR Peak grad:   25.8 mmHg MV Decel Time: 468 msec    TR Vmax:        254.00 cm/s MV E velocity: 52.40 cm/s MV A velocity: 85.90 cm/s  SHUNTS MV E/A ratio:  0.61        Systemic VTI:  0.14 m                            Systemic Diam: 1.90 cm Charlton Haws MD Electronically signed by Charlton Haws MD Signature Date/Time: 04/23/2020/3:20:10 PM    Final         Scheduled Meds: . insulin aspart  0-9 Units Subcutaneous Q4H   . pantoprazole (PROTONIX) IV  40 mg Intravenous Q12H   Continuous Infusions:    LOS: 2 days        Kathlen Mody, MD [facility]   To contact the attending provider between 7A-7P or the covering provider during after hours 7P-7A, please log into the web site [email or web address] and access using universal [place] password for that web site. If you do not have the password, please call the hospital operator.  04/24/2020, 3:26 PM

## 2020-04-24 NOTE — Progress Notes (Signed)
[facility] Progress Note  Hannah [name] 80 y.o. 10/19/1940  CC:  GI bleeding  Subjective: Patient denies further episodes of blood in ostomy.  Denies abdominal pain, nausea, vomiting.  ROS : Review of Systems  Cardiovascular: Negative for chest pain and palpitations.  Gastrointestinal: Negative for abdominal pain, blood in stool, constipation, diarrhea, heartburn, melena, nausea and vomiting.    Objective: Vital signs in last 24 hours: Vitals:   04/24/20 0418 04/24/20 0748  BP: (!) 110/55 (!) 114/50  Pulse: 92 84  Resp: 18 17  Temp: 98.3 F (36.8 C) 98.7 F (37.1 C)  SpO2: 97% 99%    Physical Exam:  General:  Lethargic, oriented, cooperative, no distress  Head:  Normocephalic, without obvious abnormality, atraumatic  Eyes:  Anicteric sclera, EOMs intact   Lungs:   Clear to auscultation bilaterally, respirations unlabored  Heart:  Regular rate and rhythm, S1, S2 normal  Abdomen:   Soft, non-tender, non-distended; ostomy with dark brown output   Extremities: Extremities normal, atraumatic, no  edema  Pulses: 2+ and symmetric    Lab Results: Recent Labs    04/23/20 0440 04/24/20 0440  NA 138 139  K 4.4 4.2  CL 110 111  CO2 22 22  GLUCOSE 151* 115*  BUN 20 16  CREATININE 1.16* 1.40*  CALCIUM 8.3* 8.4*   Recent Labs    04/22/20 1723  AST 19  ALT 13  ALKPHOS 57  BILITOT 0.6  PROT 6.4*  ALBUMIN 3.4*   Recent Labs    04/22/20 1723 04/22/20 2132 04/23/20 1903 04/23/20 2112 04/24/20 0440  WBC 9.3   < > 15.9*  --  12.5*  NEUTROABS 4.8  --   --   --  8.7*  HGB 13.9   < > 9.6* 9.0* 8.8*  HCT 45.6   < > 31.1* 29.3* 28.2*  MCV 91.9   < > 91.7  --  91.3  PLT 248   < > 184  --  166   < > = values in this interval not displayed.   Recent Labs    04/23/20 0440 04/24/20 0440  LABPROT 20.1* 15.9*  INR 1.7* 1.3*    Assessment: Bloody ostomy output: No further signs of bleeding, though Hgb continues to down-trend and is now 8.8.   -INR  1.3 -BUN now normal (16) with Cr 1.40  History of DVT/PE/ PEA arrest, on Coumadin   Plan: Agree with nuclear bleeding scan.  If positive, consult IR for possible embolization.  If negative, consider outpatient scope.  Continue Protonix BID.  Continue supportive care. Continue to monitor H&H with transfusion as needed to maintain Hgb >7.  [facility] will follow.   Edrick Kins PA-C 04/24/2020, 9:46 AM  Contact #  [phone]

## 2020-04-24 NOTE — Evaluation (Signed)
Physical Therapy Evaluation Patient Details Name: Hannah [name] MRN: [id number] DOB: [birth year] Today's Date: 04/24/2020   History of Present Illness  Pt presented 4/20 with acute GI bleed. Pt with 3g drop in Hgb and syncopal episode in ED. PMH - colostomy, DVT, PE, htn, dm, CKD, chf, lt THR  Clinical Impression  Pt presents to PT with decr in functional mobility due to generalized weakness. Expect pt to make steady progress toward return to baseline as medical issues improve. May need HHPT depending on progress.     Follow Up Recommendations Home health PT;Other (comment) (vs no follow up)    Equipment Recommendations  None recommended by PT    Recommendations for Other Services       Precautions / Restrictions Precautions Precautions: Fall Restrictions Weight Bearing Restrictions: No      Mobility  Bed Mobility Overal bed mobility: Modified Independent             General bed mobility comments: Incr time    Transfers Overall transfer level: Needs assistance Equipment used: 4-wheeled walker Transfers: Sit to/from Stand Sit to Stand: Min assist;From elevated surface         General transfer comment: Assist to bring hips up  Ambulation/Gait Ambulation/Gait assistance: Min guard Gait Distance (Feet): 150 Feet Assistive device: 4-wheeled walker Gait Pattern/deviations: Step-through pattern;Decreased stride length Gait velocity: decr Gait velocity interpretation: <1.31 ft/sec, indicative of household ambulator General Gait Details: Assist for Wellsite geologist    Modified Rankin (Stroke Patients Only)       Balance Overall balance assessment: Needs assistance Sitting-balance support: No upper extremity supported;Feet supported Sitting balance-Leahy Scale: Fair     Standing balance support: Bilateral upper extremity supported Standing balance-Leahy Scale: Poor Standing balance comment: rollator and min guard for  static standing                             Pertinent Vitals/Pain Pain Assessment: No/denies pain    Home Living Family/patient expects to be discharged to:: Private residence Living Arrangements: Alone Available Help at Discharge: Family;Available PRN/intermittently Type of Home: House Home Access: Stairs to enter Entrance Stairs-Rails: Right;Left;Can reach both Entrance Stairs-Number of Steps: 5 Home Layout: One level Home Equipment: Walker - 4 wheels;Cane - single point;Bedside commode;Shower seat Additional Comments: life alert    Prior Function Level of Independence: Needs assistance   Gait / Transfers Assistance Needed: Mofidified independent with rollator           Hand Dominance   Dominant Hand: Right    Extremity/Trunk Assessment   Upper Extremity Assessment Upper Extremity Assessment: Defer to OT evaluation    Lower Extremity Assessment Lower Extremity Assessment: Generalized weakness       Communication   Communication: No difficulties  Cognition Arousal/Alertness: Awake/alert Behavior During Therapy: WFL for tasks assessed/performed Overall Cognitive Status: Within Functional Limits for tasks assessed                                        General Comments General comments (skin integrity, edema, etc.): HR to 130's with amb    Exercises     Assessment/Plan    PT Assessment Patient needs continued PT services  PT Problem List Decreased strength;Decreased activity tolerance;Decreased balance;Decreased mobility       PT  Treatment Interventions DME instruction;Gait training;Therapeutic activities;Therapeutic exercise;Balance training;Functional mobility training;Stair training;Patient/family education    PT Goals (Current goals can be found in the Care Plan section)  Acute Rehab PT Goals Patient Stated Goal: return home PT Goal Formulation: With patient Time For Goal Achievement: 05/08/20 Potential to Achieve  Goals: Good    Frequency Min 3X/week   Barriers to discharge Decreased caregiver support lives alone    Co-evaluation               AM-PAC PT "6 Clicks" Mobility  Outcome Measure Help needed turning from your back to your side while in a flat bed without using bedrails?: None Help needed moving from lying on your back to sitting on the side of a flat bed without using bedrails?: None Help needed moving to and from a bed to a chair (including a wheelchair)?: A Little Help needed standing up from a chair using your arms (e.g., wheelchair or bedside chair)?: A Little Help needed to walk in hospital room?: A Little Help needed climbing 3-5 steps with a railing? : A Little 6 Click Score: 20    End of Session   Activity Tolerance: Patient limited by fatigue Patient left: in chair;with call bell/phone within reach;with chair alarm set Nurse Communication: Mobility status PT Visit Diagnosis: Other abnormalities of gait and mobility (R26.89);Muscle weakness (generalized) (M62.81);History of falling (Z91.81)    Time: 3785-8850 PT Time Calculation (min) (ACUTE ONLY): 22 min   Charges:   PT Evaluation $PT Eval Moderate Complexity: 1 Mod          [facility] PT Acute Rehabilitation Services Pager [phone] Office [phone]   Angelina Ok [facility] 04/24/2020, 2:39 PM

## 2020-04-24 NOTE — Progress Notes (Signed)
Referred RN to phlebotomy for blood draw for labs.

## 2020-04-25 DIAGNOSIS — K922 Gastrointestinal hemorrhage, unspecified: Secondary | ICD-10-CM | POA: Diagnosis not present

## 2020-04-25 DIAGNOSIS — N1831 Chronic kidney disease, stage 3a: Secondary | ICD-10-CM | POA: Diagnosis not present

## 2020-04-25 DIAGNOSIS — I5032 Chronic diastolic (congestive) heart failure: Secondary | ICD-10-CM | POA: Diagnosis not present

## 2020-04-25 DIAGNOSIS — E1140 Type 2 diabetes mellitus with diabetic neuropathy, unspecified: Secondary | ICD-10-CM | POA: Diagnosis not present

## 2020-04-25 LAB — GLUCOSE, CAPILLARY
Glucose-Capillary: 96 mg/dL (ref 70–99)
Glucose-Capillary: 98 mg/dL (ref 70–99)
Glucose-Capillary: 137 mg/dL — ABNORMAL HIGH (ref 70–99)
Glucose-Capillary: 156 mg/dL — ABNORMAL HIGH (ref 70–99)
Glucose-Capillary: 199 mg/dL — ABNORMAL HIGH (ref 70–99)

## 2020-04-25 LAB — CBC WITH DIFFERENTIAL/PLATELET
Abs Immature Granulocytes: 0.06 10*3/uL (ref 0.00–0.07)
Basophils Absolute: 0 10*3/uL (ref 0.0–0.1)
Basophils Relative: 0 %
Eosinophils Absolute: 0.2 10*3/uL (ref 0.0–0.5)
Eosinophils Relative: 2 %
HCT: 26.4 % — ABNORMAL LOW (ref 36.0–46.0)
Hemoglobin: 8.3 g/dL — ABNORMAL LOW (ref 12.0–15.0)
Immature Granulocytes: 1 %
Lymphocytes Relative: 36 %
Lymphs Abs: 4 10*3/uL (ref 0.7–4.0)
MCH: 28.6 pg (ref 26.0–34.0)
MCHC: 31.4 g/dL (ref 30.0–36.0)
MCV: 91 fL (ref 80.0–100.0)
Monocytes Absolute: 0.7 10*3/uL (ref 0.1–1.0)
Monocytes Relative: 6 %
Neutro Abs: 6.2 10*3/uL (ref 1.7–7.7)
Neutrophils Relative %: 55 %
Platelets: 176 10*3/uL (ref 150–400)
RBC: 2.9 MIL/uL — ABNORMAL LOW (ref 3.87–5.11)
RDW: 16.3 % — ABNORMAL HIGH (ref 11.5–15.5)
WBC: 11.2 10*3/uL — ABNORMAL HIGH (ref 4.0–10.5)
nRBC: 0 % (ref 0.0–0.2)

## 2020-04-25 LAB — PROTIME-INR
INR: 1.2 (ref 0.8–1.2)
Prothrombin Time: 15.3 seconds — ABNORMAL HIGH (ref 11.4–15.2)

## 2020-04-25 LAB — BASIC METABOLIC PANEL
Anion gap: 6 (ref 5–15)
BUN: 16 mg/dL (ref 8–23)
CO2: 21 mmol/L — ABNORMAL LOW (ref 22–32)
Calcium: 8.2 mg/dL — ABNORMAL LOW (ref 8.9–10.3)
Chloride: 111 mmol/L (ref 98–111)
Creatinine, Ser: 1.32 mg/dL — ABNORMAL HIGH (ref 0.44–1.00)
GFR, Estimated: 41 mL/min — ABNORMAL LOW (ref >60)
Glucose, Bld: 84 mg/dL (ref 70–99)
Potassium: 3.8 mmol/L (ref 3.5–5.1)
Sodium: 138 mmol/L (ref 135–145)

## 2020-04-25 MED ORDER — PANTOPRAZOLE SODIUM 40 MG PO TBEC
40.0000 mg | DELAYED_RELEASE_TABLET | Freq: Every day | ORAL | Status: DC
  Administered 2020-04-26: 40 mg via ORAL
  Filled 2020-04-25: qty 1

## 2020-04-25 MED ORDER — LEVOTHYROXINE SODIUM 25 MCG PO TABS
25.0000 ug | ORAL_TABLET | Freq: Every day | ORAL | Status: DC
  Administered 2020-04-26: 25 ug via ORAL
  Filled 2020-04-25: qty 1

## 2020-04-25 MED ORDER — WARFARIN SODIUM 7.5 MG PO TABS
7.5000 mg | ORAL_TABLET | Freq: Once | ORAL | Status: AC
  Administered 2020-04-25: 7.5 mg via ORAL
  Filled 2020-04-25: qty 1

## 2020-04-25 MED ORDER — WARFARIN - PHARMACIST DOSING INPATIENT: Freq: Every day | Status: DC

## 2020-04-25 NOTE — Progress Notes (Signed)
Pt has small amount of brown liquid stool in colostomy bag. Not enough to empty but it is now present. No s/s of bleeding.

## 2020-04-25 NOTE — Progress Notes (Signed)
[facility] Gastroenterology Progress Note  Jazira Maloney 80 y.o. [birth year]  CC: GI bleed   Subjective: Patient seen and examined at bedside.  No further bleeding episodes.  No stool in the ostomy bag today.  Denies abdominal pain, nausea and vomiting  ROS : Afebrile, negative for chest pain   Objective: Vital signs in last 24 hours: Vitals:   04/25/20 0400 04/25/20 0750  BP: (!) 117/52 (!) 121/56  Pulse: 90 86  Resp: 17 16  Temp: 98.9 F (37.2 C) 98.5 F (36.9 C)  SpO2: 96%     Physical Exam:  General:  Alert, cooperative, no distress, appears stated age  Head:  Normocephalic, without obvious abnormality, atraumatic  Eyes:  , EOM's intact,   Lungs:   Clear to auscultation bilaterally, respirations unlabored, anterior exam only  Heart:  Regular rate and rhythm, S1, S2 normal  Abdomen:   Soft, non-tender, nondistended, bowel sounds present.  Ostomy bag empty  Psych  mood and affect normal       Lab Results: Recent Labs    04/24/20 0440 04/25/20 0042  NA 139 138  K 4.2 3.8  CL 111 111  CO2 22 21*  GLUCOSE 115* 84  BUN 16 16  CREATININE 1.40* 1.32*  CALCIUM 8.4* 8.2*   Recent Labs    04/22/20 1723  AST 19  ALT 13  ALKPHOS 57  BILITOT 0.6  PROT 6.4*  ALBUMIN 3.4*   Recent Labs    04/24/20 0440 04/25/20 0859  WBC 12.5* 11.2*  NEUTROABS 8.7* 6.2  HGB 8.8* 8.3*  HCT 28.2* 26.4*  MCV 91.3 91.0  PLT 166 176   Recent Labs    04/24/20 0440 04/25/20 0042  LABPROT 15.9* 15.3*  INR 1.3* 1.2      Assessment/Plan: -GI bleed with bloody ostomy output.  No further bleeding episodes in last 24 hours.  Hemoglobin relatively stable.  CT abdomen pelvis with IV contrast on April 22, 2020 showed no evidence of active hemorrhage and no acute changes -History of DVT/PE -Coumadin on hold  Recommendations ----------------------- -No further bleeding episodes in last 24 hours.  I do not think bleeding scan is needed at this point. -DC bleeding scan -Okay to  resume Coumadin from GI standpoint -Change Protonix to p.o. 40 mg once a day -Advance diet to soft -GI will sign off.  Call us back if needed.  Okay to discharge from GI standpoint. -Discussed with hospitalist   Kathi Der MD, FACP 04/25/2020, 10:17 AM  Contact #  [phone]

## 2020-04-25 NOTE — Progress Notes (Signed)
PROGRESS NOTE    Jenene Kauffmann  YQI:[id number] DOB: 1940/09/19 DOA: 04/22/2020 PCP: Wynn Banker, MD    No chief complaint on file.   Brief Narrative:    Hannah [name] is a 80 y.o. female with medical history significant for type 2 diabetes mellitus, hypertension, chronic kidney disease stage IIIb, history of DVT and massive PE with PEA arrest in 2014 now on warfarin, partial colectomy in 2016, and diverticulosis, now presenting to the emergency department for evaluation of blood in her colostomy bag.  Patient denies any abdominal pain but had some vague abdominal discomfort yesterday followed by appearance of dark red blood in her colostomy bag.  She suffered a brief syncopal episode in triage.  Had no acute findings on CT abdomen/pelvis. Gastroenterology was consulted by the ED physician, type and screen was performed, and the patient was given 500 cc of saline and 10 mg IV vitamin K. Pt seen and examined at bedside. She reports no bleeding in the colostomy since yesterday.     Assessment & Plan:   Principal Problem:   Acute GI bleeding Active Problems:   Diabetes mellitus with neurological manifestations, controlled (HCC)   History of pulmonary embolism   Chronic diastolic CHF (congestive heart failure) (HCC)   Syncope   Chronic kidney disease, stage 3a (HCC)   Hypertension   Thrombocytopenia (HCC)    Acute GI bleed probably diverticular bleed.  Baseline hemoglobin around 13, dropped to 10.9 to 9.8 to 8.8 to 8.3..  Probably diverticular bleed. Self limiting.  GI consulted,suggested probably a diverticular bleed and being on coumadin.  She was started on clears , advanced to soft diet.  GI recommended discontinuing the NM blood loss. Transfuse to keep hemoglobin greater than 7.  Pt would like to get BM prior to discharge.     Diabetes mellitus type 2 , non insulin dependent, with stage 3 a ckd.  CBG (last 3)  Recent Labs    04/25/20 0417 04/25/20 0751  04/25/20 1137  GLUCAP 96 98 137*   Resume SSI. CBG'S are well controlled.     H/o DVT and PE:  Was on coumadin at home, which was  held for GI bleed. INR is sub therapeutic . Restarted the coumadin.     Essential hypertension; Well controlled.  BP parameters are optimal.    transient LOC ./syncope on admission: Probably from GI blood loss.  Echocardiogram reviewed. No regional wall abnormalities.  Stage 3 a CKD:  Creatinine at baseline around 1.4.  Chronic Systolic and diastolic CHF;  She appears compensated and euvolemic.  Echocardiogram showed LVEF of 45 to 50% and grade 1 diastolic dysfunction. No regional wall abnormalities.    Thrombocytopenia Resolved.   Mild leukocytosis; - probably reactive. No signs of infection.    DVT prophylaxis:SCD'S  Code Status: FULL CODE Family Communication:  None at bedside.  Disposition:   Status is: Inpatient  Remains inpatient appropriate because:Ongoing diagnostic testing needed not appropriate for outpatient work up and IV treatments appropriate due to intensity of illness or inability to take PO   Dispo: The patient is from: Home              Anticipated d/c is to: pending              Patient currently is not medically stable to d/c.   Difficult to place patient No       Consultants:   gi   Procedures: none    Antimicrobials: none   Subjective:  No bleeding in the colostomy bag.  No chest pain or sob.   Objective: Vitals:   04/24/20 2302 04/25/20 0400 04/25/20 0750 04/25/20 1138  BP: (!) 101/49 (!) 117/52 (!) 121/56 (!) 148/60  Pulse: 84 90 86 86  Resp: 16 17 16 18   Temp: 98.7 F (37.1 C) 98.9 F (37.2 C) 98.5 F (36.9 C) 98.2 F (36.8 C)  TempSrc: Oral Axillary Oral Oral  SpO2: 99% 96%  98%  Weight:  83.9 kg    Height:        Intake/Output Summary (Last 24 hours) at 04/25/2020 1515 Last data filed at 04/25/2020 1300 Gross per 24 hour  Intake 400 ml  Output 300 ml  Net 100 ml   Filed  Weights   04/23/20 0445 04/24/20 0422 04/25/20 0400  Weight: 82.1 kg 83.6 kg 83.9 kg    Examination:  General exam: Alert and oriented, non focal.  Respiratory system: Clear to auscultation, no wheezing heard.  Cardiovascular system: S1S2 heard. RRR, no JVD, no pedal edema.  Gastrointestinal system: Abdomen is soft, non tender , colostomy bag is empty. Bowel sounds wnl.  Central nervous system: Alert and oriented, non focal Extremities: no pedal edema.  Skin: No rashes seen.  Psychiatry:  Mood is appropriate. .     Data Reviewed: I have personally reviewed following labs and imaging studies  CBC: Recent Labs  Lab 04/22/20 1723 04/22/20 2132 04/23/20 0440 04/23/20 1138 04/23/20 1903 04/23/20 2112 04/24/20 0440 04/25/20 0859  WBC 9.3   < > 11.6* 10.7* 15.9*  --  12.5* 11.2*  NEUTROABS 4.8  --   --   --   --   --  8.7* 6.2  HGB 13.9   < > 10.9* 9.8* 9.6* 9.0* 8.8* 8.3*  HCT 45.6   < > 35.3* 31.5* 31.1* 29.3* 28.2* 26.4*  MCV 91.9   < > 90.7 91.6 91.7  --  91.3 91.0  PLT 248   < > 210 185 184  --  166 176   < > = values in this interval not displayed.    Basic Metabolic Panel: Recent Labs  Lab 04/22/20 1723 04/23/20 0440 04/24/20 0440 04/25/20 0042  NA 136 138 139 138  K 4.4 4.4 4.2 3.8  CL 106 110 111 111  CO2 21* 22 22 21*  GLUCOSE 149* 151* 115* 84  BUN 23 20 16 16   CREATININE 1.28* 1.16* 1.40* 1.32*  CALCIUM 9.0 8.3* 8.4* 8.2*    GFR: Estimated Creatinine Clearance: 39.1 mL/min (A) (by C-G formula based on SCr of 1.32 mg/dL (H)).  Liver Function Tests: Recent Labs  Lab 04/22/20 1723  AST 19  ALT 13  ALKPHOS 57  BILITOT 0.6  PROT 6.4*  ALBUMIN 3.4*    CBG: Recent Labs  Lab 04/24/20 2043 04/24/20 2303 04/25/20 0417 04/25/20 0751 04/25/20 1137  GLUCAP 165* 109* 96 98 137*     Recent Results (from the past 240 hour(s))  Resp Panel by RT-PCR (Flu A&B, Covid) Nasopharyngeal Swab     Status: None   Collection Time: 04/22/20  6:20 PM    Specimen: Nasopharyngeal Swab; Nasopharyngeal(NP) swabs in vial transport medium  Result Value Ref Range Status   SARS Coronavirus 2 by RT PCR NEGATIVE NEGATIVE Final    Comment: (NOTE) SARS-CoV-2 target nucleic acids are NOT DETECTED.  The SARS-CoV-2 RNA is generally detectable in upper respiratory specimens during the acute phase of infection. The lowest concentration of SARS-CoV-2 viral copies this assay can detect  is 138 copies/mL. A negative result does not preclude SARS-Cov-2 infection and should not be used as the sole basis for treatment or other patient management decisions. A negative result may occur with  improper specimen collection/handling, submission of specimen other than nasopharyngeal swab, presence of viral mutation(s) within the areas targeted by this assay, and inadequate number of viral copies(<138 copies/mL). A negative result must be combined with clinical observations, patient history, and epidemiological information. The expected result is Negative.  Fact Sheet for Patients:  BloggerCourse.com  Fact Sheet for Healthcare Providers:  SeriousBroker.it  This test is no t yet approved or cleared by the [place] FDA and  has been authorized for detection and/or diagnosis of SARS-CoV-2 by FDA under an Emergency Use Authorization (EUA). This EUA will remain  in effect (meaning this test can be used) for the duration of the COVID-19 declaration under Section 564(b)(1) of the Act, 21 U.S.C.section 360bbb-3(b)(1), unless the authorization is terminated  or revoked sooner.       Influenza A by PCR NEGATIVE NEGATIVE Final   Influenza B by PCR NEGATIVE NEGATIVE Final    Comment: (NOTE) The Xpert Xpress SARS-CoV-2/FLU/RSV plus assay is intended as an aid in the diagnosis of influenza from Nasopharyngeal swab specimens and should not be used as a sole basis for treatment. Nasal washings and aspirates are  unacceptable for Xpert Xpress SARS-CoV-2/FLU/RSV testing.  Fact Sheet for Patients: BloggerCourse.com  Fact Sheet for Healthcare Providers: SeriousBroker.it  This test is not yet approved or cleared by the [place] FDA and has been authorized for detection and/or diagnosis of SARS-CoV-2 by FDA under an Emergency Use Authorization (EUA). This EUA will remain in effect (meaning this test can be used) for the duration of the COVID-19 declaration under Section 564(b)(1) of the Act, 21 U.S.C. section 360bbb-3(b)(1), unless the authorization is terminated or revoked.  Performed at [facility], [address] [place], [place] [address]          Radiology Studies: No results found.      Scheduled Meds: . insulin aspart  0-9 Units Subcutaneous Q4H  . [START ON 04/26/2020] levothyroxine  25 mcg Oral Q0600  . [START ON 04/26/2020] pantoprazole  40 mg Oral Daily  . warfarin  7.5 mg Oral ONCE-1600  . Warfarin - Pharmacist Dosing Inpatient   Does not apply q1600   Continuous Infusions:    LOS: 3 days        Kathlen Mody, MD [facility]   To contact the attending provider between 7A-7P or the covering provider during after hours 7P-7A, please log into the web site [email or web address] and access using universal  password for that web site. If you do not have the password, please call the hospital operator.  04/25/2020, 3:15 PM

## 2020-04-25 NOTE — Progress Notes (Signed)
ANTICOAGULATION CONSULT NOTE - Initial Consult  Pharmacy Consult for warfarin  Indication: hx of VTE  Allergies  Allergen Reactions  . Augmentin [Amoxicillin-Pot Clavulanate] Itching and Other (See Comments)    Severe vaginal itching  . Tranxene [Clorazepate] Itching  . Penicillins Itching and Rash    Has patient had a PCN reaction causing immediate rash, facial/tongue/throat swelling, SOB or lightheadedness with hypotension: Yes Has patient had a PCN reaction causing severe rash involving mucus membranes or skin necrosis: No Has patient had a PCN reaction that required hospitalization: No Has patient had a PCN reaction occurring within the last 10 years: No If all of the above answers are "NO", then may proceed with Cephalosporin use.     Patient Measurements: Height: 5\' 7"  (170.2 cm) Weight: 83.9 kg (184 lb 15.5 oz) IBW/kg (Calculated) : 61.6   Vital Signs: Temp: 98.2 F (36.8 C) (04/23 1138) Temp Source: Oral (04/23 1138) BP: 148/60 (04/23 1138) Pulse Rate: 86 (04/23 1138)  Labs: Recent Labs    04/22/20 1758 04/22/20 1958 04/22/20 2132 04/23/20 0440 04/23/20 1138 04/23/20 1903 04/23/20 2112 04/24/20 0440 04/25/20 0042 04/25/20 0859  HGB  --   --    < > 10.9*   < > 9.6* 9.0* 8.8*  --  8.3*  HCT  --   --    < > 35.3*   < > 31.1* 29.3* 28.2*  --  26.4*  PLT  --   --    < > 210   < > 184  --  166  --  176  LABPROT  --   --   --  20.1*  --   --   --  15.9* 15.3*  --   INR  --   --   --  1.7*  --   --   --  1.3* 1.2  --   CREATININE  --   --   --  1.16*  --   --   --  1.40* 1.32*  --   TROPONINIHS 5 7  --   --   --   --   --   --   --   --    < > = values in this interval not displayed.    Estimated Creatinine Clearance: 39.1 mL/min (A) (by C-G formula based on SCr of 1.32 mg/dL (H)).    Assessment: 56 yof with hx of DVT and PE/PEA in 2014 on warfarin prior to admission. Patient presented to ED with blood in colostomy bag. Patient received 10 MG IV Vitamin K.  Last dose of warfarin reported by patient was on 4/20.   PTA Dosing: Take 5 mg tablet by mouth on Mondays and Fridays, then take 2.5 mg tablet by mouth all other days per patient  No current signs or symptoms of bleed, Hgb 8.3, and INR is currently 1.2.   Goal of Therapy:  INR 2-3 Monitor platelets by anticoagulation protocol: Yes   Plan:  Warfarin 7.5 MG today  Daily INR and CBC  Monitor for s/sx of bleed  05-23-1989, PharmD, MBA Pharmacy Resident [phone] 04/25/2020 1:12 PM

## 2020-04-25 NOTE — Progress Notes (Signed)
Physical Therapy Treatment Patient Details Name: Hannah [name] MRN: [id number] DOB: 07/02/1940 Today's Date: 04/25/2020    History of Present Illness Pt presented 4/20 with acute GI bleed. Pt with 3g drop in Hgb and syncopal episode in ED. PMH - colostomy, DVT, PE, htn, dm, CKD, chf, lt THR    PT Comments    Pt making steady progress and incr amb distance. Doesn't feel back to baseline so will recommend HHPT at DC.    Follow Up Recommendations  Home health PT     Equipment Recommendations  None recommended by PT    Recommendations for Other Services       Precautions / Restrictions Precautions Precautions: Fall Restrictions Weight Bearing Restrictions: No    Mobility  Bed Mobility Overal bed mobility: Modified Independent             General bed mobility comments: Incr time    Transfers Overall transfer level: Needs assistance Equipment used: 4-wheeled walker Transfers: Sit to/from Stand Sit to Stand: Supervision;From elevated surface         General transfer comment: Brought bed up 2". Assist for safety. Incr time to rise  Ambulation/Gait Ambulation/Gait assistance: Supervision Gait Distance (Feet): 175 Feet Assistive device: 4-wheeled walker Gait Pattern/deviations: Step-through pattern;Decreased stride length Gait velocity: decr Gait velocity interpretation: <1.31 ft/sec, indicative of household ambulator General Gait Details: Assist for Futures trader    Modified Rankin (Stroke Patients Only)       Balance Overall balance assessment: Needs assistance Sitting-balance support: No upper extremity supported;Feet supported Sitting balance-Leahy Scale: Fair     Standing balance support: Bilateral upper extremity supported Standing balance-Leahy Scale: Poor Standing balance comment: rollator and supervision for static standing                            Cognition Arousal/Alertness:  Awake/alert Behavior During Therapy: WFL for tasks assessed/performed Overall Cognitive Status: Within Functional Limits for tasks assessed                                        Exercises      General Comments General comments (skin integrity, edema, etc.): HR to 140 with activity      Pertinent Vitals/Pain Pain Assessment: No/denies pain    Home Living                      Prior Function            PT Goals (current goals can now be found in the care plan section) Acute Rehab PT Goals Patient Stated Goal: return home Progress towards PT goals: Progressing toward goals    Frequency    Min 3X/week      PT Plan Current plan remains appropriate    Co-evaluation              AM-PAC PT "6 Clicks" Mobility   Outcome Measure  Help needed turning from your back to your side while in a flat bed without using bedrails?: None Help needed moving from lying on your back to sitting on the side of a flat bed without using bedrails?: None Help needed moving to and from a bed to a chair (including a wheelchair)?: A Little Help needed standing up from a  chair using your arms (e.g., wheelchair or bedside chair)?: A Little Help needed to walk in hospital room?: A Little Help needed climbing 3-5 steps with a railing? : A Little 6 Click Score: 20    End of Session   Activity Tolerance: Patient tolerated treatment well Patient left: in chair;with call bell/phone within reach;with chair alarm set Nurse Communication: Mobility status PT Visit Diagnosis: Other abnormalities of gait and mobility (R26.89);Muscle weakness (generalized) (M62.81);History of falling (Z91.81)     Time: 9295-7473 PT Time Calculation (min) (ACUTE ONLY): 17 min  Charges:  $Gait Training: 8-22 mins                     [facility] PT Acute Rehabilitation Services Pager [phone] Office [phone]    Angelina Ok [facility] 04/25/2020, 3:40 PM

## 2020-04-26 LAB — GLUCOSE, CAPILLARY
Glucose-Capillary: 105 mg/dL — ABNORMAL HIGH (ref 70–99)
Glucose-Capillary: 106 mg/dL — ABNORMAL HIGH (ref 70–99)
Glucose-Capillary: 96 mg/dL (ref 70–99)

## 2020-04-26 LAB — RETICULOCYTES
Immature Retic Fract: 32.6 % — ABNORMAL HIGH (ref 2.3–15.9)
RBC.: 2.65 MIL/uL — ABNORMAL LOW (ref 3.87–5.11)
Retic Count, Absolute: 79.8 10*3/uL (ref 19.0–186.0)
Retic Ct Pct: 3 % (ref 0.4–3.1)

## 2020-04-26 LAB — CBC
HCT: 24.4 % — ABNORMAL LOW (ref 36.0–46.0)
Hemoglobin: 7.5 g/dL — ABNORMAL LOW (ref 12.0–15.0)
MCH: 28.6 pg (ref 26.0–34.0)
MCHC: 30.7 g/dL (ref 30.0–36.0)
MCV: 93.1 fL (ref 80.0–100.0)
Platelets: 148 10*3/uL — ABNORMAL LOW (ref 150–400)
RBC: 2.62 MIL/uL — ABNORMAL LOW (ref 3.87–5.11)
RDW: 16.3 % — ABNORMAL HIGH (ref 11.5–15.5)
WBC: 9.3 10*3/uL (ref 4.0–10.5)
nRBC: 0.2 % (ref 0.0–0.2)

## 2020-04-26 LAB — HEMOGLOBIN AND HEMATOCRIT, BLOOD
HCT: 24.6 % — ABNORMAL LOW (ref 36.0–46.0)
Hemoglobin: 7.7 g/dL — ABNORMAL LOW (ref 12.0–15.0)

## 2020-04-26 LAB — FERRITIN: Ferritin: 59 ng/mL (ref 11–307)

## 2020-04-26 LAB — IRON AND TIBC
Iron: 16 ug/dL — ABNORMAL LOW (ref 28–170)
Saturation Ratios: 7 % — ABNORMAL LOW (ref 10.4–31.8)
TIBC: 242 ug/dL — ABNORMAL LOW (ref 250–450)
UIBC: 226 ug/dL

## 2020-04-26 LAB — PROTIME-INR
INR: 1.3 — ABNORMAL HIGH (ref 0.8–1.2)
Prothrombin Time: 16 seconds — ABNORMAL HIGH (ref 11.4–15.2)

## 2020-04-26 LAB — FOLATE: Folate: 4.5 ng/mL — ABNORMAL LOW (ref >5.9)

## 2020-04-26 LAB — VITAMIN B12: Vitamin B-12: 312 pg/mL (ref 180–914)

## 2020-04-26 MED ORDER — WARFARIN SODIUM 7.5 MG PO TABS: 7.5000 mg | ORAL_TABLET | Freq: Once | ORAL | Status: DC

## 2020-04-26 MED ORDER — PANTOPRAZOLE SODIUM 40 MG PO TBEC: 40.0000 mg | DELAYED_RELEASE_TABLET | Freq: Every day | ORAL | 1 refills | Status: DC

## 2020-04-26 NOTE — Plan of Care (Signed)
  Problem: Health Behavior/Discharge Planning: Goal: Ability to manage health-related needs will improve Outcome: Adequate for Discharge   Problem: Clinical Measurements: Goal: Ability to maintain clinical measurements within normal limits will improve Outcome: Adequate for Discharge Goal: Will remain free from infection Outcome: Adequate for Discharge Goal: Diagnostic test results will improve Outcome: Adequate for Discharge Goal: Respiratory complications will improve Outcome: Adequate for Discharge   Problem: Activity: Goal: Risk for activity intolerance will decrease Outcome: Adequate for Discharge   Problem: Nutrition: Goal: Adequate nutrition will be maintained Outcome: Adequate for Discharge   Problem: Safety: Goal: Ability to remain free from injury will improve Outcome: Adequate for Discharge   Problem: Skin Integrity: Goal: Risk for impaired skin integrity will decrease Outcome: Adequate for Discharge   Problem: Education: Goal: Ability to identify signs and symptoms of gastrointestinal bleeding will improve Outcome: Adequate for Discharge   Problem: Bowel/Gastric: Goal: Will show no signs and symptoms of gastrointestinal bleeding Outcome: Adequate for Discharge   Problem: Fluid Volume: Goal: Will show no signs and symptoms of excessive bleeding Outcome: Adequate for Discharge   Problem: Clinical Measurements: Goal: Complications related to the disease process, condition or treatment will be avoided or minimized Outcome: Adequate for Discharge

## 2020-04-26 NOTE — Progress Notes (Signed)
ANTICOAGULATION CONSULT NOTE - Initial Consult  Pharmacy Consult for warfarin  Indication: hx of VTE  Allergies  Allergen Reactions  . Augmentin [Amoxicillin-Pot Clavulanate] Itching and Other (See Comments)    Severe vaginal itching  . Tranxene [Clorazepate] Itching  . Penicillins Itching and Rash    Has patient had a PCN reaction causing immediate rash, facial/tongue/throat swelling, SOB or lightheadedness with hypotension: Yes Has patient had a PCN reaction causing severe rash involving mucus membranes or skin necrosis: No Has patient had a PCN reaction that required hospitalization: No Has patient had a PCN reaction occurring within the last 10 years: No If all of the above answers are "NO", then may proceed with Cephalosporin use.     Patient Measurements: Height: 5\' 7"  (170.2 cm) Weight: 84 kg (185 lb 3 oz) IBW/kg (Calculated) : 61.6   Vital Signs: Temp: 98.2 F (36.8 C) (04/24 0745) Temp Source: Oral (04/24 0745) BP: 125/57 (04/24 0745) Pulse Rate: 87 (04/24 0745)  Labs: Recent Labs    04/24/20 0440 04/25/20 0042 04/25/20 0859 04/26/20 0101 04/26/20 0616  HGB 8.8*  --  8.3* 7.5*  --   HCT 28.2*  --  26.4* 24.4*  --   PLT 166  --  176 148*  --   LABPROT 15.9* 15.3*  --   --  16.0*  INR 1.3* 1.2  --   --  1.3*  CREATININE 1.40* 1.32*  --   --   --     Estimated Creatinine Clearance: 39.1 mL/min (A) (by C-G formula based on SCr of 1.32 mg/dL (H)).    Assessment: 59 yof with hx of DVT and PE/PEA in 2014 on warfarin prior to admission. Patient presented to ED with blood in colostomy bag. Patient received 10 MG IV Vitamin K. Last dose of warfarin reported by patient was on 4/20.   PTA Dosing: Take 5 mg tablet by mouth on Mondays and Fridays, then take 2.5 mg tablet by mouth all other days per patient  INR this morning is 1.3 which is subtherapeutic. No bleeding noted per nursing. Hgb is down slightly to 7.5 and Plt 148. Will monitor CBC and for signs of bleed  closely.   Goal of Therapy:  INR 2-3 Monitor platelets by anticoagulation protocol: Yes   Plan:  Warfarin 7.5 MG today  Daily INR and CBC  Monitor for s/sx of bleed  05-23-1989, PharmD, [facility] Pharmacy Resident [phone] 04/26/2020 8:37 AM

## 2020-04-26 NOTE — TOC Initial Note (Signed)
Transition of Care [facility]) - Initial/Assessment Note    Patient Details  Name: Hannah [name] MRN: [id number] Date of Birth: [birth year]  Transition [facility]) CM/SW Contact:    Lawerance Sabal, RN Phone Number: 04/26/2020, 9:43 AM  Clinical Narrative:   Spoke to patient regarding DC plan. She would like HH services. She was active w [facility], spoke to liaison, she is not active at this time but he is able to accept referral to start [facility] after DC. No other CM needs identified at this time.                Expected Discharge Plan: Home w Home Health Services Barriers to Discharge: Continued Medical Work up   Patient Goals and CMS Choice Patient states their goals for this hospitalization and ongoing recovery are:: to go home CMS Medicare.gov Compare Post Acute Care list provided to:: Patient Choice offered to / list presented to : Patient  Expected Discharge Plan and Services Expected Discharge Plan: Home w Home Health Services   Discharge Planning Services: CM Consult Post Acute Care Choice: Home Health Living arrangements for the past 2 months: Single Family Home                           HH Arranged: PT,OT HH Agency: [facility] Date HH Agency Contacted: 04/26/20 Time HH Agency Contacted: [phone] Representative spoke with at [facility] Agency: Barbara Cower  Prior Living Arrangements/Services Living arrangements for the past 2 months: Single Family Home Lives with:: Adult Children Patient language and need for interpreter reviewed:: Yes        Need for Family Participation in Patient Care: Yes (Comment) Care giver support system in place?: Yes (comment)   Criminal Activity/Legal Involvement Pertinent to Current Situation/Hospitalization: No - Comment as needed  Activities of Daily Living Home Assistive Devices/Equipment: Walker (specify type),Blood pressure cuff,CBG Meter,Eyeglasses,Reacher,Raised toilet seat with rails,Bedside commode/3-in-1,Cane (specify quad or  straight),Dentures (specify type),Scales (rollater (at bedside), partial dentures upper and lower) ADL Screening (condition at time of admission) Patient's cognitive ability adequate to safely complete daily activities?: Yes Is the patient deaf or have difficulty hearing?: No Does the patient have difficulty seeing, even when wearing glasses/contacts?: No Does the patient have difficulty concentrating, remembering, or making decisions?: No Patient able to express need for assistance with ADLs?: Yes Does the patient have difficulty dressing or bathing?: No Independently performs ADLs?: Yes (appropriate for developmental age) Does the patient have difficulty walking or climbing stairs?: No Weakness of Legs: None Weakness of Arms/Hands: None  Permission Sought/Granted                  Emotional Assessment       Orientation: : Oriented to Self,Oriented to Place Alcohol / Substance Use: Not Applicable Psych Involvement: No (comment)  Admission diagnosis:  Acute GI bleeding [K92.2] Gastrointestinal hemorrhage, unspecified gastrointestinal hemorrhage type [K92.2] Syncope, unspecified syncope type [R55] Patient Active Problem List   Diagnosis Date Noted  . Thrombocytopenia (HCC) 04/23/2020  . Acute GI bleeding 04/22/2020  . Coronary artery calcification seen on CT scan 06/24/2019  . Hypertension 04/25/2019  . Chronic kidney disease, stage 3a (HCC) 12/15/2018  . Syncope 10/30/2018  . Frequent falls 05/21/2018  . Encounter for therapeutic drug monitoring 05/07/2018  . Large bowel stricture (HCC) 12/15/2017  . Chronic diastolic CHF (congestive heart failure) (HCC) 03/15/2017  . Anticoagulated on warfarin   . Pulmonary hypertension (HCC) 07/26/2016  . Neuropathy 02/12/2015  .  Osteoarthritis 08/12/2014  . History of pulmonary embolism 06/10/2013  . Hypothyroid 06/06/2013  . Diabetes mellitus with neurological manifestations, controlled (HCC) 06/06/2013  . Hyperlipidemia  02/25/2013  . DM2 (diabetes mellitus, type 2) (HCC) 07/23/2012   PCP:  Wynn Banker, MD Pharmacy:   [facility] [address] ([place]), [place] - [address] [place] ([place]) [place] [address] Phone: [phone] Fax: [phone]     Social Determinants of Health (SDOH) Interventions    Readmission Risk Interventions No flowsheet data found.

## 2020-04-26 NOTE — Discharge Summary (Signed)
Physician Discharge Summary  Hannah [name] CBJ:[id number] DOB: Oct 06, 1940 DOA: 04/22/2020  PCP: [name], MD  Admit date: 04/22/2020 Discharge date: 04/26/2020  Admitted From: Home.  Disposition:  Home.   Recommendations for Outpatient Follow-up:  1. Follow up with PCP in 1-2 weeks 2. Please obtain BMP/CBC in one week 3. Please follow up with gastroenterology as needed if recurrent bleeding occurs.     Discharge Condition:Guarded. CODE STATUS: Full code.  Diet recommendation: Heart Healthy   Brief/Interim Summary:  Hannah [name] a 80 y.o.femalewith medical history significant fortype 2 diabetes mellitus, hypertension, chronic kidney disease stage IIIb, history of DVT and massive PE with PEA arrest in 2014 now on warfarin, partial colectomy in 2016, and diverticulosis, now presenting to the emergency department for evaluation of blood in her colostomy bag. Patient denies any abdominal pain but had some vague abdominal discomfort yesterday followed by appearance of dark red blood in her colostomy bag. She suffered a brief syncopal episode in triage. Had no acute findings on CT abdomen/pelvis. Gastroenterology was consulted by the ED physician, type and screen was performed, and the patient was given 500 cc of saline and 10 mg IV vitamin K.  The bleeding has stopped. GI consulted, initially recommended NM blood loss scan, but since her bleeding has stopped, he cancelled the bleeding scan.    Discharge Diagnoses: Principal Problem:   Acute GI bleeding Active Problems:   Diabetes mellitus with neurological manifestations, controlled ([place])   History of pulmonary embolism   Chronic diastolic CHF (congestive heart failure) (HCC)   Syncope   Chronic kidney disease, stage 3a (HCC)   Hypertension   Thrombocytopenia (HCC)    Acute GI bleed probably diverticular bleed.  Baseline hemoglobin around 13, dropped to 10.9 to 9.8 to 8.8 to 8.3..  Probably diverticular bleed.  Self limiting.  GI consulted,suggested probably a diverticular bleed and being on coumadin.  She was started on clears , advanced to soft diet, she is able to tolerate without any bleeding.  GI discontinued  the NM blood loss scan. Transfuse to keep hemoglobin greater than 7.      Diabetes mellitus type 2 , non insulin dependent, with stage 3 a ckd.  CBG'S are well controlled.     H/o DVT and PE:  Was on coumadin at home, which was  held for GI bleed. INR is sub therapeutic . Restarted the coumadin as per GI recommendations.     Essential hypertension; Well controlled.  BP parameters are optimal.    transient LOC ./syncope on admission: Probably from GI blood loss. No arrhythmias on telemetry.  Echocardiogram reviewed. No regional wall abnormalities.  Stage 3 a CKD:  Creatinine at baseline around 1.4.  Chronic Systolic and diastolic CHF;  She appears compensated and euvolemic.  Echocardiogram showed LVEF of 45 to 50% and grade 1 diastolic dysfunction. No regional wall abnormalities.    Thrombocytopenia Resolved.   Mild leukocytosis; - probably reactive. No signs of infection.      Discharge Instructions  Discharge Instructions    Diet - low sodium heart healthy   Complete by: As directed    Discharge instructions   Complete by: As directed    Please follow up with PCP in one week.  Please check hemoglobin in less than a week.  If you see any bleeding in the colostomy bag please come to ED for further evaluation.  Please follow up with Gastroenterology in 1 to 2 weeks.     Allergies as of 04/26/2020  Reactions   Augmentin [amoxicillin-pot Clavulanate] Itching, Other (See Comments)   Severe vaginal itching   Tranxene [clorazepate] Itching   Penicillins Itching, Rash   Has patient had a PCN reaction causing immediate rash, facial/tongue/throat swelling, SOB or lightheadedness with hypotension: Yes Has patient had a PCN reaction  causing severe rash involving mucus membranes or skin necrosis: No Has patient had a PCN reaction that required hospitalization: No Has patient had a PCN reaction occurring within the last 10 years: No If all of the above answers are "NO", then may proceed with Cephalosporin use.      Medication List    STOP taking these medications   amLODipine 2.5 MG tablet Commonly known as: NORVASC   lisinopril 40 MG tablet Commonly known as: ZESTRIL     TAKE these medications   acetaminophen 500 MG tablet Commonly known as: TYLENOL Take 500 mg by mouth every 6 (six) hours as needed for mild pain.   diclofenac Sodium 1 % Gel Commonly known as: Voltaren Apply 4 g topically 4 (four) times daily. What changed:   when to take this  reasons to take this   Euthyrox 25 MCG tablet Generic drug: levothyroxine TAKE 1 TABLET BY MOUTH ONCE DAILY BEFORE BREAKFAST What changed: how much to take   furosemide 20 MG tablet Commonly known as: LASIX Take 1 tablet (20 mg total) by mouth daily as needed for fluid or edema.   metFORMIN 500 MG 24 hr tablet Commonly known as: GLUCOPHAGE-XR Take 1 tablet by mouth once daily with breakfast   metoprolol tartrate 25 MG tablet Commonly known as: LOPRESSOR Take 1 tablet by mouth twice daily   OneTouch Delica Lancets 62E Misc USE 1  TO CHECK GLUCOSE ONCE DAILY   OneTouch Verio test strip Generic drug: glucose blood USE AS DIRECTED ONCE  A  DAY   OneTouch Verio w/Device Kit 1 each by Does not apply route as directed.   pantoprazole 40 MG tablet Commonly known as: PROTONIX Take 1 tablet (40 mg total) by mouth daily.   simvastatin 20 MG tablet Commonly known as: ZOCOR Take 1 tablet (20 mg total) by mouth at bedtime.   VITAMIN B-12 PO Take 1 tablet by mouth daily.   vitamin C 500 MG tablet Commonly known as: ASCORBIC ACID Take 500 mg by mouth daily.   warfarin 5 MG tablet Commonly known as: COUMADIN Take as directed. If you are unsure how  to take this medication, talk to your nurse or doctor. Original instructions: TAKE 1/2 TO 1 (ONE-HALF TO ONE) TABLET BY MOUTH DAILY AS DIRECTED BY  COUMADIN  CLINIC What changed:   how much to take  how to take this  when to take this  additional instructions       Follow-up [place] Follow up.   Why: for home health services, they will call  you in 1-2 days to set up a time to come to the house Contact information: [address] [place] [address] [phone]              Allergies  Allergen Reactions  . Augmentin [Amoxicillin-Pot Clavulanate] Itching and Other (See Comments)    Severe vaginal itching  . Tranxene [Clorazepate] Itching  . Penicillins Itching and Rash    Has patient had a PCN reaction causing immediate rash, facial/tongue/throat swelling, SOB or lightheadedness with hypotension: Yes Has patient had a PCN reaction causing severe rash involving mucus membranes or  skin necrosis: No Has patient had a PCN reaction that required hospitalization: No Has patient had a PCN reaction occurring within the last 10 years: No If all of the above answers are "NO", then may proceed with Cephalosporin use.     Consultations:  Gastroenterology.    Procedures/Studies: CT ABDOMEN PELVIS W CONTRAST  Result Date: 04/22/2020 CLINICAL DATA:  Abdominal pain and GI bleeding from ostomy EXAM: CT ABDOMEN AND PELVIS WITH CONTRAST TECHNIQUE: Multidetector CT imaging of the abdomen and pelvis was performed using the standard protocol following bolus administration of intravenous contrast. CONTRAST:  87m OMNIPAQUE IOHEXOL 300 MG/ML  SOLN COMPARISON:  10/24/2017 FINDINGS: Lower chest: Lung bases are free of acute infiltrate or sizable effusion. Mild scarring is noted in the bases bilaterally. Hepatobiliary: Fatty infiltration of the liver is seen. Gallbladder is decompressed. Pancreas: Pancreas is within normal limits. Spleen:  Normal in size without focal abnormality. Adrenals/Urinary Tract: Adrenal glands are unremarkable. Kidneys demonstrate a normal enhancement pattern bilaterally. Upper pole renal cyst is noted on the right. This is stable from the prior exam. Excretion of contrast is noted bilaterally. The bladder is partially distended. Stomach/Bowel: Hartmann's pouch is noted and stable. The appendix is within normal limits. Residual colon is unremarkable with a left-sided colostomy identified. Peri stomal herniation of small bowel loops is noted although no incarceration or obstructive changes seen. Stomach and small bowel are otherwise within normal limits. Vascular/Lymphatic: IVC filter is noted in place. Atherosclerotic calcifications of the abdominal aorta are noted. No aneurysmal dilatation is seen. No lymphadenopathy is noted. Reproductive: Status post hysterectomy. No adnexal masses. Other: No abdominal wall hernia or abnormality. No abdominopelvic ascites. Musculoskeletal: Postsurgical changes are noted in the left hip. Degenerative changes of lumbar spine are noted. IMPRESSION: No findings to suggest active GI hemorrhage. Left-sided colostomy with peristomal herniation of small bowel loops although no incarceration is noted. Fatty liver. No acute abnormality is noted. Electronically Signed   By: MInez CatalinaM.D.   On: 04/22/2020 21:14   ECHOCARDIOGRAM COMPLETE  Result Date: 04/23/2020    ECHOCARDIOGRAM REPORT   Patient Name:   DRHYLAN GROSSDate of Exam: 04/23/2020 Medical Rec #:  [id number]   Height:       67.0 in Accession #:    [id number]  Weight:       181.0 lb Date of Birth:  [birth year]   BSA:          1.938 m Patient Age:    89years     BP:           106/64 mmHg Patient Gender: F            HR:           83 bpm. Exam Location:  Inpatient Procedure: 2D Echo, Color Doppler and Cardiac Doppler Indications:    Syncope R55  History:        Patient has prior history of Echocardiogram examinations, most                  recent 10/31/2018. CHF, Pulmonary HTN; Risk Factors:Diabetes,                 Hypertension and Dyslipidemia. Thyroid diseae. Chronic kidney                 disease.  Sonographer:    TDarlina SicilianRDCS Referring Phys: 4Lewie ChamberAKULA IMPRESSIONS  1. Inferior basal hypokinesis abnormal septal motion . Left ventricular ejection fraction, by  estimation, is 45 to 50%. The left ventricle has mildly decreased function. The left ventricle has no regional wall motion abnormalities. There is mild left ventricular hypertrophy. Left ventricular diastolic parameters are consistent with Grade I diastolic dysfunction (impaired relaxation).  2. Right ventricular systolic function is normal. The right ventricular size is normal. There is normal pulmonary artery systolic pressure.  3. The mitral valve is degenerative. No evidence of mitral valve regurgitation. No evidence of mitral stenosis. Moderate mitral annular calcification.  4. The aortic valve is tricuspid. There is mild calcification of the aortic valve. Aortic valve regurgitation is not visualized. Mild aortic valve sclerosis is present, with no evidence of aortic valve stenosis.  5. The inferior vena cava is dilated in size with <50% respiratory variability, suggesting right atrial pressure of 15 mmHg. FINDINGS  Left Ventricle: Inferior basal hypokinesis abnormal septal motion. Left ventricular ejection fraction, by estimation, is 45 to 50%. The left ventricle has mildly decreased function. The left ventricle has no regional wall motion abnormalities. The left ventricular internal cavity size was normal in size. There is mild left ventricular hypertrophy. Left ventricular diastolic parameters are consistent with Grade I diastolic dysfunction (impaired relaxation). Right Ventricle: The right ventricular size is normal. No increase in right ventricular wall thickness. Right ventricular systolic function is normal. There is normal pulmonary artery systolic pressure. The  tricuspid regurgitant velocity is 2.54 m/s, and  with an assumed right atrial pressure of 3 mmHg, the estimated right ventricular systolic pressure is 39.7 mmHg. Left Atrium: Left atrial size was normal in size. Right Atrium: Right atrial size was normal in size. Pericardium: There is no evidence of pericardial effusion. Mitral Valve: The mitral valve is degenerative in appearance. There is moderate thickening of the mitral valve leaflet(s). There is moderate calcification of the mitral valve leaflet(s). Moderate mitral annular calcification. No evidence of mitral valve regurgitation. No evidence of mitral valve stenosis. Tricuspid Valve: The tricuspid valve is normal in structure. Tricuspid valve regurgitation is mild . No evidence of tricuspid stenosis. Aortic Valve: The aortic valve is tricuspid. There is mild calcification of the aortic valve. Aortic valve regurgitation is not visualized. Mild aortic valve sclerosis is present, with no evidence of aortic valve stenosis. Pulmonic Valve: The pulmonic valve was normal in structure. Pulmonic valve regurgitation is not visualized. No evidence of pulmonic stenosis. Aorta: The aortic root is normal in size and structure. Venous: The inferior vena cava is dilated in size with less than 50% respiratory variability, suggesting right atrial pressure of 15 mmHg. IAS/Shunts: No atrial level shunt detected by color flow Doppler.  LEFT VENTRICLE PLAX 2D LVIDd:         3.70 cm  Diastology LVIDs:         2.50 cm  LV e' medial:    7.07 cm/s LV PW:         1.10 cm  LV E/e' medial:  7.4 LV IVS:        1.20 cm  LV e' lateral:   8.70 cm/s LVOT diam:     1.90 cm  LV E/e' lateral: 6.0 LV SV:         39 LV SV Index:   20 LVOT Area:     2.84 cm  RIGHT VENTRICLE RV S prime:     15.20 cm/s LEFT ATRIUM             Index       RIGHT ATRIUM  Index LA diam:        3.10 cm 1.60 cm/m  RA Area:     10.20 cm LA Vol (A2C):   21.7 ml 11.20 ml/m RA Volume:   18.00 ml  9.29 ml/m LA Vol  (A4C):   13.5 ml 6.97 ml/m LA Biplane Vol: 17.7 ml 9.13 ml/m  AORTIC VALVE LVOT Vmax:   99.00 cm/s LVOT Vmean:  68.300 cm/s LVOT VTI:    0.139 m  AORTA Ao Root diam: 2.70 cm Ao Asc diam:  3.50 cm MITRAL VALVE               TRICUSPID VALVE MV Area (PHT): 1.62 cm    TR Peak grad:   25.8 mmHg MV Decel Time: 468 msec    TR Vmax:        254.00 cm/s MV E velocity: 52.40 cm/s MV A velocity: 85.90 cm/s  SHUNTS MV E/A ratio:  0.61        Systemic VTI:  0.14 m                            Systemic Diam: 1.90 cm Jenkins Rouge MD Electronically signed by Jenkins Rouge MD Signature Date/Time: 04/23/2020/3:20:10 PM    Final        Subjective: No chest pain or sob. No bleeding via the colostomy   Discharge Exam: Vitals:   04/26/20 0400 04/26/20 0745  BP: 137/69 (!) 125/57  Pulse: 80 87  Resp:  20  Temp: 98.3 F (36.8 C) 98.2 F (36.8 C)  SpO2: 98% 98%   Vitals:   04/25/20 2021 04/26/20 0000 04/26/20 0400 04/26/20 0745  BP: (!) 130/55 (!) 139/56 137/69 (!) 125/57  Pulse: 97 86 80 87  Resp: '16 18  20  ' Temp: 98.4 F (36.9 C) 98.2 F (36.8 C) 98.3 F (36.8 C) 98.2 F (36.8 C)  TempSrc: Oral Oral Oral Oral  SpO2: 100% 97% 98% 98%  Weight:   84 kg   Height:        General: Pt is alert, awake, not in acute distress Cardiovascular: RRR, S1/S2 +, no rubs, no gallops Respiratory: CTA bilaterally, no wheezing, no rhonchi Abdominal: Soft, NT, ND, bowel sounds + Extremities: no edema, no cyanosis    The results of significant diagnostics from this hospitalization (including imaging, microbiology, ancillary and laboratory) are listed below for reference.     Microbiology: Recent Results (from the past 240 hour(s))  Resp Panel by RT-PCR (Flu A&B, Covid) Nasopharyngeal Swab     Status: None   Collection Time: 04/22/20  6:20 PM   Specimen: Nasopharyngeal Swab; Nasopharyngeal(NP) swabs in vial transport medium  Result Value Ref Range Status   SARS Coronavirus 2 by RT PCR NEGATIVE NEGATIVE Final     Comment: (NOTE) SARS-CoV-2 target nucleic acids are NOT DETECTED.  The SARS-CoV-2 RNA is generally detectable in upper respiratory specimens during the acute phase of infection. The lowest concentration of SARS-CoV-2 viral copies this assay can detect is 138 copies/mL. A negative result does not preclude SARS-Cov-2 infection and should not be used as the sole basis for treatment or other patient management decisions. A negative result may occur with  improper specimen collection/handling, submission of specimen other than nasopharyngeal swab, presence of viral mutation(s) within the areas targeted by this assay, and inadequate number of viral copies(<138 copies/mL). A negative result must be combined with clinical observations, patient history, and epidemiological information. The expected result is  Negative.  Fact Sheet for Patients:  EntrepreneurPulse.com.au  Fact Sheet for Healthcare Providers:  IncredibleEmployment.be  This test is no t yet approved or cleared by the [place] FDA and  has been authorized for detection and/or diagnosis of SARS-CoV-2 by FDA under an Emergency Use Authorization (EUA). This EUA will remain  in effect (meaning this test can be used) for the duration of the COVID-19 declaration under Section 564(b)(1) of the Act, 21 U.S.C.section 360bbb-3(b)(1), unless the authorization is terminated  or revoked sooner.       Influenza A by PCR NEGATIVE NEGATIVE Final   Influenza B by PCR NEGATIVE NEGATIVE Final    Comment: (NOTE) The Xpert Xpress SARS-CoV-2/FLU/RSV plus assay is intended as an aid in the diagnosis of influenza from Nasopharyngeal swab specimens and should not be used as a sole basis for treatment. Nasal washings and aspirates are unacceptable for Xpert Xpress SARS-CoV-2/FLU/RSV testing.  Fact Sheet for Patients: EntrepreneurPulse.com.au  Fact Sheet for Healthcare  Providers: IncredibleEmployment.be  This test is not yet approved or cleared by the [place] FDA and has been authorized for detection and/or diagnosis of SARS-CoV-2 by FDA under an Emergency Use Authorization (EUA). This EUA will remain in effect (meaning this test can be used) for the duration of the COVID-19 declaration under Section 564(b)(1) of the Act, 21 U.S.C. section 360bbb-3(b)(1), unless the authorization is terminated or revoked.  Performed at [facility], [place] [address], [place], [place] [address]      Labs: BNP (last 3 results) No results for input(s): BNP in the last 8760 hours. Basic Metabolic Panel: Recent Labs  Lab 04/22/20 1723 04/23/20 0440 04/24/20 0440 04/25/20 0042  NA 136 138 139 138  K 4.4 4.4 4.2 3.8  CL 106 110 111 111  CO2 21* 22 22 21*  GLUCOSE 149* 151* 115* 84  BUN '23 20 16 16  ' CREATININE 1.28* 1.16* 1.40* 1.32*  CALCIUM 9.0 8.3* 8.4* 8.2*   Liver Function Tests: Recent Labs  Lab 04/22/20 1723  AST 19  ALT 13  ALKPHOS 57  BILITOT 0.6  PROT 6.4*  ALBUMIN 3.4*   No results for input(s): LIPASE, AMYLASE in the last 168 hours. No results for input(s): AMMONIA in the last 168 hours. CBC: Recent Labs  Lab 04/22/20 1723 04/22/20 2132 04/23/20 1138 04/23/20 1903 04/23/20 2112 04/24/20 0440 04/25/20 0859 04/26/20 0101 04/26/20 0616  WBC 9.3   < > 10.7* 15.9*  --  12.5* 11.2* 9.3  --   NEUTROABS 4.8  --   --   --   --  8.7* 6.2  --   --   HGB 13.9   < > 9.8* 9.6* 9.0* 8.8* 8.3* 7.5* 7.7*  HCT 45.6   < > 31.5* 31.1* 29.3* 28.2* 26.4* 24.4* 24.6*  MCV 91.9   < > 91.6 91.7  --  91.3 91.0 93.1  --   PLT 248   < > 185 184  --  166 176 148*  --    < > = values in this interval not displayed.   Cardiac Enzymes: No results for input(s): CKTOTAL, CKMB, CKMBINDEX, TROPONINI in the last 168 hours. BNP: Invalid input(s): POCBNP CBG: Recent Labs  Lab 04/25/20 1639 04/25/20 2047 04/26/20 0002  04/26/20 0347 04/26/20 0744  GLUCAP 156* 199* 106* 96 105*   D-Dimer No results for input(s): DDIMER in the last 72 hours. Hgb A1c No results for input(s): HGBA1C in the last 72 hours. Lipid Profile No results  for input(s): CHOL, HDL, LDLCALC, TRIG, CHOLHDL, LDLDIRECT in the last 72 hours. Thyroid function studies No results for input(s): TSH, T4TOTAL, T3FREE, THYROIDAB in the last 72 hours.  Invalid input(s): FREET3 Anemia work up Recent Labs    04/26/20 0616  VITAMINB12 312  FOLATE 4.5*  FERRITIN 59  TIBC 242*  IRON 16*  RETICCTPCT 3.0   Urinalysis    Component Value Date/Time   COLORURINE YELLOW 05/21/2018 0635   APPEARANCEUR HAZY (A) 05/21/2018 0635   LABSPEC 1.020 05/21/2018 0635   PHURINE 5.0 05/21/2018 0635   GLUCOSEU NEGATIVE 05/21/2018 0635   HGBUR NEGATIVE 05/21/2018 0635   BILIRUBINUR NEGATIVE 05/21/2018 0635   KETONESUR NEGATIVE 05/21/2018 0635   PROTEINUR NEGATIVE 05/21/2018 0635   UROBILINOGEN 0.2 03/14/2017 1102   NITRITE NEGATIVE 05/21/2018 0635   LEUKOCYTESUR NEGATIVE 05/21/2018 0635   Sepsis Labs Invalid input(s): PROCALCITONIN,  WBC,  LACTICIDVEN Microbiology Recent Results (from the past 240 hour(s))  Resp Panel by RT-PCR (Flu A&B, Covid) Nasopharyngeal Swab     Status: None   Collection Time: 04/22/20  6:20 PM   Specimen: Nasopharyngeal Swab; Nasopharyngeal(NP) swabs in vial transport medium  Result Value Ref Range Status   SARS Coronavirus 2 by RT PCR NEGATIVE NEGATIVE Final    Comment: (NOTE) SARS-CoV-2 target nucleic acids are NOT DETECTED.  The SARS-CoV-2 RNA is generally detectable in upper respiratory specimens during the acute phase of infection. The lowest concentration of SARS-CoV-2 viral copies this assay can detect is 138 copies/mL. A negative result does not preclude SARS-Cov-2 infection and should not be used as the sole basis for treatment or other patient management decisions. A negative result may occur with  improper  specimen collection/handling, submission of specimen other than nasopharyngeal swab, presence of viral mutation(s) within the areas targeted by this assay, and inadequate number of viral copies(<138 copies/mL). A negative result must be combined with clinical observations, patient history, and epidemiological information. The expected result is Negative.  Fact Sheet for Patients:  EntrepreneurPulse.com.au  Fact Sheet for Healthcare Providers:  IncredibleEmployment.be  This test is no t yet approved or cleared by the [place] FDA and  has been authorized for detection and/or diagnosis of SARS-CoV-2 by FDA under an Emergency Use Authorization (EUA). This EUA will remain  in effect (meaning this test can be used) for the duration of the COVID-19 declaration under Section 564(b)(1) of the Act, 21 U.S.C.section 360bbb-3(b)(1), unless the authorization is terminated  or revoked sooner.       Influenza A by PCR NEGATIVE NEGATIVE Final   Influenza B by PCR NEGATIVE NEGATIVE Final    Comment: (NOTE) The Xpert Xpress SARS-CoV-2/FLU/RSV plus assay is intended as an aid in the diagnosis of influenza from Nasopharyngeal swab specimens and should not be used as a sole basis for treatment. Nasal washings and aspirates are unacceptable for Xpert Xpress SARS-CoV-2/FLU/RSV testing.  Fact Sheet for Patients: EntrepreneurPulse.com.au  Fact Sheet for Healthcare Providers: IncredibleEmployment.be  This test is not yet approved or cleared by the [place] FDA and has been authorized for detection and/or diagnosis of SARS-CoV-2 by FDA under an Emergency Use Authorization (EUA). This EUA will remain in effect (meaning this test can be used) for the duration of the COVID-19 declaration under Section 564(b)(1) of the Act, 21 U.S.C. section 360bbb-3(b)(1), unless the authorization is terminated or revoked.  Performed at  [facility], [place] [address] [place], [place] [address]      Time coordinating discharge: 32 minutes.  SIGNED:   Hosie Poisson, MD  [facility] 04/26/2020, 5:16 PM

## 2020-04-27 ENCOUNTER — Telehealth: Payer: Self-pay

## 2020-04-27 ENCOUNTER — Telehealth: Payer: Self-pay | Admitting: Family Medicine

## 2020-04-27 NOTE — Telephone Encounter (Signed)
Patient is scheduled for 05/13 at 2 PM

## 2020-04-27 NOTE — Telephone Encounter (Cosign Needed)
Transition Care Management Unsuccessful Follow-up Telephone Call  Date of discharge and from where:  Hannah [name]   04/26/2020  Attempts:  1st Attempt  Reason for unsuccessful TCM follow-up call:  Left voice message

## 2020-04-27 NOTE — Telephone Encounter (Signed)
Yes you can use same day!

## 2020-04-27 NOTE — Telephone Encounter (Signed)
The patient called back wanting to know if Dr. Hassan [name] can see her sooner for her HFU because the next appointment is June 10 and she said that she would be dead, buried, and forgot about then.   Can we use one of your same day appointments for this patient?

## 2020-04-27 NOTE — Telephone Encounter (Signed)
Patient called and we will get her in asap for followup

## 2020-04-27 NOTE — Telephone Encounter (Signed)
Patient needs a hospital follow-up, however the first available isn't until 06/12/20.  There are Same Day slots available though.  Patient would like a return call.

## 2020-04-28 ENCOUNTER — Telehealth: Payer: Self-pay | Admitting: Family Medicine

## 2020-04-28 DIAGNOSIS — I130 Hypertensive heart and chronic kidney disease with heart failure and stage 1 through stage 4 chronic kidney disease, or unspecified chronic kidney disease: Secondary | ICD-10-CM | POA: Diagnosis not present

## 2020-04-28 DIAGNOSIS — K922 Gastrointestinal hemorrhage, unspecified: Secondary | ICD-10-CM | POA: Diagnosis not present

## 2020-04-28 DIAGNOSIS — E1122 Type 2 diabetes mellitus with diabetic chronic kidney disease: Secondary | ICD-10-CM | POA: Diagnosis not present

## 2020-04-28 DIAGNOSIS — N1832 Chronic kidney disease, stage 3b: Secondary | ICD-10-CM | POA: Diagnosis not present

## 2020-04-28 DIAGNOSIS — E039 Hypothyroidism, unspecified: Secondary | ICD-10-CM | POA: Diagnosis not present

## 2020-04-28 DIAGNOSIS — M1990 Unspecified osteoarthritis, unspecified site: Secondary | ICD-10-CM | POA: Diagnosis not present

## 2020-04-28 DIAGNOSIS — I5042 Chronic combined systolic (congestive) and diastolic (congestive) heart failure: Secondary | ICD-10-CM | POA: Diagnosis not present

## 2020-04-28 DIAGNOSIS — K219 Gastro-esophageal reflux disease without esophagitis: Secondary | ICD-10-CM | POA: Diagnosis not present

## 2020-04-28 DIAGNOSIS — E1142 Type 2 diabetes mellitus with diabetic polyneuropathy: Secondary | ICD-10-CM | POA: Diagnosis not present

## 2020-04-28 DIAGNOSIS — Z9181 History of falling: Secondary | ICD-10-CM | POA: Diagnosis not present

## 2020-04-28 DIAGNOSIS — E785 Hyperlipidemia, unspecified: Secondary | ICD-10-CM | POA: Diagnosis not present

## 2020-04-28 DIAGNOSIS — Z87891 Personal history of nicotine dependence: Secondary | ICD-10-CM | POA: Diagnosis not present

## 2020-04-28 DIAGNOSIS — K5790 Diverticulosis of intestine, part unspecified, without perforation or abscess without bleeding: Secondary | ICD-10-CM | POA: Diagnosis not present

## 2020-04-28 NOTE — Telephone Encounter (Signed)
Okay 

## 2020-04-28 NOTE — Telephone Encounter (Signed)
Left a detailed message at [facility] with the approval for orders as below.

## 2020-04-28 NOTE — Telephone Encounter (Signed)
Jeri from [facility] call and need order for 2 x a wk for 2 wk's and l x a wk for l wk.

## 2020-04-30 ENCOUNTER — Other Ambulatory Visit: Payer: Self-pay | Admitting: Family Medicine

## 2020-04-30 DIAGNOSIS — E1122 Type 2 diabetes mellitus with diabetic chronic kidney disease: Secondary | ICD-10-CM | POA: Diagnosis not present

## 2020-04-30 DIAGNOSIS — E1142 Type 2 diabetes mellitus with diabetic polyneuropathy: Secondary | ICD-10-CM | POA: Diagnosis not present

## 2020-04-30 DIAGNOSIS — K219 Gastro-esophageal reflux disease without esophagitis: Secondary | ICD-10-CM | POA: Diagnosis not present

## 2020-04-30 DIAGNOSIS — E039 Hypothyroidism, unspecified: Secondary | ICD-10-CM | POA: Diagnosis not present

## 2020-04-30 DIAGNOSIS — E785 Hyperlipidemia, unspecified: Secondary | ICD-10-CM | POA: Diagnosis not present

## 2020-04-30 DIAGNOSIS — I5042 Chronic combined systolic (congestive) and diastolic (congestive) heart failure: Secondary | ICD-10-CM | POA: Diagnosis not present

## 2020-04-30 DIAGNOSIS — Z0000 Encounter for general adult medical examination without abnormal findings: Secondary | ICD-10-CM

## 2020-04-30 DIAGNOSIS — K922 Gastrointestinal hemorrhage, unspecified: Secondary | ICD-10-CM | POA: Diagnosis not present

## 2020-04-30 DIAGNOSIS — M1990 Unspecified osteoarthritis, unspecified site: Secondary | ICD-10-CM | POA: Diagnosis not present

## 2020-04-30 DIAGNOSIS — Z9181 History of falling: Secondary | ICD-10-CM | POA: Diagnosis not present

## 2020-04-30 DIAGNOSIS — N1832 Chronic kidney disease, stage 3b: Secondary | ICD-10-CM | POA: Diagnosis not present

## 2020-04-30 DIAGNOSIS — K5790 Diverticulosis of intestine, part unspecified, without perforation or abscess without bleeding: Secondary | ICD-10-CM | POA: Diagnosis not present

## 2020-04-30 DIAGNOSIS — Z1231 Encounter for screening mammogram for malignant neoplasm of breast: Secondary | ICD-10-CM

## 2020-04-30 DIAGNOSIS — I130 Hypertensive heart and chronic kidney disease with heart failure and stage 1 through stage 4 chronic kidney disease, or unspecified chronic kidney disease: Secondary | ICD-10-CM | POA: Diagnosis not present

## 2020-04-30 DIAGNOSIS — Z87891 Personal history of nicotine dependence: Secondary | ICD-10-CM | POA: Diagnosis not present

## 2020-04-30 NOTE — Telephone Encounter (Signed)
Pt has a follow visit schedule and spoke with Dr. Alben Deeds nurse

## 2020-05-01 ENCOUNTER — Other Ambulatory Visit: Payer: Self-pay

## 2020-05-01 ENCOUNTER — Ambulatory Visit: Payer: Medicare Other | Admitting: *Deleted

## 2020-05-01 ENCOUNTER — Telehealth: Payer: Self-pay | Admitting: Family Medicine

## 2020-05-01 DIAGNOSIS — Z9181 History of falling: Secondary | ICD-10-CM | POA: Diagnosis not present

## 2020-05-01 DIAGNOSIS — I2699 Other pulmonary embolism without acute cor pulmonale: Secondary | ICD-10-CM

## 2020-05-01 DIAGNOSIS — M1990 Unspecified osteoarthritis, unspecified site: Secondary | ICD-10-CM | POA: Diagnosis not present

## 2020-05-01 DIAGNOSIS — K219 Gastro-esophageal reflux disease without esophagitis: Secondary | ICD-10-CM | POA: Diagnosis not present

## 2020-05-01 DIAGNOSIS — Z87891 Personal history of nicotine dependence: Secondary | ICD-10-CM | POA: Diagnosis not present

## 2020-05-01 DIAGNOSIS — Z5181 Encounter for therapeutic drug level monitoring: Secondary | ICD-10-CM | POA: Diagnosis not present

## 2020-05-01 DIAGNOSIS — Z7901 Long term (current) use of anticoagulants: Secondary | ICD-10-CM | POA: Diagnosis not present

## 2020-05-01 DIAGNOSIS — E785 Hyperlipidemia, unspecified: Secondary | ICD-10-CM | POA: Diagnosis not present

## 2020-05-01 DIAGNOSIS — N1832 Chronic kidney disease, stage 3b: Secondary | ICD-10-CM | POA: Diagnosis not present

## 2020-05-01 DIAGNOSIS — I130 Hypertensive heart and chronic kidney disease with heart failure and stage 1 through stage 4 chronic kidney disease, or unspecified chronic kidney disease: Secondary | ICD-10-CM | POA: Diagnosis not present

## 2020-05-01 DIAGNOSIS — E1142 Type 2 diabetes mellitus with diabetic polyneuropathy: Secondary | ICD-10-CM | POA: Diagnosis not present

## 2020-05-01 DIAGNOSIS — K922 Gastrointestinal hemorrhage, unspecified: Secondary | ICD-10-CM | POA: Diagnosis not present

## 2020-05-01 DIAGNOSIS — E1122 Type 2 diabetes mellitus with diabetic chronic kidney disease: Secondary | ICD-10-CM | POA: Diagnosis not present

## 2020-05-01 DIAGNOSIS — E039 Hypothyroidism, unspecified: Secondary | ICD-10-CM | POA: Diagnosis not present

## 2020-05-01 DIAGNOSIS — K5790 Diverticulosis of intestine, part unspecified, without perforation or abscess without bleeding: Secondary | ICD-10-CM | POA: Diagnosis not present

## 2020-05-01 DIAGNOSIS — I5042 Chronic combined systolic (congestive) and diastolic (congestive) heart failure: Secondary | ICD-10-CM | POA: Diagnosis not present

## 2020-05-01 LAB — POCT INR: INR: 2 (ref 2.0–3.0)

## 2020-05-01 NOTE — Telephone Encounter (Signed)
ok 

## 2020-05-01 NOTE — Patient Instructions (Signed)
Description   Take 1.5 tablets today of warfarin, then continue to take 1/2 a tablet daily except for 1 tablet on Mondays and Fridays. Recheck INR on 5/4 by homehealth.

## 2020-05-01 NOTE — Telephone Encounter (Signed)
Donita from [facility] is needing verbal orders for skilled nursing orders  Frequency  1 time a week for 4 weeks  Donita [phone] [facility]

## 2020-05-01 NOTE — Telephone Encounter (Signed)
Spoke with Donita and informed her of the approval for orders as below.

## 2020-05-04 DIAGNOSIS — E039 Hypothyroidism, unspecified: Secondary | ICD-10-CM | POA: Diagnosis not present

## 2020-05-04 DIAGNOSIS — Z9181 History of falling: Secondary | ICD-10-CM | POA: Diagnosis not present

## 2020-05-04 DIAGNOSIS — E1142 Type 2 diabetes mellitus with diabetic polyneuropathy: Secondary | ICD-10-CM | POA: Diagnosis not present

## 2020-05-04 DIAGNOSIS — Z87891 Personal history of nicotine dependence: Secondary | ICD-10-CM | POA: Diagnosis not present

## 2020-05-04 DIAGNOSIS — K5790 Diverticulosis of intestine, part unspecified, without perforation or abscess without bleeding: Secondary | ICD-10-CM | POA: Diagnosis not present

## 2020-05-04 DIAGNOSIS — E785 Hyperlipidemia, unspecified: Secondary | ICD-10-CM | POA: Diagnosis not present

## 2020-05-04 DIAGNOSIS — M1990 Unspecified osteoarthritis, unspecified site: Secondary | ICD-10-CM | POA: Diagnosis not present

## 2020-05-04 DIAGNOSIS — I130 Hypertensive heart and chronic kidney disease with heart failure and stage 1 through stage 4 chronic kidney disease, or unspecified chronic kidney disease: Secondary | ICD-10-CM | POA: Diagnosis not present

## 2020-05-04 DIAGNOSIS — I5042 Chronic combined systolic (congestive) and diastolic (congestive) heart failure: Secondary | ICD-10-CM | POA: Diagnosis not present

## 2020-05-04 DIAGNOSIS — N1832 Chronic kidney disease, stage 3b: Secondary | ICD-10-CM | POA: Diagnosis not present

## 2020-05-04 DIAGNOSIS — K922 Gastrointestinal hemorrhage, unspecified: Secondary | ICD-10-CM | POA: Diagnosis not present

## 2020-05-04 DIAGNOSIS — K219 Gastro-esophageal reflux disease without esophagitis: Secondary | ICD-10-CM | POA: Diagnosis not present

## 2020-05-04 DIAGNOSIS — E1122 Type 2 diabetes mellitus with diabetic chronic kidney disease: Secondary | ICD-10-CM | POA: Diagnosis not present

## 2020-05-06 ENCOUNTER — Ambulatory Visit (INDEPENDENT_AMBULATORY_CARE_PROVIDER_SITE_OTHER): Payer: Medicare Other | Admitting: Pharmacist

## 2020-05-06 ENCOUNTER — Telehealth: Payer: Self-pay | Admitting: Family Medicine

## 2020-05-06 DIAGNOSIS — Z87891 Personal history of nicotine dependence: Secondary | ICD-10-CM | POA: Diagnosis not present

## 2020-05-06 DIAGNOSIS — E1122 Type 2 diabetes mellitus with diabetic chronic kidney disease: Secondary | ICD-10-CM | POA: Diagnosis not present

## 2020-05-06 DIAGNOSIS — M1990 Unspecified osteoarthritis, unspecified site: Secondary | ICD-10-CM | POA: Diagnosis not present

## 2020-05-06 DIAGNOSIS — K5790 Diverticulosis of intestine, part unspecified, without perforation or abscess without bleeding: Secondary | ICD-10-CM | POA: Diagnosis not present

## 2020-05-06 DIAGNOSIS — E1142 Type 2 diabetes mellitus with diabetic polyneuropathy: Secondary | ICD-10-CM | POA: Diagnosis not present

## 2020-05-06 DIAGNOSIS — Z5181 Encounter for therapeutic drug level monitoring: Secondary | ICD-10-CM | POA: Diagnosis not present

## 2020-05-06 DIAGNOSIS — K922 Gastrointestinal hemorrhage, unspecified: Secondary | ICD-10-CM | POA: Diagnosis not present

## 2020-05-06 DIAGNOSIS — E039 Hypothyroidism, unspecified: Secondary | ICD-10-CM | POA: Diagnosis not present

## 2020-05-06 DIAGNOSIS — Z9181 History of falling: Secondary | ICD-10-CM | POA: Diagnosis not present

## 2020-05-06 DIAGNOSIS — I130 Hypertensive heart and chronic kidney disease with heart failure and stage 1 through stage 4 chronic kidney disease, or unspecified chronic kidney disease: Secondary | ICD-10-CM | POA: Diagnosis not present

## 2020-05-06 DIAGNOSIS — N1832 Chronic kidney disease, stage 3b: Secondary | ICD-10-CM | POA: Diagnosis not present

## 2020-05-06 DIAGNOSIS — K219 Gastro-esophageal reflux disease without esophagitis: Secondary | ICD-10-CM | POA: Diagnosis not present

## 2020-05-06 DIAGNOSIS — E785 Hyperlipidemia, unspecified: Secondary | ICD-10-CM | POA: Diagnosis not present

## 2020-05-06 DIAGNOSIS — I5042 Chronic combined systolic (congestive) and diastolic (congestive) heart failure: Secondary | ICD-10-CM | POA: Diagnosis not present

## 2020-05-06 LAB — POCT INR: INR: 2.4 (ref 2.0–3.0)

## 2020-05-06 NOTE — Telephone Encounter (Signed)
Amy from [facility] call and stated pt need some labs a BMP and CBC want to know it you want her to do the labs she will. Amy stated she is sending a fax over.

## 2020-05-07 NOTE — Telephone Encounter (Signed)
Yes she can do those. Would be nice to have them before her scheduled follow up with me.

## 2020-05-07 NOTE — Telephone Encounter (Signed)
Spoke with Hannah [name] and informed her of the orders as below.  Hannah [name] stated she will have the tests done when she returns to the patients home on 5/11.

## 2020-05-08 DIAGNOSIS — E1122 Type 2 diabetes mellitus with diabetic chronic kidney disease: Secondary | ICD-10-CM | POA: Diagnosis not present

## 2020-05-08 DIAGNOSIS — Z9181 History of falling: Secondary | ICD-10-CM | POA: Diagnosis not present

## 2020-05-08 DIAGNOSIS — N1832 Chronic kidney disease, stage 3b: Secondary | ICD-10-CM | POA: Diagnosis not present

## 2020-05-08 DIAGNOSIS — K5790 Diverticulosis of intestine, part unspecified, without perforation or abscess without bleeding: Secondary | ICD-10-CM | POA: Diagnosis not present

## 2020-05-08 DIAGNOSIS — I130 Hypertensive heart and chronic kidney disease with heart failure and stage 1 through stage 4 chronic kidney disease, or unspecified chronic kidney disease: Secondary | ICD-10-CM | POA: Diagnosis not present

## 2020-05-08 DIAGNOSIS — M1990 Unspecified osteoarthritis, unspecified site: Secondary | ICD-10-CM | POA: Diagnosis not present

## 2020-05-08 DIAGNOSIS — E1142 Type 2 diabetes mellitus with diabetic polyneuropathy: Secondary | ICD-10-CM | POA: Diagnosis not present

## 2020-05-08 DIAGNOSIS — Z87891 Personal history of nicotine dependence: Secondary | ICD-10-CM | POA: Diagnosis not present

## 2020-05-08 DIAGNOSIS — E039 Hypothyroidism, unspecified: Secondary | ICD-10-CM | POA: Diagnosis not present

## 2020-05-08 DIAGNOSIS — E785 Hyperlipidemia, unspecified: Secondary | ICD-10-CM | POA: Diagnosis not present

## 2020-05-08 DIAGNOSIS — K219 Gastro-esophageal reflux disease without esophagitis: Secondary | ICD-10-CM | POA: Diagnosis not present

## 2020-05-08 DIAGNOSIS — K922 Gastrointestinal hemorrhage, unspecified: Secondary | ICD-10-CM | POA: Diagnosis not present

## 2020-05-08 DIAGNOSIS — I5042 Chronic combined systolic (congestive) and diastolic (congestive) heart failure: Secondary | ICD-10-CM | POA: Diagnosis not present

## 2020-05-11 ENCOUNTER — Telehealth: Payer: Self-pay | Admitting: Family Medicine

## 2020-05-11 NOTE — Telephone Encounter (Signed)
Spoke with the pts daughter and informed her of the message below.  She is aware the paperwork will be discussed during the visit.  Paperwork given back to PCP pending appt.

## 2020-05-11 NOTE — Telephone Encounter (Signed)
Will her daughter be with her for her appointment Friday? I would prefer to wait until then to complete. I haven't seen Hannah [name] in over 6 months; so it is difficult for me to complete appropriately without talking to them first to get a better idea of how her daughter is helping/how this is affecting her.

## 2020-05-11 NOTE — Telephone Encounter (Signed)
Patient's daughter dropped off paperwork that she would like Dr. Hassan Rowan to complete.  Daughter is requesting a call at [phone] when form is completed and ready for pickup.  Form will be placed in folder.  Please advise.

## 2020-05-12 ENCOUNTER — Telehealth: Payer: Self-pay | Admitting: Family Medicine

## 2020-05-12 DIAGNOSIS — K5790 Diverticulosis of intestine, part unspecified, without perforation or abscess without bleeding: Secondary | ICD-10-CM | POA: Diagnosis not present

## 2020-05-12 DIAGNOSIS — I5042 Chronic combined systolic (congestive) and diastolic (congestive) heart failure: Secondary | ICD-10-CM | POA: Diagnosis not present

## 2020-05-12 DIAGNOSIS — N1832 Chronic kidney disease, stage 3b: Secondary | ICD-10-CM | POA: Diagnosis not present

## 2020-05-12 DIAGNOSIS — K219 Gastro-esophageal reflux disease without esophagitis: Secondary | ICD-10-CM | POA: Diagnosis not present

## 2020-05-12 DIAGNOSIS — Z87891 Personal history of nicotine dependence: Secondary | ICD-10-CM | POA: Diagnosis not present

## 2020-05-12 DIAGNOSIS — E1122 Type 2 diabetes mellitus with diabetic chronic kidney disease: Secondary | ICD-10-CM | POA: Diagnosis not present

## 2020-05-12 DIAGNOSIS — E1142 Type 2 diabetes mellitus with diabetic polyneuropathy: Secondary | ICD-10-CM | POA: Diagnosis not present

## 2020-05-12 DIAGNOSIS — M1990 Unspecified osteoarthritis, unspecified site: Secondary | ICD-10-CM | POA: Diagnosis not present

## 2020-05-12 DIAGNOSIS — E785 Hyperlipidemia, unspecified: Secondary | ICD-10-CM | POA: Diagnosis not present

## 2020-05-12 DIAGNOSIS — Z9181 History of falling: Secondary | ICD-10-CM | POA: Diagnosis not present

## 2020-05-12 DIAGNOSIS — K922 Gastrointestinal hemorrhage, unspecified: Secondary | ICD-10-CM | POA: Diagnosis not present

## 2020-05-12 DIAGNOSIS — I130 Hypertensive heart and chronic kidney disease with heart failure and stage 1 through stage 4 chronic kidney disease, or unspecified chronic kidney disease: Secondary | ICD-10-CM | POA: Diagnosis not present

## 2020-05-12 DIAGNOSIS — E039 Hypothyroidism, unspecified: Secondary | ICD-10-CM | POA: Diagnosis not present

## 2020-05-12 NOTE — Progress Notes (Signed)
  Chronic Care Management   Note  05/12/2020 Name: Hannah [name] MRN: [id number] DOB: May 23, 1940  Hannah [name] is a 80 y.o. year old female who is a primary care patient of Koberlein, Paris Lore, MD. I reached out to [name] by phone today in response to a referral sent by Hannah [name]'s PCP, Wynn Banker, MD.   Hannah [name] was given information about Chronic Care Management services today including:  1. CCM service includes personalized support from designated clinical staff supervised by her physician, including individualized plan of care and coordination with other care providers 2. 24/7 contact phone numbers for assistance for urgent and routine care needs. 3. Service will only be billed when office clinical staff spend 20 minutes or more in a month to coordinate care. 4. Only one practitioner may furnish and bill the service in a calendar month. 5. The patient may stop CCM services at any time (effective at the end of the month) by phone call to the office staff.   Patient agreed to services and verbal consent obtained.   Follow up plan:   Hannah [name] [employer] Scheduler

## 2020-05-13 ENCOUNTER — Ambulatory Visit (INDEPENDENT_AMBULATORY_CARE_PROVIDER_SITE_OTHER): Payer: Medicare Other

## 2020-05-13 DIAGNOSIS — N189 Chronic kidney disease, unspecified: Secondary | ICD-10-CM | POA: Diagnosis not present

## 2020-05-13 DIAGNOSIS — Z87891 Personal history of nicotine dependence: Secondary | ICD-10-CM | POA: Diagnosis not present

## 2020-05-13 DIAGNOSIS — N1832 Chronic kidney disease, stage 3b: Secondary | ICD-10-CM | POA: Diagnosis not present

## 2020-05-13 DIAGNOSIS — Z9181 History of falling: Secondary | ICD-10-CM | POA: Diagnosis not present

## 2020-05-13 DIAGNOSIS — Z86711 Personal history of pulmonary embolism: Secondary | ICD-10-CM

## 2020-05-13 DIAGNOSIS — K219 Gastro-esophageal reflux disease without esophagitis: Secondary | ICD-10-CM | POA: Diagnosis not present

## 2020-05-13 DIAGNOSIS — I5042 Chronic combined systolic (congestive) and diastolic (congestive) heart failure: Secondary | ICD-10-CM | POA: Diagnosis not present

## 2020-05-13 DIAGNOSIS — I509 Heart failure, unspecified: Secondary | ICD-10-CM | POA: Diagnosis not present

## 2020-05-13 DIAGNOSIS — E1122 Type 2 diabetes mellitus with diabetic chronic kidney disease: Secondary | ICD-10-CM | POA: Diagnosis not present

## 2020-05-13 DIAGNOSIS — E785 Hyperlipidemia, unspecified: Secondary | ICD-10-CM | POA: Diagnosis not present

## 2020-05-13 DIAGNOSIS — Z5181 Encounter for therapeutic drug level monitoring: Secondary | ICD-10-CM | POA: Diagnosis not present

## 2020-05-13 DIAGNOSIS — M1990 Unspecified osteoarthritis, unspecified site: Secondary | ICD-10-CM | POA: Diagnosis not present

## 2020-05-13 DIAGNOSIS — K922 Gastrointestinal hemorrhage, unspecified: Secondary | ICD-10-CM | POA: Diagnosis not present

## 2020-05-13 DIAGNOSIS — D500 Iron deficiency anemia secondary to blood loss (chronic): Secondary | ICD-10-CM | POA: Diagnosis not present

## 2020-05-13 DIAGNOSIS — E039 Hypothyroidism, unspecified: Secondary | ICD-10-CM | POA: Diagnosis not present

## 2020-05-13 DIAGNOSIS — I130 Hypertensive heart and chronic kidney disease with heart failure and stage 1 through stage 4 chronic kidney disease, or unspecified chronic kidney disease: Secondary | ICD-10-CM | POA: Diagnosis not present

## 2020-05-13 DIAGNOSIS — E1142 Type 2 diabetes mellitus with diabetic polyneuropathy: Secondary | ICD-10-CM | POA: Diagnosis not present

## 2020-05-13 DIAGNOSIS — K5790 Diverticulosis of intestine, part unspecified, without perforation or abscess without bleeding: Secondary | ICD-10-CM | POA: Diagnosis not present

## 2020-05-13 LAB — POCT INR: INR: 3.2 — AB (ref 2.0–3.0)

## 2020-05-13 NOTE — Patient Instructions (Signed)
Description   Spoke with Hannah [name], [facility] nurse while in the home. Advised to have patient continue on same dosage 1/2 a tablet daily except for 1 tablet on Mondays and Fridays. Recheck INR in 1 week by homehealth.

## 2020-05-14 ENCOUNTER — Other Ambulatory Visit: Payer: Self-pay

## 2020-05-15 ENCOUNTER — Encounter: Payer: Self-pay | Admitting: Family Medicine

## 2020-05-15 ENCOUNTER — Telehealth: Payer: Self-pay | Admitting: Family Medicine

## 2020-05-15 ENCOUNTER — Ambulatory Visit (INDEPENDENT_AMBULATORY_CARE_PROVIDER_SITE_OTHER): Payer: Medicare Other | Admitting: Family Medicine

## 2020-05-15 VITALS — BP 128/74 | HR 73 | Temp 97.8°F | Ht 67.0 in | Wt 184.0 lb

## 2020-05-15 DIAGNOSIS — Z7901 Long term (current) use of anticoagulants: Secondary | ICD-10-CM | POA: Diagnosis not present

## 2020-05-15 DIAGNOSIS — I5032 Chronic diastolic (congestive) heart failure: Secondary | ICD-10-CM

## 2020-05-15 DIAGNOSIS — E038 Other specified hypothyroidism: Secondary | ICD-10-CM | POA: Diagnosis not present

## 2020-05-15 DIAGNOSIS — I10 Essential (primary) hypertension: Secondary | ICD-10-CM | POA: Diagnosis not present

## 2020-05-15 DIAGNOSIS — D500 Iron deficiency anemia secondary to blood loss (chronic): Secondary | ICD-10-CM | POA: Diagnosis not present

## 2020-05-15 DIAGNOSIS — E1142 Type 2 diabetes mellitus with diabetic polyneuropathy: Secondary | ICD-10-CM

## 2020-05-15 MED ORDER — VITAMIN B-12 1000 MCG PO TABS: 1000.0000 ug | ORAL_TABLET | Freq: Every day | ORAL | Status: DC

## 2020-05-15 NOTE — Progress Notes (Signed)
Hannah [name] DOB: 1940/02/09 Encounter date: 05/15/2020  This is a 80 y.o. female who presents with Chief Complaint  Patient presents with  . Hospitalization Follow-up    History of present illness: Not feeling well. Just feels tired. States that yesterday and today not feeling well. No pain today, no fevers, chills.   Was admitted 4/20; discharged 4/24. Had acute GI bleed in colostomy bag with Hb drop. This resolved in hospital. Feet swelling up. Appetite not as good as it was in past; daughter states very poor. Can feel tired even when laying down.   Legs have been swollen since last week. She is taking furosemide in the morning. Does urinate a lot after taking it.   Not feeling dizzy, light headed.   No acid reflux still taking protonix 47m.   Daughter with her here today.   There is aid and therapist coming out to the house now 3 days/week. Nurse also coming now.   Had FMLA paperwork done in past when under Dr. KJulianne Ricecare. Daughter is primary support for Leiliana - during hospitalization recently she was out for a couple of days. Daughter doesn't have PTO time; time out is counted against her. Hasn't been able to go with her to specialty visits due to time from work.   Routinely follows with heart doc, surgeon, pcp.   HTN: does check on wrist daily and home nurse has been checking too. A lot of times repeated by aid- she isn't sure about numbers. Maybe 1553'Zsystolic and high 748'[id number]-LMB886'Ldiastolic.    Allergies  Allergen Reactions  . Augmentin [Amoxicillin-Pot Clavulanate] Itching and Other (See Comments)    Severe vaginal itching  . Tranxene [Clorazepate] Itching  . Penicillins Itching and Rash    Has patient had a PCN reaction causing immediate rash, facial/tongue/throat swelling, SOB or lightheadedness with hypotension: Yes Has patient had a PCN reaction causing severe rash involving mucus membranes or skin necrosis: No Has patient had a PCN reaction that required  hospitalization: No Has patient had a PCN reaction occurring within the last 10 years: No If all of the above answers are "NO", then may proceed with Cephalosporin use.    Current Meds  Medication Sig  . acetaminophen (TYLENOL) 500 MG tablet Take 500 mg by mouth every 6 (six) hours as needed for mild pain.   . Blood Glucose Monitoring Suppl (ONETOUCH VERIO) w/Device KIT 1 each by Does not apply route as directed.  . diclofenac Sodium (VOLTAREN) 1 % GEL Apply 4 g topically 4 (four) times daily. (Patient taking differently: Apply 4 g topically daily as needed.)  . EUTHYROX 25 MCG tablet TAKE 1 TABLET BY MOUTH ONCE DAILY BEFORE BREAKFAST (Patient taking differently: Take 25 mcg by mouth daily before breakfast.)  . furosemide (LASIX) 20 MG tablet Take 1 tablet (20 mg total) by mouth daily as needed for fluid or edema.  .Marland Kitchenglucose blood (ONETOUCH VERIO) test strip USE AS DIRECTED ONCE  A  DAY  . metFORMIN (GLUCOPHAGE-XR) 500 MG 24 hr tablet Take 1 tablet by mouth once daily with breakfast  . metoprolol tartrate (LOPRESSOR) 25 MG tablet Take 1 tablet by mouth twice daily (Patient taking differently: Take 25 mg by mouth 2 (two) times daily.)  . OneTouch Delica Lancets 354GMISC USE 1  TO CHECK GLUCOSE ONCE DAILY  . pantoprazole (PROTONIX) 40 MG tablet Take 1 tablet (40 mg total) by mouth daily.  . simvastatin (ZOCOR) 20 MG tablet Take 1 tablet (20 mg total)  by mouth at bedtime.  . vitamin B-12 (CYANOCOBALAMIN) 1000 MCG tablet Take 1 tablet (1,000 mcg total) by mouth daily.  . vitamin C (ASCORBIC ACID) 500 MG tablet Take 500 mg by mouth daily.   Marland Kitchen warfarin (COUMADIN) 5 MG tablet TAKE 1/2 TO 1 (ONE-HALF TO ONE) TABLET BY MOUTH DAILY AS DIRECTED BY  COUMADIN  CLINIC (Patient taking differently: Take 2.5-5 mg by mouth See admin instructions. Take 5 mg tablet by mouth on Mondays and Fridays, then take 2.5 mg tablet by mouth all other days per patient)  . [DISCONTINUED] Cyanocobalamin (VITAMIN B-12 PO) Take 1  tablet by mouth daily.    Review of Systems  Constitutional: Positive for appetite change (decreased) and fatigue. Negative for activity change, chills, fever and unexpected weight change.  HENT: Negative for congestion, ear pain, hearing loss, sinus pressure, sinus pain, sore throat and trouble swallowing.   Eyes: Negative for pain and visual disturbance.  Respiratory: Negative for cough, chest tightness, shortness of breath and wheezing.   Cardiovascular: Negative for chest pain, palpitations and leg swelling.  Gastrointestinal: Negative for abdominal pain, blood in stool, constipation, diarrhea, nausea and vomiting.  Genitourinary: Negative for difficulty urinating and menstrual problem.  Musculoskeletal: Negative for arthralgias and back pain.  Skin: Negative for rash.  Neurological: Negative for dizziness, weakness, numbness and headaches.  Hematological: Negative for adenopathy. Does not bruise/bleed easily.  Psychiatric/Behavioral: Negative for sleep disturbance and suicidal ideas. The patient is not nervous/anxious.     Objective:  BP 128/74 (BP Location: Left Arm, Patient Position: Sitting, Cuff Size: Large)   Pulse 73   Temp 97.8 F (36.6 C) (Oral)   Ht '5\' 7"'  (1.702 m)   Wt 184 lb (83.5 kg)   SpO2 99%   BMI 28.82 kg/m   Weight: 184 lb (83.5 kg)   BP Readings from Last 3 Encounters:  05/15/20 128/74  04/26/20 (!) 125/57  03/10/20 (!) 150/78   Wt Readings from Last 3 Encounters:  05/15/20 184 lb (83.5 kg)  04/26/20 185 lb 3 oz (84 kg)  03/10/20 182 lb 11.2 oz (82.9 kg)    Physical Exam Constitutional:      General: She is not in acute distress.    Appearance: She is well-developed.  Cardiovascular:     Rate and Rhythm: Normal rate and regular rhythm.     Heart sounds: Normal heart sounds. No murmur heard. No friction rub.  Pulmonary:     Effort: Pulmonary effort is normal. No respiratory distress.     Breath sounds: Normal breath sounds. No wheezing or  rales.  Musculoskeletal:     Right lower leg: 1+ Edema (ankle/foot) present.     Left lower leg: 1+ Edema (ankle/foot) present.  Neurological:     Mental Status: She is alert and oriented to person, place, and time.  Psychiatric:        Behavior: Behavior normal.     Assessment/Plan   1. Blood loss anemia Hemoglobin is stable - bloodwork was obtained 2 days ago; but as we discussed, she may be low on iron. I will see if we can get a ferritin level added onto blood work, but have instructed her to work on higher iron diet in the meanwhile.  Handout was given to her today for this.  2. Primary hypertension Blood pressures been well controlled.  Continue with metoprolol 25 mg twice daily, Lasix 20 mg daily.  3. Chronic diastolic CHF (congestive heart failure) (HCC) Mild disease, noted on recent echo.  She continues with Lasix regularly.  We discussed elevation of legs and use of compression stockings to help manage lower extremity edema.  We also discussed that regular walking will help mobilize fluid.  We discussed importance of good nutrition intake to help maintain normal protein levels which will also help.  4. Other specified hypothyroidism Continue Euthyrox 25 mcg daily.  5. Type 2 diabetes mellitus with diabetic polyneuropathy, without long-term current use of insulin (HCC) She is checking blood sugar regularly.  I encouraged her to continue to check daily since she is not feeling well currently.  We will check in on Monday to see how her sugars are running through the weekend and how she feels overall.  Continue with metformin 500 mg daily.  6. Anticoagulated on warfarin Encouraged daughter to look into coverage for home INR monitoring. This would certainly be more convenient for them and I would be happy to help if this would be a covered service for her.    Return for pending additional labs.    Micheline Rough, MD

## 2020-05-15 NOTE — Telephone Encounter (Signed)
Per [facility] would like to know if the Dr could extend home health nursing orders to once a week for five more weeks. Starting May 23. The nurse can take a verbal order  cll back number is  [phone]

## 2020-05-15 NOTE — Telephone Encounter (Signed)
Yes that's fine 

## 2020-05-18 ENCOUNTER — Telehealth: Payer: Self-pay | Admitting: *Deleted

## 2020-05-18 NOTE — Telephone Encounter (Signed)
-----   Message from Wynn Banker, MD sent at 05/16/2020  3:10 PM EDT ----- Please see if lab can add on ferritin to bloodwork? May need to check with home nurse for this? #see how she is feeling? She was fatigued at visit. Make sure feeling ok; eating ok, bp ok over weekend and blood sugar doing ok.

## 2020-05-18 NOTE — Telephone Encounter (Signed)
Spoke with Amy and informed her of the approval for orders as below.  

## 2020-05-18 NOTE — Telephone Encounter (Signed)
Spoke with Hannah [name] at [facility] regarding the ferritin test.  She was given the account number for [facility] (per [id number]) as the labs were previously drawn by the home health nurse.  Hannah [name] stated the lab will pull the specimen to see if the test can be added, if so an authorization form will be sent to be completed and if not, a note will be sent with this info.  Spoke with the pt and she stated she is feeling OK, decreased swelling in the leg since last visit, she is eating OK, BP and glucose levels are good.  Message sent to PCP.

## 2020-05-18 NOTE — Telephone Encounter (Signed)
Spoke with the pts daughter [name] and informed her the forms were left at the front desk for pick up as there was no answer twice at the fax number of [phone].  Copy sent to be scanned.

## 2020-05-20 ENCOUNTER — Ambulatory Visit (INDEPENDENT_AMBULATORY_CARE_PROVIDER_SITE_OTHER): Payer: Medicare Other | Admitting: *Deleted

## 2020-05-20 DIAGNOSIS — E039 Hypothyroidism, unspecified: Secondary | ICD-10-CM | POA: Diagnosis not present

## 2020-05-20 DIAGNOSIS — M1990 Unspecified osteoarthritis, unspecified site: Secondary | ICD-10-CM | POA: Diagnosis not present

## 2020-05-20 DIAGNOSIS — E785 Hyperlipidemia, unspecified: Secondary | ICD-10-CM | POA: Diagnosis not present

## 2020-05-20 DIAGNOSIS — N1832 Chronic kidney disease, stage 3b: Secondary | ICD-10-CM | POA: Diagnosis not present

## 2020-05-20 DIAGNOSIS — Z5181 Encounter for therapeutic drug level monitoring: Secondary | ICD-10-CM | POA: Diagnosis not present

## 2020-05-20 DIAGNOSIS — Z9181 History of falling: Secondary | ICD-10-CM | POA: Diagnosis not present

## 2020-05-20 DIAGNOSIS — I5042 Chronic combined systolic (congestive) and diastolic (congestive) heart failure: Secondary | ICD-10-CM | POA: Diagnosis not present

## 2020-05-20 DIAGNOSIS — E1142 Type 2 diabetes mellitus with diabetic polyneuropathy: Secondary | ICD-10-CM | POA: Diagnosis not present

## 2020-05-20 DIAGNOSIS — Z87891 Personal history of nicotine dependence: Secondary | ICD-10-CM | POA: Diagnosis not present

## 2020-05-20 DIAGNOSIS — K219 Gastro-esophageal reflux disease without esophagitis: Secondary | ICD-10-CM | POA: Diagnosis not present

## 2020-05-20 DIAGNOSIS — I130 Hypertensive heart and chronic kidney disease with heart failure and stage 1 through stage 4 chronic kidney disease, or unspecified chronic kidney disease: Secondary | ICD-10-CM | POA: Diagnosis not present

## 2020-05-20 DIAGNOSIS — K922 Gastrointestinal hemorrhage, unspecified: Secondary | ICD-10-CM | POA: Diagnosis not present

## 2020-05-20 DIAGNOSIS — E1122 Type 2 diabetes mellitus with diabetic chronic kidney disease: Secondary | ICD-10-CM | POA: Diagnosis not present

## 2020-05-20 DIAGNOSIS — K5790 Diverticulosis of intestine, part unspecified, without perforation or abscess without bleeding: Secondary | ICD-10-CM | POA: Diagnosis not present

## 2020-05-20 LAB — POCT INR: INR: 2.2 (ref 2.0–3.0)

## 2020-05-20 NOTE — Patient Instructions (Addendum)
Description   Spoke with Amy LPN [facility] while in the home to take 1 tablet today then continue on same dosage 1/2 tablet daily except for 1 tablet on Mondays and Fridays. Recheck INR in 1 week by Home Health.

## 2020-05-21 NOTE — Telephone Encounter (Signed)
Ferritin level received and in normal range. Will follow up with patient as scheduled.

## 2020-05-24 ENCOUNTER — Other Ambulatory Visit: Payer: Self-pay | Admitting: Family Medicine

## 2020-05-25 ENCOUNTER — Other Ambulatory Visit: Payer: Self-pay | Admitting: *Deleted

## 2020-05-25 ENCOUNTER — Other Ambulatory Visit: Payer: Self-pay | Admitting: Family Medicine

## 2020-05-25 MED ORDER — METOPROLOL TARTRATE 25 MG PO TABS: 25.0000 mg | ORAL_TABLET | Freq: Two times a day (BID) | ORAL | 0 refills | Status: DC

## 2020-05-25 NOTE — Telephone Encounter (Signed)
Dr Hassan Rowan received the results from [facility], wanted to know if the patient is continuing to feel better, stated the iron stores look pretty good and recommended the pt work on a high iron diet to help build back strength and have protein with each meal-adding Boost or Ensure if not as hungry.  Patient informed of the results and stated she is feeling better.  Results sent to be scanned.

## 2020-05-27 ENCOUNTER — Ambulatory Visit (INDEPENDENT_AMBULATORY_CARE_PROVIDER_SITE_OTHER): Payer: Medicare Other | Admitting: Pharmacist

## 2020-05-27 ENCOUNTER — Other Ambulatory Visit: Payer: Self-pay | Admitting: Family Medicine

## 2020-05-27 DIAGNOSIS — Z87891 Personal history of nicotine dependence: Secondary | ICD-10-CM | POA: Diagnosis not present

## 2020-05-27 DIAGNOSIS — I5042 Chronic combined systolic (congestive) and diastolic (congestive) heart failure: Secondary | ICD-10-CM | POA: Diagnosis not present

## 2020-05-27 DIAGNOSIS — E785 Hyperlipidemia, unspecified: Secondary | ICD-10-CM | POA: Diagnosis not present

## 2020-05-27 DIAGNOSIS — M1990 Unspecified osteoarthritis, unspecified site: Secondary | ICD-10-CM | POA: Diagnosis not present

## 2020-05-27 DIAGNOSIS — E1122 Type 2 diabetes mellitus with diabetic chronic kidney disease: Secondary | ICD-10-CM | POA: Diagnosis not present

## 2020-05-27 DIAGNOSIS — Z5181 Encounter for therapeutic drug level monitoring: Secondary | ICD-10-CM

## 2020-05-27 DIAGNOSIS — E1142 Type 2 diabetes mellitus with diabetic polyneuropathy: Secondary | ICD-10-CM | POA: Diagnosis not present

## 2020-05-27 DIAGNOSIS — Z9181 History of falling: Secondary | ICD-10-CM | POA: Diagnosis not present

## 2020-05-27 DIAGNOSIS — N1832 Chronic kidney disease, stage 3b: Secondary | ICD-10-CM | POA: Diagnosis not present

## 2020-05-27 DIAGNOSIS — I130 Hypertensive heart and chronic kidney disease with heart failure and stage 1 through stage 4 chronic kidney disease, or unspecified chronic kidney disease: Secondary | ICD-10-CM | POA: Diagnosis not present

## 2020-05-27 DIAGNOSIS — E039 Hypothyroidism, unspecified: Secondary | ICD-10-CM | POA: Diagnosis not present

## 2020-05-27 DIAGNOSIS — K5790 Diverticulosis of intestine, part unspecified, without perforation or abscess without bleeding: Secondary | ICD-10-CM | POA: Diagnosis not present

## 2020-05-27 DIAGNOSIS — K219 Gastro-esophageal reflux disease without esophagitis: Secondary | ICD-10-CM | POA: Diagnosis not present

## 2020-05-27 DIAGNOSIS — K922 Gastrointestinal hemorrhage, unspecified: Secondary | ICD-10-CM | POA: Diagnosis not present

## 2020-05-27 LAB — POCT INR: INR: 2 (ref 2.0–3.0)

## 2020-06-02 DIAGNOSIS — Z933 Colostomy status: Secondary | ICD-10-CM | POA: Diagnosis not present

## 2020-06-02 DIAGNOSIS — Z4801 Encounter for change or removal of surgical wound dressing: Secondary | ICD-10-CM | POA: Diagnosis not present

## 2020-06-02 DIAGNOSIS — E119 Type 2 diabetes mellitus without complications: Secondary | ICD-10-CM | POA: Diagnosis not present

## 2020-06-02 DIAGNOSIS — K56609 Unspecified intestinal obstruction, unspecified as to partial versus complete obstruction: Secondary | ICD-10-CM | POA: Diagnosis not present

## 2020-06-03 ENCOUNTER — Encounter: Payer: Self-pay | Admitting: Pharmacist

## 2020-06-03 ENCOUNTER — Ambulatory Visit (INDEPENDENT_AMBULATORY_CARE_PROVIDER_SITE_OTHER): Payer: Medicare Other | Admitting: Pharmacist

## 2020-06-03 DIAGNOSIS — Z87891 Personal history of nicotine dependence: Secondary | ICD-10-CM | POA: Diagnosis not present

## 2020-06-03 DIAGNOSIS — M1990 Unspecified osteoarthritis, unspecified site: Secondary | ICD-10-CM | POA: Diagnosis not present

## 2020-06-03 DIAGNOSIS — E1142 Type 2 diabetes mellitus with diabetic polyneuropathy: Secondary | ICD-10-CM | POA: Diagnosis not present

## 2020-06-03 DIAGNOSIS — I130 Hypertensive heart and chronic kidney disease with heart failure and stage 1 through stage 4 chronic kidney disease, or unspecified chronic kidney disease: Secondary | ICD-10-CM | POA: Diagnosis not present

## 2020-06-03 DIAGNOSIS — Z86711 Personal history of pulmonary embolism: Secondary | ICD-10-CM | POA: Diagnosis not present

## 2020-06-03 DIAGNOSIS — E039 Hypothyroidism, unspecified: Secondary | ICD-10-CM | POA: Diagnosis not present

## 2020-06-03 DIAGNOSIS — Z9181 History of falling: Secondary | ICD-10-CM | POA: Diagnosis not present

## 2020-06-03 DIAGNOSIS — K5790 Diverticulosis of intestine, part unspecified, without perforation or abscess without bleeding: Secondary | ICD-10-CM | POA: Diagnosis not present

## 2020-06-03 DIAGNOSIS — K922 Gastrointestinal hemorrhage, unspecified: Secondary | ICD-10-CM | POA: Diagnosis not present

## 2020-06-03 DIAGNOSIS — Z5181 Encounter for therapeutic drug level monitoring: Secondary | ICD-10-CM | POA: Diagnosis not present

## 2020-06-03 DIAGNOSIS — E1122 Type 2 diabetes mellitus with diabetic chronic kidney disease: Secondary | ICD-10-CM | POA: Diagnosis not present

## 2020-06-03 DIAGNOSIS — Z7901 Long term (current) use of anticoagulants: Secondary | ICD-10-CM

## 2020-06-03 DIAGNOSIS — E785 Hyperlipidemia, unspecified: Secondary | ICD-10-CM | POA: Diagnosis not present

## 2020-06-03 DIAGNOSIS — N1832 Chronic kidney disease, stage 3b: Secondary | ICD-10-CM | POA: Diagnosis not present

## 2020-06-03 DIAGNOSIS — I5042 Chronic combined systolic (congestive) and diastolic (congestive) heart failure: Secondary | ICD-10-CM | POA: Diagnosis not present

## 2020-06-03 DIAGNOSIS — K219 Gastro-esophageal reflux disease without esophagitis: Secondary | ICD-10-CM | POA: Diagnosis not present

## 2020-06-03 LAB — POCT INR: INR: 3 (ref 2.0–3.0)

## 2020-06-03 NOTE — Telephone Encounter (Signed)
This encounter was created in error - please disregard.

## 2020-06-03 NOTE — Patient Instructions (Addendum)
Description   Spoke with Amy LPN [facility] while in the home. Continue 1 tablet on Mondays, Wed and Fridays and 1/2 tablet all other days of the week. Recheck INR in 1 week by Home Health.

## 2020-06-06 DIAGNOSIS — Z4801 Encounter for change or removal of surgical wound dressing: Secondary | ICD-10-CM | POA: Diagnosis not present

## 2020-06-06 DIAGNOSIS — Z933 Colostomy status: Secondary | ICD-10-CM | POA: Diagnosis not present

## 2020-06-06 DIAGNOSIS — K56609 Unspecified intestinal obstruction, unspecified as to partial versus complete obstruction: Secondary | ICD-10-CM | POA: Diagnosis not present

## 2020-06-06 DIAGNOSIS — E119 Type 2 diabetes mellitus without complications: Secondary | ICD-10-CM | POA: Diagnosis not present

## 2020-06-10 ENCOUNTER — Ambulatory Visit (INDEPENDENT_AMBULATORY_CARE_PROVIDER_SITE_OTHER): Payer: Medicare Other

## 2020-06-10 DIAGNOSIS — E1142 Type 2 diabetes mellitus with diabetic polyneuropathy: Secondary | ICD-10-CM | POA: Diagnosis not present

## 2020-06-10 DIAGNOSIS — Z87891 Personal history of nicotine dependence: Secondary | ICD-10-CM | POA: Diagnosis not present

## 2020-06-10 DIAGNOSIS — Z5181 Encounter for therapeutic drug level monitoring: Secondary | ICD-10-CM

## 2020-06-10 DIAGNOSIS — I130 Hypertensive heart and chronic kidney disease with heart failure and stage 1 through stage 4 chronic kidney disease, or unspecified chronic kidney disease: Secondary | ICD-10-CM | POA: Diagnosis not present

## 2020-06-10 DIAGNOSIS — Z86711 Personal history of pulmonary embolism: Secondary | ICD-10-CM

## 2020-06-10 DIAGNOSIS — N1832 Chronic kidney disease, stage 3b: Secondary | ICD-10-CM | POA: Diagnosis not present

## 2020-06-10 DIAGNOSIS — E039 Hypothyroidism, unspecified: Secondary | ICD-10-CM | POA: Diagnosis not present

## 2020-06-10 DIAGNOSIS — Z9181 History of falling: Secondary | ICD-10-CM | POA: Diagnosis not present

## 2020-06-10 DIAGNOSIS — K219 Gastro-esophageal reflux disease without esophagitis: Secondary | ICD-10-CM | POA: Diagnosis not present

## 2020-06-10 DIAGNOSIS — E1122 Type 2 diabetes mellitus with diabetic chronic kidney disease: Secondary | ICD-10-CM | POA: Diagnosis not present

## 2020-06-10 DIAGNOSIS — M1990 Unspecified osteoarthritis, unspecified site: Secondary | ICD-10-CM | POA: Diagnosis not present

## 2020-06-10 DIAGNOSIS — Z7901 Long term (current) use of anticoagulants: Secondary | ICD-10-CM

## 2020-06-10 DIAGNOSIS — K922 Gastrointestinal hemorrhage, unspecified: Secondary | ICD-10-CM | POA: Diagnosis not present

## 2020-06-10 DIAGNOSIS — I5042 Chronic combined systolic (congestive) and diastolic (congestive) heart failure: Secondary | ICD-10-CM | POA: Diagnosis not present

## 2020-06-10 DIAGNOSIS — E785 Hyperlipidemia, unspecified: Secondary | ICD-10-CM | POA: Diagnosis not present

## 2020-06-10 DIAGNOSIS — K5790 Diverticulosis of intestine, part unspecified, without perforation or abscess without bleeding: Secondary | ICD-10-CM | POA: Diagnosis not present

## 2020-06-10 LAB — POCT INR: INR: 3.3 — AB (ref 2.0–3.0)

## 2020-06-10 NOTE — Patient Instructions (Signed)
Description   Spoke with Amy LPN [facility] while in the home, advised to have pt continue on same dosage 1/2 tablet daily except 1 tablet on Mondays, Wednesdays and Fridays. Recheck INR in 1 week by Home Health.

## 2020-06-15 ENCOUNTER — Telehealth: Payer: Self-pay | Admitting: Pharmacist

## 2020-06-15 NOTE — Chronic Care Management (AMB) (Signed)
Chronic Care Management Pharmacy Assistant   Name: Hannah [name]  MRN: [id number] DOB: 1940-03-09  Reason for Encounter: Chart Prep for CPP visit on 06/18/2020   Conditions to be addressed/monitored: HTN, HLD, DMII, and Hypothyroid  Primary concerns for visit include: Hypertension   Recent office visits:  05.13.2022 [name], MD (PCP) patient seen for hospitalization follow-up 03.08.2022 [name], LPN Medicare Wellness Exam   Recent consult visits:  None  Hospital visits:  Medication Reconciliation was completed by comparing discharge summary, patient's EMR and Pharmacy list, and upon discussion with patient.  Admitted to the hospital on 04/22/2020 due to Acute Gl bleeding. Discharge date was 04/26/2020. Discharged from [place]?Medications Started at [facility] Discharge:?? -started the following medication due to GI bleed Pantoprazole (PROTONIX) 40 mg one tablet daily  Medication Changes at Hospital Discharge: -Changed  None  Medications Discontinued at Hospital Discharge: -Stopped Amlodipine 2.5 MG tablet (NORVASC) Lisinopril 40 MG tablet (ZESTRIL)  Medications that remain the same after Hospital Discharge:??  -All other medications will remain the same.    Medications: Outpatient Encounter Medications as of 06/15/2020  Medication Sig   acetaminophen (TYLENOL) 500 MG tablet Take 500 mg by mouth every 6 (six) hours as needed for mild pain.    Blood Glucose Monitoring Suppl (ONETOUCH VERIO) w/Device KIT 1 each by Does not apply route as directed.   diclofenac Sodium (VOLTAREN) 1 % GEL Apply 4 g topically 4 (four) times daily. (Patient taking differently: Apply 4 g topically daily as needed.)   EUTHYROX 25 MCG tablet TAKE 1 TABLET BY MOUTH ONCE DAILY BEFORE BREAKFAST   furosemide (LASIX) 20 MG tablet Take 1 tablet (20 mg total) by mouth daily as needed for fluid or edema.   glucose blood (ONETOUCH VERIO) test strip USE  AS DIRECTED ONCE  A  DAY   metFORMIN (GLUCOPHAGE-XR) 500 MG 24 hr tablet Take 1 tablet by mouth once daily with breakfast   metoprolol tartrate (LOPRESSOR) 25 MG tablet Take 1 tablet (25 mg total) by mouth 2 (two) times daily.   OneTouch Delica Lancets 67T MISC USE 1  TO CHECK GLUCOSE ONCE DAILY   pantoprazole (PROTONIX) 40 MG tablet Take 1 tablet (40 mg total) by mouth daily.   simvastatin (ZOCOR) 20 MG tablet Take 1 tablet (20 mg total) by mouth at bedtime.   vitamin B-12 (CYANOCOBALAMIN) 1000 MCG tablet Take 1 tablet (1,000 mcg total) by mouth daily.   vitamin C (ASCORBIC ACID) 500 MG tablet Take 500 mg by mouth daily.    warfarin (COUMADIN) 5 MG tablet TAKE 1/2 TO 1 (ONE-HALF TO ONE) TABLET BY MOUTH DAILY AS DIRECTED BY  COUMADIN  CLINIC (Patient taking differently: Take 2.5-5 mg by mouth See admin instructions. Take 5 mg tablet by mouth on Mondays and Fridays, then take 2.5 mg tablet by mouth all other days per patient)   No facility-administered encounter medications on file as of 06/15/2020.   Patient Questions:   Have you seen any other providers since your last visit? No Any changes in your medications or health? No Any side effects from any medications? No Do you have an symptoms or problems not managed by your medications? No Any concerns about your health right now? No Has your provider asked that you check blood pressure, blood sugar, or follow special diet at home?  Blood sugar daily and blood pressure Do you get any type of exercise on a regular basis?  She  gets some walking about a week Can you think of a goal you would like to reach for your health?  Maintain health Do you have any problems getting your medications? No Is there anything that you would like to discuss during the appointment? No  The patient was asked to please bring medications, blood pressure/ blood sugar log, and supplements to her appointment.  Date- Patient called to remind of appointment with Watt Climes  on 06/18/2020 at 2:00 pm  Patient aware of appointment date, time, and type of appointment ( telephone). Patient aware to have/bring all medications, supplements, blood pressure and/or blood sugar logs to visit.   Star Rating Drugs: Medication Dispensed  Quantity Pharmacy  Simvastatin 20 mg 05.21.2022 [facility]  Metformin 500 mg 04.05.2022 [facility]    Amilia (Revonda Standard, [place] Pharmacist Assistant [phone]

## 2020-06-16 ENCOUNTER — Ambulatory Visit (INDEPENDENT_AMBULATORY_CARE_PROVIDER_SITE_OTHER): Payer: Medicare Other

## 2020-06-16 DIAGNOSIS — I130 Hypertensive heart and chronic kidney disease with heart failure and stage 1 through stage 4 chronic kidney disease, or unspecified chronic kidney disease: Secondary | ICD-10-CM | POA: Diagnosis not present

## 2020-06-16 DIAGNOSIS — I5042 Chronic combined systolic (congestive) and diastolic (congestive) heart failure: Secondary | ICD-10-CM | POA: Diagnosis not present

## 2020-06-16 DIAGNOSIS — Z7901 Long term (current) use of anticoagulants: Secondary | ICD-10-CM

## 2020-06-16 DIAGNOSIS — K5790 Diverticulosis of intestine, part unspecified, without perforation or abscess without bleeding: Secondary | ICD-10-CM | POA: Diagnosis not present

## 2020-06-16 DIAGNOSIS — K922 Gastrointestinal hemorrhage, unspecified: Secondary | ICD-10-CM | POA: Diagnosis not present

## 2020-06-16 DIAGNOSIS — K219 Gastro-esophageal reflux disease without esophagitis: Secondary | ICD-10-CM | POA: Diagnosis not present

## 2020-06-16 DIAGNOSIS — Z5181 Encounter for therapeutic drug level monitoring: Secondary | ICD-10-CM | POA: Diagnosis not present

## 2020-06-16 DIAGNOSIS — E1122 Type 2 diabetes mellitus with diabetic chronic kidney disease: Secondary | ICD-10-CM | POA: Diagnosis not present

## 2020-06-16 DIAGNOSIS — M1990 Unspecified osteoarthritis, unspecified site: Secondary | ICD-10-CM | POA: Diagnosis not present

## 2020-06-16 DIAGNOSIS — N1832 Chronic kidney disease, stage 3b: Secondary | ICD-10-CM | POA: Diagnosis not present

## 2020-06-16 DIAGNOSIS — Z9181 History of falling: Secondary | ICD-10-CM | POA: Diagnosis not present

## 2020-06-16 DIAGNOSIS — E039 Hypothyroidism, unspecified: Secondary | ICD-10-CM | POA: Diagnosis not present

## 2020-06-16 DIAGNOSIS — Z87891 Personal history of nicotine dependence: Secondary | ICD-10-CM | POA: Diagnosis not present

## 2020-06-16 DIAGNOSIS — E785 Hyperlipidemia, unspecified: Secondary | ICD-10-CM | POA: Diagnosis not present

## 2020-06-16 DIAGNOSIS — E1142 Type 2 diabetes mellitus with diabetic polyneuropathy: Secondary | ICD-10-CM | POA: Diagnosis not present

## 2020-06-16 LAB — POCT INR: INR: 3.2 — AB (ref 2.0–3.0)

## 2020-06-16 NOTE — Patient Instructions (Signed)
Description   Spoke with Amy LPN [facility] while in the home, advised to have pt continue on same dosage 1/2 tablet daily except 1 tablet on Mondays, Wednesdays and Fridays. Recheck INR in 2 weeks by Home Health. Pt's insurance may require pt to be discharged next week, Amie will check INR at discharge next week and call if has to discharge.

## 2020-06-18 ENCOUNTER — Ambulatory Visit (INDEPENDENT_AMBULATORY_CARE_PROVIDER_SITE_OTHER): Payer: Medicare Other | Admitting: Pharmacist

## 2020-06-18 DIAGNOSIS — E1142 Type 2 diabetes mellitus with diabetic polyneuropathy: Secondary | ICD-10-CM | POA: Diagnosis not present

## 2020-06-18 DIAGNOSIS — I10 Essential (primary) hypertension: Secondary | ICD-10-CM

## 2020-06-18 NOTE — Progress Notes (Signed)
Chronic Care Management Pharmacy Note  07/02/2020 Name:  Hannah [name] MRN:  [id number] DOB:  April 13, 1940  Summary: BP not ideally at goal < 140/90 per home readings LDL not at goal < 70 A1c at goal < 7%  Recommendations/Changes made from today's visit: -Recommend increasing to high intensity statin based on LDL > 70 and ASCVD risk > 20% -Recommended continued BP and BG home monitoring  Plan: BP assessment in 1 month Follow up in 3 months   Subjective: Hannah [name] is an 80 y.o. year old female who is a primary patient of Koberlein, Steele Berg, MD.  The CCM team was consulted for assistance with disease management and care coordination needs.    Engaged with patient by telephone for initial visit in response to provider referral for pharmacy case management and/or care coordination services.   Consent to Services:  The patient was given the following information about Chronic Care Management services today, agreed to services, and gave verbal consent: 1. CCM service includes personalized support from designated clinical staff supervised by the primary care provider, including individualized plan of care and coordination with other care providers 2. 24/7 contact phone numbers for assistance for urgent and routine care needs. 3. Service will only be billed when office clinical staff spend 20 minutes or more in a month to coordinate care. 4. Only one practitioner may furnish and bill the service in a calendar month. 5.The patient may stop CCM services at any time (effective at the end of the month) by phone call to the office staff. 6. The patient will be responsible for cost sharing (co-pay) of up to 20% of the service fee (after annual deductible is met). Patient agreed to services and consent obtained.  Patient Care Team: [name], MD as PCP - General (Family Medicine) Fay Records, MD as PCP - Cardiology (Cardiology) [name], [facility] as Pharmacist (Pharmacist)  Recent  office visits: 05/15/20 [name], MD (PCP): patient seen for hospitalization follow-up.  03/10/20 [name], LPN: Presented for Medicare Wellness Exam.  Recent consult visits: 06/16/20 Ovidio Kin, RN (cardiology): Patient presented for anticoag visit. INR 3.2, goal 2.5-3.5. Continued 5 mg (5 mg x 1) every Mon, Wed, Fri; 2.5 mg (5 mg x 0.5) all other days.  Hospital visits: Medication Reconciliation was completed by comparing discharge summary, patient's EMR and Pharmacy list, and upon discussion with patient.   Admitted to the hospital on 04/22/2020 due to Acute Gl bleeding. Discharge date was 04/26/2020. Discharged from [place]?Medications Started at [facility] Discharge:?? -started the following medication due to GI bleed Pantoprazole (PROTONIX) 40 mg one tablet daily   Medication Changes at Hospital Discharge: -Changed  None   Medications Discontinued at Hospital Discharge: -Stopped Amlodipine 2.5 MG tablet (NORVASC) Lisinopril 40 MG tablet (ZESTRIL)   Medications that remain the same after Hospital Discharge:?? -All other medications will remain the same.     Objective:  Lab Results  Component Value Date   CREATININE 1.32 (H) 04/25/2020   BUN 16 04/25/2020   GFR 51.59 (L) 04/24/2019   GFRNONAA 41 (L) 04/25/2020   GFRAA 45 (L) 12/03/2019   NA 138 04/25/2020   K 3.8 04/25/2020   CALCIUM 8.2 (L) 04/25/2020   CO2 21 (L) 04/25/2020   GLUCOSE 84 04/25/2020    Lab Results  Component Value Date/Time   HGBA1C 6.4 (H) 04/23/2020 04:40 AM   HGBA1C 6.5 (H) 10/30/2019 11:09 AM  GFR 51.59 (L) 04/24/2019 01:49 PM   GFR 67.29 11/28/2018 12:20 PM   MICROALBUR 4.3 (H) 09/12/2018 03:07 PM   MICROALBUR 6.2 (H) 04/10/2015 09:01 AM    Last diabetic Eye exam:  Lab Results  Component Value Date/Time   HMDIABEYEEXA No Retinopathy 06/23/2015 12:00 AM    Last diabetic Foot exam: No results found for: HMDIABFOOTEX   Lab Results   Component Value Date   CHOL 180 10/30/2019   HDL 65 10/30/2019   LDLCALC 95 10/30/2019   TRIG 108 10/30/2019   CHOLHDL 2.8 10/30/2019    Hepatic Function Latest Ref Rng & Units 04/22/2020 10/30/2019 04/24/2019  Total Protein 6.5 - 8.1 g/dL 6.4(L) 7.5 6.6  Albumin 3.5 - 5.0 g/dL 3.4(L) - 3.8  AST 15 - 41 U/L _0 ALT 0 - 44 U/L _1 Alk Phosphatase 38 - 126 U/L 57 - 66  Total Bilirubin 0.3 - 1.2 mg/dL 0.6 0.5 0.5    Lab Results  Component Value Date/Time   TSH 1.48 10/30/2019 11:09 AM   TSH 1.27 04/24/2019 01:49 PM   FREET4 1.09 02/25/2013 12:23 PM    CBC Latest Ref Rng & Units 04/26/2020 04/26/2020 04/25/2020  WBC 4.0 - 10.5 K/uL - 9.3 11.2(H)  Hemoglobin 12.0 - 15.0 g/dL 7.7(L) 7.5(L) 8.3(L)  Hematocrit 36.0 - 46.0 % 24.6(L) 24.4(L) 26.4(L)  Platelets 150 - 400 K/uL - 148(L) 176    No results found for: VD25OH  Clinical ASCVD: No  The 10-year ASCVD risk score Mikey Bussing DC Jr., et al., 2013) is: 38.7%   Values used to calculate the score:     Age: 54 years     Sex: Female     Is Non-Hispanic African American: Yes     Diabetic: Yes     Tobacco smoker: No     Systolic Blood Pressure: 786 mmHg     Is BP treated: Yes     HDL Cholesterol: 65 mg/dL     Total Cholesterol: 180 mg/dL    Depression screen [facility] 2/9 03/10/2020 10/30/2019 09/13/2018  Decreased Interest 0 0 0  Down, Depressed, Hopeless 0 0 0  PHQ - 2 Score 0 0 0  Some recent data might be hidden    Social History   Tobacco Use  Smoking Status Former   Packs/day: 1.00   Years: 10.00   Pack years: 10.00   Types: Cigarettes   Quit date: 01/04/1968   Years since quitting: 52.5  Smokeless Tobacco Never   BP Readings from Last 3 Encounters:  05/15/20 128/74  04/26/20 (!) 125/57  03/10/20 (!) 150/78   Pulse Readings from Last 3 Encounters:  05/15/20 73  04/26/20 87  03/10/20 83   Wt Readings from Last 3 Encounters:  05/15/20 184 lb (83.5 kg)  04/26/20 185 lb 3 oz (84 kg)  03/10/20 182 lb 11.2 oz  (82.9 kg)   BMI Readings from Last 3 Encounters:  05/15/20 28.82 kg/m  04/26/20 29.00 kg/m  03/10/20 29.94 kg/m    Assessment/Interventions: Review of patient past medical history, allergies, medications, health status, including review of consultants reports, laboratory and other test data, was performed as part of comprehensive evaluation and provision of chronic care management services.   SDOH:  (Social Determinants of Health) assessments and interventions performed: Yes SDOH Interventions    Flowsheet Row Most Recent Value  SDOH Interventions   Financial Strain Interventions Intervention Not Indicated  Transportation Interventions Intervention Not Indicated  SDOH Screenings   Alcohol Screen: Not on file  Depression (PHQ2-9): Low Risk    PHQ-2 Score: 0  Financial Resource Strain: Low Risk    Difficulty of Paying Living Expenses: Not hard at all  Food Insecurity: No Food Insecurity   Worried About Charity fundraiser in the Last Year: Never true   Ran Out of Food in the Last Year: Never true  Housing: Low Risk    Last Housing Risk Score: 0  Physical Activity: Insufficiently Active   Days of Exercise per Week: 5 days   Minutes of Exercise per Session: 10 min  Social Connections: Moderately Integrated   Frequency of Communication with Friends and Family: More than three times a week   Frequency of Social Gatherings with Friends and Family: More than three times a week   Attends Religious Services: More than 4 times per year   Active Member of [employer] or Organizations: Yes   Attends Archivist Meetings: 1 to 4 times per year   Marital Status: Widowed  Stress: No Stress Concern Present   Feeling of Stress : Not at all  Tobacco Use: Medium Risk   Smoking Tobacco Use: Former   Smokeless Tobacco Use: Never  Transportation Needs: No Data processing manager (Medical): No   Lack of Transportation (Non-Medical): No   Patient gets up in  the morning and watches TV and she has an aid coming in on Mon, Wed, and Fri and she helps her with taking a shower and will fix her a bowl of cereal and may make her an egg.  Patient enjoys reading and writing and word search puzzles. Sometimes her daughter comes back after work. Her daughter Lattie Haw and her kids are moving up to [place] and are moving from [facility].  Patient has discharge paperwork to help her stay organized with when to take her medications. Her aid helps her go through her medications and uses a pillbox for her medications that is organized by the day and the week. Patient denies any problems with her medications that she is aware of.  CCM Care Plan  Allergies  Allergen Reactions   Augmentin [Amoxicillin-Pot Clavulanate] Itching and Other (See Comments)    Severe vaginal itching   Tranxene [Clorazepate] Itching   Penicillins Itching and Rash    Has patient had a PCN reaction causing immediate rash, facial/tongue/throat swelling, SOB or lightheadedness with hypotension: Yes Has patient had a PCN reaction causing severe rash involving mucus membranes or skin necrosis: No Has patient had a PCN reaction that required hospitalization: No Has patient had a PCN reaction occurring within the last 10 years: No If all of the above answers are "NO", then may proceed with Cephalosporin use.     Medications Reviewed Today     Reviewed by [name], [facility] (Pharmacist) on 07/02/20 at 2147  Med List Status: <None>   Medication Order Taking? Sig Documenting Provider Last Dose Status Informant  acetaminophen (TYLENOL) 500 MG tablet [id number] Yes Take 500 mg by mouth every 6 (six) hours as needed for mild pain.  [provider] Taking Active Self  Blood Glucose Monitoring Suppl (ONETOUCH VERIO) w/Device KIT [id number]  1 each by Does not apply route as directed. [name], MD  Active Self  diclofenac Sodium (VOLTAREN) 1 % GEL [id number] Yes Apply 4 g  topically 4 (four) times daily.  Patient taking differently: Apply 4 g topically daily as needed.   Jamse Arn  Jenetta Downer, MD Taking Active Self  EUTHYROX 25 MCG tablet [id number] Yes TAKE 1 TABLET BY MOUTH ONCE DAILY BEFORE BREAKFAST Koberlein, Junell C, MD Taking Active   furosemide (LASIX) 20 MG tablet [id number] Yes Take 1 tablet (20 mg total) by mouth daily as needed for fluid or edema. Lucretia Kern, DO Taking Active Self  glucose blood (ONETOUCH VERIO) test strip [id number]  USE AS DIRECTED ONCE  A  DAY Koberlein, Steele Berg, MD  Active Self  metFORMIN (GLUCOPHAGE-XR) 500 MG 24 hr tablet [id number] Yes Take 1 tablet by mouth once daily with breakfast Koberlein, Junell C, MD Taking Active Self  metoprolol tartrate (LOPRESSOR) 25 MG tablet [id number] Yes Take 1 tablet (25 mg total) by mouth 2 (two) times daily. [name], MD Taking Active   OneTouch Delica Lancets 46F [place] [id number]  USE 1  TO CHECK GLUCOSE ONCE DAILY Koberlein, Junell C, MD  Active Self  pantoprazole (PROTONIX) 40 MG tablet [id number] Yes Take 1 tablet (40 mg total) by mouth daily. Hosie Poisson, MD Taking Active   simvastatin (ZOCOR) 20 MG tablet [id number] Yes Take 1 tablet (20 mg total) by mouth at bedtime. [name], MD Taking Active Self  vitamin B-12 (CYANOCOBALAMIN) 1000 MCG tablet [id number] Yes Take 1 tablet (1,000 mcg total) by mouth daily. [name], MD Taking Active   vitamin C (ASCORBIC ACID) 500 MG tablet [id number] Yes Take 500 mg by mouth daily.  [provider] Taking Active Self  warfarin (COUMADIN) 5 MG tablet [id number] Yes TAKE 1/2 TO 1 (ONE-HALF TO ONE) TABLET BY MOUTH DAILY AS DIRECTED BY  COUMADIN  CLINIC  Patient taking differently: Take 2.5-5 mg by mouth See admin instructions. Take 5 mg tablet by mouth on Mondays and Fridays, then take 2.5 mg tablet by mouth all other days per patient   Fay Records, MD Taking Active Multiple Informants            Patient Active Problem  List   Diagnosis Date Noted   Thrombocytopenia ([place]) 04/23/2020   Acute GI bleeding 04/22/2020   Coronary artery calcification seen on CT scan 06/24/2019   Hypertension 04/25/2019   Chronic kidney disease, stage 3a ([place]) 12/15/2018   Syncope 10/30/2018   Frequent falls 05/21/2018   Encounter for therapeutic drug monitoring 05/07/2018   Large bowel stricture ([place]) 12/15/2017   Chronic diastolic CHF (congestive heart failure) ([place]) 03/15/2017   Anticoagulated on warfarin    Pulmonary hypertension ([place]) 07/26/2016   Neuropathy 02/12/2015   Osteoarthritis 08/12/2014   History of pulmonary embolism 06/10/2013   Hypothyroid 06/06/2013   Diabetes mellitus with neurological manifestations, controlled ([place]) 06/06/2013   Hyperlipidemia 02/25/2013   DM2 (diabetes mellitus, type 2) ([place]) 07/23/2012    Immunization History  Administered Date(s) Administered   Fluad Quad(high Dose 65+) 09/12/2018, 10/30/2019   Influenza Split 10/03/2012   Influenza Whole 10/04/2011   Influenza, High Dose Seasonal PF 11/12/2014, 10/21/2015, 10/20/2016, 09/19/2017   Influenza,inj,Quad PF,6+ Mos 11/07/2013   Influenza-Unspecified 10/03/2016   PFIZER(Purple Top)SARS-COV-2 Vaccination 02/26/2019, 03/09/2019, 12/20/2019   Pneumococcal Conjugate-13 04/07/2014, 10/20/2016   Pneumococcal-Unspecified 10/04/2011   Tdap 04/07/2014   Zoster, Live 07/23/2015    Conditions to be addressed/monitored:  Hypertension, Hyperlipidemia, Diabetes, Heart Failure, Chronic Kidney Disease, Hypothyroidism, Osteoarthritis, and GI bleed and neuropathy  Care Plan : [place]  Updates made by [name], [place] since 07/02/2020 12:00 AM     Problem: Problem: Hypertension, Hyperlipidemia, Diabetes, Heart Failure, Chronic Kidney Disease,  Hypothyroidism, Osteoarthritis, and GI bleed and neuropathy      Long-Range Goal: Patient-Specific Goal   Start Date: 06/18/2020  Expected End Date: 06/18/2021  This Visit's  Progress: On track  Priority: High  Note:   Current Barriers:  Unable to achieve control of cholesterol  Unable to maintain control of blood pressure  Pharmacist Clinical Goal(s):  Patient will achieve control of cholesterol as evidenced by next lipid panel maintain control of blood pressure as evidenced by home blood pressure readings  through collaboration with PharmD and provider.   Interventions: 1:1 collaboration with [name], MD regarding development and update of comprehensive plan of care as evidenced by provider attestation and co-signature Inter-disciplinary care team collaboration (see longitudinal plan of care) Comprehensive medication review performed; medication list updated in electronic medical record  Hypertension (BP goal <140/90) -Not ideally controlled -Current treatment: Metoprolol tartrate 25 mg 1 tablet twice daily -Medications previously tried: amlodipine, lisinopril  -Current home readings: 140/93 HR 76, 117/72 HR 58, 134/65 HR 73, 148/91 HR 72, 132/39 HR 52, 136/93 HR 76  -Current dietary habits: limits salt intake -Current exercise habits: PT exercises every day -Denies hypotensive/hypertensive symptoms -Educated on Daily salt intake goal < 2300 mg; Importance of home blood pressure monitoring; Proper BP monitoring technique; Symptoms of hypotension and importance of maintaining adequate hydration; -Counseled to monitor BP at home daily, document, and provide log at future appointments -Counseled on diet and exercise extensively Recommended to continue current medication  Hyperlipidemia: (LDL goal < 70) -Uncontrolled -Current treatment: Simvastatin 20 mg 1 tablet at bedtime -Medications previously tried: none  -Current dietary patterns: eats a lot of fish and only some red meat; tries to boil instead of frying and tries not to cook with oil but does use vegetable oil - recommended olive or canola -Current exercise habits: PT exercises  every day -Educated on Cholesterol goals;  Benefits of statin for ASCVD risk reduction; Importance of limiting foods high in cholesterol; -Counseled on diet and exercise extensively Recommended increasing to high intensity statin therapy  Diabetes (A1c goal <7%) -Controlled -Current medications: Metformin XR 500 mg 1 tablet once daily with breakfast -Medications previously tried: n/a  -Current home glucose readings fasting glucose: 112, 106, 102, 103, 117, 115 post prandial glucose: does not check -Denies hypoglycemic/hyperglycemic symptoms -Current meal patterns:  breakfast: did not discuss lunch: did not discuss  dinner: did not discuss snacks: did not discuss drinks: did not discuss -Current exercise: PT exercises every day -Educated on A1c and blood sugar goals; Prevention and management of hypoglycemic episodes; Benefits of routine self-monitoring of blood sugar; Carbohydrate counting and/or plate method -Counseled to check feet daily and get yearly eye exams -Counseled on diet and exercise extensively Recommended to continue current medication Recommended for patient to contact eye doctor to get report from last eye exam  Heart Failure (Goal: manage symptoms and prevent exacerbations) -Controlled -Last ejection fraction: 45-50% (Date: 04/23/20) -HF type: Diastolic -NYHA Class: II (slight limitation of activity) -AHA HF Stage: B (Heart disease present - no symptoms present) -Current treatment: Furosemide 20 mg daily as needed Metoprolol tartrate 25 mg 1 tablet twice daily -Medications previously tried: none  -Current home BP/HR readings: refer to above -Current dietary habits: limiting salt intake -Current exercise habits: PT exercises -Educated on Benefits of medications for managing symptoms and prolonging life Importance of weighing daily; if you gain more than 3 pounds in one day or 5 pounds in one week, call cardiologist Importance of blood pressure  control -Counseled on diet and exercise extensively Recommended to continue current medication Patient is only taking furosemide not even once a week  Hypothyroidism (Goal: TSH  0.35-4.5) -Controlled -Current treatment  Euthyrox 25 mcg 1 tablet before breakfast -Medications previously tried: none  -Recommended to continue current medication Counseled on importance of taking this medication on an empty stomach separate from all multivitamins and supplements.  Stomach protection (Goal: protect stomach after GI bleed) -Controlled -Current treatment  Pantoprazole 40 mg 1 tablet daily -Medications previously tried: none  - Plan to reassess need at follow up.  Osteoarthritis (Goal: minimize pain) -Controlled -Current treatment  Voltaren gel 1% apply as needed Aspercreme Tylenol 500 mg 1 tablet as needed -Medications previously tried: none  -Recommended to continue current medication  History of PE (Goal: prevent blood clots) -Controlled -Current treatment  Warfarin 5 mg as directed by coumadin clinic -Medications previously tried: none  -Recommended to continue current medication Counseled on maintaining consistent intake of greens from week to week   Health Maintenance -Vaccine gaps: shingrix, COVID booster -Current therapy:  Vitamin C 500 mg daily Vitamin B12 1000 mcg daily -Educated on Cost vs benefit of each product must be carefully weighed by individual consumer -Patient is satisfied with current therapy and denies issues -Recommended to continue current medication  Patient Goals/Self-Care Activities Patient will:  - take medications as prescribed check glucose daily, document, and provide at future appointments check blood pressure daily, document, and provide at future appointments weigh daily, and contact provider if weight gain of > 3 lbs in one day or > 5 lbs in one week  Follow Up Plan: Telephone follow up appointment with care management team member  scheduled for: 3 months     Medication Assistance: None required.  Patient affirms current coverage meets needs.  Compliance/Adherence/Medication fill history: Care Gaps: Shingrix, eye exam, urine microalbumin, COVID booster  Star-Rating Drugs: Simvastatin 20 mg - last filled 05/23/20 for 90 ds at [facility] Metformin 500 mg - last filled 04/07/20 for 90 ds at [facility]  Patient's preferred pharmacy is:  [place] ([place] - [address] [place] ([place] [address] Phone: [phone] Fax: [phone]  Uses pill box? Yes Pt endorses 99% compliance  We discussed: Benefits of medication synchronization, packaging and delivery as well as enhanced pharmacist oversight with [employer]. Patient decided to: Continue current medication management strategy  Care Plan and Follow Up Patient Decision:  Patient agrees to Care Plan and Follow-up.  Plan: Telephone follow up appointment with care management team member scheduled for:  3 months  Jeni Salles, PharmD, [place] Pharmacist [place] [facility] [place] [phone]

## 2020-06-24 ENCOUNTER — Ambulatory Visit (INDEPENDENT_AMBULATORY_CARE_PROVIDER_SITE_OTHER): Payer: Medicare Other | Admitting: *Deleted

## 2020-06-24 DIAGNOSIS — I5042 Chronic combined systolic (congestive) and diastolic (congestive) heart failure: Secondary | ICD-10-CM | POA: Diagnosis not present

## 2020-06-24 DIAGNOSIS — E1122 Type 2 diabetes mellitus with diabetic chronic kidney disease: Secondary | ICD-10-CM | POA: Diagnosis not present

## 2020-06-24 DIAGNOSIS — Z87891 Personal history of nicotine dependence: Secondary | ICD-10-CM | POA: Diagnosis not present

## 2020-06-24 DIAGNOSIS — E039 Hypothyroidism, unspecified: Secondary | ICD-10-CM | POA: Diagnosis not present

## 2020-06-24 DIAGNOSIS — E785 Hyperlipidemia, unspecified: Secondary | ICD-10-CM | POA: Diagnosis not present

## 2020-06-24 DIAGNOSIS — M1990 Unspecified osteoarthritis, unspecified site: Secondary | ICD-10-CM | POA: Diagnosis not present

## 2020-06-24 DIAGNOSIS — N1832 Chronic kidney disease, stage 3b: Secondary | ICD-10-CM | POA: Diagnosis not present

## 2020-06-24 DIAGNOSIS — K219 Gastro-esophageal reflux disease without esophagitis: Secondary | ICD-10-CM | POA: Diagnosis not present

## 2020-06-24 DIAGNOSIS — K922 Gastrointestinal hemorrhage, unspecified: Secondary | ICD-10-CM | POA: Diagnosis not present

## 2020-06-24 DIAGNOSIS — Z5181 Encounter for therapeutic drug level monitoring: Secondary | ICD-10-CM | POA: Diagnosis not present

## 2020-06-24 DIAGNOSIS — I130 Hypertensive heart and chronic kidney disease with heart failure and stage 1 through stage 4 chronic kidney disease, or unspecified chronic kidney disease: Secondary | ICD-10-CM | POA: Diagnosis not present

## 2020-06-24 DIAGNOSIS — K5790 Diverticulosis of intestine, part unspecified, without perforation or abscess without bleeding: Secondary | ICD-10-CM | POA: Diagnosis not present

## 2020-06-24 DIAGNOSIS — Z9181 History of falling: Secondary | ICD-10-CM | POA: Diagnosis not present

## 2020-06-24 DIAGNOSIS — E1142 Type 2 diabetes mellitus with diabetic polyneuropathy: Secondary | ICD-10-CM | POA: Diagnosis not present

## 2020-06-24 LAB — POCT INR: INR: 4.4 — AB (ref 2.0–3.0)

## 2020-06-24 NOTE — Patient Instructions (Addendum)
Description   Spoke with Amy LPN [facility] while in the home, advised to have pt hold today's dose then continue taking 1/2 tablet daily except 1 tablet on Mondays, Wednesdays and Fridays. Recheck INR in 1 week in the office (d/c from [facility]).

## 2020-06-30 ENCOUNTER — Ambulatory Visit: Payer: Medicare Other

## 2020-07-01 ENCOUNTER — Ambulatory Visit (INDEPENDENT_AMBULATORY_CARE_PROVIDER_SITE_OTHER): Payer: Medicare Other | Admitting: *Deleted

## 2020-07-01 ENCOUNTER — Other Ambulatory Visit: Payer: Self-pay

## 2020-07-01 DIAGNOSIS — Z7901 Long term (current) use of anticoagulants: Secondary | ICD-10-CM | POA: Diagnosis not present

## 2020-07-01 DIAGNOSIS — Z5181 Encounter for therapeutic drug level monitoring: Secondary | ICD-10-CM | POA: Diagnosis not present

## 2020-07-01 DIAGNOSIS — I2699 Other pulmonary embolism without acute cor pulmonale: Secondary | ICD-10-CM

## 2020-07-01 LAB — POCT INR: INR: 3.5 — AB (ref 2.0–3.0)

## 2020-07-01 NOTE — Patient Instructions (Signed)
Description   Continue taking 1/2 tablet daily except 1 tablet on Mondays, Wednesdays and Fridays. Today have some leafy veggies and remain consistent with two to three times a week. Recheck INR in 2 weeks. [facility]in [facility] [phone]

## 2020-07-02 NOTE — Patient Instructions (Signed)
Hi Hannah [name],  It was great to get to meet you over the telephone! Below is a summary of some of the topics we discussed.   Please reach out to me if you have any questions or need anything before our follow up!  Best, Maddie  Gaylord Shih, PharmD, [facility] Clinical Pharmacist [facility] [place] [phone]   Visit Information   Goals Addressed             This Visit's Progress    Manage My Medicine       Timeframe:  Short-Term Goal Priority:  High Start Date:                             Expected End Date:                       Follow Up Date 09/22/20    - call for medicine refill 2 or 3 days before it runs out - keep a list of all the medicines I take; vitamins and herbals too - use a pillbox to sort medicine    Why is this important?   These steps will help you keep on track with your medicines.   Notes:         Patient Care Plan: CCM Pharmacy Care Plan     Problem Identified: Problem: Hypertension, Hyperlipidemia, Diabetes, Heart Failure, Chronic Kidney Disease, Hypothyroidism, Osteoarthritis, and GI bleed and neuropathy      Long-Range Goal: Patient-Specific Goal   Start Date: 06/18/2020  Expected End Date: 06/18/2021  This Visit's Progress: On track  Priority: High  Note:   Current Barriers:  Unable to achieve control of cholesterol  Unable to maintain control of blood pressure  Pharmacist Clinical Goal(s):  Patient will achieve control of cholesterol as evidenced by next lipid panel maintain control of blood pressure as evidenced by home blood pressure readings  through collaboration with PharmD and provider.   Interventions: 1:1 collaboration with Wynn Banker, MD regarding development and update of comprehensive plan of care as evidenced by provider attestation and co-signature Inter-disciplinary care team collaboration (see longitudinal plan of care) Comprehensive medication review performed; medication list updated in  electronic medical record  Hypertension (BP goal <140/90) -Not ideally controlled -Current treatment: Metoprolol tartrate 25 mg 1 tablet twice daily -Medications previously tried: amlodipine, lisinopril  -Current home readings: 140/93 HR 76, 117/72 HR 58, 134/65 HR 73, 148/91 HR 72, 132/39 HR 52, 136/93 HR 76  -Current dietary habits: limits salt intake -Current exercise habits: PT exercises every day -Denies hypotensive/hypertensive symptoms -Educated on Daily salt intake goal < 2300 mg; Importance of home blood pressure monitoring; Proper BP monitoring technique; Symptoms of hypotension and importance of maintaining adequate hydration; -Counseled to monitor BP at home daily, document, and provide log at future appointments -Counseled on diet and exercise extensively Recommended to continue current medication  Hyperlipidemia: (LDL goal < 70) -Uncontrolled -Current treatment: Simvastatin 20 mg 1 tablet at bedtime -Medications previously tried: none  -Current dietary patterns: eats a lot of fish and only some red meat; tries to boil instead of frying and tries not to cook with oil but does use vegetable oil - recommended olive or canola -Current exercise habits: PT exercises every day -Educated on Cholesterol goals;  Benefits of statin for ASCVD risk reduction; Importance of limiting foods high in cholesterol; -Counseled on diet and exercise extensively Recommended increasing to high intensity statin therapy  Diabetes (A1c goal <7%) -Controlled -Current medications: Metformin XR 500 mg 1 tablet once daily with breakfast -Medications previously tried: n/a  -Current home glucose readings fasting glucose: 112, 106, 102, 103, 117, 115 post prandial glucose: does not check -Denies hypoglycemic/hyperglycemic symptoms -Current meal patterns:  breakfast: did not discuss lunch: did not discuss  dinner: did not discuss snacks: did not discuss drinks: did not discuss -Current  exercise: PT exercises every day -Educated on A1c and blood sugar goals; Prevention and management of hypoglycemic episodes; Benefits of routine self-monitoring of blood sugar; Carbohydrate counting and/or plate method -Counseled to check feet daily and get yearly eye exams -Counseled on diet and exercise extensively Recommended to continue current medication Recommended for patient to contact eye doctor to get report from last eye exam  Heart Failure (Goal: manage symptoms and prevent exacerbations) -Controlled -Last ejection fraction: 45-50% (Date: 04/23/20) -HF type: Diastolic -NYHA Class: II (slight limitation of activity) -AHA HF Stage: B (Heart disease present - no symptoms present) -Current treatment: Furosemide 20 mg daily as needed Metoprolol tartrate 25 mg 1 tablet twice daily -Medications previously tried: none  -Current home BP/HR readings: refer to above -Current dietary habits: limiting salt intake -Current exercise habits: PT exercises -Educated on Benefits of medications for managing symptoms and prolonging life Importance of weighing daily; if you gain more than 3 pounds in one day or 5 pounds in one week, call cardiologist Importance of blood pressure control -Counseled on diet and exercise extensively Recommended to continue current medication Patient is only taking furosemide not even once a week  Hypothyroidism (Goal: TSH  0.35-4.5) -Controlled -Current treatment  Euthyrox 25 mcg 1 tablet before breakfast -Medications previously tried: none  -Recommended to continue current medication Counseled on importance of taking this medication on an empty stomach separate from all multivitamins and supplements.  Stomach protection (Goal: protect stomach after GI bleed) -Controlled -Current treatment  Pantoprazole 40 mg 1 tablet daily -Medications previously tried: none  - Plan to reassess need at follow up.  Osteoarthritis (Goal: minimize  pain) -Controlled -Current treatment  Voltaren gel 1% apply as needed Aspercreme Tylenol 500 mg 1 tablet as needed -Medications previously tried: none  -Recommended to continue current medication  History of PE (Goal: prevent blood clots) -Controlled -Current treatment  Warfarin 5 mg as directed by coumadin clinic -Medications previously tried: none  -Recommended to continue current medication Counseled on maintaining consistent intake of greens from week to week   Health Maintenance -Vaccine gaps: shingrix, COVID booster -Current therapy:  Vitamin C 500 mg daily Vitamin B12 1000 mcg daily -Educated on Cost vs benefit of each product must be carefully weighed by individual consumer -Patient is satisfied with current therapy and denies issues -Recommended to continue current medication  Patient Goals/Self-Care Activities Patient will:  - take medications as prescribed check glucose daily, document, and provide at future appointments check blood pressure daily, document, and provide at future appointments weigh daily, and contact provider if weight gain of > 3 lbs in one day or > 5 lbs in one week  Follow Up Plan: Telephone follow up appointment with care management team member scheduled for: 3 months      Ms. Detter was given information about Chronic Care Management services today including:  CCM service includes personalized support from designated clinical staff supervised by her physician, including individualized plan of care and coordination with other care providers 24/7 contact phone numbers for assistance for urgent and routine care needs. Standard insurance, coinsurance, copays  and deductibles apply for chronic care management only during months in which we provide at least 20 minutes of these services. Most insurances cover these services at 100%, however patients may be responsible for any copay, coinsurance and/or deductible if applicable. This service may help  you avoid the need for more expensive face-to-face services. Only one practitioner may furnish and bill the service in a calendar month. The patient may stop CCM services at any time (effective at the end of the month) by phone call to the office staff.  Patient agreed to services and verbal consent obtained.   The patient verbalized understanding of instructions, educational materials, and care plan provided today and agreed to receive a mailed copy of patient instructions, educational materials, and care plan.  Telephone follow up appointment with pharmacy team member scheduled for: 3 months  Verner Chol, [facility]

## 2020-07-07 ENCOUNTER — Telehealth: Payer: Self-pay | Admitting: Pharmacist

## 2020-07-07 NOTE — Telephone Encounter (Signed)
Called patient to follow up on medication change from CCM visit after discussion with PCP. Left a voicemail. Plan to try calling again later.

## 2020-07-07 NOTE — Telephone Encounter (Signed)
Patient called back. Discussed recommendations to increase her cholesterol medication to target a lower LDL and based on ASCVD risk.   Patient is aware to discard the simvastatin once she starts taking the rosuvastatin 5 mg daily. Patient confirmed to send the prescription to her [place] pharmacy on [address]

## 2020-07-12 ENCOUNTER — Other Ambulatory Visit: Payer: Self-pay | Admitting: Family Medicine

## 2020-07-12 MED ORDER — ROSUVASTATIN CALCIUM 5 MG PO TABS: 5.0000 mg | ORAL_TABLET | Freq: Every day | ORAL | 3 refills | Status: DC

## 2020-07-14 ENCOUNTER — Other Ambulatory Visit: Payer: Self-pay | Admitting: Family Medicine

## 2020-07-15 ENCOUNTER — Other Ambulatory Visit: Payer: Self-pay

## 2020-07-15 ENCOUNTER — Ambulatory Visit (INDEPENDENT_AMBULATORY_CARE_PROVIDER_SITE_OTHER): Payer: Medicare Other | Admitting: *Deleted

## 2020-07-15 DIAGNOSIS — I2699 Other pulmonary embolism without acute cor pulmonale: Secondary | ICD-10-CM | POA: Diagnosis not present

## 2020-07-15 DIAGNOSIS — Z7901 Long term (current) use of anticoagulants: Secondary | ICD-10-CM | POA: Diagnosis not present

## 2020-07-15 DIAGNOSIS — Z5181 Encounter for therapeutic drug level monitoring: Secondary | ICD-10-CM | POA: Diagnosis not present

## 2020-07-15 LAB — POCT INR: INR: 2.9 (ref 2.0–3.0)

## 2020-07-15 NOTE — Patient Instructions (Signed)
Description   Continue taking 1/2 tablet daily except 1 tablet on Mondays, Wednesdays and Fridays. Remain consistent with two to three times a week. Recheck INR in 3 weeks. Coumadin Clinic [phone]

## 2020-07-17 ENCOUNTER — Telehealth: Payer: Self-pay | Admitting: Pharmacist

## 2020-07-17 NOTE — Chronic Care Management (AMB) (Signed)
Chronic Care Management Pharmacy Assistant   Name: Hannah [name]  MRN: [id number] DOB: Feb 08, 1940  Reason for Encounter: Disease State/ Hypertension Assessment Call.    Conditions to be addressed/monitored: HTN  Recent office visits:  None.   Recent consult visits:  07/15/20 Candance Hemphill RN (Cardiology) - seen for Anticoagulation office visit.   Hospital visits:  None in previous 6 months  Medications: Outpatient Encounter Medications as of 07/17/2020  Medication Sig   acetaminophen (TYLENOL) 500 MG tablet Take 500 mg by mouth every 6 (six) hours as needed for mild pain.    Blood Glucose Monitoring Suppl (ONETOUCH VERIO) w/Device KIT 1 each by Does not apply route as directed.   diclofenac Sodium (VOLTAREN) 1 % GEL Apply 4 g topically 4 (four) times daily. (Patient taking differently: Apply 4 g topically daily as needed.)   EUTHYROX 25 MCG tablet TAKE 1 TABLET BY MOUTH ONCE DAILY BEFORE BREAKFAST   furosemide (LASIX) 20 MG tablet Take 1 tablet (20 mg total) by mouth daily as needed for fluid or edema.   glucose blood (ONETOUCH VERIO) test strip USE AS DIRECTED ONCE  A  DAY   metFORMIN (GLUCOPHAGE-XR) 500 MG 24 hr tablet TAKE 1 TABLET BY MOUTH ONCE DAILY WITH BREAKFAST. PATIENT NEEDS AN APPT.   metoprolol tartrate (LOPRESSOR) 25 MG tablet Take 1 tablet (25 mg total) by mouth 2 (two) times daily.   OneTouch Delica Lancets 98X MISC USE 1  TO CHECK GLUCOSE ONCE DAILY   pantoprazole (PROTONIX) 40 MG tablet Take 1 tablet (40 mg total) by mouth daily.   rosuvastatin (CRESTOR) 5 MG tablet Take 1 tablet (5 mg total) by mouth daily.   vitamin B-12 (CYANOCOBALAMIN) 1000 MCG tablet Take 1 tablet (1,000 mcg total) by mouth daily.   vitamin C (ASCORBIC ACID) 500 MG tablet Take 500 mg by mouth daily.    warfarin (COUMADIN) 5 MG tablet TAKE 1/2 TO 1 (ONE-HALF TO ONE) TABLET BY MOUTH DAILY AS DIRECTED BY  COUMADIN  CLINIC (Patient taking differently: Take 2.5-5 mg by mouth See admin  instructions. Take 5 mg tablet by mouth on Mondays and Fridays, then take 2.5 mg tablet by mouth all other days per patient)   No facility-administered encounter medications on file as of 07/17/2020.   Fill History: ONETOUCH VERIO TES 05/15/2020 90   EUTHYROX 25MCG      TAB 05/25/2020 90   LISINOPRIL 40MG     TAB 02/19/2020 90   METOPROLOL TARTRATE 25MG TAB 05/25/2020 90   PANTOPRAZOLE SOD 40MG TAB 05/23/2020 30   ROSUVASTATIN CAL 5MG TAB 07/12/2020 90   SIMVASTATIN 20MG  TAB 05/23/2020 90   WARFARIN 5MG        TAB 04/14/2020 90   AMLODIPINE 2.5MG TAB 05/02/2020 90   METFORMIN ER 500MG  TAB 07/15/2020 90  Reviewed chart prior to disease state call. Spoke with patient regarding BP  Recent Office Vitals: BP Readings from Last 3 Encounters:  05/15/20 128/74  04/26/20 (!) 125/57  03/10/20 (!) 150/78   Pulse Readings from Last 3 Encounters:  05/15/20 73  04/26/20 87  03/10/20 83    Wt Readings from Last 3 Encounters:  05/15/20 184 lb (83.5 kg)  04/26/20 185 lb 3 oz (84 kg)  03/10/20 182 lb 11.2 oz (82.9 kg)     Kidney Function Lab Results  Component Value Date/Time   CREATININE 1.32 (H) 04/25/2020 12:42 AM   CREATININE 1.40 (H) 04/24/2020 04:40 AM   CREATININE 1.32 (H) 12/03/2019  01:23 PM   CREATININE 1.31 (H) 10/30/2019 11:09 AM   GFR 51.59 (L) 04/24/2019 01:49 PM   GFRNONAA 41 (L) 04/25/2020 12:42 AM   GFRNONAA 39 (L) 12/03/2019 01:23 PM   GFRAA 45 (L) 12/03/2019 01:23 PM    BMP Latest Ref Rng & Units 04/25/2020 04/24/2020 04/23/2020  Glucose 70 - 99 mg/dL 84 115(H) 151(H)  BUN 8 - 23 mg/dL '16 16 20  ' Creatinine 0.44 - 1.00 mg/dL 1.32(H) 1.40(H) 1.16(H)  BUN/Creat Ratio 6 - 22 (calc) - - -  Sodium 135 - 145 mmol/L 138 139 138  Potassium 3.5 - 5.1 mmol/L 3.8 4.2 4.4  Chloride 98 - 111 mmol/L 111 111 110  CO2 22 - 32 mmol/L 21(L) 22 22  Calcium 8.9 - 10.3 mg/dL 8.2(L) 8.4(L) 8.3(L)    Current antihypertensive regimen:  Metoprolol 30m - take 1 tablet by  mouth two times daily.   How often are you checking your Blood Pressure? daily What recent interventions/DTPs have been made by any provider to improve Blood Pressure control since last CPP Visit: None.  Any recent hospitalizations or ED visits since last visit with CPP? No  Adherence Review: Is the patient currently on ACE/ARB medication? No Does the patient have >5 day gap between last estimated fill dates? No  Notes: Spoke with patient and reviewed all medications as prescribed. Patient reported taking all medications as prescribed besides she is no longer taking pantoprazole. Patient checks her blood pressure, blood glucose and weight daily. Numbers are as reported below: 07/21/20 - BP: 154/101 BG: 127 W: 170.6lb. 07/19/20 - BP: 146/92  BG: 80  W: 170.8 07/17/20 - BP: 130/79  BG: 115  W: 170.4 Patient stated that for breakfast she typically has things like oatmeal with a cup of coffee or two pieces of toast with a cup of coffee. Sometimes patient will have an egg with a slice of bacon and biscuit or toast. Patient stated for lunch she has things like a hot dog, bologna sandwich or a can of mushroom soup with a bottle of water. Patient states her daughters help her out around the house with cleaning and bringing her dinner everyday. Patient sates they bring her things like fish with green beans and hush puppies or ribs with collard greens and corn bread. Patient does have sweet tea with dinner here and there. Patient stated she has red meat around 2-3 times a weekend she has a cup of fruit everyday. Patient states she gets around 2 big bottles of water a day or 6 of the small bottles. Patient states for activity she does chair exercises with her walker and does things around the house. She has to have her walker to get around. Her daughter comes and gets her for church every Sunday. Patient thanked me for my call.   Care Gaps:  AWV - scheduled for 03/16/21 Zoster vaccines ([facility] - never  done Ophthalmology exam - overdue since 04/04/19 Urine Microalbumin - overdue since 09/12/19 Covid - 19 vaccine - booster 4 overdue since 03/19/20  Star Rating Drugs:  Metformin 5098m- last filled on 07/15/20 90DS at [facility] Rosuvastatin 11m57m [phone]

## 2020-07-22 ENCOUNTER — Telehealth: Payer: Self-pay | Admitting: Family Medicine

## 2020-07-22 NOTE — Telephone Encounter (Signed)
She can go back to her simvastatin. If she has it at home; she can just restart 20mg  dose. If she doesn't have this at home I am ok with sending refill for her.

## 2020-07-22 NOTE — Telephone Encounter (Signed)
Pt is calling in stating that she got sick when she took the rosuvastatin (CRESTOR) 5 MG it made her sick on her stomach and stated that she will not take any more of it and would like to see if she can be put back on simvastatin (ZOCOR) 20 MG.  Pt would like to have a call back to discuss the issue with the medication.

## 2020-07-23 MED ORDER — SIMVASTATIN 20 MG PO TABS: 20.0000 mg | ORAL_TABLET | Freq: Every day | ORAL | 1 refills | Status: DC

## 2020-07-23 NOTE — Telephone Encounter (Signed)
Left a message for the pt to return my call.  

## 2020-07-23 NOTE — Telephone Encounter (Signed)
Patient called back and was informed of the message below.  Patient states she only has a few pills left and is aware refills were sent to [facility].

## 2020-07-23 NOTE — Addendum Note (Signed)
Addended by: Johnella Moloney on: 07/23/2020 09:51 AM   Modules accepted: Orders

## 2020-08-05 ENCOUNTER — Telehealth: Payer: Self-pay

## 2020-08-05 ENCOUNTER — Telehealth: Payer: Self-pay | Admitting: Physician Assistant

## 2020-08-05 ENCOUNTER — Other Ambulatory Visit: Payer: Self-pay

## 2020-08-05 ENCOUNTER — Ambulatory Visit: Payer: Medicare Other

## 2020-08-05 DIAGNOSIS — Z5181 Encounter for therapeutic drug level monitoring: Secondary | ICD-10-CM | POA: Diagnosis not present

## 2020-08-05 DIAGNOSIS — I2699 Other pulmonary embolism without acute cor pulmonale: Secondary | ICD-10-CM | POA: Diagnosis not present

## 2020-08-05 DIAGNOSIS — Z7901 Long term (current) use of anticoagulants: Secondary | ICD-10-CM

## 2020-08-05 LAB — PROTIME-INR
INR: >10 (ref 0.9–1.2)
INR: 6.4 (ref 0.8–1.2)
Prothrombin Time: 100.5 s — ABNORMAL HIGH (ref 9.1–12.0)
Prothrombin Time: 55.9 seconds — ABNORMAL HIGH (ref 11.4–15.2)

## 2020-08-05 LAB — POCT INR: INR: 8 — AB (ref 2.0–3.0)

## 2020-08-05 NOTE — Patient Instructions (Addendum)
Description   Called and spoke to pt and her daughter, Elon Jester, instructed for pt to hold warfarin 8/3-8/5, then resume normal dose of warfarin on 8/6.  Normal dose: 1/2 a tablet daily except for 1 tablet on Monday, Wednesday and Fridays. Recheck INR on 8/8 in the clinic. Coumadin Clinic [phone].

## 2020-08-05 NOTE — Telephone Encounter (Signed)
Spoke to patient's daughter and informed her of INR value and that it was indicated > 10.  I told her to ingest a can of spinach and that we will call with update.  She verbalized understanding.

## 2020-08-05 NOTE — Telephone Encounter (Signed)
Paged by lab again with INR of 6.4. No change in recommendations.

## 2020-08-05 NOTE — Telephone Encounter (Signed)
Got page from lab regarding INR of 8. Patient was seen in coumadin clinic this afternoon and noted INR of 8. Patient is already given instruction to hold coumadin.

## 2020-08-05 NOTE — Telephone Encounter (Signed)
This encounter was created in error - please disregard.

## 2020-08-05 NOTE — Addendum Note (Signed)
Addended by: Malena Peer D on: 08/05/2020 04:46 PM   Modules accepted: Orders

## 2020-08-10 ENCOUNTER — Ambulatory Visit: Payer: Medicare Other | Admitting: *Deleted

## 2020-08-10 DIAGNOSIS — Z5181 Encounter for therapeutic drug level monitoring: Secondary | ICD-10-CM | POA: Diagnosis not present

## 2020-08-10 DIAGNOSIS — Z7901 Long term (current) use of anticoagulants: Secondary | ICD-10-CM | POA: Diagnosis not present

## 2020-08-10 DIAGNOSIS — I2699 Other pulmonary embolism without acute cor pulmonale: Secondary | ICD-10-CM

## 2020-08-10 LAB — POCT INR: INR: 3.5 — AB (ref 2.0–3.0)

## 2020-08-10 MED ORDER — WARFARIN SODIUM 5 MG PO TABS: ORAL_TABLET | ORAL | 1 refills | Status: DC

## 2020-08-10 NOTE — Patient Instructions (Signed)
Description   Continue taking Warfarin 1/2 tablet daily except for 1 tablet on Monday, Wednesday and Fridays. Recheck INR in 10 days. Coumadin Clinic [phone].

## 2020-08-14 ENCOUNTER — Other Ambulatory Visit: Payer: Self-pay | Admitting: Family Medicine

## 2020-08-14 DIAGNOSIS — E1142 Type 2 diabetes mellitus with diabetic polyneuropathy: Secondary | ICD-10-CM

## 2020-08-18 ENCOUNTER — Other Ambulatory Visit: Payer: Self-pay

## 2020-08-19 ENCOUNTER — Encounter: Payer: Self-pay | Admitting: Family Medicine

## 2020-08-19 ENCOUNTER — Ambulatory Visit (INDEPENDENT_AMBULATORY_CARE_PROVIDER_SITE_OTHER): Payer: Medicare Other | Admitting: Family Medicine

## 2020-08-19 VITALS — BP 142/100 | HR 58 | Temp 97.7°F | Ht 67.0 in | Wt 172.4 lb

## 2020-08-19 DIAGNOSIS — E038 Other specified hypothyroidism: Secondary | ICD-10-CM | POA: Diagnosis not present

## 2020-08-19 DIAGNOSIS — I10 Essential (primary) hypertension: Secondary | ICD-10-CM | POA: Diagnosis not present

## 2020-08-19 DIAGNOSIS — E1140 Type 2 diabetes mellitus with diabetic neuropathy, unspecified: Secondary | ICD-10-CM

## 2020-08-19 DIAGNOSIS — E538 Deficiency of other specified B group vitamins: Secondary | ICD-10-CM

## 2020-08-19 DIAGNOSIS — N1831 Chronic kidney disease, stage 3a: Secondary | ICD-10-CM | POA: Diagnosis not present

## 2020-08-19 DIAGNOSIS — E7849 Other hyperlipidemia: Secondary | ICD-10-CM | POA: Diagnosis not present

## 2020-08-19 DIAGNOSIS — I5032 Chronic diastolic (congestive) heart failure: Secondary | ICD-10-CM

## 2020-08-19 NOTE — Progress Notes (Signed)
Hannah [name] DOB: 25-Sep-1940 Encounter date: 08/19/2020  This is a 80 y.o. female who presents with Chief Complaint  Patient presents with   Follow-up     History of present illness: Last visit with me was 06/01/2020.   Friend passed away this morning so thinks that may have elevated it. Has been checking bp at home and states it has not been high.  *we discussed her not feeling well. She was tired, light headed. Feels like this has resolved. She is excited that grandson is home from the service  Aid still coming out M, W, F. She helps with bath, fixes her lunch, helps around house. (With caring hands). Checking bp, blood sugar every time she comes.   HTN: hasn't had low blood pressures at home. She isn't sure exact numbers, but thinking most recent was 510'C systolic.   Allergies  Allergen Reactions   Augmentin [Amoxicillin-Pot Clavulanate] Itching and Other (See Comments)    Severe vaginal itching   Tranxene [Clorazepate] Itching   Crestor [Rosuvastatin]     Made her sick on stomach, she is able to tolerate the zocor   Penicillins Itching and Rash    Has patient had a PCN reaction causing immediate rash, facial/tongue/throat swelling, SOB or lightheadedness with hypotension: Yes Has patient had a PCN reaction causing severe rash involving mucus membranes or skin necrosis: No Has patient had a PCN reaction that required hospitalization: No Has patient had a PCN reaction occurring within the last 10 years: No If all of the above answers are "NO", then may proceed with Cephalosporin use.    Current Meds  Medication Sig   acetaminophen (TYLENOL) 500 MG tablet Take 500 mg by mouth every 6 (six) hours as needed for mild pain.    Blood Glucose Monitoring Suppl (ONETOUCH VERIO) w/Device KIT 1 each by Does not apply route as directed.   diclofenac Sodium (VOLTAREN) 1 % GEL Apply 4 g topically 4 (four) times daily. (Patient taking differently: Apply 4 g topically daily as needed.)    EUTHYROX 25 MCG tablet TAKE 1 TABLET BY MOUTH ONCE DAILY BEFORE BREAKFAST   furosemide (LASIX) 20 MG tablet Take 1 tablet (20 mg total) by mouth daily as needed for fluid or edema.   glucose blood (ONETOUCH VERIO) test strip USE 1 STRIP TO CHECK GLUCOSE ONCE DAILY AS DIRECTED   metFORMIN (GLUCOPHAGE-XR) 500 MG 24 hr tablet TAKE 1 TABLET BY MOUTH ONCE DAILY WITH BREAKFAST. PATIENT NEEDS AN APPT.   metoprolol tartrate (LOPRESSOR) 25 MG tablet Take 1 tablet (25 mg total) by mouth 2 (two) times daily.   OneTouch Delica Lancets 58N MISC USE 1  TO CHECK GLUCOSE ONCE DAILY   pantoprazole (PROTONIX) 40 MG tablet Take 1 tablet (40 mg total) by mouth daily.   simvastatin (ZOCOR) 20 MG tablet Take 1 tablet (20 mg total) by mouth at bedtime.   vitamin B-12 (CYANOCOBALAMIN) 1000 MCG tablet Take 1 tablet (1,000 mcg total) by mouth daily.   vitamin C (ASCORBIC ACID) 500 MG tablet Take 500 mg by mouth daily.    warfarin (COUMADIN) 5 MG tablet TAKE 1/2 TO 1 (ONE-HALF TO ONE) TABLET BY MOUTH DAILY AS DIRECTED BY  COUMADIN  CLINIC    Review of Systems  Constitutional:  Negative for chills, fatigue and fever.  Respiratory:  Negative for cough, chest tightness, shortness of breath and wheezing.   Cardiovascular:  Negative for chest pain, palpitations and leg swelling.  Neurological:  Negative for dizziness, light-headedness and headaches.  Objective:  BP (!) 142/100 (BP Location: Left Arm, Patient Position: Sitting, Cuff Size: Large)   Pulse (!) 58   Temp 97.7 F (36.5 C) (Oral)   Ht '5\' 7"'  (1.702 m)   Wt 172 lb 6.4 oz (78.2 kg)   BMI 27.00 kg/m   Weight: 172 lb 6.4 oz (78.2 kg)   BP Readings from Last 3 Encounters:  08/19/20 (!) 142/100  05/15/20 128/74  04/26/20 (!) 125/57   Wt Readings from Last 3 Encounters:  08/19/20 172 lb 6.4 oz (78.2 kg)  05/15/20 184 lb (83.5 kg)  04/26/20 185 lb 3 oz (84 kg)    Physical Exam Constitutional:      General: She is not in acute distress.    Appearance:  She is well-developed.  Cardiovascular:     Rate and Rhythm: Normal rate and regular rhythm.     Heart sounds: Normal heart sounds. No murmur heard.   No friction rub.  Pulmonary:     Effort: Pulmonary effort is normal. No respiratory distress.     Breath sounds: Normal breath sounds. No wheezing or rales.  Musculoskeletal:     Right lower leg: No edema.     Left lower leg: No edema.  Neurological:     Mental Status: She is alert and oriented to person, place, and time.  Psychiatric:        Behavior: Behavior normal.    Assessment/Plan  1. Chronic diastolic CHF (congestive heart failure) (HCC) Has been stable. Does follow regularly with cardiology. See below  2. Primary hypertension We will make sure home numbers are controlled. Aid has been checking, but she can't recall numbers and didn't bring book. If regularly over 135/85 she will let me know. My recheck today was higher. She just received news of friend passing unexpectedly. She was on amlodipine AND lisinopril until her hospital stay in April and both were stopped at discharge. May need to have something added back in for baseline control.  - CBC with Differential/Platelet; Future - Comprehensive metabolic panel; Future  3. Other specified hypothyroidism Has been well controlled. Recheck levels today. - TSH; Future  4. Controlled type 2 diabetes mellitus with diabetic neuropathy, without long-term current use of insulin ([place]) She is getting regular sugar checks by aid. No issues with hypoglycemia; and patient states no significant high sugars.  - Hemoglobin A1c; Future  5. Chronic kidney disease, stage 3a ([place]) Has been stable. Will recheck bloodwork today.   6. Other hyperlipidemia Didn't tolerate crestor. She does well with zocor and would like to stay on this medication. Lipids have been well controlled.  - Lipid panel; Future  7. B12 deficiency - Vitamin B12; Future - Homocysteine; Future - Folate; Future -  Methylmalonic acid, serum; Future   Return in about 6 months (around 02/19/2021) for Chronic condition visit.     Micheline Rough, MD

## 2020-08-19 NOTE — Patient Instructions (Addendum)
*  continue to check and record blood pressures at home. If you are regularly seeing pressures over 135/85 let me know. As long as pressures are running well, we can do a 6 month follow up. If your pressures are elevated let me know and we will make a med adjustment and plan to see you a little sooner.

## 2020-08-20 ENCOUNTER — Other Ambulatory Visit: Payer: Self-pay

## 2020-08-20 ENCOUNTER — Ambulatory Visit: Payer: Medicare Other | Admitting: *Deleted

## 2020-08-20 DIAGNOSIS — Z5181 Encounter for therapeutic drug level monitoring: Secondary | ICD-10-CM | POA: Diagnosis not present

## 2020-08-20 DIAGNOSIS — Z7901 Long term (current) use of anticoagulants: Secondary | ICD-10-CM

## 2020-08-20 DIAGNOSIS — I2699 Other pulmonary embolism without acute cor pulmonale: Secondary | ICD-10-CM | POA: Diagnosis not present

## 2020-08-20 LAB — COMPREHENSIVE METABOLIC PANEL
ALT: 6 U/L (ref 0–35)
AST: 14 U/L (ref 0–37)
Albumin: 3.9 g/dL (ref 3.5–5.2)
Alkaline Phosphatase: 54 U/L (ref 39–117)
BUN: 10 mg/dL (ref 6–23)
CO2: 24 mEq/L (ref 19–32)
Calcium: 9.5 mg/dL (ref 8.4–10.5)
Chloride: 105 mEq/L (ref 96–112)
Creatinine, Ser: 1.01 mg/dL (ref 0.40–1.20)
GFR: 53.04 mL/min — ABNORMAL LOW (ref >60.00)
Glucose, Bld: 98 mg/dL (ref 70–99)
Potassium: 4.1 mEq/L (ref 3.5–5.1)
Sodium: 141 mEq/L (ref 135–145)
Total Bilirubin: 0.6 mg/dL (ref 0.2–1.2)
Total Protein: 6.8 g/dL (ref 6.0–8.3)

## 2020-08-20 LAB — CBC WITH DIFFERENTIAL/PLATELET
Basophils Absolute: 0.1 10*3/uL (ref 0.0–0.1)
Basophils Relative: 0.9 % (ref 0.0–3.0)
Eosinophils Absolute: 0.2 10*3/uL (ref 0.0–0.7)
Eosinophils Relative: 2 % (ref 0.0–5.0)
HCT: 37 % (ref 36.0–46.0)
Hemoglobin: 11.3 g/dL — ABNORMAL LOW (ref 12.0–15.0)
Lymphocytes Relative: 38.2 % (ref 12.0–46.0)
Lymphs Abs: 3.4 10*3/uL (ref 0.7–4.0)
MCHC: 30.5 g/dL (ref 30.0–36.0)
MCV: 73.5 fl — ABNORMAL LOW (ref 78.0–100.0)
Monocytes Absolute: 0.4 10*3/uL (ref 0.1–1.0)
Monocytes Relative: 4.7 % (ref 3.0–12.0)
Neutro Abs: 4.8 10*3/uL (ref 1.4–7.7)
Neutrophils Relative %: 54.2 % (ref 43.0–77.0)
Platelets: 317 10*3/uL (ref 150.0–400.0)
RBC: 5.04 Mil/uL (ref 3.87–5.11)
RDW: 23.3 % — ABNORMAL HIGH (ref 11.5–15.5)
WBC: 8.8 10*3/uL (ref 4.0–10.5)

## 2020-08-20 LAB — LIPID PANEL
Cholesterol: 149 mg/dL (ref 0–200)
HDL: 53.1 mg/dL (ref >39.00)
LDL Cholesterol: 74 mg/dL (ref 0–99)
NonHDL: 96.26
Total CHOL/HDL Ratio: 3
Triglycerides: 112 mg/dL (ref 0.0–149.0)
VLDL: 22.4 mg/dL (ref 0.0–40.0)

## 2020-08-20 LAB — POCT INR: INR: 6.1 — AB (ref 2.0–3.0)

## 2020-08-20 LAB — TSH: TSH: 2.93 u[IU]/mL (ref 0.35–5.50)

## 2020-08-20 LAB — HEMOGLOBIN A1C: Hgb A1c MFr Bld: 6.7 % — ABNORMAL HIGH (ref 4.6–6.5)

## 2020-08-20 LAB — PROTIME-INR
INR: 6.6 (ref 0.9–1.2)
Prothrombin Time: 61.9 s — ABNORMAL HIGH (ref 9.1–12.0)

## 2020-08-20 LAB — VITAMIN B12: Vitamin B-12: 1096 pg/mL — ABNORMAL HIGH (ref 211–911)

## 2020-08-20 LAB — FOLATE: Folate: 5.5 ng/mL — ABNORMAL LOW (ref >5.9)

## 2020-08-20 NOTE — Patient Instructions (Addendum)
Description    Called and spoke to pt's daughter and instructed for pt to, Hold warfarin 8/18, 8/19 and 8/20. Then START taking warfarin 1/2 a tablet daily except for 1 tablet on Mondays and Fridays. Recheck INR in 1 week. [facility] [phone].

## 2020-08-23 LAB — METHYLMALONIC ACID, SERUM: Methylmalonic Acid, Quant: 118 nmol/L (ref 87–318)

## 2020-08-23 LAB — HOMOCYSTEINE: Homocysteine: 18.4 umol/L — ABNORMAL HIGH (ref <10.4)

## 2020-08-25 MED ORDER — LEVOTHYROXINE SODIUM 25 MCG PO TABS: 25.0000 ug | ORAL_TABLET | Freq: Every day | ORAL | 1 refills | Status: DC

## 2020-08-25 MED ORDER — METOPROLOL TARTRATE 25 MG PO TABS: 25.0000 mg | ORAL_TABLET | Freq: Two times a day (BID) | ORAL | 1 refills | Status: DC

## 2020-08-25 NOTE — Addendum Note (Signed)
Addended by: Johnella Moloney on: 08/25/2020 08:41 AM   Modules accepted: Orders

## 2020-08-26 ENCOUNTER — Other Ambulatory Visit: Payer: Self-pay

## 2020-08-26 ENCOUNTER — Ambulatory Visit: Payer: Medicare Other | Admitting: *Deleted

## 2020-08-26 DIAGNOSIS — I2699 Other pulmonary embolism without acute cor pulmonale: Secondary | ICD-10-CM | POA: Diagnosis not present

## 2020-08-26 DIAGNOSIS — Z5181 Encounter for therapeutic drug level monitoring: Secondary | ICD-10-CM | POA: Diagnosis not present

## 2020-08-26 DIAGNOSIS — Z7901 Long term (current) use of anticoagulants: Secondary | ICD-10-CM | POA: Diagnosis not present

## 2020-08-26 LAB — POCT INR: INR: 2.7 (ref 2.0–3.0)

## 2020-08-26 NOTE — Patient Instructions (Signed)
Description   Continue taking warfarin 1/2 tablet daily except for 1 tablet on Mondays and Fridays. Keep a green leafy veggie in your diet 2-3 times a week. Recheck INR in 1 week. [facility] [phone].

## 2020-09-02 ENCOUNTER — Ambulatory Visit: Payer: Medicare Other

## 2020-09-02 ENCOUNTER — Other Ambulatory Visit: Payer: Self-pay

## 2020-09-02 DIAGNOSIS — Z5181 Encounter for therapeutic drug level monitoring: Secondary | ICD-10-CM

## 2020-09-02 DIAGNOSIS — I2699 Other pulmonary embolism without acute cor pulmonale: Secondary | ICD-10-CM | POA: Diagnosis not present

## 2020-09-02 DIAGNOSIS — Z7901 Long term (current) use of anticoagulants: Secondary | ICD-10-CM

## 2020-09-02 LAB — POCT INR: INR: 2.9 (ref 2.0–3.0)

## 2020-09-02 NOTE — Patient Instructions (Signed)
Description   Continue taking warfarin 1/2 tablet daily except for 1 tablet on Mondays and Fridays. Keep a green leafy veggie in your diet 2-3 times a week. Recheck INR in 3 weeks. [facility] [phone].

## 2020-09-09 ENCOUNTER — Telehealth: Payer: Self-pay | Admitting: Pharmacist

## 2020-09-09 NOTE — Chronic Care Management (AMB) (Signed)
Chronic Care Management Pharmacy Assistant   Name: Hannah [name]  MRN: [id number] DOB: 07/12/1940  Reason for Encounter: Disease State/ Hypertension Assessment Call   Conditions to be addressed/monitored: HTN  Recent office visits:  08/19/2020 Micheline Rough MD(PCP) - Seen for Chronic diastolic CHF and other issues. No medication changes. Follow up due in 6 months.  Recent consult visits:  09/02/2020 Lisette Abu RN (Cardiology) - Seen for anticoagulation visit.   Hospital visits:  None in previous 6 months  Medications: Outpatient Encounter Medications as of 09/09/2020  Medication Sig   acetaminophen (TYLENOL) 500 MG tablet Take 500 mg by mouth every 6 (six) hours as needed for mild pain.    Blood Glucose Monitoring Suppl (ONETOUCH VERIO) w/Device KIT 1 each by Does not apply route as directed.   diclofenac Sodium (VOLTAREN) 1 % GEL Apply 4 g topically 4 (four) times daily. (Patient taking differently: Apply 4 g topically daily as needed.)   furosemide (LASIX) 20 MG tablet Take 1 tablet (20 mg total) by mouth daily as needed for fluid or edema.   glucose blood (ONETOUCH VERIO) test strip USE 1 STRIP TO CHECK GLUCOSE ONCE DAILY AS DIRECTED   levothyroxine (EUTHYROX) 25 MCG tablet Take 1 tablet (25 mcg total) by mouth daily before breakfast.   metFORMIN (GLUCOPHAGE-XR) 500 MG 24 hr tablet TAKE 1 TABLET BY MOUTH ONCE DAILY WITH BREAKFAST. PATIENT NEEDS AN APPT.   metoprolol tartrate (LOPRESSOR) 25 MG tablet Take 1 tablet (25 mg total) by mouth 2 (two) times daily.   OneTouch Delica Lancets 80H MISC USE 1  TO CHECK GLUCOSE ONCE DAILY   pantoprazole (PROTONIX) 40 MG tablet Take 1 tablet (40 mg total) by mouth daily.   simvastatin (ZOCOR) 20 MG tablet Take 1 tablet (20 mg total) by mouth at bedtime.   vitamin B-12 (CYANOCOBALAMIN) 1000 MCG tablet Take 1 tablet (1,000 mcg total) by mouth daily.   vitamin C (ASCORBIC ACID) 500 MG tablet Take 500 mg by mouth daily.    warfarin  (COUMADIN) 5 MG tablet TAKE 1/2 TO 1 (ONE-HALF TO ONE) TABLET BY MOUTH DAILY AS DIRECTED BY  [facility]   No facility-administered encounter medications on file as of 09/09/2020.   Fill History:  EUTHYROX 25MCG      TAB 08/25/2020 90   METOPROLOL TARTRATE 25MG TAB 08/25/2020 90   PANTOPRAZOLE SOD 40MG TAB 05/23/2020 30   SIMVASTATIN 20MG  TAB 07/30/2020 90   WARFARIN SODIUM  5 MG TABS 08/10/2020 80   METFORMIN ER 500MG  TAB 07/15/2020 90   Reviewed chart prior to disease state call. Spoke with patient regarding BP  Recent Office Vitals: BP Readings from Last 3 Encounters:  08/19/20 (!) 142/100  05/15/20 128/74  04/26/20 (!) 125/57   Pulse Readings from Last 3 Encounters:  08/19/20 (!) 58  05/15/20 73  04/26/20 87    Wt Readings from Last 3 Encounters:  08/19/20 172 lb 6.4 oz (78.2 kg)  05/15/20 184 lb (83.5 kg)  04/26/20 185 lb 3 oz (84 kg)     Kidney Function Lab Results  Component Value Date/Time   CREATININE 1.01 08/19/2020 03:16 PM   CREATININE 1.32 (H) 04/25/2020 12:42 AM   CREATININE 1.32 (H) 12/03/2019 01:23 PM   CREATININE 1.31 (H) 10/30/2019 11:09 AM   GFR 53.04 (L) 08/19/2020 03:16 PM   GFRNONAA 41 (L) 04/25/2020 12:42 AM   GFRNONAA 39 (L) 12/03/2019 01:23 PM   GFRAA 45 (L) 12/03/2019 01:23 PM  BMP Latest Ref Rng & Units 08/19/2020 04/25/2020 04/24/2020  Glucose 70 - 99 mg/dL 98 84 115(H)  BUN 6 - 23 mg/dL _0 Creatinine 0.40 - 1.20 mg/dL 1.01 1.32(H) 1.40(H)  BUN/Creat Ratio 6 - 22 (calc) - - -  Sodium 135 - 145 mEq/L 141 138 139  Potassium 3.5 - 5.1 mEq/L 4.1 3.8 4.2  Chloride 96 - 112 mEq/L 105 111 111  CO2 19 - 32 mEq/L 24 21(L) 22  Calcium 8.4 - 10.5 mg/dL 9.5 8.2(L) 8.4(L)    Current antihypertensive regimen:  Metoprolol tartate 83m - take 1 tablet twice daily.  How often are you checking your Blood Pressure? daily Current home BP readings: Today was 142/73. 09/08/20 - 160/133. 09/07/20 - 160/99. 09/06/20  - 172/114.  What recent  interventions/DTPs have been made by any provider to improve Blood Pressure control since last CPP Visit: None Any recent hospitalizations or ED visits since last visit with CPP? No  Adherence Review: Is the patient currently on ACE/ARB medication? No Does the patient have >5 day gap between last estimated fill dates? No  Notes: Spoke with patient and reviewed all medications as prescribed. Patient reports taking all medications except for the pantoprazole and she is only taking her vitamin B-12 every other day per her PCP due to better lab results recently. Patient reports no issues from medications at this time. Patient has a home health nurse who checks her blood pressure daily and helps patient bathe and clean around home. Patient states for breakfast she tends to have either a donut or a piece of toast with a cup of coffee. Patient stated for lunch either her one of her daughters will bring her something like fast food or she will have something at home like a PB&J sandwich or a salad. Patient stated she does like to eat a lot of canned mushroom soup. Patient does not add salt to her food. For dinner patient stated she will have a steak twice a week and a fish like salmon or tuna the other nights with a vegetable and starch. Patient drinks around 4 16 ounce bottle of water a day and she does not like soda. For activity patient does some chair exercises sitting on her walker daily and she is able to walk some with her walker. Patient stated she is able to perform all ADL'S on her own besides the bathing. Patients appointment was rescheduled. Patient thanked me for my call.  Care Gaps:  AWV - Scheduled for 03/16/2021 Zoster vaccines - never done Ophthalmology - overdue as of 04/04/2019 Urine Microalbumin - overdue as of 09/12/2019 Covid 19 vaccine booster 4 - overdue as of 03/19/2020 Flu vaccine - due  Star Rating Drugs:  Metformin 253m-  last filled 07/15/2020 90DS filled at [facility]   Simvastatin 207m last filled 07/30/2020 90DS [phone]

## 2020-09-23 ENCOUNTER — Ambulatory Visit: Payer: Medicare Other | Admitting: *Deleted

## 2020-09-23 ENCOUNTER — Other Ambulatory Visit: Payer: Self-pay

## 2020-09-23 DIAGNOSIS — Z7901 Long term (current) use of anticoagulants: Secondary | ICD-10-CM | POA: Diagnosis not present

## 2020-09-23 DIAGNOSIS — I2699 Other pulmonary embolism without acute cor pulmonale: Secondary | ICD-10-CM | POA: Diagnosis not present

## 2020-09-23 DIAGNOSIS — Z5181 Encounter for therapeutic drug level monitoring: Secondary | ICD-10-CM

## 2020-09-23 LAB — POCT INR: INR: 2.7 (ref 2.0–3.0)

## 2020-09-23 NOTE — Patient Instructions (Signed)
Description   Continue taking warfarin 1/2 tablet daily except for 1 tablet on Mondays and Fridays. Keep a green leafy veggie in your diet 2-3 times a week. Recheck INR in 4 weeks. [facility] [phone].

## 2020-10-01 ENCOUNTER — Telehealth: Payer: Medicare Other

## 2020-10-16 ENCOUNTER — Telehealth: Payer: Self-pay

## 2020-10-16 ENCOUNTER — Other Ambulatory Visit: Payer: Self-pay | Admitting: Family Medicine

## 2020-10-16 NOTE — Telephone Encounter (Signed)
Patient called requesting Rx refill  metFORMIN (GLUCOPHAGE-XR) 500 MG 24 hr tablet

## 2020-10-16 NOTE — Telephone Encounter (Signed)
Rx done. 

## 2020-10-21 ENCOUNTER — Ambulatory Visit: Payer: Medicare Other

## 2020-10-21 ENCOUNTER — Other Ambulatory Visit: Payer: Self-pay

## 2020-10-21 DIAGNOSIS — Z5181 Encounter for therapeutic drug level monitoring: Secondary | ICD-10-CM

## 2020-10-21 DIAGNOSIS — Z7901 Long term (current) use of anticoagulants: Secondary | ICD-10-CM

## 2020-10-21 DIAGNOSIS — I2699 Other pulmonary embolism without acute cor pulmonale: Secondary | ICD-10-CM

## 2020-10-21 LAB — POCT INR: INR: 1.6 — AB (ref 2.0–3.0)

## 2020-10-21 NOTE — Patient Instructions (Signed)
Description   Start taking warfarin 1/2 tablet daily except for 1 tablet on Mondays, Wednesdays and Fridays. Keep a green leafy veggie in your diet 2-3 times a week. Recheck INR in 2 weeks. [facility] [phone].

## 2020-11-04 ENCOUNTER — Other Ambulatory Visit: Payer: Self-pay

## 2020-11-04 ENCOUNTER — Ambulatory Visit: Payer: Medicare Other | Admitting: *Deleted

## 2020-11-04 DIAGNOSIS — I2699 Other pulmonary embolism without acute cor pulmonale: Secondary | ICD-10-CM

## 2020-11-04 DIAGNOSIS — Z7901 Long term (current) use of anticoagulants: Secondary | ICD-10-CM

## 2020-11-04 DIAGNOSIS — Z5181 Encounter for therapeutic drug level monitoring: Secondary | ICD-10-CM

## 2020-11-04 LAB — POCT INR: INR: 3.5 — AB (ref 2.0–3.0)

## 2020-11-04 NOTE — Patient Instructions (Signed)
Description   Continue taking warfarin 1/2 tablet daily except for 1 tablet on Mondays, Wednesdays and Fridays. Keep a green leafy veggie in your diet 2-3 times a week. Recheck INR in 3 weeks. [facility] [phone].

## 2020-11-05 ENCOUNTER — Telehealth: Payer: Self-pay | Admitting: Pharmacist

## 2020-11-05 NOTE — Chronic Care Management (AMB) (Signed)
    Chronic Care Management Pharmacy Assistant   Name: Hannah [name]  MRN: [id number] DOB: January 31, 1940   11/06/20 APPOINTMENT REMINDER    Called [name], No answer, left message of appointment on 11/06/20 at 9am via telephone visit with Gaylord Shih, Pharm D. Notified to have all medications, supplements, blood pressure and/or blood sugar logs available during appointment and to return call if need to reschedule.   Care Gaps:  AWV - scheduled for 03/16/21 Zoster vaccines - never done Pneumonia vaccine - overdue since 2019 Ophthalmology exam - overdue since 2021 Urine microalbumin - overdue since 2021 Covid-19 vaccine booster 4 - overdue since 2/22 Flu vaccine - due Foot exam - overdue since 10/29/20 Last PCP BP: 142/100 P: 58 on 08/19/20 Last A1C:  Lab Results  Component Value Date   HGBA1C 6.7 (H) 08/19/2020    Star Rating Drug:  Metformin 500mg  - last filled on 10/16/20 90DS at [facility] Simvastatin 20mg  - last filled on 10/26/20 90DS at [facility]  Any gaps in medications fill history? No10/26/22 CMA  Clinical Pharmacist Assistant [phone]

## 2020-11-06 ENCOUNTER — Ambulatory Visit (INDEPENDENT_AMBULATORY_CARE_PROVIDER_SITE_OTHER): Payer: Medicare Other | Admitting: Pharmacist

## 2020-11-06 DIAGNOSIS — I10 Essential (primary) hypertension: Secondary | ICD-10-CM

## 2020-11-06 DIAGNOSIS — E1140 Type 2 diabetes mellitus with diabetic neuropathy, unspecified: Secondary | ICD-10-CM

## 2020-11-06 NOTE — Progress Notes (Signed)
Chronic Care Management Pharmacy Note  11/09/2020 Name:  Hannah [name] MRN:  [id number] DOB:  Mar 03, 1940  Summary: BP not at goal < 140/90 per home readings LDL not at goal < 70 A1c at goal < 7%  Recommendations/Changes made from today's visit: -Recommend starting lisinopril 10 mg for BP lowering -Recommended continued BP and BG home monitoring  Plan: BP assessment in 1 month Follow up in 4 months   Subjective: Hannah [name] is an 80 y.o. year old female who is a primary patient of Koberlein, Steele Berg, MD.  The CCM team was consulted for assistance with disease management and care coordination needs.    Engaged with patient by telephone for follow up visit in response to provider referral for pharmacy case management and/or care coordination services.   Consent to Services:  The patient was given information about Chronic Care Management services, agreed to services, and gave verbal consent prior to initiation of services.  Please see initial visit note for detailed documentation.   Patient Care Team: [name], MD as PCP - General (Family Medicine) Fay Records, MD as PCP - Cardiology (Cardiology) [name], [facility] as Pharmacist (Pharmacist)  Recent office visits: 08/19/2020 Micheline Rough MD: Seen for Chronic diastolic CHF and other issues. No medication changes. Follow up due in 6 months.  07/22/20 Telephone encounter: Patient switched back to simvastatin as rosuvastatin made her sick to her stomach.  05/15/20 [name], MD: patient seen for hospitalization follow-up.  03/10/20 [name], LPN: Presented for Medicare Wellness Exam.  Recent consult visits: 11/04/20 Ovidio Kin, RN (cardiology): Patient presented for anticoag visit. INR 3.5, goal 2.5-3.5. Continued 5 mg (5 mg x 1) every Mon, Wed, Fri; 2.5 mg (5 mg x 0.5) all other days. Recheck in 3 weeks.   Hospital visits: Medication Reconciliation was completed by comparing discharge  summary, patient's EMR and Pharmacy list, and upon discussion with patient.   Admitted to the hospital on 04/22/2020 due to Acute Gl bleeding. Discharge date was 04/26/2020. Discharged from [facility]?Medications Started at [facility] Discharge:?? -started the following medication due to GI bleed Pantoprazole (PROTONIX) 40 mg one tablet daily   Medication Changes at Hospital Discharge: -Changed  None   Medications Discontinued at Hospital Discharge: -Stopped Amlodipine 2.5 MG tablet (NORVASC) Lisinopril 40 MG tablet (ZESTRIL)   Medications that remain the same after Hospital Discharge:?? -All other medications will remain the same.     Objective:  Lab Results  Component Value Date   CREATININE 1.01 08/19/2020   BUN 10 08/19/2020   GFR 53.04 (L) 08/19/2020   GFRNONAA 41 (L) 04/25/2020   GFRAA 45 (L) 12/03/2019   NA 141 08/19/2020   K 4.1 08/19/2020   CALCIUM 9.5 08/19/2020   CO2 24 08/19/2020   GLUCOSE 98 08/19/2020    Lab Results  Component Value Date/Time   HGBA1C 6.7 (H) 08/19/2020 03:16 PM   HGBA1C 6.4 (H) 04/23/2020 04:40 AM   GFR 53.04 (L) 08/19/2020 03:16 PM   GFR 51.59 (L) 04/24/2019 01:49 PM   MICROALBUR 4.3 (H) 09/12/2018 03:07 PM   MICROALBUR 6.2 (H) 04/10/2015 09:01 AM    Last diabetic Eye exam:  Lab Results  Component Value Date/Time   HMDIABEYEEXA No Retinopathy 06/23/2015 12:00 AM    Last diabetic Foot exam: No results found for: HMDIABFOOTEX   Lab Results  Component Value Date   CHOL 149 08/19/2020   HDL 53.10 08/19/2020  LDLCALC 74 08/19/2020   TRIG 112.0 08/19/2020   CHOLHDL 3 08/19/2020    Hepatic Function Latest Ref Rng & Units 08/19/2020 04/22/2020 10/30/2019  Total Protein 6.0 - 8.3 g/dL 6.8 6.4(L) 7.5  Albumin 3.5 - 5.2 g/dL 3.9 3.4(L) -  AST 0 - 37 U/L '14 19 18  ' ALT 0 - 35 U/L '6 13 10  ' Alk Phosphatase 39 - 117 U/L 54 57 -  Total Bilirubin 0.2 - 1.2 mg/dL 0.6 0.6 0.5    Lab Results  Component Value  Date/Time   TSH 2.93 08/19/2020 03:16 PM   TSH 1.48 10/30/2019 11:09 AM   FREET4 1.09 02/25/2013 12:23 PM    CBC Latest Ref Rng & Units 08/19/2020 04/26/2020 04/26/2020  WBC 4.0 - 10.5 K/uL 8.8 - 9.3  Hemoglobin 12.0 - 15.0 g/dL 11.3(L) 7.7(L) 7.5(L)  Hematocrit 36.0 - 46.0 % 37.0 24.6(L) 24.4(L)  Platelets 150.0 - 400.0 K/uL 317.0 - 148(L)    No results found for: VD25OH  Clinical ASCVD: No  The 10-year ASCVD risk score (Arnett DK, et al., 2019) is: 55.7%   Values used to calculate the score:     Age: 74 years     Sex: Female     Is Non-Hispanic African American: Yes     Diabetic: Yes     Tobacco smoker: Yes     Systolic Blood Pressure: 631 mmHg     Is BP treated: Yes     HDL Cholesterol: 53.1 mg/dL     Total Cholesterol: 149 mg/dL    Depression screen [facility] 2/9 03/10/2020 10/30/2019 09/13/2018  Decreased Interest 0 0 0  Down, Depressed, Hopeless 0 0 0  PHQ - 2 Score 0 0 0  Some recent data might be hidden    Social History   Tobacco Use  Smoking Status Former   Packs/day: 1.00   Years: 10.00   Pack years: 10.00   Types: Cigarettes   Quit date: 01/04/1968   Years since quitting: 52.8  Smokeless Tobacco Never   BP Readings from Last 3 Encounters:  08/19/20 (!) 142/100  05/15/20 128/74  04/26/20 (!) 125/57   Pulse Readings from Last 3 Encounters:  08/19/20 (!) 58  05/15/20 73  04/26/20 87   Wt Readings from Last 3 Encounters:  08/19/20 172 lb 6.4 oz (78.2 kg)  05/15/20 184 lb (83.5 kg)  04/26/20 185 lb 3 oz (84 kg)   BMI Readings from Last 3 Encounters:  08/19/20 27.00 kg/m  05/15/20 28.82 kg/m  04/26/20 29.00 kg/m    Assessment/Interventions: Review of patient past medical history, allergies, medications, health status, including review of consultants reports, laboratory and other test data, was performed as part of comprehensive evaluation and provision of chronic care management services.   SDOH:  (Social Determinants of Health) assessments and  interventions performed: No     SDOH Screenings   Alcohol Screen: Not on file  Depression (PHQ2-9): Low Risk    PHQ-2 Score: 0  Financial Resource Strain: Low Risk    Difficulty of Paying Living Expenses: Not hard at all  Food Insecurity: No Food Insecurity   Worried About Charity fundraiser in the Last Year: Never true   Ran Out of Food in the Last Year: Never true  Housing: Low Risk    Last Housing Risk Score: 0  Physical Activity: Insufficiently Active   Days of Exercise per Week: 5 days   Minutes of Exercise per Session: 10 min  Social Connections: Moderately Integrated  Frequency of Communication with Friends and Family: More than three times a week   Frequency of Social Gatherings with Friends and Family: More than three times a week   Attends Religious Services: More than 4 times per year   Active Member of [employer] or Organizations: Yes   Attends Archivist Meetings: 1 to 4 times per year   Marital Status: Widowed  Stress: No Stress Concern Present   Feeling of Stress : Not at all  Tobacco Use: Medium Risk   Smoking Tobacco Use: Former   Smokeless Tobacco Use: Never   Passive Exposure: Not on file  Transportation Needs: No Transportation Needs   Lack of Transportation (Medical): No   Lack of Transportation (Non-Medical): No   CCM Care Plan  Allergies  Allergen Reactions   Augmentin [Amoxicillin-Pot Clavulanate] Itching and Other (See Comments)    Severe vaginal itching   Tranxene [Clorazepate] Itching   Crestor [Rosuvastatin]     Made her sick on stomach, she is able to tolerate the zocor   Penicillins Itching and Rash    Has patient had a PCN reaction causing immediate rash, facial/tongue/throat swelling, SOB or lightheadedness with hypotension: Yes Has patient had a PCN reaction causing severe rash involving mucus membranes or skin necrosis: No Has patient had a PCN reaction that required hospitalization: No Has patient had a PCN reaction occurring  within the last 10 years: No If all of the above answers are "NO", then may proceed with Cephalosporin use.     Medications Reviewed Today     Reviewed by Agnes Lawrence, CMA (Certified Medical Assistant) on 08/19/20 at 52  Med List Status: <None>   Medication Order Taking? Sig Documenting Provider Last Dose Status Informant  acetaminophen (TYLENOL) 500 MG tablet [id number] Yes Take 500 mg by mouth every 6 (six) hours as needed for mild pain.  [provider] Taking Active Self  Blood Glucose Monitoring Suppl (ONETOUCH VERIO) w/Device KIT [id number] Yes 1 each by Does not apply route as directed. [name], MD Taking Active Self  diclofenac Sodium (VOLTAREN) 1 % GEL [id number] Yes Apply 4 g topically 4 (four) times daily.  Patient taking differently: Apply 4 g topically daily as needed.   Chase Picket, MD Taking Active Self  EUTHYROX 25 MCG tablet [id number] Yes TAKE 1 TABLET BY MOUTH ONCE DAILY BEFORE BREAKFAST Koberlein, Junell C, MD Taking Active   furosemide (LASIX) 20 MG tablet [id number] Yes Take 1 tablet (20 mg total) by mouth daily as needed for fluid or edema. Lucretia Kern, DO Taking Active Self  glucose blood (ONETOUCH VERIO) test strip [id number] Yes USE 1 STRIP TO CHECK GLUCOSE ONCE DAILY AS DIRECTED Koberlein, Junell C, MD Taking Active   metFORMIN (GLUCOPHAGE-XR) 500 MG 24 hr tablet [id number] Yes TAKE 1 TABLET BY MOUTH ONCE DAILY WITH BREAKFAST. PATIENT NEEDS AN APPT. [name], MD Taking Active   metoprolol tartrate (LOPRESSOR) 25 MG tablet [id number] Yes Take 1 tablet (25 mg total) by mouth 2 (two) times daily. [name], MD Taking Active   OneTouch Delica Lancets 16O [place] [id number] Yes USE 1  TO CHECK GLUCOSE ONCE DAILY Koberlein, Junell C, MD Taking Active Self  pantoprazole (PROTONIX) 40 MG tablet [id number] Yes Take 1 tablet (40 mg total) by mouth daily. Hosie Poisson, MD Taking Active   simvastatin (ZOCOR) 20 MG tablet [id number] Yes  Take 1 tablet (20 mg total) by mouth at bedtime. [name], MD Taking Active  vitamin B-12 (CYANOCOBALAMIN) 1000 MCG tablet [id number] Yes Take 1 tablet (1,000 mcg total) by mouth daily. [name], MD Taking Active   vitamin C (ASCORBIC ACID) 500 MG tablet [id number] Yes Take 500 mg by mouth daily.  [provider] Taking Active Self  warfarin (COUMADIN) 5 MG tablet [id number] Yes TAKE 1/2 TO 1 (ONE-HALF TO ONE) TABLET BY MOUTH DAILY AS DIRECTED BY  COUMADIN  CLINIC Fay Records, MD Taking Active             Patient Active Problem List   Diagnosis Date Noted   Thrombocytopenia ([place]) 04/23/2020   Acute GI bleeding 04/22/2020   Coronary artery calcification seen on CT scan 06/24/2019   Hypertension 04/25/2019   Chronic kidney disease, stage 3a ([place]) 12/15/2018   Syncope 10/30/2018   Frequent falls 05/21/2018   Encounter for therapeutic drug monitoring 05/07/2018   Large bowel stricture ([place]) 12/15/2017   Chronic diastolic CHF (congestive heart failure) ([place]) 03/15/2017   Anticoagulated on warfarin    Pulmonary hypertension ([place]) 07/26/2016   Neuropathy 02/12/2015   History of pulmonary embolism 06/10/2013   Hypothyroid 06/06/2013   Diabetes mellitus with neurological manifestations, controlled ([place]) 06/06/2013   Hyperlipidemia 02/25/2013   DM2 (diabetes mellitus, type 2) ([place]) 07/23/2012    Immunization History  Administered Date(s) Administered   Fluad Quad(high Dose 65+) 09/12/2018, 10/30/2019   Influenza Split 10/03/2012   Influenza Whole 10/04/2011   Influenza, High Dose Seasonal PF 11/12/2014, 10/21/2015, 10/20/2016, 09/19/2017   Influenza,inj,Quad PF,6+ Mos 11/07/2013   Influenza-Unspecified 10/03/2016   PFIZER(Purple Top)SARS-COV-2 Vaccination 02/26/2019, 03/09/2019, 12/20/2019   Pneumococcal Conjugate-13 04/07/2014, 10/20/2016   Pneumococcal-Unspecified 10/04/2011   Tdap 04/07/2014   Zoster, Live 07/23/2015   Patient didn't feel good the  other day and didn't go to the [employer] as planned. Patients legs were bothering her with the weather changes.  Conditions to be addressed/monitored:  Hypertension, Hyperlipidemia, Diabetes, Heart Failure, Chronic Kidney Disease, Hypothyroidism, Osteoarthritis, and GI bleed and neuropathy  Conditions addressed this visit: Hypertension, diabetes  Care Plan : CCM Pharmacy Care Plan  Updates made by [name], [place] since 11/09/2020 12:00 AM     Problem: Problem: Hypertension, Hyperlipidemia, Diabetes, Heart Failure, Chronic Kidney Disease, Hypothyroidism, Osteoarthritis, and GI bleed and neuropathy      Long-Range Goal: Patient-Specific Goal   Start Date: 06/18/2020  Expected End Date: 06/18/2021  Recent Progress: On track  Priority: High  Note:   Current Barriers:  Unable to achieve control of cholesterol  Unable to maintain control of blood pressure  Pharmacist Clinical Goal(s):  Patient will achieve control of cholesterol as evidenced by next lipid panel maintain control of blood pressure as evidenced by home blood pressure readings  through collaboration with PharmD and provider.   Interventions: 1:1 collaboration with [name], MD regarding development and update of comprehensive plan of care as evidenced by provider attestation and co-signature Inter-disciplinary care team collaboration (see longitudinal plan of care) Comprehensive medication review performed; medication list updated in electronic medical record  Hypertension (BP goal <140/90) -Uncontrolled -Current treatment: Metoprolol tartrate 25 mg 1 tablet twice daily -Medications previously tried: amlodipine, lisinopril  -Current home readings: 144/89 HR 62, 146/80 HR 67 (last Friday), 130/73 HR 63, 138/72 HR 63, 133/74 HR 63, 155/92 HR 61, 147/80  -Current dietary habits: limits salt intake; daughters bring her supper every day; eats out sometimes -Current exercise habits: PT exercises every day  (doesn't go down the steps every day -Denies hypotensive/hypertensive symptoms -  Educated on Daily salt intake goal < 2300 mg; Importance of home blood pressure monitoring; Proper BP monitoring technique; Symptoms of hypotension and importance of maintaining adequate hydration; -Counseled to monitor BP at home daily, document, and provide log at future appointments -Counseled on diet and exercise extensively Recommended to continue current medication Recommended restarting lisinopril.  Hyperlipidemia: (LDL goal < 70) -Uncontrolled -Current treatment: Simvastatin 20 mg 1 tablet at bedtime -Medications previously tried: rosuvastatin (side effects) -Current dietary patterns: eats a lot of fish and only some red meat; tries to boil instead of frying and tries not to cook with oil but does use vegetable oil - recommended olive or canola -Current exercise habits: PT exercises every day -Educated on Cholesterol goals;  Benefits of statin for ASCVD risk reduction; Importance of limiting foods high in cholesterol; -Counseled on diet and exercise extensively Reconsider escalating at follow up.  Diabetes (A1c goal <7%) -Controlled -Current medications: Metformin XR 500 mg 1 tablet once daily with breakfast -Medications previously tried: n/a  -Current home glucose readings fasting glucose: 122, 94, 103, 132, 130 post prandial glucose: does not check -Denies hypoglycemic/hyperglycemic symptoms -Current meal patterns:  breakfast: did not discuss lunch: did not discuss  dinner: did not discuss snacks: did not discuss drinks: did not discuss -Current exercise: PT exercises every day -Educated on A1c and blood sugar goals; Prevention and management of hypoglycemic episodes; Benefits of routine self-monitoring of blood sugar; Carbohydrate counting and/or plate method -Counseled to check feet daily and get yearly eye exams -Counseled on diet and exercise extensively Recommended to continue  current medication Recommended for patient to contact eye doctor to get report from last eye exam  Heart Failure (Goal: manage symptoms and prevent exacerbations) -Controlled -Last ejection fraction: 45-50% (Date: 04/23/20) -HF type: Diastolic -NYHA Class: II (slight limitation of activity) -AHA HF Stage: B (Heart disease present - no symptoms present) -Current treatment: Furosemide 20 mg daily as needed Metoprolol tartrate 25 mg 1 tablet twice daily -Medications previously tried: none  -Current home BP/HR readings: refer to above -Current dietary habits: limiting salt intake -Current exercise habits: PT exercises -Educated on Benefits of medications for managing symptoms and prolonging life Importance of weighing daily; if you gain more than 3 pounds in one day or 5 pounds in one week, call cardiologist Importance of blood pressure control -Counseled on diet and exercise extensively Recommended to continue current medication Patient is only taking furosemide not even once a week  Hypothyroidism (Goal: TSH  0.35-4.5) -Controlled -Current treatment  Euthyrox 25 mcg 1 tablet before breakfast -Medications previously tried: none  -Recommended to continue current medication Counseled on importance of taking this medication on an empty stomach separate from all multivitamins and supplements.  Stomach protection (Goal: protect stomach after GI bleed) -Controlled -Current treatment  Pantoprazole 40 mg 1 tablet daily -Medications previously tried: none  - Plan to reassess need at follow up.  Osteoarthritis (Goal: minimize pain) -Controlled -Current treatment  Voltaren gel 1% apply as needed Aspercreme Tylenol 500 mg 1 tablet as needed -Medications previously tried: none  -Recommended to continue current medication  History of PE (Goal: prevent blood clots) -Controlled -Current treatment  Warfarin 5 mg as directed by coumadin clinic -Medications previously tried: none   -Recommended to continue current medication Counseled on maintaining consistent intake of greens from week to week   Health Maintenance -Vaccine gaps: shingrix, COVID booster, influenza, pneumovax -Current therapy:  Vitamin C 500 mg daily Vitamin B12 1000 mcg daily -Educated on Cost vs  benefit of each product must be carefully weighed by individual consumer -Patient is satisfied with current therapy and denies issues -Recommended to continue current medication  Patient Goals/Self-Care Activities Patient will:  - take medications as prescribed check glucose daily, document, and provide at future appointments check blood pressure daily, document, and provide at future appointments weigh daily, and contact provider if weight gain of > 3 lbs in one day or > 5 lbs in one week  Follow Up Plan: Telephone follow up appointment with care management team member scheduled for: 4 months      Medication Assistance: None required.  Patient affirms current coverage meets needs.  Compliance/Adherence/Medication fill history: Care Gaps: Shingrix, eye exam, urine microalbumin, COVID booster  Star-Rating Drugs: Simvastatin 20 mg - last filled 10/16/20 for 90 ds at [facility] Metformin 500 mg - last filled 10/26/20 for 90 ds [facility]  Patient's preferred pharmacy is:  [facility] ([place] [place] - [address] [place] ([place]) [place] [address] Phone: [phone] Fax: [phone]  Uses pill box? Yes Pt endorses 99% compliance  We discussed: Benefits of medication synchronization, packaging and delivery as well as enhanced pharmacist oversight with [employer]. Patient decided to: Continue current medication management strategy  Care Plan and Follow Up Patient Decision:  Patient agrees to Care Plan and Follow-up.  Plan: Telephone follow up appointment with care management team member scheduled for:  4 months  Jeni Salles, PharmD,  [place] Pharmacist [place] [facility] [place] [phone]

## 2020-11-09 NOTE — Patient Instructions (Signed)
Hi Obie,  It was great to catch up with you on the telephone! I will let you know what Dr. Hassan Rowan says once I hear back.  Please reach out to me if you have any questions or need anything before our follow up!  Best, Maddie  Gaylord Shih, PharmD, [facility] Clinical Pharmacist [facility] [phone]   Visit Information   Goals Addressed   None    Patient Care Plan: CCM Pharmacy Care Plan     Problem Identified: Problem: Hypertension, Hyperlipidemia, Diabetes, Heart Failure, Chronic Kidney Disease, Hypothyroidism, Osteoarthritis, and GI bleed and neuropathy      Long-Range Goal: Patient-Specific Goal   Start Date: 06/18/2020  Expected End Date: 06/18/2021  Recent Progress: On track  Priority: High  Note:   Current Barriers:  Unable to achieve control of cholesterol  Unable to maintain control of blood pressure  Pharmacist Clinical Goal(s):  Patient will achieve control of cholesterol as evidenced by next lipid panel maintain control of blood pressure as evidenced by home blood pressure readings  through collaboration with PharmD and provider.   Interventions: 1:1 collaboration with Wynn Banker, MD regarding development and update of comprehensive plan of care as evidenced by provider attestation and co-signature Inter-disciplinary care team collaboration (see longitudinal plan of care) Comprehensive medication review performed; medication list updated in electronic medical record  Hypertension (BP goal <140/90) -Uncontrolled -Current treatment: Metoprolol tartrate 25 mg 1 tablet twice daily -Medications previously tried: amlodipine, lisinopril  -Current home readings: 144/89 HR 62, 146/80 HR 67 (last Friday), 130/73 HR 63, 138/72 HR 63, 133/74 HR 63, 155/92 HR 61, 147/80  -Current dietary habits: limits salt intake; daughters bring her supper every day; eats out sometimes -Current exercise habits: PT exercises every day (doesn't go down  the steps every day -Denies hypotensive/hypertensive symptoms -Educated on Daily salt intake goal < 2300 mg; Importance of home blood pressure monitoring; Proper BP monitoring technique; Symptoms of hypotension and importance of maintaining adequate hydration; -Counseled to monitor BP at home daily, document, and provide log at future appointments -Counseled on diet and exercise extensively Recommended to continue current medication Recommended restarting lisinopril.  Hyperlipidemia: (LDL goal < 70) -Uncontrolled -Current treatment: Simvastatin 20 mg 1 tablet at bedtime -Medications previously tried: rosuvastatin (side effects) -Current dietary patterns: eats a lot of fish and only some red meat; tries to boil instead of frying and tries not to cook with oil but does use vegetable oil - recommended olive or canola -Current exercise habits: PT exercises every day -Educated on Cholesterol goals;  Benefits of statin for ASCVD risk reduction; Importance of limiting foods high in cholesterol; -Counseled on diet and exercise extensively Reconsider escalating at follow up.  Diabetes (A1c goal <7%) -Controlled -Current medications: Metformin XR 500 mg 1 tablet once daily with breakfast -Medications previously tried: n/a  -Current home glucose readings fasting glucose: 122, 94, 103, 132, 130 post prandial glucose: does not check -Denies hypoglycemic/hyperglycemic symptoms -Current meal patterns:  breakfast: did not discuss lunch: did not discuss  dinner: did not discuss snacks: did not discuss drinks: did not discuss -Current exercise: PT exercises every day -Educated on A1c and blood sugar goals; Prevention and management of hypoglycemic episodes; Benefits of routine self-monitoring of blood sugar; Carbohydrate counting and/or plate method -Counseled to check feet daily and get yearly eye exams -Counseled on diet and exercise extensively Recommended to continue current  medication Recommended for patient to contact eye doctor to get report from last eye exam  Heart Failure (Goal: manage symptoms and prevent exacerbations) -Controlled -Last ejection fraction: 45-50% (Date: 04/23/20) -HF type: Diastolic -NYHA Class: II (slight limitation of activity) -AHA HF Stage: B (Heart disease present - no symptoms present) -Current treatment: Furosemide 20 mg daily as needed Metoprolol tartrate 25 mg 1 tablet twice daily -Medications previously tried: none  -Current home BP/HR readings: refer to above -Current dietary habits: limiting salt intake -Current exercise habits: PT exercises -Educated on Benefits of medications for managing symptoms and prolonging life Importance of weighing daily; if you gain more than 3 pounds in one day or 5 pounds in one week, call cardiologist Importance of blood pressure control -Counseled on diet and exercise extensively Recommended to continue current medication Patient is only taking furosemide not even once a week  Hypothyroidism (Goal: TSH  0.35-4.5) -Controlled -Current treatment  Euthyrox 25 mcg 1 tablet before breakfast -Medications previously tried: none  -Recommended to continue current medication Counseled on importance of taking this medication on an empty stomach separate from all multivitamins and supplements.  Stomach protection (Goal: protect stomach after GI bleed) -Controlled -Current treatment  Pantoprazole 40 mg 1 tablet daily -Medications previously tried: none  - Plan to reassess need at follow up.  Osteoarthritis (Goal: minimize pain) -Controlled -Current treatment  Voltaren gel 1% apply as needed Aspercreme Tylenol 500 mg 1 tablet as needed -Medications previously tried: none  -Recommended to continue current medication  History of PE (Goal: prevent blood clots) -Controlled -Current treatment  Warfarin 5 mg as directed by coumadin clinic -Medications previously tried: none  -Recommended  to continue current medication Counseled on maintaining consistent intake of greens from week to week   Health Maintenance -Vaccine gaps: shingrix, COVID booster, influenza, pneumovax -Current therapy:  Vitamin C 500 mg daily Vitamin B12 1000 mcg daily -Educated on Cost vs benefit of each product must be carefully weighed by individual consumer -Patient is satisfied with current therapy and denies issues -Recommended to continue current medication  Patient Goals/Self-Care Activities Patient will:  - take medications as prescribed check glucose daily, document, and provide at future appointments check blood pressure daily, document, and provide at future appointments weigh daily, and contact provider if weight gain of > 3 lbs in one day or > 5 lbs in one week  Follow Up Plan: Telephone follow up appointment with care management team member scheduled for: 4 months       Patient verbalizes understanding of instructions provided today and agrees to view in [place].  The pharmacy team will reach out to the patient again over the next 30 days.   [name], [facility]

## 2020-11-23 ENCOUNTER — Ambulatory Visit: Payer: Medicare Other

## 2020-11-23 ENCOUNTER — Telehealth: Payer: Self-pay | Admitting: Pharmacist

## 2020-11-23 ENCOUNTER — Other Ambulatory Visit: Payer: Self-pay

## 2020-11-23 DIAGNOSIS — I2699 Other pulmonary embolism without acute cor pulmonale: Secondary | ICD-10-CM | POA: Diagnosis not present

## 2020-11-23 DIAGNOSIS — Z5181 Encounter for therapeutic drug level monitoring: Secondary | ICD-10-CM

## 2020-11-23 DIAGNOSIS — Z7901 Long term (current) use of anticoagulants: Secondary | ICD-10-CM

## 2020-11-23 LAB — POCT INR: INR: 2.2 (ref 2.0–3.0)

## 2020-11-23 NOTE — Patient Instructions (Signed)
-   take extra 1/2 tablet tonight, then - Continue taking warfarin 1/2 tablet daily except for 1 tablet on Mondays, Wednesdays and Fridays. Keep a green leafy veggie in your diet 2-3 times a week.  - Recheck INR in 3 weeks. Coumadin Clinic [phone].

## 2020-11-23 NOTE — Telephone Encounter (Signed)
Called patient to inquire if she has any blood pressure medications left over from previously stopping them. Patient no longer has any lisinopril at home.   Will route to PCP to make her aware as per discussion to restart with low dose lisinopril.

## 2020-11-24 ENCOUNTER — Other Ambulatory Visit: Payer: Self-pay | Admitting: Family Medicine

## 2020-11-24 MED ORDER — LISINOPRIL 5 MG PO TABS: 5.0000 mg | ORAL_TABLET | Freq: Every day | ORAL | 1 refills | Status: DC

## 2020-11-24 NOTE — Telephone Encounter (Signed)
Called patient to let her know the lisinopril 5 mg has been sent in. Plan for BP assessment in 3-4 weeks to see how her numbers are doing.

## 2020-11-24 NOTE — Telephone Encounter (Signed)
Lisinopril 5mg  has been sent in

## 2020-11-27 DIAGNOSIS — Z4801 Encounter for change or removal of surgical wound dressing: Secondary | ICD-10-CM | POA: Diagnosis not present

## 2020-11-27 DIAGNOSIS — E119 Type 2 diabetes mellitus without complications: Secondary | ICD-10-CM | POA: Diagnosis not present

## 2020-11-27 DIAGNOSIS — Z933 Colostomy status: Secondary | ICD-10-CM | POA: Diagnosis not present

## 2020-11-27 DIAGNOSIS — K56609 Unspecified intestinal obstruction, unspecified as to partial versus complete obstruction: Secondary | ICD-10-CM | POA: Diagnosis not present

## 2020-12-02 DIAGNOSIS — E1140 Type 2 diabetes mellitus with diabetic neuropathy, unspecified: Secondary | ICD-10-CM

## 2020-12-02 DIAGNOSIS — I10 Essential (primary) hypertension: Secondary | ICD-10-CM | POA: Diagnosis not present

## 2020-12-14 ENCOUNTER — Other Ambulatory Visit: Payer: Self-pay

## 2020-12-14 ENCOUNTER — Ambulatory Visit: Payer: Medicare Other | Admitting: *Deleted

## 2020-12-14 DIAGNOSIS — I2699 Other pulmonary embolism without acute cor pulmonale: Secondary | ICD-10-CM | POA: Diagnosis not present

## 2020-12-14 DIAGNOSIS — Z5181 Encounter for therapeutic drug level monitoring: Secondary | ICD-10-CM

## 2020-12-14 DIAGNOSIS — Z7901 Long term (current) use of anticoagulants: Secondary | ICD-10-CM

## 2020-12-14 LAB — POCT INR: INR: 3 (ref 2.0–3.0)

## 2020-12-14 NOTE — Patient Instructions (Signed)
Description   Continue taking warfarin 1/2 tablet daily except for 1 tablet on Mondays, Wednesdays and Fridays. Keep a green leafy veggie in your diet 2-3 times a week. Recheck INR in 4 weeks. Coumadin Clinic [phone].

## 2020-12-22 ENCOUNTER — Telehealth: Payer: Self-pay | Admitting: Pharmacist

## 2020-12-22 NOTE — Chronic Care Management (AMB) (Signed)
Chronic Care Management Pharmacy Assistant   Name: Hannah [name]  MRN: [id number] DOB: 21-Oct-1940  Reason for Encounter: Disease State / Hypertension Assessment Call   Conditions to be addressed/monitored: HTN   Recent office visits:  None  Recent consult visits:  None  Hospital visits:  None  Medications: Outpatient Encounter Medications as of 12/22/2020  Medication Sig   acetaminophen (TYLENOL) 500 MG tablet Take 500 mg by mouth every 6 (six) hours as needed for mild pain.    Blood Glucose Monitoring Suppl (ONETOUCH VERIO) w/Device KIT 1 each by Does not apply route as directed.   diclofenac Sodium (VOLTAREN) 1 % GEL Apply 4 g topically 4 (four) times daily. (Patient taking differently: Apply 4 g topically daily as needed.)   furosemide (LASIX) 20 MG tablet Take 1 tablet (20 mg total) by mouth daily as needed for fluid or edema.   glucose blood (ONETOUCH VERIO) test strip USE 1 STRIP TO CHECK GLUCOSE ONCE DAILY AS DIRECTED   levothyroxine (EUTHYROX) 25 MCG tablet Take 1 tablet (25 mcg total) by mouth daily before breakfast.   lisinopril (ZESTRIL) 5 MG tablet Take 1 tablet (5 mg total) by mouth daily.   metFORMIN (GLUCOPHAGE-XR) 500 MG 24 hr tablet TAKE 1 TABLET BY MOUTH ONCE DAILY WITH BREAKFAST. PATIENT NEEDS AN APPT.   metoprolol tartrate (LOPRESSOR) 25 MG tablet Take 1 tablet (25 mg total) by mouth 2 (two) times daily.   OneTouch Delica Lancets 34H MISC USE 1  TO CHECK GLUCOSE ONCE DAILY   pantoprazole (PROTONIX) 40 MG tablet Take 1 tablet (40 mg total) by mouth daily.   simvastatin (ZOCOR) 20 MG tablet Take 1 tablet (20 mg total) by mouth at bedtime.   vitamin B-12 (CYANOCOBALAMIN) 1000 MCG tablet Take 1 tablet (1,000 mcg total) by mouth daily.   vitamin C (ASCORBIC ACID) 500 MG tablet Take 500 mg by mouth daily.    warfarin (COUMADIN) 5 MG tablet TAKE 1/2 TO 1 (ONE-HALF TO ONE) TABLET BY MOUTH DAILY AS DIRECTED BY  COUMADIN  CLINIC   No facility-administered encounter  medications on file as of 12/22/2020.  Fill History: EUTHYROX 25MCG      TAB 11/25/2020 90   LISINOPRIL 5MG      TAB 11/24/2020 90   METOPROLOL TARTRATE 25MG TAB 11/20/2020 90   SIMVASTATIN 20MG  TAB 10/26/2020 90   WARFARIN 5MG        TAB 10/26/2020 80   METFORMIN ER 500MG  TAB 10/16/2020 90  Reviewed chart prior to disease state call. Spoke with patient regarding BP  Recent Office Vitals: BP Readings from Last 3 Encounters:  08/19/20 (!) 142/100  05/15/20 128/74  04/26/20 (!) 125/57   Pulse Readings from Last 3 Encounters:  08/19/20 (!) 58  05/15/20 73  04/26/20 87    Wt Readings from Last 3 Encounters:  08/19/20 172 lb 6.4 oz (78.2 kg)  05/15/20 184 lb (83.5 kg)  04/26/20 185 lb 3 oz (84 kg)     Kidney Function Lab Results  Component Value Date/Time   CREATININE 1.01 08/19/2020 03:16 PM   CREATININE 1.32 (H) 04/25/2020 12:42 AM   CREATININE 1.32 (H) 12/03/2019 01:23 PM   CREATININE 1.31 (H) 10/30/2019 11:09 AM   GFR 53.04 (L) 08/19/2020 03:16 PM   GFRNONAA 41 (L) 04/25/2020 12:42 AM   GFRNONAA 39 (L) 12/03/2019 01:23 PM   GFRAA 45 (L) 12/03/2019 01:23 PM    BMP Latest Ref Rng & Units 08/19/2020 04/25/2020 04/24/2020  Glucose  70 - 99 mg/dL 98 84 115(H)  BUN 6 - 23 mg/dL _0 Creatinine 0.40 - 1.20 mg/dL 1.01 1.32(H) 1.40(H)  BUN/Creat Ratio 6 - 22 (calc) - - -  Sodium 135 - 145 mEq/L 141 138 139  Potassium 3.5 - 5.1 mEq/L 4.1 3.8 4.2  Chloride 96 - 112 mEq/L 105 111 111  CO2 19 - 32 mEq/L 24 21(L) 22  Calcium 8.4 - 10.5 mg/dL 9.5 8.2(L) 8.4(L)    Current antihypertensive regimen:  Lisinopril 5 mg daily Metoprolol 25 mg twice daily  How often are you checking your Blood Pressure? daily  Current home BP readings, patient was recently started on Lisinopril 5 mg daily and how is she tolerating the medication?: Patient states she is tolerating Lisinopril fine, no issues. Patient has been checking blood pressures, recent readings are 12/17/18 - 140/70,  12/18/20 - 138/72, 12/19/20 - 162/96, 12/20/20 - 104/64, 12/21/20 - 149/90.  What recent interventions/DTPs have been made by any provider to improve Blood Pressure control since last CPP Visit: Patient has recently started on Lisinopril 5 mg daily.  Any recent hospitalizations or ED visits since last visit with CPP? No recent hospital visits.  What diet changes have been made to improve Blood Pressure Control?  Patients daughters cook for her and bring her dinner every evening.  She will have cereal or oatmeal for breakfast.   What exercise is being done to improve your Blood Pressure Control?  Patient doesn't move around a lot states it is hard for her to get around.  Adherence Review: Is the patient currently on ACE/ARB medication? Yes Does the patient have >5 day gap between last estimated fill dates? No  Care Gaps: AWV - scheduled for 03/16/21 Zoster vaccines - never done Pneumonia vaccine - overdue  Ophthalmology exam - overdue Covid-19 vaccine  - overdue  Flu vaccine - due Foot exam - overdue  Last PCP BP: 142/100 P: 58 on 08/19/20  Star Rating Drugs: Metformin 542m - last filled on 10/16/20 90DS at [facility] Simvastatin 239m- last filled on 10/26/20 90DS at [facility] Lisinopril 5 mg  - last filled 11/24/2020 [facility] [phone]

## 2020-12-25 ENCOUNTER — Encounter (HOSPITAL_COMMUNITY): Payer: Self-pay

## 2020-12-25 ENCOUNTER — Inpatient Hospital Stay (HOSPITAL_COMMUNITY)
Admission: EM | Admit: 2020-12-25 | Discharge: 2021-01-04 | DRG: 177 | Disposition: A | Discharge status: Home Health | Payer: Medicare Other | Attending: Internal Medicine | Admitting: Internal Medicine

## 2020-12-25 ENCOUNTER — Emergency Department (HOSPITAL_COMMUNITY): Payer: Medicare Other

## 2020-12-25 DIAGNOSIS — Z7984 Long term (current) use of oral hypoglycemic drugs: Secondary | ICD-10-CM

## 2020-12-25 DIAGNOSIS — Z87891 Personal history of nicotine dependence: Secondary | ICD-10-CM | POA: Diagnosis not present

## 2020-12-25 DIAGNOSIS — I5032 Chronic diastolic (congestive) heart failure: Secondary | ICD-10-CM | POA: Diagnosis not present

## 2020-12-25 DIAGNOSIS — Z743 Need for continuous supervision: Secondary | ICD-10-CM | POA: Diagnosis not present

## 2020-12-25 DIAGNOSIS — I319 Disease of pericardium, unspecified: Secondary | ICD-10-CM | POA: Diagnosis not present

## 2020-12-25 DIAGNOSIS — N1831 Chronic kidney disease, stage 3a: Secondary | ICD-10-CM | POA: Diagnosis not present

## 2020-12-25 DIAGNOSIS — Z86711 Personal history of pulmonary embolism: Secondary | ICD-10-CM | POA: Diagnosis present

## 2020-12-25 DIAGNOSIS — I130 Hypertensive heart and chronic kidney disease with heart failure and stage 1 through stage 4 chronic kidney disease, or unspecified chronic kidney disease: Secondary | ICD-10-CM | POA: Diagnosis not present

## 2020-12-25 DIAGNOSIS — I444 Left anterior fascicular block: Secondary | ICD-10-CM | POA: Diagnosis not present

## 2020-12-25 DIAGNOSIS — E1142 Type 2 diabetes mellitus with diabetic polyneuropathy: Secondary | ICD-10-CM | POA: Diagnosis present

## 2020-12-25 DIAGNOSIS — E1140 Type 2 diabetes mellitus with diabetic neuropathy, unspecified: Secondary | ICD-10-CM

## 2020-12-25 DIAGNOSIS — I5022 Chronic systolic (congestive) heart failure: Secondary | ICD-10-CM | POA: Diagnosis present

## 2020-12-25 DIAGNOSIS — I480 Paroxysmal atrial fibrillation: Secondary | ICD-10-CM

## 2020-12-25 DIAGNOSIS — Z9049 Acquired absence of other specified parts of digestive tract: Secondary | ICD-10-CM | POA: Diagnosis not present

## 2020-12-25 DIAGNOSIS — I4892 Unspecified atrial flutter: Secondary | ICD-10-CM | POA: Diagnosis not present

## 2020-12-25 DIAGNOSIS — E119 Type 2 diabetes mellitus without complications: Secondary | ICD-10-CM

## 2020-12-25 DIAGNOSIS — R0789 Other chest pain: Secondary | ICD-10-CM | POA: Diagnosis not present

## 2020-12-25 DIAGNOSIS — I3139 Other pericardial effusion (noninflammatory): Secondary | ICD-10-CM | POA: Diagnosis not present

## 2020-12-25 DIAGNOSIS — I420 Dilated cardiomyopathy: Secondary | ICD-10-CM | POA: Diagnosis not present

## 2020-12-25 DIAGNOSIS — I10 Essential (primary) hypertension: Secondary | ICD-10-CM | POA: Diagnosis present

## 2020-12-25 DIAGNOSIS — I272 Pulmonary hypertension, unspecified: Secondary | ICD-10-CM | POA: Diagnosis not present

## 2020-12-25 DIAGNOSIS — Z8674 Personal history of sudden cardiac arrest: Secondary | ICD-10-CM

## 2020-12-25 DIAGNOSIS — U071 COVID-19: Secondary | ICD-10-CM

## 2020-12-25 DIAGNOSIS — Z888 Allergy status to other drugs, medicaments and biological substances status: Secondary | ICD-10-CM | POA: Diagnosis not present

## 2020-12-25 DIAGNOSIS — Z933 Colostomy status: Secondary | ICD-10-CM | POA: Diagnosis not present

## 2020-12-25 DIAGNOSIS — I214 Non-ST elevation (NSTEMI) myocardial infarction: Secondary | ICD-10-CM

## 2020-12-25 DIAGNOSIS — R059 Cough, unspecified: Secondary | ICD-10-CM | POA: Diagnosis not present

## 2020-12-25 DIAGNOSIS — Z880 Allergy status to penicillin: Secondary | ICD-10-CM | POA: Diagnosis not present

## 2020-12-25 DIAGNOSIS — R072 Precordial pain: Secondary | ICD-10-CM | POA: Diagnosis not present

## 2020-12-25 DIAGNOSIS — Z7901 Long term (current) use of anticoagulants: Secondary | ICD-10-CM

## 2020-12-25 DIAGNOSIS — E7849 Other hyperlipidemia: Secondary | ICD-10-CM | POA: Diagnosis not present

## 2020-12-25 DIAGNOSIS — I38 Endocarditis, valve unspecified: Secondary | ICD-10-CM | POA: Diagnosis not present

## 2020-12-25 DIAGNOSIS — R7989 Other specified abnormal findings of blood chemistry: Secondary | ICD-10-CM

## 2020-12-25 DIAGNOSIS — Z86718 Personal history of other venous thrombosis and embolism: Secondary | ICD-10-CM

## 2020-12-25 DIAGNOSIS — I252 Old myocardial infarction: Secondary | ICD-10-CM

## 2020-12-25 DIAGNOSIS — R778 Other specified abnormalities of plasma proteins: Secondary | ICD-10-CM | POA: Diagnosis not present

## 2020-12-25 DIAGNOSIS — I255 Ischemic cardiomyopathy: Secondary | ICD-10-CM | POA: Diagnosis not present

## 2020-12-25 DIAGNOSIS — Z8249 Family history of ischemic heart disease and other diseases of the circulatory system: Secondary | ICD-10-CM

## 2020-12-25 DIAGNOSIS — Z79899 Other long term (current) drug therapy: Secondary | ICD-10-CM

## 2020-12-25 DIAGNOSIS — R6889 Other general symptoms and signs: Secondary | ICD-10-CM | POA: Diagnosis not present

## 2020-12-25 DIAGNOSIS — R0602 Shortness of breath: Secondary | ICD-10-CM

## 2020-12-25 DIAGNOSIS — R110 Nausea: Secondary | ICD-10-CM | POA: Diagnosis not present

## 2020-12-25 DIAGNOSIS — E1122 Type 2 diabetes mellitus with diabetic chronic kidney disease: Secondary | ICD-10-CM | POA: Diagnosis not present

## 2020-12-25 DIAGNOSIS — I5020 Unspecified systolic (congestive) heart failure: Secondary | ICD-10-CM | POA: Diagnosis present

## 2020-12-25 DIAGNOSIS — I5021 Acute systolic (congestive) heart failure: Secondary | ICD-10-CM | POA: Diagnosis not present

## 2020-12-25 DIAGNOSIS — E039 Hypothyroidism, unspecified: Secondary | ICD-10-CM | POA: Diagnosis not present

## 2020-12-25 DIAGNOSIS — R079 Chest pain, unspecified: Secondary | ICD-10-CM | POA: Diagnosis not present

## 2020-12-25 DIAGNOSIS — E785 Hyperlipidemia, unspecified: Secondary | ICD-10-CM | POA: Diagnosis present

## 2020-12-25 LAB — CBC WITH DIFFERENTIAL/PLATELET
Abs Immature Granulocytes: 0.05 10*3/uL (ref 0.00–0.07)
Basophils Absolute: 0 10*3/uL (ref 0.0–0.1)
Basophils Relative: 0 %
Eosinophils Absolute: 0.2 10*3/uL (ref 0.0–0.5)
Eosinophils Relative: 2 %
HCT: 44.6 % (ref 36.0–46.0)
Hemoglobin: 13.6 g/dL (ref 12.0–15.0)
Immature Granulocytes: 1 %
Lymphocytes Relative: 13 %
Lymphs Abs: 1.4 10*3/uL (ref 0.7–4.0)
MCH: 24.8 pg — ABNORMAL LOW (ref 26.0–34.0)
MCHC: 30.5 g/dL (ref 30.0–36.0)
MCV: 81.4 fL (ref 80.0–100.0)
Monocytes Absolute: 0.7 10*3/uL (ref 0.1–1.0)
Monocytes Relative: 6 %
Neutro Abs: 8.6 10*3/uL — ABNORMAL HIGH (ref 1.7–7.7)
Neutrophils Relative %: 78 %
Platelets: 294 10*3/uL (ref 150–400)
RBC: 5.48 MIL/uL — ABNORMAL HIGH (ref 3.87–5.11)
RDW: 20.5 % — ABNORMAL HIGH (ref 11.5–15.5)
WBC: 11 10*3/uL — ABNORMAL HIGH (ref 4.0–10.5)
nRBC: 0 % (ref 0.0–0.2)

## 2020-12-25 LAB — FIBRINOGEN: Fibrinogen: 577 mg/dL — ABNORMAL HIGH (ref 210–475)

## 2020-12-25 LAB — BASIC METABOLIC PANEL
Anion gap: 9 (ref 5–15)
BUN: 23 mg/dL (ref 8–23)
CO2: 23 mmol/L (ref 22–32)
Calcium: 9 mg/dL (ref 8.9–10.3)
Chloride: 103 mmol/L (ref 98–111)
Creatinine, Ser: 1.32 mg/dL — ABNORMAL HIGH (ref 0.44–1.00)
GFR, Estimated: 41 mL/min — ABNORMAL LOW (ref >60)
Glucose, Bld: 119 mg/dL — ABNORMAL HIGH (ref 70–99)
Potassium: 5.2 mmol/L — ABNORMAL HIGH (ref 3.5–5.1)
Sodium: 135 mmol/L (ref 135–145)

## 2020-12-25 LAB — BRAIN NATRIURETIC PEPTIDE: B Natriuretic Peptide: 137.4 pg/mL — ABNORMAL HIGH (ref 0.0–100.0)

## 2020-12-25 LAB — C-REACTIVE PROTEIN: CRP: 3 mg/dL — ABNORMAL HIGH (ref <1.0)

## 2020-12-25 LAB — D-DIMER, QUANTITATIVE: D-Dimer, Quant: 0.45 ug/mL-FEU (ref 0.00–0.50)

## 2020-12-25 LAB — CBG MONITORING, ED: Glucose-Capillary: 138 mg/dL — ABNORMAL HIGH (ref 70–99)

## 2020-12-25 LAB — PROTIME-INR
INR: 2.7 — ABNORMAL HIGH (ref 0.8–1.2)
Prothrombin Time: 28.5 seconds — ABNORMAL HIGH (ref 11.4–15.2)

## 2020-12-25 LAB — TROPONIN I (HIGH SENSITIVITY)
Troponin I (High Sensitivity): 8 ng/L (ref <18)
Troponin I (High Sensitivity): 77 ng/L — ABNORMAL HIGH (ref <18)
Troponin I (High Sensitivity): 1105 ng/L (ref <18)
Troponin I (High Sensitivity): 1222 ng/L (ref <18)

## 2020-12-25 LAB — RESP PANEL BY RT-PCR (FLU A&B, COVID) ARPGX2
Influenza A by PCR: NEGATIVE
Influenza B by PCR: NEGATIVE
SARS Coronavirus 2 by RT PCR: POSITIVE — AB

## 2020-12-25 LAB — FERRITIN: Ferritin: 16 ng/mL (ref 11–307)

## 2020-12-25 LAB — SEDIMENTATION RATE: Sed Rate: 17 mm/hr (ref 0–22)

## 2020-12-25 LAB — LACTATE DEHYDROGENASE: LDH: 178 U/L (ref 98–192)

## 2020-12-25 MED ORDER — LISINOPRIL 5 MG PO TABS
5.0000 mg | ORAL_TABLET | Freq: Every day | ORAL | Status: DC
  Administered 2020-12-26 – 2020-12-27 (×2): 5 mg via ORAL
  Filled 2020-12-25 (×2): qty 1

## 2020-12-25 MED ORDER — WARFARIN - PHARMACIST DOSING INPATIENT: Freq: Every day | Status: DC

## 2020-12-25 MED ORDER — WARFARIN SODIUM 5 MG PO TABS
5.0000 mg | ORAL_TABLET | Freq: Once | ORAL | Status: AC
  Administered 2020-12-25: 18:00:00 5 mg via ORAL
  Filled 2020-12-25: qty 1

## 2020-12-25 MED ORDER — ASPIRIN EC 81 MG PO TBEC
81.0000 mg | DELAYED_RELEASE_TABLET | Freq: Every day | ORAL | Status: DC
  Administered 2020-12-26 – 2021-01-03 (×8): 81 mg via ORAL
  Filled 2020-12-25 (×8): qty 1

## 2020-12-25 MED ORDER — PANTOPRAZOLE SODIUM 40 MG PO TBEC
40.0000 mg | DELAYED_RELEASE_TABLET | Freq: Every day | ORAL | Status: DC
  Administered 2020-12-26 – 2021-01-04 (×9): 40 mg via ORAL
  Filled 2020-12-25 (×9): qty 1

## 2020-12-25 MED ORDER — ONDANSETRON HCL 4 MG PO TABS
4.0000 mg | ORAL_TABLET | Freq: Four times a day (QID) | ORAL | Status: DC | PRN
  Administered 2021-01-04: 4 mg via ORAL
  Filled 2020-12-25: qty 1

## 2020-12-25 MED ORDER — ALBUTEROL SULFATE HFA 108 (90 BASE) MCG/ACT IN AERS
2.0000 | INHALATION_SPRAY | Freq: Four times a day (QID) | RESPIRATORY_TRACT | Status: DC | PRN
  Filled 2020-12-25: qty 6.7

## 2020-12-25 MED ORDER — INSULIN ASPART 100 UNIT/ML IJ SOLN
0.0000 [IU] | Freq: Three times a day (TID) | INTRAMUSCULAR | Status: DC
  Administered 2020-12-25 – 2021-01-03 (×3): 1 [IU] via SUBCUTANEOUS

## 2020-12-25 MED ORDER — SIMVASTATIN 20 MG PO TABS
20.0000 mg | ORAL_TABLET | Freq: Every day | ORAL | Status: DC
  Administered 2020-12-25 – 2020-12-27 (×3): 20 mg via ORAL
  Filled 2020-12-25 (×3): qty 1

## 2020-12-25 MED ORDER — ONDANSETRON HCL 4 MG/2ML IJ SOLN
4.0000 mg | Freq: Four times a day (QID) | INTRAMUSCULAR | Status: DC | PRN
  Administered 2020-12-27: 04:00:00 4 mg via INTRAVENOUS
  Filled 2020-12-25: qty 2

## 2020-12-25 MED ORDER — ASCORBIC ACID 500 MG PO TABS
500.0000 mg | ORAL_TABLET | Freq: Every day | ORAL | Status: DC
  Administered 2020-12-25 – 2021-01-04 (×10): 500 mg via ORAL
  Filled 2020-12-25 (×11): qty 1

## 2020-12-25 MED ORDER — SODIUM CHLORIDE 0.9 % IV SOLN: 250.0000 mL | INTRAVENOUS | Status: DC | PRN

## 2020-12-25 MED ORDER — GUAIFENESIN-DM 100-10 MG/5ML PO SYRP
10.0000 mL | ORAL_SOLUTION | ORAL | Status: DC | PRN
  Administered 2020-12-26: 16:00:00 10 mL via ORAL
  Filled 2020-12-25: qty 10

## 2020-12-25 MED ORDER — METOPROLOL TARTRATE 25 MG PO TABS
25.0000 mg | ORAL_TABLET | Freq: Two times a day (BID) | ORAL | Status: AC
  Administered 2020-12-25 – 2020-12-27 (×4): 25 mg via ORAL
  Filled 2020-12-25 (×5): qty 1

## 2020-12-25 MED ORDER — ZINC SULFATE 220 (50 ZN) MG PO CAPS
220.0000 mg | ORAL_CAPSULE | Freq: Every day | ORAL | Status: DC
  Administered 2020-12-25 – 2021-01-04 (×10): 220 mg via ORAL
  Filled 2020-12-25 (×10): qty 1

## 2020-12-25 MED ORDER — SODIUM CHLORIDE 0.9% FLUSH: 3.0000 mL | INTRAVENOUS | Status: DC | PRN

## 2020-12-25 MED ORDER — ASPIRIN 325 MG PO TABS
325.0000 mg | ORAL_TABLET | Freq: Once | ORAL | Status: AC
  Administered 2020-12-25: 23:00:00 325 mg via ORAL
  Filled 2020-12-25: qty 1

## 2020-12-25 MED ORDER — LEVOTHYROXINE SODIUM 25 MCG PO TABS
25.0000 ug | ORAL_TABLET | Freq: Every day | ORAL | Status: DC
  Administered 2020-12-26 – 2021-01-04 (×9): 25 ug via ORAL
  Filled 2020-12-25 (×9): qty 1

## 2020-12-25 MED ORDER — SODIUM CHLORIDE 0.9% FLUSH
3.0000 mL | Freq: Two times a day (BID) | INTRAVENOUS | Status: DC
  Administered 2020-12-25 – 2021-01-03 (×14): 3 mL via INTRAVENOUS

## 2020-12-25 NOTE — Assessment & Plan Note (Addendum)
80 year old female presenting with sudden onset chest pain and pressure with radiation to right arm that lasted a few seconds to minutes with continued pressure. Risks factors include recent covid infection, T2DM, HLD, HTN, FH and age.  -place in observation - In setting of covid, r/o pericarditis. Check echo/inflammatory markers -troponin 8>77, mild ST elevation.  Cardiology consulted. Continue to trend troponin, repeat ekg and place on telemetry

## 2020-12-25 NOTE — Assessment & Plan Note (Signed)
Continue zocor  Last lipid panel 08/2020, LDL 74

## 2020-12-25 NOTE — Consult Note (Signed)
Cardiology Consultation:   Patient ID: Hannah [name] MRN: [id number]; DOB: 19-May-1940  Admit date: 12/25/2020 Date of Consult: 12/25/2020  PCP:  [name], MD   [facility] Providers Cardiologist:  Dorris Carnes, MD        Patient Profile:   Hannah [name] is a 80 y.o. female with a hx of massive PE on lifelong Coumadin, chronic diastolic heart failure, coronary artery calcification seen on CT with negative Myoview in December 2020, hypertension, hyperlipidemia, DM2, hypothyroidism and history of large bowel stricture s/p colectomy with colostomy bag who is being seen 12/25/2020 for the evaluation of chest pain in the setting of COVID infection at the request of Dr. Rogers Blocker.  History of Present Illness:   [name] is a 80 year old female with past medical history of massive PE on lifelong Coumadin, chronic diastolic heart failure, coronary artery calcification seen on CT with negative Myoview in December 2020, hypertension, hyperlipidemia, DM2, hypothyroidism and history of large bowel stricture s/p colectomy with colostomy bag.  She had PEA arrest in July 2014 due to massive PE.  IVC filter was placed at that time.  Patient was admitted in December 2019 with left breast hematoma and cellulitis.  Hemoglobin dropped down to 8.8 at the time, INR supratherapeutic at 3.48.  Coumadin was held for 2 weeks.  She was admitted for syncope in December 2020, work-up including CTA, MRI and echocardiogram were reassuring.  Syncope was felt to be due to orthostatic hypotension and poor hydration.  Myoview obtained on 12/16/2018 showed EF 57%, no ischemia or infarction, overall low risk.  Patient was last seen by Delphina Cahill PA-C in the clinic on 06/25/2019, she was doing well at the time.   Patient presented to the hospital today after waking up around 3 AM with cough, chest pain and pressure.  Her daughter started coughing last Saturday.  On arrival, her temperature was 99.1.  O2 saturation 100% on  room air.  Blood pressure is elevated 186/105.  COVID test came back positive.  Potassium 5.2.  Serial troponin trended from 8 up to 77.  INR 2.7. Patient had both original and 2 boosters of vaccine. Chest x ray shows no acute finding. EKG showed sinus rhythm with diffuse mild ST elevation (no obvious PR depression). Cardiology consulted for chest pain.    Past Medical History:  Diagnosis Date   Acute massive pulmonary embolism ([place]) 07/13/2012   Massive PE w/ PEA arrest 07/13/12 >TNK >IVC filter >discharged on comadin     Anticoagulated on warfarin    Chronic diastolic CHF (congestive heart failure) ([place]) 03/15/2017   Chronic kidney disease, stage 3a ([place]) 12/15/2018   Diabetes mellitus, type 2 ([place]) 07/15/2012   with peripheral neuropathy   DM2 (diabetes mellitus, type 2) ([place]) 07/23/2012   Hyperlipemia 07/14/2012   Hyperlipidemia 02/25/2013   Hypertension 07/14/2012   Hypothyroid 06/06/2013   Large bowel stricture ([place])    s/p colectomy in 2016   Neuropathy 02/12/2015   Osteoarthritis    s/p hip and knee replacements   Pulmonary hypertension ([place]) 07/26/2016   Syncope 10/2018   Thyroid disease     Past Surgical History:  Procedure Laterality Date   COLONOSCOPY N/A 03/12/2016   Procedure: COLONOSCOPY;  Surgeon: Teena Irani, MD;  Location: [place];  Service: Endoscopy;  Laterality: N/A;   COLOSTOMY N/A 06/03/2014   Procedure: COLOSTOMY;  Surgeon: Erroll Luna, MD;  Location: [place];  Service: General;  Laterality: N/A;   FLEXIBLE SIGMOIDOSCOPY N/A 05/30/2014   Procedure:  FLEXIBLE SIGMOIDOSCOPY;  Surgeon: Carol Ada, MD;  [facility] END[facility]COPY;  Service: Endoscopy;  Laterality: N/A;   INSERTION OF VENA CAVA FILTER N/A 07/16/2012   Procedure: INSERTION OF VENA CAVA FILTER;  Surgeon: Elam Dutch, MD;  Location: [facility] CATH LAB;  Service: Cardiovascular;  Laterality: N/A;   KNEE ARTHROSCOPY     PARTIAL COLECTOMY N/A 06/03/2014   Procedure: PARTIAL COLECTOMY;  Surgeon: Erroll Luna, MD;   Location: [place];  Service: General;  Laterality: N/A;   REDUCTION MAMMAPLASTY Bilateral    SP ARTHRO HIP*L*       Home Medications:  Prior to Admission medications   Medication Sig Start Date End Date Taking? Authorizing Provider  acetaminophen (TYLENOL) 500 MG tablet Take 500 mg by mouth every 6 (six) hours as needed for mild pain.    Yes [provider]  diclofenac Sodium (VOLTAREN) 1 % GEL Apply 4 g topically 4 (four) times daily. Patient taking differently: Apply 4 g topically daily as needed. 06/26/19  Yes Lamptey, Myrene Galas, MD  furosemide (LASIX) 20 MG tablet Take 1 tablet (20 mg total) by mouth daily as needed for fluid or edema. 06/28/18  Yes Lucretia Kern, DO  levothyroxine (EUTHYROX) 25 MCG tablet Take 1 tablet (25 mcg total) by mouth daily before breakfast. 08/25/20  Yes Koberlein, Junell C, MD  lisinopril (ZESTRIL) 5 MG tablet Take 1 tablet (5 mg total) by mouth daily. 11/24/20  Yes Koberlein, Junell C, MD  metFORMIN (GLUCOPHAGE-XR) 500 MG 24 hr tablet TAKE 1 TABLET BY MOUTH ONCE DAILY WITH BREAKFAST. PATIENT NEEDS AN APPT. 10/16/20  Yes Koberlein, Steele Berg, MD  metoprolol tartrate (LOPRESSOR) 25 MG tablet Take 1 tablet (25 mg total) by mouth 2 (two) times daily. 08/25/20  Yes Koberlein, Junell C, MD  pantoprazole (PROTONIX) 40 MG tablet Take 1 tablet (40 mg total) by mouth daily. 04/26/20  Yes Hosie Poisson, MD  simvastatin (ZOCOR) 20 MG tablet Take 1 tablet (20 mg total) by mouth at bedtime. 07/23/20  Yes Koberlein, Steele Berg, MD  vitamin B-12 (CYANOCOBALAMIN) 1000 MCG tablet Take 1 tablet (1,000 mcg total) by mouth daily. 05/15/20  Yes Koberlein, Steele Berg, MD  vitamin C (ASCORBIC ACID) 500 MG tablet Take 500 mg by mouth daily.    Yes [provider]  warfarin (COUMADIN) 5 MG tablet TAKE 1/2 TO 1 (ONE-HALF TO ONE) TABLET BY MOUTH DAILY AS DIRECTED BY  COUMADIN  CLINIC Patient taking differently: Take 2.5-5 mg by mouth See admin instructions. Taking 1/2 tablet (2.5 mg)  Tues, thur, Sat & Sunday, and taking 1 tablet ( 77m) on Monday, Wed, & Friday. 08/10/20  Yes RFay Records MD  Blood Glucose Monitoring Suppl (ONETOUCH VERIO) w/Device KIT 1 each by Does not apply route as directed. 06/07/18   Koberlein, JAndris FlurryC, MD  glucose blood (ONETOUCH VERIO) test strip USE 1 STRIP TO CHECK GLUCOSE ONCE DAILY AS DIRECTED 08/14/20   KCaren Macadam MD  OneTouch Delica Lancets 398PMISC USE 1  TO CHECK GLUCOSE ONCE DAILY 07/02/18   KCaren Macadam MD    Inpatient Medications: Scheduled Meds:  vitamin C  500 mg Oral Daily   insulin aspart  0-9 Units Subcutaneous TID WC   sodium chloride flush  3 mL Intravenous Q12H   warfarin  5 mg Oral Once   [START ON 12/26/2020] Warfarin - Pharmacist Dosing Inpatient   Does not apply q1600   zinc sulfate  220 mg Oral Daily   Continuous Infusions:  sodium chloride     PRN Meds: sodium chloride, albuterol, guaiFENesin-dextromethorphan, ondansetron **OR** ondansetron (ZOFRAN) IV, sodium chloride flush  Allergies:    Allergies  Allergen Reactions   Augmentin [Amoxicillin-Pot Clavulanate] Itching and Other (See Comments)    Severe vaginal itching   Tranxene [Clorazepate] Itching   Crestor [Rosuvastatin]     Made her sick on stomach, she is able to tolerate the zocor   Penicillins Itching and Rash    Has patient had a PCN reaction causing immediate rash, facial/tongue/throat swelling, SOB or lightheadedness with hypotension: Yes Has patient had a PCN reaction causing severe rash involving mucus membranes or skin necrosis: No Has patient had a PCN reaction that required hospitalization: No Has patient had a PCN reaction occurring within the last 10 years: No If all of the above answers are "NO", then may proceed with Cephalosporin use.     Social History:   Social History   Socioeconomic History   Marital status: Widowed    Spouse name: Not on file   Number of children: Not on file   Years of education: Not on file    Highest education level: Not on file  Occupational History   Not on file  Tobacco Use   Smoking status: Former    Packs/day: 1.00    Years: 10.00    Pack years: 10.00    Types: Cigarettes    Quit date: 01/04/1968    Years since quitting: 53.0   Smokeless tobacco: Never  Vaping Use   Vaping Use: Never used  Substance and Sexual Activity   Alcohol use: No   Drug use: No   Sexual activity: Not on file  Other Topics Concern   Not on file  Social History Narrative   Lives alone.          Social Determinants of Health   Financial Resource Strain: Low Risk    Difficulty of Paying Living Expenses: Not hard at all  Food Insecurity: No Food Insecurity   Worried About Charity fundraiser in the Last Year: Never true   [place] in the Last Year: Never true  Transportation Needs: No Transportation Needs   Lack of Transportation (Medical): No   Lack of Transportation (Non-Medical): No  Physical Activity: Insufficiently Active   Days of Exercise per Week: 5 days   Minutes of Exercise per Session: 10 min  Stress: No Stress Concern Present   Feeling of Stress : Not at all  Social Connections: Moderately Integrated   Frequency of Communication with Friends and Family: More than three times a week   Frequency of Social Gatherings with Friends and Family: More than three times a week   Attends Religious Services: More than 4 times per year   Active Member of [employer] or Organizations: Yes   Attends Archivist Meetings: 1 to 4 times per year   Marital Status: Widowed  Human resources officer Violence: Not At Risk   Fear of Current or Ex-Partner: No   Emotionally Abused: No   Physically Abused: No   Sexually Abused: No    Family History:    Family History  Problem Relation Age of Onset   Breast cancer Mother      ROS:  Please see the history of present illness.   All other ROS reviewed and negative.     Physical Exam/Data:   Vitals:   12/25/20 1400 12/25/20 1537  12/25/20 1600 12/25/20 1630  BP: (!) 172/92 (!) 162/85 Marland Kitchen)  165/85 (!) 121/107  Pulse: 97 96 (!) 103 (!) 106  Resp: 20 20 (!) 21 16  Temp:      TempSrc:      SpO2: 100% 100% 100% 100%  Weight:      Height:       No intake or output data in the 24 hours ending 12/25/20 1652 Last 3 Weights 12/25/2020 08/19/2020 05/15/2020  Weight (lbs) 175 lb 172 lb 6.4 oz 184 lb  Weight (kg) 79.379 kg 78.2 kg 83.462 kg     Body mass index is 27.41 kg/m.  General:  Well nourished, well developed, in no acute distress HEENT: normal Neck: no JVD Vascular: No carotid bruits; Distal pulses 2+ bilaterally Cardiac:  normal S1, S2; RRR; no murmur  Lungs:  clear to auscultation bilaterally, no wheezing, rhonchi or rales  Abd: soft, nontender, no hepatomegaly  Ext: no edema Musculoskeletal:  No deformities, BUE and BLE strength normal and equal Skin: warm and dry  Neuro:  CNs 2-12 intact, no focal abnormalities noted Psych:  Normal affect   EKG:  The EKG was personally reviewed and demonstrates: Normal sinus rhythm, moderate ST elevation diffusely, no obvious PR depression Telemetry:  Telemetry was personally reviewed and demonstrates: Normal sinus rhythm, no significant ST-T wave changes  Relevant CV Studies:  Myoview 12/16/2018 There was no ST segment deviation noted during stress. The study is normal. This is a low risk study. The left ventricular ejection fraction is normal (55-65%). Nuclear stress EF: 57%. No T wave inversion was noted during stress.   1. Is a normal myocardial perfusion imaging study without evidence of ischemia or infarction. 2.  Significant diaphragm attenuation noted. 3.  Normal left-ventricular ejection fraction without wall motion normality.  Laboratory Data:  High Sensitivity Troponin:   Recent Labs  Lab 12/25/20 1241 12/25/20 1410  TROPONINIHS 8 77*     Chemistry Recent Labs  Lab 12/25/20 1241  NA 135  K 5.2*  CL 103  CO2 23  GLUCOSE 119*  BUN 23   CREATININE 1.32*  CALCIUM 9.0  GFRNONAA 41*  ANIONGAP 9    No results for input(s): PROT, ALBUMIN, AST, ALT, ALKPHOS, BILITOT in the last 168 hours. Lipids No results for input(s): CHOL, TRIG, HDL, LABVLDL, LDLCALC, CHOLHDL in the last 168 hours.  Hematology Recent Labs  Lab 12/25/20 1241  WBC 11.0*  RBC 5.48*  HGB 13.6  HCT 44.6  MCV 81.4  MCH 24.8*  MCHC 30.5  RDW 20.5*  PLT 294   Thyroid No results for input(s): TSH, FREET4 in the last 168 hours.  BNP Recent Labs  Lab 12/25/20 1241  BNP 137.4*    DDimer No results for input(s): DDIMER in the last 168 hours.   Radiology/Studies:  DG Chest Port 1 View  Result Date: 12/25/2020 CLINICAL DATA:  Shortness of breath EXAM: PORTABLE CHEST 1 VIEW COMPARISON:  Chest x-ray 12/14/2018 FINDINGS: Heart size and mediastinal contours are within normal limits. No suspicious pulmonary opacities identified. Elevated right hemidiaphragm. No pleural effusion or pneumothorax visualized. No acute osseous abnormality appreciated. IMPRESSION: No acute intrathoracic process identified. Electronically Signed   By: Ofilia Neas M.D.   On: 12/25/2020 12:58     Assessment and Plan:   Chest pain with elevated troponin:  -Likely myocarditis or pericarditis associated with COVID infection.  Suspicion for recurrent PE is low given therapeutic INR and 100% O2 saturation on room air.  - will defer to MD to consider if echocardiogram is needed at this  time.   History of massive PE on lifelong Coumadin: INR 2.7, therapeutic  Chronic diastolic heart failure: Euvolemic on exam  Coronary artery calcification: Seen on previous CT.  Negative Myoview in December 2020  Hypertension: Continue metoprolol and lisinopril.  Creatinine borderline elevated at 1.32.  Keep adequate hydration  Hyperlipidemia: Simvastatin  DM2  History of large bowel structure status post colectomy with colostomy bag  Hypothyroidism   Risk Assessment/Risk Scores:      HEAR Score (for undifferentiated chest pain):  HEAR Score: 4     For questions or updates, please contact [place] Please consult [email or web address] for contact info under    Hilbert Corrigan, [place]  12/25/2020 4:52 PM

## 2020-12-25 NOTE — ED Notes (Signed)
Attempted [place] IV x 1. Abby PA at bedside

## 2020-12-25 NOTE — Assessment & Plan Note (Signed)
a1c of 6.7 in august 2022 Hold metformin while inpatient SSI and accuchecks per protocol

## 2020-12-25 NOTE — ED Notes (Signed)
IV attempt x 1 unsuccessful, able to obtain lt green and purple tube

## 2020-12-25 NOTE — Assessment & Plan Note (Signed)
Baseline creatinine appears to be around 1.1-1.4 At baseline, continue to monitor.

## 2020-12-25 NOTE — Assessment & Plan Note (Addendum)
-  INR therapeutic -pharmacy consult for continued coumadin in patient, received 5mg  of coumadin; however patient with NSTEMI and will be stopping coumadin and changing over to heparin gtt once INR <2.5 per pharmacy

## 2020-12-25 NOTE — ED Provider Notes (Signed)
[facility] EMERGENCY DEPARTMENT Provider Note   CSN: [id number] Arrival date & time: 12/25/20  1145     History Chief Complaint  Patient presents with   Hypertension    Hannah [name] is a 80 y.o. female with a past medical history of acute massive pulmonary embolism with PEA arrest, chronic anticoagulation on Coumadin, history of diastolic CHF, diabetes, hyperlipidemia, hypertension, hypothyroidism, pulmonary hypertension who presents emergency department with chief complaint of chest pain and tightness.  Patient states that this morning she woke up around 3:00 in the morning coughing.  Since that time she has developed pain retrosternally.  She has difficult time describing the pain.  She feels like there is chest tightness which is worse when she lies back.  She is unsure if she has a history of CHF but denies any increased swelling.  She denies fever, chills, body aches, headache.  She denies diaphoresis, hemoptysis, unilateral leg swelling.  She is unsure of  her last INR result.  EMS found her blood pressure to be markedly elevated.   Hypertension      Past Medical History:  Diagnosis Date   Acute massive pulmonary embolism ([place]) 07/13/2012   Massive PE w/ PEA arrest 07/13/12 >TNK >IVC filter >discharged on comadin     Anticoagulated on warfarin    Chronic diastolic CHF (congestive heart failure) ([place]) 03/15/2017   Chronic kidney disease, stage 3a ([place]) 12/15/2018   Diabetes mellitus, type 2 ([place]) 07/15/2012   with peripheral neuropathy   DM2 (diabetes mellitus, type 2) ([place]) 07/23/2012   Hyperlipemia 07/14/2012   Hyperlipidemia 02/25/2013   Hypertension 07/14/2012   Hypothyroid 06/06/2013   Large bowel stricture ([place])    s/p colectomy in 2016   Neuropathy 02/12/2015   Osteoarthritis    s/p hip and knee replacements   Pulmonary hypertension ([place]) 07/26/2016   Syncope 10/2018   Thyroid disease     Patient Active Problem List   Diagnosis Date Noted    Thrombocytopenia ([place]) 04/23/2020   Acute GI bleeding 04/22/2020   Coronary artery calcification seen on CT scan 06/24/2019   Hypertension 04/25/2019   Chronic kidney disease, stage 3a ([place]) 12/15/2018   Syncope 10/30/2018   Frequent falls 05/21/2018   Encounter for therapeutic drug monitoring 05/07/2018   Large bowel stricture ([place]) 12/15/2017   Chronic diastolic CHF (congestive heart failure) ([place]) 03/15/2017   Anticoagulated on warfarin    Pulmonary hypertension ([place]) 07/26/2016   Neuropathy 02/12/2015   History of pulmonary embolism 06/10/2013   Hypothyroid 06/06/2013   Diabetes mellitus with neurological manifestations, controlled ([place]) 06/06/2013   Hyperlipidemia 02/25/2013   DM2 (diabetes mellitus, type 2) ([place]) 07/23/2012    Past Surgical History:  Procedure Laterality Date   COLONOSCOPY N/A 03/12/2016   Procedure: COLONOSCOPY;  Surgeon: Teena Irani, MD;  Location: [facility];  Service: Endoscopy;  Laterality: N/A;   COLOSTOMY N/A 06/03/2014   Procedure: COLOSTOMY;  Surgeon: Erroll Luna, MD;  Location: [facility];  Service: General;  Laterality: N/A;   FLEXIBLE SIGMOIDOSCOPY N/A 05/30/2014   Procedure: Beryle Quant;  Surgeon: Carol Ada, MD;  Location: [facility] ENDOSCOPY;  Service: Endoscopy;  Laterality: N/A;   INSERTION OF VENA CAVA FILTER N/A 07/16/2012   Procedure: INSERTION OF VENA CAVA FILTER;  Surgeon: Elam Dutch, MD;  Location[facility] CATH LAB;  Service: Cardiovascular;  Laterality: N/A;   KNEE ARTHROSCOPY     PARTIAL COLECTOMY N/A 06/03/2014   Procedure: PARTIAL COLECTOMY;  Surgeon: Erroll Luna, MD;  Location: [facility];  Service: General;  Laterality: N/A;   REDUCTION MAMMAPLASTY Bilateral    SP ARTHRO HIP*L*       OB History   No obstetric history on file.     Family History  Problem Relation Age of Onset   Breast cancer Mother     Social History   Tobacco Use   Smoking status: Former    Packs/day: 1.00    Years: 10.00    Pack years: 10.00     Types: Cigarettes    Quit date: 01/04/1968    Years since quitting: 53.0   Smokeless tobacco: Never  Vaping Use   Vaping Use: Never used  Substance Use Topics   Alcohol use: No   Drug use: No    Home Medications Prior to Admission medications   Medication Sig Start Date End Date Taking? Authorizing Provider  acetaminophen (TYLENOL) 500 MG tablet Take 500 mg by mouth every 6 (six) hours as needed for mild pain.    Yes [provider]  diclofenac Sodium (VOLTAREN) 1 % GEL Apply 4 g topically 4 (four) times daily. Patient taking differently: Apply 4 g topically daily as needed. 06/26/19  Yes Lamptey, Myrene Galas, MD  furosemide (LASIX) 20 MG tablet Take 1 tablet (20 mg total) by mouth daily as needed for fluid or edema. 06/28/18  Yes Lucretia Kern, DO  levothyroxine (EUTHYROX) 25 MCG tablet Take 1 tablet (25 mcg total) by mouth daily before breakfast. 08/25/20  Yes Koberlein, Junell C, MD  lisinopril (ZESTRIL) 5 MG tablet Take 1 tablet (5 mg total) by mouth daily. 11/24/20  Yes Koberlein, Junell C, MD  metFORMIN (GLUCOPHAGE-XR) 500 MG 24 hr tablet TAKE 1 TABLET BY MOUTH ONCE DAILY WITH BREAKFAST. PATIENT NEEDS AN APPT. 10/16/20  Yes Koberlein, Steele Berg, MD  metoprolol tartrate (LOPRESSOR) 25 MG tablet Take 1 tablet (25 mg total) by mouth 2 (two) times daily. 08/25/20  Yes Koberlein, Junell C, MD  pantoprazole (PROTONIX) 40 MG tablet Take 1 tablet (40 mg total) by mouth daily. 04/26/20  Yes Hosie Poisson, MD  simvastatin (ZOCOR) 20 MG tablet Take 1 tablet (20 mg total) by mouth at bedtime. 07/23/20  Yes Koberlein, Steele Berg, MD  vitamin B-12 (CYANOCOBALAMIN) 1000 MCG tablet Take 1 tablet (1,000 mcg total) by mouth daily. 05/15/20  Yes Koberlein, Steele Berg, MD  vitamin C (ASCORBIC ACID) 500 MG tablet Take 500 mg by mouth daily.    Yes [provider]  warfarin (COUMADIN) 5 MG tablet TAKE 1/2 TO 1 (ONE-HALF TO ONE) TABLET BY MOUTH DAILY AS DIRECTED BY  COUMADIN  CLINIC Patient taking  differently: Take 2.5-5 mg by mouth See admin instructions. Taking 1/2 tablet (2.5 mg) Tues, thur, Sat & Sunday, and taking 1 tablet ( $Remove'5mg'MDbFfZG$ ) on Monday, Wed, & Friday. 08/10/20  Yes Fay Records, MD  Blood Glucose Monitoring Suppl (ONETOUCH VERIO) w/Device KIT 1 each by Does not apply route as directed. 06/07/18   Koberlein, Andris Flurry C, MD  glucose blood (ONETOUCH VERIO) test strip USE 1 STRIP TO CHECK GLUCOSE ONCE DAILY AS DIRECTED 08/14/20   [name], MD  OneTouch Delica Lancets 06C MISC USE 1  TO CHECK GLUCOSE ONCE DAILY 07/02/18   [name], MD    Allergies    Augmentin [amoxicillin-pot clavulanate], Tranxene [clorazepate], Crestor [rosuvastatin], and Penicillins  Review of Systems   Review of Systems Ten systems reviewed and are negative for acute change, except as noted in the HPI.   Physical Exam Updated  Vital Signs BP (!) 172/92    Pulse 97    Temp 99.1 F (37.3 C) (Oral)    Resp 20    Ht _0  (1.702 m)    Wt 79.4 kg    SpO2 100%    BMI 27.41 kg/m   Physical Exam Vitals and nursing note reviewed.  Constitutional:      General: She is not in acute distress.    Appearance: She is well-developed. She is not diaphoretic.  HENT:     Head: Normocephalic and atraumatic.     Right Ear: External ear normal.     Left Ear: External ear normal.     Nose: Nose normal.     Mouth/Throat:     Mouth: Mucous membranes are moist.  Eyes:     General: No scleral icterus.    Conjunctiva/sclera: Conjunctivae normal.  Cardiovascular:     Rate and Rhythm: Normal rate and regular rhythm.     Heart sounds: Normal heart sounds. No murmur heard.   No friction rub. No gallop.  Pulmonary:     Effort: Pulmonary effort is normal. No respiratory distress.     Breath sounds: Normal breath sounds.  Abdominal:     General: Bowel sounds are normal. There is no distension.     Palpations: Abdomen is soft. There is no mass.     Tenderness: There is no abdominal tenderness. There is no  guarding.  Musculoskeletal:     Cervical back: Normal range of motion.  Skin:    General: Skin is warm and dry.  Neurological:     Mental Status: She is alert and oriented to person, place, and time.  Psychiatric:        Behavior: Behavior normal.    ED Results / Procedures / Treatments   Labs (all labs ordered are listed, but only abnormal results are displayed) Labs Reviewed  RESP PANEL BY RT-PCR (FLU A&B, COVID) ARPGX2 - Abnormal; Notable for the following components:      Result Value   SARS Coronavirus 2 by RT PCR POSITIVE (*)    All other components within normal limits  BASIC METABOLIC PANEL - Abnormal; Notable for the following components:   Potassium 5.2 (*)    Glucose, Bld 119 (*)    Creatinine, Ser 1.32 (*)    GFR, Estimated 41 (*)    All other components within normal limits  BRAIN NATRIURETIC PEPTIDE - Abnormal; Notable for the following components:   B Natriuretic Peptide 137.4 (*)    All other components within normal limits  CBC WITH DIFFERENTIAL/PLATELET - Abnormal; Notable for the following components:   WBC 11.0 (*)    RBC 5.48 (*)    MCH 24.8 (*)    RDW 20.5 (*)    Neutro Abs 8.6 (*)    All other components within normal limits  PROTIME-INR - Abnormal; Notable for the following components:   Prothrombin Time 28.5 (*)    INR 2.7 (*)    All other components within normal limits  TROPONIN I (HIGH SENSITIVITY) - Abnormal; Notable for the following components:   Troponin I (High Sensitivity) 77 (*)    All other components within normal limits  TROPONIN I (HIGH SENSITIVITY)    EKG EKG Interpretation  Date/Time:  Friday December 25 2020 11:57:32 EST Ventricular Rate:  87 PR Interval:  184 QRS Duration: 135 QT Interval:  353 QTC Calculation: 425 R Axis:   -58 Text Interpretation: Sinus rhythm LVH with IVCD, LAD and  secondary repol abnrm No significant change since last tracing No STEMI Confirmed by Regan Lemming ([id number]) on 12/25/2020 2:35:14  PM  Radiology DG Chest Port 1 View  Result Date: 12/25/2020 CLINICAL DATA:  Shortness of breath EXAM: PORTABLE CHEST 1 VIEW COMPARISON:  Chest x-ray 12/14/2018 FINDINGS: Heart size and mediastinal contours are within normal limits. No suspicious pulmonary opacities identified. Elevated right hemidiaphragm. No pleural effusion or pneumothorax visualized. No acute osseous abnormality appreciated. IMPRESSION: No acute intrathoracic process identified. Electronically Signed   By: Ofilia Neas M.D.   On: 12/25/2020 12:58    Procedures Procedures   Medications Ordered in ED Medications - No data to display  ED Course  I have reviewed the triage vital signs and the nursing notes.  Pertinent labs & imaging results that were available during my care of the patient were reviewed by me and considered in my medical decision making (see chart for details).    MDM Rules/Calculators/A&P                         \80 year old female who presents with chest pain and tightness. The emergent differential diagnosis of chest pain includes: Acute coronary syndrome, pericarditis, aortic dissection, pulmonary embolism, tension pneumothorax, pneumonia, and esophageal rupture. EKG shows a rate of 87 with diffuse ST segment elevation suggestive of pericarditis.  Patient does feel better when she is leaning forward.  I reviewed the patient's labs which show troponin of 77, PT/INR is therapeutic at 2.7 making it less likely that this patient has pulmonary embolus.  BMP shows a slightly elevated potassium which may have been due to hemolysis, patient's creatinine is 1.3 which appears to be her baseline. Patient's respiratory panel is positive for COVID Ordered and reviewed a chest x-ray which shows no acute abnormalities.  Discussed with Dr. Eliberto Ivory of the hospitalist service who will admit the patient.  I also discussed the case with APP Angie with cardiology.  Their service will consult on the patient. Final  Clinical Impression(s) / ED Diagnoses Final diagnoses:  None    Rx / DC Orders ED Discharge Orders     None        Margarita Mail, PA-C 12/25/20 1625    Regan Lemming, MD 12/25/20 [phone]

## 2020-12-25 NOTE — Assessment & Plan Note (Addendum)
Troponin 8>77>1105 Discussed with on call cardiology, Dr. Royann Shivers Will stop coumadin and start heparin drip (when INR less than 2.5 per pharmacy) If needs cath may be able to do Monday: per cardiology  On statin, beta blocker Start aspirin

## 2020-12-25 NOTE — Assessment & Plan Note (Signed)
Appears to be incidental/mild. Outside of oral pill tx window.  Has been coughing x 6-7 days Droplet/contact precautions Will check covid labs today to get baseline Supportive care with antitussive, vitamins, albuterol prn although she has no SOB.

## 2020-12-25 NOTE — Assessment & Plan Note (Addendum)
Euvolemic on exam Last echo: 4/22 Inferior basal hypokinesis abnormal septal motion, EF of 45-50% with grade 1 diastolic dysfunction.  -intake/output -continue  medical management with ACE-I/metoprolol  -repeat echo in setting of covid/chest pain/nstemi

## 2020-12-25 NOTE — ED Notes (Signed)
Dr Karene Fry at bedside, attempting USIV. 1st attempt unsuccessful but got labs, trying again for IV

## 2020-12-25 NOTE — H&P (Addendum)
History and Physical    Hannah [name] EPP:[id number] DOB: [birth year] DOA: 12/25/2020  PCP: [name], MD Consultants:  cardiology: Dr. Harrington Challenger, GI: Dr. Collene Mares Patient coming from:  Home - lives alone with aide 3x/week   Chief Complaint: chest pain  HPI: Hannah [name] is a 80 y.o. female with medical history significant of PEA secondary to large PE in 2014 on coumadin, T2DM, HLD, HTN, hypothyroidism, diastolic &systolic CHF, CKD stage IIIa, large bowel stricture s/p colostomy bag who presents to ED with chest pain and pressure. She states she woke up at [facility] with cough and consistent chest pain and pressure that feels better when she leans forward. Pain is superior sternal area and described as sharp and rated as a 5-6/10. Radiated down her right arm. No associated nausea/vomiting or shortness of breath. She isn't sure how long the pain lasted. She thinks it was seconds to minutes. She no longer has the chest pain. Her cough is dry in nature, no shortness of breath. Her daughter states she starting coughing last Saturday. Coughing comes in spells, has not worsened. No sick contacts. Otherwise feeling well.   She has overall been feeling well. No fever/chills, no headaches, no shortness of breath, no abdominal pain, no N/V/D, no leg swelling, no dysuria.   Risks for heart disease:  She does not smoke or drink. History of heart disease in her mother. She does have T2DM that is well controlled. Also has HLD.   ED Course: vitals: temp: 99.1, bp: 186/105, HR: 96, RR:16, oxygen: 100% RA Pertinent labs: covid positive, potassium: 5.2, creatinine: 1.32, bnp: 137.4, wbc: 11.0, troponin: 8>77, INR: 2.7 CXR: no acute findings  In ED: cardiology consulted and [facility] was asked to admit.   Review of Systems: As per HPI; otherwise review of systems reviewed and negative.   Ambulatory Status:  Ambulates with walker   COVID Vaccine Status:  she has had original 2 shots and 2 boosters.   Past Medical  History:  Diagnosis Date   Acute massive pulmonary embolism ([place]) 07/13/2012   Massive PE w/ PEA arrest 07/13/12 >TNK >IVC filter >discharged on comadin     Anticoagulated on warfarin    Chronic diastolic CHF (congestive heart failure) ([place]) 03/15/2017   Chronic kidney disease, stage 3a ([place]) 12/15/2018   Diabetes mellitus, type 2 ([place]) 07/15/2012   with peripheral neuropathy   DM2 (diabetes mellitus, type 2) ([place]) 07/23/2012   Hyperlipemia 07/14/2012   Hyperlipidemia 02/25/2013   Hypertension 07/14/2012   Hypothyroid 06/06/2013   Large bowel stricture ([place])    s/p colectomy in 2016   Neuropathy 02/12/2015   Osteoarthritis    s/p hip and knee replacements   Pulmonary hypertension ([place]) 07/26/2016   Syncope 10/2018   Thyroid disease     Past Surgical History:  Procedure Laterality Date   COLONOSCOPY N/A 03/12/2016   Procedure: COLONOSCOPY;  Surgeon: Teena Irani, MD;  Location: [place];  Service: Endoscopy;  Laterality: N/A;   COLOSTOMY N/A 06/03/2014   Procedure: COLOSTOMY;  Surgeon: Erroll Luna, MD;  Location: [place];  Service: General;  Laterality: N/A;   FLEXIBLE SIGMOIDOSCOPY N/A 05/30/2014   Procedure: Beryle Quant;  Surgeon: Carol Ada, MD;  Location: [facility] ENDOSCOPY;  Service: Endoscopy;  Laterality: N/A;   INSERTION OF VENA CAVA FILTER N/A 07/16/2012   Procedure: INSERTION OF VENA CAVA FILTER;  Surgeon: Elam Dutch, MD;  Location: [facility] CATH LAB;  Service: Cardiovascular;  Laterality: N/A;   KNEE ARTHROSCOPY     PARTIAL  COLECTOMY N/A 06/03/2014   Procedure: PARTIAL COLECTOMY;  Surgeon: Erroll Luna, MD;  Location: [place];  Service: General;  Laterality: N/A;   REDUCTION MAMMAPLASTY Bilateral    SP ARTHRO HIP*L*      Social History   Socioeconomic History   Marital status: Widowed    Spouse name: Not on file   Number of children: Not on file   Years of education: Not on file   Highest education level: Not on file  Occupational History   Not on file  Tobacco  Use   Smoking status: Former    Packs/day: 1.00    Years: 10.00    Pack years: 10.00    Types: Cigarettes    Quit date: 01/04/1968    Years since quitting: 53.0   Smokeless tobacco: Never  Vaping Use   Vaping Use: Never used  Substance and Sexual Activity   Alcohol use: No   Drug use: No   Sexual activity: Not on file  Other Topics Concern   Not on file  Social History Narrative   Lives alone.          Social Determinants of Health   Financial Resource Strain: Low Risk    Difficulty of Paying Living Expenses: Not hard at all  Food Insecurity: No Food Insecurity   Worried About Charity fundraiser in the Last Year: Never true   [place] in the Last Year: Never true  Transportation Needs: No Transportation Needs   Lack of Transportation (Medical): No   Lack of Transportation (Non-Medical): No  Physical Activity: Insufficiently Active   Days of Exercise per Week: 5 days   Minutes of Exercise per Session: 10 min  Stress: No Stress Concern Present   Feeling of Stress : Not at all  Social Connections: Moderately Integrated   Frequency of Communication with Friends and Family: More than three times a week   Frequency of Social Gatherings with Friends and Family: More than three times a week   Attends Religious Services: More than 4 times per year   Active Member of [employer] or Organizations: Yes   Attends Archivist Meetings: 1 to 4 times per year   Marital Status: Widowed  Human resources officer Violence: Not At Risk   Fear of Current or Ex-Partner: No   Emotionally Abused: No   Physically Abused: No   Sexually Abused: No    Allergies  Allergen Reactions   Augmentin [Amoxicillin-Pot Clavulanate] Itching and Other (See Comments)    Severe vaginal itching   Tranxene [Clorazepate] Itching   Crestor [Rosuvastatin]     Made her sick on stomach, she is able to tolerate the zocor   Penicillins Itching and Rash    Has patient had a PCN reaction causing immediate  rash, facial/tongue/throat swelling, SOB or lightheadedness with hypotension: Yes Has patient had a PCN reaction causing severe rash involving mucus membranes or skin necrosis: No Has patient had a PCN reaction that required hospitalization: No Has patient had a PCN reaction occurring within the last 10 years: No If all of the above answers are "NO", then may proceed with Cephalosporin use.     Family History  Problem Relation Age of Onset   Breast cancer Mother     Prior to Admission medications   Medication Sig Start Date End Date Taking? Authorizing Provider  acetaminophen (TYLENOL) 500 MG tablet Take 500 mg by mouth every 6 (six) hours as needed for mild pain.    Yes  [provider]  diclofenac Sodium (VOLTAREN) 1 % GEL Apply 4 g topically 4 (four) times daily. Patient taking differently: Apply 4 g topically daily as needed. 06/26/19  Yes Lamptey, Myrene Galas, MD  furosemide (LASIX) 20 MG tablet Take 1 tablet (20 mg total) by mouth daily as needed for fluid or edema. 06/28/18  Yes Lucretia Kern, DO  levothyroxine (EUTHYROX) 25 MCG tablet Take 1 tablet (25 mcg total) by mouth daily before breakfast. 08/25/20  Yes Koberlein, Junell C, MD  lisinopril (ZESTRIL) 5 MG tablet Take 1 tablet (5 mg total) by mouth daily. 11/24/20  Yes Koberlein, Junell C, MD  metFORMIN (GLUCOPHAGE-XR) 500 MG 24 hr tablet TAKE 1 TABLET BY MOUTH ONCE DAILY WITH BREAKFAST. PATIENT NEEDS AN APPT. 10/16/20  Yes Koberlein, Steele Berg, MD  metoprolol tartrate (LOPRESSOR) 25 MG tablet Take 1 tablet (25 mg total) by mouth 2 (two) times daily. 08/25/20  Yes Koberlein, Junell C, MD  pantoprazole (PROTONIX) 40 MG tablet Take 1 tablet (40 mg total) by mouth daily. 04/26/20  Yes Hosie Poisson, MD  simvastatin (ZOCOR) 20 MG tablet Take 1 tablet (20 mg total) by mouth at bedtime. 07/23/20  Yes Koberlein, Steele Berg, MD  vitamin B-12 (CYANOCOBALAMIN) 1000 MCG tablet Take 1 tablet (1,000 mcg total) by mouth daily. 05/15/20  Yes  Koberlein, Steele Berg, MD  vitamin C (ASCORBIC ACID) 500 MG tablet Take 500 mg by mouth daily.    Yes [provider]  warfarin (COUMADIN) 5 MG tablet TAKE 1/2 TO 1 (ONE-HALF TO ONE) TABLET BY MOUTH DAILY AS DIRECTED BY  COUMADIN  CLINIC Patient taking differently: Take 2.5-5 mg by mouth See admin instructions. Taking 1/2 tablet (2.5 mg) Tues, thur, Sat & Sunday, and taking 1 tablet ( 54m) on Monday, Wed, & Friday. 08/10/20  Yes RFay Records MD  Blood Glucose Monitoring Suppl (ONETOUCH VERIO) w/Device KIT 1 each by Does not apply route as directed. 06/07/18   KCaren Macadam MD  glucose blood (ONETOUCH VERIO) test strip USE 1 STRIP TO CHECK GLUCOSE ONCE DAILY AS DIRECTED 08/14/20   KCaren Macadam MD  OneTouch Delica Lancets 344BMISC USE 1  TO CHECK GLUCOSE ONCE DAILY 07/02/18   KCaren Macadam MD    Physical Exam: Vitals:   12/25/20 1630 12/25/20 1700 12/25/20 1800 12/25/20 1830  BP: (!) 121/107 (!) 156/97 (!) 151/99 134/81  Pulse: (!) 106 (!) 111 (!) 103 (!) 107  Resp: '16 19 19 20  ' Temp:      TempSrc:      SpO2: 100% 98% 99% 97%  Weight:      Height:         General:  Appears calm and comfortable and is in NAD Eyes:  PERRL, EOMI, normal lids, iris ENT:  grossly normal hearing, lips & tongue, mmm; appropriate dentition Neck:  no LAD, masses or thyromegaly; no carotid bruits Cardiovascular:  RRR, no m/r/g. No LE edema.  Respiratory:   CTA bilaterally with no wheezes/rales/rhonchi.  Normal respiratory effort. Abdomen:  soft, NT, ND, NABS. Ostomy bag present  Back:   normal alignment, no CVAT Skin:  no rash or induration seen on limited exam Musculoskeletal:  grossly normal tone BUE/BLE, good ROM, no bony abnormality Lower extremity:  No LE edema.  Limited foot exam with no ulcerations.  2+ distal pulses. Psychiatric:  grossly normal mood and affect, speech fluent and appropriate, AOx3 Neurologic:  CN 2-12 grossly intact, moves all extremities in coordinated  fashion, sensation  intact    Radiological Exams on Admission: Independently reviewed - see discussion in A/P where applicable  DG Chest Port 1 View  Result Date: 12/25/2020 CLINICAL DATA:  Shortness of breath EXAM: PORTABLE CHEST 1 VIEW COMPARISON:  Chest x-ray 12/14/2018 FINDINGS: Heart size and mediastinal contours are within normal limits. No suspicious pulmonary opacities identified. Elevated right hemidiaphragm. No pleural effusion or pneumothorax visualized. No acute osseous abnormality appreciated. IMPRESSION: No acute intrathoracic process identified. Electronically Signed   By: Ofilia Neas M.D.   On: 12/25/2020 12:58    EKG: Independently reviewed.  NSR with rate 87; nonspecific ST changes with no evidence of acute ischemia, PR segment depression    Labs on Admission: I have personally reviewed the available labs and imaging studies at the time of the admission.  Pertinent labs:  covid positive,  potassium: 5.2,  creatinine: 1.32, (1.1-1.4)  bnp: 137.4,  wbc: 11.0,  troponin: 8>77,  INR: 2.7  Assessment/Plan Chest pain with elevated troponin  80 year old female presenting with sudden onset chest pain and pressure with radiation to right arm that lasted a few seconds to minutes. Chest pain has resolved, still has pressure at times. Risks factors include recent covid infection, T2DM, HLD, HTN, FH and age.  -place in observation - In setting of covid, r/o pericarditis. Check echo/inflammatory markers -troponin 8>77, mild ST elevation.  Cardiology consulted. Continue to trend troponin, repeat ekg and place on telemetry   NSTEMI (non-ST elevated myocardial infarction) [facility]) Troponin 8>77>1105 Discussed with on call cardiology, Dr. Sallyanne Kuster Will stop coumadin and start heparin drip (when INR less than 2.5 per pharmacy) If needs cath may be able to do Monday: per cardiology  On statin, beta blocker Start aspirin  Stress test in 12/20: normal study   COVID-19 virus  infection Appears to be incidental/mild. Outside of oral pill tx window.  Has been coughing x 6-7 days Droplet/contact precautions Will check covid labs today to get baseline Supportive care with antitussive, vitamins, albuterol prn although she has no SOB.   History of pulmonary embolism -INR therapeutic -pharmacy consult for continued coumadin in patient, received 71m of coumadin; however patient with NSTEMI and will be stopping coumadin and changing over to heparin gtt once INR <2.5 per pharmacy   DM2 (diabetes mellitus, type 2) ([place] a1c of 6.7 in august 2022 Hold metformin while inpatient SSI and accuchecks per protocol   Chronic diastolic CHF (congestive heart failure) [place] on exam Last echo: 4/22  Inferior basal hypokinesis abnormal septal motion, EF of 45-50% with grade 1 diastolic dysfunction.  -intake/output -continue  medical management with ACE-I/metoprolol  -repeat echo in setting of covid/chest pain/nstemi   Chronic kidney disease, stage 3a (HCC) Baseline creatinine appears to be around 1.1-1.4 At baseline, continue to monitor.  Hypothyroid TSH wnl in august 2022 Continue synthroid 236m  Hyperlipidemia Continue zocor  Last lipid panel 08/2020, LDL 74  Hypertension Very well controlled Continue home medication of lisinopril 37m437mnd metoprolol 237m6mD    Body mass index is 27.41 kg/m.   Level of care: Telemetry Cardiac DVT prophylaxis:  coumadin >>heparin gtt Code Status:  Full - confirmed with patient Family Communication: None present; I spoke with the patient's daughter by telephone at the time of admission. VaneDelman [phone] Plan:  The patient is from: home  Anticipated d/c is to: home  Patient placed in observation as anticipate less than 2 midnight stay. Requires hospitalization due to chest pain/NSTEMI requiring IV medication, close monitoring and  possible intervention and MDM with specialists.     Patient is  currently: stable  Consults called: cardiology by edp  Admission status:  observation    Orma Flaming MD [facility]   How to contact the [facility] Attending or Consulting provider [place] or covering provider during after hours [place], for this patient?  Check the care team in [facility] and look for a) attending/consulting [facility] provider listed and b) the [facility] team listed Log into [email or web address] and use [facility] universal password to access. If you do not have the password, please contact the hospital operator. Locate the [facility] provider you are looking for under [facility] and page to a number that you can be directly reached. If you still have difficulty reaching the provider, please page the [facility] (Director on Call) for the Hospitalists listed on amion for assistance.   12/25/2020, 8:54 PM

## 2020-12-25 NOTE — Assessment & Plan Note (Signed)
TSH wnl in august 2022 Continue synthroid 

## 2020-12-25 NOTE — ED Notes (Signed)
Attempted to call report, no response.   

## 2020-12-25 NOTE — Progress Notes (Addendum)
ANTICOAGULATION CONSULT NOTE - Initial Consult  Pharmacy Consult for warfarin Indication: pulmonary embolus  Allergies  Allergen Reactions   Augmentin [Amoxicillin-Pot Clavulanate] Itching and Other (See Comments)    Severe vaginal itching   Tranxene [Clorazepate] Itching   Crestor [Rosuvastatin]     Made her sick on stomach, she is able to tolerate the zocor   Penicillins Itching and Rash    Has patient had a PCN reaction causing immediate rash, facial/tongue/throat swelling, SOB or lightheadedness with hypotension: Yes Has patient had a PCN reaction causing severe rash involving mucus membranes or skin necrosis: No Has patient had a PCN reaction that required hospitalization: No Has patient had a PCN reaction occurring within the last 10 years: No If all of the above answers are "NO", then may proceed with Cephalosporin use.     Patient Measurements: Height: 5\' 7"  (170.2 cm) Weight: 79.4 kg (175 lb) IBW/kg (Calculated) : 61.6  Vital Signs: Temp: 99.1 F (37.3 C) (12/23 1158) Temp Source: Oral (12/23 1158) BP: 165/85 (12/23 1600) Pulse Rate: 103 (12/23 1600)  Labs: Recent Labs    12/25/20 1241 12/25/20 1410  HGB 13.6  --   HCT 44.6  --   PLT 294  --   LABPROT  --  28.5*  INR  --  2.7*  CREATININE 1.32*  --   TROPONINIHS 8 77*    Estimated Creatinine Clearance: 37.5 mL/min (A) (by C-G formula based on SCr of 1.32 mg/dL (H)).   Medical History: Past Medical History:  Diagnosis Date   Acute massive pulmonary embolism (HCC) 07/13/2012   Massive PE w/ PEA arrest 07/13/12 >TNK >IVC filter >discharged on comadin     Anticoagulated on warfarin    Chronic diastolic CHF (congestive heart failure) (HCC) 03/15/2017   Chronic kidney disease, stage 3a (HCC) 12/15/2018   Diabetes mellitus, type 2 (HCC) 07/15/2012   with peripheral neuropathy   DM2 (diabetes mellitus, type 2) (HCC) 07/23/2012   Hyperlipemia 07/14/2012   Hyperlipidemia 02/25/2013   Hypertension 07/14/2012    Hypothyroid 06/06/2013   Large bowel stricture (HCC)    s/p colectomy in 2016   Neuropathy 02/12/2015   Osteoarthritis    s/p hip and knee replacements   Pulmonary hypertension (HCC) 07/26/2016   Syncope 10/2018   Thyroid disease     Medications:  (Not in a hospital admission)   Assessment: 39 YOF with h/o of PE on warfarin at home. INR on admission is therapeutic at 2.7. Last dose of warfarin was on 12/22.   H/H and Plt wnl.   Home warfarin: 5 mg MWF; 2.5 mg on all other days   Goal of Therapy:  INR 2.5-3.5  Monitor platelets by anticoagulation protocol: Yes   Plan:  -Warfarin 5 mg x 1 dose now -F/u daily INR and dose accordingly  -Monitor for s/s of bleeding   1/23, PharmD., BCPS, BCCCP Clinical Pharmacist Please refer to [facility] for unit-specific pharmacist   Addendum: Now switching to IV heparin. Warfarin dose has been given today. Will d/c warfarin and plan to start IV heparin infusion when INR < 2.5.   [facility], PharmD., BCPS, BCCCP Clinical Pharmacist Please refer to [facility] for unit-specific pharmacist

## 2020-12-25 NOTE — Assessment & Plan Note (Signed)
Very well controlled Continue home medication of lisinopril 5mg  and metoprolol 25mg  BID

## 2020-12-25 NOTE — ED Notes (Signed)
CRITICAL VALUE STICKER  CRITICAL VALUE: Trop 1222  RECEIVER (on-site recipient of call): Avalin Briley  DATE & TIME NOTIFIED: 12/25/20 1835  MESSENGER (representative from lab):  MD NOTIFIED: Artis Flock  TIME OF NOTIFICATION: 1845  RESPONSE:  cards aware

## 2020-12-25 NOTE — ED Triage Notes (Signed)
Pt BIBA from home. At 0300, pt started with unproductive cough, leading to chest pain at 1045.  BP has been 260/140 on EMS arrival and has gotten progressively more hypertensive.  Pt states she took BP meds this AM.

## 2020-12-26 ENCOUNTER — Observation Stay (HOSPITAL_COMMUNITY): Payer: Medicare Other

## 2020-12-26 ENCOUNTER — Other Ambulatory Visit: Payer: Self-pay

## 2020-12-26 ENCOUNTER — Other Ambulatory Visit (HOSPITAL_COMMUNITY): Payer: Medicare Other

## 2020-12-26 DIAGNOSIS — U071 COVID-19: Secondary | ICD-10-CM | POA: Diagnosis not present

## 2020-12-26 DIAGNOSIS — E039 Hypothyroidism, unspecified: Secondary | ICD-10-CM | POA: Diagnosis not present

## 2020-12-26 DIAGNOSIS — R079 Chest pain, unspecified: Secondary | ICD-10-CM

## 2020-12-26 DIAGNOSIS — I3139 Other pericardial effusion (noninflammatory): Secondary | ICD-10-CM | POA: Diagnosis not present

## 2020-12-26 DIAGNOSIS — I214 Non-ST elevation (NSTEMI) myocardial infarction: Secondary | ICD-10-CM

## 2020-12-26 DIAGNOSIS — I130 Hypertensive heart and chronic kidney disease with heart failure and stage 1 through stage 4 chronic kidney disease, or unspecified chronic kidney disease: Secondary | ICD-10-CM | POA: Diagnosis not present

## 2020-12-26 DIAGNOSIS — N1831 Chronic kidney disease, stage 3a: Secondary | ICD-10-CM | POA: Diagnosis not present

## 2020-12-26 DIAGNOSIS — I319 Disease of pericardium, unspecified: Secondary | ICD-10-CM | POA: Diagnosis not present

## 2020-12-26 DIAGNOSIS — I4892 Unspecified atrial flutter: Secondary | ICD-10-CM | POA: Diagnosis not present

## 2020-12-26 DIAGNOSIS — I272 Pulmonary hypertension, unspecified: Secondary | ICD-10-CM | POA: Diagnosis not present

## 2020-12-26 DIAGNOSIS — I5032 Chronic diastolic (congestive) heart failure: Secondary | ICD-10-CM | POA: Diagnosis not present

## 2020-12-26 DIAGNOSIS — E7849 Other hyperlipidemia: Secondary | ICD-10-CM | POA: Diagnosis not present

## 2020-12-26 DIAGNOSIS — I420 Dilated cardiomyopathy: Secondary | ICD-10-CM | POA: Diagnosis not present

## 2020-12-26 DIAGNOSIS — I480 Paroxysmal atrial fibrillation: Secondary | ICD-10-CM | POA: Diagnosis not present

## 2020-12-26 DIAGNOSIS — I5022 Chronic systolic (congestive) heart failure: Secondary | ICD-10-CM | POA: Diagnosis not present

## 2020-12-26 DIAGNOSIS — R072 Precordial pain: Secondary | ICD-10-CM | POA: Diagnosis not present

## 2020-12-26 DIAGNOSIS — E1122 Type 2 diabetes mellitus with diabetic chronic kidney disease: Secondary | ICD-10-CM | POA: Diagnosis not present

## 2020-12-26 LAB — ECHOCARDIOGRAM COMPLETE
AR max vel: 2.59 cm2
AV Area VTI: 2.38 cm2
AV Area mean vel: 2.45 cm2
AV Mean grad: 2 mmHg
AV Peak grad: 4.5 mmHg
Ao pk vel: 1.06 m/s
Area-P 1/2: 2.97 cm2
Height: 67 in
MV VTI: 2.36 cm2
S' Lateral: 2.3 cm
Weight: 2694.9 oz

## 2020-12-26 LAB — URINALYSIS, ROUTINE W REFLEX MICROSCOPIC
Bilirubin Urine: NEGATIVE
Glucose, UA: NEGATIVE mg/dL
Ketones, ur: NEGATIVE mg/dL
Leukocytes,Ua: NEGATIVE
Nitrite: POSITIVE — AB
Protein, ur: NEGATIVE mg/dL
Specific Gravity, Urine: 1.01 (ref 1.005–1.030)
pH: 8.5 — ABNORMAL HIGH (ref 5.0–8.0)

## 2020-12-26 LAB — CBC
HCT: 42.4 % (ref 36.0–46.0)
Hemoglobin: 12.9 g/dL (ref 12.0–15.0)
MCH: 23.9 pg — ABNORMAL LOW (ref 26.0–34.0)
MCHC: 30.4 g/dL (ref 30.0–36.0)
MCV: 78.5 fL — ABNORMAL LOW (ref 80.0–100.0)
Platelets: 229 K/uL (ref 150–400)
RBC: 5.4 MIL/uL — ABNORMAL HIGH (ref 3.87–5.11)
RDW: 20.1 % — ABNORMAL HIGH (ref 11.5–15.5)
WBC: 9.3 K/uL (ref 4.0–10.5)
nRBC: 0 % (ref 0.0–0.2)

## 2020-12-26 LAB — BASIC METABOLIC PANEL
Anion gap: 9 (ref 5–15)
BUN: 16 mg/dL (ref 8–23)
CO2: 23 mmol/L (ref 22–32)
Calcium: 8.9 mg/dL (ref 8.9–10.3)
Chloride: 102 mmol/L (ref 98–111)
Creatinine, Ser: 1.23 mg/dL — ABNORMAL HIGH (ref 0.44–1.00)
GFR, Estimated: 45 mL/min — ABNORMAL LOW (ref >60)
Glucose, Bld: 102 mg/dL — ABNORMAL HIGH (ref 70–99)
Potassium: 4.2 mmol/L (ref 3.5–5.1)
Sodium: 134 mmol/L — ABNORMAL LOW (ref 135–145)

## 2020-12-26 LAB — MRSA NEXT GEN BY PCR, NASAL: MRSA by PCR Next Gen: NOT DETECTED

## 2020-12-26 LAB — URINALYSIS, MICROSCOPIC (REFLEX)

## 2020-12-26 LAB — C-REACTIVE PROTEIN: CRP: 9.4 mg/dL — ABNORMAL HIGH (ref <1.0)

## 2020-12-26 LAB — GLUCOSE, CAPILLARY
Glucose-Capillary: 95 mg/dL (ref 70–99)
Glucose-Capillary: 143 mg/dL — ABNORMAL HIGH (ref 70–99)
Glucose-Capillary: 99 mg/dL (ref 70–99)
Glucose-Capillary: 124 mg/dL — ABNORMAL HIGH (ref 70–99)

## 2020-12-26 LAB — PROTIME-INR
INR: 2.8 — ABNORMAL HIGH (ref 0.8–1.2)
Prothrombin Time: 29.6 seconds — ABNORMAL HIGH (ref 11.4–15.2)

## 2020-12-26 MED ORDER — ACETAMINOPHEN 325 MG PO TABS
650.0000 mg | ORAL_TABLET | Freq: Four times a day (QID) | ORAL | Status: DC | PRN
  Administered 2020-12-26 – 2020-12-27 (×2): 650 mg via ORAL
  Filled 2020-12-26 (×2): qty 2

## 2020-12-26 MED ORDER — SODIUM CHLORIDE 0.9 % IV SOLN
100.0000 mg | Freq: Every day | INTRAVENOUS | Status: AC
  Administered 2020-12-27 – 2020-12-30 (×4): 100 mg via INTRAVENOUS
  Filled 2020-12-26 (×6): qty 20

## 2020-12-26 MED ORDER — SODIUM CHLORIDE 0.9 % IV SOLN
200.0000 mg | Freq: Once | INTRAVENOUS | Status: AC
  Administered 2020-12-26: 17:00:00 200 mg via INTRAVENOUS
  Filled 2020-12-26: qty 40

## 2020-12-26 NOTE — Progress Notes (Signed)
PROGRESS NOTE        PATIENT DETAILS Name: Hannah [name] Age: 80 y.o. Sex: female Date of Birth: [birth year] Admit Date: 12/25/2020 Admitting Physician Orland Mustard, MD QQV:[id number], Paris Lore, MD  Brief Narrative: Patient is a 80 y.o. female with history of PEA arrest due to a large PE in 2014-on anticoagulation with Coumadin, DM-2, HTN, HLD, large bowel stricture-s/p colectomy with ostomy in place-presented to the hospital with chest pain.  See below for further details.  Subjective: Had retrosternal chest pain last night/early morning-chest pain-free when I saw her earlier.  Objective: Vitals: Blood pressure (!) 110/56, pulse 95, temperature (!) (P) 101 F (38.3 C), temperature source (P) Oral, resp. rate 17, height 5\' 7"  (1.702 m), weight 76.4 kg, SpO2 95 %.   Exam: Gen Exam:Alert awake-not in any distress HEENT:atraumatic, normocephalic Chest: B/L clear to auscultation anteriorly CVS:S1S2 regular Abdomen:soft non tender, non distended.  Ostomy in place. Extremities:no edema Neurology: Non focal Skin: no rash  Pertinent Labs/Radiology: Recent Labs  Lab 12/26/20 0741  WBC 9.3  HGB 12.9  PLT 229  NA 134*  K 4.2  CREATININE 1.23*   Assessment/Plan: Chest pain with elevated troponin: Not felt to be pericarditis per cardiology-awaiting echo to assess LV function/wall motion-await further recommendations from cardiology.  COVID-19 infection: Appears to be incidental-denies any symptoms.  However febrile this morning-cycle threshold around 23 (spoke with microbiology lab).  CXR without any pneumonia.  Start Remdesivir x3 days-check UA/blood cultures-repeat CXR/CRP tomorrow morning.    History of PEA arrest due to massive pulmonary embolism: On Coumadin with therapeutic INR  CKD stage IIIa: Creatinine close to baseline-follow renal function periodically.  Chronic diastolic heart failure: Euvolemic on exam  HTN: BP stable-continue metoprolol  and lisinopril.    HLD: Continue statin  Hypothyroidism: Continue Synthroid  DM-2 (A1c 6.09 August 2020): CBG stable-continue SSI  Recent Labs    12/25/20 1727 12/26/20 0639  GLUCAP 138* 95    Chronic debility/deconditioning: At baseline-walks with the help of a walker-not very ambulatory.  PT/OT eval will be ordered.  BMI Estimated body mass index is 26.38 kg/m as calculated from the following:   Height as of this encounter: 5\' 7"  (1.702 m).   Weight as of this encounter: 76.4 kg.    Procedures: None Consults: Cardiology DVT Prophylaxis: Coumadin/heparin Code Status:Full code or DNR Family Communication: None at bedside  Time spent: 35 minutes-Greater than 50% of this time was spent in counseling, explanation of diagnosis, planning of further management, and coordination of care.   Disposition Plan: Status is: Observation  The patient will require care spanning > 2 midnights and should be moved to inpatient because: Chest pain-possible non-STEMI-COVID-febrile this morning-checking cultures-starting IV Remdesivir-not yet stable for discharge.   Diet: Diet Order             Diet heart healthy/carb modified Room service appropriate? Yes; Fluid consistency: Thin  Diet effective now                     Antimicrobial agents: Anti-infectives (From admission, onward)    None        MEDICATIONS: Scheduled Meds:  vitamin C  500 mg Oral Daily   aspirin EC  81 mg Oral Daily   insulin aspart  0-9 Units Subcutaneous TID WC   levothyroxine  25 mcg Oral QAC breakfast  lisinopril  5 mg Oral Daily   metoprolol tartrate  25 mg Oral BID   pantoprazole  40 mg Oral Daily   simvastatin  20 mg Oral QHS   sodium chloride flush  3 mL Intravenous Q12H   zinc sulfate  220 mg Oral Daily   Continuous Infusions:  sodium chloride     PRN Meds:.sodium chloride, albuterol, guaiFENesin-dextromethorphan, ondansetron **OR** ondansetron (ZOFRAN) IV, sodium chloride  flush   I have personally reviewed following labs and imaging studies  LABORATORY DATA: CBC: Recent Labs  Lab 12/25/20 1241 12/26/20 0741  WBC 11.0* 9.3  NEUTROABS 8.6*  --   HGB 13.6 12.9  HCT 44.6 42.4  MCV 81.4 78.5*  PLT 294 229    Basic Metabolic Panel: Recent Labs  Lab 12/25/20 1241 12/26/20 0741  NA 135 134*  K 5.2* 4.2  CL 103 102  CO2 23 23  GLUCOSE 119* 102*  BUN 23 16  CREATININE 1.32* 1.23*  CALCIUM 9.0 8.9    GFR: Estimated Creatinine Clearance: 39.5 mL/min (A) (by C-G formula based on SCr of 1.23 mg/dL (H)).  Liver Function Tests: No results for input(s): AST, ALT, ALKPHOS, BILITOT, PROT, ALBUMIN in the last 168 hours. No results for input(s): LIPASE, AMYLASE in the last 168 hours. No results for input(s): AMMONIA in the last 168 hours.  Coagulation Profile: Recent Labs  Lab 12/25/20 1410 12/26/20 0114  INR 2.7* 2.8*    Cardiac Enzymes: No results for input(s): CKTOTAL, CKMB, CKMBINDEX, TROPONINI in the last 168 hours.  BNP (last 3 results) No results for input(s): PROBNP in the last 8760 hours.  Lipid Profile: No results for input(s): CHOL, HDL, LDLCALC, TRIG, CHOLHDL, LDLDIRECT in the last 72 hours.  Thyroid Function Tests: No results for input(s): TSH, T4TOTAL, FREET4, T3FREE, THYROIDAB in the last 72 hours.  Anemia Panel: Recent Labs    12/25/20 1708  FERRITIN 16    Urine analysis:    Component Value Date/Time   COLORURINE YELLOW 05/21/2018 0635   APPEARANCEUR HAZY (A) 05/21/2018 0635   LABSPEC 1.020 05/21/2018 0635   PHURINE 5.0 05/21/2018 0635   GLUCOSEU NEGATIVE 05/21/2018 0635   HGBUR NEGATIVE 05/21/2018 0635   BILIRUBINUR NEGATIVE 05/21/2018 0635   KETONESUR NEGATIVE 05/21/2018 0635   PROTEINUR NEGATIVE 05/21/2018 0635   UROBILINOGEN 0.2 03/14/2017 1102   NITRITE NEGATIVE 05/21/2018 0635   LEUKOCYTESUR NEGATIVE 05/21/2018 0635    Sepsis Labs: Lactic Acid, Venous    Component Value Date/Time   LATICACIDVEN  0.86 12/15/2017 2122    MICROBIOLOGY: Recent Results (from the past 240 hour(s))  Resp Panel by RT-PCR (Flu A&B, Covid) Nasopharyngeal Swab     Status: Abnormal   Collection Time: 12/25/20 12:23 PM   Specimen: Nasopharyngeal Swab; Nasopharyngeal(NP) swabs in vial transport medium  Result Value Ref Range Status   SARS Coronavirus 2 by RT PCR POSITIVE (A) NEGATIVE Final    Comment: (NOTE) SARS-CoV-2 target nucleic acids are DETECTED.  The SARS-CoV-2 RNA is generally detectable in upper respiratory specimens during the acute phase of infection. Positive results are indicative of the presence of the identified virus, but do not rule out bacterial infection or co-infection with other pathogens not detected by the test. Clinical correlation with patient history and other diagnostic information is necessary to determine patient infection status. The expected result is Negative.  Fact Sheet for Patients: BloggerCourse.com  Fact Sheet for Healthcare Providers: SeriousBroker.it  This test is not yet approved or cleared by the [place] and  has been authorized for detection and/or diagnosis of SARS-CoV-2 by FDA under an Emergency Use Authorization (EUA).  This EUA will remain in effect (meaning this test can be used) for the duration of  the COVID-19 declaration under Section 564(b)(1) of the A ct, 21 U.S.C. section 360bbb-3(b)(1), unless the authorization is terminated or revoked sooner.     Influenza A by PCR NEGATIVE NEGATIVE Final   Influenza B by PCR NEGATIVE NEGATIVE Final    Comment: (NOTE) The Xpert Xpress SARS-CoV-2/FLU/RSV plus assay is intended as an aid in the diagnosis of influenza from Nasopharyngeal swab specimens and should not be used as a sole basis for treatment. Nasal washings and aspirates are unacceptable for Xpert Xpress SARS-CoV-2/FLU/RSV testing.  Fact Sheet for  Patients: EntrepreneurPulse.com.au  Fact Sheet for Healthcare Providers: IncredibleEmployment.be  This test is not yet approved or cleared by the [place] FDA and has been authorized for detection and/or diagnosis of SARS-CoV-2 by FDA under an Emergency Use Authorization (EUA). This EUA will remain in effect (meaning this test can be used) for the duration of the COVID-19 declaration under Section 564(b)(1) of the Act, 21 U.S.C. section 360bbb-3(b)(1), unless the authorization is terminated or revoked.  Performed at [facility], [place] [address] [place], [place] [address]   MRSA Next Gen by PCR, Nasal     Status: None   Collection Time: 12/26/20 12:20 AM   Specimen: Nasal Mucosa; Nasal Swab  Result Value Ref Range Status   MRSA by PCR Next Gen NOT DETECTED NOT DETECTED Final    Comment: (NOTE) The GeneXpert MRSA Assay (FDA approved for NASAL specimens only), is one component of a comprehensive MRSA colonization surveillance program. It is not intended to diagnose MRSA infection nor to guide or monitor treatment for MRSA infections. Test performance is not FDA approved in patients less than 4 years old. Performed at [facility], [place] [address], [place], [place] [address]     RADIOLOGY STUDIES/RESULTS: DG Chest Port 1 View  Result Date: 12/25/2020 CLINICAL DATA:  Shortness of breath EXAM: PORTABLE CHEST 1 VIEW COMPARISON:  Chest x-ray 12/14/2018 FINDINGS: Heart size and mediastinal contours are within normal limits. No suspicious pulmonary opacities identified. Elevated right hemidiaphragm. No pleural effusion or pneumothorax visualized. No acute osseous abnormality appreciated. IMPRESSION: No acute intrathoracic process identified. Electronically Signed   By: Ofilia Neas M.D.   On: 12/25/2020 12:58     LOS: 0 days   Oren Binet, MD  [facility]    To contact the attending provider between 7A-7P or the  covering provider during after hours 7P-7A, please log into the web site [email or web address] and access using universal [place] password for that web site. If you do not have the password, please call the hospital operator.  12/26/2020, 10:26 AM

## 2020-12-26 NOTE — Progress Notes (Signed)
° °  Progress Note  Patient Name: Hannah [name] Date of Encounter: 12/26/2020  Primary Cardiologist: Dorris Carnes, MD  Cardiology consultation note from Dr. Harl Bowie reviewed.  Patient awaits echocardiogram for assessment of LVEF and wall motion abnormalities.  She has had no recent chest pain, preparing to work with PT.  Did have fever to 101 degrees this morning.  Heart rate is in the 80s in sinus rhythm by telemetry which I personally reviewed.  Blood pressure otherwise stable.  Lab work shows high-sensitivity troponin I up to 1222, BNP 137, CRP 3.0 up to 9.4 and normal ESR of 17.  D-dimer is also normal at 0.45.  She is therapeutic on Coumadin with INR of 2.8.  Chest x-ray reports no acute process.  ECG shows sinus rhythm with IVCD and LVH, left anterior fascicular block, rule out old anterior infarct pattern.  ACS not excluded, although with COVID-19 and recent fever, would also be suspicious of myocarditis.  Continue aspirin, lisinopril, Lopressor, and Zocor.  She is on Coumadin with therapeutic INR, no indication for heparin.  We will continue to follow with further recommendations once echocardiogram performed.  Signed, Rozann Lesches, MD  12/26/2020, 12:06 PM

## 2020-12-26 NOTE — Evaluation (Signed)
Physical Therapy Evaluation Patient Details Name: Hannah [name] MRN: [id number] DOB: 10-06-1940 Today's Date: 12/26/2020  History of Present Illness  80 y.o. female presented 12/25/20 to the hospital with chest pain.  +COVID, elevated troponins with ?myocarditis or pericarditis associated with COVID  PMH-- PEA arrest due to a large PE in 2014-on anticoagulation with Coumadin, DM-2, HTN, HLD, large bowel stricture-s/p colectomy with ostomy in place  Clinical Impression   Pt admitted secondary to problem above with deficits below. PTA patient was living alone with intermittent assistance from aide (3 days/week) and daughters (for errands). Pt currently requires min assist for bed mobility (likely due to bed different than home) and supervision for ambulation with RW. Patient reports she has at times gone to stay with her daughter, and is not sure if she will do that this time.  Anticipate patient will benefit from PT to address problems listed below.Will continue to follow acutely to maximize functional mobility independence and safety.          Recommendations for follow up therapy are one component of a multi-disciplinary discharge planning process, led by the attending physician.  Recommendations may be updated based on patient status, additional functional criteria and insurance authorization.  Follow Up Recommendations No PT follow up    Assistance Recommended at Discharge Intermittent Supervision/Assistance  Functional Status Assessment Patient has had a recent decline in their functional status and demonstrates the ability to make significant improvements in function in a reasonable and predictable amount of time.  Equipment Recommendations  None recommended by PT    Recommendations for Other Services       Precautions / Restrictions Precautions Precautions: Fall Precaution Comments: denies falls at home Restrictions Weight Bearing Restrictions: No      Mobility  Bed  Mobility Overal bed mobility: Needs Assistance Bed Mobility: Rolling;Sidelying to Sit;Sit to Supine Rolling: Modified independent (Device/Increase time) (with rail) Sidelying to sit: Min assist;HOB elevated   Sit to supine: Min assist   General bed mobility comments: pt trying to grab edge of mattress as she does at home to help pull her torso upright (no place to grab on hospital bed) therefore required min assist; min assist to raise second leg onto bed with vc's for technique    Transfers Overall transfer level: Needs assistance Equipment used: Rolling walker (2 wheels) Transfers: Sit to/from Stand Sit to Stand: Min guard;From elevated surface (elevated bed to simulate home)           General transfer comment: minguard for safety and near need for assist (pt required 2 attempts to get to stand)    Ambulation/Gait Ambulation/Gait assistance: Supervision Gait Distance (Feet): 25 Feet Assistive device: Rolling walker (2 wheels) Gait Pattern/deviations: Step-to pattern;Decreased stride length;Trunk flexed   Gait velocity interpretation: <1.8 ft/sec, indicate of risk for recurrent falls   General Gait Details: pt steady with RW, however moves very slowly and deliberately  Stairs            Wheelchair Mobility    Modified Rankin (Stroke Patients Only)       Balance Overall balance assessment: Needs assistance Sitting-balance support: No upper extremity supported;Feet supported Sitting balance-Leahy Scale: Good     Standing balance support: Reliant on assistive device for balance;Bilateral upper extremity supported Standing balance-Leahy Scale: Poor Standing balance comment: at her baseline                             Pertinent Vitals/Pain Pain  Assessment: No/denies pain    Home Living Family/patient expects to be discharged to:: Private residence Living Arrangements: Alone Available Help at Discharge: Family;Available  PRN/intermittently;Personal care attendant (aide 3x/wk for several hours each day) Type of Home: House Home Access: Stairs to enter Entrance Stairs-Rails: Right;Left;Can reach both Entrance Stairs-Number of Steps: 4   Home Layout: One level Home Equipment: Rollator (4 wheels);BSC/3in1;Cane - single point (uses BSC over toilet)      Prior Function Prior Level of Function : Needs assist       Physical Assist : Mobility (physical);ADLs (physical) Mobility (physical): Stairs ADLs (physical): Bathing;Dressing;IADLs Mobility Comments: always has help on stairs; modified independent with rollator in home ADLs Comments: daughters do grocery shopping and drive to appts; aide comes 3x/wk and hleps with bath at sink     Hand Dominance   Dominant Hand: Right    Extremity/Trunk Assessment   Upper Extremity Assessment Upper Extremity Assessment: Generalized weakness    Lower Extremity Assessment Lower Extremity Assessment: Generalized weakness    Cervical / Trunk Assessment Cervical / Trunk Assessment: Kyphotic  Communication   Communication: No difficulties  Cognition Arousal/Alertness: Awake/alert Behavior During Therapy: WFL for tasks assessed/performed Overall Cognitive Status: Within Functional Limits for tasks assessed                                          General Comments General comments (skin integrity, edema, etc.): HR 109-119 bpm with activity; sats 96% on room air    Exercises     Assessment/Plan    PT Assessment Patient needs continued PT services  PT Problem List Decreased strength;Decreased balance;Decreased mobility       PT Treatment Interventions DME instruction;Gait training;Stair training;Functional mobility training;Therapeutic activities;Therapeutic exercise;Patient/family education    PT Goals (Current goals can be found in the Care Plan section)  Acute Rehab PT Goals Patient Stated Goal: keep up her strength PT Goal  Formulation: With patient Time For Goal Achievement: 01/09/21 Potential to Achieve Goals: Good    Frequency Min 3X/week   Barriers to discharge Decreased caregiver support daughters live nearby and pt thinks she will go stay with one of her daughters    Co-evaluation               AM-PAC PT "6 Clicks" Mobility  Outcome Measure Help needed turning from your back to your side while in a flat bed without using bedrails?: None Help needed moving from lying on your back to sitting on the side of a flat bed without using bedrails?: A Little Help needed moving to and from a bed to a chair (including a wheelchair)?: A Little Help needed standing up from a chair using your arms (e.g., wheelchair or bedside chair)?: A Little Help needed to walk in hospital room?: A Little Help needed climbing 3-5 steps with a railing? : A Little 6 Click Score: 19    End of Session   Activity Tolerance: Patient tolerated treatment well Patient left: in bed;with call bell/phone within reach;with bed alarm set (to have echo, so returned to bed) Nurse Communication: Mobility status;Other (comment) (discharge to daughter's would be preferred) PT Visit Diagnosis: Difficulty in walking, not elsewhere classified (R26.2);Muscle weakness (generalized) (M62.81)    Time: [id number] PT Time Calculation (min) (ACUTE ONLY): 33 min   Charges:   PT Evaluation $PT Eval Moderate Complexity: 1 Mod PT Treatments $Gait Training: 8-22 mins  Arby Barrette, PT Acute Rehabilitation Services  Pager [phone] Office [phone]   Rexanne Mano 12/26/2020, 12:51 PM

## 2020-12-26 NOTE — Evaluation (Signed)
Occupational Therapy Evaluation Patient Details Name: Hannah [name] MRN: [id number] DOB: Oct 05, 1940 Today's Date: 12/26/2020   History of Present Illness 80 y.o. female presented 12/25/20 to the hospital with chest pain.  +COVID, elevated troponins with ?myocarditis or pericarditis associated with COVID  PMH-- PEA arrest due to a large PE in 2014-on anticoagulation with Coumadin, DM-2, HTN, HLD, large bowel stricture-s/p colectomy with ostomy in place   Clinical Impression   Pt admitted for concerns listed above. PTA Pt reported that she was independent with functional mobility and most ADL's. She has an aide 3 days per week that assists with baths, and daughters that assist with IADL's. At this time, pt presented with difficulty sitting EOB, requiring max A, and she was unable to come to stand with max A and RW. This session presentation was very different than when PT worked with her earlier, RN aware. Recommending SNF due to presentation this session, if pt is able to progress consistently to min A-min guard, pt can go home with [facility] services. OT will continue to follow acutely.       Recommendations for follow up therapy are one component of a multi-disciplinary discharge planning process, led by the attending physician.  Recommendations may be updated based on patient status, additional functional criteria and insurance authorization.   Follow Up Recommendations  Skilled nursing-short term rehab (<3 hours/day)    Assistance Recommended at Discharge Frequent or constant Supervision/Assistance  Functional Status Assessment  Patient has had a recent decline in their functional status and demonstrates the ability to make significant improvements in function in a reasonable and predictable amount of time.  Equipment Recommendations  Other (comment) (TBD)    Recommendations for Other Services       Precautions / Restrictions Precautions Precautions: Fall Precaution Comments: denies falls at  home Restrictions Weight Bearing Restrictions: No      Mobility Bed Mobility Overal bed mobility: Needs Assistance Bed Mobility: Rolling;Sidelying to Sit;Sit to Supine Rolling: Modified independent (Device/Increase time) (with rail) Sidelying to sit: HOB elevated;Max assist   Sit to supine: Mod assist   General bed mobility comments: Pt unable to push up to sitting with out mod A, then as she sat, she was unable to maintain her balance and scoot forward on EOB.    Transfers Overall transfer level: Needs assistance Equipment used: Rolling walker (2 wheels) Transfers: Sit to/from Stand Sit to Stand: From elevated surface;Max assist (elevated bed to simulate home)           General transfer comment: Pt was unable to come to stand with Max A this session.      Balance Overall balance assessment: Needs assistance Sitting-balance support: Feet supported;Bilateral upper extremity supported Sitting balance-Leahy Scale: Zero Sitting balance - Comments: Pt unable to maintain upright posture, requiring max A   Standing balance support: Reliant on assistive device for balance;Bilateral upper extremity supported Standing balance-Leahy Scale: Zero Standing balance comment: Unable to come to stand this session                           ADL either performed or assessed with clinical judgement   ADL Overall ADL's : Needs assistance/impaired Eating/Feeding: Set up;Bed level   Grooming: Set up;Bed level   Upper Body Bathing: Minimal assistance;Bed level   Lower Body Bathing: Maximal assistance;Bed level   Upper Body Dressing : Minimal assistance;Bed level   Lower Body Dressing: Maximal assistance;Bed level       Toileting-  Clothing Manipulation and Hygiene: Maximal assistance;Bed level         General ADL Comments: Pt unable to maintain midlinesitting EOB     Vision Baseline Vision/History: 0 No visual deficits Ability to See in Adequate Light: 0  Adequate Patient Visual Report: No change from baseline Vision Assessment?: No apparent visual deficits     Perception     Praxis      Pertinent Vitals/Pain Pain Assessment: No/denies pain     Hand Dominance Right   Extremity/Trunk Assessment Upper Extremity Assessment Upper Extremity Assessment: Generalized weakness   Lower Extremity Assessment Lower Extremity Assessment: Defer to PT evaluation   Cervical / Trunk Assessment Cervical / Trunk Assessment: Kyphotic   Communication Communication Communication: No difficulties   Cognition Arousal/Alertness: Awake/alert Behavior During Therapy: WFL for tasks assessed/performed Overall Cognitive Status: No family/caregiver present to determine baseline cognitive functioning                                 General Comments: Pt having difficulty figuring out how to stand. Not aware that she kept falling backwards sitting EOB.     General Comments       Exercises     Shoulder Instructions      Home Living Family/patient expects to be discharged to:: Private residence Living Arrangements: Alone Available Help at Discharge: Family;Available PRN/intermittently;Personal care attendant (aide 3x/wk for several hours each day) Type of Home: House Home Access: Stairs to enter [employer] of Steps: 4 Entrance Stairs-Rails: Right;Left;Can reach both Home Layout: One level         Firefighter: Standard     Home Equipment: Rollator (4 wheels);BSC/3in1;Cane - single point (uses BSC over toilet)          Prior Functioning/Environment Prior Level of Function : Needs assist       Physical Assist : Mobility (physical);ADLs (physical) Mobility (physical): Stairs ADLs (physical): Bathing;Dressing;IADLs Mobility Comments: always has help on stairs; modified independent with rollator in home ADLs Comments: daughters do grocery shopping and drive to appts; aide comes 3x/wk and hleps with bath at  sink        OT Problem List: Decreased strength;Decreased activity tolerance;Impaired balance (sitting and/or standing);Decreased coordination;Decreased cognition;Decreased safety awareness;Decreased knowledge of use of DME or AE;Cardiopulmonary status limiting activity;Impaired UE functional use      OT Treatment/Interventions: Self-care/ADL training;Therapeutic exercise;Energy conservation;DME and/or AE instruction;Therapeutic activities;Patient/family education;Balance training    OT Goals(Current goals can be found in the care plan section) Acute Rehab OT Goals Patient Stated Goal: To go home OT Goal Formulation: With patient Time For Goal Achievement: 01/09/21 Potential to Achieve Goals: Fair ADL Goals Pt Will Perform Lower Body Bathing: with min guard assist;sitting/lateral leans;sit to/from stand Pt Will Perform Lower Body Dressing: with min guard assist;sitting/lateral leans;sit to/from stand Pt Will Transfer to Toilet: with min assist;ambulating Pt Will Perform Toileting - Clothing Manipulation and hygiene: with min guard assist;sitting/lateral leans;sit to/from stand Additional ADL Goal #1: Pt will complete bed mobility with min guard as a precursor to seated ADL's. Additional ADL Goal #2: Pt will sit EOB with no assist for 3 mins as a precursor to seated tasks.  OT Frequency: Min 2X/week   Barriers to D/C:    Pt lives alone       Co-evaluation              AM-PAC OT "6 Clicks" Daily Activity     Outcome  Measure Help from another person eating meals?: A Little Help from another person taking care of personal grooming?: A Little Help from another person toileting, which includes using toliet, bedpan, or urinal?: A Lot Help from another person bathing (including washing, rinsing, drying)?: A Lot Help from another person to put on and taking off regular upper body clothing?: A Lot Help from another person to put on and taking off regular lower body clothing?: A  Lot 6 Click Score: 14   End of Session Equipment Utilized During Treatment: Gait belt;Rolling walker (2 wheels) Nurse Communication: Mobility status  Activity Tolerance: Patient limited by fatigue Patient left: in bed;with call bell/phone within reach;with bed alarm set  OT Visit Diagnosis: Unsteadiness on feet (R26.81);Other abnormalities of gait and mobility (R26.89);Muscle weakness (generalized) (M62.81)                Time: [id number] OT Time Calculation (min): 16 min Charges:  OT General Charges $OT Visit: 1 Visit OT Evaluation $OT Eval Moderate Complexity: 1 Mod  Hannah Coia H., OTR/L Acute Rehabilitation  Hannah [name] 12/26/2020, 4:40 PM

## 2020-12-26 NOTE — Progress Notes (Signed)
ANTICOAGULATION CONSULT NOTE - Follow up [facility] for warfarin > heparin  Indication: pulmonary embolus  Allergies  Allergen Reactions   Augmentin [Amoxicillin-Pot Clavulanate] Itching and Other (See Comments)    Severe vaginal itching   Tranxene [Clorazepate] Itching   Crestor [Rosuvastatin]     Made her sick on stomach, she is able to tolerate the zocor   Penicillins Itching and Rash    Has patient had a PCN reaction causing immediate rash, facial/tongue/throat swelling, SOB or lightheadedness with hypotension: Yes Has patient had a PCN reaction causing severe rash involving mucus membranes or skin necrosis: No Has patient had a PCN reaction that required hospitalization: No Has patient had a PCN reaction occurring within the last 10 years: No If all of the above answers are "NO", then may proceed with Cephalosporin use.     Patient Measurements: Height: '5\' 7"'  (170.2 cm) Weight: 76.4 kg (168 lb 6.9 oz) IBW/kg (Calculated) : 61.6  Vital Signs: Temp: 101 F (38.3 C) (12/24 0752) Temp Source: Oral (12/24 0752) BP: 110/56 (12/24 0752) Pulse Rate: 95 (12/24 0752)  Labs: Recent Labs    12/25/20 1241 12/25/20 1410 12/25/20 1700 12/25/20 1708 12/26/20 0114 12/26/20 0741  HGB 13.6  --   --   --   --  12.9  HCT 44.6  --   --   --   --  42.4  PLT 294  --   --   --   --  229  LABPROT  --  28.5*  --   --  29.6*  --   INR  --  2.7*  --   --  2.8*  --   CREATININE 1.32*  --   --   --   --  1.23*  TROPONINIHS 8 77* 1,105* 1,222*  --   --      Estimated Creatinine Clearance: 39.5 mL/min (A) (by C-G formula based on SCr of 1.23 mg/dL (H)).   Medical History: Past Medical History:  Diagnosis Date   Acute massive pulmonary embolism ([place]) 07/13/2012   Massive PE w/ PEA arrest 07/13/12 >TNK >IVC filter >discharged on comadin     Anticoagulated on warfarin    Chronic diastolic CHF (congestive heart failure) ([place]) 03/15/2017   Chronic kidney disease, stage 3a  ([place]) 12/15/2018   Diabetes mellitus, type 2 ([place]) 07/15/2012   with peripheral neuropathy   DM2 (diabetes mellitus, type 2) ([place]) 07/23/2012   Hyperlipemia 07/14/2012   Hyperlipidemia 02/25/2013   Hypertension 07/14/2012   Hypothyroid 06/06/2013   Large bowel stricture ([place])    s/p colectomy in 2016   Neuropathy 02/12/2015   Osteoarthritis    s/p hip and knee replacements   Pulmonary hypertension ([place]) 07/26/2016   Syncope 10/2018   Thyroid disease     Medications:  Medications Prior to Admission  Medication Sig Dispense Refill Last Dose   acetaminophen (TYLENOL) 500 MG tablet Take 500 mg by mouth every 6 (six) hours as needed for mild pain.    unk   diclofenac Sodium (VOLTAREN) 1 % GEL Apply 4 g topically 4 (four) times daily. (Patient taking differently: Apply 4 g topically daily as needed.)   unk   furosemide (LASIX) 20 MG tablet Take 1 tablet (20 mg total) by mouth daily as needed for fluid or edema. 30 tablet 3    levothyroxine (EUTHYROX) 25 MCG tablet Take 1 tablet (25 mcg total) by mouth daily before breakfast. 90 tablet 1 12/25/2020   lisinopril (ZESTRIL) 5  MG tablet Take 1 tablet (5 mg total) by mouth daily. 90 tablet 1 12/25/2020   metFORMIN (GLUCOPHAGE-XR) 500 MG 24 hr tablet TAKE 1 TABLET BY MOUTH ONCE DAILY WITH BREAKFAST. PATIENT NEEDS AN APPT. 90 tablet 1 12/25/2020   metoprolol tartrate (LOPRESSOR) 25 MG tablet Take 1 tablet (25 mg total) by mouth 2 (two) times daily. 180 tablet 1 12/25/2020 at 0700   pantoprazole (PROTONIX) 40 MG tablet Take 1 tablet (40 mg total) by mouth daily. 30 tablet 1 12/25/2020   simvastatin (ZOCOR) 20 MG tablet Take 1 tablet (20 mg total) by mouth at bedtime. 90 tablet 1 12/24/2020   vitamin B-12 (CYANOCOBALAMIN) 1000 MCG tablet Take 1 tablet (1,000 mcg total) by mouth daily.   12/24/2020   vitamin C (ASCORBIC ACID) 500 MG tablet Take 500 mg by mouth daily.    12/24/2020   warfarin (COUMADIN) 5 MG tablet TAKE 1/2 TO 1 (ONE-HALF TO ONE) TABLET BY MOUTH  DAILY AS DIRECTED BY  COUMADIN  CLINIC (Patient taking differently: Take 2.5-5 mg by mouth See admin instructions. Taking 1/2 tablet (2.5 mg) Tues, thur, Sat & Sunday, and taking 1 tablet ( 81m) on Monday, Wed, & Friday.) 80 tablet 1 12/24/2020 at 1700   Blood Glucose Monitoring Suppl (ONETOUCH VERIO) w/Device KIT 1 each by Does not apply route as directed. 1 kit 0    glucose blood (ONETOUCH VERIO) test strip USE 1 STRIP TO CHECK GLUCOSE ONCE DAILY AS DIRECTED 100 each 3    OneTouch Delica Lancets 306VMISC USE 1  TO CHECK GLUCOSE ONCE DAILY 100 each 3     Assessment: 80YOF with h/o of PE on warfarin at home. INR on admission is therapeutic at 2.7 > 2.8 today after warfarin dose 519mx1 last pm   H/H and Plt wnl.  Planning to switch warfarin to heparin for possible ACS work up - follow up plan  Home warfarin: 5 mg MWF; 2.5 mg on all other days   Goal of Therapy:  INR 2.5-3.5   per outpatient records  Heparin level 0.3-0.7 Monitor platelets by anticoagulation protocol: Yes   Plan:  -holding warfarin Begin heparin when INR <2 -F/u daily INR and CBC  -Monitor for s/s of bleeding   BeAlbertina ParrPharmD., BCPS, BCCCP Clinical Pharmacist Please refer to [facility]-specific pharmacist    LiBonnita Nasutiharm.D. CPP, BCPS Clinical Pharmacist [phone]/24/2022 3:09 PM   Please refer to [facility] pharmacist

## 2020-12-26 NOTE — Consult Note (Signed)
WOC Nurse ostomy consult note Consult for ostomy supplies and guidance for the staff in the management of her stoma is performed remotely. Stoma type/location: LUQ colostomy. At the time of  our team's last face-to-face assessment (2019), patient was independent in the management of her well-established ostomy (2016, Dr. Luisa Hart). During a hospitalization in 2020, I provided assistance to staff for obtaining supplies, only Stomal assessment/size: Not seen today Ostomy pouching: 2pc. Flat 2 and 3/4 inch pouching system.  Pouch is [employer], Skin barrier is [place] # 2. Enrolled patient in [employer] DC program: No Nursing staff to assist patient as needed with her ostomy (changing, emptying).   WOC nursing team will not follow, but will remain available to this patient, the nursing and medical teams.  Please re-consult if needed. Thanks, Ladona Mow, MSN, RN, GNP, [place]  Pager# [phone]

## 2020-12-26 NOTE — Progress Notes (Signed)
°  Echocardiogram 2D Echocardiogram has been performed.  Gerda Diss 12/26/2020, 1:57 PM

## 2020-12-26 NOTE — Plan of Care (Signed)
Initiate Care Plan  Problem: Education: Goal: Knowledge of General Education information will improve Description: Including pain rating scale, medication(s)/side effects and non-pharmacologic comfort measures Outcome: Progressing   Problem: Health Behavior/Discharge Planning: Goal: Ability to manage health-related needs will improve Outcome: Progressing   Problem: Clinical Measurements: Goal: Ability to maintain clinical measurements within normal limits will improve Outcome: Progressing Goal: Will remain free from infection Outcome: Progressing Goal: Diagnostic test results will improve Outcome: Progressing Goal: Respiratory complications will improve Outcome: Progressing Goal: Cardiovascular complication will be avoided Outcome: Progressing   Problem: Activity: Goal: Risk for activity intolerance will decrease Outcome: Progressing   Problem: Nutrition: Goal: Adequate nutrition will be maintained Outcome: Progressing   Problem: Coping: Goal: Level of anxiety will decrease Outcome: Progressing   Problem: Elimination: Goal: Will not experience complications related to bowel motility Outcome: Progressing Goal: Will not experience complications related to urinary retention Outcome: Progressing   Problem: Pain Managment: Goal: General experience of comfort will improve Outcome: Progressing   Problem: Safety: Goal: Ability to remain free from injury will improve Outcome: Progressing   Problem: Skin Integrity: Goal: Risk for impaired skin integrity will decrease Outcome: Progressing   Problem: Education: Goal: Knowledge of risk factors and measures for prevention of condition will improve Outcome: Progressing   Problem: Coping: Goal: Psychosocial and spiritual needs will be supported Outcome: Progressing   Problem: Respiratory: Goal: Will maintain a patent airway Outcome: Progressing Goal: Complications related to the disease process, condition or treatment  will be avoided or minimized Outcome: Progressing   Problem: Education: Goal: Understanding of cardiac disease, CV risk reduction, and recovery process will improve Outcome: Progressing Goal: Individualized Educational Video(s) Outcome: Progressing   Problem: Activity: Goal: Ability to tolerate increased activity will improve Outcome: Progressing   Problem: Cardiac: Goal: Ability to achieve and maintain adequate cardiovascular perfusion will improve Outcome: Progressing   Problem: Health Behavior/Discharge Planning: Goal: Ability to safely manage health-related needs after discharge will improve Outcome: Progressing

## 2020-12-27 ENCOUNTER — Observation Stay (HOSPITAL_COMMUNITY): Payer: Medicare Other

## 2020-12-27 DIAGNOSIS — I5021 Acute systolic (congestive) heart failure: Secondary | ICD-10-CM | POA: Diagnosis not present

## 2020-12-27 DIAGNOSIS — I255 Ischemic cardiomyopathy: Secondary | ICD-10-CM

## 2020-12-27 DIAGNOSIS — R0602 Shortness of breath: Secondary | ICD-10-CM | POA: Diagnosis not present

## 2020-12-27 DIAGNOSIS — R072 Precordial pain: Secondary | ICD-10-CM | POA: Diagnosis not present

## 2020-12-27 DIAGNOSIS — R778 Other specified abnormalities of plasma proteins: Secondary | ICD-10-CM | POA: Diagnosis present

## 2020-12-27 DIAGNOSIS — N1831 Chronic kidney disease, stage 3a: Secondary | ICD-10-CM | POA: Diagnosis not present

## 2020-12-27 DIAGNOSIS — I480 Paroxysmal atrial fibrillation: Secondary | ICD-10-CM | POA: Diagnosis not present

## 2020-12-27 DIAGNOSIS — Z888 Allergy status to other drugs, medicaments and biological substances status: Secondary | ICD-10-CM | POA: Diagnosis not present

## 2020-12-27 DIAGNOSIS — I4891 Unspecified atrial fibrillation: Secondary | ICD-10-CM

## 2020-12-27 DIAGNOSIS — E039 Hypothyroidism, unspecified: Secondary | ICD-10-CM | POA: Diagnosis not present

## 2020-12-27 DIAGNOSIS — Z7901 Long term (current) use of anticoagulants: Secondary | ICD-10-CM | POA: Diagnosis not present

## 2020-12-27 DIAGNOSIS — Z9049 Acquired absence of other specified parts of digestive tract: Secondary | ICD-10-CM | POA: Diagnosis not present

## 2020-12-27 DIAGNOSIS — U071 COVID-19: Secondary | ICD-10-CM | POA: Diagnosis present

## 2020-12-27 DIAGNOSIS — I319 Disease of pericardium, unspecified: Secondary | ICD-10-CM | POA: Diagnosis not present

## 2020-12-27 DIAGNOSIS — Z8674 Personal history of sudden cardiac arrest: Secondary | ICD-10-CM | POA: Diagnosis not present

## 2020-12-27 DIAGNOSIS — I4892 Unspecified atrial flutter: Secondary | ICD-10-CM | POA: Diagnosis not present

## 2020-12-27 DIAGNOSIS — I130 Hypertensive heart and chronic kidney disease with heart failure and stage 1 through stage 4 chronic kidney disease, or unspecified chronic kidney disease: Secondary | ICD-10-CM | POA: Diagnosis not present

## 2020-12-27 DIAGNOSIS — E1122 Type 2 diabetes mellitus with diabetic chronic kidney disease: Secondary | ICD-10-CM | POA: Diagnosis not present

## 2020-12-27 DIAGNOSIS — Z880 Allergy status to penicillin: Secondary | ICD-10-CM | POA: Diagnosis not present

## 2020-12-27 DIAGNOSIS — I38 Endocarditis, valve unspecified: Secondary | ICD-10-CM | POA: Diagnosis not present

## 2020-12-27 DIAGNOSIS — E7849 Other hyperlipidemia: Secondary | ICD-10-CM | POA: Diagnosis not present

## 2020-12-27 DIAGNOSIS — I5022 Chronic systolic (congestive) heart failure: Secondary | ICD-10-CM | POA: Diagnosis not present

## 2020-12-27 DIAGNOSIS — Z933 Colostomy status: Secondary | ICD-10-CM | POA: Diagnosis not present

## 2020-12-27 DIAGNOSIS — I5032 Chronic diastolic (congestive) heart failure: Secondary | ICD-10-CM | POA: Diagnosis not present

## 2020-12-27 DIAGNOSIS — I214 Non-ST elevation (NSTEMI) myocardial infarction: Secondary | ICD-10-CM | POA: Diagnosis not present

## 2020-12-27 DIAGNOSIS — I10 Essential (primary) hypertension: Secondary | ICD-10-CM | POA: Diagnosis not present

## 2020-12-27 DIAGNOSIS — I420 Dilated cardiomyopathy: Secondary | ICD-10-CM | POA: Diagnosis not present

## 2020-12-27 DIAGNOSIS — I252 Old myocardial infarction: Secondary | ICD-10-CM | POA: Diagnosis not present

## 2020-12-27 DIAGNOSIS — Z87891 Personal history of nicotine dependence: Secondary | ICD-10-CM | POA: Diagnosis not present

## 2020-12-27 DIAGNOSIS — I3139 Other pericardial effusion (noninflammatory): Secondary | ICD-10-CM | POA: Diagnosis not present

## 2020-12-27 DIAGNOSIS — R110 Nausea: Secondary | ICD-10-CM | POA: Diagnosis not present

## 2020-12-27 DIAGNOSIS — Z8249 Family history of ischemic heart disease and other diseases of the circulatory system: Secondary | ICD-10-CM | POA: Diagnosis not present

## 2020-12-27 DIAGNOSIS — I444 Left anterior fascicular block: Secondary | ICD-10-CM | POA: Diagnosis present

## 2020-12-27 DIAGNOSIS — I272 Pulmonary hypertension, unspecified: Secondary | ICD-10-CM | POA: Diagnosis not present

## 2020-12-27 DIAGNOSIS — E785 Hyperlipidemia, unspecified: Secondary | ICD-10-CM | POA: Diagnosis present

## 2020-12-27 LAB — COMPREHENSIVE METABOLIC PANEL
ALT: 12 U/L (ref 0–44)
AST: 30 U/L (ref 15–41)
Albumin: 2.7 g/dL — ABNORMAL LOW (ref 3.5–5.0)
Alkaline Phosphatase: 45 U/L (ref 38–126)
Anion gap: 9 (ref 5–15)
BUN: 21 mg/dL (ref 8–23)
CO2: 20 mmol/L — ABNORMAL LOW (ref 22–32)
Calcium: 8.3 mg/dL — ABNORMAL LOW (ref 8.9–10.3)
Chloride: 105 mmol/L (ref 98–111)
Creatinine, Ser: 1.31 mg/dL — ABNORMAL HIGH (ref 0.44–1.00)
GFR, Estimated: 41 mL/min — ABNORMAL LOW (ref >60)
Glucose, Bld: 117 mg/dL — ABNORMAL HIGH (ref 70–99)
Potassium: 3.9 mmol/L (ref 3.5–5.1)
Sodium: 134 mmol/L — ABNORMAL LOW (ref 135–145)
Total Bilirubin: 0.5 mg/dL (ref 0.3–1.2)
Total Protein: 5.8 g/dL — ABNORMAL LOW (ref 6.5–8.1)

## 2020-12-27 LAB — CBC
HCT: 40.9 % (ref 36.0–46.0)
Hemoglobin: 13 g/dL (ref 12.0–15.0)
MCH: 24.9 pg — ABNORMAL LOW (ref 26.0–34.0)
MCHC: 31.8 g/dL (ref 30.0–36.0)
MCV: 78.4 fL — ABNORMAL LOW (ref 80.0–100.0)
Platelets: 204 10*3/uL (ref 150–400)
RBC: 5.22 MIL/uL — ABNORMAL HIGH (ref 3.87–5.11)
RDW: 20.2 % — ABNORMAL HIGH (ref 11.5–15.5)
WBC: 7.6 10*3/uL (ref 4.0–10.5)
nRBC: 0 % (ref 0.0–0.2)

## 2020-12-27 LAB — PROTIME-INR
INR: 2.3 — ABNORMAL HIGH (ref 0.8–1.2)
Prothrombin Time: 25 seconds — ABNORMAL HIGH (ref 11.4–15.2)

## 2020-12-27 LAB — GLUCOSE, CAPILLARY
Glucose-Capillary: 116 mg/dL — ABNORMAL HIGH (ref 70–99)
Glucose-Capillary: 114 mg/dL — ABNORMAL HIGH (ref 70–99)
Glucose-Capillary: 100 mg/dL — ABNORMAL HIGH (ref 70–99)
Glucose-Capillary: 98 mg/dL (ref 70–99)

## 2020-12-27 LAB — C-REACTIVE PROTEIN: CRP: 10 mg/dL — ABNORMAL HIGH (ref <1.0)

## 2020-12-27 LAB — MAGNESIUM: Magnesium: 1.8 mg/dL (ref 1.7–2.4)

## 2020-12-27 MED ORDER — MAGNESIUM SULFATE IN D5W 1-5 GM/100ML-% IV SOLN
1.0000 g | Freq: Once | INTRAVENOUS | Status: AC
  Administered 2020-12-27: 05:00:00 1 g via INTRAVENOUS
  Filled 2020-12-27: qty 100

## 2020-12-27 MED ORDER — METOPROLOL SUCCINATE ER 50 MG PO TB24
50.0000 mg | ORAL_TABLET | Freq: Every day | ORAL | Status: DC
  Filled 2020-12-27: qty 1

## 2020-12-27 MED ORDER — POTASSIUM CHLORIDE CRYS ER 20 MEQ PO TBCR
20.0000 meq | EXTENDED_RELEASE_TABLET | Freq: Once | ORAL | Status: AC
  Administered 2020-12-27: 05:00:00 20 meq via ORAL
  Filled 2020-12-27: qty 1

## 2020-12-27 NOTE — Plan of Care (Signed)
°  Problem: Education: Goal: Knowledge of General Education information will improve Description: Including pain rating scale, medication(s)/side effects and non-pharmacologic comfort measures Outcome: Not Progressing   Problem: Health Behavior/Discharge Planning: Goal: Ability to manage health-related needs will improve Outcome: Not Progressing   Problem: Education: Goal: Knowledge of risk factors and measures for prevention of condition will improve Outcome: Not Progressing   Problem: Coping: Goal: Psychosocial and spiritual needs will be supported Outcome: Not Progressing   Problem: Education: Goal: Understanding of cardiac disease, CV risk reduction, and recovery process will improve Outcome: Not Progressing Goal: Individualized Educational Video(s) Outcome: Not Progressing   Problem: Health Behavior/Discharge Planning: Goal: Ability to safely manage health-related needs after discharge will improve Outcome: Not Progressing

## 2020-12-27 NOTE — Progress Notes (Signed)
PROGRESS NOTE        PATIENT DETAILS Name: Hannah [name] Age: 80 y.o. Sex: female Date of Birth: 07-May-1940 Admit Date: 12/25/2020 Admitting Physician Evalee Mutton Kristeen Mans, MD EH:[id number], Steele Berg, MD  Brief Narrative: Patient is a 80 y.o. female with history of PEA arrest due to a large PE in 2014-on anticoagulation with Coumadin, DM-2, HTN, HLD, large bowel stricture-s/p colectomy with ostomy in place-presented to the hospital with chest pain.  See below for further details.  Subjective: No further chest pain.  Lying comfortably in bed.  Objective: Vitals: Blood pressure 111/67, pulse 67, temperature 98.9 F (37.2 C), temperature source Oral, resp. rate 19, height 5\' 7"  (1.702 m), weight 76.4 kg, SpO2 96 %.   Exam: Gen Exam:Alert awake-not in any distress HEENT:atraumatic, normocephalic Chest: B/L clear to auscultation anteriorly CVS:S1S2 regular Abdomen:soft non tender, non distended Extremities:no edema Neurology: Non focal Skin: no rash   Pertinent Labs/Radiology: Recent Labs  Lab 12/27/20 0017  WBC 7.6  HGB 13.0  PLT 204  NA 134*  K 3.9  CREATININE 1.31*  AST 30  ALT 12  ALKPHOS 45  BILITOT 0.5    Assessment/Plan: Non-STEMI: Presented with chest pain/troponin elevation-echo with suppressed EF and numerous wall motion abnormalities.  Given COVID-19 infection-myocarditis also considered.  Will await further recommendations from cardiology.  COVID-19 infection: Appears to be incidental-denies any symptoms.  Was febrile on 12/24-no fever since then.  Since cycle threshold was 23-febrile-was started on Remdesivir.  CXR negative for PNA.  Recent Labs    12/25/20 1708 12/26/20 0741 12/27/20 0017  DDIMER 0.45  --   --   FERRITIN 16  --   --   LDH 178  --   --   CRP 3.0* 9.4* 10.0*    PAF: Occurred last night-back in sinus rhythm-already anticoagulated and on beta-blocker.  Continue telemetry monitoring.  Discussed with  cardiology-Dr. Sherin Quarry goes back into RVR-can start amiodarone.  Will await formal cardiology input.    History of PEA arrest due to massive pulmonary embolism: On Coumadin with therapeutic INR  CKD stage IIIa: Creatinine close to baseline-follow renal function periodically.  Chronic diastolic heart failure: Remains euvolemic.  HTN: Be stable-continue metoprolol/lisinopril.  HLD: Continue statin.  Hypothyroidism: Continue Synthroid  DM-2 (A1c 6.09 August 2020): CBG stable-continue SSI  Recent Labs    12/26/20 2118 12/27/20 0627 12/27/20 1043  GLUCAP 124* 116* 114*     Chronic debility/deconditioning: At baseline-walks with the help of a walker-not very ambulatory.  PT/OT eval will be ordered.  BMI Estimated body mass index is 26.38 kg/m as calculated from the following:   Height as of this encounter: 5\' 7"  (1.702 m).   Weight as of this encounter: 76.4 kg.    Procedures: None Consults: Cardiology DVT Prophylaxis: Coumadin/heparin Code Status:Full code  Family Communication: Daughter-Michele-[phone]-updated over the phone on 12/25  Time spent: 35 minutes-Greater than 50% of this time was spent in counseling, explanation of diagnosis, planning of further management, and coordination of care.   Disposition Plan: Status is: Inpatient.  The patient will require care spanning > 2 midnights and should be moved to inpatient because: Chest pain-possible non-STEMI-COVID-febrile this morning-checking cultures-starting IV Remdesivir-not yet stable for discharge.   Diet: Diet Order             Diet heart healthy/carb modified Room service appropriate? Yes; Fluid  consistency: Thin  Diet effective now                     Antimicrobial agents: Anti-infectives (From admission, onward)    Start     Dose/Rate Route Frequency Ordered Stop   12/27/20 1000  remdesivir 100 mg in sodium chloride 0.9 % 100 mL IVPB       See Hyperspace for full Linked Orders Report.    100 mg 200 mL/hr over 30 Minutes Intravenous Daily 12/26/20 1500 12/31/20 0959   12/26/20 1600  remdesivir 200 mg in sodium chloride 0.9% 250 mL IVPB       See Hyperspace for full Linked Orders Report.   200 mg 580 mL/hr over 30 Minutes Intravenous Once 12/26/20 1500 12/26/20 1700        MEDICATIONS: Scheduled Meds:  vitamin C  500 mg Oral Daily   aspirin EC  81 mg Oral Daily   insulin aspart  0-9 Units Subcutaneous TID WC   levothyroxine  25 mcg Oral QAC breakfast   lisinopril  5 mg Oral Daily   metoprolol tartrate  25 mg Oral BID   pantoprazole  40 mg Oral Daily   simvastatin  20 mg Oral QHS   sodium chloride flush  3 mL Intravenous Q12H   zinc sulfate  220 mg Oral Daily   Continuous Infusions:  sodium chloride     remdesivir 100 mg in NS 100 mL 100 mg (12/27/20 0832)   PRN Meds:.sodium chloride, acetaminophen, albuterol, guaiFENesin-dextromethorphan, ondansetron **OR** ondansetron (ZOFRAN) IV, sodium chloride flush   I have personally reviewed following labs and imaging studies  LABORATORY DATA: CBC: Recent Labs  Lab 12/25/20 1241 12/26/20 0741 12/27/20 0017  WBC 11.0* 9.3 7.6  NEUTROABS 8.6*  --   --   HGB 13.6 12.9 13.0  HCT 44.6 42.4 40.9  MCV 81.4 78.5* 78.4*  PLT 294 229 204     Basic Metabolic Panel: Recent Labs  Lab 12/25/20 1241 12/26/20 0741 12/27/20 0017 12/27/20 0315  NA 135 134* 134*  --   K 5.2* 4.2 3.9  --   CL 103 102 105  --   CO2 23 23 20*  --   GLUCOSE 119* 102* 117*  --   BUN 23 16 21   --   CREATININE 1.32* 1.23* 1.31*  --   CALCIUM 9.0 8.9 8.3*  --   MG  --   --   --  1.8     GFR: Estimated Creatinine Clearance: 37.1 mL/min (A) (by C-G formula based on SCr of 1.31 mg/dL (H)).  Liver Function Tests: Recent Labs  Lab 12/27/20 0017  AST 30  ALT 12  ALKPHOS 45  BILITOT 0.5  PROT 5.8*  ALBUMIN 2.7*   No results for input(s): LIPASE, AMYLASE in the last 168 hours. No results for input(s): AMMONIA in the last 168  hours.  Coagulation Profile: Recent Labs  Lab 12/25/20 1410 12/26/20 0114 12/27/20 0017  INR 2.7* 2.8* 2.3*     Cardiac Enzymes: No results for input(s): CKTOTAL, CKMB, CKMBINDEX, TROPONINI in the last 168 hours.  BNP (last 3 results) No results for input(s): PROBNP in the last 8760 hours.  Lipid Profile: No results for input(s): CHOL, HDL, LDLCALC, TRIG, CHOLHDL, LDLDIRECT in the last 72 hours.  Thyroid Function Tests: No results for input(s): TSH, T4TOTAL, FREET4, T3FREE, THYROIDAB in the last 72 hours.  Anemia Panel: Recent Labs    12/25/20 1708  FERRITIN 16  Urine analysis:    Component Value Date/Time   COLORURINE YELLOW 12/26/2020 1104   APPEARANCEUR CLEAR 12/26/2020 1104   LABSPEC 1.010 12/26/2020 1104   PHURINE 8.5 (H) 12/26/2020 1104   GLUCOSEU NEGATIVE 12/26/2020 1104   HGBUR SMALL (A) 12/26/2020 1104   BILIRUBINUR NEGATIVE 12/26/2020 1104   KETONESUR NEGATIVE 12/26/2020 1104   PROTEINUR NEGATIVE 12/26/2020 1104   UROBILINOGEN 0.2 03/14/2017 1102   NITRITE POSITIVE (A) 12/26/2020 1104   LEUKOCYTESUR NEGATIVE 12/26/2020 1104    Sepsis Labs: Lactic Acid, Venous    Component Value Date/Time   LATICACIDVEN 0.86 12/15/2017 2122    MICROBIOLOGY: Recent Results (from the past 240 hour(s))  Resp Panel by RT-PCR (Flu A&B, Covid) Nasopharyngeal Swab     Status: Abnormal   Collection Time: 12/25/20 12:23 PM   Specimen: Nasopharyngeal Swab; Nasopharyngeal(NP) swabs in vial transport medium  Result Value Ref Range Status   SARS Coronavirus 2 by RT PCR POSITIVE (A) NEGATIVE Final    Comment: (NOTE) SARS-CoV-2 target nucleic acids are DETECTED.  The SARS-CoV-2 RNA is generally detectable in upper respiratory specimens during the acute phase of infection. Positive results are indicative of the presence of the identified virus, but do not rule out bacterial infection or co-infection with other pathogens not detected by the test. Clinical correlation  with patient history and other diagnostic information is necessary to determine patient infection status. The expected result is Negative.  Fact Sheet for Patients: EntrepreneurPulse.com.au  Fact Sheet for Healthcare Providers: IncredibleEmployment.be  This test is not yet approved or cleared by the [place] FDA and  has been authorized for detection and/or diagnosis of SARS-CoV-2 by FDA under an Emergency Use Authorization (EUA).  This EUA will remain in effect (meaning this test can be used) for the duration of  the COVID-19 declaration under Section 564(b)(1) of the A ct, 21 U.S.C. section 360bbb-3(b)(1), unless the authorization is terminated or revoked sooner.     Influenza A by PCR NEGATIVE NEGATIVE Final   Influenza B by PCR NEGATIVE NEGATIVE Final    Comment: (NOTE) The Xpert Xpress SARS-CoV-2/FLU/RSV plus assay is intended as an aid in the diagnosis of influenza from Nasopharyngeal swab specimens and should not be used as a sole basis for treatment. Nasal washings and aspirates are unacceptable for Xpert Xpress SARS-CoV-2/FLU/RSV testing.  Fact Sheet for Patients: EntrepreneurPulse.com.au  Fact Sheet for Healthcare Providers: IncredibleEmployment.be  This test is not yet approved or cleared by the [place] FDA and has been authorized for detection and/or diagnosis of SARS-CoV-2 by FDA under an Emergency Use Authorization (EUA). This EUA will remain in effect (meaning this test can be used) for the duration of the COVID-19 declaration under Section 564(b)(1) of the Act, 21 U.S.C. section 360bbb-3(b)(1), unless the authorization is terminated or revoked.  Performed at [facility], [place] [address], [place], [place] [address]   MRSA Next Gen by PCR, Nasal     Status: None   Collection Time: 12/26/20 12:20 AM   Specimen: Nasal Mucosa; Nasal Swab  Result Value Ref Range Status    MRSA by PCR Next Gen NOT DETECTED NOT DETECTED Final    Comment: (NOTE) The GeneXpert MRSA Assay (FDA approved for NASAL specimens only), is one component of a comprehensive MRSA colonization surveillance program. It is not intended to diagnose MRSA infection nor to guide or monitor treatment for MRSA infections. Test performance is not FDA approved in patients less than 82 years old. Performed at [facility],  [address], [place], [place] [address]     RADIOLOGY STUDIES/RESULTS: DG Chest Port 1 View  Result Date: 12/27/2020 CLINICAL DATA:  Shortness of breath and nausea, COVID positive. EXAM: PORTABLE CHEST 1 VIEW COMPARISON:  12/25/2020. FINDINGS: The heart size and mediastinal contours are within normal limits. Atherosclerotic calcification of the aorta is noted. No consolidation, effusion, or pneumothorax. No acute osseous abnormality. IMPRESSION: No acute cardiopulmonary process. Electronically Signed   By: Thornell Sartorius M.D.   On: 12/27/2020 04:35   DG Chest Port 1 View  Result Date: 12/25/2020 CLINICAL DATA:  Shortness of breath EXAM: PORTABLE CHEST 1 VIEW COMPARISON:  Chest x-ray 12/14/2018 FINDINGS: Heart size and mediastinal contours are within normal limits. No suspicious pulmonary opacities identified. Elevated right hemidiaphragm. No pleural effusion or pneumothorax visualized. No acute osseous abnormality appreciated. IMPRESSION: No acute intrathoracic process identified. Electronically Signed   By: Jannifer Hick M.D.   On: 12/25/2020 12:58   ECHOCARDIOGRAM COMPLETE  Result Date: 12/26/2020    ECHOCARDIOGRAM REPORT   Patient Name:   Hannah [name] Date of Exam: 12/26/2020 Medical Rec #:  [id number]    Height:       67.0 in Accession #:    [id number]   Weight:       168.4 lb Date of Birth:  08/24/1940    BSA:          1.880 m Patient Age:    80 years     BP:           110/56 mmHg Patient Gender: F            HR:           82 bpm. Exam Location:  Inpatient Procedure:  2D Echo, Cardiac Doppler and Color Doppler Indications:    Chest pain  History:        Patient has no prior history of Echocardiogram examinations,                 most recent 04/23/2020. Risk Factors:Hypertension, Diabetes and                 Dyslipidemia. COVID-19. Hx pulmonary embolus.  Sonographer:    Ross Ludwig RDCS (AE) Referring Phys: [id number] Orland Mustard IMPRESSIONS  1. Left ventricular ejection fraction, by estimation, is 30 to 35%. The left ventricle has moderately decreased function. The left ventricle demonstrates regional wall motion abnormalities (see scoring diagram/findings for description). There is mild left ventricular hypertrophy. Left ventricular diastolic parameters are consistent with Grade I diastolic dysfunction (impaired relaxation). Elevated left ventricular end-diastolic pressure.  2. Right ventricular systolic function is normal. The right ventricular size is normal. There is normal pulmonary artery systolic pressure. The estimated right ventricular systolic pressure is 27.2 mmHg.  3. A small pericardial effusion is present. The pericardial effusion is anterior to the right ventricle.  4. The mitral valve is grossly normal. Mild mitral valve regurgitation. Moderate mitral annular calcification.  5. Tricuspid valve regurgitation is moderate.  6. The aortic valve is tricuspid. There is mild calcification of the aortic valve. Aortic valve regurgitation is not visualized. Aortic valve sclerosis/calcification is present, without any evidence of aortic stenosis. Aortic valve mean gradient measures 2.0 mmHg. Comparison(s): Prior images reviewed side by side. LV dysfunction and wall motion abnormalities are new in comparison. FINDINGS  Left Ventricle: Left ventricular ejection fraction, by estimation, is 30 to 35%. The left ventricle has moderately decreased function. The left ventricle demonstrates regional wall motion abnormalities. The left  ventricular internal cavity size was normal in size.  There is mild left ventricular hypertrophy. Left ventricular diastolic parameters are consistent with Grade I diastolic dysfunction (impaired relaxation). Elevated left ventricular end-diastolic pressure.  LV Wall Scoring: The mid and distal anterior septum, entire apex, and mid inferoseptal segment are akinetic. The mid anterolateral segment, mid anterior segment, and mid inferior segment are hypokinetic. The posterior wall, basal anteroseptal segment, basal anterolateral segment, basal anterior segment, basal inferior segment, and basal inferoseptal segment are normal. Right Ventricle: The right ventricular size is normal. No increase in right ventricular wall thickness. Right ventricular systolic function is normal. There is normal pulmonary artery systolic pressure. The tricuspid regurgitant velocity is 2.46 m/s, and  with an assumed right atrial pressure of 3 mmHg, the estimated right ventricular systolic pressure is [id number] mmHg. Left Atrium: Left atrial size was normal in size. Right Atrium: Right atrial size was normal in size. Pericardium: A small pericardial effusion is present. The pericardial effusion is anterior to the right ventricle. Mitral Valve: The mitral valve is grossly normal. Moderate mitral annular calcification. Mild mitral valve regurgitation. MV peak gradient, 3.6 mmHg. The mean mitral valve gradient is 1.0 mmHg. Tricuspid Valve: The tricuspid valve is grossly normal. Tricuspid valve regurgitation is moderate. Aortic Valve: The aortic valve is tricuspid. There is mild calcification of the aortic valve. There is mild aortic valve annular calcification. Aortic valve regurgitation is not visualized. Aortic valve sclerosis/calcification is present, without any evidence of aortic stenosis. Aortic valve mean gradient measures 2.0 mmHg. Aortic valve peak gradient measures 4.5 mmHg. Aortic valve area, by VTI measures 2.38 cm. Pulmonic Valve: The pulmonic valve was grossly normal. Pulmonic valve  regurgitation is trivial. Aorta: The aortic root is normal in size and structure. IAS/Shunts: No atrial level shunt detected by color flow Doppler.  LEFT VENTRICLE PLAX 2D LVIDd:         3.80 cm   Diastology LVIDs:         2.30 cm   LV e' medial:    3.48 cm/s LV PW:         1.30 cm   LV E/e' medial:  14.4 LV IVS:        1.30 cm   LV e' lateral:   4.79 cm/s LVOT diam:     2.00 cm   LV E/e' lateral: 10.5 LV SV:         43 LV SV Index:   23 LVOT Area:     3.14 cm  RIGHT VENTRICLE             IVC RV Basal diam:  3.10 cm     IVC diam: 0.90 cm RV S prime:     19.10 cm/s TAPSE (M-mode): 1.9 cm LEFT ATRIUM             Index        RIGHT ATRIUM           Index LA diam:        2.90 cm 1.54 cm/m   RA Area:     10.30 cm LA Vol (A2C):   33.0 ml 17.55 ml/m  RA Volume:   20.60 ml  10.96 ml/m LA Vol (A4C):   33.5 ml 17.82 ml/m LA Biplane Vol: 34.3 ml 18.25 ml/m  AORTIC VALVE AV Area (Vmax):    2.59 cm AV Area (Vmean):   2.45 cm AV Area (VTI):     2.38 cm AV Vmax:  106.00 cm/s AV Vmean:          69.600 cm/s AV VTI:            0.181 m AV Peak Grad:      4.5 mmHg AV Mean Grad:      2.0 mmHg LVOT Vmax:         87.40 cm/s LVOT Vmean:        54.200 cm/s LVOT VTI:          0.137 m LVOT/AV VTI ratio: 0.76  AORTA Ao Root diam: 2.80 cm Ao Asc diam:  2.10 cm MITRAL VALVE               TRICUSPID VALVE MV Area (PHT): 2.97 cm    TR Peak grad:   24.2 mmHg MV Area VTI:   2.36 cm    TR Vmax:        246.00 cm/s MV Peak grad:  3.6 mmHg MV Mean grad:  1.0 mmHg    SHUNTS MV Vmax:       0.95 m/s    Systemic VTI:  0.14 m MV Vmean:      51.9 cm/s   Systemic Diam: 2.00 cm MV Decel Time: 255 msec MV E velocity: 50.20 cm/s MV A velocity: 78.80 cm/s MV E/A ratio:  0.64 Rozann Lesches MD Electronically signed by Rozann Lesches MD Signature Date/Time: 12/26/2020/4:20:16 PM    Final      LOS: 0 days   Oren Binet, MD  [facility]    To contact the attending provider between 7A-7P or the covering provider during after  hours 7P-7A, please log into the web site [email or web address] and access using universal [place] password for that web site. If you do not have the password, please call the hospital operator.  12/27/2020, 11:23 AM

## 2020-12-27 NOTE — Progress Notes (Addendum)
Earlier EKG tonight showing Mobitz 1 rate 90.  Now pt with A.Fib RVR rate 130.  Most recent BP reading was soft but rechecking now.  Regardless of BP however, I told RN to page cards as far as next steps.  I dont want to just add more of a nodal blocking agent (BB or CCB) given that earlier EKG tonight showed a second degree AV block.  On my review of monitor, pt is mostly running ~110 when in A.Fib, and then will spontaneously convert out back to Mobitz 1 at a rate of 80.  RN is discussing with Dr. Deforest Hoyles now.

## 2020-12-27 NOTE — Progress Notes (Addendum)
Pt in and out of Afib RVR, as high as 140, 143. And down to 90-110's. Notified Dr. Lanae Boast that asked that I contact Dr. Deforest Hoyles.  Spoke to Dr. Deforest Hoyles, discussed with him that Pt has been in 2nd degree heart block and then afib RVR, and then SR, at time of conversation, she was in Afib RVR 90s-130's.  He recommended to get replace potassium <4.0 and Mag <2.0.  to call back if Pt is over 150 and sustaining. And move metoprolol to 0600.  Dr. Lanae Boast on unit. Advised of conversation, will place potassium and await for mag result

## 2020-12-27 NOTE — Plan of Care (Signed)
  Problem: Education: Goal: Knowledge of General Education information will improve Description: Including pain rating scale, medication(s)/side effects and non-pharmacologic comfort measures Outcome: Progressing   Problem: Health Behavior/Discharge Planning: Goal: Ability to manage health-related needs will improve Outcome: Progressing   Problem: Clinical Measurements: Goal: Ability to maintain clinical measurements within normal limits will improve Outcome: Progressing Goal: Diagnostic test results will improve Outcome: Progressing Goal: Respiratory complications will improve Outcome: Progressing   Problem: Activity: Goal: Risk for activity intolerance will decrease Outcome: Progressing   Problem: Nutrition: Goal: Adequate nutrition will be maintained Outcome: Progressing   

## 2020-12-27 NOTE — Progress Notes (Signed)
° °Progress Note ° °Patient Name: Hannah [name] °Date of Encounter: 12/27/2020 ° °Primary Cardiologist: Paula Ross, MD ° °Subjective  ° °No chest pain or breathlessness this morning.  Did experience palpitations overnight with atrial fibrillation. ° °Inpatient Medications  °  °Scheduled Meds: ° vitamin C  500 mg Oral Daily  ° aspirin EC  81 mg Oral Daily  ° insulin aspart  0-9 Units Subcutaneous TID WC  ° levothyroxine  25 mcg Oral QAC breakfast  ° lisinopril  5 mg Oral Daily  ° metoprolol tartrate  25 mg Oral BID  ° pantoprazole  40 mg Oral Daily  ° simvastatin  20 mg Oral QHS  ° sodium chloride flush  3 mL Intravenous Q12H  ° zinc sulfate  220 mg Oral Daily  ° °Continuous Infusions: ° sodium chloride    ° remdesivir 100 mg in NS 100 mL 100 mg (12/27/20 0832)  ° °PRN Meds: °sodium chloride, acetaminophen, albuterol, guaiFENesin-dextromethorphan, ondansetron **OR** ondansetron (ZOFRAN) IV, sodium chloride flush  ° °Vital Signs  °  °Vitals:  ° 12/27/20 0253 12/27/20 0633 12/27/20 0834 12/27/20 1030  °BP: (!) 112/56 102/76 118/76 111/67  °Pulse: 98 85  67  °Resp: 14   19  °Temp: 98.7 °F (37.1 °C)  98.8 °F (37.1 °C) 98.9 °F (37.2 °C)  °TempSrc: Oral  Oral Oral  °SpO2: 96%   96%  °Weight:      °Height:      ° ° °Intake/Output Summary (Last 24 hours) at 12/27/2020 1149 °Last data filed at 12/27/2020 0542 °Gross per 24 hour  °Intake 190.98 ml  °Output 500 ml  °Net -309.02 ml  ° °Filed Weights  ° 12/25/20 1154 12/25/20 1910  °Weight: 79.4 kg 76.4 kg  ° ° °Telemetry  °  °Sinus rhythm.  Personally reviewed. ° °ECG  °  °An ECG dated 12/26/2020 was personally reviewed today and demonstrated:  Sinus rhythm with LVH and repolarization abnormalities, left anterior fascicular block, frequent PACs. ° °Physical Exam  ° °GEN: No acute distress.   °Neck: No JVD. °Cardiac: RRR, no murmur or gallop.  °Respiratory: Nonlabored. Clear to auscultation bilaterally. °GI: Soft, nontender, bowel sounds present. °MS: No edema; No  deformity. °Neuro:  Nonfocal. °Psych: Alert and oriented x 3. Normal affect. ° °Labs  °  °Chemistry °Recent Labs  °Lab 12/25/20 °1241 12/26/20 °0741 12/27/20 °0017  °NA 135 134* 134*  °K 5.2* 4.2 3.9  °CL 103 102 105  °CO2 23 23 20*  °GLUCOSE 119* 102* 117*  °BUN 23 16 21  °CREATININE 1.32* 1.23* 1.31*  °CALCIUM 9.0 8.9 8.3*  °PROT  --   --  5.8*  °ALBUMIN  --   --  2.7*  °AST  --   --  30  °ALT  --   --  12  °ALKPHOS  --   --  45  °BILITOT  --   --  0.5  °GFRNONAA 41* 45* 41*  °ANIONGAP 9 9 9  °  ° °Hematology °Recent Labs  °Lab 12/25/20 °1241 12/26/20 °0741 12/27/20 °0017  °WBC 11.0* 9.3 7.6  °RBC 5.48* 5.40* 5.22*  °HGB 13.6 12.9 13.0  °HCT 44.6 42.4 40.9  °MCV 81.4 78.5* 78.4*  °MCH 24.8* 23.9* 24.9*  °MCHC 30.5 30.4 31.8  °RDW 20.5* 20.1* 20.2*  °PLT 294 229 204  ° ° °Cardiac Enzymes °Recent Labs  °Lab 12/25/20 °1241 12/25/20 °1410 12/25/20 °1700 12/25/20 °1708  °TROPONINIHS 8 77* 1,105* 1,222*  ° ° °BNP °Recent Labs  °Lab 12/25/20 °  12/25/20 1241  BNP 137.4*     DDimer Recent Labs  Lab 12/25/20 1708  DDIMER 0.45     Radiology    DG Chest Port 1 View  Result Date: 12/27/2020 CLINICAL DATA:  Shortness of breath and nausea, COVID positive. EXAM: PORTABLE CHEST 1 VIEW COMPARISON:  12/25/2020. FINDINGS: The heart size and mediastinal contours are within normal limits. Atherosclerotic calcification of the aorta is noted. No consolidation, effusion, or pneumothorax. No acute osseous abnormality. IMPRESSION: No acute cardiopulmonary process. Electronically Signed   By: Brett Fairy M.D.   On: 12/27/2020 04:35   DG Chest Port 1 View  Result Date: 12/25/2020 CLINICAL DATA:  Shortness of breath EXAM: PORTABLE CHEST 1 VIEW COMPARISON:  Chest x-ray 12/14/2018 FINDINGS: Heart size and mediastinal contours are within normal limits. No suspicious pulmonary opacities identified. Elevated right hemidiaphragm. No pleural effusion or pneumothorax visualized. No acute osseous abnormality appreciated. IMPRESSION: No  acute intrathoracic process identified. Electronically Signed   By: Ofilia Neas M.D.   On: 12/25/2020 12:58   ECHOCARDIOGRAM COMPLETE  Result Date: 12/26/2020    ECHOCARDIOGRAM REPORT   Patient Name:   Hannah [name] Date of Exam: 12/26/2020 Medical Rec #:  [id number]    Height:       67.0 in Accession #:    [id number]   Weight:       168.4 lb Date of Birth:  16-Aug-1940    BSA:          1.880 m Patient Age:    80 years     BP:           110/56 mmHg Patient Gender: F            HR:           82 bpm. Exam Location:  Inpatient Procedure: 2D Echo, Cardiac Doppler and Color Doppler Indications:    Chest pain  History:        Patient has no prior history of Echocardiogram examinations,                 most recent 04/23/2020. Risk Factors:Hypertension, Diabetes and                 Dyslipidemia. COVID-19. Hx pulmonary embolus.  Sonographer:    Clayton Lefort RDCS (AE) Referring Phys: [id number] [place]  1. Left ventricular ejection fraction, by estimation, is 30 to 35%. The left ventricle has moderately decreased function. The left ventricle demonstrates regional wall motion abnormalities (see scoring diagram/findings for description). There is mild left ventricular hypertrophy. Left ventricular diastolic parameters are consistent with Grade I diastolic dysfunction (impaired relaxation). Elevated left ventricular end-diastolic pressure.  2. Right ventricular systolic function is normal. The right ventricular size is normal. There is normal pulmonary artery systolic pressure. The estimated right ventricular systolic pressure is 50.2 mmHg.  3. A small pericardial effusion is present. The pericardial effusion is anterior to the right ventricle.  4. The mitral valve is grossly normal. Mild mitral valve regurgitation. Moderate mitral annular calcification.  5. Tricuspid valve regurgitation is moderate.  6. The aortic valve is tricuspid. There is mild calcification of the aortic valve. Aortic valve regurgitation  is not visualized. Aortic valve sclerosis/calcification is present, without any evidence of aortic stenosis. Aortic valve mean gradient measures 2.0 mmHg. Comparison(s): Prior images reviewed side by side. LV dysfunction and wall motion abnormalities are new in comparison. FINDINGS  Left Ventricle: Left ventricular ejection fraction, by estimation, is 30 to 35%. The left  has moderately decreased function. The left ventricle demonstrates regional wall motion abnormalities. The left ventricular internal cavity size was normal in size. There is mild left ventricular hypertrophy. Left ventricular diastolic parameters are consistent with Grade I diastolic dysfunction (impaired relaxation). Elevated left ventricular end-diastolic pressure.  LV Wall Scoring: The mid and distal anterior septum, entire apex, and mid inferoseptal segment are akinetic. The mid anterolateral segment, mid anterior segment, and mid inferior segment are hypokinetic. The posterior wall, basal anteroseptal segment, basal anterolateral segment, basal anterior segment, basal inferior segment, and basal inferoseptal segment are normal. Right Ventricle: The right ventricular size is normal. No increase in right ventricular wall thickness. Right ventricular systolic function is normal. There is normal pulmonary artery systolic pressure. The tricuspid regurgitant velocity is 2.46 m/s, and  with an assumed right atrial pressure of 3 mmHg, the estimated right ventricular systolic pressure is 27.2 mmHg. Left Atrium: Left atrial size was normal in size. Right Atrium: Right atrial size was normal in size. Pericardium: A small pericardial effusion is present. The pericardial effusion is anterior to the right ventricle. Mitral Valve: The mitral valve is grossly normal. Moderate mitral annular calcification. Mild mitral valve regurgitation. MV peak gradient, 3.6 mmHg. The mean mitral valve gradient is 1.0 mmHg. Tricuspid Valve: The tricuspid valve is  grossly normal. Tricuspid valve regurgitation is moderate. Aortic Valve: The aortic valve is tricuspid. There is mild calcification of the aortic valve. There is mild aortic valve annular calcification. Aortic valve regurgitation is not visualized. Aortic valve sclerosis/calcification is present, without any evidence of aortic stenosis. Aortic valve mean gradient measures 2.0 mmHg. Aortic valve peak gradient measures 4.5 mmHg. Aortic valve area, by VTI measures 2.38 cm². Pulmonic Valve: The pulmonic valve was grossly normal. Pulmonic valve regurgitation is trivial. Aorta: The aortic root is normal in size and structure. IAS/Shunts: No atrial level shunt detected by color flow Doppler.  LEFT VENTRICLE PLAX 2D LVIDd:         3.80 cm   Diastology LVIDs:         2.30 cm   LV e' medial:    3.48 cm/s LV PW:         1.30 cm   LV E/e' medial:  14.4 LV IVS:        1.30 cm   LV e' lateral:   4.79 cm/s LVOT diam:     2.00 cm   LV E/e' lateral: 10.5 LV SV:         43 LV SV Index:   23 LVOT Area:     3.14 cm²  RIGHT VENTRICLE             IVC RV Basal diam:  3.10 cm     IVC diam: 0.90 cm RV S prime:     19.10 cm/s TAPSE (M-mode): 1.9 cm LEFT ATRIUM             Index        RIGHT ATRIUM           Index LA diam:        2.90 cm 1.54 cm/m²   RA Area:     10.30 cm² LA Vol (A2C):   33.0 ml 17.55 ml/m²  RA Volume:   20.60 ml  10.96 ml/m² LA Vol (A4C):   33.5 ml 17.82 ml/m² LA Biplane Vol: 34.3 ml 18.25 ml/m²  AORTIC VALVE AV Area (Vmax):    2.59 cm² AV Area (Vmean):   2.45 cm² AV Area (VTI):       2.38 cm² AV Vmax:           106.00 cm/s AV Vmean:          69.600 cm/s AV VTI:            0.181 m AV Peak Grad:      4.5 mmHg AV Mean Grad:      2.0 mmHg LVOT Vmax:         87.40 cm/s LVOT Vmean:        54.200 cm/s LVOT VTI:          0.137 m LVOT/AV VTI ratio: 0.76  AORTA Ao Root diam: 2.80 cm Ao Asc diam:  2.10 cm MITRAL VALVE               TRICUSPID VALVE MV Area (PHT): 2.97 cm²    TR Peak grad:   24.2 mmHg MV Area VTI:   2.36 cm²    TR Vmax:         246.00 cm/s MV Peak grad:  3.6 mmHg MV Mean grad:  1.0 mmHg    SHUNTS MV Vmax:       0.95 m/s    Systemic VTI:  0.14 m MV Vmean:      51.9 cm/s   Systemic Diam: 2.00 cm MV Decel Time: 255 msec MV E velocity: 50.20 cm/s MV A velocity: 78.80 cm/s MV E/A ratio:  0.64   MD Electronically signed by   MD Signature Date/Time: 12/26/2020/4:20:16 PM    Final    ° °Assessment & Plan  °  °1.  Chest pain with elevated high-sensitivity troponin I, most recent level up to 1222.  There was initial concern for possible pericarditis although this does not necessarily look to be the case.  ESR normal and CRP 10.  Follow-up echocardiogram reveals LVEF 30 to 35% range with wall motion abnormalities suggestive of ischemic cardiomyopathy.  She does have a small pericardial effusion.  Prior history of CAD but cardiac risk factors including type 2 diabetes mellitus, hypertension, and hyperlipidemia.  With concurrent COVID-19 infection, also need to consider myocarditis. ° °2.  Paroxysmal atrial fibrillation per review of the chart.  Palpitations overnight.  She is in sinus rhythm this morning.  There is mention of concern for second-degree heart block per ECG, I reviewed these tracings and they show frequent PACs, not second-degree heart block. ° °3.  COVID-19 positive, chest x-ray shows no infiltrates but she did have a fever yesterday.  She is currently on remdesivir. ° °4.  Prior history of DVT and pulmonary embolus, on Coumadin per pharmacy with therapeutic INR of 2.3. ° °5.  CKD stage IIIb, creatinine 1.31. ° °Echocardiogram now more concerning for possible ischemic cardiomyopathy although myocarditis is also still possible with COVID-19.  Plan is to transition from Coumadin to heparin in anticipation ultimately of a diagnostic cardiac catheterization.  Continue aspirin, lisinopril, and Zocor for now.  Change from Lopressor to Toprol-XL, will start dose tomorrow.  If she manifests recurring atrial  fibrillation may need to consider limited course of amiodarone. ° °Signed, ° , MD  °12/27/2020, 11:49 AM    °

## 2020-12-27 NOTE — Progress Notes (Signed)
ANTICOAGULATION CONSULT NOTE - Follow up [place] for warfarin > heparin  Indication: pulmonary embolus  Allergies  Allergen Reactions   Augmentin [Amoxicillin-Pot Clavulanate] Itching and Other (See Comments)    Severe vaginal itching   Tranxene [Clorazepate] Itching   Crestor [Rosuvastatin]     Made her sick on stomach, she is able to tolerate the zocor   Penicillins Itching and Rash    Has patient had a PCN reaction causing immediate rash, facial/tongue/throat swelling, SOB or lightheadedness with hypotension: Yes Has patient had a PCN reaction causing severe rash involving mucus membranes or skin necrosis: No Has patient had a PCN reaction that required hospitalization: No Has patient had a PCN reaction occurring within the last 10 years: No If all of the above answers are "NO", then may proceed with Cephalosporin use.     Patient Measurements: Height: '5\' 7"'  (170.2 cm) Weight: 76.4 kg (168 lb 6.9 oz) IBW/kg (Calculated) : 61.6  Vital Signs: Temp: 98.9 F (37.2 C) (12/25 1030) Temp Source: Oral (12/25 1030) BP: 111/67 (12/25 1030) Pulse Rate: 67 (12/25 1030)  Labs: Recent Labs    12/25/20 1241 12/25/20 1410 12/25/20 1700 12/25/20 1708 12/26/20 0114 12/26/20 0741 12/27/20 0017  HGB 13.6  --   --   --   --  12.9 13.0  HCT 44.6  --   --   --   --  42.4 40.9  PLT 294  --   --   --   --  229 204  LABPROT  --  28.5*  --   --  29.6*  --  25.0*  INR  --  2.7*  --   --  2.8*  --  2.3*  CREATININE 1.32*  --   --   --   --  1.23* 1.31*  TROPONINIHS 8 77* 1,105* 1,222*  --   --   --      Estimated Creatinine Clearance: 37.1 mL/min (A) (by C-G formula based on SCr of 1.31 mg/dL (H)).   Medical History: Past Medical History:  Diagnosis Date   Acute massive pulmonary embolism ([place]) 07/13/2012   Massive PE w/ PEA arrest 07/13/12 >TNK >IVC filter >discharged on comadin     Anticoagulated on warfarin    Chronic diastolic CHF (congestive heart failure)  ([place]) 03/15/2017   Chronic kidney disease, stage 3a ([place]) 12/15/2018   Diabetes mellitus, type 2 ([place]) 07/15/2012   with peripheral neuropathy   DM2 (diabetes mellitus, type 2) ([place]) 07/23/2012   Hyperlipemia 07/14/2012   Hyperlipidemia 02/25/2013   Hypertension 07/14/2012   Hypothyroid 06/06/2013   Large bowel stricture ([place])    s/p colectomy in 2016   Neuropathy 02/12/2015   Osteoarthritis    s/p hip and knee replacements   Pulmonary hypertension ([place]) 07/26/2016   Syncope 10/2018   Thyroid disease     Medications:  Medications Prior to Admission  Medication Sig Dispense Refill Last Dose   acetaminophen (TYLENOL) 500 MG tablet Take 500 mg by mouth every 6 (six) hours as needed for mild pain.    unk   diclofenac Sodium (VOLTAREN) 1 % GEL Apply 4 g topically 4 (four) times daily. (Patient taking differently: Apply 4 g topically daily as needed.)   unk   furosemide (LASIX) 20 MG tablet Take 1 tablet (20 mg total) by mouth daily as needed for fluid or edema. 30 tablet 3    levothyroxine (EUTHYROX) 25 MCG tablet Take 1 tablet (25 mcg total) by mouth daily  before breakfast. 90 tablet 1 12/25/2020   lisinopril (ZESTRIL) 5 MG tablet Take 1 tablet (5 mg total) by mouth daily. 90 tablet 1 12/25/2020   metFORMIN (GLUCOPHAGE-XR) 500 MG 24 hr tablet TAKE 1 TABLET BY MOUTH ONCE DAILY WITH BREAKFAST. PATIENT NEEDS AN APPT. 90 tablet 1 12/25/2020   metoprolol tartrate (LOPRESSOR) 25 MG tablet Take 1 tablet (25 mg total) by mouth 2 (two) times daily. 180 tablet 1 12/25/2020 at 0700   pantoprazole (PROTONIX) 40 MG tablet Take 1 tablet (40 mg total) by mouth daily. 30 tablet 1 12/25/2020   simvastatin (ZOCOR) 20 MG tablet Take 1 tablet (20 mg total) by mouth at bedtime. 90 tablet 1 12/24/2020   vitamin B-12 (CYANOCOBALAMIN) 1000 MCG tablet Take 1 tablet (1,000 mcg total) by mouth daily.   12/24/2020   vitamin C (ASCORBIC ACID) 500 MG tablet Take 500 mg by mouth daily.    12/24/2020   warfarin (COUMADIN) 5 MG  tablet TAKE 1/2 TO 1 (ONE-HALF TO ONE) TABLET BY MOUTH DAILY AS DIRECTED BY  COUMADIN  CLINIC (Patient taking differently: Take 2.5-5 mg by mouth See admin instructions. Taking 1/2 tablet (2.5 mg) Tues, thur, Sat & Sunday, and taking 1 tablet ( 9m) on Monday, Wed, & Friday.) 80 tablet 1 12/24/2020 at 1700   Blood Glucose Monitoring Suppl (ONETOUCH VERIO) w/Device KIT 1 each by Does not apply route as directed. 1 kit 0    glucose blood (ONETOUCH VERIO) test strip USE 1 STRIP TO CHECK GLUCOSE ONCE DAILY AS DIRECTED 100 each 3    OneTouch Delica Lancets 341DMISC USE 1  TO CHECK GLUCOSE ONCE DAILY 100 each 3     Assessment: 89YOF with h/o of PE on warfarin at home. INR on admission is therapeutic at 2.7 >2.3 after holding warfarin    H/H and Plt wnl.  Planning to switch warfarin to heparin for ACS work up - new low EF 35%  Home warfarin: 5 mg MWF; 2.5 mg on all other days   Goal of Therapy:  INR 2.5-3.5   per outpatient records  Heparin level 0.3-0.7 Monitor platelets by anticoagulation protocol: Yes   Plan:  -holding warfarin Begin heparin when INR <2 -F/u daily INR and CBC  -Monitor for s/s of bleeding   BAlbertina Parr PharmD., BCPS, BCCCP Clinical Pharmacist Please refer to [facility]for [facility]-specific pharmacist    LBonnita NasutiPharm.D. CPP, BCPS Clinical Pharmacist [phone]/25/2022 2:55 PM   Please [facility] to [facility]for [facility] pharmacist

## 2020-12-28 DIAGNOSIS — I214 Non-ST elevation (NSTEMI) myocardial infarction: Secondary | ICD-10-CM | POA: Diagnosis not present

## 2020-12-28 LAB — BASIC METABOLIC PANEL
Anion gap: 10 (ref 5–15)
BUN: 38 mg/dL — ABNORMAL HIGH (ref 8–23)
CO2: 18 mmol/L — ABNORMAL LOW (ref 22–32)
Calcium: 7.9 mg/dL — ABNORMAL LOW (ref 8.9–10.3)
Chloride: 107 mmol/L (ref 98–111)
Creatinine, Ser: 1.46 mg/dL — ABNORMAL HIGH (ref 0.44–1.00)
GFR, Estimated: 36 mL/min — ABNORMAL LOW (ref >60)
Glucose, Bld: 107 mg/dL — ABNORMAL HIGH (ref 70–99)
Potassium: 4.2 mmol/L (ref 3.5–5.1)
Sodium: 135 mmol/L (ref 135–145)

## 2020-12-28 LAB — GLUCOSE, CAPILLARY
Glucose-Capillary: 101 mg/dL — ABNORMAL HIGH (ref 70–99)
Glucose-Capillary: 105 mg/dL — ABNORMAL HIGH (ref 70–99)
Glucose-Capillary: 119 mg/dL — ABNORMAL HIGH (ref 70–99)
Glucose-Capillary: 124 mg/dL — ABNORMAL HIGH (ref 70–99)

## 2020-12-28 LAB — COMPREHENSIVE METABOLIC PANEL
ALT: 13 U/L (ref 0–44)
AST: 30 U/L (ref 15–41)
Albumin: 2.6 g/dL — ABNORMAL LOW (ref 3.5–5.0)
Alkaline Phosphatase: 42 U/L (ref 38–126)
Anion gap: 10 (ref 5–15)
BUN: 30 mg/dL — ABNORMAL HIGH (ref 8–23)
CO2: 18 mmol/L — ABNORMAL LOW (ref 22–32)
Calcium: 8 mg/dL — ABNORMAL LOW (ref 8.9–10.3)
Chloride: 105 mmol/L (ref 98–111)
Creatinine, Ser: 1.47 mg/dL — ABNORMAL HIGH (ref 0.44–1.00)
GFR, Estimated: 36 mL/min — ABNORMAL LOW (ref >60)
Glucose, Bld: 92 mg/dL (ref 70–99)
Potassium: 4.4 mmol/L (ref 3.5–5.1)
Sodium: 133 mmol/L — ABNORMAL LOW (ref 135–145)
Total Bilirubin: 0.4 mg/dL (ref 0.3–1.2)
Total Protein: 5.8 g/dL — ABNORMAL LOW (ref 6.5–8.1)

## 2020-12-28 LAB — CBC
HCT: 43.2 % (ref 36.0–46.0)
Hemoglobin: 13.6 g/dL (ref 12.0–15.0)
MCH: 24.6 pg — ABNORMAL LOW (ref 26.0–34.0)
MCHC: 31.5 g/dL (ref 30.0–36.0)
MCV: 78.1 fL — ABNORMAL LOW (ref 80.0–100.0)
Platelets: 231 10*3/uL (ref 150–400)
RBC: 5.53 MIL/uL — ABNORMAL HIGH (ref 3.87–5.11)
RDW: 20.5 % — ABNORMAL HIGH (ref 11.5–15.5)
WBC: 6.7 10*3/uL (ref 4.0–10.5)
nRBC: 0 % (ref 0.0–0.2)

## 2020-12-28 LAB — PROTIME-INR
INR: 1.8 — ABNORMAL HIGH (ref 0.8–1.2)
Prothrombin Time: 21.1 seconds — ABNORMAL HIGH (ref 11.4–15.2)

## 2020-12-28 LAB — MAGNESIUM: Magnesium: 2 mg/dL (ref 1.7–2.4)

## 2020-12-28 LAB — HEPARIN LEVEL (UNFRACTIONATED)
Heparin Unfractionated: 0.74 IU/mL — ABNORMAL HIGH (ref 0.30–0.70)
Heparin Unfractionated: >1.1 IU/mL — ABNORMAL HIGH (ref 0.30–0.70)

## 2020-12-28 MED ORDER — SODIUM CHLORIDE 0.9 % IV SOLN: INTRAVENOUS | Status: DC

## 2020-12-28 MED ORDER — HEPARIN (PORCINE) 25000 UT/250ML-% IV SOLN
600.0000 [IU]/h | INTRAVENOUS | Status: DC
  Filled 2020-12-28: qty 250

## 2020-12-28 MED ORDER — SODIUM CHLORIDE 0.9 % IV SOLN: Freq: Once | INTRAVENOUS | Status: AC

## 2020-12-28 MED ORDER — SODIUM CHLORIDE 0.9 % IV BOLUS: 250.0000 mL | Freq: Once | INTRAVENOUS | Status: DC

## 2020-12-28 MED ORDER — MIDODRINE HCL 5 MG PO TABS: 5.0000 mg | ORAL_TABLET | Freq: Three times a day (TID) | ORAL | Status: DC

## 2020-12-28 MED ORDER — HEPARIN (PORCINE) 25000 UT/250ML-% IV SOLN
850.0000 [IU]/h | INTRAVENOUS | Status: DC
  Administered 2020-12-28: 04:00:00 900 [IU]/h via INTRAVENOUS
  Filled 2020-12-28: qty 250

## 2020-12-28 MED ORDER — ATORVASTATIN CALCIUM 40 MG PO TABS
40.0000 mg | ORAL_TABLET | Freq: Every day | ORAL | Status: DC
  Administered 2020-12-28 – 2021-01-04 (×7): 40 mg via ORAL
  Filled 2020-12-28 (×7): qty 1

## 2020-12-28 MED ORDER — METOPROLOL SUCCINATE ER 25 MG PO TB24
12.5000 mg | ORAL_TABLET | Freq: Every day | ORAL | Status: DC
  Administered 2020-12-30 – 2020-12-31 (×2): 12.5 mg via ORAL
  Filled 2020-12-28 (×3): qty 1

## 2020-12-28 NOTE — Plan of Care (Signed)
°  Problem: Education: Goal: Knowledge of General Education information will improve Description: Including pain rating scale, medication(s)/side effects and non-pharmacologic comfort measures Outcome: Progressing   Problem: Health Behavior/Discharge Planning: Goal: Ability to manage health-related needs will improve Outcome: Progressing   Problem: Clinical Measurements: Goal: Ability to maintain clinical measurements within normal limits will improve Outcome: Progressing Goal: Will remain free from infection Outcome: Progressing Goal: Diagnostic test results will improve Outcome: Progressing Goal: Respiratory complications will improve Outcome: Progressing Goal: Cardiovascular complication will be avoided Outcome: Progressing   Problem: Activity: Goal: Risk for activity intolerance will decrease Outcome: Progressing   Problem: Nutrition: Goal: Adequate nutrition will be maintained Outcome: Progressing   Problem: Coping: Goal: Level of anxiety will decrease Outcome: Progressing   Problem: Elimination: Goal: Will not experience complications related to bowel motility Outcome: Progressing Goal: Will not experience complications related to urinary retention Outcome: Progressing   Problem: Pain Managment: Goal: General experience of comfort will improve Outcome: Progressing   Problem: Safety: Goal: Ability to remain free from injury will improve Outcome: Progressing   Problem: Skin Integrity: Goal: Risk for impaired skin integrity will decrease Outcome: Progressing   Problem: Education: Goal: Knowledge of risk factors and measures for prevention of condition will improve Outcome: Progressing   Problem: Coping: Goal: Psychosocial and spiritual needs will be supported Outcome: Progressing   Problem: Respiratory: Goal: Will maintain a patent airway Outcome: Progressing Goal: Complications related to the disease process, condition or treatment will be avoided or  minimized Outcome: Progressing   Problem: Education: Goal: Understanding of cardiac disease, CV risk reduction, and recovery process will improve Outcome: Progressing Goal: Individualized Educational Video(s) Outcome: Progressing   Problem: Activity: Goal: Ability to tolerate increased activity will improve Outcome: Progressing   Problem: Cardiac: Goal: Ability to achieve and maintain adequate cardiovascular perfusion will improve Outcome: Progressing   Problem: Health Behavior/Discharge Planning: Goal: Ability to safely manage health-related needs after discharge will improve Outcome: Progressing

## 2020-12-28 NOTE — Progress Notes (Signed)
ANTICOAGULATION CONSULT NOTE - Follow Up Consult  Pharmacy Consult for heparin Indication:  h/o VTE  Labs: Recent Labs    12/26/20 0114 12/26/20 0741 12/26/20 0741 12/27/20 0017 12/28/20 0145 12/28/20 1201 12/28/20 2239  HGB  --  12.9   < > 13.0 13.6  --   --   HCT  --  42.4  --  40.9 43.2  --   --   PLT  --  229  --  204 231  --   --   LABPROT 29.6*  --   --  25.0* 21.1*  --   --   INR 2.8*  --   --  2.3* 1.8*  --   --   HEPARINUNFRC  --   --   --   --   --  0.74* >1.10*  CREATININE  --  1.23*   < > 1.31* 1.47*  --  1.46*   < > = values in this interval not displayed.     Assessment: 80yo female remains supratherapeutic on heparin with higher level despite rate change; no infusion issues or signs of bleeding per RN.  Goal of Therapy:  Heparin level 0.3-0.7 units/ml   Plan:  Will hold heparin x1h then decrease heparin infusion by 4 units/kgABW/hr to 600 units/hr and check level in 8 hours.      Vernard Gambles, PharmD, BCPS  12/28/2020,11:38 PM

## 2020-12-28 NOTE — Progress Notes (Signed)
Mobility Specialist Progress Note    12/28/20 1117  Mobility  Activity Transferred:  Bed to chair  Level of Assistance Minimal assist, patient does 75% or more  Assistive Device Front wheel walker  Distance Ambulated (ft) 4 ft  Mobility Out of bed to chair with meals  Mobility Response Tolerated well  Mobility performed by Mobility specialist  Bed Position Chair  $Mobility charge 1 Mobility   Post-mobility: 100/75 BP  Pt received in bed and agreeable. C/o some general weakness. Left in chair with call bell in reach.  [facility] Mobility Specialist  M.S. Primary Phone: [phone] M.S. Secondary Phone: [phone]

## 2020-12-28 NOTE — TOC Progression Note (Signed)
Transition of Care [facility]) - Progression Note    Patient Details  Name: Hannah [name] MRN: [id number] Date of Birth: 09-26-1940  Transition of Care [facility]) CM/SW Contact  Leone Haven, RN Phone Number: 12/28/2020, 4:05 PM  Clinical Narrative:     Transition of Care [facility]) Screening Note   Patient Details  Name: Hannah [name] Date of Birth: [birth year]   Transition of Care [facility]) CM/SW Contact:    Leone Haven, RN Phone Number: 12/28/2020, 4:05 PM    Transition of Care Department [facility]) has reviewed patient and no TOC needs have been identified at this time. We will continue to monitor patient advancement through interdisciplinary progression rounds. If new patient transition needs arise, please place a TOC consult.          Expected Discharge Plan and Services                                                 Social Determinants of Health (SDOH) Interventions    Readmission Risk Interventions No flowsheet data found.

## 2020-12-28 NOTE — Plan of Care (Signed)
  Problem: Education: Goal: Knowledge of General Education information will improve Description: Including pain rating scale, medication(s)/side effects and non-pharmacologic comfort measures Outcome: Progressing   Problem: Health Behavior/Discharge Planning: Goal: Ability to manage health-related needs will improve Outcome: Progressing   Problem: Clinical Measurements: Goal: Ability to maintain clinical measurements within normal limits will improve Outcome: Progressing Goal: Will remain free from infection Outcome: Progressing Goal: Respiratory complications will improve Outcome: Progressing   Problem: Activity: Goal: Risk for activity intolerance will decrease Outcome: Progressing   Problem: Nutrition: Goal: Adequate nutrition will be maintained Outcome: Progressing   Problem: Pain Managment: Goal: General experience of comfort will improve Outcome: Progressing   

## 2020-12-28 NOTE — Progress Notes (Signed)
ANTICOAGULATION CONSULT NOTE - Follow Up Consult  Pharmacy Consult for heparin Indication:  h/o VTE  Labs: Recent Labs    12/25/20 1410 12/25/20 1700 12/25/20 1708 12/26/20 0114 12/26/20 0741 12/27/20 0017 12/28/20 0145  HGB  --   --   --   --  12.9 13.0 13.6  HCT  --   --   --   --  42.4 40.9 43.2  PLT  --   --   --   --  229 204 231  LABPROT 28.5*  --   --  29.6*  --  25.0* 21.1*  INR 2.7*  --   --  2.8*  --  2.3* 1.8*  CREATININE  --   --   --   --  1.23* 1.31* 1.47*  TROPONINIHS 77* 1,105* 1,222*  --   --   --   --     Assessment: 80yo female to begin heparin now that INR <2.  Goal of Therapy:  Heparin level 0.3-0.7 units/ml   Plan:  Will start infusion at 900 units/hr and monitor heparin levels and CBC.    Vernard Gambles, PharmD, BCPS  12/28/2020,3:31 AM

## 2020-12-28 NOTE — Progress Notes (Signed)
Progress Note  Patient Name: Hannah [name] Date of Encounter: 12/28/2020  Primary Cardiologist: Dorris Carnes, MD  Subjective   Pt denies CP   Breathing is fair  Inpatient Medications    Scheduled Meds:  vitamin C  500 mg Oral Daily   aspirin EC  81 mg Oral Daily   insulin aspart  0-9 Units Subcutaneous TID WC   levothyroxine  25 mcg Oral QAC breakfast   lisinopril  5 mg Oral Daily   metoprolol succinate  50 mg Oral Daily   metoprolol tartrate  25 mg Oral BID   pantoprazole  40 mg Oral Daily   simvastatin  20 mg Oral QHS   sodium chloride flush  3 mL Intravenous Q12H   zinc sulfate  220 mg Oral Daily   Continuous Infusions:  sodium chloride     heparin 900 Units/hr (12/28/20 0415)   remdesivir 100 mg in NS 100 mL 100 mg (12/27/20 0832)    Vital Signs    Vitals:   12/27/20 2252 12/28/20 0100 12/28/20 0316 12/28/20 0707  BP: (!) 93/58  (!) 86/49 (!) 102/59  Pulse: 79 82 81 86  Resp: 16 17 19 17   Temp:   97.6 F (36.4 C) 98 F (36.7 C)  TempSrc:   Oral Oral  SpO2: 97%  97% 97%  Weight:      Height:        Intake/Output Summary (Last 24 hours) at 12/28/2020 0920 Last data filed at 12/28/2020 0707 Gross per 24 hour  Intake 137.21 ml  Output 400 ml  Net -262.79 ml   Filed Weights   12/25/20 1154 12/25/20 1910  Weight: 79.4 kg 76.4 kg    Telemetry    SR/ST  Personally reviewed.  ECG     Physical Exam   GEN: No acute distress.  Eatinglunch Neck: No JVD. Cardiac: RRR, no murmur No rub Respiratory:Mild rhonchi GI: Soft, nontender, bowel sounds present. MS: No edema; No deformity. Neuro:  Nonfocal. Psych: Alert and oriented x 3. =  Labs    Chemistry Recent Labs  Lab 12/26/20 0741 12/27/20 0017 12/28/20 0145  NA 134* 134* 133*  K 4.2 3.9 4.4  CL 102 105 105  CO2 23 20* 18*  GLUCOSE 102* 117* 92  BUN 16 21 30*  CREATININE 1.23* 1.31* 1.47*  CALCIUM 8.9 8.3* 8.0*  PROT  --  5.8* 5.8*  ALBUMIN  --  2.7* 2.6*  AST  --  30 30  ALT  --   12 13  ALKPHOS  --  45 42  BILITOT  --  0.5 0.4  GFRNONAA 45* 41* 36*  ANIONGAP 9 9 10      Hematology Recent Labs  Lab 12/26/20 0741 12/27/20 0017 12/28/20 0145  WBC 9.3 7.6 6.7  RBC 5.40* 5.22* 5.53*  HGB 12.9 13.0 13.6  HCT 42.4 40.9 43.2  MCV 78.5* 78.4* 78.1*  MCH 23.9* 24.9* 24.6*  MCHC 30.4 31.8 31.5  RDW 20.1* 20.2* 20.5*  PLT 229 204 231    Cardiac Enzymes Recent Labs  Lab 12/25/20 1241 12/25/20 1410 12/25/20 1700 12/25/20 1708  TROPONINIHS 8 77* 1,105* 1,222*    BNP Recent Labs  Lab 12/25/20 1241  BNP 137.4*     DDimer Recent Labs  Lab 12/25/20 1708  DDIMER 0.45     Radiology    DG Chest Port 1 View  Result Date: 12/27/2020 CLINICAL DATA:  Shortness of breath and nausea, COVID positive. EXAM: PORTABLE CHEST 1 VIEW COMPARISON:  12/25/2020. FINDINGS: The heart size and mediastinal contours are within normal limits. Atherosclerotic calcification of the aorta is noted. No consolidation, effusion, or pneumothorax. No acute osseous abnormality. IMPRESSION: No acute cardiopulmonary process. Electronically Signed   By: Thornell Sartorius M.D.   On: 12/27/2020 04:35   ECHOCARDIOGRAM COMPLETE  Result Date: 12/26/2020    ECHOCARDIOGRAM REPORT   Patient Name:   Hannah [name] Date of Exam: 12/26/2020 Medical Rec #:  [id number]    Height:       67.0 in Accession #:    [id number]   Weight:       168.4 lb Date of Birth:  1940-04-10    BSA:          1.880 m Patient Age:    80 years     BP:           110/56 mmHg Patient Gender: F            HR:           82 bpm. Exam Location:  Inpatient Procedure: 2D Echo, Cardiac Doppler and Color Doppler Indications:    Chest pain  History:        Patient has no prior history of Echocardiogram examinations,                 most recent 04/23/2020. Risk Factors:Hypertension, Diabetes and                 Dyslipidemia. COVID-19. Hx pulmonary embolus.  Sonographer:     Ludwig RDCS (AE) Referring Phys: [id number] Orland Mustard IMPRESSIONS  1.  Left ventricular ejection fraction, by estimation, is 30 to 35%. The left ventricle has moderately decreased function. The left ventricle demonstrates regional wall motion abnormalities (see scoring diagram/findings for description). There is mild left ventricular hypertrophy. Left ventricular diastolic parameters are consistent with Grade I diastolic dysfunction (impaired relaxation). Elevated left ventricular end-diastolic pressure.  2. Right ventricular systolic function is normal. The right ventricular size is normal. There is normal pulmonary artery systolic pressure. The estimated right ventricular systolic pressure is 27.2 mmHg.  3. A small pericardial effusion is present. The pericardial effusion is anterior to the right ventricle.  4. The mitral valve is grossly normal. Mild mitral valve regurgitation. Moderate mitral annular calcification.  5. Tricuspid valve regurgitation is moderate.  6. The aortic valve is tricuspid. There is mild calcification of the aortic valve. Aortic valve regurgitation is not visualized. Aortic valve sclerosis/calcification is present, without any evidence of aortic stenosis. Aortic valve mean gradient measures 2.0 mmHg. Comparison(s): Prior images reviewed side by side. LV dysfunction and wall motion abnormalities are new in comparison. FINDINGS  Left Ventricle: Left ventricular ejection fraction, by estimation, is 30 to 35%. The left ventricle has moderately decreased function. The left ventricle demonstrates regional wall motion abnormalities. The left ventricular internal cavity size was normal in size. There is mild left ventricular hypertrophy. Left ventricular diastolic parameters are consistent with Grade I diastolic dysfunction (impaired relaxation). Elevated left ventricular end-diastolic pressure.  LV Wall Scoring: The mid and distal anterior septum, entire apex, and mid inferoseptal segment are akinetic. The mid anterolateral segment, mid anterior segment, and mid  inferior segment are hypokinetic. The posterior wall, basal anteroseptal segment, basal anterolateral segment, basal anterior segment, basal inferior segment, and basal inferoseptal segment are normal. Right Ventricle: The right ventricular size is normal. No increase in right ventricular wall thickness. Right ventricular systolic function is normal. There is normal pulmonary artery systolic pressure. The tricuspid  regurgitant velocity is 2.46 m/s, and  with an assumed right atrial pressure of 3 mmHg, the estimated right ventricular systolic pressure is [id number] mmHg. Left Atrium: Left atrial size was normal in size. Right Atrium: Right atrial size was normal in size. Pericardium: A small pericardial effusion is present. The pericardial effusion is anterior to the right ventricle. Mitral Valve: The mitral valve is grossly normal. Moderate mitral annular calcification. Mild mitral valve regurgitation. MV peak gradient, 3.6 mmHg. The mean mitral valve gradient is 1.0 mmHg. Tricuspid Valve: The tricuspid valve is grossly normal. Tricuspid valve regurgitation is moderate. Aortic Valve: The aortic valve is tricuspid. There is mild calcification of the aortic valve. There is mild aortic valve annular calcification. Aortic valve regurgitation is not visualized. Aortic valve sclerosis/calcification is present, without any evidence of aortic stenosis. Aortic valve mean gradient measures 2.0 mmHg. Aortic valve peak gradient measures 4.5 mmHg. Aortic valve area, by VTI measures 2.38 cm. Pulmonic Valve: The pulmonic valve was grossly normal. Pulmonic valve regurgitation is trivial. Aorta: The aortic root is normal in size and structure. IAS/Shunts: No atrial level shunt detected by color flow Doppler.  LEFT VENTRICLE PLAX 2D LVIDd:         3.80 cm   Diastology LVIDs:         2.30 cm   LV e' medial:    3.48 cm/s LV PW:         1.30 cm   LV E/e' medial:  14.4 LV IVS:        1.30 cm   LV e' lateral:   4.79 cm/s LVOT diam:     2.00 cm    LV E/e' lateral: 10.5 LV SV:         43 LV SV Index:   23 LVOT Area:     3.14 cm  RIGHT VENTRICLE             IVC RV Basal diam:  3.10 cm     IVC diam: 0.90 cm RV S prime:     19.10 cm/s TAPSE (M-mode): 1.9 cm LEFT ATRIUM             Index        RIGHT ATRIUM           Index LA diam:        2.90 cm 1.54 cm/m   RA Area:     10.30 cm LA Vol (A2C):   33.0 ml 17.55 ml/m  RA Volume:   20.60 ml  10.96 ml/m LA Vol (A4C):   33.5 ml 17.82 ml/m LA Biplane Vol: 34.3 ml 18.25 ml/m  AORTIC VALVE AV Area (Vmax):    2.59 cm AV Area (Vmean):   2.45 cm AV Area (VTI):     2.38 cm AV Vmax:           106.00 cm/s AV Vmean:          69.600 cm/s AV VTI:            0.181 m AV Peak Grad:      4.5 mmHg AV Mean Grad:      2.0 mmHg LVOT Vmax:         87.40 cm/s LVOT Vmean:        54.200 cm/s LVOT VTI:          0.137 m LVOT/AV VTI ratio: 0.76  AORTA Ao Root diam: 2.80 cm Ao Asc diam:  2.10 cm MITRAL VALVE  TRICUSPID VALVE MV Area (PHT): 2.97 cm    TR Peak grad:   24.2 mmHg MV Area VTI:   2.36 cm    TR Vmax:        246.00 cm/s MV Peak grad:  3.6 mmHg MV Mean grad:  1.0 mmHg    SHUNTS MV Vmax:       0.95 m/s    Systemic VTI:  0.14 m MV Vmean:      51.9 cm/s   Systemic Diam: 2.00 cm MV Decel Time: 255 msec MV E velocity: 50.20 cm/s MV A velocity: 78.80 cm/s MV E/A ratio:  0.64 Rozann Lesches MD Electronically signed by Rozann Lesches MD Signature Date/Time: 12/26/2020/4:20:16 PM    Final     Assessment & Plan    1.  Chest pain with elevated HS Troponin   Last troponin on 12/25/20 1,222.    There was initial concern for myopericarditis.  Echocardiogram revealed LVEF 30 to 35% range with regional wall motion abnormalities Small pericardial effusion.  I have reviewed the images  Images sugg of Takotsubo's CM vs ischemic CAD.   Pt does have coronary calcifications on CT scan (Neg myoview in 2020) Plan for cath when stabilized    Will give IV lfuid at 6 am tomorrow   Keep NPO after MN.  Reassess labs in am  2   Hypotension  BP low today  Getting fluids Cr is up some    Will cut toprol back to 12.5 daily for now  I would not recomm Midodrine which is vasoconstricts in the setting of new dx of cardiomyopathy and NSTEMI     2.  Hx of PAF   Remains in SR/ST    3.  COVID-19 positive.  Test on 12/23  CXR yesterday with NAD  She is currently on remdesivir.  4.  Prior history of DVT and pulmonary embolus,  At home on coumadin .  Had been therapeutic   Now on heparin .  5  HL   Pt on simvistatin 20   LDL in AUg 2022 was 74   Will switch to Lipitor 40 mg     Note Crestor made her sick to stomach    5.  CKD stage IIIb, creatinine 1.47  Was 1.23  Follow in am to see if improved   Will give fluids starting at 6 AM tomorrow      Signed, Dorris Carnes, MD  12/28/2020, 9:20 AM

## 2020-12-28 NOTE — Plan of Care (Signed)
°  Problem: Education: Goal: Knowledge of General Education information will improve Description: Including pain rating scale, medication(s)/side effects and non-pharmacologic comfort measures Outcome: Progressing   Problem: Health Behavior/Discharge Planning: Goal: Ability to manage health-related needs will improve Outcome: Progressing   Problem: Clinical Measurements: Goal: Ability to maintain clinical measurements within normal limits will improve Outcome: Progressing Goal: Will remain free from infection Outcome: Progressing Goal: Diagnostic test results will improve Outcome: Progressing Goal: Respiratory complications will improve Outcome: Progressing Goal: Cardiovascular complication will be avoided Outcome: Progressing   Problem: Activity: Goal: Risk for activity intolerance will decrease Outcome: Progressing   Problem: Nutrition: Goal: Adequate nutrition will be maintained Outcome: Progressing   Problem: Coping: Goal: Level of anxiety will decrease Outcome: Progressing   Problem: Elimination: Goal: Will not experience complications related to bowel motility Outcome: Progressing Goal: Will not experience complications related to urinary retention Outcome: Progressing   Problem: Pain Managment: Goal: General experience of comfort will improve Outcome: Progressing   Problem: Safety: Goal: Ability to remain free from injury will improve Outcome: Progressing   Problem: Skin Integrity: Goal: Risk for impaired skin integrity will decrease Outcome: Progressing   Problem: Education: Goal: Knowledge of risk factors and measures for prevention of condition will improve Outcome: Progressing   Problem: Coping: Goal: Psychosocial and spiritual needs will be supported Outcome: Progressing   Problem: Respiratory: Goal: Will maintain a patent airway Outcome: Progressing Goal: Complications related to the disease process, condition or treatment will be avoided or  minimized Outcome: Progressing   Problem: Education: Goal: Understanding of cardiac disease, CV risk reduction, and recovery process will improve Outcome: Progressing Goal: Individualized Educational Video(s) Outcome: Progressing   Problem: Activity: Goal: Ability to tolerate increased activity will improve Outcome: Progressing   Problem: Cardiac: Goal: Ability to achieve and maintain adequate cardiovascular perfusion will improve Outcome: Progressing

## 2020-12-28 NOTE — Progress Notes (Addendum)
Progress Note  Patient Name: Hannah [name] Date of Encounter: 12/29/2020  [facility] Cardiologist: Dietrich Pates, MD   Subjective   Developed Aflutter with RVR yesterday evening but converted back to NSR/ST this AM.  Continues to have intermittent chest tightness. Has mild cough. No SOB.   Inpatient Medications    Scheduled Meds:  vitamin C  500 mg Oral Daily   aspirin EC  81 mg Oral Daily   atorvastatin  40 mg Oral Daily   insulin aspart  0-9 Units Subcutaneous TID WC   levothyroxine  25 mcg Oral QAC breakfast   metoprolol succinate  12.5 mg Oral Daily   pantoprazole  40 mg Oral Daily   sodium chloride flush  3 mL Intravenous Q12H   zinc sulfate  220 mg Oral Daily   Continuous Infusions:  sodium chloride     sodium chloride 75 mL/hr at 12/29/20 0638   heparin 600 Units/hr (12/29/20 0314)   remdesivir 100 mg in NS 100 mL 200 mL/hr at 12/28/20 2210   PRN Meds: sodium chloride, acetaminophen, albuterol, guaiFENesin-dextromethorphan, ondansetron **OR** ondansetron (ZOFRAN) IV, sodium chloride flush   Vital Signs    Vitals:   12/29/20 0500 12/29/20 0600 12/29/20 0613 12/29/20 0700  BP: 94/74 91/60 91/60  95/68  Pulse: 100 91 91 91  Resp: 20 14 20 19   Temp: 98.1 F (36.7 C)  98 F (36.7 C) 98.1 F (36.7 C)  TempSrc: Oral  Oral Oral  SpO2:   96% 95%  Weight:      Height:        Intake/Output Summary (Last 24 hours) at 12/29/2020 0925 Last data filed at 12/29/2020 0755 Gross per 24 hour  Intake 387.11 ml  Output --  Net 387.11 ml   Last 3 Weights 12/25/2020 12/25/2020 08/19/2020  Weight (lbs) 168 lb 6.9 oz 175 lb 172 lb 6.4 oz  Weight (kg) 76.4 kg 79.379 kg 78.2 kg      Telemetry    Aflutter overnight; NSR/ST this AM - Personally Reviewed  ECG    No new tracing - Personally Reviewed  Physical Exam   GEN: No acute distress.   Neck: No JVD Cardiac: RRR, 2/6 systolic murmur  Respiratory: Clear to auscultation bilaterally. GI: Soft, nontender,  non-distended  MS: No edema; No deformity. Neuro:  Nonfocal  Psych: Normal affect   Labs    High Sensitivity Troponin:   Recent Labs  Lab 12/25/20 1241 12/25/20 1410 12/25/20 1700 12/25/20 1708  TROPONINIHS 8 77* 1,105* 1,222*     Chemistry Recent Labs  Lab 12/27/20 0017 12/27/20 0315 12/28/20 0145 12/28/20 2239 12/29/20 0048  NA 134*  --  133* 135 133*  K 3.9  --  4.4 4.2 4.3  CL 105  --  105 107 108  CO2 20*  --  18* 18* 16*  GLUCOSE 117*  --  92 107* 99  BUN 21  --  30* 38* 39*  CREATININE 1.31*  --  1.47* 1.46* 1.47*  CALCIUM 8.3*  --  8.0* 7.9* 7.9*  MG  --  1.8  --  2.0  --   PROT 5.8*  --  5.8*  --   --   ALBUMIN 2.7*  --  2.6*  --   --   AST 30  --  30  --   --   ALT 12  --  13  --   --   ALKPHOS 45  --  42  --   --  BILITOT 0.5  --  0.4  --   --   GFRNONAA 41*  --  36* 36* 36*  ANIONGAP 9  --  10 10 9     Lipids No results for input(s): CHOL, TRIG, HDL, LABVLDL, LDLCALC, CHOLHDL in the last 168 hours.  Hematology Recent Labs  Lab 12/27/20 0017 12/28/20 0145 12/29/20 0048  WBC 7.6 6.7 6.5  RBC 5.22* 5.53* 5.11  HGB 13.0 13.6 12.2  HCT 40.9 43.2 40.2  MCV 78.4* 78.1* 78.7*  MCH 24.9* 24.6* 23.9*  MCHC 31.8 31.5 30.3  RDW 20.2* 20.5* 20.4*  PLT 204 231 216   Thyroid No results for input(s): TSH, FREET4 in the last 168 hours.  BNP Recent Labs  Lab 12/25/20 1241  BNP 137.4*    DDimer  Recent Labs  Lab 12/25/20 1708  DDIMER 0.45     Radiology    No results found.  Cardiac Studies   TTE 12/26/20: IMPRESSIONS     1. Left ventricular ejection fraction, by estimation, is 30 to 35%. The  left ventricle has moderately decreased function. The left ventricle  demonstrates regional wall motion abnormalities (see scoring  diagram/findings for description). There is mild  left ventricular hypertrophy. Left ventricular diastolic parameters are  consistent with Grade I diastolic dysfunction (impaired relaxation).  Elevated left  ventricular end-diastolic pressure.   2. Right ventricular systolic function is normal. The right ventricular  size is normal. There is normal pulmonary artery systolic pressure. The  estimated right ventricular systolic pressure is [id number] mmHg.   3. A small pericardial effusion is present. The pericardial effusion is  anterior to the right ventricle.   4. The mitral valve is grossly normal. Mild mitral valve regurgitation.  Moderate mitral annular calcification.   5. Tricuspid valve regurgitation is moderate.   6. The aortic valve is tricuspid. There is mild calcification of the  aortic valve. Aortic valve regurgitation is not visualized. Aortic valve  sclerosis/calcification is present, without any evidence of aortic  stenosis. Aortic valve mean gradient  measures 2.0 mmHg.   Comparison(s): Prior images reviewed side by side. LV dysfunction and wall  motion abnormalities are new in comparison.    LV Wall Scoring:  The mid and distal anterior septum, entire apex, and mid inferoseptal  segment  are akinetic. The mid anterolateral segment, mid anterior segment, and mid  inferior segment are hypokinetic. The posterior wall, basal anteroseptal  segment, basal anterolateral segment, basal anterior segment, basal  inferior  segment, and basal inferoseptal segment are normal.    Patient Profile     80 y.o. female with history of PEA arrest due to large PE in 2014 on chronic warfarin, HTN, HLD, DMII, large bowel stricture s/p colectomy with ostomy in place, and CKD IIIB who presented to the hospital with chest pain and elevated troponin for which Cardiology was consulted. Also noted to be COVID positive on admission. TTE with EF 30-35% with multiple WMA now awaiting LHC.  Assessment & Plan    #Chest Pain with Elevated Troponin: Patient presented with chest pain with trop 1222.  TTE with LVEF 30-35% with regional WMA as detailed above and small pericardial effusion. Differential at this  time remains broad and includes myopericarditis in the setting of COVID-19, ischemic etiology, vs stress cardiomyopathy. Will plan for ischemic evaluation over the next couple of days and if no obstructive disease, will need CMR. -Plan for [facility] tomorrow -If LHC without obstructive disease, will need CMR to assess for myopericarditis -Continue heparin  gtt for [facility] -Continue ASA 81mg  daily -Contiue lipitor 40mg  daily -Continue metop 12.5mg  daily  #Newly Diagnosed HFrEF: TTE with LVEF 30-35% with multiple WMA as detailed above. Concern for ischemic vs myopericarditis vs stress CM as detailed above. LHC pending while awaiting recovery from COVID-19. Clinically euvolemic on examination. BP soft limiting GDMT. -Plan for LHC/RHC as above; if no obstructive disease, will need CMR -Continue metop 12.5mg  daily; unable to up-titrate due to soft blood pressures -Will add entresto vs ARB/spiro pending Bps and renal function -Will start farxiga prior to discharge -Monitor I/Os and daily weights -Low Na diet  #Paroxysmal Afib/flutter: With episode of RVR on 12/26 now converted back to NSR/ST. On warfarin at home but heparin while awaiting possible procedures. -Continue heparin gtt -Continue metop 12.5mg  daily -If recurrent RVR, will need to use amio for rate control due to soft BPs  #COVID 19 Positive: Found on admission with mild symptoms -On remdesivir  #Prior DVT/History of PE: Occurred in 2014 c/b PEA arrest. On lifelong warfarin at home, which has been transitioned to heparin as above. -Continue heparin gtt  #HLD: -Continue atorvastatin 40mg  daily  #CKD IIIB: -Renally dose medications -Judicious use of contrast   INFORMED CONSENT: I have reviewed the risks, indications, and alternatives to cardiac catheterization, possible angioplasty, and stenting with the patient. Risks include but are not limited to bleeding, infection, vascular injury, stroke, myocardial infection, arrhythmia,  kidney injury, radiation-related injury in the case of prolonged fluoroscopy use, emergency cardiac surgery, and death. The patient understands the risks of serious complication is 1-2 in [id number] with diagnostic cardiac cath and 1-2% or less with angioplasty/stenting.    For questions or updates, please contact [place] Please consult [email or web address] for contact info under        Signed, Freada Bergeron, MD  12/29/2020, 9:25 AM

## 2020-12-28 NOTE — Progress Notes (Signed)
PROGRESS NOTE        PATIENT DETAILS Name: Hannah [name] Age: 80 y.o. Sex: female Date of Birth: [birth year] Admit Date: 12/25/2020 Admitting Physician Evalee Mutton Kristeen Mans, MD [id number], Steele Berg, MD  Brief Narrative: Patient is a 80 y.o. female with history of PEA arrest due to a large PE in 2014-on anticoagulation with Coumadin, DM-2, HTN, HLD, large bowel stricture-s/p colectomy with ostomy in place-presented to the hospital with chest pain.  See below for further details.  Subjective: No further chest pain-lying comfortably in bed.  Objective: Vitals: Blood pressure (!) 102/59, pulse 86, temperature 98 F (36.7 C), temperature source Oral, resp. rate 17, height 5\' 7"  (1.702 m), weight 76.4 kg, SpO2 97 %.   Exam: Gen Exam:Alert awake-not in any distress HEENT:atraumatic, normocephalic Chest: B/L clear to auscultation anteriorly CVS:S1S2 regular Abdomen:soft non tender, non distended Extremities:no edema Neurology: Non focal Skin: no rash   Pertinent Labs/Radiology: Recent Labs  Lab 12/27/20 0017 12/28/20 0145  WBC 7.6 6.7  HGB 13.0 13.6  PLT 204 231  NA 134* 133*  K 3.9 4.4  CREATININE 1.31* 1.47*  AST 30 30  ALT 12 13  ALKPHOS 45 42  BILITOT 0.5 0.4    Assessment/Plan: Non-STEMI versus myocarditis: Presented with chest pain/troponin elevation-echo with suppressed EF and numerous wall motion abnormalities.  Given COVID-19 infection-myocarditis also in the differential.  On IV heparin/aspirin/beta-blocker/statin-cardiology following with plans for [facility] over the next few days.    COVID-19 infection: No respiratory symptoms-see concern for myocarditis-on Remdesivir.  CXR negative for PNA.  CT value 23 on COVID PCR.    Recent Labs    12/25/20 1708 12/26/20 0741 12/27/20 0017  DDIMER 0.45  --   --   FERRITIN 16  --   --   LDH 178  --   --   CRP 3.0* 9.4* 10.0*    Newly diagnosed systolic heart failure: BP soft today-but volume  status stable.  Continue low-dose metoprolol-hold lisinopril until BP better.  PAF: Maintaining sinus rhythm-if goes back into RVR-plan is to start amiodarone.  On beta-blocker and full dose anticoagulation.  History of PEA arrest due to massive pulmonary embolism: Was on Coumadin-now on hold-on IV heparin   CKD stage IIIa: Slight bump in creatinine today-hold lisinopril-as plans for [facility].  BP also soft.  HTN: BP soft this morning-continue metoprolol cautiously-hold lisinopril.  HLD: Continue statin.  Hypothyroidism: Continue Synthroid  DM-2 (A1c 6.09 August 2020): CBG stable-continue SSI  Recent Labs    12/27/20 1731 12/27/20 2111 12/28/20 0601  GLUCAP 100* 98 101*     Chronic debility/deconditioning: At baseline-walks with the help of a walker-not very ambulatory.  PT/OT eval will be ordered.  BMI Estimated body mass index is 26.38 kg/m as calculated from the following:   Height as of this encounter: 5\' 7"  (1.702 m).   Weight as of this encounter: 76.4 kg.    Procedures: None Consults: Cardiology DVT Prophylaxis: Coumadin/heparin Code Status:Full code  Family Communication: Daughter-[phone]-updated over the phone on 12/25  Time spent: 35 minutes-Greater than 50% of this time was spent in counseling, explanation of diagnosis, planning of further management, and coordination of care.   Disposition Plan: Status is: Inpatient.  The patient will require care spanning > 2 midnights and should be moved to inpatient because: Chest pain-possible non-STEMI-COVID-febrile this morning-checking cultures-starting IV Remdesivir-not yet stable  for discharge.   Diet: Diet Order             Diet heart healthy/carb modified Room service appropriate? Yes; Fluid consistency: Thin  Diet effective now                     Antimicrobial agents: Anti-infectives (From admission, onward)    Start     Dose/Rate Route Frequency Ordered Stop   12/27/20 1000   remdesivir 100 mg in sodium chloride 0.9 % 100 mL IVPB       See Hyperspace for full Linked Orders Report.   100 mg 200 mL/hr over 30 Minutes Intravenous Daily 12/26/20 1500 12/31/20 0959   12/26/20 1600  remdesivir 200 mg in sodium chloride 0.9% 250 mL IVPB       See Hyperspace for full Linked Orders Report.   200 mg 580 mL/hr over 30 Minutes Intravenous Once 12/26/20 1500 12/27/20 2355        MEDICATIONS: Scheduled Meds:  vitamin C  500 mg Oral Daily   aspirin EC  81 mg Oral Daily   insulin aspart  0-9 Units Subcutaneous TID WC   levothyroxine  25 mcg Oral QAC breakfast   lisinopril  5 mg Oral Daily   metoprolol succinate  50 mg Oral Daily   pantoprazole  40 mg Oral Daily   simvastatin  20 mg Oral QHS   sodium chloride flush  3 mL Intravenous Q12H   zinc sulfate  220 mg Oral Daily   Continuous Infusions:  sodium chloride     heparin 900 Units/hr (12/28/20 0415)   remdesivir 100 mg in NS 100 mL 100 mg (12/27/20 0832)   PRN Meds:.sodium chloride, acetaminophen, albuterol, guaiFENesin-dextromethorphan, ondansetron **OR** ondansetron (ZOFRAN) IV, sodium chloride flush   I have personally reviewed following labs and imaging studies  LABORATORY DATA: CBC: Recent Labs  Lab 12/25/20 1241 12/26/20 0741 12/27/20 0017 12/28/20 0145  WBC 11.0* 9.3 7.6 6.7  NEUTROABS 8.6*  --   --   --   HGB 13.6 12.9 13.0 13.6  HCT 44.6 42.4 40.9 43.2  MCV 81.4 78.5* 78.4* 78.1*  PLT 294 229 204 231     Basic Metabolic Panel: Recent Labs  Lab 12/25/20 1241 12/26/20 0741 12/27/20 0017 12/27/20 0315 12/28/20 0145  NA 135 134* 134*  --  133*  K 5.2* 4.2 3.9  --  4.4  CL 103 102 105  --  105  CO2 23 23 20*  --  18*  GLUCOSE 119* 102* 117*  --  92  BUN 23 16 21   --  30*  CREATININE 1.32* 1.23* 1.31*  --  1.47*  CALCIUM 9.0 8.9 8.3*  --  8.0*  MG  --   --   --  1.8  --      GFR: Estimated Creatinine Clearance: 33.1 mL/min (A) (by C-G formula based on SCr of 1.47 mg/dL  (H)).  Liver Function Tests: Recent Labs  Lab 12/27/20 0017 12/28/20 0145  AST 30 30  ALT 12 13  ALKPHOS 45 42  BILITOT 0.5 0.4  PROT 5.8* 5.8*  ALBUMIN 2.7* 2.6*    No results for input(s): LIPASE, AMYLASE in the last 168 hours. No results for input(s): AMMONIA in the last 168 hours.  Coagulation Profile: Recent Labs  Lab 12/25/20 1410 12/26/20 0114 12/27/20 0017 12/28/20 0145  INR 2.7* 2.8* 2.3* 1.8*     Cardiac Enzymes: No results for input(s): CKTOTAL, CKMB, CKMBINDEX, TROPONINI in the  last 168 hours.  BNP (last 3 results) No results for input(s): PROBNP in the last 8760 hours.  Lipid Profile: No results for input(s): CHOL, HDL, LDLCALC, TRIG, CHOLHDL, LDLDIRECT in the last 72 hours.  Thyroid Function Tests: No results for input(s): TSH, T4TOTAL, FREET4, T3FREE, THYROIDAB in the last 72 hours.  Anemia Panel: Recent Labs    12/25/20 1708  FERRITIN 16     Urine analysis:    Component Value Date/Time   COLORURINE YELLOW 12/26/2020 1104   APPEARANCEUR CLEAR 12/26/2020 1104   LABSPEC 1.010 12/26/2020 1104   PHURINE 8.5 (H) 12/26/2020 1104   GLUCOSEU NEGATIVE 12/26/2020 1104   HGBUR SMALL (A) 12/26/2020 1104   BILIRUBINUR NEGATIVE 12/26/2020 1104   KETONESUR NEGATIVE 12/26/2020 1104   PROTEINUR NEGATIVE 12/26/2020 1104   UROBILINOGEN 0.2 03/14/2017 1102   NITRITE POSITIVE (A) 12/26/2020 1104   LEUKOCYTESUR NEGATIVE 12/26/2020 1104    Sepsis Labs: Lactic Acid, Venous    Component Value Date/Time   LATICACIDVEN 0.86 12/15/2017 2122    MICROBIOLOGY: Recent Results (from the past 240 hour(s))  Resp Panel by RT-PCR (Flu A&B, Covid) Nasopharyngeal Swab     Status: Abnormal   Collection Time: 12/25/20 12:23 PM   Specimen: Nasopharyngeal Swab; Nasopharyngeal(NP) swabs in vial transport medium  Result Value Ref Range Status   SARS Coronavirus 2 by RT PCR POSITIVE (A) NEGATIVE Final    Comment: (NOTE) SARS-CoV-2 target nucleic acids are  DETECTED.  The SARS-CoV-2 RNA is generally detectable in upper respiratory specimens during the acute phase of infection. Positive results are indicative of the presence of the identified virus, but do not rule out bacterial infection or co-infection with other pathogens not detected by the test. Clinical correlation with patient history and other diagnostic information is necessary to determine patient infection status. The expected result is Negative.  Fact Sheet for Patients: EntrepreneurPulse.com.au  Fact Sheet for Healthcare Providers: IncredibleEmployment.be  This test is not yet approved or cleared by the [place] FDA and  has been authorized for detection and/or diagnosis of SARS-CoV-2 by FDA under an Emergency Use Authorization (EUA).  This EUA will remain in effect (meaning this test can be used) for the duration of  the COVID-19 declaration under Section 564(b)(1) of the A ct, 21 U.S.C. section 360bbb-3(b)(1), unless the authorization is terminated or revoked sooner.     Influenza A by PCR NEGATIVE NEGATIVE Final   Influenza B by PCR NEGATIVE NEGATIVE Final    Comment: (NOTE) The Xpert Xpress SARS-CoV-2/FLU/RSV plus assay is intended as an aid in the diagnosis of influenza from Nasopharyngeal swab specimens and should not be used as a sole basis for treatment. Nasal washings and aspirates are unacceptable for Xpert Xpress SARS-CoV-2/FLU/RSV testing.  Fact Sheet for Patients: EntrepreneurPulse.com.au  Fact Sheet for Healthcare Providers: IncredibleEmployment.be  This test is not yet approved or cleared by the [place] FDA and has been authorized for detection and/or diagnosis of SARS-CoV-2 by FDA under an Emergency Use Authorization (EUA). This EUA will remain in effect (meaning this test can be used) for the duration of the COVID-19 declaration under Section 564(b)(1) of the Act, 21  U.S.C. section 360bbb-3(b)(1), unless the authorization is terminated or revoked.  Performed at [facility], [place] [address] [place], [place] [address]   MRSA Next Gen by PCR, Nasal     Status: None   Collection Time: 12/26/20 12:20 AM   Specimen: Nasal Mucosa; Nasal Swab  Result Value Ref Range Status  MRSA by PCR Next Gen NOT DETECTED NOT DETECTED Final    Comment: (NOTE) The GeneXpert MRSA Assay (FDA approved for NASAL specimens only), is one component of a comprehensive MRSA colonization surveillance program. It is not intended to diagnose MRSA infection nor to guide or monitor treatment for MRSA infections. Test performance is not FDA approved in patients less than 46 years old. Performed at [facility], [place] [address] [place], [place] [address]   Culture, blood (routine x 2)     Status: None (Preliminary result)   Collection Time: 12/26/20  2:29 PM   Specimen: BLOOD LEFT HAND  Result Value Ref Range Status   Specimen Description BLOOD LEFT HAND  Final   Special Requests   Final    BOTTLES DRAWN AEROBIC AND ANAEROBIC Blood Culture results may not be optimal due to an inadequate volume of blood received in culture bottles   Culture   Final    NO GROWTH 2 DAYS Performed at [facility], [place] [address], [place], [place] [address]    Report Status PENDING  Incomplete  Culture, blood (routine x 2)     Status: None (Preliminary result)   Collection Time: 12/26/20  2:40 PM   Specimen: BLOOD LEFT WRIST  Result Value Ref Range Status   Specimen Description BLOOD LEFT WRIST  Final   Special Requests   Final    BOTTLES DRAWN AEROBIC ONLY Blood Culture results may not be optimal due to an inadequate volume of blood received in culture bottles   Culture   Final    NO GROWTH 2 DAYS Performed at [facility], [place] [address], [place], [place] [address]    Report Status PENDING  Incomplete    RADIOLOGY STUDIES/RESULTS: DG Chest Port 1 View  Result  Date: 12/27/2020 CLINICAL DATA:  Shortness of breath and nausea, COVID positive. EXAM: PORTABLE CHEST 1 VIEW COMPARISON:  12/25/2020. FINDINGS: The heart size and mediastinal contours are within normal limits. Atherosclerotic calcification of the aorta is noted. No consolidation, effusion, or pneumothorax. No acute osseous abnormality. IMPRESSION: No acute cardiopulmonary process. Electronically Signed   By: Brett Fairy M.D.   On: 12/27/2020 04:35   ECHOCARDIOGRAM COMPLETE  Result Date: 12/26/2020    ECHOCARDIOGRAM REPORT   Patient Name:   Hannah [name] Date of Exam: 12/26/2020 Medical Rec #:  [id number]    Height:       67.0 in Accession #:    [id number]   Weight:       168.4 lb Date of Birth:  [birth year]    BSA:          1.880 m Patient Age:    31 years     BP:           110/56 mmHg Patient Gender: F            HR:           82 bpm. Exam Location:  Inpatient Procedure: 2D Echo, Cardiac Doppler and Color Doppler Indications:    Chest pain  History:        Patient has no prior history of Echocardiogram examinations,                 most recent 04/23/2020. Risk Factors:Hypertension, Diabetes and                 Dyslipidemia. COVID-19. Hx pulmonary embolus.  Sonographer:    Clayton Lefort RDCS (AE) Referring Phys: [id number] Orma Flaming  IMPRESSIONS  1. Left ventricular ejection fraction, by estimation, is 30 to 35%. The left ventricle has moderately decreased function. The left ventricle demonstrates regional wall motion abnormalities (see scoring diagram/findings for description). There is mild left ventricular hypertrophy. Left ventricular diastolic parameters are consistent with Grade I diastolic dysfunction (impaired relaxation). Elevated left ventricular end-diastolic pressure.  2. Right ventricular systolic function is normal. The right ventricular size is normal. There is normal pulmonary artery systolic pressure. The estimated right ventricular systolic pressure is [id number] mmHg.  3. A small pericardial effusion  is present. The pericardial effusion is anterior to the right ventricle.  4. The mitral valve is grossly normal. Mild mitral valve regurgitation. Moderate mitral annular calcification.  5. Tricuspid valve regurgitation is moderate.  6. The aortic valve is tricuspid. There is mild calcification of the aortic valve. Aortic valve regurgitation is not visualized. Aortic valve sclerosis/calcification is present, without any evidence of aortic stenosis. Aortic valve mean gradient measures 2.0 mmHg. Comparison(s): Prior images reviewed side by side. LV dysfunction and wall motion abnormalities are new in comparison. FINDINGS  Left Ventricle: Left ventricular ejection fraction, by estimation, is 30 to 35%. The left ventricle has moderately decreased function. The left ventricle demonstrates regional wall motion abnormalities. The left ventricular internal cavity size was normal in size. There is mild left ventricular hypertrophy. Left ventricular diastolic parameters are consistent with Grade I diastolic dysfunction (impaired relaxation). Elevated left ventricular end-diastolic pressure.  LV Wall Scoring: The mid and distal anterior septum, entire apex, and mid inferoseptal segment are akinetic. The mid anterolateral segment, mid anterior segment, and mid inferior segment are hypokinetic. The posterior wall, basal anteroseptal segment, basal anterolateral segment, basal anterior segment, basal inferior segment, and basal inferoseptal segment are normal. Right Ventricle: The right ventricular size is normal. No increase in right ventricular wall thickness. Right ventricular systolic function is normal. There is normal pulmonary artery systolic pressure. The tricuspid regurgitant velocity is 2.46 m/s, and  with an assumed right atrial pressure of 3 mmHg, the estimated right ventricular systolic pressure is [id number] mmHg. Left Atrium: Left atrial size was normal in size. Right Atrium: Right atrial size was normal in size.  Pericardium: A small pericardial effusion is present. The pericardial effusion is anterior to the right ventricle. Mitral Valve: The mitral valve is grossly normal. Moderate mitral annular calcification. Mild mitral valve regurgitation. MV peak gradient, 3.6 mmHg. The mean mitral valve gradient is 1.0 mmHg. Tricuspid Valve: The tricuspid valve is grossly normal. Tricuspid valve regurgitation is moderate. Aortic Valve: The aortic valve is tricuspid. There is mild calcification of the aortic valve. There is mild aortic valve annular calcification. Aortic valve regurgitation is not visualized. Aortic valve sclerosis/calcification is present, without any evidence of aortic stenosis. Aortic valve mean gradient measures 2.0 mmHg. Aortic valve peak gradient measures 4.5 mmHg. Aortic valve area, by VTI measures 2.38 cm. Pulmonic Valve: The pulmonic valve was grossly normal. Pulmonic valve regurgitation is trivial. Aorta: The aortic root is normal in size and structure. IAS/Shunts: No atrial level shunt detected by color flow Doppler.  LEFT VENTRICLE PLAX 2D LVIDd:         3.80 cm   Diastology LVIDs:         2.30 cm   LV e' medial:    3.48 cm/s LV PW:         1.30 cm   LV E/e' medial:  14.4 LV IVS:        1.30 cm   LV e' lateral:  4.79 cm/s LVOT diam:     2.00 cm   LV E/e' lateral: 10.5 LV SV:         43 LV SV Index:   23 LVOT Area:     3.14 cm  RIGHT VENTRICLE             IVC RV Basal diam:  3.10 cm     IVC diam: 0.90 cm RV S prime:     19.10 cm/s TAPSE (M-mode): 1.9 cm LEFT ATRIUM             Index        RIGHT ATRIUM           Index LA diam:        2.90 cm 1.54 cm/m   RA Area:     10.30 cm LA Vol (A2C):   33.0 ml 17.55 ml/m  RA Volume:   20.60 ml  10.96 ml/m LA Vol (A4C):   33.5 ml 17.82 ml/m LA Biplane Vol: 34.3 ml 18.25 ml/m  AORTIC VALVE AV Area (Vmax):    2.59 cm AV Area (Vmean):   2.45 cm AV Area (VTI):     2.38 cm AV Vmax:           106.00 cm/s AV Vmean:          69.600 cm/s AV VTI:            0.181 m AV  Peak Grad:      4.5 mmHg AV Mean Grad:      2.0 mmHg LVOT Vmax:         87.40 cm/s LVOT Vmean:        54.200 cm/s LVOT VTI:          0.137 m LVOT/AV VTI ratio: 0.76  AORTA Ao Root diam: 2.80 cm Ao Asc diam:  2.10 cm MITRAL VALVE               TRICUSPID VALVE MV Area (PHT): 2.97 cm    TR Peak grad:   24.2 mmHg MV Area VTI:   2.36 cm    TR Vmax:        246.00 cm/s MV Peak grad:  3.6 mmHg MV Mean grad:  1.0 mmHg    SHUNTS MV Vmax:       0.95 m/s    Systemic VTI:  0.14 m MV Vmean:      51.9 cm/s   Systemic Diam: 2.00 cm MV Decel Time: 255 msec MV E velocity: 50.20 cm/s MV A velocity: 78.80 cm/s MV E/A ratio:  0.64 Nona Dell MD Electronically signed by Nona Dell MD Signature Date/Time: 12/26/2020/4:20:16 PM    Final      LOS: 1 day   Jeoffrey Massed, MD  [facility]    To contact the attending provider between 7A-7P or the covering provider during after hours 7P-7A, please log into the web site [email or web address] and access using universal [place] password for that web site. If you do not have the password, please call the hospital operator.  12/28/2020, 11:00 AM

## 2020-12-28 NOTE — Progress Notes (Signed)
ANTICOAGULATION CONSULT NOTE - Follow up [facility] for warfarin > heparin  Indication: pulmonary embolus  Allergies  Allergen Reactions   Augmentin [Amoxicillin-Pot Clavulanate] Itching and Other (See Comments)    Severe vaginal itching   Tranxene [Clorazepate] Itching   Crestor [Rosuvastatin]     Made her sick on stomach, she is able to tolerate the zocor   Penicillins Itching and Rash    Has patient had a PCN reaction causing immediate rash, facial/tongue/throat swelling, SOB or lightheadedness with hypotension: Yes Has patient had a PCN reaction causing severe rash involving mucus membranes or skin necrosis: No Has patient had a PCN reaction that required hospitalization: No Has patient had a PCN reaction occurring within the last 10 years: No If all of the above answers are "NO", then may proceed with Cephalosporin use.     Patient Measurements: Height: _0  (170.2 cm) Weight: 76.4 kg (168 lb 6.9 oz) IBW/kg (Calculated) : 61.6  Vital Signs: Temp: 98 F (36.7 C) (12/26 0707) Temp Source: Oral (12/26 0707) BP: 81/67 (12/26 1136) Pulse Rate: 86 (12/26 0707)  Labs: Recent Labs    12/25/20 1410 12/25/20 1700 12/25/20 1708 12/26/20 0114 12/26/20 0741 12/26/20 0741 12/27/20 0017 12/28/20 0145 12/28/20 1201  HGB  --   --   --   --  12.9   < > 13.0 13.6  --   HCT  --   --   --   --  42.4  --  40.9 43.2  --   PLT  --   --   --   --  229  --  204 231  --   LABPROT 28.5*  --   --  29.6*  --   --  25.0* 21.1*  --   INR 2.7*  --   --  2.8*  --   --  2.3* 1.8*  --   HEPARINUNFRC  --   --   --   --   --   --   --   --  0.74*  CREATININE  --   --   --   --  1.23*  --  1.31* 1.47*  --   TROPONINIHS 77* 1,105* 1,222*  --   --   --   --   --   --    < > = values in this interval not displayed.     Estimated Creatinine Clearance: 33.1 mL/min (A) (by C-G formula based on SCr of 1.47 mg/dL (H)).   Medical History: Past Medical History:  Diagnosis Date    Acute massive pulmonary embolism ([place]) 07/13/2012   Massive PE w/ PEA arrest 07/13/12 >TNK >IVC filter >discharged on comadin     Anticoagulated on warfarin    Chronic diastolic CHF (congestive heart failure) ([place]) 03/15/2017   Chronic kidney disease, stage 3a ([place]) 12/15/2018   Diabetes mellitus, type 2 ([place]) 07/15/2012   with peripheral neuropathy   DM2 (diabetes mellitus, type 2) ([place]) 07/23/2012   Hyperlipemia 07/14/2012   Hyperlipidemia 02/25/2013   Hypertension 07/14/2012   Hypothyroid 06/06/2013   Large bowel stricture ([place])    s/p colectomy in 2016   Neuropathy 02/12/2015   Osteoarthritis    s/p hip and knee replacements   Pulmonary hypertension ([place]) 07/26/2016   Syncope 10/2018   Thyroid disease     Medications:  Medications Prior to Admission  Medication Sig Dispense Refill Last Dose   acetaminophen (TYLENOL) 500 MG tablet Take 500 mg by mouth every  6 (six) hours as needed for mild pain.    unk   diclofenac Sodium (VOLTAREN) 1 % GEL Apply 4 g topically 4 (four) times daily. (Patient taking differently: Apply 4 g topically daily as needed.)   unk   furosemide (LASIX) 20 MG tablet Take 1 tablet (20 mg total) by mouth daily as needed for fluid or edema. 30 tablet 3    levothyroxine (EUTHYROX) 25 MCG tablet Take 1 tablet (25 mcg total) by mouth daily before breakfast. 90 tablet 1 12/25/2020   lisinopril (ZESTRIL) 5 MG tablet Take 1 tablet (5 mg total) by mouth daily. 90 tablet 1 12/25/2020   metFORMIN (GLUCOPHAGE-XR) 500 MG 24 hr tablet TAKE 1 TABLET BY MOUTH ONCE DAILY WITH BREAKFAST. PATIENT NEEDS AN APPT. 90 tablet 1 12/25/2020   metoprolol tartrate (LOPRESSOR) 25 MG tablet Take 1 tablet (25 mg total) by mouth 2 (two) times daily. 180 tablet 1 12/25/2020 at 0700   pantoprazole (PROTONIX) 40 MG tablet Take 1 tablet (40 mg total) by mouth daily. 30 tablet 1 12/25/2020   simvastatin (ZOCOR) 20 MG tablet Take 1 tablet (20 mg total) by mouth at bedtime. 90 tablet 1 12/24/2020   vitamin B-12  (CYANOCOBALAMIN) 1000 MCG tablet Take 1 tablet (1,000 mcg total) by mouth daily.   12/24/2020   vitamin C (ASCORBIC ACID) 500 MG tablet Take 500 mg by mouth daily.    12/24/2020   warfarin (COUMADIN) 5 MG tablet TAKE 1/2 TO 1 (ONE-HALF TO ONE) TABLET BY MOUTH DAILY AS DIRECTED BY  COUMADIN  CLINIC (Patient taking differently: Take 2.5-5 mg by mouth See admin instructions. Taking 1/2 tablet (2.5 mg) Tues, thur, Sat & Sunday, and taking 1 tablet ( 7m) on Monday, Wed, & Friday.) 80 tablet 1 12/24/2020 at 1700   Blood Glucose Monitoring Suppl (ONETOUCH VERIO) w/Device KIT 1 each by Does not apply route as directed. 1 kit 0    glucose blood (ONETOUCH VERIO) test strip USE 1 STRIP TO CHECK GLUCOSE ONCE DAILY AS DIRECTED 100 each 3    OneTouch Delica Lancets [id number] USE 1  TO CHECK GLUCOSE ONCE DAILY 100 each 3     Assessment: [id number] with h/o of PE/DVT on warfarin at home. INR on admission was therapeutic at 2.7. New low EF 35%.  Of note, in 2014, patient was admitted with massive PE >> cardiac/respiratory arrest with INR 2.17 (reason for higher INR goal?) Pharmacy consult to transition from warfarin to heparin in anticipation of Cath 12/27. H/H and Plt wnl, INR down to 1.8.  Heparin level today is slightly supratherapeutic at 0.74 on 900 units/ hour. No bleeding or infusion issues noted.   Home warfarin: 5 mg MWF; 2.5 mg on all other days   Goal of Therapy:  INR 2.5-3.5   per outpatient records  Heparin level 0.3-0.7 Monitor platelets by anticoagulation protocol: Yes   Plan:  Holding warfarin Decrease heparin infusion to 850 units/hr Check heparin level in 8 hours and daily while on heparin Continue to monitor H&H and platelets F/u plan after cath   Thank you for allowing [facility] participate in this patients care. TJens Som PharmD 12/28/2020 1:07 PM  **Pharmacist phone directory can be found on [email or web address] listed under [facility]*

## 2020-12-29 DIAGNOSIS — I130 Hypertensive heart and chronic kidney disease with heart failure and stage 1 through stage 4 chronic kidney disease, or unspecified chronic kidney disease: Secondary | ICD-10-CM

## 2020-12-29 DIAGNOSIS — I5032 Chronic diastolic (congestive) heart failure: Secondary | ICD-10-CM | POA: Diagnosis not present

## 2020-12-29 DIAGNOSIS — E7849 Other hyperlipidemia: Secondary | ICD-10-CM

## 2020-12-29 DIAGNOSIS — I10 Essential (primary) hypertension: Secondary | ICD-10-CM | POA: Diagnosis not present

## 2020-12-29 DIAGNOSIS — R072 Precordial pain: Secondary | ICD-10-CM | POA: Diagnosis not present

## 2020-12-29 LAB — BASIC METABOLIC PANEL
Anion gap: 9 (ref 5–15)
BUN: 39 mg/dL — ABNORMAL HIGH (ref 8–23)
CO2: 16 mmol/L — ABNORMAL LOW (ref 22–32)
Calcium: 7.9 mg/dL — ABNORMAL LOW (ref 8.9–10.3)
Chloride: 108 mmol/L (ref 98–111)
Creatinine, Ser: 1.47 mg/dL — ABNORMAL HIGH (ref 0.44–1.00)
GFR, Estimated: 36 mL/min — ABNORMAL LOW (ref >60)
Glucose, Bld: 99 mg/dL (ref 70–99)
Potassium: 4.3 mmol/L (ref 3.5–5.1)
Sodium: 133 mmol/L — ABNORMAL LOW (ref 135–145)

## 2020-12-29 LAB — GLUCOSE, CAPILLARY
Glucose-Capillary: 103 mg/dL — ABNORMAL HIGH (ref 70–99)
Glucose-Capillary: 89 mg/dL (ref 70–99)
Glucose-Capillary: 139 mg/dL — ABNORMAL HIGH (ref 70–99)
Glucose-Capillary: 80 mg/dL (ref 70–99)

## 2020-12-29 LAB — HEPARIN LEVEL (UNFRACTIONATED)
Heparin Unfractionated: 1.05 IU/mL — ABNORMAL HIGH (ref 0.30–0.70)
Heparin Unfractionated: 0.71 IU/mL — ABNORMAL HIGH (ref 0.30–0.70)

## 2020-12-29 LAB — CBC
HCT: 40.2 % (ref 36.0–46.0)
Hemoglobin: 12.2 g/dL (ref 12.0–15.0)
MCH: 23.9 pg — ABNORMAL LOW (ref 26.0–34.0)
MCHC: 30.3 g/dL (ref 30.0–36.0)
MCV: 78.7 fL — ABNORMAL LOW (ref 80.0–100.0)
Platelets: 216 10*3/uL (ref 150–400)
RBC: 5.11 MIL/uL (ref 3.87–5.11)
RDW: 20.4 % — ABNORMAL HIGH (ref 11.5–15.5)
WBC: 6.5 10*3/uL (ref 4.0–10.5)
nRBC: 0 % (ref 0.0–0.2)

## 2020-12-29 LAB — PROTIME-INR
INR: 1.9 — ABNORMAL HIGH (ref 0.8–1.2)
Prothrombin Time: 22.2 seconds — ABNORMAL HIGH (ref 11.4–15.2)

## 2020-12-29 MED ORDER — SODIUM CHLORIDE 0.9 % IV SOLN
INTRAVENOUS | Status: DC
  Administered 2020-12-30: 04:00:00 30 mL/h via INTRAVENOUS

## 2020-12-29 MED ORDER — SODIUM CHLORIDE 0.9% FLUSH: 3.0000 mL | INTRAVENOUS | Status: DC | PRN

## 2020-12-29 MED ORDER — SODIUM CHLORIDE 0.9 % IV SOLN: 250.0000 mL | INTRAVENOUS | Status: DC | PRN

## 2020-12-29 MED ORDER — SODIUM CHLORIDE 0.9% FLUSH
3.0000 mL | Freq: Two times a day (BID) | INTRAVENOUS | Status: DC
  Administered 2020-12-29 (×2): 3 mL via INTRAVENOUS

## 2020-12-29 MED ORDER — HEPARIN (PORCINE) 25000 UT/250ML-% IV SOLN: 350.0000 [IU]/h | INTRAVENOUS | Status: DC

## 2020-12-29 NOTE — Progress Notes (Signed)
ANTICOAGULATION CONSULT NOTE - Follow up [facility] for warfarin > heparin  Indication: pulmonary embolus  Allergies  Allergen Reactions   Augmentin [Amoxicillin-Pot Clavulanate] Itching and Other (See Comments)    Severe vaginal itching   Tranxene [Clorazepate] Itching   Crestor [Rosuvastatin]     Made her sick on stomach, she is able to tolerate the zocor   Penicillins Itching and Rash    Has patient had a PCN reaction causing immediate rash, facial/tongue/throat swelling, SOB or lightheadedness with hypotension: Yes Has patient had a PCN reaction causing severe rash involving mucus membranes or skin necrosis: No Has patient had a PCN reaction that required hospitalization: No Has patient had a PCN reaction occurring within the last 10 years: No If all of the above answers are "NO", then may proceed with Cephalosporin use.     Patient Measurements: Height: '5\' 7"'  (170.2 cm) Weight: 76.4 kg (168 lb 6.9 oz) IBW/kg (Calculated) : 61.6  Vital Signs: Temp: 98.1 F (36.7 C) (12/27 0700) Temp Source: Oral (12/27 0700) BP: 95/68 (12/27 0700) Pulse Rate: 91 (12/27 0700)  Labs: Recent Labs    12/27/20 0017 12/28/20 0145 12/28/20 1201 12/28/20 2239 12/29/20 0048 12/29/20 0815  HGB 13.0 13.6  --   --  12.2  --   HCT 40.9 43.2  --   --  40.2  --   PLT 204 231  --   --  216  --   LABPROT 25.0* 21.1*  --   --  22.2*  --   INR 2.3* 1.8*  --   --  1.9*  --   HEPARINUNFRC  --   --  0.74* >1.10*  --  1.05*  CREATININE 1.31* 1.47*  --  1.46* 1.47*  --      Estimated Creatinine Clearance: 33.1 mL/min (A) (by C-G formula based on SCr of 1.47 mg/dL (H)).   Medical History: Past Medical History:  Diagnosis Date   Acute massive pulmonary embolism ([place]) 07/13/2012   Massive PE w/ PEA arrest 07/13/12 >TNK >IVC filter >discharged on comadin     Anticoagulated on warfarin    Chronic diastolic CHF (congestive heart failure) ([place]) 03/15/2017   Chronic kidney disease, stage  3a ([place]) 12/15/2018   Diabetes mellitus, type 2 ([place]) 07/15/2012   with peripheral neuropathy   DM2 (diabetes mellitus, type 2) ([place]) 07/23/2012   Hyperlipemia 07/14/2012   Hyperlipidemia 02/25/2013   Hypertension 07/14/2012   Hypothyroid 06/06/2013   Large bowel stricture ([place])    s/p colectomy in 2016   Neuropathy 02/12/2015   Osteoarthritis    s/p hip and knee replacements   Pulmonary hypertension ([place]) 07/26/2016   Syncope 10/2018   Thyroid disease     Medications:  Medications Prior to Admission  Medication Sig Dispense Refill Last Dose   acetaminophen (TYLENOL) 500 MG tablet Take 500 mg by mouth every 6 (six) hours as needed for mild pain.    unk   diclofenac Sodium (VOLTAREN) 1 % GEL Apply 4 g topically 4 (four) times daily. (Patient taking differently: Apply 4 g topically daily as needed.)   unk   furosemide (LASIX) 20 MG tablet Take 1 tablet (20 mg total) by mouth daily as needed for fluid or edema. 30 tablet 3    levothyroxine (EUTHYROX) 25 MCG tablet Take 1 tablet (25 mcg total) by mouth daily before breakfast. 90 tablet 1 12/25/2020   lisinopril (ZESTRIL) 5 MG tablet Take 1 tablet (5 mg total) by mouth daily. [place]  tablet 1 12/25/2020   metFORMIN (GLUCOPHAGE-XR) 500 MG 24 hr tablet TAKE 1 TABLET BY MOUTH ONCE DAILY WITH BREAKFAST. PATIENT NEEDS AN APPT. 90 tablet 1 12/25/2020   metoprolol tartrate (LOPRESSOR) 25 MG tablet Take 1 tablet (25 mg total) by mouth 2 (two) times daily. 180 tablet 1 12/25/2020 at 0700   pantoprazole (PROTONIX) 40 MG tablet Take 1 tablet (40 mg total) by mouth daily. 30 tablet 1 12/25/2020   simvastatin (ZOCOR) 20 MG tablet Take 1 tablet (20 mg total) by mouth at bedtime. 90 tablet 1 12/24/2020   vitamin B-12 (CYANOCOBALAMIN) 1000 MCG tablet Take 1 tablet (1,000 mcg total) by mouth daily.   12/24/2020   vitamin C (ASCORBIC ACID) 500 MG tablet Take 500 mg by mouth daily.    12/24/2020   warfarin (COUMADIN) 5 MG tablet TAKE 1/2 TO 1 (ONE-HALF TO ONE) TABLET BY  MOUTH DAILY AS DIRECTED BY  COUMADIN  CLINIC (Patient taking differently: Take 2.5-5 mg by mouth See admin instructions. Taking 1/2 tablet (2.5 mg) Tues, thur, Sat & Sunday, and taking 1 tablet ( 35m) on Monday, Wed, & Friday.) 80 tablet 1 12/24/2020 at 1700   Blood Glucose Monitoring Suppl (ONETOUCH VERIO) w/Device KIT 1 each by Does not apply route as directed. 1 kit 0    glucose blood (ONETOUCH VERIO) test strip USE 1 STRIP TO CHECK GLUCOSE ONCE DAILY AS DIRECTED 100 each 3    OneTouch Delica Lancets 370YMISC USE 1  TO CHECK GLUCOSE ONCE DAILY 100 each 3     Assessment: 89YOF with h/o of PE/DVT on warfarin at home. INR on admission was therapeutic at 2.7. New low EF 35% so planning for cardiac cath. Warfarin has been held and heparin started.  Heparin level this morning remains elevated but trending down. Levels drawn appropriately per nursing. CBC stable this am, INR remains <2 at 1.9.  Goal of Therapy:  INR 2.5-3.5   per outpatient records  Heparin level 0.3-0.7 Monitor platelets by anticoagulation protocol: Yes   Plan:  -Hold heparin x1h then reduce rate to 400 units/h -Repeat heparin level in 8h  MArrie Senate PharmD, BEl Rancho BCastlewoodPharmacist [phone] check AMION for all MSextonvillenumbers 12/29/2020

## 2020-12-29 NOTE — H&P (View-Only) (Signed)
Progress Note  Patient Name: Hannah [name] Date of Encounter: 12/30/2020  [facility] Cardiologist: Dorris Carnes, MD   Subjective   Doing well this morning. No chest pain. Cough improved. Planned for RHC/LHC today.   Inpatient Medications    Scheduled Meds:  vitamin C  500 mg Oral Daily   aspirin EC  81 mg Oral Daily   atorvastatin  40 mg Oral Daily   insulin aspart  0-9 Units Subcutaneous TID WC   levothyroxine  25 mcg Oral QAC breakfast   metoprolol succinate  12.5 mg Oral Daily   pantoprazole  40 mg Oral Daily   sodium chloride flush  3 mL Intravenous Q12H   sodium chloride flush  3 mL Intravenous Q12H   zinc sulfate  220 mg Oral Daily   Continuous Infusions:  sodium chloride     sodium chloride     sodium chloride 30 mL/hr (12/30/20 0358)   heparin 350 Units/hr (12/30/20 0700)   remdesivir 100 mg in NS 100 mL 100 mg (12/29/20 1117)   PRN Meds: sodium chloride, sodium chloride, acetaminophen, albuterol, guaiFENesin-dextromethorphan, ondansetron **OR** ondansetron (ZOFRAN) IV, sodium chloride flush, sodium chloride flush   Vital Signs    Vitals:   12/29/20 2010 12/29/20 2346 12/30/20 0358 12/30/20 0730  BP: 138/67 138/76  128/70  Pulse: 93 83 80 80  Resp: 18 17 16 20   Temp: 97.7 F (36.5 C) 97.9 F (36.6 C) (!) 97.4 F (36.3 C) 97.6 F (36.4 C)  TempSrc: Oral Oral Oral Oral  SpO2: 98% 97% 98% 98%  Weight:      Height:        Intake/Output Summary (Last 24 hours) at 12/30/2020 0915 Last data filed at 12/30/2020 0731 Gross per 24 hour  Intake 1741.07 ml  Output 850 ml  Net 891.07 ml   Last 3 Weights 12/25/2020 12/25/2020 08/19/2020  Weight (lbs) 168 lb 6.9 oz 175 lb 172 lb 6.4 oz  Weight (kg) 76.4 kg 79.379 kg 78.2 kg      Telemetry    NSR/sinus tach - Personally Reviewed  ECG    No new tracing - Personally Reviewed  Physical Exam   GEN: No acute distress.   Neck: No JVD Cardiac: RRR, 2/6 systolic murmur  Respiratory: Clear to  auscultation bilaterally. GI: Soft, nontender, non-distended  MS: No edema; No deformity. Neuro:  Nonfocal  Psych: Normal affect   Labs    High Sensitivity Troponin:   Recent Labs  Lab 12/25/20 1241 12/25/20 1410 12/25/20 1700 12/25/20 1708  TROPONINIHS 8 77* 1,105* 1,222*     Chemistry Recent Labs  Lab 12/27/20 0017 12/27/20 0315 12/28/20 0145 12/28/20 2239 12/29/20 0048 12/30/20 0121  NA 134*  --  133* 135 133* 136  K 3.9  --  4.4 4.2 4.3 4.5  CL 105  --  105 107 108 110  CO2 20*  --  18* 18* 16* 18*  GLUCOSE 117*  --  92 107* 99 76  BUN 21  --  30* 38* 39* 38*  CREATININE 1.31*  --  1.47* 1.46* 1.47* 1.38*  CALCIUM 8.3*  --  8.0* 7.9* 7.9* 7.8*  MG  --  1.8  --  2.0  --   --   PROT 5.8*  --  5.8*  --   --   --   ALBUMIN 2.7*  --  2.6*  --   --   --   AST 30  --  30  --   --   --  ALT 12  --  13  --   --   --   ALKPHOS 45  --  42  --   --   --   BILITOT 0.5  --  0.4  --   --   --   GFRNONAA 41*  --  36* 36* 36* 39*  ANIONGAP 9  --  10 10 9 8     Lipids No results for input(s): CHOL, TRIG, HDL, LABVLDL, LDLCALC, CHOLHDL in the last 168 hours.  Hematology Recent Labs  Lab 12/28/20 0145 12/29/20 0048 12/30/20 0121  WBC 6.7 6.5 5.3  RBC 5.53* 5.11 5.21*  HGB 13.6 12.2 12.9  HCT 43.2 40.2 41.2  MCV 78.1* 78.7* 79.1*  MCH 24.6* 23.9* 24.8*  MCHC 31.5 30.3 31.3  RDW 20.5* 20.4* 20.6*  PLT 231 216 175   Thyroid No results for input(s): TSH, FREET4 in the last 168 hours.  BNP Recent Labs  Lab 12/25/20 1241  BNP 137.4*    DDimer  Recent Labs  Lab 12/25/20 1708  DDIMER 0.45     Radiology    No results found.  Cardiac Studies   TTE 12/26/20: IMPRESSIONS   1. Left ventricular ejection fraction, by estimation, is 30 to 35%. The  left ventricle has moderately decreased function. The left ventricle  demonstrates regional wall motion abnormalities (see scoring  diagram/findings for description). There is mild  left ventricular hypertrophy. Left  ventricular diastolic parameters are  consistent with Grade I diastolic dysfunction (impaired relaxation).  Elevated left ventricular end-diastolic pressure.   2. Right ventricular systolic function is normal. The right ventricular  size is normal. There is normal pulmonary artery systolic pressure. The  estimated right ventricular systolic pressure is 27.2 mmHg.   3. A small pericardial effusion is present. The pericardial effusion is  anterior to the right ventricle.   4. The mitral valve is grossly normal. Mild mitral valve regurgitation.  Moderate mitral annular calcification.   5. Tricuspid valve regurgitation is moderate.   6. The aortic valve is tricuspid. There is mild calcification of the  aortic valve. Aortic valve regurgitation is not visualized. Aortic valve  sclerosis/calcification is present, without any evidence of aortic  stenosis. Aortic valve mean gradient  measures 2.0 mmHg.   Comparison(s): Prior images reviewed side by side. LV dysfunction and wall  motion abnormalities are new in comparison.    LV Wall Scoring:  The mid and distal anterior septum, entire apex, and mid inferoseptal  segment  are akinetic. The mid anterolateral segment, mid anterior segment, and mid  inferior segment are hypokinetic. The posterior wall, basal anteroseptal  segment, basal anterolateral segment, basal anterior segment, basal  inferior  segment, and basal inferoseptal segment are normal.    Patient Profile     80 y.o. female with history of PEA arrest due to large PE in 2014 on chronic warfarin, HTN, HLD, DMII, large bowel stricture s/p colectomy with ostomy in place, and CKD IIIB who presented to the hospital with chest pain and elevated troponin for which Cardiology was consulted. Also noted to be COVID positive on admission. TTE with EF 30-35% with multiple WMA now awaiting LHC.  Assessment & Plan    #NSTEMI: #Chest Pain: Patient presented with chest pain with trop 1222.   TTE with LVEF 30-35% with regional WMA as detailed above and small pericardial effusion. Differential at this time remains broad and includes myopericarditis in the setting of COVID-19, ischemic etiology, vs stress cardiomyopathy. Plan for cath today  and if no obstructive disease, will need CMR.  -Plan for LHC/RHC today -If LHC without obstructive disease, will need CMR to assess for myopericarditis -Continue heparin gtt for [facility] -Continue ASA 81mg  daily -Contiue lipitor 40mg  daily -Continue metop 12.5mg  daily  #Newly Diagnosed HFrEF: TTE with LVEF 30-35% with multiple WMA as detailed above. Concern for ischemic vs myopericarditis vs stress CM as detailed above. Clinically euvolemic on examination. BP soft limiting GDMT. -Plan for LHC/RHC as above; if no obstructive disease, will need CMR -Continue metop 12.5mg  daily -Will add entresto vs ARB/spiro pending Bps and renal function following cath -Start farxiga 10mg  daily -Monitor I/Os and daily weights -Low Na diet  #Paroxysmal Afib/flutter: With episode of RVR on 12/26 now converted back to NSR/ST. On warfarin at home but heparin while awaiting LHC. -Continue heparin gtt for now; transition back to home warfarin following cath -Continue metop 12.5mg  daily -If recurrent RVR, will need to use amio for rate control due to soft BPs  #COVID 19 Positive: Found on admission with mild symptoms -On remdesivir  #Prior DVT/History of PE: Occurred in 2014 c/b PEA arrest. On lifelong warfarin at home, which has been transitioned to heparin as above. -Continue heparin gtt for now; transition back to home warfarin following cath  #HLD: -Continue atorvastatin 40mg  daily  #CKD IIIB: -Renally dose medications -Judicious use of contrast   INFORMED CONSENT: I have reviewed the risks, indications, and alternatives to cardiac catheterization, possible angioplasty, and stenting with the patient. Risks include but are not limited to bleeding, infection,  vascular injury, stroke, myocardial infection, arrhythmia, kidney injury, radiation-related injury in the case of prolonged fluoroscopy use, emergency cardiac surgery, and death. The patient understands the risks of serious complication is 1-2 in [id number] with diagnostic cardiac cath and 1-2% or less with angioplasty/stenting.     For questions or updates, please contact [place] Please consult [email or web address] for contact info under        Signed, Freada Bergeron, MD  12/30/2020, 9:15 AM

## 2020-12-29 NOTE — Progress Notes (Signed)
Physical Therapy Treatment Patient Details Name: Hannah [name] MRN: [id number] DOB: 09-Jul-1940 Today's Date: 12/29/2020   History of Present Illness Pt is a 80 y.o. female admitted 12/25/20 with chest pain. Pt with (+)COVID, elevated troponins with ?myocarditis or pericarditis associated with COVID. Plan for LHC/RHC on 12/27. PMH includes PEA arrest from large PE (2014) on Coumadin, DM2, HTN, HLD, large bowel stricture-s/p colectomy with ostomy in place.   PT Comments    Pt progressing with mobility. Today's session focused on ambulation for improving activity tolerance; pt ambulatory with rollator and intermittent min guard for balance. Pt remains limited by generalized weakness, decreased activity tolerance, poor balance strategies/postural reactions and impaired cognition. Recommend pt d/c to daughter's home, if able, for initial increased assist from family.    Recommendations for follow up therapy are one component of a multi-disciplinary discharge planning process, led by the attending physician.  Recommendations may be updated based on patient status, additional functional criteria and insurance authorization.  Follow Up Recommendations  Home health PT     Assistance Recommended at Discharge Intermittent Supervision/Assistance  Equipment Recommendations  None recommended by PT    Recommendations for Other Services       Precautions / Restrictions Precautions Precautions: Fall;Other (comment) Precaution Comments: ostomy Restrictions Weight Bearing Restrictions: No     Mobility  Bed Mobility Overal bed mobility: Modified Independent Bed Mobility: Supine to Sit      Transfers Overall transfer level: Needs assistance Equipment used: Rollator (4 wheels) Transfers: Sit to/from Stand Sit to Stand: Min guard     Step pivot transfers: Min guard     General transfer comment: min cues to lock rollator brakes, able to stand on 3rd trial with momentum to power up, min guard  for balance; decreased initiation noted    Ambulation/Gait Ambulation/Gait assistance: Min guard;Supervision Gait Distance (Feet): 72 Feet Assistive device: Rollator (4 wheels) Gait Pattern/deviations: Step-through pattern;Decreased stride length;Trunk flexed Gait velocity: Decreased     General Gait Details: Slow, steady gait with rollator and initial min guard for balance, progressing to supervision; pt ambulating laps in room (COVID precautions preventing hallway ambulation)   Stairs             Wheelchair Mobility    Modified Rankin (Stroke Patients Only)       Balance Overall balance assessment: Needs assistance Sitting-balance support: No upper extremity supported;Feet supported Sitting balance-Leahy Scale: Fair Sitting balance - Comments: BUE support to static sit   Standing balance support: Reliant on assistive device for balance;Bilateral upper extremity supported Standing balance-Leahy Scale: Fair Standing balance comment: can static stand without UE support to perform pericare; required cues to complete hygiene                            Cognition Arousal/Alertness: Awake/alert Behavior During Therapy: WFL for tasks assessed/performed;Flat affect Overall Cognitive Status: No family/caregiver present to determine baseline cognitive functioning                                 General Comments: Pt reports difficulty keeping track of date "being stuck in this room." Apparent slowed processing requiring cues to stay on and complete task, generally flat affect appearing confused; pt becoming much more alert and appropriately conversant when sitting up in chair and asked about family/grandkids        Exercises      General Comments General  comments (skin integrity, edema, etc.): pt reports aware of urine incontinence (although had not realized purewick was not in), but did not call "because I didn't want to bother them." Educ on need  for this and hygiene; pt required cues to fully complete pericare. Recommending pt stay at daughter's home a few days for increased assist, but pt with decreased awareness of potential need for this, stating, "When I'm home, I can do it all myself, and I have an aide"      Pertinent Vitals/Pain Pain Assessment: No/denies pain    Home Living                          Prior Function            PT Goals (current goals can now be found in the care plan section) Progress towards PT goals: Progressing toward goals    Frequency    Min 3X/week      PT Plan Current plan remains appropriate    Co-evaluation              AM-PAC PT "6 Clicks" Mobility   Outcome Measure  Help needed turning from your back to your side while in a flat bed without using bedrails?: None Help needed moving from lying on your back to sitting on the side of a flat bed without using bedrails?: A Little Help needed moving to and from a bed to a chair (including a wheelchair)?: A Little Help needed standing up from a chair using your arms (e.g., wheelchair or bedside chair)?: A Little Help needed to walk in hospital room?: A Little Help needed climbing 3-5 steps with a railing? : A Little 6 Click Score: 19    End of Session   Activity Tolerance: Patient tolerated treatment well Patient left: in chair;with call bell/phone within reach Nurse Communication: Mobility status PT Visit Diagnosis: Difficulty in walking, not elsewhere classified (R26.2);Muscle weakness (generalized) (M62.81)     Time: [id number] PT Time Calculation (min) (ACUTE ONLY): 21 min  Charges:  $Therapeutic Exercise: 8-22 mins                     Mabeline Caras, PT, DPT Acute Rehabilitation Services  Pager [phone] Office [place] 12/29/2020, 3:36 PM

## 2020-12-29 NOTE — Progress Notes (Signed)
ANTICOAGULATION CONSULT NOTE - Follow up [facility] for warfarin > heparin  Indication: pulmonary embolus  Allergies  Allergen Reactions   Augmentin [Amoxicillin-Pot Clavulanate] Itching and Other (See Comments)    Severe vaginal itching   Tranxene [Clorazepate] Itching   Crestor [Rosuvastatin]     Made her sick on stomach, she is able to tolerate the zocor   Penicillins Itching and Rash    Has patient had a PCN reaction causing immediate rash, facial/tongue/throat swelling, SOB or lightheadedness with hypotension: Yes Has patient had a PCN reaction causing severe rash involving mucus membranes or skin necrosis: No Has patient had a PCN reaction that required hospitalization: No Has patient had a PCN reaction occurring within the last 10 years: No If all of the above answers are "NO", then may proceed with Cephalosporin use.     Patient Measurements: Height: '5\' 7"'  (170.2 cm) Weight: 76.4 kg (168 lb 6.9 oz) IBW/kg (Calculated) : 61.6  Vital Signs: Temp: 98 F (36.7 C) (12/27 1635) Temp Source: Oral (12/27 1635) BP: 104/76 (12/27 1635) Pulse Rate: 98 (12/27 1635)  Labs: Recent Labs    12/27/20 0017 12/28/20 0145 12/28/20 1201 12/28/20 2239 12/29/20 0048 12/29/20 0815 12/29/20 1917  HGB 13.0 13.6  --   --  12.2  --   --   HCT 40.9 43.2  --   --  40.2  --   --   PLT 204 231  --   --  216  --   --   LABPROT 25.0* 21.1*  --   --  22.2*  --   --   INR 2.3* 1.8*  --   --  1.9*  --   --   HEPARINUNFRC  --   --    < > >1.10*  --  1.05* 0.71*  CREATININE 1.31* 1.47*  --  1.46* 1.47*  --   --    < > = values in this interval not displayed.    Estimated Creatinine Clearance: 33.1 mL/min (A) (by C-G formula based on SCr of 1.47 mg/dL (H)).   Medical History: Past Medical History:  Diagnosis Date   Acute massive pulmonary embolism ([place]) 07/13/2012   Massive PE w/ PEA arrest 07/13/12 >TNK >IVC filter >discharged on comadin     Anticoagulated on warfarin     Chronic diastolic CHF (congestive heart failure) ([place]) 03/15/2017   Chronic kidney disease, stage 3a ([place]) 12/15/2018   Diabetes mellitus, type 2 ([place]) 07/15/2012   with peripheral neuropathy   DM2 (diabetes mellitus, type 2) ([place]) 07/23/2012   Hyperlipemia 07/14/2012   Hyperlipidemia 02/25/2013   Hypertension 07/14/2012   Hypothyroid 06/06/2013   Large bowel stricture ([place])    s/p colectomy in 2016   Neuropathy 02/12/2015   Osteoarthritis    s/p hip and knee replacements   Pulmonary hypertension ([place]) 07/26/2016   Syncope 10/2018   Thyroid disease     Medications:  Medications Prior to Admission  Medication Sig Dispense Refill Last Dose   acetaminophen (TYLENOL) 500 MG tablet Take 500 mg by mouth every 6 (six) hours as needed for mild pain.    unk   diclofenac Sodium (VOLTAREN) 1 % GEL Apply 4 g topically 4 (four) times daily. (Patient taking differently: Apply 4 g topically daily as needed.)   unk   furosemide (LASIX) 20 MG tablet Take 1 tablet (20 mg total) by mouth daily as needed for fluid or edema. 30 tablet 3    levothyroxine (EUTHYROX) 25  MCG tablet Take 1 tablet (25 mcg total) by mouth daily before breakfast. 90 tablet 1 12/25/2020   lisinopril (ZESTRIL) 5 MG tablet Take 1 tablet (5 mg total) by mouth daily. 90 tablet 1 12/25/2020   metFORMIN (GLUCOPHAGE-XR) 500 MG 24 hr tablet TAKE 1 TABLET BY MOUTH ONCE DAILY WITH BREAKFAST. PATIENT NEEDS AN APPT. 90 tablet 1 12/25/2020   metoprolol tartrate (LOPRESSOR) 25 MG tablet Take 1 tablet (25 mg total) by mouth 2 (two) times daily. 180 tablet 1 12/25/2020 at 0700   pantoprazole (PROTONIX) 40 MG tablet Take 1 tablet (40 mg total) by mouth daily. 30 tablet 1 12/25/2020   simvastatin (ZOCOR) 20 MG tablet Take 1 tablet (20 mg total) by mouth at bedtime. 90 tablet 1 12/24/2020   vitamin B-12 (CYANOCOBALAMIN) 1000 MCG tablet Take 1 tablet (1,000 mcg total) by mouth daily.   12/24/2020   vitamin C (ASCORBIC ACID) 500 MG tablet Take 500 mg by mouth  daily.    12/24/2020   warfarin (COUMADIN) 5 MG tablet TAKE 1/2 TO 1 (ONE-HALF TO ONE) TABLET BY MOUTH DAILY AS DIRECTED BY  COUMADIN  CLINIC (Patient taking differently: Take 2.5-5 mg by mouth See admin instructions. Taking 1/2 tablet (2.5 mg) Tues, thur, Sat & Sunday, and taking 1 tablet ( 70m) on Monday, Wed, & Friday.) 80 tablet 1 12/24/2020 at 1700   Blood Glucose Monitoring Suppl (ONETOUCH VERIO) w/Device KIT 1 each by Does not apply route as directed. 1 kit 0    glucose blood (ONETOUCH VERIO) test strip USE 1 STRIP TO CHECK GLUCOSE ONCE DAILY AS DIRECTED 100 each 3    OneTouch Delica Lancets 311DMISC USE 1  TO CHECK GLUCOSE ONCE DAILY 100 each 3     Assessment: 89YOF with h/o of PE/DVT on warfarin at home. INR on admission was therapeutic at 2.7. New low EF 35% so planning for cardiac cath. Warfarin has been held and heparin started.  Heparin level this morning remains elevated but trending down. Levels drawn appropriately per nursing. CBC stable this am, INR remains <2 at 1.9.  PM UPDATE: Heparin level trending down to goal at 0.71 after holding and reducing rate. Will reduce further. Plan for cath in AM.   Goal of Therapy:  INR 2.5-3.5   per outpatient records  Heparin level 0.3-0.7 Monitor platelets by anticoagulation protocol: Yes   Plan:  -Reduce Heparin to 350 units/hr.  -Follow-up post cath   JSloan Leiter PharmD, BCPS, BCCCP Clinical Pharmacist [facility] 12/29/2020

## 2020-12-29 NOTE — Plan of Care (Signed)

## 2020-12-29 NOTE — Progress Notes (Signed)
Occupational Therapy Treatment Patient Details Name: Hannah [name] MRN: [id number] DOB: 04/12/1940 Today's Date: 12/29/2020   History of present illness Pt is a 80 y.o. female admitted 12/25/20 with chest pain. Pt with (+)COVID, elevated troponins with ?myocarditis or pericarditis associated with COVID. Plan for LHC/RHC on 12/27. PMH includes PEA arrest due to a large PE in 2014-on anticoagulation with Coumadin, DM-2, HTN, HLD, large bowel stricture-s/p colectomy with ostomy in place.   OT comments  Pt progressing towards acute OT goals. Completed simulated toilet transfer to [facility] (step-pivot transfer to recliner). Mod A with bed mobility, up to min A with functional transfers. Sats dropped to 76 during OOB activity, recovered into low 90s quickly once seated. Pt in no acute distress. Up in recliner at end of session.    Recommendations for follow up therapy are one component of a multi-disciplinary discharge planning process, led by the attending physician.  Recommendations may be updated based on patient status, additional functional criteria and insurance authorization.    Follow Up Recommendations  Skilled nursing-short term rehab (<3 hours/day)    Assistance Recommended at Discharge Frequent or constant Supervision/Assistance  Equipment Recommendations  Other (comment) (defer to next venue)    Recommendations for Other Services      Precautions / Restrictions Precautions Precautions: Fall Restrictions Weight Bearing Restrictions: No       Mobility Bed Mobility Overal bed mobility: Needs Assistance Bed Mobility: Supine to Sit     Supine to sit: HOB elevated;Mod assist     General bed mobility comments: sequencing cues for setup with hospital bed. assist to fully advance hips to EOB position    Transfers Overall transfer level: Needs assistance Equipment used: Rolling walker (2 wheels) Transfers: Sit to/from Stand;Bed to chair/wheelchair/BSC Sit to Stand: From elevated  surface;Min assist   Step pivot transfers: Min guard       General transfer comment: EOB to recliner. assist to steady. extra time and effort. Sequencing cues, some decreased inititation     Balance Overall balance assessment: Needs assistance Sitting-balance support: Feet supported;Bilateral upper extremity supported Sitting balance-Leahy Scale: Poor Sitting balance - Comments: BUE support to static sit   Standing balance support: Reliant on assistive device for balance;Bilateral upper extremity supported Standing balance-Leahy Scale: Poor Standing balance comment: rw and up to min A to steady                           ADL either performed or assessed with clinical judgement   ADL Overall ADL's : Needs assistance/impaired                         Toilet Transfer: Moderate assistance;Stand-pivot             General ADL Comments: Pivotal steps to sit in recliner.    Extremity/Trunk Assessment Upper Extremity Assessment Upper Extremity Assessment: Generalized weakness   Lower Extremity Assessment Lower Extremity Assessment: Defer to PT evaluation        Vision       Perception     Praxis      Cognition Arousal/Alertness: Awake/alert Behavior During Therapy: Flat affect Overall Cognitive Status: No family/caregiver present to determine baseline cognitive functioning                                 General Comments: slow processing. some sequencing deficits.  Exercises     Shoulder Instructions       General Comments sats dropped to 76 during OOB activity, recovered into low 90s quickly once seated. Pt in no acute distress.    Pertinent Vitals/ Pain       Pain Assessment: No/denies pain  Home Living                                          Prior Functioning/Environment              Frequency  Min 2X/week        Progress Toward Goals  OT Goals(current goals can now be found  in the care plan section)  Progress towards OT goals: Progressing toward goals  Acute Rehab OT Goals Patient Stated Goal: home OT Goal Formulation: With patient Time For Goal Achievement: 01/09/21 Potential to Achieve Goals: Fair ADL Goals Pt Will Perform Lower Body Bathing: with min guard assist;sitting/lateral leans;sit to/from stand Pt Will Perform Lower Body Dressing: with min guard assist;sitting/lateral leans;sit to/from stand Pt Will Transfer to Toilet: with min assist;ambulating Pt Will Perform Toileting - Clothing Manipulation and hygiene: with min guard assist;sitting/lateral leans;sit to/from stand Additional ADL Goal #1: Pt will complete bed mobility with min guard as a precursor to seated ADL's. Additional ADL Goal #2: Pt will sit EOB with no assist for 3 mins as a precursor to seated tasks.  Plan Discharge plan remains appropriate    Co-evaluation                 AM-PAC OT "6 Clicks" Daily Activity     Outcome Measure   Help from another person eating meals?: A Little Help from another person taking care of personal grooming?: A Little Help from another person toileting, which includes using toliet, bedpan, or urinal?: A Lot Help from another person bathing (including washing, rinsing, drying)?: A Lot Help from another person to put on and taking off regular upper body clothing?: A Little Help from another person to put on and taking off regular lower body clothing?: A Lot 6 Click Score: 15    End of Session Equipment Utilized During Treatment: Rolling walker (2 wheels)  OT Visit Diagnosis: Unsteadiness on feet (R26.81);Other abnormalities of gait and mobility (R26.89);Muscle weakness (generalized) (M62.81)   Activity Tolerance Patient limited by fatigue;Patient tolerated treatment well   Patient Left in chair;with call bell/phone within reach;with chair alarm set   Nurse Communication Mobility status;Other (comment) (sats dipped during transfer)         Time: [id number] OT Time Calculation (min): 22 min  Charges: OT General Charges $OT Visit: 1 Visit OT Treatments $Self Care/Home Management : 8-22 mins  Tyrone Schimke, OT Acute Rehabilitation Services Office: [phone]   Hortencia Pilar 12/29/2020, 2:12 PM

## 2020-12-29 NOTE — Progress Notes (Signed)
Progress Note  Patient Name: Hannah [name] Date of Encounter: 12/30/2020  [facility] Cardiologist: Dorris Carnes, MD   Subjective   Doing well this morning. No chest pain. Cough improved. Planned for RHC/LHC today.   Inpatient Medications    Scheduled Meds:  vitamin C  500 mg Oral Daily   aspirin EC  81 mg Oral Daily   atorvastatin  40 mg Oral Daily   insulin aspart  0-9 Units Subcutaneous TID WC   levothyroxine  25 mcg Oral QAC breakfast   metoprolol succinate  12.5 mg Oral Daily   pantoprazole  40 mg Oral Daily   sodium chloride flush  3 mL Intravenous Q12H   sodium chloride flush  3 mL Intravenous Q12H   zinc sulfate  220 mg Oral Daily   Continuous Infusions:  sodium chloride     sodium chloride     sodium chloride 30 mL/hr (12/30/20 0358)   heparin 350 Units/hr (12/30/20 0700)   remdesivir 100 mg in NS 100 mL 100 mg (12/29/20 1117)   PRN Meds: sodium chloride, sodium chloride, acetaminophen, albuterol, guaiFENesin-dextromethorphan, ondansetron **OR** ondansetron (ZOFRAN) IV, sodium chloride flush, sodium chloride flush   Vital Signs    Vitals:   12/29/20 2010 12/29/20 2346 12/30/20 0358 12/30/20 0730  BP: 138/67 138/76  128/70  Pulse: 93 83 80 80  Resp: 18 17 16 20   Temp: 97.7 F (36.5 C) 97.9 F (36.6 C) (!) 97.4 F (36.3 C) 97.6 F (36.4 C)  TempSrc: Oral Oral Oral Oral  SpO2: 98% 97% 98% 98%  Weight:      Height:        Intake/Output Summary (Last 24 hours) at 12/30/2020 0915 Last data filed at 12/30/2020 0731 Gross per 24 hour  Intake 1741.07 ml  Output 850 ml  Net 891.07 ml   Last 3 Weights 12/25/2020 12/25/2020 08/19/2020  Weight (lbs) 168 lb 6.9 oz 175 lb 172 lb 6.4 oz  Weight (kg) 76.4 kg 79.379 kg 78.2 kg      Telemetry    NSR/sinus tach - Personally Reviewed  ECG    No new tracing - Personally Reviewed  Physical Exam   GEN: No acute distress.   Neck: No JVD Cardiac: RRR, 2/6 systolic murmur  Respiratory: Clear to  auscultation bilaterally. GI: Soft, nontender, non-distended  MS: No edema; No deformity. Neuro:  Nonfocal  Psych: Normal affect   Labs    High Sensitivity Troponin:   Recent Labs  Lab 12/25/20 1241 12/25/20 1410 12/25/20 1700 12/25/20 1708  TROPONINIHS 8 77* 1,105* 1,222*     Chemistry Recent Labs  Lab 12/27/20 0017 12/27/20 0315 12/28/20 0145 12/28/20 2239 12/29/20 0048 12/30/20 0121  NA 134*  --  133* 135 133* 136  K 3.9  --  4.4 4.2 4.3 4.5  CL 105  --  105 107 108 110  CO2 20*  --  18* 18* 16* 18*  GLUCOSE 117*  --  92 107* 99 76  BUN 21  --  30* 38* 39* 38*  CREATININE 1.31*  --  1.47* 1.46* 1.47* 1.38*  CALCIUM 8.3*  --  8.0* 7.9* 7.9* 7.8*  MG  --  1.8  --  2.0  --   --   PROT 5.8*  --  5.8*  --   --   --   ALBUMIN 2.7*  --  2.6*  --   --   --   AST 30  --  30  --   --   --  ALT 12  --  13  --   --   --   ALKPHOS 45  --  42  --   --   --   BILITOT 0.5  --  0.4  --   --   --   GFRNONAA 41*  --  36* 36* 36* 39*  ANIONGAP 9  --  10 10 9 8     Lipids No results for input(s): CHOL, TRIG, HDL, LABVLDL, LDLCALC, CHOLHDL in the last 168 hours.  Hematology Recent Labs  Lab 12/28/20 0145 12/29/20 0048 12/30/20 0121  WBC 6.7 6.5 5.3  RBC 5.53* 5.11 5.21*  HGB 13.6 12.2 12.9  HCT 43.2 40.2 41.2  MCV 78.1* 78.7* 79.1*  MCH 24.6* 23.9* 24.8*  MCHC 31.5 30.3 31.3  RDW 20.5* 20.4* 20.6*  PLT 231 216 175   Thyroid No results for input(s): TSH, FREET4 in the last 168 hours.  BNP Recent Labs  Lab 12/25/20 1241  BNP 137.4*    DDimer  Recent Labs  Lab 12/25/20 1708  DDIMER 0.45     Radiology    No results found.  Cardiac Studies   TTE 12/26/20: IMPRESSIONS   1. Left ventricular ejection fraction, by estimation, is 30 to 35%. The  left ventricle has moderately decreased function. The left ventricle  demonstrates regional wall motion abnormalities (see scoring  diagram/findings for description). There is mild  left ventricular hypertrophy. Left  ventricular diastolic parameters are  consistent with Grade I diastolic dysfunction (impaired relaxation).  Elevated left ventricular end-diastolic pressure.   2. Right ventricular systolic function is normal. The right ventricular  size is normal. There is normal pulmonary artery systolic pressure. The  estimated right ventricular systolic pressure is 27.2 mmHg.   3. A small pericardial effusion is present. The pericardial effusion is  anterior to the right ventricle.   4. The mitral valve is grossly normal. Mild mitral valve regurgitation.  Moderate mitral annular calcification.   5. Tricuspid valve regurgitation is moderate.   6. The aortic valve is tricuspid. There is mild calcification of the  aortic valve. Aortic valve regurgitation is not visualized. Aortic valve  sclerosis/calcification is present, without any evidence of aortic  stenosis. Aortic valve mean gradient  measures 2.0 mmHg.   Comparison(s): Prior images reviewed side by side. LV dysfunction and wall  motion abnormalities are new in comparison.    LV Wall Scoring:  The mid and distal anterior septum, entire apex, and mid inferoseptal  segment  are akinetic. The mid anterolateral segment, mid anterior segment, and mid  inferior segment are hypokinetic. The posterior wall, basal anteroseptal  segment, basal anterolateral segment, basal anterior segment, basal  inferior  segment, and basal inferoseptal segment are normal.    Patient Profile     80 y.o. female with history of PEA arrest due to large PE in 2014 on chronic warfarin, HTN, HLD, DMII, large bowel stricture s/p colectomy with ostomy in place, and CKD IIIB who presented to the hospital with chest pain and elevated troponin for which Cardiology was consulted. Also noted to be COVID positive on admission. TTE with EF 30-35% with multiple WMA now awaiting LHC.  Assessment & Plan    #NSTEMI: #Chest Pain: Patient presented with chest pain with trop 1222.   TTE with LVEF 30-35% with regional WMA as detailed above and small pericardial effusion. Differential at this time remains broad and includes myopericarditis in the setting of COVID-19, ischemic etiology, vs stress cardiomyopathy. Plan for cath today  and if no obstructive disease, will need CMR.  -Plan for LHC/RHC today -If LHC without obstructive disease, will need CMR to assess for myopericarditis -Continue heparin gtt for [facility] -Continue ASA 81mg  daily -Contiue lipitor 40mg  daily -Continue metop 12.5mg  daily  #Newly Diagnosed HFrEF: TTE with LVEF 30-35% with multiple WMA as detailed above. Concern for ischemic vs myopericarditis vs stress CM as detailed above. Clinically euvolemic on examination. BP soft limiting GDMT. -Plan for LHC/RHC as above; if no obstructive disease, will need CMR -Continue metop 12.5mg  daily -Will add entresto vs ARB/spiro pending Bps and renal function following cath -Start farxiga 10mg  daily -Monitor I/Os and daily weights -Low Na diet  #Paroxysmal Afib/flutter: With episode of RVR on 12/26 now converted back to NSR/ST. On warfarin at home but heparin while awaiting LHC. -Continue heparin gtt for now; transition back to home warfarin following cath -Continue metop 12.5mg  daily -If recurrent RVR, will need to use amio for rate control due to soft BPs  #COVID 19 Positive: Found on admission with mild symptoms -On remdesivir  #Prior DVT/History of PE: Occurred in 2014 c/b PEA arrest. On lifelong warfarin at home, which has been transitioned to heparin as above. -Continue heparin gtt for now; transition back to home warfarin following cath  #HLD: -Continue atorvastatin 40mg  daily  #CKD IIIB: -Renally dose medications -Judicious use of contrast   INFORMED CONSENT: I have reviewed the risks, indications, and alternatives to cardiac catheterization, possible angioplasty, and stenting with the patient. Risks include but are not limited to bleeding, infection,  vascular injury, stroke, myocardial infection, arrhythmia, kidney injury, radiation-related injury in the case of prolonged fluoroscopy use, emergency cardiac surgery, and death. The patient understands the risks of serious complication is 1-2 in [id number] with diagnostic cardiac cath and 1-2% or less with angioplasty/stenting.     For questions or updates, please contact Joseph Please consult [email or web address] for contact info under        Signed, Freada Bergeron, MD  12/30/2020, 9:15 AM

## 2020-12-29 NOTE — Progress Notes (Signed)
PROGRESS NOTE        PATIENT DETAILS Name: Hannah [name] Age: 80 y.o. Sex: female Date of Birth: Nov 22, 1940 Admit Date: 12/25/2020 Admitting Physician Dewayne Shorter Levora Dredge, MD GUR:[id number], Paris Lore, MD  Brief Narrative: Patient is a 80 y.o. female with history of PEA arrest due to a large PE in 2014-on anticoagulation with Coumadin, DM-2, HTN, HLD, large bowel stricture-s/p colectomy with ostomy in place-presented to the hospital with chest pain.  See below for further details.  Subjective: No chest pain or shortness of breath.  Objective: Vitals: Blood pressure 95/68, pulse 91, temperature 98.1 F (36.7 C), temperature source Oral, resp. rate 19, height 5\' 7"  (1.702 m), weight 76.4 kg, SpO2 95 %.   Exam: Gen Exam:Alert awake-not in any distress HEENT:atraumatic, normocephalic Chest: B/L clear to auscultation anteriorly CVS:S1S2 regular Abdomen:soft non tender, non distended Extremities:no edema Neurology: Non focal Skin: no rash   Pertinent Labs/Radiology: Recent Labs  Lab 12/27/20 0017 12/28/20 0145 12/28/20 2239 12/29/20 0048  WBC 7.6 6.7  --  6.5  HGB 13.0 13.6  --  12.2  PLT 204 231  --  216  NA 134* 133*   < > 133*  K 3.9 4.4   < > 4.3  CREATININE 1.31* 1.47*   < > 1.47*  AST 30 30  --   --   ALT 12 13  --   --   ALKPHOS 45 42  --   --   BILITOT 0.5 0.4  --   --    < > = values in this interval not displayed.    Assessment/Plan: Non-STEMI versus myocarditis: Presented with chest pain/troponin elevation-echo with suppressed EF and numerous wall motion abnormalities.  Given COVID-19 infection-myocarditis also in the differential.  Chest pain-free this morning.  Remains on IV heparin/aspirin/statin/beta-blocker-cardiology planning-with plans for [facility] tomorrow morning.      COVID-19 infection: No respiratory symptoms-see concern for myocarditis-on Remdesivir.  CXR negative for PNA.  CT value 23 on COVID PCR.    Recent Labs     12/27/20 0017  CRP 10.0*    Newly diagnosed systolic heart failure: BP remains soft-volume status is stable-lisinopril on hold-continue metoprolol-RHC being planned tomorrow.   PAF: Maintaining sinus rhythm-if goes back into RVR-plan is to start amiodarone.  On beta-blocker and full dose anticoagulation.  History of PEA arrest due to massive pulmonary embolism: Was on Coumadin-now on hold-on IV heparin   CKD stage IIIa: Creatinine stable-watch closely.  HTN: BP soft this morning-continue metoprolol-no longer on lisinopril.  HLD: Continue statin.  Hypothyroidism: Continue Synthroid  DM-2 (A1c 6.09 August 2020): CBG stable-continue SSI  Recent Labs    12/28/20 1655 12/28/20 2125 12/29/20 0609  GLUCAP 119* 124* 103*     Chronic debility/deconditioning: At baseline-walks with the help of a walker-not very ambulatory.  PT/OT eval will be ordered.  BMI Estimated body mass index is 26.38 kg/m as calculated from the following:   Height as of this encounter: 5\' 7"  (1.702 m).   Weight as of this encounter: 76.4 kg.    Procedures: None Consults: Cardiology DVT Prophylaxis: Coumadin/heparin Code Status:Full code  Family Communication: Daughter-[phone]-updated over the phone on 12/25  Time spent: 35 minutes-Greater than 50% of this time was spent in counseling, explanation of diagnosis, planning of further management, and coordination of care.   Disposition Plan: Status is: Inpatient.  The patient will require care spanning > 2 midnights and should be moved to inpatient because: Chest pain-possible non-STEMI-COVID-febrile this morning-checking cultures-starting IV Remdesivir-not yet stable for discharge.   Diet: Diet Order             Diet NPO time specified Except for: Sips with Meds  Diet effective midnight                     Antimicrobial agents: Anti-infectives (From admission, onward)    Start     Dose/Rate Route Frequency Ordered Stop    12/27/20 1000  remdesivir 100 mg in sodium chloride 0.9 % 100 mL IVPB       See Hyperspace for full Linked Orders Report.   100 mg 200 mL/hr over 30 Minutes Intravenous Daily 12/26/20 1500 12/31/20 0959   12/26/20 1600  remdesivir 200 mg in sodium chloride 0.9% 250 mL IVPB       See Hyperspace for full Linked Orders Report.   200 mg 580 mL/hr over 30 Minutes Intravenous Once 12/26/20 1500 12/27/20 2355        MEDICATIONS: Scheduled Meds:  vitamin C  500 mg Oral Daily   aspirin EC  81 mg Oral Daily   atorvastatin  40 mg Oral Daily   insulin aspart  0-9 Units Subcutaneous TID WC   levothyroxine  25 mcg Oral QAC breakfast   metoprolol succinate  12.5 mg Oral Daily   pantoprazole  40 mg Oral Daily   sodium chloride flush  3 mL Intravenous Q12H   zinc sulfate  220 mg Oral Daily   Continuous Infusions:  sodium chloride     sodium chloride 75 mL/hr at 12/29/20 [id number]   heparin 400 Units/hr (12/29/20 1020)   remdesivir 100 mg in NS 100 mL 200 mL/hr at 12/28/20 2210   PRN Meds:.sodium chloride, acetaminophen, albuterol, guaiFENesin-dextromethorphan, ondansetron **OR** ondansetron (ZOFRAN) IV, sodium chloride flush   I have personally reviewed following labs and imaging studies  LABORATORY DATA: CBC: Recent Labs  Lab 12/25/20 1241 12/26/20 0741 12/27/20 0017 12/28/20 0145 12/29/20 0048  WBC 11.0* 9.3 7.6 6.7 6.5  NEUTROABS 8.6*  --   --   --   --   HGB 13.6 12.9 13.0 13.6 12.2  HCT 44.6 42.4 40.9 43.2 40.2  MCV 81.4 78.5* 78.4* 78.1* 78.7*  PLT 294 229 204 231 216     Basic Metabolic Panel: Recent Labs  Lab 12/26/20 0741 12/27/20 0017 12/27/20 0315 12/28/20 0145 12/28/20 2239 12/29/20 0048  NA 134* 134*  --  133* 135 133*  K 4.2 3.9  --  4.4 4.2 4.3  CL 102 105  --  105 107 108  CO2 23 20*  --  18* 18* 16*  GLUCOSE 102* 117*  --  92 107* 99  BUN 16 21  --  30* 38* 39*  CREATININE 1.23* 1.31*  --  1.47* 1.46* 1.47*  CALCIUM 8.9 8.3*  --  8.0* 7.9* 7.9*  MG   --   --  1.8  --  2.0  --      GFR: Estimated Creatinine Clearance: 33.1 mL/min (A) (by C-G formula based on SCr of 1.47 mg/dL (H)).  Liver Function Tests: Recent Labs  Lab 12/27/20 0017 12/28/20 0145  AST 30 30  ALT 12 13  ALKPHOS 45 42  BILITOT 0.5 0.4  PROT 5.8* 5.8*  ALBUMIN 2.7* 2.6*    No results for input(s): LIPASE, AMYLASE in the last 168 hours.  No results for input(s): AMMONIA in the last 168 hours.  Coagulation Profile: Recent Labs  Lab 12/25/20 1410 12/26/20 0114 12/27/20 0017 12/28/20 0145 12/29/20 0048  INR 2.7* 2.8* 2.3* 1.8* 1.9*     Cardiac Enzymes: No results for input(s): CKTOTAL, CKMB, CKMBINDEX, TROPONINI in the last 168 hours.  BNP (last 3 results) No results for input(s): PROBNP in the last 8760 hours.  Lipid Profile: No results for input(s): CHOL, HDL, LDLCALC, TRIG, CHOLHDL, LDLDIRECT in the last 72 hours.  Thyroid Function Tests: No results for input(s): TSH, T4TOTAL, FREET4, T3FREE, THYROIDAB in the last 72 hours.  Anemia Panel: No results for input(s): VITAMINB12, FOLATE, FERRITIN, TIBC, IRON, RETICCTPCT in the last 72 hours.   Urine analysis:    Component Value Date/Time   COLORURINE YELLOW 12/26/2020 1104   APPEARANCEUR CLEAR 12/26/2020 1104   LABSPEC 1.010 12/26/2020 1104   PHURINE 8.5 (H) 12/26/2020 1104   GLUCOSEU NEGATIVE 12/26/2020 1104   HGBUR SMALL (A) 12/26/2020 1104   BILIRUBINUR NEGATIVE 12/26/2020 1104   KETONESUR NEGATIVE 12/26/2020 1104   PROTEINUR NEGATIVE 12/26/2020 1104   UROBILINOGEN 0.2 03/14/2017 1102   NITRITE POSITIVE (A) 12/26/2020 1104   LEUKOCYTESUR NEGATIVE 12/26/2020 1104    Sepsis Labs: Lactic Acid, Venous    Component Value Date/Time   LATICACIDVEN 0.86 12/15/2017 2122    MICROBIOLOGY: Recent Results (from the past 240 hour(s))  Resp Panel by RT-PCR (Flu A&B, Covid) Nasopharyngeal Swab     Status: Abnormal   Collection Time: 12/25/20 12:23 PM   Specimen: Nasopharyngeal Swab;  Nasopharyngeal(NP) swabs in vial transport medium  Result Value Ref Range Status   SARS Coronavirus 2 by RT PCR POSITIVE (A) NEGATIVE Final    Comment: (NOTE) SARS-CoV-2 target nucleic acids are DETECTED.  The SARS-CoV-2 RNA is generally detectable in upper respiratory specimens during the acute phase of infection. Positive results are indicative of the presence of the identified virus, but do not rule out bacterial infection or co-infection with other pathogens not detected by the test. Clinical correlation with patient history and other diagnostic information is necessary to determine patient infection status. The expected result is Negative.  Fact Sheet for Patients: EntrepreneurPulse.com.au  Fact Sheet for Healthcare Providers: IncredibleEmployment.be  This test is not yet approved or cleared by the [place] FDA and  has been authorized for detection and/or diagnosis of SARS-CoV-2 by FDA under an Emergency Use Authorization (EUA).  This EUA will remain in effect (meaning this test can be used) for the duration of  the COVID-19 declaration under Section 564(b)(1) of the A ct, 21 U.S.C. section 360bbb-3(b)(1), unless the authorization is terminated or revoked sooner.     Influenza A by PCR NEGATIVE NEGATIVE Final   Influenza B by PCR NEGATIVE NEGATIVE Final    Comment: (NOTE) The Xpert Xpress SARS-CoV-2/FLU/RSV plus assay is intended as an aid in the diagnosis of influenza from Nasopharyngeal swab specimens and should not be used as a sole basis for treatment. Nasal washings and aspirates are unacceptable for Xpert Xpress SARS-CoV-2/FLU/RSV testing.  Fact Sheet for Patients: EntrepreneurPulse.com.au  Fact Sheet for Healthcare Providers: IncredibleEmployment.be  This test is not yet approved or cleared by the [place] FDA and has been authorized for detection and/or diagnosis of SARS-CoV-2  by FDA under an Emergency Use Authorization (EUA). This EUA will remain in effect (meaning this test can be used) for the duration of the COVID-19 declaration under Section 564(b)(1) of the Act, 21 U.S.C. section 360bbb-3(b)(1), unless the authorization is  terminated or revoked.  Performed at [facility], [place] [address] [place], [place] [address]   MRSA Next Gen by PCR, Nasal     Status: None   Collection Time: 12/26/20 12:20 AM   Specimen: Nasal Mucosa; Nasal Swab  Result Value Ref Range Status   MRSA by PCR Next Gen NOT DETECTED NOT DETECTED Final    Comment: (NOTE) The GeneXpert MRSA Assay (FDA approved for NASAL specimens only), is one component of a comprehensive MRSA colonization surveillance program. It is not intended to diagnose MRSA infection nor to guide or monitor treatment for MRSA infections. Test performance is not FDA approved in patients less than 49 years old. Performed at [facility], [place] [address], [place], [place] [address]   Culture, blood (routine x 2)     Status: None (Preliminary result)   Collection Time: 12/26/20  2:29 PM   Specimen: BLOOD LEFT HAND  Result Value Ref Range Status   Specimen Description BLOOD LEFT HAND  Final   Special Requests   Final    BOTTLES DRAWN AEROBIC AND ANAEROBIC Blood Culture results may not be optimal due to an inadequate volume of blood received in culture bottles   Culture   Final    NO GROWTH 3 DAYS Performed at [facility], [place] [address], [place], [place] [address]    Report Status PENDING  Incomplete  Culture, blood (routine x 2)     Status: None (Preliminary result)   Collection Time: 12/26/20  2:40 PM   Specimen: BLOOD LEFT WRIST  Result Value Ref Range Status   Specimen Description BLOOD LEFT WRIST  Final   Special Requests   Final    BOTTLES DRAWN AEROBIC ONLY Blood Culture results may not be optimal due to an inadequate volume of blood received in culture bottles   Culture   Final     NO GROWTH 3 DAYS Performed at [facility], [place] [address], [place], [place] [address]    Report Status PENDING  Incomplete    RADIOLOGY STUDIES/RESULTS: No results found.   LOS: 2 days   Oren Binet, MD  [facility]    To contact the attending provider between 7A-7P or the covering provider during after hours 7P-7A, please log into the web site [email or web address] and access using universal [place] password for that web site. If you do not have the password, please call the hospital operator.  12/29/2020, 11:05 AM

## 2020-12-29 NOTE — Progress Notes (Signed)
°   12/28/20 2226  Notify: Charge Nurse/RN  Name of Charge Nurse/RN Notified shiwangi (Primary Nurse is charge, notified teamworker)  Date Charge Nurse/RN Notified 12/28/20  Time Charge Nurse/RN Notified 2225  Notify: Provider  Provider Name/Title Dr Gavin Potters  Date Provider Notified 12/28/20  Time Provider Notified 2225  Notification Type Page  Notification Reason Red med refusal  Provider response See new orders  Date of Provider Response 12/28/20  Time of Provider Response 2226   Pt asymptomatic - MD aware, he advised that he will be by this shift to see the Pt.  He asked that I call if the Pt did become symptomatic.

## 2020-12-30 ENCOUNTER — Other Ambulatory Visit (HOSPITAL_COMMUNITY): Payer: Self-pay

## 2020-12-30 ENCOUNTER — Inpatient Hospital Stay (HOSPITAL_COMMUNITY)
Admission: EM | Disposition: A | Discharge status: Home Health | Payer: Self-pay | Source: Home / Self Care | Attending: Internal Medicine

## 2020-12-30 DIAGNOSIS — E7849 Other hyperlipidemia: Secondary | ICD-10-CM | POA: Diagnosis not present

## 2020-12-30 DIAGNOSIS — I10 Essential (primary) hypertension: Secondary | ICD-10-CM | POA: Diagnosis not present

## 2020-12-30 DIAGNOSIS — I5032 Chronic diastolic (congestive) heart failure: Secondary | ICD-10-CM | POA: Diagnosis not present

## 2020-12-30 DIAGNOSIS — I420 Dilated cardiomyopathy: Secondary | ICD-10-CM | POA: Diagnosis not present

## 2020-12-30 DIAGNOSIS — I214 Non-ST elevation (NSTEMI) myocardial infarction: Secondary | ICD-10-CM | POA: Diagnosis not present

## 2020-12-30 DIAGNOSIS — R072 Precordial pain: Secondary | ICD-10-CM | POA: Diagnosis not present

## 2020-12-30 HISTORY — PX: RIGHT HEART CATH: CATH118263

## 2020-12-30 LAB — BASIC METABOLIC PANEL
Anion gap: 8 (ref 5–15)
BUN: 38 mg/dL — ABNORMAL HIGH (ref 8–23)
CO2: 18 mmol/L — ABNORMAL LOW (ref 22–32)
Calcium: 7.8 mg/dL — ABNORMAL LOW (ref 8.9–10.3)
Chloride: 110 mmol/L (ref 98–111)
Creatinine, Ser: 1.38 mg/dL — ABNORMAL HIGH (ref 0.44–1.00)
GFR, Estimated: 39 mL/min — ABNORMAL LOW (ref >60)
Glucose, Bld: 76 mg/dL (ref 70–99)
Potassium: 4.5 mmol/L (ref 3.5–5.1)
Sodium: 136 mmol/L (ref 135–145)

## 2020-12-30 LAB — POCT I-STAT 7, (LYTES, BLD GAS, ICA,H+H)
Acid-base deficit: 6 mmol/L — ABNORMAL HIGH (ref 0.0–2.0)
Bicarbonate: 18.7 mmol/L — ABNORMAL LOW (ref 20.0–28.0)
Calcium, Ion: 1.08 mmol/L — ABNORMAL LOW (ref 1.15–1.40)
HCT: 35 % — ABNORMAL LOW (ref 36.0–46.0)
Hemoglobin: 11.9 g/dL — ABNORMAL LOW (ref 12.0–15.0)
O2 Saturation: 95 %
Potassium: 4.2 mmol/L (ref 3.5–5.1)
Sodium: 140 mmol/L (ref 135–145)
TCO2: 20 mmol/L — ABNORMAL LOW (ref 22–32)
pCO2 arterial: 34.4 mmHg (ref 32.0–48.0)
pH, Arterial: 7.343 — ABNORMAL LOW (ref 7.350–7.450)
pO2, Arterial: 80 mmHg — ABNORMAL LOW (ref 83.0–108.0)

## 2020-12-30 LAB — CBC
HCT: 41.2 % (ref 36.0–46.0)
Hemoglobin: 12.9 g/dL (ref 12.0–15.0)
MCH: 24.8 pg — ABNORMAL LOW (ref 26.0–34.0)
MCHC: 31.3 g/dL (ref 30.0–36.0)
MCV: 79.1 fL — ABNORMAL LOW (ref 80.0–100.0)
Platelets: 175 10*3/uL (ref 150–400)
RBC: 5.21 MIL/uL — ABNORMAL HIGH (ref 3.87–5.11)
RDW: 20.6 % — ABNORMAL HIGH (ref 11.5–15.5)
WBC: 5.3 10*3/uL (ref 4.0–10.5)
nRBC: 0 % (ref 0.0–0.2)

## 2020-12-30 LAB — POCT I-STAT EG7
Acid-base deficit: 6 mmol/L — ABNORMAL HIGH (ref 0.0–2.0)
Bicarbonate: 18.8 mmol/L — ABNORMAL LOW (ref 20.0–28.0)
Calcium, Ion: 1.09 mmol/L — ABNORMAL LOW (ref 1.15–1.40)
HCT: 36 % (ref 36.0–46.0)
Hemoglobin: 12.2 g/dL (ref 12.0–15.0)
O2 Saturation: 69 %
Potassium: 4.1 mmol/L (ref 3.5–5.1)
Sodium: 141 mmol/L (ref 135–145)
TCO2: 20 mmol/L — ABNORMAL LOW (ref 22–32)
pCO2, Ven: 36 mmHg — ABNORMAL LOW (ref 44.0–60.0)
pH, Ven: 7.326 (ref 7.250–7.430)
pO2, Ven: 38 mmHg (ref 32.0–45.0)

## 2020-12-30 LAB — GLUCOSE, CAPILLARY
Glucose-Capillary: 76 mg/dL (ref 70–99)
Glucose-Capillary: 81 mg/dL (ref 70–99)

## 2020-12-30 LAB — HEPARIN LEVEL (UNFRACTIONATED): Heparin Unfractionated: 0.57 IU/mL (ref 0.30–0.70)

## 2020-12-30 LAB — PROTIME-INR
INR: 1.8 — ABNORMAL HIGH (ref 0.8–1.2)
Prothrombin Time: 21.1 seconds — ABNORMAL HIGH (ref 11.4–15.2)

## 2020-12-30 LAB — POCT ACTIVATED CLOTTING TIME: Activated Clotting Time: 161 seconds

## 2020-12-30 SURGERY — RIGHT HEART CATH

## 2020-12-30 MED ORDER — SODIUM CHLORIDE 0.9% FLUSH: 3.0000 mL | INTRAVENOUS | Status: DC | PRN

## 2020-12-30 MED ORDER — SODIUM CHLORIDE 0.9 % IV SOLN: 250.0000 mL | INTRAVENOUS | Status: DC | PRN

## 2020-12-30 MED ORDER — SODIUM CHLORIDE 0.9% FLUSH
3.0000 mL | Freq: Two times a day (BID) | INTRAVENOUS | Status: DC
  Administered 2020-12-30 – 2021-01-03 (×4): 3 mL via INTRAVENOUS

## 2020-12-30 MED ORDER — LIDOCAINE HCL (PF) 1 % IJ SOLN
INTRAMUSCULAR | Status: DC | PRN
  Administered 2020-12-30: 15 mL
  Administered 2020-12-30: 2 mL

## 2020-12-30 MED ORDER — MIDAZOLAM HCL 2 MG/2ML IJ SOLN
INTRAMUSCULAR | Status: DC | PRN
  Administered 2020-12-30 (×2): 1 mg via INTRAVENOUS

## 2020-12-30 MED ORDER — HEPARIN SODIUM (PORCINE) 1000 UNIT/ML IJ SOLN
INTRAMUSCULAR | Status: AC
  Filled 2020-12-30: qty 10

## 2020-12-30 MED ORDER — DAPAGLIFLOZIN PROPANEDIOL 10 MG PO TABS
10.0000 mg | ORAL_TABLET | Freq: Every day | ORAL | Status: DC
  Administered 2020-12-30 – 2021-01-04 (×5): 10 mg via ORAL
  Filled 2020-12-30 (×7): qty 1

## 2020-12-30 MED ORDER — HEPARIN (PORCINE) IN NACL 1000-0.9 UT/500ML-% IV SOLN
INTRAVENOUS | Status: AC
  Filled 2020-12-30: qty 1000

## 2020-12-30 MED ORDER — HEPARIN (PORCINE) IN NACL 1000-0.9 UT/500ML-% IV SOLN
INTRAVENOUS | Status: DC | PRN
  Administered 2020-12-30 (×2): 500 mL

## 2020-12-30 MED ORDER — FENTANYL CITRATE (PF) 100 MCG/2ML IJ SOLN
INTRAMUSCULAR | Status: AC
  Filled 2020-12-30: qty 2

## 2020-12-30 MED ORDER — VERAPAMIL HCL 2.5 MG/ML IV SOLN
INTRAVENOUS | Status: DC | PRN
  Administered 2020-12-30: 17:00:00 10 mL via INTRA_ARTERIAL

## 2020-12-30 MED ORDER — HYDRALAZINE HCL 20 MG/ML IJ SOLN: 10.0000 mg | INTRAMUSCULAR | Status: AC | PRN

## 2020-12-30 MED ORDER — SODIUM CHLORIDE 0.9 % IV SOLN: INTRAVENOUS | Status: AC

## 2020-12-30 MED ORDER — MIDAZOLAM HCL 2 MG/2ML IJ SOLN
INTRAMUSCULAR | Status: AC
  Filled 2020-12-30: qty 2

## 2020-12-30 MED ORDER — HYDRALAZINE HCL 20 MG/ML IJ SOLN
INTRAMUSCULAR | Status: DC | PRN
  Administered 2020-12-30: 10 mg via INTRAVENOUS

## 2020-12-30 MED ORDER — HYDRALAZINE HCL 20 MG/ML IJ SOLN
INTRAMUSCULAR | Status: AC
  Filled 2020-12-30: qty 1

## 2020-12-30 MED ORDER — SODIUM CHLORIDE 0.9% FLUSH
3.0000 mL | Freq: Two times a day (BID) | INTRAVENOUS | Status: DC
  Administered 2021-01-01: 09:00:00 3 mL via INTRAVENOUS

## 2020-12-30 MED ORDER — LIDOCAINE HCL (PF) 1 % IJ SOLN
INTRAMUSCULAR | Status: AC
  Filled 2020-12-30: qty 30

## 2020-12-30 MED ORDER — LABETALOL HCL 5 MG/ML IV SOLN: 10.0000 mg | INTRAVENOUS | Status: AC | PRN

## 2020-12-30 MED ORDER — VERAPAMIL HCL 2.5 MG/ML IV SOLN
INTRAVENOUS | Status: AC
  Filled 2020-12-30: qty 2

## 2020-12-30 MED ORDER — ASPIRIN 81 MG PO CHEW
81.0000 mg | CHEWABLE_TABLET | ORAL | Status: AC
  Administered 2020-12-31: 06:00:00 81 mg via ORAL
  Filled 2020-12-30: qty 1

## 2020-12-30 MED ORDER — FENTANYL CITRATE (PF) 100 MCG/2ML IJ SOLN
INTRAMUSCULAR | Status: DC | PRN
  Administered 2020-12-30 (×2): 25 ug via INTRAVENOUS

## 2020-12-30 MED ORDER — IOHEXOL 350 MG/ML SOLN
INTRAVENOUS | Status: DC | PRN
  Administered 2020-12-30: 17:00:00 5 mL

## 2020-12-30 SURGICAL SUPPLY — 15 items
CATH INFINITI JR4 5F (CATHETERS) ×1 IMPLANT
CATH SWAN GANZ 7F STRAIGHT (CATHETERS) ×1 IMPLANT
DEVICE RAD COMP TR BAND LRG (VASCULAR PRODUCTS) ×1 IMPLANT
GLIDESHEATH SLEND SS 6F .021 (SHEATH) ×1 IMPLANT
GUIDEWIRE .025 260CM (WIRE) ×1 IMPLANT
GUIDEWIRE INQWIRE 1.5J.035X260 (WIRE) IMPLANT
INQWIRE 1.5J .035X260CM (WIRE) ×2
KIT HEART LEFT (KITS) ×3 IMPLANT
PACK CARDIAC CATHETERIZATION (CUSTOM PROCEDURE TRAY) ×3 IMPLANT
SHEATH GLIDE SLENDER 4/5FR (SHEATH) ×1 IMPLANT
SHEATH PINNACLE 7F 10CM (SHEATH) ×1 IMPLANT
SYR MEDRAD MARK 7 150ML (SYRINGE) ×3 IMPLANT
TRANSDUCER W/STOPCOCK (MISCELLANEOUS) ×3 IMPLANT
TUBING CIL FLEX 10 FLL-RA (TUBING) ×3 IMPLANT
WIRE HI TORQ VERSACORE-J 145CM (WIRE) ×1 IMPLANT

## 2020-12-30 NOTE — Progress Notes (Signed)
PROGRESS NOTE        PATIENT DETAILS Name: Hannah [name] Age: 80 y.o. Sex: female Date of Birth: [birth year] Admit Date: 12/25/2020 Admitting Physician Evalee Mutton Kristeen Mans, MD [id number], Steele Berg, MD  Brief Narrative: Patient is a 80 y.o. female with history of PEA arrest due to a large PE in 2014-on anticoagulation with Coumadin, DM-2, HTN, HLD, large bowel stricture-s/p colectomy with ostomy in place-presented to the hospital with chest pain.  See below for further details.  Subjective: Lying comfortably in bed-no chest pain or shortness of breath.  Objective: Vitals: Blood pressure (!) 106/93, pulse 74, temperature 98 F (36.7 C), temperature source Oral, resp. rate 20, height 5\' 7"  (1.702 m), weight 76.4 kg, SpO2 99 %.   Exam: Gen Exam:Alert awake-not in any distress HEENT:atraumatic, normocephalic Chest: B/L clear to auscultation anteriorly CVS:S1S2 regular Abdomen:soft non tender, non distended Extremities:no edema Neurology: Non focal Skin: no rash   Pertinent Labs/Radiology: Recent Labs  Lab 12/27/20 0017 12/28/20 0145 12/28/20 2239 12/30/20 0121  WBC 7.6 6.7   < > 5.3  HGB 13.0 13.6   < > 12.9  PLT 204 231   < > 175  NA 134* 133*   < > 136  K 3.9 4.4   < > 4.5  CREATININE 1.31* 1.47*   < > 1.38*  AST 30 30  --   --   ALT 12 13  --   --   ALKPHOS 45 42  --   --   BILITOT 0.5 0.4  --   --    < > = values in this interval not displayed.    Assessment/Plan: Non-STEMI versus myocarditis: Presented with chest pain/troponin elevation-echo with suppressed EF and numerous wall motion abnormalities.  Given COVID-19 infection-myocarditis also in the differential.  Chest pain has resolved-being medically managed with IV heparin/aspirin/statin/beta-blocker-cardiology planning RHC/LHC today.  Marland Kitchen      COVID-19 infection: No respiratory symptoms-see concern for myocarditis-on Remdesivir.  CXR negative for PNA.  CT value 23 on COVID PCR.    No  results for input(s): DDIMER, FERRITIN, LDH, CRP in the last 72 hours.  Newly diagnosed systolic heart failure: Volume status stable-on metoprolol.  Cardiology planning to add other agents once LHC/RHC is completed.  PAF: Maintaining sinus rhythm-if goes back into RVR-plan is to start amiodarone.  On beta-blocker and full dose anticoagulation.  History of PEA arrest due to massive pulmonary embolism: Was on Coumadin-now on hold-on IV heparin   CKD stage IIIa: Creatinine stable-watch closely.  HTN: BP soft this morning-continue metoprolol-no longer on lisinopril.  HLD: Continue statin.  Hypothyroidism: Continue Synthroid  DM-2 (A1c 6.09 August 2020): CBG stable-continue SSI  Recent Labs    12/29/20 1637 12/29/20 2120 12/30/20 0648  GLUCAP 139* 80 76     Chronic debility/deconditioning: At baseline-walks with the help of a walker-not very ambulatory.  PT/OT eval will be ordered.  BMI Estimated body mass index is 26.38 kg/m as calculated from the following:   Height as of this encounter: 5\' 7"  (1.702 m).   Weight as of this encounter: 76.4 kg.    Procedures: None Consults: Cardiology DVT Prophylaxis: Coumadin/heparin Code Status:Full code  Family Communication: Daughter-Michele-[phone]-updated over the phone on 12/28  Time spent: 25 minutes-Greater than 50% of this time was spent in counseling, explanation of diagnosis, planning of further management, and coordination of  care.   Disposition Plan: Status is: Inpatient.  The patient will require care spanning > 2 midnights and should be moved to inpatient because: Chest pain-possible non-STEMI-COVID-febrile this morning-checking cultures-starting IV Remdesivir-not yet stable for discharge.   Diet: Diet Order             Diet NPO time specified  Diet effective midnight                     Antimicrobial agents: Anti-infectives (From admission, onward)    Start     Dose/Rate Route Frequency Ordered Stop    12/27/20 1000  remdesivir 100 mg in sodium chloride 0.9 % 100 mL IVPB       See Hyperspace for full Linked Orders Report.   100 mg 200 mL/hr over 30 Minutes Intravenous Daily 12/26/20 1500 12/31/20 0959   12/26/20 1600  remdesivir 200 mg in sodium chloride 0.9% 250 mL IVPB       See Hyperspace for full Linked Orders Report.   200 mg 580 mL/hr over 30 Minutes Intravenous Once 12/26/20 1500 12/27/20 2355        MEDICATIONS: Scheduled Meds:  vitamin C  500 mg Oral Daily   aspirin EC  81 mg Oral Daily   atorvastatin  40 mg Oral Daily   dapagliflozin propanediol  10 mg Oral Daily   insulin aspart  0-9 Units Subcutaneous TID WC   levothyroxine  25 mcg Oral QAC breakfast   metoprolol succinate  12.5 mg Oral Daily   pantoprazole  40 mg Oral Daily   sodium chloride flush  3 mL Intravenous Q12H   sodium chloride flush  3 mL Intravenous Q12H   zinc sulfate  220 mg Oral Daily   Continuous Infusions:  sodium chloride     sodium chloride     sodium chloride 30 mL/hr (12/30/20 0358)   heparin 350 Units/hr (12/30/20 0700)   remdesivir 100 mg in NS 100 mL 100 mg (12/30/20 1131)   PRN Meds:.sodium chloride, sodium chloride, acetaminophen, albuterol, guaiFENesin-dextromethorphan, ondansetron **OR** ondansetron (ZOFRAN) IV, sodium chloride flush, sodium chloride flush   I have personally reviewed following labs and imaging studies  LABORATORY DATA: CBC: Recent Labs  Lab 12/25/20 1241 12/26/20 0741 12/27/20 0017 12/28/20 0145 12/29/20 0048 12/30/20 0121  WBC 11.0* 9.3 7.6 6.7 6.5 5.3  NEUTROABS 8.6*  --   --   --   --   --   HGB 13.6 12.9 13.0 13.6 12.2 12.9  HCT 44.6 42.4 40.9 43.2 40.2 41.2  MCV 81.4 78.5* 78.4* 78.1* 78.7* 79.1*  PLT 294 229 204 231 216 175     Basic Metabolic Panel: Recent Labs  Lab 12/27/20 0017 12/27/20 0315 12/28/20 0145 12/28/20 2239 12/29/20 0048 12/30/20 0121  NA 134*  --  133* 135 133* 136  K 3.9  --  4.4 4.2 4.3 4.5  CL 105  --  105 107  108 110  CO2 20*  --  18* 18* 16* 18*  GLUCOSE 117*  --  92 107* 99 76  BUN 21  --  30* 38* 39* 38*  CREATININE 1.31*  --  1.47* 1.46* 1.47* 1.38*  CALCIUM 8.3*  --  8.0* 7.9* 7.9* 7.8*  MG  --  1.8  --  2.0  --   --      GFR: Estimated Creatinine Clearance: 35.2 mL/min (A) (by C-G formula based on SCr of 1.38 mg/dL (H)).  Liver Function Tests: Recent Labs  Lab  12/27/20 0017 12/28/20 0145  AST 30 30  ALT 12 13  ALKPHOS 45 42  BILITOT 0.5 0.4  PROT 5.8* 5.8*  ALBUMIN 2.7* 2.6*    No results for input(s): LIPASE, AMYLASE in the last 168 hours. No results for input(s): AMMONIA in the last 168 hours.  Coagulation Profile: Recent Labs  Lab 12/26/20 0114 12/27/20 0017 12/28/20 0145 12/29/20 0048 12/30/20 0121  INR 2.8* 2.3* 1.8* 1.9* 1.8*     Cardiac Enzymes: No results for input(s): CKTOTAL, CKMB, CKMBINDEX, TROPONINI in the last 168 hours.  BNP (last 3 results) No results for input(s): PROBNP in the last 8760 hours.  Lipid Profile: No results for input(s): CHOL, HDL, LDLCALC, TRIG, CHOLHDL, LDLDIRECT in the last 72 hours.  Thyroid Function Tests: No results for input(s): TSH, T4TOTAL, FREET4, T3FREE, THYROIDAB in the last 72 hours.  Anemia Panel: No results for input(s): VITAMINB12, FOLATE, FERRITIN, TIBC, IRON, RETICCTPCT in the last 72 hours.   Urine analysis:    Component Value Date/Time   COLORURINE YELLOW 12/26/2020 1104   APPEARANCEUR CLEAR 12/26/2020 1104   LABSPEC 1.010 12/26/2020 1104   PHURINE 8.5 (H) 12/26/2020 1104   GLUCOSEU NEGATIVE 12/26/2020 1104   HGBUR SMALL (A) 12/26/2020 1104   BILIRUBINUR NEGATIVE 12/26/2020 1104   KETONESUR NEGATIVE 12/26/2020 1104   PROTEINUR NEGATIVE 12/26/2020 1104   UROBILINOGEN 0.2 03/14/2017 1102   NITRITE POSITIVE (A) 12/26/2020 1104   LEUKOCYTESUR NEGATIVE 12/26/2020 1104    Sepsis Labs: Lactic Acid, Venous    Component Value Date/Time   LATICACIDVEN 0.86 12/15/2017 2122     MICROBIOLOGY: Recent Results (from the past 240 hour(s))  Resp Panel by RT-PCR (Flu A&B, Covid) Nasopharyngeal Swab     Status: Abnormal   Collection Time: 12/25/20 12:23 PM   Specimen: Nasopharyngeal Swab; Nasopharyngeal(NP) swabs in vial transport medium  Result Value Ref Range Status   SARS Coronavirus 2 by RT PCR POSITIVE (A) NEGATIVE Final    Comment: (NOTE) SARS-CoV-2 target nucleic acids are DETECTED.  The SARS-CoV-2 RNA is generally detectable in upper respiratory specimens during the acute phase of infection. Positive results are indicative of the presence of the identified virus, but do not rule out bacterial infection or co-infection with other pathogens not detected by the test. Clinical correlation with patient history and other diagnostic information is necessary to determine patient infection status. The expected result is Negative.  Fact Sheet for Patients: EntrepreneurPulse.com.au  Fact Sheet for Healthcare Providers: IncredibleEmployment.be  This test is not yet approved or cleared by the [place] FDA and  has been authorized for detection and/or diagnosis of SARS-CoV-2 by FDA under an Emergency Use Authorization (EUA).  This EUA will remain in effect (meaning this test can be used) for the duration of  the COVID-19 declaration under Section 564(b)(1) of the A ct, 21 U.S.C. section 360bbb-3(b)(1), unless the authorization is terminated or revoked sooner.     Influenza A by PCR NEGATIVE NEGATIVE Final   Influenza B by PCR NEGATIVE NEGATIVE Final    Comment: (NOTE) The Xpert Xpress SARS-CoV-2/FLU/RSV plus assay is intended as an aid in the diagnosis of influenza from Nasopharyngeal swab specimens and should not be used as a sole basis for treatment. Nasal washings and aspirates are unacceptable for Xpert Xpress SARS-CoV-2/FLU/RSV testing.  Fact Sheet for Patients: EntrepreneurPulse.com.au  Fact  Sheet for Healthcare Providers: IncredibleEmployment.be  This test is not yet approved or cleared by the [place] FDA and has been authorized for detection and/or diagnosis of  SARS-CoV-2 by FDA under an Emergency Use Authorization (EUA). This EUA will remain in effect (meaning this test can be used) for the duration of the COVID-19 declaration under Section 564(b)(1) of the Act, 21 U.S.C. section 360bbb-3(b)(1), unless the authorization is terminated or revoked.  Performed at [facility], [address] [place], [place] [address]   MRSA Next Gen by PCR, Nasal     Status: None   Collection Time: 12/26/20 12:20 AM   Specimen: Nasal Mucosa; Nasal Swab  Result Value Ref Range Status   MRSA by PCR Next Gen NOT DETECTED NOT DETECTED Final    Comment: (NOTE) The GeneXpert MRSA Assay (FDA approved for NASAL specimens only), is one component of a comprehensive MRSA colonization surveillance program. It is not intended to diagnose MRSA infection nor to guide or monitor treatment for MRSA infections. Test performance is not FDA approved in patients less than 51 years old. Performed at [facility], [address] [place], [place] [address]   Culture, blood (routine x 2)     Status: None (Preliminary result)   Collection Time: 12/26/20  2:29 PM   Specimen: BLOOD LEFT HAND  Result Value Ref Range Status   Specimen Description BLOOD LEFT HAND  Final   Special Requests   Final    BOTTLES DRAWN AEROBIC AND ANAEROBIC Blood Culture results may not be optimal due to an inadequate volume of blood received in culture bottles   Culture   Final    NO GROWTH 4 DAYS Performed at [facility], [address] [place], [place] [address]    Report Status PENDING  Incomplete  Culture, blood (routine x 2)     Status: None (Preliminary result)   Collection Time: 12/26/20  2:40 PM   Specimen: BLOOD LEFT WRIST  Result Value Ref Range Status   Specimen  Description BLOOD LEFT WRIST  Final   Special Requests   Final    BOTTLES DRAWN AEROBIC ONLY Blood Culture results may not be optimal due to an inadequate volume of blood received in culture bottles   Culture   Final    NO GROWTH 4 DAYS Performed at [facility], [address], [place], [place] [address]    Report Status PENDING  Incomplete    RADIOLOGY STUDIES/RESULTS: No results found.   LOS: 3 days   Jeoffrey Massed, MD  [facility]    To contact the attending provider between 7A-7P or the covering provider during after hours 7P-7A, please log into the web site [email or web address] and access using universal [place] password for that web site. If you do not have the password, please call the hospital operator.  12/30/2020, 11:53 AM

## 2020-12-30 NOTE — Progress Notes (Signed)
ANTICOAGULATION CONSULT NOTE - Follow up [place] for warfarin > heparin  Indication: pulmonary embolus  Allergies  Allergen Reactions   Augmentin [Amoxicillin-Pot Clavulanate] Itching and Other (See Comments)    Severe vaginal itching   Tranxene [Clorazepate] Itching   Crestor [Rosuvastatin]     Made her sick on stomach, she is able to tolerate the zocor   Penicillins Itching and Rash    Has patient had a PCN reaction causing immediate rash, facial/tongue/throat swelling, SOB or lightheadedness with hypotension: Yes Has patient had a PCN reaction causing severe rash involving mucus membranes or skin necrosis: No Has patient had a PCN reaction that required hospitalization: No Has patient had a PCN reaction occurring within the last 10 years: No If all of the above answers are "NO", then may proceed with Cephalosporin use.     Patient Measurements: Height: '5\' 7"'  (170.2 cm) Weight: 76.4 kg (168 lb 6.9 oz) IBW/kg (Calculated) : 61.6  Vital Signs: Temp: 97.6 F (36.4 C) (12/28 0730) Temp Source: Oral (12/28 0730) BP: 128/70 (12/28 0730) Pulse Rate: 80 (12/28 0730)  Labs: Recent Labs    12/28/20 0145 12/28/20 1201 12/28/20 2239 12/29/20 0048 12/29/20 0815 12/29/20 1917 12/30/20 0121  HGB 13.6  --   --  12.2  --   --  12.9  HCT 43.2  --   --  40.2  --   --  41.2  PLT 231  --   --  216  --   --  175  LABPROT 21.1*  --   --  22.2*  --   --  21.1*  INR 1.8*  --   --  1.9*  --   --  1.8*  HEPARINUNFRC  --    < > >1.10*  --  1.05* 0.71* 0.57  CREATININE 1.47*  --  1.46* 1.47*  --   --  1.38*   < > = values in this interval not displayed.     Estimated Creatinine Clearance: 35.2 mL/min (A) (by C-G formula based on SCr of 1.38 mg/dL (H)).   Medical History: Past Medical History:  Diagnosis Date   Acute massive pulmonary embolism ([place]) 07/13/2012   Massive PE w/ PEA arrest 07/13/12 >TNK >IVC filter >discharged on comadin     Anticoagulated on warfarin     Chronic diastolic CHF (congestive heart failure) ([place]) 03/15/2017   Chronic kidney disease, stage 3a ([place]) 12/15/2018   Diabetes mellitus, type 2 ([place]) 07/15/2012   with peripheral neuropathy   DM2 (diabetes mellitus, type 2) ([place]) 07/23/2012   Hyperlipemia 07/14/2012   Hyperlipidemia 02/25/2013   Hypertension 07/14/2012   Hypothyroid 06/06/2013   Large bowel stricture ([place])    s/p colectomy in 2016   Neuropathy 02/12/2015   Osteoarthritis    s/p hip and knee replacements   Pulmonary hypertension ([place]) 07/26/2016   Syncope 10/2018   Thyroid disease     Medications:  Medications Prior to Admission  Medication Sig Dispense Refill Last Dose   acetaminophen (TYLENOL) 500 MG tablet Take 500 mg by mouth every 6 (six) hours as needed for mild pain.    unk   diclofenac Sodium (VOLTAREN) 1 % GEL Apply 4 g topically 4 (four) times daily. (Patient taking differently: Apply 4 g topically daily as needed.)   unk   furosemide (LASIX) 20 MG tablet Take 1 tablet (20 mg total) by mouth daily as needed for fluid or edema. 30 tablet 3    levothyroxine (EUTHYROX) 25 MCG  tablet Take 1 tablet (25 mcg total) by mouth daily before breakfast. 90 tablet 1 12/25/2020   lisinopril (ZESTRIL) 5 MG tablet Take 1 tablet (5 mg total) by mouth daily. 90 tablet 1 12/25/2020   metFORMIN (GLUCOPHAGE-XR) 500 MG 24 hr tablet TAKE 1 TABLET BY MOUTH ONCE DAILY WITH BREAKFAST. PATIENT NEEDS AN APPT. 90 tablet 1 12/25/2020   metoprolol tartrate (LOPRESSOR) 25 MG tablet Take 1 tablet (25 mg total) by mouth 2 (two) times daily. 180 tablet 1 12/25/2020 at 0700   pantoprazole (PROTONIX) 40 MG tablet Take 1 tablet (40 mg total) by mouth daily. 30 tablet 1 12/25/2020   simvastatin (ZOCOR) 20 MG tablet Take 1 tablet (20 mg total) by mouth at bedtime. 90 tablet 1 12/24/2020   vitamin B-12 (CYANOCOBALAMIN) 1000 MCG tablet Take 1 tablet (1,000 mcg total) by mouth daily.   12/24/2020   vitamin C (ASCORBIC ACID) 500 MG tablet Take 500 mg by mouth  daily.    12/24/2020   warfarin (COUMADIN) 5 MG tablet TAKE 1/2 TO 1 (ONE-HALF TO ONE) TABLET BY MOUTH DAILY AS DIRECTED BY  COUMADIN  CLINIC (Patient taking differently: Take 2.5-5 mg by mouth See admin instructions. Taking 1/2 tablet (2.5 mg) Tues, thur, Sat & Sunday, and taking 1 tablet ( 68m) on Monday, Wed, & Friday.) 80 tablet 1 12/24/2020 at 1700   Blood Glucose Monitoring Suppl (ONETOUCH VERIO) w/Device KIT 1 each by Does not apply route as directed. 1 kit 0    glucose blood (ONETOUCH VERIO) test strip USE 1 STRIP TO CHECK GLUCOSE ONCE DAILY AS DIRECTED 100 each 3    OneTouch Delica Lancets 301SMISC USE 1  TO CHECK GLUCOSE ONCE DAILY 100 each 3     Assessment: 89YOF with h/o of PE/DVT on warfarin at home. INR on admission was therapeutic at 2.7. New low EF 35% so planning for cardiac cath. Warfarin has been held and heparin started.  Heparin level now therapeutic at 0.57, H/H stable. Pltc down slightly.  Goal of Therapy:  Heparin level 0.3-0.7 Monitor platelets by anticoagulation protocol: Yes   Plan:  -Continue heparin 350 units/h -F/U resuming warfarin post/cath   MArrie Senate PharmD, BCPS, BMaimonides Medical CenterClinical Pharmacist [phone] check AMION for all MWest Chicagonumbers 12/30/2020

## 2020-12-30 NOTE — Interval H&P Note (Signed)
History and Physical Interval Note:  12/30/2020 3:57 PM  Hannah [name]  has presented today for surgery, with the diagnosis of chest pain.  The various methods of treatment have been discussed with the patient and family. After consideration of risks, benefits and other options for treatment, the patient has consented to  Procedure(s): RIGHT/LEFT HEART CATH AND CORONARY ANGIOGRAPHY (N/A) as a surgical intervention.  The patient's history has been reviewed, patient examined, no change in status, stable for surgery.  I have reviewed the patient's chart and labs.  Questions were answered to the patient's satisfaction.    Cath Lab Visit (complete for each Cath Lab visit)  Clinical Evaluation Leading to the Procedure:   ACS: Yes.    Non-ACS:    Anginal Classification: CCS III  Anti-ischemic medical therapy: Minimal Therapy (1 class of medications)  Non-Invasive Test Results: No non-invasive testing performed  Prior CABG: No previous CABG        Verne Carrow

## 2020-12-30 NOTE — Progress Notes (Signed)
Mobility Specialist Progress Note    12/30/20 1039  Mobility  Activity Ambulated in room  Level of Assistance Minimal assist, patient does 75% or more  Assistive Device Front wheel walker  Distance Ambulated (ft) 100 ft  Mobility Ambulated with assistance in room  Mobility Response Tolerated fair  Mobility performed by Mobility specialist  $Mobility charge 1 Mobility   Pt received in bed and agreeable. Needed to elevate bed for STS. No complaints on walk. Returned to bed with call bell in reach.   [facility] Mobility Specialist  M.S. Primary Phone: [phone] M.S. Secondary Phone: [phone]

## 2020-12-30 NOTE — TOC Benefit Eligibility Note (Signed)
Patient Product/process development scientist completed.    The patient is currently admitted and upon discharge could be taking Farxiga 10 mg.  The current 30 day co-pay is, $47.00.   The patient is currently admitted and upon discharge could be taking Entresto 24-26 mg.  The current 30 day co-pay is, $47.00.   The patient is insured through [employer]     Hannah [name], CPhT Pharmacy Patient Advocate Specialist [facility] Patient Advocate Team Direct Number: [phone]  Fax: [phone]

## 2020-12-30 NOTE — Progress Notes (Signed)
Physical Therapy Treatment Patient Details Name: Hannah [name] MRN: [id number] DOB: 1940-08-31 Today's Date: 12/30/2020   History of Present Illness Pt is a 80 y.o. female admitted 12/25/20 with chest pain. Pt with (+)COVID, elevated troponins with ?myocarditis or pericarditis associated with COVID. Plan for LHC/RHC on 12/27. PMH includes PEA arrest from large PE (2014) on Coumadin, DM2, HTN, HLD, large bowel stricture-s/p colectomy with ostomy in place.   PT Comments    Pt progressing with mobility. Today's session focused on therex for improving strength and activity tolerance. Continue to recommend HHPT services to maximize functional mobility and independence upon return home. Pt reports plan to likely stay at daughter's home a few days for initial increased assist.    Recommendations for follow up therapy are one component of a multi-disciplinary discharge planning process, led by the attending physician.  Recommendations may be updated based on patient status, additional functional criteria and insurance authorization.  Follow Up Recommendations  Home health PT     Assistance Recommended at Discharge Intermittent Supervision/Assistance  Equipment Recommendations  None recommended by PT    Recommendations for Other Services       Precautions / Restrictions Precautions Precautions: Fall;Other (comment) Precaution Comments: ostomy Restrictions Weight Bearing Restrictions: No     Mobility  Bed Mobility Overal bed mobility: Modified Independent Bed Mobility: Supine to Sit;Sit to Supine                Transfers Overall transfer level: Needs assistance Equipment used: Rolling walker (2 wheels) Transfers: Sit to/from Stand Sit to Stand: Min guard;Supervision;From elevated surface           General transfer comment: increased time and effort preparing to stand (repeatedly states, "this mattress is so soft"), cues for sequencing and eccentric control; pt performed 5x  sit<>stand from EOB (slightly elevated) to RW with initial supervision for safety, min guard for stability with increasing fatigue; heavy reliance on UE support to push into standing    Ambulation/Gait                   Stairs             Wheelchair Mobility    Modified Rankin (Stroke Patients Only)       Balance Overall balance assessment: Needs assistance   Sitting balance-Leahy Scale: Good Sitting balance - Comments: able to apply lotion to lower legs/feet without assist   Standing balance support: Reliant on assistive device for balance;Bilateral upper extremity supported;No upper extremity supported Standing balance-Leahy Scale: Fair                              Cognition Arousal/Alertness: Awake/alert Behavior During Therapy: WFL for tasks assessed/performed;Flat affect Overall Cognitive Status: No family/caregiver present to determine baseline cognitive functioning                                 General Comments: More appropriate conversation this session; apparent slowed processing, requires one-step commands. increased awareness of need to stay at daughter's home a couple days for increased assist        Exercises Other Exercises Other Exercises: 5x repeated sit<>stand (heavy reliance on BUE support with seated rest between trials); 30-sec bouts marching in place w/ RW    General Comments General comments (skin integrity, edema, etc.): pt likely planning to d/c to daughter's home for increased initial assist  Pertinent Vitals/Pain Pain Assessment: No/denies pain    Home Living                          Prior Function            PT Goals (current goals can now be found in the care plan section) Progress towards PT goals: Progressing toward goals    Frequency    Min 3X/week      PT Plan Current plan remains appropriate    Co-evaluation              AM-PAC PT "6 Clicks" Mobility    Outcome Measure  Help needed turning from your back to your side while in a flat bed without using bedrails?: None Help needed moving from lying on your back to sitting on the side of a flat bed without using bedrails?: A Little Help needed moving to and from a bed to a chair (including a wheelchair)?: A Little Help needed standing up from a chair using your arms (e.g., wheelchair or bedside chair)?: A Little Help needed to walk in hospital room?: A Little Help needed climbing 3-5 steps with a railing? : A Little 6 Click Score: 19    End of Session   Activity Tolerance: Patient tolerated treatment well Patient left: in bed;with call bell/phone within reach;with bed alarm set Nurse Communication: Mobility status PT Visit Diagnosis: Difficulty in walking, not elsewhere classified (R26.2);Muscle weakness (generalized) (M62.81)     Time: 8280-0349 PT Time Calculation (min) (ACUTE ONLY): 21 min  Charges:  $Therapeutic Exercise: 8-22 mins                     Ina Homes, PT, DPT Acute Rehabilitation Services  Pager [phone] Office [phone]  Malachy Chamber 12/30/2020, 12:45 PM

## 2020-12-31 ENCOUNTER — Encounter (HOSPITAL_COMMUNITY): Payer: Self-pay | Admitting: Cardiovascular Disease

## 2020-12-31 LAB — BASIC METABOLIC PANEL
Anion gap: 8 (ref 5–15)
BUN: 29 mg/dL — ABNORMAL HIGH (ref 8–23)
CO2: 20 mmol/L — ABNORMAL LOW (ref 22–32)
Calcium: 8.4 mg/dL — ABNORMAL LOW (ref 8.9–10.3)
Chloride: 109 mmol/L (ref 98–111)
Creatinine, Ser: 1.23 mg/dL — ABNORMAL HIGH (ref 0.44–1.00)
GFR, Estimated: 45 mL/min — ABNORMAL LOW (ref >60)
Glucose, Bld: 90 mg/dL (ref 70–99)
Potassium: 4.3 mmol/L (ref 3.5–5.1)
Sodium: 137 mmol/L (ref 135–145)

## 2020-12-31 LAB — CBC
HCT: 40 % (ref 36.0–46.0)
Hemoglobin: 12.6 g/dL (ref 12.0–15.0)
MCH: 24.4 pg — ABNORMAL LOW (ref 26.0–34.0)
MCHC: 31.5 g/dL (ref 30.0–36.0)
MCV: 77.4 fL — ABNORMAL LOW (ref 80.0–100.0)
Platelets: 220 10*3/uL (ref 150–400)
RBC: 5.17 MIL/uL — ABNORMAL HIGH (ref 3.87–5.11)
RDW: 20.5 % — ABNORMAL HIGH (ref 11.5–15.5)
WBC: 5.8 10*3/uL (ref 4.0–10.5)
nRBC: 0 % (ref 0.0–0.2)

## 2020-12-31 LAB — GLUCOSE, CAPILLARY
Glucose-Capillary: 75 mg/dL (ref 70–99)
Glucose-Capillary: 101 mg/dL — ABNORMAL HIGH (ref 70–99)
Glucose-Capillary: 92 mg/dL (ref 70–99)
Glucose-Capillary: 101 mg/dL — ABNORMAL HIGH (ref 70–99)
Glucose-Capillary: 85 mg/dL (ref 70–99)
Glucose-Capillary: 108 mg/dL — ABNORMAL HIGH (ref 70–99)

## 2020-12-31 LAB — PROTIME-INR
INR: 1.8 — ABNORMAL HIGH (ref 0.8–1.2)
INR: 1.8 — ABNORMAL HIGH (ref 0.8–1.2)
Prothrombin Time: 20.5 seconds — ABNORMAL HIGH (ref 11.4–15.2)
Prothrombin Time: 20.5 seconds — ABNORMAL HIGH (ref 11.4–15.2)

## 2020-12-31 LAB — CULTURE, BLOOD (ROUTINE X 2)
Culture: NO GROWTH
Culture: NO GROWTH

## 2020-12-31 MED FILL — Heparin Sodium (Porcine) Inj 1000 Unit/ML: INTRAMUSCULAR | Qty: 10 | Status: AC

## 2020-12-31 NOTE — Progress Notes (Signed)
PROGRESS NOTE        PATIENT DETAILS Name: Hannah [name] Age: 80 y.o. Sex: female Date of Birth: 1940/01/17 Admit Date: 12/25/2020 Admitting Physician Evalee Mutton Kristeen Mans, MD [id number], Steele Berg, MD  Brief Narrative: Patient is a 80 y.o. female with history of PEA arrest due to a large PE in 2014-on anticoagulation with Coumadin, DM-2, HTN, HLD, large bowel stricture-s/p colectomy with ostomy in place-presented to the hospital with chest pain.  See below for further details.  Subjective: No chest pain or shortness of breath.  Objective: Vitals: Blood pressure (!) 157/86, pulse 88, temperature 97.8 F (36.6 C), resp. rate 17, height 5\' 7"  (1.702 m), weight 76.4 kg, SpO2 96 %.   Exam: Gen Exam:Alert awake-not in any distress HEENT:atraumatic, normocephalic Chest: B/L clear to auscultation anteriorly CVS:S1S2 regular Abdomen:soft non tender, non distended Extremities:no edema Neurology: Non focal Skin: no rash   Pertinent Labs/Radiology: Recent Labs  Lab 12/27/20 0017 12/28/20 0145 12/28/20 2239 12/31/20 0540  WBC 7.6 6.7   < > 5.8  HGB 13.0 13.6   < > 12.6  PLT 204 231   < > 220  NA 134* 133*   < > 137  K 3.9 4.4   < > 4.3  CREATININE 1.31* 1.47*   < > 1.23*  AST 30 30  --   --   ALT 12 13  --   --   ALKPHOS 45 42  --   --   BILITOT 0.5 0.4  --   --    < > = values in this interval not displayed.    Assessment/Plan: Non-STEMI versus myocarditis: Presented with chest pain/troponin elevation-echo with suppressed EF and numerous wall motion abnormalities.  Given COVID-19 infection-myocarditis also in the differential.  Chest pain has resolved-being medically managed with IV heparin/aspirin/statin/beta-blocker-cardiology attempted LHC on 12/28 via radial artery-however due to access issues-not performed-plans are for RHC/LHC via femoral approach today.   COVID-19 infection: No respiratory symptoms-see concern for myocarditis-has completed a  course of Remdesivir.  CXR negative for PNA.  CT value 23 on COVID PCR.    Newly diagnosed systolic heart failure: Volume status stable-on metoprolol/Farxiga.  Cardiology planning to add other agents once LHC/RHC is completed.  PAF: Maintaining sinus rhythm-if goes back into RVR-plan is to start amiodarone.  On beta-blocker and full dose anticoagulation.  History of PEA arrest due to massive pulmonary embolism: Was on Coumadin-now on hold-on IV heparin   CKD stage IIIa: Creatinine stable-watch closely.  Lab Results  Component Value Date   CREATININE 1.23 (H) 12/31/2020   CREATININE 1.38 (H) 12/30/2020   CREATININE 1.47 (H) 12/29/2020     HTN: BP soft this morning-continue metoprolol-no longer on lisinopril.  HLD: Continue statin.  Hypothyroidism: Continue Synthroid  DM-2 (A1c 6.09 August 2020): CBG stable-continue SSI  Recent Labs    12/30/20 2115 12/31/20 0622 12/31/20 1139  GLUCAP 101* 92 101*     Chronic debility/deconditioning: At baseline-walks with the help of a walker-not very ambulatory.  PT/OT eval will be ordered.  BMI Estimated body mass index is 26.38 kg/m as calculated from the following:   Height as of this encounter: 5\' 7"  (1.702 m).   Weight as of this encounter: 76.4 kg.    Procedures: None Consults: Cardiology DVT Prophylaxis: Coumadin/heparin Code Status:Full code  Family Communication: Daughter-[phone]-updated over the phone on 12/28  Time spent: 25 minutes-Greater than 50% of this time was spent in counseling, explanation of diagnosis, planning of further management, and coordination of care.   Disposition Plan: Status is: Inpatient.  The patient will require care spanning > 2 midnights and should be moved to inpatient because: Chest pain-possible non-STEMI-COVID-febrile this morning-checking cultures-starting IV Remdesivir-not yet stable for discharge.   Diet: Diet Order             Diet NPO time specified Except for:  Sips with Meds  Diet effective 1000                     Antimicrobial agents: Anti-infectives (From admission, onward)    Start     Dose/Rate Route Frequency Ordered Stop   12/27/20 1000  remdesivir 100 mg in sodium chloride 0.9 % 100 mL IVPB       See Hyperspace for full Linked Orders Report.   100 mg 200 mL/hr over 30 Minutes Intravenous Daily 12/26/20 1500 12/30/20 1201   12/26/20 1600  remdesivir 200 mg in sodium chloride 0.9% 250 mL IVPB       See Hyperspace for full Linked Orders Report.   200 mg 580 mL/hr over 30 Minutes Intravenous Once 12/26/20 1500 12/27/20 2355        MEDICATIONS: Scheduled Meds:  vitamin C  500 mg Oral Daily   aspirin EC  81 mg Oral Daily   atorvastatin  40 mg Oral Daily   dapagliflozin propanediol  10 mg Oral Daily   insulin aspart  0-9 Units Subcutaneous TID WC   levothyroxine  25 mcg Oral QAC breakfast   metoprolol succinate  12.5 mg Oral Daily   pantoprazole  40 mg Oral Daily   sodium chloride flush  3 mL Intravenous Q12H   sodium chloride flush  3 mL Intravenous Q12H   sodium chloride flush  3 mL Intravenous Q12H   zinc sulfate  220 mg Oral Daily   Continuous Infusions:  sodium chloride     sodium chloride     sodium chloride     PRN Meds:.sodium chloride, sodium chloride, sodium chloride, acetaminophen, albuterol, guaiFENesin-dextromethorphan, ondansetron **OR** ondansetron (ZOFRAN) IV, sodium chloride flush, sodium chloride flush, sodium chloride flush   I have personally reviewed following labs and imaging studies  LABORATORY DATA: CBC: Recent Labs  Lab 12/25/20 1241 12/26/20 0741 12/27/20 0017 12/28/20 0145 12/29/20 0048 12/30/20 0121 12/30/20 1651 12/30/20 1655 12/31/20 0540  WBC 11.0*   < > 7.6 6.7 6.5 5.3  --   --  5.8  NEUTROABS 8.6*  --   --   --   --   --   --   --   --   HGB 13.6   < > 13.0 13.6 12.2 12.9 12.2 11.9* 12.6  HCT 44.6   < > 40.9 43.2 40.2 41.2 36.0 35.0* 40.0  MCV 81.4   < > 78.4* 78.1*  78.7* 79.1*  --   --  77.4*  PLT 294   < > 204 231 216 175  --   --  220   < > = values in this interval not displayed.     Basic Metabolic Panel: Recent Labs  Lab 12/27/20 0315 12/28/20 0145 12/28/20 2239 12/29/20 0048 12/30/20 0121 12/30/20 1651 12/30/20 1655 12/31/20 0540  NA  --  133* 135 133* 136 141 140 137  K  --  4.4 4.2 4.3 4.5 4.1 4.2 4.3  CL  --  105 107 108  110  --   --  109  CO2  --  18* 18* 16* 18*  --   --  20*  GLUCOSE  --  92 107* 99 76  --   --  90  BUN  --  30* 38* 39* 38*  --   --  29*  CREATININE  --  1.47* 1.46* 1.47* 1.38*  --   --  1.23*  CALCIUM  --  8.0* 7.9* 7.9* 7.8*  --   --  8.4*  MG 1.8  --  2.0  --   --   --   --   --      GFR: Estimated Creatinine Clearance: 39.5 mL/min (A) (by C-G formula based on SCr of 1.23 mg/dL (H)).  Liver Function Tests: Recent Labs  Lab 12/27/20 0017 12/28/20 0145  AST 30 30  ALT 12 13  ALKPHOS 45 42  BILITOT 0.5 0.4  PROT 5.8* 5.8*  ALBUMIN 2.7* 2.6*    No results for input(s): LIPASE, AMYLASE in the last 168 hours. No results for input(s): AMMONIA in the last 168 hours.  Coagulation Profile: Recent Labs  Lab 12/27/20 0017 12/28/20 0145 12/29/20 0048 12/30/20 0121 12/31/20 0540  INR 2.3* 1.8* 1.9* 1.8* 1.8*     Cardiac Enzymes: No results for input(s): CKTOTAL, CKMB, CKMBINDEX, TROPONINI in the last 168 hours.  BNP (last 3 results) No results for input(s): PROBNP in the last 8760 hours.  Lipid Profile: No results for input(s): CHOL, HDL, LDLCALC, TRIG, CHOLHDL, LDLDIRECT in the last 72 hours.  Thyroid Function Tests: No results for input(s): TSH, T4TOTAL, FREET4, T3FREE, THYROIDAB in the last 72 hours.  Anemia Panel: No results for input(s): VITAMINB12, FOLATE, FERRITIN, TIBC, IRON, RETICCTPCT in the last 72 hours.   Urine analysis:    Component Value Date/Time   COLORURINE YELLOW 12/26/2020 1104   APPEARANCEUR CLEAR 12/26/2020 1104   LABSPEC 1.010 12/26/2020 1104   PHURINE 8.5  (H) 12/26/2020 1104   GLUCOSEU NEGATIVE 12/26/2020 1104   HGBUR SMALL (A) 12/26/2020 1104   BILIRUBINUR NEGATIVE 12/26/2020 1104   KETONESUR NEGATIVE 12/26/2020 1104   PROTEINUR NEGATIVE 12/26/2020 1104   UROBILINOGEN 0.2 03/14/2017 1102   NITRITE POSITIVE (A) 12/26/2020 1104   LEUKOCYTESUR NEGATIVE 12/26/2020 1104    Sepsis Labs: Lactic Acid, Venous    Component Value Date/Time   LATICACIDVEN 0.86 12/15/2017 2122    MICROBIOLOGY: Recent Results (from the past 240 hour(s))  Resp Panel by RT-PCR (Flu A&B, Covid) Nasopharyngeal Swab     Status: Abnormal   Collection Time: 12/25/20 12:23 PM   Specimen: Nasopharyngeal Swab; Nasopharyngeal(NP) swabs in vial transport medium  Result Value Ref Range Status   SARS Coronavirus 2 by RT PCR POSITIVE (A) NEGATIVE Final    Comment: (NOTE) SARS-CoV-2 target nucleic acids are DETECTED.  The SARS-CoV-2 RNA is generally detectable in upper respiratory specimens during the acute phase of infection. Positive results are indicative of the presence of the identified virus, but do not rule out bacterial infection or co-infection with other pathogens not detected by the test. Clinical correlation with patient history and other diagnostic information is necessary to determine patient infection status. The expected result is Negative.  Fact Sheet for Patients: EntrepreneurPulse.com.au  Fact Sheet for Healthcare Providers: IncredibleEmployment.be  This test is not yet approved or cleared by the [place] FDA and  has been authorized for detection and/or diagnosis of SARS-CoV-2 by FDA under an Emergency Use Authorization (EUA).  This EUA will remain  in effect (meaning this test can be used) for the duration of  the COVID-19 declaration under Section 564(b)(1) of the A ct, 21 U.S.C. section 360bbb-3(b)(1), unless the authorization is terminated or revoked sooner.     Influenza A by PCR NEGATIVE NEGATIVE  Final   Influenza B by PCR NEGATIVE NEGATIVE Final    Comment: (NOTE) The Xpert Xpress SARS-CoV-2/FLU/RSV plus assay is intended as an aid in the diagnosis of influenza from Nasopharyngeal swab specimens and should not be used as a sole basis for treatment. Nasal washings and aspirates are unacceptable for Xpert Xpress SARS-CoV-2/FLU/RSV testing.  Fact Sheet for Patients: BloggerCourse.com  Fact Sheet for Healthcare Providers: SeriousBroker.it  This test is not yet approved or cleared by the [place] FDA and has been authorized for detection and/or diagnosis of SARS-CoV-2 by FDA under an Emergency Use Authorization (EUA). This EUA will remain in effect (meaning this test can be used) for the duration of the COVID-19 declaration under Section 564(b)(1) of the Act, 21 U.S.C. section 360bbb-3(b)(1), unless the authorization is terminated or revoked.  Performed at [facility], [address], [place], [place] [address]   MRSA Next Gen by PCR, Nasal     Status: None   Collection Time: 12/26/20 12:20 AM   Specimen: Nasal Mucosa; Nasal Swab  Result Value Ref Range Status   MRSA by PCR Next Gen NOT DETECTED NOT DETECTED Final    Comment: (NOTE) The GeneXpert MRSA Assay (FDA approved for NASAL specimens only), is one component of a comprehensive MRSA colonization surveillance program. It is not intended to diagnose MRSA infection nor to guide or monitor treatment for MRSA infections. Test performance is not FDA approved in patients less than 46 years old. Performed at [facility], [address] [place], [place] [address]   Culture, blood (routine x 2)     Status: None   Collection Time: 12/26/20  2:29 PM   Specimen: BLOOD LEFT HAND  Result Value Ref Range Status   Specimen Description BLOOD LEFT HAND  Final   Special Requests   Final    BOTTLES DRAWN AEROBIC AND ANAEROBIC Blood Culture results may not be  optimal due to an inadequate volume of blood received in culture bottles   Culture   Final    NO GROWTH 5 DAYS Performed at [facility], [address], [place], [place] [address]    Report Status 12/31/2020 FINAL  Final  Culture, blood (routine x 2)     Status: None   Collection Time: 12/26/20  2:40 PM   Specimen: BLOOD LEFT WRIST  Result Value Ref Range Status   Specimen Description BLOOD LEFT WRIST  Final   Special Requests   Final    BOTTLES DRAWN AEROBIC ONLY Blood Culture results may not be optimal due to an inadequate volume of blood received in culture bottles   Culture   Final    NO GROWTH 5 DAYS Performed at [facility], [address] [place], [place] [address]    Report Status 12/31/2020 FINAL  Final    RADIOLOGY STUDIES/RESULTS: CARDIAC CATHETERIZATION  Result Date: 12/30/2020 Normal right heart pressures Unsuccessful attempt at left heart cath from the right radial artery due to radial artery loop. Recommendations: Will plan to bring her back for left heart catheterization from the right femoral artery approach tomorrow if her INR is less than 1.7. Given her elevated INR today, I did not feel that it was safe  to proceed with femoral artery access today. Her right radial artery should not be used for access for future cardiac caths. NPO at midnight. Will not resume IV heparin tonight since we did obtain access into the right femoral vein today.     LOS: 4 days   Oren Binet, MD  [facility]    To contact the attending provider between 7A-7P or the covering provider during after hours 7P-7A, please log into the web site [email or web address] and access using universal [place] password for that web site. If you do not have the password, please call the hospital operator.  12/31/2020, 12:21 PM

## 2020-12-31 NOTE — Progress Notes (Signed)
Occupational Therapy Treatment Patient Details Name: Hannah [name] MRN: [id number] DOB: [birth year] Today's Date: 12/31/2020   History of present illness Pt is a 80 y.o. female admitted 12/25/20 with chest pain. Pt with (+)COVID, elevated troponins with ?myocarditis or pericarditis associated with COVID. Plan for LHC/RHC on 12/27. PMH includes PEA arrest from large PE (2014) on Coumadin, DM2, HTN, HLD, large bowel stricture-s/p colectomy with ostomy in place.   OT comments  Pt progressing well towards OT goals, able to demo insight into needs, safe DME use and sequencing of ADLs. Pt able to complete BSC transfer, toileting task, mobility in room with RW and standing ADLs with no more than Supervision. Pt does fatigue with standing tasks and able to implement energy conservation strategies as needed (uses Rollator at home for these strategies). Based on functional abilities today, update DC recs to [facility]. Will continue to follow acutely.   HR up to 133bpm with activity    Recommendations for follow up therapy are one component of a multi-disciplinary discharge planning process, led by the attending physician.  Recommendations may be updated based on patient status, additional functional criteria and insurance authorization.    Follow Up Recommendations  Home health OT    Assistance Recommended at Discharge Frequent or constant Supervision/Assistance  Equipment Recommendations  None recommended by OT    Recommendations for Other Services      Precautions / Restrictions Precautions Precautions: Fall;Other (comment) Precaution Comments: ostomy Restrictions Weight Bearing Restrictions: No       Mobility Bed Mobility Overal bed mobility: Modified Independent Bed Mobility: Supine to Sit;Sit to Supine           General bed mobility comments: increased effort and use of bedrail    Transfers Overall transfer level: Needs assistance Equipment used: Rolling walker (2  wheels) Transfers: Sit to/from Stand;Bed to chair/wheelchair/BSC Sit to Stand: Supervision   Step pivot transfers: Supervision             Balance Overall balance assessment: Needs assistance Sitting-balance support: No upper extremity supported;Feet supported Sitting balance-Leahy Scale: Good     Standing balance support: Reliant on assistive device for balance;Bilateral upper extremity supported;No upper extremity supported Standing balance-Leahy Scale: Fair Standing balance comment: can stand without support at sink for grooming tasks, use of BUE support for mobility                           ADL either performed or assessed with clinical judgement   ADL Overall ADL's : Needs assistance/impaired     Grooming: Supervision/safety;Standing;Sitting;Wash/dry hands;Brushing hair Grooming Details (indicate cue type and reason): able to reach outside BOS to reach soap, etc. Reports completing brushing hair seated on Rollator at home to conserve energy                 Toilet Transfer: Supervision/safety;Stand-pivot;Rolling walker (2 wheels);BSC/3in1 Toilet Transfer Details (indicate cue type and reason): good awareness of lines, DME sequencing Toileting- Clothing Manipulation and Hygiene: Set up;Sit to/from stand       Functional mobility during ADLs: Supervision/safety;Rolling walker (2 wheels) General ADL Comments: Improving mobility and insight for ADL completion with energy conservation implementation    Extremity/Trunk Assessment Upper Extremity Assessment Upper Extremity Assessment: Overall WFL for tasks assessed   Lower Extremity Assessment Lower Extremity Assessment: Defer to PT evaluation        Vision   Vision Assessment?: No apparent visual deficits   Perception     Praxis  Cognition Arousal/Alertness: Awake/alert Behavior During Therapy: WFL for tasks assessed/performed;Flat affect Overall Cognitive Status: No family/caregiver  present to determine baseline cognitive functioning                                 General Comments: Pt with appropriate answers and demo insight into DME needs (reported preference for [facility] use due to armrests to push from). slower processing though per RN, family not concerned about cognition and likely at baseline          Exercises     Shoulder Instructions       General Comments HR up to 133bpm, quickly resolved with seated rest break    Pertinent Vitals/ Pain       Pain Assessment: No/denies pain  Home Living                                          Prior Functioning/Environment              Frequency  Min 2X/week        Progress Toward Goals  OT Goals(current goals can now be found in the care plan section)  Progress towards OT goals: Progressing toward goals  Acute Rehab OT Goals Patient Stated Goal: get better soon OT Goal Formulation: With patient Time For Goal Achievement: 01/09/21 Potential to Achieve Goals: Fair ADL Goals Pt Will Perform Lower Body Bathing: with min guard assist;sitting/lateral leans;sit to/from stand Pt Will Perform Lower Body Dressing: with min guard assist;sitting/lateral leans;sit to/from stand Pt Will Transfer to Toilet: with min assist;ambulating Pt Will Perform Toileting - Clothing Manipulation and hygiene: with min guard assist;sitting/lateral leans;sit to/from stand Additional ADL Goal #1: Pt will complete bed mobility with min guard as a precursor to seated ADL's. Additional ADL Goal #2: Pt will sit EOB with no assist for 3 mins as a precursor to seated tasks.  Plan Discharge plan needs to be updated    Co-evaluation                 AM-PAC OT "6 Clicks" Daily Activity     Outcome Measure   Help from another person eating meals?: A Little Help from another person taking care of personal grooming?: A Little Help from another person toileting, which includes using toliet, bedpan,  or urinal?: A Little Help from another person bathing (including washing, rinsing, drying)?: A Little Help from another person to put on and taking off regular upper body clothing?: A Little Help from another person to put on and taking off regular lower body clothing?: A Little 6 Click Score: 18    End of Session Equipment Utilized During Treatment: Rolling walker (2 wheels)  OT Visit Diagnosis: Unsteadiness on feet (R26.81);Other abnormalities of gait and mobility (R26.89);Muscle weakness (generalized) (M62.81)   Activity Tolerance Patient tolerated treatment well   Patient Left in bed;with call bell/phone within reach;with bed alarm set   Nurse Communication Mobility status        Time: 1242-1310 OT Time Calculation (min): 28 min  Charges: OT General Charges $OT Visit: 1 Visit OT Treatments $Self Care/Home Management : 23-37 mins  Malachy Chamber, OTR/L Acute Rehab Services Office: [phone]   Layla Maw 12/31/2020, 1:43 PM

## 2020-12-31 NOTE — Progress Notes (Signed)
Progress Note  Patient Name: Hannah [name] Date of Encounter: 12/31/2020  [facility] Cardiologist: Dorris Carnes, MD   Subjective   Doing well this AM. Right radial access site c/d/I with no hematoma. Normal filling pressures. Unable to do LHC due to radial artery loop. Awaiting for INR to drop to <1.7 before proceeding with right groin access.   Inpatient Medications    Scheduled Meds:  vitamin C  500 mg Oral Daily   aspirin EC  81 mg Oral Daily   atorvastatin  40 mg Oral Daily   dapagliflozin propanediol  10 mg Oral Daily   insulin aspart  0-9 Units Subcutaneous TID WC   levothyroxine  25 mcg Oral QAC breakfast   metoprolol succinate  12.5 mg Oral Daily   pantoprazole  40 mg Oral Daily   sodium chloride flush  3 mL Intravenous Q12H   sodium chloride flush  3 mL Intravenous Q12H   sodium chloride flush  3 mL Intravenous Q12H   zinc sulfate  220 mg Oral Daily   Continuous Infusions:  sodium chloride     sodium chloride     sodium chloride     sodium chloride 50 mL/hr at 12/31/20 0605   PRN Meds: sodium chloride, sodium chloride, sodium chloride, acetaminophen, albuterol, guaiFENesin-dextromethorphan, ondansetron **OR** ondansetron (ZOFRAN) IV, sodium chloride flush, sodium chloride flush, sodium chloride flush   Vital Signs    Vitals:   12/30/20 1830 12/30/20 2005 12/30/20 2325 12/31/20 0353  BP: (!) 128/51 137/76 122/63 (!) 141/88  Pulse:  78 86 88  Resp:  20 17 18   Temp:  97.7 F (36.5 C) 98 F (36.7 C) 97.9 F (36.6 C)  TempSrc:  Oral Oral Oral  SpO2:  100% 97% 96%  Weight:      Height:        Intake/Output Summary (Last 24 hours) at 12/31/2020 0611 Last data filed at 12/31/2020 0357 Gross per 24 hour  Intake 361.5 ml  Output 600 ml  Net -238.5 ml    Last 3 Weights 12/25/2020 12/25/2020 08/19/2020  Weight (lbs) 168 lb 6.9 oz 175 lb 172 lb 6.4 oz  Weight (kg) 76.4 kg 79.379 kg 78.2 kg      Telemetry    NSR- Personally Reviewed  ECG    No  new tracing today - Personally Reviewed  Physical Exam   GEN: No acute distress.  Laying in bed Neck: No JVD Cardiac: RRR, 2/6 systolic murmur  Respiratory: Rhonchorous that improves with coughing GI: Soft, nontender, non-distended  MS: No edema; No deformity. Neuro:  Nonfocal  Psych: Normal affect   Labs    High Sensitivity Troponin:   Recent Labs  Lab 12/25/20 1241 12/25/20 1410 12/25/20 1700 12/25/20 1708  TROPONINIHS 8 77* 1,105* 1,222*      Chemistry Recent Labs  Lab 12/27/20 0017 12/27/20 0315 12/28/20 0145 12/28/20 2239 12/29/20 0048 12/30/20 0121 12/30/20 1651 12/30/20 1655  NA 134*  --  133* 135 133* 136 141 140  K 3.9  --  4.4 4.2 4.3 4.5 4.1 4.2  CL 105  --  105 107 108 110  --   --   CO2 20*  --  18* 18* 16* 18*  --   --   GLUCOSE 117*  --  92 107* 99 76  --   --   BUN 21  --  30* 38* 39* 38*  --   --   CREATININE 1.31*  --  1.47* 1.46* 1.47* 1.38*  --   --  CALCIUM 8.3*  --  8.0* 7.9* 7.9* 7.8*  --   --   MG  --  1.8  --  2.0  --   --   --   --   PROT 5.8*  --  5.8*  --   --   --   --   --   ALBUMIN 2.7*  --  2.6*  --   --   --   --   --   AST 30  --  30  --   --   --   --   --   ALT 12  --  13  --   --   --   --   --   ALKPHOS 45  --  42  --   --   --   --   --   BILITOT 0.5  --  0.4  --   --   --   --   --   GFRNONAA 41*  --  36* 36* 36* 39*  --   --   ANIONGAP 9  --  10 10 9 8   --   --      Lipids No results for input(s): CHOL, TRIG, HDL, LABVLDL, LDLCALC, CHOLHDL in the last 168 hours.  Hematology Recent Labs  Lab 12/29/20 0048 12/30/20 0121 12/30/20 1651 12/30/20 1655 12/31/20 0540  WBC 6.5 5.3  --   --  5.8  RBC 5.11 5.21*  --   --  5.17*  HGB 12.2 12.9 12.2 11.9* 12.6  HCT 40.2 41.2 36.0 35.0* 40.0  MCV 78.7* 79.1*  --   --  77.4*  MCH 23.9* 24.8*  --   --  24.4*  MCHC 30.3 31.3  --   --  31.5  RDW 20.4* 20.6*  --   --  20.5*  PLT 216 175  --   --  220    Thyroid No results for input(s): TSH, FREET4 in the last 168 hours.   BNP Recent Labs  Lab 12/25/20 1241  BNP 137.4*     DDimer  Recent Labs  Lab 12/25/20 1708  DDIMER 0.45      Radiology    CARDIAC CATHETERIZATION  Result Date: 12/30/2020 Normal right heart pressures Unsuccessful attempt at left heart cath from the right radial artery due to radial artery loop. Recommendations: Will plan to bring her back for left heart catheterization from the right femoral artery approach tomorrow if her INR is less than 1.7. Given her elevated INR today, I did not feel that it was safe to proceed with femoral artery access today. Her right radial artery should not be used for access for future cardiac caths. NPO at midnight. Will not resume IV heparin tonight since we did obtain access into the right femoral vein today.    Cardiac Studies   RHC 12/30/20: RAP 5 mPAP 53mmHg PCWP 39mmHg CO 5.47 CI 2.92  TTE 12/26/20: IMPRESSIONS   1. Left ventricular ejection fraction, by estimation, is 30 to 35%. The  left ventricle has moderately decreased function. The left ventricle  demonstrates regional wall motion abnormalities (see scoring  diagram/findings for description). There is mild  left ventricular hypertrophy. Left ventricular diastolic parameters are  consistent with Grade I diastolic dysfunction (impaired relaxation).  Elevated left ventricular end-diastolic pressure.   2. Right ventricular systolic function is normal. The right ventricular  size is normal. There is normal pulmonary artery systolic pressure. The  estimated right ventricular systolic pressure  is 27.2 mmHg.   3. A small pericardial effusion is present. The pericardial effusion is  anterior to the right ventricle.   4. The mitral valve is grossly normal. Mild mitral valve regurgitation.  Moderate mitral annular calcification.   5. Tricuspid valve regurgitation is moderate.   6. The aortic valve is tricuspid. There is mild calcification of the  aortic valve. Aortic valve regurgitation  is not visualized. Aortic valve  sclerosis/calcification is present, without any evidence of aortic  stenosis. Aortic valve mean gradient  measures 2.0 mmHg.   Comparison(s): Prior images reviewed side by side. LV dysfunction and wall  motion abnormalities are new in comparison.    LV Wall Scoring:  The mid and distal anterior septum, entire apex, and mid inferoseptal  segment  are akinetic. The mid anterolateral segment, mid anterior segment, and mid  inferior segment are hypokinetic. The posterior wall, basal anteroseptal  segment, basal anterolateral segment, basal anterior segment, basal  inferior  segment, and basal inferoseptal segment are normal.    Patient Profile     80 y.o. female with history of PEA arrest due to large PE in 2014 on chronic warfarin, HTN, HLD, DMII, large bowel stricture s/p colectomy with ostomy in place, and CKD IIIB who presented to the hospital with chest pain and elevated troponin for which Cardiology was consulted. Also noted to be COVID positive on admission. TTE with EF 30-35% with multiple WMA. Awaiting LHC  Assessment & Plan    #NSTEMI: #Chest Pain: Patient presented with chest pain with trop 1222.  TTE with LVEF 30-35% with regional WMA as detailed above and small pericardial effusion. Differential at this time remains broad and includes myopericarditis in the setting of COVID-19, ischemic etiology, vs stress cardiomyopathy. Plan for LHC via right groin access (has right radial loop) once INR <1.7. If cath clean, will need CMR. -Plan for LHC once INR <1.7 via right CFA access; unable to use right radial due to radial loop -If LHC without obstructive disease, will need CMR to assess for myopericarditis -Continue heparin gtt for [facility] -Continue ASA 81mg  daily -Contiue lipitor 40mg  daily -Continue metop 12.5mg  daily  #Newly Diagnosed HFrEF: TTE with LVEF 30-35% with multiple WMA as detailed above. Concern for ischemic vs myopericarditis vs stress  CM as detailed above. Clinically euvolemic on examination and RHC with normal filling pressures. BP soft limiting GDMT. -Plan for LHC via right groin access as above -Continue metop 12.5mg  daily -Will add entresto vs ARB/spiro pending renal function following cath -Continue farxiga 10mg  daily -Monitor I/Os and daily weights -Low Na diet  #Paroxysmal Afib/flutter: With episode of RVR on 12/26 now converted back to NSR/ST. On warfarin at home but heparin while awaiting LHC. -Continue heparin gtt for now; transition back to home warfarin following cath -Continue metop 12.5mg  daily  #COVID 19 Positive: Found on admission with mild symptoms -On remdesivir  #Prior DVT/History of PE: Occurred in 2014 c/b PEA arrest. On lifelong warfarin at home, which has been transitioned to heparin as above. -Continue heparin gtt for now; transition back to home warfarin following cath  #HLD: -Continue atorvastatin 40mg  daily  #CKD IIIB: Stable. -Renally dose medications -Judicious use of contrast     For questions or updates, please contact [facility] Please consult [email or web address] for contact info under        Signed, , MD  12/31/2020, 6:11 AM

## 2021-01-01 ENCOUNTER — Encounter (HOSPITAL_COMMUNITY)
Admission: EM | Disposition: A | Discharge status: Home Health | Payer: Self-pay | Source: Home / Self Care | Attending: Internal Medicine

## 2021-01-01 DIAGNOSIS — I38 Endocarditis, valve unspecified: Secondary | ICD-10-CM

## 2021-01-01 DIAGNOSIS — I5021 Acute systolic (congestive) heart failure: Secondary | ICD-10-CM

## 2021-01-01 DIAGNOSIS — R778 Other specified abnormalities of plasma proteins: Secondary | ICD-10-CM

## 2021-01-01 DIAGNOSIS — R7989 Other specified abnormal findings of blood chemistry: Secondary | ICD-10-CM

## 2021-01-01 HISTORY — PX: LEFT HEART CATH AND CORONARY ANGIOGRAPHY: CATH118249

## 2021-01-01 LAB — CBC
HCT: 39.1 % (ref 36.0–46.0)
Hemoglobin: 12.1 g/dL (ref 12.0–15.0)
MCH: 24.2 pg — ABNORMAL LOW (ref 26.0–34.0)
MCHC: 30.9 g/dL (ref 30.0–36.0)
MCV: 78 fL — ABNORMAL LOW (ref 80.0–100.0)
Platelets: 197 10*3/uL (ref 150–400)
RBC: 5.01 MIL/uL (ref 3.87–5.11)
RDW: 20.1 % — ABNORMAL HIGH (ref 11.5–15.5)
WBC: 5.1 10*3/uL (ref 4.0–10.5)
nRBC: 0 % (ref 0.0–0.2)

## 2021-01-01 LAB — BASIC METABOLIC PANEL
Anion gap: 9 (ref 5–15)
BUN: 22 mg/dL (ref 8–23)
CO2: 18 mmol/L — ABNORMAL LOW (ref 22–32)
Calcium: 8.2 mg/dL — ABNORMAL LOW (ref 8.9–10.3)
Chloride: 109 mmol/L (ref 98–111)
Creatinine, Ser: 1.23 mg/dL — ABNORMAL HIGH (ref 0.44–1.00)
Glucose, Bld: 100 mg/dL — ABNORMAL HIGH (ref 70–99)
Potassium: 3.9 mmol/L (ref 3.5–5.1)
Sodium: 136 mmol/L (ref 135–145)

## 2021-01-01 LAB — GLUCOSE, CAPILLARY
Glucose-Capillary: 112 mg/dL — ABNORMAL HIGH (ref 70–99)
Glucose-Capillary: 98 mg/dL (ref 70–99)
Glucose-Capillary: 77 mg/dL (ref 70–99)
Glucose-Capillary: 125 mg/dL — ABNORMAL HIGH (ref 70–99)

## 2021-01-01 LAB — PROTIME-INR
INR: 1.7 — ABNORMAL HIGH (ref 0.8–1.2)
Prothrombin Time: 20.2 seconds — ABNORMAL HIGH (ref 11.4–15.2)

## 2021-01-01 SURGERY — LEFT HEART CATH AND CORONARY ANGIOGRAPHY
Anesthesia: LOCAL

## 2021-01-01 MED ORDER — IOHEXOL 350 MG/ML SOLN
INTRAVENOUS | Status: DC | PRN
  Administered 2021-01-01: 15:00:00 40 mL via INTRA_ARTERIAL

## 2021-01-01 MED ORDER — FENTANYL CITRATE (PF) 100 MCG/2ML IJ SOLN
INTRAMUSCULAR | Status: DC | PRN
  Administered 2021-01-01: 50 ug via INTRAVENOUS

## 2021-01-01 MED ORDER — METOPROLOL TARTRATE 5 MG/5ML IV SOLN
INTRAVENOUS | Status: AC
  Filled 2021-01-01: qty 5

## 2021-01-01 MED ORDER — HEPARIN (PORCINE) IN NACL 1000-0.9 UT/500ML-% IV SOLN
INTRAVENOUS | Status: AC
  Filled 2021-01-01: qty 1000

## 2021-01-01 MED ORDER — HYDRALAZINE HCL 20 MG/ML IJ SOLN
INTRAMUSCULAR | Status: AC
  Filled 2021-01-01: qty 1

## 2021-01-01 MED ORDER — LABETALOL HCL 5 MG/ML IV SOLN: 10.0000 mg | INTRAVENOUS | Status: AC | PRN

## 2021-01-01 MED ORDER — METOPROLOL SUCCINATE ER 25 MG PO TB24
37.5000 mg | ORAL_TABLET | Freq: Every evening | ORAL | Status: DC
  Administered 2021-01-02 – 2021-01-03 (×2): 37.5 mg via ORAL
  Filled 2021-01-01 (×2): qty 2

## 2021-01-01 MED ORDER — HEPARIN (PORCINE) 25000 UT/250ML-% IV SOLN
600.0000 [IU]/h | INTRAVENOUS | Status: DC
  Administered 2021-01-01: 23:00:00 350 [IU]/h via INTRAVENOUS
  Filled 2021-01-01: qty 250

## 2021-01-01 MED ORDER — FENTANYL CITRATE (PF) 100 MCG/2ML IJ SOLN
INTRAMUSCULAR | Status: AC
  Filled 2021-01-01: qty 2

## 2021-01-01 MED ORDER — SODIUM CHLORIDE 0.9 % IV SOLN: 250.0000 mL | INTRAVENOUS | Status: DC | PRN

## 2021-01-01 MED ORDER — HYDRALAZINE HCL 20 MG/ML IJ SOLN
INTRAMUSCULAR | Status: DC | PRN
  Administered 2021-01-01: 5 mg via INTRAVENOUS

## 2021-01-01 MED ORDER — HEPARIN (PORCINE) IN NACL 1000-0.9 UT/500ML-% IV SOLN
INTRAVENOUS | Status: DC | PRN
  Administered 2021-01-01 (×2): 500 mL

## 2021-01-01 MED ORDER — SODIUM CHLORIDE 0.9% FLUSH
3.0000 mL | Freq: Two times a day (BID) | INTRAVENOUS | Status: DC
  Administered 2021-01-01 – 2021-01-03 (×4): 3 mL via INTRAVENOUS

## 2021-01-01 MED ORDER — HYDRALAZINE HCL 20 MG/ML IJ SOLN: 10.0000 mg | INTRAMUSCULAR | Status: AC | PRN

## 2021-01-01 MED ORDER — METOPROLOL TARTRATE 5 MG/5ML IV SOLN
INTRAVENOUS | Status: DC | PRN
  Administered 2021-01-01: 2.5 mg via INTRAVENOUS

## 2021-01-01 MED ORDER — METOPROLOL SUCCINATE ER 25 MG PO TB24: 37.5000 mg | ORAL_TABLET | Freq: Every day | ORAL | Status: DC

## 2021-01-01 MED ORDER — SODIUM CHLORIDE 0.9% FLUSH: 3.0000 mL | INTRAVENOUS | Status: DC | PRN

## 2021-01-01 MED ORDER — MIDAZOLAM HCL 2 MG/2ML IJ SOLN
INTRAMUSCULAR | Status: DC | PRN
  Administered 2021-01-01: 2 mg via INTRAVENOUS

## 2021-01-01 MED ORDER — LIDOCAINE HCL (PF) 1 % IJ SOLN
INTRAMUSCULAR | Status: DC | PRN
  Administered 2021-01-01: 10 mL

## 2021-01-01 MED ORDER — ASPIRIN 81 MG PO CHEW
81.0000 mg | CHEWABLE_TABLET | ORAL | Status: AC
  Administered 2021-01-01: 06:00:00 81 mg via ORAL
  Filled 2021-01-01: qty 1

## 2021-01-01 MED ORDER — LIDOCAINE HCL (PF) 1 % IJ SOLN
INTRAMUSCULAR | Status: AC
  Filled 2021-01-01: qty 30

## 2021-01-01 MED ORDER — WARFARIN SODIUM 5 MG PO TABS
5.0000 mg | ORAL_TABLET | Freq: Once | ORAL | Status: AC
  Administered 2021-01-01: 19:00:00 5 mg via ORAL
  Filled 2021-01-01: qty 1

## 2021-01-01 MED ORDER — SODIUM CHLORIDE 0.9 % IV SOLN: INTRAVENOUS | Status: AC

## 2021-01-01 MED ORDER — SODIUM CHLORIDE 0.9 % IV SOLN: INTRAVENOUS | Status: DC

## 2021-01-01 MED ORDER — WARFARIN - PHARMACIST DOSING INPATIENT: Freq: Every day | Status: DC

## 2021-01-01 MED ORDER — MIDAZOLAM HCL 2 MG/2ML IJ SOLN
INTRAMUSCULAR | Status: AC
  Filled 2021-01-01: qty 2

## 2021-01-01 SURGICAL SUPPLY — 11 items
CATH INFINITI 5FR MULTPACK ANG (CATHETERS) ×1 IMPLANT
CLOSURE MYNX CONTROL 5F (Vascular Products) ×1 IMPLANT
KIT HEART LEFT (KITS) ×3 IMPLANT
PACK CARDIAC CATHETERIZATION (CUSTOM PROCEDURE TRAY) ×3 IMPLANT
SHEATH PINNACLE 5F 10CM (SHEATH) ×1 IMPLANT
SHEATH PINNACLE 6F 10CM (SHEATH) IMPLANT
SHEATH PROBE COVER 6X72 (BAG) ×1 IMPLANT
TRANSDUCER W/STOPCOCK (MISCELLANEOUS) ×3 IMPLANT
TUBING CIL FLEX 10 FLL-RA (TUBING) ×3 IMPLANT
WIRE EMERALD 3MM-J .035X150CM (WIRE) ×1 IMPLANT
WIRE MICRO SET SILHO 5FR 7 (SHEATH) ×1 IMPLANT

## 2021-01-01 NOTE — H&P (View-Only) (Signed)
Progress Note  Patient Name: Hannah [name] Date of Encounter: 01/01/2021  [facility] Cardiologist: Dorris Carnes, MD    Subjective   No acute events overnight.  No dyspnea or angina.  Access sites stable.  INR 1.7 this AM.  Inpatient Medications    Scheduled Meds:  vitamin C  500 mg Oral Daily   aspirin  81 mg Oral Pre-Cath   aspirin EC  81 mg Oral Daily   atorvastatin  40 mg Oral Daily   dapagliflozin propanediol  10 mg Oral Daily   insulin aspart  0-9 Units Subcutaneous TID WC   levothyroxine  25 mcg Oral QAC breakfast   metoprolol succinate  12.5 mg Oral Daily   pantoprazole  40 mg Oral Daily   sodium chloride flush  3 mL Intravenous Q12H   sodium chloride flush  3 mL Intravenous Q12H   sodium chloride flush  3 mL Intravenous Q12H   zinc sulfate  220 mg Oral Daily   Continuous Infusions:  sodium chloride     sodium chloride     sodium chloride     sodium chloride     PRN Meds: sodium chloride, sodium chloride, sodium chloride, acetaminophen, albuterol, guaiFENesin-dextromethorphan, ondansetron **OR** ondansetron (ZOFRAN) IV, sodium chloride flush, sodium chloride flush, sodium chloride flush   Vital Signs    Vitals:   12/31/20 2300 12/31/20 2323 01/01/21 0300 01/01/21 0327  BP:  (!) 146/83  (!) 153/79  Pulse:  70  78  Resp: 15 16 16 16   Temp:  97.7 F (36.5 C)  97.7 F (36.5 C)  TempSrc:  Oral  Oral  SpO2:  99%  97%  Weight:      Height:        Intake/Output Summary (Last 24 hours) at 01/01/2021 0615 Last data filed at 01/01/2021 0328 Gross per 24 hour  Intake --  Output 1150 ml  Net -1150 ml   Last 3 Weights 12/31/2020 12/25/2020 12/25/2020  Weight (lbs) 168 lb 6.9 oz 168 lb 6.9 oz 175 lb  Weight (kg) 76.4 kg 76.4 kg 79.379 kg      Telemetry    Sinus rhythm- Personally Reviewed  ECG    Not done today  Physical Exam   GEN: No acute distress.   Neck: No JVD Cardiac: RRR, no murmurs, rubs, or gallops.  Respiratory: Clear to  auscultation bilaterally. GI: Soft, nontender, non-distended  MS: No edema; No deformity. Neuro:  Nonfocal  Psych: Normal affect   Labs    High Sensitivity Troponin:   Recent Labs  Lab 12/25/20 1241 12/25/20 1410 12/25/20 1700 12/25/20 1708  TROPONINIHS 8 77* 1,105* 1,222*     Chemistry Recent Labs  Lab 12/27/20 0017 12/27/20 0315 12/28/20 0145 12/28/20 2239 12/29/20 0048 12/30/20 0121 12/30/20 1651 12/30/20 1655 12/31/20 0540 01/01/21 0114  NA 134*  --  133* 135   < > 136   < > 140 137 136  K 3.9  --  4.4 4.2   < > 4.5   < > 4.2 4.3 3.9  CL 105  --  105 107   < > 110  --   --  109 109  CO2 20*  --  18* 18*   < > 18*  --   --  20* 18*  GLUCOSE 117*  --  92 107*   < > 76  --   --  90 100*  BUN 21  --  30* 38*   < > 38*  --   --  29* 22  CREATININE 1.31*  --  1.47* 1.46*   < > 1.38*  --   --  1.23* 1.23*  CALCIUM 8.3*  --  8.0* 7.9*   < > 7.8*  --   --  8.4* 8.2*  MG  --  1.8  --  2.0  --   --   --   --   --   --   PROT 5.8*  --  5.8*  --   --   --   --   --   --   --   ALBUMIN 2.7*  --  2.6*  --   --   --   --   --   --   --   AST 30  --  30  --   --   --   --   --   --   --   ALT 12  --  13  --   --   --   --   --   --   --   ALKPHOS 45  --  42  --   --   --   --   --   --   --   BILITOT 0.5  --  0.4  --   --   --   --   --   --   --   GFRNONAA 41*  --  36* 36*   < > 39*  --   --  45* NOT CALCULATED  ANIONGAP 9  --  10 10   < > 8  --   --  8 9   < > = values in this interval not displayed.    Lipids No results for input(s): CHOL, TRIG, HDL, LABVLDL, LDLCALC, CHOLHDL in the last 168 hours.  Hematology Recent Labs  Lab 12/30/20 0121 12/30/20 1651 12/30/20 1655 12/31/20 0540 01/01/21 0114  WBC 5.3  --   --  5.8 5.1  RBC 5.21*  --   --  5.17* 5.01  HGB 12.9   < > 11.9* 12.6 12.1  HCT 41.2   < > 35.0* 40.0 39.1  MCV 79.1*  --   --  77.4* 78.0*  MCH 24.8*  --   --  24.4* 24.2*  MCHC 31.3  --   --  31.5 30.9  RDW 20.6*  --   --  20.5* 20.1*  PLT 175  --   --   220 197   < > = values in this interval not displayed.   Thyroid No results for input(s): TSH, FREET4 in the last 168 hours.  BNP Recent Labs  Lab 12/25/20 1241  BNP 137.4*    DDimer  Recent Labs  Lab 12/25/20 1708  DDIMER 0.45     Radiology    CARDIAC CATHETERIZATION  Result Date: 12/30/2020 Normal right heart pressures Unsuccessful attempt at left heart cath from the right radial artery due to radial artery loop. Recommendations: Will plan to bring her back for left heart catheterization from the right femoral artery approach tomorrow if her INR is less than 1.7. Given her elevated INR today, I did not feel that it was safe to proceed with femoral artery access today. Her right radial artery should not be used for access for future cardiac caths. NPO at midnight. Will not resume IV heparin tonight since we did obtain access into the right femoral vein today.    Cardiac Studies   RHC 12/30/20: RAP  5 mPAP PCWP CO 5.47 CI 2.92   TTE 12/26/20: IMPRESSIONS   1. Left ventricular ejection fraction, by estimation, is 30 to 35%. The  left ventricle has moderately decreased function. The left ventricle  demonstrates regional wall motion abnormalities (see scoring  diagram/findings for description). There is mild  left ventricular hypertrophy. Left ventricular diastolic parameters are  consistent with Grade I diastolic dysfunction (impaired relaxation).  Elevated left ventricular end-diastolic pressure.   2. Right ventricular systolic function is normal. The right ventricular  size is normal. There is normal pulmonary artery systolic pressure. The  estimated right ventricular systolic pressure is 27.2 mmHg.   3. A small pericardial effusion is present. The pericardial effusion is  anterior to the right ventricle.   4. The mitral valve is grossly normal. Mild mitral valve regurgitation.  Moderate mitral annular calcification.   5. Tricuspid valve regurgitation is  moderate.   6. The aortic valve is tricuspid. There is mild calcification of the  aortic valve. Aortic valve regurgitation is not visualized. Aortic valve  sclerosis/calcification is present, without any evidence of aortic  stenosis. Aortic valve mean gradient  measures 2.0 mmHg.   Comparison(s): Prior images reviewed side by side. LV dysfunction and wall  motion abnormalities are new in comparison.     LV Wall Scoring:  The mid and distal anterior septum, entire apex, and mid inferoseptal  segment  are akinetic. The mid anterolateral segment, mid anterior segment, and mid  inferior segment are hypokinetic. The posterior wall, basal anteroseptal  segment, basal anterolateral segment, basal anterior segment, basal  inferior  segment, and basal inferoseptal segment are normal.   Patient Profile     80 y.o. female with history of PEA arrest due to large PE in 2014 on chronic warfarin, HTN, HLD, DMII, large bowel stricture s/p colectomy with ostomy in place, and CKD IIIB who presented to the hospital with chest pain and elevated troponin for which Cardiology was consulted. Also noted to be COVID positive on admission. TTE with EF 30-35% with multiple WMA. Awaiting coronary angiography due to NSTEMI and new cardiomyopathy  Assessment & Plan    #NSTEMI: #Chest Pain: Patient presented with chest pain with trop 1222.  TTE with LVEF 30-35% with regional WMA as detailed above and small pericardial effusion. Differential at this time remains broad and includes myopericarditis in the setting of COVID-19, ischemic etiology, vs stress cardiomyopathy. Plan for LHC via right groin access (has right radial loop) once INR <1.7. If cath clean, will need CMR. -Plan for LHC once INR <1.7 via right CFA access; unable to use right radial due to radial loop -If LHC without obstructive disease, will need CMR to assess for myopericarditis -Continue heparin gtt for [facility] -Continue ASA 81mg  daily -Contiue lipitor  40mg  daily -Increase Toprol to 25mg  qday   #Newly Diagnosed HFrEF: TTE with LVEF 30-35% with multiple WMA as detailed above. Concern for ischemic vs myopericarditis vs stress CM as detailed above. Clinically euvolemic on examination and RHC with normal filling pressures. BP soft limiting GDMT. -Plan for LHC via right groin access as above -Increase Toprol as above. -Will add entresto vs ARB/spiro pending renal function following cath -Continue farxiga 10mg  daily -Monitor I/Os and daily weights -Low Na diet -Net negative overnight   #Paroxysmal Afib/flutter: With episode of RVR on 12/26 now converted back to NSR/ST. On warfarin at home but heparin while awaiting LHC. -Continue heparin gtt for now; transition back to home warfarin following cath -Continue Toprol   #  COVID 19 Positive: Found on admission with mild symptoms -On remdesivir   #Prior DVT/History of PE: Occurred in 2014 c/b PEA arrest. On lifelong warfarin at home, which has been transitioned to heparin as above. -Continue heparin gtt for now; transition back to home warfarin following cath   #HLD: -Continue atorvastatin 40mg  daily   #CKD IIIB: Stable. -Renally dose medications -Judicious use of contrast      For questions or updates, please contact [place] Please consult [email or web address] for contact info under        Signed, Early Osmond, MD  01/01/2021, 6:15 AM

## 2021-01-01 NOTE — Interval H&P Note (Signed)
History and Physical Interval Note:  01/01/2021 2:07 PM  Hannah [name]  has presented today for surgery, with the diagnosis of chest pain.  The various methods of treatment have been discussed with the patient and family. After consideration of risks, benefits and other options for treatment, the patient has consented to  Procedure(s): LEFT HEART CATH AND CORONARY ANGIOGRAPHY (N/A) as a surgical intervention.  The patient's history has been reviewed, patient examined, no change in status, stable for surgery.  I have reviewed the patient's chart and labs.  Questions were answered to the patient's satisfaction.    Cath Lab Visit (complete for each Cath Lab visit)  Clinical Evaluation Leading to the Procedure:   ACS: Yes.    Non-ACS:    Anginal Classification: CCS III  Anti-ischemic medical therapy: Minimal Therapy (1 class of medications)  Non-Invasive Test Results: No non-invasive testing performed  Prior CABG: No previous CABG        Verne Carrow

## 2021-01-01 NOTE — Progress Notes (Addendum)
Progress Note  Patient Name: Hannah [name] Date of Encounter: 01/01/2021  [facility] Cardiologist: Dorris Carnes, MD    Subjective   No acute events overnight.  No dyspnea or angina.  Access sites stable.  INR 1.7 this AM.  Inpatient Medications    Scheduled Meds:  vitamin C  500 mg Oral Daily   aspirin  81 mg Oral Pre-Cath   aspirin EC  81 mg Oral Daily   atorvastatin  40 mg Oral Daily   dapagliflozin propanediol  10 mg Oral Daily   insulin aspart  0-9 Units Subcutaneous TID WC   levothyroxine  25 mcg Oral QAC breakfast   metoprolol succinate  12.5 mg Oral Daily   pantoprazole  40 mg Oral Daily   sodium chloride flush  3 mL Intravenous Q12H   sodium chloride flush  3 mL Intravenous Q12H   sodium chloride flush  3 mL Intravenous Q12H   zinc sulfate  220 mg Oral Daily   Continuous Infusions:  sodium chloride     sodium chloride     sodium chloride     sodium chloride     PRN Meds: sodium chloride, sodium chloride, sodium chloride, acetaminophen, albuterol, guaiFENesin-dextromethorphan, ondansetron **OR** ondansetron (ZOFRAN) IV, sodium chloride flush, sodium chloride flush, sodium chloride flush   Vital Signs    Vitals:   12/31/20 2300 12/31/20 2323 01/01/21 0300 01/01/21 0327  BP:  (!) 146/83  (!) 153/79  Pulse:  70  78  Resp: 15 16 16 16   Temp:  97.7 F (36.5 C)  97.7 F (36.5 C)  TempSrc:  Oral  Oral  SpO2:  99%  97%  Weight:      Height:        Intake/Output Summary (Last 24 hours) at 01/01/2021 0615 Last data filed at 01/01/2021 0328 Gross per 24 hour  Intake --  Output 1150 ml  Net -1150 ml   Last 3 Weights 12/31/2020 12/25/2020 12/25/2020  Weight (lbs) 168 lb 6.9 oz 168 lb 6.9 oz 175 lb  Weight (kg) 76.4 kg 76.4 kg 79.379 kg      Telemetry    Sinus rhythm- Personally Reviewed  ECG    Not done today  Physical Exam   GEN: No acute distress.   Neck: No JVD Cardiac: RRR, no murmurs, rubs, or gallops.  Respiratory: Clear to  auscultation bilaterally. GI: Soft, nontender, non-distended  MS: No edema; No deformity. Neuro:  Nonfocal  Psych: Normal affect   Labs    High Sensitivity Troponin:   Recent Labs  Lab 12/25/20 1241 12/25/20 1410 12/25/20 1700 12/25/20 1708  TROPONINIHS 8 77* 1,105* 1,222*     Chemistry Recent Labs  Lab 12/27/20 0017 12/27/20 0315 12/28/20 0145 12/28/20 2239 12/29/20 0048 12/30/20 0121 12/30/20 1651 12/30/20 1655 12/31/20 0540 01/01/21 0114  NA 134*  --  133* 135   < > 136   < > 140 137 136  K 3.9  --  4.4 4.2   < > 4.5   < > 4.2 4.3 3.9  CL 105  --  105 107   < > 110  --   --  109 109  CO2 20*  --  18* 18*   < > 18*  --   --  20* 18*  GLUCOSE 117*  --  92 107*   < > 76  --   --  90 100*  BUN 21  --  30* 38*   < > 38*  --   --  29* 22  CREATININE 1.31*  --  1.47* 1.46*   < > 1.38*  --   --  1.23* 1.23*  CALCIUM 8.3*  --  8.0* 7.9*   < > 7.8*  --   --  8.4* 8.2*  MG  --  1.8  --  2.0  --   --   --   --   --   --   PROT 5.8*  --  5.8*  --   --   --   --   --   --   --   ALBUMIN 2.7*  --  2.6*  --   --   --   --   --   --   --   AST 30  --  30  --   --   --   --   --   --   --   ALT 12  --  13  --   --   --   --   --   --   --   ALKPHOS 45  --  42  --   --   --   --   --   --   --   BILITOT 0.5  --  0.4  --   --   --   --   --   --   --   GFRNONAA 41*  --  36* 36*   < > 39*  --   --  45* NOT CALCULATED  ANIONGAP 9  --  10 10   < > 8  --   --  8 9   < > = values in this interval not displayed.    Lipids No results for input(s): CHOL, TRIG, HDL, LABVLDL, LDLCALC, CHOLHDL in the last 168 hours.  Hematology Recent Labs  Lab 12/30/20 0121 12/30/20 1651 12/30/20 1655 12/31/20 0540 01/01/21 0114  WBC 5.3  --   --  5.8 5.1  RBC 5.21*  --   --  5.17* 5.01  HGB 12.9   < > 11.9* 12.6 12.1  HCT 41.2   < > 35.0* 40.0 39.1  MCV 79.1*  --   --  77.4* 78.0*  MCH 24.8*  --   --  24.4* 24.2*  MCHC 31.3  --   --  31.5 30.9  RDW 20.6*  --   --  20.5* 20.1*  PLT 175  --   --   220 197   < > = values in this interval not displayed.   Thyroid No results for input(s): TSH, FREET4 in the last 168 hours.  BNP Recent Labs  Lab 12/25/20 1241  BNP 137.4*    DDimer  Recent Labs  Lab 12/25/20 1708  DDIMER 0.45     Radiology    CARDIAC CATHETERIZATION  Result Date: 12/30/2020 Normal right heart pressures Unsuccessful attempt at left heart cath from the right radial artery due to radial artery loop. Recommendations: Will plan to bring her back for left heart catheterization from the right femoral artery approach tomorrow if her INR is less than 1.7. Given her elevated INR today, I did not feel that it was safe to proceed with femoral artery access today. Her right radial artery should not be used for access for future cardiac caths. NPO at midnight. Will not resume IV heparin tonight since we did obtain access into the right femoral vein today.    Cardiac Studies   RHC 12/30/20: RAP  5 mPAP PCWP CO 5.47 CI 2.92   TTE 12/26/20: IMPRESSIONS   1. Left ventricular ejection fraction, by estimation, is 30 to 35%. The  left ventricle has moderately decreased function. The left ventricle  demonstrates regional wall motion abnormalities (see scoring  diagram/findings for description). There is mild  left ventricular hypertrophy. Left ventricular diastolic parameters are  consistent with Grade I diastolic dysfunction (impaired relaxation).  Elevated left ventricular end-diastolic pressure.   2. Right ventricular systolic function is normal. The right ventricular  size is normal. There is normal pulmonary artery systolic pressure. The  estimated right ventricular systolic pressure is 27.2 mmHg.   3. A small pericardial effusion is present. The pericardial effusion is  anterior to the right ventricle.   4. The mitral valve is grossly normal. Mild mitral valve regurgitation.  Moderate mitral annular calcification.   5. Tricuspid valve regurgitation is  moderate.   6. The aortic valve is tricuspid. There is mild calcification of the  aortic valve. Aortic valve regurgitation is not visualized. Aortic valve  sclerosis/calcification is present, without any evidence of aortic  stenosis. Aortic valve mean gradient  measures 2.0 mmHg.   Comparison(s): Prior images reviewed side by side. LV dysfunction and wall  motion abnormalities are new in comparison.     LV Wall Scoring:  The mid and distal anterior septum, entire apex, and mid inferoseptal  segment  are akinetic. The mid anterolateral segment, mid anterior segment, and mid  inferior segment are hypokinetic. The posterior wall, basal anteroseptal  segment, basal anterolateral segment, basal anterior segment, basal  inferior  segment, and basal inferoseptal segment are normal.   Patient Profile     80 y.o. female with history of PEA arrest due to large PE in 2014 on chronic warfarin, HTN, HLD, DMII, large bowel stricture s/p colectomy with ostomy in place, and CKD IIIB who presented to the hospital with chest pain and elevated troponin for which Cardiology was consulted. Also noted to be COVID positive on admission. TTE with EF 30-35% with multiple WMA. Awaiting coronary angiography due to NSTEMI and new cardiomyopathy  Assessment & Plan    #NSTEMI: #Chest Pain: Patient presented with chest pain with trop 1222.  TTE with LVEF 30-35% with regional WMA as detailed above and small pericardial effusion. Differential at this time remains broad and includes myopericarditis in the setting of COVID-19, ischemic etiology, vs stress cardiomyopathy. Plan for LHC via right groin access (has right radial loop) once INR <1.7. If cath clean, will need CMR. -Plan for LHC once INR <1.7 via right CFA access; unable to use right radial due to radial loop -If LHC without obstructive disease, will need CMR to assess for myopericarditis -Continue heparin gtt for [facility] -Continue ASA 81mg  daily -Contiue lipitor  40mg  daily -Increase Toprol to 25mg  qday   #Newly Diagnosed HFrEF: TTE with LVEF 30-35% with multiple WMA as detailed above. Concern for ischemic vs myopericarditis vs stress CM as detailed above. Clinically euvolemic on examination and RHC with normal filling pressures. BP soft limiting GDMT. -Plan for LHC via right groin access as above -Increase Toprol as above. -Will add entresto vs ARB/spiro pending renal function following cath -Continue farxiga 10mg  daily -Monitor I/Os and daily weights -Low Na diet -Net negative overnight   #Paroxysmal Afib/flutter: With episode of RVR on 12/26 now converted back to NSR/ST. On warfarin at home but heparin while awaiting LHC. -Continue heparin gtt for now; transition back to home warfarin following cath -Continue Toprol   #  COVID 19 Positive: Found on admission with mild symptoms -On remdesivir   #Prior DVT/History of PE: Occurred in 2014 c/b PEA arrest. On lifelong warfarin at home, which has been transitioned to heparin as above. -Continue heparin gtt for now; transition back to home warfarin following cath   #HLD: -Continue atorvastatin 40mg  daily   #CKD IIIB: Stable. -Renally dose medications -Judicious use of contrast      For questions or updates, please contact [place] Please consult [email or web address] for contact info under        Signed, Early Osmond, MD  01/01/2021, 6:15 AM    ADDENDUM:  Cath with mild CAD, will increase Toprol to 37.5 and depending on Cr, will plan on losartan and spironlactone for NICM.  Will need CMR in future.

## 2021-01-01 NOTE — Progress Notes (Signed)
PT Cancellation Note  Patient Details Name: Hannah [name] MRN: [id number] DOB: 1940/11/15   Cancelled Treatment:    Reason Eval/Treat Not Completed: Patient at procedure or test/unavailable;Active bedrest order. Pt in cath lab this afternoon and now is on bedrest until 1800 12/30. PT will continue to follow and progress treatment as able.   Vickki Muff, PT, DPT   Acute Rehabilitation Department Pager #: [phone]   Ronnie Derby 01/01/2021, 4:54 PM

## 2021-01-01 NOTE — Progress Notes (Signed)
PROGRESS NOTE        PATIENT DETAILS Name: Hannah [name] Age: 80 y.o. Sex: female Date of Birth: January 18, 1940 Admit Date: 12/25/2020 Admitting Physician Dewayne Shorter Levora Dredge, MD [id number], [place] Lore, MD  Brief Narrative: Patient is a 80 y.o. female with history of PEA arrest due to a large PE in 2014-on anticoagulation with Coumadin, DM-2, HTN, HLD, large bowel stricture-s/p colectomy with ostomy in place-presented to the hospital with chest pain.  See below for further details.  Subjective: Lying comfortably in bed-no chest pain or shortness of breath.  Objective: Vitals: Blood pressure (!) 156/76, pulse 78, temperature 97.9 F (36.6 C), temperature source Oral, resp. rate 19, height 5\' 7"  (1.702 m), weight 76.4 kg, SpO2 97 %.   Exam: Gen Exam:Alert awake-not in any distress HEENT:atraumatic, normocephalic Chest: B/L clear to auscultation anteriorly CVS:S1S2 regular Abdomen:soft non tender, non distended Extremities:no edema Neurology: Non focal Skin: no rash   Pertinent Labs/Radiology: Recent Labs  Lab 12/27/20 0017 12/28/20 0145 12/28/20 2239 01/01/21 0114  WBC 7.6 6.7   < > 5.1  HGB 13.0 13.6   < > 12.1  PLT 204 231   < > 197  NA 134* 133*   < > 136  K 3.9 4.4   < > 3.9  CREATININE 1.31* 1.47*   < > 1.23*  AST 30 30  --   --   ALT 12 13  --   --   ALKPHOS 45 42  --   --   BILITOT 0.5 0.4  --   --    < > = values in this interval not displayed.    Assessment/Plan: Non-STEMI versus myocarditis: Presented with chest pain/troponin elevation-echo with suppressed EF and numerous wall motion abnormalities.  Given COVID-19 infection-myopericarditis also in the differential.  Chest pain has resolved-being medically managed with IV heparin/aspirin/statin/beta-blocker-for LHC via femoral approach today.  Note-cardiology attempted LHC on 12/28 via radial approach-however due to access issues-this was unsuccessful   COVID-19 infection: No  respiratory symptoms-see concern for myocarditis-has completed a course of Remdesivir.  CXR negative for PNA.  CT value 23 on COVID PCR.    Newly diagnosed systolic heart failure: Volume status stable-on metoprolol/Farxiga.  Cardiology planning to add other agents once LHC/RHC is completed.  PAF: Maintaining sinus rhythm-if goes back into RVR-plan is to start amiodarone.  On beta-blocker and full dose anticoagulation.  History of PEA arrest due to massive pulmonary embolism: Was on Coumadin-now on hold-on IV heparin   CKD stage IIIa: Creatinine stable-watch closely.  Lab Results  Component Value Date   CREATININE 1.23 (H) 01/01/2021   CREATININE 1.23 (H) 12/31/2020   CREATININE 1.38 (H) 12/30/2020      HTN: BP soft this morning-continue metoprolol-no longer on lisinopril.  HLD: Continue statin.  Hypothyroidism: Continue Synthroid  DM-2 (A1c 6.09 August 2020): CBG stable-continue SSI  Recent Labs    12/31/20 1657 12/31/20 2152 01/01/21 0639  GLUCAP 85 108* 112*     Chronic debility/deconditioning: At baseline-walks with the help of a walker-not very ambulatory.  PT/OT eval will be ordered.  BMI Estimated body mass index is 26.38 kg/m as calculated from the following:   Height as of this encounter: 5\' 7"  (1.702 m).   Weight as of this encounter: 76.4 kg.    Procedures: None Consults: Cardiology DVT Prophylaxis: Coumadin/heparin Code Status:Full code  Family  Communication: Daughter-Michele-[phone]-updated over the phone on 12/28  Time spent: 25 minutes-Greater than 50% of this time was spent in counseling, explanation of diagnosis, planning of further management, and coordination of care.   Disposition Plan: Status is: Inpatient.  The patient will require care spanning > 2 midnights and should be moved to inpatient because: Chest pain-possible non-STEMI-COVID-febrile this morning-checking cultures-starting IV Remdesivir-not yet stable for  discharge.   Diet: Diet Order             Diet NPO time specified Except for: Sips with Meds  Diet effective now                     Antimicrobial agents: Anti-infectives (From admission, onward)    Start     Dose/Rate Route Frequency Ordered Stop   12/27/20 1000  remdesivir 100 mg in sodium chloride 0.9 % 100 mL IVPB       See Hyperspace for full Linked Orders Report.   100 mg 200 mL/hr over 30 Minutes Intravenous Daily 12/26/20 1500 12/30/20 1201   12/26/20 1600  remdesivir 200 mg in sodium chloride 0.9% 250 mL IVPB       See Hyperspace for full Linked Orders Report.   200 mg 580 mL/hr over 30 Minutes Intravenous Once 12/26/20 1500 12/27/20 2355        MEDICATIONS: Scheduled Meds:  vitamin C  500 mg Oral Daily   aspirin EC  81 mg Oral Daily   atorvastatin  40 mg Oral Daily   dapagliflozin propanediol  10 mg Oral Daily   insulin aspart  0-9 Units Subcutaneous TID WC   levothyroxine  25 mcg Oral QAC breakfast   metoprolol succinate  12.5 mg Oral Daily   pantoprazole  40 mg Oral Daily   sodium chloride flush  3 mL Intravenous Q12H   sodium chloride flush  3 mL Intravenous Q12H   sodium chloride flush  3 mL Intravenous Q12H   zinc sulfate  220 mg Oral Daily   Continuous Infusions:  sodium chloride     sodium chloride     sodium chloride     sodium chloride 50 mL/hr at 01/01/21 0615   PRN Meds:.sodium chloride, sodium chloride, sodium chloride, acetaminophen, albuterol, guaiFENesin-dextromethorphan, ondansetron **OR** ondansetron (ZOFRAN) IV, sodium chloride flush, sodium chloride flush, sodium chloride flush   I have personally reviewed following labs and imaging studies  LABORATORY DATA: CBC: Recent Labs  Lab 12/25/20 1241 12/26/20 0741 12/28/20 0145 12/29/20 0048 12/30/20 0121 12/30/20 1651 12/30/20 1655 12/31/20 0540 01/01/21 0114  WBC 11.0*   < > 6.7 6.5 5.3  --   --  5.8 5.1  NEUTROABS 8.6*  --   --   --   --   --   --   --   --   HGB 13.6    < > 13.6 12.2 12.9 12.2 11.9* 12.6 12.1  HCT 44.6   < > 43.2 40.2 41.2 36.0 35.0* 40.0 39.1  MCV 81.4   < > 78.1* 78.7* 79.1*  --   --  77.4* 78.0*  PLT 294   < > 231 216 175  --   --  220 197   < > = values in this interval not displayed.     Basic Metabolic Panel: Recent Labs  Lab 12/27/20 0315 12/28/20 0145 12/28/20 2239 12/29/20 0048 12/30/20 0121 12/30/20 1651 12/30/20 1655 12/31/20 0540 01/01/21 0114  NA  --    < > 135 133* 136 141  140 137 136  K  --    < > 4.2 4.3 4.5 4.1 4.2 4.3 3.9  CL  --    < > 107 108 110  --   --  109 109  CO2  --    < > 18* 16* 18*  --   --  20* 18*  GLUCOSE  --    < > 107* 99 76  --   --  90 100*  BUN  --    < > 38* 39* 38*  --   --  29* 22  CREATININE  --    < > 1.46* 1.47* 1.38*  --   --  1.23* 1.23*  CALCIUM  --    < > 7.9* 7.9* 7.8*  --   --  8.4* 8.2*  MG 1.8  --  2.0  --   --   --   --   --   --    < > = values in this interval not displayed.     GFR: Estimated Creatinine Clearance: 39.5 mL/min (A) (by C-G formula based on SCr of 1.23 mg/dL (H)).  Liver Function Tests: Recent Labs  Lab 12/27/20 0017 12/28/20 0145  AST 30 30  ALT 12 13  ALKPHOS 45 42  BILITOT 0.5 0.4  PROT 5.8* 5.8*  ALBUMIN 2.7* 2.6*    No results for input(s): LIPASE, AMYLASE in the last 168 hours. No results for input(s): AMMONIA in the last 168 hours.  Coagulation Profile: Recent Labs  Lab 12/29/20 0048 12/30/20 0121 12/31/20 0540 12/31/20 1231 01/01/21 0114  INR 1.9* 1.8* 1.8* 1.8* 1.7*     Cardiac Enzymes: No results for input(s): CKTOTAL, CKMB, CKMBINDEX, TROPONINI in the last 168 hours.  BNP (last 3 results) No results for input(s): PROBNP in the last 8760 hours.  Lipid Profile: No results for input(s): CHOL, HDL, LDLCALC, TRIG, CHOLHDL, LDLDIRECT in the last 72 hours.  Thyroid Function Tests: No results for input(s): TSH, T4TOTAL, FREET4, T3FREE, THYROIDAB in the last 72 hours.  Anemia Panel: No results for input(s):  VITAMINB12, FOLATE, FERRITIN, TIBC, IRON, RETICCTPCT in the last 72 hours.   Urine analysis:    Component Value Date/Time   COLORURINE YELLOW 12/26/2020 1104   APPEARANCEUR CLEAR 12/26/2020 1104   LABSPEC 1.010 12/26/2020 1104   PHURINE 8.5 (H) 12/26/2020 1104   GLUCOSEU NEGATIVE 12/26/2020 1104   HGBUR SMALL (A) 12/26/2020 1104   BILIRUBINUR NEGATIVE 12/26/2020 1104   KETONESUR NEGATIVE 12/26/2020 1104   PROTEINUR NEGATIVE 12/26/2020 1104   UROBILINOGEN 0.2 03/14/2017 1102   NITRITE POSITIVE (A) 12/26/2020 1104   LEUKOCYTESUR NEGATIVE 12/26/2020 1104    Sepsis Labs: Lactic Acid, Venous    Component Value Date/Time   LATICACIDVEN 0.86 12/15/2017 2122    MICROBIOLOGY: Recent Results (from the past 240 hour(s))  Resp Panel by RT-PCR (Flu A&B, Covid) Nasopharyngeal Swab     Status: Abnormal   Collection Time: 12/25/20 12:23 PM   Specimen: Nasopharyngeal Swab; Nasopharyngeal(NP) swabs in vial transport medium  Result Value Ref Range Status   SARS Coronavirus 2 by RT PCR POSITIVE (A) NEGATIVE Final    Comment: (NOTE) SARS-CoV-2 target nucleic acids are DETECTED.  The SARS-CoV-2 RNA is generally detectable in upper respiratory specimens during the acute phase of infection. Positive results are indicative of the presence of the identified virus, but do not rule out bacterial infection or co-infection with other pathogens not detected by the test. Clinical correlation with patient history and other  diagnostic information is necessary to determine patient infection status. The expected result is Negative.  Fact Sheet for Patients: EntrepreneurPulse.com.au  Fact Sheet for Healthcare Providers: IncredibleEmployment.be  This test is not yet approved or cleared by the [place] FDA and  has been authorized for detection and/or diagnosis of SARS-CoV-2 by FDA under an Emergency Use Authorization (EUA).  This EUA will remain in effect  (meaning this test can be used) for the duration of  the COVID-19 declaration under Section 564(b)(1) of the A ct, 21 U.S.C. section 360bbb-3(b)(1), unless the authorization is terminated or revoked sooner.     Influenza A by PCR NEGATIVE NEGATIVE Final   Influenza B by PCR NEGATIVE NEGATIVE Final    Comment: (NOTE) The Xpert Xpress SARS-CoV-2/FLU/RSV plus assay is intended as an aid in the diagnosis of influenza from Nasopharyngeal swab specimens and should not be used as a sole basis for treatment. Nasal washings and aspirates are unacceptable for Xpert Xpress SARS-CoV-2/FLU/RSV testing.  Fact Sheet for Patients: EntrepreneurPulse.com.au  Fact Sheet for Healthcare Providers: IncredibleEmployment.be  This test is not yet approved or cleared by the [place] FDA and has been authorized for detection and/or diagnosis of SARS-CoV-2 by FDA under an Emergency Use Authorization (EUA). This EUA will remain in effect (meaning this test can be used) for the duration of the COVID-19 declaration under Section 564(b)(1) of the Act, 21 U.S.C. section 360bbb-3(b)(1), unless the authorization is terminated or revoked.  Performed at [facility], [place] [address], [place], [place] [address]   MRSA Next Gen by PCR, Nasal     Status: None   Collection Time: 12/26/20 12:20 AM   Specimen: Nasal Mucosa; Nasal Swab  Result Value Ref Range Status   MRSA by PCR Next Gen NOT DETECTED NOT DETECTED Final    Comment: (NOTE) The GeneXpert MRSA Assay (FDA approved for NASAL specimens only), is one component of a comprehensive MRSA colonization surveillance program. It is not intended to diagnose MRSA infection nor to guide or monitor treatment for MRSA infections. Test performance is not FDA approved in patients less than 32 years old. Performed at [facility], [place] [address], [place], [place] [address]   Culture, blood (routine x 2)     Status: None    Collection Time: 12/26/20  2:29 PM   Specimen: BLOOD LEFT HAND  Result Value Ref Range Status   Specimen Description BLOOD LEFT HAND  Final   Special Requests   Final    BOTTLES DRAWN AEROBIC AND ANAEROBIC Blood Culture results may not be optimal due to an inadequate volume of blood received in culture bottles   Culture   Final    NO GROWTH 5 DAYS Performed at [facility], [place] [address] [place], [place] [address]    Report Status 12/31/2020 FINAL  Final  Culture, blood (routine x 2)     Status: None   Collection Time: 12/26/20  2:40 PM   Specimen: BLOOD LEFT WRIST  Result Value Ref Range Status   Specimen Description BLOOD LEFT WRIST  Final   Special Requests   Final    BOTTLES DRAWN AEROBIC ONLY Blood Culture results may not be optimal due to an inadequate volume of blood received in culture bottles   Culture   Final    NO GROWTH 5 DAYS Performed at [facility], [place] [address], [place],  [address]    Report Status 12/31/2020 FINAL  Final    RADIOLOGY  STUDIES/RESULTS: CARDIAC CATHETERIZATION  Result Date: 12/30/2020 Normal right heart pressures Unsuccessful attempt at left heart cath from the right radial artery due to radial artery loop. Recommendations: Will plan to bring her back for left heart catheterization from the right femoral artery approach tomorrow if her INR is less than 1.7. Given her elevated INR today, I did not feel that it was safe to proceed with femoral artery access today. Her right radial artery should not be used for access for future cardiac caths. NPO at midnight. Will not resume IV heparin tonight since we did obtain access into the right femoral vein today.     LOS: 5 days   Oren Binet, MD  [facility]    To contact the attending provider between 7A-7P or the covering provider during after hours 7P-7A, please log into the web site [email or web address] and access using universal [place] password for that web site. If  you do not have the password, please call the hospital operator.  01/01/2021, 10:19 AM

## 2021-01-01 NOTE — Progress Notes (Signed)
ANTICOAGULATION CONSULT NOTE - Follow up [facility] for warfarin > heparin  Indication: pulmonary embolus + AF  Allergies  Allergen Reactions   Augmentin [Amoxicillin-Pot Clavulanate] Itching and Other (See Comments)    Severe vaginal itching   Tranxene [Clorazepate] Itching   Crestor [Rosuvastatin]     Made her sick on stomach, she is able to tolerate the zocor   Penicillins Itching and Rash    Has patient had a PCN reaction causing immediate rash, facial/tongue/throat swelling, SOB or lightheadedness with hypotension: Yes Has patient had a PCN reaction causing severe rash involving mucus membranes or skin necrosis: No Has patient had a PCN reaction that required hospitalization: No Has patient had a PCN reaction occurring within the last 10 years: No If all of the above answers are "NO", then may proceed with Cephalosporin use.     Patient Measurements: Height: '5\' 7"'  (170.2 cm) Weight: 76.4 kg (168 lb 6.9 oz) IBW/kg (Calculated) : 61.6  Vital Signs: Temp: 97.9 F (36.6 C) (12/30 1123) Temp Source: Oral (12/30 1123) BP: 119/74 (12/30 1600) Pulse Rate: 95 (12/30 1450)  Labs: Recent Labs     0000 12/29/20 1917 12/30/20 0121 12/30/20 1651 12/30/20 1655 12/31/20 0540 12/31/20 1231 01/01/21 0114  HGB  --   --  12.9   < > 11.9* 12.6  --  12.1  HCT  --   --  41.2   < > 35.0* 40.0  --  39.1  PLT  --   --  175  --   --  220  --  197  LABPROT   < >  --  21.1*  --   --  20.5* 20.5* 20.2*  INR   < >  --  1.8*  --   --  1.8* 1.8* 1.7*  HEPARINUNFRC  --  0.71* 0.57  --   --   --   --   --   CREATININE  --   --  1.38*  --   --  1.23*  --  1.23*   < > = values in this interval not displayed.     Estimated Creatinine Clearance: 39.5 mL/min (A) (by C-G formula based on SCr of 1.23 mg/dL (H)).   Medical History: Past Medical History:  Diagnosis Date   Acute massive pulmonary embolism ([place]) 07/13/2012   Massive PE w/ PEA arrest 07/13/12 >TNK >IVC filter  >discharged on comadin     Anticoagulated on warfarin    Chronic diastolic CHF (congestive heart failure) ([place]) 03/15/2017   Chronic kidney disease, stage 3a ([place]) 12/15/2018   Diabetes mellitus, type 2 ([place]) 07/15/2012   with peripheral neuropathy   DM2 (diabetes mellitus, type 2) ([place]) 07/23/2012   Hyperlipemia 07/14/2012   Hyperlipidemia 02/25/2013   Hypertension 07/14/2012   Hypothyroid 06/06/2013   Large bowel stricture ([place])    s/p colectomy in 2016   Neuropathy 02/12/2015   Osteoarthritis    s/p hip and knee replacements   Pulmonary hypertension ([place]) 07/26/2016   Syncope 10/2018   Thyroid disease     Medications:  Medications Prior to Admission  Medication Sig Dispense Refill Last Dose   acetaminophen (TYLENOL) 500 MG tablet Take 500 mg by mouth every 6 (six) hours as needed for mild pain.    unk   diclofenac Sodium (VOLTAREN) 1 % GEL Apply 4 g topically 4 (four) times daily. (Patient taking differently: Apply 4 g topically daily as needed.)   unk   furosemide (LASIX) 20 MG  tablet Take 1 tablet (20 mg total) by mouth daily as needed for fluid or edema. 30 tablet 3    levothyroxine (EUTHYROX) 25 MCG tablet Take 1 tablet (25 mcg total) by mouth daily before breakfast. 90 tablet 1 12/25/2020   lisinopril (ZESTRIL) 5 MG tablet Take 1 tablet (5 mg total) by mouth daily. 90 tablet 1 12/25/2020   metFORMIN (GLUCOPHAGE-XR) 500 MG 24 hr tablet TAKE 1 TABLET BY MOUTH ONCE DAILY WITH BREAKFAST. PATIENT NEEDS AN APPT. 90 tablet 1 12/25/2020   metoprolol tartrate (LOPRESSOR) 25 MG tablet Take 1 tablet (25 mg total) by mouth 2 (two) times daily. 180 tablet 1 12/25/2020 at 0700   pantoprazole (PROTONIX) 40 MG tablet Take 1 tablet (40 mg total) by mouth daily. 30 tablet 1 12/25/2020   simvastatin (ZOCOR) 20 MG tablet Take 1 tablet (20 mg total) by mouth at bedtime. 90 tablet 1 12/24/2020   vitamin B-12 (CYANOCOBALAMIN) 1000 MCG tablet Take 1 tablet (1,000 mcg total) by mouth daily.   12/24/2020    vitamin C (ASCORBIC ACID) 500 MG tablet Take 500 mg by mouth daily.    12/24/2020   warfarin (COUMADIN) 5 MG tablet TAKE 1/2 TO 1 (ONE-HALF TO ONE) TABLET BY MOUTH DAILY AS DIRECTED BY  COUMADIN  CLINIC (Patient taking differently: Take 2.5-5 mg by mouth See admin instructions. Taking 1/2 tablet (2.5 mg) Tues, thur, Sat & Sunday, and taking 1 tablet ( 22m) on Monday, Wed, & Friday.) 80 tablet 1 12/24/2020 at 1700   Blood Glucose Monitoring Suppl (ONETOUCH VERIO) w/Device KIT 1 each by Does not apply route as directed. 1 kit 0    glucose blood (ONETOUCH VERIO) test strip USE 1 STRIP TO CHECK GLUCOSE ONCE DAILY AS DIRECTED 100 each 3    OneTouch Delica Lancets 329VMISC USE 1  TO CHECK GLUCOSE ONCE DAILY 100 each 3     Assessment: 89YOF with h/o of PE/DVT and AFib on warfarin at home. Warfarin held on admission with need for cardiac cath. Home warfarin dose is 556mMWF, 2.43m20mODs.  Pt s/p LHC, ok to restart heparin 8h after sheath removal and resume warfarin tonight. INR 1.7, CBC stable.  Goal of Therapy:  Heparin level 0.3-0.7 INR 2.5-3.5 Monitor platelets by anticoagulation protocol: Yes   Plan:  -Warfarin 43mg39m x1 tonight -Resume heparin 350 units/h no bolus at 2300 (pt previously therapeutic on this dose) -Check 8h heparin level -Daily INR   MichArrie SenatearmD, BCPS, BCCPHackettstown Regional Medical Centernical Pharmacist [phone] check AMION for all [facility] PUnion Dalebers 01/01/2021

## 2021-01-02 LAB — GLUCOSE, CAPILLARY
Glucose-Capillary: 110 mg/dL — ABNORMAL HIGH (ref 70–99)
Glucose-Capillary: 139 mg/dL — ABNORMAL HIGH (ref 70–99)
Glucose-Capillary: 125 mg/dL — ABNORMAL HIGH (ref 70–99)
Glucose-Capillary: 133 mg/dL — ABNORMAL HIGH (ref 70–99)

## 2021-01-02 LAB — CBC
HCT: 40.8 % (ref 36.0–46.0)
Hemoglobin: 12.8 g/dL (ref 12.0–15.0)
MCH: 24.6 pg — ABNORMAL LOW (ref 26.0–34.0)
MCHC: 31.4 g/dL (ref 30.0–36.0)
MCV: 78.5 fL — ABNORMAL LOW (ref 80.0–100.0)
Platelets: 212 10*3/uL (ref 150–400)
RBC: 5.2 MIL/uL — ABNORMAL HIGH (ref 3.87–5.11)
RDW: 20.4 % — ABNORMAL HIGH (ref 11.5–15.5)
WBC: 7.8 10*3/uL (ref 4.0–10.5)
nRBC: 0 % (ref 0.0–0.2)

## 2021-01-02 LAB — BASIC METABOLIC PANEL
Anion gap: 10 (ref 5–15)
BUN: 19 mg/dL (ref 8–23)
CO2: 17 mmol/L — ABNORMAL LOW (ref 22–32)
Calcium: 8.7 mg/dL — ABNORMAL LOW (ref 8.9–10.3)
Chloride: 110 mmol/L (ref 98–111)
Creatinine, Ser: 1.16 mg/dL — ABNORMAL HIGH (ref 0.44–1.00)
GFR, Estimated: 48 mL/min — ABNORMAL LOW (ref >60)
Glucose, Bld: 112 mg/dL — ABNORMAL HIGH (ref 70–99)
Potassium: 4.3 mmol/L (ref 3.5–5.1)
Sodium: 137 mmol/L (ref 135–145)

## 2021-01-02 LAB — PROTIME-INR
INR: 1.5 — ABNORMAL HIGH (ref 0.8–1.2)
Prothrombin Time: 18.1 seconds — ABNORMAL HIGH (ref 11.4–15.2)

## 2021-01-02 LAB — HEPARIN LEVEL (UNFRACTIONATED)
Heparin Unfractionated: <0.1 IU/mL — ABNORMAL LOW (ref 0.30–0.70)
Heparin Unfractionated: 0.18 IU/mL — ABNORMAL LOW (ref 0.30–0.70)

## 2021-01-02 MED ORDER — SACUBITRIL-VALSARTAN 24-26 MG PO TABS
1.0000 | ORAL_TABLET | Freq: Two times a day (BID) | ORAL | Status: DC
  Administered 2021-01-02 – 2021-01-04 (×5): 1 via ORAL
  Filled 2021-01-02 (×5): qty 1

## 2021-01-02 MED ORDER — WARFARIN SODIUM 5 MG PO TABS
5.0000 mg | ORAL_TABLET | Freq: Once | ORAL | Status: AC
  Administered 2021-01-02: 5 mg via ORAL
  Filled 2021-01-02: qty 1

## 2021-01-02 NOTE — Progress Notes (Addendum)
PROGRESS NOTE    Hannah [name]  RSW:[id number]  DOB: May 21, 1940  DOA: 12/25/2020 PCP: Wynn Banker, MD Outpatient Specialists:   Hospital course:  Presented with chest pain/troponin elevation-echo with suppressed EF and numerous wall motion abnormalities.  Given COVID-19 infection-myopericarditis also in the differential. Chest pain has resolved-being medically managed with IV heparin/aspirin/statin/beta-blocker-for LHC via femoral approach today.  Note-cardiology attempted LHC on 12/28 via radial approach-however due to access issues-this was unsuccessful   Subjective:  Patient states she is comfortable.  Denies any new complaints.  Notes she has not been on oxygen for a while.  Denies shortness of breath.  Is not sure why she is still in the hospital.   Objective: Vitals:   01/02/21 0300 01/02/21 0330 01/02/21 0738 01/02/21 1114  BP: 118/64 118/80 (!) 152/77 (!) 143/77  Pulse:  88 85 87  Resp: 15 20 18  (!) 21  Temp:  97.9 F (36.6 C) 98 F (36.7 C) 98 F (36.7 C)  TempSrc:  Oral Oral Oral  SpO2:  98% 97% 99%  Weight:      Height:        Intake/Output Summary (Last 24 hours) at 01/02/2021 1643 Last data filed at 01/02/2021 1500 Gross per 24 hour  Intake 750.15 ml  Output 1000 ml  Net -249.85 ml   Filed Weights   12/25/20 1154 12/25/20 1910 12/31/20 0627  Weight: 79.4 kg 76.4 kg 76.4 kg     Exam:  General: Patient sitting up in bed speaking in full sentences without difficulty off oxygen Eyes: sclera anicteric, conjuctiva mild injection bilaterally CVS: S1-S2, regular  Respiratory:  decreased air entry bilaterally secondary to decreased inspiratory effort, rales at bases  GI: NABS, soft, NT  LE: No edema.  Neuro: A/O x 3, Moving all extremities equally with normal strength, CN 3-12 intact, grossly nonfocal.  Psych: patient is logical and coherent, judgement and insight appear normal, mood and affect appropriate to situation.   Assessment & Plan:    Newly diagnosed HFrEF EF 30% with SWMA DDx includes ischemia vs COVID-19 infection induced myopericarditis She has diuresed well and volume status is at goal Continue metoprolol and Farxiga Patient was initiated on [facility] today Cardiology recommends outpatient transitions clinic  COVID-19 infection No respiratory symptoms, no hypoxia, chest x-ray negative for pneumonia. Main concern is for possible myocarditis. Status post treatment with remdesivir. COVID-19 CT values 23.  Paroxysmal atrial fibrillation Presently back in sinus, there was some discussion of initiating amiodarone, per cardiology Restarted back on Coumadin Continue Toprol-XL  HTN Has been low in the past Will need to follow closely with initiation of Entresto Continue Toprol-XL  CKD 3 AA Creatinine is stable  DM2 Under reasonable control with the present regimen   DVT prophylaxis: Coumadin Code Status: Full Family Communication: None today Disposition Plan:   Patient is from: Home  Anticipated Discharge Location: Home  Barriers to Discharge: Initiating Entresto  Is patient medically stable for Discharge: Not yet   Scheduled Meds:  vitamin C  500 mg Oral Daily   aspirin EC  81 mg Oral Daily   atorvastatin  40 mg Oral Daily   dapagliflozin propanediol  10 mg Oral Daily   insulin aspart  0-9 Units Subcutaneous TID WC   levothyroxine  25 mcg Oral QAC breakfast   metoprolol succinate  37.5 mg Oral QPM   pantoprazole  40 mg Oral Daily   sacubitril-valsartan  1 tablet Oral BID   sodium chloride flush  3 mL Intravenous  Q12H   sodium chloride flush  3 mL Intravenous Q12H   sodium chloride flush  3 mL Intravenous Q12H   warfarin  5 mg Oral ONCE-1600   Warfarin - Pharmacist Dosing Inpatient   Does not apply q1600   zinc sulfate  220 mg Oral Daily   Continuous Infusions:  sodium chloride     sodium chloride     sodium chloride     heparin 550 Units/hr (01/02/21 0800)    Data Reviewed:  Basic  Metabolic Panel: Recent Labs  Lab 12/27/20 0315 12/28/20 0145 12/28/20 2239 12/29/20 0048 12/30/20 0121 12/30/20 1651 12/30/20 1655 12/31/20 0540 01/01/21 0114 01/02/21 0600  NA  --    < > 135 133* 136 141 140 137 136 137  K  --    < > 4.2 4.3 4.5 4.1 4.2 4.3 3.9 4.3  CL  --    < > 107 108 110  --   --  109 109 110  CO2  --    < > 18* 16* 18*  --   --  20* 18* 17*  GLUCOSE  --    < > 107* 99 76  --   --  90 100* 112*  BUN  --    < > 38* 39* 38*  --   --  29* 22 19  CREATININE  --    < > 1.46* 1.47* 1.38*  --   --  1.23* 1.23* 1.16*  CALCIUM  --    < > 7.9* 7.9* 7.8*  --   --  8.4* 8.2* 8.7*  MG 1.8  --  2.0  --   --   --   --   --   --   --    < > = values in this interval not displayed.    CBC: Recent Labs  Lab 12/29/20 0048 12/30/20 0121 12/30/20 1651 12/30/20 1655 12/31/20 0540 01/01/21 0114 01/02/21 0600  WBC 6.5 5.3  --   --  5.8 5.1 7.8  HGB 12.2 12.9 12.2 11.9* 12.6 12.1 12.8  HCT 40.2 41.2 36.0 35.0* 40.0 39.1 40.8  MCV 78.7* 79.1*  --   --  77.4* 78.0* 78.5*  PLT 216 175  --   --  220 197 212    Studies: CARDIAC CATHETERIZATION  Result Date: 01/01/2021   Prox RCA to Mid RCA lesion is 20% stenosed. Mild plaque in the mid segment of the large, dominant RCA No obstructive disease in the LAD or Circumflex Normal LV filling pressure. Recommendations: Her cardiomyopathy is non-ischemic. Consider her uncontrolled HTN as a cause of her cardiomyopathy (SBP 220 mmHg on arrival to the cath lab earlier this week and SBP of 195 on arrival to the cath lab today). Titrate beta blocker. Add additional medical therapy for better BP control.    Principal Problem:   Chest pain with elevated troponin  Active Problems:   DM2 (diabetes mellitus, type 2) (HCC)   Hyperlipidemia   Hypothyroid   History of pulmonary embolism   Chronic diastolic CHF (congestive heart failure) (HCC)   Chronic kidney disease, stage 3a (HCC)   Hypertension   COVID-19 virus infection   Non-ST  elevation (NSTEMI) myocardial infarction (HCC)   Ischemic cardiomyopathy   PAF (paroxysmal atrial fibrillation) ([place])   Dilated cardiomyopathy ([place])   Elevated troponin     Hannah [name] Hannah [name], [facility]  If 7PM-7AM, please contact night-coverage [email or web address]   LOS: 6 days

## 2021-01-02 NOTE — Progress Notes (Signed)
ANTICOAGULATION CONSULT NOTE - Follow up [place] for warfarin + heparin  Indication: pulmonary embolus + AF  Allergies  Allergen Reactions   Augmentin [Amoxicillin-Pot Clavulanate] Itching and Other (See Comments)    Severe vaginal itching   Tranxene [Clorazepate] Itching   Crestor [Rosuvastatin]     Made her sick on stomach, she is able to tolerate the zocor   Penicillins Itching and Rash    Has patient had a PCN reaction causing immediate rash, facial/tongue/throat swelling, SOB or lightheadedness with hypotension: Yes Has patient had a PCN reaction causing severe rash involving mucus membranes or skin necrosis: No Has patient had a PCN reaction that required hospitalization: No Has patient had a PCN reaction occurring within the last 10 years: No If all of the above answers are "NO", then may proceed with Cephalosporin use.     Patient Measurements: Height: 5\' 7"  (170.2 cm) Weight: 76.4 kg (168 lb 6.9 oz) IBW/kg (Calculated) : 61.6  Vital Signs: Temp: 98 F (36.7 C) (12/31 1703) Temp Source: Oral (12/31 1703) BP: 117/51 (12/31 1703) Pulse Rate: 95 (12/31 1703)  Labs: Recent Labs    12/31/20 0540 12/31/20 1231 01/01/21 0114 01/02/21 0600 01/02/21 1627  HGB 12.6  --  12.1 12.8  --   HCT 40.0  --  39.1 40.8  --   PLT 220  --  197 212  --   LABPROT 20.5* 20.5* 20.2* 18.1*  --   INR 1.8* 1.8* 1.7* 1.5*  --   HEPARINUNFRC  --   --   --  <0.10* 0.18*  CREATININE 1.23*  --  1.23* 1.16*  --      Estimated Creatinine Clearance: 41.9 mL/min (A) (by C-G formula based on SCr of 1.16 mg/dL (H)).   Medical History: Past Medical History:  Diagnosis Date   Acute massive pulmonary embolism ([place]) 07/13/2012   Massive PE w/ PEA arrest 07/13/12 >TNK >IVC filter >discharged on comadin     Anticoagulated on warfarin    Chronic diastolic CHF (congestive heart failure) ([place]) 03/15/2017   Chronic kidney disease, stage 3a ([place]) 12/15/2018   Diabetes mellitus, type  2 ([place]) 07/15/2012   with peripheral neuropathy   DM2 (diabetes mellitus, type 2) ([place]) 07/23/2012   Hyperlipemia 07/14/2012   Hyperlipidemia 02/25/2013   Hypertension 07/14/2012   Hypothyroid 06/06/2013   Large bowel stricture ([place])    s/p colectomy in 2016   Neuropathy 02/12/2015   Osteoarthritis    s/p hip and knee replacements   Pulmonary hypertension ([place]) 07/26/2016   Syncope 10/2018   Thyroid disease     Medications:   sodium chloride     sodium chloride     sodium chloride     heparin 550 Units/hr (01/02/21 0800)     Assessment: 6 YOF with h/o of PE/DVT and AFib on warfarin at home. Warfarin held on admission with need for cardiac cath. Home warfarin dose is 5mg  MWF, 2.5mg  AODs.  Pt s/p LHC, ok to restart heparin 8h after sheath removal and resumed warfarin last night 12/30. INR 1.7, CBC stable.  Heparin level after restart is low at 0.18 on 550 units/hr. No bleeding or IV issues noted.   Goal of Therapy:  Heparin level 0.3-0.7 INR 2.5-3.5 (goal 2.5-3.5 per [facility] clinic notes, apparently had PE w/ INR 2.1 in past) Monitor platelets by anticoagulation protocol: Yes   Plan:  -Warfarin 5 mg PO x1 tonight - given -Increase IV heparin to 750 units/hr. -Check 8h heparin level -  Daily INR   Sheppard Coil PharmD., BCPS Clinical Pharmacist 01/02/2021 5:22 PM

## 2021-01-02 NOTE — Progress Notes (Signed)
ANTICOAGULATION CONSULT NOTE - Follow up Consult  Pharmacy Consult for warfarin + heparin  Indication: pulmonary embolus + AF  Allergies  Allergen Reactions   Augmentin [Amoxicillin-Pot Clavulanate] Itching and Other (See Comments)    Severe vaginal itching   Tranxene [Clorazepate] Itching   Crestor [Rosuvastatin]     Made her sick on stomach, she is able to tolerate the zocor   Penicillins Itching and Rash    Has patient had a PCN reaction causing immediate rash, facial/tongue/throat swelling, SOB or lightheadedness with hypotension: Yes Has patient had a PCN reaction causing severe rash involving mucus membranes or skin necrosis: No Has patient had a PCN reaction that required hospitalization: No Has patient had a PCN reaction occurring within the last 10 years: No If all of the above answers are "NO", then may proceed with Cephalosporin use.     Patient Measurements: Height: 5\' 7"  (170.2 cm) Weight: 76.4 kg (168 lb 6.9 oz) IBW/kg (Calculated) : 61.6  Vital Signs: Temp: 98 F (36.7 C) (12/31 0738) Temp Source: Oral (12/31 0738) BP: 152/77 (12/31 0738) Pulse Rate: 85 (12/31 0738)  Labs: Recent Labs    12/31/20 0540 12/31/20 1231 01/01/21 0114 01/02/21 0600  HGB 12.6  --  12.1 12.8  HCT 40.0  --  39.1 40.8  PLT 220  --  197 212  LABPROT 20.5* 20.5* 20.2* 18.1*  INR 1.8* 1.8* 1.7* 1.5*  HEPARINUNFRC  --   --   --  <0.10*  CREATININE 1.23*  --  1.23* 1.16*     Estimated Creatinine Clearance: 41.9 mL/min (A) (by C-G formula based on SCr of 1.16 mg/dL (H)).   Medical History: Past Medical History:  Diagnosis Date   Acute massive pulmonary embolism (HCC) 07/13/2012   Massive PE w/ PEA arrest 07/13/12 >TNK >IVC filter >discharged on comadin     Anticoagulated on warfarin    Chronic diastolic CHF (congestive heart failure) (HCC) 03/15/2017   Chronic kidney disease, stage 3a (HCC) 12/15/2018   Diabetes mellitus, type 2 (HCC) 07/15/2012   with peripheral neuropathy    DM2 (diabetes mellitus, type 2) (HCC) 07/23/2012   Hyperlipemia 07/14/2012   Hyperlipidemia 02/25/2013   Hypertension 07/14/2012   Hypothyroid 06/06/2013   Large bowel stricture (HCC)    s/p colectomy in 2016   Neuropathy 02/12/2015   Osteoarthritis    s/p hip and knee replacements   Pulmonary hypertension (HCC) 07/26/2016   Syncope 10/2018   Thyroid disease     Medications:   sodium chloride     sodium chloride     sodium chloride     heparin 550 Units/hr (01/02/21 0800)     Assessment: 12 YOF with h/o of PE/DVT and AFib on warfarin at home. Warfarin held on admission with need for cardiac cath. Home warfarin dose is 5mg  MWF, 2.5mg  AODs.  Pt s/p LHC, ok to restart heparin 8h after sheath removal and resumed warfarin last night 12/30. INR 1.7, CBC stable.  Goal of Therapy:  Heparin level 0.3-0.7 INR 2.5-3.5 (goal 2.5-3.5 per [facility] clinic notes, apparently had PE w/ INR 2.1 in past) Monitor platelets by anticoagulation protocol: Yes   Plan:  -Warfarin 5 mg PO x1 tonight -Increase IV heparin to 550 units/hr. -Check 8h heparin level -Daily INR   1/31, [facility], [facility] Clinical Pharmacist  01/02/2021 10:56 AM   [facility] pharmacy phone numbers are listed on [email or web address]

## 2021-01-02 NOTE — Progress Notes (Signed)
Progress Note   Subjective   Doing well today, the patient denies CP or SOB.  No new concerns  Inpatient Medications    Scheduled Meds:  vitamin C  500 mg Oral Daily   aspirin EC  81 mg Oral Daily   atorvastatin  40 mg Oral Daily   dapagliflozin propanediol  10 mg Oral Daily   insulin aspart  0-9 Units Subcutaneous TID WC   levothyroxine  25 mcg Oral QAC breakfast   metoprolol succinate  37.5 mg Oral QPM   pantoprazole  40 mg Oral Daily   sodium chloride flush  3 mL Intravenous Q12H   sodium chloride flush  3 mL Intravenous Q12H   sodium chloride flush  3 mL Intravenous Q12H   Warfarin - Pharmacist Dosing Inpatient   Does not apply q1600   zinc sulfate  220 mg Oral Daily   Continuous Infusions:  sodium chloride     sodium chloride     sodium chloride     heparin 550 Units/hr (01/02/21 0800)   PRN Meds: sodium chloride, sodium chloride, sodium chloride, acetaminophen, albuterol, guaiFENesin-dextromethorphan, ondansetron **OR** ondansetron (ZOFRAN) IV, sodium chloride flush, sodium chloride flush, sodium chloride flush   Vital Signs    Vitals:   01/01/21 2326 01/02/21 0300 01/02/21 0330 01/02/21 0738  BP: 124/73 118/64 118/80 (!) 152/77  Pulse: 82  88 85  Resp: 14 15 20 18   Temp: 97.8 F (36.6 C)  97.9 F (36.6 C) 98 F (36.7 C)  TempSrc: Oral  Oral Oral  SpO2: 99%  98% 97%  Weight:      Height:        Intake/Output Summary (Last 24 hours) at 01/02/2021 1001 Last data filed at 01/02/2021 0800 Gross per 24 hour  Intake 603.45 ml  Output 1000 ml  Net -396.55 ml   Filed Weights   12/25/20 1154 12/25/20 1910 12/31/20 0627  Weight: 79.4 kg 76.4 kg 76.4 kg    Telemetry    sinus - Personally Reviewed  Physical Exam   GEN- The patient is well appearing, alert and oriented x 3 today.   Head- normocephalic, atraumatic Eyes-  Sclera clear, conjunctiva pink Ears- hearing intact Oropharynx- clear Neck- supple, Lungs-  normal work of breathing Heart-  Regular rate and rhythm  GI- soft  Extremities- no clubbing, cyanosis, or edema   Labs    Chemistry Recent Labs  Lab 12/27/20 0017 12/28/20 0145 12/28/20 2239 12/31/20 0540 01/01/21 0114 01/02/21 0600  NA 134* 133*   < > 137 136 137  K 3.9 4.4   < > 4.3 3.9 4.3  CL 105 105   < > 109 109 110  CO2 20* 18*   < > 20* 18* 17*  GLUCOSE 117* 92   < > 90 100* 112*  BUN 21 30*   < > 29* 22 19  CREATININE 1.31* 1.47*   < > 1.23* 1.23* 1.16*  CALCIUM 8.3* 8.0*   < > 8.4* 8.2* 8.7*  PROT 5.8* 5.8*  --   --   --   --   ALBUMIN 2.7* 2.6*  --   --   --   --   AST 30 30  --   --   --   --   ALT 12 13  --   --   --   --   ALKPHOS 45 42  --   --   --   --   BILITOT 0.5 0.4  --   --   --   --  GFRNONAA 41* 36*   < > 45* NOT CALCULATED 48*  ANIONGAP 9 10   < > 8 9 10    < > = values in this interval not displayed.     Hematology Recent Labs  Lab 12/31/20 0540 01/01/21 0114 01/02/21 0600  WBC 5.8 5.1 7.8  RBC 5.17* 5.01 5.20*  HGB 12.6 12.1 12.8  HCT 40.0 39.1 40.8  MCV 77.4* 78.0* 78.5*  MCH 24.4* 24.2* 24.6*  MCHC 31.5 30.9 31.4  RDW 20.5* 20.1* 20.4*  PLT 220 197 212     Patient ID  80 y.o. female with history of PEA arrest due to large PE in 2014 on chronic warfarin, HTN, HLD, DMII, large bowel stricture s/p colectomy with ostomy in place, and CKD IIIB who presented to the hospital with chest pain and elevated troponin for which Cardiology was consulted. Also noted to be COVID positive on admission. TTE with EF 30-35% with multiple WMA. Awaiting coronary angiography due to NSTEMI and new cardiomyopathy  Assessment & Plan    1.  Nonischemic CM EF 30% Likely hypertensive in etiology Cath reviewed from yesterday showing no obstructive CAD Will start entresto today and titrate as able Strict Is and Os and daily weights Would benefit from outpatient transitions clinic  2. Paroxysmal atrial fibrillation/ atrial flutter Now in sinus Transition back to coumadin at this  time  3. CKI Stable No change required today  4. Prior DVT/ PTE Resume coumadin  5. COVID 19+ On remdesivir  Continue heparin until INR>2 or time of discharge. Would keep in hospital today while starting entresto. Anticipate discharge tomorrow if clinically stable with close outpatient transitions of care follow-up.   2015 MD, [facility] 01/02/2021 10:01 AM

## 2021-01-02 NOTE — Progress Notes (Signed)
Physical Therapy Treatment Patient Details Name: Hannah [name] MRN: [id number] DOB: 1940/06/02 Today's Date: 01/02/2021   History of Present Illness Pt is a 80 y.o. female admitted 12/25/20 with chest pain. Pt with (+)COVID, elevated troponins with ?myocarditis or pericarditis associated with COVID. Plan for LHC/RHC on 12/27. PMH includes PEA arrest from large PE (2014) on Coumadin, DM2, HTN, HLD, large bowel stricture-s/p colectomy with ostomy in place.    PT Comments    The pt was able to make good progress with OOB mobility and functional LE strengthening exercises. HR elevation to 128bpm with gait and exercises in the room, but other VSS. The pt continues to present with flat affect and mild memory deficits, but otherwise follows all cues/instructions well at this time. Continue to recommend d/c home with HHPT when medically stable.     Recommendations for follow up therapy are one component of a multi-disciplinary discharge planning process, led by the attending physician.  Recommendations may be updated based on patient status, additional functional criteria and insurance authorization.  Follow Up Recommendations  Home health PT     Assistance Recommended at Discharge Intermittent Supervision/Assistance  Equipment Recommendations  None recommended by PT    Recommendations for Other Services       Precautions / Restrictions Precautions Precautions: Fall;Other (comment) Precaution Comments: ostomy Restrictions Weight Bearing Restrictions: No     Mobility  Bed Mobility Overal bed mobility: Modified Independent             General bed mobility comments: increased time and use of bed rails, able to complete without assist    Transfers Overall transfer level: Needs assistance Equipment used: Rolling walker (2 wheels) Transfers: Sit to/from Stand Sit to Stand: Min assist;Min guard           General transfer comment: increased time and effort, minA initially,  progressed to minG with continued reps    Ambulation/Gait Ambulation/Gait assistance: Min guard Gait Distance (Feet): 30 Feet Assistive device: Rolling walker (2 wheels) Gait Pattern/deviations: Step-through pattern;Decreased stride length;Trunk flexed Gait velocity: Decreased Gait velocity interpretation: <1.31 ft/sec, indicative of household ambulator   General Gait Details: Slow, steady gait with rollator and initial min guard for balance, progressing to supervision; pt ambulating laps in room (COVID precautions preventing hallway ambulation)      Balance Overall balance assessment: Needs assistance Sitting-balance support: No upper extremity supported;Feet supported Sitting balance-Leahy Scale: Good Sitting balance - Comments: able to apply lotion to lower legs/feet without assist   Standing balance support: Reliant on assistive device for balance;Bilateral upper extremity supported;No upper extremity supported Standing balance-Leahy Scale: Fair Standing balance comment: can stand without support at sink for grooming tasks, use of BUE support for mobility                            Cognition Arousal/Alertness: Awake/alert Behavior During Therapy: WFL for tasks assessed/performed;Flat affect Overall Cognitive Status: No family/caregiver present to determine baseline cognitive functioning                                 General Comments: pt with mild memory deficits in session, asking same question multiple times with no real of previous answer        Exercises Other Exercises Other Exercises: 5x sit-stand from recliner    General Comments General comments (skin integrity, edema, etc.): HR to 128 bpm with ambulation  Pertinent Vitals/Pain Pain Assessment: No/denies pain     PT Goals (current goals can now be found in the care plan section) Acute Rehab PT Goals Patient Stated Goal: keep up her strength PT Goal Formulation: With  patient Time For Goal Achievement: 01/09/21 Potential to Achieve Goals: Good Progress towards PT goals: Progressing toward goals    Frequency    Min 3X/week      PT Plan Current plan remains appropriate       AM-PAC PT "6 Clicks" Mobility   Outcome Measure  Help needed turning from your back to your side while in a flat bed without using bedrails?: None Help needed moving from lying on your back to sitting on the side of a flat bed without using bedrails?: A Little Help needed moving to and from a bed to a chair (including a wheelchair)?: A Little Help needed standing up from a chair using your arms (e.g., wheelchair or bedside chair)?: A Little Help needed to walk in hospital room?: A Little Help needed climbing 3-5 steps with a railing? : A Little 6 Click Score: 19    End of Session Equipment Utilized During Treatment: Gait belt Activity Tolerance: Patient tolerated treatment well Patient left: in bed;with call bell/phone within reach;with bed alarm set Nurse Communication: Mobility status PT Visit Diagnosis: Difficulty in walking, not elsewhere classified (R26.2);Muscle weakness (generalized) (M62.81)     Time: 3267-1245 PT Time Calculation (min) (ACUTE ONLY): 30 min  Charges:  $Therapeutic Exercise: 23-37 mins                     Vickki Muff, PT, DPT   Acute Rehabilitation Department Pager #: [phone]   Ronnie Derby 01/02/2021, 1:06 PM

## 2021-01-03 LAB — CBC
HCT: 39.6 % (ref 36.0–46.0)
Hemoglobin: 12.2 g/dL (ref 12.0–15.0)
MCH: 23.9 pg — ABNORMAL LOW (ref 26.0–34.0)
MCHC: 30.8 g/dL (ref 30.0–36.0)
MCV: 77.6 fL — ABNORMAL LOW (ref 80.0–100.0)
Platelets: 190 10*3/uL (ref 150–400)
RBC: 5.1 MIL/uL (ref 3.87–5.11)
RDW: 20.1 % — ABNORMAL HIGH (ref 11.5–15.5)
WBC: 7.9 10*3/uL (ref 4.0–10.5)
nRBC: 0 % (ref 0.0–0.2)

## 2021-01-03 LAB — PROTIME-INR
INR: 2.2 — ABNORMAL HIGH (ref 0.8–1.2)
Prothrombin Time: 24.3 seconds — ABNORMAL HIGH (ref 11.4–15.2)

## 2021-01-03 LAB — BASIC METABOLIC PANEL
Anion gap: 11 (ref 5–15)
BUN: 16 mg/dL (ref 8–23)
CO2: 19 mmol/L — ABNORMAL LOW (ref 22–32)
Calcium: 8.5 mg/dL — ABNORMAL LOW (ref 8.9–10.3)
Chloride: 107 mmol/L (ref 98–111)
Creatinine, Ser: 1.22 mg/dL — ABNORMAL HIGH (ref 0.44–1.00)
GFR, Estimated: 45 mL/min — ABNORMAL LOW (ref >60)
Glucose, Bld: 118 mg/dL — ABNORMAL HIGH (ref 70–99)
Potassium: 4.4 mmol/L (ref 3.5–5.1)
Sodium: 137 mmol/L (ref 135–145)

## 2021-01-03 LAB — HEPARIN LEVEL (UNFRACTIONATED): Heparin Unfractionated: 0.83 IU/mL — ABNORMAL HIGH (ref 0.30–0.70)

## 2021-01-03 LAB — GLUCOSE, CAPILLARY
Glucose-Capillary: 113 mg/dL — ABNORMAL HIGH (ref 70–99)
Glucose-Capillary: 129 mg/dL — ABNORMAL HIGH (ref 70–99)
Glucose-Capillary: 113 mg/dL — ABNORMAL HIGH (ref 70–99)
Glucose-Capillary: 146 mg/dL — ABNORMAL HIGH (ref 70–99)

## 2021-01-03 MED ORDER — WARFARIN SODIUM 5 MG PO TABS
5.0000 mg | ORAL_TABLET | Freq: Once | ORAL | Status: AC
  Administered 2021-01-03: 5 mg via ORAL
  Filled 2021-01-03: qty 1

## 2021-01-03 NOTE — Progress Notes (Signed)
Progress Note   Subjective   Doing well today, the patient denies CP or SOB.  No new concerns  Inpatient Medications    Scheduled Meds:  vitamin C  500 mg Oral Daily   aspirin EC  81 mg Oral Daily   atorvastatin  40 mg Oral Daily   dapagliflozin propanediol  10 mg Oral Daily   insulin aspart  0-9 Units Subcutaneous TID WC   levothyroxine  25 mcg Oral QAC breakfast   metoprolol succinate  37.5 mg Oral QPM   pantoprazole  40 mg Oral Daily   sacubitril-valsartan  1 tablet Oral BID   sodium chloride flush  3 mL Intravenous Q12H   sodium chloride flush  3 mL Intravenous Q12H   sodium chloride flush  3 mL Intravenous Q12H   Warfarin - Pharmacist Dosing Inpatient   Does not apply q1600   zinc sulfate  220 mg Oral Daily   Continuous Infusions:  sodium chloride     sodium chloride     sodium chloride     heparin 600 Units/hr (01/03/21 0639)   PRN Meds: sodium chloride, sodium chloride, sodium chloride, acetaminophen, albuterol, guaiFENesin-dextromethorphan, ondansetron **OR** ondansetron (ZOFRAN) IV, sodium chloride flush, sodium chloride flush, sodium chloride flush   Vital Signs    Vitals:   01/02/21 2000 01/02/21 2316 01/03/21 0414 01/03/21 0734  BP: 125/68 (!) 142/79 119/66 126/60  Pulse: 81 80 82 80  Resp: 15 17 18 12   Temp: 98.3 F (36.8 C) 97.9 F (36.6 C) 97.9 F (36.6 C) 97.8 F (36.6 C)  TempSrc: Oral Oral Oral Oral  SpO2: 100% 99% 98% 98%  Weight:      Height:        Intake/Output Summary (Last 24 hours) at 01/03/2021 0954 Last data filed at 01/03/2021 0800 Gross per 24 hour  Intake 652.53 ml  Output 800 ml  Net -147.47 ml   Filed Weights   12/25/20 1154 12/25/20 1910 12/31/20 0627  Weight: 79.4 kg 76.4 kg 76.4 kg    Telemetry    sinus - Personally Reviewed  Physical Exam   GEN- The patient is well appearing, alert and oriented x 3 today.   Head- normocephalic, atraumatic Eyes-  Sclera clear, conjunctiva pink Ears- hearing intact Oropharynx-  clear Neck- supple, Lungs-  normal work of breathing Heart- Regular rate and rhythm  GI- soft  Extremities- no clubbing, cyanosis, or edema    Labs    Chemistry Recent Labs  Lab 12/28/20 0145 12/28/20 2239 01/01/21 0114 01/02/21 0600 01/03/21 0448  NA 133*   < > 136 137 137  K 4.4   < > 3.9 4.3 4.4  CL 105   < > 109 110 107  CO2 18*   < > 18* 17* 19*  GLUCOSE 92   < > 100* 112* 118*  BUN 30*   < > 22 19 16   CREATININE 1.47*   < > 1.23* 1.16* 1.22*  CALCIUM 8.0*   < > 8.2* 8.7* 8.5*  PROT 5.8*  --   --   --   --   ALBUMIN 2.6*  --   --   --   --   AST 30  --   --   --   --   ALT 13  --   --   --   --   ALKPHOS 42  --   --   --   --   BILITOT 0.4  --   --   --   --  GFRNONAA 36*   < > NOT CALCULATED 48* 45*  ANIONGAP 10   < > 9 10 11    < > = values in this interval not displayed.     Hematology Recent Labs  Lab 01/01/21 0114 01/02/21 0600 01/03/21 0448  WBC 5.1 7.8 7.9  RBC 5.01 5.20* 5.10  HGB 12.1 12.8 12.2  HCT 39.1 40.8 39.6  MCV 78.0* 78.5* 77.6*  MCH 24.2* 24.6* 23.9*  MCHC 30.9 31.4 30.8  RDW 20.1* 20.4* 20.1*  PLT 197 212 190     Patient ID  81 y.o. female with history of PEA arrest due to large PE in 2014 on chronic warfarin, HTN, HLD, DMII, large bowel stricture s/p colectomy with ostomy in place, and CKD IIIB who presented to the hospital with COVID. TTE with EF 30-35% with multiple WMA. Cath without obstructive CAD.  Assessment & Plan    1.  Nonischemic CM Doing well with medical therapy Entresto started yesterday Continue to titrate entresto as able Daily weights  Sodium restriction Start lasix 20mg  po daily at discharge with close transitions of care follow-up.  2. Paroxysmal atrial fibrillation/ atrial flutter Now in sinus Therapeutic INR Stop heparin Continue coumadin Stop ASA I would not advise antiarrhythmic medicine at this time  3. CKI Stable No change required today  4. Prior DVT/PTE On coumadin  5. Covid + Per  primary team   Thompson Grayer MD, [facility] 01/03/2021 9:54 AM

## 2021-01-03 NOTE — Progress Notes (Signed)
PROGRESS NOTE    Hannah [name]  [id number]  DOB: [birth year]  DOA: 12/25/2020 PCP: [name], MD Outpatient Specialists:   Hospital course:  Presented with chest pain/troponin elevation-echo with suppressed EF and numerous wall motion abnormalities.  Given COVID-19 infection-myopericarditis also in the differential. Chest pain has resolved-being medically managed with IV heparin/aspirin/statin/beta-blocker-for LHC via femoral approach today.  Note-cardiology attempted LHC on 12/28 via radial approach-however due to access issues-this was unsuccessful   Subjective:  Patient denies new complaints.  States she feels okay.  Notes that she lives alone however has 2 daughters who live right down the road and 7 grandsons who are not there to help her if necessary   Objective: Vitals:   01/03/21 0414 01/03/21 0734 01/03/21 1124 01/03/21 1524  BP: 119/66 126/60 130/69 136/76  Pulse: 82 80 83 91  Resp: 18 12 20 19   Temp: 97.9 F (36.6 C) 97.8 F (36.6 C) 98 F (36.7 C) 98.3 F (36.8 C)  TempSrc: Oral Oral Oral Oral  SpO2: 98% 98% 99% 99%  Weight:      Height:        Intake/Output Summary (Last 24 hours) at 01/03/2021 1707 Last data filed at 01/03/2021 1145 Gross per 24 hour  Intake 480 ml  Output 800 ml  Net -320 ml    Filed Weights   12/25/20 1154 12/25/20 1910 12/31/20 0627  Weight: 79.4 kg 76.4 kg 76.4 kg     Exam:  General: Patient sitting up in bed speaking in full sentences without difficulty off oxygen Eyes: sclera anicteric, conjuctiva mild injection bilaterally CVS: S1-S2, regular  Respiratory:  decreased air entry bilaterally secondary to decreased inspiratory effort, rales at bases  GI: NABS, soft, NT  LE: No edema.  Neuro: A/O x 3, Moving all extremities equally with normal strength, CN 3-12 intact, grossly nonfocal.  Psych: patient is logical and coherent, judgement and insight appear normal, mood and affect appropriate to  situation.   Assessment & Plan:   Newly diagnosed HFrEF EF 30% with SWMA DDx includes ischemia vs COVID-19 infection induced myopericarditis She has diuresed well and volume status is at goal Continue metoprolol and Farxiga Patient was initiated on Entresto yesterday and is tolerating well, creatinine is at baseline and BP is stable. Will discuss with cardiology however would plan to discharge in the morning with follow-up in cardiology outpatient transitions clinic  COVID-19 infection No respiratory symptoms, no hypoxia, chest x-ray negative for pneumonia. Main concern is for possible myocarditis. Status post treatment with remdesivir. Patient was diagnosed on 10/25/2020 and is now 7 days postdiagnosis. COVID-19 CT values 23.  Paroxysmal atrial fibrillation Presently back in sinus, there was some discussion of initiating amiodarone, per cardiology Restarted back on Coumadin Continue Toprol-XL  HTN Has been low in the past Will need to follow closely with initiation of Entresto Continue Toprol-XL  CKD 3 AA Creatinine is stable  DM2 Under reasonable control with the present regimen   DVT prophylaxis: Coumadin Code Status: Full Family Communication: None today Disposition Plan:   Patient is from: Home  Anticipated Discharge Location: Home  Barriers to Discharge: Initiating Entresto  Is patient medically stable for Discharge: Not yet   Scheduled Meds:  vitamin C  500 mg Oral Daily   atorvastatin  40 mg Oral Daily   dapagliflozin propanediol  10 mg Oral Daily   insulin aspart  0-9 Units Subcutaneous TID WC   levothyroxine  25 mcg Oral QAC breakfast   metoprolol succinate  37.5 mg Oral QPM   pantoprazole  40 mg Oral Daily   sacubitril-valsartan  1 tablet Oral BID   sodium chloride flush  3 mL Intravenous Q12H   sodium chloride flush  3 mL Intravenous Q12H   sodium chloride flush  3 mL Intravenous Q12H   warfarin  5 mg Oral ONCE-1600   Warfarin - Pharmacist  Dosing Inpatient   Does not apply q1600   zinc sulfate  220 mg Oral Daily   Continuous Infusions:  sodium chloride     sodium chloride     sodium chloride      Data Reviewed:  Basic Metabolic Panel: Recent Labs  Lab 12/28/20 2239 12/29/20 0048 12/30/20 0121 12/30/20 1651 12/30/20 1655 12/31/20 0540 01/01/21 0114 01/02/21 0600 01/03/21 0448  NA 135   < > 136   < > 140 137 136 137 137  K 4.2   < > 4.5   < > 4.2 4.3 3.9 4.3 4.4  CL 107   < > 110  --   --  109 109 110 107  CO2 18*   < > 18*  --   --  20* 18* 17* 19*  GLUCOSE 107*   < > 76  --   --  90 100* 112* 118*  BUN 38*   < > 38*  --   --  29* 22 19 16   CREATININE 1.46*   < > 1.38*  --   --  1.23* 1.23* 1.16* 1.22*  CALCIUM 7.9*   < > 7.8*  --   --  8.4* 8.2* 8.7* 8.5*  MG 2.0  --   --   --   --   --   --   --   --    < > = values in this interval not displayed.     CBC: Recent Labs  Lab 12/30/20 0121 12/30/20 1651 12/30/20 1655 12/31/20 0540 01/01/21 0114 01/02/21 0600 01/03/21 0448  WBC 5.3  --   --  5.8 5.1 7.8 7.9  HGB 12.9   < > 11.9* 12.6 12.1 12.8 12.2  HCT 41.2   < > 35.0* 40.0 39.1 40.8 39.6  MCV 79.1*  --   --  77.4* 78.0* 78.5* 77.6*  PLT 175  --   --  220 197 212 190   < > = values in this interval not displayed.     Studies: No results found.  Principal Problem:   Chest pain with elevated troponin  Active Problems:   DM2 (diabetes mellitus, type 2) (HCC)   Hyperlipidemia   Hypothyroid   History of pulmonary embolism   Chronic diastolic CHF (congestive heart failure) (HCC)   Chronic kidney disease, stage 3a (HCC)   Hypertension   COVID-19 virus infection   Non-ST elevation (NSTEMI) myocardial infarction (HCC)   Ischemic cardiomyopathy   PAF (paroxysmal atrial fibrillation) ([place])   Dilated cardiomyopathy ([place])   Elevated troponin     Raghad Lorenz Tublu Elbert Polyakov, [facility]  If 7PM-7AM, please contact night-coverage [email or web address]   LOS: 7 days

## 2021-01-03 NOTE — Progress Notes (Signed)
[facility] for heparin  Indication: pulmonary embolus + AF Brief A/P: Heparin level supratherapeutic Decrease Heparin rate  Allergies  Allergen Reactions   Augmentin [Amoxicillin-Pot Clavulanate] Itching and Other (See Comments)    Severe vaginal itching   Tranxene [Clorazepate] Itching   Crestor [Rosuvastatin]     Made her sick on stomach, she is able to tolerate the zocor   Penicillins Itching and Rash    Has patient had a PCN reaction causing immediate rash, facial/tongue/throat swelling, SOB or lightheadedness with hypotension: Yes Has patient had a PCN reaction causing severe rash involving mucus membranes or skin necrosis: No Has patient had a PCN reaction that required hospitalization: No Has patient had a PCN reaction occurring within the last 10 years: No If all of the above answers are "NO", then may proceed with Cephalosporin use.     Patient Measurements: Height: 5\' 7"  (170.2 cm) Weight: 76.4 kg (168 lb 6.9 oz) IBW/kg (Calculated) : 61.6  Vital Signs: Temp: 97.9 F (36.6 C) (01/01 0414) Temp Source: Oral (01/01 0414) BP: 119/66 (01/01 0414) Pulse Rate: 82 (01/01 0414)  Labs: Recent Labs    12/31/20 0540 12/31/20 1231 01/01/21 0114 01/02/21 0600 01/02/21 1627 01/03/21 0448  HGB 12.6  --  12.1 12.8  --   --   HCT 40.0  --  39.1 40.8  --   --   PLT 220  --  197 212  --   --   LABPROT 20.5* 20.5* 20.2* 18.1*  --   --   INR 1.8* 1.8* 1.7* 1.5*  --   --   HEPARINUNFRC  --   --   --  <0.10* 0.18* 0.83*  CREATININE 1.23*  --  1.23* 1.16*  --  1.22*     Estimated Creatinine Clearance: 39.8 mL/min (A) (by C-G formula based on SCr of 1.22 mg/dL (H)).  Assessment: 30 YOF with h/o of PE/DVT and AFib, INR subtherapeutic, for heparin  Goal of Therapy:  Heparin level 0.3-0.7 INR 2.5-3.5 (goal 2.5-3.5 per [facility] clinic notes, apparently had PE w/ INR 2.1 in past) Monitor platelets by anticoagulation protocol: Yes   Plan:   Decrease Heparin 600 units/hr Check heparin level in 8 hours.  Phillis Knack, PharmD, BCPS  01/03/2021 5:38 AM

## 2021-01-03 NOTE — Plan of Care (Signed)
°  Problem: Education: Goal: Knowledge of General Education information will improve Description: Including pain rating scale, medication(s)/side effects and non-pharmacologic comfort measures Outcome: Progressing   Problem: Health Behavior/Discharge Planning: Goal: Ability to manage health-related needs will improve Outcome: Progressing   Problem: Clinical Measurements: Goal: Ability to maintain clinical measurements within normal limits will improve Outcome: Progressing Goal: Will remain free from infection Outcome: Progressing Goal: Diagnostic test results will improve Outcome: Progressing Goal: Respiratory complications will improve Outcome: Progressing Goal: Cardiovascular complication will be avoided Outcome: Progressing   Problem: Activity: Goal: Risk for activity intolerance will decrease Outcome: Progressing   Problem: Nutrition: Goal: Adequate nutrition will be maintained Outcome: Progressing   Problem: Coping: Goal: Level of anxiety will decrease Outcome: Progressing   Problem: Elimination: Goal: Will not experience complications related to bowel motility Outcome: Progressing Goal: Will not experience complications related to urinary retention Outcome: Progressing   Problem: Pain Managment: Goal: General experience of comfort will improve Outcome: Progressing   Problem: Safety: Goal: Ability to remain free from injury will improve Outcome: Progressing   Problem: Skin Integrity: Goal: Risk for impaired skin integrity will decrease Outcome: Progressing   Problem: Education: Goal: Knowledge of risk factors and measures for prevention of condition will improve Outcome: Progressing   Problem: Coping: Goal: Psychosocial and spiritual needs will be supported Outcome: Progressing   Problem: Respiratory: Goal: Will maintain a patent airway Outcome: Progressing Goal: Complications related to the disease process, condition or treatment will be avoided or  minimized Outcome: Progressing   Problem: Education: Goal: Understanding of cardiac disease, CV risk reduction, and recovery process will improve Outcome: Progressing Goal: Individualized Educational Video(s) Outcome: Progressing   Problem: Activity: Goal: Ability to tolerate increased activity will improve Outcome: Progressing   Problem: Cardiac: Goal: Ability to achieve and maintain adequate cardiovascular perfusion will improve Outcome: Progressing   Problem: Health Behavior/Discharge Planning: Goal: Ability to safely manage health-related needs after discharge will improve Outcome: Progressing

## 2021-01-03 NOTE — Progress Notes (Signed)
ANTICOAGULATION CONSULT NOTE - Follow up Hannah [name] for warfarin + stop heparin Indication: pulmonary embolus + AF  Allergies  Allergen Reactions   Augmentin [Amoxicillin-Pot Clavulanate] Itching and Other (See Comments)    Severe vaginal itching   Tranxene [Clorazepate] Itching   Crestor [Rosuvastatin]     Made her sick on stomach, she is able to tolerate the zocor   Penicillins Itching and Rash    Has patient had a PCN reaction causing immediate rash, facial/tongue/throat swelling, SOB or lightheadedness with hypotension: Yes Has patient had a PCN reaction causing severe rash involving mucus membranes or skin necrosis: No Has patient had a PCN reaction that required hospitalization: No Has patient had a PCN reaction occurring within the last 10 years: No If all of the above answers are "NO", then may proceed with Cephalosporin use.     Patient Measurements: Height: 5\' 7"  (170.2 cm) Weight: 76.4 kg (168 lb 6.9 oz) IBW/kg (Calculated) : 61.6  Vital Signs: Temp: 97.8 F (36.6 C) (01/01 0734) Temp Source: Oral (01/01 0734) BP: 126/60 (01/01 0734) Pulse Rate: 80 (01/01 0734)  Labs: Recent Labs    01/01/21 0114 01/02/21 0600 01/02/21 1627 01/03/21 0448  HGB 12.1 12.8  --  12.2  HCT 39.1 40.8  --  39.6  PLT 197 212  --  190  LABPROT 20.2* 18.1*  --  24.3*  INR 1.7* 1.5*  --  2.2*  HEPARINUNFRC  --  <0.10* 0.18* 0.83*  CREATININE 1.23* 1.16*  --  1.22*     Estimated Creatinine Clearance: 39.8 mL/min (A) (by C-G formula based on SCr of 1.22 mg/dL (H)).   Medical History: Past Medical History:  Diagnosis Date   Acute massive pulmonary embolism ([place]) 07/13/2012   Massive PE w/ PEA arrest 07/13/12 >TNK >IVC filter >discharged on comadin     Anticoagulated on warfarin    Chronic diastolic CHF (congestive heart failure) ([place]) 03/15/2017   Chronic kidney disease, stage 3a ([place]) 12/15/2018   Diabetes mellitus, type 2 ([place]) 07/15/2012   with peripheral  neuropathy   DM2 (diabetes mellitus, type 2) ([place]) 07/23/2012   Hyperlipemia 07/14/2012   Hyperlipidemia 02/25/2013   Hypertension 07/14/2012   Hypothyroid 06/06/2013   Large bowel stricture ([place])    s/p colectomy in 2016   Neuropathy 02/12/2015   Osteoarthritis    s/p hip and knee replacements   Pulmonary hypertension ([place]) 07/26/2016   Syncope 10/2018   Thyroid disease     Medications:   sodium chloride     sodium chloride     sodium chloride       Assessment: 89 YOF with h/o of PE/DVT and AFib on warfarin at home. Warfarin held on admission with need for cardiac cath. Home warfarin dose is 5mg  MWF, 2.5mg  AODs.  Pt s/p LHC, heparin restarted after sheath removal and resumed warfarin 12/30.   Today's INR 2.2, CBC stable.  No overt bleeding or complications noted.  Goal of Therapy:  Heparin level 0.3-0.7 INR 2.5-3.5 (goal 2.5-3.5 per [facility] clinic notes, apparently had PE w/ INR 2.1 in past) Monitor platelets by anticoagulation protocol: Yes   Plan:  -Warfarin 5 mg PO x1 tonight -Discussed with Dr. Rayann Heman, INR goal 2.5-3.5 due to hx recurrent PE, but no mechanical valve, etc.  OK to stop heparin with INR > 2.  [place] Crane, Vena Austria, BCPS, [facility] Clinical Pharmacist  01/03/2021 11:01 AM   [facility] pharmacy phone numbers are listed on [email or web address]

## 2021-01-04 ENCOUNTER — Other Ambulatory Visit (HOSPITAL_COMMUNITY): Payer: Self-pay

## 2021-01-04 LAB — CBC
HCT: 38 % (ref 36.0–46.0)
Hemoglobin: 11.8 g/dL — ABNORMAL LOW (ref 12.0–15.0)
MCH: 24 pg — ABNORMAL LOW (ref 26.0–34.0)
MCHC: 31.1 g/dL (ref 30.0–36.0)
MCV: 77.4 fL — ABNORMAL LOW (ref 80.0–100.0)
Platelets: 245 10*3/uL (ref 150–400)
RBC: 4.91 MIL/uL (ref 3.87–5.11)
RDW: 20.1 % — ABNORMAL HIGH (ref 11.5–15.5)
WBC: 7.9 10*3/uL (ref 4.0–10.5)
nRBC: 0 % (ref 0.0–0.2)

## 2021-01-04 LAB — PROTIME-INR
INR: 2.2 — ABNORMAL HIGH (ref 0.8–1.2)
Prothrombin Time: 24 seconds — ABNORMAL HIGH (ref 11.4–15.2)

## 2021-01-04 LAB — GLUCOSE, CAPILLARY
Glucose-Capillary: 108 mg/dL — ABNORMAL HIGH (ref 70–99)
Glucose-Capillary: 121 mg/dL — ABNORMAL HIGH (ref 70–99)

## 2021-01-04 MED ORDER — SACUBITRIL-VALSARTAN 24-26 MG PO TABS
1.0000 | ORAL_TABLET | Freq: Two times a day (BID) | ORAL | 0 refills | Status: DC
  Filled 2021-01-04: qty 60, 30d supply, fill #0

## 2021-01-04 MED ORDER — ATORVASTATIN CALCIUM 40 MG PO TABS
40.0000 mg | ORAL_TABLET | Freq: Every day | ORAL | 0 refills | Status: DC
  Filled 2021-01-04: qty 30, 30d supply, fill #0

## 2021-01-04 MED ORDER — WARFARIN SODIUM 7.5 MG PO TABS: 7.5000 mg | ORAL_TABLET | Freq: Once | ORAL | Status: DC

## 2021-01-04 MED ORDER — DAPAGLIFLOZIN PROPANEDIOL 10 MG PO TABS
10.0000 mg | ORAL_TABLET | Freq: Every day | ORAL | 0 refills | Status: DC
  Filled 2021-01-04: qty 30, 30d supply, fill #0

## 2021-01-04 MED ORDER — METOPROLOL SUCCINATE ER 25 MG PO TB24
37.5000 mg | ORAL_TABLET | Freq: Every evening | ORAL | 0 refills | Status: DC
Start: 2021-01-04 — End: 2021-02-17
  Filled 2021-01-04: qty 45, 30d supply, fill #0

## 2021-01-04 MED ORDER — ENTRESTO 24-26 MG PO TABS
1.0000 | ORAL_TABLET | Freq: Two times a day (BID) | ORAL | 0 refills | Status: DC
  Filled 2021-01-04: qty 60, 30d supply, fill #0

## 2021-01-04 NOTE — TOC Initial Note (Addendum)
Transition of Care [facility]) - Initial/Assessment Note    Patient Details  Name: Hannah [name] MRN: [id number] Date of Birth: 01-02-1941  Transition of Care [facility] CM/SW Contact:    Zenon Mayo, RN Phone Number: 01/04/2021, 1:16 PM  Clinical Narrative:                 NCM spoke with patient, offered choice for HHPT, [place], she states she has has [place] in the past and would like them.  NCM made referral to [facility] with [facility], Awaiting to hear back.  Her copay for Delene Loll is 47.00 per benefit check. Ramond Marrow states they can not take referral, NCM made referral to [place] with [facility], she states they can take referral.  Soc will begin 24 to 48 hrs post dc.   Expected Discharge Plan: [place] Barriers to Discharge: No Barriers Identified   Patient Goals and CMS Choice Patient states their goals for this hospitalization and ongoing recovery are:: return home CMS Medicare.gov Compare Post Acute Care list provided to:: Patient Choice offered to / list presented to : Patient  Expected Discharge Plan and Services Expected Discharge Plan: [place] In-house Referral: NA Discharge Planning Services: CM Consult Post Acute Care Choice: [place] arrangements for the past 2 months: Single Family Home Expected Discharge Date: 01/04/21                 DME Agency: NA       HH Arranged: PT, OT HH Agency [facility] Date [place] Agency Contacted: 01/04/21 Time [place]: [id number] Representative contacted at agency, [place]  Prior Living Arrangements/Services Living arrangements for the past 2 months: [place] with:: Self Patient language and need for interpreter reviewed:: Yes Do you feel safe going back to the place where you live?: Yes      Need for Family Participation in Patient Care: Yes (Comment) Care giver support system in place?: Yes (comment)   Criminal Activity/Legal Involvement Pertinent to Current  Situation/Hospitalization: No - Comment as needed  Activities of Daily Living Home Assistive Devices/Equipment: CBG Meter, Blood pressure cuff ADL Screening (condition at time of admission) Patient's cognitive ability adequate to safely complete daily activities?: Yes Is the patient deaf or have difficulty hearing?: No Does the patient have difficulty seeing, even when wearing glasses/contacts?: No Does the patient have difficulty concentrating, remembering, or making decisions?: No Patient able to express need for assistance with ADLs?: Yes Does the patient have difficulty dressing or bathing?: No Independently performs ADLs?: Yes (appropriate for developmental age) Does the patient have difficulty walking or climbing stairs?: No Weakness of Legs: Both Weakness of Arms/Hands: None  Permission Sought/Granted                  Emotional Assessment Appearance:: Appears stated age Attitude/Demeanor/Rapport: Engaged Affect (typically observed): Appropriate Orientation: : Oriented to Self, Oriented to Place, Oriented to  Time, Oriented to Situation Alcohol / Substance Use: Not Applicable Psych Involvement: No (comment)  Admission diagnosis:  Elevated troponin [R77.8] Chest pain [R07.9] Chest pain, unspecified type [R07.9] COVID-19 virus infection [U07.1] Patient Active Problem List   Diagnosis Date Noted   Elevated troponin    Dilated cardiomyopathy ([place])    Ischemic cardiomyopathy    PAF (paroxysmal atrial fibrillation) ([place])    COVID-19 virus infection 12/25/2020   Non-ST elevation (NSTEMI) myocardial infarction ([place]) 12/25/2020   Thrombocytopenia ([place]) 04/23/2020   Acute GI bleeding 04/22/2020   Coronary artery calcification seen  on CT scan 06/24/2019   Hypertension 04/25/2019   Chest pain with elevated troponin  12/15/2018   Chronic kidney disease, stage 3a ([place]) 12/15/2018   Frequent falls 05/21/2018   Large bowel stricture ([place]) 12/15/2017   Chronic diastolic CHF  (congestive heart failure) ([place]) 03/15/2017   Anticoagulated on warfarin    Pulmonary hypertension ([place]) 07/26/2016   Neuropathy 02/12/2015   History of pulmonary embolism 06/10/2013   Hypothyroid 06/06/2013   Diabetes mellitus with neurological manifestations, controlled ([place]) 06/06/2013   Hyperlipidemia 02/25/2013   DM2 (diabetes mellitus, type 2) ([place]) 07/23/2012   PCP:  [name], MD Pharmacy:   [facility] ([place]), [place] - [address] [place] ([place]) [place] [address] Phone: [phone] Fax: [place] [address] [place] [address] Phone: [phone] Fax: [phone]     Social Determinants of Health (SDOH) Interventions    Readmission Risk Interventions Readmission Risk Prevention Plan 01/04/2021  Transportation Screening Complete  PCP or Specialist Appt within 5-7 Days (No Data)  Home Care Screening Complete  Medication Review (RN CM) Complete  Some recent data might be hidden

## 2021-01-04 NOTE — Progress Notes (Signed)
ANTICOAGULATION CONSULT NOTE - Follow Up Consult  Pharmacy Consult for Warfarin Indication: hx AF and PE  Allergies  Allergen Reactions   Augmentin [Amoxicillin-Pot Clavulanate] Itching and Other (See Comments)    Severe vaginal itching   Tranxene [Clorazepate] Itching   Crestor [Rosuvastatin]     Made her sick on stomach, she is able to tolerate the zocor   Penicillins Itching and Rash    Has patient had a PCN reaction causing immediate rash, facial/tongue/throat swelling, SOB or lightheadedness with hypotension: Yes Has patient had a PCN reaction causing severe rash involving mucus membranes or skin necrosis: No Has patient had a PCN reaction that required hospitalization: No Has patient had a PCN reaction occurring within the last 10 years: No If all of the above answers are "NO", then may proceed with Cephalosporin use.     Patient Measurements: Height: 5\' 7"  (170.2 cm) Weight: 76.4 kg (168 lb 6.9 oz) IBW/kg (Calculated) : 61.6  Vital Signs: Temp: 97.8 F (36.6 C) (01/02 0802) Temp Source: Oral (01/02 0802) BP: 119/66 (01/02 0802) Pulse Rate: 79 (01/02 0802)  Labs: Recent Labs    01/02/21 0600 01/02/21 1627 01/03/21 0448 01/04/21 0114  HGB 12.8  --  12.2 11.8*  HCT 40.8  --  39.6 38.0  PLT 212  --  190 245  LABPROT 18.1*  --  24.3* 24.0*  INR 1.5*  --  2.2* 2.2*  HEPARINUNFRC <0.10* 0.18* 0.83*  --   CREATININE 1.16*  --  1.22*  --     Estimated Creatinine Clearance: 39.8 mL/min (A) (by C-G formula based on SCr of 1.22 mg/dL (H)).   Assessment: Anticoag: Warfarin PTA for hx AF and PE (goal 2.5-3.5 per [facility] clinic notes, apparently had PE w/ INR 2.1 in past) - INR 2.2 again today. Hgb 11.8. Plts 245   Goal of Therapy:  INR 2.5-3.5 Monitor platelets by anticoagulation protocol: Yes   Plan:  Coumadin 7.5mg  po x 1 today. Daily INR   Luverne Farone S. [facility], PharmD, BCPS Clinical Staff Pharmacist [email or web address]  Merilynn Finland, Merilynn Finland 01/04/2021,10:19  AM

## 2021-01-04 NOTE — Progress Notes (Signed)
Occupational Therapy Treatment Patient Details Name: Hannah [name] MRN: [id number] DOB: September 24, 1940 Today's Date: 01/04/2021   History of present illness Pt is a 80 y.o. female admitted 12/25/20 with chest pain. Pt with (+)COVID, elevated troponins with ?myocarditis or pericarditis associated with COVID. Plan for LHC/RHC on 12/27. PMH includes PEA arrest from large PE (2014) on Coumadin, DM2, HTN, HLD, large bowel stricture-s/p colectomy with ostomy in place.   OT comments  Session limited by pt's report of sudden R chest wall pain "a few minutes" after MD left and reported plan for DC home today. Pt perseverating on this pain and feeling "sick", but unable to further describe these feelings when asked for details. Pt able to demo transfers with Min A at most but unable to progress OOB activities this AM. Continue to feel pt will manage well with family support at DC once pain subsides. DC recs remain appropriate.  HR <110bpm   Recommendations for follow up therapy are one component of a multi-disciplinary discharge planning process, led by the attending physician.  Recommendations may be updated based on patient status, additional functional criteria and insurance authorization.    Follow Up Recommendations  Home health OT    Assistance Recommended at Discharge Frequent or constant Supervision/Assistance  Equipment Recommendations  None recommended by OT    Recommendations for Other Services      Precautions / Restrictions Precautions Precautions: Fall;Other (comment) Precaution Comments: ostomy Restrictions Weight Bearing Restrictions: No       Mobility Bed Mobility Overal bed mobility: Modified Independent Bed Mobility: Supine to Sit;Sit to Supine     Supine to sit: Modified independent (Device/Increase time);HOB elevated Sit to supine: Modified independent (Device/Increase time)        Transfers Overall transfer level: Needs assistance Equipment used: Rolling walker (2  wheels) Transfers: Sit to/from Stand Sit to Stand: Min assist           General transfer comment: initially stood partially without assist then suddenly sat back on bedside. Min A provided to boost to full standing     Balance Overall balance assessment: Needs assistance Sitting-balance support: No upper extremity supported;Feet supported Sitting balance-Leahy Scale: Good     Standing balance support: Reliant on assistive device for balance;Bilateral upper extremity supported;No upper extremity supported Standing balance-Leahy Scale: Fair                             ADL either performed or assessed with clinical judgement   ADL Overall ADL's : Needs assistance/impaired                 Upper Body Dressing : Set up;Sitting             Toileting - Clothing Manipulation Details (indicate cue type and reason): Offered toileting or BSC assist after pt reporting need to urinate. pt opted to use purewick in bed instead       General ADL Comments: Limited by reports of chest wall pain and perseveration on this during session    Extremity/Trunk Assessment Upper Extremity Assessment Upper Extremity Assessment: Generalized weakness   Lower Extremity Assessment Lower Extremity Assessment: Defer to PT evaluation        Vision   Vision Assessment?: No apparent visual deficits   Perception     Praxis      Cognition Arousal/Alertness: Awake/alert Behavior During Therapy: WFL for tasks assessed/performed;Flat affect;Anxious Overall Cognitive Status: No family/caregiver present to determine baseline cognitive functioning  General Comments: perseveration on sudden chest wall pain this AM after MD in to tell pt about possible DC home today. Pt unable to describe this pain, reports just feeling "sick". Difficult to redirect with anxiety noted          Exercises     Shoulder Instructions       General  Comments HR <110bpm during session. Pt reports R chest wall pain that began "a few minutes" after MD left room and reported plan for DC home today. Notified RN via phone ([place] room) with reports of communicating with MD. Perseveration on pain limited session today. On OT exit, RN and cardiology going into room. RN reported pt denied any chest wall pain while cardiology in room immediately after OT session    Pertinent Vitals/ Pain       Pain Assessment: Faces Faces Pain Scale: Hurts even more Pain Location: R chest wall Pain Descriptors / Indicators: Grimacing;Guarding Pain Intervention(s): Monitored during session;Limited activity within patient's tolerance;Other (comment) (notified RN)  Home Living                                          Prior Functioning/Environment              Frequency  Min 2X/week        Progress Toward Goals  OT Goals(current goals can now be found in the care plan section)  Progress towards OT goals: OT to reassess next treatment  Acute Rehab OT Goals Patient Stated Goal: go home to see grandson that is in army before he leaves town OT Goal Formulation: With patient Time For Goal Achievement: 01/09/21 Potential to Achieve Goals: Fair ADL Goals Pt Will Perform Lower Body Bathing: with min guard assist;sitting/lateral leans;sit to/from stand Pt Will Perform Lower Body Dressing: with min guard assist;sitting/lateral leans;sit to/from stand Pt Will Transfer to Toilet: with min assist;ambulating Pt Will Perform Toileting - Clothing Manipulation and hygiene: with min guard assist;sitting/lateral leans;sit to/from stand Additional ADL Goal #1: Pt will complete bed mobility with min guard as a precursor to seated ADL's. Additional ADL Goal #2: Pt will sit EOB with no assist for 3 mins as a precursor to seated tasks.  Plan Discharge plan needs to be updated    Co-evaluation                 AM-PAC OT "6 Clicks" Daily Activity      Outcome Measure   Help from another person eating meals?: A Little Help from another person taking care of personal grooming?: A Little Help from another person toileting, which includes using toliet, bedpan, or urinal?: A Little Help from another person bathing (including washing, rinsing, drying)?: A Little Help from another person to put on and taking off regular upper body clothing?: A Little Help from another person to put on and taking off regular lower body clothing?: A Little 6 Click Score: 18    End of Session Equipment Utilized During Treatment: Rolling walker (2 wheels)  OT Visit Diagnosis: Unsteadiness on feet (R26.81);Other abnormalities of gait and mobility (R26.89);Muscle weakness (generalized) (M62.81)   Activity Tolerance Patient limited by pain   Patient Left in bed;with call bell/phone within reach;with bed alarm set   Nurse Communication Mobility status;Other (comment) (pain)        Time: [id number] OT Time Calculation (min): 23 min  Charges: OT General Charges $OT  Visit: 1 Visit OT Treatments $Self Care/Home Management : 8-22 mins $Therapeutic Activity: 8-22 mins  Malachy Chamber, OTR/L Acute Rehab Services Office: [phone]   Hannah [name] 01/04/2021, 12:09 PM

## 2021-01-04 NOTE — Discharge Summary (Signed)
Hannah [name] FIE:[id number] DOB: 17-Sep-1940 DOA: 12/25/2020  PCP: [name], MD  Admit date: 12/25/2020  Discharge date: 01/04/2021  Admitted From: Home   Disposition:  Home   Recommendations for Outpatient Follow-up:   Follow up with PCP in 2 to 3 days to check your INR/Coumadin level.  Home Health: Home health PT Equipment/Devices: None Consultations: Cardiology Discharge Condition: Improved CODE STATUS: Full Diet Recommendation: Heart Healthy low-sodium carb modified  Diet Order             Diet - low sodium heart healthy           Diet Carb Modified           Diet Carb Modified Fluid consistency: Thin; Room service appropriate? Yes  Diet effective now                    Chief Complaint  Patient presents with   Hypertension     Brief history of present illness from the day of admission and additional interim summary     Patient is a 81 y.o. female with history of PEA arrest due to a large PE in 2014-on anticoagulation with Coumadin, DM-2, HTN, HLD, large bowel stricture-s/p colectomy with ostomy in place-presented to the hospital with chest pain.  Patient was noted to have mildly elevated troponin and was also noted to be COVID-positive in the ED.  She had no oxygen requirement and was breathing comfortably on room air.                                                                 Hospital Course   Echocardiogram revealed suppressed EF with numerous wall motion abnormalities.  Differential diagnosis was ischemic cardiomyopathy versus COVID-19 associated myopericarditis.  Left heart cath was attempted on 1228 however was aborted due to access issues.  LHC done 01/01/2021 showed no obstructive CAD.  She was diagnosed with nonischemic cardiomyopathy with an EF of 30% thought to be most  likely secondary to hypertension.  Patient was followed closely by cardiology and her cardiac and heart failure meds were optimized.  She tolerated diuresis and medication management well.  Patient was treated with 5 days of remdesivir and remained asymptomatic from a COVID standpoint with no development of oxygen requirement.  Newly diagnosed HFrEF/nonischemic cardiomyopathy EF 30% with SWMA LHC done 01/01/2021 revealed no obstructive CAD. She has diuresed well and volume status is at goal Continue metoprolol and Farxiga Patient was initiated on Entresto and is tolerating well, creatinine is at baseline and BP is stable. Patient to follow-up with cardiology transitions of care clinic.   COVID-19 infection No respiratory symptoms, no hypoxia, chest x-ray negative for pneumonia. Status post treatment with remdesivir. Patient was diagnosed on 10/25/2020 and is now 10 days postdiagnosis so no longer  has to quarantine.   Paroxysmal atrial fibrillation Presently back in sinus, there was some discussion of initiating amiodarone, per cardiology Restarted back on Coumadin Continue Toprol-XL   HTN Has been low in the past Continue Toprol-XL Will need close outpatient follow-up given initiation of Entresto Lisinopril has been discontinued   CKD 3 AA Creatinine is stable   DM2 Continue metformin and Farxiga   Discharge diagnosis     Principal Problem:   Chest pain with elevated troponin  Active Problems:   DM2 (diabetes mellitus, type 2) (HCC)   Hyperlipidemia   Hypothyroid   History of pulmonary embolism   Chronic diastolic CHF (congestive heart failure) (HCC)   Chronic kidney disease, stage 3a (HCC)   Hypertension   COVID-19 virus infection   Non-ST elevation (NSTEMI) myocardial infarction (HCC)   Ischemic cardiomyopathy   PAF (paroxysmal atrial fibrillation) (HCC)   Dilated cardiomyopathy (HCC)   Elevated troponin    Discharge instructions    Discharge Instructions      (HEART FAILURE PATIENTS) Call MD:  Anytime you have any of the following symptoms: 1) 3 pound weight gain in 24 hours or 5 pounds in 1 week 2) shortness of breath, with or without a dry hacking cough 3) swelling in the hands, feet or stomach 4) if you have to sleep on extra pillows at night in order to breathe.   Complete by: As directed    Diet - low sodium heart healthy   Complete by: As directed    Diet Carb Modified   Complete by: As directed    Discharge instructions   Complete by: As directed    1.  Please get your INR/Coumadin level checked within 2 to 3 days after discharge.  Make sure you continue to monitor your Coumadin as you were doing before you came to the hospital. 2.  The cardiologist will make an appointment for you to follow-up with them in the near future. 3.  You are now 10 days from your COVID diagnosis so you no longer need to stay quarantined.   Increase activity slowly   Complete by: As directed        Discharge Medications   Allergies as of 01/04/2021       Reactions   Augmentin [amoxicillin-pot Clavulanate] Itching, Other (See Comments)   Severe vaginal itching   Tranxene [clorazepate] Itching   Crestor [rosuvastatin]    Made her sick on stomach, she is able to tolerate the zocor   Penicillins Itching, Rash   Has patient had a PCN reaction causing immediate rash, facial/tongue/throat swelling, SOB or lightheadedness with hypotension: Yes Has patient had a PCN reaction causing severe rash involving mucus membranes or skin necrosis: No Has patient had a PCN reaction that required hospitalization: No Has patient had a PCN reaction occurring within the last 10 years: No If all of the above answers are "NO", then may proceed with Cephalosporin use.        Medication List     STOP taking these medications    lisinopril 5 MG tablet Commonly known as: ZESTRIL   metoprolol tartrate 25 MG tablet Commonly known as: LOPRESSOR   simvastatin 20 MG  tablet Commonly known as: ZOCOR       TAKE these medications    acetaminophen 500 MG tablet Commonly known as: TYLENOL Take 500 mg by mouth every 6 (six) hours as needed for mild pain.   atorvastatin 40 MG tablet Commonly known as: LIPITOR Take 1 tablet (40  mg total) by mouth daily. Start taking on: January 05, 2021   dapagliflozin propanediol 10 MG Tabs tablet Commonly known as: FARXIGA Take 1 tablet (10 mg total) by mouth daily. Start taking on: January 05, 2021   diclofenac Sodium 1 % Gel Commonly known as: Voltaren Apply 4 g topically 4 (four) times daily. What changed:  when to take this reasons to take this   Entresto 24-26 MG Generic drug: sacubitril-valsartan Take 1 tablet by mouth 2 (two) times daily.   sacubitril-valsartan 24-26 MG Commonly known as: ENTRESTO Take 1 tablet by mouth 2 (two) times daily.   furosemide 20 MG tablet Commonly known as: LASIX Take 1 tablet (20 mg total) by mouth daily as needed for fluid or edema.   levothyroxine 25 MCG tablet Commonly known as: Euthyrox Take 1 tablet (25 mcg total) by mouth daily before breakfast.   metFORMIN 500 MG 24 hr tablet Commonly known as: GLUCOPHAGE-XR TAKE 1 TABLET BY MOUTH ONCE DAILY WITH BREAKFAST. PATIENT NEEDS AN APPT.   metoprolol succinate 25 MG 24 hr tablet Commonly known as: TOPROL-XL Take 1.5 tablets (37.5 mg total) by mouth every evening.   OneTouch Delica Lancets 65H Misc USE 1  TO CHECK GLUCOSE ONCE DAILY   OneTouch Verio test strip Generic drug: glucose blood USE 1 STRIP TO CHECK GLUCOSE ONCE DAILY AS DIRECTED   OneTouch Verio w/Device Kit 1 each by Does not apply route as directed.   pantoprazole 40 MG tablet Commonly known as: PROTONIX Take 1 tablet (40 mg total) by mouth daily.   vitamin B-12 1000 MCG tablet Commonly known as: CYANOCOBALAMIN Take 1 tablet (1,000 mcg total) by mouth daily.   vitamin C 500 MG tablet Commonly known as: ASCORBIC ACID Take 500 mg by mouth  daily.   warfarin 5 MG tablet Commonly known as: COUMADIN Take as directed. If you are unsure how to take this medication, talk to your nurse or doctor. Original instructions: TAKE 1/2 TO 1 (ONE-HALF TO ONE) TABLET BY MOUTH DAILY AS DIRECTED BY  COUMADIN  CLINIC What changed:  how much to take how to take this when to take this additional instructions          Major procedures and Radiology Reports - PLEASE review detailed and final reports thoroughly  -      CARDIAC CATHETERIZATION  Result Date: 01/01/2021   Prox RCA to Mid RCA lesion is 20% stenosed. Mild plaque in the mid segment of the large, dominant RCA No obstructive disease in the LAD or Circumflex Normal LV filling pressure. Recommendations: Her cardiomyopathy is non-ischemic. Consider her uncontrolled HTN as a cause of her cardiomyopathy (SBP 220 mmHg on arrival to the cath lab earlier this week and SBP of 195 on arrival to the cath lab today). Titrate beta blocker. Add additional medical therapy for better BP control.   CARDIAC CATHETERIZATION  Result Date: 12/30/2020 Normal right heart pressures Unsuccessful attempt at left heart cath from the right radial artery due to radial artery loop. Recommendations: Will plan to bring her back for left heart catheterization from the right femoral artery approach tomorrow if her INR is less than 1.7. Given her elevated INR today, I did not feel that it was safe to proceed with femoral artery access today. Her right radial artery should not be used for access for future cardiac caths. NPO at midnight. Will not resume IV heparin tonight since we did obtain access into the right femoral vein today.   DG Chest [facility]  1 View  Result Date: 12/27/2020 CLINICAL DATA:  Shortness of breath and nausea, COVID positive. EXAM: PORTABLE CHEST 1 VIEW COMPARISON:  12/25/2020. FINDINGS: The heart size and mediastinal contours are within normal limits. Atherosclerotic calcification of the aorta is  noted. No consolidation, effusion, or pneumothorax. No acute osseous abnormality. IMPRESSION: No acute cardiopulmonary process. Electronically Signed   By: Brett Fairy M.D.   On: 12/27/2020 04:35   DG Chest Port 1 View  Result Date: 12/25/2020 CLINICAL DATA:  Shortness of breath EXAM: PORTABLE CHEST 1 VIEW COMPARISON:  Chest x-ray 12/14/2018 FINDINGS: Heart size and mediastinal contours are within normal limits. No suspicious pulmonary opacities identified. Elevated right hemidiaphragm. No pleural effusion or pneumothorax visualized. No acute osseous abnormality appreciated. IMPRESSION: No acute intrathoracic process identified. Electronically Signed   By: Ofilia Neas M.D.   On: 12/25/2020 12:58   ECHOCARDIOGRAM COMPLETE  Result Date: 12/26/2020    ECHOCARDIOGRAM REPORT   Patient Name:   Hannah [name] Date of Exam: 12/26/2020 Medical Rec #:  [id number]    Height:       67.0 in Accession #:    [id number]   Weight:       168.4 lb Date of Birth:  1940/10/16    BSA:          1.880 m Patient Age:    3 years     BP:           110/56 mmHg Patient Gender: F            HR:           82 bpm. Exam Location:  Inpatient Procedure: 2D Echo, Cardiac Doppler and Color Doppler Indications:    Chest pain  History:        Patient has no prior history of Echocardiogram examinations,                 most recent 04/23/2020. Risk Factors:Hypertension, Diabetes and                 Dyslipidemia. COVID-19. Hx pulmonary embolus.  Sonographer:    Clayton Lefort RDCS (AE) Referring Phys: [id number] [place]  1. Left ventricular ejection fraction, by estimation, is 30 to 35%. The left ventricle has moderately decreased function. The left ventricle demonstrates regional wall motion abnormalities (see scoring diagram/findings for description). There is mild left ventricular hypertrophy. Left ventricular diastolic parameters are consistent with Grade I diastolic dysfunction (impaired relaxation). Elevated left ventricular  end-diastolic pressure.  2. Right ventricular systolic function is normal. The right ventricular size is normal. There is normal pulmonary artery systolic pressure. The estimated right ventricular systolic pressure is 17.5 mmHg.  3. A small pericardial effusion is present. The pericardial effusion is anterior to the right ventricle.  4. The mitral valve is grossly normal. Mild mitral valve regurgitation. Moderate mitral annular calcification.  5. Tricuspid valve regurgitation is moderate.  6. The aortic valve is tricuspid. There is mild calcification of the aortic valve. Aortic valve regurgitation is not visualized. Aortic valve sclerosis/calcification is present, without any evidence of aortic stenosis. Aortic valve mean gradient measures 2.0 mmHg. Comparison(s): Prior images reviewed side by side. LV dysfunction and wall motion abnormalities are new in comparison. FINDINGS  Left Ventricle: Left ventricular ejection fraction, by estimation, is 30 to 35%. The left ventricle has moderately decreased function. The left ventricle demonstrates regional wall motion abnormalities. The left ventricular internal cavity size was normal in size. There is mild left ventricular hypertrophy. Left  ventricular diastolic parameters are consistent with Grade I diastolic dysfunction (impaired relaxation). Elevated left ventricular end-diastolic pressure.  LV Wall Scoring: The mid and distal anterior septum, entire apex, and mid inferoseptal segment are akinetic. The mid anterolateral segment, mid anterior segment, and mid inferior segment are hypokinetic. The posterior wall, basal anteroseptal segment, basal anterolateral segment, basal anterior segment, basal inferior segment, and basal inferoseptal segment are normal. Right Ventricle: The right ventricular size is normal. No increase in right ventricular wall thickness. Right ventricular systolic function is normal. There is normal pulmonary artery systolic pressure. The tricuspid  regurgitant velocity is 2.46 m/s, and  with an assumed right atrial pressure of 3 mmHg, the estimated right ventricular systolic pressure is 59.9 mmHg. Left Atrium: Left atrial size was normal in size. Right Atrium: Right atrial size was normal in size. Pericardium: A small pericardial effusion is present. The pericardial effusion is anterior to the right ventricle. Mitral Valve: The mitral valve is grossly normal. Moderate mitral annular calcification. Mild mitral valve regurgitation. MV peak gradient, 3.6 mmHg. The mean mitral valve gradient is 1.0 mmHg. Tricuspid Valve: The tricuspid valve is grossly normal. Tricuspid valve regurgitation is moderate. Aortic Valve: The aortic valve is tricuspid. There is mild calcification of the aortic valve. There is mild aortic valve annular calcification. Aortic valve regurgitation is not visualized. Aortic valve sclerosis/calcification is present, without any evidence of aortic stenosis. Aortic valve mean gradient measures 2.0 mmHg. Aortic valve peak gradient measures 4.5 mmHg. Aortic valve area, by VTI measures 2.38 cm. Pulmonic Valve: The pulmonic valve was grossly normal. Pulmonic valve regurgitation is trivial. Aorta: The aortic root is normal in size and structure. IAS/Shunts: No atrial level shunt detected by color flow Doppler.  LEFT VENTRICLE PLAX 2D LVIDd:         3.80 cm   Diastology LVIDs:         2.30 cm   LV e' medial:    3.48 cm/s LV PW:         1.30 cm   LV E/e' medial:  14.4 LV IVS:        1.30 cm   LV e' lateral:   4.79 cm/s LVOT diam:     2.00 cm   LV E/e' lateral: 10.5 LV SV:         43 LV SV Index:   23 LVOT Area:     3.14 cm  RIGHT VENTRICLE             IVC RV Basal diam:  3.10 cm     IVC diam: 0.90 cm RV S prime:     19.10 cm/s TAPSE (M-mode): 1.9 cm LEFT ATRIUM             Index        RIGHT ATRIUM           Index LA diam:        2.90 cm 1.54 cm/m   RA Area:     10.30 cm LA Vol (A2C):   33.0 ml 17.55 ml/m  RA Volume:   20.60 ml  10.96 ml/m LA Vol  (A4C):   33.5 ml 17.82 ml/m LA Biplane Vol: 34.3 ml 18.25 ml/m  AORTIC VALVE AV Area (Vmax):    2.59 cm AV Area (Vmean):   2.45 cm AV Area (VTI):     2.38 cm AV Vmax:           106.00 cm/s AV Vmean:  69.600 cm/s AV VTI:            0.181 m AV Peak Grad:      4.5 mmHg AV Mean Grad:      2.0 mmHg LVOT Vmax:         87.40 cm/s LVOT Vmean:        54.200 cm/s LVOT VTI:          0.137 m LVOT/AV VTI ratio: 0.76  AORTA Ao Root diam: 2.80 cm Ao Asc diam:  2.10 cm MITRAL VALVE               TRICUSPID VALVE MV Area (PHT): 2.97 cm    TR Peak grad:   24.2 mmHg MV Area VTI:   2.36 cm    TR Vmax:        246.00 cm/s MV Peak grad:  3.6 mmHg MV Mean grad:  1.0 mmHg    SHUNTS MV Vmax:       0.95 m/s    Systemic VTI:  0.14 m MV Vmean:      51.9 cm/s   Systemic Diam: 2.00 cm MV Decel Time: 255 msec MV E velocity: 50.20 cm/s MV A velocity: 78.80 cm/s MV E/A ratio:  0.64 Rozann Lesches MD Electronically signed by Rozann Lesches MD Signature Date/Time: 12/26/2020/4:20:16 PM    Final     Micro Results   Recent Results (from the past 240 hour(s))  MRSA Next Gen by PCR, Nasal     Status: None   Collection Time: 12/26/20 12:20 AM   Specimen: Nasal Mucosa; Nasal Swab  Result Value Ref Range Status   MRSA by PCR Next Gen NOT DETECTED NOT DETECTED Final    Comment: (NOTE) The GeneXpert MRSA Assay (FDA approved for NASAL specimens only), is one component of a comprehensive MRSA colonization surveillance program. It is not intended to diagnose MRSA infection nor to guide or monitor treatment for MRSA infections. Test performance is not FDA approved in patients less than 11 years old. Performed at [facility], [place] [address] [place], [place] [address]   Culture, blood (routine x 2)     Status: None   Collection Time: 12/26/20  2:29 PM   Specimen: BLOOD LEFT HAND  Result Value Ref Range Status   Specimen Description BLOOD LEFT HAND  Final   Special Requests   Final    BOTTLES DRAWN AEROBIC AND  ANAEROBIC Blood Culture results may not be optimal due to an inadequate volume of blood received in culture bottles   Culture   Final    NO GROWTH 5 DAYS Performed at [facility], [place] [address], [place], [place] [address]    Report Status 12/31/2020 FINAL  Final  Culture, blood (routine x 2)     Status: None   Collection Time: 12/26/20  2:40 PM   Specimen: BLOOD LEFT WRIST  Result Value Ref Range Status   Specimen Description BLOOD LEFT WRIST  Final   Special Requests   Final    BOTTLES DRAWN AEROBIC ONLY Blood Culture results may not be optimal due to an inadequate volume of blood received in culture bottles   Culture   Final    NO GROWTH 5 DAYS Performed at [facility], [place] [address], [place], [place] [address]    Report Status 12/31/2020 FINAL  Final    Today   Subjective    Hannah [name] feels much improved since admission.  Feels ready to go home.  Denies chest pain, shortness of breath or abdominal pain.  Feels they can take care of themselves with the resources they have at home.  Objective   Blood pressure 110/61, pulse 80, temperature 98.1 F (36.7 C), temperature source Oral, resp. rate 19, height _0  (1.702 m), weight 76.4 kg, SpO2 99 %.   Intake/Output Summary (Last 24 hours) at 01/04/2021 1246 Last data filed at 01/04/2021 0800 Gross per 24 hour  Intake 340 ml  Output 1050 ml  Net -710 ml    Exam General: Patient appears well and in good spirits sitting up in bed in no acute distress.  Eyes: sclera anicteric, conjuctiva mild injection bilaterally CVS: S1-S2, regular  Respiratory:  decreased air entry bilaterally secondary to decreased inspiratory effort, rales at bases  GI: NABS, soft, NT  LE: No edema.  Neuro: A/O x 3, Moving all extremities equally with normal strength, CN 3-12 intact, grossly nonfocal.  Psych: patient is logical and coherent, judgement and insight appear normal, mood and affect appropriate to situation.    Data Review    CBC w Diff:  Lab Results  Component Value Date   WBC 7.9 01/04/2021   HGB 11.8 (L) 01/04/2021   HCT 38.0 01/04/2021   PLT 245 01/04/2021   LYMPHOPCT 13 12/25/2020   MONOPCT 6 12/25/2020   EOSPCT 2 12/25/2020   BASOPCT 0 12/25/2020    CMP:  Lab Results  Component Value Date   NA 137 01/03/2021   K 4.4 01/03/2021   CL 107 01/03/2021   CO2 19 (L) 01/03/2021   BUN 16 01/03/2021   CREATININE 1.22 (H) 01/03/2021   CREATININE 1.32 (H) 12/03/2019   PROT 5.8 (L) 12/28/2020   ALBUMIN 2.6 (L) 12/28/2020   BILITOT 0.4 12/28/2020   ALKPHOS 42 12/28/2020   AST 30 12/28/2020   ALT 13 12/28/2020  .   Total Time in preparing paper work, data evaluation and todays exam - 35 minutes  Vashti Hey M.D on 01/04/2021 at 12:46 PM  [facility]

## 2021-01-04 NOTE — TOC Benefit Eligibility Note (Signed)
Patient Advocate Encounter ° °Insurance verification completed.   ° °The patient is currently admitted and upon discharge could be taking Entresto 24-26 mg. ° °The current 30 day co-pay is, $47.00.  ° °The patient is insured through [employer] Medicare Part D  ° ° ° °Ivo Moga, CPhT °Pharmacy Patient Advocate Specialist °[facility] Patient Advocate Team °Direct Number: [phone]  Fax: [phone] ° ° ° ° ° °  °

## 2021-01-04 NOTE — Discharge Instructions (Signed)

## 2021-01-04 NOTE — Progress Notes (Signed)
Progress Note   Subjective   Had episode of 1x right sided chest all discomfort that abruptly resolved. No new ST changes on tele.  Inpatient Medications    Scheduled Meds:  vitamin C  500 mg Oral Daily   atorvastatin  40 mg Oral Daily   dapagliflozin propanediol  10 mg Oral Daily   insulin aspart  0-9 Units Subcutaneous TID WC   levothyroxine  25 mcg Oral QAC breakfast   metoprolol succinate  37.5 mg Oral QPM   pantoprazole  40 mg Oral Daily   sacubitril-valsartan  1 tablet Oral BID   sodium chloride flush  3 mL Intravenous Q12H   sodium chloride flush  3 mL Intravenous Q12H   sodium chloride flush  3 mL Intravenous Q12H   warfarin  7.5 mg Oral ONCE-1600   Warfarin - Pharmacist Dosing Inpatient   Does not apply q1600   zinc sulfate  220 mg Oral Daily   Continuous Infusions:  sodium chloride     sodium chloride     sodium chloride     PRN Meds: sodium chloride, sodium chloride, sodium chloride, acetaminophen, albuterol, guaiFENesin-dextromethorphan, ondansetron **OR** ondansetron (ZOFRAN) IV, sodium chloride flush, sodium chloride flush, sodium chloride flush   Vital Signs    Vitals:   01/03/21 2109 01/04/21 0023 01/04/21 0400 01/04/21 0802  BP: 95/64 118/65 116/73 119/66  Pulse: 86 81 81 79  Resp: 18 15 12 17   Temp: 98 F (36.7 C) 98.6 F (37 C) 98.1 F (36.7 C) 97.8 F (36.6 C)  TempSrc: Oral Oral Oral Oral  SpO2: 97% 98% 96% 99%  Weight:      Height:        Intake/Output Summary (Last 24 hours) at 01/04/2021 1027 Last data filed at 01/04/2021 0800 Gross per 24 hour  Intake 580 ml  Output 1050 ml  Net -470 ml   Filed Weights   12/25/20 1154 12/25/20 1910 12/31/20 0627  Weight: 79.4 kg 76.4 kg 76.4 kg    Telemetry    Sinus rhythm- Personally Reviewed  Physical Exam   Vitals:   01/04/21 0400 01/04/21 0802  BP: 116/73 119/66  Pulse: 81 79  Resp: 12 17  Temp: 98.1 F (36.7 C) 97.8 F (36.6 C)  SpO2: 96% 99%   Physical Exam Gen:  comfortable Neuro: alert and oriented. ?mild cognitive impairment vs dementia CV: r,r,r no murmurs. No JVD Vasc: 2+ radial pulses Pulm: CLAB Abd: non distended Ext: No LE edema Skin: warm and well perfused Psych: normal mood    Labs    Chemistry Recent Labs  Lab 01/01/21 0114 01/02/21 0600 01/03/21 0448  NA 136 137 137  K 3.9 4.3 4.4  CL 109 110 107  CO2 18* 17* 19*  GLUCOSE 100* 112* 118*  BUN 22 19 16   CREATININE 1.23* 1.16* 1.22*  CALCIUM 8.2* 8.7* 8.5*  GFRNONAA NOT CALCULATED 48* 45*  ANIONGAP 9 10 11      Hematology Recent Labs  Lab 01/02/21 0600 01/03/21 0448 01/04/21 0114  WBC 7.8 7.9 7.9  RBC 5.20* 5.10 4.91  HGB 12.8 12.2 11.8*  HCT 40.8 39.6 38.0  MCV 78.5* 77.6* 77.4*  MCH 24.6* 23.9* 24.0*  MCHC 31.4 30.8 31.1  RDW 20.4* 20.1* 20.1*  PLT 212 190 245     Patient ID  81 y.o. female with history of PEA arrest due to large PE in 2014 on chronic warfarin, HTN, HLD, DMII, large bowel stricture s/p colectomy with ostomy in place, and  CKD IIIB who presented to the hospital with COVID. TTE with EF 30-35% with multiple WMA. Cath without obstructive CAD.  Assessment & Plan    Chest pain: abrupt right sided chest wall pain unlikely cardiac. From a cardiology standpoint, she does not need to stay in the hospital if she is clinically ready for discharge. She has coumadin clinic appt 01/11/2021. Work on [employer] appointment.  1.  Nonischemic CM Doing well with medical therapy -Entresto started 01/03/2021; Continue to titrate entresto as able - stop home lisinopril -Can consider starting low dose spironolactone 12.5 mg for GDMT Daily weights  Sodium restriction Start lasix 20mg  po daily at discharge with close transitions of care follow-up. -continue simvastatin -BMET in 2 weeks   2. Paroxysmal atrial fibrillation/ atrial flutter Now in sinus Therapeutic INR Stop heparin Continue coumadin Stop ASA I would not advise antiarrhythmic medicine at this  time  3. CKI Stable No change required today  4. Prior DVT/PTE On coumadin  5. Covid + Per primary team  Janina Mayo, MD, MS 01/04/21

## 2021-01-05 ENCOUNTER — Telehealth: Payer: Self-pay

## 2021-01-05 ENCOUNTER — Encounter (HOSPITAL_COMMUNITY): Payer: Self-pay | Admitting: Cardiovascular Disease

## 2021-01-05 NOTE — Telephone Encounter (Signed)
Transition Care Management Unsuccessful Follow-up Telephone Call  Date of discharge and from where:  TCM DC [facility] 01-04-21 Dx: elevated troponin/COVID  Attempts:  1st Attempt  Reason for unsuccessful TCM follow-up call:  Left voice message

## 2021-01-06 NOTE — Telephone Encounter (Signed)
Transition Care Management Follow-up Telephone Call Date of discharge and from where: [facility] 01-04-21 Dx: elevated troponin/COVID How have you been since you were released from the hospital? Feeling better  Any questions or concerns? No  Items Reviewed: Did the pt receive and understand the discharge instructions provided? Yes  Medications obtained and verified? Yes  Other? No  Any new allergies since your discharge? No  Dietary orders reviewed? Yes Do you have support at home? Yes   Home Care and Equipment/Supplies: Were home health services ordered? Yes PT If so, what is the name of the agency? [facility]   Has the agency set up a time to come to the patient's home? yes Were any new equipment or medical supplies ordered?  No What is the name of the medical supply agency? na Were you able to get the supplies/equipment? not applicable Do you have any questions related to the use of the equipment or supplies? No  Functional Questionnaire: (I = Independent and D = Dependent) ADLs: I  Bathing/Dressing- D  Meal Prep- D  Eating- I  Maintaining continence- I  Transferring/Ambulation- D  Managing Meds- I  Follow up appointments reviewed:  PCP Hospital f/u appt confirmed? Yes  Scheduled to see Dr Ethlyn Gallery on 01-13-21 @ 1130am. [facility] f/u appt confirmed? No . Are transportation arrangements needed? No  If their condition worsens, is the pt aware to call PCP or go to the Emergency Dept.? Yes Was the patient provided with contact information for the PCP's office or ED? Yes Was to pt encouraged to call back with questions or concerns? Yes

## 2021-01-07 ENCOUNTER — Other Ambulatory Visit: Payer: Self-pay

## 2021-01-07 ENCOUNTER — Ambulatory Visit: Payer: Medicare Other | Admitting: *Deleted

## 2021-01-07 DIAGNOSIS — I2699 Other pulmonary embolism without acute cor pulmonale: Secondary | ICD-10-CM | POA: Diagnosis not present

## 2021-01-07 DIAGNOSIS — Z5181 Encounter for therapeutic drug level monitoring: Secondary | ICD-10-CM

## 2021-01-07 DIAGNOSIS — Z7901 Long term (current) use of anticoagulants: Secondary | ICD-10-CM | POA: Diagnosis not present

## 2021-01-07 LAB — POCT INR: INR: 2.2 (ref 2.0–3.0)

## 2021-01-07 NOTE — Patient Instructions (Signed)
Description   Today take 1 tablet then continue taking warfarin 1/2 tablet daily except for 1 tablet on Mondays, Wednesdays and Fridays. Keep a green leafy veggie in your diet 2-3 times a week. Recheck INR in 5 days with [facility].  [facility] [phone].

## 2021-01-08 ENCOUNTER — Emergency Department (HOSPITAL_COMMUNITY)
Admission: EM | Admit: 2021-01-08 | Discharge: 2021-01-08 | Disposition: A | Discharge status: Home / Self Care | Payer: Medicare Other | Attending: Emergency Medicine | Admitting: Emergency Medicine

## 2021-01-08 ENCOUNTER — Encounter (HOSPITAL_COMMUNITY): Payer: Self-pay

## 2021-01-08 ENCOUNTER — Emergency Department (HOSPITAL_COMMUNITY): Payer: Medicare Other

## 2021-01-08 DIAGNOSIS — E1122 Type 2 diabetes mellitus with diabetic chronic kidney disease: Secondary | ICD-10-CM | POA: Diagnosis not present

## 2021-01-08 DIAGNOSIS — I130 Hypertensive heart and chronic kidney disease with heart failure and stage 1 through stage 4 chronic kidney disease, or unspecified chronic kidney disease: Secondary | ICD-10-CM | POA: Diagnosis not present

## 2021-01-08 DIAGNOSIS — U071 COVID-19: Secondary | ICD-10-CM | POA: Diagnosis not present

## 2021-01-08 DIAGNOSIS — R0902 Hypoxemia: Secondary | ICD-10-CM | POA: Diagnosis not present

## 2021-01-08 DIAGNOSIS — R6889 Other general symptoms and signs: Secondary | ICD-10-CM | POA: Diagnosis not present

## 2021-01-08 DIAGNOSIS — Z79899 Other long term (current) drug therapy: Secondary | ICD-10-CM | POA: Insufficient documentation

## 2021-01-08 DIAGNOSIS — R0789 Other chest pain: Secondary | ICD-10-CM | POA: Diagnosis not present

## 2021-01-08 DIAGNOSIS — Z7901 Long term (current) use of anticoagulants: Secondary | ICD-10-CM | POA: Insufficient documentation

## 2021-01-08 DIAGNOSIS — N183 Chronic kidney disease, stage 3 unspecified: Secondary | ICD-10-CM | POA: Insufficient documentation

## 2021-01-08 DIAGNOSIS — R112 Nausea with vomiting, unspecified: Secondary | ICD-10-CM | POA: Diagnosis not present

## 2021-01-08 DIAGNOSIS — Z7984 Long term (current) use of oral hypoglycemic drugs: Secondary | ICD-10-CM | POA: Diagnosis not present

## 2021-01-08 DIAGNOSIS — I5022 Chronic systolic (congestive) heart failure: Secondary | ICD-10-CM | POA: Insufficient documentation

## 2021-01-08 DIAGNOSIS — N644 Mastodynia: Secondary | ICD-10-CM | POA: Diagnosis not present

## 2021-01-08 DIAGNOSIS — Z743 Need for continuous supervision: Secondary | ICD-10-CM | POA: Diagnosis not present

## 2021-01-08 DIAGNOSIS — I110 Hypertensive heart disease with heart failure: Secondary | ICD-10-CM | POA: Diagnosis not present

## 2021-01-08 DIAGNOSIS — R079 Chest pain, unspecified: Secondary | ICD-10-CM

## 2021-01-08 DIAGNOSIS — I509 Heart failure, unspecified: Secondary | ICD-10-CM | POA: Diagnosis not present

## 2021-01-08 LAB — CBC WITH DIFFERENTIAL/PLATELET
Abs Immature Granulocytes: 0.04 10*3/uL (ref 0.00–0.07)
Basophils Absolute: 0.1 10*3/uL (ref 0.0–0.1)
Basophils Relative: 1 %
Eosinophils Absolute: 0.1 10*3/uL (ref 0.0–0.5)
Eosinophils Relative: 1 %
HCT: 39.6 % (ref 36.0–46.0)
Hemoglobin: 12.1 g/dL (ref 12.0–15.0)
Immature Granulocytes: 1 %
Lymphocytes Relative: 22 %
Lymphs Abs: 2 10*3/uL (ref 0.7–4.0)
MCH: 24.6 pg — ABNORMAL LOW (ref 26.0–34.0)
MCHC: 30.6 g/dL (ref 30.0–36.0)
MCV: 80.7 fL (ref 80.0–100.0)
Monocytes Absolute: 0.5 10*3/uL (ref 0.1–1.0)
Monocytes Relative: 6 %
Neutro Abs: 6.2 10*3/uL (ref 1.7–7.7)
Neutrophils Relative %: 69 %
Platelets: 283 10*3/uL (ref 150–400)
RBC: 4.91 MIL/uL (ref 3.87–5.11)
RDW: 20.8 % — ABNORMAL HIGH (ref 11.5–15.5)
WBC: 8.8 10*3/uL (ref 4.0–10.5)
nRBC: 0 % (ref 0.0–0.2)

## 2021-01-08 LAB — COMPREHENSIVE METABOLIC PANEL
ALT: 13 U/L (ref 0–44)
AST: 18 U/L (ref 15–41)
Albumin: 2.8 g/dL — ABNORMAL LOW (ref 3.5–5.0)
Alkaline Phosphatase: 42 U/L (ref 38–126)
Anion gap: 10 (ref 5–15)
BUN: 22 mg/dL (ref 8–23)
CO2: 20 mmol/L — ABNORMAL LOW (ref 22–32)
Calcium: 8.4 mg/dL — ABNORMAL LOW (ref 8.9–10.3)
Chloride: 108 mmol/L (ref 98–111)
Creatinine, Ser: 1.48 mg/dL — ABNORMAL HIGH (ref 0.44–1.00)
GFR, Estimated: 36 mL/min — ABNORMAL LOW (ref >60)
Glucose, Bld: 117 mg/dL — ABNORMAL HIGH (ref 70–99)
Potassium: 3.8 mmol/L (ref 3.5–5.1)
Sodium: 138 mmol/L (ref 135–145)
Total Bilirubin: 1 mg/dL (ref 0.3–1.2)
Total Protein: 5.8 g/dL — ABNORMAL LOW (ref 6.5–8.1)

## 2021-01-08 LAB — RESP PANEL BY RT-PCR (FLU A&B, COVID) ARPGX2
Influenza A by PCR: NEGATIVE
Influenza B by PCR: NEGATIVE
SARS Coronavirus 2 by RT PCR: POSITIVE — AB

## 2021-01-08 LAB — BRAIN NATRIURETIC PEPTIDE: B Natriuretic Peptide: 214.3 pg/mL — ABNORMAL HIGH (ref 0.0–100.0)

## 2021-01-08 LAB — I-STAT CHEM 8, ED
BUN: 28 mg/dL — ABNORMAL HIGH (ref 8–23)
Calcium, Ion: 1.1 mmol/L — ABNORMAL LOW (ref 1.15–1.40)
Chloride: 111 mmol/L (ref 98–111)
Creatinine, Ser: 1.3 mg/dL — ABNORMAL HIGH (ref 0.44–1.00)
Glucose, Bld: 113 mg/dL — ABNORMAL HIGH (ref 70–99)
HCT: 40 % (ref 36.0–46.0)
Hemoglobin: 13.6 g/dL (ref 12.0–15.0)
Potassium: 4.3 mmol/L (ref 3.5–5.1)
Sodium: 142 mmol/L (ref 135–145)
TCO2: 22 mmol/L (ref 22–32)

## 2021-01-08 LAB — TROPONIN I (HIGH SENSITIVITY)
Troponin I (High Sensitivity): 33 ng/L — ABNORMAL HIGH (ref <18)
Troponin I (High Sensitivity): 27 ng/L — ABNORMAL HIGH (ref <18)

## 2021-01-08 LAB — PROTIME-INR
INR: 2.4 — ABNORMAL HIGH (ref 0.8–1.2)
Prothrombin Time: 26.2 seconds — ABNORMAL HIGH (ref 11.4–15.2)

## 2021-01-08 NOTE — ED Triage Notes (Signed)
Pt bib ems from home c/o chest pain in sternum and left breast pain that started last night. Pt was recently dx CHF. Pt was given 1 nitroglycerin en route. Pt BP initially systolic 140 and dropped to the 70's. Pt has 150 normal saline en route.  HR 115 BP 74/68

## 2021-01-08 NOTE — ED Provider Notes (Signed)
[facility] EMERGENCY DEPARTMENT Provider Note   CSN: [id number] Arrival date & time: 01/08/21  0710     History  Chief Complaint  Patient presents with   Chest Pain    Hannah [name] is a 81 y.o. female.  58      81 year old female with history of acute massive PE in 2014 on Coumadin, diabetes, hypertension, hyperlipidemia, large bowel stricture status post colectomy in 2016, pulmonary hypertension, chronic diastolic congestive heart failure, CKD stage III, hyperlipidemia, recent admission 12/23-1/2 with new diagnosis of nonischemic cardiomyopathy with an ejection fraction of 30%, no obstructive CAD, started on Entresto, continuing metoprolol, COVID-19 infection, paroxysmal atrial fibrillation who presents with concern for chest pain, nausea, generalized weakness and shortness of breath.  Reports to me right sided chest pain that began last night, was feeling short of breath with it.  Has nausea, one episode of pink emesis, thought maybe due to medicines. Denies black or bloody stools.  Reports difficult to describe q Uality of chest pain "it happened so fast" just started feeling really sick, nausea, fatigue, lightheaded. No increased leg swelling. Weights have been up and down since dc a few days ago, no sig trend.  BP was 140s with EMS< given 1 nitro with BP down to the 70s Reports now no dyspnea, no ongoing chest pain   Home Medications Prior to Admission medications   Medication Sig Start Date End Date Taking? Authorizing Provider  acetaminophen (TYLENOL) 500 MG tablet Take 500 mg by mouth every 6 (six) hours as needed for mild pain.     [provider]  atorvastatin (LIPITOR) 40 MG tablet Take 1 tablet (40 mg total) by mouth daily. 01/05/21 02/04/21  Vashti Hey, MD  Blood Glucose Monitoring Suppl (ONETOUCH VERIO) w/Device KIT 1 each by Does not apply route as directed. 06/07/18   [name], MD  dapagliflozin propanediol (FARXIGA)  10 MG TABS tablet Take 1 tablet (10 mg total) by mouth daily. 01/05/21 02/04/21  Vashti Hey, MD  diclofenac Sodium (VOLTAREN) 1 % GEL Apply 4 g topically 4 (four) times daily. Patient taking differently: Apply 4 g topically daily as needed. 06/26/19   Lamptey, Myrene Galas, MD  furosemide (LASIX) 20 MG tablet Take 1 tablet (20 mg total) by mouth daily as needed for fluid or edema. 06/28/18   Colin Benton R, DO  glucose blood (ONETOUCH VERIO) test strip USE 1 STRIP TO CHECK GLUCOSE ONCE DAILY AS DIRECTED 08/14/20   [name], MD  levothyroxine (EUTHYROX) 25 MCG tablet Take 1 tablet (25 mcg total) by mouth daily before breakfast. 08/25/20   Koberlein, Andris Flurry C, MD  metFORMIN (GLUCOPHAGE-XR) 500 MG 24 hr tablet TAKE 1 TABLET BY MOUTH ONCE DAILY WITH BREAKFAST. PATIENT NEEDS AN APPT. 10/16/20   [name], MD  metoprolol succinate (TOPROL-XL) 25 MG 24 hr tablet Take 1.5 tablets (37.5 mg total) by mouth every evening. 01/04/21 02/03/21  Vashti Hey, MD  OneTouch Delica Lancets 70Y MISC USE 1  TO CHECK GLUCOSE ONCE DAILY 07/02/18   Koberlein, Steele Berg, MD  pantoprazole (PROTONIX) 40 MG tablet Take 1 tablet (40 mg total) by mouth daily. 04/26/20   Hosie Poisson, MD  sacubitril-valsartan (ENTRESTO) 24-26 MG Take 1 tablet by mouth 2 (two) times daily. 01/04/21   Vashti Hey, MD  sacubitril-valsartan (ENTRESTO) 24-26 MG Take 1 tablet by mouth 2 (two) times daily. 01/04/21 02/03/21  Vashti Hey, MD  vitamin B-12 (CYANOCOBALAMIN) 1000 MCG tablet  Take 1 tablet (1,000 mcg total) by mouth daily. 05/15/20   [name], MD  vitamin C (ASCORBIC ACID) 500 MG tablet Take 500 mg by mouth daily.     [provider]  warfarin (COUMADIN) 5 MG tablet TAKE 1/2 TO 1 (ONE-HALF TO ONE) TABLET BY MOUTH DAILY AS DIRECTED BY  COUMADIN  CLINIC Patient taking differently: Take 2.5-5 mg by mouth See admin instructions. Taking 1/2 tablet (2.5 mg) Tues, thur, Sat &  Sunday, and taking 1 tablet ( 31m) on Monday, Wed, & Friday. 08/10/20   RFay Records MD      Allergies    Augmentin [amoxicillin-pot clavulanate], Tranxene [clorazepate], Crestor [rosuvastatin], and Penicillins    Review of Systems   Review of Systems See above Physical Exam Updated Vital Signs BP 110/74    Pulse 83    Temp 98.6 F (37 C) (Oral)    Resp 17    SpO2 100%  Physical Exam Vitals and nursing note reviewed.  Constitutional:      General: She is not in acute distress.    Appearance: She is well-developed. She is not diaphoretic.  HENT:     Head: Normocephalic and atraumatic.  Eyes:     Conjunctiva/sclera: Conjunctivae normal.  Cardiovascular:     Rate and Rhythm: Normal rate and regular rhythm.     Heart sounds: Normal heart sounds. No murmur heard.   No friction rub. No gallop.  Pulmonary:     Effort: Pulmonary effort is normal. No respiratory distress.     Breath sounds: Normal breath sounds. No wheezing or rales.  Abdominal:     General: There is no distension.     Palpations: Abdomen is soft.     Tenderness: There is no abdominal tenderness. There is no guarding.  Musculoskeletal:        General: No tenderness.     Cervical back: Normal range of motion.  Skin:    General: Skin is warm and dry.     Findings: No erythema or rash.  Neurological:     Mental Status: She is alert and oriented to person, place, and time.    ED Results / Procedures / Treatments   Labs (all labs ordered are listed, but only abnormal results are displayed) Labs Reviewed  RESP PANEL BY RT-PCR (FLU A&B, COVID) ARPGX2 - Abnormal; Notable for the following components:      Result Value   SARS Coronavirus 2 by RT PCR POSITIVE (*)    All other components within normal limits  CBC WITH DIFFERENTIAL/PLATELET - Abnormal; Notable for the following components:   MCH 24.6 (*)    RDW 20.8 (*)    All other components within normal limits  COMPREHENSIVE METABOLIC PANEL - Abnormal; Notable  for the following components:   CO2 20 (*)    Glucose, Bld 117 (*)    Creatinine, Ser 1.48 (*)    Calcium 8.4 (*)    Total Protein 5.8 (*)    Albumin 2.8 (*)    GFR, Estimated 36 (*)    All other components within normal limits  BRAIN NATRIURETIC PEPTIDE - Abnormal; Notable for the following components:   B Natriuretic Peptide 214.3 (*)    All other components within normal limits  PROTIME-INR - Abnormal; Notable for the following components:   Prothrombin Time 26.2 (*)    INR 2.4 (*)    All other components within normal limits  I-STAT CHEM 8, ED - Abnormal; Notable for the following  components:   BUN 28 (*)    Creatinine, Ser 1.30 (*)    Glucose, Bld 113 (*)    Calcium, Ion 1.10 (*)    All other components within normal limits  TROPONIN I (HIGH SENSITIVITY) - Abnormal; Notable for the following components:   Troponin I (High Sensitivity) 33 (*)    All other components within normal limits  TROPONIN I (HIGH SENSITIVITY) - Abnormal; Notable for the following components:   Troponin I (High Sensitivity) 27 (*)    All other components within normal limits  URINALYSIS, ROUTINE W REFLEX MICROSCOPIC    EKG EKG Interpretation  Date/Time:  Friday January 08 2021 07:18:50 EST Ventricular Rate:  100 PR Interval:  168 QRS Duration: 134 QT Interval:  364 QTC Calculation: 469 R Axis:   -64 Text Interpretation: Normal sinus rhythm Left axis deviation Left ventricular hypertrophy with QRS widening and repolarization abnormality ( R in aVL , Cornell product ) Cannot rule out Septal infarct , age undetermined Abnormal ECG No significant change since last tracing Confirmed by Gareth Morgan [phone]) on 01/08/2021 7:23:34 AM  Radiology DG Chest 2 View  Result Date: 01/08/2021 CLINICAL DATA:  Substernal chest and left breast pain since last night. EXAM: CHEST - 2 VIEW COMPARISON:  Radiographs 12/27/2020.  CT 12/15/2018. FINDINGS: 0952 hours. The heart size and mediastinal contours are stable  with mild aortic atherosclerosis. There is mild subpleural reticulation in both lungs which otherwise appear clear. No pleural effusion or pneumothorax. No acute osseous findings are evident. There are degenerative changes in both shoulders. Telemetry leads overlie the chest. IMPRESSION: Stable chest.  No evidence of active cardiopulmonary process. Electronically Signed   By: Richardean Sale M.D.   On: 01/08/2021 10:02    Procedures Procedures    Medications Ordered in ED Medications - No data to display  ED Course/ Medical Decision Making/ A&P                           Medical Decision Making   81 year old female with history of acute massive PE in 2014 on Coumadin, diabetes, hypertension, hyperlipidemia, large bowel stricture status post colectomy in 2016, pulmonary hypertension, chronic diastolic congestive heart failure, CKD stage III, hyperlipidemia, recent admission 12/23-1/2 with new diagnosis of nonischemic cardiomyopathy with an ejection fraction of 30%, no obstructive CAD, started on Entresto, continuing metoprolol, COVID-19 infection, paroxysmal atrial fibrillation who presents with concern for chest pain, nausea, generalized weakness and shortness of breath.  Hypotensive on arrival, has just received nitroglycerin after reportedly normal BP. Feels improved on arrival--given gentle fluids but will continue to monitor as feel nitro likely etiology of this change and pt with hx of newly diagnosed CHF with this also potentially contributing to symptoms.    Differential diagnosis includes ACS, PE, COPD exacerbation, CHF exacerbation, anemia, pneumonia, viral etiology such as COVID 19 infection, metabolic abnormality.  Chest x-ray was done which showed stable chest, no sign of pneumonia, pulmonary edema., pneumothorax. Heart size is unchanged, vs improved, doubt tamponade.  EKG was evaluated by me which showed no change in comparison to prior.  BNP was 200s, slightly elevated in comparison  to prior.  INR therapeutic and doubt PE.  Blood pressures improved while in ED initial drop likely secondary to nitroglycerin. She clinically does not appear to be volume overloaded and recommend continuation of medications prescribed at recent visit. Delta troponins 31 and 27 (in comparison to previous greater than 1000s). Given recent catheterization showing  nonobstructive disease, do not feel she needs readmission for cardiac evaluation in setting of stable troponins and that troponin elevation likely secondary to recent stress, new CHF diagnosis.  Recommend close follow up with Cardiology and strict return precautions.          Final Clinical Impression(s) / ED Diagnoses Final diagnoses:  Chest pain, unspecified type  Chronic systolic congestive heart failure [facility])    Rx / DC Orders ED Discharge Orders     None         Gareth Morgan, MD 01/08/21 1314

## 2021-01-11 ENCOUNTER — Telehealth: Payer: Self-pay | Admitting: Family Medicine

## 2021-01-11 ENCOUNTER — Encounter: Payer: Self-pay | Admitting: Physician Assistant

## 2021-01-11 DIAGNOSIS — M1990 Unspecified osteoarthritis, unspecified site: Secondary | ICD-10-CM | POA: Diagnosis not present

## 2021-01-11 DIAGNOSIS — E039 Hypothyroidism, unspecified: Secondary | ICD-10-CM | POA: Diagnosis not present

## 2021-01-11 DIAGNOSIS — E1122 Type 2 diabetes mellitus with diabetic chronic kidney disease: Secondary | ICD-10-CM | POA: Diagnosis not present

## 2021-01-11 DIAGNOSIS — I130 Hypertensive heart and chronic kidney disease with heart failure and stage 1 through stage 4 chronic kidney disease, or unspecified chronic kidney disease: Secondary | ICD-10-CM | POA: Diagnosis not present

## 2021-01-11 DIAGNOSIS — Z87891 Personal history of nicotine dependence: Secondary | ICD-10-CM | POA: Diagnosis not present

## 2021-01-11 DIAGNOSIS — Z7984 Long term (current) use of oral hypoglycemic drugs: Secondary | ICD-10-CM | POA: Diagnosis not present

## 2021-01-11 DIAGNOSIS — I5032 Chronic diastolic (congestive) heart failure: Secondary | ICD-10-CM | POA: Diagnosis not present

## 2021-01-11 DIAGNOSIS — I480 Paroxysmal atrial fibrillation: Secondary | ICD-10-CM | POA: Diagnosis not present

## 2021-01-11 DIAGNOSIS — E785 Hyperlipidemia, unspecified: Secondary | ICD-10-CM | POA: Diagnosis not present

## 2021-01-11 DIAGNOSIS — U071 COVID-19: Secondary | ICD-10-CM | POA: Diagnosis not present

## 2021-01-11 DIAGNOSIS — Z86718 Personal history of other venous thrombosis and embolism: Secondary | ICD-10-CM | POA: Diagnosis not present

## 2021-01-11 DIAGNOSIS — I255 Ischemic cardiomyopathy: Secondary | ICD-10-CM | POA: Diagnosis not present

## 2021-01-11 DIAGNOSIS — I4892 Unspecified atrial flutter: Secondary | ICD-10-CM | POA: Diagnosis not present

## 2021-01-11 DIAGNOSIS — I272 Pulmonary hypertension, unspecified: Secondary | ICD-10-CM | POA: Diagnosis not present

## 2021-01-11 DIAGNOSIS — Z7901 Long term (current) use of anticoagulants: Secondary | ICD-10-CM | POA: Diagnosis not present

## 2021-01-11 DIAGNOSIS — Z96642 Presence of left artificial hip joint: Secondary | ICD-10-CM | POA: Diagnosis not present

## 2021-01-11 DIAGNOSIS — I214 Non-ST elevation (NSTEMI) myocardial infarction: Secondary | ICD-10-CM | POA: Diagnosis not present

## 2021-01-11 DIAGNOSIS — Z96659 Presence of unspecified artificial knee joint: Secondary | ICD-10-CM | POA: Diagnosis not present

## 2021-01-11 DIAGNOSIS — E1142 Type 2 diabetes mellitus with diabetic polyneuropathy: Secondary | ICD-10-CM | POA: Diagnosis not present

## 2021-01-11 DIAGNOSIS — Z86711 Personal history of pulmonary embolism: Secondary | ICD-10-CM | POA: Diagnosis not present

## 2021-01-11 DIAGNOSIS — I420 Dilated cardiomyopathy: Secondary | ICD-10-CM | POA: Diagnosis not present

## 2021-01-11 DIAGNOSIS — N1831 Chronic kidney disease, stage 3a: Secondary | ICD-10-CM | POA: Diagnosis not present

## 2021-01-11 DIAGNOSIS — I5020 Unspecified systolic (congestive) heart failure: Secondary | ICD-10-CM | POA: Diagnosis not present

## 2021-01-11 NOTE — Telephone Encounter (Signed)
ok 

## 2021-01-11 NOTE — Progress Notes (Signed)
Cardiology Office Note:    Date:  01/12/2021   ID:  Hannah [name], DOB June 08, 1940, MRN [id number]  PCP:  [name], MD   [facility] Providers Cardiologist:  Dorris Carnes, MD Cardiology APP:  Sharmon [place]    Referring MD: [name], MD   Chief Complaint:  Hospitalization Follow-up (Admx in 12/22 w CP/COVID/New HFrEF; ED visit 01/08/21 w CP)    Patient Profile:   Hannah [name] is a 81 y.o. female with:  Prior massive pulmonary embolism  Lifelong anticoagulation (Warfarin) (HFrEF) heart failure with reduced ejection fraction  Non-ischemic cardiomyopathy  Previously managed for Diastolic CHF Echocardiogram 10/2018: EF 55-60, Gr 1 DD Echocardiogram 4/22: EF 45-50 Echocardiogram 12/22: EF 30-35, Gr 1 DD, mild MR, mod TR Coronary calcification on CT Myoview 12/2018: Low risk Cath 12/22: no sig CAD (prox to mid RCA 20) Paroxysmal atrial fibrillation  Noted during admx for COVID-19, new onset CHF, chest pain in 12/22 Diabetes mellitus  Hypertension  Hyperlipidemia  Hypothyroidism  Chronic kidney disease  Hx of large bowel stricture, s/p colectomy w colostomy  admx in 12/2017 w L breast hematoma and cellulitis; Hgb dropped from 11.5>>8.8 (INR = 3.48); Warfarin held for 2 weeks admx for Syncope in 10/2018; neg workup (CT, MRI, Echocardiogram); felt to be due to orthostatic hypotension; poor hydration  Hx of LGI bleed (?diverticular) in 04/2020   Prior CV studies: Cardiac catheterization Jan 10, 2021 RCA prox to mid 20  Cardiac catheterization 12/30/20 Normal R heart pressures Unsuccessful L heart cath > returned 2021-01-10  Echocardiogram 12/26/20 EF 30-35, ant septum/apex, mid inf-sept AK; ant-lat/ant/inf HK, Gr 1 DD, mild LVH, normal RVSF, RVSP 27.2, small effusion (ant to RV), mild MR, mod TR, AV sclerosis w/o AS  Echocardiogram 04/23/20 Inf HK, EF 45-50, mild LVH, Gr 1 DD  Myoview 12/16/2018 No ischemia or infarction, EF 57; Low Risk     Echocardiogram 10/31/2018 EF 55-60, Gr 1 DD, normal RVSF, mild MAC, mild MR, mild TR  History of Present Illness: Ms. Brester was last seen in 6/21.   She was admitted 12/23-1/2 with chest pain.  Her hs-Troponin levels were elevated and she was noted to be COVID-19 positive.  An echocardiogram showed reduced EF at 30-35 with multiple WMAs.  Cardiac catheterization demonstrated mild non-obstructive plaque in the RCA and o/w normal coronary arteries c/w non-ischemic cardiomyopathy.   Her hospital course was c/b 1 episode of AF w RVR w spontaneous conversion to NSR.  She is on chronic warfarin due to hx of massive PE resulting in PEA arrest in the past.    She went to the ED with chest pain, shortness of breath, weakness, nausea on 01/08/21.  Her BP was low in the setting of using NTG and she was given IVFs.  She had no edema on her CXR.  Her BNP was mildly elevated.  Her hs-Trops were minimally elevated without significant trend (33>>27).  Other labs: Cr 1.48, K 3.8, ALT 13, Hgb 12.1.  EKG demonstrated LVH w repol and no significant changes.   She returns for post hospital f/u.  She is here with her daughter.  She remains weak and has HHPT coming to her house.  She uses a rolling walker.  She has not had any worsening shortness of breath.  She has not had further chest pain.  She has not had orthopnea, significant leg edema, syncope or near syncope.    ASSESSMENT & PLAN:   HFrEF (heart failure with reduced ejection fraction) ([place])  Non-ischemic cardiomyopathy.  EF 30-35.  NYHA III.  Volume status stable.  She remains weak.  Her BP is good.  We discussed GDMT for HFrEF to include ARNI, beta-blocker, MRA and SGLT2i.  She is already taking Entresto, Dapagliflozin, Metoprolol succinate.  I think her BP could tolerate changing from Metoprolol succinate to Carvedilol or adding Spironolactone 12.5 mg once daily.  However, she remains weak and she had a significant drop in her BP recently with taking NTG.  She is  working with [facility].  I recommend continuing current dose of Metoprolol succinate 37.5 mg once daily, Entresto 24/26 mg twice daily and Dapagliflozin 10 mg daily for now.  We discussed daily weights and when to take as needed furosemide.  F/u in 4-6 weeks.  Consider changing Metoprolol succinate to Carvedilol vs adding Spironolactone at that visit.  Once GDMT maximized, we will need to arrange a f/u Echocardiogram to recheck EF.    PAF (paroxysmal atrial fibrillation) (HCC) Maintaining NSR.  She is on chronic anticoagulation due to prior hx of massive pulmonary embolism c/b PEA arrest.    Hyperlipidemia Recent LDL close to goal.  Continue Atorvastatin 40 mg once daily.    CAD (coronary artery disease) Minimal non-obstructive CAD by recent cath. She is not having angina.  She is not on ASA as she is on Warfarin. Continue statin Rx.   Hypertension The patient's blood pressure is controlled on her current regimen.  Continue current therapy.    History of pulmonary embolism She remains on lifelong anticoagulation with Warfarin.       Dispo:  Return in about 6 weeks (around 02/23/2021) for Routine follow up in 6 weeks with Richardson Dopp, PA-C.Marland Kitchen      Past Medical History:  Diagnosis Date   Acute massive pulmonary embolism ([place]) 07/13/2012   Massive PE w/ PEA arrest 07/13/12 >TNK >IVC filter >discharged on comadin     Anticoagulated on warfarin    Chronic diastolic CHF (congestive heart failure) ([place]) 03/15/2017   Chronic kidney disease, stage 3a ([place]) 12/15/2018   Diabetes mellitus, type 2 ([place]) 07/15/2012   with peripheral neuropathy   DM2 (diabetes mellitus, type 2) ([place]) 07/23/2012   HFrEF (heart failure with reduced ejection fraction) ([place]) 03/15/2017   Non-ischemic cardiomyopathy // Cardiac catheterization 01/01/21:  RCA prox to mid 20 // Echocardiogram 12/26/20:  EF 30-35, ant septum/apex, mid inf-sept AK; ant-lat/ant/inf HK, Gr 1 DD, mild LVH, normal RVSF, RVSP 27.2, small effusion (ant to  RV), mild MR, mod TR, AV sclerosis w/o AS   Hyperlipemia 07/14/2012   Hyperlipidemia 02/25/2013   Hypertension 07/14/2012   Hypothyroid 06/06/2013   Large bowel stricture ([place])    s/p colectomy in 2016   Neuropathy 02/12/2015   Osteoarthritis    s/p hip and knee replacements   PAF (paroxysmal atrial fibrillation) ([place])    Pulmonary hypertension ([place]) 07/26/2016   Syncope 10/2018   Thyroid disease    Current Medications: Current Meds  Medication Sig   acetaminophen (TYLENOL) 500 MG tablet Take 500 mg by mouth every 6 (six) hours as needed for mild pain.    atorvastatin (LIPITOR) 40 MG tablet Take 1 tablet (40 mg total) by mouth daily.   Blood Glucose Monitoring Suppl (ONETOUCH VERIO) w/Device KIT 1 each by Does not apply route as directed.   dapagliflozin propanediol (FARXIGA) 10 MG TABS tablet Take 1 tablet (10 mg total) by mouth daily.   diclofenac Sodium (VOLTAREN) 1 % GEL Apply 4 g topically 4 (four) times daily.  furosemide (LASIX) 20 MG tablet Take 1 tablet (20 mg total) by mouth daily as needed for fluid or edema.   glucose blood (ONETOUCH VERIO) test strip USE 1 STRIP TO CHECK GLUCOSE ONCE DAILY AS DIRECTED   levothyroxine (EUTHYROX) 25 MCG tablet Take 1 tablet (25 mcg total) by mouth daily before breakfast.   metFORMIN (GLUCOPHAGE-XR) 500 MG 24 hr tablet TAKE 1 TABLET BY MOUTH ONCE DAILY WITH BREAKFAST. PATIENT NEEDS AN APPT.   metoprolol succinate (TOPROL-XL) 25 MG 24 hr tablet Take 1.5 tablets (37.5 mg total) by mouth every evening.   OneTouch Delica Lancets 15I MISC USE 1  TO CHECK GLUCOSE ONCE DAILY   pantoprazole (PROTONIX) 40 MG tablet Take 1 tablet (40 mg total) by mouth daily.   sacubitril-valsartan (ENTRESTO) 24-26 MG Take 1 tablet by mouth 2 (two) times daily.   sacubitril-valsartan (ENTRESTO) 24-26 MG Take 1 tablet by mouth 2 (two) times daily.   vitamin B-12 (CYANOCOBALAMIN) 1000 MCG tablet Take 1 tablet (1,000 mcg total) by mouth daily.   vitamin C (ASCORBIC ACID) 500  MG tablet Take 500 mg by mouth daily.    warfarin (COUMADIN) 5 MG tablet TAKE 1/2 TO 1 (ONE-HALF TO ONE) TABLET BY MOUTH DAILY AS DIRECTED BY  COUMADIN  CLINIC    Allergies:   Augmentin [amoxicillin-pot clavulanate], Tranxene [clorazepate], Crestor [rosuvastatin], and Penicillins   Social History   Tobacco Use   Smoking status: Former    Packs/day: 1.00    Years: 10.00    Pack years: 10.00    Types: Cigarettes    Quit date: 01/04/1968    Years since quitting: 53.0   Smokeless tobacco: Never  Vaping Use   Vaping Use: Never used  Substance Use Topics   Alcohol use: No   Drug use: No    Family Hx: The patient's family history includes Breast cancer in her mother.  Review of Systems  Constitutional: Positive for malaise/fatigue.    EKGs/Labs/Other Test Reviewed:    EKG:  EKG is  ordered today.  The ekg ordered today demonstrates normal sinus rhythm, HR 81, LAFB, LVH with repol abnormality, QTc 429 ms, no change from prior tracing   Recent Labs: 08/19/2020: TSH 2.93 12/28/2020: Magnesium 2.0 01/08/2021: ALT 13; B Natriuretic Peptide 214.3; BUN 28; Creatinine, Ser 1.30; Hemoglobin 13.6; Platelets 283; Potassium 4.3; Sodium 142   Recent Lipid Panel Lab Results  Component Value Date/Time   CHOL 149 08/19/2020 03:16 PM   TRIG 112.0 08/19/2020 03:16 PM   HDL 53.10 08/19/2020 03:16 PM   LDLCALC 74 08/19/2020 03:16 PM   LDLCALC 95 10/30/2019 11:09 AM    Risk Assessment/Calculations:    CHA2DS2-VASc Score = 9   This indicates a 12.2% annual risk of stroke. The patient's score is based upon: CHF History: 1 HTN History: 1 Diabetes History: 1 Stroke History: 2 Vascular Disease History: 1 Age Score: 2 Gender Score: 1         Physical Exam:    VS:  BP 130/68    Pulse 81    Ht _0  (1.676 m)    Wt 171 lb 9.6 oz (77.8 kg)    SpO2 97%    BMI 27.70 kg/m     Wt Readings from Last 3 Encounters:  01/12/21 171 lb 9.6 oz (77.8 kg)  12/31/20 168 lb 6.9 oz (76.4 kg)  08/19/20  172 lb 6.4 oz (78.2 kg)    Constitutional:      Appearance: Healthy appearance. Not in distress.  Neck:     Vascular: No JVR.  Pulmonary:     Effort: Pulmonary effort is normal.     Breath sounds: No wheezing. No rales.  Cardiovascular:     Normal rate. Regular rhythm. Normal S1. Normal S2.      Murmurs: There is no murmur.  Edema:    Peripheral edema present.    Ankle: bilateral trace edema of the ankle. Abdominal:     Palpations: Abdomen is soft.  Skin:    General: Skin is warm and dry.  Neurological:     Mental Status: Alert and oriented to person, place and time.     Cranial Nerves: Cranial nerves are intact.     Medication Adjustments/Labs and Tests Ordered: Current medicines are reviewed at length with the patient today.  Concerns regarding medicines are outlined above.  Tests Ordered: Orders Placed This Encounter  Procedures   EKG 12-Lead   Medication Changes: No orders of the defined types were placed in this encounter.  Signed, Richardson Dopp, PA-C  01/12/2021 2:38 PM    [facility] [address], [place], [place]  [address] Phone: [phone]; Fax: [phone]

## 2021-01-11 NOTE — Telephone Encounter (Signed)
Hannah [name] PT will [facility] is calling and needs verbal orders for PT 1x3 for strengthening and gait mobility

## 2021-01-12 ENCOUNTER — Ambulatory Visit (INDEPENDENT_AMBULATORY_CARE_PROVIDER_SITE_OTHER): Payer: Medicare Other

## 2021-01-12 ENCOUNTER — Other Ambulatory Visit: Payer: Self-pay

## 2021-01-12 ENCOUNTER — Encounter: Payer: Self-pay | Admitting: Physician Assistant

## 2021-01-12 ENCOUNTER — Ambulatory Visit: Payer: Medicare Other | Admitting: Physician Assistant

## 2021-01-12 VITALS — BP 130/68 | HR 81 | Ht 66.0 in | Wt 171.6 lb

## 2021-01-12 DIAGNOSIS — I10 Essential (primary) hypertension: Secondary | ICD-10-CM

## 2021-01-12 DIAGNOSIS — Z7901 Long term (current) use of anticoagulants: Secondary | ICD-10-CM

## 2021-01-12 DIAGNOSIS — I2699 Other pulmonary embolism without acute cor pulmonale: Secondary | ICD-10-CM | POA: Diagnosis not present

## 2021-01-12 DIAGNOSIS — I5020 Unspecified systolic (congestive) heart failure: Secondary | ICD-10-CM

## 2021-01-12 DIAGNOSIS — E7800 Pure hypercholesterolemia, unspecified: Secondary | ICD-10-CM

## 2021-01-12 DIAGNOSIS — Z5181 Encounter for therapeutic drug level monitoring: Secondary | ICD-10-CM

## 2021-01-12 DIAGNOSIS — Z86711 Personal history of pulmonary embolism: Secondary | ICD-10-CM

## 2021-01-12 DIAGNOSIS — I480 Paroxysmal atrial fibrillation: Secondary | ICD-10-CM

## 2021-01-12 DIAGNOSIS — I2510 Atherosclerotic heart disease of native coronary artery without angina pectoris: Secondary | ICD-10-CM

## 2021-01-12 LAB — POCT INR: INR: 2.5 (ref 2.0–3.0)

## 2021-01-12 NOTE — Assessment & Plan Note (Signed)
Minimal non-obstructive CAD by recent cath. She is not having angina.  She is not on ASA as she is on Warfarin. Continue statin Rx.

## 2021-01-12 NOTE — Assessment & Plan Note (Signed)
Recent LDL close to goal.  Continue Atorvastatin 40 mg once daily.

## 2021-01-12 NOTE — Patient Instructions (Signed)
Description   Continue on same dosage of Warfarin 1/2 tablet daily except for 1 tablet on Mondays, Wednesdays and Fridays. Keep a green leafy veggie in your diet 2-3 times a week. Recheck INR in 2 weeks.  Cou[facility]in Clinic [phone].

## 2021-01-12 NOTE — Assessment & Plan Note (Signed)
Maintaining NSR.  She is on chronic anticoagulation due to prior hx of massive pulmonary embolism c/b PEA arrest.

## 2021-01-12 NOTE — Telephone Encounter (Signed)
Left a detailed message with the approval for orders as below on Hannah [name]'s voicemail.

## 2021-01-12 NOTE — Assessment & Plan Note (Signed)
Non-ischemic cardiomyopathy.  EF 30-35.  NYHA III.  Volume status stable.  She remains weak.  Her BP is good.  We discussed GDMT for HFrEF to include ARNI, beta-blocker, MRA and SGLT2i.  She is already taking Entresto, Dapagliflozin, Metoprolol succinate.  I think her BP could tolerate changing from Metoprolol succinate to Carvedilol or adding Spironolactone 12.5 mg once daily.  However, she remains weak and she had a significant drop in her BP recently with taking NTG.  She is working with HHPT.  I recommend continuing current dose of Metoprolol succinate 37.5 mg once daily, Entresto 24/26 mg twice daily and Dapagliflozin 10 mg daily for now.  We discussed daily weights and when to take as needed furosemide.  F/u in 4-6 weeks.  Consider changing Metoprolol succinate to Carvedilol vs adding Spironolactone at that visit.  Once GDMT maximized, we will need to arrange a f/u Echocardiogram to recheck EF.

## 2021-01-12 NOTE — Assessment & Plan Note (Signed)
She remains on lifelong anticoagulation with Warfarin.

## 2021-01-12 NOTE — Patient Instructions (Signed)
Medication Instructions:   Your physician recommends that you continue on your current medications as directed. Please refer to the Current Medication list given to you today.  *If you need a refill on your cardiac medications before your next appointment, please call your pharmacy*   Lab Work:  -NONE  If you have labs (blood work) drawn today and your tests are completely normal, you will receive your results only by: MyChart Message (if you have MyChart) OR A paper copy in the mail If you have any lab test that is abnormal or we need to change your treatment, we will call you to review the results.   Testing/Procedures:  -NONE   Follow-Up: At [facility], you and your health needs are our priority.  As part of our continuing mission to provide you with exceptional heart care, we have created designated Provider Care Teams.  These Care Teams include your primary Cardiologist (physician) and Advanced Practice Providers (APPs -  Physician Assistants and Nurse Practitioners) who all work together to provide you with the care you need, when you need it.  We recommend signing up for the patient portal called "MyChart".  Sign up information is provided on this After Visit Summary.  MyChart is used to connect with patients for Virtual Visits (Telemedicine).  Patients are able to view lab/test results, encounter notes, upcoming appointments, etc.  Non-urgent messages can be sent to your provider as well.   To learn more about what you can do with MyChart, go to [email or web address].    Your next appointment:   6 week(s)  The format for your next appointment:   In Person  Provider:   Tereso Newcomer, PA-C     Other Instructions

## 2021-01-12 NOTE — Assessment & Plan Note (Signed)
The patient's blood pressure is controlled on her current regimen.  Continue current therapy.   

## 2021-01-13 ENCOUNTER — Encounter: Payer: Self-pay | Admitting: Family Medicine

## 2021-01-13 ENCOUNTER — Ambulatory Visit (INDEPENDENT_AMBULATORY_CARE_PROVIDER_SITE_OTHER): Payer: Medicare Other | Admitting: Family Medicine

## 2021-01-13 VITALS — BP 130/68 | HR 103 | Temp 97.8°F | Resp 18 | Ht 67.0 in | Wt 170.4 lb

## 2021-01-13 DIAGNOSIS — I5020 Unspecified systolic (congestive) heart failure: Secondary | ICD-10-CM

## 2021-01-13 DIAGNOSIS — E1142 Type 2 diabetes mellitus with diabetic polyneuropathy: Secondary | ICD-10-CM | POA: Diagnosis not present

## 2021-01-13 DIAGNOSIS — E7800 Pure hypercholesterolemia, unspecified: Secondary | ICD-10-CM

## 2021-01-13 DIAGNOSIS — I428 Other cardiomyopathies: Secondary | ICD-10-CM | POA: Diagnosis not present

## 2021-01-13 DIAGNOSIS — I480 Paroxysmal atrial fibrillation: Secondary | ICD-10-CM | POA: Diagnosis not present

## 2021-01-13 DIAGNOSIS — N183 Chronic kidney disease, stage 3 unspecified: Secondary | ICD-10-CM

## 2021-01-13 DIAGNOSIS — E038 Other specified hypothyroidism: Secondary | ICD-10-CM

## 2021-01-13 MED ORDER — ATORVASTATIN CALCIUM 40 MG PO TABS: 40.0000 mg | ORAL_TABLET | Freq: Every day | ORAL | 0 refills | Status: DC

## 2021-01-13 MED ORDER — DAPAGLIFLOZIN PROPANEDIOL 10 MG PO TABS: 10.0000 mg | ORAL_TABLET | Freq: Every day | ORAL | 0 refills | Status: AC

## 2021-01-13 MED ORDER — ENTRESTO 24-26 MG PO TABS: 1.0000 | ORAL_TABLET | Freq: Two times a day (BID) | ORAL | 0 refills | Status: DC

## 2021-01-13 NOTE — Progress Notes (Signed)
Hannah [name] DOB: 1940-02-14 Encounter date: 01/13/2021  This is a 81 y.o. female who presents with Chief Complaint  Patient presents with   Hospitalization Follow-up    No concerns.     History of present illness:  Patient was admitted on 12/23 and discharged on 01/04/2021.  She presented with chest pain and was noted to have mildly elevated troponin as well as being COVID-positive.  See hospital diagnoses below:  Newly diagnosed HFrEF/nonischemic cardiomyopathy EF 30% with SWMA LHC done 01/01/2021 revealed no obstructive CAD. She has diuresed well and volume status is at goal Continue metoprolol and Farxiga Patient was initiated on Entresto and is tolerating well, creatinine is at baseline and BP is stable. Patient to follow-up with cardiology transitions of care clinic.   COVID-19 infection No respiratory symptoms, no hypoxia, chest x-ray negative for pneumonia. Status post treatment with remdesivir. Patient was diagnosed on 10/25/2020 and is now 10 days postdiagnosis so no longer has to quarantine.   Paroxysmal atrial fibrillation Presently back in sinus, there was some discussion of initiating amiodarone, per cardiology Restarted back on Coumadin Continue Toprol-XL   HTN Has been low in the past Continue Toprol-XL Will need close outpatient follow-up given initiation of Entresto Lisinopril has been discontinued   CKD 3 AA Creatinine is stable   DM2 Continue metformin and Farxiga  Patient returned to the emergency room on 1/6 for chest pain.  Evaluation was stable at that time and she was discharged home.  She does have follow up visit with cardiology scheduled in February.  She states today that she is feeling ok. She has not had chest pain since that ER follow up. Had one visit with PT Monday. Daughter talked to him and he will be out weekly for next few weeks. She is working on some exercises on her own at home with legs.   Not very much appetite, but is  eating. Does have aid on Monday, weds, fri to help with breakfast/lunch and daughters usually supply dinner.   No dizziness or light headedness.     Allergies  Allergen Reactions   Augmentin [Amoxicillin-Pot Clavulanate] Itching and Other (See Comments)    Severe vaginal itching   Tranxene [Clorazepate] Itching   Crestor [Rosuvastatin]     Made her sick on stomach, she is able to tolerate the zocor   Penicillins Itching and Rash    Has patient had a PCN reaction causing immediate rash, facial/tongue/throat swelling, SOB or lightheadedness with hypotension: Yes Has patient had a PCN reaction causing severe rash involving mucus membranes or skin necrosis: No Has patient had a PCN reaction that required hospitalization: No Has patient had a PCN reaction occurring within the last 10 years: No If all of the above answers are "NO", then may proceed with Cephalosporin use.    Current Meds  Medication Sig   acetaminophen (TYLENOL) 500 MG tablet Take 500 mg by mouth every 6 (six) hours as needed for mild pain.    atorvastatin (LIPITOR) 40 MG tablet Take 1 tablet (40 mg total) by mouth daily.   Blood Glucose Monitoring Suppl (ONETOUCH VERIO) w/Device KIT 1 each by Does not apply route as directed.   dapagliflozin propanediol (FARXIGA) 10 MG TABS tablet Take 1 tablet (10 mg total) by mouth daily.   diclofenac Sodium (VOLTAREN) 1 % GEL Apply 4 g topically 4 (four) times daily.   furosemide (LASIX) 20 MG tablet Take 1 tablet (20 mg total) by mouth daily as needed for fluid or  glucose blood (ONETOUCH VERIO) test strip USE 1 STRIP TO CHECK GLUCOSE ONCE DAILY AS DIRECTED  ° levothyroxine (EUTHYROX) 25 MCG tablet Take 1 tablet (25 mcg total) by mouth daily before breakfast.  ° metFORMIN (GLUCOPHAGE-XR) 500 MG 24 hr tablet TAKE 1 TABLET BY MOUTH ONCE DAILY WITH BREAKFAST. PATIENT NEEDS AN APPT.  ° metoprolol succinate (TOPROL-XL) 25 MG 24 hr tablet Take 1.5 tablets (37.5 mg total) by mouth every  evening.  ° OneTouch Delica Lancets 30G MISC USE 1  TO CHECK GLUCOSE ONCE DAILY  ° pantoprazole (PROTONIX) 40 MG tablet Take 1 tablet (40 mg total) by mouth daily.  ° sacubitril-valsartan (ENTRESTO) 24-26 MG Take 1 tablet by mouth 2 (two) times daily.  ° sacubitril-valsartan (ENTRESTO) 24-26 MG Take 1 tablet by mouth 2 (two) times daily.  ° vitamin B-12 (CYANOCOBALAMIN) 1000 MCG tablet Take 1 tablet (1,000 mcg total) by mouth daily.  ° vitamin C (ASCORBIC ACID) 500 MG tablet Take 500 mg by mouth daily.   ° warfarin (COUMADIN) 5 MG tablet TAKE 1/2 TO 1 (ONE-HALF TO ONE) TABLET BY MOUTH DAILY AS DIRECTED BY  COUMADIN  CLINIC  ° ° °Review of Systems  °Constitutional:  Negative for chills, fatigue and fever.  °Respiratory:  Negative for cough, chest tightness, shortness of breath and wheezing.   °Cardiovascular:  Negative for chest pain, palpitations and leg swelling.  ° °Objective: ° °BP 130/68    Pulse (!) 103    Temp 97.8 °F (36.6 °C) (Oral)    Resp 18    Ht 5' 7" (1.702 m)    Wt 170 lb 6.4 oz (77.3 kg)    SpO2 96%    BMI 26.69 kg/m²   Weight: 170 lb 6.4 oz (77.3 kg)  ° °BP Readings from Last 3 Encounters:  °01/13/21 130/68  °01/12/21 130/68  °01/08/21 110/74  ° °Wt Readings from Last 3 Encounters:  °01/13/21 170 lb 6.4 oz (77.3 kg)  °01/12/21 171 lb 9.6 oz (77.8 kg)  °12/31/20 168 lb 6.9 oz (76.4 kg)  ° ° °Physical Exam °Constitutional:   °   General: She is not in acute distress. °   Appearance: She is well-developed.  °Cardiovascular:  °   Rate and Rhythm: Normal rate and regular rhythm.  °   Heart sounds: Normal heart sounds. No murmur heard. °  No friction rub.  °Pulmonary:  °   Effort: Pulmonary effort is normal. No respiratory distress.  °   Breath sounds: Normal breath sounds. No wheezing or rales.  °Musculoskeletal:  °   Right lower leg: No edema.  °   Left lower leg: No edema (trace).  °Neurological:  °   Mental Status: She is alert and oriented to person, place, and time.  °Psychiatric:     °   Behavior:  Behavior normal.  ° ° °Assessment/Plan °1. HFrEF (heart failure with reduced ejection fraction) (HCC) °She is following regularly with cardiology.  Currently on Toprol 25 mg, Entresto 24-26, Lasix 20 mg. ° °2. Nonischemic cardiomyopathy (HCC) °See above.  Following with cardiology regularly.  Just had visit yesterday. ° °3. PAF (paroxysmal atrial fibrillation) (HCC) °Rate controlled.  Continue with Toprol.  On Coumadin. ° °4. Stage 3 chronic kidney disease, unspecified whether stage 3a or 3b CKD (HCC) °Recheck kidney function in a month.  Labwork ordered today.  °- Comprehensive metabolic panel; Future ° °5. Hypocalcemia °Recheck in a couple of months when eating/drinking is back at baseline.  ° °6. Type 2 diabetes mellitus   with diabetic polyneuropathy, without long-term current use of insulin (HCC) °Blood sugar is generally been well controlled.  Farxiga was added per cardiology during most recent hospitalization.  This should also help with sugar control.  Continue with metformin.  Pending follow-up A1c in a couple of months, we may be able to drop the metformin and continue just with the Farxiga for sugar control. °- Hemoglobin A1c; Future °- Microalbumin / creatinine urine ratio; Future ° °7. Other specified hypothyroidism °Continue with Synthroid 25 mcg daily. °- CBC with Differential/Platelet; Future ° °8. Pure hypercholesterolemia °Continue with Lipitor 40 mg daily. °- Lipid panel; Future ° ° ° °Return in about 1 month (around 02/13/2021) for lab visit for bloodwork; chronic condition visit in 3 months. ° ° °Daughter does need FMLA paperwork completed so that she can be present for doc visits when needed. We previously wrote for once a month, but she missed more days with recent hospitalization.  ° ° ° ° ° , MD °

## 2021-01-19 ENCOUNTER — Telehealth: Payer: Self-pay | Admitting: Family Medicine

## 2021-01-19 DIAGNOSIS — E1122 Type 2 diabetes mellitus with diabetic chronic kidney disease: Secondary | ICD-10-CM | POA: Diagnosis not present

## 2021-01-19 DIAGNOSIS — I420 Dilated cardiomyopathy: Secondary | ICD-10-CM | POA: Diagnosis not present

## 2021-01-19 DIAGNOSIS — Z7901 Long term (current) use of anticoagulants: Secondary | ICD-10-CM | POA: Diagnosis not present

## 2021-01-19 DIAGNOSIS — Z96642 Presence of left artificial hip joint: Secondary | ICD-10-CM | POA: Diagnosis not present

## 2021-01-19 DIAGNOSIS — I272 Pulmonary hypertension, unspecified: Secondary | ICD-10-CM | POA: Diagnosis not present

## 2021-01-19 DIAGNOSIS — Z87891 Personal history of nicotine dependence: Secondary | ICD-10-CM | POA: Diagnosis not present

## 2021-01-19 DIAGNOSIS — Z86718 Personal history of other venous thrombosis and embolism: Secondary | ICD-10-CM | POA: Diagnosis not present

## 2021-01-19 DIAGNOSIS — I255 Ischemic cardiomyopathy: Secondary | ICD-10-CM | POA: Diagnosis not present

## 2021-01-19 DIAGNOSIS — I4892 Unspecified atrial flutter: Secondary | ICD-10-CM | POA: Diagnosis not present

## 2021-01-19 DIAGNOSIS — U071 COVID-19: Secondary | ICD-10-CM | POA: Diagnosis not present

## 2021-01-19 DIAGNOSIS — N1831 Chronic kidney disease, stage 3a: Secondary | ICD-10-CM | POA: Diagnosis not present

## 2021-01-19 DIAGNOSIS — E785 Hyperlipidemia, unspecified: Secondary | ICD-10-CM | POA: Diagnosis not present

## 2021-01-19 DIAGNOSIS — M1990 Unspecified osteoarthritis, unspecified site: Secondary | ICD-10-CM | POA: Diagnosis not present

## 2021-01-19 DIAGNOSIS — E039 Hypothyroidism, unspecified: Secondary | ICD-10-CM | POA: Diagnosis not present

## 2021-01-19 DIAGNOSIS — I480 Paroxysmal atrial fibrillation: Secondary | ICD-10-CM | POA: Diagnosis not present

## 2021-01-19 DIAGNOSIS — Z7984 Long term (current) use of oral hypoglycemic drugs: Secondary | ICD-10-CM | POA: Diagnosis not present

## 2021-01-19 DIAGNOSIS — I130 Hypertensive heart and chronic kidney disease with heart failure and stage 1 through stage 4 chronic kidney disease, or unspecified chronic kidney disease: Secondary | ICD-10-CM | POA: Diagnosis not present

## 2021-01-19 DIAGNOSIS — Z86711 Personal history of pulmonary embolism: Secondary | ICD-10-CM | POA: Diagnosis not present

## 2021-01-19 DIAGNOSIS — I5020 Unspecified systolic (congestive) heart failure: Secondary | ICD-10-CM | POA: Diagnosis not present

## 2021-01-19 DIAGNOSIS — I5032 Chronic diastolic (congestive) heart failure: Secondary | ICD-10-CM | POA: Diagnosis not present

## 2021-01-19 DIAGNOSIS — Z96659 Presence of unspecified artificial knee joint: Secondary | ICD-10-CM | POA: Diagnosis not present

## 2021-01-19 DIAGNOSIS — I214 Non-ST elevation (NSTEMI) myocardial infarction: Secondary | ICD-10-CM | POA: Diagnosis not present

## 2021-01-19 DIAGNOSIS — E1142 Type 2 diabetes mellitus with diabetic polyneuropathy: Secondary | ICD-10-CM | POA: Diagnosis not present

## 2021-01-19 NOTE — Telephone Encounter (Signed)
Hannah [name] called from [facility] for verbal orders for OT, frequency once a week for two weeks.       Good callback number for [facility] [phone] Okay to leave voicemails    Please advise

## 2021-01-20 NOTE — Telephone Encounter (Signed)
ok 

## 2021-01-20 NOTE — Telephone Encounter (Signed)
Verbal order given to [facility] for OT frequency once a week for two weeks; verb understanding.

## 2021-01-21 DIAGNOSIS — I214 Non-ST elevation (NSTEMI) myocardial infarction: Secondary | ICD-10-CM | POA: Diagnosis not present

## 2021-01-21 DIAGNOSIS — Z7984 Long term (current) use of oral hypoglycemic drugs: Secondary | ICD-10-CM | POA: Diagnosis not present

## 2021-01-21 DIAGNOSIS — E785 Hyperlipidemia, unspecified: Secondary | ICD-10-CM | POA: Diagnosis not present

## 2021-01-21 DIAGNOSIS — I5032 Chronic diastolic (congestive) heart failure: Secondary | ICD-10-CM | POA: Diagnosis not present

## 2021-01-21 DIAGNOSIS — Z86711 Personal history of pulmonary embolism: Secondary | ICD-10-CM | POA: Diagnosis not present

## 2021-01-21 DIAGNOSIS — M1990 Unspecified osteoarthritis, unspecified site: Secondary | ICD-10-CM | POA: Diagnosis not present

## 2021-01-21 DIAGNOSIS — Z96642 Presence of left artificial hip joint: Secondary | ICD-10-CM | POA: Diagnosis not present

## 2021-01-21 DIAGNOSIS — I130 Hypertensive heart and chronic kidney disease with heart failure and stage 1 through stage 4 chronic kidney disease, or unspecified chronic kidney disease: Secondary | ICD-10-CM | POA: Diagnosis not present

## 2021-01-21 DIAGNOSIS — E1122 Type 2 diabetes mellitus with diabetic chronic kidney disease: Secondary | ICD-10-CM | POA: Diagnosis not present

## 2021-01-21 DIAGNOSIS — Z7901 Long term (current) use of anticoagulants: Secondary | ICD-10-CM | POA: Diagnosis not present

## 2021-01-21 DIAGNOSIS — Z87891 Personal history of nicotine dependence: Secondary | ICD-10-CM | POA: Diagnosis not present

## 2021-01-21 DIAGNOSIS — I255 Ischemic cardiomyopathy: Secondary | ICD-10-CM | POA: Diagnosis not present

## 2021-01-21 DIAGNOSIS — Z86718 Personal history of other venous thrombosis and embolism: Secondary | ICD-10-CM | POA: Diagnosis not present

## 2021-01-21 DIAGNOSIS — I4892 Unspecified atrial flutter: Secondary | ICD-10-CM | POA: Diagnosis not present

## 2021-01-21 DIAGNOSIS — I420 Dilated cardiomyopathy: Secondary | ICD-10-CM | POA: Diagnosis not present

## 2021-01-21 DIAGNOSIS — I5020 Unspecified systolic (congestive) heart failure: Secondary | ICD-10-CM | POA: Diagnosis not present

## 2021-01-21 DIAGNOSIS — E039 Hypothyroidism, unspecified: Secondary | ICD-10-CM | POA: Diagnosis not present

## 2021-01-21 DIAGNOSIS — U071 COVID-19: Secondary | ICD-10-CM | POA: Diagnosis not present

## 2021-01-21 DIAGNOSIS — N1831 Chronic kidney disease, stage 3a: Secondary | ICD-10-CM | POA: Diagnosis not present

## 2021-01-21 DIAGNOSIS — E1142 Type 2 diabetes mellitus with diabetic polyneuropathy: Secondary | ICD-10-CM | POA: Diagnosis not present

## 2021-01-21 DIAGNOSIS — Z96659 Presence of unspecified artificial knee joint: Secondary | ICD-10-CM | POA: Diagnosis not present

## 2021-01-21 DIAGNOSIS — I480 Paroxysmal atrial fibrillation: Secondary | ICD-10-CM | POA: Diagnosis not present

## 2021-01-21 DIAGNOSIS — I272 Pulmonary hypertension, unspecified: Secondary | ICD-10-CM | POA: Diagnosis not present

## 2021-01-26 ENCOUNTER — Other Ambulatory Visit: Payer: Self-pay

## 2021-01-26 ENCOUNTER — Ambulatory Visit (INDEPENDENT_AMBULATORY_CARE_PROVIDER_SITE_OTHER): Payer: Medicare Other

## 2021-01-26 DIAGNOSIS — I420 Dilated cardiomyopathy: Secondary | ICD-10-CM | POA: Diagnosis not present

## 2021-01-26 DIAGNOSIS — I5032 Chronic diastolic (congestive) heart failure: Secondary | ICD-10-CM | POA: Diagnosis not present

## 2021-01-26 DIAGNOSIS — Z86718 Personal history of other venous thrombosis and embolism: Secondary | ICD-10-CM | POA: Diagnosis not present

## 2021-01-26 DIAGNOSIS — N1831 Chronic kidney disease, stage 3a: Secondary | ICD-10-CM | POA: Diagnosis not present

## 2021-01-26 DIAGNOSIS — U071 COVID-19: Secondary | ICD-10-CM | POA: Diagnosis not present

## 2021-01-26 DIAGNOSIS — E1122 Type 2 diabetes mellitus with diabetic chronic kidney disease: Secondary | ICD-10-CM | POA: Diagnosis not present

## 2021-01-26 DIAGNOSIS — Z7984 Long term (current) use of oral hypoglycemic drugs: Secondary | ICD-10-CM | POA: Diagnosis not present

## 2021-01-26 DIAGNOSIS — Z96642 Presence of left artificial hip joint: Secondary | ICD-10-CM | POA: Diagnosis not present

## 2021-01-26 DIAGNOSIS — Z96659 Presence of unspecified artificial knee joint: Secondary | ICD-10-CM | POA: Diagnosis not present

## 2021-01-26 DIAGNOSIS — I4892 Unspecified atrial flutter: Secondary | ICD-10-CM | POA: Diagnosis not present

## 2021-01-26 DIAGNOSIS — E1142 Type 2 diabetes mellitus with diabetic polyneuropathy: Secondary | ICD-10-CM | POA: Diagnosis not present

## 2021-01-26 DIAGNOSIS — Z86711 Personal history of pulmonary embolism: Secondary | ICD-10-CM | POA: Diagnosis not present

## 2021-01-26 DIAGNOSIS — I272 Pulmonary hypertension, unspecified: Secondary | ICD-10-CM | POA: Diagnosis not present

## 2021-01-26 DIAGNOSIS — I130 Hypertensive heart and chronic kidney disease with heart failure and stage 1 through stage 4 chronic kidney disease, or unspecified chronic kidney disease: Secondary | ICD-10-CM | POA: Diagnosis not present

## 2021-01-26 DIAGNOSIS — E785 Hyperlipidemia, unspecified: Secondary | ICD-10-CM | POA: Diagnosis not present

## 2021-01-26 DIAGNOSIS — Z5181 Encounter for therapeutic drug level monitoring: Secondary | ICD-10-CM | POA: Diagnosis not present

## 2021-01-26 DIAGNOSIS — Z87891 Personal history of nicotine dependence: Secondary | ICD-10-CM | POA: Diagnosis not present

## 2021-01-26 DIAGNOSIS — M1990 Unspecified osteoarthritis, unspecified site: Secondary | ICD-10-CM | POA: Diagnosis not present

## 2021-01-26 DIAGNOSIS — I480 Paroxysmal atrial fibrillation: Secondary | ICD-10-CM | POA: Diagnosis not present

## 2021-01-26 DIAGNOSIS — I255 Ischemic cardiomyopathy: Secondary | ICD-10-CM | POA: Diagnosis not present

## 2021-01-26 DIAGNOSIS — I5020 Unspecified systolic (congestive) heart failure: Secondary | ICD-10-CM | POA: Diagnosis not present

## 2021-01-26 DIAGNOSIS — I214 Non-ST elevation (NSTEMI) myocardial infarction: Secondary | ICD-10-CM | POA: Diagnosis not present

## 2021-01-26 DIAGNOSIS — E039 Hypothyroidism, unspecified: Secondary | ICD-10-CM | POA: Diagnosis not present

## 2021-01-26 DIAGNOSIS — I2699 Other pulmonary embolism without acute cor pulmonale: Secondary | ICD-10-CM | POA: Diagnosis not present

## 2021-01-26 DIAGNOSIS — Z7901 Long term (current) use of anticoagulants: Secondary | ICD-10-CM | POA: Diagnosis not present

## 2021-01-26 LAB — POCT INR: INR: 4.5 — AB (ref 2.0–3.0)

## 2021-01-26 NOTE — Patient Instructions (Signed)
Description   Skip today's dosage of Warfarin, then resume same dosage of Warfarin 1/2 tablet daily except for 1 tablet on Mondays, Wednesdays and Fridays. Keep a green leafy veggie in your diet 2-3 times a week. Recheck INR in 2 weeks.  C[facility]in Clinic [phone].

## 2021-01-27 DIAGNOSIS — E1142 Type 2 diabetes mellitus with diabetic polyneuropathy: Secondary | ICD-10-CM | POA: Diagnosis not present

## 2021-01-27 DIAGNOSIS — I420 Dilated cardiomyopathy: Secondary | ICD-10-CM | POA: Diagnosis not present

## 2021-01-27 DIAGNOSIS — I214 Non-ST elevation (NSTEMI) myocardial infarction: Secondary | ICD-10-CM | POA: Diagnosis not present

## 2021-01-27 DIAGNOSIS — I5020 Unspecified systolic (congestive) heart failure: Secondary | ICD-10-CM | POA: Diagnosis not present

## 2021-01-27 DIAGNOSIS — I255 Ischemic cardiomyopathy: Secondary | ICD-10-CM | POA: Diagnosis not present

## 2021-01-27 DIAGNOSIS — Z7901 Long term (current) use of anticoagulants: Secondary | ICD-10-CM | POA: Diagnosis not present

## 2021-01-27 DIAGNOSIS — Z86711 Personal history of pulmonary embolism: Secondary | ICD-10-CM | POA: Diagnosis not present

## 2021-01-27 DIAGNOSIS — N1831 Chronic kidney disease, stage 3a: Secondary | ICD-10-CM | POA: Diagnosis not present

## 2021-01-27 DIAGNOSIS — I130 Hypertensive heart and chronic kidney disease with heart failure and stage 1 through stage 4 chronic kidney disease, or unspecified chronic kidney disease: Secondary | ICD-10-CM | POA: Diagnosis not present

## 2021-01-27 DIAGNOSIS — Z87891 Personal history of nicotine dependence: Secondary | ICD-10-CM | POA: Diagnosis not present

## 2021-01-27 DIAGNOSIS — E1122 Type 2 diabetes mellitus with diabetic chronic kidney disease: Secondary | ICD-10-CM | POA: Diagnosis not present

## 2021-01-27 DIAGNOSIS — M1990 Unspecified osteoarthritis, unspecified site: Secondary | ICD-10-CM | POA: Diagnosis not present

## 2021-01-27 DIAGNOSIS — I480 Paroxysmal atrial fibrillation: Secondary | ICD-10-CM | POA: Diagnosis not present

## 2021-01-27 DIAGNOSIS — U071 COVID-19: Secondary | ICD-10-CM | POA: Diagnosis not present

## 2021-01-27 DIAGNOSIS — E785 Hyperlipidemia, unspecified: Secondary | ICD-10-CM | POA: Diagnosis not present

## 2021-01-27 DIAGNOSIS — I4892 Unspecified atrial flutter: Secondary | ICD-10-CM | POA: Diagnosis not present

## 2021-01-27 DIAGNOSIS — I5032 Chronic diastolic (congestive) heart failure: Secondary | ICD-10-CM | POA: Diagnosis not present

## 2021-01-27 DIAGNOSIS — E039 Hypothyroidism, unspecified: Secondary | ICD-10-CM | POA: Diagnosis not present

## 2021-01-27 DIAGNOSIS — Z96642 Presence of left artificial hip joint: Secondary | ICD-10-CM | POA: Diagnosis not present

## 2021-01-27 DIAGNOSIS — Z7984 Long term (current) use of oral hypoglycemic drugs: Secondary | ICD-10-CM | POA: Diagnosis not present

## 2021-01-27 DIAGNOSIS — Z86718 Personal history of other venous thrombosis and embolism: Secondary | ICD-10-CM | POA: Diagnosis not present

## 2021-01-27 DIAGNOSIS — I272 Pulmonary hypertension, unspecified: Secondary | ICD-10-CM | POA: Diagnosis not present

## 2021-01-27 DIAGNOSIS — Z96659 Presence of unspecified artificial knee joint: Secondary | ICD-10-CM | POA: Diagnosis not present

## 2021-02-09 ENCOUNTER — Ambulatory Visit: Payer: Medicare Other | Admitting: *Deleted

## 2021-02-09 ENCOUNTER — Other Ambulatory Visit: Payer: Self-pay

## 2021-02-09 DIAGNOSIS — I2699 Other pulmonary embolism without acute cor pulmonale: Secondary | ICD-10-CM | POA: Diagnosis not present

## 2021-02-09 DIAGNOSIS — Z5181 Encounter for therapeutic drug level monitoring: Secondary | ICD-10-CM | POA: Diagnosis not present

## 2021-02-09 LAB — POCT INR: INR: 3 (ref 2.0–3.0)

## 2021-02-09 NOTE — Patient Instructions (Signed)
Description   Continue taking Warfarin 1/2 tablet daily except for 1 tablet on Mondays, Wednesdays and Fridays. Keep a green leafy veggie in your diet 2-3 times a week. Recheck INR in 3 weeks.  Coumadin Clinic [phone].

## 2021-02-15 ENCOUNTER — Other Ambulatory Visit (INDEPENDENT_AMBULATORY_CARE_PROVIDER_SITE_OTHER): Payer: Medicare Other

## 2021-02-15 DIAGNOSIS — N183 Chronic kidney disease, stage 3 unspecified: Secondary | ICD-10-CM | POA: Diagnosis not present

## 2021-02-15 DIAGNOSIS — E1142 Type 2 diabetes mellitus with diabetic polyneuropathy: Secondary | ICD-10-CM

## 2021-02-15 DIAGNOSIS — E7800 Pure hypercholesterolemia, unspecified: Secondary | ICD-10-CM

## 2021-02-15 DIAGNOSIS — E038 Other specified hypothyroidism: Secondary | ICD-10-CM

## 2021-02-15 LAB — CBC WITH DIFFERENTIAL/PLATELET
Basophils Absolute: 0.1 10*3/uL (ref 0.0–0.1)
Basophils Relative: 0.6 % (ref 0.0–3.0)
Eosinophils Absolute: 0.2 10*3/uL (ref 0.0–0.7)
Eosinophils Relative: 1.8 % (ref 0.0–5.0)
HCT: 43.1 % (ref 36.0–46.0)
Hemoglobin: 13.3 g/dL (ref 12.0–15.0)
Lymphocytes Relative: 41.4 % (ref 12.0–46.0)
Lymphs Abs: 3.8 10*3/uL (ref 0.7–4.0)
MCHC: 30.8 g/dL (ref 30.0–36.0)
MCV: 82.6 fl (ref 78.0–100.0)
Monocytes Absolute: 0.5 10*3/uL (ref 0.1–1.0)
Monocytes Relative: 5.1 % (ref 3.0–12.0)
Neutro Abs: 4.7 10*3/uL (ref 1.4–7.7)
Neutrophils Relative %: 51.1 % (ref 43.0–77.0)
Platelets: 241 10*3/uL (ref 150.0–400.0)
RBC: 5.22 Mil/uL — ABNORMAL HIGH (ref 3.87–5.11)
RDW: 23.2 % — ABNORMAL HIGH (ref 11.5–15.5)
WBC: 9.2 10*3/uL (ref 4.0–10.5)

## 2021-02-15 LAB — HEMOGLOBIN A1C: Hgb A1c MFr Bld: 6.3 % (ref 4.6–6.5)

## 2021-02-16 LAB — LIPID PANEL
Cholesterol: 150 mg/dL (ref 0–200)
HDL: 67.5 mg/dL (ref >39.00)
LDL Cholesterol: 66 mg/dL (ref 0–99)
NonHDL: 82.17
Total CHOL/HDL Ratio: 2
Triglycerides: 80 mg/dL (ref 0.0–149.0)
VLDL: 16 mg/dL (ref 0.0–40.0)

## 2021-02-16 LAB — COMPREHENSIVE METABOLIC PANEL
ALT: 8 U/L (ref 0–35)
AST: 16 U/L (ref 0–37)
Albumin: 4.3 g/dL (ref 3.5–5.2)
Alkaline Phosphatase: 57 U/L (ref 39–117)
BUN: 21 mg/dL (ref 6–23)
CO2: 26 mEq/L (ref 19–32)
Calcium: 9.6 mg/dL (ref 8.4–10.5)
Chloride: 105 mEq/L (ref 96–112)
Creatinine, Ser: 1.34 mg/dL — ABNORMAL HIGH (ref 0.40–1.20)
GFR: 37.65 mL/min — ABNORMAL LOW (ref >60.00)
Glucose, Bld: 76 mg/dL (ref 70–99)
Potassium: 4.1 mEq/L (ref 3.5–5.1)
Sodium: 138 mEq/L (ref 135–145)
Total Bilirubin: 0.6 mg/dL (ref 0.2–1.2)
Total Protein: 7.8 g/dL (ref 6.0–8.3)

## 2021-02-17 ENCOUNTER — Other Ambulatory Visit: Payer: Self-pay

## 2021-02-17 ENCOUNTER — Encounter: Payer: Self-pay | Admitting: Physician Assistant

## 2021-02-17 ENCOUNTER — Ambulatory Visit: Payer: Medicare Other | Admitting: Physician Assistant

## 2021-02-17 VITALS — BP 130/80 | HR 91 | Ht 67.0 in | Wt 171.6 lb

## 2021-02-17 DIAGNOSIS — I10 Essential (primary) hypertension: Secondary | ICD-10-CM | POA: Diagnosis not present

## 2021-02-17 DIAGNOSIS — I2510 Atherosclerotic heart disease of native coronary artery without angina pectoris: Secondary | ICD-10-CM

## 2021-02-17 DIAGNOSIS — I480 Paroxysmal atrial fibrillation: Secondary | ICD-10-CM

## 2021-02-17 DIAGNOSIS — I5020 Unspecified systolic (congestive) heart failure: Secondary | ICD-10-CM | POA: Diagnosis not present

## 2021-02-17 DIAGNOSIS — E7800 Pure hypercholesterolemia, unspecified: Secondary | ICD-10-CM

## 2021-02-17 MED ORDER — METOPROLOL SUCCINATE ER 50 MG PO TB24: 50.0000 mg | ORAL_TABLET | Freq: Every evening | ORAL | 11 refills | Status: DC

## 2021-02-17 MED ORDER — DAPAGLIFLOZIN PROPANEDIOL 10 MG PO TABS: 10.0000 mg | ORAL_TABLET | Freq: Every day | ORAL | 11 refills | Status: DC

## 2021-02-17 NOTE — Assessment & Plan Note (Signed)
The patient's blood pressure is controlled on her current regimen.  Continue current therapy.   

## 2021-02-17 NOTE — Assessment & Plan Note (Signed)
Non-ischemic cardiomyopathy.  EF 30-35.  NYHA II-IIb.  Volume status stable.  We discussed the importance of GDMT and the "4 Pillars" of HF treatment.  She is already on ARNI, SGLT2i and beta-blocker.  She is having trouble affording [place].  Her HR is in the 90s and I think she would benefit from further beta-blockade.  Eventually, we will try to get her on MRA.   Continue dapagliflozin 10 mg once daily, entresto 24/26 mg twice daily  Increase Metoprolol succinate to 50 mg once daily.  Pt will send BP readings in 2 weeks  If BP can tol, I will start Spironolactone 12.5 mg once daily and arrange f/u labs  Arrange financial assistance for Marcelline Deist and [name]  I will arrange a f/u echocardiogram once she is on max GDMT +/- change Entresto to ACE/ARB (due to cost)  F/u with Dr. Tenny Craw or me in 3 mos

## 2021-02-17 NOTE — Patient Instructions (Signed)
Medication Instructions:   INCREASE Toprol (1) one tablet by mouth ( 50 mg) every evening.  You can take (2) two of your (25mg ) tablets together and use them up.  *If you need a refill on your cardiac medications before your next appointment, please call your pharmacy*   Follow-Up: At [facility], you and your health needs are our priority.  As part of our continuing mission to provide you with exceptional heart care, we have created designated Provider Care Teams.  These Care Teams include your primary Cardiologist (physician) and Advanced Practice Providers (APPs -  Physician Assistants and Nurse Practitioners) who all work together to provide you with the care you need, when you need it.  We recommend signing up for the patient portal called "MyChart".  Sign up information is provided on this After Visit Summary.  MyChart is used to connect with patients for Virtual Visits (Telemedicine).  Patients are able to view lab/test results, encounter notes, upcoming appointments, etc.  Non-urgent messages can be sent to your provider as well.   To learn more about what you can do with MyChart, go to [email or web address].    Your next appointment:   3 month(s)  The format for your next appointment:   In Person  Provider:   Dorris Carnes, MD   Other Instructions  Please check blood pressure or have your aid check blood pressure 3-4 times weekly.  In two weeks send in readings please either mychart or call @ [phone].

## 2021-02-17 NOTE — Progress Notes (Signed)
Cardiology Office Note:    Date:  02/17/2021   ID:  Hannah [name], DOB 07-Dec-1940, MRN [id number]  PCP:  [name], MD  [facility] Providers Cardiologist:  Dorris Carnes, MD Cardiology APP:  Sharmon Revere    Referring MD: [name], MD   Chief Complaint:  F/u for CHF    Patient Profile: Prior massive pulmonary embolism  Lifelong anticoagulation (Warfarin) (HFrEF) heart failure with reduced ejection fraction  Non-ischemic cardiomyopathy  Previously managed for Diastolic CHF Echocardiogram 10/2018: EF 55-60, Gr 1 DD Echocardiogram 4/22: EF 45-50 Echocardiogram 12/22: EF 30-35, Gr 1 DD, mild MR, mod TR Coronary calcification on CT Myoview 12/2018: Low risk Cath 12/22: no sig CAD (prox to mid RCA 20) Paroxysmal atrial fibrillation  Noted during admx for COVID-19, new onset CHF, chest pain in 12/22 Diabetes mellitus  Hypertension  Hyperlipidemia  Hypothyroidism  Chronic kidney disease  Hx of large bowel stricture, s/p colectomy w colostomy  admx in 12/2017 w L breast hematoma and cellulitis; Hgb dropped from 11.5>>8.8 (INR = 3.48); Warfarin held for 2 weeks admx for Syncope in 10/2018; neg workup (CT, MRI, Echocardiogram); felt to be due to orthostatic hypotension; poor hydration  Hx of LGI bleed (?diverticular) in 04/2020   Prior CV Studies: Cardiac catheterization January 05, 2021 RCA prox to mid 20   Cardiac catheterization 12/30/20 Normal R heart pressures Unsuccessful L heart cath > returned 01-05-21   Echocardiogram 12/26/20 EF 30-35, ant septum/apex, mid inf-sept AK; ant-lat/ant/inf HK, Gr 1 DD, mild LVH, normal RVSF, RVSP 27.2, small effusion (ant to RV), mild MR, mod TR, AV sclerosis w/o AS   Echocardiogram 04/23/20 Inf HK, EF 45-50, mild LVH, Gr 1 DD   Myoview 12/16/2018 No ischemia or infarction, EF 57; Low Risk    Echocardiogram 10/31/2018 EF 55-60, Gr 1 DD, normal RVSF, mild MAC, mild MR, mild TR    History of Present Illness:    Hannah [name] is a 81 y.o. female with the above problem list.  She was admitted in 12/22 with elevated hs-Trop levels and newly depressed EF (30-35) in the setting of COVID-19 c/b a single episode of AF w RVR.  She had no significant CAD on cath.  She went to the ED in 1/23 with chest pain, shortness of breath and low BP from taking NTG.   Workup was unremarkable.  I saw her 01/12/21 for f/u.  We discussed GDMT for HFrEF but I opted to not change medications as she remained fairly weak.  She returns for f/u with eye towards either changing Metoprolol succinate to Carvedilol vs adding Spironolactone.  Once GDMT is maximized, she will need a f/u Echocardiogram to recheck EF.   She is here today with her daughter.  She is feeling better.  She is not having chest pain, significant shortness of breath, syncope.  She walks with a walker.  She sleeps on an incline chronically. She is having trouble affording [place].  She also notes her copays are expensive when she returns for f/u.       Past Medical History:  Diagnosis Date   Acute massive pulmonary embolism ([place]) 07/13/2012   Massive PE w/ PEA arrest 07/13/12 >TNK >IVC filter >discharged on comadin     Anticoagulated on warfarin    Chronic diastolic CHF (congestive heart failure) ([place]) 03/15/2017   Chronic kidney disease, stage 3a ([place]) 12/15/2018   Diabetes mellitus, type 2 ([place]) 07/15/2012   with peripheral neuropathy   DM2 (diabetes mellitus,  type 2) ([place]) 07/23/2012   HFrEF (heart failure with reduced ejection fraction) ([place]) 03/15/2017   Non-ischemic cardiomyopathy // Cardiac catheterization 01/01/21:  RCA prox to mid 20 // Echocardiogram 12/26/20:  EF 30-35, ant septum/apex, mid inf-sept AK; ant-lat/ant/inf HK, Gr 1 DD, mild LVH, normal RVSF, RVSP 27.2, small effusion (ant to RV), mild MR, mod TR, AV sclerosis w/o AS   Hyperlipemia 07/14/2012   Hyperlipidemia 02/25/2013   Hypertension 07/14/2012   Hypothyroid 06/06/2013   Large bowel  stricture ([place])    s/p colectomy in 2016   Neuropathy 02/12/2015   Osteoarthritis    s/p hip and knee replacements   PAF (paroxysmal atrial fibrillation) ([place])    Pulmonary hypertension ([place]) 07/26/2016   Syncope 10/2018   Thyroid disease    Current Medications: Current Meds  Medication Sig   acetaminophen (TYLENOL) 500 MG tablet Take 500 mg by mouth every 6 (six) hours as needed for mild pain.    atorvastatin (LIPITOR) 40 MG tablet Take 1 tablet (40 mg total) by mouth daily.   Blood Glucose Monitoring Suppl (ONETOUCH VERIO) w/Device KIT 1 each by Does not apply route as directed.   dapagliflozin propanediol (FARXIGA) 10 MG TABS tablet Take 1 tablet (10 mg total) by mouth daily before breakfast.   diclofenac Sodium (VOLTAREN) 1 % GEL Apply 4 g topically 4 (four) times daily.   furosemide (LASIX) 20 MG tablet Take 1 tablet (20 mg total) by mouth daily as needed for fluid or edema.   glucose blood (ONETOUCH VERIO) test strip USE 1 STRIP TO CHECK GLUCOSE ONCE DAILY AS DIRECTED   levothyroxine (EUTHYROX) 25 MCG tablet Take 1 tablet (25 mcg total) by mouth daily before breakfast.   metFORMIN (GLUCOPHAGE-XR) 500 MG 24 hr tablet TAKE 1 TABLET BY MOUTH ONCE DAILY WITH BREAKFAST. PATIENT NEEDS AN APPT.   OneTouch Delica Lancets 71I MISC USE 1  TO CHECK GLUCOSE ONCE DAILY   pantoprazole (PROTONIX) 40 MG tablet Take 1 tablet (40 mg total) by mouth daily.   sacubitril-valsartan (ENTRESTO) 24-26 MG Take 1 tablet by mouth 2 (two) times daily.   vitamin B-12 (CYANOCOBALAMIN) 1000 MCG tablet Take 1 tablet (1,000 mcg total) by mouth daily.   vitamin C (ASCORBIC ACID) 500 MG tablet Take 500 mg by mouth daily.    warfarin (COUMADIN) 5 MG tablet TAKE 1/2 TO 1 (ONE-HALF TO ONE) TABLET BY MOUTH DAILY AS DIRECTED BY  COUMADIN  CLINIC   [DISCONTINUED] metoprolol succinate (TOPROL-XL) 25 MG 24 hr tablet Take 1.5 tablets (37.5 mg total) by mouth every evening.    Allergies:   Augmentin [amoxicillin-pot  clavulanate], Tranxene [clorazepate], Crestor [rosuvastatin], and Penicillins   Social History   Tobacco Use   Smoking status: Former    Packs/day: 1.00    Years: 10.00    Pack years: 10.00    Types: Cigarettes    Quit date: 01/04/1968    Years since quitting: 53.1   Smokeless tobacco: Never  Vaping Use   Vaping Use: Never used  Substance Use Topics   Alcohol use: No   Drug use: No    Family Hx: The patient's family history includes Breast cancer in her mother.  ROS see HPI  EKGs/Labs/Other Test Reviewed:    EKG:  EKG is   ordered today.  The ekg ordered today demonstrates normal sinus rhythm, HR 91, LAFB, IVCD/LVH, QTc 437  Recent Labs: 08/19/2020: TSH 2.93 12/28/2020: Magnesium 2.0 01/08/2021: B Natriuretic Peptide 214.3 02/15/2021: ALT 8; BUN 21; Creatinine, Ser  1.34; Hemoglobin 13.3; Platelets 241.0; Potassium 4.1; Sodium 138   Recent Lipid Panel Recent Labs    02/15/21 1301  CHOL 150  TRIG 80.0  HDL 67.50  VLDL 16.0  LDLCALC 66     Risk Assessment/Calculations:    CHA2DS2-VASc Score = 9   This indicates a 12.2% annual risk of stroke. The patient's score is based upon: CHF History: 1 HTN History: 1 Diabetes History: 1 Stroke History: 2 Vascular Disease History: 1 Age Score: 2 Gender Score: 1        Physical Exam:    VS:  BP 130/80 (BP Location: Right Arm, Patient Position: Sitting, Cuff Size: Normal)    Pulse 91    Ht _0  (1.702 m)    Wt 171 lb 9.6 oz (77.8 kg)    SpO2 98%    BMI 26.88 kg/m     Wt Readings from Last 3 Encounters:  02/17/21 171 lb 9.6 oz (77.8 kg)  01/13/21 170 lb 6.4 oz (77.3 kg)  01/12/21 171 lb 9.6 oz (77.8 kg)    Constitutional:      Appearance: Healthy appearance. Not in distress.  Neck:     Vascular: No JVR.  Pulmonary:     Effort: Pulmonary effort is normal.     Breath sounds: No wheezing. No rales.  Cardiovascular:     Normal rate. Regular rhythm. Normal S1. Normal S2.      Murmurs: There is no murmur.  Edema:     Peripheral edema present.    Ankle: bilateral trace edema of the ankle. Abdominal:     Palpations: Abdomen is soft.  Skin:    General: Skin is warm and dry.  Neurological:     Mental Status: Alert and oriented to person, place and time.     Cranial Nerves: Cranial nerves are intact.        ASSESSMENT & PLAN:   HFrEF (heart failure with reduced ejection fraction) (HCC) Non-ischemic cardiomyopathy.  EF 30-35.  NYHA II-IIb.  Volume status stable.  We discussed the importance of GDMT and the "4 Pillars" of HF treatment.  She is already on ARNI, SGLT2i and beta-blocker.  She is having trouble affording [place].  Her HR is in the 90s and I think she would benefit from further beta-blockade.  Eventually, we will try to get her on MRA.  Continue dapagliflozin 10 mg once daily, entresto 24/26 mg twice daily Increase Metoprolol succinate to 50 mg once daily. Pt will send BP readings in 2 weeks If BP can tol, I will start Spironolactone 12.5 mg once daily and arrange f/u labs Arrange financial assistance for Wilder Glade and Delene Loll I will arrange a f/u echocardiogram once she is on max GDMT +/- change Entresto to ACE/ARB (due to cost) F/u with Dr. Harrington Challenger or me in 3 mos   PAF (paroxysmal atrial fibrillation) ([place]) Maintaining normal sinus rhythm.  She is on lifelong coumadin b/c of prior hx of massive pulmonary embolism.    CAD (coronary artery disease) Minimal nonobstructive CAD on recent cardiac catheterization.  She is not having anginal symptoms.  Continue atorvastatin 40 mg daily.  She is not on aspirin as she is on Warfarin.  Hypertension The patient's blood pressure is controlled on her current regimen.  Continue current therapy.   Hyperlipidemia Lipids optimal.  Continue atorvastatin 40 mg daily.         Dispo:  Return in about 3 months (around 05/17/2021) for Routine follow up in 3 months with  Dr.Ross. .   Medication Adjustments/Labs and Tests Ordered: Current medicines are  reviewed at length with the patient today.  Concerns regarding medicines are outlined above.  Tests Ordered: Orders Placed This Encounter  Procedures   EKG 12-Lead   Medication Changes: Meds ordered this encounter  Medications   dapagliflozin propanediol (FARXIGA) 10 MG TABS tablet    Sig: Take 1 tablet (10 mg total) by mouth daily before breakfast.    Dispense:  30 tablet    Refill:  11   metoprolol succinate (TOPROL-XL) 50 MG 24 hr tablet    Sig: Take 1 tablet (50 mg total) by mouth every evening.    Dispense:  30 tablet    Refill:  [address] [place], [place]  02/17/2021 4:57 PM    [facility] [address] [place] [address], [place]  [address] Phone: [phone]; Fax: [phone]

## 2021-02-17 NOTE — Assessment & Plan Note (Signed)
Maintaining normal sinus rhythm.  She is on lifelong coumadin b/c of prior hx of massive pulmonary embolism.

## 2021-02-17 NOTE — Assessment & Plan Note (Signed)
Minimal nonobstructive CAD on recent cardiac catheterization.  She is not having anginal symptoms.  Continue atorvastatin 40 mg daily.  She is not on aspirin as she is on Warfarin.

## 2021-02-17 NOTE — Assessment & Plan Note (Signed)
Lipids optimal.  Continue atorvastatin 40 mg daily. ?

## 2021-02-26 ENCOUNTER — Ambulatory Visit (INDEPENDENT_AMBULATORY_CARE_PROVIDER_SITE_OTHER): Payer: Medicare Other | Admitting: Family Medicine

## 2021-02-26 ENCOUNTER — Encounter: Payer: Self-pay | Admitting: Family Medicine

## 2021-02-26 VITALS — BP 130/80 | HR 71 | Temp 97.4°F | Ht 67.0 in | Wt 170.3 lb

## 2021-02-26 DIAGNOSIS — I5020 Unspecified systolic (congestive) heart failure: Secondary | ICD-10-CM | POA: Diagnosis not present

## 2021-02-26 DIAGNOSIS — N1831 Chronic kidney disease, stage 3a: Secondary | ICD-10-CM

## 2021-02-26 DIAGNOSIS — I10 Essential (primary) hypertension: Secondary | ICD-10-CM

## 2021-02-26 DIAGNOSIS — E1142 Type 2 diabetes mellitus with diabetic polyneuropathy: Secondary | ICD-10-CM | POA: Diagnosis not present

## 2021-02-26 DIAGNOSIS — I420 Dilated cardiomyopathy: Secondary | ICD-10-CM | POA: Diagnosis not present

## 2021-02-26 DIAGNOSIS — I480 Paroxysmal atrial fibrillation: Secondary | ICD-10-CM

## 2021-02-26 DIAGNOSIS — E7800 Pure hypercholesterolemia, unspecified: Secondary | ICD-10-CM

## 2021-02-26 DIAGNOSIS — Z23 Encounter for immunization: Secondary | ICD-10-CM | POA: Diagnosis not present

## 2021-02-26 DIAGNOSIS — E038 Other specified hypothyroidism: Secondary | ICD-10-CM | POA: Diagnosis not present

## 2021-02-26 MED ORDER — PANTOPRAZOLE SODIUM 40 MG PO TBEC: 40.0000 mg | DELAYED_RELEASE_TABLET | Freq: Every day | ORAL | 3 refills | Status: DC

## 2021-02-26 NOTE — Progress Notes (Signed)
Hannah [name] DOB: May 17, 1940 Encounter date: 02/26/2021  This is a 81 y.o. female who presents with Chief Complaint  Patient presents with   Follow-up    History of present illness:  Doing ok today. Had bp checked at home this morning and it was ok then. Has been checking at home and home pressures are looking good.nothing as high as pressure today here.   Daughter got number for patient assistance for farxiga and entresto but hasn't called yet.   Checks blood sugar every morning. Has been doing well. Recent A1C was 6.3.   She has been working on exercises at home daily. Trying to maintain strength. Uses walker to get around in home and if out of home. Plenty of room to maneuver walker through her house.   Allergies  Allergen Reactions   Augmentin [Amoxicillin-Pot Clavulanate] Itching and Other (See Comments)    Severe vaginal itching   Tranxene [Clorazepate] Itching   Crestor [Rosuvastatin]     Made her sick on stomach, she is able to tolerate the zocor   Penicillins Itching and Rash    Has patient had a PCN reaction causing immediate rash, facial/tongue/throat swelling, SOB or lightheadedness with hypotension: Yes Has patient had a PCN reaction causing severe rash involving mucus membranes or skin necrosis: No Has patient had a PCN reaction that required hospitalization: No Has patient had a PCN reaction occurring within the last 10 years: No If all of the above answers are "NO", then may proceed with Cephalosporin use.    Current Meds  Medication Sig   acetaminophen (TYLENOL) 500 MG tablet Take 500 mg by mouth every 6 (six) hours as needed for mild pain.    Blood Glucose Monitoring Suppl (ONETOUCH VERIO) w/Device KIT 1 each by Does not apply route as directed.   dapagliflozin propanediol (FARXIGA) 10 MG TABS tablet Take 1 tablet (10 mg total) by mouth daily before breakfast.   diclofenac Sodium (VOLTAREN) 1 % GEL Apply 4 g topically 4 (four) times daily.   furosemide  (LASIX) 20 MG tablet Take 1 tablet (20 mg total) by mouth daily as needed for fluid or edema.   glucose blood (ONETOUCH VERIO) test strip USE 1 STRIP TO CHECK GLUCOSE ONCE DAILY AS DIRECTED   levothyroxine (EUTHYROX) 25 MCG tablet Take 1 tablet (25 mcg total) by mouth daily before breakfast.   metFORMIN (GLUCOPHAGE-XR) 500 MG 24 hr tablet TAKE 1 TABLET BY MOUTH ONCE DAILY WITH BREAKFAST. PATIENT NEEDS AN APPT.   metoprolol succinate (TOPROL-XL) 50 MG 24 hr tablet Take 1 tablet (50 mg total) by mouth every evening.   OneTouch Delica Lancets 19Q MISC USE 1  TO CHECK GLUCOSE ONCE DAILY   pantoprazole (PROTONIX) 40 MG tablet Take 1 tablet (40 mg total) by mouth daily.   sacubitril-valsartan (ENTRESTO) 24-26 MG Take 1 tablet by mouth 2 (two) times daily.   vitamin B-12 (CYANOCOBALAMIN) 1000 MCG tablet Take 1 tablet (1,000 mcg total) by mouth daily.   vitamin C (ASCORBIC ACID) 500 MG tablet Take 500 mg by mouth daily.    warfarin (COUMADIN) 5 MG tablet TAKE 1/2 TO 1 (ONE-HALF TO ONE) TABLET BY MOUTH DAILY AS DIRECTED BY  COUMADIN  CLINIC    Review of Systems  Constitutional:  Negative for chills, fatigue and fever.  Respiratory:  Negative for cough, chest tightness, shortness of breath and wheezing.   Cardiovascular:  Negative for chest pain, palpitations and leg swelling.   Objective:  BP (!) 160/90 (BP Location: Left Arm,  Patient Position: Sitting, Cuff Size: Normal)    Pulse 71    Temp (!) 97.4 F (36.3 C) (Oral)    Ht '5\' 7"'  (1.702 m)    Wt 170 lb 4.8 oz (77.2 kg)    SpO2 97%    BMI 26.67 kg/m   Weight: 170 lb 4.8 oz (77.2 kg)   BP Readings from Last 3 Encounters:  02/26/21 (!) 160/90  02/17/21 130/80  01/13/21 130/68   Wt Readings from Last 3 Encounters:  02/26/21 170 lb 4.8 oz (77.2 kg)  02/17/21 171 lb 9.6 oz (77.8 kg)  01/13/21 170 lb 6.4 oz (77.3 kg)    Physical Exam Constitutional:      General: She is not in acute distress.    Appearance: She is well-developed.   Cardiovascular:     Rate and Rhythm: Normal rate and regular rhythm.     Heart sounds: Murmur heard.  Systolic murmur is present with a grade of 2/6.    No friction rub.  Pulmonary:     Effort: Pulmonary effort is normal. No respiratory distress.     Breath sounds: Normal breath sounds. No wheezing or rales.  Musculoskeletal:     Right lower leg: No edema.     Left lower leg: No edema.  Neurological:     Mental Status: She is alert and oriented to person, place, and time.  Psychiatric:        Behavior: Behavior normal.    Assessment/Plan  1. HFrEF (heart failure with reduced ejection fraction) ([place]) She is following with cardiology.  Discussed with her daughter to call for patient assistance and let us know if any concerns with being able to afford medications.  I can certainly help them through this process.  Ideally she will be able to start [facility] and continue on Toprol 50 mg, furosemide 20 mg, farxiga.  2. Primary hypertension Blood pressure slightly elevated today on initial check, but better on recheck.  At home has been monitoring closely and numbers are good.  3. Dilated cardiomyopathy ([place]) See above.  4. PAF (paroxysmal atrial fibrillation) (HCC) Rate controlled.  She is on Coumadin.  5. Type 2 diabetes mellitus with diabetic polyneuropathy, without long-term current use of insulin ([place]) Currently on Farxiga.  Blood sugars have been very well controlled.  We are going to stop metformin.  6. Other specified hypothyroidism Continue with levothyroxine 25 mcg daily.  7. Chronic kidney disease, stage 3a ([place]) Kidney function has been stable.  Continue to monitor.  8. Pure hypercholesterolemia Continue with Lipitor 40 mg daily.  9. Need for pneumococcal vaccination - Pneumococcal conjugate vaccine 20-valent (Prevnar 20)   Return in about 3 months (around 05/26/2021) for Chronic condition visit.     Micheline Rough, MD

## 2021-03-02 ENCOUNTER — Ambulatory Visit: Payer: Medicare Other

## 2021-03-02 ENCOUNTER — Other Ambulatory Visit: Payer: Self-pay

## 2021-03-02 DIAGNOSIS — Z5181 Encounter for therapeutic drug level monitoring: Secondary | ICD-10-CM

## 2021-03-02 DIAGNOSIS — I2699 Other pulmonary embolism without acute cor pulmonale: Secondary | ICD-10-CM

## 2021-03-02 LAB — POCT INR: INR: 4.5 — AB (ref 2.0–3.0)

## 2021-03-02 NOTE — Patient Instructions (Signed)
Description   Skip today's dosage of Warfarin, then start taking Warfarin 1/2 tablet daily except for 1 tablet on Mondays and Fridays. Keep a green leafy veggie in your diet 2-3 times a week. Recheck INR in 2 weeks.  Coumadin Clinic [phone].

## 2021-03-04 ENCOUNTER — Telehealth: Payer: Self-pay | Admitting: *Deleted

## 2021-03-04 NOTE — Telephone Encounter (Signed)
S/w pt will bring bp readings on 3/14 for [place] to review.  ?

## 2021-03-04 NOTE — Telephone Encounter (Signed)
-----   Message from Liliane Shi, [place] sent at 03/04/2021 12:31 PM EST ----- ?Can you ask her to bring in some BP readings and give to [place] when she comes in for coumadin on 3/14? ?Thanks  ? ?

## 2021-03-10 ENCOUNTER — Other Ambulatory Visit: Payer: Self-pay | Admitting: Family Medicine

## 2021-03-10 ENCOUNTER — Telehealth: Payer: Self-pay | Admitting: Family Medicine

## 2021-03-10 MED ORDER — FUROSEMIDE 20 MG PO TABS: 20.0000 mg | ORAL_TABLET | Freq: Every day | ORAL | 1 refills | Status: DC | PRN

## 2021-03-10 NOTE — Telephone Encounter (Signed)
Refill sent. Needs to be seen if not feeling better with lasix. ?

## 2021-03-10 NOTE — Telephone Encounter (Signed)
Patient informed of the message below.

## 2021-03-10 NOTE — Telephone Encounter (Signed)
Pt was sent to access nurse and she has heart trouble and her feet are swelling and pt unable to come in today and would like a refill on lasik. furosemide (LASIX) 20 MG tablet. Pt was last seen on 02-26-2021  ?[id number] - [place] ([place]), [place] - [address] Phone:  [phone]  ?Fax:  [phone]  ?  ? ?

## 2021-03-16 ENCOUNTER — Ambulatory Visit: Payer: Medicare Other

## 2021-03-16 ENCOUNTER — Telehealth: Payer: Self-pay

## 2021-03-16 ENCOUNTER — Ambulatory Visit: Payer: Medicare Other | Admitting: *Deleted

## 2021-03-16 ENCOUNTER — Other Ambulatory Visit: Payer: Self-pay

## 2021-03-16 DIAGNOSIS — I2699 Other pulmonary embolism without acute cor pulmonale: Secondary | ICD-10-CM | POA: Diagnosis not present

## 2021-03-16 DIAGNOSIS — Z5181 Encounter for therapeutic drug level monitoring: Secondary | ICD-10-CM

## 2021-03-16 LAB — POCT INR: INR: 3.5 — AB (ref 2.0–3.0)

## 2021-03-16 NOTE — Telephone Encounter (Signed)
This nurse attempted to call patient due to missed AWV. There was no answer and no voicemail to leave a message. ?

## 2021-03-16 NOTE — Patient Instructions (Signed)
Description   ?Continue taking Warfarin 1/2 tablet daily except for 1 tablet on Mondays and Fridays. Keep a green leafy veggie in your diet 2-3 times a week. Recheck INR in 3 weeks.  Coumadin Clinic [phone].  ?  ?  ?

## 2021-03-17 ENCOUNTER — Other Ambulatory Visit: Payer: Self-pay | Admitting: Internal Medicine

## 2021-03-17 NOTE — Telephone Encounter (Signed)
Prescription refill request received for warfarin ?Lov: 02/17/21 Alben Spittle) ?Next INR check: 04/06/21 ?Warfarin tablet strength: 5mg  ? ?Appropriate dose and refill sent to requested pharmacy.  ?

## 2021-03-23 ENCOUNTER — Telehealth: Payer: Self-pay | Admitting: *Deleted

## 2021-03-23 ENCOUNTER — Telehealth: Payer: Self-pay | Admitting: Pharmacist

## 2021-03-23 ENCOUNTER — Telehealth: Payer: Self-pay | Admitting: Physician Assistant

## 2021-03-23 DIAGNOSIS — I255 Ischemic cardiomyopathy: Secondary | ICD-10-CM

## 2021-03-23 MED ORDER — SPIRONOLACTONE 25 MG PO TABS: 12.5000 mg | ORAL_TABLET | Freq: Every day | ORAL | 3 refills | Status: DC

## 2021-03-23 NOTE — Telephone Encounter (Signed)
Call placed to pt regarding message below. ? ?She brought them but she said noone asked for them, so she didn't leave them. ? ?Here is the last 5 days.. ? ?3/17   135/70    HR 66 ?3/18   132/82    HR 71 ?3/19   130/60    HR 70 ?3/20   135/70    HR 66 ?3/21   128/67    HR 54 ?

## 2021-03-23 NOTE — Chronic Care Management (AMB) (Addendum)
? ? ?  Chronic Care Management ?Pharmacy Assistant  ? ?Name: Hannah [name]  MRN: [id number] DOB: 10-08-1940 ? ?03/24/2021 APPOINTMENT REMINDER ? ? ?[name] was reminded to have all medications, supplements and any blood glucose and blood pressure readings available for review with Gaylord Shih, Pharm. D, at her telephone visit on 03/24/2021 at 1:00. ? ? ?Questions: ?Have you had any recent office visit or specialist visit outside of [facility] systems? Patient denies any visits outside of [facility].  ? ?Are there any concerns you would like to discuss during your office visit? Patient denies any concerns at this time.  ? ?Are you having any problems obtaining your medications? (Whether it pharmacy issues or cost) Patient denies any issues getting her medications.  ? ?If patient has any PAP medications ask if they are having any problems getting their PAP medication or refill?  Patient denies getting medications from pap ?  ?Care Gaps: ?AWV - message sent to Carrie Mew ?Last BP - 130/80 on 02/26/2021 ?Shingrix - never done ?Covid-19 vaccine  - overdue  ?Flu vaccine - due ?Foot exam - overdue  ?  ?Star Rating Drugs: ?Atorvastatin 40 mg - last filled 01/27/2021 90 DS at [facility] ? ? ?Any gaps in medications fill history? No ? ?[name] CMA  ?Clinical Pharmacist Assistant ?[phone] ? ?

## 2021-03-23 NOTE — Telephone Encounter (Signed)
-----   Message from Beatrice Lecher, [place] sent at 03/23/2021  1:43 PM EDT ----- ?Pt was supposed to bring BP readings to Coumadin appt last week. ?Please ask pt if she brought those.  I have not seen them.  If not, ask her what her BP has been running lately. ?Tereso Newcomer, PA-C    ?03/23/2021 1:44 PM   ? ?

## 2021-03-23 NOTE — Telephone Encounter (Signed)
Her BP looks like she can tolerate Spironolactone which would maximize her treatment for congestive heart failure.   ?PLAN:  ?Start Spironolactone 12.5 mg once daily. ?BMET 1 week after starting and 2 weeks after starting. ?Schedule f/u Limited Echocardiogram 6-8 weeks after starting Spironolactone to recheck EF (Dx: cardiomyopathy).   ?Arrange f/u OV with Dr. Tenny Craw or me after echocardiogram. ?Tereso Newcomer, PA-C    ?03/23/2021 3:55 PM   ?

## 2021-03-23 NOTE — Addendum Note (Signed)
Addended by: Burnetta Sabin on: 03/23/2021 04:13 PM ? ? Modules accepted: Orders ? ?

## 2021-03-23 NOTE — Telephone Encounter (Signed)
Yes I am aware. ?I have requested that she get a BMET 1 week and 2 weeks after starting to monitor her potassium levels. ?Tereso Newcomer, PA-C    ?03/23/2021 5:25 PM   ?

## 2021-03-23 NOTE — Telephone Encounter (Signed)
Teacher, adult education from [place] to pharmacy. Verbalized understanding. ?

## 2021-03-23 NOTE — Telephone Encounter (Signed)
Pt c/o medication issue: ? ?1. Name of Medication: spironolactone (ALDACTONE) 25 MG tablet and sacubitril-valsartan (ENTRESTO) 24-26 MG ? ?2. How are you currently taking this medication (dosage and times per day)? Entresto as directed, has not started spironolactone ? ?3. Are you having a reaction (difficulty breathing--STAT)? no ? ?4. What is your medication issue? Macy at [facility] states that there is an interaction between these two medications. They can cause high potassium levels and wants to know if Tereso Newcomer, PA still wants to prescribe the patient Spironolactone. Please advise.   ?

## 2021-03-24 ENCOUNTER — Ambulatory Visit (INDEPENDENT_AMBULATORY_CARE_PROVIDER_SITE_OTHER): Payer: Medicare Other | Admitting: Pharmacist

## 2021-03-24 ENCOUNTER — Telehealth: Payer: Self-pay | Admitting: *Deleted

## 2021-03-24 DIAGNOSIS — I10 Essential (primary) hypertension: Secondary | ICD-10-CM

## 2021-03-24 DIAGNOSIS — I5020 Unspecified systolic (congestive) heart failure: Secondary | ICD-10-CM

## 2021-03-24 MED ORDER — DAPAGLIFLOZIN PROPANEDIOL 10 MG PO TABS: 10.0000 mg | ORAL_TABLET | Freq: Every day | ORAL | 3 refills | Status: DC

## 2021-03-24 NOTE — Telephone Encounter (Signed)
-----   Message from Verner Chol, [facility] sent at 03/24/2021  3:24 PM EDT ----- ?Regarding: Farxiga refill ?Hi, ? ?Can you please send a prescription for Farxiga for 90 days supply with a year of refills to [facility] in [place], [place]? I got [place] approved for patient assistance and all they need is a prescription. ? ?Thank you! ?Maddie ? ?

## 2021-03-24 NOTE — Patient Instructions (Signed)
Hi Hannah [name], ? ?It was great to catch up with you again! Keep on checking your blood pressures and blood sugars regularly like we discussed. ? ?Please reach out to me if you have any questions or need anything! ? ?Best, ?Maddie ? ?Jeni Salles, PharmD, BCACP ?Clinical Pharmacist ?Therapist, music at [place] ?[phone] ? ? Visit Information ? ? Goals Addressed   ?None ?  ? ?Patient Care Plan: [place]  ?  ? ?Problem Identified: Problem: Hypertension, Hyperlipidemia, Diabetes, Heart Failure, Chronic Kidney Disease, Hypothyroidism, Osteoarthritis, and GI bleed and neuropathy   ?  ? ?Long-Range Goal: Patient-Specific Goal   ?Start Date: 06/18/2020  ?Expected End Date: 06/18/2021  ?Recent Progress: On track  ?Priority: High  ?Note:   ?Current Barriers:  ?Unable to independently afford treatment regimen ?Unable to independently monitor therapeutic efficacy ? ?Pharmacist Clinical Goal(s):  ?Patient will verbalize ability to afford treatment regimen ?achieve adherence to monitoring guidelines and medication adherence to achieve therapeutic efficacy ?maintain control of blood pressure as evidenced by home blood pressure readings  through collaboration with PharmD and provider.  ? ?Interventions: ?1:1 collaboration with [name], MD regarding development and update of comprehensive plan of care as evidenced by provider attestation and co-signature ?Inter-disciplinary care team collaboration (see longitudinal plan of care) ?Comprehensive medication review performed; medication list updated in electronic medical record ? ?Hypertension (BP goal <140/90) ?-Controlled ?-Current treatment: ?Metoprolol succinate 50 mg 1 tablet once daily - Appropriate, Effective, Safe, Accessible ?Entresto 25-26 mg 1 tablet twice daily - Appropriate, Effective, Safe, Query accessible ?Spironolactone 25 mg 1/2 tablet daily -  not started ?-Medications previously tried: amlodipine, lisinopril  ?-Current home readings:  126/69 HR 52, 128/67 HR 54, 134/65 HR 77, 116/74 HR 63 ?-Current dietary habits: limits salt intake; daughters bring her supper every day; eats out sometimes ?-Current exercise habits: PT exercises every day (doesn't go down the steps every day ?-Denies hypotensive/hypertensive symptoms ?-Educated on Daily salt intake goal < 2300 mg; ?Importance of home blood pressure monitoring; ?Proper BP monitoring technique; ?Symptoms of hypotension and importance of maintaining adequate hydration; ?-Counseled to monitor BP at home daily, document, and provide log at future appointments ?-Counseled on diet and exercise extensively ?Recommended to continue current medication ? ?Hyperlipidemia: (LDL goal < 70) ?-Controlled ?-Current treatment: ?Atorvastatin 40 mg 1 tablet at bedtime - Appropriate, Effective, Safe, Accessible ?-Medications previously tried: rosuvastatin (side effects), simvastatin (not effective) ?-Current dietary patterns: eats a lot of fish and only some red meat; tries to boil instead of frying and tries not to cook with oil but does use vegetable oil - recommended olive or canola ?-Current exercise habits: PT exercises every day ?-Educated on Cholesterol goals;  ?Benefits of statin for ASCVD risk reduction; ?Importance of limiting foods high in cholesterol; ?-Counseled on diet and exercise extensively ?Recommended to continue current medication ? ?Diabetes (A1c goal <7%) ?-Controlled ?-Current medications: ?Farxiga 10 mg 1 tablet daily  - Appropriate, Effective, Safe, Accessible ?-Medications previously tried: metformin (not needed) ?-Current home glucose readings ?fasting glucose: 117, 121, 124 ?post prandial glucose: does not check ?-Denies hypoglycemic/hyperglycemic symptoms ?-Current meal patterns:  ?breakfast: did not discuss ?lunch: did not discuss  ?dinner: did not discuss ?snacks: did not discuss ?drinks: did not discuss ?-Current exercise: PT exercises every day ?-Educated on A1c and blood sugar  goals; ?Prevention and management of hypoglycemic episodes; ?Benefits of routine self-monitoring of blood sugar; ?Carbohydrate counting and/or plate method ?-Counseled to check feet daily and get yearly eye exams ?-Counseled on  diet and exercise extensively ?Recommended to continue current medication ?Recommended for patient to contact eye doctor to get report from last eye exam ? ?Heart Failure (Goal: manage symptoms and prevent exacerbations) ?-Controlled ?-Last ejection fraction: 45-50% (Date: 04/23/20) ?-HF type: Diastolic ?-NYHA Class: II (slight limitation of activity) ?-AHA HF Stage: B (Heart disease present - no symptoms present) ?-Current treatment: ?Furosemide 20 mg daily as needed - Appropriate, Effective, Safe, Accessible ?Metoprolol succinate 50 mg 1 tablet daily - Appropriate, Effective, Safe, Accessible ?Entresto 25-26 mg 1 tablet twice daily - Appropriate, Effective, Safe, Accessible ?Farxiga 10 mg 1 tablet daily - Appropriate, Effective, Safe, Accessible ?Spironolactone 25 mg 1/2 tablet daily - not started yet ?-Medications previously tried: none  ?-Current home BP/HR readings: refer to above ?-Current dietary habits: limiting salt intake ?-Current exercise habits: PT exercises ?-Educated on Benefits of medications for managing symptoms and prolonging life ?Importance of weighing daily; if you gain more than 3 pounds in one day or 5 pounds in one week, call cardiologist ?Importance of blood pressure control ?-Counseled on diet and exercise extensively ?Recommended to continue current medication ?Patient is only taking furosemide not even once a week ? ?Hypothyroidism (Goal: TSH  0.35-4.5) ?-Controlled ?-Current treatment  ?Euthyrox 25 mcg 1 tablet before breakfast - Appropriate, Effective, Safe, Accessible ?-Medications previously tried: none  ?-Recommended to continue current medication ?Counseled on importance of taking this medication on an empty stomach separate from all multivitamins and  supplements. ? ?Stomach protection (Goal: protect stomach after GI bleed) ?-Controlled ?-Current treatment  ?Pantoprazole 40 mg 1 tablet daily - Appropriate, Effective, Safe, Accessible ?-Medications previously tried: none  ?- Plan to reassess need at follow up. ? ?Osteoarthritis (Goal: minimize pain) ?-Controlled ?-Current treatment  ?Voltaren gel 1% apply as needed - Appropriate, Effective, Safe, Accessible ?Aspercreme as needed - Appropriate, Effective, Safe, Accessible ?Tylenol 500 mg 1 tablet as needed - Appropriate, Effective, Safe, Accessible ?-Medications previously tried: none  ?-Recommended to continue current medication ? ?History of PE (Goal: prevent blood clots) ?-Controlled ?-Current treatment  ?Warfarin 5 mg as directed by coumadin clinic - Appropriate, Effective, Safe, Accessible ?-Medications previously tried: none  ?-Recommended to continue current medication ?Counseled on maintaining consistent intake of greens from week to week ? ? ?Health Maintenance ?-Vaccine gaps: shingrix, COVID booster, influenza, pneumovax ?-Current therapy:  ?Vitamin C 500 mg daily ?Vitamin B12 1000 mcg daily ?-Educated on Cost vs benefit of each product must be carefully weighed by individual consumer ?-Patient is satisfied with current therapy and denies issues ?-Recommended to continue current medication ? ?Patient Goals/Self-Care Activities ?Patient will:  ?- take medications as prescribed ?check glucose daily, document, and provide at future appointments ?check blood pressure daily, document, and provide at future appointments ?weigh daily, and contact provider if weight gain of > 3 lbs in one day or > 5 lbs in one week ? ?Follow Up Plan: Telephone follow up appointment with care management team member scheduled for: 4 months ? ?  ?  ? ?Patient verbalizes understanding of instructions and care plan provided today and agrees to view in [place]. Active MyChart status confirmed with patient.   ?The pharmacy team will  reach out to the patient again over the next 7 days.  ? ?[name], RPH  ?

## 2021-03-24 NOTE — Telephone Encounter (Signed)
Per Verner Chol, RPH  Elliot Cousin, RMA ?Oh no! Is there any chance you can fax a hard copy rx to them instead? Their fax is [phone] ? ?Printed out rx and faxed to # above. ?

## 2021-03-24 NOTE — Progress Notes (Signed)
? ?Chronic Care Management ?Pharmacy Note ? ?03/24/2021 ?Name:  Hannah [name] MRN:  [id number] DOB:  1940-12-24 ? ?Summary: ?BP not at goal < 140/90 per home readings ?LDL at goal < 70 ?A1c at goal < 7% ? ?Recommendations/Changes made from today's visit: ?-Applied for and got approved for Farxiga patient assistance ?-Recommended regular continued BP monitoring at home ? ?Plan: ?Mail PAP for Entresto ?Follow up in 4 months ? ? ?Subjective: ?Hannah [name] is an 81 y.o. year old female who is a primary patient of Koberlein, Steele Berg, MD.  The CCM team was consulted for assistance with disease management and care coordination needs.   ? ?Engaged with patient by telephone for follow up visit in response to provider referral for pharmacy case management and/or care coordination services.  ? ?Consent to Services:  ?The patient was given information about Chronic Care Management services, agreed to services, and gave verbal consent prior to initiation of services.  Please see initial visit note for detailed documentation.  ? ?Patient Care Team: ?[name], MD as PCP - General (Family Medicine) ?Fay Records, MD as PCP - Cardiology (Cardiology) ?[name], [facility] as Pharmacist (Pharmacist) ?Sharmon Revere as Physician Assistant (Cardiology) ? ?Recent office visits: ?02/26/21 Micheline Rough MD: Patient presented for HFrEF and chronic conditions follow up. D/c'd metformin due to use of [place]. Administered Prevnar20. ? ?01/13/21 Micheline Rough MD: Patient presented for hospital follow up. Ordered labs to complete prior to follow up. Follow up in 1 month. ? ?Recent consult visits: ?03/23/21 Telephone encounter (cardiology): Prescribed spironolactone 12.5 mg once daily. Plan for BMET in 1 week and 2 weeks. Schedule ECHO for 6-8 weeks. ? ?03/16/21 Derrel Nip, RN (cardiology): Patient presented for anticoag visit. INR 3.5, goal 2.5-3.5. Continued 5 mg (5 mg x 1) every Mon, Fri; 2.5 mg (5 mg x 0.5) all  other days. ? ?02/17/21 Richardson Dopp, PA-C (cardiology): Patient presented for HFrEF follow up. Increased metoprolol to 50 mg once daily. ? ?01/12/21 Richardson Dopp, PA-C (cardiology): Patient presented for HFrEF follow up. No med changes. Follow up in 6 weeks. ? ?Hospital visits: ?Medication Reconciliation was completed by comparing discharge summary, patient?s EMR and Pharmacy list, and upon discussion with patient. ?  ?Presented to the [facility] ED for chest pain for 6 hours. ?  ?New?Medications Started at [facility] Discharge:?? ?-started the following medication  ?None ? ?  ?Medication Changes at Hospital Discharge: ?-Changed   ?None ?  ?Medications Discontinued at Hospital Discharge: ?-Stopped ?None ?  ?Medications that remain the same after Hospital Discharge:?? ?-All other medications will remain the same.   ? ?Medication Reconciliation was completed by comparing discharge summary, patient?s EMR and Pharmacy list, and upon discussion with patient. ?  ?Admitted to the hospital on 12/25/2020 due to Chest pain and COVID infection. Discharge date was 01/04/2021. Discharged from [facility].   ?  ?New?Medications Started at [facility] Discharge:?? ?-started the following medications: ?Entresto 24-26 mg 1 tablet twice daily ?Farxiga 10 mg 1 tablet daily ?Atorvastatin 40 mg 1 tablet daily ?  ?Medication Changes at Hospital Discharge: ?-Changed  None ?  ?Medications Discontinued at Hospital Discharge: ?-Stopped ?Lisinopril 5 MG tablet (ZESTRIL) ?Simvastatin 20 mg tablet ?Metoprolol tartrate 25 mg tablet ?  ?Medications that remain the same after Hospital Discharge:?? ?-All other medications will remain the same.   ? ? ?Objective: ? ?Lab Results  ?Component Value Date  ? CREATININE 1.34 (H) 02/15/2021  ? BUN 21 02/15/2021  ?  GFR 37.65 (L) 02/15/2021  ? GFRNONAA 36 (L) 01/08/2021  ? GFRAA 45 (L) 12/03/2019  ? NA 138 02/15/2021  ? K 4.1 02/15/2021  ? CALCIUM 9.6 02/15/2021  ? CO2 26 02/15/2021   ? GLUCOSE 76 02/15/2021  ? ? ?Lab Results  ?Component Value Date/Time  ? HGBA1C 6.3 02/15/2021 01:01 PM  ? HGBA1C 6.7 (H) 08/19/2020 03:16 PM  ? GFR 37.65 (L) 02/15/2021 01:01 PM  ? GFR 53.04 (L) 08/19/2020 03:16 PM  ? MICROALBUR 4.3 (H) 09/12/2018 03:07 PM  ? MICROALBUR 6.2 (H) 04/10/2015 09:01 AM  ?  ?Last diabetic Eye exam:  ?Lab Results  ?Component Value Date/Time  ? HMDIABEYEEXA No Retinopathy 06/23/2015 12:00 AM  ?  ?Last diabetic Foot exam: No results found for: HMDIABFOOTEX  ? ?Lab Results  ?Component Value Date  ? CHOL 150 02/15/2021  ? HDL 67.50 02/15/2021  ? [place] 66 02/15/2021  ? TRIG 80.0 02/15/2021  ? CHOLHDL 2 02/15/2021  ? ? ? ?  Latest Ref Rng & Units 02/15/2021  ?  1:01 PM 01/08/2021  ?  8:15 AM 12/28/2020  ?  1:45 AM  ?Hepatic Function  ?Total Protein 6.0 - 8.3 g/dL 7.8   5.8   5.8    ?Albumin 3.5 - 5.2 g/dL 4.3   2.8   2.6    ?AST 0 - 37 U/L '16   18   30    ' ?ALT 0 - 35 U/L '8   13   13    ' ?Alk Phosphatase 39 - 117 U/L 57   42   42    ?Total Bilirubin 0.2 - 1.2 mg/dL 0.6   1.0   0.4    ? ? ?Lab Results  ?Component Value Date/Time  ? TSH 2.93 08/19/2020 03:16 PM  ? TSH 1.48 10/30/2019 11:09 AM  ? FREET4 1.09 02/25/2013 12:23 PM  ? ? ? ?  Latest Ref Rng & Units 02/15/2021  ?  1:01 PM 01/08/2021  ?  8:21 AM 01/08/2021  ?  8:15 AM  ?CBC  ?WBC 4.0 - 10.5 K/uL 9.2    8.8    ?Hemoglobin 12.0 - 15.0 g/dL 13.3   13.6   12.1    ?Hematocrit 36.0 - 46.0 % 43.1   40.0   39.6    ?Platelets 150.0 - 400.0 K/uL 241.0    283    ? ? ?No results found for: VD25OH ? ?Clinical ASCVD: No  ?The ASCVD Risk score (Arnett DK, et al., 2019) failed to calculate for the following reasons: ?  The patient has a prior MI or stroke diagnosis   ? ? ?  01/13/2021  ? 11:33 AM 03/10/2020  ? 11:22 AM 10/30/2019  ? 10:32 AM  ?Depression screen PHQ 2/9  ?Decreased Interest 0 0 0  ?Down, Depressed, Hopeless 0 0 0  ?PHQ - 2 Score 0 0 0  ?Altered sleeping 0    ?Tired, decreased energy 0    ?Change in appetite 0    ?Feeling bad or failure about  yourself  3    ?Suicidal thoughts 0    ?PHQ-9 Score 3    ? ? ?Social History  ? ?Tobacco Use  ?Smoking Status Former  ? Packs/day: 1.00  ? Years: 10.00  ? Pack years: 10.00  ? Types: Cigarettes  ? Quit date: 01/04/1968  ? Years since quitting: 53.2  ?Smokeless Tobacco Never  ? ?BP Readings from Last 3 Encounters:  ?02/26/21 130/80  ?02/17/21 130/80  ?01/13/21 130/68  ? ?  Pulse Readings from Last 3 Encounters:  ?02/26/21 71  ?02/17/21 91  ?01/13/21 (!) 103  ? ?Wt Readings from Last 3 Encounters:  ?02/26/21 170 lb 4.8 oz (77.2 kg)  ?02/17/21 171 lb 9.6 oz (77.8 kg)  ?01/13/21 170 lb 6.4 oz (77.3 kg)  ? ?BMI Readings from Last 3 Encounters:  ?02/26/21 26.67 kg/m?  ?02/17/21 26.88 kg/m?  ?01/13/21 26.69 kg/m?  ? ? ?Assessment/Interventions: Review of patient past medical history, allergies, medications, health status, including review of consultants reports, laboratory and other test data, was performed as part of comprehensive evaluation and provision of chronic care management services.  ? ?SDOH:  (Social Determinants of Health) assessments and interventions performed: No ? ? ? ? ?SDOH Screenings  ? ?Alcohol Screen: Not on file  ?Depression (PHQ2-9): Low Risk   ? PHQ-2 Score: 3  ?Financial Resource Strain: Low Risk   ? Difficulty of Paying Living Expenses: Not hard at all  ?Food Insecurity: Not on file  ?Housing: Not on file  ?Physical Activity: Not on file  ?Social Connections: Not on file  ?Stress: Not on file  ?Tobacco Use: Medium Risk  ? Smoking Tobacco Use: Former  ? Smokeless Tobacco Use: Never  ? Passive Exposure: Not on file  ?Transportation Needs: No Transportation Needs  ? Lack of Transportation (Medical): No  ? Lack of Transportation (Non-Medical): No  ? ?CCM Care Plan ? ?Allergies  ?Allergen Reactions  ? Augmentin [Amoxicillin-Pot Clavulanate] Itching and Other (See Comments)  ?  Severe vaginal itching  ? Tranxene [Clorazepate] Itching  ? Crestor [Rosuvastatin]   ?  Made her sick on stomach, she is able to  tolerate the zocor  ? Penicillins Itching and Rash  ?  Has patient had a PCN reaction causing immediate rash, facial/tongue/throat swelling, SOB or lightheadedness with hypotension: Yes ?Has patient had a PCN

## 2021-03-24 NOTE — Addendum Note (Signed)
Addended by: Burnetta Sabin on: 03/24/2021 04:38 PM ? ? Modules accepted: Orders ? ?

## 2021-03-24 NOTE — Telephone Encounter (Signed)
Was attempting to send in Prescription for Marcelline Deist, as requested, cannot find the pharmacy in our directory. ?

## 2021-03-31 ENCOUNTER — Other Ambulatory Visit: Payer: Medicare Other

## 2021-03-31 ENCOUNTER — Telehealth: Payer: Self-pay | Admitting: Pharmacist

## 2021-03-31 NOTE — Chronic Care Management (AMB) (Signed)
? ? ?  Chronic Care Management ?Pharmacy Assistant  ? ?Name: Hannah [name]  MRN: [id number] DOB: Aug 09, 1940 ? ?Reason for Encounter: Complete PAP for Entresto.  ?Completed, to be mailed to patient to complete and take to prescribing physician.  ?  ?Gennie Alma CMA  ?Clinical Pharmacist Assistant ?[phone] ? ?

## 2021-04-02 DIAGNOSIS — I10 Essential (primary) hypertension: Secondary | ICD-10-CM | POA: Diagnosis not present

## 2021-04-02 DIAGNOSIS — I5020 Unspecified systolic (congestive) heart failure: Secondary | ICD-10-CM | POA: Diagnosis not present

## 2021-04-06 ENCOUNTER — Other Ambulatory Visit: Payer: Medicare Other | Admitting: *Deleted

## 2021-04-06 ENCOUNTER — Ambulatory Visit (INDEPENDENT_AMBULATORY_CARE_PROVIDER_SITE_OTHER): Payer: Medicare Other

## 2021-04-06 DIAGNOSIS — I2699 Other pulmonary embolism without acute cor pulmonale: Secondary | ICD-10-CM | POA: Diagnosis not present

## 2021-04-06 DIAGNOSIS — Z5181 Encounter for therapeutic drug level monitoring: Secondary | ICD-10-CM

## 2021-04-06 DIAGNOSIS — I255 Ischemic cardiomyopathy: Secondary | ICD-10-CM

## 2021-04-06 LAB — POCT INR: INR: 2.6 (ref 2.0–3.0)

## 2021-04-06 NOTE — Patient Instructions (Signed)
Description   ?Continue taking Warfarin 1/2 tablet daily except for 1 tablet on Mondays and Fridays. Keep a green leafy veggie in your diet 2-3 times a week. Recheck INR in 4 weeks.  Coumadin Clinic [phone].  ?  ?  ?

## 2021-04-07 LAB — BASIC METABOLIC PANEL
BUN/Creatinine Ratio: 15 (ref 12–28)
BUN: 23 mg/dL (ref 8–27)
CO2: 19 mmol/L — ABNORMAL LOW (ref 20–29)
Calcium: 9.8 mg/dL (ref 8.7–10.3)
Chloride: 107 mmol/L — ABNORMAL HIGH (ref 96–106)
Creatinine, Ser: 1.56 mg/dL — ABNORMAL HIGH (ref 0.57–1.00)
Glucose: 82 mg/dL (ref 70–99)
Potassium: 5.2 mmol/L (ref 3.5–5.2)
Sodium: 143 mmol/L (ref 134–144)
eGFR: 34 mL/min/{1.73_m2} — ABNORMAL LOW (ref >59)

## 2021-04-14 ENCOUNTER — Telehealth: Payer: Self-pay | Admitting: Pharmacist

## 2021-04-14 ENCOUNTER — Telehealth: Payer: Self-pay | Admitting: *Deleted

## 2021-04-14 DIAGNOSIS — Z79899 Other long term (current) drug therapy: Secondary | ICD-10-CM

## 2021-04-14 NOTE — Chronic Care Management (AMB) (Signed)
? ? ?  Chronic Care Management ?Pharmacy Assistant  ? ?Name: Semaja Erno  MRN: [id number] DOB: 06-12-1940 ? ?Reason for Encounter: Follow up [facility] patient assistance application. ?This was mailed to patient on 03/31/2021. Follow up with patient to be sure she has received it and mailed back to prescribing physician (Dr.Koberlein). ?Spoke with patient, she has not received it yet, she states she only gets her mail about once per week, she is unable to go to the mailbox and her family brings her mail to her weekly. I will check back with Helga in a week or two.  ?  ? ?Gennie Alma CMA  ?Clinical Pharmacist Assistant ?[phone] ? ?

## 2021-04-14 NOTE — Telephone Encounter (Signed)
-----   Message from Macie Burows, RN sent at 04/13/2021 12:50 PM EDT ----- ? ?----- Message ----- ?From: Beatrice Lecher, PA-C ?Sent: 04/07/2021   4:59 PM EDT ?To: Cv [facility] Triage ? ?Creatinine slightly increased.  K+ normal. ?PLAN:  ?-Continue current medications ?-Make sure patient has follow-up BMET scheduled for next week ?Tereso Newcomer, PA-C    ?04/07/2021 4:57 PM   ? ?

## 2021-04-16 ENCOUNTER — Other Ambulatory Visit: Payer: Medicare Other | Admitting: *Deleted

## 2021-04-16 DIAGNOSIS — Z79899 Other long term (current) drug therapy: Secondary | ICD-10-CM

## 2021-04-16 LAB — BASIC METABOLIC PANEL
BUN/Creatinine Ratio: 23 (ref 12–28)
BUN: 30 mg/dL — ABNORMAL HIGH (ref 8–27)
CO2: 18 mmol/L — ABNORMAL LOW (ref 20–29)
Calcium: 8.9 mg/dL (ref 8.7–10.3)
Chloride: 105 mmol/L (ref 96–106)
Creatinine, Ser: 1.29 mg/dL — ABNORMAL HIGH (ref 0.57–1.00)
Glucose: 106 mg/dL — ABNORMAL HIGH (ref 70–99)
Potassium: 4.4 mmol/L (ref 3.5–5.2)
Sodium: 138 mmol/L (ref 134–144)
eGFR: 42 mL/min/{1.73_m2} — ABNORMAL LOW (ref >59)

## 2021-04-19 NOTE — Progress Notes (Signed)
Pt has been made aware of normal result and verbalized understanding.  jw

## 2021-04-21 ENCOUNTER — Ambulatory Visit (INDEPENDENT_AMBULATORY_CARE_PROVIDER_SITE_OTHER): Payer: Medicare Other | Admitting: Family Medicine

## 2021-04-21 ENCOUNTER — Encounter: Payer: Self-pay | Admitting: Family Medicine

## 2021-04-21 VITALS — BP 130/78 | HR 81 | Temp 97.6°F | Ht 67.0 in | Wt 185.5 lb

## 2021-04-21 DIAGNOSIS — E1142 Type 2 diabetes mellitus with diabetic polyneuropathy: Secondary | ICD-10-CM

## 2021-04-21 DIAGNOSIS — E038 Other specified hypothyroidism: Secondary | ICD-10-CM

## 2021-04-21 DIAGNOSIS — I480 Paroxysmal atrial fibrillation: Secondary | ICD-10-CM | POA: Diagnosis not present

## 2021-04-21 DIAGNOSIS — N1831 Chronic kidney disease, stage 3a: Secondary | ICD-10-CM | POA: Diagnosis not present

## 2021-04-21 DIAGNOSIS — E7800 Pure hypercholesterolemia, unspecified: Secondary | ICD-10-CM

## 2021-04-21 DIAGNOSIS — I10 Essential (primary) hypertension: Secondary | ICD-10-CM

## 2021-04-21 DIAGNOSIS — I2510 Atherosclerotic heart disease of native coronary artery without angina pectoris: Secondary | ICD-10-CM | POA: Diagnosis not present

## 2021-04-21 NOTE — Patient Instructions (Addendum)
Just mail SCAT bus papers to me. I will get these done for you.  ? ?Return in 21month to repeat A1C (or ask home nurse if they can get this lab on you) if any increase in sugars, but otherwise we can just recheck this at your next visit.  ?

## 2021-04-21 NOTE — Progress Notes (Addendum)
?Hannah [name] ?DOB: Aug 02, 1940 ?Encounter date: 04/21/2021 ? ?This is a 81 y.o. female who presents with ?Chief Complaint  ?Patient presents with  ? Follow-up  ? ? ?History of present illness: ?Stable BMP completed 04/16/21. ? ?Last visit with me was 02/22/2021.  At that visit daughter was about to call for patient assistance for [place] as well as Entresto.  Patient was not sure what occurred with this, but in review of pharmacy notes, approval for both should be coming.  We will check with pharmacy. ? ?Diabetes type 2: On Farxiga.  Metformin was stopped at last visit. Getting sugars daily at home. Most are in the 100's, 120's.  ? ?Hypertension: Toprol 50 mg daily, Entresto 24-26 mg daily, spironolactone 12.5 mg daily.  Blood pressure well controlled today. ? ?Paroxysmal A-fib: On Coumadin, Toprol. ?Hypothyroid: Synthroid 25 mcg daily ?Chronic kidney disease stage IIIa: Kidney function has been stable ?Hyperlipidemia: Lipitor 40 mg daily.  She tolerates this well. ? ?Allergies  ?Allergen Reactions  ? Augmentin [Amoxicillin-Pot Clavulanate] Itching and Other (See Comments)  ?  Severe vaginal itching  ? Tranxene [Clorazepate] Itching  ? Crestor [Rosuvastatin]   ?  Made her sick on stomach, she is able to tolerate the zocor  ? Penicillins Itching and Rash  ?  Has patient had a PCN reaction causing immediate rash, facial/tongue/throat swelling, SOB or lightheadedness with hypotension: Yes ?Has patient had a PCN reaction causing severe rash involving mucus membranes or skin necrosis: No ?Has patient had a PCN reaction that required hospitalization: No ?Has patient had a PCN reaction occurring within the last 10 years: No ?If all of the above answers are "NO", then may proceed with Cephalosporin use. ?  ? ?Current Meds  ?Medication Sig  ? acetaminophen (TYLENOL) 500 MG tablet Take 500 mg by mouth every 6 (six) hours as needed for mild pain.   ? Blood Glucose Monitoring Suppl (ONETOUCH VERIO) w/Device KIT 1 each by Does not  apply route as directed.  ? dapagliflozin propanediol (FARXIGA) 10 MG TABS tablet Take 1 tablet (10 mg total) by mouth daily before breakfast.  ? diclofenac Sodium (VOLTAREN) 1 % GEL Apply 4 g topically 4 (four) times daily.  ? furosemide (LASIX) 20 MG tablet Take 1 tablet (20 mg total) by mouth daily as needed for fluid or edema.  ? glucose blood (ONETOUCH VERIO) test strip USE 1 STRIP TO CHECK GLUCOSE ONCE DAILY AS DIRECTED  ? levothyroxine (EUTHYROX) 25 MCG tablet Take 1 tablet (25 mcg total) by mouth daily before breakfast.  ? OneTouch Delica Lancets 16X MISC USE 1  TO CHECK GLUCOSE ONCE DAILY  ? pantoprazole (PROTONIX) 40 MG tablet Take 1 tablet (40 mg total) by mouth daily.  ? sacubitril-valsartan (ENTRESTO) 24-26 MG Take 1 tablet by mouth 2 (two) times daily.  ? spironolactone (ALDACTONE) 25 MG tablet Take 0.5 tablets (12.5 mg total) by mouth daily.  ? vitamin B-12 (CYANOCOBALAMIN) 1000 MCG tablet Take 1 tablet (1,000 mcg total) by mouth daily.  ? vitamin C (ASCORBIC ACID) 500 MG tablet Take 500 mg by mouth daily.   ? warfarin (COUMADIN) 5 MG tablet TAKE 1/2 TO 1 (ONE-HALF TO ONE) TABLET BY MOUTH ONCE DAILY AS  DIRECTED  BY  COUMADIN  CLINIC  ? ? ?Review of Systems  ?Constitutional:  Negative for chills, fatigue and fever.  ?Respiratory:  Negative for cough, chest tightness, shortness of breath and wheezing.   ?Cardiovascular:  Negative for chest pain, palpitations and leg swelling.  ?Neurological:  Weakness: feels she is getting stronger. working on exercises daily.  ? ?Objective: ? ?BP 130/78   Pulse 81   Temp 97.6 ?F (36.4 ?C) (Oral)   Ht '5\' 7"'  (1.702 m)   Wt 185 lb 8 oz (84.1 kg)   SpO2 98%   BMI 29.05 kg/m?   Weight: 185 lb 8 oz (84.1 kg)  ? ?BP Readings from Last 3 Encounters:  ?04/21/21 130/78  ?02/26/21 130/80  ?02/17/21 130/80  ? ?Wt Readings from Last 3 Encounters:  ?04/21/21 185 lb 8 oz (84.1 kg)  ?02/26/21 170 lb 4.8 oz (77.2 kg)  ?02/17/21 171 lb 9.6 oz (77.8 kg)  ? ? ?Physical  Exam ?Constitutional:   ?   General: She is not in acute distress. ?   Appearance: She is well-developed.  ?HENT:  ?   Head: Normocephalic and atraumatic.  ?Cardiovascular:  ?   Rate and Rhythm: Normal rate and regular rhythm.  ?   Heart sounds: Normal heart sounds. No murmur heard. ?Pulmonary:  ?   Effort: Pulmonary effort is normal.  ?   Breath sounds: Normal breath sounds.  ?Abdominal:  ?   General: Bowel sounds are normal. There is no distension.  ?   Palpations: Abdomen is soft.  ?   Tenderness: There is no abdominal tenderness. There is no guarding.  ?Feet:  ?   Comments: Normal bilateral monofilament foot exam.  Mild heel callus.  Skin is intact. Toenails are long. She has podiatrist and can schedule with them. Too difficult for her to trim on own due to thickness, bending required. ?Skin: ?   General: Skin is warm and dry.  ?   Comments: Sensory exam of the foot is normal, tested with the monofilament. Good pulses, no lesions or ulcers, good peripheral pulses. ? ?Venous stasis dermatitis  ?Psychiatric:     ?   Judgment: Judgment normal.  ? ? ?Assessment/Plan ? ?1. Type 2 diabetes mellitus with diabetic polyneuropathy, without long-term current use of insulin ([place]) ?She is on [place] for cardiac reasons, but this is also helping to control blood sugar. ?- Hemoglobin A1c; Future ? ?2. Primary hypertension ?Blood pressure well controlled.  Continue Entresto 24-26 mg daily, spironolactone 12.5 mg daily, Toprol 50 mg daily. ? ?3. Coronary artery disease involving native coronary artery of native heart without angina pectoris ?Currently on Lipitor 40 mg daily.  Follows with cardiology. ? ?4. PAF (paroxysmal atrial fibrillation) ([place]) ?Anticoagulated on Coumadin, rate controlled on Toprol. ? ?5. Other specified hypothyroidism ?Levothyroxine 25 mcg daily. ? ?6. Chronic kidney disease, stage 3a ([place]) ?We will continue to monitor.  Has been stable. ? ?7. Pure hypercholesterolemia ?On Lipitor. ? ? ? ?Return in about 4  months (around 08/21/2021) for Chronic condition visit. ?Patient prefers to have a one month follow up and will schedule to touch base on chronic conditions/needs. She has paperwork from [employer] that she will drop off for me to complete. She would like to have this option for her independence. ? ? ?Micheline Rough, MD ?

## 2021-04-28 ENCOUNTER — Telehealth: Payer: Self-pay | Admitting: Pharmacist

## 2021-04-28 NOTE — Chronic Care Management (AMB) (Signed)
    Chronic Care Management Pharmacy Assistant   Name: Giannie Soliday  MRN: [id number] DOB: [birth year]  Reason for Encounter: Follow up patient assistance for Entresto.  Spoke with Rosey Bath at [employer], she states she has no record of patient. Spoke with patient, she states she has never received the application for Entresto.  Application will be remailed to patient.    [name] [facility]  Clinical Pharmacist Assistant [phone]

## 2021-05-04 ENCOUNTER — Ambulatory Visit: Payer: Medicare Other | Admitting: Pharmacist

## 2021-05-04 DIAGNOSIS — I2699 Other pulmonary embolism without acute cor pulmonale: Secondary | ICD-10-CM | POA: Diagnosis not present

## 2021-05-04 DIAGNOSIS — Z5181 Encounter for therapeutic drug level monitoring: Secondary | ICD-10-CM

## 2021-05-04 LAB — POCT INR: INR: 3 (ref 2.0–3.0)

## 2021-05-04 NOTE — Patient Instructions (Signed)
Continue taking Warfarin 1/2 tablet daily except for 1 tablet on Mondays and Fridays. Keep a green leafy veggie in your diet 2-3 times a week. Recheck INR in 4 weeks.  [facility] [phone].  ?

## 2021-05-05 ENCOUNTER — Ambulatory Visit (HOSPITAL_COMMUNITY): Payer: Medicare Other | Attending: Cardiovascular Disease

## 2021-05-05 ENCOUNTER — Encounter (HOSPITAL_COMMUNITY): Payer: Self-pay

## 2021-05-05 NOTE — Progress Notes (Signed)
Verified appointment "no show" status with G. Andrews at 11:34.  ?

## 2021-05-18 ENCOUNTER — Other Ambulatory Visit: Payer: Self-pay | Admitting: Family Medicine

## 2021-05-21 ENCOUNTER — Encounter: Payer: Self-pay | Admitting: Family Medicine

## 2021-05-21 ENCOUNTER — Ambulatory Visit (INDEPENDENT_AMBULATORY_CARE_PROVIDER_SITE_OTHER): Payer: Medicare Other | Admitting: Family Medicine

## 2021-05-21 ENCOUNTER — Encounter: Payer: Self-pay | Admitting: Internal Medicine

## 2021-05-21 ENCOUNTER — Ambulatory Visit: Payer: Medicare Other | Admitting: Internal Medicine

## 2021-05-21 VITALS — BP 128/64 | HR 65 | Temp 97.4°F | Ht 67.0 in | Wt 187.3 lb

## 2021-05-21 VITALS — BP 116/63 | HR 67 | Ht 67.0 in | Wt 188.6 lb

## 2021-05-21 DIAGNOSIS — B351 Tinea unguium: Secondary | ICD-10-CM

## 2021-05-21 DIAGNOSIS — I480 Paroxysmal atrial fibrillation: Secondary | ICD-10-CM

## 2021-05-21 DIAGNOSIS — I10 Essential (primary) hypertension: Secondary | ICD-10-CM | POA: Diagnosis not present

## 2021-05-21 DIAGNOSIS — I2510 Atherosclerotic heart disease of native coronary artery without angina pectoris: Secondary | ICD-10-CM

## 2021-05-21 DIAGNOSIS — I420 Dilated cardiomyopathy: Secondary | ICD-10-CM | POA: Diagnosis not present

## 2021-05-21 DIAGNOSIS — E1142 Type 2 diabetes mellitus with diabetic polyneuropathy: Secondary | ICD-10-CM | POA: Diagnosis not present

## 2021-05-21 MED ORDER — ENTRESTO 24-26 MG PO TABS: 1.0000 | ORAL_TABLET | Freq: Two times a day (BID) | ORAL | 1 refills | Status: DC

## 2021-05-21 NOTE — Patient Instructions (Signed)
Take the lasix daily for the next 3-5 days until you feel that swelling is better. Use compression stockings when able and try to keep feet elevated with sitting or lying (can elevate on pillow in bed to get above heart level).

## 2021-05-21 NOTE — Progress Notes (Signed)
Hannah [name] DOB: 1940-10-30 Encounter date: 05/21/2021  This is a 81 y.o. female who presents with Chief Complaint  Patient presents with   Follow-up    History of present illness: Last visit with me was 04/21/2021.  Diabetes type 2: On Farxiga.  Metformin was stopped at last visit. Getting sugars daily at home. Most are in the 100's, 120's.    Hypertension: Toprol 50 mg daily, Entresto 24-26 mg daily, spironolactone 12.5 mg daily.  Blood pressure well controlled today.   Paroxysmal A-fib: On Coumadin, Toprol. Hypothyroid: Synthroid 25 mcg daily Chronic kidney disease stage IIIa: Kidney function has been stable Hyperlipidemia: Lipitor 40 mg daily.  She tolerates this well.  Had big birthday party on Saturday. There were at least 100 people there. They had a special queens chair for her and light that said 38.   Strength wise she is doing well. Still doing her exercises every day.   Allergies  Allergen Reactions   Augmentin [Amoxicillin-Pot Clavulanate] Itching and Other (See Comments)    Severe vaginal itching   Tranxene [Clorazepate] Itching   Crestor [Rosuvastatin]     Made her sick on stomach, she is able to tolerate the zocor   Penicillins Itching and Rash    Has patient had a PCN reaction causing immediate rash, facial/tongue/throat swelling, SOB or lightheadedness with hypotension: Yes Has patient had a PCN reaction causing severe rash involving mucus membranes or skin necrosis: No Has patient had a PCN reaction that required hospitalization: No Has patient had a PCN reaction occurring within the last 10 years: No If all of the above answers are "NO", then may proceed with Cephalosporin use.    Current Meds  Medication Sig   acetaminophen (TYLENOL) 500 MG tablet Take 500 mg by mouth every 6 (six) hours as needed for mild pain.    atorvastatin (LIPITOR) 40 MG tablet Take 1 tablet by mouth once daily   Blood Glucose Monitoring Suppl (ONETOUCH VERIO) w/Device KIT 1  each by Does not apply route as directed.   dapagliflozin propanediol (FARXIGA) 10 MG TABS tablet Take 1 tablet (10 mg total) by mouth daily before breakfast.   diclofenac Sodium (VOLTAREN) 1 % GEL Apply 4 g topically 4 (four) times daily.   furosemide (LASIX) 20 MG tablet Take 1 tablet (20 mg total) by mouth daily as needed for fluid or edema.   glucose blood (ONETOUCH VERIO) test strip USE 1 STRIP TO CHECK GLUCOSE ONCE DAILY AS DIRECTED   levothyroxine (EUTHYROX) 25 MCG tablet Take 1 tablet (25 mcg total) by mouth daily before breakfast.   OneTouch Delica Lancets 67J MISC USE 1  TO CHECK GLUCOSE ONCE DAILY   pantoprazole (PROTONIX) 40 MG tablet Take 1 tablet (40 mg total) by mouth daily.   spironolactone (ALDACTONE) 25 MG tablet Take 0.5 tablets (12.5 mg total) by mouth daily.   vitamin B-12 (CYANOCOBALAMIN) 1000 MCG tablet Take 1 tablet (1,000 mcg total) by mouth daily.   vitamin C (ASCORBIC ACID) 500 MG tablet Take 500 mg by mouth daily.    warfarin (COUMADIN) 5 MG tablet TAKE 1/2 TO 1 (ONE-HALF TO ONE) TABLET BY MOUTH ONCE DAILY AS  DIRECTED  BY  COUMADIN  CLINIC   [DISCONTINUED] sacubitril-valsartan (ENTRESTO) 24-26 MG Take 1 tablet by mouth 2 (two) times daily.    Review of Systems  Constitutional:  Negative for chills, fatigue and fever.  Respiratory:  Negative for cough, chest tightness, shortness of breath and wheezing.   Cardiovascular:  Negative for  chest pain, palpitations and leg swelling.   Objective:  BP 128/64 (BP Location: Left Arm, Patient Position: Sitting, Cuff Size: Large)   Pulse 65   Temp (!) 97.4 F (36.3 C) (Oral)   Ht '5\' 7"'  (1.702 m)   Wt 187 lb 4.8 oz (85 kg)   SpO2 99%   BMI 29.34 kg/m   Weight: 187 lb 4.8 oz (85 kg)   BP Readings from Last 3 Encounters:  05/21/21 116/63  05/21/21 128/64  04/21/21 130/78   Wt Readings from Last 3 Encounters:  05/21/21 188 lb 9.6 oz (85.5 kg)  05/21/21 187 lb 4.8 oz (85 kg)  04/21/21 185 lb 8 oz (84.1 kg)     Physical Exam Constitutional:      General: She is not in acute distress.    Appearance: She is well-developed.  Cardiovascular:     Rate and Rhythm: Normal rate and regular rhythm.     Heart sounds: Normal heart sounds. No murmur heard.   No friction rub.  Pulmonary:     Effort: Pulmonary effort is normal. No respiratory distress.     Breath sounds: Normal breath sounds. No wheezing or rales.  Musculoskeletal:     Right lower leg: No edema.     Left lower leg: No edema.  Neurological:     Mental Status: She is alert and oriented to person, place, and time.  Psychiatric:        Behavior: Behavior normal.    Assessment/Plan 1. Primary hypertension Blood pressures well controlled today.  Encouraged her to continue to check at home and make sure not too low or high.  Continue with current medications.  2. Coronary artery disease involving native coronary artery of native heart without angina pectoris Continue with good blood pressure control as well as cholesterol control.  Lipitor 40 mg.  3. PAF (paroxysmal atrial fibrillation) ([place]) She is managed by cardiology.  Currently on Coumadin.  4. Dilated cardiomyopathy ([place]) Managed by cardiology.  Stable.  Continues on Entresto, Toprol, spironolactone, and Lasix.  5. Onychomycosis She needs help with care of toenails.  Previously been referred to podiatry, her feet were examined, but they would not trim her nails.  I am referring her again today and she will ask for this service.  I do think it is important for her with her diabetes and inability to trim her own nails to have good nail care. - Ambulatory referral to Podiatry  6. Type 2 diabetes mellitus with diabetic polyneuropathy, without long-term current use of insulin (HCC) Blood sugars been very well controlled.  Continue current medications (Farxiga 10 mg) - Ambulatory referral to Podiatry     Looking into patient assistance. Sent chart back to [place] who had  previously mailed assistance paperwork.    Micheline Rough, MD

## 2021-05-21 NOTE — Patient Instructions (Signed)
Medication Instructions:  ? ?*If you need a refill on your cardiac medications before your next appointment, please call your pharmacy* ? ? ?Lab Work: ? ?If you have labs (blood work) drawn today and your tests are completely normal, you will receive your results only by: ?MyChart Message (if you have MyChart) OR ?A paper copy in the mail ?If you have any lab test that is abnormal or we need to change your treatment, we will call you to review the results. ? ? ?Testing/Procedures: ? ? ? ?Follow-Up: ?At [facility], you and your health needs are our priority.  As part of our continuing mission to provide you with exceptional heart care, we have created designated Provider Care Teams.  These Care Teams include your primary Cardiologist (physician) and Advanced Practice Providers (APPs -  Physician Assistants and Nurse Practitioners) who all work together to provide you with the care you need, when you need it. ? ?We recommend signing up for the patient portal called "MyChart".  Sign up information is provided on this After Visit Summary.  MyChart is used to connect with patients for Virtual Visits (Telemedicine).  Patients are able to view lab/test results, encounter notes, upcoming appointments, etc.  Non-urgent messages can be sent to your provider as well.   ?To learn more about what you can do with MyChart, go to [email or web address].   ? ?Your next appointment:   ?6 month(s) ? ?The format for your next appointment:   ?In Person ? ?Provider:   ?Paula Ross, MD   ? ? ?Other Instructions ? ? ?Important Information About Sugar ? ? ? ? ?  ?

## 2021-05-21 NOTE — Progress Notes (Unsigned)
Cardiology Office Note:    Date:  05/22/2021   ID:  Hannah [name], DOB 1940-12-05, MRN [id number]  PCP:  [name], MD  [facility] Providers Cardiologist:  Dorris Carnes, MD Cardiology APP:  Sh[facility]on [facility]     Referring MD: [name], MD   Patient presents for follow up   Patient Profile: Prior massive pulmonary embolism  Lifelong anticoagulation (Warfarin) (HFrEF) heart failure with reduced ejection fraction  Non-ischemic cardiomyopathy  Previously managed for Diastolic CHF Echocardiogram 10/2018: EF 55-60, Gr 1 DD Echocardiogram 4/22: EF 45-50 Echocardiogram 12/22: EF 30-35, Gr 1 DD, mild MR, mod TR Coronary calcification on CT Myoview 12/2018: Low risk Cath 12/22: no sig CAD (prox to mid RCA 20) Paroxysmal atrial fibrillation  Noted during admx for COVID-19, new onset CHF, chest pain in 12/22 Diabetes mellitus  Hypertension  Hyperlipidemia  Hypothyroidism  Chronic kidney disease  Hx of large bowel stricture, s/p colectomy w colostomy  admx in 12/2017 w L breast hematoma and cellulitis; Hgb dropped from 11.5>>8.8 (INR = 3.48); Warfarin held for 2 weeks admx for Syncope in 10/2018; neg workup (CT, MRI, Echocardiogram); felt to be due to orthostatic hypotension; poor hydration  Hx of LGI bleed (?diverticular) in 04/2020   Prior CV Studies: Cardiac catheterization 01-21-21 RCA prox to mid 20   Cardiac catheterization 12/30/20 Normal R heart pressures Unsuccessful L heart cath > returned January 21, 2021   Echocardiogram 12/26/20 EF 30-35, ant septum/apex, mid inf-sept AK; ant-lat/ant/inf HK, Gr 1 DD, mild LVH, normal RVSF, RVSP 27.2, small effusion (ant to RV), mild MR, mod TR, AV sclerosis w/o AS   Echocardiogram 04/23/20 Inf HK, EF 45-50, mild LVH, Gr 1 DD   Myoview 12/16/2018 No ischemia or infarction, EF 57; Low Risk    Echocardiogram 10/31/2018 EF 55-60, Gr 1 DD, normal RVSF, mild MAC, mild MR, mild TR    History of Present Illness:    Hannah [name] is a 81 y.o. female with the above problem list.  She was admitted in 12/22 with elevated hs-Trop levels and newly depressed EF (30-35) in the setting of COVID-19 c/b a single episode of AF w RVR.  She had no significant CAD on cath.  She went to the ED in 1/23 with chest pain, shortness of breath and low BP from taking NTG.   Workup was unremarkable.   The pt was last seen in cardiology in Feb 2023 b [facility] [place]     Since seen she has done OK     Breathing is stable   She denies CP    Past Medical History:  Diagnosis Date   Acute massive pulmonary embolism ([place]) 07/13/2012   Massive PE w/ PEA arrest 07/13/12 >TNK >IVC filter >discharged on comadin     Anticoagulated on warfarin    Chronic diastolic CHF (congestive heart failure) ([place]) 03/15/2017   Chronic kidney disease, stage 3a ([place]) 12/15/2018   Diabetes mellitus, type 2 ([place]) 07/15/2012   with peripheral neuropathy   DM2 (diabetes mellitus, type 2) ([place]) 07/23/2012   HFrEF (heart failure with reduced ejection fraction) ([place]) 03/15/2017   Non-ischemic cardiomyopathy // Cardiac catheterization 01-21-21:  RCA prox to mid 20 // Echocardiogram 12/26/20:  EF 30-35, ant septum/apex, mid inf-sept AK; ant-lat/ant/inf HK, Gr 1 DD, mild LVH, normal RVSF, RVSP 27.2, small effusion (ant to RV), mild MR, mod TR, AV sclerosis w/o AS   Hyperlipemia 07/14/2012   Hyperlipidemia 02/25/2013   Hypertension 07/14/2012   Hypothyroid 06/06/2013   Large  bowel stricture (HCC)    s/p colectomy in 2016   Neuropathy 02/12/2015   Osteoarthritis    s/p hip and knee replacements   PAF (paroxysmal atrial fibrillation) ([place])    Pulmonary hypertension ([place]) 07/26/2016   Syncope 10/2018   Thyroid disease    Current Medications: Current Meds  Medication Sig   acetaminophen (TYLENOL) 500 MG tablet Take 500 mg by mouth every 6 (six) hours as needed for mild pain.    atorvastatin (LIPITOR) 40 MG tablet Take 1 tablet by mouth once daily   Blood Glucose Monitoring Suppl  (ONETOUCH VERIO) w/Device KIT 1 each by Does not apply route as directed.   dapagliflozin propanediol (FARXIGA) 10 MG TABS tablet Take 1 tablet (10 mg total) by mouth daily before breakfast.   diclofenac Sodium (VOLTAREN) 1 % GEL Apply 4 g topically 4 (four) times daily.   furosemide (LASIX) 20 MG tablet Take 1 tablet (20 mg total) by mouth daily as needed for fluid or edema.   glucose blood (ONETOUCH VERIO) test strip USE 1 STRIP TO CHECK GLUCOSE ONCE DAILY AS DIRECTED   levothyroxine (EUTHYROX) 25 MCG tablet Take 1 tablet (25 mcg total) by mouth daily before breakfast.   metoprolol succinate (TOPROL-XL) 50 MG 24 hr tablet Take 1 tablet (50 mg total) by mouth every evening.   OneTouch Delica Lancets 82S MISC USE 1  TO CHECK GLUCOSE ONCE DAILY   pantoprazole (PROTONIX) 40 MG tablet Take 1 tablet (40 mg total) by mouth daily.   sacubitril-valsartan (ENTRESTO) 24-26 MG Take 1 tablet by mouth 2 (two) times daily.   spironolactone (ALDACTONE) 25 MG tablet Take 0.5 tablets (12.5 mg total) by mouth daily.   vitamin B-12 (CYANOCOBALAMIN) 1000 MCG tablet Take 1 tablet (1,000 mcg total) by mouth daily.   vitamin C (ASCORBIC ACID) 500 MG tablet Take 500 mg by mouth daily.    warfarin (COUMADIN) 5 MG tablet TAKE 1/2 TO 1 (ONE-HALF TO ONE) TABLET BY MOUTH ONCE DAILY AS  DIRECTED  BY  COUMADIN  CLINIC    Allergies:   Augmentin [amoxicillin-pot clavulanate], Tranxene [clorazepate], Crestor [rosuvastatin], and Penicillins   Social History   Tobacco Use   Smoking status: Former    Packs/day: 1.00    Years: 10.00    Pack years: 10.00    Types: Cigarettes    Quit date: 01/04/1968    Years since quitting: 53.4   Smokeless tobacco: Never  Vaping Use   Vaping Use: Never used  Substance Use Topics   Alcohol use: No   Drug use: No    Family Hx: The patient's family history includes Breast cancer in her mother.  ROS see HPI  EKGs/Labs/Other Test Reviewed:    EKG:  EKG is   not ordered today      Recent Labs: 08/19/2020: TSH 2.93 12/28/2020: Magnesium 2.0 01/08/2021: B Natriuretic Peptide 214.3 02/15/2021: ALT 8; Hemoglobin 13.3; Platelets 241.0 04/16/2021: BUN 30; Creatinine, Ser 1.29; Potassium 4.4; Sodium 138   Recent Lipid Panel Recent Labs    02/15/21 1301  CHOL 150  TRIG 80.0  HDL 67.50  VLDL 16.0  LDLCALC 66     Risk Assessment/Calculations:    CHA2DS2-VASc Score = 9   This indicates a 12.2% annual risk of stroke. The patient's score is based upon: CHF History: 1 HTN History: 1 Diabetes History: 1 Stroke History: 2 Vascular Disease History: 1 Age Score: 2 Gender Score: 1   Physical Exam:    VS:  BP 116/63  Pulse 67   Ht _0  (1.702 m)   Wt 188 lb 9.6 oz (85.5 kg)   SpO2 98%   BMI 29.54 kg/m     Wt Readings from Last 3 Encounters:  05/21/21 188 lb 9.6 oz (85.5 kg)  05/21/21 187 lb 4.8 oz (85 kg)  04/21/21 185 lb 8 oz (84.1 kg)    Pt in NAD Neck   JVP is normal     Lungs are CTA  Cardiac RRR   No S3   No signficant murmurs  Abd is supple    Ext with trivial edema         ASSESSMENT AND PLAN  HFrEF (heart failure with reduced ejection fraction) (HCC) Non-ischemic cardiomyopathy.  EF 30-35.  NYHA II-IIb.  Volume status stable.   Continue on Entresto 24/26 bid, dapagliflozin 10, Toprol XL 50, aldactone. Will get follow up echo this summer    PAF (paroxysmal atrial fibrillation) ([place]) Clinically in SR Keep on lifelong coumadin b/c of prior hx of massive pulmonary embolism.     CAD (coronary artery disease) Minimal nonobstructive CAD at cath     Hypertension BP is controlled      Hyperlipidemia Continue atorvastatin 40 mg daily.   LDL is 66          Medication Adjustments/Labs and Tests Ordered: Current medicines are reviewed at length with the patient today.  Concerns regarding medicines are outlined above.  Tests Ordered: No orders of the defined types were placed in this encounter.  Medication Changes: No orders of the  defined types were placed in this encounter.  Signed, Dorris Carnes, MD  05/22/2021 9:54 PM    [facility] [address], [place], [place]  [address] Phone: [phone]; Fax: [phone]

## 2021-05-24 ENCOUNTER — Telehealth: Payer: Self-pay | Admitting: Internal Medicine

## 2021-05-24 ENCOUNTER — Ambulatory Visit (HOSPITAL_COMMUNITY): Payer: Medicare Other | Attending: Cardiovascular Disease

## 2021-05-24 DIAGNOSIS — I255 Ischemic cardiomyopathy: Secondary | ICD-10-CM | POA: Insufficient documentation

## 2021-05-24 LAB — ECHOCARDIOGRAM LIMITED
Area-P 1/2: 4.65 cm2
S' Lateral: 2.4 cm

## 2021-05-24 NOTE — Telephone Encounter (Signed)
Called pt in regards to cost of Entresto. Pt reports medication did cost $40.  Pt went to pick up refill and was told medication would be $247 out of pocket.  Pt last took medication on 05/22/21 evening dose.  Pt does not have any more Entresto left or information for pt assistance.  Advised pt to have daughter go to [employer] patient assistance to start the process for pt assistance.  Also, advised that will send information to pt assistance nurse to follow up with pt. Will also route to MD to advise.

## 2021-05-24 NOTE — Telephone Encounter (Signed)
Pt c/o medication issue:  1. Name of Medication: sacubitril-valsartan (ENTRESTO) 24-26 MG  2. How are you currently taking this medication (dosage and times per day)? As prescribed   3. Are you having a reaction (difficulty breathing--STAT)? No  4. What is your medication issue?  Pt is requesting call back to discuss the price of this medication. She states that the amount has gone up significantly and would like a call back to discuss her options.

## 2021-05-24 NOTE — Telephone Encounter (Signed)
Agree   See if patient assist  can help   Otherwise will switch to something else

## 2021-05-25 NOTE — Telephone Encounter (Deleted)
**Note De-Identified  Obfuscation** The pt reports that she has been out of Entresto for 2 days now and cannot afford her refill.  We discussed pt asst for Entresto through [employer] pt asst foundation and she is interested as I made her aware that if they approve her she will receive her [name] free of charge from them for the remainder of this year. I gave her [employer] pt asst foundation's phone number and advised her to call them with questions about their [facility] program and he eligibility to be approved.  She is aware that we are leaving her a [employer] pt asst application and 2 weeks of Entresto samples in the front office at [facility] office at [employer] at [address] in [place] to pick up.  She is also aware (if [employer] tells her that she is eligible for approval) to complete her ar of the application, obtain required documents

## 2021-05-25 NOTE — Telephone Encounter (Signed)
I have left them upfront as requested. Thanks

## 2021-05-25 NOTE — Telephone Encounter (Signed)
**Note De-Identified  Obfuscation** The pt reports that she has been out of Entresto for 2 days now and cannot afford her refill.   We discussed pt asst for Entresto through [employer] pt asst foundation and she is interested as I made her aware that if they approve her she will receive her [name] free of charge from them for the remainder of this year. I gave her [employer] pt asst foundation's phone number and advised her to call them with questions about their [facility] program and he eligibility to be approved.   She is aware that we are leaving her a [employer] pt asst application and 2 weeks of Entresto samples in the front office at [facility] office at [employer] at [address] in [place] to pick up.   She is also aware (if [employer] tells her that she is eligible for approval) to complete her part of the application, obtain required documents per [employer], and to drop all of in the front office and that we will take care of the providers page of her application and will fax all to [employer].  She verbalized understanding and thanked me for our assistance.

## 2021-05-26 NOTE — Progress Notes (Signed)
Pt has been made aware of normal result and verbalized understanding.  jw

## 2021-05-28 ENCOUNTER — Telehealth: Payer: Self-pay | Admitting: Pharmacist

## 2021-05-28 NOTE — Chronic Care Management (AMB) (Signed)
    Chronic Care Management Pharmacy Assistant   Name: Hannah [name]  MRN: [id number] DOB: Feb 13, 1940  Reason for Encounter: [employer] patient assistance application.  Form to be mailed next week to the patient.  Patient notified to watch for this application to come in a plain white envelope.  [name] [facility]  Clinical Pharmacist Assistant [phone]

## 2021-06-01 ENCOUNTER — Ambulatory Visit: Payer: Medicare Other | Admitting: *Deleted

## 2021-06-01 DIAGNOSIS — I2699 Other pulmonary embolism without acute cor pulmonale: Secondary | ICD-10-CM

## 2021-06-01 DIAGNOSIS — Z5181 Encounter for therapeutic drug level monitoring: Secondary | ICD-10-CM

## 2021-06-01 LAB — POCT INR: INR: 2.5 (ref 2.0–3.0)

## 2021-06-01 NOTE — Patient Instructions (Signed)
Description   Continue taking Warfarin 1/2 tablet daily except for 1 tablet on Mondays and Fridays. Keep a green leafy veggie in your diet 2-3 times a week. Recheck INR in 5 weeks.  [facility] [phone].

## 2021-06-09 DIAGNOSIS — Z4801 Encounter for change or removal of surgical wound dressing: Secondary | ICD-10-CM | POA: Diagnosis not present

## 2021-06-09 DIAGNOSIS — Z933 Colostomy status: Secondary | ICD-10-CM | POA: Diagnosis not present

## 2021-06-09 DIAGNOSIS — E119 Type 2 diabetes mellitus without complications: Secondary | ICD-10-CM | POA: Diagnosis not present

## 2021-06-09 DIAGNOSIS — K56609 Unspecified intestinal obstruction, unspecified as to partial versus complete obstruction: Secondary | ICD-10-CM | POA: Diagnosis not present

## 2021-06-10 ENCOUNTER — Ambulatory Visit: Payer: Medicare Other | Admitting: Podiatry

## 2021-06-10 DIAGNOSIS — M79675 Pain in left toe(s): Secondary | ICD-10-CM

## 2021-06-10 DIAGNOSIS — I739 Peripheral vascular disease, unspecified: Secondary | ICD-10-CM | POA: Diagnosis not present

## 2021-06-10 DIAGNOSIS — E1149 Type 2 diabetes mellitus with other diabetic neurological complication: Secondary | ICD-10-CM

## 2021-06-10 DIAGNOSIS — M79674 Pain in right toe(s): Secondary | ICD-10-CM | POA: Diagnosis not present

## 2021-06-10 DIAGNOSIS — B351 Tinea unguium: Secondary | ICD-10-CM

## 2021-06-13 NOTE — Progress Notes (Signed)
Subjective:   Patient ID: Hannah [name], female   DOB: 81 y.o.   MRN: [id number]   HPI 81 year old female presents the office today for concerns of thick, elongated toes that she is not able to trim herself.  She says is been quite sometime since she had her nails trimmed and the nails are very long and she is not able to care for them herself.  Her last A1c she reports was 6.7 and her last glucose was 124.  She last saw her primary care doctor on May 21, 2021.   Review of Systems  All other systems reviewed and are negative.  Past Medical History:  Diagnosis Date   Acute massive pulmonary embolism ([place]) 07/13/2012   Massive PE w/ PEA arrest 07/13/12 >TNK >IVC filter >discharged on comadin     Anticoagulated on warfarin    Chronic diastolic CHF (congestive heart failure) ([name]) 03/15/2017   Chronic kidney disease, stage 3a ([place]) 12/15/2018   Diabetes mellitus, type 2 ([place]) 07/15/2012   with peripheral neuropathy   DM2 (diabetes mellitus, type 2) ([place]) 07/23/2012   HFrEF (heart failure with reduced ejection fraction) ([place]) 03/15/2017   Non-ischemic cardiomyopathy // Cardiac catheterization 01/01/21:  RCA prox to mid 20 // Echocardiogram 12/26/20:  EF 30-35, ant septum/apex, mid inf-sept AK; ant-lat/ant/inf HK, Gr 1 DD, mild LVH, normal RVSF, RVSP 27.2, small effusion (ant to RV), mild MR, mod TR, AV sclerosis w/o AS   Hyperlipemia 07/14/2012   Hyperlipidemia 02/25/2013   Hypertension 07/14/2012   Hypothyroid 06/06/2013   Large bowel stricture ([place])    s/p colectomy in 2016   Neuropathy 02/12/2015   Osteoarthritis    s/p hip and knee replacements   PAF (paroxysmal atrial fibrillation) ([place])    Pulmonary hypertension ([place]) 07/26/2016   Syncope 10/2018   Thyroid disease     Past Surgical History:  Procedure Laterality Date   COLONOSCOPY N/A 03/12/2016   Procedure: COLONOSCOPY;  Surgeon: Teena Irani, MD;  Location: [place];  Service: Endoscopy;  Laterality: N/A;   COLOSTOMY N/A 06/03/2014    Procedure: COLOSTOMY;  Surgeon: Erroll Luna, MD;  Location: [place];  Service: General;  Laterality: N/A;   FLEXIBLE SIGMOIDOSCOPY N/A 05/30/2014   Procedure: Beryle Quant;  Surgeon: Carol Ada, MD;  [facility] ENDOSCOPY;  Service: Endoscopy;  Laterality: N/A;   INSERTION OF VENA CAVA FILTER N/A 07/16/2012   Procedure: INSERTION OF VENA CAVA FILTER;  Surgeon: Elam Dutch, MD;  Location: [facility] CATH LAB;  Service: Cardiovascular;  Laterality: N/A;   KNEE ARTHROSCOPY     LEFT HEART CATH AND CORONARY ANGIOGRAPHY N/A 01/01/2021   Procedure: LEFT HEART CATH AND CORONARY ANGIOGRAPHY;  Surgeon: Burnell Blanks, MD;  Location: [place] CV LAB;  Service: Cardiovascular;  Laterality: N/A;   PARTIAL COLECTOMY N/A 06/03/2014   Procedure: PARTIAL COLECTOMY;  Surgeon: Erroll Luna, MD;  Location: [place];  Service: General;  Laterality: N/A;   REDUCTION MAMMAPLASTY Bilateral    RIGHT HEART CATH N/A 12/30/2020   Procedure: RIGHT HEART CATH;  Surgeon: Burnell Blanks, MD;  Location: [place] CV LAB;  Service: Cardiovascular;  Laterality: N/A;   SP ARTHRO HIP*L*       Current Outpatient Medications:    acetaminophen (TYLENOL) 500 MG tablet, Take 500 mg by mouth every 6 (six) hours as needed for mild pain. , Disp: , Rfl:    atorvastatin (LIPITOR) 40 MG tablet, Take 1 tablet by mouth once daily, Disp: 90 tablet, Rfl: 0  Blood Glucose Monitoring Suppl (ONETOUCH VERIO) w/Device KIT, 1 each by Does not apply route as directed., Disp: 1 kit, Rfl: 0   dapagliflozin propanediol (FARXIGA) 10 MG TABS tablet, Take 1 tablet (10 mg total) by mouth daily before breakfast., Disp: 90 tablet, Rfl: 3   diclofenac Sodium (VOLTAREN) 1 % GEL, Apply 4 g topically 4 (four) times daily., Disp: , Rfl:    furosemide (LASIX) 20 MG tablet, Take 1 tablet (20 mg total) by mouth daily as needed for fluid or edema., Disp: 90 tablet, Rfl: 1   glucose blood (ONETOUCH VERIO) test strip, USE 1 STRIP TO CHECK  GLUCOSE ONCE DAILY AS DIRECTED, Disp: 100 each, Rfl: 3   levothyroxine (EUTHYROX) 25 MCG tablet, Take 1 tablet (25 mcg total) by mouth daily before breakfast., Disp: 90 tablet, Rfl: 1   metoprolol succinate (TOPROL-XL) 50 MG 24 hr tablet, Take 1 tablet (50 mg total) by mouth every evening., Disp: 30 tablet, Rfl: 11   OneTouch Delica Lancets 41D MISC, USE 1  TO CHECK GLUCOSE ONCE DAILY, Disp: 100 each, Rfl: 3   pantoprazole (PROTONIX) 40 MG tablet, Take 1 tablet (40 mg total) by mouth daily., Disp: 90 tablet, Rfl: 3   sacubitril-valsartan (ENTRESTO) 24-26 MG, Take 1 tablet by mouth 2 (two) times daily., Disp: 180 tablet, Rfl: 1   spironolactone (ALDACTONE) 25 MG tablet, Take 0.5 tablets (12.5 mg total) by mouth daily., Disp: 45 tablet, Rfl: 3   vitamin B-12 (CYANOCOBALAMIN) 1000 MCG tablet, Take 1 tablet (1,000 mcg total) by mouth daily., Disp: , Rfl:    vitamin C (ASCORBIC ACID) 500 MG tablet, Take 500 mg by mouth daily. , Disp: , Rfl:    warfarin (COUMADIN) 5 MG tablet, TAKE 1/2 TO 1 (ONE-HALF TO ONE) TABLET BY MOUTH ONCE DAILY AS  DIRECTED  BY  COUMADIN  CLINIC, Disp: 80 tablet, Rfl: 1  Allergies  Allergen Reactions   Augmentin [Amoxicillin-Pot Clavulanate] Itching and Other (See Comments)    Severe vaginal itching   Tranxene [Clorazepate] Itching   Crestor [Rosuvastatin]     Made her sick on stomach, she is able to tolerate the zocor   Penicillins Itching and Rash    Has patient had a PCN reaction causing immediate rash, facial/tongue/throat swelling, SOB or lightheadedness with hypotension: Yes Has patient had a PCN reaction causing severe rash involving mucus membranes or skin necrosis: No Has patient had a PCN reaction that required hospitalization: No Has patient had a PCN reaction occurring within the last 10 years: No If all of the above answers are "NO", then may proceed with Cephalosporin use.            Objective:  Physical Exam  General: AAO x3, NAD  Dermatological:  Nails are hypertrophic, dystrophic, brittle, yellow-brown discoloration, very elongated 10. No surrounding redness or drainage. Tenderness nails 1-5 bilaterally. No open lesions or pre-ulcerative lesions are identified today.  Vascular: Dorsalis Pedis artery and Posterior Tibial artery pedal pulses are decreased bilateral with immedate capillary fill time. There is no pain with calf compression, swelling, warmth, erythema.   Neruologic: Sensation decreased with Semmes Weinstein monofilament.  Musculoskeletal: No other areas of discomfort.. Muscular strength 5/5 in all groups tested bilateral.     Assessment:   81 year old female with symptomatic onychomycosis     Plan:  -Treatment options discussed including all alternatives, risks, and complications -Etiology of symptoms were discussed -Nails debrided 10 without complications or bleeding. -Daily foot inspection -Follow-up in 3 months or sooner if  any problems arise. In the meantime, encouraged to call the office with any questions, concerns, change in symptoms.   Celesta Gentile, DPM

## 2021-06-16 ENCOUNTER — Emergency Department (HOSPITAL_COMMUNITY): Payer: Medicare Other

## 2021-06-16 ENCOUNTER — Other Ambulatory Visit: Payer: Self-pay

## 2021-06-16 ENCOUNTER — Emergency Department (HOSPITAL_COMMUNITY)
Admission: EM | Admit: 2021-06-16 | Discharge: 2021-06-16 | Disposition: A | Discharge status: Home / Self Care | Payer: Medicare Other | Attending: Emergency Medicine | Admitting: Emergency Medicine

## 2021-06-16 ENCOUNTER — Encounter (HOSPITAL_COMMUNITY): Payer: Self-pay

## 2021-06-16 DIAGNOSIS — Z79899 Other long term (current) drug therapy: Secondary | ICD-10-CM | POA: Diagnosis not present

## 2021-06-16 DIAGNOSIS — I700 Atherosclerosis of aorta: Secondary | ICD-10-CM | POA: Diagnosis not present

## 2021-06-16 DIAGNOSIS — I10 Essential (primary) hypertension: Secondary | ICD-10-CM | POA: Insufficient documentation

## 2021-06-16 DIAGNOSIS — R030 Elevated blood-pressure reading, without diagnosis of hypertension: Secondary | ICD-10-CM | POA: Diagnosis not present

## 2021-06-16 LAB — CBC WITH DIFFERENTIAL/PLATELET
Abs Immature Granulocytes: 0.02 10*3/uL (ref 0.00–0.07)
Basophils Absolute: 0.1 10*3/uL (ref 0.0–0.1)
Basophils Relative: 1 %
Eosinophils Absolute: 0.2 10*3/uL (ref 0.0–0.5)
Eosinophils Relative: 3 %
HCT: 41.8 % (ref 36.0–46.0)
Hemoglobin: 12.4 g/dL (ref 12.0–15.0)
Immature Granulocytes: 0 %
Lymphocytes Relative: 33 %
Lymphs Abs: 3.1 10*3/uL (ref 0.7–4.0)
MCH: 24 pg — ABNORMAL LOW (ref 26.0–34.0)
MCHC: 29.7 g/dL — ABNORMAL LOW (ref 30.0–36.0)
MCV: 80.9 fL (ref 80.0–100.0)
Monocytes Absolute: 0.6 10*3/uL (ref 0.1–1.0)
Monocytes Relative: 6 %
Neutro Abs: 5.4 10*3/uL (ref 1.7–7.7)
Neutrophils Relative %: 57 %
Platelets: 305 10*3/uL (ref 150–400)
RBC: 5.17 MIL/uL — ABNORMAL HIGH (ref 3.87–5.11)
RDW: 19.6 % — ABNORMAL HIGH (ref 11.5–15.5)
WBC: 9.4 10*3/uL (ref 4.0–10.5)
nRBC: 0 % (ref 0.0–0.2)

## 2021-06-16 LAB — BASIC METABOLIC PANEL
Anion gap: 8 (ref 5–15)
BUN: 28 mg/dL — ABNORMAL HIGH (ref 8–23)
CO2: 19 mmol/L — ABNORMAL LOW (ref 22–32)
Calcium: 8.8 mg/dL — ABNORMAL LOW (ref 8.9–10.3)
Chloride: 113 mmol/L — ABNORMAL HIGH (ref 98–111)
Creatinine, Ser: 1.49 mg/dL — ABNORMAL HIGH (ref 0.44–1.00)
GFR, Estimated: 35 mL/min — ABNORMAL LOW (ref >60)
Glucose, Bld: 86 mg/dL (ref 70–99)
Potassium: 4.1 mmol/L (ref 3.5–5.1)
Sodium: 140 mmol/L (ref 135–145)

## 2021-06-16 LAB — TROPONIN I (HIGH SENSITIVITY)
Troponin I (High Sensitivity): 6 ng/L (ref <18)
Troponin I (High Sensitivity): 9 ng/L (ref <18)

## 2021-06-16 MED ORDER — HYDRALAZINE HCL 25 MG PO TABS
25.0000 mg | ORAL_TABLET | Freq: Once | ORAL | Status: AC
  Administered 2021-06-16: 25 mg via ORAL
  Filled 2021-06-16: qty 1

## 2021-06-16 NOTE — ED Triage Notes (Signed)
Pt arrived POV from home stating she has an aid come check on her on M,W,F. They told her to come her because her blood pressure keeps going up. Pt states she had some chest pain on Monday but it went away.

## 2021-06-16 NOTE — Discharge Instructions (Addendum)
If your blood pressure continues to be high at home, call your primary care doctor to make an appointment.  If you develop chest pain or difficulty breathing or any other concerns, return to the ER.

## 2021-06-16 NOTE — ED Provider Triage Note (Signed)
Emergency Medicine Provider Triage Evaluation Note  Hannah [name] , a 81 y.o. female  was evaluated in triage.  Pt complains of elevated blood pressure.  Complains of chest pain today and Monday.  Currently without chest pain or other complaints.  Does have history of PE on lifelong anticoagulation with warfarin.  HFrEF.  Review of Systems  Positive: As above Negative: As above  Physical Exam  BP (!) 176/69 (BP Location: Right Arm)   Pulse (!) 52   Temp 97.7 F (36.5 C) (Oral)   Resp 18   Ht 5\' 7"  (1.702 m)   Wt 80.3 kg   SpO2 100%   BMI 27.72 kg/m  Gen:   Awake, no distress   Resp:  Normal effort  MSK:   Moves extremities without difficulty  Other:    Medical Decision Making  Medically screening exam initiated at 12:45 PM.  Appropriate orders placed.  Hannah [name] was informed that the remainder of the evaluation will be completed by another provider, this initial triage assessment does not replace that evaluation, and the importance of remaining in the ED until their evaluation is complete.     [name], PA-C 06/16/21 1246

## 2021-06-16 NOTE — ED Provider Notes (Signed)
[facility] EMERGENCY DEPARTMENT Provider Note   CSN: [id number] Arrival date & time: 06/16/21  1217     History  Chief Complaint  Patient presents with   Hypertension    Hannah [name] is a 81 y.o. female.  Patient presents ER for elevated blood pressure.  She states she has a home health aide that comes 3 times a week to check on her today, noted that she had hypertension and advised her to come to the ER.  Patient otherwise has no other symptoms no headache no chest pain no abdominal pain no fever no cough no vomiting or diarrhea.  She states she has been compliant with her blood pressure medications.  She had some chest discomfort 3 days ago but denies any recent chest pains.  No other symptoms at this time.       Home Medications Prior to Admission medications   Medication Sig Start Date End Date Taking? Authorizing Provider  acetaminophen (TYLENOL) 500 MG tablet Take 500 mg by mouth every 6 (six) hours as needed for mild pain.     [provider]  atorvastatin (LIPITOR) 40 MG tablet Take 1 tablet by mouth once daily 05/18/21   Koberlein, Andris Flurry C, MD  Blood Glucose Monitoring Suppl (ONETOUCH VERIO) w/Device KIT 1 each by Does not apply route as directed. 06/07/18   [name], MD  dapagliflozin propanediol (FARXIGA) 10 MG TABS tablet Take 1 tablet (10 mg total) by mouth daily before breakfast. 03/24/21   Richardson Dopp T, PA-C  diclofenac Sodium (VOLTAREN) 1 % GEL Apply 4 g topically 4 (four) times daily. 06/26/19   Lamptey, Myrene Galas, MD  furosemide (LASIX) 20 MG tablet Take 1 tablet (20 mg total) by mouth daily as needed for fluid or edema. 03/10/21   Koberlein, Andris Flurry C, MD  glucose blood (ONETOUCH VERIO) test strip USE 1 STRIP TO CHECK GLUCOSE ONCE DAILY AS DIRECTED 08/14/20   [name], MD  levothyroxine (EUTHYROX) 25 MCG tablet Take 1 tablet (25 mcg total) by mouth daily before breakfast. 08/25/20   Koberlein, Steele Berg, MD  metoprolol  succinate (TOPROL-XL) 50 MG 24 hr tablet Take 1 tablet (50 mg total) by mouth every evening. 02/17/21 05/21/21  Richardson Dopp T, PA-C  OneTouch Delica Lancets 18H MISC USE 1  TO CHECK GLUCOSE ONCE DAILY 07/02/18   Koberlein, Steele Berg, MD  pantoprazole (PROTONIX) 40 MG tablet Take 1 tablet (40 mg total) by mouth daily. 02/26/21   Koberlein, Steele Berg, MD  sacubitril-valsartan (ENTRESTO) 24-26 MG Take 1 tablet by mouth 2 (two) times daily. 05/21/21   [name], MD  spironolactone (ALDACTONE) 25 MG tablet Take 0.5 tablets (12.5 mg total) by mouth daily. 03/23/21 03/18/22  Richardson Dopp T, PA-C  vitamin B-12 (CYANOCOBALAMIN) 1000 MCG tablet Take 1 tablet (1,000 mcg total) by mouth daily. 05/15/20   [name], MD  vitamin C (ASCORBIC ACID) 500 MG tablet Take 500 mg by mouth daily.     [provider]  warfarin (COUMADIN) 5 MG tablet TAKE 1/2 TO 1 (ONE-HALF TO ONE) TABLET BY MOUTH ONCE DAILY AS  DIRECTED  BY  COUMADIN  CLINIC 03/17/21   Fay Records, MD      Allergies    Augmentin [amoxicillin-pot clavulanate], Tranxene [clorazepate], Crestor [rosuvastatin], and Penicillins    Review of Systems   Review of Systems  Constitutional:  Negative for fever.  HENT:  Negative for ear pain.   Eyes:  Negative for  pain.  Respiratory:  Negative for cough.   Gastrointestinal:  Negative for abdominal pain.  Genitourinary:  Negative for flank pain.  Musculoskeletal:  Negative for back pain.  Skin:  Negative for rash.  Neurological:  Negative for headaches.    Physical Exam Updated Vital Signs BP (!) 174/80 (BP Location: Left Arm)   Pulse 64   Temp 97.8 F (36.6 C) (Oral)   Resp 18   Ht '5\' 7"'  (1.702 m)   Wt 80.3 kg   SpO2 100%   BMI 27.72 kg/m  Physical Exam Constitutional:      General: She is not in acute distress.    Appearance: Normal appearance.  HENT:     Head: Normocephalic.     Nose: Nose normal.  Eyes:     Extraocular Movements: Extraocular movements intact.   Cardiovascular:     Rate and Rhythm: Normal rate.  Pulmonary:     Effort: Pulmonary effort is normal.  Musculoskeletal:        General: Normal range of motion.     Cervical back: Normal range of motion.  Neurological:     General: No focal deficit present.     Mental Status: She is alert. Mental status is at baseline.     Cranial Nerves: No cranial nerve deficit.     Motor: No weakness.     Gait: Gait normal.     ED Results / Procedures / Treatments   Labs (all labs ordered are listed, but only abnormal results are displayed) Labs Reviewed  CBC WITH DIFFERENTIAL/PLATELET - Abnormal; Notable for the following components:      Result Value   RBC 5.17 (*)    MCH 24.0 (*)    MCHC 29.7 (*)    RDW 19.6 (*)    All other components within normal limits  BASIC METABOLIC PANEL - Abnormal; Notable for the following components:   Chloride 113 (*)    CO2 19 (*)    BUN 28 (*)    Creatinine, Ser 1.49 (*)    Calcium 8.8 (*)    GFR, Estimated 35 (*)    All other components within normal limits  TROPONIN I (HIGH SENSITIVITY)  TROPONIN I (HIGH SENSITIVITY)    EKG None  Radiology DG Chest 2 View  Result Date: 06/16/2021 CLINICAL DATA:  Elevated blood pressure. EXAM: CHEST - 2 VIEW COMPARISON:  Chest x-ray 01/08/2021 FINDINGS: The cardiac silhouette, mediastinal and hilar contours are within normal limits and stable. Mild calcification of thoracic aorta again noted. Moderate stable eventration of the right hemidiaphragm with some overlying vascular crowding. Stable streaky basilar scarring changes. No infiltrates, edema effusions. The bony thorax is intact. IMPRESSION: No acute cardiopulmonary findings. Electronically Signed   By: Marijo Sanes M.D.   On: 06/16/2021 13:02    Procedures Procedures    Medications Ordered in ED Medications  hydrALAZINE (APRESOLINE) tablet 25 mg (has no administration in time range)    ED Course/ Medical Decision Making/ A&P Clinical Course as of  06/16/21 1938  Wed Jun 16, 2021  1841 DG Chest 2 View [JH]    Clinical Course User Index [JH] Almyra Free, Greggory Brandy, MD                           Medical Decision Making Amount and/or Complexity of Data Reviewed Radiology:  Decision-making details documented in ED Course.  Risk Prescription drug management.   Chart review shows office visit June 10, 2021  for fingernail issue.  Cardiac monitoring showing sinus rhythm.  Sinus studies were sent which are unremarkable white count normal chemistry normal troponin is negative.  Patient denies any pain or discomfort at this time blood pressure moderately elevated here, given hydralazine.  Will be discharged home, advised her to follow-up with her primary care doctor in the office this week.  Advising immediate return for worsening symptoms difficulty breathing chest pain or any additional concerns.        Final Clinical Impression(s) / ED Diagnoses Final diagnoses:  Hypertension, unspecified type    Rx / DC Orders ED Discharge Orders     None         Luna Fuse, MD 06/16/21 [phone]

## 2021-06-17 ENCOUNTER — Telehealth: Payer: Self-pay | Admitting: Pharmacist

## 2021-06-17 NOTE — Chronic Care Management (AMB) (Signed)
  Chronic Care Management Pharmacy Assistant   Name: Hannah [name]  MRN: [id number] DOB: 02/02/1940  Reason for Encounter: Disease State / Hypertension Assessment Call   Conditions to be addressed/monitored: HTN  Recent office visits:  05/21/2021 Junell Koberlein MD - Patient was seen for Primary hypertension and additional issues. Referral to Podiatry. No medication changes. Follow up in 3 months.  04/21/2021 Junell Koberlein MD - Patient was seen for Type 2 diabetes mellitus with diabetic polyneuropathy, without long-term current use of insulin and additional issues. No medication changes. Follow up in 4 months.   Recent consult visits:  06/10/2021 Matthew Wagoner DPM(podiatry) - Patient was seen for Dermatophytosis of nail and additional issues. No medication changes. Follow up in 3 months.   05/21/2021 Paula Ross MD (cardiology) - Patient was seen for hypertension unspecified type. No medication changes. Follow up in 6 months.   Hospital visits:  Patient was seen at [facility] ED on 06/16/2021 (7 hours) due to hypertension.    New?Medications Started at Hospital Discharge:?? No medications started Medication Changes at Hospital Discharge: No medications changed Medications Discontinued at Hospital Discharge: No medications discontinued Medications that remain the same after Hospital Discharge:??  -All other medications will remain the same.    Medications: Outpatient Encounter Medications as of 06/17/2021  Medication Sig   acetaminophen (TYLENOL) 500 MG tablet Take 500 mg by mouth every 6 (six) hours as needed for mild pain.    atorvastatin (LIPITOR) 40 MG tablet Take 1 tablet by mouth once daily   Blood Glucose Monitoring Suppl (ONETOUCH VERIO) w/Device KIT 1 each by Does not apply route as directed.   dapagliflozin propanediol (FARXIGA) 10 MG TABS tablet Take 1 tablet (10 mg total) by mouth daily before breakfast.   diclofenac Sodium (VOLTAREN) 1 % GEL  Apply 4 g topically 4 (four) times daily.   furosemide (LASIX) 20 MG tablet Take 1 tablet (20 mg total) by mouth daily as needed for fluid or edema.   glucose blood (ONETOUCH VERIO) test strip USE 1 STRIP TO CHECK GLUCOSE ONCE DAILY AS DIRECTED   levothyroxine (EUTHYROX) 25 MCG tablet Take 1 tablet (25 mcg total) by mouth daily before breakfast.   metoprolol succinate (TOPROL-XL) 50 MG 24 hr tablet Take 1 tablet (50 mg total) by mouth every evening.   OneTouch Delica Lancets 30G MISC USE 1  TO CHECK GLUCOSE ONCE DAILY   pantoprazole (PROTONIX) 40 MG tablet Take 1 tablet (40 mg total) by mouth daily.   sacubitril-valsartan (ENTRESTO) 24-26 MG Take 1 tablet by mouth 2 (two) times daily.   spironolactone (ALDACTONE) 25 MG tablet Take 0.5 tablets (12.5 mg total) by mouth daily.   vitamin B-12 (CYANOCOBALAMIN) 1000 MCG tablet Take 1 tablet (1,000 mcg total) by mouth daily.   vitamin C (ASCORBIC ACID) 500 MG tablet Take 500 mg by mouth daily.    warfarin (COUMADIN) 5 MG tablet TAKE 1/2 TO 1 (ONE-HALF TO ONE) TABLET BY MOUTH ONCE DAILY AS  DIRECTED  BY  COUMADIN  CLINIC   No facility-administered encounter medications on file as of 06/17/2021.  Fill History: ATORVASTATIN 40MG   TAB 05/18/2021 90   FARXIGA 10MG TAB 05/29/2021 30   FUROSEMIDE 20MG     TAB 06/02/2021 90   EUTHYROX 25MCG      TAB 06/01/2021 90   METOPROLOL ER 50MG  TAB 05/30/2021 30   PANTOPRAZOLE 40MG TAB 05/30/2021 90   ENTRESTO 24-26MG    TAB 04/18/2021 30   SPIRONOLACTONE   25MG    TAB 03/23/2021 90   WARFARIN 5MG        TAB 06/02/2021 80   Reviewed chart prior to disease state call. Spoke with patient regarding BP  Recent Office Vitals: BP Readings from Last 3 Encounters:  06/16/21 (!) 179/74  05/21/21 116/63  05/21/21 128/64   Pulse Readings from Last 3 Encounters:  06/16/21 77  05/21/21 67  05/21/21 65    Wt Readings from Last 3 Encounters:  06/16/21 177 lb (80.3 kg)  05/21/21 188 lb 9.6 oz (85.5 kg)   05/21/21 187 lb 4.8 oz (85 kg)     Kidney Function Lab Results  Component Value Date/Time   CREATININE 1.49 (H) 06/16/2021 01:15 PM   CREATININE 1.29 (H) 04/16/2021 11:46 AM   CREATININE 1.32 (H) 12/03/2019 01:23 PM   CREATININE 1.31 (H) 10/30/2019 11:09 AM   GFR 37.65 (L) 02/15/2021 01:01 PM   GFRNONAA 35 (L) 06/16/2021 01:15 PM   GFRNONAA 39 (L) 12/03/2019 01:23 PM   GFRAA 45 (L) 12/03/2019 01:23 PM       Latest Ref Rng & Units 06/16/2021    1:15 PM 04/16/2021   11:46 AM 04/06/2021    3:33 PM  BMP  Glucose 70 - 99 mg/dL 86  106  82   BUN 8 - 23 mg/dL 28  30  23   Creatinine 0.44 - 1.00 mg/dL 1.49  1.29  1.56   BUN/Creat Ratio 12 - 28  23  15   Sodium 135 - 145 mmol/L 140  138  143   Potassium 3.5 - 5.1 mmol/L 4.1  4.4  5.2   Chloride 98 - 111 mmol/L 113  105  107   CO2 22 - 32 mmol/L 19  18  19   Calcium 8.9 - 10.3 mg/dL 8.8  8.9  9.8     Current antihypertensive regimen:  Metoprolol 50 mg every evening Entresto 24/26 mg twice daily Spironolactone 25 mg 1/2 tablet daily  How often are you checking your Blood Pressure? Patient is checking her blood pressures twice a week and home health is checking her blood pressures 3 times per week. She did have an episode 06/16/2021 of an elevated blood pressure with the home health nurse and she was sent to the ED, she was given 25 mg of hydralazine and patient states it brought her blood pressure back to a good range.and she was sent home with no other medication changes.    Current home BP readings: Patient is unsure of the home health blood pressure readings.  Patients blood pressure readings are today 102/80 and the previous readings were 123/78, 137/78, 134/82  What recent interventions/DTPs have been made by any provider to improve Blood Pressure control since last CPP Visit: No recent interventions  Any recent hospitalizations or ED visits since last visit with CPP? Patient was seen at [facility] ED on  06/16/2021 (7 hours) due to hypertension.   What diet changes have been made to improve Blood Pressure Control?  Patient follows a lower sodium diet Breakfast - patient will have cereal and banana or peach Lunch - patient will have a variety of salad, vegetables, macaroni or potato. Dinner - patient will have a meal with a meat and vegetable  What exercise is being done to improve your Blood Pressure Control?  Patient will walk with her walker and sitting exercises with physical therapy.  Adherence Review: Is the patient currently on ACE/ARB medication? No Does the patient   have >5 day gap between last estimated fill dates? No  Care Gaps: AWV - previous message sent to Ramond Craver Last BP - 179/74 on 06/16/2021 Eye exam - overdue Covid booster - overdue Shingrix - postponed  Star Rating Drugs: Atorvastatin 40 mg - last filled 05/18/2021 90 DS at [place] 10 mg - last filled 05/29/2021 30 DS at [place] Pharmacist Assistant [phone]

## 2021-06-24 ENCOUNTER — Ambulatory Visit (INDEPENDENT_AMBULATORY_CARE_PROVIDER_SITE_OTHER): Payer: Medicare Other

## 2021-06-24 VITALS — Ht 67.0 in | Wt 188.0 lb

## 2021-06-24 DIAGNOSIS — Z0000 Encounter for general adult medical examination without abnormal findings: Secondary | ICD-10-CM | POA: Diagnosis not present

## 2021-06-24 NOTE — Progress Notes (Signed)
Subjective:   Hannah [name] is a 81 y.o. female who presents for Medicare Annual (Subsequent) preventive examination.  Review of Systems    Virtual Visit via Telephone Note  I connected with  Hannah [name] on 06/24/21 at  3:15 PM EDT by telephone and verified that I am speaking with the correct person using two identifiers.  Location: Patient: Home Provider: Office Persons participating in the virtual visit: patient/Nurse Health Advisor   I discussed the limitations, risks, security and privacy concerns of performing an evaluation and management service by telephone and the availability of in person appointments. The patient expressed understanding and agreed to proceed.  Interactive audio and video telecommunications were attempted between this nurse and patient, however failed, due to patient having technical difficulties OR patient did not have access to video capability.  We continued and completed visit with audio only.  Some vital signs may be absent or patient reported.   Criselda Peaches, LPN  Cardiac Risk Factors include: advanced age (>75mn, >>3women);diabetes mellitus;hypertension     Objective:    Today's Vitals   06/24/21 1521 06/24/21 1522  Weight: 188 lb (85.3 kg)   Height: '5\' 7"'  (1.702 m)   PainSc:  0-No pain   Body mass index is 29.44 kg/m.     06/24/2021    3:36 PM 01/08/2021   12:15 PM 12/26/2020   12:15 AM 12/25/2020   11:55 AM 04/23/2020    2:22 AM 03/10/2020   11:26 AM 12/16/2018    3:00 AM  Advanced Directives  Does Patient Have a Medical Advance Directive? Yes No  No Yes Yes No  Type of [place]    [place] will Living will   Does patient want to make changes to medical advance directive? No - Patient declined    No - Patient declined    Copy of HTetlinin Chart? No - copy requested    No - copy requested    Would patient like information on creating  a medical advance directive?  No - Patient declined No - Patient declined    No - Patient declined    Current Medications (verified) Outpatient Encounter Medications as of 06/24/2021  Medication Sig   acetaminophen (TYLENOL) 500 MG tablet Take 500 mg by mouth every 6 (six) hours as needed for mild pain.    atorvastatin (LIPITOR) 40 MG tablet Take 1 tablet by mouth once daily   Blood Glucose Monitoring Suppl (ONETOUCH VERIO) w/Device KIT 1 each by Does not apply route as directed.   dapagliflozin propanediol (FARXIGA) 10 MG TABS tablet Take 1 tablet (10 mg total) by mouth daily before breakfast.   diclofenac Sodium (VOLTAREN) 1 % GEL Apply 4 g topically 4 (four) times daily.   furosemide (LASIX) 20 MG tablet Take 1 tablet (20 mg total) by mouth daily as needed for fluid or edema.   glucose blood (ONETOUCH VERIO) test strip USE 1 STRIP TO CHECK GLUCOSE ONCE DAILY AS DIRECTED   levothyroxine (EUTHYROX) 25 MCG tablet Take 1 tablet (25 mcg total) by mouth daily before breakfast.   metoprolol succinate (TOPROL-XL) 50 MG 24 hr tablet Take 1 tablet (50 mg total) by mouth every evening.   OneTouch Delica Lancets 355MMISC USE 1  TO CHECK GLUCOSE ONCE DAILY   pantoprazole (PROTONIX) 40 MG tablet Take 1 tablet (40 mg total) by mouth daily.   sacubitril-valsartan (ENTRESTO) 24-26 MG Take 1 tablet by mouth  2 (two) times daily.   spironolactone (ALDACTONE) 25 MG tablet Take 0.5 tablets (12.5 mg total) by mouth daily.   vitamin B-12 (CYANOCOBALAMIN) 1000 MCG tablet Take 1 tablet (1,000 mcg total) by mouth daily.   vitamin C (ASCORBIC ACID) 500 MG tablet Take 500 mg by mouth daily.    warfarin (COUMADIN) 5 MG tablet TAKE 1/2 TO 1 (ONE-HALF TO ONE) TABLET BY MOUTH ONCE DAILY AS  DIRECTED  BY  COUMADIN  CLINIC   No facility-administered encounter medications on file as of 06/24/2021.    Allergies (verified) Augmentin [amoxicillin-pot clavulanate], Tranxene [clorazepate], Crestor [rosuvastatin], and Penicillins    History: Past Medical History:  Diagnosis Date   Acute massive pulmonary embolism ([place]) 07/13/2012   Massive PE w/ PEA arrest 07/13/12 >TNK >IVC filter >discharged on comadin     Anticoagulated on warfarin    Chronic diastolic CHF (congestive heart failure) ([place]) 03/15/2017   Chronic kidney disease, stage 3a ([place]) 12/15/2018   Diabetes mellitus, type 2 ([place]) 07/15/2012   with peripheral neuropathy   DM2 (diabetes mellitus, type 2) ([place]) 07/23/2012   HFrEF (heart failure with reduced ejection fraction) ([place]) 03/15/2017   Non-ischemic cardiomyopathy // Cardiac catheterization 01/01/21:  RCA prox to mid 20 // Echocardiogram 12/26/20:  EF 30-35, ant septum/apex, mid inf-sept AK; ant-lat/ant/inf HK, Gr 1 DD, mild LVH, normal RVSF, RVSP 27.2, small effusion (ant to RV), mild MR, mod TR, AV sclerosis w/o AS   Hyperlipemia 07/14/2012   Hyperlipidemia 02/25/2013   Hypertension 07/14/2012   Hypothyroid 06/06/2013   Large bowel stricture ([place])    s/p colectomy in 2016   Neuropathy 02/12/2015   Osteoarthritis    s/p hip and knee replacements   PAF (paroxysmal atrial fibrillation) ([place])    Pulmonary hypertension ([place]) 07/26/2016   Syncope 10/2018   Thyroid disease    Past Surgical History:  Procedure Laterality Date   COLONOSCOPY N/A 03/12/2016   Procedure: COLONOSCOPY;  Surgeon: Teena Irani, MD;  Location: [place];  Service: Endoscopy;  Laterality: N/A;   COLOSTOMY N/A 06/03/2014   Procedure: COLOSTOMY;  Surgeon: Erroll Luna, MD;  Location: [place];  Service: General;  Laterality: N/A;   FLEXIBLE SIGMOIDOSCOPY N/A 05/30/2014   Procedure: Beryle Quant;  Surgeon: Carol Ada, MD;  Location: [facility] ENDOSCOPY;  Service: Endoscopy;  Laterality: N/A;   INSERTION OF VENA CAVA FILTER N/A 07/16/2012   Procedure: INSERTION OF VENA CAVA FILTER;  Surgeon: Elam Dutch, MD;  Location: [facility] CATH LAB;  Service: Cardiovascular;  Laterality: N/A;   KNEE ARTHROSCOPY     LEFT HEART CATH AND CORONARY  ANGIOGRAPHY N/A 01/01/2021   Procedure: LEFT HEART CATH AND CORONARY ANGIOGRAPHY;  Surgeon: Burnell Blanks, MD;  Location: [place] CV LAB;  Service: Cardiovascular;  Laterality: N/A;   PARTIAL COLECTOMY N/A 06/03/2014   Procedure: PARTIAL COLECTOMY;  Surgeon: Erroll Luna, MD;  Location: [place];  Service: General;  Laterality: N/A;   REDUCTION MAMMAPLASTY Bilateral    RIGHT HEART CATH N/A 12/30/2020   Procedure: RIGHT HEART CATH;  Surgeon: Burnell Blanks, MD;  Location: [place] CV LAB;  Service: Cardiovascular;  Laterality: N/A;   SP ARTHRO HIP*L*     Family History  Problem Relation Age of Onset   Breast cancer Mother    Social History   Socioeconomic History   Marital status: Widowed    Spouse name: Not on file   Number of children: Not on file   Years of education: Not on file  Highest education level: Not on file  Occupational History   Not on file  Tobacco Use   Smoking status: Former    Packs/day: 1.00    Years: 10.00    Total pack years: 10.00    Types: Cigarettes    Quit date: 01/04/1968    Years since quitting: 53.5   Smokeless tobacco: Never  Vaping Use   Vaping Use: Never used  Substance and Sexual Activity   Alcohol use: No   Drug use: No   Sexual activity: Not on file  Other Topics Concern   Not on file  Social History Narrative   Lives alone.          Social Determinants of Health   Financial Resource Strain: Low Risk  (06/24/2021)   Overall Financial Resource Strain (CARDIA)    Difficulty of Paying Living Expenses: Not hard at all  Food Insecurity: No Food Insecurity (06/24/2021)   Hunger Vital Sign    Worried About Running Out of Food in the Last Year: Never true    Ran Out of Food in the Last Year: Never true  Transportation Needs: No Transportation Needs (06/24/2021)   PRAPARE - Hydrologist (Medical): No    Lack of Transportation (Non-Medical): No  Physical Activity: Insufficiently Active  (06/24/2021)   Exercise Vital Sign    Days of Exercise per Week: 7 days    Minutes of Exercise per Session: 20 min  Stress: No Stress Concern Present (06/24/2021)   [place]    Feeling of Stress : Not at all  Social Connections: Moderately Integrated (06/24/2021)   Social Connection and Isolation Panel [NHANES]    Frequency of Communication with Friends and Family: More than three times a week    Frequency of Social Gatherings with Friends and Family: More than three times a week    Attends Religious Services: More than 4 times per year    Active Member of [employer] or Organizations: Yes    Attends Archivist Meetings: More than 4 times per year    Marital Status: Widowed       Clinical Intake:  Pre-visit preparation completed: NoNutrition Risk Assessment:  Has the patient had any N/V/D within the last 2 months?  No  Does the patient have any non-healing wounds?  No  Has the patient had any unintentional weight loss or weight gain?  No   Diabetes:  Is the patient diabetic?  Yes  If diabetic, was a CBG obtained today?  Yes  CBG 118 Taken by patient Did the patient bring in their glucometer from home?  No  How often do you monitor your CBG's? Daily.   Financial Strains and Diabetes Management:  Are you having any financial strains with the device, your supplies or your medication? No .  Does the patient want to be seen by Chronic Care Management for management of their diabetes?  No  Would the patient like to be referred to a Nutritionist or for Diabetic Management?  No   Diabetic Exams:  Diabetic Eye Exam: Completed Yes. Overdue for diabetic eye exam. Pt has been advised about the importance in completing this exam. A referral has been placed today. Message sent to referral coordinator for scheduling purposes. Advised pt to expect a call from office referred to regarding appt.  Diabetic Foot Exam:  Completed Yes. Pt has been advised about the importance in completing this exam. Pt is scheduled  for diabetic foot exam on Followed by PCP.    Pain : No/denies pain Pain Score: 0-No pain Diabetic?  Yes    Activities of Daily Living    06/24/2021    3:31 PM 12/26/2020   12:15 AM  In your present state of health, do you have any difficulty performing the following activities:  Hearing? 0 0  Vision? 0 0  Difficulty concentrating or making decisions? 0 0  Walking or climbing stairs? 1 0  Comment Uses a walker   Dressing or bathing? 1 0  Comment Aide and family assit   Doing errands, shopping? 1 0  Comment Family and aide Diplomatic Services operational officer and eating ? Y   Comment Family and aide assist   Using the Toilet? N   In the past six months, have you accidently leaked urine? N   Do you have problems with loss of bowel control? Y   Comment Uses a colostomy   Managing your Medications? Y   Comment Family and aide assist   Managing your Finances? N   Housekeeping or managing your Housekeeping? Y   Comment Family and aide assist     Patient Care Team: [name], MD (Inactive) as PCP - General (Family Medicine) Fay Records, MD as PCP - Cardiology (Cardiology) [name], [facility] as Pharmacist (Pharmacist) Sharmon Revere as Physician Assistant (Cardiology)  Indicate any recent Medical Services you may have received from other than [facility] providers in the past year (date may be approximate).     Assessment:   This is a routine wellness examination for Hannah [name].  Hearing/Vision screen Hearing Screening - Comments:: No hearing control Vision Screening - Comments:: Wears reading glasses.  Dietary issues and exercise activities discussed: Exercise limited by: None identified   Goals Addressed               This Visit's Progress     No current goals (pt-stated)         Depression Screen    06/24/2021    3:26 PM 01/13/2021   11:33 AM 03/10/2020   11:22  AM 10/30/2019   10:32 AM 09/13/2018    1:06 PM 10/28/2016    9:33 AM 04/10/2015    8:30 AM  PHQ 2/9 Scores  PHQ - 2 Score 0 0 0 0 0 0 0  PHQ- 9 Score  3         Fall Risk    06/24/2021    3:36 PM 01/13/2021   11:33 AM 03/10/2020   11:28 AM 10/25/2018   11:37 AM 10/28/2016    9:33 AM  [place] in the past year? 0 0 1 1 No  Number falls in past yr: 0 0 1 1   Injury with Fall? 0 0 0 1   Risk for fall due to : No Fall Risks  Impaired balance/gait;Impaired mobility;Impaired vision;Orthopedic patient Impaired balance/gait;Impaired mobility;History of fall(s)   Follow up   Falls prevention discussed Falls evaluation completed     FALL RISK PREVENTION PERTAINING TO THE HOME:  Any stairs in or around the home? Yes  If so, are there any without handrails? No  Home free of loose throw rugs in walkways, pet beds, electrical cords, etc? Yes  Adequate lighting in your home to reduce risk of falls? Yes   ASSISTIVE DEVICES UTILIZED TO PREVENT FALLS:  Life alert? Yes  Use of a cane, walker or w/c? Yes  Grab bars  in the bathroom? No  Shower chair or bench in shower? Yes  Elevated toilet seat or a handicapped toilet? Yes   TIMED UP AND GO:  Was the test performed? No . Audio Visit  Cognitive Function:        06/24/2021    3:37 PM 03/10/2020   11:31 AM 10/28/2016    9:34 AM  6CIT Screen  What Year? 0 points 0 points 0 points  What month? 0 points 0 points 0 points  What time? 0 points  0 points  Count back from 20 0 points 0 points 0 points  Months in reverse 0 points 4 points 0 points  Repeat phrase 0 points 2 points 0 points  Total Score 0 points  0 points    Immunizations Immunization History  Administered Date(s) Administered   Fluad Quad(high Dose 65+) 09/12/2018, 10/30/2019   Influenza Split 10/03/2012   Influenza Whole 10/04/2011   Influenza, High Dose Seasonal PF 11/12/2014, 10/21/2015, 10/20/2016, 09/19/2017   Influenza,inj,Quad PF,6+ Mos 11/07/2013    Influenza-Unspecified 10/03/2016   PFIZER(Purple Top)SARS-COV-2 Vaccination 02/26/2019, 03/09/2019, 12/20/2019   PNEUMOCOCCAL CONJUGATE-20 02/26/2021   Pneumococcal Conjugate-13 04/07/2014, 10/20/2016   Pneumococcal-Unspecified 10/04/2011   Tdap 04/07/2014   Zoster, Live 07/23/2015    TDAP status: Up to date    Pneumococcal vaccine status: Completed during today's visit.  Covid-19 vaccine status: Completed vaccines  Qualifies for Shingles Vaccine? Yes   Zostavax completed No   Shingrix Completed?: No.    Education has been provided regarding the importance of this vaccine. Patient has been advised to call insurance company to determine out of pocket expense if they have not yet received this vaccine. Advised may also receive vaccine at local pharmacy or Health Dept. Verbalized acceptance and understanding.  Screening Tests Health Maintenance  Topic Date Due   OPHTHALMOLOGY EXAM  04/04/2019   COVID-19 Vaccine (4 - Booster for Pfizer series) 07/10/2021 (Originally 02/14/2020)   Zoster Vaccines- Shingrix (1 of 2) 07/21/2021 (Originally 05/14/1959)   INFLUENZA VACCINE  08/03/2021   HEMOGLOBIN A1C  08/15/2021   FOOT EXAM  06/11/2022   TETANUS/TDAP  04/06/2024   Pneumonia Vaccine 18+ Years old  Completed   DEXA SCAN  Completed   HPV VACCINES  Aged Out    Health Maintenance  Health Maintenance Due  Topic Date Due   OPHTHALMOLOGY EXAM  04/04/2019    Colorectal cancer screening: No longer required.   Mammogram status: No longer required due to age.  Bone Density status: Completed 04/09/14. Results reflect: Bone density results: OSTEOPOROSIS. Repeat every   years.  Lung Cancer Screening: (Low Dose CT Chest recommended if Age 40-80 years, 30 pack-year currently smoking OR have quit w/in 15years.) does not qualify.     Additional Screening:  Hepatitis C Screening: does not qualify; Completed   Vision Screening: Recommended annual ophthalmology exams for early detection of  glaucoma and other disorders of the eye. Is the patient up to date with their annual eye exam?  Yes  Who is the provider or what is the name of the office in which the patient attends annual eye exams? Patient unsure If pt is not established with a provider, would they like to be referred to a provider to establish care? No .   Dental Screening: Recommended annual dental exams for proper oral hygiene  Community Resource Referral / Chronic Care Management:   CRR required this visit?  No   CCM required this visit?  No      Plan:  I have personally reviewed and noted the following in the patient's chart:   Medical and social history Use of alcohol, tobacco or illicit drugs  Current medications and supplements including opioid prescriptions.  Functional ability and status Nutritional status Physical activity Advanced directives List of other physicians Hospitalizations, surgeries, and ER visits in previous 12 months Vitals Screenings to include cognitive, depression, and falls Referrals and appointments  In addition, I have reviewed and discussed with patient certain preventive protocols, quality metrics, and best practice recommendations. A written personalized care plan for preventive services as well as general preventive health recommendations were provided to patient.     Criselda Peaches, LPN   [id number]   Nurse Notes: None

## 2021-06-24 NOTE — Patient Instructions (Signed)
Hannah [name] , Thank you for taking time to come for your Medicare Wellness Visit. I appreciate your ongoing commitment to your health goals. Please review the following plan we discussed and let me know if I can assist you in the future.   These are the goals we discussed:  Goals       Manage My Medicine      Timeframe:  Short-Term Goal Priority:  High Start Date:                             Expected End Date:                       Follow Up Date 09/22/20    - call for medicine refill 2 or 3 days before it runs out - keep a list of all the medicines I take; vitamins and herbals too - use a pillbox to sort medicine    Why is this important?   These steps will help you keep on track with your medicines.   Notes:       No current goals (pt-stated)      patient      To maintain exercise at the [employer]!      Patient Stated      Continue to stay healthy        This is a list of the screening recommended for you and due dates:  Health Maintenance  Topic Date Due   Eye exam for diabetics  04/04/2019   COVID-19 Vaccine (4 - Booster for Pfizer series) 07/10/2021*   Zoster (Shingles) Vaccine (1 of 2) 07/21/2021*   Flu Shot  08/03/2021   Hemoglobin A1C  08/15/2021   Complete foot exam   06/11/2022   Tetanus Vaccine  04/06/2024   Pneumonia Vaccine  Completed   DEXA scan (bone density measurement)  Completed   HPV Vaccine  Aged Out  *Topic was postponed. The date shown is not the original due date.   Advanced directives: Yes  Conditions/risks identified: None  Next appointment: Follow up in one year for your annual wellness visit    Preventive Care 65 Years and Older, Female Preventive care refers to lifestyle choices and visits with your health care provider that can promote health and wellness. What does preventive care include? A yearly physical exam. This is also called an annual well check. Dental exams once or twice a year. Routine eye exams. Ask your health care  provider how often you should have your eyes checked. Personal lifestyle choices, including: Daily care of your teeth and gums. Regular physical activity. Eating a healthy diet. Avoiding tobacco and drug use. Limiting alcohol use. Practicing safe sex. Taking low-dose aspirin every day. Taking vitamin and mineral supplements as recommended by your health care provider. What happens during an annual well check? The services and screenings done by your health care provider during your annual well check will depend on your age, overall health, lifestyle risk factors, and family history of disease. Counseling  Your health care provider may ask you questions about your: Alcohol use. Tobacco use. Drug use. Emotional well-being. Home and relationship well-being. Sexual activity. Eating habits. History of falls. Memory and ability to understand (cognition). Work and work Statistician. Reproductive health. Screening  You may have the following tests or measurements: Height, weight, and BMI. Blood pressure. Lipid and cholesterol levels. These may be checked every 5 years, or more frequently  if you are over 55 years old. Skin check. Lung cancer screening. You may have this screening every year starting at age 66 if you have a 30-pack-year history of smoking and currently smoke or have quit within the past 15 years. Fecal occult blood test (FOBT) of the stool. You may have this test every year starting at age 35. Flexible sigmoidoscopy or colonoscopy. You may have a sigmoidoscopy every 5 years or a colonoscopy every 10 years starting at age 62. Hepatitis C blood test. Hepatitis B blood test. Sexually transmitted disease (STD) testing. Diabetes screening. This is done by checking your blood sugar (glucose) after you have not eaten for a while (fasting). You may have this done every 1-3 years. Bone density scan. This is done to screen for osteoporosis. You may have this done starting at age  24. Mammogram. This may be done every 1-2 years. Talk to your health care provider about how often you should have regular mammograms. Talk with your health care provider about your test results, treatment options, and if necessary, the need for more tests. Vaccines  Your health care provider may recommend certain vaccines, such as: Influenza vaccine. This is recommended every year. Tetanus, diphtheria, and acellular pertussis (Tdap, Td) vaccine. You may need a Td booster every 10 years. Zoster vaccine. You may need this after age 31. Pneumococcal 13-valent conjugate (PCV13) vaccine. One dose is recommended after age 38. Pneumococcal polysaccharide (PPSV23) vaccine. One dose is recommended after age 71. Talk to your health care provider about which screenings and vaccines you need and how often you need them. This information is not intended to replace advice given to you by your health care provider. Make sure you discuss any questions you have with your health care provider. Document Released: 01/16/2015 Document Revised: 09/09/2015 Document Reviewed: 10/21/2014 Elsevier Interactive Patient Education  2017 [employer].  Fall Prevention in the Home Falls can cause injuries. They can happen to people of all ages. There are many things you can do to make your home safe and to help prevent falls. What can I do on the outside of my home? Regularly fix the edges of walkways and driveways and fix any cracks. Remove anything that might make you trip as you walk through a door, such as a raised step or threshold. Trim any bushes or trees on the path to your home. Use bright outdoor lighting. Clear any walking paths of anything that might make someone trip, such as rocks or tools. Regularly check to see if handrails are loose or broken. Make sure that both sides of any steps have handrails. Any raised decks and porches should have guardrails on the edges. Have any leaves, snow, or ice cleared  regularly. Use sand or salt on walking paths during winter. Clean up any spills in your garage right away. This includes oil or grease spills. What can I do in the bathroom? Use night lights. Install grab bars by the toilet and in the tub and shower. Do not use towel bars as grab bars. Use non-skid mats or decals in the tub or shower. If you need to sit down in the shower, use a plastic, non-slip stool. Keep the floor dry. Clean up any water that spills on the floor as soon as it happens. Remove soap buildup in the tub or shower regularly. Attach bath mats securely with double-sided non-slip rug tape. Do not have throw rugs and other things on the floor that can make you trip. What can I  do in the bedroom? Use night lights. Make sure that you have a light by your bed that is easy to reach. Do not use any sheets or blankets that are too big for your bed. They should not hang down onto the floor. Have a firm chair that has side arms. You can use this for support while you get dressed. Do not have throw rugs and other things on the floor that can make you trip. What can I do in the kitchen? Clean up any spills right away. Avoid walking on wet floors. Keep items that you use a lot in easy-to-reach places. If you need to reach something above you, use a strong step stool that has a grab bar. Keep electrical cords out of the way. Do not use floor polish or wax that makes floors slippery. If you must use wax, use non-skid floor wax. Do not have throw rugs and other things on the floor that can make you trip. What can I do with my stairs? Do not leave any items on the stairs. Make sure that there are handrails on both sides of the stairs and use them. Fix handrails that are broken or loose. Make sure that handrails are as long as the stairways. Check any carpeting to make sure that it is firmly attached to the stairs. Fix any carpet that is loose or worn. Avoid having throw rugs at the top or  bottom of the stairs. If you do have throw rugs, attach them to the floor with carpet tape. Make sure that you have a light switch at the top of the stairs and the bottom of the stairs. If you do not have them, ask someone to add them for you. What else can I do to help prevent falls? Wear shoes that: Do not have high heels. Have rubber bottoms. Are comfortable and fit you well. Are closed at the toe. Do not wear sandals. If you use a stepladder: Make sure that it is fully opened. Do not climb a closed stepladder. Make sure that both sides of the stepladder are locked into place. Ask someone to hold it for you, if possible. Clearly mark and make sure that you can see: Any grab bars or handrails. First and last steps. Where the edge of each step is. Use tools that help you move around (mobility aids) if they are needed. These include: Canes. Walkers. Scooters. Crutches. Turn on the lights when you go into a dark area. Replace any light bulbs as soon as they burn out. Set up your furniture so you have a clear path. Avoid moving your furniture around. If any of your floors are uneven, fix them. If there are any pets around you, be aware of where they are. Review your medicines with your doctor. Some medicines can make you feel dizzy. This can increase your chance of falling. Ask your doctor what other things that you can do to help prevent falls. This information is not intended to replace advice given to you by your health care provider. Make sure you discuss any questions you have with your health care provider. Document Released: 10/16/2008 Document Revised: 05/28/2015 Document Reviewed: 01/24/2014 Elsevier Interactive Patient Education  2017 [employer].

## 2021-07-07 ENCOUNTER — Ambulatory Visit: Payer: Medicare Other | Admitting: *Deleted

## 2021-07-07 DIAGNOSIS — Z5181 Encounter for therapeutic drug level monitoring: Secondary | ICD-10-CM | POA: Diagnosis not present

## 2021-07-07 DIAGNOSIS — I2699 Other pulmonary embolism without acute cor pulmonale: Secondary | ICD-10-CM

## 2021-07-07 LAB — POCT INR: INR: 3 (ref 2.0–3.0)

## 2021-07-07 NOTE — Patient Instructions (Addendum)
Description   Continue taking Warfarin 1/2 tablet daily except for 1 tablet on Mondays and Fridays. Keep a green leafy veggie in your diet 2-3 times a week. Recheck INR in 6 weeks.  [facility] [phone].

## 2021-07-27 ENCOUNTER — Telehealth: Payer: Self-pay | Admitting: Pharmacist

## 2021-07-27 ENCOUNTER — Telehealth: Payer: Self-pay | Admitting: Family Medicine

## 2021-07-27 MED ORDER — ENTRESTO 24-26 MG PO TABS: 1.0000 | ORAL_TABLET | Freq: Two times a day (BID) | ORAL | 0 refills | Status: DC

## 2021-07-27 NOTE — Telephone Encounter (Signed)
Pt has an appt with dr [name] on 08-19-2021 and would like a refill on sacubitril-valsartan [facility]) [address] [facility]  [id number] - [place] ([place]), [place] - [address] Phone:  [phone]  Fax:  [phone]

## 2021-07-27 NOTE — Chronic Care Management (AMB) (Signed)
    Chronic Care Management Pharmacy Assistant   Name: Hannah [name]  MRN: [id number] DOB: Apr 24, 1940  Reason for Encounter: Follow up [name] patient assistance application   Spoke with patient to see if she received the pap for [facility], she states she has not received this in the mail.   We have mailed the pap application to her 3 times and she is not receiving them.  Patient will have her grandson come to the office tomorrow to pick up the [facility] application and take it to her to complete.   [name] [facility]  Clinical Pharmacist Assistant [phone]

## 2021-07-27 NOTE — Telephone Encounter (Signed)
Rx done. 

## 2021-08-06 DIAGNOSIS — E119 Type 2 diabetes mellitus without complications: Secondary | ICD-10-CM | POA: Diagnosis not present

## 2021-08-06 DIAGNOSIS — Z933 Colostomy status: Secondary | ICD-10-CM | POA: Diagnosis not present

## 2021-08-06 DIAGNOSIS — Z4801 Encounter for change or removal of surgical wound dressing: Secondary | ICD-10-CM | POA: Diagnosis not present

## 2021-08-06 DIAGNOSIS — K56609 Unspecified intestinal obstruction, unspecified as to partial versus complete obstruction: Secondary | ICD-10-CM | POA: Diagnosis not present

## 2021-08-18 ENCOUNTER — Ambulatory Visit: Payer: Medicare Other

## 2021-08-18 DIAGNOSIS — Z5181 Encounter for therapeutic drug level monitoring: Secondary | ICD-10-CM

## 2021-08-18 DIAGNOSIS — I2699 Other pulmonary embolism without acute cor pulmonale: Secondary | ICD-10-CM

## 2021-08-18 LAB — POCT INR: INR: 2.5 (ref 2.0–3.0)

## 2021-08-18 NOTE — Patient Instructions (Signed)
Description   Continue taking Warfarin 1/2 tablet daily except for 1 tablet on Mondays and Fridays. Keep a green leafy veggie in your diet 2-3 times a week. Recheck INR in 6 weeks.  [facility] [phone].

## 2021-08-19 ENCOUNTER — Ambulatory Visit (INDEPENDENT_AMBULATORY_CARE_PROVIDER_SITE_OTHER): Payer: Medicare Other | Admitting: Family Medicine

## 2021-08-19 ENCOUNTER — Encounter: Payer: Self-pay | Admitting: Family Medicine

## 2021-08-19 VITALS — BP 110/68 | HR 72 | Temp 97.7°F | Ht 67.0 in | Wt 195.3 lb

## 2021-08-19 DIAGNOSIS — E039 Hypothyroidism, unspecified: Secondary | ICD-10-CM

## 2021-08-19 DIAGNOSIS — I5020 Unspecified systolic (congestive) heart failure: Secondary | ICD-10-CM | POA: Diagnosis not present

## 2021-08-19 DIAGNOSIS — E1142 Type 2 diabetes mellitus with diabetic polyneuropathy: Secondary | ICD-10-CM | POA: Diagnosis not present

## 2021-08-19 LAB — POCT GLYCOSYLATED HEMOGLOBIN (HGB A1C): Hemoglobin A1C: 6.7 % — AB (ref 4.0–5.6)

## 2021-08-19 LAB — TSH: TSH: 2 u[IU]/mL (ref 0.35–5.50)

## 2021-08-19 MED ORDER — LEVOTHYROXINE SODIUM 25 MCG PO TABS: 25.0000 ug | ORAL_TABLET | Freq: Every day | ORAL | 1 refills | Status: DC

## 2021-08-19 NOTE — Progress Notes (Signed)
TSH is well controlled,

## 2021-08-19 NOTE — Patient Instructions (Signed)
Continue to weigh your self 3 times per week.

## 2021-08-19 NOTE — Progress Notes (Signed)
Established Patient Office Visit  Subjective   Patient ID: Hannah [name], female    DOB: 1941-01-03  Age: 81 y.o. MRN: [id number]  Chief Complaint  Patient presents with   Establish Care    Patient is here for transition of care visit.    Patient reports that she stopped taking the entresto because it was too expensive. Has been off the medication for a few months. States that since she stopped taking it she has felt fine, no difference in her symptoms off the medication. States her next appointment with her cardiologist is in October. I reviewed her last ECHO which showed improved EF of 60-65% (last ECHO showed EF of 35%) I reviewed the rest of her medications. Pt denies any chest pain, no dizziness or SOB, she also denies any ankle swelling.   DM-- A1C performed in office today, she reports she is taking her 10 mg farxiga daily as prescribed, she denies any side effects, no dysuria or unusual discharge. I reviewed her most recent lab work including kidney function.    Abdominal discomfort-- patient reports she is s/p partial colectomy in 2016. She has an colostomy bag, states her BM's are regular, but sometimes she feels some abdomina discomfort and "hardness" in her left lower abdomen. She denies any blood in her stool, no nausea or vomiting, no fever/chills. It is intermittent, relieved with BM.    Patient Active Problem List   Diagnosis Date Noted   Elevated troponin    Dilated cardiomyopathy ([place])    Ischemic cardiomyopathy    PAF (paroxysmal atrial fibrillation) (HCC)    Non-ST elevation (NSTEMI) myocardial infarction ([place]) 12/25/2020   Thrombocytopenia ([place]) 04/23/2020   CAD (coronary artery disease) 06/24/2019   Hypertension 04/25/2019   Chest pain with elevated troponin  12/15/2018   Chronic kidney disease, stage 3a ([place]) 12/15/2018   Frequent falls 05/21/2018   HFimpEF (heart failure with improved ejection fraction) ([place]) 03/15/2017   Anticoagulated on warfarin     Pulmonary hypertension ([place]) 07/26/2016   Neuropathy 02/12/2015   History of pulmonary embolism 06/10/2013   Hypothyroid 06/06/2013   Diabetes mellitus with neurological manifestations, controlled ([place]) 06/06/2013   Hyperlipidemia 02/25/2013   DM2 (diabetes mellitus, type 2) ([place]) 07/23/2012     Current Outpatient Medications  Medication Instructions   acetaminophen (TYLENOL) 500 mg, Oral, Every 6 hours PRN   ascorbic acid (VITAMIN C) 500 mg, Oral, Daily   atorvastatin (LIPITOR) 40 MG tablet Take 1 tablet by mouth once daily   Blood Glucose Monitoring Suppl (ONETOUCH VERIO) w/Device KIT 1 each, Does not apply, As directed   cyanocobalamin (VITAMIN B12) 1,000 mcg, Oral, Daily   dapagliflozin propanediol (FARXIGA) 10 mg, Oral, Daily before breakfast   diclofenac Sodium (VOLTAREN) 4 g, Topical, 4 times daily   furosemide (LASIX) 20 mg, Oral, Daily PRN   glucose blood (ONETOUCH VERIO) test strip USE 1 STRIP TO CHECK GLUCOSE ONCE DAILY AS DIRECTED   levothyroxine (EUTHYROX) 25 mcg, Oral, Daily before breakfast   metoprolol succinate (TOPROL-XL) 50 mg, Oral, Every evening   OneTouch Delica Lancets 26Z MISC USE 1  TO CHECK GLUCOSE ONCE DAILY   pantoprazole (PROTONIX) 40 mg, Oral, Daily   spironolactone (ALDACTONE) 12.5 mg, Oral, Daily   warfarin (COUMADIN) 5 MG tablet TAKE 1/2 TO 1 (ONE-HALF TO ONE) TABLET BY MOUTH ONCE DAILY AS  DIRECTED  BY  COUMADIN  CLINIC    Review of Systems  All other systems reviewed and are negative.  Objective:     BP 110/68 (BP Location: Right Arm, Patient Position: Sitting, Cuff Size: Large)   Pulse 72   Temp 97.7 F (36.5 C) (Oral)   Ht '5\' 7"'  (1.702 m)   Wt 195 lb 4.8 oz (88.6 kg)   SpO2 99%   BMI 30.59 kg/m  BP Readings from Last 3 Encounters:  08/19/21 110/68  06/16/21 (!) 179/74  05/21/21 116/63      Physical Exam Vitals reviewed.  Constitutional:      Appearance: Normal appearance. She is well-groomed and normal weight.  HENT:      Head: Normocephalic and atraumatic.     Mouth/Throat:     Mouth: Mucous membranes are moist.     Pharynx: Oropharynx is clear.  Eyes:     Extraocular Movements: Extraocular movements intact.     Conjunctiva/sclera: Conjunctivae normal.     Pupils: Pupils are equal, round, and reactive to light.  Cardiovascular:     Rate and Rhythm: Normal rate and regular rhythm.     Pulses: Normal pulses.     Heart sounds: S1 normal and S2 normal.  Pulmonary:     Effort: Pulmonary effort is normal.     Breath sounds: Normal breath sounds and air entry.  Abdominal:     General: The ostomy site is clean. Bowel sounds are normal.     Palpations: Abdomen is soft.  Musculoskeletal:     Right lower leg: No edema.     Left lower leg: No edema.  Skin:    General: Skin is warm and dry.  Neurological:     Mental Status: She is alert and oriented to person, place, and time. Mental status is at baseline.     Gait: Gait is intact.  Psychiatric:        Mood and Affect: Mood and affect normal.        Speech: Speech normal.        Behavior: Behavior normal.        Judgment: Judgment normal.      Results for orders placed or performed in visit on 08/19/21  TSH  Result Value Ref Range   TSH 2.00 0.35 - 5.50 uIU/mL  POC HgB A1c  Result Value Ref Range   Hemoglobin A1C 6.7 (A) 4.0 - 5.6 %   HbA1c POC (<> result, manual entry)     HbA1c, POC (prediabetic range)     HbA1c, POC (controlled diabetic range)      Last metabolic panel Lab Results  Component Value Date   GLUCOSE 86 06/16/2021   NA 140 06/16/2021   K 4.1 06/16/2021   CL 113 (H) 06/16/2021   CO2 19 (L) 06/16/2021   BUN 28 (H) 06/16/2021   CREATININE 1.49 (H) 06/16/2021   GFRNONAA 35 (L) 06/16/2021   CALCIUM 8.8 (L) 06/16/2021   PHOS 3.9 06/09/2014   PROT 7.8 02/15/2021   ALBUMIN 4.3 02/15/2021   BILITOT 0.6 02/15/2021   ALKPHOS 57 02/15/2021   AST 16 02/15/2021   ALT 8 02/15/2021   ANIONGAP 8 06/16/2021   Last lipids Lab  Results  Component Value Date   CHOL 150 02/15/2021   HDL 67.50 02/15/2021   LDLCALC 66 02/15/2021   TRIG 80.0 02/15/2021   CHOLHDL 2 02/15/2021      The ASCVD Risk score (Arnett DK, et al., 2019) failed to calculate for the following reasons:   The 2019 ASCVD risk score is only valid for ages 66 to 79  The patient has a prior MI or stroke diagnosis    Assessment & Plan:   Problem List Items Addressed This Visit       Cardiovascular and Mediastinum   HFimpEF (heart failure with improved ejection fraction) [facility])    Reviewed cardiology notes, most recent ECHO showed normal EF 60-65%. We discussed that with the discontinuation of entresto she might experience CHF symptoms. She was educated on the warning signs of acute CHF and handouts were given. She has a home health nurse who comes in 2 times a week and weighs her. I advised she continue to weight herself regularly and if she has any SOB or leg swelling to notify either me or her cardiologist.         Endocrine   DM2 (diabetes mellitus, type 2) ([place]) - Primary    Well controlled on the farxiga 10 mg daily, continue this medication, she was reminded that she is due for her annual eye exam.      Relevant Orders   POC HgB A1c (Completed)   Hypothyroid    On 25 mcg of levothyroxine daily, she is due for a new TSH level today.      Relevant Medications   levothyroxine (EUTHYROX) 25 MCG tablet   Other Relevant Orders   TSH (Completed)   I spent a total of 45 minutes reviewing the patient's complicated past medical history, reviewing medications, specialists' notes, ECHO reports, and I also gave her CHF education this visit.  Return in about 6 months (around 02/19/2022) for follow up DM.    Farrel Conners, MD

## 2021-08-20 DIAGNOSIS — K56609 Unspecified intestinal obstruction, unspecified as to partial versus complete obstruction: Secondary | ICD-10-CM | POA: Diagnosis not present

## 2021-08-20 DIAGNOSIS — Z4801 Encounter for change or removal of surgical wound dressing: Secondary | ICD-10-CM | POA: Diagnosis not present

## 2021-08-20 DIAGNOSIS — Z933 Colostomy status: Secondary | ICD-10-CM | POA: Diagnosis not present

## 2021-08-20 DIAGNOSIS — E119 Type 2 diabetes mellitus without complications: Secondary | ICD-10-CM | POA: Diagnosis not present

## 2021-08-20 NOTE — Assessment & Plan Note (Addendum)
Well controlled on the farxiga 10 mg daily, continue this medication, she was reminded that she is due for her annual eye exam.

## 2021-08-20 NOTE — Assessment & Plan Note (Signed)
On 25 mcg of levothyroxine daily, she is due for a new TSH level today.

## 2021-08-20 NOTE — Assessment & Plan Note (Signed)
Reviewed cardiology notes, most recent ECHO showed normal EF 60-65%. We discussed that with the discontinuation of entresto she might experience CHF symptoms. She was educated on the warning signs of acute CHF and handouts were given. She has a home health nurse who comes in 2 times a week and weighs her. I advised she continue to weight herself regularly and if she has any SOB or leg swelling to notify either me or her cardiologist.

## 2021-08-24 ENCOUNTER — Telehealth: Payer: Self-pay | Admitting: Pharmacist

## 2021-08-24 NOTE — Chronic Care Management (AMB) (Signed)
    Chronic Care Management Pharmacy Assistant   Name: [name]  MRN: [id number] DOB: 10/23/1940  08/25/2021 APPOINTMENT REMINDER  Hannah [name] was reminded to have all medications, supplements and any blood glucose and blood pressure readings available for review with Gaylord Shih, Pharm. D, at her telephone visit on 08/25/2021 at 12:00.  Care Gaps: AWV - scheduled 06/29/2022 Last BP - 110/68 on 08/19/2021 Last A1C - 6.7 on 08/19/2021 [place] - overdue Covid booster - postponed Flu - postponed Eye exam - postponed Shingix - postponed  Star Rating Drug: Atorvastatin 40 mg - last filled 05/18/2021 90 DS at [facility] verified with Regino Bellow 10 mg - last filled 05/29/2021 30 DS at [facility] verified with Kathie Rhodes  Any gaps in medications fill history? Yes  [name] [facility]  Clinical Pharmacist Assistant [phone]

## 2021-08-25 ENCOUNTER — Ambulatory Visit: Payer: Medicare Other | Admitting: Pharmacist

## 2021-08-25 DIAGNOSIS — E1142 Type 2 diabetes mellitus with diabetic polyneuropathy: Secondary | ICD-10-CM

## 2021-08-25 DIAGNOSIS — I10 Essential (primary) hypertension: Secondary | ICD-10-CM

## 2021-08-25 DIAGNOSIS — I5020 Unspecified systolic (congestive) heart failure: Secondary | ICD-10-CM

## 2021-08-25 NOTE — Progress Notes (Signed)
Chronic Care Management Pharmacy Note  08/26/2021 Name:  Bradyn Vassey MRN:  [id number] DOB:  May 26, 1940  Summary: BP not ideally at goal < 140/90 per home readings Pt stopped Entresto due to cost  Recommendations/Changes made from today's visit: -Recommended restarting Entresto and will apply for PAP -Recommended regular continued BP monitoring at home  Plan: Follow up in 1 week in office to apply for Entresto PAP   Subjective: Hannah [name] is an 81 y.o. year old female who is a primary patient of Legrand Como, Royston Cowper, MD.  The CCM team was consulted for assistance with disease management and care coordination needs.    Engaged with patient by telephone for follow up visit in response to provider referral for pharmacy case management and/or care coordination services.   Consent to Services:  The patient was given information about Chronic Care Management services, agreed to services, and gave verbal consent prior to initiation of services.  Please see initial visit note for detailed documentation.   Patient Care Team: Farrel Conners, MD as PCP - General (Family Medicine) Fay Records, MD as PCP - Cardiology (Cardiology) [name], [facility] as Pharmacist (Pharmacist) Sharmon Revere as Physician Assistant (Cardiology)  Recent office visits: 08/19/21 Loralyn Freshwater, MD: Patient presented to establish care. No medication changes made. Removed Entresto from medication list as this was too expensive. Follow up in 6 months.  06/24/21 Rolene Arbour, LPN: Patient presented for AWV.  05/21/2021 Micheline Rough MD - Patient was seen for Primary hypertension and additional issues. Referral to Podiatry. No medication changes. Follow up in 3 months.   04/21/2021 Micheline Rough MD - Patient was seen for Type 2 diabetes mellitus with diabetic polyneuropathy, without long-term current use of insulin and additional issues. No medication changes. Follow up in 4 months.   02/26/21  Micheline Rough MD: Patient presented for HFrEF and chronic conditions follow up. D/c'd metformin due to use of [place]. Administered Prevnar20.  Recent consult visits: 08/18/21 Ovidio Kin, RN (cardiology): Patient presented for anticoag visit. INR 2.5, goal 2.5-3.5. Continued 5 mg (5 mg x 1) every Mon, Fri; 2.5 mg (5 mg x 0.5) all other days.  06/10/2021 Celesta Gentile DPM(podiatry) - Patient was seen for Dermatophytosis of nail and additional issues. No medication changes. Follow up in 3 months.    05/21/2021 Dorris Carnes MD (cardiology) - Patient was seen for hypertension unspecified type. No medication changes. Follow up in 6 months.   03/23/21 Telephone encounter (cardiology): Prescribed spironolactone 12.5 mg once daily. Plan for BMET in 1 week and 2 weeks. Schedule ECHO for 6-8 weeks.  03/16/21 Candance Hemphill, RN (cardiology): Patient presented for anticoag visit. INR 3.5, goal 2.5-3.5. Continued 5 mg (5 mg x 1) every Mon, Fri; 2.5 mg (5 mg x 0.5) all other days.  Hospital visits: Patient was seen at [facility] ED on 06/16/2021 (7 hours) due to hypertension.    New?Medications Started at [facility] Discharge:?? No medications started Medication Changes at Hospital Discharge: No medications changed Medications Discontinued at Hospital Discharge: No medications discontinued Medications that remain the same after Hospital Discharge:??  -All other medications will remain the same.     Objective:  Lab Results  Component Value Date   CREATININE 1.49 (H) 06/16/2021   BUN 28 (H) 06/16/2021   GFR 37.65 (L) 02/15/2021   GFRNONAA 35 (L) 06/16/2021   GFRAA 45 (L) 12/03/2019   NA 140 06/16/2021   K 4.1 06/16/2021   CALCIUM 8.8 (L)  06/16/2021   CO2 19 (L) 06/16/2021   GLUCOSE 86 06/16/2021    Lab Results  Component Value Date/Time   HGBA1C 6.7 (A) 08/19/2021 01:18 PM   HGBA1C 6.3 02/15/2021 01:01 PM   HGBA1C 6.7 (H) 08/19/2020 03:16 PM   GFR 37.65 (L) 02/15/2021  01:01 PM   GFR 53.04 (L) 08/19/2020 03:16 PM   MICROALBUR 4.3 (H) 09/12/2018 03:07 PM   MICROALBUR 6.2 (H) 04/10/2015 09:01 AM    Last diabetic Eye exam:  Lab Results  Component Value Date/Time   HMDIABEYEEXA No Retinopathy 06/23/2015 12:00 AM    Last diabetic Foot exam: No results found for: "HMDIABFOOTEX"   Lab Results  Component Value Date   CHOL 150 02/15/2021   HDL 67.50 02/15/2021   LDLCALC 66 02/15/2021   TRIG 80.0 02/15/2021   CHOLHDL 2 02/15/2021       Latest Ref Rng & Units 02/15/2021    1:01 PM 01/08/2021    8:15 AM 12/28/2020    1:45 AM  Hepatic Function  Total Protein 6.0 - 8.3 g/dL 7.8  5.8  5.8   Albumin 3.5 - 5.2 g/dL 4.3  2.8  2.6   AST 0 - 37 U/L '16  18  30   ' ALT 0 - 35 U/L '8  13  13   ' Alk Phosphatase 39 - 117 U/L 57  42  42   Total Bilirubin 0.2 - 1.2 mg/dL 0.6  1.0  0.4     Lab Results  Component Value Date/Time   TSH 2.00 08/19/2021 02:02 PM   TSH 2.93 08/19/2020 03:16 PM   FREET4 1.09 02/25/2013 12:23 PM       Latest Ref Rng & Units 06/16/2021    1:15 PM 02/15/2021    1:01 PM 01/08/2021    8:21 AM  CBC  WBC 4.0 - 10.5 K/uL 9.4  9.2    Hemoglobin 12.0 - 15.0 g/dL 12.4  13.3  13.6   Hematocrit 36.0 - 46.0 % 41.8  43.1  40.0   Platelets 150 - 400 K/uL 305  241.0      No results found for: "VD25OH"  Clinical ASCVD: No  The ASCVD Risk score (Arnett DK, et al., 2019) failed to calculate for the following reasons:   The 2019 ASCVD risk score is only valid for ages 20 to 33   The patient has a prior MI or stroke diagnosis       06/24/2021    3:26 PM 01/13/2021   11:33 AM 03/10/2020   11:22 AM  Depression screen PHQ 2/9  Decreased Interest 0 0 0  Down, Depressed, Hopeless 0 0 0  PHQ - 2 Score 0 0 0  Altered sleeping  0   Tired, decreased energy  0   Change in appetite  0   Feeling bad or failure about yourself   3   Suicidal thoughts  0   PHQ-9 Score  3     Social History   Tobacco Use  Smoking Status Former   Packs/day: 1.00    Years: 10.00   Total pack years: 10.00   Types: Cigarettes   Quit date: 01/04/1968   Years since quitting: 53.6  Smokeless Tobacco Never   BP Readings from Last 3 Encounters:  08/19/21 110/68  06/16/21 (!) 179/74  05/21/21 116/63   Pulse Readings from Last 3 Encounters:  08/19/21 72  06/16/21 77  05/21/21 67   Wt Readings from Last 3 Encounters:  08/19/21 195 lb 4.8 oz (  88.6 kg)  06/24/21 188 lb (85.3 kg)  06/16/21 177 lb (80.3 kg)   BMI Readings from Last 3 Encounters:  08/19/21 30.59 kg/m  06/24/21 29.44 kg/m  06/16/21 27.72 kg/m    Assessment/Interventions: Review of patient past medical history, allergies, medications, health status, including review of consultants reports, laboratory and other test data, was performed as part of comprehensive evaluation and provision of chronic care management services.   SDOH:  (Social Determinants of Health) assessments and interventions performed: No     SDOH Screenings   Alcohol Screen: Low Risk  (06/24/2021)   Alcohol Screen    Last Alcohol Screening Score (AUDIT): 0  Depression (PHQ2-9): Low Risk  (06/24/2021)   Depression (PHQ2-9)    PHQ-2 Score: 0  Financial Resource Strain: Low Risk  (06/24/2021)   Overall Financial Resource Strain (CARDIA)    Difficulty of Paying Living Expenses: Not hard at all  Food Insecurity: No Food Insecurity (06/24/2021)   Hunger Vital Sign    Worried About Running Out of Food in the Last Year: Never true    Ran Out of Food in the Last Year: Never true  Housing: Low Risk  (06/24/2021)   Housing    Last Housing Risk Score: 0  Physical Activity: Insufficiently Active (06/24/2021)   Exercise Vital Sign    Days of Exercise per Week: 7 days    Minutes of Exercise per Session: 20 min  Social Connections: Moderately Integrated (06/24/2021)   Social Connection and Isolation Panel [NHANES]    Frequency of Communication with Friends and Family: More than three times a week    Frequency of Social  Gatherings with Friends and Family: More than three times a week    Attends Religious Services: More than 4 times per year    Active Member of [employer] or Organizations: Yes    Attends Archivist Meetings: More than 4 times per year    Marital Status: Widowed  Stress: No Stress Concern Present (06/24/2021)   [place]    Feeling of Stress : Not at all  Tobacco Use: Medium Risk (08/19/2021)   Patient History    Smoking Tobacco Use: Former    Smokeless Tobacco Use: Never    Passive Exposure: Not on file  Transportation Needs: No Transportation Needs (06/24/2021)   PRAPARE - Transportation    Lack of Transportation (Medical): No    Lack of Transportation (Non-Medical): No   CCM Care Plan  Allergies  Allergen Reactions   Augmentin [Amoxicillin-Pot Clavulanate] Itching and Other (See Comments)    Severe vaginal itching   Tranxene [Clorazepate] Itching   Crestor [Rosuvastatin]     Made her sick on stomach, she is able to tolerate the zocor   Penicillins Itching and Rash    Has patient had a PCN reaction causing immediate rash, facial/tongue/throat swelling, SOB or lightheadedness with hypotension: Yes Has patient had a PCN reaction causing severe rash involving mucus membranes or skin necrosis: No Has patient had a PCN reaction that required hospitalization: No Has patient had a PCN reaction occurring within the last 10 years: No If all of the above answers are "NO", then may proceed with Cephalosporin use.     Medications Reviewed Today     Reviewed by Farrel Conners, MD (Physician) on 08/19/21 at 1345  Med List Status: <None>   Medication Order Taking? Sig Documenting Provider Last Dose Status Informant  acetaminophen (TYLENOL) 500 MG tablet [id number] Yes Take  500 mg by mouth every 6 (six) hours as needed for mild pain.  [provider] Taking Active Multiple Informants  atorvastatin (LIPITOR) 40  MG tablet [id number] Yes Take 1 tablet by mouth once daily Koberlein, Junell C, MD Taking Active   Blood Glucose Monitoring Suppl (ONETOUCH VERIO) w/Device KIT [id number] Yes 1 each by Does not apply route as directed. [name], MD Taking Active Multiple Informants  dapagliflozin propanediol (FARXIGA) 10 MG TABS tablet [id number] Yes Take 1 tablet (10 mg total) by mouth daily before breakfast. Richardson Dopp T, PA-C Taking Active   diclofenac Sodium (VOLTAREN) 1 % GEL [id number] Yes Apply 4 g topically 4 (four) times daily. Chase Picket, MD Taking Active Multiple Informants  furosemide (LASIX) 20 MG tablet [id number] Yes Take 1 tablet (20 mg total) by mouth daily as needed for fluid or edema. [name], MD Taking Active   glucose blood (ONETOUCH VERIO) test strip [id number] Yes USE 1 STRIP TO CHECK GLUCOSE ONCE DAILY AS DIRECTED Koberlein, Steele Berg, MD Taking Active Multiple Informants  levothyroxine (EUTHYROX) 25 MCG tablet [id number] Yes Take 1 tablet (25 mcg total) by mouth daily before breakfast. [name], MD Taking Active Multiple Informants  metoprolol succinate (TOPROL-XL) 50 MG 24 hr tablet [id number]  Take 1 tablet (50 mg total) by mouth every evening. Richardson Dopp T, PA-C  Expired 05/21/21 8270   OneTouch Delica Lancets 78M MISC [id number] Yes USE 1  TO CHECK GLUCOSE ONCE DAILY Koberlein, Junell C, MD Taking Active Multiple Informants  pantoprazole (PROTONIX) 40 MG tablet [id number] Yes Take 1 tablet (40 mg total) by mouth daily. [name], MD Taking Active   Discontinued 08/19/21 1345 (Cost of medication)   spironolactone (ALDACTONE) 25 MG tablet [id number] Yes Take 0.5 tablets (12.5 mg total) by mouth daily. Richardson Dopp T, PA-C Taking Active   vitamin B-12 (CYANOCOBALAMIN) 1000 MCG tablet [id number] Yes Take 1 tablet (1,000 mcg total) by mouth daily. [name], MD Taking Active Multiple Informants  vitamin C (ASCORBIC ACID) 500 MG tablet  [id number] Yes Take 500 mg by mouth daily.  [provider] Taking Active Multiple Informants  warfarin (COUMADIN) 5 MG tablet [id number] Yes TAKE 1/2 TO 1 (ONE-HALF TO ONE) TABLET BY MOUTH ONCE DAILY AS  DIRECTED  BY  COUMADIN  CLINIC Fay Records, MD Taking Active             Patient Active Problem List   Diagnosis Date Noted   Elevated troponin    Dilated cardiomyopathy ([place])    Ischemic cardiomyopathy    PAF (paroxysmal atrial fibrillation) (HCC)    Non-ST elevation (NSTEMI) myocardial infarction ([place]) 12/25/2020   Thrombocytopenia ([place]) 04/23/2020   CAD (coronary artery disease) 06/24/2019   Hypertension 04/25/2019   Chest pain with elevated troponin  12/15/2018   Chronic kidney disease, stage 3a ([place]) 12/15/2018   Frequent falls 05/21/2018   HFimpEF (heart failure with improved ejection fraction) ([place]) 03/15/2017   Anticoagulated on warfarin    Pulmonary hypertension ([place]) 07/26/2016   Neuropathy 02/12/2015   History of pulmonary embolism 06/10/2013   Hypothyroid 06/06/2013   Diabetes mellitus with neurological manifestations, controlled ([place]) 06/06/2013   Hyperlipidemia 02/25/2013   DM2 (diabetes mellitus, type 2) ([place]) 07/23/2012    Immunization History  Administered Date(s) Administered   Fluad Quad(high Dose 65+) 09/12/2018, 10/30/2019   Influenza Split 10/03/2012   Influenza Whole 10/04/2011   Influenza, High Dose Seasonal PF 11/12/2014, 10/21/2015, 10/20/2016, 09/19/2017   Influenza,inj,Quad  PF,6+ Mos 11/07/2013   Influenza-Unspecified 10/03/2016   PFIZER(Purple Top)SARS-COV-2 Vaccination 02/26/2019, 03/09/2019, 12/20/2019   PNEUMOCOCCAL CONJUGATE-20 02/26/2021   Pneumococcal Conjugate-13 04/07/2014, 10/20/2016   Pneumococcal-Unspecified 10/04/2011   Tdap 04/07/2014   Zoster, Live 07/23/2015   Patient reports she isn't sure about the cost of her medications.  Patient reports she has had a lot of changes with her medications and wasn't sure exactly  why these changes were being made.  Delene Loll?  -put her back on losartan at least?  -still taking [place]? Farxiga 10 mg - last filled 05/29/2021 30 DS at [facility] verified with Inez Catalina - has she been getting regularly with patient assistance? -call: [phone]  Doristine Bosworth from church in [place]. He was only 81 years old. Patient is sad about this but realizes this is a part of life.  Patient has a nurses aid coming out for 3 days a week. She helps with bathing and doing housework. She also makes her meals. Patient has had her coming out for about a year and this is from [facility].  Patient is not currently taking the [facility] and said it would be around $500 for 90 days supply. It would be $90 for a 30 days supply.   BP Readings from Last 3 Encounters:  08/19/21 110/68  06/16/21 (!) 179/74  05/21/21 116/63     Conditions to be addressed/monitored:  Hypertension, Hyperlipidemia, Diabetes, Heart Failure, Chronic Kidney Disease, Hypothyroidism, Osteoarthritis, and GI bleed and neuropathy  Conditions addressed this visit: Hypertension, diabetes, heart failure  Care Plan : [place]  Updates made by [name], [place] since 08/26/2021 12:00 AM     Problem: Problem: Hypertension, Hyperlipidemia, Diabetes, Heart Failure, Chronic Kidney Disease, Hypothyroidism, Osteoarthritis, and GI bleed and neuropathy      Long-Range Goal: Patient-Specific Goal   Start Date: 06/18/2020  Expected End Date: 06/18/2021  Recent Progress: On track  Priority: High  Note:   Current Barriers:  Unable to independently afford treatment regimen Unable to independently monitor therapeutic efficacy  Pharmacist Clinical Goal(s):  Patient will verbalize ability to afford treatment regimen achieve adherence to monitoring guidelines and medication adherence to achieve therapeutic efficacy maintain control of blood pressure as evidenced by home blood pressure readings  through collaboration  with PharmD and provider.   Interventions: 1:1 collaboration with [name], MD regarding development and update of comprehensive plan of care as evidenced by provider attestation and co-signature Inter-disciplinary care team collaboration (see longitudinal plan of care) Comprehensive medication review performed; medication list updated in electronic medical record  Hypertension (BP goal <140/90) -Controlled -Current treatment: Metoprolol succinate 50 mg 1 tablet once daily - Appropriate, Effective, Safe, Accessible Entresto 25-26 mg 1 tablet twice daily -  not taking - Appropriate, Effective, Safe, Query accessible Spironolactone 25 mg 1/2 tablet daily -  Appropriate, Effective, Safe, Accessible -Medications previously tried: amlodipine, lisinopril  -Current home readings: 142/69, 132/75 HR 60, 135/65 HR 50, 137/65 HR 52, 142/69 HR 69,  143/81 HR 60 (checking a couple days a week) -Current dietary habits: limits salt intake; daughters bring her supper every day; eats out sometimes -Current exercise habits: PT exercises every day (doesn't go down the steps every day -Denies hypotensive/hypertensive symptoms -Educated on Daily salt intake goal < 2300 mg; Importance of home blood pressure monitoring; Proper BP monitoring technique; Symptoms of hypotension and importance of maintaining adequate hydration; -Counseled to monitor BP at home daily, document, and provide log at future appointments -Counseled on diet and exercise  extensively Recommended to continue current medication  Hyperlipidemia: (LDL goal < 70) -Controlled -Current treatment: Atorvastatin 40 mg 1 tablet at bedtime - Appropriate, Effective, Safe, Accessible -Medications previously tried: rosuvastatin (side effects), simvastatin (not effective) -Current dietary patterns: eats a lot of fish and only some red meat; tries to boil instead of frying and tries not to cook with oil but does use vegetable oil - recommended  olive or canola -Current exercise habits: PT exercises every day -Educated on Cholesterol goals;  Benefits of statin for ASCVD risk reduction; Importance of limiting foods high in cholesterol; -Counseled on diet and exercise extensively Recommended to continue current medication  Diabetes (A1c goal <7%) -Controlled -Current medications: Farxiga 10 mg 1 tablet daily  - Appropriate, Effective, Safe, Accessible -Medications previously tried: metformin (not needed) -Current home glucose readings fasting glucose: 117, 121, 124 post prandial glucose: does not check -Denies hypoglycemic/hyperglycemic symptoms -Current meal patterns:  breakfast: did not discuss lunch: did not discuss  dinner: did not discuss snacks: did not discuss drinks: did not discuss -Current exercise: PT exercises every day -Educated on A1c and blood sugar goals; Prevention and management of hypoglycemic episodes; Benefits of routine self-monitoring of blood sugar; Carbohydrate counting and/or plate method -Counseled to check feet daily and get yearly eye exams -Counseled on diet and exercise extensively Recommended to continue current medication Recommended for patient to contact eye doctor to get report from last eye exam  Heart Failure (Goal: manage symptoms and prevent exacerbations) -Controlled -Last ejection fraction: 45-50% (Date: 04/23/20) -HF type: Diastolic -NYHA Class: II (slight limitation of activity) -AHA HF Stage: B (Heart disease present - no symptoms present) -Current treatment: Furosemide 20 mg daily as needed - Appropriate, Effective, Safe, Accessible Metoprolol succinate 50 mg 1 tablet daily - Appropriate, Effective, Safe, Accessible Entresto 25-26 mg 1 tablet twice daily - not taking - Appropriate, Effective, Safe, Accessible Farxiga 10 mg 1 tablet daily - Appropriate, Effective, Safe, Accessible Spironolactone 25 mg 1/2 tablet daily - Appropriate, Effective, Safe,  Accessible -Medications previously tried: none  -Current home BP/HR readings: refer to above -Current dietary habits: limiting salt intake -Current exercise habits: PT exercises -Educated on Benefits of medications for managing symptoms and prolonging life Importance of weighing daily; if you gain more than 3 pounds in one day or 5 pounds in one week, call cardiologist Importance of blood pressure control -Counseled on diet and exercise extensively Recommended to continue current medication Patient is only taking furosemide not even once a week  Hypothyroidism (Goal: TSH  0.35-4.5) -Controlled -Current treatment  Euthyrox 25 mcg 1 tablet before breakfast - Appropriate, Effective, Safe, Accessible -Medications previously tried: none  -Recommended to continue current medication Counseled on importance of taking this medication on an empty stomach separate from all multivitamins and supplements.  Stomach protection (Goal: protect stomach after GI bleed) -Controlled -Current treatment  Pantoprazole 40 mg 1 tablet daily - Appropriate, Effective, Safe, Accessible -Medications previously tried: none  - Plan to reassess need at follow up.  Osteoarthritis (Goal: minimize pain) -Controlled -Current treatment  Voltaren gel 1% apply as needed - Appropriate, Effective, Safe, Accessible Aspercreme as needed - Appropriate, Effective, Safe, Accessible Tylenol 500 mg 1 tablet as needed - Appropriate, Effective, Safe, Accessible -Medications previously tried: none  -Recommended to continue current medication  History of PE (Goal: prevent blood clots) -Controlled -Current treatment  Warfarin 5 mg as directed by coumadin clinic - Appropriate, Effective, Safe, Accessible -Medications previously tried: none  -Recommended to continue current medication Counseled on  maintaining consistent intake of greens from week to week   Health Maintenance -Vaccine gaps: shingrix, COVID booster, influenza,  pneumovax -Current therapy:  Vitamin C 500 mg daily Vitamin B12 1000 mcg daily -Educated on Cost vs benefit of each product must be carefully weighed by individual consumer -Patient is satisfied with current therapy and denies issues -Recommended to continue current medication  Patient Goals/Self-Care Activities Patient will:  - take medications as prescribed check glucose daily, document, and provide at future appointments check blood pressure daily, document, and provide at future appointments weigh daily, and contact provider if weight gain of > 3 lbs in one day or > 5 lbs in one week  Follow Up Plan: The care management team will reach out to the patient again over the next 7 days.         Medication Assistance: Application for Wilder Glade and Entresto  medication assistance program. in process.  Anticipated assistance start date 04/24/21.  See plan of care for additional detail.  Compliance/Adherence/Medication fill history: Care Gaps: Shingrix, eye exam, influenza vaccine, COVID booster, microalbumin Last BP - 110/68 on 08/19/2021 Last A1C - 6.7 on 08/19/2021  Star-Rating Drugs: Atorvastatin 40 mg - last filled 05/18/2021 90 DS at [facility] verified with Judeen Hammans 10 mg - last filled 05/29/2021 30 DS at [facility] verified with Inez Catalina  Patient's preferred pharmacy is:  [place] ([place]), [place] - [address] [place] [address] Phone: [phone] Fax: [phone]   Uses pill box? Yes Pt endorses 99% compliance  We discussed: Benefits of medication synchronization, packaging and delivery as well as enhanced pharmacist oversight with [employer]. Patient decided to: Continue current medication management strategy  Care Plan and Follow Up Patient Decision:  Patient agrees to Care Plan and Follow-up.  Plan: Face to Face appointment with care management team member scheduled for: 1 week  Jeni Salles, PharmD,  [place] Pharmacist [place] at [place] [phone]

## 2021-08-26 NOTE — Patient Instructions (Signed)
Hi Tilly,  It was great to catch up with you again!  Please reach out to me if you have any questions or need anything before we meet in person next week! Don't forget to bring your proof of income for that visit.  Best, Maddie  Gaylord Shih, PharmD, [facility] Clinical Pharmacist [facility] [place] [phone]   Visit Information   Goals Addressed   None    Patient Care Plan: CCM Pharmacy Care Plan     Problem Identified: Problem: Hypertension, Hyperlipidemia, Diabetes, Heart Failure, Chronic Kidney Disease, Hypothyroidism, Osteoarthritis, and GI bleed and neuropathy      Long-Range Goal: Patient-Specific Goal   Start Date: 06/18/2020  Expected End Date: 06/18/2021  Recent Progress: On track  Priority: High  Note:   Current Barriers:  Unable to independently afford treatment regimen Unable to independently monitor therapeutic efficacy  Pharmacist Clinical Goal(s):  Patient will verbalize ability to afford treatment regimen achieve adherence to monitoring guidelines and medication adherence to achieve therapeutic efficacy maintain control of blood pressure as evidenced by home blood pressure readings  through collaboration with PharmD and provider.   Interventions: 1:1 collaboration with Wynn Banker, MD regarding development and update of comprehensive plan of care as evidenced by provider attestation and co-signature Inter-disciplinary care team collaboration (see longitudinal plan of care) Comprehensive medication review performed; medication list updated in electronic medical record  Hypertension (BP goal <140/90) -Controlled -Current treatment: Metoprolol succinate 50 mg 1 tablet once daily - Appropriate, Effective, Safe, Accessible Entresto 25-26 mg 1 tablet twice daily -  not taking - Appropriate, Effective, Safe, Query accessible Spironolactone 25 mg 1/2 tablet daily -  Appropriate, Effective, Safe, Accessible -Medications previously tried:  amlodipine, lisinopril  -Current home readings: 142/69, 132/75 HR 60, 135/65 HR 50, 137/65 HR 52, 142/69 HR 69,  143/81 HR 60 (checking a couple days a week) -Current dietary habits: limits salt intake; daughters bring her supper every day; eats out sometimes -Current exercise habits: PT exercises every day (doesn't go down the steps every day -Denies hypotensive/hypertensive symptoms -Educated on Daily salt intake goal < 2300 mg; Importance of home blood pressure monitoring; Proper BP monitoring technique; Symptoms of hypotension and importance of maintaining adequate hydration; -Counseled to monitor BP at home daily, document, and provide log at future appointments -Counseled on diet and exercise extensively Recommended to continue current medication  Hyperlipidemia: (LDL goal < 70) -Controlled -Current treatment: Atorvastatin 40 mg 1 tablet at bedtime - Appropriate, Effective, Safe, Accessible -Medications previously tried: rosuvastatin (side effects), simvastatin (not effective) -Current dietary patterns: eats a lot of fish and only some red meat; tries to boil instead of frying and tries not to cook with oil but does use vegetable oil - recommended olive or canola -Current exercise habits: PT exercises every day -Educated on Cholesterol goals;  Benefits of statin for ASCVD risk reduction; Importance of limiting foods high in cholesterol; -Counseled on diet and exercise extensively Recommended to continue current medication  Diabetes (A1c goal <7%) -Controlled -Current medications: Farxiga 10 mg 1 tablet daily  - Appropriate, Effective, Safe, Accessible -Medications previously tried: metformin (not needed) -Current home glucose readings fasting glucose: 117, 121, 124 post prandial glucose: does not check -Denies hypoglycemic/hyperglycemic symptoms -Current meal patterns:  breakfast: did not discuss lunch: did not discuss  dinner: did not discuss snacks: did not  discuss drinks: did not discuss -Current exercise: PT exercises every day -Educated on A1c and blood sugar goals; Prevention and management of hypoglycemic episodes; Benefits of  routine self-monitoring of blood sugar; Carbohydrate counting and/or plate method -Counseled to check feet daily and get yearly eye exams -Counseled on diet and exercise extensively Recommended to continue current medication Recommended for patient to contact eye doctor to get report from last eye exam  Heart Failure (Goal: manage symptoms and prevent exacerbations) -Controlled -Last ejection fraction: 45-50% (Date: 04/23/20) -HF type: Diastolic -NYHA Class: II (slight limitation of activity) -AHA HF Stage: B (Heart disease present - no symptoms present) -Current treatment: Furosemide 20 mg daily as needed - Appropriate, Effective, Safe, Accessible Metoprolol succinate 50 mg 1 tablet daily - Appropriate, Effective, Safe, Accessible Entresto 25-26 mg 1 tablet twice daily - not taking - Appropriate, Effective, Safe, Accessible Farxiga 10 mg 1 tablet daily - Appropriate, Effective, Safe, Accessible Spironolactone 25 mg 1/2 tablet daily - Appropriate, Effective, Safe, Accessible -Medications previously tried: none  -Current home BP/HR readings: refer to above -Current dietary habits: limiting salt intake -Current exercise habits: PT exercises -Educated on Benefits of medications for managing symptoms and prolonging life Importance of weighing daily; if you gain more than 3 pounds in one day or 5 pounds in one week, call cardiologist Importance of blood pressure control -Counseled on diet and exercise extensively Recommended to continue current medication Patient is only taking furosemide not even once a week  Hypothyroidism (Goal: TSH  0.35-4.5) -Controlled -Current treatment  Euthyrox 25 mcg 1 tablet before breakfast - Appropriate, Effective, Safe, Accessible -Medications previously tried: none   -Recommended to continue current medication Counseled on importance of taking this medication on an empty stomach separate from all multivitamins and supplements.  Stomach protection (Goal: protect stomach after GI bleed) -Controlled -Current treatment  Pantoprazole 40 mg 1 tablet daily - Appropriate, Effective, Safe, Accessible -Medications previously tried: none  - Plan to reassess need at follow up.  Osteoarthritis (Goal: minimize pain) -Controlled -Current treatment  Voltaren gel 1% apply as needed - Appropriate, Effective, Safe, Accessible Aspercreme as needed - Appropriate, Effective, Safe, Accessible Tylenol 500 mg 1 tablet as needed - Appropriate, Effective, Safe, Accessible -Medications previously tried: none  -Recommended to continue current medication  History of PE (Goal: prevent blood clots) -Controlled -Current treatment  Warfarin 5 mg as directed by coumadin clinic - Appropriate, Effective, Safe, Accessible -Medications previously tried: none  -Recommended to continue current medication Counseled on maintaining consistent intake of greens from week to week   Health Maintenance -Vaccine gaps: shingrix, COVID booster, influenza, pneumovax -Current therapy:  Vitamin C 500 mg daily Vitamin B12 1000 mcg daily -Educated on Cost vs benefit of each product must be carefully weighed by individual consumer -Patient is satisfied with current therapy and denies issues -Recommended to continue current medication  Patient Goals/Self-Care Activities Patient will:  - take medications as prescribed check glucose daily, document, and provide at future appointments check blood pressure daily, document, and provide at future appointments weigh daily, and contact provider if weight gain of > 3 lbs in one day or > 5 lbs in one week  Follow Up Plan: The care management team will reach out to the patient again over the next 7 days.         Patient verbalizes understanding of  instructions and care plan provided today and agrees to view in MyChart. Active MyChart status and patient understanding of how to access instructions and care plan via MyChart confirmed with patient.    Face to Face appointment with pharmacist scheduled for:   Verner Chol, [facility]

## 2021-09-01 ENCOUNTER — Ambulatory Visit: Payer: Medicare Other | Admitting: Pharmacist

## 2021-09-01 DIAGNOSIS — I10 Essential (primary) hypertension: Secondary | ICD-10-CM

## 2021-09-01 DIAGNOSIS — I5020 Unspecified systolic (congestive) heart failure: Secondary | ICD-10-CM

## 2021-09-01 NOTE — Progress Notes (Signed)
Chronic Care Management Pharmacy Note  09/01/2021 Name:  Charitie Hinote MRN:  [id number] DOB:  [birth year]  Summary: BP not ideally at goal < 140/90 per home readings Pt stopped Entresto due to cost  Recommendations/Changes made from today's visit: -Recommended restarting Entresto filled out PAP form in office -Recommended regular continued BP monitoring at home  Plan: Follow up BP assessment in 2 months Follow up on Entresto PAP status   Subjective: Syrena Burges is an 81 y.o. year old female who is a primary patient of Legrand Como, Royston Cowper, MD.  The CCM team was consulted for assistance with disease management and care coordination needs.    Engaged with patient by telephone for follow up visit in response to provider referral for pharmacy case management and/or care coordination services.   Consent to Services:  The patient was given information about Chronic Care Management services, agreed to services, and gave verbal consent prior to initiation of services.  Please see initial visit note for detailed documentation.   Patient Care Team: Farrel Conners, MD as PCP - General (Family Medicine) Fay Records, MD as PCP - Cardiology (Cardiology) [name], [facility] as Pharmacist (Pharmacist) Sharmon Revere as Physician Assistant (Cardiology)  Recent office visits: 08/19/21 Loralyn Freshwater, MD: Patient presented to establish care. No medication changes made. Removed Entresto from medication list as this was too expensive. Follow up in 6 months.  06/24/21 Rolene Arbour, LPN: Patient presented for AWV.  05/21/2021 Micheline Rough MD - Patient was seen for Primary hypertension and additional issues. Referral to Podiatry. No medication changes. Follow up in 3 months.   04/21/2021 Micheline Rough MD - Patient was seen for Type 2 diabetes mellitus with diabetic polyneuropathy, without long-term current use of insulin and additional issues. No medication changes. Follow up in 4  months.   02/26/21 Micheline Rough MD: Patient presented for HFrEF and chronic conditions follow up. D/c'd metformin due to use of [place]. Administered Prevnar20.  Recent consult visits: 08/18/21 Ovidio Kin, RN (cardiology): Patient presented for anticoag visit. INR 2.5, goal 2.5-3.5. Continued 5 mg (5 mg x 1) every Mon, Fri; 2.5 mg (5 mg x 0.5) all other days.  06/10/2021 Celesta Gentile DPM(podiatry) - Patient was seen for Dermatophytosis of nail and additional issues. No medication changes. Follow up in 3 months.    05/21/2021 Dorris Carnes MD (cardiology) - Patient was seen for hypertension unspecified type. No medication changes. Follow up in 6 months.   03/23/21 Telephone encounter (cardiology): Prescribed spironolactone 12.5 mg once daily. Plan for BMET in 1 week and 2 weeks. Schedule ECHO for 6-8 weeks.  03/16/21 Candance Hemphill, RN (cardiology): Patient presented for anticoag visit. INR 3.5, goal 2.5-3.5. Continued 5 mg (5 mg x 1) every Mon, Fri; 2.5 mg (5 mg x 0.5) all other days.  Hospital visits: Patient was seen at [facility] ED on 06/16/2021 (7 hours) due to hypertension.    New?Medications Started at [facility] Discharge:?? No medications started Medication Changes at Hospital Discharge: No medications changed Medications Discontinued at Hospital Discharge: No medications discontinued Medications that remain the same after Hospital Discharge:??  -All other medications will remain the same.     Objective:  Lab Results  Component Value Date   CREATININE 1.49 (H) 06/16/2021   BUN 28 (H) 06/16/2021   GFR 37.65 (L) 02/15/2021   GFRNONAA 35 (L) 06/16/2021   GFRAA 45 (L) 12/03/2019   NA 140 06/16/2021   K 4.1 06/16/2021   CALCIUM  8.8 (L) 06/16/2021   CO2 19 (L) 06/16/2021   GLUCOSE 86 06/16/2021    Lab Results  Component Value Date/Time   HGBA1C 6.7 (A) 08/19/2021 01:18 PM   HGBA1C 6.3 02/15/2021 01:01 PM   HGBA1C 6.7 (H) 08/19/2020 03:16 PM   GFR  37.65 (L) 02/15/2021 01:01 PM   GFR 53.04 (L) 08/19/2020 03:16 PM   MICROALBUR 4.3 (H) 09/12/2018 03:07 PM   MICROALBUR 6.2 (H) 04/10/2015 09:01 AM    Last diabetic Eye exam:  Lab Results  Component Value Date/Time   HMDIABEYEEXA No Retinopathy 06/23/2015 12:00 AM    Last diabetic Foot exam: No results found for: "HMDIABFOOTEX"   Lab Results  Component Value Date   CHOL 150 02/15/2021   HDL 67.50 02/15/2021   LDLCALC 66 02/15/2021   TRIG 80.0 02/15/2021   CHOLHDL 2 02/15/2021       Latest Ref Rng & Units 02/15/2021    1:01 PM 01/08/2021    8:15 AM 12/28/2020    1:45 AM  Hepatic Function  Total Protein 6.0 - 8.3 g/dL 7.8  5.8  5.8   Albumin 3.5 - 5.2 g/dL 4.3  2.8  2.6   AST 0 - 37 U/L _0 ALT 0 - 35 U/L _1 Alk Phosphatase 39 - 117 U/L 57  42  42   Total Bilirubin 0.2 - 1.2 mg/dL 0.6  1.0  0.4     Lab Results  Component Value Date/Time   TSH 2.00 08/19/2021 02:02 PM   TSH 2.93 08/19/2020 03:16 PM   FREET4 1.09 02/25/2013 12:23 PM       Latest Ref Rng & Units 06/16/2021    1:15 PM 02/15/2021    1:01 PM 01/08/2021    8:21 AM  CBC  WBC 4.0 - 10.5 K/uL 9.4  9.2    Hemoglobin 12.0 - 15.0 g/dL 12.4  13.3  13.6   Hematocrit 36.0 - 46.0 % 41.8  43.1  40.0   Platelets 150 - 400 K/uL 305  241.0      No results found for: "VD25OH"  Clinical ASCVD: No  The ASCVD Risk score (Arnett DK, et al., 2019) failed to calculate for the following reasons:   The 2019 ASCVD risk score is only valid for ages 69 to 61   The patient has a prior MI or stroke diagnosis       06/24/2021    3:26 PM 01/13/2021   11:33 AM 03/10/2020   11:22 AM  Depression screen PHQ 2/9  Decreased Interest 0 0 0  Down, Depressed, Hopeless 0 0 0  PHQ - 2 Score 0 0 0  Altered sleeping  0   Tired, decreased energy  0   Change in appetite  0   Feeling bad or failure about yourself   3   Suicidal thoughts  0   PHQ-9 Score  3     Social History   Tobacco Use  Smoking Status Former    Packs/day: 1.00   Years: 10.00   Total pack years: 10.00   Types: Cigarettes   Quit date: 01/04/1968   Years since quitting: 53.6  Smokeless Tobacco Never   BP Readings from Last 3 Encounters:  08/19/21 110/68  06/16/21 (!) 179/74  05/21/21 116/63   Pulse Readings from Last 3 Encounters:  08/19/21 72  06/16/21 77  05/21/21 67   Wt Readings from Last 3 Encounters:  08/19/21 195 lb  4.8 oz (88.6 kg)  06/24/21 188 lb (85.3 kg)  06/16/21 177 lb (80.3 kg)   BMI Readings from Last 3 Encounters:  08/19/21 30.59 kg/m  06/24/21 29.44 kg/m  06/16/21 27.72 kg/m    Assessment/Interventions: Review of patient past medical history, allergies, medications, health status, including review of consultants reports, laboratory and other test data, was performed as part of comprehensive evaluation and provision of chronic care management services.   SDOH:  (Social Determinants of Health) assessments and interventions performed: No     SDOH Screenings   Alcohol Screen: Low Risk  (06/24/2021)   Alcohol Screen    Last Alcohol Screening Score (AUDIT): 0  Depression (PHQ2-9): Low Risk  (06/24/2021)   Depression (PHQ2-9)    PHQ-2 Score: 0  Financial Resource Strain: Low Risk  (06/24/2021)   Overall Financial Resource Strain (CARDIA)    Difficulty of Paying Living Expenses: Not hard at all  Food Insecurity: No Food Insecurity (06/24/2021)   Hunger Vital Sign    Worried About Running Out of Food in the Last Year: Never true    Ran Out of Food in the Last Year: Never true  Housing: Low Risk  (06/24/2021)   Housing    Last Housing Risk Score: 0  Physical Activity: Insufficiently Active (06/24/2021)   Exercise Vital Sign    Days of Exercise per Week: 7 days    Minutes of Exercise per Session: 20 min  Social Connections: Moderately Integrated (06/24/2021)   Social Connection and Isolation Panel [NHANES]    Frequency of Communication with Friends and Family: More than three times a week     Frequency of Social Gatherings with Friends and Family: More than three times a week    Attends Religious Services: More than 4 times per year    Active Member of [employer] or Organizations: Yes    Attends Archivist Meetings: More than 4 times per year    Marital Status: Widowed  Stress: No Stress Concern Present (06/24/2021)   [place]    Feeling of Stress : Not at all  Tobacco Use: Medium Risk (08/19/2021)   Patient History    Smoking Tobacco Use: Former    Smokeless Tobacco Use: Never    Passive Exposure: Not on file  Transportation Needs: No Transportation Needs (06/24/2021)   PRAPARE - Transportation    Lack of Transportation (Medical): No    Lack of Transportation (Non-Medical): No   CCM Care Plan  Allergies  Allergen Reactions   Augmentin [Amoxicillin-Pot Clavulanate] Itching and Other (See Comments)    Severe vaginal itching   Tranxene [Clorazepate] Itching   Crestor [Rosuvastatin]     Made her sick on stomach, she is able to tolerate the zocor   Penicillins Itching and Rash    Has patient had a PCN reaction causing immediate rash, facial/tongue/throat swelling, SOB or lightheadedness with hypotension: Yes Has patient had a PCN reaction causing severe rash involving mucus membranes or skin necrosis: No Has patient had a PCN reaction that required hospitalization: No Has patient had a PCN reaction occurring within the last 10 years: No If all of the above answers are "NO", then may proceed with Cephalosporin use.     Medications Reviewed Today     Reviewed by Farrel Conners, MD (Physician) on 08/19/21 at 1345  Med List Status: <None>   Medication Order Taking? Sig Documenting Provider Last Dose Status Informant  acetaminophen (TYLENOL) 500 MG tablet [id number]  Yes Take 500 mg by mouth every 6 (six) hours as needed for mild pain.  [provider] Taking Active Multiple Informants   atorvastatin (LIPITOR) 40 MG tablet [id number] Yes Take 1 tablet by mouth once daily Koberlein, Junell C, MD Taking Active   Blood Glucose Monitoring Suppl (ONETOUCH VERIO) w/Device KIT [id number] Yes 1 each by Does not apply route as directed. [name], MD Taking Active Multiple Informants  dapagliflozin propanediol (FARXIGA) 10 MG TABS tablet [id number] Yes Take 1 tablet (10 mg total) by mouth daily before breakfast. Richardson Dopp T, PA-C Taking Active   diclofenac Sodium (VOLTAREN) 1 % GEL [id number] Yes Apply 4 g topically 4 (four) times daily. Chase Picket, MD Taking Active Multiple Informants  furosemide (LASIX) 20 MG tablet [id number] Yes Take 1 tablet (20 mg total) by mouth daily as needed for fluid or edema. [name], MD Taking Active   glucose blood (ONETOUCH VERIO) test strip [id number] Yes USE 1 STRIP TO CHECK GLUCOSE ONCE DAILY AS DIRECTED Koberlein, Steele Berg, MD Taking Active Multiple Informants  levothyroxine (EUTHYROX) 25 MCG tablet [id number] Yes Take 1 tablet (25 mcg total) by mouth daily before breakfast. [name], MD Taking Active Multiple Informants  metoprolol succinate (TOPROL-XL) 50 MG 24 hr tablet [id number]  Take 1 tablet (50 mg total) by mouth every evening. Richardson Dopp T, PA-C  Expired 05/21/21 0737   OneTouch Delica Lancets 10G MISC [id number] Yes USE 1  TO CHECK GLUCOSE ONCE DAILY Koberlein, Junell C, MD Taking Active Multiple Informants  pantoprazole (PROTONIX) 40 MG tablet [id number] Yes Take 1 tablet (40 mg total) by mouth daily. [name], MD Taking Active   Discontinued 08/19/21 1345 (Cost of medication)   spironolactone (ALDACTONE) 25 MG tablet [id number] Yes Take 0.5 tablets (12.5 mg total) by mouth daily. Richardson Dopp T, PA-C Taking Active   vitamin B-12 (CYANOCOBALAMIN) 1000 MCG tablet [id number] Yes Take 1 tablet (1,000 mcg total) by mouth daily. [name], MD Taking Active Multiple Informants  vitamin C (ASCORBIC  ACID) 500 MG tablet [id number] Yes Take 500 mg by mouth daily.  [provider] Taking Active Multiple Informants  warfarin (COUMADIN) 5 MG tablet [id number] Yes TAKE 1/2 TO 1 (ONE-HALF TO ONE) TABLET BY MOUTH ONCE DAILY AS  DIRECTED  BY  COUMADIN  CLINIC Fay Records, MD Taking Active             Patient Active Problem List   Diagnosis Date Noted   Elevated troponin    Dilated cardiomyopathy ([place])    Ischemic cardiomyopathy    PAF (paroxysmal atrial fibrillation) (HCC)    Non-ST elevation (NSTEMI) myocardial infarction ([place]) 12/25/2020   Thrombocytopenia ([place]) 04/23/2020   CAD (coronary artery disease) 06/24/2019   Hypertension 04/25/2019   Chest pain with elevated troponin  12/15/2018   Chronic kidney disease, stage 3a ([place]) 12/15/2018   Frequent falls 05/21/2018   HFimpEF (heart failure with improved ejection fraction) ([place]) 03/15/2017   Anticoagulated on warfarin    Pulmonary hypertension ([place]) 07/26/2016   Neuropathy 02/12/2015   History of pulmonary embolism 06/10/2013   Hypothyroid 06/06/2013   Diabetes mellitus with neurological manifestations, controlled ([place]) 06/06/2013   Hyperlipidemia 02/25/2013   DM2 (diabetes mellitus, type 2) ([place]) 07/23/2012    Immunization History  Administered Date(s) Administered   Fluad Quad(high Dose 65+) 09/12/2018, 10/30/2019   Influenza Split 10/03/2012   Influenza Whole 10/04/2011   Influenza, High Dose Seasonal PF 11/12/2014, 10/21/2015, 10/20/2016, 09/19/2017  Influenza,inj,Quad PF,6+ Mos 11/07/2013   Influenza-Unspecified 10/03/2016   PFIZER(Purple Top)SARS-COV-2 Vaccination 02/26/2019, 03/09/2019, 12/20/2019   PNEUMOCOCCAL CONJUGATE-20 02/26/2021   Pneumococcal Conjugate-13 04/07/2014, 10/20/2016   Pneumococcal-Unspecified 10/04/2011   Tdap 04/07/2014   Zoster, Live 07/23/2015    Conditions to be addressed/monitored:  Hypertension, Hyperlipidemia, Diabetes, Heart Failure, Chronic Kidney Disease, Hypothyroidism,  Osteoarthritis, and GI bleed and neuropathy  Conditions addressed this visit: Hypertension, diabetes, heart failure  Care Plan : [place]  Updates made by [name], [place] since 09/01/2021 12:00 AM     Problem: Problem: Hypertension, Hyperlipidemia, Diabetes, Heart Failure, Chronic Kidney Disease, Hypothyroidism, Osteoarthritis, and GI bleed and neuropathy      Long-Range Goal: Patient-Specific Goal   Start Date: 06/18/2020  Expected End Date: 06/18/2021  Recent Progress: On track  Priority: High  Note:   Current Barriers:  Unable to independently afford treatment regimen Unable to independently monitor therapeutic efficacy  Pharmacist Clinical Goal(s):  Patient will verbalize ability to afford treatment regimen achieve adherence to monitoring guidelines and medication adherence to achieve therapeutic efficacy maintain control of blood pressure as evidenced by home blood pressure readings  through collaboration with PharmD and provider.   Interventions: 1:1 collaboration with [name], MD regarding development and update of comprehensive plan of care as evidenced by provider attestation and co-signature Inter-disciplinary care team collaboration (see longitudinal plan of care) Comprehensive medication review performed; medication list updated in electronic medical record  Hypertension (BP goal <140/90) -Controlled -Current treatment: Metoprolol succinate 50 mg 1 tablet once daily - Appropriate, Effective, Safe, Accessible Entresto 25-26 mg 1 tablet twice daily -  not taking - Appropriate, Effective, Safe, Query accessible Spironolactone 25 mg 1/2 tablet daily -  Appropriate, Effective, Safe, Accessible -Medications previously tried: amlodipine, lisinopril  -Current home readings: 142/69, 132/75 HR 60, 135/65 HR 50, 137/65 HR 52, 142/69 HR 69,  143/81 HR 60 (checking a couple days a week) -Current dietary habits: limits salt intake; daughters bring  her supper every day; eats out sometimes -Current exercise habits: PT exercises every day (doesn't go down the steps every day -Denies hypotensive/hypertensive symptoms -Educated on Daily salt intake goal < 2300 mg; Importance of home blood pressure monitoring; Proper BP monitoring technique; Symptoms of hypotension and importance of maintaining adequate hydration; -Counseled to monitor BP at home daily, document, and provide log at future appointments -Counseled on diet and exercise extensively Recommended to continue current medication  Hyperlipidemia: (LDL goal < 70) -Controlled -Current treatment: Atorvastatin 40 mg 1 tablet at bedtime - Appropriate, Effective, Safe, Accessible -Medications previously tried: rosuvastatin (side effects), simvastatin (not effective) -Current dietary patterns: eats a lot of fish and only some red meat; tries to boil instead of frying and tries not to cook with oil but does use vegetable oil - recommended olive or canola -Current exercise habits: PT exercises every day -Educated on Cholesterol goals;  Benefits of statin for ASCVD risk reduction; Importance of limiting foods high in cholesterol; -Counseled on diet and exercise extensively Recommended to continue current medication  Diabetes (A1c goal <7%) -Controlled -Current medications: Farxiga 10 mg 1 tablet daily  - Appropriate, Effective, Safe, Accessible -Medications previously tried: metformin (not needed) -Current home glucose readings fasting glucose: 117, 121, 124 post prandial glucose: does not check -Denies hypoglycemic/hyperglycemic symptoms -Current meal patterns:  breakfast: did not discuss lunch: did not discuss  dinner: did not discuss snacks: did not discuss drinks: did not discuss -Current exercise: PT exercises every day -Educated on A1c and  blood sugar goals; Prevention and management of hypoglycemic episodes; Benefits of routine self-monitoring of blood  sugar; Carbohydrate counting and/or plate method -Counseled to check feet daily and get yearly eye exams -Counseled on diet and exercise extensively Recommended to continue current medication Recommended for patient to contact eye doctor to get report from last eye exam  Heart Failure (Goal: manage symptoms and prevent exacerbations) -Controlled -Last ejection fraction: 45-50% (Date: 04/23/20) -HF type: Diastolic -NYHA Class: II (slight limitation of activity) -AHA HF Stage: B (Heart disease present - no symptoms present) -Current treatment: Furosemide 20 mg daily as needed - Appropriate, Effective, Safe, Accessible Metoprolol succinate 50 mg 1 tablet daily - Appropriate, Effective, Safe, Accessible Entresto 25-26 mg 1 tablet twice daily - not taking - Appropriate, Effective, Safe, Accessible Farxiga 10 mg 1 tablet daily - Appropriate, Effective, Safe, Accessible Spironolactone 25 mg 1/2 tablet daily - Appropriate, Effective, Safe, Accessible -Medications previously tried: none  -Current home BP/HR readings: refer to above -Current dietary habits: limiting salt intake -Current exercise habits: PT exercises -Educated on Benefits of medications for managing symptoms and prolonging life Importance of weighing daily; if you gain more than 3 pounds in one day or 5 pounds in one week, call cardiologist Importance of blood pressure control -Counseled on diet and exercise extensively Recommended to continue current medication Assessed patient finances. Will apply for PAP for Entesto.  Hypothyroidism (Goal: TSH  0.35-4.5) -Controlled -Current treatment  Euthyrox 25 mcg 1 tablet before breakfast - Appropriate, Effective, Safe, Accessible -Medications previously tried: none  -Recommended to continue current medication Counseled on importance of taking this medication on an empty stomach separate from all multivitamins and supplements.  Stomach protection (Goal: protect stomach after GI  bleed) -Controlled -Current treatment  Pantoprazole 40 mg 1 tablet daily - Appropriate, Effective, Safe, Accessible -Medications previously tried: none  - Plan to reassess need at follow up.  Osteoarthritis (Goal: minimize pain) -Controlled -Current treatment  Voltaren gel 1% apply as needed - Appropriate, Effective, Safe, Accessible Aspercreme as needed - Appropriate, Effective, Safe, Accessible Tylenol 500 mg 1 tablet as needed - Appropriate, Effective, Safe, Accessible -Medications previously tried: none  -Recommended to continue current medication  History of PE (Goal: prevent blood clots) -Controlled -Current treatment  Warfarin 5 mg as directed by coumadin clinic - Appropriate, Effective, Safe, Accessible -Medications previously tried: none  -Recommended to continue current medication Counseled on maintaining consistent intake of greens from week to week   Health Maintenance -Vaccine gaps: shingrix, COVID booster, influenza, pneumovax -Current therapy:  Vitamin C 500 mg daily Vitamin B12 1000 mcg daily -Educated on Cost vs benefit of each product must be carefully weighed by individual consumer -Patient is satisfied with current therapy and denies issues -Recommended to continue current medication  Patient Goals/Self-Care Activities Patient will:  - take medications as prescribed check glucose daily, document, and provide at future appointments check blood pressure daily, document, and provide at future appointments weigh daily, and contact provider if weight gain of > 3 lbs in one day or > 5 lbs in one week  Follow Up Plan: The care management team will reach out to the patient again over the next 30 days.          Medication Assistance: Application for [facility] [place] and Entresto  medication assistance program. in process.  Anticipated assistance start date 04/24/21.  See plan of care for additional detail.  Compliance/Adherence/Medication fill history: Care  Gaps: Shingrix, eye exam, influenza vaccine, COVID booster, microalbumin Last BP -  110/68 on 08/19/2021 Last A1C - 6.7 on 08/19/2021  Star-Rating Drugs: Atorvastatin 40 mg - last filled 05/18/2021 90 DS at [facility] verified with Judeen Hammans 10 mg - last filled 05/29/2021 30 DS at [facility] verified with Inez Catalina  Patient's preferred pharmacy is:  [facility] ([place]), [place] - [address] [place] ([place]) [place] [address] Phone: [phone] Fax: [phone]   Uses pill box? Yes Pt endorses 99% compliance  We discussed: Benefits of medication synchronization, packaging and delivery as well as enhanced pharmacist oversight with [employer]. Patient decided to: Continue current medication management strategy  Care Plan and Follow Up Patient Decision:  Patient agrees to Care Plan and Follow-up.  Plan: Telephone follow up appointment with care management team member scheduled for:  37 days  Jeni Salles, PharmD, [place] Pharmacist [place] [facility] [place] [phone]

## 2021-09-08 ENCOUNTER — Other Ambulatory Visit: Payer: Self-pay | Admitting: Family Medicine

## 2021-09-08 ENCOUNTER — Telehealth: Payer: Self-pay | Admitting: *Deleted

## 2021-09-08 DIAGNOSIS — I5020 Unspecified systolic (congestive) heart failure: Secondary | ICD-10-CM

## 2021-09-08 DIAGNOSIS — I420 Dilated cardiomyopathy: Secondary | ICD-10-CM

## 2021-09-08 DIAGNOSIS — I10 Essential (primary) hypertension: Secondary | ICD-10-CM

## 2021-09-08 DIAGNOSIS — E1142 Type 2 diabetes mellitus with diabetic polyneuropathy: Secondary | ICD-10-CM

## 2021-09-08 MED ORDER — ENTRESTO 24-26 MG PO TABS: 1.0000 | ORAL_TABLET | Freq: Two times a day (BID) | ORAL | 3 refills | Status: DC

## 2021-09-08 NOTE — Telephone Encounter (Signed)
Referral placed as below.  

## 2021-09-08 NOTE — Telephone Encounter (Signed)
-----   Message from [name], MD sent at 09/08/2021 11:36 AM EDT ----- Regarding: RE: CCM pharmacy referral Yes ok to switch to my name! ----- Message ----- From: Johnella Moloney, CMA Sent: 08/31/2021  11:33 AM EDT To: [name], MD Subject: FW: CCM pharmacy referral                      Can you review and give approval? Ronnald Collum   ----- Message ----- From: Verner Chol, [facility] Sent: 08/31/2021  11:21 AM EDT To: Lisa Roca Subject: CCM pharmacy referral                          Hi,  I spoke with Dr. [name] at the end of last week about having CCM referrals placed for all of Dr. Dorita Fray patients as they need to have Dr. Kelton Pillar name on them. She said to pass this along to teamcare as she is not required to put it in but as long as someone on the team does. I will not request them until she has already established care for the patient. That being said, can you please place a CCM pharmacy referral for Ms. Kostelecky? The code is [id number]. If you need me to walk you through how to do it, I would be happy to. Fleet Contras is also a great resource if you need assistance as well!  Thanks, Maddie

## 2021-09-10 ENCOUNTER — Encounter: Payer: Self-pay | Admitting: Podiatry

## 2021-09-10 ENCOUNTER — Ambulatory Visit (INDEPENDENT_AMBULATORY_CARE_PROVIDER_SITE_OTHER): Payer: Medicare Other | Admitting: Podiatry

## 2021-09-10 DIAGNOSIS — E1149 Type 2 diabetes mellitus with other diabetic neurological complication: Secondary | ICD-10-CM | POA: Diagnosis not present

## 2021-09-10 DIAGNOSIS — B351 Tinea unguium: Secondary | ICD-10-CM | POA: Diagnosis not present

## 2021-09-10 DIAGNOSIS — I739 Peripheral vascular disease, unspecified: Secondary | ICD-10-CM

## 2021-09-10 NOTE — Progress Notes (Signed)
This patient returns to my office for at risk foot care.  This patient requires this care by a professional since this patient will be at risk due to having diabetic neuropathy and coagulation defect.  Patient is taking coumadin.  This patient is unable to cut nails herself since the patient cannot reach her nails.These nails are painful walking and wearing shoes.  This patient presents for at risk foot care today.  General Appearance  Alert, conversant and in no acute stress.  Vascular  Dorsalis pedis and posterior tibial  pulses are palpable  bilaterally.  Capillary return is within normal limits  bilaterally. Temperature is within normal limits  bilaterally.  Neurologic  Senn-Weinstein monofilament wire test within normal limits  bilaterally. Muscle power within normal limits bilaterally.  Nails Thick disfigured discolored nails with subungual debris  from hallux to fifth toes bilaterally. No evidence of bacterial infection or drainage bilaterally.  Orthopedic  No limitations of motion  feet .  No crepitus or effusions noted.  No bony pathology or digital deformities noted.  Skin  normotropic skin with no porokeratosis noted bilaterally.  No signs of infections or ulcers noted.     Onychomycosis  Pain in right toes  Pain in left toes  Consent was obtained for treatment procedures.   Mechanical debridement of nails 1-5  bilaterally performed with a nail nipper.  Filed with dremel without incident.    Return office visit   3 months                   Told patient to return for periodic foot care and evaluation due to potential at risk complications.   Helane Gunther DPM

## 2021-09-13 ENCOUNTER — Encounter: Payer: Self-pay | Admitting: *Deleted

## 2021-09-13 ENCOUNTER — Telehealth: Payer: Self-pay | Admitting: Family Medicine

## 2021-09-13 ENCOUNTER — Telehealth: Payer: Self-pay | Admitting: *Deleted

## 2021-09-13 DIAGNOSIS — E079 Disorder of thyroid, unspecified: Secondary | ICD-10-CM

## 2021-09-13 NOTE — Telephone Encounter (Signed)
Pt was prescribed medication on her last visit and is not sure if she should still take these meds. Says she got a call from Ronnald Collum stating all her labs were ok so she is confused.

## 2021-09-13 NOTE — Patient Outreach (Signed)
  Care Coordination   Initial Visit Note   09/13/2021 Name: Hannah [name] MRN: [id number] DOB: 27-May-1940  Hannah [name] is a 81 y.o. year old female who sees [name], MD for primary care. I spoke with  [name] by phone today.  What matters to the patients health and wellness today?  No needs    Goals Addressed               This Visit's Progress     COMPLETED: No needs (pt-stated)        Care Coordination Interventions: Reviewed medications with patient and discussed adherence with all her medications. Reminded pt to call in her medications one week prior to avoid running out of medications and prevent any delays on administration Reviewed scheduled/upcoming provider appointments including pending appointments and verified pt has completed her AWV on 06/24/2021 Screening for signs and symptoms of depression related to chronic disease state  Assessed social determinant of health barriers          SDOH assessments and interventions completed:  Yes  SDOH Interventions Today    Flowsheet Row Most Recent Value  SDOH Interventions   Food Insecurity Interventions Intervention Not Indicated  Housing Interventions Intervention Not Indicated  Transportation Interventions Ambulatory [id number] Order        Care Coordination Interventions Activated:  Yes  Care Coordination Interventions:  Yes, provided   Follow up plan: No further intervention required.   Encounter Outcome:  Pt. Visit Completed   Elliot Cousin, RN Care Management Coordinator [facility] Main Office [phone]

## 2021-09-13 NOTE — Patient Instructions (Signed)
Visit Information  Thank you for taking time to visit with me today. Please don't hesitate to contact me if I can be of assistance to you.   Following are the goals we discussed today:   Goals Addressed               This Visit's Progress     COMPLETED: No needs (pt-stated)        Care Coordination Interventions: Reviewed medications with patient and discussed adherence with all her medications. Reminded pt to call in her medications one week prior to avoid running out of medications and prevent any delays on administration Reviewed scheduled/upcoming provider appointments including pending appointments and verified pt has completed her AWV on 06/24/2021 Screening for signs and symptoms of depression related to chronic disease state  Assessed social determinant of health barriers         Please call the care guide team at [phone] if you need to cancel or reschedule your appointment.   If you are experiencing a Mental Health or Behavioral Health Crisis or need someone to talk to, please call the Suicide and Crisis Lifeline: 988  Patient verbalizes understanding of instructions and care plan provided today and agrees to view in MyChart. Active MyChart status and patient understanding of how to access instructions and care plan via MyChart confirmed with patient.     No further follow up required: No needs.  Elliot Cousin, RN Care Management Coordinator [facility] Main Office [phone]

## 2021-09-13 NOTE — Telephone Encounter (Signed)
No answer at the patient's home number. 

## 2021-09-14 NOTE — Telephone Encounter (Signed)
Spoke with the patient and she stated she wanted to know if she needed to stop taking the thyroid medication.  I advised the patient Dr Russella Dar did not state to stop the medication and she agreed get the Rx refilled.

## 2021-09-16 DIAGNOSIS — Z4801 Encounter for change or removal of surgical wound dressing: Secondary | ICD-10-CM | POA: Diagnosis not present

## 2021-09-16 DIAGNOSIS — Z933 Colostomy status: Secondary | ICD-10-CM | POA: Diagnosis not present

## 2021-09-16 DIAGNOSIS — E119 Type 2 diabetes mellitus without complications: Secondary | ICD-10-CM | POA: Diagnosis not present

## 2021-09-16 DIAGNOSIS — K56609 Unspecified intestinal obstruction, unspecified as to partial versus complete obstruction: Secondary | ICD-10-CM | POA: Diagnosis not present

## 2021-09-18 DIAGNOSIS — Z933 Colostomy status: Secondary | ICD-10-CM | POA: Diagnosis not present

## 2021-09-18 DIAGNOSIS — E119 Type 2 diabetes mellitus without complications: Secondary | ICD-10-CM | POA: Diagnosis not present

## 2021-09-18 DIAGNOSIS — K56609 Unspecified intestinal obstruction, unspecified as to partial versus complete obstruction: Secondary | ICD-10-CM | POA: Diagnosis not present

## 2021-09-18 DIAGNOSIS — Z4801 Encounter for change or removal of surgical wound dressing: Secondary | ICD-10-CM | POA: Diagnosis not present

## 2021-09-28 ENCOUNTER — Telehealth: Payer: Self-pay | Admitting: Pharmacist

## 2021-09-28 NOTE — Chronic Care Management (AMB) (Signed)
    Chronic Care Management Pharmacy Assistant   Name: Jameria Bradway  MRN: [id number] DOB: Oct 12, 1940  Reason for Encounter: Follow up Entresto PAP  Spoke with Delana Meyer at [employer], patient has been approved through 01/02/2022. Shipment date will be 09/30/2021 and should arrive at patients address by 10/04/2021. Patient notified.   [place] Pharmacist Assistant [phone]

## 2021-09-29 ENCOUNTER — Ambulatory Visit: Payer: Medicare Other | Attending: Internal Medicine

## 2021-09-29 DIAGNOSIS — I2699 Other pulmonary embolism without acute cor pulmonale: Secondary | ICD-10-CM | POA: Diagnosis not present

## 2021-09-29 DIAGNOSIS — Z5181 Encounter for therapeutic drug level monitoring: Secondary | ICD-10-CM

## 2021-09-29 LAB — POCT INR: INR: 1.7 — AB (ref 2.0–3.0)

## 2021-09-29 NOTE — Patient Instructions (Signed)
Description   Take 1 tablet today and 1 tablet tomorrow and then continue taking Warfarin 1/2 tablet daily except for 1 tablet on Mondays and Fridays.  Keep a green leafy veggie in your diet 2-3 times a week.  Recheck INR in 2 weeks.   [facility] [phone].

## 2021-10-07 DIAGNOSIS — K56609 Unspecified intestinal obstruction, unspecified as to partial versus complete obstruction: Secondary | ICD-10-CM | POA: Diagnosis not present

## 2021-10-07 DIAGNOSIS — Z4801 Encounter for change or removal of surgical wound dressing: Secondary | ICD-10-CM | POA: Diagnosis not present

## 2021-10-07 DIAGNOSIS — E119 Type 2 diabetes mellitus without complications: Secondary | ICD-10-CM | POA: Diagnosis not present

## 2021-10-07 DIAGNOSIS — Z933 Colostomy status: Secondary | ICD-10-CM | POA: Diagnosis not present

## 2021-10-13 ENCOUNTER — Ambulatory Visit: Payer: Medicare Other | Attending: Cardiology | Admitting: *Deleted

## 2021-10-13 DIAGNOSIS — Z5181 Encounter for therapeutic drug level monitoring: Secondary | ICD-10-CM | POA: Diagnosis not present

## 2021-10-13 DIAGNOSIS — I2699 Other pulmonary embolism without acute cor pulmonale: Secondary | ICD-10-CM

## 2021-10-13 LAB — POCT INR: INR: 2.4 (ref 2.0–3.0)

## 2021-10-13 NOTE — Patient Instructions (Signed)
Description   Take 1 tablet today then start taking Warfarin 1/2 tablet daily except for 1 tablet on Mondays, Wednesdays, and Fridays.  Keep a green leafy veggie in your diet 2-3 times a week. Recheck INR in 2 weeks.  [facility] [phone].

## 2021-10-21 ENCOUNTER — Telehealth: Payer: Self-pay | Admitting: Pharmacist

## 2021-10-21 ENCOUNTER — Ambulatory Visit: Payer: Medicare Other | Admitting: Internal Medicine

## 2021-10-21 NOTE — Chronic Care Management (AMB) (Signed)
    Chronic Care Management Pharmacy Assistant   Name: Hannah [name]  MRN: [id number] DOB: 12-30-1940  Reason for Encounter: Follow up Hannah [name]  Patient restarted Hannah [name], she states she is not having any low blood pressure readings, she states she feels fine and not having any side effects since starting Entresto.  Patient is checking her blood pressure daily, her recent readings have been 132/72, 142/69, 125/70, 130/65, and 135/64.  [place] Pharmacist Assistant [phone]

## 2021-10-27 ENCOUNTER — Ambulatory Visit: Payer: Medicare Other | Attending: Internal Medicine

## 2021-10-27 DIAGNOSIS — I2699 Other pulmonary embolism without acute cor pulmonale: Secondary | ICD-10-CM

## 2021-10-27 DIAGNOSIS — Z5181 Encounter for therapeutic drug level monitoring: Secondary | ICD-10-CM | POA: Diagnosis not present

## 2021-10-27 LAB — POCT INR: INR: 3.5 — AB (ref 2.0–3.0)

## 2021-10-27 NOTE — Patient Instructions (Signed)
Description   Continue taking Warfarin 1/2 tablet daily except for 1 tablet on Mondays, Wednesdays, and Fridays. Keep a green leafy veggie in your diet 2-3 times a week.  Recheck INR in 3 weeks.  [facility] [phone].

## 2021-11-17 ENCOUNTER — Ambulatory Visit: Payer: Medicare Other | Attending: Internal Medicine | Admitting: *Deleted

## 2021-11-17 DIAGNOSIS — Z5181 Encounter for therapeutic drug level monitoring: Secondary | ICD-10-CM

## 2021-11-17 DIAGNOSIS — I2699 Other pulmonary embolism without acute cor pulmonale: Secondary | ICD-10-CM | POA: Diagnosis not present

## 2021-11-17 LAB — POCT INR: INR: 3.7 — AB (ref 2.0–3.0)

## 2021-11-17 NOTE — Patient Instructions (Signed)
Description   Today take 1/2 tablet then continue taking Warfarin 1/2 tablet daily except for 1 tablet on Mondays, Wednesdays, and Fridays. Keep a green leafy veggie in your diet 2-3 times a week. Recheck INR in 3 weeks.  [facility] [phone].

## 2021-11-22 ENCOUNTER — Other Ambulatory Visit: Payer: Self-pay

## 2021-11-22 ENCOUNTER — Other Ambulatory Visit: Payer: Self-pay | Admitting: Internal Medicine

## 2021-11-22 ENCOUNTER — Telehealth: Payer: Self-pay | Admitting: Internal Medicine

## 2021-11-22 DIAGNOSIS — I480 Paroxysmal atrial fibrillation: Secondary | ICD-10-CM

## 2021-11-22 MED ORDER — WARFARIN SODIUM 5 MG PO TABS: ORAL_TABLET | ORAL | 1 refills | Status: DC

## 2021-11-22 NOTE — Telephone Encounter (Signed)
Warfarin 5mg  refill Last INR 11/17/2021 Last OV 05/21/2021

## 2021-11-22 NOTE — Telephone Encounter (Signed)
*  STAT* If patient is at the pharmacy, call can be transferred to refill team.   1. Which medications need to be refilled? (please list name of each medication and dose if known) warfarin (COUMADIN) 5 MG tablet  2. Which pharmacy/location (including street and city if local pharmacy) is medication to be sent to? [facility] - [place] ([place]), [place] - [address]   3. Do they need a 30 day or 90 day supply? 30  Pt states that she only has half pill left.

## 2021-11-23 ENCOUNTER — Emergency Department (HOSPITAL_COMMUNITY): Payer: Medicare Other

## 2021-11-23 ENCOUNTER — Emergency Department (HOSPITAL_COMMUNITY)
Admission: EM | Admit: 2021-11-23 | Discharge: 2021-11-23 | Disposition: A | Discharge status: Home / Self Care | Payer: Medicare Other | Attending: Emergency Medicine | Admitting: Emergency Medicine

## 2021-11-23 ENCOUNTER — Encounter (HOSPITAL_COMMUNITY): Payer: Self-pay | Admitting: Emergency Medicine

## 2021-11-23 ENCOUNTER — Other Ambulatory Visit: Payer: Self-pay

## 2021-11-23 DIAGNOSIS — R072 Precordial pain: Secondary | ICD-10-CM | POA: Diagnosis not present

## 2021-11-23 DIAGNOSIS — R42 Dizziness and giddiness: Secondary | ICD-10-CM | POA: Insufficient documentation

## 2021-11-23 DIAGNOSIS — R0789 Other chest pain: Secondary | ICD-10-CM | POA: Diagnosis not present

## 2021-11-23 DIAGNOSIS — I130 Hypertensive heart and chronic kidney disease with heart failure and stage 1 through stage 4 chronic kidney disease, or unspecified chronic kidney disease: Secondary | ICD-10-CM | POA: Insufficient documentation

## 2021-11-23 DIAGNOSIS — E1122 Type 2 diabetes mellitus with diabetic chronic kidney disease: Secondary | ICD-10-CM | POA: Diagnosis not present

## 2021-11-23 DIAGNOSIS — E039 Hypothyroidism, unspecified: Secondary | ICD-10-CM | POA: Diagnosis not present

## 2021-11-23 DIAGNOSIS — I5032 Chronic diastolic (congestive) heart failure: Secondary | ICD-10-CM | POA: Insufficient documentation

## 2021-11-23 DIAGNOSIS — Z96659 Presence of unspecified artificial knee joint: Secondary | ICD-10-CM | POA: Insufficient documentation

## 2021-11-23 DIAGNOSIS — N1831 Chronic kidney disease, stage 3a: Secondary | ICD-10-CM | POA: Diagnosis not present

## 2021-11-23 DIAGNOSIS — Z96642 Presence of left artificial hip joint: Secondary | ICD-10-CM | POA: Insufficient documentation

## 2021-11-23 DIAGNOSIS — R110 Nausea: Secondary | ICD-10-CM | POA: Insufficient documentation

## 2021-11-23 DIAGNOSIS — Z87891 Personal history of nicotine dependence: Secondary | ICD-10-CM | POA: Diagnosis not present

## 2021-11-23 DIAGNOSIS — R079 Chest pain, unspecified: Secondary | ICD-10-CM

## 2021-11-23 DIAGNOSIS — Z7901 Long term (current) use of anticoagulants: Secondary | ICD-10-CM | POA: Insufficient documentation

## 2021-11-23 LAB — CBC
HCT: 41.6 % (ref 36.0–46.0)
Hemoglobin: 13 g/dL (ref 12.0–15.0)
MCH: 24.7 pg — ABNORMAL LOW (ref 26.0–34.0)
MCHC: 31.3 g/dL (ref 30.0–36.0)
MCV: 79.1 fL — ABNORMAL LOW (ref 80.0–100.0)
Platelets: 283 10*3/uL (ref 150–400)
RBC: 5.26 MIL/uL — ABNORMAL HIGH (ref 3.87–5.11)
RDW: 22.1 % — ABNORMAL HIGH (ref 11.5–15.5)
WBC: 8.9 10*3/uL (ref 4.0–10.5)
nRBC: 0 % (ref 0.0–0.2)

## 2021-11-23 LAB — BASIC METABOLIC PANEL
Anion gap: 15 (ref 5–15)
BUN: 28 mg/dL — ABNORMAL HIGH (ref 8–23)
CO2: 16 mmol/L — ABNORMAL LOW (ref 22–32)
Calcium: 9.1 mg/dL (ref 8.9–10.3)
Chloride: 109 mmol/L (ref 98–111)
Creatinine, Ser: 1.72 mg/dL — ABNORMAL HIGH (ref 0.44–1.00)
GFR, Estimated: 30 mL/min — ABNORMAL LOW (ref >60)
Glucose, Bld: 138 mg/dL — ABNORMAL HIGH (ref 70–99)
Potassium: 4.4 mmol/L (ref 3.5–5.1)
Sodium: 140 mmol/L (ref 135–145)

## 2021-11-23 LAB — TROPONIN I (HIGH SENSITIVITY)
Troponin I (High Sensitivity): 9 ng/L (ref <18)
Troponin I (High Sensitivity): 9 ng/L (ref <18)

## 2021-11-23 LAB — BRAIN NATRIURETIC PEPTIDE: B Natriuretic Peptide: 109.4 pg/mL — ABNORMAL HIGH (ref 0.0–100.0)

## 2021-11-23 LAB — PROTIME-INR
INR: 2.6 — ABNORMAL HIGH (ref 0.8–1.2)
Prothrombin Time: 27.9 seconds — ABNORMAL HIGH (ref 11.4–15.2)

## 2021-11-23 NOTE — ED Provider Triage Note (Signed)
Emergency Medicine Provider Triage Evaluation Note  Lavetta Geier , a 81 y.o. female  was evaluated in triage.  Pt complains of chest pain that started a couple hours ago.  Patient described the pain as sharp, intermittent, located on her left chest, nonradiating.  Denies any vision changes.  No nausea, vomiting, fever, shortness of breath, bowel changes, urinary symptoms.  Review of Systems  Positive: As above Negative: As above  Physical Exam  BP 136/81   Pulse 95   Temp 98.6 F (37 C) (Oral)   Resp 18   Ht 5\' 7"  (1.702 m)   Wt 88.6 kg   SpO2 98%   BMI 30.59 kg/m  Gen:   Awake, no distress   Resp:  Normal effort  MSK:   Moves extremities without difficulty  Other:    Medical Decision Making  Medically screening exam initiated at 6:57 PM.  Appropriate orders placed.  Crissy Mccreadie was informed that the remainder of the evaluation will be completed by another provider, this initial triage assessment does not replace that evaluation, and the importance of remaining in the ED until their evaluation is complete.     [name], Jeanelle Malling 11/23/21 2114

## 2021-11-23 NOTE — ED Triage Notes (Signed)
Pt endorses central CP starting this evening. Denies any SOB.

## 2021-11-23 NOTE — Discharge Instructions (Signed)
It was a pleasure caring for you today in the emergency department.  Please return to the emergency department for any worsening or worrisome symptoms.   Return to the Emergency Department if you have unusual chest pain, pressure, or discomfort, shortness of breath, nausea, vomiting, burping, heartburn, tingling upper body parts, sweating, cold, clammy skin, or racing heartbeat. Call 911 if you think you are having a heart attack. Take all cardiac medications as prescribed - notify your doctor if you have any side effects. Follow cardiac diet - avoid fatty & fried foods, don't eat too much red meat, eat lots of fruits & vegetables, and dairy products should be low fat. Please lose weight if you are overweight. Become more active with walking, gardening, or any other activity that gets you to moving.   Please return to the emergency department immediately for any new or concerning symptoms, or if you get worse.  

## 2021-11-23 NOTE — ED Provider Notes (Signed)
[facility] EMERGENCY DEPARTMENT Provider Note   CSN: [id number] Arrival date & time: 11/23/21  1841     History  Chief Complaint  Patient presents with   Chest Pain    Hannah [name] is a 81 y.o. female.  Patient as above with significant medical history as below, including PE on DOAC, diastolic CHF, DM2, HLD, HTP, PAF, pulm htn who presents to the ED with complaint of chest pain. Pt reports onset of pain around 1 hour ago, began at rest while watching tv/lying down. Sharp/stabbing pain to mid-epigastrium/mid sternal area. Had some pain to her right arm in conjunction with this. Mild nausea and felt light headed. Symptoms resolved after a few minutes and have not returned, resolved spontaneously. No dib or cp currently on assessment. She is feeling back to her baseline. She is complaint with her home medications. No fevers/chills/dib/cough/weight changes/sig leg swelling     Past Medical History:  Diagnosis Date   Acute massive pulmonary embolism ([place]) 07/13/2012   Massive PE w/ PEA arrest 07/13/12 >TNK >IVC filter >discharged on comadin     Anticoagulated on warfarin    Chronic diastolic CHF (congestive heart failure) ([place]) 03/15/2017   Chronic kidney disease, stage 3a ([place]) 12/15/2018   Diabetes mellitus, type 2 ([place]) 07/15/2012   with peripheral neuropathy   DM2 (diabetes mellitus, type 2) ([place]) 07/23/2012   HFrEF (heart failure with reduced ejection fraction) ([place]) 03/15/2017   Non-ischemic cardiomyopathy // Cardiac catheterization 01/01/21:  RCA prox to mid 20 // Echocardiogram 12/26/20:  EF 30-35, ant septum/apex, mid inf-sept AK; ant-lat/ant/inf HK, Gr 1 DD, mild LVH, normal RVSF, RVSP 27.2, small effusion (ant to RV), mild MR, mod TR, AV sclerosis w/o AS   Hyperlipemia 07/14/2012   Hyperlipidemia 02/25/2013   Hypertension 07/14/2012   Hypothyroid 06/06/2013   Large bowel stricture ([place])    s/p colectomy in 2016   Neuropathy 02/12/2015   Osteoarthritis    s/p hip  and knee replacements   PAF (paroxysmal atrial fibrillation) ([place])    Pulmonary hypertension ([place]) 07/26/2016   Syncope 10/2018   Thyroid disease     Past Surgical History:  Procedure Laterality Date   COLONOSCOPY N/A 03/12/2016   Procedure: COLONOSCOPY;  Surgeon: Teena Irani, MD;  Location: [place];  Service: Endoscopy;  Laterality: N/A;   COLOSTOMY N/A 06/03/2014   Procedure: COLOSTOMY;  Surgeon: Erroll Luna, MD;  Location: [place];  Service: General;  Laterality: N/A;   FLEXIBLE SIGMOIDOSCOPY N/A 05/30/2014   Procedure: Beryle Quant;  Surgeon: Carol Ada, MD;  Location: [facility] ENDOSCOPY;  Service: Endoscopy;  Laterality: N/A;   INSERTION OF VENA CAVA FILTER N/A 07/16/2012   Procedure: INSERTION OF VENA CAVA FILTER;  Surgeon: Elam Dutch, MD;  Location: [facility] CATH LAB;  Service: Cardiovascular;  Laterality: N/A;   KNEE ARTHROSCOPY     LEFT HEART CATH AND CORONARY ANGIOGRAPHY N/A 01/01/2021   Procedure: LEFT HEART CATH AND CORONARY ANGIOGRAPHY;  Surgeon: Burnell Blanks, MD;  Location: [place] CV LAB;  Service: Cardiovascular;  Laterality: N/A;   PARTIAL COLECTOMY N/A 06/03/2014   Procedure: PARTIAL COLECTOMY;  Surgeon: Erroll Luna, MD;  Location: [place];  Service: General;  Laterality: N/A;   REDUCTION MAMMAPLASTY Bilateral    RIGHT HEART CATH N/A 12/30/2020   Procedure: RIGHT HEART CATH;  Surgeon: Burnell Blanks, MD;  Location: [place] CV LAB;  Service: Cardiovascular;  Laterality: N/A;   SP ARTHRO HIP*L*       The history  is provided by the patient. No language interpreter was used.  Chest Pain Associated symptoms: nausea and shortness of breath   Associated symptoms: no abdominal pain, no back pain, no cough, no dysphagia, no fever, no headache and no palpitations        Home Medications Prior to Admission medications   Medication Sig Start Date End Date Taking? Authorizing Provider  acetaminophen (TYLENOL) 500 MG tablet Take 500 mg by  mouth every 6 (six) hours as needed for mild pain.     [provider]  atorvastatin (LIPITOR) 40 MG tablet Take 1 tablet by mouth once daily 05/18/21   Koberlein, Andris Flurry C, MD  Blood Glucose Monitoring Suppl (ONETOUCH VERIO) w/Device KIT 1 each by Does not apply route as directed. 06/07/18   [name], MD  dapagliflozin propanediol (FARXIGA) 10 MG TABS tablet Take 1 tablet (10 mg total) by mouth daily before breakfast. 03/24/21   Richardson Dopp T, PA-C  diclofenac Sodium (VOLTAREN) 1 % GEL Apply 4 g topically 4 (four) times daily. 06/26/19   Lamptey, Myrene Galas, MD  furosemide (LASIX) 20 MG tablet Take 1 tablet (20 mg total) by mouth daily as needed for fluid or edema. 03/10/21   Koberlein, Andris Flurry C, MD  glucose blood (ONETOUCH VERIO) test strip USE 1 STRIP TO CHECK GLUCOSE ONCE DAILY AS DIRECTED 08/14/20   [name], MD  levothyroxine (EUTHYROX) 25 MCG tablet Take 1 tablet (25 mcg total) by mouth daily before breakfast. 08/19/21   Farrel Conners, MD  metoprolol succinate (TOPROL-XL) 50 MG 24 hr tablet Take 1 tablet (50 mg total) by mouth every evening. 02/17/21 05/21/21  Richardson Dopp T, PA-C  OneTouch Delica Lancets 33L MISC USE 1  TO CHECK GLUCOSE ONCE DAILY 07/02/18   Koberlein, Steele Berg, MD  pantoprazole (PROTONIX) 40 MG tablet Take 1 tablet (40 mg total) by mouth daily. 02/26/21   Koberlein, Steele Berg, MD  sacubitril-valsartan (ENTRESTO) 24-26 MG Take 1 tablet by mouth 2 (two) times daily. 09/08/21   Farrel Conners, MD  spironolactone (ALDACTONE) 25 MG tablet Take 0.5 tablets (12.5 mg total) by mouth daily. 03/23/21 03/18/22  Richardson Dopp T, PA-C  vitamin B-12 (CYANOCOBALAMIN) 1000 MCG tablet Take 1 tablet (1,000 mcg total) by mouth daily. 05/15/20   [name], MD  vitamin C (ASCORBIC ACID) 500 MG tablet Take 500 mg by mouth daily.     [provider]  warfarin (COUMADIN) 5 MG tablet TAKE 1-2 TABLETS BY MOUTH ONCE DAILY OR AS DIRECTED  BY  COUMADIN  CLINIC  11/22/21   Fay Records, MD      Allergies    Augmentin [amoxicillin-pot clavulanate], Tranxene [clorazepate], Crestor [rosuvastatin], and Penicillins    Review of Systems   Review of Systems  Constitutional:  Negative for activity change and fever.  HENT:  Negative for facial swelling and trouble swallowing.   Eyes:  Negative for discharge and redness.  Respiratory:  Positive for shortness of breath. Negative for cough.   Cardiovascular:  Positive for chest pain. Negative for palpitations.  Gastrointestinal:  Positive for nausea. Negative for abdominal pain.  Genitourinary:  Negative for dysuria and flank pain.  Musculoskeletal:  Negative for back pain and gait problem.  Skin:  Negative for pallor and rash.  Neurological:  Positive for light-headedness. Negative for syncope and headaches.    Physical Exam Updated Vital Signs BP 131/76   Pulse 81   Temp 98.6 F (37 C) (Oral)   Resp Marland Kitchen)  21   Ht _0  (1.702 m)   Wt 88.6 kg   SpO2 95%   BMI 30.59 kg/m  Physical Exam Vitals and nursing note reviewed.  Constitutional:      General: She is not in acute distress.    Appearance: Normal appearance. She is well-developed. She is not ill-appearing, toxic-appearing or diaphoretic.  HENT:     Head: Normocephalic and atraumatic.     Right Ear: External ear normal.     Left Ear: External ear normal.     Nose: Nose normal.     Mouth/Throat:     Mouth: Mucous membranes are moist.  Eyes:     General: No scleral icterus.       Right eye: No discharge.        Left eye: No discharge.  Cardiovascular:     Rate and Rhythm: Normal rate and regular rhythm.     Pulses: Normal pulses.     Heart sounds: Normal heart sounds.     No S3 or S4 sounds.  Pulmonary:     Effort: Pulmonary effort is normal. No respiratory distress.     Breath sounds: Normal breath sounds. No decreased breath sounds or wheezing.  Abdominal:     General: Abdomen is flat.     Palpations: Abdomen is soft.      Tenderness: There is no abdominal tenderness. There is no guarding or rebound.  Musculoskeletal:        General: Normal range of motion.     Cervical back: Normal range of motion.     Right lower leg: No edema.     Left lower leg: No edema.  Skin:    General: Skin is warm and dry.     Capillary Refill: Capillary refill takes less than 2 seconds.  Neurological:     Mental Status: She is alert.  Psychiatric:        Mood and Affect: Mood normal.        Behavior: Behavior normal.     ED Results / Procedures / Treatments   Labs (all labs ordered are listed, but only abnormal results are displayed) Labs Reviewed  BASIC METABOLIC PANEL - Abnormal; Notable for the following components:      Result Value   CO2 16 (*)    Glucose, Bld 138 (*)    BUN 28 (*)    Creatinine, Ser 1.72 (*)    GFR, Estimated 30 (*)    All other components within normal limits  CBC - Abnormal; Notable for the following components:   RBC 5.26 (*)    MCV 79.1 (*)    MCH 24.7 (*)    RDW 22.1 (*)    All other components within normal limits  BRAIN NATRIURETIC PEPTIDE - Abnormal; Notable for the following components:   B Natriuretic Peptide 109.4 (*)    All other components within normal limits  PROTIME-INR - Abnormal; Notable for the following components:   Prothrombin Time 27.9 (*)    INR 2.6 (*)    All other components within normal limits  TROPONIN I (HIGH SENSITIVITY)  TROPONIN I (HIGH SENSITIVITY)    EKG EKG Interpretation  Date/Time:  Tuesday November 23 2021 18:40:11 EST Ventricular Rate:  90 PR Interval:  156 QRS Duration: 120 QT Interval:  350 QTC Calculation: 428 R Axis:   -65 Text Interpretation: Normal sinus rhythm Left anterior fascicular block Left ventricular hypertrophy with QRS widening and repolarization abnormality ( R in aVL , Cornell product ,  Romhilt-Estes ) Cannot rule out Septal infarct , age undetermined Abnormal ECG When compared with ECG of 16-Jun-2021 19:39, PREVIOUS ECG  IS PRESENT Interpretation limited secondary to artifact similar to prior Confirmed by Wynona Dove ([id number]) on 11/23/2021 8:24:46 PM  Radiology DG Chest 2 View  Result Date: 11/23/2021 CLINICAL DATA:  chest pain EXAM: CHEST - 2 VIEW COMPARISON:  Chest x-ray 06/16/2021, CT chest 12/15/2018, CT abdomen pelvis 04/22/2020 FINDINGS: Mildly enlarged cardiac silhouette. The heart and mediastinal contours are unchanged. Aortic calcification. Left base streaky airspace opacities likely represent atelectasis. No focal consolidation. No pulmonary edema. No pleural effusion. No pneumothorax. No acute osseous abnormality. IMPRESSION: 1. No active cardiopulmonary disease. 2.  Aortic Atherosclerosis (ICD10-I70.0). Electronically Signed   By: Iven Finn M.D.   On: 11/23/2021 19:28    Procedures Procedures    Medications Ordered in ED Medications - No data to display  ED Course/ Medical Decision Making/ A&P                           Medical Decision Making Amount and/or Complexity of Data Reviewed Labs: ordered.   This patient presents to the ED with chief complaint(s) of cp with pertinent past medical history of as above which further complicates the presenting complaint. The complaint involves an extensive differential diagnosis and also carries with it a high risk of complications and morbidity.     Differential includes all life-threatening causes for chest pain. This includes but is not exclusive to acute coronary syndrome, aortic dissection, pulmonary embolism, cardiac tamponade, community-acquired pneumonia, pericarditis, musculoskeletal chest wall pain, etc.  . Serious etiologies were considered.   The initial plan is to screening labs EKG cxr in triage, will get delta trop, add bnp; currently asymptomatic    Additional history obtained: Additional history obtained from  na Records reviewed [place] and prior ed visits, prior labs/imaging/home meds  Independent labs  interpretation:  The following labs were independently interpreted: . Cbc wnl Bmp similar to prior, Cr elev, hx CKD Troponin 9 >> 9 BNP minimally elevated INR 2.6  Independent visualization of imaging: - I independently visualized the following imaging with scope of interpretation limited to determining acute life threatening conditions related to emergency care: CXR, which revealed wnl  Cardiac monitoring was reviewed and interpreted by myself which shows similar tracing to prior no stemi  Treatment and Reassessment: Stayed the same, she is asymptomatic  Consultation: - Consulted or discussed management/test interpretation w/ external professional: na  Consideration for admission or further workup: Admission was considered   Pt is asymptomatic, she has heart score of 4, her trop is neg x2, EKG is without acute ischemic changes, she is asymptomatic and HDS. Her warfarin is therapeutic, PE is unlikely given HDS and lack of ongoing symptoms. Chest pain likely atypical at this time. She remains asymptomatic.  She had echo 5/23 with EF 60-65%, G1DD; improved from prior. [place] 12/22. Will have her follow up with cardiology given elevated heart score.   The patient improved significantly and was discharged in stable condition. Detailed discussions were had with the patient regarding current findings, and need for close f/u with PCP or on call doctor. The patient has been instructed to return immediately if the symptoms worsen in any way for re-evaluation. Patient verbalized understanding and is in agreement with current care plan. All questions answered prior to discharge.   Social Determinants of health: Social History   Tobacco Use   Smoking  status: Former    Packs/day: 1.00    Years: 10.00    Total pack years: 10.00    Types: Cigarettes    Quit date: 01/04/1968    Years since quitting: 53.9   Smokeless tobacco: Never  Vaping Use   Vaping Use: Never used  Substance Use Topics    Alcohol use: No   Drug use: No            Final Clinical Impression(s) / ED Diagnoses Final diagnoses:  Moderate risk chest pain  Anticoagulated on warfarin    Rx / DC Orders ED Discharge Orders          Ordered    Ambulatory referral to Cardiology        11/23/21 2233              Jeanell Sparrow, DO 11/23/21 2311

## 2021-11-23 NOTE — ED Notes (Signed)
Pt verbalized understanding of d/c instructions and follow up care. Pt reports she is calling for a ride home and someone will be here to pick her up.

## 2021-11-24 ENCOUNTER — Telehealth: Payer: Self-pay

## 2021-11-24 NOTE — Telephone Encounter (Signed)
Transition Care Management Follow-up Telephone Call Date of discharge and from where: 11/23/21 from [facility] ED for Chest pain, unspecified How have you been since you were released from the hospital? Pt states she feeling better Any questions or concerns? No  Items Reviewed: Did the pt receive and understand the discharge instructions provided? Yes  Medications obtained and verified? Yes  Other? No  Any new allergies since your discharge? No  Do you have support at home? Yes   Home Care and Equipment/Supplies: Were home health services ordered? Pt already has home health. They come M, W, F If so, what is the name of the agency? [facility]  Were any new equipment or medical supplies ordered?  No   Follow up appointments reviewed:  PCP Hospital f/u appt confirmed? Yes  Scheduled to see Dr [name] on 11/29/21 @ 4pm. Are transportation arrangements needed? No  If their condition worsens, is the pt aware to call PCP or go to the Emergency Dept.? Yes Was the patient provided with contact information for the PCP's office or ED? Yes Was to pt encouraged to call back with questions or concerns? Yes

## 2021-11-29 ENCOUNTER — Encounter: Payer: Self-pay | Admitting: Family Medicine

## 2021-11-29 ENCOUNTER — Ambulatory Visit (INDEPENDENT_AMBULATORY_CARE_PROVIDER_SITE_OTHER): Payer: Medicare Other | Admitting: Family Medicine

## 2021-11-29 VITALS — BP 136/80 | HR 82 | Temp 97.5°F | Ht 67.0 in | Wt 199.0 lb

## 2021-11-29 DIAGNOSIS — R0789 Other chest pain: Secondary | ICD-10-CM | POA: Diagnosis not present

## 2021-11-29 NOTE — Progress Notes (Unsigned)
Established Patient Office Visit  Subjective   Patient ID: Hannah [name], female    DOB: 1940/06/11  Age: 81 y.o. MRN: [id number]  Chief Complaint  Patient presents with   Follow-up    Patient is here for ER follow up. She was seen on 11/21 for chest pain. States that they did a work up and did not find anything revealing. EKG showed NSR and CXR was without acute findings. I have reviewed the ER note with the patient. She reports no further chest pain since she left the ER. Patient thinks that the chest pain was from stress, states that she has had 3 people that she knows have passed away recently. She denies any other associated symptoms or issues.    Current Outpatient Medications  Medication Instructions   acetaminophen (TYLENOL) 500 mg, Oral, Every 6 hours PRN   ascorbic acid (VITAMIN C) 500 mg, Oral, Daily   atorvastatin (LIPITOR) 40 MG tablet Take 1 tablet by mouth once daily   Blood Glucose Monitoring Suppl (ONETOUCH VERIO) w/Device KIT 1 each, Does not apply, As directed   cyanocobalamin (VITAMIN B12) 1,000 mcg, Oral, Daily   dapagliflozin propanediol (FARXIGA) 10 mg, Oral, Daily before breakfast   diclofenac Sodium (VOLTAREN) 4 g, Topical, 4 times daily   furosemide (LASIX) 20 mg, Oral, Daily PRN   glucose blood (ONETOUCH VERIO) test strip USE 1 STRIP TO CHECK GLUCOSE ONCE DAILY AS DIRECTED   levothyroxine (EUTHYROX) 25 mcg, Oral, Daily before breakfast   metoprolol succinate (TOPROL-XL) 50 mg, Oral, Every evening   OneTouch Delica Lancets 17R MISC USE 1  TO CHECK GLUCOSE ONCE DAILY   pantoprazole (PROTONIX) 40 mg, Oral, Daily   sacubitril-valsartan (ENTRESTO) 24-26 MG 1 tablet, Oral, 2 times daily   spironolactone (ALDACTONE) 12.5 mg, Oral, Daily   warfarin (COUMADIN) 5 MG tablet TAKE 1-2 TABLETS BY MOUTH ONCE DAILY OR AS DIRECTED  BY  COUMADIN  CLINIC    Patient Active Problem List   Diagnosis Date Noted   Elevated troponin    Dilated cardiomyopathy (HCC)    Ischemic  cardiomyopathy    PAF (paroxysmal atrial fibrillation) (HCC)    Non-ST elevation (NSTEMI) myocardial infarction ([place]) 12/25/2020   Thrombocytopenia ([place]) 04/23/2020   CAD (coronary artery disease) 06/24/2019   Hypertension 04/25/2019   Atypical chest pain 12/15/2018   Chronic kidney disease, stage 3a ([place]) 12/15/2018   Frequent falls 05/21/2018   HFimpEF (heart failure with improved ejection fraction) ([place]) 03/15/2017   Anticoagulated on warfarin    Pulmonary hypertension ([place]) 07/26/2016   Neuropathy 02/12/2015   History of pulmonary embolism 06/10/2013   Hypothyroid 06/06/2013   Diabetes mellitus with neurological manifestations, controlled ([place]) 06/06/2013   Hyperlipidemia 02/25/2013   DM2 (diabetes mellitus, type 2) ([place]) 07/23/2012      Review of Systems  Constitutional:  Negative for chills and fever.  Respiratory:  Negative for cough and shortness of breath.   Cardiovascular:  Negative for chest pain and palpitations.  Gastrointestinal:  Negative for heartburn.  All other systems reviewed and are negative.     Objective:     BP 136/80 (BP Location: Right Arm, Patient Position: Sitting, Cuff Size: Large)   Pulse 82   Temp (!) 97.5 F (36.4 C) (Oral)   Ht _0  (1.702 m)   Wt 199 lb (90.3 kg)   SpO2 98%   BMI 31.17 kg/m    Physical Exam Vitals reviewed.  Constitutional:      Appearance: Normal appearance. She is  well-groomed and normal weight.  Eyes:     Conjunctiva/sclera: Conjunctivae normal.  Neck:     Thyroid: No thyromegaly.  Cardiovascular:     Rate and Rhythm: Normal rate and regular rhythm.     Pulses: Normal pulses.     Heart sounds: S1 normal and S2 normal.  Pulmonary:     Effort: Pulmonary effort is normal.     Breath sounds: Normal breath sounds and air entry.  Abdominal:     General: Bowel sounds are normal.  Musculoskeletal:     Right lower leg: No edema.     Left lower leg: No edema.  Neurological:     Mental Status: She is alert  and oriented to person, place, and time. Mental status is at baseline.     Gait: Gait is intact.  Psychiatric:        Mood and Affect: Mood and affect normal.        Speech: Speech normal.        Behavior: Behavior normal.        Judgment: Judgment normal.      No results found for any visits on 11/29/21.    The ASCVD Risk score (Arnett DK, et al., 2019) failed to calculate for the following reasons:   The 2019 ASCVD risk score is only valid for ages 7 to 69   The patient has a prior MI or stroke diagnosis    Assessment & Plan:   Problem List Items Addressed This Visit       Unprioritized   Atypical chest pain - Primary    Unclear etiology, her work up in the ED was unremarkable. I advised that she continue all medications as prescribed and if her pain returns or if she develops any new symptoms associated with the pain to call the office for further work up.      I spent 20 minutes with the patient today reviewing her symptoms, medications, problem list and the documentation of the ED notes, imaging studies performed, etc.  Return if symptoms worsen or fail to improve.    Farrel Conners, MD

## 2021-11-30 NOTE — Assessment & Plan Note (Signed)
Unclear etiology, her work up in the ED was unremarkable. I advised that she continue all medications as prescribed and if her pain returns or if she develops any new symptoms associated with the pain to call the office for further work up.

## 2021-12-08 ENCOUNTER — Ambulatory Visit: Payer: Medicare Other | Attending: Internal Medicine

## 2021-12-08 DIAGNOSIS — I2699 Other pulmonary embolism without acute cor pulmonale: Secondary | ICD-10-CM | POA: Diagnosis not present

## 2021-12-08 DIAGNOSIS — Z5181 Encounter for therapeutic drug level monitoring: Secondary | ICD-10-CM

## 2021-12-08 LAB — POCT INR: INR: 4.8 — AB (ref 2.0–3.0)

## 2021-12-08 NOTE — Patient Instructions (Addendum)
Description   HOLD today's dose and eat greens and then START taking Warfarin 1/2 tablet daily except for 1 tablet on Wednesdays and Fridays.  Keep a green leafy veggie in your diet 2 times a week.  Recheck INR in 1 week when you see Dr Tenny Craw  [facility] [phone].

## 2021-12-10 ENCOUNTER — Telehealth: Payer: Self-pay | Admitting: Pharmacist

## 2021-12-10 ENCOUNTER — Ambulatory Visit: Payer: Medicare Other | Admitting: Podiatry

## 2021-12-10 NOTE — Chronic Care Management (AMB) (Signed)
    Chronic Care Management Pharmacy Assistant   Name: Hannah [name]  MRN: [id number] DOB: 07-May-1940  12/13/2021 APPOINTMENT REMINDER  Hannah [name] was reminded to have all medications, supplements and any blood glucose and blood pressure readings available for review with Gaylord Shih, Pharm. D, at her office visit on 12/13/2021 at 3:00.  Care Gaps: AWV - scheduled 06/29/2022 Last BP - 136/80 and 11/29/2021 Last A1C - 6.7 on 08/19/2021 Eye exam - overdue Urine ACR - overdue Flu - due Covid - overdue Shingrix - postponed  Star Rating Drug: Atorvastatin 40 mg - last filled 05/18/2021 90 DS at [facility]  Farxiga 10 mg - last filled 05/29/2021 30 DS at [facility]   Both medications verified with pharm tech at [facility]  Any gaps in medications fill history? Yes  Hannah [name] [facility]  Clinical Pharmacist Assistant [phone]

## 2021-12-13 ENCOUNTER — Ambulatory Visit (INDEPENDENT_AMBULATORY_CARE_PROVIDER_SITE_OTHER): Payer: Medicare Other | Admitting: Pharmacist

## 2021-12-13 VITALS — BP 132/70

## 2021-12-13 DIAGNOSIS — I5020 Unspecified systolic (congestive) heart failure: Secondary | ICD-10-CM

## 2021-12-13 DIAGNOSIS — E1142 Type 2 diabetes mellitus with diabetic polyneuropathy: Secondary | ICD-10-CM

## 2021-12-13 NOTE — Progress Notes (Signed)
Chronic Care Management Pharmacy Note  12/13/2021 Name:  Hannah [name] MRN:  [id number] DOB:  Feb 15, 1940  Summary: Pt is unsure of recent home BP readings  Recommendations/Changes made from today's visit: -Recommended for patient to contact eye doctor to get report from last eye exam -Recommended regular continued BP monitoring at home -Completed Entresto PAP renewal form  Plan: Follow up BP assessment in 2 months Follow up on Entresto and Farxiga PAP status   Subjective: Hannah [name] is an 81 y.o. year old female who is a primary patient of Legrand Como, Royston Cowper, MD.  The CCM team was consulted for assistance with disease management and care coordination needs.    Engaged with patient by telephone for follow up visit in response to provider referral for pharmacy case management and/or care coordination services.   Consent to Services:  The patient was given information about Chronic Care Management services, agreed to services, and gave verbal consent prior to initiation of services.  Please see initial visit note for detailed documentation.   Patient Care Team: Farrel Conners, MD as PCP - General (Family Medicine) Fay Records, MD as PCP - Cardiology (Cardiology) [name], [facility] as Pharmacist (Pharmacist) Sharmon Revere as Physician Assistant (Cardiology) Tobi Bastos, RN as [place] Management  Recent office visits: 11/29/21 Hannah Freshwater, MD: Patient presented for ER follow up.  08/19/21 Hannah Freshwater, MD: Patient presented to establish care. No medication changes made. Removed Entresto from medication list as this was too expensive. Follow up in 6 months.  06/24/21 Rolene Arbour, LPN: Patient presented for AWV.  Recent consult visits: 12/08/21 Lisette Abu, RN (cardiology): Patient presented for anticoag visit. INR 4.8, goal 2.5-3.5. Held dose x 1 day. The decreased warfarin to 5 mg (5 mg x 1) every Wed, Fri; 2.5 mg (5 mg x  0.5) all other days.   09/10/2021 Gardiner Barefoot, DPM (podiatry): Patient was seen for Dermatophytosis of nail and additional issues. No medication changes. Follow up in 3 months.   Hospital visits: Patient was seen at [facility] ED on 11/23/2021 (4 hours) due to chest pain.    New?Medications Started at [facility] Discharge:?? No medications started Medication Changes at Hospital Discharge: No medications changed Medications Discontinued at Hospital Discharge: No medications discontinued Medications that remain the same after Hospital Discharge:??  -All other medications will remain the same.  Patient was seen at [facility] ED on 06/16/2021 (7 hours) due to hypertension.    New?Medications Started at [facility] Discharge:?? No medications started Medication Changes at Hospital Discharge: No medications changed Medications Discontinued at Hospital Discharge: No medications discontinued Medications that remain the same after Hospital Discharge:??  -All other medications will remain the same.     Objective:  Lab Results  Component Value Date   CREATININE 1.72 (H) 11/23/2021   BUN 28 (H) 11/23/2021   GFR 37.65 (L) 02/15/2021   GFRNONAA 30 (L) 11/23/2021   GFRAA 45 (L) 12/03/2019   NA 140 11/23/2021   K 4.4 11/23/2021   CALCIUM 9.1 11/23/2021   CO2 16 (L) 11/23/2021   GLUCOSE 138 (H) 11/23/2021    Lab Results  Component Value Date/Time   HGBA1C 6.7 (A) 08/19/2021 01:18 PM   HGBA1C 6.3 02/15/2021 01:01 PM   HGBA1C 6.7 (H) 08/19/2020 03:16 PM   GFR 37.65 (L) 02/15/2021 01:01 PM   GFR 53.04 (L) 08/19/2020 03:16 PM   MICROALBUR 4.3 (H) 09/12/2018 03:07 PM   MICROALBUR  6.2 (H) 04/10/2015 09:01 AM    Last diabetic Eye exam:  Lab Results  Component Value Date/Time   HMDIABEYEEXA No Retinopathy 06/23/2015 12:00 AM    Last diabetic Foot exam: No results found for: "HMDIABFOOTEX"   Lab Results  Component Value Date   CHOL 150 02/15/2021    HDL 67.50 02/15/2021   LDLCALC 66 02/15/2021   TRIG 80.0 02/15/2021   CHOLHDL 2 02/15/2021       Latest Ref Rng & Units 02/15/2021    1:01 PM 01/08/2021    8:15 AM 12/28/2020    1:45 AM  Hepatic Function  Total Protein 6.0 - 8.3 g/dL 7.8  5.8  5.8   Albumin 3.5 - 5.2 g/dL 4.3  2.8  2.6   AST 0 - 37 U/L _0 ALT 0 - 35 U/L _1 Alk Phosphatase 39 - 117 U/L 57  42  42   Total Bilirubin 0.2 - 1.2 mg/dL 0.6  1.0  0.4     Lab Results  Component Value Date/Time   TSH 2.00 08/19/2021 02:02 PM   TSH 2.93 08/19/2020 03:16 PM   FREET4 1.09 02/25/2013 12:23 PM       Latest Ref Rng & Units 11/23/2021    7:08 PM 06/16/2021    1:15 PM 02/15/2021    1:01 PM  CBC  WBC 4.0 - 10.5 K/uL 8.9  9.4  9.2   Hemoglobin 12.0 - 15.0 g/dL 13.0  12.4  13.3   Hematocrit 36.0 - 46.0 % 41.6  41.8  43.1   Platelets 150 - 400 K/uL 283  305  241.0     No results found for: "VD25OH"  Clinical ASCVD: No  The ASCVD Risk score (Arnett DK, et al., 2019) failed to calculate for the following reasons:   The 2019 ASCVD risk score is only valid for ages 8 to 36   The patient has a prior MI or stroke diagnosis       09/13/2021    2:34 PM 06/24/2021    3:26 PM 01/13/2021   11:33 AM  Depression screen PHQ 2/9  Decreased Interest 0 0 0  Down, Depressed, Hopeless 0 0 0  PHQ - 2 Score 0 0 0  Altered sleeping   0  Tired, decreased energy   0  Change in appetite   0  Feeling bad or failure about yourself    3  Suicidal thoughts   0  PHQ-9 Score   3    Social History   Tobacco Use  Smoking Status Former   Packs/day: 1.00   Years: 10.00   Total pack years: 10.00   Types: Cigarettes   Quit date: 01/04/1968   Years since quitting: 53.9  Smokeless Tobacco Never   BP Readings from Last 3 Encounters:  12/13/21 132/70  11/29/21 136/80  11/23/21 131/76   Pulse Readings from Last 3 Encounters:  11/29/21 82  11/23/21 81  08/19/21 72   Wt Readings from Last 3 Encounters:  11/29/21 199 lb  (90.3 kg)  11/23/21 195 lb 5.2 oz (88.6 kg)  08/19/21 195 lb 4.8 oz (88.6 kg)   BMI Readings from Last 3 Encounters:  11/29/21 31.17 kg/m  11/23/21 30.59 kg/m  08/19/21 30.59 kg/m    Assessment/Interventions: Review of patient past medical history, allergies, medications, health status, including review of consultants reports, laboratory and other test data, was performed as part of comprehensive evaluation and provision  of chronic care management services.   SDOH:  (Social Determinants of Health) assessments and interventions performed: Yes   SDOH Interventions    Flowsheet Row Chronic Care Management from 12/13/2021 in [place] at [place] from 09/13/2021 in [place] from 06/24/2021 in [place] at [place] Management from 06/18/2020 in [place] at [place] Interventions -- Intervention Not Indicated Intervention Not Indicated --  Housing Interventions -- Intervention Not Indicated Intervention Not Indicated --  Transportation Interventions -- Ambulatory [phone] Order Intervention Not Indicated Intervention Not Indicated  Financial Strain Interventions Intervention Not Indicated -- Intervention Not Indicated Intervention Not Indicated  Physical Activity Interventions -- -- Intervention Not Indicated --  Stress Interventions -- -- Intervention Not Indicated --  Social Connections Interventions -- -- Intervention Not Indicated --       SDOH Screenings   Food Insecurity: No Food Insecurity (09/13/2021)  Housing: Low Risk  (09/13/2021)  Transportation Needs: No Transportation Needs (09/13/2021)  Alcohol Screen: Low Risk  (06/24/2021)  Depression (PHQ2-9): Low Risk  (09/13/2021)  Financial Resource Strain: Low Risk  (12/13/2021)  Physical Activity: Insufficiently Active (06/24/2021)  Social Connections: Moderately Integrated  (06/24/2021)  Stress: No Stress Concern Present (06/24/2021)  Tobacco Use: Medium Risk (11/29/2021)   CCM Care Plan  Allergies  Allergen Reactions   Augmentin [Amoxicillin-Pot Clavulanate] Itching and Other (See Comments)    Severe vaginal itching   Tranxene [Clorazepate] Itching   Crestor [Rosuvastatin]     Made her sick on stomach, she is able to tolerate the zocor   Penicillins Itching and Rash    Has patient had a PCN reaction causing immediate rash, facial/tongue/throat swelling, SOB or lightheadedness with hypotension: Yes Has patient had a PCN reaction causing severe rash involving mucus membranes or skin necrosis: No Has patient had a PCN reaction that required hospitalization: No Has patient had a PCN reaction occurring within the last 10 years: No If all of the above answers are "NO", then may proceed with Cephalosporin use.     Medications Reviewed Today     Reviewed by Agnes Lawrence, CMA (Certified Medical Assistant) on 11/29/21 at 1608  Med List Status: <None>   Medication Order Taking? Sig Documenting Provider Last Dose Status Informant  acetaminophen (TYLENOL) 500 MG tablet [id number] Yes Take 500 mg by mouth every 6 (six) hours as needed for mild pain.  [provider] Taking Active Multiple Informants  atorvastatin (LIPITOR) 40 MG tablet [id number] Yes Take 1 tablet by mouth once daily Koberlein, Junell C, MD Taking Active   Blood Glucose Monitoring Suppl (ONETOUCH VERIO) w/Device KIT [id number] Yes 1 each by Does not apply route as directed. [name], MD Taking Active Multiple Informants  dapagliflozin propanediol (FARXIGA) 10 MG TABS tablet [id number] Yes Take 1 tablet (10 mg total) by mouth daily before breakfast. Richardson Dopp T, PA-C Taking Active   diclofenac Sodium (VOLTAREN) 1 % GEL [id number] Yes Apply 4 g topically 4 (four) times daily. Chase Picket, MD Taking Active Multiple Informants  furosemide (LASIX) 20 MG tablet [id number] Yes Take  1 tablet (20 mg total) by mouth daily as needed for fluid or edema. [name], MD Taking Active   glucose blood (ONETOUCH VERIO) test strip [id number] Yes USE 1 STRIP TO CHECK GLUCOSE ONCE DAILY AS DIRECTED [name], MD Taking Active Multiple Informants  levothyroxine (EUTHYROX) 25 MCG tablet [id number] Yes Take  1 tablet (25 mcg total) by mouth daily before breakfast. Farrel Conners, MD Taking Active   metoprolol succinate (TOPROL-XL) 50 MG 24 hr tablet [id number]  Take 1 tablet (50 mg total) by mouth every evening. Richardson Dopp T, PA-C  Expired 05/21/21 9326   OneTouch Delica Lancets 71I MISC [id number] Yes USE 1  TO CHECK GLUCOSE ONCE DAILY Koberlein, Junell C, MD Taking Active Multiple Informants  pantoprazole (PROTONIX) 40 MG tablet [id number] Yes Take 1 tablet (40 mg total) by mouth daily. [name], MD Taking Active   sacubitril-valsartan (ENTRESTO) 24-26 [place] [id number] Yes Take 1 tablet by mouth 2 (two) times daily. Farrel Conners, MD Taking Active   spironolactone (ALDACTONE) 25 MG tablet [id number] Yes Take 0.5 tablets (12.5 mg total) by mouth daily. Richardson Dopp T, PA-C Taking Active   vitamin B-12 (CYANOCOBALAMIN) 1000 MCG tablet [id number] Yes Take 1 tablet (1,000 mcg total) by mouth daily. [name], MD Taking Active Multiple Informants  vitamin C (ASCORBIC ACID) 500 MG tablet [id number] Yes Take 500 mg by mouth daily.  [provider] Taking Active Multiple Informants  warfarin (COUMADIN) 5 MG tablet [id number] Yes TAKE 1-2 TABLETS BY MOUTH ONCE DAILY OR AS DIRECTED  BY  COUMADIN  CLINIC Fay Records, MD Taking Active             Patient Active Problem List   Diagnosis Date Noted   Elevated troponin    Dilated cardiomyopathy ([place])    Ischemic cardiomyopathy    PAF (paroxysmal atrial fibrillation) (HCC)    Non-ST elevation (NSTEMI) myocardial infarction ([place]) 12/25/2020   Thrombocytopenia ([place]) 04/23/2020   CAD (coronary artery  disease) 06/24/2019   Hypertension 04/25/2019   Atypical chest pain 12/15/2018   Chronic kidney disease, stage 3a ([place]) 12/15/2018   Frequent falls 05/21/2018   HFimpEF (heart failure with improved ejection fraction) ([place]) 03/15/2017   Anticoagulated on warfarin    Pulmonary hypertension ([place]) 07/26/2016   Neuropathy 02/12/2015   History of pulmonary embolism 06/10/2013   Hypothyroid 06/06/2013   Diabetes mellitus with neurological manifestations, controlled ([place]) 06/06/2013   Hyperlipidemia 02/25/2013   DM2 (diabetes mellitus, type 2) ([place]) 07/23/2012    Immunization History  Administered Date(s) Administered   Fluad Quad(high Dose 65+) 09/12/2018, 10/30/2019   Influenza Split 10/03/2012   Influenza Whole 10/04/2011   Influenza, High Dose Seasonal PF 11/12/2014, 10/21/2015, 10/20/2016, 09/19/2017   Influenza,inj,Quad PF,6+ Mos 11/07/2013   Influenza-Unspecified 10/03/2016   PFIZER(Purple Top)SARS-COV-2 Vaccination 02/26/2019, 03/09/2019, 12/20/2019   PNEUMOCOCCAL CONJUGATE-20 02/26/2021   Pneumococcal Conjugate-13 04/07/2014, 10/20/2016   Pneumococcal-Unspecified 10/04/2011   Tdap 04/07/2014   Zoster, Live 07/23/2015   Patient is getting PAP for [place] and Entresto and has been receiving both medications regularly. She does not recall getting a letter in the mail about her renewal for [place].  Patient has not been writing her numbers down for BP. She has an aid coming coming 3 times a week and writes in her book and then the nurse comes once a week and she looks at the readings that the aid checks. She reports her BP readings must be "alright" because no one has said anything to her otherwise.   Conditions to be addressed/monitored:  Hypertension, Hyperlipidemia, Diabetes, Heart Failure, Chronic Kidney Disease, Hypothyroidism, Osteoarthritis, and GI bleed and neuropathy  Conditions addressed this visit: Hypertension, diabetes, heart failure  Care Plan : [place]  Updates made by [name], [place] since 12/13/2021 12:00 AM  Problem: Problem: Hypertension, Hyperlipidemia, Diabetes, Heart Failure, Chronic Kidney Disease, Hypothyroidism, Osteoarthritis, and GI bleed and neuropathy      Long-Range Goal: Patient-Specific Goal   Start Date: 06/18/2020  Expected End Date: 06/18/2021  Recent Progress: On track  Priority: High  Note:   Current Barriers:  Unable to independently afford treatment regimen Unable to independently monitor therapeutic efficacy  Pharmacist Clinical Goal(s):  Patient will verbalize ability to afford treatment regimen achieve adherence to monitoring guidelines and medication adherence to achieve therapeutic efficacy maintain control of blood pressure as evidenced by home blood pressure readings  through collaboration with PharmD and provider.   Interventions: 1:1 collaboration with Farrel Conners, MD regarding development and update of comprehensive plan of care as evidenced by provider attestation and co-signature Inter-disciplinary care team collaboration (see longitudinal plan of care) Comprehensive medication review performed; medication list updated in electronic medical record  Hypertension (BP goal <140/90) -Controlled -Current treatment: Metoprolol succinate 50 mg 1 tablet once daily - Appropriate, Effective, Safe, Accessible Entresto 25-26 mg 1 tablet twice daily -   Appropriate, Effective, Safe, Query accessible Spironolactone 25 mg 1/2 tablet daily -  Appropriate, Effective, Safe, Accessible -Medications previously tried: amlodipine, lisinopril  -Current home readings: 142/69, 132/75 HR 60, 135/65 HR 50, 137/65 HR 52, 142/69 HR 69,  143/81 HR 60 (checking a couple days a week) -Current dietary habits: limits salt intake; daughters bring her supper every day; eats out sometimes -Current exercise habits: PT exercises every day (doesn't go down the steps every day -Denies hypotensive/hypertensive  symptoms -Educated on Daily salt intake goal < 2300 mg; Importance of home blood pressure monitoring; Proper BP monitoring technique; Symptoms of hypotension and importance of maintaining adequate hydration; -Counseled to monitor BP at home daily, document, and provide log at future appointments -Counseled on diet and exercise extensively Recommended to continue current medication  Hyperlipidemia: (LDL goal < 70) -Controlled -Current treatment: Atorvastatin 40 mg 1 tablet at bedtime - Appropriate, Effective, Safe, Accessible -Medications previously tried: rosuvastatin (side effects), simvastatin (not effective) -Current dietary patterns: eats a lot of fish and only some red meat; tries to boil instead of frying and tries not to cook with oil but does use vegetable oil - recommended olive or canola -Current exercise habits: PT exercises every day -Educated on Cholesterol goals;  Benefits of statin for ASCVD risk reduction; Importance of limiting foods high in cholesterol; -Counseled on diet and exercise extensively Recommended to continue current medication  Diabetes (A1c goal <7%) -Controlled -Current medications: Farxiga 10 mg 1 tablet daily  - Appropriate, Effective, Safe, Accessible -Medications previously tried: metformin (not needed) -Current home glucose readings fasting glucose: 117, 121, 124 post prandial glucose: does not check -Denies hypoglycemic/hyperglycemic symptoms -Current meal patterns:  breakfast: did not discuss lunch: did not discuss  dinner: did not discuss snacks: did not discuss drinks: did not discuss -Current exercise: PT exercises every day -Educated on A1c and blood sugar goals; Prevention and management of hypoglycemic episodes; Benefits of routine self-monitoring of blood sugar; Carbohydrate counting and/or plate method -Counseled to check feet daily and get yearly eye exams -Counseled on diet and exercise extensively Recommended to continue  current medication Recommended for patient to contact eye doctor to get report from last eye exam  Heart Failure (Goal: manage symptoms and prevent exacerbations) -Controlled -Last ejection fraction: 45-50% (Date: 04/23/20) -HF type: Diastolic -NYHA Class: II (slight limitation of activity) -AHA HF Stage: B (Heart disease present - no symptoms present) -Current treatment: Furosemide 20 mg  daily as needed - Appropriate, Effective, Safe, Accessible Metoprolol succinate 50 mg 1 tablet daily - Appropriate, Effective, Safe, Accessible Entresto 25-26 mg 1 tablet twice daily - Appropriate, Effective, Safe, Accessible Farxiga 10 mg 1 tablet daily - Appropriate, Effective, Safe, Accessible Spironolactone 25 mg 1/2 tablet daily - Appropriate, Effective, Safe, Accessible -Medications previously tried: none  -Current home BP/HR readings: refer to above -Current dietary habits: limiting salt intake -Current exercise habits: PT exercises -Educated on Benefits of medications for managing symptoms and prolonging life Importance of weighing daily; if you gain more than 3 pounds in one day or 5 pounds in one week, call cardiologist Importance of blood pressure control -Counseled on diet and exercise extensively Recommended to continue current medication Assessed patient finances. Renewed Entresto PAP.  Hypothyroidism (Goal: TSH  0.35-4.5) -Controlled -Current treatment  Euthyrox 25 mcg 1 tablet before breakfast - Appropriate, Effective, Safe, Accessible -Medications previously tried: none  -Recommended to continue current medication Counseled on importance of taking this medication on an empty stomach separate from all multivitamins and supplements.  Stomach protection (Goal: protect stomach after GI bleed) -Controlled -Current treatment  Pantoprazole 40 mg 1 tablet daily - Appropriate, Effective, Safe, Accessible -Medications previously tried: none  - Plan to reassess need at follow  up.  Osteoarthritis (Goal: minimize pain) -Controlled -Current treatment  Voltaren gel 1% apply as needed - Appropriate, Effective, Safe, Accessible Aspercreme as needed - Appropriate, Effective, Safe, Accessible Tylenol 500 mg 1 tablet as needed - Appropriate, Effective, Safe, Accessible -Medications previously tried: none  -Recommended to continue current medication  History of PE (Goal: prevent blood clots) -Controlled -Current treatment  Warfarin 5 mg as directed by coumadin clinic - Appropriate, Effective, Safe, Accessible -Medications previously tried: none  -Recommended to continue current medication Counseled on maintaining consistent intake of greens from week to week   Health Maintenance -Vaccine gaps: shingrix, COVID booster, influenza -Current therapy:  Vitamin C 500 mg daily Vitamin B12 1000 mcg daily -Educated on Cost vs benefit of each product must be carefully weighed by individual consumer -Patient is satisfied with current therapy and denies issues -Recommended to continue current medication  Patient Goals/Self-Care Activities Patient will:  - take medications as prescribed check glucose daily, document, and provide at future appointments check blood pressure daily, document, and provide at future appointments weigh daily, and contact provider if weight gain of > 3 lbs in one day or > 5 lbs in one week  Follow Up Plan: The care management team will reach out to the patient again over the next 60 days.        Medication Assistance:  Delene Loll and Farxiga obtained through [employer] [place] medication assistance program.  Enrollment ends 01/02/22  Compliance/Adherence/Medication fill history: Care Gaps: Shingrix, eye exam (went in April), influenza vaccine, COVID booster, urine microalbumin Last BP - 136/80 and 11/29/2021 Last A1C - 6.7 on 08/19/2021  Star-Rating Drugs: Atorvastatin 40 mg - last filled 05/18/2021 90 DS at [facility] 10 mg - last  filled 05/29/2021 30 DS at [facility]   Both medications verified with pharm tech at [facility]  Patient's preferred pharmacy is:  [place] ([place]), [place] - [address] [place]) [address] [address] Phone: [phone] Fax: [phone]   Uses pill box? Yes Pt endorses 99% compliance  We discussed: Benefits of medication synchronization, packaging and delivery as well as enhanced pharmacist oversight with [employer]. Patient decided to: Continue current medication management strategy  Care  Plan and Follow Up Patient Decision:  Patient agrees to Care Plan and Follow-up.  Plan: Telephone follow up appointment with care management team member scheduled for:  4 days  Jeni Salles, PharmD, [place] Pharmacist [place] at [employer] [phone]

## 2021-12-13 NOTE — Patient Instructions (Addendum)
Call your eye doctor to ask for your report from your appointment in April - fax to [phone] Go to your pharmacy to get the flu shot and updated COVID booster   Maddie Gaylord Shih, PharmD, [facility] Clinical Pharmacist [facility] at [place] [phone]

## 2021-12-14 ENCOUNTER — Other Ambulatory Visit: Payer: Self-pay | Admitting: Family Medicine

## 2021-12-14 DIAGNOSIS — I420 Dilated cardiomyopathy: Secondary | ICD-10-CM

## 2021-12-14 MED ORDER — ENTRESTO 24-26 MG PO TABS: 1.0000 | ORAL_TABLET | Freq: Two times a day (BID) | ORAL | 3 refills | Status: DC

## 2021-12-15 ENCOUNTER — Ambulatory Visit: Payer: Medicare Other | Attending: Internal Medicine | Admitting: Internal Medicine

## 2021-12-15 ENCOUNTER — Encounter: Payer: Self-pay | Admitting: Internal Medicine

## 2021-12-15 VITALS — BP 134/80 | HR 71 | Ht 67.0 in | Wt 198.8 lb

## 2021-12-15 DIAGNOSIS — Z5181 Encounter for therapeutic drug level monitoring: Secondary | ICD-10-CM | POA: Diagnosis not present

## 2021-12-15 DIAGNOSIS — I480 Paroxysmal atrial fibrillation: Secondary | ICD-10-CM

## 2021-12-15 LAB — PROTIME-INR
INR: 4.3 — ABNORMAL HIGH (ref 0.9–1.2)
Prothrombin Time: 40.4 s — ABNORMAL HIGH (ref 9.1–12.0)

## 2021-12-15 NOTE — Progress Notes (Unsigned)
Cardiology Office Note:    Date:  12/15/2021   ID:  Hannah [name], DOB 07-Nov-1940, MRN [id number]  PCP:  Farrel Conners, MD  [facility] Providers Cardiologist:  Dorris Carnes, MD Cardiology APP:  Sharmon [place]     Referring MD: [name], MD   Patient presents for follow up   Patient Profile: Prior massive pulmonary embolism  Lifelong anticoagulation (Warfarin) (HFrEF) heart failure with reduced ejection fraction  Non-ischemic cardiomyopathy  Previously managed for Diastolic CHF Echocardiogram 10/2018: EF 55-60, Gr 1 DD Echocardiogram 4/22: EF 45-50 Echocardiogram 12/22: EF 30-35, Gr 1 DD, mild MR, mod TR Coronary calcification on CT Myoview 12/2018: Low risk Cath 12/22: no sig CAD (prox to mid RCA 20) Paroxysmal atrial fibrillation  Noted during admx for COVID-19, new onset CHF, chest pain in 12/22 Diabetes mellitus  Hypertension  Hyperlipidemia  Hypothyroidism  Chronic kidney disease  Hx of large bowel stricture, s/p colectomy w colostomy  admx in 12/2017 w L breast hematoma and cellulitis; Hgb dropped from 11.5>>8.8 (INR = 3.48); Warfarin held for 2 weeks admx for Syncope in 10/2018; neg workup (CT, MRI, Echocardiogram); felt to be due to orthostatic hypotension; poor hydration  Hx of LGI bleed (?diverticular) in 04/2020   Prior CV Studies: Cardiac catheterization January 16, 2021 RCA prox to mid 20   Cardiac catheterization 12/30/20 Normal R heart pressures Unsuccessful L heart cath > returned 2021/01/16   Echocardiogram 12/26/20 EF 30-35, ant septum/apex, mid inf-sept AK; ant-lat/ant/inf HK, Gr 1 DD, mild LVH, normal RVSF, RVSP 27.2, small effusion (ant to RV), mild MR, mod TR, AV sclerosis w/o AS   Echocardiogram 04/23/20 Inf HK, EF 45-50, mild LVH, Gr 1 DD   Myoview 12/16/2018 No ischemia or infarction, EF 57; Low Risk    Echocardiogram 10/31/2018 EF 55-60, Gr 1 DD, normal RVSF, mild MAC, mild MR, mild TR    History of Present Illness:    Hannah [name] is a 81 y.o. female with the above problem list.  She was admitted in 12/22 with elevated hs-Trop levels and newly depressed EF (30-35) in the setting of COVID-19 c/b a single episode of AF w RVR.  She had no significant CAD on cath.  She went to the ED in 1/23 with chest pain, shortness of breath and low BP from taking NTG.   Workup was unremarkable.   The pt was last seen in cardiology in Feb 2023 b [facility] [place]     Since seen she has done OK     Breathing is stable   She denies CP    Past Medical History:  Diagnosis Date   Acute massive pulmonary embolism ([place]) 07/13/2012   Massive PE w/ PEA arrest 07/13/12 >TNK >IVC filter >discharged on comadin     Anticoagulated on warfarin    Chronic diastolic CHF (congestive heart failure) ([place]) 03/15/2017   Chronic kidney disease, stage 3a ([place]) 12/15/2018   Diabetes mellitus, type 2 ([place]) 07/15/2012   with peripheral neuropathy   DM2 (diabetes mellitus, type 2) ([place]) 07/23/2012   HFrEF (heart failure with reduced ejection fraction) ([place]) 03/15/2017   Non-ischemic cardiomyopathy // Cardiac catheterization January 16, 2021:  RCA prox to mid 20 // Echocardiogram 12/26/20:  EF 30-35, ant septum/apex, mid inf-sept AK; ant-lat/ant/inf HK, Gr 1 DD, mild LVH, normal RVSF, RVSP 27.2, small effusion (ant to RV), mild MR, mod TR, AV sclerosis w/o AS   Hyperlipemia 07/14/2012   Hyperlipidemia 02/25/2013   Hypertension 07/14/2012   Hypothyroid 06/06/2013   Large  bowel stricture (HCC)    s/p colectomy in 2016   Neuropathy 02/12/2015   Osteoarthritis    s/p hip and knee replacements   PAF (paroxysmal atrial fibrillation) ([place])    Pulmonary hypertension ([place]) 07/26/2016   Syncope 10/2018   Thyroid disease    Current Medications: Current Meds  Medication Sig   acetaminophen (TYLENOL) 500 MG tablet Take 500 mg by mouth every 6 (six) hours as needed for mild pain.    atorvastatin (LIPITOR) 40 MG tablet Take 1 tablet by mouth once daily   Blood Glucose Monitoring Suppl  (ONETOUCH VERIO) w/Device KIT 1 each by Does not apply route as directed.   dapagliflozin propanediol (FARXIGA) 10 MG TABS tablet Take 1 tablet (10 mg total) by mouth daily before breakfast.   diclofenac Sodium (VOLTAREN) 1 % GEL Apply 4 g topically 4 (four) times daily.   furosemide (LASIX) 20 MG tablet Take 1 tablet (20 mg total) by mouth daily as needed for fluid or edema.   glucose blood (ONETOUCH VERIO) test strip USE 1 STRIP TO CHECK GLUCOSE ONCE DAILY AS DIRECTED   levothyroxine (EUTHYROX) 25 MCG tablet Take 1 tablet (25 mcg total) by mouth daily before breakfast.   OneTouch Delica Lancets 87F MISC USE 1  TO CHECK GLUCOSE ONCE DAILY   pantoprazole (PROTONIX) 40 MG tablet Take 1 tablet (40 mg total) by mouth daily.   sacubitril-valsartan (ENTRESTO) 24-26 MG Take 1 tablet by mouth 2 (two) times daily.   spironolactone (ALDACTONE) 25 MG tablet Take 0.5 tablets (12.5 mg total) by mouth daily.   vitamin B-12 (CYANOCOBALAMIN) 1000 MCG tablet Take 1 tablet (1,000 mcg total) by mouth daily.   vitamin C (ASCORBIC ACID) 500 MG tablet Take 500 mg by mouth daily.    warfarin (COUMADIN) 5 MG tablet TAKE 1-2 TABLETS BY MOUTH ONCE DAILY OR AS DIRECTED  BY  COUMADIN  CLINIC    Allergies:   Augmentin [amoxicillin-pot clavulanate], Tranxene [clorazepate], Crestor [rosuvastatin], and Penicillins   Social History   Tobacco Use   Smoking status: Former    Packs/day: 1.00    Years: 10.00    Total pack years: 10.00    Types: Cigarettes    Quit date: 01/04/1968    Years since quitting: 53.9   Smokeless tobacco: Never  Vaping Use   Vaping Use: Never used  Substance Use Topics   Alcohol use: No   Drug use: No    Family Hx: The patient's family history includes Breast cancer in her mother.  ROS see HPI  EKGs/Labs/Other Test Reviewed:    EKG:  EKG is   not ordered today     Recent Labs: 12/28/2020: Magnesium 2.0 02/15/2021: ALT 8 08/19/2021: TSH 2.00 11/23/2021: B Natriuretic Peptide 109.4; BUN  28; Creatinine, Ser 1.72; Hemoglobin 13.0; Platelets 283; Potassium 4.4; Sodium 140   Recent Lipid Panel Recent Labs    02/15/21 1301  CHOL 150  TRIG 80.0  HDL 67.50  VLDL 16.0  LDLCALC 66      Risk Assessment/Calculations:    CHA2DS2-VASc Score = 9   This indicates a 12.2% annual risk of stroke. The patient's score is based upon: CHF History: 1 HTN History: 1 Diabetes History: 1 Stroke History: 2 Vascular Disease History: 1 Age Score: 2 Gender Score: 1   Physical Exam:    VS:  BP 134/80   Pulse 71   Ht _0  (1.702 m)   Wt 198 lb 12.8 oz (90.2 kg)   SpO2 96%  BMI 31.14 kg/m     Wt Readings from Last 3 Encounters:  12/15/21 198 lb 12.8 oz (90.2 kg)  11/29/21 199 lb (90.3 kg)  11/23/21 195 lb 5.2 oz (88.6 kg)    Pt in NAD Neck   JVP is normal     Lungs are CTA  Cardiac RRR   No S3   No signficant murmurs  Abd is supple    Ext with trivial edema         ASSESSMENT AND PLAN  HFrEF (heart failure with reduced ejection fraction) (HCC) Non-ischemic cardiomyopathy.  EF 30-35.  NYHA II-IIb.  Volume status stable.   Continue on Entresto 24/26 bid, dapagliflozin 10, Toprol XL 50, aldactone. Will get follow up echo this summer    PAF (paroxysmal atrial fibrillation) ([place]) Clinically in SR Keep on lifelong coumadin b/c of prior hx of massive pulmonary embolism.     CAD (coronary artery disease) Minimal nonobstructive CAD at cath     Hypertension BP is controlled      Hyperlipidemia Continue atorvastatin 40 mg daily.   LDL is 66          Medication Adjustments/Labs and Tests Ordered: Current medicines are reviewed at length with the patient today.  Concerns regarding medicines are outlined above.  Tests Ordered: No orders of the defined types were placed in this encounter.  Medication Changes: No orders of the defined types were placed in this encounter.  Signed, Dorris Carnes, MD  12/15/2021 3:31 PM    [facility] [address], [place], [place]  [address] Phone: [phone]; Fax: [phone]

## 2021-12-15 NOTE — Patient Instructions (Signed)
Medication Instructions:   *If you need a refill on your cardiac medications before your next appointment, please call your pharmacy*   Lab Work: INR If you have labs (blood work) drawn today and your tests are completely normal, you will receive your results only by: MyChart Message (if you have MyChart) OR A paper copy in the mail If you have any lab test that is abnormal or we need to change your treatment, we will call you to review the results.   Testing/Procedures:    Follow-Up: At [facility], you and your health needs are our priority.  As part of our continuing mission to provide you with exceptional heart care, we have created designated Provider Care Teams.  These Care Teams include your primary Cardiologist (physician) and Advanced Practice Providers (APPs -  Physician Assistants and Nurse Practitioners) who all work together to provide you with the care you need, when you need it.  We recommend signing up for the patient portal called "MyChart".  Sign up information is provided on this After Visit Summary.  MyChart is used to connect with patients for Virtual Visits (Telemedicine).  Patients are able to view lab/test results, encounter notes, upcoming appointments, etc.  Non-urgent messages can be sent to your provider as well.   To learn more about what you can do with MyChart, go to [email or web address].    Your next appointment:   6 month(s)  The format for your next appointment:   In Person  Provider:   Dietrich Pates, MD     Other Instructions   Important Information About Sugar

## 2021-12-22 ENCOUNTER — Telehealth: Payer: Self-pay | Admitting: Pharmacist

## 2021-12-22 NOTE — Chronic Care Management (AMB) (Unsigned)
    Chronic Care Management Pharmacy Assistant   Name: Hannah [name]  MRN: [id number] DOB: [birth year]  Reason for Encounter: Follow up patient assistance  [name] - Spoke with Ivin Booty at [employer], patient has been approved through 01/03/23. Medication in process and will ship to patients home address on 12/24/21 scheduled to arrive 12/29/21.   Marcelline Deist - Spoke with Kandee Keen at [employer], patient is approved through 01/03/23.  Shipment pending a new prescription to be faxed to [employer].  12/23/21 Spoke with Nettie Elm at [facility], requested prescription for Farxiga to be faxed to [employer] at [phone].   [name] [facility]  Clinical Pharmacist Assistant [phone]

## 2021-12-23 ENCOUNTER — Telehealth: Payer: Self-pay | Admitting: Internal Medicine

## 2021-12-23 MED ORDER — DAPAGLIFLOZIN PROPANEDIOL 10 MG PO TABS: 10.0000 mg | ORAL_TABLET | Freq: Every day | ORAL | 3 refills | Status: DC

## 2021-12-23 NOTE — Telephone Encounter (Signed)
Received email. Had form signed. Faxed to number provided.

## 2021-12-23 NOTE — Telephone Encounter (Signed)
**Note De-Identified  Obfuscation** I have e-scribed a Farxiga 10 mg refill to [facility] (pharmacy for [employer]) for #90 with 3 refills.  Since it is unclear if the pt has applied through [facility] and [facility] for [place] assistance, I have completed a providers page of a [employer] pt asst application with our DOD, Dr Fabio Bering information in Dr Tenny Craw' absence in case it is needed.  I have e-mailed the completed providers page to our Triage team lead so she can obtain his signature, date it, and to fax to [facility] at the fax number underlined at the top of the page.

## 2021-12-23 NOTE — Telephone Encounter (Signed)
New [place]:    Janie form [place] at [employer]. She says the patient need a new prescription for Farxiga for the patient assistance program.      *STAT* If patient is at the pharmacy, call can be transferred to refill team.   1. Which medications need to be refilled? (please list name of each medication and dose if known) new prescription for Farxiga and previous application for Farxiga - sed both of them at the same time  2. Which pharmacy/location (including street and city if local pharmacy) is medication to be sent to? [facility]  Fax#  [phone]  3. Do they need a 30 day or 90 day supply? 90 days and refills

## 2021-12-28 ENCOUNTER — Ambulatory Visit: Payer: Medicare Other | Attending: Cardiovascular Disease

## 2021-12-28 DIAGNOSIS — I2699 Other pulmonary embolism without acute cor pulmonale: Secondary | ICD-10-CM

## 2021-12-28 DIAGNOSIS — Z5181 Encounter for therapeutic drug level monitoring: Secondary | ICD-10-CM

## 2021-12-28 LAB — POCT INR: INR: 5.2 — AB (ref 2.0–3.0)

## 2021-12-28 NOTE — Patient Instructions (Signed)
HOLD today and Wednesday dose then continue taking Warfarin 1/2 tablet daily except for 1 tablet on Wednesdays and Fridays.  Keep a green leafy veggie in your diet 2 times a week.  Recheck INR in 1 week  Coumadin Clinic [phone].

## 2021-12-30 NOTE — Progress Notes (Signed)
Spoke with [name] at [employer], patient approved through 01/03/23, new prescription received, sent to processing dept today, once shipped it will be 10-14 days for shipping, medication will ship to patients home. Patient notified.

## 2021-12-31 DIAGNOSIS — E119 Type 2 diabetes mellitus without complications: Secondary | ICD-10-CM | POA: Diagnosis not present

## 2021-12-31 DIAGNOSIS — K56609 Unspecified intestinal obstruction, unspecified as to partial versus complete obstruction: Secondary | ICD-10-CM | POA: Diagnosis not present

## 2021-12-31 DIAGNOSIS — Z4801 Encounter for change or removal of surgical wound dressing: Secondary | ICD-10-CM | POA: Diagnosis not present

## 2021-12-31 DIAGNOSIS — Z933 Colostomy status: Secondary | ICD-10-CM | POA: Diagnosis not present

## 2022-01-02 DIAGNOSIS — I5020 Unspecified systolic (congestive) heart failure: Secondary | ICD-10-CM

## 2022-01-02 DIAGNOSIS — E1142 Type 2 diabetes mellitus with diabetic polyneuropathy: Secondary | ICD-10-CM

## 2022-01-05 ENCOUNTER — Ambulatory Visit: Payer: Medicare Other | Attending: Internal Medicine | Admitting: *Deleted

## 2022-01-05 DIAGNOSIS — I2699 Other pulmonary embolism without acute cor pulmonale: Secondary | ICD-10-CM

## 2022-01-05 DIAGNOSIS — Z5181 Encounter for therapeutic drug level monitoring: Secondary | ICD-10-CM

## 2022-01-05 LAB — POCT INR: INR: 2.3 (ref 2.0–3.0)

## 2022-01-05 NOTE — Patient Instructions (Signed)
Description   Today take 1.5 tablets of warfarin then continue taking Warfarin 1/2 tablet daily except for 1 tablet on Wednesdays and Fridays. Keep a green leafy veggie in your diet 2 times a week.  Recheck INR in 2 weeks. Coumadin Clinic [phone].

## 2022-01-19 ENCOUNTER — Ambulatory Visit: Payer: Medicare Other | Attending: Internal Medicine | Admitting: *Deleted

## 2022-01-19 DIAGNOSIS — Z5181 Encounter for therapeutic drug level monitoring: Secondary | ICD-10-CM | POA: Diagnosis not present

## 2022-01-19 DIAGNOSIS — I2699 Other pulmonary embolism without acute cor pulmonale: Secondary | ICD-10-CM | POA: Diagnosis not present

## 2022-01-19 LAB — POCT INR: INR: 3 (ref 2.0–3.0)

## 2022-01-19 NOTE — Patient Instructions (Signed)
Description   Continue taking Warfarin 1/2 tablet daily except for 1 tablet on Wednesdays and Fridays.  Keep a green leafy veggie in your diet 2 times a week.  Recheck INR in 3 weeks.  Coumadin Clinic [phone].      

## 2022-02-09 ENCOUNTER — Ambulatory Visit: Payer: Medicare Other | Attending: Cardiovascular Disease

## 2022-02-09 DIAGNOSIS — I2699 Other pulmonary embolism without acute cor pulmonale: Secondary | ICD-10-CM | POA: Diagnosis not present

## 2022-02-09 DIAGNOSIS — Z5181 Encounter for therapeutic drug level monitoring: Secondary | ICD-10-CM

## 2022-02-09 LAB — POCT INR: INR: 4.1 — AB (ref 2.0–3.0)

## 2022-02-09 NOTE — Patient Instructions (Signed)
Description   HOLD today's dose and then continue taking Warfarin 1/2 tablet daily except for 1 tablet on Wednesdays and Fridays. Keep a green leafy veggie in your diet 2 times a week.  Recheck INR in 2 weeks.  Coumadin Clinic [phone].

## 2022-02-10 ENCOUNTER — Emergency Department (HOSPITAL_COMMUNITY): Payer: Medicare Other

## 2022-02-10 ENCOUNTER — Emergency Department (HOSPITAL_COMMUNITY)
Admission: EM | Admit: 2022-02-10 | Discharge: 2022-02-10 | Disposition: A | Discharge status: Home / Self Care | Payer: Medicare Other | Attending: Emergency Medicine | Admitting: Emergency Medicine

## 2022-02-10 ENCOUNTER — Encounter (HOSPITAL_COMMUNITY): Payer: Self-pay

## 2022-02-10 DIAGNOSIS — Z7901 Long term (current) use of anticoagulants: Secondary | ICD-10-CM | POA: Insufficient documentation

## 2022-02-10 DIAGNOSIS — I10 Essential (primary) hypertension: Secondary | ICD-10-CM | POA: Diagnosis not present

## 2022-02-10 DIAGNOSIS — R0902 Hypoxemia: Secondary | ICD-10-CM | POA: Diagnosis not present

## 2022-02-10 DIAGNOSIS — W19XXXA Unspecified fall, initial encounter: Secondary | ICD-10-CM

## 2022-02-10 DIAGNOSIS — I6782 Cerebral ischemia: Secondary | ICD-10-CM | POA: Diagnosis not present

## 2022-02-10 DIAGNOSIS — J320 Chronic maxillary sinusitis: Secondary | ICD-10-CM | POA: Diagnosis not present

## 2022-02-10 DIAGNOSIS — Z043 Encounter for examination and observation following other accident: Secondary | ICD-10-CM | POA: Insufficient documentation

## 2022-02-10 DIAGNOSIS — Z743 Need for continuous supervision: Secondary | ICD-10-CM | POA: Diagnosis not present

## 2022-02-10 DIAGNOSIS — G319 Degenerative disease of nervous system, unspecified: Secondary | ICD-10-CM | POA: Diagnosis not present

## 2022-02-10 DIAGNOSIS — S0990XA Unspecified injury of head, initial encounter: Secondary | ICD-10-CM | POA: Diagnosis not present

## 2022-02-10 DIAGNOSIS — R6889 Other general symptoms and signs: Secondary | ICD-10-CM | POA: Diagnosis not present

## 2022-02-10 LAB — CBC WITH DIFFERENTIAL/PLATELET
Abs Immature Granulocytes: 0.02 10*3/uL (ref 0.00–0.07)
Basophils Absolute: 0.1 10*3/uL (ref 0.0–0.1)
Basophils Relative: 1 %
Eosinophils Absolute: 0.1 10*3/uL (ref 0.0–0.5)
Eosinophils Relative: 2 %
HCT: 48.1 % — ABNORMAL HIGH (ref 36.0–46.0)
Hemoglobin: 14.4 g/dL (ref 12.0–15.0)
Immature Granulocytes: 0 %
Lymphocytes Relative: 25 %
Lymphs Abs: 2.1 10*3/uL (ref 0.7–4.0)
MCH: 26.1 pg (ref 26.0–34.0)
MCHC: 29.9 g/dL — ABNORMAL LOW (ref 30.0–36.0)
MCV: 87.1 fL (ref 80.0–100.0)
Monocytes Absolute: 0.4 10*3/uL (ref 0.1–1.0)
Monocytes Relative: 5 %
Neutro Abs: 5.5 10*3/uL (ref 1.7–7.7)
Neutrophils Relative %: 67 %
Platelets: 279 10*3/uL (ref 150–400)
RBC: 5.52 MIL/uL — ABNORMAL HIGH (ref 3.87–5.11)
RDW: 21 % — ABNORMAL HIGH (ref 11.5–15.5)
WBC: 8.2 10*3/uL (ref 4.0–10.5)
nRBC: 0 % (ref 0.0–0.2)

## 2022-02-10 LAB — BASIC METABOLIC PANEL
Anion gap: 9 (ref 5–15)
BUN: 13 mg/dL (ref 8–23)
CO2: 23 mmol/L (ref 22–32)
Calcium: 8.9 mg/dL (ref 8.9–10.3)
Chloride: 107 mmol/L (ref 98–111)
Creatinine, Ser: 1.44 mg/dL — ABNORMAL HIGH (ref 0.44–1.00)
GFR, Estimated: 37 mL/min — ABNORMAL LOW (ref >60)
Glucose, Bld: 144 mg/dL — ABNORMAL HIGH (ref 70–99)
Potassium: 4.3 mmol/L (ref 3.5–5.1)
Sodium: 139 mmol/L (ref 135–145)

## 2022-02-10 LAB — TYPE AND SCREEN
ABO/RH(D): O POS
Antibody Screen: NEGATIVE

## 2022-02-10 LAB — PROTIME-INR
INR: 2.6 — ABNORMAL HIGH (ref 0.8–1.2)
Prothrombin Time: 27.9 seconds — ABNORMAL HIGH (ref 11.4–15.2)

## 2022-02-10 LAB — CK: Total CK: 61 U/L (ref 38–234)

## 2022-02-10 NOTE — ED Notes (Signed)
ED Provider at bedside. 

## 2022-02-10 NOTE — ED Triage Notes (Signed)
Patient BIB [facility] from home where she lives alone for evaluation of a fall after patient rolled out of bed at approximately midnight last night. Patient denies LOC, is on coumadin, unsure of head injury. Patient denies any complaints at this time but reports laying on the floor until approximately 0600 this morning when she called her family to help her get up. Patient is alert, oriented, and in no apparent distress at this time.

## 2022-02-10 NOTE — ED Notes (Signed)
Returned from CT ,

## 2022-02-10 NOTE — ED Notes (Signed)
Patient resting comfortably. No needs verbalized. Call light within reach.

## 2022-02-10 NOTE — ED Notes (Signed)
Patient was able to ambulate with walker with no difficulties.

## 2022-02-10 NOTE — Progress Notes (Signed)
   02/10/22 0800  Spiritual Encounters  Type of Visit Initial  Care provided to: Patient  Referral source Trauma page  Reason for visit Trauma  OnCall Visit Yes  Spiritual Framework  Presenting Themes Coping tools;Impactful experiences and emotions  Community/Connection Family;Faith community  Interventions  Spiritual Care Interventions Made Reflective listening;Compassionate presence;Prayer;Explored values/beliefs/practices/strengths  Intervention Outcomes  Outcomes Connection to spiritual care;Awareness of support  Spiritual Care Plan  Spiritual Care Issues Still Outstanding No further spiritual care needs at this time (see row info)   Chaplain responded to Trauma 2 page in ED, pt was alert and willing to visit. She has support of family members who live nearby and church. I provided reflective listening, prayer and encouragement.

## 2022-02-10 NOTE — ED Provider Notes (Signed)
[place] Provider Note   CSN: [id number] Arrival date & time: 02/10/22  0730     History  Chief Complaint  Patient presents with   Lytle Michaels    Hannah [name] is a 82 y.o. female.  82 year old female with prior medical history as detailed below presents for evaluation.  Patient reports that she lives at home by herself.  Around midnight last night she accidentally rolled out of her bed.  She landed on a wooden floor.  She denies injury related to the fall.  Patient did not call her family for assistance until approximately 6 AM.  She did not want to bother her family in the middle of the night.  She apparently could not get off the floor by herself.  She reports that she uses a walker at baseline at all times for ambulation assistance.  She denies head injury.  She denies loss conscious.  She denies back pain.  She denies injury related to fall.  She is on Coumadin.  Her INR was checked yesterday and was 4.1.  The history is provided by the patient and medical records.       Home Medications Prior to Admission medications   Medication Sig Start Date End Date Taking? Authorizing Provider  acetaminophen (TYLENOL) 500 MG tablet Take 500 mg by mouth every 6 (six) hours as needed for mild pain.     [provider]  atorvastatin (LIPITOR) 40 MG tablet Take 1 tablet by mouth once daily 05/18/21   Koberlein, Andris Flurry C, MD  Blood Glucose Monitoring Suppl (ONETOUCH VERIO) w/Device KIT 1 each by Does not apply route as directed. 06/07/18   [name], MD  dapagliflozin propanediol (FARXIGA) 10 MG TABS tablet Take 1 tablet (10 mg total) by mouth daily before breakfast. 12/23/21   Fay Records, MD  diclofenac Sodium (VOLTAREN) 1 % GEL Apply 4 g topically 4 (four) times daily. 06/26/19   Lamptey, Myrene Galas, MD  furosemide (LASIX) 20 MG tablet Take 1 tablet (20 mg total) by mouth daily as needed for fluid or edema. 03/10/21   Koberlein, Andris Flurry  C, MD  glucose blood (ONETOUCH VERIO) test strip USE 1 STRIP TO CHECK GLUCOSE ONCE DAILY AS DIRECTED 08/14/20   [name], MD  levothyroxine (EUTHYROX) 25 MCG tablet Take 1 tablet (25 mcg total) by mouth daily before breakfast. 08/19/21   Farrel Conners, MD  metoprolol succinate (TOPROL-XL) 50 MG 24 hr tablet Take 1 tablet (50 mg total) by mouth every evening. 02/17/21 05/21/21  Richardson Dopp T, PA-C  OneTouch Delica Lancets 40J MISC USE 1  TO CHECK GLUCOSE ONCE DAILY 07/02/18   Koberlein, Steele Berg, MD  pantoprazole (PROTONIX) 40 MG tablet Take 1 tablet (40 mg total) by mouth daily. 02/26/21   Koberlein, Steele Berg, MD  sacubitril-valsartan (ENTRESTO) 24-26 MG Take 1 tablet by mouth 2 (two) times daily. 12/14/21   Farrel Conners, MD  spironolactone (ALDACTONE) 25 MG tablet Take 0.5 tablets (12.5 mg total) by mouth daily. 03/23/21 03/18/22  Richardson Dopp T, PA-C  vitamin B-12 (CYANOCOBALAMIN) 1000 MCG tablet Take 1 tablet (1,000 mcg total) by mouth daily. 05/15/20   [name], MD  vitamin C (ASCORBIC ACID) 500 MG tablet Take 500 mg by mouth daily.     [provider]  warfarin (COUMADIN) 5 MG tablet TAKE 1-2 TABLETS BY MOUTH ONCE DAILY OR AS DIRECTED  BY  COUMADIN  CLINIC 11/22/21   Harrington Challenger,  Carmin Muskrat, MD      Allergies    Augmentin [amoxicillin-pot clavulanate], Tranxene [clorazepate], Crestor [rosuvastatin], and Penicillins    Review of Systems   Review of Systems  All other systems reviewed and are negative.   Physical Exam Updated Vital Signs BP (!) 140/100   Pulse 63   Temp 98 F (36.7 C) (Oral)   Resp 16   SpO2 100%  Physical Exam Vitals and nursing note reviewed.  Constitutional:      General: She is not in acute distress.    Appearance: Normal appearance. She is well-developed.  HENT:     Head: Normocephalic and atraumatic.  Eyes:     Conjunctiva/sclera: Conjunctivae normal.     Pupils: Pupils are equal, round, and reactive to light.  Cardiovascular:      Rate and Rhythm: Normal rate and regular rhythm.     Heart sounds: Normal heart sounds.  Pulmonary:     Effort: Pulmonary effort is normal. No respiratory distress.     Breath sounds: Normal breath sounds.  Abdominal:     General: There is no distension.     Palpations: Abdomen is soft.     Tenderness: There is no abdominal tenderness.  Musculoskeletal:        General: No tenderness or deformity. Normal range of motion.     Cervical back: Normal range of motion and neck supple.  Skin:    General: Skin is warm and dry.  Neurological:     General: No focal deficit present.     Mental Status: She is alert and oriented to person, place, and time. Mental status is at baseline.     ED Results / Procedures / Treatments   Labs (all labs ordered are listed, but only abnormal results are displayed) Labs Reviewed  PROTIME-INR  CBC WITH DIFFERENTIAL/PLATELET  BASIC METABOLIC PANEL  CK  TYPE AND SCREEN    EKG EKG Interpretation  Date/Time:  Thursday February 10 2022 07:55:24 EST Ventricular Rate:  76 PR Interval:  157 QRS Duration: 130 QT Interval:  381 QTC Calculation: 429 R Axis:   -72 Text Interpretation: Sinus rhythm Nonspecific IVCD with LAD Left ventricular hypertrophy Confirmed by [name] [phone]) on 02/10/2022 8:00:47 AM  Radiology DG Pelvis Portable  Result Date: 02/10/2022 CLINICAL DATA:  Fall EXAM: PORTABLE PELVIS 1 VIEW COMPARISON:  12/07/2017. FINDINGS: No acute fracture identified. Pelvic ring appears intact patient is status post left total hip arthroplasty. Lumbosacral spondylitic changes identified. IVC filter noted. IMPRESSION: Degenerative changes. Status post left total hip arthroplasty. No acute osseous abnormalities. Electronically Signed   By: Sammie Bench M.D.   On: 02/10/2022 08:02   DG Chest Port 1 View  Result Date: 02/10/2022 CLINICAL DATA:  fall EXAM: PORTABLE CHEST - 1 VIEW COMPARISON:  11/23/2021. FINDINGS: Cardiac silhouette is  unremarkable. No pneumothorax or pleural effusion. The lungs are clear. Aorta is calcified. The visualized skeletal structures are unremarkable. IMPRESSION: No acute cardiopulmonary process. Electronically Signed   By: Sammie Bench M.D.   On: 02/10/2022 07:59    Procedures Procedures    Medications Ordered in ED Medications - No data to display  ED Course/ Medical Decision Making/ A&P                             Medical Decision Making Amount and/or Complexity of Data Reviewed Labs: ordered. Radiology: ordered.    Medical Screen Complete  This patient presented to the  ED with complaint of fall.  This complaint involves an extensive number of treatment options. The initial differential diagnosis includes, but is not limited to, trauma related to fall  This presentation is: Acute, Self-Limited, Previously Undiagnosed, Uncertain Prognosis, Complicated, Systemic Symptoms, and Threat to Life/Bodily Function  Patient reports fall from bed to floor last night.  Patient without complaint of significant injury or pain.  Workup is without evidence of significant injury related to fall.  Patient is comfortable at time of discharge.  She desires discharge.  She and her family understand need for close outpatient follow-up.  Strict return precautions given and understood.  Additional history obtained:  External records from outside sources obtained and reviewed including prior ED visits and prior Inpatient records.    Lab Tests:  I ordered and personally interpreted labs.  The pertinent results include: CBC, BMP, INR   Imaging Studies ordered:  I ordered imaging studies including CT head, plain films of chest and pelvis I independently visualized and interpreted obtained imaging which showed NAD I agree with the radiologist interpretation.   Cardiac Monitoring:  The patient was maintained on a cardiac monitor.  I personally viewed and interpreted the cardiac monitor  which showed an underlying rhythm of: NSR   Problem List / ED Course:  Fall   Reevaluation:  After the interventions noted above, I reevaluated the patient and found that they have: improved   Disposition:  After consideration of the diagnostic results and the patients response to treatment, I feel that the patent would benefit from close outpatient follow-up.          Final Clinical Impression(s) / ED Diagnoses Final diagnoses:  Fall, initial encounter    Rx / DC Orders ED Discharge Orders     None         Valarie Merino, MD 02/10/22 1013

## 2022-02-10 NOTE — Discharge Instructions (Signed)
Return for any problem.  ?

## 2022-02-15 ENCOUNTER — Telehealth: Payer: Self-pay

## 2022-02-15 NOTE — Progress Notes (Signed)
Care Management & Coordination Services Pharmacy Team  Reason for Encounter: Hypertension  Contacted patient to discuss hypertension disease state. Spoke with patient on 02/15/2022   Current antihypertensive regimen:  Metoprolol 50 mg every evening Entresto 24/26 mg twice daily Spironolactone 25 mg 1/2 tablet daily Patient verbally confirms she is taking the above medications as directed. Yes  How often are you checking your Blood Pressure? 3-5x per week  she checks her blood pressure at various times.  Current home BP readings: patient states she doesn't have her readings written down, she states they are always good around 120/70, she does have it checked weekly by the home health nurse.   Wrist or arm cuff:  wrist cuff  OTC medications including pseudoephedrine or NSAIDs? Patient denies  Any readings above 180/100? Patient denies  What recent interventions/DTPs have been made by any provider to improve Blood Pressure control since last CPP Visit: No recent interventions.   Any recent hospitalizations or ED visits since last visit with CPP? Patient was seen at [facility] ED on 02/10/2022 (3 hours) due to fall.  What diet changes have been made to improve Blood Pressure Control?  Diet changes - lower sodium diet Breakfast - patient will have cereal and banana or peach Lunch - patient will have a variety of salad, vegetables, macaroni or potato. Dinner - patient will have a meal with a meat and vegetable Caffeine intake - patient will have sweet tea or pepsi on occasion Salt intake - patient does not add salt  What exercise is being done to improve your Blood Pressure Control?  Patient will walk with her walker and sitting exercises   Note:  I asked patient about Atorvastatin, she states she has not been taking this medication, she does not recall having any issues with it and does not recall why she stopped taking it.   Adherence Review: Is the patient currently on  ACE/ARB medication? No Does the patient have >5 day gap between last estimated fill dates? No  Star Rating Drugs:  Atorvastatin 40 mg - last filled 05/18/2021 90 DS at [facility] verified with pharm tech Farxiga 10 mg - filled by PAP through 01/03/2023   Care Gaps: AWV - scheduled 06/29/2022 Eye exam - overdue Urine ACR - overdue Flu - overdue  Covid - overdue Foot exam - completed 07/10/2021 Shingrix - postponed  Chart Updates: Recent office visits:  None  Recent consult visits:  12/15/2021 Dorris Carnes MD (cardiology) - Patient was seen for Encounter for therapeutic drug monitoring and PAF. No medication changes.  Hospital visits:  Patient was seen at [facility] ED on 02/10/2022 (3 hours) due to fall. New?Medications Started at [facility] Discharge:?? No medications started Medication Changes at Hospital Discharge: No medication changes Medications Discontinued at Hospital Discharge: No medications discontinued Medications that remain the same after Hospital Discharge:??  -All other medications will remain the same.    Medications: Outpatient Encounter Medications as of 02/15/2022  Medication Sig   acetaminophen (TYLENOL) 500 MG tablet Take 500 mg by mouth every 6 (six) hours as needed for mild pain.    atorvastatin (LIPITOR) 40 MG tablet Take 1 tablet by mouth once daily   Blood Glucose Monitoring Suppl (ONETOUCH VERIO) w/Device KIT 1 each by Does not apply route as directed.   dapagliflozin propanediol (FARXIGA) 10 MG TABS tablet Take 1 tablet (10 mg total) by mouth daily before breakfast.   diclofenac Sodium (VOLTAREN) 1 % GEL Apply 4 g topically 4 (four) times daily.  furosemide (LASIX) 20 MG tablet Take 1 tablet (20 mg total) by mouth daily as needed for fluid or edema.   glucose blood (ONETOUCH VERIO) test strip USE 1 STRIP TO CHECK GLUCOSE ONCE DAILY AS DIRECTED   levothyroxine (EUTHYROX) 25 MCG tablet Take 1 tablet (25 mcg total) by mouth daily before breakfast.    metoprolol succinate (TOPROL-XL) 50 MG 24 hr tablet Take 1 tablet (50 mg total) by mouth every evening.   OneTouch Delica Lancets [id number] MISC USE 1  TO CHECK GLUCOSE ONCE DAILY   pantoprazole (PROTONIX) 40 MG tablet Take 1 tablet (40 mg total) by mouth daily.   sacubitril-valsartan (ENTRESTO) 24-26 MG Take 1 tablet by mouth 2 (two) times daily.   spironolactone (ALDACTONE) 25 MG tablet Take 0.5 tablets (12.5 mg total) by mouth daily.   vitamin B-12 (CYANOCOBALAMIN) 1000 MCG tablet Take 1 tablet (1,000 mcg total) by mouth daily.   vitamin C (ASCORBIC ACID) 500 MG tablet Take 500 mg by mouth daily.    warfarin (COUMADIN) 5 MG tablet TAKE 1-2 TABLETS BY MOUTH ONCE DAILY OR AS DIRECTED  BY  COUMADIN  CLINIC   No facility-administered encounter medications on file as of 02/15/2022.  Fill History:   Dispensed Days Supply Quantity Provider Pharmacy  ATORVASTATIN 40MG   TAB 05/18/2021 90 90 each      Dispensed Days Supply Quantity Provider Pharmacy  FARXIGA 10MG TAB 05/29/2021 30 30 each      Dispensed Days Supply Quantity Provider Pharmacy  FUROSEMIDE 20MG     TAB 06/02/2021 90 90 each      Dispensed Days Supply Quantity Provider Pharmacy  LEVOTHYROXIN 25MCG TAB 12/11/2021 90 90 each      Dispensed Days Supply Quantity Provider Pharmacy  METOPROLOL ER 50MG  TAB 01/14/2022 30 30 each      Dispensed Days Supply Quantity Provider Pharmacy  PANTOPRAZOLE 40MG TAB 12/20/2021 90 90 each      Dispensed Days Supply Quantity Provider Pharmacy  ENTRESTO 24-26 MG PO TABS 07/27/2021 90 180      Dispensed Days Supply Quantity Provider Pharmacy  SPIRONOLACTONE 25MG    TAB 12/06/2021 90 45 each      Dispensed Days Supply Quantity Provider Pharmacy  WARFARIN 5MG        TAB 12/24/2021 90 80 each      Recent Office Vitals: BP Readings from Last 3 Encounters:  02/10/22 (!) 139/57  12/15/21 134/80  12/13/21 132/70   Pulse Readings from Last 3 Encounters:  02/10/22 (!) 52  12/15/21 71  11/29/21 82     Wt Readings from Last 3 Encounters:  12/15/21 198 lb 12.8 oz (90.2 kg)  11/29/21 199 lb (90.3 kg)  11/23/21 195 lb 5.2 oz (88.6 kg)     Kidney Function Lab Results  Component Value Date/Time   CREATININE 1.44 (H) 02/10/2022 08:06 AM   CREATININE 1.72 (H) 11/23/2021 07:08 PM   CREATININE 1.32 (H) 12/03/2019 01:23 PM   CREATININE 1.31 (H) 10/30/2019 11:09 AM   GFR 37.65 (L) 02/15/2021 01:01 PM   GFRNONAA 37 (L) 02/10/2022 08:06 AM   GFRNONAA 39 (L) 12/03/2019 01:23 PM   GFRAA 45 (L) 12/03/2019 01:23 PM       Latest Ref Rng & Units 02/10/2022    8:06 AM 11/23/2021    7:08 PM 06/16/2021    1:15 PM  BMP  Glucose 70 - 99 mg/dL 144  138  86   BUN 8 - 23 mg/dL 13  28  28  Creatinine 0.44 - 1.00 mg/dL 1.44  1.72  1.49   Sodium 135 - 145 mmol/L 139  140  140   Potassium 3.5 - 5.1 mmol/L 4.3  4.4  4.1   Chloride 98 - 111 mmol/L 107  109  113   CO2 22 - 32 mmol/L 23  16  19   $ Calcium 8.9 - 10.3 mg/dL 8.9  9.1  8.8    [place] Pharmacist Assistant [phone]

## 2022-02-15 NOTE — Progress Notes (Addendum)
Care Management & Coordination Services Pharmacy Note  02/15/2022 Name:  Hannah [name] MRN:  [id number] DOB:  11/05/1940  Summary: -Patient has not taken her atorvastatin for several months due to being out and not requesting refills (forgot), refill request sent -Discussed recent fall with patient and remembering to use life alert device -Reports well-controlled sugars at home  Recommendations/Changes made from today's visit: -RESTART atorvastatin 797m (pt was out of refills, refill request sent) -Remember to keep life alert device on at all times in case of fall  Follow up plan: HLD review in 1 month - confirm pt has restarted atorvastatin Pharmacist visit in 3 months   Subjective: Hannah [name] an 82y.o. year old female who is a primary patient of MFarrel Conners MD.  The care coordination team was consulted for assistance with disease management and care coordination needs.    Engaged with patient by telephone for follow up visit.  Recent office visits: None  Recent consult visits: 02/09/22 Hannah Abu RN (Anticoag) INR 4.1. Hold warfarin 2/7. Continue Warfarin 2.564mdaily except for 97m59mn Wed/Fri  Hospital visits: 02/10/[facility], no medication changes   Objective:  Lab Results  Component Value Date   CREATININE 1.44 (H) 02/10/2022   BUN 13 02/10/2022   GFR 37.65 (L) 02/15/2021   EGFR 42 (L) 04/16/2021   GFRNONAA 37 (L) 02/10/2022   GFRAA 45 (L) 12/03/2019   NA 139 02/10/2022   K 4.3 02/10/2022   CALCIUM 8.9 02/10/2022   CO2 23 02/10/2022   GLUCOSE 144 (H) 02/10/2022    Lab Results  Component Value Date/Time   HGBA1C 6.7 (A) 08/19/2021 01:18 PM   HGBA1C 6.3 02/15/2021 01:01 PM   HGBA1C 6.7 (H) 08/19/2020 03:16 PM   GFR 37.65 (L) 02/15/2021 01:01 PM   GFR 53.04 (L) 08/19/2020 03:16 PM   MICROALBUR 4.3 (H) 09/12/2018 03:07 PM   MICROALBUR 6.2 (H) 04/10/2015 09:01 AM    Last diabetic Eye exam:  Lab  Results  Component Value Date/Time   HMDIABEYEEXA No Retinopathy 06/23/2015 12:00 AM    Last diabetic Foot exam: No results found for: "HMDIABFOOTEX"   Lab Results  Component Value Date   CHOL 150 02/15/2021   HDL 67.50 02/15/2021   LDLCALC 66 02/15/2021   TRIG 80.0 02/15/2021   CHOLHDL 2 02/15/2021       Latest Ref Rng & Units 02/15/2021    1:01 PM 01/08/2021    8:15 AM 12/28/2020    1:45 AM  Hepatic Function  Total Protein 6.0 - 8.3 g/dL 7.8  5.8  5.8   Albumin 3.5 - 5.2 g/dL 4.3  2.8  2.6   AST 0 - 37 U/L 16  18  30   $ ALT 0 - 35 U/L 8  13  13   $ Alk Phosphatase 39 - 117 U/L 57  42  42   Total Bilirubin 0.2 - 1.2 mg/dL 0.6  1.0  0.4     Lab Results  Component Value Date/Time   TSH 2.00 08/19/2021 02:02 PM   TSH 2.93 08/19/2020 03:16 PM   FREET4 1.09 02/25/2013 12:23 PM       Latest Ref Rng & Units 02/10/2022    8:06 AM 11/23/2021    7:08 PM 06/16/2021    1:15 PM  CBC  WBC 4.0 - 10.5 K/uL 8.2  8.9  9.4   Hemoglobin 12.0 - 15.0 g/dL 14.4  13.0  12.4   Hematocrit 36.0 - 46.0 % 48.1  41.6  41.8   Platelets 150 - 400 K/uL 279  283  305     Lab Results  Component Value Date/Time   VITAMINB12 1,096 (H) 08/19/2020 03:16 PM   VITAMINB12 312 04/26/2020 06:16 AM    Clinical ASCVD: Yes  The ASCVD Risk score (Arnett DK, et al., 2019) failed to calculate for the following reasons:   The 2019 ASCVD risk score is only valid for ages 34 to 85   The patient has a prior MI or stroke diagnosis        09/13/2021    2:34 PM 06/24/2021    3:26 PM 01/13/2021   11:33 AM  Depression screen PHQ 2/9  Decreased Interest 0 0 0  Down, Depressed, Hopeless 0 0 0  PHQ - 2 Score 0 0 0  Altered sleeping   0  Tired, decreased energy   0  Change in appetite   0  Feeling bad or failure about yourself    3  Suicidal thoughts   0  PHQ-9 Score   3     Social History   Tobacco Use  Smoking Status Former   Packs/day: 1.00   Years: 10.00   Total pack years: 10.00   Types: Cigarettes    Quit date: 01/04/1968   Years since quitting: 54.1  Smokeless Tobacco Never   BP Readings from Last 3 Encounters:  02/10/22 (!) 139/57  12/15/21 134/80  12/13/21 132/70   Pulse Readings from Last 3 Encounters:  02/10/22 (!) 52  12/15/21 71  11/29/21 82   Wt Readings from Last 3 Encounters:  12/15/21 198 lb 12.8 oz (90.2 kg)  11/29/21 199 lb (90.3 kg)  11/23/21 195 lb 5.2 oz (88.6 kg)   BMI Readings from Last 3 Encounters:  12/15/21 31.14 kg/m  11/29/21 31.17 kg/m  11/23/21 30.59 kg/m    Allergies  Allergen Reactions   Augmentin [Amoxicillin-Pot Clavulanate] Itching and Other (See Comments)    Severe vaginal itching   Tranxene [Clorazepate] Itching   Crestor [Rosuvastatin]     Made her sick on stomach, she is able to tolerate the zocor   Penicillins Itching and Rash    Has patient had a PCN reaction causing immediate rash, facial/tongue/throat swelling, SOB or lightheadedness with hypotension: Yes Has patient had a PCN reaction causing severe rash involving mucus membranes or skin necrosis: No Has patient had a PCN reaction that required hospitalization: No Has patient had a PCN reaction occurring within the last 10 years: No If all of the above answers are "NO", then may proceed with Cephalosporin use.     Medications Reviewed Today     Reviewed by Tera Helper, RN (Registered Nurse) on 02/10/22 at [facility] List Status: <None>   Medication Order Taking? Sig Documenting Provider Last Dose Status Informant  acetaminophen (TYLENOL) 500 MG tablet [id number]  Take 500 mg by mouth every 6 (six) hours as needed for mild pain.  [provider]  Active Multiple Informants  atorvastatin (LIPITOR) 40 MG tablet [id number]  Take 1 tablet by mouth once daily Koberlein, Junell C, MD  Active   Blood Glucose Monitoring Suppl (ONETOUCH VERIO) w/Device KIT [id number]  1 each by Does not apply route as directed. [name], MD  Active Multiple Informants  dapagliflozin  propanediol (FARXIGA) 10 MG TABS tablet [id number]  Take 1 tablet (10 mg total) by mouth daily before breakfast. Fay Records, MD  Active   diclofenac Sodium (VOLTAREN) 1 % GEL [id number]  Apply 4 g topically 4 (four) times daily. Chase Picket, MD  Active Multiple Informants  furosemide (LASIX) 20 MG tablet [id number]  Take 1 tablet (20 mg total) by mouth daily as needed for fluid or edema. [name], MD  Active   glucose blood (ONETOUCH VERIO) test strip [id number]  USE 1 STRIP TO CHECK GLUCOSE ONCE DAILY AS DIRECTED Koberlein, Junell C, MD  Active Multiple Informants  levothyroxine (EUTHYROX) 25 MCG tablet [id number]  Take 1 tablet (25 mcg total) by mouth daily before breakfast. Farrel Conners, MD  Active   metoprolol succinate (TOPROL-XL) 50 MG 24 hr tablet [id number]  Take 1 tablet (50 mg total) by mouth every evening. Richardson Dopp T, PA-C  Expired 05/21/21 0000000   OneTouch Delica Lancets [id number] MISC [id number]  USE 1  TO CHECK GLUCOSE ONCE DAILY Koberlein, Junell C, MD  Active Multiple Informants  pantoprazole (PROTONIX) 40 MG tablet [id number]  Take 1 tablet (40 mg total) by mouth daily. [name], MD  Active   sacubitril-valsartan (ENTRESTO) 24-26 MG [id number]  Take 1 tablet by mouth 2 (two) times daily. Farrel Conners, MD  Active   spironolactone (ALDACTONE) 25 MG tablet [id number]  Take 0.5 tablets (12.5 mg total) by mouth daily. Richardson Dopp T, PA-C  Active   vitamin B-12 (CYANOCOBALAMIN) 1000 MCG tablet [id number]  Take 1 tablet (1,000 mcg total) by mouth daily. [name], MD  Active Multiple Informants  vitamin C (ASCORBIC ACID) 500 MG tablet [id number]  Take 500 mg by mouth daily.  [provider]  Active Multiple Informants  warfarin (COUMADIN) 5 MG tablet [id number]  TAKE 1-2 TABLETS BY MOUTH ONCE DAILY OR AS DIRECTED  BY  COUMADIN  CLINIC Fay Records, MD  Active             SDOH:  (Social Determinants of Health) assessments and interventions  performed: Yes SDOH Interventions    Flowsheet Row Chronic Care Management from 12/13/2021 in [facility] at [facility] from 09/13/2021 in [facility] Coordination Clinical Support from 06/24/2021 in [place] [facility] Management from 06/18/2020 in [place] at [place] Interventions -- Intervention Not Indicated Intervention Not Indicated --  Housing Interventions -- Intervention Not Indicated Intervention Not Indicated --  Transportation Interventions -- Ambulatory [id number] Order Intervention Not Indicated Intervention Not Indicated  Financial Strain Interventions Intervention Not Indicated -- Intervention Not Indicated Intervention Not Indicated  Physical Activity Interventions -- -- Intervention Not Indicated --  Stress Interventions -- -- Intervention Not Indicated --  Social Connections Interventions -- -- Intervention Not Indicated --       Medication Assistance:  Delene Loll and Farxiga approved through PAP for 2024  Medication Access: Within the past 30 days, how often has patient missed a dose of medication? None (but no atorvastatin in last 30 days due to lack of refills, pt did not request) Is a pillbox or other method used to improve adherence? Yes  Factors that may affect medication adherence? no barriers identified Are meds synced by current pharmacy? No  Are meds delivered by current pharmacy? No  Does patient experience delays in picking up medications due to transportation concerns? No   [facility] Services Reviewed: Is patient disadvantaged to use [facility] Pharm[facility]?: Yes  Current Rx insurance plan: [facility] Name and location  of Current pharmacy:  [place]), [place] - [address] [place]) [place] [address] Phone: [phone] Fax: [place], [place]. [place] [address] Phone: [phone] Fax: [phone]  [facility] Pharm[facility] services reviewed with patient today?: No  Patient requests to transfer care to [facility] Pharmacy?: No  Reason patient declined to change pharmacies: Not mentioned at this visit  Compliance/Adherence/Medication fill history: Care Gaps: Diabetic eye exam Urine microalbumin Vaccines: Flu/Covid  Star-Rating Drugs: Atorvastatin 44m PDC 2% Entresto 24-288mPDC 45% - getting through PAP Farxiga 1063mDC 0% - getting through PAP   Assessment/Plan   Hyperlipidemia: (LDL goal < 70) -Not ideally controlled -Current treatment: Atorvastatin 35m32mqd Appropriate, Effective, Safe, Accessible -Medications previously tried: Simvastatin  -Current dietary patterns: not discussed -Current exercise habits: does PT exercises nearly everyday -Educated on Benefits of statin for ASCVD risk reduction; Importance of limiting foods high in cholesterol; Exercise goal of 150 minutes per week; -Recommended to continue current medication -Requested refills for atorvastatin, patient was out of refills and has not been taking the last couple of months  Diabetes (A1c goal <7%) -Controlled -Current medications: Farxiga 10mg12mough PAP Appropriate, Effective, Safe, Accessible -Medications previously tried: Glipizide, Metformin -Current home glucose readings No specific readings but reports WNL -Denies hypoglycemic/hyperglycemic symptoms -Current meal patterns:  Not discussed -Current exercise: see above -Educated on A1c and blood sugar goals; Exercise goal of 150 minutes per week; Prevention and management of hypoglycemic episodes; -Counseled to check feet daily and get yearly eye exams -Recommended to continue current medication   [facility] [phone]

## 2022-02-18 ENCOUNTER — Other Ambulatory Visit: Payer: Self-pay | Admitting: Physician Assistant

## 2022-02-18 ENCOUNTER — Ambulatory Visit: Payer: Medicare Other

## 2022-02-22 DIAGNOSIS — Z4801 Encounter for change or removal of surgical wound dressing: Secondary | ICD-10-CM | POA: Diagnosis not present

## 2022-02-22 DIAGNOSIS — E119 Type 2 diabetes mellitus without complications: Secondary | ICD-10-CM | POA: Diagnosis not present

## 2022-02-22 DIAGNOSIS — Z933 Colostomy status: Secondary | ICD-10-CM | POA: Diagnosis not present

## 2022-02-22 DIAGNOSIS — K56609 Unspecified intestinal obstruction, unspecified as to partial versus complete obstruction: Secondary | ICD-10-CM | POA: Diagnosis not present

## 2022-02-23 ENCOUNTER — Ambulatory Visit: Payer: Medicare Other | Attending: Cardiology | Admitting: *Deleted

## 2022-02-23 DIAGNOSIS — I2699 Other pulmonary embolism without acute cor pulmonale: Secondary | ICD-10-CM

## 2022-02-23 DIAGNOSIS — Z5181 Encounter for therapeutic drug level monitoring: Secondary | ICD-10-CM

## 2022-02-23 LAB — POCT INR: INR: 2.7 (ref 2.0–3.0)

## 2022-02-23 NOTE — Patient Instructions (Signed)
Description   Continue taking Warfarin 1/2 tablet daily except for 1 tablet on Wednesdays and Fridays.  Keep a green leafy veggie in your diet 2 times a week.  Recheck INR in 3 weeks.  Coumadin Clinic [phone].

## 2022-03-03 ENCOUNTER — Telehealth: Payer: Self-pay | Admitting: *Deleted

## 2022-03-03 MED ORDER — ATORVASTATIN CALCIUM 40 MG PO TABS: 40.0000 mg | ORAL_TABLET | Freq: Every day | ORAL | 1 refills | Status: DC

## 2022-03-03 NOTE — Telephone Encounter (Signed)
Rx done. 

## 2022-03-03 NOTE — Telephone Encounter (Signed)
-----   Message from Farrel Conners, MD sent at 03/03/2022  1:04 PM EST ----- Ok to refill this for the patient ----- Message ----- From: Agnes Lawrence, CMA Sent: 02/18/2022  10:24 AM EST To: Farrel Conners, MD   ----- Message ----- From: Maren Reamer, [facility] Sent: 02/18/2022  10:14 AM EST To: Rolla Flatten  Pt needs refills for Atorvastatin 62m sent to her [place] pharmacy[place] [address] [place] [address]Phone: [phone]: [phone] Thank you! AL[facility]armacist [phone]

## 2022-03-08 ENCOUNTER — Telehealth: Payer: Self-pay

## 2022-03-08 NOTE — Progress Notes (Signed)
Care Management & Coordination Services Pharmacy Team  Reason for Encounter: Hyperlipidemia / Cholesterol  Contacted patient to discuss hyperlipidemia disease state. Spoke with patient on 03/08/2022   Current antihyperlipidemic regimen:  Atorvastatin 40 mg daily  What recent interventions/DTPs have been made by any provider to improve Cholesterol control since last CPP Visit: RESTART atorvastatin '40mg'$    Has patient restarted Atorvastatin? Spoke with patient, she states she never got a call from [place] and has not picked it up. Patient was [facility], patient agrees and plans to pick it up. Called [facility] and they state it is filled and ready for her to pick up.   Adherence Review: Is the patient currently on STATIN medication? Yes Does the patient have >5 day gap between last estimated fill dates? No  Care Gaps: AWV - scheduled 06/29/2022 Eye exam - completed 04/04/2018 Foot exam - completed 06/10/2021 Shingrix - never done Urine ACR - overdue Flu - overdue Covid - overdue HGA1C - overdue  Star Rating Drugs:  Atorvastatin 40 mg - last filled 03/03/2022 90 DS at [place] 10 mg - filled by PAP through 01/03/2023  Chart Updates: Recent office visits:  None  Recent consult visits:  None  Hospital visits:  None  Medications: Outpatient Encounter Medications as of 03/08/2022  Medication Sig   acetaminophen (TYLENOL) 500 MG tablet Take 500 mg by mouth every 6 (six) hours as needed for mild pain.    atorvastatin (LIPITOR) 40 MG tablet Take 1 tablet (40 mg total) by mouth daily.   Blood Glucose Monitoring Suppl (ONETOUCH VERIO) w/Device KIT 1 each by Does not apply route as directed.   dapagliflozin propanediol (FARXIGA) 10 MG TABS tablet Take 1 tablet (10 mg total) by mouth daily before breakfast.   diclofenac Sodium (VOLTAREN) 1 % GEL Apply 4 g topically 4 (four) times daily.   furosemide (LASIX) 20 MG tablet Take 1 tablet (20 mg total) by mouth  daily as needed for fluid or edema.   glucose blood (ONETOUCH VERIO) test strip USE 1 STRIP TO CHECK GLUCOSE ONCE DAILY AS DIRECTED   levothyroxine (EUTHYROX) 25 MCG tablet Take 1 tablet (25 mcg total) by mouth daily before breakfast.   metoprolol succinate (TOPROL-XL) 50 MG 24 hr tablet Take 1 tablet by mouth in the evening   OneTouch Delica Lancets [id number] MISC USE 1  TO CHECK GLUCOSE ONCE DAILY   pantoprazole (PROTONIX) 40 MG tablet Take 1 tablet (40 mg total) by mouth daily.   sacubitril-valsartan (ENTRESTO) 24-26 MG Take 1 tablet by mouth 2 (two) times daily.   spironolactone (ALDACTONE) 25 MG tablet Take 0.5 tablets (12.5 mg total) by mouth daily.   vitamin B-12 (CYANOCOBALAMIN) 1000 MCG tablet Take 1 tablet (1,000 mcg total) by mouth daily.   vitamin C (ASCORBIC ACID) 500 MG tablet Take 500 mg by mouth daily.    warfarin (COUMADIN) 5 MG tablet TAKE 1-2 TABLETS BY MOUTH ONCE DAILY OR AS DIRECTED  BY  COUMADIN  CLINIC   No facility-administered encounter medications on file as of 03/08/2022.  Fill History:  Dispensed Days Supply Quantity Provider Pharmacy  ATORVASTATIN CALCIUM  40 MG TABS 03/03/2022 90 90 tablet      Dispensed Days Supply Quantity Provider Pharmacy  FARXIGA '10MG'$  TAB 05/29/2021 30 30 each      Dispensed Days Supply Quantity Provider Pharmacy  FUROSEMIDE '20MG'$      TAB 06/02/2021 90 90 each      Dispensed Days Supply Quantity Provider  Pharmacy  LEVOTHYROXIN 25MCG TAB 12/11/2021 90 90 each      Dispensed Days Supply Quantity Provider Pharmacy  METOPROLOL SUCCINATE ER  50 MG TB24 02/18/2022 30 30 tablet      Dispensed Days Supply Quantity Provider Pharmacy  PANTOPRAZOLE '40MG'$  TAB 12/20/2021 90 90 each      Dispensed Days Supply Quantity Provider Pharmacy  ENTRESTO 24-26 MG PO TABS 07/27/2021 90 180      Dispensed Days Supply Quantity Provider Pharmacy  SPIRONOLACTONE '25MG'$     TAB 12/06/2021 90 45 each      Dispensed Days Supply Quantity Provider Pharmacy  WARFARIN '5MG'$          TAB 12/24/2021 90 80 each      Lipid Panel    Component Value Date/Time   CHOL 150 02/15/2021 1301   TRIG 80.0 02/15/2021 1301   HDL 67.50 02/15/2021 1301   LDLCALC 66 02/15/2021 1301   LDLCALC 95 10/30/2019 1109     Kidney Function Lab Results  Component Value Date/Time   CREATININE 1.44 (H) 02/10/2022 08:06 AM   CREATININE 1.72 (H) 11/23/2021 07:08 PM   CREATININE 1.32 (H) 12/03/2019 01:23 PM   CREATININE 1.31 (H) 10/30/2019 11:09 AM   GFR 37.65 (L) 02/15/2021 01:01 PM   GFRNONAA 37 (L) 02/10/2022 08:06 AM   GFRNONAA 39 (L) 12/03/2019 01:23 PM   GFRAA 45 (L) 12/03/2019 01:23 PM   [place] Pharmacist Assistant [phone]

## 2022-03-14 ENCOUNTER — Other Ambulatory Visit: Payer: Self-pay | Admitting: Family Medicine

## 2022-03-14 ENCOUNTER — Other Ambulatory Visit: Payer: Self-pay | Admitting: Physician Assistant

## 2022-03-14 DIAGNOSIS — E039 Hypothyroidism, unspecified: Secondary | ICD-10-CM

## 2022-03-16 ENCOUNTER — Ambulatory Visit: Payer: Medicare Other | Attending: Internal Medicine | Admitting: *Deleted

## 2022-03-16 DIAGNOSIS — Z5181 Encounter for therapeutic drug level monitoring: Secondary | ICD-10-CM

## 2022-03-16 DIAGNOSIS — I2699 Other pulmonary embolism without acute cor pulmonale: Secondary | ICD-10-CM

## 2022-03-16 LAB — POCT INR: INR: 2.4 (ref 2.0–3.0)

## 2022-03-16 NOTE — Patient Instructions (Addendum)
Description   Today take 1.5 tablets of warfarin then continue taking Warfarin 1/2 tablet daily except for 1 tablet on Wednesdays and Fridays. Keep a green leafy veggie in your diet 2 times a week.  Recheck INR in 4 weeks.  Coumadin Clinic [phone].

## 2022-04-13 ENCOUNTER — Ambulatory Visit: Payer: Medicare Other | Attending: Internal Medicine

## 2022-04-13 DIAGNOSIS — I2699 Other pulmonary embolism without acute cor pulmonale: Secondary | ICD-10-CM

## 2022-04-13 DIAGNOSIS — Z5181 Encounter for therapeutic drug level monitoring: Secondary | ICD-10-CM

## 2022-04-13 LAB — POCT INR: INR: 4.9 — AB (ref 2.0–3.0)

## 2022-04-13 NOTE — Patient Instructions (Signed)
Description   Hold today's dose and eat greens and then continue taking Warfarin 1/2 tablet daily except for 1 tablet on Wednesdays and Fridays.  Keep a green leafy veggie in your diet 2 times a week.  Recheck INR in 2 weeks.  [facility] [phone].

## 2022-04-15 ENCOUNTER — Telehealth: Payer: Self-pay | Admitting: Family Medicine

## 2022-04-15 DIAGNOSIS — Z4801 Encounter for change or removal of surgical wound dressing: Secondary | ICD-10-CM | POA: Diagnosis not present

## 2022-04-15 DIAGNOSIS — K56609 Unspecified intestinal obstruction, unspecified as to partial versus complete obstruction: Secondary | ICD-10-CM | POA: Diagnosis not present

## 2022-04-15 DIAGNOSIS — K9419 Other complications of enterostomy: Secondary | ICD-10-CM

## 2022-04-15 DIAGNOSIS — E119 Type 2 diabetes mellitus without complications: Secondary | ICD-10-CM | POA: Diagnosis not present

## 2022-04-15 DIAGNOSIS — Z933 Colostomy status: Secondary | ICD-10-CM | POA: Diagnosis not present

## 2022-04-15 NOTE — Telephone Encounter (Signed)
Ok to place order 

## 2022-04-15 NOTE — Telephone Encounter (Signed)
I called the patient for more information as we need a phone number or fax number for [facility] as it appears supplies were previously by the general surgeon .  Patient gave me the phone number of [phone].  Spoke with Gearldine Bienenstock and she gave me the fax number of [phone].  DME order was entered, signed by PCP and faxed as above.

## 2022-04-15 NOTE — Telephone Encounter (Signed)
Pt's caregiver called to request an order be put in for Pt's Ostomy Bags.  Please send order to: [facility]

## 2022-04-24 ENCOUNTER — Emergency Department (HOSPITAL_COMMUNITY): Payer: Medicare Other

## 2022-04-24 ENCOUNTER — Inpatient Hospital Stay (HOSPITAL_COMMUNITY)
Admission: EM | Admit: 2022-04-24 | Discharge: 2022-04-30 | DRG: 281 | Disposition: A | Discharge status: Home / Self Care | Payer: Medicare Other | Attending: Family Medicine | Admitting: Family Medicine

## 2022-04-24 ENCOUNTER — Other Ambulatory Visit: Payer: Self-pay

## 2022-04-24 DIAGNOSIS — Z888 Allergy status to other drugs, medicaments and biological substances status: Secondary | ICD-10-CM

## 2022-04-24 DIAGNOSIS — Z7901 Long term (current) use of anticoagulants: Secondary | ICD-10-CM

## 2022-04-24 DIAGNOSIS — N1831 Chronic kidney disease, stage 3a: Secondary | ICD-10-CM | POA: Diagnosis not present

## 2022-04-24 DIAGNOSIS — R29898 Other symptoms and signs involving the musculoskeletal system: Secondary | ICD-10-CM | POA: Diagnosis not present

## 2022-04-24 DIAGNOSIS — E1142 Type 2 diabetes mellitus with diabetic polyneuropathy: Secondary | ICD-10-CM | POA: Diagnosis present

## 2022-04-24 DIAGNOSIS — G252 Other specified forms of tremor: Secondary | ICD-10-CM | POA: Diagnosis present

## 2022-04-24 DIAGNOSIS — Z87891 Personal history of nicotine dependence: Secondary | ICD-10-CM

## 2022-04-24 DIAGNOSIS — I5022 Chronic systolic (congestive) heart failure: Secondary | ICD-10-CM | POA: Diagnosis not present

## 2022-04-24 DIAGNOSIS — Z880 Allergy status to penicillin: Secondary | ICD-10-CM | POA: Diagnosis not present

## 2022-04-24 DIAGNOSIS — Z9049 Acquired absence of other specified parts of digestive tract: Secondary | ICD-10-CM

## 2022-04-24 DIAGNOSIS — Z86711 Personal history of pulmonary embolism: Secondary | ICD-10-CM | POA: Diagnosis not present

## 2022-04-24 DIAGNOSIS — Z8674 Personal history of sudden cardiac arrest: Secondary | ICD-10-CM

## 2022-04-24 DIAGNOSIS — G319 Degenerative disease of nervous system, unspecified: Secondary | ICD-10-CM | POA: Diagnosis not present

## 2022-04-24 DIAGNOSIS — Z803 Family history of malignant neoplasm of breast: Secondary | ICD-10-CM

## 2022-04-24 DIAGNOSIS — Z79899 Other long term (current) drug therapy: Secondary | ICD-10-CM

## 2022-04-24 DIAGNOSIS — R2990 Unspecified symptoms and signs involving the nervous system: Principal | ICD-10-CM

## 2022-04-24 DIAGNOSIS — I272 Pulmonary hypertension, unspecified: Secondary | ICD-10-CM | POA: Diagnosis not present

## 2022-04-24 DIAGNOSIS — I5189 Other ill-defined heart diseases: Secondary | ICD-10-CM | POA: Diagnosis not present

## 2022-04-24 DIAGNOSIS — I429 Cardiomyopathy, unspecified: Secondary | ICD-10-CM | POA: Diagnosis not present

## 2022-04-24 DIAGNOSIS — M79604 Pain in right leg: Secondary | ICD-10-CM | POA: Diagnosis not present

## 2022-04-24 DIAGNOSIS — E039 Hypothyroidism, unspecified: Secondary | ICD-10-CM | POA: Diagnosis not present

## 2022-04-24 DIAGNOSIS — I2510 Atherosclerotic heart disease of native coronary artery without angina pectoris: Secondary | ICD-10-CM | POA: Diagnosis present

## 2022-04-24 DIAGNOSIS — I499 Cardiac arrhythmia, unspecified: Secondary | ICD-10-CM | POA: Diagnosis not present

## 2022-04-24 DIAGNOSIS — E1122 Type 2 diabetes mellitus with diabetic chronic kidney disease: Secondary | ICD-10-CM | POA: Diagnosis present

## 2022-04-24 DIAGNOSIS — R7989 Other specified abnormal findings of blood chemistry: Secondary | ICD-10-CM | POA: Diagnosis not present

## 2022-04-24 DIAGNOSIS — E785 Hyperlipidemia, unspecified: Secondary | ICD-10-CM | POA: Diagnosis not present

## 2022-04-24 DIAGNOSIS — I214 Non-ST elevation (NSTEMI) myocardial infarction: Secondary | ICD-10-CM | POA: Diagnosis present

## 2022-04-24 DIAGNOSIS — R531 Weakness: Secondary | ICD-10-CM | POA: Diagnosis not present

## 2022-04-24 DIAGNOSIS — R29818 Other symptoms and signs involving the nervous system: Secondary | ICD-10-CM | POA: Diagnosis not present

## 2022-04-24 DIAGNOSIS — I708 Atherosclerosis of other arteries: Secondary | ICD-10-CM | POA: Diagnosis not present

## 2022-04-24 DIAGNOSIS — Z881 Allergy status to other antibiotic agents status: Secondary | ICD-10-CM

## 2022-04-24 DIAGNOSIS — I10 Essential (primary) hypertension: Secondary | ICD-10-CM | POA: Diagnosis not present

## 2022-04-24 DIAGNOSIS — E7800 Pure hypercholesterolemia, unspecified: Secondary | ICD-10-CM | POA: Diagnosis not present

## 2022-04-24 DIAGNOSIS — I480 Paroxysmal atrial fibrillation: Secondary | ICD-10-CM | POA: Diagnosis present

## 2022-04-24 DIAGNOSIS — I110 Hypertensive heart disease with heart failure: Secondary | ICD-10-CM | POA: Diagnosis present

## 2022-04-24 DIAGNOSIS — Z933 Colostomy status: Secondary | ICD-10-CM | POA: Diagnosis not present

## 2022-04-24 DIAGNOSIS — F419 Anxiety disorder, unspecified: Secondary | ICD-10-CM | POA: Diagnosis present

## 2022-04-24 DIAGNOSIS — I5181 Takotsubo syndrome: Principal | ICD-10-CM | POA: Diagnosis present

## 2022-04-24 DIAGNOSIS — R29810 Facial weakness: Secondary | ICD-10-CM | POA: Diagnosis not present

## 2022-04-24 DIAGNOSIS — Z96642 Presence of left artificial hip joint: Secondary | ICD-10-CM | POA: Diagnosis present

## 2022-04-24 DIAGNOSIS — E038 Other specified hypothyroidism: Secondary | ICD-10-CM | POA: Diagnosis not present

## 2022-04-24 DIAGNOSIS — I428 Other cardiomyopathies: Secondary | ICD-10-CM | POA: Diagnosis not present

## 2022-04-24 DIAGNOSIS — E119 Type 2 diabetes mellitus without complications: Secondary | ICD-10-CM

## 2022-04-24 DIAGNOSIS — I249 Acute ischemic heart disease, unspecified: Secondary | ICD-10-CM | POA: Diagnosis not present

## 2022-04-24 DIAGNOSIS — R609 Edema, unspecified: Secondary | ICD-10-CM | POA: Diagnosis not present

## 2022-04-24 DIAGNOSIS — I447 Left bundle-branch block, unspecified: Secondary | ICD-10-CM | POA: Diagnosis present

## 2022-04-24 DIAGNOSIS — I6523 Occlusion and stenosis of bilateral carotid arteries: Secondary | ICD-10-CM | POA: Diagnosis not present

## 2022-04-24 DIAGNOSIS — Z743 Need for continuous supervision: Secondary | ICD-10-CM | POA: Diagnosis not present

## 2022-04-24 DIAGNOSIS — Z7984 Long term (current) use of oral hypoglycemic drugs: Secondary | ICD-10-CM

## 2022-04-24 LAB — I-STAT CHEM 8, ED
BUN: 35 mg/dL — ABNORMAL HIGH (ref 8–23)
Calcium, Ion: 1.04 mmol/L — ABNORMAL LOW (ref 1.15–1.40)
Chloride: 109 mmol/L (ref 98–111)
Creatinine, Ser: 1.3 mg/dL — ABNORMAL HIGH (ref 0.44–1.00)
Glucose, Bld: 122 mg/dL — ABNORMAL HIGH (ref 70–99)
HCT: 52 % — ABNORMAL HIGH (ref 36.0–46.0)
Hemoglobin: 17.7 g/dL — ABNORMAL HIGH (ref 12.0–15.0)
Potassium: 4.4 mmol/L (ref 3.5–5.1)
Sodium: 139 mmol/L (ref 135–145)
TCO2: 24 mmol/L (ref 22–32)

## 2022-04-24 LAB — CBC
HCT: 50.4 % — ABNORMAL HIGH (ref 36.0–46.0)
Hemoglobin: 15.5 g/dL — ABNORMAL HIGH (ref 12.0–15.0)
MCH: 26.4 pg (ref 26.0–34.0)
MCHC: 30.8 g/dL (ref 30.0–36.0)
MCV: 85.7 fL (ref 80.0–100.0)
Platelets: 261 10*3/uL (ref 150–400)
RBC: 5.88 MIL/uL — ABNORMAL HIGH (ref 3.87–5.11)
RDW: 20 % — ABNORMAL HIGH (ref 11.5–15.5)
WBC: 11.1 10*3/uL — ABNORMAL HIGH (ref 4.0–10.5)
nRBC: 0 % (ref 0.0–0.2)

## 2022-04-24 LAB — DIFFERENTIAL
Abs Immature Granulocytes: 0.05 10*3/uL (ref 0.00–0.07)
Basophils Absolute: 0.1 10*3/uL (ref 0.0–0.1)
Basophils Relative: 1 %
Eosinophils Absolute: 0.1 10*3/uL (ref 0.0–0.5)
Eosinophils Relative: 1 %
Immature Granulocytes: 1 %
Lymphocytes Relative: 21 %
Lymphs Abs: 2.3 10*3/uL (ref 0.7–4.0)
Monocytes Absolute: 0.7 10*3/uL (ref 0.1–1.0)
Monocytes Relative: 6 %
Neutro Abs: 8 10*3/uL — ABNORMAL HIGH (ref 1.7–7.7)
Neutrophils Relative %: 70 %

## 2022-04-24 NOTE — ED Triage Notes (Signed)
Patient BIB EMS for evaluation of mobility issues.  Per EMS patient lives at home by herself and is having increased difficulty with ambulation.  No reports of recent falls.

## 2022-04-24 NOTE — ED Notes (Addendum)
Patient reports weakness in R leg.  Swelling noted to knee.  EMS reports patient was able to bear weight and sit on walker at home. Family was present at time of EMS arrival

## 2022-04-24 NOTE — Consult Note (Addendum)
[facility] Tele[facility]urology Consult Services   Patient Name:   Hannah [name], Hannah [name] Date of Birth:   12-04-1940 Identification Number:   MRN - [id number] Date of Service:   04/24/2022 23:00:38  Diagnosis:       I63.89 - Cerebrovascular accident (CVA) due to other mechanism [facility])  Impression:      82yoF hx of PE, Afib, DVT (on warfarin), CHF, DM2, HLD, hypothyroidism present with right leg weakness. CT head neg for acute finding. On exam patient has intention tremor on RUE, able to reach target, no drift at RLE but unable to lift as high as LLE. Not thrombolytics candidate due to most reliable LKW was > 4.5hr. Exam is not suggestive of LVO.    Recommend admission for stroke workup and permissive HTN for 24hr. Consider patient's history of multiple clotting complication (massive PE and DVT) with relatively mild stroke symptoms, risk of stopping anticoagulation outweighs the benefit. If patient is to be switch to heparin for other reason, recommend avoid heparin bolus from stroke perspective.  Our recommendations are outlined below.  Recommendations:        Stroke/Telemetry Floor       Neuro Checks       Bedside Swallow Eval       DVT Prophylaxis       IV Fluids, Normal Saline       Head of Bed 30 Degrees       Euglycemia and Avoid Hyperthermia (PRN Acetaminophen)  Sign Out:       Discussed with Emergency Department Provider    ------------------------------------------------------------------------------  Advanced Imaging: Advanced Imaging Deferred because:  Exam is not suggestive of LVO   Metrics: Last Known Well: 04/24/2022 13:00:00 [facility] Notification Time: 04/24/2022 23:00:38 Arrival Time: 04/24/2022 21:41:00 Stamp Time: 04/24/2022 23:00:38 Initial Response Time: 04/24/2022 23:06:25 Symptoms: right leg weakness . Initial patient interaction: 04/24/2022 23:09:39 NIHSS Assessment Completed: 04/24/2022 23:22:10 Patient is not a candidate for  Thrombolytic. Thrombolytic Medical Decision: 04/24/2022 23:22:13 Patient was not deemed candidate for Thrombolytic because of following reasons: Last Well Known Above 4.5 Hours.  CT head showed no acute hemorrhage or acute core infarct.  Primary Provider Notified of Diagnostic Impression and Management Plan on: 04/24/2022 23:29:45    ------------------------------------------------------------------------------  History of Present Illness: Patient is a 82 year old Female.  Patient was brought by EMS for symptoms of right leg weakness . At baseline she uses walker to ambulate. Patient went to daughter's house around 1300 and able to go up the stair normally. She said she walks around the house and did not noticed anything unusual but does not remember when was the last time she was at baseline since she got to daughter's house. When she left daughter's house she already felt something was off, felt weak, attribute that to tired from a long day. Grandson drove her back to house and she noticed she was not able to lift up her right leg to go up the stair.     Past Medical History:      Atrial Fibrillation Othere PMH:  PE, Afib, DVT (on warfarin), CHF, DM2, HLD, hypothyroidism  Medications:  Anticoagulant use:  Yes warfarin No Antiplatelet use Reviewed EMR for current medications  Allergies:  Reviewed Description: augmentin, traxene, crestor, pencillin  Social History: Drug Use: No  Family History:  There is no family history of premature cerebrovascular disease pertinent to this consultation  ROS : 14 Points Review of Systems was performed and was negative except mentioned in HPI.  Past Surgical  History: There Is No Surgical History Contributory To Today's Visit There Is Surgical History of:  x    Examination: BP(178/95), Pulse(88), Blood Glucose(168) 1A: Level of Consciousness - Alert; keenly responsive + 0 1B: Ask Month and Age - Both Questions Right + 0 1C:  Blink Eyes & Squeeze Hands - Performs Both Tasks + 0 2: Test Horizontal Extraocular Movements - Normal + 0 3: Test Visual Fields - No Visual Loss + 0 4: Test Facial Palsy (Use Grimace if Obtunded) - Normal symmetry + 0 5A: Test Left Arm Motor Drift - No Drift for 10 Seconds + 0 5B: Test Right Arm Motor Drift - No Drift for 10 Seconds + 0 6A: Test Left Leg Motor Drift - No Drift for 5 Seconds + 0 6B: Test Right Leg Motor Drift - No Drift for 5 Seconds + 0 7: Test Limb Ataxia (FNF/Heel-Shin) - No Ataxia + 0 8: Test Sensation - Normal; No sensory loss + 0 9: Test Language/Aphasia - Normal; No aphasia + 0 10: Test Dysarthria - Normal + 0 11: Test Extinction/Inattention - No abnormality + 0  NIHSS Score: 0  NIHSS Free Text : RLE unable to lift as high as LLE. No drift at RLE.  RUE: intention tremor seen at [facility]. Still able to reach target.  Pre-Morbid Modified Rankin Scale: 2 Points = Slight disability; unable to carry out all previous activities, but able to look after own affairs without assistance  Spoke with : Roxy Horseman, PA  Patient/Family was informed the Neurology Consult would occur via TeleHealth consult by way of interactive audio and video telecommunications and consented to receiving care in this manner.   Patient is being evaluated for possible acute neurologic impairment and high probability of imminent or life-threatening deterioration. I spent total of 35 minutes providing care to this patient, including time for face to face visit via telemedicine, review of medical records, imaging studies and discussion of findings with providers, the patient and/or family.   Dr Larkin Ina   [facility] For Inpatient follow-up with [facility] physician please call [facility] [phone]. This is not an outpatient service. Post hospital discharge, please contact hospital directly.  Please do not communicate with [facility] physicians via secure chat. If you have any  questions, Please contact [facility]. Please call or reconsult our service if there are any clinical or diagnostic changes.

## 2022-04-24 NOTE — Consult Note (Addendum)
2257: Code stroke cart activation. Pt had already gone to CT and come back to room at time of Code Stroke activation. Pt alert, oriented. Reports R Leg weakness. TSRN was given lkwt of 2030 by bedside staff. FSBS 168 with EMS per bedside staff. Bedside nurse drawing labs and establishing PIV access.  2300: Page sent to [facility] 2306: Dr Laurene Footman on camera, report given to Dr. Laurene Footman along with NCCT results 2309: Dr. Laurene Footman on screen with patient.  2326: No TNK due to out of window, LKWT established to be 1300 by Dr. Laurene Footman. No further needs from [facility]. Call disconnected.   NIH: 0, LKWT 1300, MRS: 2

## 2022-04-24 NOTE — ED Provider Notes (Signed)
[facility]-EMERGENCY DEPT [facility] Emergency Department Provider Note MRN:  [id number]  Arrival date & time: 04/25/22     Chief Complaint   Weakness   History of Present Illness   Hannah [name] is a 82 y.o. year-old female presents to the ED with chief complaint of right lower extremity weakness.  Onset was about 8 PM as she was trying to go up the steps she states that her leg would not move.  She reports some associated numbness.  Denies any speech or vision changes.  She states that she has had some mild improvement of the weakness, but is still different than normal.  She denies any injuries.  Denies having any pain.  Denies headache.  History provided by patient.   Review of Systems  Pertinent positive and negative review of systems noted in HPI.    Physical Exam   Vitals:   04/24/22 2300 04/24/22 2315  BP: (!) 187/91 (!) 183/91  Pulse: 99 (!) 109  Resp: (!) 30 18  Temp:    SpO2: 100% 98%    CONSTITUTIONAL:  non toxic-appearing, NAD NEURO:  Alert and oriented x 3, CN 3-12 grossly intact, decreased sensation in RLE EYES:  eyes equal and reactive ENT/NECK:  Supple, no stridor  CARDIO:  normal rate, regular rhythm, appears well-perfused  PULM:  No respiratory distress, CTAB GI/GU:  non-distended,  MSK/SPINE:  Decreased strength in RLE SKIN:  no rash, atraumatic   *Additional and/or pertinent findings included in MDM below  Diagnostic and Interventional Summary    EKG Interpretation  Date/Time:    Ventricular Rate:    PR Interval:    QRS Duration:   QT Interval:    QTC Calculation:   R Axis:     Text Interpretation:         Labs Reviewed  PROTIME-INR - Abnormal; Notable for the following components:      Result Value   Prothrombin Time 30.6 (*)    INR 3.0 (*)    All other components within normal limits  APTT - Abnormal; Notable for the following components:   aPTT 46 (*)    All other components within normal limits  CBC - Abnormal; Notable for  the following components:   WBC 11.1 (*)    RBC 5.88 (*)    Hemoglobin 15.5 (*)    HCT 50.4 (*)    RDW 20.0 (*)    All other components within normal limits  DIFFERENTIAL - Abnormal; Notable for the following components:   Neutro Abs 8.0 (*)    All other components within normal limits  COMPREHENSIVE METABOLIC PANEL - Abnormal; Notable for the following components:   CO2 19 (*)    Glucose, Bld 122 (*)    BUN 26 (*)    Creatinine, Ser 1.33 (*)    GFR, Estimated 40 (*)    All other components within normal limits  I-STAT CHEM 8, ED - Abnormal; Notable for the following components:   BUN 35 (*)    Creatinine, Ser 1.30 (*)    Glucose, Bld 122 (*)    Calcium, Ion 1.04 (*)    Hemoglobin 17.7 (*)    HCT 52.0 (*)    All other components within normal limits  ETHANOL  RAPID URINE DRUG SCREEN, HOSP PERFORMED  URINALYSIS, ROUTINE W REFLEX MICROSCOPIC    CT HEAD CODE STROKE WO CONTRAST  Final Result      Medications - No data to display   Procedures  /  Critical  Care .Critical Care  Performed by: Roxy Horseman, PA-C Authorized by: Roxy Horseman, PA-C   Critical care provider statement:    Critical care time (minutes):  32   Critical care was necessary to treat or prevent imminent or life-threatening deterioration of the following conditions:  CNS failure or compromise   Critical care was time spent personally by me on the following activities:  Development of treatment plan with patient or surrogate, discussions with consultants, evaluation of patient's response to treatment, examination of patient, ordering and review of laboratory studies, ordering and review of radiographic studies, ordering and performing treatments and interventions, pulse oximetry, re-evaluation of patient's condition and review of old charts   ED Course and Medical Decision Making  I have reviewed the triage vital signs, the nursing notes, and pertinent available records from the EMR.  Social  Determinants Affecting Complexity of Care: Patient has no clinically significant social determinants affecting this chief complaint..   ED Course: Clinical Course as of 04/25/22 0038  Sun Apr 24, 2022  2248 Patient seen by and discussed with Dr. Fredderick Phenix, who agrees with Code Stroke activation.   LKW 8pm.  Symptoms include RLE weakness and numbness.  Was unable to move leg while trying to walk up steps. [RB]    Clinical Course User Index [RB] Roxy Horseman, PA-C    Medical Decision Making Patient here with strokelike symptoms.  Onset earlier this afternoon, initially thought to be around 8 PM.  Code stroke activated.  CT shows no evidence of stroke.  CT discussion with teleneurologist below.  Will admit for stroke workup.  Amount and/or Complexity of Data Reviewed Labs: ordered.    Details: INR 3.0 No significant electrolyte abnormality CBC looks a bit dry Radiology: ordered and independent interpretation performed.    Details: No obvious ICH  Risk Decision regarding hospitalization.     Consultants: I consulted with our teleneurologist, Dr. Laurene Footman, who recommends admission for stroke workup.  Not a candidate for TNK due to risk factors.  She says LKW more likely around 1pm this afternoon based on her history.  Recommends continuing with patient normal anticoagulation.  If she is transitioned to heparin, advises no heparin bolus.  I consulted with Dr. Cyndia Bent, who is appreciated for admitting.   Treatment and Plan: Patient's exam and diagnostic results are concerning for stroke like symptoms.  Feel that patient will need admission to the hospital for further treatment and evaluation.    Final Clinical Impressions(s) / ED Diagnoses     ICD-10-CM   1. Stroke-like symptoms  R29.90       ED Discharge Orders     None         Discharge Instructions Discussed with and Provided to Patient:   Discharge Instructions   None      Roxy Horseman, PA-C 04/25/22  0040    Rolan Bucco, MD 04/27/22 [phone]

## 2022-04-24 NOTE — ED Notes (Signed)
Patient taken to CT at this time.

## 2022-04-24 NOTE — Consult Note (Incomplete)
2257: Code stroke cart activation. Pt had already gone to CT and come back to room at time of Code Stroke activation. Pt alert, oriented. Reports R Leg weakness. TSRN was given lkwt of 2030 by bedside staff. FSBS 168 with EMS per bedside staff. Bedside nurse drawing labs and establishing PIV access.  2300: Page sent to [facility] 2306: Dr Laurene Footman on camera, report given to Dr. Laurene Footman along with NCCT results 2309: Dr. Laurene Footman on screen with patient.  2326: No TNK due to out of window, LKWT established to be 1300 by Dr. Laurene Footman. No further needs from [facility]. Call disconnected.

## 2022-04-25 ENCOUNTER — Encounter (HOSPITAL_COMMUNITY): Payer: Self-pay | Admitting: Family Medicine

## 2022-04-25 ENCOUNTER — Other Ambulatory Visit: Payer: Self-pay

## 2022-04-25 ENCOUNTER — Observation Stay (HOSPITAL_BASED_OUTPATIENT_CLINIC_OR_DEPARTMENT_OTHER): Payer: Medicare Other

## 2022-04-25 ENCOUNTER — Observation Stay (HOSPITAL_COMMUNITY): Payer: Medicare Other

## 2022-04-25 DIAGNOSIS — Z933 Colostomy status: Secondary | ICD-10-CM

## 2022-04-25 DIAGNOSIS — E785 Hyperlipidemia, unspecified: Secondary | ICD-10-CM | POA: Diagnosis present

## 2022-04-25 DIAGNOSIS — I480 Paroxysmal atrial fibrillation: Secondary | ICD-10-CM | POA: Diagnosis present

## 2022-04-25 DIAGNOSIS — N1831 Chronic kidney disease, stage 3a: Secondary | ICD-10-CM | POA: Diagnosis present

## 2022-04-25 DIAGNOSIS — I2510 Atherosclerotic heart disease of native coronary artery without angina pectoris: Secondary | ICD-10-CM | POA: Diagnosis present

## 2022-04-25 DIAGNOSIS — I5189 Other ill-defined heart diseases: Secondary | ICD-10-CM | POA: Diagnosis not present

## 2022-04-25 DIAGNOSIS — Z881 Allergy status to other antibiotic agents status: Secondary | ICD-10-CM | POA: Diagnosis not present

## 2022-04-25 DIAGNOSIS — E039 Hypothyroidism, unspecified: Secondary | ICD-10-CM | POA: Diagnosis present

## 2022-04-25 DIAGNOSIS — Z86711 Personal history of pulmonary embolism: Secondary | ICD-10-CM

## 2022-04-25 DIAGNOSIS — I708 Atherosclerosis of other arteries: Secondary | ICD-10-CM | POA: Diagnosis present

## 2022-04-25 DIAGNOSIS — I110 Hypertensive heart disease with heart failure: Secondary | ICD-10-CM | POA: Diagnosis present

## 2022-04-25 DIAGNOSIS — I10 Essential (primary) hypertension: Secondary | ICD-10-CM | POA: Diagnosis not present

## 2022-04-25 DIAGNOSIS — R531 Weakness: Secondary | ICD-10-CM | POA: Diagnosis not present

## 2022-04-25 DIAGNOSIS — F419 Anxiety disorder, unspecified: Secondary | ICD-10-CM | POA: Diagnosis present

## 2022-04-25 DIAGNOSIS — I214 Non-ST elevation (NSTEMI) myocardial infarction: Secondary | ICD-10-CM

## 2022-04-25 DIAGNOSIS — I5022 Chronic systolic (congestive) heart failure: Secondary | ICD-10-CM

## 2022-04-25 DIAGNOSIS — E038 Other specified hypothyroidism: Secondary | ICD-10-CM | POA: Diagnosis not present

## 2022-04-25 DIAGNOSIS — E7800 Pure hypercholesterolemia, unspecified: Secondary | ICD-10-CM | POA: Diagnosis not present

## 2022-04-25 DIAGNOSIS — Z87891 Personal history of nicotine dependence: Secondary | ICD-10-CM | POA: Diagnosis not present

## 2022-04-25 DIAGNOSIS — I249 Acute ischemic heart disease, unspecified: Secondary | ICD-10-CM | POA: Diagnosis not present

## 2022-04-25 DIAGNOSIS — Z7901 Long term (current) use of anticoagulants: Secondary | ICD-10-CM

## 2022-04-25 DIAGNOSIS — I6523 Occlusion and stenosis of bilateral carotid arteries: Secondary | ICD-10-CM | POA: Diagnosis not present

## 2022-04-25 DIAGNOSIS — R7989 Other specified abnormal findings of blood chemistry: Secondary | ICD-10-CM | POA: Diagnosis not present

## 2022-04-25 DIAGNOSIS — R29810 Facial weakness: Secondary | ICD-10-CM | POA: Diagnosis present

## 2022-04-25 DIAGNOSIS — G252 Other specified forms of tremor: Secondary | ICD-10-CM | POA: Diagnosis present

## 2022-04-25 DIAGNOSIS — R29898 Other symptoms and signs involving the musculoskeletal system: Secondary | ICD-10-CM | POA: Diagnosis present

## 2022-04-25 DIAGNOSIS — I428 Other cardiomyopathies: Secondary | ICD-10-CM

## 2022-04-25 DIAGNOSIS — I447 Left bundle-branch block, unspecified: Secondary | ICD-10-CM | POA: Diagnosis present

## 2022-04-25 DIAGNOSIS — E1122 Type 2 diabetes mellitus with diabetic chronic kidney disease: Secondary | ICD-10-CM | POA: Diagnosis present

## 2022-04-25 DIAGNOSIS — I5181 Takotsubo syndrome: Secondary | ICD-10-CM | POA: Diagnosis present

## 2022-04-25 DIAGNOSIS — E1142 Type 2 diabetes mellitus with diabetic polyneuropathy: Secondary | ICD-10-CM | POA: Diagnosis present

## 2022-04-25 DIAGNOSIS — R2990 Unspecified symptoms and signs involving the nervous system: Secondary | ICD-10-CM | POA: Diagnosis not present

## 2022-04-25 DIAGNOSIS — G319 Degenerative disease of nervous system, unspecified: Secondary | ICD-10-CM | POA: Diagnosis not present

## 2022-04-25 DIAGNOSIS — Z880 Allergy status to penicillin: Secondary | ICD-10-CM | POA: Diagnosis not present

## 2022-04-25 DIAGNOSIS — I272 Pulmonary hypertension, unspecified: Secondary | ICD-10-CM | POA: Diagnosis present

## 2022-04-25 DIAGNOSIS — I429 Cardiomyopathy, unspecified: Secondary | ICD-10-CM | POA: Diagnosis not present

## 2022-04-25 LAB — COMPREHENSIVE METABOLIC PANEL
ALT: 14 U/L (ref 0–44)
AST: 22 U/L (ref 15–41)
Albumin: 3.8 g/dL (ref 3.5–5.0)
Alkaline Phosphatase: 69 U/L (ref 38–126)
Anion gap: 14 (ref 5–15)
BUN: 26 mg/dL — ABNORMAL HIGH (ref 8–23)
CO2: 19 mmol/L — ABNORMAL LOW (ref 22–32)
Calcium: 9 mg/dL (ref 8.9–10.3)
Chloride: 106 mmol/L (ref 98–111)
Creatinine, Ser: 1.33 mg/dL — ABNORMAL HIGH (ref 0.44–1.00)
GFR, Estimated: 40 mL/min — ABNORMAL LOW (ref >60)
Glucose, Bld: 122 mg/dL — ABNORMAL HIGH (ref 70–99)
Potassium: 4.1 mmol/L (ref 3.5–5.1)
Sodium: 139 mmol/L (ref 135–145)
Total Bilirubin: 1 mg/dL (ref 0.3–1.2)
Total Protein: 7.7 g/dL (ref 6.5–8.1)

## 2022-04-25 LAB — URINALYSIS, ROUTINE W REFLEX MICROSCOPIC
Bacteria, UA: NONE SEEN
Bilirubin Urine: NEGATIVE
Glucose, UA: >=500 mg/dL — AB
Ketones, ur: 5 mg/dL — AB
Leukocytes,Ua: NEGATIVE
Nitrite: NEGATIVE
Protein, ur: 30 mg/dL — AB
Specific Gravity, Urine: 1.026 (ref 1.005–1.030)
pH: 5 (ref 5.0–8.0)

## 2022-04-25 LAB — RAPID URINE DRUG SCREEN, HOSP PERFORMED
Amphetamines: NOT DETECTED
Barbiturates: NOT DETECTED
Benzodiazepines: NOT DETECTED
Cocaine: NOT DETECTED
Opiates: NOT DETECTED
Tetrahydrocannabinol: NOT DETECTED

## 2022-04-25 LAB — TROPONIN I (HIGH SENSITIVITY)
Troponin I (High Sensitivity): 1645 ng/L (ref <18)
Troponin I (High Sensitivity): 1869 ng/L (ref <18)
Troponin I (High Sensitivity): 1919 ng/L (ref <18)
Troponin I (High Sensitivity): 2278 ng/L (ref <18)

## 2022-04-25 LAB — HEMOGLOBIN A1C
Hgb A1c MFr Bld: 7.2 % — ABNORMAL HIGH (ref 4.8–5.6)
Mean Plasma Glucose: 159.94 mg/dL

## 2022-04-25 LAB — ECHOCARDIOGRAM COMPLETE
Est EF: 30
S' Lateral: 2.1 cm
Weight: 3129.6 oz

## 2022-04-25 LAB — APTT: aPTT: 46 seconds — ABNORMAL HIGH (ref 24–36)

## 2022-04-25 LAB — ETHANOL: Alcohol, Ethyl (B): <10 mg/dL (ref <10)

## 2022-04-25 LAB — LIPID PANEL
Cholesterol: 118 mg/dL (ref 0–200)
HDL: 49 mg/dL (ref >40)
LDL Cholesterol: 59 mg/dL (ref 0–99)
Total CHOL/HDL Ratio: 2.4 RATIO
Triglycerides: 51 mg/dL (ref <150)
VLDL: 10 mg/dL (ref 0–40)

## 2022-04-25 LAB — PROTIME-INR
INR: 3 — ABNORMAL HIGH (ref 0.8–1.2)
Prothrombin Time: 30.6 seconds — ABNORMAL HIGH (ref 11.4–15.2)

## 2022-04-25 MED ORDER — STROKE: EARLY STAGES OF RECOVERY BOOK
Freq: Once | Status: AC
  Filled 2022-04-25: qty 1

## 2022-04-25 MED ORDER — HYDROXYZINE HCL 25 MG PO TABS
25.0000 mg | ORAL_TABLET | Freq: Once | ORAL | Status: AC
  Administered 2022-04-25: 25 mg via ORAL
  Filled 2022-04-25: qty 1

## 2022-04-25 MED ORDER — ACETAMINOPHEN 160 MG/5ML PO SOLN: 650.0000 mg | ORAL | Status: DC | PRN

## 2022-04-25 MED ORDER — PERFLUTREN LIPID MICROSPHERE
1.0000 mL | INTRAVENOUS | Status: AC | PRN
  Administered 2022-04-25: 2 mL via INTRAVENOUS

## 2022-04-25 MED ORDER — LEVOTHYROXINE SODIUM 25 MCG PO TABS
25.0000 ug | ORAL_TABLET | Freq: Every day | ORAL | Status: DC
  Administered 2022-04-25 – 2022-04-30 (×6): 25 ug via ORAL
  Filled 2022-04-25 (×6): qty 1

## 2022-04-25 MED ORDER — NITROGLYCERIN 0.4 MG SL SUBL
0.4000 mg | SUBLINGUAL_TABLET | SUBLINGUAL | Status: DC | PRN
  Administered 2022-04-25 (×3): 0.4 mg via SUBLINGUAL
  Filled 2022-04-25 (×2): qty 1

## 2022-04-25 MED ORDER — MORPHINE SULFATE (PF) 2 MG/ML IV SOLN
1.0000 mg | INTRAVENOUS | Status: DC | PRN
  Administered 2022-04-25: 1 mg via INTRAVENOUS
  Filled 2022-04-25: qty 1

## 2022-04-25 MED ORDER — ATORVASTATIN CALCIUM 40 MG PO TABS
40.0000 mg | ORAL_TABLET | Freq: Every day | ORAL | Status: DC
  Administered 2022-04-26 – 2022-04-30 (×5): 40 mg via ORAL
  Filled 2022-04-25 (×5): qty 1

## 2022-04-25 MED ORDER — IOHEXOL 350 MG/ML SOLN
75.0000 mL | Freq: Once | INTRAVENOUS | Status: AC | PRN
  Administered 2022-04-25: 75 mL via INTRAVENOUS

## 2022-04-25 MED ORDER — ACETAMINOPHEN 650 MG RE SUPP: 650.0000 mg | RECTAL | Status: DC | PRN

## 2022-04-25 MED ORDER — ACETAMINOPHEN 325 MG PO TABS: 650.0000 mg | ORAL_TABLET | ORAL | Status: DC | PRN

## 2022-04-25 MED ORDER — WARFARIN - PHARMACIST DOSING INPATIENT: Freq: Every day | Status: DC

## 2022-04-25 MED ORDER — WARFARIN SODIUM 2.5 MG PO TABS
2.5000 mg | ORAL_TABLET | ORAL | Status: AC
  Administered 2022-04-25: 2.5 mg via ORAL
  Filled 2022-04-25 (×2): qty 1

## 2022-04-25 NOTE — Assessment & Plan Note (Signed)
continue statin

## 2022-04-25 NOTE — Progress Notes (Signed)
Progress Note   Patient: Hannah [name] ZOX:[id number] DOB: 09/16/1940 DOA: 04/24/2022     0 DOS: the patient was seen and examined on 04/25/2022   Brief hospital course: 82 y.o. female with medical history significant of pulmonary hypertension, CAD, hypertension, paroxysmal atrial fibrillation, PE on Coumadin, type 2 diabetes, CKD 3A, hypothyroidism and hyperlipidemia who presents with right lower extremity weakness.   Patient believes symptoms happened around 8pm. She ambulates with walker at baseline. She was able to go down stairs at her daughter's home. Then had to go up a few stairs into her own home but could not get her right leg to lift up even with assistance from family members. Denies any headache or vision. Feels mid-chest tightness since being in ED but thinks due to anxiety. Also has notable right UE intention tremor she reports started 2 days ago.    In the ED, she was afebrile with elevated blood pressure of up to 180s over 90s on room air.   Code stroke was initiated in the ED. Pt was admitted for stroke work up  Assessment and Plan: Right sided weakness -Right sided weakness on exam with right facial drop. Concerning for stroke.  - MRI brain without acute infarct noted - obtain CTA head and neck unremarkable, reviewed - 2d echo with EF 30%, wall motion noted, no shunt seen. See below - continue atorvastatin -cont anticoagulation, however holding coumadin per below, with transition to heparin gtt when INR<2 -Obtain A1c and lipids -PT/OT/SLT -Allow for permissive hypertension with blood pressure treatment as needed only if systolic goes above 220 -discussed case with Neurology. Stroke w/u essentially complete. Rec to continue with therapeutic anticoagulation  NSTEMI -LHC in 2022 with non-obstructive disease of LAD OR CIRCUMFLEX. Mild plaque to large, dominant RCA.  -initial trop 1600 this AM with pt complaining of "indigestion" -Chest discomfort eventually improved after  NTG x3, however bp did trend down to <100 -Consulted Cardiology, appreciate recs -Pt has hx NICM with reduced EF in past. Per Cardiology, pt would consider cath if 2d echo is abnormal, however would need INR reversed prior to procedure -2d echo noted per above   S/P colostomy -secondary to hx of large bowel stricture, s/p colectomy  -daily colostomy care   Chronic systolic CHF (congestive heart failure) -holding Entresto, beta-blocker to avoid permissive HTN   HTN (hypertension) -hold home antihypertensive to allow for 24 hrs permissive HTN   PAF (paroxysmal atrial fibrillation) -Had been on warfarin PTA -Per Cardiology, hold  -holding beta-blocker for permissive HTN   Chronic kidney disease, stage 3a (HCC) -creatinine stable at baseline   History of pulmonary embolism -On warfarin with goal of 2.5-3.5 PTA -neurology recommends continuing anticoagulation per above -Per Cardiology, rec to hold coumadin and transition to heparin gtt once INR<2   Hypothyroid -continue levothyroxine   Hyperlipidemia -continue statin   DM2 (diabetes mellitus, type 2) -no hyperglycemia -remote A1C 08/2021 was controlled 6.7, repeat A1c 7.2  Subjective: Complained of "indigestion" this AM with some chest pains  Physical Exam: Vitals:   04/25/22 0842 04/25/22 0945 04/25/22 1122 04/25/22 1415  BP: 94/68 106/68 134/80 126/75  Pulse: 97 92 94 91  Resp:   18   Temp:   98.3 F (36.8 C) 98.2 F (36.8 C)  TempSrc:   Oral Oral  SpO2:   97% 98%  Weight:       General exam: Awake, laying in bed, in nad Respiratory system: Normal respiratory effort, no wheezing Cardiovascular system: regular rate, s1,  s2 Gastrointestinal system: Soft, nondistended, positive BS Central nervous system: CN2-12 grossly intact, R facial droop noted this AM Extremities: Perfused, no clubbing Skin: Normal skin turgor, no notable skin lesions seen Psychiatry: Mood normal // no visual hallucinations   Data  Reviewed:  Labs reviewed: Trop 1645, 1869, 1919, 2278  Family Communication: Pt in room, family not at bedside  Disposition: Status is: Observation The patient will require care spanning > 2 midnights and should be moved to inpatient because: Severity of illness  Planned Discharge Destination:  Unclear at this time     Author: Rickey Barbara, MD 04/25/2022 3:28 PM  For on call review [email or web address].

## 2022-04-25 NOTE — Progress Notes (Signed)
Critical tropnoin of 1645 received at 0816, Dr. Rhona Leavens paged. New orders placed.  See new orders.  Repeat critical troponin of 1869 received from lab, Dr. Rhona Leavens paged.  Critical troponin of 1919 received, Dr. Rhona Leavens paged, cardiology made aware.  See new orders.  Repeat troponin of 2278, Dr. Rhona Leavens paged, Dr. Cristal Deer made aware.  Dr. Rhona Leavens ordered transfer to progressive unit.  Report called.  Patient transferred to room 1430.  Daughter [name], updated with plan of care.  Levora Angel, RN

## 2022-04-25 NOTE — Assessment & Plan Note (Signed)
continue levothyroxine

## 2022-04-25 NOTE — Assessment & Plan Note (Signed)
-  no hyperglycemia -remote A1C 08/2021 was controlled 6.7.  -A1C pending

## 2022-04-25 NOTE — Progress Notes (Signed)
PT Cancellation Note  Patient Details Name: Hannah [name] MRN: [id number] DOB: Jul 10, 1940   Cancelled Treatment:    Reason Eval/Treat Not Completed: Medical issues which prohibited therapy. Per nurse report pt is not medically stable to participate or mobilize with therapy at this time. Pt has elevated troponin levels and may transfer to [facility]. PT will continue to monitor and return for evaluation if appropriate.   Rica Mote, PT   Jacqualyn Posey 04/25/2022, 10:53 AM

## 2022-04-25 NOTE — H&P (Signed)
History and Physical    Patient: Hannah [name] ZOX:[id number] DOB: [birth year] DOA: 04/24/2022 DOS: the patient was seen and examined on 04/25/2022 PCP: [name], MD  Patient coming from: Home  Chief Complaint:  Chief Complaint  Patient presents with   Weakness   HPI: Hannah [name] is a 82 y.o. female with medical history significant of pulmonary hypertension, CAD, hypertension, paroxysmal atrial fibrillation, PE on Coumadin, type 2 diabetes, CKD 3A, hypothyroidism and hyperlipidemia who presents with right lower extremity weakness.  Patient believes symptoms happened around 8pm. She ambulates with walker at baseline. She was able to go down stairs at her daughter's home. Then had to go up a few stairs into her own home but could not get her right leg to lift up even with assistance from family members. Denies any headache or vision. Feels mid-chest tightness since being in ED but thinks due to anxiety. Also has notable right UE intention tremor she reports started 2 days ago.   In the ED, she was afebrile with elevated blood pressure of up to 180s over 90s on room air.  Code stroke was initiated in the ED.  She was evaluated by teleneurology. Deemed not a candidate for thrombolytics due to last known well greater than 4.5hours.   CT head was negative. No other significant lab findings. INR of 3.   Neurology recommend admission for stroke workup and permissive HTN. No urgent need for MRI brain overnight. Advised continuing warfarin since risk outweigh benefit.  Review of Systems: As mentioned in the history of present illness. All other systems reviewed and are negative. Past Medical History:  Diagnosis Date   Acute massive pulmonary embolism (HCC) 07/13/2012   Massive PE w/ PEA arrest 07/13/12 >TNK >IVC filter >discharged on comadin     Anticoagulated on warfarin    Chronic diastolic CHF (congestive heart failure) (HCC) 03/15/2017   Chronic kidney disease, stage 3a (HCC) 12/15/2018    Diabetes mellitus, type 2 (HCC) 07/15/2012   with peripheral neuropathy   DM2 (diabetes mellitus, type 2) (HCC) 07/23/2012   HFrEF (heart failure with reduced ejection fraction) (HCC) 03/15/2017   Non-ischemic cardiomyopathy // Cardiac catheterization 01/01/21:  RCA prox to mid 20 // Echocardiogram 12/26/20:  EF 30-35, ant septum/apex, mid inf-sept AK; ant-lat/ant/inf HK, Gr 1 DD, mild LVH, normal RVSF, RVSP 27.2, small effusion (ant to RV), mild MR, mod TR, AV sclerosis w/o AS   Hyperlipemia 07/14/2012   Hyperlipidemia 02/25/2013   Hypertension 07/14/2012   Hypothyroid 06/06/2013   Large bowel stricture (HCC)    s/p colectomy in 2016   Neuropathy 02/12/2015   Osteoarthritis    s/p hip and knee replacements   PAF (paroxysmal atrial fibrillation) (HCC)    Pulmonary hypertension (HCC) 07/26/2016   Syncope 10/2018   Thyroid disease    Past Surgical History:  Procedure Laterality Date   COLONOSCOPY N/A 03/12/2016   Procedure: COLONOSCOPY;  Surgeon: Dorena Cookey, MD;  Location: [facility] ENDOSCOPY;  Service: Endoscopy;  Laterality: N/A;   COLOSTOMY N/A 06/03/2014   Procedure: COLOSTOMY;  Surgeon: Harriette Bouillon, MD;  Location: [facility] OR;  Service: General;  Laterality: N/A;   FLEXIBLE SIGMOIDOSCOPY N/A 05/30/2014   Procedure: Arnell Sieving;  Surgeon: Jeani Hawking, MD;  Location: [facility] ENDOSCOPY;  Service: Endoscopy;  Laterality: N/A;   INSERTION OF VENA CAVA FILTER N/A 07/16/2012   Procedure: INSERTION OF VENA CAVA FILTER;  Surgeon: [name], MD;  Location: [facility] CATH LAB;  Service: Cardiovascular;  Laterality: N/A;   KNEE  ARTHROSCOPY     LEFT HEART CATH AND CORONARY ANGIOGRAPHY N/A 01/01/2021   Procedure: LEFT HEART CATH AND CORONARY ANGIOGRAPHY;  Surgeon: Kathleene Hazel, MD;  Location: [facility] INVASIVE CV LAB;  Service: Cardiovascular;  Laterality: N/A;   PARTIAL COLECTOMY N/A 06/03/2014   Procedure: PARTIAL COLECTOMY;  Surgeon: Harriette Bouillon, MD;  Location: [facility] OR;  Service: General;  Laterality:  N/A;   REDUCTION MAMMAPLASTY Bilateral    RIGHT HEART CATH N/A 12/30/2020   Procedure: RIGHT HEART CATH;  Surgeon: Kathleene Hazel, MD;  Location: [facility] INVASIVE CV LAB;  Service: Cardiovascular;  Laterality: N/A;   SP ARTHRO HIP*L*     Social History:  reports that she quit smoking about 54 years ago. Her smoking use included cigarettes. She has a 10.00 pack-year smoking history. She has never used smokeless tobacco. She reports that she does not drink alcohol and does not use drugs.  Allergies  Allergen Reactions   Augmentin [Amoxicillin-Pot Clavulanate] Itching and Other (See Comments)    Severe vaginal itching   Tranxene [Clorazepate] Itching   Crestor [Rosuvastatin]     Made her sick on stomach, she is able to tolerate the zocor   Penicillins Itching and Rash    Has patient had a PCN reaction causing immediate rash, facial/tongue/throat swelling, SOB or lightheadedness with hypotension: Yes Has patient had a PCN reaction causing severe rash involving mucus membranes or skin necrosis: No Has patient had a PCN reaction that required hospitalization: No Has patient had a PCN reaction occurring within the last 10 years: No If all of the above answers are "NO", then may proceed with Cephalosporin use.     Family History  Problem Relation Age of Onset   Breast cancer Mother     Prior to Admission medications   Medication Sig Start Date End Date Taking? Authorizing Provider  acetaminophen (TYLENOL) 500 MG tablet Take 500 mg by mouth every 6 (six) hours as needed for mild pain.     [provider]  atorvastatin (LIPITOR) 40 MG tablet Take 1 tablet (40 mg total) by mouth daily. 03/03/22   [name], MD  Blood Glucose Monitoring Suppl [facility] VERIO) w/Device KIT 1 each by Does not apply route as directed. 06/07/18   Wynn Banker, MD  dapagliflozin propanediol (FARXIGA) 10 MG TABS tablet Take 1 tablet (10 mg total) by mouth daily before breakfast. 12/23/21    Pricilla Riffle, MD  diclofenac Sodium (VOLTAREN) 1 % GEL Apply 4 g topically 4 (four) times daily. 06/26/19   Lamptey, Britta Mccreedy, MD  furosemide (LASIX) 20 MG tablet Take 1 tablet (20 mg total) by mouth daily as needed for fluid or edema. 03/10/21   Koberlein, Jannette Spanner C, MD  glucose blood (ONETOUCH VERIO) test strip USE 1 STRIP TO CHECK GLUCOSE ONCE DAILY AS DIRECTED 08/14/20   Wynn Banker, MD  levothyroxine (SYNTHROID) 25 MCG tablet TAKE 1 TABLET BY MOUTH ONCE DAILY BEFORE BREAKFAST 03/14/22   [name], MD  metoprolol succinate (TOPROL-XL) 50 MG 24 hr tablet Take 1 tablet by mouth in the evening 02/18/22   Pricilla Riffle, MD  OneTouch Delica Lancets 30G MISC USE 1  TO CHECK GLUCOSE ONCE DAILY 07/02/18   Koberlein, Paris Lore, MD  pantoprazole (PROTONIX) 40 MG tablet Take 1 tablet (40 mg total) by mouth daily. 02/26/21   Koberlein, Paris Lore, MD  sacubitril-valsartan (ENTRESTO) 24-26 MG Take 1 tablet by mouth 2 (two) times daily. 12/14/21   Nira Conn  M, MD  spironolactone (ALDACTONE) 25 MG tablet Take 1/2 (one-half) tablet by mouth once daily 03/14/22   Pricilla Riffle, MD  vitamin B-12 (CYANOCOBALAMIN) 1000 MCG tablet Take 1 tablet (1,000 mcg total) by mouth daily. 05/15/20   Wynn Banker, MD  vitamin C (ASCORBIC ACID) 500 MG tablet Take 500 mg by mouth daily.     [provider]  warfarin (COUMADIN) 5 MG tablet TAKE 1-2 TABLETS BY MOUTH ONCE DAILY OR AS DIRECTED  BY  COUMADIN  CLINIC 11/22/21   Pricilla Riffle, MD    Physical Exam: Vitals:   04/24/22 2230 04/24/22 2245 04/24/22 2300 04/24/22 2315  BP: (!) 167/106 (!) 178/95 (!) 187/91 (!) 183/91  Pulse: 84 88 99 (!) 109  Resp: (!) 33 19 (!) 30 18  Temp:      SpO2: 98% 98% 100% 98%   Constitutional: NAD, calm, comfortable, elderly female sitting upright in bed Eyes: PERRL, lids and conjunctivae normal ENMT: Mucous membranes are moist.  Neck: normal, supple Respiratory: clear to auscultation bilaterally, no wheezing,  no crackles. Normal respiratory effort. No accessory muscle use.  Cardiovascular: Regular rate and rhythm, no murmurs / rubs / gallops. No extremity edema.  Abdomen: no tenderness, left colostomy in place with normal stoma-empty bag. Bowel sounds positive.  Musculoskeletal: no clubbing / cyanosis. No joint deformity upper and lower extremities. Normal muscle tone.  Skin: no rashes, lesions, ulcers.  Neurologic: CN 2-12 grossly intact. Sensation intact. Right facial droop. No RUE drift and able to lift against gravity but not with resistance. Intention tremor of the right hand. Able to lift right lower extremity against gravity but not resistance. Weak right plantar flexion.  psychiatric: Normal judgment and insight. Alert and oriented x 3. Normal mood. Data Reviewed:  See HPI  Assessment and Plan: * Right sided weakness -Right sided weakness on exam with right facial drop. Highly concerning for stroke.  - obtain MRI brain-no urgent need to get this overnight per neurology. Will continue workup here at [facility] - obtain CTA head and neck - obtain echocardiogram  - continue atorvastatin -continue warfarin- benefit outweights risk given hx of massive PE -Obtain A1c and lipids -PT/OT/SLT -Frequent neuro checks and keep on telemetry -Allow for permissive hypertension with blood pressure treatment as needed only if systolic goes above 220  S/P colostomy -secondary to hx of large bowel stricture, s/p colectomy  -daily colostomy care  Chronic systolic CHF (congestive heart failure) -holding Entresto, beta-blocker to avoid permissive HTN  HTN (hypertension) -hold home antihypertensive to allow for 24 hrs permissive HTN  PAF (paroxysmal atrial fibrillation) -on warfarin -holding beta-blocker for permissive HTN  CAD (coronary artery disease) -reports chest tightness but she attributes it potentially to anxiety -EKG with IVCD but no ST changes -will obtain troponin -LHC in 2022 with  non-obstructive disease of LAD OR CIRCUMFLEX. Mild plaque to large, dominant RCA.  -Doubt symptoms are cardiac related- give a dose of hydroxyzine for anxiety  Chronic kidney disease, stage 3a (HCC) -creatinine stable at baseline  History of pulmonary embolism -On warfarin with goal of 2.5-3.5 -neurology recommends continuing as benefit outweight risk -warfarin per pharmacy-  Hypothyroid -continue levothyroxine  Hyperlipidemia -continue statin  DM2 (diabetes mellitus, type 2) -no hyperglycemia -remote A1C 08/2021 was controlled 6.7.  -A1C pending      Advance Care Planning: Full  Consults: tele-neurology  Family Communication: none at bedside  Severity of Illness: The appropriate patient status for this patient is OBSERVATION. Observation status  is judged to be reasonable and necessary in order to provide the required intensity of service to ensure the patient's safety. The patient's presenting symptoms, physical exam findings, and initial radiographic and laboratory data in the context of their medical condition is felt to place them at decreased risk for further clinical deterioration. Furthermore, it is anticipated that the patient will be medically stable for discharge from the hospital within 2 midnights of admission.   Author: Anselm Jungling, DO 04/25/2022 1:29 AM  For on call review [email or web address].

## 2022-04-25 NOTE — Assessment & Plan Note (Signed)
-  holding Entresto, beta-blocker to avoid permissive HTN

## 2022-04-25 NOTE — Assessment & Plan Note (Addendum)
-  Right sided weakness on exam with right facial drop. Highly concerning for stroke.  - obtain MRI brain-no urgent need to get this overnight per neurology. Will continue workup here at [facility] [place]. - obtain CTA head and neck - obtain echocardiogram  - continue atorvastatin -continue warfarin- benefit outweights risk given hx of massive PE -Obtain A1c and lipids -PT/OT/SLT -Frequent neuro checks and keep on telemetry -Allow for permissive hypertension with blood pressure treatment as needed only if systolic goes above 220

## 2022-04-25 NOTE — Assessment & Plan Note (Signed)
-  On warfarin with goal of 2.5-3.5 -neurology recommends continuing as benefit outweight risk -warfarin per pharmacy-

## 2022-04-25 NOTE — Assessment & Plan Note (Signed)
-  creatinine stable at baseline

## 2022-04-25 NOTE — Consult Note (Addendum)
Cardiology Consultation   Patient ID: Hannah [name] MRN: [id number]; DOB: 03-06-1940  Admit date: 04/24/2022 Date of Consult: 04/25/2022  PCP:  [name], MD   [facility] Providers Cardiologist:  Dietrich Pates, MD  Cardiology APP:  Beatrice Lecher, PA-C       Patient Profile:   Hannah [name] is a 82 y.o. female with a hx of massive PE (lifelong anticoagulation), congestive heart failure (recovered EF) with non-ischemic cardiomyopathy, coronary calcification on CT, paroxysmal afib, hypertension, hyperlipidemia, hypothyroidism, CKD, large bowel stricture, lower GI bleed who is being seen 04/25/2022 for the evaluation of elevated troponin at the request of Dr. Rhona Leavens.  History of Present Illness:   Hannah [name] presented to the [facility] ED on 4/21 with EMS for evaluation of symptoms of right lower extremity weakness, last normal around 8pm on 4/21. Patient found that she was unable to lift her right leg and also had associated numbness. Patient denied other neurological abnormalities. She did report some generalized chest discomfort. Initial workup noted hypertension with BP 180s/90s. Given leg weakness, a code stroke was called and patient was seen by teleneurology. CT head negative for acute intracranial abnormality and neurology recommended admission for further stroke workup with brain MRI. Permissive hypertension also recommended. Initial labwork noted creatinine 1.33, WBC 11.1. Given chest discomfort, troponin also checked, found elevated at 1645->1869.  On my exam, patient denies any recent anginal symptoms, exertional intolerance, shortness of breath. She says that she has been doing great until sudden onset leg weakness yesterday. Overnight had brief episode of chest tightness which has since resolved. Currently feels near normal. She is unsure how weak her leg remains since she has not been out of bed.   Patient's cardiac history notable for HFrEF, EF down to 30%, non-ischemic  cardiomyopathy. EF now recovered to normal range on GDMT as of May 2023. She was continued on Entresto 24/26 bid, dapagliflozin 10, Toprol XL 50, aldactone. LHC in 2022 showed minimal nonobstructive CAD. Patient with hx paroxysmal afib, though has been noted to be maintaining NSR in cardiology follow up.   Past Medical History:  Diagnosis Date   Acute massive pulmonary embolism (HCC) 07/13/2012   Massive PE w/ PEA arrest 07/13/12 >TNK >IVC filter >discharged on comadin     Anticoagulated on warfarin    Chronic diastolic CHF (congestive heart failure) (HCC) 03/15/2017   Chronic kidney disease, stage 3a (HCC) 12/15/2018   Diabetes mellitus, type 2 (HCC) 07/15/2012   with peripheral neuropathy   DM2 (diabetes mellitus, type 2) (HCC) 07/23/2012   HFrEF (heart failure with reduced ejection fraction) (HCC) 03/15/2017   Non-ischemic cardiomyopathy // Cardiac catheterization 01/01/21:  RCA prox to mid 20 // Echocardiogram 12/26/20:  EF 30-35, ant septum/apex, mid inf-sept AK; ant-lat/ant/inf HK, Gr 1 DD, mild LVH, normal RVSF, RVSP 27.2, small effusion (ant to RV), mild MR, mod TR, AV sclerosis w/o AS   Hyperlipemia 07/14/2012   Hyperlipidemia 02/25/2013   Hypertension 07/14/2012   Hypothyroid 06/06/2013   Large bowel stricture (HCC)    s/p colectomy in 2016   Neuropathy 02/12/2015   Osteoarthritis    s/p hip and knee replacements   PAF (paroxysmal atrial fibrillation) (HCC)    Pulmonary hypertension (HCC) 07/26/2016   Syncope 10/2018   Thyroid disease     Past Surgical History:  Procedure Laterality Date   COLONOSCOPY N/A 03/12/2016   Procedure: COLONOSCOPY;  Surgeon: Dorena Cookey, MD;  Location: [facility] ENDOSCOPY;  Service: Endoscopy;  Laterality: N/A;  COLOSTOMY N/A 06/03/2014   Procedure: COLOSTOMY;  Surgeon: Harriette Bouillon, MD;  Location: [facility] OR;  Service: General;  Laterality: N/A;   FLEXIBLE SIGMOIDOSCOPY N/A 05/30/2014   Procedure: Arnell Sieving;  Surgeon: Jeani Hawking, MD;  Location: [facility]  ENDOSCOPY;  Service: Endoscopy;  Laterality: N/A;   INSERTION OF VENA CAVA FILTER N/A 07/16/2012   Procedure: INSERTION OF VENA CAVA FILTER;  Surgeon: [name], MD;  Location: [facility] CATH LAB;  Service: Cardiovascular;  Laterality: N/A;   KNEE ARTHROSCOPY     LEFT HEART CATH AND CORONARY ANGIOGRAPHY N/A 01/01/2021   Procedure: LEFT HEART CATH AND CORONARY ANGIOGRAPHY;  Surgeon: Kathleene Hazel, MD;  Location: [facility] INVASIVE CV LAB;  Service: Cardiovascular;  Laterality: N/A;   PARTIAL COLECTOMY N/A 06/03/2014   Procedure: PARTIAL COLECTOMY;  Surgeon: Harriette Bouillon, MD;  Location: [facility] OR;  Service: General;  Laterality: N/A;   REDUCTION MAMMAPLASTY Bilateral    RIGHT HEART CATH N/A 12/30/2020   Procedure: RIGHT HEART CATH;  Surgeon: Kathleene Hazel, MD;  Location: [facility] INVASIVE CV LAB;  Service: Cardiovascular;  Laterality: N/A;   SP ARTHRO HIP*L*       Home Medications:  Prior to Admission medications   Medication Sig Start Date End Date Taking? Authorizing Provider  acetaminophen (TYLENOL) 500 MG tablet Take 500 mg by mouth every 6 (six) hours as needed for mild pain.     [provider]  atorvastatin (LIPITOR) 40 MG tablet Take 1 tablet (40 mg total) by mouth daily. 03/03/22   [name], MD  dapagliflozin propanediol (FARXIGA) 10 MG TABS tablet Take 1 tablet (10 mg total) by mouth daily before breakfast. 12/23/21   Pricilla Riffle, MD  diclofenac Sodium (VOLTAREN) 1 % GEL Apply 4 g topically 4 (four) times daily. 06/26/19   Lamptey, Britta Mccreedy, MD  furosemide (LASIX) 20 MG tablet Take 1 tablet (20 mg total) by mouth daily as needed for fluid or edema. 03/10/21   Koberlein, Jannette Spanner C, MD  glucose blood (ONETOUCH VERIO) test strip USE 1 STRIP TO CHECK GLUCOSE ONCE DAILY AS DIRECTED 08/14/20   Wynn Banker, MD  levothyroxine (SYNTHROID) 25 MCG tablet TAKE 1 TABLET BY MOUTH ONCE DAILY BEFORE BREAKFAST 03/14/22   [name], MD  metoprolol succinate (TOPROL-XL)  50 MG 24 hr tablet Take 1 tablet by mouth in the evening 02/18/22   Pricilla Riffle, MD  OneTouch Delica Lancets 30G MISC USE 1  TO CHECK GLUCOSE ONCE DAILY 07/02/18   Koberlein, Paris Lore, MD  pantoprazole (PROTONIX) 40 MG tablet Take 1 tablet (40 mg total) by mouth daily. 02/26/21   Koberlein, Paris Lore, MD  sacubitril-valsartan (ENTRESTO) 24-26 MG Take 1 tablet by mouth 2 (two) times daily. 12/14/21   [name], MD  spironolactone (ALDACTONE) 25 MG tablet Take 1/2 (one-half) tablet by mouth once daily 03/14/22   Pricilla Riffle, MD  vitamin B-12 (CYANOCOBALAMIN) 1000 MCG tablet Take 1 tablet (1,000 mcg total) by mouth daily. 05/15/20   Wynn Banker, MD  vitamin C (ASCORBIC ACID) 500 MG tablet Take 500 mg by mouth daily.     [provider]  warfarin (COUMADIN) 5 MG tablet TAKE 1-2 TABLETS BY MOUTH ONCE DAILY OR AS DIRECTED  BY  COUMADIN  CLINIC 11/22/21   Pricilla Riffle, MD    Inpatient Medications: Scheduled Meds:  [START ON 04/26/2022]  stroke: early stages of recovery book   Does not apply Once   atorvastatin  40 mg Oral Daily   levothyroxine  25 mcg Oral Q0600   Continuous Infusions:  PRN Meds: acetaminophen **OR** acetaminophen (TYLENOL) oral liquid 160 mg/5 mL **OR** acetaminophen, morphine injection, nitroGLYCERIN  Allergies:    Allergies  Allergen Reactions   Augmentin [Amoxicillin-Pot Clavulanate] Itching and Other (See Comments)    Severe vaginal itching   Tranxene [Clorazepate] Itching   Crestor [Rosuvastatin]     Made her sick on stomach, she is able to tolerate the zocor   Penicillins Itching and Rash    Social History:   Social History   Socioeconomic History   Marital status: Widowed    Spouse name: Not on file   Number of children: Not on file   Years of education: Not on file   Highest education level: Not on file  Occupational History   Not on file  Tobacco Use   Smoking status: Former    Packs/day: 1.00    Years: 10.00    Additional  pack years: 0.00    Total pack years: 10.00    Types: Cigarettes    Quit date: 01/04/1968    Years since quitting: 54.3   Smokeless tobacco: Never  Vaping Use   Vaping Use: Never used  Substance and Sexual Activity   Alcohol use: No   Drug use: No   Sexual activity: Not on file  Other Topics Concern   Not on file  Social History Narrative   Lives alone.          Social Determinants of Health   Financial Resource Strain: Low Risk  (12/13/2021)   Overall Financial Resource Strain (CARDIA)    Difficulty of Paying Living Expenses: Not very hard  Food Insecurity: No Food Insecurity (04/25/2022)   Hunger Vital Sign    Worried About Running Out of Food in the Last Year: Never true    Ran Out of Food in the Last Year: Never true  Transportation Needs: No Transportation Needs (04/25/2022)   PRAPARE - Administrator, Civil Service (Medical): No    Lack of Transportation (Non-Medical): No  Physical Activity: Insufficiently Active (06/24/2021)   Exercise Vital Sign    Days of Exercise per Week: 7 days    Minutes of Exercise per Session: 20 min  Stress: No Stress Concern Present (06/24/2021)   [employer] of Occupational Health - Occupational Stress Questionnaire    Feeling of Stress : Not at all  Social Connections: Moderately Integrated (06/24/2021)   Social Connection and Isolation Panel [NHANES]    Frequency of Communication with Friends and Family: More than three times a week    Frequency of Social Gatherings with Friends and Family: More than three times a week    Attends Religious Services: More than 4 times per year    Active Member of [employer] or Organizations: Yes    Attends Banker Meetings: More than 4 times per year    Marital Status: Widowed  Intimate Partner Violence: Not At Risk (04/25/2022)   Humiliation, Afraid, Rape, and Kick questionnaire    Fear of Current or Ex-Partner: No    Emotionally Abused: No    Physically Abused: No    Sexually  Abused: No    Family History:    Family History  Problem Relation Age of Onset   Breast cancer Mother      ROS:  Please see the history of present illness.   All other ROS reviewed and negative.     Physical Exam/Data:  Vitals:   04/25/22 0803 04/25/22 0815 04/25/22 0842 04/25/22 0945  BP: 127/76 (!) 118/93 94/68 106/68  Pulse: (!) 105 (!) 113 97 92  Resp: 18 18    Temp: 98.1 F (36.7 C) 98 F (36.7 C)    TempSrc:  Oral    SpO2: 95% 97%    Weight:        Intake/Output Summary (Last 24 hours) at 04/25/2022 1059 Last data filed at 04/25/2022 0900 Gross per 24 hour  Intake 0 ml  Output 550 ml  Net -550 ml      04/25/2022    2:49 AM 12/15/2021    2:53 PM 11/29/2021    4:10 PM  Last 3 Weights  Weight (lbs) 195 lb 9.6 oz 198 lb 12.8 oz 199 lb  Weight (kg) 88.724 kg 90.175 kg 90.266 kg     Body mass index is 30.64 kg/m.  General:  Well nourished, well developed, in no acute distress HEENT: normal Neck: no JVD Vascular: No carotid bruits; Distal pulses 2+ bilaterally Cardiac:  normal S1, S2; RRR; no murmur  Lungs:  clear to auscultation bilaterally, no wheezing, rhonchi or rales  Abd: soft, nontender, no hepatomegaly  Ext: no edema Musculoskeletal:  No deformitiesSkin: warm and dry  Neuro:  CNs 2-12 intact. No facial droop or speech abnormalities noted on my exam. Generalized right side weakness greater in leg than arm. Psych:  Normal affect   EKG:  The EKG was personally reviewed and demonstrates:  sinus tachycardia with known IVCD. No acute ischemic changes appreciated. Telemetry:  Telemetry was personally reviewed and demonstrates:  sinus rhythm/sinus tachycardia  Relevant CV Studies:  Cardiac catheterization 01/01/21 RCA prox to mid 20   Cardiac catheterization 12/30/20 Normal R heart pressures Unsuccessful L heart cath > returned 01/01/21   Echocardiogram 12/26/20 EF 30-35, ant septum/apex, mid inf-sept AK; ant-lat/ant/inf HK, Gr 1 DD, mild LVH,  normal RVSF, RVSP 27.2, small effusion (ant to RV), mild MR, mod TR, AV sclerosis w/o AS   Echocardiogram 04/23/20 Inf HK, EF 45-50, mild LVH, Gr 1 DD   Myoview 12/16/2018 No ischemia or infarction, EF 57; Low Risk    Echocardiogram 10/31/2018 EF 55-60, Gr 1 DD, normal RVSF, mild MAC, mild MR, mild TR  Laboratory Data:  High Sensitivity Troponin:   Recent Labs  Lab 04/25/22 0549 04/25/22 0840  TROPONINIHS 1,645* 1,869*     Chemistry Recent Labs  Lab 04/24/22 2321 04/24/22 2336  NA 139 139  K 4.1 4.4  CL 106 109  CO2 19*  --   GLUCOSE 122* 122*  BUN 26* 35*  CREATININE 1.33* 1.30*  CALCIUM 9.0  --   GFRNONAA 40*  --   ANIONGAP 14  --     Recent Labs  Lab 04/24/22 2321  PROT 7.7  ALBUMIN 3.8  AST 22  ALT 14  ALKPHOS 69  BILITOT 1.0   Lipids  Recent Labs  Lab 04/25/22 0549  CHOL 118  TRIG 51  HDL 49  LDLCALC 59  CHOLHDL 2.4    Hematology Recent Labs  Lab 04/24/22 2321 04/24/22 2336  WBC 11.1*  --   RBC 5.88*  --   HGB 15.5* 17.7*  HCT 50.4* 52.0*  MCV 85.7  --   MCH 26.4  --   MCHC 30.8  --   RDW 20.0*  --   PLT 261  --    Thyroid No results for input(s): "TSH", "FREET4" in the last 168 hours.  BNPNo results for  input(s): "BNP", "PROBNP" in the last 168 hours.  DDimer No results for input(s): "DDIMER" in the last 168 hours.   Radiology/Studies:  CT ANGIO HEAD NECK W WO CM  Result Date: 04/25/2022 CLINICAL DATA:  Acute neurologic deficit EXAM: CT ANGIOGRAPHY HEAD AND NECK WITH AND WITHOUT CONTRAST TECHNIQUE: Multidetector CT imaging of the head and neck was performed using the standard protocol during bolus administration of intravenous contrast. Multiplanar CT image reconstructions and MIPs were obtained to evaluate the vascular anatomy. Carotid stenosis measurements (when applicable) are obtained utilizing NASCET criteria, using the distal internal carotid diameter as the denominator. RADIATION DOSE REDUCTION: This exam was performed  according to the departmental dose-optimization program which includes automated exposure control, adjustment of the mA and/or kV according to patient size and/or use of iterative reconstruction technique. CONTRAST:  75mL OMNIPAQUE IOHEXOL 350 MG/ML SOLN COMPARISON:  None Available. FINDINGS: CTA NECK FINDINGS SKELETON: C4-6 ossification of the posterior longitudinal ligament with moderate to severe spinal canal stenosis. OTHER NECK: Normal pharynx, larynx and major salivary glands. No cervical lymphadenopathy. Unremarkable thyroid gland. UPPER CHEST: No pneumothorax or pleural effusion. No nodules or masses. AORTIC ARCH: There is calcific atherosclerosis of the aortic arch. There is no aneurysm, dissection or hemodynamically significant stenosis of the visualized portion of the aorta. Normal variant aortic arch branching pattern with the brachiocephalic and left common carotid arteries sharing a common origin. The visualized proximal subclavian arteries are widely patent. RIGHT CAROTID SYSTEM: No dissection, occlusion or aneurysm. Mild atherosclerotic calcification at the carotid bifurcation without hemodynamically significant stenosis. LEFT CAROTID SYSTEM: No dissection, occlusion or aneurysm. Mild atherosclerotic calcification at the carotid bifurcation without hemodynamically significant stenosis. VERTEBRAL ARTERIES: Left dominant configuration. Both origins are clearly patent. There is no dissection, occlusion or flow-limiting stenosis to the skull base (V1-V3 segments). CTA HEAD FINDINGS POSTERIOR CIRCULATION: --Vertebral arteries: Normal V4 segments. --Inferior cerebellar arteries: Normal. --Basilar artery: Normal. --Superior cerebellar arteries: Normal. --Posterior cerebral arteries (PCA): Normal. ANTERIOR CIRCULATION: --Intracranial internal carotid arteries: Normal. --Anterior cerebral arteries (ACA): Normal. Both A1 segments are present. Patent anterior communicating artery (a-comm). --Middle cerebral  arteries (MCA): Normal. VENOUS SINUSES: As permitted by contrast timing, patent. ANATOMIC VARIANTS: Fetal origins of both posterior cerebral arteries. Review of the MIP images confirms the above findings. IMPRESSION: 1. No emergent large vessel occlusion or high-grade stenosis of the intracranial arteries. 2. C4-6 ossification of the posterior longitudinal ligament with moderate to severe spinal canal stenosis. Aortic atherosclerosis (ICD10-I70.0). Electronically Signed   By: Deatra Robinson M.D.   On: 04/25/2022 02:02   CT HEAD CODE STROKE WO CONTRAST  Result Date: 04/24/2022 CLINICAL DATA:  Code stroke.  Acute neurologic deficit EXAM: CT HEAD WITHOUT CONTRAST TECHNIQUE: Contiguous axial images were obtained from the base of the skull through the vertex without intravenous contrast. RADIATION DOSE REDUCTION: This exam was performed according to the departmental dose-optimization program which includes automated exposure control, adjustment of the mA and/or kV according to patient size and/or use of iterative reconstruction technique. COMPARISON:  02/10/2022 FINDINGS: Brain: There is no mass, hemorrhage or extra-axial collection. There is generalized atrophy without lobar predilection. There is hypoattenuation of the periventricular white matter, most commonly indicating chronic ischemic microangiopathy. Vascular: No abnormal hyperdensity of the major intracranial arteries or dural venous sinuses. No intracranial atherosclerosis. Skull: The visualized skull base, calvarium and extracranial soft tissues are normal. Sinuses/Orbits: No fluid levels or advanced mucosal thickening of the visualized paranasal sinuses. No mastoid or middle ear effusion. The orbits are normal. ASPECTS (  [place] Stroke Program Early CT Score) - Ganglionic level infarction (caudate, lentiform nuclei, internal capsule, insula, M1-M3 cortex): 7 - Supraganglionic infarction (M4-M6 cortex): 3 Total score (0-10 with 10 being normal): 10  IMPRESSION: 1. No acute intracranial abnormality. 2. ASPECTS is 10. Electronically Signed   By: Deatra Robinson M.D.   On: 04/24/2022 23:08     Assessment and Plan:   Elevated troponin Hx minimal CAD, non-obstructive  Patient with hx mild plaque in mid segment of large dominant RCA ~20%, no obstructive disease in LAD or Lcx per 12/2020 LHC. Troponin this admission [phone]. ECG with sinus tachycardia, no acute ischemic changes appreciated.  Though troponin is elevated, overall lower suspicion for ACS etiology. No recent anginal symptoms or exertional intolerance per patient. Possibly demand ischemia in the setting of hypertension on admission.  Will repeat troponin x2 today and follow echocardiogram. Transition from warfarin to heparin to facilitate possible ischemic evaluation.  Chronic systolic CHF (recovered EF) Hypertension  Patient on GDMT of Metoprolol Succinate 50mg  QD, Entresto 24-26mg  BID, Spironolactone 25mg  QD, Farxiga 10mg  QD.   Hold anti-hypertensive agents to allow permissive HTN per neurology recs. Would continue with conservative approach at this time. Give troponin delta, will re-cycle and switch to Heparin from warfarin in order to facilitate LHC if troponin were to continue increasing.  Paroxysmal atrial fibrillation CHA2DS2-VASc Score = 6   Sinus rhythm/tachycardia this admission. Continue Warfarin. Would resume metoprolol when permissive hypertension no longer required to better manage HR.  Hx PE  Continue Warfarin this admission. Neurology feels benefit with PE history outweighs risk in setting of possible CVA.  Hyperlipidemia  Continue Atorvastatin 40mg .  Lab Results  Component Value Date   CHOL 118 04/25/2022   HDL 49 04/25/2022   LDLCALC 59 04/25/2022   TRIG 51 04/25/2022   CHOLHDL 2.4 04/25/2022     Per primary team: DM type II Right side weakness CKD IIIa Hypothyroidism  Risk Assessment/Risk Scores:     TIMI Risk Score for Unstable Angina  or Non-ST Elevation MI:   The patient's TIMI risk score is 3, which indicates a 13% risk of all cause mortality, new or recurrent myocardial infarction or need for urgent revascularization in the next 14 days.  New York Heart Association (NYHA) Functional Class NYHA Class I  CHA2DS2-VASc Score = 6   This indicates a 9.7% annual risk of stroke. The patient's score is based upon: CHF History: 1 HTN History: 1 Diabetes History: 1 Stroke History: 0 Vascular Disease History: 0 Age Score: 2 Gender Score: 1         For questions or updates, please contact [facility] Please consult [email or web address] for contact info under    Signed, Perlie Gold, PA-C  04/25/2022 10:59 AM

## 2022-04-25 NOTE — Progress Notes (Signed)
SLP Cancellation Note  Patient Details Name: Hannah [name] MRN: [id number] DOB: [birth year]   Cancelled treatment:       Reason Eval/Treat Not Completed: Medical issues which prohibited therapy. Elevated troponin and per RN, patient likely transferring to [facility].   Angela Nevin, MA, CCC-SLP Speech Therapy

## 2022-04-25 NOTE — Progress Notes (Signed)
  Echocardiogram 2D Echocardiogram has been performed.  Hannah [name] 04/25/2022, 3:43 PM

## 2022-04-25 NOTE — Assessment & Plan Note (Signed)
-  secondary to hx of large bowel stricture, s/p colectomy  -daily colostomy care

## 2022-04-25 NOTE — ED Notes (Signed)
ED TO INPATIENT HANDOFF REPORT  Name/Age/Gender Hannah [name] 82 y.o. female  Code Status    Code Status Orders  (From admission, onward)           Start     Ordered   04/25/22 0109  Full code  Continuous       Question:  By:  Answer:  Consent: discussion documented in EHR   04/25/22 0110           Code Status History     Date Active Date Inactive Code Status Order ID Comments User Context   12/25/2020 1618 01/04/2021 1944 Full Code [id number]  Orland Mustard, MD ED   04/23/2020 0052 04/26/2020 1641 Full Code [id number]  Briscoe Deutscher, MD ED   12/15/2018 0742 12/16/2018 2115 Full Code [id number]  Clydie Braun, MD ED   10/31/2018 0226 11/02/2018 2048 Full Code [id number]  Eduard Clos, MD ED   12/15/2017 2144 12/18/2017 1900 Full Code [id number]  Charlsie Quest, MD ED   12/07/2017 1622 12/08/2017 1853 Full Code [id number]  Burnadette Pop, MD ED   03/15/2017 0258 03/19/2017 2151 Full Code [id number]  Briscoe Deutscher, MD ED   03/10/2016 1344 03/14/2016 1316 Full Code [id number]  Ozella Rocks, MD ED   05/29/2014 1728 06/16/2014 2154 Full Code [id number]  Russella Dar, NP Inpatient   08/05/2012 1229 08/08/2012 1626 Full Code [id number]  Leroy Sea, MD Inpatient   07/16/2012 1928 07/26/2012 1911 Full Code [id number]  Storm Frisk, MD Inpatient       Home/SNF/Other Home  Chief Complaint Lower extremity weakness [R29.898]  Level of Care/Admitting Diagnosis ED Disposition     ED Disposition  Admit   Condition  --   Comment  Hospital Area: [facility] [[id number]]  Level of Care: Telemetry [5]  Admit to tele based on following criteria: Other see comments  Comments: stroke workup  May place patient in observation at [facility] or [facility] if equivalent level of care is available:: No  Covid Evaluation: Asymptomatic - no recent exposure (last 10 days) testing not required  Diagnosis: Lower extremity weakness [932355]  Admitting Physician:  Anselm Jungling [[id number]]  Attending Physician: Anselm Jungling [[id number]]          Medical History Past Medical History:  Diagnosis Date   Acute massive pulmonary embolism (HCC) 07/13/2012   Massive PE w/ PEA arrest 07/13/12 >TNK >IVC filter >discharged on comadin     Anticoagulated on warfarin    Chronic diastolic CHF (congestive heart failure) (HCC) 03/15/2017   Chronic kidney disease, stage 3a (HCC) 12/15/2018   Diabetes mellitus, type 2 (HCC) 07/15/2012   with peripheral neuropathy   DM2 (diabetes mellitus, type 2) (HCC) 07/23/2012   HFrEF (heart failure with reduced ejection fraction) (HCC) 03/15/2017   Non-ischemic cardiomyopathy // Cardiac catheterization 01/01/21:  RCA prox to mid 20 // Echocardiogram 12/26/20:  EF 30-35, ant septum/apex, mid inf-sept AK; ant-lat/ant/inf HK, Gr 1 DD, mild LVH, normal RVSF, RVSP 27.2, small effusion (ant to RV), mild MR, mod TR, AV sclerosis w/o AS   Hyperlipemia 07/14/2012   Hyperlipidemia 02/25/2013   Hypertension 07/14/2012   Hypothyroid 06/06/2013   Large bowel stricture (HCC)    s/p colectomy in 2016   Neuropathy 02/12/2015   Osteoarthritis    s/p hip and knee replacements   PAF (paroxysmal atrial fibrillation) (HCC)    Pulmonary hypertension (HCC) 07/26/2016   Syncope 10/2018   Thyroid disease  Allergies Allergies  Allergen Reactions   Augmentin [Amoxicillin-Pot Clavulanate] Itching and Other (See Comments)    Severe vaginal itching   Tranxene [Clorazepate] Itching   Crestor [Rosuvastatin]     Made her sick on stomach, she is able to tolerate the zocor   Penicillins Itching and Rash    Has patient had a PCN reaction causing immediate rash, facial/tongue/throat swelling, SOB or lightheadedness with hypotension: Yes Has patient had a PCN reaction causing severe rash involving mucus membranes or skin necrosis: No Has patient had a PCN reaction that required hospitalization: No Has patient had a PCN reaction occurring within the last 10 years:  No If all of the above answers are "NO", then may proceed with Cephalosporin use.     IV Location/Drains/Wounds Patient Lines/Drains/Airways Status     Active Line/Drains/Airways     Name Placement date Placement time Site Days   Peripheral IV 04/24/22 20 G Distal;Posterior;Right Forearm 04/24/22  2322  Forearm  1   Peripheral IV 04/24/22 20 G Right Antecubital 04/24/22  2322  Antecubital  1   Colostomy LUQ 06/03/14  --  LUQ  2883            Labs/Imaging Results for orders placed or performed during the hospital encounter of 04/24/22 (from the past 48 hour(s))  Ethanol     Status: None   Collection Time: 04/24/22 11:21 PM  Result Value Ref Range   Alcohol, Ethyl (B) <10 <10 mg/dL    Comment: (NOTE) Lowest detectable limit for serum alcohol is 10 mg/dL.  For medical purposes only. Performed at [facility], [address], [place], [place] [address]   Protime-INR     Status: Abnormal   Collection Time: 04/24/22 11:21 PM  Result Value Ref Range   Prothrombin Time 30.6 (H) 11.4 - 15.2 seconds   INR 3.0 (H) 0.8 - 1.2    Comment: (NOTE) INR goal varies based on device and disease states. Performed at [facility], [address], [place], [place] [address]   APTT     Status: Abnormal   Collection Time: 04/24/22 11:21 PM  Result Value Ref Range   aPTT 46 (H) 24 - 36 seconds    Comment:        IF BASELINE aPTT IS ELEVATED, SUGGEST PATIENT RISK ASSESSMENT BE USED TO DETERMINE APPROPRIATE ANTICOAGULANT THERAPY. Performed at [facility], [address], [place], [place] [address]   CBC     Status: Abnormal   Collection Time: 04/24/22 11:21 PM  Result Value Ref Range   WBC 11.1 (H) 4.0 - 10.5 K/uL   RBC 5.88 (H) 3.87 - 5.11 MIL/uL   Hemoglobin 15.5 (H) 12.0 - 15.0 g/dL   HCT 91.4 (H) 78.2 - 95.6 %   MCV 85.7 80.0 - 100.0 fL   MCH 26.4 26.0 - 34.0 pg   MCHC 30.8 30.0 - 36.0 g/dL   RDW 21.3 (H) 08.6 - 57.8 %    Platelets 261 150 - 400 K/uL   nRBC 0.0 0.0 - 0.2 %    Comment: Performed at [facility], [address], [place], [place] [address]  Differential     Status: Abnormal   Collection Time: 04/24/22 11:21 PM  Result Value Ref Range   Neutrophils Relative % 70 %   Neutro Abs 8.0 (H) 1.7 - 7.7 K/uL   Lymphocytes Relative 21 %   Lymphs Abs 2.3 0.7 - 4.0 K/uL   Monocytes Relative  6 %   Monocytes Absolute 0.7 0.1 - 1.0 K/uL   Eosinophils Relative 1 %   Eosinophils Absolute 0.1 0.0 - 0.5 K/uL   Basophils Relative 1 %   Basophils Absolute 0.1 0.0 - 0.1 K/uL   Immature Granulocytes 1 %   Abs Immature Granulocytes 0.05 0.00 - 0.07 K/uL    Comment: Performed at [facility], [address], [place], [place] [address]  Comprehensive metabolic panel     Status: Abnormal   Collection Time: 04/24/22 11:21 PM  Result Value Ref Range   Sodium 139 135 - 145 mmol/L   Potassium 4.1 3.5 - 5.1 mmol/L   Chloride 106 98 - 111 mmol/L   CO2 19 (L) 22 - 32 mmol/L   Glucose, Bld 122 (H) 70 - 99 mg/dL    Comment: Glucose reference range applies only to samples taken after fasting for at least 8 hours.   BUN 26 (H) 8 - 23 mg/dL   Creatinine, Ser 6.57 (H) 0.44 - 1.00 mg/dL   Calcium 9.0 8.9 - 84.6 mg/dL   Total Protein 7.7 6.5 - 8.1 g/dL   Albumin 3.8 3.5 - 5.0 g/dL   AST 22 15 - 41 U/L   ALT 14 0 - 44 U/L   Alkaline Phosphatase 69 38 - 126 U/L   Total Bilirubin 1.0 0.3 - 1.2 mg/dL   GFR, Estimated 40 (L) >60 mL/min    Comment: (NOTE) Calculated using the CKD-EPI Creatinine Equation (2021)    Anion gap 14 5 - 15    Comment: Performed at [facility], [address], [place], [place] [address]  Urine rapid drug screen (hosp performed)     Status: None   Collection Time: 04/24/22 11:21 PM  Result Value Ref Range   Opiates NONE DETECTED NONE DETECTED   Cocaine NONE DETECTED NONE DETECTED   Benzodiazepines NONE DETECTED NONE DETECTED    Amphetamines NONE DETECTED NONE DETECTED   Tetrahydrocannabinol NONE DETECTED NONE DETECTED   Barbiturates NONE DETECTED NONE DETECTED    Comment: (NOTE) DRUG SCREEN FOR MEDICAL PURPOSES ONLY.  IF CONFIRMATION IS NEEDED FOR ANY PURPOSE, NOTIFY LAB WITHIN 5 DAYS.  LOWEST DETECTABLE LIMITS FOR URINE DRUG SCREEN Drug Class                     Cutoff (ng/mL) Amphetamine and metabolites    1000 Barbiturate and metabolites    200 Benzodiazepine                 200 Opiates and metabolites        300 Cocaine and metabolites        300 THC                            50 Performed at [facility], [address] [place], [place] [address]   Dickie La 8, ED     Status: Abnormal   Collection Time: 04/24/22 11:36 PM  Result Value Ref Range   Sodium 139 135 - 145 mmol/L   Potassium 4.4 3.5 - 5.1 mmol/L   Chloride 109 98 - 111 mmol/L   BUN 35 (H) 8 - 23 mg/dL   Creatinine, Ser 2.44 (H) 0.44 - 1.00 mg/dL   Glucose, Bld 010 (H) 70 - 99 mg/dL    Comment: Glucose reference range applies only to samples taken after fasting for at least 8 hours.  Calcium, Ion 1.04 (L) 1.15 - 1.40 mmol/L   TCO2 24 22 - 32 mmol/L   Hemoglobin 17.7 (H) 12.0 - 15.0 g/dL   HCT 16.1 (H) 09.6 - 04.5 %  Urinalysis, Routine w reflex microscopic -Urine, Clean Catch     Status: Abnormal   Collection Time: 04/25/22 12:44 AM  Result Value Ref Range   Color, Urine YELLOW YELLOW   APPearance CLEAR CLEAR   Specific Gravity, Urine 1.026 1.005 - 1.030   pH 5.0 5.0 - 8.0   Glucose, UA >=500 (A) NEGATIVE mg/dL   Hgb urine dipstick SMALL (A) NEGATIVE   Bilirubin Urine NEGATIVE NEGATIVE   Ketones, ur 5 (A) NEGATIVE mg/dL   Protein, ur 30 (A) NEGATIVE mg/dL   Nitrite NEGATIVE NEGATIVE   Leukocytes,Ua NEGATIVE NEGATIVE   RBC / HPF 0-5 0 - 5 RBC/hpf   WBC, UA 0-5 0 - 5 WBC/hpf   Bacteria, UA NONE SEEN NONE SEEN   Squamous Epithelial / HPF 0-5 0 - 5 /HPF   Mucus PRESENT     Comment: Performed at [facility], [address] [place], [place] [address]   CT HEAD CODE STROKE WO CONTRAST  Result Date: 04/24/2022 CLINICAL DATA:  Code stroke.  Acute neurologic deficit EXAM: CT HEAD WITHOUT CONTRAST TECHNIQUE: Contiguous axial images were obtained from the base of the skull through the vertex without intravenous contrast. RADIATION DOSE REDUCTION: This exam was performed according to the departmental dose-optimization program which includes automated exposure control, adjustment of the mA and/or kV according to patient size and/or use of iterative reconstruction technique. COMPARISON:  02/10/2022 FINDINGS: Brain: There is no mass, hemorrhage or extra-axial collection. There is generalized atrophy without lobar predilection. There is hypoattenuation of the periventricular white matter, most commonly indicating chronic ischemic microangiopathy. Vascular: No abnormal hyperdensity of the major intracranial arteries or dural venous sinuses. No intracranial atherosclerosis. Skull: The visualized skull base, calvarium and extracranial soft tissues are normal. Sinuses/Orbits: No fluid levels or advanced mucosal thickening of the visualized paranasal sinuses. No mastoid or middle ear effusion. The orbits are normal. ASPECTS [facility] Stroke Program Early CT Score) - Ganglionic level infarction (caudate, lentiform nuclei, internal capsule, insula, M1-M3 cortex): 7 - Supraganglionic infarction (M4-M6 cortex): 3 Total score (0-10 with 10 being normal): 10 IMPRESSION: 1. No acute intracranial abnormality. 2. ASPECTS is 10. Electronically Signed   By: Deatra Robinson M.D.   On: 04/24/2022 23:08    Pending Labs Unresulted Labs (From admission, onward)     Start     Ordered   04/26/22 0500  Protime-INR  Daily,   R      04/25/22 0139   04/25/22 0500  Lipid panel  (Labs)  Tomorrow morning,   R       Comments: Fasting    04/25/22 0110   04/25/22 0109  Hemoglobin A1c  (Labs)  Once,   R       Comments: To  assess prior glycemic control    04/25/22 0110            Vitals/Pain Today's Vitals   04/24/22 2245 04/24/22 2300 04/24/22 2315 04/25/22 0200  BP: (!) 178/95 (!) 187/91 (!) 183/91 (!) 159/103  Pulse: 88 99 (!) 109 (!) 105  Resp: 19 (!) Temp:    97.6 F (36.4 C)  TempSrc:    Axillary  SpO2: 98% 100% 98% 99%  PainSc:        Isolation Precautions No active isolations  Medications Medications   stroke: early stages of recovery book (has no administration in time range)  acetaminophen (TYLENOL) tablet 650 mg (has no administration in time range)    Or  acetaminophen (TYLENOL) 160 MG/5ML solution 650 mg (has no administration in time range)    Or  acetaminophen (TYLENOL) suppository 650 mg (has no administration in time range)  hydrOXYzine (ATARAX) tablet 25 mg (has no administration in time range)  atorvastatin (LIPITOR) tablet 40 mg (has no administration in time range)  levothyroxine (SYNTHROID) tablet 25 mcg (has no administration in time range)  warfarin (COUMADIN) tablet 2.5 mg (has no administration in time range)  Warfarin - Pharmacist Dosing Inpatient (has no administration in time range)  iohexol (OMNIPAQUE) 350 MG/ML injection 75 mL (75 mLs Intravenous Contrast Given 04/25/22 0149)    Mobility walks with device

## 2022-04-25 NOTE — Hospital Course (Signed)
82 y.o. female with medical history significant of pulmonary hypertension, CAD, hypertension, paroxysmal atrial fibrillation, PE on Coumadin, type 2 diabetes, CKD 3A, hypothyroidism and hyperlipidemia who presents with right lower extremity weakness.   Patient believes symptoms happened around 8pm. She ambulates with walker at baseline. She was able to go down stairs at her daughter's home. Then had to go up a few stairs into her own home but could not get her right leg to lift up even with assistance from family members. Denies any headache or vision. Feels mid-chest tightness since being in ED but thinks due to anxiety. Also has notable right UE intention tremor she reports started 2 days ago.    In the ED, she was afebrile with elevated blood pressure of up to 180s over 90s on room air.   Code stroke was initiated in the ED. Pt was admitted for stroke work up

## 2022-04-25 NOTE — Assessment & Plan Note (Signed)
-  hold home antihypertensive to allow for 24 hrs permissive HTN

## 2022-04-25 NOTE — Progress Notes (Signed)
ANTICOAGULATION CONSULT NOTE - Initial Consult  Pharmacy Consult for warfarin  Indication: pulmonary embolus  Allergies  Allergen Reactions   Augmentin [Amoxicillin-Pot Clavulanate] Itching and Other (See Comments)    Severe vaginal itching   Tranxene [Clorazepate] Itching   Crestor [Rosuvastatin]     Made her sick on stomach, she is able to tolerate the zocor   Penicillins Itching and Rash    Has patient had a PCN reaction causing immediate rash, facial/tongue/throat swelling, SOB or lightheadedness with hypotension: Yes Has patient had a PCN reaction causing severe rash involving mucus membranes or skin necrosis: No Has patient had a PCN reaction that required hospitalization: No Has patient had a PCN reaction occurring within the last 10 years: No If all of the above answers are "NO", then may proceed with Cephalosporin use.     Patient Measurements:   Heparin Dosing Weight:   Vital Signs: Temp: 97.6 F (36.4 C) (04/21 2153) BP: 183/91 (04/21 2315) Pulse Rate: 109 (04/21 2315)  Labs: Recent Labs    04/24/22 2321 04/24/22 2336  HGB 15.5* 17.7*  HCT 50.4* 52.0*  PLT 261  --   APTT 46*  --   LABPROT 30.6*  --   INR 3.0*  --   CREATININE 1.33* 1.30*    CrCl cannot be calculated (Unknown ideal weight.).   Medical History: Past Medical History:  Diagnosis Date   Acute massive pulmonary embolism (HCC) 07/13/2012   Massive PE w/ PEA arrest 07/13/12 >TNK >IVC filter >discharged on comadin     Anticoagulated on warfarin    Chronic diastolic CHF (congestive heart failure) (HCC) 03/15/2017   Chronic kidney disease, stage 3a (HCC) 12/15/2018   Diabetes mellitus, type 2 (HCC) 07/15/2012   with peripheral neuropathy   DM2 (diabetes mellitus, type 2) (HCC) 07/23/2012   HFrEF (heart failure with reduced ejection fraction) (HCC) 03/15/2017   Non-ischemic cardiomyopathy // Cardiac catheterization 01/01/21:  RCA prox to mid 20 // Echocardiogram 12/26/20:  EF 30-35, ant  septum/apex, mid inf-sept AK; ant-lat/ant/inf HK, Gr 1 DD, mild LVH, normal RVSF, RVSP 27.2, small effusion (ant to RV), mild MR, mod TR, AV sclerosis w/o AS   Hyperlipemia 07/14/2012   Hyperlipidemia 02/25/2013   Hypertension 07/14/2012   Hypothyroid 06/06/2013   Large bowel stricture (HCC)    s/p colectomy in 2016   Neuropathy 02/12/2015   Osteoarthritis    s/p hip and knee replacements   PAF (paroxysmal atrial fibrillation) (HCC)    Pulmonary hypertension (HCC) 07/26/2016   Syncope 10/2018   Thyroid disease     Medications:  Per anti-coag visit note on 04/13/22 Warfarin  every Wed and Friday, 2.5mg  all other days  Assessment: 82 y.o. female with medical history significant of pulmonary hypertension, CAD, hypertension, paroxysmal atrial fibrillation, PE on Coumadin, type 2 diabetes, CKD 3A, hypothyroidism and hyperlipidemia who presents with right lower extremity weakness. Code stroke was initiated in the ED.  She was evaluated by teleneurology. Deemed not a candidate for thrombolytics Pharmacy to dose warfarin.  INR 3, Hgb 17.7, plts 261, Scr 1.3  Goal of Therapy:  INR 2.5-3.5 (per anti-coag visit note)    Plan:  Warfarin 2.5mg  now Daily INR  Arley Phenix RPh 04/25/2022, 1:36 AM

## 2022-04-25 NOTE — Assessment & Plan Note (Addendum)
-  reports chest tightness but she attributes it potentially to anxiety -EKG with IVCD but no ST changes -will obtain troponin -LHC in 2022 with non-obstructive disease of LAD OR CIRCUMFLEX. Mild plaque to large, dominant RCA.  -Doubt symptoms are cardiac related- give a dose of hydroxyzine for anxiety

## 2022-04-25 NOTE — ED Notes (Signed)
Patient transported to floor at this time.

## 2022-04-25 NOTE — Assessment & Plan Note (Signed)
-  on warfarin -holding beta-blocker for permissive HTN

## 2022-04-25 NOTE — Progress Notes (Signed)
OT Cancellation Note  Patient Details Name: Hannah [name] MRN: [id number] DOB: June 15, 1940   Cancelled Treatment:    Reason Eval/Treat Not Completed: Medical issues which prohibited therapy: Per chart and discussion with physical therapy: Critical troponin received from the lab of 1645. Pt may transfer to [facility]. Will continue to monitor.    Theodoro Clock 04/25/2022, 10:08 AM

## 2022-04-26 DIAGNOSIS — R2990 Unspecified symptoms and signs involving the nervous system: Secondary | ICD-10-CM | POA: Diagnosis not present

## 2022-04-26 DIAGNOSIS — I5189 Other ill-defined heart diseases: Secondary | ICD-10-CM | POA: Diagnosis not present

## 2022-04-26 DIAGNOSIS — I480 Paroxysmal atrial fibrillation: Secondary | ICD-10-CM | POA: Diagnosis not present

## 2022-04-26 DIAGNOSIS — R7989 Other specified abnormal findings of blood chemistry: Secondary | ICD-10-CM

## 2022-04-26 DIAGNOSIS — Z86711 Personal history of pulmonary embolism: Secondary | ICD-10-CM | POA: Diagnosis not present

## 2022-04-26 DIAGNOSIS — R531 Weakness: Secondary | ICD-10-CM | POA: Diagnosis not present

## 2022-04-26 LAB — COMPREHENSIVE METABOLIC PANEL
ALT: 11 U/L (ref 0–44)
AST: 20 U/L (ref 15–41)
Albumin: 3 g/dL — ABNORMAL LOW (ref 3.5–5.0)
Alkaline Phosphatase: 51 U/L (ref 38–126)
Anion gap: 11 (ref 5–15)
BUN: 33 mg/dL — ABNORMAL HIGH (ref 8–23)
CO2: 23 mmol/L (ref 22–32)
Calcium: 8.4 mg/dL — ABNORMAL LOW (ref 8.9–10.3)
Chloride: 103 mmol/L (ref 98–111)
Creatinine, Ser: 1.58 mg/dL — ABNORMAL HIGH (ref 0.44–1.00)
GFR, Estimated: 33 mL/min — ABNORMAL LOW (ref >60)
Glucose, Bld: 115 mg/dL — ABNORMAL HIGH (ref 70–99)
Potassium: 3.7 mmol/L (ref 3.5–5.1)
Sodium: 137 mmol/L (ref 135–145)
Total Bilirubin: 1.1 mg/dL (ref 0.3–1.2)
Total Protein: 6 g/dL — ABNORMAL LOW (ref 6.5–8.1)

## 2022-04-26 LAB — CBC
HCT: 44.3 % (ref 36.0–46.0)
Hemoglobin: 13.7 g/dL (ref 12.0–15.0)
MCH: 26.6 pg (ref 26.0–34.0)
MCHC: 30.9 g/dL (ref 30.0–36.0)
MCV: 86 fL (ref 80.0–100.0)
Platelets: 224 10*3/uL (ref 150–400)
RBC: 5.15 MIL/uL — ABNORMAL HIGH (ref 3.87–5.11)
RDW: 19.1 % — ABNORMAL HIGH (ref 11.5–15.5)
WBC: 8.9 10*3/uL (ref 4.0–10.5)
nRBC: 0 % (ref 0.0–0.2)

## 2022-04-26 LAB — PROTIME-INR
INR: 3.8 — ABNORMAL HIGH (ref 0.8–1.2)
Prothrombin Time: 37.2 seconds — ABNORMAL HIGH (ref 11.4–15.2)

## 2022-04-26 MED ORDER — ASPIRIN 81 MG PO TBEC
81.0000 mg | DELAYED_RELEASE_TABLET | Freq: Every day | ORAL | Status: DC
  Administered 2022-04-26 – 2022-04-29 (×4): 81 mg via ORAL
  Filled 2022-04-26 (×6): qty 1

## 2022-04-26 MED ORDER — METOPROLOL TARTRATE 12.5 MG HALF TABLET
12.5000 mg | ORAL_TABLET | Freq: Two times a day (BID) | ORAL | Status: DC
  Administered 2022-04-26 – 2022-04-30 (×8): 12.5 mg via ORAL
  Filled 2022-04-26 (×9): qty 1

## 2022-04-26 NOTE — Progress Notes (Signed)
Rounding Note    Patient Name: Hannah [name] Date of Encounter: 04/26/2022  [facility] Cardiologist: Dietrich Pates, MD   Subjective   Patient appears comfortable in bed this morning. Says that overall she feels about the same today as yesterday. Denies chest pain or shortness of breath. Discussed results of yesterday's echocardiogram with patient and plans to let INR drift down prior to [facility].  Inpatient Medications    Scheduled Meds:   stroke: early stages of recovery book   Does not apply Once   atorvastatin  40 mg Oral Daily   levothyroxine  25 mcg Oral Q0600   Continuous Infusions:  PRN Meds: acetaminophen **OR** acetaminophen (TYLENOL) oral liquid 160 mg/5 mL **OR** acetaminophen, morphine injection, nitroGLYCERIN   Vital Signs    Vitals:   04/25/22 1800 04/25/22 1952 04/26/22 0048 04/26/22 0418  BP:  104/67 (!) 107/58 98/67  Pulse:  96 89 85  Resp:  16 16 17   Temp:  98.1 F (36.7 C) 98.6 F (37 C) 98.1 F (36.7 C)  TempSrc:  Oral Oral Oral  SpO2:  98% 99% 99%  Weight: 93.8 kg     Height: 5\' 7"  (1.702 m)       Intake/Output Summary (Last 24 hours) at 04/26/2022 0805 Last data filed at 04/26/2022 0000 Gross per 24 hour  Intake 626 ml  Output 650 ml  Net -24 ml      04/25/2022    6:00 PM 04/25/2022    2:49 AM 12/15/2021    2:53 PM  Last 3 Weights  Weight (lbs) 206 lb 12.7 oz 195 lb 9.6 oz 198 lb 12.8 oz  Weight (kg) 93.8 kg 88.724 kg 90.175 kg      Telemetry    Sinus rhythm - Personally Reviewed  ECG    No new tracing - Personally Reviewed  Physical Exam   GEN: No acute distress.   Neck: No JVD Cardiac: RRR, no murmurs, rubs, or gallops.  Respiratory: Clear to auscultation bilaterally. GI: Soft, nontender, non-distended  MS: No edema; No deformity. Neuro:  Nonfocal  Psych: Normal affect   Labs    High Sensitivity Troponin:   Recent Labs  Lab 04/25/22 0549 04/25/22 0840 04/25/22 1024 04/25/22 1216  TROPONINIHS 1,645* 1,869*  1,919* 2,278*     Chemistry Recent Labs  Lab 04/24/22 2321 04/24/22 2336 04/26/22 0501  NA 139 139 137  K 4.1 4.4 3.7  CL 106 109 103  CO2 19*  --  23  GLUCOSE 122* 122* 115*  BUN 26* 35* 33*  CREATININE 1.33* 1.30* 1.58*  CALCIUM 9.0  --  8.4*  PROT 7.7  --  6.0*  ALBUMIN 3.8  --  3.0*  AST 22  --  20  ALT 14  --  11  ALKPHOS 69  --  51  BILITOT 1.0  --  1.1  GFRNONAA 40*  --  33*  ANIONGAP 14  --  11    Lipids  Recent Labs  Lab 04/25/22 0549  CHOL 118  TRIG 51  HDL 49  LDLCALC 59  CHOLHDL 2.4    Hematology Recent Labs  Lab 04/24/22 2321 04/24/22 2336 04/26/22 0501  WBC 11.1*  --  8.9  RBC 5.88*  --  5.15*  HGB 15.5* 17.7* 13.7  HCT 50.4* 52.0* 44.3  MCV 85.7  --  86.0  MCH 26.4  --  26.6  MCHC 30.8  --  30.9  RDW 20.0*  --  19.1*  PLT  261  --  224   Thyroid No results for input(s): "TSH", "FREET4" in the last 168 hours.  BNPNo results for input(s): "BNP", "PROBNP" in the last 168 hours.  DDimer No results for input(s): "DDIMER" in the last 168 hours.   Radiology    ECHOCARDIOGRAM COMPLETE  Result Date: 04/25/2022    ECHOCARDIOGRAM REPORT   Patient Name:   Hannah [name] Date of Exam: 04/25/2022 Medical Rec #:  [id number]    Height:       67.0 in Accession #:    [id number]   Weight:       195.6 lb Date of Birth:  [birth year]    BSA:          2.003 m Patient Age:    81 years     BP:           126/75 mmHg Patient Gender: F            HR:           102 bpm. Exam Location:  Inpatient Procedure: 2D Echo, Color Doppler, Cardiac Doppler and Intracardiac            Opacification Agent REPORT CONTAINS CRITICAL RESULT Indications:    Stroke  History:        Patient has prior history of Echocardiogram examinations, most                 recent 05/24/2021. HFrEF and CHF, Pulmonary HTN,                 Arrythmias:Atrial Fibrillation, Signs/Symptoms:Syncope; Risk                 Factors:Hypertension, Dyslipidemia and Diabetes. CKD.  Sonographer:    Milda Smart Referring  Phys: [id number] CHING T TU  Sonographer Comments: Technically difficult study due to poor echo windows. Image acquisition challenging due to patient body habitus and Image acquisition challenging due to respiratory motion. IMPRESSIONS  1. Left ventricular ejection fraction, by estimation, is 30%. The left ventricle has moderately decreased function. The left ventricle demonstrates regional wall motion abnormalities with hyperkinetic basal LV segments and akinesis of the mid to apical anteroseptal, mid-apical inferoseptal, apical lateral, apical anterior, and mid-apical inferior walls as well as the true apex. With hyperdynamic basal LV contraction, there is mitral valve systolic anterior motion with LV outflow tract gradient, peak 45  mmHg. These findings are consistent with large wrap-around LAD territory MI or a stress (Takotsubo-type) cardiomyopathy. There is mild left ventricular hypertrophy. Left ventricular diastolic parameters are consistent with Grade I diastolic dysfunction (impaired relaxation).  2. Right ventricular systolic function is normal. The right ventricular size is normal. There is normal pulmonary artery systolic pressure. The estimated right ventricular systolic pressure is 30.2 mmHg.  3. There was systolic anterior motion of the anterior mitral leaflet. The mitral valve is abnormal. Mild to moderate mitral valve regurgitation. No evidence of mitral stenosis.  4. The aortic valve is tricuspid. There is mild calcification of the aortic valve. Aortic valve regurgitation is not visualized. No aortic stenosis is present.  5. The inferior vena cava is normal in size with greater than 50% respiratory variability, suggesting right atrial pressure of 3 mmHg. FINDINGS  Left Ventricle: Left ventricular ejection fraction, by estimation, is 30%. The left ventricle has moderately decreased function. The left ventricle demonstrates regional wall motion abnormalities. Definity contrast agent was given IV to  delineate the left ventricular endocardial borders. The left ventricular internal cavity size was normal  in size. There is mild left ventricular hypertrophy. Left ventricular diastolic parameters are consistent with Grade I diastolic dysfunction (impaired relaxation). Right Ventricle: The right ventricular size is normal. No increase in right ventricular wall thickness. Right ventricular systolic function is normal. There is normal pulmonary artery systolic pressure. The tricuspid regurgitant velocity is 2.61 m/s, and  with an assumed right atrial pressure of 3 mmHg, the estimated right ventricular systolic pressure is 30.2 mmHg. Left Atrium: Left atrial size was normal in size. Right Atrium: Right atrial size was normal in size. Pericardium: There is no evidence of pericardial effusion. Mitral Valve: There was systolic anterior motion of the anterior mitral leaflet. The mitral valve is abnormal. Mild to moderate mitral annular calcification. Mild to moderate mitral valve regurgitation. No evidence of mitral valve stenosis. Tricuspid Valve: The tricuspid valve is normal in structure. Tricuspid valve regurgitation is trivial. Aortic Valve: The aortic valve is tricuspid. There is mild calcification of the aortic valve. Aortic valve regurgitation is not visualized. No aortic stenosis is present. Pulmonic Valve: The pulmonic valve was normal in structure. Pulmonic valve regurgitation is not visualized. Aorta: The aortic root is normal in size and structure. Venous: The inferior vena cava is normal in size with greater than 50% respiratory variability, suggesting right atrial pressure of 3 mmHg. IAS/Shunts: No atrial level shunt detected by color flow Doppler.  LEFT VENTRICLE PLAX 2D LVIDd:         3.00 cm LVIDs:         2.10 cm LV PW:         1.20 cm LV IVS:        1.20 cm LVOT diam:     2.00 cm LVOT Area:     3.14 cm  LEFT ATRIUM             Index        RIGHT ATRIUM          Index LA diam:        3.10 cm 1.55 cm/m    RA Area:     8.57 cm LA Vol (A2C):   30.6 ml 15.28 ml/m  RA Volume:   13.30 ml 6.64 ml/m LA Vol (A4C):   16.8 ml 8.39 ml/m LA Biplane Vol: 23.6 ml 11.78 ml/m   AORTA Ao Root diam: 2.80 cm Ao Asc diam:  2.90 cm TRICUSPID VALVE TR Peak grad:   27.2 mmHg TR Mean grad:   15.0 mmHg TR Vmax:        261.00 cm/s TR Vmean:       189.0 cm/s  SHUNTS Systemic Diam: 2.00 cm Dalton McleanMD Electronically signed by Wilfred Lacy Signature Date/Time: 04/25/2022/4:12:17 PM    Final    MR BRAIN WO CONTRAST  Result Date: 04/25/2022 CLINICAL DATA:  Neuro deficit, acute, stroke suspected right upper and lower extremity weakness, right facial droop EXAM: MRI HEAD WITHOUT CONTRAST TECHNIQUE: Multiplanar, multiecho pulse sequences of the brain and surrounding structures were obtained without intravenous contrast. COMPARISON:  MRI October 31, 2018.  CT head April 24, 2022. FINDINGS: Brain: No acute infarction, hemorrhage, hydrocephalus, extra-axial collection or mass lesion. Cerebral atrophy with probably ex vacuo ventricular dilation. Mild for age T2/FLAIR hyperintensities in the white matter, compatible with chronic microvascular ischemic disease. Vascular: Major arterial flow voids maintained at the skull base. Skull and upper cervical spine: Normal marrow signal. Sinuses/Orbits: Largely clear sinuses.  No acute orbital findings. Other: No mastoid effusions. IMPRESSION: 1. No evidence of acute intracranial abnormality.  2. Cerebral Atrophy (ICD10-G31.9) with probably ex vacuo ventricular dilation. Electronically Signed   By: Feliberto Harts M.D.   On: 04/25/2022 12:09   CT ANGIO HEAD NECK W WO CM  Result Date: 04/25/2022 CLINICAL DATA:  Acute neurologic deficit EXAM: CT ANGIOGRAPHY HEAD AND NECK WITH AND WITHOUT CONTRAST TECHNIQUE: Multidetector CT imaging of the head and neck was performed using the standard protocol during bolus administration of intravenous contrast. Multiplanar CT image reconstructions and MIPs were  obtained to evaluate the vascular anatomy. Carotid stenosis measurements (when applicable) are obtained utilizing NASCET criteria, using the distal internal carotid diameter as the denominator. RADIATION DOSE REDUCTION: This exam was performed according to the departmental dose-optimization program which includes automated exposure control, adjustment of the mA and/or kV according to patient size and/or use of iterative reconstruction technique. CONTRAST:  75mL OMNIPAQUE IOHEXOL 350 MG/ML SOLN COMPARISON:  None Available. FINDINGS: CTA NECK FINDINGS SKELETON: C4-6 ossification of the posterior longitudinal ligament with moderate to severe spinal canal stenosis. OTHER NECK: Normal pharynx, larynx and major salivary glands. No cervical lymphadenopathy. Unremarkable thyroid gland. UPPER CHEST: No pneumothorax or pleural effusion. No nodules or masses. AORTIC ARCH: There is calcific atherosclerosis of the aortic arch. There is no aneurysm, dissection or hemodynamically significant stenosis of the visualized portion of the aorta. Normal variant aortic arch branching pattern with the brachiocephalic and left common carotid arteries sharing a common origin. The visualized proximal subclavian arteries are widely patent. RIGHT CAROTID SYSTEM: No dissection, occlusion or aneurysm. Mild atherosclerotic calcification at the carotid bifurcation without hemodynamically significant stenosis. LEFT CAROTID SYSTEM: No dissection, occlusion or aneurysm. Mild atherosclerotic calcification at the carotid bifurcation without hemodynamically significant stenosis. VERTEBRAL ARTERIES: Left dominant configuration. Both origins are clearly patent. There is no dissection, occlusion or flow-limiting stenosis to the skull base (V1-V3 segments). CTA HEAD FINDINGS POSTERIOR CIRCULATION: --Vertebral arteries: Normal V4 segments. --Inferior cerebellar arteries: Normal. --Basilar artery: Normal. --Superior cerebellar arteries: Normal. --Posterior  cerebral arteries (PCA): Normal. ANTERIOR CIRCULATION: --Intracranial internal carotid arteries: Normal. --Anterior cerebral arteries (ACA): Normal. Both A1 segments are present. Patent anterior communicating artery (a-comm). --Middle cerebral arteries (MCA): Normal. VENOUS SINUSES: As permitted by contrast timing, patent. ANATOMIC VARIANTS: Fetal origins of both posterior cerebral arteries. Review of the MIP images confirms the above findings. IMPRESSION: 1. No emergent large vessel occlusion or high-grade stenosis of the intracranial arteries. 2. C4-6 ossification of the posterior longitudinal ligament with moderate to severe spinal canal stenosis. Aortic atherosclerosis (ICD10-I70.0). Electronically Signed   By: Deatra Robinson M.D.   On: 04/25/2022 02:02   CT HEAD CODE STROKE WO CONTRAST  Result Date: 04/24/2022 CLINICAL DATA:  Code stroke.  Acute neurologic deficit EXAM: CT HEAD WITHOUT CONTRAST TECHNIQUE: Contiguous axial images were obtained from the base of the skull through the vertex without intravenous contrast. RADIATION DOSE REDUCTION: This exam was performed according to the departmental dose-optimization program which includes automated exposure control, adjustment of the mA and/or kV according to patient size and/or use of iterative reconstruction technique. COMPARISON:  02/10/2022 FINDINGS: Brain: There is no mass, hemorrhage or extra-axial collection. There is generalized atrophy without lobar predilection. There is hypoattenuation of the periventricular white matter, most commonly indicating chronic ischemic microangiopathy. Vascular: No abnormal hyperdensity of the major intracranial arteries or dural venous sinuses. No intracranial atherosclerosis. Skull: The visualized skull base, calvarium and extracranial soft tissues are normal. Sinuses/Orbits: No fluid levels or advanced mucosal thickening of the visualized paranasal sinuses. No mastoid or middle ear effusion. The orbits are  normal.  ASPECTS (Alberta Stroke Program Early CT Score) - Ganglionic level infarction (caudate, lentiform nuclei, internal capsule, insula, M1-M3 cortex): 7 - Supraganglionic infarction (M4-M6 cortex): 3 Total score (0-10 with 10 being normal): 10 IMPRESSION: 1. No acute intracranial abnormality. 2. ASPECTS is 10. Electronically Signed   By: Deatra Robinson M.D.   On: 04/24/2022 23:08    Cardiac Studies   04/25/22 TTE  IMPRESSIONS     1. Left ventricular ejection fraction, by estimation, is 30%. The left  ventricle has moderately decreased function. The left ventricle  demonstrates regional wall motion abnormalities with hyperkinetic basal LV  segments and akinesis of the mid to apical  anteroseptal, mid-apical inferoseptal, apical lateral, apical anterior,  and mid-apical inferior walls as well as the true apex. With hyperdynamic  basal LV contraction, there is mitral valve systolic anterior motion with  LV outflow tract gradient, peak 45   mmHg. These findings are consistent with large wrap-around LAD territory  MI or a stress (Takotsubo-type) cardiomyopathy. There is mild left  ventricular hypertrophy. Left ventricular diastolic parameters are  consistent with Grade I diastolic dysfunction  (impaired relaxation).   2. Right ventricular systolic function is normal. The right ventricular  size is normal. There is normal pulmonary artery systolic pressure. The  estimated right ventricular systolic pressure is 30.2 mmHg.   3. There was systolic anterior motion of the anterior mitral leaflet. The  mitral valve is abnormal. Mild to moderate mitral valve regurgitation. No  evidence of mitral stenosis.   4. The aortic valve is tricuspid. There is mild calcification of the  aortic valve. Aortic valve regurgitation is not visualized. No aortic  stenosis is present.   5. The inferior vena cava is normal in size with greater than 50%  respiratory variability, suggesting right atrial pressure of 3 mmHg.    FINDINGS   Left Ventricle: Left ventricular ejection fraction, by estimation, is  30%. The left ventricle has moderately decreased function. The left  ventricle demonstrates regional wall motion abnormalities. Definity  contrast agent was given IV to delineate the  left ventricular endocardial borders. The left ventricular internal cavity  size was normal in size. There is mild left ventricular hypertrophy. Left  ventricular diastolic parameters are consistent with Grade I diastolic  dysfunction (impaired relaxation).   Right Ventricle: The right ventricular size is normal. No increase in  right ventricular wall thickness. Right ventricular systolic function is  normal. There is normal pulmonary artery systolic pressure. The tricuspid  regurgitant velocity is 2.61 m/s, and   with an assumed right atrial pressure of 3 mmHg, the estimated right  ventricular systolic pressure is 30.2 mmHg.   Left Atrium: Left atrial size was normal in size.   Right Atrium: Right atrial size was normal in size.   Pericardium: There is no evidence of pericardial effusion.   Mitral Valve: There was systolic anterior motion of the anterior mitral  leaflet. The mitral valve is abnormal. Mild to moderate mitral annular  calcification. Mild to moderate mitral valve regurgitation. No evidence of  mitral valve stenosis.   Tricuspid Valve: The tricuspid valve is normal in structure. Tricuspid  valve regurgitation is trivial.   Aortic Valve: The aortic valve is tricuspid. There is mild calcification  of the aortic valve. Aortic valve regurgitation is not visualized. No  aortic stenosis is present.   Pulmonic Valve: The pulmonic valve was normal in structure. Pulmonic valve  regurgitation is not visualized.   Aorta: The aortic root is normal  in size and structure.   Venous: The inferior vena cava is normal in size with greater than 50%  respiratory variability, suggesting right atrial pressure of 3  mmHg.   IAS/Shunts: No atrial level shunt detected by color flow Doppler.   Patient Profile     Emmi Wertheim is a 82 y.o. female with a hx of massive PE (lifelong anticoagulation), congestive heart failure (recovered EF) with non-ischemic cardiomyopathy, coronary calcification on CT, paroxysmal afib, hypertension, hyperlipidemia, hypothyroidism, CKD, large bowel stricture, lower GI bleed who is being seen for the evaluation of elevated troponin at the request of Dr. Rhona Leavens. Ms. Polakowski presented to the [facility] ED on 4/21 with EMS for evaluation of symptoms of right lower extremity weakness, last normal around 8pm on 4/21. Patient found that she was unable to lift her right leg and also had associated numbness. Patient denied other neurological abnormalities. She did report some generalized chest discomfort. Initial workup noted hypertension with BP 180s/90s. Given leg weakness, a code stroke was called and patient was seen by teleneurology. CT head negative for acute intracranial abnormality and neurology recommended admission for further stroke workup with brain MRI. Permissive hypertension also recommended. Initial labwork noted creatinine 1.33, WBC 11.1. Given chest discomfort, troponin also checked, found elevated at 1645->1869.   Assessment & Plan    Elevated troponin Hx minimal CAD, non-obstructive   Patient with hx mild plaque in mid segment of large dominant RCA ~20%, no obstructive disease in LAD or Lcx per 12/2020 LHC. Troponin this admission 1645->1869->1919->2278. ECG with sinus tachycardia, no acute ischemic changes appreciated.   TTE with LVEF 30%, hyperkinetic basal LV  segments and akinesis of the mid to apical  anteroseptal, mid-apical inferoseptal, apical lateral, apical anterior, and mid-apical inferior walls as well as the true apex. Given clinical presentation, this very well could represent stress cardiomyopathy Fortunately, patient remains chest pain free and has no dyspnea on exam  today. Patient expressed a desire for more conservative management if possible. Given above findings, LHC indicated. Will plan to allow INR to drift to 2.0 and then start heparin in preparation for cath.   Chronic systolic CHF (recovered EF) Hypertension   Patient on GDMT of Metoprolol Succinate 50mg  QD, Entresto 24-26mg  BID, Spironolactone 25mg  QD, Farxiga 10mg  QD.    Current BP too low to support home GDMT. Will continue to monitor and resume as able. Would prefer to start Metoprolol first.    Paroxysmal atrial fibrillation CHA2DS2-VASc Score = 6    Sinus rhythm/tachycardia this admission. Allowing INR to drift down as above, then will start heparin.   Hx PE   Patient on lifeline OAC. Given need for [facility] and INR 3.8 this AM, will continue to hold warfarin to allow INR to drift down to 2.0 as above.   Hyperlipidemia   Continue Atorvastatin 40mg .  Per primary team: DM type II Right side weakness: Negative CVA workup CKD IIIa Hypothyroidism      For questions or updates, please contact [facility] Please consult [email or web address] for contact info under        Signed, Perlie Gold, PA-C  04/26/2022, 8:05 AM

## 2022-04-26 NOTE — Progress Notes (Signed)
PT Cancellation Note    Patient Details Name: Hannah [name] MRN: [id number] DOB: 05-Jul-1940   Cancelled Treatment:    Reason Eval/Treat Not Completed: Other (comment);Medical issues which prohibited therapy; defer PT until after heart cath per MD; likely transfer to [facility] later this wk.  PT signing off for now, please re-order as indicated   Delice Bison, PT  Acute Rehab Dept [facility]) [phone]  04/26/2022    [facility] 04/26/2022, 1:36 PM

## 2022-04-26 NOTE — Progress Notes (Signed)
OT Cancellation Note  Patient Details Name: Hannah [name] MRN: [id number] DOB: 1940/08/30   Cancelled Treatment:    Reason Eval/Treat Not Completed: Medical issues which prohibited therapy defer OT until after heart cath per MD; likely transfer to [facility] later this wk.  Rosalio Loud, MS Acute Rehabilitation Department Office# [phone]  04/26/2022, 3:07 PM

## 2022-04-26 NOTE — Plan of Care (Signed)
  Problem: Nutrition: Goal: Dietary intake will improve Outcome: Progressing   Problem: Education: Goal: Knowledge of disease or condition will improve Outcome: Progressing   Problem: Health Behavior/Discharge Planning: Goal: Ability to manage health-related needs will improve Outcome: Progressing   Problem: Self-Care: Goal: Ability to participate in self-care as condition permits will improve Outcome: Progressing

## 2022-04-26 NOTE — Progress Notes (Signed)
Progress Note   Patient: Hannah [name] WUJ:[id number] DOB: [birth year] DOA: 04/24/2022     1 DOS: the patient was seen and examined on 04/26/2022   Brief hospital course: 82 y.o. female with medical history significant of pulmonary hypertension, CAD, hypertension, paroxysmal atrial fibrillation, PE on Coumadin, type 2 diabetes, CKD 3A, hypothyroidism and hyperlipidemia who presents with right lower extremity weakness.   Patient believes symptoms happened around 8pm. She ambulates with walker at baseline. She was able to go down stairs at her daughter's home. Then had to go up a few stairs into her own home but could not get her right leg to lift up even with assistance from family members. Denies any headache or vision. Feels mid-chest tightness since being in ED but thinks due to anxiety. Also has notable right UE intention tremor she reports started 2 days ago.    In the ED, she was afebrile with elevated blood pressure of up to 180s over 90s on room air.   Code stroke was initiated in the ED. Pt was admitted for stroke work up  Assessment and Plan: Right sided weakness -Right sided weakness on exam with right facial drop. Concerning for stroke.  - MRI brain without acute infarct noted - obtain CTA head and neck unremarkable, reviewed - 2d echo with EF 30%, wall motion noted, no shunt seen. See below - continue atorvastatin -cont anticoagulation with plan to transition to heparin gtt when INR<2 then resumption of coumadin once OK per Cardiology -Obtain A1c and lipids -PT/OT/SLT -Allow for permissive hypertension with blood pressure treatment as needed only if systolic goes above 220, goal to normalize bp over the week -discussed case with Neurology on call. Stroke w/u essentially complete. Rec to continue with therapeutic anticoagulation per above and f/u with Neurology in 4-6 weeks after discharge  NSTEMI -LHC in 2022 with non-obstructive disease of LAD OR CIRCUMFLEX. Mild plaque to  large, dominant RCA.  -initial trop 1600 on 4/22 with pt complaining of "indigestion." Trop did trend up to over 2200 -Chest discomfort eventually improved after NTG x3 -Consulted Cardiology, appreciate recs -2d echo noted per above. Given echo results, plan for heart cath once INR improves   S/P colostomy -secondary to hx of large bowel stricture, s/p colectomy  -daily colostomy care   Chronic systolic CHF (congestive heart failure) -Per cardiology, giving low dose metoprolol per Cardiology. No room for entresto, spironolactone -Consider farxiga prior to d/c -seems euvolemic at this time   HTN (hypertension) -hold home antihypertensive to allow for 24 hrs permissive HTN   PAF (paroxysmal atrial fibrillation) -Had been on warfarin PTA -Per Cardiology, hold coumadin, transition to heparin gtt once INR<2   Chronic kidney disease, stage 3a (HCC) -creatinine stable at baseline   History of pulmonary embolism -On warfarin with goal of 2.5-3.5 PTA -neurology recommends continuing anticoagulation per above -Per Cardiology, rec to hold coumadin and transition to heparin gtt once INR<2   Hypothyroid -continue levothyroxine   Hyperlipidemia -continue statin   DM2 (diabetes mellitus, type 2) -no hyperglycemia -remote A1C 08/2021 was controlled 6.7, repeat A1c 7.2  Subjective: Currently without chest pains or sob  Physical Exam: Vitals:   04/25/22 1952 04/26/22 0048 04/26/22 0418 04/26/22 1333  BP: 104/67 (!) 107/58 98/67 117/77  Pulse: 96 89 85 94  Resp: Temp: 98.1 F (36.7 C) 98.6 F (37 C) 98.1 F (36.7 C) 98 F (36.7 C)  TempSrc: Oral Oral Oral Oral  SpO2: 98% 99% 99% 100%  Weight:      Height:       General exam: Conversant, in no acute distress Respiratory system: normal chest rise, clear, no audible wheezing Cardiovascular system: regular rhythm, s1-s2 Gastrointestinal system: Nondistended, nontender, pos BS Central nervous system: No seizures, no  tremors, remains with R facial droop Extremities: No cyanosis, no joint deformities Skin: No rashes, no pallor Psychiatry: Affect normal // no auditory hallucinations   Data Reviewed:  Labs reviewed: Na 137, K 3.7, Cr 1.58  Family Communication: Pt in room, family not at bedside  Disposition: Status is: Inpatient Remain inpatient because: Severity of illness  Planned Discharge Destination:  Unclear at this time, pending PT eval    Author: Rickey Barbara, MD 04/26/2022 2:57 PM  For on call review [email or web address].

## 2022-04-26 NOTE — Progress Notes (Signed)
SLP Cancellation Note  Patient Details Name: Hannah [name] MRN: [id number] DOB: 06-30-1940   Cancelled treatment:       Reason Eval/Treat Not Completed: SLP screened, no needs identified, will sign off. MRI negative, no report of acute speech language impairment.    Gloriajean Okun, Riley Nearing 04/26/2022, 10:00 AM

## 2022-04-27 ENCOUNTER — Ambulatory Visit: Payer: Medicare Other

## 2022-04-27 DIAGNOSIS — R7989 Other specified abnormal findings of blood chemistry: Secondary | ICD-10-CM | POA: Diagnosis not present

## 2022-04-27 DIAGNOSIS — Z7901 Long term (current) use of anticoagulants: Secondary | ICD-10-CM | POA: Diagnosis not present

## 2022-04-27 DIAGNOSIS — I429 Cardiomyopathy, unspecified: Secondary | ICD-10-CM

## 2022-04-27 DIAGNOSIS — R531 Weakness: Secondary | ICD-10-CM | POA: Diagnosis not present

## 2022-04-27 LAB — COMPREHENSIVE METABOLIC PANEL
ALT: 12 U/L (ref 0–44)
AST: 18 U/L (ref 15–41)
Albumin: 2.8 g/dL — ABNORMAL LOW (ref 3.5–5.0)
Alkaline Phosphatase: 53 U/L (ref 38–126)
Anion gap: 11 (ref 5–15)
BUN: 34 mg/dL — ABNORMAL HIGH (ref 8–23)
CO2: 21 mmol/L — ABNORMAL LOW (ref 22–32)
Calcium: 8.5 mg/dL — ABNORMAL LOW (ref 8.9–10.3)
Chloride: 103 mmol/L (ref 98–111)
Creatinine, Ser: 1.53 mg/dL — ABNORMAL HIGH (ref 0.44–1.00)
GFR, Estimated: 34 mL/min — ABNORMAL LOW (ref >60)
Glucose, Bld: 116 mg/dL — ABNORMAL HIGH (ref 70–99)
Potassium: 3.6 mmol/L (ref 3.5–5.1)
Sodium: 135 mmol/L (ref 135–145)
Total Bilirubin: 1.1 mg/dL (ref 0.3–1.2)
Total Protein: 5.9 g/dL — ABNORMAL LOW (ref 6.5–8.1)

## 2022-04-27 LAB — CBC
HCT: 43.7 % (ref 36.0–46.0)
Hemoglobin: 13.7 g/dL (ref 12.0–15.0)
MCH: 26.6 pg (ref 26.0–34.0)
MCHC: 31.4 g/dL (ref 30.0–36.0)
MCV: 84.7 fL (ref 80.0–100.0)
Platelets: 233 10*3/uL (ref 150–400)
RBC: 5.16 MIL/uL — ABNORMAL HIGH (ref 3.87–5.11)
RDW: 18.8 % — ABNORMAL HIGH (ref 11.5–15.5)
WBC: 8.4 10*3/uL (ref 4.0–10.5)
nRBC: 0 % (ref 0.0–0.2)

## 2022-04-27 LAB — PROTIME-INR
INR: 3 — ABNORMAL HIGH (ref 0.8–1.2)
Prothrombin Time: 30.9 seconds — ABNORMAL HIGH (ref 11.4–15.2)

## 2022-04-27 NOTE — Care Management (Signed)
  Transition of Care [facility]) Screening Note   Patient Details  Name: Hannah [name] Date of Birth: 06-28-1940   Transition of Care [facility]) CM/SW Contact:    Gala Lewandowsky, RN Phone Number: 04/27/2022, 2:03 PM    Transition of Care Department [facility]) has reviewed the patient and no TOC needs have been identified at this time. Patient is a transfer from [facility]. Per notes, plan for cath once INR below 2. Patient will benefit from PT/OT consult prior to transition home. We will continue to monitor patient advancement through interdisciplinary progression rounds. If new patient transition needs arise, please place a TOC consult.

## 2022-04-27 NOTE — Progress Notes (Signed)
ANTICOAGULATION CONSULT NOTE  Pharmacy Consult for warfarin  >> heparin Indication: pulmonary embolus  Allergies  Allergen Reactions   Augmentin [Amoxicillin-Pot Clavulanate] Itching and Other (See Comments)    Severe vaginal itching   Tranxene [Clorazepate] Itching   Crestor [Rosuvastatin]     Made her sick on stomach, she is ab oreDEID_[id number]$5\' 7"g (Calculated) : 61.6 Heparin Dosing Weight:   Vital Signs: Temp: 97.7 F (36.5 C) (04/24 0748) Temp Source: Oral (04/24 0748) BP: 107/60 (04/24 0748) Pulse Rate: 73 (04/24 0748)  Labs: Recent Labs    04/24/22 2321 04/24/22 2336 04/25/22 0549 04/25/22 0840 04/25/22 1024 04/25/22 1216 04/26/22 0501 04/27/22 0224  HGB 15.5* 17.7*  --   --   --   --  13.7 13.7  HCT 50.4* 52.0*  --   --   --   --  44.3 43.7  PLT 261  --   --   --   --   --  224 233  APTT 46*  --   --   --   --   --   --   --   LABPROT 30.6*  --   --   --   --   --  37.2* 30.9*  INR 3.0*  --   --   --   --   --  3.8* 3.0*  CREATININE 1.33* 1.30*  --   --   --   --  1.58* 1.53*  TROPONINIHS  --   --    < > 1,869* 1,919* 2,278*  --   --    < > = values in this interval not displayed.     Estimated Creatinine Clearance: 34.5 mL/min (A) (by C-G formula based on SCr of 1.53 mg/dL (H)).   Medical History: Past Medical History:  Diagnosis Date   Acute massive pulmonary embolism 07/13/2012   Massive PE w/ PEA arrest 07/13/12 >TNK >IVC filter >discharged on comadin     Anticoagulated on warfarin    Chronic diastolic CHF (congestive heart failure) 03/15/2017   Chronic kidney disease, stage 3a 12/15/2018   Diabetes mellitus, type 2 07/15/2012   with peripheral neuropathy   DM2 (diabetes mellitus, type 2) 07/23/2012   HFrEF (heart failure with reduced ejection fraction) 03/15/2017   Non-ischemic cardiomyopathy // Cardiac catheterization  01/01/21:  RCA prox to mid 20 // Echocardiogram 12/26/20:  EF 30-35, ant septum/apex, mid inf-sept AK; ant-lat/ant/inf HK, Gr 1 DD, mild LVH, normal RVSF, RVSP 27.2, small effusion (ant to RV), mild MR, mod TR, AV sclerosis w/o AS   Hyperlipemia 07/14/2012   Hyperlipidemia 02/25/2013   Hypertension 07/14/2012   Hypothyroid 06/06/2013   Large bowel stricture    s/p colectomy in 2016   Neuropathy 02/12/2015   Osteoarthritis    s/p hip and knee replacements   PAF (paroxysmal atrial fibrillation)    Pulmonary hypertension 07/26/2016   Syncope 10/2018   Thyroid disease     Medications:  Per anti-coag visit note on 04/13/22 Warfarin  every Wed and Friday, 2.5mg  all other days  Assessment: 72 yoF with hx AF and PE on warfarin PTA (INR goal 2.5-3.5) admitted with RLE weakness. Pt also with CP, warfarin held with plans for cardiac cath once INR trends down.  INR today is 3.0.  Goal of Therapy:  INR 2.5-3.5 (per anti-coag visit note) Heparin  level 0.3-0.7 units/ml   Plan:  Hold warfarin Daily INR - heparin once INR <2  Fredonia Highland, PharmD, BCPS, [facility] Clinical Pharmacist [phone] Please check AMION for all [facility] Pharmacy numbers 04/27/2022

## 2022-04-27 NOTE — Progress Notes (Signed)
Rounding Note    Patient Name: Hannah [name] Date of Encounter: 04/27/2022  [facility] Cardiologist: Dietrich Pates, MD   Subjective   Transferred to [facility] yesterday. Feeling well, no complaints. No bleeding. Waiting for INR to drift down prior to cath.  Inpatient Medications    Scheduled Meds:  aspirin EC  81 mg Oral Daily   atorvastatin  40 mg Oral Daily   levothyroxine  25 mcg Oral Q0600   metoprolol tartrate  12.5 mg Oral BID   Continuous Infusions:  PRN Meds: acetaminophen **OR** acetaminophen (TYLENOL) oral liquid 160 mg/5 mL **OR** acetaminophen, morphine injection, nitroGLYCERIN   Vital Signs    Vitals:   04/26/22 2118 04/27/22 0134 04/27/22 0442 04/27/22 0748  BP: 114/72 113/65 (!) 115/59 107/60  Pulse: 79 67 76 73  Resp:  18 16 19   Temp:  97.9 F (36.6 C) (!) 97.5 F (36.4 C) 97.7 F (36.5 C)  TempSrc:  Oral Oral Oral  SpO2:  97% 98% 98%  Weight:      Height:        Intake/Output Summary (Last 24 hours) at 04/27/2022 0955 Last data filed at 04/26/2022 2007 Gross per 24 hour  Intake 120 ml  Output 550 ml  Net -430 ml      04/25/2022    6:00 PM 04/25/2022    2:49 AM 12/15/2021    2:53 PM  Last 3 Weights  Weight (lbs) 206 lb 12.7 oz 195 lb 9.6 oz 198 lb 12.8 oz  Weight (kg) 93.8 kg 88.724 kg 90.175 kg      Telemetry    SR - Personally Reviewed  ECG    No new since 4/22 - Personally Reviewed  Physical Exam   GEN: No acute distress.   Neck: No JVD Cardiac: RRR, no murmurs, rubs, or gallops.  Respiratory: Clear to auscultation bilaterally. GI: Soft, nontender, non-distended  MS: No edema; No deformity. Neuro:  mild R facial droop Psych: Normal affect   Labs    High Sensitivity Troponin:   Recent Labs  Lab 04/25/22 0549 04/25/22 0840 04/25/22 1024 04/25/22 1216  TROPONINIHS 1,645* 1,869* 1,919* 2,278*     Chemistry Recent Labs  Lab 04/24/22 2321 04/24/22 2336 04/26/22 0501 04/27/22 0224  NA 139 139 137 135  K 4.1  4.4 3.7 3.6  CL 106 109 103 103  CO2 19*  --  23 21*  GLUCOSE 122* 122* 115* 116*  BUN 26* 35* 33* 34*  CREATININE 1.33* 1.30* 1.58* 1.53*  CALCIUM 9.0  --  8.4* 8.5*  PROT 7.7  --  6.0* 5.9*  ALBUMIN 3.8  --  3.0* 2.8*  AST 22  --  20 18  ALT 14  --  11 12  ALKPHOS 69  --  51 53  BILITOT 1.0  --  1.1 1.1  GFRNONAA 40*  --  33* 34*  ANIONGAP 14  --  11 11    Lipids  Recent Labs  Lab 04/25/22 0549  CHOL 118  TRIG 51  HDL 49  LDLCALC 59  CHOLHDL 2.4    Hematology Recent Labs  Lab 04/24/22 2321 04/24/22 2336 04/26/22 0501 04/27/22 0224  WBC 11.1*  --  8.9 8.4  RBC 5.88*  --  5.15* 5.16*  HGB 15.5* 17.7* 13.7 13.7  HCT 50.4* 52.0* 44.3 43.7  MCV 85.7  --  86.0 84.7  MCH 26.4  --  26.6 26.6  MCHC 30.8  --  30.9 31.4  RDW 20.0*  --  19.1* 18.8*  PLT 261  --  224 233   Thyroid No results for input(s): "TSH", "FREET4" in the last 168 hours.  BNPNo results for input(s): "BNP", "PROBNP" in the last 168 hours.  DDimer No results for input(s): "DDIMER" in the last 168 hours.   Radiology    ECHOCARDIOGRAM COMPLETE  Result Date: 04/25/2022    ECHOCARDIOGRAM REPORT   Patient Name:   Hannah [name] Date of Exam: 04/25/2022 Medical Rec #:  [id number]    Height:       67.0 in Accession #:    [id number]   Weight:       195.6 lb Date of Birth:  [birth year]    BSA:          2.003 m Patient Age:    81 years     BP:           126/75 mmHg Patient Gender: F            HR:           102 bpm. Exam Location:  Inpatient Procedure: 2D Echo, Color Doppler, Cardiac Doppler and Intracardiac            Opacification Agent REPORT CONTAINS CRITICAL RESULT Indications:    Stroke  History:        Patient has prior history of Echocardiogram examinations, most                 recent 05/24/2021. HFrEF and CHF, Pulmonary HTN,                 Arrythmias:Atrial Fibrillation, Signs/Symptoms:Syncope; Risk                 Factors:Hypertension, Dyslipidemia and Diabetes. CKD.  Sonographer:    Milda Smart Referring  Phys: [id number] CHING T TU  Sonographer Comments: Technically difficult study due to poor echo windows. Image acquisition challenging due to patient body habitus and Image acquisition challenging due to respiratory motion. IMPRESSIONS  1. Left ventricular ejection fraction, by estimation, is 30%. The left ventricle has moderately decreased function. The left ventricle demonstrates regional wall motion abnormalities with hyperkinetic basal LV segments and akinesis of the mid to apical anteroseptal, mid-apical inferoseptal, apical lateral, apical anterior, and mid-apical inferior walls as well as the true apex. With hyperdynamic basal LV contraction, there is mitral valve systolic anterior motion with LV outflow tract gradient, peak 45  mmHg. These findings are consistent with large wrap-around LAD territory MI or a stress (Takotsubo-type) cardiomyopathy. There is mild left ventricular hypertrophy. Left ventricular diastolic parameters are consistent with Grade I diastolic dysfunction (impaired relaxation).  2. Right ventricular systolic function is normal. The right ventricular size is normal. There is normal pulmonary artery systolic pressure. The estimated right ventricular systolic pressure is 30.2 mmHg.  3. There was systolic anterior motion of the anterior mitral leaflet. The mitral valve is abnormal. Mild to moderate mitral valve regurgitation. No evidence of mitral stenosis.  4. The aortic valve is tricuspid. There is mild calcification of the aortic valve. Aortic valve regurgitation is not visualized. No aortic stenosis is present.  5. The inferior vena cava is normal in size with greater than 50% respiratory variability, suggesting right atrial pressure of 3 mmHg. FINDINGS  Left Ventricle: Left ventricular ejection fraction, by estimation, is 30%. The left ventricle has moderately decreased function. The left ventricle demonstrates regional wall motion abnormalities. Definity contrast agent was given IV to  delineate the left ventricular endocardial borders. The left ventricular  internal cavity size was normal in size. There is mild left ventricular hypertrophy. Left ventricular diastolic parameters are consistent with Grade I diastolic dysfunction (impaired relaxation). Right Ventricle: The right ventricular size is normal. No increase in right ventricular wall thickness. Right ventricular systolic function is normal. There is normal pulmonary artery systolic pressure. The tricuspid regurgitant velocity is 2.61 m/s, and  with an assumed right atrial pressure of 3 mmHg, the estimated right ventricular systolic pressure is 30.2 mmHg. Left Atrium: Left atrial size was normal in size. Right Atrium: Right atrial size was normal in size. Pericardium: There is no evidence of pericardial effusion. Mitral Valve: There was systolic anterior motion of the anterior mitral leaflet. The mitral valve is abnormal. Mild to moderate mitral annular calcification. Mild to moderate mitral valve regurgitation. No evidence of mitral valve stenosis. Tricuspid Valve: The tricuspid valve is normal in structure. Tricuspid valve regurgitation is trivial. Aortic Valve: The aortic valve is tricuspid. There is mild calcification of the aortic valve. Aortic valve regurgitation is not visualized. No aortic stenosis is present. Pulmonic Valve: The pulmonic valve was normal in structure. Pulmonic valve regurgitation is not visualized. Aorta: The aortic root is normal in size and structure. Venous: The inferior vena cava is normal in size with greater than 50% respiratory variability, suggesting right atrial pressure of 3 mmHg. IAS/Shunts: No atrial level shunt detected by color flow Doppler.  LEFT VENTRICLE PLAX 2D LVIDd:         3.00 cm LVIDs:         2.10 cm LV PW:         1.20 cm LV IVS:        1.20 cm LVOT diam:     2.00 cm LVOT Area:     3.14 cm  LEFT ATRIUM             Index        RIGHT ATRIUM          Index LA diam:        3.10 cm 1.55 cm/m    RA Area:     8.57 cm LA Vol (A2C):   30.6 ml 15.28 ml/m  RA Volume:   13.30 ml 6.64 ml/m LA Vol (A4C):   16.8 ml 8.39 ml/m LA Biplane Vol: 23.6 ml 11.78 ml/m   AORTA Ao Root diam: 2.80 cm Ao Asc diam:  2.90 cm TRICUSPID VALVE TR Peak grad:   27.2 mmHg TR Mean grad:   15.0 mmHg TR Vmax:        261.00 cm/s TR Vmean:       189.0 cm/s  SHUNTS Systemic Diam: 2.00 cm Dalton McleanMD Electronically signed by Wilfred Lacy Signature Date/Time: 04/25/2022/4:12:17 PM    Final    MR BRAIN WO CONTRAST  Result Date: 04/25/2022 CLINICAL DATA:  Neuro deficit, acute, stroke suspected right upper and lower extremity weakness, right facial droop EXAM: MRI HEAD WITHOUT CONTRAST TECHNIQUE: Multiplanar, multiecho pulse sequences of the brain and surrounding structures were obtained without intravenous contrast. COMPARISON:  MRI October 31, 2018.  CT head April 24, 2022. FINDINGS: Brain: No acute infarction, hemorrhage, hydrocephalus, extra-axial collection or mass lesion. Cerebral atrophy with probably ex vacuo ventricular dilation. Mild for age T2/FLAIR hyperintensities in the white matter, compatible with chronic microvascular ischemic disease. Vascular: Major arterial flow voids maintained at the skull base. Skull and upper cervical spine: Normal marrow signal. Sinuses/Orbits: Largely clear sinuses.  No acute orbital findings. Other: No mastoid effusions. IMPRESSION: 1. No  evidence of acute intracranial abnormality. 2. Cerebral Atrophy (ICD10-G31.9) with probably ex vacuo ventricular dilation. Electronically Signed   By: Feliberto Harts M.D.   On: 04/25/2022 12:09    Cardiac Studies   Cardiac Studies & Procedures   CARDIAC CATHETERIZATION  CARDIAC CATHETERIZATION 01/01/2021  Narrative   Prox RCA to Mid RCA lesion is 20% stenosed.  Mild plaque in the mid segment of the large, dominant RCA No obstructive disease in the LAD or Circumflex Normal LV filling pressure.  Recommendations: Her cardiomyopathy is  non-ischemic. Consider her uncontrolled HTN as a cause of her cardiomyopathy (SBP 220 mmHg on arrival to the cath lab earlier this week and SBP of 195 on arrival to the cath lab today). Titrate beta blocker. Add additional medical therapy for better BP control.  Findings Coronary Findings Diagnostic  Dominance: Right  Left Anterior Descending Vessel is large.  Left Circumflex Vessel is large.  Right Coronary Artery Vessel is large. Prox RCA to Mid RCA lesion is 20% stenosed.  Intervention  No interventions have been documented.   CARDIAC CATHETERIZATION 12/30/2020  Narrative Normal right heart pressures Unsuccessful attempt at left heart cath from the right radial artery due to radial artery loop.  Recommendations: Will plan to bring her back for left heart catheterization from the right femoral artery approach tomorrow if her INR is less than 1.7. Given her elevated INR today, I did not feel that it was safe to proceed with femoral artery access today. Her right radial artery should not be used for access for future cardiac caths. NPO at midnight. Will not resume IV heparin tonight since we did obtain access into the right femoral vein today.   STRESS TESTS  NM MYOCAR MULTI W/SPECT W 12/16/2018  Narrative  There was no ST segment deviation noted during stress.  The study is normal.  This is a low risk study.  The left ventricular ejection fraction is normal (55-65%).  Nuclear stress EF: 57%.  No T wave inversion was noted during stress.  1. Is a normal myocardial perfusion imaging study without evidence of ischemia or infarction. 2.  Significant diaphragm attenuation noted. 3.  Normal left-ventricular ejection fraction without wall motion normality.  Gerri Spore T. Flora Lipps, MD [facility] [address], [address] [place], [place] [address] [phone] 4:27 PM   ECHOCARDIOGRAM  ECHOCARDIOGRAM COMPLETE 04/25/2022  Narrative ECHOCARDIOGRAM  REPORT    Patient Name:   ADARIA HOLE Date of Exam: 04/25/2022 Medical Rec #:  [id number]    Height:       67.0 in Accession #:    [id number]   Weight:       195.6 lb Date of Birth:  [birth year]    BSA:          2.003 m Patient Age:    81 years     BP:           126/75 mmHg Patient Gender: F            HR:           102 bpm. Exam Location:  Inpatient  Procedure: 2D Echo, Color Doppler, Cardiac Doppler and Intracardiac Opacification Agent  REPORT CONTAINS CRITICAL RESULT  Indications:    Stroke  History:        Patient has prior history of Echocardiogram examinations, most recent 05/24/2021. HFrEF and CHF, Pulmonary HTN, Arrythmias:Atrial Fibrillation, Signs/Symptoms:Syncope; Risk Factors:Hypertension, Dyslipidemia and Diabetes. CKD.  Sonographer:    Milda Smart Referring Phys: [id number] CHING  T TU   Sonographer Comments: Technically difficult study due to poor echo windows. Image acquisition challenging due to patient body habitus and Image acquisition challenging due to respiratory motion. IMPRESSIONS   1. Left ventricular ejection fraction, by estimation, is 30%. The left ventricle has moderately decreased function. The left ventricle demonstrates regional wall motion abnormalities with hyperkinetic basal LV segments and akinesis of the mid to apical anteroseptal, mid-apical inferoseptal, apical lateral, apical anterior, and mid-apical inferior walls as well as the true apex. With hyperdynamic basal LV contraction, there is mitral valve systolic anterior motion with LV outflow tract gradient, peak 45 mmHg. These findings are consistent with large wrap-around LAD territory MI or a stress (Takotsubo-type) cardiomyopathy. There is mild left ventricular hypertrophy. Left ventricular diastolic parameters are consistent with Grade I diastolic dysfunction (impaired relaxation). 2. Right ventricular systolic function is normal. The right ventricular size is normal. There is normal  pulmonary artery systolic pressure. The estimated right ventricular systolic pressure is 30.2 mmHg. 3. There was systolic anterior motion of the anterior mitral leaflet. The mitral valve is abnormal. Mild to moderate mitral valve regurgitation. No evidence of mitral stenosis. 4. The aortic valve is tricuspid. There is mild calcification of the aortic valve. Aortic valve regurgitation is not visualized. No aortic stenosis is present. 5. The inferior vena cava is normal in size with greater than 50% respiratory variability, suggesting right atrial pressure of 3 mmHg.  FINDINGS Left Ventricle: Left ventricular ejection fraction, by estimation, is 30%. The left ventricle has moderately decreased function. The left ventricle demonstrates regional wall motion abnormalities. Definity contrast agent was given IV to delineate the left ventricular endocardial borders. The left ventricular internal cavity size was normal in size. There is mild left ventricular hypertrophy. Left ventricular diastolic parameters are consistent with Grade I diastolic dysfunction (impaired relaxation).  Right Ventricle: The right ventricular size is normal. No increase in right ventricular wall thickness. Right ventricular systolic function is normal. There is normal pulmonary artery systolic pressure. The tricuspid regurgitant velocity is 2.61 m/s, and with an assumed right atrial pressure of 3 mmHg, the estimated right ventricular systolic pressure is 30.2 mmHg.  Left Atrium: Left atrial size was normal in size.  Right Atrium: Right atrial size was normal in size.  Pericardium: There is no evidence of pericardial effusion.  Mitral Valve: There was systolic anterior motion of the anterior mitral leaflet. The mitral valve is abnormal. Mild to moderate mitral annular calcification. Mild to moderate mitral valve regurgitation. No evidence of mitral valve stenosis.  Tricuspid Valve: The tricuspid valve is normal in structure.  Tricuspid valve regurgitation is trivial.  Aortic Valve: The aortic valve is tricuspid. There is mild calcification of the aortic valve. Aortic valve regurgitation is not visualized. No aortic stenosis is present.  Pulmonic Valve: The pulmonic valve was normal in structure. Pulmonic valve regurgitation is not visualized.  Aorta: The aortic root is normal in size and structure.  Venous: The inferior vena cava is normal in size with greater than 50% respiratory variability, suggesting right atrial pressure of 3 mmHg.  IAS/Shunts: No atrial level shunt detected by color flow Doppler.   LEFT VENTRICLE PLAX 2D LVIDd:         3.00 cm LVIDs:         2.10 cm LV PW:         1.20 cm LV IVS:        1.20 cm LVOT diam:     2.00 cm LVOT Area:  3.14 cm   LEFT ATRIUM             Index        RIGHT ATRIUM          Index LA diam:        3.10 cm 1.55 cm/m   RA Area:     8.57 cm LA Vol (A2C):   30.6 ml 15.28 ml/m  RA Volume:   13.30 ml 6.64 ml/m LA Vol (A4C):   16.8 ml 8.39 ml/m LA Biplane Vol: 23.6 ml 11.78 ml/m  AORTA Ao Root diam: 2.80 cm Ao Asc diam:  2.90 cm  TRICUSPID VALVE TR Peak grad:   27.2 mmHg TR Mean grad:   15.0 mmHg TR Vmax:        261.00 cm/s TR Vmean:       189.0 cm/s  SHUNTS Systemic Diam: 2.00 cm  Dalton McleanMD Electronically signed by Wilfred Lacy Signature Date/Time: 04/25/2022/4:12:17 PM    Final              Patient Profile     82 y.o. female with a hx of massive PE (lifelong anticoagulation), prior nonischemic cardiomyopathy (recovered EF) with non-ischemic cardiomyopathy, minimal nonobstructive CAD, paroxysmal afib, hypertension, hyperlipidemia, who is being followed for the evaluation of elevated troponin. Ms. Buell presented to the [facility] ED on 4/21 with EMS for evaluation of symptoms of right lower extremity weakness, last normal around 8pm on 4/21. Patient found that she was unable to lift her right leg and also had associated numbness.  Patient denied other neurological abnormalities. She did report some generalized chest discomfort. Initial workup noted hypertension with BP 180s/90s. Given leg weakness, a code stroke was called and patient was seen by teleneurology. CT head negative for acute intracranial abnormality and neurology recommended admission for further stroke workup with brain MRI. Permissive hypertension also recommended. Initial labwork noted creatinine 1.33, WBC 11.1. Given chest discomfort, troponin also checked, found elevated at 1645->1869.   Assessment & Plan    Reduced EF Elevated troponins History of prior systolic dysfunction with recovery of EF -no chest pain. Pattern could be takotsubo vs. Distal large LAD or multivessel disease -previously cath in 2022 with minimal 20% RCA -long standing IVCD/LBBB on ECG.  -prior cardiomyopathy thought to be 2/2 hypertension at that time. -discussed cath given abnormal echo. Not urgent at this time as she is asymptomatic. Will plan for this once INR improves. She is amenable. -on metoprolol low dose with hold parameters -no room for home entresto, spironolactone given low blood pressures -consider restart farxiga prior to discharge   Minimal nonobstructive CAD by cath -Continue atorvastatin, will start low dose aspirin in the interim. If no obstructive CAD, this can be stopped once she is restarted on coumadin.   History of PE Paroxysmal atrial fibrillation -chadsvasc 6, on coumadin long term, holding with plans for cath. Would not reverse. -INR 3.0 today, once below 2 (ideally close to 1.5) can consider cath  For questions or updates, please contact [facility] Please consult [email or web address] for contact info under     Signed, Jodelle Red, MD  04/27/2022, 9:55 AM

## 2022-04-27 NOTE — Progress Notes (Signed)
PROGRESS NOTE    Hannah [name]  ZOX:[id number] DOB: 11-Dec-1940 DOA: 04/24/2022  PCP: [name], MD   Brief Narrative:  This 82 yrs old female with medical history significant of pulmonary hypertension, CAD, hypertension, paroxysmal atrial fibrillation, PE on Coumadin, type 2 diabetes, CKD 3A, hypothyroidism and hyperlipidemia who presents with right lower extremity weakness. Patient believes symptoms happened around 8pm.  She was brought in the ED as a stroke code, feels mid-chest tightness since being in ED but thinks due to anxiety. Also has notable right UE intention tremor she reports started 2 days ago.  Neurology  and Cardiology is  consulted.  She is admitted for further evaluation.   Assessment & Plan:   Principal Problem:   Right sided weakness Active Problems:   DM2 (diabetes mellitus, type 2)   Hyperlipidemia   Hypothyroid   History of pulmonary embolism   Anticoagulated on warfarin   Chronic kidney disease, stage 3a (HCC)   CAD (coronary artery disease)   PAF (paroxysmal atrial fibrillation)   HTN (hypertension)   Chronic systolic CHF (congestive heart failure)   S/P colostomy   NSTEMI (non-ST elevated myocardial infarction)   Right sided weakness: - She presented with RUE weakness on exam with right facial drop. Concerning for stroke.  - MRI brain without acute infarct noted - CTA head and neck unremarkable, reviewed - 2d echo with EF 30%, wall motion abnormalities noted, no shunt seen.  - Continue atorvastatin - Cont anticoagulation with plan to transition to heparin gtt when INR<2 then resumption of coumadin once OK per Cardiology - Hb A1c 7.2, LDL 56 undergoal - PT/OT/SLT -Discussed case with Neurology on call. Stroke w/u essentially complete. Rec to continue with therapeutic anticoagulation per above and f/u with Neurology in 4-6 weeks after discharge.   NSTEMI: -LHC in 2022 with non-obstructive disease of LAD OR CIRCUMFLEX. Mild plaque to large,  dominant RCA.  -initial trop 1600 on 4/22 with pt complaining of "indigestion." Trop did trend up to over 2200 -Chest discomfort eventually improved after NTG x 3 -Consulted Cardiology, appreciate recs -2d echo noted per above. Given echo results, Cardio plans for heart cath once INR improves.   S/P colostomy -secondary to hx of large bowel stricture, s/p colectomy . -daily colostomy care   Chronic systolic CHF (congestive heart failure) -Per cardiology, giving low dose metoprolol per Cardiology. No room for entresto, spironolactone -Consider farxiga prior to d/c -seems euvolemic at this time.   Essential hypertension: Continue metoprolol 12.5 mg twice daily.   PAF (paroxysmal atrial fibrillation): -Had been on warfarin PTA -Per Cardiology, hold coumadin, transition to heparin gtt once INR<2   Chronic kidney disease, stage 3a (HCC): -creatinine stable at baseline. -Avoid nephrotoxic medications.   History of pulmonary embolism: -On warfarin with goal of 2.5-3.5 PTA -Neurology recommends continuing anticoagulation per above -Per Cardiology, rec to hold coumadin and transition to heparin gtt once INR<2   Hypothyroidism: -Continue levothyroxine   Hyperlipidemia: -Continue statin   DM2 (diabetes mellitus, type 2): -no hyperglycemia -remote A1C 08/2021 was controlled 6.7, repeat A1c 7.2   DVT prophylaxis: Heparin IV Code Status: Full code Family Communication: No family at bed side. Disposition Plan:  Status is: Inpatient Remains inpatient appropriate because: Admitted for stroke workup which is unremarkable.  Patient remains on anticoagulation plan is to have left heart cath once INR below 2.   Consultants:  Cardiology Neurology  Procedures: CT head CTA head and neck Antimicrobials:  Anti-infectives (From admission, onward)  None      Subjective: Patient was seen and examined at bedside.  Overnight events noted. Patient reports doing better.  Denies any  chest pain.  MRI is negative for stroke.  Objective: Vitals:   04/27/22 0134 04/27/22 0442 04/27/22 0748 04/27/22 1253  BP: 113/65 (!) 115/59 107/60 (!) 103/50  Pulse: 67 76 73 70  Resp: 18 16 19 16   Temp: 97.9 F (36.6 C) (!) 97.5 F (36.4 C) 97.7 F (36.5 C) 97.8 F (36.6 C)  TempSrc: Oral Oral Oral Oral  SpO2: 97% 98% 98% 96%  Weight:      Height:        Intake/Output Summary (Last 24 hours) at 04/27/2022 1528 Last data filed at 04/27/2022 1400 Gross per 24 hour  Intake --  Output 650 ml  Net -650 ml   Filed Weights   04/25/22 0249 04/25/22 1800  Weight: 88.7 kg 93.8 kg    Examination:  General exam: Appears calm and comfortable, deconditioned, not in any distress. Respiratory system: Clear to auscultation. Respiratory effort normal.  RR 16 Cardiovascular system: S1 & S2 heard, RRR. No JVD, murmurs, rubs, gallops or clicks.  Gastrointestinal system: Abdomen is soft, non tender, non distended, BS+ Central nervous system: Alert and oriented X 3. No focal neurological deficits. Extremities: Symmetric 5 x 5 power. Skin: No rashes, lesions or ulcers Psychiatry: Judgement and insight appear normal. Mood & affect appropriate.     Data Reviewed: I have personally reviewed following labs and imaging studies  CBC: Recent Labs  Lab 04/24/22 2321 04/24/22 2336 04/26/22 0501 04/27/22 0224  WBC 11.1*  --  8.9 8.4  NEUTROABS 8.0*  --   --   --   HGB 15.5* 17.7* 13.7 13.7  HCT 50.4* 52.0* 44.3 43.7  MCV 85.7  --  86.0 84.7  PLT 261  --  224 233   Basic Metabolic Panel: Recent Labs  Lab 04/24/22 2321 04/24/22 2336 04/26/22 0501 04/27/22 0224  NA 139 139 137 135  K 4.1 4.4 3.7 3.6  CL 106 109 103 103  CO2 19*  --  23 21*  GLUCOSE 122* 122* 115* 116*  BUN 26* 35* 33* 34*  CREATININE 1.33* 1.30* 1.58* 1.53*  CALCIUM 9.0  --  8.4* 8.5*   GFR: Estimated Creatinine Clearance: 34.5 mL/min (A) (by C-G formula based on SCr of 1.53 mg/dL (H)). Liver Function  Tests: Recent Labs  Lab 04/24/22 2321 04/26/22 0501 04/27/22 0224  AST 22 20 18   ALT 14 11 12   ALKPHOS 69 51 53  BILITOT 1.0 1.1 1.1  PROT 7.7 6.0* 5.9*  ALBUMIN 3.8 3.0* 2.8*   No results for input(s): "LIPASE", "AMYLASE" in the last 168 hours. No results for input(s): "AMMONIA" in the last 168 hours. Coagulation Profile: Recent Labs  Lab 04/24/22 2321 04/26/22 0501 04/27/22 0224  INR 3.0* 3.8* 3.0*   Cardiac Enzymes: No results for input(s): "CKTOTAL", "CKMB", "CKMBINDEX", "TROPONINI" in the last 168 hours. BNP (last 3 results) No results for input(s): "PROBNP" in the last 8760 hours. HbA1C: Recent Labs    04/25/22 0549  HGBA1C 7.2*   CBG: No results for input(s): "GLUCAP" in the last 168 hours. Lipid Profile: Recent Labs    04/25/22 0549  CHOL 118  HDL 49  LDLCALC 59  TRIG 51  CHOLHDL 2.4   Thyroid Function Tests: No results for input(s): "TSH", "T4TOTAL", "FREET4", "T3FREE", "THYROIDAB" in the last 72 hours. Anemia Panel: No results for input(s): "VITAMINB12", "FOLATE", "FERRITIN", "  TIBC", "IRON", "RETICCTPCT" in the last 72 hours. Sepsis Labs: No results for input(s): "PROCALCITON", "LATICACIDVEN" in the last 168 hours.  No results found for this or any previous visit (from the past 240 hour(s)).   Radiology Studies: ECHOCARDIOGRAM COMPLETE  Result Date: 04/25/2022    ECHOCARDIOGRAM REPORT   Patient Name:   Hannah [name] Date of Exam: 04/25/2022 Medical Rec #:  [id number]    Height:       67.0 in Accession #:    [id number]   Weight:       195.6 lb Date of Birth:  1940-12-16    BSA:          2.003 m Patient Age:    81 years     BP:           126/75 mmHg Patient Gender: F            HR:           102 bpm. Exam Location:  Inpatient Procedure: 2D Echo, Color Doppler, Cardiac Doppler and Intracardiac            Opacification Agent REPORT CONTAINS CRITICAL RESULT Indications:    Stroke  History:        Patient has prior history of Echocardiogram examinations, most                  recent 05/24/2021. HFrEF and CHF, Pulmonary HTN,                 Arrythmias:Atrial Fibrillation, Signs/Symptoms:Syncope; Risk                 Factors:Hypertension, Dyslipidemia and Diabetes. CKD.  Sonographer:    Milda Smart Referring Phys: [id number] CHING T TU  Sonographer Comments: Technically difficult study due to poor echo windows. Image acquisition challenging due to patient body habitus and Image acquisition challenging due to respiratory motion. IMPRESSIONS  1. Left ventricular ejection fraction, by estimation, is 30%. The left ventricle has moderately decreased function. The left ventricle demonstrates regional wall motion abnormalities with hyperkinetic basal LV segments and akinesis of the mid to apical anteroseptal, mid-apical inferoseptal, apical lateral, apical anterior, and mid-apical inferior walls as well as the true apex. With hyperdynamic basal LV contraction, there is mitral valve systolic anterior motion with LV outflow tract gradient, peak 45  mmHg. These findings are consistent with large wrap-around LAD territory MI or a stress (Takotsubo-type) cardiomyopathy. There is mild left ventricular hypertrophy. Left ventricular diastolic parameters are consistent with Grade I diastolic dysfunction (impaired relaxation).  2. Right ventricular systolic function is normal. The right ventricular size is normal. There is normal pulmonary artery systolic pressure. The estimated right ventricular systolic pressure is 30.2 mmHg.  3. There was systolic anterior motion of the anterior mitral leaflet. The mitral valve is abnormal. Mild to moderate mitral valve regurgitation. No evidence of mitral stenosis.  4. The aortic valve is tricuspid. There is mild calcification of the aortic valve. Aortic valve regurgitation is not visualized. No aortic stenosis is present.  5. The inferior vena cava is normal in size with greater than 50% respiratory variability, suggesting right atrial pressure of 3  mmHg. FINDINGS  Left Ventricle: Left ventricular ejection fraction, by estimation, is 30%. The left ventricle has moderately decreased function. The left ventricle demonstrates regional wall motion abnormalities. Definity contrast agent was given IV to delineate the left ventricular endocardial borders. The left ventricular internal cavity size was normal in size. There is mild left ventricular hypertrophy. Left  ventricular diastolic parameters are consistent with Grade I diastolic dysfunction (impaired relaxation). Right Ventricle: The right ventricular size is normal. No increase in right ventricular wall thickness. Right ventricular systolic function is normal. There is normal pulmonary artery systolic pressure. The tricuspid regurgitant velocity is 2.61 m/s, and  with an assumed right atrial pressure of 3 mmHg, the estimated right ventricular systolic pressure is 30.2 mmHg. Left Atrium: Left atrial size was normal in size. Right Atrium: Right atrial size was normal in size. Pericardium: There is no evidence of pericardial effusion. Mitral Valve: There was systolic anterior motion of the anterior mitral leaflet. The mitral valve is abnormal. Mild to moderate mitral annular calcification. Mild to moderate mitral valve regurgitation. No evidence of mitral valve stenosis. Tricuspid Valve: The tricuspid valve is normal in structure. Tricuspid valve regurgitation is trivial. Aortic Valve: The aortic valve is tricuspid. There is mild calcification of the aortic valve. Aortic valve regurgitation is not visualized. No aortic stenosis is present. Pulmonic Valve: The pulmonic valve was normal in structure. Pulmonic valve regurgitation is not visualized. Aorta: The aortic root is normal in size and structure. Venous: The inferior vena cava is normal in size with greater than 50% respiratory variability, suggesting right atrial pressure of 3 mmHg. IAS/Shunts: No atrial level shunt detected by color flow Doppler.  LEFT  VENTRICLE PLAX 2D LVIDd:         3.00 cm LVIDs:         2.10 cm LV PW:         1.20 cm LV IVS:        1.20 cm LVOT diam:     2.00 cm LVOT Area:     3.14 cm  LEFT ATRIUM             Index        RIGHT ATRIUM          Index LA diam:        3.10 cm 1.55 cm/m   RA Area:     8.57 cm LA Vol (A2C):   30.6 ml 15.28 ml/m  RA Volume:   13.30 ml 6.64 ml/m LA Vol (A4C):   16.8 ml 8.39 ml/m LA Biplane Vol: 23.6 ml 11.78 ml/m   AORTA Ao Root diam: 2.80 cm Ao Asc diam:  2.90 cm TRICUSPID VALVE TR Peak grad:   27.2 mmHg TR Mean grad:   15.0 mmHg TR Vmax:        261.00 cm/s TR Vmean:       189.0 cm/s  SHUNTS Systemic Diam: 2.00 cm Dalton McleanMD Electronically signed by Wilfred Lacy Signature Date/Time: 04/25/2022/4:12:17 PM    Final     Scheduled Meds:  aspirin EC  81 mg Oral Daily   atorvastatin  40 mg Oral Daily   levothyroxine  25 mcg Oral Q0600   metoprolol tartrate  12.5 mg Oral BID   Continuous Infusions:   LOS: 2 days    Time spent: 50 mins    Willeen Niece, MD [facility]   If 7PM-7AM, please contact night-coverage

## 2022-04-27 NOTE — Progress Notes (Incomplete)
   Heart Failure Stewardship Pharmacist Progress Note   PCP: [name], MD PCP-Cardiologist: Dietrich Pates, MD    HPI:  82 yo F with PMH of CHF, pulmonary HTN, CAD, HTN, afib, PE, T2DM, CKD IIIa, hypothyroidism, and HLD.  Presented to the ED on 4/21 with mobility issues and R leg weakness. Code stroke was called in ED. Did not receive thrombolytics since LKW >4.5 hours. CT and MRI without acute intracranial abnormality. Troponin was elevated, cardiology consulted. Has a history with HFrEF with LVEF 30-35% in 12/2020 due to NICM. EF recovered to 60-65% in 05/2021. Transferred to [facility] on 4/23. ECHO this admission showed EF reduced back down to 30%, with regional wall motion abnormalities, mild-moderate MR, Takotsubo cardiomyopathy vs LAD disease on differential. Plan for cath on 4/26.  Current HF Medications: Beta Blocker: metoprolol tartrate 12.5 mg BID  Prior to admission HF Medications: Diuretic: furosemide 20 mg daily PRN ACE/ARB/ARNI: Entresto 24/26 mg BID MRA: spironolactone 12.5 mg daily SGLT2i: Farxiga 10 mg daily *was not taking metoprolol XL  Pertinent Lab Values: Serum creatinine 1.33, BUN 34, Potassium 3.7, Sodium 137, Magnesium 2.0, A1c 7.2   Vital Signs: Weight: 206 lbs Blood pressure: 100-110/60s  Heart rate: 70s  I/O: incomplete yesterday; net -1.2L  Medication Assistance / Insurance Benefits Check: Does the patient have prescription insurance?  Yes Type of insurance plan: UHC Medicare  Does the patient qualify for medication assistance through manufacturers or grants?   Yes Eligible grants and/or patient assistance programs: [employer] Medication assistance applications in progress: none  Medication assistance applications approved: [employer] Approved medication assistance renewals will be completed by: PCP  Outpatient Pharmacy:  Prior to admission outpatient pharmacy: [facility] Is the patient willing to use [facility] TOC pharmacy at discharge?  Yes Is the patient willing to transition their outpatient pharmacy to utilize a [facility] outpatient pharmacy?  Yes - transitioned to [facility] mail order    Assessment: 1. Acute on chronic systolic CHF (LVEF 30%), pending cath on 4/26. NYHA class II symptoms. - Not volume overloaded on exam, no indication for diuretics - Continue metoprolol tartrate 12.5 mg BID, will need to consolidate to metoprolol succinate for discharge - Holding PTA Entresto and spironolactone with low BP - Creatinine improved, consider restarting Farxiga after cath   Plan: 1) Medication changes recommended at this time: - Restart Farxiga after cath  2) Patient assistance: - Already receives patient assistance for [name] (through [employer]) and Farxiga (through [employer])  3)  Education  - Patient has been educated on current HF medications and potential additions to HF medication regimen - Patient verbalizes understanding that over the next few months, these medication doses may change and more medications may be added to optimize HF regimen - Patient has been educated on basic disease state pathophysiology and goals of therapy   Sharen Hones, PharmD, BCPS Heart Failure Engineer, building services Phone [phone]

## 2022-04-27 NOTE — Progress Notes (Signed)
All four bed rails raised per patient's request. Patient educated to call for assistance if needs to get up. Pt verbalized understanding. Bed alarm has been activated.

## 2022-04-28 ENCOUNTER — Other Ambulatory Visit (HOSPITAL_COMMUNITY): Payer: Self-pay

## 2022-04-28 DIAGNOSIS — I429 Cardiomyopathy, unspecified: Secondary | ICD-10-CM | POA: Diagnosis not present

## 2022-04-28 DIAGNOSIS — R7989 Other specified abnormal findings of blood chemistry: Secondary | ICD-10-CM | POA: Diagnosis not present

## 2022-04-28 DIAGNOSIS — R531 Weakness: Secondary | ICD-10-CM | POA: Diagnosis not present

## 2022-04-28 DIAGNOSIS — Z7901 Long term (current) use of anticoagulants: Secondary | ICD-10-CM | POA: Diagnosis not present

## 2022-04-28 LAB — PROTIME-INR
INR: 2.3 — ABNORMAL HIGH (ref 0.8–1.2)
Prothrombin Time: 25.1 seconds — ABNORMAL HIGH (ref 11.4–15.2)

## 2022-04-28 LAB — BASIC METABOLIC PANEL
Anion gap: 11 (ref 5–15)
BUN: 34 mg/dL — ABNORMAL HIGH (ref 8–23)
CO2: 22 mmol/L (ref 22–32)
Calcium: 8.5 mg/dL — ABNORMAL LOW (ref 8.9–10.3)
Chloride: 104 mmol/L (ref 98–111)
Creatinine, Ser: 1.33 mg/dL — ABNORMAL HIGH (ref 0.44–1.00)
GFR, Estimated: 40 mL/min — ABNORMAL LOW (ref >60)
Glucose, Bld: 97 mg/dL (ref 70–99)
Potassium: 3.7 mmol/L (ref 3.5–5.1)
Sodium: 137 mmol/L (ref 135–145)

## 2022-04-28 LAB — PHOSPHORUS: Phosphorus: 3.3 mg/dL (ref 2.5–4.6)

## 2022-04-28 LAB — MAGNESIUM: Magnesium: 2 mg/dL (ref 1.7–2.4)

## 2022-04-28 NOTE — Care Management Important Message (Signed)
Important Message  Patient Details  Name: Hannah [name] MRN: [id number] Date of Birth: January 19, 1940   Medicare Important Message Given:  Yes     Renie Ora 04/28/2022, 10:54 AM

## 2022-04-28 NOTE — Progress Notes (Signed)
PROGRESS NOTE    Hannah [name]  WUJ:[id number] DOB: 28-Oct-1940 DOA: 04/24/2022  PCP: [name], MD   Brief Narrative:  This 82 yrs old female with medical history significant of pulmonary hypertension, CAD, hypertension, paroxysmal atrial fibrillation, PE on Coumadin, type 2 diabetes, CKD 3A, hypothyroidism and hyperlipidemia who presents with right lower extremity weakness. Patient believes symptoms happened around 8pm.  She was brought in the ED as a stroke code, feels mid-chest tightness since being in ED but thinks due to anxiety. Also has notable right UE intention tremor she reports started 2 days ago.  Neurology  and Cardiology is  consulted.  She is admitted for further evaluation.   Assessment & Plan:   Principal Problem:   Right sided weakness Active Problems:   DM2 (diabetes mellitus, type 2)   Hyperlipidemia   Hypothyroid   History of pulmonary embolism   Anticoagulated on warfarin   Chronic kidney disease, stage 3a (HCC)   CAD (coronary artery disease)   PAF (paroxysmal atrial fibrillation)   HTN (hypertension)   Chronic systolic CHF (congestive heart failure)   S/P colostomy   NSTEMI (non-ST elevated myocardial infarction)   Right sided weakness: - She presented with RUE weakness on exam with right facial drop. Concerning for stroke.  - MRI brain without acute infarct noted - CTA head and neck unremarkable, reviewed - 2d echo with EF 30%, wall motion abnormalities noted, no shunt seen.  - Continue atorvastatin - Cont anticoagulation with plan to transition to heparin gtt when INR<2 then resumption of coumadin once OK per Cardiology - Hb A1c 7.2, LDL 56 undergoal - PT/OT/SLT -Discussed case with Neurology on call. Stroke w/u essentially complete. Rec to continue with therapeutic anticoagulation per above and f/u with Neurology in 4-6 weeks after discharge.   NSTEMI: -LHC in 2022 with non-obstructive disease of LAD OR CIRCUMFLEX. Mild plaque to large,  dominant RCA.  -initial trop 1600 on 4/22 with pt complaining of "indigestion." Trop did trend up to over 2200 -Chest discomfort eventually improved after NTG x 3 -Consulted Cardiology, appreciate recs -2d echo noted per above. Given echo results, Cardio plans for heart cath once INR improves. -Left heart cath most likely tomorrow when INR below 2.   S/P colostomy -secondary to hx of large bowel stricture, s/p colectomy . -daily colostomy care   Chronic systolic CHF (congestive heart failure) -Per cardiology, giving low dose metoprolol per Cardiology. No room for entresto, spironolactone -Consider farxiga prior to d/c -seems euvolemic at this time.   Essential hypertension: Continue metoprolol 12.5 mg twice daily.   PAF (paroxysmal atrial fibrillation): -Had been on warfarin PTA -Per Cardiology, hold coumadin, transition to heparin gtt once INR<2   Chronic kidney disease, stage 3a (HCC): -Creatinine stable at baseline. -Avoid nephrotoxic medications.   History of pulmonary embolism: -On warfarin with goal of 2.5-3.5 PTA -Neurology recommends continuing anticoagulation per above -Per Cardiology, rec to hold coumadin and transition to heparin gtt once INR<2   Hypothyroidism: -Continue levothyroxine   Hyperlipidemia: -Continue statin   DM2 (diabetes mellitus, type 2): -no hyperglycemia -remote A1C 08/2021 was controlled 6.7, repeat A1c 7.2   DVT prophylaxis: Heparin IV Code Status: Full code Family Communication: No family at bed side. Disposition Plan:  Status is: Inpatient Remains inpatient appropriate because: Admitted for stroke workup which is unremarkable.  Patient remains on anticoagulation plan is to have left heart cath once INR below 2.   Consultants:  Cardiology Neurology  Procedures: CT head CTA head and  neck Antimicrobials:  Anti-infectives (From admission, onward)    None      Subjective: Patient was seen and examined at bedside.  Overnight  events noted. Patient reports doing better.  Denies any chest pain.  INR is 2.3 She is tentatively scheduled for left heart cath tomorrow if INR below 2.  Objective: Vitals:   04/28/22 0015 04/28/22 0323 04/28/22 0806 04/28/22 1052  BP: 114/67 104/64 103/64 111/69  Pulse: 72 73 73 68  Resp: 16 16 16    Temp: 97.6 F (36.4 C) 97.7 F (36.5 C) 97.7 F (36.5 C)   TempSrc: Oral Oral Oral   SpO2: 96% 95% 95%   Weight:      Height:        Intake/Output Summary (Last 24 hours) at 04/28/2022 1348 Last data filed at 04/28/2022 1056 Gross per 24 hour  Intake 300 ml  Output 600 ml  Net -300 ml   Filed Weights   04/25/22 0249 04/25/22 1800  Weight: 88.7 kg 93.8 kg    Examination:  General exam: Appears comfortable, deconditioned, not in any acute distress. Respiratory system: Clear to auscultation. Respiratory effort normal.  RR 15 Cardiovascular system: S1 & S2 heard, RRR. No JVD, murmurs, rubs, gallops or clicks.  Gastrointestinal system: Abdomen is soft, non tender, non distended, BS+ Central nervous system: Alert and oriented X 3. No focal neurological deficits. Extremities: Symmetric 5 x 5 power. Skin: No rashes, lesions or ulcers Psychiatry: Judgement and insight appear normal. Mood & affect appropriate.     Data Reviewed: I have personally reviewed following labs and imaging studies  CBC: Recent Labs  Lab 04/24/22 2321 04/24/22 2336 04/26/22 0501 04/27/22 0224  WBC 11.1*  --  8.9 8.4  NEUTROABS 8.0*  --   --   --   HGB 15.5* 17.7* 13.7 13.7  HCT 50.4* 52.0* 44.3 43.7  MCV 85.7  --  86.0 84.7  PLT 261  --  224 233   Basic Metabolic Panel: Recent Labs  Lab 04/24/22 2321 04/24/22 2336 04/26/22 0501 04/27/22 0224 04/28/22 0316  NA 139 139 137 135 137  K 4.1 4.4 3.7 3.6 3.7  CL 106 109 103 103 104  CO2 19*  --  23 21* 22  GLUCOSE 122* 122* 115* 116* 97  BUN 26* 35* 33* 34* 34*  CREATININE 1.33* 1.30* 1.58* 1.53* 1.33*  CALCIUM 9.0  --  8.4* 8.5* 8.5*   MG  --   --   --   --  2.0  PHOS  --   --   --   --  3.3   GFR: Estimated Creatinine Clearance: 39.7 mL/min (A) (by C-G formula based on SCr of 1.33 mg/dL (H)). Liver Function Tests: Recent Labs  Lab 04/24/22 2321 04/26/22 0501 04/27/22 0224  AST 22 20 18   ALT 14 11 12   ALKPHOS 69 51 53  BILITOT 1.0 1.1 1.1  PROT 7.7 6.0* 5.9*  ALBUMIN 3.8 3.0* 2.8*   No results for input(s): "LIPASE", "AMYLASE" in the last 168 hours. No results for input(s): "AMMONIA" in the last 168 hours. Coagulation Profile: Recent Labs  Lab 04/24/22 2321 04/26/22 0501 04/27/22 0224 04/28/22 0316  INR 3.0* 3.8* 3.0* 2.3*   Cardiac Enzymes: No results for input(s): "CKTOTAL", "CKMB", "CKMBINDEX", "TROPONINI" in the last 168 hours. BNP (last 3 results) No results for input(s): "PROBNP" in the last 8760 hours. HbA1C: No results for input(s): "HGBA1C" in the last 72 hours.  CBG: No results for input(s): "GLUCAP"  in the last 168 hours. Lipid Profile: No results for input(s): "CHOL", "HDL", "LDLCALC", "TRIG", "CHOLHDL", "LDLDIRECT" in the last 72 hours.  Thyroid Function Tests: No results for input(s): "TSH", "T4TOTAL", "FREET4", "T3FREE", "THYROIDAB" in the last 72 hours. Anemia Panel: No results for input(s): "VITAMINB12", "FOLATE", "FERRITIN", "TIBC", "IRON", "RETICCTPCT" in the last 72 hours. Sepsis Labs: No results for input(s): "PROCALCITON", "LATICACIDVEN" in the last 168 hours.  No results found for this or any previous visit (from the past 240 hour(s)).   Radiology Studies: No results found.  Scheduled Meds:  aspirin EC  81 mg Oral Daily   atorvastatin  40 mg Oral Daily   levothyroxine  25 mcg Oral Q0600   metoprolol tartrate  12.5 mg Oral BID   Continuous Infusions:   LOS: 3 days    Time spent: 35 mins    Willeen Niece, MD [facility]   If 7PM-7AM, please contact night-coverage

## 2022-04-28 NOTE — Progress Notes (Signed)
Rounding Note    Patient Name: Hannah [name] Date of Encounter: 04/28/2022  [facility] Cardiologist: Dietrich Pates, MD   Subjective   No acute events overnight. No new concerns. Awaiting INR drift down for cath. We discussed cath procedure at length, see below.  Inpatient Medications    Scheduled Meds:  aspirin EC  81 mg Oral Daily   atorvastatin  40 mg Oral Daily   levothyroxine  25 mcg Oral Q0600   metoprolol tartrate  12.5 mg Oral BID   Continuous Infusions:  PRN Meds: acetaminophen **OR** acetaminophen (TYLENOL) oral liquid 160 mg/5 mL **OR** acetaminophen, morphine injection, nitroGLYCERIN   Vital Signs    Vitals:   04/28/22 0015 04/28/22 0323 04/28/22 0806 04/28/22 1052  BP: 114/67 104/64 103/64 111/69  Pulse: 72 73 73 68  Resp: Temp: 97.6 F (36.4 C) 97.7 F (36.5 C) 97.7 F (36.5 C)   TempSrc: Oral Oral Oral   SpO2: 96% 95% 95%   Weight:      Height:        Intake/Output Summary (Last 24 hours) at 04/28/2022 1058 Last data filed at 04/28/2022 1056 Gross per 24 hour  Intake 300 ml  Output 150 ml  Net 150 ml      04/25/2022    6:00 PM 04/25/2022    2:49 AM 12/15/2021    2:53 PM  Last 3 Weights  Weight (lbs) 206 lb 12.7 oz 195 lb 9.6 oz 198 lb 12.8 oz  Weight (kg) 93.8 kg 88.724 kg 90.175 kg      Telemetry    SR - Personally Reviewed  ECG    No new since 4/22 - Personally Reviewed  Physical Exam   GEN: Well nourished, well developed in no acute distress NECK: No JVD CARDIAC: regular rhythm, normal S1 and S2, no rubs or gallops. No murmur. VASCULAR: Radial pulses 2+ bilaterally.  RESPIRATORY:  Clear to auscultation without rales, wheezing or rhonchi  ABDOMEN: Soft, non-tender, non-distended MUSCULOSKELETAL:  Moves all 4 limbs independently SKIN: Warm and dry, no edema NEUROLOGIC:  mild R facial droop. PSYCHIATRIC:  Normal affect    Labs    High Sensitivity Troponin:   Recent Labs  Lab 04/25/22 0549  04/25/22 0840 04/25/22 1024 04/25/22 1216  TROPONINIHS 1,645* 1,869* 1,919* 2,278*     Chemistry Recent Labs  Lab 04/24/22 2321 04/24/22 2336 04/26/22 0501 04/27/22 0224 04/28/22 0316  NA 139   < > 137 135 137  K 4.1   < > 3.7 3.6 3.7  CL 106   < > 103 103 104  CO2 19*  --  23 21* 22  GLUCOSE 122*   < > 115* 116* 97  BUN 26*   < > 33* 34* 34*  CREATININE 1.33*   < > 1.58* 1.53* 1.33*  CALCIUM 9.0  --  8.4* 8.5* 8.5*  MG  --   --   --   --  2.0  PROT 7.7  --  6.0* 5.9*  --   ALBUMIN 3.8  --  3.0* 2.8*  --   AST 22  --  20 18  --   ALT 14  --  11 12  --   ALKPHOS 69  --  51 53  --   BILITOT 1.0  --  1.1 1.1  --   GFRNONAA 40*  --  33* 34* 40*  ANIONGAP 14  --  < > =  values in this interval not displayed.    Lipids  Recent Labs  Lab 04/25/22 0549  CHOL 118  TRIG 51  HDL 49  LDLCALC 59  CHOLHDL 2.4    Hematology Recent Labs  Lab 04/24/22 2321 04/24/22 2336 04/26/22 0501 04/27/22 0224  WBC 11.1*  --  8.9 8.4  RBC 5.88*  --  5.15* 5.16*  HGB 15.5* 17.7* 13.7 13.7  HCT 50.4* 52.0* 44.3 43.7  MCV 85.7  --  86.0 84.7  MCH 26.4  --  26.6 26.6  MCHC 30.8  --  30.9 31.4  RDW 20.0*  --  19.1* 18.8*  PLT 261  --  224 233   Thyroid No results for input(s): "TSH", "FREET4" in the last 168 hours.  BNPNo results for input(s): "BNP", "PROBNP" in the last 168 hours.  DDimer No results for input(s): "DDIMER" in the last 168 hours.   Radiology    No results found.  Cardiac Studies   Cardiac Studies & Procedures   CARDIAC CATHETERIZATION  CARDIAC CATHETERIZATION 01/01/2021  Narrative   Prox RCA to Mid RCA lesion is 20% stenosed.  Mild plaque in the mid segment of the large, dominant RCA No obstructive disease in the LAD or Circumflex Normal LV filling pressure.  Recommendations: Her cardiomyopathy is non-ischemic. Consider her uncontrolled HTN as a cause of her cardiomyopathy (SBP 220 mmHg on arrival to the cath lab earlier this week and SBP of  195 on arrival to the cath lab today). Titrate beta blocker. Add additional medical therapy for better BP control.  Findings Coronary Findings Diagnostic  Dominance: Right  Left Anterior Descending Vessel is large.  Left Circumflex Vessel is large.  Right Coronary Artery Vessel is large. Prox RCA to Mid RCA lesion is 20% stenosed.  Intervention  No interventions have been documented.   CARDIAC CATHETERIZATION 12/30/2020  Narrative Normal right heart pressures Unsuccessful attempt at left heart cath from the right radial artery due to radial artery loop.  Recommendations: Will plan to bring her back for left heart catheterization from the right femoral artery approach tomorrow if her INR is less than 1.7. Given her elevated INR today, I did not feel that it was safe to proceed with femoral artery access today. Her right radial artery should not be used for access for future cardiac caths. NPO at midnight. Will not resume IV heparin tonight since we did obtain access into the right femoral vein today.   STRESS TESTS  NM MYOCAR MULTI W/SPECT W 12/16/2018  Narrative  There was no ST segment deviation noted during stress.  The study is normal.  This is a low risk study.  The left ventricular ejection fraction is normal (55-65%).  Nuclear stress EF: 57%.  No T wave inversion was noted during stress.  1. Is a normal myocardial perfusion imaging study without evidence of ischemia or infarction. 2.  Significant diaphragm attenuation noted. 3.  Normal left-ventricular ejection fraction without wall motion normality.  Gerri Spore T. Flora Lipps, MD [facility] [address] [place], [place] [address] [phone] 4:27 PM   ECHOCARDIOGRAM  ECHOCARDIOGRAM COMPLETE 04/25/2022  Narrative ECHOCARDIOGRAM REPORT    Patient Name:   Hannah [name] Date of Exam: 04/25/2022 Medical Rec #:  [id number]    Height:       67.0 in Accession #:    [id number]    Weight:       195.6 lb Date of Birth:  08/20/1940    BSA:  2.003 m Patient Age:    81 years     BP:           126/75 mmHg Patient Gender: F            HR:           102 bpm. Exam Location:  Inpatient  Procedure: 2D Echo, Color Doppler, Cardiac Doppler and Intracardiac Opacification Agent  REPORT CONTAINS CRITICAL RESULT  Indications:    Stroke  History:        Patient has prior history of Echocardiogram examinations, most recent 05/24/2021. HFrEF and CHF, Pulmonary HTN, Arrythmias:Atrial Fibrillation, Signs/Symptoms:Syncope; Risk Factors:Hypertension, Dyslipidemia and Diabetes. CKD.  Sonographer:    Milda Smart Referring Phys: [id number] CHING T TU   Sonographer Comments: Technically difficult study due to poor echo windows. Image acquisition challenging due to patient body habitus and Image acquisition challenging due to respiratory motion. IMPRESSIONS   1. Left ventricular ejection fraction, by estimation, is 30%. The left ventricle has moderately decreased function. The left ventricle demonstrates regional wall motion abnormalities with hyperkinetic basal LV segments and akinesis of the mid to apical anteroseptal, mid-apical inferoseptal, apical lateral, apical anterior, and mid-apical inferior walls as well as the true apex. With hyperdynamic basal LV contraction, there is mitral valve systolic anterior motion with LV outflow tract gradient, peak 45 mmHg. These findings are consistent with large wrap-around LAD territory MI or a stress (Takotsubo-type) cardiomyopathy. There is mild left ventricular hypertrophy. Left ventricular diastolic parameters are consistent with Grade I diastolic dysfunction (impaired relaxation). 2. Right ventricular systolic function is normal. The right ventricular size is normal. There is normal pulmonary artery systolic pressure. The estimated right ventricular systolic pressure is 30.2 mmHg. 3. There was systolic anterior motion of the anterior  mitral leaflet. The mitral valve is abnormal. Mild to moderate mitral valve regurgitation. No evidence of mitral stenosis. 4. The aortic valve is tricuspid. There is mild calcification of the aortic valve. Aortic valve regurgitation is not visualized. No aortic stenosis is present. 5. The inferior vena cava is normal in size with greater than 50% respiratory variability, suggesting right atrial pressure of 3 mmHg.  FINDINGS Left Ventricle: Left ventricular ejection fraction, by estimation, is 30%. The left ventricle has moderately decreased function. The left ventricle demonstrates regional wall motion abnormalities. Definity contrast agent was given IV to delineate the left ventricular endocardial borders. The left ventricular internal cavity size was normal in size. There is mild left ventricular hypertrophy. Left ventricular diastolic parameters are consistent with Grade I diastolic dysfunction (impaired relaxation).  Right Ventricle: The right ventricular size is normal. No increase in right ventricular wall thickness. Right ventricular systolic function is normal. There is normal pulmonary artery systolic pressure. The tricuspid regurgitant velocity is 2.61 m/s, and with an assumed right atrial pressure of 3 mmHg, the estimated right ventricular systolic pressure is 30.2 mmHg.  Left Atrium: Left atrial size was normal in size.  Right Atrium: Right atrial size was normal in size.  Pericardium: There is no evidence of pericardial effusion.  Mitral Valve: There was systolic anterior motion of the anterior mitral leaflet. The mitral valve is abnormal. Mild to moderate mitral annular calcification. Mild to moderate mitral valve regurgitation. No evidence of mitral valve stenosis.  Tricuspid Valve: The tricuspid valve is normal in structure. Tricuspid valve regurgitation is trivial.  Aortic Valve: The aortic valve is tricuspid. There is mild calcification of the aortic valve. Aortic valve  regurgitation is not visualized. No aortic stenosis is present.  Pulmonic Valve: The pulmonic valve was normal in structure. Pulmonic valve regurgitation is not visualized.  Aorta: The aortic root is normal in size and structure.  Venous: The inferior vena cava is normal in size with greater than 50% respiratory variability, suggesting right atrial pressure of 3 mmHg.  IAS/Shunts: No atrial level shunt detected by color flow Doppler.   LEFT VENTRICLE PLAX 2D LVIDd:         3.00 cm LVIDs:         2.10 cm LV PW:         1.20 cm LV IVS:        1.20 cm LVOT diam:     2.00 cm LVOT Area:     3.14 cm   LEFT ATRIUM             Index        RIGHT ATRIUM          Index LA diam:        3.10 cm 1.55 cm/m   RA Area:     8.57 cm LA Vol (A2C):   30.6 ml 15.28 ml/m  RA Volume:   13.30 ml 6.64 ml/m LA Vol (A4C):   16.8 ml 8.39 ml/m LA Biplane Vol: 23.6 ml 11.78 ml/m  AORTA Ao Root diam: 2.80 cm Ao Asc diam:  2.90 cm  TRICUSPID VALVE TR Peak grad:   27.2 mmHg TR Mean grad:   15.0 mmHg TR Vmax:        261.00 cm/s TR Vmean:       189.0 cm/s  SHUNTS Systemic Diam: 2.00 cm  Dalton McleanMD Electronically signed by Wilfred Lacy Signature Date/Time: 04/25/2022/4:12:17 PM    Final              Patient Profile     82 y.o. female with a hx of massive PE (lifelong anticoagulation), prior nonischemic cardiomyopathy (recovered EF) with non-ischemic cardiomyopathy, minimal nonobstructive CAD, paroxysmal afib, hypertension, hyperlipidemia, who is being followed for the evaluation of elevated troponin. Hannah [name] presented to the [facility] ED on 4/21 with EMS for evaluation of symptoms of right lower extremity weakness, last normal around 8pm on 4/21. Patient found that she was unable to lift her right leg and also had associated numbness. Patient denied other neurological abnormalities. She did report some generalized chest discomfort. Initial workup noted hypertension with BP 180s/90s. Given  leg weakness, a code stroke was called and patient was seen by teleneurology. CT head negative for acute intracranial abnormality and neurology recommended admission for further stroke workup with brain MRI. Permissive hypertension also recommended. Initial labwork noted creatinine 1.33, WBC 11.1. Given chest discomfort, troponin also checked, found elevated at 1645->1869.   Assessment & Plan    Reduced EF Elevated troponins History of prior systolic dysfunction with recovery of EF -no chest pain. Pattern could be takotsubo vs. Distal large LAD or multivessel disease -previously cath in 2022 with minimal 20% RCA -long standing IVCD/LBBB on ECG.  -prior cardiomyopathy thought to be 2/2 hypertension at that time. -INR drifting down, given rate, should be around 1.5 tomorrow. Will plan for cath tomorrow. We discussed at length, and she is amenable. I also attempted to call her daughter [name] to update her; left message, will try again later -on metoprolol low dose with hold parameters -no room for home entresto, spironolactone given low blood pressures -consider restart farxiga prior to discharge  Shared Decision Making/Informed Consent The risks [stroke (1 in 1000), death (1 in 1000),  kidney failure [usually temporary] (1 in 500), bleeding (1 in 200), allergic reaction [possibly serious] (1 in 200)], benefits (diagnostic support and management of coronary artery disease) and alternatives of a cardiac catheterization were discussed in detail with Hannah [name] and she is willing to proceed.    Minimal nonobstructive CAD by cath -Continue atorvastatin, started low dose aspirin in the interim. If no obstructive CAD, this can be stopped once she is restarted on coumadin.   History of PE Paroxysmal atrial fibrillation -chadsvasc 6, on coumadin long term, holding with plans for cath. Would not reverse. -INR 2.3 today, once below 2 (ideally close to 1.5) can consider cath. Hopeful for tomorrow  For  questions or updates, please contact [facility] Please consult [email or web address] for contact info under     Signed, Jodelle Red, MD  04/28/2022, 10:58 AM

## 2022-04-28 NOTE — H&P (View-Only) (Signed)
 Rounding Note    Patient Name: Hannah [name] Date of Encounter: 04/28/2022  [facility] Cardiologist: Paula Ross, MD   Subjective   No acute events overnight. No new concerns. Awaiting INR drift down for cath. We discussed cath procedure at length, see below.  Inpatient Medications    Scheduled Meds:  aspirin EC  81 mg Oral Daily   atorvastatin  40 mg Oral Daily   levothyroxine  25 mcg Oral Q0600   metoprolol tartrate  12.5 mg Oral BID   Continuous Infusions:  PRN Meds: acetaminophen **OR** acetaminophen (TYLENOL) oral liquid 160 mg/5 mL **OR** acetaminophen, morphine injection, nitroGLYCERIN   Vital Signs    Vitals:   04/28/22 0015 04/28/22 0323 04/28/22 0806 04/28/22 1052  BP: 114/67 104/64 103/64 111/69  Pulse: 72 73 73 68  Resp: 16 16 16   Temp: 97.6 F (36.4 C) 97.7 F (36.5 C) 97.7 F (36.5 C)   TempSrc: Oral Oral Oral   SpO2: 96% 95% 95%   Weight:      Height:        Intake/Output Summary (Last 24 hours) at 04/28/2022 1058 Last data filed at 04/28/2022 1056 Gross per 24 hour  Intake 300 ml  Output 150 ml  Net 150 ml      04/25/2022    6:00 PM 04/25/2022    2:49 AM 12/15/2021    2:53 PM  Last 3 Weights  Weight (lbs) 206 lb 12.7 oz 195 lb 9.6 oz 198 lb 12.8 oz  Weight (kg) 93.8 kg 88.724 kg 90.175 kg      Telemetry    SR - Personally Reviewed  ECG    No new since 4/22 - Personally Reviewed  Physical Exam   GEN: Well nourished, well developed in no acute distress NECK: No JVD CARDIAC: regular rhythm, normal S1 and S2, no rubs or gallops. No murmur. VASCULAR: Radial pulses 2+ bilaterally.  RESPIRATORY:  Clear to auscultation without rales, wheezing or rhonchi  ABDOMEN: Soft, non-tender, non-distended MUSCULOSKELETAL:  Moves all 4 limbs independently SKIN: Warm and dry, no edema NEUROLOGIC:  mild R facial droop. PSYCHIATRIC:  Normal affect    Labs    High Sensitivity Troponin:   Recent Labs  Lab 04/25/22 0549  04/25/22 0840 04/25/22 1024 04/25/22 1216  TROPONINIHS 1,645* 1,869* 1,919* 2,278*     Chemistry Recent Labs  Lab 04/24/22 2321 04/24/22 2336 04/26/22 0501 04/27/22 0224 04/28/22 0316  NA 139   < > 137 135 137  K 4.1   < > 3.7 3.6 3.7  CL 106   < > 103 103 104  CO2 19*  --  23 21* 22  GLUCOSE 122*   < > 115* 116* 97  BUN 26*   < > 33* 34* 34*  CREATININE 1.33*   < > 1.58* 1.53* 1.33*  CALCIUM 9.0  --  8.4* 8.5* 8.5*  MG  --   --   --   --  2.0  PROT 7.7  --  6.0* 5.9*  --   ALBUMIN 3.8  --  3.0* 2.8*  --   AST 22  --  20 18  --   ALT 14  --  11 12  --   ALKPHOS 69  --  51 53  --   BILITOT 1.0  --  1.1 1.1  --   GFRNONAA 40*  --  33* 34* 40*  ANIONGAP 14  --  11 11 11   < > =   values in this interval not displayed.    Lipids  Recent Labs  Lab 04/25/22 0549  CHOL 118  TRIG 51  HDL 49  LDLCALC 59  CHOLHDL 2.4    Hematology Recent Labs  Lab 04/24/22 2321 04/24/22 2336 04/26/22 0501 04/27/22 0224  WBC 11.1*  --  8.9 8.4  RBC 5.88*  --  5.15* 5.16*  HGB 15.5* 17.7* 13.7 13.7  HCT 50.4* 52.0* 44.3 43.7  MCV 85.7  --  86.0 84.7  MCH 26.4  --  26.6 26.6  MCHC 30.8  --  30.9 31.4  RDW 20.0*  --  19.1* 18.8*  PLT 261  --  224 233   Thyroid No results for input(s): "TSH", "FREET4" in the last 168 hours.  BNPNo results for input(s): "BNP", "PROBNP" in the last 168 hours.  DDimer No results for input(s): "DDIMER" in the last 168 hours.   Radiology    No results found.  Cardiac Studies   Cardiac Studies & Procedures   CARDIAC CATHETERIZATION  CARDIAC CATHETERIZATION 01/01/2021  Narrative   Prox RCA to Mid RCA lesion is 20% stenosed.  Mild plaque in the mid segment of the large, dominant RCA No obstructive disease in the LAD or Circumflex Normal LV filling pressure.  Recommendations: Her cardiomyopathy is non-ischemic. Consider her uncontrolled HTN as a cause of her cardiomyopathy (SBP 220 mmHg on arrival to the cath lab earlier this week and SBP of  195 on arrival to the cath lab today). Titrate beta blocker. Add additional medical therapy for better BP control.  Findings Coronary Findings Diagnostic  Dominance: Right  Left Anterior Descending Vessel is large.  Left Circumflex Vessel is large.  Right Coronary Artery Vessel is large. Prox RCA to Mid RCA lesion is 20% stenosed.  Intervention  No interventions have been documented.   CARDIAC CATHETERIZATION 12/30/2020  Narrative Normal right heart pressures Unsuccessful attempt at left heart cath from the right radial artery due to radial artery loop.  Recommendations: Will plan to bring her back for left heart catheterization from the right femoral artery approach tomorrow if her INR is less than 1.7. Given her elevated INR today, I did not feel that it was safe to proceed with femoral artery access today. Her right radial artery should not be used for access for future cardiac caths. NPO at midnight. Will not resume IV heparin tonight since we did obtain access into the right femoral vein today.   STRESS TESTS  NM MYOCAR MULTI W/SPECT W 12/16/2018  Narrative  There was no ST segment deviation noted during stress.  The study is normal.  This is a low risk study.  The left ventricular ejection fraction is normal (55-65%).  Nuclear stress EF: 57%.  No T wave inversion was noted during stress.  1. Is a normal myocardial perfusion imaging study without evidence of ischemia or infarction. 2.  Significant diaphragm attenuation noted. 3.  Normal left-ventricular ejection fraction without wall motion normality.  Refugio T. O'Neal, MD [place]  [facility] [address], [address] [place], [place] [address] [phone] 4:27 PM   ECHOCARDIOGRAM  ECHOCARDIOGRAM COMPLETE 04/25/2022  Narrative ECHOCARDIOGRAM REPORT    Patient Name:   Hannah [name] Date of Exam: 04/25/2022 Medical Rec #:  [id number]    Height:       67.0 in Accession #:    [id number]    Weight:       195.6 lb Date of Birth:  06/12/1940    BSA:            2.003 m Patient Age:    81 years     BP:           126/75 mmHg Patient Gender: F            HR:           102 bpm. Exam Location:  Inpatient  Procedure: 2D Echo, Color Doppler, Cardiac Doppler and Intracardiac Opacification Agent  REPORT CONTAINS CRITICAL RESULT  Indications:    Stroke  History:        Patient has prior history of Echocardiogram examinations, most recent 05/24/2021. HFrEF and CHF, Pulmonary HTN, Arrythmias:Atrial Fibrillation, Signs/Symptoms:Syncope; Risk Factors:Hypertension, Dyslipidemia and Diabetes. CKD.  Sonographer:    Shannon O'Grady Referring Phys: [id number] CHING T TU   Sonographer Comments: Technically difficult study due to poor echo windows. Image acquisition challenging due to patient body habitus and Image acquisition challenging due to respiratory motion. IMPRESSIONS   1. Left ventricular ejection fraction, by estimation, is 30%. The left ventricle has moderately decreased function. The left ventricle demonstrates regional wall motion abnormalities with hyperkinetic basal LV segments and akinesis of the mid to apical anteroseptal, mid-apical inferoseptal, apical lateral, apical anterior, and mid-apical inferior walls as well as the true apex. With hyperdynamic basal LV contraction, there is mitral valve systolic anterior motion with LV outflow tract gradient, peak 45 mmHg. These findings are consistent with large wrap-around LAD territory MI or a stress (Takotsubo-type) cardiomyopathy. There is mild left ventricular hypertrophy. Left ventricular diastolic parameters are consistent with Grade I diastolic dysfunction (impaired relaxation). 2. Right ventricular systolic function is normal. The right ventricular size is normal. There is normal pulmonary artery systolic pressure. The estimated right ventricular systolic pressure is 30.2 mmHg. 3. There was systolic anterior motion of the anterior  mitral leaflet. The mitral valve is abnormal. Mild to moderate mitral valve regurgitation. No evidence of mitral stenosis. 4. The aortic valve is tricuspid. There is mild calcification of the aortic valve. Aortic valve regurgitation is not visualized. No aortic stenosis is present. 5. The inferior vena cava is normal in size with greater than 50% respiratory variability, suggesting right atrial pressure of 3 mmHg.  FINDINGS Left Ventricle: Left ventricular ejection fraction, by estimation, is 30%. The left ventricle has moderately decreased function. The left ventricle demonstrates regional wall motion abnormalities. Definity contrast agent was given IV to delineate the left ventricular endocardial borders. The left ventricular internal cavity size was normal in size. There is mild left ventricular hypertrophy. Left ventricular diastolic parameters are consistent with Grade I diastolic dysfunction (impaired relaxation).  Right Ventricle: The right ventricular size is normal. No increase in right ventricular wall thickness. Right ventricular systolic function is normal. There is normal pulmonary artery systolic pressure. The tricuspid regurgitant velocity is 2.61 m/s, and with an assumed right atrial pressure of 3 mmHg, the estimated right ventricular systolic pressure is 30.2 mmHg.  Left Atrium: Left atrial size was normal in size.  Right Atrium: Right atrial size was normal in size.  Pericardium: There is no evidence of pericardial effusion.  Mitral Valve: There was systolic anterior motion of the anterior mitral leaflet. The mitral valve is abnormal. Mild to moderate mitral annular calcification. Mild to moderate mitral valve regurgitation. No evidence of mitral valve stenosis.  Tricuspid Valve: The tricuspid valve is normal in structure. Tricuspid valve regurgitation is trivial.  Aortic Valve: The aortic valve is tricuspid. There is mild calcification of the aortic valve. Aortic valve  regurgitation is not visualized. No aortic stenosis is present.    Pulmonic Valve: The pulmonic valve was normal in structure. Pulmonic valve regurgitation is not visualized.  Aorta: The aortic root is normal in size and structure.  Venous: The inferior vena cava is normal in size with greater than 50% respiratory variability, suggesting right atrial pressure of 3 mmHg.  IAS/Shunts: No atrial level shunt detected by color flow Doppler.   LEFT VENTRICLE PLAX 2D LVIDd:         3.00 cm LVIDs:         2.10 cm LV PW:         1.20 cm LV IVS:        1.20 cm LVOT diam:     2.00 cm LVOT Area:     3.14 cm   LEFT ATRIUM             Index        RIGHT ATRIUM          Index LA diam:        3.10 cm 1.55 cm/m   RA Area:     8.57 cm LA Vol (A2C):   30.6 ml 15.28 ml/m  RA Volume:   13.30 ml 6.64 ml/m LA Vol (A4C):   16.8 ml 8.39 ml/m LA Biplane Vol: 23.6 ml 11.78 ml/m  AORTA Ao Root diam: 2.80 cm Ao Asc diam:  2.90 cm  TRICUSPID VALVE TR Peak grad:   27.2 mmHg TR Mean grad:   15.0 mmHg TR Vmax:        261.00 cm/s TR Vmean:       189.0 cm/s  SHUNTS Systemic Diam: 2.00 cm  Dalton McleanMD Electronically signed by Dalton McleanMD Signature Date/Time: 04/25/2022/4:12:17 PM    Final              Patient Profile     81 y.o. female with a hx of massive PE (lifelong anticoagulation), prior nonischemic cardiomyopathy (recovered EF) with non-ischemic cardiomyopathy, minimal nonobstructive CAD, paroxysmal afib, hypertension, hyperlipidemia, who is being followed for the evaluation of elevated troponin. Ms. Raiche presented to the [facility] ED on 4/21 with EMS for evaluation of symptoms of right lower extremity weakness, last normal around 8pm on 4/21. Patient found that she was unable to lift her right leg and also had associated numbness. Patient denied other neurological abnormalities. She did report some generalized chest discomfort. Initial workup noted hypertension with BP 180s/90s. Given  leg weakness, a code stroke was called and patient was seen by teleneurology. CT head negative for acute intracranial abnormality and neurology recommended admission for further stroke workup with brain MRI. Permissive hypertension also recommended. Initial labwork noted creatinine 1.33, WBC 11.1. Given chest discomfort, troponin also checked, found elevated at 1645->1869.   Assessment & Plan    Reduced EF Elevated troponins History of prior systolic dysfunction with recovery of EF -no chest pain. Pattern could be takotsubo vs. Distal large LAD or multivessel disease -previously cath in 2022 with minimal 20% RCA -long standing IVCD/LBBB on ECG.  -prior cardiomyopathy thought to be 2/2 hypertension at that time. -INR drifting down, given rate, should be around 1.5 tomorrow. Will plan for cath tomorrow. We discussed at length, and she is amenable. I also attempted to call her daughter Michelle to update her; left message, will try again later -on metoprolol low dose with hold parameters -no room for home entresto, spironolactone given low blood pressures -consider restart farxiga prior to discharge  Shared Decision Making/Informed Consent The risks [stroke (1 in 1000), death (1 in 1000),   kidney failure [usually temporary] (1 in 500), bleeding (1 in 200), allergic reaction [possibly serious] (1 in 200)], benefits (diagnostic support and management of coronary artery disease) and alternatives of a cardiac catheterization were discussed in detail with Ms. Labelle and she is willing to proceed.    Minimal nonobstructive CAD by cath -Continue atorvastatin, started low dose aspirin in the interim. If no obstructive CAD, this can be stopped once she is restarted on coumadin.   History of PE Paroxysmal atrial fibrillation -chadsvasc 6, on coumadin long term, holding with plans for cath. Would not reverse. -INR 2.3 today, once below 2 (ideally close to 1.5) can consider cath. Hopeful for tomorrow  For  questions or updates, please contact [facility] Please consult [email or web address] for contact info under     Signed, Laelle Bridgett, MD  04/28/2022, 10:58 AM    

## 2022-04-28 NOTE — Progress Notes (Signed)
ANTICOAGULATION CONSULT NOTE  Pharmacy Consult for warfarin  >> heparin Indication: pulmonary embolus  Allergies  Allergen Reactions   Augmentin [Amoxicillin-Pot Clavulanate] Itching and Other (See Comments)    Severe vaginal itching   Tranxene [Clorazepate] Itching   Crestor [Rosuvastatin]     Made her sick on stomach, she is able to tolerate the zocor   Penicillins Itching and Rash    Patient Measurements: Height:  (170.2 cm) Weight: 93.8 kg (206 lb 12.7 oz) IBW/kg (Calculated) : 61.6 Heparin Dosing Weight:   Vital Signs: Temp: 97.7 F (36.5 C) (04/25 0806) Temp Source: Oral (04/25 0806) BP: 103/64 (04/25 0806) Pulse Rate: 73 (04/25 0806)  Labs: Recent Labs    04/25/22 1024 04/25/22 1216 04/26/22 0501 04/27/22 0224 04/28/22 0316  HGB  --   --  13.7 13.7  --   HCT  --   --  44.3 43.7  --   PLT  --   --  224 233  --   LABPROT  --   --  37.2* 30.9* 25.1*  INR  --   --  3.8* 3.0* 2.3*  CREATININE  --   --  1.58* 1.53* 1.33*  TROPONINIHS 1,919* 2,278*  --   --   --      Estimated Creatinine Clearance: 39.7 mL/min (A) (by C-G formula based on SCr of 1.33 mg/dL (H)).   Medical History: Past Medical History:  Diagnosis Date   Acute massive pulmonary embolism 07/13/2012   Massive PE w/ PEA arrest 07/13/12 >TNK >IVC filter >discharged on comadin     Anticoagulated on warfarin    Chronic diastolic CHF (congestive heart failure) 03/15/2017   Chronic kidney disease, stage 3a 12/15/2018   Diabetes mellitus, type 2 07/15/2012   with peripheral neuropathy   DM2 (diabetes mellitus, type 2) 07/23/2012   HFrEF (heart failure with reduced ejection fraction) 03/15/2017   Non-ischemic cardiomyopathy // Cardiac catheterization 01/01/21:  RCA prox to mid 20 // Echocardiogram 12/26/20:  EF 30-35, ant septum/apex, mid inf-sept AK; ant-lat/ant/inf HK, Gr 1 DD, mild LVH, normal RVSF, RVSP 27.2, small effusion (ant to RV), mild MR, mod TR, AV sclerosis w/o AS   Hyperlipemia  07/14/2012   Hyperlipidemia 02/25/2013   Hypertension 07/14/2012   Hypothyroid 06/06/2013   Large bowel stricture    s/p colectomy in 2016   Neuropathy 02/12/2015   Osteoarthritis    s/p hip and knee replacements   PAF (paroxysmal atrial fibrillation)    Pulmonary hypertension 07/26/2016   Syncope 10/2018   Thyroid disease     Medications:  Per anti-coag visit note on 04/13/22 Warfarin  every Wed and Friday, 2.5mg  all other days  Assessment: 20 yoF with hx AF and PE on warfarin PTA (INR goal 2.5-3.5) admitted with RLE weakness. Pt also with CP, warfarin held with plans for cardiac cath once INR trends down.  INR trending down to 2.3 today.  Goal of Therapy:  INR 2.5-3.5 (per anti-coag visit note) Heparin level 0.3-0.7 units/ml   Plan:  Hold warfarin Daily INR - heparin once INR <2  [place] Highland, PharmD, BCPS, [facility] Clinical Pharmacist [phone] Please check AMION for all [facility] Pharmacy numbers 04/28/2022

## 2022-04-29 ENCOUNTER — Encounter (HOSPITAL_COMMUNITY): Payer: Self-pay | Admitting: Internal Medicine

## 2022-04-29 ENCOUNTER — Inpatient Hospital Stay (HOSPITAL_COMMUNITY)
Admission: EM | Disposition: A | Discharge status: Home / Self Care | Payer: Self-pay | Source: Home / Self Care | Attending: Family Medicine

## 2022-04-29 DIAGNOSIS — I249 Acute ischemic heart disease, unspecified: Secondary | ICD-10-CM

## 2022-04-29 DIAGNOSIS — R531 Weakness: Secondary | ICD-10-CM | POA: Diagnosis not present

## 2022-04-29 DIAGNOSIS — R7989 Other specified abnormal findings of blood chemistry: Secondary | ICD-10-CM | POA: Diagnosis not present

## 2022-04-29 DIAGNOSIS — I429 Cardiomyopathy, unspecified: Secondary | ICD-10-CM | POA: Diagnosis not present

## 2022-04-29 DIAGNOSIS — Z7901 Long term (current) use of anticoagulants: Secondary | ICD-10-CM | POA: Diagnosis not present

## 2022-04-29 HISTORY — PX: LEFT HEART CATH AND CORONARY ANGIOGRAPHY: CATH118249

## 2022-04-29 LAB — BASIC METABOLIC PANEL
Anion gap: 10 (ref 5–15)
BUN: 30 mg/dL — ABNORMAL HIGH (ref 8–23)
CO2: 20 mmol/L — ABNORMAL LOW (ref 22–32)
Calcium: 8.6 mg/dL — ABNORMAL LOW (ref 8.9–10.3)
Chloride: 105 mmol/L (ref 98–111)
Creatinine, Ser: 1.3 mg/dL — ABNORMAL HIGH (ref 0.44–1.00)
GFR, Estimated: 42 mL/min — ABNORMAL LOW (ref >60)
Glucose, Bld: 99 mg/dL (ref 70–99)
Potassium: 4.5 mmol/L (ref 3.5–5.1)
Sodium: 135 mmol/L (ref 135–145)

## 2022-04-29 LAB — CBC
HCT: 44.2 % (ref 36.0–46.0)
Hemoglobin: 14 g/dL (ref 12.0–15.0)
MCH: 26.9 pg (ref 26.0–34.0)
MCHC: 31.7 g/dL (ref 30.0–36.0)
MCV: 85 fL (ref 80.0–100.0)
Platelets: 227 10*3/uL (ref 150–400)
RBC: 5.2 MIL/uL — ABNORMAL HIGH (ref 3.87–5.11)
RDW: 19.2 % — ABNORMAL HIGH (ref 11.5–15.5)
WBC: 8.5 10*3/uL (ref 4.0–10.5)
nRBC: 0 % (ref 0.0–0.2)

## 2022-04-29 LAB — GLUCOSE, CAPILLARY: Glucose-Capillary: 89 mg/dL (ref 70–99)

## 2022-04-29 LAB — PHOSPHORUS: Phosphorus: 3.1 mg/dL (ref 2.5–4.6)

## 2022-04-29 LAB — MAGNESIUM: Magnesium: 2.1 mg/dL (ref 1.7–2.4)

## 2022-04-29 LAB — PROTIME-INR
INR: 1.7 — ABNORMAL HIGH (ref 0.8–1.2)
Prothrombin Time: 20.2 seconds — ABNORMAL HIGH (ref 11.4–15.2)

## 2022-04-29 SURGERY — LEFT HEART CATH AND CORONARY ANGIOGRAPHY
Anesthesia: LOCAL

## 2022-04-29 MED ORDER — SODIUM CHLORIDE 0.9 % IV SOLN: 250.0000 mL | INTRAVENOUS | Status: DC | PRN

## 2022-04-29 MED ORDER — HEPARIN (PORCINE) 25000 UT/250ML-% IV SOLN
1200.0000 [IU]/h | INTRAVENOUS | Status: DC
  Administered 2022-04-29: 1200 [IU]/h via INTRAVENOUS
  Filled 2022-04-29: qty 250

## 2022-04-29 MED ORDER — SODIUM CHLORIDE 0.9% FLUSH
3.0000 mL | Freq: Two times a day (BID) | INTRAVENOUS | Status: DC
  Administered 2022-04-29 – 2022-04-30 (×2): 3 mL via INTRAVENOUS

## 2022-04-29 MED ORDER — MIDAZOLAM HCL 2 MG/2ML IJ SOLN
INTRAMUSCULAR | Status: DC | PRN
  Administered 2022-04-29: 1 mg via INTRAVENOUS

## 2022-04-29 MED ORDER — SODIUM CHLORIDE 0.9 % IV SOLN: INTRAVENOUS | Status: DC

## 2022-04-29 MED ORDER — SODIUM CHLORIDE 0.9% FLUSH: 3.0000 mL | INTRAVENOUS | Status: DC | PRN

## 2022-04-29 MED ORDER — ACETAMINOPHEN 325 MG PO TABS: 650.0000 mg | ORAL_TABLET | ORAL | Status: DC | PRN

## 2022-04-29 MED ORDER — IOHEXOL 350 MG/ML SOLN
INTRAVENOUS | Status: DC | PRN
  Administered 2022-04-29: 60 mL

## 2022-04-29 MED ORDER — WARFARIN SODIUM 5 MG PO TABS
5.0000 mg | ORAL_TABLET | Freq: Once | ORAL | Status: AC
  Administered 2022-04-29: 5 mg via ORAL
  Filled 2022-04-29: qty 1

## 2022-04-29 MED ORDER — LIDOCAINE HCL (PF) 1 % IJ SOLN
INTRAMUSCULAR | Status: DC | PRN
  Administered 2022-04-29: 2 mL via INTRADERMAL
  Administered 2022-04-29: 10 mL via INTRADERMAL

## 2022-04-29 MED ORDER — HYDRALAZINE HCL 20 MG/ML IJ SOLN: 10.0000 mg | INTRAMUSCULAR | Status: AC | PRN

## 2022-04-29 MED ORDER — LIDOCAINE HCL (PF) 1 % IJ SOLN
INTRAMUSCULAR | Status: AC
  Filled 2022-04-29: qty 30

## 2022-04-29 MED ORDER — WARFARIN - PHARMACIST DOSING INPATIENT: Freq: Every day | Status: DC

## 2022-04-29 MED ORDER — LABETALOL HCL 5 MG/ML IV SOLN: 10.0000 mg | INTRAVENOUS | Status: AC | PRN

## 2022-04-29 MED ORDER — MIDAZOLAM HCL 2 MG/2ML IJ SOLN
INTRAMUSCULAR | Status: AC
  Filled 2022-04-29: qty 2

## 2022-04-29 MED ORDER — ONDANSETRON HCL 4 MG/2ML IJ SOLN: 4.0000 mg | Freq: Four times a day (QID) | INTRAMUSCULAR | Status: DC | PRN

## 2022-04-29 MED ORDER — FENTANYL CITRATE (PF) 100 MCG/2ML IJ SOLN
INTRAMUSCULAR | Status: AC
  Filled 2022-04-29: qty 2

## 2022-04-29 MED ORDER — FENTANYL CITRATE (PF) 100 MCG/2ML IJ SOLN
INTRAMUSCULAR | Status: DC | PRN
  Administered 2022-04-29: 25 ug via INTRAVENOUS

## 2022-04-29 MED ORDER — HEPARIN SODIUM (PORCINE) 1000 UNIT/ML IJ SOLN
INTRAMUSCULAR | Status: AC
  Filled 2022-04-29: qty 10

## 2022-04-29 MED ORDER — HEPARIN (PORCINE) IN NACL 1000-0.9 UT/500ML-% IV SOLN
INTRAVENOUS | Status: DC | PRN
  Administered 2022-04-29 (×2): 500 mL

## 2022-04-29 MED ORDER — VERAPAMIL HCL 2.5 MG/ML IV SOLN
INTRAVENOUS | Status: AC
  Filled 2022-04-29: qty 2

## 2022-04-29 SURGICAL SUPPLY — 13 items
CATH INFINITI 5FR MULTPACK ANG (CATHETERS) IMPLANT
GLIDESHEATH SLEND SS 6F .021 (SHEATH) IMPLANT
GUIDEWIRE INQWIRE 1.5J.035X260 (WIRE) IMPLANT
INQWIRE 1.5J .035X260CM (WIRE)
KIT HEART LEFT (KITS) ×2 IMPLANT
PACK CARDIAC CATHETERIZATION (CUSTOM PROCEDURE TRAY) ×2 IMPLANT
SHEATH PINNACLE 5F 10CM (SHEATH) IMPLANT
SYR MEDRAD MARK 7 150ML (SYRINGE) IMPLANT
TRANSDUCER W/STOPCOCK (MISCELLANEOUS) ×2 IMPLANT
TUBING CIL FLEX 10 FLL-RA (TUBING) ×2 IMPLANT
TUBING CONTRAST HIGH PRESS 48 (TUBING) IMPLANT
WIRE EMERALD 3MM-J .035X150CM (WIRE) IMPLANT
WIRE MICRO SET SILHO 5FR 7 (SHEATH) IMPLANT

## 2022-04-29 NOTE — Progress Notes (Signed)
Heart Failure Nurse Navigator Progress Note  PCP: [name], MD PCP-Cardiologist: Tenny Craw Admission Diagnosis: Stroke like symptoms Admitted from: Home via EMS  Presentation:   Hannah [name] presented with increased difficulty with ambulation, especially her right leg. Reported some numbness, and couldn't step up her stairs. BP 187/ 91, HR 99, CT head negative for stroke, Troponin elevated, with cardiology consulted, Echo with EF 30%, plan for heart cath ? Takotsubo cardiomyopathy vs LAD disease.  Patient and family were educated on the sign and symptoms of heart failure, daily weights, when to call her doctor or go to the ED, Diet/ fluid restrictions, patient reported to drinking about 2 bottles of Pepsi per day and hasn't been watching her sodium intake. Patient lives with Lucila Maine and doesn't always cook . Her Lucila Maine  does the grocery shopping and will bring her food. Patient educated on the importance of taking all her medication as prescribed and attending all medical appointments. Patient and family verbalized their understanding and are waiting for her scheduled 2 pm heart cath on 4/26. Patient scheduled for a HF TOC appointment on 05/16/2022 @ 3 pm.   ECHO/ LVEF: 30%  Clinical Course:  Past Medical History:  Diagnosis Date   Acute massive pulmonary embolism (HCC) 07/13/2012   Massive PE w/ PEA arrest 07/13/12 >TNK >IVC filter >discharged on comadin     Anticoagulated on warfarin    Chronic diastolic CHF (congestive heart failure) (HCC) 03/15/2017   Chronic kidney disease, stage 3a (HCC) 12/15/2018   Diabetes mellitus, type 2 (HCC) 07/15/2012   with peripheral neuropathy   DM2 (diabetes mellitus, type 2) (HCC) 07/23/2012   HFrEF (heart failure with reduced ejection fraction) (HCC) 03/15/2017   Non-ischemic cardiomyopathy // Cardiac catheterization 01/01/21:  RCA prox to mid 20 // Echocardiogram 12/26/20:  EF 30-35, ant septum/apex, mid inf-sept AK; ant-lat/ant/inf HK, Gr 1 DD, mild  LVH, normal RVSF, RVSP 27.2, small effusion (ant to RV), mild MR, mod TR, AV sclerosis w/o AS   Hyperlipemia 07/14/2012   Hyperlipidemia 02/25/2013   Hypertension 07/14/2012   Hypothyroid 06/06/2013   Large bowel stricture (HCC)    s/p colectomy in 2016   Neuropathy 02/12/2015   Osteoarthritis    s/p hip and knee replacements   PAF (paroxysmal atrial fibrillation) (HCC)    Pulmonary hypertension (HCC) 07/26/2016   Syncope 10/2018   Thyroid disease      Social History   Socioeconomic History   Marital status: Widowed    Spouse name: Not on file   Number of children: Not on file   Years of education: Not on file   Highest education level: Not on file  Occupational History   Not on file  Tobacco Use   Smoking status: Former    Packs/day: 1.00    Years: 10.00    Additional pack years: 0.00    Total pack years: 10.00    Types: Cigarettes    Quit date: 01/04/1968    Years since quitting: 54.3   Smokeless tobacco: Never  Vaping Use   Vaping Use: Never used  Substance and Sexual Activity   Alcohol use: No   Drug use: No   Sexual activity: Not on file  Other Topics Concern   Not on file  Social History Narrative   Lives alone.          Social Determinants of Health   Financial Resource Strain: Low Risk  (12/13/2021)   Overall Financial Resource Strain (CARDIA)    Difficulty of Paying  Living Expenses: Not very hard  Food Insecurity: No Food Insecurity (04/25/2022)   Hunger Vital Sign    Worried About Running Out of Food in the Last Year: Never true    Ran Out of Food in the Last Year: Never true  Transportation Needs: No Transportation Needs (04/25/2022)   PRAPARE - Administrator, Civil Service (Medical): No    Lack of Transportation (Non-Medical): No  Physical Activity: Insufficiently Active (06/24/2021)   Exercise Vital Sign    Days of Exercise per Week: 7 days    Minutes of Exercise per Session: 20 min  Stress: No Stress Concern Present (06/24/2021)   [employer] of Occupational Health - Occupational Stress Questionnaire    Feeling of Stress : Not at all  Social Connections: Moderately Integrated (06/24/2021)   Social Connection and Isolation Panel [NHANES]    Frequency of Communication with Friends and Family: More than three times a week    Frequency of Social Gatherings with Friends and Family: More than three times a week    Attends Religious Services: More than 4 times per year    Active Member of [employer] or Organizations: Yes    Attends Banker Meetings: More than 4 times per year    Marital Status: Widowed   Education Assessment and Provision:  Detailed education and instructions provided on heart failure disease management including the following:  Signs and symptoms of Heart Failure When to call the physician Importance of daily weights Low sodium diet Fluid restriction Medication management Anticipated future follow-up appointments  Patient education given on each of the above topics.  Patient acknowledges understanding via teach back method and acceptance of all instructions.  Education Materials:  "Living Better With Heart Failure" Booklet, HF zone tool, & Daily Weight Tracker Tool.  Patient has scale at home: yes Patient has pill box at home: yes     High Risk Criteria for Readmission and/or Poor Patient Outcomes: Heart failure hospital admissions (last 6 months): 0  No Show rate: 1 % Difficult social situation: No, lives with Lucila Maine Demonstrates medication adherence: Yes Primary Language: English Literacy level: Reading, writing, and comprehension  Barriers of Care:   Diet/ fluid/ daily weights ( salt/ soda) HF continued Education  Considerations/Referrals:   Referral made to Heart Failure Pharmacist Stewardship: Yes Referral made to Heart Failure CSW/NCM TOC: No, lives with Grandson Referral made to Heart & Vascular TOC clinic: Yes, 05/16/2022 @ 3 pm  Items for Follow-up on DC/TOC: Diet/  fluid / daily weights ( salt/ pepsi) Continued HF education   Rhae Hammock, BSN, RN Heart Failure Print production planner Chat Only

## 2022-04-29 NOTE — Progress Notes (Addendum)
ANTICOAGULATION CONSULT NOTE  Pharmacy Consult to restart Warfarin  Indication: pulmonary embolus  Allergies  Allergen Reactions   Augmentin [Amoxicillin-Pot Clavulanate] Itching and Other (See Comments)    Severe vaginal itching   Tranxene [Clorazepate] Itching   Crestor [Rosuvastatin]     Made her sick on stomach, she is able to tolerate the zocor   Penicillins Itching and Rash    Patient Measurements: Height: 5\' 7"  (170.2 cm) Weight: 89.1 kg (196 lb 8 oz) IBW/kg (Calculated) : 61.6 Heparin Dosing Weight:   Vital Signs: Temp: 98.7 F (37.1 C) (04/26 1407) Temp Source: Oral (04/26 1407) BP: 160/80 (04/26 1828) Pulse Rate: 76 (04/26 1828)  Labs: Recent Labs    04/27/22 0224 04/28/22 0316 04/29/22 0302  HGB 13.7  --  14.0  HCT 43.7  --  44.2  PLT 233  --  227  LABPROT 30.9* 25.1* 20.2*  INR 3.0* 2.3* 1.7*  CREATININE 1.53* 1.33* 1.30*     Estimated Creatinine Clearance: 39.6 mL/min (A) (by C-G formula based on SCr of 1.3 mg/dL (H)).   Medical History: Past Medical History:  Diagnosis Date   Acute massive pulmonary embolism (HCC) 07/13/2012   Massive PE w/ PEA arrest 07/13/12 >TNK >IVC filter >discharged on comadin     Anticoagulated on warfarin    Chronic diastolic CHF (congestive heart failure) (HCC) 03/15/2017   Chronic kidney disease, stage 3a (HCC) 12/15/2018   Diabetes mellitus, type 2 (HCC) 07/15/2012   with peripheral neuropathy   DM2 (diabetes mellitus, type 2) (HCC) 07/23/2012   HFrEF (heart failure with reduced ejection fraction) (HCC) 03/15/2017   Non-ischemic cardiomyopathy // Cardiac catheterization 01/01/21:  RCA prox to mid 20 // Echocardiogram 12/26/20:  EF 30-35, ant septum/apex, mid inf-sept AK; ant-lat/ant/inf HK, Gr 1 DD, mild LVH, normal RVSF, RVSP 27.2, small effusion (ant to RV), mild MR, mod TR, AV sclerosis w/o AS   Hyperlipemia 07/14/2012   Hyperlipidemia 02/25/2013   Hypertension 07/14/2012   Hypothyroid 06/06/2013   Large bowel stricture  (HCC)    s/p colectomy in 2016   Neuropathy 02/12/2015   Osteoarthritis    s/p hip and knee replacements   PAF (paroxysmal atrial fibrillation) (HCC)    Pulmonary hypertension (HCC) 07/26/2016   Syncope 10/2018   Thyroid disease      Assessment: 81 yoF with hx AF and PE on warfarin PTA (INR goal 2.5-3.5 given hx PE with therapeutic INR) admitted with RLE weakness. Pt also with CP, warfarin held with plans for cardiac cath once INR trends down.  INR today is 1.7, will begin heparin drip.  UPDATE post Cath 6:29 PM  S/p post cath today,  pharmacy cosnulted to  restart W[place]arin tonight withoul bridge. Cath showed minimal CAD, recovering Takotsubo cardiomyopathy. Cardiology to arrange f/u appt for INR next week.   PTA warfarin last taken 04/23/22 PM.  Goal INR is 2.5-3.5.   Her prior to admission Warfarin dose was 5mg  every Wed/Fri, 2.5mg  all other days of the week  for hx PE.  INR 3.0 on 4/21 admit date>>> INR 1.7 today 04/29/22.   CBC wnl, stable.    Goal of Therapy:  INR 2.5-3.5 (per anti-coag visit note)    Plan:  Discontinue IV heparin Give Warfarin 5mg  x1 today  Daily INR  - It looks like plan for discharge tonight. If discharged 4/26,   I recommend Warfarin 5mg  daily with INR check Monday.   If INR check will be later than Monday 4/29, I would recommend reduce  warfarin to 2.5mg  on Monday 4/29, resuming her usual PTA dose  2.5 mg daily except 5mg  on Wed/Fridays.  Monitor for bleeding and contact Cardiologist if bleeding occurs.  Thank you for allowing pharmacy to be part of this patients care team. Noah Delaine, RPh Clinical Pharmacist  Please check AMION for all [facility] Pharmacy numbers 04/29/2022 6:35 PM

## 2022-04-29 NOTE — Progress Notes (Signed)
PROGRESS NOTE    Hannah [name]  UEA:[id number] DOB: [birth year] DOA: 04/24/2022  PCP: [name], MD   Brief Narrative:  This 82 yrs old female with medical history significant of pulmonary hypertension, CAD, hypertension, paroxysmal atrial fibrillation, PE on Coumadin, type 2 diabetes, CKD 3A, hypothyroidism and hyperlipidemia who presents with right lower extremity weakness. Patient believes symptoms happened around 8pm. She was brought in the ED as a stroke code, feels mid-chest tightness since being in ED but thinks due to anxiety. Also has notable right UE intention tremor she reports started 2 days ago. Neurology  and Cardiology is  consulted.  She is admitted for further evaluation.   Assessment & Plan:   Principal Problem:   Right sided weakness Active Problems:   DM2 (diabetes mellitus, type 2) (HCC)   Hyperlipidemia   Hypothyroid   History of pulmonary embolism   Anticoagulated on warfarin   Chronic kidney disease, stage 3a (HCC)   CAD (coronary artery disease)   PAF (paroxysmal atrial fibrillation) (HCC)   HTN (hypertension)   Chronic systolic CHF (congestive heart failure) (HCC)   S/P colostomy (HCC)   NSTEMI (non-ST elevated myocardial infarction) (HCC)   Right sided weakness: - She presented with RUE weakness on exam with right facial drop. Concerning for stroke.  - MRI brain without acute infarct noted - CTA head and neck unremarkable, reviewed - 2d echo with EF 30%, wall motion abnormalities noted, no shunt seen.  - Continue atorvastatin - Cont anticoagulation with plan to transition to heparin gtt when INR<2 then resumption of coumadin once OK per Cardiology - Hb A1c 7.2, LDL 56 undergoal - PT/OT/SLT -Discussed case with Neurology on call. Stroke w/u essentially complete.  -Rec to continue with therapeutic anticoagulation per above and f/u with Neurology in 4-6 weeks after discharge.   NSTEMI: -LHC in 2022 with non-obstructive disease of LAD OR  CIRCUMFLEX. Mild plaque to large, dominant RCA.  -initial trop 1600 on 4/22 with pt complaining of "indigestion." Trop did trend up to over 2200 -Chest discomfort eventually improved after NTG x 3 -Consulted Cardiology, appreciate recs. -2d echo noted per above. Given echo results, Cardio plans for heart cath once INR improves. -Left heart cath scheduled today since INR in 1.7   S/P colostomy -secondary to hx of large bowel stricture, s/p colectomy . -daily colostomy care   Chronic systolic CHF (congestive heart failure) -Per cardiology, giving low dose metoprolol per Cardiology. No room for entresto, spironolactone -Consider farxiga prior to d/c -seems euvolemic at this time.   Essential hypertension: Continue metoprolol 12.5 mg twice daily.   PAF (paroxysmal atrial fibrillation): -Had been on warfarin PTA -Per Cardiology, hold coumadin, transition to heparin gtt once INR<2   Chronic kidney disease, stage 3a (HCC): -Creatinine stable at baseline. -Avoid nephrotoxic medications.   History of pulmonary embolism: -On warfarin with goal of 2.5-3.5 PTA -Neurology recommends continuing anticoagulation per above -Per Cardiology, rec to hold coumadin and transition to heparin gtt once INR<2   Hypothyroidism: -Continue levothyroxine   Hyperlipidemia: -Continue statin   DM2 (diabetes mellitus, type 2): -no hyperglycemia -remote A1C 08/2021 was controlled 6.7, repeat A1c 7.2   DVT prophylaxis: Heparin IV Code Status: Full code Family Communication: No family at bed side. Disposition Plan:  Status is: Inpatient Remains inpatient appropriate because: Admitted for stroke workup which is unremarkable.   Patient remains on anticoagulation plan is to have left heart cath  today as  INR below 2.   Consultants:  Cardiology Neurology  Procedures: CT head CTA head and neck Antimicrobials:  Anti-infectives (From admission, onward)    None      Subjective: Patient was seen  and examined at bedside.  Overnight events noted. Today patient is going to have left heart cath since INR is 1.7. Patient  still reports having epigastric burning.  Objective: Vitals:   04/28/22 1631 04/28/22 2014 04/29/22 0354 04/29/22 0816  BP: 116/72 115/71 123/75 114/69  Pulse: 75 72 (!) 57 63  Resp: 18 16 18    Temp: 97.6 F (36.4 C) 97.8 F (36.6 C) 97.6 F (36.4 C)   TempSrc: Oral Oral Oral   SpO2: 97% 95% 96% 95%  Weight:   89.1 kg   Height:        Intake/Output Summary (Last 24 hours) at 04/29/2022 1349 Last data filed at 04/29/2022 0354 Gross per 24 hour  Intake 120 ml  Output 500 ml  Net -380 ml   Filed Weights   04/25/22 0249 04/25/22 1800 04/29/22 0354  Weight: 88.7 kg 93.8 kg 89.1 kg    Examination:  General exam: Appears comfortable, deconditioned, NAD Respiratory system: Clear to auscultation. Respiratory effort normal.  RR 14 Cardiovascular system: S1 & S2 heard, RRR. No JVD, murmurs, rubs, gallops or clicks.  Gastrointestinal system: Abdomen is soft, non tender, non distended, BS+ Central nervous system: Alert and oriented X 3. No focal neurological deficits. Extremities: Symmetric 5 x 5 power. Skin: No rashes, lesions or ulcers Psychiatry: Judgement and insight appear normal. Mood & affect appropriate.     Data Reviewed: I have personally reviewed following labs and imaging studies  CBC: Recent Labs  Lab 04/24/22 2321 04/24/22 2336 04/26/22 0501 04/27/22 0224 04/29/22 0302  WBC 11.1*  --  8.9 8.4 8.5  NEUTROABS 8.0*  --   --   --   --   HGB 15.5* 17.7* 13.7 13.7 14.0  HCT 50.4* 52.0* 44.3 43.7 44.2  MCV 85.7  --  86.0 84.7 85.0  PLT 261  --  224 233 227   Basic Metabolic Panel: Recent Labs  Lab 04/24/22 2321 04/24/22 2336 04/26/22 0501 04/27/22 0224 04/28/22 0316 04/29/22 0302  NA 139 139 137 135 137 135  K 4.1 4.4 3.7 3.6 3.7 4.5  CL 106 109 103 103 104 105  CO2 19*  --  23 21* 22 20*  GLUCOSE 122* 122* 115* 116* 97 99  BUN  26* 35* 33* 34* 34* 30*  CREATININE 1.33* 1.30* 1.58* 1.53* 1.33* 1.30*  CALCIUM 9.0  --  8.4* 8.5* 8.5* 8.6*  MG  --   --   --   --  2.0 2.1  PHOS  --   --   --   --  3.3 3.1   GFR: Estimated Creatinine Clearance: 39.6 mL/min (A) (by C-G formula based on SCr of 1.3 mg/dL (H)). Liver Function Tests: Recent Labs  Lab 04/24/22 2321 04/26/22 0501 04/27/22 0224  AST 22 20 18   ALT 14 11 12   ALKPHOS 69 51 53  BILITOT 1.0 1.1 1.1  PROT 7.7 6.0* 5.9*  ALBUMIN 3.8 3.0* 2.8*   No results for input(s): "LIPASE", "AMYLASE" in the last 168 hours. No results for input(s): "AMMONIA" in the last 168 hours. Coagulation Profile: Recent Labs  Lab 04/24/22 2321 04/26/22 0501 04/27/22 0224 04/28/22 0316 04/29/22 0302  INR 3.0* 3.8* 3.0* 2.3* 1.7*   Cardiac Enzymes: No results for input(s): "CKTOTAL", "CKMB", "CKMBINDEX", "TROPONINI" in the last 168 hours. BNP (last 3 results) No  results for input(s): "PROBNP" in the last 8760 hours. HbA1C: No results for input(s): "HGBA1C" in the last 72 hours.  CBG: No results for input(s): "GLUCAP" in the last 168 hours. Lipid Profile: No results for input(s): "CHOL", "HDL", "LDLCALC", "TRIG", "CHOLHDL", "LDLDIRECT" in the last 72 hours.  Thyroid Function Tests: No results for input(s): "TSH", "T4TOTAL", "FREET4", "T3FREE", "THYROIDAB" in the last 72 hours. Anemia Panel: No results for input(s): "VITAMINB12", "FOLATE", "FERRITIN", "TIBC", "IRON", "RETICCTPCT" in the last 72 hours. Sepsis Labs: No results for input(s): "PROCALCITON", "LATICACIDVEN" in the last 168 hours.  No results found for this or any previous visit (from the past 240 hour(s)).   Radiology Studies: No results found.  Scheduled Meds:  aspirin EC  81 mg Oral Daily   atorvastatin  40 mg Oral Daily   levothyroxine  25 mcg Oral Q0600   metoprolol tartrate  12.5 mg Oral BID   sodium chloride flush  3 mL Intravenous Q12H   Continuous Infusions:  sodium chloride     sodium  chloride 10 mL/hr at 04/29/22 0504   heparin 1,200 Units/hr (04/29/22 0815)     LOS: 4 days    Time spent: 35 mins    Willeen Niece, MD [facility]   If 7PM-7AM, please contact night-coverage

## 2022-04-29 NOTE — Interval H&P Note (Signed)
History and Physical Interval Note:  04/29/2022 7:16 AM  Hannah [name]  has presented today for surgery, with the diagnosis of nstemi.  The various methods of treatment have been discussed with the patient and family. After consideration of risks, benefits and other options for treatment, the patient has consented to  Procedure(s): LEFT HEART CATH AND CORONARY ANGIOGRAPHY (N/A) as a surgical intervention.  The patient's history has been reviewed, patient examined, no change in status, stable for surgery.  I have reviewed the patient's chart and labs.  Questions were answered to the patient's satisfaction.     Orbie Pyo

## 2022-04-29 NOTE — Progress Notes (Signed)
Old purewick removed, peri care provided, new purewick placed, linens changed, safety maintained, tolerated well

## 2022-04-29 NOTE — Progress Notes (Signed)
Rounding Note    Patient Name: Hannah [name] Date of Encounter: 04/29/2022  [facility] Cardiologist: Dietrich Pates, MD   Subjective   No acute events overnight. No new concerns. Planned for cath today, INR 1.7. She has no additional questions, reviewed plan today.  Inpatient Medications    Scheduled Meds:  aspirin EC  81 mg Oral Daily   atorvastatin  40 mg Oral Daily   levothyroxine  25 mcg Oral Q0600   metoprolol tartrate  12.5 mg Oral BID   sodium chloride flush  3 mL Intravenous Q12H   Continuous Infusions:  sodium chloride     sodium chloride 10 mL/hr at 04/29/22 0504   heparin 1,200 Units/hr (04/29/22 0815)   PRN Meds: sodium chloride, acetaminophen **OR** acetaminophen (TYLENOL) oral liquid 160 mg/5 mL **OR** acetaminophen, morphine injection, nitroGLYCERIN, sodium chloride flush   Vital Signs    Vitals:   04/28/22 1631 04/28/22 2014 04/29/22 0354 04/29/22 0816  BP: 116/72 115/71 123/75 114/69  Pulse: 75 72 (!) 57 63  Resp: 18 16 18    Temp: 97.6 F (36.4 C) 97.8 F (36.6 C) 97.6 F (36.4 C)   TempSrc: Oral Oral Oral   SpO2: 97% 95% 96%   Weight:   89.1 kg   Height:        Intake/Output Summary (Last 24 hours) at 04/29/2022 0908 Last data filed at 04/29/2022 0354 Gross per 24 hour  Intake 300 ml  Output 950 ml  Net -650 ml      04/29/2022    3:54 AM 04/25/2022    6:00 PM 04/25/2022    2:49 AM  Last 3 Weights  Weight (lbs) 196 lb 8 oz 206 lb 12.7 oz 195 lb 9.6 oz  Weight (kg) 89.132 kg 93.8 kg 88.724 kg      Telemetry    SR - Personally Reviewed  ECG    No new since 4/22 - Personally Reviewed  Physical Exam   GEN: Well nourished, well developed in no acute distress NECK: No JVD CARDIAC: regular rhythm, normal S1 and S2, no rubs or gallops. No murmur. VASCULAR: Radial pulses 2+ bilaterally.  RESPIRATORY:  Clear to auscultation without rales, wheezing or rhonchi  ABDOMEN: Soft, non-tender, non-distended MUSCULOSKELETAL:  Moves all  4 limbs independently SKIN: Warm and dry, no edema NEUROLOGIC:  Mild R facial droop PSYCHIATRIC:  Normal affect    Labs    High Sensitivity Troponin:   Recent Labs  Lab 04/25/22 0549 04/25/22 0840 04/25/22 1024 04/25/22 1216  TROPONINIHS 1,645* 1,869* 1,919* 2,278*     Chemistry Recent Labs  Lab 04/24/22 2321 04/24/22 2336 04/26/22 0501 04/27/22 0224 04/28/22 0316 04/29/22 0302  NA 139   < > 137 135 137 135  K 4.1   < > 3.7 3.6 3.7 4.5  CL 106   < > 103 103 104 105  CO2 19*  --  23 21* 22 20*  GLUCOSE 122*   < > 115* 116* 97 99  BUN 26*   < > 33* 34* 34* 30*  CREATININE 1.33*   < > 1.58* 1.53* 1.33* 1.30*  CALCIUM 9.0  --  8.4* 8.5* 8.5* 8.6*  MG  --   --   --   --  2.0 2.1  PROT 7.7  --  6.0* 5.9*  --   --   ALBUMIN 3.8  --  3.0* 2.8*  --   --   AST 22  --  20 18  --   --  ALT 14  --  11 12  --   --   ALKPHOS 69  --  51 53  --   --   BILITOT 1.0  --  1.1 1.1  --   --   GFRNONAA 40*  --  33* 34* 40* 42*  ANIONGAP 14  --  11 11 11 10    < > = values in this interval not displayed.    Lipids  Recent Labs  Lab 04/25/22 0549  CHOL 118  TRIG 51  HDL 49  LDLCALC 59  CHOLHDL 2.4    Hematology Recent Labs  Lab 04/26/22 0501 04/27/22 0224 04/29/22 0302  WBC 8.9 8.4 8.5  RBC 5.15* 5.16* 5.20*  HGB 13.7 13.7 14.0  HCT 44.3 43.7 44.2  MCV 86.0 84.7 85.0  MCH 26.6 26.6 26.9  MCHC 30.9 31.4 31.7  RDW 19.1* 18.8* 19.2*  PLT 224 233 227   Thyroid No results for input(s): "TSH", "FREET4" in the last 168 hours.  BNPNo results for input(s): "BNP", "PROBNP" in the last 168 hours.  DDimer No results for input(s): "DDIMER" in the last 168 hours.   Radiology    No results found.  Cardiac Studies   Cardiac Studies & Procedures   CARDIAC CATHETERIZATION  CARDIAC CATHETERIZATION 01/01/2021  Narrative   Prox RCA to Mid RCA lesion is 20% stenosed.  Mild plaque in the mid segment of the large, dominant RCA No obstructive disease in the LAD or  Circumflex Normal LV filling pressure.  Recommendations: Her cardiomyopathy is non-ischemic. Consider her uncontrolled HTN as a cause of her cardiomyopathy (SBP 220 mmHg on arrival to the cath lab earlier this week and SBP of 195 on arrival to the cath lab today). Titrate beta blocker. Add additional medical therapy for better BP control.  Findings Coronary Findings Diagnostic  Dominance: Right  Left Anterior Descending Vessel is large.  Left Circumflex Vessel is large.  Right Coronary Artery Vessel is large. Prox RCA to Mid RCA lesion is 20% stenosed.  Intervention  No interventions have been documented.   CARDIAC CATHETERIZATION 12/30/2020  Narrative Normal right heart pressures Unsuccessful attempt at left heart cath from the right radial artery due to radial artery loop.  Recommendations: Will plan to bring her back for left heart catheterization from the right femoral artery approach tomorrow if her INR is less than 1.7. Given her elevated INR today, I did not feel that it was safe to proceed with femoral artery access today. Her right radial artery should not be used for access for future cardiac caths. NPO at midnight. Will not resume IV heparin tonight since we did obtain access into the right femoral vein today.   STRESS TESTS  NM MYOCAR MULTI W/SPECT W 12/16/2018  Narrative  There was no ST segment deviation noted during stress.  The study is normal.  This is a low risk study.  The left ventricular ejection fraction is normal (55-65%).  Nuclear stress EF: 57%.  No T wave inversion was noted during stress.  1. Is a normal myocardial perfusion imaging study without evidence of ischemia or infarction. 2.  Significant diaphragm attenuation noted. 3.  Normal left-ventricular ejection fraction without wall motion normality.  Gerri Spore T. Flora Lipps, MD [facility] [address] [place], [place] [address] [phone] 4:27 PM    ECHOCARDIOGRAM  ECHOCARDIOGRAM COMPLETE 04/25/2022  Narrative ECHOCARDIOGRAM REPORT    Patient Name:   Hannah [name] Date of Exam: 04/25/2022 Medical Rec #:  [id number]    Height:       67.0 in Accession #:    [id number]   Weight:       195.6 lb Date of Birth:  1940-01-25    BSA:          2.003 m Patient Age:    81 years     BP:           126/75 mmHg Patient Gender: F            HR:           102 bpm. Exam Location:  Inpatient  Procedure: 2D Echo, Color Doppler, Cardiac Doppler and Intracardiac Opacification Agent  REPORT CONTAINS CRITICAL RESULT  Indications:    Stroke  History:        Patient has prior history of Echocardiogram examinations, most recent 05/24/2021. HFrEF and CHF, Pulmonary HTN, Arrythmias:Atrial Fibrillation, Signs/Symptoms:Syncope; Risk Factors:Hypertension, Dyslipidemia and Diabetes. CKD.  Sonographer:    Milda Smart Referring Phys: [id number] CHING T TU   Sonographer Comments: Technically difficult study due to poor echo windows. Image acquisition challenging due to patient body habitus and Image acquisition challenging due to respiratory motion. IMPRESSIONS   1. Left ventricular ejection fraction, by estimation, is 30%. The left ventricle has moderately decreased function. The left ventricle demonstrates regional wall motion abnormalities with hyperkinetic basal LV segments and akinesis of the mid to apical anteroseptal, mid-apical inferoseptal, apical lateral, apical anterior, and mid-apical inferior walls as well as the true apex. With hyperdynamic basal LV contraction, there is mitral valve systolic anterior motion with LV outflow tract gradient, peak 45 mmHg. These findings are consistent with large wrap-around LAD territory MI or a stress (Takotsubo-type) cardiomyopathy. There is mild left ventricular hypertrophy. Left ventricular diastolic parameters are consistent with Grade I diastolic dysfunction (impaired relaxation). 2. Right ventricular  systolic function is normal. The right ventricular size is normal. There is normal pulmonary artery systolic pressure. The estimated right ventricular systolic pressure is 30.2 mmHg. 3. There was systolic anterior motion of the anterior mitral leaflet. The mitral valve is abnormal. Mild to moderate mitral valve regurgitation. No evidence of mitral stenosis. 4. The aortic valve is tricuspid. There is mild calcification of the aortic valve. Aortic valve regurgitation is not visualized. No aortic stenosis is present. 5. The inferior vena cava is normal in size with greater than 50% respiratory variability, suggesting right atrial pressure of 3 mmHg.  FINDINGS Left Ventricle: Left ventricular ejection fraction, by estimation, is 30%. The left ventricle has moderately decreased function. The left ventricle demonstrates regional wall motion abnormalities. Definity contrast agent was given IV to delineate the left ventricular endocardial borders. The left ventricular internal cavity size was normal in size. There is mild left ventricular hypertrophy. Left ventricular diastolic parameters are consistent with Grade I diastolic dysfunction (impaired relaxation).  Right Ventricle: The right ventricular size is normal. No increase in right ventricular wall thickness. Right ventricular systolic function is normal. There is normal pulmonary artery systolic pressure. The tricuspid regurgitant velocity is 2.61 m/s, and with an assumed right atrial pressure of 3 mmHg, the estimated right ventricular systolic pressure is 30.2 mmHg.  Left Atrium: Left atrial size was normal in size.  Right Atrium: Right atrial size was normal in size.  Pericardium: There is no evidence of pericardial effusion.  Mitral Valve: There was systolic anterior motion of the anterior mitral leaflet. The mitral valve is abnormal. Mild to moderate mitral annular calcification. Mild to moderate mitral valve regurgitation. No  evidence of mitral  valve stenosis.  Tricuspid Valve: The tricuspid valve is normal in structure. Tricuspid valve regurgitation is trivial.  Aortic Valve: The aortic valve is tricuspid. There is mild calcification of the aortic valve. Aortic valve regurgitation is not visualized. No aortic stenosis is present.  Pulmonic Valve: The pulmonic valve was normal in structure. Pulmonic valve regurgitation is not visualized.  Aorta: The aortic root is normal in size and structure.  Venous: The inferior vena cava is normal in size with greater than 50% respiratory variability, suggesting right atrial pressure of 3 mmHg.  IAS/Shunts: No atrial level shunt detected by color flow Doppler.   LEFT VENTRICLE PLAX 2D LVIDd:         3.00 cm LVIDs:         2.10 cm LV PW:         1.20 cm LV IVS:        1.20 cm LVOT diam:     2.00 cm LVOT Area:     3.14 cm   LEFT ATRIUM             Index        RIGHT ATRIUM          Index LA diam:        3.10 cm 1.55 cm/m   RA Area:     8.57 cm LA Vol (A2C):   30.6 ml 15.28 ml/m  RA Volume:   13.30 ml 6.64 ml/m LA Vol (A4C):   16.8 ml 8.39 ml/m LA Biplane Vol: 23.6 ml 11.78 ml/m  AORTA Ao Root diam: 2.80 cm Ao Asc diam:  2.90 cm  TRICUSPID VALVE TR Peak grad:   27.2 mmHg TR Mean grad:   15.0 mmHg TR Vmax:        261.00 cm/s TR Vmean:       189.0 cm/s  SHUNTS Systemic Diam: 2.00 cm  Dalton McleanMD Electronically signed by Wilfred Lacy Signature Date/Time: 04/25/2022/4:12:17 PM    Final              Patient Profile     82 y.o. female with a hx of massive PE (lifelong anticoagulation), prior nonischemic cardiomyopathy (recovered EF) with non-ischemic cardiomyopathy, minimal nonobstructive CAD, paroxysmal afib, hypertension, hyperlipidemia, who is being followed for the evaluation of elevated troponin. Ms. Zahniser presented to [facility] ED on 4/21 with EMS for evaluation of symptoms of right lower extremity weakness, last normal around 8pm on 4/21. Patient found  that she was unable to lift her right leg and also had associated numbness. Patient denied other neurological abnormalities. She did report some generalized chest discomfort. Initial workup noted hypertension with BP 180s/90s. Given leg weakness, a code stroke was called and patient was seen by teleneurology. CT head negative for acute intracranial abnormality and neurology recommended admission for further stroke workup with brain MRI. Permissive hypertension also recommended. Initial labwork noted creatinine 1.33, WBC 11.1. Given chest discomfort, troponin also checked, found elevated at 1645->1869.   Assessment & Plan    Reduced EF Elevated troponins History of prior systolic dysfunction with recovery of EF -no chest pain. Pattern could be takotsubo vs. Distal large LAD or multivessel disease -previously cath in 2022 with minimal 20% RCA -long standing IVCD/LBBB on ECG.  -prior cardiomyopathy thought to be 2/2 hypertension at that time. -on metoprolol low dose with hold parameters -no room for home entresto, spironolactone given low blood pressures -consider restart farxiga prior to discharge -planned for cath today as INR  down to 1.7   Minimal nonobstructive CAD by cath -Continue atorvastatin, started low dose aspirin in the interim. If no obstructive CAD, this can be stopped once she is restarted on coumadin.   History of PE Paroxysmal atrial fibrillation -chadsvasc 6, on coumadin long term, holding with plans for cath. Would not reverse. -restart coumadin based on cath results. Discussed DOAC, she would prefer to stay with coumadin. Does not need to be bridged.  For questions or updates, please contact [facility] Please consult [email or web address] for contact info under     Signed, Jodelle Red, MD  04/29/2022, 9:09 AM

## 2022-04-29 NOTE — Progress Notes (Signed)
SITE AREA:  right  groin  SITE PRIOR TO REMOVAL:  LEVEL 0  PRESSURE APPLIED FOR: approximately 20 minutes  MANUAL: yes  PATIENT STATUS DURING PULL: stable  POST PULL SITE:  LEVEL 0  POST PULL INSTRUCTIONS GIVEN: yes, br x 4 hrs, only showering for next week, keep right groin area dry, drsg x24 hours, please inform staff if right groin feels warm or moist, no running, jumping etc for next week   POST PULL PULSES PRESENT: bilateral pedal pulses palpable at +1    DRESSING APPLIED: gauze with tegaderm  BEDREST BEGINS @ 1718  COMMENTS:

## 2022-04-29 NOTE — Progress Notes (Signed)
   Heart Failure Stewardship Pharmacist Progress Note   PCP: [name], MD PCP-Cardiologist: Dietrich Pates, MD    HPI:  82 yo F with PMH of CHF, pulmonary HTN, CAD, HTN, afib, PE, T2DM, CKD IIIa, hypothyroidism, and HLD.  Presented to the ED on 4/21 with mobility issues and R leg weakness. Code stroke was called in ED. Did not receive thrombolytics since LKW >4.5 hours. CT and MRI without acute intracranial abnormality. Troponin was elevated, cardiology consulted. Has a history with HFrEF with LVEF 30-35% in 12/2020 due to NICM. EF recovered to 60-65% in 05/2021. Transferred to [facility] on 4/23. ECHO this admission showed EF reduced back down to 30%, with regional wall motion abnormalities, mild-moderate MR, Takotsubo cardiomyopathy vs LAD disease on differential. Plan for cath on 4/26.  Current HF Medications: Beta Blocker: metoprolol tartrate 12.5 mg BID  Prior to admission HF Medications: Diuretic: furosemide 20 mg daily PRN ACE/ARB/ARNI: Entresto 24/26 mg BID MRA: spironolactone 12.5 mg daily SGLT2i: Farxiga 10 mg daily *was not taking metoprolol XL  Pertinent Lab Values: Serum creatinine 1.30, BUN 30, Potassium 4.5, Sodium 135, Magnesium 2.1, A1c 7.2   Vital Signs: Weight: 196 lbs (admission weight 206 lbs) Blood pressure: 110/60s  Heart rate: 50-60s  I/O: -0.2L yesterday; net -1.5L  Medication Assistance / Insurance Benefits Check: Does the patient have prescription insurance?  Yes Type of insurance plan: UHC Medicare  Does the patient qualify for medication assistance through manufacturers or grants?   Yes Eligible grants and/or patient assistance programs: [employer] Medication assistance applications in progress: none  Medication assistance applications approved: [employer] Approved medication assistance renewals will be completed by: PCP  Outpatient Pharmacy:  Prior to admission outpatient pharmacy: [facility] Is the patient willing to use [facility] T[facility]  pharmacy at discharge? Yes Is the patient willing to transition their outpatient pharmacy to utilize a [facility] outpatient pharmacy?  Yes - transitioned to [facility] mail order    Assessment: 1. Acute on chronic systolic CHF (LVEF 30%), pending cath on 4/26. NYHA class II symptoms. - Not volume overloaded on exam, no indication for diuretics - Continue metoprolol tartrate 12.5 mg BID, will need to consolidate to metoprolol succinate for discharge - Holding PTA Entresto and spironolactone with low BP - Creatinine improved and stable from yesterday, consider restarting Farxiga after cath   Plan: 1) Medication changes recommended at this time: - Restart Farxiga after cath  2) Patient assistance: - Already receives patient assistance for [name] (through [employer]) and Farxiga (through [employer])  3)  Education  - Patient has been educated on current HF medications and potential additions to HF medication regimen - Patient verbalizes understanding that over the next few months, these medication doses may change and more medications may be added to optimize HF regimen - Patient has been educated on basic disease state pathophysiology and goals of therapy   Sharen Hones, PharmD, BCPS Heart Failure Engineer, building services Phone [phone]

## 2022-04-29 NOTE — Progress Notes (Signed)
ANTICOAGULATION CONSULT NOTE  Pharmacy Consult for warfarin  >> heparin Indication: pulmonary embolus  Allergies  Allergen Reactions   Augmentin [Amoxicillin-Pot Clavulanate] Itching and Other (See Comments)    Severe vaginal itching   Tranxene [Clorazepate] Itching   Crestor [Rosuvastatin]     Made her sick on stomach, she is able to tolerate the zocor   Penicillins Itching and Rash    Patient Measurements: Height: 5\' 7"  (170.2 cm) Weight: 89.1 kg (196 lb 8 oz) IBW/kg (Calculated) : 61.6 Heparin Dosing Weight:   Vital Signs: Temp: 97.6 F (36.4 C) (04/26 0354) Temp Source: Oral (04/26 0354) BP: 123/75 (04/26 0354) Pulse Rate: 57 (04/26 0354)  Labs: Recent Labs    04/27/22 0224 04/28/22 0316 04/29/22 0302  HGB 13.7  --  14.0  HCT 43.7  --  44.2  PLT 233  --  227  LABPROT 30.9* 25.1* 20.2*  INR 3.0* 2.3* 1.7*  CREATININE 1.53* 1.33* 1.30*     Estimated Creatinine Clearance: 39.6 mL/min (A) (by C-G formula based on SCr of 1.3 mg/dL (H)).   Medical History: Past Medical History:  Diagnosis Date   Acute massive pulmonary embolism (HCC) 07/13/2012   Massive PE w/ PEA arrest 07/13/12 >TNK >IVC filter >discharged on comadin     Anticoagulated on warfarin    Chronic diastolic CHF (congestive heart failure) (HCC) 03/15/2017   Chronic kidney disease, stage 3a (HCC) 12/15/2018   Diabetes mellitus, type 2 (HCC) 07/15/2012   with peripheral neuropathy   DM2 (diabetes mellitus, type 2) (HCC) 07/23/2012   HFrEF (heart failure with reduced ejection fraction) (HCC) 03/15/2017   Non-ischemic cardiomyopathy // Cardiac catheterization 01/01/21:  RCA prox to mid 20 // Echocardiogram 12/26/20:  EF 30-35, ant septum/apex, mid inf-sept AK; ant-lat/ant/inf HK, Gr 1 DD, mild LVH, normal RVSF, RVSP 27.2, small effusion (ant to RV), mild MR, mod TR, AV sclerosis w/o AS   Hyperlipemia 07/14/2012   Hyperlipidemia 02/25/2013   Hypertension 07/14/2012   Hypothyroid 06/06/2013   Large bowel  stricture (HCC)    s/p colectomy in 2016   Neuropathy 02/12/2015   Osteoarthritis    s/p hip and knee replacements   PAF (paroxysmal atrial fibrillation) (HCC)    Pulmonary hypertension (HCC) 07/26/2016   Syncope 10/2018   Thyroid disease      Assessment: 81 yoF with hx AF and PE on warfarin PTA (INR goal 2.5-3.5 given hx PE with therapeutic INR) admitted with RLE weakness. Pt also with CP, warfarin held with plans for cardiac cath once INR trends down.  INR today is 1.7, will begin heparin drip.  Goal of Therapy:  INR 2.5-3.5 (per anti-coag visit note) Heparin level 0.3-0.7 units/ml   Plan:  Heparin 1200 units/h Follow [facility] plans post/cath   Fredonia Highland, PharmD, BCPS, [facility] Clinical Pharmacist [phone] Please check AMION for all [facility] Pharmacy numbers 04/29/2022

## 2022-04-29 NOTE — Progress Notes (Signed)
OT Cancellation Note  Patient Details Name: Onda Kattner MRN: [id number] DOB: May 19, 1940   Cancelled Treatment:    Reason Eval/Treat Not Completed: Patient at procedure or test/ unavailable (Pt off floor likely for heart cath, per MD requesting OT see after procedure. OT to f/u as able)  04/29/2022  AB, OTR/L  Acute Rehabilitation Services  Office: [phone]  Tristan Schroeder 04/29/2022, 4:07 PM

## 2022-04-29 NOTE — Progress Notes (Signed)
   Left cardiac catheterization showed minimal CAD with only 20% stenosis of proximal to mid RCA. LVEDP 8 mmHg. Ventriculography consistent with recovering Takotsubo cardiomyopathy. Discussed with Dr. Cristal Deer - patient okay to be discharged from our perspective on current medications. Will stop Aspirin given no obstructive CAD and patient can be restarted on Coumadin tonight. Per Dr. Cristal Deer, she does not need to be bridged. Notified primary team who stated they would plan to discharge tomorrow morning. Will place order to restart Coumadin tonight and send a message to our office to help arrange an appointment in our Coumadin Clinic early next week.  Corrin Parker, PA-C 04/29/2022 5:34 PM

## 2022-04-30 DIAGNOSIS — R531 Weakness: Secondary | ICD-10-CM | POA: Diagnosis not present

## 2022-04-30 LAB — BASIC METABOLIC PANEL
Anion gap: 8 (ref 5–15)
BUN: 27 mg/dL — ABNORMAL HIGH (ref 8–23)
CO2: 22 mmol/L (ref 22–32)
Calcium: 8.8 mg/dL — ABNORMAL LOW (ref 8.9–10.3)
Chloride: 107 mmol/L (ref 98–111)
Creatinine, Ser: 1.41 mg/dL — ABNORMAL HIGH (ref 0.44–1.00)
GFR, Estimated: 38 mL/min — ABNORMAL LOW (ref >60)
Glucose, Bld: 91 mg/dL (ref 70–99)
Potassium: 4.7 mmol/L (ref 3.5–5.1)
Sodium: 137 mmol/L (ref 135–145)

## 2022-04-30 LAB — CBC
HCT: 45.2 % (ref 36.0–46.0)
Hemoglobin: 13.7 g/dL (ref 12.0–15.0)
MCH: 26.3 pg (ref 26.0–34.0)
MCHC: 30.3 g/dL (ref 30.0–36.0)
MCV: 86.8 fL (ref 80.0–100.0)
Platelets: 208 10*3/uL (ref 150–400)
RBC: 5.21 MIL/uL — ABNORMAL HIGH (ref 3.87–5.11)
RDW: 19.2 % — ABNORMAL HIGH (ref 11.5–15.5)
WBC: 8.7 10*3/uL (ref 4.0–10.5)
nRBC: 0 % (ref 0.0–0.2)

## 2022-04-30 LAB — PHOSPHORUS: Phosphorus: 3.1 mg/dL (ref 2.5–4.6)

## 2022-04-30 LAB — PROTIME-INR
INR: 1.5 — ABNORMAL HIGH (ref 0.8–1.2)
Prothrombin Time: 17.9 seconds — ABNORMAL HIGH (ref 11.4–15.2)

## 2022-04-30 LAB — MAGNESIUM: Magnesium: 2 mg/dL (ref 1.7–2.4)

## 2022-04-30 MED ORDER — NITROGLYCERIN 0.4 MG SL SUBL
0.4000 mg | SUBLINGUAL_TABLET | SUBLINGUAL | 3 refills | Status: DC | PRN
  Filled 2022-04-30: qty 50, 1d supply, fill #0
  Filled 2022-05-02: qty 25, 7d supply, fill #0

## 2022-04-30 MED ORDER — WARFARIN SODIUM 5 MG PO TABS
5.0000 mg | ORAL_TABLET | Freq: Once | ORAL | Status: AC
  Administered 2022-04-30: 5 mg via ORAL
  Filled 2022-04-30: qty 1

## 2022-04-30 MED ORDER — METOPROLOL TARTRATE 25 MG PO TABS
12.5000 mg | ORAL_TABLET | Freq: Two times a day (BID) | ORAL | 1 refills | Status: DC
  Filled 2022-04-30 – 2022-05-02 (×2): qty 60, 60d supply, fill #0

## 2022-04-30 NOTE — Discharge Instructions (Signed)
Advised to follow-up with primary care physician in 1 week. Advised to continue current medication. Patient has left heart cath.  Unremarkable unchanged from previous cath. Advised to continue Coumadin as per INR. Follow-up with neurology in 4 to 6 weeks.

## 2022-04-30 NOTE — Progress Notes (Addendum)
ANTICOAGULATION CONSULT NOTE  Pharmacy Consult to restart Warfarin  Indication: pulmonary embolus  Allergies  Allergen Reactions   Augmentin [Amoxicillin-Pot Clavulanate] Itching and Other (See Comments)    Severe vaginal itching   Tranxene [Clorazepate] Itching   Crestor [Rosuvastatin]     Made her sick on stomach, she is able to tolerate the zocor   Penicillins Itching and Rash    Patient Measurements: Height: 5\' 7"  (170.2 cm) Weight: 89.1 kg (196 lb 8 oz) IBW/kg (Calculated) : 61.6 Heparin Dosing Weight:   Vital Signs: Temp: 97.6 F (36.4 C) (04/27 0358) Temp Source: Oral (04/27 0358) BP: 142/81 (04/27 0358) Pulse Rate: 67 (04/27 0358)  Labs: Recent Labs    04/28/22 0316 04/29/22 0302 04/30/22 0226  HGB  --  14.0 13.7  HCT  --  44.2 45.2  PLT  --  227 208  LABPROT 25.1* 20.2* 17.9*  INR 2.3* 1.7* 1.5*  CREATININE 1.33* 1.30* 1.41*     Estimated Creatinine Clearance: 36.5 mL/min (A) (by C-G formula based on SCr of 1.41 mg/dL (H)).   Medical History: Past Medical History:  Diagnosis Date   Acute massive pulmonary embolism (HCC) 07/13/2012   Massive PE w/ PEA arrest 07/13/12 >TNK >IVC filter >discharged on comadin     Anticoagulated on warfarin    Chronic diastolic CHF (congestive heart failure) (HCC) 03/15/2017   Chronic kidney disease, stage 3a (HCC) 12/15/2018   Diabetes mellitus, type 2 (HCC) 07/15/2012   with peripheral neuropathy   DM2 (diabetes mellitus, type 2) (HCC) 07/23/2012   HFrEF (heart failure with reduced ejection fraction) (HCC) 03/15/2017   Non-ischemic cardiomyopathy // Cardiac catheterization 01/01/21:  RCA prox to mid 20 // Echocardiogram 12/26/20:  EF 30-35, ant septum/apex, mid inf-sept AK; ant-lat/ant/inf HK, Gr 1 DD, mild LVH, normal RVSF, RVSP 27.2, small effusion (ant to RV), mild MR, mod TR, AV sclerosis w/o AS   Hyperlipemia 07/14/2012   Hyperlipidemia 02/25/2013   Hypertension 07/14/2012   Hypothyroid 06/06/2013   Large bowel stricture  (HCC)    s/p colectomy in 2016   Neuropathy 02/12/2015   Osteoarthritis    s/p hip and knee replacements   PAF (paroxysmal atrial fibrillation) (HCC)    Pulmonary hypertension (HCC) 07/26/2016   Syncope 10/2018   Thyroid disease      Assessment: 81 yoF with hx AF and PE on warfarin PTA (INR goal 2.5-3.5 given hx PE with therapeutic INR) admitted with RLE weakness. Pt also with CP, warfarin held with plans for cardiac cath once INR trends down.  INR today is 1.7, will begin heparin drip.  UPDATE post Cath   S/p post cath today,  pharmacy cosnulted to  restart [place] tonight withoul bridge. Cath showed minimal CAD, recovering Takotsubo cardiomyopathy. Cardiology to arrange f/u appt for INR next week.   PTA warfarin last taken 04/23/22 PM.  Goal INR is 2.5-3.5.   Her prior to admission Warfarin dose was 5mg  every Wed/Fri, 2.5mg  all other days of the week  for hx PE.  INR 3.0 on 4/21 admit date>>> INR 1.7 today 04/29/22.   CBC wnl, stable.  INR up to 1.5 today, plan for dc home today.   Goal of Therapy:  INR 2.5-3.5 (per anti-coag visit note)    Plan:  Repeat Warfarin 5mg  x1 today  Daily INR  - If discharged 4/26,   I recommend Warfarin 5mg  daily with INR check Monday.   If INR check will be later than Monday 4/29, I would recommend reduce warfarin  to 2.5mg  on Monday 4/29, resuming her usual PTA dose  2.5 mg daily except 5mg  on Wed/Fridays.  Monitor for bleeding and contact Cardiologist if bleeding occurs.  Ulyses Southward, PharmD, BCIDP, AAHIVP, CPP Infectious Disease Pharmacist 04/30/2022 8:33 AM  Addendum  [place] to stop ASA per cards and Dr. Idelle Leech.  Ulyses Southward, PharmD, BCIDP, AAHIVP, CPP Infectious Disease Pharmacist 04/30/2022 8:40 AM

## 2022-04-30 NOTE — Discharge Summary (Signed)
Physician Discharge Summary  Orvella Digiulio AOZ:[id number] DOB: 1940-07-04 DOA: 04/24/2022  PCP: [name], MD  Admit date: 04/24/2022  Discharge date: 04/30/2022  Admitted From: Home.  Disposition:  Home.  Recommendations for Outpatient Follow-up:  Follow up with PCP in 1-2 weeks. Please obtain BMP/CBC in one week. Advised to continue current medications. Patient has left heart cath which was unchanged from previous cath. Advised to continue Coumadin as per INR. Follow-up with Neurology in 4 to 6 weeks.  Home Health: None Equipment/Devices: None  Discharge Condition: Stable CODE STATUS:Full code Diet recommendation: Heart Healthy   Brief [facility] Course: This 82 yrs old female with medical history significant of pulmonary hypertension, CAD, hypertension, paroxysmal atrial fibrillation, PE on Coumadin, type 2 diabetes, CKD 3A, hypothyroidism and hyperlipidemia who presents with right lower extremity weakness. Patient believes symptoms happened around 8pm. She was brought in the ED as a stroke code, feels mid-chest tightness since being in ED but thinks due to anxiety. Also has notable right UE intention tremor, She reports started 2 days ago. Neurology  and Cardiology is consulted.  She is admitted for further evaluation. Stroke workup completed.  Neurologist recommended to continue with therapeutic anticoagulation and follow-up in 4 to 6 weeks after discharge.  Cardiology wanted to left heart cath once INR is below 2.  She underwent left heart cath which was unremarkable from prior left heart cath.  Cardiologist recommended patient can be discharged,  no need to bridge with heparin.  Patient feels better and  wants to be discharged.  Patient being discharged home.  Discharge Diagnoses:  Principal Problem:   Right sided weakness Active Problems:   DM2 (diabetes mellitus, type 2) (HCC)   Hyperlipidemia   Hypothyroid   History of pulmonary embolism   Anticoagulated on  warfarin   Chronic kidney disease, stage 3a (HCC)   CAD (coronary artery disease)   PAF (paroxysmal atrial fibrillation) (HCC)   HTN (hypertension)   Chronic systolic CHF (congestive heart failure) (HCC)   S/P colostomy (HCC)   NSTEMI (non-ST elevated myocardial infarction) (HCC)  Right sided weakness: - She presented with RUE weakness on exam with right facial drop. Concerning for stroke.  - MRI brain without acute infarct noted. - CTA head and neck unremarkable, reviewed - 2d echo with EF 30%, wall motion abnormalities noted, no shunt seen.  - Continue atorvastatin - Continue anticoagulation with plan to transition to heparin gtt when INR<2 then resumption of coumadin once OK per Cardiology - Hb A1c 7.2, LDL 56 undergoal - PT/OT/SLT -Discussed case with Neurology on call. Stroke w/u essentially complete.  -Rec to continue with therapeutic anticoagulation per above and f/u with Neurology in 4-6 weeks after discharge.   NSTEMI: -LHC in 2022 with non-obstructive disease of LAD OR CIRCUMFLEX. Mild plaque to large, dominant RCA.  -initial trop 1600 on 4/22 with pt complaining of "indigestion." Trop did trend up to over 2200 -Chest discomfort eventually improved after NTG x 3 -Consulted Cardiology, appreciate recs. -2d echo noted per above. Given echo results, Cardio plans for heart cath once INR improves. Heart cath was unremarkable, unchanged from prior left heart cath.   S/P colostomy -secondary to hx of large bowel stricture, s/p colectomy . -daily colostomy care   Chronic systolic CHF (congestive heart failure) -Per cardiology, giving low dose metoprolol per Cardiology. No room for entresto, spironolactone -Consider farxiga prior to d/c -seems euvolemic at this time.   Essential hypertension: Continue metoprolol 12.5 mg twice daily.   PAF (  paroxysmal atrial fibrillation): -Had been on warfarin PTA -Per Cardiology, hold coumadin, transition to heparin gtt once INR<2    Chronic kidney disease, stage 3a (HCC): -Creatinine stable at baseline. -Avoid nephrotoxic medications.   History of pulmonary embolism: -On warfarin with goal of 2.5-3.5 PTA -Neurology recommends continuing anticoagulation per above -Per Cardiology, rec to hold coumadin and transition to heparin gtt once INR<2   Hypothyroidism: -Continue levothyroxine   Hyperlipidemia: -Continue statin   DM2 (diabetes mellitus, type 2): -no hyperglycemia -remote A1C 08/2021 was controlled 6.7, repeat A1c 7.2  Discharge Instructions  Discharge Instructions     AMB referral to Phase II Cardiac Rehabilitation   Complete by: As directed    Diagnosis: NSTEMI   After initial evaluation and assessments completed: Virtual Based Care may be provided alone or in conjunction with Phase 2 Cardiac Rehab based on patient barriers.: Yes   Intensive Cardiac Rehabilitation (ICR) [facility] location only OR Traditional Cardiac Rehabilitation (TCR) *If criteria for ICR are not met will enroll in TCR [facility] only): Yes   Call MD for:  difficulty breathing, headache or visual disturbances   Complete by: As directed    Call MD for:  persistant dizziness or light-headedness   Complete by: As directed    Call MD for:  persistant nausea and vomiting   Complete by: As directed    Diet - low sodium heart healthy   Complete by: As directed    Diet Carb Modified   Complete by: As directed    Discharge instructions   Complete by: As directed    Advised to follow-up with primary care physician in 1 week. Advised to continue current medication. Patient has left heart cath.  Unremarkable unchanged from previous cath. Advised to continue Coumadin as per INR.   Increase activity slowly   Complete by: As directed       Allergies as of 04/30/2022       Reactions   Augmentin [amoxicillin-pot Clavulanate] Itching, Other (See Comments)   Severe vaginal itching   Tranxene [clorazepate] Itching   Crestor [rosuvastatin]    Made  her sick on stomach, she is able to tolerate the zocor   Penicillins Itching, Rash        Medication List     STOP taking these medications    metoprolol succinate 50 MG 24 hr tablet Commonly known as: TOPROL-XL       TAKE these medications    acetaminophen 500 MG tablet Commonly known as: TYLENOL Take 1,000 mg by mouth every 6 (six) hours as needed for mild pain.   ascorbic acid 500 MG tablet Commonly known as: VITAMIN C Take 500 mg by mouth daily.   atorvastatin 40 MG tablet Commonly known as: LIPITOR Take 1 tablet (40 mg total) by mouth daily.   cyanocobalamin 1000 MCG tablet Commonly known as: VITAMIN B12 Take 1 tablet (1,000 mcg total) by mouth daily.   dapagliflozin propanediol 10 MG Tabs tablet Commonly known as: Farxiga Take 1 tablet (10 mg total) by mouth daily before breakfast.   diclofenac Sodium 1 % Gel Commonly known as: Voltaren Apply 4 g topically 4 (four) times daily. What changed:  when to take this reasons to take this   Entresto 24-26 MG Generic drug: sacubitril-valsartan Take 1 tablet by mouth 2 (two) times daily.   furosemide 20 MG tablet Commonly known as: LASIX Take 1 tablet (20 mg total) by mouth daily as needed for fluid or edema.   levothyroxine 25 MCG tablet Commonly  known as: SYNTHROID TAKE 1 TABLET BY MOUTH ONCE DAILY BEFORE BREAKFAST   metoprolol tartrate 25 MG tablet Commonly known as: LOPRESSOR Take 0.5 tablets (12.5 mg total) by mouth 2 (two) times daily.   nitroGLYCERIN 0.4 MG SL tablet Commonly known as: NITROSTAT Place 1 tablet (0.4 mg total) under the tongue every 5 (five) minutes as needed for chest pain.   OneTouch Delica Lancets 30G Misc USE 1  TO CHECK GLUCOSE ONCE DAILY   OneTouch Verio test strip Generic drug: glucose blood USE 1 STRIP TO CHECK GLUCOSE ONCE DAILY AS DIRECTED   pantoprazole 40 MG tablet Commonly known as: PROTONIX Take 1 tablet (40 mg total) by mouth daily. What changed: when to take  this   spironolactone 25 MG tablet Commonly known as: ALDACTONE Take 1/2 (one-half) tablet by mouth once daily What changed: See the new instructions.   warfarin 5 MG tablet Commonly known as: COUMADIN Take as directed. If you are unsure how to take this medication, talk to your nurse or doctor. Original instructions: TAKE 1-2 TABLETS BY MOUTH ONCE DAILY OR AS DIRECTED  BY  COUMADIN  CLINIC What changed:  how much to take how to take this when to take this additional instructions        Follow-up Information     [facility]. Go in 16 day(s).   Specialty: Cardiology Why: Hospital follow up 05/16/2022 @ 3 pm PLEASE bring a current medication list to appointment FREE valet parking, Entrance C, off [employer] information: [address] [id number] mc [place] [address] [phone]        [name], MD Follow up in 1 week(s).   Specialty: Family Medicine Contact information: [address] Christena Flake [place] [address] [phone]         [facility] Follow up in 6 week(s).   Contact information: [address]     [address] [place] [address] [phone]               Allergies  Allergen Reactions   Augmentin [Amoxicillin-Pot Clavulanate] Itching and Other (See Comments)    Severe vaginal itching   Tranxene [Clorazepate] Itching   Crestor [Rosuvastatin]     Made her sick on stomach, she is able to tolerate the zocor   Penicillins Itching and Rash    Consultations: Neurology Cardiology   Procedures/Studies: CARDIAC CATHETERIZATION  Result Date: 04/29/2022   Prox RCA to Mid RCA lesion is 20% stenosed. 1.  Minimal obstructive coronary artery disease relatively unchanged versus prior study. 2.  LVEDP of 8 mmHg and ventriculography consistent with recovering Takotsubo cardiomyopathy. 3.  Occlusion of the right radial  artery in the upper forearm. Recommendation: Medical therapy.   ECHOCARDIOGRAM COMPLETE  Result Date: 04/25/2022    ECHOCARDIOGRAM REPORT   Patient Name:   Hannah [name] Date of Exam: 04/25/2022 Medical Rec #:  [id number]    Height:       67.0 in Accession #:    [id number]   Weight:       195.6 lb Date of Birth:  12-03-1940    BSA:          2.003 m Patient Age:    81 years     BP:           126/75 mmHg Patient Gender: F            HR:  102 bpm. Exam Location:  Inpatient Procedure: 2D Echo, Color Doppler, Cardiac Doppler and Intracardiac            Opacification Agent REPORT CONTAINS CRITICAL RESULT Indications:    Stroke  History:        Patient has prior history of Echocardiogram examinations, most                 recent 05/24/2021. HFrEF and CHF, Pulmonary HTN,                 Arrythmias:Atrial Fibrillation, Signs/Symptoms:Syncope; Risk                 Factors:Hypertension, Dyslipidemia and Diabetes. CKD.  Sonographer:    Milda Smart Referring Phys: [id number] CHING T TU  Sonographer Comments: Technically difficult study due to poor echo windows. Image acquisition challenging due to patient body habitus and Image acquisition challenging due to respiratory motion. IMPRESSIONS  1. Left ventricular ejection fraction, by estimation, is 30%. The left ventricle has moderately decreased function. The left ventricle demonstrates regional wall motion abnormalities with hyperkinetic basal LV segments and akinesis of the mid to apical anteroseptal, mid-apical inferoseptal, apical lateral, apical anterior, and mid-apical inferior walls as well as the true apex. With hyperdynamic basal LV contraction, there is mitral valve systolic anterior motion with LV outflow tract gradient, peak 45  mmHg. These findings are consistent with large wrap-around LAD territory MI or a stress (Takotsubo-type) cardiomyopathy. There is mild left ventricular hypertrophy. Left ventricular diastolic parameters are consistent with Grade I  diastolic dysfunction (impaired relaxation).  2. Right ventricular systolic function is normal. The right ventricular size is normal. There is normal pulmonary artery systolic pressure. The estimated right ventricular systolic pressure is 30.2 mmHg.  3. There was systolic anterior motion of the anterior mitral leaflet. The mitral valve is abnormal. Mild to moderate mitral valve regurgitation. No evidence of mitral stenosis.  4. The aortic valve is tricuspid. There is mild calcification of the aortic valve. Aortic valve regurgitation is not visualized. No aortic stenosis is present.  5. The inferior vena cava is normal in size with greater than 50% respiratory variability, suggesting right atrial pressure of 3 mmHg. FINDINGS  Left Ventricle: Left ventricular ejection fraction, by estimation, is 30%. The left ventricle has moderately decreased function. The left ventricle demonstrates regional wall motion abnormalities. Definity contrast agent was given IV to delineate the left ventricular endocardial borders. The left ventricular internal cavity size was normal in size. There is mild left ventricular hypertrophy. Left ventricular diastolic parameters are consistent with Grade I diastolic dysfunction (impaired relaxation). Right Ventricle: The right ventricular size is normal. No increase in right ventricular wall thickness. Right ventricular systolic function is normal. There is normal pulmonary artery systolic pressure. The tricuspid regurgitant velocity is 2.61 m/s, and  with an assumed right atrial pressure of 3 mmHg, the estimated right ventricular systolic pressure is 30.2 mmHg. Left Atrium: Left atrial size was normal in size. Right Atrium: Right atrial size was normal in size. Pericardium: There is no evidence of pericardial effusion. Mitral Valve: There was systolic anterior motion of the anterior mitral leaflet. The mitral valve is abnormal. Mild to moderate mitral annular calcification. Mild to moderate  mitral valve regurgitation. No evidence of mitral valve stenosis. Tricuspid Valve: The tricuspid valve is normal in structure. Tricuspid valve regurgitation is trivial. Aortic Valve: The aortic valve is tricuspid. There is mild calcification of the aortic valve. Aortic valve regurgitation is not visualized. No aortic stenosis  is present. Pulmonic Valve: The pulmonic valve was normal in structure. Pulmonic valve regurgitation is not visualized. Aorta: The aortic root is normal in size and structure. Venous: The inferior vena cava is normal in size with greater than 50% respiratory variability, suggesting right atrial pressure of 3 mmHg. IAS/Shunts: No atrial level shunt detected by color flow Doppler.  LEFT VENTRICLE PLAX 2D LVIDd:         3.00 cm LVIDs:         2.10 cm LV PW:         1.20 cm LV IVS:        1.20 cm LVOT diam:     2.00 cm LVOT Area:     3.14 cm  LEFT ATRIUM             Index        RIGHT ATRIUM          Index LA diam:        3.10 cm 1.55 cm/m   RA Area:     8.57 cm LA Vol (A2C):   30.6 ml 15.28 ml/m  RA Volume:   13.30 ml 6.64 ml/m LA Vol (A4C):   16.8 ml 8.39 ml/m LA Biplane Vol: 23.6 ml 11.78 ml/m   AORTA Ao Root diam: 2.80 cm Ao Asc diam:  2.90 cm TRICUSPID VALVE TR Peak grad:   27.2 mmHg TR Mean grad:   15.0 mmHg TR Vmax:        261.00 cm/s TR Vmean:       189.0 cm/s  SHUNTS Systemic Diam: 2.00 cm Dalton McleanMD Electronically signed by Wilfred Lacy Signature Date/Time: 04/25/2022/4:12:17 PM    Final    MR BRAIN WO CONTRAST  Result Date: 04/25/2022 CLINICAL DATA:  Neuro deficit, acute, stroke suspected right upper and lower extremity weakness, right facial droop EXAM: MRI HEAD WITHOUT CONTRAST TECHNIQUE: Multiplanar, multiecho pulse sequences of the brain and surrounding structures were obtained without intravenous contrast. COMPARISON:  MRI October 31, 2018.  CT head April 24, 2022. FINDINGS: Brain: No acute infarction, hemorrhage, hydrocephalus, extra-axial collection or mass  lesion. Cerebral atrophy with probably ex vacuo ventricular dilation. Mild for age T2/FLAIR hyperintensities in the white matter, compatible with chronic microvascular ischemic disease. Vascular: Major arterial flow voids maintained at the skull base. Skull and upper cervical spine: Normal marrow signal. Sinuses/Orbits: Largely clear sinuses.  No acute orbital findings. Other: No mastoid effusions. IMPRESSION: 1. No evidence of acute intracranial abnormality. 2. Cerebral Atrophy (ICD10-G31.9) with probably ex vacuo ventricular dilation. Electronically Signed   By: Feliberto Harts M.D.   On: 04/25/2022 12:09   CT ANGIO HEAD NECK W WO CM  Result Date: 04/25/2022 CLINICAL DATA:  Acute neurologic deficit EXAM: CT ANGIOGRAPHY HEAD AND NECK WITH AND WITHOUT CONTRAST TECHNIQUE: Multidetector CT imaging of the head and neck was performed using the standard protocol during bolus administration of intravenous contrast. Multiplanar CT image reconstructions and MIPs were obtained to evaluate the vascular anatomy. Carotid stenosis measurements (when applicable) are obtained utilizing NASCET criteria, using the distal internal carotid diameter as the denominator. RADIATION DOSE REDUCTION: This exam was performed according to the departmental dose-optimization program which includes automated exposure control, adjustment of the mA and/or kV according to patient size and/or use of iterative reconstruction technique. CONTRAST:  75mL OMNIPAQUE IOHEXOL 350 MG/ML SOLN COMPARISON:  None Available. FINDINGS: CTA NECK FINDINGS SKELETON: C4-6 ossification of the posterior longitudinal ligament with moderate to severe spinal canal stenosis. OTHER NECK: Normal pharynx,  larynx and major salivary glands. No cervical lymphadenopathy. Unremarkable thyroid gland. UPPER CHEST: No pneumothorax or pleural effusion. No nodules or masses. AORTIC ARCH: There is calcific atherosclerosis of the aortic arch. There is no aneurysm, dissection or  hemodynamically significant stenosis of the visualized portion of the aorta. Normal variant aortic arch branching pattern with the brachiocephalic and left common carotid arteries sharing a common origin. The visualized proximal subclavian arteries are widely patent. RIGHT CAROTID SYSTEM: No dissection, occlusion or aneurysm. Mild atherosclerotic calcification at the carotid bifurcation without hemodynamically significant stenosis. LEFT CAROTID SYSTEM: No dissection, occlusion or aneurysm. Mild atherosclerotic calcification at the carotid bifurcation without hemodynamically significant stenosis. VERTEBRAL ARTERIES: Left dominant configuration. Both origins are clearly patent. There is no dissection, occlusion or flow-limiting stenosis to the skull base (V1-V3 segments). CTA HEAD FINDINGS POSTERIOR CIRCULATION: --Vertebral arteries: Normal V4 segments. --Inferior cerebellar arteries: Normal. --Basilar artery: Normal. --Superior cerebellar arteries: Normal. --Posterior cerebral arteries (PCA): Normal. ANTERIOR CIRCULATION: --Intracranial internal carotid arteries: Normal. --Anterior cerebral arteries (ACA): Normal. Both A1 segments are present. Patent anterior communicating artery (a-comm). --Middle cerebral arteries (MCA): Normal. VENOUS SINUSES: As permitted by contrast timing, patent. ANATOMIC VARIANTS: Fetal origins of both posterior cerebral arteries. Review of the MIP images confirms the above findings. IMPRESSION: 1. No emergent large vessel occlusion or high-grade stenosis of the intracranial arteries. 2. C4-6 ossification of the posterior longitudinal ligament with moderate to severe spinal canal stenosis. Aortic atherosclerosis (ICD10-I70.0). Electronically Signed   By: Deatra Robinson M.D.   On: 04/25/2022 02:02   CT HEAD CODE STROKE WO CONTRAST  Result Date: 04/24/2022 CLINICAL DATA:  Code stroke.  Acute neurologic deficit EXAM: CT HEAD WITHOUT CONTRAST TECHNIQUE: Contiguous axial images were obtained  from the base of the skull through the vertex without intravenous contrast. RADIATION DOSE REDUCTION: This exam was performed according to the departmental dose-optimization program which includes automated exposure control, adjustment of the mA and/or kV according to patient size and/or use of iterative reconstruction technique. COMPARISON:  02/10/2022 FINDINGS: Brain: There is no mass, hemorrhage or extra-axial collection. There is generalized atrophy without lobar predilection. There is hypoattenuation of the periventricular white matter, most commonly indicating chronic ischemic microangiopathy. Vascular: No abnormal hyperdensity of the major intracranial arteries or dural venous sinuses. No intracranial atherosclerosis. Skull: The visualized skull base, calvarium and extracranial soft tissues are normal. Sinuses/Orbits: No fluid levels or advanced mucosal thickening of the visualized paranasal sinuses. No mastoid or middle ear effusion. The orbits are normal. ASPECTS [facility] Stroke Program Early CT Score) - Ganglionic level infarction (caudate, lentiform nuclei, internal capsule, insula, M1-M3 cortex): 7 - Supraganglionic infarction (M4-M6 cortex): 3 Total score (0-10 with 10 being normal): 10 IMPRESSION: 1. No acute intracranial abnormality. 2. ASPECTS is 10. Electronically Signed   By: Deatra Robinson M.D.   On: 04/24/2022 23:08     Subjective: Patient was seen and examined at bedside.  Overnight events noted.   Patient report doing much better and wants to be discharged.  Discharge Exam: Vitals:   04/30/22 0844 04/30/22 0900  BP: 125/81 126/73  Pulse: 71   Resp:    Temp:    SpO2:     Vitals:   04/29/22 1955 04/30/22 0358 04/30/22 0844 04/30/22 0900  BP: (!) 142/59 (!) 142/81 125/81 126/73  Pulse: 74 67 71   Resp: 20 16    Temp: 97.8 F (36.6 C) 97.6 F (36.4 C)    TempSrc:  Oral    SpO2: 98% 100%  Weight:      Height:        General: Pt is alert, awake, not in acute  distress Cardiovascular: RRR, S1/S2 +, no rubs, no gallops Respiratory: CTA bilaterally, no wheezing, no rhonchi Abdominal: Soft, NT, ND, bowel sounds + Extremities: no edema, no cyanosis    The results of significant diagnostics from this hospitalization (including imaging, microbiology, ancillary and laboratory) are listed below for reference.     Microbiology: No results found for this or any previous visit (from the past 240 hour(s)).   Labs: BNP (last 3 results) Recent Labs    11/23/21 2059  BNP 109.4*   Basic Metabolic Panel: Recent Labs  Lab 04/26/22 0501 04/27/22 0224 04/28/22 0316 04/29/22 0302 04/30/22 0226  NA 137 135 137 135 137  K 3.7 3.6 3.7 4.5 4.7  CL 103 103 104 105 107  CO2 23 21* 22 20* 22  GLUCOSE 115* 116* 97 99 91  BUN 33* 34* 34* 30* 27*  CREATININE 1.58* 1.53* 1.33* 1.30* 1.41*  CALCIUM 8.4* 8.5* 8.5* 8.6* 8.8*  MG  --   --  2.0 2.1 2.0  PHOS  --   --  3.3 3.1 3.1   Liver Function Tests: Recent Labs  Lab 04/24/22 2321 04/26/22 0501 04/27/22 0224  AST 22 20 18   ALT 14 11 12   ALKPHOS 69 51 53  BILITOT 1.0 1.1 1.1  PROT 7.7 6.0* 5.9*  ALBUMIN 3.8 3.0* 2.8*   No results for input(s): "LIPASE", "AMYLASE" in the last 168 hours. No results for input(s): "AMMONIA" in the last 168 hours. CBC: Recent Labs  Lab 04/24/22 2321 04/24/22 2336 04/26/22 0501 04/27/22 0224 04/29/22 0302 04/30/22 0226  WBC 11.1*  --  8.9 8.4 8.5 8.7  NEUTROABS 8.0*  --   --   --   --   --   HGB 15.5* 17.7* 13.7 13.7 14.0 13.7  HCT 50.4* 52.0* 44.3 43.7 44.2 45.2  MCV 85.7  --  86.0 84.7 85.0 86.8  PLT 261  --  224 233 227 208   Cardiac Enzymes: No results for input(s): "CKTOTAL", "CKMB", "CKMBINDEX", "TROPONINI" in the last 168 hours. BNP: Invalid input(s): "POCBNP" CBG: Recent Labs  Lab 04/29/22 1656  GLUCAP 89   D-Dimer No results for input(s): "DDIMER" in the last 72 hours. Hgb A1c No results for input(s): "HGBA1C" in the last 72  hours. Lipid Profile No results for input(s): "CHOL", "HDL", "LDLCALC", "TRIG", "CHOLHDL", "LDLDIRECT" in the last 72 hours. Thyroid function studies No results for input(s): "TSH", "T4TOTAL", "T3FREE", "THYROIDAB" in the last 72 hours.  Invalid input(s): "FREET3" Anemia work up No results for input(s): "VITAMINB12", "FOLATE", "FERRITIN", "TIBC", "IRON", "RETICCTPCT" in the last 72 hours. Urinalysis    Component Value Date/Time   COLORURINE YELLOW 04/25/2022 0044   APPEARANCEUR CLEAR 04/25/2022 0044   LABSPEC 1.026 04/25/2022 0044   PHURINE 5.0 04/25/2022 0044   GLUCOSEU >=500 (A) 04/25/2022 0044   HGBUR SMALL (A) 04/25/2022 0044   BILIRUBINUR NEGATIVE 04/25/2022 0044   KETONESUR 5 (A) 04/25/2022 0044   PROTEINUR 30 (A) 04/25/2022 0044   UROBILINOGEN 0.2 03/14/2017 1102   NITRITE NEGATIVE 04/25/2022 0044   LEUKOCYTESUR NEGATIVE 04/25/2022 0044   Sepsis Labs Recent Labs  Lab 04/26/22 0501 04/27/22 0224 04/29/22 0302 04/30/22 0226  WBC 8.9 8.4 8.5 8.7   Microbiology No results found for this or any previous visit (from the past 240 hour(s)).   Time coordinating discharge: Over 30 minutes  SIGNED:  Willeen Niece, MD  [facility] 04/30/2022, 2:52 PM Pager   If 7PM-7AM, please contact night-coverage

## 2022-04-30 NOTE — Plan of Care (Signed)
Problem: Education: Goal: Knowledge of disease or condition will improve Outcome: Adequate for Discharge Goal: Knowledge of secondary prevention will improve (MUST DOCUMENT ALL) Outcome: Adequate for Discharge Goal: Knowledge of patient specific risk factors will improve Hannah [name] N/A or DELETE if not current risk factor) Outcome: Adequate for Discharge   Problem: Ischemic Stroke/TIA Tissue Perfusion: Goal: Complications of ischemic stroke/TIA will be minimized Outcome: Adequate for Discharge   Problem: Coping: Goal: Will verbalize positive feelings about self Outcome: Adequate for Discharge Goal: Will identify appropriate support needs Outcome: Adequate for Discharge   Problem: Health Behavior/Discharge Planning: Goal: Ability to manage health-related needs will improve Outcome: Adequate for Discharge Goal: Goals will be collaboratively established with patient/family Outcome: Adequate for Discharge   Problem: Self-Care: Goal: Ability to participate in self-care as condition permits will improve Outcome: Adequate for Discharge Goal: Verbalization of feelings and concerns over difficulty with self-care will improve Outcome: Adequate for Discharge Goal: Ability to communicate needs accurately will improve Outcome: Adequate for Discharge   Problem: Nutrition: Goal: Risk of aspiration will decrease Outcome: Adequate for Discharge Goal: Dietary intake will improve Outcome: Adequate for Discharge   Problem: Education: Goal: Knowledge of General Education information will improve Description: Including pain rating scale, medication(s)/side effects and non-pharmacologic comfort measures Outcome: Adequate for Discharge   Problem: Health Behavior/Discharge Planning: Goal: Ability to manage health-related needs will improve Outcome: Adequate for Discharge   Problem: Clinical Measurements: Goal: Ability to maintain clinical measurements within normal limits will improve Outcome:  Adequate for Discharge Goal: Will remain free from infection Outcome: Adequate for Discharge Goal: Diagnostic test results will improve Outcome: Adequate for Discharge Goal: Respiratory complications will improve Outcome: Adequate for Discharge Goal: Cardiovascular complication will be avoided Outcome: Adequate for Discharge   Problem: Activity: Goal: Risk for activity intolerance will decrease Outcome: Adequate for Discharge   Problem: Nutrition: Goal: Adequate nutrition will be maintained Outcome: Adequate for Discharge   Problem: Coping: Goal: Level of anxiety will decrease Outcome: Adequate for Discharge   Problem: Elimination: Goal: Will not experience complications related to bowel motility Outcome: Adequate for Discharge Goal: Will not experience complications related to urinary retention Outcome: Adequate for Discharge   Problem: Pain Managment: Goal: General experience of comfort will improve Outcome: Adequate for Discharge   Problem: Safety: Goal: Ability to remain free from injury will improve Outcome: Adequate for Discharge   Problem: Skin Integrity: Goal: Risk for impaired skin integrity will decrease Outcome: Adequate for Discharge   Problem: Education: Goal: Knowledge of disease or condition will improve Outcome: Adequate for Discharge Goal: Knowledge of secondary prevention will improve (MUST DOCUMENT ALL) Outcome: Adequate for Discharge Goal: Knowledge of patient specific risk factors will improve Hannah [name] N/A or DELETE if not current risk factor) Outcome: Adequate for Discharge   Problem: Ischemic Stroke/TIA Tissue Perfusion: Goal: Complications of ischemic stroke/TIA will be minimized Outcome: Adequate for Discharge   Problem: Coping: Goal: Will verbalize positive feelings about self Outcome: Adequate for Discharge Goal: Will identify appropriate support needs Outcome: Adequate for Discharge   Problem: Health Behavior/Discharge  Planning: Goal: Ability to manage health-related needs will improve Outcome: Adequate for Discharge Goal: Goals will be collaboratively established with patient/family Outcome: Adequate for Discharge   Problem: Self-Care: Goal: Ability to participate in self-care as condition permits will improve Outcome: Adequate for Discharge Goal: Verbalization of feelings and concerns over difficulty with self-care will improve Outcome: Adequate for Discharge Goal: Ability to communicate needs accurately will improve Outcome: Adequate for Discharge   Problem: Nutrition:  Goal: Risk of aspiration will decrease Outcome: Adequate for Discharge Goal: Dietary intake will improve Outcome: Adequate for Discharge   Problem: Education: Goal: Understanding of CV disease, CV risk reduction, and recovery process will improve Outcome: Adequate for Discharge Goal: Individualized Educational Video(s) Outcome: Adequate for Discharge   Problem: Activity: Goal: Ability to return to baseline activity level will improve Outcome: Adequate for Discharge   Problem: Cardiovascular: Goal: Ability to achieve and maintain adequate cardiovascular perfusion will improve Outcome: Adequate for Discharge Goal: Vascular access site(s) Level 0-1 will be maintained Outcome: Adequate for Discharge   Problem: Health Behavior/Discharge Planning: Goal: Ability to safely manage health-related needs after discharge will improve Outcome: Adequate for Discharge

## 2022-05-02 ENCOUNTER — Encounter (HOSPITAL_COMMUNITY): Payer: Self-pay | Admitting: Internal Medicine

## 2022-05-02 ENCOUNTER — Telehealth: Payer: Self-pay

## 2022-05-02 ENCOUNTER — Ambulatory Visit: Payer: Self-pay

## 2022-05-02 ENCOUNTER — Other Ambulatory Visit (HOSPITAL_COMMUNITY): Payer: Self-pay

## 2022-05-02 MED FILL — Heparin Sodium (Porcine) Inj 1000 Unit/ML: INTRAMUSCULAR | Qty: 10 | Status: AC

## 2022-05-02 MED FILL — Verapamil HCl IV Soln 2.5 MG/ML: INTRAVENOUS | Qty: 2 | Status: AC

## 2022-05-02 NOTE — Transitions of Care (Post Inpatient/ED Visit) (Signed)
   05/02/2022  Name: Hannah [name] MRN: [id number] DOB: Nov 21, 1940  Today's TOC FU Call Status: Today's TOC FU Call Status:: Successful TOC FU Call Competed TOC FU Call Complete Date: 05/02/22  Transition Care Management Follow-up Telephone Call Date of Discharge: 04/30/22 Discharge Facility: [facility]) Type of Discharge: Inpatient Admission Primary Inpatient Discharge Diagnosis:: "stroke like sxs" How have you been since you were released from the hospital?: Better (Pt states she is "doing okay beside dealing with some personal issues"-that she did not wish to discuss at this time. Appetite is good. She is managing her own colostomy bag. She is sleeping okay at nights-naps during the day some.) Any questions or concerns?: No  Items Reviewed: Did you receive and understand the discharge instructions provided?: Yes Medications obtained and verified?: Yes (Medications Reviewed) Any new allergies since your discharge?: No Dietary orders reviewed?: Yes Type of Diet Ordered:: low salt/heart healthy/carb modified Do you have support at home?: Yes People in Home: alone Name of Support/Comfort Primary Source: she has dtrs and grandsons that live nearby and check on her often, also has paid caregiver in the home to assist as needed  Home Care and Equipment/Supplies: Were Home Health Services Ordered?: NA Any new equipment or medical supplies ordered?: NA  Functional Questionnaire: Do you need assistance with bathing/showering or dressing?: No Do you need assistance with meal preparation?: No Do you need assistance with eating?: No Do you have difficulty maintaining continence: No Do you need assistance with getting out of bed/getting out of a chair/moving?: No Do you have difficulty managing or taking your medications?: No  Follow up appointments reviewed: PCP Follow-up appointment confirmed?: Yes Date of PCP follow-up appointment?: 05/12/22 Follow-up Provider: Dr.  [name] [facility] Follow-up appointment confirmed?: Yes Date of Specialist follow-up appointment?: 05/16/22 Follow-Up Specialty Provider:: Cardiac Clinic Do you need transportation to your follow-up appointment?: No (pt states her grandson takes her to all her appts) Do you understand care options if your condition(s) worsen?: Yes-patient verbalized understanding  SDOH Interventions Today    Flowsheet Row Most Recent Value  SDOH Interventions   Food Insecurity Interventions Intervention Not Indicated  Transportation Interventions Intervention Not Indicated       TOC Interventions Today    Flowsheet Row Most Recent Value  TOC Interventions   TOC Interventions Discussed/Reviewed TOC Interventions Discussed      Interventions Today    Flowsheet Row Most Recent Value  General Interventions   General Interventions Discussed/Reviewed General Interventions Discussed, Doctor Visits  Doctor Visits Discussed/Reviewed Doctor Visits Discussed, PCP, Specialist  PCP/Specialist Visits Compliance with follow-up visit  [reviewd post hosp f/u appts wiht pt and aide]  Education Interventions   Education Provided Provided Education  Provided Verbal Education On Nutrition, When to see the doctor, Medication, Other  Nutrition Interventions   Nutrition Discussed/Reviewed Nutrition Discussed, Adding fruits and vegetables, Decreasing sugar intake, Decreasing salt  Pharmacy Interventions   Pharmacy Dicussed/Reviewed Pharmacy Topics Discussed, Medications and their functions  Safety Interventions   Safety Discussed/Reviewed Safety Discussed, Home Safety  [pt has paid caregiver/aide in the home]        Antionette Fairy, RN,BSN,CCM [facility] Care Management Care Management Community Coordinator Direct Phone: [phone] Toll Free: [phone] Fax: [phone]

## 2022-05-02 NOTE — Chronic Care Management (AMB) (Signed)
   05/02/2022  Hannah [name] March 28, 1940 [id number]   Reason for Encounter: Patient is not currently enrolled in the CCM program. CCM status changed to previously enrolled  Alto Denver RN, MSN, CCM RN Care Manager  Chronic Care Management Direct Number: [phone]

## 2022-05-03 LAB — LIPOPROTEIN A (LPA): Lipoprotein (a): 161.2 nmol/L — ABNORMAL HIGH (ref <75.0)

## 2022-05-04 ENCOUNTER — Ambulatory Visit: Payer: Medicare Other | Attending: Cardiovascular Disease

## 2022-05-04 DIAGNOSIS — I2699 Other pulmonary embolism without acute cor pulmonale: Secondary | ICD-10-CM | POA: Diagnosis not present

## 2022-05-04 DIAGNOSIS — Z5181 Encounter for therapeutic drug level monitoring: Secondary | ICD-10-CM

## 2022-05-04 LAB — POCT INR: INR: 1.9 — AB (ref 2.0–3.0)

## 2022-05-04 NOTE — Patient Instructions (Signed)
Description   Take 1.5 tablets today and then continue taking Warfarin 1/2 tablet daily except for 1 tablet on Wednesdays and Fridays.  Keep a green leafy veggie in your diet 2 times a week.  Recheck INR in 2 weeks.  Coumadin Clinic [phone].

## 2022-05-12 ENCOUNTER — Ambulatory Visit: Payer: Medicare Other | Admitting: Family Medicine

## 2022-05-12 DIAGNOSIS — E1142 Type 2 diabetes mellitus with diabetic polyneuropathy: Secondary | ICD-10-CM

## 2022-05-13 ENCOUNTER — Ambulatory Visit (INDEPENDENT_AMBULATORY_CARE_PROVIDER_SITE_OTHER): Payer: Medicare Other | Admitting: Family Medicine

## 2022-05-13 ENCOUNTER — Encounter: Payer: Self-pay | Admitting: Family Medicine

## 2022-05-13 VITALS — BP 116/78 | HR 105 | Temp 97.5°F | Ht 67.0 in | Wt 186.5 lb

## 2022-05-13 DIAGNOSIS — N1831 Chronic kidney disease, stage 3a: Secondary | ICD-10-CM

## 2022-05-13 DIAGNOSIS — G459 Transient cerebral ischemic attack, unspecified: Secondary | ICD-10-CM | POA: Diagnosis not present

## 2022-05-13 DIAGNOSIS — E039 Hypothyroidism, unspecified: Secondary | ICD-10-CM | POA: Diagnosis not present

## 2022-05-13 DIAGNOSIS — N184 Chronic kidney disease, stage 4 (severe): Secondary | ICD-10-CM

## 2022-05-13 DIAGNOSIS — E1142 Type 2 diabetes mellitus with diabetic polyneuropathy: Secondary | ICD-10-CM | POA: Diagnosis not present

## 2022-05-13 DIAGNOSIS — Z7984 Long term (current) use of oral hypoglycemic drugs: Secondary | ICD-10-CM | POA: Diagnosis not present

## 2022-05-13 DIAGNOSIS — R29898 Other symptoms and signs involving the musculoskeletal system: Secondary | ICD-10-CM | POA: Diagnosis not present

## 2022-05-13 DIAGNOSIS — Z7901 Long term (current) use of anticoagulants: Secondary | ICD-10-CM

## 2022-05-13 LAB — BASIC METABOLIC PANEL
BUN: 34 mg/dL — ABNORMAL HIGH (ref 6–23)
CO2: 19 mEq/L (ref 19–32)
Calcium: 9.2 mg/dL (ref 8.4–10.5)
Chloride: 100 mEq/L (ref 96–112)
Creatinine, Ser: 2.21 mg/dL — ABNORMAL HIGH (ref 0.40–1.20)
GFR: 20.47 mL/min — ABNORMAL LOW (ref >60.00)
Glucose, Bld: 118 mg/dL — ABNORMAL HIGH (ref 70–99)
Potassium: 3.8 mEq/L (ref 3.5–5.1)
Sodium: 135 mEq/L (ref 135–145)

## 2022-05-13 LAB — CBC
HCT: 47.2 % — ABNORMAL HIGH (ref 36.0–46.0)
Hemoglobin: 15.2 g/dL — ABNORMAL HIGH (ref 12.0–15.0)
MCHC: 32.2 g/dL (ref 30.0–36.0)
MCV: 85 fl (ref 78.0–100.0)
Platelets: 220 10*3/uL (ref 150.0–400.0)
RBC: 5.55 Mil/uL — ABNORMAL HIGH (ref 3.87–5.11)
RDW: 20 % — ABNORMAL HIGH (ref 11.5–15.5)
WBC: 7.9 10*3/uL (ref 4.0–10.5)

## 2022-05-13 MED ORDER — ONETOUCH VERIO VI STRP
ORAL_STRIP | 3 refills | Status: DC
Start: 2022-05-13 — End: 2022-08-28

## 2022-05-13 MED ORDER — ONETOUCH DELICA LANCETS 30G MISC
3 refills | Status: DC
Start: 2022-05-13 — End: 2022-08-28

## 2022-05-13 MED ORDER — LEVOTHYROXINE SODIUM 25 MCG PO TABS
25.0000 ug | ORAL_TABLET | Freq: Every day | ORAL | 1 refills | Status: DC
Start: 2022-05-13 — End: 2022-06-08

## 2022-05-13 NOTE — Progress Notes (Signed)
Established Patient Office Visit  Subjective   Patient ID: Hannah [name], female    DOB: February 11, 1940  Age: 82 y.o. MRN: [id number]  Chief Complaint  Patient presents with   Hospitalization Follow-up    Pt is here for hospital follow up. This 82 yrs old female with medical history significant of pulmonary hypertension, CAD, hypertension, paroxysmal atrial fibrillation, PE on Coumadin, type 2 diabetes, CKD 3A, hypothyroidism and hyperlipidemia who presents with right lower extremity weakness. Patient believes symptoms happened around 8pm. She was brought in the ED as a stroke code, feels mid-chest tightness since being in ED but thinks due to anxiety. Also has notable right UE intention tremor, She reports started 2 days ago. Neurology  and Cardiology is consulted.  She is admitted for further evaluation. Stroke workup completed.  Neurologist recommended to continue with therapeutic anticoagulation and follow-up in 4 to 6 weeks after discharge.  Cardiology wanted to left heart cath once INR is below 2.  She underwent left heart cath which was unremarkable from prior left heart cath.  Cardiologist recommended patient can be discharged,  no need to bridge with heparin.  Patient feels better and  wants to be discharged.  Patient being discharged home.  I reviewed the patient's discharge summary (copied text is above) I reviewed her imaging and her heart cath findings. She continues to take her coumadin daily, has an appointment to check her INR on 05/18/22. It was requested that she get follow up labs today including CBC and BMP.   Patient was ruled out for CVA, her right leg weakness resolved but she did have elevated troponins which is why they took her to [facility]. Her LHC was stable from previous findings, mild plaque but no obstruction. It reported that her stress induced CM was improving.   Current Outpatient Medications  Medication Instructions   acetaminophen (TYLENOL) 1,000 mg, Oral, Every 6 hours  PRN   ascorbic acid (VITAMIN C) 500 mg, Oral, Daily   atorvastatin (LIPITOR) 40 mg, Oral, Daily   cyanocobalamin (VITAMIN B12) 1,000 mcg, Oral, Daily   dapagliflozin propanediol (FARXIGA) 10 mg, Oral, Daily before breakfast   diclofenac Sodium (VOLTAREN) 4 g, Topical, 4 times daily   furosemide (LASIX) 20 mg, Oral, Daily PRN   glucose blood (ONETOUCH VERIO) test strip USE 1 STRIP TO CHECK GLUCOSE ONCE DAILY AS DIRECTED   levothyroxine (SYNTHROID) 25 mcg, Oral, Daily before breakfast   metoprolol tartrate (LOPRESSOR) 12.5 mg, Oral, 2 times daily   nitroGLYCERIN (NITROSTAT) 0.4 mg, Sublingual, Every 5 min PRN   OneTouch Delica Lancets 30G MISC USE 1  TO CHECK GLUCOSE ONCE DAILY   pantoprazole (PROTONIX) 40 mg, Oral, Daily   sacubitril-valsartan (ENTRESTO) 24-26 MG 1 tablet, Oral, 2 times daily   spironolactone (ALDACTONE) 25 MG tablet Take 1/2 (one-half) tablet by mouth once daily   warfarin (COUMADIN) 5 MG tablet TAKE 1-2 TABLETS BY MOUTH ONCE DAILY OR AS DIRECTED  BY  COUMADIN  CLINIC    Patient Active Problem List   Diagnosis Date Noted   Weakness of right lower extremity 04/25/2022   HTN (hypertension) 04/25/2022   Chronic systolic CHF (congestive heart failure) (HCC) 04/25/2022   Lower extremity weakness 04/25/2022   Right sided weakness 04/25/2022   S/P colostomy (HCC) 04/25/2022   NSTEMI (non-ST elevated myocardial infarction) (HCC) 04/25/2022   Elevated troponin    Dilated cardiomyopathy (HCC)    Ischemic cardiomyopathy    PAF (paroxysmal atrial fibrillation) (HCC)    Non-ST elevation (NSTEMI)  myocardial infarction (HCC) 12/25/2020   Thrombocytopenia (HCC) 04/23/2020   CAD (coronary artery disease) 06/24/2019   Hypertension 04/25/2019   Atypical chest pain 12/15/2018   Chronic kidney disease, stage 3a (HCC) 12/15/2018   Frequent falls 05/21/2018   HFimpEF (heart failure with improved ejection fraction) (HCC) 03/15/2017   Anticoagulated on warfarin    Pulmonary  hypertension (HCC) 07/26/2016   Neuropathy 02/12/2015   History of pulmonary embolism 06/10/2013   Hypothyroid 06/06/2013   Diabetes mellitus with neurological manifestations, controlled (HCC) 06/06/2013   Hyperlipidemia 02/25/2013   DM2 (diabetes mellitus, type 2) (HCC) 07/23/2012      Review of Systems  All other systems reviewed and are negative.     Objective:     BP 116/78 (BP Location: Right Arm, Patient Position: Sitting, Cuff Size: Large)   Pulse (!) 105   Temp (!) 97.5 F (36.4 C) (Oral)   Ht 5\' 7"  (1.702 m)   Wt 186 lb 8 oz (84.6 kg)   SpO2 97%   BMI 29.21 kg/m    Physical Exam Constitutional:      Appearance: Normal appearance. She is normal weight.  Cardiovascular:     Rate and Rhythm: Normal rate and regular rhythm.     Heart sounds: Normal heart sounds.  Pulmonary:     Effort: Pulmonary effort is normal.     Breath sounds: Normal breath sounds. No wheezing or rales.  Abdominal:     General: Bowel sounds are normal.  Neurological:     Mental Status: She is alert and oriented to person, place, and time. Mental status is at baseline.  Psychiatric:        Mood and Affect: Mood normal.        Behavior: Behavior normal.      No results found for any visits on 05/13/22.  Last CBC Lab Results  Component Value Date   WBC 8.7 04/30/2022   HGB 13.7 04/30/2022   HCT 45.2 04/30/2022   MCV 86.8 04/30/2022   MCH 26.3 04/30/2022   RDW 19.2 (H) 04/30/2022   PLT 208 04/30/2022   Last metabolic panel Lab Results  Component Value Date   GLUCOSE 91 04/30/2022   NA 137 04/30/2022   K 4.7 04/30/2022   CL 107 04/30/2022   CO2 22 04/30/2022   BUN 27 (H) 04/30/2022   CREATININE 1.41 (H) 04/30/2022   GFRNONAA 38 (L) 04/30/2022   CALCIUM 8.8 (L) 04/30/2022   PHOS 3.1 04/30/2022   PROT 5.9 (L) 04/27/2022   ALBUMIN 2.8 (L) 04/27/2022   BILITOT 1.1 04/27/2022   ALKPHOS 53 04/27/2022   AST 18 04/27/2022   ALT 12 04/27/2022   ANIONGAP 8 04/30/2022       The ASCVD Risk score (Arnett DK, et al., 2019) failed to calculate for the following reasons:   The 2019 ASCVD risk score is only valid for ages 59 to 41   The patient has a prior MI or stroke diagnosis    Assessment & Plan:  Anticoagulated on warfarin -     CBC  Acquired hypothyroidism -     Levothyroxine Sodium; Take 1 tablet (25 mcg total) by mouth daily before breakfast.  Dispense: 90 tablet; Refill: 1  Type 2 diabetes mellitus with diabetic polyneuropathy, without long-term current use of insulin (HCC) -     OneTouch Delica Lancets 30G; USE 1  TO CHECK GLUCOSE ONCE DAILY  Dispense: 100 each; Refill: 3 -     OneTouch Verio; USE 1 STRIP  TO CHECK GLUCOSE ONCE DAILY AS DIRECTED  Dispense: 100 each; Refill: 3  Weakness of right lower extremity Assessment & Plan: S/p hospitalization for stroke rule out. She reports her symptoms have improved but she is still at increased risk of falls. Patient already has a home health nurse and aide, I recommended adding physical therapy for a few weeks to help with strengthening and endurance, reducing fall risk. Pt is agreeable so I have placed the orders.  Orders: -     Ambulatory referral to Home Health  Chronic kidney disease, stage 3a (HCC) -     Basic metabolic panel  TIA (transient ischemic attack) -     Ambulatory referral to Home Health     Return in about 6 months (around 11/13/2022).    [name], MD

## 2022-05-13 NOTE — Assessment & Plan Note (Signed)
S/p hospitalization for stroke rule out. She reports her symptoms have improved but she is still at increased risk of falls. Patient already has a home health nurse and aide, I recommended adding physical therapy for a few weeks to help with strengthening and endurance, reducing fall risk. Pt is agreeable so I have placed the orders.

## 2022-05-13 NOTE — Progress Notes (Signed)
Pt was 26 minutes late for appt and rescheduled, not seen today

## 2022-05-16 ENCOUNTER — Inpatient Hospital Stay (HOSPITAL_COMMUNITY): Admit: 2022-05-16 | Discharge: 2022-05-16 | Disposition: A | Discharge status: Home / Self Care | Payer: Medicare Other

## 2022-05-17 ENCOUNTER — Telehealth: Payer: Self-pay | Admitting: Family Medicine

## 2022-05-17 NOTE — Telephone Encounter (Signed)
Pt called, returning CMA's call. CMA was with a patient Pt asked that CMA call back at her earliest convenience. 

## 2022-05-17 NOTE — Telephone Encounter (Signed)
See results note. 

## 2022-05-18 ENCOUNTER — Ambulatory Visit: Payer: Medicare Other | Attending: Internal Medicine

## 2022-05-18 DIAGNOSIS — Z5181 Encounter for therapeutic drug level monitoring: Secondary | ICD-10-CM | POA: Diagnosis not present

## 2022-05-18 DIAGNOSIS — I2699 Other pulmonary embolism without acute cor pulmonale: Secondary | ICD-10-CM | POA: Diagnosis not present

## 2022-05-18 LAB — POCT INR: INR: 5.2 — AB (ref 2.0–3.0)

## 2022-05-18 NOTE — Patient Instructions (Signed)
Description   HOLD today's dose and tomorrow's dose and then continue taking Warfarin 1/2 tablet daily except for 1 tablet on Wednesdays and Fridays.  Keep a green leafy veggie in your diet 2 times a week.  Recheck INR in 2 weeks.  Coumadin Clinic [phone].

## 2022-05-18 NOTE — Progress Notes (Signed)
Care Management & Coordination Services Pharmacy Note  05/20/2022 Name:  Hannah [name] MRN:  [id number] DOB:  09-May-1940  Summary: BP at goal <140/90 A1C slightly above goal <7  Recommendations/Changes made from today's visit: -Counseled to continue to check BP at home at least once weekly -Counseled to continue to be mindful of sodium intake -Counseled on importance of routine BG monitoring and to keep a log -Reviewed BG goals for various times of day and a low-carb diet  Follow up plan: DM/BP call in 3 months Pharmacist visit in 6-7 months   Subjective: Hannah [name] is an 82 y.o. year old female who is a primary patient of [name], MD.  The care coordination team was consulted for assistance with disease management and care coordination needs.    Engaged with patient by telephone for follow up visit.  Recent office visits: 05/13/22 Nira Conn, MD - For T2DM and hospital f/u. No medication changes  Recent consult visits: 05/04/22 Leary Roca, RN (Anticoag) INR 1.9. Take 1 and 1/2 tabs daily then continue current dose of warfarin  04/13/22 Leary Roca, RN (Anticoag) INR 4.9. Hold warfarin today, then continue current dose  03/16/22 Candance Hemphill, RN (Anticoag) INR 2.4. Take 1.5 tabs of warfarin that day, then continue 1/2 tab daily except for full tab on Wed and Fri  02/09/22 Leary Roca, RN (Anticoag) INR 4.1. Hold warfarin 2/7. Continue Warfarin 2.5mg  daily except for 5mg  on Wed/Fri  Hospital visits: 04/24/22 [facility] - For stroke like symptoms, heart cath performed no medication changes  02/10/22 Kristine Royal, MD - ER at [facility] for fall. No admit, no medication changes   Objective:  Lab Results  Component Value Date   CREATININE 2.21 (H) 05/13/2022   BUN 34 (H) 05/13/2022   GFR 20.47 (L) 05/13/2022   EGFR 42 (L) 04/16/2021   GFRNONAA 38 (L) 04/30/2022   GFRAA 45 (L) 12/03/2019   NA 135 05/13/2022   K 3.8 05/13/2022   CALCIUM  9.2 05/13/2022   CO2 19 05/13/2022   GLUCOSE 118 (H) 05/13/2022    Lab Results  Component Value Date/Time   HGBA1C 7.2 (H) 04/25/2022 05:49 AM   HGBA1C 6.7 (A) 08/19/2021 01:18 PM   HGBA1C 6.3 02/15/2021 01:01 PM   GFR 20.47 (L) 05/13/2022 10:41 AM   GFR 37.65 (L) 02/15/2021 01:01 PM   MICROALBUR 4.3 (H) 09/12/2018 03:07 PM   MICROALBUR 6.2 (H) 04/10/2015 09:01 AM    Last diabetic Eye exam:  Lab Results  Component Value Date/Time   HMDIABEYEEXA No Retinopathy 06/23/2015 12:00 AM    Last diabetic Foot exam: No results found for: "HMDIABFOOTEX"   Lab Results  Component Value Date   CHOL 118 04/25/2022   HDL 49 04/25/2022   LDLCALC 59 04/25/2022   TRIG 51 04/25/2022   CHOLHDL 2.4 04/25/2022       Latest Ref Rng & Units 04/27/2022    2:24 AM 04/26/2022    5:01 AM 04/24/2022   11:21 PM  Hepatic Function  Total Protein 6.5 - 8.1 g/dL 5.9  6.0  7.7   Albumin 3.5 - 5.0 g/dL 2.8  3.0  3.8   AST 15 - 41 U/L 18  20  22    ALT 0 - 44 U/L 12  11  14    Alk Phosphatase 38 - 126 U/L 53  51  69   Total Bilirubin 0.3 - 1.2 mg/dL 1.1  1.1  1.0     Lab Results  Component  Value Date/Time   TSH 2.00 08/19/2021 02:02 PM   TSH 2.93 08/19/2020 03:16 PM   FREET4 1.09 02/25/2013 12:23 PM       Latest Ref Rng & Units 05/13/2022   10:41 AM 04/30/2022    2:26 AM 04/29/2022    3:02 AM  CBC  WBC 4.0 - 10.5 K/uL 7.9  8.7  8.5   Hemoglobin 12.0 - 15.0 g/dL 16.1  09.6  04.5   Hematocrit 36.0 - 46.0 % 47.2  45.2  44.2   Platelets 150.0 - 400.0 K/uL 220.0  208  227     Lab Results  Component Value Date/Time   VITAMINB12 1,096 (H) 08/19/2020 03:16 PM   VITAMINB12 312 04/26/2020 06:16 AM    Clinical ASCVD: Yes  The ASCVD Risk score (Arnett DK, et al., 2019) failed to calculate for the following reasons:   The 2019 ASCVD risk score is only valid for ages 24 to 6   The patient has a prior MI or stroke diagnosis        09/13/2021    2:34 PM 06/24/2021    3:26 PM 01/13/2021   11:33 AM   Depression screen PHQ 2/9  Decreased Interest 0 0 0  Down, Depressed, Hopeless 0 0 0  PHQ - 2 Score 0 0 0  Altered sleeping   0  Tired, decreased energy   0  Change in appetite   0  Feeling bad or failure about yourself    3  Suicidal thoughts   0  PHQ-9 Score   3     Social History   Tobacco Use  Smoking Status Former   Packs/day: 1.00   Years: 10.00   Additional pack years: 0.00   Total pack years: 10.00   Types: Cigarettes   Quit date: 01/04/1968   Years since quitting: 54.4  Smokeless Tobacco Never   BP Readings from Last 3 Encounters:  05/13/22 116/78  04/30/22 126/73  02/10/22 (!) 139/57   Pulse Readings from Last 3 Encounters:  05/13/22 (!) 105  04/30/22 71  02/10/22 (!) 52   Wt Readings from Last 3 Encounters:  05/13/22 186 lb 8 oz (84.6 kg)  04/29/22 196 lb 8 oz (89.1 kg)  12/15/21 198 lb 12.8 oz (90.2 kg)   BMI Readings from Last 3 Encounters:  05/13/22 29.21 kg/m  04/29/22 30.78 kg/m  12/15/21 31.14 kg/m    Allergies  Allergen Reactions   Augmentin [Amoxicillin-Pot Clavulanate] Itching and Other (See Comments)    Severe vaginal itching   Tranxene [Clorazepate] Itching   Crestor [Rosuvastatin]     Made her sick on stomach, she is able to tolerate the zocor   Penicillins Itching and Rash    Medications Reviewed Today     Reviewed by Johnella Moloney, CMA (Certified Medical Assistant) on 05/13/22 at 0950  Med List Status: <None>   Medication Order Taking? Sig Documenting Provider Last Dose Status Informant  acetaminophen (TYLENOL) 500 MG tablet [id number] Yes Take 1,000 mg by mouth every 6 (six) hours as needed for mild pain. [provider] Taking Active Multiple Informants  atorvastatin (LIPITOR) 40 MG tablet [id number] Yes Take 1 tablet (40 mg total) by mouth daily. [name], MD Taking Active Multiple Informants  dapagliflozin propanediol (FARXIGA) 10 MG TABS tablet [id number] Yes Take 1 tablet (10 mg total) by mouth daily  before breakfast. Pricilla Riffle, MD Taking Active Multiple Informants  diclofenac Sodium (VOLTAREN) 1 % GEL [id number] Yes Apply 4 g  topically 4 (four) times daily.  Patient taking differently: Apply 4 g topically 4 (four) times daily as needed (for pain- to affected sites).   Merrilee Jansky, MD Taking Active Multiple Informants  furosemide (LASIX) 20 MG tablet [id number] Yes Take 1 tablet (20 mg total) by mouth daily as needed for fluid or edema. Wynn Banker, MD Taking Active Multiple Informants  glucose blood (ONETOUCH VERIO) test strip [id number] Yes USE 1 STRIP TO CHECK GLUCOSE ONCE DAILY AS DIRECTED Wynn Banker, MD Taking Active Multiple Informants  levothyroxine (SYNTHROID) 25 MCG tablet [id number] Yes TAKE 1 TABLET BY MOUTH ONCE DAILY BEFORE BREAKFAST [name], MD Taking Active Multiple Informants  metoprolol tartrate (LOPRESSOR) 25 MG tablet [id number] Yes Take 0.5 tablets (12.5 mg total) by mouth 2 (two) times daily. Willeen Niece, MD Taking Active   nitroGLYCERIN (NITROSTAT) 0.4 MG SL tablet [id number] Yes Place 1 tablet (0.4 mg total) under the tongue every 5 (five) minutes as needed for chest pain. Willeen Niece, MD Taking Active   OneTouch Delica Lancets 30G [place] [id number] Yes USE 1  TO CHECK GLUCOSE ONCE DAILY Koberlein, Paris Lore, MD Taking Active Multiple Informants  pantoprazole (PROTONIX) 40 MG tablet [id number] Yes Take 1 tablet (40 mg total) by mouth daily.  Patient taking differently: Take 40 mg by mouth daily before breakfast.   Wynn Banker, MD Taking Active Multiple Informants  sacubitril-valsartan (ENTRESTO) 24-26 MG [id number] Yes Take 1 tablet by mouth 2 (two) times daily. [name], MD Taking Active Multiple Informants  spironolactone (ALDACTONE) 25 MG tablet [id number] Yes Take 1/2 (one-half) tablet by mouth once daily  Patient taking differently: Take 12.5 mg by mouth daily.   Pricilla Riffle, MD Taking Active Multiple Informants   vitamin B-12 (CYANOCOBALAMIN) 1000 MCG tablet [id number] Yes Take 1 tablet (1,000 mcg total) by mouth daily. Wynn Banker, MD Taking Active Multiple Informants  vitamin C (ASCORBIC ACID) 500 MG tablet [id number] Yes Take 500 mg by mouth daily.  [provider] Taking Active Multiple Informants  warfarin (COUMADIN) 5 MG tablet [id number] Yes TAKE 1-2 TABLETS BY MOUTH ONCE DAILY OR AS DIRECTED  BY  COUMADIN  CLINIC  Patient taking differently: Take 2.5-5 mg by mouth See admin instructions. Take 2.5 mg by mouth at bedtime on Sun/Mon/Tues/Thurs/ Sat and 5 mg on Wed/Fri   Pricilla Riffle, MD Taking Active Multiple Informants            SDOH:  (Social Determinants of Health) assessments and interventions performed: Yes SDOH Interventions    Flowsheet Row Care Coordination from 05/20/2022 in [facility] Telephone from 05/02/2022 in [facility] Coordination ED to Hosp-Admission (Discharged) from 04/24/2022 in [facility] Care Coordination from 02/18/2022 in [facility] Care Management from 12/13/2021 in [facility] [place] Telephone from 09/13/2021 in [facility] Coordination  SDOH Interventions        Food Insecurity Interventions Intervention Not Indicated Intervention Not Indicated -- Intervention Not Indicated -- Intervention Not Indicated  Housing Interventions Intervention Not Indicated -- Intervention Not Indicated Intervention Not Indicated -- Intervention Not Indicated  Transportation Interventions -- Intervention Not Indicated Intervention Not Indicated Intervention Not Indicated -- Ambulatory [id number] Order  Utilities Interventions -- -- -- Intervention Not Indicated -- --  Alcohol Usage Interventions -- -- Intervention Not Indicated (Score <7) -- -- --  Financial Strain Interventions -- -- Intervention Not Indicated -- Intervention Not Indicated --  Medication Assistance:  [name] and Marcelline Deist approved through PAP for 2024  Medication Access: Within the past 30 days, how often has patient missed a dose of medication? None (but no atorvastatin in last 30 days due to lack of refills, pt did not request) Is a pillbox or other method used to improve adherence? Yes  Factors that may affect medication adherence? no barriers identified Are meds synced by current pharmacy? No  Are meds delivered by current pharmacy? No  Does patient experience delays in picking up medications due to transportation concerns? No   [facility] Reviewed: Is patient disadvantaged to use [facility]?: Yes  Current Rx insurance plan: [facility] Name and location of Current pharmacy:  [id number] - [place] - [address] [place] [address] Phone: [phone] Fax: [phone]  [facility] services reviewed with patient today?: No  Patient requests to transfer care to [facility]?: No  Reason patient declined to change pharmacies: Not mentioned at this visit  Compliance/Adherence/Medication fill history: Care Gaps: Diabetic eye exam Urine microalbumin Vaccines: Flu/Covid  Star-Rating Drugs: Atorvastatin 40mg  PDC 2% Entresto 24-26mg  PDC 45% - getting through PAP Farxiga 10mg  PDC 0% - getting through PAP   Assessment/Plan Hyperlipidemia: (LDL goal < 70) -Controlled -Current treatment: Atorvastatin 40mg  1 qd Appropriate, Effective, Safe, Accessible -Medications previously tried: Simvastatin  -Current dietary patterns: not discussed -Current exercise habits: does PT exercises nearly everyday -Educated on Benefits of statin for ASCVD risk reduction; Importance of limiting foods high in cholesterol; Exercise goal of 150 minutes per week; -  Hypertension (BP goal <140/90) -Controlled -Current treatment: Lasix 20mg  1 qd prn edema Appropriate, Effective, Safe,  Accessible Metoprolol tartrate 25mg  1/2 BID Appropriate, Effective, Safe, Accessible Entresto 24-26mg  1 BID Appropriate, Effective, Safe, Accessible Spironolactone 25mg  1/2 qd Appropriate, Effective, Safe, Accessible -Medications previously tried: Amlodipine, Lisinopril -Current home readings: checking at least once a week -Current dietary habits: does not add salt to any food and is very mindful of sodium intake -Current exercise habits: not discussed -Denies hypotensive/hypertensive symptoms -Educated on BP goals and benefits of medications for prevention of heart attack, stroke and kidney damage; Daily salt intake goal < 2300 mg; Importance of home blood pressure monitoring; Proper BP monitoring technique; -Counseled to monitor BP at home once weekly, document, and provide log at future appointments -Recommended to continue current medication  Diabetes (A1c goal <7%) -Uncontrolled -Current medications: Farxiga 10mg  through PAP Appropriate, Effective, Safe, Accessible -Medications previously tried: Glipizide, Metformin -Current home glucose readings Checks sugars once every other day at various times, No log, insists very strongly that they are always good. Denies any readings above 200 or below 70. -Denies hypoglycemic/hyperglycemic symptoms -Current meal patterns:  Not discussed -Current exercise: see above -Educated on A1c and blood sugar goals; Exercise goal of 150 minutes per week; Prevention and management of hypoglycemic episodes; -Counseled to check feet daily and get yearly eye exams -Recommended to continue current medication    Sherrill Raring Clinical Pharmacist [phone]

## 2022-05-18 NOTE — Addendum Note (Signed)
Addended by: [name] on: 05/18/2022 11:12 AM   Modules accepted: Orders

## 2022-05-19 ENCOUNTER — Telehealth: Payer: Self-pay

## 2022-05-19 NOTE — Progress Notes (Signed)
Care Management & Coordination Services Pharmacy Team  Reason for Encounter: Appointment Reminder  Contacted patient to confirm telephone appointment with Delano Metz, PharmD on 05/20/2022 at 1:00. Unsuccessful outreach. Left voicemail for patient to return call.  Do you have any problems getting your medications?  If yes what types of problems are you experiencing?   What is your top health concern you would like to discuss at your upcoming visit?   Have you seen any other providers since your last visit with PCP?   Care Gaps: AWV - completed 06/24/2021 Last eye exam - completed 04/04/2018 Last foot exam - completed 06/10/2021 Last BP - 116/78 on 05/13/2022 Last A1C - 7.2 on 04/25/2022 Urine ACR - overdue Covid - overdue Shingrix - postponed   Star Rating Drugs:  Atorvastatin 40 mg - last filled 03/03/2022 90 DS at [facility] Farxiga 10 mg - filled by PAP through 01/03/2023   [name] CMA  Clinical Pharmacist Assistant [phone]

## 2022-05-20 ENCOUNTER — Telehealth: Payer: Self-pay | Admitting: Family Medicine

## 2022-05-20 ENCOUNTER — Ambulatory Visit: Payer: Medicare Other

## 2022-05-20 DIAGNOSIS — E1122 Type 2 diabetes mellitus with diabetic chronic kidney disease: Secondary | ICD-10-CM | POA: Diagnosis not present

## 2022-05-20 DIAGNOSIS — I130 Hypertensive heart and chronic kidney disease with heart failure and stage 1 through stage 4 chronic kidney disease, or unspecified chronic kidney disease: Secondary | ICD-10-CM | POA: Diagnosis not present

## 2022-05-20 DIAGNOSIS — N1831 Chronic kidney disease, stage 3a: Secondary | ICD-10-CM | POA: Diagnosis not present

## 2022-05-20 DIAGNOSIS — I214 Non-ST elevation (NSTEMI) myocardial infarction: Secondary | ICD-10-CM | POA: Diagnosis not present

## 2022-05-20 DIAGNOSIS — I5022 Chronic systolic (congestive) heart failure: Secondary | ICD-10-CM | POA: Diagnosis not present

## 2022-05-20 DIAGNOSIS — I480 Paroxysmal atrial fibrillation: Secondary | ICD-10-CM | POA: Diagnosis not present

## 2022-05-20 NOTE — Telephone Encounter (Signed)
Monique informed of Ok

## 2022-05-20 NOTE — Telephone Encounter (Signed)
Ok to send

## 2022-05-20 NOTE — Telephone Encounter (Signed)
Home PT 1x1/2x3/1x3 for strengthening, safe transfer, gait balance training, home exercise program and safety fall prescutions

## 2022-05-24 DIAGNOSIS — N1831 Chronic kidney disease, stage 3a: Secondary | ICD-10-CM | POA: Diagnosis not present

## 2022-05-24 DIAGNOSIS — E119 Type 2 diabetes mellitus without complications: Secondary | ICD-10-CM | POA: Diagnosis not present

## 2022-05-24 DIAGNOSIS — E1122 Type 2 diabetes mellitus with diabetic chronic kidney disease: Secondary | ICD-10-CM | POA: Diagnosis not present

## 2022-05-24 DIAGNOSIS — Z4801 Encounter for change or removal of surgical wound dressing: Secondary | ICD-10-CM | POA: Diagnosis not present

## 2022-05-24 DIAGNOSIS — I214 Non-ST elevation (NSTEMI) myocardial infarction: Secondary | ICD-10-CM | POA: Diagnosis not present

## 2022-05-24 DIAGNOSIS — K56609 Unspecified intestinal obstruction, unspecified as to partial versus complete obstruction: Secondary | ICD-10-CM | POA: Diagnosis not present

## 2022-05-24 DIAGNOSIS — I5022 Chronic systolic (congestive) heart failure: Secondary | ICD-10-CM | POA: Diagnosis not present

## 2022-05-24 DIAGNOSIS — I480 Paroxysmal atrial fibrillation: Secondary | ICD-10-CM | POA: Diagnosis not present

## 2022-05-24 DIAGNOSIS — I130 Hypertensive heart and chronic kidney disease with heart failure and stage 1 through stage 4 chronic kidney disease, or unspecified chronic kidney disease: Secondary | ICD-10-CM | POA: Diagnosis not present

## 2022-05-24 DIAGNOSIS — Z933 Colostomy status: Secondary | ICD-10-CM | POA: Diagnosis not present

## 2022-05-25 DIAGNOSIS — K56609 Unspecified intestinal obstruction, unspecified as to partial versus complete obstruction: Secondary | ICD-10-CM | POA: Diagnosis not present

## 2022-05-25 DIAGNOSIS — E119 Type 2 diabetes mellitus without complications: Secondary | ICD-10-CM | POA: Diagnosis not present

## 2022-05-25 DIAGNOSIS — Z933 Colostomy status: Secondary | ICD-10-CM | POA: Diagnosis not present

## 2022-05-25 DIAGNOSIS — Z4801 Encounter for change or removal of surgical wound dressing: Secondary | ICD-10-CM | POA: Diagnosis not present

## 2022-05-26 DIAGNOSIS — E1122 Type 2 diabetes mellitus with diabetic chronic kidney disease: Secondary | ICD-10-CM | POA: Diagnosis not present

## 2022-05-26 DIAGNOSIS — I5022 Chronic systolic (congestive) heart failure: Secondary | ICD-10-CM | POA: Diagnosis not present

## 2022-05-26 DIAGNOSIS — I480 Paroxysmal atrial fibrillation: Secondary | ICD-10-CM | POA: Diagnosis not present

## 2022-05-26 DIAGNOSIS — I130 Hypertensive heart and chronic kidney disease with heart failure and stage 1 through stage 4 chronic kidney disease, or unspecified chronic kidney disease: Secondary | ICD-10-CM | POA: Diagnosis not present

## 2022-05-26 DIAGNOSIS — I214 Non-ST elevation (NSTEMI) myocardial infarction: Secondary | ICD-10-CM | POA: Diagnosis not present

## 2022-05-26 DIAGNOSIS — N1831 Chronic kidney disease, stage 3a: Secondary | ICD-10-CM | POA: Diagnosis not present

## 2022-05-31 DIAGNOSIS — I214 Non-ST elevation (NSTEMI) myocardial infarction: Secondary | ICD-10-CM | POA: Diagnosis not present

## 2022-05-31 DIAGNOSIS — E1122 Type 2 diabetes mellitus with diabetic chronic kidney disease: Secondary | ICD-10-CM | POA: Diagnosis not present

## 2022-05-31 DIAGNOSIS — I480 Paroxysmal atrial fibrillation: Secondary | ICD-10-CM | POA: Diagnosis not present

## 2022-05-31 DIAGNOSIS — N1831 Chronic kidney disease, stage 3a: Secondary | ICD-10-CM | POA: Diagnosis not present

## 2022-05-31 DIAGNOSIS — I5022 Chronic systolic (congestive) heart failure: Secondary | ICD-10-CM | POA: Diagnosis not present

## 2022-05-31 DIAGNOSIS — I130 Hypertensive heart and chronic kidney disease with heart failure and stage 1 through stage 4 chronic kidney disease, or unspecified chronic kidney disease: Secondary | ICD-10-CM | POA: Diagnosis not present

## 2022-06-01 ENCOUNTER — Ambulatory Visit: Payer: Medicare Other | Attending: Cardiology

## 2022-06-01 DIAGNOSIS — I2699 Other pulmonary embolism without acute cor pulmonale: Secondary | ICD-10-CM | POA: Diagnosis not present

## 2022-06-01 DIAGNOSIS — Z5181 Encounter for therapeutic drug level monitoring: Secondary | ICD-10-CM | POA: Diagnosis not present

## 2022-06-01 LAB — POCT INR: INR: 4.2 — AB (ref 2.0–3.0)

## 2022-06-01 NOTE — Patient Instructions (Signed)
Description   HOLD today's dosage of Warfarin, then start taking Warfarin 1/2 tablet daily except for 1 tablet on Fridays.  Keep a green leafy veggie in your diet 2 times a week.  Recheck INR in 2 weeks.  [facility] [phone].

## 2022-06-02 DIAGNOSIS — I480 Paroxysmal atrial fibrillation: Secondary | ICD-10-CM | POA: Diagnosis not present

## 2022-06-02 DIAGNOSIS — E1122 Type 2 diabetes mellitus with diabetic chronic kidney disease: Secondary | ICD-10-CM | POA: Diagnosis not present

## 2022-06-02 DIAGNOSIS — I214 Non-ST elevation (NSTEMI) myocardial infarction: Secondary | ICD-10-CM | POA: Diagnosis not present

## 2022-06-02 DIAGNOSIS — I5022 Chronic systolic (congestive) heart failure: Secondary | ICD-10-CM | POA: Diagnosis not present

## 2022-06-02 DIAGNOSIS — N1831 Chronic kidney disease, stage 3a: Secondary | ICD-10-CM | POA: Diagnosis not present

## 2022-06-02 DIAGNOSIS — I130 Hypertensive heart and chronic kidney disease with heart failure and stage 1 through stage 4 chronic kidney disease, or unspecified chronic kidney disease: Secondary | ICD-10-CM | POA: Diagnosis not present

## 2022-06-07 DIAGNOSIS — I214 Non-ST elevation (NSTEMI) myocardial infarction: Secondary | ICD-10-CM | POA: Diagnosis not present

## 2022-06-07 DIAGNOSIS — I130 Hypertensive heart and chronic kidney disease with heart failure and stage 1 through stage 4 chronic kidney disease, or unspecified chronic kidney disease: Secondary | ICD-10-CM | POA: Diagnosis not present

## 2022-06-07 DIAGNOSIS — I480 Paroxysmal atrial fibrillation: Secondary | ICD-10-CM | POA: Diagnosis not present

## 2022-06-07 DIAGNOSIS — N1831 Chronic kidney disease, stage 3a: Secondary | ICD-10-CM | POA: Diagnosis not present

## 2022-06-07 DIAGNOSIS — E1122 Type 2 diabetes mellitus with diabetic chronic kidney disease: Secondary | ICD-10-CM | POA: Diagnosis not present

## 2022-06-07 DIAGNOSIS — I5022 Chronic systolic (congestive) heart failure: Secondary | ICD-10-CM | POA: Diagnosis not present

## 2022-06-08 ENCOUNTER — Ambulatory Visit (HOSPITAL_COMMUNITY)
Admission: RE | Admit: 2022-06-08 | Discharge: 2022-06-08 | Disposition: A | Discharge status: Home / Self Care | Payer: Medicare Other | Source: Ambulatory Visit | Attending: Adult Health | Admitting: Adult Health

## 2022-06-08 ENCOUNTER — Encounter (HOSPITAL_COMMUNITY): Payer: Self-pay

## 2022-06-08 VITALS — BP 150/110 | HR 78 | Wt 186.0 lb

## 2022-06-08 DIAGNOSIS — I428 Other cardiomyopathies: Secondary | ICD-10-CM | POA: Insufficient documentation

## 2022-06-08 DIAGNOSIS — R0609 Other forms of dyspnea: Secondary | ICD-10-CM | POA: Diagnosis not present

## 2022-06-08 DIAGNOSIS — E785 Hyperlipidemia, unspecified: Secondary | ICD-10-CM | POA: Insufficient documentation

## 2022-06-08 DIAGNOSIS — E039 Hypothyroidism, unspecified: Secondary | ICD-10-CM | POA: Diagnosis not present

## 2022-06-08 DIAGNOSIS — I130 Hypertensive heart and chronic kidney disease with heart failure and stage 1 through stage 4 chronic kidney disease, or unspecified chronic kidney disease: Secondary | ICD-10-CM | POA: Insufficient documentation

## 2022-06-08 DIAGNOSIS — Z933 Colostomy status: Secondary | ICD-10-CM | POA: Diagnosis not present

## 2022-06-08 DIAGNOSIS — I10 Essential (primary) hypertension: Secondary | ICD-10-CM | POA: Diagnosis not present

## 2022-06-08 DIAGNOSIS — Z79899 Other long term (current) drug therapy: Secondary | ICD-10-CM | POA: Insufficient documentation

## 2022-06-08 DIAGNOSIS — I5042 Chronic combined systolic (congestive) and diastolic (congestive) heart failure: Secondary | ICD-10-CM | POA: Diagnosis not present

## 2022-06-08 DIAGNOSIS — I5022 Chronic systolic (congestive) heart failure: Secondary | ICD-10-CM

## 2022-06-08 DIAGNOSIS — E1122 Type 2 diabetes mellitus with diabetic chronic kidney disease: Secondary | ICD-10-CM | POA: Insufficient documentation

## 2022-06-08 DIAGNOSIS — I420 Dilated cardiomyopathy: Secondary | ICD-10-CM

## 2022-06-08 DIAGNOSIS — N179 Acute kidney failure, unspecified: Secondary | ICD-10-CM | POA: Insufficient documentation

## 2022-06-08 DIAGNOSIS — N1832 Chronic kidney disease, stage 3b: Secondary | ICD-10-CM | POA: Insufficient documentation

## 2022-06-08 DIAGNOSIS — Z7901 Long term (current) use of anticoagulants: Secondary | ICD-10-CM | POA: Diagnosis not present

## 2022-06-08 DIAGNOSIS — I480 Paroxysmal atrial fibrillation: Secondary | ICD-10-CM | POA: Diagnosis not present

## 2022-06-08 DIAGNOSIS — I2510 Atherosclerotic heart disease of native coronary artery without angina pectoris: Secondary | ICD-10-CM | POA: Insufficient documentation

## 2022-06-08 DIAGNOSIS — I340 Nonrheumatic mitral (valve) insufficiency: Secondary | ICD-10-CM | POA: Diagnosis not present

## 2022-06-08 DIAGNOSIS — Z602 Problems related to living alone: Secondary | ICD-10-CM | POA: Insufficient documentation

## 2022-06-08 LAB — BASIC METABOLIC PANEL
Anion gap: 10 (ref 5–15)
BUN: 12 mg/dL (ref 8–23)
CO2: 20 mmol/L — ABNORMAL LOW (ref 22–32)
Calcium: 8.7 mg/dL — ABNORMAL LOW (ref 8.9–10.3)
Chloride: 106 mmol/L (ref 98–111)
Creatinine, Ser: 1.24 mg/dL — ABNORMAL HIGH (ref 0.44–1.00)
GFR, Estimated: 44 mL/min — ABNORMAL LOW (ref >60)
Glucose, Bld: 118 mg/dL — ABNORMAL HIGH (ref 70–99)
Potassium: 4 mmol/L (ref 3.5–5.1)
Sodium: 136 mmol/L (ref 135–145)

## 2022-06-08 MED ORDER — METOPROLOL SUCCINATE ER 25 MG PO TB24: 25.0000 mg | ORAL_TABLET | Freq: Every day | ORAL | 11 refills | Status: DC

## 2022-06-08 NOTE — Patient Instructions (Signed)
EKG done today.  Labs done today. We will contact you only if your labs are abnormal.  Stop taking Lopressor  START Toprol XL 25mg  (1 tablet) by mouth daily.   No other medication changes were made. Please continue all current medications as prescribed.  Your physician recommends that you schedule a follow-up appointment in: 2 weeks  If you have any questions or concerns before your next appointment please send [place] a message through [place] or call our office at [phone].    TO LEAVE A MESSAGE FOR THE NURSE SELECT OPTION 2, PLEASE LEAVE A MESSAGE INCLUDING: YOUR NAME DATE OF BIRTH CALL BACK NUMBER REASON FOR CALL**this is important as we prioritize the call backs  YOU WILL RECEIVE A CALL BACK THE SAME DAY AS LONG AS YOU CALL BEFORE 4:00 PM   Do the following things EVERYDAY: Weigh yourself in the morning before breakfast. Write it down and keep it in a log. Take your medicines as prescribed Eat low salt foods--Limit salt (sodium) to 2000 mg per day.  Stay as active as you can everyday Limit all fluids for the day to less than 2 liters   At the [facility], you and your health needs are our priority. As part of our continuing mission to provide you with exceptional heart care, we have created designated Provider Care Teams. These Care Teams include your primary Cardiologist (physician) and Advanced Practice Providers (APPs- Physician Assistants and Nurse Practitioners) who all work together to provide you with the care you need, when you need it.   You may see any of the following providers on your designated Care Team at your next follow up: Dr Arvilla Meres Dr Marca Ancona Dr. Marcos Eke, NP Robbie Lis, [place] [facility] [place], [place] Brynda Peon, NP Karle Plumber, PharmD   Please be sure to bring in all your medications bottles to every appointment.    Thank you for choosing [facility]

## 2022-06-08 NOTE — Addendum Note (Signed)
Encounter addended by: Chinita Pester, CMA on: 06/08/2022 4:02 PM  Actions taken: Clinical Note Signed

## 2022-06-08 NOTE — Progress Notes (Signed)
HEART & VASCULAR TRANSITION OF CARE CONSULT NOTE     Referring Physician: Dr Cristal Deer Primary Care: Dr [name] Primary Cardiologist: Dr Tenny Craw INR : Followed at [facility].    HPI: Referred to clinic by Dr Cristal Deer for heart failure consultation.   Ms Ingargiola is a 82 year old with a history of HLD, HTN, CAD, PE, DVT, PAF, DMII, hypothyroidism, colostomy due to colon stricture, and chronic HFrEF.   In 2023 EF had improved to 60-65% from previous EF 30-35% in 2022.   Admitted 04/24/22 with RLE weakness. Stroke team consulted. CT head negative for acute findings. Work up unrevealing. Echo completed LVEF 30% + WMA, possible takotusubo, and RV normal. Had cath with minimal disease. Discharged 04/30/22 on lopressor, entresto, farxiga, and spironolactone.   Had lab work at PCP 05/13/22. Creatinine was up from 1.4-->2.2. Referred to Nephrology.   She presents with her son today. Overall feeling fine. Uses a walker. Get tired walking outside of house.  Having some dyspnea with exertion.  Denies PND/Orthopnea. Appetite ok. No fever or chills. Weight at home  pounds. Taking all medications.  Out of entresto. She does not have spironolactone. She has a caregiver 4 days a week. Also followed by home health. Lives alone. She has 7 grandchildren and 3 children.    Cardiac Testing Echo  04/2022  Left ventricular ejection fraction, by estimation, is 30%. The left  ventricle has moderately decreased function. The left ventricle  demonstrates regional wall motion abnormalities with hyperkinetic basal LV  segments and akinesis of the mid to apical  anteroseptal, mid-apical inferoseptal, apical lateral, apical anterior,  and mid-apical inferior walls as well as the true apex. With hyperdynamic  basal LV contraction, there is mitral valve systolic anterior motion with  LV outflow tract gradient, peak 45   mmHg. These findings are consistent with large wrap-around LAD territory  MI or a  stress (Takotsubo-type) cardiomyopathy. There is mild left  ventricular hypertrophy. Left ventricular diastolic parameters are  consistent with Grade I diastolic dysfunction  (impaired relaxation).   2. Right ventricular systolic function is normal. The right ventricular  size is normal. There is normal pulmonary artery systolic pressure. The  estimated right ventricular systolic pressure is 30.2 mmHg.   3. There was systolic anterior motion of the anterior mitral leaflet. The  mitral valve is abnormal. Mild to moderate mitral valve regurgitation. No  evidence of mitral stenosis.   4. The aortic valve is tricuspid. There is mild calcification of the  aortic valve. Aortic valve regurgitation is not visualized. No aortic stenosis is present.   Echo 2023 EF ~ 60%.  Echo 12/2020 EF ~ 30%  Echo 2020 EF 55-60%    LHC 04/2022  Prox RCA to Mid RCA lesion is 20% stenosed  1.  Minimal obstructive coronary artery disease relatively unchanged versus prior study. 2.  LVEDP of 8 mmHg and ventriculography consistent with recovering Takotsubo cardiomyopathy. 3.  Occlusion of the right radial artery in the upper forearm.  LHC 2022    Prox RCA to Mid RCA lesion is 20% stenosed.  Mild plaque in the mid segment of the large, dominant RCA No obstructive disease in the LAD or Circumflex Normal LV filling pressure.   Review of Systems: [y] = yes, [ ]  = no   General: Weight gain [ ] ; Weight loss [ ] ; Anorexia [ ] ; Fatigue [ ] ; Fever [ ] ; Chills [ ] ; Weakness [ ]   Cardiac: Chest pain/pressure [ ] ; Resting  SOB [ ] ; Exertional SOB [ Y]; Orthopnea [ ] ; Pedal Edema [ Y]; Palpitations [ ] ; Syncope [ ] ; Presyncope [ ] ; Paroxysmal nocturnal dyspnea[ ]   Pulmonary: Cough [ ] ; Wheezing[ ] ; Hemoptysis[ ] ; Sputum [ ] ; Snoring [ ]   GI: Vomiting[ ] ; Dysphagia[ ] ; Melena[ ] ; Hematochezia [ ] ; Heartburn[ ] ; Abdominal pain [ ] ; Constipation [ ] ; Diarrhea [ ] ; BRBPR [ ]   GU: Hematuria[ ] ; Dysuria [ ] ; Nocturia[ ]    Vascular: Pain in legs with walking [ ] ; Pain in feet with lying flat [ ] ; Non-healing sores [ ] ; Stroke [ ] ; TIA [ ] ; Slurred speech [ ] ;  Neuro: Headaches[ ] ; Vertigo[ ] ; Seizures[ ] ; Paresthesias[ ] ;Blurred vision [ ] ; Diplopia [ ] ; Vision changes [ ]   Ortho/Skin: Arthritis [ ] ; Joint pain [ Y]; Muscle pain [ ] ; Joint swelling [ ] ; Back Pain [ Y]; Rash [ ]   Psych: Depression[ ] ; Anxiety[ ]   Heme: Bleeding problems [ ] ; Clotting disorders [ ] ; Anemia [ ]   Endocrine: Diabetes [Y ]; Thyroid dysfunction[Y ]   Past Medical History:  Diagnosis Date   Acute massive pulmonary embolism (HCC) 07/13/2012   Massive PE w/ PEA arrest 07/13/12 >TNK >IVC filter >discharged on comadin     Anticoagulated on warfarin    Chronic diastolic CHF (congestive heart failure) (HCC) 03/15/2017   Chronic kidney disease, stage 3a (HCC) 12/15/2018   Diabetes mellitus, type 2 (HCC) 07/15/2012   with peripheral neuropathy   DM2 (diabetes mellitus, type 2) (HCC) 07/23/2012   HFrEF (heart failure with reduced ejection fraction) (HCC) 03/15/2017   Non-ischemic cardiomyopathy // Cardiac catheterization 01/01/21:  RCA prox to mid 20 // Echocardiogram 12/26/20:  EF 30-35, ant septum/apex, mid inf-sept AK; ant-lat/ant/inf HK, Gr 1 DD, mild LVH, normal RVSF, RVSP 27.2, small effusion (ant to RV), mild MR, mod TR, AV sclerosis w/o AS   Hyperlipemia 07/14/2012   Hyperlipidemia 02/25/2013   Hypertension 07/14/2012   Hypothyroid 06/06/2013   Large bowel stricture (HCC)    s/p colectomy in 2016   Neuropathy 02/12/2015   Osteoarthritis    s/p hip and knee replacements   PAF (paroxysmal atrial fibrillation) (HCC)    Pulmonary hypertension (HCC) 07/26/2016   Syncope 10/2018   Thyroid disease     Current Outpatient Medications  Medication Sig Dispense Refill   acetaminophen (TYLENOL) 500 MG tablet Take 1,000 mg by mouth every 6 (six) hours as needed for mild pain.     atorvastatin (LIPITOR) 40 MG tablet Take 1 tablet (40 mg total) by  mouth daily. 90 tablet 1   dapagliflozin propanediol (FARXIGA) 10 MG TABS tablet Take 1 tablet (10 mg total) by mouth daily before breakfast. 90 tablet 3   diclofenac Sodium (VOLTAREN) 1 % GEL Apply 4 g topically 4 (four) times daily. (Patient taking differently: Apply 4 g topically 4 (four) times daily as needed (for pain- to affected sites).)     furosemide (LASIX) 20 MG tablet Take 1 tablet (20 mg total) by mouth daily as needed for fluid or edema. 90 tablet 1   glucose blood (ONETOUCH VERIO) test strip USE 1 STRIP TO CHECK GLUCOSE ONCE DAILY AS DIRECTED 100 each 3   metoprolol tartrate (LOPRESSOR) 25 MG tablet Take 0.5 tablets (12.5 mg total) by mouth 2 (two) times daily. 60 tablet 1   nitroGLYCERIN (NITROSTAT) 0.4 MG SL tablet Place 1 tablet (0.4 mg total) under the tongue every 5 (five) minutes as needed for chest pain. 30 tablet 3   OneTouch  Delica Lancets 30G MISC USE 1  TO CHECK GLUCOSE ONCE DAILY 100 each 3   pantoprazole (PROTONIX) 40 MG tablet Take 1 tablet (40 mg total) by mouth daily. (Patient taking differently: Take 40 mg by mouth daily before breakfast.) 90 tablet 3   sacubitril-valsartan (ENTRESTO) 24-26 MG Take 1 tablet by mouth 2 (two) times daily. 180 tablet 3   vitamin B-12 (CYANOCOBALAMIN) 1000 MCG tablet Take 1 tablet (1,000 mcg total) by mouth daily.     vitamin C (ASCORBIC ACID) 500 MG tablet Take 500 mg by mouth daily.      warfarin (COUMADIN) 5 MG tablet TAKE 1-2 TABLETS BY MOUTH ONCE DAILY OR AS DIRECTED  BY  COUMADIN  CLINIC (Patient taking differently: Take 2.5-5 mg by mouth See admin instructions. Take 2.5 mg by mouth at bedtime on Sun/Mon/Tues/Thurs/ Sat and 5 mg on Wed/Fri) 80 tablet 1   No current facility-administered medications for this encounter.    Allergies  Allergen Reactions   Augmentin [Amoxicillin-Pot Clavulanate] Itching and Other (See Comments)    Severe vaginal itching   Tranxene [Clorazepate] Itching   Crestor [Rosuvastatin]     Made her sick on  stomach, she is able to tolerate the zocor   Penicillins Itching and Rash      Social History   Socioeconomic History   Marital status: Widowed    Spouse name: Not on file   Number of children: 3   Years of education: Not on file   Highest education level: Not on file  Occupational History   Occupation: Retired  Tobacco Use   Smoking status: Former    Packs/day: 1.00    Years: 10.00    Additional pack years: 0.00    Total pack years: 10.00    Types: Cigarettes    Quit date: 01/04/1968    Years since quitting: 54.4   Smokeless tobacco: Never  Vaping Use   Vaping Use: Never used  Substance and Sexual Activity   Alcohol use: No   Drug use: No   Sexual activity: Not on file  Other Topics Concern   Not on file  Social History Narrative   Lives alone.          Social Determinants of Health   Financial Resource Strain: Low Risk  (04/29/2022)   Overall Financial Resource Strain (CARDIA)    Difficulty of Paying Living Expenses: Not hard at all  Food Insecurity: No Food Insecurity (05/20/2022)   Hunger Vital Sign    Worried About Running Out of Food in the Last Year: Never true    Ran Out of Food in the Last Year: Never true  Transportation Needs: No Transportation Needs (05/02/2022)   PRAPARE - Administrator, Civil Service (Medical): No    Lack of Transportation (Non-Medical): No  Physical Activity: Insufficiently Active (06/24/2021)   Exercise Vital Sign    Days of Exercise per Week: 7 days    Minutes of Exercise per Session: 20 min  Stress: No Stress Concern Present (06/24/2021)   [employer] of Occupational Health - Occupational Stress Questionnaire    Feeling of Stress : Not at all  Social Connections: Moderately Integrated (06/24/2021)   Social Connection and Isolation Panel [NHANES]    Frequency of Communication with Friends and Family: More than three times a week    Frequency of Social Gatherings with Friends and Family: More than three times a  week    Attends Religious Services: More than 4 times per year  Active Member of Clubs or Organizations: Yes    Attends Banker Meetings: More than 4 times per year    Marital Status: Widowed  Intimate Partner Violence: Not At Risk (04/25/2022)   Humiliation, Afraid, Rape, and Kick questionnaire    Fear of Current or Ex-Partner: No    Emotionally Abused: No    Physically Abused: No    Sexually Abused: No      Family History  Problem Relation Age of Onset   Breast cancer Mother     Vitals:   06/08/22 1452  BP: (!) 150/110  Pulse: 78  SpO2: 97%  Weight: 84.4 kg (186 lb)   Wt Readings from Last 3 Encounters:  06/08/22 84.4 kg (186 lb)  05/13/22 84.6 kg (186 lb 8 oz)  04/29/22 89.1 kg (196 lb 8 oz)    PHYSICAL EXAM: General:  Arrived in a wheel chair. No respiratory difficulty HEENT: normal Neck: supple. no JVD. Carotids 2+ bilat; no bruits. No lymphadenopathy or thryomegaly appreciated. Cor: PMI nondisplaced. Regular rate & rhythm. No rubs, gallops or murmurs. Lungs: clear Abdomen: soft, nontender, nondistended. No hepatosplenomegaly. No bruits or masses. Good bowel sounds. Extremities: no cyanosis, clubbing, rash, edema Neuro: alert & oriented x 3, cranial nerves grossly intact. moves all 4 extremities w/o difficulty. Affect pleasant.  ECG:SR with PACs 76 bpm  QRS 130 ms    ASSESSMENT & PLAN: 1. Chronic HFrEF, NICM  Back in 2022 EF was down to ~ 30% and had improved to 60% in 2023. During recent admit EF back down to ~ 30% with possible takotsubo.  She endorsed a recent deaths of friends and a grandson that is moving to [place] which may have contributed to her stress. LHC showed minimal disease.  NYHA III.Having some dyspnea with exertion.  GDMT  Diuretic- Volume status appears stable. Continue lasix as needed.  BB-Stop lopressor with reduced EF. Tomorrow she will start Toprol XL 25 mg daily  Ace/ARB/ARNI- Out of entresto. Refill entresto 24-26 mg  twice a day. She is getting entresto delivered in the mail. Will verify. Given samples today.  MRA- Spironolactone is listed however she does not have it. Hold off for now recent AKI.  SGLT2i- Continue farxiga 10 mg daily. May need to consider hydralazine/imdur.  Check BMET today  Plan to repeat ECHO in 3 months after HF meds optimized.  Asked her to weigh and record 2-3 times a week and bring recording to her appointment.   2. PAF EKG: SR  today.  As noted above switching to Toprol XL.  On coumadin. [facility]   3. Hypothyroid TSH 2   08/2021    4. H/O PE/DVT On chronic coumadin.  INR followed at [facility]  5. AKI on CKD Stage IIIb.  Had BMET 05/13/22 Creatinine trending up 1.4> 2.  She has not been taking NSAIDs.   6. HTN  Elevated. Restarting entresto as noted above.  May need to add hydralazine/imdur next visit renal function elevated.    Referred to HFSW (PCP, Medications, Transportation, ETOH Abuse, Drug Abuse, Insurance, Financial ): No Refer to Pharmacy:  No Refer to Home Health: Followed by [facility]  Refer to Advanced Heart Failure Clinic: NO Refer to General Cardiology: Existing patient of Dr Tenny Craw  Follow up in 2 weeks for med titration then back to Dr Tenny Craw.   Cederick Broadnax NP-C  3:08 PM

## 2022-06-08 NOTE — Progress Notes (Addendum)
Medication Samples have been provided to the patient.  Drug name: [name]       Strength: 24-26mg         Qty: 4  LOT: [id number]  Exp.Date: 04/2024  Dosing instructions: take 1 tab po bid.   The patient has been instructed regarding the correct time, dose, and frequency of taking this medication, including desired effects and most common side effects.   Phynix Horton R Misti Towle 4:01 PM 06/08/2022

## 2022-06-09 DIAGNOSIS — I130 Hypertensive heart and chronic kidney disease with heart failure and stage 1 through stage 4 chronic kidney disease, or unspecified chronic kidney disease: Secondary | ICD-10-CM | POA: Diagnosis not present

## 2022-06-09 DIAGNOSIS — I480 Paroxysmal atrial fibrillation: Secondary | ICD-10-CM | POA: Diagnosis not present

## 2022-06-09 DIAGNOSIS — I5022 Chronic systolic (congestive) heart failure: Secondary | ICD-10-CM | POA: Diagnosis not present

## 2022-06-09 DIAGNOSIS — I214 Non-ST elevation (NSTEMI) myocardial infarction: Secondary | ICD-10-CM | POA: Diagnosis not present

## 2022-06-09 DIAGNOSIS — N1831 Chronic kidney disease, stage 3a: Secondary | ICD-10-CM | POA: Diagnosis not present

## 2022-06-09 DIAGNOSIS — E1122 Type 2 diabetes mellitus with diabetic chronic kidney disease: Secondary | ICD-10-CM | POA: Diagnosis not present

## 2022-06-14 DIAGNOSIS — I5022 Chronic systolic (congestive) heart failure: Secondary | ICD-10-CM | POA: Diagnosis not present

## 2022-06-14 DIAGNOSIS — N1831 Chronic kidney disease, stage 3a: Secondary | ICD-10-CM | POA: Diagnosis not present

## 2022-06-14 DIAGNOSIS — I480 Paroxysmal atrial fibrillation: Secondary | ICD-10-CM | POA: Diagnosis not present

## 2022-06-14 DIAGNOSIS — I214 Non-ST elevation (NSTEMI) myocardial infarction: Secondary | ICD-10-CM | POA: Diagnosis not present

## 2022-06-14 DIAGNOSIS — E1122 Type 2 diabetes mellitus with diabetic chronic kidney disease: Secondary | ICD-10-CM | POA: Diagnosis not present

## 2022-06-14 DIAGNOSIS — I130 Hypertensive heart and chronic kidney disease with heart failure and stage 1 through stage 4 chronic kidney disease, or unspecified chronic kidney disease: Secondary | ICD-10-CM | POA: Diagnosis not present

## 2022-06-15 ENCOUNTER — Telehealth: Payer: Self-pay | Admitting: Family Medicine

## 2022-06-15 ENCOUNTER — Ambulatory Visit: Payer: Medicare Other | Attending: Cardiovascular Disease

## 2022-06-15 DIAGNOSIS — I2699 Other pulmonary embolism without acute cor pulmonale: Secondary | ICD-10-CM | POA: Diagnosis not present

## 2022-06-15 DIAGNOSIS — Z5181 Encounter for therapeutic drug level monitoring: Secondary | ICD-10-CM | POA: Diagnosis not present

## 2022-06-15 LAB — POCT INR: INR: 4.2 — AB (ref 2.0–3.0)

## 2022-06-15 NOTE — Telephone Encounter (Signed)
Extend home health PT 1x4 

## 2022-06-15 NOTE — Patient Instructions (Addendum)
Description   HOLD today's dosage of Warfarin, then start taking Warfarin 1/2 tablet daily.  Keep a green leafy veggie in your diet 2 times a week.  Recheck INR in 2 weeks.  [facility] [phone].

## 2022-06-16 NOTE — Telephone Encounter (Signed)
Left a detailed message on Hannah [name]'s voicemail with the approval for orders as below. 

## 2022-06-16 NOTE — Telephone Encounter (Signed)
Ok to send verbal order

## 2022-06-20 DIAGNOSIS — I214 Non-ST elevation (NSTEMI) myocardial infarction: Secondary | ICD-10-CM | POA: Diagnosis not present

## 2022-06-20 DIAGNOSIS — I5022 Chronic systolic (congestive) heart failure: Secondary | ICD-10-CM | POA: Diagnosis not present

## 2022-06-20 DIAGNOSIS — I130 Hypertensive heart and chronic kidney disease with heart failure and stage 1 through stage 4 chronic kidney disease, or unspecified chronic kidney disease: Secondary | ICD-10-CM | POA: Diagnosis not present

## 2022-06-20 DIAGNOSIS — N1831 Chronic kidney disease, stage 3a: Secondary | ICD-10-CM | POA: Diagnosis not present

## 2022-06-20 DIAGNOSIS — E1122 Type 2 diabetes mellitus with diabetic chronic kidney disease: Secondary | ICD-10-CM | POA: Diagnosis not present

## 2022-06-20 DIAGNOSIS — I480 Paroxysmal atrial fibrillation: Secondary | ICD-10-CM | POA: Diagnosis not present

## 2022-06-23 NOTE — Progress Notes (Addendum)
HEART & VASCULAR TRANSITION OF CARE CLINIC NOTE     Referring Physician: Dr Cristal Deer Primary Care: Dr [name] Primary Cardiologist: Dr Tenny Craw INR : Followed at [facility].    HPI: Referred to clinic by Dr Cristal Deer for heart failure consultation.   Hannah [name] is a 82 year old with a history of HFrEF with recovered EF, longstanding IVCD/LBBB on ECG, HLD, HTN, hx PEA arrest d/t PE, PAF, DMII, hypothyroidism, CKD IIIa, colostomy due to colon stricture.  In 12/22 she was admitted with elevated HS troponin and new HFrEF (EF 30-35%) in setting of COVID-19 and single episode of Afib with RVR. No significant CAD on cardiac cath.  In 2023 EF had improved to 60-65%.   Admitted 04/24/22 with RLE weakness. Stroke team consulted. CT head negative for acute findings. Work up unrevealing. Echo completed LVEF 30% + WMA concerning for possible stress cardiomyopathy vs large LAD territory MI, hyperdynamic basal LV contraction and SAM of mitral valve with LVOT gradient of 45, RV normal. LHC with minimal CAD and LV gram consistent with recovering Takotsubo cardiomyopathy.   Had lab work at PCP 05/13/22. Creatinine was up from 1.4-->2.2. Referred to Nephrology.   She was seen in [facility] clinic 06/08/22. She was out of entresto and spiro. Restarted entresto and lopressor switched to metoprolol xl. Kept off spiro d/t recent AKI.   Patient is here today for follow-up. No dyspnea at rest, orthopnea, PND or lower extremity edema. Does get some dyspnea and fatigue with exertion. Has been ambulating with a walker. Arrived in wheelchair today. Her aide weighs her and writes it down, she did not bring the log to today's visit. She manages her medications. Follows a list and places the pills in a daily Dance movement psychotherapist.  She has a caregiver 4 days a week. Also followed by home health. Had been getting home PT but they did not show up this week.  Lives alone. She has 7 grandchildren and 3 children.    Cardiac  Testing Echo  04/2022  Left ventricular ejection fraction, by estimation, is 30%. The left  ventricle has moderately decreased function. The left ventricle  demonstrates regional wall motion abnormalities with hyperkinetic basal LV  segments and akinesis of the mid to apical  anteroseptal, mid-apical inferoseptal, apical lateral, apical anterior,  and mid-apical inferior walls as well as the true apex. With hyperdynamic  basal LV contraction, there is mitral valve systolic anterior motion with  LV outflow tract gradient, peak 45   mmHg. These findings are consistent with large wrap-around LAD territory  MI or a stress (Takotsubo-type) cardiomyopathy. There is mild left  ventricular hypertrophy. Left ventricular diastolic parameters are  consistent with Grade I diastolic dysfunction  (impaired relaxation).   2. Right ventricular systolic function is normal. The right ventricular  size is normal. There is normal pulmonary artery systolic pressure. The  estimated right ventricular systolic pressure is 30.2 mmHg.   3. There was systolic anterior motion of the anterior mitral leaflet. The  mitral valve is abnormal. Mild to moderate mitral valve regurgitation. No  evidence of mitral stenosis.   4. The aortic valve is tricuspid. There is mild calcification of the  aortic valve. Aortic valve regurgitation is not visualized. No aortic stenosis is present.   Echo 2023 EF ~ 60%.  Echo 12/2020 EF ~ 30%  Echo 2020 EF 55-60%    LHC 04/2022  Prox RCA to Mid RCA lesion is 20% stenosed  1.  Minimal obstructive coronary artery  disease relatively unchanged versus prior study. 2.  LVEDP of 8 mmHg and ventriculography consistent with recovering Takotsubo cardiomyopathy. 3.  Occlusion of the right radial artery in the upper forearm.  LHC 2022    Prox RCA to Mid RCA lesion is 20% stenosed.  Mild plaque in the mid segment of the large, dominant RCA No obstructive disease in the LAD or Circumflex Normal  LV filling pressure.     Past Medical History:  Diagnosis Date   Acute massive pulmonary embolism (HCC) 07/13/2012   Massive PE w/ PEA arrest 07/13/12 >TNK >IVC filter >discharged on comadin     Anticoagulated on warfarin    Chronic diastolic CHF (congestive heart failure) (HCC) 03/15/2017   Chronic kidney disease, stage 3a (HCC) 12/15/2018   Diabetes mellitus, type 2 (HCC) 07/15/2012   with peripheral neuropathy   DM2 (diabetes mellitus, type 2) (HCC) 07/23/2012   HFrEF (heart failure with reduced ejection fraction) (HCC) 03/15/2017   Non-ischemic cardiomyopathy // Cardiac catheterization 01/01/21:  RCA prox to mid 20 // Echocardiogram 12/26/20:  EF 30-35, ant septum/apex, mid inf-sept AK; ant-lat/ant/inf HK, Gr 1 DD, mild LVH, normal RVSF, RVSP 27.2, small effusion (ant to RV), mild MR, mod TR, AV sclerosis w/o AS   Hyperlipemia 07/14/2012   Hyperlipidemia 02/25/2013   Hypertension 07/14/2012   Hypothyroid 06/06/2013   Large bowel stricture (HCC)    s/p colectomy in 2016   Neuropathy 02/12/2015   Osteoarthritis    s/p hip and knee replacements   PAF (paroxysmal atrial fibrillation) (HCC)    Pulmonary hypertension (HCC) 07/26/2016   Syncope 10/2018   Thyroid disease     Current Outpatient Medications  Medication Sig Dispense Refill   acetaminophen (TYLENOL) 500 MG tablet Take 1,000 mg by mouth every 6 (six) hours as needed for mild pain.     atorvastatin (LIPITOR) 40 MG tablet Take 1 tablet (40 mg total) by mouth daily. 90 tablet 1   dapagliflozin propanediol (FARXIGA) 10 MG TABS tablet Take 1 tablet (10 mg total) by mouth daily before breakfast. 90 tablet 3   diclofenac Sodium (VOLTAREN ARTHRITIS PAIN) 1 % GEL Apply 2 g topically as needed.     furosemide (LASIX) 20 MG tablet Take 1 tablet (20 mg total) by mouth daily as needed for fluid or edema. 90 tablet 1   glucose blood (ONETOUCH VERIO) test strip USE 1 STRIP TO CHECK GLUCOSE ONCE DAILY AS DIRECTED 100 each 3   metoprolol succinate  (TOPROL XL) 25 MG 24 hr tablet Take 1 tablet (25 mg total) by mouth daily. 30 tablet 11   nitroGLYCERIN (NITROSTAT) 0.4 MG SL tablet Place 1 tablet (0.4 mg total) under the tongue every 5 (five) minutes as needed for chest pain. 30 tablet 3   OneTouch Delica Lancets 30G MISC USE 1  TO CHECK GLUCOSE ONCE DAILY 100 each 3   sacubitril-valsartan (ENTRESTO) 24-26 MG Take 1 tablet by mouth 2 (two) times daily. 180 tablet 3   vitamin B-12 (CYANOCOBALAMIN) 1000 MCG tablet Take 1 tablet (1,000 mcg total) by mouth daily.     vitamin C (ASCORBIC ACID) 500 MG tablet Take 500 mg by mouth daily.      warfarin (COUMADIN) 5 MG tablet TAKE 1-2 TABLETS BY MOUTH ONCE DAILY OR AS DIRECTED  BY  COUMADIN  CLINIC (Patient taking differently: Take 2.5-5 mg by mouth See admin instructions. Take 2.5 mg by mouth at bedtime on Sun/Mon/Tues/Thurs/ Sat and 5 mg on Wed/Fri) 80 tablet 1   pantoprazole (  PROTONIX) 40 MG tablet Take 1 tablet (40 mg total) by mouth daily. (Patient not taking: Reported on 06/24/2022) 90 tablet 3   No current facility-administered medications for this encounter.    Allergies  Allergen Reactions   Augmentin [Amoxicillin-Pot Clavulanate] Itching and Other (See Comments)    Severe vaginal itching   Tranxene [Clorazepate] Itching   Crestor [Rosuvastatin]     Made her sick on stomach, she is able to tolerate the zocor   Penicillins Itching and Rash      Social History   Socioeconomic History   Marital status: Widowed    Spouse name: Not on file   Number of children: 3   Years of education: Not on file   Highest education level: Not on file  Occupational History   Occupation: Retired  Tobacco Use   Smoking status: Former    Packs/day: 1.00    Years: 10.00    Additional pack years: 0.00    Total pack years: 10.00    Types: Cigarettes    Quit date: 01/04/1968    Years since quitting: 54.5   Smokeless tobacco: Never  Vaping Use   Vaping Use: Never used  Substance and Sexual Activity    Alcohol use: No   Drug use: No   Sexual activity: Not on file  Other Topics Concern   Not on file  Social History Narrative   Lives alone.          Social Determinants of Health   Financial Resource Strain: Low Risk  (04/29/2022)   Overall Financial Resource Strain (CARDIA)    Difficulty of Paying Living Expenses: Not hard at all  Food Insecurity: No Food Insecurity (05/20/2022)   Hunger Vital Sign    Worried About Running Out of Food in the Last Year: Never true    Ran Out of Food in the Last Year: Never true  Transportation Needs: No Transportation Needs (05/02/2022)   PRAPARE - Administrator, Civil Service (Medical): No    Lack of Transportation (Non-Medical): No  Physical Activity: Insufficiently Active (06/24/2021)   Exercise Vital Sign    Days of Exercise per Week: 7 days    Minutes of Exercise per Session: 20 min  Stress: No Stress Concern Present (06/24/2021)   [employer] of Occupational Health - Occupational Stress Questionnaire    Feeling of Stress : Not at all  Social Connections: Moderately Integrated (06/24/2021)   Social Connection and Isolation Panel [NHANES]    Frequency of Communication with Friends and Family: More than three times a week    Frequency of Social Gatherings with Friends and Family: More than three times a week    Attends Religious Services: More than 4 times per year    Active Member of [employer] or Organizations: Yes    Attends Banker Meetings: More than 4 times per year    Marital Status: Widowed  Intimate Partner Violence: Not At Risk (04/25/2022)   Humiliation, Afraid, Rape, and Kick questionnaire    Fear of Current or Ex-Partner: No    Emotionally Abused: No    Physically Abused: No    Sexually Abused: No      Family History  Problem Relation Age of Onset   Breast cancer Mother     Vitals:   06/24/22 1204  BP: (!) 140/90  Pulse: 67  SpO2: 96%  Weight: 83.7 kg (184 lb 9.6 oz)    Wt Readings from  Last 3 Encounters:  06/24/22 83.7  kg (184 lb 9.6 oz)  06/08/22 84.4 kg (186 lb)  05/13/22 84.6 kg (186 lb 8 oz)    PHYSICAL EXAM: General:  Arrived in a wheel chair. No respiratory difficulty HEENT: normal Neck: supple. no JVD. Carotids 2+ bilat; no bruits. No lymphadenopathy or thryomegaly appreciated. Cor: PMI nondisplaced. Regular rate & rhythm. No rubs, gallops or murmurs. Lungs: clear Abdomen: soft, nontender, nondistended. No hepatosplenomegaly. No bruits or masses. Good bowel sounds. Extremities: no cyanosis, clubbing, rash, edema Neuro: alert & oriented x 3, cranial nerves grossly intact. moves all 4 extremities w/o difficulty. Affect pleasant.    ASSESSMENT & PLAN: 1. Chronic HFrEF, NICM  -Back in 2022 EF was down to ~ 30% in setting of COVID-19 infection and AF with RVR.  EF had improved to 60% in 2023.  -04/24 EF back down to ~ 30% with possible takotsubo.  She endorsed a recent deaths of friends and a grandson that is moving to [place] which may have contributed to her stress. LHC showed minimal disease.  -Longstanding IVCD/LBBB on ECG. QRS has been less than 150 Hannah. NYHA III GDMT  Diuretic- Volume status appears stable. Continue lasix as needed. Continue to keep log of weights and bring to appointments. BB-Continue Toprol XL 25 mg daily  Ace/ARB/ARNI- Continue entresto 24-26 mg twice a day.  MRA- Start spiro if today's labs are stable. SGLT2i- Continue farxiga 10 mg daily. Check BMET today  Plan to repeat ECHO in 3 months after HF meds optimized.   2. PAF -SR on ECG 06/05 -Regular on exam today -Continue Toprol XL -On coumadin. [facility].   3. H/O PE/DVT -On chronic coumadin.  -INR followed at [facility]  4. AKI on CKD Stage IIIb.  -Had BMET 05/13/22. Creatinine trending up 1.4> 2. Referred to Nephrology. -Cr improved to 1.24 on repeat labs 06/05. -Check BMET today as above  5. HTN  -BP above goal today -Depending on  today's labs will add spiro as above   Follow up PRN, keep follow-up with Dr. Tenny Craw on 06/26  Jovian Lembcke N PA-C 1:57 PM

## 2022-06-24 ENCOUNTER — Encounter (HOSPITAL_COMMUNITY): Payer: Self-pay

## 2022-06-24 ENCOUNTER — Ambulatory Visit (HOSPITAL_COMMUNITY)
Admission: RE | Admit: 2022-06-24 | Discharge: 2022-06-24 | Disposition: A | Discharge status: Home / Self Care | Payer: Medicare Other | Source: Ambulatory Visit | Attending: Physician Assistant | Admitting: Physician Assistant

## 2022-06-24 VITALS — BP 140/90 | HR 67 | Wt 184.6 lb

## 2022-06-24 DIAGNOSIS — I480 Paroxysmal atrial fibrillation: Secondary | ICD-10-CM | POA: Insufficient documentation

## 2022-06-24 DIAGNOSIS — Z7984 Long term (current) use of oral hypoglycemic drugs: Secondary | ICD-10-CM | POA: Diagnosis not present

## 2022-06-24 DIAGNOSIS — I2510 Atherosclerotic heart disease of native coronary artery without angina pectoris: Secondary | ICD-10-CM | POA: Diagnosis not present

## 2022-06-24 DIAGNOSIS — Z7901 Long term (current) use of anticoagulants: Secondary | ICD-10-CM | POA: Insufficient documentation

## 2022-06-24 DIAGNOSIS — E1122 Type 2 diabetes mellitus with diabetic chronic kidney disease: Secondary | ICD-10-CM | POA: Diagnosis not present

## 2022-06-24 DIAGNOSIS — Z86718 Personal history of other venous thrombosis and embolism: Secondary | ICD-10-CM | POA: Insufficient documentation

## 2022-06-24 DIAGNOSIS — E039 Hypothyroidism, unspecified: Secondary | ICD-10-CM | POA: Diagnosis not present

## 2022-06-24 DIAGNOSIS — I5042 Chronic combined systolic (congestive) and diastolic (congestive) heart failure: Secondary | ICD-10-CM | POA: Insufficient documentation

## 2022-06-24 DIAGNOSIS — Z79899 Other long term (current) drug therapy: Secondary | ICD-10-CM | POA: Insufficient documentation

## 2022-06-24 DIAGNOSIS — I10 Essential (primary) hypertension: Secondary | ICD-10-CM | POA: Diagnosis not present

## 2022-06-24 DIAGNOSIS — I447 Left bundle-branch block, unspecified: Secondary | ICD-10-CM | POA: Insufficient documentation

## 2022-06-24 DIAGNOSIS — Z86711 Personal history of pulmonary embolism: Secondary | ICD-10-CM | POA: Insufficient documentation

## 2022-06-24 DIAGNOSIS — Z8616 Personal history of COVID-19: Secondary | ICD-10-CM | POA: Insufficient documentation

## 2022-06-24 DIAGNOSIS — Z8674 Personal history of sudden cardiac arrest: Secondary | ICD-10-CM | POA: Diagnosis not present

## 2022-06-24 DIAGNOSIS — N1832 Chronic kidney disease, stage 3b: Secondary | ICD-10-CM | POA: Diagnosis not present

## 2022-06-24 DIAGNOSIS — I130 Hypertensive heart and chronic kidney disease with heart failure and stage 1 through stage 4 chronic kidney disease, or unspecified chronic kidney disease: Secondary | ICD-10-CM | POA: Diagnosis not present

## 2022-06-24 DIAGNOSIS — I5020 Unspecified systolic (congestive) heart failure: Secondary | ICD-10-CM | POA: Diagnosis not present

## 2022-06-24 DIAGNOSIS — I428 Other cardiomyopathies: Secondary | ICD-10-CM | POA: Diagnosis not present

## 2022-06-24 DIAGNOSIS — E785 Hyperlipidemia, unspecified: Secondary | ICD-10-CM | POA: Insufficient documentation

## 2022-06-24 DIAGNOSIS — N179 Acute kidney failure, unspecified: Secondary | ICD-10-CM | POA: Insufficient documentation

## 2022-06-24 LAB — BASIC METABOLIC PANEL
Anion gap: 11 (ref 5–15)
BUN: 14 mg/dL (ref 8–23)
CO2: 19 mmol/L — ABNORMAL LOW (ref 22–32)
Calcium: 9 mg/dL (ref 8.9–10.3)
Chloride: 106 mmol/L (ref 98–111)
Creatinine, Ser: 1.22 mg/dL — ABNORMAL HIGH (ref 0.44–1.00)
GFR, Estimated: 45 mL/min — ABNORMAL LOW (ref >60)
Glucose, Bld: 122 mg/dL — ABNORMAL HIGH (ref 70–99)
Potassium: 3.8 mmol/L (ref 3.5–5.1)
Sodium: 136 mmol/L (ref 135–145)

## 2022-06-24 NOTE — Patient Instructions (Addendum)
Thank you for your visit today.  Labs done today, your results will be available in MyChart, we will contact you for abnormal readings.  Thank you for allowing [place] to provider your heart failure care after your recent hospitalization. Please follow-up with Dr. Tenny Craw as scheduled.

## 2022-06-27 ENCOUNTER — Telehealth (HOSPITAL_COMMUNITY): Payer: Self-pay

## 2022-06-27 MED ORDER — SPIRONOLACTONE 25 MG PO TABS: 12.5000 mg | ORAL_TABLET | Freq: Every day | ORAL | 0 refills | Status: DC

## 2022-06-27 NOTE — Telephone Encounter (Signed)
-----   Message from Andrey Farmer, [place] sent at 06/24/2022  2:11 PM EDT ----- Labs stable. Add spiro 12.5 mg daily. Please see if repeat BMET can be done with follow-up with Cardiology next week.

## 2022-06-27 NOTE — Telephone Encounter (Signed)
Patients daughter [name], advised and verbalized understanding. New Rx sent into patients pharmacy. Added to appointment notes for bmet.  Meds ordered this encounter  Medications   spironolactone (ALDACTONE) 25 MG tablet    Sig: Take 0.5 tablets (12.5 mg total) by mouth daily.    Dispense:  15 tablet    Refill:  0

## 2022-06-28 NOTE — Progress Notes (Unsigned)
Cardiology Office Note:    Date:  06/28/2022   ID:  Hannah [name], DOB May 28, 1940, MRN [id number]  PCP:  [name], MD  [facility] Providers Cardiologist:  Dietrich Pates, MD Cardiology APP:  Kennon Rounds     Referring MD: [name], MD   Patient presents for follow up   Patient Profile: Prior massive pulmonary embolism  Lifelong anticoagulation (Warfarin) (HFrEF) heart failure with reduced ejection fraction  Non-ischemic cardiomyopathy  Previously managed for Diastolic CHF Echocardiogram 10/2018: EF 55-60, Gr 1 DD Echocardiogram 4/22: EF 45-50 Echocardiogram 12/22: EF 30-35, Gr 1 DD, mild MR, mod TR Coronary calcification on CT Myoview 12/2018: Low risk Cath 12/22: no sig CAD (prox to mid RCA 20) Paroxysmal atrial fibrillation  Noted during admx for COVID-19, new onset CHF, chest pain in 12/22 Diabetes mellitus  Hypertension  Hyperlipidemia  Hypothyroidism  Chronic kidney disease  Hx of large bowel stricture, s/p colectomy w colostomy  admx in 12/2017 w L breast hematoma and cellulitis; Hgb dropped from 11.5>>8.8 (INR = 3.48); Warfarin held for 2 weeks admx for Syncope in 10/2018; neg workup (CT, MRI, Echocardiogram); felt to be due to orthostatic hypotension; poor hydration  Hx of LGI bleed (?diverticular) in 04/2020   Prior CV Studies:  Echo   May 2023   1. Left ventricular ejection fraction, by estimation, is 60 to 65%. The  left ventricle has normal function. The left ventricle has no regional  wall motion abnormalities. Left ventricular diastolic parameters are  consistent with Grade I diastolic  dysfunction (impaired relaxation).   2. Right ventricular systolic function is normal. The right ventricular  size is normal. There is normal pulmonary artery systolic pressure.   3. The mitral valve is normal in structure. Trivial mitral valve  regurgitation. No evidence of mitral stenosis.   4. The aortic valve is tricuspid. Aortic valve  regurgitation is not  visualized. No aortic stenosis is present.   5. The inferior vena cava is normal in size with greater than 50%  respiratory variability, suggesting right atrial pressure of 3 mmHg.   Cardiac catheterization 01/01/21 RCA prox to mid 20   Cardiac catheterization 12/30/20 Normal R heart pressures Unsuccessful L heart cath > returned 01/01/21   Echocardiogram 12/26/20 EF 30-35, ant septum/apex, mid inf-sept AK; ant-lat/ant/inf HK, Gr 1 DD, mild LVH, normal RVSF, RVSP 27.2, small effusion (ant to RV), mild MR, mod TR, AV sclerosis w/o AS   Echocardiogram 04/23/20 Inf HK, EF 45-50, mild LVH, Gr 1 DD   Myoview 12/16/2018 No ischemia or infarction, EF 57; Low Risk    Echocardiogram 10/31/2018 EF 55-60, Gr 1 DD, normal RVSF, mild MAC, mild MR, mild TR    History of Present Illness:   Hannah [name] is a 82 y.o. female with the above problem list.  She was admitted in 12/22 with elevated hs-Trop levels and newly depressed EF (30-35) in the setting of COVID-19 c/b a single episode of AF w RVR.  She had no significant CAD on cath.   I sawa the pt in May 2023    Since seen she has done OK   Breathing is stable   No CP  No palpitations    LImited echo in May 2023 showed LVEF had normalized       I saw the pt in Dec 2023    [facility] was admitted with RLE weakness in APril 2024   CT neg for stroke   Echo showed LVEF 30%, possible  Takotsubo's CM   LHC showed minimal dz She was seen in early June in the [facility]   Patient says herbreathing is good  She denies CP     Getting around OK   Past Medical History:  Diagnosis Date   Acute massive pulmonary embolism (HCC) 07/13/2012   Massive PE w/ PEA arrest 07/13/12 >TNK >IVC filter >discharged on comadin     Anticoagulated on warfarin    Chronic diastolic CHF (congestive heart failure) (HCC) 03/15/2017   Chronic kidney disease, stage 3a (HCC) 12/15/2018   Diabetes mellitus, type 2 (HCC) 07/15/2012   with peripheral neuropathy   DM2  (diabetes mellitus, type 2) (HCC) 07/23/2012   HFrEF (heart failure with reduced ejection fraction) (HCC) 03/15/2017   Non-ischemic cardiomyopathy // Cardiac catheterization 01/01/21:  RCA prox to mid 20 // Echocardiogram 12/26/20:  EF 30-35, ant septum/apex, mid inf-sept AK; ant-lat/ant/inf HK, Gr 1 DD, mild LVH, normal RVSF, RVSP 27.2, small effusion (ant to RV), mild MR, mod TR, AV sclerosis w/o AS   Hyperlipemia 07/14/2012   Hyperlipidemia 02/25/2013   Hypertension 07/14/2012   Hypothyroid 06/06/2013   Large bowel stricture (HCC)    s/p colectomy in 2016   Neuropathy 02/12/2015   Osteoarthritis    s/p hip and knee replacements   PAF (paroxysmal atrial fibrillation) (HCC)    Pulmonary hypertension (HCC) 07/26/2016   Syncope 10/2018   Thyroid disease    Current Medications: No outpatient medications have been marked as taking for the 06/29/22 encounter (Appointment) with Pricilla Riffle, MD.    Allergies:   Augmentin [amoxicillin-pot clavulanate], Tranxene [clorazepate], Crestor [rosuvastatin], and Penicillins   Social History   Tobacco Use   Smoking status: Former    Packs/day: 1.00    Years: 10.00    Additional pack years: 0.00    Total pack years: 10.00    Types: Cigarettes    Quit date: 01/04/1968    Years since quitting: 54.5   Smokeless tobacco: Never  Vaping Use   Vaping Use: Never used  Substance Use Topics   Alcohol use: No   Drug use: No    Family Hx: The patient's family history includes Breast cancer in her mother.  ROS see HPI  EKGs/Labs/Other Test Reviewed:    EKG:  EKG is   not ordered today     Recent Labs: 08/19/2021: TSH 2.00 11/23/2021: B Natriuretic Peptide 109.4 04/27/2022: ALT 12 04/30/2022: Magnesium 2.0 05/13/2022: Hemoglobin 15.2; Platelets 220.0 06/24/2022: BUN 14; Creatinine, Ser 1.22; Potassium 3.8; Sodium 136   Recent Lipid Panel Recent Labs    04/25/22 0549  CHOL 118  TRIG 51  HDL 49  VLDL 10  LDLCALC 59      Risk  Assessment/Calculations:    CHA2DS2-VASc Score = 6   This indicates a 9.7% annual risk of stroke. The patient's score is based upon: CHF History: 1 HTN History: 1 Diabetes History: 1 Stroke History: 0 Vascular Disease History: 0 Age Score: 2 Gender Score: 1   Physical Exam:    VS:  There were no vitals taken for this visit.    Wt Readings from Last 3 Encounters:  06/24/22 184 lb 9.6 oz (83.7 kg)  06/08/22 186 lb (84.4 kg)  05/13/22 186 lb 8 oz (84.6 kg)    Pt in NAD Neck   JVP is not elevated   Lungs are CTA  Cardiac RRR   No S3   No signficant murmur Abd is supple    Ext  No LE  edema         ASSESSMENT AND PLAN  HFrEF (heart failure with reduced ejection fraction) (HCC) Non-ischemic cardiomyopathy.  EF 30-35.  NYHA II-IIb.  Repeat echo in May showed LVEF nad normalized    Volume status looks good Recomm  Continue on Entresto 24/26 bid, dapagliflozin 10, Toprol XL 50, aldactone.    PAF (paroxysmal atrial fibrillation) (HCC) Clinically remains  in SR Keep on lifelong coumadin b/c of prior hx of massive pulmonary embolism.     CAD (coronary artery disease) Minimal nonobstructive CAD at cath      Hypertension BP is controlled      Hyperlipidemia Continue atorvastatin 40 mg daily.   LDL is 66  Hx PE    Continue coumadin   INR today          Follow up in 6 months   Signed, Dietrich Pates, MD  06/28/2022 4:08 PM    [facility] [address] [place], [place], [place]  [address] Phone: [phone]; Fax: [phone]

## 2022-06-29 ENCOUNTER — Ambulatory Visit: Payer: Medicare Other | Attending: Internal Medicine | Admitting: Internal Medicine

## 2022-06-29 ENCOUNTER — Encounter: Payer: Self-pay | Admitting: Internal Medicine

## 2022-06-29 ENCOUNTER — Ambulatory Visit: Payer: Medicare Other | Admitting: *Deleted

## 2022-06-29 VITALS — BP 126/92 | HR 74 | Ht 67.0 in | Wt 184.8 lb

## 2022-06-29 DIAGNOSIS — I5020 Unspecified systolic (congestive) heart failure: Secondary | ICD-10-CM | POA: Diagnosis not present

## 2022-06-29 DIAGNOSIS — Z79899 Other long term (current) drug therapy: Secondary | ICD-10-CM

## 2022-06-29 DIAGNOSIS — I214 Non-ST elevation (NSTEMI) myocardial infarction: Secondary | ICD-10-CM | POA: Diagnosis not present

## 2022-06-29 DIAGNOSIS — E1122 Type 2 diabetes mellitus with diabetic chronic kidney disease: Secondary | ICD-10-CM | POA: Diagnosis not present

## 2022-06-29 DIAGNOSIS — I5022 Chronic systolic (congestive) heart failure: Secondary | ICD-10-CM | POA: Diagnosis not present

## 2022-06-29 DIAGNOSIS — I480 Paroxysmal atrial fibrillation: Secondary | ICD-10-CM | POA: Diagnosis not present

## 2022-06-29 DIAGNOSIS — N1831 Chronic kidney disease, stage 3a: Secondary | ICD-10-CM | POA: Diagnosis not present

## 2022-06-29 DIAGNOSIS — I130 Hypertensive heart and chronic kidney disease with heart failure and stage 1 through stage 4 chronic kidney disease, or unspecified chronic kidney disease: Secondary | ICD-10-CM | POA: Diagnosis not present

## 2022-06-29 LAB — POCT INR: INR: 5.4 — AB (ref 2.0–3.0)

## 2022-06-29 MED ORDER — WARFARIN SODIUM 2.5 MG PO TABS
2.5000 mg | ORAL_TABLET | Freq: Every day | ORAL | 0 refills | Status: DC
Start: 2022-06-29 — End: 2022-08-28

## 2022-06-29 NOTE — Patient Instructions (Addendum)
Description   START USING 2.5MG  GREEN TABLET. Do not take any warfarin today and no warfarin tomorrow then START taking Warfarin 2.5mg  (1 tablet) daily except 1.25mg  (1/2 tablet) on Sundays.  Keep a green leafy veggie in your diet 2 times a week.  Recheck INR in 1 week.  [facility] [phone].

## 2022-06-29 NOTE — Patient Instructions (Signed)
Medication Instructions:  Your physician recommends that you continue on your current medications as directed. Please refer to the Current Medication list given to you today.  *If you need a refill on your cardiac medications before your next appointment, please call your pharmacy*  Lab Work: TODAY: BMET If you have labs (blood work) drawn today and your tests are completely normal, you will receive your results only by: MyChart Message (if you have MyChart) OR A paper copy in the mail If you have any lab test that is abnormal or we need to change your treatment, we will call you to review the results.  Testing/Procedures: Your physician has requested that you have an echocardiogram early August 2024. Echocardiography is a painless test that uses sound waves to create images of your heart. It provides your doctor with information about the size and shape of your heart and how well your heart's chambers and valves are working. This procedure takes approximately one hour. There are no restrictions for this procedure. Please do NOT wear cologne, perfume, aftershave, or lotions (deodorant is allowed). Please arrive 15 minutes prior to your appointment time.  Follow-Up: At [facility], you and your health needs are our priority.  As part of our continuing mission to provide you with exceptional heart care, we have created designated Provider Care Teams.  These Care Teams include your primary Cardiologist (physician) and Advanced Practice Providers (APPs -  Physician Assistants and Nurse Practitioners) who all work together to provide you with the care you need, when you need it.  Your next appointment:   3 month(s) September 2024  The format for your next appointment:   In Person  Provider:   Dietrich Pates, MD

## 2022-06-29 NOTE — Progress Notes (Signed)
Cardiology Office Note   Date:  06/29/2022   ID:  Hannah [name], DOB 1940-11-16, MRN [id number]  PCP:  [name], MD  Cardiologist:   Dietrich Pates, MD       History of Present Illness: Hannah [name] is a 82 y.o. female with a history of HFrEF (limited echo in May 2023, LVEF normalized), minimal CAD (cath in 2022 20% mid RCA), PAF (noted during admit for COVID 19), DM, HTN, HL, syncope (Oct 2020, work up neg; felt to be due to dehydrrtion). I saw the pt in Dec 2023  She was admitted with RLE weakness in APril 2024   CT neg for stroke   Echo showed LVEF 30%, possible Takotsubo's CM   LHC showed minimal dz She was seen in early June in the [facility]   Today the pt says  her breathing is good  She denies CP     Getting around OK   Current Meds  Medication Sig   acetaminophen (TYLENOL) 500 MG tablet Take 1,000 mg by mouth every 6 (six) hours as needed for mild pain.   atorvastatin (LIPITOR) 40 MG tablet Take 1 tablet (40 mg total) by mouth daily.   dapagliflozin propanediol (FARXIGA) 10 MG TABS tablet Take 1 tablet (10 mg total) by mouth daily before breakfast.   diclofenac Sodium (VOLTAREN ARTHRITIS PAIN) 1 % GEL Apply 2 g topically as needed.   furosemide (LASIX) 20 MG tablet Take 1 tablet (20 mg total) by mouth daily as needed for fluid or edema.   glucose blood (ONETOUCH VERIO) test strip USE 1 STRIP TO CHECK GLUCOSE ONCE DAILY AS DIRECTED   metoprolol succinate (TOPROL XL) 25 MG 24 hr tablet Take 1 tablet (25 mg total) by mouth daily.   nitroGLYCERIN (NITROSTAT) 0.4 MG SL tablet Place 1 tablet (0.4 mg total) under the tongue every 5 (five) minutes as needed for chest pain.   OneTouch Delica Lancets 30G MISC USE 1  TO CHECK GLUCOSE ONCE DAILY   pantoprazole (PROTONIX) 40 MG tablet Take 1 tablet (40 mg total) by mouth daily.   sacubitril-valsartan (ENTRESTO) 24-26 MG Take 1 tablet by mouth 2 (two) times daily.   spironolactone (ALDACTONE) 25 MG tablet Take 0.5 tablets (12.5 mg  total) by mouth daily.   vitamin B-12 (CYANOCOBALAMIN) 1000 MCG tablet Take 1 tablet (1,000 mcg total) by mouth daily.   vitamin C (ASCORBIC ACID) 500 MG tablet Take 500 mg by mouth daily.    warfarin (COUMADIN) 2.5 MG tablet Take 1 tablet (2.5 mg total) by mouth daily.     Allergies:   Augmentin [amoxicillin-pot clavulanate], Tranxene [clorazepate], Crestor [rosuvastatin], and Penicillins   Past Medical History:  Diagnosis Date   Acute massive pulmonary embolism (HCC) 07/13/2012   Massive PE w/ PEA arrest 07/13/12 >TNK >IVC filter >discharged on comadin     Anticoagulated on warfarin    Chronic diastolic CHF (congestive heart failure) (HCC) 03/15/2017   Chronic kidney disease, stage 3a (HCC) 12/15/2018   Diabetes mellitus, type 2 (HCC) 07/15/2012   with peripheral neuropathy   DM2 (diabetes mellitus, type 2) (HCC) 07/23/2012   HFrEF (heart failure with reduced ejection fraction) (HCC) 03/15/2017   Non-ischemic cardiomyopathy // Cardiac catheterization 01/01/21:  RCA prox to mid 20 // Echocardiogram 12/26/20:  EF 30-35, ant septum/apex, mid inf-sept AK; ant-lat/ant/inf HK, Gr 1 DD, mild LVH, normal RVSF, RVSP 27.2, small effusion (ant to RV), mild MR, mod TR, AV sclerosis w/o AS   Hyperlipemia 07/14/2012  Hyperlipidemia 02/25/2013   Hypertension 07/14/2012   Hypothyroid 06/06/2013   Large bowel stricture (HCC)    s/p colectomy in 2016   Neuropathy 02/12/2015   Osteoarthritis    s/p hip and knee replacements   PAF (paroxysmal atrial fibrillation) (HCC)    Pulmonary hypertension (HCC) 07/26/2016   Syncope 10/2018   Thyroid disease     Past Surgical History:  Procedure Laterality Date   COLONOSCOPY N/A 03/12/2016   Procedure: COLONOSCOPY;  Surgeon: Dorena Cookey, MD;  Location: [facility] ENDOSCOPY;  Service: Endoscopy;  Laterality: N/A;   COLOSTOMY N/A 06/03/2014   Procedure: COLOSTOMY;  Surgeon: Harriette Bouillon, MD;  Location: [facility] OR;  Service: General;  Laterality: N/A;   FLEXIBLE SIGMOIDOSCOPY N/A  05/30/2014   Procedure: Arnell Sieving;  Surgeon: Jeani Hawking, MD;  Location: [facility] ENDOSCOPY;  Service: Endoscopy;  Laterality: N/A;   INSERTION OF VENA CAVA FILTER N/A 07/16/2012   Procedure: INSERTION OF VENA CAVA FILTER;  Surgeon: [name], MD;  Location: [facility] CATH LAB;  Service: Cardiovascular;  Laterality: N/A;   KNEE ARTHROSCOPY     LEFT HEART CATH AND CORONARY ANGIOGRAPHY N/A 01/01/2021   Procedure: LEFT HEART CATH AND CORONARY ANGIOGRAPHY;  Surgeon: Kathleene Hazel, MD;  Location: [facility] INVASIVE CV LAB;  Service: Cardiovascular;  Laterality: N/A;   LEFT HEART CATH AND CORONARY ANGIOGRAPHY N/A 04/29/2022   Procedure: LEFT HEART CATH AND CORONARY ANGIOGRAPHY;  Surgeon: Orbie Pyo, MD;  Location: [facility] INVASIVE CV LAB;  Service: Cardiovascular;  Laterality: N/A;   PARTIAL COLECTOMY N/A 06/03/2014   Procedure: PARTIAL COLECTOMY;  Surgeon: Harriette Bouillon, MD;  Location: [facility] OR;  Service: General;  Laterality: N/A;   REDUCTION MAMMAPLASTY Bilateral    RIGHT HEART CATH N/A 12/30/2020   Procedure: RIGHT HEART CATH;  Surgeon: Kathleene Hazel, MD;  Location: [facility] INVASIVE CV LAB;  Service: Cardiovascular;  Laterality: N/A;   SP ARTHRO HIP*L*       Social History:  The patient  reports that she quit smoking about 54 years ago. Her smoking use included cigarettes. She has a 10.00 pack-year smoking history. She has never used smokeless tobacco. She reports that she does not drink alcohol and does not use drugs.   Family History:  The patient's family history includes Breast cancer in her mother.    ROS:  Please see the history of present illness. All other systems are reviewed and  Negative to the above problem except as noted.    PHYSICAL EXAM: VS:  BP (!) 126/92   Pulse 74   Ht 5\' 7"  (1.702 m)   Wt 184 lb 12.8 oz (83.8 kg)   SpO2 97%   BMI 28.94 kg/m   GEN: Well nourished, well developed, in no acute distress   Examined in chair HEENT: normal  Neck: no JVD,  carotid bruit Cardiac: RRR; no murmur   No LE edema  Respiratory:  clear to auscultation bilaterally,  GI: soft, nontender No hepatomegaly    EKG:  EKG is not ordered today.  Cardiac Tests   2024 Echo   1. Left ventricular ejection fraction, by estimation, is 30%. The left  ventricle has moderately decreased function. The left ventricle  demonstrates regional wall motion abnormalities with hyperkinetic basal LV  segments and akinesis of the mid to apical  anteroseptal, mid-apical inferoseptal, apical lateral, apical anterior,  and mid-apical inferior walls as well as the true apex. With hyperdynamic  basal LV contraction, there is mitral valve systolic anterior motion with  LV outflow tract gradient, peak 45   mmHg. These findings are consistent with large wrap-around LAD territory  MI or a stress (Takotsubo-type) cardiomyopathy. There is mild left  ventricular hypertrophy. Left ventricular diastolic parameters are  consistent with Grade I diastolic dysfunction  (impaired relaxation).   2. Right ventricular systolic function is normal. The right ventricular  size is normal. There is normal pulmonary artery systolic pressure. The  estimated right ventricular systolic pressure is 30.2 mmHg.   3. There was systolic anterior motion of the anterior mitral leaflet. The  mitral valve is abnormal. Mild to moderate mitral valve regurgitation. No  evidence of mitral stenosis.   4. The aortic valve is tricuspid. There is mild calcification of the  aortic valve. Aortic valve regurgitation is not visualized. No aortic  stenosis is present.   5. The inferior vena cava is normal in size with greater than 50%  respiratory variability, suggesting right atrial pressure of 3 mmHg.   Lipid Panel    Component Value Date/Time   CHOL 118 04/25/2022 0549   TRIG 51 04/25/2022 0549   HDL 49 04/25/2022 0549   CHOLHDL 2.4 04/25/2022 0549   VLDL 10 04/25/2022 0549   LDLCALC 59 04/25/2022 0549    LDLCALC 95 10/30/2019 1109      Wt Readings from Last 3 Encounters:  06/29/22 184 lb 12.8 oz (83.8 kg)  06/24/22 184 lb 9.6 oz (83.7 kg)  06/08/22 186 lb (84.4 kg)      ASSESSMENT AND PLAN:   1 HFrEF   Pt with recent admit for weakness   Found to have new LV dysfunction with apical hypokinesis.    Currently on GDMT  Volume status looks good      Keep on same meds  Will get repeat echo later ths summer      2.   Hx HTN   BP is fair  FOllow     3   CAD   Minimal at cath  4  HL  Keep on Lipitor  5   Hx PE   Continue coumadin   Follow up in the fall   Current medicines are reviewed at length with the patient today.  The patient does not have concerns regarding medicines.  Signed, Dietrich Pates, MD  06/29/2022 7:06 PM    [facility] [address] [place], [place], [place]  [address] Phone: [phone]; Fax: [phone]

## 2022-06-30 LAB — BASIC METABOLIC PANEL
BUN/Creatinine Ratio: 12 (ref 12–28)
BUN: 17 mg/dL (ref 8–27)
CO2: 18 mmol/L — ABNORMAL LOW (ref 20–29)
Calcium: 9.7 mg/dL (ref 8.7–10.3)
Chloride: 105 mmol/L (ref 96–106)
Creatinine, Ser: 1.37 mg/dL — ABNORMAL HIGH (ref 0.57–1.00)
Glucose: 122 mg/dL — ABNORMAL HIGH (ref 70–99)
Potassium: 4.2 mmol/L (ref 3.5–5.2)
Sodium: 142 mmol/L (ref 134–144)
eGFR: 39 mL/min/{1.73_m2} — ABNORMAL LOW (ref >59)

## 2022-07-06 ENCOUNTER — Ambulatory Visit: Payer: Medicare Other | Attending: Cardiovascular Disease | Admitting: *Deleted

## 2022-07-06 DIAGNOSIS — I2699 Other pulmonary embolism without acute cor pulmonale: Secondary | ICD-10-CM

## 2022-07-06 DIAGNOSIS — Z5181 Encounter for therapeutic drug level monitoring: Secondary | ICD-10-CM

## 2022-07-06 LAB — POCT INR: INR: 6 — AB (ref 2.0–3.0)

## 2022-07-06 NOTE — Patient Instructions (Addendum)
Description   PLEASE START USING 2.5MG  GREEN TABLET. Do not take any warfarin today, no warfarin tomorrow, and no warfarin Friday then START taking Warfarin 2.5mg  (1 tablet) daily except 1.25mg  (1/2 tablet) on Sundays.  Keep a green leafy veggie in your diet 2 times a week.  Recheck INR in 1 week.  [facility] [phone].

## 2022-07-08 DIAGNOSIS — E1122 Type 2 diabetes mellitus with diabetic chronic kidney disease: Secondary | ICD-10-CM | POA: Diagnosis not present

## 2022-07-08 DIAGNOSIS — I130 Hypertensive heart and chronic kidney disease with heart failure and stage 1 through stage 4 chronic kidney disease, or unspecified chronic kidney disease: Secondary | ICD-10-CM | POA: Diagnosis not present

## 2022-07-08 DIAGNOSIS — N1831 Chronic kidney disease, stage 3a: Secondary | ICD-10-CM | POA: Diagnosis not present

## 2022-07-08 DIAGNOSIS — I5022 Chronic systolic (congestive) heart failure: Secondary | ICD-10-CM | POA: Diagnosis not present

## 2022-07-08 DIAGNOSIS — I214 Non-ST elevation (NSTEMI) myocardial infarction: Secondary | ICD-10-CM | POA: Diagnosis not present

## 2022-07-08 DIAGNOSIS — I480 Paroxysmal atrial fibrillation: Secondary | ICD-10-CM | POA: Diagnosis not present

## 2022-07-10 DIAGNOSIS — E119 Type 2 diabetes mellitus without complications: Secondary | ICD-10-CM | POA: Diagnosis not present

## 2022-07-10 DIAGNOSIS — K56609 Unspecified intestinal obstruction, unspecified as to partial versus complete obstruction: Secondary | ICD-10-CM | POA: Diagnosis not present

## 2022-07-10 DIAGNOSIS — Z933 Colostomy status: Secondary | ICD-10-CM | POA: Diagnosis not present

## 2022-07-10 DIAGNOSIS — Z4801 Encounter for change or removal of surgical wound dressing: Secondary | ICD-10-CM | POA: Diagnosis not present

## 2022-07-11 DIAGNOSIS — N1831 Chronic kidney disease, stage 3a: Secondary | ICD-10-CM | POA: Diagnosis not present

## 2022-07-11 DIAGNOSIS — I214 Non-ST elevation (NSTEMI) myocardial infarction: Secondary | ICD-10-CM | POA: Diagnosis not present

## 2022-07-11 DIAGNOSIS — I480 Paroxysmal atrial fibrillation: Secondary | ICD-10-CM | POA: Diagnosis not present

## 2022-07-11 DIAGNOSIS — E1122 Type 2 diabetes mellitus with diabetic chronic kidney disease: Secondary | ICD-10-CM | POA: Diagnosis not present

## 2022-07-11 DIAGNOSIS — I5022 Chronic systolic (congestive) heart failure: Secondary | ICD-10-CM | POA: Diagnosis not present

## 2022-07-11 DIAGNOSIS — I130 Hypertensive heart and chronic kidney disease with heart failure and stage 1 through stage 4 chronic kidney disease, or unspecified chronic kidney disease: Secondary | ICD-10-CM | POA: Diagnosis not present

## 2022-07-13 ENCOUNTER — Ambulatory Visit: Payer: Medicare Other | Attending: Internal Medicine | Admitting: *Deleted

## 2022-07-13 DIAGNOSIS — I480 Paroxysmal atrial fibrillation: Secondary | ICD-10-CM

## 2022-07-13 DIAGNOSIS — Z5181 Encounter for therapeutic drug level monitoring: Secondary | ICD-10-CM | POA: Diagnosis not present

## 2022-07-13 DIAGNOSIS — I2699 Other pulmonary embolism without acute cor pulmonale: Secondary | ICD-10-CM | POA: Diagnosis not present

## 2022-07-13 LAB — POCT INR: POC INR: 5

## 2022-07-13 NOTE — Patient Instructions (Signed)
Description   PLEASE START USING 2.5MG  GREEN TABLET. Do not take any warfarin today, no warfarin tomorrow then START taking Warfarin 2.5mg  (1 tablet) daily except 1.25mg  (1/2 tablet) on Sundays.  Keep a green leafy veggie in your diet 2 times a week.  Recheck INR in 1 week.  Coumadin Clinic [phone].

## 2022-07-15 DIAGNOSIS — Z4801 Encounter for change or removal of surgical wound dressing: Secondary | ICD-10-CM | POA: Diagnosis not present

## 2022-07-15 DIAGNOSIS — K56609 Unspecified intestinal obstruction, unspecified as to partial versus complete obstruction: Secondary | ICD-10-CM | POA: Diagnosis not present

## 2022-07-15 DIAGNOSIS — Z933 Colostomy status: Secondary | ICD-10-CM | POA: Diagnosis not present

## 2022-07-15 DIAGNOSIS — E119 Type 2 diabetes mellitus without complications: Secondary | ICD-10-CM | POA: Diagnosis not present

## 2022-07-21 ENCOUNTER — Emergency Department (HOSPITAL_COMMUNITY): Payer: Medicare Other

## 2022-07-21 ENCOUNTER — Other Ambulatory Visit: Payer: Self-pay

## 2022-07-21 ENCOUNTER — Emergency Department (HOSPITAL_COMMUNITY)
Admission: EM | Admit: 2022-07-21 | Discharge: 2022-07-22 | Disposition: A | Discharge status: Home / Self Care | Payer: Medicare Other | Attending: Emergency Medicine | Admitting: Emergency Medicine

## 2022-07-21 DIAGNOSIS — E872 Acidosis, unspecified: Secondary | ICD-10-CM

## 2022-07-21 DIAGNOSIS — S199XXA Unspecified injury of neck, initial encounter: Secondary | ICD-10-CM | POA: Diagnosis not present

## 2022-07-21 DIAGNOSIS — R531 Weakness: Secondary | ICD-10-CM | POA: Diagnosis not present

## 2022-07-21 DIAGNOSIS — N183 Chronic kidney disease, stage 3 unspecified: Secondary | ICD-10-CM | POA: Diagnosis not present

## 2022-07-21 DIAGNOSIS — Z7901 Long term (current) use of anticoagulants: Secondary | ICD-10-CM | POA: Insufficient documentation

## 2022-07-21 DIAGNOSIS — Z96642 Presence of left artificial hip joint: Secondary | ICD-10-CM | POA: Diagnosis not present

## 2022-07-21 DIAGNOSIS — I6523 Occlusion and stenosis of bilateral carotid arteries: Secondary | ICD-10-CM | POA: Diagnosis not present

## 2022-07-21 DIAGNOSIS — I700 Atherosclerosis of aorta: Secondary | ICD-10-CM | POA: Diagnosis not present

## 2022-07-21 DIAGNOSIS — Z79899 Other long term (current) drug therapy: Secondary | ICD-10-CM | POA: Diagnosis not present

## 2022-07-21 DIAGNOSIS — N289 Disorder of kidney and ureter, unspecified: Secondary | ICD-10-CM | POA: Diagnosis not present

## 2022-07-21 DIAGNOSIS — Z043 Encounter for examination and observation following other accident: Secondary | ICD-10-CM | POA: Diagnosis not present

## 2022-07-21 DIAGNOSIS — E8721 Acute metabolic acidosis: Secondary | ICD-10-CM | POA: Diagnosis not present

## 2022-07-21 DIAGNOSIS — W19XXXA Unspecified fall, initial encounter: Secondary | ICD-10-CM | POA: Diagnosis not present

## 2022-07-21 DIAGNOSIS — I509 Heart failure, unspecified: Secondary | ICD-10-CM | POA: Insufficient documentation

## 2022-07-21 DIAGNOSIS — S0990XA Unspecified injury of head, initial encounter: Secondary | ICD-10-CM | POA: Insufficient documentation

## 2022-07-21 DIAGNOSIS — E1122 Type 2 diabetes mellitus with diabetic chronic kidney disease: Secondary | ICD-10-CM | POA: Diagnosis not present

## 2022-07-21 DIAGNOSIS — I499 Cardiac arrhythmia, unspecified: Secondary | ICD-10-CM | POA: Diagnosis not present

## 2022-07-21 DIAGNOSIS — R9089 Other abnormal findings on diagnostic imaging of central nervous system: Secondary | ICD-10-CM | POA: Diagnosis not present

## 2022-07-21 DIAGNOSIS — I130 Hypertensive heart and chronic kidney disease with heart failure and stage 1 through stage 4 chronic kidney disease, or unspecified chronic kidney disease: Secondary | ICD-10-CM | POA: Insufficient documentation

## 2022-07-21 DIAGNOSIS — I10 Essential (primary) hypertension: Secondary | ICD-10-CM | POA: Diagnosis not present

## 2022-07-21 DIAGNOSIS — Z743 Need for continuous supervision: Secondary | ICD-10-CM | POA: Diagnosis not present

## 2022-07-21 LAB — COMPREHENSIVE METABOLIC PANEL
ALT: 15 U/L (ref 0–44)
AST: 30 U/L (ref 15–41)
Albumin: 3.3 g/dL — ABNORMAL LOW (ref 3.5–5.0)
Alkaline Phosphatase: 53 U/L (ref 38–126)
Anion gap: 17 — ABNORMAL HIGH (ref 5–15)
BUN: 18 mg/dL (ref 8–23)
CO2: 17 mmol/L — ABNORMAL LOW (ref 22–32)
Calcium: 9.3 mg/dL (ref 8.9–10.3)
Chloride: 102 mmol/L (ref 98–111)
Creatinine, Ser: 1.57 mg/dL — ABNORMAL HIGH (ref 0.44–1.00)
GFR, Estimated: 33 mL/min — ABNORMAL LOW (ref >60)
Glucose, Bld: 127 mg/dL — ABNORMAL HIGH (ref 70–99)
Potassium: 5.1 mmol/L (ref 3.5–5.1)
Sodium: 136 mmol/L (ref 135–145)
Total Bilirubin: 2.1 mg/dL — ABNORMAL HIGH (ref 0.3–1.2)
Total Protein: 6.6 g/dL (ref 6.5–8.1)

## 2022-07-21 LAB — CBC
HCT: 49.9 % — ABNORMAL HIGH (ref 36.0–46.0)
Hemoglobin: 15.6 g/dL — ABNORMAL HIGH (ref 12.0–15.0)
MCH: 28.4 pg (ref 26.0–34.0)
MCHC: 31.3 g/dL (ref 30.0–36.0)
MCV: 90.7 fL (ref 80.0–100.0)
Platelets: 184 10*3/uL (ref 150–400)
RBC: 5.5 MIL/uL — ABNORMAL HIGH (ref 3.87–5.11)
RDW: 19.8 % — ABNORMAL HIGH (ref 11.5–15.5)
WBC: 12.6 10*3/uL — ABNORMAL HIGH (ref 4.0–10.5)
nRBC: 0 % (ref 0.0–0.2)

## 2022-07-21 LAB — PROTIME-INR
INR: 2.6 — ABNORMAL HIGH (ref 0.8–1.2)
Prothrombin Time: 27.9 seconds — ABNORMAL HIGH (ref 11.4–15.2)

## 2022-07-21 LAB — CK TOTAL AND CKMB (NOT AT ARMC)
CK, MB: 6.4 ng/mL — ABNORMAL HIGH (ref 0.5–5.0)
Total CK: 499 U/L — ABNORMAL HIGH (ref 38–234)

## 2022-07-21 MED ORDER — LACTATED RINGERS IV BOLUS
500.0000 mL | Freq: Once | INTRAVENOUS | Status: AC
  Administered 2022-07-21: 500 mL via INTRAVENOUS

## 2022-07-21 MED ORDER — LACTATED RINGERS IV BOLUS: 1000.0000 mL | Freq: Once | INTRAVENOUS | Status: DC

## 2022-07-21 NOTE — Progress Notes (Signed)
Orthopedic Tech Progress Note Patient Details:  Hannah [name] December 25, 1940 [id number]  Level II trauma. Ortho tech present upon pt arrival and not needed at this time.  Patient ID: Hannah [name], female   DOB: 11/22/1940, 82 y.o.   MRN: [id number]  Docia Furl 07/21/2022, 8:22 PM

## 2022-07-21 NOTE — ED Notes (Signed)
Trauma Response Nurse Documentation   Hannah [name] is a 82 y.o. female arriving to [facility] ED via EMS  On coumadin. Trauma was activated as a Level 2 by ED charge RN based on the following trauma criteria Elderly patients > 65 with head trauma on anti-coagulation (excluding ASA).  Patient cleared for CT by Dr. Earlene Plater EDP. Pt transported to CT with trauma response nurse present to monitor. RN remained with the patient throughout their absence from the department for clinical observation.   GCS 15.  History   Past Medical History:  Diagnosis Date   Acute massive pulmonary embolism (HCC) 07/13/2012   Massive PE w/ PEA arrest 07/13/12 >TNK >IVC filter >discharged on comadin     Anticoagulated on warfarin    Chronic diastolic CHF (congestive heart failure) (HCC) 03/15/2017   Chronic kidney disease, stage 3a (HCC) 12/15/2018   Diabetes mellitus, type 2 (HCC) 07/15/2012   with peripheral neuropathy   DM2 (diabetes mellitus, type 2) (HCC) 07/23/2012   HFrEF (heart failure with reduced ejection fraction) (HCC) 03/15/2017   Non-ischemic cardiomyopathy // Cardiac catheterization 01/01/21:  RCA prox to mid 20 // Echocardiogram 12/26/20:  EF 30-35, ant septum/apex, mid inf-sept AK; ant-lat/ant/inf HK, Gr 1 DD, mild LVH, normal RVSF, RVSP 27.2, small effusion (ant to RV), mild MR, mod TR, AV sclerosis w/o AS   Hyperlipemia 07/14/2012   Hyperlipidemia 02/25/2013   Hypertension 07/14/2012   Hypothyroid 06/06/2013   Large bowel stricture (HCC)    s/p colectomy in 2016   Neuropathy 02/12/2015   Osteoarthritis    s/p hip and knee replacements   PAF (paroxysmal atrial fibrillation) (HCC)    Pulmonary hypertension (HCC) 07/26/2016   Syncope 10/2018   Thyroid disease      Past Surgical History:  Procedure Laterality Date   COLONOSCOPY N/A 03/12/2016   Procedure: COLONOSCOPY;  Surgeon: Dorena Cookey, MD;  Location[facility] ENDOSCOPY;  Service: Endoscopy;  Laterality: N/A;   COLOSTOMY N/A 06/03/2014   Procedure:  COLOSTOMY;  Surgeon: Harriette Bouillon, MD;  Location: [facility] OR;  Service: General;  Laterality: N/A;   FLEXIBLE SIGMOIDOSCOPY N/A 05/30/2014   Procedure: Arnell Sieving;  Surgeon: Jeani Hawking, MD;  Location: [facility] ENDOSCOPY;  Service: Endoscopy;  Laterality: N/A;   INSERTION OF VENA CAVA FILTER N/A 07/16/2012   Procedure: INSERTION OF VENA CAVA FILTER;  Surgeon: [name], MD;  Location: [facility] CATH LAB;  Service: Cardiovascular;  Laterality: N/A;   KNEE ARTHROSCOPY     LEFT HEART CATH AND CORONARY ANGIOGRAPHY N/A 01/01/2021   Procedure: LEFT HEART CATH AND CORONARY ANGIOGRAPHY;  Surgeon: Kathleene Hazel, MD;  Location: [facility] INVASIVE CV LAB;  Service: Cardiovascular;  Laterality: N/A;   LEFT HEART CATH AND CORONARY ANGIOGRAPHY N/A 04/29/2022   Procedure: LEFT HEART CATH AND CORONARY ANGIOGRAPHY;  Surgeon: Orbie Pyo, MD;  Location: [facility] INVASIVE CV LAB;  Service: Cardiovascular;  Laterality: N/A;   PARTIAL COLECTOMY N/A 06/03/2014   Procedure: PARTIAL COLECTOMY;  Surgeon: Harriette Bouillon, MD;  Location: [facility] OR;  Service: General;  Laterality: N/A;   REDUCTION MAMMAPLASTY Bilateral    RIGHT HEART CATH N/A 12/30/2020   Procedure: RIGHT HEART CATH;  Surgeon: Kathleene Hazel, MD;  Location: [facility] INVASIVE CV LAB;  Service: Cardiovascular;  Laterality: N/A;   SP ARTHRO HIP*L*         Initial Focused Assessment (If applicable, or please see trauma documentation): Alert/oriented female presents via EMS from home after fall this morning in bathroom, has been on the  floor all day. Found by family just PTA. Reports laying on her left side, some pressure injury noted to left arm. Denies pain, alertx4  Airway patent/unobstructed, BS clear No obvious uncontrolled hemorrhage GCS 15 PERRLA 3mm  CT's Completed:   CT Head and CT C-Spine   Interventions:  Trauma lab draw Attempt x2 to obtain IV without success CT head and cervical Portable chest and pelvis XRAY EKG  Plan for  disposition:  Pending workup  Consults completed:  None  Event Summary: Presents via EMS from home after a fall this morning around 0800, reports hitting her head. No obvious wounds noted. Denies pain. Escorted to CT prior to XRAY d/t hallway bed. Moved to treatment room for EKG/XRAYS/assessment. Pending family arrival/workup for dispo.  MTP Summary (If applicable): NA  Bedside handoff with ED RN Hannah [name].    Hannah [name]  Trauma Response RN  Please call TRN at [phone] for further assistance.

## 2022-07-21 NOTE — ED Triage Notes (Signed)
Pt BIB [facility] from home. Pt was using the restroom at 0800. Pt fell getting up from toilet hitting head on the wall when she fell. Pt was unable to get up by self; family found her tonight around 66. Pt denies pain. Pt takes coumadin. A/ox4 on arrival.

## 2022-07-21 NOTE — ED Provider Notes (Signed)
Care assumed form Dr. Earlene Plater, patient with fall. CT is negative. Labs show metabolic acidosis, slight increase in creatinine. She is getting IV fluids and repeat basic metabolic panel.  Repeat BMP show essentially stable CO2, improvement in creatinine. On further review of her records, it is noted that she has had low CO2 for the last two months. She does feel better after hydration. She is safe for discharge, will need to follow up with her PCP about persistent metabolic acidosis.  Results for orders placed or performed during the hospital encounter of 07/21/22  Comprehensive metabolic panel  Result Value Ref Range   Sodium 136 135 - 145 mmol/L   Potassium 5.1 3.5 - 5.1 mmol/L   Chloride 102 98 - 111 mmol/L   CO2 17 (L) 22 - 32 mmol/L   Glucose, Bld 127 (H) 70 - 99 mg/dL   BUN 18 8 - 23 mg/dL   Creatinine, Ser 1.61 (H) 0.44 - 1.00 mg/dL   Calcium 9.3 8.9 - 09.6 mg/dL   Total Protein 6.6 6.5 - 8.1 g/dL   Albumin 3.3 (L) 3.5 - 5.0 g/dL   AST 30 15 - 41 U/L   ALT 15 0 - 44 U/L   Alkaline Phosphatase 53 38 - 126 U/L   Total Bilirubin 2.1 (H) 0.3 - 1.2 mg/dL   GFR, Estimated 33 (L) >60 mL/min   Anion gap 17 (H) 5 - 15  CBC  Result Value Ref Range   WBC 12.6 (H) 4.0 - 10.5 K/uL   RBC 5.50 (H) 3.87 - 5.11 MIL/uL   Hemoglobin 15.6 (H) 12.0 - 15.0 g/dL   HCT 04.5 (H) 40.9 - 81.1 %   MCV 90.7 80.0 - 100.0 fL   MCH 28.4 26.0 - 34.0 pg   MCHC 31.3 30.0 - 36.0 g/dL   RDW 91.4 (H) 78.2 - 95.6 %   Platelets 184 150 - 400 K/uL   nRBC 0.0 0.0 - 0.2 %  Urinalysis, Routine w reflex microscopic -Urine, Clean Catch  Result Value Ref Range   Color, Urine YELLOW YELLOW   APPearance HAZY (A) CLEAR   Specific Gravity, Urine 1.015 1.005 - 1.030   pH 5.0 5.0 - 8.0   Glucose, UA >=500 (A) NEGATIVE mg/dL   Hgb urine dipstick MODERATE (A) NEGATIVE   Bilirubin Urine NEGATIVE NEGATIVE   Ketones, ur 80 (A) NEGATIVE mg/dL   Protein, ur 30 (A) NEGATIVE mg/dL   Nitrite NEGATIVE NEGATIVE   Leukocytes,Ua  NEGATIVE NEGATIVE   RBC / HPF 0-5 0 - 5 RBC/hpf   WBC, UA 0-5 0 - 5 WBC/hpf   Bacteria, UA RARE (A) NONE SEEN   Squamous Epithelial / HPF 11-20 0 - 5 /HPF   Mucus PRESENT   Protime-INR  Result Value Ref Range   Prothrombin Time 27.9 (H) 11.4 - 15.2 seconds   INR 2.6 (H) 0.8 - 1.2  CK total and CKMB  Result Value Ref Range   Total CK 499 (H) 38 - 234 U/L   CK, MB 6.4 (H) 0.5 - 5.0 ng/mL   DG Pelvis Portable  Result Date: 07/21/2022 CLINICAL DATA:  Fall EXAM: PORTABLE PELVIS 1-2 VIEWS COMPARISON:  X-ray pelvis 02/10/2022 FINDINGS: Limited evaluation due to overlapping osseous structures and overlying soft tissues. Total left hip arthroplasty partially visualized. No radiographic findings suggest surgical hardware complication. Frontal view of the right hip unremarkable with no acute displaced fracture or dislocation. There is no evidence of pelvic fracture or diastasis. No pelvic bone lesions are  seen. Inferior vena cava filter noted overlying the lower lumbar spine. IMPRESSION: Negative for acute traumatic injury. Electronically Signed   By: Tish Frederickson M.D.   On: 07/21/2022 21:22   DG Chest Portable 1 View  Result Date: 07/21/2022 CLINICAL DATA:  Fall EXAM: PORTABLE CHEST 1 VIEW COMPARISON:  CT chest 12/15/2018, CT chest 02/10/2022 FINDINGS: Aortic calcification. The heart and mediastinal contours are within normal limits. No focal consolidation. No pulmonary edema. No pleural effusion. No pneumothorax. No acute osseous abnormality. IMPRESSION: 1. No active disease. 2.  Aortic Atherosclerosis (ICD10-I70.0). Electronically Signed   By: Tish Frederickson M.D.   On: 07/21/2022 21:21   CT HEAD WO CONTRAST  Result Date: 07/21/2022 CLINICAL DATA:  Head trauma, moderate-severe; Polytrauma, blunt EXAM: CT HEAD WITHOUT CONTRAST CT CERVICAL SPINE WITHOUT CONTRAST TECHNIQUE: Multidetector CT imaging of the head and cervical spine was performed following the standard protocol without intravenous  contrast. Multiplanar CT image reconstructions of the cervical spine were also generated. RADIATION DOSE REDUCTION: This exam was performed according to the departmental dose-optimization program which includes automated exposure control, adjustment of the mA and/or kV according to patient size and/or use of iterative reconstruction technique. COMPARISON:  CT head 04/24/2022 FINDINGS: CT HEAD FINDINGS Brain: Cerebral ventricle sizes are concordant with the degree of cerebral volume loss. Patchy and confluent areas of decreased attenuation are noted throughout the deep and periventricular white matter of the cerebral hemispheres bilaterally, compatible with chronic microvascular ischemic disease. No evidence of large-territorial acute infarction. No parenchymal hemorrhage. No mass lesion. No extra-axial collection. No mass effect or midline shift. No hydrocephalus. Basilar cisterns are patent. Vascular: No hyperdense vessel. Atherosclerotic calcifications are present within the cavernous internal carotid arteries. Skull: No acute fracture or focal lesion. Sinuses/Orbits: Paranasal sinuses and mastoid air cells are clear. The orbits are unremarkable. Other: None. CT CERVICAL SPINE FINDINGS Alignment: Normal. Skull base and vertebrae: Multilevel severe degenerative changes of the spine with posterior longitudinal ligament calcification leading to least mild to moderate small osseous central canal stenosis at the C4 through C6 levels. Multilevel at least moderate osseous neural foraminal stenosis. No acute fracture. No aggressive appearing focal osseous lesion or focal pathologic process. Soft tissues and spinal canal: No prevertebral fluid or swelling. No visible canal hematoma. Upper chest: Unremarkable. Other: None. IMPRESSION: 1. No acute intracranial abnormality in a patient with severe diffuse atrophy and chronic microvascular ischemic changes. 2. No acute displaced fracture or traumatic listhesis of the cervical  spine. Electronically Signed   By: Tish Frederickson M.D.   On: 07/21/2022 20:49   CT CERVICAL SPINE WO CONTRAST  Result Date: 07/21/2022 CLINICAL DATA:  Head trauma, moderate-severe; Polytrauma, blunt EXAM: CT HEAD WITHOUT CONTRAST CT CERVICAL SPINE WITHOUT CONTRAST TECHNIQUE: Multidetector CT imaging of the head and cervical spine was performed following the standard protocol without intravenous contrast. Multiplanar CT image reconstructions of the cervical spine were also generated. RADIATION DOSE REDUCTION: This exam was performed according to the departmental dose-optimization program which includes automated exposure control, adjustment of the mA and/or kV according to patient size and/or use of iterative reconstruction technique. COMPARISON:  CT head 04/24/2022 FINDINGS: CT HEAD FINDINGS Brain: Cerebral ventricle sizes are concordant with the degree of cerebral volume loss. Patchy and confluent areas of decreased attenuation are noted throughout the deep and periventricular white matter of the cerebral hemispheres bilaterally, compatible with chronic microvascular ischemic disease. No evidence of large-territorial acute infarction. No parenchymal hemorrhage. No mass lesion. No extra-axial collection. No mass effect or  midline shift. No hydrocephalus. Basilar cisterns are patent. Vascular: No hyperdense vessel. Atherosclerotic calcifications are present within the cavernous internal carotid arteries. Skull: No acute fracture or focal lesion. Sinuses/Orbits: Paranasal sinuses and mastoid air cells are clear. The orbits are unremarkable. Other: None. CT CERVICAL SPINE FINDINGS Alignment: Normal. Skull base and vertebrae: Multilevel severe degenerative changes of the spine with posterior longitudinal ligament calcification leading to least mild to moderate small osseous central canal stenosis at the C4 through C6 levels. Multilevel at least moderate osseous neural foraminal stenosis. No acute fracture. No  aggressive appearing focal osseous lesion or focal pathologic process. Soft tissues and spinal canal: No prevertebral fluid or swelling. No visible canal hematoma. Upper chest: Unremarkable. Other: None. IMPRESSION: 1. No acute intracranial abnormality in a patient with severe diffuse atrophy and chronic microvascular ischemic changes. 2. No acute displaced fracture or traumatic listhesis of the cervical spine. Electronically Signed   By: Tish Frederickson M.D.   On: 07/21/2022 20:49      Dione Booze, MD 07/22/22 0500

## 2022-07-21 NOTE — ED Provider Notes (Signed)
[facility]PARTMENT AT [facility] Provider Note   CSN: [id number] Arrival date & time: 07/21/22  2016     History  Chief Complaint  Patient presents with   fall on thinners    Kaicee Scarpino is a 82 y.o. female.  HPI 82 year old female history of HFrEF, CKD 3, pulmonary hypertension, type 2 diabetes, hypertension, prior PE on Coumadin presenting for fall.  Patient states earlier today she was in the bathroom.  Her lights went out and she lost her balance do not be able to see and fell.  Unsure if she hit her head.  No syncope.  She had no presyncopal symptoms or loss of consciousness.  Previous to this she was asymptomatic.  Unfortunately she has some difficulty with ambulation at baseline and uses a walker.  She has home health care at home and feels safe at home and is hopeful to go home tonight.  However she was not able to get up on her own so she was on the floor for period of time.  Currently she feels well.  She has no pain including no headache, neck pain, chest pain, back pain, abdominal pain.  No shortness of breath, fever or recent illness.  She has otherwise been at her baseline health.     Home Medications Prior to Admission medications   Medication Sig Start Date End Date Taking? Authorizing Provider  acetaminophen (TYLENOL) 500 MG tablet Take 1,000 mg by mouth every 6 (six) hours as needed for mild pain.    [provider]  atorvastatin (LIPITOR) 40 MG tablet Take 1 tablet (40 mg total) by mouth daily. 03/03/22   [name], MD  dapagliflozin propanediol (FARXIGA) 10 MG TABS tablet Take 1 tablet (10 mg total) by mouth daily before breakfast. 12/23/21   Pricilla Riffle, MD  diclofenac Sodium (VOLTAREN ARTHRITIS PAIN) 1 % GEL Apply 2 g topically as needed.    [provider]  furosemide (LASIX) 20 MG tablet Take 1 tablet (20 mg total) by mouth daily as needed for fluid or edema. 03/10/21   Wynn Banker, MD  glucose blood  (ONETOUCH VERIO) test strip USE 1 STRIP TO CHECK GLUCOSE ONCE DAILY AS DIRECTED 05/13/22   [name], MD  metoprolol succinate (TOPROL XL) 25 MG 24 hr tablet Take 1 tablet (25 mg total) by mouth daily. 06/08/22 06/08/23  Clegg, Amy D, NP  nitroGLYCERIN (NITROSTAT) 0.4 MG SL tablet Place 1 tablet (0.4 mg total) under the tongue every 5 (five) minutes as needed for chest pain. 04/30/22   Willeen Niece, MD  OneTouch Delica Lancets 30G MISC USE 1  TO CHECK GLUCOSE ONCE DAILY 05/13/22   [name], MD  pantoprazole (PROTONIX) 40 MG tablet Take 1 tablet (40 mg total) by mouth daily. 02/26/21   Koberlein, Paris Lore, MD  sacubitril-valsartan (ENTRESTO) 24-26 MG Take 1 tablet by mouth 2 (two) times daily. 12/14/21   [name], MD  spironolactone (ALDACTONE) 25 MG tablet Take 0.5 tablets (12.5 mg total) by mouth daily. 06/27/22   Andrey Farmer, PA-C  vitamin B-12 (CYANOCOBALAMIN) 1000 MCG tablet Take 1 tablet (1,000 mcg total) by mouth daily. 05/15/20   Wynn Banker, MD  vitamin C (ASCORBIC ACID) 500 MG tablet Take 500 mg by mouth daily.     [provider]  warfarin (COUMADIN) 2.5 MG tablet Take 1 tablet (2.5 mg total) by mouth daily. 06/29/22   Pricilla Riffle, MD  Allergies    Augmentin [amoxicillin-pot clavulanate], Tranxene [clorazepate], Crestor [rosuvastatin], and Penicillins    Review of Systems   Review of Systems Review of systems completed and notable as per HPI.  ROS otherwise negative.   Physical Exam Updated Vital Signs BP 121/68   Pulse 91   Temp 98.1 F (36.7 C) (Oral)   Resp (!) 22   Ht 5\' 7"  (1.702 m)   Wt 84 kg   SpO2 97%   BMI 29.00 kg/m  Physical Exam Vitals and nursing note reviewed.  Constitutional:      General: She is not in acute distress.    Appearance: She is well-developed.  HENT:     Head: Normocephalic and atraumatic.     Nose: Nose normal.     Mouth/Throat:     Mouth: Mucous membranes are moist.     Pharynx:  Oropharynx is clear. No oropharyngeal exudate or posterior oropharyngeal erythema.  Eyes:     Extraocular Movements: Extraocular movements intact.     Conjunctiva/sclera: Conjunctivae normal.     Pupils: Pupils are equal, round, and reactive to light.  Cardiovascular:     Rate and Rhythm: Normal rate and regular rhythm.     Heart sounds: No murmur heard. Pulmonary:     Effort: Pulmonary effort is normal. No respiratory distress.     Breath sounds: Normal breath sounds.  Abdominal:     Palpations: Abdomen is soft.     Tenderness: There is no abdominal tenderness.  Musculoskeletal:        General: No swelling.     Cervical back: Normal range of motion and neck supple. No rigidity or tenderness.     Right lower leg: No edema.     Left lower leg: No edema.  Skin:    General: Skin is warm and dry.     Capillary Refill: Capillary refill takes less than 2 seconds.  Neurological:     General: No focal deficit present.     Mental Status: She is alert and oriented to person, place, and time. Mental status is at baseline.     Cranial Nerves: No cranial nerve deficit.     Sensory: No sensory deficit.     Motor: No weakness.  Psychiatric:        Mood and Affect: Mood normal.     ED Results / Procedures / Treatments   Labs (all labs ordered are listed, but only abnormal results are displayed) Labs Reviewed  COMPREHENSIVE METABOLIC PANEL - Abnormal; Notable for the following components:      Result Value   CO2 17 (*)    Glucose, Bld 127 (*)    Creatinine, Ser 1.57 (*)    Albumin 3.3 (*)    Total Bilirubin 2.1 (*)    GFR, Estimated 33 (*)    Anion gap 17 (*)    All other components within normal limits  CBC - Abnormal; Notable for the following components:   WBC 12.6 (*)    RBC 5.50 (*)    Hemoglobin 15.6 (*)    HCT 49.9 (*)    RDW 19.8 (*)    All other components within normal limits  PROTIME-INR - Abnormal; Notable for the following components:   Prothrombin Time 27.9 (*)     INR 2.6 (*)    All other components within normal limits  CK TOTAL AND CKMB (NOT AT [facility]) - Abnormal; Notable for the following components:   Total CK 499 (*)    CK,  MB 6.4 (*)    All other components within normal limits  URINALYSIS, ROUTINE W REFLEX MICROSCOPIC  BASIC METABOLIC PANEL  CK    EKG EKG Interpretation Date/Time:  Thursday July 21 2022 21:04:12 EDT Ventricular Rate:  93 PR Interval:  160 QRS Duration:  122 QT Interval:  363 QTC Calculation: 452 R Axis:   -70  Text Interpretation: Sinus rhythm Nonspecific IVCD with LAD Left ventricular hypertrophy No significant change since last tracing Confirmed by Fulton Reek [phone]) on 07/21/2022 11:46:04 PM  Radiology DG Pelvis Portable  Result Date: 07/21/2022 CLINICAL DATA:  Fall EXAM: PORTABLE PELVIS 1-2 VIEWS COMPARISON:  X-ray pelvis 02/10/2022 FINDINGS: Limited evaluation due to overlapping osseous structures and overlying soft tissues. Total left hip arthroplasty partially visualized. No radiographic findings suggest surgical hardware complication. Frontal view of the right hip unremarkable with no acute displaced fracture or dislocation. There is no evidence of pelvic fracture or diastasis. No pelvic bone lesions are seen. Inferior vena cava filter noted overlying the lower lumbar spine. IMPRESSION: Negative for acute traumatic injury. Electronically Signed   By: Tish Frederickson M.D.   On: 07/21/2022 21:22   DG Chest Portable 1 View  Result Date: 07/21/2022 CLINICAL DATA:  Fall EXAM: PORTABLE CHEST 1 VIEW COMPARISON:  CT chest 12/15/2018, CT chest 02/10/2022 FINDINGS: Aortic calcification. The heart and mediastinal contours are within normal limits. No focal consolidation. No pulmonary edema. No pleural effusion. No pneumothorax. No acute osseous abnormality. IMPRESSION: 1. No active disease. 2.  Aortic Atherosclerosis (ICD10-I70.0). Electronically Signed   By: Tish Frederickson M.D.   On: 07/21/2022 21:21   CT HEAD WO  CONTRAST  Result Date: 07/21/2022 CLINICAL DATA:  Head trauma, moderate-severe; Polytrauma, blunt EXAM: CT HEAD WITHOUT CONTRAST CT CERVICAL SPINE WITHOUT CONTRAST TECHNIQUE: Multidetector CT imaging of the head and cervical spine was performed following the standard protocol without intravenous contrast. Multiplanar CT image reconstructions of the cervical spine were also generated. RADIATION DOSE REDUCTION: This exam was performed according to the departmental dose-optimization program which includes automated exposure control, adjustment of the mA and/or kV according to patient size and/or use of iterative reconstruction technique. COMPARISON:  CT head 04/24/2022 FINDINGS: CT HEAD FINDINGS Brain: Cerebral ventricle sizes are concordant with the degree of cerebral volume loss. Patchy and confluent areas of decreased attenuation are noted throughout the deep and periventricular white matter of the cerebral hemispheres bilaterally, compatible with chronic microvascular ischemic disease. No evidence of large-territorial acute infarction. No parenchymal hemorrhage. No mass lesion. No extra-axial collection. No mass effect or midline shift. No hydrocephalus. Basilar cisterns are patent. Vascular: No hyperdense vessel. Atherosclerotic calcifications are present within the cavernous internal carotid arteries. Skull: No acute fracture or focal lesion. Sinuses/Orbits: Paranasal sinuses and mastoid air cells are clear. The orbits are unremarkable. Other: None. CT CERVICAL SPINE FINDINGS Alignment: Normal. Skull base and vertebrae: Multilevel severe degenerative changes of the spine with posterior longitudinal ligament calcification leading to least mild to moderate small osseous central canal stenosis at the C4 through C6 levels. Multilevel at least moderate osseous neural foraminal stenosis. No acute fracture. No aggressive appearing focal osseous lesion or focal pathologic process. Soft tissues and spinal canal: No  prevertebral fluid or swelling. No visible canal hematoma. Upper chest: Unremarkable. Other: None. IMPRESSION: 1. No acute intracranial abnormality in a patient with severe diffuse atrophy and chronic microvascular ischemic changes. 2. No acute displaced fracture or traumatic listhesis of the cervical spine. Electronically Signed   By: Normajean Glasgow.D.  On: 07/21/2022 20:49   CT CERVICAL SPINE WO CONTRAST  Result Date: 07/21/2022 CLINICAL DATA:  Head trauma, moderate-severe; Polytrauma, blunt EXAM: CT HEAD WITHOUT CONTRAST CT CERVICAL SPINE WITHOUT CONTRAST TECHNIQUE: Multidetector CT imaging of the head and cervical spine was performed following the standard protocol without intravenous contrast. Multiplanar CT image reconstructions of the cervical spine were also generated. RADIATION DOSE REDUCTION: This exam was performed according to the departmental dose-optimization program which includes automated exposure control, adjustment of the mA and/or kV according to patient size and/or use of iterative reconstruction technique. COMPARISON:  CT head 04/24/2022 FINDINGS: CT HEAD FINDINGS Brain: Cerebral ventricle sizes are concordant with the degree of cerebral volume loss. Patchy and confluent areas of decreased attenuation are noted throughout the deep and periventricular white matter of the cerebral hemispheres bilaterally, compatible with chronic microvascular ischemic disease. No evidence of large-territorial acute infarction. No parenchymal hemorrhage. No mass lesion. No extra-axial collection. No mass effect or midline shift. No hydrocephalus. Basilar cisterns are patent. Vascular: No hyperdense vessel. Atherosclerotic calcifications are present within the cavernous internal carotid arteries. Skull: No acute fracture or focal lesion. Sinuses/Orbits: Paranasal sinuses and mastoid air cells are clear. The orbits are unremarkable. Other: None. CT CERVICAL SPINE FINDINGS Alignment: Normal. Skull base and  vertebrae: Multilevel severe degenerative changes of the spine with posterior longitudinal ligament calcification leading to least mild to moderate small osseous central canal stenosis at the C4 through C6 levels. Multilevel at least moderate osseous neural foraminal stenosis. No acute fracture. No aggressive appearing focal osseous lesion or focal pathologic process. Soft tissues and spinal canal: No prevertebral fluid or swelling. No visible canal hematoma. Upper chest: Unremarkable. Other: None. IMPRESSION: 1. No acute intracranial abnormality in a patient with severe diffuse atrophy and chronic microvascular ischemic changes. 2. No acute displaced fracture or traumatic listhesis of the cervical spine. Electronically Signed   By: Tish Frederickson M.D.   On: 07/21/2022 20:49    Procedures Procedures    Medications Ordered in ED Medications  lactated ringers bolus 1,000 mL (has no administration in time range)    ED Course/ Medical Decision Making/ A&P                             Medical Decision Making Amount and/or Complexity of Data Reviewed Labs: ordered. Radiology: ordered.   Medical Decision Making:   Letecia Arps is a 82 y.o. female who presented to the ED today with mechanical fall.  Vital signs reviewed.  On exam she is well-appearing, she is currently asymptomatic.  She was on the floor for period of time, will obtain lab workup.  Fall seems mechanical after the lights went out, no preceding presyncope or syncope and she appears otherwise to be at her baseline health.  Will obtain lab workup.  Will obtain CT head and cervical spine given unsure if she hit her head.  She is anticoagulated.   Patient placed on continuous vitals and telemetry monitoring while in ED which was reviewed periodically.  Reviewed and confirmed nursing documentation for past medical history, family history, social history.  Initial Study Results:   Laboratory  All laboratory results reviewed.  Labs  notable for mild non-anion gap metabolic acidosis, mild creatinine elevation, mild leukocytosis.  CK slightly elevated.  EKG EKG was reviewed independently.  No significant change from prior, no signs of acute ischemia or arrhythmia.  Radiology:  All images reviewed independently.  No evidence of traumatic injury.  Agree with radiology report at this time.    Reassessment and Plan:   On reassessment patient remained stable.  She remains asymptomatic.  She has slight CK elevation and mild creatinine elevation likely related to being on the ground for prolonged period of time.  She does have history of HFrEF, but does have very poor p.o. intake today.  Will give small dose of IV fluids and recheck her BMP and CK.  Her EKG she does not show any signs of ischemia or arrhythmia, low concern for ACS, PE.  She has no deficits on exam, low concern for CVA.  Will obtain urinalysis as well to rule out infection given leukocytosis.  Handoff was given to Dr. Preston Fleeting at 11:30 PM will plan to follow-up repeat labs and reassess.  She will need ambulation trial prior to possible discharge.   Patient's presentation is most consistent with acute presentation with potential threat to life or bodily function.           Final Clinical Impression(s) / ED Diagnoses Final diagnoses:  Fall, initial encounter    Rx / DC Orders ED Discharge Orders     None         Laurence Spates, MD 07/21/22 2348

## 2022-07-22 ENCOUNTER — Ambulatory Visit: Payer: Medicare Other

## 2022-07-22 ENCOUNTER — Ambulatory Visit (INDEPENDENT_AMBULATORY_CARE_PROVIDER_SITE_OTHER): Payer: Medicare Other | Admitting: Cardiology

## 2022-07-22 ENCOUNTER — Ambulatory Visit: Payer: Self-pay | Admitting: *Deleted

## 2022-07-22 DIAGNOSIS — Z5181 Encounter for therapeutic drug level monitoring: Secondary | ICD-10-CM | POA: Diagnosis not present

## 2022-07-22 LAB — BASIC METABOLIC PANEL
Anion gap: 18 — ABNORMAL HIGH (ref 5–15)
BUN: 19 mg/dL (ref 8–23)
CO2: 17 mmol/L — ABNORMAL LOW (ref 22–32)
Calcium: 8.9 mg/dL (ref 8.9–10.3)
Chloride: 102 mmol/L (ref 98–111)
Creatinine, Ser: 1.35 mg/dL — ABNORMAL HIGH (ref 0.44–1.00)
GFR, Estimated: 39 mL/min — ABNORMAL LOW (ref >60)
Glucose, Bld: 117 mg/dL — ABNORMAL HIGH (ref 70–99)
Potassium: 4.8 mmol/L (ref 3.5–5.1)
Sodium: 137 mmol/L (ref 135–145)

## 2022-07-22 LAB — URINALYSIS, ROUTINE W REFLEX MICROSCOPIC
Bilirubin Urine: NEGATIVE
Glucose, UA: >=500 mg/dL — AB
Ketones, ur: 80 mg/dL — AB
Leukocytes,Ua: NEGATIVE
Nitrite: NEGATIVE
Protein, ur: 30 mg/dL — AB
Specific Gravity, Urine: 1.015 (ref 1.005–1.030)
pH: 5 (ref 5.0–8.0)

## 2022-07-22 LAB — CK: Total CK: 451 U/L — ABNORMAL HIGH (ref 38–234)

## 2022-07-22 NOTE — Chronic Care Management (AMB) (Signed)
   07/22/2022  Hannah [name] Aug 16, 1940 [id number]   Erroneous encounter

## 2022-07-22 NOTE — Discharge Instructions (Addendum)
Make sure you are drinking enough fluids.  Return to the emergency department if you are having any problems.

## 2022-07-22 NOTE — ED Notes (Signed)
Informed Pt daughter that Pt is discharge. Daughter said she will come and pick up pt. Pt was discharged to waiting room in stable condition, alert and oriented waiting on daughter.

## 2022-07-24 ENCOUNTER — Emergency Department (HOSPITAL_COMMUNITY)
Admission: EM | Admit: 2022-07-24 | Discharge: 2022-07-24 | Disposition: A | Discharge status: Home / Self Care | Payer: Medicare Other | Attending: Emergency Medicine | Admitting: Emergency Medicine

## 2022-07-24 ENCOUNTER — Other Ambulatory Visit: Payer: Self-pay

## 2022-07-24 ENCOUNTER — Emergency Department (HOSPITAL_COMMUNITY): Payer: Medicare Other

## 2022-07-24 DIAGNOSIS — Z7901 Long term (current) use of anticoagulants: Secondary | ICD-10-CM | POA: Insufficient documentation

## 2022-07-24 DIAGNOSIS — G319 Degenerative disease of nervous system, unspecified: Secondary | ICD-10-CM | POA: Diagnosis not present

## 2022-07-24 DIAGNOSIS — E785 Hyperlipidemia, unspecified: Secondary | ICD-10-CM | POA: Diagnosis not present

## 2022-07-24 DIAGNOSIS — I447 Left bundle-branch block, unspecified: Secondary | ICD-10-CM | POA: Insufficient documentation

## 2022-07-24 DIAGNOSIS — M25552 Pain in left hip: Secondary | ICD-10-CM | POA: Insufficient documentation

## 2022-07-24 DIAGNOSIS — R29818 Other symptoms and signs involving the nervous system: Secondary | ICD-10-CM | POA: Diagnosis not present

## 2022-07-24 DIAGNOSIS — R9431 Abnormal electrocardiogram [ECG] [EKG]: Secondary | ICD-10-CM | POA: Diagnosis not present

## 2022-07-24 DIAGNOSIS — G4489 Other headache syndrome: Secondary | ICD-10-CM | POA: Diagnosis not present

## 2022-07-24 DIAGNOSIS — G8929 Other chronic pain: Secondary | ICD-10-CM | POA: Diagnosis not present

## 2022-07-24 DIAGNOSIS — I6782 Cerebral ischemia: Secondary | ICD-10-CM | POA: Diagnosis not present

## 2022-07-24 DIAGNOSIS — R404 Transient alteration of awareness: Secondary | ICD-10-CM | POA: Diagnosis not present

## 2022-07-24 DIAGNOSIS — M25562 Pain in left knee: Secondary | ICD-10-CM | POA: Diagnosis not present

## 2022-07-24 DIAGNOSIS — H579 Unspecified disorder of eye and adnexa: Secondary | ICD-10-CM | POA: Diagnosis not present

## 2022-07-24 DIAGNOSIS — I10 Essential (primary) hypertension: Secondary | ICD-10-CM | POA: Diagnosis not present

## 2022-07-24 DIAGNOSIS — E119 Type 2 diabetes mellitus without complications: Secondary | ICD-10-CM | POA: Diagnosis not present

## 2022-07-24 DIAGNOSIS — R42 Dizziness and giddiness: Secondary | ICD-10-CM | POA: Diagnosis not present

## 2022-07-24 DIAGNOSIS — G9389 Other specified disorders of brain: Secondary | ICD-10-CM | POA: Insufficient documentation

## 2022-07-24 LAB — CBC
HCT: 44.2 % (ref 36.0–46.0)
Hemoglobin: 13.9 g/dL (ref 12.0–15.0)
MCH: 29 pg (ref 26.0–34.0)
MCHC: 31.4 g/dL (ref 30.0–36.0)
MCV: 92.3 fL (ref 80.0–100.0)
Platelets: 182 10*3/uL (ref 150–400)
RBC: 4.79 MIL/uL (ref 3.87–5.11)
RDW: 19.8 % — ABNORMAL HIGH (ref 11.5–15.5)
WBC: 8.8 10*3/uL (ref 4.0–10.5)
nRBC: 0 % (ref 0.0–0.2)

## 2022-07-24 LAB — BASIC METABOLIC PANEL
Anion gap: 10 (ref 5–15)
BUN: 18 mg/dL (ref 8–23)
CO2: 24 mmol/L (ref 22–32)
Calcium: 8.6 mg/dL — ABNORMAL LOW (ref 8.9–10.3)
Chloride: 104 mmol/L (ref 98–111)
Creatinine, Ser: 1.03 mg/dL — ABNORMAL HIGH (ref 0.44–1.00)
GFR, Estimated: 55 mL/min — ABNORMAL LOW (ref >60)
Glucose, Bld: 125 mg/dL — ABNORMAL HIGH (ref 70–99)
Potassium: 3.6 mmol/L (ref 3.5–5.1)
Sodium: 138 mmol/L (ref 135–145)

## 2022-07-24 LAB — PROTIME-INR
INR: 3.1 — ABNORMAL HIGH (ref 0.8–1.2)
Prothrombin Time: 32.4 seconds — ABNORMAL HIGH (ref 11.4–15.2)

## 2022-07-24 LAB — APTT: aPTT: 42 seconds — ABNORMAL HIGH (ref 24–36)

## 2022-07-24 MED ORDER — HYDROMORPHONE HCL 1 MG/ML IJ SOLN
1.0000 mg | Freq: Once | INTRAMUSCULAR | Status: AC
  Administered 2022-07-24: 1 mg via INTRAVENOUS
  Filled 2022-07-24: qty 1

## 2022-07-24 NOTE — ED Notes (Signed)
Patient transported to MRI 

## 2022-07-24 NOTE — ED Provider Notes (Signed)
[place] EMERGENCY DEPARTMENT AT [facility] Provider Note   CSN: [id number] Arrival date & time: 07/24/22  4098     History  Chief Complaint  Patient presents with   Knee Pain    Hannah [name] is a 82 y.o. female with PMH significant for DM, HLD, pulmonary HTN, bilateral hip and knee replacements previously who presents with concern for inability to move her LLE this morning on waking.  Patient has been seen multiple times recently in ED for falls secondary to hip and knee pain.  She has some chronic left knee/hip pain.  On arrival she reports that she does have some sensation and movement of the affected extremity although it feels different on the left than the right.  She reports 10/10 pain on the left.   Knee Pain      Home Medications Prior to Admission medications   Medication Sig Start Date End Date Taking? Authorizing Provider  acetaminophen (TYLENOL) 500 MG tablet Take 1,000 mg by mouth every 6 (six) hours as needed for mild pain.    [provider]  atorvastatin (LIPITOR) 40 MG tablet Take 1 tablet (40 mg total) by mouth daily. 03/03/22   [name], MD  dapagliflozin propanediol (FARXIGA) 10 MG TABS tablet Take 1 tablet (10 mg total) by mouth daily before breakfast. 12/23/21   Pricilla Riffle, MD  diclofenac Sodium (VOLTAREN ARTHRITIS PAIN) 1 % GEL Apply 2 g topically as needed.    [provider]  furosemide (LASIX) 20 MG tablet Take 1 tablet (20 mg total) by mouth daily as needed for fluid or edema. 03/10/21   Wynn Banker, MD  glucose blood (ONETOUCH VERIO) test strip USE 1 STRIP TO CHECK GLUCOSE ONCE DAILY AS DIRECTED 05/13/22   [name], MD  metoprolol succinate (TOPROL XL) 25 MG 24 hr tablet Take 1 tablet (25 mg total) by mouth daily. 06/08/22 06/08/23  Clegg, Amy D, NP  nitroGLYCERIN (NITROSTAT) 0.4 MG SL tablet Place 1 tablet (0.4 mg total) under the tongue every 5 (five) minutes as needed for chest pain. 04/30/22    Willeen Niece, MD  OneTouch Delica Lancets 30G MISC USE 1  TO CHECK GLUCOSE ONCE DAILY 05/13/22   [name], MD  pantoprazole (PROTONIX) 40 MG tablet Take 1 tablet (40 mg total) by mouth daily. 02/26/21   Koberlein, Paris Lore, MD  sacubitril-valsartan (ENTRESTO) 24-26 MG Take 1 tablet by mouth 2 (two) times daily. 12/14/21   [name], MD  spironolactone (ALDACTONE) 25 MG tablet Take 0.5 tablets (12.5 mg total) by mouth daily. 06/27/22   Andrey Farmer, PA-C  vitamin B-12 (CYANOCOBALAMIN) 1000 MCG tablet Take 1 tablet (1,000 mcg total) by mouth daily. 05/15/20   Wynn Banker, MD  vitamin C (ASCORBIC ACID) 500 MG tablet Take 500 mg by mouth daily.     [provider]  warfarin (COUMADIN) 2.5 MG tablet Take 1 tablet (2.5 mg total) by mouth daily. 06/29/22   Pricilla Riffle, MD      Allergies    Augmentin [amoxicillin-pot clavulanate], Tranxene [clorazepate], Crestor [rosuvastatin], and Penicillins    Review of Systems   Review of Systems  All other systems reviewed and are negative.   Physical Exam Updated Vital Signs BP 131/82 (BP Location: Right Arm)   Pulse 71   Temp 97.6 F (36.4 C) (Oral)   Resp 13   SpO2 100%  Physical Exam Vitals and nursing note reviewed.  Constitutional:  General: She is not in acute distress.    Appearance: Normal appearance.  HENT:     Head: Normocephalic and atraumatic.  Eyes:     General:        Right eye: No discharge.        Left eye: No discharge.  Cardiovascular:     Rate and Rhythm: Normal rate and regular rhythm.  Pulmonary:     Effort: Pulmonary effort is normal. No respiratory distress.  Musculoskeletal:        General: No deformity.  Skin:    General: Skin is warm and dry.  Neurological:     Mental Status: She is alert and oriented to person, place, and time.     Comments: Cranial nerves II through XII grossly intact.  Intact finger-nose, intact heel-to-shin.  Romberg and gait deferred secondary  to pain alert and oriented x3.  Moves all 4 limbs spontaneously, normal coordination.  No pronator drift.  Intact strength 5 out of 5 bilateral upper extremities, decree strength on the left, 4/5 lection at the left hip, and left knee.    Psychiatric:        Mood and Affect: Mood normal.        Behavior: Behavior normal.     ED Results / Procedures / Treatments   Labs (all labs ordered are listed, but only abnormal results are displayed) Labs Reviewed  CBC - Abnormal; Notable for the following components:      Result Value   RDW 19.8 (*)    All other components within normal limits  BASIC METABOLIC PANEL - Abnormal; Notable for the following components:   Glucose, Bld 125 (*)    Creatinine, Ser 1.03 (*)    Calcium 8.6 (*)    GFR, Estimated 55 (*)    All other components within normal limits  APTT - Abnormal; Notable for the following components:   aPTT 42 (*)    All other components within normal limits  PROTIME-INR - Abnormal; Notable for the following components:   Prothrombin Time 32.4 (*)    INR 3.1 (*)    All other components within normal limits    EKG None  Radiology MR BRAIN WO CONTRAST  Result Date: 07/24/2022 CLINICAL DATA:  Neuro deficit with stroke suspected EXAM: MRI HEAD WITHOUT CONTRAST TECHNIQUE: Multiplanar, multiecho pulse sequences of the brain and surrounding structures were obtained without intravenous contrast. COMPARISON:  04/25/2022 FINDINGS: Brain: No acute infarction, hemorrhage, obstructive hydrocephalus, extra-axial collection or mass lesion. Marked brain atrophy with perisylvian and inter hemispheric widening. There is ventriculomegaly likely related to the degree of atrophy rather than communicating hydrocephalus, although there is some up lifting of the corpus callosum and sulcal narrowing at the vertex. Chronic small vessel ischemia in the cerebral white matter and especially in the pons. Vascular: Major flow voids are preserved Skull and upper  cervical spine: Normal marrow signal. Sinuses/Orbits: Negative. IMPRESSION: 1. No acute finding. 2. Marked brain atrophy with ventriculomegaly. Electronically Signed   By: Tiburcio Pea M.D.   On: 07/24/2022 12:30    Procedures Procedures    Medications Ordered in ED Medications  HYDROmorphone (DILAUDID) injection 1 mg (has no administration in time range)    ED Course/ Medical Decision Making/ A&P                             Medical Decision Making Amount and/or Complexity of Data Reviewed Labs: ordered. Radiology: ordered.  Risk  Prescription drug management.   This patient is a 82 y.o. female who presents to the ED for concern of weakness, this involves an extensive number of treatment options, and is a complaint that carries with it a high risk of complications and morbidity. The emergent differential diagnosis prior to evaluation includes, but is not limited to,  CVA, spinal cord injury, ACS, arrhythmia, syncope, orthostatic hypotension, sepsis, hypoglycemia, hypoxia, electrolyte disturbance, endocrine disorder, anemia, environmental exposure, polypharmacy  Given her significant previous knee and hip replacements I do have some suspicion that she is having functional weakness secondary to musculoskeletal chronic injuries. This is not an exhaustive differential.   Past Medical History / Co-morbidities / Social History: DM, HLD, pulmonary HTN, bilateral hip and knee replacements   Additional history: Chart reviewed. Pertinent results include: Extensively reviewed recent previous emergency department visits for similar including Recent CTs, MRIs from April, CT of the head and C-spine from 2 days ago after fall  Physical Exam: Physical exam performed. The pertinent findings include: Patient does have significant tenderness of the left hip, pain with movement of the left knee, and decree strength secondary to pain, she has intact sensation throughout although she endorses some  difference in sensation on the left compared to the right  Lab Tests: I ordered, and personally interpreted labs.  The pertinent results include: CBC unremarkable, BMP overall unremarkable, INR is elevated at 3.1 which is very near to therapeutic range for warfarin, no additional adjustment needed at this time   Imaging Studies: I ordered imaging studies including MRI brain. I independently visualized and interpreted imaging which showed no evidence of new stroke, or other abnormality to explain patient's weakness. I agree with the radiologist interpretation.   Cardiac Monitoring:  The patient was maintained on a cardiac monitor.  My attending physician Dr. Jearld Fenton viewed and interpreted the cardiac monitored which showed an underlying rhythm of: NSR, LBBB. I agree with this interpretation.   Medications: I ordered medication including dilaudid  for pain. Reevaluation of the patient after these medicines showed that the patient improved. I have reviewed the patients home medicines and have made adjustments as needed.   Disposition: After consideration of the diagnostic results and the patients response to treatment, I feel that patient ultimately endorses ongoing worsening pain, weakness of the lower extremities after hip replacement, knee replacement, recent falls, however she does not have a new functional reason for weakness requiring hospital admission.  I think that outpatient rehab, continue PT, OT, and pain control are reasonable neck steps, we did discussion about ED boarding for PT OT eval, however as patient has walker, as well as a wheelchair at home at this time I think that PTAR transport home with close follow up is appropriate at this time.   emergency department workup does not suggest an emergent condition requiring admission or immediate intervention beyond what has been performed at this time. The plan is: as above. The patient is safe for discharge and has been instructed to return  immediately for worsening symptoms, change in symptoms or any other concerns.  I discussed this case with my attending physician Dr. Jearld Fenton who cosigned this note including patient's presenting symptoms, physical exam, and planned diagnostics and interventions. Attending physician stated agreement with plan or made changes to plan which were implemented.    Final Clinical Impression(s) / ED Diagnoses Final diagnoses:  Acute pain of left knee  Pain of left hip    Rx / DC Orders ED Discharge Orders  None         Olene Floss, PA-C 07/24/22 1438    Loetta Rough, MD 07/31/22 [phone]

## 2022-07-24 NOTE — ED Triage Notes (Signed)
Pt BIBEMS from home w cc worsening chronic left knee/hip pain. Pt states that she has had multiple hip and knee surgeries and has been seen multiple times recently in the ED for falls r/t hip and knee pain. Pt states when she woke up this morning her knee pain felt worse. Pt is on coumadin for hx of previous CVA but states she had no fall or any injuries related to falling. Pt is a&ox4,vss,nad.   BP 124/72 HR 74 RR 18 96% RA BG 205

## 2022-07-24 NOTE — ED Notes (Signed)
Pt returned from MRI °

## 2022-07-24 NOTE — Discharge Instructions (Signed)
Please use Tylenol for pain.  You may use 1000 mg of Tylenol every 6 hours.  Not to exceed 4 g of Tylenol within 24 hours.  Please continue to follow-up with your primary care doctor, physical therapy, Occupational Therapy, I think that your plan to contact your physician about possible inpatient rehab to help with your walking is a reasonable plan

## 2022-07-25 ENCOUNTER — Ambulatory Visit (INDEPENDENT_AMBULATORY_CARE_PROVIDER_SITE_OTHER): Payer: Medicare Other | Admitting: Family Medicine

## 2022-07-25 ENCOUNTER — Encounter: Payer: Self-pay | Admitting: Licensed Clinical Social Worker

## 2022-07-25 ENCOUNTER — Encounter: Payer: Self-pay | Admitting: Family Medicine

## 2022-07-25 VITALS — BP 138/88 | HR 105 | Temp 97.9°F | Ht 67.0 in

## 2022-07-25 DIAGNOSIS — R296 Repeated falls: Secondary | ICD-10-CM

## 2022-07-25 DIAGNOSIS — R531 Weakness: Secondary | ICD-10-CM | POA: Diagnosis not present

## 2022-07-25 DIAGNOSIS — F320 Major depressive disorder, single episode, mild: Secondary | ICD-10-CM | POA: Diagnosis not present

## 2022-07-25 MED ORDER — DULOXETINE HCL 30 MG PO CPEP: 30.0000 mg | ORAL_CAPSULE | Freq: Every day | ORAL | 0 refills | Status: DC

## 2022-07-25 MED ORDER — TRANSPORT CHAIR MISC
1.0000 | Freq: Once | 0 refills | Status: AC
Start: 2022-07-25 — End: 2022-07-25

## 2022-07-25 NOTE — Assessment & Plan Note (Signed)
We discussed that starting cymbalta once daily might help with her symptoms of poor motivation and feelings of hopelessness. It may also improve her pain level as well, allowing her to increase physical activity. I have sent in the medication for her to try. She will see me back after she completes physical therapy to follow up on her mood symptoms.

## 2022-07-25 NOTE — Progress Notes (Signed)
Established Patient Office Visit  Subjective   Patient ID: Hannah [name], female    DOB: [birth year]  Age: 82 y.o. MRN: [id number]  Chief Complaint  Patient presents with   Follow-up    Patient is here for ER follow up. She has had multiple falls in the past, was seen in the ER twice for this in the past month. Work up was performed and it was recommended that she get physical therapy for strengthening and balance. Daughter is in the visit with her today, states that it takes 2 people to lift her out of bed and sit her in the chair. Cannot walk without assistance, its getting worse over time. States that home health was coming to her home for physical therapy, however she was discharged from home health due to "not putting in a full effort".   Daughter reports that she has mentioned several times that she is just "waiting for God to take her" that it would be better off if she "weren't here", etc. Family is concerned that she is developing depression because of her weakness and chronic pain.     Current Outpatient Medications  Medication Instructions   acetaminophen (TYLENOL) 1,000 mg, Oral, Every 6 hours PRN   ascorbic acid (VITAMIN C) 500 mg, Oral, Daily   atorvastatin (LIPITOR) 40 mg, Oral, Daily   cyanocobalamin (VITAMIN B12) 1,000 mcg, Oral, Daily   dapagliflozin propanediol (FARXIGA) 10 mg, Oral, Daily before breakfast   diclofenac Sodium (VOLTAREN ARTHRITIS PAIN) 2 g, Topical, As needed   DULoxetine (CYMBALTA) 30 mg, Oral, Daily   furosemide (LASIX) 20 mg, Oral, Daily PRN   glucose blood (ONETOUCH VERIO) test strip USE 1 STRIP TO CHECK GLUCOSE ONCE DAILY AS DIRECTED   metoprolol succinate (TOPROL XL) 25 mg, Oral, Daily   Misc. Devices (TRANSPORT CHAIR) MISC 1 each, Does not apply,  Once   nitroGLYCERIN (NITROSTAT) 0.4 mg, Sublingual, Every 5 min PRN   OneTouch Delica Lancets 30G MISC USE 1  TO CHECK GLUCOSE ONCE DAILY   pantoprazole (PROTONIX) 40 mg, Oral, Daily    sacubitril-valsartan (ENTRESTO) 24-26 MG 1 tablet, Oral, 2 times daily   spironolactone (ALDACTONE) 12.5 mg, Oral, Daily   warfarin (COUMADIN) 2.5 mg, Oral, Daily    Patient Active Problem List   Diagnosis Date Noted   Depression, major, single episode, mild (HCC) 07/25/2022   Weakness of right lower extremity 04/25/2022   HTN (hypertension) 04/25/2022   Chronic systolic CHF (congestive heart failure) (HCC) 04/25/2022   Lower extremity weakness 04/25/2022   Right sided weakness 04/25/2022   S/P colostomy (HCC) 04/25/2022   NSTEMI (non-ST elevated myocardial infarction) (HCC) 04/25/2022   Elevated troponin    Dilated cardiomyopathy (HCC)    Ischemic cardiomyopathy    PAF (paroxysmal atrial fibrillation) (HCC)    Non-ST elevation (NSTEMI) myocardial infarction (HCC) 12/25/2020   Thrombocytopenia (HCC) 04/23/2020   CAD (coronary artery disease) 06/24/2019   Hypertension 04/25/2019   Atypical chest pain 12/15/2018   Chronic kidney disease, stage 3a (HCC) 12/15/2018   Frequent falls 05/21/2018   HFimpEF (heart failure with improved ejection fraction) (HCC) 03/15/2017   Anticoagulated on warfarin    Pulmonary hypertension (HCC) 07/26/2016   Neuropathy 02/12/2015   History of pulmonary embolism 06/10/2013   Hypothyroid 06/06/2013   Diabetes mellitus with neurological manifestations, controlled (HCC) 06/06/2013   Hyperlipidemia 02/25/2013   DM2 (diabetes mellitus, type 2) (HCC) 07/23/2012      Review of Systems  All other systems reviewed and are negative.  Objective:     BP 138/88 (BP Location: Left Arm, Patient Position: Sitting, Cuff Size: Large)   Pulse (!) 105   Temp 97.9 F (36.6 C) (Oral)   Ht 5\' 7"  (1.702 m)   SpO2 96%   BMI 29.00 kg/m    Physical Exam Vitals reviewed.  Constitutional:      Appearance: Normal appearance. She is well-groomed and normal weight.  Cardiovascular:     Rate and Rhythm: Normal rate and regular rhythm.     Heart sounds: S1  normal and S2 normal.  Pulmonary:     Effort: Pulmonary effort is normal.     Breath sounds: Normal breath sounds and air entry.  Musculoskeletal:     Right lower leg: No edema.     Left lower leg: No edema.  Neurological:     Mental Status: She is alert and oriented to person, place, and time. Mental status is at baseline.     Gait: Gait is intact.  Psychiatric:        Mood and Affect: Mood and affect normal.        Speech: Speech normal.        Behavior: Behavior normal.        Judgment: Judgment normal.      No results found for any visits on 07/25/22.    The ASCVD Risk score (Arnett DK, et al., 2019) failed to calculate for the following reasons:   The 2019 ASCVD risk score is only valid for ages 59 to 57   The patient has a prior MI or stroke diagnosis    Assessment & Plan:  Frequent falls -     Ambulatory referral to Social Work -     For home use only DME 4 wheeled rolling walker with seat Chemical engineer; 1 each by Does not apply route once for 1 dose.  Dispense: 1 each; Refill: 0  Generalized weakness -     Ambulatory referral to Social Work -     For home use only DME 4 wheeled rolling walker with seat  Depression, major, single episode, mild (HCC) Assessment & Plan: We discussed that starting cymbalta once daily might help with her symptoms of poor motivation and feelings of hopelessness. It may also improve her pain level as well, allowing her to increase physical activity. I have sent in the medication for her to try. She will see me back after she completes physical therapy to follow up on her mood symptoms.   Orders: -     DULoxetine HCl; Take 1 capsule (30 mg total) by mouth daily.  Dispense: 90 capsule; Refill: 0   Recurrent falls, her weakness is worsening progressively to the point that she needs 1-2 person assist for transfers and ambulation. Placed a referral for social work to help with rehab placement. She will RTC when she is discharged from  rehab for follow up.   Return in about 3 months (around 10/25/2022).   I spent 30 minutes with patient and family today discussing her symptoms, placing ordered for DME and SNF placement, [name], MD

## 2022-07-26 ENCOUNTER — Encounter: Payer: Self-pay | Admitting: Family Medicine

## 2022-07-26 ENCOUNTER — Telehealth: Payer: Self-pay

## 2022-07-26 NOTE — Telephone Encounter (Signed)
Transition Care Management Follow-up Telephone Call Date of discharge and from where: 07/24/2022 [facility] How have you been since you were released from the hospital? Patient is feeling better. Any questions or concerns? No  Items Reviewed: Did the pt receive and understand the discharge instructions provided? Yes  Medications obtained and verified?  No medication prescribed Other? No  Any new allergies since your discharge? No  Dietary orders reviewed? Yes Do you have support at home? Yes   Follow up appointments reviewed:  PCP Hospital f/u appt confirmed? Yes  Scheduled to see Nira Conn, MD on 07/25/2022 @ [facility] [place]. Specialist Hospital f/u appt confirmed? No  Scheduled to see  on  @ . Are transportation arrangements needed?  Mailing SCAT application to patient's daughter home per her request. If their condition worsens, is the pt aware to call PCP or go to the Emergency Dept.? Yes Was the patient provided with contact information for the PCP's office or ED? Yes Was to pt encouraged to call back with questions or concerns? Yes  Elyza Whitt [facility]  [facility] Population Health Community Resource Care Guide   [email or web address]  ?? [phone]   Website: [email or web address]  [email or web address]

## 2022-07-27 ENCOUNTER — Encounter: Payer: Self-pay | Admitting: Family Medicine

## 2022-07-27 DIAGNOSIS — R296 Repeated falls: Secondary | ICD-10-CM

## 2022-07-27 DIAGNOSIS — R531 Weakness: Secondary | ICD-10-CM

## 2022-07-27 NOTE — Patient Outreach (Signed)
  Care Coordination   Collaboration  Visit Note   07/25/2022 Name: Hannah [name] MRN: [id number] DOB: [birth year]  Hannah [name] is a 82 y.o. year old female who sees [name], MD for primary care. I  engaged with PCP  What matters to the patients health and wellness today?  Care Coordination    SDOH assessments and interventions completed:  No     Care Coordination Interventions:  Yes, provided  Interventions Today    Flowsheet Row Most Recent Value  Chronic Disease   Chronic disease during today's visit Hypertension (HTN), Congestive Heart Failure (CHF), Diabetes, Chronic Kidney Disease/End Stage Renal Disease (ESRD), Other  [Depression]  General Interventions   General Interventions Discussed/Reviewed Communication with  Communication with PCP/Specialists  [PCP engaged Hannah [name] about addressing disease management and/or social support needs]       Follow up plan: Referral made to Care Coordination. Care guide will obtain consent from pt and schedule initial with Hannah [name]    Encounter Outcome:  Pt. Visit Completed   Hannah [name], Hannah [name], Hannah [name] [facility] Care Management [facility]  [facility] [email or web address] Phone [phone] 5:15 PM

## 2022-07-28 ENCOUNTER — Encounter: Payer: Self-pay | Admitting: Licensed Clinical Social Worker

## 2022-07-28 DIAGNOSIS — M6281 Muscle weakness (generalized): Secondary | ICD-10-CM | POA: Diagnosis not present

## 2022-07-28 NOTE — Telephone Encounter (Signed)
Call Hannah [name] several time but got no answer pt stated that she haven't talk to the  social worker yet.

## 2022-07-28 NOTE — Telephone Encounter (Signed)
There is no specific order in DME for a transport chair in epic so it got put in as a prescription-- how can I fix this?

## 2022-07-28 NOTE — Telephone Encounter (Signed)
Please send a copy of the DME order to the fax number listed in the message. OK to increase the hours with home health

## 2022-07-28 NOTE — Telephone Encounter (Signed)
Can we ask the social worker to reach out? Our time is limited because she is almost out of the 90 day window from her admission. Also ok to send an order increasing her hours wih the home health company

## 2022-07-28 NOTE — Telephone Encounter (Signed)
Message sent to PCP to enter the order for a transport chair as this cannot be found under DME orders.

## 2022-07-28 NOTE — Telephone Encounter (Signed)
Order signed, thanks.

## 2022-07-29 ENCOUNTER — Telehealth: Payer: Self-pay | Admitting: Licensed Clinical Social Worker

## 2022-07-29 ENCOUNTER — Ambulatory Visit: Payer: Medicare Other | Attending: Internal Medicine

## 2022-07-29 DIAGNOSIS — I2699 Other pulmonary embolism without acute cor pulmonale: Secondary | ICD-10-CM | POA: Diagnosis not present

## 2022-07-29 DIAGNOSIS — Z5181 Encounter for therapeutic drug level monitoring: Secondary | ICD-10-CM

## 2022-07-29 LAB — POCT INR: INR: 2.7 (ref 2.0–3.0)

## 2022-07-29 NOTE — Patient Instructions (Signed)
Continue taking Warfarin 2.5mg  (1 tablet) daily except 1.25mg  (1/2 tablet) on Sundays.  Keep a green leafy veggie in your diet 2 times a week.  Recheck INR in 3 weeks.  [facility] [phone].

## 2022-07-29 NOTE — Patient Outreach (Signed)
  Care Coordination   Collaboration  Visit Note   07/28/2022 Name: Hannah [name] MRN: [id number] DOB: 21-May-1940  Hannah [name] is a 82 y.o. year old female who sees [name], MD for primary care.   What matters to the patients health and wellness today?  Patient was not engaged during this encounter    SDOH assessments and interventions completed:  No     Care Coordination Interventions:  Yes, provided   Follow up plan:  LCSW will make additional attempts to speak with Patient Care Advocate     Encounter Outcome:  Pt. Visit Completed  [name], MSW, LCSW [facility] Care Management [facility]  [facility] [email or web address] Phone [phone] 7:06 AM

## 2022-07-29 NOTE — Telephone Encounter (Signed)
I'm not sure what else I can do-- we just need to wait for the social worker to get it approved through insurance.

## 2022-07-29 NOTE — Telephone Encounter (Signed)
Noted  

## 2022-08-01 ENCOUNTER — Ambulatory Visit: Payer: Self-pay | Admitting: Licensed Clinical Social Worker

## 2022-08-01 NOTE — Patient Instructions (Signed)
Visit Information  Thank you for taking time to visit with me today. Please don't hesitate to contact me if I can be of assistance to you.   Following are the goals we discussed today:   Goals Addressed             This Visit's Progress    Obtain Supportive Resources-Placement       Activities and task to complete in order to accomplish goals.   Keep all upcoming appointments discussed today Continue with compliance of taking medication prescribed by Doctor Implement healthy coping skills discussed to assist with management of symptoms F/up with SNF regarding availability         Our next appointment is by telephone on 07/29 at 3 PM  Please call the care guide team at [phone] if you need to cancel or reschedule your appointment.   If you are experiencing a Mental Health or Behavioral Health Crisis or need someone to talk to, please call the Suicide and Crisis Lifeline: 988 call 911   Patient verbalizes understanding of instructions and care plan provided today and agrees to view in MyChart. Active MyChart status and patient understanding of how to access instructions and care plan via MyChart confirmed with patient.     [name], MSW, LCSW [facility] Care Management [place]  [facility] [email or web address] Phone [phone] 1:45 PM

## 2022-08-01 NOTE — Patient Outreach (Signed)
  Care Coordination   Initial Visit Note   07/29/2022 Name: Hannah [name] MRN: [id number] DOB: 1940-02-03  Hannah [name] is a 82 y.o. year old female who sees [name], MD for primary care. I spoke with  Hannah [name] by phone today.  What matters to the patients health and wellness today?  Placement    Goals Addressed             This Visit's Progress    Obtain Supportive Resources-Placement       Activities and task to complete in order to accomplish goals.   Keep all upcoming appointments discussed today Continue with compliance of taking medication prescribed by Doctor Implement healthy coping skills discussed to assist with management of symptoms F/up with SNF regarding availability         SDOH assessments and interventions completed:  No     Care Coordination Interventions:  Yes, provided  Interventions Today    Flowsheet Row Most Recent Value  Chronic Disease   Chronic disease during today's visit Hypertension (HTN), Congestive Heart Failure (CHF), Diabetes, Chronic Kidney Disease/End Stage Renal Disease (ESRD), Other  [Depression, CAD, PAF]  General Interventions   General Interventions Discussed/Reviewed General Interventions Discussed, Doctor Visits, Level of Care  Doctor Visits Discussed/Reviewed Doctor Visits Reviewed  Level of Care Adult Daycare, Applications, Skilled Nursing Facility  Applications FL-2, Personal Care Services  Education Interventions   Applications FL-2, Personal Care Services  Mental Health Interventions   Mental Health Discussed/Reviewed Mental Health Discussed, Coping Strategies  [Caregiver Support]  Nutrition Interventions   Nutrition Discussed/Reviewed Nutrition Discussed  Pharmacy Interventions   Pharmacy Dicussed/Reviewed Pharmacy Topics Discussed  Safety Interventions   Safety Discussed/Reviewed Safety Discussed, Fall Risk       Follow up plan: Follow up call scheduled for 08/01/22    Encounter Outcome:  Pt.  Visit Completed   [name], MSW, LCSW [facility] Care Management [facility]  [facility] [email or web address] Phone [phone] 1:45 PM

## 2022-08-02 NOTE — Patient Outreach (Signed)
  Care Coordination   Follow Up Visit Note   08/01/2022 Name: Hannah [name] MRN: [id number] DOB: 04-15-1940  Hannah [name] is a 82 y.o. year old female who sees [name], MD for primary care. I spoke with  Hannah [name]'s daughter by phone today.  What matters to the patients health and wellness today?  Placement and Levels of Care    Goals Addressed             This Visit's Progress    Obtain Supportive Resources-Placement   On track    Activities and task to complete in order to accomplish goals.   Keep all upcoming appointments discussed today Continue with compliance of taking medication prescribed by Doctor Implement healthy coping skills discussed to assist with management of symptoms F/up with SNF regarding availability         SDOH assessments and interventions completed:  No     Care Coordination Interventions:  Yes, provided  Interventions Today    Flowsheet Row Most Recent Value  Chronic Disease   Chronic disease during today's visit Hypertension (HTN), Diabetes, Other  [Depression]  General Interventions   General Interventions Discussed/Reviewed General Interventions Reviewed, Level of Care  [Family reached out to [facility]'s Admission Director and is awaiting a response. Waiting on DSS SW/Supervisor to return call regarding pt's aid services. Family would like to continue pursuing placement for rehab]  Level of Care Skilled Nursing Facility, Personal Care Services       Follow up plan: Follow up call scheduled for within a week    Encounter Outcome:  Pt. Visit Completed   [name], MSW, LCSW [facility] Care Management [facility]  [facility] [email or web address] Phone [phone] 4:52 PM

## 2022-08-02 NOTE — Patient Instructions (Signed)
Visit Information  Thank you for taking time to visit with me today. Please don't hesitate to contact me if I can be of assistance to you.   Following are the goals we discussed today:   Goals Addressed             This Visit's Progress    Obtain Supportive Resources-Placement   On track    Activities and task to complete in order to accomplish goals.   Keep all upcoming appointments discussed today Continue with compliance of taking medication prescribed by Doctor Implement healthy coping skills discussed to assist with management of symptoms F/up with SNF regarding availability        Please call the care guide team at [phone] if you need to cancel or reschedule your appointment.   If you are experiencing a Mental Health or Behavioral Health Crisis or need someone to talk to, please call the Suicide and Crisis Lifeline: 988 call 911   Patient verbalizes understanding of instructions and care plan provided today and agrees to view in MyChart. Active MyChart status and patient understanding of how to access instructions and care plan via MyChart confirmed with patient.     [name], MSW, LCSW [facility] Care Management   [facility] [email or web address] Phone [phone] 4:52 PM

## 2022-08-03 ENCOUNTER — Telehealth: Payer: Self-pay | Admitting: Licensed Clinical Social Worker

## 2022-08-03 NOTE — Patient Outreach (Signed)
  Care Coordination   Follow Up Visit Note   08/02/2022 Name: Hannah [name] MRN: [id number] DOB: [birth year]  Hannah [name] is a 82 y.o. year old female who sees [name], MD for primary care. I spoke with  Louri Pizano's daughter by phone today.  What matters to the patients health and wellness today?  Placement    Goals Addressed             This Visit's Progress    Obtain Supportive Resources-Placement   On track    Activities and task to complete in order to accomplish goals.   Keep all upcoming appointments discussed today Continue with compliance of taking medication prescribed by Doctor Implement healthy coping skills discussed to assist with management of symptoms F/up with SNF regarding availability Review listing of SNFs         SDOH assessments and interventions completed:  No     Care Coordination Interventions:  Yes, provided  Interventions Today    Flowsheet Row Most Recent Value  Chronic Disease   Chronic disease during today's visit Hypertension (HTN), Atrial Fibrillation (AFib), Congestive Heart Failure (CHF), Diabetes, Chronic Kidney Disease/End Stage Renal Disease (ESRD), Other  [Depression]  General Interventions   General Interventions Discussed/Reviewed General Interventions Reviewed, Civil engineer, contracting of local SNF. Family has not heard back from [facility] and would like to inquire with other facilities for placement. Pt isn't eligible for any additional aid hrs through DSS grant progam. Can obtain addl hrs through Medicare]  Level of Care Skilled Nursing Facility       Follow up plan: Follow up call scheduled for 1-2 weeks    Encounter Outcome:  Pt. Visit Completed   [name], MSW, LCSW [facility] Care Management [facility] Health  [facility] [email or web address] Phone [phone] 10:18 AM

## 2022-08-03 NOTE — Patient Instructions (Signed)
Visit Information  Thank you for taking time to visit with me today. Please don't hesitate to contact me if I can be of assistance to you.   Following are the goals we discussed today:   Goals Addressed             This Visit's Progress    Obtain Supportive Resources-Placement   On track    Activities and task to complete in order to accomplish goals.   Keep all upcoming appointments discussed today Continue with compliance of taking medication prescribed by Doctor Implement healthy coping skills discussed to assist with management of symptoms F/up with SNF regarding availability Review listing of SNFs         Please call the care guide team at [phone] if you need to cancel or reschedule your appointment.   If you are experiencing a Mental Health or Behavioral Health Crisis or need someone to talk to, please call the Suicide and Crisis Lifeline: 988 call 911   Patient verbalizes understanding of instructions and care plan provided today and agrees to view in MyChart. Active MyChart status and patient understanding of how to access instructions and care plan via MyChart confirmed with patient.     [name], MSW, LCSW [facility] Care Management [place]  [facility] [email or web address] Phone [phone] 10:18 AM

## 2022-08-04 ENCOUNTER — Telehealth: Payer: Self-pay | Admitting: Family Medicine

## 2022-08-04 ENCOUNTER — Telehealth: Payer: Self-pay | Admitting: Internal Medicine

## 2022-08-04 NOTE — Telephone Encounter (Signed)
Pt daughter called in stating pt is trying to get into a  residential rehab facility for intense physical therapy to regain strength. She asked if she could speak with the social worker at the office. Please advise.

## 2022-08-04 NOTE — Telephone Encounter (Signed)
Requesting to speak with someone regarding the process of getting her mother in to a residential rehab, home health care and physical therapy

## 2022-08-05 ENCOUNTER — Telehealth: Payer: Self-pay | Admitting: Licensed Clinical Social Worker

## 2022-08-05 NOTE — Patient Instructions (Signed)
Visit Information  Thank you for taking time to visit with me today. Please don't hesitate to contact me if I can be of assistance to you.   Following are the goals we discussed today:   Goals Addressed             This Visit's Progress    Obtain Supportive Resources-Placement   On track    Activities and task to complete in order to accomplish goals.   Keep all upcoming appointments discussed today Continue with compliance of taking medication prescribed by Doctor Implement healthy coping skills discussed to assist with management of symptoms F/up with Cardiology F/up with DSS         Please call the care guide team at [phone] if you need to cancel or reschedule your appointment.   If you are experiencing a Mental Health or Behavioral Health Crisis or need someone to talk to, please call the Suicide and Crisis Lifeline: 988 call 911   Patient verbalizes understanding of instructions and care plan provided today and agrees to view in MyChart. Active MyChart status and patient understanding of how to access instructions and care plan via MyChart confirmed with patient.     [name], MSW, LCSW [facility] Care Management [place]  [facility] [email or web address] Phone [phone] 6:54 AM

## 2022-08-05 NOTE — Patient Outreach (Signed)
  Care Coordination   Follow Up Visit Note   08/04/2022 Name: Hannah [name] MRN: [id number] DOB: Sep 01, 1940  Hannah [name] is a 82 y.o. year old female who sees [name], MD for primary care. I spoke with  Azrael Tuggle's daughter [name] by phone today.  What matters to the patients health and wellness today?  Level of care    Goals Addressed             This Visit's Progress    Obtain Supportive Resources-Placement   On track    Activities and task to complete in order to accomplish goals.   Keep all upcoming appointments discussed today Continue with compliance of taking medication prescribed by Doctor Implement healthy coping skills discussed to assist with management of symptoms F/up with Cardiology F/up with DSS         SDOH assessments and interventions completed:  No     Care Coordination Interventions:  Yes, provided  Interventions Today    Flowsheet Row Most Recent Value  Chronic Disease   Chronic disease during today's visit Diabetes, Congestive Heart Failure (CHF), Atrial Fibrillation (AFib), Hypertension (HTN), Other  [Depression]  General Interventions   General Interventions Discussed/Reviewed General Interventions Reviewed, [employer], Doctor Visits, Level of Care, Communication with  [LCSW provided update to pt's daughter regarding placement. Preferred facilities have not contacted family or provided bed offers and family is concerned about lack of activity for pt. Family is requesting order for [facility] PT/OT]  Doctor Visits Discussed/Reviewed Doctor Visits Reviewed  Communication with PCP/Specialists  Level of Care Personal Care Services, Adult Daycare  Exercise Interventions   Exercise Discussed/Reviewed Physical Activity  Physical Activity Discussed/Reviewed Physical Activity Reviewed  Mental Health Interventions   Mental Health Discussed/Reviewed Mental Health Reviewed, Coping Strategies, Anxiety, Depression  Nutrition Interventions    Nutrition Discussed/Reviewed Nutrition Reviewed  Pharmacy Interventions   Pharmacy Dicussed/Reviewed Pharmacy Topics Reviewed, Medication Adherence  Safety Interventions   Safety Discussed/Reviewed Fall Risk       Follow up plan: Follow up call scheduled for 1-2 weeks    Encounter Outcome:  Pt. Visit Completed   [name], MSW, LCSW [facility] Care Management [facility] Health  [email or web address] Phone [phone] 6:53 AM

## 2022-08-08 NOTE — Addendum Note (Signed)
Addended by: Johnella Moloney on: 08/08/2022 04:42 PM   Modules accepted: Orders

## 2022-08-08 NOTE — Telephone Encounter (Signed)
Ok to send in home healthcare referral to any of these agencies

## 2022-08-10 ENCOUNTER — Ambulatory Visit (HOSPITAL_COMMUNITY): Payer: Medicare Other | Attending: Internal Medicine

## 2022-08-10 DIAGNOSIS — I3481 Nonrheumatic mitral (valve) annulus calcification: Secondary | ICD-10-CM

## 2022-08-10 DIAGNOSIS — I5020 Unspecified systolic (congestive) heart failure: Secondary | ICD-10-CM | POA: Diagnosis not present

## 2022-08-10 DIAGNOSIS — I3139 Other pericardial effusion (noninflammatory): Secondary | ICD-10-CM | POA: Diagnosis not present

## 2022-08-10 DIAGNOSIS — I10 Essential (primary) hypertension: Secondary | ICD-10-CM

## 2022-08-10 LAB — ECHOCARDIOGRAM COMPLETE
Area-P 1/2: 2.71 cm2
Calc EF: 44.7 %
S' Lateral: 2.5 cm
Single Plane A2C EF: 43.8 %
Single Plane A4C EF: 43.3 %

## 2022-08-11 ENCOUNTER — Telehealth: Payer: Self-pay | Admitting: Licensed Clinical Social Worker

## 2022-08-12 NOTE — Patient Outreach (Signed)
  Care Coordination   Follow Up Visit Note   08/11/2022 Name: Hannah [name] MRN: [id number] DOB: 03-29-1940  Hannah [name] is a 82 y.o. year old female who sees [name], MD for primary care. I spoke with  Hannah [name]'s daughter by phone today.  What matters to the patients health and wellness today?  HH services    Goals Addressed             This Visit's Progress    Obtain Supportive Resources-Placement   On track    Activities and task to complete in order to accomplish goals.   Keep all upcoming appointments discussed today Continue with compliance of taking medication prescribed by Doctor Implement healthy coping skills discussed to assist with management of symptoms F/up with Cardiology F/up with DSS         SDOH assessments and interventions completed:  No     Care Coordination Interventions:  Yes, provided  Interventions Today    Flowsheet Row Most Recent Value  Chronic Disease   Chronic disease during today's visit Hypertension (HTN), Atrial Fibrillation (AFib), Congestive Heart Failure (CHF), Diabetes, Chronic Kidney Disease/End Stage Renal Disease (ESRD), Other  [Depression]  General Interventions   General Interventions Discussed/Reviewed General Interventions Reviewed, Doctor Visits  [Family has spoken with DSS about in home assistance program, which provides 8 hrs of aid. They can obtain additional aid hrs through another program. Have not heard from [facility], provided listing to PCP office from DSS]  Doctor Visits Discussed/Reviewed Doctor Visits Reviewed, Specialist  [Scheduled initial with kidney specialist]  Exercise Interventions   Exercise Discussed/Reviewed Physical Activity  [Pt needs assistance with PT, OT, and [place] Aide]       Follow up plan: Follow up call scheduled for 1-2 weeks    Encounter Outcome:  Pt. Visit Completed   Hannah [name], MSW, LCSW [facility] Care Management [facility] Health  [email or web address] Phone [phone] 7:56 AM

## 2022-08-17 ENCOUNTER — Telehealth: Payer: Self-pay | Admitting: Family Medicine

## 2022-08-17 NOTE — Telephone Encounter (Signed)
Pt's daughter Elon Jester) called, requesting to speak to Research officer, political party. Daughter would not disclose reason.  Marrian Salvage [phone]   Daughter also requested a call back from the Referral Specialist.

## 2022-08-19 ENCOUNTER — Other Ambulatory Visit: Payer: Self-pay | Admitting: Family Medicine

## 2022-08-19 ENCOUNTER — Ambulatory Visit: Payer: Medicare Other

## 2022-08-19 DIAGNOSIS — R296 Repeated falls: Secondary | ICD-10-CM

## 2022-08-19 DIAGNOSIS — Z5181 Encounter for therapeutic drug level monitoring: Secondary | ICD-10-CM

## 2022-08-19 DIAGNOSIS — I2699 Other pulmonary embolism without acute cor pulmonale: Secondary | ICD-10-CM | POA: Diagnosis not present

## 2022-08-19 DIAGNOSIS — R29898 Other symptoms and signs involving the musculoskeletal system: Secondary | ICD-10-CM

## 2022-08-19 DIAGNOSIS — G629 Polyneuropathy, unspecified: Secondary | ICD-10-CM

## 2022-08-19 LAB — POCT INR: INR: 1.5 — AB (ref 2.0–3.0)

## 2022-08-19 NOTE — Patient Instructions (Signed)
TAKE 2 TABLETS TODAY and 2 TABLETS TOMORROW THEN Continue taking Warfarin 2.5mg  (1 tablet) daily except 1.25mg  (1/2 tablet) on Sundays.  Keep a green leafy veggie in your diet 2 times a week.  Recheck INR in 3 weeks.  [facility] [phone].

## 2022-08-24 ENCOUNTER — Emergency Department (HOSPITAL_COMMUNITY): Payer: Medicare Other

## 2022-08-24 ENCOUNTER — Other Ambulatory Visit: Payer: Self-pay

## 2022-08-24 ENCOUNTER — Inpatient Hospital Stay (HOSPITAL_COMMUNITY)
Admission: EM | Admit: 2022-08-24 | Discharge: 2022-08-28 | DRG: 963 | Disposition: A | Discharge status: Other Acute Inpatient Hospital | Payer: Medicare Other | Attending: Pulmonary Disease | Admitting: Pulmonary Disease

## 2022-08-24 DIAGNOSIS — N183 Chronic kidney disease, stage 3 unspecified: Secondary | ICD-10-CM | POA: Diagnosis not present

## 2022-08-24 DIAGNOSIS — S14129A Central cord syndrome at unspecified level of cervical spinal cord, initial encounter: Secondary | ICD-10-CM | POA: Diagnosis present

## 2022-08-24 DIAGNOSIS — E861 Hypovolemia: Secondary | ICD-10-CM | POA: Diagnosis not present

## 2022-08-24 DIAGNOSIS — I272 Pulmonary hypertension, unspecified: Secondary | ICD-10-CM | POA: Diagnosis not present

## 2022-08-24 DIAGNOSIS — W19XXXA Unspecified fall, initial encounter: Secondary | ICD-10-CM

## 2022-08-24 DIAGNOSIS — E039 Hypothyroidism, unspecified: Secondary | ICD-10-CM | POA: Diagnosis present

## 2022-08-24 DIAGNOSIS — I480 Paroxysmal atrial fibrillation: Secondary | ICD-10-CM | POA: Diagnosis present

## 2022-08-24 DIAGNOSIS — R339 Retention of urine, unspecified: Secondary | ICD-10-CM | POA: Diagnosis not present

## 2022-08-24 DIAGNOSIS — Z96642 Presence of left artificial hip joint: Secondary | ICD-10-CM | POA: Diagnosis not present

## 2022-08-24 DIAGNOSIS — Z87891 Personal history of nicotine dependence: Secondary | ICD-10-CM | POA: Diagnosis not present

## 2022-08-24 DIAGNOSIS — S12100A Unspecified displaced fracture of second cervical vertebra, initial encounter for closed fracture: Secondary | ICD-10-CM | POA: Diagnosis not present

## 2022-08-24 DIAGNOSIS — S0003XA Contusion of scalp, initial encounter: Secondary | ICD-10-CM | POA: Diagnosis not present

## 2022-08-24 DIAGNOSIS — I428 Other cardiomyopathies: Secondary | ICD-10-CM | POA: Diagnosis present

## 2022-08-24 DIAGNOSIS — I130 Hypertensive heart and chronic kidney disease with heart failure and stage 1 through stage 4 chronic kidney disease, or unspecified chronic kidney disease: Secondary | ICD-10-CM | POA: Diagnosis not present

## 2022-08-24 DIAGNOSIS — E119 Type 2 diabetes mellitus without complications: Secondary | ICD-10-CM | POA: Diagnosis not present

## 2022-08-24 DIAGNOSIS — S14122A Central cord syndrome at C2 level of cervical spinal cord, initial encounter: Secondary | ICD-10-CM | POA: Diagnosis not present

## 2022-08-24 DIAGNOSIS — Z743 Need for continuous supervision: Secondary | ICD-10-CM | POA: Diagnosis not present

## 2022-08-24 DIAGNOSIS — Y92009 Unspecified place in unspecified non-institutional (private) residence as the place of occurrence of the external cause: Secondary | ICD-10-CM | POA: Diagnosis not present

## 2022-08-24 DIAGNOSIS — I252 Old myocardial infarction: Secondary | ICD-10-CM

## 2022-08-24 DIAGNOSIS — Z7901 Long term (current) use of anticoagulants: Secondary | ICD-10-CM

## 2022-08-24 DIAGNOSIS — S066X0A Traumatic subarachnoid hemorrhage without loss of consciousness, initial encounter: Principal | ICD-10-CM | POA: Diagnosis present

## 2022-08-24 DIAGNOSIS — E785 Hyperlipidemia, unspecified: Secondary | ICD-10-CM | POA: Diagnosis not present

## 2022-08-24 DIAGNOSIS — Z79890 Hormone replacement therapy (postmenopausal): Secondary | ICD-10-CM | POA: Diagnosis not present

## 2022-08-24 DIAGNOSIS — S14109A Unspecified injury at unspecified level of cervical spinal cord, initial encounter: Secondary | ICD-10-CM | POA: Diagnosis not present

## 2022-08-24 DIAGNOSIS — S12090A Other displaced fracture of first cervical vertebra, initial encounter for closed fracture: Principal | ICD-10-CM

## 2022-08-24 DIAGNOSIS — R918 Other nonspecific abnormal finding of lung field: Secondary | ICD-10-CM | POA: Diagnosis not present

## 2022-08-24 DIAGNOSIS — I959 Hypotension, unspecified: Secondary | ICD-10-CM | POA: Diagnosis not present

## 2022-08-24 DIAGNOSIS — E1143 Type 2 diabetes mellitus with diabetic autonomic (poly)neuropathy: Secondary | ICD-10-CM | POA: Diagnosis not present

## 2022-08-24 DIAGNOSIS — Z888 Allergy status to other drugs, medicaments and biological substances status: Secondary | ICD-10-CM

## 2022-08-24 DIAGNOSIS — Z8674 Personal history of sudden cardiac arrest: Secondary | ICD-10-CM | POA: Diagnosis not present

## 2022-08-24 DIAGNOSIS — I2699 Other pulmonary embolism without acute cor pulmonale: Secondary | ICD-10-CM | POA: Diagnosis not present

## 2022-08-24 DIAGNOSIS — I5030 Unspecified diastolic (congestive) heart failure: Secondary | ICD-10-CM | POA: Diagnosis not present

## 2022-08-24 DIAGNOSIS — N179 Acute kidney failure, unspecified: Secondary | ICD-10-CM | POA: Diagnosis not present

## 2022-08-24 DIAGNOSIS — E8722 Chronic metabolic acidosis: Secondary | ICD-10-CM | POA: Diagnosis not present

## 2022-08-24 DIAGNOSIS — K56609 Unspecified intestinal obstruction, unspecified as to partial versus complete obstruction: Secondary | ICD-10-CM | POA: Diagnosis not present

## 2022-08-24 DIAGNOSIS — N1831 Chronic kidney disease, stage 3a: Secondary | ICD-10-CM | POA: Diagnosis present

## 2022-08-24 DIAGNOSIS — Z9049 Acquired absence of other specified parts of digestive tract: Secondary | ICD-10-CM

## 2022-08-24 DIAGNOSIS — I459 Conduction disorder, unspecified: Secondary | ICD-10-CM | POA: Diagnosis not present

## 2022-08-24 DIAGNOSIS — Z803 Family history of malignant neoplasm of breast: Secondary | ICD-10-CM

## 2022-08-24 DIAGNOSIS — E1159 Type 2 diabetes mellitus with other circulatory complications: Secondary | ICD-10-CM | POA: Diagnosis not present

## 2022-08-24 DIAGNOSIS — E1122 Type 2 diabetes mellitus with diabetic chronic kidney disease: Secondary | ICD-10-CM | POA: Diagnosis not present

## 2022-08-24 DIAGNOSIS — G319 Degenerative disease of nervous system, unspecified: Secondary | ICD-10-CM | POA: Diagnosis present

## 2022-08-24 DIAGNOSIS — I5042 Chronic combined systolic (congestive) and diastolic (congestive) heart failure: Secondary | ICD-10-CM | POA: Diagnosis not present

## 2022-08-24 DIAGNOSIS — S3993XA Unspecified injury of pelvis, initial encounter: Secondary | ICD-10-CM | POA: Diagnosis not present

## 2022-08-24 DIAGNOSIS — E8729 Other acidosis: Secondary | ICD-10-CM | POA: Diagnosis not present

## 2022-08-24 DIAGNOSIS — E876 Hypokalemia: Secondary | ICD-10-CM | POA: Diagnosis present

## 2022-08-24 DIAGNOSIS — Z95828 Presence of other vascular implants and grafts: Secondary | ICD-10-CM | POA: Diagnosis not present

## 2022-08-24 DIAGNOSIS — M4802 Spinal stenosis, cervical region: Secondary | ICD-10-CM | POA: Diagnosis not present

## 2022-08-24 DIAGNOSIS — I5032 Chronic diastolic (congestive) heart failure: Secondary | ICD-10-CM | POA: Diagnosis not present

## 2022-08-24 DIAGNOSIS — E872 Acidosis, unspecified: Secondary | ICD-10-CM | POA: Diagnosis not present

## 2022-08-24 DIAGNOSIS — Z86711 Personal history of pulmonary embolism: Secondary | ICD-10-CM

## 2022-08-24 DIAGNOSIS — T794XXA Traumatic shock, initial encounter: Secondary | ICD-10-CM | POA: Diagnosis not present

## 2022-08-24 DIAGNOSIS — S12000A Unspecified displaced fracture of first cervical vertebra, initial encounter for closed fracture: Secondary | ICD-10-CM | POA: Diagnosis not present

## 2022-08-24 DIAGNOSIS — W01198A Fall on same level from slipping, tripping and stumbling with subsequent striking against other object, initial encounter: Secondary | ICD-10-CM | POA: Diagnosis present

## 2022-08-24 DIAGNOSIS — E46 Unspecified protein-calorie malnutrition: Secondary | ICD-10-CM | POA: Diagnosis not present

## 2022-08-24 DIAGNOSIS — S066XAA Traumatic subarachnoid hemorrhage with loss of consciousness status unknown, initial encounter: Secondary | ICD-10-CM | POA: Diagnosis present

## 2022-08-24 DIAGNOSIS — R9431 Abnormal electrocardiogram [ECG] [EKG]: Secondary | ICD-10-CM | POA: Diagnosis not present

## 2022-08-24 DIAGNOSIS — Z4801 Encounter for change or removal of surgical wound dressing: Secondary | ICD-10-CM | POA: Diagnosis not present

## 2022-08-24 DIAGNOSIS — R609 Edema, unspecified: Secondary | ICD-10-CM | POA: Diagnosis not present

## 2022-08-24 DIAGNOSIS — G9520 Unspecified cord compression: Secondary | ICD-10-CM | POA: Diagnosis not present

## 2022-08-24 DIAGNOSIS — G9589 Other specified diseases of spinal cord: Secondary | ICD-10-CM | POA: Diagnosis not present

## 2022-08-24 DIAGNOSIS — E1140 Type 2 diabetes mellitus with diabetic neuropathy, unspecified: Secondary | ICD-10-CM | POA: Diagnosis not present

## 2022-08-24 DIAGNOSIS — E7800 Pure hypercholesterolemia, unspecified: Secondary | ICD-10-CM

## 2022-08-24 DIAGNOSIS — M47812 Spondylosis without myelopathy or radiculopathy, cervical region: Secondary | ICD-10-CM | POA: Diagnosis present

## 2022-08-24 DIAGNOSIS — I609 Nontraumatic subarachnoid hemorrhage, unspecified: Secondary | ICD-10-CM | POA: Diagnosis not present

## 2022-08-24 DIAGNOSIS — Z880 Allergy status to penicillin: Secondary | ICD-10-CM

## 2022-08-24 DIAGNOSIS — R58 Hemorrhage, not elsewhere classified: Secondary | ICD-10-CM | POA: Diagnosis not present

## 2022-08-24 DIAGNOSIS — J9601 Acute respiratory failure with hypoxia: Secondary | ICD-10-CM | POA: Diagnosis not present

## 2022-08-24 DIAGNOSIS — S299XXA Unspecified injury of thorax, initial encounter: Secondary | ICD-10-CM | POA: Diagnosis not present

## 2022-08-24 DIAGNOSIS — R296 Repeated falls: Secondary | ICD-10-CM | POA: Diagnosis present

## 2022-08-24 DIAGNOSIS — E860 Dehydration: Secondary | ICD-10-CM | POA: Diagnosis present

## 2022-08-24 DIAGNOSIS — R000 Tachycardia, unspecified: Secondary | ICD-10-CM | POA: Diagnosis not present

## 2022-08-24 DIAGNOSIS — S12111A Posterior displaced Type II dens fracture, initial encounter for closed fracture: Secondary | ICD-10-CM | POA: Diagnosis present

## 2022-08-24 DIAGNOSIS — R1310 Dysphagia, unspecified: Secondary | ICD-10-CM | POA: Diagnosis not present

## 2022-08-24 DIAGNOSIS — E871 Hypo-osmolality and hyponatremia: Secondary | ICD-10-CM | POA: Diagnosis not present

## 2022-08-24 DIAGNOSIS — Z79899 Other long term (current) drug therapy: Secondary | ICD-10-CM

## 2022-08-24 DIAGNOSIS — D696 Thrombocytopenia, unspecified: Secondary | ICD-10-CM | POA: Diagnosis not present

## 2022-08-24 DIAGNOSIS — S14121A Central cord syndrome at C1 level of cervical spinal cord, initial encounter: Secondary | ICD-10-CM | POA: Diagnosis not present

## 2022-08-24 DIAGNOSIS — R791 Abnormal coagulation profile: Secondary | ICD-10-CM | POA: Diagnosis present

## 2022-08-24 DIAGNOSIS — R6889 Other general symptoms and signs: Secondary | ICD-10-CM | POA: Diagnosis not present

## 2022-08-24 DIAGNOSIS — S12110A Anterior displaced Type II dens fracture, initial encounter for closed fracture: Secondary | ICD-10-CM | POA: Diagnosis not present

## 2022-08-24 DIAGNOSIS — Z881 Allergy status to other antibiotic agents status: Secondary | ICD-10-CM

## 2022-08-24 DIAGNOSIS — I444 Left anterior fascicular block: Secondary | ICD-10-CM | POA: Diagnosis not present

## 2022-08-24 DIAGNOSIS — Z933 Colostomy status: Secondary | ICD-10-CM | POA: Diagnosis not present

## 2022-08-24 DIAGNOSIS — M159 Polyosteoarthritis, unspecified: Secondary | ICD-10-CM | POA: Diagnosis present

## 2022-08-24 LAB — PROTIME-INR
INR: 3.5 — ABNORMAL HIGH (ref 0.8–1.2)
Prothrombin Time: 35.4 seconds — ABNORMAL HIGH (ref 11.4–15.2)

## 2022-08-24 LAB — I-STAT CHEM 8, ED
BUN: 19 mg/dL (ref 8–23)
Calcium, Ion: 1.09 mmol/L — ABNORMAL LOW (ref 1.15–1.40)
Chloride: 102 mmol/L (ref 98–111)
Creatinine, Ser: 0.9 mg/dL (ref 0.44–1.00)
Glucose, Bld: 109 mg/dL — ABNORMAL HIGH (ref 70–99)
HCT: 48 % — ABNORMAL HIGH (ref 36.0–46.0)
Hemoglobin: 16.3 g/dL — ABNORMAL HIGH (ref 12.0–15.0)
Potassium: 3.6 mmol/L (ref 3.5–5.1)
Sodium: 134 mmol/L — ABNORMAL LOW (ref 135–145)
TCO2: 18 mmol/L — ABNORMAL LOW (ref 22–32)

## 2022-08-24 LAB — CBC
HCT: 46.7 % — ABNORMAL HIGH (ref 36.0–46.0)
Hemoglobin: 14.5 g/dL (ref 12.0–15.0)
MCH: 29.5 pg (ref 26.0–34.0)
MCHC: 31 g/dL (ref 30.0–36.0)
MCV: 95.1 fL (ref 80.0–100.0)
Platelets: 156 10*3/uL (ref 150–400)
RBC: 4.91 MIL/uL (ref 3.87–5.11)
RDW: 17.2 % — ABNORMAL HIGH (ref 11.5–15.5)
WBC: 8.8 10*3/uL (ref 4.0–10.5)
nRBC: 0 % (ref 0.0–0.2)

## 2022-08-24 LAB — COMPREHENSIVE METABOLIC PANEL
ALT: 12 U/L (ref 0–44)
AST: 18 U/L (ref 15–41)
Albumin: 2.8 g/dL — ABNORMAL LOW (ref 3.5–5.0)
Alkaline Phosphatase: 50 U/L (ref 38–126)
Anion gap: 18 — ABNORMAL HIGH (ref 5–15)
BUN: 18 mg/dL (ref 8–23)
CO2: 15 mmol/L — ABNORMAL LOW (ref 22–32)
Calcium: 8.7 mg/dL — ABNORMAL LOW (ref 8.9–10.3)
Chloride: 101 mmol/L (ref 98–111)
Creatinine, Ser: 1.36 mg/dL — ABNORMAL HIGH (ref 0.44–1.00)
GFR, Estimated: 39 mL/min — ABNORMAL LOW (ref >60)
Glucose, Bld: 107 mg/dL — ABNORMAL HIGH (ref 70–99)
Potassium: 3.6 mmol/L (ref 3.5–5.1)
Sodium: 134 mmol/L — ABNORMAL LOW (ref 135–145)
Total Bilirubin: 1.4 mg/dL — ABNORMAL HIGH (ref 0.3–1.2)
Total Protein: 5.9 g/dL — ABNORMAL LOW (ref 6.5–8.1)

## 2022-08-24 LAB — I-STAT CG4 LACTIC ACID, ED: Lactic Acid, Venous: 2.2 mmol/L (ref 0.5–1.9)

## 2022-08-24 LAB — ETHANOL: Alcohol, Ethyl (B): <10 mg/dL (ref <10)

## 2022-08-24 LAB — SAMPLE TO BLOOD BANK

## 2022-08-24 MED ORDER — MELATONIN 3 MG PO TABS: 3.0000 mg | ORAL_TABLET | Freq: Every evening | ORAL | Status: DC | PRN

## 2022-08-24 MED ORDER — ACETAMINOPHEN 325 MG PO TABS
650.0000 mg | ORAL_TABLET | Freq: Four times a day (QID) | ORAL | Status: DC | PRN
  Administered 2022-08-25 – 2022-08-27 (×3): 650 mg via ORAL
  Filled 2022-08-24 (×3): qty 2

## 2022-08-24 MED ORDER — ONDANSETRON HCL 4 MG/2ML IJ SOLN
4.0000 mg | Freq: Four times a day (QID) | INTRAMUSCULAR | Status: DC | PRN
  Administered 2022-08-26 – 2022-08-27 (×2): 4 mg via INTRAVENOUS
  Filled 2022-08-24 (×2): qty 2

## 2022-08-24 MED ORDER — ACETAMINOPHEN 650 MG RE SUPP: 650.0000 mg | Freq: Four times a day (QID) | RECTAL | Status: DC | PRN

## 2022-08-24 MED ORDER — PROTHROMBIN COMPLEX CONC HUMAN 500 UNITS IV KIT
1552.0000 [IU] | PACK | Status: AC
  Administered 2022-08-24: 1552 [IU] via INTRAVENOUS
  Filled 2022-08-24: qty 1552

## 2022-08-24 MED ORDER — VITAMIN K1 10 MG/ML IJ SOLN
10.0000 mg | INTRAVENOUS | Status: AC
  Administered 2022-08-24: 10 mg via INTRAVENOUS
  Filled 2022-08-24: qty 1

## 2022-08-24 MED ORDER — HYDRALAZINE HCL 20 MG/ML IJ SOLN: 10.0000 mg | INTRAMUSCULAR | Status: DC | PRN

## 2022-08-24 MED ORDER — SODIUM CHLORIDE 0.9 % IV BOLUS
1000.0000 mL | Freq: Once | INTRAVENOUS | Status: AC
  Administered 2022-08-24: 1000 mL via INTRAVENOUS

## 2022-08-24 MED ORDER — PROTHROMBIN COMPLEX CONC HUMAN 500 UNITS IV KIT: 25.0000 [IU]/kg | PACK | Status: DC

## 2022-08-24 MED ORDER — ONDANSETRON HCL 4 MG/2ML IJ SOLN
4.0000 mg | Freq: Once | INTRAMUSCULAR | Status: AC
  Administered 2022-08-24: 4 mg via INTRAVENOUS
  Filled 2022-08-24: qty 2

## 2022-08-24 NOTE — ED Triage Notes (Signed)
Pt arrives to ED for mechanical fall on thinners. Pt endorses head strike, unknown LOC. Pt on Coumadin. No obvious deformities A/o x 4

## 2022-08-24 NOTE — ED Triage Notes (Signed)
Trauma Response Nurse Documentation   Hannah [name] is a 82 y.o. female arriving to [facility] ED via [facility]  On warfarin daily. Trauma was activated as a Level 2 by Beverely Risen based on the following trauma criteria Elderly patients > 65 with head trauma on anti-coagulation (excluding ASA).  Patient cleared for CT by Dr. Adela Lank. Pt transported to CT with trauma response nurse present to monitor. RN remained with the patient throughout their absence from the department for clinical observation.   GCS 15.  History   Past Medical History:  Diagnosis Date   Acute massive pulmonary embolism (HCC) 07/13/2012   Massive PE w/ PEA arrest 07/13/12 >TNK >IVC filter >discharged on comadin     Anticoagulated on warfarin    Chronic diastolic CHF (congestive heart failure) (HCC) 03/15/2017   Chronic kidney disease, stage 3a (HCC) 12/15/2018   Diabetes mellitus, type 2 (HCC) 07/15/2012   with peripheral neuropathy   DM2 (diabetes mellitus, type 2) (HCC) 07/23/2012   HFrEF (heart failure with reduced ejection fraction) (HCC) 03/15/2017   Non-ischemic cardiomyopathy // Cardiac catheterization 01/01/21:  RCA prox to mid 20 // Echocardiogram 12/26/20:  EF 30-35, ant septum/apex, mid inf-sept AK; ant-lat/ant/inf HK, Gr 1 DD, mild LVH, normal RVSF, RVSP 27.2, small effusion (ant to RV), mild MR, mod TR, AV sclerosis w/o AS   Hyperlipemia 07/14/2012   Hyperlipidemia 02/25/2013   Hypertension 07/14/2012   Hypothyroid 06/06/2013   Large bowel stricture (HCC)    s/p colectomy in 2016   Neuropathy 02/12/2015   Osteoarthritis    s/p hip and knee replacements   PAF (paroxysmal atrial fibrillation) (HCC)    Pulmonary hypertension (HCC) 07/26/2016   Syncope 10/2018   Thyroid disease      Past Surgical History:  Procedure Laterality Date   COLONOSCOPY N/A 03/12/2016   Procedure: COLONOSCOPY;  Surgeon: Dorena Cookey, MD;  Location: [facility] ENDOSCOPY;  Service: Endoscopy;  Laterality: N/A;   COLOSTOMY N/A  06/03/2014   Procedure: COLOSTOMY;  Surgeon: Harriette Bouillon, MD;  Location: [facility] OR;  Service: General;  Laterality: N/A;   FLEXIBLE SIGMOIDOSCOPY N/A 05/30/2014   Procedure: Arnell Sieving;  Surgeon: Jeani Hawking, MD;  Location: [facility] ENDOSCOPY;  Service: Endoscopy;  Laterality: N/A;   INSERTION OF VENA CAVA FILTER N/A 07/16/2012   Procedure: INSERTION OF VENA CAVA FILTER;  Surgeon: [name], MD;  Location: [facility] CATH LAB;  Service: Cardiovascular;  Laterality: N/A;   KNEE ARTHROSCOPY     LEFT HEART CATH AND CORONARY ANGIOGRAPHY N/A 01/01/2021   Procedure: LEFT HEART CATH AND CORONARY ANGIOGRAPHY;  Surgeon: Kathleene Hazel, MD;  Location: [facility] INVASIVE CV LAB;  Service: Cardiovascular;  Laterality: N/A;   LEFT HEART CATH AND CORONARY ANGIOGRAPHY N/A 04/29/2022   Procedure: LEFT HEART CATH AND CORONARY ANGIOGRAPHY;  Surgeon: Orbie Pyo, MD;  Location: [facility] INVASIVE CV LAB;  Service: Cardiovascular;  Laterality: N/A;   PARTIAL COLECTOMY N/A 06/03/2014   Procedure: PARTIAL COLECTOMY;  Surgeon: Harriette Bouillon, MD;  Location: [facility] OR;  Service: General;  Laterality: N/A;   REDUCTION MAMMAPLASTY Bilateral    RIGHT HEART CATH N/A 12/30/2020   Procedure: RIGHT HEART CATH;  Surgeon: Kathleene Hazel, MD;  Location: [facility] INVASIVE CV LAB;  Service: Cardiovascular;  Laterality: N/A;   SP ARTHRO HIP*L*         Initial Focused Assessment (If applicable, or please see trauma documentation): Airway-- intact, no visible obstruction Breathing-- spontaneous, unlabored Circulation-- dried blood to the nose, no  active bleeding on arrival to department  CT's Completed:   CT Head and CT C-Spine   Interventions:  See event note  Plan for disposition:  {Trauma Dispo:26867}   Consults completed:  Neurosurgeon at 2130.  Event Summary: Patient brought in by [facility] from home. Patient had a mechanical fall this evening. Struck her head on the ground. On arrival patient alert  and oriented x4, GCS 15. Patient transferred from EMS stretcher to hospital stretcher. Manual BP obtained, 114/74. 20G PIV established right forearm. Patient log rolled by team, patient denied any pain or tenderness. Xray chest and pelvis completed. Patient to CT with TRN and Primary RN. CT head, c-spine completed. Patient back to trauma bay at this time.   MTP Summary (If applicable):  N/A  Bedside handoff with ED RN [facility].    Leota Sauers  Trauma Response RN  Please call TRN at [phone] for further assistance.

## 2022-08-24 NOTE — ED Notes (Signed)
Pt placed in [place] per MD order

## 2022-08-24 NOTE — ED Provider Notes (Signed)
[place] EMERGENCY DEPARTMENT AT [facility] Provider Note   CSN: [id number] Arrival date & time: 08/24/22  2015     History  Chief Complaint  Patient presents with   Hannah [name]    Hannah [name] is a 82 y.o. female.  82 yo F with a cc of fall.  Patient said that she was trying to get the door and she ended up on the ground.  She is not sure how exactly.  She knows that she struck her head but denies any other obvious injury.  Tells me she is not hurting anywhere.  Says that she has been eating and drinking normally denies nausea or vomiting.  She feels kind of weak all over now.   Fall       Home Medications Prior to Admission medications   Medication Sig Start Date End Date Taking? Authorizing Provider  acetaminophen (TYLENOL) 500 MG tablet Take 1,000 mg by mouth every 6 (six) hours as needed for mild pain.   Yes [provider]  diclofenac Sodium (VOLTAREN ARTHRITIS PAIN) 1 % GEL Apply 2 g topically as needed.   Yes [provider]  furosemide (LASIX) 20 MG tablet Take 1 tablet (20 mg total) by mouth daily as needed for fluid or edema. 03/10/21  Yes Koberlein, Paris Lore, MD  nitroGLYCERIN (NITROSTAT) 0.4 MG SL tablet Place 1 tablet (0.4 mg total) under the tongue every 5 (five) minutes as needed for chest pain. 04/30/22  Yes Willeen Niece, MD  warfarin (COUMADIN) 2.5 MG tablet Take 1 tablet (2.5 mg total) by mouth daily. 06/29/22  Yes Pricilla Riffle, MD  atorvastatin (LIPITOR) 40 MG tablet Take 1 tablet (40 mg total) by mouth daily. 03/03/22   [name], MD  dapagliflozin propanediol (FARXIGA) 10 MG TABS tablet Take 1 tablet (10 mg total) by mouth daily before breakfast. 12/23/21   Pricilla Riffle, MD  DULoxetine (CYMBALTA) 30 MG capsule Take 1 capsule (30 mg total) by mouth daily. 07/25/22   [name], MD  glucose blood [facility] VERIO) test strip USE 1 STRIP TO CHECK GLUCOSE ONCE DAILY AS DIRECTED 05/13/22   [name], MD  metoprolol  succinate (TOPROL XL) 25 MG 24 hr tablet Take 1 tablet (25 mg total) by mouth daily. 06/08/22 06/08/23  Sherald Hess, NP  OneTouch Delica Lancets 30G MISC USE 1  TO CHECK GLUCOSE ONCE DAILY 05/13/22   [name], MD  pantoprazole (PROTONIX) 40 MG tablet Take 1 tablet (40 mg total) by mouth daily. 02/26/21   Koberlein, Paris Lore, MD  sacubitril-valsartan (ENTRESTO) 24-26 MG Take 1 tablet by mouth 2 (two) times daily. 12/14/21   [name], MD  spironolactone (ALDACTONE) 25 MG tablet Take 0.5 tablets (12.5 mg total) by mouth daily. 06/27/22   Andrey Farmer, PA-C  vitamin B-12 (CYANOCOBALAMIN) 1000 MCG tablet Take 1 tablet (1,000 mcg total) by mouth daily. 05/15/20   Wynn Banker, MD  vitamin C (ASCORBIC ACID) 500 MG tablet Take 500 mg by mouth daily.     [provider]      Allergies    Augmentin [amoxicillin-pot clavulanate], Tranxene [clorazepate], Crestor [rosuvastatin], and Penicillins    Review of Systems   Review of Systems  Physical Exam Updated Vital Signs BP 117/62   Pulse 89   Temp (!) 97.5 F (36.4 C) (Oral)   Resp 17   Ht 5\' 7"  (1.702 m)   Wt 87.5 kg   SpO2 100%  BMI 30.23 kg/m  Physical Exam Vitals and nursing note reviewed.  Constitutional:      General: She is not in acute distress.    Appearance: She is well-developed. She is not diaphoretic.  HENT:     Head: Normocephalic and atraumatic.  Eyes:     Pupils: Pupils are equal, round, and reactive to light.  Cardiovascular:     Rate and Rhythm: Normal rate and regular rhythm.     Heart sounds: No murmur heard.    No friction rub. No gallop.  Pulmonary:     Effort: Pulmonary effort is normal.     Breath sounds: No wheezing or rales.  Abdominal:     General: There is no distension.     Palpations: Abdomen is soft.     Tenderness: There is no abdominal tenderness.  Musculoskeletal:        General: No tenderness.     Cervical back: Normal range of motion and neck supple.   Skin:    General: Skin is warm and dry.  Neurological:     Mental Status: She is alert and oriented to person, place, and time.  Psychiatric:        Behavior: Behavior normal.     ED Results / Procedures / Treatments   Labs (all labs ordered are listed, but only abnormal results are displayed) Labs Reviewed  COMPREHENSIVE METABOLIC PANEL - Abnormal; Notable for the following components:      Result Value   Sodium 134 (*)    CO2 15 (*)    Glucose, Bld 107 (*)    Creatinine, Ser 1.36 (*)    Calcium 8.7 (*)    Total Protein 5.9 (*)    Albumin 2.8 (*)    Total Bilirubin 1.4 (*)    GFR, Estimated 39 (*)    Anion gap 18 (*)    All other components within normal limits  CBC - Abnormal; Notable for the following components:   HCT 46.7 (*)    RDW 17.2 (*)    All other components within normal limits  PROTIME-INR - Abnormal; Notable for the following components:   Prothrombin Time 35.4 (*)    INR 3.5 (*)    All other components within normal limits  I-STAT CHEM 8, ED - Abnormal; Notable for the following components:   Sodium 134 (*)    Glucose, Bld 109 (*)    Calcium, Ion 1.09 (*)    TCO2 18 (*)    Hemoglobin 16.3 (*)    HCT 48.0 (*)    All other components within normal limits  I-STAT CG4 LACTIC ACID, ED - Abnormal; Notable for the following components:   Lactic Acid, Venous 2.2 (*)    All other components within normal limits  ETHANOL  URINALYSIS, ROUTINE W REFLEX MICROSCOPIC  PROTIME-INR  SAMPLE TO BLOOD BANK    EKG None  Radiology CT HEAD WO CONTRAST  Result Date: 08/24/2022 CLINICAL DATA:  Fall, on blood thinners EXAM: CT HEAD WITHOUT CONTRAST CT CERVICAL SPINE WITHOUT CONTRAST TECHNIQUE: Multidetector CT imaging of the head and cervical spine was performed following the standard protocol without intravenous contrast. Multiplanar CT image reconstructions of the cervical spine were also generated. RADIATION DOSE REDUCTION: This exam was performed according to the  departmental dose-optimization program which includes automated exposure control, adjustment of the mA and/or kV according to patient size and/or use of iterative reconstruction technique. COMPARISON:  07/21/2022 CT head and cervical spine FINDINGS: CT HEAD FINDINGS Brain: Suspect trace  subarachnoid hemorrhage along the right frontal lobe (series 2, image 22), although evaluation is somewhat limited by beam hardening artifact. No parenchymal hemorrhage. No evidence of acute infarct, mass, mass effect, or midline shift. No hydrocephalus or extra-axial fluid collection. Partial empty sella. Age related cerebral atrophy. Periventricular white matter changes, likely the sequela of chronic small vessel ischemic disease. Vascular: No hyperdense vessel. Skull: Negative for fracture or focal lesion. Frontal scalp hematoma. Sinuses/Orbits: Small mucous retention cyst in the right maxillary sinus. Otherwise clear paranasal sinuses. No acute finding in the orbits. Other: The mastoid air cells are well aerated. CT CERVICAL SPINE FINDINGS Alignment: No traumatic listhesis. Skull base and vertebrae: Acute fracture through the base of the dens, extending into the posterior body (type 2) (series 8, images 29-33), which is posteriorly displaced approximately 4 mm. Bilateral fractures through the anterior arch of C1 (series 5, image 23), with approximately 4 mm of displacement. The fractures do not appear to involve the vertebral artery foramina. Osteopenia. Soft tissues and spinal canal: Trace prevertebral fluid. Suspect a small amount of hemorrhage along the dorsal aspect of C2, without significant mass effect on the thecal sac. 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 Upper chest: Negative. IMPRESSION: 1. Suspect trace subarachnoid  hemorrhage along the right frontal lobe, although evaluation is somewhat limited by beam hardening artifact. 2. Acute fracture through the base of the dens, extending into the posterior body (type 2) which is posteriorly displaced approximately 4 mm. 3. Bilateral fractures through the anterior arch of C1, with approximately 4 mm of displacement. 4. Suspect a small amount of hemorrhage along the dorsal aspect of C2, without significant mass effect on the thecal sac. This could be further evaluated with MRI. These results were called by telephone at the time of interpretation on 08/24/2022 at 9:21 pm to provider Blessen Kimbrough , who verbally acknowledged these results. Electronically Signed   By: Wiliam Ke M.D.   On: 08/24/2022 21:23   CT CERVICAL SPINE WO CONTRAST  Result Date: 08/24/2022 CLINICAL DATA:  Fall, on blood thinners EXAM: CT HEAD WITHOUT CONTRAST CT CERVICAL SPINE WITHOUT CONTRAST TECHNIQUE: Multidetector CT imaging of the head and cervical spine was performed following the standard protocol without intravenous contrast. Multiplanar CT image reconstructions of the cervical spine were also generated. RADIATION DOSE REDUCTION: This exam was performed according to the departmental dose-optimization program which includes automated exposure control, adjustment of the mA and/or kV according to patient size and/or use of iterative reconstruction technique. COMPARISON:  07/21/2022 CT head and cervical spine FINDINGS: CT HEAD FINDINGS Brain: Suspect trace subarachnoid hemorrhage along the right frontal lobe (series 2, image 22), although evaluation is somewhat limited by beam hardening artifact. No parenchymal hemorrhage. No evidence of acute infarct, mass, mass effect, or midline shift. No hydrocephalus or extra-axial fluid collection. Partial empty sella. Age related cerebral atrophy. Periventricular white matter changes, likely the sequela of chronic small vessel ischemic disease. Vascular: No hyperdense  vessel. Skull: Negative for fracture or focal lesion. Frontal scalp hematoma. Sinuses/Orbits: Small mucous retention cyst in the right maxillary sinus. Otherwise clear paranasal sinuses. No acute finding in the orbits. Other: The mastoid air cells are well aerated. CT CERVICAL SPINE FINDINGS Alignment: No traumatic listhesis. Skull base and vertebrae: Acute fracture through the base of the dens, extending into the posterior body (type 2) (  series 8, images 29-33), which is posteriorly displaced approximately 4 mm. Bilateral fractures through the anterior arch of C1 (series 5, image 23), with approximately 4 mm of displacement. The fractures do not appear to involve the vertebral artery foramina. Osteopenia. Soft tissues and spinal canal: Trace prevertebral fluid. Suspect a small amount of hemorrhage along the dorsal aspect of C2, without significant mass effect on the thecal sac. 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 Upper chest: Negative. IMPRESSION: 1. Suspect trace subarachnoid hemorrhage along the right frontal lobe, although evaluation is somewhat limited by beam hardening artifact. 2. Acute fracture through the base of the dens, extending into the posterior body (type 2) which is posteriorly displaced approximately 4 mm. 3. Bilateral fractures through the anterior arch of C1, with approximately 4 mm of displacement. 4. Suspect a small amount of hemorrhage along the dorsal aspect of C2, without significant mass effect on the thecal sac. This could be further evaluated with MRI. These results were called by telephone at the time of interpretation on 08/24/2022 at 9:21 pm to provider Lenola Lockner , who verbally acknowledged these results. Electronically Signed   By: Wiliam Ke M.D.   On: 08/24/2022 21:23   DG Chest  Port 1 View  Result Date: 08/24/2022 CLINICAL DATA:  Trauma EXAM: PORTABLE CHEST 1 VIEW COMPARISON:  CT of the chest 12/15/2018 FINDINGS: There is prominence of the right paratracheal region corresponding to ectatic vasculature, unchanged. Heart size is within normal limits. There is no focal lung infiltrate, pleural effusion or pneumothorax. No acute fractures are seen. IMPRESSION: No active disease. Electronically Signed   By: Darliss Cheney M.D.   On: 08/24/2022 20:55   DG Pelvis Portable  Result Date: 08/24/2022 CLINICAL DATA:  Level 2 trauma on blood thinners. EXAM: PORTABLE PELVIS 1-2 VIEWS COMPARISON:  07/21/2022 FINDINGS: Left THA. No radiographic evidence of loosening. No acute fracture or dislocation. IVC filter. IMPRESSION: No evidence of acute fracture or dislocation. Electronically Signed   By: Minerva Fester M.D.   On: 08/24/2022 20:55    Procedures .Critical Care  Performed by: Melene Plan, DO Authorized by: Melene Plan, DO   Critical care provider statement:    Critical care time (minutes):  80   Critical care time was exclusive of:  Separately billable procedures and treating other patients   Critical care was time spent personally by me on the following activities:  Development of treatment plan with patient or surrogate, discussions with consultants, evaluation of patient's response to treatment, examination of patient, ordering and review of laboratory studies, ordering and review of radiographic studies, ordering and performing treatments and interventions, pulse oximetry, re-evaluation of patient's condition and review of old charts   Care discussed with: admitting provider       Medications Ordered in ED Medications  phytonadione (VITAMIN K) 10 mg in dextrose 5 % 50 mL IVPB (10 mg Intravenous New Bag/Given 08/24/22 2312)  ondansetron (ZOFRAN) injection 4 mg (has no administration in time range)  sodium chloride 0.9 % bolus 1,000 mL (0 mLs Intravenous Stopped 08/24/22 2157)   sodium chloride 0.9 % bolus 1,000 mL (1,000 mLs Intravenous New Bag/Given 08/24/22 2157)  prothrombin complex conc human (KCENTRA) IVPB 1,552 Units (0 Units Intravenous Stopped 08/24/22 2314)    ED Course/ Medical Decision Making/ A&P  Medical Decision Making Amount and/or Complexity of Data Reviewed Labs: ordered. Radiology: ordered.  Risk Prescription drug management. Decision regarding hospitalization.   82 yo F with a chief complaints of fall.  Is hard to tell if this is syncopal or not.  Patient has no specific complaints but has a very large frontal hematoma.  Patient is on Coumadin.  She arrived as a level 2 trauma per our protocol.  Will CT head and C-spine.  Plain film of the chest and pelvis independently interpreted by me without obvious pneumothorax, no obvious open book pelvic fracture.  Will obtain blood work.  Patient's blood pressure was a bit soft we will give a bolus of IV fluids.  Patient's blood pressure has improved with IV fluids.  I discussed the imaging with the radiologist, patient has a small intracranial hemorrhage as well as has a fracture of her C1 and C2.  I discussed case with neurosurgery recommended medical admission.  Nonoperative.  Did recommended reversing her INR.  Recommended repeating her head CT.  The patients results and plan were reviewed and discussed.   Any x-rays performed were independently reviewed by myself.   Differential diagnosis were considered with the presenting HPI.  Medications  phytonadione (VITAMIN K) 10 mg in dextrose 5 % 50 mL IVPB (10 mg Intravenous New Bag/Given 08/24/22 2312)  ondansetron (ZOFRAN) injection 4 mg (has no administration in time range)  sodium chloride 0.9 % bolus 1,000 mL (0 mLs Intravenous Stopped 08/24/22 2157)  sodium chloride 0.9 % bolus 1,000 mL (1,000 mLs Intravenous New Bag/Given 08/24/22 2157)  prothrombin complex conc human (KCENTRA) IVPB 1,552 Units (0 Units Intravenous  Stopped 08/24/22 2314)    Vitals:   08/24/22 2200 08/24/22 2210 08/24/22 2245 08/24/22 2255  BP: 107/62  117/62   Pulse: 82 80 84 89  Resp:  11  17  Temp:      TempSrc:      SpO2: 100% 100% 100% 100%  Weight:      Height:        Final diagnoses:  Other closed displaced fracture of first cervical vertebra, initial encounter (HCC)  Closed odontoid fracture with type II morphology and posterior displacement, initial encounter [facility])    Admission/ observation were discussed with the admitting physician, patient and/or family and they are comfortable with the plan.          Final Clinical Impression(s) / ED Diagnoses Final diagnoses:  Other closed displaced fracture of first cervical vertebra, initial encounter (HCC)  Closed odontoid fracture with type II morphology and posterior displacement, initial encounter [facility])    Rx / DC Orders ED Discharge Orders     None         Melene Plan, DO 08/24/22 2317

## 2022-08-24 NOTE — Progress Notes (Signed)
   08/24/22 2000  Spiritual Encounters  Type of Visit Initial  Care provided to: Patient  Conversation partners present during encounter Nurse  Referral source Trauma page  Reason for visit Trauma  OnCall Visit Yes   Ch responded to trauma level II. There was family at bedside. Pt was en route to CT. Ch provided hospitality and support to staff. No follow-up needed at this time.

## 2022-08-24 NOTE — Progress Notes (Signed)
Orthopedic Tech Progress Note Patient Details:  Hannah [name] 08/09/1940 [id number]  Patient ID: Hannah [name], female   DOB: 07-28-1940, 82 y.o.   MRN: [id number] Level 2 trauma, not needed. Al Decant 08/24/2022, 8:32 PM

## 2022-08-24 NOTE — H&P (Incomplete)
History and Physical      Hannah [name] LKG:[id number] DOB: Mar 12, 1940 DOA: 08/24/2022; DOS: 08/24/2022  PCP: [name], MD *** Patient coming from: home ***  I have personally briefly reviewed patient's old medical records in [facility]  Chief Complaint: ***  HPI: Hannah [name] is a 82 y.o. female with medical history significant for *** who is admitted to [facility] on 08/24/2022 with *** after presenting from home*** to [facility] ED complaining of ***.    ***       ***   ED Course:  Vital signs in the ED were notable for the following: ***  Labs were notable for the following: ***  Per my interpretation, EKG in ED demonstrated the following:  ***  Imaging in the ED, per corresponding formal radiology read, was notable for the following:  ***  EDP discussed patient's case with on-call neurosurgery, Dr. Lovell Sheehan, who conveyed that neurosurgery will formally consult and see the patient in the morning.  In the meantime, neurosurgery recommends  repeat CT head, MRI cervical spine, reversal of supratherapeutic INR, and to maintain cervical collar.  While in the ED, the following were administered: ***  Subsequently, the patient was admitted  ***  ***red    Review of Systems: As per HPI otherwise 10 point review of systems negative.   Past Medical History:  Diagnosis Date   Acute massive pulmonary embolism (HCC) 07/13/2012   Massive PE w/ PEA arrest 07/13/12 >TNK >IVC filter >discharged on comadin     Anticoagulated on warfarin    Chronic diastolic CHF (congestive heart failure) (HCC) 03/15/2017   Chronic kidney disease, stage 3a (HCC) 12/15/2018   Diabetes mellitus, type 2 (HCC) 07/15/2012   with peripheral neuropathy   DM2 (diabetes mellitus, type 2) (HCC) 07/23/2012   HFrEF (heart failure with reduced ejection fraction) (HCC) 03/15/2017   Non-ischemic cardiomyopathy // Cardiac catheterization 01/01/21:  RCA prox to mid 20 // Echocardiogram 12/26/20:  EF  30-35, ant septum/apex, mid inf-sept AK; ant-lat/ant/inf HK, Gr 1 DD, mild LVH, normal RVSF, RVSP 27.2, small effusion (ant to RV), mild MR, mod TR, AV sclerosis w/o AS   Hyperlipemia 07/14/2012   Hyperlipidemia 02/25/2013   Hypertension 07/14/2012   Hypothyroid 06/06/2013   Large bowel stricture (HCC)    s/p colectomy in 2016   Neuropathy 02/12/2015   Osteoarthritis    s/p hip and knee replacements   PAF (paroxysmal atrial fibrillation) (HCC)    Pulmonary hypertension (HCC) 07/26/2016   Syncope 10/2018   Thyroid disease     Past Surgical History:  Procedure Laterality Date   COLONOSCOPY N/A 03/12/2016   Procedure: COLONOSCOPY;  Surgeon: Dorena Cookey, MD;  Location: [facility] ENDOSCOPY;  Service: Endoscopy;  Laterality: N/A;   COLOSTOMY N/A 06/03/2014   Procedure: COLOSTOMY;  Surgeon: Harriette Bouillon, MD;  Location: [facility] OR;  Service: General;  Laterality: N/A;   FLEXIBLE SIGMOIDOSCOPY N/A 05/30/2014   Procedure: Arnell Sieving;  Surgeon: Jeani Hawking, MD;  Location: [facility] ENDOSCOPY;  Service: Endoscopy;  Laterality: N/A;   INSERTION OF VENA CAVA FILTER N/A 07/16/2012   Procedure: INSERTION OF VENA CAVA FILTER;  Surgeon: [name], MD;  Location: [facility] CATH LAB;  Service: Cardiovascular;  Laterality: N/A;   KNEE ARTHROSCOPY     LEFT HEART CATH AND CORONARY ANGIOGRAPHY N/A 01/01/2021   Procedure: LEFT HEART CATH AND CORONARY ANGIOGRAPHY;  Surgeon: Kathleene Hazel, MD;  Location: [facility] INVASIVE CV LAB;  Service: Cardiovascular;  Laterality: N/A;   LEFT  HEART CATH AND CORONARY ANGIOGRAPHY N/A 04/29/2022   Procedure: LEFT HEART CATH AND CORONARY ANGIOGRAPHY;  Surgeon: Orbie Pyo, MD;  Location: [facility] INVASIVE CV LAB;  Service: Cardiovascular;  Laterality: N/A;   PARTIAL COLECTOMY N/A 06/03/2014   Procedure: PARTIAL COLECTOMY;  Surgeon: Harriette Bouillon, MD;  Location: [facility] OR;  Service: General;  Laterality: N/A;   REDUCTION MAMMAPLASTY Bilateral    RIGHT HEART CATH N/A 12/30/2020   Procedure:  RIGHT HEART CATH;  Surgeon: Kathleene Hazel, MD;  Location: [facility] INVASIVE CV LAB;  Service: Cardiovascular;  Laterality: N/A;   SP ARTHRO HIP*L*      Social History:  reports that she quit smoking about 54 years ago. Her smoking use included cigarettes. She started smoking about 64 years ago. She has a 10 pack-year smoking history. She has never used smokeless tobacco. She reports that she does not drink alcohol and does not use drugs.   Allergies  Allergen Reactions   Augmentin [Amoxicillin-Pot Clavulanate] Itching and Other (See Comments)    Severe vaginal itching   Tranxene [Clorazepate] Itching   Crestor [Rosuvastatin]     Made her sick on stomach, she is able to tolerate the zocor   Penicillins Itching and Rash    Family History  Problem Relation Age of Onset   Breast cancer Mother     Family history reviewed and not pertinent ***   Prior to Admission medications   Medication Sig Start Date End Date Taking? Authorizing Provider  acetaminophen (TYLENOL) 500 MG tablet Take 1,000 mg by mouth every 6 (six) hours as needed for mild pain.   Yes [provider]  diclofenac Sodium (VOLTAREN ARTHRITIS PAIN) 1 % GEL Apply 2 g topically as needed.   Yes [provider]  furosemide (LASIX) 20 MG tablet Take 1 tablet (20 mg total) by mouth daily as needed for fluid or edema. 03/10/21  Yes Koberlein, Paris Lore, MD  nitroGLYCERIN (NITROSTAT) 0.4 MG SL tablet Place 1 tablet (0.4 mg total) under the tongue every 5 (five) minutes as needed for chest pain. 04/30/22  Yes Willeen Niece, MD  warfarin (COUMADIN) 2.5 MG tablet Take 1 tablet (2.5 mg total) by mouth daily. 06/29/22  Yes Pricilla Riffle, MD  atorvastatin (LIPITOR) 40 MG tablet Take 1 tablet (40 mg total) by mouth daily. 03/03/22   [name], MD  dapagliflozin propanediol (FARXIGA) 10 MG TABS tablet Take 1 tablet (10 mg total) by mouth daily before breakfast. 12/23/21   Pricilla Riffle, MD  DULoxetine (CYMBALTA)  30 MG capsule Take 1 capsule (30 mg total) by mouth daily. 07/25/22   [name], MD  glucose blood [facility] VERIO) test strip USE 1 STRIP TO CHECK GLUCOSE ONCE DAILY AS DIRECTED 05/13/22   [name], MD  metoprolol succinate (TOPROL XL) 25 MG 24 hr tablet Take 1 tablet (25 mg total) by mouth daily. 06/08/22 06/08/23  Sherald Hess, NP  OneTouch Delica Lancets 30G MISC USE 1  TO CHECK GLUCOSE ONCE DAILY 05/13/22   [name], MD  pantoprazole (PROTONIX) 40 MG tablet Take 1 tablet (40 mg total) by mouth daily. 02/26/21   Koberlein, Paris Lore, MD  sacubitril-valsartan (ENTRESTO) 24-26 MG Take 1 tablet by mouth 2 (two) times daily. 12/14/21   [name], MD  spironolactone (ALDACTONE) 25 MG tablet Take 0.5 tablets (12.5 mg total) by mouth daily. 06/27/22   Andrey Farmer, PA-C  vitamin B-12 (CYANOCOBALAMIN) 1000 MCG tablet Take 1 tablet (  1,000 mcg total) by mouth daily. 05/15/20   Wynn Banker, MD  vitamin C (ASCORBIC ACID) 500 MG tablet Take 500 mg by mouth daily.     [provider]     Objective    Physical Exam: Vitals:   08/24/22 2245 08/24/22 2255 08/24/22 2300 08/24/22 2315  BP: 117/62  111/63 123/70  Pulse: 84 89 87 84  Resp:  17 16 15   Temp:      TempSrc:      SpO2: 100% 100% 100% 99%  Weight:      Height:        General: appears to be stated age; alert, oriented Skin: warm, dry, no rash Head:  AT/[place] Mouth:  Oral mucosa membranes appear moist, normal dentition Neck: supple; trachea midline Heart:  RRR; did not appreciate any M/R/G Lungs: CTAB, did not appreciate any wheezes, rales, or rhonchi Abdomen: + BS; soft, ND, NT Vascular: 2+ pedal pulses b/l; 2+ radial pulses b/l Extremities: no peripheral edema, no muscle wasting Neuro: strength and sensation intact in upper and lower extremities b/l ***   *** Neuro: 5/5 strength of the proximal and distal flexors and extensors of the upper and lower extremities bilaterally;  sensation intact in upper and lower extremities b/l; cranial nerves II through XII grossly intact; no pronator drift; no evidence suggestive of slurred speech, dysarthria, or facial droop; Normal muscle tone. No tremors.  *** Neuro: In the setting of the patient's current mental status and associated inability to follow instructions, unable to perform full neurologic exam at this time.  As such, assessment of strength, sensation, and cranial nerves is limited at this time. Patient noted to spontaneously move all 4 extremities. No tremors.  ***    Labs on Admission: I have personally reviewed following labs and imaging studies  CBC: Recent Labs  Lab 08/24/22 2028 08/24/22 2030  WBC 8.8  --   HGB 14.5 16.3*  HCT 46.7* 48.0*  MCV 95.1  --   PLT 156  --    Basic Metabolic Panel: Recent Labs  Lab 08/24/22 2028 08/24/22 2030  NA 134* 134*  K 3.6 3.6  CL 101 102  CO2 15*  --   GLUCOSE 107* 109*  BUN 18 19  CREATININE 1.36* 0.90  CALCIUM 8.7*  --    GFR: Estimated Creatinine Clearance: 55.7 mL/min (by C-G formula based on SCr of 0.9 mg/dL). Liver Function Tests: Recent Labs  Lab 08/24/22 2028  AST 18  ALT 12  ALKPHOS 50  BILITOT 1.4*  PROT 5.9*  ALBUMIN 2.8*   No results for input(s): "LIPASE", "AMYLASE" in the last 168 hours. No results for input(s): "AMMONIA" in the last 168 hours. Coagulation Profile: Recent Labs  Lab 08/19/22 1505 08/24/22 2028  INR 1.5* 3.5*   Cardiac Enzymes: No results for input(s): "CKTOTAL", "CKMB", "CKMBINDEX", "TROPONINI" in the last 168 hours. BNP (last 3 results) No results for input(s): "PROBNP" in the last 8760 hours. HbA1C: No results for input(s): "HGBA1C" in the last 72 hours. CBG: No results for input(s): "GLUCAP" in the last 168 hours. Lipid Profile: No results for input(s): "CHOL", "HDL", "LDLCALC", "TRIG", "CHOLHDL", "LDLDIRECT" in the last 72 hours. Thyroid Function Tests: No results for input(s): "TSH", "T4TOTAL",  "FREET4", "T3FREE", "THYROIDAB" in the last 72 hours. Anemia Panel: No results for input(s): "VITAMINB12", "FOLATE", "FERRITIN", "TIBC", "IRON", "RETICCTPCT" in the last 72 hours. Urine analysis:    Component Value Date/Time   COLORURINE YELLOW 07/22/2022 0003   APPEARANCEUR HAZY (  A) 07/22/2022 0003   LABSPEC 1.015 07/22/2022 0003   PHURINE 5.0 07/22/2022 0003   GLUCOSEU >=500 (A) 07/22/2022 0003   HGBUR MODERATE (A) 07/22/2022 0003   BILIRUBINUR NEGATIVE 07/22/2022 0003   KETONESUR 80 (A) 07/22/2022 0003   PROTEINUR 30 (A) 07/22/2022 0003   UROBILINOGEN 0.2 03/14/2017 1102   NITRITE NEGATIVE 07/22/2022 0003   LEUKOCYTESUR NEGATIVE 07/22/2022 0003    Radiological Exams on Admission: CT HEAD WO CONTRAST  Result Date: 08/24/2022 CLINICAL DATA:  Fall, on blood thinners EXAM: CT HEAD WITHOUT CONTRAST CT CERVICAL SPINE WITHOUT CONTRAST TECHNIQUE: Multidetector CT imaging of the head and cervical spine was performed following the standard protocol without intravenous contrast. Multiplanar CT image reconstructions of the cervical spine were also generated. RADIATION DOSE REDUCTION: This exam was performed according to the departmental dose-optimization program which includes automated exposure control, adjustment of the mA and/or kV according to patient size and/or use of iterative reconstruction technique. COMPARISON:  07/21/2022 CT head and cervical spine FINDINGS: CT HEAD FINDINGS Brain: Suspect trace subarachnoid hemorrhage along the right frontal lobe (series 2, image 22), although evaluation is somewhat limited by beam hardening artifact. No parenchymal hemorrhage. No evidence of acute infarct, mass, mass effect, or midline shift. No hydrocephalus or extra-axial fluid collection. Partial empty sella. Age related cerebral atrophy. Periventricular white matter changes, likely the sequela of chronic small vessel ischemic disease. Vascular: No hyperdense vessel. Skull: Negative for fracture or  focal lesion. Frontal scalp hematoma. Sinuses/Orbits: Small mucous retention cyst in the right maxillary sinus. Otherwise clear paranasal sinuses. No acute finding in the orbits. Other: The mastoid air cells are well aerated. CT CERVICAL SPINE FINDINGS Alignment: No traumatic listhesis. Skull base and vertebrae: Acute fracture through the base of the dens, extending into the posterior body (type 2) (series 8, images 29-33), which is posteriorly displaced approximately 4 mm. Bilateral fractures through the anterior arch of C1 (series 5, image 23), with approximately 4 mm of displacement. The fractures do not appear to involve the vertebral artery foramina. Osteopenia. Soft tissues and spinal canal: Trace prevertebral fluid. Suspect a small amount of hemorrhage along the dorsal aspect of C2, without significant mass effect on the thecal sac. 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 Upper chest: Negative. IMPRESSION: 1. Suspect trace subarachnoid hemorrhage along the right frontal lobe, although evaluation is somewhat limited by beam hardening artifact. 2. Acute fracture through the base of the dens, extending into the posterior body (type 2) which is posteriorly displaced approximately 4 mm. 3. Bilateral fractures through the anterior arch of C1, with approximately 4 mm of displacement. 4. Suspect a small amount of hemorrhage along the dorsal aspect of C2, without significant mass effect on the thecal sac. This could be further evaluated with MRI. These results were called by telephone at the time of interpretation on 08/24/2022 at 9:21 pm to provider DAN FLOYD , who verbally acknowledged these results. Electronically Signed   By: Wiliam Ke M.D.   On: 08/24/2022 21:23   CT CERVICAL SPINE WO CONTRAST  Result Date:  08/24/2022 CLINICAL DATA:  Fall, on blood thinners EXAM: CT HEAD WITHOUT CONTRAST CT CERVICAL SPINE WITHOUT CONTRAST TECHNIQUE: Multidetector CT imaging of the head and cervical spine was performed following the standard protocol without intravenous contrast. Multiplanar CT image reconstructions of the cervical spine were also generated.  RADIATION DOSE REDUCTION: This exam was performed according to the departmental dose-optimization program which includes automated exposure control, adjustment of the mA and/or kV according to patient size and/or use of iterative reconstruction technique. COMPARISON:  07/21/2022 CT head and cervical spine FINDINGS: CT HEAD FINDINGS Brain: Suspect trace subarachnoid hemorrhage along the right frontal lobe (series 2, image 22), although evaluation is somewhat limited by beam hardening artifact. No parenchymal hemorrhage. No evidence of acute infarct, mass, mass effect, or midline shift. No hydrocephalus or extra-axial fluid collection. Partial empty sella. Age related cerebral atrophy. Periventricular white matter changes, likely the sequela of chronic small vessel ischemic disease. Vascular: No hyperdense vessel. Skull: Negative for fracture or focal lesion. Frontal scalp hematoma. Sinuses/Orbits: Small mucous retention cyst in the right maxillary sinus. Otherwise clear paranasal sinuses. No acute finding in the orbits. Other: The mastoid air cells are well aerated. CT CERVICAL SPINE FINDINGS Alignment: No traumatic listhesis. Skull base and vertebrae: Acute fracture through the base of the dens, extending into the posterior body (type 2) (series 8, images 29-33), which is posteriorly displaced approximately 4 mm. Bilateral fractures through the anterior arch of C1 (series 5, image 23), with approximately 4 mm of displacement. The fractures do not appear to involve the vertebral artery foramina. Osteopenia. Soft tissues and spinal canal: Trace prevertebral fluid. Suspect a small  amount of hemorrhage along the dorsal aspect of C2, without significant mass effect on the thecal sac. 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 Upper chest: Negative. IMPRESSION: 1. Suspect trace subarachnoid hemorrhage along the right frontal lobe, although evaluation is somewhat limited by beam hardening artifact. 2. Acute fracture through the base of the dens, extending into the posterior body (type 2) which is posteriorly displaced approximately 4 mm. 3. Bilateral fractures through the anterior arch of C1, with approximately 4 mm of displacement. 4. Suspect a small amount of hemorrhage along the dorsal aspect of C2, without significant mass effect on the thecal sac. This could be further evaluated with MRI. These results were called by telephone at the time of interpretation on 08/24/2022 at 9:21 pm to provider DAN FLOYD , who verbally acknowledged these results. Electronically Signed   By: Wiliam Ke M.D.   On: 08/24/2022 21:23   DG Chest Port 1 View  Result Date: 08/24/2022 CLINICAL DATA:  Trauma EXAM: PORTABLE CHEST 1 VIEW COMPARISON:  CT of the chest 12/15/2018 FINDINGS: There is prominence of the right paratracheal region corresponding to ectatic vasculature, unchanged. Heart size is within normal limits. There is no focal lung infiltrate, pleural effusion or pneumothorax. No acute fractures are seen. IMPRESSION: No active disease. Electronically Signed   By: Darliss Cheney M.D.   On: 08/24/2022 20:55   DG Pelvis Portable  Result Date: 08/24/2022 CLINICAL DATA:  Level 2 trauma on blood thinners. EXAM: PORTABLE PELVIS 1-2 VIEWS COMPARISON:  07/21/2022 FINDINGS: Left THA. No radiographic evidence of loosening. No acute fracture or dislocation. IVC filter. IMPRESSION: No evidence of acute fracture or  dislocation. Electronically Signed   By: Minerva Fester M.D.   On: 08/24/2022 20:55      Assessment/Plan   Principal Problem:   Subarachnoid hematoma (HCC)   ***            ***                ***                 ***               ***               ***               ***                ***               ***               ***               ***               ***              ***          ***  DVT prophylaxis: SCD's ***  Code Status: Full code*** Family Communication: none*** Disposition Plan: Per Rounding Team Consults called: none***;  Admission status: ***     I SPENT GREATER THAN 75 *** MINUTES IN CLINICAL CARE TIME/MEDICAL DECISION-MAKING IN COMPLETING THIS ADMISSION.      Chaney Born Jerlene Rockers DO [facility]  From 7PM - 7AM   08/24/2022, 11:36 PM   ***

## 2022-08-24 NOTE — ED Notes (Signed)
Patient transported to CT 

## 2022-08-25 ENCOUNTER — Inpatient Hospital Stay (HOSPITAL_COMMUNITY): Payer: Medicare Other

## 2022-08-25 ENCOUNTER — Encounter (HOSPITAL_COMMUNITY): Payer: Self-pay | Admitting: Internal Medicine

## 2022-08-25 DIAGNOSIS — R791 Abnormal coagulation profile: Secondary | ICD-10-CM | POA: Diagnosis present

## 2022-08-25 DIAGNOSIS — I5032 Chronic diastolic (congestive) heart failure: Secondary | ICD-10-CM | POA: Diagnosis present

## 2022-08-25 DIAGNOSIS — S12000A Unspecified displaced fracture of first cervical vertebra, initial encounter for closed fracture: Secondary | ICD-10-CM | POA: Diagnosis present

## 2022-08-25 DIAGNOSIS — S066X0A Traumatic subarachnoid hemorrhage without loss of consciousness, initial encounter: Secondary | ICD-10-CM | POA: Diagnosis not present

## 2022-08-25 DIAGNOSIS — W19XXXA Unspecified fall, initial encounter: Secondary | ICD-10-CM

## 2022-08-25 DIAGNOSIS — E8729 Other acidosis: Secondary | ICD-10-CM | POA: Diagnosis present

## 2022-08-25 DIAGNOSIS — Y92009 Unspecified place in unspecified non-institutional (private) residence as the place of occurrence of the external cause: Secondary | ICD-10-CM

## 2022-08-25 DIAGNOSIS — E872 Acidosis, unspecified: Secondary | ICD-10-CM | POA: Diagnosis present

## 2022-08-25 DIAGNOSIS — S12100A Unspecified displaced fracture of second cervical vertebra, initial encounter for closed fracture: Secondary | ICD-10-CM | POA: Diagnosis present

## 2022-08-25 LAB — CBC WITH DIFFERENTIAL/PLATELET
Abs Immature Granulocytes: 0.05 10*3/uL (ref 0.00–0.07)
Basophils Absolute: 0 10*3/uL (ref 0.0–0.1)
Basophils Relative: 0 %
Eosinophils Absolute: 0 10*3/uL (ref 0.0–0.5)
Eosinophils Relative: 0 %
HCT: 46.4 % — ABNORMAL HIGH (ref 36.0–46.0)
Hemoglobin: 14.5 g/dL (ref 12.0–15.0)
Immature Granulocytes: 0 %
Lymphocytes Relative: 12 %
Lymphs Abs: 1.4 10*3/uL (ref 0.7–4.0)
MCH: 29.6 pg (ref 26.0–34.0)
MCHC: 31.3 g/dL (ref 30.0–36.0)
MCV: 94.7 fL (ref 80.0–100.0)
Monocytes Absolute: 0.6 10*3/uL (ref 0.1–1.0)
Monocytes Relative: 5 %
Neutro Abs: 9.9 10*3/uL — ABNORMAL HIGH (ref 1.7–7.7)
Neutrophils Relative %: 83 %
Platelets: 146 10*3/uL — ABNORMAL LOW (ref 150–400)
RBC: 4.9 MIL/uL (ref 3.87–5.11)
RDW: 17.2 % — ABNORMAL HIGH (ref 11.5–15.5)
WBC: 11.9 10*3/uL — ABNORMAL HIGH (ref 4.0–10.5)
nRBC: 0 % (ref 0.0–0.2)

## 2022-08-25 LAB — PROTIME-INR
INR: 1.1 (ref 0.8–1.2)
INR: 1.1 (ref 0.8–1.2)
Prothrombin Time: 13.9 seconds (ref 11.4–15.2)
Prothrombin Time: 14.6 seconds (ref 11.4–15.2)

## 2022-08-25 LAB — GLUCOSE, CAPILLARY
Glucose-Capillary: 121 mg/dL — ABNORMAL HIGH (ref 70–99)
Glucose-Capillary: 146 mg/dL — ABNORMAL HIGH (ref 70–99)

## 2022-08-25 LAB — MAGNESIUM: Magnesium: 1.6 mg/dL — ABNORMAL LOW (ref 1.7–2.4)

## 2022-08-25 LAB — COMPREHENSIVE METABOLIC PANEL
ALT: 14 U/L (ref 0–44)
AST: 18 U/L (ref 15–41)
Albumin: 2.8 g/dL — ABNORMAL LOW (ref 3.5–5.0)
Alkaline Phosphatase: 48 U/L (ref 38–126)
Anion gap: 15 (ref 5–15)
BUN: 17 mg/dL (ref 8–23)
CO2: 17 mmol/L — ABNORMAL LOW (ref 22–32)
Calcium: 8.2 mg/dL — ABNORMAL LOW (ref 8.9–10.3)
Chloride: 103 mmol/L (ref 98–111)
Creatinine, Ser: 1.12 mg/dL — ABNORMAL HIGH (ref 0.44–1.00)
GFR, Estimated: 49 mL/min — ABNORMAL LOW (ref >60)
Glucose, Bld: 119 mg/dL — ABNORMAL HIGH (ref 70–99)
Potassium: 3.9 mmol/L (ref 3.5–5.1)
Sodium: 135 mmol/L (ref 135–145)
Total Bilirubin: 1.8 mg/dL — ABNORMAL HIGH (ref 0.3–1.2)
Total Protein: 5.6 g/dL — ABNORMAL LOW (ref 6.5–8.1)

## 2022-08-25 LAB — CBG MONITORING, ED
Glucose-Capillary: 85 mg/dL (ref 70–99)
Glucose-Capillary: 86 mg/dL (ref 70–99)

## 2022-08-25 LAB — HEMOGLOBIN A1C
Hgb A1c MFr Bld: 6 % — ABNORMAL HIGH (ref 4.8–5.6)
Mean Plasma Glucose: 125.5 mg/dL

## 2022-08-25 LAB — TSH: TSH: 1.993 u[IU]/mL (ref 0.350–4.500)

## 2022-08-25 LAB — LACTIC ACID, PLASMA: Lactic Acid, Venous: 1.4 mmol/L (ref 0.5–1.9)

## 2022-08-25 MED ORDER — SACUBITRIL-VALSARTAN 24-26 MG PO TABS
1.0000 | ORAL_TABLET | Freq: Two times a day (BID) | ORAL | Status: DC
  Administered 2022-08-25: 1 via ORAL
  Filled 2022-08-25: qty 1

## 2022-08-25 MED ORDER — LEVOTHYROXINE SODIUM 25 MCG PO TABS
25.0000 ug | ORAL_TABLET | Freq: Every day | ORAL | Status: DC
  Administered 2022-08-26 – 2022-08-28 (×2): 25 ug via ORAL
  Filled 2022-08-25 (×3): qty 1

## 2022-08-25 MED ORDER — DULOXETINE HCL 30 MG PO CPEP
30.0000 mg | ORAL_CAPSULE | Freq: Every day | ORAL | Status: DC
  Administered 2022-08-25 – 2022-08-28 (×3): 30 mg via ORAL
  Filled 2022-08-25 (×4): qty 1

## 2022-08-25 MED ORDER — SODIUM CHLORIDE 0.9 % IV SOLN: INTRAVENOUS | Status: DC

## 2022-08-25 MED ORDER — MAGNESIUM SULFATE 2 GM/50ML IV SOLN
2.0000 g | Freq: Once | INTRAVENOUS | Status: AC
  Administered 2022-08-25: 2 g via INTRAVENOUS
  Filled 2022-08-25: qty 50

## 2022-08-25 MED ORDER — PANTOPRAZOLE SODIUM 40 MG PO TBEC
40.0000 mg | DELAYED_RELEASE_TABLET | Freq: Every day | ORAL | Status: DC
  Administered 2022-08-25 – 2022-08-26 (×2): 40 mg via ORAL
  Filled 2022-08-25 (×2): qty 1

## 2022-08-25 MED ORDER — SPIRONOLACTONE 12.5 MG HALF TABLET
12.5000 mg | ORAL_TABLET | Freq: Every day | ORAL | Status: DC
  Administered 2022-08-25: 12.5 mg via ORAL
  Filled 2022-08-25: qty 1

## 2022-08-25 MED ORDER — INSULIN ASPART 100 UNIT/ML IJ SOLN: 0.0000 [IU] | Freq: Three times a day (TID) | INTRAMUSCULAR | Status: DC

## 2022-08-25 MED ORDER — HYDROMORPHONE HCL 1 MG/ML IJ SOLN
0.5000 mg | INTRAMUSCULAR | Status: DC | PRN
  Administered 2022-08-25 – 2022-08-28 (×6): 0.5 mg via INTRAVENOUS
  Filled 2022-08-25 (×5): qty 0.5
  Filled 2022-08-25: qty 1

## 2022-08-25 MED ORDER — NITROGLYCERIN 0.4 MG SL SUBL: 0.4000 mg | SUBLINGUAL_TABLET | SUBLINGUAL | Status: DC | PRN

## 2022-08-25 MED ORDER — ATORVASTATIN CALCIUM 40 MG PO TABS
40.0000 mg | ORAL_TABLET | Freq: Every day | ORAL | Status: DC
  Administered 2022-08-25 – 2022-08-28 (×3): 40 mg via ORAL
  Filled 2022-08-25 (×3): qty 1

## 2022-08-25 MED ORDER — METOPROLOL SUCCINATE ER 25 MG PO TB24
25.0000 mg | ORAL_TABLET | Freq: Every day | ORAL | Status: DC
  Administered 2022-08-25: 25 mg via ORAL
  Filled 2022-08-25: qty 1

## 2022-08-25 NOTE — Consult Note (Signed)
Reason for Consult: Cervical fracture, central cord syndrome Referring Physician: Dr. Alroy Dust Hannah [name] is an 82 y.o. female.  HPI: The patient is an 82 year old white female who was on Coumadin was taken multiple falls.  She fell yesterday and was brought to [facility].  She had weakness and neck pain.  She had a head CT which demonstrated brain atrophy and a cervical CT which demonstrated a type II odontoid fracture.  She got a cervical MRI which demonstrated evidence of spinal cord injury.  She was admitted by Dr. Jacqulyn Bath.  A neurosurgical consultation was requested.  Presently the patient is alert and pleasant.  She complains of neck pain.  She has weakness in her hands primarily.  She denies numbness and tingling.    Past Medical History:  Diagnosis Date   Acute massive pulmonary embolism (HCC) 07/13/2012   Massive PE w/ PEA arrest 07/13/12 >TNK >IVC filter >discharged on comadin     Anticoagulated on warfarin    Chronic diastolic CHF (congestive heart failure) (HCC) 03/15/2017   Chronic kidney disease, stage 3a (HCC) 12/15/2018   Diabetes mellitus, type 2 (HCC) 07/15/2012   with peripheral neuropathy   DM2 (diabetes mellitus, type 2) (HCC) 07/23/2012   HFrEF (heart failure with reduced ejection fraction) (HCC) 03/15/2017   Non-ischemic cardiomyopathy // Cardiac catheterization 01/01/21:  RCA prox to mid 20 // Echocardiogram 12/26/20:  EF 30-35, ant septum/apex, mid inf-sept AK; ant-lat/ant/inf HK, Gr 1 DD, mild LVH, normal RVSF, RVSP 27.2, small effusion (ant to RV), mild MR, mod TR, AV sclerosis w/o AS   Hyperlipemia 07/14/2012   Hyperlipidemia 02/25/2013   Hypertension 07/14/2012   Hypothyroid 06/06/2013   Large bowel stricture (HCC)    s/p colectomy in 2016   Neuropathy 02/12/2015   Osteoarthritis    s/p hip and knee replacements   PAF (paroxysmal atrial fibrillation) (HCC)    Pulmonary hypertension (HCC) 07/26/2016   Syncope 10/2018   Thyroid disease     Past Surgical  History:  Procedure Laterality Date   COLONOSCOPY N/A 03/12/2016   Procedure: COLONOSCOPY;  Surgeon: Dorena Cookey, MD;  Location: [facility] ENDOSCOPY;  Service: Endoscopy;  Laterality: N/A;   COLOSTOMY N/A 06/03/2014   Procedure: COLOSTOMY;  Surgeon: Harriette Bouillon, MD;  Location: [facility] OR;  Service: General;  Laterality: N/A;   FLEXIBLE SIGMOIDOSCOPY N/A 05/30/2014   Procedure: Arnell Sieving;  Surgeon: Jeani Hawking, MD;  Location: [facility] ENDOSCOPY;  Service: Endoscopy;  Laterality: N/A;   INSERTION OF VENA CAVA FILTER N/A 07/16/2012   Procedure: INSERTION OF VENA CAVA FILTER;  Surgeon: [name], MD;  Location: [facility] CATH LAB;  Service: Cardiovascular;  Laterality: N/A;   KNEE ARTHROSCOPY     LEFT HEART CATH AND CORONARY ANGIOGRAPHY N/A 01/01/2021   Procedure: LEFT HEART CATH AND CORONARY ANGIOGRAPHY;  Surgeon: Kathleene Hazel, MD;  Location: [facility] INVASIVE CV LAB;  Service: Cardiovascular;  Laterality: N/A;   LEFT HEART CATH AND CORONARY ANGIOGRAPHY N/A 04/29/2022   Procedure: LEFT HEART CATH AND CORONARY ANGIOGRAPHY;  Surgeon: Orbie Pyo, MD;  Location: [facility] INVASIVE CV LAB;  Service: Cardiovascular;  Laterality: N/A;   PARTIAL COLECTOMY N/A 06/03/2014   Procedure: PARTIAL COLECTOMY;  Surgeon: Harriette Bouillon, MD;  Location: [facility] OR;  Service: General;  Laterality: N/A;   REDUCTION MAMMAPLASTY Bilateral    RIGHT HEART CATH N/A 12/30/2020   Procedure: RIGHT HEART CATH;  Surgeon: Kathleene Hazel, MD;  Location: [facility] INVASIVE CV LAB;  Service: Cardiovascular;  Laterality: N/A;  SP ARTHRO HIP*L*      Family History  Problem Relation Age of Onset   Breast cancer Mother     Social History:  reports that she quit smoking about 54 years ago. Her smoking use included cigarettes. She started smoking about 64 years ago. She has a 10 pack-year smoking history. She has never used smokeless tobacco. She reports that she does not drink alcohol and does not use drugs.  Allergies:  Allergies   Allergen Reactions   Augmentin [Amoxicillin-Pot Clavulanate] Itching and Other (See Comments)    Severe vaginal itching   Tranxene [Clorazepate] Itching   Crestor [Rosuvastatin]     Made her sick on stomach, she is able to tolerate the zocor   Penicillins Itching and Rash    Medications: I have reviewed the patient's current medications. Prior to Admission: (Not in a hospital admission)  Scheduled:  atorvastatin  40 mg Oral Daily   DULoxetine  30 mg Oral Daily   insulin aspart  0-6 Units Subcutaneous TID WC   sacubitril-valsartan  1 tablet Oral BID   Continuous: [id number] **OR** acetaminophen, hydrALAZINE, melatonin, ondansetron (ZOFRAN) IV Anti-infectives (From admission, onward)    None        Results for orders placed or performed during the hospital encounter of 08/24/22 (from the past 48 hour(s))  Comprehensive metabolic panel     Status: Abnormal   Collection Time: 08/24/22  8:28 PM  Result Value Ref Range   Sodium 134 (L) 135 - 145 mmol/L   Potassium 3.6 3.5 - 5.1 mmol/L   Chloride 101 98 - 111 mmol/L   CO2 15 (L) 22 - 32 mmol/L   Glucose, Bld 107 (H) 70 - 99 mg/dL    Comment: Glucose reference range applies only to samples taken after fasting for at least 8 hours.   BUN 18 8 - 23 mg/dL   Creatinine, Ser 3.24 (H) 0.44 - 1.00 mg/dL   Calcium 8.7 (L) 8.9 - 10.3 mg/dL   Total Protein 5.9 (L) 6.5 - 8.1 g/dL   Albumin 2.8 (L) 3.5 - 5.0 g/dL   AST 18 15 - 41 U/L   ALT 12 0 - 44 U/L   Alkaline Phosphatase 50 38 - 126 U/L   Total Bilirubin 1.4 (H) 0.3 - 1.2 mg/dL   GFR, Estimated 39 (L) >60 mL/min    Comment: (NOTE) Calculated using the CKD-EPI Creatinine Equation (2021)    Anion gap 18 (H) 5 - 15    Comment: Performed at [facility] Lab, [address], [place], [place] [address]  CBC     Status: Abnormal   Collection Time: 08/24/22  8:28 PM  Result Value Ref Range   WBC 8.8 4.0 - 10.5 K/uL   RBC 4.91 3.87 - 5.11 MIL/uL   Hemoglobin 14.5 12.0 -  15.0 g/dL   HCT 72.5 (H) 36.6 - 44.0 %   MCV 95.1 80.0 - 100.0 fL   MCH 29.5 26.0 - 34.0 pg   MCHC 31.0 30.0 - 36.0 g/dL   RDW 34.7 (H) 42.5 - 95.6 %   Platelets 156 150 - 400 K/uL   nRBC 0.0 0.0 - 0.2 %    Comment: Performed at [facility] Lab, [address] [place], [place] [address]  Ethanol     Status: None   Collection Time: 08/24/22  8:28 PM  Result Value Ref Range   Alcohol, Ethyl (B) <10 <10 mg/dL    Comment: (NOTE) Lowest detectable limit for  serum alcohol is 10 mg/dL.  For medical purposes only. Performed at [facility], [address], [place], [place] [address]   Protime-INR     Status: Abnormal   Collection Time: 08/24/22  8:28 PM  Result Value Ref Range   Prothrombin Time 35.4 (H) 11.4 - 15.2 seconds   INR 3.5 (H) 0.8 - 1.2    Comment: (NOTE) INR goal varies based on device and disease states. Performed at [facility], [address], [place], [place] [address]   Sample to Blood Bank     Status: None   Collection Time: 08/24/22  8:28 PM  Result Value Ref Range   Blood Bank Specimen SAMPLE AVAILABLE FOR TESTING    Sample Expiration      08/27/2022,2359 Performed at [facility], [address] [place], [place] [address]   I-Stat Chem 8, ED     Status: Abnormal   Collection Time: 08/24/22  8:30 PM  Result Value Ref Range   Sodium 134 (L) 135 - 145 mmol/L   Potassium 3.6 3.5 - 5.1 mmol/L   Chloride 102 98 - 111 mmol/L   BUN 19 8 - 23 mg/dL   Creatinine, Ser 2.95 0.44 - 1.00 mg/dL   Glucose, Bld 188 (H) 70 - 99 mg/dL    Comment: Glucose reference range applies only to samples taken after fasting for at least 8 hours.   Calcium, Ion 1.09 (L) 1.15 - 1.40 mmol/L   TCO2 18 (L) 22 - 32 mmol/L   Hemoglobin 16.3 (H) 12.0 - 15.0 g/dL   HCT 41.6 (H) 60.6 - 30.1 %  I-Stat Lactic Acid, ED     Status: Abnormal   Collection Time: 08/24/22  8:31 PM  Result Value Ref Range   Lactic Acid, Venous 2.2 (HH) 0.5 - 1.9 mmol/L   Comment NOTIFIED  PHYSICIAN   CBC with Differential/Platelet     Status: Abnormal   Collection Time: 08/25/22 12:12 AM  Result Value Ref Range   WBC 11.9 (H) 4.0 - 10.5 K/uL   RBC 4.90 3.87 - 5.11 MIL/uL   Hemoglobin 14.5 12.0 - 15.0 g/dL   HCT 60.1 (H) 09.3 - 23.5 %   MCV 94.7 80.0 - 100.0 fL   MCH 29.6 26.0 - 34.0 pg   MCHC 31.3 30.0 - 36.0 g/dL   RDW 57.3 (H) 22.0 - 25.4 %   Platelets 146 (L) 150 - 400 K/uL   nRBC 0.0 0.0 - 0.2 %   Neutrophils Relative % 83 %   Neutro Abs 9.9 (H) 1.7 - 7.7 K/uL   Lymphocytes Relative 12 %   Lymphs Abs 1.4 0.7 - 4.0 K/uL   Monocytes Relative 5 %   Monocytes Absolute 0.6 0.1 - 1.0 K/uL   Eosinophils Relative 0 %   Eosinophils Absolute 0.0 0.0 - 0.5 K/uL   Basophils Relative 0 %   Basophils Absolute 0.0 0.0 - 0.1 K/uL   Immature Granulocytes 0 %   Abs Immature Granulocytes 0.05 0.00 - 0.07 K/uL    Comment: Performed at [facility], [address] [place], [place] [address]  Comprehensive metabolic panel     Status: Abnormal   Collection Time: 08/25/22 12:12 AM  Result Value Ref Range   Sodium 135 135 - 145 mmol/L   Potassium 3.9 3.5 - 5.1 mmol/L   Chloride 103 98 - 111 mmol/L   CO2 17 (L) 22 - 32 mmol/L   Glucose, Bld 119 (H)  70 - 99 mg/dL    Comment: Glucose reference range applies only to samples taken after fasting for at least 8 hours.   BUN 17 8 - 23 mg/dL   Creatinine, Ser 1.61 (H) 0.44 - 1.00 mg/dL   Calcium 8.2 (L) 8.9 - 10.3 mg/dL   Total Protein 5.6 (L) 6.5 - 8.1 g/dL   Albumin 2.8 (L) 3.5 - 5.0 g/dL   AST 18 15 - 41 U/L   ALT 14 0 - 44 U/L   Alkaline Phosphatase 48 38 - 126 U/L   Total Bilirubin 1.8 (H) 0.3 - 1.2 mg/dL   GFR, Estimated 49 (L) >60 mL/min    Comment: (NOTE) Calculated using the CKD-EPI Creatinine Equation (2021)    Anion gap 15 5 - 15    Comment: Performed at [facility], [address], [place], [place] [address]  Magnesium     Status: Abnormal   Collection Time: 08/25/22 12:12 AM  Result Value Ref Range    Magnesium 1.6 (L) 1.7 - 2.4 mg/dL    Comment: Performed at [facility], [address] [place], [place] [address]  Protime-INR     Status: None   Collection Time: 08/25/22 12:12 AM  Result Value Ref Range   Prothrombin Time 14.6 11.4 - 15.2 seconds   INR 1.1 0.8 - 1.2    Comment: (NOTE) INR goal varies based on device and disease states. Performed at [facility], [address], [place], [place] [address]   TSH     Status: None   Collection Time: 08/25/22 12:12 AM  Result Value Ref Range   TSH 1.993 0.350 - 4.500 uIU/mL    Comment: Performed by a 3rd Generation assay with a functional sensitivity of <=0.01 uIU/mL. Performed at [facility], [address] [place], [place] [address]   Lactic acid, plasma     Status: None   Collection Time: 08/25/22  5:07 AM  Result Value Ref Range   Lactic Acid, Venous 1.4 0.5 - 1.9 mmol/L    Comment: Performed at [facility], [address] [place], [place] [address]  Protime-INR     Status: None   Collection Time: 08/25/22  5:07 AM  Result Value Ref Range   Prothrombin Time 13.9 11.4 - 15.2 seconds   INR 1.1 0.8 - 1.2    Comment: (NOTE) INR goal varies based on device and disease states. Performed at [facility], [address] [place], [place] [address]   CBG monitoring, ED     Status: None   Collection Time: 08/25/22  7:41 AM  Result Value Ref Range   Glucose-Capillary 85 70 - 99 mg/dL    Comment: Glucose reference range applies only to samples taken after fasting for at least 8 hours.    MR Cervical Spine Wo Contrast  Addendum Date: 08/25/2022   ADDENDUM REPORT: 08/25/2022 05:23 ADDENDUM: Study discussed by telephone with Hospitalist Dr. Arlean Hopping on 08/25/2022 at 05:22. He advised that Neurosurgery is already engaged and he will make them aware of this report. Electronically Signed   By: Odessa Fleming M.D.   On: 08/25/2022 05:23   Result Date: 08/25/2022 CLINICAL DATA:  82 year old female status post  fall on blood thinners, bilateral C1 ring and type 2 odontoid fracture with posterior displacement. Ossification of the posterior longitudinal ligament (OPLL). EXAM: MRI CERVICAL SPINE WITHOUT CONTRAST TECHNIQUE: Multiplanar, multisequence MR imaging of the cervical spine was performed. No intravenous  contrast was administered. COMPARISON:  Cervical spine CT yesterday FINDINGS: Alignment: Stable with straightening of cervical lordosis. Fracture alignment appears stable. Craniocervical alignment of the occipital condyles and C1 lateral masses appears maintained. Vertebrae: Marrow edema at the C1 and odontoid corresponding to known fractures, and curvilinear type 2 odontoid fracture plain is visible on sagittal T1 and T2 (series 5, image 8). Fracture alignment appears stable. Normal background bone marrow signal. Widespread and somewhat bulky vertebral endplate spurring throughout. Cord: Abnormal. Diffuse mass effect on the cervical spinal cord appears in large part related to bulky degenerative disease and Ossification of the posterior longitudinal ligament (OPLL). But heterogeneous abnormal T2 spinal cord signal at the C4 and C5 levels is indeterminate for edema versus cord contusion versus myelomalacia. The abnormal cord signal continues through the C5-C6 disc level. Above the C3-C4 disc, a small posterior column area of T2 hyperintensity on series 8, image 18 is also noted. On axial T2 * imaging areas of susceptibility seemingly in the canal (such as series 10 image 21 on the left at C3-C4) appear to correspond to exophytic endplate or PLL ossification by CT. No convincing spinal cord hemorrhage. Posterior Fossa, vertebral arteries, paraspinal tissues: Abnormal prevertebral fluid or edema from C1 through C6 suspicious for superimposed anterior longitudinal ligament injury. No inter spinous ligament signal abnormality. Tectorial membrane appears intact. Cervicomedullary junction is within normal limits. Grossly  negative visible posterior fossa. Preserved major vascular flow voids in the neck. Partially retropharyngeal course of the carotids. Tortuous and codominant appearing cervical vertebral arteries. Negative lung apices. Disc levels: C1-C2: Acute fractures, better detailed on CT. C2-C3: Disc, endplate and posterior element degeneration with no spinal stenosis. C3-C4: Severe disc space loss with bulky circumferential disc osteophyte complex and C4 OPLL. Moderate to severe spinal stenosis, spinal cord mass effect, and abnormal cord signal. Moderate to severe bilateral foraminal stenosis. C4-C5: Severe disc space loss with bulky disc osteophyte complex, C4 and C5 OPLL. Moderate to severe spinal stenosis and spinal cord mass effect plus cord signal abnormality. Moderate to severe bilateral foraminal stenosis. C5-C6: Similar bulky disc osteophyte complex and OPLL. Moderate spinal stenosis and spinal cord mass effect. Moderate to severe bilateral foraminal stenosis. C6-C7: Bulky disc osteophyte complex. C6 OPLL. Mild spinal stenosis. Spinal cord mass effect and signal abnormality abate at this level. Mild to moderate bilateral foraminal stenosis. C7-T1:  Negative disc.  Mild facet hypertrophy. No visible upper thoracic stenosis. IMPRESSION: 1. Acute C1 and type 2 odontoid fractures superimposed on bulky cervical spine degeneration and Ossification of the posterior longitudinal ligament (OPLL). Fracture alignment appears stable from the CT yesterday. Evidence of associated anterior longitudinal ligament injury. No posterior ligamentous injury identified. 2. Subsequent multifactorial moderate and severe spinal stenosis, cord compression, and spinal cord signal abnormality C3 through C6. The spinal cord abnormality is indeterminate for posttraumatic cord contusion (nonhemorrhagic) versus cord edema versus myelomalacia. 3. Associated moderate to severe cervical foraminal stenosis from C3 through C6. Electronically Signed: By: Odessa Fleming M.D. On: 08/25/2022 05:06   CT Head Wo Contrast  Result Date: 08/25/2022 CLINICAL DATA:  Subarachnoid hemorrhage. EXAM: CT HEAD WITHOUT CONTRAST TECHNIQUE: Contiguous axial images were obtained from the base of the skull through the vertex without intravenous contrast. RADIATION DOSE REDUCTION: This exam was performed according to the departmental dose-optimization program which includes automated exposure control, adjustment of the mA and/or kV according to patient size and/or use of iterative reconstruction technique. COMPARISON:  08/24/2022 FINDINGS: Brain: Previously noted area of possible subarachnoid hemorrhage along  the right frontal lobe is no longer seen; this was most likely related to beam hardening artifact on the prior exam. No evidence of acute infarction, hemorrhage, mass, mass effect, or midline shift. No hydrocephalus or extra-axial fluid collection. Vascular: No hyperdense vessel. Skull: Negative for fracture or focal lesion. Redemonstrated frontal scalp hematoma, with increased right greater than left periorbital component. Sinuses/Orbits: Mucous retention cyst in the right maxillary sinus. Otherwise clear paranasal sinuses. No acute finding in the orbits. Other: The mastoid air cells are well aerated. IMPRESSION: 1. Previously noted area of possible subarachnoid hemorrhage along the right frontal lobe is no longer seen, most likely related to beam hardening artifact on the prior exam. No additional acute intracranial process. 2. Redemonstrated frontal scalp hematoma, with increased right greater than left periorbital component. Electronically Signed   By: Wiliam Ke M.D.   On: 08/25/2022 03:23   CT HEAD WO CONTRAST  Result Date: 08/24/2022 CLINICAL DATA:  Fall, on blood thinners EXAM: CT HEAD WITHOUT CONTRAST CT CERVICAL SPINE WITHOUT CONTRAST TECHNIQUE: Multidetector CT imaging of the head and cervical spine was performed following the standard protocol without intravenous  contrast. Multiplanar CT image reconstructions of the cervical spine were also generated. RADIATION DOSE REDUCTION: This exam was performed according to the departmental dose-optimization program which includes automated exposure control, adjustment of the mA and/or kV according to patient size and/or use of iterative reconstruction technique. COMPARISON:  07/21/2022 CT head and cervical spine FINDINGS: CT HEAD FINDINGS Brain: Suspect trace subarachnoid hemorrhage along the right frontal lobe (series 2, image 22), although evaluation is somewhat limited by beam hardening artifact. No parenchymal hemorrhage. No evidence of acute infarct, mass, mass effect, or midline shift. No hydrocephalus or extra-axial fluid collection. Partial empty sella. Age related cerebral atrophy. Periventricular white matter changes, likely the sequela of chronic small vessel ischemic disease. Vascular: No hyperdense vessel. Skull: Negative for fracture or focal lesion. Frontal scalp hematoma. Sinuses/Orbits: Small mucous retention cyst in the right maxillary sinus. Otherwise clear paranasal sinuses. No acute finding in the orbits. Other: The mastoid air cells are well aerated. CT CERVICAL SPINE FINDINGS Alignment: No traumatic listhesis. Skull base and vertebrae: Acute fracture through the base of the dens, extending into the posterior body (type 2) (series 8, images 29-33), which is posteriorly displaced approximately 4 mm. Bilateral fractures through the anterior arch of C1 (series 5, image 23), with approximately 4 mm of displacement. The fractures do not appear to involve the vertebral artery foramina. Osteopenia. Soft tissues and spinal canal: Trace prevertebral fluid. Suspect a small amount of hemorrhage along the dorsal aspect of C2, without significant mass effect on the thecal sac. Disc levels: Degenerative changes in the cervical spine, with unchanged congenitally short pedicles and ossification of the posterior longitudinal  ligament at C4, C5, and C6, with moderate to severe spinal canal stenosis at these levels. Upper chest: Negative. IMPRESSION: 1. Suspect trace subarachnoid hemorrhage along the right frontal lobe, although evaluation is somewhat limited by beam hardening artifact. 2. Acute fracture through the base of the dens, extending into the posterior body (type 2) which is posteriorly displaced approximately 4 mm. 3. Bilateral fractures through the anterior arch of C1, with approximately 4 mm of displacement. 4. Suspect a small amount of hemorrhage along the dorsal aspect of C2, without significant mass effect on the thecal sac. This could be further evaluated with MRI. These results were called by telephone at the time of interpretation on 08/24/2022 at 9:21 pm to provider DAN FLOYD ,  who verbally acknowledged these results. Electronically Signed   By: Wiliam Ke M.D.   On: 08/24/2022 21:23   CT CERVICAL SPINE WO CONTRAST  Result Date: 08/24/2022 CLINICAL DATA:  Fall, on blood thinners EXAM: CT HEAD WITHOUT CONTRAST CT CERVICAL SPINE WITHOUT CONTRAST TECHNIQUE: Multidetector CT imaging of the head and cervical spine was performed following the standard protocol without intravenous contrast. Multiplanar CT image reconstructions of the cervical spine were also generated. RADIATION DOSE REDUCTION: This exam was performed according to the departmental dose-optimization program which includes automated exposure control, adjustment of the mA and/or kV according to patient size and/or use of iterative reconstruction technique. COMPARISON:  07/21/2022 CT head and cervical spine FINDINGS: CT HEAD FINDINGS Brain: Suspect trace subarachnoid hemorrhage along the right frontal lobe (series 2, image 22), although evaluation is somewhat limited by beam hardening artifact. No parenchymal hemorrhage. No evidence of acute infarct, mass, mass effect, or midline shift. No hydrocephalus or extra-axial fluid collection. Partial empty sella.  Age related cerebral atrophy. Periventricular white matter changes, likely the sequela of chronic small vessel ischemic disease. Vascular: No hyperdense vessel. Skull: Negative for fracture or focal lesion. Frontal scalp hematoma. Sinuses/Orbits: Small mucous retention cyst in the right maxillary sinus. Otherwise clear paranasal sinuses. No acute finding in the orbits. Other: The mastoid air cells are well aerated. CT CERVICAL SPINE FINDINGS Alignment: No traumatic listhesis. Skull base and vertebrae: Acute fracture through the base of the dens, extending into the posterior body (type 2) (series 8, images 29-33), which is posteriorly displaced approximately 4 mm. Bilateral fractures through the anterior arch of C1 (series 5, image 23), with approximately 4 mm of displacement. The fractures do not appear to involve the vertebral artery foramina. Osteopenia. Soft tissues and spinal canal: Trace prevertebral fluid. Suspect a small amount of hemorrhage along the dorsal aspect of C2, without significant mass effect on the thecal sac. 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 Upper chest: Negative. IMPRESSION: 1. Suspect trace subarachnoid hemorrhage along the right frontal lobe, although evaluation is somewhat limited by beam hardening artifact. 2. Acute fracture through the base of the dens, extending into the posterior body (type 2) which is posteriorly displaced approximately 4 mm. 3. Bilateral fractures through the anterior arch of C1, with approximately 4 mm of displacement. 4. Suspect a small amount of hemorrhage along the dorsal aspect of C2, without significant mass effect on the thecal sac. This could be further evaluated with MRI. These results were called by telephone at the time of interpretation on 08/24/2022 at 9:21  pm to provider DAN FLOYD , who verbally acknowledged these results. Electronically Signed   By: Wiliam Ke M.D.   On: 08/24/2022 21:23   DG Chest Port 1 View  Result Date: 08/24/2022 CLINICAL DATA:  Trauma EXAM: PORTABLE CHEST 1 VIEW COMPARISON:  CT of the chest 12/15/2018 FINDINGS: There is prominence of the right paratracheal region corresponding to ectatic vasculature, unchanged. Heart size is within normal limits. There is no focal lung infiltrate, pleural effusion or pneumothorax. No acute fractures are seen. IMPRESSION: No active disease. Electronically Signed   By: Darliss Cheney M.D.   On: 08/24/2022 20:55   DG Pelvis Portable  Result Date: 08/24/2022 CLINICAL DATA:  Level 2 trauma on blood thinners. EXAM: PORTABLE PELVIS 1-2 VIEWS COMPARISON:  07/21/2022 FINDINGS: Left THA. No  radiographic evidence of loosening. No acute fracture or dislocation. IVC filter. IMPRESSION: No evidence of acute fracture or dislocation. Electronically Signed   By: Minerva Fester M.D.   On: 08/24/2022 20:55    ROS: As above Blood pressure (!) 142/81, pulse 75, temperature 97.6 F (36.4 C), temperature source Oral, resp. rate 13, height 5\' 7"  (1.702 m), weight 87.5 kg, SpO2 99%. Estimated body mass index is 30.23 kg/m as calculated from the following:   Height as of this encounter: 5\' 7"  (1.702 m).   Weight as of this encounter: 87.5 kg.  Physical Exam  General: An alert, obese, and pleasant 82 year old black female in a hard collar.  HEENT: The patient has a large swelling on her forehead.  Her pupils are equal round reactive light, extraocular muscles are intact.  Neck: She has a hard collar in place  Thorax: Symmetric  Abdomen: Obese and soft  Extremities: Unremarkable  Neurologic exam: The patient is alert and oriented x 3.  Cranial nerves II through XII are grossly intact bilaterally.  The patient's motor strength is 4+ or 5 in all tested muscle groups except her handgrips are 0/5  bilaterally.  Sensory function is grossly intact to light touch sensation in all tested dermatomes bilaterally.  Imaging studies: I reviewed the patient's head CT performed yesterday and follow-up head CT performed this morning.  It demonstrates diffuse brain atrophy.  I reviewed the patient's cervical CT performed yesterday.  She has a type II odontoid fracture with mild posterior displacement.  She has evidence of ossification of the posterior longitudinal ligaments from C3-4 to C5-6 with spinal stenosis.  She has a C1 ring fracture as well.  I reviewed the patient's cervical MRI performed this morning: It demonstrates severe spinal stenosis at C3-4, C4-5 and C5-6 with ossification of posterior longitudinal ligament.  There is evidence of spinal cord edema.  And also demonstrates her odontoid fracture.  Assessment/Plan: C1 and C2 fracture, ossification of posterior longitudinal ligament, cervical spondylosis, cervical spinal cord injury, central cord syndrome: I have discussed the situation with the patient.  I told her she has a fracture neck and has injured her spinal cord.  It is unlikely that her type II odontoid fracture will heal without surgery.  She is not a great surgical candidate at her age and with all her medical problems.  Ultimately she might need a posterior cervical decompression, instrumentation and fusion from C1-C6.  This does not need to be done acutely.  I would suggest having PT and OT see the patient and perhaps initiate rehabilitation.  We can do her surgery in the next week or 2 if she desires and is an appropriate candidate for surgery.  In the meantime she will need to continue her hard cervical collar.  Hannah [name] 08/25/2022, 7:59 AM

## 2022-08-25 NOTE — Progress Notes (Addendum)
PROGRESS NOTE    Hannah [name]  ONG:[id number] DOB: May 06, 1940 DOA: 08/24/2022 PCP: [name], MD   Brief Narrative:  Hannah [name] is a 82 y.o. female with medical history significant for paroxysmal atrial fibrillation for which she is chronically anticoagulated on warfarin, chronic diastolic heart failure, CKD 3A,  type 2 diabetes mellitus, who is admitted to [facility] on 08/24/2022 with subarachnoid hemorrhage after presenting from home to [facility] ED complaining of fall.   Assessment & Plan:   Principal Problem:   Subarachnoid hematoma (HCC) Active Problems:   DM2 (diabetes mellitus, type 2) (HCC)   Hyperlipidemia   Acquired hypothyroidism   CKD stage 3a, GFR 45-59 ml/min (HCC)   Paroxysmal atrial fibrillation (HCC)   C1 cervical fracture (HCC)   C2 cervical fracture (HCC)   Supratherapeutic INR   Fall at home, initial encounter   High anion gap metabolic acidosis   Lactic acidosis   Chronic diastolic CHF (congestive heart failure) (HCC)   Subarachnoid hemorrhage: Initial CT head was suspecting subarachnoid hemorrhage along right frontal lobe however repeat CT head ruled out subarachnoid hemorrhage.  Patient seen by neurosurgery.  She has large frontal scalp hematoma.  Acute C1/C2 fractures: Seen by neurosurgery, they have recommended surgery but per them, it is not acute and can be done next week.  In the meantime, they have recommended physical therapy and continuing hard collar.  I have sent a message to Dr. Lovell Sheehan to find out more about his recommendations if he wishes for the patient to stay in the hospital or get discharged and follow-up as outpatient for surgery.  Waiting for response.   History of paroxysmal atrial fibrillation/ supratherapeutic INR: INR was only 3.5.  Patient received vitamin K and Kcentra in the ED and current INR is 1.1.  Although subarachnoid hemorrhage is ruled out but she has large frontal/forehead hematoma so we will continue to hold  Coumadin for now and reassess tomorrow morning.  Rates controlled, resume Toprol-XL.  Ground-level mechanical fall: Appears to have occurred as the patient tripped while attempting to open the door for her son, without any associated loss of consciousness, consult PT OT.   anion gap metabolic acidosis: Mild and improving.  Likely due to dehydration.  I will start her on gentle hydration with normal saline and repeat labs in the morning.   Chronic diastolic heart failure: Appears euvolemic.  Resume home medications.   CKD Stage 3 A: Baseline creatinine 1.2-1.6.  Currently at baseline.  Type 2 Diabetes Mellitus: Most recent A1c noted to be 7.2% when checked on 04/25/2022.  Only on Farxiga at home.  Will recheck hemoglobin A1c.  Continue SSI.   Hyperlipidemia: Continue statin.   acquired hypothyroidism: Continue provide.    DVT prophylaxis: SCDs Start: 08/24/22 2331   Code Status: Full Code  Family Communication:  None present at bedside.  Plan of care discussed with patient in length and he/she verbalized understanding and agreed with it.  Discussed with daughter [name] over the phone.  Status is: Inpatient Remains inpatient appropriate because: Patient with cervical vertebral fracture and hard collar.   Estimated body mass index is 30.23 kg/m as calculated from the following:   Height as of this encounter: 5\' 7"  (1.702 m).   Weight as of this encounter: 87.5 kg.    Nutritional Assessment: Body mass index is 30.23 kg/m.Marland Kitchen Seen by dietician.  I agree with the assessment and plan as outlined below: Nutrition Status:        .  Skin Assessment: I have examined the patient's skin and I agree with the wound assessment as performed by the wound care RN as outlined below:    Consultants:  Neurosurgery  Procedures:  As above  Antimicrobials:  Anti-infectives (From admission, onward)    None         Subjective: Patient seen and examined.  She says that her neck pain is  improving.  She has no other complaint.  She was curious why her neck surgery cannot be done sooner than later.  She was encouraged to discuss with neurosurgery.  She says that she feels that she has weakness in the hands bilaterally.  Objective: Vitals:   08/25/22 0658 08/25/22 0659 08/25/22 0700 08/25/22 0810  BP:   (!) 142/81   Pulse: 80 75 75   Resp: 14 14 13    Temp:    (!) 96.4 F (35.8 C)  TempSrc:    Axillary  SpO2: 96% 99% 99%   Weight:      Height:        Intake/Output Summary (Last 24 hours) at 08/25/2022 1021 Last data filed at 08/24/2022 2341 Gross per 24 hour  Intake 2096.69 ml  Output --  Net 2096.69 ml   Filed Weights   08/24/22 2025  Weight: 87.5 kg    Examination:  General exam: Appears calm and slightly comfortable, has a lot of bruises on the face with hematoma at the right forehead and hard neck collar Respiratory system: Clear to auscultation. Respiratory effort normal. Cardiovascular system: S1 & S2 heard, RRR. No JVD, murmurs, rubs, gallops or clicks. No pedal edema. Gastrointestinal system: Abdomen is nondistended, soft and nontender. No organomegaly or masses felt. Normal bowel sounds heard. Central nervous system: Alert and oriented.  Reduced strength in the hands bilaterally. Extremities: Symmetric 5 x 5 power. Skin: No rashes, lesions or ulcers Psychiatry: Judgement and insight appear normal. Mood & affect appropriate.    Data Reviewed: I have personally reviewed following labs and imaging studies  CBC: Recent Labs  Lab 08/24/22 2028 08/24/22 2030 08/25/22 0012  WBC 8.8  --  11.9*  NEUTROABS  --   --  9.9*  HGB 14.5 16.3* 14.5  HCT 46.7* 48.0* 46.4*  MCV 95.1  --  94.7  PLT 156  --  146*   Basic Metabolic Panel: Recent Labs  Lab 08/24/22 2028 08/24/22 2030 08/25/22 0012  NA 134* 134* 135  K 3.6 3.6 3.9  CL 101 102 103  CO2 15*  --  17*  GLUCOSE 107* 109* 119*  BUN 18 19 17   CREATININE 1.36* 0.90 1.12*  CALCIUM 8.7*  --   8.2*  MG  --   --  1.6*   GFR: Estimated Creatinine Clearance: 44.8 mL/min (A) (by C-G formula based on SCr of 1.12 mg/dL (H)). Liver Function Tests: Recent Labs  Lab 08/24/22 2028 08/25/22 0012  AST 18 18  ALT 12 14  ALKPHOS 50 48  BILITOT 1.4* 1.8*  PROT 5.9* 5.6*  ALBUMIN 2.8* 2.8*   No results for input(s): "LIPASE", "AMYLASE" in the last 168 hours. No results for input(s): "AMMONIA" in the last 168 hours. Coagulation Profile: Recent Labs  Lab 08/19/22 1505 08/24/22 2028 08/25/22 0012 08/25/22 0507  INR 1.5* 3.5* 1.1 1.1   Cardiac Enzymes: No results for input(s): "CKTOTAL", "CKMB", "CKMBINDEX", "TROPONINI" in the last 168 hours. BNP (last 3 results) No results for input(s): "PROBNP" in the last 8760 hours. HbA1C: No results for input(s): "HGBA1C" in the  last 72 hours. CBG: Recent Labs  Lab 08/25/22 0741  GLUCAP 85   Lipid Profile: No results for input(s): "CHOL", "HDL", "LDLCALC", "TRIG", "CHOLHDL", "LDLDIRECT" in the last 72 hours. Thyroid Function Tests: Recent Labs    08/25/22 0012  TSH 1.993   Anemia Panel: No results for input(s): "VITAMINB12", "FOLATE", "FERRITIN", "TIBC", "IRON", "RETICCTPCT" in the last 72 hours. Sepsis Labs: Recent Labs  Lab 08/24/22 2031 08/25/22 0507  LATICACIDVEN 2.2* 1.4    No results found for this or any previous visit (from the past 240 hour(s)).   Radiology Studies: MR Cervical Spine Wo Contrast  Addendum Date: 08/25/2022   ADDENDUM REPORT: 08/25/2022 05:23 ADDENDUM: Study discussed by telephone with Hospitalist Dr. Arlean Hopping on 08/25/2022 at 05:22. He advised that Neurosurgery is already engaged and he will make them aware of this report. Electronically Signed   By: Odessa Fleming M.D.   On: 08/25/2022 05:23   Result Date: 08/25/2022 CLINICAL DATA:  82 year old female status post fall on blood thinners, bilateral C1 ring and type 2 odontoid fracture with posterior displacement. Ossification of the posterior longitudinal  ligament (OPLL). EXAM: MRI CERVICAL SPINE WITHOUT CONTRAST TECHNIQUE: Multiplanar, multisequence MR imaging of the cervical spine was performed. No intravenous contrast was administered. COMPARISON:  Cervical spine CT yesterday FINDINGS: Alignment: Stable with straightening of cervical lordosis. Fracture alignment appears stable. Craniocervical alignment of the occipital condyles and C1 lateral masses appears maintained. Vertebrae: Marrow edema at the C1 and odontoid corresponding to known fractures, and curvilinear type 2 odontoid fracture plain is visible on sagittal T1 and T2 (series 5, image 8). Fracture alignment appears stable. Normal background bone marrow signal. Widespread and somewhat bulky vertebral endplate spurring throughout. Cord: Abnormal. Diffuse mass effect on the cervical spinal cord appears in large part related to bulky degenerative disease and Ossification of the posterior longitudinal ligament (OPLL). But heterogeneous abnormal T2 spinal cord signal at the C4 and C5 levels is indeterminate for edema versus cord contusion versus myelomalacia. The abnormal cord signal continues through the C5-C6 disc level. Above the C3-C4 disc, a small posterior column area of T2 hyperintensity on series 8, image 18 is also noted. On axial T2 * imaging areas of susceptibility seemingly in the canal (such as series 10 image 21 on the left at C3-C4) appear to correspond to exophytic endplate or PLL ossification by CT. No convincing spinal cord hemorrhage. Posterior Fossa, vertebral arteries, paraspinal tissues: Abnormal prevertebral fluid or edema from C1 through C6 suspicious for superimposed anterior longitudinal ligament injury. No inter spinous ligament signal abnormality. Tectorial membrane appears intact. Cervicomedullary junction is within normal limits. Grossly negative visible posterior fossa. Preserved major vascular flow voids in the neck. Partially retropharyngeal course of the carotids. Tortuous and  codominant appearing cervical vertebral arteries. Negative lung apices. Disc levels: C1-C2: Acute fractures, better detailed on CT. C2-C3: Disc, endplate and posterior element degeneration with no spinal stenosis. C3-C4: Severe disc space loss with bulky circumferential disc osteophyte complex and C4 OPLL. Moderate to severe spinal stenosis, spinal cord mass effect, and abnormal cord signal. Moderate to severe bilateral foraminal stenosis. C4-C5: Severe disc space loss with bulky disc osteophyte complex, C4 and C5 OPLL. Moderate to severe spinal stenosis and spinal cord mass effect plus cord signal abnormality. Moderate to severe bilateral foraminal stenosis. C5-C6: Similar bulky disc osteophyte complex and OPLL. Moderate spinal stenosis and spinal cord mass effect. Moderate to severe bilateral foraminal stenosis. C6-C7: Bulky disc osteophyte complex. C6 OPLL. Mild spinal stenosis. Spinal cord mass effect  and signal abnormality abate at this level. Mild to moderate bilateral foraminal stenosis. C7-T1:  Negative disc.  Mild facet hypertrophy. No visible upper thoracic stenosis. IMPRESSION: 1. Acute C1 and type 2 odontoid fractures superimposed on bulky cervical spine degeneration and Ossification of the posterior longitudinal ligament (OPLL). Fracture alignment appears stable from the CT yesterday. Evidence of associated anterior longitudinal ligament injury. No posterior ligamentous injury identified. 2. Subsequent multifactorial moderate and severe spinal stenosis, cord compression, and spinal cord signal abnormality C3 through C6. The spinal cord abnormality is indeterminate for posttraumatic cord contusion (nonhemorrhagic) versus cord edema versus myelomalacia. 3. Associated moderate to severe cervical foraminal stenosis from C3 through C6. Electronically Signed: By: Odessa Fleming M.D. On: 08/25/2022 05:06   CT Head Wo Contrast  Result Date: 08/25/2022 CLINICAL DATA:  Subarachnoid hemorrhage. EXAM: CT HEAD WITHOUT  CONTRAST TECHNIQUE: Contiguous axial images were obtained from the base of the skull through the vertex without intravenous contrast. RADIATION DOSE REDUCTION: This exam was performed according to the departmental dose-optimization program which includes automated exposure control, adjustment of the mA and/or kV according to patient size and/or use of iterative reconstruction technique. COMPARISON:  08/24/2022 FINDINGS: Brain: Previously noted area of possible subarachnoid hemorrhage along the right frontal lobe is no longer seen; this was most likely related to beam hardening artifact on the prior exam. No evidence of acute infarction, hemorrhage, mass, mass effect, or midline shift. No hydrocephalus or extra-axial fluid collection. Vascular: No hyperdense vessel. Skull: Negative for fracture or focal lesion. Redemonstrated frontal scalp hematoma, with increased right greater than left periorbital component. Sinuses/Orbits: Mucous retention cyst in the right maxillary sinus. Otherwise clear paranasal sinuses. No acute finding in the orbits. Other: The mastoid air cells are well aerated. IMPRESSION: 1. Previously noted area of possible subarachnoid hemorrhage along the right frontal lobe is no longer seen, most likely related to beam hardening artifact on the prior exam. No additional acute intracranial process. 2. Redemonstrated frontal scalp hematoma, with increased right greater than left periorbital component. Electronically Signed   By: Wiliam Ke M.D.   On: 08/25/2022 03:23   CT HEAD WO CONTRAST  Result Date: 08/24/2022 CLINICAL DATA:  Fall, on blood thinners EXAM: CT HEAD WITHOUT CONTRAST CT CERVICAL SPINE WITHOUT CONTRAST TECHNIQUE: Multidetector CT imaging of the head and cervical spine was performed following the standard protocol without intravenous contrast. Multiplanar CT image reconstructions of the cervical spine were also generated. RADIATION DOSE REDUCTION: This exam was performed according  to the departmental dose-optimization program which includes automated exposure control, adjustment of the mA and/or kV according to patient size and/or use of iterative reconstruction technique. COMPARISON:  07/21/2022 CT head and cervical spine FINDINGS: CT HEAD FINDINGS Brain: Suspect trace subarachnoid hemorrhage along the right frontal lobe (series 2, image 22), although evaluation is somewhat limited by beam hardening artifact. No parenchymal hemorrhage. No evidence of acute infarct, mass, mass effect, or midline shift. No hydrocephalus or extra-axial fluid collection. Partial empty sella. Age related cerebral atrophy. Periventricular white matter changes, likely the sequela of chronic small vessel ischemic disease. Vascular: No hyperdense vessel. Skull: Negative for fracture or focal lesion. Frontal scalp hematoma. Sinuses/Orbits: Small mucous retention cyst in the right maxillary sinus. Otherwise clear paranasal sinuses. No acute finding in the orbits. Other: The mastoid air cells are well aerated. CT CERVICAL SPINE FINDINGS Alignment: No traumatic listhesis. Skull base and vertebrae: Acute fracture through the base of the dens, extending into the posterior body (type 2) (series 8, images  29-33), which is posteriorly displaced approximately 4 mm. Bilateral fractures through the anterior arch of C1 (series 5, image 23), with approximately 4 mm of displacement. The fractures do not appear to involve the vertebral artery foramina. Osteopenia. Soft tissues and spinal canal: Trace prevertebral fluid. Suspect a small amount of hemorrhage along the dorsal aspect of C2, without significant mass effect on the thecal sac. 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 Upper chest: Negative. IMPRESSION: 1. Suspect trace  subarachnoid hemorrhage along the right frontal lobe, although evaluation is somewhat limited by beam hardening artifact. 2. Acute fracture through the base of the dens, extending into the posterior body (type 2) which is posteriorly displaced approximately 4 mm. 3. Bilateral fractures through the anterior arch of C1, with approximately 4 mm of displacement. 4. Suspect a small amount of hemorrhage along the dorsal aspect of C2, without significant mass effect on the thecal sac. This could be further evaluated with MRI. These results were called by telephone at the time of interpretation on 08/24/2022 at 9:21 pm to provider DAN FLOYD , who verbally acknowledged these results. Electronically Signed   By: Wiliam Ke M.D.   On: 08/24/2022 21:23   CT CERVICAL SPINE WO CONTRAST  Result Date: 08/24/2022 CLINICAL DATA:  Fall, on blood thinners EXAM: CT HEAD WITHOUT CONTRAST CT CERVICAL SPINE WITHOUT CONTRAST TECHNIQUE: Multidetector CT imaging of the head and cervical spine was performed following the standard protocol without intravenous contrast. Multiplanar CT image reconstructions of the cervical spine were also generated. RADIATION DOSE REDUCTION: This exam was performed according to the departmental dose-optimization program which includes automated exposure control, adjustment of the mA and/or kV according to patient size and/or use of iterative reconstruction technique. COMPARISON:  07/21/2022 CT head and cervical spine FINDINGS: CT HEAD FINDINGS Brain: Suspect trace subarachnoid hemorrhage along the right frontal lobe (series 2, image 22), although evaluation is somewhat limited by beam hardening artifact. No parenchymal hemorrhage. No evidence of acute infarct, mass, mass effect, or midline shift. No hydrocephalus or extra-axial fluid collection. Partial empty sella. Age related cerebral atrophy. Periventricular white matter changes, likely the sequela of chronic small vessel ischemic disease. Vascular: No  hyperdense vessel. Skull: Negative for fracture or focal lesion. Frontal scalp hematoma. Sinuses/Orbits: Small mucous retention cyst in the right maxillary sinus. Otherwise clear paranasal sinuses. No acute finding in the orbits. Other: The mastoid air cells are well aerated. CT CERVICAL SPINE FINDINGS Alignment: No traumatic listhesis. Skull base and vertebrae: Acute fracture through the base of the dens, extending into the posterior body (type 2) (series 8, images 29-33), which is posteriorly displaced approximately 4 mm. Bilateral fractures through the anterior arch of C1 (series 5, image 23), with approximately 4 mm of displacement. The fractures do not appear to involve the vertebral artery foramina. Osteopenia. Soft tissues and spinal canal: Trace prevertebral fluid. Suspect a small amount of hemorrhage along the dorsal aspect of C2, without significant mass effect on the thecal sac. 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 Upper chest: Negative. IMPRESSION: 1. Suspect trace subarachnoid hemorrhage along the right frontal lobe, although evaluation is somewhat limited by beam hardening artifact. 2. Acute fracture through the  base of the dens, extending into the posterior body (type 2) which is posteriorly displaced approximately 4 mm. 3. Bilateral fractures through the anterior arch of C1, with approximately 4 mm of displacement. 4. Suspect a small amount of hemorrhage along the dorsal aspect of C2, without significant mass effect on the thecal sac. This could be further evaluated with MRI. These results were called by telephone at the time of interpretation on 08/24/2022 at 9:21 pm to provider DAN FLOYD , who verbally acknowledged these results. Electronically Signed   By: Wiliam Ke M.D.   On: 08/24/2022 21:23    DG Chest Port 1 View  Result Date: 08/24/2022 CLINICAL DATA:  Trauma EXAM: PORTABLE CHEST 1 VIEW COMPARISON:  CT of the chest 12/15/2018 FINDINGS: There is prominence of the right paratracheal region corresponding to ectatic vasculature, unchanged. Heart size is within normal limits. There is no focal lung infiltrate, pleural effusion or pneumothorax. No acute fractures are seen. IMPRESSION: No active disease. Electronically Signed   By: Darliss Cheney M.D.   On: 08/24/2022 20:55   DG Pelvis Portable  Result Date: 08/24/2022 CLINICAL DATA:  Level 2 trauma on blood thinners. EXAM: PORTABLE PELVIS 1-2 VIEWS COMPARISON:  07/21/2022 FINDINGS: Left THA. No radiographic evidence of loosening. No acute fracture or dislocation. IVC filter. IMPRESSION: No evidence of acute fracture or dislocation. Electronically Signed   By: Minerva Fester M.D.   On: 08/24/2022 20:55    Scheduled Meds:  atorvastatin  40 mg Oral Daily   DULoxetine  30 mg Oral Daily   insulin aspart  0-6 Units Subcutaneous TID WC   sacubitril-valsartan  1 tablet Oral BID   Continuous Infusions:  magnesium sulfate bolus IVPB       LOS: 1 day   Hughie Closs, MD [facility]  08/25/2022, 10:21 AM   *Please note that this is a verbal dictation therefore any spelling or grammatical errors are due to the "[employer] One" system interpretation.  Please page via Amion and do not message via secure chat for urgent patient care matters. Secure chat can be used for non urgent patient care matters.  How to contact the [facility] Attending or Consulting provider 7A - 7P or covering provider during after hours 7P -7A, for this patient?  Check the care team in [facility] and look for a) attending/consulting [facility] provider listed and b) the [facility] team listed. Page or secure chat 7A-7P. Log into [email or web address] and use [facility] universal password to access. If you do not have the password, please contact the hospital operator. Locate the [facility]  provider you are looking for under [facility] and page to a number that you can be directly reached. If you still have difficulty reaching the provider, please page the [facility] (Director on Call) for the Hospitalists listed on amion for assistance.

## 2022-08-25 NOTE — ED Notes (Signed)
Neurosurgeon at bedside °

## 2022-08-25 NOTE — Progress Notes (Signed)
Patient to [phone] at this time

## 2022-08-25 NOTE — ED Notes (Signed)
Pt family would like to be in contact with pt's neurologist Dr Lovell Sheehan. Pt family member stated that she was instructed to touch base with neurologist about pt's medical procedures by the admitting Dr. Theola Sequin aware.

## 2022-08-25 NOTE — ED Notes (Signed)
ED TO INPATIENT HANDOFF REPORT  ED Nurse Name and Phone #: Victorino Dike [phone]  S Name/Age/Gender Hannah [name] 82 y.o. female Room/Bed: 044C/044C  Code Status   Code Status: Full Code  Home/SNF/Other Home Patient oriented to: self, place, time, and situation Is this baseline? Yes   Triage Complete: Triage complete  Chief Complaint Subarachnoid hematoma (HCC) [S06.6XAA]  Triage Note Pt arrives to ED for mechanical fall on thinners. Pt endorses head strike, unknown LOC. Pt on Coumadin. No obvious deformities A/o x 4  Trauma Response Nurse Documentation   Hannah [name] is a 82 y.o. female arriving to [facility] ED via [facility]  On warfarin daily. Trauma was activated as a Level 2 by Beverely Risen based on the following trauma criteria Elderly patients > 65 with head trauma on anti-coagulation (excluding ASA).  Patient cleared for CT by Dr. Adela Lank. Pt transported to CT with trauma response nurse present to monitor. RN remained with the patient throughout their absence from the department for clinical observation.   GCS 15.  History   Past Medical History:  Diagnosis Date   Acute massive pulmonary embolism (HCC) 07/13/2012   Massive PE w/ PEA arrest 07/13/12 >TNK >IVC filter >discharged on comadin     Anticoagulated on warfarin    Chronic diastolic CHF (congestive heart failure) (HCC) 03/15/2017   Chronic kidney disease, stage 3a (HCC) 12/15/2018   Diabetes mellitus, type 2 (HCC) 07/15/2012   with peripheral neuropathy   DM2 (diabetes mellitus, type 2) (HCC) 07/23/2012   HFrEF (heart failure with reduced ejection fraction) (HCC) 03/15/2017   Non-ischemic cardiomyopathy // Cardiac catheterization 01/01/21:  RCA prox to mid 20 // Echocardiogram 12/26/20:  EF 30-35, ant septum/apex, mid inf-sept AK; ant-lat/ant/inf HK, Gr 1 DD, mild LVH, normal RVSF, RVSP 27.2, small effusion (ant to RV), mild MR, mod TR, AV sclerosis w/o AS   Hyperlipemia 07/14/2012   Hyperlipidemia  02/25/2013   Hypertension 07/14/2012   Hypothyroid 06/06/2013   Large bowel stricture (HCC)    s/p colectomy in 2016   Neuropathy 02/12/2015   Osteoarthritis    s/p hip and knee replacements   PAF (paroxysmal atrial fibrillation) (HCC)    Pulmonary hypertension (HCC) 07/26/2016   Syncope 10/2018   Thyroid disease      Past Surgical History:  Procedure Laterality Date   COLONOSCOPY N/A 03/12/2016   Procedure: COLONOSCOPY;  Surgeon: Dorena Cookey, MD;  Location: [facility] ENDOSCOPY;  Service: Endoscopy;  Laterality: N/A;   COLOSTOMY N/A 06/03/2014   Procedure: COLOSTOMY;  Surgeon: Harriette Bouillon, MD;  Location: [facility] OR;  Service: General;  Laterality: N/A;   FLEXIBLE SIGMOIDOSCOPY N/A 05/30/2014   Procedure: Arnell Sieving;  Surgeon: Jeani Hawking, MD;  Location: [facility] ENDOSCOPY;  Service: Endoscopy;  Laterality: N/A;   INSERTION OF VENA CAVA FILTER N/A 07/16/2012   Procedure: INSERTION OF VENA CAVA FILTER;  Surgeon: [name], MD;  Location: [facility] CATH LAB;  Service: Cardiovascular;  Laterality: N/A;   KNEE ARTHROSCOPY     LEFT HEART CATH AND CORONARY ANGIOGRAPHY N/A 01/01/2021   Procedure: LEFT HEART CATH AND CORONARY ANGIOGRAPHY;  Surgeon: Kathleene Hazel, MD;  [facility] INVASIVE CV LAB;  Service: Cardiovascular;  Laterality: N/A;   LEFT HEART CATH AND CORONARY ANGIOGRAPHY N/A 04/29/2022   Procedure: LEFT HEART CATH AND CORONARY ANGIOGRAPHY;  Surgeon: Orbie Pyo, MD;  Location: [facility] INVASIVE CV LAB;  Service: Cardiovascular;  Laterality: N/A;   PARTIAL COLECTOMY N/A 06/03/2014   Procedure: PARTIAL COLECTOMY;  Surgeon: Harriette Bouillon, MD;  Location: [facility] OR;  Service: General;  Laterality: N/A;   REDUCTION MAMMAPLASTY Bilateral    RIGHT HEART CATH N/A 12/30/2020   Procedure: RIGHT HEART CATH;  Surgeon: Kathleene Hazel, MD;  Location: [facility] INVASIVE CV LAB;  Service: Cardiovascular;  Laterality: N/A;   SP ARTHRO HIP*L*         Initial Focused Assessment (If applicable, or please  see trauma documentation): Airway-- intact, no visible obstruction Breathing-- spontaneous, unlabored Circulation-- dried blood to the nose, no active bleeding on arrival to department  CT's Completed:   CT Head and CT C-Spine   Interventions:  See event note  Plan for disposition:  Admission to floor   Consults completed:  Neurosurgeon at 2130.  Event Summary: Patient brought in by [facility] from home. Patient had a mechanical fall this evening. Struck her head on the ground. On arrival patient alert and oriented x4, GCS 15. Patient transferred from EMS stretcher to hospital stretcher. Manual BP obtained, 114/74. 20G PIV established right forearm. Patient log rolled by team, patient denied any pain or tenderness. Xray chest and pelvis completed. Patient to CT with TRN and Primary RN. CT head, c-spine completed. Patient back to trauma bay at this time.   MTP Summary (If applicable):  N/A  Bedside handoff with ED RN [facility]    Leota Sauers  Trauma Response RN  Please call TRN at [phone] for further assistance.     Allergies Allergies  Allergen Reactions   Augmentin [Amoxicillin-Pot Clavulanate] Itching and Other (See Comments)    Severe vaginal itching   Tranxene [Clorazepate] Itching   Crestor [Rosuvastatin]     Made her sick on stomach, she is able to tolerate the zocor   Penicillins Itching and Rash    Level of Care/Admitting Diagnosis ED Disposition     ED Disposition  Admit   Condition  --   Comment  Hospital Area: [facility] [[id number]]  Level of Care: Progressive [102]  Admit to Progressive based on following criteria: MULTISYSTEM THREATS such as stable sepsis, metabolic/electrolyte imbalance with or without encephalopathy that is responding to early treatment.  May admit patient to [facility] or [facility] if equivalent level of care is available:: No  Covid Evaluation: Asymptomatic - no recent exposure (last 10 days) testing not  required  Diagnosis: Subarachnoid hematoma [facility]) [[id number]]  Admitting Physician: Angie Fava [[id number]]  Attending Physician: Angie Fava [[id number]]  Certification:: I certify this patient will need inpatient services for at least 2 midnights  Expected Medical Readiness: 08/26/2022          B Medical/Surgery History Past Medical History:  Diagnosis Date   Acute massive pulmonary embolism (HCC) 07/13/2012   Massive PE w/ PEA arrest 07/13/12 >TNK >IVC filter >discharged on comadin     Anticoagulated on warfarin    Chronic diastolic CHF (congestive heart failure) (HCC) 03/15/2017   Chronic kidney disease, stage 3a (HCC) 12/15/2018   Diabetes mellitus, type 2 (HCC) 07/15/2012   with peripheral neuropathy   DM2 (diabetes mellitus, type 2) (HCC) 07/23/2012   HFrEF (heart failure with reduced ejection fraction) (HCC) 03/15/2017   Non-ischemic cardiomyopathy // Cardiac catheterization 01/01/21:  RCA prox to mid 20 // Echocardiogram 12/26/20:  EF 30-35, ant septum/apex, mid inf-sept AK; ant-lat/ant/inf HK, Gr 1 DD, mild LVH, normal RVSF, RVSP 27.2, small effusion (ant to RV), mild MR, mod TR, AV sclerosis w/o AS   Hyperlipemia 07/14/2012   Hyperlipidemia  02/25/2013   Hypertension 07/14/2012   Hypothyroid 06/06/2013   Large bowel stricture (HCC)    s/p colectomy in 2016   Neuropathy 02/12/2015   Osteoarthritis    s/p hip and knee replacements   PAF (paroxysmal atrial fibrillation) (HCC)    Pulmonary hypertension (HCC) 07/26/2016   Syncope 10/2018   Thyroid disease    Past Surgical History:  Procedure Laterality Date   COLONOSCOPY N/A 03/12/2016   Procedure: COLONOSCOPY;  Surgeon: Dorena Cookey, MD;  Location: [facility] ENDOSCOPY;  Service: Endoscopy;  Laterality: N/A;   COLOSTOMY N/A 06/03/2014   Procedure: COLOSTOMY;  Surgeon: Harriette Bouillon, MD;  Location: [facility] OR;  Service: General;  Laterality: N/A;   FLEXIBLE SIGMOIDOSCOPY N/A 05/30/2014   Procedure: Arnell Sieving;  Surgeon: Jeani Hawking, MD;  Location: [facility] ENDOSCOPY;  Service: Endoscopy;  Laterality: N/A;   INSERTION OF VENA CAVA FILTER N/A 07/16/2012   Procedure: INSERTION OF VENA CAVA FILTER;  Surgeon: [name], MD;  Location: [facility] CATH LAB;  Service: Cardiovascular;  Laterality: N/A;   KNEE ARTHROSCOPY     LEFT HEART CATH AND CORONARY ANGIOGRAPHY N/A 01/01/2021   Procedure: LEFT HEART CATH AND CORONARY ANGIOGRAPHY;  Surgeon: Kathleene Hazel, MD;  [facility] INVAS[facility] CV LAB;  Service: Cardiovascular;  Laterality: N/A;   LEFT HEART CATH AND CORONARY ANGIOGRAPHY N/A 04/29/2022   Procedure: LEFT HEART CATH AND CORONARY ANGIOGRAPHY;  Surgeon: Orbie Pyo, MD;  [facility] INVAS[facility] CV LAB;  Service: Cardiovascular;  Laterality: N/A;   PARTIAL COLECTOMY N/A 06/03/2014   Procedure: PARTIAL COLECTOMY;  Surgeon: Harriette Bouillon, MD;  Location: [facility] OR;  Service: General;  Laterality: N/A;   REDUCTION MAMMAPLASTY Bilateral    RIGHT HEART CATH N/A 12/30/2020   Procedure: RIGHT HEART CATH;  Surgeon: Kathleene Hazel, MD;  [facility] INVAS[facility] CV LAB;  Service: Cardiovascular;  Laterality: N/A;   SP ARTHRO HIP*L*       A IV Location/Drains/Wounds Patient Lines/Drains/Airways Status     Active Line/Drains/Airways     Name Placement date Placement time Site Days   Peripheral IV 08/24/22 20 G 1" Posterior;Right Forearm 08/24/22  2018  Forearm  1   External Urinary Catheter 08/25/22  0809  --  less than 1            Intake/Output Last 24 hours  Intake/Output Summary (Last 24 hours) at 08/25/2022 1509 Last data filed at 08/24/2022 2341 Gross per 24 hour  Intake 2096.69 ml  Output --  Net 2096.69 ml    Labs/Imaging Results for orders placed or performed during the hospital encounter of 08/24/22 (from the past 48 hour(s))  Comprehensive metabolic panel     Status: Abnormal   Collection Time: 08/24/22  8:28 PM  Result Value Ref Range   Sodium 134 (L) 135 - 145 mmol/L   Potassium 3.6 3.5 - 5.1  mmol/L   Chloride 101 98 - 111 mmol/L   CO2 15 (L) 22 - 32 mmol/L   Glucose, Bld 107 (H) 70 - 99 mg/dL    Comment: Glucose reference range applies only to samples taken after fasting for at least 8 hours.   BUN 18 8 - 23 mg/dL   Creatinine, Ser 5.78 (H) 0.44 - 1.00 mg/dL   Calcium 8.7 (L) 8.9 - 10.3 mg/dL   Total Protein 5.9 (L) 6.5 - 8.1 g/dL   Albumin 2.8 (L) 3.5 - 5.0 g/dL   AST 18 15 - 41 U/L   ALT 12 0 - 44  U/L   Alkaline Phosphatase 50 38 - 126 U/L   Total Bilirubin 1.4 (H) 0.3 - 1.2 mg/dL   GFR, Estimated 39 (L) >60 mL/min    Comment: (NOTE) Calculated using the CKD-EPI Creatinine Equation (2021)    Anion gap 18 (H) 5 - 15    Comment: Performed at [facility] Lab, [address], [place], [place] [address]  CBC     Status: Abnormal   Collection Time: 08/24/22  8:28 PM  Result Value Ref Range   WBC 8.8 4.0 - 10.5 K/uL   RBC 4.91 3.87 - 5.11 MIL/uL   Hemoglobin 14.5 12.0 - 15.0 g/dL   HCT 60.4 (H) 54.0 - 98.1 %   MCV 95.1 80.0 - 100.0 fL   MCH 29.5 26.0 - 34.0 pg   MCHC 31.0 30.0 - 36.0 g/dL   RDW 19.1 (H) 47.8 - 29.5 %   Platelets 156 150 - 400 K/uL   nRBC 0.0 0.0 - 0.2 %    Comment: Performed at [facility] Lab, [address] [place], [place] [address]  Ethanol     Status: None   Collection Time: 08/24/22  8:28 PM  Result Value Ref Range   Alcohol, Ethyl (B) <10 <10 mg/dL    Comment: (NOTE) Lowest detectable limit for serum alcohol is 10 mg/dL.  For medical purposes only. Performed at [facility] Lab, [address] [place], [place] [address]   Protime-INR     Status: Abnormal   Collection Time: 08/24/22  8:28 PM  Result Value Ref Range   Prothrombin Time 35.4 (H) 11.4 - 15.2 seconds   INR 3.5 (H) 0.8 - 1.2    Comment: (NOTE) INR goal varies based on device and disease states. Performed at [facility] Lab, [address], [place], [place] [address]   Sample to Blood Bank     Status: None   Collection Time: 08/24/22  8:28 PM  Result Value  Ref Range   Blood Bank Specimen SAMPLE AVAILABLE FOR TESTING    Sample Expiration      08/27/2022,2359 Performed at [facility] Lab, [address], [place], [place] [address]   I-Stat Chem 8, ED     Status: Abnormal   Collection Time: 08/24/22  8:30 PM  Result Value Ref Range   Sodium 134 (L) 135 - 145 mmol/L   Potassium 3.6 3.5 - 5.1 mmol/L   Chloride 102 98 - 111 mmol/L   BUN 19 8 - 23 mg/dL   Creatinine, Ser 1.32 0.44 - 1.00 mg/dL   Glucose, Bld 440 (H) 70 - 99 mg/dL    Comment: Glucose reference range applies only to samples taken after fasting for at least 8 hours.   Calcium, Ion 1.09 (L) 1.15 - 1.40 mmol/L   TCO2 18 (L) 22 - 32 mmol/L   Hemoglobin 16.3 (H) 12.0 - 15.0 g/dL   HCT 10.2 (H) 72.5 - 36.6 %  I-Stat Lactic Acid, ED     Status: Abnormal   Collection Time: 08/24/22  8:31 PM  Result Value Ref Range   Lactic Acid, Venous 2.2 (HH) 0.5 - 1.9 mmol/L   Comment NOTIFIED PHYSICIAN   CBC with Differential/Platelet     Status: Abnormal   Collection Time: 08/25/22 12:12 AM  Result Value Ref Range   WBC 11.9 (H) 4.0 - 10.5 K/uL   RBC 4.90 3.87 - 5.11 MIL/uL   Hemoglobin 14.5 12.0 - 15.0 g/dL   HCT 44.0 (H)  36.0 - 46.0 %   MCV 94.7 80.0 - 100.0 fL   MCH 29.6 26.0 - 34.0 pg   MCHC 31.3 30.0 - 36.0 g/dL   RDW 01.0 (H) 27.2 - 53.6 %   Platelets 146 (L) 150 - 400 K/uL   nRBC 0.0 0.0 - 0.2 %   Neutrophils Relative % 83 %   Neutro Abs 9.9 (H) 1.7 - 7.7 K/uL   Lymphocytes Relative 12 %   Lymphs Abs 1.4 0.7 - 4.0 K/uL   Monocytes Relative 5 %   Monocytes Absolute 0.6 0.1 - 1.0 K/uL   Eosinophils Relative 0 %   Eosinophils Absolute 0.0 0.0 - 0.5 K/uL   Basophils Relative 0 %   Basophils Absolute 0.0 0.0 - 0.1 K/uL   Immature Granulocytes 0 %   Abs Immature Granulocytes 0.05 0.00 - 0.07 K/uL    Comment: Performed at [facility] Lab, [address], [place], [place] [address]  Comprehensive metabolic panel     Status: Abnormal   Collection Time: 08/25/22 12:12 AM   Result Value Ref Range   Sodium 135 135 - 145 mmol/L   Potassium 3.9 3.5 - 5.1 mmol/L   Chloride 103 98 - 111 mmol/L   CO2 17 (L) 22 - 32 mmol/L   Glucose, Bld 119 (H) 70 - 99 mg/dL    Comment: Glucose reference range applies only to samples taken after fasting for at least 8 hours.   BUN 17 8 - 23 mg/dL   Creatinine, Ser 4.74 (H) 0.44 - 1.00 mg/dL   Calcium 8.2 (L) 8.9 - 10.3 mg/dL   Total Protein 5.6 (L) 6.5 - 8.1 g/dL   Albumin 2.8 (L) 3.5 - 5.0 g/dL   AST 18 15 - 41 U/L   ALT 14 0 - 44 U/L   Alkaline Phosphatase 48 38 - 126 U/L   Total Bilirubin 1.8 (H) 0.3 - 1.2 mg/dL   GFR, Estimated 49 (L) >60 mL/min    Comment: (NOTE) Calculated using the CKD-EPI Creatinine Equation (2021)    Anion gap 15 5 - 15    Comment: Performed at [facility] Lab, [address], [place], [place] [address]  Magnesium     Status: Abnormal   Collection Time: 08/25/22 12:12 AM  Result Value Ref Range   Magnesium 1.6 (L) 1.7 - 2.4 mg/dL    Comment: Performed at [facility] Lab, [address] [place], [place] [address]  Protime-INR     Status: None   Collection Time: 08/25/22 12:12 AM  Result Value Ref Range   Prothrombin Time 14.6 11.4 - 15.2 seconds   INR 1.1 0.8 - 1.2    Comment: (NOTE) INR goal varies based on device and disease states. Performed at [facility] Lab, [address], [place], [place] [address]   TSH     Status: None   Collection Time: 08/25/22 12:12 AM  Result Value Ref Range   TSH 1.993 0.350 - 4.500 uIU/mL    Comment: Performed by a 3rd Generation assay with a functional sensitivity of <=0.01 uIU/mL. Performed at [facility] Lab, [address] [place], [place] [address]   Hemoglobin A1c     Status: Abnormal   Collection Time: 08/25/22 12:12 AM  Result Value Ref Range   Hgb A1c MFr Bld 6.0 (H) 4.8 - 5.6 %    Comment: (NOTE) Pre diabetes:          5.7%-6.4%  Diabetes:              >  6.4%  Glycemic control for   <7.0% adults with diabetes    Mean  Plasma Glucose 125.5 mg/dL    Comment: Performed at [facility], [address] [place], [place] [address]  Lactic acid, plasma     Status: None   Collection Time: 08/25/22  5:07 AM  Result Value Ref Range   Lactic Acid, Venous 1.4 0.5 - 1.9 mmol/L    Comment: Performed at [facility] Lab, [address], [place], [place] [address]  Protime-INR     Status: None   Collection Time: 08/25/22  5:07 AM  Result Value Ref Range   Prothrombin Time 13.9 11.4 - 15.2 seconds   INR 1.1 0.8 - 1.2    Comment: (NOTE) INR goal varies based on device and disease states. Performed at [facility] Lab, [address] [place], [place] [address]   CBG monitoring, ED     Status: None   Collection Time: 08/25/22  7:41 AM  Result Value Ref Range   Glucose-Capillary 85 70 - 99 mg/dL    Comment: Glucose reference range applies only to samples taken after fasting for at least 8 hours.  CBG monitoring, ED     Status: None   Collection Time: 08/25/22 11:27 AM  Result Value Ref Range   Glucose-Capillary 86 70 - 99 mg/dL    Comment: Glucose reference range applies only to samples taken after fasting for at least 8 hours.   MR Cervical Spine Wo Contrast  Addendum Date: 08/25/2022   ADDENDUM REPORT: 08/25/2022 05:23 ADDENDUM: Study discussed by telephone with Hospitalist Dr. Arlean Hopping on 08/25/2022 at 05:22. He advised that Neurosurgery is already engaged and he will make them aware of this report. Electronically Signed   By: Odessa Fleming M.D.   On: 08/25/2022 05:23   Result Date: 08/25/2022 CLINICAL DATA:  82 year old female status post fall on blood thinners, bilateral C1 ring and type 2 odontoid fracture with posterior displacement. Ossification of the posterior longitudinal ligament (OPLL). EXAM: MRI CERVICAL SPINE WITHOUT CONTRAST TECHNIQUE: Multiplanar, multisequence MR imaging of the cervical spine was performed. No intravenous contrast was administered. COMPARISON:  Cervical spine CT yesterday  FINDINGS: Alignment: Stable with straightening of cervical lordosis. Fracture alignment appears stable. Craniocervical alignment of the occipital condyles and C1 lateral masses appears maintained. Vertebrae: Marrow edema at the C1 and odontoid corresponding to known fractures, and curvilinear type 2 odontoid fracture plain is visible on sagittal T1 and T2 (series 5, image 8). Fracture alignment appears stable. Normal background bone marrow signal. Widespread and somewhat bulky vertebral endplate spurring throughout. Cord: Abnormal. Diffuse mass effect on the cervical spinal cord appears in large part related to bulky degenerative disease and Ossification of the posterior longitudinal ligament (OPLL). But heterogeneous abnormal T2 spinal cord signal at the C4 and C5 levels is indeterminate for edema versus cord contusion versus myelomalacia. The abnormal cord signal continues through the C5-C6 disc level. Above the C3-C4 disc, a small posterior column area of T2 hyperintensity on series 8, image 18 is also noted. On axial T2 * imaging areas of susceptibility seemingly in the canal (such as series 10 image 21 on the left at C3-C4) appear to correspond to exophytic endplate or PLL ossification by CT. No convincing spinal cord hemorrhage. Posterior Fossa, vertebral arteries, paraspinal tissues: Abnormal prevertebral fluid or edema from C1 through C6 suspicious for superimposed anterior longitudinal ligament injury. No inter spinous ligament signal abnormality. Tectorial membrane appears intact. Cervicomedullary junction is within normal  limits. Grossly negative visible posterior fossa. Preserved major vascular flow voids in the neck. Partially retropharyngeal course of the carotids. Tortuous and codominant appearing cervical vertebral arteries. Negative lung apices. Disc levels: C1-C2: Acute fractures, better detailed on CT. C2-C3: Disc, endplate and posterior element degeneration with no spinal stenosis. C3-C4: Severe  disc space loss with bulky circumferential disc osteophyte complex and C4 OPLL. Moderate to severe spinal stenosis, spinal cord mass effect, and abnormal cord signal. Moderate to severe bilateral foraminal stenosis. C4-C5: Severe disc space loss with bulky disc osteophyte complex, C4 and C5 OPLL. Moderate to severe spinal stenosis and spinal cord mass effect plus cord signal abnormality. Moderate to severe bilateral foraminal stenosis. C5-C6: Similar bulky disc osteophyte complex and OPLL. Moderate spinal stenosis and spinal cord mass effect. Moderate to severe bilateral foraminal stenosis. C6-C7: Bulky disc osteophyte complex. C6 OPLL. Mild spinal stenosis. Spinal cord mass effect and signal abnormality abate at this level. Mild to moderate bilateral foraminal stenosis. C7-T1:  Negative disc.  Mild facet hypertrophy. No visible upper thoracic stenosis. IMPRESSION: 1. Acute C1 and type 2 odontoid fractures superimposed on bulky cervical spine degeneration and Ossification of the posterior longitudinal ligament (OPLL). Fracture alignment appears stable from the CT yesterday. Evidence of associated anterior longitudinal ligament injury. No posterior ligamentous injury identified. 2. Subsequent multifactorial moderate and severe spinal stenosis, cord compression, and spinal cord signal abnormality C3 through C6. The spinal cord abnormality is indeterminate for posttraumatic cord contusion (nonhemorrhagic) versus cord edema versus myelomalacia. 3. Associated moderate to severe cervical foraminal stenosis from C3 through C6. Electronically Signed: By: Odessa Fleming M.D. On: 08/25/2022 05:06   CT Head Wo Contrast  Result Date: 08/25/2022 CLINICAL DATA:  Subarachnoid hemorrhage. EXAM: CT HEAD WITHOUT CONTRAST TECHNIQUE: Contiguous axial images were obtained from the base of the skull through the vertex without intravenous contrast. RADIATION DOSE REDUCTION: This exam was performed according to the departmental  dose-optimization program which includes automated exposure control, adjustment of the mA and/or kV according to patient size and/or use of iterative reconstruction technique. COMPARISON:  08/24/2022 FINDINGS: Brain: Previously noted area of possible subarachnoid hemorrhage along the right frontal lobe is no longer seen; this was most likely related to beam hardening artifact on the prior exam. No evidence of acute infarction, hemorrhage, mass, mass effect, or midline shift. No hydrocephalus or extra-axial fluid collection. Vascular: No hyperdense vessel. Skull: Negative for fracture or focal lesion. Redemonstrated frontal scalp hematoma, with increased right greater than left periorbital component. Sinuses/Orbits: Mucous retention cyst in the right maxillary sinus. Otherwise clear paranasal sinuses. No acute finding in the orbits. Other: The mastoid air cells are well aerated. IMPRESSION: 1. Previously noted area of possible subarachnoid hemorrhage along the right frontal lobe is no longer seen, most likely related to beam hardening artifact on the prior exam. No additional acute intracranial process. 2. Redemonstrated frontal scalp hematoma, with increased right greater than left periorbital component. Electronically Signed   By: Wiliam Ke M.D.   On: 08/25/2022 03:23   CT HEAD WO CONTRAST  Result Date: 08/24/2022 CLINICAL DATA:  Fall, on blood thinners EXAM: CT HEAD WITHOUT CONTRAST CT CERVICAL SPINE WITHOUT CONTRAST TECHNIQUE: Multidetector CT imaging of the head and cervical spine was performed following the standard protocol without intravenous contrast. Multiplanar CT image reconstructions of the cervical spine were also generated. RADIATION DOSE REDUCTION: This exam was performed according to the departmental dose-optimization program which includes automated exposure control, adjustment of the mA and/or kV according to patient size  and/or use of iterative reconstruction technique. COMPARISON:   07/21/2022 CT head and cervical spine FINDINGS: CT HEAD FINDINGS Brain: Suspect trace subarachnoid hemorrhage along the right frontal lobe (series 2, image 22), although evaluation is somewhat limited by beam hardening artifact. No parenchymal hemorrhage. No evidence of acute infarct, mass, mass effect, or midline shift. No hydrocephalus or extra-axial fluid collection. Partial empty sella. Age related cerebral atrophy. Periventricular white matter changes, likely the sequela of chronic small vessel ischemic disease. Vascular: No hyperdense vessel. Skull: Negative for fracture or focal lesion. Frontal scalp hematoma. Sinuses/Orbits: Small mucous retention cyst in the right maxillary sinus. Otherwise clear paranasal sinuses. No acute finding in the orbits. Other: The mastoid air cells are well aerated. CT CERVICAL SPINE FINDINGS Alignment: No traumatic listhesis. Skull base and vertebrae: Acute fracture through the base of the dens, extending into the posterior body (type 2) (series 8, images 29-33), which is posteriorly displaced approximately 4 mm. Bilateral fractures through the anterior arch of C1 (series 5, image 23), with approximately 4 mm of displacement. The fractures do not appear to involve the vertebral artery foramina. Osteopenia. Soft tissues and spinal canal: Trace prevertebral fluid. Suspect a small amount of hemorrhage along the dorsal aspect of C2, without significant mass effect on the thecal sac. 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 Upper chest: Negative. IMPRESSION: 1. Suspect trace subarachnoid hemorrhage along the right frontal lobe, although evaluation is somewhat limited by beam hardening artifact. 2. Acute fracture through the base of the dens, extending into the posterior body (type 2) which  is posteriorly displaced approximately 4 mm. 3. Bilateral fractures through the anterior arch of C1, with approximately 4 mm of displacement. 4. Suspect a small amount of hemorrhage along the dorsal aspect of C2, without significant mass effect on the thecal sac. This could be further evaluated with MRI. These results were called by telephone at the time of interpretation on 08/24/2022 at 9:21 pm to provider DAN FLOYD , who verbally acknowledged these results. Electronically Signed   By: Wiliam Ke M.D.   On: 08/24/2022 21:23   CT CERVICAL SPINE WO CONTRAST  Result Date: 08/24/2022 CLINICAL DATA:  Fall, on blood thinners EXAM: CT HEAD WITHOUT CONTRAST CT CERVICAL SPINE WITHOUT CONTRAST TECHNIQUE: Multidetector CT imaging of the head and cervical spine was performed following the standard protocol without intravenous contrast. Multiplanar CT image reconstructions of the cervical spine were also generated. RADIATION DOSE REDUCTION: This exam was performed according to the departmental dose-optimization program which includes automated exposure control, adjustment of the mA and/or kV according to patient size and/or use of iterative reconstruction technique. COMPARISON:  07/21/2022 CT head and cervical spine FINDINGS: CT HEAD FINDINGS Brain: Suspect trace subarachnoid hemorrhage along the right frontal lobe (series 2, image 22), although evaluation is somewhat limited by beam hardening artifact. No parenchymal hemorrhage. No evidence of acute infarct, mass, mass effect, or midline shift. No hydrocephalus or extra-axial fluid collection. Partial empty sella. Age related cerebral atrophy. Periventricular white matter changes, likely the sequela of chronic small vessel ischemic disease. Vascular: No hyperdense vessel. Skull: Negative for fracture or focal lesion. Frontal scalp hematoma. Sinuses/Orbits: Small mucous retention cyst in the right maxillary sinus. Otherwise clear paranasal sinuses. No acute finding in  the orbits. Other: The mastoid air cells are well aerated. CT CERVICAL SPINE FINDINGS Alignment:  No traumatic listhesis. Skull base and vertebrae: Acute fracture through the base of the dens, extending into the posterior body (type 2) (series 8, images 29-33), which is posteriorly displaced approximately 4 mm. Bilateral fractures through the anterior arch of C1 (series 5, image 23), with approximately 4 mm of displacement. The fractures do not appear to involve the vertebral artery foramina. Osteopenia. Soft tissues and spinal canal: Trace prevertebral fluid. Suspect a small amount of hemorrhage along the dorsal aspect of C2, without significant mass effect on the thecal sac. 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 Upper chest: Negative. IMPRESSION: 1. Suspect trace subarachnoid hemorrhage along the right frontal lobe, although evaluation is somewhat limited by beam hardening artifact. 2. Acute fracture through the base of the dens, extending into the posterior body (type 2) which is posteriorly displaced approximately 4 mm. 3. Bilateral fractures through the anterior arch of C1, with approximately 4 mm of displacement. 4. Suspect a small amount of hemorrhage along the dorsal aspect of C2, without significant mass effect on the thecal sac. This could be further evaluated with MRI. These results were called by telephone at the time of interpretation on 08/24/2022 at 9:21 pm to provider DAN FLOYD , who verbally acknowledged these results. Electronically Signed   By: Wiliam Ke M.D.   On: 08/24/2022 21:23   DG Chest Port 1 View  Result Date: 08/24/2022 CLINICAL DATA:  Trauma EXAM: PORTABLE CHEST 1 VIEW COMPARISON:  CT of the chest 12/15/2018 FINDINGS: There is prominence of the right paratracheal region corresponding  to ectatic vasculature, unchanged. Heart size is within normal limits. There is no focal lung infiltrate, pleural effusion or pneumothorax. No acute fractures are seen. IMPRESSION: No active disease. Electronically Signed   By: Darliss Cheney M.D.   On: 08/24/2022 20:55   DG Pelvis Portable  Result Date: 08/24/2022 CLINICAL DATA:  Level 2 trauma on blood thinners. EXAM: PORTABLE PELVIS 1-2 VIEWS COMPARISON:  07/21/2022 FINDINGS: Left THA. No radiographic evidence of loosening. No acute fracture or dislocation. IVC filter. IMPRESSION: No evidence of acute fracture or dislocation. Electronically Signed   By: Minerva Fester M.D.   On: 08/24/2022 20:55    Pending Labs Unresulted Labs (From admission, onward)     Start     Ordered   08/25/22 0500  Protime-INR  Daily,   R      08/25/22 0222   08/24/22 2020  Urinalysis, Routine w reflex microscopic -Urine, Clean Catch  [facility] ED TRAUMA PANEL [facility])  Once,   URGENT       Question:  Specimen Source  Answer:  Urine, Clean Catch   08/24/22 2020            Vitals/Pain Today's Vitals   08/25/22 1131 08/25/22 1158 08/25/22 1300 08/25/22 1330  BP: (!) 158/87 115/66 117/81 (!) 106/58  Pulse: 72 68 96 65  Resp: 13 16 13 13   Temp:  97.6 F (36.4 C)    TempSrc:  Oral    SpO2: 98% 96% 100% 100%  Weight:      Height:      PainSc:        Isolation Precautions No active isolations  Medications Medications  acetaminophen (TYLENOL) tablet 650 mg (650 mg Oral Given 08/25/22 1033)    Or  acetaminophen (TYLENOL) suppository 650 mg ( Rectal See  Alternative 08/25/22 1033)  melatonin tablet 3 mg (has no administration in time range)  ondansetron (ZOFRAN) injection 4 mg (has no administration in time range)  hydrALAZINE (APRESOLINE) injection 10 mg (has no administration in time range)  atorvastatin (LIPITOR) tablet 40 mg (40 mg Oral Given 08/25/22 1033)  DULoxetine (CYMBALTA) DR capsule 30 mg (30 mg Oral Given 08/25/22 1033)  sacubitril-valsartan  (ENTRESTO) 24-26 mg per tablet (1 tablet Oral Given 08/25/22 1033)  insulin aspart (novoLOG) injection 0-6 Units ( Subcutaneous Not Given 08/25/22 1129)  levothyroxine (SYNTHROID) tablet 25 mcg (has no administration in time range)  metoprolol succinate (TOPROL-XL) 24 hr tablet 25 mg (25 mg Oral Given 08/25/22 1143)  nitroGLYCERIN (NITROSTAT) SL tablet 0.4 mg (has no administration in time range)  pantoprazole (PROTONIX) EC tablet 40 mg (40 mg Oral Given 08/25/22 1143)  spironolactone (ALDACTONE) tablet 12.5 mg (12.5 mg Oral Given 08/25/22 1143)  0.9 %  sodium chloride infusion ( Intravenous New Bag/Given 08/25/22 1148)  HYDROmorphone (DILAUDID) injection 0.5 mg (0.5 mg Intravenous Given 08/25/22 1144)  sodium chloride 0.9 % bolus 1,000 mL (0 mLs Intravenous Stopped 08/24/22 2157)  sodium chloride 0.9 % bolus 1,000 mL (0 mLs Intravenous Stopped 08/24/22 2318)  phytonadione (VITAMIN K) 10 mg in dextrose 5 % 50 mL IVPB (0 mg Intravenous Stopped 08/24/22 2341)  prothrombin complex conc human (KCENTRA) IVPB 1,552 Units (0 Units Intravenous Stopped 08/24/22 2314)  ondansetron (ZOFRAN) injection 4 mg (4 mg Intravenous Given 08/24/22 2318)  magnesium sulfate IVPB 2 g 50 mL (0 g Intravenous Stopped 08/25/22 1149)    Mobility non-ambulatory     Focused Assessments     R Recommendations: See Admitting Provider Note  Report given to:   Additional Notes:

## 2022-08-25 NOTE — TOC CAGE-AID Note (Signed)
Transition of Care [facility]) - CAGE-AID Screening   Patient Details  Name: Shadavia Muffoletto MRN: [id number] Date of Birth: Jun 25, 1940  Transition of Care [facility]) CM/SW Contact:    Leota Sauers, RN Phone Number: 08/25/2022, 6:59 AM   Clinical Narrative:  Patient denies use of alcohol and illicit drugs. Resources not given at this time.   CAGE-AID Screening:    Have You Ever Felt You Ought to Cut Down on Your Drinking or Drug Use?: No Have People Annoyed You By Critizing Your Drinking Or Drug Use?: No Have You Felt Bad Or Guilty About Your Drinking Or Drug Use?: No Have You Ever Had a Drink or Used Drugs First Thing In The Morning to Steady Your Nerves or to Get Rid of a Hangover?: No CAGE-AID Score: 0  Substance Abuse Education Offered: No

## 2022-08-25 NOTE — Progress Notes (Signed)
Entresto not given, BP soft, last reading 90/60. Opyd, MD notified.

## 2022-08-25 NOTE — ED Notes (Signed)
Pt to MRI w/ RN

## 2022-08-25 NOTE — ED Notes (Signed)
Family updated as to patient's status.

## 2022-08-26 ENCOUNTER — Other Ambulatory Visit: Payer: Self-pay | Admitting: Neurosurgery

## 2022-08-26 ENCOUNTER — Other Ambulatory Visit: Payer: Self-pay

## 2022-08-26 ENCOUNTER — Inpatient Hospital Stay (HOSPITAL_COMMUNITY): Payer: Medicare Other

## 2022-08-26 DIAGNOSIS — I5032 Chronic diastolic (congestive) heart failure: Secondary | ICD-10-CM

## 2022-08-26 DIAGNOSIS — S12000A Unspecified displaced fracture of first cervical vertebra, initial encounter for closed fracture: Secondary | ICD-10-CM | POA: Diagnosis not present

## 2022-08-26 DIAGNOSIS — E861 Hypovolemia: Secondary | ICD-10-CM

## 2022-08-26 DIAGNOSIS — S12111A Posterior displaced Type II dens fracture, initial encounter for closed fracture: Secondary | ICD-10-CM

## 2022-08-26 DIAGNOSIS — E039 Hypothyroidism, unspecified: Secondary | ICD-10-CM | POA: Diagnosis not present

## 2022-08-26 DIAGNOSIS — S12100A Unspecified displaced fracture of second cervical vertebra, initial encounter for closed fracture: Secondary | ICD-10-CM | POA: Diagnosis not present

## 2022-08-26 LAB — PROTIME-INR
INR: >10 (ref 0.8–1.2)
INR: 1 (ref 0.8–1.2)
Prothrombin Time: >90 s — ABNORMAL HIGH (ref 11.4–15.2)
Prothrombin Time: 13.7 seconds (ref 11.4–15.2)

## 2022-08-26 LAB — BASIC METABOLIC PANEL
Anion gap: 11 (ref 5–15)
BUN: 24 mg/dL — ABNORMAL HIGH (ref 8–23)
CO2: 17 mmol/L — ABNORMAL LOW (ref 22–32)
Calcium: 8.3 mg/dL — ABNORMAL LOW (ref 8.9–10.3)
Chloride: 107 mmol/L (ref 98–111)
Creatinine, Ser: 1.51 mg/dL — ABNORMAL HIGH (ref 0.44–1.00)
GFR, Estimated: 35 mL/min — ABNORMAL LOW (ref >60)
Glucose, Bld: 117 mg/dL — ABNORMAL HIGH (ref 70–99)
Potassium: 4.3 mmol/L (ref 3.5–5.1)
Sodium: 135 mmol/L (ref 135–145)

## 2022-08-26 LAB — ECHOCARDIOGRAM LIMITED
Height: 67 in
S' Lateral: 2.1 cm
Weight: 2811.31 oz

## 2022-08-26 LAB — CBC
HCT: 44.3 % (ref 36.0–46.0)
Hemoglobin: 13.5 g/dL (ref 12.0–15.0)
MCH: 28.7 pg (ref 26.0–34.0)
MCHC: 30.5 g/dL (ref 30.0–36.0)
MCV: 94.3 fL (ref 80.0–100.0)
Platelets: 134 10*3/uL — ABNORMAL LOW (ref 150–400)
RBC: 4.7 MIL/uL (ref 3.87–5.11)
RDW: 17.1 % — ABNORMAL HIGH (ref 11.5–15.5)
WBC: 10.9 10*3/uL — ABNORMAL HIGH (ref 4.0–10.5)
nRBC: 0 % (ref 0.0–0.2)

## 2022-08-26 LAB — GLUCOSE, CAPILLARY
Glucose-Capillary: 119 mg/dL — ABNORMAL HIGH (ref 70–99)
Glucose-Capillary: 86 mg/dL (ref 70–99)
Glucose-Capillary: 108 mg/dL — ABNORMAL HIGH (ref 70–99)
Glucose-Capillary: 137 mg/dL — ABNORMAL HIGH (ref 70–99)
Glucose-Capillary: 121 mg/dL — ABNORMAL HIGH (ref 70–99)

## 2022-08-26 LAB — TROPONIN I (HIGH SENSITIVITY)
Troponin I (High Sensitivity): 15 ng/L (ref <18)
Troponin I (High Sensitivity): 13 ng/L (ref <18)

## 2022-08-26 MED ORDER — SODIUM CHLORIDE 0.9 % IV SOLN: INTRAVENOUS | Status: DC

## 2022-08-26 MED ORDER — SODIUM CHLORIDE 0.9% FLUSH: 10.0000 mL | INTRAVENOUS | Status: DC | PRN

## 2022-08-26 MED ORDER — NOREPINEPHRINE 4 MG/250ML-% IV SOLN
2.0000 ug/min | INTRAVENOUS | Status: DC
  Administered 2022-08-26: 2 ug/min via INTRAVENOUS
  Administered 2022-08-26 – 2022-08-28 (×2): 3 ug/min via INTRAVENOUS
  Filled 2022-08-26 (×4): qty 250

## 2022-08-26 MED ORDER — SODIUM CHLORIDE 0.9 % IV BOLUS
1000.0000 mL | Freq: Once | INTRAVENOUS | Status: AC
  Administered 2022-08-26: 1000 mL via INTRAVENOUS

## 2022-08-26 MED ORDER — CHLORHEXIDINE GLUCONATE CLOTH 2 % EX PADS
6.0000 | MEDICATED_PAD | Freq: Every day | CUTANEOUS | Status: DC
  Administered 2022-08-26 – 2022-08-27 (×2): 6 via TOPICAL

## 2022-08-26 MED ORDER — SODIUM CHLORIDE 0.9 % IV BOLUS
500.0000 mL | Freq: Once | INTRAVENOUS | Status: AC
  Administered 2022-08-26: 500 mL via INTRAVENOUS

## 2022-08-26 MED ORDER — SODIUM CHLORIDE 0.9 % IV SOLN
250.0000 mL | INTRAVENOUS | Status: DC
  Administered 2022-08-26: 250 mL via INTRAVENOUS

## 2022-08-26 MED ORDER — SPIRONOLACTONE 12.5 MG HALF TABLET: 12.5000 mg | ORAL_TABLET | Freq: Every day | ORAL | Status: DC

## 2022-08-26 MED ORDER — SODIUM CHLORIDE 0.9% FLUSH
10.0000 mL | Freq: Two times a day (BID) | INTRAVENOUS | Status: DC
  Administered 2022-08-26: 30 mL
  Administered 2022-08-27 – 2022-08-28 (×2): 10 mL

## 2022-08-26 NOTE — Plan of Care (Signed)

## 2022-08-26 NOTE — Progress Notes (Signed)
Peripherally Inserted Central Catheter Placement  The IV Nurse has discussed with the patient and/or persons authorized to consent for the patient, the purpose of this procedure and the potential benefits and risks involved with this procedure.  The benefits include less needle sticks, lab draws from the catheter, and the patient may be discharged home with the catheter. Risks include, but not limited to, infection, bleeding, blood clot (thrombus formation), and puncture of an artery; nerve damage and irregular heartbeat and possibility to perform a PICC exchange if needed/ordered by physician.  Alternatives to this procedure were also discussed.  Bard Power PICC patient education guide, fact sheet on infection prevention and patient information card has been provided to patient /or left at bedside.  Consent signed by daughter due to altered mental status.  PICC Placement Documentation  PICC Triple Lumen 08/26/22 Left Basilic 45 cm 0 cm (Active)  Indication for Insertion or Continuance of Line Vasoactive infusions 08/26/22 2053  Exposed Catheter (cm) 0 cm 08/26/22 2053  Site Assessment Clean, Dry, Intact 08/26/22 2053  Lumen #1 Status Flushed;Saline locked;Blood return noted 08/26/22 2053  Lumen #2 Status Flushed;Saline locked;Blood return noted 08/26/22 2053  Lumen #3 Status Flushed;Saline locked;Blood return noted 08/26/22 2053  Dressing Type Transparent;Securing device 08/26/22 2053  Dressing Status Antimicrobial disc in place;Clean, Dry, Intact 08/26/22 2053  Line Care Connections checked and tightened 08/26/22 2053  Line Adjustment (NICU/IV Team Only) No 08/26/22 2053  Dressing Intervention New dressing 08/26/22 2053  Dressing Change Due 09/02/22 08/26/22 2053       Carleigh Buccieri, Lajean Manes 08/26/2022, 8:54 PM

## 2022-08-26 NOTE — Consult Note (Signed)
NAME:  Hannah [name], MRN:  [id number], DOB:  Jul 24, 1940, LOS: 2 ADMISSION DATE:  08/24/2022, CONSULTATION DATE: 08/26/2022 REFERRING MD: [facility], CHIEF COMPLAINT: Cervical fracture hypotension  History of Present Illness:  82 year old female [place] extensive past medical history is well-documented below who had a fall with a C1-2 fracture and a small subarachnoid hemorrhage bilateral extremity weakness for which she was admitted to [facility].  She has been evaluated by neurology and neurosurgery.  Of note she had a pulmonary embolism in the past which led to a PEA arrest currently she is on Coumadin 2.5 mg daily which has been reversed pulmonary critical care was called to the bedside 08/26/2022 for refractory hypotension.  On exam she will be transferred to the intensive care unit we have requested 4 [place]. She may need a femoral central line and a femoral arterial line placed in the near future to actually maintain blood pressure readings and to supply her with fluids and vasopressors.  Pertinent  Medical History   Past Medical History:  Diagnosis Date   Acute massive pulmonary embolism (HCC) 07/13/2012   Massive PE w/ PEA arrest 07/13/12 >TNK >IVC filter >discharged on comadin     Anticoagulated on warfarin    Chronic diastolic CHF (congestive heart failure) (HCC) 03/15/2017   Chronic kidney disease, stage 3a (HCC) 12/15/2018   Diabetes mellitus, type 2 (HCC) 07/15/2012   with peripheral neuropathy   DM2 (diabetes mellitus, type 2) (HCC) 07/23/2012   HFrEF (heart failure with reduced ejection fraction) (HCC) 03/15/2017   Non-ischemic cardiomyopathy // Cardiac catheterization 01/01/21:  RCA prox to mid 20 // Echocardiogram 12/26/20:  EF 30-35, ant septum/apex, mid inf-sept AK; ant-lat/ant/inf HK, Gr 1 DD, mild LVH, normal RVSF, RVSP 27.2, small effusion (ant to RV), mild MR, mod TR, AV sclerosis w/o AS   Hyperlipemia 07/14/2012   Hyperlipidemia 02/25/2013   Hypertension 07/14/2012   Hypothyroid  06/06/2013   Large bowel stricture (HCC)    s/p colectomy in 2016   Neuropathy 02/12/2015   Osteoarthritis    s/p hip and knee replacements   PAF (paroxysmal atrial fibrillation) (HCC)    Pulmonary hypertension (HCC) 07/26/2016   Syncope 10/2018   Thyroid disease      Significant Hospital Events: Including procedures, antibiotic start and stop dates in addition to other pertinent events   08/26/2022 transferred to intensive care unit  Interim History / Subjective:  Refractory hypotension  Objective   Blood pressure (!) 81/52, pulse 72, temperature (!) 97.5 F (36.4 C), temperature source Oral, resp. rate 10, height 5\' 7"  (1.702 m), weight 81.5 kg, SpO2 (!) 88%.        Intake/Output Summary (Last 24 hours) at 08/26/2022 0940 Last data filed at 08/26/2022 0700 Gross per 24 hour  Intake 620.53 ml  Output 700 ml  Net -79.47 ml   Filed Weights   08/24/22 2025 08/26/22 0500  Weight: 87.5 kg 81.5 kg    Examination: General: Elderly female with a [place] collar in place HENT: [place] J cervical collar in place Lungs: Decreased air movement Cardiovascular: Heart sounds are regular with a rate of 70 Abdomen: +bs Extremities: Lower extremity strength Neuro: Weak movement bilaterally extremities   Resolved Hospital Problem list     Assessment & Plan:  Hypotension in the setting of C1-2 fracture trace subarachnoid hemorrhage right frontal lobe from fall, the patient has a history of PEA arrest from pulmonary embolism and now has refractory hypotension. Transfer to the intensive care unit 4N has been requested.  Fluid resuscitation as tolerated Peripheral Levophed She may need femoral central line and arterial line Cervical collar remains in place Neurosurgery is following for possible surgical intervention  Chronic kidney disease Lab Results  Component Value Date   CREATININE 1.51 (H) 08/26/2022   CREATININE 1.12 (H) 08/25/2022   CREATININE 0.90 08/24/2022   CREATININE 1.32  (H) 12/03/2019   CREATININE 1.31 (H) 10/30/2019   Avoid nephrotoxins  Diabetes mellitus type 2 Sign scale insulin protocol  History of paroxysmal atrial fibrillation but now off anticoagulation despite her history of PE No anticoagulation  Hypothyroidism Synthroid  Best Practice (right click and "Reselect all SmartList Selections" daily)   Diet/type: NPO DVT prophylaxis: not indicated GI prophylaxis: PPI Lines: N/A Foley:  N/A Code Status:  full code Last date of multidisciplinary goals of care discussion [tbd]  Labs   CBC: Recent Labs  Lab 08/24/22 2028 08/24/22 2030 08/25/22 0012 08/26/22 0703  WBC 8.8  --  11.9* 10.9*  NEUTROABS  --   --  9.9*  --   HGB 14.5 16.3* 14.5 13.5  HCT 46.7* 48.0* 46.4* 44.3  MCV 95.1  --  94.7 94.3  PLT 156  --  146* 134*    Basic Metabolic Panel: Recent Labs  Lab 08/24/22 2028 08/24/22 2030 08/25/22 0012 08/26/22 0703  NA 134* 134* 135 135  K 3.6 3.6 3.9 4.3  CL 101 102 103 107  CO2 15*  --  17* 17*  GLUCOSE 107* 109* 119* 117*  BUN 18 19 17  24*  CREATININE 1.36* 0.90 1.12* 1.51*  CALCIUM 8.7*  --  8.2* 8.3*  MG  --   --  1.6*  --    GFR: Estimated Creatinine Clearance: 32.1 mL/min (A) (by C-G formula based on SCr of 1.51 mg/dL (H)). Recent Labs  Lab 08/24/22 2028 08/24/22 2031 08/25/22 0012 08/25/22 0507 08/26/22 0703  WBC 8.8  --  11.9*  --  10.9*  LATICACIDVEN  --  2.2*  --  1.4  --     Liver Function Tests: Recent Labs  Lab 08/24/22 2028 08/25/22 0012  AST 18 18  ALT 12 14  ALKPHOS 50 48  BILITOT 1.4* 1.8*  PROT 5.9* 5.6*  ALBUMIN 2.8* 2.8*   No results for input(s): "LIPASE", "AMYLASE" in the last 168 hours. No results for input(s): "AMMONIA" in the last 168 hours.  ABG    Component Value Date/Time   PHART 7.343 (L) 12/30/2020 1655   PCO2ART 34.4 12/30/2020 1655   PO2ART 80 (L) 12/30/2020 1655   HCO3 18.7 (L) 12/30/2020 1655   TCO2 18 (L) 08/24/2022 2030   ACIDBASEDEF 6.0 (H) 12/30/2020  1655   O2SAT 95.0 12/30/2020 1655     Coagulation Profile: Recent Labs  Lab 08/19/22 1505 08/24/22 2028 08/25/22 0012 08/25/22 0507 08/26/22 0703  INR 1.5* 3.5* 1.1 1.1 >10.0*    Cardiac Enzymes: No results for input(s): "CKTOTAL", "CKMB", "CKMBINDEX", "TROPONINI" in the last 168 hours.  HbA1C: Hgb A1c MFr Bld  Date/Time Value Ref Range Status  08/25/2022 12:12 AM 6.0 (H) 4.8 - 5.6 % Final    Comment:    (NOTE) Pre diabetes:          5.7%-6.4%  Diabetes:              >6.4%  Glycemic control for   <7.0% adults with diabetes   04/25/2022 05:49 AM 7.2 (H) 4.8 - 5.6 % Final    Comment:    (NOTE) Pre diabetes:  5.7%-6.4%  Diabetes:              >6.4%  Glycemic control for   <7.0% adults with diabetes     CBG: Recent Labs  Lab 08/25/22 0741 08/25/22 1127 08/25/22 1822 08/25/22 2115 08/26/22 0730  GLUCAP 85 86 121* 146* 119*    Review of Systems:   10 point review of system taken, please see HPI for positives and negatives.   Past Medical History:  She,  has a past medical history of Acute massive pulmonary embolism (HCC) (07/13/2012), Anticoagulated on warfarin, Chronic diastolic CHF (congestive heart failure) (HCC) (03/15/2017), Chronic kidney disease, stage 3a (HCC) (12/15/2018), Diabetes mellitus, type 2 (HCC) (07/15/2012), DM2 (diabetes mellitus, type 2) (HCC) (07/23/2012), HFrEF (heart failure with reduced ejection fraction) (HCC) (03/15/2017), Hyperlipemia (07/14/2012), Hyperlipidemia (02/25/2013), Hypertension (07/14/2012), Hypothyroid (06/06/2013), Large bowel stricture (HCC), Neuropathy (02/12/2015), Osteoarthritis, PAF (paroxysmal atrial fibrillation) (HCC), Pulmonary hypertension (HCC) (07/26/2016), Syncope (10/2018), and Thyroid disease.   Surgical History:   Past Surgical History:  Procedure Laterality Date   COLONOSCOPY N/A 03/12/2016   Procedure: COLONOSCOPY;  Surgeon: Dorena Cookey, MD;  Location: [facility] ENDOSCOPY;  Service: Endoscopy;  Laterality: N/A;    COLOSTOMY N/A 06/03/2014   Procedure: COLOSTOMY;  Surgeon: Harriette Bouillon, MD;  Location: [facility] OR;  Service: General;  Laterality: N/A;   FLEXIBLE SIGMOIDOSCOPY N/A 05/30/2014   Procedure: Arnell Sieving;  Surgeon: Jeani Hawking, MD;  Location: [facility] ENDOSCOPY;  Service: Endoscopy;  Laterality: N/A;   INSERTION OF VENA CAVA FILTER N/A 07/16/2012   Procedure: INSERTION OF VENA CAVA FILTER;  Surgeon: [name], MD;  Location: [facility] CATH LAB;  Service: Cardiovascular;  Laterality: N/A;   KNEE ARTHROSCOPY     LEFT HEART CATH AND CORONARY ANGIOGRAPHY N/A 01/01/2021   Procedure: LEFT HEART CATH AND CORONARY ANGIOGRAPHY;  Surgeon: Kathleene Hazel, MD;  Location: [facility] INVASIVE CV LAB;  Service: Cardiovascular;  Laterality: N/A;   LEFT HEART CATH AND CORONARY ANGIOGRAPHY N/A 04/29/2022   Procedure: LEFT HEART CATH AND CORONARY ANGIOGRAPHY;  Surgeon: Orbie Pyo, MD;  Location: [facility] INVASIVE CV LAB;  Service: Cardiovascular;  Laterality: N/A;   PARTIAL COLECTOMY N/A 06/03/2014   Procedure: PARTIAL COLECTOMY;  Surgeon: Harriette Bouillon, MD;  Location: [facility] OR;  Service: General;  Laterality: N/A;   REDUCTION MAMMAPLASTY Bilateral    RIGHT HEART CATH N/A 12/30/2020   Procedure: RIGHT HEART CATH;  Surgeon: Kathleene Hazel, MD;  Location: [facility] INVASIVE CV LAB;  Service: Cardiovascular;  Laterality: N/A;   SP ARTHRO HIP*L*       Social History:   reports that she quit smoking about 54 years ago. Her smoking use included cigarettes. She started smoking about 64 years ago. She has a 10 pack-year smoking history. She has never used smokeless tobacco. She reports that she does not drink alcohol and does not use drugs.   Family History:  Her family history includes Breast cancer in her mother.   Allergies Allergies  Allergen Reactions   Augmentin [Amoxicillin-Pot Clavulanate] Itching and Other (See Comments)    Severe vaginal itching   Tranxene [Clorazepate] Itching   Crestor [Rosuvastatin]      Made her sick on stomach, she is able to tolerate the zocor   Penicillins Itching and Rash     Home Medications  Prior to Admission medications   Medication Sig Start Date End Date Taking? Authorizing Provider  acetaminophen (TYLENOL) 500 MG tablet Take 1,000 mg by mouth every 6 (six) hours as needed for mild  pain.   Yes [provider]  atorvastatin (LIPITOR) 40 MG tablet Take 1 tablet (40 mg total) by mouth daily. 03/03/22  Yes [name], MD  dapagliflozin propanediol (FARXIGA) 10 MG TABS tablet Take 1 tablet (10 mg total) by mouth daily before breakfast. 12/23/21  Yes Pricilla Riffle, MD  diclofenac Sodium (VOLTAREN ARTHRITIS PAIN) 1 % GEL Apply 2 g topically as needed.   Yes [provider]  DULoxetine (CYMBALTA) 30 MG capsule Take 1 capsule (30 mg total) by mouth daily. 07/25/22  Yes [name], MD  furosemide (LASIX) 20 MG tablet Take 1 tablet (20 mg total) by mouth daily as needed for fluid or edema. 03/10/21  Yes Koberlein, Paris Lore, MD  levothyroxine (SYNTHROID) 25 MCG tablet Take 25 mcg by mouth daily before breakfast.   Yes [provider]  metoprolol succinate (TOPROL XL) 25 MG 24 hr tablet Take 1 tablet (25 mg total) by mouth daily. 06/08/22 06/08/23 Yes Clegg, Amy D, NP  nitroGLYCERIN (NITROSTAT) 0.4 MG SL tablet Place 1 tablet (0.4 mg total) under the tongue every 5 (five) minutes as needed for chest pain. 04/30/22  Yes Willeen Niece, MD  pantoprazole (PROTONIX) 40 MG tablet Take 1 tablet (40 mg total) by mouth daily. 02/26/21  Yes Koberlein, Paris Lore, MD  sacubitril-valsartan (ENTRESTO) 24-26 MG Take 1 tablet by mouth 2 (two) times daily. 12/14/21  Yes [name], MD  spironolactone (ALDACTONE) 25 MG tablet Take 0.5 tablets (12.5 mg total) by mouth daily. 06/27/22  Yes Andrey Farmer, PA-C  vitamin B-12 (CYANOCOBALAMIN) 1000 MCG tablet Take 1 tablet (1,000 mcg total) by mouth daily. 05/15/20  Yes Koberlein, Paris Lore, MD  vitamin  C (ASCORBIC ACID) 500 MG tablet Take 500 mg by mouth daily.    Yes [provider]  warfarin (COUMADIN) 2.5 MG tablet Take 1 tablet (2.5 mg total) by mouth daily. 06/29/22  Yes Pricilla Riffle, MD  glucose blood [facility] VERIO) test strip USE 1 STRIP TO CHECK GLUCOSE ONCE DAILY AS DIRECTED 05/13/22   [name], MD  OneTouch Delica Lancets 30G MISC USE 1  TO CHECK GLUCOSE ONCE DAILY 05/13/22   [name], MD     Critical care time: 25 min    Brett Canales Arminda Foglio ACNP Acute Care Nurse Practitioner [facility] Pulmonary/Critical Care Please consult Amion 08/26/2022, 9:40 AM

## 2022-08-26 NOTE — Progress Notes (Signed)
   08/26/22 0553  Assess: MEWS Score  BP (!) 86/51  MAP (mmHg) (!) 62  Pulse Rate 72  ECG Heart Rate 73  Resp (!) 0  SpO2 94 %  Assess: MEWS Score  MEWS Temp 0  MEWS Systolic 1  MEWS Pulse 0  MEWS RR 2  MEWS LOC 0  MEWS Score 3  MEWS Score Color Yellow  Assess: if the MEWS score is Yellow or Red  Were vital signs accurate and taken at a resting state? Yes  Does the patient meet 2 or more of the SIRS criteria? No  MEWS guidelines implemented  Yes, yellow  Treat  MEWS Interventions Considered administering scheduled or prn medications/treatments as ordered  Take Vital Signs  Increase Vital Sign Frequency  Yellow: Q2hr x1, continue Q4hrs until patient remains green for 12hrs  Escalate  MEWS: Escalate Yellow: Discuss with charge nurse and consider notifying provider and/or RRT  Notify: Charge Nurse/RN  Name of Charge Nurse/RN Notified Liborio Nixon, RN  Provider Notification  Provider Name/Title Opyd, MD  Date Provider Notified 08/26/22  Time Provider Notified [phone]  Method of Notification  (secured chat)  Notification Reason Other (Comment) (new yellow MEWS/hypotension)  Provider response See new orders  Date of Provider Response 08/26/22  Time of Provider Response 0556  Assess: SIRS CRITERIA  SIRS Temperature  0  SIRS Pulse 0  SIRS Respirations  0  SIRS WBC 0  SIRS Score Sum  0   Pt hypotensive, no urinary output since 0100 on 08/26/22, bladder scan showed 495 ml of urine.  Opyd, MD aware, new orders placed.  Pt in acute distress, no change in mental status.

## 2022-08-26 NOTE — Progress Notes (Signed)
Echocardiogram 2D Echocardiogram has been performed.  Hannah [name] 08/26/2022, 3:50 PM

## 2022-08-26 NOTE — Progress Notes (Addendum)
PROGRESS NOTE        PATIENT DETAILS Name: Hannah [name] Age: 82 y.o. Sex: female Date of Birth: 12-Feb-1940 Admit Date: 08/24/2022 Admitting Physician Angie Fava, DO ZOX:[id number], Vinetta Bergamo, MD  Brief Summary: Patient is a 82 y.o.  female with history of A-fib-on Coumadin, massive pulmonary embolism-causing PEA arrest-requiring tPA, HFpEF, DM-2, CKD stage IIIa who presented to the hospital following a mechanical fall-patient was found to have acute C1 and type II odontoid fracture with subsequent severe spinal stenosis/cord compression.  Neurosurgery consultation was obtained-hard collar placed-recommendations were for supportive care with PT/OT and eventual decompressive surgery in the coming weeks.  Significant events: 8/21>> mechanical fall-C1/C2 fracture-BP low responded to IV fluids-Coumadin reversed with Kcentra-admit to [facility].   8/22>> neurosurgical consultation-hard collar-surgery in the next week or so. 8/23>> urinary retention requiring I/O overnight-persistently hypotensive-in spite of IV fluid-mild AKI-PCCM consultation.  Significant studies: 8/21>> CXR: No PNA 8/21>> x-ray pelvis portable: No fracture/dislocation 8/21>> CT head: Trace SAH right frontal lobe 8/21>> CT C-spine: C1/C2 fracture. 8/22>> CT head: Prior seen SAH not demonstrated 8/22>> MRI brain: Acute C1/C2 fracture-superimposed on bulky C-spine degeneration-subsequent moderate/severe spinal stenosis, cord compression, spinal cord abnormality C3-C6-  Significant microbiology data: None  Procedures: None  Consults: Neurosurgery PCCM  Subjective: In a hard collar-awake/alert-BP in the 70s systolic-received 500 cc IV fluid boluses earlier this morning and was on IV fluid overnight.  Objective: Vitals: Blood pressure (!) 81/52, pulse 72, temperature (!) 97.5 F (36.4 C), temperature source Oral, resp. rate 10, height 5\' 7"  (1.702 m), weight 81.5 kg, SpO2 (!) 88%.    Exam: Gen Exam:not in any distress HEENT:atraumatic, normocephalic Chest: B/L clear to auscultation anteriorly CVS:S1S2 regular Abdomen:soft non tender, non distended Extremities:no edema Neurology: Generalized weakness but but does appear with significant weakness in upper extremity/bilateral hands. Skin: no rash  Pertinent Labs/Radiology:    Latest Ref Rng & Units 08/26/2022    7:03 AM 08/25/2022   12:12 AM 08/24/2022    8:30 PM  CBC  WBC 4.0 - 10.5 K/uL 10.9  11.9    Hemoglobin 12.0 - 15.0 g/dL 09.8  11.9  14.7   Hematocrit 36.0 - 46.0 % 44.3  46.4  48.0   Platelets 150 - 400 K/uL 134  146      Lab Results  Component Value Date   NA 135 08/26/2022   K 4.3 08/26/2022   CL 107 08/26/2022   CO2 17 (L) 08/26/2022      Assessment/Plan: Hypotension/shock Unclear etiology-Hb stable-doubt blood loss-afebrile-no evidence of sepsis, concerned that this may be neurogenic.  Repeat IV fluid bolus-echo to check EF-and PCCM consult.    Mechanical fall with C1/C2 fracture with cord compression/central cord syndrome-superimposed on chronic cervical stenosis Per neurosurgery-her exam is unchanged from yesterday.  However she has developed some urinary retention overnight requiring in/out catheter and now is hypotensive.  Worried that she may have neurogenic shock.  Continue hard collar-neurosurgery recommending surgery in the coming weeks.  Given change in clinical condition-will discuss further with Dr. Lovell Sheehan. PT/OT when more hemodynamically stable  Addendum Discussed with Dr. Robbie Louis exam is unchanged-doing surgery acutely is not going to change outcome, he suggested that we rule out other causes-like cardiogenic shock (echo/troponins ordered).  He is planning to do decompressive surgery on Wednesday.   AKI on CKD stage IIIa Likely due to hypotension/Entresto use/urinary  retention IV fluid boluses Hold Entresto Suspect will require a Foley catheter-check bladder scans  frequently for urinary retention.  PAF Sinus rhythm Coumadin reversed on admission with Kcentra and 10 mg of vitamin K INR> 10 today-suspect lab error-repeat INR today.  History of massive PE causing PEA arrest-and requiring tPA in 2014 Coumadin on hold Has IVC filter in place per CXR report.  HFpEF Euvolemic Entresto/metoprolol/Aldactone on hold due to hypotension  HLD Statin  Hypothyroidism Synthroid TSH 8/22 stable.  BMI: Estimated body mass index is 28.14 kg/m as calculated from the following:   Height as of this encounter: 5\' 7"  (1.702 m).   Weight as of this encounter: 81.5 kg.   Code status:   Code Status: Full Code   DVT Prophylaxis: SCDs Start: 08/24/22 2331   Family Communication: None at bedside   Disposition Plan: Status is: Inpatient Remains inpatient appropriate because: Severity of illness   Planned Discharge Destination:Skilled nursing facility   Diet: Diet Order             Diet regular Room service appropriate? Yes; Fluid consistency: Thin  Diet effective now                     Antimicrobial agents: Anti-infectives (From admission, onward)    None        MEDICATIONS: Scheduled Meds:  atorvastatin  40 mg Oral Daily   DULoxetine  30 mg Oral Daily   insulin aspart  0-6 Units Subcutaneous TID WC   levothyroxine  25 mcg Oral QAC breakfast   pantoprazole  40 mg Oral Daily   Continuous Infusions:  sodium chloride 75 mL/hr at 08/26/22 0123   PRN Meds:.acetaminophen **OR** acetaminophen, hydrALAZINE, HYDROmorphone (DILAUDID) injection, melatonin, nitroGLYCERIN, ondansetron (ZOFRAN) IV   I have personally reviewed following labs and imaging studies  LABORATORY DATA: CBC: Recent Labs  Lab 08/24/22 2028 08/24/22 2030 08/25/22 0012 08/26/22 0703  WBC 8.8  --  11.9* 10.9*  NEUTROABS  --   --  9.9*  --   HGB 14.5 16.3* 14.5 13.5  HCT 46.7* 48.0* 46.4* 44.3  MCV 95.1  --  94.7 94.3  PLT 156  --  146* 134*    Basic  Metabolic Panel: Recent Labs  Lab 08/24/22 2028 08/24/22 2030 08/25/22 0012 08/26/22 0703  NA 134* 134* 135 135  K 3.6 3.6 3.9 4.3  CL 101 102 103 107  CO2 15*  --  17* 17*  GLUCOSE 107* 109* 119* 117*  BUN 18 19 17  24*  CREATININE 1.36* 0.90 1.12* 1.51*  CALCIUM 8.7*  --  8.2* 8.3*  MG  --   --  1.6*  --     GFR: Estimated Creatinine Clearance: 32.1 mL/min (A) (by C-G formula based on SCr of 1.51 mg/dL (H)).  Liver Function Tests: Recent Labs  Lab 08/24/22 2028 08/25/22 0012  AST 18 18  ALT 12 14  ALKPHOS 50 48  BILITOT 1.4* 1.8*  PROT 5.9* 5.6*  ALBUMIN 2.8* 2.8*   No results for input(s): "LIPASE", "AMYLASE" in the last 168 hours. No results for input(s): "AMMONIA" in the last 168 hours.  Coagulation Profile: Recent Labs  Lab 08/19/22 1505 08/24/22 2028 08/25/22 0012 08/25/22 0507 08/26/22 0703  INR 1.5* 3.5* 1.1 1.1 >10.0*    Cardiac Enzymes: No results for input(s): "CKTOTAL", "CKMB", "CKMBINDEX", "TROPONINI" in the last 168 hours.  BNP (last 3 results) No results for input(s): "PROBNP" in the last 8760 hours.  Lipid Profile: No  results for input(s): "CHOL", "HDL", "LDLCALC", "TRIG", "CHOLHDL", "LDLDIRECT" in the last 72 hours.  Thyroid Function Tests: Recent Labs    08/25/22 0012  TSH 1.993    Anemia Panel: No results for input(s): "VITAMINB12", "FOLATE", "FERRITIN", "TIBC", "IRON", "RETICCTPCT" in the last 72 hours.  Urine analysis:    Component Value Date/Time   COLORURINE YELLOW 07/22/2022 0003   APPEARANCEUR HAZY (A) 07/22/2022 0003   LABSPEC 1.015 07/22/2022 0003   PHURINE 5.0 07/22/2022 0003   GLUCOSEU >=500 (A) 07/22/2022 0003   HGBUR MODERATE (A) 07/22/2022 0003   BILIRUBINUR NEGATIVE 07/22/2022 0003   KETONESUR 80 (A) 07/22/2022 0003   PROTEINUR 30 (A) 07/22/2022 0003   UROBILINOGEN 0.2 03/14/2017 1102   NITRITE NEGATIVE 07/22/2022 0003   LEUKOCYTESUR NEGATIVE 07/22/2022 0003    Sepsis Labs: Lactic Acid, Venous     Component Value Date/Time   LATICACIDVEN 1.4 08/25/2022 0507    MICROBIOLOGY: No results found for this or any previous visit (from the past 240 hour(s)).  RADIOLOGY STUDIES/RESULTS: MR Cervical Spine Wo Contrast  Addendum Date: 08/25/2022   ADDENDUM REPORT: 08/25/2022 05:23 ADDENDUM: Study discussed by telephone with Hospitalist Dr. Arlean Hopping on 08/25/2022 at 05:22. He advised that Neurosurgery is already engaged and he will make them aware of this report. Electronically Signed   By: Odessa Fleming M.D.   On: 08/25/2022 05:23   Result Date: 08/25/2022 CLINICAL DATA:  82 year old female status post fall on blood thinners, bilateral C1 ring and type 2 odontoid fracture with posterior displacement. Ossification of the posterior longitudinal ligament (OPLL). EXAM: MRI CERVICAL SPINE WITHOUT CONTRAST TECHNIQUE: Multiplanar, multisequence MR imaging of the cervical spine was performed. No intravenous contrast was administered. COMPARISON:  Cervical spine CT yesterday FINDINGS: Alignment: Stable with straightening of cervical lordosis. Fracture alignment appears stable. Craniocervical alignment of the occipital condyles and C1 lateral masses appears maintained. Vertebrae: Marrow edema at the C1 and odontoid corresponding to known fractures, and curvilinear type 2 odontoid fracture plain is visible on sagittal T1 and T2 (series 5, image 8). Fracture alignment appears stable. Normal background bone marrow signal. Widespread and somewhat bulky vertebral endplate spurring throughout. Cord: Abnormal. Diffuse mass effect on the cervical spinal cord appears in large part related to bulky degenerative disease and Ossification of the posterior longitudinal ligament (OPLL). But heterogeneous abnormal T2 spinal cord signal at the C4 and C5 levels is indeterminate for edema versus cord contusion versus myelomalacia. The abnormal cord signal continues through the C5-C6 disc level. Above the C3-C4 disc, a small posterior column  area of T2 hyperintensity on series 8, image 18 is also noted. On axial T2 * imaging areas of susceptibility seemingly in the canal (such as series 10 image 21 on the left at C3-C4) appear to correspond to exophytic endplate or PLL ossification by CT. No convincing spinal cord hemorrhage. Posterior Fossa, vertebral arteries, paraspinal tissues: Abnormal prevertebral fluid or edema from C1 through C6 suspicious for superimposed anterior longitudinal ligament injury. No inter spinous ligament signal abnormality. Tectorial membrane appears intact. Cervicomedullary junction is within normal limits. Grossly negative visible posterior fossa. Preserved major vascular flow voids in the neck. Partially retropharyngeal course of the carotids. Tortuous and codominant appearing cervical vertebral arteries. Negative lung apices. Disc levels: C1-C2: Acute fractures, better detailed on CT. C2-C3: Disc, endplate and posterior element degeneration with no spinal stenosis. C3-C4: Severe disc space loss with bulky circumferential disc osteophyte complex and C4 OPLL. Moderate to severe spinal stenosis, spinal cord mass effect, and abnormal cord signal.  Moderate to severe bilateral foraminal stenosis. C4-C5: Severe disc space loss with bulky disc osteophyte complex, C4 and C5 OPLL. Moderate to severe spinal stenosis and spinal cord mass effect plus cord signal abnormality. Moderate to severe bilateral foraminal stenosis. C5-C6: Similar bulky disc osteophyte complex and OPLL. Moderate spinal stenosis and spinal cord mass effect. Moderate to severe bilateral foraminal stenosis. C6-C7: Bulky disc osteophyte complex. C6 OPLL. Mild spinal stenosis. Spinal cord mass effect and signal abnormality abate at this level. Mild to moderate bilateral foraminal stenosis. C7-T1:  Negative disc.  Mild facet hypertrophy. No visible upper thoracic stenosis. IMPRESSION: 1. Acute C1 and type 2 odontoid fractures superimposed on bulky cervical spine  degeneration and Ossification of the posterior longitudinal ligament (OPLL). Fracture alignment appears stable from the CT yesterday. Evidence of associated anterior longitudinal ligament injury. No posterior ligamentous injury identified. 2. Subsequent multifactorial moderate and severe spinal stenosis, cord compression, and spinal cord signal abnormality C3 through C6. The spinal cord abnormality is indeterminate for posttraumatic cord contusion (nonhemorrhagic) versus cord edema versus myelomalacia. 3. Associated moderate to severe cervical foraminal stenosis from C3 through C6. Electronically Signed: By: Odessa Fleming M.D. On: 08/25/2022 05:06   CT Head Wo Contrast  Result Date: 08/25/2022 CLINICAL DATA:  Subarachnoid hemorrhage. EXAM: CT HEAD WITHOUT CONTRAST TECHNIQUE: Contiguous axial images were obtained from the base of the skull through the vertex without intravenous contrast. RADIATION DOSE REDUCTION: This exam was performed according to the departmental dose-optimization program which includes automated exposure control, adjustment of the mA and/or kV according to patient size and/or use of iterative reconstruction technique. COMPARISON:  08/24/2022 FINDINGS: Brain: Previously noted area of possible subarachnoid hemorrhage along the right frontal lobe is no longer seen; this was most likely related to beam hardening artifact on the prior exam. No evidence of acute infarction, hemorrhage, mass, mass effect, or midline shift. No hydrocephalus or extra-axial fluid collection. Vascular: No hyperdense vessel. Skull: Negative for fracture or focal lesion. Redemonstrated frontal scalp hematoma, with increased right greater than left periorbital component. Sinuses/Orbits: Mucous retention cyst in the right maxillary sinus. Otherwise clear paranasal sinuses. No acute finding in the orbits. Other: The mastoid air cells are well aerated. IMPRESSION: 1. Previously noted area of possible subarachnoid hemorrhage along  the right frontal lobe is no longer seen, most likely related to beam hardening artifact on the prior exam. No additional acute intracranial process. 2. Redemonstrated frontal scalp hematoma, with increased right greater than left periorbital component. Electronically Signed   By: Wiliam Ke M.D.   On: 08/25/2022 03:23   CT HEAD WO CONTRAST  Result Date: 08/24/2022 CLINICAL DATA:  Fall, on blood thinners EXAM: CT HEAD WITHOUT CONTRAST CT CERVICAL SPINE WITHOUT CONTRAST TECHNIQUE: Multidetector CT imaging of the head and cervical spine was performed following the standard protocol without intravenous contrast. Multiplanar CT image reconstructions of the cervical spine were also generated. RADIATION DOSE REDUCTION: This exam was performed according to the departmental dose-optimization program which includes automated exposure control, adjustment of the mA and/or kV according to patient size and/or use of iterative reconstruction technique. COMPARISON:  07/21/2022 CT head and cervical spine FINDINGS: CT HEAD FINDINGS Brain: Suspect trace subarachnoid hemorrhage along the right frontal lobe (series 2, image 22), although evaluation is somewhat limited by beam hardening artifact. No parenchymal hemorrhage. No evidence of acute infarct, mass, mass effect, or midline shift. No hydrocephalus or extra-axial fluid collection. Partial empty sella. Age related cerebral atrophy. Periventricular white matter changes, likely the sequela of chronic  small vessel ischemic disease. Vascular: No hyperdense vessel. Skull: Negative for fracture or focal lesion. Frontal scalp hematoma. Sinuses/Orbits: Small mucous retention cyst in the right maxillary sinus. Otherwise clear paranasal sinuses. No acute finding in the orbits. Other: The mastoid air cells are well aerated. CT CERVICAL SPINE FINDINGS Alignment: No traumatic listhesis. Skull base and vertebrae: Acute fracture through the base of the dens, extending into the posterior  body (type 2) (series 8, images 29-33), which is posteriorly displaced approximately 4 mm. Bilateral fractures through the anterior arch of C1 (series 5, image 23), with approximately 4 mm of displacement. The fractures do not appear to involve the vertebral artery foramina. Osteopenia. Soft tissues and spinal canal: Trace prevertebral fluid. Suspect a small amount of hemorrhage along the dorsal aspect of C2, without significant mass effect on the thecal sac. 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 Upper chest: Negative. IMPRESSION: 1. Suspect trace subarachnoid hemorrhage along the right frontal lobe, although evaluation is somewhat limited by beam hardening artifact. 2. Acute fracture through the base of the dens, extending into the posterior body (type 2) which is posteriorly displaced approximately 4 mm. 3. Bilateral fractures through the anterior arch of C1, with approximately 4 mm of displacement. 4. Suspect a small amount of hemorrhage along the dorsal aspect of C2, without significant mass effect on the thecal sac. This could be further evaluated with MRI. These results were called by telephone at the time of interpretation on 08/24/2022 at 9:21 pm to provider DAN FLOYD , who verbally acknowledged these results. Electronically Signed   By: Wiliam Ke M.D.   On: 08/24/2022 21:23   CT CERVICAL SPINE WO CONTRAST  Result Date: 08/24/2022 CLINICAL DATA:  Fall, on blood thinners EXAM: CT HEAD WITHOUT CONTRAST CT CERVICAL SPINE WITHOUT CONTRAST TECHNIQUE: Multidetector CT imaging of the head and cervical spine was performed following the standard protocol without intravenous contrast. Multiplanar CT image reconstructions of the cervical spine were also generated. RADIATION DOSE REDUCTION: This exam was performed  according to the departmental dose-optimization program which includes automated exposure control, adjustment of the mA and/or kV according to patient size and/or use of iterative reconstruction technique. COMPARISON:  07/21/2022 CT head and cervical spine FINDINGS: CT HEAD FINDINGS Brain: Suspect trace subarachnoid hemorrhage along the right frontal lobe (series 2, image 22), although evaluation is somewhat limited by beam hardening artifact. No parenchymal hemorrhage. No evidence of acute infarct, mass, mass effect, or midline shift. No hydrocephalus or extra-axial fluid collection. Partial empty sella. Age related cerebral atrophy. Periventricular white matter changes, likely the sequela of chronic small vessel ischemic disease. Vascular: No hyperdense vessel. Skull: Negative for fracture or focal lesion. Frontal scalp hematoma. Sinuses/Orbits: Small mucous retention cyst in the right maxillary sinus. Otherwise clear paranasal sinuses. No acute finding in the orbits. Other: The mastoid air cells are well aerated. CT CERVICAL SPINE FINDINGS Alignment: No traumatic listhesis. Skull base and vertebrae: Acute fracture through the base of the dens, extending into the posterior body (type 2) (series 8, images 29-33), which is posteriorly displaced approximately 4 mm. Bilateral fractures through the anterior arch of C1 (series 5, image 23), with approximately 4 mm of displacement. The fractures do not appear to involve the vertebral artery foramina. Osteopenia. Soft tissues and spinal canal: Trace prevertebral fluid. Suspect a small amount of hemorrhage along  the dorsal aspect of C2, without significant mass effect on the thecal sac. 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 Upper chest: Negative. IMPRESSION: 1. Suspect trace  subarachnoid hemorrhage along the right frontal lobe, although evaluation is somewhat limited by beam hardening artifact. 2. Acute fracture through the base of the dens, extending into the posterior body (type 2) which is posteriorly displaced approximately 4 mm. 3. Bilateral fractures through the anterior arch of C1, with approximately 4 mm of displacement. 4. Suspect a small amount of hemorrhage along the dorsal aspect of C2, without significant mass effect on the thecal sac. This could be further evaluated with MRI. These results were called by telephone at the time of interpretation on 08/24/2022 at 9:21 pm to provider DAN FLOYD , who verbally acknowledged these results. Electronically Signed   By: Wiliam Ke M.D.   On: 08/24/2022 21:23   DG Chest Port 1 View  Result Date: 08/24/2022 CLINICAL DATA:  Trauma EXAM: PORTABLE CHEST 1 VIEW COMPARISON:  CT of the chest 12/15/2018 FINDINGS: There is prominence of the right paratracheal region corresponding to ectatic vasculature, unchanged. Heart size is within normal limits. There is no focal lung infiltrate, pleural effusion or pneumothorax. No acute fractures are seen. IMPRESSION: No active disease. Electronically Signed   By: Darliss Cheney M.D.   On: 08/24/2022 20:55   DG Pelvis Portable  Result Date: 08/24/2022 CLINICAL DATA:  Level 2 trauma on blood thinners. EXAM: PORTABLE PELVIS 1-2 VIEWS COMPARISON:  07/21/2022 FINDINGS: Left THA. No radiographic evidence of loosening. No acute fracture or dislocation. IVC filter. IMPRESSION: No evidence of acute fracture or dislocation. Electronically Signed   By: Minerva Fester M.D.   On: 08/24/2022 20:55     LOS: 2 days   Jeoffrey Massed, MD  [facility]    To contact the attending provider between 7A-7P or the covering provider during after hours 7P-7A, please log into the web site [email or web address] and access using universal [place] password for that web site. If you do not have the password,  please call the hospital operator.  08/26/2022, 9:06 AM

## 2022-08-26 NOTE — Progress Notes (Signed)
Subjective: The patient is alert and pleasant.  She is in no apparent distress.  Objective: Vital signs in last 24 hours: Temp:  [96.4 F (35.8 C)-97.9 F (36.6 C)] 97.9 F (36.6 C) (08/23 0503) Pulse Rate:  [62-96] 72 (08/23 0553) Resp:  [0-19] 0 (08/23 0553) BP: (86-158)/(51-87) 86/51 (08/23 0553) SpO2:  [91 %-100 %] 94 % (08/23 0553) Weight:  [81.5 kg] 81.5 kg (08/23 0500) Estimated body mass index is 28.14 kg/m as calculated from the following:   Height as of this encounter: 5\' 7"  (1.702 m).   Weight as of this encounter: 81.5 kg.   Intake/Output from previous day: 08/22 0701 - 08/23 0700 In: 620.5 [I.V.:620.5] Out: 250 [Urine:200; Stool:50] Intake/Output this shift: No intake/output data recorded.  Physical exam the patient is alert and pleasant.  She continues to have 0/5 bilateral hand strength.  Otherwise she has diffuse mild weakness.  Lab Results: Recent Labs    08/24/22 2028 08/24/22 2030 08/25/22 0012  WBC 8.8  --  11.9*  HGB 14.5 16.3* 14.5  HCT 46.7* 48.0* 46.4*  PLT 156  --  146*   BMET Recent Labs    08/24/22 2028 08/24/22 2030 08/25/22 0012  NA 134* 134* 135  K 3.6 3.6 3.9  CL 101 102 103  CO2 15*  --  17*  GLUCOSE 107* 109* 119*  BUN 18 19 17   CREATININE 1.36* 0.90 1.12*  CALCIUM 8.7*  --  8.2*    Studies/Results: MR Cervical Spine Wo Contrast  Addendum Date: 08/25/2022   ADDENDUM REPORT: 08/25/2022 05:23 ADDENDUM: Study discussed by telephone with Hospitalist Dr. Arlean Hopping on 08/25/2022 at 05:22. He advised that Neurosurgery is already engaged and he will make them aware of this report. Electronically Signed   By: Odessa Fleming M.D.   On: 08/25/2022 05:23   Result Date: 08/25/2022 CLINICAL DATA:  82 year old female status post fall on blood thinners, bilateral C1 ring and type 2 odontoid fracture with posterior displacement. Ossification of the posterior longitudinal ligament (OPLL). EXAM: MRI CERVICAL SPINE WITHOUT CONTRAST TECHNIQUE:  Multiplanar, multisequence MR imaging of the cervical spine was performed. No intravenous contrast was administered. COMPARISON:  Cervical spine CT yesterday FINDINGS: Alignment: Stable with straightening of cervical lordosis. Fracture alignment appears stable. Craniocervical alignment of the occipital condyles and C1 lateral masses appears maintained. Vertebrae: Marrow edema at the C1 and odontoid corresponding to known fractures, and curvilinear type 2 odontoid fracture plain is visible on sagittal T1 and T2 (series 5, image 8). Fracture alignment appears stable. Normal background bone marrow signal. Widespread and somewhat bulky vertebral endplate spurring throughout. Cord: Abnormal. Diffuse mass effect on the cervical spinal cord appears in large part related to bulky degenerative disease and Ossification of the posterior longitudinal ligament (OPLL). But heterogeneous abnormal T2 spinal cord signal at the C4 and C5 levels is indeterminate for edema versus cord contusion versus myelomalacia. The abnormal cord signal continues through the C5-C6 disc level. Above the C3-C4 disc, a small posterior column area of T2 hyperintensity on series 8, image 18 is also noted. On axial T2 * imaging areas of susceptibility seemingly in the canal (such as series 10 image 21 on the left at C3-C4) appear to correspond to exophytic endplate or PLL ossification by CT. No convincing spinal cord hemorrhage. Posterior Fossa, vertebral arteries, paraspinal tissues: Abnormal prevertebral fluid or edema from C1 through C6 suspicious for superimposed anterior longitudinal ligament injury. No inter spinous ligament signal abnormality. Tectorial membrane appears intact. Cervicomedullary junction is within  normal limits. Grossly negative visible posterior fossa. Preserved major vascular flow voids in the neck. Partially retropharyngeal course of the carotids. Tortuous and codominant appearing cervical vertebral arteries. Negative lung  apices. Disc levels: C1-C2: Acute fractures, better detailed on CT. C2-C3: Disc, endplate and posterior element degeneration with no spinal stenosis. C3-C4: Severe disc space loss with bulky circumferential disc osteophyte complex and C4 OPLL. Moderate to severe spinal stenosis, spinal cord mass effect, and abnormal cord signal. Moderate to severe bilateral foraminal stenosis. C4-C5: Severe disc space loss with bulky disc osteophyte complex, C4 and C5 OPLL. Moderate to severe spinal stenosis and spinal cord mass effect plus cord signal abnormality. Moderate to severe bilateral foraminal stenosis. C5-C6: Similar bulky disc osteophyte complex and OPLL. Moderate spinal stenosis and spinal cord mass effect. Moderate to severe bilateral foraminal stenosis. C6-C7: Bulky disc osteophyte complex. C6 OPLL. Mild spinal stenosis. Spinal cord mass effect and signal abnormality abate at this level. Mild to moderate bilateral foraminal stenosis. C7-T1:  Negative disc.  Mild facet hypertrophy. No visible upper thoracic stenosis. IMPRESSION: 1. Acute C1 and type 2 odontoid fractures superimposed on bulky cervical spine degeneration and Ossification of the posterior longitudinal ligament (OPLL). Fracture alignment appears stable from the CT yesterday. Evidence of associated anterior longitudinal ligament injury. No posterior ligamentous injury identified. 2. Subsequent multifactorial moderate and severe spinal stenosis, cord compression, and spinal cord signal abnormality C3 through C6. The spinal cord abnormality is indeterminate for posttraumatic cord contusion (nonhemorrhagic) versus cord edema versus myelomalacia. 3. Associated moderate to severe cervical foraminal stenosis from C3 through C6. Electronically Signed: By: Odessa Fleming M.D. On: 08/25/2022 05:06   CT Head Wo Contrast  Result Date: 08/25/2022 CLINICAL DATA:  Subarachnoid hemorrhage. EXAM: CT HEAD WITHOUT CONTRAST TECHNIQUE: Contiguous axial images were obtained from  the base of the skull through the vertex without intravenous contrast. RADIATION DOSE REDUCTION: This exam was performed according to the departmental dose-optimization program which includes automated exposure control, adjustment of the mA and/or kV according to patient size and/or use of iterative reconstruction technique. COMPARISON:  08/24/2022 FINDINGS: Brain: Previously noted area of possible subarachnoid hemorrhage along the right frontal lobe is no longer seen; this was most likely related to beam hardening artifact on the prior exam. No evidence of acute infarction, hemorrhage, mass, mass effect, or midline shift. No hydrocephalus or extra-axial fluid collection. Vascular: No hyperdense vessel. Skull: Negative for fracture or focal lesion. Redemonstrated frontal scalp hematoma, with increased right greater than left periorbital component. Sinuses/Orbits: Mucous retention cyst in the right maxillary sinus. Otherwise clear paranasal sinuses. No acute finding in the orbits. Other: The mastoid air cells are well aerated. IMPRESSION: 1. Previously noted area of possible subarachnoid hemorrhage along the right frontal lobe is no longer seen, most likely related to beam hardening artifact on the prior exam. No additional acute intracranial process. 2. Redemonstrated frontal scalp hematoma, with increased right greater than left periorbital component. Electronically Signed   By: Wiliam Ke M.D.   On: 08/25/2022 03:23   CT HEAD WO CONTRAST  Result Date: 08/24/2022 CLINICAL DATA:  Fall, on blood thinners EXAM: CT HEAD WITHOUT CONTRAST CT CERVICAL SPINE WITHOUT CONTRAST TECHNIQUE: Multidetector CT imaging of the head and cervical spine was performed following the standard protocol without intravenous contrast. Multiplanar CT image reconstructions of the cervical spine were also generated. RADIATION DOSE REDUCTION: This exam was performed according to the departmental dose-optimization program which includes  automated exposure control, adjustment of the mA and/or kV according to  patient size and/or use of iterative reconstruction technique. COMPARISON:  07/21/2022 CT head and cervical spine FINDINGS: CT HEAD FINDINGS Brain: Suspect trace subarachnoid hemorrhage along the right frontal lobe (series 2, image 22), although evaluation is somewhat limited by beam hardening artifact. No parenchymal hemorrhage. No evidence of acute infarct, mass, mass effect, or midline shift. No hydrocephalus or extra-axial fluid collection. Partial empty sella. Age related cerebral atrophy. Periventricular white matter changes, likely the sequela of chronic small vessel ischemic disease. Vascular: No hyperdense vessel. Skull: Negative for fracture or focal lesion. Frontal scalp hematoma. Sinuses/Orbits: Small mucous retention cyst in the right maxillary sinus. Otherwise clear paranasal sinuses. No acute finding in the orbits. Other: The mastoid air cells are well aerated. CT CERVICAL SPINE FINDINGS Alignment: No traumatic listhesis. Skull base and vertebrae: Acute fracture through the base of the dens, extending into the posterior body (type 2) (series 8, images 29-33), which is posteriorly displaced approximately 4 mm. Bilateral fractures through the anterior arch of C1 (series 5, image 23), with approximately 4 mm of displacement. The fractures do not appear to involve the vertebral artery foramina. Osteopenia. Soft tissues and spinal canal: Trace prevertebral fluid. Suspect a small amount of hemorrhage along the dorsal aspect of C2, without significant mass effect on the thecal sac. 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 Upper chest: Negative. IMPRESSION: 1. Suspect trace subarachnoid hemorrhage along the right frontal lobe, although  evaluation is somewhat limited by beam hardening artifact. 2. Acute fracture through the base of the dens, extending into the posterior body (type 2) which is posteriorly displaced approximately 4 mm. 3. Bilateral fractures through the anterior arch of C1, with approximately 4 mm of displacement. 4. Suspect a small amount of hemorrhage along the dorsal aspect of C2, without significant mass effect on the thecal sac. This could be further evaluated with MRI. These results were called by telephone at the time of interpretation on 08/24/2022 at 9:21 pm to provider DAN FLOYD , who verbally acknowledged these results. Electronically Signed   By: Wiliam Ke M.D.   On: 08/24/2022 21:23   CT CERVICAL SPINE WO CONTRAST  Result Date: 08/24/2022 CLINICAL DATA:  Fall, on blood thinners EXAM: CT HEAD WITHOUT CONTRAST CT CERVICAL SPINE WITHOUT CONTRAST TECHNIQUE: Multidetector CT imaging of the head and cervical spine was performed following the standard protocol without intravenous contrast. Multiplanar CT image reconstructions of the cervical spine were also generated. RADIATION DOSE REDUCTION: This exam was performed according to the departmental dose-optimization program which includes automated exposure control, adjustment of the mA and/or kV according to patient size and/or use of iterative reconstruction technique. COMPARISON:  07/21/2022 CT head and cervical spine FINDINGS: CT HEAD FINDINGS Brain: Suspect trace subarachnoid hemorrhage along the right frontal lobe (series 2, image 22), although evaluation is somewhat limited by beam hardening artifact. No parenchymal hemorrhage. No evidence of acute infarct, mass, mass effect, or midline shift. No hydrocephalus or extra-axial fluid collection. Partial empty sella. Age related cerebral atrophy. Periventricular white matter changes, likely the sequela of chronic small vessel ischemic disease. Vascular: No hyperdense vessel. Skull: Negative for fracture or focal lesion.  Frontal scalp hematoma. Sinuses/Orbits: Small mucous retention cyst in the right maxillary sinus. Otherwise clear paranasal sinuses. No acute finding in the orbits. Other: The mastoid air cells are well aerated. CT CERVICAL SPINE FINDINGS  Alignment: No traumatic listhesis. Skull base and vertebrae: Acute fracture through the base of the dens, extending into the posterior body (type 2) (series 8, images 29-33), which is posteriorly displaced approximately 4 mm. Bilateral fractures through the anterior arch of C1 (series 5, image 23), with approximately 4 mm of displacement. The fractures do not appear to involve the vertebral artery foramina. Osteopenia. Soft tissues and spinal canal: Trace prevertebral fluid. Suspect a small amount of hemorrhage along the dorsal aspect of C2, without significant mass effect on the thecal sac. 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 Upper chest: Negative. IMPRESSION: 1. Suspect trace subarachnoid hemorrhage along the right frontal lobe, although evaluation is somewhat limited by beam hardening artifact. 2. Acute fracture through the base of the dens, extending into the posterior body (type 2) which is posteriorly displaced approximately 4 mm. 3. Bilateral fractures through the anterior arch of C1, with approximately 4 mm of displacement. 4. Suspect a small amount of hemorrhage along the dorsal aspect of C2, without significant mass effect on the thecal sac. This could be further evaluated with MRI. These results were called by telephone at the time of interpretation on 08/24/2022 at 9:21 pm to provider DAN FLOYD , who verbally acknowledged these results. Electronically Signed   By: Wiliam Ke M.D.   On: 08/24/2022 21:23   DG Chest Port 1 View  Result Date: 08/24/2022 CLINICAL DATA:   Trauma EXAM: PORTABLE CHEST 1 VIEW COMPARISON:  CT of the chest 12/15/2018 FINDINGS: There is prominence of the right paratracheal region corresponding to ectatic vasculature, unchanged. Heart size is within normal limits. There is no focal lung infiltrate, pleural effusion or pneumothorax. No acute fractures are seen. IMPRESSION: No active disease. Electronically Signed   By: Darliss Cheney M.D.   On: 08/24/2022 20:55   DG Pelvis Portable  Result Date: 08/24/2022 CLINICAL DATA:  Level 2 trauma on blood thinners. EXAM: PORTABLE PELVIS 1-2 VIEWS COMPARISON:  07/21/2022 FINDINGS: Left THA. No radiographic evidence of loosening. No acute fracture or dislocation. IVC filter. IMPRESSION: No evidence of acute fracture or dislocation. Electronically Signed   By: Minerva Fester M.D.   On: 08/24/2022 20:55    Assessment/Plan: Type II odontoid fracture, cervical stenosis, cervical spinal cord injury, central cord syndrome: I have again discussed this with the patient.  It is unlikely that her fracture will heal without surgery.  Furthermore I would recommend a posterior decompression of her spinal stenosis.  I recommend PT, OT, rehab and we will schedule her surgery in the coming weeks.  She will need to continue her collar for now.  LOS: 2 days     Cristi Loron 08/26/2022, 7:01 AM     Patient ID: Hannah [name], female   DOB: 09/06/1940, 82 y.o.   MRN: [id number]

## 2022-08-26 NOTE — Progress Notes (Signed)
Pt unable to urinate since 0100am, bladder scan showed of urine in bladder. Opyd, MD notified and order for In and out cath placed. In and out catheterization completed at the bedside 5w37, for acute urinary retention by Christean Grief, RN and Boyd Kerbs, RN. Perineal care completed, gown and bed pad changed, and pt agreeable with intervention. Sterile technique maintained throughout procedure, and of amber clear urine collected. Pt tolerated catheterization well, no difficulties. Pt resting comfortably in bed.

## 2022-08-26 NOTE — Progress Notes (Signed)
Spoke with Marrian Salvage, patient daughter re: PICC placement order. Explained PICC risk and benefits, per daughter will discuss with family first before signing consent. RN made aware.

## 2022-08-26 NOTE — Progress Notes (Signed)
eLink Physician-Brief Progress Note Patient Name: Hannah [name] DOB: September 21, 1940 MRN: [id number]   Date of Service  08/26/2022  HPI/Events of Note  Patient with oliguria, CVP 3.  eICU Interventions  NS 500 ml iv fluid bolus x 1 ordered.        Migdalia Dk 08/26/2022, 10:55 PM

## 2022-08-26 NOTE — Progress Notes (Signed)
MD notified of limited access for levo. IV team consulted and unable to gain access. MD okay to use available PIV( non-ultrasound guided) for access in the meanwhile. IV watch in place.

## 2022-08-26 NOTE — Progress Notes (Signed)
   08/26/22 0837  Vitals  BP (!) 81/52  MAP (mmHg) (!) 62  BP Location Left Arm  BP Method Automatic  Patient Position (if appropriate) Lying  Pulse Rate 72  Pulse Rate Source Monitor  ECG Heart Rate 73  Resp 10  Level of Consciousness  Level of Consciousness Alert  MEWS COLOR  MEWS Score Color Yellow  Oxygen Therapy  SpO2 (!) 88 %  O2 Device Room Air   Changed BP cuff to adult size and also changed pt position to sitting up, and changed to left arm.  Advised Dr. Jerral Ralph that BP not improving.

## 2022-08-26 NOTE — Progress Notes (Signed)
   08/26/22 0800  Vitals  BP (!) 80/44  MAP (mmHg) (!) 54  BP Location Right Arm  BP Method Automatic  Patient Position (if appropriate) Lying  Pulse Rate 72  Pulse Rate Source Monitor  ECG Heart Rate 72  Resp (!) 3  Level of Consciousness  Level of Consciousness Alert  MEWS COLOR  MEWS Score Color Red  Oxygen Therapy  SpO2 96 %  O2 Device Room Air  Pain Assessment  Pain Scale 0-10  Pain Score 1  Pain Type Acute pain  Pain Location Neck   Advised Dr. Jerral Ralph about patient being RED MEWS, he was rounding in vicinity and said he would be rounding on her shortly.

## 2022-08-26 NOTE — Progress Notes (Signed)
VAST at bedside for USG PIV placement for vasopressor infusion order. Bilateral arms assessed using ultrasound and no suitable veins noted on either side. Vasopressor not currently needed, but is ordered. Discussed findings with primary RN, IV team recommends central line placement at this time if vasopressor is started for BP maintenance.

## 2022-08-26 NOTE — Care Management Important Message (Signed)
Important Message  Patient Details  Name: Hannah [name] MRN: [id number] Date of Birth: 04-28-1940   Medicare Important Message Given:  Yes     Dorena Bodo 08/26/2022, 4:02 PM

## 2022-08-26 NOTE — Progress Notes (Signed)
OT Cancellation Note  Patient Details Name: Hannah [name] MRN: [id number] DOB: 07-May-1940   Cancelled Treatment:    Reason Eval/Treat Not Completed: Medical issues which prohibited therapy;Patient not medically ready Pt with continued hypotension and likely transfer to ICU. Holding therapy attempts today. Will recheck tomorrow.   Lorre Munroe 08/26/2022, 10:30 AM

## 2022-08-26 NOTE — Progress Notes (Signed)
   08/26/22 0845  Vitals  BP (!) 72/44  MAP (mmHg) (!) 54  BP Location Left Arm  BP Method Automatic  Patient Position (if appropriate) Lying  Pulse Rate 66  Pulse Rate Source Monitor  ECG Heart Rate 67  Resp (!) 7  Level of Consciousness  Level of Consciousness Alert  MEWS COLOR  MEWS Score Color Red  Oxygen Therapy  SpO2 90 %  O2 Device Room Air   Advised Dr. Jerral Ralph of BP, he rounded on patient and ordered 500 cc bolus.  And called ICU to come and evaluate patient.

## 2022-08-26 NOTE — Progress Notes (Signed)
PT Cancellation Note  Patient Details Name: Hannah [name] MRN: [id number] DOB: [birth year]   Cancelled Treatment:    Reason Eval/Treat Not Completed: Patient not medically ready Pt with continued hypotension and likely transfer to ICU. Will hold PT attempts today.  Aleda Grana, PT, DPT 08/26/22, 10:53 AM    [name] 08/26/2022, 10:52 AM

## 2022-08-27 DIAGNOSIS — S066X0A Traumatic subarachnoid hemorrhage without loss of consciousness, initial encounter: Secondary | ICD-10-CM | POA: Diagnosis not present

## 2022-08-27 DIAGNOSIS — S12090A Other displaced fracture of first cervical vertebra, initial encounter for closed fracture: Secondary | ICD-10-CM

## 2022-08-27 DIAGNOSIS — E861 Hypovolemia: Secondary | ICD-10-CM | POA: Diagnosis not present

## 2022-08-27 LAB — CBC
HCT: 43.8 % (ref 36.0–46.0)
Hemoglobin: 13.7 g/dL (ref 12.0–15.0)
MCH: 30 pg (ref 26.0–34.0)
MCHC: 31.3 g/dL (ref 30.0–36.0)
MCV: 95.8 fL (ref 80.0–100.0)
Platelets: 134 10*3/uL — ABNORMAL LOW (ref 150–400)
RBC: 4.57 MIL/uL (ref 3.87–5.11)
RDW: 17.1 % — ABNORMAL HIGH (ref 11.5–15.5)
WBC: 11.5 10*3/uL — ABNORMAL HIGH (ref 4.0–10.5)
nRBC: 0 % (ref 0.0–0.2)

## 2022-08-27 LAB — CORTISOL: Cortisol, Plasma: 31 ug/dL

## 2022-08-27 LAB — BASIC METABOLIC PANEL
Anion gap: 10 (ref 5–15)
BUN: 24 mg/dL — ABNORMAL HIGH (ref 8–23)
CO2: 17 mmol/L — ABNORMAL LOW (ref 22–32)
Calcium: 8.1 mg/dL — ABNORMAL LOW (ref 8.9–10.3)
Chloride: 108 mmol/L (ref 98–111)
Creatinine, Ser: 1.39 mg/dL — ABNORMAL HIGH (ref 0.44–1.00)
GFR, Estimated: 38 mL/min — ABNORMAL LOW (ref >60)
Glucose, Bld: 128 mg/dL — ABNORMAL HIGH (ref 70–99)
Potassium: 3.8 mmol/L (ref 3.5–5.1)
Sodium: 135 mmol/L (ref 135–145)

## 2022-08-27 LAB — GLUCOSE, CAPILLARY
Glucose-Capillary: 130 mg/dL — ABNORMAL HIGH (ref 70–99)
Glucose-Capillary: 98 mg/dL (ref 70–99)
Glucose-Capillary: 106 mg/dL — ABNORMAL HIGH (ref 70–99)
Glucose-Capillary: 120 mg/dL — ABNORMAL HIGH (ref 70–99)
Glucose-Capillary: 118 mg/dL — ABNORMAL HIGH (ref 70–99)

## 2022-08-27 LAB — MAGNESIUM: Magnesium: 1.8 mg/dL (ref 1.7–2.4)

## 2022-08-27 LAB — BRAIN NATRIURETIC PEPTIDE: B Natriuretic Peptide: 345.3 pg/mL — ABNORMAL HIGH (ref 0.0–100.0)

## 2022-08-27 MED ORDER — MAGNESIUM SULFATE 2 GM/50ML IV SOLN
2.0000 g | Freq: Once | INTRAVENOUS | Status: AC
  Administered 2022-08-27: 2 g via INTRAVENOUS
  Filled 2022-08-27: qty 50

## 2022-08-27 MED ORDER — SODIUM CHLORIDE 0.9 % IV BOLUS
500.0000 mL | Freq: Once | INTRAVENOUS | Status: AC
  Administered 2022-08-27: 500 mL via INTRAVENOUS

## 2022-08-27 MED ORDER — POTASSIUM CHLORIDE CRYS ER 20 MEQ PO TBCR
40.0000 meq | EXTENDED_RELEASE_TABLET | Freq: Once | ORAL | Status: DC
  Filled 2022-08-27: qty 2

## 2022-08-27 MED ORDER — POTASSIUM CHLORIDE 10 MEQ/50ML IV SOLN
10.0000 meq | INTRAVENOUS | Status: AC
  Administered 2022-08-27 (×3): 10 meq via INTRAVENOUS
  Filled 2022-08-27 (×2): qty 50

## 2022-08-27 MED ORDER — SODIUM CHLORIDE 0.9 % IV SOLN: INTRAVENOUS | Status: AC

## 2022-08-27 NOTE — Evaluation (Signed)
Physical Therapy Evaluation Patient Details Name: Hannah [name] MRN: [id number] DOB: 27-Jul-1940 Today's Date: 08/27/2022  History of Present Illness  Pt is an 82 y/o F presenting to ED on 8/23 wtih fall at home resulting in C1 and type 2 odontiod fx with spinal stenosis and compression, developed hypotension. Also had small subarachnoid hemorrhage and bil UE weakenss. PMHx: paroxysmal atrial fibrillation, chronic diastolic heart failure, CKD 3A, type 2 diabetes mellitus.  Clinical Impression  Pt admitted with above diagnosis. Ambulatory with a rollator PTA. Currently requires Max assist +2 for bed mobility and mod-max A +2 for trunk control to safely sit EOB. LEs grossly 2+/5 MMT at EOB. VSS throughout session (see below,) however did report some lightheadedness while seated. Daughter mentioned potentially arranging care for pt at home following D/c. Agreeable to [facility] if she qualifies. Would like for AC from [facility] to follow and monitor progress during acute stay and post-op. Hopeful to see some significant improvements in the coming days. Potential cervical surgery next week. Pt currently with functional limitations due to the deficits listed below (see PT Problem List). Pt will benefit from acute skilled PT to increase their independence and safety with mobility to allow discharge.           If plan is discharge home, recommend the following: Two people to help with walking and/or transfers;Two people to help with bathing/dressing/bathroom;Assistance with cooking/housework;Assistance with feeding;Direct supervision/assist for medications management;Direct supervision/assist for financial management;Assist for transportation;Help with stairs or ramp for entrance   Can travel by private vehicle     no    Equipment Recommendations Other (comment) (TBD next venue of care)  Recommendations for Other Services  Rehab consult    Functional Status Assessment Patient has had a recent decline in their  functional status and demonstrates the ability to make significant improvements in function in a reasonable and predictable amount of time.     Precautions / Restrictions Precautions Precautions: Cervical;Fall Precaution Booklet Issued: No Precaution Comments: Reviewed Required Braces or Orthoses: Cervical Brace Cervical Brace: Hard collar;At all times Restrictions Weight Bearing Restrictions: No      Mobility  Bed Mobility Overal bed mobility: Needs Assistance Bed Mobility: Sidelying to Sit, Sit to Sidelying, Rolling Rolling: Max assist, +2 for physical assistance Sidelying to sit: Max assist, +2 for physical assistance     Sit to sidelying: Max assist, +2 for physical assistance General bed mobility comments: Educated on log roll technique, required Max A +2 to safely complete. Assisted with LEs, trunk, and cervical support with hard collar in place. Minimal use of LEs and UEs with transitions.    Transfers                   General transfer comment: Deferred due to poor trunk and LE control, BP - weaning pressors    Ambulation/Gait                  Stairs            Wheelchair Mobility     Tilt Bed    Modified Rankin (Stroke Patients Only)       Balance Overall balance assessment: Needs assistance Sitting-balance support: Feet supported Sitting balance-Leahy Scale: Zero Sitting balance - Comments: Requires +2 assist to safely stabilize. Mod-max assist, attempted Rt and Lt lateral lean. Pt able to facilitate pulling herself up from Rt lean to midline with assist. Unable to return to midline from left lean without total assist. Postural control: Left lateral lean  Pertinent Vitals/Pain Pain Assessment Pain Assessment: Faces Faces Pain Scale: Hurts little more Pain Location: generalized wtih movement. Neck Pain Descriptors / Indicators: Discomfort, Grimacing Pain Intervention(s): Limited  activity within patient's tolerance, Monitored during session, Repositioned    Home Living Family/patient expects to be discharged to:: Private residence Living Arrangements: Alone Available Help at Discharge: Family;Available PRN/intermittently Type of Home: House Home Access: Ramped entrance;Stairs to enter       Home Layout: One level Home Equipment: Rollator (4 wheels)      Prior Function Prior Level of Function : Needs assist             Mobility Comments: uses walker ADLs Comments: reports ind     Extremity/Trunk Assessment   Upper Extremity Assessment Upper Extremity Assessment: Defer to OT evaluation RUE Deficits / Details: AAROM WFL at shoulder, distal vs proximal weakness, cannot bring hand to mouth, cannot form fist RUE Sensation: decreased light touch;decreased proprioception (reports therapist was touching 3rd finger when tehrapist was touching 5th finger) LUE Deficits / Details: AAROM WFL at shoulder, can minimally hold arm up against gravity, cannot bring hand to mouth, cannot form fist LUE Sensation: decreased light touch;decreased proprioception (reports therapist is touching her third finger when therapist was touching 2nd finger)    Lower Extremity Assessment Lower Extremity Assessment: Generalized weakness;Difficult to assess due to impaired cognition (SILT to distal LEs. Grossly 2+/5 and symmetrical on assessment today.)    Cervical / Trunk Assessment Cervical / Trunk Assessment: Other exceptions (Hard collar at all times)  Communication   Communication Communication: No apparent difficulties  Cognition Arousal: Alert Behavior During Therapy: WFL for tasks assessed/performed Overall Cognitive Status: Impaired/Different from baseline Area of Impairment: Attention, Memory, Following commands, Safety/judgement, Awareness, Problem solving                   Current Attention Level: Focused Memory: Decreased short-term memory Following  Commands: Follows one step commands with increased time Safety/Judgement: Decreased awareness of safety, Decreased awareness of deficits   Problem Solving: Slow processing, Difficulty sequencing, Requires verbal cues, Requires tactile cues General Comments: delayed responses, perseverative on having a ramp, and that her daughters live down the road        General Comments General comments (skin integrity, edema, etc.): Pre activity, supine with HOB elevated 30 deg: BP 104/63, SpO2 9(% on RA, HR 76, RR 12. Seated EOB: BP 124/68 (subjective lightheadedness, SpO2 94% on RA.    Exercises General Exercises - Lower Extremity Ankle Circles/Pumps: AROM, Both, 15 reps, Supine Quad Sets: Strengthening, Both, 10 reps, Supine Gluteal Sets: Strengthening, Both, 10 reps, Supine Other Exercises Other Exercises: Weight shifting onto each elbow, able to facilitate towards Rt and return to midline with min assist. Dependent on Lt. Anterior and posterior lean dependent.   Assessment/Plan    PT Assessment Patient needs continued PT services  PT Problem List Decreased strength;Decreased range of motion;Decreased activity tolerance;Decreased balance;Decreased mobility;Decreased coordination;Decreased knowledge of use of DME;Decreased cognition;Decreased safety awareness;Decreased knowledge of precautions;Cardiopulmonary status limiting activity;Impaired sensation;Impaired tone;Obesity;Pain       PT Treatment Interventions DME instruction;Gait training;Functional mobility training;Therapeutic activities;Therapeutic exercise;Balance training;Neuromuscular re-education;Patient/family education;Wheelchair mobility training;Modalities    PT Goals (Current goals can be found in the Care Plan section)  Acute Rehab PT Goals Patient Stated Goal: Get better PT Goal Formulation: With patient Time For Goal Achievement: 09/09/22 Potential to Achieve Goals: Fair    Frequency Min 1X/week     Co-evaluation  PT/OT/SLP Co-Evaluation/Treatment: Yes Reason for Co-Treatment: Complexity  of the patient's impairments (multi-system involvement);Necessary to address cognition/behavior during functional activity;To address functional/ADL transfers PT goals addressed during session: Mobility/safety with mobility;Balance;Strengthening/ROM OT goals addressed during session: ADL's and self-care;Strengthening/ROM       AM-PAC PT "6 Clicks" Mobility  Outcome Measure Help needed turning from your back to your side while in a flat bed without using bedrails?: Total Help needed moving from lying on your back to sitting on the side of a flat bed without using bedrails?: Total Help needed moving to and from a bed to a chair (including a wheelchair)?: Total Help needed standing up from a chair using your arms (e.g., wheelchair or bedside chair)?: Total Help needed to walk in hospital room?: Total Help needed climbing 3-5 steps with a railing? : Total 6 Click Score: 6    End of Session Equipment Utilized During Treatment: Cervical collar Activity Tolerance: Patient tolerated treatment well Patient left: in bed;with call bell/phone within reach;with bed alarm set;with nursing/sitter in room;with family/visitor present;with SCD's reapplied Nurse Communication: Mobility status;Other (comment);Need for lift equipment;Precautions (vitals during assessment.) PT Visit Diagnosis: Muscle weakness (generalized) (M62.81);History of falling (Z91.81);Difficulty in walking, not elsewhere classified (R26.2);Other symptoms and signs involving the nervous system (R29.898);Other (comment);Pain;Dizziness and giddiness (R42) (Quadriparesis)    Time: 4696-2952 PT Time Calculation (min) (ACUTE ONLY): 21 min   Charges:   PT Evaluation $PT Eval High Complexity: 1 High   PT General Charges $$ ACUTE PT VISIT: 1 Visit         Kathlyn Sacramento, PT, DPT [facility]  Rehabilitation Services Physical Therapist Office:  [phone] Website: [email or web address]   Berton Mount 08/27/2022, 1:47 PM

## 2022-08-27 NOTE — Evaluation (Addendum)
Clinical/Bedside Swallow Evaluation Patient Details  Name: Hannah [name] MRN: [id number] Date of Birth: [birth year]  Today's Date: 08/27/2022 Time: SLP Start Time (ACUTE ONLY): 1235 SLP Stop Time (ACUTE ONLY): 1245 SLP Time Calculation (min) (ACUTE ONLY): 10 min  Past Medical History:  Past Medical History:  Diagnosis Date   Acute massive pulmonary embolism (HCC) 07/13/2012   Massive PE w/ PEA arrest 07/13/12 >TNK >IVC filter >discharged on comadin     Anticoagulated on warfarin    Chronic diastolic CHF (congestive heart failure) (HCC) 03/15/2017   Chronic kidney disease, stage 3a (HCC) 12/15/2018   Diabetes mellitus, type 2 (HCC) 07/15/2012   with peripheral neuropathy   DM2 (diabetes mellitus, type 2) (HCC) 07/23/2012   HFrEF (heart failure with reduced ejection fraction) (HCC) 03/15/2017   Non-ischemic cardiomyopathy // Cardiac catheterization 01/01/21:  RCA prox to mid 20 // Echocardiogram 12/26/20:  EF 30-35, ant septum/apex, mid inf-sept AK; ant-lat/ant/inf HK, Gr 1 DD, mild LVH, normal RVSF, RVSP 27.2, small effusion (ant to RV), mild MR, mod TR, AV sclerosis w/o AS   Hyperlipemia 07/14/2012   Hyperlipidemia 02/25/2013   Hypertension 07/14/2012   Hypothyroid 06/06/2013   Large bowel stricture (HCC)    s/p colectomy in 2016   Neuropathy 02/12/2015   Osteoarthritis    s/p hip and knee replacements   PAF (paroxysmal atrial fibrillation) (HCC)    Pulmonary hypertension (HCC) 07/26/2016   Syncope 10/2018   Thyroid disease    Past Surgical History:  Past Surgical History:  Procedure Laterality Date   COLONOSCOPY N/A 03/12/2016   Procedure: COLONOSCOPY;  Surgeon: Dorena Cookey, MD;  Location[facility] ENDOSCOPY;  Service: Endoscopy;  Laterality: N/A;   COLOSTOMY N/A 06/03/2014   Procedure: COLOSTOMY;  Surgeon: Harriette Bouillon, MD;  Location: [facility] OR;  Service: General;  Laterality: N/A;   FLEXIBLE SIGMOIDOSCOPY N/A 05/30/2014   Procedure: Arnell Sieving;  Surgeon: Jeani Hawking, MD;  Location: [facility]  ENDOSCOPY;  Service: Endoscopy;  Laterality: N/A;   INSERTION OF VENA CAVA FILTER N/A 07/16/2012   Procedure: INSERTION OF VENA CAVA FILTER;  Surgeon: [name], MD;  Location: [facility] CATH LAB;  Service: Cardiovascular;  Laterality: N/A;   KNEE ARTHROSCOPY     LEFT HEART CATH AND CORONARY ANGIOGRAPHY N/A 01/01/2021   Procedure: LEFT HEART CATH AND CORONARY ANGIOGRAPHY;  Surgeon: Kathleene Hazel, MD;  Location: [facility] INVASIVE CV LAB;  Service: Cardiovascular;  Laterality: N/A;   LEFT HEART CATH AND CORONARY ANGIOGRAPHY N/A 04/29/2022   Procedure: LEFT HEART CATH AND CORONARY ANGIOGRAPHY;  Surgeon: Orbie Pyo, MD;  Location: [facility] INVASIVE CV LAB;  Service: Cardiovascular;  Laterality: N/A;   PARTIAL COLECTOMY N/A 06/03/2014   Procedure: PARTIAL COLECTOMY;  Surgeon: Harriette Bouillon, MD;  Location: [facility] OR;  Service: General;  Laterality: N/A;   REDUCTION MAMMAPLASTY Bilateral    RIGHT HEART CATH N/A 12/30/2020   Procedure: RIGHT HEART CATH;  Surgeon: Kathleene Hazel, MD;  Location: [facility] INVASIVE CV LAB;  Service: Cardiovascular;  Laterality: N/A;   SP ARTHRO HIP*L*     HPI:  Patient is an 82 y.o. female with PMH: HLD, HTN, DM-2, OA, syncope, pulmonary hypertension, thyroid disease, CKD IIIA, CHF. She presented to the hospital on 08/26/22 after sustaining fall at home resulting in C1 and type 2 odontiod fracture with spinal stenosis and compression, small SAH, bilateral UE weakness. She developed hypotension. She failed [facility] swallow screen with RN on 8/24, prompting SLP swallow evaluation order.    Assessment / Plan / Recommendation  Clinical Impression  Patient is presenting with a questionable dysphagia as per this bedside swallow evaluation. Of note, she does not have any h/o oropharyngeal dysphagia as per chart review. When SLP entered room, patient awake but appears fatigued/lethargic. She has hard cervical collar on. She was only able to open mouth slightly when SLP requested, but  oral mucosa was moist and free from secretions. Patient receptive to straw sips of thin liquids (water). Suspect swallow initiation mildly delayed but no overt s/s aspiration during or after. Patient did grimace when taking sips of water. SLP is recommending initiate PO diet of clear liquids and will follow for toleration and ability to advance. Currently, she is to have C1-6 posterior cervical decompression, instrumentation and fusion. SLP Visit Diagnosis: Dysphagia, unspecified (R13.10)    Aspiration Risk  Mild aspiration risk    Diet Recommendation Thin liquid   (clear liquids)  Liquid Administration via: Straw Medication Administration: Crushed with puree Supervision: Staff to assist with self feeding;Full supervision/cueing for compensatory strategies Compensations: Slow rate;Small sips/bites    Other  Recommendations Oral Care Recommendations: Oral care BID    Recommendations for follow up therapy are one component of a multi-disciplinary discharge planning process, led by the attending physician.  Recommendations may be updated based on patient status, additional functional criteria and insurance authorization.  Follow up Recommendations Other (comment)      Assistance Recommended at Discharge    Functional Status Assessment Patient has had a recent decline in their functional status and demonstrates the ability to make significant improvements in function in a reasonable and predictable amount of time.  Frequency and Duration min 2x/week  1 week       Prognosis Prognosis for improved oropharyngeal function: Good      Swallow Study   General Date of Onset: 08/27/22 HPI: Patient is an 82 y.o. female with PMH: HLD, HTN, DM-2, OA, syncope, pulmonary hypertension, thyroid disease, CKD IIIA, CHF. She presented to the hospital on 08/26/22 after sustaining fall at home resulting in C1 and type 2 odontiod fracture with spinal stenosis and compression, small SAH, bilateral UE weakness.  She developed hypotension. She failed [facility] swallow screen with RN on 8/24, prompting SLP swallow evaluation order. Type of Study: Bedside Swallow Evaluation Previous Swallow Assessment: none found Diet Prior to this Study: NPO Temperature Spikes Noted: No History of Recent Intubation: No Behavior/Cognition: Alert;Lethargic/Drowsy;Cooperative Oral Cavity Assessment: Within Functional Limits Oral Care Completed by SLP: No Oral Cavity - Dentition: Adequate natural dentition Self-Feeding Abilities: Total assist Patient Positioning: Upright in bed Baseline Vocal Quality: Low vocal intensity Volitional Cough: Weak    Oral/Motor/Sensory Function Overall Oral Motor/Sensory Function: Within functional limits   Ice Chips     Thin Liquid Thin Liquid: Impaired Presentation: Straw Pharyngeal  Phase Impairments: Suspected delayed Swallow    Nectar Thick Nectar Thick Liquid: Not tested   Honey Thick Honey Thick Liquid: Not tested   Puree Puree: Not tested   Solid     Solid: Not tested     Angela Nevin, MA, CCC-SLP Speech Therapy

## 2022-08-27 NOTE — Progress Notes (Signed)
eLink Physician-Brief Progress Note Patient Name: Hannah [name] DOB: [birth year] MRN: [id number]   Date of Service  08/27/2022  HPI/Events of Note  CVP still 3 after 500 ml NS bolus and patient is oliguric.  eICU Interventions  NS gtt started at 75 ml / hour and BNP ordered to better evaluate volume status.        [name] U [name] 08/27/2022, 3:00 AM

## 2022-08-27 NOTE — Consult Note (Signed)
NAME:  Hannah [name], MRN:  [id number], DOB:  1940/10/21, LOS: 3 ADMISSION DATE:  08/24/2022, CONSULTATION DATE: 08/26/2022 REFERRING MD: [facility], CHIEF COMPLAINT: Cervical fracture hypotension  History of Present Illness:  82 year old female [place] extensive past medical history is well-documented below who had a fall with a C1-2 fracture and a small subarachnoid hemorrhage bilateral extremity weakness for which she was admitted to [facility].  She has been evaluated by neurology and neurosurgery.  Of note she had a pulmonary embolism in the past which led to a PEA arrest currently she is on Coumadin 2.5 mg daily which has been reversed pulmonary critical care was called to the bedside 08/26/2022 for refractory hypotension.  On exam she will be transferred to the intensive care unit we have requested 4 [place]. She may need a femoral central line and a femoral arterial line placed in the near future to actually maintain blood pressure readings and to supply her with fluids and vasopressors.  Pertinent  Medical History  PE, HFpEF, CKD 3a, DM type 2 with neuropathy, HLD, HTN, Hypothyroidism, PAF, Hypothyroidism  Significant Hospital Events: Including procedures, antibiotic start and stop dates in addition to other pertinent events   8/21 fall at home, admit, neurosurgery consulted 8/23 hypotension >> transferred to intensive care unit  Interim History / Subjective:  Remains on pressors.  Had trouble swallowing.  Difficulty passing urine.  Objective   Blood pressure 136/65, pulse 79, temperature 97.6 F (36.4 C), temperature source Oral, resp. rate 10, height 5\' 7"  (1.702 m), weight 79.7 kg, SpO2 98%. CVP:  [0 mmHg-2 mmHg] 2 mmHg      Intake/Output Summary (Last 24 hours) at 08/27/2022 1037 Last data filed at 08/27/2022 0600 Gross per 24 hour  Intake 2932.17 ml  Output 0 ml  Net 2932.17 ml   Filed Weights   08/24/22 2025 08/26/22 0500 08/26/22 1115  Weight: 87.5 kg 81.5 kg 79.7 kg     Examination:  General - alert Eyes - pupils reactive ENT - C collar on Cardiac - regular rate/rhythm, no murmur Chest - equal breath sounds b/l, no wheezing or rales Abdomen - soft, non tender, + bowel sounds Extremities - no cyanosis, clubbing, or edema Skin - no rashes Neuro - moves extremities  Resolved Hospital Problem list     Assessment & Plan:   Hypotension. - likely hypovolemia and autonomic dysfunction in setting of C spine fracture - wean pressors to keep MAP > 65 - continue IV fluids with goal CVP 6 to 10  C1-2 fracture with trace traumatic SAH. - per neurosurgery; will likely need surgery   Hx of PE complicated by PEA. Chronic HFpEF, HLD. - INR better; hold anticoagulation until okay with neurosurgery - resume lipitor when able to swallow pills - hold outpt lasix, toprol, entresto, aldactone   DM type 2 with peripheral neurolpathy. - SSI - resume cymbalta when able to take pills   Hx of hypothyroidism. - resume synthroid when she can take pills  CKD 3a. Hypokalemia. Non-gap metabolic acidosis. Urine retention. - f/u BMET - monitor urine outpt - place foley  Dysphagia. - f/u with speech therapy  Best Practice (right click and "Reselect all SmartList Selections" daily)   Diet/type: NPO DVT prophylaxis: not indicated GI prophylaxis: PPI Lines: N/A Foley:  N/A Code Status:  full code Last date of multidisciplinary goals of care discussion [tbd]  Labs       Latest Ref Rng & Units 08/27/2022    3:13 AM 08/26/2022  7:03 AM 08/25/2022   12:12 AM  CMP  Glucose 70 - 99 mg/dL 601  093  235   BUN 8 - 23 mg/dL 24  24  17    Creatinine 0.44 - 1.00 mg/dL 5.73  2.20  2.54   Sodium 135 - 145 mmol/L 135  135  135   Potassium 3.5 - 5.1 mmol/L 3.8  4.3  3.9   Chloride 98 - 111 mmol/L 108  107  103   CO2 22 - 32 mmol/L 17  17  17    Calcium 8.9 - 10.3 mg/dL 8.1  8.3  8.2   Total Protein 6.5 - 8.1 g/dL   5.6   Total Bilirubin 0.3 - 1.2 mg/dL   1.8    Alkaline Phos 38 - 126 U/L   48   AST 15 - 41 U/L   18   ALT 0 - 44 U/L   14        Latest Ref Rng & Units 08/27/2022    3:13 AM 08/26/2022    7:03 AM 08/25/2022   12:12 AM  CBC  WBC 4.0 - 10.5 K/uL 11.5  10.9  11.9   Hemoglobin 12.0 - 15.0 g/dL 27.0  62.3  76.2   Hematocrit 36.0 - 46.0 % 43.8  44.3  46.4   Platelets 150 - 400 K/uL 134  134  146     CBG (last 3)  Recent Labs    08/26/22 2305 08/27/22 0259 08/27/22 0758  GLUCAP 121* 130* 98     Critical care time: 36 minutes  Coralyn Helling, MD [facility]/Critical Care Pager - [phone] or [phone] 08/27/2022, 10:50 AM

## 2022-08-27 NOTE — Evaluation (Signed)
Occupational Therapy Evaluation Patient Details Name: Hannah [name] MRN: [id number] DOB: 07-11-1940 Today's Date: 08/27/2022   History of Present Illness Pt is an 82 y/o F presenting to ED on 8/23 wtih fall at home resulting in C1 and type 2 odontiod fx with spinal stenosis and compression, developed hypotension. Also had small subarachnoid hemorrhage and bil UE weakenss. PMH includes HLD, HTN, DM2, HFrEF, OA, PAF, syncope, pulmonary hypertension, thyroid disease, CKD IIIA, CHF.   Clinical Impression   Pt reports using walker at baseline for mobility, ind with ADLs, lives alone but her daughters live close by. Pt currently able to sit EOB with max A +2 this session, strong L lateral lean, needing mod-max A to remain upright. Pt needing mod-total A for ADLs, transfers deferred this session. Pt reporting dizziness upon sitting EOB, but BP WNL. Pt presenting with impairments listed below, will follow acutely. Patient will benefit from intensive inpatient follow up therapy, >3 hours/day to maximize safety/ind with ADLs/functional mobility.       If plan is discharge home, recommend the following: Two people to help with walking and/or transfers;A lot of help with bathing/dressing/bathroom;Assistance with cooking/housework;Direct supervision/assist for medications management;Assist for transportation;Direct supervision/assist for financial management;Help with stairs or ramp for entrance    Functional Status Assessment  Patient has had a recent decline in their functional status and demonstrates the ability to make significant improvements in function in a reasonable and predictable amount of time.  Equipment Recommendations  Other (comment) (defer)    Recommendations for Other Services PT consult;Rehab consult     Precautions / Restrictions Precautions Precautions: Fall;Cervical Precaution Comments: followed cervical precautions with mobility Required Braces or Orthoses: Cervical  Brace Cervical Brace: Hard collar;At all times Restrictions Weight Bearing Restrictions: No      Mobility Bed Mobility Overal bed mobility: Needs Assistance Bed Mobility: Sidelying to Sit, Sit to Sidelying, Rolling Rolling: Max assist, +2 for physical assistance Sidelying to sit: Max assist, +2 for physical assistance     Sit to sidelying: Max assist, +2 for physical assistance General bed mobility comments: using log roll technique    Transfers                   General transfer comment: deferred      Balance Overall balance assessment: Needs assistance Sitting-balance support: Feet supported Sitting balance-Leahy Scale: Poor Sitting balance - Comments: L lateral lean, mod-max A at all times to remain upright at midline                                   ADL either performed or assessed with clinical judgement   ADL Overall ADL's : Needs assistance/impaired Eating/Feeding: NPO   Grooming: Moderate assistance;Bed level   Upper Body Bathing: Maximal assistance;Bed level   Lower Body Bathing: Maximal assistance;Bed level   Upper Body Dressing : Maximal assistance;Bed level   Lower Body Dressing: Maximal assistance;Bed level   Toilet Transfer: Total assistance;+2 for physical assistance   Toileting- Clothing Manipulation and Hygiene: Maximal assistance       Functional mobility during ADLs: Maximal assistance;+2 for physical assistance       Vision   Vision Assessment?: No apparent visual deficits     Perception Perception: Not tested       Praxis Praxis: Not tested       Pertinent Vitals/Pain Pain Assessment Pain Assessment: Faces Pain Score: 4  Faces Pain Scale: Hurts little  more Pain Location: generalized wtih movement Pain Descriptors / Indicators: Discomfort, Grimacing Pain Intervention(s): Limited activity within patient's tolerance, Monitored during session, Repositioned     Extremity/Trunk Assessment Upper  Extremity Assessment Upper Extremity Assessment: Generalized weakness;RUE deficits/detail;LUE deficits/detail RUE Deficits / Details: AAROM WFL at shoulder, distal vs proximal weakness, cannot bring hand to mouth, cannot form fist RUE Sensation: decreased light touch;decreased proprioception (reports therapist was touching 3rd finger when tehrapist was touching 5th finger) LUE Deficits / Details: AAROM WFL at shoulder, can minimally hold arm up against gravity, cannot bring hand to mouth, cannot form fist LUE Sensation: decreased light touch;decreased proprioception (reports therapist is touching her third finger when therapist was touching 2nd finger)   Lower Extremity Assessment Lower Extremity Assessment: Defer to PT evaluation   Cervical / Trunk Assessment Cervical / Trunk Assessment: Other exceptions (miami J collar)   Communication Communication Communication: No apparent difficulties   Cognition Arousal: Alert Behavior During Therapy: WFL for tasks assessed/performed Overall Cognitive Status: Impaired/Different from baseline Area of Impairment: Attention, Memory, Following commands, Safety/judgement, Awareness, Problem solving                   Current Attention Level: Focused Memory: Decreased short-term memory Following Commands: Follows one step commands with increased time Safety/Judgement: Decreased awareness of safety, Decreased awareness of deficits   Problem Solving: Slow processing, Difficulty sequencing, Requires verbal cues, Requires tactile cues General Comments: delayed responses, perseverative on having a ramp, and that her daughters live down the road     General Comments  VSS on RA    Exercises     Shoulder Instructions      Home Living Family/patient expects to be discharged to:: Private residence Living Arrangements: Alone Available Help at Discharge: Family;Available PRN/intermittently Type of Home: House Home Access: Ramped entrance;Stairs  to enter     Home Layout: One level     Bathroom Shower/Tub: Chief Strategy Officer: Standard Bathroom Accessibility: No   Home Equipment: Rollator (4 wheels)          Prior Functioning/Environment Prior Level of Function : Needs assist             Mobility Comments: uses walker ADLs Comments: reports ind        OT Problem List: Decreased strength;Decreased range of motion;Decreased activity tolerance;Impaired balance (sitting and/or standing);Impaired vision/perception;Decreased coordination;Decreased cognition;Decreased safety awareness;Cardiopulmonary status limiting activity;Impaired UE functional use;Impaired sensation      OT Treatment/Interventions: Self-care/ADL training;Therapeutic exercise;Energy conservation;DME and/or AE instruction;Therapeutic activities;Patient/family education;Balance training;Cognitive remediation/compensation;Visual/perceptual remediation/compensation    OT Goals(Current goals can be found in the care plan section) Acute Rehab OT Goals Patient Stated Goal: none stated OT Goal Formulation: With patient Time For Goal Achievement: 09/10/22 Potential to Achieve Goals: Good ADL Goals Pt Will Perform Upper Body Dressing: with mod assist;sitting;bed level Pt Will Perform Lower Body Dressing: with mod assist;sitting/lateral leans;bed level Pt Will Transfer to Toilet: with mod assist;with +2 assist;squat pivot transfer;stand pivot transfer;bedside commode Additional ADL Goal #1: pt will be able to sit EOB x5 min with min A in prep for seated ADLs Additional ADL Goal #2: Pt will demonstrate use of log roll technique for bed mobility with min A in prep for ADLs  OT Frequency: Min 1X/week    Co-evaluation PT/OT/SLP Co-Evaluation/Treatment: Yes Reason for Co-Treatment: Complexity of the patient's impairments (multi-system involvement);Necessary to address cognition/behavior during functional activity;To address functional/ADL  transfers PT goals addressed during session: Mobility/safety with mobility;Balance;Strengthening/ROM OT goals addressed during session: ADL's  and self-care;Strengthening/ROM      AM-PAC OT "6 Clicks" Daily Activity     Outcome Measure Help from another person eating meals?:  (npo) Help from another person taking care of personal grooming?: A Lot Help from another person toileting, which includes using toliet, bedpan, or urinal?: A Lot Help from another person bathing (including washing, rinsing, drying)?: A Lot Help from another person to put on and taking off regular upper body clothing?: A Lot Help from another person to put on and taking off regular lower body clothing?: A Lot 6 Click Score: 10   End of Session Equipment Utilized During Treatment: Cervical collar Nurse Communication: Mobility status  Activity Tolerance: Patient tolerated treatment well Patient left: in bed;with call bell/phone within reach;with bed alarm set;with family/visitor present;with nursing/sitter in room  OT Visit Diagnosis: Other abnormalities of gait and mobility (R26.89);Unsteadiness on feet (R26.81);Muscle weakness (generalized) (M62.81);Other symptoms and signs involving the nervous system (R29.898)                Time: 4098-1191 OT Time Calculation (min): 21 min Charges:  OT General Charges $OT Visit: 1 Visit OT Evaluation $OT Eval Moderate Complexity: 1 Mod  Kandiss Ihrig K, OTD, OTR/L SecureChat Preferred Acute Rehab [phone]   Carver Fila Koonce 08/27/2022, 12:45 PM

## 2022-08-27 NOTE — Progress Notes (Signed)
eLink Physician-Brief Progress Note Patient Name: Hannah [name] DOB: 1940/10/23 MRN: [id number]   Date of Service  08/27/2022  HPI/Events of Note  25 cc UO since 7pm   has foley and bladder scan was done  which showed no output  eICU Interventions  500 cc NS ordered to be given over 1 hour      Intervention Category Major Interventions: Hypovolemia - evaluation and treatment with fluids  Oretha Milch 08/27/2022, 11:28 PM  Addendum at 3:13 am - asked to renew NS order at 75 cc/hr/ Done.

## 2022-08-27 NOTE — Progress Notes (Signed)
Inpatient Rehab Admissions Coordinator Note:   Per therapy patient was screened for CIR candidacy by Mylo Choi Luvenia Starch, CCC-SLP. At this time, pt is not medically ready for CIR. Pt has pending surgery with neurosurgery this week. CIR admissions team will follow to monitor medical readiness and participation and progress with therapies. A consult order will be placed if pt appears to be an appropriate candidate.    Wolfgang Phoenix, MS, CCC-SLP Admissions Coordinator [phone] 08/27/22 4:24 PM

## 2022-08-27 NOTE — Progress Notes (Signed)
She is stable.  She is in a cervical collar.  She complains of neck pain.  She has poor sensation in the left hand with really no left hand movement but has wrist extension and elbow flexion.  On the right she can wiggle her fingers.  She has allodynia on the right hand.  I think the plan is for surgery later this week with Dr. Lovell Sheehan

## 2022-08-28 DIAGNOSIS — R8271 Bacteriuria: Secondary | ICD-10-CM | POA: Diagnosis not present

## 2022-08-28 DIAGNOSIS — Z86711 Personal history of pulmonary embolism: Secondary | ICD-10-CM | POA: Diagnosis not present

## 2022-08-28 DIAGNOSIS — R1312 Dysphagia, oropharyngeal phase: Secondary | ICD-10-CM | POA: Diagnosis not present

## 2022-08-28 DIAGNOSIS — W19XXXA Unspecified fall, initial encounter: Secondary | ICD-10-CM | POA: Diagnosis not present

## 2022-08-28 DIAGNOSIS — E785 Hyperlipidemia, unspecified: Secondary | ICD-10-CM | POA: Diagnosis not present

## 2022-08-28 DIAGNOSIS — S066X0A Traumatic subarachnoid hemorrhage without loss of consciousness, initial encounter: Secondary | ICD-10-CM | POA: Diagnosis not present

## 2022-08-28 DIAGNOSIS — S14122A Central cord syndrome at C2 level of cervical spinal cord, initial encounter: Secondary | ICD-10-CM | POA: Diagnosis not present

## 2022-08-28 DIAGNOSIS — E1140 Type 2 diabetes mellitus with diabetic neuropathy, unspecified: Secondary | ICD-10-CM | POA: Diagnosis not present

## 2022-08-28 DIAGNOSIS — S12000A Unspecified displaced fracture of first cervical vertebra, initial encounter for closed fracture: Secondary | ICD-10-CM | POA: Diagnosis not present

## 2022-08-28 DIAGNOSIS — R531 Weakness: Secondary | ICD-10-CM | POA: Diagnosis not present

## 2022-08-28 DIAGNOSIS — S12100A Unspecified displaced fracture of second cervical vertebra, initial encounter for closed fracture: Secondary | ICD-10-CM | POA: Diagnosis not present

## 2022-08-28 DIAGNOSIS — S14121A Central cord syndrome at C1 level of cervical spinal cord, initial encounter: Secondary | ICD-10-CM | POA: Diagnosis not present

## 2022-08-28 DIAGNOSIS — S12110A Anterior displaced Type II dens fracture, initial encounter for closed fracture: Secondary | ICD-10-CM | POA: Diagnosis not present

## 2022-08-28 DIAGNOSIS — E8722 Chronic metabolic acidosis: Secondary | ICD-10-CM | POA: Diagnosis not present

## 2022-08-28 DIAGNOSIS — Y33XXXA Other specified events, undetermined intent, initial encounter: Secondary | ICD-10-CM | POA: Diagnosis not present

## 2022-08-28 DIAGNOSIS — D696 Thrombocytopenia, unspecified: Secondary | ICD-10-CM | POA: Diagnosis not present

## 2022-08-28 DIAGNOSIS — S12120D Other displaced dens fracture, subsequent encounter for fracture with routine healing: Secondary | ICD-10-CM | POA: Diagnosis not present

## 2022-08-28 DIAGNOSIS — Z933 Colostomy status: Secondary | ICD-10-CM | POA: Diagnosis not present

## 2022-08-28 DIAGNOSIS — S12000D Unspecified displaced fracture of first cervical vertebra, subsequent encounter for fracture with routine healing: Secondary | ICD-10-CM | POA: Diagnosis not present

## 2022-08-28 DIAGNOSIS — S12090A Other displaced fracture of first cervical vertebra, initial encounter for closed fracture: Secondary | ICD-10-CM | POA: Diagnosis not present

## 2022-08-28 DIAGNOSIS — S14121D Central cord syndrome at C1 level of cervical spinal cord, subsequent encounter: Secondary | ICD-10-CM | POA: Diagnosis not present

## 2022-08-28 DIAGNOSIS — I2694 Multiple subsegmental pulmonary emboli without acute cor pulmonale: Secondary | ICD-10-CM | POA: Diagnosis not present

## 2022-08-28 DIAGNOSIS — M4802 Spinal stenosis, cervical region: Secondary | ICD-10-CM | POA: Diagnosis not present

## 2022-08-28 DIAGNOSIS — E1159 Type 2 diabetes mellitus with other circulatory complications: Secondary | ICD-10-CM | POA: Diagnosis not present

## 2022-08-28 DIAGNOSIS — E1122 Type 2 diabetes mellitus with diabetic chronic kidney disease: Secondary | ICD-10-CM | POA: Diagnosis not present

## 2022-08-28 DIAGNOSIS — R29818 Other symptoms and signs involving the nervous system: Secondary | ICD-10-CM | POA: Diagnosis not present

## 2022-08-28 DIAGNOSIS — Z01818 Encounter for other preprocedural examination: Secondary | ICD-10-CM | POA: Diagnosis not present

## 2022-08-28 DIAGNOSIS — S0990XA Unspecified injury of head, initial encounter: Secondary | ICD-10-CM | POA: Diagnosis not present

## 2022-08-28 DIAGNOSIS — I130 Hypertensive heart and chronic kidney disease with heart failure and stage 1 through stage 4 chronic kidney disease, or unspecified chronic kidney disease: Secondary | ICD-10-CM | POA: Diagnosis not present

## 2022-08-28 DIAGNOSIS — T794XXA Traumatic shock, initial encounter: Secondary | ICD-10-CM | POA: Insufficient documentation

## 2022-08-28 DIAGNOSIS — J9809 Other diseases of bronchus, not elsewhere classified: Secondary | ICD-10-CM | POA: Diagnosis not present

## 2022-08-28 DIAGNOSIS — Z8674 Personal history of sudden cardiac arrest: Secondary | ICD-10-CM | POA: Diagnosis not present

## 2022-08-28 DIAGNOSIS — E861 Hypovolemia: Secondary | ICD-10-CM | POA: Diagnosis not present

## 2022-08-28 DIAGNOSIS — I10 Essential (primary) hypertension: Secondary | ICD-10-CM | POA: Diagnosis not present

## 2022-08-28 DIAGNOSIS — Z95828 Presence of other vascular implants and grafts: Secondary | ICD-10-CM | POA: Diagnosis not present

## 2022-08-28 DIAGNOSIS — I2699 Other pulmonary embolism without acute cor pulmonale: Secondary | ICD-10-CM | POA: Diagnosis not present

## 2022-08-28 DIAGNOSIS — Y92009 Unspecified place in unspecified non-institutional (private) residence as the place of occurrence of the external cause: Secondary | ICD-10-CM | POA: Diagnosis not present

## 2022-08-28 DIAGNOSIS — R918 Other nonspecific abnormal finding of lung field: Secondary | ICD-10-CM | POA: Diagnosis not present

## 2022-08-28 DIAGNOSIS — E871 Hypo-osmolality and hyponatremia: Secondary | ICD-10-CM | POA: Diagnosis not present

## 2022-08-28 DIAGNOSIS — M62838 Other muscle spasm: Secondary | ICD-10-CM | POA: Diagnosis not present

## 2022-08-28 DIAGNOSIS — I480 Paroxysmal atrial fibrillation: Secondary | ICD-10-CM | POA: Diagnosis not present

## 2022-08-28 DIAGNOSIS — R109 Unspecified abdominal pain: Secondary | ICD-10-CM | POA: Diagnosis not present

## 2022-08-28 DIAGNOSIS — I428 Other cardiomyopathies: Secondary | ICD-10-CM | POA: Diagnosis not present

## 2022-08-28 DIAGNOSIS — J9601 Acute respiratory failure with hypoxia: Secondary | ICD-10-CM | POA: Diagnosis not present

## 2022-08-28 DIAGNOSIS — S1201XA Stable burst fracture of first cervical vertebra, initial encounter for closed fracture: Secondary | ICD-10-CM | POA: Diagnosis not present

## 2022-08-28 DIAGNOSIS — E039 Hypothyroidism, unspecified: Secondary | ICD-10-CM | POA: Diagnosis not present

## 2022-08-28 DIAGNOSIS — R339 Retention of urine, unspecified: Secondary | ICD-10-CM | POA: Diagnosis not present

## 2022-08-28 DIAGNOSIS — R0902 Hypoxemia: Secondary | ICD-10-CM | POA: Diagnosis not present

## 2022-08-28 DIAGNOSIS — Z452 Encounter for adjustment and management of vascular access device: Secondary | ICD-10-CM | POA: Diagnosis not present

## 2022-08-28 DIAGNOSIS — E46 Unspecified protein-calorie malnutrition: Secondary | ICD-10-CM | POA: Diagnosis not present

## 2022-08-28 DIAGNOSIS — R5381 Other malaise: Secondary | ICD-10-CM | POA: Diagnosis not present

## 2022-08-28 DIAGNOSIS — N183 Chronic kidney disease, stage 3 unspecified: Secondary | ICD-10-CM | POA: Diagnosis not present

## 2022-08-28 DIAGNOSIS — J811 Chronic pulmonary edema: Secondary | ICD-10-CM | POA: Diagnosis not present

## 2022-08-28 DIAGNOSIS — M1990 Unspecified osteoarthritis, unspecified site: Secondary | ICD-10-CM | POA: Diagnosis not present

## 2022-08-28 DIAGNOSIS — E0840 Diabetes mellitus due to underlying condition with diabetic neuropathy, unspecified: Secondary | ICD-10-CM | POA: Diagnosis not present

## 2022-08-28 DIAGNOSIS — Z87891 Personal history of nicotine dependence: Secondary | ICD-10-CM | POA: Diagnosis not present

## 2022-08-28 DIAGNOSIS — Z4682 Encounter for fitting and adjustment of non-vascular catheter: Secondary | ICD-10-CM | POA: Diagnosis not present

## 2022-08-28 DIAGNOSIS — W010XXA Fall on same level from slipping, tripping and stumbling without subsequent striking against object, initial encounter: Secondary | ICD-10-CM | POA: Diagnosis not present

## 2022-08-28 DIAGNOSIS — R52 Pain, unspecified: Secondary | ICD-10-CM | POA: Diagnosis not present

## 2022-08-28 DIAGNOSIS — J90 Pleural effusion, not elsewhere classified: Secondary | ICD-10-CM | POA: Diagnosis not present

## 2022-08-28 DIAGNOSIS — R931 Abnormal findings on diagnostic imaging of heart and coronary circulation: Secondary | ICD-10-CM | POA: Diagnosis not present

## 2022-08-28 DIAGNOSIS — I5040 Unspecified combined systolic (congestive) and diastolic (congestive) heart failure: Secondary | ICD-10-CM | POA: Diagnosis not present

## 2022-08-28 DIAGNOSIS — Z4659 Encounter for fitting and adjustment of other gastrointestinal appliance and device: Secondary | ICD-10-CM | POA: Diagnosis not present

## 2022-08-28 DIAGNOSIS — R279 Unspecified lack of coordination: Secondary | ICD-10-CM | POA: Diagnosis not present

## 2022-08-28 DIAGNOSIS — I959 Hypotension, unspecified: Secondary | ICD-10-CM | POA: Diagnosis not present

## 2022-08-28 DIAGNOSIS — S12111A Posterior displaced Type II dens fracture, initial encounter for closed fracture: Secondary | ICD-10-CM | POA: Diagnosis not present

## 2022-08-28 DIAGNOSIS — I5030 Unspecified diastolic (congestive) heart failure: Secondary | ICD-10-CM | POA: Diagnosis not present

## 2022-08-28 DIAGNOSIS — S14129A Central cord syndrome at unspecified level of cervical spinal cord, initial encounter: Secondary | ICD-10-CM | POA: Insufficient documentation

## 2022-08-28 DIAGNOSIS — J9811 Atelectasis: Secondary | ICD-10-CM | POA: Diagnosis not present

## 2022-08-28 DIAGNOSIS — I5042 Chronic combined systolic (congestive) and diastolic (congestive) heart failure: Secondary | ICD-10-CM | POA: Diagnosis not present

## 2022-08-28 DIAGNOSIS — Z7901 Long term (current) use of anticoagulants: Secondary | ICD-10-CM | POA: Diagnosis not present

## 2022-08-28 DIAGNOSIS — I272 Pulmonary hypertension, unspecified: Secondary | ICD-10-CM | POA: Diagnosis not present

## 2022-08-28 DIAGNOSIS — M6281 Muscle weakness (generalized): Secondary | ICD-10-CM | POA: Diagnosis not present

## 2022-08-28 LAB — BASIC METABOLIC PANEL
Anion gap: 7 (ref 5–15)
BUN: 24 mg/dL — ABNORMAL HIGH (ref 8–23)
CO2: 17 mmol/L — ABNORMAL LOW (ref 22–32)
Calcium: 8.1 mg/dL — ABNORMAL LOW (ref 8.9–10.3)
Chloride: 110 mmol/L (ref 98–111)
Creatinine, Ser: 1.34 mg/dL — ABNORMAL HIGH (ref 0.44–1.00)
GFR, Estimated: 40 mL/min — ABNORMAL LOW (ref >60)
Glucose, Bld: 167 mg/dL — ABNORMAL HIGH (ref 70–99)
Potassium: 4 mmol/L (ref 3.5–5.1)
Sodium: 134 mmol/L — ABNORMAL LOW (ref 135–145)

## 2022-08-28 LAB — GLUCOSE, CAPILLARY
Glucose-Capillary: 109 mg/dL — ABNORMAL HIGH (ref 70–99)
Glucose-Capillary: 113 mg/dL — ABNORMAL HIGH (ref 70–99)
Glucose-Capillary: 121 mg/dL — ABNORMAL HIGH (ref 70–99)
Glucose-Capillary: 123 mg/dL — ABNORMAL HIGH (ref 70–99)
Glucose-Capillary: 137 mg/dL — ABNORMAL HIGH (ref 70–99)

## 2022-08-28 LAB — MAGNESIUM: Magnesium: 2.4 mg/dL (ref 1.7–2.4)

## 2022-08-28 LAB — PHOSPHORUS: Phosphorus: 3 mg/dL (ref 2.5–4.6)

## 2022-08-28 MED ORDER — ONDANSETRON HCL 4 MG/2ML IJ SOLN: 4.0000 mg | Freq: Four times a day (QID) | INTRAMUSCULAR | Status: DC | PRN

## 2022-08-28 MED ORDER — SODIUM CHLORIDE 0.9 % IV SOLN: INTRAVENOUS | Status: DC

## 2022-08-28 MED ORDER — PANTOPRAZOLE SODIUM 40 MG IV SOLR
40.0000 mg | Freq: Every day | INTRAVENOUS | Status: DC
  Administered 2022-08-28: 40 mg via INTRAVENOUS
  Filled 2022-08-28: qty 10

## 2022-08-28 MED ORDER — PANTOPRAZOLE SODIUM 40 MG IV SOLR: 40.0000 mg | Freq: Every day | INTRAVENOUS | Status: DC

## 2022-08-28 MED ORDER — SODIUM BICARBONATE 650 MG PO TABS
650.0000 mg | ORAL_TABLET | Freq: Three times a day (TID) | ORAL | Status: DC
  Administered 2022-08-28: 650 mg via ORAL
  Filled 2022-08-28: qty 1

## 2022-08-28 MED ORDER — ACETAMINOPHEN 650 MG RE SUPP: 650.0000 mg | Freq: Four times a day (QID) | RECTAL | Status: DC | PRN

## 2022-08-28 MED ORDER — HYDROMORPHONE HCL 1 MG/ML IJ SOLN: 0.2500 mg | INTRAMUSCULAR | Status: DC | PRN

## 2022-08-28 MED ORDER — LACTATED RINGERS IV BOLUS
500.0000 mL | Freq: Once | INTRAVENOUS | Status: AC
  Administered 2022-08-28: 500 mL via INTRAVENOUS

## 2022-08-28 MED ORDER — ACETAMINOPHEN 325 MG PO TABS: 650.0000 mg | ORAL_TABLET | Freq: Four times a day (QID) | ORAL | Status: DC | PRN

## 2022-08-28 MED ORDER — NITROGLYCERIN 0.4 MG SL SUBL: 0.4000 mg | SUBLINGUAL_TABLET | SUBLINGUAL | Status: DC | PRN

## 2022-08-28 MED ORDER — NOREPINEPHRINE 4 MG/250ML-% IV SOLN: 0.0000 ug/min | INTRAVENOUS | Status: DC

## 2022-08-28 MED ORDER — SODIUM BICARBONATE 650 MG PO TABS: 650.0000 mg | ORAL_TABLET | Freq: Three times a day (TID) | ORAL | Status: DC

## 2022-08-28 MED ORDER — DULOXETINE HCL 30 MG PO CPEP: 30.0000 mg | ORAL_CAPSULE | Freq: Every day | ORAL | Status: DC

## 2022-08-28 NOTE — Discharge Summary (Addendum)
Physician Discharge Summary         Patient ID: Hannah [name] MRN: [id number] DOB/AGE: [birth year] 82 y.o.  Admit date: 08/24/2022 Discharge date: 08/28/2022  Discharge Diagnoses:    Active Hospital Problems   Diagnosis Date Noted   Subarachnoid hematoma (HCC) 08/24/2022   Central cord syndrome (HCC) 08/28/2022   Neurogenic shock due to traumatic injury (HCC) 08/28/2022   C1 cervical fracture (HCC) 08/25/2022   C2 cervical fracture (HCC) 08/25/2022   Supratherapeutic INR 08/25/2022   Fall at home, initial encounter 08/25/2022   High anion gap metabolic acidosis 08/25/2022   Lactic acidosis 08/25/2022   Chronic diastolic CHF (congestive heart failure) (HCC) 08/25/2022   Paroxysmal atrial fibrillation (HCC)    CKD stage 3a, GFR 45-59 ml/min (HCC) 12/15/2018   Acquired hypothyroidism 06/06/2013   Hyperlipidemia 02/25/2013   DM2 (diabetes mellitus, type 2) (HCC) 07/23/2012    Resolved Hospital Problems  No resolved problems to display.      Discharge summary    82 year old female with extensive past medical history is well-documented below who had a fall on 8/21 with associated neck pain and bilateral weakness. Imaging by CT showed with a C1-2 fracture and a small subarachnoid hemorrhage.  She has been evaluated by neurology and neurosurgery.  MRI obtained showed C1 and C2 fracture, ossification of posterior longitudinal ligament, cervical spondylosis, cervical spinal cord injury, central cord syndrome Of note she had a pulmonary embolism in the past which led to a PEA arrest currently she is on Coumadin 2.5 mg daily which has been reversed pulmonary critical care was called to the bedside 08/26/2022 for refractory hypotension.  She was started on IV hydration, Norepinephrine infusion and PICC was subsequently placed on 8/23 . It was felt by neuro surgery that she would need posterior cervical decompression, instrumentation and fusion from C1-C6 but it was felt she would be best  served by medically stabilizing and correcting coagulopathy prior surgery. Surgery was planned for mid week on 8/28. In the mean time her care has been supportive. It has consisted of maintaining mean arterial blood pressure w/ goal MAP > 85, providing analgesic support and C-spine immobilization. Her warfarin was held. On 8/25 critical care staff was approached by family who requested transfer to [facility] with request to be seen my neurosurgery there. She has since been accepted by Dr Andee Poles in the neuro ICU. She will be transferred to [facility] neuro ICU  PMH  PE, HFpEF, CKD 3a, DM type 2 with neuropathy, HLD, HTN, Hypothyroidism, PAF, Hypothyroidism   Discharge problem list   Hypotension. - likely hypovolemia and autonomic dysfunction in setting of C spine fracture Plan Cont NE for MAP > 85 day 4 of 7  Add midodrine Add compression stockings CVP goal 6-10 will cont MIVFs.    C1-2 fracture with central cord syndrome  trace traumatic SAH.  Plan per neurosurgery; will likely need surgery; aiming for next week  MAP goal as above  Add IS Adjusting analgesics to avoid over-sedation   Hx of PE complicated by PEA. Chronic HFpEF, HLD. - INR better Plan hold anticoagulation until okay with neurosurgery Cont lipitor  hold outpt lasix, toprol, entresto, aldactone   DM type 2 with peripheral neurolpathy. Plan Cont ssi, goal 140-180 Cont Cymbalta when able to take pills   Hx of hypothyroidism. Plan synthroid    CKD 3a. Scr stable (improved from 48 hrs ago) Urine retention. Plan Avoid hypotension  Renal dose meds Strict I&O Am chem  Keep foley in place  Fluid and Electrolyte imbalance: Non-gap metabolic acidosis. Plan Add bicarb replacement VT F/u chems    Dysphagia. Seen by  speech therapy, tolerating diet poorly  Plan Clear liquids adv as tolerated Further recs per SLP TBD Will likely need Cortrak    Significant Hospital tests/ studies  MRI C spine, CT head w/out contrast,  CT C spine all powershared to [facility]  Neurology  Neurosurgery     Discharge Exam: BP (!) 150/64   Pulse (!) 106   Temp (!) 96.9 F (36.1 C) (Axillary)   Resp (!) 9   Ht 5\' 7"  (1.702 m)   Wt 87.9 kg   SpO2 100%   BMI 30.35 kg/m   General 81 year old female resting in bed. Just got Dilaudid so sleepy HENT C Collar in place. MMM, Neuro awake. Oriented X 3. Has gross Bilateral upper extremity motor movement only. Can move Upper extremities w/ full assist. Only able to plantar flex both LEs. Pulm clear dec bases. Very weak cough  Card rrr Abd soft  Ext warm good pulses.   Labs at discharge   Lab Results  Component Value Date   CREATININE 1.34 (H) 08/28/2022   BUN 24 (H) 08/28/2022   NA 134 (L) 08/28/2022   K 4.0 08/28/2022   CL 110 08/28/2022   CO2 17 (L) 08/28/2022   Lab Results  Component Value Date   WBC 11.5 (H) 08/27/2022   HGB 13.7 08/27/2022   HCT 43.8 08/27/2022   MCV 95.8 08/27/2022   PLT 134 (L) 08/27/2022   Lab Results  Component Value Date   ALT 14 08/25/2022   AST 18 08/25/2022   ALKPHOS 48 08/25/2022   BILITOT 1.8 (H) 08/25/2022   Lab Results  Component Value Date   INR 1.0 08/26/2022   INR >10.0 (HH) 08/26/2022   INR 1.1 08/25/2022    Current radiological studies    ECHOCARDIOGRAM LIMITED  Result Date: 08/26/2022    ECHOCARDIOGRAM LIMITED REPORT   Patient Name:   Hannah [name] Date of Exam: 08/26/2022 Medical Rec #:  [id number]    Height:       67.0 in Accession #:    [id number]   Weight:       175.7 lb Date of Birth:  1940/09/22    BSA:          1.914 m Patient Age:    82 years     BP:           96/51 mmHg Patient Gender: F            HR:           93 bpm. Exam Location:  Inpatient Procedure: Limited Echo and Limited Color Doppler Indications:    CHF I50.9  History:        Patient has prior history of Echocardiogram examinations, most                 recent 08/10/2022. CHF and Cardiomyopathy, CAD and Previous                  Myocardial Infarction, Pulmonary HTN, Arrythmias:Atrial                 Fibrillation, Signs/Symptoms:Chest Pain; Risk                 Factors:Hypertension, Diabetes and Dyslipidemia. CKD, stage 3.  Sonographer:    Lucendia Herrlich Referring Phys: [id number] Dewayne Shorter M GHIMIRE IMPRESSIONS  1.  Left ventricular ejection fraction, by estimation, is 70 to 75%. The left ventricle has hyperdynamic function. The left ventricle has no regional wall motion abnormalities. Left ventricular diastolic parameters are consistent with Grade I diastolic dysfunction (impaired relaxation).  2. Right ventricular systolic function is normal. The right ventricular size is normal. There is moderately elevated pulmonary artery systolic pressure. The estimated right ventricular systolic pressure is 46.0 mmHg.  3. The mitral valve is normal in structure. Trivial mitral valve regurgitation. No evidence of mitral stenosis.  4. The aortic valve is tricuspid. There is mild calcification of the aortic valve. Aortic valve regurgitation is not visualized. No aortic stenosis is present.  5. The inferior vena cava is normal in size with greater than 50% respiratory variability, suggesting right atrial pressure of 3 mmHg. Conclusion(s)/Recommendation(s): The EF has normalized since previous echo. FINDINGS  Left Ventricle: Left ventricular ejection fraction, by estimation, is 70 to 75%. The left ventricle has hyperdynamic function. The left ventricle has no regional wall motion abnormalities. The left ventricular internal cavity size was normal in size. There is no left ventricular hypertrophy. Left ventricular diastolic parameters are consistent with Grade I diastolic dysfunction (impaired relaxation). Right Ventricle: The right ventricular size is normal. No increase in right ventricular wall thickness. Right ventricular systolic function is normal. There is moderately elevated pulmonary artery systolic pressure. The tricuspid regurgitant velocity is 3.20  m/s, and with an assumed right atrial pressure of 5 mmHg, the estimated right ventricular systolic pressure is 46.0 mmHg. Left Atrium: Left atrial size was normal in size. Right Atrium: Right atrial size was normal in size. Pericardium: There is no evidence of pericardial effusion. Mitral Valve: The mitral valve is normal in structure. Mild mitral annular calcification. Trivial mitral valve regurgitation. No evidence of mitral valve stenosis. Tricuspid Valve: The tricuspid valve is normal in structure. Tricuspid valve regurgitation is mild . No evidence of tricuspid stenosis. Aortic Valve: The aortic valve is tricuspid. There is mild calcification of the aortic valve. Aortic valve regurgitation is not visualized. No aortic stenosis is present. Pulmonic Valve: The pulmonic valve was normal in structure. Pulmonic valve regurgitation is not visualized. No evidence of pulmonic stenosis. Aorta: The aortic root is normal in size and structure. Venous: The inferior vena cava is normal in size with greater than 50% respiratory variability, suggesting right atrial pressure of 3 mmHg. IAS/Shunts: No atrial level shunt detected by color flow Doppler. LEFT VENTRICLE PLAX 2D LVIDd:         3.50 cm   Diastology LVIDs:         2.10 cm   LV e' medial:  5.59 cm/s LV PW:         0.90 cm   LV e' lateral: 9.48 cm/s LV IVS:        0.80 cm LVOT diam:     2.00 cm LVOT Area:     3.14 cm  RIGHT VENTRICLE             IVC RV S prime:     15.20 cm/s  IVC diam: 1.30 cm LEFT ATRIUM         Index LA diam:    2.50 cm 1.31 cm/m   AORTA Ao Root diam: 2.70 cm TRICUSPID VALVE TR Peak grad:   41.0 mmHg TR Vmax:        320.00 cm/s  SHUNTS Systemic Diam: 2.00 cm Arvilla Meres MD Electronically signed by Arvilla Meres MD Signature Date/Time: 08/26/2022/4:13:01 PM    Final  Disposition:    Discharge disposition: [facility]-Hospice/Medical Facility      Discharge Instructions     Diet clear liquid   Complete by: As directed    Increase  activity slowly   Complete by: As directed      to [facility] neuro ICU   Allergies as of 08/28/2022       Reactions   Augmentin [amoxicillin-pot Clavulanate] Itching, Other (See Comments)   Severe vaginal itching   Tranxene [clorazepate] Itching   Crestor [rosuvastatin]    Made her sick on stomach, she is able to tolerate the zocor   Penicillins Itching, Rash        Medication List     STOP taking these medications    ascorbic acid 500 MG tablet Commonly known as: VITAMIN C   atorvastatin 40 MG tablet Commonly known as: LIPITOR   cyanocobalamin 1000 MCG tablet Commonly known as: VITAMIN B12   dapagliflozin propanediol 10 MG Tabs tablet Commonly known as: Farxiga   Entresto 24-26 MG Generic drug: sacubitril-valsartan   furosemide 20 MG tablet Commonly known as: LASIX   metoprolol succinate 25 MG 24 hr tablet Commonly known as: Toprol XL   OneTouch Delica Lancets 30G Misc   OneTouch Verio test strip Generic drug: glucose blood   pantoprazole 40 MG tablet Commonly known as: PROTONIX Replaced by: pantoprazole 40 MG injection   spironolactone 25 MG tablet Commonly known as: ALDACTONE   Voltaren Arthritis Pain 1 % Gel Generic drug: diclofenac Sodium   warfarin 2.5 MG tablet Commonly known as: COUMADIN       TAKE these medications    acetaminophen 325 MG tablet Commonly known as: TYLENOL Take 2 tablets (650 mg total) by mouth every 6 (six) hours as needed for mild pain (or Fever >/= 101). What changed:  medication strength how much to take reasons to take this   acetaminophen 650 MG suppository Commonly known as: TYLENOL Place 1 suppository (650 mg total) rectally every 6 (six) hours as needed for mild pain (or Fever >/= 101). What changed: You were already taking a medication with the same name, and this prescription was added. Make sure you understand how and when to take each.   DULoxetine 30 MG capsule Commonly known as: Cymbalta Take 1  capsule (30 mg total) by mouth daily. Start taking on: August 29, 2022   HYDROmorphone 1 MG/ML injection Commonly known as: DILAUDID Inject 0.25 mLs (0.25 mg total) into the vein every 2 (two) hours as needed for severe pain.   levothyroxine 25 MCG tablet Commonly known as: SYNTHROID Take 25 mcg by mouth daily before breakfast.   nitroGLYCERIN 0.4 MG SL tablet Commonly known as: NITROSTAT Place 1 tablet (0.4 mg total) under the tongue every 5 (five) minutes as needed for chest pain.   norepinephrine 4-5 MG/250ML-% Soln Commonly known as: LEVOPHED Inject 0-40 mcg/min into the vein continuous.   ondansetron 4 MG/2ML Soln injection Commonly known as: ZOFRAN Inject 2 mLs (4 mg total) into the vein every 6 (six) hours as needed for nausea or vomiting.   pantoprazole 40 MG injection Commonly known as: PROTONIX Inject 40 mg into the vein daily. Replaces: pantoprazole 40 MG tablet   sodium bicarbonate 650 MG tablet Take 1 tablet (650 mg total) by mouth 3 (three) times daily.         Follow-up appointment   TBD Discharge Condition:    stable  Physician Statement:   The Patient was personally examined, the discharge assessment and plan  has been personally reviewed and I agree with ACNP Sharnita Bogucki's assessment and plan. 43 min  minutes of time have been dedicated to discharge assessment, planning and discharge instructions.   Signed: Shelby Mattocks 08/28/2022, 3:13 PM

## 2022-08-28 NOTE — Progress Notes (Addendum)
NAME:  Hannah [name], MRN:  [id number], DOB:  04-07-1940, LOS: 4 ADMISSION DATE:  08/24/2022, CONSULTATION DATE: 08/26/2022 REFERRING MD: [facility], CHIEF COMPLAINT: Cervical fracture hypotension  History of Present Illness:  82 year old female [place] extensive past medical history is well-documented below who had a fall with a C1-2 fracture and a small subarachnoid hemorrhage bilateral extremity weakness for which she was admitted to [facility].  She has been evaluated by neurology and neurosurgery.  Of note she had a pulmonary embolism in the past which led to a PEA arrest currently she is on Coumadin 2.5 mg daily which has been reversed pulmonary critical care was called to the bedside 08/26/2022 for refractory hypotension.  On exam she will be transferred to the intensive care unit we have requested 4 [place]. She may need a femoral central line and a femoral arterial line placed in the near future to actually maintain blood pressure readings and to supply her with fluids and vasopressors.  Pertinent  Medical History  PE, HFpEF, CKD 3a, DM type 2 with neuropathy, HLD, HTN, Hypothyroidism, PAF, Hypothyroidism  Significant Hospital Events: Including procedures, antibiotic start and stop dates in addition to other pertinent events   8/21 fall at home, admit, neurosurgery consulted 8/23 hypotension >> transferred to intensive care unit 8/24 started on cl liq diet  8/25 family inquiring about transfer to [facility]. Concerned about current level of communication. They are in contact w/ [facility]. Currently deciding on if they want [place] to request transfer. Continue MAP goal for 85. Added midodrine.  Interim History / Subjective:  Still having sig neck and back pain. Does get fairly sedated after dilaudid  Objective   Blood pressure (!) 107/55, pulse 83, temperature (!) 97.3 F (36.3 C), temperature source Oral, resp. rate 13, height 5\' 7"  (1.702 m), weight 87.9 kg, SpO2 100%. CVP:  [3 mmHg-7 mmHg] 3 mmHg       Intake/Output Summary (Last 24 hours) at 08/28/2022 0848 Last data filed at 08/28/2022 0700 Gross per 24 hour  Intake 2290.72 ml  Output 450 ml  Net 1840.72 ml   Filed Weights   08/26/22 0500 08/26/22 1115 08/28/22 0500  Weight: 81.5 kg 79.7 kg 87.9 kg    Examination:  General 82 year old female resting in bed. Just got Dilaudid so sleepy HENT C Collar in place. MMM, Neuro awake. Oriented X 3. Has gross Bilateral upper extremity motor movement only. Can move Upper extremities w/ full assist. Only able to plantar flex both LEs. Pulm clear dec bases. Very weak cough  Card rrr Abd soft  Ext warm good pulses.    Resolved Hospital Problem list     Assessment & Plan:   Hypotension. - likely hypovolemia and autonomic dysfunction in setting of C spine fracture Plan Cont NE for MAP > 85 day 4 of 7  Add midodrine Add compression stockings CVP goal 6-10 will cont MIVFs.   C1-2 fracture with central cord syndrome  trace traumatic SAH.  Plan per neurosurgery; will likely need surgery; aiming for next week  MAP goal as above  Add IS Adjusting analgesics to avoid over-sedation  Hx of PE complicated by PEA. Chronic HFpEF, HLD. - INR bette Plan hold anticoagulation until okay with neurosurgery Cont lipitor  hold outpt lasix, toprol, entresto, aldactone   DM type 2 with peripheral neurolpathy. Plan Cont ssi, goal 140-180 Cont Cymbalta when able to take pills   Hx of hypothyroidism. Plan synthroid   CKD 3a. Scr stable (improved from 48 hrs  ago) Urine retention. Plan Avoid hypotension  Renal dose meds Strict I&O Am chem  Keep foley in place   Fluid and Electrolyte imbalance: Non-gap metabolic acidosis. Plan Add bicarb replacement VT F/u chems   Dysphagia. Seen by  speech therapy Plan Clear liquids adv as tolerated Further recs per SLP TBD Will likely need Cortrak   Best Practice (right click and "Reselect all SmartList Selections" daily)   Diet/type:  clear liquids DVT prophylaxis: SCD GI prophylaxis: PPI Lines: N/A Foley:  N/A Code Status:  full code Last date of multidisciplinary goals of care discussion [tbd]   08/28/2022, 8:48 AM    45 minutes Critical care time  Simonne Martinet ACNP-BC [facility] Pulmonary/Critical Care Pager # [phone] OR # [phone] if no answer

## 2022-08-28 NOTE — Progress Notes (Signed)
Pt. transport to [facility] via [facility]. Report called, given to receiving nurse Verdon Cummins, RN @ [phone].

## 2022-08-28 NOTE — Progress Notes (Signed)
Patient is stable.  Can wiggle her toes.  Can wiggle fingers on the right hand.  No hand movement on the left.  Has weak wrist extension on the left but biceps are okay.  This is all stable.  Remains in her cervical collar.  Family requested transfer to [facility] for "optimal care."

## 2022-08-29 DIAGNOSIS — Z7901 Long term (current) use of anticoagulants: Secondary | ICD-10-CM | POA: Diagnosis not present

## 2022-08-29 DIAGNOSIS — Y92009 Unspecified place in unspecified non-institutional (private) residence as the place of occurrence of the external cause: Secondary | ICD-10-CM | POA: Diagnosis not present

## 2022-08-29 DIAGNOSIS — S0990XA Unspecified injury of head, initial encounter: Secondary | ICD-10-CM | POA: Diagnosis not present

## 2022-08-29 DIAGNOSIS — S14122A Central cord syndrome at C2 level of cervical spinal cord, initial encounter: Secondary | ICD-10-CM | POA: Diagnosis not present

## 2022-08-29 DIAGNOSIS — S12000A Unspecified displaced fracture of first cervical vertebra, initial encounter for closed fracture: Secondary | ICD-10-CM | POA: Diagnosis not present

## 2022-08-29 DIAGNOSIS — S12100A Unspecified displaced fracture of second cervical vertebra, initial encounter for closed fracture: Secondary | ICD-10-CM | POA: Diagnosis not present

## 2022-08-29 DIAGNOSIS — E1159 Type 2 diabetes mellitus with other circulatory complications: Secondary | ICD-10-CM | POA: Diagnosis not present

## 2022-08-29 DIAGNOSIS — W010XXA Fall on same level from slipping, tripping and stumbling without subsequent striking against object, initial encounter: Secondary | ICD-10-CM | POA: Diagnosis not present

## 2022-08-29 DIAGNOSIS — N183 Chronic kidney disease, stage 3 unspecified: Secondary | ICD-10-CM | POA: Diagnosis not present

## 2022-08-29 DIAGNOSIS — I959 Hypotension, unspecified: Secondary | ICD-10-CM | POA: Diagnosis not present

## 2022-08-29 DIAGNOSIS — J9601 Acute respiratory failure with hypoxia: Secondary | ICD-10-CM | POA: Diagnosis not present

## 2022-08-29 DIAGNOSIS — E039 Hypothyroidism, unspecified: Secondary | ICD-10-CM | POA: Diagnosis not present

## 2022-08-29 DIAGNOSIS — S1201XA Stable burst fracture of first cervical vertebra, initial encounter for closed fracture: Secondary | ICD-10-CM | POA: Diagnosis not present

## 2022-08-29 DIAGNOSIS — R1312 Dysphagia, oropharyngeal phase: Secondary | ICD-10-CM | POA: Diagnosis not present

## 2022-08-29 DIAGNOSIS — I5030 Unspecified diastolic (congestive) heart failure: Secondary | ICD-10-CM | POA: Diagnosis not present

## 2022-08-29 DIAGNOSIS — S14121A Central cord syndrome at C1 level of cervical spinal cord, initial encounter: Secondary | ICD-10-CM | POA: Diagnosis not present

## 2022-08-29 DIAGNOSIS — E1122 Type 2 diabetes mellitus with diabetic chronic kidney disease: Secondary | ICD-10-CM | POA: Diagnosis not present

## 2022-08-29 DIAGNOSIS — S066X0A Traumatic subarachnoid hemorrhage without loss of consciousness, initial encounter: Secondary | ICD-10-CM | POA: Diagnosis not present

## 2022-08-30 DIAGNOSIS — S1201XA Stable burst fracture of first cervical vertebra, initial encounter for closed fracture: Secondary | ICD-10-CM | POA: Diagnosis not present

## 2022-08-30 DIAGNOSIS — S12110A Anterior displaced Type II dens fracture, initial encounter for closed fracture: Secondary | ICD-10-CM | POA: Diagnosis not present

## 2022-08-30 DIAGNOSIS — S12111A Posterior displaced Type II dens fracture, initial encounter for closed fracture: Secondary | ICD-10-CM | POA: Diagnosis not present

## 2022-08-30 DIAGNOSIS — S14122A Central cord syndrome at C2 level of cervical spinal cord, initial encounter: Secondary | ICD-10-CM | POA: Diagnosis not present

## 2022-08-30 DIAGNOSIS — I5030 Unspecified diastolic (congestive) heart failure: Secondary | ICD-10-CM | POA: Diagnosis not present

## 2022-08-30 DIAGNOSIS — Z7901 Long term (current) use of anticoagulants: Secondary | ICD-10-CM | POA: Diagnosis not present

## 2022-08-30 DIAGNOSIS — E1159 Type 2 diabetes mellitus with other circulatory complications: Secondary | ICD-10-CM | POA: Diagnosis not present

## 2022-08-30 DIAGNOSIS — W010XXA Fall on same level from slipping, tripping and stumbling without subsequent striking against object, initial encounter: Secondary | ICD-10-CM | POA: Diagnosis not present

## 2022-08-30 DIAGNOSIS — D696 Thrombocytopenia, unspecified: Secondary | ICD-10-CM | POA: Diagnosis not present

## 2022-08-30 DIAGNOSIS — R1312 Dysphagia, oropharyngeal phase: Secondary | ICD-10-CM | POA: Diagnosis not present

## 2022-08-30 DIAGNOSIS — E039 Hypothyroidism, unspecified: Secondary | ICD-10-CM | POA: Diagnosis not present

## 2022-08-30 DIAGNOSIS — S066X0A Traumatic subarachnoid hemorrhage without loss of consciousness, initial encounter: Secondary | ICD-10-CM | POA: Diagnosis not present

## 2022-08-30 DIAGNOSIS — E1122 Type 2 diabetes mellitus with diabetic chronic kidney disease: Secondary | ICD-10-CM | POA: Diagnosis not present

## 2022-08-30 DIAGNOSIS — R918 Other nonspecific abnormal finding of lung field: Secondary | ICD-10-CM | POA: Diagnosis not present

## 2022-08-30 DIAGNOSIS — M4802 Spinal stenosis, cervical region: Secondary | ICD-10-CM | POA: Diagnosis not present

## 2022-08-30 DIAGNOSIS — Z4682 Encounter for fitting and adjustment of non-vascular catheter: Secondary | ICD-10-CM | POA: Diagnosis not present

## 2022-08-30 DIAGNOSIS — J90 Pleural effusion, not elsewhere classified: Secondary | ICD-10-CM | POA: Diagnosis not present

## 2022-08-30 DIAGNOSIS — I959 Hypotension, unspecified: Secondary | ICD-10-CM | POA: Diagnosis not present

## 2022-08-30 DIAGNOSIS — S14121A Central cord syndrome at C1 level of cervical spinal cord, initial encounter: Secondary | ICD-10-CM | POA: Diagnosis not present

## 2022-08-30 DIAGNOSIS — J9601 Acute respiratory failure with hypoxia: Secondary | ICD-10-CM | POA: Diagnosis not present

## 2022-08-30 DIAGNOSIS — N183 Chronic kidney disease, stage 3 unspecified: Secondary | ICD-10-CM | POA: Diagnosis not present

## 2022-08-30 DIAGNOSIS — W19XXXA Unspecified fall, initial encounter: Secondary | ICD-10-CM | POA: Diagnosis not present

## 2022-08-30 DIAGNOSIS — R931 Abnormal findings on diagnostic imaging of heart and coronary circulation: Secondary | ICD-10-CM | POA: Diagnosis not present

## 2022-08-30 DIAGNOSIS — J9811 Atelectasis: Secondary | ICD-10-CM | POA: Diagnosis not present

## 2022-08-31 ENCOUNTER — Inpatient Hospital Stay: Admit: 2022-08-31 | Payer: Medicare Other | Admitting: Neurosurgery

## 2022-08-31 DIAGNOSIS — S14121A Central cord syndrome at C1 level of cervical spinal cord, initial encounter: Secondary | ICD-10-CM | POA: Diagnosis not present

## 2022-08-31 DIAGNOSIS — S066X0A Traumatic subarachnoid hemorrhage without loss of consciousness, initial encounter: Secondary | ICD-10-CM | POA: Diagnosis not present

## 2022-08-31 DIAGNOSIS — J811 Chronic pulmonary edema: Secondary | ICD-10-CM | POA: Diagnosis not present

## 2022-08-31 DIAGNOSIS — I959 Hypotension, unspecified: Secondary | ICD-10-CM | POA: Diagnosis not present

## 2022-08-31 DIAGNOSIS — Z4682 Encounter for fitting and adjustment of non-vascular catheter: Secondary | ICD-10-CM | POA: Diagnosis not present

## 2022-08-31 DIAGNOSIS — I5030 Unspecified diastolic (congestive) heart failure: Secondary | ICD-10-CM | POA: Diagnosis not present

## 2022-08-31 DIAGNOSIS — S14122A Central cord syndrome at C2 level of cervical spinal cord, initial encounter: Secondary | ICD-10-CM | POA: Diagnosis not present

## 2022-08-31 DIAGNOSIS — R1312 Dysphagia, oropharyngeal phase: Secondary | ICD-10-CM | POA: Diagnosis not present

## 2022-08-31 DIAGNOSIS — W010XXA Fall on same level from slipping, tripping and stumbling without subsequent striking against object, initial encounter: Secondary | ICD-10-CM | POA: Diagnosis not present

## 2022-08-31 DIAGNOSIS — Z4659 Encounter for fitting and adjustment of other gastrointestinal appliance and device: Secondary | ICD-10-CM | POA: Diagnosis not present

## 2022-08-31 DIAGNOSIS — N183 Chronic kidney disease, stage 3 unspecified: Secondary | ICD-10-CM | POA: Diagnosis not present

## 2022-08-31 DIAGNOSIS — J90 Pleural effusion, not elsewhere classified: Secondary | ICD-10-CM | POA: Diagnosis not present

## 2022-08-31 DIAGNOSIS — R931 Abnormal findings on diagnostic imaging of heart and coronary circulation: Secondary | ICD-10-CM | POA: Diagnosis not present

## 2022-08-31 DIAGNOSIS — S1201XA Stable burst fracture of first cervical vertebra, initial encounter for closed fracture: Secondary | ICD-10-CM | POA: Diagnosis not present

## 2022-08-31 DIAGNOSIS — R918 Other nonspecific abnormal finding of lung field: Secondary | ICD-10-CM | POA: Diagnosis not present

## 2022-08-31 SURGERY — POSTERIOR CERVICAL FUSION/FORAMINOTOMY LEVEL 5
Anesthesia: General

## 2022-09-01 DIAGNOSIS — S14121A Central cord syndrome at C1 level of cervical spinal cord, initial encounter: Secondary | ICD-10-CM | POA: Diagnosis not present

## 2022-09-01 DIAGNOSIS — R1312 Dysphagia, oropharyngeal phase: Secondary | ICD-10-CM | POA: Diagnosis not present

## 2022-09-01 DIAGNOSIS — W010XXA Fall on same level from slipping, tripping and stumbling without subsequent striking against object, initial encounter: Secondary | ICD-10-CM | POA: Diagnosis not present

## 2022-09-01 DIAGNOSIS — S1201XA Stable burst fracture of first cervical vertebra, initial encounter for closed fracture: Secondary | ICD-10-CM | POA: Diagnosis not present

## 2022-09-02 ENCOUNTER — Telehealth: Payer: Self-pay

## 2022-09-02 DIAGNOSIS — R1312 Dysphagia, oropharyngeal phase: Secondary | ICD-10-CM | POA: Diagnosis not present

## 2022-09-02 DIAGNOSIS — Z4682 Encounter for fitting and adjustment of non-vascular catheter: Secondary | ICD-10-CM | POA: Diagnosis not present

## 2022-09-02 NOTE — Telephone Encounter (Signed)
Spoke to Dr. Lequita Halt and she stated that her family wasn't aware of the medication she was taking so she called to confirmed.

## 2022-09-03 DIAGNOSIS — W19XXXA Unspecified fall, initial encounter: Secondary | ICD-10-CM | POA: Diagnosis not present

## 2022-09-03 DIAGNOSIS — S12000A Unspecified displaced fracture of first cervical vertebra, initial encounter for closed fracture: Secondary | ICD-10-CM | POA: Diagnosis not present

## 2022-09-09 ENCOUNTER — Ambulatory Visit: Payer: Medicare Other

## 2022-09-09 DIAGNOSIS — J811 Chronic pulmonary edema: Secondary | ICD-10-CM | POA: Diagnosis not present

## 2022-09-10 DIAGNOSIS — J90 Pleural effusion, not elsewhere classified: Secondary | ICD-10-CM | POA: Diagnosis not present

## 2022-09-12 ENCOUNTER — Telehealth: Payer: Self-pay

## 2022-09-12 NOTE — Telephone Encounter (Signed)
I spoke with Joni Reining, pharmacist discussing pt's Warfarin dose.  The patient is presently in hospital and the pharmacist would like to reduce dose to 1 tablet Daily, except 0.5 tablet on Monday, Wednesday and Friday.  They will continue monitoring INR

## 2022-09-13 DIAGNOSIS — J9811 Atelectasis: Secondary | ICD-10-CM | POA: Diagnosis not present

## 2022-09-16 DIAGNOSIS — I959 Hypotension, unspecified: Secondary | ICD-10-CM | POA: Diagnosis not present

## 2022-09-19 ENCOUNTER — Ambulatory Visit: Payer: Medicare Other | Admitting: Internal Medicine

## 2022-09-20 DIAGNOSIS — R1312 Dysphagia, oropharyngeal phase: Secondary | ICD-10-CM | POA: Diagnosis not present

## 2022-09-20 DIAGNOSIS — R918 Other nonspecific abnormal finding of lung field: Secondary | ICD-10-CM | POA: Diagnosis not present

## 2022-09-21 DIAGNOSIS — Z01818 Encounter for other preprocedural examination: Secondary | ICD-10-CM | POA: Diagnosis not present

## 2022-09-21 DIAGNOSIS — R1312 Dysphagia, oropharyngeal phase: Secondary | ICD-10-CM | POA: Diagnosis not present

## 2022-09-22 DIAGNOSIS — R1312 Dysphagia, oropharyngeal phase: Secondary | ICD-10-CM | POA: Diagnosis not present

## 2022-09-23 DIAGNOSIS — J9809 Other diseases of bronchus, not elsewhere classified: Secondary | ICD-10-CM | POA: Diagnosis not present

## 2022-09-23 DIAGNOSIS — I2694 Multiple subsegmental pulmonary emboli without acute cor pulmonale: Secondary | ICD-10-CM | POA: Diagnosis not present

## 2022-09-23 DIAGNOSIS — J9811 Atelectasis: Secondary | ICD-10-CM | POA: Diagnosis not present

## 2022-09-23 DIAGNOSIS — R8271 Bacteriuria: Secondary | ICD-10-CM | POA: Diagnosis not present

## 2022-09-23 DIAGNOSIS — R29818 Other symptoms and signs involving the nervous system: Secondary | ICD-10-CM | POA: Diagnosis not present

## 2022-09-23 DIAGNOSIS — I2699 Other pulmonary embolism without acute cor pulmonale: Secondary | ICD-10-CM | POA: Diagnosis not present

## 2022-09-23 DIAGNOSIS — S14121A Central cord syndrome at C1 level of cervical spinal cord, initial encounter: Secondary | ICD-10-CM | POA: Diagnosis not present

## 2022-09-23 DIAGNOSIS — Y33XXXA Other specified events, undetermined intent, initial encounter: Secondary | ICD-10-CM | POA: Diagnosis not present

## 2022-09-23 DIAGNOSIS — R0902 Hypoxemia: Secondary | ICD-10-CM | POA: Diagnosis not present

## 2022-09-23 DIAGNOSIS — R1312 Dysphagia, oropharyngeal phase: Secondary | ICD-10-CM | POA: Diagnosis not present

## 2022-09-24 DIAGNOSIS — S14121A Central cord syndrome at C1 level of cervical spinal cord, initial encounter: Secondary | ICD-10-CM | POA: Diagnosis not present

## 2022-09-24 DIAGNOSIS — M62838 Other muscle spasm: Secondary | ICD-10-CM | POA: Diagnosis not present

## 2022-09-24 DIAGNOSIS — I959 Hypotension, unspecified: Secondary | ICD-10-CM | POA: Diagnosis not present

## 2022-09-25 DIAGNOSIS — R109 Unspecified abdominal pain: Secondary | ICD-10-CM | POA: Diagnosis not present

## 2022-09-25 DIAGNOSIS — S14121A Central cord syndrome at C1 level of cervical spinal cord, initial encounter: Secondary | ICD-10-CM | POA: Diagnosis not present

## 2022-09-25 DIAGNOSIS — R1312 Dysphagia, oropharyngeal phase: Secondary | ICD-10-CM | POA: Diagnosis not present

## 2022-09-25 DIAGNOSIS — Z452 Encounter for adjustment and management of vascular access device: Secondary | ICD-10-CM | POA: Diagnosis not present

## 2022-09-26 ENCOUNTER — Telehealth: Payer: Self-pay | Admitting: Licensed Clinical Social Worker

## 2022-09-26 DIAGNOSIS — E871 Hypo-osmolality and hyponatremia: Secondary | ICD-10-CM | POA: Diagnosis not present

## 2022-09-26 DIAGNOSIS — S14121D Central cord syndrome at C1 level of cervical spinal cord, subsequent encounter: Secondary | ICD-10-CM | POA: Diagnosis not present

## 2022-09-27 DIAGNOSIS — E0840 Diabetes mellitus due to underlying condition with diabetic neuropathy, unspecified: Secondary | ICD-10-CM | POA: Diagnosis not present

## 2022-09-27 DIAGNOSIS — S13101A Dislocation of unspecified cervical vertebrae, initial encounter: Secondary | ICD-10-CM | POA: Diagnosis not present

## 2022-09-27 DIAGNOSIS — T82838A Hemorrhage of vascular prosthetic devices, implants and grafts, initial encounter: Secondary | ICD-10-CM | POA: Diagnosis not present

## 2022-09-27 DIAGNOSIS — R578 Other shock: Secondary | ICD-10-CM | POA: Diagnosis not present

## 2022-09-27 DIAGNOSIS — R531 Weakness: Secondary | ICD-10-CM | POA: Diagnosis not present

## 2022-09-27 DIAGNOSIS — R0902 Hypoxemia: Secondary | ICD-10-CM | POA: Diagnosis not present

## 2022-09-27 DIAGNOSIS — Z7401 Bed confinement status: Secondary | ICD-10-CM | POA: Diagnosis not present

## 2022-09-27 DIAGNOSIS — R579 Shock, unspecified: Secondary | ICD-10-CM | POA: Diagnosis present

## 2022-09-27 DIAGNOSIS — S12111A Posterior displaced Type II dens fracture, initial encounter for closed fracture: Secondary | ICD-10-CM | POA: Diagnosis not present

## 2022-09-27 DIAGNOSIS — R0989 Other specified symptoms and signs involving the circulatory and respiratory systems: Secondary | ICD-10-CM | POA: Diagnosis not present

## 2022-09-27 DIAGNOSIS — G9341 Metabolic encephalopathy: Secondary | ICD-10-CM | POA: Diagnosis not present

## 2022-09-27 DIAGNOSIS — E669 Obesity, unspecified: Secondary | ICD-10-CM | POA: Diagnosis present

## 2022-09-27 DIAGNOSIS — E785 Hyperlipidemia, unspecified: Secondary | ICD-10-CM | POA: Diagnosis not present

## 2022-09-27 DIAGNOSIS — Z4801 Encounter for change or removal of surgical wound dressing: Secondary | ICD-10-CM | POA: Diagnosis not present

## 2022-09-27 DIAGNOSIS — L89322 Pressure ulcer of left buttock, stage 2: Secondary | ICD-10-CM | POA: Diagnosis not present

## 2022-09-27 DIAGNOSIS — N183 Chronic kidney disease, stage 3 unspecified: Secondary | ICD-10-CM | POA: Diagnosis not present

## 2022-09-27 DIAGNOSIS — S12000D Unspecified displaced fracture of first cervical vertebra, subsequent encounter for fracture with routine healing: Secondary | ICD-10-CM | POA: Diagnosis not present

## 2022-09-27 DIAGNOSIS — S1202XD Unstable burst fracture of first cervical vertebra, subsequent encounter for fracture with routine healing: Secondary | ICD-10-CM | POA: Diagnosis not present

## 2022-09-27 DIAGNOSIS — L89312 Pressure ulcer of right buttock, stage 2: Secondary | ICD-10-CM | POA: Diagnosis not present

## 2022-09-27 DIAGNOSIS — R1312 Dysphagia, oropharyngeal phase: Secondary | ICD-10-CM | POA: Diagnosis not present

## 2022-09-27 DIAGNOSIS — I5032 Chronic diastolic (congestive) heart failure: Secondary | ICD-10-CM | POA: Diagnosis not present

## 2022-09-27 DIAGNOSIS — D649 Anemia, unspecified: Secondary | ICD-10-CM | POA: Diagnosis not present

## 2022-09-27 DIAGNOSIS — I130 Hypertensive heart and chronic kidney disease with heart failure and stage 1 through stage 4 chronic kidney disease, or unspecified chronic kidney disease: Secondary | ICD-10-CM | POA: Diagnosis not present

## 2022-09-27 DIAGNOSIS — Z933 Colostomy status: Secondary | ICD-10-CM | POA: Diagnosis not present

## 2022-09-27 DIAGNOSIS — R58 Hemorrhage, not elsewhere classified: Secondary | ICD-10-CM | POA: Diagnosis not present

## 2022-09-27 DIAGNOSIS — S13101D Dislocation of unspecified cervical vertebrae, subsequent encounter: Secondary | ICD-10-CM | POA: Diagnosis not present

## 2022-09-27 DIAGNOSIS — I5040 Unspecified combined systolic (congestive) and diastolic (congestive) heart failure: Secondary | ICD-10-CM | POA: Diagnosis not present

## 2022-09-27 DIAGNOSIS — E1122 Type 2 diabetes mellitus with diabetic chronic kidney disease: Secondary | ICD-10-CM | POA: Diagnosis not present

## 2022-09-27 DIAGNOSIS — R5381 Other malaise: Secondary | ICD-10-CM | POA: Diagnosis not present

## 2022-09-27 DIAGNOSIS — S14121D Central cord syndrome at C1 level of cervical spinal cord, subsequent encounter: Secondary | ICD-10-CM | POA: Diagnosis not present

## 2022-09-27 DIAGNOSIS — R339 Retention of urine, unspecified: Secondary | ICD-10-CM | POA: Diagnosis not present

## 2022-09-27 DIAGNOSIS — I82621 Acute embolism and thrombosis of deep veins of right upper extremity: Secondary | ICD-10-CM | POA: Diagnosis not present

## 2022-09-27 DIAGNOSIS — S12000A Unspecified displaced fracture of first cervical vertebra, initial encounter for closed fracture: Secondary | ICD-10-CM | POA: Diagnosis not present

## 2022-09-27 DIAGNOSIS — S12120D Other displaced dens fracture, subsequent encounter for fracture with routine healing: Secondary | ICD-10-CM | POA: Diagnosis not present

## 2022-09-27 DIAGNOSIS — L89310 Pressure ulcer of right buttock, unstageable: Secondary | ICD-10-CM | POA: Diagnosis not present

## 2022-09-27 DIAGNOSIS — N1831 Chronic kidney disease, stage 3a: Secondary | ICD-10-CM | POA: Diagnosis not present

## 2022-09-27 DIAGNOSIS — R7889 Finding of other specified substances, not normally found in blood: Secondary | ICD-10-CM | POA: Diagnosis not present

## 2022-09-27 DIAGNOSIS — S12101A Unspecified nondisplaced fracture of second cervical vertebra, initial encounter for closed fracture: Secondary | ICD-10-CM | POA: Diagnosis not present

## 2022-09-27 DIAGNOSIS — J90 Pleural effusion, not elsewhere classified: Secondary | ICD-10-CM | POA: Diagnosis not present

## 2022-09-27 DIAGNOSIS — Z95828 Presence of other vascular implants and grafts: Secondary | ICD-10-CM | POA: Diagnosis not present

## 2022-09-27 DIAGNOSIS — Z1152 Encounter for screening for COVID-19: Secondary | ICD-10-CM | POA: Diagnosis not present

## 2022-09-27 DIAGNOSIS — G8250 Quadriplegia, unspecified: Secondary | ICD-10-CM | POA: Diagnosis not present

## 2022-09-27 DIAGNOSIS — R279 Unspecified lack of coordination: Secondary | ICD-10-CM | POA: Diagnosis not present

## 2022-09-27 DIAGNOSIS — Z0389 Encounter for observation for other suspected diseases and conditions ruled out: Secondary | ICD-10-CM | POA: Diagnosis not present

## 2022-09-27 DIAGNOSIS — Z515 Encounter for palliative care: Secondary | ICD-10-CM | POA: Diagnosis not present

## 2022-09-27 DIAGNOSIS — R0602 Shortness of breath: Secondary | ICD-10-CM | POA: Diagnosis not present

## 2022-09-27 DIAGNOSIS — R6521 Severe sepsis with septic shock: Secondary | ICD-10-CM | POA: Diagnosis not present

## 2022-09-27 DIAGNOSIS — A419 Sepsis, unspecified organism: Secondary | ICD-10-CM | POA: Diagnosis not present

## 2022-09-27 DIAGNOSIS — R29898 Other symptoms and signs involving the musculoskeletal system: Secondary | ICD-10-CM | POA: Diagnosis not present

## 2022-09-27 DIAGNOSIS — I10 Essential (primary) hypertension: Secondary | ICD-10-CM | POA: Diagnosis not present

## 2022-09-27 DIAGNOSIS — Z66 Do not resuscitate: Secondary | ICD-10-CM | POA: Diagnosis not present

## 2022-09-27 DIAGNOSIS — I480 Paroxysmal atrial fibrillation: Secondary | ICD-10-CM | POA: Diagnosis not present

## 2022-09-27 DIAGNOSIS — S12090A Other displaced fracture of first cervical vertebra, initial encounter for closed fracture: Secondary | ICD-10-CM | POA: Diagnosis not present

## 2022-09-27 DIAGNOSIS — R52 Pain, unspecified: Secondary | ICD-10-CM | POA: Diagnosis not present

## 2022-09-27 DIAGNOSIS — N179 Acute kidney failure, unspecified: Secondary | ICD-10-CM | POA: Diagnosis not present

## 2022-09-27 DIAGNOSIS — D62 Acute posthemorrhagic anemia: Secondary | ICD-10-CM | POA: Diagnosis not present

## 2022-09-27 DIAGNOSIS — M1990 Unspecified osteoarthritis, unspecified site: Secondary | ICD-10-CM | POA: Diagnosis not present

## 2022-09-27 DIAGNOSIS — R404 Transient alteration of awareness: Secondary | ICD-10-CM | POA: Diagnosis not present

## 2022-09-27 DIAGNOSIS — R000 Tachycardia, unspecified: Secondary | ICD-10-CM | POA: Diagnosis not present

## 2022-09-27 DIAGNOSIS — E039 Hypothyroidism, unspecified: Secondary | ICD-10-CM | POA: Diagnosis not present

## 2022-09-27 DIAGNOSIS — I428 Other cardiomyopathies: Secondary | ICD-10-CM | POA: Diagnosis not present

## 2022-09-27 DIAGNOSIS — S0003XA Contusion of scalp, initial encounter: Secondary | ICD-10-CM | POA: Diagnosis not present

## 2022-09-27 DIAGNOSIS — Z743 Need for continuous supervision: Secondary | ICD-10-CM | POA: Diagnosis not present

## 2022-09-27 DIAGNOSIS — M6281 Muscle weakness (generalized): Secondary | ICD-10-CM | POA: Diagnosis not present

## 2022-09-27 DIAGNOSIS — W19XXXA Unspecified fall, initial encounter: Secondary | ICD-10-CM | POA: Diagnosis present

## 2022-09-27 DIAGNOSIS — Y838 Other surgical procedures as the cause of abnormal reaction of the patient, or of later complication, without mention of misadventure at the time of the procedure: Secondary | ICD-10-CM | POA: Diagnosis present

## 2022-09-27 DIAGNOSIS — Z86711 Personal history of pulmonary embolism: Secondary | ICD-10-CM | POA: Diagnosis not present

## 2022-09-27 DIAGNOSIS — I272 Pulmonary hypertension, unspecified: Secondary | ICD-10-CM | POA: Diagnosis not present

## 2022-09-27 DIAGNOSIS — M4802 Spinal stenosis, cervical region: Secondary | ICD-10-CM | POA: Diagnosis not present

## 2022-09-27 NOTE — Patient Outreach (Signed)
Care Coordination   Follow Up Visit Note   09/26/2022 Name: Hannah [name] MRN: [id number] DOB: 07/12/1940  Hannah [name] is a 82 y.o. year old female who sees [name], MD for primary care. I spoke with  [name] by phone today.  What matters to the patients health and wellness today?  Level of Care    Goals Addressed             This Visit's Progress    Obtain Supportive Resources-Placement   On track    Activities and task to complete in order to accomplish goals.   Keep all upcoming appointments discussed today Continue with compliance of taking medication prescribed by Doctor Implement healthy coping skills discussed to assist with management of symptoms Work with [facility] department and/or SNF regarding application for LTC Medicaid          SDOH assessments and interventions completed:  No     Care Coordination Interventions:  Yes, provided  Interventions Today    Flowsheet Row Most Recent Value  Chronic Disease   Chronic disease during today's visit Diabetes, Hypertension (HTN), Atrial Fibrillation (AFib), Congestive Heart Failure (CHF), Chronic Kidney Disease/End Stage Renal Disease (ESRD)  General Interventions   General Interventions Discussed/Reviewed General Interventions Reviewed, Doctor Visits, Level of Care  [Patient is currently inpatient at [facility] after sustaining severe injuries from a fall. Patient has had a feeding tube placed and is expected to d/c to a SNF for continued services]  Doctor Visits Discussed/Reviewed Doctor Visits Reviewed  Level of Care Applications, Skilled Nursing Facility  Applications Medicaid  Education Interventions   Applications Medicaid  Mental Health Interventions   Mental Health Discussed/Reviewed Mental Health Reviewed, Coping Strategies  [LCSW provided safe and supportive environment for family to process feelings associated with pt's level of care from medical team. Feels delay in obtaining [facility] services  greatly impacted pt's current admission.]       Follow up plan: Follow up call scheduled for 2-4 weeks    Encounter Outcome:  Patient Visit Completed   [name], MSW, LCSW [facility] Care Management [facility]  [facility] [email or web address] Phone [phone] 6:18 PM

## 2022-09-27 NOTE — Patient Instructions (Signed)
Visit Information  Thank you for taking time to visit with me today. Please don't hesitate to contact me if I can be of assistance to you.   Following are the goals we discussed today:   Goals Addressed             This Visit's Progress    Obtain Supportive Resources-Placement   On track    Activities and task to complete in order to accomplish goals.   Keep all upcoming appointments discussed today Continue with compliance of taking medication prescribed by Doctor Implement healthy coping skills discussed to assist with management of symptoms Work with [facility] department and/or SNF regarding application for LTC Medicaid          Our next appointment is by telephone on 10/21 at 10 AM  Please call the care guide team at [phone] if you need to cancel or reschedule your appointment.   If you are experiencing a Mental Health or Behavioral Health Crisis or need someone to talk to, please call the Suicide and Crisis Lifeline: 988 call 911   Patient verbalizes understanding of instructions and care plan provided today and agrees to view in MyChart. Active MyChart status and patient understanding of how to access instructions and care plan via MyChart confirmed with patient.     [name], MSW, LCSW [facility] Care Management [place]  [facility] [email or web address] Phone [phone] 6:19 PM

## 2022-09-28 ENCOUNTER — Other Ambulatory Visit: Payer: Self-pay

## 2022-09-28 ENCOUNTER — Emergency Department
Admission: EM | Admit: 2022-09-28 | Discharge: 2022-09-28 | Disposition: A | Discharge status: Home / Self Care | Payer: Medicare Other | Source: Home / Self Care | Attending: Emergency Medicine | Admitting: Emergency Medicine

## 2022-09-28 DIAGNOSIS — Z5189 Encounter for other specified aftercare: Secondary | ICD-10-CM | POA: Insufficient documentation

## 2022-09-28 DIAGNOSIS — T82838A Hemorrhage of vascular prosthetic devices, implants and grafts, initial encounter: Secondary | ICD-10-CM | POA: Insufficient documentation

## 2022-09-28 DIAGNOSIS — Z4801 Encounter for change or removal of surgical wound dressing: Secondary | ICD-10-CM | POA: Diagnosis not present

## 2022-09-28 DIAGNOSIS — R5381 Other malaise: Secondary | ICD-10-CM | POA: Diagnosis not present

## 2022-09-28 NOTE — ED Triage Notes (Signed)
Pt presents to ER via ems from [facility] with c/o of bleeding fistula in right arm.  EMS states staff at facility did not know how long fistula has been bleeding for, when pt goes to dialysis, and that pt just arrived there yesterday.  Bleeding controlled with bandage at time of arrival.  Pt is on Warfarin per ems.  Pt otherwise alert to baseline per ems.    Pt does have difficulty with communication per ems.  Pt does answer questions on arrival, but there is noted delays in answering.

## 2022-09-28 NOTE — ED Notes (Signed)
The site which had been bleeding, was controlled at the time the dressing was removed.  It was redressed with a piece of surgicel and a gauze wrapping.

## 2022-09-28 NOTE — ED Provider Notes (Signed)
[facility] Provider Note    Event Date/Time   First MD Initiated Contact with Patient 09/28/22 [phone]     (approximate)   History   Wound Check   HPI  Hannah [name] is a 82 y.o. female   Past medical history of notable medical comorbidities but most pertinent is that she had a traumatic head bleed and central cord syndrome status post cervical laminectomy at [facility] and discharged just yesterday to a skilled nursing facility, noted to have bleeding in the right bicep area, minimal, new.  Patient is at baseline mental status, reports no pain or injuries.  She was noted to have a PICC line to the right brachial area that was removed prior to discharge at her [facility] hospitalization.  Discharge summary notes mental status oriented to person and to situation, but needs constant redirection for examinations  Independent Historian contributed to assessment above: EMS reports they did not notice any significant blood loss to the arm, scant bleeding applied bandage.  External Medical Documents Reviewed: [facility] discharge summary from earlier this week documenting baseline mental status, also heart rate at that time at discharge 99, noted to have right brachial PICC line temporary      Physical Exam   Triage Vital Signs: ED Triage Vitals  Encounter Vitals Group     BP      Systolic BP Percentile      Diastolic BP Percentile      Pulse      Resp      Temp      Temp src      SpO2      Weight      Height      Head Circumference      Peak Flow      Pain Score      Pain Loc      Pain Education      Exclude from Growth Chart     Most recent vital signs: Vitals:   09/28/22 0547 09/28/22 0552  BP: 104/62   Pulse: (!) 107   Resp: 18   Temp:  98.4 F (36.9 C)  SpO2: 97%     General: Awake, no distress.  CV:  Good peripheral perfusion.  Resp:  Normal effort.  Abd:  No distention.  Other:  Awake alert pleasant in no acute distress.  Punctate lesion on the  right brachial area consistent with prior PICC line, with no cellulitic changes, no active bleeding, no fluctuance, no pain, and good distal pulses.   ED Results / Procedures / Treatments   Labs (all labs ordered are listed, but only abnormal results are displayed) Labs Reviewed  BASIC METABOLIC PANEL  CBC WITH DIFFERENTIAL/PLATELET      PROCEDURES:  Critical Care performed: No  Procedures   MEDICATIONS ORDERED IN ED: Medications - No data to display  IMPRESSION / MDM / ASSESSMENT AND PLAN / ED COURSE  I reviewed the triage vital signs and the nursing notes.                                Patient's presentation is most consistent with acute presentation with potential threat to life or bodily function.  Differential diagnosis includes, but is not limited to, acute blood loss anemia, infection, abscess   The patient is on the cardiac monitor to evaluate for evidence of arrhythmia and/or significant heart rate changes.  MDM: Small amount of  bleeding from the right brachial PICC line site that is now hemostatic without any infectious changes, neurovascular intact, with baseline vital signs doubt significant blood loss, will apply hemostatic gauze and bandage and have her go back to her skilled nursing facility.       FINAL CLINICAL IMPRESSION(S) / ED DIAGNOSES   Final diagnoses:  Visit for wound check     Rx / DC Orders   ED Discharge Orders     None        Note:  This document was prepared using Dragon voice recognition software and may include unintentional dictation errors.    Pilar Jarvis, MD 09/28/22 [phone]

## 2022-09-28 NOTE — Discharge Instructions (Signed)
You had a small amount of bleeding from the PICC line in your right biceps area that has been removed during her hospitalization at [facility].  There is no active bleeding, keep it bandaged, follow-up with your regular doctor.  Please keep your wound clean by washing at least daily with soap and water. If you see any signs of infection like spreading redness, pus coming from the wound, extreme pain, fevers, chills or any other worsening doctor right away or come back to the emergency department  Thank you for choosing [place] for your health care today!  Please see your primary doctor this week for a follow up appointment.   If you have any new, worsening, or unexpected symptoms call your doctor right away or come back to the emergency department for reevaluation.  It was my pleasure to care for you today.   Daneil Dan [place] Charon, MD

## 2022-09-28 NOTE — ED Notes (Signed)
Pt calling out at this time requesting medicine. This RN informed pt that she would be going back to her nursing home once transport arrived.

## 2022-09-28 NOTE — ED Notes (Signed)
Provider to place orders

## 2022-09-29 DIAGNOSIS — L89310 Pressure ulcer of right buttock, unstageable: Secondary | ICD-10-CM | POA: Diagnosis not present

## 2022-09-29 DIAGNOSIS — E785 Hyperlipidemia, unspecified: Secondary | ICD-10-CM | POA: Diagnosis not present

## 2022-09-29 DIAGNOSIS — E0840 Diabetes mellitus due to underlying condition with diabetic neuropathy, unspecified: Secondary | ICD-10-CM | POA: Diagnosis not present

## 2022-09-29 DIAGNOSIS — E039 Hypothyroidism, unspecified: Secondary | ICD-10-CM | POA: Diagnosis not present

## 2022-09-30 ENCOUNTER — Emergency Department: Payer: Medicare Other

## 2022-09-30 ENCOUNTER — Inpatient Hospital Stay
Admission: EM | Admit: 2022-09-30 | Discharge: 2022-11-04 | DRG: 314 | Disposition: E | Payer: Medicare Other | Attending: Student | Admitting: Student

## 2022-09-30 ENCOUNTER — Other Ambulatory Visit: Payer: Self-pay

## 2022-09-30 DIAGNOSIS — R6521 Severe sepsis with septic shock: Secondary | ICD-10-CM | POA: Diagnosis present

## 2022-09-30 DIAGNOSIS — E1143 Type 2 diabetes mellitus with diabetic autonomic (poly)neuropathy: Secondary | ICD-10-CM | POA: Diagnosis present

## 2022-09-30 DIAGNOSIS — I82A11 Acute embolism and thrombosis of right axillary vein: Secondary | ICD-10-CM | POA: Diagnosis not present

## 2022-09-30 DIAGNOSIS — E875 Hyperkalemia: Secondary | ICD-10-CM | POA: Diagnosis present

## 2022-09-30 DIAGNOSIS — I5032 Chronic diastolic (congestive) heart failure: Secondary | ICD-10-CM | POA: Diagnosis not present

## 2022-09-30 DIAGNOSIS — S13101D Dislocation of unspecified cervical vertebrae, subsequent encounter: Secondary | ICD-10-CM | POA: Diagnosis not present

## 2022-09-30 DIAGNOSIS — Z86711 Personal history of pulmonary embolism: Secondary | ICD-10-CM

## 2022-09-30 DIAGNOSIS — I82611 Acute embolism and thrombosis of superficial veins of right upper extremity: Secondary | ICD-10-CM | POA: Diagnosis not present

## 2022-09-30 DIAGNOSIS — Z66 Do not resuscitate: Secondary | ICD-10-CM | POA: Diagnosis not present

## 2022-09-30 DIAGNOSIS — E11649 Type 2 diabetes mellitus with hypoglycemia without coma: Secondary | ICD-10-CM | POA: Diagnosis not present

## 2022-09-30 DIAGNOSIS — Z9049 Acquired absence of other specified parts of digestive tract: Secondary | ICD-10-CM

## 2022-09-30 DIAGNOSIS — S0003XA Contusion of scalp, initial encounter: Secondary | ICD-10-CM | POA: Diagnosis not present

## 2022-09-30 DIAGNOSIS — W19XXXA Unspecified fall, initial encounter: Secondary | ICD-10-CM | POA: Diagnosis present

## 2022-09-30 DIAGNOSIS — Y838 Other surgical procedures as the cause of abnormal reaction of the patient, or of later complication, without mention of misadventure at the time of the procedure: Secondary | ICD-10-CM | POA: Diagnosis present

## 2022-09-30 DIAGNOSIS — Z1152 Encounter for screening for COVID-19: Secondary | ICD-10-CM

## 2022-09-30 DIAGNOSIS — S13101A Dislocation of unspecified cervical vertebrae, initial encounter: Secondary | ICD-10-CM | POA: Diagnosis not present

## 2022-09-30 DIAGNOSIS — R0602 Shortness of breath: Secondary | ICD-10-CM | POA: Diagnosis not present

## 2022-09-30 DIAGNOSIS — L89312 Pressure ulcer of right buttock, stage 2: Secondary | ICD-10-CM | POA: Diagnosis not present

## 2022-09-30 DIAGNOSIS — E785 Hyperlipidemia, unspecified: Secondary | ICD-10-CM | POA: Diagnosis present

## 2022-09-30 DIAGNOSIS — S12112D Nondisplaced Type II dens fracture, subsequent encounter for fracture with routine healing: Secondary | ICD-10-CM

## 2022-09-30 DIAGNOSIS — Z515 Encounter for palliative care: Secondary | ICD-10-CM

## 2022-09-30 DIAGNOSIS — E538 Deficiency of other specified B group vitamins: Secondary | ICD-10-CM | POA: Diagnosis present

## 2022-09-30 DIAGNOSIS — J90 Pleural effusion, not elsewhere classified: Secondary | ICD-10-CM | POA: Diagnosis not present

## 2022-09-30 DIAGNOSIS — Z8674 Personal history of sudden cardiac arrest: Secondary | ICD-10-CM

## 2022-09-30 DIAGNOSIS — D649 Anemia, unspecified: Secondary | ICD-10-CM | POA: Diagnosis not present

## 2022-09-30 DIAGNOSIS — Z6831 Body mass index (BMI) 31.0-31.9, adult: Secondary | ICD-10-CM

## 2022-09-30 DIAGNOSIS — Z888 Allergy status to other drugs, medicaments and biological substances status: Secondary | ICD-10-CM

## 2022-09-30 DIAGNOSIS — I82B13 Acute embolism and thrombosis of subclavian vein, bilateral: Secondary | ICD-10-CM | POA: Diagnosis not present

## 2022-09-30 DIAGNOSIS — E039 Hypothyroidism, unspecified: Secondary | ICD-10-CM | POA: Diagnosis present

## 2022-09-30 DIAGNOSIS — Z0389 Encounter for observation for other suspected diseases and conditions ruled out: Secondary | ICD-10-CM | POA: Diagnosis not present

## 2022-09-30 DIAGNOSIS — I272 Pulmonary hypertension, unspecified: Secondary | ICD-10-CM | POA: Diagnosis present

## 2022-09-30 DIAGNOSIS — R54 Age-related physical debility: Secondary | ICD-10-CM | POA: Diagnosis present

## 2022-09-30 DIAGNOSIS — G9341 Metabolic encephalopathy: Secondary | ICD-10-CM | POA: Diagnosis present

## 2022-09-30 DIAGNOSIS — R579 Shock, unspecified: Secondary | ICD-10-CM | POA: Diagnosis present

## 2022-09-30 DIAGNOSIS — I82621 Acute embolism and thrombosis of deep veins of right upper extremity: Secondary | ICD-10-CM | POA: Diagnosis not present

## 2022-09-30 DIAGNOSIS — Z96643 Presence of artificial hip joint, bilateral: Secondary | ICD-10-CM | POA: Diagnosis present

## 2022-09-30 DIAGNOSIS — A419 Sepsis, unspecified organism: Principal | ICD-10-CM | POA: Diagnosis present

## 2022-09-30 DIAGNOSIS — T82838A Hemorrhage of vascular prosthetic devices, implants and grafts, initial encounter: Secondary | ICD-10-CM | POA: Diagnosis not present

## 2022-09-30 DIAGNOSIS — S12090A Other displaced fracture of first cervical vertebra, initial encounter for closed fracture: Secondary | ICD-10-CM | POA: Diagnosis present

## 2022-09-30 DIAGNOSIS — L8990 Pressure ulcer of unspecified site, unspecified stage: Secondary | ICD-10-CM | POA: Insufficient documentation

## 2022-09-30 DIAGNOSIS — E663 Overweight: Secondary | ICD-10-CM | POA: Diagnosis present

## 2022-09-30 DIAGNOSIS — R404 Transient alteration of awareness: Secondary | ICD-10-CM | POA: Diagnosis not present

## 2022-09-30 DIAGNOSIS — S12101A Unspecified nondisplaced fracture of second cervical vertebra, initial encounter for closed fracture: Secondary | ICD-10-CM | POA: Diagnosis not present

## 2022-09-30 DIAGNOSIS — Z79890 Hormone replacement therapy (postmenopausal): Secondary | ICD-10-CM

## 2022-09-30 DIAGNOSIS — D62 Acute posthemorrhagic anemia: Secondary | ICD-10-CM | POA: Diagnosis not present

## 2022-09-30 DIAGNOSIS — I428 Other cardiomyopathies: Secondary | ICD-10-CM | POA: Diagnosis present

## 2022-09-30 DIAGNOSIS — Z931 Gastrostomy status: Secondary | ICD-10-CM

## 2022-09-30 DIAGNOSIS — L89322 Pressure ulcer of left buttock, stage 2: Secondary | ICD-10-CM | POA: Diagnosis not present

## 2022-09-30 DIAGNOSIS — E559 Vitamin D deficiency, unspecified: Secondary | ICD-10-CM | POA: Diagnosis present

## 2022-09-30 DIAGNOSIS — Z96653 Presence of artificial knee joint, bilateral: Secondary | ICD-10-CM | POA: Diagnosis present

## 2022-09-30 DIAGNOSIS — N179 Acute kidney failure, unspecified: Secondary | ICD-10-CM | POA: Diagnosis present

## 2022-09-30 DIAGNOSIS — R000 Tachycardia, unspecified: Secondary | ICD-10-CM | POA: Diagnosis not present

## 2022-09-30 DIAGNOSIS — I2510 Atherosclerotic heart disease of native coronary artery without angina pectoris: Secondary | ICD-10-CM | POA: Diagnosis present

## 2022-09-30 DIAGNOSIS — M4801 Spinal stenosis, occipito-atlanto-axial region: Secondary | ICD-10-CM | POA: Diagnosis present

## 2022-09-30 DIAGNOSIS — G8250 Quadriplegia, unspecified: Secondary | ICD-10-CM | POA: Diagnosis present

## 2022-09-30 DIAGNOSIS — R0902 Hypoxemia: Secondary | ICD-10-CM | POA: Diagnosis not present

## 2022-09-30 DIAGNOSIS — S12111A Posterior displaced Type II dens fracture, initial encounter for closed fracture: Secondary | ICD-10-CM | POA: Diagnosis present

## 2022-09-30 DIAGNOSIS — I130 Hypertensive heart and chronic kidney disease with heart failure and stage 1 through stage 4 chronic kidney disease, or unspecified chronic kidney disease: Secondary | ICD-10-CM | POA: Diagnosis not present

## 2022-09-30 DIAGNOSIS — E669 Obesity, unspecified: Secondary | ICD-10-CM | POA: Diagnosis present

## 2022-09-30 DIAGNOSIS — K828 Other specified diseases of gallbladder: Secondary | ICD-10-CM | POA: Diagnosis not present

## 2022-09-30 DIAGNOSIS — R578 Other shock: Secondary | ICD-10-CM | POA: Diagnosis present

## 2022-09-30 DIAGNOSIS — M7981 Nontraumatic hematoma of soft tissue: Secondary | ICD-10-CM | POA: Diagnosis not present

## 2022-09-30 DIAGNOSIS — R7889 Finding of other specified substances, not normally found in blood: Secondary | ICD-10-CM | POA: Diagnosis not present

## 2022-09-30 DIAGNOSIS — I480 Paroxysmal atrial fibrillation: Secondary | ICD-10-CM | POA: Diagnosis present

## 2022-09-30 DIAGNOSIS — S1202XD Unstable burst fracture of first cervical vertebra, subsequent encounter for fracture with routine healing: Secondary | ICD-10-CM | POA: Diagnosis not present

## 2022-09-30 DIAGNOSIS — R1310 Dysphagia, unspecified: Secondary | ICD-10-CM | POA: Diagnosis present

## 2022-09-30 DIAGNOSIS — S12000A Unspecified displaced fracture of first cervical vertebra, initial encounter for closed fracture: Secondary | ICD-10-CM | POA: Diagnosis not present

## 2022-09-30 DIAGNOSIS — Z87891 Personal history of nicotine dependence: Secondary | ICD-10-CM

## 2022-09-30 DIAGNOSIS — R571 Hypovolemic shock: Secondary | ICD-10-CM | POA: Diagnosis present

## 2022-09-30 DIAGNOSIS — Z881 Allergy status to other antibiotic agents status: Secondary | ICD-10-CM

## 2022-09-30 DIAGNOSIS — E1122 Type 2 diabetes mellitus with diabetic chronic kidney disease: Secondary | ICD-10-CM | POA: Diagnosis present

## 2022-09-30 DIAGNOSIS — Z95828 Presence of other vascular implants and grafts: Secondary | ICD-10-CM | POA: Diagnosis not present

## 2022-09-30 DIAGNOSIS — E1142 Type 2 diabetes mellitus with diabetic polyneuropathy: Secondary | ICD-10-CM | POA: Diagnosis present

## 2022-09-30 DIAGNOSIS — Z79899 Other long term (current) drug therapy: Secondary | ICD-10-CM

## 2022-09-30 DIAGNOSIS — Z880 Allergy status to penicillin: Secondary | ICD-10-CM

## 2022-09-30 DIAGNOSIS — E871 Hypo-osmolality and hyponatremia: Secondary | ICD-10-CM | POA: Diagnosis present

## 2022-09-30 DIAGNOSIS — R0989 Other specified symptoms and signs involving the circulatory and respiratory systems: Secondary | ICD-10-CM | POA: Diagnosis not present

## 2022-09-30 DIAGNOSIS — Z743 Need for continuous supervision: Secondary | ICD-10-CM | POA: Diagnosis not present

## 2022-09-30 DIAGNOSIS — Z7901 Long term (current) use of anticoagulants: Secondary | ICD-10-CM

## 2022-09-30 DIAGNOSIS — N1831 Chronic kidney disease, stage 3a: Secondary | ICD-10-CM | POA: Diagnosis not present

## 2022-09-30 DIAGNOSIS — S7001XA Contusion of right hip, initial encounter: Secondary | ICD-10-CM | POA: Diagnosis present

## 2022-09-30 LAB — URINALYSIS, ROUTINE W REFLEX MICROSCOPIC
Bacteria, UA: NONE SEEN
Bilirubin Urine: NEGATIVE
Glucose, UA: >=500 mg/dL — AB
Ketones, ur: NEGATIVE mg/dL
Leukocytes,Ua: NEGATIVE
Nitrite: NEGATIVE
Protein, ur: NEGATIVE mg/dL
Specific Gravity, Urine: 1.014 (ref 1.005–1.030)
pH: 6 (ref 5.0–8.0)

## 2022-09-30 LAB — COMPREHENSIVE METABOLIC PANEL
ALT: 14 U/L (ref 0–44)
AST: 25 U/L (ref 15–41)
Albumin: 2.4 g/dL — ABNORMAL LOW (ref 3.5–5.0)
Alkaline Phosphatase: 67 U/L (ref 38–126)
Anion gap: 12 (ref 5–15)
BUN: 62 mg/dL — ABNORMAL HIGH (ref 8–23)
CO2: 25 mmol/L (ref 22–32)
Calcium: 8.7 mg/dL — ABNORMAL LOW (ref 8.9–10.3)
Chloride: 92 mmol/L — ABNORMAL LOW (ref 98–111)
Creatinine, Ser: 1.79 mg/dL — ABNORMAL HIGH (ref 0.44–1.00)
GFR, Estimated: 28 mL/min — ABNORMAL LOW (ref >60)
Glucose, Bld: 174 mg/dL — ABNORMAL HIGH (ref 70–99)
Potassium: 5.8 mmol/L — ABNORMAL HIGH (ref 3.5–5.1)
Sodium: 129 mmol/L — ABNORMAL LOW (ref 135–145)
Total Bilirubin: 0.6 mg/dL (ref 0.3–1.2)
Total Protein: 5.9 g/dL — ABNORMAL LOW (ref 6.5–8.1)

## 2022-09-30 LAB — PROCALCITONIN: Procalcitonin: 0.23 ng/mL

## 2022-09-30 LAB — CBC WITH DIFFERENTIAL/PLATELET
Abs Immature Granulocytes: 0.5 10*3/uL — ABNORMAL HIGH (ref 0.00–0.07)
Basophils Absolute: 0 10*3/uL (ref 0.0–0.1)
Basophils Relative: 0 %
Eosinophils Absolute: 0 10*3/uL (ref 0.0–0.5)
Eosinophils Relative: 0 %
HCT: 21.4 % — ABNORMAL LOW (ref 36.0–46.0)
Hemoglobin: 6.3 g/dL — ABNORMAL LOW (ref 12.0–15.0)
Immature Granulocytes: 2 %
Lymphocytes Relative: 10 %
Lymphs Abs: 2.7 10*3/uL (ref 0.7–4.0)
MCH: 29.2 pg (ref 26.0–34.0)
MCHC: 29.4 g/dL — ABNORMAL LOW (ref 30.0–36.0)
MCV: 99.1 fL (ref 80.0–100.0)
Monocytes Absolute: 2.1 10*3/uL — ABNORMAL HIGH (ref 0.1–1.0)
Monocytes Relative: 8 %
Neutro Abs: 22.1 10*3/uL — ABNORMAL HIGH (ref 1.7–7.7)
Neutrophils Relative %: 80 %
Platelets: 378 10*3/uL (ref 150–400)
RBC: 2.16 MIL/uL — ABNORMAL LOW (ref 3.87–5.11)
RDW: 17.8 % — ABNORMAL HIGH (ref 11.5–15.5)
Smear Review: NORMAL
WBC: 27.5 10*3/uL — ABNORMAL HIGH (ref 4.0–10.5)
nRBC: 0.6 % — ABNORMAL HIGH (ref 0.0–0.2)

## 2022-09-30 LAB — BLOOD GAS, ARTERIAL
Acid-Base Excess: 3.7 mmol/L — ABNORMAL HIGH (ref 0.0–2.0)
Bicarbonate: 28.5 mmol/L — ABNORMAL HIGH (ref 20.0–28.0)
O2 Saturation: 85.8 %
Patient temperature: 37
pCO2 arterial: 43 mmHg (ref 32–48)
pH, Arterial: 7.43 (ref 7.35–7.45)
pO2, Arterial: 53 mmHg — ABNORMAL LOW (ref 83–108)

## 2022-09-30 LAB — RESP PANEL BY RT-PCR (RSV, FLU A&B, COVID)  RVPGX2
Influenza A by PCR: NEGATIVE
Influenza B by PCR: NEGATIVE
Resp Syncytial Virus by PCR: NEGATIVE
SARS Coronavirus 2 by RT PCR: NEGATIVE

## 2022-09-30 LAB — BASIC METABOLIC PANEL
Anion gap: 9 (ref 5–15)
BUN: 53 mg/dL — ABNORMAL HIGH (ref 8–23)
CO2: 24 mmol/L (ref 22–32)
Calcium: 8.2 mg/dL — ABNORMAL LOW (ref 8.9–10.3)
Chloride: 99 mmol/L (ref 98–111)
Creatinine, Ser: 1.39 mg/dL — ABNORMAL HIGH (ref 0.44–1.00)
GFR, Estimated: 38 mL/min — ABNORMAL LOW (ref >60)
Glucose, Bld: 144 mg/dL — ABNORMAL HIGH (ref 70–99)
Potassium: 4.8 mmol/L (ref 3.5–5.1)
Sodium: 132 mmol/L — ABNORMAL LOW (ref 135–145)

## 2022-09-30 LAB — APTT: aPTT: 43 s — ABNORMAL HIGH (ref 24–36)

## 2022-09-30 LAB — HEMOGLOBIN AND HEMATOCRIT, BLOOD
HCT: 23.6 % — ABNORMAL LOW (ref 36.0–46.0)
Hemoglobin: 7.7 g/dL — ABNORMAL LOW (ref 12.0–15.0)

## 2022-09-30 LAB — PROTIME-INR
INR: 1.3 — ABNORMAL HIGH (ref 0.8–1.2)
Prothrombin Time: 16 s — ABNORMAL HIGH (ref 11.4–15.2)

## 2022-09-30 LAB — LACTIC ACID, PLASMA
Lactic Acid, Venous: 1.9 mmol/L (ref 0.5–1.9)
Lactic Acid, Venous: 1.5 mmol/L (ref 0.5–1.9)

## 2022-09-30 LAB — PREPARE RBC (CROSSMATCH)

## 2022-09-30 LAB — GLUCOSE, CAPILLARY
Glucose-Capillary: 116 mg/dL — ABNORMAL HIGH (ref 70–99)
Glucose-Capillary: 148 mg/dL — ABNORMAL HIGH (ref 70–99)

## 2022-09-30 LAB — MRSA NEXT GEN BY PCR, NASAL: MRSA by PCR Next Gen: NOT DETECTED

## 2022-09-30 LAB — TROPONIN I (HIGH SENSITIVITY): Troponin I (High Sensitivity): 10 ng/L (ref <18)

## 2022-09-30 MED ORDER — INSULIN ASPART 100 UNIT/ML IJ SOLN
10.0000 [IU] | Freq: Once | INTRAMUSCULAR | Status: AC
  Administered 2022-09-30: 10 [IU] via INTRAVENOUS
  Filled 2022-09-30: qty 1

## 2022-09-30 MED ORDER — NOREPINEPHRINE 4 MG/250ML-% IV SOLN: 2.0000 ug/min | INTRAVENOUS | Status: DC

## 2022-09-30 MED ORDER — PANTOPRAZOLE SODIUM 40 MG IV SOLR
40.0000 mg | Freq: Two times a day (BID) | INTRAVENOUS | Status: DC
  Administered 2022-09-30: 40 mg via INTRAVENOUS
  Filled 2022-09-30: qty 10

## 2022-09-30 MED ORDER — DEXTROSE 50 % IV SOLN
1.0000 | Freq: Once | INTRAVENOUS | Status: AC
  Administered 2022-09-30: 50 mL via INTRAVENOUS
  Filled 2022-09-30: qty 50

## 2022-09-30 MED ORDER — NOREPINEPHRINE 4 MG/250ML-% IV SOLN
0.0000 ug/min | INTRAVENOUS | Status: DC
  Administered 2022-09-30: 2 ug/min via INTRAVENOUS
  Filled 2022-09-30: qty 250

## 2022-09-30 MED ORDER — CHLORHEXIDINE GLUCONATE CLOTH 2 % EX PADS
6.0000 | MEDICATED_PAD | Freq: Every day | CUTANEOUS | Status: DC
  Administered 2022-09-30 – 2022-10-02 (×3): 6 via TOPICAL

## 2022-09-30 MED ORDER — SODIUM CHLORIDE 0.9 % IV BOLUS
1000.0000 mL | Freq: Once | INTRAVENOUS | Status: AC
  Administered 2022-09-30: 1000 mL via INTRAVENOUS

## 2022-09-30 MED ORDER — VANCOMYCIN HCL 2000 MG/400ML IV SOLN
2000.0000 mg | Freq: Once | INTRAVENOUS | Status: AC
  Administered 2022-09-30: 2000 mg via INTRAVENOUS
  Filled 2022-09-30: qty 400

## 2022-09-30 MED ORDER — VANCOMYCIN HCL 750 MG/150ML IV SOLN
750.0000 mg | Freq: Once | INTRAVENOUS | Status: DC
  Filled 2022-09-30: qty 150

## 2022-09-30 MED ORDER — SODIUM CHLORIDE 0.9 % IV SOLN: 250.0000 mL | INTRAVENOUS | Status: DC

## 2022-09-30 MED ORDER — SODIUM CHLORIDE 0.9 % IV SOLN
2.0000 g | INTRAVENOUS | Status: DC
  Administered 2022-09-30: 2 g via INTRAVENOUS
  Filled 2022-09-30: qty 12.5

## 2022-09-30 MED ORDER — SODIUM CHLORIDE 0.9 % IV BOLUS (SEPSIS): 1000.0000 mL | Freq: Once | INTRAVENOUS | Status: DC

## 2022-09-30 MED ORDER — INSULIN ASPART 100 UNIT/ML IJ SOLN
0.0000 [IU] | INTRAMUSCULAR | Status: DC
  Administered 2022-09-30 – 2022-10-01 (×2): 2 [IU] via SUBCUTANEOUS
  Filled 2022-09-30 (×2): qty 1

## 2022-09-30 MED ORDER — DOCUSATE SODIUM 100 MG PO CAPS: 100.0000 mg | ORAL_CAPSULE | Freq: Two times a day (BID) | ORAL | Status: DC | PRN

## 2022-09-30 MED ORDER — POLYETHYLENE GLYCOL 3350 17 G PO PACK: 17.0000 g | PACK | Freq: Every day | ORAL | Status: DC | PRN

## 2022-09-30 MED ORDER — SODIUM CHLORIDE 0.9 % IV BOLUS (SEPSIS)
1000.0000 mL | Freq: Once | INTRAVENOUS | Status: AC
  Administered 2022-09-30: 1000 mL via INTRAVENOUS

## 2022-09-30 MED ORDER — SODIUM CHLORIDE 0.9 % IV SOLN: Freq: Once | INTRAVENOUS | Status: DC

## 2022-09-30 MED ORDER — SODIUM CHLORIDE 0.9 % IV SOLN
2.0000 g | INTRAVENOUS | Status: DC
  Administered 2022-09-30: 2 g via INTRAVENOUS
  Filled 2022-09-30: qty 20

## 2022-09-30 MED ORDER — LEVOTHYROXINE SODIUM 50 MCG PO TABS: 25.0000 ug | ORAL_TABLET | Freq: Every day | ORAL | Status: DC

## 2022-09-30 MED ORDER — SODIUM CHLORIDE 0.9 % IV SOLN: 10.0000 mL/h | Freq: Once | INTRAVENOUS | Status: DC

## 2022-09-30 MED ORDER — FENTANYL CITRATE PF 50 MCG/ML IJ SOSY
50.0000 ug | PREFILLED_SYRINGE | INTRAMUSCULAR | Status: DC | PRN
  Administered 2022-09-30 – 2022-10-12 (×7): 50 ug via INTRAVENOUS
  Filled 2022-09-30 (×8): qty 1

## 2022-09-30 MED ORDER — LACTATED RINGERS IV SOLN: INTRAVENOUS | Status: AC

## 2022-09-30 MED ORDER — CALCIUM GLUCONATE-NACL 1-0.675 GM/50ML-% IV SOLN
1.0000 g | Freq: Once | INTRAVENOUS | Status: AC
  Administered 2022-09-30: 1000 mg via INTRAVENOUS
  Filled 2022-09-30: qty 50

## 2022-09-30 MED ORDER — SODIUM ZIRCONIUM CYCLOSILICATE 10 G PO PACK
10.0000 g | PACK | Freq: Once | ORAL | Status: DC
  Filled 2022-09-30: qty 1

## 2022-09-30 NOTE — ED Triage Notes (Signed)
Pt. To ED via EMS from [facility] for abnormal lab, Hgb of 6.8. unknown if any bleeding. Pt. Is baseline nonverbal, diabetic, CHF, a-fib. Medic states they think pt. Had routine blood draw.

## 2022-09-30 NOTE — ED Notes (Signed)
Lab called to draw additional blood. Pt. Is a difficult stick.

## 2022-09-30 NOTE — Progress Notes (Signed)
No appropriate veins found at this time for USGPIV for vasopressor line. Notified Doreen Salvage, Charity fundraiser. Tomasita Morrow, RN VAST

## 2022-09-30 NOTE — ED Notes (Signed)
Xray  [facility]  with   [facility]

## 2022-09-30 NOTE — Progress Notes (Signed)
PHARMACY CONSULT NOTE   Pharmacy Consult for Electrolyte Monitoring and Replacement   Recent Labs: Potassium (mmol/L)  Date Value  09/23/2022 5.8 (H)   Magnesium (mg/dL)  Date Value  76/16/0737 2.4   Calcium (mg/dL)  Date Value  10/62/6948 8.7 (L)   Albumin (g/dL)  Date Value  54/62/7035 2.4 (L)   Phosphorus (mg/dL)  Date Value  00/93/8182 3.0   Sodium (mmol/L)  Date Value  09/11/2022 129 (L)  06/29/2022 142    Assessment: Potassium level elevated, appropriate treatment already ordered (calcium gluconate, insulin, and dextrose) Magnesium level WNL No recent phosphorus level   Goal of Therapy:  Electrolytes WNL  Plan:  No additional supplementation is needed at this time Monitor BMP, Mag, and Phos with AM labs  Merryl Hacker ,PharmD Clinical Pharmacist 09/04/2022 6:13 PM

## 2022-09-30 NOTE — Consult Note (Signed)
PHARMACY -  BRIEF ANTIBIOTIC NOTE   Pharmacy has received consult(s) for cellulitis from an ED provider.  The patient's profile has been reviewed for ht/wt/allergies/indication/available labs.    One time order(s) placed for vancomycin  Further antibiotics/pharmacy consults should be ordered by admitting physician if indicated.                       Thank you, Ronnald Ramp 09/16/2022  1:13 PM

## 2022-09-30 NOTE — ED Notes (Signed)
Dr. Vicente Males  notified that pt's HR has been slowly increasing. MD aware.

## 2022-09-30 NOTE — ED Notes (Signed)
[facility]  transfer  center called

## 2022-09-30 NOTE — Sepsis Progress Note (Signed)
eLink is following this Code Sepsis. °

## 2022-09-30 NOTE — ED Notes (Signed)
Dr. Vicente Males notified pt's BP is 93/42, MAP 57. Dr. Vicente Males states to start pressor. Dr. Vicente Males notified that pt. Only has two 22g periph IV's in bilat hands, states to initiate pressors while awaiting IV team. NS and rocephin paused to accommodate pressor infusion.

## 2022-09-30 NOTE — ED Notes (Signed)
Dr. Vicente Males notifiedpt's BP is 82/39, MAP 52. MD states to start blood infusion, and if BP doesn't improve, start pressors.

## 2022-09-30 NOTE — ED Provider Notes (Signed)
[facility] Provider Note   Event Date/Time   First MD Initiated Contact with Patient 09/17/2022 [phone]     (approximate) History  Abnormal Lab (Pt. To ED via EMS from [facility] for abnormal lab, Hgb of 6.8. unknown if any bleeding. Pt. Is baseline nonverbal, diabetic, CHF. Medic states they think pt. Had routine blood draw.)  HPI Hannah [name] is a 82 y.o. female with an extensive past medical history of most notable for a traumatic brain bleed and central cord syndrome status post cervical laminectomy at [facility] and discharged 3 days ago to a skilled nursing facility who presents for anemia.  Patient was found to have a hemoglobin of 6.8 on routine blood work.  Patient is at baseline nonverbal, diabetic, with CHF, and A-fib.  Of note patient had a PICC line removed from the right upper arm bleeding. ROS: Unable to assess   Physical Exam  Triage Vital Signs: ED Triage Vitals  Encounter Vitals Group     BP 09/06/2022 0838 (!) 90/45     Systolic BP Percentile --      Diastolic BP Percentile --      Pulse Rate 09/16/2022 0838 97     Resp 09/16/2022 0838 15     Temp 09/10/2022 0838 98.6 F (37 C)     Temp Source 09/26/2022 0838 Oral     SpO2 10/02/2022 0838 92 %     Weight 09/19/2022 0839 200 lb 8 oz (90.9 kg)     Height --      Head Circumference --      Peak Flow --      Pain Score 09/17/2022 0839 0     Pain Loc --      Pain Education --      Exclude from Growth Chart --    Most recent vital signs: Vitals:   09/18/2022 1430 09/28/2022 1440  BP: (!) 104/49 (!) 94/57  Pulse: (!) 110 (!) 109  Resp: 14 (!) 7  Temp:    SpO2: 99% 98%   General: Awake, cooperative CV:  Good peripheral perfusion.  Resp:  Normal effort.  Abd:  No distention.  Other:  Elderly overweight African-American female resting comfortably in no acute distress ED Results / Procedures / Treatments  Labs (all labs ordered are listed, but only abnormal results are displayed) Labs Reviewed   COMPREHENSIVE METABOLIC PANEL - Abnormal; Notable for the following components:      Result Value   Sodium 129 (*)    Potassium 5.8 (*)    Chloride 92 (*)    Glucose, Bld 174 (*)    BUN 62 (*)    Creatinine, Ser 1.79 (*)    Calcium 8.7 (*)    Total Protein 5.9 (*)    Albumin 2.4 (*)    GFR, Estimated 28 (*)    All other components within normal limits  CBC WITH DIFFERENTIAL/PLATELET - Abnormal; Notable for the following components:   WBC 27.5 (*)    RBC 2.16 (*)    Hemoglobin 6.3 (*)    HCT 21.4 (*)    MCHC 29.4 (*)    RDW 17.8 (*)    nRBC 0.6 (*)    Neutro Abs 22.1 (*)    Monocytes Absolute 2.1 (*)    Abs Immature Granulocytes 0.50 (*)    All other components within normal limits  PROTIME-INR - Abnormal; Notable for the following components:   Prothrombin Time 16.0 (*)    INR 1.3 (*)  [facility] Provider Note   Event Date/Time   First MD Initiated Contact with Patient 09/17/2022 [phone]     (approximate) History  Abnormal Lab (Pt. To ED via EMS from [facility] for abnormal lab, Hgb of 6.8. unknown if any bleeding. Pt. Is baseline nonverbal, diabetic, CHF. Medic states they think pt. Had routine blood draw.)  HPI Hannah [name] is a 82 y.o. female with an extensive past medical history of most notable for a traumatic brain bleed and central cord syndrome status post cervical laminectomy at [facility] and discharged 3 days ago to a skilled nursing facility who presents for anemia.  Patient was found to have a hemoglobin of 6.8 on routine blood work.  Patient is at baseline nonverbal, diabetic, with CHF, and A-fib.  Of note patient had a PICC line removed from the right upper arm bleeding. ROS: Unable to assess   Physical Exam  Triage Vital Signs: ED Triage Vitals  Encounter Vitals Group     BP 09/06/2022 0838 (!) 90/45     Systolic BP Percentile --      Diastolic BP Percentile --      Pulse Rate 09/16/2022 0838 97     Resp 09/16/2022 0838 15     Temp 09/10/2022 0838 98.6 F (37 C)     Temp Source 09/26/2022 0838 Oral     SpO2 10/02/2022 0838 92 %     Weight 09/19/2022 0839 200 lb 8 oz (90.9 kg)     Height --      Head Circumference --      Peak Flow --      Pain Score 09/17/2022 0839 0     Pain Loc --      Pain Education --      Exclude from Growth Chart --    Most recent vital signs: Vitals:   09/18/2022 1430 09/28/2022 1440  BP: (!) 104/49 (!) 94/57  Pulse: (!) 110 (!) 109  Resp: 14 (!) 7  Temp:    SpO2: 99% 98%   General: Awake, cooperative CV:  Good peripheral perfusion.  Resp:  Normal effort.  Abd:  No distention.  Other:  Elderly overweight African-American female resting comfortably in no acute distress ED Results / Procedures / Treatments  Labs (all labs ordered are listed, but only abnormal results are displayed) Labs Reviewed   COMPREHENSIVE METABOLIC PANEL - Abnormal; Notable for the following components:      Result Value   Sodium 129 (*)    Potassium 5.8 (*)    Chloride 92 (*)    Glucose, Bld 174 (*)    BUN 62 (*)    Creatinine, Ser 1.79 (*)    Calcium 8.7 (*)    Total Protein 5.9 (*)    Albumin 2.4 (*)    GFR, Estimated 28 (*)    All other components within normal limits  CBC WITH DIFFERENTIAL/PLATELET - Abnormal; Notable for the following components:   WBC 27.5 (*)    RBC 2.16 (*)    Hemoglobin 6.3 (*)    HCT 21.4 (*)    MCHC 29.4 (*)    RDW 17.8 (*)    nRBC 0.6 (*)    Neutro Abs 22.1 (*)    Monocytes Absolute 2.1 (*)    Abs Immature Granulocytes 0.50 (*)    All other components within normal limits  PROTIME-INR - Abnormal; Notable for the following components:   Prothrombin Time 16.0 (*)    INR 1.3 (*)  re-evaluation of patient's condition.  MEDICATIONS ORDERED IN ED: Medications  0.9 %  sodium chloride infusion (has no administration in time range)  lactated ringers infusion (has no administration in time range)  sodium chloride 0.9 % bolus 1,000 mL (1,000 mLs Intravenous New Bag/Given 09/15/2022 1334)    And  sodium chloride 0.9 % bolus 1,000 mL (has no administration in time range)    And  sodium chloride 0.9 % bolus 1,000 mL (has no administration in time range)  cefTRIAXone (ROCEPHIN) 2 g in sodium chloride 0.9 % 100 mL IVPB (0 g Intravenous Stopped 09/19/2022 1306)  norepinephrine (LEVOPHED) 4mg  in (0.016 mg/mL) premix infusion (5 mcg/min  Intravenous Rate/Dose Change 09/29/2022 1118)  vancomycin (VANCOREADY) IVPB 2000 mg/400 mL (2,000 mg Intravenous New Bag/Given 09/14/2022 1550)  sodium chloride 0.9 % bolus 1,000 mL (0 mLs Intravenous Stopped 09/08/2022 1336)  calcium gluconate 1 g/ 50 mL sodium chloride IVPB (0 mg Intravenous Stopped 09/17/2022 1410)   IMPRESSION / MDM / ASSESSMENT AND PLAN / ED COURSE  I reviewed the triage vital signs and the nursing notes.                             The patient is on the cardiac monitor to evaluate for evidence of arrhythmia and/or significant heart rate changes. Patient's presentation is most consistent with acute presentation with potential threat to life or bodily function. Patient is a 82 year old female with a significant recent past medical history of C1/C2 fracture status post laminectomy who presents via long-term care facility with indication via EMS that she was sent only for a low hemoglobin level that was found on daily blood work at her long-term care facility.  Patient does show signs of worsening neurologic exam at this time with decreased movement in upper extremities as well as lower extremities.  Differential diagnosis includes but is not limited to GI bleeding, hematuria, intracranial hemorrhage, chronic blood loss anemia, anemia of chronic disease. Significant laboratory results include leukocytosis to 27, hemoglobin of 6.3, sodium 129, potassium 5.8, creatinine 1.79  CT of the cervical spine does show worsening dislocation of C1 on C2 with significant spinal canal stenosis  I spoke with on-call neurosurgery team who recommended following up with [facility] neurosurgery team as they evaluated this patient and performed her procedure.  I spoke with Dr. Cherly Hensen in the neuro ICU team at [facility] who relayed that Dr. Despina Pole and neurosurgery did not have any surgical intervention to offer for this patient and therefore would not need transfer.  Given the patient will not need to be transferred at this  time, patient will require admission for further evaluation and management.  Dispo: Admit    FINAL CLINICAL IMPRESSION(S) / ED DIAGNOSES   Final diagnoses:  Closed dislocation of cervical vertebra, initial encounter  Closed unstable burst fracture of first cervical vertebra with routine healing, subsequent encounter  Closed nondisplaced odontoid fracture with type II morphology and routine healing, subsequent encounter  Sepsis, due to unspecified organism, unspecified whether acute organ dysfunction present [facility])   Rx / DC Orders   ED Discharge Orders     None      Note:  This document was prepared using Dragon voice recognition software and may include unintentional dictation errors.   Merwyn Katos, MD 10/01/22 [phone]  [facility] Provider Note   Event Date/Time   First MD Initiated Contact with Patient 09/17/2022 [phone]     (approximate) History  Abnormal Lab (Pt. To ED via EMS from [facility] for abnormal lab, Hgb of 6.8. unknown if any bleeding. Pt. Is baseline nonverbal, diabetic, CHF. Medic states they think pt. Had routine blood draw.)  HPI Hannah [name] is a 82 y.o. female with an extensive past medical history of most notable for a traumatic brain bleed and central cord syndrome status post cervical laminectomy at [facility] and discharged 3 days ago to a skilled nursing facility who presents for anemia.  Patient was found to have a hemoglobin of 6.8 on routine blood work.  Patient is at baseline nonverbal, diabetic, with CHF, and A-fib.  Of note patient had a PICC line removed from the right upper arm bleeding. ROS: Unable to assess   Physical Exam  Triage Vital Signs: ED Triage Vitals  Encounter Vitals Group     BP 09/06/2022 0838 (!) 90/45     Systolic BP Percentile --      Diastolic BP Percentile --      Pulse Rate 09/16/2022 0838 97     Resp 09/16/2022 0838 15     Temp 09/10/2022 0838 98.6 F (37 C)     Temp Source 09/26/2022 0838 Oral     SpO2 10/02/2022 0838 92 %     Weight 09/19/2022 0839 200 lb 8 oz (90.9 kg)     Height --      Head Circumference --      Peak Flow --      Pain Score 09/17/2022 0839 0     Pain Loc --      Pain Education --      Exclude from Growth Chart --    Most recent vital signs: Vitals:   09/18/2022 1430 09/28/2022 1440  BP: (!) 104/49 (!) 94/57  Pulse: (!) 110 (!) 109  Resp: 14 (!) 7  Temp:    SpO2: 99% 98%   General: Awake, cooperative CV:  Good peripheral perfusion.  Resp:  Normal effort.  Abd:  No distention.  Other:  Elderly overweight African-American female resting comfortably in no acute distress ED Results / Procedures / Treatments  Labs (all labs ordered are listed, but only abnormal results are displayed) Labs Reviewed   COMPREHENSIVE METABOLIC PANEL - Abnormal; Notable for the following components:      Result Value   Sodium 129 (*)    Potassium 5.8 (*)    Chloride 92 (*)    Glucose, Bld 174 (*)    BUN 62 (*)    Creatinine, Ser 1.79 (*)    Calcium 8.7 (*)    Total Protein 5.9 (*)    Albumin 2.4 (*)    GFR, Estimated 28 (*)    All other components within normal limits  CBC WITH DIFFERENTIAL/PLATELET - Abnormal; Notable for the following components:   WBC 27.5 (*)    RBC 2.16 (*)    Hemoglobin 6.3 (*)    HCT 21.4 (*)    MCHC 29.4 (*)    RDW 17.8 (*)    nRBC 0.6 (*)    Neutro Abs 22.1 (*)    Monocytes Absolute 2.1 (*)    Abs Immature Granulocytes 0.50 (*)    All other components within normal limits  PROTIME-INR - Abnormal; Notable for the following components:   Prothrombin Time 16.0 (*)    INR 1.3 (*)  [facility] Provider Note   Event Date/Time   First MD Initiated Contact with Patient 09/17/2022 [phone]     (approximate) History  Abnormal Lab (Pt. To ED via EMS from [facility] for abnormal lab, Hgb of 6.8. unknown if any bleeding. Pt. Is baseline nonverbal, diabetic, CHF. Medic states they think pt. Had routine blood draw.)  HPI Hannah [name] is a 82 y.o. female with an extensive past medical history of most notable for a traumatic brain bleed and central cord syndrome status post cervical laminectomy at [facility] and discharged 3 days ago to a skilled nursing facility who presents for anemia.  Patient was found to have a hemoglobin of 6.8 on routine blood work.  Patient is at baseline nonverbal, diabetic, with CHF, and A-fib.  Of note patient had a PICC line removed from the right upper arm bleeding. ROS: Unable to assess   Physical Exam  Triage Vital Signs: ED Triage Vitals  Encounter Vitals Group     BP 09/06/2022 0838 (!) 90/45     Systolic BP Percentile --      Diastolic BP Percentile --      Pulse Rate 09/16/2022 0838 97     Resp 09/16/2022 0838 15     Temp 09/10/2022 0838 98.6 F (37 C)     Temp Source 09/26/2022 0838 Oral     SpO2 10/02/2022 0838 92 %     Weight 09/19/2022 0839 200 lb 8 oz (90.9 kg)     Height --      Head Circumference --      Peak Flow --      Pain Score 09/17/2022 0839 0     Pain Loc --      Pain Education --      Exclude from Growth Chart --    Most recent vital signs: Vitals:   09/18/2022 1430 09/28/2022 1440  BP: (!) 104/49 (!) 94/57  Pulse: (!) 110 (!) 109  Resp: 14 (!) 7  Temp:    SpO2: 99% 98%   General: Awake, cooperative CV:  Good peripheral perfusion.  Resp:  Normal effort.  Abd:  No distention.  Other:  Elderly overweight African-American female resting comfortably in no acute distress ED Results / Procedures / Treatments  Labs (all labs ordered are listed, but only abnormal results are displayed) Labs Reviewed   COMPREHENSIVE METABOLIC PANEL - Abnormal; Notable for the following components:      Result Value   Sodium 129 (*)    Potassium 5.8 (*)    Chloride 92 (*)    Glucose, Bld 174 (*)    BUN 62 (*)    Creatinine, Ser 1.79 (*)    Calcium 8.7 (*)    Total Protein 5.9 (*)    Albumin 2.4 (*)    GFR, Estimated 28 (*)    All other components within normal limits  CBC WITH DIFFERENTIAL/PLATELET - Abnormal; Notable for the following components:   WBC 27.5 (*)    RBC 2.16 (*)    Hemoglobin 6.3 (*)    HCT 21.4 (*)    MCHC 29.4 (*)    RDW 17.8 (*)    nRBC 0.6 (*)    Neutro Abs 22.1 (*)    Monocytes Absolute 2.1 (*)    Abs Immature Granulocytes 0.50 (*)    All other components within normal limits  PROTIME-INR - Abnormal; Notable for the following components:   Prothrombin Time 16.0 (*)    INR 1.3 (*)

## 2022-09-30 NOTE — Consult Note (Signed)
CODE SEPSIS - PHARMACY COMMUNICATION  **Broad Spectrum Antibiotics should be administered within 1 hour of Sepsis diagnosis**  Time Code Sepsis Called/Page Received: 6962  Antibiotics Ordered: ceftriaxone  Time of 1st antibiotic administration: 1028  Additional action taken by pharmacy: messaged RN  If necessary, Name of Provider/Nurse Contacted: Elana,     Ronnald Ramp ,PharmD Clinical Pharmacist  09/24/2022  11:02 AM

## 2022-09-30 NOTE — Progress Notes (Signed)
Received from ER, no C collar in place. Per RN"s they were told to just not turn patient.

## 2022-09-30 NOTE — Progress Notes (Signed)
Pharmacy Antibiotic Note  Hannah [name] is a 82 y.o. female admitted on 09/13/2022 with sepsis.  Pharmacy has been consulted for cefepime and vancomycin dosing.  Patient already received vancomycin 2 g IV x 1 dose and ceftriaxone 2 g IV x 1 dose  Plan: Day 1 of antibiotics Give vancomycin 750 mg IV x 1 dose 24h after loading dose. Check random vanc level on Sunday given AKI (SCr 1.79, baseline 1.1-1.3) Start cefepime 2 g IV Q24H Continue to monitor renal function and follow culture results   Weight: 90.9 kg (200 lb 8 oz)  Temp (24hrs), Avg:99 F (37.2 C), Min:98.2 F (36.8 C), Max:99.5 F (37.5 C)  Recent Labs  Lab 09/08/2022 0847 09/11/2022 1022  WBC 27.5*  --   CREATININE 1.79*  --   LATICACIDVEN  --  1.9    Estimated Creatinine Clearance: 28.5 mL/min (A) (by C-G formula based on SCr of 1.79 mg/dL (H)).    Allergies  Allergen Reactions   Augmentin [Amoxicillin-Pot Clavulanate] Itching and Other (See Comments)    Severe vaginal itching   Tranxene [Clorazepate] Itching   Crestor [Rosuvastatin]     Made her sick on stomach, she is able to tolerate the zocor   Penicillins Itching and Rash    Antimicrobials this admission: 9/27 Vanc >>  9/27 Cefepime >>  9/27 Ceftriaxone x 1 dose  Dose adjustments this admission: N/A  Microbiology results: 9/27 BCx: IP   Thank you for allowing pharmacy to be a part of this patient's care.  Merryl Hacker, PharmD Clinical Pharmacist 09/14/2022 5:52 PM

## 2022-09-30 NOTE — H&P (Signed)
NAME:  Hannah [name], MRN:  [id number], DOB:  03/27/1940, LOS: 0 ADMISSION DATE:  2022-10-12, CONSULTATION DATE:  Oct 12, 2022 REFERRING MD:  Dr. Marisa Severin, CHIEF COMPLAINT:  Acute Anemia   Brief Pt Description / Synopsis:  82 y.o. female, DNR/DNI, with recent C1/C2 fracture and central cord syndrome approximately 1 month ago following a fall at home status post laminectomy at [facility], now admitted with Multifactorial Shock (Hemorrhagic +septic), Severe Sepsis of unknown etiology, Acute Blood Loss Anemia, and worsening dislocation of the lateral massess of C1 and C2 with significant spinal stenosis.  History of Present Illness:  Hannah [name] is a 82 year old female with a past medical history significant for recent C1/C2 fracture and central cord syndrome approximately 1 month ago following a fall at home status post laminectomy at [facility], PE, HFpEF, CKD Stage IIIa, DM Type 2, HTN, paroxsymal A.fib, and Hypothyroidism who presents to [facility] ED on 10/12/2022 due to reported hemoglobin of 6.8 at [facility] healthcare facility.  Patient is currently altered and unable to contribute to history, therefore history obtained from chart review along with report from patient's daughter via telephone.  Per chart review she was discharged to [facility] healthcare 3 days ago following a month-long stay at [facility] for laminectomy.  She was sent to the ED on 9/25 due to bleeding in the right bicep area where PICC line was previously located (was removed prior to discharge at [facility]).  Hemostatic gauze and bandage were placed and she was discharged back to [facility] healthcare.  Of note, details of recent fracture are as follows: She had a fall on 8/21 with associated neck pain and bilateral weakness. She was admitted at [facility] where CT showed C1-C1 fracture and a small subarachnoid hemorrhage. MRI obtained showed C1 and C2 fracture, ossification of posterior longitudinal ligament, cervical spondylosis, cervical spinal cord injury,  central cord syndrome.  Initial plan by neuro surgery at [facility] was for posterior cervical decompression, instrumentation and fusion from C1-C6 to be done on 8/28, but it was felt she would be best served by medically stabilizing and correcting coagulopathy prior surgery.  However on 8/25 family requested she be transferred to [facility] for Neurosurgical intervention there.   ED Course: Initial Vital Signs: Temperature 98.6 F orally, pulse 97, respiratory 15, blood pressure 90/45, SpO2 92% Significant Labs: Sodium 129, potassium 5.8, glucose 174, BUN 62, creatinine 1.79, at bedtime troponin 10, lactic acid 1.9, WBC 27.5 with mild left shift, hemoglobin 6.3, INR 1.3, PT 16 ABG: pH 7.43/pCO2 43/pO2 53/bicarb 20.5 COVID-19 PCR is negative Imaging Chest X-ray>>1. Low lung volumes, otherwise no acute cardiopulmonary abnormality. Aortic Atherosclerosis (ICD10-I70.0). 2. Possible oral contrast mixed with stool in visible bowel. CT Head w/o contrast>>IMPRESSION: No evidence of acute intracranial abnormality.  Moderate chronic small vessel ischemic disease and advanced cerebral atrophy. New bilateral mastoid and middle ear effusions. CT Cervical Spine>>IMPRESSION: 1. Known recent C1 and C2 fractures with increased fracture displacement and angulation including new complete posterior dislocation of the lateral masses of C1 relative to C2. Resultant severe spinal stenosis at C1-2. Venous US RUE>>IMPRESSION: No evidence of DVT within the right upper extremity. Medications Administered: 3L NS boluses, Ceftriaxone and Vancomycin, 1g Calcium gluconate, 1 unit pRBCs  She met Sepsis criteria therefore cultures were drawn and she recevied broad spectrum ABX.  Given concern for dislocation of the lateral massess of C1 and C2 with significant spinal stenosis, ED provider Dr. Vicente Males spoke with Dr. Cherly Hensen of Neuro ICU at [facility] who discussed with Dr. Despina Pole Neurosurgeon  at [facility], NO surgical intervention to be  offered per [facility] thus pt not needing transfer.  Despite IV fluid resuscitation and 1 unit of blood she remained hypotensive requiring initiation of peripheral Levophed.  PCCM is asked to admit for further workup and treatment.    Please see "Significant Hospital Events" section below for full detailed hospital course.  Pertinent  Medical History   Past Medical History:  Diagnosis Date   Acute massive pulmonary embolism (HCC) 07/13/2012   Massive PE w/ PEA arrest 07/13/12 >TNK >IVC filter >discharged on comadin     Anticoagulated on warfarin    Chronic diastolic CHF (congestive heart failure) (HCC) 03/15/2017   Chronic kidney disease, stage 3a (HCC) 12/15/2018   Diabetes mellitus, type 2 (HCC) 07/15/2012   with peripheral neuropathy   DM2 (diabetes mellitus, type 2) (HCC) 07/23/2012   HFrEF (heart failure with reduced ejection fraction) (HCC) 03/15/2017   Non-ischemic cardiomyopathy // Cardiac catheterization 01/01/21:  RCA prox to mid 20 // Echocardiogram 12/26/20:  EF 30-35, ant septum/apex, mid inf-sept AK; ant-lat/ant/inf HK, Gr 1 DD, mild LVH, normal RVSF, RVSP 27.2, small effusion (ant to RV), mild MR, mod TR, AV sclerosis w/o AS   Hyperlipemia 07/14/2012   Hyperlipidemia 02/25/2013   Hypertension 07/14/2012   Hypothyroid 06/06/2013   Large bowel stricture (HCC)    s/p colectomy in 2016   Neuropathy 02/12/2015   Osteoarthritis    s/p hip and knee replacements   PAF (paroxysmal atrial fibrillation) (HCC)    Pulmonary hypertension (HCC) 07/26/2016   Syncope 10/2018   Thyroid disease     Micro Data:  2022-10-27: SARS-CoV-2/flu/RSV PCR>> negative October 27, 2022: Blood culture x 2>>  Antimicrobials:   Anti-infectives (From admission, onward)    Start     Dose/Rate Route Frequency Ordered Stop   10/01/22 1500  vancomycin (VANCOREADY) IVPB 750 mg/150 mL        750 mg 150 mL/hr over 60 Minutes Intravenous  Once 10-27-22 1809     October 27, 2022 1830  ceFEPIme (MAXIPIME) 2 g in sodium chloride 0.9 % 100 mL  IVPB        2 g 200 mL/hr over 30 Minutes Intravenous Every 24 hours 2022-10-27 1805     10/27/2022 1315  vancomycin (VANCOREADY) IVPB 2000 mg/400 mL        2,000 mg 200 mL/hr over 120 Minutes Intravenous  Once 27-Oct-2022 1312 27-Oct-2022 1750   Oct 27, 2022 0945  cefTRIAXone (ROCEPHIN) 2 g in sodium chloride 0.9 % 100 mL IVPB  Status:  Discontinued        2 g 200 mL/hr over 30 Minutes Intravenous Every 24 hours Oct 27, 2022 0935 Oct 27, 2022 1744        Significant Hospital Events: Including procedures, antibiotic start and stop dates in addition to other pertinent events   10-27-22: Presented to ED for reports of Hgb 6.8 at facility.  Found to be hypotensive.  CT Cervical Spine with dislocation of the lateral massess of C1 and C2 with significant spinal strenosis.  [facility] states NO surgical intervention to be offered, thus pt not needing transfer.  Remained hypotensive requiring initiation of peripheral Levophed.  PCCM asked to admit.  Interim History / Subjective:  -Patient seen in the ED -Awake and alert, oriented only to self and location, in no acute distress -Has received 1 unit of PRBCs, on 5 mics of Levophed currently with systolic blood pressure 110-120  Objective   Blood pressure (!) 115/46, pulse (!) 108, temperature 99.4 F (37.4 C), resp. rate Marland Kitchen)  8, weight 90.9 kg, SpO2 97%.        Intake/Output Summary (Last 24 hours) at 10/04/22 1758 Last data filed at 10-04-22 1550 Gross per 24 hour  Intake 2144.07 ml  Output --  Net 2144.07 ml   Filed Weights   10/04/22 0839  Weight: 90.9 kg    Examination: General: Acute on chronically ill-appearing female, laying in bed, on 2 L nasal cannula, no acute distress HENT: Atraumatic, normocephalic Lungs: Clear breath sounds throughout, even, nonlabored Cardiovascular: Tachycardia, regular rhythm, S1-S2, no murmurs, rubs, gallops Abdomen: Obese, soft, nontender, nondistended, no guarding rebound tenderness, bowel sounds positive x  4 Extremities: Right upper extremity swollen, generalized weakness throughout Neuro: Awake and alert, oriented only to self and place, pupils PERRLA GU: Foley catheter in place draining yellow urine  Resolved Hospital Problem list     Assessment & Plan:   #Shock: Hypovolemic/Hemorrhagic + Septic +/- Autonomic dysfunction in setting of spine fracture PMHx: HFpEF, Paroxsymal A.fib, HTN, HLD, PE -Continuous cardiac monitoring -Maintain MAP >65 -IV fluids -Transfusions as indicated -Vasopressors as needed to maintain MAP goal -Trend lactic acid until normalized -Trend HS Troponin until peaked  #Meets SIRS Criteria upon Presentation (HR >90, WBC 27) #Severe Sepsis due to unknown source currently -Monitor fever curve -Trend WBC's & Procalcitonin -Follow cultures as above -Continue empiric Cefepime and Vancomycin pending cultures & sensitivities  #Acute Blood Loss Anemia, reported she was bleeding from previous PICC site -Monitor for S/Sx of bleeding -Trend CBC -SCD's for VTE Prophylaxis (avoid chemical ppx for now) -Transfuse for Hgb <8 given shock -Will empirically place on Protonix twice daily for now  #Acute Kidney Injury on CKD Stage IIIa #Hyperkalemia #Hyponatremia -Monitor I&O's / urinary output -Follow BMP -Ensure adequate renal perfusion -Avoid nephrotoxic agents as able -Replace electrolytes as indicated ~ Pharmacy following for assistance with electrolyte replacement -Temporizing measures given (Ca gluconate, insulin) along with fluids ~ repeat BMP later tonight  #Recent C1/C2 fracture with central cord syndrome s/p Laminectomy at [facility], now with worsening dislocation and significant spinal canal stenosis PMHx: small traumatic SAH -ED provider Dr. Vicente Males spoke with Dr. Cherly Hensen of Neuro ICU at [facility] who discussed with Dr. Despina Pole Neurosurgeon at [facility] ~ NO surgical intervention to be offered per [facility] -Provide supportive care -Consult Neurosurgery here at [facility] for further  recommendations  #Diabetes Mellitus Type II #Hypothyroidism -CBG's q4h; Target range of 140 to 180 -SSI -Follow ICU Hypo/Hyperglycemia protocol -Resume home synthroid -Check thyroid panel     Patient is critically ill superimposed on multiple chronic comorbidities and advanced age.  Prognosis is extremely guarded, high risk for further decompensation, cardiac arrest and death.  Patient is DNR/DNI status as confirmed by patient's daughter.    Best Practice (right click and "Reselect all SmartList Selections" daily)   Diet/type: NPO, will need speech evaluation DVT prophylaxis: SCD GI prophylaxis: PPI Lines: N/A Foley:  Yes, and it is still needed Code Status:  DNR/DNI Last date of multidisciplinary goals of care discussion [n/a]  10/04/2022: Updated pt's daughter Marrian Salvage via telephone.  She confirms DNR/DNI status.  Labs   CBC: Recent Labs  Lab 10-04-2022 0847  WBC 27.5*  NEUTROABS 22.1*  HGB 6.3*  HCT 21.4*  MCV 99.1  PLT 378    Basic Metabolic Panel: Recent Labs  Lab 10/04/2022 0847  NA 129*  K 5.8*  CL 92*  CO2 25  GLUCOSE 174*  BUN 62*  CREATININE 1.79*  CALCIUM 8.7*   GFR: Estimated Creatinine Clearance: 28.5 mL/min (  A) (by C-G formula based on SCr of 1.79 mg/dL (H)). Recent Labs  Lab 09/08/2022 0847 09/20/2022 1022  WBC 27.5*  --   LATICACIDVEN  --  1.9    Liver Function Tests: Recent Labs  Lab 09/07/2022 0847  AST 25  ALT 14  ALKPHOS 67  BILITOT 0.6  PROT 5.9*  ALBUMIN 2.4*   No results for input(s): "LIPASE", "AMYLASE" in the last 168 hours. No results for input(s): "AMMONIA" in the last 168 hours.  ABG    Component Value Date/Time   PHART 7.43 09/20/2022 1600   PCO2ART 43 09/16/2022 1600   PO2ART 53 (L) 09/17/2022 1600   HCO3 28.5 (H) 09/07/2022 1600   TCO2 18 (L) 08/24/2022 2030   ACIDBASEDEF 6.0 (H) 12/30/2020 1655   O2SAT 85.8 09/09/2022 1600     Coagulation Profile: Recent Labs  Lab 09/12/2022 1022  INR 1.3*     Cardiac Enzymes: No results for input(s): "CKTOTAL", "CKMB", "CKMBINDEX", "TROPONINI" in the last 168 hours.  HbA1C: Hgb A1c MFr Bld  Date/Time Value Ref Range Status  08/25/2022 12:12 AM 6.0 (H) 4.8 - 5.6 % Final    Comment:    (NOTE) Pre diabetes:          5.7%-6.4%  Diabetes:              >6.4%  Glycemic control for   <7.0% adults with diabetes   04/25/2022 05:49 AM 7.2 (H) 4.8 - 5.6 % Final    Comment:    (NOTE) Pre diabetes:          5.7%-6.4%  Diabetes:              >6.4%  Glycemic control for   <7.0% adults with diabetes     CBG: No results for input(s): "GLUCAP" in the last 168 hours.  Review of Systems:   Unable to assess due to AMS   Past Medical History:  She,  has a past medical history of Acute massive pulmonary embolism (HCC) (07/13/2012), Anticoagulated on warfarin, Chronic diastolic CHF (congestive heart failure) (HCC) (03/15/2017), Chronic kidney disease, stage 3a (HCC) (12/15/2018), Diabetes mellitus, type 2 (HCC) (07/15/2012), DM2 (diabetes mellitus, type 2) (HCC) (07/23/2012), HFrEF (heart failure with reduced ejection fraction) (HCC) (03/15/2017), Hyperlipemia (07/14/2012), Hyperlipidemia (02/25/2013), Hypertension (07/14/2012), Hypothyroid (06/06/2013), Large bowel stricture (HCC), Neuropathy (02/12/2015), Osteoarthritis, PAF (paroxysmal atrial fibrillation) (HCC), Pulmonary hypertension (HCC) (07/26/2016), Syncope (10/2018), and Thyroid disease.   Surgical History:   Past Surgical History:  Procedure Laterality Date   COLONOSCOPY N/A 03/12/2016   Procedure: COLONOSCOPY;  Surgeon: Dorena Cookey, MD;  Location: [facility] ENDOSCOPY;  Service: Endoscopy;  Laterality: N/A;   COLOSTOMY N/A 06/03/2014   Procedure: COLOSTOMY;  Surgeon: Harriette Bouillon, MD;  Location: [facility] OR;  Service: General;  Laterality: N/A;   FLEXIBLE SIGMOIDOSCOPY N/A 05/30/2014   Procedure: Arnell Sieving;  Surgeon: Jeani Hawking, MD;  Location: [facility] ENDOSCOPY;  Service: Endoscopy;  Laterality: N/A;    INSERTION OF VENA CAVA FILTER N/A 07/16/2012   Procedure: INSERTION OF VENA CAVA FILTER;  Surgeon: [name], MD;  Location: [facility] CATH LAB;  Service: Cardiovascular;  Laterality: N/A;   KNEE ARTHROSCOPY     LEFT HEART CATH AND CORONARY ANGIOGRAPHY N/A 01/01/2021   Procedure: LEFT HEART CATH AND CORONARY ANGIOGRAPHY;  Surgeon: Kathleene Hazel, MD;  Location: [facility] INVASIVE CV LAB;  Service: Cardiovascular;  Laterality: N/A;   LEFT HEART CATH AND CORONARY ANGIOGRAPHY N/A 04/29/2022   Procedure: LEFT HEART CATH AND CORONARY ANGIOGRAPHY;  Surgeon: Orbie Pyo, MD;  Location: [facility] INVASIVE CV LAB;  Service: Cardiovascular;  Laterality: N/A;   PARTIAL COLECTOMY N/A 06/03/2014   Procedure: PARTIAL COLECTOMY;  Surgeon: Harriette Bouillon, MD;  Location: [facility] OR;  Service: General;  Laterality: N/A;   REDUCTION MAMMAPLASTY Bilateral    RIGHT HEART CATH N/A 12/30/2020   Procedure: RIGHT HEART CATH;  Surgeon: Kathleene Hazel, MD;  Location: [facility] INVASIVE CV LAB;  Service: Cardiovascular;  Laterality: N/A;   SP ARTHRO HIP*L*       Social History:   reports that she quit smoking about 54 years ago. Her smoking use included cigarettes. She started smoking about 64 years ago. She has a 10 pack-year smoking history. She has never used smokeless tobacco. She reports that she does not drink alcohol and does not use drugs.   Family History:  Her family history includes Breast cancer in her mother.   Allergies Allergies  Allergen Reactions   Augmentin [Amoxicillin-Pot Clavulanate] Itching and Other (See Comments)    Severe vaginal itching   Tranxene [Clorazepate] Itching   Crestor [Rosuvastatin]     Made her sick on stomach, she is able to tolerate the zocor   Penicillins Itching and Rash     Home Medications  Prior to Admission medications   Medication Sig Start Date End Date Taking? Authorizing Provider  acetaminophen (TYLENOL) 325 MG tablet Take 2 tablets (650 mg total) by mouth every  6 (six) hours as needed for mild pain (or Fever >/= 101). 08/28/22   Simonne Martinet, NP  acetaminophen (TYLENOL) 650 MG suppository Place 1 suppository (650 mg total) rectally every 6 (six) hours as needed for mild pain (or Fever >/= 101). 08/28/22   Simonne Martinet, NP  DULoxetine (CYMBALTA) 30 MG capsule Take 1 capsule (30 mg total) by mouth daily. 08/29/22   Simonne Martinet, NP  HYDROmorphone (DILAUDID) 1 MG/ML injection Inject 0.25 mLs (0.25 mg total) into the vein every 2 (two) hours as needed for severe pain. 08/28/22   Simonne Martinet, NP  levothyroxine (SYNTHROID) 25 MCG tablet Take 25 mcg by mouth daily before breakfast.    [provider]  nitroGLYCERIN (NITROSTAT) 0.4 MG SL tablet Place 1 tablet (0.4 mg total) under the tongue every 5 (five) minutes as needed for chest pain. 08/28/22   Simonne Martinet, NP  norepinephrine (LEVOPHED) 4-5 MG/250ML-% SOLN Inject 0-40 mcg/min into the vein continuous. 08/28/22   Simonne Martinet, NP  ondansetron [facility]) 4 MG/2ML SOLN injection Inject 2 mLs (4 mg total) into the vein every 6 (six) hours as needed for nausea or vomiting. 08/28/22   Simonne Martinet, NP  pantoprazole (PROTONIX) 40 MG injection Inject 40 mg into the vein daily. 08/28/22   Simonne Martinet, NP  sodium bicarbonate 650 MG tablet Take 1 tablet (650 mg total) by mouth 3 (three) times daily. 08/28/22   Simonne Martinet, NP     Critical care time: 65 minutes     Harlon Ditty, AGACNP-BC [facility] Prefer epic messenger for cross cover needs If after hours, please call E-link

## 2022-09-30 NOTE — Sepsis Progress Note (Signed)
Notified bedside nurse of need to administer fluid bolus. Secure chat with Elana RN, who stated that a blood product was given and fluids were held due to few access sites. Restarted fluids just prior to our conversation.

## 2022-09-30 NOTE — ED Notes (Signed)
Started an transfer to [facility] for ED [address] Mrs Hannah [name] faxed the Demo sheet and the images has already be power shared. Waiting now on the doctor to call back.

## 2022-09-30 NOTE — Progress Notes (Signed)
eLink Physician-Brief Progress Note Patient Name: Hannah [name] DOB: 31-Jan-1940 MRN: [id number]   Date of Service  09/18/2022  HPI/Events of Note  Complicated patient with C1-C2 fracture following a fall and prolonged hospitalization at [facility], recently discharged and now admitted with multi-factorial shock and altered mental status. Work up is in progress.  eICU Interventions  New Patient Evaluation.        Thomasene Lot Aadith Raudenbush 09/24/2022, 9:07 PM

## 2022-09-30 NOTE — Progress Notes (Signed)
Attempted to call ER to get report x3, either no answer or the phone was hung up.

## 2022-09-30 NOTE — ED Provider Notes (Signed)
-----------------------------------------  5:20 PM on 09/23/2022 -----------------------------------------  I took over care of this patient from Dr. Vicente Males.  I discussed with the patient's family member who advised that the patient is DNR.  I consulted and discussed case with Dr. Karna Christmas from the ICU who will plan to admit the patient.     Dionne Bucy, MD 09/17/2022 1721

## 2022-10-01 ENCOUNTER — Inpatient Hospital Stay: Payer: Medicare Other

## 2022-10-01 DIAGNOSIS — S13101D Dislocation of unspecified cervical vertebrae, subsequent encounter: Secondary | ICD-10-CM

## 2022-10-01 DIAGNOSIS — Z515 Encounter for palliative care: Secondary | ICD-10-CM | POA: Diagnosis not present

## 2022-10-01 DIAGNOSIS — R579 Shock, unspecified: Secondary | ICD-10-CM | POA: Diagnosis not present

## 2022-10-01 DIAGNOSIS — S13101A Dislocation of unspecified cervical vertebrae, initial encounter: Secondary | ICD-10-CM

## 2022-10-01 LAB — GLUCOSE, CAPILLARY
Glucose-Capillary: 112 mg/dL — ABNORMAL HIGH (ref 70–99)
Glucose-Capillary: 122 mg/dL — ABNORMAL HIGH (ref 70–99)
Glucose-Capillary: 74 mg/dL (ref 70–99)
Glucose-Capillary: 72 mg/dL (ref 70–99)
Glucose-Capillary: 54 mg/dL — ABNORMAL LOW (ref 70–99)
Glucose-Capillary: 57 mg/dL — ABNORMAL LOW (ref 70–99)
Glucose-Capillary: 69 mg/dL — ABNORMAL LOW (ref 70–99)
Glucose-Capillary: 92 mg/dL (ref 70–99)

## 2022-10-01 LAB — RENAL FUNCTION PANEL
Albumin: 1.9 g/dL — ABNORMAL LOW (ref 3.5–5.0)
Anion gap: 8 (ref 5–15)
BUN: 50 mg/dL — ABNORMAL HIGH (ref 8–23)
CO2: 24 mmol/L (ref 22–32)
Calcium: 8 mg/dL — ABNORMAL LOW (ref 8.9–10.3)
Chloride: 100 mmol/L (ref 98–111)
Creatinine, Ser: 1.27 mg/dL — ABNORMAL HIGH (ref 0.44–1.00)
GFR, Estimated: 42 mL/min — ABNORMAL LOW (ref >60)
Glucose, Bld: 121 mg/dL — ABNORMAL HIGH (ref 70–99)
Phosphorus: 3.1 mg/dL (ref 2.5–4.6)
Potassium: 4.8 mmol/L (ref 3.5–5.1)
Sodium: 132 mmol/L — ABNORMAL LOW (ref 135–145)

## 2022-10-01 LAB — CBC
HCT: 21.6 % — ABNORMAL LOW (ref 36.0–46.0)
Hemoglobin: 6.6 g/dL — ABNORMAL LOW (ref 12.0–15.0)
MCH: 28.8 pg (ref 26.0–34.0)
MCHC: 30.6 g/dL (ref 30.0–36.0)
MCV: 94.3 fL (ref 80.0–100.0)
Platelets: 334 10*3/uL (ref 150–400)
RBC: 2.29 MIL/uL — ABNORMAL LOW (ref 3.87–5.11)
RDW: 16.5 % — ABNORMAL HIGH (ref 11.5–15.5)
WBC: 22.2 10*3/uL — ABNORMAL HIGH (ref 4.0–10.5)
nRBC: 0.6 % — ABNORMAL HIGH (ref 0.0–0.2)

## 2022-10-01 LAB — PHOSPHORUS: Phosphorus: 3 mg/dL (ref 2.5–4.6)

## 2022-10-01 LAB — PREPARE RBC (CROSSMATCH)

## 2022-10-01 LAB — MAGNESIUM: Magnesium: 2.1 mg/dL (ref 1.7–2.4)

## 2022-10-01 MED ORDER — SODIUM CHLORIDE 0.9% IV SOLUTION: Freq: Once | INTRAVENOUS | Status: DC

## 2022-10-01 MED ORDER — DEXTROSE 5 % IV SOLN: INTRAVENOUS | Status: DC

## 2022-10-01 MED ORDER — PANTOPRAZOLE SODIUM 40 MG IV SOLR
40.0000 mg | INTRAVENOUS | Status: DC
  Administered 2022-10-01 – 2022-10-02 (×2): 40 mg via INTRAVENOUS
  Filled 2022-10-01 (×2): qty 10

## 2022-10-01 MED ORDER — INSULIN ASPART 100 UNIT/ML IJ SOLN: 0.0000 [IU] | INTRAMUSCULAR | Status: DC

## 2022-10-01 MED ORDER — SODIUM CHLORIDE 0.9 % IV SOLN
2.0000 g | Freq: Every day | INTRAVENOUS | Status: DC
  Administered 2022-10-01 – 2022-10-02 (×2): 2 g via INTRAVENOUS
  Filled 2022-10-01 (×2): qty 20

## 2022-10-01 MED ORDER — DEXTROSE 50 % IV SOLN
25.0000 g | Freq: Once | INTRAVENOUS | Status: AC
  Administered 2022-10-01: 25 g via INTRAVENOUS
  Filled 2022-10-01: qty 50

## 2022-10-01 NOTE — ED Notes (Signed)
Harlon Ditty, NP states he changed pt's levo drip while at bedside. States he will notify ICU to titrate to MAP of 65.

## 2022-10-01 NOTE — Progress Notes (Signed)
PHARMACY CONSULT NOTE   Pharmacy Consult for Electrolyte Monitoring and Replacement   Recent Labs: Potassium (mmol/L)  Date Value  10/01/2022 4.8   Magnesium (mg/dL)  Date Value  56/43/3295 2.1   Calcium (mg/dL)  Date Value  18/84/1660 8.0 (L)   Albumin (g/dL)  Date Value  63/01/6008 1.9 (L)   Phosphorus (mg/dL)  Date Value  93/23/5573 3.1   Sodium (mmol/L)  Date Value  10/01/2022 132 (L)  06/29/2022 142    Assessment: 82 year old female with a past medical history significant for recent C1/C2 fracture and central cord syndrome approximately 1 month ago following a fall at home status post laminectomy at [facility], PE, HFpEF, CKD Stage IIIa, DM Type 2, HTN, paroxsymal A.fib, and Hypothyroidism who presents to [facility] ED on 09/11/2022 due to reported hemoglobin of 6.8 at [facility] healthcare facility.  Patient is currently altered and unable to contribute to history, therefore history obtained from chart review along with report from patient's daughter via telephone.   Goal of Therapy:  Electrolytes WNL  Plan:  No replacement needed. F/u with AM labs.   Ronnald Ramp ,PharmD Clinical Pharmacist 10/01/2022 9:33 AM

## 2022-10-01 NOTE — Consult Note (Addendum)
Subjective:   CC: Iliopsoas hematoma  HPI:  Hannah [name] is a 82 y.o. female who was consulted by [facility] for evaluation of above.  Per chart review and verbal discussion with the admitting ED provider, pt initially presented from [facility] healthcare for concerns of low hemoglobin level on her most recent labs.  Workup noted a unstable C1-C2 fracture.  She recently underwent surgical repair of this area at [facility].  [facility] neurosurgery team declined transfer since there was no surgical intervention available for her specific case, so she was transferred to ICU for management of low hemoglobin and concerns for possible sepsis.  She was given 1 unit packed red blood cells and her hemoglobin temporarily increased but then decreased again.  CT abdomen pelvis was ordered per ICU team to look for further source of bleeding and/or sepsis.  A iliopsoas hematoma was noted on the CT. Surgery consulted for further management  Pt at time of consultation can barely answer any questions.  Only says yes or no to whether or not she is in pain.   Past Medical History:  has a past medical history of Acute massive pulmonary embolism (HCC) (07/13/2012), Anticoagulated on warfarin, Chronic diastolic CHF (congestive heart failure) (HCC) (03/15/2017), Chronic kidney disease, stage 3a (HCC) (12/15/2018), Diabetes mellitus, type 2 (HCC) (07/15/2012), DM2 (diabetes mellitus, type 2) (HCC) (07/23/2012), HFrEF (heart failure with reduced ejection fraction) (HCC) (03/15/2017), Hyperlipemia (07/14/2012), Hyperlipidemia (02/25/2013), Hypertension (07/14/2012), Hypothyroid (06/06/2013), Large bowel stricture (HCC), Neuropathy (02/12/2015), Osteoarthritis, PAF (paroxysmal atrial fibrillation) (HCC), Pulmonary hypertension (HCC) (07/26/2016), Syncope (10/2018), and Thyroid disease.  Past Surgical History:  has a past surgical history that includes SP ARTHRO HIP*L*; Knee arthroscopy; insertion of vena cava filter (N/A, 07/16/2012); Flexible sigmoidoscopy  (N/A, 05/30/2014); Partial colectomy (N/A, 06/03/2014); Colostomy (N/A, 06/03/2014); Colonoscopy (N/A, 03/12/2016); Reduction mammaplasty (Bilateral); RIGHT HEART CATH (N/A, 12/30/2020); LEFT HEART CATH AND CORONARY ANGIOGRAPHY (N/A, 01/01/2021); and LEFT HEART CATH AND CORONARY ANGIOGRAPHY (N/A, 04/29/2022).  Family History: family history includes Breast cancer in her mother.  Social History:  reports that she quit smoking about 54 years ago. Her smoking use included cigarettes. She started smoking about 64 years ago. She has a 10 pack-year smoking history. She has never used smokeless tobacco. She reports that she does not drink alcohol and does not use drugs.  Current Medications:  Prior to Admission medications   Medication Sig Start Date End Date Taking? Authorizing Provider  acetaminophen (TYLENOL) 325 MG tablet Take 2 tablets (650 mg total) by mouth every 6 (six) hours as needed for mild pain (or Fever >/= 101). 08/28/22  Yes Simonne Martinet, NP  acetaminophen (TYLENOL) 650 MG suppository Place 1 suppository (650 mg total) rectally every 6 (six) hours as needed for mild pain (or Fever >/= 101). 08/28/22  Yes Simonne Martinet, NP  atorvastatin (LIPITOR) 40 MG tablet Take 40 mg by mouth daily.   Yes [provider]  Baclofen 5 MG TABS Take 5 mg by mouth every 8 (eight) hours as needed (Pain). 09/27/22 10/27/22 Yes [provider]  cyanocobalamin (VITAMIN B12) 1000 MCG tablet Take 1,000 mcg by mouth daily.   Yes [provider]  dapagliflozin propanediol (FARXIGA) 10 MG TABS tablet Take 10 mg by mouth daily.   Yes [provider]  DULoxetine (CYMBALTA) 30 MG capsule Take 1 capsule (30 mg total) by mouth daily. 08/29/22  Yes Simonne Martinet, NP  enoxaparin (LOVENOX) 80 MG/0.8ML injection Inject 80 mg into the skin every 12 (twelve) hours. 09/27/22 12/24/22  Yes [provider]  ferrous sulfate 325 (65 FE) MG tablet Take 325 mg by mouth daily with breakfast.    Yes [provider]  furosemide (LASIX) 20 MG tablet Take 20 mg by mouth daily as needed for fluid or edema. 09/27/22  Yes [provider]  Insulin Lispro-aabc (LYUMJEV KWIKPEN) 100 UNIT/ML KwikPen Inject 0-12 Units into the skin every 6 (six) hours.   Yes [provider]  levothyroxine (SYNTHROID) 25 MCG tablet Take 25 mcg by mouth daily before breakfast.   Yes [provider]  lidocaine 4 % Place 1 patch onto the skin daily.   Yes [provider]  magnesium hydroxide (MILK OF MAGNESIA) 400 MG/5ML suspension Take 30 mLs by mouth at bedtime. 09/27/22 10/07/22 Yes [provider]  nitroGLYCERIN (NITROSTAT) 0.4 MG SL tablet Place 1 tablet (0.4 mg total) under the tongue every 5 (five) minutes as needed for chest pain. 08/28/22  Yes Simonne Martinet, NP  omeprazole (KONVOMEP) 2 mg/mL SUSP oral suspension Take 20 mg by mouth daily. 09/27/22  Yes [provider]  ondansetron (ZOFRAN) 4 MG/2ML SOLN injection Inject 2 mLs (4 mg total) into the vein every 6 (six) hours as needed for nausea or vomiting. 08/28/22  Yes Simonne Martinet, NP  oxyCODONE (ROXICODONE) 5 MG/5ML solution Take 5 mg by mouth every 6 (six) hours as needed for moderate pain. 09/27/22 10/02/22 Yes [provider]  pantoprazole (PROTONIX) 40 MG tablet Take 40 mg by mouth daily.   Yes [provider]  sacubitril-valsartan (ENTRESTO) 24-26 MG Take 1 tablet by mouth every 12 (twelve) hours.   Yes [provider]  spironolactone (ALDACTONE) 25 MG tablet Take 12.5 mg by mouth daily.   Yes [provider]  HYDROmorphone (DILAUDID) 1 MG/ML injection Inject 0.25 mLs (0.25 mg total) into the vein every 2 (two) hours as needed for severe pain. Patient not taking: Reported on 09/22/2022 08/28/22   Simonne Martinet, NP  norepinephrine (LEVOPHED) 4-5 MG/250ML-% SOLN Inject 0-40 mcg/min into the vein continuous. Patient not taking: Reported on 09/23/2022 08/28/22    Simonne Martinet, NP  pantoprazole (PROTONIX) 40 MG injection Inject 40 mg into the vein daily. Patient not taking: Reported on 09/05/2022 08/28/22   Simonne Martinet, NP  sodium bicarbonate 650 MG tablet Take 1 tablet (650 mg total) by mouth 3 (three) times daily. Patient not taking: Reported on 09/15/2022 08/28/22   Simonne Martinet, NP    Allergies:  Allergies  Allergen Reactions   Augmentin [Amoxicillin-Pot Clavulanate] Itching and Other (See Comments)    Severe vaginal itching   Tranxene [Clorazepate] Itching   Crestor [Rosuvastatin]     Made her sick on stomach, she is able to tolerate the zocor   Penicillins Itching and Rash    ROS:  N/a   Objective:     BP 121/60   Pulse 100   Temp 98.4 F (36.9 C)   Resp 19   Wt 91.2 kg   SpO2 96%   BMI 31.49 kg/m   Constitutional :  alert, cooperative, appears stated age, and no distress  Lymphatics/Throat:  Neck collar in place.  Respiratory:  clear to auscultation bilaterally  Cardiovascular:  regular rate and rhythm  Gastrointestinal: soft, non-tender; bowel sounds normal; no masses,  no organomegaly.     Skin: Cool and moist, darker discoloration noted under left eye          LABS:     Latest Ref Rng &  Units 10/01/2022    3:55 AM 09/27/2022   10:02 PM 09/06/2022    8:47 AM  CMP  Glucose 70 - 99 mg/dL 540  981  191   BUN 8 - 23 mg/dL 50  53  62   Creatinine 0.44 - 1.00 mg/dL 4.78  2.95  6.21   Sodium 135 - 145 mmol/L 132  132  129   Potassium 3.5 - 5.1 mmol/L 4.8  4.8  5.8   Chloride 98 - 111 mmol/L 100  99  92   CO2 22 - 32 mmol/L 24  24  25    Calcium 8.9 - 10.3 mg/dL 8.0  8.2  8.7   Total Protein 6.5 - 8.1 g/dL   5.9   Total Bilirubin 0.3 - 1.2 mg/dL   0.6   Alkaline Phos 38 - 126 U/L   67   AST 15 - 41 U/L   25   ALT 0 - 44 U/L   14       Latest Ref Rng & Units 10/01/2022    3:54 AM 09/24/2022   10:02 PM 09/15/2022    8:47 AM  CBC  WBC 4.0 - 10.5 K/uL 22.2   27.5   Hemoglobin 12.0 - 15.0 g/dL 6.6  7.7  6.3    Hematocrit 36.0 - 46.0 % 21.6  23.6  21.4   Platelets 150 - 400 K/uL 334   378     RADS: CLINICAL DATA:  Intra-abdominal infection/peritonitis.   EXAM: CT ABDOMEN AND PELVIS WITHOUT CONTRAST   TECHNIQUE: Multidetector CT imaging of the abdomen and pelvis was performed following the standard protocol without IV contrast.   RADIATION DOSE REDUCTION: This exam was performed according to the departmental dose-optimization program which includes automated exposure control, adjustment of the mA and/or kV according to patient size and/or use of iterative reconstruction technique.   COMPARISON:  CT abdomen pelvis dated 04/30/2020.   FINDINGS: Lower chest: There is a small to moderate right pleural effusion with associated atelectasis. There is mild left basilar atelectasis.   Hepatobiliary: No focal liver abnormality is seen. No gallstones, gallbladder wall thickening, or biliary dilatation.   Pancreas: Unremarkable. No pancreatic ductal dilatation or surrounding inflammatory changes.   Spleen: Normal in size without focal abnormality.   Adrenals/Urinary Tract: Adrenal glands are unremarkable. Kidneys are normal, without renal calculi, suspicious focal lesion, or hydronephrosis. A Foley catheter is seen in the bladder.   Stomach/Bowel: A percutaneous gastrostomy tube terminates in the antrum. The patient is status post a Hartmann's pouch and end colostomy in the left lower quadrant. There is a parastomal hernia containing a loop of small bowel and fat, similar to prior exam. Enteric contrast reaches the ostomy. Appendix appears normal. No evidence of bowel wall thickening, distention, or inflammatory changes.   Vascular/Lymphatic: Aortic atherosclerosis. An infrarenal inferior vena cava filter is noted. No enlarged abdominal or pelvic lymph nodes.   Reproductive: Status post hysterectomy. No adnexal masses.   Other: Anasarca is noted.   Musculoskeletal: There is  enlargement and heterogeneity of the right iliopsoas muscles with associated fat stranding. The patient is status post a left hip arthroplasty. Degenerative changes are seen in the spine.   IMPRESSION: 1. Enlargement and heterogeneity of the right iliopsoas muscles with associated fat stranding is suspicious for an intramuscular hematoma. 2. Small to moderate right pleural effusion with associated atelectasis. 3. Anasarca.   Aortic Atherosclerosis (ICD10-I70.0).     Electronically Signed   By: Foye Spurling.D.  On: 10/01/2022 09:44   CLINICAL DATA:  Mental status change, unknown cause; Neck trauma (Age >= 65y).   EXAM: CT HEAD WITHOUT CONTRAST   CT CERVICAL SPINE WITHOUT CONTRAST   TECHNIQUE: Multidetector CT imaging of the head and cervical spine was performed following the standard protocol without intravenous contrast. Multiplanar CT image reconstructions of the cervical spine were also generated.   RADIATION DOSE REDUCTION: This exam was performed according to the departmental dose-optimization program which includes automated exposure control, adjustment of the mA and/or kV according to patient size and/or use of iterative reconstruction technique.   COMPARISON:  CT head and cervical spine 08/25/2022. MRI head 07/24/2022. MRI cervical spine 08/25/2022.   FINDINGS: CT HEAD FINDINGS   Brain: There is no evidence of an acute infarct, intracranial hemorrhage, mass, midline shift, or extra-axial fluid collection. Advanced cerebral atrophy and ventriculomegaly are unchanged. Patchy hypodensities in the cerebral white matter are unchanged and nonspecific but compatible with moderate chronic small vessel ischemic disease.   Vascular: Calcified atherosclerosis at the skull base. No hyperdense vessel.   Skull: No acute fracture or suspicious osseous lesion.   Sinuses/Orbits: No evidence of acute inflammation in the included portions of the paranasal sinuses.  New bilateral mastoid and middle ear effusions, right larger than left. Unremarkable orbits.   Other: Small right frontal scalp hematoma, decreased in size from 08/25/2022.   CT CERVICAL SPINE FINDINGS   Alignment: Straightening of the normal lordosis in the mid to lower cervical spine. Left convex cervical spine curvature.   Skull base and vertebrae: Known recent fracture of the base of the dens with increased posterior angulation of the dens relative to the C2 vertebral body compared to the prior CT. Known bilateral anterior C1 arch fractures with increased displacement compared to the prior CT. Subluxation of C1 relative to C2 on the prior CT has progressed, and the C1-2 articulations are now completely dislocated with the lateral masses of C1 displaced posterior to C2. Postsurgical changes are noted, with a new spacer located between the posterior ring of C1 and C2 spinous process and with laminectomies having been performed from C3-C6 with plate and screws in place.   Soft tissues and spinal canal: No significant prevertebral fluid. Postoperative changes in the posterior neck soft tissues including fluid collections from C1-C6, incompletely evaluated in the absence of IV contrast.   Disc levels: Severe spinal stenosis at C1-2 related to the fractures and posterior subluxation of C1 relative to C2 as well as ligamentous thickening and calcification posterior to the dens. 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   Upper chest: No mass or consolidation in the included lung apices.   Other: None.   IMPRESSION: 1. Known recent C1 and C2 fractures with increased fracture displacement and angulation including new complete posterior dislocation of the lateral masses of C1 relative to C2. Resultant severe spinal stenosis at C1-2. 2. No evidence of acute intracranial abnormality. 3. Moderate chronic small  vessel ischemic disease and advanced cerebral atrophy. 4. New bilateral mastoid and middle ear effusions.     Electronically Signed   By: Sebastian Ache M.D.   On: 10-10-2022 11:26  Assessment:    Iliopsoas hematoma in a patient with persistent drop in serial hemoglobin levels, status post packed red blood cell transfusion  Worsening cervical fracture as noted above.   Plan:    There is clinical concerns of ongoing active bleeding, with source likely at the iliopsoas muscle as noted on  CT above.  She was on therapeutic Lovenox for recent diagnosis of PE, however, she also presented with worsening dislocation of her C1 and C2.  I did confirm with ER admitting provider and review of charts that there was no documentation of a fall, but the above issues combined raises some concern.  Updated family members.  Briefly discussed goals of care and son requested we continue with additional intervention as needed regarding her dropping hemoglobin levels.    Recommended evaluation by interventional radiology initially to see if there is any possibility of a embolization procedure.  Proceeding with any open surgical interventions at this point is too high risk for this patient.  Discussed case with IR provider on-call and he recommended a CT angiogram initially to see if there is any active extravasation that can be targeted for procedural intervention.  Continue supportive care per ICU team in the meantime.  No additional coagulopathies noted currently.  And Lovenox has been held since admission.  Surgery will continue to follow and update team members as well as family members as needed.  Update: Noted in chart review the patient has transitioned to comfort care.  Surgery to sign off.  labs/images/medications/previous chart entries reviewed personally and relevant changes/updates noted above.

## 2022-10-01 NOTE — Progress Notes (Signed)
Dear Doctor: Karna Christmas This patient has been identified as a candidate for PICC for the following reason (s): poor veins/poor circulatory system (CHF, COPD, emphysema, diabetes, steroid use, IV drug abuse, etc.) If you agree, please write an order for the indicated device. For any questions contact the Vascular Access Team at [phone] if no answer, please leave a message.  Thank you for supporting the early vascular access assessment program.  Talked Dr. Karna Christmas about finding of assessment both arms for PIV access by [place]. Both arms very swollen and no suitable veins for 20G PIV access. Possible Midline on Lt. Basilic, but not sure IV contrast may go via Midline. Patient had PICC on Lt. Basilic previous admission. Recommended PICC by IV team or IR. HS [employer]

## 2022-10-01 NOTE — ED Notes (Signed)
Pt. Repositioned for comfort. Pillows under bilateral arms repositioned for comfort. Peri care provided without rolling pt. Per Dr. Vicente Males.

## 2022-10-01 NOTE — TOC Progression Note (Addendum)
Transition of Care [facility]) - Progression Note    Patient Details  Name: Hannah [name] MRN: [id number] Date of Birth: 1940/06/29  [facility]) CM/SW Contact  Hannah [name], [place] Phone Number: 10/01/2022, 4:09 PM  Clinical Narrative:      CSW received a message from NP-Taylor regarding the patient transitioning to comfort care, she inquired about contacting the red cross for the patient's grandson Alvy Bimler) who is an army to have him pardoned. CSW advised she is unsure if this is within scope of practice, TOC spoke with patient's daughter and relayed the same info, they requested to check with an immediate supervisor. CSW called Brenda-TOC she advised we can reach out. CSW called [phone] and spoke with Louwis, completed required information with permission from family regarding patients condition. She advised she needed to confirm my employment with hospital, I provided her Taylor-NP's number to confirm.  I also gave her TOC contact # for f/u and advised any updates should be forwarded to that line as I will not be here tomorrow or during the week. Case ID: [id number]. CSW called [name] (patient's daughter) to relay update, left message      Expected Discharge Plan and Services                                               Social Determinants of Health (SDOH) Interventions SDOH Screenings   Food Insecurity: No Food Insecurity (09/15/2022)   Received from [facility]  Housing: Low Risk  (05/20/2022)  Transportation Needs: No Transportation Needs (09/15/2022)   Received from [facility]  Utilities: Not At Risk (09/15/2022)   Received from [facility]  Alcohol Screen: Low Risk  (04/29/2022)  Depression (PHQ2-9): Low Risk  (09/13/2021)  Financial Resource Strain: Low Risk  (09/15/2022)   Received from [facility]  Physical Activity: Insufficiently Active (06/24/2021)  Social  Connections: Moderately Integrated (06/24/2021)  Stress: No Stress Concern Present (06/24/2021)  Tobacco Use: Medium Risk (09/28/2022)    Readmission Risk Interventions    01/04/2021    1:14 PM  Readmission Risk Prevention Plan  Transportation Screening Complete  PCP or Specialist Appt within 5-7 Days --  Home Care Screening Complete  Medication Review (RN CM) Complete

## 2022-10-01 NOTE — Progress Notes (Signed)
Lab is unable to get h&h. Zada Girt was made aware.

## 2022-10-01 NOTE — ED Notes (Signed)
Pt's daughters at bedside, updated on POC and admission details.

## 2022-10-01 NOTE — ED Notes (Signed)
Pt's daughters to bedside, updated on POC and pt. Condition.

## 2022-10-01 NOTE — TOC Initial Note (Signed)
Transition of Care [facility]) - Initial/Assessment Note    Patient Details  Name: Hannah [name] MRN: [id number] Date of Birth: 10/23/1940  Transition of Care [facility]) CM/SW Contact:    Colette Ribas, LCSWA Phone Number: 10/01/2022, 2:46 PM  Clinical Narrative:                  CSW went to patient bedside to complete HRA, patient was alseep and there was not any family at bedside.       Patient Goals and CMS Choice            Expected Discharge Plan and Services                                              Prior Living Arrangements/Services                       Activities of Daily Living      Permission Sought/Granted                  Emotional Assessment              Admission diagnosis:  Shock (HCC) [R57.9] Closed dislocation of cervical vertebra, initial encounter [S13.101A] Closed nondisplaced odontoid fracture with type II morphology and routine healing, subsequent encounter [S12.112D] Closed unstable burst fracture of first cervical vertebra with routine healing, subsequent encounter [S12.02XD] Sepsis, due to unspecified organism, unspecified whether acute organ dysfunction present [facility]) [A41.9] Patient Active Problem List   Diagnosis Date Noted   Closed dislocation of cervical vertebra 10/01/2022   Shock (HCC) 10/23/2022   Central cord syndrome (HCC) 08/28/2022   Neurogenic shock due to traumatic injury (HCC) 08/28/2022   C1 cervical fracture (HCC) 08/25/2022   C2 cervical fracture (HCC) 08/25/2022   Supratherapeutic INR 08/25/2022   Fall at home, initial encounter 08/25/2022   High anion gap metabolic acidosis 08/25/2022   Lactic acidosis 08/25/2022   Chronic diastolic CHF (congestive heart failure) (HCC) 08/25/2022   Subarachnoid hematoma (HCC) 08/24/2022   Depression, major, single episode, mild (HCC) 07/25/2022   Weakness of right lower extremity 04/25/2022   HTN (hypertension) 04/25/2022   Chronic systolic CHF  (congestive heart failure) (HCC) 04/25/2022   Lower extremity weakness 04/25/2022   Right sided weakness 04/25/2022   S/P colostomy (HCC) 04/25/2022   NSTEMI (non-ST elevated myocardial infarction) (HCC) 04/25/2022   Elevated troponin    Dilated cardiomyopathy (HCC)    Ischemic cardiomyopathy    Paroxysmal atrial fibrillation (HCC)    Non-ST elevation (NSTEMI) myocardial infarction (HCC) 12/25/2020   Thrombocytopenia (HCC) 04/23/2020   CAD (coronary artery disease) 06/24/2019   Hypertension 04/25/2019   Atypical chest pain 12/15/2018   CKD stage 3a, GFR 45-59 ml/min (HCC) 12/15/2018   Frequent falls 05/21/2018   HFimpEF (heart failure with improved ejection fraction) (HCC) 03/15/2017   Anticoagulated on warfarin    Pulmonary hypertension (HCC) 07/26/2016   Neuropathy 02/12/2015   History of pulmonary embolism 06/10/2013   Acquired hypothyroidism 06/06/2013   Diabetes mellitus with neurological manifestations, controlled (HCC) 06/06/2013   Hyperlipidemia 02/25/2013   DM2 (diabetes mellitus, type 2) (HCC) 07/23/2012   PCP:  [name], MD Pharmacy:   [facility] - [place] - [address] [place]) [place] [address] Phone: [phone] Fax: [phone]  Social Determinants of Health (SDOH) Social History: SDOH Screenings   Food Insecurity: No Food Insecurity (09/15/2022)   Received from [facility]  Housing: Low Risk  (05/20/2022)  Transportation Needs: No Transportation Needs (09/15/2022)   Received from [facility]  Utilities: Not At Risk (09/15/2022)   Received from [facility]  Alcohol Screen: Low Risk  (04/29/2022)  Depression (PHQ2-9): Low Risk  (09/13/2021)  Financial Resource Strain: Low Risk  (09/15/2022)   Received from [facility]  Physical Activity: Insufficiently Active (06/24/2021)  Social Connections: Moderately Integrated  (06/24/2021)  Stress: No Stress Concern Present (06/24/2021)  Tobacco Use: Medium Risk (09/28/2022)   SDOH Interventions:     Readmission Risk Interventions    01/04/2021    1:14 PM  Readmission Risk Prevention Plan  Transportation Screening Complete  PCP or Specialist Appt within 5-7 Days --  Home Care Screening Complete  Medication Review (RN CM) Complete

## 2022-10-01 NOTE — Plan of Care (Signed)

## 2022-10-01 NOTE — Progress Notes (Signed)
Most recent Hgb is 7.7, notified [place] NP, instructed to hold anymore blood transfusions.

## 2022-10-01 NOTE — Progress Notes (Signed)
Made IR aware patient has no access to perform CT angio and in AKI. Per IR he is okay with not getting CT angio. Also made Dr. Tonna Boehringer aware of the current issue and that family is discussing make Comfort care but waiting for all family to come.

## 2022-10-01 NOTE — ED Notes (Signed)
Pt. Repositioned for comfort, fresh warm blankets provided. Pt's ostomy bag burped and emptied. Moderate amount of light brown soft stool.

## 2022-10-01 NOTE — ED Notes (Signed)
Dr. Vicente Males discusses with this RN that pt. Has dislocated cervical fracture, he has spoken with neurosurgery at [facility] where pt. Was admitted for fracture previously, and told to not place C-collar on pt. Dr. Vicente Males notifies this RN, not to place c-collar, and do not roll pt.

## 2022-10-01 NOTE — Consult Note (Signed)
Consultation Note Date: 10/01/2022   Patient Name: Hannah [name]  DOB: 04-15-1940  MRN: [id number]  Age / Sex: 82 y.o., female  PCP: [name], MD Referring Physician: Vida Rigger, MD  Reason for Consultation: Establishing goals of care   HPI/Brief Hospital Course: 82 y.o. female  with past medical history of PE, HFpEF, CKD stage 3a, T2DM, HTN, paroxsymal atrial fibrillation and hypothyroidism admitted from [facility] on 09/09/2022 with anemia found on routine blood draw (hemoglobin 6.8).  Noted recent month long stay at [facility] s/p laminectomy for C1/C2 fracture with associated central cord syndrome and small subarachnoid hemorrhage after suffering a fall at home.  CT cervical spine 1. Known recent C1 and C2 fractures with increased fracture displacement and angulation including new complete posterior dislocation of the lateral masses of C1 relative to C2. Resultant severe spinal stenosis at C1-2.  CT abdomen/pelvis 1. Enlargement and heterogeneity of the right iliopsoas muscles with associated fat stranding is suspicious for an intramuscular hematoma. 2. Small to moderate right pleural effusion with associated atelectasis. 3. Anasarca.  Palliative medicine was consulted for assisting with goals of care conversations.  Subjective:  Extensive chart review has been completed prior to meeting patient including labs, vital signs, imaging, progress notes, orders, and available advanced directive documents from current and previous encounters.  Visited at bedside with Ms. Chiong, awake, alert and able to answer simple questions. No family at bedside. Call placed to daughter-Michelle, plan set to meet at bedside around 2pm.  Met with family including Michelle-daughter, Vanessa-daughter and Grandson in collaboration with Dr. Karna Christmas, meeting held outside of room per family request.  Introduced myself as a Publishing rights manager  as a member of the palliative care team. Explained palliative medicine is specialized medical care for people living with serious illness. It focuses on providing relief from the symptoms and stress of a serious illness. The goal is to improve quality of life for both the patient and the family.   Dr. Karna Christmas provided medical updates. Per CT cervical spine, worsening of fractures, team at [facility] has been contacted-notes and imaging reviewed and at this time cannot offer any additional surgical intervention. Fractures to remain, neck collar to remain in place, high risk for decompensation. NSGY team at [facility] unfortunately cannot offer intervention due to complexity of fracture. Source of bleeding felt to be related to developing hematoma-again high risk for decompensation, options remain limited at this time due to underlying cervical fractures. Possibility of IR attempting drainage of hematoma but without adequate IV access and underlying CKD as CTA needed for further work up prior to proceeding with possible intervention.  Space and opportunity given to family to ask questions.  [name] shares prior to initial fall resulting in cervical fractures, Ms. Fellenz lived at home independently. [name] and [name] have a brother-Benjamin who is a Civil engineer, contracting and is out of state at a game. Ms. Freimuth's wishes according to daughters have been for the 3 siblings to make decision jointly on her behalf.  Due to the multiple complex medical issues we discussed shifting our focus to comfort care. Ms. Schmelter would no longer receive aggressive medical interventions such as continuous vital signs, lab work, radiology testing, or medications not focused on comfort. All care would focus on how the patient is looking and feeling. This would include management of any symptoms that may cause discomfort, pain, shortness of breath, cough, nausea, agitation, anxiety, and/or secretions etc. Symptoms would be managed with  medications and other non-pharmacological  interventions such as spiritual support if requested, repositioning, music therapy, or therapeutic listening. Family verbalized understanding and appreciation.   At this time, [name] and [name] wish to continue current treatment plan, not escalating care and allowing their brother-Benjamin and other family members travel to visit at bedside prior to shifting to comfort care. They voice their understanding of the severity of the medical situation and aware of the risk of deterioration. They do wish for labs to continued to be monitored and for blood transfusions if needed until family able to arrive.  DNR/DNI confirmed.  [name] and [name] in agreement to treat pain and discomfort as needed with IV Fentanyl, as Ms. Stroman is unable to safely swallow at this time.  Family requesting help with contacting the [employer] Ms. Mealing's grandson to be able to visit as he is active in the army-information passed on to [facility] who is assisting with this process.    I discussed importance of continued conversations with family/support persons and all members of their medical team regarding overall plan of care and treatment options ensuring decisions are in alignment with patients goals of care.  All questions/concerns addressed. Emotional support provided to patient/family/support persons. PMT will continue to follow and support patient as needed.   Objective: Primary Diagnoses: Present on Admission:  Shock (HCC)   Vital Signs: BP 132/71   Pulse (!) 103   Temp 98.8 F (37.1 C)   Resp 15   Wt 91.2 kg   SpO2 96%   BMI 31.49 kg/m  Pain Scale: 0-10   Pain Score: 0-No pain  IO: Intake/output summary:  Intake/Output Summary (Last 24 hours) at 10/01/2022 1800 Last data filed at 10/01/2022 1739 Gross per 24 hour  Intake 1134.25 ml  Output 1625 ml  Net -490.75 ml    LBM: Last BM Date :  (PTA) Baseline Weight: Weight: 90.9 kg Most recent weight:  Weight: 91.2 kg      Assessment and Plan  SUMMARY OF RECOMMENDATIONS   DNR/DNI No escalation of care, would still be accepting of labs and blood transfusions Allowing time for family to travel in from out of state, likely to transition to full comfort care at that time  Palliative Prophylaxis:   Bowel Regimen, Delirium Protocol and Frequent Pain Assessment  Spiritual:  Desire for ongoing Chaplain support: No Education provided on Chaplain services offered through PMT, education provided on Grief/Bereavement support services   Discussed With: ICU team, nursing staff and [facility]   Thank you for this consult and allowing Palliative Medicine to participate in the care of Nayleen Cowing. Palliative medicine will continue to follow and assist as needed.   Time Total: 75 minutes  Time spent includes: Detailed review of medical records (labs, imaging, vital signs), medically appropriate exam (mental status, respiratory, cardiac, skin), discussed with treatment team, counseling and educating patient, family and staff, documenting clinical information, medication management and coordination of care.   Signed by: Leeanne Deed, DNP, AGNP-C Palliative Medicine    Please contact Palliative Medicine Team phone at [phone] for questions and concerns.  For individual provider: See Loretha Stapler

## 2022-10-01 NOTE — Progress Notes (Addendum)
NAME:  Hannah [name], MRN:  [id number], DOB:  [birth year], LOS: 1 ADMISSION DATE:  10-28-22, CONSULTATION DATE:  10-28-2022 REFERRING MD:  Dr. Marisa Severin, CHIEF COMPLAINT:  Acute Anemia   Brief Pt Description / Synopsis:  82 y.o. female, DNR/DNI, with recent C1/C2 fracture and central cord syndrome approximately 1 month ago following a fall at home status post laminectomy at [facility], now admitted with Multifactorial Shock (Hemorrhagic +septic), Severe Sepsis of unknown etiology, Acute Blood Loss Anemia, and worsening dislocation of the lateral massess of C1 and C2 with significant spinal stenosis.  History of Present Illness:  Hannah [name] is a 82 year old female with a past medical history significant for recent C1/C2 fracture and central cord syndrome approximately 1 month ago following a fall at home status post laminectomy at [facility], PE, HFpEF, CKD Stage IIIa, DM Type 2, HTN, paroxsymal A.fib, and Hypothyroidism who presents to [facility] ED on 10-28-2022 due to reported hemoglobin of 6.8 at [facility] healthcare facility.  Patient is currently altered and unable to contribute to history, therefore history obtained from chart review along with report from patient's daughter via telephone.  Per chart review she was discharged to [facility] healthcare 3 days ago following a month-long stay at [facility] for laminectomy.  She was sent to the ED on 9/25 due to bleeding in the right bicep area where PICC line was previously located (was removed prior to discharge at [facility]).  Hemostatic gauze and bandage were placed and she was discharged back to [facility] healthcare.  Of note, details of recent fracture are as follows: She had a fall on 8/21 with associated neck pain and bilateral weakness. She was admitted at [facility] where CT showed C1-C1 fracture and a small subarachnoid hemorrhage. MRI obtained showed C1 and C2 fracture, ossification of posterior longitudinal ligament, cervical spondylosis, cervical spinal cord injury,  central cord syndrome.  Initial plan by neuro surgery at [facility] was for posterior cervical decompression, instrumentation and fusion from C1-C6 to be done on 8/28, but it was felt she would be best served by medically stabilizing and correcting coagulopathy prior surgery.  However on 8/25 family requested she be transferred to [facility] for Neurosurgical intervention there.   ED Course: Initial Vital Signs: Temperature 98.6 F orally, pulse 97, respiratory 15, blood pressure 90/45, SpO2 92% Significant Labs: Sodium 129, potassium 5.8, glucose 174, BUN 62, creatinine 1.79, at bedtime troponin 10, lactic acid 1.9, WBC 27.5 with mild left shift, hemoglobin 6.3, INR 1.3, PT 16 ABG: pH 7.43/pCO2 43/pO2 53/bicarb 20.5 COVID-19 PCR is negative Imaging Chest X-ray>>1. Low lung volumes, otherwise no acute cardiopulmonary abnormality. Aortic Atherosclerosis (ICD10-I70.0). 2. Possible oral contrast mixed with stool in visible bowel. CT Head w/o contrast>>IMPRESSION: No evidence of acute intracranial abnormality.  Moderate chronic small vessel ischemic disease and advanced cerebral atrophy. New bilateral mastoid and middle ear effusions. CT Cervical Spine>>IMPRESSION: 1. Known recent C1 and C2 fractures with increased fracture displacement and angulation including new complete posterior dislocation of the lateral masses of C1 relative to C2. Resultant severe spinal stenosis at C1-2. Venous US RUE>>IMPRESSION: No evidence of DVT within the right upper extremity. Medications Administered: 3L NS boluses, Ceftriaxone and Vancomycin, 1g Calcium gluconate, 1 unit pRBCs  She met Sepsis criteria therefore cultures were drawn and she recevied broad spectrum ABX.  Given concern for dislocation of the lateral massess of C1 and C2 with significant spinal stenosis, ED provider Dr. Vicente Males spoke with Dr. Cherly Hensen of Neuro ICU at [facility] who discussed with Dr. Despina Pole Neurosurgeon  at [facility], NO surgical intervention to be  offered per [facility] thus pt not needing transfer.  Despite IV fluid resuscitation and 1 unit of blood she remained hypotensive requiring initiation of peripheral Levophed.  PCCM is asked to admit for further workup and treatment.    Please see "Significant Hospital Events" section below for full detailed hospital course.   10/01/22- patient with persistent worsening anemia post transfusion.  Will obtain mixing inhibitor study and haptoglobin, CT abd/pelvis to rule out additional sources.  Palliative care consultation today due to poor prognosis with unstable cervical neck fracture DNR patient with severe comorbid conditions. CT abd with iliopsoas hematoma likely culprit of acute blood loss.  Coagulopathy workup initiated.   Pertinent  Medical History   Past Medical History:  Diagnosis Date   Acute massive pulmonary embolism (HCC) 07/13/2012   Massive PE w/ PEA arrest 07/13/12 >TNK >IVC filter >discharged on comadin     Anticoagulated on warfarin    Chronic diastolic CHF (congestive heart failure) (HCC) 03/15/2017   Chronic kidney disease, stage 3a (HCC) 12/15/2018   Diabetes mellitus, type 2 (HCC) 07/15/2012   with peripheral neuropathy   DM2 (diabetes mellitus, type 2) (HCC) 07/23/2012   HFrEF (heart failure with reduced ejection fraction) (HCC) 03/15/2017   Non-ischemic cardiomyopathy // Cardiac catheterization 01/01/21:  RCA prox to mid 20 // Echocardiogram 12/26/20:  EF 30-35, ant septum/apex, mid inf-sept AK; ant-lat/ant/inf HK, Gr 1 DD, mild LVH, normal RVSF, RVSP 27.2, small effusion (ant to RV), mild MR, mod TR, AV sclerosis w/o AS   Hyperlipemia 07/14/2012   Hyperlipidemia 02/25/2013   Hypertension 07/14/2012   Hypothyroid 06/06/2013   Large bowel stricture (HCC)    s/p colectomy in 2016   Neuropathy 02/12/2015   Osteoarthritis    s/p hip and knee replacements   PAF (paroxysmal atrial fibrillation) (HCC)    Pulmonary hypertension (HCC) 07/26/2016   Syncope 10/2018   Thyroid disease      Micro Data:  9/27: SARS-CoV-2/flu/RSV PCR>> negative 9/27: Blood culture x 2>>  Antimicrobials:   Anti-infectives (From admission, onward)    Start     Dose/Rate Route Frequency Ordered Stop   10/01/22 1500  vancomycin (VANCOREADY) IVPB 750 mg/150 mL        750 mg 150 mL/hr over 60 Minutes Intravenous  Once 09/19/2022 1809     09/20/2022 1830  ceFEPIme (MAXIPIME) 2 g in sodium chloride 0.9 % 100 mL IVPB        2 g 200 mL/hr over 30 Minutes Intravenous Every 24 hours 09/23/2022 1805     09/21/2022 1315  vancomycin (VANCOREADY) IVPB 2000 mg/400 mL        2,000 mg 200 mL/hr over 120 Minutes Intravenous  Once 09/25/2022 1312 09/05/2022 1750   09/06/2022 0945  cefTRIAXone (ROCEPHIN) 2 g in sodium chloride 0.9 % 100 mL IVPB  Status:  Discontinued        2 g 200 mL/hr over 30 Minutes Intravenous Every 24 hours 09/15/2022 0935 09/05/2022 1744        Significant Hospital Events: Including procedures, antibiotic start and stop dates in addition to other pertinent events   9/27: Presented to ED for reports of Hgb 6.8 at facility.  Found to be hypotensive.  CT Cervical Spine with dislocation of the lateral massess of C1 and C2 with significant spinal strenosis.  [facility] states NO surgical intervention to be offered, thus pt not needing transfer.  Remained hypotensive requiring initiation of peripheral Levophed.  PCCM asked  to admit.    Objective   Blood pressure 109/84, pulse (!) 101, temperature 98.8 F (37.1 C), resp. rate 19, weight 91.2 kg, SpO2 94%.        Intake/Output Summary (Last 24 hours) at 10/01/2022 0834 Last data filed at 10/01/2022 0800 Gross per 24 hour  Intake 3236.89 ml  Output 1100 ml  Net 2136.89 ml   Filed Weights   09/09/2022 0839 09/17/2022 2030 10/01/22 7425  Weight: 90.9 kg 86.6 kg 91.2 kg    Examination: General: Acute on chronically ill-appearing female, laying in bed, on 2 L nasal cannula, no acute distress HENT: Atraumatic, normocephalic Lungs: Clear  breath sounds throughout, even, nonlabored Cardiovascular: Tachycardia, regular rhythm, S1-S2, no murmurs, rubs, gallops Abdomen: Obese, soft, nontender, nondistended, no guarding rebound tenderness, bowel sounds positive x 4 Extremities: Right upper extremity swollen, generalized weakness throughout Neuro: Awake and alert, oriented only to self and place, pupils PERRLA GU: Foley catheter in place draining yellow urine  Resolved Hospital Problem list     Assessment & Plan:   #Shock: Hypovolemic/Hemorrhagic + Septic +/- Autonomic dysfunction in setting of spine fracture PMHx: HFpEF, Paroxsymal A.fib, HTN, HLD, PE -Continuous cardiac monitoring -Maintain MAP >65 -IV fluids -Transfusions as indicated -Vasopressors as needed to maintain MAP goal -Trend lactic acid until normalized -Trend HS Troponin until peaked  #Meets SIRS Criteria upon Presentation (HR >90, WBC 27) #Severe Sepsis due to unknown source currently -Monitor fever curve -Trend WBC's & Procalcitonin -Follow cultures as above -Continue empiric Cefepime and Vancomycin pending cultures & sensitivities  #Acute Blood Loss Anemia, reported she was bleeding from previous PICC site -Monitor for S/Sx of bleeding -Trend CBC -SCD's for VTE Prophylaxis (avoid chemical ppx for now) -Transfuse for Hgb <8 given shock -Will empirically place on Protonix twice daily for now  #Acute Kidney Injury on CKD Stage IIIa #Hyperkalemia #Hyponatremia -Monitor I&O's / urinary output -Follow BMP -Ensure adequate renal perfusion -Avoid nephrotoxic agents as able -Replace electrolytes as indicated ~ Pharmacy following for assistance with electrolyte replacement -Temporizing measures given (Ca gluconate, insulin) along with fluids ~ repeat BMP later tonight  #Recent C1/C2 fracture with central cord syndrome s/p Laminectomy at [facility], now with worsening dislocation and significant spinal canal stenosis PMHx: small traumatic SAH -ED provider  Dr. Vicente Males spoke with Dr. Cherly Hensen of Neuro ICU at [facility] who discussed with Dr. Despina Pole Neurosurgeon at [facility] ~ NO surgical intervention to be offered per [facility] -Provide supportive care -Consult Neurosurgery here at [facility] for further recommendations  #Diabetes Mellitus Type II #Hypothyroidism -CBG's q4h; Target range of 140 to 180 -SSI -Follow ICU Hypo/Hyperglycemia protocol -Resume home synthroid -Check thyroid panel     Patient is critically ill superimposed on multiple chronic comorbidities and advanced age.  Prognosis is extremely guarded, high risk for further decompensation, cardiac arrest and death.  Patient is DNR/DNI status as confirmed by patient's daughter.    Best Practice (right click and "Reselect all SmartList Selections" daily)   Diet/type: NPO, will need speech evaluation DVT prophylaxis: SCD GI prophylaxis: PPI Lines: N/A Foley:  Yes, and it is still needed Code Status:  DNR/DNI Last date of multidisciplinary goals of care discussion [n/a]  9/27: Updated pt's daughter Marrian Salvage via telephone.  She confirms DNR/DNI status.  Labs   CBC: Recent Labs  Lab 09/14/2022 0847 09/09/2022 2202 10/01/22 0354  WBC 27.5*  --  22.2*  NEUTROABS 22.1*  --   --   HGB 6.3* 7.7* 6.6*  HCT 21.4* 23.6* 21.6*  MCV  99.1  --  94.3  PLT 378  --  334    Basic Metabolic Panel: Recent Labs  Lab 09/15/2022 0847 09/12/2022 2202 10/01/22 0354 10/01/22 0355  NA 129* 132*  --  132*  K 5.8* 4.8  --  4.8  CL 92* 99  --  100  CO2 25 24  --  24  GLUCOSE 174* 144*  --  121*  BUN 62* 53*  --  50*  CREATININE 1.79* 1.39*  --  1.27*  CALCIUM 8.7* 8.2*  --  8.0*  MG  --   --  2.1  --   PHOS  --   --  3.0 3.1   GFR: Estimated Creatinine Clearance: 40.3 mL/min (A) (by C-G formula based on SCr of 1.27 mg/dL (H)). Recent Labs  Lab 09/09/2022 0847 09/24/2022 1022 09/08/2022 2202 10/01/22 0354  PROCALCITON  --   --  0.23  --   WBC 27.5*  --   --  22.2*  LATICACIDVEN  --  1.9 1.5  --      Liver Function Tests: Recent Labs  Lab 09/06/2022 0847 10/01/22 0355  AST 25  --   ALT 14  --   ALKPHOS 67  --   BILITOT 0.6  --   PROT 5.9*  --   ALBUMIN 2.4* 1.9*   No results for input(s): "LIPASE", "AMYLASE" in the last 168 hours. No results for input(s): "AMMONIA" in the last 168 hours.  ABG    Component Value Date/Time   PHART 7.43 09/21/2022 1600   PCO2ART 43 10/03/2022 1600   PO2ART 53 (L) 09/04/2022 1600   HCO3 28.5 (H) 09/04/2022 1600   TCO2 18 (L) 08/24/2022 2030   ACIDBASEDEF 6.0 (H) 12/30/2020 1655   O2SAT 85.8 10/01/2022 1600     Coagulation Profile: Recent Labs  Lab 09/23/2022 1022  INR 1.3*    Cardiac Enzymes: No results for input(s): "CKTOTAL", "CKMB", "CKMBINDEX", "TROPONINI" in the last 168 hours.  HbA1C: Hgb A1c MFr Bld  Date/Time Value Ref Range Status  08/25/2022 12:12 AM 6.0 (H) 4.8 - 5.6 % Final    Comment:    (NOTE) Pre diabetes:          5.7%-6.4%  Diabetes:              >6.4%  Glycemic control for   <7.0% adults with diabetes   04/25/2022 05:49 AM 7.2 (H) 4.8 - 5.6 % Final    Comment:    (NOTE) Pre diabetes:          5.7%-6.4%  Diabetes:              >6.4%  Glycemic control for   <7.0% adults with diabetes     CBG: Recent Labs  Lab 09/22/2022 2022 09/11/2022 2330 10/01/22 0356 10/01/22 0731  GLUCAP 148* 116* 112* 122*    Review of Systems:   Unable to assess due to AMS   Past Medical History:  She,  has a past medical history of Acute massive pulmonary embolism (HCC) (07/13/2012), Anticoagulated on warfarin, Chronic diastolic CHF (congestive heart failure) (HCC) (03/15/2017), Chronic kidney disease, stage 3a (HCC) (12/15/2018), Diabetes mellitus, type 2 (HCC) (07/15/2012), DM2 (diabetes mellitus, type 2) (HCC) (07/23/2012), HFrEF (heart failure with reduced ejection fraction) (HCC) (03/15/2017), Hyperlipemia (07/14/2012), Hyperlipidemia (02/25/2013), Hypertension (07/14/2012), Hypothyroid (06/06/2013), Large bowel stricture  (HCC), Neuropathy (02/12/2015), Osteoarthritis, PAF (paroxysmal atrial fibrillation) (HCC), Pulmonary hypertension (HCC) (07/26/2016), Syncope (10/2018), and Thyroid disease.   Surgical History:   Past Surgical  History:  Procedure Laterality Date   COLONOSCOPY N/A 03/12/2016   Procedure: COLONOSCOPY;  Surgeon: Dorena Cookey, MD;  Location: [facility] ENDOSCOPY;  Service: Endoscopy;  Laterality: N/A;   COLOSTOMY N/A 06/03/2014   Procedure: COLOSTOMY;  Surgeon: Harriette Bouillon, MD;  Location: [facility] [place] OR;  Service: General;  Laterality: N/A;   FLEXIBLE SIGMOIDOSCOPY N/A 05/30/2014   Procedure: Arnell Sieving;  Surgeon: Jeani Hawking, MD;  Location: [facility] ENDOSCOPY;  Service: Endoscopy;  Laterality: N/A;   INSERTION OF VENA CAVA FILTER N/A 07/16/2012   Procedure: INSERTION OF VENA CAVA FILTER;  Surgeon: [name], MD;  Location: [facility] CATH LAB;  Service: Cardiovascular;  Laterality: N/A;   KNEE ARTHROSCOPY     LEFT HEART CATH AND CORONARY ANGIOGRAPHY N/A 01/01/2021   Procedure: LEFT HEART CATH AND CORONARY ANGIOGRAPHY;  Surgeon: Kathleene Hazel, MD;  Location: [facility] INVASIVE CV LAB;  Service: Cardiovascular;  Laterality: N/A;   LEFT HEART CATH AND CORONARY ANGIOGRAPHY N/A 04/29/2022   Procedure: LEFT HEART CATH AND CORONARY ANGIOGRAPHY;  Surgeon: Orbie Pyo, MD;  Location: [facility] INVASIVE CV LAB;  Service: Cardiovascular;  Laterality: N/A;   PARTIAL COLECTOMY N/A 06/03/2014   Procedure: PARTIAL COLECTOMY;  Surgeon: Harriette Bouillon, MD;  Location: [facility] OR;  Service: General;  Laterality: N/A;   REDUCTION MAMMAPLASTY Bilateral    RIGHT HEART CATH N/A 12/30/2020   Procedure: RIGHT HEART CATH;  Surgeon: Kathleene Hazel, MD;  Location: [facility] INVASIVE CV LAB;  Service: Cardiovascular;  Laterality: N/A;   SP ARTHRO HIP*L*       Social History:   reports that she quit smoking about 54 years ago. Her smoking use included cigarettes. She started smoking about 64 years ago. She has a 10 pack-year smoking  history. She has never used smokeless tobacco. She reports that she does not drink alcohol and does not use drugs.   Family History:  Her family history includes Breast cancer in her mother.   Allergies Allergies  Allergen Reactions   Augmentin [Amoxicillin-Pot Clavulanate] Itching and Other (See Comments)    Severe vaginal itching   Tranxene [Clorazepate] Itching   Crestor [Rosuvastatin]     Made her sick on stomach, she is able to tolerate the zocor   Penicillins Itching and Rash     Home Medications  Prior to Admission medications   Medication Sig Start Date End Date Taking? Authorizing Provider  acetaminophen (TYLENOL) 325 MG tablet Take 2 tablets (650 mg total) by mouth every 6 (six) hours as needed for mild pain (or Fever >/= 101). 08/28/22   Simonne Martinet, NP  acetaminophen (TYLENOL) 650 MG suppository Place 1 suppository (650 mg total) rectally every 6 (six) hours as needed for mild pain (or Fever >/= 101). 08/28/22   Simonne Martinet, NP  DULoxetine (CYMBALTA) 30 MG capsule Take 1 capsule (30 mg total) by mouth daily. 08/29/22   Simonne Martinet, NP  HYDROmorphone (DILAUDID) 1 MG/ML injection Inject 0.25 mLs (0.25 mg total) into the vein every 2 (two) hours as needed for severe pain. 08/28/22   Simonne Martinet, NP  levothyroxine (SYNTHROID) 25 MCG tablet Take 25 mcg by mouth daily before breakfast.    [provider]  nitroGLYCERIN (NITROSTAT) 0.4 MG SL tablet Place 1 tablet (0.4 mg total) under the tongue every 5 (five) minutes as needed for chest pain. 08/28/22   Simonne Martinet, NP  norepinephrine (LEVOPHED) 4-5 MG/250ML-% SOLN Inject 0-40 mcg/min into the vein continuous. 08/28/22   Simonne Martinet,  NP  ondansetron (ZOFRAN) 4 MG/2ML SOLN injection Inject 2 mLs (4 mg total) into the vein every 6 (six) hours as needed for nausea or vomiting. 08/28/22   Simonne Martinet, NP  pantoprazole (PROTONIX) 40 MG injection Inject 40 mg into the vein daily. 08/28/22   Simonne Martinet, NP  sodium bicarbonate 650 MG tablet Take 1 tablet (650 mg total) by mouth 3 (three) times daily. 08/28/22   Simonne Martinet, NP     Critical care provider statement:   Total critical care time: 33 minutes   Performed by: Karna Christmas MD   Critical care time was exclusive of separately billable procedures and treating other patients.   Critical care was necessary to treat or prevent imminent or life-threatening deterioration.   Critical care was time spent personally by me on the following activities: development of treatment plan with patient and/or surrogate as well as nursing, discussions with consultants, evaluation of patient's response to treatment, examination of patient, obtaining history from patient or surrogate, ordering and performing treatments and interventions, ordering and review of laboratory studies, ordering and review of radiographic studies, pulse oximetry and re-evaluation of patient's condition.    Vida Rigger, M.D.  Pulmonary & Critical Care Medicine

## 2022-10-01 NOTE — Consult Note (Signed)
Consulting Department:  Inpatient medicine  Primary Physician:  [name], MD  Chief Complaint: Prior cervical spine surgery  History of Present Illness: 10/01/2022 Hannah [name] is a 82 y.o. female who presents with the chief complaint of recent cervical spine surgery at [facility] with Dr. Reola Calkins.  She presented with a cervical spinal cord injury and underwent a laminoplasty with sublaminar wiring.  She was eventually discharged to rehab, she was readmitted for lab abnormalities.  Working up for sepsis.  Admitted to the ICU.  Transfer request was made to [facility] for postoperative care, however they stated that there is no role for surgical intervention and that she was not a candidate for any further surgery.  Therefore they requested that we care for her in the ICU setting here medically only.  Patient states that she has not noticed any worsening in her spinal cord injury, she is previously quite weak in her bilateral upper and lower extremities.  She states that she has had constant neck pain ever since before surgery which is understandable given her previous cervical spinal fracture.  She does state very clearly that she would not be interested in any further surgery even if it would mean maintaining her functional status.  Review of Systems:  A 10 point review of systems is negative, except for the pertinent positives and negatives detailed in the HPI.  Past Medical History: Past Medical History:  Diagnosis Date   Acute massive pulmonary embolism (HCC) 07/13/2012   Massive PE w/ PEA arrest 07/13/12 >TNK >IVC filter >discharged on comadin     Anticoagulated on warfarin    Chronic diastolic CHF (congestive heart failure) (HCC) 03/15/2017   Chronic kidney disease, stage 3a (HCC) 12/15/2018   Diabetes mellitus, type 2 (HCC) 07/15/2012   with peripheral neuropathy   DM2 (diabetes mellitus, type 2) (HCC) 07/23/2012   HFrEF (heart failure with reduced ejection fraction) (HCC)  03/15/2017   Non-ischemic cardiomyopathy // Cardiac catheterization 01/01/21:  RCA prox to mid 20 // Echocardiogram 12/26/20:  EF 30-35, ant septum/apex, mid inf-sept AK; ant-lat/ant/inf HK, Gr 1 DD, mild LVH, normal RVSF, RVSP 27.2, small effusion (ant to RV), mild MR, mod TR, AV sclerosis w/o AS   Hyperlipemia 07/14/2012   Hyperlipidemia 02/25/2013   Hypertension 07/14/2012   Hypothyroid 06/06/2013   Large bowel stricture (HCC)    s/p colectomy in 2016   Neuropathy 02/12/2015   Osteoarthritis    s/p hip and knee replacements   PAF (paroxysmal atrial fibrillation) (HCC)    Pulmonary hypertension (HCC) 07/26/2016   Syncope 10/2018   Thyroid disease     Past Surgical History: Past Surgical History:  Procedure Laterality Date   COLONOSCOPY N/A 03/12/2016   Procedure: COLONOSCOPY;  Surgeon: Dorena Cookey, MD;  Location: [facility] ENDOSCOPY;  Service: Endoscopy;  Laterality: N/A;   COLOSTOMY N/A 06/03/2014   Procedure: COLOSTOMY;  Surgeon: Harriette Bouillon, MD;  Location: [facility] OR;  Service: General;  Laterality: N/A;   FLEXIBLE SIGMOIDOSCOPY N/A 05/30/2014   Procedure: Arnell Sieving;  Surgeon: Jeani Hawking, MD;  Location: [facility] ENDOSCOPY;  Service: Endoscopy;  Laterality: N/A;   INSERTION OF VENA CAVA FILTER N/A 07/16/2012   Procedure: INSERTION OF VENA CAVA FILTER;  Surgeon: [name], MD;  Location: [facility] CATH LAB;  Service: Cardiovascular;  Laterality: N/A;   KNEE ARTHROSCOPY     LEFT HEART CATH AND CORONARY ANGIOGRAPHY N/A 01/01/2021   Procedure: LEFT HEART CATH AND CORONARY ANGIOGRAPHY;  Surgeon: Kathleene Hazel, MD;  Location:  [facility] INVASIVE CV LAB;  Service: Cardiovascular;  Laterality: N/A;   LEFT HEART CATH AND CORONARY ANGIOGRAPHY N/A 04/29/2022   Procedure: LEFT HEART CATH AND CORONARY ANGIOGRAPHY;  Surgeon: Orbie Pyo, MD;  Location: [facility] INVASIVE CV LAB;  Service: Cardiovascular;  Laterality: N/A;   PARTIAL COLECTOMY N/A 06/03/2014   Procedure: PARTIAL COLECTOMY;  Surgeon: Harriette Bouillon, MD;  Location: [facility] OR;  Service: General;  Laterality: N/A;   REDUCTION MAMMAPLASTY Bilateral    RIGHT HEART CATH N/A 12/30/2020   Procedure: RIGHT HEART CATH;  Surgeon: Kathleene Hazel, MD;  Location: [facility] INVASIVE CV LAB;  Service: Cardiovascular;  Laterality: N/A;   SP ARTHRO HIP*L*      Allergies: Allergies as of 09/17/2022 - Review Complete 09/26/2022  Allergen Reaction Noted   Augmentin [amoxicillin-pot clavulanate] Itching and Other (See Comments) 08/05/2012   Tranxene [clorazepate] Itching 09/06/2013   Crestor [rosuvastatin]  08/19/2020   Penicillins Itching and Rash     Medications:  Current Facility-Administered Medications:    0.9 %  sodium chloride infusion, 10 mL/hr, Intravenous, Once, Bradler, Clent Jacks, MD   0.9 %  sodium chloride infusion, , Intravenous, Once, Harlon Ditty D, NP   0.9 %  sodium chloride infusion, 250 mL, Intravenous, Continuous, Karna Christmas, Fuad, MD   Chlorhexidine Gluconate Cloth 2 % PADS 6 each, 6 each, Topical, Daily, Ouma, Hubbard Hartshorn, NP, 6 each at 09/16/2022 2148   docusate sodium (COLACE) capsule 100 mg, 100 mg, Oral, BID PRN, Harlon Ditty D, NP   fentaNYL (SUBLIMAZE) injection 50 mcg, 50 mcg, Intravenous, Q2H PRN, Jimmye Norman, NP, 50 mcg at 10/01/22 6295   insulin aspart (novoLOG) injection 0-15 Units, 0-15 Units, Subcutaneous, Q4H, Harlon Ditty D, NP, 2 Units at 10/01/22 0805   levothyroxine (SYNTHROID) tablet 25 mcg, 25 mcg, Oral, Q0600, Harlon Ditty D, NP   norepinephrine (LEVOPHED) 4mg  in (0.016 mg/mL) premix infusion, 2-10 mcg/min, Intravenous, Titrated, Aleskerov, Fuad, MD, Stopped at 10/01/22 0222   pantoprazole (PROTONIX) injection 40 mg, 40 mg, Intravenous, Q24H, Aleskerov, Fuad, MD   polyethylene glycol (MIRALAX / GLYCOLAX) packet 17 g, 17 g, Oral, Daily PRN, Harlon Ditty D, NP   vancomycin (VANCOREADY) IVPB 750 mg/150 mL, 750 mg, Intravenous, Once, Hunt, Madison H, RPH   Social  History: Social History   Tobacco Use   Smoking status: Former    Current packs/day: 0.00    Average packs/day: 1 pack/day for 10.0 years (10.0 ttl pk-yrs)    Types: Cigarettes    Start date: 01/03/1958    Quit date: 01/04/1968    Years since quitting: 54.7   Smokeless tobacco: Never  Vaping Use   Vaping status: Never Used  Substance Use Topics   Alcohol use: No   Drug use: No    Family Medical History: Family History  Problem Relation Age of Onset   Breast cancer Mother     Physical Examination: Vitals:   10/01/22 0800 10/01/22 0900  BP: 109/84 (!) 111/57  Pulse: (!) 101   Resp: 19 20  Temp: 98.8 F (37.1 C) 98.6 F (37 C)  SpO2: 94%      General: Patient appears to be quite ill, has rotation to her head, currently in a c-collar.  NEUROLOGICAL:  General: In no acute distress.    ROM of spine: \Not tested given instability noted on cross-sectional imaging.  OSH Discharge Exam Neuro exam (postop) Mental status: Awake, alert Cranial Nerves: Face symmetric, some swelling on forehead Speech: hypophonic Motor:  UE  SA EF EE Gr FAb  R 2/5 4/5 4-/5 2/5 2/5  L 2/5 4/5 4-/5 1/5 1/5  LE HF KE KF DF PF  R 1/5 1/5 3/5 1/5 4/5  L 2/5 2/5 4/5 1/5 4/5  Sensory: unreliable sensory exam, can feel light tough in all four extremities, states legs "feel different" Wound: staples   Current Exam:  Shows significant weakness in all 4 extremities.  She does have some flicker like movement in her hands and grip bilaterally and toe wiggle bilaterally.  She has difficulty with confrontational examination due to level of awareness, but was able to flex her elbows antigravity.  Imaging: Narrative & Impression  CLINICAL DATA:  Mental status change, unknown cause; Neck trauma (Age >= 65y).   EXAM: CT HEAD WITHOUT CONTRAST   CT CERVICAL SPINE WITHOUT CONTRAST   TECHNIQUE: Multidetector CT imaging of the head and cervical spine was performed following the standard protocol  without intravenous contrast. Multiplanar CT image reconstructions of the cervical spine were also generated.   RADIATION DOSE REDUCTION: This exam was performed according to the departmental dose-optimization program which includes automated exposure control, adjustment of the mA and/or kV according to patient size and/or use of iterative reconstruction technique.   COMPARISON:  CT head and cervical spine 08/25/2022. MRI head 07/24/2022. MRI cervical spine 08/25/2022.   FINDINGS: CT HEAD FINDINGS   Brain: There is no evidence of an acute infarct, intracranial hemorrhage, mass, midline shift, or extra-axial fluid collection. Advanced cerebral atrophy and ventriculomegaly are unchanged. Patchy hypodensities in the cerebral white matter are unchanged and nonspecific but compatible with moderate chronic small vessel ischemic disease.   Vascular: Calcified atherosclerosis at the skull base. No hyperdense vessel.   Skull: No acute fracture or suspicious osseous lesion.   Sinuses/Orbits: No evidence of acute inflammation in the included portions of the paranasal sinuses. New bilateral mastoid and middle ear effusions, right larger than left. Unremarkable orbits.   Other: Small right frontal scalp hematoma, decreased in size from 08/25/2022.   CT CERVICAL SPINE FINDINGS   Alignment: Straightening of the normal lordosis in the mid to lower cervical spine. Left convex cervical spine curvature.   Skull base and vertebrae: Known recent fracture of the base of the dens with increased posterior angulation of the dens relative to the C2 vertebral body compared to the prior CT. Known bilateral anterior C1 arch fractures with increased displacement compared to the prior CT. Subluxation of C1 relative to C2 on the prior CT has progressed, and the C1-2 articulations are now completely dislocated with the lateral masses of C1 displaced posterior to C2. Postsurgical changes are noted, with  a new spacer located between the posterior ring of C1 and C2 spinous process and with laminectomies having been performed from C3-C6 with plate and screws in place.   Soft tissues and spinal canal: No significant prevertebral fluid. Postoperative changes in the posterior neck soft tissues including fluid collections from C1-C6, incompletely evaluated in the absence of IV contrast.   Disc levels: Severe spinal stenosis at C1-2 related to the fractures and posterior subluxation of C1 relative to C2 as well as ligamentous thickening and calcification posterior to the dens. 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   Upper chest: No mass or consolidation in the included lung apices.   Other: None.   IMPRESSION: 1. Known recent C1 and C2 fractures with increased fracture displacement and angulation including new complete posterior dislocation of the lateral masses  of C1 relative to C2. Resultant severe spinal stenosis at C1-2. 2. No evidence of acute intracranial abnormality. 3. Moderate chronic small vessel ischemic disease and advanced cerebral atrophy. 4. New bilateral mastoid and middle ear effusions.     Electronically Signed   By: Sebastian Ache M.D.   On: 09/24/2022 11:26    I have personally reviewed the images and agree with the above interpretation.  Labs:    Latest Ref Rng & Units 10/01/2022    3:54 AM 09/22/2022   10:02 PM 09/14/2022    8:47 AM  CBC  WBC 4.0 - 10.5 K/uL 22.2   27.5   Hemoglobin 12.0 - 15.0 g/dL 6.6  7.7  6.3   Hematocrit 36.0 - 46.0 % 21.6  23.6  21.4   Platelets 150 - 400 K/uL 334   378        Assessment and Plan: Ms. Oconnor is a pleasant 82 y.o. female with history of a recent cervical spine decompression and wire stabilization at [facility] approximately 1 month ago.  She was discharged and at rehab when she was found to have some lab abnormalities and sent in for  evaluation.  From a cervical spine evaluation, she had a repeat CT which demonstrated some further dislocation displacement of her facet joints.  This finding was reviewed with her primary surgeon at [facility] who stated that there was no indication for further surgery and that she should be managed medically for her current alterations.  On follow-up she appears to be mostly stable as far as neurologic standpoint goes.  I was able to find her last outside physical examination which is listed above, showed severe weakness in all 4 extremities.  Both her outside surgeon as well as the patient herself stated that they would not want any further surgery even if it meant the possibility of having a worsening major neurologic deficit.  At this point no current role for surgery.  Should she have an acute worsening we can reach back out to her primary surgeon at [facility], but from discussion with her and her surgeon sounds as if they would not move forward with any intervention.   Lovenia Kim, MD/MSCR Dept. of Neurosurgery

## 2022-10-01 NOTE — Progress Notes (Signed)
Dr. Karna Christmas made aware of current hgb 6.6. per md will place order to check blood levels and CT of abdomen. Hold off on turning patient until chart reviewed regarding neck injury per MD

## 2022-10-01 NOTE — Progress Notes (Signed)
Per neurosurgeon, okay to turn patient.

## 2022-10-02 DIAGNOSIS — A419 Sepsis, unspecified organism: Secondary | ICD-10-CM

## 2022-10-02 DIAGNOSIS — Z515 Encounter for palliative care: Secondary | ICD-10-CM | POA: Diagnosis not present

## 2022-10-02 DIAGNOSIS — S13101A Dislocation of unspecified cervical vertebrae, initial encounter: Secondary | ICD-10-CM | POA: Diagnosis not present

## 2022-10-02 DIAGNOSIS — R579 Shock, unspecified: Secondary | ICD-10-CM | POA: Diagnosis not present

## 2022-10-02 LAB — TYPE AND SCREEN
ABO/RH(D): O POS
Antibody Screen: NEGATIVE
Unit division: 0
Unit division: 0
Unit division: 0
Unit division: 0
Unit division: 0

## 2022-10-02 LAB — BPAM RBC
Blood Product Expiration Date: 202410282359
Blood Product Expiration Date: 202410282359
Blood Product Unit Number: 202410282359
Blood Product Unit Number: 202410282359
Blood Product Unit Number: 202410282359
ISSUE DATE / TIME: 202409271000
ISSUE DATE / TIME: 202409281201
Unit Type and Rh: 202410282359
Unit Type and Rh: 202410282359
Unit Type and Rh: 202410282359
Unit Type and Rh: 202410282359
Unit Type and Rh: 5100
Unit Type and Rh: 5100
Unit Type and Rh: 5100
Unit Type and Rh: 5100
Unit Type and Rh: 5100

## 2022-10-02 LAB — GLUCOSE, CAPILLARY
Glucose-Capillary: 71 mg/dL (ref 70–99)
Glucose-Capillary: 63 mg/dL — ABNORMAL LOW (ref 70–99)
Glucose-Capillary: 64 mg/dL — ABNORMAL LOW (ref 70–99)
Glucose-Capillary: 81 mg/dL (ref 70–99)
Glucose-Capillary: 99 mg/dL (ref 70–99)

## 2022-10-02 LAB — PREPARE RBC (CROSSMATCH)

## 2022-10-02 MED ORDER — GLYCOPYRROLATE 1 MG PO TABS: 1.0000 mg | ORAL_TABLET | ORAL | Status: DC | PRN

## 2022-10-02 MED ORDER — DEXTROSE 10 % IV SOLN: INTRAVENOUS | Status: DC

## 2022-10-02 MED ORDER — DEXTROSE 50 % IV SOLN
12.5000 g | INTRAVENOUS | Status: AC
  Administered 2022-10-02: 12.5 g via INTRAVENOUS

## 2022-10-02 MED ORDER — LORAZEPAM 1 MG PO TABS: 1.0000 mg | ORAL_TABLET | ORAL | Status: DC | PRN

## 2022-10-02 MED ORDER — ONDANSETRON 4 MG PO TBDP: 4.0000 mg | ORAL_TABLET | Freq: Four times a day (QID) | ORAL | Status: DC | PRN

## 2022-10-02 MED ORDER — BIOTENE DRY MOUTH MT LIQD: 15.0000 mL | OROMUCOSAL | Status: DC | PRN

## 2022-10-02 MED ORDER — HALOPERIDOL LACTATE 5 MG/ML IJ SOLN: 0.5000 mg | INTRAMUSCULAR | Status: DC | PRN

## 2022-10-02 MED ORDER — POLYVINYL ALCOHOL 1.4 % OP SOLN: 1.0000 [drp] | Freq: Four times a day (QID) | OPHTHALMIC | Status: DC | PRN

## 2022-10-02 MED ORDER — LORAZEPAM 2 MG/ML IJ SOLN: 1.0000 mg | INTRAMUSCULAR | Status: DC | PRN

## 2022-10-02 MED ORDER — ACETAMINOPHEN 650 MG RE SUPP: 650.0000 mg | Freq: Four times a day (QID) | RECTAL | Status: DC | PRN

## 2022-10-02 MED ORDER — HALOPERIDOL 0.5 MG PO TABS: 0.5000 mg | ORAL_TABLET | ORAL | Status: DC | PRN

## 2022-10-02 MED ORDER — ACETAMINOPHEN 325 MG PO TABS
650.0000 mg | ORAL_TABLET | Freq: Four times a day (QID) | ORAL | Status: DC | PRN
  Administered 2022-10-12 – 2022-10-13 (×2): 650 mg via ORAL
  Filled 2022-10-02 (×2): qty 2

## 2022-10-02 MED ORDER — ONDANSETRON HCL 4 MG/2ML IJ SOLN
4.0000 mg | Freq: Four times a day (QID) | INTRAMUSCULAR | Status: DC | PRN
  Administered 2022-10-15: 4 mg via INTRAVENOUS
  Filled 2022-10-02: qty 2

## 2022-10-02 MED ORDER — GLYCOPYRROLATE 0.2 MG/ML IJ SOLN: 0.2000 mg | INTRAMUSCULAR | Status: DC | PRN

## 2022-10-02 MED ORDER — DEXTROSE 50 % IV SOLN
INTRAVENOUS | Status: AC
  Filled 2022-10-02: qty 50

## 2022-10-02 MED ORDER — LORAZEPAM 2 MG/ML PO CONC
1.0000 mg | ORAL | Status: DC | PRN
  Administered 2022-10-03: 1 mg via SUBLINGUAL
  Filled 2022-10-02: qty 1

## 2022-10-02 MED ORDER — HALOPERIDOL LACTATE 2 MG/ML PO CONC: 0.5000 mg | ORAL | Status: DC | PRN

## 2022-10-02 NOTE — Progress Notes (Signed)
PHARMACY CONSULT NOTE   Pharmacy Consult for Electrolyte Monitoring and Replacement   Recent Labs: Potassium (mmol/L)  Date Value  10/01/2022 4.8   Magnesium (mg/dL)  Date Value  40/98/1191 2.1   Calcium (mg/dL)  Date Value  47/82/9562 8.0 (L)   Albumin (g/dL)  Date Value  13/08/6576 1.9 (L)   Phosphorus (mg/dL)  Date Value  46/96/2952 3.1   Sodium (mmol/L)  Date Value  10/01/2022 132 (L)  06/29/2022 142    Assessment: 82 year old female with a past medical history significant for recent C1/C2 fracture and central cord syndrome approximately 1 month ago following a fall at home status post laminectomy at [facility], PE, HFpEF, CKD Stage IIIa, DM Type 2, HTN, paroxsymal A.fib, and Hypothyroidism who presents to [facility] ED on 09/08/2022 due to reported hemoglobin of 6.8 at [facility] healthcare facility.  Patient is currently altered and unable to contribute to history, therefore history obtained from chart review along with report from patient's daughter via telephone.   Goal of Therapy:  Electrolytes WNL  Plan:  No new labs. No replacement needed. F/u with AM labs.   Ronnald Ramp ,PharmD Clinical Pharmacist 10/02/2022 10:17 AM

## 2022-10-02 NOTE — Progress Notes (Signed)
Daily Progress Note   Patient Name: Hannah [name]       Date: 10/02/2022 DOB: 05-08-1940  Age: 82 y.o. MRN#: [id number] Attending Physician: Vida Rigger, MD Primary Care Physician: [name], MD Admit Date: 27-Oct-2022  Reason for Consultation/Follow-up: Establishing goals of care  HPI/Brief Hospital Review:  82 y.o. female  with past medical history of PE, HFpEF, CKD stage 3a, T2DM, HTN, paroxsymal atrial fibrillation and hypothyroidism admitted from [facility] on 27-Oct-2022 with anemia found on routine blood draw (hemoglobin 6.8).   Noted recent month long stay at [facility] s/p laminectomy for C1/C2 fracture with associated central cord syndrome and small subarachnoid hemorrhage after suffering a fall at home.   CT cervical spine 1. Known recent C1 and C2 fractures with increased fracture displacement and angulation including new complete posterior dislocation of the lateral masses of C1 relative to C2. Resultant severe spinal stenosis at C1-2.   CT abdomen/pelvis 1. Enlargement and heterogeneity of the right iliopsoas muscles with associated fat stranding is suspicious for an intramuscular hematoma. 2. Small to moderate right pleural effusion with associated atelectasis. 3. Anasarca.   Palliative medicine was consulted for assisting with goals of care conversations.   Subjective: Extensive chart review has been completed prior to meeting patient including labs, vital signs, imaging, progress notes, orders, and available advanced directive documents from current and previous encounters.    Rounded this AM at Ms. Mckeithan's bedside, awake, able to answer simple questions with "yes or no" but remains confused and unable to answer all orientation  questions.  Notified by nursing staff family had arrived. Visited with family outside of room per their request.  Met with Michelle-daughter, Salvadore Farber, Laurence Aly his wife and children. Sharlet Salina able to share his understanding of current medical situation. We reviewed the imaging results of cervical neck fractures as well as imaging results for hematoma development contributing to blood loss anemia. All family aware of the limited interventions available due to high risk related to neck fractures and cord compression as well as ongoing bleeding. Family during conversations 9/28 made decision to not escalate care.  Sharlet Salina requesting to speak with provider staff that had communication with [facility] team-requested Dr. Karna Christmas to join meeting. Dr. Karna Christmas able to share images performed on arrival to [facility] were sent to [facility] team and after  their review it was determined they could not offer further surgical intervention at that time.  Sharlet Salina expressed his ongoing concern related to potential events that could have contributed to worsening cervical fractures as well as hematoma development. Notified TOC-SW, Meagan Hagwood, APS case initiated.  As started yesterday, continued conversations in relation to transitioning to comfort care in regards to inoperable cervical neck fractures and related bleeding complications. Ms. Simison would no longer receive aggressive medical interventions such as continuous vital signs, lab work, radiology testing, IV fluids or medications not focused on comfort. All care would focus on how the patient is looking and feeling. This would include management of any symptoms that may cause discomfort, pain, shortness of breath, cough, nausea, agitation, anxiety, and/or secretions etc. Symptoms would be managed with medications and other non-pharmacological interventions such as spiritual support if requested, repositioning, music therapy, or therapeutic listening. Family  verbalized understanding and appreciation. Ms. Viviani has PEG tube in place from initial injury placed by [facility], informed family tube feedings typically not initiated during comfort care, we did discuss allowing for comfort feeds with their understanding Ms. Macewan is at high risk for aspiration and to follow strict aspiration precautions and to provide soft foods as requested by Ms. Rieves. We discussed utilizing medications such as continuing IV fentanyl PRN, ativan, haldol and zofran as needed to maintain comfort. Family voices their understanding and confirms there wish to transition to full comfort measures with an understanding Ms. Neiswonger's time is likely limited.  Rounded multiple times throughout shift, Ms. Demann appears to remain comfortable. Answered and addressed all questions and concerns from family.  PMT will continue to follow for ongoing needs and support.   Care plan was discussed with ICU team, nursing staff and TOC.  Thank you for allowing the Palliative Medicine Team to assist in the care of this patient.  Total time:  50 minutes  Time spent includes: Detailed review of medical records (labs, imaging, vital signs), medically appropriate exam (mental status, respiratory, cardiac, skin), discussed with treatment team, counseling and educating patient, family and staff, documenting clinical information, medication management and coordination of care.  Leeanne Deed, DNP, AGNP-C Palliative Medicine   Please contact Palliative Medicine Team phone at [phone] for questions and concerns.
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
## 2022-10-02 NOTE — Progress Notes (Signed)
NAME:  Hannah [name], MRN:  [id number], DOB:  1940/06/13, LOS: 2 ADMISSION DATE:  10/11/22, CONSULTATION DATE:  10/11/2022 REFERRING MD:  Dr. Marisa Severin, CHIEF COMPLAINT:  Acute Anemia   Brief Pt Description / Synopsis:  82 y.o. female, DNR/DNI, with recent C1/C2 fracture and central cord syndrome approximately 1 month ago following a fall at home status post laminectomy at [facility], now admitted with Multifactorial Shock (Hemorrhagic +septic), Severe Sepsis of unknown etiology, Acute Blood Loss Anemia, and worsening dislocation of the lateral massess of C1 and C2 with significant spinal stenosis.  History of Present Illness:  Hannah [name] is a 82 year old female with a past medical history significant for recent C1/C2 fracture and central cord syndrome approximately 1 month ago following a fall at home status post laminectomy at [facility], PE, HFpEF, CKD Stage IIIa, DM Type 2, HTN, paroxsymal A.fib, and Hypothyroidism who presents to [facility] ED on 10-11-2022 due to reported hemoglobin of 6.8 at [facility] healthcare facility.  Patient is currently altered and unable to contribute to history, therefore history obtained from chart review along with report from patient's daughter via telephone.  Per chart review she was discharged to [facility] healthcare 3 days ago following a month-long stay at [facility] for laminectomy.  She was sent to the ED on 9/25 due to bleeding in the right bicep area where PICC line was previously located (was removed prior to discharge at [facility]  Hemostatic gauze and bandage were placed and she was discharged back to [facility] healthcare.  Of note, details of recent fracture are as follows: She had a fall on 8/21 with associated neck pain and bilateral weakness. She was admitted at [facility] where CT showed C1-C1 fracture and a small subarachnoid hemorrhage. MRI obtained showed C1 and C2 fracture, ossification of posterior longitudinal ligament, cervical spondylosis, cervical spinal cord injury,  central cord syndrome.  Initial plan by neuro surgery at [facility] was for posterior cervical decompression, instrumentation and fusion from C1-C6 to be done on 8/28, but it was felt she would be best served by medically stabilizing and correcting coagulopathy prior surgery.  However on 8/25 family requested she be transferred to [facility] for Neurosurgical intervention there.   ED Course: Initial Vital Signs: Temperature 98.6 F orally, pulse 97, respiratory 15, blood pressure 90/45, SpO2 92% Significant Labs: Sodium 129, potassium 5.8, glucose 174, BUN 62, creatinine 1.79, at bedtime troponin 10, lactic acid 1.9, WBC 27.5 with mild left shift, hemoglobin 6.3, INR 1.3, PT 16 ABG: pH 7.43/pCO2 43/pO2 53/bicarb 20.5 COVID-19 PCR is negative Imaging Chest X-ray>>1. Low lung volumes, otherwise no acute cardiopulmonary abnormality. Aortic Atherosclerosis (ICD10-I70.0). 2. Possible oral contrast mixed with stool in visible bowel. CT Head w/o contrast>>IMPRESSION: No evidence of acute intracranial abnormality.  Moderate chronic small vessel ischemic disease and advanced cerebral atrophy. New bilateral mastoid and middle ear effusions. CT Cervical Spine>>IMPRESSION: 1. Known recent C1 and C2 fractures with increased fracture displacement and angulation including new complete posterior dislocation of the lateral masses of C1 relative to C2. Resultant severe spinal stenosis at C1-2. Venous US RUE>>IMPRESSION: No evidence of DVT within the right upper extremity. Medications Administered: 3L NS boluses, Ceftriaxone and Vancomycin, 1g Calcium gluconate, 1 unit pRBCs  She met Sepsis criteria therefore cultures were drawn and she recevied broad spectrum ABX.  Given concern for dislocation of the lateral massess of C1 and C2 with significant spinal stenosis, ED provider Dr. Vicente Males spoke with Dr. Cherly Hensen of Neuro ICU at [facility] who discussed with Dr. Despina Pole Neurosurgeon  at [facility], NO surgical intervention to be  offered per [facility] thus pt not needing transfer.  Despite IV fluid resuscitation and 1 unit of blood she remained hypotensive requiring initiation of peripheral Levophed.  PCCM is asked to admit for further workup and treatment.    Please see "Significant Hospital Events" section below for full detailed hospital course.   10/01/22- patient with persistent worsening anemia post transfusion.  Will obtain mixing inhibitor study and haptoglobin, CT abd/pelvis to rule out additional sources.  Palliative care consultation today due to poor prognosis with unstable cervical neck fracture DNR patient with severe comorbid conditions. CT abd with iliopsoas hematoma likely culprit of acute blood loss.  Coagulopathy workup initiated.   10/02/22-patient is for comfort care today due to overall very poor prognosis.  I met with family today we reviewed her complex remote hospitalization and current medical findings/plan.  They did ask about possible trauma that patient may have sustained to cause her issues but that is very difficult to ascertain since she had been sick for many years and on blood thinners which are known to cause bleeding and she already had severe cervical  neck fractures which were evaluated and treated by NSGY but deemed further non operable fractures post hospitalization at [facility].  It is difficult to speculate weather her overall comorbid status has contributed to her condition or if she had something else occur at some point in the past.  Pertinent  Medical History   Past Medical History:  Diagnosis Date   Acute massive pulmonary embolism (HCC) 07/13/2012   Massive PE w/ PEA arrest 07/13/12 >TNK >IVC filter >discharged on comadin     Anticoagulated on warfarin    Chronic diastolic CHF (congestive heart failure) (HCC) 03/15/2017   Chronic kidney disease, stage 3a (HCC) 12/15/2018   Diabetes mellitus, type 2 (HCC) 07/15/2012   with peripheral neuropathy   DM2 (diabetes mellitus, type 2) (HCC)  07/23/2012   HFrEF (heart failure with reduced ejection fraction) (HCC) 03/15/2017   Non-ischemic cardiomyopathy // Cardiac catheterization 01/01/21:  RCA prox to mid 20 // Echocardiogram 12/26/20:  EF 30-35, ant septum/apex, mid inf-sept AK; ant-lat/ant/inf HK, Gr 1 DD, mild LVH, normal RVSF, RVSP 27.2, small effusion (ant to RV), mild MR, mod TR, AV sclerosis w/o AS   Hyperlipemia 07/14/2012   Hyperlipidemia 02/25/2013   Hypertension 07/14/2012   Hypothyroid 06/06/2013   Large bowel stricture (HCC)    s/p colectomy in 2016   Neuropathy 02/12/2015   Osteoarthritis    s/p hip and knee replacements   PAF (paroxysmal atrial fibrillation) (HCC)    Pulmonary hypertension (HCC) 07/26/2016   Syncope 10/2018   Thyroid disease     Micro Data:  2022/10/04: SARS-CoV-2/flu/RSV PCR>> negative 10/04/2022: Blood culture x 2>>  Antimicrobials:   Anti-infectives (From admission, onward)    Start     Dose/Rate Route Frequency Ordered Stop   10/01/22 1500  vancomycin (VANCOREADY) IVPB 750 mg/150 mL  Status:  Discontinued        750 mg 150 mL/hr over 60 Minutes Intravenous  Once 10/04/22 1809 10/01/22 1028   10/01/22 1200  cefTRIAXone (ROCEPHIN) 2 g in sodium chloride 0.9 % 100 mL IVPB        2 g 200 mL/hr over 30 Minutes Intravenous Daily 10/01/22 1028     October 04, 2022 1830  ceFEPIme (MAXIPIME) 2 g in sodium chloride 0.9 % 100 mL IVPB  Status:  Discontinued        2 g 200 mL/hr over  30 Minutes Intravenous Every 24 hours 09/27/2022 1805 10/01/22 0845   09/06/2022 1315  vancomycin (VANCOREADY) IVPB 2000 mg/400 mL        2,000 mg 200 mL/hr over 120 Minutes Intravenous  Once 09/20/2022 1312 09/06/2022 1750   09/06/2022 0945  cefTRIAXone (ROCEPHIN) 2 g in sodium chloride 0.9 % 100 mL IVPB  Status:  Discontinued        2 g 200 mL/hr over 30 Minutes Intravenous Every 24 hours 09/24/2022 0935 09/12/2022 1744        Significant Hospital Events: Including procedures, antibiotic start and stop dates in addition to other pertinent events    9/27: Presented to ED for reports of Hgb 6.8 at facility.  Found to be hypotensive.  CT Cervical Spine with dislocation of the lateral massess of C1 and C2 with significant spinal strenosis.  [facility] states NO surgical intervention to be offered, thus pt not needing transfer.  Remained hypotensive requiring initiation of peripheral Levophed.  PCCM asked to admit.    Objective   Blood pressure 120/64, pulse (!) 103, temperature (!) 97 F (36.1 C), resp. rate 14, weight 91.2 kg, SpO2 98%.        Intake/Output Summary (Last 24 hours) at 10/02/2022 0948 Last data filed at 10/02/2022 0500 Gross per 24 hour  Intake 855.08 ml  Output 975 ml  Net -119.92 ml   Filed Weights   09/20/2022 0839 09/07/2022 2030 10/01/22 9629  Weight: 90.9 kg 86.6 kg 91.2 kg    Examination: General: Acute on chronically ill-appearing female, laying in bed, on 2 L nasal cannula, no acute distress HENT: Atraumatic, normocephalic Lungs: Clear breath sounds throughout, even, nonlabored Cardiovascular: Tachycardia, regular rhythm, S1-S2, no murmurs, rubs, gallops Abdomen: Obese, soft, nontender, nondistended, no guarding rebound tenderness, bowel sounds positive x 4 Extremities: Right upper extremity swollen, generalized weakness throughout Neuro: Awake and alert, oriented only to self and place, pupils PERRLA GU: Foley catheter in place draining yellow urine  Resolved Hospital Problem list     Assessment & Plan:   #Shock: Hypovolemic/Hemorrhagic + Septic +/- Autonomic dysfunction in setting of spine fracture PMHx: HFpEF, Paroxsymal A.fib, HTN, HLD, PE -Continuous cardiac monitoring -Maintain MAP >65 -IV fluids -Transfusions as indicated -Vasopressors as needed to maintain MAP goal -Trend lactic acid until normalized -Trend HS Troponin until peaked  #Meets SIRS Criteria upon Presentation (HR >90, WBC 27) #Severe Sepsis due to unknown source currently -Monitor fever curve -Trend WBC's &  Procalcitonin -Follow cultures as above -Continue empiric Cefepime and Vancomycin pending cultures & sensitivities  #Acute Blood Loss Anemia, reported she was bleeding from previous PICC site -Monitor for S/Sx of bleeding -Trend CBC -SCD's for VTE Prophylaxis (avoid chemical ppx for now) -Transfuse for Hgb <8 given shock -Will empirically place on Protonix twice daily for now  #Acute Kidney Injury on CKD Stage IIIa #Hyperkalemia #Hyponatremia -Monitor I&O's / urinary output -Follow BMP -Ensure adequate renal perfusion -Avoid nephrotoxic agents as able -Replace electrolytes as indicated ~ Pharmacy following for assistance with electrolyte replacement -Temporizing measures given (Ca gluconate, insulin) along with fluids ~ repeat BMP later tonight  #Recent C1/C2 fracture with central cord syndrome s/p Laminectomy at [facility], now with worsening dislocation and significant spinal canal stenosis PMHx: small traumatic SAH -ED provider Dr. Vicente Males spoke with Dr. Cherly Hensen of Neuro ICU at [facility] who discussed with Dr. Despina Pole Neurosurgeon at [facility] ~ NO surgical intervention to be offered per [facility] -Provide supportive care -Consult Neurosurgery here at [facility] for further recommendations  #  Diabetes Mellitus Type II #Hypothyroidism -CBG's q4h; Target range of 140 to 180 -SSI -Follow ICU Hypo/Hyperglycemia protocol -Resume home synthroid -Check thyroid panel     Patient is critically ill superimposed on multiple chronic comorbidities and advanced age.  Prognosis is extremely guarded, high risk for further decompensation, cardiac arrest and death.  Patient is DNR/DNI status as confirmed by patient's daughter.    Best Practice (right click and "Reselect all SmartList Selections" daily)   Diet/type: NPO, will need speech evaluation DVT prophylaxis: SCD GI prophylaxis: PPI Lines: N/A Foley:  Yes, and it is still needed Code Status:  DNR/DNI Last date of multidisciplinary goals of care discussion  [n/a]  9/27: Updated pt's daughter Marrian Salvage via telephone.  She confirms DNR/DNI status.  Labs   CBC: Recent Labs  Lab 09/05/2022 0847 09/20/2022 2202 10/01/22 0354  WBC 27.5*  --  22.2*  NEUTROABS 22.1*  --   --   HGB 6.3* 7.7* 6.6*  HCT 21.4* 23.6* 21.6*  MCV 99.1  --  94.3  PLT 378  --  334    Basic Metabolic Panel: Recent Labs  Lab 09/25/2022 0847 09/06/2022 2202 10/01/22 0354 10/01/22 0355  NA 129* 132*  --  132*  K 5.8* 4.8  --  4.8  CL 92* 99  --  100  CO2 25 24  --  24  GLUCOSE 174* 144*  --  121*  BUN 62* 53*  --  50*  CREATININE 1.79* 1.39*  --  1.27*  CALCIUM 8.7* 8.2*  --  8.0*  MG  --   --  2.1  --   PHOS  --   --  3.0 3.1   GFR: Estimated Creatinine Clearance: 40.3 mL/min (A) (by C-G formula based on SCr of 1.27 mg/dL (H)). Recent Labs  Lab 09/23/2022 0847 09/11/2022 1022 10/01/2022 2202 10/01/22 0354  PROCALCITON  --   --  0.23  --   WBC 27.5*  --   --  22.2*  LATICACIDVEN  --  1.9 1.5  --     Liver Function Tests: Recent Labs  Lab 09/14/2022 0847 10/01/22 0355  AST 25  --   ALT 14  --   ALKPHOS 67  --   BILITOT 0.6  --   PROT 5.9*  --   ALBUMIN 2.4* 1.9*   No results for input(s): "LIPASE", "AMYLASE" in the last 168 hours. No results for input(s): "AMMONIA" in the last 168 hours.  ABG    Component Value Date/Time   PHART 7.43 09/24/2022 1600   PCO2ART 43 09/18/2022 1600   PO2ART 53 (L) 09/06/2022 1600   HCO3 28.5 (H) 09/10/2022 1600   TCO2 18 (L) 08/24/2022 2030   ACIDBASEDEF 6.0 (H) 12/30/2020 1655   O2SAT 85.8 09/26/2022 1600     Coagulation Profile: Recent Labs  Lab 09/14/2022 1022  INR 1.3*    Cardiac Enzymes: No results for input(s): "CKTOTAL", "CKMB", "CKMBINDEX", "TROPONINI" in the last 168 hours.  HbA1C: Hgb A1c MFr Bld  Date/Time Value Ref Range Status  08/25/2022 12:12 AM 6.0 (H) 4.8 - 5.6 % Final    Comment:    (NOTE) Pre diabetes:          5.7%-6.4%  Diabetes:              >6.4%  Glycemic control for    <7.0% adults with diabetes   04/25/2022 05:49 AM 7.2 (H) 4.8 - 5.6 % Final    Comment:    (NOTE) Pre  diabetes:          5.7%-6.4%  Diabetes:              >6.4%  Glycemic control for   <7.0% adults with diabetes     CBG: Recent Labs  Lab 10/01/22 2329 10/02/22 0326 10/02/22 0734 10/02/22 0808 10/02/22 0811  GLUCAP 92 71 64* 63* 81    Review of Systems:   Unable to assess due to AMS   Past Medical History:  She,  has a past medical history of Acute massive pulmonary embolism (HCC) (07/13/2012), Anticoagulated on warfarin, Chronic diastolic CHF (congestive heart failure) (HCC) (03/15/2017), Chronic kidney disease, stage 3a (HCC) (12/15/2018), Diabetes mellitus, type 2 (HCC) (07/15/2012), DM2 (diabetes mellitus, type 2) (HCC) (07/23/2012), HFrEF (heart failure with reduced ejection fraction) (HCC) (03/15/2017), Hyperlipemia (07/14/2012), Hyperlipidemia (02/25/2013), Hypertension (07/14/2012), Hypothyroid (06/06/2013), Large bowel stricture (HCC), Neuropathy (02/12/2015), Osteoarthritis, PAF (paroxysmal atrial fibrillation) (HCC), Pulmonary hypertension (HCC) (07/26/2016), Syncope (10/2018), and Thyroid disease.   Surgical History:   Past Surgical History:  Procedure Laterality Date   COLONOSCOPY N/A 03/12/2016   Procedure: COLONOSCOPY;  Surgeon: Dorena Cookey, MD;  Location: [facility] ENDOSCOPY;  Service: Endoscopy;  Laterality: N/A;   COLOSTOMY N/A 06/03/2014   Procedure: COLOSTOMY;  Surgeon: Harriette Bouillon, MD;  Location: [facility] OR;  Service: General;  Laterality: N/A;   FLEXIBLE SIGMOIDOSCOPY N/A 05/30/2014   Procedure: Arnell Sieving;  Surgeon: Jeani Hawking, MD;  Location: [facility] ENDOSCOPY;  Service: Endoscopy;  Laterality: N/A;   INSERTION OF VENA CAVA FILTER N/A 07/16/2012   Procedure: INSERTION OF VENA CAVA FILTER;  Surgeon: [name], MD;  Location: [facility] CATH LAB;  Service: Cardiovascular;  Laterality: N/A;   KNEE ARTHROSCOPY     LEFT HEART CATH AND CORONARY ANGIOGRAPHY N/A 01/01/2021    Procedure: LEFT HEART CATH AND CORONARY ANGIOGRAPHY;  Surgeon: Kathleene Hazel, MD;  Location: [facility] INVASIVE CV LAB;  Service: Cardiovascular;  Laterality: N/A;   LEFT HEART CATH AND CORONARY ANGIOGRAPHY N/A 04/29/2022   Procedure: LEFT HEART CATH AND CORONARY ANGIOGRAPHY;  Surgeon: Orbie Pyo, MD;  Location: [facility] INVASIVE CV LAB;  Service: Cardiovascular;  Laterality: N/A;   PARTIAL COLECTOMY N/A 06/03/2014   Procedure: PARTIAL COLECTOMY;  Surgeon: Harriette Bouillon, MD;  Location: [facility] OR;  Service: General;  Laterality: N/A;   REDUCTION MAMMAPLASTY Bilateral    RIGHT HEART CATH N/A 12/30/2020   Procedure: RIGHT HEART CATH;  Surgeon: Kathleene Hazel, MD;  Location: [facility] INVASIVE CV LAB;  Service: Cardiovascular;  Laterality: N/A;   SP ARTHRO HIP*L*       Social History:   reports that she quit smoking about 54 years ago. Her smoking use included cigarettes. She started smoking about 64 years ago. She has a 10 pack-year smoking history. She has never used smokeless tobacco. She reports that she does not drink alcohol and does not use drugs.   Family History:  Her family history includes Breast cancer in her mother.   Allergies Allergies  Allergen Reactions   Augmentin [Amoxicillin-Pot Clavulanate] Itching and Other (See Comments)    Severe vaginal itching   Tranxene [Clorazepate] Itching   Crestor [Rosuvastatin]     Made her sick on stomach, she is able to tolerate the zocor   Penicillins Itching and Rash     Home Medications  Prior to Admission medications   Medication Sig Start Date End Date Taking? Authorizing Provider  acetaminophen (TYLENOL) 325 MG tablet Take 2 tablets (650 mg total) by mouth every 6 (six)  hours as needed for mild pain (or Fever >/= 101). 08/28/22   Simonne Martinet, NP  acetaminophen (TYLENOL) 650 MG suppository Place 1 suppository (650 mg total) rectally every 6 (six) hours as needed for mild pain (or Fever >/= 101). 08/28/22   Simonne Martinet, NP   DULoxetine (CYMBALTA) 30 MG capsule Take 1 capsule (30 mg total) by mouth daily. 08/29/22   Simonne Martinet, NP  HYDROmorphone (DILAUDID) 1 MG/ML injection Inject 0.25 mLs (0.25 mg total) into the vein every 2 (two) hours as needed for severe pain. 08/28/22   Simonne Martinet, NP  levothyroxine (SYNTHROID) 25 MCG tablet Take 25 mcg by mouth daily before breakfast.    [provider]  nitroGLYCERIN (NITROSTAT) 0.4 MG SL tablet Place 1 tablet (0.4 mg total) under the tongue every 5 (five) minutes as needed for chest pain. 08/28/22   Simonne Martinet, NP  norepinephrine (LEVOPHED) 4-5 MG/250ML-% SOLN Inject 0-40 mcg/min into the vein continuous. 08/28/22   Simonne Martinet, NP  ondansetron [facility]) 4 MG/2ML SOLN injection Inject 2 mLs (4 mg total) into the vein every 6 (six) hours as needed for nausea or vomiting. 08/28/22   Simonne Martinet, NP  pantoprazole (PROTONIX) 40 MG injection Inject 40 mg into the vein daily. 08/28/22   Simonne Martinet, NP  sodium bicarbonate 650 MG tablet Take 1 tablet (650 mg total) by mouth 3 (three) times daily. 08/28/22   Simonne Martinet, NP     Critical care provider statement:   Total critical care time: 33 minutes   Performed by: Karna Christmas MD   Critical care time was exclusive of separately billable procedures and treating other patients.   Critical care was necessary to treat or prevent imminent or life-threatening deterioration.   Critical care was time spent personally by me on the following activities: development of treatment plan with patient and/or surrogate as well as nursing, discussions with consultants, evaluation of patient's response to treatment, examination of patient, obtaining history from patient or surrogate, ordering and performing treatments and interventions, ordering and review of laboratory studies, ordering and review of radiographic studies, pulse oximetry and re-evaluation of patient's condition.    Vida Rigger, M.D.   Pulmonary & Critical Care Medicine

## 2022-10-02 NOTE — Plan of Care (Signed)
  Problem: Cardiovascular: Goal: Ability to achieve and maintain adequate cardiovascular perfusion will improve Outcome: Progressing   Problem: Coping: Goal: Will verbalize positive feelings about self Outcome: Progressing Goal: Will identify appropriate support needs Outcome: Progressing   Problem: Health Behavior/Discharge Planning: Goal: Goals will be collaboratively established with patient/family Outcome: Progressing   Problem: Self-Care: Goal: Ability to participate in self-care as condition permits will improve Outcome: Progressing Goal: Verbalization of feelings and concerns over difficulty with self-care will improve Outcome: Progressing Goal: Ability to communicate needs accurately will improve Outcome: Progressing   Problem: Nutrition: Goal: Risk of aspiration will decrease Outcome: Progressing   Problem: Education: Goal: Knowledge of General Education information will improve Description: Including pain rating scale, medication(s)/side effects and non-pharmacologic comfort measures Outcome: Progressing   Problem: Clinical Measurements: Goal: Ability to maintain clinical measurements within normal limits will improve Outcome: Progressing Goal: Respiratory complications will improve Outcome: Progressing Goal: Cardiovascular complication will be avoided Outcome: Progressing   Problem: Coping: Goal: Level of anxiety will decrease Outcome: Progressing   Problem: Pain Managment: Goal: General experience of comfort will improve Outcome: Progressing   Problem: Skin Integrity: Goal: Risk for impaired skin integrity will decrease Outcome: Progressing   Problem: Coping: Goal: Ability to adjust to condition or change in health will improve Outcome: Progressing   Problem: Fluid Volume: Goal: Ability to maintain a balanced intake and output will improve Outcome: Progressing   Problem: Health Behavior/Discharge Planning: Goal: Ability to identify and utilize  available resources and services will improve Outcome: Progressing   Problem: Metabolic: Goal: Ability to maintain appropriate glucose levels will improve Outcome: Progressing   Problem: Skin Integrity: Goal: Risk for impaired skin integrity will decrease Outcome: Progressing   Problem: Tissue Perfusion: Goal: Adequacy of tissue perfusion will improve Outcome: Progressing

## 2022-10-02 NOTE — TOC Progression Note (Signed)
Transition of Care [facility]) - Progression Note    Patient Details  Name: Hannah [name] MRN: [id number] Date of Birth: 1940-06-10  Transition of Care [facility]) CM/SW Contact  Liliana Cline, LCSW Phone Number: 10/02/2022, 11:51 AM  Clinical Narrative:    [facility] Case ID: [id number]   Notified by NP that Casimer Bilis needs additional information. Spoke to patient's daughter Elon Jester by phone. Elon Jester states she is wanting to make sure [employer] has everything they need for the grandson Jill Alexanders to come visit patient.   Called [employer] [phone], spoke to [facility] then was transferred to [place] with [employer]. Val states the message was successfully delivered to the service member's (Justin's) command and unit 20 hours ago. She confirms they have everything they need for verification. Inquired about next steps, she states nothing else is needed from hospital staff.         Expected Discharge Plan and Services                                               Social Determinants of Health (SDOH) Interventions SDOH Screenings   Food Insecurity: No Food Insecurity (09/15/2022)   Received from [facility]  Housing: Low Risk  (05/20/2022)  Transportation Needs: No Transportation Needs (09/15/2022)   Received from [facility]  Utilities: Not At Risk (09/15/2022)   Received from [facility]  Alcohol Screen: Low Risk  (04/29/2022)  Depression (PHQ2-9): Low Risk  (09/13/2021)  Financial Resource Strain: Low Risk  (09/15/2022)   Received from [facility]  Physical Activity: Insufficiently Active (06/24/2021)  Social Connections: Moderately Integrated (06/24/2021)  Stress: No Stress Concern Present (06/24/2021)  Tobacco Use: Medium Risk (09/28/2022)    Readmission Risk Interventions    01/04/2021    1:14 PM  Readmission Risk Prevention Plan  Transportation Screening Complete  PCP or Specialist Appt within 5-7 Days --   Home Care Screening Complete  Medication Review (RN CM) Complete

## 2022-10-03 DIAGNOSIS — E871 Hypo-osmolality and hyponatremia: Secondary | ICD-10-CM | POA: Diagnosis present

## 2022-10-03 DIAGNOSIS — L8990 Pressure ulcer of unspecified site, unspecified stage: Secondary | ICD-10-CM | POA: Insufficient documentation

## 2022-10-03 DIAGNOSIS — A419 Sepsis, unspecified organism: Secondary | ICD-10-CM | POA: Diagnosis not present

## 2022-10-03 DIAGNOSIS — R578 Other shock: Secondary | ICD-10-CM

## 2022-10-03 DIAGNOSIS — Z515 Encounter for palliative care: Secondary | ICD-10-CM | POA: Diagnosis not present

## 2022-10-03 DIAGNOSIS — J90 Pleural effusion, not elsewhere classified: Secondary | ICD-10-CM | POA: Diagnosis present

## 2022-10-03 DIAGNOSIS — E875 Hyperkalemia: Secondary | ICD-10-CM | POA: Diagnosis present

## 2022-10-03 DIAGNOSIS — S13101A Dislocation of unspecified cervical vertebrae, initial encounter: Secondary | ICD-10-CM | POA: Diagnosis not present

## 2022-10-03 DIAGNOSIS — R579 Shock, unspecified: Secondary | ICD-10-CM | POA: Diagnosis not present

## 2022-10-03 DIAGNOSIS — N179 Acute kidney failure, unspecified: Secondary | ICD-10-CM | POA: Diagnosis present

## 2022-10-03 DIAGNOSIS — D62 Acute posthemorrhagic anemia: Secondary | ICD-10-CM | POA: Diagnosis present

## 2022-10-03 LAB — THYROID PANEL WITH TSH
Free Thyroxine Index: 2.6 (ref 1.2–4.9)
T3 Uptake Ratio: 35 % (ref 24–39)
T4, Total: 7.4 ug/dL (ref 4.5–12.0)
TSH: 2.29 u[IU]/mL (ref 0.450–4.500)

## 2022-10-03 NOTE — Progress Notes (Signed)
Received patient into room 123. Patient awake and settled. Patient denied pain. Will continue to monitor as per plan of care.

## 2022-10-03 NOTE — Progress Notes (Signed)
Patient is on comfort measures. PRN meds given. Pt rested comfortably through out the shift. Family at bedside.

## 2022-10-03 NOTE — Progress Notes (Addendum)
Progress Note    Hannah [name]  AOZ:[id number] DOB: [birth year]  DOA: 2022-10-04 PCP: [name], MD      Brief Narrative:    Medical records reviewed and are as summarized below:  Hannah [name] is a 82 y.o. female with past medical history significant for recent C1/C2 fracture and central cord syndrome approximately 1 month ago following a fall at home status post laminectomy at [facility], PE, HFpEF, CKD Stage IIIa, DM Type 2, HTN, paroxsymal A.fib, Hypothyroidism.  She was brought from the nursing home to the emergency department because of hemoglobin of 6.8.  Of note, details of recent fracture are as follows: She had a fall on 8/21 with associated neck pain and bilateral weakness. She was admitted at [facility] where CT showed C1-C1 fracture and a small subarachnoid hemorrhage. MRI obtained showed C1 and C2 fracture, ossification of posterior longitudinal ligament, cervical spondylosis, cervical spinal cord injury, central cord syndrome.  Initial plan by neuro surgery at [facility] was for posterior cervical decompression, instrumentation and fusion from C1-C6 to be done on 8/28, but it was felt she would be best served by medically stabilizing and correcting coagulopathy prior surgery.  However on 8/25 family requested she be transferred to [facility] for Neurosurgical intervention there.    She was admitted to the ICU for hypovolemic/hemorrhagic/septic shock and severe anemia.  She was treated with IV fluids, empiric IV antibiotics and IV vasopressors.  She was also transfused with 2 units of packed red blood cells.  Workup also showed that she had new C1 and C2 dislocation on top of recent C1 and C2 fractures with resultant severe spinal stenosis at C1-C2.  Dr. Katrinka Blazing, neurosurgeon, was consulted to assist with management.  He discussed the case with neurosurgeon at [facility] who recommended nonsurgical approach.   Patient's family opted for comfort measures because of her  poor prognosis.  Extensive chart review was done.   Assessment/Plan:   Principal Problem:   Hemorrhagic shock (HCC) Active Problems:   Pulmonary hypertension (HCC)   CKD stage 3a, GFR 45-59 ml/min (HCC)   CAD (coronary artery disease)   Paroxysmal atrial fibrillation (HCC)   Chronic diastolic CHF (congestive heart failure) (HCC)   Closed dislocation of cervical vertebra   Pressure injury of skin   AKI (acute kidney injury) (HCC)   Acute blood loss anemia   Hyperkalemia   Hyponatremia   Pleural effusion on right    Body mass index is 31.49 kg/m.  (Morbid obesity)   Hypovolemic/hemorrhagic/septic shock  Autonomic dysfunction in the setting of recent C1 and C2 spine fracture s/p laminectomy at [facility], now with worsening dislocation and significant spinal stenosis  Severe sepsis of unknown source  Acute blood loss anemia s/p transfusion with 2 units of PRBCs   Acute kidney injury on CKD stage IIIa  Acute metabolic encephalopathy  Type 2 diabetes mellitus  Right iliopsoas intramuscular hematoma  Chronic diastolic CHF  Pulmonary hypertension  Hyperkalemia  Hypoglycemia  Hyponatremia  Hypothyroidism  Small to moderate right pleural effusion without injury atelectasis   PLAN  Continue comfort measures. Patient family wants patient to have pleasure feeding despite risk of aspiration. Follow-up with palliative care team.   Diet Order             Diet regular Room service appropriate? Yes; Fluid consistency: Thin  Diet effective now  Consultants: Intensivist General surgeon Neurosurgeon Palliative care  Procedures: None    Medications:    Continuous Infusions:   Anti-infectives (From admission, onward)    Start     Dose/Rate Route Frequency Ordered Stop   10/01/22 1500  vancomycin (VANCOREADY) IVPB 750 mg/150 mL  Status:  Discontinued        750 mg 150 mL/hr over 60 Minutes Intravenous   Once 09/21/2022 1809 10/01/22 1028   10/01/22 1200  cefTRIAXone (ROCEPHIN) 2 g in sodium chloride 0.9 % 100 mL IVPB  Status:  Discontinued        2 g 200 mL/hr over 30 Minutes Intravenous Daily 10/01/22 1028 10/02/22 1404   09/17/2022 1830  ceFEPIme (MAXIPIME) 2 g in sodium chloride 0.9 % 100 mL IVPB  Status:  Discontinued        2 g 200 mL/hr over 30 Minutes Intravenous Every 24 hours 10/02/2022 1805 10/01/22 0845   10/01/2022 1315  vancomycin (VANCOREADY) IVPB 2000 mg/400 mL        2,000 mg 200 mL/hr over 120 Minutes Intravenous  Once 09/16/2022 1312 09/12/2022 1750   09/14/2022 0945  cefTRIAXone (ROCEPHIN) 2 g in sodium chloride 0.9 % 100 mL IVPB  Status:  Discontinued        2 g 200 mL/hr over 30 Minutes Intravenous Every 24 hours 09/24/2022 0935 09/17/2022 1744              Family Communication/Anticipated D/C date and plan/Code Status   DVT prophylaxis:      Code Status: Do not attempt resuscitation (DNR) - Comfort care  Family Communication: None Disposition Plan: Possible discharge to hospice house   Status is: Inpatient Remains inpatient appropriate because: Comfort care       Subjective:   Interval events noted.  She is confused and cannot provide any history.  Objective:    Vitals:   10/03/22 0500 10/03/22 0600 10/03/22 0637 10/03/22 0718  BP:    (!) 131/94  Pulse:    67  Resp: 11 12 14 16   Temp: (!) 97.4 F (36.3 C) (!) 97.2 F (36.2 C) 97.7 F (36.5 C) 98.2 F (36.8 C)  TempSrc:      SpO2:    100%  Weight:       No data found.   Intake/Output Summary (Last 24 hours) at 10/03/2022 1610 Last data filed at 10/03/2022 0600 Gross per 24 hour  Intake --  Output 250 ml  Net -250 ml   Filed Weights   09/24/2022 0839 09/12/2022 2030 10/01/22 4401  Weight: 90.9 kg 86.6 kg 91.2 kg    Exam:   GEN: NAD SKIN: Warm and dry EYES: EOMI ENT: MMM, cervical collar in place CV: RRR PULM: CTA B ABD: soft, ND, NT, +BS, +PEG tube, + left lower quadrant  colostomy CNS: AAO x 1 (person), slurred speech EXT: B/l lower extremity edema. Edema in right upper extremity. No tenderness GU: Foley cath draining amber urine      Pressure Injury 10/01/22 Buttocks Stage 2 -  Partial thickness loss of dermis presenting as a shallow open injury with a red, pink wound bed without slough. (Active)  10/01/22 1559  Location: Buttocks  Location Orientation:   Staging: Stage 2 -  Partial thickness loss of dermis presenting as a shallow open injury with a red, pink wound bed without slough.  Wound Description (Comments):   Present on Admission: Yes  Dressing Type Foam - Lift dressing to assess site every shift 10/02/22 1600  Pressure Injury 10/01/22 Buttocks Left Stage 2 -  Partial thickness loss of dermis presenting as a shallow open injury with a red, pink wound bed without slough. (Active)  10/01/22 1602  Location: Buttocks  Location Orientation: Left  Staging: Stage 2 -  Partial thickness loss of dermis presenting as a shallow open injury with a red, pink wound bed without slough.  Wound Description (Comments):   Present on Admission: Yes  Dressing Type Foam - Lift dressing to assess site every shift 10/02/22 1600     Data Reviewed:   I have personally reviewed following labs and imaging studies:  Labs: Labs show the following:   Basic Metabolic Panel: Recent Labs  Lab Oct 22, 2022 0847 2022/10/22 2202 10/01/22 0354 10/01/22 0355  NA 129* 132*  --  132*  K 5.8* 4.8  --  4.8  CL 92* 99  --  100  CO2 25 24  --  24  GLUCOSE 174* 144*  --  121*  BUN 62* 53*  --  50*  CREATININE 1.79* 1.39*  --  1.27*  CALCIUM 8.7* 8.2*  --  8.0*  MG  --   --  2.1  --   PHOS  --   --  3.0 3.1   GFR Estimated Creatinine Clearance: 40.3 mL/min (A) (by C-G formula based on SCr of 1.27 mg/dL (H)). Liver Function Tests: Recent Labs  Lab 10/22/22 0847 10/01/22 0355  AST 25  --   ALT 14  --   ALKPHOS 67  --   BILITOT 0.6  --   PROT 5.9*  --   ALBUMIN  2.4* 1.9*   No results for input(s): "LIPASE", "AMYLASE" in the last 168 hours. No results for input(s): "AMMONIA" in the last 168 hours. Coagulation profile Recent Labs  Lab 2022-10-22 1022  INR 1.3*    CBC: Recent Labs  Lab 10/22/22 0847 10/22/22 2202 10/01/22 0354  WBC 27.5*  --  22.2*  NEUTROABS 22.1*  --   --   HGB 6.3* 7.7* 6.6*  HCT 21.4* 23.6* 21.6*  MCV 99.1  --  94.3  PLT 378  --  334   Cardiac Enzymes: No results for input(s): "CKTOTAL", "CKMB", "CKMBINDEX", "TROPONINI" in the last 168 hours. BNP (last 3 results) No results for input(s): "PROBNP" in the last 8760 hours. CBG: Recent Labs  Lab 10/02/22 0326 10/02/22 0734 10/02/22 0808 10/02/22 0811 10/02/22 1118  GLUCAP 71 64* 63* 81 99   D-Dimer: No results for input(s): "DDIMER" in the last 72 hours. Hgb A1c: No results for input(s): "HGBA1C" in the last 72 hours. Lipid Profile: No results for input(s): "CHOL", "HDL", "LDLCALC", "TRIG", "CHOLHDL", "LDLDIRECT" in the last 72 hours. Thyroid function studies: No results for input(s): "TSH", "T4TOTAL", "T3FREE", "THYROIDAB" in the last 72 hours.  Invalid input(s): "FREET3" Anemia work up: No results for input(s): "VITAMINB12", "FOLATE", "FERRITIN", "TIBC", "IRON", "RETICCTPCT" in the last 72 hours. Sepsis Labs: Recent Labs  Lab Oct 22, 2022 0847 2022-10-22 1022 2022/10/22 2202 10/01/22 0354  PROCALCITON  --   --  0.23  --   WBC 27.5*  --   --  22.2*  LATICACIDVEN  --  1.9 1.5  --     Microbiology Recent Results (from the past 240 hour(s))  Resp panel by RT-PCR (RSV, Flu A&B, Covid) Anterior Nasal Swab     Status: None   Collection Time: 22-Oct-2022  8:47 AM   Specimen: Anterior Nasal Swab  Result Value Ref Range Status   SARS Coronavirus 2  by RT PCR NEGATIVE NEGATIVE Final    Comment: (NOTE) SARS-CoV-2 target nucleic acids are NOT DETECTED.  The SARS-CoV-2 RNA is generally detectable in upper respiratory specimens during the acute phase of infection.  The lowest concentration of SARS-CoV-2 viral copies this assay can detect is 138 copies/mL. A negative result does not preclude SARS-Cov-2 infection and should not be used as the sole basis for treatment or other patient management decisions. A negative result may occur with  improper specimen collection/handling, submission of specimen other than nasopharyngeal swab, presence of viral mutation(s) within the areas targeted by this assay, and inadequate number of viral copies(<138 copies/mL). A negative result must be combined with clinical observations, patient history, and epidemiological information. The expected result is Negative.  Fact Sheet for Patients:  BloggerCourse.com  Fact Sheet for Healthcare Providers:  SeriousBroker.it  This test is no t yet approved or cleared by the [place] FDA and  has been authorized for detection and/or diagnosis of SARS-CoV-2 by FDA under an Emergency Use Authorization (EUA). This EUA will remain  in effect (meaning this test can be used) for the duration of the COVID-19 declaration under Section 564(b)(1) of the Act, 21 U.S.C.section 360bbb-3(b)(1), unless the authorization is terminated  or revoked sooner.       Influenza A by PCR NEGATIVE NEGATIVE Final   Influenza B by PCR NEGATIVE NEGATIVE Final    Comment: (NOTE) The Xpert Xpress SARS-CoV-2/FLU/RSV plus assay is intended as an aid in the diagnosis of influenza from Nasopharyngeal swab specimens and should not be used as a sole basis for treatment. Nasal washings and aspirates are unacceptable for Xpert Xpress SARS-CoV-2/FLU/RSV testing.  Fact Sheet for Patients: BloggerCourse.com  Fact Sheet for Healthcare Providers: SeriousBroker.it  This test is not yet approved or cleared by the [place] FDA and has been authorized for detection and/or diagnosis of SARS-CoV-2 by FDA  under an Emergency Use Authorization (EUA). This EUA will remain in effect (meaning this test can be used) for the duration of the COVID-19 declaration under Section 564(b)(1) of the Act, 21 U.S.C. section 360bbb-3(b)(1), unless the authorization is terminated or revoked.     Resp Syncytial Virus by PCR NEGATIVE NEGATIVE Final    Comment: (NOTE) Fact Sheet for Patients: BloggerCourse.com  Fact Sheet for Healthcare Providers: SeriousBroker.it  This test is not yet approved or cleared by the [place] FDA and has been authorized for detection and/or diagnosis of SARS-CoV-2 by FDA under an Emergency Use Authorization (EUA). This EUA will remain in effect (meaning this test can be used) for the duration of the COVID-19 declaration under Section 564(b)(1) of the Act, 21 U.S.C. section 360bbb-3(b)(1), unless the authorization is terminated or revoked.  Performed at [facility], [address] [place], [place] [address]   Blood Culture (routine x 2)     Status: None (Preliminary result)   Collection Time: 10/02/2022 10:22 AM   Specimen: BLOOD  Result Value Ref Range Status   Specimen Description BLOOD BLOOD LEFT ARM  Final   Special Requests   Final    BOTTLES DRAWN AEROBIC AND ANAEROBIC Blood Culture adequate volume   Culture   Final    NO GROWTH 3 DAYS Performed at [facility], [address] [place], [place] [address]    Report Status PENDING  Incomplete  MRSA Next Gen by PCR, Nasal     Status: None   Collection Time: 09/08/2022  8:21 PM   Specimen: Nasal Mucosa; Nasal Swab  Result  Value Ref Range Status   MRSA by PCR Next Gen NOT DETECTED NOT DETECTED Final    Comment: (NOTE) The GeneXpert MRSA Assay (FDA approved for NASAL specimens only), is one component of a comprehensive MRSA colonization surveillance program. It is not intended to diagnose MRSA infection nor to guide or monitor treatment for  MRSA infections. Test performance is not FDA approved in patients less than 17 years old. Performed at [facility], [address] [place], [place] [address]     Procedures and diagnostic studies:  No results found.             LOS: 3 days   Hannah [name]  [facility]   Pager on [email or web address]. If 7PM-7AM, please contact night-coverage at [email or web address]     10/03/2022, 4:10 PM

## 2022-10-03 NOTE — Progress Notes (Signed)
Attempted to call report to 1C, would not take report because they had not had time to look patient up.

## 2022-10-03 NOTE — Progress Notes (Addendum)
Palliative Care Progress Note, Assessment & Plan   Patient Name: Hannah [name]       Date: 10/03/2022 DOB: 06-13-1940  Age: 82 y.o. MRN#: [id number] Attending Physician: Lurene Shadow, MD Primary Care Physician: [name], MD Admit Date: 12-Oct-2022  Subjective: Patient is lying in bed in no apparent distress.  She acknowledges my presence and is able to make her wishes known.  No friends or family present during my visit.  HPI: 82 y.o. female  with past medical history of PE, HFpEF, CKD stage 3a, T2DM, HTN, paroxsymal atrial fibrillation and hypothyroidism admitted from [facility] on Oct 12, 2022 with anemia found on routine blood draw (hemoglobin 6.8).   Noted recent month long stay at [facility] s/p laminectomy for C1/C2 fracture with associated central cord syndrome and small subarachnoid hemorrhage after suffering a fall at home.   CT cervical spine 1. Known recent C1 and C2 fractures with increased fracture displacement and angulation including new complete posterior dislocation of the lateral masses of C1 relative to C2. Resultant severe spinal stenosis at C1-2.   CT abdomen/pelvis 1. Enlargement and heterogeneity of the right iliopsoas muscles with associated fat stranding is suspicious for an intramuscular hematoma. 2. Small to moderate right pleural effusion with associated atelectasis. 3. Anasarca.   Palliative medicine was consulted for assisting with goals of care conversations.  Summary of counseling/coordination of care: Extensive chart review completed prior to meeting patient including labs, vital signs, imaging, progress notes, orders, and available advanced directive documents from current and previous encounters.   After reviewing the patient's chart and  assessing the patient at bedside, I spoke with patient in regards to symptom management and plan of care.  Full comfort measures remain.  Symptoms assessed.  Patient endorses she is in no pain or discomfort at this time.  She shares she had difficulty getting pain medication consistently throughout the night.  I shared her I would be speaking with nursing staff to ensure she gets pain medication when needed.  Patient denied need for any as needed medications at this time.  She requested that the room air conditioner be turned down as it is a little too cold for her.  AC adjusted.  Plan remains to continue full comfort measures.  TOC following closely for discharge planning.  At family's request, I returned to bedside.  Patient's daughter was at bedside.  She wished to discuss comfort foods.  Family has requested seafood.  RN made aware and will speak with kitchen about getting pured seafood for patient.  Discussed food for Raynelle Fanning and fine in addition to aspiration precautions.  After meeting with the family, I received a message that patient's son wanted to speak with a member of PMT.  I returned son's phone call.  No answer.  HIPAA compliant voicemail left.  PMT will continue to follow and support patient throughout her hospitalization.  Physical Exam Vitals reviewed.  Constitutional:      General: She is not in acute distress.    Appearance: She is obese.  HENT:     Head: Normocephalic.     Mouth/Throat:     Mouth: Mucous membranes are moist.  Eyes:     Pupils:  Pupils are equal, round, and reactive to light.  Abdominal:     Palpations: Abdomen is soft.  Skin:    General: Skin is warm and dry.  Neurological:     Mental Status: She is alert and oriented to person, place, and time.  Psychiatric:        Mood and Affect: Mood normal.        Behavior: Behavior normal.        Thought Content: Thought content normal.        Judgment: Judgment normal.             Total Time 50 minutes    Time spent includes: Detailed review of medical records (labs, imaging, vital signs), medically appropriate exam (mental status, respiratory, cardiac, skin), discussed with treatment team, counseling and educating patient, family and staff, documenting clinical information, medication management and coordination of care.  Samara Deist L. Bonita Quin, DNP, FNP-BC Palliative Medicine Team

## 2022-10-03 NOTE — Progress Notes (Signed)
Called daughter Stevphen Rochester to notify of transfer to 123

## 2022-10-03 NOTE — Plan of Care (Signed)
Problem: Education: Goal: Understanding of CV disease, CV risk reduction, and recovery process will improve Outcome: Progressing   Problem: Activity: Goal: Ability to return to baseline activity level will improve Outcome: Progressing   Problem: Cardiovascular: Goal: Ability to achieve and maintain adequate cardiovascular perfusion will improve Outcome: Progressing   Problem: Health Behavior/Discharge Planning: Goal: Ability to safely manage health-related needs after discharge will improve Outcome: Progressing   Problem: Education: Goal: Knowledge of disease or condition will improve Outcome: Progressing Goal: Knowledge of secondary prevention will improve (MUST DOCUMENT ALL) Outcome: Progressing Goal: Knowledge of patient specific risk factors will improve Loraine Leriche N/A or DELETE if not current risk factor) Outcome: Progressing   Problem: Ischemic Stroke/TIA Tissue Perfusion: Goal: Complications of ischemic stroke/TIA will be minimized Outcome: Progressing   Problem: Coping: Goal: Will verbalize positive feelings about self Outcome: Progressing Goal: Will identify appropriate support needs Outcome: Progressing   Problem: Health Behavior/Discharge Planning: Goal: Ability to manage health-related needs will improve Outcome: Progressing Goal: Goals will be collaboratively established with patient/family Outcome: Progressing   Problem: Self-Care: Goal: Ability to participate in self-care as condition permits will improve Outcome: Progressing Goal: Verbalization of feelings and concerns over difficulty with self-care will improve Outcome: Progressing Goal: Ability to communicate needs accurately will improve Outcome: Progressing   Problem: Nutrition: Goal: Risk of aspiration will decrease Outcome: Progressing Goal: Dietary intake will improve Outcome: Progressing   Problem: Education: Goal: Knowledge of General Education information will improve Description:  Including pain rating scale, medication(s)/side effects and non-pharmacologic comfort measures Outcome: Progressing   Problem: Health Behavior/Discharge Planning: Goal: Ability to manage health-related needs will improve Outcome: Progressing   Problem: Clinical Measurements: Goal: Ability to maintain clinical measurements within normal limits will improve Outcome: Progressing Goal: Will remain free from infection Outcome: Progressing Goal: Diagnostic test results will improve Outcome: Progressing Goal: Respiratory complications will improve Outcome: Progressing Goal: Cardiovascular complication will be avoided Outcome: Progressing   Problem: Activity: Goal: Risk for activity intolerance will decrease Outcome: Progressing   Problem: Nutrition: Goal: Adequate nutrition will be maintained Outcome: Progressing   Problem: Coping: Goal: Level of anxiety will decrease Outcome: Progressing   Problem: Elimination: Goal: Will not experience complications related to bowel motility Outcome: Progressing Goal: Will not experience complications related to urinary retention Outcome: Progressing   Problem: Pain Managment: Goal: General experience of comfort will improve Outcome: Progressing   Problem: Safety: Goal: Ability to remain free from injury will improve Outcome: Progressing   Problem: Skin Integrity: Goal: Risk for impaired skin integrity will decrease Outcome: Progressing   Problem: Education: Goal: Ability to describe self-care measures that may prevent or decrease complications (Diabetes Survival Skills Education) will improve Outcome: Progressing Goal: Individualized Educational Video(s) Outcome: Progressing   Problem: Coping: Goal: Ability to adjust to condition or change in health will improve Outcome: Progressing   Problem: Fluid Volume: Goal: Ability to maintain a balanced intake and output will improve Outcome: Progressing   Problem: Health  Behavior/Discharge Planning: Goal: Ability to identify and utilize available resources and services will improve Outcome: Progressing Goal: Ability to manage health-related needs will improve Outcome: Progressing   Problem: Metabolic: Goal: Ability to maintain appropriate glucose levels will improve Outcome: Progressing   Problem: Nutritional: Goal: Maintenance of adequate nutrition will improve Outcome: Progressing Goal: Progress toward achieving an optimal weight will improve Outcome: Progressing   Problem: Skin Integrity: Goal: Risk for impaired skin integrity will decrease Outcome: Progressing   Problem: Tissue Perfusion: Goal: Adequacy of tissue perfusion will improve  Outcome: Progressing   Problem: Education: Goal: Knowledge of the prescribed therapeutic regimen will improve Outcome: Progressing   Problem: Coping: Goal: Ability to identify and develop effective coping behavior will improve Outcome: Progressing

## 2022-10-03 NOTE — Care Management Important Message (Signed)
Important Message  Patient Details  Name: Hannah [name] MRN: [id number] Date of Birth: 10-20-1940   Important Message Given:  N/A - LOS <3 / Initial given by admissions     Olegario Messier A Thiago Ragsdale 10/03/2022, 9:14 AM

## 2022-10-03 NOTE — Progress Notes (Signed)
Comfort measures only, patient lying in bed respirations unlabored, no visible signs of distress. Patient states she is not in pain.

## 2022-10-04 DIAGNOSIS — S1202XD Unstable burst fracture of first cervical vertebra, subsequent encounter for fracture with routine healing: Secondary | ICD-10-CM | POA: Diagnosis not present

## 2022-10-04 DIAGNOSIS — R578 Other shock: Secondary | ICD-10-CM | POA: Diagnosis not present

## 2022-10-04 DIAGNOSIS — Z515 Encounter for palliative care: Secondary | ICD-10-CM | POA: Diagnosis not present

## 2022-10-04 NOTE — Progress Notes (Addendum)
Progress Note    Sharlonda Shellhouse  XBM:[id number] DOB: [birth year]  DOA: 2022-10-19 PCP: [name], MD      Brief Narrative:    Medical records reviewed and are as summarized below:  Hannah [name] is a 82 y.o. female with past medical history significant for recent C1/C2 fracture and central cord syndrome approximately 1 month ago following a fall at home status post laminectomy at [facility], PE, HFpEF, CKD Stage IIIa, DM Type 2, HTN, paroxsymal A.fib, Hypothyroidism. Of note, she was brought to the emergency department on 09/28/2022 because of bleeding from a PICC line on her right arm. She was brought from the nursing home to the emergency department because of hemoglobin of 6.8 on 10-19-22.   Of note, details of recent fracture are as follows: She had a fall on 8/21 with associated neck pain and bilateral weakness. She was admitted at [facility] where CT showed C1-C1 fracture and a small subarachnoid hemorrhage. MRI obtained showed C1 and C2 fracture, ossification of posterior longitudinal ligament, cervical spondylosis, cervical spinal cord injury, central cord syndrome.  Initial plan by neuro surgery at [facility] was for posterior cervical decompression, instrumentation and fusion from C1-C6 to be done on 8/28, but it was felt she would be best served by medically stabilizing and correcting coagulopathy prior surgery.  However on 8/25 family requested she be transferred to [facility] for Neurosurgical intervention there.    She was admitted to the ICU for hypovolemic/hemorrhagic/septic shock and severe anemia.  She was treated with IV fluids, empiric IV antibiotics and IV vasopressors.  She was also transfused with 2 units of packed red blood cells.  Workup also showed that she had new C1 and C2 dislocation on top of recent C1 and C2 fractures with resultant severe spinal stenosis at C1-C2.  Dr. Katrinka Blazing, neurosurgeon, was consulted to assist with management.  He discussed the case with  neurosurgeon at [facility] who recommended nonsurgical approach.   Patient's family opted for comfort measures because of her poor prognosis.  Extensive chart review was done.   Assessment/Plan:   Principal Problem:   Hemorrhagic shock (HCC) Active Problems:   Pulmonary hypertension (HCC)   CKD stage 3a, GFR 45-59 ml/min (HCC)   CAD (coronary artery disease)   Paroxysmal atrial fibrillation (HCC)   Chronic diastolic CHF (congestive heart failure) (HCC)   Closed dislocation of cervical vertebra   Pressure injury of skin   AKI (acute kidney injury) (HCC)   Acute blood loss anemia   Hyperkalemia   Hyponatremia   Pleural effusion on right    Body mass index is 31.49 kg/m.  (Morbid obesity)   Hypovolemic/hemorrhagic/septic shock  Autonomic dysfunction in the setting of recent C1 and C2 spine fracture s/p laminectomy at [facility], now with worsening dislocation and significant spinal stenosis  Severe sepsis of unknown source  Acute blood loss anemia s/p transfusion with 2 units of PRBCs   Acute kidney injury on CKD stage IIIa  Acute metabolic encephalopathy  Type 2 diabetes mellitus  Right iliopsoas intramuscular hematoma  Chronic diastolic CHF  Pulmonary hypertension  Hyperkalemia  Hypoglycemia  Hyponatremia  Hypothyroidism  Small to moderate right pleural effusion without injury atelectasis   PLAN  Continue comfort care Hospice team has been consulted to assist with disposition Plan of care was discussed with [name], daughter, over the phone Plan of care was discussed with Sharlet Salina, son, at the bedside.  Sharlet Salina said he had spoken to admissions coordinator at  [facility] and he is hoping that patient can go to [facility] for hospice care.  He does not want patient to go back to [facility].  Lucendia Herrlich, Child psychotherapist and Diannia Ruder, hospice nurse have been updated.   Diet Order             DIET - DYS 1 Room service  appropriate? Yes; Fluid consistency: Thin  Diet effective now                            Consultants: Intensivist General surgeon Neurosurgeon Palliative care  Procedures: None    Medications:    Continuous Infusions:   Anti-infectives (From admission, onward)    Start     Dose/Rate Route Frequency Ordered Stop   10/01/22 1500  vancomycin (VANCOREADY) IVPB 750 mg/150 mL  Status:  Discontinued        750 mg 150 mL/hr over 60 Minutes Intravenous  Once 09/14/2022 1809 10/01/22 1028   10/01/22 1200  cefTRIAXone (ROCEPHIN) 2 g in sodium chloride 0.9 % 100 mL IVPB  Status:  Discontinued        2 g 200 mL/hr over 30 Minutes Intravenous Daily 10/01/22 1028 10/02/22 1404   09/20/2022 1830  ceFEPIme (MAXIPIME) 2 g in sodium chloride 0.9 % 100 mL IVPB  Status:  Discontinued        2 g 200 mL/hr over 30 Minutes Intravenous Every 24 hours 09/28/2022 1805 10/01/22 0845   09/08/2022 1315  vancomycin (VANCOREADY) IVPB 2000 mg/400 mL        2,000 mg 200 mL/hr over 120 Minutes Intravenous  Once 09/25/2022 1312 10/02/2022 1750   09/29/2022 0945  cefTRIAXone (ROCEPHIN) 2 g in sodium chloride 0.9 % 100 mL IVPB  Status:  Discontinued        2 g 200 mL/hr over 30 Minutes Intravenous Every 24 hours 09/14/2022 0935 09/11/2022 1744              Family Communication/Anticipated D/C date and plan/Code Status   DVT prophylaxis:      Code Status: Do not attempt resuscitation (DNR) - Comfort care  Family Communication: Plan discussed with [name], daughter, over the phone Disposition Plan: Possible discharge with hospice services    Status is: Inpatient Remains inpatient appropriate because: Comfort care       Subjective:   Interval events noted.  She has no complaints.  Objective:    Vitals:   10/03/22 0500 10/03/22 0600 10/03/22 0637 10/03/22 0718  BP:    (!) 131/94  Pulse:    67  Resp: 11 12 14 16   Temp: (!) 97.4 F (36.3 C) (!) 97.2 F (36.2 C) 97.7 F (36.5 C)  98.2 F (36.8 C)  TempSrc:      SpO2:    100%  Weight:       No data found.  No intake or output data in the 24 hours ending 10/04/22 1333  Filed Weights   09/23/2022 0839 09/27/2022 2030 10/01/22 1610  Weight: 90.9 kg 86.6 kg 91.2 kg    Exam:  GEN: NAD SKIN: Warm and dry EYES: No pallor or icterus ENT: MMM CV: RRR PULM: CTA B ABD: soft, ND, NT, +BS, + PEG tube, + left lower quadrant colostomy CNS: AAO x 1 (person), non focal EXT: Right upper extremity and right lower extremity edema GU: Foley catheter draining amber urine   Pressure Injury 10/01/22 Buttocks Stage 2 -  Partial thickness  loss of dermis presenting as a shallow open injury with a red, pink wound bed without slough. (Active)  10/01/22 1559  Location: Buttocks  Location Orientation:   Staging: Stage 2 -  Partial thickness loss of dermis presenting as a shallow open injury with a red, pink wound bed without slough.  Wound Description (Comments):   Present on Admission: Yes  Dressing Type Foam - Lift dressing to assess site every shift 10/03/22 2257     Pressure Injury 10/01/22 Buttocks Left Stage 2 -  Partial thickness loss of dermis presenting as a shallow open injury with a red, pink wound bed without slough. (Active)  10/01/22 1602  Location: Buttocks  Location Orientation: Left  Staging: Stage 2 -  Partial thickness loss of dermis presenting as a shallow open injury with a red, pink wound bed without slough.  Wound Description (Comments):   Present on Admission: Yes  Dressing Type Foam - Lift dressing to assess site every shift 10/03/22 2257     Data Reviewed:   I have personally reviewed following labs and imaging studies:  Labs: Labs show the following:   Basic Metabolic Panel: Recent Labs  Lab 09/04/2022 0847 09/13/2022 2202 10/01/22 0354 10/01/22 0355  NA 129* 132*  --  132*  K 5.8* 4.8  --  4.8  CL 92* 99  --  100  CO2 25 24  --  24  GLUCOSE 174* 144*  --  121*  BUN 62* 53*  --  50*   CREATININE 1.79* 1.39*  --  1.27*  CALCIUM 8.7* 8.2*  --  8.0*  MG  --   --  2.1  --   PHOS  --   --  3.0 3.1   GFR Estimated Creatinine Clearance: 40.3 mL/min (A) (by C-G formula based on SCr of 1.27 mg/dL (H)). Liver Function Tests: Recent Labs  Lab 09/15/2022 0847 10/01/22 0355  AST 25  --   ALT 14  --   ALKPHOS 67  --   BILITOT 0.6  --   PROT 5.9*  --   ALBUMIN 2.4* 1.9*   No results for input(s): "LIPASE", "AMYLASE" in the last 168 hours. No results for input(s): "AMMONIA" in the last 168 hours. Coagulation profile Recent Labs  Lab 09/11/2022 1022  INR 1.3*    CBC: Recent Labs  Lab 09/11/2022 0847 09/19/2022 2202 10/01/22 0354  WBC 27.5*  --  22.2*  NEUTROABS 22.1*  --   --   HGB 6.3* 7.7* 6.6*  HCT 21.4* 23.6* 21.6*  MCV 99.1  --  94.3  PLT 378  --  334   Cardiac Enzymes: No results for input(s): "CKTOTAL", "CKMB", "CKMBINDEX", "TROPONINI" in the last 168 hours. BNP (last 3 results) No results for input(s): "PROBNP" in the last 8760 hours. CBG: Recent Labs  Lab 10/02/22 0326 10/02/22 0734 10/02/22 0808 10/02/22 0811 10/02/22 1118  GLUCAP 71 64* 63* 81 99   D-Dimer: No results for input(s): "DDIMER" in the last 72 hours. Hgb A1c: No results for input(s): "HGBA1C" in the last 72 hours. Lipid Profile: No results for input(s): "CHOL", "HDL", "LDLCALC", "TRIG", "CHOLHDL", "LDLDIRECT" in the last 72 hours. Thyroid function studies: No results for input(s): "TSH", "T4TOTAL", "T3FREE", "THYROIDAB" in the last 72 hours.  Invalid input(s): "FREET3" Anemia work up: No results for input(s): "VITAMINB12", "FOLATE", "FERRITIN", "TIBC", "IRON", "RETICCTPCT" in the last 72 hours. Sepsis Labs: Recent Labs  Lab 09/28/2022 0847 09/05/2022 1022 09/27/2022 2202 10/01/22 0354  PROCALCITON  --   --  0.23  --   WBC 27.5*  --   --  22.2*  LATICACIDVEN  --  1.9 1.5  --     Microbiology Recent Results (from the past 240 hour(s))  Resp panel by RT-PCR (RSV, Flu A&B,  Covid) Anterior Nasal Swab     Status: None   Collection Time: 09/04/2022  8:47 AM   Specimen: Anterior Nasal Swab  Result Value Ref Range Status   SARS Coronavirus 2 by RT PCR NEGATIVE NEGATIVE Final    Comment: (NOTE) SARS-CoV-2 target nucleic acids are NOT DETECTED.  The SARS-CoV-2 RNA is generally detectable in upper respiratory specimens during the acute phase of infection. The lowest concentration of SARS-CoV-2 viral copies this assay can detect is 138 copies/mL. A negative result does not preclude SARS-Cov-2 infection and should not be used as the sole basis for treatment or other patient management decisions. A negative result may occur with  improper specimen collection/handling, submission of specimen other than nasopharyngeal swab, presence of viral mutation(s) within the areas targeted by this assay, and inadequate number of viral copies(<138 copies/mL). A negative result must be combined with clinical observations, patient history, and epidemiological information. The expected result is Negative.  Fact Sheet for Patients:  BloggerCourse.com  Fact Sheet for Healthcare Providers:  SeriousBroker.it  This test is no t yet approved or cleared by the [place] FDA and  has been authorized for detection and/or diagnosis of SARS-CoV-2 by FDA under an Emergency Use Authorization (EUA). This EUA will remain  in effect (meaning this test can be used) for the duration of the COVID-19 declaration under Section 564(b)(1) of the Act, 21 U.S.C.section 360bbb-3(b)(1), unless the authorization is terminated  or revoked sooner.       Influenza A by PCR NEGATIVE NEGATIVE Final   Influenza B by PCR NEGATIVE NEGATIVE Final    Comment: (NOTE) The Xpert Xpress SARS-CoV-2/FLU/RSV plus assay is intended as an aid in the diagnosis of influenza from Nasopharyngeal swab specimens and should not be used as a sole basis for treatment. Nasal  washings and aspirates are unacceptable for Xpert Xpress SARS-CoV-2/FLU/RSV testing.  Fact Sheet for Patients: BloggerCourse.com  Fact Sheet for Healthcare Providers: SeriousBroker.it  This test is not yet approved or cleared by the [place] FDA and has been authorized for detection and/or diagnosis of SARS-CoV-2 by FDA under an Emergency Use Authorization (EUA). This EUA will remain in effect (meaning this test can be used) for the duration of the COVID-19 declaration under Section 564(b)(1) of the Act, 21 U.S.C. section 360bbb-3(b)(1), unless the authorization is terminated or revoked.     Resp Syncytial Virus by PCR NEGATIVE NEGATIVE Final    Comment: (NOTE) Fact Sheet for Patients: BloggerCourse.com  Fact Sheet for Healthcare Providers: SeriousBroker.it  This test is not yet approved or cleared by the [place] FDA and has been authorized for detection and/or diagnosis of SARS-CoV-2 by FDA under an Emergency Use Authorization (EUA). This EUA will remain in effect (meaning this test can be used) for the duration of the COVID-19 declaration under Section 564(b)(1) of the Act, 21 U.S.C. section 360bbb-3(b)(1), unless the authorization is terminated or revoked.  Performed at [facility], [address] [place], [place] [address]   Blood Culture (routine x 2)     Status: None (Preliminary result)   Collection Time: 09/14/2022 10:22 AM   Specimen: BLOOD  Result Value Ref Range Status   Specimen Description BLOOD BLOOD LEFT ARM  Final   Special Requests  Final    BOTTLES DRAWN AEROBIC AND ANAEROBIC Blood Culture adequate volume   Culture   Final    NO GROWTH 4 DAYS Performed at [facility], [address] [place], [place] [address]    Report Status PENDING  Incomplete  MRSA Next Gen by PCR, Nasal     Status: None   Collection Time: 10/02/2022   8:21 PM   Specimen: Nasal Mucosa; Nasal Swab  Result Value Ref Range Status   MRSA by PCR Next Gen NOT DETECTED NOT DETECTED Final    Comment: (NOTE) The GeneXpert MRSA Assay (FDA approved for NASAL specimens only), is one component of a comprehensive MRSA colonization surveillance program. It is not intended to diagnose MRSA infection nor to guide or monitor treatment for MRSA infections. Test performance is not FDA approved in patients less than 55 years old. Performed at [facility], [address] [place], [place] [address]     Procedures and diagnostic studies:  No results found.             LOS: 4 days   Manus Weedman  [facility]   Pager on [email or web address]. If 7PM-7AM, please contact night-coverage at [email or web address]     10/04/2022, 1:33 PM

## 2022-10-04 NOTE — Care Management Important Message (Signed)
Important Message  Patient Details  Name: Hannah [name] MRN: [id number] Date of Birth: 1940/10/27   Important Message Given:  Other (see comment)  Patient is on comfort care with possible plans to discharge to hospice home. Out of respect to the patient and family no Important Message from [facility] given.    Olegario Messier A Ladawna Walgren 10/04/2022, 9:21 AM

## 2022-10-04 NOTE — Plan of Care (Signed)
Problem: Education: Goal: Understanding of CV disease, CV risk reduction, and recovery process will improve Outcome: Progressing   Problem: Activity: Goal: Ability to return to baseline activity level will improve Outcome: Progressing   Problem: Cardiovascular: Goal: Ability to achieve and maintain adequate cardiovascular perfusion will improve Outcome: Progressing   Problem: Health Behavior/Discharge Planning: Goal: Ability to safely manage health-related needs after discharge will improve Outcome: Progressing   Problem: Education: Goal: Knowledge of disease or condition will improve Outcome: Progressing Goal: Knowledge of secondary prevention will improve (MUST DOCUMENT ALL) Outcome: Progressing Goal: Knowledge of patient specific risk factors will improve Loraine Leriche N/A or DELETE if not current risk factor) Outcome: Progressing   Problem: Ischemic Stroke/TIA Tissue Perfusion: Goal: Complications of ischemic stroke/TIA will be minimized Outcome: Progressing   Problem: Coping: Goal: Will verbalize positive feelings about self Outcome: Progressing Goal: Will identify appropriate support needs Outcome: Progressing   Problem: Health Behavior/Discharge Planning: Goal: Ability to manage health-related needs will improve Outcome: Progressing Goal: Goals will be collaboratively established with patient/family Outcome: Progressing   Problem: Self-Care: Goal: Ability to participate in self-care as condition permits will improve Outcome: Progressing Goal: Verbalization of feelings and concerns over difficulty with self-care will improve Outcome: Progressing Goal: Ability to communicate needs accurately will improve Outcome: Progressing   Problem: Nutrition: Goal: Risk of aspiration will decrease Outcome: Progressing Goal: Dietary intake will improve Outcome: Progressing   Problem: Education: Goal: Knowledge of General Education information will improve Description:  Including pain rating scale, medication(s)/side effects and non-pharmacologic comfort measures Outcome: Progressing   Problem: Health Behavior/Discharge Planning: Goal: Ability to manage health-related needs will improve Outcome: Progressing   Problem: Clinical Measurements: Goal: Ability to maintain clinical measurements within normal limits will improve Outcome: Progressing Goal: Will remain free from infection Outcome: Progressing Goal: Diagnostic test results will improve Outcome: Progressing Goal: Respiratory complications will improve Outcome: Progressing Goal: Cardiovascular complication will be avoided Outcome: Progressing   Problem: Activity: Goal: Risk for activity intolerance will decrease Outcome: Progressing   Problem: Nutrition: Goal: Adequate nutrition will be maintained Outcome: Progressing   Problem: Coping: Goal: Level of anxiety will decrease Outcome: Progressing   Problem: Elimination: Goal: Will not experience complications related to bowel motility Outcome: Progressing Goal: Will not experience complications related to urinary retention Outcome: Progressing   Problem: Pain Managment: Goal: General experience of comfort will improve Outcome: Progressing   Problem: Safety: Goal: Ability to remain free from injury will improve Outcome: Progressing   Problem: Skin Integrity: Goal: Risk for impaired skin integrity will decrease Outcome: Progressing   Problem: Education: Goal: Ability to describe self-care measures that may prevent or decrease complications (Diabetes Survival Skills Education) will improve Outcome: Progressing Goal: Individualized Educational Video(s) Outcome: Progressing   Problem: Coping: Goal: Ability to adjust to condition or change in health will improve Outcome: Progressing   Problem: Fluid Volume: Goal: Ability to maintain a balanced intake and output will improve Outcome: Progressing   Problem: Health  Behavior/Discharge Planning: Goal: Ability to identify and utilize available resources and services will improve Outcome: Progressing Goal: Ability to manage health-related needs will improve Outcome: Progressing   Problem: Metabolic: Goal: Ability to maintain appropriate glucose levels will improve Outcome: Progressing   Problem: Nutritional: Goal: Maintenance of adequate nutrition will improve Outcome: Progressing Goal: Progress toward achieving an optimal weight will improve Outcome: Progressing   Problem: Skin Integrity: Goal: Risk for impaired skin integrity will decrease Outcome: Progressing   Problem: Tissue Perfusion: Goal: Adequacy of tissue perfusion will improve  Outcome: Progressing   Problem: Education: Goal: Knowledge of the prescribed therapeutic regimen will improve Outcome: Progressing   Problem: Coping: Goal: Ability to identify and develop effective coping behavior will improve Outcome: Progressing
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
## 2022-10-04 NOTE — Progress Notes (Signed)
[facility] REFERRAL NOTE  Received request from Jonetta Speak, RN,Transitions of Care Manager, for hospice services. Spoke with patient's children, Alzada Brazee, Marrian Salvage and Juliette Mangle to initiate education related to hospice philosophy, services, and team approach to care. All endorse they are unable to provide care for patient at home and would be looking at care at [facility] or LTC.  After a long discussion of hospice services and explaining IPU criteria, they are seeking more information on patient's current medical status.  This RN requested follow up with family from Dr. Myriam Forehand and from Georgiann Cocker palliative care.  Patient/family verbalized understanding of information given and would like to speak for physicians prior to making final discharge disposition.  Above information shared with Jonetta Speak, SW, Transitions of Care Manager, Dr. Myriam Forehand and Georgiann Cocker NP.  Hospital Liaison Team will continue to follow through final disposition.  Please call with any hospice related questions or concerns. Thank you for the opportunity to participate in this patient's care.  Norris Cross, RN Nurse Liaison [phone]

## 2022-10-04 NOTE — Progress Notes (Signed)
Palliative Care Progress Note, Assessment & Plan   Patient Name: Hannah [name]       Date: 10/04/2022 DOB: 1940/10/01  Age: 82 y.o. MRN#: [id number] Attending Physician: Lurene Shadow, MD Primary Care Physician: [name], MD Admit Date: 2022/10/28  Subjective: Patient is lying in bed in no apparent distress.  She acknowledges my presence and is able to make her wishes known.  No family or friends present during my visit.  HPI: 82 y.o. female  with past medical history of PE, HFpEF, CKD stage 3a, T2DM, HTN, paroxsymal atrial fibrillation and hypothyroidism admitted from [facility] on 2022/10/28 with anemia found on routine blood draw (hemoglobin 6.8).   Noted recent month long stay at [facility] s/p laminectomy for C1/C2 fracture with associated central cord syndrome and small subarachnoid hemorrhage after suffering a fall at home.   CT cervical spine 1. Known recent C1 and C2 fractures with increased fracture displacement and angulation including new complete posterior dislocation of the lateral masses of C1 relative to C2. Resultant severe spinal stenosis at C1-2.   CT abdomen/pelvis 1. Enlargement and heterogeneity of the right iliopsoas muscles with associated fat stranding is suspicious for an intramuscular hematoma. 2. Small to moderate right pleural effusion with associated atelectasis. 3. Anasarca.   Palliative medicine was consulted for assisting with goals of care conversations.  Summary of counseling/coordination of care: Extensive chart review completed prior to meeting patient including labs, vital signs, imaging, progress notes, orders, and available advanced directive documents from current and previous encounters.   After reviewing the patient's chart and  assessing the patient at bedside, I spoke with patient in regards to symptom management and goals of care.  Symptoms assessed.  Patient denies pain or discomfort at this time.  We discussed that she had a calm and uneventful night.  No adjustment to [facility] needed at this time.  Also attempted to speak about plans once patient is discharged from hospital.  However, patient unable to engage in complex medical decision making.  When asked if she understands the plan for her today, she said she is concerned that somebody died at [facility] and she needs to be in touch with someone at [facility].  Multiple attempts made to redirect patient to her current situation.  However, patient was fixated on discussing an assisted living friend that she believes is passed away.  Therapeutic silence, active listening, and emotional support provided.  After meeting with the patient, I counseled with attending Dr. Myriam Forehand. As per attending, family is in agreement to speak with hospice agency to discuss hospice services. TOC made aware and consult for choice of hospice agencies placed by attending. Awaiting hospice evaluation.   Full comfort measures continue.   Physical Exam Vitals reviewed.  Constitutional:      General: She is not in acute distress.    Appearance: She is obese.  HENT:     Mouth/Throat:     Mouth: Mucous membranes are moist.  Eyes:     Pupils: Pupils are equal, round, and reactive to light.  Pulmonary:     Effort: Pulmonary effort is normal.  Abdominal:     Palpations: Abdomen is  soft.  Skin:    General: Skin is warm and dry.  Neurological:     Mental Status: She is alert.     Comments: Oriented to self  Psychiatric:        Mood and Affect: Mood normal.        Behavior: Behavior normal.             Total Time 25 minutes   Time spent includes: Detailed review of medical records (labs, imaging, vital signs), medically appropriate exam (mental status,  respiratory, cardiac, skin), discussed with treatment team, counseling and educating patient, family and staff, documenting clinical information, medication management and coordination of care.  Samara Deist L. Bonita Quin, DNP, FNP-BC Palliative Medicine Team

## 2022-10-04 DEATH — deceased

## 2022-10-05 DIAGNOSIS — S13101A Dislocation of unspecified cervical vertebrae, initial encounter: Secondary | ICD-10-CM | POA: Diagnosis not present

## 2022-10-05 DIAGNOSIS — S13101D Dislocation of unspecified cervical vertebrae, subsequent encounter: Secondary | ICD-10-CM | POA: Diagnosis not present

## 2022-10-05 DIAGNOSIS — D62 Acute posthemorrhagic anemia: Secondary | ICD-10-CM

## 2022-10-05 DIAGNOSIS — R578 Other shock: Secondary | ICD-10-CM | POA: Diagnosis not present

## 2022-10-05 LAB — BASIC METABOLIC PANEL
Anion gap: 13 (ref 5–15)
BUN: 25 mg/dL — ABNORMAL HIGH (ref 8–23)
CO2: 22 mmol/L (ref 22–32)
Calcium: 8.2 mg/dL — ABNORMAL LOW (ref 8.9–10.3)
Chloride: 101 mmol/L (ref 98–111)
Creatinine, Ser: 0.69 mg/dL (ref 0.44–1.00)
GFR, Estimated: >60 mL/min (ref >60)
Glucose, Bld: 98 mg/dL (ref 70–99)
Potassium: 4.6 mmol/L (ref 3.5–5.1)
Sodium: 136 mmol/L (ref 135–145)

## 2022-10-05 LAB — CULTURE, BLOOD (ROUTINE X 2)
Culture: NO GROWTH
Special Requests: ADEQUATE

## 2022-10-05 LAB — PROCALCITONIN: Procalcitonin: 0.28 ng/mL

## 2022-10-05 MED ORDER — FERROUS SULFATE 325 (65 FE) MG PO TABS
325.0000 mg | ORAL_TABLET | Freq: Every day | ORAL | Status: DC
  Administered 2022-10-06 – 2022-10-15 (×10): 325 mg via ORAL
  Filled 2022-10-05 (×10): qty 1

## 2022-10-05 MED ORDER — PANTOPRAZOLE SODIUM 40 MG PO TBEC
40.0000 mg | DELAYED_RELEASE_TABLET | Freq: Every day | ORAL | Status: DC
  Administered 2022-10-05: 40 mg via ORAL
  Filled 2022-10-05: qty 1

## 2022-10-05 MED ORDER — CHLORHEXIDINE GLUCONATE CLOTH 2 % EX PADS
6.0000 | MEDICATED_PAD | Freq: Every day | CUTANEOUS | Status: DC
  Administered 2022-10-05 – 2022-10-15 (×11): 6 via TOPICAL

## 2022-10-05 MED ORDER — DULOXETINE HCL 30 MG PO CPEP
30.0000 mg | ORAL_CAPSULE | Freq: Every day | ORAL | Status: DC
  Administered 2022-10-05 – 2022-10-15 (×11): 30 mg via ORAL
  Filled 2022-10-05 (×11): qty 1

## 2022-10-05 NOTE — Progress Notes (Signed)
Nutrition Brief Note  Chart reviewed. Pt now transitioning to comfort care.  No further nutrition interventions planned at this time.  Please re-consult as needed.   Levada Schilling, RD, LDN, CDCES Registered Dietitian III Certified Diabetes Care and Education Specialist Please refer to [facility] for RD and/or RD on-call/weekend/after hours pager

## 2022-10-05 NOTE — Evaluation (Signed)
Physical Therapy Evaluation Patient Details Name: Hannah [name] MRN: [id number] DOB: 12/16/1940 Today's Date: 10/05/2022  History of Present Illness  Pt. Is baseline nonverbal, diabetic, CHF, a-fib. Complicated patient with C1-C2 fracture following a fall and prolonged hospitalization at [facility], recently discharged to [facility] now admitted with multi-factorial shock and altered mental status. CT on admission reveals known recent C1 and C2 fractures with increased fracture  displacement and angulation including new complete posterior  dislocation of the lateral masses of C1 relative to C2. Resultant  severe spinal stenosis at C1-2. Per secure chat with Dr. Katrinka Blazing Neurosurgery states no further tx planned, ordering soft collar.  Clinical Impression   Patient received in supine in bed. She is alert, but has difficulty verbalizing. Answers yes/no questions. No family present at bedside. Patient was on comfort care, however that has been rescinded and patient and family now state they would like to see how she does. Patient is dependent with mobility at this time. She requires total assist to roll in bed and grimaces in pain with this. She has decreased sensation over all extremities and little to no strength in extremities. Patient will continue to benefit from a trial of PT to see if any progress can be achieved.          If plan is discharge home, recommend the following: Two people to help with walking and/or transfers;Two people to help with bathing/dressing/bathroom   Can travel by private vehicle   No    Equipment Recommendations None recommended by PT  Recommendations for Other Services       Functional Status Assessment Patient has had a recent decline in their functional status and demonstrates the ability to make significant improvements in function in a reasonable and predictable amount of time.     Precautions / Restrictions Precautions Precautions: Cervical Precaution Comments: C1-2  fracture and disclocation Required Braces or Orthoses: Cervical Brace Cervical Brace: Soft collar (to be ordered) Restrictions Weight Bearing Restrictions: No      Mobility  Bed Mobility Overal bed mobility: Needs Assistance Bed Mobility: Rolling Rolling: Total assist, +2 for physical assistance              Transfers                   General transfer comment: unable    Ambulation/Gait               General Gait Details: unable  Stairs            Wheelchair Mobility     Tilt Bed    Modified Rankin (Stroke Patients Only)       Balance                                             Pertinent Vitals/Pain Pain Assessment Pain Assessment: Faces Faces Pain Scale: Hurts even more Pain Location: Neck, arms with movement in bed Pain Descriptors / Indicators: Grimacing, Guarding Pain Intervention(s): Monitored during session, Repositioned    Home Living Family/patient expects to be discharged to:: Skilled nursing facility                        Prior Function Prior Level of Function : Needs assist             Mobility Comments: appears to have been bed bound since  fall about a month ago, was walking with walker prior to that ADLs Comments: needs A     Extremity/Trunk Assessment   Upper Extremity Assessment Upper Extremity Assessment: Defer to OT evaluation    Lower Extremity Assessment Lower Extremity Assessment: RLE deficits/detail;LLE deficits/detail RLE Deficits / Details: wiggles toes only RLE Sensation: decreased light touch RLE Coordination: decreased gross motor LLE Deficits / Details: wiggles toes only LLE Sensation: decreased light touch LLE Coordination: decreased gross motor    Cervical / Trunk Assessment Cervical / Trunk Assessment: Neck Surgery  Communication   Communication Communication: Difficulty communicating thoughts/reduced clarity of speech;Other (comment) (very quiet  whisper voice) Cueing Techniques: Verbal cues  Cognition Arousal: Alert Behavior During Therapy: Flat affect Overall Cognitive Status: No family/caregiver present to determine baseline cognitive functioning                                 General Comments: answers yes/ no        General Comments      Exercises     Assessment/Plan    PT Assessment Patient needs continued PT services  PT Problem List Decreased strength;Decreased activity tolerance;Impaired sensation;Decreased range of motion;Pain;Decreased mobility       PT Treatment Interventions Functional mobility training;Therapeutic activities;Therapeutic exercise;Patient/family education    PT Goals (Current goals can be found in the Care Plan section)  Acute Rehab PT Goals Patient Stated Goal: to go to rehab PT Goal Formulation: With patient/family Time For Goal Achievement: 10/19/22 Potential to Achieve Goals: Poor    Frequency Min 1X/week     Co-evaluation PT/OT/SLP Co-Evaluation/Treatment: Yes Reason for Co-Treatment: Necessary to address cognition/behavior during functional activity;For patient/therapist safety;To address functional/ADL transfers PT goals addressed during session: Mobility/safety with mobility         AM-PAC PT "6 Clicks" Mobility  Outcome Measure Help needed turning from your back to your side while in a flat bed without using bedrails?: Total Help needed moving from lying on your back to sitting on the side of a flat bed without using bedrails?: Total Help needed moving to and from a bed to a chair (including a wheelchair)?: Total Help needed standing up from a chair using your arms (e.g., wheelchair or bedside chair)?: Total Help needed to walk in hospital room?: Total Help needed climbing 3-5 steps with a railing? : Total 6 Click Score: 6    End of Session Equipment Utilized During Treatment: Cervical collar Activity Tolerance: Patient limited by pain;Patient limited  by fatigue Patient left: in bed;with call bell/phone within reach Nurse Communication: Mobility status PT Visit Diagnosis: Other abnormalities of gait and mobility (R26.89);Muscle weakness (generalized) (M62.81);Adult, failure to thrive (R62.7)    Time: 1610-9604 PT Time Calculation (min) (ACUTE ONLY): 12 min   Charges:   PT Evaluation $PT Eval Moderate Complexity: 1 Mod   PT General Charges $$ ACUTE PT VISIT: 1 Visit         Magnus Crescenzo, PT, GCS 10/05/22,3:24 PM

## 2022-10-05 NOTE — Evaluation (Signed)
Occupational Therapy Evaluation Patient Details Name: Hannah [name] MRN: [id number] DOB: 1940-03-22 Today's Date: 10/05/2022   History of Present Illness Pt. Is baseline nonverbal, diabetic, CHF, a-fib. Complicated patient with C1-C2 fracture following a fall and prolonged hospitalization at [facility], recently discharged to [facility] now admitted with multi-factorial shock and altered mental status. CT on admission reveals known recent C1 and C2 fractures with increased fracture  displacement and angulation including new complete posterior  dislocation of the lateral masses of C1 relative to C2. Resultant  severe spinal stenosis at C1-2. Per secure chat with Dr. Katrinka Blazing Neurosurgery states no further tx planned, ordering soft collar.   Clinical Impression   Patient presenting with decreased Ind in self care,balance, functional mobility/transfers, endurance, coordination, and sensation. Patient lives at home with family but recently fell ~ 1 month ago with C1/C2 fx. She was at [facility] and then returned to hospital and now has posterior dislocation. Dr. Katrinka Blazing wants soft collar donned for safety and plans for conservative treatment. Pt with little to no sensation in B UEs and unable to actively move and with PROM pt cries. Total A of 2 to roll in bed with pt unable to actively assist. She answers yes or no to questions. No family present during session.  Patient will benefit from acute OT to increase overall independence in the areas of ADLs, functional mobility, and safety awareness in order to safely discharge.        If plan is discharge home, recommend the following: Two people to help with walking and/or transfers;Two people to help with bathing/dressing/bathroom;Assistance with cooking/housework;Assist for transportation;Help with stairs or ramp for entrance;Direct supervision/assist for financial management;Direct supervision/assist for medications management    Functional Status Assessment  Patient has had a  recent decline in their functional status and demonstrates the ability to make significant improvements in function in a reasonable and predictable amount of time.  Equipment Recommendations  Other (comment) (defer to next venue of care)       Precautions / Restrictions Precautions Precautions: Cervical Precaution Comments: C1-2 fracture and disclocation Required Braces or Orthoses: Cervical Brace Cervical Brace: Soft collar Restrictions Weight Bearing Restrictions: No      Mobility Bed Mobility Overal bed mobility: Needs Assistance Bed Mobility: Rolling Rolling: Total assist, +2 for physical assistance              Transfers                   General transfer comment: unable secondary to pain          ADL either performed or assessed with clinical judgement   ADL Overall ADL's : Needs assistance/impaired                                       General ADL Comments: total A of 2 for all self care.     Vision Patient Visual Report: No change from baseline              Pertinent Vitals/Pain Pain Assessment Pain Assessment: Faces Faces Pain Scale: Hurts whole lot Pain Location: Neck, arms with movement in bed Pain Descriptors / Indicators: Grimacing, Guarding Pain Intervention(s): Monitored during session, Repositioned     Extremity/Trunk Assessment Upper Extremity Assessment Upper Extremity Assessment: RUE deficits/detail;LUE deficits/detail RUE Deficits / Details: Pt is unable to actively move and cries with PROM LUE Deficits / Details: pt reports not sensation  and unable to actively move and cries with PROM   Lower Extremity Assessment Lower Extremity Assessment: RLE deficits/detail;LLE deficits/detail RLE Deficits / Details: wiggles toes only RLE Sensation: decreased light touch RLE Coordination: decreased gross motor LLE Deficits / Details: wiggles toes only LLE Sensation: decreased light touch LLE Coordination: decreased  gross motor   Cervical / Trunk Assessment Cervical / Trunk Assessment: Neck Surgery   Communication Communication Communication: Difficulty communicating thoughts/reduced clarity of speech;Other (comment) Cueing Techniques: Verbal cues   Cognition Arousal: Alert Behavior During Therapy: Flat affect Overall Cognitive Status: No family/caregiver present to determine baseline cognitive functioning                                 General Comments: answers yes/ no                Home Living Family/patient expects to be discharged to:: Skilled nursing facility                                        Prior Functioning/Environment Prior Level of Function : Needs assist             Mobility Comments: appears to have been bed bound since fall about a month ago, was walking with walker prior to that ADLs Comments: needs A        OT Problem List: Decreased strength;Decreased coordination;Pain;Decreased range of motion;Decreased cognition;Impaired sensation;Increased edema;Decreased activity tolerance;Decreased safety awareness;Impaired balance (sitting and/or standing);Decreased knowledge of use of DME or AE;Decreased knowledge of precautions;Impaired UE functional use      OT Treatment/Interventions: Self-care/ADL training;Therapeutic exercise;Therapeutic activities;Neuromuscular education;Cognitive remediation/compensation;Energy conservation;DME and/or AE instruction;Patient/family education;Balance training;Manual therapy    OT Goals(Current goals can be found in the care plan section) Acute Rehab OT Goals Patient Stated Goal: "rehab" OT Goal Formulation: With patient Time For Goal Achievement: 10/19/22 Potential to Achieve Goals: Fair ADL Goals Pt Will Perform Grooming: bed level;with max assist Pt/caregiver will Perform Home Exercise Program: Increased ROM;Increased strength;Both right and left upper extremity;With written HEP provided  (caregiver providing instruction and assistance.)  OT Frequency: Min 1X/week    Co-evaluation PT/OT/SLP Co-Evaluation/Treatment: Yes Reason for Co-Treatment: Necessary to address cognition/behavior during functional activity;For patient/therapist safety;To address functional/ADL transfers PT goals addressed during session: Mobility/safety with mobility OT goals addressed during session: ADL's and self-care      AM-PAC OT "6 Clicks" Daily Activity     Outcome Measure Help from another person eating meals?: Total Help from another person taking care of personal grooming?: Total Help from another person toileting, which includes using toliet, bedpan, or urinal?: Total Help from another person bathing (including washing, rinsing, drying)?: Total Help from another person to put on and taking off regular upper body clothing?: Total Help from another person to put on and taking off regular lower body clothing?: Total 6 Click Score: 6   End of Session Equipment Utilized During Treatment: Cervical collar Nurse Communication: Mobility status;Other (comment) (MD reports nonsurgical and plans for soft collar)  Activity Tolerance: Patient limited by pain Patient left: in bed;with call bell/phone within reach;with bed alarm set  OT Visit Diagnosis: History of falling (Z91.81);Muscle weakness (generalized) (M62.81);Pain;Unsteadiness on feet (R26.81) Pain - part of body:  ("all over")                Time: 8657-8469 OT Time Calculation (  min): 18 min Charges:  OT General Charges $OT Visit: 1 Visit OT Evaluation $OT Eval High Complexity: 1 High  [address] [place], OTR/L , CBIS ascom [phone]  10/05/22, 4:28 PM

## 2022-10-05 NOTE — Progress Notes (Signed)
Palliative Care Progress Note, Assessment & Plan   Patient Name: Hannah [name]       Date: 10/05/2022 DOB: December 11, 1940  Age: 82 y.o. MRN#: [id number] Attending Physician: Tresa Moore, MD Primary Care Physician: [name], MD Admit Date: 09/28/2022  Subjective: Patient is lying in bed in no apparent distress.  She acknowledges my presence and is able to make her wishes known.  She has no acute complaints at this time.  We discussed her pain regimen.  She denies pain at this time.  No family or friends present during my visit.  HPI: 82 y.o. female  with past medical history of PE, HFpEF, CKD stage 3a, T2DM, HTN, paroxsymal atrial fibrillation and hypothyroidism admitted from [facility] on 09/05/2022 with anemia found on routine blood draw (hemoglobin 6.8).   Noted recent month long stay at [facility] s/p laminectomy for C1/C2 fracture with associated central cord syndrome and small subarachnoid hemorrhage after suffering a fall at home.   CT cervical spine 1. Known recent C1 and C2 fractures with increased fracture displacement and angulation including new complete posterior dislocation of the lateral masses of C1 relative to C2. Resultant severe spinal stenosis at C1-2.   CT abdomen/pelvis 1. Enlargement and heterogeneity of the right iliopsoas muscles with associated fat stranding is suspicious for an intramuscular hematoma. 2. Small to moderate right pleural effusion with associated atelectasis. 3. Anasarca.   Palliative medicine was consulted for assisting with goals of care conversations.  Summary of counseling/coordination of care: Extensive chart review completed prior to meeting patient including labs, vital signs, imaging, progress notes, orders, and available  advanced directive documents from current and previous encounters.   After reviewing the patient's chart and assessing the patient at bedside, I spoke with her son Sharlet Salina over the phone in regards to plan of care.  Sharlet Salina shares that he and family have decided to reverse comfort measures.  They would like for patient to have treatments to see what functional abilities she has.  He would like to have patient evaluated for PT and OT.  He is hopeful that patient can go to an SNF with hospice services to follow.  We discussed that treating the treatable is not in line with hospice philosophy.  Hospice philosophy focuses on symptoms and quality of life.  Sharlet Salina understood the difference between evaluation for SNF and hospice services.  He is opted for evaluation for SNF.  Discussed patient's overall poor functional status.  Outlined that patient may not be able to rehab given her severe cord injury.  Sharlet Salina shares his mother has been in dire circumstances before and is hopeful that she can pull through this particular situation as she has in the past.  Therapeutic silence, active listening, and emotional support provided.  I clarified with Sharlet Salina that he and family would like to reverse comfort measures and begin labwork, home medications, medical treatments where appropriate, and evaluation for PT/OT.  Sharlet Salina confirmed.  Above conveyed to [facility], attending, and dayshift RN.  Comfort measures removed from [facility].  Attending to add on appropriate orders to restart medical treatments/therapies again.   PMT will continue to follow and support patient throughout her hospitalization.  Physical  Exam Vitals reviewed.  Constitutional:      General: She is not in acute distress.    Appearance: She is obese.  HENT:     Head: Normocephalic.     Mouth/Throat:     Mouth: Mucous membranes are moist.  Eyes:     Pupils: Pupils are equal, round, and reactive to light.  Cardiovascular:     Pulses: Normal  pulses.  Pulmonary:     Effort: Pulmonary effort is normal.  Abdominal:     Palpations: Abdomen is soft.  Musculoskeletal:     Comments: Generalized weakness  Skin:    General: Skin is warm and dry.  Neurological:     Mental Status: She is alert.  Psychiatric:        Behavior: Behavior normal.        Judgment: Judgment normal.             Total Time 50 minutes   Time spent includes: Detailed review of medical records (labs, imaging, vital signs), medically appropriate exam (mental status, respiratory, cardiac, skin), discussed with treatment team, counseling and educating patient, family and staff, documenting clinical information, medication management and coordination of care.  Samara Deist L. Bonita Quin, DNP, FNP-BC Palliative Medicine Team

## 2022-10-05 NOTE — Progress Notes (Signed)
PROGRESS NOTE    Hannah [name]  JJO:[id number] DOB: 08-03-1940 DOA: October 26, 2022 PCP: [name], MD    Brief Narrative:  82 y.o. female with past medical history significant for recent C1/C2 fracture and central cord syndrome approximately 1 month ago following a fall at home status post laminectomy at [facility], PE, HFpEF, CKD Stage IIIa, DM Type 2, HTN, paroxsymal A.fib, Hypothyroidism. Of note, she was brought to the emergency department on 09/28/2022 because of bleeding from a PICC line on her right arm. She was brought from the nursing home to the emergency department because of hemoglobin of 6.8 on 10-26-22.   Of note, details of recent fracture are as follows: She had a fall on 8/21 with associated neck pain and bilateral weakness. She was admitted at [facility] where CT showed C1-C1 fracture and a small subarachnoid hemorrhage. MRI obtained showed C1 and C2 fracture, ossification of posterior longitudinal ligament, cervical spondylosis, cervical spinal cord injury, central cord syndrome.  Initial plan by neuro surgery at [facility] was for posterior cervical decompression, instrumentation and fusion from C1-C6 to be done on 8/28, but it was felt she would be best served by medically stabilizing and correcting coagulopathy prior surgery.  However on 8/25 family requested she be transferred to [facility] for Neurosurgical intervention there.     She was admitted to the ICU for hypovolemic/hemorrhagic/septic shock and severe anemia.  She was treated with IV fluids, empiric IV antibiotics and IV vasopressors.  She was also transfused with 2 units of packed red blood cells.  Workup also showed that she had new C1 and C2 dislocation on top of recent C1 and C2 fractures with resultant severe spinal stenosis at C1-C2.  Dr. Katrinka Blazing, neurosurgeon, was consulted to assist with management.  He discussed the case with neurosurgeon at [facility] who recommended nonsurgical approach.   Assessment &  Plan:   Principal Problem:   Hemorrhagic shock (HCC) Active Problems:   Pulmonary hypertension (HCC)   CKD stage 3a, GFR 45-59 ml/min (HCC)   CAD (coronary artery disease)   Paroxysmal atrial fibrillation (HCC)   Chronic diastolic CHF (congestive heart failure) (HCC)   Closed dislocation of cervical vertebra   Pressure injury of skin   AKI (acute kidney injury) (HCC)   Acute blood loss anemia   Hyperkalemia   Hyponatremia   Pleural effusion on right Hypovolemic/hemorrhagic/septic shock   Autonomic dysfunction in the setting of recent C1 and C2 spine fracture s/p laminectomy at [facility], now with worsening dislocation and significant spinal stenosis   Severe sepsis of unknown source   Acute blood loss anemia s/p transfusion with 2 units of PRBCs    Acute kidney injury on CKD stage IIIa   Acute metabolic encephalopathy   Type 2 diabetes mellitus   Right iliopsoas intramuscular hematoma   Chronic diastolic CHF   Pulmonary hypertension   Hyperkalemia   Hypoglycemia   Hyponatremia   Hypothyroidism   Small to moderate right pleural effusion without injury atelectasis  PLAN Case discussed with palliative care nurse practitioner..  Her discussion with patient's family they have elected to forego comfort measures.  Will discontinue comfort measures.  Engage PT and OT.  Check routine lab work.  Attempted to reach patient's family contact Sharlet Salina.  No answer, voicemail left.   DVT prophylaxis: SCDs Code Status: DNR Family Communication: Attempted to call son Sharlet Salina [phone].  No answer.  Voicemail left. Disposition Plan: Status is: Inpatient Remains inpatient appropriate because: C1-C2 fracture.  Will  need placement.   Level of care: Med-Surg  Consultants:  Palliative care  Procedures:  None  Antimicrobials: None   Subjective: Seen and examined.  Resting in bed.  Very weak.  Answers questions appropriately.  Weak voice.  Objective: Vitals:    10/03/22 0600 10/03/22 0637 10/03/22 0718 10/05/22 0017  BP:   (!) 131/94 (!) 154/76  Pulse:   67 70  Resp: 12 14 16    Temp: (!) 97.2 F (36.2 C) 97.7 F (36.5 C) 98.2 F (36.8 C) 97.7 F (36.5 C)  TempSrc:    Oral  SpO2:   100% 95%  Weight:        Intake/Output Summary (Last 24 hours) at 10/05/2022 1239 Last data filed at 10/04/2022 1700 Gross per 24 hour  Intake 0 ml  Output --  Net 0 ml   Filed Weights   09/13/2022 0839 09/19/2022 2030 10/01/22 0702  Weight: 90.9 kg 86.6 kg 91.2 kg    Examination:  General exam: NAD.  Appears very frail Respiratory system: Poor respiratory effort.  Scattered crackles.  Normal work of breathing.  Room air Cardiovascular system: S1-S2, RRR, no murmurs, no pedal edema Gastrointestinal system: Soft NT/ND, normal bowel sounds Central nervous system: Alert.  Oriented times person.  No focal deficits Extremities: Markedly decreased power in all 4 extremities Skin: No rashes, lesions or ulcers Psychiatry: Judgement and insight appear impaired. Mood & affect flattened.     Data Reviewed: I have personally reviewed following labs and imaging studies  CBC: Recent Labs  Lab 09/16/2022 0847 09/07/2022 2202 10/01/22 0354  WBC 27.5*  --  22.2*  NEUTROABS 22.1*  --   --   HGB 6.3* 7.7* 6.6*  HCT 21.4* 23.6* 21.6*  MCV 99.1  --  94.3  PLT 378  --  334   Basic Metabolic Panel: Recent Labs  Lab 10/01/2022 0847 09/12/2022 2202 10/01/22 0354 10/01/22 0355  NA 129* 132*  --  132*  K 5.8* 4.8  --  4.8  CL 92* 99  --  100  CO2 25 24  --  24  GLUCOSE 174* 144*  --  121*  BUN 62* 53*  --  50*  CREATININE 1.79* 1.39*  --  1.27*  CALCIUM 8.7* 8.2*  --  8.0*  MG  --   --  2.1  --   PHOS  --   --  3.0 3.1   GFR: Estimated Creatinine Clearance: 40.3 mL/min (A) (by C-G formula based on SCr of 1.27 mg/dL (H)). Liver Function Tests: Recent Labs  Lab 09/13/2022 0847 10/01/22 0355  AST 25  --   ALT 14  --   ALKPHOS 67  --   BILITOT 0.6  --   PROT  5.9*  --   ALBUMIN 2.4* 1.9*   No results for input(s): "LIPASE", "AMYLASE" in the last 168 hours. No results for input(s): "AMMONIA" in the last 168 hours. Coagulation Profile: Recent Labs  Lab 09/21/2022 1022  INR 1.3*   Cardiac Enzymes: No results for input(s): "CKTOTAL", "CKMB", "CKMBINDEX", "TROPONINI" in the last 168 hours. BNP (last 3 results) No results for input(s): "PROBNP" in the last 8760 hours. HbA1C: No results for input(s): "HGBA1C" in the last 72 hours. CBG: Recent Labs  Lab 10/02/22 0326 10/02/22 0734 10/02/22 0808 10/02/22 0811 10/02/22 1118  GLUCAP 71 64* 63* 81 99   Lipid Profile: No results for input(s): "CHOL", "HDL", "LDLCALC", "TRIG", "CHOLHDL", "LDLDIRECT" in the last 72 hours. Thyroid Function Tests: No results  for input(s): "TSH", "T4TOTAL", "FREET4", "T3FREE", "THYROIDAB" in the last 72 hours. Anemia Panel: No results for input(s): "VITAMINB12", "FOLATE", "FERRITIN", "TIBC", "IRON", "RETICCTPCT" in the last 72 hours. Sepsis Labs: Recent Labs  Lab 09/11/2022 1022 10/03/2022 2202  PROCALCITON  --  0.23  LATICACIDVEN 1.9 1.5    Recent Results (from the past 240 hour(s))  Resp panel by RT-PCR (RSV, Flu A&B, Covid) Anterior Nasal Swab     Status: None   Collection Time: 09/12/2022  8:47 AM   Specimen: Anterior Nasal Swab  Result Value Ref Range Status   SARS Coronavirus 2 by RT PCR NEGATIVE NEGATIVE Final    Comment: (NOTE) SARS-CoV-2 target nucleic acids are NOT DETECTED.  The SARS-CoV-2 RNA is generally detectable in upper respiratory specimens during the acute phase of infection. The lowest concentration of SARS-CoV-2 viral copies this assay can detect is 138 copies/mL. A negative result does not preclude SARS-Cov-2 infection and should not be used as the sole basis for treatment or other patient management decisions. A negative result may occur with  improper specimen collection/handling, submission of specimen other than nasopharyngeal  swab, presence of viral mutation(s) within the areas targeted by this assay, and inadequate number of viral copies(<138 copies/mL). A negative result must be combined with clinical observations, patient history, and epidemiological information. The expected result is Negative.  Fact Sheet for Patients:  BloggerCourse.com  Fact Sheet for Healthcare Providers:  SeriousBroker.it  This test is no t yet approved or cleared by the [place] FDA and  has been authorized for detection and/or diagnosis of SARS-CoV-2 by FDA under an Emergency Use Authorization (EUA). This EUA will remain  in effect (meaning this test can be used) for the duration of the COVID-19 declaration under Section 564(b)(1) of the Act, 21 U.S.C.section 360bbb-3(b)(1), unless the authorization is terminated  or revoked sooner.       Influenza A by PCR NEGATIVE NEGATIVE Final   Influenza B by PCR NEGATIVE NEGATIVE Final    Comment: (NOTE) The Xpert Xpress SARS-CoV-2/FLU/RSV plus assay is intended as an aid in the diagnosis of influenza from Nasopharyngeal swab specimens and should not be used as a sole basis for treatment. Nasal washings and aspirates are unacceptable for Xpert Xpress SARS-CoV-2/FLU/RSV testing.  Fact Sheet for Patients: BloggerCourse.com  Fact Sheet for Healthcare Providers: SeriousBroker.it  This test is not yet approved or cleared by the [place] FDA and has been authorized for detection and/or diagnosis of SARS-CoV-2 by FDA under an Emergency Use Authorization (EUA). This EUA will remain in effect (meaning this test can be used) for the duration of the COVID-19 declaration under Section 564(b)(1) of the Act, 21 U.S.C. section 360bbb-3(b)(1), unless the authorization is terminated or revoked.     Resp Syncytial Virus by PCR NEGATIVE NEGATIVE Final    Comment: (NOTE) Fact Sheet for  Patients: BloggerCourse.com  Fact Sheet for Healthcare Providers: SeriousBroker.it  This test is not yet approved or cleared by the [place] FDA and has been authorized for detection and/or diagnosis of SARS-CoV-2 by FDA under an Emergency Use Authorization (EUA). This EUA will remain in effect (meaning this test can be used) for the duration of the COVID-19 declaration under Section 564(b)(1) of the Act, 21 U.S.C. section 360bbb-3(b)(1), unless the authorization is terminated or revoked.  Performed at [facility], [address], [place], [place] [address]   Blood Culture (routine x 2)     Status: None   Collection Time: 09/09/2022 10:22 AM  Specimen: BLOOD  Result Value Ref Range Status   Specimen Description BLOOD BLOOD LEFT ARM  Final   Special Requests   Final    BOTTLES DRAWN AEROBIC AND ANAEROBIC Blood Culture adequate volume   Culture   Final    NO GROWTH 5 DAYS Performed at [facility], [address], [place], [place] [address]    Report Status 10/05/2022 FINAL  Final  MRSA Next Gen by PCR, Nasal     Status: None   Collection Time: 10/03/22  8:21 PM   Specimen: Nasal Mucosa; Nasal Swab  Result Value Ref Range Status   MRSA by PCR Next Gen NOT DETECTED NOT DETECTED Final    Comment: (NOTE) The GeneXpert MRSA Assay (FDA approved for NASAL specimens only), is one component of a comprehensive MRSA colonization surveillance program. It is not intended to diagnose MRSA infection nor to guide or monitor treatment for MRSA infections. Test performance is not FDA approved in patients less than 20 years old. Performed at [facility], [address], [place], [place] [address]          Radiology Studies: No results found.      Scheduled Meds:  DULoxetine  30 mg Oral Daily   [START ON 10/06/2022] ferrous sulfate  325 mg Oral Q breakfast   pantoprazole  40 mg Oral Daily    Continuous Infusions:   LOS: 5 days    Tresa Moore, MD [facility]   If 7PM-7AM, please contact night-coverage  10/05/2022, 12:39 PM

## 2022-10-05 NOTE — Progress Notes (Signed)
Patient ate only apple sauce for breakfast this morning. At lunch she ate 20% of her meal. Dinner she refused. MD is aware.

## 2022-10-05 NOTE — Evaluation (Addendum)
Clinical/Bedside Swallow Evaluation Patient Details  Name: Hannah [name] MRN: [id number] Date of Birth: 25-May-1940  Today's Date: 10/05/2022 Time: SLP Start Time (ACUTE ONLY): 1345 SLP Stop Time (ACUTE ONLY): 1420 SLP Time Calculation (min) (ACUTE ONLY): 35 min  Past Medical History:  Past Medical History:  Diagnosis Date   Acute massive pulmonary embolism (HCC) 07/13/2012   Massive PE w/ PEA arrest 07/13/12 >TNK >IVC filter >discharged on comadin     Anticoagulated on warfarin    Chronic diastolic CHF (congestive heart failure) (HCC) 03/15/2017   Chronic kidney disease, stage 3a (HCC) 12/15/2018   Diabetes mellitus, type 2 (HCC) 07/15/2012   with peripheral neuropathy   DM2 (diabetes mellitus, type 2) (HCC) 07/23/2012   HFrEF (heart failure with reduced ejection fraction) (HCC) 03/15/2017   Non-ischemic cardiomyopathy // Cardiac catheterization 01/01/21:  RCA prox to mid 20 // Echocardiogram 12/26/20:  EF 30-35, ant septum/apex, mid inf-sept AK; ant-lat/ant/inf HK, Gr 1 DD, mild LVH, normal RVSF, RVSP 27.2, small effusion (ant to RV), mild MR, mod TR, AV sclerosis w/o AS   Hyperlipemia 07/14/2012   Hyperlipidemia 02/25/2013   Hypertension 07/14/2012   Hypothyroid 06/06/2013   Large bowel stricture (HCC)    s/p colectomy in 2016   Neuropathy 02/12/2015   Osteoarthritis    s/p hip and knee replacements   PAF (paroxysmal atrial fibrillation) (HCC)    Pulmonary hypertension (HCC) 07/26/2016   Syncope 10/2018   Thyroid disease    Past Surgical History:  Past Surgical History:  Procedure Laterality Date   COLONOSCOPY N/A 03/12/2016   Procedure: COLONOSCOPY;  Surgeon: Dorena Cookey, MD;  Location: [facility] ENDOSCOPY;  Service: Endoscopy;  Laterality: N/A;   COLOSTOMY N/A 06/03/2014   Procedure: COLOSTOMY;  Surgeon: Harriette Bouillon, MD;  Location: [facility] OR;  Service: General;  Laterality: N/A;   FLEXIBLE SIGMOIDOSCOPY N/A 05/30/2014   Procedure: Arnell Sieving;  Surgeon: Jeani Hawking, MD;  Location: [facility]  ENDOSCOPY;  Service: Endoscopy;  Laterality: N/A;   INSERTION OF VENA CAVA FILTER N/A 07/16/2012   Procedure: INSERTION OF VENA CAVA FILTER;  Surgeon: [name], MD;  Location: [facility] CATH LAB;  Service: Cardiovascular;  Laterality: N/A;   KNEE ARTHROSCOPY     LEFT HEART CATH AND CORONARY ANGIOGRAPHY N/A 01/01/2021   Procedure: LEFT HEART CATH AND CORONARY ANGIOGRAPHY;  Surgeon: Kathleene Hazel, MD;  Location: [facility] INVASIVE CV LAB;  Service: Cardiovascular;  Laterality: N/A;   LEFT HEART CATH AND CORONARY ANGIOGRAPHY N/A 04/29/2022   Procedure: LEFT HEART CATH AND CORONARY ANGIOGRAPHY;  Surgeon: Orbie Pyo, MD;  Location: [facility] INVASIVE CV LAB;  Service: Cardiovascular;  Laterality: N/A;   PARTIAL COLECTOMY N/A 06/03/2014   Procedure: PARTIAL COLECTOMY;  Surgeon: Harriette Bouillon, MD;  Location: [facility] OR;  Service: General;  Laterality: N/A;   REDUCTION MAMMAPLASTY Bilateral    RIGHT HEART CATH N/A 12/30/2020   Procedure: RIGHT HEART CATH;  Surgeon: Kathleene Hazel, MD;  Location: [facility] INVASIVE CV LAB;  Service: Cardiovascular;  Laterality: N/A;   SP ARTHRO HIP*L*     HPI:  Hannah [name] with past medical history significant for recent C1/C2 fracture and central cord syndrome approximately 1 month ago following a fall at home status post laminectomy at [facility], PE, HFpEF, CKD Stage IIIa, DM Type 2, HTN, paroxsymal A.fib, Hypothyroidism. Of note, Hannah [name] was brought to the emergency department on 09/28/2022 because of bleeding from a PICC line on her right arm. Hannah [name] was brought from the nursing home to the emergency department  because of hemoglobin of 6.8 on 05-Oct-2022.      Of note, details of recent fracture are as follows: Hannah [name] had a fall on 8/21 with associated neck pain and bilateral weakness. Hannah [name] was admitted at [facility] where CT showed C1-C1 fracture and a small subarachnoid hemorrhage. MRI obtained showed C1 and C2 fracture, ossification of posterior longitudinal ligament, cervical  spondylosis, cervical spinal cord injury, central cord syndrome.  Initial plan by neuro surgery at [facility] was for posterior cervical decompression, instrumentation and fusion from C1-C6 to be done on 8/28, but it was felt Hannah [name] would be best served by medically stabilizing and correcting coagulopathy prior surgery.  However on 8/25 family requested Hannah [name] be transferred to [facility] for Neurosurgical intervention there.        Hannah [name] was admitted to the ICU for hypovolemic/hemorrhagic/septic shock and severe anemia.  Hannah [name] was treated with IV fluids, empiric IV antibiotics and IV vasopressors.  Hannah [name] was also transfused with 2 units of packed red blood cells.  Workup also showed that Hannah [name] had new C1 and C2 dislocation on top of recent C1 and C2 fractures with resultant severe spinal stenosis at C1-C2.  Dr. Katrinka Blazing, neurosurgeon, was consulted to assist with management.  He discussed the case with neurosurgeon at [facility] who recommended nonsurgical approach.    Initially per chart notes, pt may have been moving towards Comfort Care status; pt's Family has elected to forego Comfort measures.      CXR at this admit: Low lung volumes, otherwise no acute cardiopulmonary abnormality.    Assessment / Plan / Recommendation  Clinical Impression   Pt seen for BSE today. Pt awake, minimally engaged w/ basic Y/Ns but unsure of full accuracy of answers d/t Baseline Cognitive communication decline per chart notes.  On RA, afebrile.   Pt appears to present w/ grossly functional oropharyngeal phase swallowing w/ a modified (dysphagia) diet consistency w/ No overt oropharyngeal phase dysphagia noted w/ trials given, No overt sensorimotor deficits noted. Pt consumed po trials given w/ No immediate, overt clinical s/s of aspiration.  Pt appears at increased risk for aspiration/aspiration pneumonia in current setting of illness/hospitalization and comorbidities including a C1/C2 fracture and central cord syndrome. Pt also has  Baseline Cognitive communication decline/deficits per chart. This risk can be reduced by following general aspiration precautions using a modified dysphagia diet as recommended w/ 100% Supervision and feeding support.   During po trials of thin liquids via straw and purees fed to her, pt consumed the consistencies w/ no overt coughing, decline in vocal quality, or change in respiratory presentation during/post trials. O2 sats remained 98%. Oral phase appeared grossly [facility] w/ timely bolus management and control of bolus propulsion for A-P transfer for swallowing. Oral clearing achieved w/ all trial consistencies -- moistened foods given.  OM Exam was cursory d/t limited engagement but appeared [facility] for bolus management and oral clearing w/ no unilateral weakness noted. Hannah [name] required FULL feeding support.   Recommend a Dysphagia level 1 (puree) consistency diet w/ well-moistened foods; Thin liquids -- carefully monitor straw use, and pt should help to Hold Cup when drinking. Engage pt in task. Recommend general aspiration precautions, 100% supervision and feeding support. Feed Slowly using small bites/sips. Pills CRUSHED in Puree for safer, easier swallowing.  Education provided to Staff; updated MD/NSG. Precautions posted in chart/room. NSG to reconsult if any new needs arise. MD updated, agreed. Recommend Dietician and Palliative Care f/u for support.   This diet consistency w/ precautions  could best support pt in current setting of comorbidities. When pt has discharged to next venue of care and has improved medically(C1/C2 fracture), then ST services could be consulted for assessment of diet(food) upgrade if indicated. No further Acute ST services indicated at this time.  SLP Visit Diagnosis: Dysphagia, unspecified (R13.10) (in setting of current illness/hospitalization; Baseline comorbidities)    Aspiration Risk  Mild aspiration risk;Risk for inadequate nutrition/hydration    Diet Recommendation    Thin;Dysphagia 1 (puree) = a Dysphagia level 1 (puree) consistency diet w/ well-moistened foods; Thin liquids -- carefully monitor straw use, and pt should help to Hold Cup when drinking. Engages pt in task. Recommend general aspiration precautions, 100% supervision and feeding support. Feed Slowly using small bites/sips.   Medication Administration: Crushed with puree    Other  Recommendations Recommended Consults:  (Dietician) Oral Care Recommendations: Oral care BID;Oral care before and after PO;Staff/trained caregiver to provide oral care    Recommendations for follow up therapy are one component of a multi-disciplinary discharge planning process, led by the attending physician.  Recommendations may be updated based on patient status, additional functional criteria and insurance authorization.  Follow up Recommendations Follow physician's recommendations for discharge plan and follow up therapies (at next venue of care)      Assistance Recommended at Discharge  FULL  Functional Status Assessment Patient has had a recent decline in their functional status and/or demonstrates limited ability to make significant improvements in function in a reasonable and predictable amount of time  Frequency and Duration  (n/a)   (n/a)       Prognosis Prognosis for improved oropharyngeal function: Fair (w/ current rec'd diet) Barriers to Reach Goals: Cognitive deficits;Time post onset;Severity of deficits;Language deficits Barriers/Prognosis Comment: C1/C2 fracture and central cord syndrome      Swallow Study   General Date of Onset: 24-Oct-2022 HPI: Hannah [name] with past medical history significant for recent C1/C2 fracture and central cord syndrome approximately 1 month ago following a fall at home status post laminectomy at [facility], PE, HFpEF, CKD Stage IIIa, DM Type 2, HTN, paroxsymal A.fib, Hypothyroidism. Of note, Hannah [name] was brought to the emergency department on 09/28/2022 because of bleeding from a  PICC line on her right arm. Hannah [name] was brought from the nursing home to the emergency department because of hemoglobin of 6.8 on 24-Oct-2022.      Of note, details of recent fracture are as follows: Hannah [name] had a fall on 8/21 with associated neck pain and bilateral weakness. Hannah [name] was admitted at [facility] where CT showed C1-C1 fracture and a small subarachnoid hemorrhage. MRI obtained showed C1 and C2 fracture, ossification of posterior longitudinal ligament, cervical spondylosis, cervical spinal cord injury, central cord syndrome.  Initial plan by neuro surgery at [facility] was for posterior cervical decompression, instrumentation and fusion from C1-C6 to be done on 8/28, but it was felt Hannah [name] would be best served by medically stabilizing and correcting coagulopathy prior surgery.  However on 8/25 family requested Hannah [name] be transferred to [facility] for Neurosurgical intervention there.        Hannah [name] was admitted to the ICU for hypovolemic/hemorrhagic/septic shock and severe anemia.  Hannah [name] was treated with IV fluids, empiric IV antibiotics and IV vasopressors.  Hannah [name] was also transfused with 2 units of packed red blood cells.  Workup also showed that Hannah [name] had new C1 and C2 dislocation on top of recent C1 and C2 fractures with resultant severe spinal stenosis at C1-C2.  Dr. Katrinka Blazing, neurosurgeon, was consulted  to assist with management.  He discussed the case with neurosurgeon at [facility] who recommended nonsurgical approach.    Initially per chart notes, pt may have been moving towards Comfort Care status; pt's Family has elected to forego Comfort measures.      CXR at this admit: Low lung volumes, otherwise no acute cardiopulmonary abnormality. Type of Study: Bedside Swallow Evaluation Previous Swallow Assessment: 08/2022 - clears Diet Prior to this Study: Dysphagia 1 (pureed);Thin liquids (Level 0) Temperature Spikes Noted: No (wbc trending down) Respiratory Status: Room air History of Recent Intubation:  No Behavior/Cognition: Alert;Cooperative;Pleasant mood;Confused;Distractible;Requires cueing (mostly nonverbal; baseline Cognitive decline per chart) Oral Cavity Assessment: Within Functional Limits Oral Care Completed by SLP: Yes Oral Cavity - Dentition: Missing dentition Vision:  (n/a) Self-Feeding Abilities: Total assist (did not attempt to move hands for self-feeding) Patient Positioning: Upright in bed (full positioning - did not have to move head/neck) Baseline Vocal Quality:  (nonverbal) Volitional Cough: Cognitively unable to elicit Volitional Swallow: Unable to elicit    Oral/Motor/Sensory Function Overall Oral Motor/Sensory Function:  (w/ licking lips and extending tongue; bolus clearing)   Ice Chips Ice chips: Within functional limits Presentation: Spoon (fed;3 trials)   Thin Liquid Thin Liquid: Within functional limits Presentation: Straw (10 trials)    Nectar Thick Nectar Thick Liquid: Not tested   Honey Thick Honey Thick Liquid: Not tested   Puree Puree: Within functional limits Presentation: Spoon (fed; 9 trials)   Solid     Solid: Not tested      Jerilynn Som, MS, CCC-SLP Speech Language Pathologist Rehab Services; Operating Room Services -  [phone] (ascom) Elicia Lui 10/05/2022,4:54 PM

## 2022-10-05 NOTE — TOC Progression Note (Signed)
Transition of Care [facility]) - Progression Note    Patient Details  Name: Hannah [name] MRN: [id number] Date of Birth: 05/03/1940  Transition of Care [facility]) CM/SW Contact  Garret Reddish, RN Phone Number: 10/05/2022, 3:25 PM  Clinical Narrative:     Chart reviewed.  I have spoken with patient's daughter Elon Jester and her son Sharlet Salina.    Sharlet Salina explains that his mother was recently discharged from [facility]  with recent  C 1/C 2 fracture and central cord syndrome due to a fall at home approximately 1 month ago.  Noted that patient is status post laminectomy at [facility].  Sharlet Salina reported that patient was discharged to [facility] to [facility] to receive skilled nursing care for fractures. Sharlet Salina also explains that his mother has been at the facility for less then 48 hours and he got a call stating his mother's HD catheter was bleeding and she had to be sent to the ED.  Sharlet Salina reported that his mother was not on dialysis and did not have a dialysis catheter.  Sharlet Salina then reports that he received another call from [employer] stating that his mother had to be taken to the ED extremely low hemoglobin.  He reports that these series of events had taken place all when 96 hours of his mother being transported to [facility].    Sharlet Salina informed me that he and his family was able to speak with Hospice Coordinator on yesterday.  He reports that he has decided to remove patient from comfort care at this time. He would like for his mother to go to [facility] on discharge.  He informs me that he has spoken to Admission Coordinator at [facility] who reports that the hospital would need to start the process.    I explained to Sharlet Salina and Elon Jester that provider has ordered PT/OT/SlP therapies. Once all therapies have evaluated patient I would submit the therapy evaluation and admission records to the insurance company and they would determine if  patient meet criteria for Skilled Nursing Care.    I have also voiced my concerns that patient may not be able to participate in therapy due to her medical condition.  I have explained to Sharlet Salina and Elon Jester that Medicaid would pay for long-term care.  I did informed Sharlet Salina and Elon Jester that at the facility Palliative Care can follow while at the facility.    Sharlet Salina reported that patient has applied for Medicaid before and did not qualify.  I informed Sharlet Salina that in the event that patient does not meet criteria for SNF I can ask the financial counselor to see if the patient would qualify for Medicaid under the Medicaid expansion.    I did inform Sharlet Salina and Elon Jester that Oleh Genin Sifford from [facility] had been assigned to Mrs. Petkus's case and would see patient today.    TOC will continue to follow for discharge planning.       Expected Discharge Plan and Services                                               Social Determinants of Health (SDOH) Interventions SDOH Screenings   Food Insecurity: No Food Insecurity (09/15/2022)   Received from [facility]  Housing: Low Risk  (05/20/2022)  Transportation Needs: No Transportation Needs (09/15/2022)   Received from [facility]  Utilities: Not At Risk (09/15/2022)   Received from [facility]  Alcohol Screen: Low Risk  (04/29/2022)  Depression (PHQ2-9): Low Risk  (09/13/2021)  Financial Resource Strain: Low Risk  (09/15/2022)   Received from [facility]  Physical Activity: Insufficiently Active (06/24/2021)  Social Connections: Moderately Integrated (06/24/2021)  Stress: No Stress Concern Present (06/24/2021)  Tobacco Use: Medium Risk (09/28/2022)    Readmission Risk Interventions    01/04/2021    1:14 PM  Readmission Risk Prevention Plan  Transportation Screening Complete  PCP or Specialist Appt within 5-7 Days --  Home Care Screening Complete  Medication  Review (RN CM) Complete

## 2022-10-05 NOTE — Progress Notes (Signed)
[facility] will follow peripherally until a discharge disposition has been determined .    Please don't hesitate to call with any Hospice related questions or concerns.    Thank you for the opportunity to participate in this patient's care. [facility] Liaison [phone]

## 2022-10-06 DIAGNOSIS — R578 Other shock: Secondary | ICD-10-CM | POA: Diagnosis not present

## 2022-10-06 MED ORDER — PANTOPRAZOLE SODIUM 40 MG IV SOLR
40.0000 mg | INTRAVENOUS | Status: DC
  Administered 2022-10-06 – 2022-10-15 (×10): 40 mg via INTRAVENOUS
  Filled 2022-10-06 (×10): qty 10

## 2022-10-06 NOTE — Progress Notes (Signed)
PROGRESS NOTE    Hannah [name]  MVH:[id number] DOB: 05-07-1940 DOA: 09/11/2022 PCP: [name], MD    Brief Narrative:  82 y.o. female with past medical history significant for recent C1/C2 fracture and central cord syndrome approximately 1 month ago following a fall at home status post laminectomy at [facility], PE, HFpEF, CKD Stage IIIa, DM Type 2, HTN, paroxsymal A.fib, Hypothyroidism. Of note, she was brought to the emergency department on 09/28/2022 because of bleeding from a PICC line on her right arm. She was brought from the nursing home to the emergency department because of hemoglobin of 6.8 on 09/13/2022.   Of note, details of recent fracture are as follows: She had a fall on 8/21 with associated neck pain and bilateral weakness. She was admitted at [facility] where CT showed C1-C1 fracture and a small subarachnoid hemorrhage. MRI obtained showed C1 and C2 fracture, ossification of posterior longitudinal ligament, cervical spondylosis, cervical spinal cord injury, central cord syndrome.  Initial plan by neuro surgery at [facility] was for posterior cervical decompression, instrumentation and fusion from C1-C6 to be done on 8/28, but it was felt she would be best served by medically stabilizing and correcting coagulopathy prior surgery.  However on 8/25 family requested she be transferred to [facility] for Neurosurgical intervention there.     She was admitted to the ICU for hypovolemic/hemorrhagic/septic shock and severe anemia.  She was treated with IV fluids, empiric IV antibiotics and IV vasopressors.  She was also transfused with 2 units of packed red blood cells.  Workup also showed that she had new C1 and C2 dislocation on top of recent C1 and C2 fractures with resultant severe spinal stenosis at C1-C2.  Dr. Katrinka Blazing, neurosurgeon, was consulted to assist with management.  He discussed the case with neurosurgeon at [facility] who recommended nonsurgical approach.   Assessment &  Plan:   Principal Problem:   Hemorrhagic shock (HCC) Active Problems:   Pulmonary hypertension (HCC)   CKD stage 3a, GFR 45-59 ml/min (HCC)   CAD (coronary artery disease)   Paroxysmal atrial fibrillation (HCC)   Chronic diastolic CHF (congestive heart failure) (HCC)   Closed dislocation of cervical vertebra   Pressure injury of skin   AKI (acute kidney injury) (HCC)   Acute blood loss anemia   Hyperkalemia   Hyponatremia   Pleural effusion on right Hypovolemic/hemorrhagic/septic shock   Autonomic dysfunction in the setting of recent C1 and C2 spine fracture s/p laminectomy at [facility], now with worsening dislocation and significant spinal stenosis   Severe sepsis of unknown source   Acute blood loss anemia s/p transfusion with 2 units of PRBCs    Acute kidney injury on CKD stage IIIa   Acute metabolic encephalopathy   Type 2 diabetes mellitus   Right iliopsoas intramuscular hematoma   Chronic diastolic CHF   Pulmonary hypertension   Hyperkalemia   Hypoglycemia   Hyponatremia   Hypothyroidism   Small to moderate right pleural effusion without injury atelectasis  PLAN Case discussed with palliative care nurse practitioner..  Her discussion with patient's family they have elected to forego comfort measures. Work as tolerated with PT/OT.  Cervical collar placed.  Unfortunately we are unable to draw labs at this time.  TOC consulted, bed search initiated for SNF.   DVT prophylaxis: SCDs Code Status: DNR Family Communication: Attempted to call son Sharlet Salina [phone].  No answer.  Voicemail left. Disposition Plan: Status is: Inpatient Remains inpatient appropriate because: C1-C2 fracture.  Will  need placement.   Level of care: Med-Surg  Consultants:  Palliative care  Procedures:  None  Antimicrobials: None   Subjective: Seen and examined.  Resting in bed, appears weak and deconditioned.  Objective: Vitals:   10/05/22 0017 10/05/22 1649  10/05/22 2010 10/06/22 0554  BP: (!) 154/76 (!) 166/94 135/74 (!) 96/53  Pulse: 70 (!) 101 91 81  Resp:  19    Temp: 97.7 F (36.5 C) 98.4 F (36.9 C) 97.6 F (36.4 C) (!) 97.5 F (36.4 C)  TempSrc: Oral  Oral Oral  SpO2: 95%  100% 100%  Weight:        Intake/Output Summary (Last 24 hours) at 10/06/2022 1608 Last data filed at 10/06/2022 0900 Gross per 24 hour  Intake 360 ml  Output 1300 ml  Net -940 ml   Filed Weights   09/04/2022 0839 09/17/2022 2030 10/01/22 0702  Weight: 90.9 kg 86.6 kg 91.2 kg    Examination:  General exam: NAD.  Frail appearing Respiratory system: Poor respiratory effort.  Scattered crackles.  Normal work of breathing.  Room air Cardiovascular system: S1-S2, RRR, no murmurs, no pedal edema Gastrointestinal system: Soft NT/ND, normal bowel sounds Central nervous system: Alert.  Oriented times person.  No focal deficits Extremities: Markedly decreased power in all 4 extremities 0x5 Skin: No rashes, lesions or ulcers Psychiatry: Judgement and insight appear impaired. Mood & affect flattened.     Data Reviewed: I have personally reviewed following labs and imaging studies  CBC: Recent Labs  Lab 09/13/2022 0847 09/27/2022 2202 10/01/22 0354  WBC 27.5*  --  22.2*  NEUTROABS 22.1*  --   --   HGB 6.3* 7.7* 6.6*  HCT 21.4* 23.6* 21.6*  MCV 99.1  --  94.3  PLT 378  --  334   Basic Metabolic Panel: Recent Labs  Lab 09/25/2022 0847 09/14/2022 2202 10/01/22 0354 10/01/22 0355 10/05/22 1344  NA 129* 132*  --  132* 136  K 5.8* 4.8  --  4.8 4.6  CL 92* 99  --  100 101  CO2 25 24  --  24 22  GLUCOSE 174* 144*  --  121* 98  BUN 62* 53*  --  50* 25*  CREATININE 1.79* 1.39*  --  1.27* 0.69  CALCIUM 8.7* 8.2*  --  8.0* 8.2*  MG  --   --  2.1  --   --   PHOS  --   --  3.0 3.1  --    GFR: Estimated Creatinine Clearance: 63.9 mL/min (by C-G formula based on SCr of 0.69 mg/dL). Liver Function Tests: Recent Labs  Lab 09/21/2022 0847 10/01/22 0355  AST 25   --   ALT 14  --   ALKPHOS 67  --   BILITOT 0.6  --   PROT 5.9*  --   ALBUMIN 2.4* 1.9*   No results for input(s): "LIPASE", "AMYLASE" in the last 168 hours. No results for input(s): "AMMONIA" in the last 168 hours. Coagulation Profile: Recent Labs  Lab 09/18/2022 1022  INR 1.3*   Cardiac Enzymes: No results for input(s): "CKTOTAL", "CKMB", "CKMBINDEX", "TROPONINI" in the last 168 hours. BNP (last 3 results) No results for input(s): "PROBNP" in the last 8760 hours. HbA1C: No results for input(s): "HGBA1C" in the last 72 hours. CBG: Recent Labs  Lab 10/02/22 0326 10/02/22 0734 10/02/22 0808 10/02/22 0811 10/02/22 1118  GLUCAP 71 64* 63* 81 99   Lipid Profile: No results for input(s): "CHOL", "HDL", "LDLCALC", "TRIG", "CHOLHDL", "  LDLDIRECT" in the last 72 hours. Thyroid Function Tests: No results for input(s): "TSH", "T4TOTAL", "FREET4", "T3FREE", "THYROIDAB" in the last 72 hours. Anemia Panel: No results for input(s): "VITAMINB12", "FOLATE", "FERRITIN", "TIBC", "IRON", "RETICCTPCT" in the last 72 hours. Sepsis Labs: Recent Labs  Lab October 25, 2022 1022 Oct 25, 2022 2202 10/05/22 1344  PROCALCITON  --  0.23 0.28  LATICACIDVEN 1.9 1.5  --     Recent Results (from the past 240 hour(s))  Resp panel by RT-PCR (RSV, Flu A&B, Covid) Anterior Nasal Swab     Status: None   Collection Time: 2022/10/25  8:47 AM   Specimen: Anterior Nasal Swab  Result Value Ref Range Status   SARS Coronavirus 2 by RT PCR NEGATIVE NEGATIVE Final    Comment: (NOTE) SARS-CoV-2 target nucleic acids are NOT DETECTED.  The SARS-CoV-2 RNA is generally detectable in upper respiratory specimens during the acute phase of infection. The lowest concentration of SARS-CoV-2 viral copies this assay can detect is 138 copies/mL. A negative result does not preclude SARS-Cov-2 infection and should not be used as the sole basis for treatment or other patient management decisions. A negative result may occur with   improper specimen collection/handling, submission of specimen other than nasopharyngeal swab, presence of viral mutation(s) within the areas targeted by this assay, and inadequate number of viral copies(<138 copies/mL). A negative result must be combined with clinical observations, patient history, and epidemiological information. The expected result is Negative.  Fact Sheet for Patients:  BloggerCourse.com  Fact Sheet for Healthcare Providers:  SeriousBroker.it  This test is no t yet approved or cleared by the [place] FDA and  has been authorized for detection and/or diagnosis of SARS-CoV-2 by FDA under an Emergency Use Authorization (EUA). This EUA will remain  in effect (meaning this test can be used) for the duration of the COVID-19 declaration under Section 564(b)(1) of the Act, 21 U.S.C.section 360bbb-3(b)(1), unless the authorization is terminated  or revoked sooner.       Influenza A by PCR NEGATIVE NEGATIVE Final   Influenza B by PCR NEGATIVE NEGATIVE Final    Comment: (NOTE) The Xpert Xpress SARS-CoV-2/FLU/RSV plus assay is intended as an aid in the diagnosis of influenza from Nasopharyngeal swab specimens and should not be used as a sole basis for treatment. Nasal washings and aspirates are unacceptable for Xpert Xpress SARS-CoV-2/FLU/RSV testing.  Fact Sheet for Patients: BloggerCourse.com  Fact Sheet for Healthcare Providers: SeriousBroker.it  This test is not yet approved or cleared by the [place] FDA and has been authorized for detection and/or diagnosis of SARS-CoV-2 by FDA under an Emergency Use Authorization (EUA). This EUA will remain in effect (meaning this test can be used) for the duration of the COVID-19 declaration under Section 564(b)(1) of the Act, 21 U.S.C. section 360bbb-3(b)(1), unless the authorization is terminated or revoked.      Resp Syncytial Virus by PCR NEGATIVE NEGATIVE Final    Comment: (NOTE) Fact Sheet for Patients: BloggerCourse.com  Fact Sheet for Healthcare Providers: SeriousBroker.it  This test is not yet approved or cleared by the [place] FDA and has been authorized for detection and/or diagnosis of SARS-CoV-2 by FDA under an Emergency Use Authorization (EUA). This EUA will remain in effect (meaning this test can be used) for the duration of the COVID-19 declaration under Section 564(b)(1) of the Act, 21 U.S.C. section 360bbb-3(b)(1), unless the authorization is terminated or revoked.  Performed at [facility], [address] [place], [place] [address]   Blood Culture (routine  x 2)     Status: None   Collection Time: 10-23-2022 10:22 AM   Specimen: BLOOD  Result Value Ref Range Status   Specimen Description BLOOD BLOOD LEFT ARM  Final   Special Requests   Final    BOTTLES DRAWN AEROBIC AND ANAEROBIC Blood Culture adequate volume   Culture   Final    NO GROWTH 5 DAYS Performed at [facility], [address], [place], [place] [address]    Report Status 10/05/2022 FINAL  Final  MRSA Next Gen by PCR, Nasal     Status: None   Collection Time: 2022/10/23  8:21 PM   Specimen: Nasal Mucosa; Nasal Swab  Result Value Ref Range Status   MRSA by PCR Next Gen NOT DETECTED NOT DETECTED Final    Comment: (NOTE) The GeneXpert MRSA Assay (FDA approved for NASAL specimens only), is one component of a comprehensive MRSA colonization surveillance program. It is not intended to diagnose MRSA infection nor to guide or monitor treatment for MRSA infections. Test performance is not FDA approved in patients less than 80 years old. Performed at [facility], [address], [place], [place] [address]          Radiology Studies: No results found.      Scheduled Meds:  Chlorhexidine Gluconate Cloth  6 each  Topical Daily   DULoxetine  30 mg Oral Daily   ferrous sulfate  325 mg Oral Q breakfast   pantoprazole (PROTONIX) IV  40 mg Intravenous Q24H   Continuous Infusions:   LOS: 6 days    Tresa Moore, MD [facility]   If 7PM-7AM, please contact night-coverage  10/06/2022, 4:08 PM

## 2022-10-06 NOTE — Progress Notes (Signed)
Palliative Care Progress Note   Patient Name: Hannah [name]       Date: 10/06/2022 DOB: May 03, 1940  Age: 82 y.o. MRN#: [id number] Attending Physician: Tresa Moore, MD Primary Care Physician: [name], MD Admit Date: 10/03/2022  Chart reviewed.  As per chart review, plan is for patient to be evaluated by PT/OT for potential SNF placement.  No acute palliative needs at this time.  Goals are clear but dispo is barrier. TOC following closely.   PMT will shadow her chart, monitor her peripherally, remain available to patient and family for palliative support patient throughout her hospitalization.    Please re-engage with PMT if goals change, at patient/family's request, or if patient's health deteriorates during hospitalization.  Thank you for allowing the Palliative Medicine Team to assist in the care of Hannah [name].  Samara Deist L. Bonita Quin, DNP, FNP-BC Palliative Medicine Team  No charge

## 2022-10-06 NOTE — Progress Notes (Signed)
Occupational Therapy Treatment Patient Details Name: Hannah [name] MRN: [id number] DOB: 08/21/1940 Today's Date: 10/06/2022   History of present illness Pt. Is baseline nonverbal, diabetic, CHF, a-fib. Complicated patient with C1-C2 fracture following a fall and prolonged hospitalization at [facility], recently discharged to [facility] now admitted with multi-factorial shock and altered mental status. CT on admission reveals known recent C1 and C2 fractures with increased fracture  displacement and angulation including new complete posterior  dislocation of the lateral masses of C1 relative to C2. Resultant  severe spinal stenosis at C1-2. Per secure chat with Dr. Katrinka Blazing Neurosurgery states no further tx planned, ordering soft collar.   OT comments  Pt seen for OT session with focus on positioning and BUE P/AAROM at the shoulders, elbows, wrists, hands, all planes.  Noted pt able to achieve ~75% of elbow flexion (gravity eliminated) with min A after 10 reps.  Will continue to benefit from active assisted hand to mouth patterns to work towards LUE engagement in self feeding/grooming tasks.  Pt reporting 10/10 pain at rest at start of session, improving to 6/10 pain by end of session after completion of P/AAROM in BUEs.  Assisted with supportive positioning with RN present, moving pt from slight R sided tilt to supine to allow RN to perform colostomy care.  OT assisted with elevating R forearm and hand end of session to promote edema reduction, and wedged rolled towel to R side of neck to promote more midline neck alignment to pt's tolerance, though still quite significant neck lateral flexion to the R.  New soft collar not yet in room.  Pt had posterior portion of another collar in place but malaligned d/t poor fit.  OT adjusted positioning to improve alignment as able within pt's tolerance.  Will continue to follow to work towards OT goals in [place].       If plan is discharge home, recommend the following:  Two people  to help with walking and/or transfers;Two people to help with bathing/dressing/bathroom;Assistance with cooking/housework;Assist for transportation;Help with stairs or ramp for entrance;Direct supervision/assist for financial management;Direct supervision/assist for medications management   Equipment Recommendations  Other (comment) (defer to next venue of care)    Recommendations for Other Services      Precautions / Restrictions Precautions Precautions: Cervical Precaution Comments: C1-2 fracture and disclocation Required Braces or Orthoses: Cervical Brace Cervical Brace: Soft collar Restrictions Weight Bearing Restrictions: No       Mobility Bed Mobility               General bed mobility comments: shifted pt from R sided tilt to supine, HOB elevated in both positions total assist Patient Response: Cooperative  Transfers                         Balance                                           ADL either performed or assessed with clinical judgement   ADL Overall ADL's : Needs assistance/impaired                                       General ADL Comments: total A of 2 for all self care.    Extremity/Trunk Assessment Upper Extremity Assessment Upper Extremity  Assessment: LUE deficits/detail;RUE deficits/detail RUE Deficits / Details: 1/5 R shoulder flex/ext, 2- shoulder IR/ER, 2- elbow flex/ext, 1/5 wrist flex/ext LUE Deficits / Details: grossly 1/5 shoulder flex/abd, 2- ER/IR, 2+ elbow flex/ext, 2- wrist flex/ext            Vision Patient Visual Report: No change from baseline     Perception     Praxis      Cognition Arousal: Alert Behavior During Therapy: Flat affect                                   General Comments: answers yes/ no        Exercises Other Exercises Other Exercises: Performed bilat P/AAROM for shoulder flex, abd, ER/IR, forearm pron/sup, wrist flex/ext, digit  flex/ext x2 sets 10 reps each    Shoulder Instructions       General Comments bilat hands 1+ edema, R forearm and upper arm at least 2+ edema; positioned forearm and hand in elevation with pillow end of session.    Pertinent Vitals/ Pain       Pain Assessment Pain Score: 6  Pain Location: Neck, arms with movement in bed Pain Descriptors / Indicators: Grimacing, Guarding Pain Intervention(s): Monitored during session, Repositioned, Limited activity within patient's tolerance  Home Living                                          Prior Functioning/Environment              Frequency  Min 1X/week        Progress Toward Goals  OT Goals(current goals can now be found in the care plan section)  Progress towards OT goals: Progressing toward goals  Acute Rehab OT Goals Patient Stated Goal: "Rehab" OT Goal Formulation: With patient/family Time For Goal Achievement: 10/19/22 Potential to Achieve Goals: Fair  Plan      Co-evaluation                 AM-PAC OT "6 Clicks" Daily Activity     Outcome Measure   Help from another person eating meals?: Total Help from another person taking care of personal grooming?: Total Help from another person toileting, which includes using toliet, bedpan, or urinal?: Total Help from another person bathing (including washing, rinsing, drying)?: Total Help from another person to put on and taking off regular upper body clothing?: Total Help from another person to put on and taking off regular lower body clothing?: Total 6 Click Score: 6    End of Session Equipment Utilized During Treatment: Cervical collar  OT Visit Diagnosis: History of falling (Z91.81);Muscle weakness (generalized) (M62.81);Pain;Unsteadiness on feet (R26.81) Pain - part of body:  (all over)   Activity Tolerance Patient limited by pain   Patient Left in bed;with call bell/phone within reach;with bed alarm set;with nursing/sitter in room;with  family/visitor present   Nurse Communication Mobility status        Time: 4098-1191 OT Time Calculation (min): 44 min  Charges: OT General Charges $OT Visit: 1 Visit OT Treatments $Self Care/Home Management : 8-22 mins $Therapeutic Exercise: 23-37 mins  Danelle Earthly, MS, OTR/L   Otis Dials 10/06/2022, 4:10 PM

## 2022-10-06 NOTE — Plan of Care (Signed)
  Problem: Education: Goal: Understanding of CV disease, CV risk reduction, and recovery process will improve Outcome: Progressing   Problem: Activity: Goal: Ability to return to baseline activity level will improve Outcome: Progressing   Problem: Health Behavior/Discharge Planning: Goal: Ability to safely manage health-related needs after discharge will improve Outcome: Progressing   Problem: Education: Goal: Knowledge of patient specific risk factors will improve Hannah [name] N/A or DELETE if not current risk factor) Outcome: Progressing

## 2022-10-07 DIAGNOSIS — N179 Acute kidney failure, unspecified: Secondary | ICD-10-CM | POA: Diagnosis not present

## 2022-10-07 DIAGNOSIS — D62 Acute posthemorrhagic anemia: Secondary | ICD-10-CM | POA: Diagnosis not present

## 2022-10-07 DIAGNOSIS — S13101A Dislocation of unspecified cervical vertebrae, initial encounter: Secondary | ICD-10-CM | POA: Diagnosis not present

## 2022-10-07 DIAGNOSIS — R578 Other shock: Secondary | ICD-10-CM | POA: Diagnosis not present

## 2022-10-07 DIAGNOSIS — Z515 Encounter for palliative care: Secondary | ICD-10-CM | POA: Diagnosis not present

## 2022-10-07 LAB — CBC WITH DIFFERENTIAL/PLATELET
Abs Immature Granulocytes: 0.16 10*3/uL — ABNORMAL HIGH (ref 0.00–0.07)
Basophils Absolute: 0 10*3/uL (ref 0.0–0.1)
Basophils Relative: 0 %
Eosinophils Absolute: 0.1 10*3/uL (ref 0.0–0.5)
Eosinophils Relative: 1 %
HCT: 25.2 % — ABNORMAL LOW (ref 36.0–46.0)
Hemoglobin: 7.7 g/dL — ABNORMAL LOW (ref 12.0–15.0)
Immature Granulocytes: 1 %
Lymphocytes Relative: 12 %
Lymphs Abs: 1.5 10*3/uL (ref 0.7–4.0)
MCH: 28.1 pg (ref 26.0–34.0)
MCHC: 30.6 g/dL (ref 30.0–36.0)
MCV: 92 fL (ref 80.0–100.0)
Monocytes Absolute: 0.6 10*3/uL (ref 0.1–1.0)
Monocytes Relative: 5 %
Neutro Abs: 10.2 10*3/uL — ABNORMAL HIGH (ref 1.7–7.7)
Neutrophils Relative %: 81 %
Platelets: 316 10*3/uL (ref 150–400)
RBC: 2.74 MIL/uL — ABNORMAL LOW (ref 3.87–5.11)
RDW: 19.5 % — ABNORMAL HIGH (ref 11.5–15.5)
WBC: 12.6 10*3/uL — ABNORMAL HIGH (ref 4.0–10.5)
nRBC: 0.2 % (ref 0.0–0.2)

## 2022-10-07 LAB — BASIC METABOLIC PANEL
Anion gap: 7 (ref 5–15)
BUN: 27 mg/dL — ABNORMAL HIGH (ref 8–23)
CO2: 25 mmol/L (ref 22–32)
Calcium: 8.3 mg/dL — ABNORMAL LOW (ref 8.9–10.3)
Chloride: 105 mmol/L (ref 98–111)
Creatinine, Ser: 0.62 mg/dL (ref 0.44–1.00)
GFR, Estimated: >60 mL/min (ref >60)
Glucose, Bld: 104 mg/dL — ABNORMAL HIGH (ref 70–99)
Potassium: 3.6 mmol/L (ref 3.5–5.1)
Sodium: 137 mmol/L (ref 135–145)

## 2022-10-07 MED ORDER — SODIUM CHLORIDE 0.9 % IV BOLUS
1000.0000 mL | Freq: Once | INTRAVENOUS | Status: AC
  Administered 2022-10-07: 1000 mL via INTRAVENOUS

## 2022-10-07 NOTE — Progress Notes (Signed)
Daily Progress Note   Patient Name: Hannah [name]       Date: 10/07/2022 DOB: 11/09/1940  Age: 82 y.o. MRN#: [id number] Attending Physician: Tresa Moore, MD Primary Care Physician: [name], MD Admit Date: 09/22/2022  Reason for Consultation/Follow-up: Establishing goals of care  HPI/Brief Hospital Review: 82 y.o. female  with past medical history of PE, HFpEF, CKD stage 3a, T2DM, HTN, paroxsymal atrial fibrillation and hypothyroidism admitted from [facility] on 09/15/2022 with anemia found on routine blood draw (hemoglobin 6.8).   Noted recent month long stay at [facility] s/p laminectomy for C1/C2 fracture with associated central cord syndrome and small subarachnoid hemorrhage after suffering a fall at home.   CT cervical spine 1. Known recent C1 and C2 fractures with increased fracture displacement and angulation including new complete posterior dislocation of the lateral masses of C1 relative to C2. Resultant severe spinal stenosis at C1-2.   CT abdomen/pelvis 1. Enlargement and heterogeneity of the right iliopsoas muscles with associated fat stranding is suspicious for an intramuscular hematoma. 2. Small to moderate right pleural effusion with associated atelectasis. 3. Anasarca.   Palliative medicine was consulted for assisting with goals of care conversations.  Goals of care initially for full comfort measures but have since been reversed by family with goal for SNF.  Subjective: Extensive chart review has been completed prior to meeting patient including labs, vital signs, imaging, progress notes, orders, and available advanced directive documents from current and previous encounters.    Received message to return call to son-Benjamin who was  requesting medical updates. Attempted call to son-Benjamin, no answer and HIPAA compliant VM left. Called and spoke with daughter-Michele, plan set to meet at bedside with Elon Jester and daughter-Vanessa.  Visited with [name] at her bedside. She is awake, alert and able to engage in conversation with continued moments of confusion. She states she is at [facility], unsure of why she remains in hospital, able to correctly state year and month.  Met initially with Michele-daughter and Vanessa-daughter. We reviewed [name]'s current medical condition, functional status remains poor-continues to be evaluated and assessed by PT/OT. Concerns were addressed related to ongoing poor functional status and ability to participate in rehab. Son-Benjamin joined meeting via speaker phone. We again addressed the process including rehab recommendations, bed offers and  insurance approval for SNF. Sharlet Salina shares his frustration and ongoing irritation related to SNF approval process and frustrations around out of pocket cost for LTC. Sharlet Salina again shares his grievance and frustrations related to the care [name] received prior to being admitted to [facility]. We also discussed initiating tube feeds via PEG tube, addressed confusion around PEG tube, family with the understanding PEG was removed during comfort care, assured family PEG not typically removed but tube feedings not typically initiated under comfort care. We also addressed the ongoing issue of phlebotomy having much difficulty obtaining labs.  Dr. Georgeann Oppenheim joined meeting and conversation. He provides medical updates to family, again addressing concern for [name]'s ability to progress with therapy and again reviewed the process for SNF authorization. Nutrition again was addressed and plan for tube feedings to be initiated if PEG remains functional-according to family [facility] was utilizing PEG for tube feedings.  Family inquired at that time regarding hospice care.  Clarified that hospice and SNF services cannot be provided at the same time. We briefly reviewed the levels of hospice care including home versus LTC versus IPU. Family shares their frustrations around out of pocket costs for routine level of hospice care. Educated on role of outpatient palliative care provider at discharge that can follow at any level of care.  Family also inquired and expressed interest in readdressing labs as able as well as non-invasive interventions to further assess hematoma/bleed status. Dr. Georgeann Oppenheim assured family he has been closely monitoring vital signs and clinical assessment without the ability to monitor H/H.  Sharlet Salina dropped call. Elon Jester and [name] expressed their interest in learning more about AD including HCPOA. Left AD booklet at bedside after meeting to attend a separate family meeting, on return to bedside-no family present and AD booklet not at bedside to review with daughters.  PMT to remain available peripherally if needs or concerns related to goals of care arise.  Care plan was discussed with primary team.  Thank you for allowing the Palliative Medicine Team to assist in the care of this patient.  Total time:  50 minutes  Time spent includes: Detailed review of medical records (labs, imaging, vital signs), medically appropriate exam (mental status, respiratory, cardiac, skin), discussed with treatment team, counseling and educating patient, family and staff, documenting clinical information, medication management and coordination of care.  Leeanne Deed, DNP, AGNP-C Palliative Medicine   Please contact Palliative Medicine Team phone at [phone] for questions and concerns.

## 2022-10-07 NOTE — Progress Notes (Signed)
PROGRESS NOTE    Hannah [name]  ZOX:[id number] DOB: [birth year] DOA: 10-06-22 PCP: [name], MD    Brief Narrative:  82 y.o. female with past medical history significant for recent C1/C2 fracture and central cord syndrome approximately 1 month ago following a fall at home status post laminectomy at [facility], PE, HFpEF, CKD Stage IIIa, DM Type 2, HTN, paroxsymal A.fib, Hypothyroidism. Of note, she was brought to the emergency department on 09/28/2022 because of bleeding from a PICC line on her right arm. She was brought from the nursing home to the emergency department because of hemoglobin of 6.8 on 10/06/22.   Of note, details of recent fracture are as follows: She had a fall on 8/21 with associated neck pain and bilateral weakness. She was admitted at [facility] where CT showed C1-C1 fracture and a small subarachnoid hemorrhage. MRI obtained showed C1 and C2 fracture, ossification of posterior longitudinal ligament, cervical spondylosis, cervical spinal cord injury, central cord syndrome.  Initial plan by neuro surgery at [facility] was for posterior cervical decompression, instrumentation and fusion from C1-C6 to be done on 8/28, but it was felt she would be best served by medically stabilizing and correcting coagulopathy prior surgery.  However on 8/25 family requested she be transferred to [facility] for Neurosurgical intervention there.     She was admitted to the ICU for hypovolemic/hemorrhagic/septic shock and severe anemia.  She was treated with IV fluids, empiric IV antibiotics and IV vasopressors.  She was also transfused with 2 units of packed red blood cells.  Workup also showed that she had new C1 and C2 dislocation on top of recent C1 and C2 fractures with resultant severe spinal stenosis at C1-C2.  Dr. Katrinka Blazing, neurosurgeon, was consulted to assist with management.  He discussed the case with neurosurgeon at [facility] who recommended nonsurgical approach.   Assessment &  Plan:   Principal Problem:   Hemorrhagic shock (HCC) Active Problems:   Pulmonary hypertension (HCC)   CKD stage 3a, GFR 45-59 ml/min (HCC)   CAD (coronary artery disease)   Paroxysmal atrial fibrillation (HCC)   Chronic diastolic CHF (congestive heart failure) (HCC)   Closed dislocation of cervical vertebra   Pressure injury of skin   AKI (acute kidney injury) (HCC)   Acute blood loss anemia   Hyperkalemia   Hyponatremia   Pleural effusion on right  Hypovolemic/hemorrhagic/septic shock   Autonomic dysfunction in the setting of recent C1 and C2 spine fracture s/p laminectomy at [facility], now with worsening dislocation and significant spinal stenosis   Severe sepsis of unknown source   Acute blood loss anemia s/p transfusion with 2 units of PRBCs    Acute kidney injury on CKD stage IIIa   Acute metabolic encephalopathy   Type 2 diabetes mellitus   Right iliopsoas intramuscular hematoma   Chronic diastolic CHF   Pulmonary hypertension   Hyperkalemia   Hypoglycemia   Hyponatremia   Hypothyroidism   Small to moderate right pleural effusion without injury atelectasis  PLAN Case discussed with palliative care nurse practitioner..  Her discussion with patient's family they have elected to forego comfort measures. Work as tolerated with PT/OT.  Cervical collar placed.  Unfortunately we are unable to draw labs at this time.  TOC consulted, bed search initiated for SNF.    DVT prophylaxis: SCDs Code Status: DNR Family Communication: Attempted to call son Sharlet Salina [phone].  No answer.  Voicemail left. Disposition Plan: Status is: Inpatient Remains inpatient appropriate because: C1-C2 fracture.  Will need placement.   Level of care: Med-Surg  Consultants:  Palliative care  Procedures:  None  Antimicrobials: None   Subjective: Seen and examined.  Resting in bed, appears weak and deconditioned.  Does awaken and answer questions  appropriately  Objective: Vitals:   10/06/22 0554 10/06/22 2049 10/07/22 0547 10/07/22 0820  BP: (!) 96/53 139/77 137/79 114/65  Pulse: 81 87 83 89  Resp:    12  Temp: (!) 97.5 F (36.4 C) (!) 97.5 F (36.4 C) (!) 97.5 F (36.4 C) (!) 97.3 F (36.3 C)  TempSrc: Oral Oral Oral   SpO2: 100% 100% 99% 91%  Weight:        Intake/Output Summary (Last 24 hours) at 10/07/2022 1125 Last data filed at 10/07/2022 0500 Gross per 24 hour  Intake --  Output 250 ml  Net -250 ml   Filed Weights   10/24/22 0839 October 24, 2022 2030 10/01/22 0702  Weight: 90.9 kg 86.6 kg 91.2 kg    Examination:  General exam: NAD.  Appears frail Respiratory system: Poor respiratory effort.  Scattered crackles.  Normal work of breathing.  Room air Cardiovascular system: S1-S2, RRR, no murmurs, no pedal edema Gastrointestinal system: Soft NT/ND, normal bowel sounds Central nervous system: Alert.  Oriented times person.  No focal deficits Extremities: Markedly decreased power in all 4 extremities 0x5 Skin: No rashes, lesions or ulcers Psychiatry: Judgement and insight appear impaired. Mood & affect flattened.     Data Reviewed: I have personally reviewed following labs and imaging studies  CBC: Recent Labs  Lab 10/24/2022 2202 10/01/22 0354  WBC  --  22.2*  HGB 7.7* 6.6*  HCT 23.6* 21.6*  MCV  --  94.3  PLT  --  334   Basic Metabolic Panel: Recent Labs  Lab 10-24-2022 2202 10/01/22 0354 10/01/22 0355 10/05/22 1344  NA 132*  --  132* 136  K 4.8  --  4.8 4.6  CL 99  --  100 101  CO2 24  --  24 22  GLUCOSE 144*  --  121* 98  BUN 53*  --  50* 25*  CREATININE 1.39*  --  1.27* 0.69  CALCIUM 8.2*  --  8.0* 8.2*  MG  --  2.1  --   --   PHOS  --  3.0 3.1  --    GFR: Estimated Creatinine Clearance: 63.9 mL/min (by C-G formula based on SCr of 0.69 mg/dL). Liver Function Tests: Recent Labs  Lab 10/01/22 0355  ALBUMIN 1.9*   No results for input(s): "LIPASE", "AMYLASE" in the last 168 hours. No  results for input(s): "AMMONIA" in the last 168 hours. Coagulation Profile: No results for input(s): "INR", "PROTIME" in the last 168 hours.  Cardiac Enzymes: No results for input(s): "CKTOTAL", "CKMB", "CKMBINDEX", "TROPONINI" in the last 168 hours. BNP (last 3 results) No results for input(s): "PROBNP" in the last 8760 hours. HbA1C: No results for input(s): "HGBA1C" in the last 72 hours. CBG: Recent Labs  Lab 10/02/22 0326 10/02/22 0734 10/02/22 0808 10/02/22 0811 10/02/22 1118  GLUCAP 71 64* 63* 81 99   Lipid Profile: No results for input(s): "CHOL", "HDL", "LDLCALC", "TRIG", "CHOLHDL", "LDLDIRECT" in the last 72 hours. Thyroid Function Tests: No results for input(s): "TSH", "T4TOTAL", "FREET4", "T3FREE", "THYROIDAB" in the last 72 hours. Anemia Panel: No results for input(s): "VITAMINB12", "FOLATE", "FERRITIN", "TIBC", "IRON", "RETICCTPCT" in the last 72 hours. Sepsis Labs: Recent Labs  Lab October 24, 2022 2202 10/05/22 1344  PROCALCITON 0.23 0.28  LATICACIDVEN 1.5  --  Recent Results (from the past 240 hour(s))  Resp panel by RT-PCR (RSV, Flu A&B, Covid) Anterior Nasal Swab     Status: None   Collection Time: 09/16/2022  8:47 AM   Specimen: Anterior Nasal Swab  Result Value Ref Range Status   SARS Coronavirus 2 by RT PCR NEGATIVE NEGATIVE Final    Comment: (NOTE) SARS-CoV-2 target nucleic acids are NOT DETECTED.  The SARS-CoV-2 RNA is generally detectable in upper respiratory specimens during the acute phase of infection. The lowest concentration of SARS-CoV-2 viral copies this assay can detect is 138 copies/mL. A negative result does not preclude SARS-Cov-2 infection and should not be used as the sole basis for treatment or other patient management decisions. A negative result may occur with  improper specimen collection/handling, submission of specimen other than nasopharyngeal swab, presence of viral mutation(s) within the areas targeted by this assay, and  inadequate number of viral copies(<138 copies/mL). A negative result must be combined with clinical observations, patient history, and epidemiological information. The expected result is Negative.  Fact Sheet for Patients:  BloggerCourse.com  Fact Sheet for Healthcare Providers:  SeriousBroker.it  This test is no t yet approved or cleared by the [place] FDA and  has been authorized for detection and/or diagnosis of SARS-CoV-2 by FDA under an Emergency Use Authorization (EUA). This EUA will remain  in effect (meaning this test can be used) for the duration of the COVID-19 declaration under Section 564(b)(1) of the Act, 21 U.S.C.section 360bbb-3(b)(1), unless the authorization is terminated  or revoked sooner.       Influenza A by PCR NEGATIVE NEGATIVE Final   Influenza B by PCR NEGATIVE NEGATIVE Final    Comment: (NOTE) The Xpert Xpress SARS-CoV-2/FLU/RSV plus assay is intended as an aid in the diagnosis of influenza from Nasopharyngeal swab specimens and should not be used as a sole basis for treatment. Nasal washings and aspirates are unacceptable for Xpert Xpress SARS-CoV-2/FLU/RSV testing.  Fact Sheet for Patients: BloggerCourse.com  Fact Sheet for Healthcare Providers: SeriousBroker.it  This test is not yet approved or cleared by the [place] FDA and has been authorized for detection and/or diagnosis of SARS-CoV-2 by FDA under an Emergency Use Authorization (EUA). This EUA will remain in effect (meaning this test can be used) for the duration of the COVID-19 declaration under Section 564(b)(1) of the Act, 21 U.S.C. section 360bbb-3(b)(1), unless the authorization is terminated or revoked.     Resp Syncytial Virus by PCR NEGATIVE NEGATIVE Final    Comment: (NOTE) Fact Sheet for Patients: BloggerCourse.com  Fact Sheet for Healthcare  Providers: SeriousBroker.it  This test is not yet approved or cleared by the [place] FDA and has been authorized for detection and/or diagnosis of SARS-CoV-2 by FDA under an Emergency Use Authorization (EUA). This EUA will remain in effect (meaning this test can be used) for the duration of the COVID-19 declaration under Section 564(b)(1) of the Act, 21 U.S.C. section 360bbb-3(b)(1), unless the authorization is terminated or revoked.  Performed at [facility], [address] [place], [place] [address]   Blood Culture (routine x 2)     Status: None   Collection Time: 09/12/2022 10:22 AM   Specimen: BLOOD  Result Value Ref Range Status   Specimen Description BLOOD BLOOD LEFT ARM  Final   Special Requests   Final    BOTTLES DRAWN AEROBIC AND ANAEROBIC Blood Culture adequate volume   Culture   Final    NO GROWTH 5 DAYS Performed at  [facility], [address] [place], [place] [address]    Report Status 10/05/2022 FINAL  Final  MRSA Next Gen by PCR, Nasal     Status: None   Collection Time: 10/29/22  8:21 PM   Specimen: Nasal Mucosa; Nasal Swab  Result Value Ref Range Status   MRSA by PCR Next Gen NOT DETECTED NOT DETECTED Final    Comment: (NOTE) The GeneXpert MRSA Assay (FDA approved for NASAL specimens only), is one component of a comprehensive MRSA colonization surveillance program. It is not intended to diagnose MRSA infection nor to guide or monitor treatment for MRSA infections. Test performance is not FDA approved in patients less than 77 years old. Performed at [facility], [address], [place], [place] [address]          Radiology Studies: No results found.      Scheduled Meds:  Chlorhexidine Gluconate Cloth  6 each Topical Daily   DULoxetine  30 mg Oral Daily   ferrous sulfate  325 mg Oral Q breakfast   pantoprazole (PROTONIX) IV  40 mg Intravenous Q24H   Continuous Infusions:   LOS:  7 days    Tresa Moore, MD [facility]   If 7PM-7AM, please contact night-coverage  10/07/2022, 11:25 AM

## 2022-10-07 NOTE — Progress Notes (Signed)
  [facility]- Administrator, [facility] will follow peripherally until a discharge disposition has been determined .     Please don't hesitate to call with any Hospice related questions or concerns.    Thank you for the opportunity to participate in this patient's care. [facility] Liaison [phone]

## 2022-10-07 NOTE — Significant Event (Signed)
Joined palliative care NP Ladona Ridgel in family meeting with 2 daughters in person and 1 son over the phone.  Family reaffirmed their desire for aggressive medical intervention.  Plan: Bolus 1L NS Re-attempt labwork RD consultation for initiation of tube feeds TOC working on placement options  Lolita Patella MD

## 2022-10-07 NOTE — Plan of Care (Signed)
  Problem: Cardiovascular: Goal: Ability to achieve and maintain adequate cardiovascular perfusion will improve Outcome: Progressing   Problem: Ischemic Stroke/TIA Tissue Perfusion: Goal: Complications of ischemic stroke/TIA will be minimized Outcome: Progressing   Problem: Clinical Measurements: Goal: Will remain free from infection Outcome: Progressing   Problem: Self-Care: Goal: Ability to participate in self-care as condition permits will improve Outcome: Not Progressing

## 2022-10-07 NOTE — TOC Progression Note (Signed)
Transition of Care [facility]) - Progression Note    Patient Details  Name: Hannah [name] MRN: [id number] Date of Birth: July 02, 1940  Transition of Care [facility]) CM/SW Contact  Garret Reddish, RN Phone Number: 10/07/2022, 4:00 PM  Clinical Narrative:     Chart reviewed.  I attempted to call and update Elon Jester and Sharlet Salina patient's children on status of [name]'s case.  I have left voice messages for both children.  I have also left a voice message for Lakita Sifford at DSS.    TOC will continue to follow for discharge planning.         Expected Discharge Plan and Services                                               Social Determinants of Health (SDOH) Interventions SDOH Screenings   Food Insecurity: No Food Insecurity (09/15/2022)   Received from [facility]  Housing: Low Risk  (05/20/2022)  Transportation Needs: No Transportation Needs (09/15/2022)   Received from [facility]  Utilities: Not At Risk (09/15/2022)   Received from [facility]  Alcohol Screen: Low Risk  (04/29/2022)  Depression (PHQ2-9): Low Risk  (09/13/2021)  Financial Resource Strain: Low Risk  (09/15/2022)   Received from [facility]  Physical Activity: Insufficiently Active (06/24/2021)  Social Connections: Moderately Integrated (06/24/2021)  Stress: No Stress Concern Present (06/24/2021)  Tobacco Use: Medium Risk (09/28/2022)    Readmission Risk Interventions    01/04/2021    1:14 PM  Readmission Risk Prevention Plan  Transportation Screening Complete  PCP or Specialist Appt within 5-7 Days --  Home Care Screening Complete  Medication Review (RN CM) Complete

## 2022-10-08 DIAGNOSIS — R578 Other shock: Secondary | ICD-10-CM | POA: Diagnosis not present

## 2022-10-08 LAB — PHOSPHORUS: Phosphorus: 2.9 mg/dL (ref 2.5–4.6)

## 2022-10-08 LAB — MAGNESIUM: Magnesium: 1.9 mg/dL (ref 1.7–2.4)

## 2022-10-08 LAB — GLUCOSE, CAPILLARY: Glucose-Capillary: 88 mg/dL (ref 70–99)

## 2022-10-08 MED ORDER — PROSOURCE TF20 ENFIT COMPATIBL EN LIQD
60.0000 mL | Freq: Every day | ENTERAL | Status: DC
  Administered 2022-10-09 – 2022-10-11 (×2): 60 mL
  Filled 2022-10-08 (×4): qty 60

## 2022-10-08 MED ORDER — ENOXAPARIN SODIUM 40 MG/0.4ML IJ SOSY
40.0000 mg | PREFILLED_SYRINGE | INTRAMUSCULAR | Status: DC
  Administered 2022-10-08 – 2022-10-14 (×7): 40 mg via SUBCUTANEOUS
  Filled 2022-10-08 (×7): qty 0.4

## 2022-10-08 MED ORDER — OSMOLITE 1.5 CAL PO LIQD
1000.0000 mL | ORAL | Status: DC
  Administered 2022-10-08 – 2022-10-11 (×3): 1000 mL

## 2022-10-08 NOTE — Progress Notes (Addendum)
Physical Therapy Treatment Patient Details Name: Hannah [name] MRN: [id number] DOB: 03/05/1940 Today's Date: 10/08/2022   History of Present Illness Pt. is baseline nonverbal, diabetic, CHF, a-fib. Complicated patient with C1-C2 fracture following a fall and prolonged hospitalization at [facility], recently discharged to [facility] now admitted with multi-factorial shock and altered mental status. CT on admission reveals known recent C1 and C2 fractures with increased fracture  displacement and angulation including new complete posterior  dislocation of the lateral masses of C1 relative to C2. Resultant  severe spinal stenosis at C1-2. Per secure chat with Dr. Katrinka Blazing Neurosurgery states no further tx planned, ordering soft collar.    PT Comments  Author was contacted by RN staff requesting assistance with Pt's brace. Upon arrival to room, pt wearing only posterior half of rigid [place] brace. Discussed with MD via phone call, " Neurosurgery wants pt in soft collar." Author replaced rigid collar with soft collar. Pt endorses improved comfort with soft collar. Pt requires +2 assistance for all mobility. Unsafe to transfer at this time. Recommend hoyer lift transfer for any OOB transfers. Poor sensation noted throughout with AAROM/PROM perform to prevent contractures. PT will continue to follow to decrease caregiver burden.     If plan is discharge home, recommend the following: Two people to help with walking and/or transfers;Two people to help with bathing/dressing/bathroom     Equipment Recommendations  None recommended by PT       Precautions / Restrictions Precautions Precautions: Cervical Precaution Comments: C1-2 fracture and disclocation. Conservative management Required Braces or Orthoses: Cervical Brace Cervical Brace: Soft collar Restrictions Weight Bearing Restrictions: No     Mobility  Bed Mobility Overal bed mobility: Needs Assistance Bed Mobility: Rolling Rolling: Total assist, +2  for physical assistance  General bed mobility comments: Rolled pt to remove ridgid collar  to apply soft collar.      Cognition Arousal: Alert Behavior During Therapy: Flat affect Overall Cognitive Status: No family/caregiver present to determine baseline cognitive functioning     General Comments: Pt was awake with RN students at bedside. Author clarified MD orders for soft cervical collar. pt was only wearing posterior part of a [place] ridgid collar.               Pertinent Vitals/Pain Pain Assessment Pain Assessment: No/denies pain Pain Score: 0-No pain Pain Intervention(s): Limited activity within patient's tolerance, Monitored during session     PT Goals (current goals can now be found in the care plan section) Acute Rehab PT Goals Patient Stated Goal: none stated Progress towards PT goals: Progressing toward goals    Frequency    Min 1X/week           Co-evaluation     PT goals addressed during session: Mobility/safety with mobility;Balance;Proper use of DME;Strengthening/ROM        AM-PAC PT "6 Clicks" Mobility   Outcome Measure  Help needed turning from your back to your side while in a flat bed without using bedrails?: Total Help needed moving from lying on your back to sitting on the side of a flat bed without using bedrails?: Total Help needed moving to and from a bed to a chair (including a wheelchair)?: Total Help needed standing up from a chair using your arms (e.g., wheelchair or bedside chair)?: Total Help needed to walk in hospital room?: Total Help needed climbing 3-5 steps with a railing? : Total 6 Click Score: 6    End of Session Equipment Utilized During Treatment: Cervical collar Activity Tolerance:  Patient limited by pain;Patient limited by fatigue Patient left: in bed;with call bell/phone within reach;with nursing/sitter in room Nurse Communication: Mobility status PT Visit Diagnosis: Other abnormalities of gait and mobility  (R26.89);Muscle weakness (generalized) (M62.81);Adult, failure to thrive (R62.7)     Time: 2952-8413 PT Time Calculation (min) (ACUTE ONLY): 10 min  Charges:    $Therapeutic Activity: 8-22 mins PT General Charges $$ ACUTE PT VISIT: 1 Visit                     Jetta Lout PTA 10/08/22, 1:19 PM

## 2022-10-08 NOTE — Progress Notes (Signed)
On my first assessment of Hannah [name] she  was laying in bed with only the back part of her C collar was on but not in an accurate placement.  When I asked the patient she stated that it had bee of most of day.  Visual of patient had her head leaning toward the right side with a towel for support. I asked patient if she could straighten her head and she could only move a small amount.   This nurse did not feel comfortable moving patient's head to proper position and placing the c-collar in position .

## 2022-10-08 NOTE — Plan of Care (Signed)
  Problem: Education: Goal: Understanding of CV disease, CV risk reduction, and recovery process will improve Outcome: Progressing   Problem: Activity: Goal: Ability to return to baseline activity level will improve Outcome: Progressing   

## 2022-10-08 NOTE — Progress Notes (Signed)
Initial Nutrition Assessment  DOCUMENTATION CODES:   Not applicable  INTERVENTION:  - Per MD, plan to start tube feeds today. Will plan to meet 100% of estimated needs at this time.  Initiate tube feeding via PEG: Osmolite 1.5 at 50 ml/h (1200 ml per day) *Start at 66mL/hr and advance by 10mL Q12H to goal  Prosource TF20  60 ml daily Provides 1880 kcal, 95 gm protein, 914 ml free water daily  - Monitor magnesium, potassium, and phosphorus BID for at least 3 days, MD to replete as needed, as pt is at risk for refeeding syndrome given minimal intake x1 week with unknown intake prior.  - DYS 1 diet as medically appropriate.  - Magic cup BID with meals, each supplement provides 290 kcal and 9 grams of protein  - Monitor weight trends.   - New weight ordered.   NUTRITION DIAGNOSIS:   Inadequate oral intake related to chronic illness as evidenced by energy intake < or equal to 50% for > or equal to 5 days.  GOAL:   Patient will meet greater than or equal to 90% of their needs  MONITOR:   Supplement acceptance, Diet advancement, Labs, PO intake, Weight trends, TF tolerance, Skin  REASON FOR ASSESSMENT:   Low Braden    ASSESSMENT:   82 y.o. female with PMH significant for recent C1/C2 fracture and central cord syndrome ~1 month ago following a fall at home s/p laminectomy at [facility], PE, HFpEF, CKD Stage IIIa, DM Type 2, HTN, paroxsymal A.fib, Hypothyroidism. Patient was admitted to the ICU for hypovolemic/hemorrhagic/septic shock and severe anemia.   9/27: Admit 9/29: transitioned to Comfort Measures 10/2: family revered Comfort Measures  Called bedside telephone to obtain nutrition history. Family member answered phone and stated she was from out of town, so unable to provide any nutrition history.   Per EMR, weight mostly stable the past 5 months.   It is noted that patient received a PEG during recent [facility] admission and was receiving tube feeds.  It is unclear at this  time what tube feed regimen patient had been receiving and if she had been taking an oral diet as well.  Family decided to reverse comfort measures on 10/2.   Patient is now on a DYS 1 diet and is documented to be consuming either 0% of 50% of meals.   Per discussion with MD, plan to restart tube feeds via PEG. As it is unclear what regimen had was receiving, will start patient on Osmolite 1.5 at this time. Will start continuous tube feeds for better tolerance and as patient appears to have had minimal intake since admit >1 week ago.  As patient's intake has varied, will plan to meet 100% of estimated needs at this time and monitor oral intake.    Medications reviewed and include: 325mg  ferous sulfate  Labs reviewed:  HA1C 6.0   NUTRITION - FOCUSED PHYSICAL EXAM:  Unable to complete, RD working remotely  Diet Order:   Diet Order             DIET - DYS 1 Room service appropriate? Yes with Assist; Fluid consistency: Thin  Diet effective now                   EDUCATION NEEDS:  Not appropriate for education at this time  Skin:  Skin Assessment: Skin Integrity Issues: Skin Integrity Issues:: Stage II Stage II: Buttocks  Last BM:  unknown - no BM documented since admit - colostomy  Height:  Ht Readings from Last 1 Encounters:  09/28/22 5\' 7"  (1.702 m)   Weight:  Wt Readings from Last 1 Encounters:  10/01/22 91.2 kg   Ideal Body Weight:  61.36 kg  BMI:  Body mass index is 31.49 kg/m.  Estimated Nutritional Needs:  Kcal:  1850-2000 kcals Protein:  85-100 grams Fluid:  >/= 1.8L    Shelle Iron RD, LDN For contact information, refer to [facility].

## 2022-10-08 NOTE — Progress Notes (Signed)
PROGRESS NOTE    Hannah [name]  ZOX:[id number] DOB: 1940/04/28 DOA: 2022-10-25 PCP: [name], MD    Brief Narrative:  82 y.o. female with past medical history significant for recent C1/C2 fracture and central cord syndrome approximately 1 month ago following a fall at home status post laminectomy at [facility], PE, HFpEF, CKD Stage IIIa, DM Type 2, HTN, paroxsymal A.fib, Hypothyroidism. Of note, she was brought to the emergency department on 09/28/2022 because of bleeding from a PICC line on her right arm. She was brought from the nursing home to the emergency department because of hemoglobin of 6.8 on 10-25-2022.   Of note, details of recent fracture are as follows: She had a fall on 8/21 with associated neck pain and bilateral weakness. She was admitted at [facility] where CT showed C1-C1 fracture and a small subarachnoid hemorrhage. MRI obtained showed C1 and C2 fracture, ossification of posterior longitudinal ligament, cervical spondylosis, cervical spinal cord injury, central cord syndrome.  Initial plan by neuro surgery at [facility] was for posterior cervical decompression, instrumentation and fusion from C1-C6 to be done on 8/28, but it was felt she would be best served by medically stabilizing and correcting coagulopathy prior surgery.  However on 8/25 family requested she be transferred to [facility] for Neurosurgical intervention there.     She was admitted to the ICU for hypovolemic/hemorrhagic/septic shock and severe anemia.  She was treated with IV fluids, empiric IV antibiotics and IV vasopressors.  She was also transfused with 2 units of packed red blood cells.  Workup also showed that she had new C1 and C2 dislocation on top of recent C1 and C2 fractures with resultant severe spinal stenosis at C1-C2.  Dr. Katrinka Blazing, neurosurgeon, was consulted to assist with management.  He discussed the case with neurosurgeon at [facility] who recommended nonsurgical approach.   Assessment &  Plan:   Principal Problem:   Hemorrhagic shock (HCC) Active Problems:   Pulmonary hypertension (HCC)   CKD stage 3a, GFR 45-59 ml/min (HCC)   CAD (coronary artery disease)   Paroxysmal atrial fibrillation (HCC)   Chronic diastolic CHF (congestive heart failure) (HCC)   Closed dislocation of cervical vertebra   Pressure injury of skin   AKI (acute kidney injury) (HCC)   Acute blood loss anemia   Hyperkalemia   Hyponatremia   Pleural effusion on right  Hypovolemic/hemorrhagic/septic shock   Autonomic dysfunction in the setting of recent C1 and C2 spine fracture s/p laminectomy at [facility], now with worsening dislocation and significant spinal stenosis   Severe sepsis of unknown source   Acute blood loss anemia s/p transfusion with 2 units of PRBCs    Acute kidney injury on CKD stage IIIa   Acute metabolic encephalopathy   Type 2 diabetes mellitus   Right iliopsoas intramuscular hematoma   Chronic diastolic CHF   Pulmonary hypertension   Hyperkalemia   Hypoglycemia   Hyponatremia   Hypothyroidism   Small to moderate right pleural effusion without injury atelectasis  PLAN Family meeting held between 2 daughters in person, 1 son over the phone, palliative care NP on 10/4.  Family reaffirms her desire for aggressive medical intervention.  RD consulted for initiation of tube feeds.  Laboratory workup yesterday fairly reassuring.  Downtrending white count.  Stable hemoglobin.  Normal kidney function and electrolytes.  Will attempt to optimize as much as possible.  Continue PT OT SLP evaluations.   DVT prophylaxis: SCDs Code Status: DNR Family Communication: Family meeting in  person with 2 daughters at bedside and 1 son over phone 10/4 Disposition Plan: Status is: Inpatient Remains inpatient appropriate because: C1-C2 fracture.  Will need placement.   Level of care: Med-Surg  Consultants:  Palliative care  Procedures:   None  Antimicrobials: None   Subjective: Seen and examined.  Resting in bed.  Appears weak and deconditioned.  Appears fatigued.  Does answer questions appropriately.  Mentating intact.  Objective: Vitals:   10/07/22 1602 10/07/22 2021 10/08/22 0452 10/08/22 0824  BP: 125/77 118/70 123/68 127/77  Pulse: 97 86 88 97  Resp:  15 16 14   Temp: (!) 97.5 F (36.4 C) 97.8 F (36.6 C) 97.8 F (36.6 C) 97.7 F (36.5 C)  TempSrc:  Axillary Axillary Oral  SpO2: 100% 99% 100% 95%  Weight:        Intake/Output Summary (Last 24 hours) at 10/08/2022 0957 Last data filed at 10/08/2022 0900 Gross per 24 hour  Intake 1003.46 ml  Output 700 ml  Net 303.46 ml   Filed Weights   09/11/2022 0839 09/15/2022 2030 10/01/22 0702  Weight: 90.9 kg 86.6 kg 91.2 kg    Examination:  General exam: NAD.  Appears frail Respiratory system: Poor respiratory effort.  Scattered bibasilar crackles.  Normal work of breathing.  Room air Cardiovascular system: S1-S2, RRR, no murmurs, no pedal edema Gastrointestinal system: Soft NT/ND, normal bowel sounds Central nervous system: Alert.  Oriented times person.  No focal deficits Extremities: Markedly decreased power in all 4 extremities 0x5 Skin: No rashes, lesions or ulcers Psychiatry: Judgement and insight appear impaired. Mood & affect flattened.     Data Reviewed: I have personally reviewed following labs and imaging studies  CBC: Recent Labs  Lab 10/07/22 1917  WBC 12.6*  NEUTROABS 10.2*  HGB 7.7*  HCT 25.2*  MCV 92.0  PLT 316   Basic Metabolic Panel: Recent Labs  Lab 10/05/22 1344 10/07/22 1917  NA 136 137  K 4.6 3.6  CL 101 105  CO2 22 25  GLUCOSE 98 104*  BUN 25* 27*  CREATININE 0.69 0.62  CALCIUM 8.2* 8.3*   GFR: Estimated Creatinine Clearance: 63.9 mL/min (by C-G formula based on SCr of 0.62 mg/dL). Liver Function Tests: No results for input(s): "AST", "ALT", "ALKPHOS", "BILITOT", "PROT", "ALBUMIN" in the last 168 hours.  No  results for input(s): "LIPASE", "AMYLASE" in the last 168 hours. No results for input(s): "AMMONIA" in the last 168 hours. Coagulation Profile: No results for input(s): "INR", "PROTIME" in the last 168 hours.  Cardiac Enzymes: No results for input(s): "CKTOTAL", "CKMB", "CKMBINDEX", "TROPONINI" in the last 168 hours. BNP (last 3 results) No results for input(s): "PROBNP" in the last 8760 hours. HbA1C: No results for input(s): "HGBA1C" in the last 72 hours. CBG: Recent Labs  Lab 10/02/22 0326 10/02/22 0734 10/02/22 0808 10/02/22 0811 10/02/22 1118  GLUCAP 71 64* 63* 81 99   Lipid Profile: No results for input(s): "CHOL", "HDL", "LDLCALC", "TRIG", "CHOLHDL", "LDLDIRECT" in the last 72 hours. Thyroid Function Tests: No results for input(s): "TSH", "T4TOTAL", "FREET4", "T3FREE", "THYROIDAB" in the last 72 hours. Anemia Panel: No results for input(s): "VITAMINB12", "FOLATE", "FERRITIN", "TIBC", "IRON", "RETICCTPCT" in the last 72 hours. Sepsis Labs: Recent Labs  Lab 10/05/22 1344  PROCALCITON 0.28    Recent Results (from the past 240 hour(s))  Resp panel by RT-PCR (RSV, Flu A&B, Covid) Anterior Nasal Swab     Status: None   Collection Time: 09/25/2022  8:47 AM   Specimen: Anterior Nasal Swab  Result Value Ref Range Status   SARS Coronavirus 2 by RT PCR NEGATIVE NEGATIVE Final    Comment: (NOTE) SARS-CoV-2 target nucleic acids are NOT DETECTED.  The SARS-CoV-2 RNA is generally detectable in upper respiratory specimens during the acute phase of infection. The lowest concentration of SARS-CoV-2 viral copies this assay can detect is 138 copies/mL. A negative result does not preclude SARS-Cov-2 infection and should not be used as the sole basis for treatment or other patient management decisions. A negative result may occur with  improper specimen collection/handling, submission of specimen other than nasopharyngeal swab, presence of viral mutation(s) within the areas targeted  by this assay, and inadequate number of viral copies(<138 copies/mL). A negative result must be combined with clinical observations, patient history, and epidemiological information. The expected result is Negative.  Fact Sheet for Patients:  BloggerCourse.com  Fact Sheet for Healthcare Providers:  SeriousBroker.it  This test is no t yet approved or cleared by the [place] FDA and  has been authorized for detection and/or diagnosis of SARS-CoV-2 by FDA under an Emergency Use Authorization (EUA). This EUA will remain  in effect (meaning this test can be used) for the duration of the COVID-19 declaration under Section 564(b)(1) of the Act, 21 U.S.C.section 360bbb-3(b)(1), unless the authorization is terminated  or revoked sooner.       Influenza A by PCR NEGATIVE NEGATIVE Final   Influenza B by PCR NEGATIVE NEGATIVE Final    Comment: (NOTE) The Xpert Xpress SARS-CoV-2/FLU/RSV plus assay is intended as an aid in the diagnosis of influenza from Nasopharyngeal swab specimens and should not be used as a sole basis for treatment. Nasal washings and aspirates are unacceptable for Xpert Xpress SARS-CoV-2/FLU/RSV testing.  Fact Sheet for Patients: BloggerCourse.com  Fact Sheet for Healthcare Providers: SeriousBroker.it  This test is not yet approved or cleared by the [place] FDA and has been authorized for detection and/or diagnosis of SARS-CoV-2 by FDA under an Emergency Use Authorization (EUA). This EUA will remain in effect (meaning this test can be used) for the duration of the COVID-19 declaration under Section 564(b)(1) of the Act, 21 U.S.C. section 360bbb-3(b)(1), unless the authorization is terminated or revoked.     Resp Syncytial Virus by PCR NEGATIVE NEGATIVE Final    Comment: (NOTE) Fact Sheet for Patients: BloggerCourse.com  Fact  Sheet for Healthcare Providers: SeriousBroker.it  This test is not yet approved or cleared by the [place] FDA and has been authorized for detection and/or diagnosis of SARS-CoV-2 by FDA under an Emergency Use Authorization (EUA). This EUA will remain in effect (meaning this test can be used) for the duration of the COVID-19 declaration under Section 564(b)(1) of the Act, 21 U.S.C. section 360bbb-3(b)(1), unless the authorization is terminated or revoked.  Performed at [facility], [address] [place], [place] [address]   Blood Culture (routine x 2)     Status: None   Collection Time: 09/19/2022 10:22 AM   Specimen: BLOOD  Result Value Ref Range Status   Specimen Description BLOOD BLOOD LEFT ARM  Final   Special Requests   Final    BOTTLES DRAWN AEROBIC AND ANAEROBIC Blood Culture adequate volume   Culture   Final    NO GROWTH 5 DAYS Performed at [facility], [address] [place], [place] [address]    Report Status 10/05/2022 FINAL  Final  MRSA Next Gen by PCR, Nasal     Status: None   Collection Time: 09/28/2022  8:21 PM  Specimen: Nasal Mucosa; Nasal Swab  Result Value Ref Range Status   MRSA by PCR Next Gen NOT DETECTED NOT DETECTED Final    Comment: (NOTE) The GeneXpert MRSA Assay (FDA approved for NASAL specimens only), is one component of a comprehensive MRSA colonization surveillance program. It is not intended to diagnose MRSA infection nor to guide or monitor treatment for MRSA infections. Test performance is not FDA approved in patients less than 39 years old. Performed at [facility], [address], [place], [place] [address]          Radiology Studies: No results found.      Scheduled Meds:  Chlorhexidine Gluconate Cloth  6 each Topical Daily   DULoxetine  30 mg Oral Daily   ferrous sulfate  325 mg Oral Q breakfast   pantoprazole (PROTONIX) IV  40 mg Intravenous Q24H   Continuous  Infusions:   LOS: 8 days    Tresa Moore, MD [facility]   If 7PM-7AM, please contact night-coverage  10/08/2022, 9:57 AM

## 2022-10-09 DIAGNOSIS — R578 Other shock: Secondary | ICD-10-CM | POA: Diagnosis not present

## 2022-10-09 LAB — CBC WITH DIFFERENTIAL/PLATELET
Abs Immature Granulocytes: 0.27 10*3/uL — ABNORMAL HIGH (ref 0.00–0.07)
Basophils Absolute: 0.1 10*3/uL (ref 0.0–0.1)
Basophils Relative: 0 %
Eosinophils Absolute: 0.2 10*3/uL (ref 0.0–0.5)
Eosinophils Relative: 2 %
HCT: 24.1 % — ABNORMAL LOW (ref 36.0–46.0)
Hemoglobin: 7.8 g/dL — ABNORMAL LOW (ref 12.0–15.0)
Immature Granulocytes: 2 %
Lymphocytes Relative: 11 %
Lymphs Abs: 1.4 10*3/uL (ref 0.7–4.0)
MCH: 28.6 pg (ref 26.0–34.0)
MCHC: 32.4 g/dL (ref 30.0–36.0)
MCV: 88.3 fL (ref 80.0–100.0)
Monocytes Absolute: 0.6 10*3/uL (ref 0.1–1.0)
Monocytes Relative: 5 %
Neutro Abs: 10.1 10*3/uL — ABNORMAL HIGH (ref 1.7–7.7)
Neutrophils Relative %: 80 %
Platelets: 246 10*3/uL (ref 150–400)
RBC: 2.73 MIL/uL — ABNORMAL LOW (ref 3.87–5.11)
RDW: 19.8 % — ABNORMAL HIGH (ref 11.5–15.5)
Smear Review: UNDETERMINED
WBC: 12.7 10*3/uL — ABNORMAL HIGH (ref 4.0–10.5)
nRBC: 0.2 % (ref 0.0–0.2)

## 2022-10-09 LAB — BASIC METABOLIC PANEL
Anion gap: 8 (ref 5–15)
BUN: 26 mg/dL — ABNORMAL HIGH (ref 8–23)
CO2: 25 mmol/L (ref 22–32)
Calcium: 8.6 mg/dL — ABNORMAL LOW (ref 8.9–10.3)
Chloride: 105 mmol/L (ref 98–111)
Creatinine, Ser: 0.67 mg/dL (ref 0.44–1.00)
GFR, Estimated: >60 mL/min (ref >60)
Glucose, Bld: 189 mg/dL — ABNORMAL HIGH (ref 70–99)
Potassium: 4.9 mmol/L (ref 3.5–5.1)
Sodium: 138 mmol/L (ref 135–145)

## 2022-10-09 LAB — GLUCOSE, CAPILLARY
Glucose-Capillary: 105 mg/dL — ABNORMAL HIGH (ref 70–99)
Glucose-Capillary: 173 mg/dL — ABNORMAL HIGH (ref 70–99)
Glucose-Capillary: 173 mg/dL — ABNORMAL HIGH (ref 70–99)
Glucose-Capillary: 193 mg/dL — ABNORMAL HIGH (ref 70–99)
Glucose-Capillary: 86 mg/dL (ref 70–99)

## 2022-10-09 LAB — MAGNESIUM
Magnesium: 2 mg/dL (ref 1.7–2.4)
Magnesium: 1.9 mg/dL (ref 1.7–2.4)

## 2022-10-09 LAB — PHOSPHORUS
Phosphorus: 2.7 mg/dL (ref 2.5–4.6)
Phosphorus: 2.8 mg/dL (ref 2.5–4.6)

## 2022-10-09 NOTE — Progress Notes (Signed)
PROGRESS NOTE    Hannah [name]  ZOX:[id number] DOB: 27-Sep-1940 DOA: 10/30/2022 PCP: [name], MD    Brief Narrative:   82 y.o. female with past medical history significant for recent C1/C2 fracture and central cord syndrome approximately 1 month ago following a fall at home status post laminectomy at [facility], PE, HFpEF, CKD Stage IIIa, DM Type 2, HTN, paroxsymal A.fib, Hypothyroidism. Of note, she was brought to the emergency department on 09/28/2022 because of bleeding from a PICC line on her right arm. She was brought from the nursing home to the emergency department because of hemoglobin of 6.8 on 2022/10/30.    Of note, details of recent fracture are as follows: She had a fall on 8/21 with associated neck pain and bilateral weakness. She was admitted at [facility] where CT showed C1-C1 fracture and a small subarachnoid hemorrhage. MRI obtained showed C1 and C2 fracture, ossification of posterior longitudinal ligament, cervical spondylosis, cervical spinal cord injury, central cord syndrome.  Initial plan by neuro surgery at [facility] was for posterior cervical decompression, instrumentation and fusion from C1-C6 to be done on 8/28, but it was felt she would be best served by medically stabilizing and correcting coagulopathy prior surgery.  However on 8/25 family requested she be transferred to [facility] for Neurosurgical intervention there.     She was admitted to the ICU for hypovolemic/hemorrhagic/septic shock and severe anemia.  She was treated with IV fluids, empiric IV antibiotics and IV vasopressors.  She was also transfused with 2 units of packed red blood cells.  Workup also showed that she had new C1 and C2 dislocation on top of recent C1 and C2 fractures with resultant severe spinal stenosis at C1-C2.  Dr. Katrinka Blazing, neurosurgeon, was consulted to assist with management.  He discussed the case with neurosurgeon at [facility] who recommended nonsurgical approach.  Assessment &  Plan:   Principal Problem:   Hemorrhagic shock (HCC) Active Problems:   Pulmonary hypertension (HCC)   CKD stage 3a, GFR 45-59 ml/min (HCC)   CAD (coronary artery disease)   Paroxysmal atrial fibrillation (HCC)   Chronic diastolic CHF (congestive heart failure) (HCC)   Closed dislocation of cervical vertebra   Pressure injury of skin   AKI (acute kidney injury) (HCC)   Acute blood loss anemia   Hyperkalemia   Hyponatremia   Pleural effusion on right  Hypovolemic/hemorrhagic/septic shock   Autonomic dysfunction in the setting of recent C1 and C2 spine fracture s/p laminectomy at [facility], now with worsening dislocation and significant spinal stenosis   Severe sepsis of unknown source   Acute blood loss anemia s/p transfusion with 2 units of PRBCs    Acute kidney injury on CKD stage IIIa   Acute metabolic encephalopathy   Type 2 diabetes mellitus   Right iliopsoas intramuscular hematoma   Chronic diastolic CHF   Pulmonary hypertension   Hyperkalemia   Hypoglycemia   Hyponatremia   Hypothyroidism   Small to moderate right pleural effusion without injury atelectasis   PLAN Family meeting held between 2 daughters in person, 1 son over the phone, palliative care NP on 10/4.  Family reaffirms her desire for aggressive medical intervention.  Tube feeds have been initiated.  Laboratory workup done on 10/5 fairly reassuring.  Stable hemoglobin, downtrending white count, normal kidney function, stable electrolytes.  No indication of worsening right iliopsoas intramuscular hematoma.  Continue PT OT and SLP evaluations.  TOC following for placement options.    DVT prophylaxis: SQ  lovenox Code Status: DNR Family Communication: Daughter [name] [phone] on 10/6 Disposition Plan: Status is: Inpatient Remains inpatient appropriate because: Multiple acute issues as above   Level of care: Med-Surg  Consultants:  Palliative care  Procedures:   None  Antimicrobials: None   Subjective: Seen and examined.  Resting in bed.  Appears frail.  No visible distress.  Objective: Vitals:   10/08/22 1240 10/08/22 2055 10/09/22 0455 10/09/22 0500  BP: 139/73 (!) 114/53 (!) 104/48   Pulse: 96 94 93   Resp: 14 20 18    Temp: 97.9 F (36.6 C) (!) 97.5 F (36.4 C) (!) 97.4 F (36.3 C)   TempSrc: Oral Oral Oral   SpO2: 99% 100% 100%   Weight:    91.3 kg    Intake/Output Summary (Last 24 hours) at 10/09/2022 1052 Last data filed at 10/09/2022 0900 Gross per 24 hour  Intake --  Output 500 ml  Net -500 ml   Filed Weights   04-Oct-2022 2030 10/01/22 0702 10/09/22 0500  Weight: 86.6 kg 91.2 kg 91.3 kg    Examination:  General exam: No visible distress Respiratory system: Lungs clear.  Normal work of breathing.  Room air Cardiovascular system: S1-S2, RRR, no murmurs, no pedal edema Gastrointestinal system: Soft, NT/ND,+ ostomy+ PEG Central nervous system: Alert.  Oriented x 1.  No focal deficits Extremities: 0 X5 power bilateral upper and lower extremities Skin: No rashes, lesions or ulcers Psychiatry: Judgement and insight appear impaired. Mood & affect flattened.     Data Reviewed: I have personally reviewed following labs and imaging studies  CBC: Recent Labs  Lab 10/07/22 1917  WBC 12.6*  NEUTROABS 10.2*  HGB 7.7*  HCT 25.2*  MCV 92.0  PLT 316   Basic Metabolic Panel: Recent Labs  Lab 10/05/22 1344 10/07/22 1917 10/08/22 1757 10/09/22 0523  NA 136 137  --   --   K 4.6 3.6  --   --   CL 101 105  --   --   CO2 22 25  --   --   GLUCOSE 98 104*  --   --   BUN 25* 27*  --   --   CREATININE 0.69 0.62  --   --   CALCIUM 8.2* 8.3*  --   --   MG  --   --  1.9 2.0  PHOS  --   --  2.9 2.7   GFR: Estimated Creatinine Clearance: 64 mL/min (by C-G formula based on SCr of 0.62 mg/dL). Liver Function Tests: No results for input(s): "AST", "ALT", "ALKPHOS", "BILITOT", "PROT", "ALBUMIN" in the last 168 hours. No  results for input(s): "LIPASE", "AMYLASE" in the last 168 hours. No results for input(s): "AMMONIA" in the last 168 hours. Coagulation Profile: No results for input(s): "INR", "PROTIME" in the last 168 hours. Cardiac Enzymes: No results for input(s): "CKTOTAL", "CKMB", "CKMBINDEX", "TROPONINI" in the last 168 hours. BNP (last 3 results) No results for input(s): "PROBNP" in the last 8760 hours. HbA1C: No results for input(s): "HGBA1C" in the last 72 hours. CBG: Recent Labs  Lab 10/02/22 1118 10/08/22 2056 10/09/22 0012 10/09/22 0458  GLUCAP 99 88 86 105*   Lipid Profile: No results for input(s): "CHOL", "HDL", "LDLCALC", "TRIG", "CHOLHDL", "LDLDIRECT" in the last 72 hours. Thyroid Function Tests: No results for input(s): "TSH", "T4TOTAL", "FREET4", "T3FREE", "THYROIDAB" in the last 72 hours. Anemia Panel: No results for input(s): "VITAMINB12", "FOLATE", "FERRITIN", "TIBC", "IRON", "RETICCTPCT" in the last 72 hours. Sepsis Labs: Recent Labs  Lab 10/05/22 1344  PROCALCITON 0.28    Recent Results (from the past 240 hour(s))  Resp panel by RT-PCR (RSV, Flu A&B, Covid) Anterior Nasal Swab     Status: None   Collection Time: 09/17/2022  8:47 AM   Specimen: Anterior Nasal Swab  Result Value Ref Range Status   SARS Coronavirus 2 by RT PCR NEGATIVE NEGATIVE Final    Comment: (NOTE) SARS-CoV-2 target nucleic acids are NOT DETECTED.  The SARS-CoV-2 RNA is generally detectable in upper respiratory specimens during the acute phase of infection. The lowest concentration of SARS-CoV-2 viral copies this assay can detect is 138 copies/mL. A negative result does not preclude SARS-Cov-2 infection and should not be used as the sole basis for treatment or other patient management decisions. A negative result may occur with  improper specimen collection/handling, submission of specimen other than nasopharyngeal swab, presence of viral mutation(s) within the areas targeted by this assay, and  inadequate number of viral copies(<138 copies/mL). A negative result must be combined with clinical observations, patient history, and epidemiological information. The expected result is Negative.  Fact Sheet for Patients:  BloggerCourse.com  Fact Sheet for Healthcare Providers:  SeriousBroker.it  This test is no t yet approved or cleared by the [place] FDA and  has been authorized for detection and/or diagnosis of SARS-CoV-2 by FDA under an Emergency Use Authorization (EUA). This EUA will remain  in effect (meaning this test can be used) for the duration of the COVID-19 declaration under Section 564(b)(1) of the Act, 21 U.S.C.section 360bbb-3(b)(1), unless the authorization is terminated  or revoked sooner.       Influenza A by PCR NEGATIVE NEGATIVE Final   Influenza B by PCR NEGATIVE NEGATIVE Final    Comment: (NOTE) The Xpert Xpress SARS-CoV-2/FLU/RSV plus assay is intended as an aid in the diagnosis of influenza from Nasopharyngeal swab specimens and should not be used as a sole basis for treatment. Nasal washings and aspirates are unacceptable for Xpert Xpress SARS-CoV-2/FLU/RSV testing.  Fact Sheet for Patients: BloggerCourse.com  Fact Sheet for Healthcare Providers: SeriousBroker.it  This test is not yet approved or cleared by the [place] FDA and has been authorized for detection and/or diagnosis of SARS-CoV-2 by FDA under an Emergency Use Authorization (EUA). This EUA will remain in effect (meaning this test can be used) for the duration of the COVID-19 declaration under Section 564(b)(1) of the Act, 21 U.S.C. section 360bbb-3(b)(1), unless the authorization is terminated or revoked.     Resp Syncytial Virus by PCR NEGATIVE NEGATIVE Final    Comment: (NOTE) Fact Sheet for Patients: BloggerCourse.com  Fact Sheet for Healthcare  Providers: SeriousBroker.it  This test is not yet approved or cleared by the [place] FDA and has been authorized for detection and/or diagnosis of SARS-CoV-2 by FDA under an Emergency Use Authorization (EUA). This EUA will remain in effect (meaning this test can be used) for the duration of the COVID-19 declaration under Section 564(b)(1) of the Act, 21 U.S.C. section 360bbb-3(b)(1), unless the authorization is terminated or revoked.  Performed at [facility], [address] [place], [place] [address]   Blood Culture (routine x 2)     Status: None   Collection Time: 09/29/2022 10:22 AM   Specimen: BLOOD  Result Value Ref Range Status   Specimen Description BLOOD BLOOD LEFT ARM  Final   Special Requests   Final    BOTTLES DRAWN AEROBIC AND ANAEROBIC Blood Culture adequate volume   Culture   Final  NO GROWTH 5 DAYS Performed at [facility], [address] [place], [place] [address]    Report Status 10/05/2022 FINAL  Final  MRSA Next Gen by PCR, Nasal     Status: None   Collection Time: 10-17-22  8:21 PM   Specimen: Nasal Mucosa; Nasal Swab  Result Value Ref Range Status   MRSA by PCR Next Gen NOT DETECTED NOT DETECTED Final    Comment: (NOTE) The GeneXpert MRSA Assay (FDA approved for NASAL specimens only), is one component of a comprehensive MRSA colonization surveillance program. It is not intended to diagnose MRSA infection nor to guide or monitor treatment for MRSA infections. Test performance is not FDA approved in patients less than 68 years old. Performed at [facility], [address] [place], [place] [address]          Radiology Studies: No results found.      Scheduled Meds:  Chlorhexidine Gluconate Cloth  6 each Topical Daily   DULoxetine  30 mg Oral Daily   enoxaparin (LOVENOX) injection  40 mg Subcutaneous Q24H   feeding supplement (PROSource TF20)  60 mL Per Tube Daily   ferrous  sulfate  325 mg Oral Q breakfast   pantoprazole (PROTONIX) IV  40 mg Intravenous Q24H   Continuous Infusions:  feeding supplement (OSMOLITE 1.5 CAL) 1,000 mL (10/08/22 2055)     LOS: 9 days       Tresa Moore, MD [facility]   If 7PM-7AM, please contact night-coverage  10/09/2022, 10:52 AM

## 2022-10-09 NOTE — Plan of Care (Signed)
  Problem: Self-Care: Goal: Ability to communicate needs accurately will improve Outcome: Progressing   Problem: Nutrition: Goal: Risk of aspiration will decrease Outcome: Progressing Goal: Dietary intake will improve Outcome: Progressing

## 2022-10-10 DIAGNOSIS — R578 Other shock: Secondary | ICD-10-CM | POA: Diagnosis not present

## 2022-10-10 LAB — GLUCOSE, CAPILLARY
Glucose-Capillary: 142 mg/dL — ABNORMAL HIGH (ref 70–99)
Glucose-Capillary: 161 mg/dL — ABNORMAL HIGH (ref 70–99)
Glucose-Capillary: 164 mg/dL — ABNORMAL HIGH (ref 70–99)
Glucose-Capillary: 197 mg/dL — ABNORMAL HIGH (ref 70–99)
Glucose-Capillary: 198 mg/dL — ABNORMAL HIGH (ref 70–99)

## 2022-10-10 LAB — MAGNESIUM: Magnesium: 1.9 mg/dL (ref 1.7–2.4)

## 2022-10-10 LAB — PHOSPHORUS: Phosphorus: 2.5 mg/dL (ref 2.5–4.6)

## 2022-10-10 MED ORDER — SODIUM CHLORIDE 0.9 % IV SOLN: INTRAVENOUS | Status: DC

## 2022-10-10 NOTE — Care Management Important Message (Signed)
Important Message  Patient Details  Name: Hannah [name] MRN: [id number] Date of Birth: Nov 07, 1940   Important Message Given:  Yes - Medicare IM     Emori Kamau, Stephan Minister 10/10/2022, 3:24 PM

## 2022-10-10 NOTE — Progress Notes (Signed)
Physical Therapy Treatment Patient Details Name: Hannah [name] MRN: [id number] DOB: 1941/01/01 Today's Date: 10/10/2022   History of Present Illness Pt. is baseline nonverbal, diabetic, CHF, a-fib. Complicated patient with C1-C2 fracture following a fall and prolonged hospitalization at [facility], recently discharged to [facility] now admitted with multi-factorial shock and altered mental status. CT on admission reveals known recent C1 and C2 fractures with increased fracture  displacement and angulation including new complete posterior  dislocation of the lateral masses of C1 relative to C2. Resultant  severe spinal stenosis at C1-2. Per secure chat with Dr. Katrinka Blazing Neurosurgery states no further tx planned, ordering soft collar.    PT Comments  Pt received in handoff from OT session in flat supine position, tube feeds on hold. Attempted stretching and completion of PROM to LE joints through all available planes. Pt however limited due to c/o pain with facial grimacing noted. No active movement noted except in Right great toe, sensation varies. Attempted to return pt to upright long sitting, pt with c/o increased back pain only tolerated HOB raised at 10 degrees. Continued to hold tube feeds due to aspiration concerns, nursing notified. Limited progress due to severity of spinal involvement and pain.    If plan is discharge home, recommend the following: Two people to help with walking and/or transfers;Two people to help with bathing/dressing/bathroom   Can travel by private vehicle     No  Equipment Recommendations  None recommended by PT    Recommendations for Other Services       Precautions / Restrictions Precautions Precautions: Cervical Precaution Comments: C1-2 fracture and disclocation. Conservative management Required Braces or Orthoses: Cervical Brace Cervical Brace: Soft collar Restrictions Weight Bearing Restrictions: No     Mobility  Bed Mobility                     Transfers                        Ambulation/Gait                   Stairs             Wheelchair Mobility     Tilt Bed    Modified Rankin (Stroke Patients Only)       Balance                                            Cognition Arousal: Alert Behavior During Therapy: Flat affect Overall Cognitive Status: No family/caregiver present to determine baseline cognitive functioning                                          Exercises General Exercises - Lower Extremity Ankle Circles/Pumps: PROM, Both, 15 reps, Supine Heel Slides: PROM, Both, 10 reps, Supine Hip ABduction/ADduction: PROM, Both, 10 reps, Supine Other Exercises Other Exercises: B LE gentle prolonged stretches through all available planes and pt tolerance    General Comments General comments (skin integrity, edema, etc.): R UE edema noted      Pertinent Vitals/Pain Pain Assessment Pain Assessment: Faces Faces Pain Scale: Hurts little more Facial Expression: Grimacing Pain Location: Legs with movement in bed Pain Descriptors / Indicators: Grimacing, Guarding Pain Intervention(s): Limited activity within patient's  tolerance    Home Living Family/patient expects to be discharged to:: Skilled nursing facility                        Prior Function            PT Goals (current goals can now be found in the care plan section) Acute Rehab PT Goals Patient Stated Goal: none stated    Frequency    Min 1X/week      PT Plan      Co-evaluation              AM-PAC PT "6 Clicks" Mobility   Outcome Measure  Help needed turning from your back to your side while in a flat bed without using bedrails?: Total Help needed moving from lying on your back to sitting on the side of a flat bed without using bedrails?: Total Help needed moving to and from a bed to a chair (including a wheelchair)?: Total Help needed standing up from a  chair using your arms (e.g., wheelchair or bedside chair)?: Total Help needed to walk in hospital room?: Total Help needed climbing 3-5 steps with a railing? : Total 6 Click Score: 6    End of Session Equipment Utilized During Treatment: Cervical collar Activity Tolerance: Patient limited by pain Patient left: in bed;with call bell/phone within reach;with nursing/sitter in room Nurse Communication: Mobility status (Poor tolerance for elevated HOB, Nursing notified due to inability to re-start G-Tube Feedings) PT Visit Diagnosis: Other abnormalities of gait and mobility (R26.89);Muscle weakness (generalized) (M62.81);Adult, failure to thrive (R62.7)     Time: 2706-2376 PT Time Calculation (min) (ACUTE ONLY): 11 min  Charges:    $Therapeutic Exercise: 8-22 mins PT General Charges $$ ACUTE PT VISIT: 1 Visit                    Hannah [name], PTA  [name] 10/10/2022, 4:12 PM

## 2022-10-10 NOTE — Progress Notes (Signed)
Occupational Therapy Treatment Patient Details Name: Hannah [name] MRN: [id number] DOB: [birth year] Today's Date: 10/10/2022   History of present illness Pt. is baseline nonverbal, diabetic, CHF, a-fib. Complicated patient with C1-C2 fracture following a fall and prolonged hospitalization at [facility], recently discharged to [facility] now admitted with multi-factorial shock and altered mental status. CT on admission reveals known recent C1 and C2 fractures with increased fracture  displacement and angulation including new complete posterior  dislocation of the lateral masses of C1 relative to C2. Resultant  severe spinal stenosis at C1-2. Per secure chat with Dr. Katrinka Blazing Neurosurgery states no further tx planned, ordering soft collar.   OT comments  Upon entering the room, pt supine in bed with soft collar donned but neck very far from neutral position with R ear almost touching shoulder. Total A to reposition soft collar into appropriate position but pt becomes very tearful with repositioning. R UE noted have significant edema this session limiting PROM. Pt able to perform limited elbow extension in gravity eliminated position and tolerates 10 reps of elbow flexion/ext for hand to mouth movements but pt grimaces with all movement of UEs. Pt is handed off to PTA for PT session with pt not appearing in any distress at this time but making limited progress secondary to severity of deficits at this time.       If plan is discharge home, recommend the following:  Two people to help with walking and/or transfers;Two people to help with bathing/dressing/bathroom;Assistance with cooking/housework;Assist for transportation;Help with stairs or ramp for entrance;Direct supervision/assist for financial management;Direct supervision/assist for medications management   Equipment Recommendations  Other (comment) (defer to next venue of care)       Precautions / Restrictions Precautions Precautions: Cervical Precaution  Comments: C1-2 fracture and disclocation. Conservative management Required Braces or Orthoses: Cervical Brace Cervical Brace: Soft collar Restrictions Weight Bearing Restrictions: No              ADL either performed or assessed with clinical judgement   ADL                                         General ADL Comments: total A of 2 for all self care.     Vision Patient Visual Report: No change from baseline            Cognition Arousal: Alert Behavior During Therapy: Flat affect Overall Cognitive Status: No family/caregiver present to determine baseline cognitive functioning                                                     Pertinent Vitals/ Pain       Pain Assessment Pain Assessment: No/denies pain  Home Living Family/patient expects to be discharged to:: Skilled nursing facility                                            Frequency  Min 1X/week        Progress Toward Goals  OT Goals(current goals can now be found in the care plan section)  Progress towards OT goals: Progressing toward goals  AM-PAC OT "6 Clicks" Daily Activity     Outcome Measure   Help from another person eating meals?: Total Help from another person taking care of personal grooming?: Total Help from another person toileting, which includes using toliet, bedpan, or urinal?: Total Help from another person bathing (including washing, rinsing, drying)?: Total Help from another person to put on and taking off regular upper body clothing?: Total Help from another person to put on and taking off regular lower body clothing?: Total 6 Click Score: 6    End of Session Equipment Utilized During Treatment: Cervical collar  OT Visit Diagnosis: History of falling (Z91.81);Muscle weakness (generalized) (M62.81);Pain;Unsteadiness on feet (R26.81)   Activity Tolerance Patient limited by pain   Patient Left in bed;with call  bell/phone within reach;with bed alarm set;Other (comment) (handoff to PTA)   Nurse Communication Mobility status        Time: 1435-1450 OT Time Calculation (min): 15 min  Charges: OT General Charges $OT Visit: 1 Visit OT Treatments $Therapeutic Activity: 8-22 mins  Jackquline Denmark, MS, OTR/L , CBIS ascom [phone]  10/10/22, 3:41 PM

## 2022-10-10 NOTE — Plan of Care (Signed)
  Problem: Education: Goal: Understanding of CV disease, CV risk reduction, and recovery process will improve Outcome: Progressing   Problem: Activity: Goal: Ability to return to baseline activity level will improve Outcome: Progressing   Problem: Cardiovascular: Goal: Ability to achieve and maintain adequate cardiovascular perfusion will improve Outcome: Progressing   Problem: Health Behavior/Discharge Planning: Goal: Ability to safely manage health-related needs after discharge will improve Outcome: Progressing   Problem: Education: Goal: Knowledge of disease or condition will improve Outcome: Progressing Goal: Knowledge of secondary prevention will improve (MUST DOCUMENT ALL) Outcome: Progressing Goal: Knowledge of patient specific risk factors will improve Loraine Leriche N/A or DELETE if not current risk factor) Outcome: Progressing

## 2022-10-10 NOTE — Progress Notes (Signed)
PROGRESS NOTE    Hannah [name]  XFG:[id number] DOB: 1940/10/27 DOA: 09/27/2022 PCP: [name], MD    Brief Narrative:   82 y.o. female with past medical history significant for recent C1/C2 fracture and central cord syndrome approximately 1 month ago following a fall at home status post laminectomy at [facility], PE, HFpEF, CKD Stage IIIa, DM Type 2, HTN, paroxsymal A.fib, Hypothyroidism. Of note, she was brought to the emergency department on 09/28/2022 because of bleeding from a PICC line on her right arm. She was brought from the nursing home to the emergency department because of hemoglobin of 6.8 on 09/08/2022.    Of note, details of recent fracture are as follows: She had a fall on 8/21 with associated neck pain and bilateral weakness. She was admitted at [facility] where CT showed C1-C1 fracture and a small subarachnoid hemorrhage. MRI obtained showed C1 and C2 fracture, ossification of posterior longitudinal ligament, cervical spondylosis, cervical spinal cord injury, central cord syndrome.  Initial plan by neuro surgery at [facility] was for posterior cervical decompression, instrumentation and fusion from C1-C6 to be done on 8/28, but it was felt she would be best served by medically stabilizing and correcting coagulopathy prior surgery.  However on 8/25 family requested she be transferred to [facility] for Neurosurgical intervention there.     She was admitted to the ICU for hypovolemic/hemorrhagic/septic shock and severe anemia.  She was treated with IV fluids, empiric IV antibiotics and IV vasopressors.  She was also transfused with 2 units of packed red blood cells.  Workup also showed that she had new C1 and C2 dislocation on top of recent C1 and C2 fractures with resultant severe spinal stenosis at C1-C2.  Dr. Katrinka Blazing, neurosurgeon, was consulted to assist with management.  He discussed the case with neurosurgeon at [facility] who recommended nonsurgical approach.  Assessment &  Plan:   Principal Problem:   Hemorrhagic shock (HCC) Active Problems:   Pulmonary hypertension (HCC)   CKD stage 3a, GFR 45-59 ml/min (HCC)   CAD (coronary artery disease)   Paroxysmal atrial fibrillation (HCC)   Chronic diastolic CHF (congestive heart failure) (HCC)   Closed dislocation of cervical vertebra   Pressure injury of skin   AKI (acute kidney injury) (HCC)   Acute blood loss anemia   Hyperkalemia   Hyponatremia   Pleural effusion on right  Hypovolemic/hemorrhagic/septic shock   Autonomic dysfunction in the setting of recent C1 and C2 spine fracture s/p laminectomy at [facility], now with worsening dislocation and significant spinal stenosis   Severe sepsis of unknown source   Acute blood loss anemia s/p transfusion with 2 units of PRBCs    Acute kidney injury on CKD stage IIIa   Acute metabolic encephalopathy   Type 2 diabetes mellitus   Right iliopsoas intramuscular hematoma   Chronic diastolic CHF   Pulmonary hypertension   Hyperkalemia   Hypoglycemia   Hyponatremia   Hypothyroidism   Small to moderate right pleural effusion without injury atelectasis   PLAN Family meeting held between 2 daughters in person, 1 son over the phone, palliative care NP on 10/4.  Family reaffirms their desire for aggressive medical intervention.  Tube feeds have been initiated.  Laboratory workup done on 10/5 fairly reassuring.  Stable hemoglobin, downtrending white count, normal kidney function, stable electrolytes.  No indication of worsening right iliopsoas intramuscular hematoma.  Continue PT OT and SLP evaluations.  TOC following for placement options.    DVT prophylaxis: SQ  lovenox Code Status: DNR Family Communication: Daughter [name] [phone] on 10/6 Disposition Plan: Status is: Inpatient Remains inpatient appropriate because: Multiple acute issues as above   Level of care: Med-Surg  Consultants:  Palliative care  Procedures:   None  Antimicrobials: None   Subjective: Seen and examined.  Appears fatigued.  Objective: Vitals:   10/09/22 1552 10/09/22 2037 10/10/22 0500 10/10/22 0754  BP: (!) 120/50 (!) 95/58  111/66  Pulse: 81 80  75  Resp: 16   15  Temp: (!) 97.4 F (36.3 C) (!) 97.4 F (36.3 C)  97.6 F (36.4 C)  TempSrc:  Oral  Oral  SpO2: 100% 100%  96%  Weight:   91.5 kg     Intake/Output Summary (Last 24 hours) at 10/10/2022 1122 Last data filed at 10/10/2022 0500 Gross per 24 hour  Intake 332 ml  Output 100 ml  Net 232 ml   Filed Weights   10/01/22 0702 10/09/22 0500 10/10/22 0500  Weight: 91.2 kg 91.3 kg 91.5 kg    Examination:  General exam: NAD.  Appears fatigued Respiratory system: Lungs clear.  Normal work of breathing.  Room air Cardiovascular system: S1-S2, RRR, no murmurs, no pedal edema Gastrointestinal system: Soft, NT/ND,+ ostomy+ PEG Central nervous system: Alert.  Oriented x 1.  No focal deficits Extremities: 1 X5 power bilateral upper and lower extremities Skin: No rashes, lesions or ulcers Psychiatry: Judgement and insight appear impaired. Mood & affect flattened.     Data Reviewed: I have personally reviewed following labs and imaging studies  CBC: Recent Labs  Lab 10/07/22 1917 10/09/22 1324  WBC 12.6* 12.7*  NEUTROABS 10.2* 10.1*  HGB 7.7* 7.8*  HCT 25.2* 24.1*  MCV 92.0 88.3  PLT 316 246   Basic Metabolic Panel: Recent Labs  Lab 10/05/22 1344 10/07/22 1917 10/08/22 1757 10/09/22 0523 10/09/22 1324 10/09/22 1726 10/10/22 0519  NA 136 137  --   --  138  --   --   K 4.6 3.6  --   --  4.9  --   --   CL 101 105  --   --  105  --   --   CO2 22 25  --   --  25  --   --   GLUCOSE 98 104*  --   --  189*  --   --   BUN 25* 27*  --   --  26*  --   --   CREATININE 0.69 0.62  --   --  0.67  --   --   CALCIUM 8.2* 8.3*  --   --  8.6*  --   --   MG  --   --  1.9 2.0  --  1.9 1.9  PHOS  --   --  2.9 2.7 2.8  --  2.5   GFR: Estimated Creatinine  Clearance: 64.1 mL/min (by C-G formula based on SCr of 0.67 mg/dL). Liver Function Tests: No results for input(s): "AST", "ALT", "ALKPHOS", "BILITOT", "PROT", "ALBUMIN" in the last 168 hours. No results for input(s): "LIPASE", "AMYLASE" in the last 168 hours. No results for input(s): "AMMONIA" in the last 168 hours. Coagulation Profile: No results for input(s): "INR", "PROTIME" in the last 168 hours. Cardiac Enzymes: No results for input(s): "CKTOTAL", "CKMB", "CKMBINDEX", "TROPONINI" in the last 168 hours. BNP (last 3 results) No results for input(s): "PROBNP" in the last 8760 hours. HbA1C: No results for input(s): "HGBA1C" in the last 72 hours. CBG: Recent  Labs  Lab 10/09/22 1123 10/09/22 1554 10/09/22 2113 10/10/22 0022 10/10/22 0754  GLUCAP 173* 193* 173* 164* 161*   Lipid Profile: No results for input(s): "CHOL", "HDL", "LDLCALC", "TRIG", "CHOLHDL", "LDLDIRECT" in the last 72 hours. Thyroid Function Tests: No results for input(s): "TSH", "T4TOTAL", "FREET4", "T3FREE", "THYROIDAB" in the last 72 hours. Anemia Panel: No results for input(s): "VITAMINB12", "FOLATE", "FERRITIN", "TIBC", "IRON", "RETICCTPCT" in the last 72 hours. Sepsis Labs: Recent Labs  Lab 10/05/22 1344  PROCALCITON 0.28    Recent Results (from the past 240 hour(s))  MRSA Next Gen by PCR, Nasal     Status: None   Collection Time: 09/04/2022  8:21 PM   Specimen: Nasal Mucosa; Nasal Swab  Result Value Ref Range Status   MRSA by PCR Next Gen NOT DETECTED NOT DETECTED Final    Comment: (NOTE) The GeneXpert MRSA Assay (FDA approved for NASAL specimens only), is one component of a comprehensive MRSA colonization surveillance program. It is not intended to diagnose MRSA infection nor to guide or monitor treatment for MRSA infections. Test performance is not FDA approved in patients less than 63 years old. Performed at [facility], [address] [place], [place] [address]           Radiology Studies: No results found.      Scheduled Meds:  Chlorhexidine Gluconate Cloth  6 each Topical Daily   DULoxetine  30 mg Oral Daily   enoxaparin (LOVENOX) injection  40 mg Subcutaneous Q24H   feeding supplement (PROSource TF20)  60 mL Per Tube Daily   ferrous sulfate  325 mg Oral Q breakfast   pantoprazole (PROTONIX) IV  40 mg Intravenous Q24H   Continuous Infusions:  sodium chloride     feeding supplement (OSMOLITE 1.5 CAL) 1,000 mL (10/09/22 2042)     LOS: 10 days       Tresa Moore, MD [facility]   If 7PM-7AM, please contact night-coverage  10/10/2022, 11:22 AM

## 2022-10-11 DIAGNOSIS — R578 Other shock: Secondary | ICD-10-CM | POA: Diagnosis not present

## 2022-10-11 LAB — BASIC METABOLIC PANEL
Anion gap: 3 — ABNORMAL LOW (ref 5–15)
BUN: 26 mg/dL — ABNORMAL HIGH (ref 8–23)
CO2: 27 mmol/L (ref 22–32)
Calcium: 8.2 mg/dL — ABNORMAL LOW (ref 8.9–10.3)
Chloride: 109 mmol/L (ref 98–111)
Creatinine, Ser: 0.52 mg/dL (ref 0.44–1.00)
GFR, Estimated: >60 mL/min (ref >60)
Glucose, Bld: 220 mg/dL — ABNORMAL HIGH (ref 70–99)
Potassium: 4.9 mmol/L (ref 3.5–5.1)
Sodium: 139 mmol/L (ref 135–145)

## 2022-10-11 LAB — GLUCOSE, CAPILLARY
Glucose-Capillary: 208 mg/dL — ABNORMAL HIGH (ref 70–99)
Glucose-Capillary: 192 mg/dL — ABNORMAL HIGH (ref 70–99)
Glucose-Capillary: 208 mg/dL — ABNORMAL HIGH (ref 70–99)
Glucose-Capillary: 183 mg/dL — ABNORMAL HIGH (ref 70–99)
Glucose-Capillary: 178 mg/dL — ABNORMAL HIGH (ref 70–99)
Glucose-Capillary: 143 mg/dL — ABNORMAL HIGH (ref 70–99)

## 2022-10-11 LAB — MAGNESIUM: Magnesium: 1.8 mg/dL (ref 1.7–2.4)

## 2022-10-11 MED ORDER — INSULIN ASPART 100 UNIT/ML IJ SOLN
0.0000 [IU] | INTRAMUSCULAR | Status: DC
  Administered 2022-10-11: 3 [IU] via SUBCUTANEOUS
  Administered 2022-10-11 (×2): 2 [IU] via SUBCUTANEOUS
  Administered 2022-10-11 – 2022-10-13 (×6): 1 [IU] via SUBCUTANEOUS
  Administered 2022-10-13: 2 [IU] via SUBCUTANEOUS
  Administered 2022-10-13: 1 [IU] via SUBCUTANEOUS
  Administered 2022-10-13 – 2022-10-15 (×2): 2 [IU] via SUBCUTANEOUS
  Administered 2022-10-15: 5 [IU] via SUBCUTANEOUS
  Filled 2022-10-11 (×13): qty 1

## 2022-10-11 MED ORDER — FREE WATER
100.0000 mL | Status: DC
  Administered 2022-10-11 – 2022-10-15 (×23): 100 mL

## 2022-10-11 MED ORDER — SODIUM CHLORIDE 0.9% FLUSH
3.0000 mL | Freq: Two times a day (BID) | INTRAVENOUS | Status: DC
  Administered 2022-10-11 – 2022-10-15 (×9): 3 mL via INTRAVENOUS

## 2022-10-11 MED ORDER — JUVEN PO PACK
1.0000 | PACK | Freq: Two times a day (BID) | ORAL | Status: DC
  Administered 2022-10-12 – 2022-10-15 (×7): 1

## 2022-10-11 MED ORDER — GLUCERNA 1.5 CAL PO LIQD
1000.0000 mL | ORAL | Status: DC
  Administered 2022-10-11: 1000 mL

## 2022-10-11 NOTE — Progress Notes (Signed)
Occupational Therapy Treatment Patient Details Name: Charlsie Venturi MRN: [id number] DOB: 10-26-1940 Today's Date: 10/11/2022   History of present illness Pt. is baseline nonverbal, diabetic, CHF, a-fib. Complicated patient with C1-C2 fracture following a fall and prolonged hospitalization at [facility], recently discharged to [facility] now admitted with multi-factorial shock and altered mental status. CT on admission reveals known recent C1 and C2 fractures with increased fracture  displacement and angulation including new complete posterior  dislocation of the lateral masses of C1 relative to C2. Resultant  severe spinal stenosis at C1-2. Per secure chat with Dr. Katrinka Blazing Neurosurgery states no further tx planned, ordering soft collar.   OT comments  Pt. awake with nursing in working with the Pt. upon arrival. Pt. presents with increased RUE edema. Pt. tolerated PROM/AAROM in all joint ranges of the bilateral UE's. Trace bilateral active hand digit flexion, and extension elicited. No consistent active UE movement elicited this afternoon.  Assisted nursing with positioning BUEs. Pt. continues to benefit from OT services for ADL training, UE ROM, positioning, and pt./caregiver Education.        If plan is discharge home, recommend the following:  Two people to help with walking and/or transfers;Two people to help with bathing/dressing/bathroom;Assistance with cooking/housework;Assist for transportation;Help with stairs or ramp for entrance;Direct supervision/assist for financial management;Direct supervision/assist for medications management   Equipment Recommendations       Recommendations for Other Services      Precautions / Restrictions Precautions Precautions: Cervical Precaution Comments: C1-2 fracture and disclocation. Conservative management Required Braces or Orthoses: Cervical Brace Cervical Brace: Soft collar Restrictions Weight Bearing Restrictions: No       Mobility Bed Mobility      Rolling: Total assist, +2 for physical assistance              Transfers                         Balance                                           ADL either performed or assessed with clinical judgement   ADL  Total A                                            Extremity/Trunk Assessment Upper Extremity Assessment RUE Deficits / Details: Trace bilateral digit flexion/extesion. No consistent active ROM elicited upon command.            Vision Patient Visual Report: No change from baseline     Perception     Praxis      Cognition Arousal: Alert Behavior During Therapy: Flat affect Overall Cognitive Status: No family/caregiver present to determine baseline cognitive functioning                                          Exercises      Shoulder Instructions       General Comments      Pertinent Vitals/ Pain       Pain Assessment Pain Assessment: No/denies pain  Home Living  Prior Functioning/Environment              Frequency  Min 1X/week        Progress Toward Goals  OT Goals(current goals can now be found in the care plan section)     Acute Rehab OT Goals Patient Stated Goal: To go to rehab OT Goal Formulation: With patient/family Time For Goal Achievement: 10/19/22 Potential to Achieve Goals: Fair  Plan      Co-evaluation                 AM-PAC OT "6 Clicks" Daily Activity     Outcome Measure   Help from another person eating meals?: Total Help from another person taking care of personal grooming?: Total Help from another person toileting, which includes using toliet, bedpan, or urinal?: Total Help from another person bathing (including washing, rinsing, drying)?: Total Help from another person to put on and taking off regular upper body clothing?: Total Help from another person to put on and  taking off regular lower body clothing?: Total 6 Click Score: 6    End of Session Equipment Utilized During Treatment: Cervical collar  OT Visit Diagnosis: History of falling (Z91.81);Muscle weakness (generalized) (M62.81);Pain;Unsteadiness on feet (R26.81)   Activity Tolerance Patient limited by pain   Patient Left in bed;with call bell/phone within reach;with bed alarm set;Other (comment)   Nurse Communication Mobility status        Time: 4098-1191 OT Time Calculation (min): 28 min  Charges: OT General Charges $OT Visit: 1 Visit OT Treatments $Self Care/Home Management : 23-37 mins  Olegario Messier, MS, OTR/L   Olegario Messier 10/11/2022, 4:37 PM

## 2022-10-11 NOTE — Progress Notes (Signed)
PROGRESS NOTE    Hannah [name]  GNF:[id number] DOB: [birth year] DOA: 09/08/2022 PCP: [name], MD    Brief Narrative:   82 y.o. female with past medical history significant for recent C1/C2 fracture and central cord syndrome approximately 1 month ago following a fall at home status post laminectomy at [facility], PE, HFpEF, CKD Stage IIIa, DM Type 2, HTN, paroxsymal A.fib, Hypothyroidism. Of note, she was brought to the emergency department on 09/28/2022 because of bleeding from a PICC line on her right arm. She was brought from the nursing home to the emergency department because of hemoglobin of 6.8 on 10/02/2022.    Of note, details of recent fracture are as follows: She had a fall on 8/21 with associated neck pain and bilateral weakness. She was admitted at [facility] where CT showed C1-C1 fracture and a small subarachnoid hemorrhage. MRI obtained showed C1 and C2 fracture, ossification of posterior longitudinal ligament, cervical spondylosis, cervical spinal cord injury, central cord syndrome.  Initial plan by neuro surgery at [facility] was for posterior cervical decompression, instrumentation and fusion from C1-C6 to be done on 8/28, but it was felt she would be best served by medically stabilizing and correcting coagulopathy prior surgery.  However on 8/25 family requested she be transferred to [facility] for Neurosurgical intervention there.     She was admitted to the ICU for hypovolemic/hemorrhagic/septic shock and severe anemia.  She was treated with IV fluids, empiric IV antibiotics and IV vasopressors.  She was also transfused with 2 units of packed red blood cells.  Workup also showed that she had new C1 and C2 dislocation on top of recent C1 and C2 fractures with resultant severe spinal stenosis at C1-C2.  Dr. Katrinka Blazing, neurosurgeon, was consulted to assist with management.  He discussed the case with neurosurgeon at [facility] who recommended nonsurgical approach.  10/8: Patient  was previously on full comfort measures however this has been rescinded by the family.  We are attempting to medically optimized and place patient in skilled nursing facility.  As of this date we do not have an accepting facility.  Assessment & Plan:   Principal Problem:   Hemorrhagic shock (HCC) Active Problems:   Pulmonary hypertension (HCC)   CKD stage 3a, GFR 45-59 ml/min (HCC)   CAD (coronary artery disease)   Paroxysmal atrial fibrillation (HCC)   Chronic diastolic CHF (congestive heart failure) (HCC)   Closed dislocation of cervical vertebra   Pressure injury of skin   AKI (acute kidney injury) (HCC)   Acute blood loss anemia   Hyperkalemia   Hyponatremia   Pleural effusion on right  Hypovolemic/hemorrhagic/septic shock   Autonomic dysfunction in the setting of recent C1 and C2 spine fracture s/p laminectomy at [facility], now with worsening dislocation and significant spinal stenosis   Severe sepsis of unknown source   Acute blood loss anemia s/p transfusion with 2 units of PRBCs    Acute kidney injury on CKD stage IIIa   Acute metabolic encephalopathy   Type 2 diabetes mellitus   Right iliopsoas intramuscular hematoma   Chronic diastolic CHF   Pulmonary hypertension   Hyperkalemia   Hypoglycemia   Hyponatremia   Hypothyroidism   Small to moderate right pleural effusion without injury atelectasis   PLAN Family meeting held between 2 daughters in person, 1 son over the phone, palliative care NP on 10/4.  Family reaffirms their desire for aggressive medical intervention.  Tube feeds have been initiated.  Laboratory workup on  10/5 and 10/8 have been relatively reassuring.  Stable electrolytes with normal kidney function.  No evidence of acute blood loss.  Hemoglobin low but stable.  Downtrending white blood cell count.    Will continue PT and OT evaluations as tolerated.  TOC following for placement options.  May need to extend bed search if local  facilities are unable to offer a bed.    DVT prophylaxis: SQ lovenox Code Status: DNR Family Communication: Daughter [name] [phone] on 10/6, 10/8 Disposition Plan: Status is: Inpatient Remains inpatient appropriate because: Multiple acute issues as above   Level of care: Med-Surg  Consultants:  Palliative care  Procedures:  None  Antimicrobials: None   Subjective: Seen and examined.  Patient remains very fatigued.  Is mentating clearly.  Remains motivated to find rehab facility.  Objective: Vitals:   10/10/22 2028 10/11/22 0422 10/11/22 0430 10/11/22 0808  BP: 122/79 (!) 136/56  113/61  Pulse: 88 85  85  Resp: 17 17  18   Temp: (!) 97.5 F (36.4 C) 97.6 F (36.4 C)  98.5 F (36.9 C)  TempSrc: Oral     SpO2: 98% 100%  100%  Weight:   91.6 kg     Intake/Output Summary (Last 24 hours) at 10/11/2022 1136 Last data filed at 10/11/2022 0817 Gross per 24 hour  Intake 2456.13 ml  Output --  Net 2456.13 ml   Filed Weights   10/09/22 0500 10/10/22 0500 10/11/22 0430  Weight: 91.3 kg 91.5 kg 91.6 kg    Examination:  General exam: NAD.  Appears fatigued Respiratory system: Lungs clear.  Normal work of breathing.  Room air Cardiovascular system: S1-S2, RRR, no murmurs, no pedal edema Gastrointestinal system: Soft, NT/ND,+ ostomy+ PEG Central nervous system: Alert.  Oriented x 1.  No focal deficits Extremities: 1 X5 power bilateral upper and lower extremities Skin: No rashes, lesions or ulcers Psychiatry: Judgement and insight appear impaired. Mood & affect flattened.     Data Reviewed: I have personally reviewed following labs and imaging studies  CBC: Recent Labs  Lab 10/07/22 1917 10/09/22 1324  WBC 12.6* 12.7*  NEUTROABS 10.2* 10.1*  HGB 7.7* 7.8*  HCT 25.2* 24.1*  MCV 92.0 88.3  PLT 316 246   Basic Metabolic Panel: Recent Labs  Lab 10/05/22 1344 10/07/22 1917 10/08/22 1757 10/09/22 0523 10/09/22 1324 10/09/22 1726 10/10/22 0519  10/11/22 0831  NA 136 137  --   --  138  --   --  139  K 4.6 3.6  --   --  4.9  --   --  4.9  CL 101 105  --   --  105  --   --  109  CO2 22 25  --   --  25  --   --  27  GLUCOSE 98 104*  --   --  189*  --   --  220*  BUN 25* 27*  --   --  26*  --   --  26*  CREATININE 0.69 0.62  --   --  0.67  --   --  0.52  CALCIUM 8.2* 8.3*  --   --  8.6*  --   --  8.2*  MG  --   --  1.9 2.0  --  1.9 1.9 1.8  PHOS  --   --  2.9 2.7 2.8  --  2.5  --    GFR: Estimated Creatinine Clearance: 64.1 mL/min (by C-G formula based on SCr of  0.52 mg/dL). Liver Function Tests: No results for input(s): "AST", "ALT", "ALKPHOS", "BILITOT", "PROT", "ALBUMIN" in the last 168 hours. No results for input(s): "LIPASE", "AMYLASE" in the last 168 hours. No results for input(s): "AMMONIA" in the last 168 hours. Coagulation Profile: No results for input(s): "INR", "PROTIME" in the last 168 hours. Cardiac Enzymes: No results for input(s): "CKTOTAL", "CKMB", "CKMBINDEX", "TROPONINI" in the last 168 hours. BNP (last 3 results) No results for input(s): "PROBNP" in the last 8760 hours. HbA1C: No results for input(s): "HGBA1C" in the last 72 hours. CBG: Recent Labs  Lab 10/10/22 2028 10/11/22 0142 10/11/22 0423 10/11/22 0810 10/11/22 1130  GLUCAP 197* 208* 192* 208* 183*   Lipid Profile: No results for input(s): "CHOL", "HDL", "LDLCALC", "TRIG", "CHOLHDL", "LDLDIRECT" in the last 72 hours. Thyroid Function Tests: No results for input(s): "TSH", "T4TOTAL", "FREET4", "T3FREE", "THYROIDAB" in the last 72 hours. Anemia Panel: No results for input(s): "VITAMINB12", "FOLATE", "FERRITIN", "TIBC", "IRON", "RETICCTPCT" in the last 72 hours. Sepsis Labs: Recent Labs  Lab 10/05/22 1344  PROCALCITON 0.28    No results found for this or any previous visit (from the past 240 hour(s)).        Radiology Studies: No results found.      Scheduled Meds:  Chlorhexidine Gluconate Cloth  6 each Topical Daily    DULoxetine  30 mg Oral Daily   enoxaparin (LOVENOX) injection  40 mg Subcutaneous Q24H   feeding supplement (PROSource TF20)  60 mL Per Tube Daily   ferrous sulfate  325 mg Oral Q breakfast   insulin aspart  0-9 Units Subcutaneous Q4H   pantoprazole (PROTONIX) IV  40 mg Intravenous Q24H   sodium chloride flush  3 mL Intravenous Q12H   Continuous Infusions:  feeding supplement (OSMOLITE 1.5 CAL) 50 mL/hr at 10/11/22 0137     LOS: 11 days       Tresa Moore, MD [facility]   If 7PM-7AM, please contact night-coverage  10/11/2022, 11:36 AM

## 2022-10-11 NOTE — TOC Progression Note (Signed)
Transition of Care [facility]) - Progression Note    Patient Details  Name: Hannah [name] MRN: [id number] Date of Birth: [birth year]  [facility]) CM/SW Contact  Garret Reddish, RN Phone Number: 10/11/2022, 9:43 AM  Clinical Narrative:     Late entry for 10/10/22: I have spoken with APS SW Lakita Sittord.  She informs me that she continues to investigate the concerns about [facility].  No updates at this time.    I have spoken with patient's daughter Elon Jester and given her an update.  I have informed  [place], Admission Coordinator with  [facility].  She informs me that she currently is at maximum limit with patient's with peg tubes.  She currently is not able to take [name].  I have updated Elon Jester on the above information.  Elon Jester is open to extending bed search to [place] and surrounding area.    I will extend bed search to [facility] area and surrounding areas.    TOC will continue to follow for discharge planning.          Expected Discharge Plan and Services                                               Social Determinants of Health (SDOH) Interventions SDOH Screenings   Food Insecurity: No Food Insecurity (09/15/2022)   Received from [facility]  Housing: Low Risk  (05/20/2022)  Transportation Needs: No Transportation Needs (09/15/2022)   Received from [facility]  Utilities: Not At Risk (09/15/2022)   Received from [facility]  Alcohol Screen: Low Risk  (04/29/2022)  Depression (PHQ2-9): Low Risk  (09/13/2021)  Financial Resource Strain: Low Risk  (09/15/2022)   Received from [facility]  Physical Activity: Insufficiently Active (06/24/2021)  Social Connections: Moderately Integrated (06/24/2021)  Stress: No Stress Concern Present (06/24/2021)  Tobacco Use: Medium Risk (09/28/2022)    Readmission Risk Interventions    01/04/2021    1:14 PM  Readmission  Risk Prevention Plan  Transportation Screening Complete  PCP or Specialist Appt within 5-7 Days --  Home Care Screening Complete  Medication Review (RN CM) Complete

## 2022-10-11 NOTE — Plan of Care (Signed)
Problem: Education: Goal: Understanding of CV disease, CV risk reduction, and recovery process will improve Outcome: Progressing   Problem: Activity: Goal: Ability to return to baseline activity level will improve Outcome: Progressing   Problem: Cardiovascular: Goal: Ability to achieve and maintain adequate cardiovascular perfusion will improve Outcome: Progressing   Problem: Health Behavior/Discharge Planning: Goal: Ability to safely manage health-related needs after discharge will improve Outcome: Progressing   Problem: Education: Goal: Knowledge of disease or condition will improve Outcome: Progressing Goal: Knowledge of secondary prevention will improve (MUST DOCUMENT ALL) Outcome: Progressing Goal: Knowledge of patient specific risk factors will improve Loraine Leriche N/A or DELETE if not current risk factor) Outcome: Progressing   Problem: Ischemic Stroke/TIA Tissue Perfusion: Goal: Complications of ischemic stroke/TIA will be minimized Outcome: Progressing   Problem: Coping: Goal: Will verbalize positive feelings about self Outcome: Progressing Goal: Will identify appropriate support needs Outcome: Progressing   Problem: Health Behavior/Discharge Planning: Goal: Ability to manage health-related needs will improve Outcome: Progressing Goal: Goals will be collaboratively established with patient/family Outcome: Progressing   Problem: Self-Care: Goal: Ability to participate in self-care as condition permits will improve Outcome: Progressing Goal: Verbalization of feelings and concerns over difficulty with self-care will improve Outcome: Progressing Goal: Ability to communicate needs accurately will improve Outcome: Progressing   Problem: Nutrition: Goal: Risk of aspiration will decrease Outcome: Progressing   Problem: Education: Goal: Knowledge of General Education information will improve Description: Including pain rating scale, medication(s)/side effects and  non-pharmacologic comfort measures Outcome: Progressing   Problem: Health Behavior/Discharge Planning: Goal: Ability to manage health-related needs will improve Outcome: Progressing   Problem: Clinical Measurements: Goal: Ability to maintain clinical measurements within normal limits will improve Outcome: Progressing Goal: Will remain free from infection Outcome: Progressing Goal: Diagnostic test results will improve Outcome: Progressing Goal: Respiratory complications will improve Outcome: Progressing Goal: Cardiovascular complication will be avoided Outcome: Progressing   Problem: Activity: Goal: Risk for activity intolerance will decrease Outcome: Progressing   Problem: Nutrition: Goal: Adequate nutrition will be maintained Outcome: Progressing   Problem: Coping: Goal: Level of anxiety will decrease Outcome: Progressing   Problem: Elimination: Goal: Will not experience complications related to bowel motility Outcome: Progressing Goal: Will not experience complications related to urinary retention Outcome: Progressing   Problem: Pain Managment: Goal: General experience of comfort will improve Outcome: Progressing   Problem: Safety: Goal: Ability to remain free from injury will improve Outcome: Progressing   Problem: Skin Integrity: Goal: Risk for impaired skin integrity will decrease Outcome: Progressing   Problem: Education: Goal: Ability to describe self-care measures that may prevent or decrease complications (Diabetes Survival Skills Education) will improve Outcome: Progressing Goal: Individualized Educational Video(s) Outcome: Progressing   Problem: Coping: Goal: Ability to adjust to condition or change in health will improve Outcome: Progressing   Problem: Fluid Volume: Goal: Ability to maintain a balanced intake and output will improve Outcome: Progressing   Problem: Health Behavior/Discharge Planning: Goal: Ability to identify and utilize  available resources and services will improve Outcome: Progressing Goal: Ability to manage health-related needs will improve Outcome: Progressing   Problem: Metabolic: Goal: Ability to maintain appropriate glucose levels will improve Outcome: Progressing   Problem: Nutritional: Goal: Maintenance of adequate nutrition will improve Outcome: Progressing Goal: Progress toward achieving an optimal weight will improve Outcome: Progressing   Problem: Skin Integrity: Goal: Risk for impaired skin integrity will decrease Outcome: Progressing   Problem: Tissue Perfusion: Goal: Adequacy of tissue perfusion will improve Outcome: Progressing   Problem: Education: Goal:  Knowledge of the prescribed therapeutic regimen will improve Outcome: Progressing   Problem: Coping: Goal: Ability to identify and develop effective coping behavior will improve Outcome: Progressing   Problem: Nutrition: Goal: Dietary intake will improve Outcome: Not Progressing

## 2022-10-11 NOTE — Progress Notes (Addendum)
Nutrition Follow-up  DOCUMENTATION CODES:   Obesity unspecified  INTERVENTION:   Change to Glucerna 1.5@55ml /hr continuous   Free water flushes q4 hours   Regimen provides 1980kcal/day, 109g/day protein and 1641ml/day of free water.   Juven Fruit Punch BID via tube, each serving provides 95kcal and 2.5g of protein (amino acids glutamine and arginine)  NUTRITION DIAGNOSIS:   Inadequate oral intake related to dysphagia, poor appetite as evidenced by meal completion < 25%, other (comment) (pt requires G-tube and nutrition support). -ongoing   GOAL:   Patient will meet greater than or equal to 90% of their needs -met with tube feeds   MONITOR:   PO intake, Labs, Weight trends, TF tolerance, Skin, I & O's  ASSESSMENT:   82 y.o. female with a history of PE, HFrEF, CKD3, DM2 with neuropathy, HLD, HTN, pulmonary hypertension, CAD, NSTEMI, SAH, hypothyroid, large bowel stricture s/p colectomy (2016), pAF and recent admission to [facility] for mechanical fall on 8/21, found to have C1 and C2 fractures (s/p C1-2 sublaminar wiring, C3 laminectomy and C4-6 laminoplasty complicated by dysphagia and poor oral intake requiring IR G-tube 9/18) and who is admitted with hemorrhagic shock secondary to right iliopsoas intramuscular hematoma.  Visited pt's room today; pt reports that she is feeling ok. Pt is tolerating tube feeds well at goal rate. Pt noted with hyperglycemia today. Spoke with RN from [employer], pt previously receiving Glucerna 1.5@60ml /hr, nocturnal feeds, from 2100-0700 overnight (provides 900kcal/day and 50g/day protein) at their facility. Pt also eats a dysphagia 1/nectar thick diet and generally eats 50% of meals. Pt seen by SLP here and was upgraded to thin liquids. Pt has been receiving Osmolite in hospital at a continuous rate; will change over to Glucerna to help with hyperglycemia. Pt with poor appetite and oral intake in hospital so will meet 100% of pt's estimated  needs via nutrition support at this time. Will add Juven to support wound healing. Refeed labs stable. Per chart, pt appears weight stable at baseline.    Medications reviewed and include: lovenox, ferrous sulfate, insulin, protonix  Labs reviewed: K 4.9 wnl, BUN 26(H), Mg 1.8 wnl P 2.5 wnl- 10/7 Wbc- 12.7(H), Hgb 7.8(L), Hct 24.1(L) Cbgs- 183, 208, 192, 208 x 24 hrs  AIC 6.0(H)- 8/22  NUTRITION - FOCUSED PHYSICAL EXAM:  Flowsheet Row Most Recent Value  Orbital Region No depletion  Upper Arm Region No depletion  Thoracic and Lumbar Region No depletion  Buccal Region No depletion  Temple Region No depletion  Clavicle Bone Region Mild depletion  Clavicle and Acromion Bone Region Mild depletion  Scapular Bone Region No depletion  Dorsal Hand No depletion  Patellar Region No depletion  Anterior Thigh Region No depletion  Posterior Calf Region No depletion  Edema (RD Assessment) Mild  Hair Reviewed  Eyes Reviewed  Mouth Reviewed  Skin Reviewed  Nails Reviewed   Diet Order:   Diet Order             DIET - DYS 1 Room service appropriate? Yes with Assist; Fluid consistency: Thin  Diet effective now                  EDUCATION NEEDS:   Not appropriate for education at this time  Skin:  Skin Assessment: Skin Integrity Issues: Skin Integrity Issues:: Stage II Stage II: Buttocks  Last BM:  10/8- via ostomy  Height:   Ht Readings from Last 1 Encounters:  09/28/22 5\' 7"  (1.702 m)    Weight:  Wt Readings from Last 1 Encounters:  10/11/22 91.6 kg    Ideal Body Weight:  61.36 kg  BMI:  Body mass index is 31.63 kg/m.  Estimated Nutritional Needs:   Kcal:  1800-2100kcal/day  Protein:  90-105g/day  Fluid:  1.7-1.9L/day  Betsey Holiday MS, RD, LDN Please refer to [facility] for RD and/or RD on-call/weekend/after hours pager

## 2022-10-11 NOTE — Plan of Care (Signed)
Problem: Education: Goal: Understanding of CV disease, CV risk reduction, and recovery process will improve Outcome: Progressing   Problem: Activity: Goal: Ability to return to baseline activity level will improve Outcome: Progressing   Problem: Cardiovascular: Goal: Ability to achieve and maintain adequate cardiovascular perfusion will improve Outcome: Progressing   Problem: Health Behavior/Discharge Planning: Goal: Ability to safely manage health-related needs after discharge will improve Outcome: Progressing   Problem: Education: Goal: Knowledge of disease or condition will improve Outcome: Progressing Goal: Knowledge of secondary prevention will improve (MUST DOCUMENT ALL) Outcome: Progressing Goal: Knowledge of patient specific risk factors will improve Loraine Leriche N/A or DELETE if not current risk factor) Outcome: Progressing   Problem: Ischemic Stroke/TIA Tissue Perfusion: Goal: Complications of ischemic stroke/TIA will be minimized Outcome: Progressing   Problem: Coping: Goal: Will verbalize positive feelings about self Outcome: Progressing Goal: Will identify appropriate support needs Outcome: Progressing   Problem: Health Behavior/Discharge Planning: Goal: Ability to manage health-related needs will improve Outcome: Progressing Goal: Goals will be collaboratively established with patient/family Outcome: Progressing   Problem: Self-Care: Goal: Ability to participate in self-care as condition permits will improve Outcome: Progressing Goal: Verbalization of feelings and concerns over difficulty with self-care will improve Outcome: Progressing Goal: Ability to communicate needs accurately will improve Outcome: Progressing   Problem: Nutrition: Goal: Risk of aspiration will decrease Outcome: Progressing Goal: Dietary intake will improve Outcome: Progressing   Problem: Education: Goal: Knowledge of General Education information will improve Description:  Including pain rating scale, medication(s)/side effects and non-pharmacologic comfort measures Outcome: Progressing   Problem: Health Behavior/Discharge Planning: Goal: Ability to manage health-related needs will improve Outcome: Progressing   Problem: Clinical Measurements: Goal: Ability to maintain clinical measurements within normal limits will improve Outcome: Progressing Goal: Will remain free from infection Outcome: Progressing Goal: Diagnostic test results will improve Outcome: Progressing Goal: Respiratory complications will improve Outcome: Progressing Goal: Cardiovascular complication will be avoided Outcome: Progressing   Problem: Activity: Goal: Risk for activity intolerance will decrease Outcome: Progressing   Problem: Nutrition: Goal: Adequate nutrition will be maintained Outcome: Progressing   Problem: Coping: Goal: Level of anxiety will decrease Outcome: Progressing   Problem: Elimination: Goal: Will not experience complications related to bowel motility Outcome: Progressing Goal: Will not experience complications related to urinary retention Outcome: Progressing   Problem: Pain Managment: Goal: General experience of comfort will improve Outcome: Progressing   Problem: Safety: Goal: Ability to remain free from injury will improve Outcome: Progressing   Problem: Skin Integrity: Goal: Risk for impaired skin integrity will decrease Outcome: Progressing   Problem: Education: Goal: Ability to describe self-care measures that may prevent or decrease complications (Diabetes Survival Skills Education) will improve Outcome: Progressing Goal: Individualized Educational Video(s) Outcome: Progressing   Problem: Coping: Goal: Ability to adjust to condition or change in health will improve Outcome: Progressing   Problem: Fluid Volume: Goal: Ability to maintain a balanced intake and output will improve Outcome: Progressing   Problem: Health  Behavior/Discharge Planning: Goal: Ability to identify and utilize available resources and services will improve Outcome: Progressing Goal: Ability to manage health-related needs will improve Outcome: Progressing   Problem: Metabolic: Goal: Ability to maintain appropriate glucose levels will improve Outcome: Progressing   Problem: Nutritional: Goal: Maintenance of adequate nutrition will improve Outcome: Progressing Goal: Progress toward achieving an optimal weight will improve Outcome: Progressing   Problem: Skin Integrity: Goal: Risk for impaired skin integrity will decrease Outcome: Progressing   Problem: Tissue Perfusion: Goal: Adequacy of tissue perfusion will improve  Outcome: Progressing   Problem: Education: Goal: Knowledge of the prescribed therapeutic regimen will improve Outcome: Progressing   Problem: Coping: Goal: Ability to identify and develop effective coping behavior will improve Outcome: Progressing

## 2022-10-12 ENCOUNTER — Inpatient Hospital Stay: Payer: Medicare Other

## 2022-10-12 DIAGNOSIS — R578 Other shock: Secondary | ICD-10-CM | POA: Diagnosis not present

## 2022-10-12 LAB — CBC WITH DIFFERENTIAL/PLATELET
Abs Immature Granulocytes: 0.16 10*3/uL — ABNORMAL HIGH (ref 0.00–0.07)
Basophils Absolute: 0 10*3/uL (ref 0.0–0.1)
Basophils Relative: 0 %
Eosinophils Absolute: 0.3 10*3/uL (ref 0.0–0.5)
Eosinophils Relative: 2 %
HCT: 26.9 % — ABNORMAL LOW (ref 36.0–46.0)
Hemoglobin: 8.1 g/dL — ABNORMAL LOW (ref 12.0–15.0)
Immature Granulocytes: 1 %
Lymphocytes Relative: 9 %
Lymphs Abs: 1.3 10*3/uL (ref 0.7–4.0)
MCH: 28.1 pg (ref 26.0–34.0)
MCHC: 30.1 g/dL (ref 30.0–36.0)
MCV: 93.4 fL (ref 80.0–100.0)
Monocytes Absolute: 0.7 10*3/uL (ref 0.1–1.0)
Monocytes Relative: 4 %
Neutro Abs: 12.6 10*3/uL — ABNORMAL HIGH (ref 1.7–7.7)
Neutrophils Relative %: 84 %
Platelets: 309 10*3/uL (ref 150–400)
RBC: 2.88 MIL/uL — ABNORMAL LOW (ref 3.87–5.11)
RDW: 19.9 % — ABNORMAL HIGH (ref 11.5–15.5)
WBC: 15 10*3/uL — ABNORMAL HIGH (ref 4.0–10.5)
nRBC: 0.6 % — ABNORMAL HIGH (ref 0.0–0.2)

## 2022-10-12 LAB — BASIC METABOLIC PANEL
Anion gap: 5 (ref 5–15)
BUN: 30 mg/dL — ABNORMAL HIGH (ref 8–23)
CO2: 28 mmol/L (ref 22–32)
Calcium: 8.4 mg/dL — ABNORMAL LOW (ref 8.9–10.3)
Chloride: 105 mmol/L (ref 98–111)
Creatinine, Ser: 0.57 mg/dL (ref 0.44–1.00)
GFR, Estimated: >60 mL/min (ref >60)
Glucose, Bld: 154 mg/dL — ABNORMAL HIGH (ref 70–99)
Potassium: 4.5 mmol/L (ref 3.5–5.1)
Sodium: 138 mmol/L (ref 135–145)

## 2022-10-12 LAB — VITAMIN B12: Vitamin B-12: 562 pg/mL (ref 180–914)

## 2022-10-12 LAB — IRON AND TIBC
Iron: 52 ug/dL (ref 28–170)
Saturation Ratios: 25 % (ref 10.4–31.8)
TIBC: 207 ug/dL — ABNORMAL LOW (ref 250–450)
UIBC: 155 ug/dL

## 2022-10-12 LAB — GLUCOSE, CAPILLARY
Glucose-Capillary: 103 mg/dL — ABNORMAL HIGH (ref 70–99)
Glucose-Capillary: 113 mg/dL — ABNORMAL HIGH (ref 70–99)
Glucose-Capillary: 120 mg/dL — ABNORMAL HIGH (ref 70–99)
Glucose-Capillary: 133 mg/dL — ABNORMAL HIGH (ref 70–99)
Glucose-Capillary: 133 mg/dL — ABNORMAL HIGH (ref 70–99)
Glucose-Capillary: 141 mg/dL — ABNORMAL HIGH (ref 70–99)

## 2022-10-12 LAB — MAGNESIUM: Magnesium: 1.8 mg/dL (ref 1.7–2.4)

## 2022-10-12 LAB — FOLATE: Folate: 3.4 ng/mL — ABNORMAL LOW (ref >5.9)

## 2022-10-12 LAB — VITAMIN D 25 HYDROXY (VIT D DEFICIENCY, FRACTURES): Vit D, 25-Hydroxy: 22.7 ng/mL — ABNORMAL LOW (ref 30–100)

## 2022-10-12 LAB — PHOSPHORUS: Phosphorus: 1.5 mg/dL — ABNORMAL LOW (ref 2.5–4.6)

## 2022-10-12 MED ORDER — FUROSEMIDE 10 MG/ML IJ SOLN
40.0000 mg | Freq: Once | INTRAMUSCULAR | Status: AC
  Administered 2022-10-12: 40 mg via INTRAVENOUS
  Filled 2022-10-12: qty 4

## 2022-10-12 MED ORDER — SODIUM PHOSPHATES 45 MMOLE/15ML IV SOLN
30.0000 mmol | Freq: Once | INTRAVENOUS | Status: AC
  Administered 2022-10-12: 30 mmol via INTRAVENOUS
  Filled 2022-10-12: qty 10

## 2022-10-12 MED ORDER — FOLIC ACID 1 MG PO TABS
1.0000 mg | ORAL_TABLET | Freq: Every day | ORAL | Status: DC
  Administered 2022-10-12 – 2022-10-15 (×4): 1 mg via ORAL
  Filled 2022-10-12 (×4): qty 1

## 2022-10-12 MED ORDER — MAGNESIUM SULFATE 2 GM/50ML IV SOLN
2.0000 g | Freq: Once | INTRAVENOUS | Status: AC
  Administered 2022-10-12: 2 g via INTRAVENOUS
  Filled 2022-10-12: qty 50

## 2022-10-12 MED ORDER — MORPHINE SULFATE (PF) 2 MG/ML IV SOLN
2.0000 mg | INTRAVENOUS | Status: DC | PRN
  Administered 2022-10-12 – 2022-10-14 (×5): 2 mg via INTRAVENOUS
  Filled 2022-10-12 (×5): qty 1

## 2022-10-12 MED ORDER — FUROSEMIDE 10 MG/ML IJ SOLN
20.0000 mg | Freq: Two times a day (BID) | INTRAMUSCULAR | Status: DC
  Filled 2022-10-12: qty 2

## 2022-10-12 NOTE — Progress Notes (Signed)
Upon arrival daughter's were present inquiring about advanced directive. Chaplain offered education. Prayer was offered. Kayzlee reports she continues to have pain in her neck and desires help.     10/12/22 1800  Spiritual Encounters  Type of Visit Initial  Care provided to: Pt and family  Referral source Family  Reason for visit Advance directives  OnCall Visit Yes

## 2022-10-12 NOTE — Progress Notes (Signed)
[facility] Progress Note  Patient: Hannah [name]    UEA:[id number]  DOA: 09/14/2022     Date of Service: the patient was seen and examined on 10/12/2022  Chief Complaint  Patient presents with   Abnormal Lab    Pt. To ED via EMS from [facility] for abnormal lab, Hgb of 6.8. unknown if any bleeding. Pt. Is baseline nonverbal, diabetic, CHF. Medic states they think pt. Had routine blood draw.   Brief hospital course: 82 y.o. female with past medical history significant for recent C1/C2 fracture and central cord syndrome approximately 1 month ago following a fall at home status post laminectomy at [facility], PE, HFpEF, CKD Stage IIIa, DM Type 2, HTN, paroxsymal A.fib, Hypothyroidism. Of note, she was brought to the emergency department on 09/28/2022 because of bleeding from a PICC line on her right arm. She was brought from the nursing home to the emergency department because of hemoglobin of 6.8 on 09/20/2022.    Of note, details of recent fracture are as follows: She had a fall on 8/21 with associated neck pain and bilateral weakness. She was admitted at [facility] where CT showed C1-C1 fracture and a small subarachnoid hemorrhage. MRI obtained showed C1 and C2 fracture, ossification of posterior longitudinal ligament, cervical spondylosis, cervical spinal cord injury, central cord syndrome.  Initial plan by neuro surgery at [facility] was for posterior cervical decompression, instrumentation and fusion from C1-C6 to be done on 8/28, but it was felt she would be best served by medically stabilizing and correcting coagulopathy prior surgery.  However on 8/25 family requested she be transferred to [facility] for Neurosurgical intervention there.     She was admitted to the ICU for hypovolemic/hemorrhagic/septic shock and severe anemia.  She was treated with IV fluids, empiric IV antibiotics and IV vasopressors.  She was also transfused with 2 units of packed red blood cells.  Workup also showed that  she had new C1 and C2 dislocation on top of recent C1 and C2 fractures with resultant severe spinal stenosis at C1-C2.  Dr. Katrinka Blazing, neurosurgeon, was consulted to assist with management.  He discussed the case with neurosurgeon at [facility] who recommended nonsurgical approach.   10/8: Patient was previously on full comfort measures however this has been rescinded by the family.  We are attempting to medically optimized and place patient in skilled nursing facility.  As of this date we do not have an accepting facility.   Assessment and Plan: Hypovolemic/hemorrhagic/septic shock.  Resolved   Autonomic dysfunction in the setting of recent C1 and C2 spine fracture s/p laminectomy at [facility], now with worsening dislocation and significant spinal stenosis Continue cervical collar. 10/9 patient was having discomfort, size needs to be changed.  Discussed with neurosurgery to reeval for cervical collar size.   Right arm swelling, follow venous duplex to rule out DVT Keep right arm elevated  Severe sepsis of unknown source   Acute blood loss anemia s/p transfusion with 2 units of PRBCs    History of pulmonary embolism, patient is not on DOAC currently due to risk of bleeding Continue Lovenox for prophylaxis  Acute kidney injury on CKD stage IIIa   Acute metabolic encephalopathy, AAOx 1   Type 2 diabetes mellitus Hypoglycemia, resolved developed Continue NovoLog sliding scale, monitor CBG  Right iliopsoas intramuscular hematoma   Chronic diastolic CHF Pulmonary hypertension 10/9 Lasix 40 mg x 1 dose followed by Lasix 40 mg twice daily Monitor renal functions and urine output Started  diuresis due to significant edema   Hyperkalemia, resolved Hyponatremia, resolved  Hypothyroidism, Synthroid was discontinued, unknown reason.  Recommend to follow with PCP.  Hypophosphatemia, Phos repleted. Monitor electrolytes and replete as needed.  Folic acid deficiency,  started folic acid 1 mg p.o. daily  Small to moderate right pleural effusion without injury atelectasis   PLAN Family meeting held between 2 daughters in person, 1 son over the phone, palliative care NP on 10/4.  Family reaffirms their desire for aggressive medical intervention.  Tube feeds have been initiated.  Laboratory workup on 10/5 and 10/8 have been relatively reassuring.  Stable electrolytes with normal kidney function.  No evidence of acute blood loss.  Hemoglobin low but stable.  Downtrending white blood cell count.     Will continue PT and OT evaluations as tolerated.  TOC following for placement options.  May need to extend bed search if local facilities are unable to offer a bed.     Body mass index is 31.63 kg/m.  Nutrition Problem: Inadequate oral intake Etiology: dysphagia, poor appetite Interventions: Interventions: Refer to RD note for recommendations, Magic cup, Tube feeding  Pressure Injury 10/01/22 Buttocks Stage 2 -  Partial thickness loss of dermis presenting as a shallow open injury with a red, pink wound bed without slough. (Active)  10/01/22 1559  Location: Buttocks  Location Orientation:   Staging: Stage 2 -  Partial thickness loss of dermis presenting as a shallow open injury with a red, pink wound bed without slough.  Wound Description (Comments):   Present on Admission: Yes  Dressing Type Foam - Lift dressing to assess site every shift 10/12/22 0900     Pressure Injury 10/01/22 Buttocks Left Stage 2 -  Partial thickness loss of dermis presenting as a shallow open injury with a red, pink wound bed without slough. (Active)  10/01/22 1602  Location: Buttocks  Location Orientation: Left  Staging: Stage 2 -  Partial thickness loss of dermis presenting as a shallow open injury with a red, pink wound bed without slough.  Wound Description (Comments):   Present on Admission: Yes  Dressing Type Foam - Lift dressing to assess site every shift 10/12/22 0900      Diet: Dysphagia 1 diet DVT Prophylaxis: Subcutaneous Lovenox   Advance goals of care discussion: DNR  Family Communication: family was not present at bedside, at the time of interview.  The pt provided permission to discuss medical plan with the family. Opportunity was given to ask question and all questions were answered satisfactorily.   Disposition:  Pt is from SNF, admitted with hemorrhagic shock, still has elevated WBC count, right upper extremity edema, which precludes a safe discharge. Discharge to SNF, when bed will be available.  Subjective: No significant events overnight, patient was resting comfortably, denied any pain in the neck.  Physical Exam: General: NAD, lying comfortably Appear in no distress, affect appropriate Eyes: PERRLA ENT: Oral Mucosa Clear, moist, cervical collar intact Neck: no JVD,  Cardiovascular: S1 and S2 Present, no Murmur,  Respiratory: good respiratory effort, Bilateral Air entry equal and Decreased, no Crackles, no wheezes Abdomen: Bowel Sound present, Soft and no tenderness, PEG tube intact Skin: no rashes Extremities: RUE 4+ edema, LUE 2+ edema and mild Pedal edema, no calf tenderness Neurologic: Quadriplegic, power 1 x 5 bilateral upper and lower remedies, patient is only able to wiggle toes  Vitals:   10/11/22 1937 10/12/22 0500 10/12/22 0526 10/12/22 0814  BP: 113/62  (!) 101/43 (!) 107/49  Pulse:  96  92 97  Resp: 18  17 19   Temp: (!) 97.4 F (36.3 C)  97.6 F (36.4 C) 97.7 F (36.5 C)  TempSrc:   Oral Oral  SpO2: 100%  100% 99%  Weight:  91.6 kg      Intake/Output Summary (Last 24 hours) at 10/12/2022 1448 Last data filed at 10/11/2022 1615 Gross per 24 hour  Intake 724 ml  Output 25 ml  Net 699 ml   Filed Weights   10/10/22 0500 10/11/22 0430 10/12/22 0500  Weight: 91.5 kg 91.6 kg 91.6 kg    Data Reviewed: I have personally reviewed and interpreted daily labs, tele strips, imagings as discussed above. I reviewed all  nursing notes, pharmacy notes, vitals, pertinent old records I have discussed plan of care as described above with RN and patient/family.  CBC: Recent Labs  Lab 10/07/22 1917 10/09/22 1324 10/12/22 0356  WBC 12.6* 12.7* 15.0*  NEUTROABS 10.2* 10.1* 12.6*  HGB 7.7* 7.8* 8.1*  HCT 25.2* 24.1* 26.9*  MCV 92.0 88.3 93.4  PLT 316 246 309   Basic Metabolic Panel: Recent Labs  Lab 10/07/22 1917 10/08/22 1757 10/08/22 1757 10/09/22 0523 10/09/22 1324 10/09/22 1726 10/10/22 0519 10/11/22 0831 10/12/22 0356 10/12/22 0358  NA 137  --   --   --  138  --   --  139 138  --   K 3.6  --   --   --  4.9  --   --  4.9 4.5  --   CL 105  --   --   --  105  --   --  109 105  --   CO2 25  --   --   --  25  --   --  27 28  --   GLUCOSE 104*  --   --   --  189*  --   --  220* 154*  --   BUN 27*  --   --   --  26*  --   --  26* 30*  --   CREATININE 0.62  --   --   --  0.67  --   --  0.52 0.57  --   CALCIUM 8.3*  --   --   --  8.6*  --   --  8.2* 8.4*  --   MG  --  1.9   < > 2.0  --  1.9 1.9 1.8  --  1.8  PHOS  --  2.9  --  2.7 2.8  --  2.5  --   --  1.5*   < > = values in this interval not displayed.    Studies: No results found.  Scheduled Meds:  Chlorhexidine Gluconate Cloth  6 each Topical Daily   DULoxetine  30 mg Oral Daily   enoxaparin (LOVENOX) injection  40 mg Subcutaneous Q24H   ferrous sulfate  325 mg Oral Q breakfast   folic acid  1 mg Oral Daily   free water  100 mL Per Tube Q4H   insulin aspart  0-9 Units Subcutaneous Q4H   nutrition supplement (JUVEN)  1 packet Per Tube BID BM   pantoprazole (PROTONIX) IV  40 mg Intravenous Q24H   sodium chloride flush  3 mL Intravenous Q12H   Continuous Infusions:  feeding supplement (GLUCERNA 1.5 CAL) 1,000 mL (10/11/22 1617)   magnesium sulfate bolus IVPB 2 g (10/12/22 1433)   sodium phosphate 30 mmol in dextrose 5 %  250 mL infusion     PRN Meds: acetaminophen **OR** acetaminophen, antiseptic oral rinse, fentaNYL (SUBLIMAZE)  injection, ondansetron **OR** ondansetron (ZOFRAN) IV, polyethylene glycol, polyvinyl alcohol  Time spent: 35 minutes  Author: Gillis Santa. MD [facility] 10/12/2022 2:48 PM  To reach On-call, see care teams to locate the attending and reach out to them via [email or web address]. If 7PM-7AM, please contact night-coverage If you still have difficulty reaching the attending provider, please page the [facility] (Director on Call) for [facility] on amion for assistance.

## 2022-10-12 NOTE — Progress Notes (Signed)
Physical Therapy Treatment Patient Details Name: Hannah [name] MRN: [id number] DOB: 11-08-1940 Today's Date: 10/12/2022   History of Present Illness Pt. is baseline nonverbal, diabetic, CHF, a-fib. Complicated patient with C1-C2 fracture following a fall and prolonged hospitalization at [facility], recently discharged to [facility] now admitted with multi-factorial shock and altered mental status. CT on admission reveals known recent C1 and C2 fractures with increased fracture  displacement and angulation including new complete posterior  dislocation of the lateral masses of C1 relative to C2. Resultant  severe spinal stenosis at C1-2. Per secure chat with Dr. Katrinka [name] Neurosurgery states no further tx planned, ordering soft collar.    PT Comments  Pt seen for PT tx with pt agreeable. Pt noted to be lying in bed with R lateral cervical flexion & R rotation (chin to shoulder). Nurse in room & PT attempted to reposition soft collar but not moving head/neck 2/2 injuries & unable to properly position collar as it continues to come over pt's chin. Reached out to neurosurgery via secure chat re: recommendations as far as how much can pt's neck be manipulated to midline for proper positioning of soft collar or other suggestions, awaiting response.  Pt also noted to have significant RUE edema that nurse was told occurred overnight. PT notified MD via secure chat, RUE not moved during session. PT assists pt with BLE exercises/ROM. Pt with minimal ankle movement as well as even less activation when performing heel slides. Discussed role of PT with pt following injury like this with pt hopeful she can "walk & say hello" to people again. PT provided therapeutic listening & educated pt on importance of short term vs long term goals with walking being LTG & suggestion something like sitting EOB as STG with pt agreeable.    If plan is discharge home, recommend the following: Two people to help with walking and/or transfers;Two  people to help with bathing/dressing/bathroom   Can travel by private vehicle     No  Equipment Recommendations  None recommended by PT    Recommendations for Other Services       Precautions / Restrictions Precautions Precautions: Cervical;Fall Precaution Comments: C1-2 fracture and disclocation. Conservative management Required Braces or Orthoses: Cervical Brace Cervical Brace: Soft collar Restrictions Weight Bearing Restrictions: No     Mobility  Bed Mobility               General bed mobility comments: PT shifted pt's shoulder/s trunk to L, hips to R for more midline positioning.    Transfers                        Ambulation/Gait                   Stairs             Wheelchair Mobility     Tilt Bed    Modified Rankin (Stroke Patients Only)       Balance                                            Cognition Arousal: Alert Behavior During Therapy: Flat affect Overall Cognitive Status: No family/caregiver present to determine baseline cognitive functioning  General Comments: Very sweet lady. Pt oriented to hospital (reports [facility] in [place] with PT orienting her to [facility] in [place]), pt oriented to 2024 but reports it's September.        Exercises General Exercises - Lower Extremity Ankle Circles/Pumps: PROM, AAROM, Both, 10 reps, Supine Heel Slides: PROM, AAROM, Supine, Strengthening, Both, 10 reps Hip ABduction/ADduction: PROM, Supine, Both, 10 reps    General Comments        Pertinent Vitals/Pain Pain Assessment Pain Assessment: Faces Faces Pain Scale: Hurts little more Pain Location: grimacing when PT/nurse attempting to adjust soft collar, repositioning pt's trunk in bed Pain Descriptors / Indicators: Grimacing, Guarding Pain Intervention(s): Limited activity within patient's tolerance, Monitored during session    Home Living                           Prior Function            PT Goals (current goals can now be found in the care plan section) Acute Rehab PT Goals Patient Stated Goal: none stated PT Goal Formulation: With patient/family Time For Goal Achievement: 10/19/22 Potential to Achieve Goals: Poor Progress towards PT goals: PT to reassess next treatment    Frequency    Min 1X/week      PT Plan      Co-evaluation              AM-PAC PT "6 Clicks" Mobility   Outcome Measure  Help needed turning from your back to your side while in a flat bed without using bedrails?: Total Help needed moving from lying on your back to sitting on the side of a flat bed without using bedrails?: Total Help needed moving to and from a bed to a chair (including a wheelchair)?: Total Help needed standing up from a chair using your arms (e.g., wheelchair or bedside chair)?: Total Help needed to walk in hospital room?: Total Help needed climbing 3-5 steps with a railing? : Total 6 Click Score: 6    End of Session Equipment Utilized During Treatment: Cervical collar Activity Tolerance: Patient tolerated treatment well Patient left: in bed;with call bell/phone within reach;with bed alarm set Nurse Communication:  (need for assistance with eating breakfast, RUE edema, issues with cervical collar positioning) PT Visit Diagnosis: Other abnormalities of gait and mobility (R26.89);Muscle weakness (generalized) (M62.81);Adult, failure to thrive (R62.7);Pain     Time: 1610-9604 PT Time Calculation (min) (ACUTE ONLY): 16 min  Charges:    $Therapeutic Activity: 8-22 mins PT General Charges $$ ACUTE PT VISIT: 1 Visit                     Hannah [name], PT, DPT 10/12/22, 9:56 AM   Hannah [name] 10/12/2022, 9:53 AM

## 2022-10-13 DIAGNOSIS — R578 Other shock: Secondary | ICD-10-CM | POA: Diagnosis not present

## 2022-10-13 LAB — GLUCOSE, CAPILLARY
Glucose-Capillary: 119 mg/dL — ABNORMAL HIGH (ref 70–99)
Glucose-Capillary: 134 mg/dL — ABNORMAL HIGH (ref 70–99)
Glucose-Capillary: 138 mg/dL — ABNORMAL HIGH (ref 70–99)
Glucose-Capillary: 140 mg/dL — ABNORMAL HIGH (ref 70–99)
Glucose-Capillary: 167 mg/dL — ABNORMAL HIGH (ref 70–99)
Glucose-Capillary: 174 mg/dL — ABNORMAL HIGH (ref 70–99)

## 2022-10-13 LAB — BASIC METABOLIC PANEL
Anion gap: 9 (ref 5–15)
BUN: 49 mg/dL — ABNORMAL HIGH (ref 8–23)
CO2: 27 mmol/L (ref 22–32)
Calcium: 8.2 mg/dL — ABNORMAL LOW (ref 8.9–10.3)
Chloride: 99 mmol/L (ref 98–111)
Creatinine, Ser: 0.94 mg/dL (ref 0.44–1.00)
GFR, Estimated: >60 mL/min (ref >60)
Glucose, Bld: 149 mg/dL — ABNORMAL HIGH (ref 70–99)
Potassium: 5.2 mmol/L — ABNORMAL HIGH (ref 3.5–5.1)
Sodium: 135 mmol/L (ref 135–145)

## 2022-10-13 LAB — PHOSPHORUS: Phosphorus: 4.1 mg/dL (ref 2.5–4.6)

## 2022-10-13 LAB — MAGNESIUM: Magnesium: 2.1 mg/dL (ref 1.7–2.4)

## 2022-10-13 MED ORDER — MIDODRINE HCL 5 MG PO TABS
10.0000 mg | ORAL_TABLET | Freq: Three times a day (TID) | ORAL | Status: DC
  Administered 2022-10-13 – 2022-10-15 (×7): 10 mg via ORAL
  Filled 2022-10-13 (×7): qty 2

## 2022-10-13 MED ORDER — VITAMIN D (ERGOCALCIFEROL) 1.25 MG (50000 UNIT) PO CAPS
50000.0000 [IU] | ORAL_CAPSULE | ORAL | Status: DC
  Filled 2022-10-13: qty 1

## 2022-10-13 MED ORDER — SODIUM CHLORIDE 0.9 % IV BOLUS
500.0000 mL | Freq: Once | INTRAVENOUS | Status: AC
  Administered 2022-10-13: 500 mL via INTRAVENOUS

## 2022-10-13 NOTE — NC FL2 (Signed)
MEDICAID FL2 LEVEL OF CARE FORM     IDENTIFICATION  Patient Name: Hannah [name] Birthdate: Jul 01, 1940 Sex: female Admission Date (Current Location): 09/25/2022  [facility] and [place] Number:  Chiropodist and Address:  [facility], [address] [place], [place] [address]      Provider Number: [id number]  Attending Physician Name and Address:  Gillis Santa, MD  Relative Name and Phone Number:       Current Level of Care: Hospital Recommended Level of Care: Skilled Nursing Facility Prior Approval Number:    Date Approved/Denied:   PASRR Number: [id number]  Discharge Plan: SNF    Current Diagnoses: Patient Active Problem List   Diagnosis Date Noted   Pressure injury of skin 10/03/2022   AKI (acute kidney injury) (HCC) 10/03/2022   Acute blood loss anemia 10/03/2022   Hyperkalemia 10/03/2022   Hyponatremia 10/03/2022   Pleural effusion on right 10/03/2022   Closed dislocation of cervical vertebra 10/01/2022   Hemorrhagic shock (HCC) 10/01/2022   Central cord syndrome (HCC) 08/28/2022   Neurogenic shock due to traumatic injury (HCC) 08/28/2022   C1 cervical fracture (HCC) 08/25/2022   C2 cervical fracture (HCC) 08/25/2022   Supratherapeutic INR 08/25/2022   Fall at home, initial encounter 08/25/2022   High anion gap metabolic acidosis 08/25/2022   Lactic acidosis 08/25/2022   Chronic diastolic CHF (congestive heart failure) (HCC) 08/25/2022   Subarachnoid hematoma (HCC) 08/24/2022   Depression, major, single episode, mild (HCC) 07/25/2022   Weakness of right lower extremity 04/25/2022   HTN (hypertension) 04/25/2022   Chronic systolic CHF (congestive heart failure) (HCC) 04/25/2022   Lower extremity weakness 04/25/2022   Right sided weakness 04/25/2022   S/P colostomy (HCC) 04/25/2022   NSTEMI (non-ST elevated myocardial infarction) (HCC) 04/25/2022   Elevated troponin    Dilated cardiomyopathy (HCC)    Ischemic  cardiomyopathy    Paroxysmal atrial fibrillation (HCC)    Non-ST elevation (NSTEMI) myocardial infarction (HCC) 12/25/2020   Thrombocytopenia (HCC) 04/23/2020   CAD (coronary artery disease) 06/24/2019   Hypertension 04/25/2019   Atypical chest pain 12/15/2018   CKD stage 3a, GFR 45-59 ml/min (HCC) 12/15/2018   Frequent falls 05/21/2018   HFimpEF (heart failure with improved ejection fraction) (HCC) 03/15/2017   Anticoagulated on warfarin    Pulmonary hypertension (HCC) 07/26/2016   Neuropathy 02/12/2015   History of pulmonary embolism 06/10/2013   Acquired hypothyroidism 06/06/2013   Diabetes mellitus with neurological manifestations, controlled (HCC) 06/06/2013   Hyperlipidemia 02/25/2013   DM2 (diabetes mellitus, type 2) (HCC) 07/23/2012    Orientation RESPIRATION BLADDER Height & Weight     Self, Place  Normal Indwelling catheter Weight: 91.6 kg Height:     BEHAVIORAL SYMPTOMS/MOOD NEUROLOGICAL BOWEL NUTRITION STATUS      Colostomy Diet  AMBULATORY STATUS COMMUNICATION OF NEEDS Skin   Extensive Assist Verbally PU Stage and Appropriate Care   PU Stage 2 Dressing: Daily (foam dressing)                   Personal Care Assistance Level of Assistance  Bathing, Feeding, Dressing, Total care Bathing Assistance: Maximum assistance Feeding assistance: Independent Dressing Assistance: Maximum assistance Total Care Assistance: Maximum assistance   Functional Limitations Info             SPECIAL CARE FACTORS FREQUENCY  PT (By licensed PT), OT (By licensed OT)     PT Frequency: 5x weekly OT Frequency: 5x weekly  Contractures Contractures Info: Not present    Additional Factors Info  Code Status, Allergies Code Status Info: DNR Allergies Info: augmentin, crestor, penicillins, tranxane           Current Medications (10/13/2022):  This is the current hospital active medication list Current Facility-Administered Medications  Medication Dose Route  Frequency Provider Last Rate Last Admin   acetaminophen (TYLENOL) tablet 650 mg  650 mg Oral Q6H PRN Theotis Burrow, NP   650 mg at 10/13/22 1610   Or   acetaminophen (TYLENOL) suppository 650 mg  650 mg Rectal Q6H PRN Theotis Burrow, NP       antiseptic oral rinse (BIOTENE) solution 15 mL  15 mL Topical PRN Theotis Burrow, NP       Chlorhexidine Gluconate Cloth 2 % PADS 6 each  6 each Topical Daily Georgeann Oppenheim, Sudheer B, MD   6 each at 10/12/22 1310   DULoxetine (CYMBALTA) DR capsule 30 mg  30 mg Oral Daily Lolita Patella B, MD   30 mg at 10/13/22 0845   enoxaparin (LOVENOX) injection 40 mg  40 mg Subcutaneous Q24H Lolita Patella B, MD   40 mg at 10/13/22 0844   feeding supplement (GLUCERNA 1.5 CAL) liquid 1,000 mL  1,000 mL Per Tube Continuous Lolita Patella B, MD 55 mL/hr at 10/12/22 1632 Infusion Verify at 10/12/22 1632   ferrous sulfate tablet 325 mg  325 mg Oral Q breakfast Lolita Patella B, MD   325 mg at 10/13/22 0845   folic acid (FOLVITE) tablet 1 mg  1 mg Oral Daily Gillis Santa, MD   1 mg at 10/13/22 0845   free water 100 mL  100 mL Per Tube Q4H Sreenath, Sudheer B, MD   100 mL at 10/13/22 0856   insulin aspart (novoLOG) injection 0-9 Units  0-9 Units Subcutaneous Q4H Lolita Patella B, MD   1 Units at 10/13/22 0845   midodrine (PROAMATINE) tablet 10 mg  10 mg Oral TID WC Gillis Santa, MD   10 mg at 10/13/22 0853   morphine (PF) 2 MG/ML injection 2 mg  2 mg Intravenous Q4H PRN Gillis Santa, MD   2 mg at 10/13/22 9604   nutrition supplement (JUVEN) (JUVEN) powder packet 1 packet  1 packet Per Tube BID BM Lolita Patella B, MD   1 packet at 10/13/22 0848   ondansetron (ZOFRAN-ODT) disintegrating tablet 4 mg  4 mg Oral Q6H PRN Theotis Burrow, NP       Or   ondansetron (ZOFRAN) injection 4 mg  4 mg Intravenous Q6H PRN Theotis Burrow, NP       pantoprazole (PROTONIX) injection 40 mg  40 mg Intravenous Q24H Lolita Patella B, MD   40 mg at 10/13/22 0845    polyethylene glycol (MIRALAX / GLYCOLAX) packet 17 g  17 g Oral Daily PRN Judithe Modest, NP       polyvinyl alcohol (LIQUIFILM TEARS) 1.4 % ophthalmic solution 1 drop  1 drop Both Eyes QID PRN Theotis Burrow, NP       sodium chloride flush (NS) 0.9 % injection 3 mL  3 mL Intravenous Q12H Lolita Patella B, MD   3 mL at 10/13/22 0844   Vitamin D (Ergocalciferol) (DRISDOL) 1.25 MG (50000 UNIT) capsule 50,000 Units  50,000 Units Oral Q7 days Gillis Santa, MD         Discharge Medications: Please see discharge summary for a list of discharge medications.  Relevant Imaging Results:  Relevant  Lab Results:   Additional Information SSN [id number]  Armanda Heritage, RN

## 2022-10-13 NOTE — TOC Progression Note (Signed)
Transition of Care [facility]) - Progression Note    Patient Details  Name: Hannah [name] MRN: [id number] Date of Birth: December 03, 1940  [facility] of [facility]) CM/SW Contact  Harriet Masson, RN Phone Number: 10/13/2022, 12:18 PM  Clinical Narrative:    FL@ complete and bed search extended to [facility].         Expected Discharge Plan and Services                                               Social Determinants of Health (SDOH) Interventions SDOH Screenings   Food Insecurity: No Food Insecurity (09/15/2022)   Received from [facility]  Housing: Low Risk  (05/20/2022)  Transportation Needs: No Transportation Needs (09/15/2022)   Received from [facility]  Utilities: Not At Risk (09/15/2022)   Received from [facility]  Alcohol Screen: Low Risk  (04/29/2022)  Depression (PHQ2-9): Low Risk  (09/13/2021)  Financial Resource Strain: Low Risk  (09/15/2022)   Received from [facility]  Physical Activity: Insufficiently Active (06/24/2021)  Social Connections: Moderately Integrated (06/24/2021)  Stress: No Stress Concern Present (06/24/2021)  Tobacco Use: Medium Risk (09/28/2022)    Readmission Risk Interventions    01/04/2021    1:14 PM  Readmission Risk Prevention Plan  Transportation Screening Complete  PCP or Specialist Appt within 5-7 Days --  Home Care Screening Complete  Medication Review (RN CM) Complete

## 2022-10-13 NOTE — Progress Notes (Addendum)
[facility] Progress Note  Patient: Hannah [name]    BJY:[id number]  DOA: 10/24/2022     Date of Service: the patient was seen and examined on 10/13/2022  Chief Complaint  Patient presents with   Abnormal Lab    Pt. To ED via EMS from [facility] for abnormal lab, Hgb of 6.8. unknown if any bleeding. Pt. Is baseline nonverbal, diabetic, CHF. Medic states they think pt. Had routine blood draw.   Brief hospital course: 82 y.o. female with past medical history significant for recent C1/C2 fracture and central cord syndrome approximately 1 month ago following a fall at home status post laminectomy at [facility], PE, HFpEF, CKD Stage IIIa, DM Type 2, HTN, paroxsymal A.fib, Hypothyroidism. Of note, she was brought to the emergency department on 09/28/2022 because of bleeding from a PICC line on her right arm. She was brought from the nursing home to the emergency department because of hemoglobin of 6.8 on 10/24/2022.    Of note, details of recent fracture are as follows: She had a fall on 8/21 with associated neck pain and bilateral weakness. She was admitted at [facility] where CT showed C1-C1 fracture and a small subarachnoid hemorrhage. MRI obtained showed C1 and C2 fracture, ossification of posterior longitudinal ligament, cervical spondylosis, cervical spinal cord injury, central cord syndrome.  Initial plan by neuro surgery at [facility] was for posterior cervical decompression, instrumentation and fusion from C1-C6 to be done on 8/28, but it was felt she would be best served by medically stabilizing and correcting coagulopathy prior surgery.  However on 8/25 family requested she be transferred to [facility] for Neurosurgical intervention there.     She was admitted to the ICU for hypovolemic/hemorrhagic/septic shock and severe anemia.  She was treated with IV fluids, empiric IV antibiotics and IV vasopressors.  She was also transfused with 2 units of packed red blood cells.  Workup also showed that  she had new C1 and C2 dislocation on top of recent C1 and C2 fractures with resultant severe spinal stenosis at C1-C2.  Dr. Katrinka Blazing, neurosurgeon, was consulted to assist with management.  He discussed the case with neurosurgeon at [facility] who recommended nonsurgical approach.   10/8: Patient was previously on full comfort measures however this has been rescinded by the family.  We are attempting to medically optimized and place patient in skilled nursing facility.  As of this date we do not have an accepting facility.   Assessment and Plan: Hypovolemic/hemorrhagic/septic shock.  Resolved   Autonomic dysfunction in the setting of recent C1 and C2 spine fracture s/p laminectomy at [facility], now with worsening dislocation and significant spinal stenosis Continue cervical collar. 10/9 patient was having discomfort, size needs to be changed.  Discussed with neurosurgery to reeval for cervical collar size. 10/10 cervical collar was changed by neurosurgery today.  DVT right upper extremity and left subclavian vein Right arm swelling, Keep right arm elevated Venous duplex: Nonocclusive deep venous thrombosis is noted in the right subclavian, axillary and brachial veins. Nonocclusive thrombus is also noted in the right basilic vein and left subclavian vein.  Severe sepsis of unknown source   Acute blood loss anemia s/p transfusion with 2 units of PRBCs    History of pulmonary embolism, patient is not on DOAC currently due to risk of bleeding Continue Lovenox for prophylaxis  Acute kidney injury on CKD stage IIIa   Acute metabolic encephalopathy, AAOx 1   Type 2 diabetes mellitus Hypoglycemia, resolved developed Continue  NovoLog sliding scale, monitor CBG  Right iliopsoas intramuscular hematoma   Chronic diastolic CHF Pulmonary hypertension 10/9 Lasix 40 mg x 1 dose followed by Lasix 40 mg twice daily Monitor renal functions and urine output Started diuresis due to  significant edema 10/10 discontinued Lasix today due to low blood pressure  Hypotension, started midodrine 10 mg p.o. 3 times daily with holding parameters Monitor BP and titrate medications accordingly  Hyperkalemia, K5.2, slightly elevated today 10/10 Hyponatremia, resolved  Hypothyroidism, Synthroid was discontinued, unknown reason.  Recommend to follow with PCP.  Hypophosphatemia, Phos repleted.  Resolved Monitor electrolytes and replete as needed.  Folic acid deficiency, started folic acid 1 mg p.o. daily Vitamin D deficiency: started vitamin D 50,000 units p.o. weekly, follow with PCP to repeat vitamin D level after 3 to 6 months.  Small to moderate right pleural effusion without injury atelectasis   PLAN Family meeting held between 2 daughters in person, 1 son over the phone, palliative care NP on 10/4.  Family reaffirms their desire for aggressive medical intervention.  Tube feeds have been initiated.  Laboratory workup on 10/5 and 10/8 have been relatively reassuring.  Stable electrolytes with normal kidney function.  No evidence of acute blood loss.  Hemoglobin low but stable.  Downtrending white blood cell count.     Will continue PT and OT evaluations as tolerated.  TOC following for placement options.  May need to extend bed search if local facilities are unable to offer a bed.     Body mass index is 31.63 kg/m.  Nutrition Problem: Inadequate oral intake Etiology: dysphagia, poor appetite Interventions: Interventions: Refer to RD note for recommendations, Magic cup, Tube feeding  Pressure Injury 10/01/22 Buttocks Stage 2 -  Partial thickness loss of dermis presenting as a shallow open injury with a red, pink wound bed without slough. (Active)  10/01/22 1559  Location: Buttocks  Location Orientation:   Staging: Stage 2 -  Partial thickness loss of dermis presenting as a shallow open injury with a red, pink wound bed without slough.  Wound Description (Comments):    Present on Admission: Yes  Dressing Type Foam - Lift dressing to assess site every shift 10/12/22 2251     Pressure Injury 10/01/22 Buttocks Left Stage 2 -  Partial thickness loss of dermis presenting as a shallow open injury with a red, pink wound bed without slough. (Active)  10/01/22 1602  Location: Buttocks  Location Orientation: Left  Staging: Stage 2 -  Partial thickness loss of dermis presenting as a shallow open injury with a red, pink wound bed without slough.  Wound Description (Comments):   Present on Admission: Yes  Dressing Type Foam - Lift dressing to assess site every shift 10/12/22 2251     Diet: Dysphagia 1 diet DVT Prophylaxis: Subcutaneous Lovenox   Advance goals of care discussion: DNR  Family Communication: family was not present at bedside, at the time of interview.  The pt provided permission to discuss medical plan with the family. Opportunity was given to ask question and all questions were answered satisfactorily.   Disposition:  Pt is from SNF, admitted with hemorrhagic shock, elevated WBC count, anemia, risk of GI bleeding.  Overall prognosis very poor.  We will continue bed search and discharge when bed will be available. Follow TOC for discharge planning.   Subjective: No significant events overnight, neck collar was changed by neurosurgeon, patient was seen by PT and OT and was complaining of severe pain in the neck 10/10,  RN was advised to give her IV pain medications and check frequently.  Patient is still unable to move her arms or legs.   Physical Exam: General: NAD, lying comfortably Appear in mild distress, affect depressed Eyes: PERRLA ENT: Oral Mucosa Clear, moist, cervical collar intact Neck: no JVD,  Cardiovascular: S1 and S2 Present, no Murmur,  Respiratory: good respiratory effort, Bilateral Air entry equal and Decreased, no Crackles, no wheezes Abdomen: Bowel Sound present, Soft and no tenderness, PEG tube intact Skin: no  rashes Extremities: RUE 4+ edema, LUE 2+ edema and mild Pedal edema, no calf tenderness Neurologic: Quadriplegic, power 1 x 5 bilateral upper and lower remedies, patient is only able to wiggle toes  Vitals:   10/13/22 0828 10/13/22 0830 10/13/22 0840 10/13/22 1053  BP: (!) 81/38 (!) 77/42 (!) 97/44 (!) 110/46  Pulse:  (!) 102  (!) 102  Resp:  14  18  Temp:  98.1 F (36.7 C)    TempSrc:      SpO2:  100%    Weight:        Intake/Output Summary (Last 24 hours) at 10/13/2022 1407 Last data filed at 10/12/2022 1632 Gross per 24 hour  Intake 812.6 ml  Output --  Net 812.6 ml   Filed Weights   10/10/22 0500 10/11/22 0430 10/12/22 0500  Weight: 91.5 kg 91.6 kg 91.6 kg    Data Reviewed: I have personally reviewed and interpreted daily labs, tele strips, imagings as discussed above. I reviewed all nursing notes, pharmacy notes, vitals, pertinent old records I have discussed plan of care as described above with RN and patient/family.  CBC: Recent Labs  Lab 10/07/22 1917 10/09/22 1324 10/12/22 0356  WBC 12.6* 12.7* 15.0*  NEUTROABS 10.2* 10.1* 12.6*  HGB 7.7* 7.8* 8.1*  HCT 25.2* 24.1* 26.9*  MCV 92.0 88.3 93.4  PLT 316 246 309   Basic Metabolic Panel: Recent Labs  Lab 10/07/22 1917 10/08/22 1757 10/09/22 0523 10/09/22 1324 10/09/22 1726 10/10/22 0519 10/11/22 0831 10/12/22 0356 10/12/22 0358 10/13/22 0704  NA 137  --   --  138  --   --  139 138  --  135  K 3.6  --   --  4.9  --   --  4.9 4.5  --  5.2*  CL 105  --   --  105  --   --  109 105  --  99  CO2 25  --   --  25  --   --  27 28  --  27  GLUCOSE 104*  --   --  189*  --   --  220* 154*  --  149*  BUN 27*  --   --  26*  --   --  26* 30*  --  49*  CREATININE 0.62  --   --  0.67  --   --  0.52 0.57  --  0.94  CALCIUM 8.3*  --   --  8.6*  --   --  8.2* 8.4*  --  8.2*  MG  --    < > 2.0  --  1.9 1.9 1.8  --  1.8 2.1  PHOS  --    < > 2.7 2.8  --  2.5  --   --  1.5* 4.1   < > = values in this interval not  displayed.    Studies: US Venous Img Upper Uni Right(DVT)  Result Date: 10/12/2022 CLINICAL DATA:  Right upper  extremity edema for 2 weeks. EXAM: Right UPPER EXTREMITY VENOUS DOPPLER ULTRASOUND TECHNIQUE: Gray-scale sonography with graded compression, as well as color Doppler and duplex ultrasound were performed to evaluate the upper extremity deep venous system from the level of the subclavian vein and including the jugular, axillary, basilic, radial, ulnar and upper cephalic vein. Spectral Doppler was utilized to evaluate flow at rest and with distal augmentation maneuvers. COMPARISON:  10/14/22. FINDINGS: Contralateral Subclavian Vein:  Nonocclusive thrombus is noted. Internal Jugular Vein: Not visualized due to patient positioning. Subclavian Vein: Nonocclusive thrombus is noted. Axillary Vein: Nonocclusive thrombus is noted. Cephalic Vein: No evidence of thrombus. Normal compressibility, respiratory phasicity and response to augmentation. Basilic Vein: Nonocclusive thrombus is noted. Brachial Veins: Nonocclusive thrombus is noted. Radial Veins: No evidence of thrombus. Normal compressibility, respiratory phasicity and response to augmentation. Ulnar Veins: No evidence of thrombus. Normal compressibility, respiratory phasicity and response to augmentation. Venous Reflux:  None visualized. Other Findings:  None visualized. IMPRESSION: Nonocclusive deep venous thrombosis is noted in the right subclavian, axillary and brachial veins. Nonocclusive thrombus is also noted in the right basilic vein and left subclavian vein. Electronically Signed   By: Lupita Raider M.D.   On: 10/12/2022 18:22    Scheduled Meds:  Chlorhexidine Gluconate Cloth  6 each Topical Daily   DULoxetine  30 mg Oral Daily   enoxaparin (LOVENOX) injection  40 mg Subcutaneous Q24H   ferrous sulfate  325 mg Oral Q breakfast   folic acid  1 mg Oral Daily   free water  100 mL Per Tube Q4H   insulin aspart  0-9 Units  Subcutaneous Q4H   midodrine  10 mg Oral TID WC   nutrition supplement (JUVEN)  1 packet Per Tube BID BM   pantoprazole (PROTONIX) IV  40 mg Intravenous Q24H   sodium chloride flush  3 mL Intravenous Q12H   Vitamin D (Ergocalciferol)  50,000 Units Oral Q7 days   Continuous Infusions:  feeding supplement (GLUCERNA 1.5 CAL) 55 mL/hr at 10/12/22 1632   PRN Meds: acetaminophen **OR** acetaminophen, antiseptic oral rinse, morphine injection, ondansetron **OR** ondansetron (ZOFRAN) IV, polyethylene glycol, polyvinyl alcohol  Time spent: 35 minutes  Author: Gillis Santa. MD [facility] 10/13/2022 2:07 PM  To reach On-call, see care teams to locate the attending and reach out to them via [email or web address]. If 7PM-7AM, please contact night-coverage If you still have difficulty reaching the attending provider, please page the [facility] (Director on Call) for [facility] on amion for assistance.

## 2022-10-13 NOTE — Progress Notes (Signed)
   10/13/22 1300  Spiritual Encounters  Type of Visit Follow up  Care provided to: Pt and family  Referral source Chaplain team;Family  Reason for visit Advance directives  OnCall Visit Yes   Chaplain helped family and patient complete HCPOA/Living Will. Document uploaded to system, placed in chart and copies provided to family.

## 2022-10-13 NOTE — [place] FL2 (Signed)
[place] MEDICAID FL2 LEVEL OF CARE FORM     IDENTIFICATION  Patient Name: Hannah [name] Birthdate: [birth year] Sex: female Admission Date (Current Location): 09/26/2022  [facility] and [place] Number:  Chiropodist and Address:  [facility], [address], [place], [place] [address]      Provider Number: [id number]  Attending Physician Name and Address:  Gillis Santa, MD  Relative Name and Phone Number:       Current Level of Care: Hospital Recommended Level of Care: Skilled Nursing Facility Prior Approval Number:    Date Approved/Denied:   PASRR Number: [id number]  Discharge Plan: SNF    Current Diagnoses: Patient Active Problem List   Diagnosis Date Noted   Pressure injury of skin 10/03/2022   AKI (acute kidney injury) (HCC) 10/03/2022   Acute blood loss anemia 10/03/2022   Hyperkalemia 10/03/2022   Hyponatremia 10/03/2022   Pleural effusion on right 10/03/2022   Closed dislocation of cervical vertebra 10/01/2022   Hemorrhagic shock (HCC) 09/13/2022   Central cord syndrome (HCC) 08/28/2022   Neurogenic shock due to traumatic injury (HCC) 08/28/2022   C1 cervical fracture (HCC) 08/25/2022   C2 cervical fracture (HCC) 08/25/2022   Supratherapeutic INR 08/25/2022   Fall at home, initial encounter 08/25/2022   High anion gap metabolic acidosis 08/25/2022   Lactic acidosis 08/25/2022   Chronic diastolic CHF (congestive heart failure) (HCC) 08/25/2022   Subarachnoid hematoma (HCC) 08/24/2022   Depression, major, single episode, mild (HCC) 07/25/2022   Weakness of right lower extremity 04/25/2022   HTN (hypertension) 04/25/2022   Chronic systolic CHF (congestive heart failure) (HCC) 04/25/2022   Lower extremity weakness 04/25/2022   Right sided weakness 04/25/2022   S/P colostomy (HCC) 04/25/2022   NSTEMI (non-ST elevated myocardial infarction) (HCC) 04/25/2022   Elevated troponin    Dilated cardiomyopathy (HCC)    Ischemic  cardiomyopathy    Paroxysmal atrial fibrillation (HCC)    Non-ST elevation (NSTEMI) myocardial infarction (HCC) 12/25/2020   Thrombocytopenia (HCC) 04/23/2020   CAD (coronary artery disease) 06/24/2019   Hypertension 04/25/2019   Atypical chest pain 12/15/2018   CKD stage 3a, GFR 45-59 ml/min (HCC) 12/15/2018   Frequent falls 05/21/2018   HFimpEF (heart failure with improved ejection fraction) (HCC) 03/15/2017   Anticoagulated on warfarin    Pulmonary hypertension (HCC) 07/26/2016   Neuropathy 02/12/2015   History of pulmonary embolism 06/10/2013   Acquired hypothyroidism 06/06/2013   Diabetes mellitus with neurological manifestations, controlled (HCC) 06/06/2013   Hyperlipidemia 02/25/2013   DM2 (diabetes mellitus, type 2) (HCC) 07/23/2012    Orientation RESPIRATION BLADDER Height & Weight     Self, Place  Normal Indwelling catheter Weight: 91.6 kg Height:     BEHAVIORAL SYMPTOMS/MOOD NEUROLOGICAL BOWEL NUTRITION STATUS      Colostomy Diet  AMBULATORY STATUS COMMUNICATION OF NEEDS Skin   Extensive Assist Verbally PU Stage and Appropriate Care   PU Stage 2 Dressing: Daily (foam dressing)                   Personal Care Assistance Level of Assistance  Bathing, Feeding, Dressing, Total care Bathing Assistance: Maximum assistance Feeding assistance: Independent Dressing Assistance: Maximum assistance Total Care Assistance: Maximum assistance   Functional Limitations Info             SPECIAL CARE FACTORS FREQUENCY  PT (By licensed PT), OT (By licensed OT)     PT Frequency: 5x weekly OT Frequency: 5x weekly  Contractures Contractures Info: Not present    Additional Factors Info  Code Status, Allergies Code Status Info: DNR Allergies Info: augmentin, crestor, penicillins, tranxane           Current Medications (10/13/2022):  This is the current hospital active medication list Current Facility-Administered Medications  Medication Dose Route  Frequency Provider Last Rate Last Admin   acetaminophen (TYLENOL) tablet 650 mg  650 mg Oral Q6H PRN Theotis Burrow, NP   650 mg at 10/13/22 6433   Or   acetaminophen (TYLENOL) suppository 650 mg  650 mg Rectal Q6H PRN Theotis Burrow, NP       antiseptic oral rinse (BIOTENE) solution 15 mL  15 mL Topical PRN Theotis Burrow, NP       Chlorhexidine Gluconate Cloth 2 % PADS 6 each  6 each Topical Daily Georgeann Oppenheim, Sudheer B, MD   6 each at 10/12/22 1310   DULoxetine (CYMBALTA) DR capsule 30 mg  30 mg Oral Daily Lolita Patella B, MD   30 mg at 10/13/22 0845   enoxaparin (LOVENOX) injection 40 mg  40 mg Subcutaneous Q24H Lolita Patella B, MD   40 mg at 10/13/22 0844   feeding supplement (GLUCERNA 1.5 CAL) liquid 1,000 mL  1,000 mL Per Tube Continuous Lolita Patella B, MD 55 mL/hr at 10/12/22 1632 Infusion Verify at 10/12/22 1632   ferrous sulfate tablet 325 mg  325 mg Oral Q breakfast Lolita Patella B, MD   325 mg at 10/13/22 0845   folic acid (FOLVITE) tablet 1 mg  1 mg Oral Daily Gillis Santa, MD   1 mg at 10/13/22 0845   free water 100 mL  100 mL Per Tube Q4H Sreenath, Sudheer B, MD   100 mL at 10/13/22 0856   insulin aspart (novoLOG) injection 0-9 Units  0-9 Units Subcutaneous Q4H Lolita Patella B, MD   1 Units at 10/13/22 0845   midodrine (PROAMATINE) tablet 10 mg  10 mg Oral TID WC Gillis Santa, MD   10 mg at 10/13/22 0853   morphine (PF) 2 MG/ML injection 2 mg  2 mg Intravenous Q4H PRN Gillis Santa, MD   2 mg at 10/13/22 2951   nutrition supplement (JUVEN) (JUVEN) powder packet 1 packet  1 packet Per Tube BID BM Lolita Patella B, MD   1 packet at 10/13/22 0848   ondansetron (ZOFRAN-ODT) disintegrating tablet 4 mg  4 mg Oral Q6H PRN Theotis Burrow, NP       Or   ondansetron (ZOFRAN) injection 4 mg  4 mg Intravenous Q6H PRN Theotis Burrow, NP       pantoprazole (PROTONIX) injection 40 mg  40 mg Intravenous Q24H Lolita Patella B, MD   40 mg at 10/13/22 0845    polyethylene glycol (MIRALAX / GLYCOLAX) packet 17 g  17 g Oral Daily PRN Judithe Modest, NP       polyvinyl alcohol (LIQUIFILM TEARS) 1.4 % ophthalmic solution 1 drop  1 drop Both Eyes QID PRN Theotis Burrow, NP       sodium chloride flush (NS) 0.9 % injection 3 mL  3 mL Intravenous Q12H Lolita Patella B, MD   3 mL at 10/13/22 0844   Vitamin D (Ergocalciferol) (DRISDOL) 1.25 MG (50000 UNIT) capsule 50,000 Units  50,000 Units Oral Q7 days Gillis Santa, MD         Discharge Medications: Please see discharge summary for a list of discharge medications.  Relevant Imaging Results:  Relevant  Lab Results:   Additional Information SSN [id number]  Harriet Masson, RN

## 2022-10-13 NOTE — TOC Progression Note (Signed)
Transition of Care [facility]) - Progression Note    Patient Details  Name: Hannah [name] MRN: [id number] Date of Birth: June 07, 1940  [facility] CM/SW Contact  Allena Katz, LCSW Phone Number: 10/13/2022, 3:17 PM  Clinical Narrative:   CSW spoke with michelle about bed offers. [name] will look these up and let me know tomorrow. MD reports pt is medically ready whenever she gets auth and a bed.          Expected Discharge Plan and Services                                               Social Determinants of Health (SDOH) Interventions SDOH Screenings   Food Insecurity: No Food Insecurity (09/15/2022)   Received from [facility]  Housing: Low Risk  (05/20/2022)  Transportation Needs: No Transportation Needs (09/15/2022)   Received from [facility]  Utilities: Not At Risk (09/15/2022)   Received from [facility]  Alcohol Screen: Low Risk  (04/29/2022)  Depression (PHQ2-9): Low Risk  (09/13/2021)  Financial Resource Strain: Low Risk  (09/15/2022)   Received from [facility]  Physical Activity: Insufficiently Active (06/24/2021)  Social Connections: Moderately Integrated (06/24/2021)  Stress: No Stress Concern Present (06/24/2021)  Tobacco Use: Medium Risk (09/28/2022)    Readmission Risk Interventions    01/04/2021    1:14 PM  Readmission Risk Prevention Plan  Transportation Screening Complete  PCP or Specialist Appt within 5-7 Days --  Home Care Screening Complete  Medication Review (RN CM) Complete

## 2022-10-13 NOTE — Progress Notes (Signed)
Nutrition Follow-up  DOCUMENTATION CODES:   Obesity unspecified  INTERVENTION:   -TF via g-tube:  Glucerna 1.5@ 55 ml/hr continuous    Free water flushes q4 hours    Regimen provides 1980kcal/day, 109g/day protein and 1002 ml/day of free water. Total free water: 1602 ml daily   -Continue 1 packet Juven BID via tube, each packet provides 95 calories, 2.5 grams of protein (collagen), and 9.8 grams of carbohydrate (3 grams sugar); also contains 7 grams of L-arginine and L-glutamine, 300 mg vitamin C, 15 mg vitamin E, 1.2 mcg vitamin B-12, 9.5 mg zinc, 200 mg calcium, and 1.5 g  Calcium Beta-hydroxy-Beta-methylbutyrate to support wound healing   NUTRITION DIAGNOSIS:   Inadequate oral intake related to dysphagia, poor appetite as evidenced by meal completion < 25%, other (comment) (pt requires G-tube and nutrition support).  Ongoing  GOAL:   Patient will meet greater than or equal to 90% of their needs  Met with TF  MONITOR:   PO intake, Labs, Weight trends, TF tolerance, Skin, I & O's  REASON FOR ASSESSMENT:   Low Braden    ASSESSMENT:   82 y.o. female with a history of PE, HFrEF, CKD3, DM2 with neuropathy, HLD, HTN, pulmonary hypertension, CAD, NSTEMI, SAH, hypothyroid, large bowel stricture s/p colectomy (2016), pAF and recent admission to [facility] for mechanical fall on 8/21, found to have C1 and C2 fractures (s/p C1-2 sublaminar wiring, C3 laminectomy and C4-6 laminoplasty complicated by dysphagia and poor oral intake requiring IR G-tube 9/18) and who is admitted with hemorrhagic shock secondary to right iliopsoas intramuscular hematoma.  10/10- cervical collar changed  Reviewed I/O's: +1.1 L x 24 hours and +4.9 L since admission  Pt receiving personal care at time of visit.   Pt on a dysphagia 1 diet with thin liquids, however, noted meal completions 0%. Pt receives the majority of nutrition via PEG. Glucerna 1.5 infusing at goal rate of 55 ml/hr.   Per TOC  notes, plan to d/c to SNF once bed is available.    Medications reviewed and include ferrous sulfate, folic acid, and vitamin D.   Labs reviewed: K: 5.2, CBGS: 133-174 (inpatient orders for glycemic control are 0-9 units insulin aspart every 4 hours).    Diet Order:   Diet Order             DIET - DYS 1 Room service appropriate? Yes with Assist; Fluid consistency: Thin  Diet effective now                   EDUCATION NEEDS:   Not appropriate for education at this time  Skin:  Skin Assessment: Skin Integrity Issues: Skin Integrity Issues:: Stage II Stage II: Buttocks  Last BM:  10/11/22 (25 ml via colostomy)  Height:   Ht Readings from Last 1 Encounters:  09/28/22 5\' 7"  (1.702 m)    Weight:   Wt Readings from Last 1 Encounters:  10/12/22 91.6 kg    Ideal Body Weight:  61.36 kg  BMI:  Body mass index is 31.63 kg/m.  Estimated Nutritional Needs:   Kcal:  1800-2100kcal/day  Protein:  90-105g/day  Fluid:  1.7-1.9L/day    Levada Schilling, RD, LDN, CDCES Registered Dietitian III Certified Diabetes Care and Education Specialist Please refer to [facility] for RD and/or RD on-call/weekend/after hours pager

## 2022-10-13 NOTE — Progress Notes (Signed)
Occupational Therapy Treatment Patient Details Name: Hannah [name] MRN: [id number] DOB: October 29, 1940 Today's Date: 10/13/2022   History of present illness Pt. is baseline nonverbal, diabetic, CHF, a-fib. Complicated patient with C1-C2 fracture following a fall and prolonged hospitalization at [facility], recently discharged to [facility] now admitted with multi-factorial shock and altered mental status. CT on admission reveals known recent C1 and C2 fractures with increased fracture  displacement and angulation including new complete posterior  dislocation of the lateral masses of C1 relative to C2. Resultant  severe spinal stenosis at C1-2. Per secure chat with Dr. Katrinka Blazing Neurosurgery states no further tx planned, ordering soft collar.   OT comments  Chart reviewed to date, tx session targeted improving positioning and participation in ADL/mobility tasks. Pt appears lethargic however opens eyes and will respond yes/no with questions. Pt is grimacing in pain with movement.  Soft collar adjusted with pt reporting improved fit however pt continues to present with lateral cervical flexion. Pt requires MAX-TOTAL A +1-2 for all ADLs, Massage provided for edema management throughout BUE. BUE placed in elevation to facilitate decreased edema. HOH A RUE required for washing face. Nurse educated on positioning. Attempted to provide pt with pancake call button, limited R/LE hand movement to push. Will continue to assess. Pt is left as received, OT will follow acutely.       If plan is discharge home, recommend the following:  Two people to help with walking and/or transfers;Two people to help with bathing/dressing/bathroom;Assistance with cooking/housework;Assist for transportation;Help with stairs or ramp for entrance;Direct supervision/assist for financial management;Direct supervision/assist for medications management   Equipment Recommendations  Other (comment) (defer to next venue of care)    Recommendations for  Other Services      Precautions / Restrictions Precautions Precautions: Cervical;Fall Precaution Comments: C1-2 fracture and disclocation. Conservative management Required Braces or Orthoses: Cervical Brace Cervical Brace: Soft collar Restrictions Weight Bearing Restrictions: No       Mobility Bed Mobility Overal bed mobility: Needs Assistance          General bed mobility comments: semi supine to sit with TOTAL A +2    Transfers                         Balance     Sitting balance-Leahy Scale: Zero                                     ADL either performed or assessed with clinical judgement   ADL Overall ADL's : Needs assistance/impaired     Grooming: Maximal assistance Grooming Details (indicate cue type and reason): HOH A             Lower Body Dressing: Total assistance       Toileting- Clothing Manipulation and Hygiene: Total assistance         General ADL Comments: MAX-TOTAL A +0-8 for self care; TOTAL A+2 to adjust soft collar; MAX A for lotion application    Extremity/Trunk Assessment Upper Extremity Assessment Upper Extremity Assessment: RUE deficits/detail;LUE deficits/detail RUE Deficits / Details: Trace bilateral digit flexion/extesion. No consistent active ROM elicited upon command. LUE Deficits / Details: trace movement throughout, potentially limited by pain/lethargy   Lower Extremity Assessment RLE Deficits / Details: wiggles toes only     BUE function appears the same pre/post session   Vision       Perception  Praxis      Cognition Arousal: Lethargic Behavior During Therapy: Flat affect Overall Cognitive Status: No family/caregiver present to determine baseline cognitive functioning Area of Impairment: Attention, Following commands, Awareness, Problem solving                   Current Attention Level: Sustained   Following Commands: Follows one step commands inconsistently      Problem Solving: Slow processing, Difficulty sequencing, Requires verbal cues, Requires tactile cues          Exercises Other Exercises Other Exercises: edema managment via positioning and massage BUE with decreased edema noted Other Exercises: discussion with neurosurgeon re: brace positioning P/AAROM to R shoulder, elbow, wrist digits for joint protection and mobilization    Shoulder Instructions       General Comments BUE edema noted, L wrist bands noted to be tight on L wrist, nurse in room and notified; all lines/leads, peg, foley intact pre/post session    Pertinent Vitals/ Pain       Pain Assessment Pain Assessment: 0-10 Pain Score: 10-Worst pain ever Pain Location: grimacing, reports pain in her back Pain Intervention(s): Limited activity within patient's tolerance, Monitored during session, Repositioned  Home Living                                          Prior Functioning/Environment              Frequency  Min 1X/week        Progress Toward Goals  OT Goals(current goals can now be found in the care plan section)  Progress towards OT goals: OT to reassess next treatment  Acute Rehab OT Goals Patient Stated Goal: to go to [facility] OT Goal Formulation: With patient  Plan      Co-evaluation                 AM-PAC OT "6 Clicks" Daily Activity     Outcome Measure   Help from another person eating meals?: Total Help from another person taking care of personal grooming?: Total Help from another person toileting, which includes using toliet, bedpan, or urinal?: Total Help from another person bathing (including washing, rinsing, drying)?: Total Help from another person to put on and taking off regular upper body clothing?: Total Help from another person to put on and taking off regular lower body clothing?: Total 6 Click Score: 6    End of Session Equipment Utilized During Treatment: Cervical collar  OT Visit Diagnosis:  History of falling (Z91.81);Muscle weakness (generalized) (M62.81);Pain;Unsteadiness on feet (R26.81)   Activity Tolerance Patient limited by pain   Patient Left in bed;with call bell/phone within reach;with bed alarm set   Nurse Communication Mobility status (brace positioning)        Time: 1308-6578 OT Time Calculation (min): 56 min  Charges: OT General Charges $OT Visit: 1 Visit OT Treatments $Self Care/Home Management : 8-22 mins $Therapeutic Activity: 38-52 mins  Oleta Mouse, OTD OTR/L  10/13/22, 12:14 PM

## 2022-10-14 DIAGNOSIS — R578 Other shock: Secondary | ICD-10-CM | POA: Diagnosis not present

## 2022-10-14 LAB — GLUCOSE, CAPILLARY
Glucose-Capillary: 103 mg/dL — ABNORMAL HIGH (ref 70–99)
Glucose-Capillary: 112 mg/dL — ABNORMAL HIGH (ref 70–99)
Glucose-Capillary: 114 mg/dL — ABNORMAL HIGH (ref 70–99)
Glucose-Capillary: 119 mg/dL — ABNORMAL HIGH (ref 70–99)
Glucose-Capillary: 91 mg/dL (ref 70–99)
Glucose-Capillary: 98 mg/dL (ref 70–99)

## 2022-10-14 LAB — BASIC METABOLIC PANEL
Anion gap: 8 (ref 5–15)
BUN: 63 mg/dL — ABNORMAL HIGH (ref 8–23)
CO2: 28 mmol/L (ref 22–32)
Calcium: 8.5 mg/dL — ABNORMAL LOW (ref 8.9–10.3)
Chloride: 100 mmol/L (ref 98–111)
Creatinine, Ser: 0.78 mg/dL (ref 0.44–1.00)
GFR, Estimated: >60 mL/min (ref >60)
Glucose, Bld: 118 mg/dL — ABNORMAL HIGH (ref 70–99)
Potassium: 5.2 mmol/L — ABNORMAL HIGH (ref 3.5–5.1)
Sodium: 136 mmol/L (ref 135–145)

## 2022-10-14 LAB — CBC
HCT: 25.6 % — ABNORMAL LOW (ref 36.0–46.0)
Hemoglobin: 8 g/dL — ABNORMAL LOW (ref 12.0–15.0)
MCH: 28.7 pg (ref 26.0–34.0)
MCHC: 31.3 g/dL (ref 30.0–36.0)
MCV: 91.8 fL (ref 80.0–100.0)
Platelets: 286 10*3/uL (ref 150–400)
RBC: 2.79 MIL/uL — ABNORMAL LOW (ref 3.87–5.11)
RDW: 20.9 % — ABNORMAL HIGH (ref 11.5–15.5)
WBC: 16.1 10*3/uL — ABNORMAL HIGH (ref 4.0–10.5)
nRBC: 0.2 % (ref 0.0–0.2)

## 2022-10-14 LAB — MAGNESIUM: Magnesium: 2.1 mg/dL (ref 1.7–2.4)

## 2022-10-14 LAB — PROTIME-INR
INR: 1 (ref 0.8–1.2)
Prothrombin Time: 13.2 s (ref 11.4–15.2)

## 2022-10-14 LAB — HEPARIN LEVEL (UNFRACTIONATED): Heparin Unfractionated: >1.1 [IU]/mL — ABNORMAL HIGH (ref 0.30–0.70)

## 2022-10-14 LAB — PHOSPHORUS: Phosphorus: 3.5 mg/dL (ref 2.5–4.6)

## 2022-10-14 LAB — APTT: aPTT: 31 s (ref 24–36)

## 2022-10-14 MED ORDER — HEPARIN BOLUS VIA INFUSION
4900.0000 [IU] | Freq: Once | INTRAVENOUS | Status: AC
  Administered 2022-10-14: 4900 [IU] via INTRAVENOUS
  Filled 2022-10-14: qty 4900

## 2022-10-14 MED ORDER — HEPARIN (PORCINE) 25000 UT/250ML-% IV SOLN
1150.0000 [IU]/h | INTRAVENOUS | Status: DC
  Administered 2022-10-14 – 2022-10-15 (×2): 1150 [IU]/h via INTRAVENOUS
  Filled 2022-10-14: qty 250

## 2022-10-14 MED ORDER — HEPARIN (PORCINE) 25000 UT/250ML-% IV SOLN
1350.0000 [IU]/h | INTRAVENOUS | Status: DC
  Administered 2022-10-14: 1350 [IU]/h via INTRAVENOUS
  Filled 2022-10-14: qty 250

## 2022-10-14 NOTE — TOC Progression Note (Signed)
Transition of Care [facility]) - Progression Note    Patient Details  Name: Hannah [name] MRN: [id number] Date of Birth: 13-Apr-1940  Transition of Care [facility]) CM/SW Contact  Garret Reddish, RN Phone Number: 10/14/2022, 5:59 PM  Clinical Narrative:    Chart reviewed.  Noted that patient have DVT and has been started on IV Heparin on today.  I have discussed bed offers with Elon Jester.  Elon Jester reports that she has not selected a bed at this time. She wants to continue to research the facilities more before she makes a decision on a bed.  She has sent the bed offers to her brother and her sister to also receive their input.  I also discussed Hospice and the services that Hospice can provide.  We discussed LTC.  I informed Elon Jester that LTC bed would require that the family apply for Medicaid to see if patient would qualify or need to complete a spend down.  I encouraged Elon Jester to apply for Medicaid for [name] as the family is not able to care for [name] at home.  I have informed Elon Jester that Medicare only pays for   Short-term rehab and will not cover any long-term care expenses.  I als made her aware that even if patient went to a facility with Hospice following Medicare would only pay for the Hospice services but not for room and board.  I have also made her aware that patient would have to meet inpatient Hospice Criteria for a Hospice Home in order for Medicare to cover inpatient Hospice stay which is usually for end of life needs. Elon Jester is considering speaking with Hospice again to review all available services that they can provide.    TOC will continue to for discharge planning.   I have spoken with patient's daughter [name].          Expected Discharge Plan and Services                                               Social Determinants of Health (SDOH) Interventions SDOH Screenings   Food Insecurity: No Food Insecurity (09/15/2022)   Received from [facility]  Housing: Low Risk  (05/20/2022)  Transportation Needs: No Transportation Needs (09/15/2022)   Received from [facility]  Utilities: Not At Risk (09/15/2022)   Received from [facility]  Alcohol Screen: Low Risk  (04/29/2022)  Depression (PHQ2-9): Low Risk  (09/13/2021)  Financial Resource Strain: Low Risk  (09/15/2022)   Received from [facility]  Physical Activity: Insufficiently Active (06/24/2021)  Social Connections: Moderately Integrated (06/24/2021)  Stress: No Stress Concern Present (06/24/2021)  Tobacco Use: Medium Risk (09/28/2022)    Readmission Risk Interventions    01/04/2021    1:14 PM  Readmission Risk Prevention Plan  Transportation Screening Complete  PCP or Specialist Appt within 5-7 Days --  Home Care Screening Complete  Medication Review (RN CM) Complete

## 2022-10-14 NOTE — Progress Notes (Addendum)
[facility] Progress Note  Patient: Keymiah Porcayo    UJW:[id number]  DOA: 08-Oct-2022     Date of Service: the patient was seen and examined on 10/14/2022  Chief Complaint  Patient presents with   Abnormal Lab    Pt. To ED via EMS from [facility] for abnormal lab, Hgb of 6.8. unknown if any bleeding. Pt. Is baseline nonverbal, diabetic, CHF. Medic states they think pt. Had routine blood draw.   Brief hospital course: 82 y.o. female with past medical history significant for recent C1/C2 fracture and central cord syndrome approximately 1 month ago following a fall at home status post laminectomy at [facility], PE, HFpEF, CKD Stage IIIa, DM Type 2, HTN, paroxsymal A.fib, Hypothyroidism. Of note, she was brought to the emergency department on 09/28/2022 because of bleeding from a PICC line on her right arm. She was brought from the nursing home to the emergency department because of hemoglobin of 6.8 on 2022-10-08.    Of note, details of recent fracture are as follows: She had a fall on 8/21 with associated neck pain and bilateral weakness. She was admitted at [facility] where CT showed C1-C1 fracture and a small subarachnoid hemorrhage. MRI obtained showed C1 and C2 fracture, ossification of posterior longitudinal ligament, cervical spondylosis, cervical spinal cord injury, central cord syndrome.  Initial plan by neuro surgery at [facility] was for posterior cervical decompression, instrumentation and fusion from C1-C6 to be done on 8/28, but it was felt she would be best served by medically stabilizing and correcting coagulopathy prior surgery.  However on 8/25 family requested she be transferred to [facility] for Neurosurgical intervention there.     She was admitted to the ICU for hypovolemic/hemorrhagic/septic shock and severe anemia.  She was treated with IV fluids, empiric IV antibiotics and IV vasopressors.  She was also transfused with 2 units of packed red blood cells.  Workup also showed that  she had new C1 and C2 dislocation on top of recent C1 and C2 fractures with resultant severe spinal stenosis at C1-C2.  Dr. Katrinka Blazing, neurosurgeon, was consulted to assist with management.  He discussed the case with neurosurgeon at [facility] who recommended nonsurgical approach.   10/8: Patient was previously on full comfort measures however this has been rescinded by the family.  We are attempting to medically optimized and place patient in skilled nursing facility.  As of this date we do not have an accepting facility.   Assessment and Plan: Hypovolemic/hemorrhagic/septic shock.  Resolved   Autonomic dysfunction in the setting of recent C1 and C2 spine fracture s/p laminectomy at [facility], now with worsening dislocation and significant spinal stenosis Continue cervical collar. 10/9 patient was having discomfort, size needs to be changed.  Discussed with neurosurgery to reeval for cervical collar size. 10/10 cervical collar was changed by neurosurgery today.  DVT right upper extremity and left subclavian vein Right arm swelling, Keep right arm elevated Venous duplex: Nonocclusive deep venous thrombosis is noted in the right subclavian, axillary and brachial veins. Nonocclusive thrombus is also noted in the right basilic vein and left subclavian vein. 10/11 started heparin IV infusion, pharmacy consulted for dosing and PTT monitoring as per protocol Monitor H&H   History of pulmonary embolism, patient is not on DOAC currently due to risk of bleeding S/p Lovenox for prophylaxis 10/11 started heparin IV infusion as above, pharmacy consulted for dosing and PTT monitoring  Severe sepsis of unknown source   Acute blood loss anemia s/p transfusion  with 2 units of PRBCs   Acute kidney injury on CKD stage IIIa   Acute metabolic encephalopathy, AAOx 1   Type 2 diabetes mellitus Hypoglycemia, resolved developed Continue NovoLog sliding scale, monitor CBG  Right iliopsoas  intramuscular hematoma   Chronic diastolic CHF Pulmonary hypertension 10/9 Lasix 40 mg x 1 dose followed by Lasix 40 mg twice daily Monitor renal functions and urine output Started diuresis due to significant edema 10/10 discontinued Lasix today due to low blood pressure  Hypotension, started midodrine 10 mg p.o. 3 times daily with holding parameters Monitor BP and titrate medications accordingly  Hyperkalemia, K5.2, slightly elevated today 10/10 Hyponatremia, resolved Hypophosphatemia, Phos repleted.  Resolved Monitor electrolytes and replete as needed.  Hypothyroidism, Synthroid was discontinued, unknown reason.  Recommend to follow with PCP.  Folic acid deficiency, started folic acid 1 mg p.o. daily Vitamin D deficiency: started vitamin D 50,000 units p.o. weekly, follow with PCP to repeat vitamin D level after 3 to 6 months.  Right pleural effusion Small to moderate right pleural effusion without injury atelectasis  PLAN Family meeting held between 2 daughters in person, 1 son over the phone, palliative care NP on 10/4.  Family reaffirms their desire for aggressive medical intervention.  Tube feeds have been initiated.  Laboratory workup on 10/5 and 10/8 have been relatively reassuring.  Stable electrolytes with normal kidney function.  No evidence of acute blood loss.  Hemoglobin low but stable.  Downtrending white blood cell count.     Will continue PT and OT evaluations as tolerated.  TOC following for placement options.  May need to extend bed search if local facilities are unable to offer a bed.    10/11 d/w patient's daughter [name] regarding new finding of DVT and anticoagulation.  She agreed to start heparin IV infusion and continue current treatment.  Body mass index is 31.62 kg/m.  Nutrition Problem: Inadequate oral intake Etiology: dysphagia, poor appetite Interventions: Interventions: Refer to RD note for recommendations, Magic cup, Tube feeding  Pressure  Injury 10/01/22 Buttocks Stage 2 -  Partial thickness loss of dermis presenting as a shallow open injury with a red, pink wound bed without slough. (Active)  10/01/22 1559  Location: Buttocks  Location Orientation:   Staging: Stage 2 -  Partial thickness loss of dermis presenting as a shallow open injury with a red, pink wound bed without slough.  Wound Description (Comments):   Present on Admission: Yes  Dressing Type Foam - Lift dressing to assess site every shift 10/14/22 0101     Pressure Injury 10/01/22 Buttocks Left Stage 2 -  Partial thickness loss of dermis presenting as a shallow open injury with a red, pink wound bed without slough. (Active)  10/01/22 1602  Location: Buttocks  Location Orientation: Left  Staging: Stage 2 -  Partial thickness loss of dermis presenting as a shallow open injury with a red, pink wound bed without slough.  Wound Description (Comments):   Present on Admission: Yes  Dressing Type Foam - Lift dressing to assess site every shift 10/14/22 0101     Diet: Dysphagia 1 diet DVT Prophylaxis: Subcutaneous Lovenox   Advance goals of care discussion: DNR  Family Communication: family was not present at bedside, at the time of interview.  The pt provided permission to discuss medical plan with the family. Opportunity was given to ask question and all questions were answered satisfactorily.  10/11 discussed with patient's daughter over the phone, management plan discussed.  She wanted to start  heparin IV infusion, risk and benefit explained.   Disposition:  Pt is from SNF, admitted with hemorrhagic shock, elevated WBC count, anemia, risk of GI bleeding.  Overall prognosis very poor.  On 10/11 started heparin IV infusion as per patient's daughter.  We will continue bed search and discharge when bed will be available. Follow TOC for discharge planning.   Subjective: No significant events overnight, patient is AO x 1, knows her name and date of birth, she knows  that she is in the hospital and said [facility], does not know month and year. Patient is swollen 10 out of 10 pain.  Goals of care discussed but she said she does not know Unable to move arms or legs.  Physical Exam: General: NAD, lying comfortably Appear in mild distress, affect depressed Eyes: PERRLA ENT: Oral Mucosa Clear, moist, cervical collar intact Neck: no JVD,  Cardiovascular: S1 and S2 Present, no Murmur,  Respiratory: good respiratory effort, Bilateral Air entry equal and Decreased, no Crackles, no wheezes Abdomen: Bowel Sound present, Soft and no tenderness, PEG tube intact Skin: no rashes Extremities: RUE 4+ edema, LUE 2+ edema and mild Pedal edema, no calf tenderness Neurologic: Quadriplegic, power 1 x 5 bilateral upper and lower remedies, patient is only able to wiggle toes  Vitals:   10/13/22 2009 10/14/22 0503 10/14/22 0806 10/14/22 1200  BP: (!) 99/42 (!) 109/46 108/65   Pulse: 90 75 87   Resp: 18 20 19    Temp: 97.9 F (36.6 C) (!) 97.5 F (36.4 C) 97.9 F (36.6 C)   TempSrc:      SpO2: 99% 100% 99%   Weight:    91.6 kg  Height:    5' 7.01" (1.702 m)   No intake or output data in the 24 hours ending 10/14/22 1329  Filed Weights   10/11/22 0430 10/12/22 0500 10/14/22 1200  Weight: 91.6 kg 91.6 kg 91.6 kg    Data Reviewed: I have personally reviewed and interpreted daily labs, tele strips, imagings as discussed above. I reviewed all nursing notes, pharmacy notes, vitals, pertinent old records I have discussed plan of care as described above with RN and patient/family.  CBC: Recent Labs  Lab 10/07/22 1917 10/09/22 1324 10/12/22 0356 10/14/22 0425  WBC 12.6* 12.7* 15.0* 16.1*  NEUTROABS 10.2* 10.1* 12.6*  --   HGB 7.7* 7.8* 8.1* 8.0*  HCT 25.2* 24.1* 26.9* 25.6*  MCV 92.0 88.3 93.4 91.8  PLT 316 246 309 286   Basic Metabolic Panel: Recent Labs  Lab 10/09/22 1324 10/09/22 1726 10/10/22 0519 10/11/22 0831 10/12/22 0356 10/12/22 0358  10/13/22 0704 10/14/22 0425  NA 138  --   --  139 138  --  135 136  K 4.9  --   --  4.9 4.5  --  5.2* 5.2*  CL 105  --   --  109 105  --  99 100  CO2 25  --   --  27 28  --  27 28  GLUCOSE 189*  --   --  220* 154*  --  149* 118*  BUN 26*  --   --  26* 30*  --  49* 63*  CREATININE 0.67  --   --  0.52 0.57  --  0.94 0.78  CALCIUM 8.6*  --   --  8.2* 8.4*  --  8.2* 8.5*  MG  --    < > 1.9 1.8  --  1.8 2.1 2.1  PHOS 2.8  --  2.5  --   --  1.5* 4.1 3.5   < > = values in this interval not displayed.    Studies: No results found.  Scheduled Meds:  Chlorhexidine Gluconate Cloth  6 each Topical Daily   DULoxetine  30 mg Oral Daily   ferrous sulfate  325 mg Oral Q breakfast   folic acid  1 mg Oral Daily   free water  100 mL Per Tube Q4H   insulin aspart  0-9 Units Subcutaneous Q4H   midodrine  10 mg Oral TID WC   nutrition supplement (JUVEN)  1 packet Per Tube BID BM   pantoprazole (PROTONIX) IV  40 mg Intravenous Q24H   sodium chloride flush  3 mL Intravenous Q12H   Vitamin D (Ergocalciferol)  50,000 Units Oral Q7 days   Continuous Infusions:  feeding supplement (GLUCERNA 1.5 CAL) 55 mL/hr at 10/12/22 1632   heparin 1,350 Units/hr (10/14/22 1252)   PRN Meds: acetaminophen **OR** acetaminophen, antiseptic oral rinse, morphine injection, ondansetron **OR** ondansetron (ZOFRAN) IV, polyethylene glycol, polyvinyl alcohol  Time spent: 55 minutes  Author: Gillis Santa. MD [facility] 10/14/2022 1:29 PM  To reach On-call, see care teams to locate the attending and reach out to them via [email or web address]. If 7PM-7AM, please contact night-coverage If you still have difficulty reaching the attending provider, please page the [facility] (Director on Call) for [facility] on amion for assistance.

## 2022-10-14 NOTE — Progress Notes (Signed)
PHARMACY - ANTICOAGULATION CONSULT NOTE  Pharmacy Consult for Heparin Infusion Indication: DVT  Allergies  Allergen Reactions   Augmentin [Amoxicillin-Pot Clavulanate] Itching and Other (See Comments)    Severe vaginal itching   Tranxene [Clorazepate] Itching   Crestor [Rosuvastatin]     Made her sick on stomach, she is able to tolerate the zocor   Penicillins Itching and Rash    Patient Measurements: Height: 5' 7.01" (170.2 cm) Weight: 91.6 kg (201 lb 15.1 oz) IBW/kg (Calculated) : 61.62 Heparin Dosing Weight: 81.4 kg  Vital Signs: Temp: 97.5 F (36.4 C) (10/11 2007) Temp Source: Oral (10/11 2007) BP: 119/79 (10/11 2007) Pulse Rate: 85 (10/11 2007)  Labs: Recent Labs    10/12/22 0356 10/13/22 0704 10/14/22 0425 10/14/22 1243 10/14/22 2043  HGB 8.1*  --  8.0*  --   --   HCT 26.9*  --  25.6*  --   --   PLT 309  --  286  --   --   APTT  --   --   --  31  --   LABPROT  --   --   --  13.2  --   INR  --   --   --  1.0  --   HEPARINUNFRC  --   --   --   --  >1.10*  CREATININE 0.57 0.94 0.78  --   --     Estimated Creatinine Clearance: 64.1 mL/min (by C-G formula based on SCr of 0.78 mg/dL).   Medical History: Past Medical History:  Diagnosis Date   Acute massive pulmonary embolism (HCC) 07/13/2012   Massive PE w/ PEA arrest 07/13/12 >TNK >IVC filter >discharged on comadin     Anticoagulated on warfarin    Chronic diastolic CHF (congestive heart failure) (HCC) 03/15/2017   Chronic kidney disease, stage 3a (HCC) 12/15/2018   Diabetes mellitus, type 2 (HCC) 07/15/2012   with peripheral neuropathy   DM2 (diabetes mellitus, type 2) (HCC) 07/23/2012   HFrEF (heart failure with reduced ejection fraction) (HCC) 03/15/2017   Non-ischemic cardiomyopathy // Cardiac catheterization 01/01/21:  RCA prox to mid 20 // Echocardiogram 12/26/20:  EF 30-35, ant septum/apex, mid inf-sept AK; ant-lat/ant/inf HK, Gr 1 DD, mild LVH, normal RVSF, RVSP 27.2, small effusion (ant to RV), mild  MR, mod TR, AV sclerosis w/o AS   Hyperlipemia 07/14/2012   Hyperlipidemia 02/25/2013   Hypertension 07/14/2012   Hypothyroid 06/06/2013   Large bowel stricture (HCC)    s/p colectomy in 2016   Neuropathy 02/12/2015   Osteoarthritis    s/p hip and knee replacements   PAF (paroxysmal atrial fibrillation) (HCC)    Pulmonary hypertension (HCC) 07/26/2016   Syncope 10/2018   Thyroid disease     Assessment: Hannah [name] is a 82 y.o. female presenting with hemorrhagic shock. Imaging reveled DVT of right upper extremity and left subclavian vein. PMH significant for pulmonary HTN, CKD, afib, and chronic diastolic CHF. Pharmacy has been consulted to initiate patient on a heparin infusion. No anticoagulation PTA and patient has only been on prophylaxis dose of enoxaparin inpatient (last dose 10/11 @ 0845).  Per conversation with MD, will bolus patient even though she had an initial presentation of acute blood loss anemia.  10/11 20:43  HL >1.10   SUPRAtherapeutic  Goal of Therapy:  Heparin level 0.3-0.7 units/ml Monitor platelets by anticoagulation protocol: Yes   Plan:  - HL >1.10 is supratherapeutic, will hold heparin infusion for 1 hour, messaged RN - Decrease Heparin  to 1150 units/hr after holding for 1 hour - Next HL 8 hours after restart - Daily CBC while on Heparin infusion  Clovia Cuff, PharmD, BCPS 10/14/2022 10:01 PM

## 2022-10-14 NOTE — Progress Notes (Signed)
PHARMACY - ANTICOAGULATION CONSULT NOTE  Pharmacy Consult for Heparin Infusion Indication: DVT  Allergies  Allergen Reactions   Augmentin [Amoxicillin-Pot Clavulanate] Itching and Other (See Comments)    Severe vaginal itching   Tranxene [Clorazepate] Itching   Crestor [Rosuvastatin]     Made her sick on stomach, she is able to tolerate the zocor   Penicillins Itching and Rash    Patient Measurements: Weight: 91.6 kg (201 lb 15.1 oz) Heparin Dosing Weight: 81.4 kg  Vital Signs: Temp: 97.9 F (36.6 C) (10/11 0806) BP: 108/65 (10/11 0806) Pulse Rate: 87 (10/11 0806)  Labs: Recent Labs    10/12/22 0356 10/13/22 0704 10/14/22 0425  HGB 8.1*  --  8.0*  HCT 26.9*  --  25.6*  PLT 309  --  286  CREATININE 0.57 0.94 0.78    Estimated Creatinine Clearance: 64.1 mL/min (by C-G formula based on SCr of 0.78 mg/dL).   Medical History: Past Medical History:  Diagnosis Date   Acute massive pulmonary embolism (HCC) 07/13/2012   Massive PE w/ PEA arrest 07/13/12 >TNK >IVC filter >discharged on comadin     Anticoagulated on warfarin    Chronic diastolic CHF (congestive heart failure) (HCC) 03/15/2017   Chronic kidney disease, stage 3a (HCC) 12/15/2018   Diabetes mellitus, type 2 (HCC) 07/15/2012   with peripheral neuropathy   DM2 (diabetes mellitus, type 2) (HCC) 07/23/2012   HFrEF (heart failure with reduced ejection fraction) (HCC) 03/15/2017   Non-ischemic cardiomyopathy // Cardiac catheterization 01/01/21:  RCA prox to mid 20 // Echocardiogram 12/26/20:  EF 30-35, ant septum/apex, mid inf-sept AK; ant-lat/ant/inf HK, Gr 1 DD, mild LVH, normal RVSF, RVSP 27.2, small effusion (ant to RV), mild MR, mod TR, AV sclerosis w/o AS   Hyperlipemia 07/14/2012   Hyperlipidemia 02/25/2013   Hypertension 07/14/2012   Hypothyroid 06/06/2013   Large bowel stricture (HCC)    s/p colectomy in 2016   Neuropathy 02/12/2015   Osteoarthritis    s/p hip and knee replacements   PAF (paroxysmal atrial  fibrillation) (HCC)    Pulmonary hypertension (HCC) 07/26/2016   Syncope 10/2018   Thyroid disease     Assessment: Hannah [name] is a 82 y.o. female presenting with hemorrhagic shock. Imaging reveled DVT of right upper extremity and left subclavian vein. PMH significant for pulmonary HTN, CKD, afib, and chronic diastolic CHF. Pharmacy has been consulted to initiate patient on a heparin infusion. No anticoagulation PTA and patient has only been on prophylaxis dose of enoxaparin inpatient (last dose 10/11 @ 0845).  Per conversation with MD, will bolus patient even though she had an initial presentation of acute blood loss anemia.  Goal of Therapy:  Heparin level 0.3-0.7 units/ml Monitor platelets by anticoagulation protocol: Yes   Plan:  Give 4900 units bolus x 1 Start heparin infusion at 1350 units/hr Check anti-Xa level in 8 hours and daily while on heparin Continue to monitor H&H and platelets  Merryl Hacker, PharmD Clinical Pharmacist 10/14/2022,12:05 PM

## 2022-10-15 DIAGNOSIS — D62 Acute posthemorrhagic anemia: Secondary | ICD-10-CM | POA: Diagnosis not present

## 2022-10-15 DIAGNOSIS — A419 Sepsis, unspecified organism: Secondary | ICD-10-CM | POA: Diagnosis not present

## 2022-10-15 DIAGNOSIS — R578 Other shock: Secondary | ICD-10-CM | POA: Diagnosis not present

## 2022-10-15 DIAGNOSIS — S13101A Dislocation of unspecified cervical vertebrae, initial encounter: Secondary | ICD-10-CM | POA: Diagnosis not present

## 2022-10-15 LAB — CBC
HCT: 24.3 % — ABNORMAL LOW (ref 36.0–46.0)
Hemoglobin: 7.4 g/dL — ABNORMAL LOW (ref 12.0–15.0)
MCH: 28.1 pg (ref 26.0–34.0)
MCHC: 30.5 g/dL (ref 30.0–36.0)
MCV: 92.4 fL (ref 80.0–100.0)
Platelets: 257 10*3/uL (ref 150–400)
RBC: 2.63 MIL/uL — ABNORMAL LOW (ref 3.87–5.11)
RDW: 21.1 % — ABNORMAL HIGH (ref 11.5–15.5)
WBC: 15 10*3/uL — ABNORMAL HIGH (ref 4.0–10.5)
nRBC: 0.4 % — ABNORMAL HIGH (ref 0.0–0.2)

## 2022-10-15 LAB — BASIC METABOLIC PANEL
Anion gap: 6 (ref 5–15)
BUN: 72 mg/dL — ABNORMAL HIGH (ref 8–23)
CO2: 28 mmol/L (ref 22–32)
Calcium: 8 mg/dL — ABNORMAL LOW (ref 8.9–10.3)
Chloride: 99 mmol/L (ref 98–111)
Creatinine, Ser: 0.67 mg/dL (ref 0.44–1.00)
GFR, Estimated: >60 mL/min (ref >60)
Glucose, Bld: 149 mg/dL — ABNORMAL HIGH (ref 70–99)
Potassium: 5.9 mmol/L — ABNORMAL HIGH (ref 3.5–5.1)
Sodium: 133 mmol/L — ABNORMAL LOW (ref 135–145)

## 2022-10-15 LAB — HEPARIN LEVEL (UNFRACTIONATED): Heparin Unfractionated: >1.1 [IU]/mL — ABNORMAL HIGH (ref 0.30–0.70)

## 2022-10-15 LAB — PHOSPHORUS: Phosphorus: 3.4 mg/dL (ref 2.5–4.6)

## 2022-10-15 LAB — HEMOGLOBIN AND HEMATOCRIT, BLOOD
HCT: 21.4 % — ABNORMAL LOW (ref 36.0–46.0)
Hemoglobin: 6.4 g/dL — ABNORMAL LOW (ref 12.0–15.0)

## 2022-10-15 LAB — GLUCOSE, CAPILLARY
Glucose-Capillary: 102 mg/dL — ABNORMAL HIGH (ref 70–99)
Glucose-Capillary: 126 mg/dL — ABNORMAL HIGH (ref 70–99)
Glucose-Capillary: 189 mg/dL — ABNORMAL HIGH (ref 70–99)
Glucose-Capillary: 254 mg/dL — ABNORMAL HIGH (ref 70–99)

## 2022-10-15 LAB — MAGNESIUM: Magnesium: 2.2 mg/dL (ref 1.7–2.4)

## 2022-10-15 MED ORDER — HEPARIN (PORCINE) 25000 UT/250ML-% IV SOLN: 950.0000 [IU]/h | INTRAVENOUS | Status: DC

## 2022-10-15 MED ORDER — SODIUM ZIRCONIUM CYCLOSILICATE 10 G PO PACK
10.0000 g | PACK | Freq: Three times a day (TID) | ORAL | Status: DC
  Administered 2022-10-15: 10 g via ORAL
  Filled 2022-10-15 (×2): qty 1

## 2022-10-15 MED ORDER — SODIUM ZIRCONIUM CYCLOSILICATE 10 G PO PACK: 10.0000 g | PACK | Freq: Three times a day (TID) | ORAL | Status: DC

## 2022-10-15 MED ORDER — SODIUM CHLORIDE 0.9% IV SOLUTION: Freq: Once | INTRAVENOUS | Status: DC

## 2022-10-16 LAB — PREPARE RBC (CROSSMATCH)

## 2022-10-24 ENCOUNTER — Encounter: Payer: Medicare Other | Admitting: Licensed Clinical Social Worker

## 2022-11-04 NOTE — Plan of Care (Signed)
  Problem: Coping: Goal: Will verbalize positive feelings about self Outcome: Progressing   Problem: Nutrition: Goal: Risk of aspiration will decrease Outcome: Progressing   Problem: Activity: Goal: Risk for activity intolerance will decrease Outcome: Progressing   Problem: Coping: Goal: Level of anxiety will decrease Outcome: Progressing   Problem: Pain Managment: Goal: General experience of comfort will improve Outcome: Progressing   Problem: Safety: Goal: Ability to remain free from injury will improve Outcome: Progressing   Problem: Coping: Goal: Ability to adjust to condition or change in health will improve Outcome: Progressing   Problem: Activity: Goal: Ability to return to baseline activity level will improve Outcome: Not Progressing; dont engage   Problem: Self-Care: Goal: Ability to participate in self-care as condition permits will improve Outcome: Not Progressing   Problem: Activity: Goal: Ability to return to baseline activity level will improve Outcome: Not Progressing   Problem: Self-Care: Goal: Ability to participate in self-care as condition permits will improve Outcome: Not Progressing   Problem: Activity: Goal: Ability to return to baseline activity level will improve Outcome: Not Progressing   Problem: Self-Care: Goal: Ability to participate in self-care as condition permits will improve Outcome: Not Progressing unable to assist   Problem: Activity: Goal: Ability to return to baseline activity level will improve Outcome: Not Progressing   Problem: Self-Care: Goal: Ability to participate in self-care as condition permits will improve Outcome: Not Progressing   Problem: Activity: Goal: Ability to return to baseline activity level will improve Outcome: Not Progressing   Problem: Self-Care: Goal: Ability to participate in self-care as condition permits will improve Outcome: Not Progressing

## 2022-11-04 NOTE — Progress Notes (Signed)
[facility] Discharge Summary   Death summary   Patient: Hannah [name] ZOX:[id number]  PCP: [name], MD  Date of admission: 09/18/2022   Date of discharge: October 27, 2022     Discharge Diagnoses:  Principal Problem:   Hemorrhagic shock (HCC) Active Problems:   Pulmonary hypertension (HCC)   CKD stage 3a, GFR 45-59 ml/min (HCC)   CAD (coronary artery disease)   Paroxysmal atrial fibrillation (HCC)   Chronic diastolic CHF (congestive heart failure) (HCC)   Closed dislocation of cervical vertebra   Pressure injury of skin   AKI (acute kidney injury) (HCC)   Acute blood loss anemia   Hyperkalemia   Hyponatremia   Pleural effusion on right   Admitted From: SNF Disposition: Deceased  Date of death 10-27-2022 Time of Death: 1514/11/03 Cause of death: C1/C2 cervical fracture  Code Status: Limited code DNR/DNI  History of present illness: As per the H and P dictated on admission Hospital Course:  82 y.o. female with past medical history significant for recent C1/C2 fracture and central cord syndrome approximately 1 month ago following a fall at home status post laminectomy at [facility], PE, HFpEF, CKD Stage IIIa, DM Type 2, HTN, paroxsymal A.fib, Hypothyroidism. Of note, she was brought to the emergency department on 09/28/2022 because of bleeding from a PICC line on her right arm. She was brought from the nursing home to the emergency department because of hemoglobin of 6.8 on 09/26/2022.    Of note, details of recent fracture are as follows: She had a fall on 8/21 with associated neck pain and bilateral weakness. She was admitted at [facility] where CT showed C1-C1 fracture and a small subarachnoid hemorrhage. MRI obtained showed C1 and C2 fracture, ossification of posterior longitudinal ligament, cervical spondylosis, cervical spinal cord injury, central cord syndrome.  Initial plan by neuro surgery at [facility] was for posterior cervical decompression, instrumentation and fusion from  C1-C6 to be done on 8/28, but it was felt she would be best served by medically stabilizing and correcting coagulopathy prior surgery.  However on 8/25 family requested she be transferred to [facility] for Neurosurgical intervention there.   She was admitted to the ICU for hypovolemic/hemorrhagic/septic shock and severe anemia.  She was treated with IV fluids, empiric IV antibiotics and IV vasopressors.  She was also transfused with 2 units of packed red blood cells.  Workup also showed that she had new C1 and C2 dislocation on top of recent C1 and C2 fractures with resultant severe spinal stenosis at C1-C2.  Dr. Katrinka Blazing, neurosurgeon, was consulted to assist with management.  He discussed the case with neurosurgeon at [facility] who recommended nonsurgical approach.   10/8: Patient was previously on full comfort measures however this has been rescinded by the family.  We are attempting to medically optimized and place patient in skilled nursing facility.  As of this date we do not have an accepting facility.   2022-10-27 discussed with patient's daughter [name] again today and she discussed with her siblings and would like to move move forward with comfort measures today.  Palliative care and social worker will be involved, patient may need hospice placement  Palliative care was reengaged, after discussion with family patient was transition to comfort measures only. Patient passed away around 1514-11-03, death was pronounced by palliative care nurse practitioner.  Assessment and Plan: Hypovolemic/hemorrhagic/septic shock.  Resolved   Autonomic dysfunction in the setting of recent C1 and C2 spine fracture s/p laminectomy at [facility], now  from C3-C6 with plate and screws in place. Soft tissues and spinal canal: No significant prevertebral fluid. Postoperative changes in the posterior neck soft tissues including fluid collections from C1-C6, incompletely evaluated in the absence of IV contrast. Disc levels: Severe spinal stenosis at C1-2 related to the fractures and posterior  subluxation of C1 relative to C2 as well as ligamentous thickening and calcification posterior to the dens. 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 Upper chest: No mass or consolidation in the included lung apices. Other: None. IMPRESSION: 1. Known recent C1 and C2 fractures with increased fracture displacement and angulation including new complete posterior dislocation of the lateral masses of C1 relative to C2. Resultant severe spinal stenosis at C1-2. 2. No evidence of acute intracranial abnormality. 3. Moderate chronic small vessel ischemic disease and advanced cerebral atrophy. 4. New bilateral mastoid and middle ear effusions. Electronically Signed   By: Sebastian Ache M.D.   On: 09/22/2022 11:26   DG Chest Port 1 View  Result Date: 09/20/2022 CLINICAL DATA:  82 year old female with shortness of breath. Atrial fibrillation. Altered mental status. Unexplained anemia. EXAM: PORTABLE CHEST 1 VIEW COMPARISON:  Portable chest 08/24/2022. FINDINGS: Portable AP view at 0945 hours. Lower lung volumes. Stable cardiomegaly, aortic tortuosity and calcification, mediastinal contours. Visualized tracheal air column is within normal limits. Allowing for portable technique the lungs are clear. In the visible abdomen some oral contrast mixed with retained stool is suspected. Nonobstructed visible bowel gas pattern. No acute osseous abnormality identified. IMPRESSION: 1. Low lung volumes, otherwise no acute cardiopulmonary abnormality. Aortic Atherosclerosis (ICD10-I70.0). 2. Possible oral contrast mixed with stool in visible bowel. Electronically Signed   By: Odessa Fleming M.D.   On: 09/23/2022 09:55    Microbiology: No results found for this or any previous visit (from the past 240 hour(s)).   Labs: CBC: Recent Labs  Lab 10/12/22 0356 10/14/22 0425 10/17/2022 0515 10/11/2022 0841  WBC 15.0* 16.1* 15.0*  --   NEUTROABS 12.6*  --   --   --   HGB  8.1* 8.0* 7.4* 6.4*  HCT 26.9* 25.6* 24.3* 21.4*  MCV 93.4 91.8 92.4  --   PLT 309 286 257  --    Basic Metabolic Panel: Recent Labs  Lab 10/10/22 0519 10/11/22 0831 10/12/22 0356 10/12/22 0358 10/13/22 0704 10/14/22 0425 10/05/2022 0515  NA  --  139 138  --  135 136 133*  K  --  4.9 4.5  --  5.2* 5.2* 5.9*  CL  --  109 105  --  99 100 99  CO2  --  27 28  --  27 28 28   GLUCOSE  --  220* 154*  --  149* 118* 149*  BUN  --  26* 30*  --  49* 63* 72*  CREATININE  --  0.52 0.57  --  0.94 0.78 0.67  CALCIUM  --  8.2* 8.4*  --  8.2* 8.5* 8.0*  MG 1.9 1.8  --  1.8 2.1 2.1 2.2  PHOS 2.5  --   --  1.5* 4.1 3.5 3.4   Liver Function Tests: No results for input(s): "AST", "ALT", "ALKPHOS", "BILITOT", "PROT", "ALBUMIN" in the last 168 hours. No results for input(s): "LIPASE", "AMYLASE" in the last 168 hours. No results for input(s): "AMMONIA" in the last 168 hours. Cardiac Enzymes: No results for input(s): "CKTOTAL", "CKMB", "CKMBINDEX", "TROPONINI" in the last 168 hours. BNP (last 3 results) Recent Labs    11/23/21 2059  from C3-C6 with plate and screws in place. Soft tissues and spinal canal: No significant prevertebral fluid. Postoperative changes in the posterior neck soft tissues including fluid collections from C1-C6, incompletely evaluated in the absence of IV contrast. Disc levels: Severe spinal stenosis at C1-2 related to the fractures and posterior  subluxation of C1 relative to C2 as well as ligamentous thickening and calcification posterior to the dens. 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 Upper chest: No mass or consolidation in the included lung apices. Other: None. IMPRESSION: 1. Known recent C1 and C2 fractures with increased fracture displacement and angulation including new complete posterior dislocation of the lateral masses of C1 relative to C2. Resultant severe spinal stenosis at C1-2. 2. No evidence of acute intracranial abnormality. 3. Moderate chronic small vessel ischemic disease and advanced cerebral atrophy. 4. New bilateral mastoid and middle ear effusions. Electronically Signed   By: Sebastian Ache M.D.   On: 09/22/2022 11:26   DG Chest Port 1 View  Result Date: 09/20/2022 CLINICAL DATA:  82 year old female with shortness of breath. Atrial fibrillation. Altered mental status. Unexplained anemia. EXAM: PORTABLE CHEST 1 VIEW COMPARISON:  Portable chest 08/24/2022. FINDINGS: Portable AP view at 0945 hours. Lower lung volumes. Stable cardiomegaly, aortic tortuosity and calcification, mediastinal contours. Visualized tracheal air column is within normal limits. Allowing for portable technique the lungs are clear. In the visible abdomen some oral contrast mixed with retained stool is suspected. Nonobstructed visible bowel gas pattern. No acute osseous abnormality identified. IMPRESSION: 1. Low lung volumes, otherwise no acute cardiopulmonary abnormality. Aortic Atherosclerosis (ICD10-I70.0). 2. Possible oral contrast mixed with stool in visible bowel. Electronically Signed   By: Odessa Fleming M.D.   On: 09/23/2022 09:55    Microbiology: No results found for this or any previous visit (from the past 240 hour(s)).   Labs: CBC: Recent Labs  Lab 10/12/22 0356 10/14/22 0425 10/17/2022 0515 10/11/2022 0841  WBC 15.0* 16.1* 15.0*  --   NEUTROABS 12.6*  --   --   --   HGB  8.1* 8.0* 7.4* 6.4*  HCT 26.9* 25.6* 24.3* 21.4*  MCV 93.4 91.8 92.4  --   PLT 309 286 257  --    Basic Metabolic Panel: Recent Labs  Lab 10/10/22 0519 10/11/22 0831 10/12/22 0356 10/12/22 0358 10/13/22 0704 10/14/22 0425 10/05/2022 0515  NA  --  139 138  --  135 136 133*  K  --  4.9 4.5  --  5.2* 5.2* 5.9*  CL  --  109 105  --  99 100 99  CO2  --  27 28  --  27 28 28   GLUCOSE  --  220* 154*  --  149* 118* 149*  BUN  --  26* 30*  --  49* 63* 72*  CREATININE  --  0.52 0.57  --  0.94 0.78 0.67  CALCIUM  --  8.2* 8.4*  --  8.2* 8.5* 8.0*  MG 1.9 1.8  --  1.8 2.1 2.1 2.2  PHOS 2.5  --   --  1.5* 4.1 3.5 3.4   Liver Function Tests: No results for input(s): "AST", "ALT", "ALKPHOS", "BILITOT", "PROT", "ALBUMIN" in the last 168 hours. No results for input(s): "LIPASE", "AMYLASE" in the last 168 hours. No results for input(s): "AMMONIA" in the last 168 hours. Cardiac Enzymes: No results for input(s): "CKTOTAL", "CKMB", "CKMBINDEX", "TROPONINI" in the last 168 hours. BNP (last 3 results) Recent Labs    11/23/21 2059  from C3-C6 with plate and screws in place. Soft tissues and spinal canal: No significant prevertebral fluid. Postoperative changes in the posterior neck soft tissues including fluid collections from C1-C6, incompletely evaluated in the absence of IV contrast. Disc levels: Severe spinal stenosis at C1-2 related to the fractures and posterior  subluxation of C1 relative to C2 as well as ligamentous thickening and calcification posterior to the dens. 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 Upper chest: No mass or consolidation in the included lung apices. Other: None. IMPRESSION: 1. Known recent C1 and C2 fractures with increased fracture displacement and angulation including new complete posterior dislocation of the lateral masses of C1 relative to C2. Resultant severe spinal stenosis at C1-2. 2. No evidence of acute intracranial abnormality. 3. Moderate chronic small vessel ischemic disease and advanced cerebral atrophy. 4. New bilateral mastoid and middle ear effusions. Electronically Signed   By: Sebastian Ache M.D.   On: 09/22/2022 11:26   DG Chest Port 1 View  Result Date: 09/20/2022 CLINICAL DATA:  82 year old female with shortness of breath. Atrial fibrillation. Altered mental status. Unexplained anemia. EXAM: PORTABLE CHEST 1 VIEW COMPARISON:  Portable chest 08/24/2022. FINDINGS: Portable AP view at 0945 hours. Lower lung volumes. Stable cardiomegaly, aortic tortuosity and calcification, mediastinal contours. Visualized tracheal air column is within normal limits. Allowing for portable technique the lungs are clear. In the visible abdomen some oral contrast mixed with retained stool is suspected. Nonobstructed visible bowel gas pattern. No acute osseous abnormality identified. IMPRESSION: 1. Low lung volumes, otherwise no acute cardiopulmonary abnormality. Aortic Atherosclerosis (ICD10-I70.0). 2. Possible oral contrast mixed with stool in visible bowel. Electronically Signed   By: Odessa Fleming M.D.   On: 09/23/2022 09:55    Microbiology: No results found for this or any previous visit (from the past 240 hour(s)).   Labs: CBC: Recent Labs  Lab 10/12/22 0356 10/14/22 0425 10/17/2022 0515 10/11/2022 0841  WBC 15.0* 16.1* 15.0*  --   NEUTROABS 12.6*  --   --   --   HGB  8.1* 8.0* 7.4* 6.4*  HCT 26.9* 25.6* 24.3* 21.4*  MCV 93.4 91.8 92.4  --   PLT 309 286 257  --    Basic Metabolic Panel: Recent Labs  Lab 10/10/22 0519 10/11/22 0831 10/12/22 0356 10/12/22 0358 10/13/22 0704 10/14/22 0425 10/05/2022 0515  NA  --  139 138  --  135 136 133*  K  --  4.9 4.5  --  5.2* 5.2* 5.9*  CL  --  109 105  --  99 100 99  CO2  --  27 28  --  27 28 28   GLUCOSE  --  220* 154*  --  149* 118* 149*  BUN  --  26* 30*  --  49* 63* 72*  CREATININE  --  0.52 0.57  --  0.94 0.78 0.67  CALCIUM  --  8.2* 8.4*  --  8.2* 8.5* 8.0*  MG 1.9 1.8  --  1.8 2.1 2.1 2.2  PHOS 2.5  --   --  1.5* 4.1 3.5 3.4   Liver Function Tests: No results for input(s): "AST", "ALT", "ALKPHOS", "BILITOT", "PROT", "ALBUMIN" in the last 168 hours. No results for input(s): "LIPASE", "AMYLASE" in the last 168 hours. No results for input(s): "AMMONIA" in the last 168 hours. Cardiac Enzymes: No results for input(s): "CKTOTAL", "CKMB", "CKMBINDEX", "TROPONINI" in the last 168 hours. BNP (last 3 results) Recent Labs    11/23/21 2059  [facility] Discharge Summary   Death summary   Patient: Hannah [name] ZOX:[id number]  PCP: [name], MD  Date of admission: 09/18/2022   Date of discharge: October 27, 2022     Discharge Diagnoses:  Principal Problem:   Hemorrhagic shock (HCC) Active Problems:   Pulmonary hypertension (HCC)   CKD stage 3a, GFR 45-59 ml/min (HCC)   CAD (coronary artery disease)   Paroxysmal atrial fibrillation (HCC)   Chronic diastolic CHF (congestive heart failure) (HCC)   Closed dislocation of cervical vertebra   Pressure injury of skin   AKI (acute kidney injury) (HCC)   Acute blood loss anemia   Hyperkalemia   Hyponatremia   Pleural effusion on right   Admitted From: SNF Disposition: Deceased  Date of death 10-27-2022 Time of Death: 1514/11/03 Cause of death: C1/C2 cervical fracture  Code Status: Limited code DNR/DNI  History of present illness: As per the H and P dictated on admission Hospital Course:  82 y.o. female with past medical history significant for recent C1/C2 fracture and central cord syndrome approximately 1 month ago following a fall at home status post laminectomy at [facility], PE, HFpEF, CKD Stage IIIa, DM Type 2, HTN, paroxsymal A.fib, Hypothyroidism. Of note, she was brought to the emergency department on 09/28/2022 because of bleeding from a PICC line on her right arm. She was brought from the nursing home to the emergency department because of hemoglobin of 6.8 on 09/26/2022.    Of note, details of recent fracture are as follows: She had a fall on 8/21 with associated neck pain and bilateral weakness. She was admitted at [facility] where CT showed C1-C1 fracture and a small subarachnoid hemorrhage. MRI obtained showed C1 and C2 fracture, ossification of posterior longitudinal ligament, cervical spondylosis, cervical spinal cord injury, central cord syndrome.  Initial plan by neuro surgery at [facility] was for posterior cervical decompression, instrumentation and fusion from  C1-C6 to be done on 8/28, but it was felt she would be best served by medically stabilizing and correcting coagulopathy prior surgery.  However on 8/25 family requested she be transferred to [facility] for Neurosurgical intervention there.   She was admitted to the ICU for hypovolemic/hemorrhagic/septic shock and severe anemia.  She was treated with IV fluids, empiric IV antibiotics and IV vasopressors.  She was also transfused with 2 units of packed red blood cells.  Workup also showed that she had new C1 and C2 dislocation on top of recent C1 and C2 fractures with resultant severe spinal stenosis at C1-C2.  Dr. Katrinka Blazing, neurosurgeon, was consulted to assist with management.  He discussed the case with neurosurgeon at [facility] who recommended nonsurgical approach.   10/8: Patient was previously on full comfort measures however this has been rescinded by the family.  We are attempting to medically optimized and place patient in skilled nursing facility.  As of this date we do not have an accepting facility.   2022-10-27 discussed with patient's daughter [name] again today and she discussed with her siblings and would like to move move forward with comfort measures today.  Palliative care and social worker will be involved, patient may need hospice placement  Palliative care was reengaged, after discussion with family patient was transition to comfort measures only. Patient passed away around 1514-11-03, death was pronounced by palliative care nurse practitioner.  Assessment and Plan: Hypovolemic/hemorrhagic/septic shock.  Resolved   Autonomic dysfunction in the setting of recent C1 and C2 spine fracture s/p laminectomy at [facility], now  [facility] Discharge Summary   Death summary   Patient: Hannah [name] ZOX:[id number]  PCP: [name], MD  Date of admission: 09/18/2022   Date of discharge: October 27, 2022     Discharge Diagnoses:  Principal Problem:   Hemorrhagic shock (HCC) Active Problems:   Pulmonary hypertension (HCC)   CKD stage 3a, GFR 45-59 ml/min (HCC)   CAD (coronary artery disease)   Paroxysmal atrial fibrillation (HCC)   Chronic diastolic CHF (congestive heart failure) (HCC)   Closed dislocation of cervical vertebra   Pressure injury of skin   AKI (acute kidney injury) (HCC)   Acute blood loss anemia   Hyperkalemia   Hyponatremia   Pleural effusion on right   Admitted From: SNF Disposition: Deceased  Date of death 10-27-2022 Time of Death: 1514/11/03 Cause of death: C1/C2 cervical fracture  Code Status: Limited code DNR/DNI  History of present illness: As per the H and P dictated on admission Hospital Course:  82 y.o. female with past medical history significant for recent C1/C2 fracture and central cord syndrome approximately 1 month ago following a fall at home status post laminectomy at [facility], PE, HFpEF, CKD Stage IIIa, DM Type 2, HTN, paroxsymal A.fib, Hypothyroidism. Of note, she was brought to the emergency department on 09/28/2022 because of bleeding from a PICC line on her right arm. She was brought from the nursing home to the emergency department because of hemoglobin of 6.8 on 09/26/2022.    Of note, details of recent fracture are as follows: She had a fall on 8/21 with associated neck pain and bilateral weakness. She was admitted at [facility] where CT showed C1-C1 fracture and a small subarachnoid hemorrhage. MRI obtained showed C1 and C2 fracture, ossification of posterior longitudinal ligament, cervical spondylosis, cervical spinal cord injury, central cord syndrome.  Initial plan by neuro surgery at [facility] was for posterior cervical decompression, instrumentation and fusion from  C1-C6 to be done on 8/28, but it was felt she would be best served by medically stabilizing and correcting coagulopathy prior surgery.  However on 8/25 family requested she be transferred to [facility] for Neurosurgical intervention there.   She was admitted to the ICU for hypovolemic/hemorrhagic/septic shock and severe anemia.  She was treated with IV fluids, empiric IV antibiotics and IV vasopressors.  She was also transfused with 2 units of packed red blood cells.  Workup also showed that she had new C1 and C2 dislocation on top of recent C1 and C2 fractures with resultant severe spinal stenosis at C1-C2.  Dr. Katrinka Blazing, neurosurgeon, was consulted to assist with management.  He discussed the case with neurosurgeon at [facility] who recommended nonsurgical approach.   10/8: Patient was previously on full comfort measures however this has been rescinded by the family.  We are attempting to medically optimized and place patient in skilled nursing facility.  As of this date we do not have an accepting facility.   2022-10-27 discussed with patient's daughter [name] again today and she discussed with her siblings and would like to move move forward with comfort measures today.  Palliative care and social worker will be involved, patient may need hospice placement  Palliative care was reengaged, after discussion with family patient was transition to comfort measures only. Patient passed away around 1514-11-03, death was pronounced by palliative care nurse practitioner.  Assessment and Plan: Hypovolemic/hemorrhagic/septic shock.  Resolved   Autonomic dysfunction in the setting of recent C1 and C2 spine fracture s/p laminectomy at [facility], now  [facility] Discharge Summary   Death summary   Patient: Hannah [name] ZOX:[id number]  PCP: [name], MD  Date of admission: 09/18/2022   Date of discharge: October 27, 2022     Discharge Diagnoses:  Principal Problem:   Hemorrhagic shock (HCC) Active Problems:   Pulmonary hypertension (HCC)   CKD stage 3a, GFR 45-59 ml/min (HCC)   CAD (coronary artery disease)   Paroxysmal atrial fibrillation (HCC)   Chronic diastolic CHF (congestive heart failure) (HCC)   Closed dislocation of cervical vertebra   Pressure injury of skin   AKI (acute kidney injury) (HCC)   Acute blood loss anemia   Hyperkalemia   Hyponatremia   Pleural effusion on right   Admitted From: SNF Disposition: Deceased  Date of death 10-27-2022 Time of Death: 1514/11/03 Cause of death: C1/C2 cervical fracture  Code Status: Limited code DNR/DNI  History of present illness: As per the H and P dictated on admission Hospital Course:  82 y.o. female with past medical history significant for recent C1/C2 fracture and central cord syndrome approximately 1 month ago following a fall at home status post laminectomy at [facility], PE, HFpEF, CKD Stage IIIa, DM Type 2, HTN, paroxsymal A.fib, Hypothyroidism. Of note, she was brought to the emergency department on 09/28/2022 because of bleeding from a PICC line on her right arm. She was brought from the nursing home to the emergency department because of hemoglobin of 6.8 on 09/26/2022.    Of note, details of recent fracture are as follows: She had a fall on 8/21 with associated neck pain and bilateral weakness. She was admitted at [facility] where CT showed C1-C1 fracture and a small subarachnoid hemorrhage. MRI obtained showed C1 and C2 fracture, ossification of posterior longitudinal ligament, cervical spondylosis, cervical spinal cord injury, central cord syndrome.  Initial plan by neuro surgery at [facility] was for posterior cervical decompression, instrumentation and fusion from  C1-C6 to be done on 8/28, but it was felt she would be best served by medically stabilizing and correcting coagulopathy prior surgery.  However on 8/25 family requested she be transferred to [facility] for Neurosurgical intervention there.   She was admitted to the ICU for hypovolemic/hemorrhagic/septic shock and severe anemia.  She was treated with IV fluids, empiric IV antibiotics and IV vasopressors.  She was also transfused with 2 units of packed red blood cells.  Workup also showed that she had new C1 and C2 dislocation on top of recent C1 and C2 fractures with resultant severe spinal stenosis at C1-C2.  Dr. Katrinka Blazing, neurosurgeon, was consulted to assist with management.  He discussed the case with neurosurgeon at [facility] who recommended nonsurgical approach.   10/8: Patient was previously on full comfort measures however this has been rescinded by the family.  We are attempting to medically optimized and place patient in skilled nursing facility.  As of this date we do not have an accepting facility.   2022-10-27 discussed with patient's daughter [name] again today and she discussed with her siblings and would like to move move forward with comfort measures today.  Palliative care and social worker will be involved, patient may need hospice placement  Palliative care was reengaged, after discussion with family patient was transition to comfort measures only. Patient passed away around 1514-11-03, death was pronounced by palliative care nurse practitioner.  Assessment and Plan: Hypovolemic/hemorrhagic/septic shock.  Resolved   Autonomic dysfunction in the setting of recent C1 and C2 spine fracture s/p laminectomy at [facility], now  from C3-C6 with plate and screws in place. Soft tissues and spinal canal: No significant prevertebral fluid. Postoperative changes in the posterior neck soft tissues including fluid collections from C1-C6, incompletely evaluated in the absence of IV contrast. Disc levels: Severe spinal stenosis at C1-2 related to the fractures and posterior  subluxation of C1 relative to C2 as well as ligamentous thickening and calcification posterior to the dens. 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 Upper chest: No mass or consolidation in the included lung apices. Other: None. IMPRESSION: 1. Known recent C1 and C2 fractures with increased fracture displacement and angulation including new complete posterior dislocation of the lateral masses of C1 relative to C2. Resultant severe spinal stenosis at C1-2. 2. No evidence of acute intracranial abnormality. 3. Moderate chronic small vessel ischemic disease and advanced cerebral atrophy. 4. New bilateral mastoid and middle ear effusions. Electronically Signed   By: Sebastian Ache M.D.   On: 09/22/2022 11:26   DG Chest Port 1 View  Result Date: 09/20/2022 CLINICAL DATA:  82 year old female with shortness of breath. Atrial fibrillation. Altered mental status. Unexplained anemia. EXAM: PORTABLE CHEST 1 VIEW COMPARISON:  Portable chest 08/24/2022. FINDINGS: Portable AP view at 0945 hours. Lower lung volumes. Stable cardiomegaly, aortic tortuosity and calcification, mediastinal contours. Visualized tracheal air column is within normal limits. Allowing for portable technique the lungs are clear. In the visible abdomen some oral contrast mixed with retained stool is suspected. Nonobstructed visible bowel gas pattern. No acute osseous abnormality identified. IMPRESSION: 1. Low lung volumes, otherwise no acute cardiopulmonary abnormality. Aortic Atherosclerosis (ICD10-I70.0). 2. Possible oral contrast mixed with stool in visible bowel. Electronically Signed   By: Odessa Fleming M.D.   On: 09/23/2022 09:55    Microbiology: No results found for this or any previous visit (from the past 240 hour(s)).   Labs: CBC: Recent Labs  Lab 10/12/22 0356 10/14/22 0425 10/17/2022 0515 10/11/2022 0841  WBC 15.0* 16.1* 15.0*  --   NEUTROABS 12.6*  --   --   --   HGB  8.1* 8.0* 7.4* 6.4*  HCT 26.9* 25.6* 24.3* 21.4*  MCV 93.4 91.8 92.4  --   PLT 309 286 257  --    Basic Metabolic Panel: Recent Labs  Lab 10/10/22 0519 10/11/22 0831 10/12/22 0356 10/12/22 0358 10/13/22 0704 10/14/22 0425 10/05/2022 0515  NA  --  139 138  --  135 136 133*  K  --  4.9 4.5  --  5.2* 5.2* 5.9*  CL  --  109 105  --  99 100 99  CO2  --  27 28  --  27 28 28   GLUCOSE  --  220* 154*  --  149* 118* 149*  BUN  --  26* 30*  --  49* 63* 72*  CREATININE  --  0.52 0.57  --  0.94 0.78 0.67  CALCIUM  --  8.2* 8.4*  --  8.2* 8.5* 8.0*  MG 1.9 1.8  --  1.8 2.1 2.1 2.2  PHOS 2.5  --   --  1.5* 4.1 3.5 3.4   Liver Function Tests: No results for input(s): "AST", "ALT", "ALKPHOS", "BILITOT", "PROT", "ALBUMIN" in the last 168 hours. No results for input(s): "LIPASE", "AMYLASE" in the last 168 hours. No results for input(s): "AMMONIA" in the last 168 hours. Cardiac Enzymes: No results for input(s): "CKTOTAL", "CKMB", "CKMBINDEX", "TROPONINI" in the last 168 hours. BNP (last 3 results) Recent Labs    11/23/21 2059

## 2022-11-04 NOTE — Progress Notes (Signed)
Daily Progress Note   Patient Name: Hannah [name]       Date: 10/31/2022 DOB: 09-27-1940  Age: 82 y.o. MRN#: [id number] Attending Physician: Gillis Santa, MD Primary Care Physician: [name], MD Admit Date: 10/19/22  Reason for Consultation/Follow-up: Establishing goals of care  HPI/Brief Hospital Review: 82 y.o. female  with past medical history of PE, HFpEF, CKD stage 3a, T2DM, HTN, paroxsymal atrial fibrillation and hypothyroidism admitted from [facility] on 2022-10-19 with anemia found on routine blood draw (hemoglobin 6.8).   Noted recent month long stay at [facility] s/p laminectomy for C1/C2 fracture with associated central cord syndrome and small subarachnoid hemorrhage after suffering a fall at home.   CT cervical spine 1. Known recent C1 and C2 fractures with increased fracture displacement and angulation including new complete posterior dislocation of the lateral masses of C1 relative to C2. Resultant severe spinal stenosis at C1-2.   CT abdomen/pelvis 1. Enlargement and heterogeneity of the right iliopsoas muscles with associated fat stranding is suspicious for an intramuscular hematoma. 2. Small to moderate right pleural effusion with associated atelectasis. 3. Anasarca.   Palliative medicine was consulted for assisting with goals of care conversations.   Goals of care initially for full comfort measures but have since been reversed by family with goal for SNF.  Development of RUE DVT found 10/11-Heparin infusion initiated Noted drop in hemoglobin since initiation of Heparin from 8.0 to 6.4--high suspicion for internal bleed  Notified and requested by primary team 10/12 to meet with family to discuss comfort care/hospice care due to drop in  hemoglobin and declining status.  Subjective: Extensive chart review has been completed prior to meeting patient including labs, vital signs, imaging, progress notes, orders, and available advanced directive documents from current and previous encounters.    Visited with Hannah [name] at her bedside, she is resting in bed with eyes open, nods her head appropriately. Both daughters-Hannah [name] and Hannah [name] present at bedside along with other family members. Per daughters request, GOC conversation help outside of room in chapel.  Both Hannah [name] and Hannah [name] shared their understanding of earlier conversation had with Dr. Lucianne Muss, noted drop in hemoglobin with high suspicion for internal bleeding and continued functional decline.Hannah [name] and Hannah [name] express their interest in pursuing comfort care at this time.  From previous conversations they are  familiar with the process of comfort care but we reviewed that the focus shifts to providing comfort with an understanding Ms. Lancour's time is likely limited. Ms. Leith would no longer receive aggressive medical interventions such as continuous vital signs, lab work, radiology testing, or medications not focused on comfort. All care would focus on how the patient is looking and feeling. This would include management of any symptoms that may cause discomfort, pain, shortness of breath, cough, nausea, agitation, anxiety, and/or secretions etc. Symptoms would be managed with medications and other non-pharmacological interventions such as spiritual support if requested, repositioning, music therapy, or therapeutic listening. Family verbalized understanding and appreciation. Ms. Tursi would no longer received tube feedings but actual PEG tube would remain in place. We also discussed continuing comfort feeds as Ms. Hersch requests with an understanding she is at high risk for aspiration.  Both Hannah [name] and Hannah [name] share they spoke with their brother-Hannah [name] over the phone last night  and shared that over the last few days Ms. Funkhouser has expressed to them that she is "tired" and when reviewing Living Will document she shared with her daughters she would not want "life prolonging measures." All children during their conversation last night per Hannah [name] and Hannah [name] agreed to honor their mother's wishes and are no longer interested in aggressive treatments or interventions. Hannah [name]-son not available in person or by phone at time of meeting due to coaching football.  Returned to bedside with Hannah [name] and Hannah [name].  I completed a MOST form today and the signed original was placed in the chart. Each section of options on the form were reviewed in full detail and any questions were answered as needed. The form was scanned and sent to medical records for it to be uploaded under ACP tab in Epic. A photocopy was also placed in the chart to be scanned into EMR. Copy provided to Hannah [name] and Hannah [name] per their request. The patient outlined their wishes for the following treatment decisions:  Cardiopulmonary Resuscitation: Do Not Attempt Resuscitation (DNR/No CPR)  Medical Interventions: Comfort Measures: Keep clean, warm, and dry. Use medication by any route, positioning, wound care, and other measures to relieve pain and suffering. Use oxygen, suction and manual treatment of airway obstruction as needed for comfort. Do not transfer to the hospital unless comfort needs cannot be met in current location.  Antibiotics: No antibiotics (use other measures to relieve symptoms)  IV Fluids: No IV fluids (provide other measures to ensure comfort)  Feeding Tube: Feeding tube for a defined trial period     While completing MOST form, phlebotomy attempted to obtain type and screen, Hannah [name] and Hannah [name] agreed during conversations had with Dr. Lucianne Muss to proceed with 1 unit PRBC. Type and screen unable to be obtained, next phlebotomist not available to attempt draw until this evening.  Prior to having ability to  update orders to reflect comfort care as agreed upon with family received notification from nursing staff that Ms. Hillegass unresponsive and without a pulse. Returned to bedside as requested by nursing staff and Dr. Lucianne Muss to pronounce death, auscultated for heart beat, time of death 12/08/14. Hannah [name] and Hannah [name] at bedside, notified them of time of passing, both daughters grieving appropriately. Offered Chaplain presence, request to wait until all family arrives-nursing to notify at their request. Emotional support and condolences provided.  Dr. Lucianne Muss notified of time of death, he will be completing death note/summary.  Care plan was discussed with   Thank you for allowing the Palliative Medicine Team to assist  in the care of this patient.  Total time:  80 minutes  Time spent includes: Detailed review of medical records (labs, imaging, vital signs), medically appropriate exam (mental status, respiratory, cardiac, skin), discussed with treatment team, counseling and educating patient, family and staff, documenting clinical information, medication management and coordination of care.  Leeanne Deed, DNP, AGNP-C Palliative Medicine   Please contact Palliative Medicine Team phone at [phone] for questions and concerns.

## 2022-11-04 NOTE — Progress Notes (Signed)
Chaplain provided family prayer support at time of death. Family grieving well. Life review shared and appreciation for mothers' love. Waiting a brother to arrive from [place] to make final arrangements. [facility] phone number given to daughter.    10/20/2022 1600  Spiritual Encounters  Type of Visit Follow up  Care provided to: Pt and family  OnCall Visit Yes

## 2022-11-04 NOTE — Progress Notes (Addendum)
10/12/22 0356 10/12/22 0358 10/13/22 0704 10/14/22 0425 10/11/2022 0515  NA  --  139 138  --  135 136 133*  K  --  4.9 4.5  --  5.2* 5.2* 5.9*  CL  --  109 105  --  99 100 99  CO2  --  27 28  --  27 28 28   GLUCOSE  --  220* 154*  --  149* 118* 149*  BUN  --  26* 30*  --  49* 63* 72*  CREATININE  --  0.52 0.57  --  0.94 0.78 0.67  CALCIUM  --  8.2* 8.4*  --  8.2* 8.5* 8.0*  MG 1.9 1.8  --  1.8 2.1 2.1 2.2  PHOS 2.5  --   --  1.5* 4.1 3.5 3.4    Studies: No results found.  Scheduled Meds:  sodium chloride   Intravenous Once   Chlorhexidine Gluconate Cloth  6 each Topical Daily   DULoxetine  30 mg Oral Daily   ferrous sulfate  325 mg Oral Q breakfast   folic acid  1 mg Oral Daily   free water  100 mL Per Tube Q4H   insulin aspart  0-9 Units Subcutaneous Q4H   midodrine  10 mg Oral TID WC   nutrition supplement (JUVEN)  1 packet Per Tube BID BM   pantoprazole (PROTONIX) IV  40 mg Intravenous Q24H   sodium  chloride flush  3 mL Intravenous Q12H   sodium zirconium cyclosilicate  10 g Oral TID   Continuous Infusions:  feeding supplement (GLUCERNA 1.5 CAL) 55 mL/hr at 10/14/22 1510   PRN Meds: acetaminophen **OR** acetaminophen, antiseptic oral rinse, morphine injection, ondansetron **OR** ondansetron (ZOFRAN) IV, polyethylene glycol, polyvinyl alcohol  Time spent: 55 minutes  Author: Gillis Santa. MD [facility] 10/19/2022 11:22 AM  To reach On-call, see care teams to locate the attending and reach out to them via [email or web address]. If 7PM-7AM, please contact night-coverage If you still have difficulty reaching the attending provider, please page the [facility] (Director on Call) for [facility] on amion for assistance.  [facility] Progress Note  Patient: Hannah [name]    ZOX:[id number]  DOA: 09/28/2022     Date of Service: the patient was seen and examined on 10/24/2022  Chief Complaint  Patient presents with   Abnormal Lab    Pt. To ED via EMS from [facility] for abnormal lab, Hgb of 6.8. unknown if any bleeding. Pt. Is baseline nonverbal, diabetic, CHF. Medic states they think pt. Had routine blood draw.   Brief hospital course: 82 y.o. female with past medical history significant for recent C1/C2 fracture and central cord syndrome approximately 1 month ago following a fall at home status post laminectomy at [facility], PE, HFpEF, CKD Stage IIIa, DM Type 2, HTN, paroxsymal A.fib, Hypothyroidism. Of note, she was brought to the emergency department on 09/28/2022 because of bleeding from a PICC line on her right arm. She was brought from the nursing home to the emergency department because of hemoglobin of 6.8 on 09/20/2022.    Of note, details of recent fracture are as follows: She had a fall on 8/21 with associated neck pain and bilateral weakness. She was admitted at [facility] where CT showed C1-C1 fracture and a small subarachnoid hemorrhage. MRI obtained showed C1 and C2 fracture, ossification of posterior longitudinal ligament, cervical spondylosis, cervical spinal cord injury, central cord syndrome.  Initial plan by neuro surgery at [facility] was for posterior cervical decompression, instrumentation and fusion from C1-C6 to be done on 8/28, but it was felt she would be best served by medically stabilizing and correcting coagulopathy prior surgery.  However on 8/25 family requested she be transferred to [facility] for Neurosurgical intervention there.     She was admitted to the ICU for hypovolemic/hemorrhagic/septic shock and severe anemia.  She was treated with IV fluids, empiric IV antibiotics and IV vasopressors.  She was also transfused with 2 units of packed red blood cells.  Workup also showed that  she had new C1 and C2 dislocation on top of recent C1 and C2 fractures with resultant severe spinal stenosis at C1-C2.  Dr. Katrinka Blazing, neurosurgeon, was consulted to assist with management.  He discussed the case with neurosurgeon at [facility] who recommended nonsurgical approach.   10/8: Patient was previously on full comfort measures however this has been rescinded by the family.  We are attempting to medically optimized and place patient in skilled nursing facility.  As of this date we do not have an accepting facility.  10/12 discussed with patient's daughter [name] again today and she discussed with her siblings and would like to move move forward with comfort measures today.  Palliative care and social worker will be involved, patient may need hospice placement  Assessment and Plan: Hypovolemic/hemorrhagic/septic shock.  Resolved   Autonomic dysfunction in the setting of recent C1 and C2 spine fracture s/p laminectomy at [facility], now with worsening dislocation and significant spinal stenosis Continue cervical collar. 10/9 patient was having discomfort, size needs to be changed.  Discussed with neurosurgery to reeval for cervical collar size. 10/10 cervical collar was changed by neurosurgery today.  DVT right upper extremity and left subclavian vein Right arm swelling, Keep right arm elevated Venous duplex: Nonocclusive deep venous thrombosis is noted in the right subclavian, axillary and brachial veins. Nonocclusive thrombus is also noted in the right basilic vein and left subclavian vein. 10/11 started heparin IV infusion, pharmacy consulted for dosing and PTT monitoring as per protocol Monitor H&H 10/12 low Hb possible bleeding, discontinued heparin IV infusion, transfuse  [facility] Progress Note  Patient: Hannah [name]    ZOX:[id number]  DOA: 09/28/2022     Date of Service: the patient was seen and examined on 10/24/2022  Chief Complaint  Patient presents with   Abnormal Lab    Pt. To ED via EMS from [facility] for abnormal lab, Hgb of 6.8. unknown if any bleeding. Pt. Is baseline nonverbal, diabetic, CHF. Medic states they think pt. Had routine blood draw.   Brief hospital course: 82 y.o. female with past medical history significant for recent C1/C2 fracture and central cord syndrome approximately 1 month ago following a fall at home status post laminectomy at [facility], PE, HFpEF, CKD Stage IIIa, DM Type 2, HTN, paroxsymal A.fib, Hypothyroidism. Of note, she was brought to the emergency department on 09/28/2022 because of bleeding from a PICC line on her right arm. She was brought from the nursing home to the emergency department because of hemoglobin of 6.8 on 09/20/2022.    Of note, details of recent fracture are as follows: She had a fall on 8/21 with associated neck pain and bilateral weakness. She was admitted at [facility] where CT showed C1-C1 fracture and a small subarachnoid hemorrhage. MRI obtained showed C1 and C2 fracture, ossification of posterior longitudinal ligament, cervical spondylosis, cervical spinal cord injury, central cord syndrome.  Initial plan by neuro surgery at [facility] was for posterior cervical decompression, instrumentation and fusion from C1-C6 to be done on 8/28, but it was felt she would be best served by medically stabilizing and correcting coagulopathy prior surgery.  However on 8/25 family requested she be transferred to [facility] for Neurosurgical intervention there.     She was admitted to the ICU for hypovolemic/hemorrhagic/septic shock and severe anemia.  She was treated with IV fluids, empiric IV antibiotics and IV vasopressors.  She was also transfused with 2 units of packed red blood cells.  Workup also showed that  she had new C1 and C2 dislocation on top of recent C1 and C2 fractures with resultant severe spinal stenosis at C1-C2.  Dr. Katrinka Blazing, neurosurgeon, was consulted to assist with management.  He discussed the case with neurosurgeon at [facility] who recommended nonsurgical approach.   10/8: Patient was previously on full comfort measures however this has been rescinded by the family.  We are attempting to medically optimized and place patient in skilled nursing facility.  As of this date we do not have an accepting facility.  10/12 discussed with patient's daughter [name] again today and she discussed with her siblings and would like to move move forward with comfort measures today.  Palliative care and social worker will be involved, patient may need hospice placement  Assessment and Plan: Hypovolemic/hemorrhagic/septic shock.  Resolved   Autonomic dysfunction in the setting of recent C1 and C2 spine fracture s/p laminectomy at [facility], now with worsening dislocation and significant spinal stenosis Continue cervical collar. 10/9 patient was having discomfort, size needs to be changed.  Discussed with neurosurgery to reeval for cervical collar size. 10/10 cervical collar was changed by neurosurgery today.  DVT right upper extremity and left subclavian vein Right arm swelling, Keep right arm elevated Venous duplex: Nonocclusive deep venous thrombosis is noted in the right subclavian, axillary and brachial veins. Nonocclusive thrombus is also noted in the right basilic vein and left subclavian vein. 10/11 started heparin IV infusion, pharmacy consulted for dosing and PTT monitoring as per protocol Monitor H&H 10/12 low Hb possible bleeding, discontinued heparin IV infusion, transfuse  [facility] Progress Note  Patient: Hannah [name]    ZOX:[id number]  DOA: 09/28/2022     Date of Service: the patient was seen and examined on 10/24/2022  Chief Complaint  Patient presents with   Abnormal Lab    Pt. To ED via EMS from [facility] for abnormal lab, Hgb of 6.8. unknown if any bleeding. Pt. Is baseline nonverbal, diabetic, CHF. Medic states they think pt. Had routine blood draw.   Brief hospital course: 82 y.o. female with past medical history significant for recent C1/C2 fracture and central cord syndrome approximately 1 month ago following a fall at home status post laminectomy at [facility], PE, HFpEF, CKD Stage IIIa, DM Type 2, HTN, paroxsymal A.fib, Hypothyroidism. Of note, she was brought to the emergency department on 09/28/2022 because of bleeding from a PICC line on her right arm. She was brought from the nursing home to the emergency department because of hemoglobin of 6.8 on 09/20/2022.    Of note, details of recent fracture are as follows: She had a fall on 8/21 with associated neck pain and bilateral weakness. She was admitted at [facility] where CT showed C1-C1 fracture and a small subarachnoid hemorrhage. MRI obtained showed C1 and C2 fracture, ossification of posterior longitudinal ligament, cervical spondylosis, cervical spinal cord injury, central cord syndrome.  Initial plan by neuro surgery at [facility] was for posterior cervical decompression, instrumentation and fusion from C1-C6 to be done on 8/28, but it was felt she would be best served by medically stabilizing and correcting coagulopathy prior surgery.  However on 8/25 family requested she be transferred to [facility] for Neurosurgical intervention there.     She was admitted to the ICU for hypovolemic/hemorrhagic/septic shock and severe anemia.  She was treated with IV fluids, empiric IV antibiotics and IV vasopressors.  She was also transfused with 2 units of packed red blood cells.  Workup also showed that  she had new C1 and C2 dislocation on top of recent C1 and C2 fractures with resultant severe spinal stenosis at C1-C2.  Dr. Katrinka Blazing, neurosurgeon, was consulted to assist with management.  He discussed the case with neurosurgeon at [facility] who recommended nonsurgical approach.   10/8: Patient was previously on full comfort measures however this has been rescinded by the family.  We are attempting to medically optimized and place patient in skilled nursing facility.  As of this date we do not have an accepting facility.  10/12 discussed with patient's daughter [name] again today and she discussed with her siblings and would like to move move forward with comfort measures today.  Palliative care and social worker will be involved, patient may need hospice placement  Assessment and Plan: Hypovolemic/hemorrhagic/septic shock.  Resolved   Autonomic dysfunction in the setting of recent C1 and C2 spine fracture s/p laminectomy at [facility], now with worsening dislocation and significant spinal stenosis Continue cervical collar. 10/9 patient was having discomfort, size needs to be changed.  Discussed with neurosurgery to reeval for cervical collar size. 10/10 cervical collar was changed by neurosurgery today.  DVT right upper extremity and left subclavian vein Right arm swelling, Keep right arm elevated Venous duplex: Nonocclusive deep venous thrombosis is noted in the right subclavian, axillary and brachial veins. Nonocclusive thrombus is also noted in the right basilic vein and left subclavian vein. 10/11 started heparin IV infusion, pharmacy consulted for dosing and PTT monitoring as per protocol Monitor H&H 10/12 low Hb possible bleeding, discontinued heparin IV infusion, transfuse

## 2022-11-04 DEATH — deceased
